# Patient Record
Sex: Female | Born: 1985
Health system: Southern US, Community
[De-identification: ages and names within clinical notes are randomized; demographics above are authoritative.]

## PROBLEM LIST (undated history)

## (undated) ENCOUNTER — Inpatient Hospital Stay (HOSPITAL_COMMUNITY): Payer: Self-pay

## (undated) VITALS — BP 142/100 | HR 102 | Temp 97.8°F | Resp 16 | Ht 67.0 in | Wt 202.5 lb

## (undated) DIAGNOSIS — G061 Intraspinal abscess and granuloma: Secondary | ICD-10-CM

## (undated) DIAGNOSIS — F988 Other specified behavioral and emotional disorders with onset usually occurring in childhood and adolescence: Secondary | ICD-10-CM

## (undated) DIAGNOSIS — I10 Essential (primary) hypertension: Secondary | ICD-10-CM

## (undated) DIAGNOSIS — M549 Dorsalgia, unspecified: Secondary | ICD-10-CM

## (undated) DIAGNOSIS — N611 Abscess of the breast and nipple: Secondary | ICD-10-CM

## (undated) DIAGNOSIS — O2300 Infections of kidney in pregnancy, unspecified trimester: Secondary | ICD-10-CM

## (undated) DIAGNOSIS — D72829 Elevated white blood cell count, unspecified: Secondary | ICD-10-CM

## (undated) DIAGNOSIS — M60221 Foreign body granuloma of soft tissue, not elsewhere classified, right upper arm: Secondary | ICD-10-CM

## (undated) DIAGNOSIS — N3000 Acute cystitis without hematuria: Secondary | ICD-10-CM

## (undated) DIAGNOSIS — F1910 Other psychoactive substance abuse, uncomplicated: Secondary | ICD-10-CM

## (undated) DIAGNOSIS — A419 Sepsis, unspecified organism: Secondary | ICD-10-CM

## (undated) DIAGNOSIS — T50902A Poisoning by unspecified drugs, medicaments and biological substances, intentional self-harm, initial encounter: Secondary | ICD-10-CM

## (undated) DIAGNOSIS — F319 Bipolar disorder, unspecified: Secondary | ICD-10-CM

## (undated) HISTORY — DX: Other specified behavioral and emotional disorders with onset usually occurring in childhood and adolescence: F98.8

## (undated) HISTORY — DX: Infections of kidney in pregnancy, unspecified trimester: O23.00

## (undated) HISTORY — DX: Acute cystitis without hematuria: N30.00

## (undated) HISTORY — PX: BACK SURGERY: SHX140

---
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
## 2004-02-22 ENCOUNTER — Emergency Department (HOSPITAL_COMMUNITY): Admission: EM | Admit: 2004-02-22 | Discharge: 2004-02-22 | Payer: Self-pay | Admitting: Emergency Medicine

## 2004-02-29 ENCOUNTER — Emergency Department (HOSPITAL_COMMUNITY): Admission: EM | Admit: 2004-02-29 | Discharge: 2004-02-29 | Payer: Self-pay

## 2004-07-17 ENCOUNTER — Ambulatory Visit (HOSPITAL_COMMUNITY): Admission: RE | Admit: 2004-07-17 | Discharge: 2004-07-17 | Payer: Self-pay | Admitting: Interventional Radiology

## 2005-04-25 ENCOUNTER — Emergency Department (HOSPITAL_COMMUNITY): Admission: EM | Admit: 2005-04-25 | Discharge: 2005-04-25 | Payer: Self-pay | Admitting: Emergency Medicine

## 2005-05-02 ENCOUNTER — Ambulatory Visit: Payer: Self-pay | Admitting: Gastroenterology

## 2005-05-03 ENCOUNTER — Ambulatory Visit: Payer: Self-pay | Admitting: Gastroenterology

## 2005-05-03 ENCOUNTER — Encounter (INDEPENDENT_AMBULATORY_CARE_PROVIDER_SITE_OTHER): Payer: Self-pay | Admitting: Specialist

## 2005-05-03 DIAGNOSIS — K5090 Crohn's disease, unspecified, without complications: Secondary | ICD-10-CM | POA: Insufficient documentation

## 2005-05-15 ENCOUNTER — Encounter: Admission: RE | Admit: 2005-05-15 | Discharge: 2005-05-15 | Payer: Self-pay | Admitting: Gastroenterology

## 2006-04-05 ENCOUNTER — Emergency Department (HOSPITAL_COMMUNITY): Admission: EM | Admit: 2006-04-05 | Discharge: 2006-04-05 | Payer: Self-pay | Admitting: Emergency Medicine

## 2006-05-06 ENCOUNTER — Inpatient Hospital Stay (HOSPITAL_COMMUNITY): Admission: AD | Admit: 2006-05-06 | Discharge: 2006-05-06 | Payer: Self-pay | Admitting: Obstetrics and Gynecology

## 2006-05-09 ENCOUNTER — Inpatient Hospital Stay (HOSPITAL_COMMUNITY): Admission: AD | Admit: 2006-05-09 | Discharge: 2006-05-09 | Payer: Self-pay | Admitting: Family Medicine

## 2006-10-22 ENCOUNTER — Inpatient Hospital Stay (HOSPITAL_COMMUNITY): Admission: AD | Admit: 2006-10-22 | Discharge: 2006-10-22 | Payer: Self-pay | Admitting: Family Medicine

## 2006-11-08 ENCOUNTER — Inpatient Hospital Stay (HOSPITAL_COMMUNITY): Admission: RE | Admit: 2006-11-08 | Discharge: 2006-11-08 | Payer: Self-pay | Admitting: Family Medicine

## 2007-01-30 ENCOUNTER — Inpatient Hospital Stay (HOSPITAL_COMMUNITY): Admission: AD | Admit: 2007-01-30 | Discharge: 2007-01-30 | Payer: Self-pay | Admitting: Obstetrics and Gynecology

## 2007-05-11 ENCOUNTER — Inpatient Hospital Stay (HOSPITAL_COMMUNITY): Admission: AD | Admit: 2007-05-11 | Discharge: 2007-05-11 | Payer: Self-pay | Admitting: Obstetrics & Gynecology

## 2007-05-15 DIAGNOSIS — O139 Gestational [pregnancy-induced] hypertension without significant proteinuria, unspecified trimester: Secondary | ICD-10-CM

## 2007-06-03 ENCOUNTER — Inpatient Hospital Stay (HOSPITAL_COMMUNITY): Admission: AD | Admit: 2007-06-03 | Discharge: 2007-06-09 | Payer: Self-pay | Admitting: Obstetrics and Gynecology

## 2007-06-03 ENCOUNTER — Ambulatory Visit: Payer: Self-pay | Admitting: Obstetrics & Gynecology

## 2007-06-17 ENCOUNTER — Inpatient Hospital Stay (HOSPITAL_COMMUNITY): Admission: AD | Admit: 2007-06-17 | Discharge: 2007-06-17 | Payer: Self-pay | Admitting: Obstetrics and Gynecology

## 2007-06-26 ENCOUNTER — Ambulatory Visit (HOSPITAL_COMMUNITY): Admission: RE | Admit: 2007-06-26 | Discharge: 2007-06-26 | Payer: Self-pay | Admitting: Obstetrics and Gynecology

## 2007-12-03 ENCOUNTER — Ambulatory Visit: Payer: Self-pay | Admitting: Gastroenterology

## 2007-12-19 ENCOUNTER — Telehealth: Payer: Self-pay | Admitting: Gastroenterology

## 2007-12-24 ENCOUNTER — Ambulatory Visit: Payer: Self-pay | Admitting: Gastroenterology

## 2008-02-18 ENCOUNTER — Telehealth: Payer: Self-pay | Admitting: Gastroenterology

## 2008-03-07 ENCOUNTER — Emergency Department (HOSPITAL_COMMUNITY): Admission: EM | Admit: 2008-03-07 | Discharge: 2008-03-07 | Payer: Self-pay | Admitting: Emergency Medicine

## 2008-03-08 ENCOUNTER — Ambulatory Visit: Payer: Self-pay | Admitting: Gastroenterology

## 2008-07-18 ENCOUNTER — Emergency Department (HOSPITAL_BASED_OUTPATIENT_CLINIC_OR_DEPARTMENT_OTHER): Admission: EM | Admit: 2008-07-18 | Discharge: 2008-07-18 | Payer: Self-pay | Admitting: Emergency Medicine

## 2008-07-18 ENCOUNTER — Ambulatory Visit: Payer: Self-pay | Admitting: Radiology

## 2008-07-31 ENCOUNTER — Emergency Department (HOSPITAL_COMMUNITY): Admission: EM | Admit: 2008-07-31 | Discharge: 2008-07-31 | Payer: Self-pay | Admitting: Emergency Medicine

## 2008-08-27 ENCOUNTER — Telehealth: Payer: Self-pay | Admitting: Gastroenterology

## 2008-09-06 ENCOUNTER — Telehealth: Payer: Self-pay | Admitting: Gastroenterology

## 2008-09-09 ENCOUNTER — Encounter: Payer: Self-pay | Admitting: Gastroenterology

## 2008-09-17 ENCOUNTER — Ambulatory Visit: Payer: Self-pay | Admitting: Diagnostic Radiology

## 2008-09-17 ENCOUNTER — Emergency Department (HOSPITAL_BASED_OUTPATIENT_CLINIC_OR_DEPARTMENT_OTHER): Admission: EM | Admit: 2008-09-17 | Discharge: 2008-09-17 | Payer: Self-pay | Admitting: Emergency Medicine

## 2009-03-03 ENCOUNTER — Inpatient Hospital Stay (HOSPITAL_COMMUNITY): Admission: AD | Admit: 2009-03-03 | Discharge: 2009-03-03 | Payer: Self-pay | Admitting: Obstetrics & Gynecology

## 2009-08-22 ENCOUNTER — Ambulatory Visit: Payer: Self-pay | Admitting: Diagnostic Radiology

## 2009-08-22 ENCOUNTER — Emergency Department (HOSPITAL_BASED_OUTPATIENT_CLINIC_OR_DEPARTMENT_OTHER): Admission: EM | Admit: 2009-08-22 | Discharge: 2009-08-22 | Payer: Self-pay | Admitting: Emergency Medicine

## 2009-11-23 ENCOUNTER — Inpatient Hospital Stay (HOSPITAL_COMMUNITY)
Admission: AD | Admit: 2009-11-23 | Discharge: 2009-11-23 | Payer: Self-pay | Source: Home / Self Care | Admitting: Obstetrics and Gynecology

## 2009-12-25 ENCOUNTER — Ambulatory Visit (HOSPITAL_COMMUNITY)
Admission: AD | Admit: 2009-12-25 | Discharge: 2009-12-25 | Payer: Self-pay | Source: Home / Self Care | Admitting: Obstetrics and Gynecology

## 2009-12-25 DIAGNOSIS — O034 Incomplete spontaneous abortion without complication: Secondary | ICD-10-CM

## 2010-04-10 ENCOUNTER — Ambulatory Visit: Payer: Self-pay | Admitting: Diagnostic Radiology

## 2010-04-10 ENCOUNTER — Emergency Department (HOSPITAL_BASED_OUTPATIENT_CLINIC_OR_DEPARTMENT_OTHER)
Admission: EM | Admit: 2010-04-10 | Discharge: 2010-04-10 | Payer: Self-pay | Source: Home / Self Care | Admitting: Emergency Medicine

## 2010-04-15 ENCOUNTER — Emergency Department (HOSPITAL_BASED_OUTPATIENT_CLINIC_OR_DEPARTMENT_OTHER)
Admission: EM | Admit: 2010-04-15 | Discharge: 2010-04-15 | Payer: Self-pay | Source: Home / Self Care | Admitting: Emergency Medicine

## 2010-07-28 LAB — URINALYSIS, ROUTINE W REFLEX MICROSCOPIC
Bilirubin Urine: NEGATIVE
Glucose, UA: NEGATIVE mg/dL
Ketones, ur: NEGATIVE mg/dL
pH: 7 (ref 5.0–8.0)

## 2010-07-28 LAB — URINE MICROSCOPIC-ADD ON

## 2010-07-28 LAB — ABO/RH: ABO/RH(D): O POS

## 2010-07-28 LAB — CBC
HCT: 34.5 % — ABNORMAL LOW (ref 36.0–46.0)
Hemoglobin: 11.9 g/dL — ABNORMAL LOW (ref 12.0–15.0)
WBC: 12.9 10*3/uL — ABNORMAL HIGH (ref 4.0–10.5)

## 2010-07-30 LAB — URINALYSIS, ROUTINE W REFLEX MICROSCOPIC
Glucose, UA: NEGATIVE mg/dL
Nitrite: NEGATIVE
Protein, ur: NEGATIVE mg/dL
pH: 5.5 (ref 5.0–8.0)

## 2010-07-30 LAB — CBC
HCT: 38.3 % (ref 36.0–46.0)
Hemoglobin: 13.3 g/dL (ref 12.0–15.0)
MCV: 96.6 fL (ref 78.0–100.0)
RDW: 12.4 % (ref 11.5–15.5)
WBC: 11.5 10*3/uL — ABNORMAL HIGH (ref 4.0–10.5)

## 2010-07-30 LAB — WET PREP, GENITAL
Trich, Wet Prep: NONE SEEN
Yeast Wet Prep HPF POC: NONE SEEN

## 2010-07-30 LAB — ABO/RH: ABO/RH(D): O POS

## 2010-07-30 LAB — GC/CHLAMYDIA PROBE AMP, GENITAL: GC Probe Amp, Genital: NEGATIVE

## 2010-08-24 LAB — BASIC METABOLIC PANEL
BUN: 9 mg/dL (ref 6–23)
Calcium: 9.6 mg/dL (ref 8.4–10.5)
Chloride: 112 mEq/L (ref 96–112)
GFR calc Af Amer: >60 mL/min (ref >60)
GFR calc Af Amer: >60 mL/min (ref >60)
GFR calc non Af Amer: >60 mL/min (ref >60)
GFR calc non Af Amer: >60 mL/min (ref >60)
Potassium: 4.2 mEq/L (ref 3.5–5.1)
Potassium: 4.1 mEq/L (ref 3.5–5.1)
Sodium: 138 mEq/L (ref 135–145)
Sodium: 147 mEq/L — ABNORMAL HIGH (ref 135–145)

## 2010-08-24 LAB — URINALYSIS, ROUTINE W REFLEX MICROSCOPIC
Bilirubin Urine: NEGATIVE
Glucose, UA: NEGATIVE mg/dL
Hgb urine dipstick: NEGATIVE
Ketones, ur: NEGATIVE mg/dL
Nitrite: NEGATIVE
Protein, ur: NEGATIVE mg/dL
Specific Gravity, Urine: 1.004 — ABNORMAL LOW (ref 1.005–1.030)
Urobilinogen, UA: 0.2 mg/dL (ref 0.0–1.0)
pH: 5.5 (ref 5.0–8.0)

## 2010-08-24 LAB — WOUND CULTURE
Culture: NO GROWTH
Gram Stain: NONE SEEN

## 2010-08-24 LAB — PREGNANCY, URINE: Preg Test, Ur: NEGATIVE

## 2010-08-31 ENCOUNTER — Emergency Department (HOSPITAL_BASED_OUTPATIENT_CLINIC_OR_DEPARTMENT_OTHER)
Admission: EM | Admit: 2010-08-31 | Discharge: 2010-08-31 | Disposition: A | Discharge status: Home / Self Care | Payer: 59 | Attending: Emergency Medicine | Admitting: Emergency Medicine

## 2010-08-31 ENCOUNTER — Emergency Department (INDEPENDENT_AMBULATORY_CARE_PROVIDER_SITE_OTHER): Payer: 59

## 2010-08-31 DIAGNOSIS — F988 Other specified behavioral and emotional disorders with onset usually occurring in childhood and adolescence: Secondary | ICD-10-CM | POA: Insufficient documentation

## 2010-08-31 DIAGNOSIS — W108XXA Fall (on) (from) other stairs and steps, initial encounter: Secondary | ICD-10-CM | POA: Insufficient documentation

## 2010-08-31 DIAGNOSIS — K5090 Crohn's disease, unspecified, without complications: Secondary | ICD-10-CM | POA: Insufficient documentation

## 2010-08-31 DIAGNOSIS — M25569 Pain in unspecified knee: Secondary | ICD-10-CM

## 2010-08-31 DIAGNOSIS — IMO0002 Reserved for concepts with insufficient information to code with codable children: Secondary | ICD-10-CM | POA: Insufficient documentation

## 2010-08-31 DIAGNOSIS — Y92009 Unspecified place in unspecified non-institutional (private) residence as the place of occurrence of the external cause: Secondary | ICD-10-CM | POA: Insufficient documentation

## 2010-09-01 ENCOUNTER — Ambulatory Visit: Payer: 59 | Attending: Orthopaedic Surgery | Admitting: Physical Therapy

## 2010-09-03 ENCOUNTER — Emergency Department (HOSPITAL_COMMUNITY)
Admission: EM | Admit: 2010-09-03 | Discharge: 2010-09-03 | Disposition: A | Discharge status: Home / Self Care | Payer: 59 | Attending: Emergency Medicine | Admitting: Emergency Medicine

## 2010-09-03 ENCOUNTER — Emergency Department (HOSPITAL_COMMUNITY): Payer: 59

## 2010-09-03 DIAGNOSIS — M25569 Pain in unspecified knee: Secondary | ICD-10-CM | POA: Insufficient documentation

## 2010-09-03 DIAGNOSIS — W19XXXA Unspecified fall, initial encounter: Secondary | ICD-10-CM | POA: Insufficient documentation

## 2010-09-03 DIAGNOSIS — Y92009 Unspecified place in unspecified non-institutional (private) residence as the place of occurrence of the external cause: Secondary | ICD-10-CM | POA: Insufficient documentation

## 2010-09-04 ENCOUNTER — Other Ambulatory Visit (HOSPITAL_COMMUNITY): Payer: Self-pay | Admitting: Specialist

## 2010-09-04 ENCOUNTER — Ambulatory Visit (HOSPITAL_COMMUNITY)
Admission: RE | Admit: 2010-09-04 | Discharge: 2010-09-04 | Disposition: A | Discharge status: Home / Self Care | Payer: 59 | Source: Ambulatory Visit | Attending: Specialist | Admitting: Specialist

## 2010-09-04 DIAGNOSIS — R52 Pain, unspecified: Secondary | ICD-10-CM

## 2010-09-04 DIAGNOSIS — W19XXXA Unspecified fall, initial encounter: Secondary | ICD-10-CM | POA: Insufficient documentation

## 2010-09-04 DIAGNOSIS — S82109A Unspecified fracture of upper end of unspecified tibia, initial encounter for closed fracture: Secondary | ICD-10-CM | POA: Insufficient documentation

## 2010-09-05 ENCOUNTER — Other Ambulatory Visit (HOSPITAL_COMMUNITY): Payer: Self-pay | Admitting: Specialist

## 2010-09-05 DIAGNOSIS — S83519A Sprain of anterior cruciate ligament of unspecified knee, initial encounter: Secondary | ICD-10-CM

## 2010-09-05 DIAGNOSIS — M25562 Pain in left knee: Secondary | ICD-10-CM

## 2010-09-26 NOTE — Op Note (Signed)
Kelly [name], Kelly [name]            ACCOUNT NO.:  [id number]   MEDICAL RECORD NO.:  [id number]          PATIENT TYPE:  INP   LOCATION:  9121                          FACILITY:  [facility]   PHYSICIAN:  Daleen Bo. Gaetano Net, M.D. DATE OF BIRTH:  [birth year]   DATE OF PROCEDURE:  06/05/2007  DATE OF DISCHARGE:  06/09/2007                               OPERATIVE REPORT   PREOPERATIVE DIAGNOSIS:  1. Intrauterine pregnancy at 40 weeks estimated gestational age.  2. Arrest of cervical dilation.   POSTOPERATIVE DIAGNOSIS:  1. Intrauterine pregnancy at 60 weeks estimated gestational age.  2. Arrest of cervical dilation.   PROCEDURE:  Low-transverse cesarean section.   SURGEON:  Dr. Everlene Farrier.   ANESTHESIA:  Spinal anesthetic, Josephine Igo, MD.   ESTIMATED BLOOD LOSS:  800 mL.   FINDINGS:  Viable female infant, Apgars of 8 and 9 at 1 and 5 minutes  respectively.  Birth weight 7 pounds 14 ounces.  Arterial cord pH 7.31.   INDICATIONS AND CONSENTS:  This patient is a 25 year old, G2, P0 with an  EDC of 127 who presents for induction of labor secondary to back pain  from Harrington rods.  She had also been noted to have mild elevations  of blood pressure.  She underwent a two-stage induction of labor.  Artificial rupture of membranes was carried out for clear fluid on  June 05, 2007 at 1:45 p.m.  An intrauterine pressure catheter was  placed.  She progressed to 4 cm dilation,  90% effacement, -2 station.  With adequate labor, there was no change after at least 3 hours of  labor. Diagnosis of arrest of cervical dilation was made.  A  recommendation for cesarean section was made.  The potential risks and  complications were discussed preoperatively including but not limited to  infection, organ damage, bleeding with transfusion, HIV and hepatitis  acquisition, DVT, PE, pneumonia.  All questions were answered and  consent is signed on the chart.   DESCRIPTION OF PROCEDURE:  The patient is  taken to operating room where  she is identified, spinal anesthetic is placed and she is placed in the  dorsal supine position with a 15 degree left lateral wedge.  She is  prepped, Foley catheter is placed, the bladder to drained and she is  draped in a sterile fashion.  After testing for adequate spinal  anesthesia, the skin is entered through a Pfannenstiel incision and  dissection is carried out in layers to the peritoneum.  The peritoneum  is incised and extended superiorly and inferiorly. The vesicouterine  peritoneum is taken down cephalolaterally, the bladder flap is  developed, the bladder blade is placed.  The uterus is incised in a low  transverse manner.  The uterine cavity is entered bluntly with a  hemostat.  The uterine incision is extended cephalolaterally with the  fingers.  The Vertex is then delivered and the oronasal pharynx are  suctioned.  The remainder of the baby is delivered.  Good cry and tone  is noted and the baby is handed to the waiting pediatric team. The  placenta is manually delivered and  the uterus is clean.  The uterus is  closed in two running locking imbricating layers of #0 Monocryl suture  which achieves good hemostasis.  The tubes and ovaries are normal  bilaterally.  The anterior peritoneum is was closed in running fashion  with #0 Monocryl suture which was also used to reapproximate the  pyramidalis muscle in the midline.  The anterior rectus fascia is closed  in running fashion with a #0 PDS suture and the skin is closed with  clips.  All sponge, instrument and needle counts are correct and the  patient is transferred to the recovery room in stable condition.      Daleen Bo Gaetano Net, M.D.  Electronically Signed     JET/MEDQ  D:  07/21/2007  T:  07/22/2007  Job:  [id number]

## 2010-09-26 NOTE — Letter (Signed)
September 09, 2008     RE:  Kelly [name], Kelly [name]  MRN:  [id number]  /  DOB:  Oct 21, 1985   To Whom It May Concern:   Ms. Hellickson is under my care for Crohn's disease.  This is a chronic  condition characterized by episodic flare-ups of colitis.  During these  episodes the patient may become partially incapacitated because of  abdominal pain and diarrhea.  It may be expected that she will be unable  to attend school during these episodes.   Please provide any accommodation that she may require.    Sincerely,      Sandy Salaam. Deatra Ina, MD,FACG  Electronically Signed    RDK/MedQ  DD: 09/09/2008  DT: 09/09/2008  Job #: [id number]
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
## 2010-09-29 NOTE — Discharge Summary (Signed)
Kelly [name], Kelly [name]            ACCOUNT NO.:  [id number]   MEDICAL RECORD NO.:  [id number]          PATIENT TYPE:  INP   LOCATION:  9121                          FACILITY:  [facility]   PHYSICIAN:  Marylynn [name], MD    DATE OF BIRTH:  07-May-1986   DATE OF ADMISSION:  06/03/2007  DATE OF DISCHARGE:  06/09/2007                               DISCHARGE SUMMARY   ADMISSION DIAGNOSES:  1. Intrauterine pregnancy at term.  2. Induction of labor secondary to back pain from history of      Harrington rod placement.   DISCHARGE DIAGNOSES:  1. Status post low transverse cesarean section secondary to failure to      progress.  2. Viable female infant.   PROCEDURE:  Primary low transverse cesarean section.   REASON FOR ADMISSION:  Please see written H&P.   HOSPITAL COURSE:  The patient is 21-year, gravida 2, para 0, that was  admitted to [facility] for induction of labor secondary  to low back pain and history of Harrington rod placement. On the morning  of admission, vital signs were stable with blood pressure 150/80.  Fetal  heart tones were reactive with heart rate of 142.  Cervix was closed on  admission, vertex presentation. Anesthesia was called in for epidural  placement which was placed without difficulty. The patient did progress  through her labor, and artificial rupture of membranes was performed  which revealed clear fluid.  Intrauterine pressure catheter was placed  for monitoring of her labor.  The patient was started on Pitocin.  However, she never progressed beyond 4 cm in dilatation.   The patient was then transferred to the operating room where epidural  was dosed to an adequate surgical level.  Low transverse incision was  made with delivery of a viable female infant weighing 7 pounds 14 ounces,  Apgars of 8 at 1 minute and 9 at 5 minutes.  The patient tolerated  procedure well and taken to the recovery room in stable condition.   On postoperative day #1, the  patient was without complaint.  Vital signs  were stable.  She was afebrile.  Abdomen soft with good return of bowel  function.   On postoperative day #2, the patient did complain of some gas pain.  Abdomen slightly distended, but she had good bowel sounds.  Abdominal  dressing had been removed revealing an incision that was clean, dry and  intact.  The patient was ambulating well, and rectal suppository was  given.   On postoperative day #3, the patient did continue to complain of some  low abdominal pain.  She was breast feeding, complaining of some nipple  soreness.  Vital signs were stable.  Abdomen was soft, slightly tender.  Uterus was firm.  Incision was clean, dry and intact.   On postoperative day #4, the patient stated that the pain medication  seemed to be working better.  She was now passing gas without  difficulty.  Incision was clean, dry and intact.  Staples were removed,  and the patient was later discharged home.   CONDITION ON DISCHARGE:  Good.  DIET:  Regular as tolerated.   ACTIVITY:  No heavy lifting, no driving x2 weeks.  No vaginal entry.   FOLLOWUP:  Patient to follow up in the office in 1-2 weeks for incision  check.  She is to call for temperature greater than 100 degrees,  persistent nausea, vomiting, heavy vaginal bleeding and/or redness or  drainage from incisional site.   DISCHARGE MEDICATIONS:  1. Tylox, #30, one p.o. every 4-6 hours p.r.n.  2. Motrin 600 mg every 6 hours.  3. Prenatal vitamins one p.o. daily.  4. Colace 1 p.o. daily.      Kelly [name], N.P.      Marylynn [name], MD  Electronically Signed    CC/MEDQ  D:  06/27/2007  T:  06/28/2007  Job:  [id number]

## 2011-02-01 LAB — COMPREHENSIVE METABOLIC PANEL
AST: 22
CO2: 20
Calcium: 9.7
Creatinine, Ser: 0.58
GFR calc Af Amer: >60
GFR calc non Af Amer: >60
Glucose, Bld: 108 — ABNORMAL HIGH
Sodium: 133 — ABNORMAL LOW
Total Protein: 5.9 — ABNORMAL LOW

## 2011-02-01 LAB — CBC
HCT: 34 — ABNORMAL LOW
Hemoglobin: 11.9 — ABNORMAL LOW
Hemoglobin: 12.8
Hemoglobin: 9.7 — ABNORMAL LOW
MCHC: 34.3
MCHC: 34.7
MCV: 92.6
Platelets: 234
Platelets: 277
Platelets: 346
RDW: 13.7
RDW: 13.7
RDW: 13.8

## 2011-02-01 LAB — URIC ACID: Uric Acid, Serum: 6.1

## 2011-02-02 LAB — COMPREHENSIVE METABOLIC PANEL
ALT: 12
AST: 21
Alkaline Phosphatase: 115
CO2: 24
Calcium: 9.2
GFR calc Af Amer: >60
Potassium: 3.8
Sodium: 140
Total Protein: 7

## 2011-02-02 LAB — CBC
MCHC: 34.4
RBC: 3.44 — ABNORMAL LOW
RDW: 13.6

## 2011-02-02 LAB — URINALYSIS, DIPSTICK ONLY
Glucose, UA: NEGATIVE
Ketones, ur: NEGATIVE
Protein, ur: 100 — AB
pH: 5.5

## 2011-02-12 LAB — URINALYSIS, ROUTINE W REFLEX MICROSCOPIC
Bilirubin Urine: NEGATIVE
Glucose, UA: NEGATIVE
Hgb urine dipstick: NEGATIVE
Nitrite: NEGATIVE
Specific Gravity, Urine: 1.019
pH: 6.5

## 2011-02-12 LAB — POCT PREGNANCY, URINE: Preg Test, Ur: NEGATIVE

## 2011-02-12 LAB — URINE MICROSCOPIC-ADD ON

## 2011-02-22 LAB — URINALYSIS, ROUTINE W REFLEX MICROSCOPIC
Bilirubin Urine: NEGATIVE
Nitrite: NEGATIVE
Specific Gravity, Urine: 1.015
Urobilinogen, UA: 0.2

## 2011-03-01 LAB — GC/CHLAMYDIA PROBE AMP, GENITAL
Chlamydia, DNA Probe: NEGATIVE
GC Probe Amp, Genital: NEGATIVE

## 2011-03-01 LAB — URINALYSIS, ROUTINE W REFLEX MICROSCOPIC
Hgb urine dipstick: NEGATIVE
Protein, ur: NEGATIVE
Urobilinogen, UA: 0.2

## 2011-03-01 LAB — WET PREP, GENITAL: Trich, Wet Prep: NONE SEEN

## 2011-03-01 LAB — POCT PREGNANCY, URINE: Preg Test, Ur: POSITIVE

## 2011-03-27 ENCOUNTER — Emergency Department (INDEPENDENT_AMBULATORY_CARE_PROVIDER_SITE_OTHER): Payer: Medicaid Other

## 2011-03-27 ENCOUNTER — Encounter: Payer: Self-pay | Admitting: *Deleted

## 2011-03-27 ENCOUNTER — Emergency Department (HOSPITAL_BASED_OUTPATIENT_CLINIC_OR_DEPARTMENT_OTHER)
Admission: EM | Admit: 2011-03-27 | Discharge: 2011-03-27 | Disposition: A | Discharge status: Home / Self Care | Payer: Medicaid Other | Attending: Emergency Medicine | Admitting: Emergency Medicine

## 2011-03-27 DIAGNOSIS — N760 Acute vaginitis: Secondary | ICD-10-CM | POA: Insufficient documentation

## 2011-03-27 DIAGNOSIS — R1032 Left lower quadrant pain: Secondary | ICD-10-CM

## 2011-03-27 DIAGNOSIS — R112 Nausea with vomiting, unspecified: Secondary | ICD-10-CM

## 2011-03-27 DIAGNOSIS — A499 Bacterial infection, unspecified: Secondary | ICD-10-CM | POA: Insufficient documentation

## 2011-03-27 DIAGNOSIS — B9689 Other specified bacterial agents as the cause of diseases classified elsewhere: Secondary | ICD-10-CM | POA: Insufficient documentation

## 2011-03-27 DIAGNOSIS — R109 Unspecified abdominal pain: Secondary | ICD-10-CM

## 2011-03-27 DIAGNOSIS — K8020 Calculus of gallbladder without cholecystitis without obstruction: Secondary | ICD-10-CM

## 2011-03-27 HISTORY — DX: Essential (primary) hypertension: I10

## 2011-03-27 LAB — WET PREP, GENITAL
Trich, Wet Prep: NONE SEEN
Yeast Wet Prep HPF POC: NONE SEEN

## 2011-03-27 LAB — BASIC METABOLIC PANEL
BUN: 11 mg/dL (ref 6–23)
CO2: 28 mEq/L (ref 19–32)
Calcium: 10.5 mg/dL (ref 8.4–10.5)
GFR calc non Af Amer: >90 mL/min (ref >90)
Glucose, Bld: 99 mg/dL (ref 70–99)
Sodium: 137 mEq/L (ref 135–145)

## 2011-03-27 LAB — URINALYSIS, ROUTINE W REFLEX MICROSCOPIC
Bilirubin Urine: NEGATIVE
Hgb urine dipstick: NEGATIVE
Protein, ur: NEGATIVE mg/dL
Urobilinogen, UA: 0.2 mg/dL (ref 0.0–1.0)

## 2011-03-27 LAB — CBC
HCT: 41.7 % (ref 36.0–46.0)
Hemoglobin: 14 g/dL (ref 12.0–15.0)
MCH: 32.3 pg (ref 26.0–34.0)
MCHC: 33.6 g/dL (ref 30.0–36.0)
MCV: 96.3 fL (ref 78.0–100.0)
RBC: 4.33 MIL/uL (ref 3.87–5.11)

## 2011-03-27 MED ORDER — IOHEXOL 300 MG/ML  SOLN
100.0000 mL | Freq: Once | INTRAMUSCULAR | Status: AC | PRN
  Administered 2011-03-27: 100 mL via INTRAVENOUS

## 2011-03-27 MED ORDER — MORPHINE SULFATE 4 MG/ML IJ SOLN
4.0000 mg | Freq: Once | INTRAMUSCULAR | Status: AC
  Administered 2011-03-27: 4 mg via INTRAVENOUS
  Filled 2011-03-27: qty 1

## 2011-03-27 MED ORDER — METRONIDAZOLE 500 MG PO TABS: 500.0000 mg | ORAL_TABLET | Freq: Two times a day (BID) | ORAL | Status: AC

## 2011-03-27 MED ORDER — OXYCODONE-ACETAMINOPHEN 5-325 MG PO TABS: 2.0000 | ORAL_TABLET | ORAL | Status: AC | PRN

## 2011-03-27 MED ORDER — ONDANSETRON HCL 4 MG/2ML IJ SOLN
4.0000 mg | Freq: Once | INTRAMUSCULAR | Status: AC
  Administered 2011-03-27: 4 mg via INTRAVENOUS
  Filled 2011-03-27: qty 2

## 2011-03-27 MED ORDER — OXYCODONE-ACETAMINOPHEN 5-325 MG PO TABS
1.0000 | ORAL_TABLET | Freq: Once | ORAL | Status: AC
  Administered 2011-03-27: 1 via ORAL
  Filled 2011-03-27: qty 1

## 2011-03-27 NOTE — ED Notes (Signed)
In ultrasound

## 2011-03-27 NOTE — ED Notes (Signed)
Pt reports she has had ovarian cysts in the past and this feels similar to that states she has vomited a couple of times today but she feels like it is due to the pain

## 2011-03-27 NOTE — ED Notes (Signed)
0500 yesterday pt woke with severe lower left abdominal pain reports nausea and vomiting x 2 today denies diarrhea . Denies any s/s uti does report vaginal discharge

## 2011-03-27 NOTE — ED Provider Notes (Signed)
History     CSN: [id number] Arrival date & time: 03/27/2011  1:18 PM   First MD Initiated Contact with Patient 03/27/11 1331      Chief Complaint  Patient presents with  . Abdominal Pain    (Consider location/radiation/quality/duration/timing/severity/associated sxs/prior treatment) HPI Comments: Pt states that they thought she had crohns but she doesn't:pt states that this feels similar to previous cysts  Patient is a 25 y.o. female presenting with abdominal pain. The history is provided by the patient. No language interpreter was used.  Abdominal Pain The primary symptoms of the illness include abdominal pain, nausea and vomiting. The primary symptoms of the illness do not include dysuria or vaginal discharge. The current episode started yesterday. The onset of the illness was sudden. The problem has not changed since onset. The patient states that she believes she is currently not pregnant. The patient has not had a change in bowel habit. Symptoms associated with the illness do not include urgency or frequency.    Past Medical History  Diagnosis Date  . Hypertension     Past Surgical History  Procedure Date  . Cesarean section   . Back surgery     History reviewed. No pertinent family history.  History  Substance Use Topics  . Smoking status: Current Everyday Smoker -- 0.5 packs/day  . Smokeless tobacco: Never Used  . Alcohol Use: Yes     occ    OB History    Grav Para Term Preterm Abortions TAB SAB Ect Mult Living                  Review of Systems  Gastrointestinal: Positive for nausea, vomiting and abdominal pain.  Genitourinary: Negative for dysuria, urgency, frequency and vaginal discharge.  All other systems reviewed and are negative.    Allergies  Review of patient's allergies indicates no known allergies.  Home Medications  No current outpatient prescriptions on file.  BP 131/78  Pulse 73  Temp(Src) 97.9 F (36.6 C) (Oral)  Resp 20  Wt  180 lb (81.647 kg)  SpO2 100%  LMP 03/14/2011  Physical Exam  Nursing note and vitals reviewed. Constitutional: She is oriented to person, place, and time. She appears well-developed and well-nourished.  HENT:  Head: Normocephalic and atraumatic.  Cardiovascular: Normal rate and regular rhythm.   Pulmonary/Chest: Effort normal and breath sounds normal.  Abdominal: Soft. There is tenderness in the left lower quadrant.  Genitourinary: Vaginal discharge found.       White vaginal discharge  Musculoskeletal: Normal range of motion.  Neurological: She is alert and oriented to person, place, and time.  Skin: Skin is warm and dry.  Psychiatric: She has a normal mood and affect.    ED Course  Procedures (including critical care time)  Labs Reviewed  URINALYSIS, ROUTINE W REFLEX MICROSCOPIC - Abnormal; Notable for the following:    Ketones, ur 15 (*)    All other components within normal limits  WET PREP, GENITAL - Abnormal; Notable for the following:    Clue Cells, Wet Prep MANY (*)    WBC, Wet Prep HPF POC MODERATE (*)    All other components within normal limits  CBC - Abnormal; Notable for the following:    WBC 11.2 (*)    Platelets 407 (*)    All other components within normal limits  PREGNANCY, URINE  BASIC METABOLIC PANEL  GC/CHLAMYDIA PROBE AMP, GENITAL  RPR   US Transvaginal Non-ob  03/27/2011  *RADIOLOGY REPORT*  Clinical  Data: Left lower quadrant pain for 1 day with nausea and vomiting.  TRANSABDOMINAL AND TRANSVAGINAL ULTRASOUND OF PELVIS Technique:  Both transabdominal and transvaginal ultrasound examinations of the pelvis were performed. Transabdominal technique was performed for global imaging of the pelvis including uterus, ovaries, adnexal regions, and pelvic cul-de-sac.  Comparison: Pelvic ultrasound 12/25/2009   It was necessary to proceed with endovaginal exam following the transabdominal exam to visualize the uterus, endometrium, ovaries and adnexa.  Findings:   Uterus: Normal in size and appearance.  Anteverted.  Measures 5.7 x 3.3 x 4.1 cm.  No focal uterine mass.  Endometrium: Normal in thickness and appearance.  Measures 7 mm in thickness.  Right ovary:  Normal appearance/no adnexal mass.  3.0 x 2.0 x 2.2 cm.  Left ovary: Normal appearance/no adnexal mass.  3.3 x 2.3 x 2.3 cm.  Other findings: No free fluid  IMPRESSION: Normal study. No evidence of pelvic mass or other significant abnormality.  Original Report Authenticated By: Curlene Dolphin, M.D.   US Pelvis Complete  03/27/2011  *RADIOLOGY REPORT*  Clinical Data: Left lower quadrant pain for 1 day with nausea and vomiting.  TRANSABDOMINAL AND TRANSVAGINAL ULTRASOUND OF PELVIS Technique:  Both transabdominal and transvaginal ultrasound examinations of the pelvis were performed. Transabdominal technique was performed for global imaging of the pelvis including uterus, ovaries, adnexal regions, and pelvic cul-de-sac.  Comparison: Pelvic ultrasound 12/25/2009   It was necessary to proceed with endovaginal exam following the transabdominal exam to visualize the uterus, endometrium, ovaries and adnexa.  Findings:  Uterus: Normal in size and appearance.  Anteverted.  Measures 5.7 x 3.3 x 4.1 cm.  No focal uterine mass.  Endometrium: Normal in thickness and appearance.  Measures 7 mm in thickness.  Right ovary:  Normal appearance/no adnexal mass.  3.0 x 2.0 x 2.2 cm.  Left ovary: Normal appearance/no adnexal mass.  3.3 x 2.3 x 2.3 cm.  Other findings: No free fluid  IMPRESSION: Normal study. No evidence of pelvic mass or other significant abnormality.  Original Report Authenticated By: Curlene Dolphin, M.D.   Ct Abdomen Pelvis W Contrast  03/27/2011  *RADIOLOGY REPORT*  Clinical Data: Abdominal pain.  CT ABDOMEN AND PELVIS WITH CONTRAST  Technique:  Multidetector CT imaging of the abdomen and pelvis was performed following the standard protocol during bolus administration of intravenous contrast.  Contrast: 17m OMNIPAQUE  IOHEXOL 300 MG/ML IV SOLN  Comparison: CT scan 07/31/2008.  Findings: The lung bases are clear.  The liver is normal.  No focal lesions or biliary dilatation.  The mild diffuse fatty infiltration is suspected.  The spleen is normal in size.  No focal lesions.  The pancreas is normal.  The adrenal glands and kidneys are normal.  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  The gallbladder demonstrates numerous gallstones.  No pericholecystic inflammatory changes, wall thickening or pericholecystic fluid to suggest acute cholecystitis.  The stomach, duodenum, small bowel and colon are grossly normal. The appendix is normal.  The uterus and ovaries are unremarkable except for small cysts. The bladder is normal.  No pelvic mass, adenopathy or free pelvic fluid collections.  The aorta is normal in caliber.  The major branch vessels are normal.  No mesenteric or retroperitoneal masses or lymphadenopathy.  The bony structures are intact.  Surgical changes related to a scoliosis and Harrington rods.  No obvious complicating features.  IMPRESSION:  1.  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 2.  No acute abdominal/pelvic findings, mass lesions or  adenopathy. 3.  Cholelithiasis without CT findings to suggest acute cholecystitis.  Original Report Authenticated By: P. Kalman Jewels, M.D.     1. Bacterial vaginosis   2. Abdominal pain       MDM  No acute findings noted:will treat with something for pain and bv        Glendell Docker, NP 03/27/11 1745

## 2011-03-29 NOTE — ED Provider Notes (Signed)
Medical screening examination/treatment/procedure(s) were performed by non-physician practitioner and as supervising physician I was immediately available for consultation/collaboration.  Carlisle Beers, MD 03/29/11 [phone]

## 2011-07-18 ENCOUNTER — Emergency Department (HOSPITAL_COMMUNITY)
Admission: EM | Admit: 2011-07-18 | Discharge: 2011-07-18 | Disposition: A | Discharge status: Home / Self Care | Payer: Medicaid Other | Attending: Emergency Medicine | Admitting: Emergency Medicine

## 2011-07-18 ENCOUNTER — Encounter (HOSPITAL_COMMUNITY): Payer: Self-pay | Admitting: Family Medicine

## 2011-07-18 DIAGNOSIS — I10 Essential (primary) hypertension: Secondary | ICD-10-CM | POA: Insufficient documentation

## 2011-07-18 DIAGNOSIS — N3090 Cystitis, unspecified without hematuria: Secondary | ICD-10-CM | POA: Insufficient documentation

## 2011-07-18 DIAGNOSIS — F17200 Nicotine dependence, unspecified, uncomplicated: Secondary | ICD-10-CM | POA: Insufficient documentation

## 2011-07-18 LAB — URINALYSIS, ROUTINE W REFLEX MICROSCOPIC
Bilirubin Urine: NEGATIVE
Glucose, UA: NEGATIVE mg/dL
Protein, ur: 100 mg/dL — AB
Urobilinogen, UA: 1 mg/dL (ref 0.0–1.0)

## 2011-07-18 LAB — URINE MICROSCOPIC-ADD ON

## 2011-07-18 MED ORDER — CEPHALEXIN 500 MG PO CAPS: 500.0000 mg | ORAL_CAPSULE | Freq: Four times a day (QID) | ORAL | Status: AC

## 2011-07-18 MED ORDER — PHENAZOPYRIDINE HCL 200 MG PO TABS: 200.0000 mg | ORAL_TABLET | Freq: Three times a day (TID) | ORAL | Status: AC

## 2011-07-18 NOTE — ED Notes (Signed)
Per pt woke up this am and having hematuria and dysuria

## 2011-07-18 NOTE — ED Provider Notes (Signed)
History     CSN: [id number]  Arrival date & time 07/18/11  7829   First MD Initiated Contact with Patient 07/18/11 1105      Chief Complaint  Patient presents with  . Hematuria     Patient is a 26 y.o. female presenting with dysuria.  Dysuria  This is a new problem. The current episode started 6 to 12 hours ago. The problem occurs every urination. The problem has been gradually worsening. The quality of the pain is described as burning. The pain is mild. There has been no fever. Associated symptoms include chills, frequency, hematuria and urgency. Pertinent negatives include no vomiting, no possible pregnancy and no flank pain. She has tried nothing for the symptoms. Her past medical history does not include kidney stones.  she does not believe she is pregnant No vaginal bleeding   Past Medical History  Diagnosis Date  . Hypertension     Past Surgical History  Procedure Date  . Cesarean section   . Back surgery     History reviewed. No pertinent family history.  History  Substance Use Topics  . Smoking status: Current Everyday Smoker -- 0.5 packs/day  . Smokeless tobacco: Never Used  . Alcohol Use: Yes     occ    OB History    Grav Para Term Preterm Abortions TAB SAB Ect Mult Living                  Review of Systems  Constitutional: Positive for chills.  Gastrointestinal: Negative for vomiting.  Genitourinary: Positive for dysuria, urgency, frequency and hematuria. Negative for flank pain.    Allergies  Hydrocodone  Home Medications   Current Outpatient Rx  Name Route Sig Dispense Refill  . AMPHETAMINE-DEXTROAMPHETAMINE 10 MG PO TABS Oral Take 10 mg by mouth 3 (three) times daily as needed.    . AMPHETAMINE-DEXTROAMPHETAMINE 30 MG PO TABS Oral Take 30 mg by mouth 2 (two) times daily.    . TAB-A-VITE/IRON PO TABS Oral Take 1 tablet by mouth daily.    Marland Kitchen NORGESTIM-ETH ESTRAD TRIPHASIC 0.18/0.215/0.25 MG-25 MCG PO TABS Oral Take 1 tablet by mouth daily.      . OXYCODONE HCL 5 MG PO TABS Oral Take 5 mg by mouth every 4 (four) hours as needed.    . CEPHALEXIN 500 MG PO CAPS Oral Take 1 capsule (500 mg total) by mouth 4 (four) times daily. 28 capsule 0  . PHENAZOPYRIDINE HCL 200 MG PO TABS Oral Take 1 tablet (200 mg total) by mouth 3 (three) times daily. 6 tablet 0    BP 133/87  Temp 97.7 F (36.5 C)  Resp 14  SpO2 100%  LMP 07/11/2011  Physical Exam CONSTITUTIONAL: Well developed/well nourished HEAD AND FACE: Normocephalic/atraumatic EYES: EOMI/PERRL ENMT: Mucous membranes moist NECK: supple no meningeal signs CV: S1/S2 noted, no murmurs/rubs/gallops noted LUNGS: Lungs are clear to auscultation bilaterally, no apparent distress ABDOMEN: soft, suprapubic tenderness, no rebound or guarding GU:no cva tenderness NEURO: Pt is awake/alert, moves all extremitiesx4 EXTREMITIES: pulses normal, full ROM SKIN: warm, color normal PSYCH: no abnormalities of mood noted  ED Course  Procedures   Labs Reviewed  URINALYSIS, ROUTINE W REFLEX MICROSCOPIC - Abnormal; Notable for the following:    APPearance CLOUDY (*)    Hgb urine dipstick LARGE (*)    Protein, ur 100 (*)    Leukocytes, UA LARGE (*)    All other components within normal limits  URINE MICROSCOPIC-ADD ON - Abnormal; Notable for the  following:    Squamous Epithelial / LPF FEW (*)    Bacteria, UA FEW (*)    All other components within normal limits     1. Cystitis    The patient appears reasonably screened and/or stabilized for discharge and I doubt any other medical condition or other [facility] requiring further screening, evaluation, or treatment in the ED at this time prior to discharge.    MDM  Nursing notes reviewed and considered in documentation All labs/vitals reviewed and considered         Sharyon Cable, MD 07/18/11 1130

## 2011-07-18 NOTE — Discharge Instructions (Signed)

## 2012-01-31 ENCOUNTER — Encounter (HOSPITAL_BASED_OUTPATIENT_CLINIC_OR_DEPARTMENT_OTHER): Payer: Self-pay | Admitting: Emergency Medicine

## 2012-01-31 ENCOUNTER — Emergency Department (HOSPITAL_BASED_OUTPATIENT_CLINIC_OR_DEPARTMENT_OTHER): Payer: Medicaid Other

## 2012-01-31 ENCOUNTER — Emergency Department (HOSPITAL_BASED_OUTPATIENT_CLINIC_OR_DEPARTMENT_OTHER)
Admission: EM | Admit: 2012-01-31 | Discharge: 2012-01-31 | Disposition: A | Discharge status: Home / Self Care | Payer: Medicaid Other | Attending: Emergency Medicine | Admitting: Emergency Medicine

## 2012-01-31 DIAGNOSIS — M545 Low back pain, unspecified: Secondary | ICD-10-CM | POA: Insufficient documentation

## 2012-01-31 DIAGNOSIS — M549 Dorsalgia, unspecified: Secondary | ICD-10-CM

## 2012-01-31 DIAGNOSIS — I10 Essential (primary) hypertension: Secondary | ICD-10-CM | POA: Insufficient documentation

## 2012-01-31 DIAGNOSIS — Z886 Allergy status to analgesic agent status: Secondary | ICD-10-CM | POA: Insufficient documentation

## 2012-01-31 DIAGNOSIS — F17200 Nicotine dependence, unspecified, uncomplicated: Secondary | ICD-10-CM | POA: Insufficient documentation

## 2012-01-31 DIAGNOSIS — Z981 Arthrodesis status: Secondary | ICD-10-CM | POA: Insufficient documentation

## 2012-01-31 DIAGNOSIS — N390 Urinary tract infection, site not specified: Secondary | ICD-10-CM | POA: Insufficient documentation

## 2012-01-31 LAB — URINALYSIS, ROUTINE W REFLEX MICROSCOPIC
Ketones, ur: NEGATIVE mg/dL
Nitrite: NEGATIVE
Protein, ur: 30 mg/dL — AB
pH: 7.5 (ref 5.0–8.0)

## 2012-01-31 LAB — PREGNANCY, URINE: Preg Test, Ur: NEGATIVE

## 2012-01-31 MED ORDER — CYCLOBENZAPRINE HCL 10 MG PO TABS: 10.0000 mg | ORAL_TABLET | Freq: Two times a day (BID) | ORAL | Status: DC | PRN

## 2012-01-31 MED ORDER — IBUPROFEN 800 MG PO TABS
800.0000 mg | ORAL_TABLET | Freq: Once | ORAL | Status: AC
  Administered 2012-01-31: 800 mg via ORAL
  Filled 2012-01-31: qty 1

## 2012-01-31 MED ORDER — CEPHALEXIN 500 MG PO CAPS: 500.0000 mg | ORAL_CAPSULE | Freq: Four times a day (QID) | ORAL | Status: DC

## 2012-01-31 MED ORDER — ACETAMINOPHEN 325 MG PO TABS
650.0000 mg | ORAL_TABLET | Freq: Once | ORAL | Status: AC
  Administered 2012-01-31: 650 mg via ORAL
  Filled 2012-01-31: qty 2

## 2012-01-31 MED ORDER — CYCLOBENZAPRINE HCL 10 MG PO TABS
5.0000 mg | ORAL_TABLET | Freq: Once | ORAL | Status: AC
  Administered 2012-01-31: 5 mg via ORAL
  Filled 2012-01-31: qty 1

## 2012-01-31 NOTE — ED Provider Notes (Signed)
History     CSN: [id number]  Arrival date & time 01/31/12  0846   First MD Initiated Contact with Patient 01/31/12 0915      Chief Complaint  Patient presents with  . Back Pain  . Urinary Frequency    (Consider location/radiation/quality/duration/timing/severity/associated sxs/prior treatment) HPI Complaining of left low back pain that began unknown time ago. She has had no trauma is recently to this area. She did have surgery for scoliosis in 2002. She states that she is concerned because previously she had back pain and there was a piece of bone moving moving. She has pain with movement. She has pain with palpation. Pain is severe. She is taking Percocet and ibuprofen without relief. He states that she does have urinary frequency for several days and has a history of urinary tract infection in the past. She does not have a primary care Dr. She denies any numbness, tingling, weakness, loss of bowel or bladder control. Past Medical History  Diagnosis Date  . Hypertension     Past Surgical History  Procedure Date  . Cesarean section   . Back surgery     History reviewed. No pertinent family history.  History  Substance Use Topics  . Smoking status: Current Every Day Smoker -- 0.2 packs/day    Types: Cigarettes  . Smokeless tobacco: Never Used  . Alcohol Use: Yes     occ    OB History    Grav Para Term Preterm Abortions TAB SAB Ect Mult Living                  Review of Systems  Constitutional: Negative for fever, chills, activity change, appetite change and unexpected weight change.  HENT: Negative for sore throat, rhinorrhea, neck pain, neck stiffness and sinus pressure.   Eyes: Negative for visual disturbance.  Respiratory: Negative for cough and shortness of breath.   Cardiovascular: Negative for chest pain and leg swelling.  Gastrointestinal: Negative for vomiting, abdominal pain, diarrhea and blood in stool.  Genitourinary: Negative for dysuria, urgency,  frequency, vaginal discharge and difficulty urinating.  Musculoskeletal: Positive for back pain. Negative for myalgias, arthralgias and gait problem.  Skin: Negative for color change and rash.  Neurological: Negative for weakness, light-headedness and headaches.  Hematological: Does not bruise/bleed easily.  Psychiatric/Behavioral: Negative for dysphoric mood.    Allergies  Hydrocodone and Morphine and related  Home Medications   Current Outpatient Rx  Name Route Sig Dispense Refill  . AMPHETAMINE-DEXTROAMPHETAMINE 10 MG PO TABS Oral Take 10 mg by mouth 3 (three) times daily as needed.    . AMPHETAMINE-DEXTROAMPHETAMINE 30 MG PO TABS Oral Take 30 mg by mouth 2 (two) times daily.    . TAB-A-VITE/IRON PO TABS Oral Take 1 tablet by mouth daily.    Marland Kitchen NORGESTIM-ETH ESTRAD TRIPHASIC 0.18/0.215/0.25 MG-25 MCG PO TABS Oral Take 1 tablet by mouth daily.    . OXYCODONE HCL 5 MG PO TABS Oral Take 5 mg by mouth every 4 (four) hours as needed.      BP 139/91  Pulse 65  Temp 97.5 F (36.4 C) (Oral)  Resp 18  Ht 5' 7"  (1.702 m)  Wt 170 lb (77.111 kg)  BMI 26.63 kg/m2  SpO2 97%  LMP 01/14/2012  Physical Exam  Nursing note and vitals reviewed. Constitutional: She is oriented to person, place, and time. She appears well-developed and well-nourished.  HENT:  Head: Normocephalic and atraumatic.  Eyes: Conjunctivae normal and EOM are normal. Pupils are equal, round,  and reactive to light.  Neck: Normal range of motion. Neck supple.  Cardiovascular: Normal rate, regular rhythm, normal heart sounds and intact distal pulses.   Pulmonary/Chest: Effort normal and breath sounds normal.  Abdominal: Soft. Bowel sounds are normal.  Musculoskeletal: Normal range of motion.       Moderate tenderness to palpation left paraspinal lumbar region.  Neurological: She is alert and oriented to person, place, and time. She has normal strength and normal reflexes. She displays a negative Romberg sign.  Skin:  Skin is warm and dry.  Psychiatric: She has a normal mood and affect. Thought content normal.    ED Course  Procedures (including critical care time)  Labs Reviewed  URINALYSIS, ROUTINE W REFLEX MICROSCOPIC - Abnormal; Notable for the following:    APPearance CLOUDY (*)     Hgb urine dipstick MODERATE (*)     Protein, ur 30 (*)     Leukocytes, UA LARGE (*)     All other components within normal limits  URINE MICROSCOPIC-ADD ON - Abnormal; Notable for the following:    Bacteria, UA MANY (*)     All other components within normal limits  PREGNANCY, URINE   No results found.   No diagnosis found.  No information on file.   MDM  Patient with urinalysis consistent with UTI. Patient will be placed on Keflex. Lumbar x-rays without acute abnormality and patient is prescribed muscle relaxants.       Shaune Pollack, MD 01/31/12 1026

## 2012-01-31 NOTE — ED Notes (Signed)
Pt reports 9/10 lower left back pain. Pt states she has had "rods placed in her spine and is concerned they may have become displaced". Pt reports taking a percocet and ibuprofen this morning without relief. Pt also reports urinary frequency.

## 2012-06-24 ENCOUNTER — Emergency Department (HOSPITAL_BASED_OUTPATIENT_CLINIC_OR_DEPARTMENT_OTHER)
Admission: EM | Admit: 2012-06-24 | Discharge: 2012-06-24 | Disposition: A | Discharge status: Home / Self Care | Payer: Medicaid Other | Attending: Emergency Medicine | Admitting: Emergency Medicine

## 2012-06-24 ENCOUNTER — Encounter (HOSPITAL_BASED_OUTPATIENT_CLINIC_OR_DEPARTMENT_OTHER): Payer: Self-pay

## 2012-06-24 ENCOUNTER — Emergency Department (HOSPITAL_BASED_OUTPATIENT_CLINIC_OR_DEPARTMENT_OTHER)
Admission: EM | Admit: 2012-06-24 | Discharge: 2012-06-24 | Disposition: A | Discharge status: Home / Self Care | Payer: Medicaid Other | Source: Home / Self Care | Attending: Emergency Medicine | Admitting: Emergency Medicine

## 2012-06-24 ENCOUNTER — Encounter (HOSPITAL_BASED_OUTPATIENT_CLINIC_OR_DEPARTMENT_OTHER): Payer: Self-pay | Admitting: *Deleted

## 2012-06-24 DIAGNOSIS — L03317 Cellulitis of buttock: Secondary | ICD-10-CM | POA: Insufficient documentation

## 2012-06-24 DIAGNOSIS — L0390 Cellulitis, unspecified: Secondary | ICD-10-CM

## 2012-06-24 DIAGNOSIS — Z79899 Other long term (current) drug therapy: Secondary | ICD-10-CM | POA: Insufficient documentation

## 2012-06-24 DIAGNOSIS — I10 Essential (primary) hypertension: Secondary | ICD-10-CM | POA: Insufficient documentation

## 2012-06-24 DIAGNOSIS — L0291 Cutaneous abscess, unspecified: Secondary | ICD-10-CM

## 2012-06-24 DIAGNOSIS — F17200 Nicotine dependence, unspecified, uncomplicated: Secondary | ICD-10-CM | POA: Insufficient documentation

## 2012-06-24 DIAGNOSIS — L0231 Cutaneous abscess of buttock: Secondary | ICD-10-CM | POA: Insufficient documentation

## 2012-06-24 DIAGNOSIS — L02419 Cutaneous abscess of limb, unspecified: Secondary | ICD-10-CM | POA: Insufficient documentation

## 2012-06-24 DIAGNOSIS — L03119 Cellulitis of unspecified part of limb: Secondary | ICD-10-CM | POA: Insufficient documentation

## 2012-06-24 MED ORDER — CEPHALEXIN 500 MG PO CAPS: 500.0000 mg | ORAL_CAPSULE | Freq: Four times a day (QID) | ORAL | Status: DC

## 2012-06-24 MED ORDER — OXYCODONE-ACETAMINOPHEN 5-325 MG PO TABS
1.0000 | ORAL_TABLET | Freq: Once | ORAL | Status: AC
  Administered 2012-06-24: 1 via ORAL
  Filled 2012-06-24 (×2): qty 1

## 2012-06-24 MED ORDER — CLINDAMYCIN HCL 300 MG PO CAPS: 300.0000 mg | ORAL_CAPSULE | Freq: Three times a day (TID) | ORAL | Status: DC

## 2012-06-24 MED ORDER — OXYCODONE-ACETAMINOPHEN 5-325 MG PO TABS: 1.0000 | ORAL_TABLET | Freq: Four times a day (QID) | ORAL | Status: DC | PRN

## 2012-06-24 MED ORDER — CLINDAMYCIN HCL 150 MG PO CAPS
300.0000 mg | ORAL_CAPSULE | Freq: Once | ORAL | Status: AC
  Administered 2012-06-24: 300 mg via ORAL
  Filled 2012-06-24: qty 2

## 2012-06-24 NOTE — ED Provider Notes (Signed)
History     CSN: [id number]  Arrival date & time 06/24/12  2237   First MD Initiated Contact with Patient 06/24/12 2303      Chief Complaint  Patient presents with  . Abscess    (Consider location/radiation/quality/duration/timing/severity/associated sxs/prior treatment) Patient is a 27 y.o. female presenting with abscess. The history is provided by the patient.  Abscess Abscess location: RIGHT BUTTOCK. Abscess quality: induration, painful, redness and warmth   Red streaking: no   Duration:  3 days Progression:  Worsening Pain details:    Severity:  Severe   Timing:  Constant   Progression:  Worsening Chronicity:  New Context: not insect bite/sting   Relieved by:  Nothing Worsened by:  Nothing tried Ineffective treatments:  NSAIDs Associated symptoms: no anorexia and no fever   Risk factors: no family hx of MRSA     Past Medical History  Diagnosis Date  . Hypertension     Past Surgical History  Procedure Laterality Date  . Cesarean section    . Back surgery      No family history on file.  History  Substance Use Topics  . Smoking status: Current Every Day Smoker -- 0.25 packs/day    Types: Cigarettes  . Smokeless tobacco: Never Used  . Alcohol Use: Yes     Comment: occ    OB History   Grav Para Term Preterm Abortions TAB SAB Ect Mult Living                  Review of Systems  Constitutional: Negative for fever.  Gastrointestinal: Negative for anorexia.  All other systems reviewed and are negative.    Allergies  Hydrocodone and Morphine and related  Home Medications   Current Outpatient Rx  Name  Route  Sig  Dispense  Refill  . amphetamine-dextroamphetamine (ADDERALL) 10 MG tablet   Oral   Take 10 mg by mouth 3 (three) times daily as needed.         Marland Kitchen amphetamine-dextroamphetamine (ADDERALL) 30 MG tablet   Oral   Take 30 mg by mouth 2 (two) times daily.         . cephALEXin (KEFLEX) 500 MG capsule   Oral   Take 1 capsule (500  mg total) by mouth 4 (four) times daily.   28 capsule   0   . clindamycin (CLEOCIN) 300 MG capsule   Oral   Take 1 capsule (300 mg total) by mouth 3 (three) times daily.   21 capsule   0   . cyclobenzaprine (FLEXERIL) 10 MG tablet   Oral   Take 1 tablet (10 mg total) by mouth 2 (two) times daily as needed for muscle spasms.   20 tablet   0   . Multiple Vitamins-Iron (MULTIVITAMINS WITH IRON) TABS   Oral   Take 1 tablet by mouth daily.         . Norgestimate-Ethinyl Estradiol Triphasic (ORTHO TRI-CYCLEN LO) 0.18/0.215/0.25 MG-25 MCG tablet   Oral   Take 1 tablet by mouth daily.         Marland Kitchen oxyCODONE (OXY IR/ROXICODONE) 5 MG immediate release tablet   Oral   Take 5 mg by mouth every 4 (four) hours as needed.         Marland Kitchen oxyCODONE-acetaminophen (PERCOCET/ROXICET) 5-325 MG per tablet   Oral   Take 1-2 tablets by mouth every 6 (six) hours as needed for pain.   15 tablet   0     BP 131/86  Pulse 88  Temp(Src) 98.2 F (36.8 C) (Oral)  Resp 16  SpO2 100%  LMP 06/14/2012  Physical Exam  Constitutional: She is oriented to person, place, and time. She appears well-developed and well-nourished. No distress.  HENT:  Head: Normocephalic and atraumatic.  Mouth/Throat: Oropharynx is clear and moist.  Eyes: Conjunctivae are normal. Pupils are equal, round, and reactive to light.  Neck: Normal range of motion. Neck supple.  Cardiovascular: Normal rate and regular rhythm.   Pulmonary/Chest: Effort normal and breath sounds normal.  Abdominal: Soft. Bowel sounds are normal. There is no tenderness. There is no rebound and no guarding.  Musculoskeletal: Normal range of motion.  Neurological: She is alert and oriented to person, place, and time.  Skin: Skin is warm and dry.  5 cm area of erythema with warmth induration and central fluctuance  Psychiatric: She has a normal mood and affect.    ED Course  Procedures (including critical care time)  Labs Reviewed - No data to  display No results found.   No diagnosis found.    MDM  INCISION AND DRAINAGE Performed by: Carlisle Beers Consent: Verbal consent obtained. Risks and benefits: risks, benefits and alternatives were discussed Type: abscess  Body area: right buttock  Anesthesia: local infiltration  Incision was made with a scalpel.  Local anesthetic: lidocaine 1% with epinephrine  Anesthetic total: 4 ml  Complexity: complex Blunt dissection to break up loculations  Drainage: purulent  Drainage amount: 4 cc  Irrigated copiously, cyst removed  Patient tolerance: Patient tolerated the procedure well with no immediate complications.     Will add a second antibiotic, return for recheck in 48 hours sooner for fevers, streaking or any concerns.  Patient verbalizes understanding and agrees to follow up tetanus UTD      Lakresha Stifter Alfonso Patten, MD 06/24/12 2347

## 2012-06-24 NOTE — ED Notes (Signed)
Patient states she has a pimple that came up on her right posterior leg at the buttock crease, 2 days ago.  States she attempted to squeeze the site and now is larger, redder and has increased pain.

## 2012-06-24 NOTE — ED Notes (Signed)
Redness marked with pen

## 2012-06-24 NOTE — ED Provider Notes (Signed)
History     CSN: [id number]  Arrival date & time 06/24/12  1114   First MD Initiated Contact with Patient 06/24/12 1137      Chief Complaint  Patient presents with  . Abscess    (Consider location/radiation/quality/duration/timing/severity/associated sxs/prior treatment) HPI  Pt presents with area of redness and pain on right posterior thigh.  She first noticed the area 2 days ago, was small and painful.  Then she tried to pop it  And the area became more red and swollen.  No fever/chills, no vomiting or other systemic symptoms.  Palpation makes it worse.  No treatment prior to arrival.  She has no hx of abscess.  There are no other associated systemic symptoms, there are no other alleviating or modifying factors.  Past Medical History  Diagnosis Date  . Hypertension     Past Surgical History  Procedure Laterality Date  . Cesarean section    . Back surgery      No family history on file.  History  Substance Use Topics  . Smoking status: Current Every Day Smoker -- 0.25 packs/day    Types: Cigarettes  . Smokeless tobacco: Never Used  . Alcohol Use: Yes     Comment: occ    OB History   Grav Para Term Preterm Abortions TAB SAB Ect Mult Living                  Review of Systems ROS reviewed and all otherwise negative except for mentioned in HPI  Allergies  Hydrocodone and Morphine and related  Home Medications   Current Outpatient Rx  Name  Route  Sig  Dispense  Refill  . amphetamine-dextroamphetamine (ADDERALL) 10 MG tablet   Oral   Take 10 mg by mouth 3 (three) times daily as needed.         Marland Kitchen amphetamine-dextroamphetamine (ADDERALL) 30 MG tablet   Oral   Take 30 mg by mouth 2 (two) times daily.         . cephALEXin (KEFLEX) 500 MG capsule   Oral   Take 1 capsule (500 mg total) by mouth 4 (four) times daily.   28 capsule   0   . clindamycin (CLEOCIN) 300 MG capsule   Oral   Take 1 capsule (300 mg total) by mouth 3 (three) times daily.   21  capsule   0   . cyclobenzaprine (FLEXERIL) 10 MG tablet   Oral   Take 1 tablet (10 mg total) by mouth 2 (two) times daily as needed for muscle spasms.   20 tablet   0   . Multiple Vitamins-Iron (MULTIVITAMINS WITH IRON) TABS   Oral   Take 1 tablet by mouth daily.         . Norgestimate-Ethinyl Estradiol Triphasic (ORTHO TRI-CYCLEN LO) 0.18/0.215/0.25 MG-25 MCG tablet   Oral   Take 1 tablet by mouth daily.         Marland Kitchen oxyCODONE (OXY IR/ROXICODONE) 5 MG immediate release tablet   Oral   Take 5 mg by mouth every 4 (four) hours as needed.         Marland Kitchen oxyCODONE-acetaminophen (PERCOCET/ROXICET) 5-325 MG per tablet   Oral   Take 1-2 tablets by mouth every 6 (six) hours as needed for pain.   15 tablet   0     BP 137/98  Temp(Src) 98 F (36.7 C) (Oral)  Ht 5' 7.5" (1.715 m)  Wt 180 lb (81.647 kg)  BMI 27.76 kg/m2  SpO2 99%  LMP 06/14/2012 Vitals reviewed Physical Exam Physical Examination: General appearance - alert, well appearing, and in no distress Mental status - alert, oriented to person, place, and time Eyes - pupils equal and reactive, extraocular eye movements intact Mouth - mucous membranes moist, pharynx normal without lesions Chest - clear to auscultation, no wheezes, rales or rhonchi, symmetric air entry Heart - normal rate, regular rhythm, normal S1, S2, no murmurs, rubs, clicks or gallops Extremities - peripheral pulses normal, no pedal edema, no clubbing or cyanosis Skin - normal coloration and turgor, appro x3-4cm area of erythema and induration on right posterolateral thigh.  No pus collection by bedside ultrasound.   ED Course  Procedures (including critical care time)  12:05 PM EMBU shows no discrete area of pus, some moderate cellulitis   Labs Reviewed - No data to display No results found.   1. Cellulitis       MDM  Pt presenting with area of celllulitis and induration with likely early abscess on right thigh.  No systemic symptoms and  patient is otherwise well appearing.  No collection of prurulence amenable to I and D at this time by ultrasound.  Area marked, pt started on clindamycin for cellutitis and is to return in 48 hours for a recheck.  D/w patient to use warm compresses three times daily- that area may need I and D on return.  Discharged with strict return precautions.  Pt agreeable with plan.        Threasa Beards, MD 06/24/12 1452

## 2012-06-24 NOTE — ED Notes (Signed)
Seen earlier today for abscess to right lateral thigh-reports area is worseneing

## 2012-11-17 DIAGNOSIS — M419 Scoliosis, unspecified: Secondary | ICD-10-CM | POA: Insufficient documentation

## 2013-01-26 ENCOUNTER — Emergency Department (HOSPITAL_BASED_OUTPATIENT_CLINIC_OR_DEPARTMENT_OTHER): Payer: Medicaid Other

## 2013-01-26 ENCOUNTER — Encounter (HOSPITAL_BASED_OUTPATIENT_CLINIC_OR_DEPARTMENT_OTHER): Payer: Self-pay | Admitting: *Deleted

## 2013-01-26 ENCOUNTER — Emergency Department (HOSPITAL_BASED_OUTPATIENT_CLINIC_OR_DEPARTMENT_OTHER)
Admission: EM | Admit: 2013-01-26 | Discharge: 2013-01-26 | Disposition: A | Discharge status: Home / Self Care | Payer: Medicaid Other | Attending: Emergency Medicine | Admitting: Emergency Medicine

## 2013-01-26 DIAGNOSIS — Z792 Long term (current) use of antibiotics: Secondary | ICD-10-CM | POA: Insufficient documentation

## 2013-01-26 DIAGNOSIS — Z79899 Other long term (current) drug therapy: Secondary | ICD-10-CM | POA: Insufficient documentation

## 2013-01-26 DIAGNOSIS — M549 Dorsalgia, unspecified: Secondary | ICD-10-CM | POA: Insufficient documentation

## 2013-01-26 DIAGNOSIS — I10 Essential (primary) hypertension: Secondary | ICD-10-CM | POA: Insufficient documentation

## 2013-01-26 DIAGNOSIS — M25419 Effusion, unspecified shoulder: Secondary | ICD-10-CM | POA: Insufficient documentation

## 2013-01-26 DIAGNOSIS — F17200 Nicotine dependence, unspecified, uncomplicated: Secondary | ICD-10-CM | POA: Insufficient documentation

## 2013-01-26 MED ORDER — DIAZEPAM 5 MG PO TABS
10.0000 mg | ORAL_TABLET | Freq: Once | ORAL | Status: AC
  Administered 2013-01-26: 10 mg via ORAL
  Filled 2013-01-26: qty 2

## 2013-01-26 MED ORDER — OXYCODONE-ACETAMINOPHEN 5-325 MG PO TABS: 2.0000 | ORAL_TABLET | ORAL | Status: DC | PRN

## 2013-01-26 MED ORDER — OXYCODONE-ACETAMINOPHEN 5-325 MG PO TABS
2.0000 | ORAL_TABLET | Freq: Once | ORAL | Status: AC
  Administered 2013-01-26: 2 via ORAL
  Filled 2013-01-26 (×2): qty 2

## 2013-01-26 MED ORDER — CYCLOBENZAPRINE HCL 10 MG PO TABS: 10.0000 mg | ORAL_TABLET | Freq: Two times a day (BID) | ORAL | Status: DC | PRN

## 2013-01-26 NOTE — ED Provider Notes (Signed)
CSN: [id number]     Arrival date & time 01/26/13  0930 History   First MD Initiated Contact with Patient 01/26/13 [phone]     Chief Complaint  Patient presents with  . pain behind scapula with swelling    (Consider location/radiation/quality/duration/timing/severity/associated sxs/prior Treatment) HPI Comments: Pt states that she has been having pain in her right shoulder blade and there seems to be swelling and firmness to the area:pt states that  She has rods in her back:pt states that this has happened previous and there was loose hardware:denies any new injury:denies numbness or weakness:pt states that this has been going on for a year, but the symptoms have worsened over the 3 days:pt states that she took an oxycodone and flexeril last night with little relief  The history is provided by the patient. No language interpreter was used.    Past Medical History  Diagnosis Date  . Hypertension    Past Surgical History  Procedure Laterality Date  . Cesarean section    . Back surgery     History reviewed. No pertinent family history. History  Substance Use Topics  . Smoking status: Current Every Day Smoker -- 0.25 packs/day    Types: Cigarettes  . Smokeless tobacco: Never Used  . Alcohol Use: Yes     Comment: occ   OB History   Grav Para Term Preterm Abortions TAB SAB Ect Mult Living                 Review of Systems  Constitutional: Negative.   Respiratory: Negative.   Cardiovascular: Negative.     Allergies  Hydrocodone and Morphine and related  Home Medications   Current Outpatient Rx  Name  Route  Sig  Dispense  Refill  . amphetamine-dextroamphetamine (ADDERALL) 10 MG tablet   Oral   Take 10 mg by mouth 3 (three) times daily as needed.         Marland Kitchen amphetamine-dextroamphetamine (ADDERALL) 30 MG tablet   Oral   Take 30 mg by mouth 2 (two) times daily.         . cephALEXin (KEFLEX) 500 MG capsule   Oral   Take 1 capsule (500 mg total) by mouth 4 (four) times  daily.   28 capsule   0   . cephALEXin (KEFLEX) 500 MG capsule   Oral   Take 1 capsule (500 mg total) by mouth 4 (four) times daily.   40 capsule   0   . clindamycin (CLEOCIN) 300 MG capsule   Oral   Take 1 capsule (300 mg total) by mouth 3 (three) times daily.   21 capsule   0   . cyclobenzaprine (FLEXERIL) 10 MG tablet   Oral   Take 1 tablet (10 mg total) by mouth 2 (two) times daily as needed for muscle spasms.   20 tablet   0   . Multiple Vitamins-Iron (MULTIVITAMINS WITH IRON) TABS   Oral   Take 1 tablet by mouth daily.         . Norgestimate-Ethinyl Estradiol Triphasic (ORTHO TRI-CYCLEN LO) 0.18/0.215/0.25 MG-25 MCG tablet   Oral   Take 1 tablet by mouth daily.         Marland Kitchen oxyCODONE (OXY IR/ROXICODONE) 5 MG immediate release tablet   Oral   Take 5 mg by mouth every 4 (four) hours as needed.         Marland Kitchen oxyCODONE-acetaminophen (PERCOCET/ROXICET) 5-325 MG per tablet   Oral   Take 1-2 tablets by mouth every  6 (six) hours as needed for pain.   15 tablet   0    BP 127/85  Pulse 66  Temp(Src) 98.3 F (36.8 C) (Oral)  Ht 5' 7"  (1.702 m)  Wt 180 lb (81.647 kg)  BMI 28.19 kg/m2  SpO2 99%  LMP 12/15/2012 Physical Exam  Nursing note and vitals reviewed. Constitutional: She is oriented to person, place, and time. She appears well-developed and well-nourished.  Cardiovascular: Normal rate and regular rhythm.   Pulmonary/Chest: Effort normal and breath sounds normal.  Musculoskeletal:  Right thoracic paraspinal tenderness  Neurological: She is alert and oriented to person, place, and time.  Skin: Skin is warm and dry.    ED Course  Procedures (including critical care time) Labs Review Labs Reviewed - No data to display Imaging Review Dg Thoracic Spine 2 View  01/26/2013   CLINICAL DATA:  Pain  EXAM: THORACIC SPINE - 3 VIEW  COMPARISON:  April 10, 2010  FINDINGS: There is mid thoracic dextroscoliosis and upper lumbar levoscoliosis. The visualized  portions of the rods appear intact on this study. There is no fracture or spondylolisthesis. There is mild disc space narrowing at multiple levels, consistent with mild osteoarthritic change. There is no erosive change or bony destruction.  IMPRESSION: The visualized portions of the rods appear intact and stable. There is persistent scoliosis and mild narrowing of several disk spaces. No fracture or spondylolisthesis. No bony destruction.   Electronically Signed   By: Lowella Grip   On: 01/26/2013 11:20    MDM   1. Back pain    No acute abnormality and hardware is intact:pt is not having any deficits:pt is okay to follow up Dr. Jeanine Luz, NP 01/26/13 1158

## 2013-01-26 NOTE — ED Notes (Signed)
Patient transported to X-ray 

## 2013-01-26 NOTE — ED Notes (Signed)
Pt reports pain behind right shoulder blade off and on for couple weeks but the past 3 days has been getting worse with swelling and pain moving arm states she has 2 rods in back for scolosis and this pain happened before and one of the hooks had come undone and she had to have surgery wants to be sure that isnt what is causing the spasm and pain now

## 2013-01-26 NOTE — ED Provider Notes (Signed)
Medical screening examination/treatment/procedure(s) were performed by non-physician practitioner and as supervising physician I was immediately available for consultation/collaboration.  Orlie Dakin, MD 01/26/13 1323

## 2013-01-27 ENCOUNTER — Ambulatory Visit (INDEPENDENT_AMBULATORY_CARE_PROVIDER_SITE_OTHER): Payer: Medicaid Other | Admitting: Family Medicine

## 2013-01-27 ENCOUNTER — Encounter: Payer: Self-pay | Admitting: Family Medicine

## 2013-01-27 VITALS — BP 119/80 | HR 69 | Ht 68.0 in | Wt 180.0 lb

## 2013-01-27 DIAGNOSIS — M546 Pain in thoracic spine: Secondary | ICD-10-CM

## 2013-01-27 DIAGNOSIS — M549 Dorsalgia, unspecified: Secondary | ICD-10-CM

## 2013-01-27 MED ORDER — PREDNISONE (PAK) 10 MG PO TABS: ORAL_TABLET | ORAL | Status: DC

## 2013-01-27 MED ORDER — TRAMADOL HCL 50 MG PO TABS: 50.0000 mg | ORAL_TABLET | Freq: Three times a day (TID) | ORAL | Status: DC | PRN

## 2013-01-27 NOTE — Patient Instructions (Addendum)
You have at minimum muscle spasms/strain of right trapezius, possibly irritated nerve of thoracic spine going into paraspinal muscles. Prednisone 6 day dose pack to relieve irritation/inflammation of the nerve. Aleve 2 tabs twice a day with food for pain and inflammation - start day AFTER finishing prednisone. Flexeril three times a day as needed for muscle spasms (can make you sleepy - if so do not drive while taking this). Tramadol for severe pain (no driving on this medicine). Simple range of motion exercises within limits of pain to prevent further stiffness. Consider physical therapy for stretching, exercises, traction, and modalities. Heat 15 minutes at a time 3-4 times a day to help with spasms. We will look into further imaging. Watch head position when on computers, texting, when sleeping in bed - should in line with back to prevent further nerve traction and irritation. Follow up will depend on imaging results which we will have to get prior approval for.

## 2013-01-28 ENCOUNTER — Ambulatory Visit: Payer: Medicaid Other | Admitting: Family Medicine

## 2013-01-29 ENCOUNTER — Encounter: Payer: Self-pay | Admitting: Family Medicine

## 2013-01-29 DIAGNOSIS — M546 Pain in thoracic spine: Secondary | ICD-10-CM | POA: Insufficient documentation

## 2013-01-29 DIAGNOSIS — M549 Dorsalgia, unspecified: Secondary | ICD-10-CM | POA: Insufficient documentation

## 2013-01-29 NOTE — Progress Notes (Addendum)
Patient ID: Kelly [name], female   DOB: 1986/05/07, 27 y.o.   MRN: [id number]  PCP: Tamsen Roers, MD  Subjective:   HPI: Patient is a 27 y.o. female here for right shoulder/upper back pain.  Patient reports she's had problems with back dating to around 2000. At the time had scoliosis surgery with rods inserted in her back for an over 50 degree curvature. She then had to have some hardware taken out because it was loose a year later. Did ok for a couple years then has had problems since. Really bad past year and a half. Pain in upper right back inside shoulder blade, radiates laterally. She is right handed. Taking ibuprofen.  Flexeril and percocet from ED. Did PT/HEP in past at [facility] for this - not much benefit. Has not had additional imaging beyond radiographs  Past Medical History  Diagnosis Date  . Hypertension   . ADD (attention deficit disorder)     Current Outpatient Prescriptions on File Prior to Visit  Medication Sig Dispense Refill  . amphetamine-dextroamphetamine (ADDERALL) 10 MG tablet Take 10 mg by mouth 3 (three) times daily as needed.      Marland Kitchen amphetamine-dextroamphetamine (ADDERALL) 30 MG tablet Take 30 mg by mouth 2 (two) times daily.      . cyclobenzaprine (FLEXERIL) 10 MG tablet Take 1 tablet (10 mg total) by mouth 2 (two) times daily as needed for muscle spasms.  20 tablet  0  . Multiple Vitamins-Iron (MULTIVITAMINS WITH IRON) TABS Take 1 tablet by mouth daily.      . Norgestimate-Ethinyl Estradiol Triphasic (ORTHO TRI-CYCLEN LO) 0.18/0.215/0.25 MG-25 MCG tablet Take 1 tablet by mouth daily.      Marland Kitchen oxyCODONE (OXY IR/ROXICODONE) 5 MG immediate release tablet Take 5 mg by mouth every 4 (four) hours as needed.       No current facility-administered medications on file prior to visit.    Past Surgical History  Procedure Laterality Date  . Cesarean section    . Back surgery      scoliosis    Allergies  Allergen Reactions  . Hydrocodone Itching  . Morphine  And Related Nausea And Vomiting    History   Social History  . Marital Status: Single    Spouse Name: N/A    Number of Children: N/A  . Years of Education: N/A   Occupational History  . Not on file.   Social History Main Topics  . Smoking status: Current Every Day Smoker -- 0.25 packs/day    Types: Cigarettes  . Smokeless tobacco: Never Used  . Alcohol Use: Yes     Comment: occ  . Drug Use: No  . Sexual Activity: Yes    Birth Control/ Protection: Pill   Other Topics Concern  . Not on file   Social History Narrative  . No narrative on file    Family History  Problem Relation Age of Onset  . Diabetes Mother   . Diabetes Father   . Hypertension Father   . Heart attack Neg Hx   . Hyperlipidemia Neg Hx   . Sudden death Neg Hx     BP 119/80  Pulse 69  Ht 5' 8"  (1.727 m)  Wt 180 lb (81.647 kg)  BMI 27.38 kg/m2  LMP 12/15/2012  Review of Systems: See HPI above.    Objective:  Physical Exam:  Gen: NAD  Neck/upper back: No gross deformity, swelling, bruising.  Spasms right paraspinal thoracic region, medial to scapula. TTP in this area.  No  midline/bony TTP. FROM neck - pain with extension, right lateral rotation. BUE strength 5/5.   Sensation intact to light touch.   2+ equal reflexes in triceps, biceps, brachioradialis tendons. Negative spurlings. NV intact distal BUEs.  R shoulder: No swelling, ecchymoses.  No gross deformity. No TTP. FROM. Negative Hawkins, Neers. Negative Speeds, Yergasons. Strength 5/5 with empty can and resisted internal/external rotation. Negative apprehension. NV intact distally.    Assessment & Plan:  1. Upper back pain - Chronic, following scoliosis surgery.  She does have radiating pain into right side of back.  Only had radiographs of this area.  Has rods from the surgery which would make visualization difficult with an MRI.  Will start with prednisone then transition to aleve.  Flexeril, tramadol as needed.  CT without  contrast the imaging modality of choice to ensure hardware is stable, try to evaluate discs without a myelogram.  Consider formal PT again.  Ergonomic issues discussed.

## 2013-01-29 NOTE — Assessment & Plan Note (Signed)
Chronic, following scoliosis surgery.  She does have radiating pain into right side of back.  Only had radiographs of this area.  Has rods from the surgery which would make visualization difficult with an MRI.  Will start with prednisone then transition to aleve.  Flexeril, tramadol as needed.  CT without contrast the imaging modality of choice to ensure hardware is stable, try to evaluate discs without a myelogram.  Consider formal PT again.  Ergonomic issues discussed.

## 2013-01-29 NOTE — Addendum Note (Signed)
Addended by: Dene Gentry on: 01/29/2013 01:38 PM   Modules accepted: Orders

## 2013-02-04 ENCOUNTER — Ambulatory Visit (HOSPITAL_BASED_OUTPATIENT_CLINIC_OR_DEPARTMENT_OTHER)
Admission: RE | Admit: 2013-02-04 | Discharge: 2013-02-04 | Disposition: A | Discharge status: Home / Self Care | Payer: Medicaid Other | Source: Ambulatory Visit | Attending: Family Medicine | Admitting: Family Medicine

## 2013-02-04 DIAGNOSIS — N200 Calculus of kidney: Secondary | ICD-10-CM | POA: Insufficient documentation

## 2013-02-04 DIAGNOSIS — M546 Pain in thoracic spine: Secondary | ICD-10-CM

## 2013-02-04 DIAGNOSIS — M5030 Other cervical disc degeneration, unspecified cervical region: Secondary | ICD-10-CM | POA: Insufficient documentation

## 2013-02-04 DIAGNOSIS — M4120 Other idiopathic scoliosis, site unspecified: Secondary | ICD-10-CM | POA: Insufficient documentation

## 2013-06-27 ENCOUNTER — Inpatient Hospital Stay: Payer: Self-pay | Admitting: Surgery

## 2013-06-27 LAB — URINALYSIS, COMPLETE
Blood: NEGATIVE
Glucose,UR: NEGATIVE mg/dL
Ketone: NEGATIVE
Leukocyte Esterase: NEGATIVE
Nitrite: NEGATIVE
Ph: 6
Protein: NEGATIVE
RBC,UR: 1 /HPF
Specific Gravity: 1.018
Squamous Epithelial: 2
WBC UR: 2 /HPF

## 2013-06-27 LAB — COMPREHENSIVE METABOLIC PANEL WITH GFR
Albumin: 3.6 g/dL
Alkaline Phosphatase: 273 U/L — ABNORMAL HIGH
Anion Gap: 4 — ABNORMAL LOW
BUN: 7 mg/dL
Bilirubin,Total: 6.8 mg/dL — ABNORMAL HIGH
Calcium, Total: 9.7 mg/dL
Chloride: 98 mmol/L
Co2: 34 mmol/L — ABNORMAL HIGH
Creatinine: 0.81 mg/dL
EGFR (African American): >60
EGFR (Non-African Amer.): >60
Glucose: 84 mg/dL
Osmolality: 269
Potassium: 3.8 mmol/L
SGOT(AST): 808 U/L — ABNORMAL HIGH
SGPT (ALT): 307 U/L — ABNORMAL HIGH
Sodium: 136 mmol/L
Total Protein: 7.5 g/dL

## 2013-06-27 LAB — CBC WITH DIFFERENTIAL/PLATELET
Basophil #: 0 x10 3/mm 3
Basophil %: 0 %
Eosinophil #: 0.4 x10 3/mm 3
Eosinophil %: 6.6 %
HCT: 44.7 %
HGB: 15.2 g/dL
Lymphocyte %: 17.5 %
Lymphs Abs: 1.2 x10 3/mm 3
MCH: 34 pg
MCHC: 34.1 g/dL
MCV: 100 fL
Monocyte #: 0.5 "x10 3/mm "
Monocyte %: 7.2 %
Neutrophil #: 4.5 x10 3/mm 3
Neutrophil %: 68.7 %
Platelet: 359 x10 3/mm 3
RBC: 4.48 X10 6/mm 3
RDW: 13.1 %
WBC: 6.6 x10 3/mm 3

## 2013-06-27 LAB — PREGNANCY, URINE: PREGNANCY TEST, URINE: NEGATIVE m[IU]/mL

## 2013-06-27 LAB — AMMONIA: Ammonia, Plasma: 33 mcmol/L — ABNORMAL HIGH (ref 11–32)

## 2013-06-27 LAB — LIPASE, BLOOD: Lipase: 115 U/L

## 2013-06-28 LAB — CBC WITH DIFFERENTIAL/PLATELET
Basophil #: 0 10*3/uL (ref 0.0–0.1)
Basophil %: 0.3 %
Eosinophil #: 0.2 10*3/uL (ref 0.0–0.7)
Eosinophil %: 3.9 %
HCT: 41.7 % (ref 35.0–47.0)
HGB: 13.8 g/dL (ref 12.0–16.0)
LYMPHS ABS: 1.7 10*3/uL (ref 1.0–3.6)
Lymphocyte %: 28.9 %
MCH: 33.4 pg (ref 26.0–34.0)
MCHC: 33.2 g/dL (ref 32.0–36.0)
MCV: 101 fL — ABNORMAL HIGH (ref 80–100)
MONOS PCT: 9.6 %
Monocyte #: 0.6 x10 3/mm (ref 0.2–0.9)
Neutrophil #: 3.4 10*3/uL (ref 1.4–6.5)
Neutrophil %: 57.3 %
PLATELETS: 351 10*3/uL (ref 150–440)
RBC: 4.13 10*6/uL (ref 3.80–5.20)
RDW: 13.2 % (ref 11.5–14.5)
WBC: 5.9 10*3/uL (ref 3.6–11.0)

## 2013-06-28 LAB — COMPREHENSIVE METABOLIC PANEL
Albumin: 3.4 g/dL (ref 3.4–5.0)
Alkaline Phosphatase: 215 U/L — ABNORMAL HIGH
Anion Gap: 4 — ABNORMAL LOW (ref 7–16)
BUN: 3 mg/dL — ABNORMAL LOW (ref 7–18)
Bilirubin,Total: 8.2 mg/dL — ABNORMAL HIGH (ref 0.2–1.0)
Calcium, Total: 9 mg/dL (ref 8.5–10.1)
Chloride: 102 mmol/L (ref 98–107)
Co2: 30 mmol/L (ref 21–32)
Creatinine: 0.8 mg/dL (ref 0.60–1.30)
EGFR (African American): >60
EGFR (Non-African Amer.): >60
Glucose: 100 mg/dL — ABNORMAL HIGH (ref 65–99)
Osmolality: 269 (ref 275–301)
Potassium: 4.2 mmol/L (ref 3.5–5.1)
SGOT(AST): 922 U/L — ABNORMAL HIGH (ref 15–37)
SGPT (ALT): 291 U/L — ABNORMAL HIGH (ref 12–78)
Sodium: 136 mmol/L (ref 136–145)
Total Protein: 6.4 g/dL (ref 6.4–8.2)

## 2013-06-28 LAB — PROTIME-INR
INR: 1
Prothrombin Time: 13.1 secs (ref 11.5–14.7)

## 2013-06-28 LAB — URINALYSIS, COMPLETE
Bacteria: NONE SEEN
Blood: NEGATIVE
GLUCOSE, UR: NEGATIVE mg/dL (ref 0–75)
KETONE: NEGATIVE
Leukocyte Esterase: NEGATIVE
NITRITE: NEGATIVE
PH: 5 (ref 4.5–8.0)
Protein: NEGATIVE
RBC,UR: 1 /HPF (ref 0–5)
Specific Gravity: 1.012 (ref 1.003–1.030)

## 2013-06-29 LAB — COMPREHENSIVE METABOLIC PANEL
Albumin: 3.2 g/dL — ABNORMAL LOW (ref 3.4–5.0)
Alkaline Phosphatase: 205 U/L — ABNORMAL HIGH
Anion Gap: 4 — ABNORMAL LOW (ref 7–16)
BILIRUBIN TOTAL: 8.6 mg/dL — AB (ref 0.2–1.0)
BUN: 4 mg/dL — AB (ref 7–18)
CALCIUM: 9.3 mg/dL (ref 8.5–10.1)
CHLORIDE: 101 mmol/L (ref 98–107)
Co2: 29 mmol/L (ref 21–32)
Creatinine: 0.76 mg/dL (ref 0.60–1.30)
EGFR (African American): >60
EGFR (Non-African Amer.): >60
Glucose: 165 mg/dL — ABNORMAL HIGH (ref 65–99)
OSMOLALITY: 269 (ref 275–301)
Potassium: 5.3 mmol/L — ABNORMAL HIGH (ref 3.5–5.1)
SGOT(AST): 556 U/L — ABNORMAL HIGH (ref 15–37)
SGPT (ALT): 261 U/L — ABNORMAL HIGH (ref 12–78)
SODIUM: 134 mmol/L — AB (ref 136–145)
Total Protein: 6.7 g/dL (ref 6.4–8.2)

## 2013-06-29 LAB — LIPASE, BLOOD: Lipase: 80 U/L

## 2013-06-30 LAB — CBC WITH DIFFERENTIAL/PLATELET
BASOS PCT: 0.7 %
Basophil #: 0.1 10*3/uL (ref 0.0–0.1)
Eosinophil #: 0.1 10*3/uL (ref 0.0–0.7)
Eosinophil %: 1.1 %
HCT: 36.9 % (ref 35.0–47.0)
HGB: 12.9 g/dL (ref 12.0–16.0)
Lymphocyte #: 1.8 10*3/uL (ref 1.0–3.6)
Lymphocyte %: 18.8 %
MCH: 35.1 pg — ABNORMAL HIGH (ref 26.0–34.0)
MCHC: 35 g/dL (ref 32.0–36.0)
MCV: 100 fL (ref 80–100)
MONO ABS: 0.8 x10 3/mm (ref 0.2–0.9)
Monocyte %: 8.6 %
NEUTROS ABS: 6.7 10*3/uL — AB (ref 1.4–6.5)
NEUTROS PCT: 70.8 %
PLATELETS: 278 10*3/uL (ref 150–440)
RBC: 3.67 10*6/uL — ABNORMAL LOW (ref 3.80–5.20)
RDW: 13.7 % (ref 11.5–14.5)
WBC: 9.5 10*3/uL (ref 3.6–11.0)

## 2013-06-30 LAB — COMPREHENSIVE METABOLIC PANEL
ANION GAP: 3 — AB (ref 7–16)
BUN: 8 mg/dL (ref 7–18)
CALCIUM: 8.8 mg/dL (ref 8.5–10.1)
CHLORIDE: 104 mmol/L (ref 98–107)
Co2: 31 mmol/L (ref 21–32)
Creatinine: 0.89 mg/dL (ref 0.60–1.30)
EGFR (African American): >60
EGFR (Non-African Amer.): >60
Glucose: 121 mg/dL — ABNORMAL HIGH (ref 65–99)
Osmolality: 275 (ref 275–301)
POTASSIUM: 4.3 mmol/L (ref 3.5–5.1)
Sodium: 138 mmol/L (ref 136–145)

## 2013-06-30 LAB — HEPATIC FUNCTION PANEL A (ARMC)
ALBUMIN: 2.7 g/dL — AB (ref 3.4–5.0)
AST: 360 U/L — AB (ref 15–37)
Alkaline Phosphatase: 169 U/L — ABNORMAL HIGH
Bilirubin, Direct: 3.8 mg/dL — ABNORMAL HIGH (ref 0.00–0.20)
Bilirubin,Total: 4.9 mg/dL — ABNORMAL HIGH (ref 0.2–1.0)
SGPT (ALT): 186 U/L — ABNORMAL HIGH (ref 12–78)
TOTAL PROTEIN: 6 g/dL — AB (ref 6.4–8.2)

## 2013-07-01 LAB — COMPREHENSIVE METABOLIC PANEL
ALBUMIN: 2.9 g/dL — AB (ref 3.4–5.0)
AST: 311 U/L — AB (ref 15–37)
Alkaline Phosphatase: 182 U/L — ABNORMAL HIGH
Anion Gap: 4 — ABNORMAL LOW (ref 7–16)
BUN: 7 mg/dL (ref 7–18)
Bilirubin,Total: 6.1 mg/dL — ABNORMAL HIGH (ref 0.2–1.0)
Calcium, Total: 9.2 mg/dL (ref 8.5–10.1)
Chloride: 100 mmol/L (ref 98–107)
Co2: 31 mmol/L (ref 21–32)
Creatinine: 0.8 mg/dL (ref 0.60–1.30)
EGFR (African American): >60
EGFR (Non-African Amer.): >60
GLUCOSE: 115 mg/dL — AB (ref 65–99)
Osmolality: 269 (ref 275–301)
Potassium: 4.5 mmol/L (ref 3.5–5.1)
SGPT (ALT): 167 U/L — ABNORMAL HIGH (ref 12–78)
Sodium: 135 mmol/L — ABNORMAL LOW (ref 136–145)
Total Protein: 6.2 g/dL — ABNORMAL LOW (ref 6.4–8.2)

## 2013-07-01 LAB — PLATELET COUNT: PLATELETS: 322 10*3/uL (ref 150–440)

## 2013-07-02 LAB — HEPATIC FUNCTION PANEL A (ARMC)
ALK PHOS: 165 U/L — AB
Albumin: 2.6 g/dL — ABNORMAL LOW (ref 3.4–5.0)
BILIRUBIN DIRECT: 2.1 mg/dL — AB (ref 0.00–0.20)
Bilirubin,Total: 3.1 mg/dL — ABNORMAL HIGH (ref 0.2–1.0)
SGOT(AST): 219 U/L — ABNORMAL HIGH (ref 15–37)
SGPT (ALT): 125 U/L — ABNORMAL HIGH (ref 12–78)
Total Protein: 5.9 g/dL — ABNORMAL LOW (ref 6.4–8.2)

## 2013-07-02 LAB — COMPREHENSIVE METABOLIC PANEL
Anion Gap: 4 — ABNORMAL LOW (ref 7–16)
BUN: 7 mg/dL (ref 7–18)
CHLORIDE: 102 mmol/L (ref 98–107)
Calcium, Total: 8.8 mg/dL (ref 8.5–10.1)
Co2: 30 mmol/L (ref 21–32)
Creatinine: 0.82 mg/dL (ref 0.60–1.30)
Glucose: 113 mg/dL — ABNORMAL HIGH (ref 65–99)
Osmolality: 271 (ref 275–301)
Potassium: 3.7 mmol/L (ref 3.5–5.1)
Sodium: 136 mmol/L (ref 136–145)

## 2013-07-02 LAB — PATHOLOGY REPORT

## 2013-07-10 ENCOUNTER — Other Ambulatory Visit: Payer: Self-pay | Admitting: Surgery

## 2013-07-10 LAB — URINALYSIS, COMPLETE
BILIRUBIN, UR: NEGATIVE
Bacteria: NONE SEEN
Blood: NEGATIVE
Glucose,UR: NEGATIVE mg/dL (ref 0–75)
KETONE: NEGATIVE
NITRITE: NEGATIVE
PH: 5 (ref 4.5–8.0)
Protein: NEGATIVE
RBC,UR: 2 /HPF (ref 0–5)
Specific Gravity: 1.02 (ref 1.003–1.030)

## 2013-07-10 LAB — CBC WITH DIFFERENTIAL/PLATELET
Basophil #: 0.1 10*3/uL (ref 0.0–0.1)
Basophil %: 1.2 %
EOS ABS: 0.2 10*3/uL (ref 0.0–0.7)
EOS PCT: 2.5 %
HCT: 40 % (ref 35.0–47.0)
HGB: 13.9 g/dL (ref 12.0–16.0)
LYMPHS PCT: 36.7 %
Lymphocyte #: 3.1 10*3/uL (ref 1.0–3.6)
MCH: 34.3 pg — AB (ref 26.0–34.0)
MCHC: 34.7 g/dL (ref 32.0–36.0)
MCV: 99 fL (ref 80–100)
Monocyte #: 0.9 x10 3/mm (ref 0.2–0.9)
Monocyte %: 10.2 %
NEUTROS ABS: 4.2 10*3/uL (ref 1.4–6.5)
Neutrophil %: 49.4 %
Platelet: 390 10*3/uL (ref 150–440)
RBC: 4.05 10*6/uL (ref 3.80–5.20)
RDW: 13 % (ref 11.5–14.5)
WBC: 8.5 10*3/uL (ref 3.6–11.0)

## 2013-07-10 LAB — HEPATIC FUNCTION PANEL A (ARMC)
ALBUMIN: 3.4 g/dL (ref 3.4–5.0)
AST: 115 U/L — AB (ref 15–37)
Alkaline Phosphatase: 120 U/L — ABNORMAL HIGH
BILIRUBIN TOTAL: 1.9 mg/dL — AB (ref 0.2–1.0)
Bilirubin, Direct: 1.2 mg/dL — ABNORMAL HIGH (ref 0.00–0.20)
SGPT (ALT): 95 U/L — ABNORMAL HIGH (ref 12–78)
Total Protein: 7.7 g/dL (ref 6.4–8.2)

## 2013-07-12 HISTORY — PX: CHOLECYSTECTOMY: SHX55

## 2013-08-18 ENCOUNTER — Emergency Department (HOSPITAL_COMMUNITY)
Admission: EM | Admit: 2013-08-18 | Discharge: 2013-08-18 | Disposition: A | Discharge status: Home / Self Care | Payer: Medicaid Other | Attending: Emergency Medicine | Admitting: Emergency Medicine

## 2013-08-18 ENCOUNTER — Encounter (HOSPITAL_COMMUNITY): Payer: Self-pay | Admitting: Emergency Medicine

## 2013-08-18 DIAGNOSIS — F39 Unspecified mood [affective] disorder: Secondary | ICD-10-CM | POA: Insufficient documentation

## 2013-08-18 DIAGNOSIS — Z79899 Other long term (current) drug therapy: Secondary | ICD-10-CM | POA: Insufficient documentation

## 2013-08-18 DIAGNOSIS — K5090 Crohn's disease, unspecified, without complications: Secondary | ICD-10-CM | POA: Insufficient documentation

## 2013-08-18 DIAGNOSIS — F1010 Alcohol abuse, uncomplicated: Secondary | ICD-10-CM

## 2013-08-18 DIAGNOSIS — M546 Pain in thoracic spine: Secondary | ICD-10-CM | POA: Insufficient documentation

## 2013-08-18 DIAGNOSIS — D126 Benign neoplasm of colon, unspecified: Secondary | ICD-10-CM

## 2013-08-18 DIAGNOSIS — F1910 Other psychoactive substance abuse, uncomplicated: Secondary | ICD-10-CM

## 2013-08-18 DIAGNOSIS — F17200 Nicotine dependence, unspecified, uncomplicated: Secondary | ICD-10-CM | POA: Insufficient documentation

## 2013-08-18 DIAGNOSIS — K589 Irritable bowel syndrome without diarrhea: Secondary | ICD-10-CM | POA: Insufficient documentation

## 2013-08-18 DIAGNOSIS — Z3202 Encounter for pregnancy test, result negative: Secondary | ICD-10-CM | POA: Insufficient documentation

## 2013-08-18 DIAGNOSIS — Z860100 Personal history of colon polyps, unspecified: Secondary | ICD-10-CM | POA: Insufficient documentation

## 2013-08-18 DIAGNOSIS — F988 Other specified behavioral and emotional disorders with onset usually occurring in childhood and adolescence: Secondary | ICD-10-CM | POA: Insufficient documentation

## 2013-08-18 DIAGNOSIS — F1410 Cocaine abuse, uncomplicated: Secondary | ICD-10-CM | POA: Insufficient documentation

## 2013-08-18 DIAGNOSIS — I10 Essential (primary) hypertension: Secondary | ICD-10-CM | POA: Insufficient documentation

## 2013-08-18 DIAGNOSIS — R635 Abnormal weight gain: Secondary | ICD-10-CM | POA: Insufficient documentation

## 2013-08-18 DIAGNOSIS — F1110 Opioid abuse, uncomplicated: Secondary | ICD-10-CM | POA: Insufficient documentation

## 2013-08-18 DIAGNOSIS — F1120 Opioid dependence, uncomplicated: Secondary | ICD-10-CM

## 2013-08-18 LAB — RAPID URINE DRUG SCREEN, HOSP PERFORMED
Amphetamines: NOT DETECTED
BARBITURATES: NOT DETECTED
BENZODIAZEPINES: NOT DETECTED
Cocaine: POSITIVE — AB
Opiates: POSITIVE — AB
Tetrahydrocannabinol: NOT DETECTED

## 2013-08-18 LAB — CBC WITH DIFFERENTIAL/PLATELET
Basophils Absolute: 0.1 10*3/uL (ref 0.0–0.1)
Basophils Relative: 1 % (ref 0–1)
Eosinophils Absolute: 0.1 10*3/uL (ref 0.0–0.7)
Eosinophils Relative: 1 % (ref 0–5)
HCT: 39.9 % (ref 36.0–46.0)
Hemoglobin: 13.5 g/dL (ref 12.0–15.0)
LYMPHS ABS: 2.3 10*3/uL (ref 0.7–4.0)
LYMPHS PCT: 30 % (ref 12–46)
MCH: 33 pg (ref 26.0–34.0)
MCHC: 33.8 g/dL (ref 30.0–36.0)
MCV: 97.6 fL (ref 78.0–100.0)
Monocytes Absolute: 0.8 10*3/uL (ref 0.1–1.0)
Monocytes Relative: 10 % (ref 3–12)
NEUTROS ABS: 4.4 10*3/uL (ref 1.7–7.7)
NEUTROS PCT: 58 % (ref 43–77)
PLATELETS: 311 10*3/uL (ref 150–400)
RBC: 4.09 MIL/uL (ref 3.87–5.11)
RDW: 14.7 % (ref 11.5–15.5)
WBC: 7.6 10*3/uL (ref 4.0–10.5)

## 2013-08-18 LAB — COMPREHENSIVE METABOLIC PANEL
ALK PHOS: 110 U/L (ref 39–117)
AST: 19 U/L (ref 0–37)
Albumin: 3.5 g/dL (ref 3.5–5.2)
BUN: 9 mg/dL (ref 6–23)
CHLORIDE: 100 meq/L (ref 96–112)
CO2: 24 meq/L (ref 19–32)
Calcium: 9.8 mg/dL (ref 8.4–10.5)
Creatinine, Ser: 0.58 mg/dL (ref 0.50–1.10)
GLUCOSE: 105 mg/dL — AB (ref 70–99)
POTASSIUM: 4.6 meq/L (ref 3.7–5.3)
SODIUM: 138 meq/L (ref 137–147)
TOTAL PROTEIN: 7.5 g/dL (ref 6.0–8.3)
Total Bilirubin: 0.4 mg/dL (ref 0.3–1.2)

## 2013-08-18 LAB — ETHANOL

## 2013-08-18 LAB — POC URINE PREG, ED: Preg Test, Ur: NEGATIVE

## 2013-08-18 MED ORDER — NICOTINE 21 MG/24HR TD PT24
21.0000 mg | MEDICATED_PATCH | Freq: Once | TRANSDERMAL | Status: DC
  Administered 2013-08-18: 21 mg via TRANSDERMAL
  Filled 2013-08-18: qty 1

## 2013-08-18 MED ORDER — HYDROXYZINE HCL 25 MG PO TABS
25.0000 mg | ORAL_TABLET | Freq: Four times a day (QID) | ORAL | Status: DC | PRN
  Administered 2013-08-18: 25 mg via ORAL
  Filled 2013-08-18: qty 1

## 2013-08-18 MED ORDER — NAPROXEN 500 MG PO TABS
500.0000 mg | ORAL_TABLET | Freq: Two times a day (BID) | ORAL | Status: DC | PRN
  Administered 2013-08-18: 500 mg via ORAL
  Filled 2013-08-18: qty 1

## 2013-08-18 MED ORDER — METHOCARBAMOL 500 MG PO TABS: 500.0000 mg | ORAL_TABLET | Freq: Three times a day (TID) | ORAL | Status: DC | PRN

## 2013-08-18 MED ORDER — CLONIDINE HCL 0.1 MG PO TABS: 0.1000 mg | ORAL_TABLET | ORAL | Status: DC

## 2013-08-18 MED ORDER — LOPERAMIDE HCL 2 MG PO CAPS: 2.0000 mg | ORAL_CAPSULE | ORAL | Status: DC | PRN

## 2013-08-18 MED ORDER — DICYCLOMINE HCL 20 MG PO TABS: 20.0000 mg | ORAL_TABLET | Freq: Four times a day (QID) | ORAL | Status: DC | PRN

## 2013-08-18 MED ORDER — ONDANSETRON 4 MG PO TBDP
4.0000 mg | ORAL_TABLET | Freq: Four times a day (QID) | ORAL | Status: DC | PRN
  Administered 2013-08-18: 4 mg via ORAL
  Filled 2013-08-18: qty 1

## 2013-08-18 MED ORDER — CLONIDINE HCL 0.1 MG PO TABS: 0.1000 mg | ORAL_TABLET | Freq: Every day | ORAL | Status: DC

## 2013-08-18 MED ORDER — CLONIDINE HCL 0.1 MG PO TABS
0.1000 mg | ORAL_TABLET | Freq: Four times a day (QID) | ORAL | Status: DC
  Administered 2013-08-18: 0.1 mg via ORAL
  Filled 2013-08-18: qty 1

## 2013-08-18 NOTE — Consult Note (Signed)
  Review of Systems  Constitutional: Negative.   HENT: Negative.   Eyes: Negative.   Respiratory: Negative.   Cardiovascular: Negative.   Gastrointestinal: Negative.   Genitourinary: Negative.   Musculoskeletal: Negative.   Skin: Negative.   Neurological: Negative.   Endo/Heme/Allergies: Negative.   Psychiatric/Behavioral: Positive for substance abuse.   History of what sounds like irritable bowel syndrome

## 2013-08-18 NOTE — ED Notes (Signed)
Informed patient and family member of transfer to [facility]. Verbalized understanding.

## 2013-08-18 NOTE — ED Notes (Signed)
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 Last used both at 0100 today. Has not detoxed before. Denies SI/HI.

## 2013-08-18 NOTE — ED Notes (Signed)
Bed: [facility] Expected date:  Expected time:  Means of arrival:  Comments: Med clearance

## 2013-08-18 NOTE — BH Assessment (Signed)
Patient accepted to [facility] and awaiting transport provided by [facility].

## 2013-08-18 NOTE — ED Notes (Signed)
[facility] contacted for transport request to [facility].  Mother in to visit

## 2013-08-18 NOTE — Consult Note (Signed)
[facility] Face-to-Face Psychiatry Consult   Reason for Consult:  Requesting detox from heroin Referring Physician:  ER MD   TIAJUANA LEPPANEN is an 28 y.o. female. Total Time spent with patient: 45 minutes  Assessment: AXIS I:  opioid dependence, cocaine and alcohol abuse AXIS II:  Deferred AXIS III:   Past Medical History  Diagnosis Date  . Hypertension   . ADD (attention deficit disorder)    AXIS IV:  problems to heroin use AXIS V:  51-60 moderate symptoms  Plan:  No evidence of imminent risk to self or others at present.    Subjective:   Lianne KASARAH SITTS is a 28 y.o. female patient admitted with request of detox from heroin.  HPI:  Ms Kruk says she wants to get off heroin which she uses every day.  She also drinks alcohol and uses cocaine regularly.  She admits to depression but denies suicidal ideation but if she does not get help she will only get worse. HPI Elements:   Location:  chronic substance use. Quality:  daily use of cocaine, heroin and alcohol. Severity:  daily use. Timing:  decided she wants to stop. Duration:  years. Context:  history of substance use since her teens.  Past Psychiatric History: Past Medical History  Diagnosis Date  . Hypertension   . ADD (attention deficit disorder)     reports that she has been smoking Cigarettes.  She has been smoking about 0.25 packs per day. She has never used smokeless tobacco. She reports that she drinks alcohol. She reports that she uses illicit drugs (Cocaine). Family History  Problem Relation Age of Onset  . Diabetes Mother   . Diabetes Father   . Hypertension Father   . Heart attack Neg Hx   . Hyperlipidemia Neg Hx   . Sudden death Neg Hx          Abuse/Neglect [name]) Physical Abuse: Denies Verbal Abuse: Denies Sexual Abuse: Denies Allergies:   Allergies  Allergen Reactions  . Hydrocodone Itching  . Morphine And Related Nausea And Vomiting    ACT Assessment Complete:  Yes:    Educational Status     Risk to Self: Risk to self Suicidal Ideation: No Suicidal Intent: No Is patient at risk for suicide?: No Suicidal Plan?: No Access to Means: No What has been your use of drugs/alcohol within the last 12 months?:  (Heroin and Cocaine) Previous Attempts/Gestures: No How many times?:  (0) Other Self Harm Risks:  (none reported ) Triggers for Past Attempts: Other (Comment) (none reported ) Intentional Self Injurious Behavior: None Family Suicide History: No Recent stressful life event(s): Other (Comment) ("I want to get off drugs so I can be a mother to my son") Persecutory voices/beliefs?: No Depression: Yes Depression Symptoms: Feeling angry/irritable;Feeling worthless/self pity;Loss of interest in usual pleasures;Guilt;Isolating;Tearfulness;Insomnia;Despondent;Fatigue Substance abuse history and/or treatment for substance abuse?: Yes Suicide prevention information given to non-admitted patients: Not applicable  Risk to Others: Risk to Others Homicidal Ideation: No Thoughts of Harm to Others: No Current Homicidal Intent: No Current Homicidal Plan: No Access to Homicidal Means: No Identified Victim:  (n/a) History of harm to others?: No Assessment of Violence: None Noted Violent Behavior Description:  (patient is calm and cooperative ) Does patient have access to weapons?: No Criminal Charges Pending?: No Does patient have a court date: No  Abuse: Abuse/Neglect Assessment (Assessment to be complete while patient is alone) Physical Abuse: Denies Verbal Abuse: Denies Sexual Abuse: Denies Exploitation of patient/patient's resources: Denies Self-Neglect:  Denies  Prior Inpatient Therapy: Prior Inpatient Therapy Prior Inpatient Therapy: No Prior Therapy Dates:  (n/a) Prior Therapy Facilty/Provider(s):  (n/a) Reason for Treatment:  (n/a)  Prior Outpatient Therapy: Prior Outpatient Therapy Prior Outpatient Therapy: Yes Prior Therapy Dates:  (past-[facility]) Prior Therapy  Facilty/Provider(s):  [name]) Reason for Treatment:  (medication management)  Additional Information: Additional Information 1:1 In Past 12 Months?: No CIRT Risk: No Elopement Risk: No Does patient have medical clearance?: Yes                  Objective: Blood pressure 131/67, pulse 68, temperature 98.1 F (36.7 C), temperature source Oral, resp. rate 16, last menstrual period 07/15/2013, SpO2 98.00%.There is no weight on file to calculate BMI. Results for orders placed during the hospital encounter of 08/18/13 (from the past 72 hour(s))  CBC WITH DIFFERENTIAL     Status: None   Collection Time    08/18/13 12:55 PM      Result Value Ref Range   WBC 7.6  4.0 - 10.5 K/uL   RBC 4.09  3.87 - 5.11 MIL/uL   Hemoglobin 13.5  12.0 - 15.0 g/dL   HCT 39.9  36.0 - 46.0 %   MCV 97.6  78.0 - 100.0 fL   MCH 33.0  26.0 - 34.0 pg   MCHC 33.8  30.0 - 36.0 g/dL   RDW 14.7  11.5 - 15.5 %   Platelets 311  150 - 400 K/uL   Neutrophils Relative % 58  43 - 77 %   Neutro Abs 4.4  1.7 - 7.7 K/uL   Lymphocytes Relative 30  12 - 46 %   Lymphs Abs 2.3  0.7 - 4.0 K/uL   Monocytes Relative 10  3 - 12 %   Monocytes Absolute 0.8  0.1 - 1.0 K/uL   Eosinophils Relative 1  0 - 5 %   Eosinophils Absolute 0.1  0.0 - 0.7 K/uL   Basophils Relative 1  0 - 1 %   Basophils Absolute 0.1  0.0 - 0.1 K/uL  COMPREHENSIVE METABOLIC PANEL     Status: Abnormal   Collection Time    08/18/13 12:55 PM      Result Value Ref Range   Sodium 138  137 - 147 mEq/L   Potassium 4.6  3.7 - 5.3 mEq/L   Chloride 100  96 - 112 mEq/L   CO2 24  19 - 32 mEq/L   Glucose, Bld 105 (*) 70 - 99 mg/dL   BUN 9  6 - 23 mg/dL   Creatinine, Ser 0.58  0.50 - 1.10 mg/dL   Calcium 9.8  8.4 - 10.5 mg/dL   Total Protein 7.5  6.0 - 8.3 g/dL   Albumin 3.5  3.5 - 5.2 g/dL   AST 19  0 - 37 U/L   Comment: SLIGHT HEMOLYSIS     HEMOLYSIS AT THIS LEVEL MAY AFFECT RESULT   ALT <5  0 - 35 U/L   Alkaline Phosphatase 110  39 - 117 U/L   Total  Bilirubin 0.4  0.3 - 1.2 mg/dL   GFR calc non Af Amer >90  >90 mL/min   GFR calc Af Amer >90  >90 mL/min   Comment: (NOTE)     The eGFR has been calculated using the CKD EPI equation.     This calculation has not been validated in all clinical situations.     eGFR's persistently <90 mL/min signify possible Chronic Kidney     Disease.  ETHANOL     Status: None   Collection Time    08/18/13 12:55 PM      Result Value Ref Range   Alcohol, Ethyl (B) <11  0 - 11 mg/dL   Comment:            LOWEST DETECTABLE LIMIT FOR     SERUM ALCOHOL IS 11 mg/dL     FOR MEDICAL PURPOSES ONLY  URINE RAPID DRUG SCREEN (HOSP PERFORMED)     Status: Abnormal   Collection Time    08/18/13  1:15 PM      Result Value Ref Range   Opiates POSITIVE (*) NONE DETECTED   Cocaine POSITIVE (*) NONE DETECTED   Benzodiazepines NONE DETECTED  NONE DETECTED   Amphetamines NONE DETECTED  NONE DETECTED   Tetrahydrocannabinol NONE DETECTED  NONE DETECTED   Barbiturates NONE DETECTED  NONE DETECTED   Comment:            DRUG SCREEN FOR MEDICAL PURPOSES     ONLY.  IF CONFIRMATION IS NEEDED     FOR ANY PURPOSE, NOTIFY LAB     WITHIN 5 DAYS.                LOWEST DETECTABLE LIMITS     FOR URINE DRUG SCREEN     Drug Class       Cutoff (ng/mL)     Amphetamine      1000     Barbiturate      200     Benzodiazepine   976     Tricyclics       734     Opiates          300     Cocaine          300     THC              50  POC URINE PREG, ED     Status: None   Collection Time    08/18/13  1:23 PM      Result Value Ref Range   Preg Test, Ur NEGATIVE  NEGATIVE   Comment:            THE SENSITIVITY OF THIS     METHODOLOGY IS >24 mIU/mL   Labs are reviewed and are pertinent for opiates and cocaine.  Current Facility-Administered Medications  Medication Dose Route Frequency Provider Last Rate Last Dose  . cloNIDine (CATAPRES) tablet 0.1 mg  0.1 mg Oral QID Carlisle Cater, PA-C   0.1 mg at 08/18/13 1339   Followed by  .  [START ON 08/20/2013] cloNIDine (CATAPRES) tablet 0.1 mg  0.1 mg Oral BH-qamhs Carlisle Cater, PA-C       Followed by  . [START ON 08/23/2013] cloNIDine (CATAPRES) tablet 0.1 mg  0.1 mg Oral QAC breakfast Carlisle Cater, PA-C      . dicyclomine (BENTYL) tablet 20 mg  20 mg Oral Q6H PRN Carlisle Cater, PA-C      . hydrOXYzine (ATARAX/VISTARIL) tablet 25 mg  25 mg Oral Q6H PRN Carlisle Cater, PA-C   25 mg at 08/18/13 1339  . loperamide (IMODIUM) capsule 2-4 mg  2-4 mg Oral PRN Carlisle Cater, PA-C      . methocarbamol (ROBAXIN) tablet 500 mg  500 mg Oral Q8H PRN Carlisle Cater, PA-C      . naproxen (NAPROSYN) tablet 500 mg  500 mg Oral BID PRN Carlisle Cater, PA-C   500 mg at 08/18/13 1339  .  nicotine (NICODERM CQ - dosed in mg/24 hours) patch 21 mg  21 mg Transdermal Once Carlisle Cater, PA-C      . ondansetron (ZOFRAN-ODT) disintegrating tablet 4 mg  4 mg Oral Q6H PRN Carlisle Cater, PA-C   4 mg at 08/18/13 1303   Current Outpatient Prescriptions  Medication Sig Dispense Refill  . amphetamine-dextroamphetamine (ADDERALL) 10 MG tablet Take 10 mg by mouth 3 (three) times daily as needed.      Marland Kitchen amphetamine-dextroamphetamine (ADDERALL) 30 MG tablet Take 30 mg by mouth 2 (two) times daily.      Marland Kitchen oxyCODONE (OXY IR/ROXICODONE) 5 MG immediate release tablet Take 5 mg by mouth 5 (five) times daily.         Psychiatric Specialty Exam:     Blood pressure 131/67, pulse 68, temperature 98.1 F (36.7 C), temperature source Oral, resp. rate 16, last menstrual period 07/15/2013, SpO2 98.00%.There is no weight on file to calculate BMI.  General Appearance: Casual  Eye Contact::  Good  Speech:  Clear and Coherent  Volume:  Normal  Mood:  Depressed  Affect:  Appropriate  Thought Process:  Coherent  Orientation:  Full (Time, Place, and Person)  Thought Content:  Negative  Suicidal Thoughts:  No  Homicidal Thoughts:  No  Memory:  Immediate;   Good Recent;   Good Remote;   Good  Judgement:  Intact  Insight:   Fair  Psychomotor Activity:  Normal  Concentration:  Good  Recall:  Good  Fund of Knowledge:Good  Language: Good  Akathisia:  Negative  Handed:  Right  AIMS (if indicated):     Assets:  Communication Skills Desire for Improvement Housing Social Support Transportation  Sleep:      Musculoskeletal: Strength & Muscle Tone: within normal limits Gait & Station: normal Patient leans: N/A  Treatment Plan Summary: needs a detox facility that deals with opiates  TAYLOR,GERALD D 08/18/2013 3:08 PM

## 2013-08-18 NOTE — ED Notes (Signed)
Patient reports "heavy" heroin use x1 year following discontinuation of prescribed narcotic pain medicines. States "I can't do this anymore,  I'm going to lose my kid".  Multiple s/s of opiate withdraal and prn meds requested and received.  Desires long term SA treatment.  Fluids given.  Support and encouragement given.

## 2013-08-18 NOTE — ED Provider Notes (Signed)
CSN: [id number]     Arrival date & time 08/18/13  1207 History  This chart was scribed for Mickie Hillier working with Kathalene Frames, MD by Roxan Diesel, ED Scribe. This patient was seen in room WTR3/WLPT3 and the patient's care was started at 12:29 PM.   Chief Complaint  Patient presents with  . Medical Clearance    The history is provided by the patient and a relative. No language interpreter was used.    HPI Comments: Kelly [name] is a 28 y.o. female with h/o bipolar disorder, HTN and IBS who presents to the Emergency Department requesting detox from heroin and crack cocaine.  Pt states she has been using heroin for one year and cocaine since age 1.  She states the amount used varies based on how much money she has.  She last used both this morning at 1 AM.   Currently she complains of worsening withdrawal symptoms since around 6 AM this morning described as nausea, vomiting, diarrhea, chills, and generalized body aches.  She also drinks alcohol (at least 4 beers per day) and was on pain medication in the past but does not use this currently.  Pt has not sought detox in the past.  She denies SI/HI.  However she has not been taking medications for her bipolar disorder and states she feels depressed currently.   Mother states that pt "says she doesn't want to hurt herself but she doesn't want to live.  She has a 23-year-old son that she can't take care of.  The only time she gets out of bed is to use."  Mother states pt has had mental health issues for some time but this is the first time she has sought help.  She states that pt recently had a cholecystectomy "but her bilirubin was still high."  She also notes that pt has been gaining weight recently.  Pt is a current smoker.    Patient Active Problem List   Diagnosis Date Noted  . Thoracic back pain 01/29/2013  . CROHN'S DISEASE 05/03/2005  . COLONIC POLYPS 02/28/2003    Past Medical History  Diagnosis Date  . Hypertension   . ADD  (attention deficit disorder)     Past Surgical History  Procedure Laterality Date  . Cesarean section    . Back surgery      scoliosis  . Cholecystectomy  March 2015    Family History  Problem Relation Age of Onset  . Diabetes Mother   . Diabetes Father   . Hypertension Father   . Heart attack Neg Hx   . Hyperlipidemia Neg Hx   . Sudden death Neg Hx     History  Substance Use Topics  . Smoking status: Current Every Day Smoker -- 0.25 packs/day    Types: Cigarettes  . Smokeless tobacco: Never Used  . Alcohol Use: Yes     Comment: every day, 3-4 beers at least    OB History   Grav Para Term Preterm Abortions TAB SAB Ect Mult Living                   Review of Systems  Constitutional: Positive for chills. Negative for fever.  HENT: Negative for rhinorrhea and sore throat.   Eyes: Negative for redness.  Respiratory: Negative for cough.   Cardiovascular: Negative for chest pain.  Gastrointestinal: Positive for nausea, vomiting and diarrhea. Negative for abdominal pain.  Genitourinary: Negative for dysuria.  Musculoskeletal: Positive for myalgias.  Skin: Negative  for rash.  Neurological: Negative for headaches.  Psychiatric/Behavioral: Positive for dysphoric mood. Negative for suicidal ideas.      Allergies  Hydrocodone and Morphine and related  Home Medications   Current Outpatient Rx  Name  Route  Sig  Dispense  Refill  . amphetamine-dextroamphetamine (ADDERALL) 10 MG tablet   Oral   Take 10 mg by mouth 3 (three) times daily as needed.         Marland Kitchen amphetamine-dextroamphetamine (ADDERALL) 30 MG tablet   Oral   Take 30 mg by mouth 2 (two) times daily.         . cyclobenzaprine (FLEXERIL) 10 MG tablet   Oral   Take 1 tablet (10 mg total) by mouth 2 (two) times daily as needed for muscle spasms.   20 tablet   0   . Multiple Vitamins-Iron (MULTIVITAMINS WITH IRON) TABS   Oral   Take 1 tablet by mouth daily.         . Norgestimate-Ethinyl  Estradiol Triphasic (ORTHO TRI-CYCLEN LO) 0.18/0.215/0.25 MG-25 MCG tablet   Oral   Take 1 tablet by mouth daily.         Marland Kitchen oxyCODONE (OXY IR/ROXICODONE) 5 MG immediate release tablet   Oral   Take 5 mg by mouth every 4 (four) hours as needed.         . predniSONE (STERAPRED UNI-PAK) 10 MG tablet      6 tabs po day 1, 5 tabs po day 2, 4 tabs po day 3, 3 tabs po day 4, 2 tabs po day 5, 1 tab po day 6   21 tablet   0   . traMADol (ULTRAM) 50 MG tablet   Oral   Take 1 tablet (50 mg total) by mouth every 8 (eight) hours as needed for pain.   90 tablet   0    BP 139/92  Pulse 68  Temp(Src) 98.1 F (36.7 C) (Oral)  SpO2 98%  LMP 07/15/2013  Physical Exam  Nursing note and vitals reviewed. Constitutional: She appears well-developed and well-nourished. No distress.  HENT:  Head: Normocephalic and atraumatic.  Eyes: Conjunctivae and EOM are normal. Right eye exhibits no discharge. Left eye exhibits no discharge.  Neck: Normal range of motion. Neck supple. No tracheal deviation present.  Cardiovascular: Normal rate, regular rhythm and normal heart sounds.   Pulmonary/Chest: Effort normal and breath sounds normal. No respiratory distress.  Abdominal: Soft. There is no tenderness.  Musculoskeletal: Normal range of motion.  Neurological: She is alert.  Skin: Skin is warm and dry.  Several areas of trackmarks, none appear infected  Psychiatric: Her speech is normal. Her affect is blunt. She is slowed. She expresses no homicidal and no suicidal ideation. She expresses no suicidal plans and no homicidal plans.  Flat affect    ED Course  Procedures (including critical care time)  DIAGNOSTIC STUDIES: Oxygen Saturation is 98% on room air, normal by my interpretation.    COORDINATION OF CARE: 12:36 PM-Discussed treatment plan which includes detox medications and labs with pt at bedside and pt agreed to plan.     Labs Review Labs Reviewed - No data to display  Imaging  Review No results found.   EKG Interpretation None      1:01 PM Patient seen and examined. Requested TTS consult. Holding orders complete.   Vital signs reviewed and are as follows: Filed Vitals:   08/18/13 1220  BP: 139/92  Pulse: 68  Temp: 98.1 F (36.7 C)  3:22 PM Patient is medically cleared. Dispo per psychiatry.    MDM   Final diagnoses:  Polysubstance abuse   Dispo per psych reccs.   I requested TTS consult given heavy substance use compounded with possible severe depression/? H/o bipolar. No current SI/plan voiced.   I personally performed the services described in this documentation, which was scribed in my presence. The recorded information has been reviewed and is accurate.    Carlisle Cater, PA-C 08/18/13 1523

## 2013-08-18 NOTE — ED Notes (Signed)
Detox using heroin, bipolar, SI

## 2013-08-18 NOTE — ED Provider Notes (Signed)
Medical screening examination/treatment/procedure(s) were performed by non-physician practitioner and as supervising physician I was immediately available for consultation/collaboration.   Kathalene Frames, MD 08/18/13 [phone]

## 2013-08-18 NOTE — ED Notes (Signed)
Informed NP the EMTALA  Needed completion.

## 2013-08-18 NOTE — BH Assessment (Signed)
Tele Assessment Note   Kelly [name] is an 28 y.o. female that presents to [facility] for detox. Patient requesting detox off of Cocaine and Heroin. Her drug of choice is however Heroin (IV use). She started using 1 yr ago and since has used 1 gram or more daily. She started using cocaine yrs ago and has used since on/off. Patient unable to specify exact amount of use stating "at least 1 gram". Patients last used both substances 1 am this morning. She reports active withdrawals: nausea, diarrhea, agitation, muscle cramps, and hot/cold flashes. No hx of black outs or seizures. No SI, HI, or AVH's. No hx of SI, HI, and AVH's. No inpatient detox or mental health treatment. No current outpatient mental health provider. Sts that in the past she did seek outpatient therapy at [name].    Axis I: Opioid Dependence, Cocaine Abuse, and Depressive Disorder Nos Axis II: Deferred Axis III:  Past Medical History  Diagnosis Date  . Hypertension   . ADD (attention deficit disorder)    Axis IV: other psychosocial or environmental problems, problems related to social environment, problems with access to health care services and problems with primary support group Axis V: 31-40 impairment in reality testing  Past Medical History:  Past Medical History  Diagnosis Date  . Hypertension   . ADD (attention deficit disorder)     Past Surgical History  Procedure Laterality Date  . Cesarean section    . Back surgery      scoliosis  . Cholecystectomy  March 2015    Family History:  Family History  Problem Relation Age of Onset  . Diabetes Mother   . Diabetes Father   . Hypertension Father   . Heart attack Neg Hx   . Hyperlipidemia Neg Hx   . Sudden death Neg Hx     Social History:  reports that she has been smoking Cigarettes.  She has been smoking about 0.25 packs per day. She has never used smokeless tobacco. She reports that she drinks alcohol. She reports that she uses illicit drugs  (Cocaine).  Additional Social History:  Alcohol / Drug Use Pain Medications: SEE MAR Prescriptions: SEE MAR Over the Counter: SEE MAR History of alcohol / drug use?: Yes Substance #1 Name of Substance 1: Heroin (IV use) 1 - Age of First Use: 28 yrs old  1 - Amount (size/oz): "at least 1 gram or more" 1 - Frequency: daily  1 - Duration: on-going for 1 year  1 - Last Use / Amount: 1 am this morning; 08/18/2013 Substance #2 Name of Substance 2: Cocaine  2 - Age of First Use: 28 yrs old  2 - Amount (size/oz): 1 gram at the least 2 - Frequency: on and off  2 - Duration: on and off for yrs  2 - Last Use / Amount: 08/18/2013  CIWA: CIWA-Ar BP: 131/67 mmHg Pulse Rate: 68 COWS:    Allergies:  Allergies  Allergen Reactions  . Hydrocodone Itching  . Morphine And Related Nausea And Vomiting    Home Medications:  (Not in a hospital admission)  OB/GYN Status:  Patient's last menstrual period was 07/15/2013.  General Assessment Data [facility] of Assessment: [facility] Is this a Tele or Face-to-Face Assessment?: Tele Assessment Is this an Initial Assessment or a Re-assessment for this encounter?: Initial Assessment Living Arrangements: Other (Comment) (lives with ex boyfriend ) Can pt return to current living arrangement?: No Admission Status: Voluntary Is patient capable of signing voluntary admission?: Yes Transfer  from: Acute Hospital Referral Source: Self/Family/Friend     [place] Living Arrangements: Other (Comment) (lives with ex boyfriend ) Name of Psychiatrist:  (No current psychiatrist; past-[facility]) Name of Therapist:  (No current therapist-past [facility])  Education Status Is patient currently in school?: No  Risk to self Suicidal Ideation: No Suicidal Intent: No Is patient at risk for suicide?: No Suicidal Plan?: No Access to Means: No What has been your use of drugs/alcohol within the last 12 months?:  (Heroin and Cocaine) Previous Attempts/Gestures: No How  many times?:  (0) Other Self Harm Risks:  (none reported ) Triggers for Past Attempts: Other (Comment) (none reported ) Intentional Self Injurious Behavior: None Family Suicide History: No Recent stressful life event(s): Other (Comment) ("I want to get off drugs so I can be a mother to my son") Persecutory voices/beliefs?: No Depression: Yes Depression Symptoms: Feeling angry/irritable;Feeling worthless/self pity;Loss of interest in usual pleasures;Guilt;Isolating;Tearfulness;Insomnia;Despondent;Fatigue Substance abuse history and/or treatment for substance abuse?: Yes Suicide prevention information given to non-admitted patients: Not applicable  Risk to Others Homicidal Ideation: No Thoughts of Harm to Others: No Current Homicidal Intent: No Current Homicidal Plan: No Access to Homicidal Means: No Identified Victim:  (n/a) History of harm to others?: No Assessment of Violence: None Noted Violent Behavior Description:  (patient is calm and cooperative ) Does patient have access to weapons?: No Criminal Charges Pending?: No Does patient have a court date: No  Psychosis Hallucinations: None noted Delusions: None noted  Mental Status Report Appear/Hygiene: Disheveled Eye Contact: Good Motor Activity: Freedom of movement Speech: Logical/coherent Level of Consciousness: Alert Mood: Depressed Affect: Appropriate to circumstance Anxiety Level: None Thought Processes: Coherent;Relevant Judgement: Unimpaired Orientation: Person;Place;Time;Situation Obsessive Compulsive Thoughts/Behaviors: Minimal  Cognitive Functioning Concentration: Decreased Memory: Recent Intact;Remote Intact IQ: Average Insight: Good Impulse Control: Fair Appetite: Poor Weight Loss:  (none reported ) Weight Gain:  (none reported ) Sleep: Decreased Total Hours of Sleep:  (varies ) Vegetative Symptoms: None  ADLScreening [name] Assessment Services) Patient's cognitive ability adequate to safely  complete daily activities?: Yes Patient able to express need for assistance with ADLs?: No Independently performs ADLs?: Yes (appropriate for developmental age)  Prior Inpatient Therapy Prior Inpatient Therapy: No Prior Therapy Dates:  (n/a) Prior Therapy Facilty/Provider(s):  (n/a) Reason for Treatment:  (n/a)  Prior Outpatient Therapy Prior Outpatient Therapy: Yes Prior Therapy Dates:  (past-[facility]) Prior Therapy Facilty/Provider(s):  [facility] Reason for Treatment:  (medication management)  ADL Screening (condition at time of admission) Patient's cognitive ability adequate to safely complete daily activities?: Yes Is the patient deaf or have difficulty hearing?: No Does the patient have difficulty seeing, even when wearing glasses/contacts?: No Does the patient have difficulty concentrating, remembering, or making decisions?: Yes Patient able to express need for assistance with ADLs?: No Does the patient have difficulty dressing or bathing?: No Independently performs ADLs?: Yes (appropriate for developmental age) Does the patient have difficulty walking or climbing stairs?: No Weakness of Legs: None Weakness of Arms/Hands: None  Home Assistive Devices/Equipment Home Assistive Devices/Equipment: None    Abuse/Neglect Assessment (Assessment to be complete while patient is alone) Physical Abuse: Denies Verbal Abuse: Denies Sexual Abuse: Denies Exploitation of patient/patient's resources: Denies Self-Neglect: Denies Values / Beliefs Cultural Requests During Hospitalization: None Spiritual Requests During Hospitalization: None   Advance Directives (For Healthcare) Advance Directive: Patient does not have advance directive Nutrition Screen- [facility] Adult/[facility] Patient's home diet: Regular  Additional Information 1:1 In Past 12 Months?: No CIRT Risk: No Elopement Risk: No Does  patient have medical clearance?: Yes     Disposition:  Disposition Initial Assessment Completed for  this Encounter: Yes Disposition of Patient: Inpatient treatment program;Other dispositions;Referred to ([facility]) Type of inpatient treatment program: Adult (Detox at [facility]) Other disposition(s): Other (Comment) ([facility]) Patient referred to: [facility]  Waldon Merl [facility] 08/18/2013 3:17 PM

## 2013-08-24 ENCOUNTER — Inpatient Hospital Stay (HOSPITAL_COMMUNITY): Admission: AD | Admit: 2013-08-24 | Payer: Self-pay | Source: Intra-hospital | Admitting: Psychiatry

## 2013-08-24 ENCOUNTER — Encounter (HOSPITAL_COMMUNITY): Payer: Self-pay | Admitting: *Deleted

## 2013-08-24 ENCOUNTER — Emergency Department (HOSPITAL_COMMUNITY)
Admission: EM | Admit: 2013-08-24 | Discharge: 2013-08-24 | Disposition: A | Discharge status: Home / Self Care | Payer: Medicaid Other | Attending: Emergency Medicine | Admitting: Emergency Medicine

## 2013-08-24 ENCOUNTER — Encounter (HOSPITAL_COMMUNITY): Payer: Self-pay | Admitting: Emergency Medicine

## 2013-08-24 ENCOUNTER — Inpatient Hospital Stay (HOSPITAL_COMMUNITY)
Admission: RE | Admit: 2013-08-24 | Discharge: 2013-09-01 | DRG: 897 | Disposition: A | Discharge status: Rehabilitation Facility | Payer: Medicaid Other | Attending: Psychiatry | Admitting: Psychiatry

## 2013-08-24 DIAGNOSIS — F17200 Nicotine dependence, unspecified, uncomplicated: Secondary | ICD-10-CM | POA: Insufficient documentation

## 2013-08-24 DIAGNOSIS — F1920 Other psychoactive substance dependence, uncomplicated: Principal | ICD-10-CM | POA: Diagnosis present

## 2013-08-24 DIAGNOSIS — F411 Generalized anxiety disorder: Secondary | ICD-10-CM | POA: Diagnosis present

## 2013-08-24 DIAGNOSIS — F319 Bipolar disorder, unspecified: Secondary | ICD-10-CM | POA: Diagnosis present

## 2013-08-24 DIAGNOSIS — Z8249 Family history of ischemic heart disease and other diseases of the circulatory system: Secondary | ICD-10-CM

## 2013-08-24 DIAGNOSIS — F1120 Opioid dependence, uncomplicated: Secondary | ICD-10-CM

## 2013-08-24 DIAGNOSIS — I10 Essential (primary) hypertension: Secondary | ICD-10-CM | POA: Diagnosis present

## 2013-08-24 DIAGNOSIS — F1110 Opioid abuse, uncomplicated: Secondary | ICD-10-CM | POA: Insufficient documentation

## 2013-08-24 DIAGNOSIS — F321 Major depressive disorder, single episode, moderate: Secondary | ICD-10-CM | POA: Diagnosis present

## 2013-08-24 DIAGNOSIS — Z79899 Other long term (current) drug therapy: Secondary | ICD-10-CM | POA: Insufficient documentation

## 2013-08-24 DIAGNOSIS — Z87820 Personal history of traumatic brain injury: Secondary | ICD-10-CM

## 2013-08-24 DIAGNOSIS — F1994 Other psychoactive substance use, unspecified with psychoactive substance-induced mood disorder: Secondary | ICD-10-CM | POA: Diagnosis present

## 2013-08-24 DIAGNOSIS — F329 Major depressive disorder, single episode, unspecified: Secondary | ICD-10-CM | POA: Insufficient documentation

## 2013-08-24 DIAGNOSIS — F1410 Cocaine abuse, uncomplicated: Secondary | ICD-10-CM | POA: Diagnosis present

## 2013-08-24 DIAGNOSIS — F39 Unspecified mood [affective] disorder: Secondary | ICD-10-CM | POA: Diagnosis present

## 2013-08-24 DIAGNOSIS — F3289 Other specified depressive episodes: Secondary | ICD-10-CM | POA: Insufficient documentation

## 2013-08-24 DIAGNOSIS — G4710 Hypersomnia, unspecified: Secondary | ICD-10-CM | POA: Diagnosis present

## 2013-08-24 DIAGNOSIS — Z3202 Encounter for pregnancy test, result negative: Secondary | ICD-10-CM | POA: Insufficient documentation

## 2013-08-24 DIAGNOSIS — F4310 Post-traumatic stress disorder, unspecified: Secondary | ICD-10-CM | POA: Diagnosis present

## 2013-08-24 DIAGNOSIS — R45851 Suicidal ideations: Secondary | ICD-10-CM

## 2013-08-24 DIAGNOSIS — F32A Depression, unspecified: Secondary | ICD-10-CM

## 2013-08-24 DIAGNOSIS — R6889 Other general symptoms and signs: Secondary | ICD-10-CM | POA: Insufficient documentation

## 2013-08-24 DIAGNOSIS — Z833 Family history of diabetes mellitus: Secondary | ICD-10-CM

## 2013-08-24 DIAGNOSIS — F419 Anxiety disorder, unspecified: Secondary | ICD-10-CM

## 2013-08-24 DIAGNOSIS — F1420 Cocaine dependence, uncomplicated: Secondary | ICD-10-CM | POA: Diagnosis present

## 2013-08-24 DIAGNOSIS — F909 Attention-deficit hyperactivity disorder, unspecified type: Secondary | ICD-10-CM | POA: Diagnosis present

## 2013-08-24 DIAGNOSIS — F988 Other specified behavioral and emotional disorders with onset usually occurring in childhood and adolescence: Secondary | ICD-10-CM | POA: Insufficient documentation

## 2013-08-24 DIAGNOSIS — R454 Irritability and anger: Secondary | ICD-10-CM | POA: Insufficient documentation

## 2013-08-24 DIAGNOSIS — F1210 Cannabis abuse, uncomplicated: Secondary | ICD-10-CM | POA: Diagnosis present

## 2013-08-24 DIAGNOSIS — F1310 Sedative, hypnotic or anxiolytic abuse, uncomplicated: Secondary | ICD-10-CM | POA: Insufficient documentation

## 2013-08-24 HISTORY — DX: Other psychoactive substance abuse, uncomplicated: F19.10

## 2013-08-24 HISTORY — DX: Poisoning by unspecified drugs, medicaments and biological substances, intentional self-harm, initial encounter: T50.902A

## 2013-08-24 HISTORY — DX: Bipolar disorder, unspecified: F31.9

## 2013-08-24 LAB — RAPID URINE DRUG SCREEN, HOSP PERFORMED
Amphetamines: NOT DETECTED
BARBITURATES: NOT DETECTED
Benzodiazepines: POSITIVE — AB
Cocaine: POSITIVE — AB
Opiates: POSITIVE — AB
TETRAHYDROCANNABINOL: NOT DETECTED

## 2013-08-24 LAB — COMPREHENSIVE METABOLIC PANEL
ALK PHOS: 99 U/L (ref 39–117)
AST: 17 U/L (ref 0–37)
Albumin: 4 g/dL (ref 3.5–5.2)
BILIRUBIN TOTAL: 0.5 mg/dL (ref 0.3–1.2)
BUN: 12 mg/dL (ref 6–23)
CHLORIDE: 97 meq/L (ref 96–112)
CO2: 21 meq/L (ref 19–32)
Calcium: 10 mg/dL (ref 8.4–10.5)
Creatinine, Ser: 0.66 mg/dL (ref 0.50–1.10)
GFR calc non Af Amer: >90 mL/min (ref >90)
GLUCOSE: 111 mg/dL — AB (ref 70–99)
POTASSIUM: 3.7 meq/L (ref 3.7–5.3)
Sodium: 137 mEq/L (ref 137–147)
TOTAL PROTEIN: 7.9 g/dL (ref 6.0–8.3)

## 2013-08-24 LAB — CBC
HEMATOCRIT: 43.3 % (ref 36.0–46.0)
HEMOGLOBIN: 15.3 g/dL — AB (ref 12.0–15.0)
MCH: 34.5 pg — ABNORMAL HIGH (ref 26.0–34.0)
MCHC: 35.3 g/dL (ref 30.0–36.0)
MCV: 97.5 fL (ref 78.0–100.0)
Platelets: 446 10*3/uL — ABNORMAL HIGH (ref 150–400)
RBC: 4.44 MIL/uL (ref 3.87–5.11)
RDW: 14.6 % (ref 11.5–15.5)
WBC: 17.3 10*3/uL — ABNORMAL HIGH (ref 4.0–10.5)

## 2013-08-24 LAB — ETHANOL: Alcohol, Ethyl (B): <11 mg/dL (ref 0–11)

## 2013-08-24 LAB — ACETAMINOPHEN LEVEL

## 2013-08-24 LAB — SALICYLATE LEVEL: Salicylate Lvl: <2 mg/dL — ABNORMAL LOW (ref 2.8–20.0)

## 2013-08-24 LAB — POC URINE PREG, ED: Preg Test, Ur: NEGATIVE

## 2013-08-24 MED ORDER — ONDANSETRON HCL 4 MG PO TABS: 4.0000 mg | ORAL_TABLET | Freq: Three times a day (TID) | ORAL | Status: DC | PRN

## 2013-08-24 MED ORDER — LORAZEPAM 1 MG PO TABS
2.0000 mg | ORAL_TABLET | Freq: Once | ORAL | Status: AC
  Administered 2013-08-24: 2 mg via ORAL
  Filled 2013-08-24: qty 2

## 2013-08-24 MED ORDER — NICOTINE 21 MG/24HR TD PT24
21.0000 mg | MEDICATED_PATCH | Freq: Every day | TRANSDERMAL | Status: DC
  Administered 2013-08-24: 21 mg via TRANSDERMAL
  Filled 2013-08-24: qty 1

## 2013-08-24 MED ORDER — ACETAMINOPHEN 325 MG PO TABS: 650.0000 mg | ORAL_TABLET | ORAL | Status: DC | PRN

## 2013-08-24 MED ORDER — IBUPROFEN 200 MG PO TABS
600.0000 mg | ORAL_TABLET | Freq: Three times a day (TID) | ORAL | Status: DC | PRN
  Administered 2013-08-24: 600 mg via ORAL
  Filled 2013-08-24: qty 3

## 2013-08-24 NOTE — ED Notes (Addendum)
Report called and given to Romie Minus at [facility]. All questions answered to her satisfaction. Patient also made aware. Vwilliams,rn.

## 2013-08-24 NOTE — Progress Notes (Addendum)
Patient is a walk in at [facility].  Patient reports depression and anxiety after being discharged from [facility] last Friday.  Per, NP - Conrad patient meets criteria for inpatient hospitalization due to SI.  Patient reports that she is not able to contract for safety.   [facility] reports that they will not be able to transport the patient for one hour due to a high volume of patients being transported.   Writer informed the [facility] Counselor Engineer, technical sales) and the Charge Nurse Kyra Searles Lelan Pons) that the patient will be coming to [facility] ER.

## 2013-08-24 NOTE — Progress Notes (Signed)
Patient has been accepted to [facility] per [facility].  The [facility] will need to complete the support paperwork.  The nurse will arrange transportation through [place].  Dr. Sabra Heck is the accepting doctor.

## 2013-08-24 NOTE — ED Notes (Signed)
Pt seen and treated for detox last Tuesday. Sent to [facility]. Released Friday. Not on Bipolar or ADD meds since for a while. Pt feeling depressed. Denies SI/HI at present.

## 2013-08-24 NOTE — ED Notes (Signed)
[facility] transportation called to make arrangement for patient transfer to [facility].
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
## 2013-08-24 NOTE — Progress Notes (Signed)
Pt assisted to call her mother [phone] Pt requesting mother to visit and inquired about food

## 2013-08-24 NOTE — ED Notes (Signed)
Pt discharged to [facility] Left unit just after 2200 in good condition ambulating to checkout accompanied by [facility] transport driver, who transported pt to [facility]. Madelin Headings

## 2013-08-24 NOTE — ED Notes (Signed)
Patient's father and another female visiting with patient. Patient escorted outside with [employer] office to see sick child waiting outside in car

## 2013-08-24 NOTE — ED Notes (Signed)
Bed: WA27 Expected date:  Expected time:  Means of arrival:  Comments: [place]

## 2013-08-24 NOTE — ED Provider Notes (Addendum)
CSN: [id number]     Arrival date & time 08/24/13  1528 History   First MD Initiated Contact with Patient 08/24/13 1750     Chief Complaint  Patient presents with  . Medical Clearance     (Consider location/radiation/quality/duration/timing/severity/associated sxs/prior Treatment) HPI Comments: Pt states off meds for 1 year due to multiple changes in meds for 6 months by 4 different psychiatrist that did not help.  Patient is a 28 y.o. female presenting with mental health disorder. The history is provided by the patient.  Mental Health Problem Presenting symptoms: depression   Presenting symptoms comment:  Anxiety  Patient accompanied by:  Family member Degree of incapacity (severity):  Severe Onset quality:  Gradual Timing:  Constant Progression:  Worsening Chronicity:  Chronic Context: drug abuse and stressful life event   Context: not recent medication change   Treatment compliance:  Untreated Time since last psychoactive medication taken:  1 year Relieved by:  Nothing Worsened by:  Drugs and lack of sleep Ineffective treatments:  Anti-anxiety medications and antidepressants Associated symptoms: anhedonia, anxiety, appetite change, decreased need for sleep and irritability   Associated symptoms: no abdominal pain   Risk factors: hx of mental illness and hx of suicide attempts     Past Medical History  Diagnosis Date  . Hypertension   . ADD (attention deficit disorder)   . Bipolar 1 disorder   . Polysubstance abuse   . Suicidal overdose    Past Surgical History  Procedure Laterality Date  . Cesarean section    . Back surgery      scoliosis  . Cholecystectomy  March 2015   Family History  Problem Relation Age of Onset  . Diabetes Mother   . Diabetes Father   . Hypertension Father   . Heart attack Neg Hx   . Hyperlipidemia Neg Hx   . Sudden death Neg Hx    History  Substance Use Topics  . Smoking status: Current Every Day Smoker -- 0.25 packs/day    Types:  Cigarettes  . Smokeless tobacco: Never Used  . Alcohol Use: Yes     Comment: every day, 3-4 beers at least   OB History   Grav Para Term Preterm Abortions TAB SAB Ect Mult Living                 Review of Systems  Constitutional: Positive for appetite change and irritability.  Gastrointestinal: Negative for abdominal pain.  Psychiatric/Behavioral: The patient is nervous/anxious.   All other systems reviewed and are negative.     Allergies  Hydrocodone and Morphine and related  Home Medications   Current Outpatient Rx  Name  Route  Sig  Dispense  Refill  . amphetamine-dextroamphetamine (ADDERALL) 10 MG tablet   Oral   Take 10 mg by mouth 3 (three) times daily as needed.          Marland Kitchen amphetamine-dextroamphetamine (ADDERALL) 30 MG tablet   Oral   Take 30 mg by mouth daily with lunch.          . amphetamine-dextroamphetamine (ADDERALL) 30 MG tablet   Oral   Take 60 mg by mouth daily with breakfast.         . HYDROcodone-acetaminophen (NORCO/VICODIN) 5-325 MG per tablet   Oral   Take 2 tablets by mouth every 4 (four) hours as needed for moderate pain.         Marland Kitchen ibuprofen (ADVIL,MOTRIN) 800 MG tablet   Oral   Take 800 mg by mouth  every 6 (six) hours as needed for moderate pain (every 6 to 8 hours as needed for pain).          BP 152/97  Pulse 88  Temp(Src) 98.1 F (36.7 C) (Oral)  Resp 18  Ht 5' 7"  (1.702 m)  Wt 190 lb (86.183 kg)  BMI 29.75 kg/m2  SpO2 99%  LMP 07/15/2013 Physical Exam  Nursing note and vitals reviewed. Constitutional: She is oriented to person, place, and time. She appears well-developed and well-nourished. She appears distressed.  Tearful on exam and slightly tremulous  HENT:  Head: Normocephalic and atraumatic.  Mouth/Throat: Oropharynx is clear and moist.  Eyes: Conjunctivae and EOM are normal. Pupils are equal, round, and reactive to light.  Neck: Normal range of motion. Neck supple.  Cardiovascular: Normal rate, regular  rhythm and intact distal pulses.   No murmur heard. Pulmonary/Chest: Effort normal and breath sounds normal. No respiratory distress. She has no wheezes. She has no rales.  Abdominal: Soft. She exhibits no distension. There is no tenderness. There is no rebound and no guarding.  Musculoskeletal: Normal range of motion. She exhibits no edema and no tenderness.  Neurological: She is alert and oriented to person, place, and time.  Skin: Skin is warm and dry. No rash noted. No erythema.  Psychiatric: Her behavior is normal. Her mood appears anxious. She expresses no homicidal and no suicidal ideation.    ED Course  Procedures (including critical care time) Labs Review Labs Reviewed  CBC - Abnormal; Notable for the following:    WBC 17.3 (*)    Hemoglobin 15.3 (*)    MCH 34.5 (*)    Platelets 446 (*)    All other components within normal limits  COMPREHENSIVE METABOLIC PANEL - Abnormal; Notable for the following:    Glucose, Bld 111 (*)    All other components within normal limits  SALICYLATE LEVEL - Abnormal; Notable for the following:    Salicylate Lvl <3.8 (*)    All other components within normal limits  ACETAMINOPHEN LEVEL  ETHANOL  URINE RAPID DRUG SCREEN (HOSP PERFORMED)  HIV ANTIBODY (ROUTINE TESTING)  HEPATITIS PANEL, ACUTE  POC URINE PREG, ED   Imaging Review No results found.   EKG Interpretation None      MDM   Final diagnoses:  Depression  Anxiety    Patient is here with a history of depression and bipolar disease polysubstance abuse he recently went through a detox program however she has not been on any of her bipolar medication for over a year and states she is very depressed and anxious. She denies suicidal ideation but states she is indifferent and does not care if she lives or dies. She has a bed at behavioral health but came here for medical clearance. She is also requesting testing for HIV and hepatitis given she was an IV drug user. On exam she is  anxious, tearful and tremulous. She needs psychiatric evaluation. Medical clearance labs are unremarkable except for leukocytosis of unknown origin this patient denies any infectious symptoms. Patient was given when necessary Ativan and HIV and hepatitis panel sent. Otherwise she is medically clear him to go to behavioral health at 10 PM.    Blanchie Dessert, MD 08/24/13 [place], MD 08/24/13 1829

## 2013-08-25 DIAGNOSIS — F909 Attention-deficit hyperactivity disorder, unspecified type: Secondary | ICD-10-CM | POA: Diagnosis present

## 2013-08-25 DIAGNOSIS — F1994 Other psychoactive substance use, unspecified with psychoactive substance-induced mood disorder: Secondary | ICD-10-CM | POA: Diagnosis present

## 2013-08-25 DIAGNOSIS — F1410 Cocaine abuse, uncomplicated: Secondary | ICD-10-CM | POA: Diagnosis present

## 2013-08-25 DIAGNOSIS — R45851 Suicidal ideations: Secondary | ICD-10-CM

## 2013-08-25 DIAGNOSIS — F39 Unspecified mood [affective] disorder: Secondary | ICD-10-CM | POA: Diagnosis present

## 2013-08-25 DIAGNOSIS — F1120 Opioid dependence, uncomplicated: Secondary | ICD-10-CM | POA: Diagnosis present

## 2013-08-25 LAB — HEPATITIS PANEL, ACUTE
HCV AB: REACTIVE — AB
Hep A IgM: NONREACTIVE
Hep B C IgM: NONREACTIVE
Hepatitis B Surface Ag: NEGATIVE

## 2013-08-25 LAB — HIV ANTIBODY (ROUTINE TESTING W REFLEX): HIV 1&2 Ab, 4th Generation: NONREACTIVE

## 2013-08-25 MED ORDER — BUPROPION HCL ER (XL) 150 MG PO TB24
150.0000 mg | ORAL_TABLET | Freq: Every day | ORAL | Status: DC
  Administered 2013-08-25 – 2013-08-30 (×6): 150 mg via ORAL
  Filled 2013-08-25 (×7): qty 1
  Filled 2013-08-25: qty 14
  Filled 2013-08-25 (×2): qty 1

## 2013-08-25 MED ORDER — TRAZODONE HCL 100 MG PO TABS: 100.0000 mg | ORAL_TABLET | Freq: Every evening | ORAL | Status: DC | PRN

## 2013-08-25 MED ORDER — TRAZODONE HCL 100 MG PO TABS
100.0000 mg | ORAL_TABLET | Freq: Every evening | ORAL | Status: DC | PRN
  Administered 2013-08-25 – 2013-08-28 (×7): 100 mg via ORAL
  Filled 2013-08-25 (×12): qty 1

## 2013-08-25 MED ORDER — MELOXICAM 7.5 MG PO TABS
7.5000 mg | ORAL_TABLET | Freq: Two times a day (BID) | ORAL | Status: DC
  Administered 2013-08-25 – 2013-08-30 (×10): 7.5 mg via ORAL
  Filled 2013-08-25 (×4): qty 1
  Filled 2013-08-25: qty 28
  Filled 2013-08-25 (×11): qty 1
  Filled 2013-08-25: qty 28
  Filled 2013-08-25: qty 1

## 2013-08-25 MED ORDER — NICOTINE 21 MG/24HR TD PT24
21.0000 mg | MEDICATED_PATCH | Freq: Every day | TRANSDERMAL | Status: DC
  Administered 2013-08-25 – 2013-08-28 (×4): 21 mg via TRANSDERMAL
  Filled 2013-08-25 (×6): qty 1

## 2013-08-25 MED ORDER — HYDROXYZINE HCL 25 MG PO TABS
25.0000 mg | ORAL_TABLET | Freq: Four times a day (QID) | ORAL | Status: DC | PRN
  Administered 2013-08-25 – 2013-08-31 (×8): 25 mg via ORAL
  Filled 2013-08-25 (×8): qty 1

## 2013-08-25 MED ORDER — MAGNESIUM HYDROXIDE 400 MG/5ML PO SUSP: 30.0000 mL | Freq: Every day | ORAL | Status: DC | PRN

## 2013-08-25 MED ORDER — ALUM & MAG HYDROXIDE-SIMETH 200-200-20 MG/5ML PO SUSP: 30.0000 mL | ORAL | Status: DC | PRN

## 2013-08-25 MED ORDER — ACETAMINOPHEN 325 MG PO TABS
650.0000 mg | ORAL_TABLET | Freq: Four times a day (QID) | ORAL | Status: DC | PRN
  Administered 2013-08-25 – 2013-08-26 (×3): 650 mg via ORAL
  Filled 2013-08-25 (×3): qty 2

## 2013-08-25 MED ORDER — TRAZODONE HCL 50 MG PO TABS: 50.0000 mg | ORAL_TABLET | Freq: Every evening | ORAL | Status: DC | PRN

## 2013-08-25 MED ORDER — GABAPENTIN 100 MG PO CAPS
100.0000 mg | ORAL_CAPSULE | Freq: Three times a day (TID) | ORAL | Status: DC
  Administered 2013-08-25 – 2013-08-27 (×7): 100 mg via ORAL
  Filled 2013-08-25 (×13): qty 1
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
## 2013-08-25 NOTE — Tx Team (Addendum)
Initial Interdisciplinary Treatment Plan  PATIENT STRENGTHS: (choose at least two) Ability for insight Average or above average intelligence Capable of independent living Communication skills General fund of knowledge Motivation for treatment/growth Physical Health Religious Affiliation Supportive family/friends Work skills  PATIENT STRESSORS: Financial difficulties Legal issue Loss of best friend died on 08/29/2013 Marital or family conflict Medication change or noncompliance Substance abuse   PROBLEM LIST: Problem List/Patient Goals Date to be addressed Date deferred Reason deferred Estimated date of resolution  "I want to find medications for my depression, bipolar and anxiety that work for me because I want to get better" 08/25/13           "Ways to help me stay clean" 08/25/13           Depression 08/25/13     Increased risk for suicide 08/25/13     Substance abuse 08/25/13                  DISCHARGE CRITERIA:  Ability to meet basic life and health needs Adequate post-discharge living arrangements Improved stabilization in mood, thinking, and/or behavior Medical problems require only outpatient monitoring Motivation to continue treatment in a less acute level of care Need for constant or close observation no longer present Reduction of life-threatening or endangering symptoms to within safe limits Safe-care adequate arrangements made Verbal commitment to aftercare and medication compliance Withdrawal symptoms are absent or subacute and managed without 24-hour nursing intervention  PRELIMINARY DISCHARGE PLAN: Attend 12-step recovery group Participate in family therapy Placement in alternative living arrangements  PATIENT/FAMIILY INVOLVEMENT: This treatment plan has been presented to and reviewed with the patient, Kelly [name], and/or family member.  The patient and family have been given the opportunity to ask questions and make suggestions.  Doran Heater 08/25/2013, 12:16 AM

## 2013-08-25 NOTE — BHH Suicide Risk Assessment (Signed)
[facility] INPATIENT:  Family/Significant Other Suicide Prevention Education  Suicide Prevention Education:  Education Completed; Kelly [name] - mother [phone]),  (name of family member/significant other) has been identified by the patient as the family member/significant other with whom the patient will be residing, and identified as the person(s) who will aid the patient in the event of a mental health crisis (suicidal ideations/suicide attempt).  With written consent from the patient, the family member/significant other has been provided the following suicide prevention education, prior to the and/or following the discharge of the patient.  The suicide prevention education provided includes the following:  Suicide risk factors  Suicide prevention and interventions  National Suicide Hotline telephone number  [facility] assessment telephone number  [facility] Emergency Assistance [employer] and/or Residential Mobile Crisis Unit telephone number  Request made of family/significant other to:  Remove weapons (e.g., guns, rifles, knives), all items previously/currently identified as safety concern.    Remove drugs/medications (over-the-counter, prescriptions, illicit drugs), all items previously/currently identified as a safety concern.  The family member/significant other verbalizes understanding of the suicide prevention education information provided.  The family member/significant other agrees to remove the items of safety concern listed above.  Kelly [name] 08/25/2013, 3:09 PM

## 2013-08-25 NOTE — Progress Notes (Signed)
Patient ID: Kelly [name], female   DOB: 1986/01/08, 28 y.o.   MRN: [id number]  The pt was pleasant and cooperative, but tearful during the adm process. Pt informed the writer that she was discharged from [facility] on the 10th of April. Stated, "I have already detoxed, but need to be started back on my medicines". Pt stated she hadn't taken any meds for several months. Pt has a history of IV heroin use and cocaine. Pt stated she wants to stay sober, and realizes that when she doesn't take her meds, she uses drugs. Pt was tearful discussing another reason for adm. Pt stated that the day she was discharged from detox, her best friend Od'd. "That was a wakeup call".

## 2013-08-25 NOTE — Progress Notes (Signed)
Recreation Therapy Notes  Animal-Assisted Activity/Therapy (AAA/T) Program Checklist/Progress Notes Patient Eligibility Criteria Checklist & Daily Group note for Rec Tx Intervention  Date: 04.14.2015 Time: 2:45am Location: [address]    AAA/T Program Assumption of Risk Form signed by Patient/ or Parent Legal Guardian yes  Patient is free of allergies or sever asthma yes  Patient reports no fear of animals yes  Patient reports no history of cruelty to animals yes   Patient understands his/her participation is voluntary yes  Behavioral Response: Did not attend.   Laureen Ochs Teana Lindahl, LRT/CTRS  Lane Hacker 08/25/2013 4:56 PM

## 2013-08-25 NOTE — Progress Notes (Signed)
D:Pt reports that she has decreased energy, tired and in bed much of the day. Pt has requested medication for anxiety and denies any other type of detox symptoms.  A:Offered support, encouragement and 15 minute checks.  R:Pt denies si and hi. Safety maintained on the unit.
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
## 2013-08-25 NOTE — BHH Group Notes (Signed)
Adult Psychoeducational Group Note  Date:  08/25/2013 Time:  10:38 PM  Group Topic/Focus:  AA Meeting  Participation Level:  None  Participation Quality:  Attentive  Affect:  Appropriate  Cognitive:  Alert  Insight: None  Engagement in Group:  None  Modes of Intervention:  Discussion and Education  Additional Comments:  Ayannah attended [place] group.  Lovett Coffin A Ria Comment 08/25/2013, 10:38 PM

## 2013-08-25 NOTE — BHH Group Notes (Signed)
[place] LCSW Group Therapy  08/25/2013 1:15 PM   Type of Therapy:  Group Therapy  Participation Level:  Did Not Attend - pt sleeping in her room  Regan Lemming, [place] 08/25/2013 1:57 PM
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
## 2013-08-25 NOTE — [facility] Group Notes (Signed)
[place] [facility] Group Notes:  (Nursing/MHT/Case Management/Adjunct)  Date:  08/25/2013  Time:  0900 am  Type of Therapy:  Psychoeducational Skills  Participation Level:  Minimal  Participation Quality:  Resistant  Affect:  Depressed  Cognitive:  Alert  Insight:  Improving  Engagement in Group:  None  Modes of Intervention:  Support  Summary of Progress/Problems: Patient stated that she just got here and hopes to "beat my addiction."    Arman Bogus Kurt Hoffmeier 08/25/2013, 11:18 AM

## 2013-08-26 MED ORDER — ATOMOXETINE HCL 25 MG PO CAPS
25.0000 mg | ORAL_CAPSULE | Freq: Every day | ORAL | Status: DC
  Administered 2013-08-26 – 2013-08-30 (×5): 25 mg via ORAL
  Filled 2013-08-26 (×4): qty 1
  Filled 2013-08-26: qty 14
  Filled 2013-08-26 (×2): qty 1

## 2013-08-26 MED ORDER — LAMOTRIGINE 25 MG PO TABS
25.0000 mg | ORAL_TABLET | Freq: Every day | ORAL | Status: DC
  Administered 2013-08-26 – 2013-08-29 (×3): 25 mg via ORAL
  Filled 2013-08-26: qty 14
  Filled 2013-08-26 (×5): qty 1

## 2013-08-26 NOTE — [facility] Group Notes (Signed)
[facility] LCSW Aftercare Discharge Planning Group Note   08/26/2013 8:45 AM  Participation Quality:  Alert, Appropriate and Oriented  Mood/Affect:  Flat and Depressed  Depression Rating:  5  Anxiety Rating:  5  Thoughts of Suicide:  Pt denies SI/HI  Will you contract for safety?   Yes  Current AVH:  Pt denies  Plan for Discharge/Comments:  Pt attended discharge planning group and actively participated in group.  CSW provided pt with today's workbook.  Pt reports feeling okay today.  CSW is working on getting pt into [facility] for further inpatient treatment.  Pt can also follow up at [facility] for outpatient treatment after completing inpatient treatment.  Pt can return home in [place] upon completing treatment.  No further needs voiced by pt at this time.    Transportation Means: Pt reports access to transportation - mother or boyfriend can transport pt to [place]: No supports mentioned at this time  Regan Lemming, LCSW 08/26/2013 9:40 AM
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
## 2013-08-26 NOTE — Progress Notes (Signed)
[facility] MD Progress Note  08/26/2013 4:01 PM Kelly [name]  MRN:  [id number] Subjective: Trying to get her life back together. States that she is concerned about her ADHD as once she goes trough rehab she plan to go back to school. Willing to try the [facility]. States that she wants to have her mood stable and her inattentiveness controlled so she is successful and these symptoms, do not cause a relapse.  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   HENT: Negative.   Eyes: Negative.   Respiratory: Negative.   Cardiovascular: Negative.   Gastrointestinal: Negative.   Genitourinary: Negative.   Musculoskeletal: Positive for back pain and myalgias.  Skin: Negative.   Neurological: Negative.   Endo/Heme/Allergies: Negative.   Psychiatric/Behavioral: Positive for depression and substance abuse. The patient is nervous/anxious and has insomnia.     Blood pressure 142/87, pulse 94, temperature 97.5 F (36.4 C), temperature source Oral, resp. rate 17, height 5' 7"  (1.702 m), weight 91.853 kg (202 lb 8 oz), last menstrual period 07/15/2013.Body mass index is 31.71 kg/(m^2).  General Appearance: Fairly Groomed  Engineer, water::  Good  Speech:  Clear and Coherent  Volume:  Decreased  Mood:  Anxious and worried  Affect:  anxious, worried  Thought Process:  Coherent and Goal Directed   Orientation:  Full (Time, Place, and Person)  Thought Content:  symtpoms worries concerns  Suicidal Thoughts:  No  Homicidal Thoughts:  No  Memory:  Immediate;   Fair Recent;   Fair Remote;   Fair  Judgement:  Fair  Insight:  Present  Psychomotor Activity:  Restlessness  Concentration:  Fair  Recall:  [employer] of Knowledge:NA  Language: Fair  Akathisia:  No  Handed:    AIMS (if indicated):     Assets:  Desire for Improvement  Sleep:      Musculoskeletal: Strength & Muscle Tone: within normal limits Gait & Station: normal Patient leans: N/A  Current Medications: Current Facility-Administered Medications  Medication Dose Route Frequency Provider Last Rate Last Dose  . acetaminophen (TYLENOL) tablet 650 mg  650 mg Oral Q6H PRN Laverle Hobby, PA-C   650 mg at 08/25/13 0737  . alum & mag hydroxide-simeth (MAALOX/MYLANTA) 200-200-20 MG/5ML suspension 30 mL  30 mL Oral Q4H PRN Laverle Hobby, PA-C      . atomoxetine (STRATTERA) capsule 25 mg  25 mg Oral Daily Nicholaus Bloom, MD   25 mg at 08/26/13 1304  . buPROPion (WELLBUTRIN XL) 24 hr tablet 150 mg  150 mg Oral Daily Nicholaus Bloom, MD   150 mg at 08/26/13 0756  . gabapentin (NEURONTIN) capsule 100 mg  100 mg Oral TID Nicholaus Bloom, MD   100 mg at 08/26/13 1200  . hydrOXYzine (ATARAX/VISTARIL) tablet 25 mg  25 mg Oral Q6H PRN Laverle Hobby, PA-C   25 mg at 08/26/13 1457  . lamoTRIgine (LAMICTAL) tablet 25 mg  25  mg Oral QHS Nicholaus Bloom, MD      . magnesium hydroxide (MILK OF MAGNESIA) suspension 30 mL  30 mL Oral Daily PRN Laverle Hobby, PA-C      . meloxicam (MOBIC) tablet 7.5 mg  7.5 mg Oral BID Nicholaus Bloom, MD   7.5 mg at 08/26/13 0756  . nicotine (NICODERM CQ - dosed in mg/24 hours) patch 21 mg  21 mg Transdermal Q0600 Nicholaus Bloom, MD   21 mg at 08/26/13 0757  . traZODone (DESYREL) tablet 100 mg  100 mg Oral QHS,MR X 1 Laverle Hobby, PA-C   100 mg at 08/25/13 2203    Lab Results:  Results for orders placed  during the hospital encounter of 08/24/13 (from the past 48 hour(s))  ACETAMINOPHEN LEVEL     Status: None   Collection Time    08/24/13  5:11 PM      Result Value Ref Range   Acetaminophen (Tylenol), Serum <15.0  10 - 30 ug/mL   Comment:            THERAPEUTIC CONCENTRATIONS VARY     SIGNIFICANTLY. A RANGE OF 10-30     ug/mL MAY BE AN EFFECTIVE     CONCENTRATION FOR MANY PATIENTS.     HOWEVER, SOME ARE BEST TREATED     AT CONCENTRATIONS OUTSIDE THIS     RANGE.     ACETAMINOPHEN CONCENTRATIONS     >150 ug/mL AT 4 HOURS AFTER     INGESTION AND >50 ug/mL AT 12     HOURS AFTER INGESTION ARE     OFTEN ASSOCIATED WITH TOXIC     REACTIONS.  CBC     Status: Abnormal   Collection Time    08/24/13  5:11 PM      Result Value Ref Range   WBC 17.3 (*) 4.0 - 10.5 K/uL   RBC 4.44  3.87 - 5.11 MIL/uL   Hemoglobin 15.3 (*) 12.0 - 15.0 g/dL   HCT 43.3  36.0 - 46.0 %   MCV 97.5  78.0 - 100.0 fL   MCH 34.5 (*) 26.0 - 34.0 pg   MCHC 35.3  30.0 - 36.0 g/dL   RDW 14.6  11.5 - 15.5 %   Platelets 446 (*) 150 - 400 K/uL  COMPREHENSIVE METABOLIC PANEL     Status: Abnormal   Collection Time    08/24/13  5:11 PM      Result Value Ref Range   Sodium 137  137 - 147 mEq/L   Potassium 3.7  3.7 - 5.3 mEq/L   Chloride 97  96 - 112 mEq/L   CO2 21  19 - 32 mEq/L   Glucose, Bld 111 (*) 70 - 99 mg/dL   BUN 12  6 - 23 mg/dL   Creatinine, Ser 0.66  0.50 - 1.10 mg/dL   Calcium 10.0  8.4 - 10.5 mg/dL   Total Protein 7.9  6.0 - 8.3 g/dL   Albumin 4.0  3.5 - 5.2 g/dL   AST 17  0 - 37 U/L   ALT <5  0 - 35 U/L   Alkaline Phosphatase 99  39 - 117 U/L   Total Bilirubin 0.5  0.3 - 1.2 mg/dL   GFR calc non Af Amer >90  >90 mL/min   GFR calc Af Amer >90  >90 mL/min   Comment: (NOTE)     The eGFR has been calculated using the CKD EPI equation.     This  calculation has not been validated in all clinical situations.     eGFR's persistently <90 mL/min signify possible Chronic Kidney     Disease.  ETHANOL      Status: None   Collection Time    08/24/13  5:11 PM      Result Value Ref Range   Alcohol, Ethyl (B) <11  0 - 11 mg/dL   Comment:            LOWEST DETECTABLE LIMIT FOR     SERUM ALCOHOL IS 11 mg/dL     FOR MEDICAL PURPOSES ONLY  SALICYLATE LEVEL     Status: Abnormal   Collection Time    08/24/13  5:11 PM      Result Value Ref Range   Salicylate Lvl <9.5 (*) 2.8 - 20.0 mg/dL  HIV ANTIBODY (ROUTINE TESTING)     Status: None   Collection Time    08/24/13  5:11 PM      Result Value Ref Range   HIV 1&2 Ab, 4th Generation NONREACTIVE  NONREACTIVE   Comment: (NOTE)     A NONREACTIVE HIV Ag/Ab result does not exclude HIV infection since     the time frame for seroconversion is variable. If acute HIV infection     is suspected, a HIV-1 RNA Qualitative TMA test is recommended.     HIV-1/2 Antibody Diff         Not indicated.     HIV-1 RNA, Qual TMA           Not indicated.     PLEASE NOTE: This information has been disclosed to you from records     whose confidentiality may be protected by state law. If your state     requires such protection, then the state law prohibits you from making     any further disclosure of the information without the specific written     consent of the person to whom it pertains, or as otherwise permitted     by law. A general authorization for the release of medical or other     information is NOT sufficient for this purpose.     The performance of this assay has not been clinically validated in     patients less than 62 years old.     Performed at [facility], ACUTE     Status: Abnormal   Collection Time    08/24/13  5:11 PM      Result Value Ref Range   Hepatitis B Surface Ag NEGATIVE  NEGATIVE   HCV Ab Reactive (*) NEGATIVE   Comment: (NOTE)                                                                               This test is for screening purposes only.  Reactive results should be     confirmed by an alternative method.   Suggest HCV Qualitative, PCR,     test code 83130.  Specimens will be stable for reflex testing up to 3     days after collection.   Hep A IgM NON REACTIVE  NON REACTIVE   Hep B C IgM NON REACTIVE  NON REACTIVE   Comment: (NOTE)     High levels of Hepatitis B Core IgM antibody are detectable     during the acute stage of Hepatitis B. This antibody is used     to differentiate current from past HBV infection.     Performed at [place] (HOSP PERFORMED)     Status: Abnormal   Collection Time    08/24/13  5:54 PM      Result Value Ref Range   Opiates POSITIVE (*) NONE DETECTED   Cocaine POSITIVE (*) NONE DETECTED   Benzodiazepines POSITIVE (*) NONE DETECTED   Amphetamines NONE DETECTED  NONE DETECTED   Tetrahydrocannabinol NONE DETECTED  NONE DETECTED   Barbiturates NONE DETECTED  NONE DETECTED   Comment:            DRUG SCREEN FOR MEDICAL PURPOSES     ONLY.  IF CONFIRMATION IS NEEDED     FOR ANY PURPOSE, NOTIFY LAB     WITHIN 5 DAYS.                LOWEST DETECTABLE LIMITS     FOR URINE DRUG SCREEN     Drug Class       Cutoff (ng/mL)     Amphetamine      1000     Barbiturate      200     Benzodiazepine   222     Tricyclics       979     Opiates          300     Cocaine          300     THC              50  POC URINE PREG, ED     Status: None   Collection Time    08/24/13  6:01 PM      Result Value Ref Range   Preg Test, Ur NEGATIVE  NEGATIVE   Comment:            THE SENSITIVITY OF THIS     METHODOLOGY IS >24 mIU/mL    Physical Findings: AIMS: Facial and Oral Movements Muscles of Facial Expression: None, normal Lips and Perioral Area: None, normal Jaw: None, normal Tongue: None, normal,Extremity Movements Upper (arms, wrists, hands, fingers): None, normal Lower (legs, knees, ankles, toes): None, normal, Trunk Movements Neck, shoulders, hips: None, normal, Overall Severity Severity of abnormal movements (highest score from  questions above): None, normal Incapacitation due to abnormal movements: None, normal, Dental Status Current problems with teeth and/or dentures?: No Does patient usually wear dentures?: No  CIWA:  CIWA-Ar Total: 1 COWS:     Treatment Plan Summary: Daily contact with patient to assess and evaluate symptoms and progress in treatment Medication management  Plan: Supportive approach/coping skills/relapse prevention          Optimize treatement with psychotropics          Trial with Strattera and Lamictal  Medical Decision Making Problem Points:  Review of psycho-social stressors (1) Data Points:  Review of medication regiment & side effects (2) Review of new medications or change in dosage (2)  I certify that inpatient services furnished can reasonably be expected to improve the patient's condition.   Nicholaus Bloom 08/26/2013, 4:01 PM

## 2013-08-26 NOTE — Progress Notes (Signed)
Attended group

## 2013-08-26 NOTE — Progress Notes (Signed)
Pt reported her sleep fair her appetite good energy low and ability to pay attention as poor.  Depression 8 hopelessness 5 and anxiety a 7 on her self-inventory.  Denies any S/H ideations or A/V hallucinations. Pt wants to leave here and go straight to [facility].

## 2013-08-26 NOTE — Progress Notes (Signed)
D: Pt denies SI/HI/AVH. Pt is pleasant and cooperative. Pt did forward little info, but pt stated she had 2 rods in her back which limited her mobility.   A: Pt was offered support and encouragement. Pt was given scheduled medications. Pt was encourage to attend groups. Q 15 minute checks were done for safety.   R:Pt attends groups and interacts well with peers and staff. Pt is taking medication. Pt receptive to treatment and safety maintained on unit.

## 2013-08-26 NOTE — Tx Team (Signed)
Interdisciplinary Treatment Plan Update (Adult)  Date: 08/26/2013  Time Reviewed:  9:45 AM  Progress in Treatment: Attending groups: Yes Participating in groups:  Yes Taking medication as prescribed:  Yes Tolerating medication:  Yes Family/Significant othe contact made: Yes, with pt's mother Patient understands diagnosis:  Yes Discussing patient identified problems/goals with staff:  Yes Medical problems stabilized or resolved:  Yes Denies suicidal/homicidal ideation: Yes Issues/concerns per patient self-inventory:  Yes Other:  New problem(s) identified: N/A  Discharge Plan or Barriers: CSW is working on referring pt to [facility] for further inpatient treatment and has follow up with [facility] for outpatient treatment after inpatient.    Reason for Continuation of Hospitalization: Anxiety Depression Medication Stabilization Detox  Comments: N/A  Estimated length of stay: 3-5 days  For review of initial/current patient goals, please see plan of care.  Attendees: Patient:     Family:     Physician:  Dr. Sabra Heck 08/26/2013 10:19 AM   Nursing:   Gaylan Gerold, RN 08/26/2013 10:19 AM   Clinical Social Worker:  Regan Lemming, LCSW 08/26/2013 10:19 AM   Other: Lars Pinks, RN case manager 08/26/2013 10:19 AM   Other:  Maxie Better, [employer] 08/26/2013 10:19 AM   Other:  Agustina Caroli, NP 08/26/2013 10:19 AM   Other:  Para March, RN 08/26/2013 10:19 AM   Other: Norberto Sorenson, care coordinator 08/26/2013 10:20 AM   Other: Jiles Garter, pharmacist 08/26/2013 10:20 AM   Other:    Other:    Other:    Other:     Scribe for Treatment Team:   Ane Payment, 08/26/2013 , 10:19 AM

## 2013-08-27 DIAGNOSIS — F909 Attention-deficit hyperactivity disorder, unspecified type: Secondary | ICD-10-CM

## 2013-08-27 MED ORDER — LORATADINE 10 MG PO TABS
10.0000 mg | ORAL_TABLET | Freq: Every day | ORAL | Status: DC | PRN
  Administered 2013-08-27 – 2013-09-01 (×6): 10 mg via ORAL
  Filled 2013-08-27 (×5): qty 1
  Filled 2013-08-27: qty 14
  Filled 2013-08-27: qty 1

## 2013-08-27 MED ORDER — GABAPENTIN 100 MG PO CAPS
200.0000 mg | ORAL_CAPSULE | Freq: Once | ORAL | Status: AC
  Administered 2013-08-27: 200 mg via ORAL
  Filled 2013-08-27 (×2): qty 2

## 2013-08-27 MED ORDER — IBUPROFEN 200 MG PO TABS
400.0000 mg | ORAL_TABLET | ORAL | Status: DC | PRN
  Administered 2013-08-27 – 2013-09-01 (×9): 400 mg via ORAL
  Filled 2013-08-27 (×9): qty 2

## 2013-08-27 MED ORDER — GABAPENTIN 300 MG PO CAPS
300.0000 mg | ORAL_CAPSULE | Freq: Three times a day (TID) | ORAL | Status: DC
  Administered 2013-08-28 – 2013-08-30 (×8): 300 mg via ORAL
  Filled 2013-08-27 (×2): qty 1
  Filled 2013-08-27: qty 42
  Filled 2013-08-27 (×3): qty 1
  Filled 2013-08-27: qty 42
  Filled 2013-08-27 (×2): qty 1
  Filled 2013-08-27: qty 42
  Filled 2013-08-27 (×4): qty 1

## 2013-08-27 NOTE — [facility] Group Notes (Signed)
[facility] Group Notes:  (Nursing/MHT/Case Management/Adjunct)  Date:  08/27/2013  Time:  9:57 AM  Type of Therapy:  Psychoeducational Skills  Participation Level:  Did Not Attend  Participation Quality:    Affect:    Cognitive:    Insight:    Engagement in Group:    Modes of Intervention:    Summary of Progress/Problems: Morning Well[facility]  [place] 08/27/2013, 9:57 AM

## 2013-08-27 NOTE — Progress Notes (Signed)
Patient ID: Kelly [name], female   DOB: 1986/01/31, 28 y.o.   MRN: [id number]  D: Pt informed the writer that she scheduled to be discharged to [facility]. When asked how she felt about it, pt stated, "I don't have a choice. I"ve got to go to [facility]. I can't live like this any more. Either I'll die or lose my son". However, pt stated she doesn't want to wait until Wed to go. Stated she's ready to get started with rehab.   A:  Support and encouragement was offered. 15 min checks continued for safety.  R: Pt remains safe.

## 2013-08-27 NOTE — Progress Notes (Signed)
Patient ID: Kelly [name], female   DOB: 1985-09-21, 28 y.o.   MRN: [id number] [facility] MD Progress Note  08/27/2013 1:18 PM VALE PERAZA  MRN:  [id number]  Subjective: Kelly [name] reports today that she is feeling much better emotionally. Says her withdrawal symptoms are less as to prior to her admission. She is looking forward to getting into [facility] treatment center after discharge, her first ever treatment (rehab) since her addiction begun. However, says she is stressing about her addiction situation, her son and her liver functions. would want a pregnancy tests done, hasn't had her periods since the first week of January.  O: Review of recent lab results indicated negative pregnancy results for HCG tests done on 08/19/11 & 08/24/13 respectively.  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   HENT: Negative.   Eyes: Negative.   Respiratory: Negative.   Cardiovascular: Negative.   Gastrointestinal: Negative.   Genitourinary: Negative.   Musculoskeletal: Positive for back pain and myalgias.  Skin: Negative.   Neurological: Negative.   Endo/Heme/Allergies: Negative.   Psychiatric/Behavioral: Positive for depression and substance abuse. The patient is nervous/anxious and has insomnia.     Blood pressure 131/89, pulse 120, temperature 97.9 F (36.6 C), temperature  source Oral, resp. rate 20, height 5' 7"  (1.702 m), weight 91.853 kg (202 lb 8 oz), last menstrual period 07/15/2013.Body mass index is 31.71 kg/(m^2).  General Appearance: Fairly Groomed  Engineer, water::  Good  Speech:  Clear and Coherent  Volume:  Decreased  Mood:  Anxious and worried  Affect:  anxious, worried  Thought Process:  Coherent and Goal Directed  Orientation:  Full (Time, Place, and Person)  Thought Content:  symtpoms worries concerns  Suicidal Thoughts:  No  Homicidal Thoughts:  No  Memory:  Immediate;   Fair Recent;   Fair Remote;   Fair  Judgement:  Fair  Insight:  Present  Psychomotor Activity:  Restlessness  Concentration:  Fair  Recall:  [employer] of Knowledge:NA  Language: Fair  Akathisia:  No  Handed:    AIMS (if indicated):     Assets:  Desire for Improvement  Sleep:  Number of Hours: 6   Musculoskeletal: Strength & Muscle Tone: within normal limits Gait & Station: normal Patient leans: N/A  Current Medications: Current Facility-Administered Medications  Medication Dose Route Frequency Provider Last Rate Last Dose  . acetaminophen (TYLENOL) tablet 650 mg  650 mg Oral Q6H PRN Laverle Hobby, PA-C   650 mg at 08/26/13 1601  . alum & mag hydroxide-simeth (MAALOX/MYLANTA) 200-200-20 MG/5ML suspension 30 mL  30 mL Oral Q4H PRN Laverle Hobby, PA-C      . atomoxetine (STRATTERA) capsule 25 mg  25 mg Oral Daily Nicholaus Bloom, MD   25 mg at 08/27/13 0805  . buPROPion (WELLBUTRIN XL) 24 hr tablet 150 mg  150 mg Oral Daily Nicholaus Bloom, MD   150 mg at 08/27/13 0805  .  gabapentin (NEURONTIN) capsule 100 mg  100 mg Oral TID Nicholaus Bloom, MD   100 mg at 08/27/13 1153  . hydrOXYzine (ATARAX/VISTARIL) tablet 25 mg  25 mg Oral Q6H PRN Laverle Hobby, PA-C   25 mg at 08/26/13 2224  . lamoTRIgine (LAMICTAL) tablet 25 mg  25 mg Oral QHS Nicholaus Bloom, MD   25 mg at 08/26/13 2200  . loratadine (CLARITIN) tablet 10 mg  10 mg Oral Daily PRN Laverle Hobby, PA-C   10 mg  at 08/27/13 8182  . magnesium hydroxide (MILK OF MAGNESIA) suspension 30 mL  30 mL Oral Daily PRN Laverle Hobby, PA-C      . meloxicam (MOBIC) tablet 7.5 mg  7.5 mg Oral BID Nicholaus Bloom, MD   7.5 mg at 08/27/13 0805  . nicotine (NICODERM CQ - dosed in mg/24 hours) patch 21 mg  21 mg Transdermal Q0600 Nicholaus Bloom, MD   21 mg at 08/27/13 [phone]  . traZODone (DESYREL) tablet 100 mg  100 mg Oral QHS,MR X 1 Laverle Hobby, PA-C   100 mg at 08/26/13 2224    Lab Results:  No results found for this or any previous visit (from the past 48 hour(s)).  Physical Findings: AIMS: Facial and Oral Movements Muscles of Facial Expression: None, normal Lips and Perioral Area: None, normal Jaw: None, normal Tongue: None, normal,Extremity Movements Upper (arms, wrists, hands, fingers): None, normal Lower (legs, knees, ankles, toes): None, normal, Trunk Movements Neck, shoulders, hips: None, normal, Overall Severity Severity of abnormal movements (highest score from questions above): None, normal Incapacitation due to abnormal movements: None, normal Patient's awareness of abnormal movements (rate only patient's report): No Awareness, Dental Status Current problems with teeth and/or dentures?: No Does patient usually wear dentures?: No  CIWA:  CIWA-Ar Total: 1 COWS:     Treatment Plan Summary: Daily contact with patient to assess and evaluate symptoms and progress in treatment Medication management  Plan: Supportive approach/coping skills/relapse prevention Optimize treatement with psychotropics Continue Strattera and Lamictal. Reviewed current lab reports, negative pregnancy results on HCG tests 08-18-13 & 08/24/13 respectively. D/c tylenol, order Ibuprofen 200 mg prn for pain episodes.  Medical Decision Making Problem Points:  Review of psycho-social stressors (1) Data Points:  Review of medication regiment & side effects (2) Review of new medications or change in dosage (2)  I certify that  inpatient services furnished can reasonably be expected to improve the patient's condition.   [name], PMHNP, FNP-BC 08/27/2013, 1:18 PM Personally evaluated the patient and participated in the assessment and disposition Sondos Wolfman A. Sabra Heck, M.D.

## 2013-08-27 NOTE — Progress Notes (Signed)
D: Pt denies SI/HI/AV. Pt is pleasant and cooperative. Pt feeling remorse for things she did when she was under the influence of drugs.   A: Pt was offered support and encouragement. Pt was given scheduled medications. Pt was encourage to attend groups. Q 15 minute checks were done for safety. Informed pt she can't blame herself for things she did under the influence of drugs, because she was a different person, she has to deal with the consequences of the actions.  R:Pt attends groups and interacts well with peers and staff. Pt is taking medication. Pt receptive to treatment and safety maintained on unit. Pt seemed a lot less self-bashing.

## 2013-08-27 NOTE — Progress Notes (Deleted)
D: Pt denies SI/HI/AVH. Pt is pleasant and cooperative. Pt wanted info on the [facility] program, pt stated she called them today, but the person she needed to speak to would not be there until tomorrow.   A: Pt was offered support and encouragement. Pt was given scheduled medications. Pt was encourage to attend groups. Q 15 minute checks were done for safety.   R:Pt attends groups and interacts well with peers and staff. Pt is taking medication.Pt receptive to treatment and safety maintained on unit.

## 2013-08-27 NOTE — Progress Notes (Signed)
Adult Psychoeducational Group Note  Date:  08/27/2013 Time:  11:16 PM  Group Topic/Focus:  Wrap-Up Group:   The focus of this group is to help patients review their daily goal of treatment and discuss progress on daily workbooks.  Participation Level:  Active  Participation Quality:  Appropriate  Affect:  Appropriate  Cognitive:  Appropriate  Insight: Appropriate  Engagement in Group:  Engaged  Modes of Intervention:  Activity  Additional Comments:  Pt attended karaoke group in cafeteria.  Kelly [name] 08/27/2013, 11:16 PM

## 2013-08-27 NOTE — [facility] Group Notes (Signed)
[facility] LCSW Group Therapy  08/27/2013 2:47 PM  Type of Therapy:  Group Therapy  Participation Level:  Active  Participation Quality:  Attentive  Affect:  Appropriate  Cognitive:  Alert and Oriented  Insight:  Improving  Engagement in Therapy:  Engaged  Modes of Intervention:  Confrontation, Discussion, Education, Exploration, Problem-solving, Rapport Building, Role-play, Socialization and Support  Summary of Progress/Problems:  Finding Balance in Life. Today's group focused on defining balance in one's own words, identifying things that can knock one off balance, and exploring healthy ways to maintain balance in life. Group members were asked to provide an example of a time when they felt off balance, describe how they handled that situation,and process healthier ways to regain balance in the future. Group members were asked to share the most important tool for maintaining balance that they learned while at [facility] and how they plan to apply this method after discharge. Kelly [name] was attentive and engaged throughout today's therapy group. She shared that unhealthy relationships often cause imbalance and often stem from substance abuse issues. Kelly [name] role played a scenario with another pt and practiced how to ask for something in a respectful, non confrontational manner. Kelly [name] shows progress in the group setting and improving insight AEB her ability to process how "exercising and taking better care of my body" will help her achieve a sense of physical balance, in addition to remaining sober.    Charistopher Rumble Smart LCSWA  08/27/2013, 2:47 PM

## 2013-08-27 NOTE — Progress Notes (Signed)
Recreation Therapy Notes  Animal-Assisted Activity/Therapy (AAA/T) Program Checklist/Progress Notes Patient Eligibility Criteria Checklist & Daily Group note for Rec Tx Intervention  Date: 04.16.2015 Time: 2:45pm Location: [address]   AAA/T Program Assumption of Risk Form signed by Patient/ or Parent Legal Guardian yes  Patient is free of allergies or sever asthma yes  Patient reports no fear of animals yes  Patient reports no history of cruelty to animals yes   Patient understands his/her participation is voluntary yes  Behavioral Response: Did not attend.    Laureen Ochs Shalece Staffa, LRT/CTRS  Lane Hacker 08/27/2013 4:23 PM

## 2013-08-27 NOTE — Progress Notes (Signed)
Patient ID: Kelly [name], female   DOB: April 30, 1986, 28 y.o.   MRN: [id number]  D: Patient has been pleasant on approach today. Reports still having depressed mood "7" on scale and hopelessness feelings "5". Reports still having a lot of cravings even having dreams. Currently denies having any SI. Reports seasonal allergies bad today. Received claritin  Today. A: Staff will monitor on q 15 minute checks, follow treatment plan, and give meds as ordered. R: Cooperative on the unit.

## 2013-08-28 MED ORDER — NICOTINE POLACRILEX 2 MG MT GUM
2.0000 mg | CHEWING_GUM | OROMUCOSAL | Status: DC | PRN
  Administered 2013-08-28 – 2013-09-01 (×9): 2 mg via ORAL
  Filled 2013-08-28: qty 1

## 2013-08-28 MED ORDER — AZITHROMYCIN 500 MG PO TABS
500.0000 mg | ORAL_TABLET | Freq: Every day | ORAL | Status: AC
  Administered 2013-08-28 – 2013-08-30 (×3): 500 mg via ORAL
  Filled 2013-08-28 (×3): qty 1
  Filled 2013-08-28: qty 2

## 2013-08-28 MED ORDER — NICOTINE POLACRILEX 2 MG MT GUM
CHEWING_GUM | OROMUCOSAL | Status: AC
  Filled 2013-08-28: qty 1

## 2013-08-28 NOTE — Progress Notes (Signed)
Patient ID: Kelly [name], female   DOB: [birth year], 28 y.o.   MRN: [id number] 08-28-13 report given to brook m...sbw,rn

## 2013-08-28 NOTE — Progress Notes (Signed)
D. Pt has been up and has been active in milieu today, attending in various activities. Pt spoke about her anxiety and how she feels that she will relapse when she leaves here and also spoke about how she is still having mood instability. Pt did report small rash on her back and hs dose of lamictal held until further evaluation by doctor. Pt does not display and does not complain about any overt reactions. A. Support and encouragement provided, medication education given. R. Pt verbalized understanding, safety maintained.

## 2013-08-28 NOTE — Progress Notes (Signed)
Pt reports poor sleep overnight due to congestion and cough. Asking to be evaluated for possible antibiotic as she reports coughing up green colored mucous. Encouraged pt to speak with provider this morning. Claritin admin and fluids encouraged. Pt verbalized understanding. Kelly [name] Tommi Rumps

## 2013-08-28 NOTE — BHH Group Notes (Signed)
[facility] LCSW Aftercare Discharge Planning Group Note   08/28/2013 8:45 AM  Participation Quality:  Alert, Appropriate and Oriented  Mood/Affect:  Calm  Depression Rating:  3  Anxiety Rating:  3  Thoughts of Suicide:  Pt denies SI/HI  Will you contract for safety?   Yes  Current AVH:  Pt denies  Plan for Discharge/Comments:  Pt attended discharge planning group and actively participated in group.  CSW provided pt with today's workbook.  Pt is excited about going to [facility] on Wednesday for further inpatient treatment.  Pt also has follow up scheduled with [facility] for outpatient treatment after inpatient.   Pt has a home in [place] and can return home after treatment.  No further needs voiced by pt at this time.    Transportation Means: Pt reports access to transportation - mother will transport pt to [facility] on Wednesday  Supports: No supports mentioned at this time  Kelly [name], [place] 08/28/2013 10:22 AM

## 2013-08-28 NOTE — Progress Notes (Signed)
Adult Psychoeducational Group Note  Date:  08/28/2013 Time:  2:15 PM  Group Topic/Focus:  Early Warning Signs:   The focus of this group is to help patients identify signs or symptoms they exhibit before slipping into an unhealthy state or crisis.  Participation Level:  Active  Participation Quality:  Appropriate and Supportive  Affect:  Appropriate  Cognitive:  Appropriate  Insight: Appropriate  Engagement in Group:  Engaged and Supportive  Modes of Intervention:  Discussion, Education, Socialization and Support  Additional Comments:  Pts discussed early warning signs that contribute to their relapse. Pt stated her warning sign is feeling ashamed and worthless. Pt stated she enjoys spending time with her son, exercising, and riding motorcycles and this helps her when she starts experiencing those warning signs.  Clint Bolder 08/28/2013, 2:15 PM

## 2013-08-28 NOTE — Progress Notes (Signed)
[facility] MD Progress Note  08/28/2013 12:42 PM [name]  MRN:  [id number] Subjective:  Kelly [name] is having a lot of congestion states she suffers from sinusitis and that she usually takes Zithromax when it gets this bad. (Describes greenish secretions) She is still dealing with some of the same worries and concerns. She is still feels some mood instability. She is concerned about relapsing. The thought of the possibility of relapse increases her anxiety. Diagnosis:   DSM5: Schizophrenia Disorders:  none Obsessive-Compulsive Disorders:  none Trauma-Stressor Disorders:  none Substance/Addictive Disorders:  Opioid Disorder - Severe (304.00), Cocaine related mood disorder Depressive Disorders:  Major Depressive Disorder - Moderate (296.22) Total Time spent with patient: 30 minutes  Axis I: ADHD, combined type and Mood Disorder NOS  ADL's:  Intact  Sleep: Poor  Appetite:  Fair  Suicidal Ideation:  Plan:  denies Intent:  denies Means:  denies Homicidal Ideation:  Plan:  denies Intent:  denies Means:  denies AEB (as evidenced by):  Psychiatric Specialty Exam: Physical Exam  Review of Systems  Constitutional: Negative.   HENT: Positive for congestion.        Sinus problems  Eyes: Negative.   Respiratory: Negative.   Cardiovascular: Negative.   Gastrointestinal: Negative.   Genitourinary: Negative.   Musculoskeletal: Negative.   Skin: Negative.   Neurological: Negative.   Endo/Heme/Allergies: Negative.   Psychiatric/Behavioral: Positive for substance abuse. The patient is nervous/anxious.     Blood pressure 138/105, pulse 85, temperature 97.7 F (36.5 C), temperature source Oral, resp. rate 16, height 5' 7"  (1.702 m), weight 91.853 kg (202 lb 8 oz), last menstrual period 07/15/2013.Body mass index is 31.71 kg/(m^2).  General Appearance: Fairly Groomed  Engineer, water::  Fair  Speech:  Clear and Coherent and congested  Volume:  Decreased  Mood:  Anxious and worried   Affect:  anxious, worried  Thought Process:  Coherent and Goal Directed  Orientation:  Full (Time, Place, and Person)  Thought Content:  symtpoms, worries, concerns  Suicidal Thoughts:  No  Homicidal Thoughts:  No  Memory:  Immediate;   Fair Recent;   Fair Remote;   Fair  Judgement:  Fair  Insight:  Present  Psychomotor Activity:  Restlessness  Concentration:  Fair  Recall:  [employer] of Knowledge:NA  Language: Fair  Akathisia:  No  Handed:    AIMS (if indicated):     Assets:  Desire for Improvement Housing Social Support  Sleep:  Number of Hours: 6   Musculoskeletal: Strength & Muscle Tone: within normal limits Gait & Station: normal Patient leans: N/A  Current Medications: Current Facility-Administered Medications  Medication Dose Route Frequency Provider Last Rate Last Dose  . acetaminophen (TYLENOL) tablet 650 mg  650 mg Oral Q6H PRN Laverle Hobby, PA-C   650 mg at 08/26/13 1601  . alum & mag hydroxide-simeth (MAALOX/MYLANTA) 200-200-20 MG/5ML suspension 30 mL  30 mL Oral Q4H PRN Laverle Hobby, PA-C      . atomoxetine (STRATTERA) capsule 25 mg  25 mg Oral Daily Nicholaus Bloom, MD   25 mg at 08/28/13 0757  . azithromycin (ZITHROMAX) tablet 500 mg  500 mg Oral Daily Nicholaus Bloom, MD   500 mg at 08/28/13 1104  . buPROPion (WELLBUTRIN XL) 24 hr tablet 150 mg  150 mg Oral Daily Nicholaus Bloom, MD   150 mg at 08/28/13 0757  . gabapentin (NEURONTIN) capsule 300 mg  300 mg Oral TID Nicholaus Bloom, MD  300 mg at 08/28/13 1104  . hydrOXYzine (ATARAX/VISTARIL) tablet 25 mg  25 mg Oral Q6H PRN Laverle Hobby, PA-C   25 mg at 08/26/13 2224  . ibuprofen (ADVIL,MOTRIN) tablet 400 mg  400 mg Oral Q4H PRN [name], NP   400 mg at 08/27/13 1410  . lamoTRIgine (LAMICTAL) tablet 25 mg  25 mg Oral QHS Nicholaus Bloom, MD   25 mg at 08/27/13 2146  . loratadine (CLARITIN) tablet 10 mg  10 mg Oral Daily PRN Laverle Hobby, PA-C   10 mg at 08/28/13 4656  . magnesium hydroxide (MILK OF  MAGNESIA) suspension 30 mL  30 mL Oral Daily PRN Laverle Hobby, PA-C      . meloxicam (MOBIC) tablet 7.5 mg  7.5 mg Oral BID Nicholaus Bloom, MD   7.5 mg at 08/28/13 0757  . nicotine (NICODERM CQ - dosed in mg/24 hours) patch 21 mg  21 mg Transdermal Q0600 Nicholaus Bloom, MD   21 mg at 08/28/13 0755  . traZODone (DESYREL) tablet 100 mg  100 mg Oral QHS,MR X 1 Laverle Hobby, PA-C   100 mg at 08/27/13 2247    Lab Results: No results found for this or any previous visit (from the past 48 hour(s)).  Physical Findings: AIMS: Facial and Oral Movements Muscles of Facial Expression: None, normal Lips and Perioral Area: None, normal Jaw: None, normal Tongue: None, normal,Extremity Movements Upper (arms, wrists, hands, fingers): None, normal Lower (legs, knees, ankles, toes): None, normal, Trunk Movements Neck, shoulders, hips: None, normal, Overall Severity Severity of abnormal movements (highest score from questions above): None, normal Incapacitation due to abnormal movements: None, normal Patient's awareness of abnormal movements (rate only patient's report): No Awareness, Dental Status Current problems with teeth and/or dentures?: No Does patient usually wear dentures?: No  CIWA:  CIWA-Ar Total: 1 COWS:     Treatment Plan Summary: Daily contact with patient to assess and evaluate symptoms and progress in treatment Medication management  Plan: Supportive approach/coping skills/relapse prevention           Address the congestion/sinusitis: Zithromax           Optimize response to medications              Medical Decision Making Problem Points:  Review of psycho-social stressors (1) Data Points:  Review of medication regiment & side effects (2) Review of new medications or change in dosage (2)  I certify that inpatient services furnished can reasonably be expected to improve the patient's condition.   Nicholaus Bloom 08/28/2013, 12:42 PM

## 2013-08-28 NOTE — BHH Group Notes (Signed)
[facility] LCSW Group Therapy  08/28/2013  1:15 PM   Type of Therapy:  Group Therapy  Participation Level:  Active  Participation Quality:  Attentive, Sharing and Supportive  Affect:  Depressed and Flat  Cognitive:  Alert and Oriented  Insight:  Developing/Improving and Engaged  Engagement in Therapy:  Developing/Improving and Engaged  Modes of Intervention:  Clarification, Confrontation, Discussion, Education, Exploration, Limit-setting, Orientation, Problem-solving, Rapport Building, Art therapist, Socialization and Support  Summary of Progress/Problems: The topic for today was feelings about relapse.  Pt discussed what relapse prevention is to them and identified triggers that they are on the path to relapse.  Pt processed their feeling towards relapse and was able to relate to peers.  Pt discussed coping skills that can be used for relapse prevention.  Pt shared that she has a lot of fear around relapse as this is her first time really trying to stop using and using is routine and a habit for her, as she's used since 28 years old.  Pt actively participated and was engaged in group discussion.   Regan Lemming, LCSW 08/28/2013  2:14 PM

## 2013-08-28 NOTE — Tx Team (Signed)
Interdisciplinary Treatment Plan Update (Adult)  Date: 08/28/2013  Time Reviewed:  9:45 AM  Progress in Treatment: Attending groups: Yes Participating in groups:  Yes Taking medication as prescribed:  Yes Tolerating medication:  Yes Family/Significant othe contact made: Yes, with pt's mother Patient understands diagnosis:  Yes Discussing patient identified problems/goals with staff:  Yes Medical problems stabilized or resolved:  Yes Denies suicidal/homicidal ideation: Yes Issues/concerns per patient self-inventory:  Yes Other:  New problem(s) identified: N/A  Discharge Plan or Barriers: Pt is scheduled to go to [facility] on Wednesday for further inpatient treatment.  Pt is also has follow up at [facility] for outpatient treatment.    Reason for Continuation of Hospitalization: Anxiety Depression Medication Stabilization Detox  Comments: N/A  Estimated length of stay: 5 days, d/c Wednesday  For review of initial/current patient goals, please see plan of care.  Attendees: Patient:     Family:     Physician:  Dr. Sabra Heck 08/28/2013 10:48 AM   Nursing:   Waunita Schooner, RN 08/28/2013 10:48 AM   Clinical Social Worker:  Regan Lemming, LCSW 08/28/2013 10:48 AM   Other: Lars Pinks, RN case manager 08/28/2013 10:48 AM   Other:  Agustina Caroli, NP 08/28/2013 10:48 AM   Other:     Other:     Other:    Other:    Other:    Other:    Other:    Other:     Scribe for Treatment Team:   Ane Payment, 08/28/2013 , 10:48 AM

## 2013-08-28 NOTE — Progress Notes (Signed)
[facility] Group Notes:  (Nursing/MHT/Case Management/Adjunct)  Date:  08/28/2013  Time:  3:19 PM  Type of Therapy:  Therapeutic Activity  Participation Level:  Active  Participation Quality:  Appropriate  Affect:  Appropriate  Cognitive:  Appropriate  Insight:  Appropriate  Engagement in Group:  Engaged and Supportive  Modes of Intervention:  Activity  Summary of Progress/Problems: Pts played a game using the Therapeutic Activity Ball.  Kelly [name] 08/28/2013, 3:19 PM

## 2013-08-28 NOTE — Progress Notes (Signed)
Pt attended AA group this evening.  

## 2013-08-29 DIAGNOSIS — F39 Unspecified mood [affective] disorder: Secondary | ICD-10-CM

## 2013-08-29 MED ORDER — FLUTICASONE PROPIONATE 50 MCG/ACT NA SUSP
1.0000 | Freq: Every day | NASAL | Status: DC
  Administered 2013-08-29 – 2013-09-01 (×4): 1 via NASAL
  Filled 2013-08-29 (×2): qty 16

## 2013-08-29 MED ORDER — TRAZODONE HCL 150 MG PO TABS
150.0000 mg | ORAL_TABLET | Freq: Every evening | ORAL | Status: DC | PRN
  Administered 2013-08-29: 150 mg via ORAL
  Filled 2013-08-29 (×3): qty 1
  Filled 2013-08-29 (×2): qty 28
  Filled 2013-08-29 (×2): qty 1

## 2013-08-29 NOTE — Progress Notes (Signed)
Pt states she is feeling much better with the initiation of a z-pack and flonase. Denying any other physical problems other than her chronic back pain which is currently an 8/10. States it rarely goes lower since she is not taking her regular narcotics however understands why she cannot have any opiates. Pt given support, medicated per orders. Denies any SI/HI and remains safe. Kelly [name] Tommi Rumps

## 2013-08-29 NOTE — Progress Notes (Signed)
Patient did attend the evening speaker AA meeting.  

## 2013-08-29 NOTE — BHH Group Notes (Signed)
[facility] LCSW Group Therapy  08/29/2013 11:06 AM  Type of Therapy:  Group Therapy  Participation Level:  Minimal  Participation Quality:  Attentive  Affect:  Depressed  Cognitive:  Alert and Oriented  Insight:  Lacking  Engagement in Therapy:  Limited  Modes of Intervention:  Discussion, Exploration, Problem-solving, Rapport Building, Socialization and Support  Summary of Progress/Problems: The main focus of today's process group was to identify the patient's current support system and decide on other supports that can be put in place. An emphasis was placed on using counselor, doctor, therapy groups, 12-step groups, and problem-specific support groups to expand supports, as well as doing something different than has been done before.   Kimble was observed to be in a reserved mood within group however her affect and mood did brighten as group discussion commenced. She shared her perspective towards positive supports as she verbalized feelings of frustration towards staff not being "compassionate or understanding". Kasha ruminated upon feelings of helplessness as she was unable to identify any social or professional supports at this time. Patient demonstrated limited insight to how developing more positive supports could improve her overall wellness and decrease future chances of social isolation and substance abuse as she was unable to examine the underlying correlation.   Harriet Masson. 08/29/2013, 11:06 AM

## 2013-08-29 NOTE — Progress Notes (Signed)
[facility] Group Notes:  (Nursing/MHT/Case Management/Adjunct)  Date:  08/29/2013  Time:  5:20 PM  Type of Therapy:  Psychoeducational Skills  Participation Level:  Active  Participation Quality:  Appropriate and Attentive  Affect:  Appropriate  Cognitive:  Appropriate  Insight:  Appropriate  Engagement in Group:  Engaged and Supportive  Modes of Intervention:  Activity  Summary of Progress/Problems: Pts played coping skills Pictionary.  Clint Bolder 08/29/2013, 5:20 PM

## 2013-08-29 NOTE — BHH Group Notes (Signed)
[facility] Group Notes:  (Nursing/MHT/Case Management/Adjunct)  Date:  08/29/2013  Time:  11:15 AM  Type of Therapy:  Psychoeducational Skills  Participation Level:  Active  Participation Quality:  Appropriate  Affect:  Appropriate  Cognitive:  Appropriate  Insight:  Appropriate  Engagement in Group:  Engaged  Modes of Intervention:  Discussion  Summary of Progress/Problems: Pt did attend self inventory group, pt reported that she was negative SI/HI, no AH/VH noted. Pt rated her depression as a 3, and her helplessness/hopelessness as a 0.     Coye Dawood Shanta Kamilia Carollo 08/29/2013, 11:15 AM

## 2013-08-29 NOTE — Progress Notes (Signed)
Patient ID: Kelly [name], female   DOB: 10-08-1985, 28 y.o.   MRN: [id number] D: Patient is up in the milieu interacting with her peers.  She is attending groups and participating in her treatment.  She rates her depressive symptoms as a 4 and her hopelessness as a 3.  She reports anxiety, agitation and some cravings.  Her plan is to discharge to [facility] on Wednesday and follow up with the [facility].  She presents with brighter affect; her mood is neutral.  She denies any SI/HI/AVH.  A: continue to monitor medication management and MD orders.  Safety checks completed every 15 minutes per protocol.  R: patient is receptive to staff; her behavior is appropriate.

## 2013-08-29 NOTE — Progress Notes (Signed)
Patient ID: Kelly [name], female   DOB: [birth year], 28 y.o.   MRN: [id number] [facility] MD Progress Note  08/29/2013 2:56 PM Kelly [name]  MRN:  [id number]  Subjective:  Kelly [name] is having a lot of congestion. She says she did not sleep well last night because of congestion. Worried that she may be getting a reaction (rash) from Lamictal. Checked out rash, rash seem not newly erupt. She will try Lamictal 1 dose tonight. Will evaluate further.  Diagnosis:   DSM5: Schizophrenia Disorders:  none Obsessive-Compulsive Disorders:  none Trauma-Stressor Disorders:  none Substance/Addictive Disorders:  Opioid Disorder - Severe (304.00), Cocaine related mood disorder Depressive Disorders:  Major Depressive Disorder - Moderate (296.22) Total Time spent with patient: 30 minutes  Axis I: ADHD, combined type and Mood Disorder NOS  ADL's:  Intact  Sleep: Poor  Appetite:  Fair  Suicidal Ideation:  Plan:  denies Intent:  denies Means:  denies Homicidal Ideation:  Plan:  denies Intent:  denies Means:  denies AEB (as evidenced by):  Psychiatric Specialty Exam: Physical Exam  Review of Systems  Constitutional: Negative.   HENT: Positive for congestion.        Sinus problems  Eyes: Negative.   Respiratory: Negative.   Cardiovascular: Negative.   Gastrointestinal: Negative.   Genitourinary: Negative.   Musculoskeletal: Negative.   Skin: Negative.   Neurological: Negative.   Endo/Heme/Allergies: Negative.   Psychiatric/Behavioral: Positive for substance abuse. The patient is nervous/anxious.     Blood pressure 126/91, pulse 76, temperature 97.5 F (36.4 C), temperature source Oral, resp. rate 18, height 5' 7"  (1.702 m), weight 91.853 kg (202 lb 8 oz), last menstrual period 07/15/2013.Body mass index is 31.71 kg/(m^2).  General Appearance: Fairly Groomed  Engineer, water::  Fair  Speech:  Clear and Coherent and congested  Volume:  Decreased  Mood:  Anxious and worried  Affect:   anxious, worried  Thought Process:  Coherent and Goal Directed  Orientation:  Full (Time, Place, and Person)  Thought Content:  symtpoms, worries, concerns  Suicidal Thoughts:  No  Homicidal Thoughts:  No  Memory:  Immediate;   Fair Recent;   Fair Remote;   Fair  Judgement:  Fair  Insight:  Present  Psychomotor Activity:  Restlessness  Concentration:  Fair  Recall:  [employer] of Knowledge:NA  Language: Fair  Akathisia:  No  Handed:    AIMS (if indicated):     Assets:  Desire for Improvement Housing Social Support  Sleep:  Number of Hours: 4.5   Musculoskeletal: Strength & Muscle Tone: within normal limits Gait & Station: normal Patient leans: N/A  Current Medications: Current Facility-Administered Medications  Medication Dose Route Frequency Provider Last Rate Last Dose  . acetaminophen (TYLENOL) tablet 650 mg  650 mg Oral Q6H PRN Laverle Hobby, PA-C   650 mg at 08/26/13 1601  . alum & mag hydroxide-simeth (MAALOX/MYLANTA) 200-200-20 MG/5ML suspension 30 mL  30 mL Oral Q4H PRN Laverle Hobby, PA-C      . atomoxetine (STRATTERA) capsule 25 mg  25 mg Oral Daily Nicholaus Bloom, MD   25 mg at 08/29/13 0758  . azithromycin (ZITHROMAX) tablet 500 mg  500 mg Oral Daily Nicholaus Bloom, MD   500 mg at 08/29/13 0758  . buPROPion (WELLBUTRIN XL) 24 hr tablet 150 mg  150 mg Oral Daily Nicholaus Bloom, MD   150 mg at 08/29/13 0758  . fluticasone (FLONASE) 50 MCG/ACT nasal spray 1 spray  1  spray Each Nare Daily [name], NP      . gabapentin (NEURONTIN) capsule 300 mg  300 mg Oral TID Nicholaus Bloom, MD   300 mg at 08/29/13 1128  . hydrOXYzine (ATARAX/VISTARIL) tablet 25 mg  25 mg Oral Q6H PRN Laverle Hobby, PA-C   25 mg at 08/28/13 2142  . ibuprofen (ADVIL,MOTRIN) tablet 400 mg  400 mg Oral Q4H PRN [name], NP   400 mg at 08/28/13 2142  . lamoTRIgine (LAMICTAL) tablet 25 mg  25 mg Oral QHS Nicholaus Bloom, MD   25 mg at 08/27/13 2146  . loratadine (CLARITIN) tablet 10 mg  10 mg  Oral Daily PRN Laverle Hobby, PA-C   10 mg at 08/29/13 0352  . magnesium hydroxide (MILK OF MAGNESIA) suspension 30 mL  30 mL Oral Daily PRN Laverle Hobby, PA-C      . meloxicam (MOBIC) tablet 7.5 mg  7.5 mg Oral BID Nicholaus Bloom, MD   7.5 mg at 08/29/13 0759  . nicotine polacrilex (NICORETTE) gum 2 mg  2 mg Oral PRN Waylan Boga, NP   2 mg at 08/29/13 1129  . traZODone (DESYREL) tablet 150 mg  150 mg Oral QHS,MR X 1 [name], NP        Lab Results: No results found for this or any previous visit (from the past 48 hour(s)).  Physical Findings: AIMS: Facial and Oral Movements Muscles of Facial Expression: None, normal Lips and Perioral Area: None, normal Jaw: None, normal Tongue: None, normal,Extremity Movements Upper (arms, wrists, hands, fingers): None, normal Lower (legs, knees, ankles, toes): None, normal, Trunk Movements Neck, shoulders, hips: None, normal, Overall Severity Severity of abnormal movements (highest score from questions above): None, normal Incapacitation due to abnormal movements: None, normal Patient's awareness of abnormal movements (rate only patient's report): No Awareness, Dental Status Current problems with teeth and/or dentures?: No Does patient usually wear dentures?: No  CIWA:  CIWA-Ar Total: 1 COWS:     Treatment Plan Summary: Daily contact with patient to assess and evaluate symptoms and progress in treatment Medication management  Plan: Supportive approach/coping skills/relapse prevention  Address the congestion/sinusitis: Continue Zithromax. Add Flonase 1 drop to bilateral nares for allergies Optimize response to medications.             Medical Decision Making Problem Points:  Review of psycho-social stressors (1) Data Points:  Review of medication regiment & side effects (2) Review of new medications or change in dosage (2)  I certify that inpatient services furnished can reasonably be expected to improve the patient's condition.    [name], PHMNP-BC 08/29/2013, 2:56 PM

## 2013-08-29 NOTE — Progress Notes (Signed)
Patient ID: Kelly [name], female   DOB: [birth year], 28 y.o.   MRN: [id number] Pt resting in bed with eyes closed.  RR equal and unlabored.  Fifteen minute checks in progress for patient safety.  Pt safe on unit.

## 2013-08-30 MED ORDER — LAMOTRIGINE 25 MG PO TABS
25.0000 mg | ORAL_TABLET | Freq: Every day | ORAL | Status: DC
  Administered 2013-08-30 – 2013-08-31 (×2): 25 mg via ORAL
  Filled 2013-08-30 (×4): qty 1

## 2013-08-30 MED ORDER — MELOXICAM 7.5 MG PO TABS
7.5000 mg | ORAL_TABLET | Freq: Two times a day (BID) | ORAL | Status: DC
  Administered 2013-08-30 – 2013-09-01 (×4): 7.5 mg via ORAL
  Filled 2013-08-30 (×8): qty 1

## 2013-08-30 MED ORDER — TRAZODONE HCL 150 MG PO TABS
150.0000 mg | ORAL_TABLET | Freq: Every evening | ORAL | Status: DC | PRN
  Administered 2013-08-30 – 2013-08-31 (×2): 150 mg via ORAL
  Filled 2013-08-30 (×8): qty 1

## 2013-08-30 MED ORDER — ATOMOXETINE HCL 40 MG PO CAPS
40.0000 mg | ORAL_CAPSULE | Freq: Every day | ORAL | Status: DC
  Administered 2013-08-31 – 2013-09-01 (×2): 40 mg via ORAL
  Filled 2013-08-30: qty 1
  Filled 2013-08-30 (×3): qty 14

## 2013-08-30 MED ORDER — BUPROPION HCL ER (XL) 150 MG PO TB24
150.0000 mg | ORAL_TABLET | Freq: Every day | ORAL | Status: DC
  Administered 2013-08-31 – 2013-09-01 (×2): 150 mg via ORAL
  Filled 2013-08-30 (×4): qty 1

## 2013-08-30 MED ORDER — GABAPENTIN 300 MG PO CAPS
300.0000 mg | ORAL_CAPSULE | Freq: Three times a day (TID) | ORAL | Status: DC
  Administered 2013-08-30 – 2013-08-31 (×2): 300 mg via ORAL
  Filled 2013-08-30 (×6): qty 1

## 2013-08-30 MED ORDER — ATOMOXETINE HCL 40 MG PO CAPS
40.0000 mg | ORAL_CAPSULE | Freq: Every day | ORAL | Status: DC
  Filled 2013-08-30: qty 1

## 2013-08-30 NOTE — Progress Notes (Signed)
[facility] Group Notes:  (Nursing/MHT/Case Management/Adjunct)  Date:  08/30/2013  Time:  4:38 PM  Type of Therapy:  Therapeutic Activity  Participation Level:  Active  Participation Quality:  Appropriate, Attentive and Supportive  Affect:  Appropriate  Cognitive:  Appropriate  Insight:  Appropriate  Engagement in Group:  Engaged and Supportive  Modes of Intervention:  Activity  Summary of Progress/Problems: Pts played a game of Human Bingo and Unique Qualities.  Clint Bolder 08/30/2013, 4:38 PM

## 2013-08-30 NOTE — Discharge Summary (Signed)
Physician Discharge Summary Note  Patient:  Kelly [name] is an 28 y.o., female MRN:  [id number] DOB:  Sep 21, 1985 Patient phone:  [phone] (home)  Patient address:   [place] [address],  Total Time spent with patient: Greater than 30 minutes  Date of Admission:  08/24/2013  Date of Discharge: 09/01/13  Reason for Admission: Opioid dependence  Discharge Diagnoses: Active Problems:   Opioid dependence   Unspecified episodic mood disorder   ADHD (attention deficit hyperactivity disorder)   Cocaine abuse   Psychiatric Specialty Exam: Physical Exam  Psychiatric: Her speech is normal and behavior is normal. Judgment and thought content normal. Her mood appears not anxious. Her affect is not angry, not blunt, not labile and not inappropriate. Cognition and memory are normal. She does not exhibit a depressed mood.    Review of Systems  Constitutional: Negative.   HENT: Negative.   Eyes: Negative.   Respiratory: Negative.   Cardiovascular: Negative.   Gastrointestinal: Negative.   Genitourinary: Negative.   Musculoskeletal: Negative.   Skin: Negative.   Neurological: Negative.   Endo/Heme/Allergies: Negative.   Psychiatric/Behavioral: Positive for depression (Stabilized with medication prior to discharge) and substance abuse (Opioid dependence). Negative for suicidal ideas, hallucinations and memory loss. The patient has insomnia. The patient is not nervous/anxious.     Blood pressure 142/100, pulse 102, temperature 97.8 F (36.6 C), temperature source Oral, resp. rate 16, height 5' 7"  (1.702 m), weight 91.853 kg (202 lb 8 oz), last menstrual period 07/15/2013.Body mass index is 31.71 kg/(m^2).   General Appearance: Fairly Groomed   Engineer, water:: Fair   Speech: Clear and Coherent   Volume: Normal   Mood: Euthymic   Affect: Appropriate   Thought Process: Coherent and Goal Directed   Orientation: Full (Time, Place, and Person)   Thought Content:  relapse prevention plan   Suicidal Thoughts: No   Homicidal Thoughts: No   Memory: Immediate; Fair  Recent; Fair  Remote; Fair   Judgement: Fair   Insight: Present   Psychomotor Activity: Normal   Concentration: Fair   Recall: [employer] of Knowledge:NA   Language: Fair   Akathisia: No   Handed:   AIMS (if indicated):   Assets: Desire for Improvement  Housing  Social Support   Sleep: Number of Hours: 6.25    Past Psychiatric History: Diagnosis: Opioid dependence, Benzodiazepine dependence, Cocaine dependence  Hospitalizations: [facility]  Outpatient Care: [facility]  Substance Abuse Care: [facility]  Self-Mutilation: NA  Suicidal Attempts: NA  Violent Behaviors: NA   Musculoskeletal: Strength & Muscle Tone: within normal limits Gait & Station: normal Patient leans: N/A  DSM5: Schizophrenia Disorders:  NA Obsessive-Compulsive Disorders:  NA Trauma-Stressor Disorders:  NA Substance/Addictive Disorders:  Opioid Disorder - Severe (304.00) Depressive Disorders:  NA  Axis Diagnosis:  AXIS I:  Opioid dependence, Bendiazepine dependence, Cocaine dependence AXIS II:  Deferred AXIS III:   Past Medical History  Diagnosis Date  . Hypertension   . ADD (attention deficit disorder)   . Bipolar 1 disorder   . Polysubstance abuse   . Suicidal overdose    AXIS IV:  other psychosocial or environmental problems and Opioid dependence AXIS V:  62  Level of Care:  [facility]  Hospital Course:  28 Y/O female who states he has been using heroin IV for a year. States she uses pain pills for her back but claims he has never abused them. . States she has been on and  off cocaine, since 12. States that alcohol used to be an issue (got 5 DWI) states she now dirnks a drink a week. She states she came to the ED she was given clonidine and Ativan sent to [facility] where she was detox.  Robby was admitted to the hospital with her UDS reports positive for multiple substances  (Benzodiazepine, opiates and cocaine. She was not presenting with any withdrawal symptoms as she indicated that she had already detoxed prior to her admission to [facility]. However, Rhealynn still needed mood stabilization treatment and a referral to a substance abuse treatment center for further substance abuse treatment. She was ordered and received Starttera 40 mg daily for ADHD, Wellbutrin XL 150 mg daily for depression, Neurontin 400 mg three times daily for substance withdrawal syndrome, Hydroxyzine 25 mg three times daily prn for anxiety, Lamictal 25 mg daily for mood stabilization and Trazodone 150 mg Q bedtime for sleep. She was also enrolled in group counseling sessions, AA/NA meetings being offered on this unit, she learned coping skills. She also received medication management for her other medical issues that she presented. She tolerated her treatment regimen without any significant adverse effects and or reactions.  Soley's mood is stabilized. She is currently being discharged to continue substance abuse treatment at the [name] treatment center in [place], [place]. And for medication management and routine psychiatric care, she will receive these services at the [facility] here in [place], [place]. She has been provided with all the necessary information required to make these appointments. Upon discharge, Britnay adamantly denies any SIHI, AVH, delusional thoughts, paranoia and or withdrawal symptoms. She received 14 days worth, supply samples of her [facility] discharge medications. She left [facility] with all personal belongings in no distress. Transportation per mother.  Consults:  psychiatry  Significant Diagnostic Studies:  labs: CBC with diff, CMP, UDS, toxicology tests, U/A  Discharge Vitals:   Blood pressure 142/100, pulse 102, temperature 97.8 F (36.6 C), temperature source Oral, resp. rate 16, height 5' 7"  (1.702 m), weight 91.853 kg (202 lb 8 oz), last menstrual period  07/15/2013. Body mass index is 31.71 kg/(m^2). Lab Results:   Results for orders placed during the hospital encounter of 08/24/13 (from the past 72 hour(s))  PREGNANCY, URINE     Status: None   Collection Time    08/31/13  3:29 PM      Result Value Ref Range   Preg Test, Ur NEGATIVE  NEGATIVE   Comment:            THE SENSITIVITY OF THIS     METHODOLOGY IS >20 mIU/mL.     Performed at [facility]    Physical Findings: AIMS: Facial and Oral Movements Muscles of Facial Expression: None, normal Lips and Perioral Area: None, normal Jaw: None, normal Tongue: None, normal,Extremity Movements Upper (arms, wrists, hands, fingers): None, normal Lower (legs, knees, ankles, toes): None, normal, Trunk Movements Neck, shoulders, hips: None, normal, Overall Severity Severity of abnormal movements (highest score from questions above): None, normal Incapacitation due to abnormal movements: None, normal Patient's awareness of abnormal movements (rate only patient's report): No Awareness, Dental Status Current problems with teeth and/or dentures?: No Does patient usually wear dentures?: No  CIWA:  CIWA-Ar Total: 1 COWS:     Psychiatric Specialty Exam: See Psychiatric Specialty Exam and Suicide Risk Assessment completed by Attending Physician prior to discharge.  Discharge destination:  [facility]  Is patient on multiple antipsychotic therapies at discharge:  No  Has Patient had three or more failed trials of antipsychotic monotherapy by history:  No  Recommended Plan for Multiple Antipsychotic Therapies: NA     Medication List    STOP taking these medications       amphetamine-dextroamphetamine 10 MG tablet  Commonly known as:  ADDERALL     amphetamine-dextroamphetamine 30 MG tablet  Commonly known as:  ADDERALL     HYDROcodone-acetaminophen 5-325 MG per tablet  Commonly known as:  NORCO/VICODIN      TAKE these medications     Indication   atomoxetine 40 MG  capsule  Commonly known as:  STRATTERA  Take 1 capsule (40 mg total) by mouth daily. For ADHA   Indication:  Attention Deficit Hyperactivity Disorder     buPROPion 150 MG 24 hr tablet  Commonly known as:  WELLBUTRIN XL  Take 1 tablet (150 mg total) by mouth daily. For depression   Indication:  Major Depressive Disorder     fluticasone 50 MCG/ACT nasal spray  Commonly known as:  FLONASE  Place 1 spray into both nostrils daily. For allergies   Indication:  Perennial Rhinitis, Hayfever     gabapentin 400 MG capsule  Commonly known as:  NEURONTIN  Take 1 capsule (400 mg total) by mouth 3 (three) times daily. For substance withdrawal syndrome   Indication:  Substance withdrawal syndrome     hydrOXYzine 25 MG tablet  Commonly known as:  ATARAX/VISTARIL  Take 1 tablet (25 mg) three times daily as needed: For anxiety   Indication:  Tension, Anxiety     ibuprofen 800 MG tablet  Commonly known as:  ADVIL,MOTRIN  Take 1 tablet (800 mg total) by mouth every 6 (six) hours as needed for moderate pain (every 6 to 8 hours as needed for pain).   Indication:  Moderate pain     lamoTRIgine 25 MG tablet  Commonly known as:  LAMICTAL  Take 1 tablet (25 mg total) by mouth at bedtime. For mood stabilization   Indication:  Mood stabilization     lidocaine 5 %  Commonly known as:  LIDODERM  Place 1 patch onto the skin daily. Remove & Discard patch within 12 hours or as directed by MD: For pain   Indication:  Nerve Pain After Herpes Zoster or Shingles     loratadine 10 MG tablet  Commonly known as:  CLARITIN  Take 1 tablet (10 mg total) by mouth daily as needed for allergies. (May purchase this medicine from over the counter at yr local pharmacy): For allergies   Indication:  Perennial Rhinitis, Hayfever     meloxicam 7.5 MG tablet  Commonly known as:  MOBIC  Take 1 tablet (7.5 mg total) by mouth 2 (two) times daily. For pain management   Indication:  Pain management     traZODone 150 MG  tablet  Commonly known as:  DESYREL  Take 1 tablet (150 mg total) by mouth at bedtime and may repeat dose one time if needed. For sleep   Indication:  Trouble Sleeping       Follow-up Information   Follow up with [facility] On 09/02/2013. (Arrive at 8:00 am promptly for admission for further inpatient treatment.  Bring photo ID, insurance card, 30 day supply of all meds and belongings. )    Contact information:   [address] [place], [place] [address] Phone: [phone] Fax: [phone]      Follow up with [facility]. (Call and schedule for outpatient medication management  and group therapy after completing inpatient treatment. )    Contact information:   [address] [place], [place] [address] Phone: [phone] Fax: [phone]     Follow-up recommendations: Activity:  As tolerated Diet: As recommended by your primary care doctor. Keep all scheduled follow-up appointments as recommended.   Comments:  Take all your medications as prescribed by your mental healthcare provider. Report any adverse effects and or reactions from your medicines to your outpatient provider promptly. Patient is instructed and cautioned to not engage in alcohol and or illegal drug use while on prescription medicines. In the event of worsening symptoms, patient is instructed to call the crisis hotline, 911 and or go to the nearest ED for appropriate evaluation and treatment of symptoms. Follow-up with your primary care provider for your other medical issues, concerns and or health care needs.  Total Discharge Time:  Greater than 30 minutes.  Signed: [name], PMHNP 09/02/2013, 8:47 AM Personally evaluated the patient and agree with assessment and plan Geralyn Flash A. Zoeann Mol,M.D.

## 2013-08-30 NOTE — Progress Notes (Signed)
Pt continues to do well in the milieu though endorses anxiety. Reports she was able to visit with her child tonight and it went well. No further complaints of rash. Back pain worse tonight. Ibuprofen given at bed time for a rating of 10/10. Attending groups. Medicated with ibuprofen prn (and scheduled mobic). Vistaril given prn for anxiety. On reassess, pt is asleep. No SI/HI/AVH and remains safe. Junius Creamer Tommi Rumps

## 2013-08-30 NOTE — BHH Group Notes (Signed)
[facility] LCSW Group Therapy Note  08/30/2013 /10:00 AM  Type of Therapy and Topic:  Group Therapy: Avoiding Self-Sabotaging and Enabling Behaviors  Participation Level:  Minimal  Description of Group:     Learn how to identify obstacles, self-sabotaging and enabling behaviors, what are they, why do we do them and what needs do these behaviors meet? Discuss unhealthy relationships and how to have positive healthy boundaries with those that sabotage and enable. Explore aspects of self-sabotage and enabling in yourself and how to limit these self-destructive behaviors in everyday life.  Therapeutic Goals: 1. Patient will identify one obstacle that relates to self-sabotage and enabling behaviors 2. Patient will identify one personal self-sabotaging or enabling behavior they did prior to admission 3. Patient able to establish a plan to change the above identified behavior they did prior to admission:  4. Patient will demonstrate ability to communicate their needs through discussion and/or role plays.   Summary of Patient Progress: The main focus of today's process group was to explain what "self-sabotage" means and use Motivational Interviewing to discuss what benefits, negative or positive, were involved in a self-identified self-sabotaging behavior. We then talked about reasons the patient may want to change the behavior and current desire to change.Patient were asked to identify where they are are cycle of change chart and asked to share their motivation.  Patient shared that she is looking forward to learning to live sober and reconnecting with son this year. Soon after warm up pt left the dayroom and did not return during remainder of group time.    Therapeutic Modalities:   Cognitive Behavioral Therapy Person-Centered Therapy Motivational Interviewing   Sheilah Pigeon, LCSW

## 2013-08-30 NOTE — BHH Group Notes (Signed)
[facility] Group Notes:  (Nursing/MHT/Case Management/Adjunct)  Date:  08/30/2013  Time:  11:53 AM  Type of Therapy:  Psychoeducational Skills  Participation Level:  Active  Participation Quality:  Appropriate  Affect:  Appropriate  Cognitive:  Appropriate  Insight:  Appropriate  Engagement in Group:  Engaged  Modes of Intervention:  Discussion  Summary of Progress/Problems: Pt did attend self inventory group, pt reported that she was negative SI/HI, no AH/VH noted. Pt rated her depression as a 2, and his helplessness/hopelessness as a 0.     Zaryiah Barz Shanta Gisselle Galvis 08/30/2013, 11:53 AM

## 2013-08-30 NOTE — Progress Notes (Signed)
Above note was reviewed. Concur with above assessment and plan.  Skip Estimable, MD

## 2013-08-30 NOTE — BHH Group Notes (Signed)
[facility] Group Notes:  (Nursing/MHT/Case Management/Adjunct)  Date:  08/30/2013  Time:  2:34 PM  Type of Therapy:  Nurse Education  Participation Level:  Active  Participation Quality:  Appropriate  Affect:  Appropriate  Cognitive:  Appropriate  Insight:  Good  Engagement in Group:  Engaged  Modes of Intervention:  Discussion  Summary of Progress/Problems:   Pt attended RN group this afternoon. Pt was appropriate and participate in the educational video group.     Tretha Sciara 08/30/2013, 2:34 PM

## 2013-08-30 NOTE — Progress Notes (Addendum)
Patient ID: Kelly [name], female   DOB: 06-07-1985, 28 y.o.   MRN: [id number] Patient ID: Kelly [name], female   DOB: 06/20/1985, 28 y.o.   MRN: [id number] [facility] MD Progress Note  08/30/2013 11:49 AM LARENA OHNEMUS  MRN:  [id number]  Subjective:  Izzabelle is having a lot of of anxiety, agitation and poor focus. Would want to change her pass code to not receive phone calls from her friends. Wants to concentrate on her own problems, and ways to deal with it. She is participating in groups.  Diagnosis:   DSM5: Schizophrenia Disorders:  none Obsessive-Compulsive Disorders:  none Trauma-Stressor Disorders:  none Substance/Addictive Disorders:  Opioid Disorder - Severe (304.00), Cocaine related mood disorder Depressive Disorders:  Major Depressive Disorder - Moderate (296.22) Total Time spent with patient: 30 minutes  Axis I: ADHD, combined type and Mood Disorder NOS  ADL's:  Intact  Sleep: Poor  Appetite:  Fair  Suicidal Ideation:  Plan:  denies Intent:  denies Means:  denies Homicidal Ideation:  Plan:  denies Intent:  denies Means:  denies AEB (as evidenced by):  Psychiatric Specialty Exam: Physical Exam  Review of Systems  Constitutional: Negative.   HENT: Positive for congestion.        Sinus problems  Eyes: Negative.   Respiratory: Negative.   Cardiovascular: Negative.   Gastrointestinal: Negative.   Genitourinary: Negative.   Musculoskeletal: Negative.   Skin: Negative.   Neurological: Negative.   Endo/Heme/Allergies: Negative.   Psychiatric/Behavioral: Positive for substance abuse. The patient is nervous/anxious.     Blood pressure 112/80, pulse 116, temperature 97.7 F (36.5 C), temperature source Oral, resp. rate 16, height 5' 7"  (1.702 m), weight 91.853 kg (202 lb 8 oz), last menstrual period 07/15/2013.Body mass index is 31.71 kg/(m^2).  General Appearance: Fairly Groomed  Engineer, water::  Fair  Speech:  Clear and Coherent and congested  Volume:   Decreased  Mood:  Anxious and worried  Affect:  anxious, worried  Thought Process:  Coherent and Goal Directed  Orientation:  Full (Time, Place, and Person)  Thought Content:  symtpoms, worries, concerns  Suicidal Thoughts:  No  Homicidal Thoughts:  No  Memory:  Immediate;   Fair Recent;   Fair Remote;   Fair  Judgement:  Fair  Insight:  Present  Psychomotor Activity:  Restlessness  Concentration:  Fair  Recall:  [employer] of Knowledge:NA  Language: Fair  Akathisia:  No  Handed:    AIMS (if indicated):     Assets:  Desire for Improvement Housing Social Support  Sleep:  Number of Hours: 4.5   Musculoskeletal: Strength & Muscle Tone: within normal limits Gait & Station: normal Patient leans: N/A  Current Medications: Current Facility-Administered Medications  Medication Dose Route Frequency Provider Last Rate Last Dose  . acetaminophen (TYLENOL) tablet 650 mg  650 mg Oral Q6H PRN Laverle Hobby, PA-C   650 mg at 08/26/13 1601  . alum & mag hydroxide-simeth (MAALOX/MYLANTA) 200-200-20 MG/5ML suspension 30 mL  30 mL Oral Q4H PRN Laverle Hobby, PA-C      . [START ON 08/31/2013] atomoxetine (STRATTERA) capsule 40 mg  40 mg Oral Daily [name], NP      . buPROPion (WELLBUTRIN XL) 24 hr tablet 150 mg  150 mg Oral Daily Nicholaus Bloom, MD   150 mg at 08/30/13 0736  . fluticasone (FLONASE) 50 MCG/ACT nasal spray 1 spray  1 spray Each Nare Daily [name], NP  1 spray at 08/30/13 0617  . gabapentin (NEURONTIN) capsule 300 mg  300 mg Oral TID Nicholaus Bloom, MD   300 mg at 08/30/13 0736  . hydrOXYzine (ATARAX/VISTARIL) tablet 25 mg  25 mg Oral Q6H PRN Laverle Hobby, PA-C   25 mg at 08/28/13 2142  . ibuprofen (ADVIL,MOTRIN) tablet 400 mg  400 mg Oral Q4H PRN [name], NP   400 mg at 08/29/13 1659  . lamoTRIgine (LAMICTAL) tablet 25 mg  25 mg Oral QHS Nicholaus Bloom, MD   25 mg at 08/29/13 2218  . loratadine (CLARITIN) tablet 10 mg  10 mg Oral Daily PRN Laverle Hobby,  PA-C   10 mg at 08/30/13 8295  . magnesium hydroxide (MILK OF MAGNESIA) suspension 30 mL  30 mL Oral Daily PRN Laverle Hobby, PA-C      . meloxicam (MOBIC) tablet 7.5 mg  7.5 mg Oral BID Nicholaus Bloom, MD   7.5 mg at 08/30/13 0737  . nicotine polacrilex (NICORETTE) gum 2 mg  2 mg Oral PRN Waylan Boga, NP   2 mg at 08/30/13 6213  . traZODone (DESYREL) tablet 150 mg  150 mg Oral QHS,MR X 1 [name], NP   150 mg at 08/29/13 2218    Lab Results: No results found for this or any previous visit (from the past 48 hour(s)).  Physical Findings: AIMS: Facial and Oral Movements Muscles of Facial Expression: None, normal Lips and Perioral Area: None, normal Jaw: None, normal Tongue: None, normal,Extremity Movements Upper (arms, wrists, hands, fingers): None, normal Lower (legs, knees, ankles, toes): None, normal, Trunk Movements Neck, shoulders, hips: None, normal, Overall Severity Severity of abnormal movements (highest score from questions above): None, normal Incapacitation due to abnormal movements: None, normal Patient's awareness of abnormal movements (rate only patient's report): No Awareness, Dental Status Current problems with teeth and/or dentures?: No Does patient usually wear dentures?: No  CIWA:  CIWA-Ar Total: 1 COWS:     Treatment Plan Summary: Daily contact with patient to assess and evaluate symptoms and progress in treatment Medication management  Plan: Supportive approach/coping skills/relapse prevention  Address the congestion/sinusitis: Continue Zithromax. Continue Flonase 1 drop to bilateral nares for allergies Increased Strattera from 25 mg to 40 mg daily for ADHD Optimize response to medications.             Medical Decision Making Problem Points:  Review of psycho-social stressors (1) Data Points:  Review of medication regiment & side effects (2) Review of new medications or change in dosage (2)  I certify that inpatient services furnished can reasonably  be expected to improve the patient's condition.   [name], PHMNP-BC 08/30/2013, 11:49 AM

## 2013-08-30 NOTE — Progress Notes (Signed)
D. Pt has been up and has been active in milieu today, attending and participating in various activities. Pt reports feelings of anxiety and agitation and does appear restless and fidgety today and has requested and received PRN medication to assist with anxiety. A. Support and encouragement provided. R. Safety maintained, will continue to monitor.

## 2013-08-30 NOTE — Progress Notes (Signed)
Pt reported her energy as low and ability to pay attention as poor.  She rated both her depression and hopelessness a 2 and anxiety 5 on her self-inventory. She denies any S/H ideation or A/V/H.

## 2013-08-31 DIAGNOSIS — F411 Generalized anxiety disorder: Secondary | ICD-10-CM

## 2013-08-31 LAB — PREGNANCY, URINE: PREG TEST UR: NEGATIVE

## 2013-08-31 MED ORDER — LIDOCAINE 5 % EX PTCH
1.0000 | MEDICATED_PATCH | CUTANEOUS | Status: DC
  Administered 2013-08-31: 1 via TRANSDERMAL
  Filled 2013-08-31 (×2): qty 1
  Filled 2013-08-31: qty 14
  Filled 2013-08-31: qty 1

## 2013-08-31 MED ORDER — GABAPENTIN 400 MG PO CAPS
400.0000 mg | ORAL_CAPSULE | Freq: Three times a day (TID) | ORAL | Status: DC
  Administered 2013-08-31 – 2013-09-01 (×5): 400 mg via ORAL
  Filled 2013-08-31: qty 42
  Filled 2013-08-31 (×7): qty 1
  Filled 2013-08-31: qty 42
  Filled 2013-08-31 (×3): qty 1
  Filled 2013-08-31: qty 42

## 2013-08-31 NOTE — BHH Group Notes (Signed)
[facility] LCSW Group Therapy  08/31/2013 3:25 PM  Type of Therapy:  Group Therapy  Participation Level:  Active  Participation Quality:  Attentive and Sharing  Affect:  Appropriate  Cognitive:  Alert and Oriented  Insight:  Developing/Improving  Engagement in Therapy:  Engaged  Modes of Intervention:  Discussion, Exploration and Support  Summary of Progress/Problems:  Group topic today consisted of a discussion around obstacles, specifically fear as an obstacle.  Members were asked to process and use the acronym False, Evidence, Appearing Real as it relates to their obstacles of achieving goals, sobriety, and a future.   Candace was very invested in treatment discussing the main obstacle of her life is denial and fear of disappointing her friends and family. Candace reports that her friends have been there for her through so much and she feels she has failed them causing her to cycle back in her addiction. She was able to use insight to understand how fear plays a role in overcoming obstacles and working to engage in positive means of moving forward.  Lilly Cove 08/31/2013, 3:25 PM

## 2013-08-31 NOTE — Progress Notes (Addendum)
Patient ID: Kelly [name], female   DOB: 09-19-1985, 28 y.o.   MRN: [id number] D: patient continues to express her desire to stay clean and sober.  She states that originally she wanted to stay overnight with her mom and go to [name] the next day.  When she found out that she had to go to [facility] straight from [place] [facility], she was agreeable to stay until Wednesday.  Her main complain this morning is chronic back pain which she was given ibuprofen and scheduled mobic.  She rates her depression and hopelessness as a 2.  She denies SI/HI/AVH.  Patient is animated and bright; she showed staff and peers pictures of her 3 year old son.  She has been attending groups and participating in her treatment.  A: continue to monitor medication management and MD orders.  Safety checks completed every 15 minutes per protocol.  R: patient is receptive to staff. Update 1721: Urine pregnancy test to be completed.  Urine sample collected. Patient c/o breast tenderness and bloating.

## 2013-08-31 NOTE — BHH Group Notes (Signed)
[facility] LCSW Aftercare Discharge Planning Group Note   08/31/2013 11:24 AM  Participation Quality:  Active and Engaged  Mood/Affect:  Appropriate  Bright and Full  Depression Rating:  0  Anxiety Rating:  0  Thoughts of Suicide:  No Will you contract for safety?   Yes  Current AVH:  No  Plan for Discharge/Comments:  Patient is agreeable to long term treatment and has a bed on 4/22 to [facility].   Transportation Means: mother  Supports: family supports.  [place]

## 2013-08-31 NOTE — Progress Notes (Signed)
Patient ID: Kelly [name], female   DOB: [birth year], 28 y.o.   MRN: [id number] Pt started out in group but left shortly after it started.

## 2013-08-31 NOTE — Progress Notes (Signed)
[facility] MD Progress Note  08/31/2013 7:11 PM Kelly [name]  MRN:  [id number] Subjective:  Having a hard time with pain. She is also concerned she might be pregnant. Has had abdominal discomfort and a sensation of fulness in her breast. States this is the same way she felt when she was pregnant before. She is due to go to [facility] Wednesday AM. Would like to be considered for a D/C tomorrow to her mother's care so she can spend the night with her mother and her son Diagnosis:   DSM5: Schizophrenia Disorders:  none Obsessive-Compulsive Disorders:  none Trauma-Stressor Disorders:  none Substance/Addictive Disorders:  Opioid Disorder - Moderate (304.00), Cocaine related Disorder Depressive Disorders:  none Total Time spent with patient: 30 minutes  Axis I: ADHD, combined type, Anxiety Disorder NOS and Mood Disorder NOS  ADL's:  Impaired  Sleep: Fair  Appetite:  Fair  Suicidal Ideation:  Plan:  denies Intent:  denies Means:  denies Homicidal Ideation:  Plan:  denies Intent:  denies Means:  denies AEB (as evidenced by):  Psychiatric Specialty Exam: Physical Exam  Review of Systems  Constitutional: Negative.   HENT: Negative.   Eyes: Negative.   Respiratory: Negative.   Cardiovascular: Negative.   Gastrointestinal: Negative.   Genitourinary: Negative.   Musculoskeletal: Positive for back pain.  Skin: Negative.   Neurological: Negative.   Endo/Heme/Allergies: Negative.   Psychiatric/Behavioral: Positive for substance abuse. The patient is nervous/anxious.     Blood pressure 124/86, pulse 96, temperature 97.8 F (36.6 C), temperature source Oral, resp. rate 18, height 5' 7"  (1.702 m), weight 91.853 kg (202 lb 8 oz), last menstrual period 07/15/2013.Body mass index is 31.71 kg/(m^2).  General Appearance: Fairly Groomed  Engineer, water::  Fair  Speech:  Clear and Coherent  Volume:  Normal  Mood:  Anxious and in pain  Affect:  Appropriate  Thought Process:  Coherent and Goal  Directed  Orientation:  Full (Time, Place, and Person)  Thought Content:  symtpoms, worries, concerns  Suicidal Thoughts:  No  Homicidal Thoughts:  No  Memory:  Immediate;   Fair Recent;   Fair Remote;   Fair  Judgement:  Fair  Insight:  Present  Psychomotor Activity:  Restlessness  Concentration:  Fair  Recall:  [employer] of Knowledge:NA  Language: Fair  Akathisia:  No  Handed:    AIMS (if indicated):     Assets:  Desire for Improvement  Sleep:  Number of Hours: 6.75   Musculoskeletal: Strength & Muscle Tone: within normal limits Gait & Station: normal Patient leans: N/A  Current Medications: Current Facility-Administered Medications  Medication Dose Route Frequency Provider Last Rate Last Dose  . acetaminophen (TYLENOL) tablet 650 mg  650 mg Oral Q6H PRN Laverle Hobby, PA-C   650 mg at 08/26/13 1601  . alum & mag hydroxide-simeth (MAALOX/MYLANTA) 200-200-20 MG/5ML suspension 30 mL  30 mL Oral Q4H PRN Laverle Hobby, PA-C      . atomoxetine (STRATTERA) capsule 40 mg  40 mg Oral Daily [name], NP   40 mg at 08/31/13 0735  . buPROPion (WELLBUTRIN XL) 24 hr tablet 150 mg  150 mg Oral Daily Nicholaus Bloom, MD   150 mg at 08/31/13 0735  . fluticasone (FLONASE) 50 MCG/ACT nasal spray 1 spray  1 spray Each Nare Daily [name], NP   1 spray at 08/31/13 0617  . gabapentin (NEURONTIN) capsule 400 mg  400 mg Oral TID Nicholaus Bloom, MD  400 mg at 08/31/13 1640  . hydrOXYzine (ATARAX/VISTARIL) tablet 25 mg  25 mg Oral Q6H PRN Laverle Hobby, PA-C   25 mg at 08/31/13 1316  . ibuprofen (ADVIL,MOTRIN) tablet 400 mg  400 mg Oral Q4H PRN [name], NP   400 mg at 08/31/13 0740  . lamoTRIgine (LAMICTAL) tablet 25 mg  25 mg Oral QHS Nicholaus Bloom, MD   25 mg at 08/30/13 2109  . lidocaine (LIDODERM) 5 % 1 patch  1 patch Transdermal Q24H Nicholaus Bloom, MD   1 patch at 08/31/13 1641  . loratadine (CLARITIN) tablet 10 mg  10 mg Oral Daily PRN Laverle Hobby, PA-C   10 mg at  08/31/13 0617  . magnesium hydroxide (MILK OF MAGNESIA) suspension 30 mL  30 mL Oral Daily PRN Laverle Hobby, PA-C      . meloxicam (MOBIC) tablet 7.5 mg  7.5 mg Oral BID Nicholaus Bloom, MD   7.5 mg at 08/31/13 0923  . nicotine polacrilex (NICORETTE) gum 2 mg  2 mg Oral PRN Waylan Boga, NP   2 mg at 08/31/13 1640  . traZODone (DESYREL) tablet 150 mg  150 mg Oral QHS,MR X 1 Nicholaus Bloom, MD   150 mg at 08/30/13 2109    Lab Results: No results found for this or any previous visit (from the past 70 hour(s)).  Physical Findings: AIMS: Facial and Oral Movements Muscles of Facial Expression: None, normal Lips and Perioral Area: None, normal Jaw: None, normal Tongue: None, normal,Extremity Movements Upper (arms, wrists, hands, fingers): None, normal Lower (legs, knees, ankles, toes): None, normal, Trunk Movements Neck, shoulders, hips: None, normal, Overall Severity Severity of abnormal movements (highest score from questions above): None, normal Incapacitation due to abnormal movements: None, normal Patient's awareness of abnormal movements (rate only patient's report): No Awareness, Dental Status Current problems with teeth and/or dentures?: No Does patient usually wear dentures?: No  CIWA:  CIWA-Ar Total: 1 COWS:     Treatment Plan Summary: Daily contact with patient to assess and evaluate symptoms and progress in treatment Medication management  Plan: Supportive approach/coping skills/relapse prevention           Increase the Neurontin to 400 mg TID                                  Lidoderm patch to back  Medical Decision Making Problem Points:  Established problem, worsening (2) and Review of psycho-social stressors (1) Data Points:  Review of medication regiment & side effects (2) Review of new medications or change in dosage (2)  I certify that inpatient services furnished can reasonably be expected to improve the patient's condition.   Nicholaus Bloom 08/31/2013, 7:11  PM

## 2013-09-01 DIAGNOSIS — F1410 Cocaine abuse, uncomplicated: Secondary | ICD-10-CM

## 2013-09-01 DIAGNOSIS — F1120 Opioid dependence, uncomplicated: Secondary | ICD-10-CM

## 2013-09-01 MED ORDER — LIDOCAINE 5 % EX PTCH: 1.0000 | MEDICATED_PATCH | CUTANEOUS | Status: DC

## 2013-09-01 MED ORDER — IBUPROFEN 800 MG PO TABS: 800.0000 mg | ORAL_TABLET | Freq: Four times a day (QID) | ORAL | Status: DC | PRN

## 2013-09-01 MED ORDER — HYDROXYZINE HCL 25 MG PO TABS
25.0000 mg | ORAL_TABLET | Freq: Three times a day (TID) | ORAL | Status: DC | PRN
  Filled 2013-09-01: qty 42

## 2013-09-01 MED ORDER — LORATADINE 10 MG PO TABS: 10.0000 mg | ORAL_TABLET | Freq: Every day | ORAL | Status: DC | PRN

## 2013-09-01 MED ORDER — LAMOTRIGINE 25 MG PO TABS: 25.0000 mg | ORAL_TABLET | Freq: Every day | ORAL | Status: DC

## 2013-09-01 MED ORDER — ATOMOXETINE HCL 40 MG PO CAPS: 40.0000 mg | ORAL_CAPSULE | Freq: Every day | ORAL | Status: DC

## 2013-09-01 MED ORDER — HYDROXYZINE HCL 25 MG PO TABS: ORAL_TABLET | ORAL | Status: DC

## 2013-09-01 MED ORDER — GABAPENTIN 400 MG PO CAPS: 400.0000 mg | ORAL_CAPSULE | Freq: Three times a day (TID) | ORAL | Status: DC

## 2013-09-01 MED ORDER — BUPROPION HCL ER (XL) 150 MG PO TB24: 150.0000 mg | ORAL_TABLET | Freq: Every day | ORAL | Status: DC

## 2013-09-01 MED ORDER — FLUTICASONE PROPIONATE 50 MCG/ACT NA SUSP: 1.0000 | Freq: Every day | NASAL | Status: DC

## 2013-09-01 MED ORDER — MELOXICAM 7.5 MG PO TABS: 7.5000 mg | ORAL_TABLET | Freq: Two times a day (BID) | ORAL | Status: DC

## 2013-09-01 MED ORDER — TRAZODONE HCL 150 MG PO TABS: 150.0000 mg | ORAL_TABLET | Freq: Every evening | ORAL | Status: DC | PRN

## 2013-09-01 NOTE — Progress Notes (Signed)
Recreation Therapy Notes  nimal-Assisted Activity/Therapy (AAA/T) Program Checklist/Progress Notes Patient Eligibility Criteria Checklist & Daily Group note for Rec Tx Intervention  Date: 04.21.2015 Time: 2:45pm Location: [address]    AAA/T Program Assumption of Risk Form signed by Patient/ or Parent Legal Guardian yes  Patient is free of allergies or sever asthma yes  Patient reports no fear of animals yes  Patient reports no history of cruelty to animals yes   Patient understands his/her participation is voluntary yes  Behavioral Response: Did not attend.   Laureen Ochs Dnasia Gauna, LRT/CTRS        Lane Hacker 09/01/2013 4:29 PM

## 2013-09-01 NOTE — Progress Notes (Signed)
[facility] Adult Case Management Discharge Plan :  Will you be returning to the same living situation after discharge: Yes,  pt can return home after completing treatment At discharge, do you have transportation home?:Yes,  mom will pick pt up Do you have the ability to pay for your medications:Yes,  provided pt with prescriptions and pt verbalizes ability to afford meds  Release of information consent forms completed and in the chart;  Patient's signature needed at discharge.  Patient to Follow up at: Follow-up Information   Follow up with [facility] On 09/02/2013. (Arrive at 8:00 am promptly for admission for further inpatient treatment.  Bring photo ID, insurance card, 30 day supply of all meds and belongings. )    Contact information:   [address] [place], [place] [address] Phone: [phone] Fax: [phone]      Follow up with [facility]. (Call and schedule for outpatient medication management and group therapy after completing inpatient treatment. )    Contact information:   [address] [place], [place] [address] Phone: [phone] Fax: [phone]      Patient denies SI/HI:   Yes,  denies SI/HI    Safety Planning and Suicide Prevention discussed:  Yes,  discussed with pt and pt's mother.  See suicide prevention education note.   Kelly [name] N Horton 09/01/2013, 8:55 AM

## 2013-09-01 NOTE — Progress Notes (Signed)
Patient ID: Kelly [name], female   DOB: 1985-11-25, 28 y.o.   MRN: [id number]  D: Pt was laying in bed during the start of group and environmentals.  Pt decided to go to group after several mins. Pt informed the writer that the plans are still in place for her discharge to [facility] on Wednesday. Stated she wanted to get a bed sooner, but is willing to wait until Wednesday. Stated, "I'm not strong enough to go home first".  A:  Support and encouragement was offered. 15 min checks continued for safety.  R: Pt remains safe.

## 2013-09-01 NOTE — Progress Notes (Signed)
Adult Psychoeducational Group Note  Date:  09/01/2013 Time:  10:10 AM  Group Topic/Focus:  Recovery Goals:   The focus of this group is to identify appropriate goals for recovery and establish a plan to achieve them.  Participation Level:  Active  Participation Quality:  Appropriate, Attentive, Sharing and Supportive  Affect:  Appropriate  Cognitive:  Alert, Appropriate and Oriented  Insight: Appropriate and Good  Engagement in Group:  Engaged and Supportive  Modes of Intervention:  Discussion, Education, Socialization and Support  Additional Comments:  Pt attended and participated in group.  Milus Glazier 09/01/2013, 10:10 AM

## 2013-09-01 NOTE — BHH Suicide Risk Assessment (Signed)
Suicide Risk Assessment  Discharge Assessment     Demographic Factors:  Caucasian  Total Time spent with patient: 45 minutes  Psychiatric Specialty Exam:     Blood pressure 142/100, pulse 102, temperature 97.8 F (36.6 C), temperature source Oral, resp. rate 16, height 5' 7"  (1.702 m), weight 91.853 kg (202 lb 8 oz), last menstrual period 07/15/2013.Body mass index is 31.71 kg/(m^2).  General Appearance: Fairly Groomed  Engineer, water::  Fair  Speech:  Clear and Coherent  Volume:  Normal  Mood:  Euthymic  Affect:  Appropriate  Thought Process:  Coherent and Goal Directed  Orientation:  Full (Time, Place, and Person)  Thought Content:  relapse prevention plan  Suicidal Thoughts:  No  Homicidal Thoughts:  No  Memory:  Immediate;   Fair Recent;   Fair Remote;   Fair  Judgement:  Fair  Insight:  Present  Psychomotor Activity:  Normal  Concentration:  Fair  Recall:  [employer] of Knowledge:NA  Language: Fair  Akathisia:  No  Handed:    AIMS (if indicated):     Assets:  Desire for Improvement Housing Social Support  Sleep:  Number of Hours: 6.25    Musculoskeletal: Strength & Muscle Tone: within normal limits Gait & Station: normal Patient leans: N/A   Mental Status Per Nursing Assessment::   On Admission:  NA  Current Mental Status by Physician: In full contact with reality. There are no active S/S of withdrawal. She is committed to abstinence. Will be staying with her mother and her son tonight and her mother will take her to [facility] in the morning.    Loss Factors: Decline in physical health  Historical Factors: NA  Risk Reduction Factors:   Responsible for children under 44 years of age, Sense of responsibility to family, Living with another person, especially a relative and Positive social support  Continued Clinical Symptoms:  Alcohol/Substance Abuse/Dependencies  Cognitive Features That Contribute To Risk:  Closed-mindedness Polarized  thinking Thought constriction (tunnel vision)    Suicide Risk:  Minimal: No identifiable suicidal ideation.  Patients presenting with no risk factors but with morbid ruminations; may be classified as minimal risk based on the severity of the depressive symptoms  Discharge Diagnoses:   AXIS I:  Opioid Dependence, Cocaine Abuse, ADHD, Mood Disorder NOS AXIS II:  Deferred AXIS III:   Past Medical History  Diagnosis Date  . Hypertension   . ADD (attention deficit disorder)   . Bipolar 1 disorder   . Polysubstance abuse   . Suicidal overdose    AXIS IV:  other psychosocial or environmental problems AXIS V:  61-70 mild symptoms  Plan Of Care/Follow-up recommendations:  Activity:  as tolerated Diet:  regular Follow up [facility] Is patient on multiple antipsychotic therapies at discharge:  No   Has Patient had three or more failed trials of antipsychotic monotherapy by history:  No  Recommended Plan for Multiple Antipsychotic Therapies: NA    Nicholaus Bloom 09/01/2013, 11:30 AM

## 2013-09-01 NOTE — Progress Notes (Signed)
Patient ID: Kelly [name], female   DOB: June 06, 1985, 28 y.o.   MRN: [id number] Nursing discharge note:  Patient discharged home per MD order.  Patient received all personal belongings, prescriptions and medication samples.  Discharge instructions reviewed along with medications/indications.  Patient indicated understanding of same.  She is to go home with her mother to see her 74 yo son.  Patient is then to go straight to [facility] in the morning.  She received a 30 day supply of medication to take with her.  Patient denies any SI/HI/AVH.  She left ambulatory with her mother.

## 2013-09-01 NOTE — BHH Group Notes (Addendum)
[facility] LCSW Group Therapy  09/01/2013 2:02 PM  Type of Therapy:  Group Therapy  Participation Level:  Active  Participation Quality:  Attentive but drowsy  Affect:  Appropriate  Cognitive:  Alert and Oriented  Insight:  Developing/Improving  Engagement in Therapy:  Engaged  Modes of Intervention:  Discussion, Education and Exploration  Summary of Progress/Problems:  Group today consisted of Mental Health Association speaker discussing his personal story related to recovery with substance abuse and mental health.  Sherrica attended the session, listened attentively and engaged with speaker.    Kelly [name] 09/01/2013, 2:02 PM

## 2013-09-04 NOTE — Progress Notes (Signed)
Patient Discharge Instructions:  After Visit Summary (AVS):   Faxed to:  09/04/13 Discharge Summary Note:   Faxed to:  09/04/13 Psychiatric Admission Assessment Note:   Faxed to:  09/04/13 Suicide Risk Assessment - Discharge Assessment:   Faxed to:  09/04/13 Faxed/Sent to the Next Level Care provider:  09/04/13 Faxed to [facility] @ [phone] Faxed to [facility] @ [facility], 09/04/2013, 1:55 PM

## 2013-09-27 ENCOUNTER — Emergency Department (HOSPITAL_BASED_OUTPATIENT_CLINIC_OR_DEPARTMENT_OTHER)
Admission: EM | Admit: 2013-09-27 | Discharge: 2013-09-27 | Disposition: A | Discharge status: Home / Self Care | Payer: Medicaid Other | Attending: Emergency Medicine | Admitting: Emergency Medicine

## 2013-09-27 ENCOUNTER — Encounter (HOSPITAL_BASED_OUTPATIENT_CLINIC_OR_DEPARTMENT_OTHER): Payer: Self-pay | Admitting: Emergency Medicine

## 2013-09-27 DIAGNOSIS — Z79899 Other long term (current) drug therapy: Secondary | ICD-10-CM | POA: Insufficient documentation

## 2013-09-27 DIAGNOSIS — M779 Enthesopathy, unspecified: Secondary | ICD-10-CM

## 2013-09-27 DIAGNOSIS — Z791 Long term (current) use of non-steroidal anti-inflammatories (NSAID): Secondary | ICD-10-CM | POA: Insufficient documentation

## 2013-09-27 DIAGNOSIS — I10 Essential (primary) hypertension: Secondary | ICD-10-CM | POA: Insufficient documentation

## 2013-09-27 DIAGNOSIS — M659 Synovitis and tenosynovitis, unspecified: Secondary | ICD-10-CM | POA: Insufficient documentation

## 2013-09-27 DIAGNOSIS — M65979 Unspecified synovitis and tenosynovitis, unspecified ankle and foot: Secondary | ICD-10-CM | POA: Insufficient documentation

## 2013-09-27 DIAGNOSIS — F319 Bipolar disorder, unspecified: Secondary | ICD-10-CM | POA: Insufficient documentation

## 2013-09-27 DIAGNOSIS — F17200 Nicotine dependence, unspecified, uncomplicated: Secondary | ICD-10-CM | POA: Insufficient documentation

## 2013-09-27 DIAGNOSIS — F988 Other specified behavioral and emotional disorders with onset usually occurring in childhood and adolescence: Secondary | ICD-10-CM | POA: Insufficient documentation

## 2013-09-27 MED ORDER — PREDNISONE 10 MG PO TABS: ORAL_TABLET | ORAL | Status: DC

## 2013-09-27 NOTE — ED Notes (Addendum)
Pt c/o right heel pain after exercising almost 2 weeks ago. States she thought she had injured her achilles tendon- pain then started in left heel- now has pain and swelling in both feet, swelling worse at night. Pt in detox from etoh and cocaine at [facility]- last use 30 days ago

## 2013-09-27 NOTE — Discharge Instructions (Signed)
Achilles Tendinitis Achilles tendinitis is inflammation of the tough, cord-like band that attaches the lower muscles of your leg to your heel (Achilles tendon). It is usually caused by overusing the tendon and joint involved.  CAUSES Achilles tendinitis can happen because of:  A sudden increase in exercise or activity (such as running).  Doing the same exercises or activities (such as jumping) over and over.  Not warming up calf muscles before exercising.  Exercising in shoes that are worn out or not made for exercise.  Having arthritis or a bone growth on the back of the heel bone. This can rub against the tendon and hurt the tendon. SIGNS AND SYMPTOMS The most common symptoms are:  Pain in the back of the leg, just above the heel. The pain usually gets worse with exercise and better with rest.  Stiffness or soreness in the back of the leg, especially in the morning.  Swelling of the skin over the Achilles tendon.  Trouble standing on tiptoe. Sometimes, an Achilles tendon tears (ruptures). Symptoms of an Achilles tendon rupture can include:  Sudden, severe pain in the back of the leg.  Trouble putting weight on the foot or walking normally. DIAGNOSIS Achilles tendinitis will be diagnosed based on symptoms and a physical examination. An X-ray may be done to check if another condition is causing your symptoms. An MRI may be ordered if your health care provider suspects you may have completely torn your tendon, which is called an Achilles tendon rupture.  TREATMENT  Achilles tendinitis usually gets better over time. It can take weeks to months to heal completely. Treatment focuses on treating the symptoms and helping the injury heal. HOME CARE INSTRUCTIONS   Rest your Achilles tendon and avoid activities that cause pain.  Apply ice to the injured area:  Put ice in a plastic bag.  Place a towel between your skin and the bag.  Leave the ice on for 20 minutes, 2 3 times a  day  Try to avoid using the tendon (other than gentle range of motion) while the tendon is painful. Do not resume use until instructed by your health care provider. Then begin use gradually. Do not increase use to the point of pain. If pain does develop, decrease use and continue the above measures. Gradually increase activities that do not cause discomfort until you achieve normal use.  Do exercises to make your calf muscles stronger and more flexible. Your health care provider or physical therapist can recommend exercises for you to do.  Wrap your ankle with an elastic bandage or other wrap. This can help keep your tendon from moving too much. Your health care provider will show you how to wrap your ankle correctly.  Only take over-the-counter or prescription medicines for pain, discomfort, or fever as directed by your health care provider. SEEK MEDICAL CARE IF:   Your pain and swelling increase or pain is uncontrolled with medicines.  You develop new, unexplained symptoms or your symptoms get worse.  You are unable to move your toes or foot.  You develop warmth and swelling in your foot.  You have an unexplained temperature. MAKE SURE YOU:   Understand these instructions.  Will watch your condition.  Will get help right away if you are not doing well or get worse. Document Released: 02/07/2005 Document Revised: 02/18/2013 Document Reviewed: 12/10/2012 [name] Patient Information 2014 [place].

## 2013-09-27 NOTE — ED Provider Notes (Signed)
CSN: [id number]     Arrival date & time 09/27/13  1515 History   First MD Initiated Contact with Patient 09/27/13 1603     Chief Complaint  Patient presents with  . Foot Pain     (Consider location/radiation/quality/duration/timing/severity/associated sxs/prior Treatment) Patient is a 28 y.o. female presenting with ankle pain. The history is provided by the patient.  Ankle Pain Associated symptoms: no fever   Associated symptoms comment:  Pain that started in the right posterior ankle 2 weeks ago after running for the first time in a long time. Several days later, similar pain started in the left posterior ankle. She has noticed swelling of the lateral heels without discoloration. She has not been running since the initial discomfort. No other pain.    Past Medical History  Diagnosis Date  . Hypertension   . ADD (attention deficit disorder)   . Bipolar 1 disorder   . Polysubstance abuse   . Suicidal overdose    Past Surgical History  Procedure Laterality Date  . Cesarean section    . Back surgery      scoliosis  . Cholecystectomy  March 2015   Family History  Problem Relation Age of Onset  . Diabetes Mother   . Diabetes Father   . Hypertension Father   . Heart attack Neg Hx   . Hyperlipidemia Neg Hx   . Sudden death Neg Hx    History  Substance Use Topics  . Smoking status: Current Every Day Smoker -- 1.00 packs/day for 13 years    Types: Cigarettes  . Smokeless tobacco: Never Used  . Alcohol Use: Yes     Comment: one or two drinks weekly   OB History   Grav Para Term Preterm Abortions TAB SAB Ect Mult Living                 Review of Systems  Constitutional: Negative for fever and chills.  Respiratory: Negative.   Cardiovascular: Negative.   Musculoskeletal:       See HPI.  Skin: Negative.       Allergies  Hydrocodone and Morphine and related  Home Medications   Prior to Admission medications   Medication Sig Start Date End Date Taking?  Authorizing Provider  buPROPion (WELLBUTRIN XL) 150 MG 24 hr tablet Take 1 tablet (150 mg total) by mouth daily. For depression 09/01/13  Yes [name], NP  fluticasone (FLONASE) 50 MCG/ACT nasal spray Place 1 spray into both nostrils daily. For allergies 09/01/13  Yes [name], NP  gabapentin (NEURONTIN) 400 MG capsule Take 1 capsule (400 mg total) by mouth 3 (three) times daily. For substance withdrawal syndrome 09/01/13  Yes [name], NP  ibuprofen (ADVIL,MOTRIN) 800 MG tablet Take 1 tablet (800 mg total) by mouth every 6 (six) hours as needed for moderate pain (every 6 to 8 hours as needed for pain). 09/01/13  Yes [name], NP  loratadine (CLARITIN) 10 MG tablet Take 1 tablet (10 mg total) by mouth daily as needed for allergies. (May purchase this medicine from over the counter at yr local pharmacy): For allergies 09/01/13  Yes [name], NP  lurasidone (LATUDA) 40 MG TABS tablet Take 40 mg by mouth daily with breakfast.   Yes Historical Provider, MD  atomoxetine (STRATTERA) 40 MG capsule Take 1 capsule (40 mg total) by mouth daily. For ADHA 09/01/13   [name], NP  hydrOXYzine (ATARAX/VISTARIL) 25 MG tablet Take 1 tablet (25 mg)  three times daily as needed: For anxiety 09/01/13   [name], NP  lamoTRIgine (LAMICTAL) 25 MG tablet Take 1 tablet (25 mg total) by mouth at bedtime. For mood stabilization 09/01/13   [name], NP  lidocaine (LIDODERM) 5 % Place 1 patch onto the skin daily. Remove & Discard patch within 12 hours or as directed by MD: For pain 09/01/13   [name], NP  meloxicam (MOBIC) 7.5 MG tablet Take 1 tablet (7.5 mg total) by mouth 2 (two) times daily. For pain management 09/01/13   [name], NP  traZODone (DESYREL) 150 MG tablet Take 1 tablet (150 mg total) by mouth at bedtime and may repeat dose one time if needed. For sleep 09/01/13   [name], NP   BP 130/84  Pulse 88  Temp(Src) 98.9 F (37.2 C) (Oral)  Resp 20  SpO2 100%  LMP  09/06/2013 Physical Exam  Constitutional: She is oriented to person, place, and time. She appears well-developed and well-nourished. No distress.  Musculoskeletal:  Tender Achilles tendons bilaterally with minimal swelling of lateral and medial heels. Fully weight bearing. FROM. No palpable tendon rupture. Ankle joints stable. No calf pain.  Neurological: She is alert and oriented to person, place, and time.  Skin: Skin is warm and dry. No erythema.    ED Course  Procedures (including critical care time) Labs Review Labs Reviewed - No data to display  Imaging Review No results found.   EKG Interpretation None      MDM   Final diagnoses:  None    1. Tendonitis  Findings c/w tendonitis of Achilles without rupture. ASO given for support. Ibuprofen has not offered relief. Will try taper dose of prednisone. Patient is in drug treatment program and does not want narcotic pain relievers.     Dewaine Oats, PA-C 09/27/13 1708

## 2013-09-29 NOTE — ED Provider Notes (Signed)
Medical screening examination/treatment/procedure(s) were performed by non-physician practitioner and as supervising physician I was immediately available for consultation/collaboration.   EKG Interpretation None       Babette Relic, MD 09/29/13 0030

## 2013-10-01 ENCOUNTER — Encounter (HOSPITAL_COMMUNITY): Payer: Self-pay | Admitting: Emergency Medicine

## 2013-10-01 ENCOUNTER — Emergency Department (HOSPITAL_COMMUNITY): Payer: Medicaid Other

## 2013-10-01 ENCOUNTER — Emergency Department (HOSPITAL_COMMUNITY)
Admission: EM | Admit: 2013-10-01 | Discharge: 2013-10-01 | Disposition: A | Discharge status: Home / Self Care | Payer: Medicaid Other | Attending: Emergency Medicine | Admitting: Emergency Medicine

## 2013-10-01 DIAGNOSIS — Z79899 Other long term (current) drug therapy: Secondary | ICD-10-CM | POA: Insufficient documentation

## 2013-10-01 DIAGNOSIS — Z791 Long term (current) use of non-steroidal anti-inflammatories (NSAID): Secondary | ICD-10-CM | POA: Insufficient documentation

## 2013-10-01 DIAGNOSIS — IMO0002 Reserved for concepts with insufficient information to code with codable children: Secondary | ICD-10-CM | POA: Insufficient documentation

## 2013-10-01 DIAGNOSIS — M7660 Achilles tendinitis, unspecified leg: Secondary | ICD-10-CM | POA: Insufficient documentation

## 2013-10-01 DIAGNOSIS — Z3202 Encounter for pregnancy test, result negative: Secondary | ICD-10-CM | POA: Insufficient documentation

## 2013-10-01 DIAGNOSIS — N390 Urinary tract infection, site not specified: Secondary | ICD-10-CM

## 2013-10-01 DIAGNOSIS — F17200 Nicotine dependence, unspecified, uncomplicated: Secondary | ICD-10-CM | POA: Insufficient documentation

## 2013-10-01 DIAGNOSIS — F319 Bipolar disorder, unspecified: Secondary | ICD-10-CM | POA: Insufficient documentation

## 2013-10-01 DIAGNOSIS — I10 Essential (primary) hypertension: Secondary | ICD-10-CM | POA: Insufficient documentation

## 2013-10-01 DIAGNOSIS — F988 Other specified behavioral and emotional disorders with onset usually occurring in childhood and adolescence: Secondary | ICD-10-CM | POA: Insufficient documentation

## 2013-10-01 LAB — URINALYSIS, ROUTINE W REFLEX MICROSCOPIC
Glucose, UA: NEGATIVE mg/dL
Ketones, ur: 15 mg/dL — AB
NITRITE: NEGATIVE
Protein, ur: 100 mg/dL — AB
SPECIFIC GRAVITY, URINE: 1.038 — AB (ref 1.005–1.030)
UROBILINOGEN UA: 1 mg/dL (ref 0.0–1.0)
pH: 5 (ref 5.0–8.0)

## 2013-10-01 LAB — URINE MICROSCOPIC-ADD ON

## 2013-10-01 LAB — POC URINE PREG, ED: PREG TEST UR: NEGATIVE

## 2013-10-01 MED ORDER — PHENAZOPYRIDINE HCL 200 MG PO TABS: 200.0000 mg | ORAL_TABLET | Freq: Three times a day (TID) | ORAL | Status: DC | PRN

## 2013-10-01 MED ORDER — IBUPROFEN 800 MG PO TABS
800.0000 mg | ORAL_TABLET | Freq: Once | ORAL | Status: AC
  Administered 2013-10-01: 800 mg via ORAL
  Filled 2013-10-01: qty 1

## 2013-10-01 MED ORDER — CEPHALEXIN 500 MG PO CAPS: 500.0000 mg | ORAL_CAPSULE | Freq: Four times a day (QID) | ORAL | Status: DC

## 2013-10-01 NOTE — Discharge Instructions (Signed)
Achilles Tendinitis Achilles tendinitis is inflammation of the tough, cord-like band that attaches the lower muscles of your leg to your heel (Achilles tendon). It is usually caused by overusing the tendon and joint involved.  CAUSES Achilles tendinitis can happen because of:  A sudden increase in exercise or activity (such as running).  Doing the same exercises or activities (such as jumping) over and over.  Not warming up calf muscles before exercising.  Exercising in shoes that are worn out or not made for exercise.  Having arthritis or a bone growth on the back of the heel bone. This can rub against the tendon and hurt the tendon. SIGNS AND SYMPTOMS The most common symptoms are:  Pain in the back of the leg, just above the heel. The pain usually gets worse with exercise and better with rest.  Stiffness or soreness in the back of the leg, especially in the morning.  Swelling of the skin over the Achilles tendon.  Trouble standing on tiptoe. Sometimes, an Achilles tendon tears (ruptures). Symptoms of an Achilles tendon rupture can include:  Sudden, severe pain in the back of the leg.  Trouble putting weight on the foot or walking normally. DIAGNOSIS Achilles tendinitis will be diagnosed based on symptoms and a physical examination. An X-ray may be done to check if another condition is causing your symptoms. An MRI may be ordered if your health care provider suspects you may have completely torn your tendon, which is called an Achilles tendon rupture.  TREATMENT  Achilles tendinitis usually gets better over time. It can take weeks to months to heal completely. Treatment focuses on treating the symptoms and helping the injury heal. HOME CARE INSTRUCTIONS   Rest your Achilles tendon and avoid activities that cause pain.  Apply ice to the injured area:  Put ice in a plastic bag.  Place a towel between your skin and the bag.  Leave the ice on for 20 minutes, 2 3 times a  day  Try to avoid using the tendon (other than gentle range of motion) while the tendon is painful. Do not resume use until instructed by your health care provider. Then begin use gradually. Do not increase use to the point of pain. If pain does develop, decrease use and continue the above measures. Gradually increase activities that do not cause discomfort until you achieve normal use.  Do exercises to make your calf muscles stronger and more flexible. Your health care provider or physical therapist can recommend exercises for you to do.  Wrap your ankle with an elastic bandage or other wrap. This can help keep your tendon from moving too much. Your health care provider will show you how to wrap your ankle correctly.  Only take over-the-counter or prescription medicines for pain, discomfort, or fever as directed by your health care provider. SEEK MEDICAL CARE IF:   Your pain and swelling increase or pain is uncontrolled with medicines.  You develop new, unexplained symptoms or your symptoms get worse.  You are unable to move your toes or foot.  You develop warmth and swelling in your foot.  You have an unexplained temperature. MAKE SURE YOU:   Understand these instructions.  Will watch your condition.  Will get help right away if you are not doing well or get worse. Document Released: 02/07/2005 Document Revised: 02/18/2013 Document Reviewed: 12/10/2012 [name] Patient Information 2014 [place].  Urinary Tract Infection Urinary tract infections (UTIs) can develop anywhere along your urinary tract. Your urinary tract is  your body's drainage system for removing wastes and extra water. Your urinary tract includes two kidneys, two ureters, a bladder, and a urethra. Your kidneys are a pair of bean-shaped organs. Each kidney is about the size of your fist. They are located below your ribs, one on each side of your spine. CAUSES Infections are caused by microbes, which are  microscopic organisms, including fungi, viruses, and bacteria. These organisms are so small that they can only be seen through a microscope. Bacteria are the microbes that most commonly cause UTIs. SYMPTOMS  Symptoms of UTIs may vary by age and gender of the patient and by the location of the infection. Symptoms in young women typically include a frequent and intense urge to urinate and a painful, burning feeling in the bladder or urethra during urination. Older women and men are more likely to be tired, shaky, and weak and have muscle aches and abdominal pain. A fever may mean the infection is in your kidneys. Other symptoms of a kidney infection include pain in your back or sides below the ribs, nausea, and vomiting. DIAGNOSIS To diagnose a UTI, your caregiver will ask you about your symptoms. Your caregiver also will ask to provide a urine sample. The urine sample will be tested for bacteria and white blood cells. White blood cells are made by your body to help fight infection. TREATMENT  Typically, UTIs can be treated with medication. Because most UTIs are caused by a bacterial infection, they usually can be treated with the use of antibiotics. The choice of antibiotic and length of treatment depend on your symptoms and the type of bacteria causing your infection. HOME CARE INSTRUCTIONS  If you were prescribed antibiotics, take them exactly as your caregiver instructs you. Finish the medication even if you feel better after you have only taken some of the medication.  Drink enough water and fluids to keep your urine clear or pale yellow.  Avoid caffeine, tea, and carbonated beverages. They tend to irritate your bladder.  Empty your bladder often. Avoid holding urine for long periods of time.  Empty your bladder before and after sexual intercourse.  After a bowel movement, women should cleanse from front to back. Use each tissue only once. SEEK MEDICAL CARE IF:   You have back pain.  You  develop a fever.  Your symptoms do not begin to resolve within 3 days. SEEK IMMEDIATE MEDICAL CARE IF:   You have severe back pain or lower abdominal pain.  You develop chills.  You have nausea or vomiting.  You have continued burning or discomfort with urination. MAKE SURE YOU:   Understand these instructions.  Will watch your condition.  Will get help right away if you are not doing well or get worse. Document Released: 02/07/2005 Document Revised: 10/30/2011 Document Reviewed: 06/08/2011 [facility] Patient Information 2014 [place].

## 2013-10-01 NOTE — ED Notes (Signed)
Pt c/o blood in urine tract infection symptoms that started yesterday evening. Pt stated that this morning urine was straight blood. Hx of UTIs in the past and stated that she gets them all the time. Also c/o bilateral ankle swelling that started 2-21/2 weeks ago and was started on Prednisone which has helped some with the swelling but pain continues to increase.

## 2013-10-01 NOTE — ED Provider Notes (Signed)
CSN: [id number]     Arrival date & time 10/01/13  0725 History   First MD Initiated Contact with Patient 10/01/13 0730     Chief Complaint  Patient presents with  . Urinary Tract Infection  . Leg Swelling     (Consider location/radiation/quality/duration/timing/severity/associated sxs/prior Treatment) HPI Comments: Patient presents to the ER for evaluation of multiple complaints. Patient reports that she has developed urinary frequency, urinary dysuria, hematuria, urgency, last one or 2 days. She reports a history of recurrent UTIs in the past with similar symptoms. She has not had fever, nausea, vomiting. No back pain.  Patient also complains of persistent ankle, heel and foot pain. She has been seen 3 days ago for this in the ER. She was diagnosed with tendinitis and started on prednisone. She reports that some of the swelling has improved, but the pain is still present. She reports that the pain is worse when she tries to flex her foot upwards. She reports that the pain started after she started exercising and walking, but no direct trauma.   Past Medical History  Diagnosis Date  . Hypertension   . ADD (attention deficit disorder)   . Bipolar 1 disorder   . Polysubstance abuse   . Suicidal overdose    Past Surgical History  Procedure Laterality Date  . Cesarean section    . Back surgery      scoliosis  . Cholecystectomy  March 2015   Family History  Problem Relation Age of Onset  . Diabetes Mother   . Diabetes Father   . Hypertension Father   . Heart attack Neg Hx   . Hyperlipidemia Neg Hx   . Sudden death Neg Hx    History  Substance Use Topics  . Smoking status: Current Every Day Smoker -- 1.00 packs/day for 13 years    Types: Cigarettes  . Smokeless tobacco: Never Used  . Alcohol Use: Yes     Comment: one or two drinks weekly   OB History   Grav Para Term Preterm Abortions TAB SAB Ect Mult Living                 Review of Systems  Constitutional: Negative  for fever.  Genitourinary: Positive for dysuria, urgency, frequency and hematuria.  Musculoskeletal: Positive for arthralgias.  All other systems reviewed and are negative.     Allergies  Hydrocodone and Morphine and related  Home Medications   Prior to Admission medications   Medication Sig Start Date End Date Taking? Authorizing Provider  atomoxetine (STRATTERA) 40 MG capsule Take 1 capsule (40 mg total) by mouth daily. For ADHA 09/01/13   [name], NP  buPROPion (WELLBUTRIN XL) 150 MG 24 hr tablet Take 1 tablet (150 mg total) by mouth daily. For depression 09/01/13   [name], NP  fluticasone (FLONASE) 50 MCG/ACT nasal spray Place 1 spray into both nostrils daily. For allergies 09/01/13   [name], NP  gabapentin (NEURONTIN) 400 MG capsule Take 1 capsule (400 mg total) by mouth 3 (three) times daily. For substance withdrawal syndrome 09/01/13   [name], NP  hydrOXYzine (ATARAX/VISTARIL) 25 MG tablet Take 1 tablet (25 mg) three times daily as needed: For anxiety 09/01/13   [name], NP  ibuprofen (ADVIL,MOTRIN) 800 MG tablet Take 1 tablet (800 mg total) by mouth every 6 (six) hours as needed for moderate pain (every 6 to 8 hours as needed for pain). 09/01/13   [name], NP  lamoTRIgine (LAMICTAL) 25 MG tablet Take 1 tablet (25 mg total) by mouth at bedtime. For mood stabilization 09/01/13   [name], NP  lidocaine (LIDODERM) 5 % Place 1 patch onto the skin daily. Remove & Discard patch within 12 hours or as directed by MD: For pain 09/01/13   [name], NP  loratadine (CLARITIN) 10 MG tablet Take 1 tablet (10 mg total) by mouth daily as needed for allergies. (May purchase this medicine from over the counter at yr local pharmacy): For allergies 09/01/13   [name], NP  lurasidone (LATUDA) 40 MG TABS tablet Take 40 mg by mouth daily with breakfast.    Historical Provider, MD  meloxicam (MOBIC) 7.5 MG tablet Take 1 tablet (7.5 mg total) by mouth 2 (two)  times daily. For pain management 09/01/13   [name], NP  predniSONE (DELTASONE) 10 MG tablet Take 6 tabs day 1 Take 5 tabs day 2 Take 4 tabs day 3 Take 3 tabs day 4 Take 2 tabs day 5 Take 1 tab day 6 09/27/13   Nehemiah Settle A Upstill, PA-C  traZODone (DESYREL) 150 MG tablet Take 1 tablet (150 mg total) by mouth at bedtime and may repeat dose one time if needed. For sleep 09/01/13   [name], NP   BP 136/97  Pulse 87  Temp(Src) 97.7 F (36.5 C) (Oral)  Resp 20  SpO2 98%  LMP 09/06/2013 Physical Exam  Constitutional: She is oriented to person, place, and time. She appears well-developed and well-nourished. No distress.  HENT:  Head: Normocephalic and atraumatic.  Right Ear: Hearing normal.  Left Ear: Hearing normal.  Nose: Nose normal.  Mouth/Throat: Oropharynx is clear and moist and mucous membranes are normal.  Eyes: Conjunctivae and EOM are normal. Pupils are equal, round, and reactive to light.  Neck: Normal range of motion. Neck supple.  Cardiovascular: Regular rhythm, S1 normal and S2 normal.  Exam reveals no gallop and no friction rub.   No murmur heard. Pulmonary/Chest: Effort normal and breath sounds normal. No respiratory distress. She exhibits no tenderness.  Abdominal: Soft. Normal appearance and bowel sounds are normal. There is no hepatosplenomegaly. There is no tenderness. There is no rebound, no guarding, no tenderness at McBurney's point and negative Murphy's sign. No hernia.  Musculoskeletal: Normal range of motion.       Right ankle: She exhibits swelling. She exhibits no ecchymosis and no deformity. Achilles tendon exhibits pain. Achilles tendon exhibits normal Thompson's test results (normal plantar flextion with test).       Left ankle: She exhibits swelling. She exhibits no ecchymosis and no deformity. Tenderness. Achilles tendon exhibits pain. Achilles tendon exhibits normal Thompson's test results (normal plantar flexion with test).        Feet:  Neurological: She is alert and oriented to person, place, and time. She has normal strength. No cranial nerve deficit or sensory deficit. Coordination normal. GCS eye subscore is 4. GCS verbal subscore is 5. GCS motor subscore is 6.  Skin: Skin is warm, dry and intact. No rash noted. No cyanosis.  Psychiatric: She has a normal mood and affect. Her speech is normal and behavior is normal. Thought content normal.    ED Course  Procedures (including critical care time) Labs Review Labs Reviewed  URINALYSIS, ROUTINE W REFLEX MICROSCOPIC - Abnormal; Notable for the following:    Color, Urine AMBER (*)    APPearance CLOUDY (*)    Specific Gravity, Urine 1.038 (*)  Hgb urine dipstick LARGE (*)    Bilirubin Urine SMALL (*)    Ketones, ur 15 (*)    Protein, ur 100 (*)    Leukocytes, UA TRACE (*)    All other components within normal limits  URINE MICROSCOPIC-ADD ON - Abnormal; Notable for the following:    Crystals CA OXALATE CRYSTALS (*)    All other components within normal limits  POC URINE PREG, ED    Imaging Review Dg Ankle Complete Left  10/01/2013   CLINICAL DATA:  28 year old female with pain and swelling. Initial encounter.  EXAM: LEFT ANKLE COMPLETE - 3+ VIEW  COMPARISON:  None.  FINDINGS: Bone mineralization is within normal limits. No joint effusion. Calcaneus intact. Mortise joint alignment preserved. Talar dome intact. No acute fracture identified. Suggestion of generalized soft tissue swelling about the ankle.  IMPRESSION: No acute osseous abnormality identified at the left ankle.   Electronically Signed   By: Lars Pinks M.D.   On: 10/01/2013 08:16   Dg Ankle Complete Right  10/01/2013   CLINICAL DATA:  28 year old female with pain and swelling. Initial encounter.  EXAM: RIGHT ANKLE - COMPLETE 3+ VIEW  COMPARISON:  Left ankle series from the same day reported separately.  FINDINGS: No joint effusion. Bone mineralization is within normal limits. Calcaneus intact. Mortise  joint alignment preserved. Talar dome intact. No acute fracture or dislocation. Suggestion of generalized soft tissue swelling about the ankle.  IMPRESSION: No acute osseous abnormality identified at the right ankle.   Electronically Signed   By: Lars Pinks M.D.   On: 10/01/2013 08:17     EKG Interpretation None      MDM   Final diagnoses:  Achilles tendinitis  UTI (lower urinary tract infection)    Presents to ER with complaints of bilateral heel and ankle pain. She has been seen previously for this. Pain is maximal in the posterior calcaneal region. No obvious deformities. No overlying skin changes to suggest infection. There is no joint swelling, redness or warmth, no concern for septic joint at the ankle.  Patient has normal plantar flexion with [place] Silos test, no concern for Achilles rupture. 2 suspected to return diagnosis of Achilles tendinitis is accurate, she does have significant pain with active and passive dorsiflexion of the foot. X-rays obtained today are unremarkable. Patient reassured, continue rest, anti-inflammatory medication.  Urinalysis, the patient states that she has had similar symptoms with urinary tract infection in the past. Will treat empirically with Keflex, Pyridium. Return for worsening symptoms including fever, vomiting,  Pain. 2 numerous to count red cells, but no specific flank pain. Calcium oxalate crystals are present, but symptoms do not reflect renal colic at this time.  Orpah Greek, MD 10/01/13 [phone]

## 2014-07-07 ENCOUNTER — Other Ambulatory Visit: Payer: Self-pay | Admitting: Family Medicine

## 2014-07-07 DIAGNOSIS — N6310 Unspecified lump in the right breast, unspecified quadrant: Secondary | ICD-10-CM

## 2014-07-12 ENCOUNTER — Other Ambulatory Visit: Payer: Self-pay

## 2014-07-13 ENCOUNTER — Inpatient Hospital Stay (HOSPITAL_COMMUNITY): Payer: Medicaid Other | Admitting: Certified Registered"

## 2014-07-13 ENCOUNTER — Inpatient Hospital Stay (HOSPITAL_COMMUNITY)
Admission: EM | Admit: 2014-07-13 | Discharge: 2014-07-15 | DRG: 584 | Disposition: A | Discharge status: Home / Self Care | Payer: Medicaid Other | Attending: General Surgery | Admitting: General Surgery

## 2014-07-13 ENCOUNTER — Encounter (HOSPITAL_COMMUNITY): Payer: Self-pay | Admitting: Physical Medicine and Rehabilitation

## 2014-07-13 ENCOUNTER — Encounter (HOSPITAL_COMMUNITY): Admission: EM | Disposition: A | Discharge status: Home / Self Care | Payer: Self-pay | Source: Home / Self Care

## 2014-07-13 DIAGNOSIS — F319 Bipolar disorder, unspecified: Secondary | ICD-10-CM | POA: Diagnosis present

## 2014-07-13 DIAGNOSIS — F1120 Opioid dependence, uncomplicated: Secondary | ICD-10-CM | POA: Diagnosis present

## 2014-07-13 DIAGNOSIS — R634 Abnormal weight loss: Secondary | ICD-10-CM | POA: Diagnosis present

## 2014-07-13 DIAGNOSIS — G8918 Other acute postprocedural pain: Secondary | ICD-10-CM | POA: Diagnosis not present

## 2014-07-13 DIAGNOSIS — I10 Essential (primary) hypertension: Secondary | ICD-10-CM | POA: Diagnosis present

## 2014-07-13 DIAGNOSIS — L0390 Cellulitis, unspecified: Secondary | ICD-10-CM | POA: Diagnosis present

## 2014-07-13 DIAGNOSIS — N61 Inflammatory disorders of breast: Secondary | ICD-10-CM | POA: Diagnosis present

## 2014-07-13 DIAGNOSIS — N611 Abscess of the breast and nipple: Secondary | ICD-10-CM

## 2014-07-13 DIAGNOSIS — F17210 Nicotine dependence, cigarettes, uncomplicated: Secondary | ICD-10-CM | POA: Diagnosis present

## 2014-07-13 DIAGNOSIS — Z515 Encounter for palliative care: Secondary | ICD-10-CM | POA: Insufficient documentation

## 2014-07-13 DIAGNOSIS — N644 Mastodynia: Secondary | ICD-10-CM | POA: Diagnosis present

## 2014-07-13 HISTORY — PX: IRRIGATION AND DEBRIDEMENT ABSCESS: SHX5252

## 2014-07-13 HISTORY — DX: Abscess of the breast and nipple: N61.1

## 2014-07-13 LAB — BASIC METABOLIC PANEL
Anion gap: 9 (ref 5–15)
BUN: 8 mg/dL (ref 6–23)
CALCIUM: 9.7 mg/dL (ref 8.4–10.5)
CO2: 26 mmol/L (ref 19–32)
Chloride: 102 mmol/L (ref 96–112)
Creatinine, Ser: 0.76 mg/dL (ref 0.50–1.10)
GFR calc Af Amer: >90 mL/min (ref >90)
GFR calc non Af Amer: >90 mL/min (ref >90)
GLUCOSE: 103 mg/dL — AB (ref 70–99)
Potassium: 3.7 mmol/L (ref 3.5–5.1)
SODIUM: 137 mmol/L (ref 135–145)

## 2014-07-13 LAB — CBC WITH DIFFERENTIAL/PLATELET
Basophils Absolute: 0 10*3/uL (ref 0.0–0.1)
Basophils Relative: 0 % (ref 0–1)
EOS PCT: 1 % (ref 0–5)
Eosinophils Absolute: 0.1 10*3/uL (ref 0.0–0.7)
HCT: 44.3 % (ref 36.0–46.0)
HEMOGLOBIN: 15 g/dL (ref 12.0–15.0)
LYMPHS ABS: 1.6 10*3/uL (ref 0.7–4.0)
Lymphocytes Relative: 11 % — ABNORMAL LOW (ref 12–46)
MCH: 33.3 pg (ref 26.0–34.0)
MCHC: 33.9 g/dL (ref 30.0–36.0)
MCV: 98.4 fL (ref 78.0–100.0)
MONOS PCT: 6 % (ref 3–12)
Monocytes Absolute: 0.9 10*3/uL (ref 0.1–1.0)
Neutro Abs: 11.9 10*3/uL — ABNORMAL HIGH (ref 1.7–7.7)
Neutrophils Relative %: 82 % — ABNORMAL HIGH (ref 43–77)
Platelets: 432 10*3/uL — ABNORMAL HIGH (ref 150–400)
RBC: 4.5 MIL/uL (ref 3.87–5.11)
RDW: 12.6 % (ref 11.5–15.5)
WBC: 14.4 10*3/uL — ABNORMAL HIGH (ref 4.0–10.5)

## 2014-07-13 LAB — I-STAT CG4 LACTIC ACID, ED: Lactic Acid, Venous: 0.93 mmol/L (ref 0.5–2.0)

## 2014-07-13 SURGERY — IRRIGATION AND DEBRIDEMENT ABSCESS
Anesthesia: General | Site: Breast | Laterality: Right

## 2014-07-13 MED ORDER — CLINDAMYCIN PHOSPHATE 600 MG/50ML IV SOLN
600.0000 mg | Freq: Three times a day (TID) | INTRAVENOUS | Status: DC
  Administered 2014-07-13 – 2014-07-15 (×6): 600 mg via INTRAVENOUS
  Filled 2014-07-13 (×9): qty 50

## 2014-07-13 MED ORDER — HYDROMORPHONE HCL 1 MG/ML IJ SOLN
INTRAMUSCULAR | Status: AC
  Filled 2014-07-13: qty 2

## 2014-07-13 MED ORDER — PROMETHAZINE HCL 25 MG/ML IJ SOLN: 6.2500 mg | INTRAMUSCULAR | Status: DC | PRN

## 2014-07-13 MED ORDER — LACTATED RINGERS IV SOLN
INTRAVENOUS | Status: DC
  Administered 2014-07-13 – 2014-07-14 (×3): via INTRAVENOUS

## 2014-07-13 MED ORDER — 0.9 % SODIUM CHLORIDE (POUR BTL) OPTIME
TOPICAL | Status: DC | PRN
  Administered 2014-07-13: 1000 mL

## 2014-07-13 MED ORDER — MIDAZOLAM HCL 2 MG/2ML IJ SOLN
INTRAMUSCULAR | Status: AC
  Filled 2014-07-13: qty 2

## 2014-07-13 MED ORDER — ONDANSETRON HCL 4 MG/2ML IJ SOLN
4.0000 mg | Freq: Once | INTRAMUSCULAR | Status: AC
  Administered 2014-07-13: 4 mg via INTRAVENOUS
  Filled 2014-07-13: qty 2

## 2014-07-13 MED ORDER — PROPOFOL 10 MG/ML IV BOLUS
INTRAVENOUS | Status: DC | PRN
  Administered 2014-07-13: 200 mg via INTRAVENOUS

## 2014-07-13 MED ORDER — LIDOCAINE HCL (CARDIAC) 20 MG/ML IV SOLN
INTRAVENOUS | Status: DC | PRN
  Administered 2014-07-13: 80 mg via INTRAVENOUS

## 2014-07-13 MED ORDER — NICOTINE 21 MG/24HR TD PT24
21.0000 mg | MEDICATED_PATCH | Freq: Every day | TRANSDERMAL | Status: DC
  Administered 2014-07-13 – 2014-07-15 (×3): 21 mg via TRANSDERMAL
  Filled 2014-07-13 (×3): qty 1

## 2014-07-13 MED ORDER — FENTANYL CITRATE 0.05 MG/ML IJ SOLN
INTRAMUSCULAR | Status: DC | PRN
  Administered 2014-07-13 (×2): 100 ug via INTRAVENOUS
  Administered 2014-07-13: 50 ug via INTRAVENOUS

## 2014-07-13 MED ORDER — FENTANYL CITRATE 0.05 MG/ML IJ SOLN
INTRAMUSCULAR | Status: AC
  Administered 2014-07-13: 100 ug via INTRAVENOUS
  Filled 2014-07-13: qty 2

## 2014-07-13 MED ORDER — PROPOFOL 10 MG/ML IV BOLUS
INTRAVENOUS | Status: AC
  Filled 2014-07-13: qty 20

## 2014-07-13 MED ORDER — HYDROMORPHONE HCL 1 MG/ML IJ SOLN
1.0000 mg | Freq: Once | INTRAMUSCULAR | Status: AC
  Administered 2014-07-13: 1 mg via INTRAVENOUS
  Filled 2014-07-13: qty 1

## 2014-07-13 MED ORDER — MIDAZOLAM HCL 5 MG/5ML IJ SOLN
INTRAMUSCULAR | Status: DC | PRN
  Administered 2014-07-13: 2 mg via INTRAVENOUS

## 2014-07-13 MED ORDER — FENTANYL CITRATE 0.05 MG/ML IJ SOLN
INTRAMUSCULAR | Status: AC
  Filled 2014-07-13: qty 5

## 2014-07-13 MED ORDER — HYDROMORPHONE HCL 1 MG/ML IJ SOLN
1.0000 mg | INTRAMUSCULAR | Status: DC | PRN
  Administered 2014-07-13 – 2014-07-14 (×4): 1 mg via INTRAVENOUS
  Filled 2014-07-13 (×5): qty 1

## 2014-07-13 MED ORDER — HYDROMORPHONE HCL 1 MG/ML IJ SOLN
0.2500 mg | INTRAMUSCULAR | Status: DC | PRN
  Administered 2014-07-13 (×4): 0.5 mg via INTRAVENOUS

## 2014-07-13 MED ORDER — METHADONE HCL 10 MG PO TABS
30.0000 mg | ORAL_TABLET | Freq: Every day | ORAL | Status: DC
  Administered 2014-07-14 – 2014-07-15 (×2): 30 mg via ORAL
  Filled 2014-07-13 (×2): qty 3

## 2014-07-13 MED ORDER — FENTANYL CITRATE 0.05 MG/ML IJ SOLN
100.0000 ug | Freq: Once | INTRAMUSCULAR | Status: AC
  Administered 2014-07-13: 100 ug via INTRAVENOUS

## 2014-07-13 MED ORDER — ONDANSETRON HCL 4 MG/2ML IJ SOLN
INTRAMUSCULAR | Status: DC | PRN
Start: 2014-07-13 — End: 2014-07-13
  Administered 2014-07-13: 4 mg via INTRAVENOUS

## 2014-07-13 MED ORDER — ONDANSETRON HCL 4 MG/2ML IJ SOLN: 4.0000 mg | Freq: Four times a day (QID) | INTRAMUSCULAR | Status: DC | PRN

## 2014-07-13 MED ORDER — WHITE PETROLATUM GEL
Status: AC
  Administered 2014-07-13: 17:00:00
  Filled 2014-07-13: qty 1

## 2014-07-13 MED ORDER — ENOXAPARIN SODIUM 40 MG/0.4ML ~~LOC~~ SOLN
40.0000 mg | SUBCUTANEOUS | Status: DC
  Administered 2014-07-14: 40 mg via SUBCUTANEOUS
  Filled 2014-07-13: qty 0.4

## 2014-07-13 MED ORDER — ACETAMINOPHEN 325 MG PO TABS: 650.0000 mg | ORAL_TABLET | Freq: Four times a day (QID) | ORAL | Status: DC | PRN

## 2014-07-13 MED ORDER — ACETAMINOPHEN 650 MG RE SUPP: 650.0000 mg | Freq: Four times a day (QID) | RECTAL | Status: DC | PRN

## 2014-07-13 SURGICAL SUPPLY — 35 items
BLADE SURG ROTATE 9660 (MISCELLANEOUS) IMPLANT
CANISTER SUCTION 2500CC (MISCELLANEOUS) ×3 IMPLANT
COVER SURGICAL LIGHT HANDLE (MISCELLANEOUS) ×3 IMPLANT
DRAPE LAPAROTOMY TRNSV 102X78 (DRAPE) ×3 IMPLANT
DRAPE UTILITY XL STRL (DRAPES) ×3 IMPLANT
ELECT CAUTERY BLADE 6.4 (BLADE) ×3 IMPLANT
ELECT REM PT RETURN 9FT ADLT (ELECTROSURGICAL) ×3
ELECTRODE REM PT RTRN 9FT ADLT (ELECTROSURGICAL) ×1 IMPLANT
GAUZE SPONGE 4X4 12PLY STRL (GAUZE/BANDAGES/DRESSINGS) ×3 IMPLANT
GLOVE BIO SURGEON STRL SZ7 (GLOVE) ×3 IMPLANT
GLOVE BIOGEL PI IND STRL 6.5 (GLOVE) ×1 IMPLANT
GLOVE BIOGEL PI IND STRL 7.5 (GLOVE) ×1 IMPLANT
GLOVE BIOGEL PI INDICATOR 6.5 (GLOVE) ×2
GLOVE BIOGEL PI INDICATOR 7.5 (GLOVE) ×2
GOWN STRL REUS W/ TWL LRG LVL3 (GOWN DISPOSABLE) ×2 IMPLANT
GOWN STRL REUS W/TWL LRG LVL3 (GOWN DISPOSABLE) ×4
KIT BASIN OR (CUSTOM PROCEDURE TRAY) ×3 IMPLANT
KIT ROOM TURNOVER OR (KITS) ×3 IMPLANT
NS IRRIG 1000ML POUR BTL (IV SOLUTION) ×3 IMPLANT
PACK SURGICAL SETUP 50X90 (CUSTOM PROCEDURE TRAY) ×3 IMPLANT
PAD ABD 8X10 STRL (GAUZE/BANDAGES/DRESSINGS) ×3 IMPLANT
PAD ARMBOARD 7.5X6 YLW CONV (MISCELLANEOUS) ×6 IMPLANT
PENCIL BUTTON HOLSTER BLD 10FT (ELECTRODE) ×3 IMPLANT
SPONGE LAP 18X18 X RAY DECT (DISPOSABLE) ×3 IMPLANT
SWAB COLLECTION DEVICE MRSA (MISCELLANEOUS) ×3 IMPLANT
SWAB CULTURE LIQUID MINI MALE (MISCELLANEOUS) ×3 IMPLANT
SYR BULB IRRIGATION 50ML (SYRINGE) IMPLANT
TAPE CLOTH SURG 4X10 WHT LF (GAUZE/BANDAGES/DRESSINGS) ×3 IMPLANT
TOWEL OR 17X24 6PK STRL BLUE (TOWEL DISPOSABLE) ×3 IMPLANT
TOWEL OR 17X26 10 PK STRL BLUE (TOWEL DISPOSABLE) ×3 IMPLANT
TUBE ANAEROBIC SPECIMEN COL (MISCELLANEOUS) ×3 IMPLANT
TUBE CONNECTING 12'X1/4 (SUCTIONS) ×1
TUBE CONNECTING 12X1/4 (SUCTIONS) ×2 IMPLANT
WATER STERILE IRR 1000ML POUR (IV SOLUTION) ×3 IMPLANT
YANKAUER SUCT BULB TIP NO VENT (SUCTIONS) ×3 IMPLANT

## 2014-07-13 NOTE — Anesthesia Procedure Notes (Signed)
Procedure Name: LMA Insertion Date/Time: 07/13/2014 8:24 PM Performed by: Manuela Schwartz B Pre-anesthesia Checklist: Patient identified, Emergency Drugs available, Suction available, Patient being monitored and Timeout performed Patient Re-evaluated:Patient Re-evaluated prior to inductionOxygen Delivery Method: Circle system utilized Preoxygenation: Pre-oxygenation with 100% oxygen Intubation Type: IV induction LMA: LMA inserted LMA Size: 4.0 Number of attempts: 1 Placement Confirmation: positive ETCO2 and breath sounds checked- equal and bilateral Tube secured with: Tape Dental Injury: Teeth and Oropharynx as per pre-operative assessment

## 2014-07-13 NOTE — ED Provider Notes (Signed)
CSN: [id number]     Arrival date & time 07/13/14  1031 History   First MD Initiated Contact with Patient 07/13/14 1043     Chief Complaint  Patient presents with  . Breast Pain  . Abscess     (Consider location/radiation/quality/duration/timing/severity/associated sxs/prior Treatment) HPI Comments: Patient presents to the emergency department with chief complaint of right breast abscess. She states that she noticed a small bump which has increased in size and in pain for the past week and a half. She reports some yellow/green discharge from the area. She states the pain is 10 out of 10. It is worsened with palpation. She reports some associated nausea and vomiting. She denies any associated fevers or chills. She has not tried taking anything to alleviate her symptoms. She has no history of breast abscess, but did have mastitis when she was breast-feeding. She is no longer breast-feeding.  The history is provided by the patient. No language interpreter was used.    Past Medical History  Diagnosis Date  . Hypertension   . ADD (attention deficit disorder)   . Bipolar 1 disorder   . Polysubstance abuse   . Suicidal overdose    Past Surgical History  Procedure Laterality Date  . Cesarean section    . Back surgery      scoliosis  . Cholecystectomy  March 2015   Family History  Problem Relation Age of Onset  . Diabetes Mother   . Diabetes Father   . Hypertension Father   . Heart attack Neg Hx   . Hyperlipidemia Neg Hx   . Sudden death Neg Hx    History  Substance Use Topics  . Smoking status: Current Every Day Smoker -- 1.00 packs/day for 13 years    Types: Cigarettes  . Smokeless tobacco: Never Used  . Alcohol Use: Yes     Comment: one or two drinks weekly   OB History    No data available     Review of Systems  Constitutional: Negative for fever and chills.  Respiratory: Negative for shortness of breath.   Cardiovascular: Negative for chest pain.  Gastrointestinal:  Negative for nausea, vomiting, diarrhea and constipation.  Genitourinary: Negative for dysuria.  Skin: Positive for color change, rash and wound.  All other systems reviewed and are negative.     Allergies  Hydrocodone and Morphine and related  Home Medications   Prior to Admission medications   Medication Sig Start Date End Date Taking? Authorizing Provider  buPROPion (WELLBUTRIN XL) 300 MG 24 hr tablet Take 300 mg by mouth daily.    Historical Provider, MD  cephALEXin (KEFLEX) 500 MG capsule Take 1 capsule (500 mg total) by mouth 4 (four) times daily. 10/01/13   Orpah Greek, MD  fluticasone (FLONASE) 50 MCG/ACT nasal spray Place 1 spray into both nostrils daily. For allergies 09/01/13   [name], NP  gabapentin (NEURONTIN) 400 MG capsule Take 1 capsule (400 mg total) by mouth 3 (three) times daily. For substance withdrawal syndrome 09/01/13   [name], NP  hydrOXYzine (ATARAX/VISTARIL) 25 MG tablet Take 25 mg by mouth 3 (three) times daily as needed for anxiety.    Historical Provider, MD  ibuprofen (ADVIL,MOTRIN) 800 MG tablet Take 800 mg by mouth every 6 (six) hours as needed for moderate pain.    Historical Provider, MD  loratadine (CLARITIN) 10 MG tablet Take 1 tablet (10 mg total) by mouth daily as needed for allergies. (May purchase this medicine from over  the counter at yr local pharmacy): For allergies 09/01/13   [name], NP  lurasidone (LATUDA) 40 MG TABS tablet Take 40 mg by mouth daily with breakfast.    Historical Provider, MD  phenazopyridine (PYRIDIUM) 200 MG tablet Take 1 tablet (200 mg total) by mouth 3 (three) times daily as needed for pain. 10/01/13   Orpah Greek, MD  predniSONE (STERAPRED UNI-PAK) 10 MG tablet Take 10-60 tablets by mouth daily. Take 6 tablets on Day 1, 5 tablets on Day 2, 4 tablets on Day 3, 3 tablets on Day 4, 2 tablets on Day 5, and 1 tablet on Day 6.    Historical Provider, MD  traZODone (DESYREL) 150 MG tablet Take  150-300 mg by mouth at bedtime as needed for sleep.    Historical Provider, MD   BP 115/82 mmHg  Pulse 81  Temp(Src) 98.6 F (37 C) (Oral)  Resp 18  SpO2 99% Physical Exam  Constitutional: She is oriented to person, place, and time. She appears well-developed and well-nourished.  HENT:  Head: Normocephalic and atraumatic.  Eyes: Conjunctivae and EOM are normal. Pupils are equal, round, and reactive to light.  Neck: Normal range of motion. Neck supple.  Cardiovascular: Normal rate and regular rhythm.  Exam reveals no gallop and no friction rub.   No murmur heard. Pulmonary/Chest: Effort normal and breath sounds normal. No respiratory distress. She has no wheezes. She has no rales. She exhibits no tenderness.  Abdominal: Soft. Bowel sounds are normal. She exhibits no distension and no mass. There is no tenderness. There is no rebound and no guarding.  Musculoskeletal: Normal range of motion. She exhibits no edema or tenderness.  Neurological: She is alert and oriented to person, place, and time.  Skin: Skin is warm and dry.  Large right-sided breast abscess as pictured below, and the more central region is raised approximately the size of a golf ball, with surrounding erythema  Chaperone present for exam  Psychiatric: She has a normal mood and affect. Her behavior is normal. Judgment and thought content normal.  Nursing note and vitals reviewed.   ED Course  Procedures (including critical care time) Labs Review Labs Reviewed - No data to display  Imaging Review No results found.   EKG Interpretation None        MDM   Final diagnoses:  Abscess of right breast    Patient with large right-sided breast abscess. Moderate leukocytosis to 14. No fevers or chills. The area has been draining some purulent discharge. Discussed with Dr. Darl Householder, who agrees with plan to consult general surgery.  12:23 PM Appreciate general surgery, who will evaluate the patient.  Patient will be  admitted by general surgery.    Montine Circle, PA-C 07/13/14 [place] Yao, MD 07/13/14 [phone]

## 2014-07-13 NOTE — Anesthesia Preprocedure Evaluation (Signed)
Anesthesia Evaluation    Airway Mallampati: I       Dental  (+) Teeth Intact   Pulmonary Current Smoker,  breath sounds clear to auscultation        Cardiovascular hypertension, Rhythm:Regular Rate:Normal     Neuro/Psych    GI/Hepatic (+)     substance abuse  , Hepatitis -  Endo/Other    Renal/GU      Musculoskeletal   Abdominal   Peds  Hematology   Anesthesia Other Findings   Reproductive/Obstetrics                             Anesthesia Physical Anesthesia Plan  ASA: II  Anesthesia Plan: General   Post-op Pain Management:    Induction: Intravenous  Airway Management Planned: LMA and Oral ETT  Additional Equipment:   Intra-op Plan:   Post-operative Plan: Extubation in OR  Informed Consent: I have reviewed the patients History and Physical, chart, labs and discussed the procedure including the risks, benefits and alternatives for the proposed anesthesia with the patient or authorized representative who has indicated his/her understanding and acceptance.     Plan Discussed with:   Anesthesia Plan Comments:         Anesthesia Quick Evaluation

## 2014-07-13 NOTE — ED Notes (Signed)
Attempted report 

## 2014-07-13 NOTE — H&P (Signed)
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  Duration of symptoms is 3 weeks. Onset was sudden.  Coarse is worsening.  Initially began with redness and a small "knot." she was seen by a Dr. Rex Kras and referred for an [place] which she went for today and was referred to the ED.  Symptoms worsened over the last 3-4 days.  She denies fever or chills.  Severe pain.  She vomited due to severity of pain.  Modifying factors include warm compresses.  associated with malaise.  She denies recent trauma to the affected area.  She reports greenish "crusty" drainage as of today.  She denies nipple inversion or nipple discharge.  No aggravating or alleviating factors.  She is a 1ppd smoker.  She denies drug use in 4 months. She has been NPO since last night.  Her white count shows an elevated white count at 14.4k.  No fevers, hypotension or tachycardia.    Past Medical History  Diagnosis Date  . Hypertension   . ADD (attention deficit disorder)   . Bipolar 1 disorder   . Polysubstance abuse   . Suicidal overdose     Past Surgical History  Procedure Laterality Date  . Cesarean section    . Back surgery      scoliosis  . Cholecystectomy  March 2015    Family History  Problem Relation Age of Onset  . Diabetes Mother   . Diabetes Father   . Hypertension Father   . Heart attack Neg Hx   . Hyperlipidemia Neg Hx   . Sudden death Neg Hx    Social History:  reports that she has been smoking Cigarettes.  She has a 13 pack-year smoking history. She has never used smokeless tobacco. She reports that she drinks alcohol. She reports that she uses illicit drugs (Cocaine and Heroin).  Allergies:  Allergies  Allergen Reactions  . Hydrocodone Itching  . Morphine And Related Nausea And Vomiting   Medication: Methadone 21m daily   (Not in  a hospital admission)  Results for orders placed or performed during the hospital encounter of 07/13/14 (from the past 48 hour(s))  CBC with Differential/Platelet     Status: Abnormal   Collection Time: 07/13/14 11:12 AM  Result Value Ref Range   WBC 14.4 (H) 4.0 - 10.5 K/uL   RBC 4.50 3.87 - 5.11 MIL/uL   Hemoglobin 15.0 12.0 - 15.0 g/dL   HCT 44.3 36.0 - 46.0 %   MCV 98.4 78.0 - 100.0 fL   MCH 33.3 26.0 - 34.0 pg   MCHC 33.9 30.0 - 36.0 g/dL   RDW 12.6 11.5 - 15.5 %   Platelets 432 (H) 150 - 400 K/uL   Neutrophils Relative % 82 (H) 43 - 77 %   Neutro Abs 11.9 (H) 1.7 - 7.7 K/uL   Lymphocytes Relative 11 (L) 12 - 46 %   Lymphs Abs 1.6 0.7 - 4.0 K/uL   Monocytes Relative 6 3 - 12 %   Monocytes Absolute 0.9 0.1 - 1.0 K/uL   Eosinophils Relative 1 0 - 5 %   Eosinophils Absolute 0.1 0.0 - 0.7 K/uL   Basophils Relative 0 0 - 1 %   Basophils Absolute 0.0 0.0 - 0.1 K/uL  Basic metabolic panel     Status: Abnormal   Collection Time: 07/13/14 11:12 AM  Result Value Ref Range   Sodium 137 135 - 145 mmol/L   Potassium 3.7 3.5 - 5.1 mmol/L   Chloride 102 96 - 112 mmol/L   CO2 26 19 - 32 mmol/L   Glucose, Bld 103 (H) 70 - 99 mg/dL   BUN 8 6 - 23 mg/dL   Creatinine, Ser 0.76 0.50 - 1.10 mg/dL   Calcium 9.7 8.4 - 10.5 mg/dL   GFR calc non Af Amer >90 >90 mL/min   GFR calc Af Amer >90 >90 mL/min    Comment: (NOTE) The eGFR has been calculated using the CKD EPI equation. This calculation has not been validated in all clinical situations. eGFR's persistently <90 mL/min signify possible Chronic Kidney Disease.    Anion gap 9 5 - 15  I-Stat CG4 Lactic Acid, ED     Status: None   Collection Time: 07/13/14 11:24 AM  Result Value Ref Range   Lactic Acid, Venous 0.93 0.5 - 2.0 mmol/L   No results found.  Review of Systems  All other systems reviewed and are negative.   Blood pressure 120/76, pulse 62, temperature 98.6 F (37 C), temperature source Oral, resp. rate 18, SpO2 95  %. Physical Exam  Constitutional: She appears well-developed and well-nourished. No distress.  Cardiovascular: Normal rate, regular rhythm, normal heart sounds and intact distal pulses.  Exam reveals no gallop and no friction rub.   No murmur heard. Respiratory: Effort normal and breath sounds normal. No respiratory distress. She has no wheezes. She has no rales. She exhibits no tenderness.  GI: Soft. Bowel sounds are normal. She exhibits no distension and no mass. There is no tenderness. There is no rebound and no guarding.  Musculoskeletal: She exhibits no edema or tenderness.  Skin: Skin is warm and dry. She is not diaphoretic.  Right breast-4x4cm abscess, erythema medial extending down to mammary fold 12-15cm---cellulitis   Psychiatric: She has a normal mood and affect. Her behavior is normal. Judgment and thought content normal.     Assessment/Plan Leukocytosis Right breast abscess and cellulitis  -admit -To OR this evening -clindamycin -will obtain cultures in the OR -NPO -IVF -pain control, will keep her on methadone dose to prevent withdrawal -SCD/lovenox Thrombocytosis -likely reactive, will follow Hx drug use -seen in methadone clinic.  Will need to touch base with clinic at discharge regarding pain meds   Lyonel Morejon, [place] ANP-BC 07/13/2014, 12:24 PM

## 2014-07-13 NOTE — Transfer of Care (Signed)
Immediate Anesthesia Transfer of Care Note  Patient: Kelly [name]  Procedure(s) Performed: Procedure(s): IRRIGATION AND DEBRIDEMEN TRIGHT BREAST ABSCESS (Right)  Patient Location: PACU  Anesthesia Type:General  Level of Consciousness: awake, alert  and oriented  Airway & Oxygen Therapy: Patient Spontanous Breathing  Post-op Assessment: Report given to RN and Post -op Vital signs reviewed and stable  Post vital signs: Reviewed and stable  Last Vitals:  Filed Vitals:   07/13/14 1359  BP: 113/68  Pulse: 62  Temp: 36.9 C  Resp: 18    Complications: No apparent anesthesia complications

## 2014-07-13 NOTE — Progress Notes (Signed)
Patient will be delayed. Patient will be transported back to [facility] for care until. Report called to Remo Lipps, [place]

## 2014-07-13 NOTE — Progress Notes (Signed)
[facility] [phone] Open 5-10am Pt goes daily Will call in AM

## 2014-07-13 NOTE — Op Note (Signed)
Pre-op Diagnosis:  Right breast abscess Post-op Diagnosis:  Same Procedure:  Incision and drainage of right breast abscess - sharp excision of 6 cm2 of skin and subcutaneous tissue with cautery Surgeon:  Rayder Sullenger K. Anesthesia:  GEN - LMA Indications:  29 yo female with 3 week history of worsening infection in right breast - presented today to ED for admission and treatment.  Description of procedure:  The patient was brought to the operating room and placed in a supine position on the operating table.  After an adequate level of anesthesia was obtained, the patient's right breast was prepped with Betadine and draped in sterile fashion.  There is a 4 cm area of protruding abscess in the right upper inner quadrant with a 3 cm area of necrotic skin in the center.  We incised this area after a time-out, and encountered a large amount of purulent material.  This was cultured and sent for microbiology.  We irrigated the abscess thoroughly.  There does not seem to be any tunneling.  The abscess is about 3 cm in diameter and about 5 cm deep.  The wound was packed tightly with 4x4 gauze and a dry dressing was applied.  She was extubated and brought to the PACU.  Counts were correct.  Imogene Burn. Georgette Dover, MD, [facility]  General/ Trauma Surgery  07/13/2014 8:58 PM

## 2014-07-13 NOTE — Anesthesia Postprocedure Evaluation (Signed)
  Anesthesia Post-op Note  Patient: Kelly [name]  Procedure(s) Performed: Procedure(s): IRRIGATION AND DEBRIDEMEN TRIGHT BREAST ABSCESS (Right)  Patient Location: PACU  Anesthesia Type:General  Level of Consciousness: awake and alert   Airway and Oxygen Therapy: Patient Spontanous Breathing  Post-op Pain: mild  Post-op Assessment: Post-op Vital signs reviewed  Post-op Vital Signs: stable  Last Vitals:  Filed Vitals:   07/13/14 2136  BP:   Pulse: 67  Temp: 36.7 C  Resp: 16    Complications: No apparent anesthesia complications

## 2014-07-13 NOTE — ED Notes (Signed)
Pt reports large raised swollen area to R breast. Upon arrival yellow/green colored discharge from area noted. 10/10 pain. Pt states she noticed area x2 weeks ago, swelling has increased since.

## 2014-07-14 ENCOUNTER — Encounter (HOSPITAL_COMMUNITY): Payer: Self-pay | Admitting: Surgery

## 2014-07-14 DIAGNOSIS — R634 Abnormal weight loss: Secondary | ICD-10-CM

## 2014-07-14 DIAGNOSIS — Z515 Encounter for palliative care: Secondary | ICD-10-CM

## 2014-07-14 DIAGNOSIS — G8918 Other acute postprocedural pain: Secondary | ICD-10-CM | POA: Insufficient documentation

## 2014-07-14 LAB — CBC
HCT: 36.8 % (ref 36.0–46.0)
Hemoglobin: 12.2 g/dL (ref 12.0–15.0)
MCH: 32.4 pg (ref 26.0–34.0)
MCHC: 33.2 g/dL (ref 30.0–36.0)
MCV: 97.6 fL (ref 78.0–100.0)
Platelets: 380 10*3/uL (ref 150–400)
RBC: 3.77 MIL/uL — ABNORMAL LOW (ref 3.87–5.11)
RDW: 12.7 % (ref 11.5–15.5)
WBC: 8.5 10*3/uL (ref 4.0–10.5)

## 2014-07-14 MED ORDER — OXYCODONE HCL 5 MG PO TABS
20.0000 mg | ORAL_TABLET | ORAL | Status: DC | PRN
  Administered 2014-07-14 – 2014-07-15 (×7): 20 mg via ORAL
  Filled 2014-07-14 (×8): qty 4

## 2014-07-14 MED ORDER — HYDROMORPHONE HCL 1 MG/ML IJ SOLN
1.0000 mg | Freq: Once | INTRAMUSCULAR | Status: AC
  Administered 2014-07-14: 1 mg via INTRAVENOUS
  Filled 2014-07-14: qty 1

## 2014-07-14 MED ORDER — HYDROMORPHONE HCL 1 MG/ML IJ SOLN
1.5000 mg | INTRAMUSCULAR | Status: DC | PRN
  Administered 2014-07-14 (×5): 1.5 mg via INTRAVENOUS
  Filled 2014-07-14 (×4): qty 2

## 2014-07-14 MED ORDER — SODIUM CHLORIDE 0.9 % IJ SOLN: 3.0000 mL | INTRAMUSCULAR | Status: DC | PRN

## 2014-07-14 MED ORDER — HYDROMORPHONE HCL 1 MG/ML IJ SOLN
1.5000 mg | Freq: Two times a day (BID) | INTRAMUSCULAR | Status: DC | PRN
  Administered 2014-07-14 (×3): 1.5 mg via INTRAVENOUS
  Filled 2014-07-14 (×3): qty 2

## 2014-07-14 MED ORDER — SODIUM CHLORIDE 0.9 % IJ SOLN: 3.0000 mL | Freq: Two times a day (BID) | INTRAMUSCULAR | Status: DC

## 2014-07-14 NOTE — Consult Note (Signed)
Patient NW:GNFAOZH N Belt      DOB: 30-Jun-1985      [name]     Consult Note from the Palliative Medicine Team at [place] Requested by: Phil Dopp NP    PCP: Tamsen Roers, MD Reason for Consultation:Pain Management     Phone Number:[phone]  Assessment/Recommendations: 29 yo female with PMHx of Heroin Abuse now on methadone who presented with breast abscess. Now with difficult to control post-op pain.   1. Post-Op Pain: Challenging situation with methadone use and recent opioid/heroin dependence.  Has allergy issues as well. Taken oxycodone in past. In checking [place] CSRS, she does not have any recent concerning opioid prescribing patterns that I see.  Denies any addiciton/habituation issues with oxycodone in past.  Very difficult to convert to PRN pain regimen from methadone given its complex pharmacodynamics.   -1.56m IV dilaudid has been helpful. Equivalent dose of oxycodone is 279mand I will switch PRN dilaudid to this. Will allow BID PRN IV dilaudid prior to dressing changes - If home tomorrow, may be reasonable to continue oxycodone 10-2012m4h PRN and allow extra 7m62mr prior to dressing changes - Will be challenging situation given her opioid history and ultimately how this effects her pain receptors/modulation, but will need close outpatient follow-up and recommend rapid down titration of PRN oxycodone (5-7mg68mek).  2. Unintentional Wt Loss: I strongly encouraged her to see PCP and keep log of her weights on home scale. Tested for HIV and hepatitis last year.  Her Hep C Ab was actually positive and this will need fu with potentially testing HCV RNA.  She has used IV drugs since last tested as well.    Brief HPI: 28 yo22emale with PMHx of heroin abuse now on methadone, scoliosis who was admitted yesterday for I+D of breast abscess.  She underwent successful I+D of right breast abscess but had significant issues with pain post-operatively, especially  around dressing changes. Pain mostly mild-moderate with PRN dilaudid (3 doses overnight) in the setting of increased doses from 0.5-1.5mg o29m past 24hrs.  At time of dressing change, her pain was 10/10 despite dilaudid being given 1hr prior.  She states that she has been clean from heroin for the past 6 months and started taking methadone 30mg a17m 2 weeks ago.  Had history of long-standing opioid use from scoliosis surgery when she was 12.  Sh69denies having any sedation, N/V, pruritis with current opioid dosing.  In addition to above, she has had unintential weight loss of ~30lbs in past 3 months.  Has not seen PCP about this. No changes in diet/exercise patterns.     ROS: Full ROS negative unless otherwise mentioned above    PMH:  Past Medical History  Diagnosis Date  . Hypertension   . ADD (attention deficit disorder)   . Bipolar 1 disorder   . Polysubstance abuse   . Suicidal overdose   . Abscess of breast     rt breast  . Hepatitis     hx of hep c     PSH: Past Surgical History  Procedure Laterality Date  . Cesarean section    . Back surgery      scoliosis  . Cholecystectomy  March 2015  . Irrigation and debridement abscess Right 07/13/2014    Procedure: IRRIGATION AND DEBRIDEMEN TRIGHT BREAST ABSCESS;  Surgeon: MatthewDonnie MesaLocation: [facility] OR; [place]: General;  Laterality: Right;   I have reviewed the FH and [place]  and  If appropriate update it with new information. Allergies  Allergen Reactions  . Hydrocodone Itching  . Morphine And Related Nausea And Vomiting   Scheduled Meds: . clindamycin (CLEOCIN) IV  600 mg Intravenous 3 times per day  . enoxaparin (LOVENOX) injection  40 mg Subcutaneous Q24H  . methadone  30 mg Oral Daily  . nicotine  21 mg Transdermal Daily  . sodium chloride  3 mL Intravenous Q12H   Continuous Infusions:  PRN Meds:.acetaminophen **OR** acetaminophen, HYDROmorphone (DILAUDID) injection, ondansetron, sodium chloride    BP 93/57 mmHg   Pulse 98  Temp(Src) 97.8 F (36.6 C) (Oral)  Resp 16  Ht 5' 8"  (1.727 m)  Wt 78.79 kg (173 lb 11.2 oz)  BMI 26.42 kg/m2  SpO2 97%  LMP 07/13/2014   PPS: 100   Intake/Output Summary (Last 24 hours) at 07/14/14 1352 Last data filed at 07/14/14 1001  Gross per 24 hour  Intake 2054.58 ml  Output      0 ml  Net 2054.58 ml    Physical Exam:  General: Alert, NAD HEENT:  [place], sclera anciteric, mmm Neck: supple Chest:   CTAB, symm exp CVS: RRR Breast- Right breat with clean/packed surgical site and surrounding erythema Abdomen:soft, ND, NT, +BS Ext: no edema Neuro: no focal deficits Skin: warm/dry  Labs: CBC    Component Value Date/Time   WBC 8.5 07/14/2014 0814   RBC 3.77* 07/14/2014 0814   HGB 12.2 07/14/2014 0814   HCT 36.8 07/14/2014 0814   PLT 380 07/14/2014 0814   MCV 97.6 07/14/2014 0814   MCH 32.4 07/14/2014 0814   MCHC 33.2 07/14/2014 0814   RDW 12.7 07/14/2014 0814   LYMPHSABS 1.6 07/13/2014 1112   MONOABS 0.9 07/13/2014 1112   EOSABS 0.1 07/13/2014 1112   BASOSABS 0.0 07/13/2014 1112    BMET    Component Value Date/Time   NA 137 07/13/2014 1112   K 3.7 07/13/2014 1112   CL 102 07/13/2014 1112   CO2 26 07/13/2014 1112   GLUCOSE 103* 07/13/2014 1112   BUN 8 07/13/2014 1112   CREATININE 0.76 07/13/2014 1112   CALCIUM 9.7 07/13/2014 1112   GFRNONAA >90 07/13/2014 1112   GFRAA >90 07/13/2014 1112    CMP     Component Value Date/Time   NA 137 07/13/2014 1112   K 3.7 07/13/2014 1112   CL 102 07/13/2014 1112   CO2 26 07/13/2014 1112   GLUCOSE 103* 07/13/2014 1112   BUN 8 07/13/2014 1112   CREATININE 0.76 07/13/2014 1112   CALCIUM 9.7 07/13/2014 1112   PROT 7.9 08/24/2013 1711   ALBUMIN 4.0 08/24/2013 1711   AST 17 08/24/2013 1711   ALT <5 08/24/2013 1711   ALKPHOS 99 08/24/2013 1711   BILITOT 0.5 08/24/2013 1711   GFRNONAA >90 07/13/2014 1112   GFRAA >90 07/13/2014 [place] D.O. Palliative Medicine Team at [facility]  Pager: [phone] Team Phone: [phone]

## 2014-07-14 NOTE — Progress Notes (Signed)
INITIAL NUTRITION ASSESSMENT  DOCUMENTATION CODES Per approved criteria  -Not Applicable   INTERVENTION: Provide Magic Cup BID, each provides 290 kcal, 9 grams of protein  NUTRITION DIAGNOSIS: Increased nutrient needs related to wound healing as evidenced by estimated energy requirements.   Goal: Pt to meet >/= 90% of estimated energy requirements   Monitor:  PO intake, weight, labs  Reason for Assessment: Pt identified due to Malnutrition Screening Tool   29 y.o. female  Admitting Dx: <principal problem not specified>  ASSESSMENT: 29 y/o female with a history of scoliosis s/p surgery, recovering heroine addict (none reported in 4 months), hepatitis C, presenting with right sided breast pain due to abscess with duration of symptoms lasting 3 weeks. Underwent irrigation and debridement of right breast abscess 3/1.   Labs and medications reviewed. Pt confirmed significant wt loss, with normal appetite pta. She stated that she has been eating normally, but has lost approximately 35 lbs since Thanksgiving. She reported that her clothes have been fitting more loosely. Wt hx indicates 17% wt loss in 3 months. Pt stated that she has been tired, and doesn't feel like herself. Current PO intake is 75%. NFPE did not reveal any subcutaneous body fat or muscle mass depletion. Pt is not malnourished at this time, but if appetite and PO intake decline, she is at risk. Talked with pt about supplements, and she was interested in [employer]. Provided upon assessment.   Height: Ht Readings from Last 1 Encounters:  07/13/14 5' 8"  (1.727 m)    Weight: Wt Readings from Last 1 Encounters:  07/13/14 173 lb 11.2 oz (78.79 kg)    Ideal Body Weight: 140 lb (63.6 kg)  % Ideal Body Weight: 124%  Wt Readings from Last 10 Encounters:  07/13/14 173 lb 11.2 oz (78.79 kg)  08/24/13 202 lb 8 oz (91.853 kg)  08/24/13 190 lb (86.183 kg)  01/27/13 180 lb (81.647 kg)  01/26/13 180 lb (81.647 kg)   06/24/12 180 lb (81.647 kg)  01/31/12 170 lb (77.111 kg)  03/27/11 180 lb (81.647 kg)  12/24/07 181 lb 8 oz (82.328 kg)  12/03/07 180 lb (81.647 kg)    Usual Body Weight: 210 lb ([place])  % Usual Body Weight: 82%  BMI:  Body mass index is 26.42 kg/(m^2). overweight  Estimated Nutritional Needs: Kcal: 1900-2100 kcal Protein: 95-110 grams Fluid: >/=1.8L daily  Skin: right breast wound   Diet Order: Diet regular  EDUCATION NEEDS: -No education needs identified at this time   Intake/Output Summary (Last 24 hours) at 07/14/14 1147 Last data filed at 07/14/14 1001  Gross per 24 hour  Intake 2054.58 ml  Output      0 ml  Net 2054.58 ml    Last BM: PTA   Labs:   Recent Labs Lab 07/13/14 1112  NA 137  K 3.7  CL 102  CO2 26  BUN 8  CREATININE 0.76  CALCIUM 9.7  GLUCOSE 103*    CBG (last 3)  No results for input(s): GLUCAP in the last 72 hours.  Scheduled Meds: . clindamycin (CLEOCIN) IV  600 mg Intravenous 3 times per day  . enoxaparin (LOVENOX) injection  40 mg Subcutaneous Q24H  . methadone  30 mg Oral Daily  . nicotine  21 mg Transdermal Daily  . sodium chloride  3 mL Intravenous Q12H    Continuous Infusions:   Past Medical History  Diagnosis Date  . Hypertension   . ADD (attention deficit disorder)   . Bipolar 1  disorder   . Polysubstance abuse   . Suicidal overdose   . Abscess of breast     rt breast  . Hepatitis     hx of hep c    Past Surgical History  Procedure Laterality Date  . Cesarean section    . Back surgery      scoliosis  . Cholecystectomy  March 2015  . Irrigation and debridement abscess Right 07/13/2014    Procedure: IRRIGATION AND DEBRIDEMEN TRIGHT BREAST ABSCESS;  Surgeon: Donnie Mesa, MD;  Location: [facility];  Service: General;  Laterality: Right;    Wynona Dove, MS Dietetic Intern Pager: [phone]

## 2014-07-14 NOTE — Progress Notes (Signed)
Patient ID: Kelly [name], female   DOB: 1986-02-22, 29 y.o.   MRN: [id number]     [facility]      [address] [place], [place] [address]    Phone: [phone] FAX: [phone]     Subjective: Still sore but better.   Objective:  Vital signs:  Filed Vitals:   07/13/14 2136 07/13/14 2205 07/14/14 0231 07/14/14 0536  BP:  101/63 99/71 99/64   Pulse: 67 71 58 54  Temp: 98 F (36.7 C) 97.8 F (36.6 C) 98.4 F (36.9 C) 98.2 F (36.8 C)  TempSrc:  Oral    Resp: 16 16 16 16   Height:      Weight:      SpO2: 99% 100% 100% 100%    Last BM Date: 07/13/14  Intake/Output   Yesterday:  03/01 0701 - 03/02 0700 In: 1814.6 [I.V.:1764.6; IV Piggyback:50] Out: -  This shift:    I/O last 3 completed shifts: In: 1814.6 [I.V.:1764.6; IV Piggyback:50] Out: -     Physical Exam: General: Pt awake/alert/oriented x4 in no acute distress Skin: right breast wound without any residual pockets, surrounding erythema remains but is less impressive and less induration. Packing replaced    Problem List:   Active Problems:   Abscess of right breast    Results:   Labs: Results for orders placed or performed during the hospital encounter of 07/13/14 (from the past 48 hour(s))  CBC with Differential/Platelet     Status: Abnormal   Collection Time: 07/13/14 11:12 AM  Result Value Ref Range   WBC 14.4 (H) 4.0 - 10.5 K/uL   RBC 4.50 3.87 - 5.11 MIL/uL   Hemoglobin 15.0 12.0 - 15.0 g/dL   HCT 44.3 36.0 - 46.0 %   MCV 98.4 78.0 - 100.0 fL   MCH 33.3 26.0 - 34.0 pg   MCHC 33.9 30.0 - 36.0 g/dL   RDW 12.6 11.5 - 15.5 %   Platelets 432 (H) 150 - 400 K/uL   Neutrophils Relative % 82 (H) 43 - 77 %   Neutro Abs 11.9 (H) 1.7 - 7.7 K/uL   Lymphocytes Relative 11 (L) 12 - 46 %   Lymphs Abs 1.6 0.7 - 4.0 K/uL   Monocytes Relative 6 3 - 12 %   Monocytes Absolute 0.9 0.1 - 1.0 K/uL   Eosinophils Relative 1 0 - 5 %   Eosinophils Absolute 0.1  0.0 - 0.7 K/uL   Basophils Relative 0 0 - 1 %   Basophils Absolute 0.0 0.0 - 0.1 K/uL  Basic metabolic panel     Status: Abnormal   Collection Time: 07/13/14 11:12 AM  Result Value Ref Range   Sodium 137 135 - 145 mmol/L   Potassium 3.7 3.5 - 5.1 mmol/L   Chloride 102 96 - 112 mmol/L   CO2 26 19 - 32 mmol/L   Glucose, Bld 103 (H) 70 - 99 mg/dL   BUN 8 6 - 23 mg/dL   Creatinine, Ser 0.76 0.50 - 1.10 mg/dL   Calcium 9.7 8.4 - 10.5 mg/dL   GFR calc non Af Amer >90 >90 mL/min   GFR calc Af Amer >90 >90 mL/min    Comment: (NOTE) The eGFR has been calculated using the CKD EPI equation. This calculation has not been validated in all clinical situations. eGFR's persistently <90 mL/min signify possible Chronic Kidney Disease.    Anion gap 9 5 - 15  I-Stat CG4 Lactic Acid, ED  Status: None   Collection Time: 07/13/14 11:24 AM  Result Value Ref Range   Lactic Acid, Venous 0.93 0.5 - 2.0 mmol/L  CBC     Status: Abnormal   Collection Time: 07/14/14  8:14 AM  Result Value Ref Range   WBC 8.5 4.0 - 10.5 K/uL   RBC 3.77 (L) 3.87 - 5.11 MIL/uL   Hemoglobin 12.2 12.0 - 15.0 g/dL    Comment: REPEATED TO VERIFY   HCT 36.8 36.0 - 46.0 %   MCV 97.6 78.0 - 100.0 fL   MCH 32.4 26.0 - 34.0 pg   MCHC 33.2 30.0 - 36.0 g/dL   RDW 12.7 11.5 - 15.5 %   Platelets 380 150 - 400 K/uL    Imaging / Studies: No results found.  Medications / Allergies:  Scheduled Meds: . clindamycin (CLEOCIN) IV  600 mg Intravenous 3 times per day  . enoxaparin (LOVENOX) injection  40 mg Subcutaneous Q24H  .  HYDROmorphone (DILAUDID) injection  1 mg Intravenous Once  . methadone  30 mg Oral Daily  . nicotine  21 mg Transdermal Daily   Continuous Infusions: . lactated ringers 125 mL/hr at 07/14/14 0504   PRN Meds:.acetaminophen **OR** acetaminophen, HYDROmorphone (DILAUDID) injection, ondansetron  Antibiotics: Anti-infectives    Start     Dose/Rate Route Frequency Ordered Stop   07/13/14 1400  clindamycin  (CLEOCIN) IVPB 600 mg     600 mg 100 mL/hr over 30 Minutes Intravenous 3 times per day 07/13/14 1300          Assessment/Plan POD#1 I&D right breast abscess---Dr. Georgette Dover -start BID wet to dry dressing changes -clindamycin -follow cultures -Im not sure how to manage her pain.  She did not tolerate the dressing change well.  May need palliative medicine help.  Methadone clinic stated at discharge the patient simply need to bring her paperwork and they will dose meds -DC IVF -SCD/lovenox  Erby Pian, [facility] Pager [phone](7A-4:30P) For consults and floor pages call [phone](7A-4:30P)  07/14/2014 9:26 AM

## 2014-07-15 MED ORDER — ONDANSETRON 4 MG PO TBDP: 4.0000 mg | ORAL_TABLET | Freq: Three times a day (TID) | ORAL | Status: DC | PRN

## 2014-07-15 MED ORDER — OXYCODONE HCL 20 MG PO TABS: 20.0000 mg | ORAL_TABLET | ORAL | Status: DC | PRN

## 2014-07-15 MED ORDER — ACETAMINOPHEN 325 MG PO TABS: 650.0000 mg | ORAL_TABLET | Freq: Four times a day (QID) | ORAL | Status: DC | PRN

## 2014-07-15 MED ORDER — CEPHALEXIN 500 MG PO CAPS: 500.0000 mg | ORAL_CAPSULE | Freq: Four times a day (QID) | ORAL | Status: DC

## 2014-07-15 NOTE — Care Management Note (Signed)
  Page 1 of 1   07/15/2014     10:56:30 AM CARE MANAGEMENT NOTE 07/15/2014  Patient:  Kelly [name], Kelly [name]   Account Number:  [id number]  Date Initiated:  07/15/2014  Documentation initiated by:  Magdalen Spatz  Subjective/Objective Assessment:     Action/Plan:   Anticipated DC Date:  07/15/2014   Anticipated DC Plan:  [place]         Choice offered to / List presented to:  C-1 Patient        Standard arranged  HH-1 RN      [place].   Status of service:  Completed, signed off Medicare Important Message given?   (If response is "NO", the following Medicare IM given date fields will be blank) Date Medicare IM given:   Medicare IM given by:   Date Additional Medicare IM given:   Additional Medicare IM given by:    Discharge Disposition:    Per UR Regulation:    If discussed at Long Length of Stay Meetings, dates discussed:    Comments:  07-15-14 Confirmed face sheet information with patient . Magdalen Spatz RN BSN [phone]

## 2014-07-15 NOTE — Discharge Instructions (Signed)
WOUND CARE  It is important that the wound be kept open.   -Keeping the skin edges apart will allow the wound to gradually heal from the base upwards.   - If the skin edges of the wound close too early, a new fluid pocket can form and infection can occur. -This is the reason to pack deeper wounds with gauze or ribbon -This is why drained wounds cannot be sewed closed right away  A healthy wound should form a lining of bright red "beefy" granulating tissue that will help shrink the wound and help the edges grow new skin into it.   -A little mucus / yellow discharge is normal (the body's natural way to try and form a scab) and should be gently washed off with soap and water with daily dressing changes.  -Green or foul smelling drainage implies bacterial colonization and can slow wound healing - a short course of antibiotic ointment (3-5 days) can help it clear up.  Call the doctor if it does not improve or worsens  -Avoid use of antibiotic ointments for more than a week as they can slow wound healing over time.    -Sometimes other wound care products will be used to reduce need for dressing changes and/or help clean up dirty wounds -Sometimes the surgeon needs to debride the wound in the office to remove dead or infected tissue out of the wound so it can heal more quickly and safely.    Change the dressing at least once a day -Wash the wound with mild soap and water gently every day.  It is good to shower or bathe the wound to help it clean out. -Use clean 4x4 gauze for medium/large wounds or ribbon plain NU-gauze for smaller wounds (it does not need to be sterile, just clean) -Keep the raw wound moist with a little saline or KY (saline) gel on the gauze.  -A dry wound will take longer to heal.  -Keep the skin dry around the wound to prevent breakdown and irritation. -Pack the wound down to the base -The goal is to keep the skin apart, not overpack the wound -Use a Q-tip or blunt-tipped kabob  stick toothpick to push the gauze down to the base in narrow or deep wounds   -Cover with a clean gauze and tape -paper or Medipore tape tend to be gentle on the skin -rotate the orientation of the tape to avoid repeated stress/trauma on the skin -using an ACE or Coban wrap on wounds on arms or legs can be used instead.  Complete all antibiotics through the entire prescription to help the infection heal and prevent new places of infection   Returning the see the surgeon is helpful to follow the healing process and help the wound close as fast as possible.  Pain management -the pain will get better over the next week.  For now, you can take a oxycodone about an hour before and as needed, you will need to decrease day by day and be completely off in a week or so.  You were provided with 50 tablets.  Your clinic can help you dose if you need help.  You may call us if needed.  Taking tylenol, ibuprofen may also help.  You can also do a warm compress if you would like.    Home Health Services will be provided by [facility] [phone] or [phone]

## 2014-07-15 NOTE — Discharge Summary (Signed)
Physician Discharge Summary  AYSHIA GRAMLICH VPX:[id number] DOB: 1985/09/23 DOA: 07/13/2014  PCP: Tamsen Roers, MD  Consultation: none  Admit date: 07/13/2014 Discharge date: 07/15/2014  Recommendations for Outpatient Follow-up:   Follow-up Information    Follow up with TSUEI,MATTHEW K., MD. Call in 2 weeks.   Specialty:  General Surgery   Contact information:   [place] [address] [phone]      Discharge Diagnoses:  1. Right breast abscess   Surgical Procedure: I&D right breast abscess---Dr. 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 she was found to have a large right sided breast abscess and cellulitis.  She was admitted and underwent an I&D.  On POD#1 a palliative care consult was obtained to help with her pain as she takes methadone.  Palliative care recommend oxycodone q4h prn.  We discussed this.  I also called her methadone clinic who will help the patient dose and wean off over the next week as her wound heals and inflammation settles.  We discussed the potential for addiction, she verbalizes understanding. On POD#2 the patient was felt stable for discharge home.  She was provided with an Rx for keflex for GPC, awaiting final cultures.  i have scheduled her a follow up for a wound check. Warning signs that warrant immediate attention were discussed. Medication risks, benefits and therapeutic alternatives were reviewed with the patient.  She verbalizes understanding.  Encouraged to call with questions or concerns.     Physical Exam: General:  Pt awake/alert/oriented x4 in no acute distress Skin: right breast wound without any residual pockets, surrounding erythema remains but is less impressive and less induration. Packing replaced   Discharge Instructions     Medication List    TAKE these medications        acetaminophen 325 MG tablet  Commonly known as:  TYLENOL  Take 2 tablets (650 mg total) by mouth every 6 (six) hours as needed for mild pain (or Temp > 100).     amphetamine-dextroamphetamine 20 MG tablet  Commonly known as:  ADDERALL  Take 20 mg by mouth 2 (two) times daily.     cephALEXin 500 MG capsule  Commonly known as:  KEFLEX  Take 1 capsule (500 mg total) by mouth 4 (four) times daily.     fluticasone 50 MCG/ACT nasal spray  Commonly known as:  FLONASE  Place 1 spray into both nostrils daily. For allergies     gabapentin 400 MG capsule  Commonly known as:  NEURONTIN  Take 1 capsule (400 mg total) by mouth 3 (three) times daily. For substance withdrawal syndrome     ibuprofen 800 MG tablet  Commonly known as:  ADVIL,MOTRIN  Take 800 mg by mouth every 6 (six) hours as needed for moderate pain.     loratadine 10 MG tablet  Commonly known as:  CLARITIN  Take 1 tablet (10 mg total) by mouth daily as needed for allergies. (May purchase this medicine from over the counter at yr local pharmacy): For allergies  methadone 10 MG tablet  Commonly known as:  DOLOPHINE  Take 10 mg by mouth daily.     Oxycodone HCl 20 MG Tabs  Take 1 tablet (20 mg total) by mouth every 4 (four) hours as needed for moderate pain or severe pain.     phenazopyridine 200 MG tablet  Commonly known as:  PYRIDIUM  Take 1 tablet (200 mg total) by mouth 3 (three) times daily as needed for pain.           Follow-up Information    Follow up with TSUEI,MATTHEW K., MD. Call in 2 weeks.   Specialty:  General Surgery   Contact information:   [place] [address] [phone]        The results of  significant diagnostics from this hospitalization (including imaging, microbiology, ancillary and laboratory) are listed below for reference.    Significant Diagnostic Studies: No results found.  Microbiology: Recent Results (from the past 240 hour(s))  Anaerobic culture     Status: None (Preliminary result)   Collection Time: 07/13/14  9:03 PM  Result Value Ref Range Status   Specimen Description ABSCESS BREAST RIGHT  Final   Special Requests PATIENT ON FOLLOWING CLEOCIN  Final   Gram Stain   Final    ABUNDANT WBC PRESENT, PREDOMINANTLY PMN NO SQUAMOUS EPITHELIAL CELLS SEEN ABUNDANT GRAM POSITIVE COCCI IN PAIRS IN CHAINS IN CLUSTERS Performed at [facility]    Culture   Final    NO ANAEROBES ISOLATED; CULTURE IN PROGRESS FOR 5 DAYS Performed at [facility]    Report Status PENDING  Incomplete  Culture, routine-abscess     Status: None (Preliminary result)   Collection Time: 07/13/14  9:03 PM  Result Value Ref Range Status   Specimen Description ABSCESS BREAST RIGHT  Final   Special Requests PATIENT ON FOLLOWING CLEOCIN  Final   Gram Stain   Final    ABUNDANT WBC PRESENT, PREDOMINANTLY PMN NO SQUAMOUS EPITHELIAL CELLS SEEN ABUNDANT GRAM POSITIVE COCCI IN PAIRS IN CHAINS IN CLUSTERS Performed at [facility]    Culture PENDING  Incomplete   Report Status PENDING  Incomplete     Labs: Basic Metabolic Panel:  Recent Labs Lab 07/13/14 1112  NA 137  K 3.7  CL 102  CO2 26  GLUCOSE 103*  BUN 8  CREATININE 0.76  CALCIUM 9.7   Liver Function Tests: No results for input(s): AST, ALT, ALKPHOS, BILITOT, PROT, ALBUMIN in the last 168 hours. No results for input(s): LIPASE, AMYLASE in the last 168 hours. No results for input(s): AMMONIA in the last 168 hours. CBC:  Recent Labs Lab 07/13/14 1112 07/14/14 0814  WBC 14.4* 8.5  NEUTROABS 11.9*  --   HGB 15.0 12.2  HCT 44.3 36.8  MCV 98.4 97.6  PLT 432* 380   Cardiac Enzymes: No results  for input(s): CKTOTAL, CKMB, CKMBINDEX, TROPONINI in the last 168 hours. BNP: BNP (last 3 results) No results for input(s): BNP in the last 8760 hours.  ProBNP (last 3 results) No results for input(s): PROBNP in the last 8760 hours.  CBG: No results for input(s): GLUCAP in the last 168 hours.  Active Problems:   Abscess of right breast   Acute post-operative pain   Unintentional weight loss   Palliative care encounter   Signed:  Kyla Duffy, ANP-BC

## 2014-07-15 NOTE — Progress Notes (Signed)
  DC home with friend, verbally understood DC instructions

## 2014-07-16 LAB — CULTURE, ROUTINE-ABSCESS

## 2014-07-18 LAB — ANAEROBIC CULTURE

## 2014-09-04 NOTE — Consult Note (Signed)
Patient has positive antibody test for Hepatitis C,  Will get hepatitis C viral load.  She would be a good candidate for treatment but I do not treat patients for this and will refer her to one of my partners.  Talked to Dr. Allen Norris and he feels the bilirubin could be still up due to spasm , will wait for tomorrow and if still up to this level will get MRCP.  Electronic Signatures: Manya Silvas (MD)  (Signed on 16-Feb-15 13:03)  Authored  Last Updated: 16-Feb-15 13:03 by Manya Silvas (MD)

## 2014-09-04 NOTE — Consult Note (Signed)
Pt TB came down, MRCP neg. blood work positive for Hep C active infection.  She should make follow up visit our office to arrange for possible treatment after recover from surgery.  i will sign off, reconsult if needed  Electronic Signatures: Manya Silvas (MD)  (Signed on 19-Feb-15 14:55)  Authored  Last Updated: 19-Feb-15 14:55 by Manya Silvas (MD)

## 2014-09-04 NOTE — Consult Note (Signed)
VSS afebrile but TB up to 6.  Discussed with Dr. Candace Cruise and Dr. Rexene Edison and patient and will get MRCP to be sure nothing in the duct.    Electronic Signatures: Manya Silvas (MD)  (Signed on 18-Feb-15 09:45)  Authored  Last Updated: 18-Feb-15 09:45 by Manya Silvas (MD)

## 2014-09-04 NOTE — Op Note (Signed)
PATIENT NAME:  [place] [name], DELBRIDGE MR#:  [id number] DATE OF BIRTH:  December 28, 1985  DATE OF PROCEDURE:  06/30/2013  SURGEON:  Dr. Rexene Edison  PREOPERATIVE DIAGNOSIS: Acute cholecystitis, choledocholithiasis.   POSTOPERATIVE DIAGNOSIS: Acute cholecystitis, choledocholithiasis.  PROCEDURE PERFORMED: Laparoscopic cholecystectomy.   ESTIMATED BLOOD LOSS: 50 mL  COMPLICATIONS: None.   SPECIMENS: Gallbladder with stones.   ANESTHESIA: General endotracheal.  IMPLANTS:  JP drain in gallbladder fossa.  INDICATION FOR SURGERY:  [name] is a pleasant 29 year old female with history of choledocholithiasis status post ERCP with sludge removal. She was brought to the operating room suite for laparoscopic cholecystectomy.   DETAILS OF PROCEDURE:  As follows:  Informed consent was obtained. Ms. Rocks was brought to the operating room suite. She was induced. Endotracheal tube was placed and anesthesia was administered. Her abdomen was then prepped and draped in the standard surgical fashion. A timeout was then performed correctly identifying the patient name, operative site and procedure to be performed. A supraumbilical incision was made and deepened to her fascia. The fascia was incised. The peritoneum was entered.  Two stay sutures were placed through the fasciotomy. An 11 mm epigastric and two 5 mm right subcostal trocars were placed at the midclavicular and anterior axillary line. The gallbladder was severely adhesed to surrounding omentum and this was removed painstakingly. The gallbladder was quite large. There were 1 or 2 areas of adhesions between omentum and the liver and there were small areas of decapsulization which were treated hemostatically. The cystic duct was then isolated and the cystic artery was isolated as well. Critical view was obtained. There was a posterior branch upon obtaining the critical view which was clipped and ligated. The cystic duct and cystic artery were then clipped and  ligated and the gallbladder was then taken off the gallbladder fossa. It was quite large. There was a small area of bleeding at the fundus which was actually treated with cautery and 2 clips were placed for concern that there was maybe a small vessel there which was very superficial. The gallbladder was then taken out through the supraumbilical incision. The fossa was then examined as hemostatic, but due to the extent of bleeding from all the inflammation,and in case additional ERCP would need to be done in the future, a JP drain was placed through the right lower quadrant port site. The gallbladder fossa was made hemostatic. Following this, the trocars were removed under direct visualization. A figure-of-eight was used to close the infraumbilical trocar. 0-4 Ethibond were then used to close the skin in a deep dermal fashion.  Steri-Strips, Telfa gauze and Tegaderm were then used to complete the dressings. The patient was then awoken, extubated and brought to the postanesthesia care unit. There were no immediate complications. Needle, sponge and instrument counts were correct at the end of the procedure.    ____________________________ Glena Norfolk. Eleisha Branscomb, MD cal:ce D: 06/30/2013 16:23:52 ET T: 06/30/2013 16:35:12 ET JOB#: [id number]  cc: Harrell Gave A. Gilbert Manolis, MD, <Dictator> Floyde Parkins MD ELECTRONICALLY SIGNED 07/02/2013 9:39

## 2014-09-04 NOTE — Consult Note (Signed)
PATIENT NAME:  [place] [name], [place] [name] MR#:  [id number] DATE OF BIRTH:  [birth year]  DATE OF CONSULTATION:  06/27/2013  CONSULTING PHYSICIAN:  Manya Silvas, MD  HISTORY OF PRESENT ILLNESS: The patient is a 29 year old white female who has been ill for a week with malaise, fatigue. She noticed a darkening of her urine a week ago, followed by right upper quadrant abdominal pain and anorexia. Was seen and admitted to the hospital because of jaundice. Ultrasound of the abdomen showed gallstones and dilated common bile duct. I was asked to see her in consultation.   PAST MEDICAL HISTORY: Scoliosis surgery at [name], a very long incision, ADHD, chronic back pain, previous C-section.   ALLERGIES: MORPHINE CAUSES NAUSEA AND VOMITING. VICODIN CAUSES ITCHING.   CURRENT MEDICATIONS ON ADMISSION: Adderall and Percocet.   HABITS: Does smoke. Does drink some alcohol. Denies illegal drugs.   PAST MEDICAL HISTORY: She was evaluated by Dr. Erskine Emery in [place] for possible Crohn's, possible ulcerative colitis, possibly irritable bowel. Had a colonoscopy about age 75. The last time she passed any blood in her bowels was a couple of months ago. She was placed on medications for a year, lost her insurance and has not had them for several years.   Urine pregnancy test is negative at this time.   PHYSICAL EXAMINATION:  GENERAL: A white female examined in the presence of her father.  HEENT: Sclerae are icteric. Conjunctivae negative. The head is atraumatic.  CHEST: Clear.  HEART: Showed no murmurs, gallops, clicks or rubs.  ABDOMEN: Bowel sounds are present, somewhat diminished. There is some right upper quadrant and epigastric discomfort.  VITAL SIGNS: Flow sheet: Temperature 97.6, pulse 54 to 64, respirations 20, blood pressure 108/56, O2 saturation 93% on room air.   LABORATORY DATA: Total bilirubin 6.8, alkaline phosphatase 273, SGOT 808, SGPT 307. White count 6.6, hemoglobin 15.2, platelet count 359.  Urinalysis shows 2+ bilirubin. Negative pregnancy test. Glucose 84, BUN 7, creatinine 0.8, sodium 136, potassium 3.8, chloride 98, CO2 34, calcium 9.7, ammonia 33, lipase 115. Ultrasound of the abdomen shows 9 mm distal common duct, thickened gallbladder wall measuring 3.4 cm, filled with stones.   ASSESSMENT: Common bile duct stone causing obstructive jaundice.   RECOMMENDATIONS:  1. Continue Unasyn.  2. Dr. Candace Cruise was contacted, and even though he is not on call this weekend, he has agreed to come in and do an ERCP on her at 11:00 a.m. on Sunday. The nursing super has been notified.   ____________________________ Manya Silvas, MD rte:lb D: 06/27/2013 12:26:42 ET T: 06/27/2013 12:46:35 ET JOB#: [id number]  cc: Manya Silvas, MD, <Dictator> Mark A. Marina Gravel, MD Lupita Dawn. Candace Cruise, MD Manya Silvas MD ELECTRONICALLY SIGNED 07/23/2013 16:38

## 2014-09-04 NOTE — Consult Note (Signed)
Pt with bilirubin still 8.6 after ERCP yesterday.  Will consult Dr. Allen Norris since Dr. Candace Cruise not here today.  Pt also tested positive for Hep C antibody.  Electronic Signatures: Manya Silvas (MD)  (Signed on 16-Feb-15 06:51)  Authored  Last Updated: 16-Feb-15 06:51 by Manya Silvas (MD)

## 2014-09-04 NOTE — H&P (Signed)
 Subjective/Chief Complaint 1 week abdominal pain   History of Present Illness 29 y/o female presents with 1 week of not feeling well and malaise,  Initially noted darkening of urine 1 week ago, followed by worsening RUQ abdominal pain and anorexia. No fevers, no previous hx of similar type symptoms.  no sick exposures, no new drugs, social etoh usage, no IV drug usage.  Mother had gallstones.  Takes chronic oxycodone for chronic back pain under care of her PCP.  She has been seen in past by gastroenterologist for chronic abdominal pain.   Past History scoliosis and scoliosis surgery age 13 c section tobacco abuse and dependence.   Past Med/Surgical Hx:  ADHD - Attention Deficit Disorder:   Chronic Back Pain:   scoliosis:   Back Surgery:   Cesarean Section:   ALLERGIES:  Morphine: N/V/Diarrhea  Vicodin: Itching  HOME MEDICATIONS: Medication Instructions Status  Adderall  orally  Active  percocet  Active    Medications oxycodone 5 mg po up to five times per day.   Family and Social History:  Family History Non-Contributory  Other  gallstones   Social History positive  tobacco, positive ETOH, negative Illicit drugs   + Tobacco Current (within 1 year)   Place of Living Home   Review of Systems:  Subjective/Chief Complaint see above   Abdominal Pain Yes   Nausea/Vomiting No   Dysuria No   Tolerating Diet No   Medications/Allergies Reviewed Medications/Allergies reviewed   Physical Exam:  GEN no acute distress, obese, disheveled, scleral icterus. temp 97.5 p 56 bp 141/93 rr 18/min   HEENT PERRL, hearing intact to voice   NECK supple   RESP normal resp effort  clear BS   CARD regular rate   ABD positive tenderness  positive Flank Tenderness  no liver/spleen enlargement  no hernia  soft  normal BS  tender ruq and right flank. positive murphy's sign.   LYMPH negative neck   EXTR negative cyanosis/clubbing   SKIN normal to palpation   NEURO cranial  nerves intact   PSYCH A+O to time, place, person, good insight   Lab Results: Hepatic:  14-Feb-15 00:11   Bilirubin, Total  6.8  Alkaline Phosphatase  273 (45-117 NOTE: New Reference Range 04/03/13)  SGPT (ALT)  307  SGOT (AST)  808  Total Protein, Serum 7.5  Albumin, Serum 3.6  Routine Chem:  14-Feb-15 00:11   Glucose, Serum 84  BUN 7  Creatinine (comp) 0.81  Sodium, Serum 136  Potassium, Serum 3.8  Chloride, Serum 98  CO2, Serum  34  Calcium (Total), Serum 9.7  Osmolality (calc) 269  eGFR (African American) >60  eGFR (Non-African American) >60 (eGFR values <60mL/min/1.73 m2 may be an indication of chronic kidney disease (CKD). Calculated eGFR is useful in patients with stable renal function. The eGFR calculation will not be reliable in acutely ill patients when serum creatinine is changing rapidly. It is not useful in  patients on dialysis. The eGFR calculation may not be applicable to patients at the low and high extremes of body sizes, pregnant women, and vegetarians.)  Anion Gap  4  Ammonia, Plasma  33 (Result(s) reported on 27 Jun 2013 at 12:52AM.)  Routine UA:  13-Feb-15 23:52   Color (UA) Amber  Clarity (UA) Clear  Glucose (UA) Negative  Bilirubin (UA) 2+  Ketones (UA) Negative  Specific Gravity (UA) 1.018  Blood (UA) Negative  pH (UA) 6.0  Protein (UA) Negative  Nitrite (UA) Negative  Leukocyte Esterase (  UA) Negative (Result(s) reported on 27 Jun 2013 at 01:03AM.)  RBC (UA) 1 /HPF  WBC (UA) 2 /HPF  Bacteria (UA) TRACE  Epithelial Cells (UA) 2 /HPF (Result(s) reported on 27 Jun 2013 at 01:03AM.)  Routine Sero:  13-Feb-15 23:52   Pregnancy Test, Urine NEGATIVE (The results of the qualitative urine HCG (Pregnancy Test) should be evaluated in light of other clinical information.  There are limitations to the test which, in certain clinical situations, may result in a false positive or negative result. Thehigh dose hook effect can occur in urine samples  with extremely high HCG concentrations.  This effect can produce a negative result in certain situations. It is suggested that results of the qualitative HCG be confirmed by an alternate methodology, such as the quantitative serum beta HCG test.)  Routine Hem:  14-Feb-15 00:11   WBC (CBC) 6.6  RBC (CBC) 4.48  Hemoglobin (CBC) 15.2  Hematocrit (CBC) 44.7  Platelet Count (CBC) 359  MCV 100  MCH 34.0  MCHC 34.1  RDW 13.1  Neutrophil % 68.7  Lymphocyte % 17.5  Monocyte % 7.2  Eosinophil % 6.6  Basophil % 0.0  Neutrophil # 4.5  Lymphocyte # 1.2  Monocyte # 0.5  Eosinophil # 0.4  Basophil # 0.0 (Result(s) reported on 27 Jun 2013 at 12:27AM.)   Radiology Results: US:    14-Feb-15 02:03, US Abdomen Limited Survey  US Abdomen Limited Survey  REASON FOR EXAM:    JAUNDICE, RUQ, ELEVATED LIVER ENZYMES  COMMENTS:   Body Site: Gallbladder, Liver, Common Bile Duct    PROCEDURE: US  - US ABDOMEN LIMITED SURVEY  - Jun 27 2013  2:03AM     CLINICAL DATA:  Jaundice, elevated liver enzymes.    EXAM:  US ABDOMEN LIMITED - RIGHT UPPER QUADRANT    COMPARISON:  CT abdomen and pelvis 03/27/2011    FINDINGS:  Gallbladder:  Partially contracted and filled with stones. Wall is mildly  thickened, measuring 3.4 mm in thickness. Sonographic Murphy's sign  was positive.    Common bile duct:    Diameter: 9 mm.  Distal common bile duct not visualized.    Liver:    No focal lesion identified. Within normal limits in parenchymal  echogenicity. No definite intrahepatic biliary dilatation  identified.     IMPRESSION:  1. Partially contracted and stone filled gallbladder with mild wall  thickening and positive sonographic Murphy sign. Acute cholecystitis  is not excluded in the appropriate clinical setting.  2. Extrahepatic biliary dilatation. The distal common bile duct was  not visualized, and an obstructing distal stone is possible.      Electronically Signed    By: Allen  Grady     On: 06/27/2013 02:11         Verified By: ALLEN T. GRADY, M.D.,  LabUnknown:  PACS Image    Assessment/Admission Diagnosis 29 y/o with obstructive jaundice secondary to choledocholithiasis.   Chronic narcotic usage for back pain h/o scoliosis.   Plan Admit, hydrate, IV unasyn check lipase. GI consult for ercp.   Electronic Signatures: ,  (MD)  (Signed 14-Feb-15 03:38)  Authored: CHIEF COMPLAINT and HISTORY, PAST MEDICAL/SURGIAL HISTORY, ALLERGIES, HOME MEDICATIONS, OTHER MEDICATIONS, FAMILY AND SOCIAL HISTORY, REVIEW OF SYSTEMS, PHYSICAL EXAM, LABS, Radiology, ASSESSMENT AND PLAN   Last Updated: 14-Feb-15 03:38 by ,  (MD) 

## 2014-09-04 NOTE — Consult Note (Signed)
Pt seen in consultation, she likely has a CBD stone with gall bladder full of stones.  Will try to  contact Dr, Candace Cruise or Dr Allen Norris to see if they can do an ERCP and stone extraction.   Electronic Signatures: Manya Silvas (MD)  (Signed on 14-Feb-15 11:51)  Authored  Last Updated: 14-Feb-15 11:51 by Manya Silvas (MD)

## 2014-09-04 NOTE — Consult Note (Signed)
Please arrange for follow up my office after discharge due to Hep C and hx of "colitis"  Electronic Signatures: Manya Silvas (MD)  (Signed on 17-Feb-15 15:38)  Authored  Last Updated: 17-Feb-15 15:38 by Manya Silvas (MD)

## 2014-09-04 NOTE — Consult Note (Signed)
VSS afebrile, TB up to 8.2, SGPT 290, SGOT 920, WBC 5.9, pt with RUQ pain and tenderness.  Had some chills also.  For ERCP and stone removal today with Dr Candace Cruise.  Electronic Signatures: Manya Silvas (MD)  (Signed on 15-Feb-15 09:52)  Authored  Last Updated: 15-Feb-15 09:52 by Manya Silvas (MD)

## 2014-09-04 NOTE — Consult Note (Signed)
ERCP done for Dr. Vira Agar. CBD showed sludge but no obvious stones. Sludge removed. Excellent bile flow after biliary sphincterotomy. Clear liquid diet but NPO after MN for GB surgery tomorrow by Dr. Burt Knack. Thanks.  Electronic Signatures: Verdie Shire (MD)  (Signed on 15-Feb-15 12:24)  Authored  Last Updated: 15-Feb-15 12:24 by Verdie Shire (MD)

## 2014-09-04 NOTE — Discharge Summary (Signed)
PATIENT NAME:  Kelly [name], Kelly [name] MR#:  [id number] DATE OF BIRTH:  March 20, 1986  DATE OF ADMISSION:  06/27/2013 DATE OF DISCHARGE:  07/03/2013  DISCHARGE DIAGNOSES:  1.  Acute cholecystitis.  2.  Sponge choledocholithiasis.  3.  Hepatitis C.  4.  Chronic back pain due to scoliosis.  5.  History of cesarean section.  6.  History of tobacco use.  7.  Attention deficit/hyperactivity disorder.   DISCHARGE MEDICATIONS:  1.  Oxycodone 1 to 3 tabs p.o. q.4 hours p.r.n. pain.  2.  Ibuprofen 600 mg p.o. t.i.d.  3.  Augmentin 1 tab p.o. b.i.d. x 7 days.   INDICATION FOR ADMISSION: Ms. [name] is a pleasant 29 year old, who presented with epigastric pain and a week of not feeling well. She was noted to have elevated LFTs. She was brought in for management of likely choledocholithiasis and cholecystitis.   HOSPITAL COURSE: Ms. [name] was admitted on February 14. She was made n.p.o. and given IV fluids. She had an ERCP, which showed sludge and purulence. She was continued on antibiotics. On February 16, her LFTs were noted to still be going up. There was concern for a retained stone. Decision was made to not perform cholecystectomy at this time. She underwent cholecystectomy on February 17, which was consistent with acute cholecystitis. On postop day 1, she had LFTs which were elevated, which resolved on February 19. She was later discharged with good pain control, voiding and stooling without difficulties on February 20.   DISCHARGE INSTRUCTIONS: Ms. [name] is to call or return to the ED if she has increased pain, nausea, vomiting, redness, drainage from her incision. She is to call me if she has any issues.   ____________________________ Glena Norfolk Vora Clover, MD cal:aw D: 07/13/2013 19:31:25 ET T: 07/14/2013 08:22:56 ET JOB#: [id number]  cc: Harrell Gave A. Pate Aylward, MD, <Dictator> Floyde Parkins MD ELECTRONICALLY SIGNED 07/14/2013 17:41

## 2015-02-15 ENCOUNTER — Emergency Department (HOSPITAL_BASED_OUTPATIENT_CLINIC_OR_DEPARTMENT_OTHER): Payer: Medicaid Other

## 2015-02-15 ENCOUNTER — Encounter (HOSPITAL_BASED_OUTPATIENT_CLINIC_OR_DEPARTMENT_OTHER): Payer: Self-pay

## 2015-02-15 ENCOUNTER — Emergency Department (HOSPITAL_BASED_OUTPATIENT_CLINIC_OR_DEPARTMENT_OTHER)
Admission: EM | Admit: 2015-02-15 | Discharge: 2015-02-15 | Disposition: A | Discharge status: Home / Self Care | Payer: Medicaid Other | Attending: Emergency Medicine | Admitting: Emergency Medicine

## 2015-02-15 DIAGNOSIS — Z720 Tobacco use: Secondary | ICD-10-CM | POA: Diagnosis not present

## 2015-02-15 DIAGNOSIS — Z3202 Encounter for pregnancy test, result negative: Secondary | ICD-10-CM | POA: Insufficient documentation

## 2015-02-15 DIAGNOSIS — Z79899 Other long term (current) drug therapy: Secondary | ICD-10-CM | POA: Insufficient documentation

## 2015-02-15 DIAGNOSIS — I10 Essential (primary) hypertension: Secondary | ICD-10-CM | POA: Insufficient documentation

## 2015-02-15 DIAGNOSIS — R109 Unspecified abdominal pain: Secondary | ICD-10-CM

## 2015-02-15 DIAGNOSIS — R319 Hematuria, unspecified: Secondary | ICD-10-CM

## 2015-02-15 DIAGNOSIS — Z87448 Personal history of other diseases of urinary system: Secondary | ICD-10-CM | POA: Insufficient documentation

## 2015-02-15 DIAGNOSIS — N390 Urinary tract infection, site not specified: Secondary | ICD-10-CM | POA: Diagnosis not present

## 2015-02-15 DIAGNOSIS — F319 Bipolar disorder, unspecified: Secondary | ICD-10-CM | POA: Diagnosis not present

## 2015-02-15 HISTORY — DX: Dorsalgia, unspecified: M54.9

## 2015-02-15 LAB — URINALYSIS, ROUTINE W REFLEX MICROSCOPIC
Bilirubin Urine: NEGATIVE
GLUCOSE, UA: NEGATIVE mg/dL
Ketones, ur: NEGATIVE mg/dL
Nitrite: NEGATIVE
Protein, ur: NEGATIVE mg/dL
Specific Gravity, Urine: 1.021 (ref 1.005–1.030)
Urobilinogen, UA: 1 mg/dL (ref 0.0–1.0)
pH: 6 (ref 5.0–8.0)

## 2015-02-15 LAB — PREGNANCY, URINE: PREG TEST UR: NEGATIVE

## 2015-02-15 LAB — RAPID URINE DRUG SCREEN, HOSP PERFORMED
Amphetamines: NOT DETECTED
BENZODIAZEPINES: NOT DETECTED
Barbiturates: NOT DETECTED
COCAINE: POSITIVE — AB
Opiates: POSITIVE — AB
Tetrahydrocannabinol: NOT DETECTED

## 2015-02-15 LAB — URINE MICROSCOPIC-ADD ON

## 2015-02-15 MED ORDER — IBUPROFEN 800 MG PO TABS
800.0000 mg | ORAL_TABLET | Freq: Once | ORAL | Status: AC
  Administered 2015-02-15: 800 mg via ORAL
  Filled 2015-02-15: qty 1

## 2015-02-15 MED ORDER — PHENAZOPYRIDINE HCL 100 MG PO TABS
200.0000 mg | ORAL_TABLET | Freq: Once | ORAL | Status: AC
  Administered 2015-02-15: 200 mg via ORAL
  Filled 2015-02-15: qty 2

## 2015-02-15 MED ORDER — PHENAZOPYRIDINE HCL 200 MG PO TABS: 200.0000 mg | ORAL_TABLET | Freq: Three times a day (TID) | ORAL | Status: DC

## 2015-02-15 MED ORDER — SULFAMETHOXAZOLE-TRIMETHOPRIM 800-160 MG PO TABS
1.0000 | ORAL_TABLET | Freq: Once | ORAL | Status: AC
  Administered 2015-02-15: 1 via ORAL
  Filled 2015-02-15: qty 1

## 2015-02-15 MED ORDER — SULFAMETHOXAZOLE-TRIMETHOPRIM 800-160 MG PO TABS: 1.0000 | ORAL_TABLET | Freq: Two times a day (BID) | ORAL | Status: AC

## 2015-02-15 NOTE — ED Notes (Signed)
Pt c/o painful urination for the last month and states that over the last week she has had increased left flank pain.  States she has been taking ibuprofen, last dose at 2230, and percocet, last dose at 1800 for pain and a fever of 102 today and yesterday.  Also c/o nausea, states she past a kidney stone last week.

## 2015-02-15 NOTE — ED Notes (Signed)
Patient transported to CT 

## 2015-02-15 NOTE — ED Notes (Signed)
MD at bedside. 

## 2015-02-15 NOTE — ED Provider Notes (Addendum)
CSN: [id number]     Arrival date & time 02/15/15  1478 History   First MD Initiated Contact with Patient 02/15/15 [phone]     Chief Complaint  Patient presents with  . Flank Pain     (Consider location/radiation/quality/duration/timing/severity/associated sxs/prior Treatment) HPI  This is a 29 year old female with a history of substance abuse including crack cocaine and heroin. She is here with dysuria for a month. She had not sought treatment for this burning with urination. She has had occasional hematuria. She is here with a one-week history of pain in her left flank that is now radiating to her left lower quadrant. She rates her pain as an 8 out of 10. She states it is worsened over the past 3 days and she is now having fever to 102 with chills and nausea but no vomiting. She has been self medicating with ibuprofen and Percocet. A review of the [place] controlled substances database indicates that the last legitimate prescription for Percocet was filled 08/09/2014.  Past Medical History  Diagnosis Date  . Hypertension   . ADD (attention deficit disorder)   . Bipolar 1 disorder ([place])   . Polysubstance abuse   . Suicidal overdose ([place])   . Abscess of breast     rt breast  . Back pain    Past Surgical History  Procedure Laterality Date  . Cesarean section    . Back surgery      scoliosis  . Cholecystectomy  March 2015  . Irrigation and debridement abscess Right 07/13/2014    Procedure: IRRIGATION AND DEBRIDEMEN TRIGHT BREAST ABSCESS;  Surgeon: Donnie Mesa, MD;  Location: [facility];  Service: General;  Laterality: Right;   Family History  Problem Relation Age of Onset  . Diabetes Mother   . Diabetes Father   . Hypertension Father   . Heart attack Neg Hx   . Hyperlipidemia Neg Hx   . Sudden death Neg Hx    Social History  Substance Use Topics  . Smoking status: Current Every Day Smoker -- 1.00 packs/day for 13 years    Types: Cigarettes  . Smokeless tobacco: Never Used  .  Alcohol Use: Yes     Comment: one or two drinks weekly   OB History    No data available     Review of Systems  All other systems reviewed and are negative.   Allergies  Hydrocodone and Morphine and related  Home Medications   Prior to Admission medications   Medication Sig Start Date End Date Taking? Authorizing Provider  acetaminophen (TYLENOL) 325 MG tablet Take 2 tablets (650 mg total) by mouth every 6 (six) hours as needed for mild pain (or Temp > 100). 07/15/14   Emina Riebock, NP  amphetamine-dextroamphetamine (ADDERALL) 20 MG tablet Take 20 mg by mouth 2 (two) times daily.    Historical Provider, MD  cephALEXin (KEFLEX) 500 MG capsule Take 1 capsule (500 mg total) by mouth 4 (four) times daily. 07/15/14   Emina Riebock, NP  fluticasone (FLONASE) 50 MCG/ACT nasal spray Place 1 spray into both nostrils daily. For allergies Patient not taking: Reported on 07/13/2014 09/01/13   [name], NP  gabapentin (NEURONTIN) 400 MG capsule Take 1 capsule (400 mg total) by mouth 3 (three) times daily. For substance withdrawal syndrome Patient not taking: Reported on 07/13/2014 09/01/13   [name], NP  ibuprofen (ADVIL,MOTRIN) 800 MG tablet Take 800 mg by mouth every 6 (six) hours as needed for moderate pain.  Historical Provider, MD  loratadine (CLARITIN) 10 MG tablet Take 1 tablet (10 mg total) by mouth daily as needed for allergies. (May purchase this medicine from over the counter at yr local pharmacy): For allergies Patient not taking: Reported on 07/13/2014 09/01/13   [name], NP  methadone (DOLOPHINE) 10 MG tablet Take 10 mg by mouth daily.    Historical Provider, MD  ondansetron (ZOFRAN ODT) 4 MG disintegrating tablet Take 1 tablet (4 mg total) by mouth every 8 (eight) hours as needed for nausea or vomiting. 07/15/14   Emina Riebock, NP  Oxycodone HCl 20 MG TABS Take 1 tablet (20 mg total) by mouth every 4 (four) hours as needed. 07/15/14   Emina Riebock, NP  phenazopyridine (PYRIDIUM)  200 MG tablet Take 1 tablet (200 mg total) by mouth 3 (three) times daily as needed for pain. Patient not taking: Reported on 07/13/2014 10/01/13   Orpah Greek, MD   BP 130/87 mmHg  Pulse 96  Temp(Src) 97.8 F (36.6 C) (Oral)  Resp 20  Ht 5' 7"  (1.702 m)  Wt 160 lb (72.576 kg)  BMI 25.05 kg/m2  SpO2 100%  LMP 01/24/2015   Physical Exam  General: Well-developed, well-nourished female in no acute distress; appearance consistent with age of record HENT: normocephalic; atraumatic Eyes: pupils equal, round and reactive to light; extraocular muscles intact Neck: supple Heart: regular rate and rhythm Lungs: clear to auscultation bilaterally Abdomen: soft; nondistended; suprapubic tenderness; no masses or hepatosplenomegaly; bowel sounds present GU: No CVA tenderness Extremities: No deformity; full range of motion; pulses normal Neurologic: Awake, alert and oriented; motor function intact in all extremities and symmetric; no facial droop Skin: Warm and dry Psychiatric: Normal mood and affect    ED Course  Procedures (including critical care time)   MDM   Nursing notes and vitals signs, including pulse oximetry, reviewed.  Summary of this visit's results, reviewed by myself:  Labs:  Results for orders placed or performed during the hospital encounter of 02/15/15 (from the past 24 hour(s))  Urinalysis, Routine w reflex microscopic (not at [facility])     Status: Abnormal   Collection Time: 02/15/15  5:30 AM  Result Value Ref Range   Color, Urine YELLOW YELLOW   APPearance CLOUDY (A) CLEAR   Specific Gravity, Urine 1.021 1.005 - 1.030   pH 6.0 5.0 - 8.0   Glucose, UA NEGATIVE NEGATIVE mg/dL   Hgb urine dipstick SMALL (A) NEGATIVE   Bilirubin Urine NEGATIVE NEGATIVE   Ketones, ur NEGATIVE NEGATIVE mg/dL   Protein, ur NEGATIVE NEGATIVE mg/dL   Urobilinogen, UA 1.0 0.0 - 1.0 mg/dL   Nitrite NEGATIVE NEGATIVE   Leukocytes, UA SMALL (A) NEGATIVE  Pregnancy, urine     Status:  None   Collection Time: 02/15/15  5:30 AM  Result Value Ref Range   Preg Test, Ur NEGATIVE NEGATIVE  Urine rapid drug screen (hosp performed)     Status: Abnormal   Collection Time: 02/15/15  5:30 AM  Result Value Ref Range   Opiates POSITIVE (A) NONE DETECTED   Cocaine POSITIVE (A) NONE DETECTED   Benzodiazepines NONE DETECTED NONE DETECTED   Amphetamines NONE DETECTED NONE DETECTED   Tetrahydrocannabinol NONE DETECTED NONE DETECTED   Barbiturates NONE DETECTED NONE DETECTED  Urine microscopic-add on     Status: Abnormal   Collection Time: 02/15/15  5:30 AM  Result Value Ref Range   Squamous Epithelial / LPF FEW (A) RARE   WBC, UA 7-10 <3 WBC/hpf  RBC / HPF 3-6 <3 RBC/hpf   Bacteria, UA FEW (A) RARE    Imaging Studies: Ct Abdomen Pelvis Wo Contrast  02/15/2015   CLINICAL DATA:  Left flank pain and dysuria.  Fever.  EXAM: CT ABDOMEN AND PELVIS WITHOUT CONTRAST  TECHNIQUE: Multidetector CT imaging of the abdomen and pelvis was performed following the standard protocol without oral or intravenous contrast material administration.  COMPARISON:  March 27, 2011  FINDINGS: Lower chest:  Lung bases are clear.  Hepatobiliary: Liver is prominent, measuring 19.0 cm in length. No focal liver lesions are identified on this noncontrast enhanced study. Gallbladder is absent. There is no appreciable biliary duct dilatation.  Pancreas: No mass or inflammatory focus.  Spleen: No focal lesion. A small splenule is seen posterior to the spleen, stable.  Adrenals/Urinary Tract: Adrenals appear normal bilaterally. Kidneys show no evidence of mass or hydronephrosis on either side. There is no demonstrable renal or ureteral calculus on either side. Urinary bladder is midline and largely decompressed. Urinary bladder wall thickness is normal.  Stomach/Bowel: There is fairly diffuse stool throughout the colon. There is no bowel wall or mesenteric thickening. No bowel obstruction. No free air or portal venous air.   Vascular/Lymphatic: There is no demonstrable abdominal aortic aneurysm. No vascular lesion is appreciable on this noncontrast enhanced study. There is no demonstrable adenopathy in the abdomen for pelvis by size criteria. A few small periaortic lymph nodes are stable which do not meet size criteria for pathologic significance.  Reproductive: Uterus is anteverted. There is no pelvic or adnexal mass appreciable by CT. No free pelvic fluid.  Other: Appendix appears normal. No ascites or abscess is seen in the abdomen or pelvis.  Musculoskeletal: There is Harrington rod fixation in the lower thoracic and upper lumbar regions. There is levoscoliosis with rotatory component. No blastic or lytic bone lesions. No muscular lesions or abdominal wall lesions are identified.  IMPRESSION: Harrington rod fixation leading to a degree of artifact in the lower chest and upper abdominal regions.  No renal or ureteral calculus appreciable. No hydronephrosis. A cause for patient's symptoms has not been established with this study.  No bowel obstruction. No abscess. Appendix appears normal. Gallbladder is absent.   Electronically Signed   By: Lowella Grip III M.D.   On: 02/15/2015 07:07   We'll treat for possible early pyelonephritis.    Shanon Rosser, MD 02/15/15 [place], MD 02/15/15 [phone]

## 2015-02-15 NOTE — ED Notes (Signed)
Pt is not tender upon tympany of left and right flank, hx of chronic back pain.

## 2015-02-15 NOTE — ED Notes (Signed)
Pt returned from CT °

## 2015-02-17 LAB — URINE CULTURE

## 2015-02-18 ENCOUNTER — Telehealth (HOSPITAL_COMMUNITY): Payer: Self-pay

## 2015-02-18 NOTE — Telephone Encounter (Signed)
Post ED Visit - Positive Culture Follow-up: Successful Patient Follow-Up  Culture assessed and recommendations reviewed by: [x]  Heide Guile, Pharm.D., BCPS-AQ ID []  Alycia Rossetti, Pharm.D., BCPS []  Marceline, Pharm.D., BCPS, AAHIVP []  Legrand Como, Pharm.D., BCPS, AAHIVP []  [place], Pharm.D. []  Milus Glazier, Pharm.D.  Positive Urine culture, >/= 100,000 colonies -> E Coli  []  Patient discharged without antimicrobial prescription and treatment is now indicated [x]  Organism is resistant to prescribed ED discharge antimicrobial []  Patient with positive blood cultures  Changes discussed with ED provider:  L. Lucio Edward PA  New antibiotic prescription "Stop Bactrim, Cipro 500 mg po BID x 7 days"  Called to N/A  Contacted patient, date 02/18/15, time 9:23 LVM requesting callback.   Dortha Kern 02/18/2015, 9:25 AM

## 2015-02-18 NOTE — Progress Notes (Signed)
ED Antimicrobial Stewardship Positive Culture Follow Up   Kelly [name] is an 29 y.o. female who presented to [name] on 02/15/2015 with a chief complaint of  Chief Complaint  Patient presents with  . Flank Pain    Recent Results (from the past 720 hour(s))  Urine culture     Status: None   Collection Time: 02/15/15  5:30 AM  Result Value Ref Range Status   Specimen Description URINE, CLEAN CATCH  Final   Special Requests NONE  Final   Culture   Final    >=100,000 COLONIES/mL ESCHERICHIA COLI Performed at [facility]    Report Status 02/17/2015 FINAL  Final   Organism ID, Bacteria ESCHERICHIA COLI  Final      Susceptibility   Escherichia coli - MIC*    AMPICILLIN >=32 RESISTANT Resistant     CEFAZOLIN <=4 SENSITIVE Sensitive     CEFTRIAXONE <=1 SENSITIVE Sensitive     CIPROFLOXACIN <=0.25 SENSITIVE Sensitive     GENTAMICIN <=1 SENSITIVE Sensitive     IMIPENEM <=0.25 SENSITIVE Sensitive     NITROFURANTOIN <=16 SENSITIVE Sensitive     TRIMETH/SULFA >=320 RESISTANT Resistant     AMPICILLIN/SULBACTAM 16 INTERMEDIATE Intermediate     PIP/TAZO <=4 SENSITIVE Sensitive     * >=100,000 COLONIES/mL ESCHERICHIA COLI    [x]  Treated with Bactrim, organism resistant to prescribed antimicrobial []  Patient discharged originally without antimicrobial agent and treatment is now indicated  New antibiotic prescription: ciprofloxacin 581m PO BID x 7 days  ED Provider: LDelsa Grana PA-C   FCandie Mile10/11/2014, 7:58 AM Infectious Diseases Pharmacist Phone# [phone]

## 2015-02-19 ENCOUNTER — Telehealth (HOSPITAL_COMMUNITY): Payer: Self-pay

## 2015-02-21 ENCOUNTER — Telehealth (HOSPITAL_BASED_OUTPATIENT_CLINIC_OR_DEPARTMENT_OTHER): Payer: Self-pay | Admitting: Emergency Medicine

## 2015-03-19 ENCOUNTER — Telehealth (HOSPITAL_COMMUNITY): Payer: Self-pay

## 2015-03-19 NOTE — Telephone Encounter (Signed)
Unable to contact pt by mail or telephone. Unable to communicate lab results or treatment changes.

## 2015-07-08 ENCOUNTER — Encounter (HOSPITAL_COMMUNITY): Payer: Self-pay | Admitting: Emergency Medicine

## 2015-07-08 ENCOUNTER — Emergency Department (HOSPITAL_COMMUNITY)
Admission: EM | Admit: 2015-07-08 | Discharge: 2015-07-08 | Disposition: A | Discharge status: Home / Self Care | Payer: Medicaid Other | Attending: Emergency Medicine | Admitting: Emergency Medicine

## 2015-07-08 DIAGNOSIS — Z8742 Personal history of other diseases of the female genital tract: Secondary | ICD-10-CM | POA: Diagnosis not present

## 2015-07-08 DIAGNOSIS — F909 Attention-deficit hyperactivity disorder, unspecified type: Secondary | ICD-10-CM | POA: Diagnosis not present

## 2015-07-08 DIAGNOSIS — L03114 Cellulitis of left upper limb: Secondary | ICD-10-CM | POA: Insufficient documentation

## 2015-07-08 DIAGNOSIS — I10 Essential (primary) hypertension: Secondary | ICD-10-CM | POA: Insufficient documentation

## 2015-07-08 DIAGNOSIS — L0390 Cellulitis, unspecified: Secondary | ICD-10-CM

## 2015-07-08 DIAGNOSIS — L02414 Cutaneous abscess of left upper limb: Secondary | ICD-10-CM | POA: Diagnosis not present

## 2015-07-08 DIAGNOSIS — R112 Nausea with vomiting, unspecified: Secondary | ICD-10-CM | POA: Diagnosis not present

## 2015-07-08 DIAGNOSIS — Z79899 Other long term (current) drug therapy: Secondary | ICD-10-CM | POA: Insufficient documentation

## 2015-07-08 DIAGNOSIS — L0291 Cutaneous abscess, unspecified: Secondary | ICD-10-CM

## 2015-07-08 DIAGNOSIS — F17210 Nicotine dependence, cigarettes, uncomplicated: Secondary | ICD-10-CM | POA: Insufficient documentation

## 2015-07-08 LAB — CBC WITH DIFFERENTIAL/PLATELET
BASOS PCT: 0 %
Basophils Absolute: 0 10*3/uL (ref 0.0–0.1)
Eosinophils Absolute: 0 10*3/uL (ref 0.0–0.7)
Eosinophils Relative: 0 %
HEMATOCRIT: 48.3 % — AB (ref 36.0–46.0)
HEMOGLOBIN: 16.3 g/dL — AB (ref 12.0–15.0)
LYMPHS ABS: 3.1 10*3/uL (ref 0.7–4.0)
Lymphocytes Relative: 23 %
MCH: 32.9 pg (ref 26.0–34.0)
MCHC: 33.7 g/dL (ref 30.0–36.0)
MCV: 97.4 fL (ref 78.0–100.0)
MONOS PCT: 9 %
Monocytes Absolute: 1.2 10*3/uL — ABNORMAL HIGH (ref 0.1–1.0)
NEUTROS ABS: 9.1 10*3/uL — AB (ref 1.7–7.7)
NEUTROS PCT: 68 %
Platelets: 398 10*3/uL (ref 150–400)
RBC: 4.96 MIL/uL (ref 3.87–5.11)
RDW: 11.7 % (ref 11.5–15.5)
WBC: 13.5 10*3/uL — AB (ref 4.0–10.5)

## 2015-07-08 LAB — BASIC METABOLIC PANEL
Anion gap: 13 (ref 5–15)
BUN: 10 mg/dL (ref 6–20)
CHLORIDE: 105 mmol/L (ref 101–111)
CO2: 23 mmol/L (ref 22–32)
CREATININE: 0.93 mg/dL (ref 0.44–1.00)
Calcium: 10 mg/dL (ref 8.9–10.3)
GFR calc non Af Amer: >60 mL/min (ref >60)
Glucose, Bld: 102 mg/dL — ABNORMAL HIGH (ref 65–99)
POTASSIUM: 3.8 mmol/L (ref 3.5–5.1)
Sodium: 141 mmol/L (ref 135–145)

## 2015-07-08 MED ORDER — VANCOMYCIN HCL IN DEXTROSE 1-5 GM/200ML-% IV SOLN
1000.0000 mg | Freq: Once | INTRAVENOUS | Status: AC
  Administered 2015-07-08: 1000 mg via INTRAVENOUS
  Filled 2015-07-08: qty 200

## 2015-07-08 MED ORDER — IBUPROFEN 600 MG PO TABS: 600.0000 mg | ORAL_TABLET | Freq: Four times a day (QID) | ORAL | Status: DC | PRN

## 2015-07-08 MED ORDER — ACETAMINOPHEN 325 MG PO TABS
650.0000 mg | ORAL_TABLET | Freq: Once | ORAL | Status: AC
  Administered 2015-07-08: 650 mg via ORAL
  Filled 2015-07-08: qty 2

## 2015-07-08 MED ORDER — LIDOCAINE-EPINEPHRINE (PF) 2 %-1:200000 IJ SOLN
INTRAMUSCULAR | Status: AC
  Filled 2015-07-08: qty 20

## 2015-07-08 MED ORDER — SULFAMETHOXAZOLE-TRIMETHOPRIM 800-160 MG PO TABS: 1.0000 | ORAL_TABLET | Freq: Two times a day (BID) | ORAL | Status: DC

## 2015-07-08 MED ORDER — OXYCODONE-ACETAMINOPHEN 10-325 MG PO TABS: 1.0000 | ORAL_TABLET | ORAL | Status: DC | PRN

## 2015-07-08 MED ORDER — SULFAMETHOXAZOLE-TRIMETHOPRIM 800-160 MG PO TABS
1.0000 | ORAL_TABLET | Freq: Once | ORAL | Status: AC
  Administered 2015-07-08: 1 via ORAL
  Filled 2015-07-08: qty 1

## 2015-07-08 MED ORDER — ONDANSETRON HCL 4 MG/2ML IJ SOLN
4.0000 mg | Freq: Once | INTRAMUSCULAR | Status: AC
  Administered 2015-07-08: 4 mg via INTRAVENOUS
  Filled 2015-07-08: qty 2

## 2015-07-08 NOTE — ED Notes (Signed)
Pt c/o Abscess on L arm x 3 weeks that has gotten increasingly larger and more painful. Pt was arrested three weeks ago and right before being arrested she "shot up methamphetamines." Pt sts she missed the vein and has had a growing abscess ever since. A&Ox4 and ambulatory. Pt came straight from prison.

## 2015-07-08 NOTE — Discharge Instructions (Addendum)
Remove packing on Sunday, and begin twice daily cleansing of wound with soap and running water, placing a small dab of antibiotic ointment inside the open wound, then covering with dry gauze bandage.   Abscess An abscess is an infected area that contains a collection of pus and debris.It can occur in almost any part of the body. An abscess is also known as a furuncle or boil. CAUSES  An abscess occurs when tissue gets infected. This can occur from blockage of oil or sweat glands, infection of hair follicles, or a minor injury to the skin. As the body tries to fight the infection, pus collects in the area and creates pressure under the skin. This pressure causes pain. People with weakened immune systems have difficulty fighting infections and get certain abscesses more often.  SYMPTOMS Usually an abscess develops on the skin and becomes a painful mass that is red, warm, and tender. If the abscess forms under the skin, you may feel a moveable soft area under the skin. Some abscesses break open (rupture) on their own, but most will continue to get worse without care. The infection can spread deeper into the body and eventually into the bloodstream, causing you to feel ill.  DIAGNOSIS  Your caregiver will take your medical history and perform a physical exam. A sample of fluid may also be taken from the abscess to determine what is causing your infection. TREATMENT  Your caregiver may prescribe antibiotic medicines to fight the infection. However, taking antibiotics alone usually does not cure an abscess. Your caregiver may need to make a small cut (incision) in the abscess to drain the pus. In some cases, gauze is packed into the abscess to reduce pain and to continue draining the area. HOME CARE INSTRUCTIONS   Only take over-the-counter or prescription medicines for pain, discomfort, or fever as directed by your caregiver.  If you were prescribed antibiotics, take them as directed. Finish them even  if you start to feel better.  If gauze is used, follow your caregiver's directions for changing the gauze.  To avoid spreading the infection:  Keep your draining abscess covered with a bandage.  Wash your hands well.  Do not share personal care items, towels, or whirlpools with others.  Avoid skin contact with others.  Keep your skin and clothes clean around the abscess.  Keep all follow-up appointments as directed by your caregiver. SEEK MEDICAL CARE IF:   You have increased pain, swelling, redness, fluid drainage, or bleeding.  You have muscle aches, chills, or a general ill feeling.  You have a fever. MAKE SURE YOU:   Understand these instructions.  Will watch your condition.  Will get help right away if you are not doing well or get worse.   This information is not intended to replace advice given to you by your health care provider. Make sure you discuss any questions you have with your health care provider.   Document Released: 02/07/2005 Document Revised: 10/30/2011 Document Reviewed: 07/13/2011 Elsevier Interactive Patient Education 2016 Elsevier Inc.  Cellulitis Cellulitis is an infection of the skin and the tissue beneath it. The infected area is usually red and tender. Cellulitis occurs most often in the arms and lower legs.  CAUSES  Cellulitis is caused by bacteria that enter the skin through cracks or cuts in the skin. The most common types of bacteria that cause cellulitis are staphylococci and streptococci. SIGNS AND SYMPTOMS   Redness and warmth.  Swelling.  Tenderness or pain.  Fever.  DIAGNOSIS  Your health care provider can usually determine what is wrong based on a physical exam. Blood tests may also be done. TREATMENT  Treatment usually involves taking an antibiotic medicine. HOME CARE INSTRUCTIONS   Take your antibiotic medicine as directed by your health care provider. Finish the antibiotic even if you start to feel better.  Keep the  infected arm or leg elevated to reduce swelling.  Apply a warm cloth to the affected area up to 4 times per day to relieve pain.  Take medicines only as directed by your health care provider.  Keep all follow-up visits as directed by your health care provider. SEEK MEDICAL CARE IF:   You notice red streaks coming from the infected area.  Your red area gets larger or turns dark in color.  Your bone or joint underneath the infected area becomes painful after the skin has healed.  Your infection returns in the same area or another area.  You notice a swollen bump in the infected area.  You develop new symptoms.  You have a fever. SEEK IMMEDIATE MEDICAL CARE IF:   You feel very sleepy.  You develop vomiting or diarrhea.  You have a general ill feeling (malaise) with muscle aches and pains.   This information is not intended to replace advice given to you by your health care provider. Make sure you discuss any questions you have with your health care provider.   Document Released: 02/07/2005 Document Revised: 01/19/2015 Document Reviewed: 07/16/2011 Elsevier Interactive Patient Education 2016 [employer].  Emergency Department Resource Guide 1) Find a Doctor and Pay Out of Pocket Although you won't have to find out who is covered by your insurance plan, it is a good idea to ask around and get recommendations. You will then need to call the office and see if the doctor you have chosen will accept you as a new patient and what types of options they offer for patients who are self-pay. Some doctors offer discounts or will set up payment plans for their patients who do not have insurance, but you will need to ask so you aren't surprised when you get to your appointment.  2) Contact Your Local Health Department Not all health departments have doctors that can see patients for sick visits, but many do, so it is worth a call to see if yours does. If you don't know where your local  health department is, you can check in your phone book. The CDC also has a tool to help you locate your state's health department, and many state websites also have listings of all of their local health departments.  3) Find a [facility] If your illness is not likely to be very severe or complicated, you may want to try a walk in clinic. These are popping up all over the country in pharmacies, drugstores, and shopping centers. They're usually staffed by nurse practitioners or physician assistants that have been trained to treat common illnesses and complaints. They're usually fairly quick and inexpensive. However, if you have serious medical issues or chronic medical problems, these are probably not your best option.  No Primary Care Doctor: - Call Health Connect at  [phone] - they can help you locate a primary care doctor that  accepts your insurance, provides certain services, etc. - Physician Referral Service- [phone]  Chronic Pain Problems: Organization         Address  Phone   Notes  [facility]  [phone] Patients  need to be referred by their primary care doctor.   Medication Assistance: Organization         Address  Phone   Notes  [facility] Medication [facility] [address] [place], [place] [address] [phone] --Must be a resident of [facility] -- Must have NO insurance coverage whatsoever (no Medicaid/ Medicare, etc.) -- The pt. MUST have a primary care doctor that directs their care regularly and follows them in the community   [facility]ist  [phone]   [employer]  [phone]    Agencies that provide inexpensive medical care: Organization         Address  Phone   Notes  [place]  [phone]   [facility]    [phone]   [facility], [place] [address] [phone]   [place] [address], [place] [phone]   Planned Parenthood    [phone]   [facility]    [phone]   [place] [facility] [place] [address], [place] Phone:  [phone], Fax:  [phone] Hours of Operation:  9 am - 6 pm, M-F.  Also accepts Medicaid/Medicare and self-pay.  [facility] for [place] [address], [address], [place] Phone: [phone], Fax: [phone]. Hours of Operation:  8:30 am - 5:30 pm, M-F.  Also accepts Medicaid and self-pay.  [facility] [address] [place] Phone: [phone]   [place], [place], [place] [phone], Ext. [phone] Mondays & Thursdays: 7-9 AM.  First 15 patients are seen on a first come, first serve basis.    [place] Providers:  Organization         Address  Phone   Notes  [facility] [address], [place] [phone] Also accepts self-pay patients.  [facility] [address], [place]  [phone]   [place], [address], [place] [phone]   [facility] [address] [place] [phone]   Lucianne Lei [address], [place]   [phone] Only accepts [place] Access [place] patients after they have their name applied to their card.   Self-Pay (no insurance) in [facility]:  Organization         Address  Phone   Notes  Sickle Cell Patients, [facility] Internal Medicine [place] [phone]   [facility] Urgent Care [place] [phone]   [facility] [place]  [place], [place], [place] [phone]   [facility]/Dr. Osei-Bonsu  [address] [place] or [address], [place] [phone] Phone number for both [place] and  [place] locations is the same.  Urgent Medical and [facility] [address], [place] [phone]   [facility] [address] [place] or [address] [phone]   [facility] [address] [place] [phone], phone; [phone], fax Sees patients 1st and 3rd Saturday of every month.  Must not qualify for public or private insurance (i.e. Medicaid, Medicare, [place] Health Choice, Veterans' Benefits)  Household income  should be no more than 200% of the poverty level The clinic cannot treat you if you are pregnant or think you are pregnant  Sexually transmitted diseases are not treated at the clinic.    Dental Care: Organization         Address  Phone  Notes  [facility] [place] [phone] Accepts children up to age 75 who are enrolled in [place] or [place]; pregnant women with a Medicaid card; and children who have applied for Medicaid or [place] Health Choice, but were declined, whose parents can pay a reduced fee at time of service.  [facility]  [address], [place] [phone] Accepts children up to age 50 who are enrolled in [place] or [place]; pregnant women with a Medicaid card; and children who have applied for Medicaid or [place] Health Choice, but were declined, whose parents can pay a reduced fee at time of service.  [place] Adult Dental Access PROGRAM  [place] [phone] Patients are seen by appointment only. Walk-ins are not accepted. [place] will see patients 21 years of age and older. Monday - Tuesday (8am-5pm) Most Wednesdays (8:30-5pm) $30 per visit, cash only  [facility] Adult Dental Access PROGRAM  [address], [facility] [phone] Patients are seen by appointment only. Walk-ins are not accepted.  [place] will see patients 37 years of age and older. One Wednesday Evening (Monthly: Volunteer Based).  $30 per visit, cash only  [place]  [phone] for adults; Children under age 1, call Graduate Pediatric Dentistry at [phone]. Children aged 24-14, please call [phone] to request a pediatric application.  Dental services are provided in all areas of dental care including fillings, crowns and bridges, complete and partial dentures, implants, gum treatment, root canals, and extractions. Preventive care is also provided. Treatment is provided to both adults and children. Patients are selected via a lottery and there is often a waiting list.   [name] [address] [place]  [phone] [email or web address]   [facility] [address] [place], [place] [phone], Ext. [phone] Second and Fourth Thursday of each month, opens at 6:30 AM; Clinic ends at 9 AM.  Patients are seen on a first-come first-served basis, and a limited number are seen during each clinic.   [facility]  [address] Hillard Danker [place], [place] [phone]   Eligibility Requirements You must have lived in [place], [place], or [place] counties for at least the last three months.   You cannot be eligible for state or federal sponsored [employer], including [employer], [place], or [employer].   You generally cannot be eligible for healthcare insurance through your employer.    How to apply: Eligibility screenings are held every Tuesday and Wednesday afternoon from 1:00 pm until 4:00 pm. You do not need an appointment for the interview!  [facility] [address] [place], [place]   [place]  [place] Department  [place]  [phone]    Behavioral Health Resources in the  Community: Intensive Outpatient Programs Organization         Address  Phone  Notes  [place]. [address] [place], [place]  [facility] Outpatient [address], [place], [place]   ADS: Alcohol & Drug Svcs [address] [place], [place]   [place] [address]  [place], [place] or [phone]   Substance Abuse Resources Organization         Address  Phone  Notes  Alcohol and Drug Services  [phone]   [place]  [phone]   [facility] [place]   Chinita Pester  [phone]   Residential & Outpatient Substance Abuse Program  6016322599   Psychological Services Organization         Address  Phone  Notes  [facility]  [place]  [phone]   [place] [address], [place] or [phone]    Mobile Crisis Teams Organization         Address  Phone  Notes  [facility], Mobile Crisis Care Unit  [phone]   Assertive Psychotherapeutic Services  [address] [place], [place]   [name] Levels [address], [place] [phone]    Self-Help/Support Groups Organization         Address  Phone             Notes  [place]. of [place] - variety of support groups  [place] Call for more information  Narcotics Anonymous (NA), [facility] [address], [employer] [place]  2 meetings at this location   Special educational needs teacher         Address  Phone  Notes  ASAP Residential Treatment [place],    [place]  [phone]   [name]  [address] [place] [address]07, [place], [place]   [place], [place] [phone] Admissions: 8am-3pm M-F  Incentives Substance [place] [address]    [place], [place] [phone]   [facility] [address] [place], [place], [place]   [facility] [address],  [place], [place]   [facility] Programs - Intensive Outpatient [address] Kristeen [address], [place], [place]   [name] ([place].) [place].,  [place], [place] [phone] or [phone]   [facility]idential Treatment Services (RTS) [address] [place], [place] Accepts Medicaid  [facility] [place] [address]  [place] [phone] Substance Abuse/Addiction Treatment   [name] Organization         Address  Phone  Notes  [facility]  [phone]   Domenic Schwab, PhD [address] Arlis [place], [place]   [phone] or [phone]   [place], [place] [phone]   [facility] [address] [place], [place] [phone] Insurance/Medicaid/sponsorship through [facility] and Families [address] [place]                                    [place], [place] [phone] [place] [address], [place] [phone]    Dr. Adele Schilder  [phone]   [facility] Dept. 1) [address], [place] 2) [place] 3)  [place], [place] [phone]  [phone]   [facility] Child Abuse Hotline 604-544-7534 or [phone] (After Hours)

## 2015-07-08 NOTE — ED Provider Notes (Signed)
CSN: [id number]     Arrival date & time 07/08/15  1235 History   First MD Initiated Contact with Patient 07/08/15 1245     Chief Complaint  Patient presents with  . Abscess    Kelly [name] is a 30 y.o. female who is right hand dominant with a history of IV drug abuse who presents to the ED complaining of a left arm abscess for about 3 weeks. She reports she is been shooting up methamphetamine and missed the vein recently. She reports this is gradually worsening over the past 3 weeks. She was released from prison today and came to the ED.  She reports subjective fevers yesterday. She also reports feeling nauseated and having one episode of vomiting today. She reports she's been drinking ginger ale today without vomiting. She denies abdominal pain. She denies hand pain. She denies difficultly moving her hand. She reports 10 out of 10 pain to her left forearm abscess. She denies numbness, tingling, weakness, abdominal pain, diarrhea, chest pain or syncope.  Last had ginger ale around 1 pm. Last ate yesterday.    Patient is a 30 y.o. female presenting with abscess. The history is provided by the patient. No language interpreter was used.  Abscess Associated symptoms: fever (subjective ), nausea and vomiting   Associated symptoms: no headaches     Past Medical History  Diagnosis Date  . Hypertension   . ADD (attention deficit disorder)   . Bipolar 1 disorder ([place])   . Polysubstance abuse   . Suicidal overdose ([place])   . Abscess of breast     rt breast  . Back pain    Past Surgical History  Procedure Laterality Date  . Cesarean section    . Back surgery      scoliosis  . Cholecystectomy  March 2015  . Irrigation and debridement abscess Right 07/13/2014    Procedure: IRRIGATION AND DEBRIDEMEN TRIGHT BREAST ABSCESS;  Surgeon: Donnie Mesa, MD;  Location: [facility];  Service: General;  Laterality: Right;   Family History  Problem Relation Age of Onset  . Diabetes Mother   . Diabetes  Father   . Hypertension Father   . Heart attack Neg Hx   . Hyperlipidemia Neg Hx   . Sudden death Neg Hx    Social History  Substance Use Topics  . Smoking status: Current Every Day Smoker -- 1.00 packs/day for 13 years    Types: Cigarettes  . Smokeless tobacco: Never Used  . Alcohol Use: Yes     Comment: one or two drinks weekly   OB History    No data available     Review of Systems  Constitutional: Positive for fever (subjective ). Negative for chills.  HENT: Negative for congestion and sore throat.   Eyes: Negative for visual disturbance.  Respiratory: Negative for cough and shortness of breath.   Cardiovascular: Negative for chest pain.  Gastrointestinal: Positive for nausea and vomiting. Negative for abdominal pain and diarrhea.  Genitourinary: Negative for dysuria.  Musculoskeletal: Positive for myalgias. Negative for back pain and neck pain.  Skin: Positive for color change and rash.  Neurological: Negative for syncope and headaches.      Allergies  Hydrocodone and Morphine and related  Home Medications   Prior to Admission medications   Medication Sig Start Date End Date Taking? Authorizing Provider  amphetamine-dextroamphetamine (ADDERALL) 20 MG tablet Take 20 mg by mouth 2 (two) times daily.   Yes Historical Provider, MD  methadone (DOLOPHINE) 10  MG tablet Take 10 mg by mouth daily.   Yes Historical Provider, MD  acetaminophen (TYLENOL) 325 MG tablet Take 2 tablets (650 mg total) by mouth every 6 (six) hours as needed for mild pain (or Temp > 100). Patient not taking: Reported on 07/08/2015 07/15/14   Erby Pian, NP  ibuprofen (ADVIL,MOTRIN) 600 MG tablet Take 1 tablet (600 mg total) by mouth every 6 (six) hours as needed. 07/08/15   Milly Jakob, MD  oxyCODONE-acetaminophen (PERCOCET) 10-325 MG tablet Take 1 tablet by mouth every 4 (four) hours as needed (severe pain). 07/08/15   Milly Jakob, MD  sulfamethoxazole-trimethoprim (BACTRIM DS,SEPTRA DS) 800-160  MG tablet Take 1 tablet by mouth 2 (two) times daily. 07/08/15   Milly Jakob, MD   BP 134/94 mmHg  Pulse 84  Temp(Src) 98.4 F (36.9 C) (Oral)  Resp 16  SpO2 100% Physical Exam  Constitutional: She is oriented to person, place, and time. She appears well-developed and well-nourished. No distress.  Nontoxic appearing.  HENT:  Head: Normocephalic and atraumatic.  Mouth/Throat: Oropharynx is clear and moist.  Eyes: Conjunctivae are normal. Pupils are equal, round, and reactive to light. Right eye exhibits no discharge. Left eye exhibits no discharge.  Neck: Neck supple.  Cardiovascular: Normal rate, regular rhythm, normal heart sounds and intact distal pulses.  Exam reveals no gallop and no friction rub.   No murmur heard. Bilateral radial pulses are intact. Good capillary refill to her left distal fingertips.  Pulmonary/Chest: Effort normal and breath sounds normal. No respiratory distress. She has no wheezes. She has no rales.  Abdominal: Soft. Bowel sounds are normal. There is no tenderness. There is no guarding.  Abdomen is soft and nontender to palpation.  Musculoskeletal: Normal range of motion. She exhibits edema and tenderness.  Patient is a large 7 cm fluctuant abscess with 10 cm surrounding induration to her left forearm. There is surrounding erythema consistent with cellulitis. Patient has good range of motion and grip strength of her left hand. She is neurovascularly intact.  Lymphadenopathy:    She has no cervical adenopathy.  Neurological: She is alert and oriented to person, place, and time. Coordination normal.  Sensation intact to her bilateral distal fingertips. She is alert and oriented x3.   Skin: Skin is warm and dry. No rash noted. She is not diaphoretic. There is erythema. No pallor.  See musculoskeletal.   Psychiatric: She has a normal mood and affect. Her behavior is normal.  Nursing note and vitals reviewed.       ED Course  Procedures (including  critical care time) Labs Review Labs Reviewed  CBC WITH DIFFERENTIAL/PLATELET - Abnormal; Notable for the following:    WBC 13.5 (*)    Hemoglobin 16.3 (*)    HCT 48.3 (*)    Neutro Abs 9.1 (*)    Monocytes Absolute 1.2 (*)    All other components within normal limits  BASIC METABOLIC PANEL - Abnormal; Notable for the following:    Glucose, Bld 102 (*)    All other components within normal limits  CULTURE, ROUTINE-ABSCESS    Imaging Review No results found.    EKG Interpretation None      Filed Vitals:   07/08/15 1259 07/08/15 1554  BP: 111/62 134/94  Pulse: 95 84  Temp: 98.4 F (36.9 C)   TempSrc: Oral   Resp: 16 16  SpO2: 96% 100%     MDM   Meds given in ED:  Medications  lidocaine-EPINEPHrine (XYLOCAINE W/EPI) 2 %-1:200000 (  PF) injection (not administered)  vancomycin (VANCOCIN) IVPB 1000 mg/200 mL premix (1,000 mg Intravenous New Bag/Given 07/08/15 1722)  sulfamethoxazole-trimethoprim (BACTRIM DS,SEPTRA DS) 800-160 MG per tablet 1 tablet (1 tablet Oral Given 07/08/15 1822)  ondansetron (ZOFRAN) injection 4 mg (4 mg Intravenous Given 07/08/15 1819)  acetaminophen (TYLENOL) tablet 650 mg (650 mg Oral Given 07/08/15 1819)    Current Discharge Medication List    START taking these medications   Details  ibuprofen (ADVIL,MOTRIN) 600 MG tablet Take 1 tablet (600 mg total) by mouth every 6 (six) hours as needed. Qty: 60 tablet, Refills: 0    sulfamethoxazole-trimethoprim (BACTRIM DS,SEPTRA DS) 800-160 MG tablet Take 1 tablet by mouth 2 (two) times daily. Qty: 20 tablet, Refills: 1        Final diagnoses:  Cellulitis and abscess   This is a 30 y.o. female who is right hand dominant with a history of IV drug abuse who presents to the ED complaining of a left arm abscess for about 3 weeks. She reports she is been shooting up methamphetamine and missed the vein recently. She reports this is gradually worsening over the past 3 weeks. She was released from prison  today and came to the ED.  She reports subjective fevers yesterday. She also reports feeling nauseated and having one episode of vomiting today. She reports she's been drinking ginger ale today without vomiting. She denies abdominal pain. She denies hand pain. She denies difficultly moving her hand.  On exam patient is afebrile nontoxic appearing. She has a large 7 cm fluctuant abscess to her volar aspect of her left forearm with 10 cm area of surrounding induration and cellulitis. Patient has pain with manipulation of her hand. She has no difficulty moving her left hand. She is neurovascularly intact. Patient kept nothing by mouth and started on vancomycin, per request of Dr. Zenia Resides.  I consulted with Dr. Zenia Resides and he agrees with consult to hand surgery for large abscess.  I consulted with orthopedic hand surgeon Grandville Silos who will be by to see the patient. Patient's CBC returned with a leukocytosis with a white count of 13,000. BMP is unremarkable. Dr. Grandville Silos performed incision and drainage at bedside. He provided with discharge instructions and prescriptions for ibuprofen, Percocet and Bactrim. I reevaluated the patient reports she is doing better. She did have one episode of vomiting after the procedure. She reports feeling much better. She's been tolerating by mouth. Will discharge with strict return precautions. I also encouraged her to follow-up with Dr. Grandville Silos as he discussed. I advised the patient to follow-up with their primary care provider this week. I advised the patient to return to the emergency department with new or worsening symptoms or new concerns. The patient verbalized understanding and agreement with plan.    This patient was discussed with and evaluated by Dr. Zenia Resides who agrees with assessment and plan.   [name], PA-C 07/08/15 [phone]

## 2015-07-08 NOTE — ED Provider Notes (Signed)
Medical screening examination/treatment/procedure(s) were conducted as a shared visit with non-physician practitioner(s) and myself.  I personally evaluated the patient during the encounter.  Patient here with left forearm abscess 3 weeks. Has evidence of abscess due to prior heroin abuse in this area. Hand surgery to see  Lacretia Leigh, MD 07/08/15 1723

## 2015-07-08 NOTE — Consult Note (Signed)
ORTHOPAEDIC CONSULTATION HISTORY & PHYSICAL REQUESTING PHYSICIAN: Lacretia Leigh, MD  Chief Complaint: left forearm abscess  HPI: Kelly [name] is a 30 y.o. female who presented with a 3 to four-day history developing abscess on the left forearm. She reports previously using injected drugs. She had a right breast abscess with MRSA early in 2016.  Past Medical History  Diagnosis Date  . Hypertension   . ADD (attention deficit disorder)   . Bipolar 1 disorder ([place])   . Polysubstance abuse   . Suicidal overdose ([place])   . Abscess of breast     rt breast  . Back pain    Past Surgical History  Procedure Laterality Date  . Cesarean section    . Back surgery      scoliosis  . Cholecystectomy  March 2015  . Irrigation and debridement abscess Right 07/13/2014    Procedure: IRRIGATION AND DEBRIDEMEN TRIGHT BREAST ABSCESS;  Surgeon: Donnie Mesa, MD;  Location: [facility];  Service: General;  Laterality: Right;   Social History   Social History  . Marital Status: Single    Spouse Name: N/A  . Number of Children: N/A  . Years of Education: N/A   Social History Main Topics  . Smoking status: Current Every Day Smoker -- 1.00 packs/day for 13 years    Types: Cigarettes  . Smokeless tobacco: Never Used  . Alcohol Use: Yes     Comment: one or two drinks weekly  . Drug Use: Yes    Special: Cocaine, Heroin     Comment: heroin, crack cocaine  . Sexual Activity: Yes    Birth Control/ Protection: Condom   Other Topics Concern  . None   Social History Narrative   Family History  Problem Relation Age of Onset  . Diabetes Mother   . Diabetes Father   . Hypertension Father   . Heart attack Neg Hx   . Hyperlipidemia Neg Hx   . Sudden death Neg Hx    Allergies  Allergen Reactions  . Hydrocodone Itching  . Morphine And Related Nausea And Vomiting   Prior to Admission medications   Medication Sig Start Date End Date Taking? Authorizing Provider  amphetamine-dextroamphetamine  (ADDERALL) 20 MG tablet Take 20 mg by mouth 2 (two) times daily.   Yes Historical Provider, MD  methadone (DOLOPHINE) 10 MG tablet Take 10 mg by mouth daily.   Yes Historical Provider, MD  acetaminophen (TYLENOL) 325 MG tablet Take 2 tablets (650 mg total) by mouth every 6 (six) hours as needed for mild pain (or Temp > 100). Patient not taking: Reported on 07/08/2015 07/15/14   Erby Pian, NP  ibuprofen (ADVIL,MOTRIN) 600 MG tablet Take 1 tablet (600 mg total) by mouth every 6 (six) hours as needed. 07/08/15   Milly Jakob, MD  oxyCODONE-acetaminophen (PERCOCET) 10-325 MG tablet Take 1 tablet by mouth every 4 (four) hours as needed (severe pain). 07/08/15   Milly Jakob, MD  sulfamethoxazole-trimethoprim (BACTRIM DS,SEPTRA DS) 800-160 MG tablet Take 1 tablet by mouth 2 (two) times daily. 07/08/15   Milly Jakob, MD   No results found.  Positive ROS: All other systems have been reviewed and were otherwise negative with the exception of those mentioned in the HPI and as above.  Physical Exam: Vitals: Refer to EMR. Constitutional:  WD, WN, NAD HEENT:  NCAT, EOMI Neuro/Psych:  Alert & oriented to person, place, and time; appropriate mood & affect Lymphatic: No generalized extremity edema or lymphadenopathy Extremities / MSK:  The extremities  are normal with respect to appearance, ranges of motion, joint stability, muscle strength/tone, sensation, & perfusion except as otherwise noted:  Left forearm with an abscess measuring 3-4 cm in diameter, 1-2 cm in depth, with surrounding erythematous induration of 10-12 cm diameter. No significant lymphangitic streaking. MVI.  Assessment: Left forearm subcutaneous abscess  Plan: I provided local anesthetic infiltration with epinephrine about the abscess, and painlessly created a 1-1/2 inch incision. Material was obtained and cultures were obtained. There was some degree of foul-smelling pus it emanated from the wound. The interior of the cavity  appeared to be completely subcutaneous, and there was no extension that could be ascertained with probing. It was copiously irrigated and packing left. A light dressing was applied. She will be discharged with analgesics, anti-inflammatories, and Bactrim. She received a dose of IV vancomycin and Bactrim in the emergency department. She was instructed in wound care, should follow-up in 7-10 days for reevaluation.  Rayvon Char Grandville Silos, [address], [place]  [id number] Office: [phone] Mobile: [phone]

## 2015-07-11 LAB — CULTURE, ROUTINE-ABSCESS
Culture: NO GROWTH
Special Requests: NORMAL

## 2016-05-14 NOTE — L&D Delivery Note (Signed)
Vaginal Delivery Note  31 y.o. [id number] at 40w3ddelivered a viable female infant at 003:47in cephalic, LOA position. No nuchal cord. Right anterior shoulder delivered with ease. Moderate meconium appreciated. Sixty sec delayed cord clamping. Cord clamped x2 and cut. Placenta delivered spontaneously intact, with 3VC. Fundus firm on exam with massage and pitocin.  Anesthesia: None Laceration: 1st degree labial x2 Suture repair: None EBL: 200 mL  Baby: Apgars: 9, 9 Weight: Pending Cord pH: Not sent  Good hemostasis noted. Mom to postpartum.  Baby to Couplet care / Skin to Skin.  DRandolph Bing DO, PGY-1 07/26/2016, 2:01 AM   Patient is a [id number] 360w3dho was admitted in [facility], significant hx of polysubstance use during preg, prev C/S, no prenatal care.  She progressed without augmentation.  I was gloved and present for delivery in its entirety.  Second stage of labor progressed, baby delivered after approx 30 mins of pushing.  Mild decels during second stage noted.  Complications: none  Lacerations: labial abrasions, not repaired  EBL: 200cc  Peds present at delivery.  SHSerita GrammesCNM 2:50 AM  07/26/2016

## 2016-05-24 ENCOUNTER — Inpatient Hospital Stay (HOSPITAL_COMMUNITY)
Admission: EM | Admit: 2016-05-24 | Discharge: 2016-05-29 | DRG: 781 | Discharge status: Against Medical Advice | Payer: Medicaid Other | Attending: Obstetrics and Gynecology | Admitting: Obstetrics and Gynecology

## 2016-05-24 ENCOUNTER — Emergency Department (HOSPITAL_COMMUNITY): Payer: Medicaid Other

## 2016-05-24 ENCOUNTER — Inpatient Hospital Stay (HOSPITAL_COMMUNITY): Payer: Medicaid Other

## 2016-05-24 ENCOUNTER — Encounter (HOSPITAL_COMMUNITY): Payer: Self-pay

## 2016-05-24 DIAGNOSIS — J181 Lobar pneumonia, unspecified organism: Secondary | ICD-10-CM

## 2016-05-24 DIAGNOSIS — O26893 Other specified pregnancy related conditions, third trimester: Secondary | ICD-10-CM | POA: Diagnosis present

## 2016-05-24 DIAGNOSIS — S60851A Superficial foreign body of right wrist, initial encounter: Secondary | ICD-10-CM

## 2016-05-24 DIAGNOSIS — O34211 Maternal care for low transverse scar from previous cesarean delivery: Secondary | ICD-10-CM | POA: Diagnosis present

## 2016-05-24 DIAGNOSIS — M60221 Foreign body granuloma of soft tissue, not elsewhere classified, right upper arm: Secondary | ICD-10-CM | POA: Diagnosis not present

## 2016-05-24 DIAGNOSIS — Z3A29 29 weeks gestation of pregnancy: Secondary | ICD-10-CM

## 2016-05-24 DIAGNOSIS — F1123 Opioid dependence with withdrawal: Secondary | ICD-10-CM | POA: Diagnosis present

## 2016-05-24 DIAGNOSIS — Z3A30 30 weeks gestation of pregnancy: Secondary | ICD-10-CM | POA: Diagnosis not present

## 2016-05-24 DIAGNOSIS — O2300 Infections of kidney in pregnancy, unspecified trimester: Secondary | ICD-10-CM | POA: Diagnosis present

## 2016-05-24 DIAGNOSIS — F1910 Other psychoactive substance abuse, uncomplicated: Secondary | ICD-10-CM | POA: Diagnosis present

## 2016-05-24 DIAGNOSIS — Z5321 Procedure and treatment not carried out due to patient leaving prior to being seen by health care provider: Secondary | ICD-10-CM | POA: Diagnosis present

## 2016-05-24 DIAGNOSIS — O0930 Supervision of pregnancy with insufficient antenatal care, unspecified trimester: Secondary | ICD-10-CM

## 2016-05-24 DIAGNOSIS — R0602 Shortness of breath: Secondary | ICD-10-CM | POA: Diagnosis present

## 2016-05-24 DIAGNOSIS — F17210 Nicotine dependence, cigarettes, uncomplicated: Secondary | ICD-10-CM | POA: Diagnosis present

## 2016-05-24 DIAGNOSIS — F1410 Cocaine abuse, uncomplicated: Secondary | ICD-10-CM | POA: Diagnosis present

## 2016-05-24 DIAGNOSIS — O99323 Drug use complicating pregnancy, third trimester: Secondary | ICD-10-CM | POA: Diagnosis present

## 2016-05-24 DIAGNOSIS — O99333 Smoking (tobacco) complicating pregnancy, third trimester: Secondary | ICD-10-CM | POA: Diagnosis present

## 2016-05-24 DIAGNOSIS — O0933 Supervision of pregnancy with insufficient antenatal care, third trimester: Secondary | ICD-10-CM

## 2016-05-24 DIAGNOSIS — O2303 Infections of kidney in pregnancy, third trimester: Secondary | ICD-10-CM | POA: Diagnosis present

## 2016-05-24 DIAGNOSIS — S51841A Puncture wound with foreign body of right forearm, initial encounter: Secondary | ICD-10-CM | POA: Diagnosis present

## 2016-05-24 DIAGNOSIS — O0932 Supervision of pregnancy with insufficient antenatal care, second trimester: Secondary | ICD-10-CM

## 2016-05-24 DIAGNOSIS — J189 Pneumonia, unspecified organism: Secondary | ICD-10-CM | POA: Diagnosis present

## 2016-05-24 DIAGNOSIS — L03113 Cellulitis of right upper limb: Secondary | ICD-10-CM | POA: Diagnosis present

## 2016-05-24 DIAGNOSIS — Z3689 Encounter for other specified antenatal screening: Secondary | ICD-10-CM

## 2016-05-24 DIAGNOSIS — O99513 Diseases of the respiratory system complicating pregnancy, third trimester: Secondary | ICD-10-CM | POA: Diagnosis present

## 2016-05-24 DIAGNOSIS — M419 Scoliosis, unspecified: Secondary | ICD-10-CM | POA: Diagnosis present

## 2016-05-24 HISTORY — DX: Infections of kidney in pregnancy, unspecified trimester: O23.00

## 2016-05-24 LAB — BASIC METABOLIC PANEL
Anion gap: 12 (ref 5–15)
CHLORIDE: 99 mmol/L — AB (ref 101–111)
CO2: 22 mmol/L (ref 22–32)
CREATININE: 0.4 mg/dL — AB (ref 0.44–1.00)
Calcium: 9.1 mg/dL (ref 8.9–10.3)
GFR calc Af Amer: >60 mL/min (ref >60)
GFR calc non Af Amer: >60 mL/min (ref >60)
Glucose, Bld: 161 mg/dL — ABNORMAL HIGH (ref 65–99)
Potassium: 3.4 mmol/L — ABNORMAL LOW (ref 3.5–5.1)
SODIUM: 133 mmol/L — AB (ref 135–145)

## 2016-05-24 LAB — CBC
HEMATOCRIT: 31.5 % — AB (ref 36.0–46.0)
HEMATOCRIT: 26.7 % — AB (ref 36.0–46.0)
HEMOGLOBIN: 10.5 g/dL — AB (ref 12.0–15.0)
HEMOGLOBIN: 9.4 g/dL — AB (ref 12.0–15.0)
MCH: 30.1 pg (ref 26.0–34.0)
MCH: 31.3 pg (ref 26.0–34.0)
MCHC: 33.3 g/dL (ref 30.0–36.0)
MCHC: 35.2 g/dL (ref 30.0–36.0)
MCV: 90.3 fL (ref 78.0–100.0)
MCV: 89 fL (ref 78.0–100.0)
Platelets: 461 10*3/uL — ABNORMAL HIGH (ref 150–400)
Platelets: 453 10*3/uL — ABNORMAL HIGH (ref 150–400)
RBC: 3.49 MIL/uL — AB (ref 3.87–5.11)
RBC: 3 MIL/uL — ABNORMAL LOW (ref 3.87–5.11)
RDW: 14.6 % (ref 11.5–15.5)
RDW: 14.6 % (ref 11.5–15.5)
WBC: 17.1 10*3/uL — ABNORMAL HIGH (ref 4.0–10.5)
WBC: 13.5 10*3/uL — AB (ref 4.0–10.5)

## 2016-05-24 LAB — TYPE AND SCREEN
ABO/RH(D): O POS
Antibody Screen: NEGATIVE

## 2016-05-24 LAB — I-STAT BETA HCG BLOOD, ED (MC, WL, AP ONLY)

## 2016-05-24 LAB — RAPID URINE DRUG SCREEN, HOSP PERFORMED
Amphetamines: NOT DETECTED
BARBITURATES: NOT DETECTED
BENZODIAZEPINES: NOT DETECTED
Cocaine: POSITIVE — AB
Opiates: POSITIVE — AB
Tetrahydrocannabinol: NOT DETECTED

## 2016-05-24 LAB — URINALYSIS, ROUTINE W REFLEX MICROSCOPIC
BILIRUBIN URINE: NEGATIVE
Glucose, UA: 50 mg/dL — AB
Hgb urine dipstick: NEGATIVE
KETONES UR: 5 mg/dL — AB
Nitrite: NEGATIVE
PH: 6 (ref 5.0–8.0)
Protein, ur: NEGATIVE mg/dL
Specific Gravity, Urine: 1.016 (ref 1.005–1.030)

## 2016-05-24 LAB — DIFFERENTIAL
BASOS ABS: 0 10*3/uL (ref 0.0–0.1)
Basophils Relative: 0 %
Eosinophils Absolute: 0 10*3/uL (ref 0.0–0.7)
Eosinophils Relative: 0 %
LYMPHS ABS: 1.6 10*3/uL (ref 0.7–4.0)
LYMPHS PCT: 12 %
MONOS PCT: 2 %
Monocytes Absolute: 0.3 10*3/uL (ref 0.1–1.0)
NEUTROS ABS: 11.5 10*3/uL — AB (ref 1.7–7.7)
Neutrophils Relative %: 86 %

## 2016-05-24 LAB — HEPATITIS B SURFACE ANTIGEN: HEP B S AG: NEGATIVE

## 2016-05-24 MED ORDER — ACETAMINOPHEN 325 MG PO TABS
650.0000 mg | ORAL_TABLET | ORAL | Status: DC | PRN
  Administered 2016-05-25 – 2016-05-29 (×3): 650 mg via ORAL
  Filled 2016-05-24 (×3): qty 2

## 2016-05-24 MED ORDER — FENTANYL CITRATE (PF) 100 MCG/2ML IJ SOLN
100.0000 ug | Freq: Once | INTRAMUSCULAR | Status: AC
  Administered 2016-05-24: 100 ug via INTRAVENOUS
  Filled 2016-05-24: qty 2

## 2016-05-24 MED ORDER — DOCUSATE SODIUM 100 MG PO CAPS
100.0000 mg | ORAL_CAPSULE | Freq: Every day | ORAL | Status: DC
  Administered 2016-05-24 – 2016-05-29 (×4): 100 mg via ORAL
  Filled 2016-05-24 (×5): qty 1

## 2016-05-24 MED ORDER — PRENATAL MULTIVITAMIN CH
1.0000 | ORAL_TABLET | Freq: Every day | ORAL | Status: DC
  Administered 2016-05-24 – 2016-05-27 (×3): 1 via ORAL
  Filled 2016-05-24 (×4): qty 1

## 2016-05-24 MED ORDER — CLONIDINE HCL 0.1 MG PO TABS
0.1000 mg | ORAL_TABLET | Freq: Three times a day (TID) | ORAL | Status: DC
  Administered 2016-05-24: 0.1 mg via ORAL
  Filled 2016-05-24 (×4): qty 1

## 2016-05-24 MED ORDER — IOPAMIDOL (ISOVUE-370) INJECTION 76%
INTRAVENOUS | Status: AC
  Administered 2016-05-24: 100 mL
  Filled 2016-05-24: qty 100

## 2016-05-24 MED ORDER — MORPHINE SULFATE (PF) 4 MG/ML IV SOLN
6.0000 mg | Freq: Once | INTRAVENOUS | Status: AC
  Administered 2016-05-24: 6 mg via INTRAVENOUS
  Filled 2016-05-24: qty 2

## 2016-05-24 MED ORDER — ZOLPIDEM TARTRATE 5 MG PO TABS: 5.0000 mg | ORAL_TABLET | Freq: Every evening | ORAL | Status: DC | PRN

## 2016-05-24 MED ORDER — DEXTROSE 5 % IV SOLN
500.0000 mg | INTRAVENOUS | Status: DC
  Administered 2016-05-25 – 2016-05-28 (×4): 500 mg via INTRAVENOUS
  Filled 2016-05-24 (×6): qty 500

## 2016-05-24 MED ORDER — METHADONE HCL 10 MG PO TABS
20.0000 mg | ORAL_TABLET | Freq: Every day | ORAL | Status: DC
  Administered 2016-05-24: 20 mg via ORAL
  Filled 2016-05-24: qty 2

## 2016-05-24 MED ORDER — CALCIUM CARBONATE ANTACID 500 MG PO CHEW: 2.0000 | CHEWABLE_TABLET | ORAL | Status: DC | PRN

## 2016-05-24 MED ORDER — CLONIDINE HCL 0.1 MG PO TABS
0.1000 mg | ORAL_TABLET | Freq: Three times a day (TID) | ORAL | Status: DC
  Filled 2016-05-24 (×3): qty 1

## 2016-05-24 MED ORDER — ONDANSETRON HCL 4 MG/2ML IJ SOLN
4.0000 mg | Freq: Once | INTRAMUSCULAR | Status: AC
  Administered 2016-05-24: 4 mg via INTRAVENOUS
  Filled 2016-05-24: qty 2

## 2016-05-24 MED ORDER — DEXTROSE 5 % IV SOLN
1.0000 g | INTRAVENOUS | Status: DC
  Administered 2016-05-24: 1 g via INTRAVENOUS
  Filled 2016-05-24: qty 10

## 2016-05-24 MED ORDER — DEXTROSE 5 % IV SOLN
500.0000 mg | Freq: Once | INTRAVENOUS | Status: AC
  Administered 2016-05-24: 500 mg via INTRAVENOUS
  Filled 2016-05-24: qty 500

## 2016-05-24 MED ORDER — DEXTROSE 5 % IV SOLN
2.0000 g | INTRAVENOUS | Status: DC
  Administered 2016-05-25 – 2016-05-27 (×3): 2 g via INTRAVENOUS
  Filled 2016-05-24 (×3): qty 2

## 2016-05-24 MED ORDER — CLONIDINE HCL 0.1 MG PO TABS
0.1000 mg | ORAL_TABLET | Freq: Three times a day (TID) | ORAL | Status: DC | PRN
  Administered 2016-05-25: 0.1 mg via ORAL
  Filled 2016-05-24 (×2): qty 1

## 2016-05-24 MED ORDER — MORPHINE SULFATE (PF) 10 MG/ML IV SOLN
2.0000 mg | INTRAVENOUS | Status: DC | PRN
  Administered 2016-05-24 (×2): 2 mg via INTRAVENOUS
  Filled 2016-05-24 (×3): qty 1

## 2016-05-24 MED ORDER — SODIUM CHLORIDE 0.9 % IV SOLN
INTRAVENOUS | Status: DC
  Administered 2016-05-24 – 2016-05-29 (×8): via INTRAVENOUS

## 2016-05-24 MED ORDER — PROMETHAZINE HCL 25 MG/ML IJ SOLN
25.0000 mg | Freq: Four times a day (QID) | INTRAMUSCULAR | Status: DC | PRN
  Administered 2016-05-24 – 2016-05-28 (×6): 25 mg via INTRAVENOUS
  Filled 2016-05-24 (×6): qty 1

## 2016-05-24 NOTE — ED Notes (Signed)
EDP at bedside  

## 2016-05-24 NOTE — ED Notes (Signed)
Patient transported to CT 

## 2016-05-24 NOTE — ED Triage Notes (Signed)
Pt states that she woke up and became suddenly SOB, pt states that she has R rib pain but denies injury, states she is also pregnant and does not know how long. Pt admits to using IV drugs today.

## 2016-05-24 NOTE — Progress Notes (Signed)
OB Attending Pt unable to lie on back for U/S. Will attempt tomorrow. Still with pain but falls asleep after asking for it. Discussed with pt starting Methadone while in hospital. Pt made aware that she would need to seek continuation of her treatment at Methadone clinic after discharge. Informed would not be d/c home on Methadone. Pt verbalized understanding and agrees to start Methadone.

## 2016-05-24 NOTE — Progress Notes (Signed)
CSW acknowledges consult to social work.  CSW attempted to meet with patient, but she is sleeping.  CSW did not wake her and will attempt to see her tomorrow.

## 2016-05-24 NOTE — Progress Notes (Signed)
Pt going to be transferred to OB high risk rm 321, report called to [facility].

## 2016-05-24 NOTE — Progress Notes (Signed)
Dr Elly Modena made aware of pt status, she will speak with ED Dr, to leave pt on monitor until POC is decided.

## 2016-05-24 NOTE — ED Provider Notes (Signed)
[facility] DEPT Provider Note   CSN: [id number] Arrival date & time: 05/24/16  0301     History   Chief Complaint Chief Complaint  Patient presents with  . Shortness of Breath    HPI Kelly [name] is a 31 y.o. female.  HPI Patient reports 2 days of right lateral and right posterior chest wall discomfort.  She awoke this morning acutely short of breath.  She believes she is pregnant but has had no prenatal care to this point.  She continues to use IV drugs.  She denies vaginal bleeding or vaginal discharge.  No loss of fluid.  Denies abdominal pain.  She reports her last menstrual cycle was in July 2017.  She is a G5 P1 A3.  Denies fevers and chills.  She does report cough.   Past Medical History:  Diagnosis Date  . Abscess of breast    rt breast  . ADD (attention deficit disorder)   . Back pain   . Bipolar 1 disorder ([place])   . Hypertension   . Polysubstance abuse   . Suicidal overdose [facility])     Patient Active Problem List   Diagnosis Date Noted  . Acute post-operative pain   . Unintentional weight loss   . Palliative care encounter   . Abscess of right breast 07/13/2014  . Opioid dependence ([place]) 08/25/2013  . Unspecified episodic mood disorder 08/25/2013  . ADHD (attention deficit hyperactivity disorder) 08/25/2013  . Cocaine abuse 08/25/2013  . Thoracic back pain 01/29/2013  . CROHN'S DISEASE 05/03/2005  . COLONIC POLYPS 02/28/2003    Past Surgical History:  Procedure Laterality Date  . BACK SURGERY     scoliosis  . CESAREAN SECTION    . CHOLECYSTECTOMY  March 2015  . IRRIGATION AND DEBRIDEMENT ABSCESS Right 07/13/2014   Procedure: IRRIGATION AND DEBRIDEMEN TRIGHT BREAST ABSCESS;  Surgeon: Donnie Mesa, MD;  Location: [place];  Service: General;  Laterality: Right;    OB History    Gravida Para Term Preterm AB Living   1             SAB TAB Ectopic Multiple Live Births                   Home Medications    Prior to Admission medications     Medication Sig Start Date End Date Taking? Authorizing Provider  acetaminophen (TYLENOL) 325 MG tablet Take 2 tablets (650 mg total) by mouth every 6 (six) hours as needed for mild pain (or Temp > 100). Patient not taking: Reported on 07/08/2015 07/15/14   Erby Pian, NP  amphetamine-dextroamphetamine (ADDERALL) 20 MG tablet Take 20 mg by mouth 2 (two) times daily.    Historical Provider, MD  ibuprofen (ADVIL,MOTRIN) 600 MG tablet Take 1 tablet (600 mg total) by mouth every 6 (six) hours as needed. 07/08/15   Milly Jakob, MD  methadone (DOLOPHINE) 10 MG tablet Take 10 mg by mouth daily.    Historical Provider, MD  oxyCODONE-acetaminophen (PERCOCET) 10-325 MG tablet Take 1 tablet by mouth every 4 (four) hours as needed (severe pain). 07/08/15   Milly Jakob, MD  sulfamethoxazole-trimethoprim (BACTRIM DS,SEPTRA DS) 800-160 MG tablet Take 1 tablet by mouth 2 (two) times daily. 07/08/15   Milly Jakob, MD    Family History Family History  Problem Relation Age of Onset  . Diabetes Mother   . Diabetes Father   . Hypertension Father   . Heart attack Neg Hx   . Hyperlipidemia Neg Hx   .  Sudden death Neg Hx     Social History Social History  Substance Use Topics  . Smoking status: Current Every Day Smoker    Packs/day: 1.00    Years: 13.00    Types: Cigarettes  . Smokeless tobacco: Never Used  . Alcohol use Yes     Comment: one or two drinks weekly     Allergies   Hydrocodone and Morphine and related   Review of Systems Review of Systems  All other systems reviewed and are negative.    Physical Exam Updated Vital Signs BP 107/55   Pulse 70   Temp 98 F (36.7 C) (Oral)   Resp 22   Ht 5' 7"  (1.702 m)   Wt 180 lb (81.6 kg)   SpO2 98%   BMI 28.19 kg/m   Physical Exam  Constitutional: She is oriented to person, place, and time. She appears well-developed and well-nourished. No distress.  HENT:  Head: Normocephalic and atraumatic.  Eyes: EOM are normal.  Neck:  Normal range of motion.  Cardiovascular: Normal rate, regular rhythm and normal heart sounds.   Pulmonary/Chest: Effort normal and breath sounds normal.  Abdominal: Soft. She exhibits no distension.  Gravid uterus consistent with dates  Genitourinary:  Genitourinary Comments: Right CVA tenderness  Musculoskeletal: Normal range of motion.  Neurological: She is alert and oriented to person, place, and time.  Skin: Skin is warm and dry.  Psychiatric: She has a normal mood and affect. Judgment normal.  Nursing note and vitals reviewed.    ED Treatments / Results  Labs (all labs ordered are listed, but only abnormal results are displayed) Labs Reviewed  CBC - Abnormal; Notable for the following:       Result Value   WBC 17.1 (*)    RBC 3.49 (*)    Hemoglobin 10.5 (*)    HCT 31.5 (*)    Platelets 461 (*)    All other components within normal limits  BASIC METABOLIC PANEL - Abnormal; Notable for the following:    Sodium 133 (*)    Potassium 3.4 (*)    Chloride 99 (*)    Glucose, Bld 161 (*)    BUN <5 (*)    Creatinine, Ser 0.40 (*)    All other components within normal limits  I-STAT BETA HCG BLOOD, ED ([facility], [facility], AP ONLY) - Abnormal; Notable for the following:    I-stat hCG, quantitative >2,000.0 (*)    All other components within normal limits  URINALYSIS, ROUTINE W REFLEX MICROSCOPIC    EKG  EKG Interpretation  Date/Time:  Thursday May 24 2016 03:11:20 EST Ventricular Rate:  73 PR Interval:  128 QRS Duration: 80 QT Interval:  376 QTC Calculation: 414 R Axis:   68 Text Interpretation:  Normal sinus rhythm Nonspecific T wave abnormality Abnormal ECG No old tracing to compare Confirmed by Damika Harmon  MD, Lennette Bihari ([id number]) on 05/24/2016 5:50:01 AM       Radiology Dg Chest 1 View  Result Date: 05/24/2016 CLINICAL DATA:  Shortness of breath EXAM: CHEST 1 VIEW COMPARISON:  CT chest 02/04/2013 FINDINGS: Cardiomediastinal contours are normal. There is shallow lung inflation  without focal airspace consolidation or pulmonary edema. No pneumothorax or pleural effusion. Spinal rods are noted without visible abnormality. IMPRESSION: Shallow lung inflation without focal airspace disease. Electronically Signed   By: Ulyses Jarred M.D.   On: 05/24/2016 03:41    Procedures Procedures (including critical care time)    ++++++++++++++++++++++++++++++++++++++++++++++++  EMERGENCY DEPARTMENT [place] PREGNANCY "Study: Limited  Ultrasound of the Pelvis for Pregnancy" INDICATIONS:Pregnancy(required) Multiple views of the uterus and pelvic cavity were obtained in real-time with a multi-frequency probe. APPROACH:Transabdominal  PERFORMED BY: Myself IMAGES ARCHIVED?: Yes LIMITATIONS: Body habitus PREGNANCY FREE FLUID: None ADNEXAL FINDINGS: no abnormal adnexal findings found PREGNANCY FINDINGS: Intrauterine gestational sac noted, Fetal pole present and Fetal heart activity seen INTERPRETATION: Viable intrauterine pregnancy GESTATIONAL AGE, ESTIMATE: 30 weeks FETAL HEART RATE: 148  CPT Codes:  76815-26 (transabdominal OB)  768-26-52 (transvaginal OB, Reduced level of service for incomplete exam)    +++++++++++++++++++++++++++++++++++++++++++++++++++++    Medications Ordered in ED Medications - No data to display   Initial Impression / Assessment and Plan / ED Course  I have reviewed the triage vital signs and the nursing notes.  Pertinent labs & imaging results that were available during my care of the patient were reviewed by me and considered in my medical decision making (see chart for details).  Clinical Course     Patient will undergo CT imaging to rule out pulmonary embolus/septic emboli.  No clear pneumonia noted on chest x-ray.  She does have some urinary symptoms and therefore her right flank pain could represent right-sided pyelonephritis.  Fetal heart rate is 148.  No contractions noted.  Rapid response OB as the bedside assisting with OB evaluation.  Case  will be discussed with the obstetrician after full completion of her workup.  Emergency department.  Peripheral discussions had with OB in the rapid response nurse indicates that they may be interested in admitting the patient [facility] for obstetrical care given her lack of prenatal care to this point and her 30 week fetus  Care to Dr Regenia Skeeter to follow up on CT imaging and assist with disposition planning in coordination with obstetrical specialists  Final Clinical Impressions(s) / ED Diagnoses   Final diagnoses:  SOB (shortness of breath)    New Prescriptions New Prescriptions   No medications on file     Jola Schmidt, MD 05/24/16 0800

## 2016-05-24 NOTE — H&P (Signed)
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 She presented to [facility] ER with SOB x 24 hrs. Pt known IV heroin drug user, with last use midnight.   W/U in ER consisted of CT scan to R/O Pe, which was negative but question of RLL pneumonia. CXR was negative. Elevated WBC, UA + bacteria and PE with CVA tenderness. Working Dx of pyelonephritis and ? Pneumonia.   In regards to drug abuse. Has used x 4 years. Previously took Methadone but stopped 1 year ago d/t transportation issues. Reports using about "2 gms" a day laced with Fentyl.   NO PNC d/t transportation. Has felt Fetal movement x 1 month. POB significant for SAB x 3. C section 9 yrs ago ? NRFHT's but she also has Harrington rods in place as well. ? GHTN with that pregnancy as well  OB History    Gravida Para Term Preterm AB Living   5 1 1   3 1    SAB TAB Ectopic Multiple Live Births   3       1     Past Medical History:  Diagnosis Date  . Abscess of breast    rt breast  . ADD (attention deficit disorder)   . Back pain   . Bipolar 1 disorder ([place])   . Hypertension   . Polysubstance abuse   . Suicidal overdose [facility])    Past Surgical History:  Procedure Laterality Date  . BACK SURGERY     scoliosis  . CESAREAN SECTION    . CHOLECYSTECTOMY  March 2015  . IRRIGATION AND DEBRIDEMENT ABSCESS Right 07/13/2014   Procedure: IRRIGATION AND DEBRIDEMEN TRIGHT BREAST ABSCESS;  Surgeon: Donnie Mesa, MD;  Location: [place];  Service: General;  Laterality: Right;   Family History: family history includes Diabetes in her father and mother; Hypertension in her father. Social History:  reports that she has been smoking Cigarettes.  She has a 13.00 pack-year smoking history. She has never used smokeless tobacco. She reports that she drinks alcohol. She reports that she uses drugs, including Cocaine, Heroin, and IV.     Review of Systems  Constitutional: Positive for chills, diaphoresis and fever.  HENT:  Negative.   Respiratory: Positive for shortness of breath.   Cardiovascular: Negative.   Gastrointestinal: Positive for abdominal pain.  Genitourinary: Positive for dysuria, flank pain and urgency.   History   Blood pressure 107/67, pulse 93, temperature 98 F (36.7 C), temperature source Oral, resp. rate 20, height 5' 7"  (1.702 m), weight 180 lb (81.6 kg), SpO2 98 %. Exam Physical Exam  HENT:  Head: Normocephalic.  Eyes: EOM are normal. Pupils are equal, round, and reactive to light.  Neck: Normal range of motion. Neck supple.  Cardiovascular: Normal rate and regular rhythm.   Respiratory: Effort normal and breath sounds normal.  GI: Soft. Bowel sounds are normal.  Gravid, RCVA tenderness    Prenatal labs: NO  Assessment/Plan: IUP @ 30 wks Right Pyelonephritis  Drug abuse Prior C secrtion H/O GHTN  Admit to OB unit. Continue with Rocephin and Azithromycin. Prenatal labs and MFM U/S. UDS. SW consult. Morphine for pain and Clonidine withdrawal Sx. Will discuss with psych for recommendations for further management of drug abuse and withdrawal Sx.  Chancy Milroy 05/24/2016, 12:57 PM

## 2016-05-24 NOTE — Progress Notes (Signed)
Called to come to [facility] ED for a G5P1 came in complaining of SOB and right side flank pain. Per ED Dr ultrasound pt estimated to be 30.0 weeks, pt has had no PNC. PT does admit to IV drug use. Placed on fetal monitor to assess.Pt denies any leaking, and states she is still  having periods, and states she is feeling baby move.

## 2016-05-24 NOTE — Progress Notes (Signed)
Patient complaining of pain and states the methadone received "will not touch someone who uses at least 2 grams of heroin a day." MD notified. MD wants to stay with original plan right now. Will continue to monitor. Gildardo Cranker, RN

## 2016-05-24 NOTE — ED Provider Notes (Signed)
9:18 AM CTA without PE but possible RLL PNA. She does have a cough. Also a UTI, pyelo in differential. D/w OB, Dr. Rip Harbour, who accepts patient to [facility], they will look for a bed and call back for a direct admission. Given rocephin already due to urine, will add azithromycin for pneumonia  9:47 AM Has a room, will transfer to [facility], MD 05/24/16 [phone]

## 2016-05-25 ENCOUNTER — Encounter: Payer: Self-pay | Admitting: Obstetrics and Gynecology

## 2016-05-25 ENCOUNTER — Inpatient Hospital Stay (HOSPITAL_COMMUNITY): Payer: Medicaid Other

## 2016-05-25 DIAGNOSIS — J189 Pneumonia, unspecified organism: Secondary | ICD-10-CM | POA: Diagnosis present

## 2016-05-25 DIAGNOSIS — O34219 Maternal care for unspecified type scar from previous cesarean delivery: Secondary | ICD-10-CM | POA: Insufficient documentation

## 2016-05-25 DIAGNOSIS — O0932 Supervision of pregnancy with insufficient antenatal care, second trimester: Secondary | ICD-10-CM

## 2016-05-25 DIAGNOSIS — O0933 Supervision of pregnancy with insufficient antenatal care, third trimester: Secondary | ICD-10-CM

## 2016-05-25 DIAGNOSIS — O10919 Unspecified pre-existing hypertension complicating pregnancy, unspecified trimester: Secondary | ICD-10-CM | POA: Insufficient documentation

## 2016-05-25 DIAGNOSIS — F1910 Other psychoactive substance abuse, uncomplicated: Secondary | ICD-10-CM | POA: Diagnosis present

## 2016-05-25 DIAGNOSIS — O0930 Supervision of pregnancy with insufficient antenatal care, unspecified trimester: Secondary | ICD-10-CM

## 2016-05-25 LAB — INFLUENZA PANEL BY PCR (TYPE A & B)
Influenza A By PCR: NEGATIVE
Influenza B By PCR: NEGATIVE

## 2016-05-25 LAB — RPR: RPR: NONREACTIVE

## 2016-05-25 LAB — URINE CULTURE

## 2016-05-25 LAB — RUBELLA SCREEN

## 2016-05-25 LAB — HIV ANTIBODY (ROUTINE TESTING W REFLEX): HIV SCREEN 4TH GENERATION: NONREACTIVE

## 2016-05-25 MED ORDER — METHADONE HCL 10 MG PO TABS
30.0000 mg | ORAL_TABLET | Freq: Every day | ORAL | Status: DC
  Administered 2016-05-25: 30 mg via ORAL
  Filled 2016-05-25: qty 3

## 2016-05-25 MED ORDER — CLONIDINE HCL 0.1 MG PO TABS
0.1000 mg | ORAL_TABLET | Freq: Three times a day (TID) | ORAL | Status: DC
Start: 2016-05-25 — End: 2016-05-29
  Administered 2016-05-25 – 2016-05-29 (×12): 0.1 mg via ORAL
  Filled 2016-05-25 (×19): qty 1

## 2016-05-25 NOTE — Progress Notes (Signed)
[place] [place] ANTEPARTUM COMPREHENSIVE PROGRESS NOTE  Kelly [name] is a 31 y.o. [id number] at [redacted]w[redacted]d who is admitted for pyelonephritis and possible CAP.  Known Heroin user Fetal presentation is unsure. Length of Stay:  1  Days  Subjective: Pt still c/o of withdraw Sx. Mostly agitated.  No N/V or abd cramps presently. Back pain improved. Pt able to lay on right side now. Reports good fetal movement and denies ut ctx.   Vitals:  Blood pressure 114/74, pulse 78, temperature 97.6 F (36.4 C), temperature source Oral, resp. rate 20, height 5' 7"  (1.702 m), weight 180 lb (81.6 kg), SpO2 100 %.   Physical Examination: Lungs clear Heart RRR Abd soft + BS gravid non tender Back decreased CVA tenderness  Fetal Monitoring:  120-140's Has been difficult to monitor at times d/t to pt cooperation. Importance of being able to obtain FHT's reviewed with pt.  Labs:  Results for orders placed or performed during the hospital encounter of 05/24/16 (from the past 24 hour(s))  Rapid urine drug screen (hospital performed)   Collection Time: 05/24/16 12:30 PM  Result Value Ref Range   Opiates POSITIVE (A) NONE DETECTED   Cocaine POSITIVE (A) NONE DETECTED   Benzodiazepines NONE DETECTED NONE DETECTED   Amphetamines NONE DETECTED NONE DETECTED   Tetrahydrocannabinol NONE DETECTED NONE DETECTED   Barbiturates NONE DETECTED NONE DETECTED  Hepatitis B surface antigen   Collection Time: 05/24/16  1:53 PM  Result Value Ref Range   Hepatitis B Surface Ag Negative Negative  Rubella screen   Collection Time: 05/24/16  1:54 PM  Result Value Ref Range   Rubella <0.90 (L) Immune >0.99 index  RPR   Collection Time: 05/24/16  1:54 PM  Result Value Ref Range   RPR Ser Ql Non Reactive Non Reactive  CBC   Collection Time: 05/24/16  1:54 PM  Result Value Ref Range   WBC 13.5 (H) 4.0 - 10.5 K/uL   RBC 3.00 (L) 3.87 - 5.11 MIL/uL   Hemoglobin 9.4 (L) 12.0 - 15.0 g/dL   HCT 26.7 (L) 36.0 - 46.0 %   MCV  89.0 78.0 - 100.0 fL   MCH 31.3 26.0 - 34.0 pg   MCHC 35.2 30.0 - 36.0 g/dL   RDW 14.6 11.5 - 15.5 %   Platelets 453 (H) 150 - 400 K/uL  Differential   Collection Time: 05/24/16  1:54 PM  Result Value Ref Range   Neutrophils Relative % 86 %   Neutro Abs 11.5 (H) 1.7 - 7.7 K/uL   Lymphocytes Relative 12 %   Lymphs Abs 1.6 0.7 - 4.0 K/uL   Monocytes Relative 2 %   Monocytes Absolute 0.3 0.1 - 1.0 K/uL   Eosinophils Relative 0 %   Eosinophils Absolute 0.0 0.0 - 0.7 K/uL   Basophils Relative 0 %   Basophils Absolute 0.0 0.0 - 0.1 K/uL  Type and screen [facility]  Collection Time: 05/24/16  2:12 PM  Result Value Ref Range   ABO/RH(D) O POS    Antibody Screen NEG    Sample Expiration 05/27/2016     Imaging Studies:    U/S ordered for today   Medications:  Scheduled . azithromycin  500 mg Intravenous Q24H  . cefTRIAXone (ROCEPHIN)  IV  2 g Intravenous Q24H  . cloNIDine  0.1 mg Oral TID  . docusate sodium  100 mg Oral Daily  . methadone  30 mg Oral Daily  . prenatal multivitamin  1  tablet Oral Q1200   I have reviewed the patient's current medications.  ASSESSMENT:  IUP ? 30 wks Pyelonephritis Drug abuse UDS + cocaine/opoids No prenatal care Scoliosis Harrington rods in palce   PLAN: WBC decreasing. AF since admission. Continue with antibiotic therapy. UC pending.  Adjust treatment based on results. SW consult pending. OB U/S today for exact dating and anatomy. Continue with Methadone for withdrawal Sx. Pt has been made fully aware that Methadone will only be prescribed while in hospital. She will need to continue treatment as an outpt. Hopefully SW will be able to assist with these arrangements.   Chancy Milroy 05/25/2016,10:02 AM

## 2016-05-25 NOTE — Progress Notes (Signed)
Patient complaining of pain. Ask RN repeatedly to contact physician for pain medication. Patient states, "I'm going to leave if I don't receive any medicine. This is just wrong." MD made aware. MD states, "if patient would like to leave, patient can leave." AMA form presented to patient. Patient now states that she will try and wait it out as long as she can. Will continue to monitor patient. Gildardo Cranker, RN

## 2016-05-25 NOTE — Progress Notes (Signed)
CSW attempted to meet with patient to complete assessment due to substance use (positive cocaine and opiates on admission) and dx of Bipolar noted in medical record.  CSW notes NPNC at 30 weeks.  Patient was currently in with RN.  When RN came out of room she informed CSW that patient does not wish to speak to CSW at this time.  CSW asked that RN call CSW if patient changes her mind and will notify weekend CSW.

## 2016-05-26 LAB — COMMENT2 - HEP PANEL

## 2016-05-26 LAB — HEMOGLOBIN A1C
Hgb A1c MFr Bld: 4.9 % (ref 4.8–5.6)
Mean Plasma Glucose: 94 mg/dL

## 2016-05-26 LAB — HEPATITIS C ANTIBODY (REFLEX)

## 2016-05-26 MED ORDER — OXYCODONE HCL 5 MG PO TABS
5.0000 mg | ORAL_TABLET | Freq: Once | ORAL | Status: AC
  Administered 2016-05-26: 5 mg via ORAL
  Filled 2016-05-26: qty 1

## 2016-05-26 MED ORDER — METHADONE HCL 10 MG PO TABS
80.0000 mg | ORAL_TABLET | Freq: Every day | ORAL | Status: DC
  Administered 2016-05-26 – 2016-05-29 (×4): 80 mg via ORAL
  Filled 2016-05-26 (×4): qty 8

## 2016-05-26 NOTE — Progress Notes (Signed)
Patient asked that I wait to do her fetal monitoring until later when her pain is better. Patient is soon to be medicated.

## 2016-05-26 NOTE — Progress Notes (Signed)
No COWS protocol initiated at this time. No PRN medications ordered for patient at this time.

## 2016-05-26 NOTE — Progress Notes (Signed)
[place] ANTEPARTUM COMPREHENSIVE PROGRESS NOTE  Kelly [name] is a 31 y.o. [phone] at [redacted]w[redacted]d  who is admitted for pyelonephritis and possible CAP.  Known Heroin user Fetal presentation is unsure. Length of Stay:  2  Days  Subjective: Pt still c/o of withdraw Sx. Mostly agitated.  No N/V or abd cramps presently. Back pain improved. Pt able to lay on right side now. Reports good fetal movement and denies ut ctx.   Vitals:  Blood pressure (!) 110/56, pulse 72, temperature 97.3 F (36.3 C), resp. rate 20, height 5' 7"  (1.702 m), weight 180 lb (81.6 kg), SpO2 100 %.   Physical Examination: Lungs clear Heart RRR Abd soft + BS gravid non tender Back decreased CVA tenderness  Fetal Monitoring:  120-140's Has been difficult to monitor at times d/t to pt cooperation. Importance of being able to obtain FHT's reviewed with pt.  Labs:  Results for orders placed or performed during the hospital encounter of 05/24/16 (from the past 24 hour(s))  Influenza panel by PCR (type A & B, H1N1)   Collection Time: 05/25/16  8:00 PM  Result Value Ref Range   Influenza A By PCR NEGATIVE NEGATIVE   Influenza B By PCR NEGATIVE NEGATIVE    Imaging Studies:    Normal sonogram  Medications:  Scheduled . azithromycin  500 mg Intravenous Q24H  . cefTRIAXone (ROCEPHIN)  IV  2 g Intravenous Q24H  . cloNIDine  0.1 mg Oral TID  . docusate sodium  100 mg Oral Daily  . methadone  80 mg Oral Daily  . prenatal multivitamin  1 tablet Oral Q1200   I have reviewed the patient's current medications.  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   MRN: [id number]

## 2016-05-27 ENCOUNTER — Inpatient Hospital Stay (HOSPITAL_COMMUNITY): Payer: Medicaid Other

## 2016-05-27 DIAGNOSIS — O2303 Infections of kidney in pregnancy, third trimester: Secondary | ICD-10-CM

## 2016-05-27 DIAGNOSIS — J181 Lobar pneumonia, unspecified organism: Secondary | ICD-10-CM

## 2016-05-27 DIAGNOSIS — F1910 Other psychoactive substance abuse, uncomplicated: Secondary | ICD-10-CM

## 2016-05-27 MED ORDER — VANCOMYCIN HCL 10 G IV SOLR
1500.0000 mg | Freq: Two times a day (BID) | INTRAVENOUS | Status: DC
  Administered 2016-05-27 – 2016-05-28 (×2): 1500 mg via INTRAVENOUS
  Filled 2016-05-27 (×4): qty 1500

## 2016-05-27 MED ORDER — NICOTINE 21 MG/24HR TD PT24
21.0000 mg | MEDICATED_PATCH | Freq: Every day | TRANSDERMAL | Status: DC
  Administered 2016-05-27 – 2016-05-29 (×3): 21 mg via TRANSDERMAL
  Filled 2016-05-27 (×4): qty 1

## 2016-05-27 NOTE — Progress Notes (Signed)
Foreign body which looks like needle in forearm right Will need removal xrays reviewed  Tentative plan for removal tomorrow 730 pm with interscalene block Will need tourniquet and flouro portable

## 2016-05-27 NOTE — Progress Notes (Addendum)
Pharmacy Antibiotic Note  Kelly [name] is a 31 y.o. female [id number] at 29w6ddmitted on 05/24/2016 with SOB, questionable RLL PNA and possible pyelonephritis .  Pharmacy has been consulted for Vancomycin dosing for cellulitis  Plan: Vancomycin 1509mIV every 12 hours.  Goal trough 10-15 mcg/mL.  Height: 5' 7"  (170.2 cm) Weight: 180 lb (81.6 kg) IBW/kg (Calculated) : 61.6  Temp (24hrs), Avg:98.5 F (36.9 C), Min:98 F (36.7 C), Max:98.9 F (37.2 C)   Recent Labs Lab 05/24/16 0311 05/24/16 1354  WBC 17.1* 13.5*  CREATININE 0.40*  --     Estimated Creatinine Clearance: 112 mL/min (by C-G formula based on SCr of 0.4 mg/dL (L)).    Allergies  Allergen Reactions  . Hydrocodone Itching  . Morphine And Related Nausea And Vomiting    Antimicrobials this admission: 1/12 Ceftriaxone>>1/14 1/11 Azithromycin>>   Microbiology results: UCx: mult species present, sugg recollection   Thank you for allowing pharmacy to be a part of this patient's care.  [place]/14/2018 6:28 PM

## 2016-05-27 NOTE — Progress Notes (Signed)
Patient asked not to have fetal monitoring this morning due to feeling "awful." Patient reported fetal movement.

## 2016-05-27 NOTE — Progress Notes (Signed)
Subjective: Interval History: pt stated her right forearm is hurting with redness and swelling She stated that she broke a needle off in her arm while doing IV drugs 3-4 months ago, she tried to push it out but has been unsuccessful, it has been hurting for a few days Xray this afternoon confirms a needle in the right forearm  Objective: Vital signs in last 24 hours: Temp:  [98 F (36.7 C)-98.5 F (36.9 C)] 98 F (36.7 C) (01/14 1134) Pulse Rate:  [52-67] 52 (01/14 1134) Resp:  [18-20] 18 (01/14 1134) BP: (91-116)/(44-66) 116/66 (01/14 1134) SpO2:  [99 %] 99 % (01/14 1134)  Intake/Output from previous day: 01/13 0701 - 01/14 0700 In: 2189.6 [I.V.:2189.6] Out: -  Intake/Output this shift: Total I/O In: 625 [I.V.:125; IV Piggyback:500] Out: -     No results found for this or any previous visit (from the past 24 hour(s)).  Studies/Results: Dg Chest 1 View  Result Date: 05/24/2016 CLINICAL DATA:  Shortness of breath EXAM: CHEST 1 VIEW COMPARISON:  CT chest 02/04/2013 FINDINGS: Cardiomediastinal contours are normal. There is shallow lung inflation without focal airspace consolidation or pulmonary edema. No pneumothorax or pleural effusion. Spinal rods are noted without visible abnormality. IMPRESSION: Shallow lung inflation without focal airspace disease. Electronically Signed   By: Ulyses Jarred M.D.   On: 05/24/2016 03:41   Dg Wrist 2 Views Right  Result Date: 05/27/2016 CLINICAL DATA:  Puncture wound to distal forearm. Allowing for radiopaque foreign body. EXAM: RIGHT WRIST - 2 VIEW COMPARISON:  None. FINDINGS: 8 mm linear foreign body is identified in the lateral soft tissues of the distal forearm. No underlying bony abnormality. IMPRESSION: 8 mm linear radiopaque soft tissue foreign body in the lateral aspect of the distal forearm. Dx Electronically Signed   By: Misty Stanley M.D.   On: 05/27/2016 17:35   Ct Angio Chest Pe W And/or Wo Contrast  Result Date: 05/24/2016 CLINICAL  DATA:  Right-sided chest pain. Third trimester pregnancy. IV drug abuse. EXAM: CT ANGIOGRAPHY CHEST WITH CONTRAST TECHNIQUE: Multidetector CT imaging of the chest was performed using the standard protocol during bolus administration of intravenous contrast. Multiplanar CT image reconstructions and MIPs were obtained to evaluate the vascular anatomy. CONTRAST:  100 cc Isovue 370 intravenous COMPARISON:  02/04/2013 FINDINGS: Technically limited study due to multiple factors. The patient could only lay left lateral decubitus due to discomfort. Additionally, there is extensive streak artifact from scoliosis fixation hardware. Respiratory motion affects visualization at multiple levels. Pulmonary embolism, especially distally, could easily be obscured. Cardiovascular: Adequate opacification the pulmonary arterial tree, although limited by above described artifacts. When accounting for artifact there is no visible pulmonary artery filling defect. Normal heart size.  No pericardial effusion Mediastinum/Nodes: Negative for adenopathy or mass. Lungs/Pleura: There is subpleural triangular airspace disease in the right lateral costophrenic sulcus. As permitted by artifact, the regional vessels are patent, arguing against infarct. Patient has leukocytosis and this may reflect pneumonia. Atelectasis on the left which is considered positional. Upper Abdomen: No detected abnormality. Musculoskeletal: Scoliosis status post fusion. No acute or aggressive finding noted. Review of the MIP images confirms the above findings. IMPRESSION: 1. No detected pulmonary embolism. Moderately limited CTA due to motion and scoliosis hardware, consider extremity Doppler if there is high clinical concern for embolic disease. 2. Subpleural airspace disease in the right lower lobe. Consider pneumonia given leukocytosis and absence of visible embolism. Electronically Signed   By: Monte Fantasia M.D.   On: 05/24/2016 09:07  [place] Mfm Ob Detail +14  Wk  Result Date: 05/25/2016 ----------------------------------------------------------------------  OBSTETRICS REPORT                      (Signed Final 05/25/2016 01:19 pm) ---------------------------------------------------------------------- Patient Info  ID #:       [id number]                         D.O.B.:   Mar 18, 1986 (30 yrs)  Name:       Kelly [name]             Visit Date:  05/25/2016 08:30 am ---------------------------------------------------------------------- Performed By  Performed By:     Jodelle Green          Ref. Address:     [address]                                                             [place], [place]  Attending:        Renella Cunas MD       Location:         [facility]  Referred By:      Chancy Milroy                    MD ---------------------------------------------------------------------- Orders   #  Description                                 Code   1  [place] MFM OB DETAIL +14 [place]                     [phone]  ----------------------------------------------------------------------   #  Ordered By               Order #        Accession #    Episode #   1  Arlina Robes            [id number]      [id number]     [id number]  ---------------------------------------------------------------------- Indications   [redacted] weeks gestation of pregnancy                Z3A.29   Insufficient Prenatal Care                     O09.30   Substance  abuse affecting pregnancy,           O99.320 F19.10   antepartum; IV herion   Encounter for antenatal screening for          Z36.3   malformations   Medical complication of pregnancy (specify):   O26.90   pyelonephritis; CAP  ---------------------------------------------------------------------- OB History  Gravidity:    5         Term:   1        Prem:   0        SAB:   3  TOP:          0        Ectopic:  0        Living: 1 ---------------------------------------------------------------------- Fetal Evaluation  Num Of Fetuses:     1  Fetal Heart         165  Rate(bpm):  Cardiac Activity:   Observed  Presentation:       Cephalic  Placenta:           Posterior, above cervical os  P. Cord Insertion:  Visualized, central  Amniotic Fluid  AFI FV:      Subjectively within normal limits  AFI Sum(cm)     %Tile       Largest Pocket(cm)  15.3            54          4.46  RUQ(cm)       RLQ(cm)       LUQ(cm)        LLQ(cm)  4.41          3.32          4.46           3.11 ---------------------------------------------------------------------- Biometry  BPD:      73.4  mm     G. Age:  29w 3d         34  %    CI:        69.69   %   70 - 86                                                          FL/HC:      18.2   %   19.2 - 21.4  HC:      280.6  mm     G. Age:  30w 5d         50  %    HC/AC:      1.05       0.99 - 1.21  AC:      268.2  mm     G. Age:  30w 6d         81  %    FL/BPD:     69.8   %   71 - 87  FL:       51.2  mm     G. Age:  27w 3d        < 3  %    FL/AC:      19.1   %   20 - 24  CER:        35  mm     G. Age:  35w 0d  60  %  CM:        7.7  mm  Est. FW:    1436  gm      3 lb 3 oz     54  % ---------------------------------------------------------------------- Gestational Age  U/S Today:     29w 4d                                        EDD:   08/06/16  Best:          29w 4d    Det. By:   U/S (05/25/16)           EDD:   08/06/16 ---------------------------------------------------------------------- Anatomy  Cranium:               Appears normal         Aortic Arch:            Appears normal  Cavum:                 Appears normal         Ductal Arch:            Appears normal  Ventricles:            Appears normal         Diaphragm:              Appears normal  Choroid Plexus:        Appears normal         Stomach:                Appears normal, left                                                                         sided  Cerebellum:            Appears normal         Abdomen:                Appears normal  Posterior Fossa:       Appears normal         Abdominal Wall:         Appears nml (cord                                                                        insert, abd wall)  Nuchal Fold:           Not applicable (>70    Cord Vessels:           Appears normal ([redacted]                         wks GA)  vessel cord)  Face:                  Orbits nl; profile not Kidneys:                Appear normal                         well visualized  Lips:                  Appears normal         Bladder:                Appears normal  Thoracic:              Appears normal         Spine:                  Limited views                                                                        appear normal  Heart:                 Appears normal         Upper Extremities:      Visualized                         (4CH, axis, and situs  RVOT:                  Appears normal         Lower Extremities:      Appears normal  LVOT:                  Appears normal  Other:  Fetus appears to be a female. Nasal bone visualized. Technically          difficult due to fetal position and patient inability to comply. ---------------------------------------------------------------------- Cervix Uterus Adnexa  Cervix  Not visualized (advanced GA >29wks)  Uterus  No abnormality visualized.  Left Ovary  Not visualized.  Right Ovary  Not visualized.  Adnexa:       No abnormality visualized. ---------------------------------------------------------------------- Impression  SIUP at 29+4 weeks  Normal detailed fetal anatomy; limited views of profile, spine  and upper extremities  Normal amniotic fluid volume  EDC based on today's measurements: 08/06/16 ---------------------------------------------------------------------- Recommendations  Follow-up ultrasound for growth in 3-4 weeks  ----------------------------------------------------------------------                 Renella Cunas, MD Electronically Signed Final Report   05/25/2016 01:19 pm ----------------------------------------------------------------------   Scheduled Meds: . azithromycin  500 mg Intravenous Q24H  . cloNIDine  0.1 mg Oral TID  . docusate sodium  100 mg Oral Daily  . methadone  80 mg Oral Daily  . nicotine  21 mg Transdermal Daily  . prenatal multivitamin  1 tablet Oral Q1200  . vancomycin  1,500 mg Intravenous Q12H   Continuous Infusions: . sodium chloride Stopped (05/27/16 2000)   PRN Meds:acetaminophen, calcium carbonate, promethazine, zolpidem  Assessment/Plan: Embedded needle in the right forearm: I switched her to vancomycin to cover for staph  infection in an IV drug abuser On exam will need surgical removal, talked with Dr Marlou Sa who is the Orthopedist on call and he agrees to remove it tomorrow evening probably around 730 pm, will keep pt NPO after noon    LOS: 3 days   Mehr Depaoli H  05/27/2016 8:41 PM

## 2016-05-27 NOTE — Progress Notes (Signed)
Patient ID: Kelly [name], female   DOB: 1986/02/22, 31 y.o.   MRN: [id number]  [place]) NOTE  Kelly [name] is a 31 y.o. [id number] at 6w6dby best clinical estimate who is admitted for presumed pyelonephritis, opiate withdrawal.   Fetal presentation is unsure. Length of Stay:  3  Days  Subjective: Got 80 mg of Methadone yesterday, which worked well. Ready for another dose. Interested in getting help Patient reports the fetal movement as active. Patient reports uterine contraction  activity as none. Patient reports  vaginal bleeding as none. Patient describes fluid per vagina as None.  Vitals:  Blood pressure (!) 93/44, pulse 67, temperature 98.9 F (37.2 C), temperature source Oral, resp. rate 16, height 5' 7"  (1.702 m), weight 180 lb (81.6 kg), SpO2 100 %. Physical Examination:  General appearance - alert, well appearing, and in no distress, fidgety Chest - normal effort Abdomen - gravid, NT Fundal Height:  size equals dates Extremities: Homans sign is negative, no sign of DVT  Membranes:intact  Fetal Monitoring:  Baseline: 150 bpm, Variability: Good {> 6 bpm), Accelerations: Non-reactive but appropriate for gestational age and Decelerations: Variable: mild x 2 Labs: urine culture is complete grew mixed species  Medications:  Scheduled . azithromycin  500 mg Intravenous Q24H  . cefTRIAXone (ROCEPHIN)  IV  2 g Intravenous Q24H  . cloNIDine  0.1 mg Oral TID  . docusate sodium  100 mg Oral Daily  . methadone  80 mg Oral Daily  . prenatal multivitamin  1 tablet Oral Q1200   I have reviewed the patient's current medications.  ASSESSMENT: Active Problems:   Pyelonephritis affecting pregnancy   Polysubstance abuse   No prenatal care in current pregnancy   Community acquired pneumonia   PLAN: Continue in-house care until at least Monday to work on Methadone treatment programs.  TDonnamae Jude MD 05/27/2016,7:31 AM

## 2016-05-27 NOTE — Clinical SW OB High Risk (Signed)
Clinical Social Work Antenatal   Clinical Social Worker:  Daleen Bo Jionni Helming, LCSW Date/Time:  05/27/2016, 12:34 PM Gestational Age on Admission:  31 y.o. Admitting Diagnosis:  Right Pyelonephritis, drug abuse, h/o GHTN, IUP @ 30 weeks w/ no PNC.   Expected Delivery Date:  08/03/16  Family/Home Environment  Home Address: Mailing address -[place], [place] [address] Physical location address- [place], [place] [address]  Household Member/Support Name:  Clement Husbands (resides at physical location address)-[phone]  Relationship:  Friend Other Support:  None identified.   Psychosocial Data  Information Source:  Patient Interview Resources:  None identified.   Employment:  Unemployed.   Medicaid [facility]): [employer]  School:  N/A   Current Grade:  N/A  Homebound Arranged: No  Other Resources:  Medicaid  Cultural/Environment Issues Impacting Care:  IV drug abuser, mental health hx significant of bipolar and depression, unstable housing, no transportation.   Strengths/Weaknesses/Factors to Consider  Concerns Related to Hospitalization: No PNC @ 30 weeks. Current IV drug abuser. [name] does not have custody of her first child. No means of transportation to receive methadone treatment.   Previous Pregnancies/Feelings Towards Pregnancy?  Concerns related to being/becoming a mother?:  [name]'s feelings about current pregnancy are not good. [name] is excited to meet her son; however, with her substance use issue, she is concerned she wont be able to care for him. [name] notes this to be her second successful pregnancy and she was not able to raise her older son therefore, she is worried about caring for this one.   Social Support (FOB? Who is/will be helping with baby/other kids?): [name] identifies her friend Clement Husbands as her only support. She notes FOB [name] DOB 01/19/74)  is currently incarcerated thus cannot help upon baby's arrival. [name] is unsure when  FOB will be released. She notes that her mother has custody of her oldest child, Helyn App Reed-Kackley DOB 06/05/07 and they reside in [place], [place]; however, she does not communicate well with her.   Couples Relationship (describe): Unknown at this time due to FOB's incarceration.   Recent Stressful Life Events (life changes in past year?):  [name] notes the past few years of her life being stressful. She contributes most of the stress being due to substance use. [name] also notes since being pregnant she has lost transportation and is unable to get to [facility] to get an ID thus she is unable to enroll in pregnancy medicaid benefits.    Prenatal Care/Education/Home Preparations: [name] has received no PNC. She also has not made any arrangements and/or preparations for baby's arrival home. With [name]'s current substance use problem, it is unknown whether baby will d/c home to her care or someone else's. A DSS report will be warranted upon [name]'s delivery to decide that.    Domestic Violence (of any type):  No If Yes to Domestic Violence, Describe/Action Plan:  N/A   Substance Use During Pregnancy: Yes   Follow-up Recommendations: [facility] for initial and ongoing [facility], [address] for substance abuse maintenance.   Patient Advised/Response:   [name] is accepting to this writers recommendations. [name] notes she is interested in ongoing treatment for substance abuse issues. She notes she is ready to quit and get stable so she can care for her baby.   Other:   N/A   Clinical Assessment/Plan: CSW met with [name] at bedside to complete antenatal assessment for consult regarding [name]'s current substance use. Upon this writer's arrival,  [name] was noted to be laying in bed awake relaxing. [name] was warm and inviting of this writer's visit. This Probation officer explained role and reasoning for visit being to intervene on current and assess for new psychosocial needs. At this time, [name] acknowledges she has a problem with  substance and is interested in getting treatment to stop. [name] also notes that she is not currently working and does not have transportation of her own. She notes she is currently living with a friend, Clement Husbands, who is being of help to her currently. [name] notes Gwyndolyn Saxon is a positive help for her, as he does not use substance and lives within the general area of [place] and not in [place], [place] where her mailing address is. This Probation officer inquired why patient has not received any  PNC. [name] notes she is unable to get to the [facility] to get an ID therefore cannot enroll in pregnancy medicaid. This Probation officer informed [name] that upon her d/c from the hospital, we can provide her with two bus passes to be used for transport to [name] and then to the [name] office and/or [facility] Department where she can receive [facility] in the interim. [name] verbalized understanding and is accepting of that.   This Probation officer assessed [name]'s current efforts to treat her substance use. [name] notes she was receiving methadone treatment at [facility] 1 year ago but has since stopped due to transportation issues. [name] notes she is allowed to go back for services there once the transportation concerns are resolved. This Probation officer assessed what plans, if any, have been arranged for when baby arrives. [name] notes she has not made any arrangements and her home is not prepared. This Probation officer explained to [name] the importance of preparing, although we do not yet know what baby's disposition will be when he is born. [name] verbalized understanding. This Probation officer provided [name] with substance resources and two bus passes's to be used upon her d/c from ante unit.   This Probation officer has added [name] to CSW case watch list to be re-assessed upon delivery. At this time, no other needs addressed or requested. Case closed to this CSW.   Kateryna Grantham, MSW, LCSW-A Clinical Social Worker   [facility]  Office: [phone]

## 2016-05-27 NOTE — Progress Notes (Signed)
Multiple attempts have been made to obtain IV access for antibiotic therapy. IV team has been called at this point.

## 2016-05-28 ENCOUNTER — Inpatient Hospital Stay (HOSPITAL_COMMUNITY): Payer: Medicaid Other | Admitting: Anesthesiology

## 2016-05-28 ENCOUNTER — Encounter (HOSPITAL_COMMUNITY)
Admission: EM | Discharge status: Against Medical Advice | Payer: Self-pay | Source: Home / Self Care | Attending: Obstetrics and Gynecology

## 2016-05-28 HISTORY — PX: FOREIGN BODY REMOVAL: SHX962

## 2016-05-28 SURGERY — REMOVAL FOREIGN BODY EXTREMITY
Anesthesia: Monitor Anesthesia Care | Site: Arm Lower | Laterality: Right

## 2016-05-28 SURGERY — FOREIGN BODY REMOVAL ADULT
Anesthesia: General | Laterality: Right

## 2016-05-28 SURGERY — Surgical Case
Anesthesia: *Unknown

## 2016-05-28 SURGERY — REPAIR, LACERATION, 2 OR MORE
Anesthesia: General | Laterality: Right

## 2016-05-28 MED ORDER — OXYCODONE HCL 5 MG/5ML PO SOLN: 5.0000 mg | Freq: Once | ORAL | Status: DC | PRN

## 2016-05-28 MED ORDER — FENTANYL CITRATE (PF) 100 MCG/2ML IJ SOLN: 25.0000 ug | INTRAMUSCULAR | Status: DC | PRN

## 2016-05-28 MED ORDER — MORPHINE SULFATE (PF) 4 MG/ML IV SOLN
INTRAVENOUS | Status: AC
  Filled 2016-05-28: qty 2

## 2016-05-28 MED ORDER — FENTANYL CITRATE (PF) 100 MCG/2ML IJ SOLN
INTRAMUSCULAR | Status: AC
  Filled 2016-05-28: qty 2

## 2016-05-28 MED ORDER — PROPOFOL 10 MG/ML IV BOLUS
INTRAVENOUS | Status: DC | PRN
  Administered 2016-05-28: 80 mg via INTRAVENOUS

## 2016-05-28 MED ORDER — ACETAMINOPHEN 325 MG PO TABS: 650.0000 mg | ORAL_TABLET | Freq: Four times a day (QID) | ORAL | Status: DC | PRN

## 2016-05-28 MED ORDER — BUPIVACAINE HCL (PF) 0.5 % IJ SOLN
INTRAMUSCULAR | Status: DC | PRN
  Administered 2016-05-28: 10 mL

## 2016-05-28 MED ORDER — FENTANYL CITRATE (PF) 100 MCG/2ML IJ SOLN
INTRAMUSCULAR | Status: DC | PRN
  Administered 2016-05-28: 100 ug via INTRAVENOUS

## 2016-05-28 MED ORDER — MIDAZOLAM HCL 2 MG/2ML IJ SOLN
INTRAMUSCULAR | Status: AC
  Filled 2016-05-28: qty 2

## 2016-05-28 MED ORDER — MEPIVACAINE HCL 1.5 % IJ SOLN
INTRAMUSCULAR | Status: DC | PRN
  Administered 2016-05-28: 30 mL via PERINEURAL

## 2016-05-28 MED ORDER — MORPHINE SULFATE (PF) 4 MG/ML IV SOLN
INTRAVENOUS | Status: DC | PRN
  Administered 2016-05-28: 8 mg

## 2016-05-28 MED ORDER — OXYCODONE HCL 5 MG PO TABS: 5.0000 mg | ORAL_TABLET | Freq: Once | ORAL | Status: DC | PRN

## 2016-05-28 MED ORDER — BUPIVACAINE HCL (PF) 0.5 % IJ SOLN
INTRAMUSCULAR | Status: AC
  Filled 2016-05-28: qty 30

## 2016-05-28 MED ORDER — 0.9 % SODIUM CHLORIDE (POUR BTL) OPTIME
TOPICAL | Status: DC | PRN
  Administered 2016-05-28: 1000 mL

## 2016-05-28 MED ORDER — VANCOMYCIN HCL 1000 MG IV SOLR
INTRAVENOUS | Status: DC | PRN
  Administered 2016-05-28: 1500 mg via INTRAVENOUS

## 2016-05-28 MED ORDER — LACTATED RINGERS IV SOLN
INTRAVENOUS | Status: DC | PRN
  Administered 2016-05-28: 19:00:00 via INTRAVENOUS

## 2016-05-28 MED ORDER — VANCOMYCIN HCL 10 G IV SOLR
1500.0000 mg | INTRAVENOUS | Status: DC
  Filled 2016-05-28: qty 1500

## 2016-05-28 MED ORDER — PROPOFOL 500 MG/50ML IV EMUL
INTRAVENOUS | Status: DC | PRN
  Administered 2016-05-28: 75 ug/kg/min via INTRAVENOUS

## 2016-05-28 MED ORDER — LIDOCAINE HCL (CARDIAC) 20 MG/ML IV SOLN
INTRAVENOUS | Status: DC | PRN
  Administered 2016-05-28: 25 mg via INTRAVENOUS

## 2016-05-28 MED ORDER — ACETAMINOPHEN 650 MG RE SUPP: 650.0000 mg | Freq: Four times a day (QID) | RECTAL | Status: DC | PRN

## 2016-05-28 MED ORDER — PROPOFOL 10 MG/ML IV BOLUS
INTRAVENOUS | Status: AC
  Filled 2016-05-28: qty 20

## 2016-05-28 MED ORDER — CLONIDINE HCL (ANALGESIA) 100 MCG/ML EP SOLN
150.0000 ug | Freq: Once | EPIDURAL | Status: DC
  Filled 2016-05-28: qty 1.5

## 2016-05-28 SURGICAL SUPPLY — 31 items
BNDG COHESIVE 2X5 WHT NS (GAUZE/BANDAGES/DRESSINGS) ×3 IMPLANT
BNDG ELASTIC 2X5.8 VLCR STR LF (GAUZE/BANDAGES/DRESSINGS) ×3 IMPLANT
BNDG ESMARK 4X9 LF (GAUZE/BANDAGES/DRESSINGS) ×3 IMPLANT
COVER SURGICAL LIGHT HANDLE (MISCELLANEOUS) ×3 IMPLANT
CUFF TOURNIQUET SINGLE 18IN (TOURNIQUET CUFF) ×3 IMPLANT
DRAPE INCISE IOBAN 66X45 STRL (DRAPES) ×3 IMPLANT
DRAPE OEC MINIVIEW 54X84 (DRAPES) ×3 IMPLANT
DRAPE U-SHAPE 47X51 STRL (DRAPES) ×3 IMPLANT
ELECT REM PT RETURN 9FT ADLT (ELECTROSURGICAL) ×3
ELECTRODE REM PT RTRN 9FT ADLT (ELECTROSURGICAL) ×1 IMPLANT
GAUZE SPONGE 4X4 12PLY STRL (GAUZE/BANDAGES/DRESSINGS) ×3 IMPLANT
GLOVE BIOGEL PI IND STRL 8 (GLOVE) ×1 IMPLANT
GLOVE BIOGEL PI INDICATOR 8 (GLOVE) ×2
GLOVE SURG ORTHO 8.0 STRL STRW (GLOVE) ×3 IMPLANT
GOWN STRL REUS W/ TWL LRG LVL3 (GOWN DISPOSABLE) ×2 IMPLANT
GOWN STRL REUS W/TWL LRG LVL3 (GOWN DISPOSABLE) ×4
KIT BASIN OR (CUSTOM PROCEDURE TRAY) ×3 IMPLANT
KIT ROOM TURNOVER OR (KITS) ×3 IMPLANT
MANIFOLD NEPTUNE II (INSTRUMENTS) ×3 IMPLANT
NEEDLE FILTER BLUNT 18X 1/2SAF (NEEDLE) ×2
NEEDLE FILTER BLUNT 18X1 1/2 (NEEDLE) ×1 IMPLANT
NEEDLE HYPO 25GX1X1/2 BEV (NEEDLE) ×3 IMPLANT
NS IRRIG 1000ML POUR BTL (IV SOLUTION) ×3 IMPLANT
PACK ORTHO EXTREMITY (CUSTOM PROCEDURE TRAY) ×3 IMPLANT
PAD ABD 8X10 STRL (GAUZE/BANDAGES/DRESSINGS) ×3 IMPLANT
PAD ARMBOARD 7.5X6 YLW CONV (MISCELLANEOUS) ×6 IMPLANT
SUT ETHILON 3 0 PS 1 (SUTURE) ×3 IMPLANT
SYR CONTROL 10ML LL (SYRINGE) ×3 IMPLANT
TOWEL OR 17X24 6PK STRL BLUE (TOWEL DISPOSABLE) ×3 IMPLANT
TOWEL OR 17X26 10 PK STRL BLUE (TOWEL DISPOSABLE) ×3 IMPLANT
WATER STERILE IRR 1000ML POUR (IV SOLUTION) ×3 IMPLANT

## 2016-05-28 NOTE — Progress Notes (Signed)
Patient ID: Kelly [name], female   DOB: Feb 25, 1986, 31 y.o.   MRN: [id number]  [place]) NOTE  Kelly [name] is a 31 y.o. [id number] at [redacted]w[redacted]d by best clinical estimate who is admitted for presumed pyelonephritis, opiate withdrawal.   Fetal presentation is unsure. Length of Stay:  4  Days  Subjective: Got 80 mg of Methadone yesterday, which worked well. Ready for another dose. Interested in getting help, ideally wants in house rehab but understands being pregnant that is unlikely, methadone clinic would be a good option Patient reports the fetal movement as active. Patient reports uterine contraction  activity as none. Patient reports  vaginal bleeding as none. Patient describes fluid per vagina as None.  Vitals:  Blood pressure 125/65, pulse 69, temperature 98 F (36.7 C), temperature source Oral, resp. rate 20, height 5' 7"  (1.702 m), weight 180 lb (81.6 kg), SpO2 100 %. Physical Examination:  General appearance - alert, well appearing, and in no distress, fidgety Chest - normal effort Abdomen - gravid, NT Fundal Height:  size equals dates Extremities: Homans sign is negative, no sign of DVT  Cellulitis right forearm Membranes:intact  Fetal Monitoring:  Baseline: 150 bpm, Variability: Good {> 6 bpm), Accelerations: Non-reactive but appropriate for gestational age and Decelerations: Variable: mild x 2 Labs: urine culture is complete grew mixed species  Medications:  Scheduled . azithromycin  500 mg Intravenous Q24H  . cloNIDine  0.1 mg Oral TID  . docusate sodium  100 mg Oral Daily  . methadone  80 mg Oral Daily  . nicotine  21 mg Transdermal Daily  . prenatal multivitamin  1 tablet Oral Q1200  . vancomycin  1,500 mg Intravenous Q12H   I have reviewed the patient's current medications.  ASSESSMENT: 369w0dstimated Date of Delivery: 08/06/16 Needle embedded in right forearm now with cellulits Active Problems:   Pyelonephritis affecting  pregnancy   Polysubstance abuse   No prenatal care in current pregnancy   Community acquired pneumonia   PLAN: Switched to vancomycin to cover the cellulits associated with "lost" needle, looks better this am, Dr DeMarlou Saill take patient for exploration later this evening, NPO after noon Stopped rocephin, continued azithromycin for probable CAP Pyelonephritis is questionable at this point  EUFlorian BuffMD 05/28/2016,8:09 AM Patient ID: Kelly Plumfemale   DOB: [birth year]   MRN: [id number]

## 2016-05-28 NOTE — Consult Note (Signed)
Reason for Consult: Foreign body right arm Referring Physician: Dr Kelly [name] is an 31 y.o. female.  HPI: Kelly [name] is a 31 year old female currently admitted to [facility] for pyelonephritis.  She is approximately [redacted] weeks pregnant.  She placed a needle in her arm 4 months ago and it broke off.  It's been relatively asymptomatic since that time until this hospitalization where despite antibiotics she developed pain and swelling and redness around the site.  She presents now for evaluation and operative management of retained foreign body which is a small needle in the distal radial forearm.  Past Medical History:  Diagnosis Date  . Abscess of breast    rt breast  . ADD (attention deficit disorder)   . Back pain   . Bipolar 1 disorder ([place])   . Hypertension   . Polysubstance abuse   . Suicidal overdose [facility])     Past Surgical History:  Procedure Laterality Date  . BACK SURGERY     scoliosis-in care everywhere 08/1999  . CESAREAN SECTION    . CHOLECYSTECTOMY  March 2015  . IRRIGATION AND DEBRIDEMENT ABSCESS Right 07/13/2014   Procedure: IRRIGATION AND DEBRIDEMEN TRIGHT BREAST ABSCESS;  Surgeon: Donnie Mesa, MD;  Location: [facility];  Service: General;  Laterality: Right;    Family History  Problem Relation Age of Onset  . Diabetes Mother   . Diabetes Father   . Hypertension Father   . Heart attack Neg Hx   . Hyperlipidemia Neg Hx   . Sudden death Neg Hx     Social History:  reports that she has been smoking Cigarettes.  She has a 13.00 pack-year smoking history. She has never used smokeless tobacco. She reports that she drinks alcohol. She reports that she uses drugs, including Cocaine, Heroin, and IV.  Allergies:  Allergies  Allergen Reactions  . Hydrocodone Itching  . Morphine And Related Nausea And Vomiting    Medications: I have reviewed the patient's current medications.  No results found for this or any previous visit (from the past 48  hour(s)).  Dg Wrist 2 Views Right  Result Date: 05/27/2016 CLINICAL DATA:  Puncture wound to distal forearm. Allowing for radiopaque foreign body. EXAM: RIGHT WRIST - 2 VIEW COMPARISON:  None. FINDINGS: 8 mm linear foreign body is identified in the lateral soft tissues of the distal forearm. No underlying bony abnormality. IMPRESSION: 8 mm linear radiopaque soft tissue foreign body in the lateral aspect of the distal forearm. Dx Electronically Signed   By: Misty Stanley M.D.   On: 05/27/2016 17:35    Review of Systems  Genitourinary: Positive for dysuria.  Musculoskeletal: Positive for joint pain.  All other systems reviewed and are negative.  Blood pressure 109/68, pulse (!) 52, temperature 97.8 F (36.6 C), temperature source Oral, resp. rate 18, height 5' 7"  (1.702 m), weight 180 lb (81.6 kg), SpO2 99 %. Physical Exam  Constitutional: She appears well-developed.  HENT:  Head: Normocephalic.  Eyes: Pupils are equal, round, and reactive to light.  Neck: Normal range of motion.  Cardiovascular: Normal rate.   Respiratory: Effort normal.  Skin: Skin is warm.  Psychiatric: She has a normal mood and affect.  Examination of the right wrist demonstrates some swelling on the radial aspect of the wrist.  Measures about 2 x 2 centimeters.  As is the area where the patient has felt there is been a broken needle for the past 4 months.  Slightly tender to palpation but the elbow  range of motion is intact on the right and there is no proximal lymphadenopathy.  Motor sensory function to the hand is intact radial pulses intact  Assessment/Plan: Impression is retained foreign body now becoming somewhat symptomatic.  Plan is for surgical removal.  This will require fluoroscopy.  Risks and benefits discussed with the patient.  Primary risk in this particular event include injury to the superficial branch of the radial nerve.  Patient understands and wishes to proceed.  Kelly [name] 05/28/2016, 7:30 PM

## 2016-05-28 NOTE — Anesthesia Preprocedure Evaluation (Signed)
Anesthesia Evaluation    Airway        Dental   Pulmonary Current Smoker,           Cardiovascular hypertension,      Neuro/Psych    GI/Hepatic   Endo/Other    Renal/GU      Musculoskeletal   Abdominal   Peds  Hematology   Anesthesia Other Findings   Reproductive/Obstetrics                             Anesthesia Physical Anesthesia Plan Anesthesia Quick Evaluation

## 2016-05-28 NOTE — Anesthesia Procedure Notes (Signed)
Performed by: Zorita Pang

## 2016-05-28 NOTE — Progress Notes (Signed)
Report called to Dory Larsen, RN at [facility]. Patient awaiting [facility] to transport back to room 312. Denies pain, block to RUE- unable to move fingers/ and arm numb.

## 2016-05-28 NOTE — Progress Notes (Signed)
Patient transported to [facility] for procedure via [facility].  Vitals WNL, IV uncomplicated.

## 2016-05-28 NOTE — Progress Notes (Signed)
Patient wiped vagina with surgery wipes. Patient is now requesting pain meds. Sits bath offered, cold pack offered.  Cold pack was working and now she is asking for medication.  Patient is to be transported to [facility] later to have needle surgically removed from where it broke off inside her.  Faculty MD aware of situation, House aware, Unit Manager aware and Charge Nurse aware.

## 2016-05-28 NOTE — Progress Notes (Signed)
Patient refused EFM.  Patient informed this is every shift, patient states we can monitor baby when she returns.

## 2016-05-28 NOTE — Transfer of Care (Signed)
Immediate Anesthesia Transfer of Care Note  Patient: [name]  Procedure(s) Performed: Procedure(s): REMOVAL FOREIGN BODY RIGHT ARM (Right)  Patient Location: PACU  Anesthesia Type:MAC and Regional  Level of Consciousness: awake, alert , oriented and patient cooperative  Airway & Oxygen Therapy: Patient Spontanous Breathing and Patient connected to nasal cannula oxygen  Post-op Assessment: Report given to RN and Post -op Vital signs reviewed and stable  Post vital signs: Reviewed and stable  Last Vitals:  Vitals:   05/28/16 1605 05/28/16 2034  BP: 109/68 (P) 117/75  Pulse: (!) 52   Resp: 18 (!) (P) 22  Temp: 36.6 C (P) 36.4 C    Last Pain:  Vitals:   05/28/16 2034  TempSrc:   PainSc: (P) 0-No pain      Patients Stated Pain Goal: 3 (97/41/63 8453)  Complications: No apparent anesthesia complications

## 2016-05-28 NOTE — Progress Notes (Signed)
Patient has requested a new nurse because I could not give her the methadone due at 1000 at 0830.  Patient requested "nausea" medicine; nausea medicine administered per request. Patient asked nurse if she had children and where she was from.  Nurse answered all questions as requested.

## 2016-05-28 NOTE — Progress Notes (Signed)
   05/27/16 2336  Vitals  BP 101/69  BP Location Right Arm  BP Method Automatic  Patient Position (if appropriate) Lying  Pulse Rate (!) 51  Pulse Rate Source Monitor  Resp 16  Oxygen Therapy  SpO2 99 %  O2 Device Room Air  Pain Assessment  Pain Score Asleep  30 minutes post Clonidine 0.1 mg administration.

## 2016-05-28 NOTE — Plan of Care (Signed)
Problem: Skin Integrity: Goal: Risk for impaired skin integrity will decrease Outcome: Not Progressing Foreign body which looks like needle in forearm right is tentative plan for removal tomorrow 730 pm with interscalene block by Dr. Tonna Corner.

## 2016-05-28 NOTE — Anesthesia Preprocedure Evaluation (Signed)
Anesthesia Evaluation  Patient identified by MRN, date of birth, ID band Patient awake    Reviewed: Allergy & Precautions, NPO status , Patient's Chart, lab work & pertinent test results  History of Anesthesia Complications Negative for: history of anesthetic complications  Airway Mallampati: II  TM Distance: >3 FB Neck ROM: Full    Dental  (+) Teeth Intact   Pulmonary pneumonia, Current Smoker,    + rhonchi        Cardiovascular negative cardio ROS   Rhythm:Regular     Neuro/Psych PSYCHIATRIC DISORDERS Bipolar Disorder negative neurological ROS     GI/Hepatic negative GI ROS, (+)     substance abuse  IV drug use,   Endo/Other    Renal/GU      Musculoskeletal   Abdominal   Peds  Hematology   Anesthesia Other Findings   Reproductive/Obstetrics (+) Pregnancy                             Anesthesia Physical Anesthesia Plan  ASA: III  Anesthesia Plan: MAC and Regional   Post-op Pain Management:    Induction:   Airway Management Planned: Natural Airway, Nasal Cannula and Simple Face Mask  Additional Equipment: None  Intra-op Plan:   Post-operative Plan:   Informed Consent: I have reviewed the patients History and Physical, chart, labs and discussed the procedure including the risks, benefits and alternatives for the proposed anesthesia with the patient or authorized representative who has indicated his/her understanding and acceptance.   Dental advisory given  Plan Discussed with: CRNA and Surgeon  Anesthesia Plan Comments:         Anesthesia Quick Evaluation

## 2016-05-28 NOTE — Anesthesia Procedure Notes (Signed)
Procedure Name: MAC Date/Time: 05/28/2016 7:42 PM Performed by: Zorita Pang Pre-anesthesia Checklist: Patient identified, Emergency Drugs available, Suction available, Patient being monitored and Timeout performed Oxygen Delivery Method: Simple face mask

## 2016-05-28 NOTE — Brief Op Note (Signed)
05/24/2016 - 05/28/2016  8:27 PM  PATIENT:  [name]  31 y.o. female  PRE-OPERATIVE DIAGNOSIS:  FOREIGN BODY/RIGHT ARM  POST-OPERATIVE DIAGNOSIS:  Deep foreign body  PROCEDURE:  Procedure(s): REMOVAL FOREIGN BODY RIGHT ARM  SURGEON:  Surgeon(s): Meredith Pel, MD  ASSISTANT: none  ANESTHESIA:   regional  EBL: 5 ml    Total I/O In: 450 [I.V.:450] Out: 3 [Blood:3]  BLOOD ADMINISTERED: none  DRAINS: none   LOCAL MEDICATIONS USED:  Marcaine mso4  SPECIMEN:  No Specimen  COUNTS:  YES  TOURNIQUET:   Total Tourniquet Time Documented: Upper Arm (Right) - 15 minutes Total: Upper Arm (Right) - 15 minutes   DICTATION: .Other Dictation: Dictation Number [id number]  PLAN OF CARE: Admit to inpatient   PATIENT DISPOSITION:  PACU - hemodynamically stable

## 2016-05-28 NOTE — Progress Notes (Signed)
Executed consent in chart.  [facility] will be here at 1600 to transport patient to surgery at [facility]

## 2016-05-28 NOTE — Anesthesia Procedure Notes (Signed)
Anesthesia Regional Block:  Axillary brachial plexus block  Pre-Anesthetic Checklist: ,, timeout performed, Correct Patient, Correct Site, Correct Laterality, Correct Procedure, Correct Position, site marked, Risks and benefits discussed,  Surgical consent,  Pre-op evaluation,  At surgeon's request and post-op pain management  Laterality: Upper and Right  Prep: chloraprep       Needles:  Injection technique: Single-shot  Needle Type: Echogenic Stimulator Needle          Additional Needles:  Procedures: ultrasound guided (picture in chart) and nerve stimulator Axillary brachial plexus block Narrative:  Start time: 05/28/2016 7:21 PM End time: 05/28/2016 7:26 PM Injection made incrementally with aspirations every 5 mL.  Performed by: Personally  Anesthesiologist: Alizae Bechtel  Additional Notes: H+P and labs reviewed, risks and benefits discussed with patient, procedure tolerated well without complications

## 2016-05-29 ENCOUNTER — Encounter (HOSPITAL_COMMUNITY): Payer: Self-pay | Admitting: Orthopedic Surgery

## 2016-05-29 DIAGNOSIS — Z3A30 30 weeks gestation of pregnancy: Secondary | ICD-10-CM

## 2016-05-29 DIAGNOSIS — L03113 Cellulitis of right upper limb: Secondary | ICD-10-CM

## 2016-05-29 DIAGNOSIS — M60221 Foreign body granuloma of soft tissue, not elsewhere classified, right upper arm: Secondary | ICD-10-CM

## 2016-05-29 MED ORDER — COMPLETENATE 29-1 MG PO CHEW
1.0000 | CHEWABLE_TABLET | Freq: Every day | ORAL | Status: DC
  Administered 2016-05-29: 1 via ORAL
  Filled 2016-05-29 (×2): qty 1

## 2016-05-29 NOTE — Progress Notes (Signed)
Patient signed AMA papers. Left the unit ambulatory with friend.

## 2016-05-29 NOTE — Progress Notes (Signed)
Patient ID: Kelly [name], female   DOB: Oct 25, 1985, 31 y.o.   MRN: [id number]  Kelly [name]) NOTE  Kelly [name] is a 31 y.o. [id number] at [redacted]w[redacted]d by best clinical estimate who is admitted for presumed pyelonephritis, opiate withdrawal.   Fetal presentation is unsure. Length of Stay:  5  Days  Subjective: Current dose of Methadone adequate. No sign of withdrawal, although continuing to receive some opiate pain medicine for arm pain after procedure yesterday to remove needle. Breathing is fine, no cough, shortness of breath, wheezing. Patient reports the fetal movement as active. Patient reports uterine contraction  activity as none. Patient reports  vaginal bleeding as none. Patient describes fluid per vagina as None.  Vitals:  Blood pressure (!) 98/53, pulse (!) 57, temperature 97.9 F (36.6 C), temperature source Oral, resp. rate 18, height 5' 7"  (1.702 m), weight 180 lb (81.6 kg), SpO2 99 %. Physical Examination:  General appearance - alert, well appearing, and in no distress, fidgety Chest - normal effort Abdomen - gravid, NT Fundal Height:  size equals dates Extremities: Homans sign is negative, no sign of DVT  Right arm wrapped Membranes:intact  Fetal Monitoring:  Baseline: 150 bpm, Variability: Good {> 6 bpm), Accelerations: Non-reactive but appropriate for gestational age and Decelerations: Variable: mild x 2 Labs: urine culture is complete grew mixed species  Medications:  Scheduled . cloNIDine  0.1 mg Oral TID  . cloNIDine  150 mcg Epidural Once  . docusate sodium  100 mg Oral Daily  . methadone  80 mg Oral Daily  . nicotine  21 mg Transdermal Daily  . prenatal multivitamin  1 tablet Oral Q1200   I have reviewed the patient's current medications.  ASSESSMENT: [id number] Date of Delivery: 08/06/16 Needle embedded in right forearm now with cellulits Active Problems:   Pyelonephritis affecting pregnancy   Polysubstance abuse  No prenatal care in current pregnancy   Community acquired pneumonia   Pyelonephritis   PLAN: 1. CAP  S/p treatment.   D/c azithromycin - patient should be covered for additional 5 days after stopping medication. 2. Cellulitis  Continue vancomycin.  Pt POD#1 from foreign body removal.  Will need recommendations from ortho to see when pt can be discharged to home. Can likely be d/c'd on bactrim, which would cover MRSA 3. Polysubstance use  Need [facility], re [facility]adone clinic  Continue methadone [id number] 4. [redacted] weeks gestation  NST reactive x2  [name] 05/29/2016,7:26 AM Patient ID: Kelly [name]   DOB: [birth year] 31o.   MRN: [id number]

## 2016-05-29 NOTE — Progress Notes (Signed)
I spent time with pt and her support friend.  I asked her about her needs for emotional support as she prepares for her next steps.  She replied that she feels better now that she has a plan; she stated that she knows she needs to follow the plan.  She is very focused on the logistics at this time and seems determined; but she was somewhat guarded emotionally.  She asked questions about  baby supplies (crib, car seat, etc) and her friend asked questions about housing options.  I consulted with Laurey Arrow, LCSW, who gave me information for [facility] to pass on.  I gave that to pt and family and let them know that they should gather the resources they are able to gather at this time for baby supplies.  If there are additional needs for supplies, there are resources we can refer them to at the time of delivery.  Chaplain [name], Bcc Pager, [phone] 1:15 PM    05/29/16 1300  Clinical Encounter Type  Visited With Patient;Family  Visit Type Spiritual support  Referral From Nurse  Spiritual Encounters  Spiritual Needs Emotional

## 2016-05-29 NOTE — Progress Notes (Signed)
Pt had good radial and brachial pulses in right arm. Skin warm, dry pink.  Right hand is swollen, pt able to move fingers easily.

## 2016-05-29 NOTE — Anesthesia Postprocedure Evaluation (Addendum)
Anesthesia Post Note  Patient: Genowefa N Wares  Procedure(s) Performed: Procedure(s) (LRB): REMOVAL FOREIGN BODY RIGHT ARM (Right)  Patient location during evaluation: PACU Anesthesia Type: Regional Level of consciousness: awake and alert Pain management: pain level controlled Vital Signs Assessment: post-procedure vital signs reviewed and stable Respiratory status: spontaneous breathing, nonlabored ventilation, respiratory function stable and patient connected to nasal cannula oxygen Cardiovascular status: stable and blood pressure returned to baseline Postop Assessment: no signs of nausea or vomiting Anesthetic complications: no       Last Vitals:  Vitals:   05/29/16 0436 05/29/16 0600  BP:  (!) 98/53  Pulse:  (!) 57  Resp: 18 18  Temp: 36.6 C     Last Pain:  Vitals:   05/29/16 0641  TempSrc:   PainSc: 6                  Shakala Marlatt

## 2016-05-29 NOTE — Progress Notes (Signed)
Patient wanting to be discharged. Dr. Vanetta Shawl to come and speak to patient.

## 2016-05-29 NOTE — Care Management Note (Signed)
Case Management Note  Patient Details  Name: Kelly [name] MRN: [id number] Date of Birth: [birth year]  Subjective/Objective:                Securing outpt treatment at [facility].  Action/Plan: CSW Laurey Arrow) notified of consult from MD.  Expected Discharge Date:  05/30/16                Additional Comments: Case Manager called Dr. Vanetta Shawl to discuss another Pt and Dr. Vanetta Shawl stated that she had put a CM consult in for this Pt to be seen at [facility] for the outpt Methadone Clinic.  MD stated that she is aware that this can sometimes take a while before the Pt can be seen.  MD stated that this Pt needs to be seen asap and if needed she would get involved and speak with the treatment center.   CM explained that CSW is responsible for this referral and that I would make the CSW aware and to call her (MD  [phone]) if assistance is needed in getting the Pt in their treatment center asap.  Discharge could be as early as later today but more like tomorrow 05/30/16.  MD also needs to know if the Pt needs to take a Rx for the Methadone or will [name] take care of that.  CM informed MD that I would call CSW with the above information and that CSW would be in contact with her (MD).  MD very appreciative.  CM available to assist as needed.  Dicie Beam [place], [place] 05/29/2016, 10:12 AM

## 2016-05-29 NOTE — Discharge Summary (Signed)
Antenatal Physician Discharge Summary  Patient ID: Kelly [name] MRN: [id number] DOB/AGE: 12/30/1985 31 y.o.  Admit date: 05/24/2016 Discharge date: 05/29/2016  Admission Diagnoses: Pyelonephritis, pneumonia  ANTENATAL DISCHARGE SUMMARY  Discharge Diagnoses: Left AMA, community acquired pneumonia, cellulitis with retained needle in arm, polysubstance abuse, [redacted] weeks gestation  Prenatal Procedures: NST and ultrasound and prenatal labs  Intrapartum Procedures: Neonatology, Maternal Fetal Medicine Surgery  Significant Diagnostic Studies:  Results for orders placed or performed during the hospital encounter of 05/24/16 (from the past 168 hour(s))  CBC   Collection Time: 05/24/16  3:11 AM  Result Value Ref Range   WBC 17.1 (H) 4.0 - 10.5 K/uL   RBC 3.49 (L) 3.87 - 5.11 MIL/uL   Hemoglobin 10.5 (L) 12.0 - 15.0 g/dL   HCT 31.5 (L) 36.0 - 46.0 %   MCV 90.3 78.0 - 100.0 fL   MCH 30.1 26.0 - 34.0 pg   MCHC 33.3 30.0 - 36.0 g/dL   RDW 14.6 11.5 - 15.5 %   Platelets 461 (H) 150 - 400 K/uL  Basic metabolic panel   Collection Time: 05/24/16  3:11 AM  Result Value Ref Range   Sodium 133 (L) 135 - 145 mmol/L   Potassium 3.4 (L) 3.5 - 5.1 mmol/L   Chloride 99 (L) 101 - 111 mmol/L   CO2 22 22 - 32 mmol/L   Glucose, Bld 161 (H) 65 - 99 mg/dL   BUN <5 (L) 6 - 20 mg/dL   Creatinine, Ser 0.40 (L) 0.44 - 1.00 mg/dL   Calcium 9.1 8.9 - 10.3 mg/dL   GFR calc non Af Amer >60 >60 mL/min   GFR calc Af Amer >60 >60 mL/min   Anion gap 12 5 - 15  I-Stat Beta hCG blood, ED ([facility], [facility], [facility] only)   Collection Time: 05/24/16  3:21 AM  Result Value Ref Range   I-stat hCG, quantitative >2,000.0 (H) <5 mIU/mL   Comment 3          Urinalysis, Routine w reflex microscopic   Collection Time: 05/24/16  8:08 AM  Result Value Ref Range   Color, Urine AMBER (A) YELLOW   APPearance CLOUDY (A) CLEAR   Specific Gravity, Urine 1.016 1.005 - 1.030   pH 6.0 5.0 - 8.0   Glucose, UA 50 (A) NEGATIVE mg/dL   Hgb  urine dipstick NEGATIVE NEGATIVE   Bilirubin Urine NEGATIVE NEGATIVE   Ketones, ur 5 (A) NEGATIVE mg/dL   Protein, ur NEGATIVE NEGATIVE mg/dL   Nitrite NEGATIVE NEGATIVE   Leukocytes, UA MODERATE (A) NEGATIVE   RBC / HPF 0-5 0 - 5 RBC/hpf   WBC, UA TOO NUMEROUS TO COUNT 0 - 5 WBC/hpf   Bacteria, UA MANY (A) NONE SEEN   Squamous Epithelial / LPF 0-5 (A) NONE SEEN   WBC Clumps PRESENT    Mucous PRESENT   Urine culture   Collection Time: 05/24/16  8:44 AM  Result Value Ref Range   Specimen Description URINE, CLEAN CATCH    Special Requests NONE    Culture MULTIPLE SPECIES PRESENT, SUGGEST RECOLLECTION (A)    Report Status 05/25/2016 FINAL   Rapid urine drug screen (hospital performed)   Collection Time: 05/24/16 12:30 PM  Result Value Ref Range   Opiates POSITIVE (A) NONE DETECTED   Cocaine POSITIVE (A) NONE DETECTED   Benzodiazepines NONE DETECTED NONE DETECTED   Amphetamines NONE DETECTED NONE DETECTED   Tetrahydrocannabinol NONE DETECTED NONE DETECTED   Barbiturates NONE DETECTED NONE DETECTED  Hepatitis  B surface antigen   Collection Time: 05/24/16  1:53 PM  Result Value Ref Range   Hepatitis B Surface Ag Negative Negative  Rubella screen   Collection Time: 05/24/16  1:54 PM  Result Value Ref Range   Rubella <0.90 (L) Immune >0.99 index  RPR   Collection Time: 05/24/16  1:54 PM  Result Value Ref Range   RPR Ser Ql Non Reactive Non Reactive  CBC   Collection Time: 05/24/16  1:54 PM  Result Value Ref Range   WBC 13.5 (H) 4.0 - 10.5 K/uL   RBC 3.00 (L) 3.87 - 5.11 MIL/uL   Hemoglobin 9.4 (L) 12.0 - 15.0 g/dL   HCT 26.7 (L) 36.0 - 46.0 %   MCV 89.0 78.0 - 100.0 fL   MCH 31.3 26.0 - 34.0 pg   MCHC 35.2 30.0 - 36.0 g/dL   RDW 14.6 11.5 - 15.5 %   Platelets 453 (H) 150 - 400 K/uL  Differential   Collection Time: 05/24/16  1:54 PM  Result Value Ref Range   Neutrophils Relative % 86 %   Neutro Abs 11.5 (H) 1.7 - 7.7 K/uL   Lymphocytes Relative 12 %   Lymphs Abs 1.6  0.7 - 4.0 K/uL   Monocytes Relative 2 %   Monocytes Absolute 0.3 0.1 - 1.0 K/uL   Eosinophils Relative 0 %   Eosinophils Absolute 0.0 0.0 - 0.7 K/uL   Basophils Relative 0 %   Basophils Absolute 0.0 0.0 - 0.1 K/uL  HIV antibody (routine testing)   Collection Time: 05/24/16  1:54 PM  Result Value Ref Range   HIV Screen 4th Generation wRfx Non Reactive Non Reactive  Hemoglobin A1c   Collection Time: 05/24/16  1:54 PM  Result Value Ref Range   Hgb A1c MFr Bld 4.9 4.8 - 5.6 %   Mean Plasma Glucose 94 mg/dL  Hepatitis c antibody (reflex)   Collection Time: 05/24/16  2:12 PM  Result Value Ref Range   HCV Ab >11.0 (H) 0.0 - 0.9 s/co ratio  Comment2 - Hep panel   Collection Time: 05/24/16  2:12 PM  Result Value Ref Range   COMMENT HCV-2 Comment   Type and screen [place]   Collection Time: 05/24/16  2:12 PM  Result Value Ref Range   ABO/RH(D) O POS    Antibody Screen NEG    Sample Expiration 05/27/2016   Influenza panel by PCR (type A & B, H1N1)   Collection Time: 05/25/16  8:00 PM  Result Value Ref Range   Influenza A By PCR NEGATIVE NEGATIVE   Influenza B By PCR NEGATIVE NEGATIVE    Treatments: MEDS: antibiotics: vancomycin, ceftriaxone and azithromycin and methadone was started, clonidine for withdrawal symptom relief;  Foreign body removal  Hospital Course:  This is a 31 y.o. [id number] with IUP at 76w1dadmitted for pneumonia and right sided pyelonephritis, and for opiate withdrawal. Patient is a known IV drug user, who admits to using IV heroin laced with fentanyl daily. Patient was admitted for pyelonephritis (leukocytosis, UA+, CVAT) and possible pneumonia. Patient was given IV antibiotics during stay with improvement of symptoms. She was found to have a 875mneedle in her distal right forearm that was surgically removed during her hospital stay. Patient was also started on methadone treatment and was [facility].   Tuesday afternoon, patient chose to leave AMA despite speaking with the  physician regarding why remaining in the hospital would be more beneficial to the patient's health and leaving would be against medical advice. Patient stated the reason she needed to leave was she "needed to get things done". When asked what these things were, she mentioned to get her ID from her mother in order to make it to [facility] for her methadone. When asked if there was anything else, she said no, and refused any help from social work or her friend at her bedside. Despite offering to have patient ready to leave early Wednesday morning, she decided to leave AMA.    Discharge Exam: BP 117/74 (BP Location: Left Arm)   Pulse 66   Temp 98.6 F (37 C) (Oral)   Resp 18   Ht 5' 7"  (1.702 m)   Wt 180 lb (81.6 kg)   LMP  (LMP Unknown) Comment: pt refused to answer what her due date is  SpO2 100%   BMI 28.19 kg/m  General appearance: alert, no distress, uncooperative and anxious Resp: clear to auscultation bilaterally Cardio: regular rate and rhythm, S1, S2 normal, no murmur, click, rub or gallop GI: soft, non-tender; bowel sounds normal; no masses,  no organomegaly and gravid appropriate for gestational age Extremities: Homans sign is negative, no sign of DVT, no edema, redness or tenderness in the calves or thighs and bandage covering right arm from foreign body removal, clean, dry.   Discharge Condition: fair, but stable.   Disposition: 07-Left Against Medical Advice/Left Without Being Seen/Elopement   Allergies as of 05/29/2016      Reactions   Hydrocodone Itching   Morphine And Related Nausea And Vomiting      Medication List    ASK your doctor about these medications   acetaminophen 325 MG tablet Commonly known as:  TYLENOL Take 2 tablets (650 mg total) by mouth every 6 (six) hours as needed for mild pain (or Temp > 100).   ibuprofen 600 MG  tablet Commonly known as:  ADVIL,MOTRIN Take 1 tablet (600 mg total) by mouth every 6 (six) hours as needed.   oxyCODONE-acetaminophen 10-325 MG tablet Commonly known as:  PERCOCET Take 1 tablet by mouth every 4 (four) hours as needed (severe pain).   sulfamethoxazole-trimethoprim 800-160 MG tablet Commonly known as:  BACTRIM DS,SEPTRA DS Take 1 tablet by mouth 2 (two) times daily.        Signed: Katherine Basset, DO OB Fellow 05/29/2016, 9:13 PM

## 2016-05-29 NOTE — Op Note (Signed)
NAMESELICIA, WINDOM NO.:  [id number]  MEDICAL RECORD NO.:  [id number]  LOCATION:  [facility]                         FACILITY:  [facility]  PHYSICIAN:  [name], M.D.    DATE OF BIRTH:  1985-07-12  DATE OF PROCEDURE:  05/28/2016 DATE OF DISCHARGE:                              OPERATIVE REPORT   PREOPERATIVE DIAGNOSIS:  Retained deep foreign body, right arm.  POSTOPERATIVE DIAGNOSIS:  Retained deep foreign body, right arm.  PROCEDURE:  Right arm foreign body removal with some excisional debridement of granulation type tissue.  SURGEON:  [name], M.D.  ASSISTANT:  None.  ANESTHESIA:  Regional.  INDICATIONS:  Cardelia is a 31 year old patient who injected herself with a needle 4 months ago.  The needle stuck in the skin, she could not get it out, and now it is becoming inflamed.  She presents now for operative management after explanation of risks and benefits.  PROCEDURE IN DETAIL:  The patient was brought to the operating room where regional anesthetic was induced.  Perioperative IV antibiotics were continued.  Time-out was called.  Right arm was pre-scrubbed with alcohol, then prepped with CHG, draped in a sterile manner.  Under fluoroscopic guidance and with the tourniquet inflated, about a 2.5 cm incision was made over the anticipated location of the needle tip.  There was some soft tissue which had metallic debris.  This was excised.  The needle tip was localized within a significant area of granulation tissue which was removed along with the needle tip. Thorough irrigation was performed.  Tourniquet released.  Bleeding points encountered were controlled using bipolar electrocautery.  Care was taken to try to avoid injury to superficial radial nerve and its branches.  Loose approximation using 2 simple nylon sutures was performed.  Efflux from the incision was visualized.  Bulky dressing placed.  The patient tolerated the procedure well without  immediate complications.  She will be discharged back to [facility].  Follow up with me in 10 days.     [name], M.D.     GSD/MEDQ  D:  05/28/2016  T:  05/29/2016  Job:  [id number]

## 2016-05-29 NOTE — Progress Notes (Signed)
Dr. Vanetta Shawl at bedside discussing plan of care with patient and friend. Patient requesting to be discharged. Dr. Vanetta Shawl discussed plan of care and advised against leaving AMA. Patient states will think it over let nurse know.

## 2016-05-29 NOTE — Progress Notes (Signed)
CSW met with patient to give patient information for walk-in clinic at [facility].  Patient was informed to bring patient's ID (Patient's friend William will assist patient financially with obtaining an ID). Patient was also informed to bring $14.00, and to arrive at 5am.  CSW contacted Financial Counseling Dept ([facility] [place]) to assist patient with applying for Medicaid. Patient is aware that patient needs to plan to stay for the appointment for 5-6 hours. Patient was provided with clinic contact information.   B[place], MSW, LCSW Clinical Social Work [phone]  

## 2016-05-29 NOTE — Progress Notes (Signed)
Patient back from wheelchair ride with friend. Upon arrival to room, patient had removed bandage from right arm (surgical incision site), states she just wanted to show him what it looked like. Clean 4x4s applied to incision site. Reapplied bandage.

## 2016-06-06 ENCOUNTER — Telehealth: Payer: Self-pay | Admitting: Clinical

## 2016-06-06 NOTE — Telephone Encounter (Signed)
[facility] Pregnancy Care Manager, Cathlean Sauer, is requesting a call to [phone], if patient, Kelly [name], shows up to any OB/GYN office for prenatal care, so that patient may receive case management/pregnancy care services, along with postpartum home visits.   Cathlean Sauer states she has been unsuccessful in her efforts to locate Kelly [name].

## 2016-06-25 DIAGNOSIS — M60221 Foreign body granuloma of soft tissue, not elsewhere classified, right upper arm: Secondary | ICD-10-CM

## 2016-07-26 ENCOUNTER — Encounter (HOSPITAL_COMMUNITY): Payer: Self-pay

## 2016-07-26 ENCOUNTER — Inpatient Hospital Stay (HOSPITAL_COMMUNITY)
Admission: AD | Admit: 2016-07-26 | Discharge: 2016-07-28 | DRG: 774 | Disposition: A | Discharge status: Home / Self Care | Payer: Medicaid Other | Source: Ambulatory Visit | Attending: Obstetrics & Gynecology | Admitting: Obstetrics & Gynecology

## 2016-07-26 DIAGNOSIS — O99334 Smoking (tobacco) complicating childbirth: Secondary | ICD-10-CM | POA: Diagnosis present

## 2016-07-26 DIAGNOSIS — O1002 Pre-existing essential hypertension complicating childbirth: Secondary | ICD-10-CM | POA: Diagnosis present

## 2016-07-26 DIAGNOSIS — O99324 Drug use complicating childbirth: Secondary | ICD-10-CM | POA: Diagnosis present

## 2016-07-26 DIAGNOSIS — F1120 Opioid dependence, uncomplicated: Secondary | ICD-10-CM | POA: Diagnosis present

## 2016-07-26 DIAGNOSIS — F1410 Cocaine abuse, uncomplicated: Secondary | ICD-10-CM | POA: Diagnosis present

## 2016-07-26 DIAGNOSIS — M419 Scoliosis, unspecified: Secondary | ICD-10-CM | POA: Diagnosis present

## 2016-07-26 DIAGNOSIS — Z3493 Encounter for supervision of normal pregnancy, unspecified, third trimester: Secondary | ICD-10-CM | POA: Diagnosis present

## 2016-07-26 DIAGNOSIS — Z3A38 38 weeks gestation of pregnancy: Secondary | ICD-10-CM

## 2016-07-26 DIAGNOSIS — F1420 Cocaine dependence, uncomplicated: Secondary | ICD-10-CM

## 2016-07-26 DIAGNOSIS — F17210 Nicotine dependence, cigarettes, uncomplicated: Secondary | ICD-10-CM | POA: Diagnosis present

## 2016-07-26 DIAGNOSIS — O34211 Maternal care for low transverse scar from previous cesarean delivery: Secondary | ICD-10-CM | POA: Diagnosis present

## 2016-07-26 LAB — COMPREHENSIVE METABOLIC PANEL
ALK PHOS: 134 U/L — AB (ref 38–126)
ALT: 6 U/L — AB (ref 14–54)
AST: 16 U/L (ref 15–41)
Albumin: 2.6 g/dL — ABNORMAL LOW (ref 3.5–5.0)
Anion gap: 10 (ref 5–15)
BUN: 10 mg/dL (ref 6–20)
CALCIUM: 8.3 mg/dL — AB (ref 8.9–10.3)
CO2: 21 mmol/L — ABNORMAL LOW (ref 22–32)
CREATININE: 0.66 mg/dL (ref 0.44–1.00)
Chloride: 102 mmol/L (ref 101–111)
Glucose, Bld: 94 mg/dL (ref 65–99)
Potassium: 3.3 mmol/L — ABNORMAL LOW (ref 3.5–5.1)
Sodium: 133 mmol/L — ABNORMAL LOW (ref 135–145)
TOTAL PROTEIN: 6.9 g/dL (ref 6.5–8.1)
Total Bilirubin: 0.4 mg/dL (ref 0.3–1.2)

## 2016-07-26 LAB — CBC
HCT: 33.1 % — ABNORMAL LOW (ref 36.0–46.0)
HEMOGLOBIN: 11.1 g/dL — AB (ref 12.0–15.0)
MCH: 30.4 pg (ref 26.0–34.0)
MCHC: 33.5 g/dL (ref 30.0–36.0)
MCV: 90.7 fL (ref 78.0–100.0)
PLATELETS: 337 10*3/uL (ref 150–400)
RBC: 3.65 MIL/uL — AB (ref 3.87–5.11)
RDW: 14.4 % (ref 11.5–15.5)
WBC: 17.9 10*3/uL — ABNORMAL HIGH (ref 4.0–10.5)

## 2016-07-26 LAB — RAPID URINE DRUG SCREEN, HOSP PERFORMED
Amphetamines: POSITIVE — AB
BENZODIAZEPINES: POSITIVE — AB
Barbiturates: NOT DETECTED
COCAINE: POSITIVE — AB
Opiates: POSITIVE — AB
Tetrahydrocannabinol: NOT DETECTED

## 2016-07-26 MED ORDER — SIMETHICONE 80 MG PO CHEW: 80.0000 mg | CHEWABLE_TABLET | ORAL | Status: DC | PRN

## 2016-07-26 MED ORDER — SOD CITRATE-CITRIC ACID 500-334 MG/5ML PO SOLN: 30.0000 mL | ORAL | Status: DC | PRN

## 2016-07-26 MED ORDER — ONDANSETRON HCL 4 MG PO TABS
4.0000 mg | ORAL_TABLET | ORAL | Status: DC | PRN
Start: 2016-07-26 — End: 2016-07-28

## 2016-07-26 MED ORDER — DIPHENHYDRAMINE HCL 25 MG PO CAPS: 25.0000 mg | ORAL_CAPSULE | Freq: Four times a day (QID) | ORAL | Status: DC | PRN

## 2016-07-26 MED ORDER — TETANUS-DIPHTH-ACELL PERTUSSIS 5-2.5-18.5 LF-MCG/0.5 IM SUSP: 0.5000 mL | Freq: Once | INTRAMUSCULAR | Status: DC

## 2016-07-26 MED ORDER — DIBUCAINE 1 % RE OINT: 1.0000 "application " | TOPICAL_OINTMENT | RECTAL | Status: DC | PRN

## 2016-07-26 MED ORDER — IBUPROFEN 600 MG PO TABS: 600.0000 mg | ORAL_TABLET | Freq: Four times a day (QID) | ORAL | Status: DC

## 2016-07-26 MED ORDER — COCONUT OIL OIL: 1.0000 "application " | TOPICAL_OIL | Status: DC | PRN

## 2016-07-26 MED ORDER — ACETAMINOPHEN 325 MG PO TABS
650.0000 mg | ORAL_TABLET | ORAL | Status: DC | PRN
  Administered 2016-07-28 (×2): 650 mg via ORAL
  Filled 2016-07-26 (×2): qty 2

## 2016-07-26 MED ORDER — ZOLPIDEM TARTRATE 5 MG PO TABS: 5.0000 mg | ORAL_TABLET | Freq: Every evening | ORAL | Status: DC | PRN

## 2016-07-26 MED ORDER — LACTATED RINGERS IV SOLN
INTRAVENOUS | Status: DC
  Administered 2016-07-26: 01:00:00 via INTRAVENOUS

## 2016-07-26 MED ORDER — ONDANSETRON HCL 4 MG PO TABS: 4.0000 mg | ORAL_TABLET | ORAL | Status: DC | PRN

## 2016-07-26 MED ORDER — LIDOCAINE HCL (PF) 1 % IJ SOLN
30.0000 mL | INTRAMUSCULAR | Status: DC | PRN
  Filled 2016-07-26: qty 30

## 2016-07-26 MED ORDER — BUPRENORPHINE HCL-NALOXONE HCL 8-2 MG SL SUBL: 1.0000 | SUBLINGUAL_TABLET | Freq: Two times a day (BID) | SUBLINGUAL | Status: DC

## 2016-07-26 MED ORDER — ONDANSETRON HCL 4 MG/2ML IJ SOLN: 4.0000 mg | INTRAMUSCULAR | Status: DC | PRN

## 2016-07-26 MED ORDER — WITCH HAZEL-GLYCERIN EX PADS: 1.0000 "application " | MEDICATED_PAD | CUTANEOUS | Status: DC | PRN

## 2016-07-26 MED ORDER — BENZOCAINE-MENTHOL 20-0.5 % EX AERO: 1.0000 "application " | INHALATION_SPRAY | CUTANEOUS | Status: DC | PRN

## 2016-07-26 MED ORDER — IBUPROFEN 600 MG PO TABS
600.0000 mg | ORAL_TABLET | Freq: Four times a day (QID) | ORAL | Status: DC
  Administered 2016-07-26 – 2016-07-28 (×9): 600 mg via ORAL
  Filled 2016-07-26 (×10): qty 1

## 2016-07-26 MED ORDER — SENNOSIDES-DOCUSATE SODIUM 8.6-50 MG PO TABS
2.0000 | ORAL_TABLET | ORAL | Status: DC
  Administered 2016-07-26 – 2016-07-28 (×2): 2 via ORAL
  Filled 2016-07-26 (×2): qty 2

## 2016-07-26 MED ORDER — ACETAMINOPHEN 325 MG PO TABS: 650.0000 mg | ORAL_TABLET | ORAL | Status: DC | PRN

## 2016-07-26 MED ORDER — OXYTOCIN BOLUS FROM INFUSION
500.0000 mL | Freq: Once | INTRAVENOUS | Status: AC
  Administered 2016-07-26: 500 mL via INTRAVENOUS

## 2016-07-26 MED ORDER — SIMETHICONE 80 MG PO CHEW
80.0000 mg | CHEWABLE_TABLET | ORAL | Status: DC | PRN
Start: 2016-07-26 — End: 2016-07-28

## 2016-07-26 MED ORDER — ERYTHROMYCIN 5 MG/GM OP OINT
TOPICAL_OINTMENT | OPHTHALMIC | Status: AC
  Filled 2016-07-26: qty 1

## 2016-07-26 MED ORDER — SENNOSIDES-DOCUSATE SODIUM 8.6-50 MG PO TABS: 2.0000 | ORAL_TABLET | ORAL | Status: DC

## 2016-07-26 MED ORDER — METHADONE HCL 10 MG PO TABS
80.0000 mg | ORAL_TABLET | Freq: Every day | ORAL | Status: DC
  Administered 2016-07-26 – 2016-07-28 (×3): 80 mg via ORAL
  Filled 2016-07-26 (×3): qty 8

## 2016-07-26 MED ORDER — OXYTOCIN 40 UNITS IN LACTATED RINGERS INFUSION - SIMPLE MED: 2.5000 [IU]/h | INTRAVENOUS | Status: DC

## 2016-07-26 MED ORDER — PRENATAL MULTIVITAMIN CH
1.0000 | ORAL_TABLET | Freq: Every day | ORAL | Status: DC
  Administered 2016-07-26 – 2016-07-28 (×3): 1 via ORAL
  Filled 2016-07-26 (×4): qty 1

## 2016-07-26 MED ORDER — LACTATED RINGERS IV SOLN: 500.0000 mL | INTRAVENOUS | Status: DC | PRN

## 2016-07-26 MED ORDER — PRENATAL MULTIVITAMIN CH: 1.0000 | ORAL_TABLET | Freq: Every day | ORAL | Status: DC

## 2016-07-26 NOTE — Progress Notes (Signed)
CPS report made to [facility].

## 2016-07-26 NOTE — Plan of Care (Signed)
Problem: Safety: Goal: Ability to remain free from injury will improve Outcome: Progressing Pt drowsy, instructed to call before getting out of bed.  SR up x 2.  Problem: Pain Managment: Goal: General experience of comfort will improve Outcome: Progressing Discussed pain meds available

## 2016-07-26 NOTE — Plan of Care (Signed)
Problem: Coping: Goal: Ability to cope will improve Patient sleeping when assessed. Answers questions appropriately but very groggy .  States is having vaginal pain but then drifts back off to sleep

## 2016-07-26 NOTE — H&P (Signed)
LABOR AND DELIVERY ADMISSION HISTORY AND PHYSICAL NOTE  Kelly [name] is a 31 y.o. female [phone] with IUP at 78w3dby LMP presenting for SOL. Pt states contractions began while at home earlier this morning but could not recall what time. She Denies leakage or bleeding. Endorses good fetal movement. Pt with no prenatal care during pregnancy. Endorses use of heroin laced with fentanyl, cocaine and smoking during pregnancy. Pt presented to MAU completely dilated is severe distress and was admitted to labor and delivery.  Prenatal History/Complications: Patient Active Problem List   Diagnosis Date Noted  . Normal labor 07/26/2016  . Foreign body granuloma of soft tissue, NEC, right upper arm   . Pyelonephritis 05/28/2016  . Polysubstance abuse 05/25/2016  . No prenatal care in current pregnancy 05/25/2016  . Chronic hypertension affecting pregnancy 05/25/2016  . History of cesarean delivery affecting pregnancy 05/25/2016  . Community acquired pneumonia 05/25/2016  . Pyelonephritis affecting pregnancy 05/24/2016  . Palliative care encounter   . Abscess of right breast 07/13/2014  . Opioid dependence ([place] 08/25/2013  . Unspecified episodic mood disorder 08/25/2013  . ADHD (attention deficit hyperactivity disorder) 08/25/2013  . Cocaine abuse 08/25/2013  . Thoracic back pain 01/29/2013  . CROHN'S DISEASE 05/03/2005  . COLONIC POLYPS 02/28/2003     Past Medical History: Past Medical History:  Diagnosis Date  . Abscess of breast    rt breast  . ADD (attention deficit disorder)   . Back pain   . Bipolar 1 disorder ([facility]   . Hypertension   . Polysubstance abuse   . Suicidal overdose ([facility]     Past Surgical History: Past Surgical History:  Procedure Laterality Date  . BACK SURGERY     scoliosis-in care everywhere 08/1999  . CESAREAN SECTION    . CHOLECYSTECTOMY  March 2015  . FOREIGN BODY REMOVAL Right 05/28/2016   Procedure: REMOVAL FOREIGN BODY RIGHT ARM;  Surgeon: SMeredith Pel MD;  [place]  Service: Orthopedics;  Laterality: Right;  . IRRIGATION AND DEBRIDEMENT ABSCESS Right 07/13/2014   Procedure: IRRIGATION AND DEBRIDEMEN TRIGHT BREAST ABSCESS;  Surgeon: MDonnie Mesa MD;  [place] [facility]  Service: General;  Laterality: Right;    Obstetrical History: OB History    Gravida Para Term Preterm AB Living   5 1 1   3 1    SAB TAB Ectopic Multiple Live Births   3       1      Social History: Social History   Social History  . Marital status: Single    Spouse name: N/A  . Number of children: N/A  . Years of education: N/A   Social History Main Topics  . Smoking status: Current Every Day Smoker    Packs/day: 1.00    Years: 13.00    Types: Cigarettes  . Smokeless tobacco: Never Used  . Alcohol use Yes     Comment: one or two drinks weekly  . Drug use: Yes    Types: Cocaine, Heroin, IV     Comment: heroin, crack cocaine  . Sexual activity: Yes    Birth control/ protection: Condom   Other Topics Concern  . None   Social History Narrative  . None    Family History: Family History  Problem Relation Age of Onset  . Diabetes Mother   . Diabetes Father   . Hypertension Father   . Heart attack Neg Hx   . Hyperlipidemia Neg Hx   . Sudden death Neg Hx  Allergies: Allergies  Allergen Reactions  . Hydrocodone Itching  . Morphine And Related Nausea And Vomiting    Prescriptions Prior to Admission  Medication Sig Dispense Refill Last Dose  . acetaminophen (TYLENOL) 325 MG tablet Take 2 tablets (650 mg total) by mouth every 6 (six) hours as needed for mild pain (or Temp > 100). (Patient not taking: Reported on 05/24/2016)   Not Taking at Unknown time  . ibuprofen (ADVIL,MOTRIN) 600 MG tablet Take 1 tablet (600 mg total) by mouth every 6 (six) hours as needed. (Patient not taking: Reported on 05/24/2016) 60 tablet 0 Completed Course at Unknown time  . oxyCODONE-acetaminophen (PERCOCET) 10-325 MG tablet Take 1 tablet by mouth  every 4 (four) hours as needed (severe pain). (Patient not taking: Reported on 05/24/2016) 30 tablet 0 Completed Course at Unknown time  . sulfamethoxazole-trimethoprim (BACTRIM DS,SEPTRA DS) 800-160 MG tablet Take 1 tablet by mouth 2 (two) times daily. (Patient not taking: Reported on 05/24/2016) 20 tablet 1 Completed Course at Unknown time     Review of Systems   All systems reviewed and negative except as stated in HPI  There were no vitals taken for this visit. General appearance: alert, cooperative and severe distress Lungs: clear to auscultation bilaterally Heart: regular rate and rhythm Abdomen: soft, non-tender; bowel sounds normal Extremities: No calf swelling or tenderness Presentation: cephalic Fetal monitoring: FHR 140 bpm, moderate variability, accelerations present, no decelerations Uterine activity: regular q 2 mins     Prenatal labs: ABO, Rh: --/--/O POS (01/11 1412) Antibody: NEG (01/11 1412) Rubella: !Error! RPR: Non Reactive (01/11 1354)  HBsAg: Negative (01/11 1353)  HIV: Non Reactive (01/11 1354)  GBS:    1 hr Glucola: Not performed Genetic screening:  Not performed Anatomy US: Not performed  Prenatal Transfer Tool  Maternal Diabetes: Not performed Genetic Screening: Not performed Maternal Ultrasounds/Referrals: Not performed Fetal Ultrasounds or other Referrals:  Not performed Maternal Substance Abuse:  Heronin laced with fentanyl, heroin, cocaine Significant Maternal Medications:  None Significant Maternal Lab Results: GBS unknown  No results found for this or any previous visit (from the past 24 hour(s)).  Patient Active Problem List   Diagnosis Date Noted  . Foreign body granuloma of soft tissue, NEC, right upper arm   . Pyelonephritis 05/28/2016  . Polysubstance abuse 05/25/2016  . No prenatal care in current pregnancy 05/25/2016  . Chronic hypertension affecting pregnancy 05/25/2016  . History of cesarean delivery affecting pregnancy  05/25/2016  . Community acquired pneumonia 05/25/2016  . Pyelonephritis affecting pregnancy 05/24/2016  . Palliative care encounter   . Abscess of right breast 07/13/2014  . Opioid dependence ([place]) 08/25/2013  . Unspecified episodic mood disorder 08/25/2013  . ADHD (attention deficit hyperactivity disorder) 08/25/2013  . Cocaine abuse 08/25/2013  . Thoracic back pain 01/29/2013  . CROHN'S DISEASE 05/03/2005  . COLONIC POLYPS 02/28/2003    Assessment: Kelly [name] is a 31 y.o. [id number] at 70w3dhere for SOL.  #Labor:SOL #Pain: IV pain meds #FWB: Category I #ID:  GBS unkown #MOF: Bottle #MOC:Undecided #Circ:  Yes  DRandolph Bing DO, PGY-1 07/26/2016, 1:14 AM   CNM attestation:  I have seen and examined this patient; I agree with above documentation in the resident's note.   Kelly NDOROTHEY OETKENis a 31y.o. [id number] for SOL  PE: BP 115/62 (BP Location: Left Arm)   Pulse (!) 53   Temp 98.4 F (36.9 C) (Oral)   Resp 20   Ht 5' 7"  (1.702 m)  Wt 77.1 kg (170 lb)   LMP  (LMP Unknown) Comment: pt refused to answer what her due date is  SpO2 97%   BMI 26.63 kg/m  Gen: calm comfortable, NAD Resp: normal effort, no distress Abd: gravid  ROS, labs, PMH reviewed  Plan: Admit to [facility] Expectant management- anticipate SVD Plan SW/CPS eval while inpt  Tanijah Morais CNM 07/27/2016, 6:24 PM

## 2016-07-26 NOTE — MAU Note (Signed)
Called by MAU secretary, pt. Here with SROM and stated "there is green fluid coming out of me".   RN transferred pt. Via WC to MAU room #7, pt. Crying. Patient states she has had only one prenatal visit (last week). Pt. Was dropped off by two females.  They left the building.  Pain 10/10, attached patient to EFM and Toco, FHR - 130s, abdomen palpated..contraction noted. SVE - Complete, +1, with Green fluid leaking. Another RN called Camera operator in [place]; room given [phone].  Lelan Pons called to MAU for support while transferring patient to L&D via stretcher. Pt. Transferred to L&D safely.  Report given to L&D nurse.

## 2016-07-26 NOTE — Progress Notes (Signed)
CSW attempted to meet with [name] in her first floor room/108 to complete assessment due to [facility], polysubstance use, and NICU admission, but she was not in her room at this time.  CSW then saw patient walk into 108 while standing at RN station.  CSW went back to room to introduce services and complete assessment and [name] stated that she has visitors coming and would like CSW to return in the morning.  CSW agreed.  CSW left message for [facility] CPS Intake to make report based on baby's positive UDS for cocaine and opiates.

## 2016-07-27 MED ORDER — MEASLES, MUMPS & RUBELLA VAC ~~LOC~~ INJ: 0.5000 mL | INJECTION | Freq: Once | SUBCUTANEOUS | Status: DC

## 2016-07-27 MED ORDER — MEASLES, MUMPS & RUBELLA VAC ~~LOC~~ INJ
0.5000 mL | INJECTION | Freq: Once | SUBCUTANEOUS | Status: DC
  Filled 2016-07-27 (×2): qty 0.5

## 2016-07-27 NOTE — Progress Notes (Signed)
CSW attempted for the fourth time to meet with [name] to complete assessment.  She was sleeping and CSW was not able to wake her.  CSW received call from CPS stating he will be here to meet with [name] at 2pm.  He states he attempted also to meet with her and she stated she was sleeping and asked him to return.  CSW will continue to monitor and be involved to ensure CPS disposition plan is known and followed, however, CSW does not plan to attempt further to complete assessment with [name].  CSW is available for support/assistance to family as needed/desired.

## 2016-07-27 NOTE — Progress Notes (Signed)
Post Partum Day#1 Subjective: no complaints, up ad lib, voiding and tolerating PO  Objective: Blood pressure 115/62, pulse (!) 53, temperature 98.1 F (36.7 C), temperature source Oral, resp. rate 20, height 5' 7"  (1.702 m), weight 77.1 kg (170 lb), SpO2 97 %.  Physical Exam:  General: alert Lochia: appropriate Uterine Fundus: firm DVT Evaluation: No evidence of DVT seen on physical exam.   Recent Labs  07/26/16 0236  HGB 11.1*  HCT 33.1*    Assessment/Plan: Plan for discharge tomorrow   LOS: 1 day   [place] 07/27/2016, 6:40 AM

## 2016-07-27 NOTE — Progress Notes (Signed)
CPS case assigned to R. [phone].  CPS Intake staff informed CSW that Extended Services worker K. Ricard Dillon came to the hospital last night to initiate case with [name] and had a "difficult time waking up [name]."  CSW will follow up with Mr. Sherral Hammers.

## 2016-07-27 NOTE — Progress Notes (Signed)
CSW attempted to meet with [name] to complete assessment, but she asked for CSW to return "around lunch time" because she was sleeping.  CSW offered to return in a couple hours.  [name] agreed.  CSW has left message for CPS Intake to inquire as to who has been assigned to this case.

## 2016-07-28 ENCOUNTER — Encounter (HOSPITAL_COMMUNITY): Payer: Self-pay | Admitting: Emergency Medicine

## 2016-07-28 ENCOUNTER — Emergency Department (HOSPITAL_COMMUNITY)
Admission: EM | Admit: 2016-07-28 | Discharge: 2016-07-31 | Disposition: A | Discharge status: Other Acute Inpatient Hospital | Payer: Medicaid Other | Attending: Emergency Medicine | Admitting: Emergency Medicine

## 2016-07-28 DIAGNOSIS — I10 Essential (primary) hypertension: Secondary | ICD-10-CM | POA: Insufficient documentation

## 2016-07-28 DIAGNOSIS — F909 Attention-deficit hyperactivity disorder, unspecified type: Secondary | ICD-10-CM | POA: Insufficient documentation

## 2016-07-28 DIAGNOSIS — F1120 Opioid dependence, uncomplicated: Secondary | ICD-10-CM | POA: Diagnosis present

## 2016-07-28 DIAGNOSIS — F339 Major depressive disorder, recurrent, unspecified: Secondary | ICD-10-CM | POA: Diagnosis present

## 2016-07-28 DIAGNOSIS — R45851 Suicidal ideations: Secondary | ICD-10-CM

## 2016-07-28 DIAGNOSIS — Z79899 Other long term (current) drug therapy: Secondary | ICD-10-CM | POA: Insufficient documentation

## 2016-07-28 DIAGNOSIS — F1910 Other psychoactive substance abuse, uncomplicated: Secondary | ICD-10-CM

## 2016-07-28 DIAGNOSIS — F418 Other specified anxiety disorders: Secondary | ICD-10-CM | POA: Insufficient documentation

## 2016-07-28 DIAGNOSIS — F17210 Nicotine dependence, cigarettes, uncomplicated: Secondary | ICD-10-CM | POA: Insufficient documentation

## 2016-07-28 LAB — RAPID URINE DRUG SCREEN, HOSP PERFORMED
Amphetamines: NOT DETECTED
Barbiturates: NOT DETECTED
Benzodiazepines: NOT DETECTED
Cocaine: POSITIVE — AB
OPIATES: POSITIVE — AB
Tetrahydrocannabinol: NOT DETECTED

## 2016-07-28 LAB — COMPREHENSIVE METABOLIC PANEL
ALT: 7 U/L — ABNORMAL LOW (ref 14–54)
AST: 17 U/L (ref 15–41)
Albumin: 2.6 g/dL — ABNORMAL LOW (ref 3.5–5.0)
Alkaline Phosphatase: 110 U/L (ref 38–126)
Anion gap: 11 (ref 5–15)
BILIRUBIN TOTAL: 0.3 mg/dL (ref 0.3–1.2)
BUN: 16 mg/dL (ref 6–20)
CHLORIDE: 108 mmol/L (ref 101–111)
CO2: 19 mmol/L — ABNORMAL LOW (ref 22–32)
CREATININE: 0.76 mg/dL (ref 0.44–1.00)
Calcium: 8.6 mg/dL — ABNORMAL LOW (ref 8.9–10.3)
Glucose, Bld: 75 mg/dL (ref 65–99)
POTASSIUM: 3.9 mmol/L (ref 3.5–5.1)
Sodium: 138 mmol/L (ref 135–145)
TOTAL PROTEIN: 6.7 g/dL (ref 6.5–8.1)

## 2016-07-28 LAB — ACETAMINOPHEN LEVEL: Acetaminophen (Tylenol), Serum: <10 ug/mL — ABNORMAL LOW (ref 10–30)

## 2016-07-28 LAB — CBC
HCT: 25.3 % — ABNORMAL LOW (ref 36.0–46.0)
Hemoglobin: 8.6 g/dL — ABNORMAL LOW (ref 12.0–15.0)
MCH: 30.4 pg (ref 26.0–34.0)
MCHC: 34 g/dL (ref 30.0–36.0)
MCV: 89.4 fL (ref 78.0–100.0)
PLATELETS: 243 10*3/uL (ref 150–400)
RBC: 2.83 MIL/uL — AB (ref 3.87–5.11)
RDW: 14.2 % (ref 11.5–15.5)
WBC: 10.4 10*3/uL (ref 4.0–10.5)

## 2016-07-28 LAB — ETHANOL

## 2016-07-28 LAB — SALICYLATE LEVEL

## 2016-07-28 LAB — RPR: RPR Ser Ql: NONREACTIVE

## 2016-07-28 MED ORDER — LOPERAMIDE HCL 2 MG PO CAPS: 2.0000 mg | ORAL_CAPSULE | ORAL | Status: DC | PRN

## 2016-07-28 MED ORDER — ONDANSETRON 4 MG PO TBDP
4.0000 mg | ORAL_TABLET | Freq: Four times a day (QID) | ORAL | Status: DC | PRN
  Administered 2016-07-28 – 2016-07-30 (×4): 4 mg via ORAL
  Filled 2016-07-28 (×5): qty 1

## 2016-07-28 MED ORDER — MEDROXYPROGESTERONE ACETATE 150 MG/ML IM SUSP: 150.0000 mg | Freq: Once | INTRAMUSCULAR | Status: DC

## 2016-07-28 MED ORDER — CLONIDINE HCL 0.1 MG PO TABS: 0.1000 mg | ORAL_TABLET | Freq: Every day | ORAL | Status: DC

## 2016-07-28 MED ORDER — DICYCLOMINE HCL 20 MG PO TABS
20.0000 mg | ORAL_TABLET | Freq: Four times a day (QID) | ORAL | Status: DC | PRN
  Administered 2016-07-29 – 2016-07-30 (×2): 20 mg via ORAL
  Filled 2016-07-28 (×4): qty 1

## 2016-07-28 MED ORDER — HYDROXYZINE HCL 25 MG PO TABS
25.0000 mg | ORAL_TABLET | Freq: Four times a day (QID) | ORAL | Status: DC | PRN
  Administered 2016-07-30: 25 mg via ORAL
  Filled 2016-07-28 (×2): qty 1

## 2016-07-28 MED ORDER — METHOCARBAMOL 500 MG PO TABS
500.0000 mg | ORAL_TABLET | Freq: Three times a day (TID) | ORAL | Status: DC | PRN
  Administered 2016-07-29 – 2016-07-30 (×2): 500 mg via ORAL
  Filled 2016-07-28 (×2): qty 1

## 2016-07-28 MED ORDER — MEDROXYPROGESTERONE ACETATE 150 MG/ML IM SUSP
150.0000 mg | Freq: Once | INTRAMUSCULAR | Status: AC
Start: 2016-07-28 — End: 2016-07-28
  Administered 2016-07-28: 150 mg via INTRAMUSCULAR
  Filled 2016-07-28: qty 1

## 2016-07-28 MED ORDER — CLONIDINE HCL 0.1 MG PO TABS: 0.1000 mg | ORAL_TABLET | ORAL | Status: DC

## 2016-07-28 MED ORDER — CLONIDINE HCL 0.1 MG PO TABS
0.1000 mg | ORAL_TABLET | Freq: Four times a day (QID) | ORAL | Status: DC
  Administered 2016-07-28: 0.1 mg via ORAL
  Filled 2016-07-28: qty 1

## 2016-07-28 MED ORDER — ACETAMINOPHEN 325 MG PO TABS
650.0000 mg | ORAL_TABLET | ORAL | Status: DC | PRN
  Administered 2016-07-30 – 2016-07-31 (×3): 650 mg via ORAL
  Filled 2016-07-28 (×4): qty 2

## 2016-07-28 MED ORDER — LORAZEPAM 1 MG PO TABS
1.0000 mg | ORAL_TABLET | Freq: Three times a day (TID) | ORAL | Status: DC | PRN
  Administered 2016-07-28 – 2016-07-29 (×3): 1 mg via ORAL
  Filled 2016-07-28 (×3): qty 1

## 2016-07-28 NOTE — ED Provider Notes (Signed)
[name] DEPT Provider Note   CSN: [id number] Arrival date & time: 07/28/16  1805     History   Chief Complaint Chief Complaint  Patient presents with  . Suicidal  . Addiction Problem    HPI Kelly [name] is a 31 y.o. female.  HPI Patient presents with polysubstance abuse and depression/suicidal thoughts. [facility] today after a vaginal delivery. Had been discharged home today but thinks she needs treatment for her withdrawal and depression. She injects heroin. She injects around 3 g a day. Also uses cocaine. Denies other drug use. States she last used right before she went into the hospital. The baby is reportedly in the NICU detoxing. Patient states that she no longer wants to live. Suicidal but does not have a clear plan.   Past Medical History:  Diagnosis Date  . Abscess of breast    rt breast  . ADD (attention deficit disorder)   . Back pain   . Bipolar 1 disorder ([place])   . Hypertension   . Polysubstance abuse   . Suicidal overdose [facility])     Patient Active Problem List   Diagnosis Date Noted  . Normal labor 07/26/2016  . Foreign body granuloma of soft tissue, NEC, right upper arm   . Pyelonephritis 05/28/2016  . Polysubstance abuse 05/25/2016  . No prenatal care in current pregnancy 05/25/2016  . Chronic hypertension affecting pregnancy 05/25/2016  . History of cesarean delivery affecting pregnancy 05/25/2016  . Community acquired pneumonia 05/25/2016  . Pyelonephritis affecting pregnancy 05/24/2016  . Palliative care encounter   . Abscess of right breast 07/13/2014  . Opioid dependence ([place]) 08/25/2013  . Unspecified episodic mood disorder 08/25/2013  . ADHD (attention deficit hyperactivity disorder) 08/25/2013  . Cocaine abuse 08/25/2013  . Thoracic back pain 01/29/2013  . CROHN'S DISEASE 05/03/2005  . COLONIC POLYPS 02/28/2003    Past Surgical History:  Procedure Laterality Date  . BACK SURGERY      scoliosis-in care everywhere 08/1999  . CESAREAN SECTION    . CHOLECYSTECTOMY  March 2015  . FOREIGN BODY REMOVAL Right 05/28/2016   Procedure: REMOVAL FOREIGN BODY RIGHT ARM;  Surgeon: Meredith Pel, MD;  Location: [place];  Service: Orthopedics;  Laterality: Right;  . IRRIGATION AND DEBRIDEMENT ABSCESS Right 07/13/2014   Procedure: IRRIGATION AND DEBRIDEMEN TRIGHT BREAST ABSCESS;  Surgeon: Donnie Mesa, MD;  Location: [place];  Service: General;  Laterality: Right;    OB History    Gravida Para Term Preterm AB Living   5 1 1   3 1    SAB TAB Ectopic Multiple Live Births   3       1       Home Medications    Prior to Admission medications   Medication Sig Start Date End Date Taking? Authorizing Provider  ibuprofen (ADVIL,MOTRIN) 800 MG tablet Take 800 mg by mouth every 8 (eight) hours as needed.   Yes Historical Provider, MD  methadone (DOLOPHINE) 10 MG tablet Take 80 mg by mouth every 8 (eight) hours.   Yes Historical Provider, MD  ibuprofen (ADVIL,MOTRIN) 600 MG tablet Take 1 tablet (600 mg total) by mouth every 6 (six) hours as needed. Patient not taking: Reported on 07/28/2016 07/08/15   Milly Jakob, MD  oxyCODONE-acetaminophen (PERCOCET) 10-325 MG tablet Take 1 tablet by mouth every 4 (four) hours as needed (severe pain). Patient not taking: Reported on 07/28/2016 07/08/15   Milly Jakob, MD    Family  History Family History  Problem Relation Age of Onset  . Diabetes Mother   . Diabetes Father   . Hypertension Father   . Heart attack Neg Hx   . Hyperlipidemia Neg Hx   . Sudden death Neg Hx     Social History Social History  Substance Use Topics  . Smoking status: Current Every Day Smoker    Packs/day: 1.00    Years: 13.00    Types: Cigarettes  . Smokeless tobacco: Never Used  . Alcohol use Yes     Comment: one or two drinks weekly     Allergies   Hydrocodone and Morphine and related   Review of Systems Review of Systems  Constitutional: Negative for  appetite change.  HENT: Negative for congestion.   Cardiovascular: Negative for chest pain.  Gastrointestinal: Positive for vomiting. Negative for abdominal pain.  Genitourinary: Positive for vaginal bleeding.  Musculoskeletal: Positive for myalgias. Negative for back pain.  Skin: Negative for wound.  Neurological: Negative for tremors and numbness.     Physical Exam Updated Vital Signs BP (!) 150/85 (BP Location: Right Arm)   Pulse (!) 42   Temp 98.6 F (37 C) (Oral)   Resp 18   LMP  (LMP Unknown) Comment: pt refused to answer what her due date is  SpO2 93%   Physical Exam  Constitutional: She appears well-developed.  HENT:  Head: Atraumatic.  Eyes: Pupils are equal, round, and reactive to light.  Neck: Neck supple.  Cardiovascular: Normal rate.   Pulmonary/Chest: Effort normal.  Abdominal: Soft. There is no tenderness.  Musculoskeletal: She exhibits no edema.  Neurological: She is alert.  Skin: Skin is warm. Capillary refill takes less than 2 seconds.  Physical injection marks on her forearms.  Psychiatric:  Patient is tearful.     ED Treatments / Results  Labs (all labs ordered are listed, but only abnormal results are displayed) Labs Reviewed  COMPREHENSIVE METABOLIC PANEL - Abnormal; Notable for the following:       Result Value   CO2 19 (*)    Calcium 8.6 (*)    Albumin 2.6 (*)    ALT 7 (*)    All other components within normal limits  ACETAMINOPHEN LEVEL - Abnormal; Notable for the following:    Acetaminophen (Tylenol), Serum <10 (*)    All other components within normal limits  CBC - Abnormal; Notable for the following:    RBC 2.83 (*)    Hemoglobin 8.6 (*)    HCT 25.3 (*)    All other components within normal limits  RAPID URINE DRUG SCREEN, HOSP PERFORMED - Abnormal; Notable for the following:    Opiates POSITIVE (*)    Cocaine POSITIVE (*)    All other components within normal limits  ETHANOL  SALICYLATE LEVEL    EKG  EKG  Interpretation None       Radiology No results found.  Procedures Procedures (including critical care time)  Medications Ordered in ED Medications - No data to display   Initial Impression / Assessment and Plan / ED Course  I have reviewed the triage vital signs and the nursing notes.  Pertinent labs & imaging results that were available during my care of the patient were reviewed by me and considered in my medical decision making (see chart for details).     Patient presents with polysubstance abuse. Positive for opiates and cocaine now but also had a positive benzos and amphetamines 3 days ago. States she does not want  to live. 2 days postpartum. Patient's child is in the NICU withdrawing. Has an anemia of 8 which is not unusual postpartum. Lab work otherwise reassuring. Has a bradycardia this been stable here. Medically cleared. To be seen by TTS.  TTS has recommended inpatient treatment.  Final Clinical Impressions(s) / ED Diagnoses   Final diagnoses:  Polysubstance abuse  Suicidal ideation    New Prescriptions New Prescriptions   No medications on file     Davonna Belling, MD 07/28/16 2239

## 2016-07-28 NOTE — ED Notes (Signed)
Hourly rounding reveals patient in room. No complaints, stable, in no acute distress. Q15 minute rounds and monitoring via Security Cameras to continue. 

## 2016-07-28 NOTE — ED Notes (Signed)
Hourly rounding reveals patient sleeping in room. No complaints, stable, in no acute distress. Q15 minute rounds and monitoring via Security Cameras to continue. 

## 2016-07-28 NOTE — ED Notes (Signed)
Pt. c/o anxiety and nausea.

## 2016-07-28 NOTE — BH Assessment (Signed)
Tele Assessment Note   Kelly [name] is an 31 y.o. female presenting to [facility] reporting suicidal ideation with a plan to overdose. Pt stated "I am detoxing and I just had a baby". "I don't feel like living anymore". Pt reported that she gave birth on the 15th and her baby is currently in the NICU. Pt reported that she has attempted suicide by overdose in the past. No current mental health or substance abuse treatment reported. Pt shared she has received substance abuse treatment in the past. Pt reported that she is dealing with multiple stressors such as giving birth and "unable to have my child", living with friends in a hotel and drug addiction. Pt is reporting multiple depressive symptoms and shared that her sleep and appetite has been poor. Pt denies HI and AVH at this time. No pending criminal charges or upcoming court dates reported. Pt reported that she injects 2-3 grams of heroin daily and smokes approximately 1 gram of crack daily. No physical, sexual or emotional abuse reported.    Diagnosis: Bipolar I disorder, current episode depressed; Opioid Use Disorder, Severe; Cocaine Use Disorder, Severe   Past Medical History:  Past Medical History:  Diagnosis Date  . Abscess of breast    rt breast  . ADD (attention deficit disorder)   . Back pain   . Bipolar 1 disorder ([place])   . Hypertension   . Polysubstance abuse   . Suicidal overdose [facility])     Past Surgical History:  Procedure Laterality Date  . BACK SURGERY     scoliosis-in care everywhere 08/1999  . CESAREAN SECTION    . CHOLECYSTECTOMY  March 2015  . FOREIGN BODY REMOVAL Right 05/28/2016   Procedure: REMOVAL FOREIGN BODY RIGHT ARM;  Surgeon: Meredith Pel, MD;  Location: [place];  Service: Orthopedics;  Laterality: Right;  . IRRIGATION AND DEBRIDEMENT ABSCESS Right 07/13/2014   Procedure: IRRIGATION AND DEBRIDEMEN TRIGHT BREAST ABSCESS;  Surgeon: Donnie Mesa, MD;  Location: [place];  Service: General;  Laterality: Right;     Family History:  Family History  Problem Relation Age of Onset  . Diabetes Mother   . Diabetes Father   . Hypertension Father   . Heart attack Neg Hx   . Hyperlipidemia Neg Hx   . Sudden death Neg Hx     Social History:  reports that she has been smoking Cigarettes.  She has a 13.00 pack-year smoking history. She has never used smokeless tobacco. She reports that she drinks alcohol. She reports that she uses drugs, including Cocaine, Heroin, and IV.  Additional Social History:  Alcohol / Drug Use History of alcohol / drug use?: Yes Longest period of sobriety (when/how long): 1.5 2008 and 2009 Negative Consequences of Use: Financial, Scientist, research (physical sciences), Personal relationships, Work / School Withdrawal Symptoms: Fever / Chills, Irritability, Nausea / Vomiting, Patient aware of relationship between substance abuse and physical/medical complications, Sweats Substance #1 Name of Substance 1: Heroin  1 - Age of First Use: 26 1 - Amount (size/oz): 2-3 grams  1 - Frequency: daily  1 - Duration: 1 year 1 - Last Use / Amount: 07-26-16 Substance #2 Name of Substance 2: Cocaine  2 - Age of First Use: 12 2 - Amount (size/oz): 1 gram  2 - Frequency: daily  2 - Duration: 1 year 2 - Last Use / Amount: 07-28-16  CIWA: CIWA-Ar BP: (!) 150/85 Pulse Rate: (!) 42 COWS:    PATIENT STRENGTHS: (choose at least two) Motivation for treatment/growth Supportive  family/friends  Allergies:  Allergies  Allergen Reactions  . Hydrocodone Itching  . Morphine And Related Nausea And Vomiting    Home Medications:  (Not in a hospital admission)  OB/GYN Status:  No LMP recorded (lmp unknown). Patient is pregnant.  General Assessment Data Location of Assessment: [facility] TTS Assessment: In system Is this a Tele or Face-to-Face Assessment?: Face-to-Face Is this an Initial Assessment or a Re-assessment for this encounter?: Initial Assessment Marital status: Single Is patient pregnant?: No Pregnancy Status:  No Living Arrangements: Non-relatives/Friends Can pt return to current living arrangement?: Yes Admission Status: Voluntary Is patient capable of signing voluntary admission?: Yes Referral Source: Self/Family/Friend Insurance type: Medicaid      Crisis Care Plan Living Arrangements: Non-relatives/Friends Name of Psychiatrist: No provider reported  Name of Therapist: No provider reported   Education Status Is patient currently in school?: No Highest grade of school patient has completed: Some college   Risk to self with the past 6 months Suicidal Ideation: Yes-Currently Present Has patient been a risk to self within the past 6 months prior to admission? : No Suicidal Intent: Yes-Currently Present Has patient had any suicidal intent within the past 6 months prior to admission? : No Is patient at risk for suicide?: Yes Suicidal Plan?: Yes-Currently Present Has patient had any suicidal plan within the past 6 months prior to admission? : No Specify Current Suicidal Plan: Overdose  Access to Means: Yes Specify Access to Suicidal Means: Drugs What has been your use of drugs/alcohol within the last 12 months?: Daily heroin and cocaine use reported.  Previous Attempts/Gestures: Yes How many times?: 1 Other Self Harm Risks: Pt denies Triggers for Past Attempts: Unpredictable Intentional Self Injurious Behavior: None Family Suicide History: No Recent stressful life event(s): Other (Comment) (Addiction, Recently gave birth and unable to have child ) Persecutory voices/beliefs?: No Depression: Yes Depression Symptoms: Despondent, Insomnia, Tearfulness, Isolating, Fatigue, Guilt, Loss of interest in usual pleasures, Feeling worthless/self pity, Feeling angry/irritable Substance abuse history and/or treatment for substance abuse?: Yes Suicide prevention information given to non-admitted patients: Not applicable  Risk to Others within the past 6 months Homicidal Ideation: No Does  patient have any lifetime risk of violence toward others beyond the six months prior to admission? : No Thoughts of Harm to Others: No Current Homicidal Intent: No Current Homicidal Plan: No Access to Homicidal Means: No Identified Victim: N/A History of harm to others?: No Assessment of Violence: None Noted Violent Behavior Description: No violent behaviors observed. Pt is calm and cooperative at this time.  Does patient have access to weapons?: No Criminal Charges Pending?: No Does patient have a court date: No Is patient on probation?: No  Psychosis Hallucinations: None noted Delusions: None noted  Mental Status Report Appearance/Hygiene: In scrubs Eye Contact: Fair Motor Activity: Unremarkable Speech: Logical/coherent Level of Consciousness: Quiet/awake Mood: Depressed Affect: Appropriate to circumstance Anxiety Level: Minimal Thought Processes: Coherent, Relevant Judgement: Unimpaired Orientation: Person, Place, Time, Situation Obsessive Compulsive Thoughts/Behaviors: None  Cognitive Functioning Concentration: Fair Memory: Recent Intact, Remote Intact IQ: Average Insight: Fair Impulse Control: Fair Appetite: Fair Weight Loss: 0 Weight Gain: 0 Sleep: Decreased Total Hours of Sleep: 4 Vegetative Symptoms: Staying in bed  ADLScreening [facility] Assessment Services) Patient's cognitive ability adequate to safely complete daily activities?: Yes Patient able to express need for assistance with ADLs?: Yes Independently performs ADLs?: Yes (appropriate for developmental age)  Prior Inpatient Therapy Prior Inpatient Therapy: Yes Prior Therapy Dates: 2015 Prior Therapy Facilty/Provider(s): [facility], [facility]  Reason for Treatment: Substance abuse   Prior Outpatient Therapy Prior Outpatient Therapy: No Does patient have an ACCT team?: No Does patient have Intensive In-House Services?  : No Does patient have [facility] services? : No Does patient have P4CC services?: No  ADL  Screening (condition at time of admission) Patient's cognitive ability adequate to safely complete daily activities?: Yes Is the patient deaf or have difficulty hearing?: No Does the patient have difficulty seeing, even when wearing glasses/contacts?: No Does the patient have difficulty concentrating, remembering, or making decisions?: No Patient able to express need for assistance with ADLs?: Yes Does the patient have difficulty dressing or bathing?: No Independently performs ADLs?: Yes (appropriate for developmental age)       Abuse/Neglect Assessment (Assessment to be complete while patient is alone) Physical Abuse: Denies Verbal Abuse: Denies Sexual Abuse: Denies Exploitation of patient/patient's resources: Denies Self-Neglect: Denies          Additional Information 1:1 In Past 12 Months?: No CIRT Risk: No Elopement Risk: No Does patient have medical clearance?: Yes     Disposition:  Disposition Initial Assessment Completed for this Encounter: Yes Disposition of Patient: Inpatient treatment program Type of inpatient treatment program: Adult  Seanna Sisler S 07/28/2016 8:57 PM

## 2016-07-28 NOTE — BH Assessment (Signed)
Assessment completed. Consulted Lindon Romp, FNP who recommended inpatient treatment. Informed Dr. Alvino Chapel of the recommendation. TTS will seek placement.

## 2016-07-28 NOTE — ED Notes (Signed)
Pt. Transferred to [facility] from ED to room after screening for contraband. Report to include Situation, Background, Assessment and Recommendations from [facility]. Pt. Oriented to unit including Q15 minute rounds as well as the security cameras for their protection. Patient is alert and oriented, warm and dry in no acute distress. Patient denies SI, HI, and AVH. Pt. Encouraged to let me know if needs arise.

## 2016-07-28 NOTE — Discharge Summary (Signed)
OB Discharge Summary  Patient Name: Kelly [name] DOB: 1985/06/26 MRN: [id number]  Date of admission: 07/26/2016 Delivering MD: Serita Grammes D   Date of discharge: 07/28/2016  Admitting diagnosis: 67 WEEKS CTX ROM FUID GREEN  Intrauterine pregnancy: [redacted]w[redacted]d    Secondary diagnosis:Active Problems:   Normal labor  Additional problems: multi drug addition, on methadone     Discharge diagnosis: Term Pregnancy Delivered                                                                     Post partum procedures: social work consul   Complications: None  Hospital course:  Onset of Labor With Vaginal Delivery     31y.o. yo [id number] admitted in Active Labor on 07/26/2016. Patient had an uncomplicated labor course as follows:  Membrane Rupture Time/Date: 1:37 AM ,07/26/2016   Intrapartum Procedures: Episiotomy: None [1]                                         Lacerations:  None [1]  Patient had a delivery of a Viable infant. 07/26/2016  Information for the patient's newborn:  ReClaudie, [place]0[[id number]]Delivery Method: Vaginal, Spontaneous Delivery (Filed from Delivery Summary)    Pateint had an uncomplicated postpartum course.  She is ambulating, tolerating a regular diet, passing flatus, and urinating well. Patient is discharged home in stable condition on 07/28/16.   Physical exam  Vitals:   07/26/16 1755 07/27/16 0628 07/27/16 1149 07/28/16 0511  BP: 122/66 115/62  128/70  Pulse: (!) 50 (!) 53  73  Resp: 20 20  17   Temp: 98 F (36.7 C) 98.1 F (36.7 C) 98.4 F (36.9 C) 98.1 F (36.7 C)  TempSrc: Oral Oral Oral Oral  SpO2:      Weight:      Height:       General: alert Lochia: appropriate Uterine Fundus: firm DVT Evaluation: No evidence of DVT seen on physical exam. Labs: Lab Results  Component Value Date   WBC 17.9 (H) 07/26/2016   HGB 11.1 (L) 07/26/2016   HCT 33.1 (L) 07/26/2016   MCV 90.7 07/26/2016   PLT 337 07/26/2016   CMP  Latest Ref Rng & Units 07/26/2016  Glucose 65 - 99 mg/dL 94  BUN 6 - 20 mg/dL 10  Creatinine 0.44 - 1.00 mg/dL 0.66  Sodium 135 - 145 mmol/L 133(L)  Potassium 3.5 - 5.1 mmol/L 3.3(L)  Chloride 101 - 111 mmol/L 102  CO2 22 - 32 mmol/L 21(L)  Calcium 8.9 - 10.3 mg/dL 8.3(L)  Total Protein 6.5 - 8.1 g/dL 6.9  Total Bilirubin 0.3 - 1.2 mg/dL 0.4  Alkaline Phos 38 - 126 U/L 134(H)  AST 15 - 41 U/L 16  ALT 14 - 54 U/L 6(L)    Discharge instruction: per After Visit Summary and "Baby and Me Booklet".  After Visit Meds:  Depo provera prior to discharge, IBU, methadone, PNV  Diet: routine diet  Activity: Advance as tolerated. Pelvic rest for 6 weeks.   Outpatient follow up:6 weeks Follow up Appt:No future appointments. Follow up visit: No  Follow-up on file.  Postpartum contraception: Depo Provera  Newborn Data: Live born female  Birth Weight: 6 lb 9.1 oz (2980 g) APGAR: 9, 9  Baby Feeding: Bottle Disposition:NICU for fetal abstinence syndrome   07/28/2016 Emily Filbert, MD

## 2016-07-28 NOTE — ED Notes (Signed)
Pt has been seen and wand by security.    Pt's father took valuables. Pt has clothing and blk sandals in locker.

## 2016-07-28 NOTE — ED Notes (Signed)
Bed: [facility] Expected date:  Expected time:  Means of arrival:  Comments: [place]

## 2016-07-28 NOTE — Progress Notes (Signed)
At 1315 I arrived in the patients room to administer discharge meds of DepoProvera and an MMR. Pt. Was very drowsy but arousable. Her environment was piled up with dirty laundry and clothing on the floor surrounding her bed and smelled like urine. I cleared the area around the bed to prevent a fall. I asked when her ride was coming since we would like to complete her discharge process by 1500. She stated "no one has helped me." I asked if the social worker had come by to see her. She said yes and picked up her phone to call someone. When I returned to the room she was up anxiously picking things up saying, " I'm sorry I threw up on the floor. I feel so bad. " We discussed the process of D/C as I gave her a bag to put her belongings in. The phone rang two times and she ignored it. She said she thought she had to name her baby so I said I would check into it. She said her ride was here and that she would take her belongings to the car and come back to sign her instructions and receive her MMR. As of 1527 she still hasn't returned.

## 2016-07-28 NOTE — Discharge Instructions (Signed)
Home Care Instructions for Mom  ACTIVITY  Gradually return to your regular activities.  Let yourself rest. Nap while your baby sleeps.  Avoid lifting anything that is heavier than 10 lb (4.5 kg) until your health care provider says it is okay.  Avoid activities that take a lot of effort and energy (are strenuous) until approved by your health care provider. Walking at a slow-to-moderate pace is usually safe.  If you had a cesarean delivery:  Do not vacuum, climb stairs, or drive a car for 4-6 weeks.  Have someone help you at home until you feel like you can do your usual activities yourself.  Do exercises as told by your health care provider, if this applies. VAGINAL BLEEDING You may continue to bleed for 4-6 weeks after delivery. Over time, the amount of blood usually decreases and the color of the blood usually gets lighter. However, the flow of bright red blood may increase if you have been too active. If you need to use more than one pad in an hour because your pad gets soaked, or if you pass a large clot:  Lie down.  Raise your feet.  Place a cold compress on your lower abdomen.  Rest.  Call your health care provider. If you are breastfeeding, your period should return anytime between 8 weeks after delivery and the time that you stop breastfeeding. If you are not breastfeeding, your period should return 6-8 weeks after delivery. PERINEAL CARE The perineal area, or perineum, is the part of your body between your thighs. After delivery, this area needs special care. Follow these instructions as told by your health care provider.  Take warm tub baths for 15-20 minutes.  Use medicated pads and pain-relieving sprays and creams as told.  Do not use tampons or douches until vaginal bleeding has stopped.  Each time you go to the bathroom:  Use a peri bottle.  Change your pad.  Use towelettes in place of toilet paper until your stitches have healed.  Do Kegel exercises  every day. Kegel exercises help to maintain the muscles that support the vagina, bladder, and bowels. You can do these exercises while you are standing, sitting, or lying down. To do Kegel exercises:  Tighten the muscles of your abdomen and the muscles that surround your birth canal.  Hold for a few seconds.  Relax.  Repeat until you have done this 5 times in a row.  To prevent hemorrhoids from developing or getting worse:  Drink enough fluid to keep your urine clear or pale yellow.  Avoid straining when having a bowel movement.  Take over-the-counter medicines and stool softeners as told by your health care provider. BREAST CARE  Wear a tight-fitting bra.  Avoid taking over-the-counter pain medicine for breast discomfort.  Apply ice to the breasts to help with discomfort as needed:  Put ice in a plastic bag.  Place a towel between your skin and the bag.  Leave the ice on for 20 minutes or as told by your health care provider. NUTRITION  Eat a well-balanced diet.  Do not try to lose weight quickly by cutting back on calories.  Take your prenatal vitamins until your postpartum checkup or until your health care provider tells you to stop. POSTPARTUM DEPRESSION You may find yourself crying for no apparent reason and unable to cope with all of the changes that come with having a newborn. This mood is called postpartum depression. Postpartum depression happens because your hormone levels change after delivery.  If you have postpartum depression, get support from your partner, friends, and family. If the depression does not go away on its own after several weeks, contact your health care provider. BREAST SELF-EXAM Do a breast self-exam each month, at the same time of the month. If you are breastfeeding, check your breasts just after a feeding, when your breasts are less full. If you are breastfeeding and your period has started, check your breasts on day 5, 6, or 7 of your  period. Report any lumps, bumps, or discharge to your health care provider. Know that breasts are normally lumpy if you are breastfeeding. This is temporary, and it is not a health risk. INTIMACY AND SEXUALITY Avoid sexual activity for at least 3-4 weeks after delivery or until the brownish-red vaginal flow is completely gone. If you want to avoid pregnancy, use some form of birth control. You can get pregnant after delivery, even if you have not had your period. SEEK MEDICAL CARE IF:  You feel unable to cope with the changes that a child brings to your life, and these feelings do not go away after several weeks.  You notice a lump, a bump, or discharge on your breast. SEEK IMMEDIATE MEDICAL CARE IF:  Blood soaks your pad in 1 hour or less.  You have:  Severe pain or cramping in your lower abdomen.  A bad-smelling vaginal discharge.  A fever that is not controlled by medicine.  A fever, and an area of your breast is red and sore.  Pain or redness in your calf.  Sudden, severe chest pain.  Shortness of breath.  Painful or bloody urination.  Problems with your vision.  You vomit for 12 hours or longer.  You develop a severe headache.  You have serious thoughts about hurting yourself, your child, or anyone else. This information is not intended to replace advice given to you by your health care provider. Make sure you discuss any questions you have with your health care provider. Document Released: 04/27/2000 Document Revised: 10/06/2015 Document Reviewed: 11/01/2014 Elsevier Interactive Patient Education  2017 [employer]. Contraception Choices Contraception, also called birth control, means things to use or ways to try not to get pregnant. Hormonal birth control  This kind of birth control uses hormones. Here are some types of hormonal birth control:  A tube that is put under skin of the arm (implant). The tube can stay in for as long as 3 years.  Shots to get every 3  months (injections).  Pills to take every day (birth control pills).  A patch to change 1 time each week for 3 weeks (birth control patch). After that, the patch is taken off for 1 week.  A ring to put in the vagina. The ring is left in for 3 weeks. Then it is taken out of the vagina for 1 week. Then a new ring is put in.  Pills to take after unprotected sex (emergency birth control pills). Barrier birth control  Here are some types of barrier birth control:  A thin covering that is put on the penis before sex (female condom). The covering is thrown away after sex.  A soft, loose covering that is put in the vagina before sex (female condom). The covering is thrown away after sex.  A rubber bowl that sits over the cervix (diaphragm). The bowl must be made for you. The bowl is put into the vagina before sex. The bowl is left in for 6-8 hours after sex. It  is taken out within 24 hours.  A small, soft cup that fits over the cervix (cervical cap). The cup must be made for you. The cup can be left in for 6-8 hours after sex. It is taken out within 48 hours.  A sponge that is put into the vagina before sex. It must be left in for at least 6 hours after sex. It must be taken out within 30 hours. Then it is thrown away.  A chemical that kills or stops sperm from getting into the uterus (spermicide). It may be a pill, cream, jelly, or foam to put in the vagina. The chemical should be used at least 10-15 minutes before sex. IUD (intrauterine) birth control An IUD is a small, T-shaped piece of plastic. It is put inside the uterus. There are two kinds:  Hormone IUD. This kind can stay in for 3-5 years.  Copper IUD. This kind can stay in for 10 years. Permanent birth control Here are some types of permanent birth control:  Surgery to block the fallopian tubes.  Having an insert put into each fallopian tube.  Surgery to tie off the tubes that carry sperm (vasectomy). Natural planning birth  control Here are some types of natural planning birth control:  Not having sex on the days the woman could get pregnant.  Using a calendar:  To keep track of the length of each period.  To find out what days pregnancy can happen.  To plan to not have sex on days when pregnancy can happen.  Watching for symptoms of ovulation and not having sex during ovulation. One way the woman can check for ovulation is to check her temperature.  Waiting to have sex until after ovulation. Summary  Contraception, also called birth control, means things to use or ways to try not to get pregnant.  Hormonal methods of birth control include implants, injections, pills, patches, vaginal rings, and emergency birth control pills.  Barrier methods of birth control can include female condoms, female condoms, diaphragms, cervical caps, sponges, and spermicides.  There are two types of IUD (intrauterine device) birth control. An IUD can be put in a woman's uterus to prevent pregnancy for 3-5 years.  Permanent sterilization can be done through a procedure for males, females, or both.  Natural planning methods involve not having sex on the days when the woman could get pregnant. This information is not intended to replace advice given to you by your health care provider. Make sure you discuss any questions you have with your health care provider. Document Released: 02/25/2009 Document Revised: 05/10/2016 Document Reviewed: 05/10/2016 Elsevier Interactive Patient Education  2017 [employer].

## 2016-07-28 NOTE — ED Triage Notes (Signed)
Patient here from [facility] with complaints of suicidal ideation. Reports having a baby a few days ago the baby is in NICU for withdrawal. States that she needs detox from "multiple things". Last used on 3/15.

## 2016-07-28 NOTE — Progress Notes (Signed)
CSW received a phone call from Betty, RN on [facility]. Betty noted patient is upset after finding out she is d/c today. Betty notes patient stated she has no where to go.  This writer met with [name] at bedside. Upon this writers arrival, [name] was asleep and not easily awoken. After continuous effort to awaken [name], this writer was successful. This writer introduced herself and role. [name] was noted to be dozing as this writer was talking. This writer re-iterated the importance of [name] being awake and alert while she is here communicating information regarding d/c. [name] noted she is listening. This writer inquired about [name]'s feelings regarding d/c. [name] noted she was unaware she was d/c today but feels she needs inpatient rehab. This writer recalled meeting with [name] on womens unit at which time she provided [name] with substance use resources and information regarding obtaining her state ID. [name] noted she remembered but has been unsuccessful following up. [name] notes she has went to [facility] and [facility]. [name] feels neither has helped her. This writer asked [name] which treatment facility she is most interested in. [name] notes she would like to be sent somewhere out of town so she can get the help she needed. This writer provided [name] with the phone number to [place] alcohol/drug addiction hotline 1-800-688-4232. This writer informed [name] to call this 24/7 phone number as soon as she d/c and they will be able to point her in the right direction regarding out of town rehab options. [name] verbalized understanding. This writer inquired if she is opposed to staying within the area. [name] notes she does not want to be anywhere near this area. CSW respected [name]'s wishes and did not provide her with resources in the surrounding area. CSW inquired about transportation means. [name] notes she is going to call a friend who will provide her a ride home from the hospital. At this time, no other needs addressed or requested. Case closed to this  CSW.    , MSW, LCSW-A Clinical Social Worker  [facility]  Office: [phone]   

## 2016-07-29 DIAGNOSIS — F1490 Cocaine use, unspecified, uncomplicated: Secondary | ICD-10-CM | POA: Diagnosis not present

## 2016-07-29 DIAGNOSIS — F339 Major depressive disorder, recurrent, unspecified: Secondary | ICD-10-CM

## 2016-07-29 DIAGNOSIS — F1110 Opioid abuse, uncomplicated: Secondary | ICD-10-CM | POA: Diagnosis not present

## 2016-07-29 DIAGNOSIS — Z79891 Long term (current) use of opiate analgesic: Secondary | ICD-10-CM

## 2016-07-29 DIAGNOSIS — F17210 Nicotine dependence, cigarettes, uncomplicated: Secondary | ICD-10-CM | POA: Diagnosis not present

## 2016-07-29 DIAGNOSIS — Z79899 Other long term (current) drug therapy: Secondary | ICD-10-CM

## 2016-07-29 DIAGNOSIS — R45851 Suicidal ideations: Secondary | ICD-10-CM

## 2016-07-29 LAB — URINALYSIS, ROUTINE W REFLEX MICROSCOPIC
Bacteria, UA: NONE SEEN
Bilirubin Urine: NEGATIVE
Glucose, UA: NEGATIVE mg/dL
Ketones, ur: NEGATIVE mg/dL
Leukocytes, UA: NEGATIVE
Nitrite: NEGATIVE
PROTEIN: NEGATIVE mg/dL
SPECIFIC GRAVITY, URINE: 1.004 — AB (ref 1.005–1.030)
pH: 6 (ref 5.0–8.0)

## 2016-07-29 MED ORDER — GABAPENTIN 100 MG PO CAPS
200.0000 mg | ORAL_CAPSULE | Freq: Three times a day (TID) | ORAL | Status: DC
  Administered 2016-07-29 – 2016-07-30 (×6): 200 mg via ORAL
  Filled 2016-07-29 (×4): qty 2

## 2016-07-29 MED ORDER — CLONIDINE HCL 0.1 MG PO TABS
0.1000 mg | ORAL_TABLET | Freq: Three times a day (TID) | ORAL | Status: DC
  Administered 2016-07-29: 0.1 mg via ORAL
  Filled 2016-07-29: qty 1

## 2016-07-29 MED ORDER — CLONIDINE HCL 0.1 MG PO TABS: 0.1000 mg | ORAL_TABLET | Freq: Every day | ORAL | Status: DC

## 2016-07-29 MED ORDER — IBUPROFEN 200 MG PO TABS
600.0000 mg | ORAL_TABLET | Freq: Four times a day (QID) | ORAL | Status: DC | PRN
  Administered 2016-07-29 – 2016-07-30 (×3): 600 mg via ORAL
  Filled 2016-07-29 (×3): qty 3

## 2016-07-29 MED ORDER — CLONIDINE HCL 0.1 MG PO TABS: 0.1000 mg | ORAL_TABLET | ORAL | Status: DC

## 2016-07-29 MED ORDER — BUPROPION HCL ER (XL) 150 MG PO TB24
150.0000 mg | ORAL_TABLET | Freq: Every day | ORAL | Status: DC
  Administered 2016-07-29 – 2016-07-30 (×2): 150 mg via ORAL
  Filled 2016-07-29 (×2): qty 1

## 2016-07-29 MED ORDER — CLONIDINE HCL 0.1 MG PO TABS
0.2000 mg | ORAL_TABLET | Freq: Four times a day (QID) | ORAL | Status: DC
  Administered 2016-07-29 (×2): 0.2 mg via ORAL
  Filled 2016-07-29 (×2): qty 2

## 2016-07-29 NOTE — ED Notes (Signed)
Hourly rounding reveals patient sleeping in room. No complaints, stable, in no acute distress. Q15 minute rounds and monitoring via Security Cameras to continue. 

## 2016-07-29 NOTE — ED Notes (Signed)
Hourly rounding reveals patient in room. No complaints, stable, in no acute distress. Q15 minute rounds and monitoring via Security Cameras to continue. 

## 2016-07-29 NOTE — BH Assessment (Signed)
Patient has been accepted to [facility]. Informed patients nurse and NP of acceptance. Patient will sign voluntary consent to treat and ROI. Patient will be transported to [facility] via [facility] due to voluntary status. Call nurse report to [phone].  Rosalin Hawking, LCSW Therapeutic Triage Specialist [place] 07/29/2016 2:29 PM

## 2016-07-29 NOTE — ED Provider Notes (Signed)
Evaluated at bedside. In short, recent postpartum presenting with opiate withdrawal and SI/depression. Notified by nurse she has been hypertensive 150-160/90s SBP, complaining of diffuse pain. Initially on clonidine 0.1 QID with taper, which was switched to 0.2 QID but no significant improvement.  UA checked. There is no protein. No concerns for preclampsia. Blood work reviewed and she has normal liver function testing, normal plts, normal renal function. Stable anemia. Complains of pain all over. I think her symptoms are more from opiate withdrawal. She does have some bradycardia with this medications; although perfusing normally, will cut back to 0.1 TID with taper.   Forde Dandy, MD 07/29/16 [phone]

## 2016-07-29 NOTE — ED Notes (Signed)
This Probation officer spoke with [facility] about patient's vital signs and patient's condition.  Writer also contacted EDP Dr. Oleta Mouse.  Patient is being further observed prior to transfer to [facility].

## 2016-07-29 NOTE — Consult Note (Signed)
[facility] Face-to-Face Psychiatry Consult   Reason for Consult: psychiatric evaluation Referring Physician: EDP Patient Identification: Kelly [name] MRN:  [id number] Principal Diagnosis: Major depressive disorder, recurrent episode with anxious distress ([place]) Diagnosis:   Patient Active Problem List   Diagnosis Date Noted  . Major depressive disorder, recurrent episode with anxious distress ([place]) [F33.9] 07/29/2016    Priority: High  . Opioid use disorder, severe, dependence ([place]) [F11.20] 08/25/2013    Priority: High  . Normal labor [O80, Z37.9] 07/26/2016  . Foreign body granuloma of soft tissue, NEC, right upper arm [M60.221]   . Pyelonephritis [N12] 05/28/2016  . Polysubstance abuse [F19.10] 05/25/2016  . No prenatal care in current pregnancy [O09.30] 05/25/2016  . Chronic hypertension affecting pregnancy [O10.919] 05/25/2016  . History of cesarean delivery affecting pregnancy [O34.219] 05/25/2016  . Community acquired pneumonia [J18.9] 05/25/2016  . Pyelonephritis affecting pregnancy [O23.00] 05/24/2016  . Palliative care encounter [Z51.5]   . Abscess of right breast [N61.1] 07/13/2014  . Unspecified episodic mood disorder [F39] 08/25/2013  . ADHD (attention deficit hyperactivity disorder) [F90.9] 08/25/2013  . Cocaine abuse [F14.10] 08/25/2013  . Thoracic back pain [M54.6] 01/29/2013  . CROHN'S DISEASE [K50.90] 05/03/2005  . COLONIC POLYPS [D12.6] 02/28/2003    Total Time spent with patient: 45 minutes  Subjective:   Kelly [name] is a 31 y.o. female patient admitted with suicidal ideation and opioid abuse.  HPI:  Patient who reports history of depression and polysubstance dependence.  Patient reports that she delivered a baby on 07/26/16 and did 2-3 grams of heroine and 1 gram of cocaine yesterday and now seeking help from drugs. Also, patient reports increasing depressive symptoms, hopelessness and suicidal thoughts. She worry about her new born baby who is currently  in NICU detoxing. Patient states that she no longer wants to live and unable to contract for safety.  Past Psychiatric History: as above  Risk to Self: Suicidal Ideation: Yes-Currently Present Suicidal Intent: Yes-Currently Present Is patient at risk for suicide?: Yes Suicidal Plan?: Yes-Currently Present Specify Current Suicidal Plan: Overdose  Access to Means: Yes Specify Access to Suicidal Means: Drugs What has been your use of drugs/alcohol within the last 12 months?: Daily heroin and cocaine use reported.  How many times?: 1 Other Self Harm Risks: Pt denies Triggers for Past Attempts: Unpredictable Intentional Self Injurious Behavior: None Risk to Others: Homicidal Ideation: No Thoughts of Harm to Others: No Current Homicidal Intent: No Current Homicidal Plan: No Access to Homicidal Means: No Identified Victim: N/A History of harm to others?: No Assessment of Violence: None Noted Violent Behavior Description: No violent behaviors observed. Pt is calm and cooperative at this time.  Does patient have access to weapons?: No Criminal Charges Pending?: No Does patient have a court date: No Prior Inpatient Therapy: Prior Inpatient Therapy: Yes Prior Therapy Dates: 2015 Prior Therapy Facilty/Provider(s): [facility]  Reason for Treatment: Substance abuse  Prior Outpatient Therapy: Prior Outpatient Therapy: No Does patient have an ACCT team?: No Does patient have Intensive In-House Services?  : No Does patient have [facility] services? : No Does patient have P4CC services?: No  Past Medical History:  Past Medical History:  Diagnosis Date  . Abscess of breast    rt breast  . ADD (attention deficit disorder)   . Back pain   . Bipolar 1 disorder ([place])   . Hypertension   . Polysubstance abuse   . Suicidal overdose [name])     Past Surgical History:  Procedure Laterality Date  .  BACK SURGERY     scoliosis-in care everywhere 08/1999  . CESAREAN SECTION    . CHOLECYSTECTOMY   March 2015  . FOREIGN BODY REMOVAL Right 05/28/2016   Procedure: REMOVAL FOREIGN BODY RIGHT ARM;  Surgeon: Meredith Pel, MD;  Location: [place];  Service: Orthopedics;  Laterality: Right;  . IRRIGATION AND DEBRIDEMENT ABSCESS Right 07/13/2014   Procedure: IRRIGATION AND DEBRIDEMEN TRIGHT BREAST ABSCESS;  Surgeon: Donnie Mesa, MD;  Location: [place];  Service: General;  Laterality: Right;   Family History:  Family History  Problem Relation Age of Onset  . Diabetes Mother   . Diabetes Father   . Hypertension Father   . Heart attack Neg Hx   . Hyperlipidemia Neg Hx   . Sudden death Neg Hx    Family Psychiatric  History: Social History:  History  Alcohol Use  . Yes    Comment: one or two drinks weekly     History  Drug Use  . Types: Cocaine, Heroin, IV    Comment: heroin, crack cocaine    Social History   Social History  . Marital status: Single    Spouse name: N/A  . Number of children: N/A  . Years of education: N/A   Social History Main Topics  . Smoking status: Current Every Day Smoker    Packs/day: 1.00    Years: 13.00    Types: Cigarettes  . Smokeless tobacco: Never Used  . Alcohol use Yes     Comment: one or two drinks weekly  . Drug use: Yes    Types: Cocaine, Heroin, IV     Comment: heroin, crack cocaine  . Sexual activity: Yes    Birth control/ protection: Condom   Other Topics Concern  . None   Social History Narrative  . None   Additional Social History:    Allergies:   Allergies  Allergen Reactions  . Hydrocodone Itching  . Morphine And Related Nausea And Vomiting    Labs:  Results for orders placed or performed during the hospital encounter of 07/28/16 (from the past 48 hour(s))  Rapid urine drug screen (hospital performed)     Status: Abnormal   Collection Time: 07/28/16  6:41 PM  Result Value Ref Range   Opiates POSITIVE (A) NONE DETECTED   Cocaine POSITIVE (A) NONE DETECTED   Benzodiazepines NONE DETECTED NONE DETECTED    Amphetamines NONE DETECTED NONE DETECTED   Tetrahydrocannabinol NONE DETECTED NONE DETECTED   Barbiturates NONE DETECTED NONE DETECTED    Comment:        DRUG SCREEN FOR MEDICAL PURPOSES ONLY.  IF CONFIRMATION IS NEEDED FOR ANY PURPOSE, NOTIFY LAB WITHIN 5 DAYS.        LOWEST DETECTABLE LIMITS FOR URINE DRUG SCREEN Drug Class       Cutoff (ng/mL) Amphetamine      1000 Barbiturate      200 Benzodiazepine   194 Tricyclics       174 Opiates          300 Cocaine          300 THC              50   Comprehensive metabolic panel     Status: Abnormal   Collection Time: 07/28/16  6:45 PM  Result Value Ref Range   Sodium 138 135 - 145 mmol/L   Potassium 3.9 3.5 - 5.1 mmol/L   Chloride 108 101 - 111 mmol/L   CO2 19 (L) 22 -  32 mmol/L   Glucose, Bld 75 65 - 99 mg/dL   BUN 16 6 - 20 mg/dL   Creatinine, Ser 0.76 0.44 - 1.00 mg/dL   Calcium 8.6 (L) 8.9 - 10.3 mg/dL   Total Protein 6.7 6.5 - 8.1 g/dL   Albumin 2.6 (L) 3.5 - 5.0 g/dL   AST 17 15 - 41 U/L   ALT 7 (L) 14 - 54 U/L   Alkaline Phosphatase 110 38 - 126 U/L   Total Bilirubin 0.3 0.3 - 1.2 mg/dL   GFR calc non Af Amer >60 >60 mL/min   GFR calc Af Amer >60 >60 mL/min    Comment: (NOTE) The eGFR has been calculated using the CKD EPI equation. This calculation has not been validated in all clinical situations. eGFR's persistently <60 mL/min signify possible Chronic Kidney Disease.    Anion gap 11 5 - 15  Ethanol     Status: None   Collection Time: 07/28/16  6:45 PM  Result Value Ref Range   Alcohol, Ethyl (B) <5 <5 mg/dL    Comment:        LOWEST DETECTABLE LIMIT FOR SERUM ALCOHOL IS 5 mg/dL FOR MEDICAL PURPOSES ONLY   Salicylate level     Status: None   Collection Time: 07/28/16  6:45 PM  Result Value Ref Range   Salicylate Lvl <6.2 2.8 - 30.0 mg/dL  Acetaminophen level     Status: Abnormal   Collection Time: 07/28/16  6:45 PM  Result Value Ref Range   Acetaminophen (Tylenol), Serum <10 (L) 10 - 30 ug/mL     Comment:        THERAPEUTIC CONCENTRATIONS VARY SIGNIFICANTLY. A RANGE OF 10-30 ug/mL MAY BE AN EFFECTIVE CONCENTRATION FOR MANY PATIENTS. HOWEVER, SOME ARE BEST TREATED AT CONCENTRATIONS OUTSIDE THIS RANGE. ACETAMINOPHEN CONCENTRATIONS >150 ug/mL AT 4 HOURS AFTER INGESTION AND >50 ug/mL AT 12 HOURS AFTER INGESTION ARE OFTEN ASSOCIATED WITH TOXIC REACTIONS.   cbc     Status: Abnormal   Collection Time: 07/28/16  6:45 PM  Result Value Ref Range   WBC 10.4 4.0 - 10.5 K/uL   RBC 2.83 (L) 3.87 - 5.11 MIL/uL   Hemoglobin 8.6 (L) 12.0 - 15.0 g/dL   HCT 25.3 (L) 36.0 - 46.0 %   MCV 89.4 78.0 - 100.0 fL   MCH 30.4 26.0 - 34.0 pg   MCHC 34.0 30.0 - 36.0 g/dL   RDW 14.2 11.5 - 15.5 %   Platelets 243 150 - 400 K/uL    Current Facility-Administered Medications  Medication Dose Route Frequency Provider Last Rate Last Dose  . acetaminophen (TYLENOL) tablet 650 mg  650 mg Oral Q4H PRN Davonna Belling, MD      . buPROPion (WELLBUTRIN XL) 24 hr tablet 150 mg  150 mg Oral Daily Leontine Radman, MD      . cloNIDine (CATAPRES) tablet 0.2 mg  0.2 mg Oral QID Forde Dandy, MD   0.2 mg at 07/29/16 0919   Followed by  . [START ON 07/31/2016] cloNIDine (CATAPRES) tablet 0.1 mg  0.1 mg Oral BH-qamhs Forde Dandy, MD       Followed by  . [START ON 08/03/2016] cloNIDine (CATAPRES) tablet 0.1 mg  0.1 mg Oral QAC breakfast Forde Dandy, MD      . dicyclomine (BENTYL) tablet 20 mg  20 mg Oral Q6H PRN Davonna Belling, MD   20 mg at 07/29/16 9476  . gabapentin (NEURONTIN) capsule 200 mg  200 mg Oral TID  Corena Pilgrim, MD      . hydrOXYzine (ATARAX/VISTARIL) tablet 25 mg  25 mg Oral Q6H PRN Davonna Belling, MD      . loperamide (IMODIUM) capsule 2-4 mg  2-4 mg Oral PRN Davonna Belling, MD      . LORazepam (ATIVAN) tablet 1 mg  1 mg Oral Q8H PRN Davonna Belling, MD   1 mg at 07/29/16 6962  . methocarbamol (ROBAXIN) tablet 500 mg  500 mg Oral Q8H PRN Davonna Belling, MD   500 mg at 07/29/16 9528  .  ondansetron (ZOFRAN-ODT) disintegrating tablet 4 mg  4 mg Oral Q6H PRN Davonna Belling, MD   4 mg at 07/29/16 4132   Current Outpatient Prescriptions  Medication Sig Dispense Refill  . ibuprofen (ADVIL,MOTRIN) 800 MG tablet Take 800 mg by mouth every 8 (eight) hours as needed.    . methadone (DOLOPHINE) 10 MG tablet Take 80 mg by mouth every 8 (eight) hours.    Marland Kitchen ibuprofen (ADVIL,MOTRIN) 600 MG tablet Take 1 tablet (600 mg total) by mouth every 6 (six) hours as needed. (Patient not taking: Reported on 07/28/2016) 60 tablet 0  . oxyCODONE-acetaminophen (PERCOCET) 10-325 MG tablet Take 1 tablet by mouth every 4 (four) hours as needed (severe pain). (Patient not taking: Reported on 07/28/2016) 30 tablet 0    Musculoskeletal: Strength & Muscle Tone: within normal limits Gait & Station: normal Patient leans: N/A  Psychiatric Specialty Exam: Physical Exam  Psychiatric: Her speech is normal. Her mood appears anxious. She is slowed and withdrawn. Cognition and memory are normal. She expresses impulsivity. She exhibits a depressed mood. She expresses suicidal ideation. She expresses suicidal plans.    Review of Systems  Constitutional: Positive for chills, diaphoresis and malaise/fatigue.  HENT: Negative.   Eyes: Negative.   Respiratory: Negative.   Cardiovascular: Negative.   Gastrointestinal: Negative.   Genitourinary: Negative.   Musculoskeletal: Positive for myalgias.  Skin: Negative.   Neurological: Positive for tremors and weakness.  Endo/Heme/Allergies: Negative.   Psychiatric/Behavioral: Positive for depression, substance abuse and suicidal ideas. The patient is nervous/anxious.     Blood pressure (!) 165/96, pulse (!) 44, temperature 98.4 F (36.9 C), temperature source Oral, resp. rate 20, SpO2 91 %.There is no height or weight on file to calculate BMI.  General Appearance: Casual  Eye Contact:  Minimal  Speech:  Clear and Coherent  Volume:  Decreased  Mood:  Anxious, Depressed  and Hopeless  Affect:  Constricted  Thought Process:  Coherent and Descriptions of Associations: Intact  Orientation:  Full (Time, Place, and Person)  Thought Content:  Logical  Suicidal Thoughts:  Yes.  with intent/plan  Homicidal Thoughts:  No  Memory:  Immediate;   Fair Recent;   Fair Remote;   Fair  Judgement:  Poor  Insight:  Lacking  Psychomotor Activity:  Restlessness  Concentration:  Concentration: Fair and Attention Span: Poor  Recall:  Good  Fund of Knowledge:  Good  Language:  Good  Akathisia:  No  Handed:  Right  AIMS (if indicated):     Assets:  Communication Skills  ADL's:  Intact  Cognition:  WNL  Sleep:   poor     Treatment Plan Summary: Daily contact with patient to assess and evaluate symptoms and progress in treatment and Medication management  Continue Opioid detox protocol Add Gabapentin 200 mg tid for mood/withdrawal Add Wellbutrin XL 150 mg daily for depression.  Disposition: Recommend psychiatric Inpatient admission when medically cleared.  Corena Pilgrim, MD 07/29/2016 11:56  AM

## 2016-07-29 NOTE — ED Notes (Signed)
Patient has been in her room complaining of pain from withdrawal and post partum pain.  Patient is restless and irritable. Patient has been in her room restless and irritable, but has not been in any dyscontrol.

## 2016-07-29 NOTE — ED Notes (Signed)
Report to include situation, background, assessment and recommendations from [facility]. Patient sleeping, respirations regular and unlabored. Q15 minute rounds and security camera observation to continue.

## 2016-07-30 LAB — BASIC METABOLIC PANEL
ANION GAP: 7 (ref 5–15)
BUN: 12 mg/dL (ref 6–20)
CHLORIDE: 106 mmol/L (ref 101–111)
CO2: 26 mmol/L (ref 22–32)
Calcium: 8.9 mg/dL (ref 8.9–10.3)
Creatinine, Ser: 0.82 mg/dL (ref 0.44–1.00)
GFR calc non Af Amer: >60 mL/min (ref >60)
GLUCOSE: 104 mg/dL — AB (ref 65–99)
POTASSIUM: 3.9 mmol/L (ref 3.5–5.1)
Sodium: 139 mmol/L (ref 135–145)

## 2016-07-30 LAB — CBC WITH DIFFERENTIAL/PLATELET
BASOS ABS: 0 10*3/uL (ref 0.0–0.1)
BASOS PCT: 0 %
Eosinophils Absolute: 0.1 10*3/uL (ref 0.0–0.7)
Eosinophils Relative: 1 %
HEMATOCRIT: 29.6 % — AB (ref 36.0–46.0)
HEMOGLOBIN: 10 g/dL — AB (ref 12.0–15.0)
LYMPHS PCT: 18 %
Lymphs Abs: 1.7 10*3/uL (ref 0.7–4.0)
MCH: 29.9 pg (ref 26.0–34.0)
MCHC: 33.8 g/dL (ref 30.0–36.0)
MCV: 88.4 fL (ref 78.0–100.0)
Monocytes Absolute: 0.7 10*3/uL (ref 0.1–1.0)
Monocytes Relative: 7 %
NEUTROS ABS: 6.7 10*3/uL (ref 1.7–7.7)
NEUTROS PCT: 74 %
Platelets: 466 10*3/uL — ABNORMAL HIGH (ref 150–400)
RBC: 3.35 MIL/uL — ABNORMAL LOW (ref 3.87–5.11)
RDW: 14.1 % (ref 11.5–15.5)
WBC: 9.1 10*3/uL (ref 4.0–10.5)

## 2016-07-30 MED ORDER — LOSARTAN POTASSIUM 50 MG PO TABS
100.0000 mg | ORAL_TABLET | Freq: Every day | ORAL | Status: DC
  Administered 2016-07-30: 100 mg via ORAL
  Filled 2016-07-30: qty 2

## 2016-07-30 MED ORDER — LORAZEPAM 1 MG PO TABS
2.0000 mg | ORAL_TABLET | Freq: Once | ORAL | Status: AC
  Administered 2016-07-30: 2 mg via ORAL
  Filled 2016-07-30: qty 2

## 2016-07-30 NOTE — ED Notes (Signed)
Dr Zenia Resides at bedside to reeval pt.  Reports pt seems comfortable, IV fluids not needed at this time.  Pt continues to push po fluids.

## 2016-07-30 NOTE — ED Notes (Signed)
Hourly rounding reveals patient in room. Stable, in no acute distress. Q15 minute rounds and monitoring via [employer] to continue.

## 2016-07-30 NOTE — ED Notes (Signed)
Pt resting at present, no distress noted, calm & cooperative.  Pushing fluids at present, tolerating at present.  Pending vital signs recheck for [facility] admit.  Monitoring for safety, Q 15 min checks in effect.

## 2016-07-30 NOTE — ED Notes (Signed)
Provided gatorade for pt

## 2016-07-30 NOTE — Progress Notes (Signed)
07/30/16 1354:  LRT went to pt room to offer activities, pt was sleep.  Victorino Sparrow, LRT/CTRS

## 2016-07-30 NOTE — ED Notes (Signed)
Hourly rounding reveals patient sleeping in room. No complaints, stable, in no acute distress. Q15 minute rounds and monitoring via Security Cameras to continue. 

## 2016-07-30 NOTE — ED Notes (Signed)
Attempted to call venipuncture. No answer.; Will call again.

## 2016-07-30 NOTE — Progress Notes (Signed)
Pt lying in bed at present. Continue to require verbal prompts to increase fluid intake with little effect. Vital done BP elevated and HR low (High 55-58). Dr. Zenia Resides made aware. New order received for Ativan 2 mg PO per CIWA score 13 and to continue to encourage pt to increase fluid intake. Q 15 minutes safety checks done without self harm gestures thus far.

## 2016-07-30 NOTE — ED Notes (Signed)
Lab returned call and said, "I'll be over there as soon as I can."

## 2016-07-30 NOTE — ED Provider Notes (Addendum)
Pt evaluated at bedside. Called by RN relaying concern of [facility] staff re:  Pt heart rate between 42-53.  Pt complains of generalized myalgias. Likely 2/2 opiate withdrawal.  Pt is 4 days post partum from SVD. Repeat Hb to day up to 10.  BMP including K+, normal.   Pt with clear lungs. No CHF sx. Hypertensive, on clonidine.  Pt presented in labor to [facility] on 15th, rating labor pain 10/10, with heart rate of 53.  HELLP labs yesterday normal. Pt without clonus or LE edema today. Doubt eclampsia. No proteinuria.  Pt reports history of PP HTN after prior pregnancy.  Discussion: Patient 4 days postpartum with stable/improving hemoglobin, and stable electrolytes. Hypertensive. But not eclamptic.  Bradycardia but sinus without block on EKG. No CHF symptoms. Bradycardia does not need attention or treatment. Clonidine can contribute to bradycardia (5%).  Will discontinue clonidine. Start Cozaar for hypertension.  Recommend NO QT prolonging medications. Pt QTC .79  Pt medically stable for psychiatric disposition.   Tanna Furry, MD 07/30/16 [place], MD 07/30/16 [phone]

## 2016-07-30 NOTE — ED Provider Notes (Signed)
Patient seen due to alcohol withdrawal symptoms. Was medicated with Ativan IV as well as given Zofran. There was concern by nursing that patient wasn't drinking adequate oral fluids. Patient has been able to increase her oral intake after the Ativan and Zofran. She is not tremors at this time. Patient's alcohol withdrawal score noted. Patient stable to go to behavioral health   Lacretia Leigh, MD 07/30/16 2022

## 2016-07-30 NOTE — ED Notes (Signed)
This morning pt's heart rate continues to be low. She reports feeling very bad. Dr. Jeneen Rinks, EDP, was informed and he ordered EKG and labs to be drawn, which was accomplished. Medications were changed because of pt's bradycardia and hypertension and given as ordered.

## 2016-07-31 ENCOUNTER — Inpatient Hospital Stay (HOSPITAL_COMMUNITY)
Admission: AD | Admit: 2016-07-31 | Discharge: 2016-08-03 | DRG: 897 | Disposition: A | Discharge status: Home / Self Care | Payer: Medicaid Other | Source: Intra-hospital | Attending: Psychiatry | Admitting: Psychiatry

## 2016-07-31 ENCOUNTER — Encounter (HOSPITAL_COMMUNITY): Payer: Self-pay | Admitting: *Deleted

## 2016-07-31 ENCOUNTER — Ambulatory Visit (HOSPITAL_COMMUNITY): Payer: Medicaid Other

## 2016-07-31 DIAGNOSIS — R05 Cough: Secondary | ICD-10-CM

## 2016-07-31 DIAGNOSIS — F1123 Opioid dependence with withdrawal: Secondary | ICD-10-CM | POA: Diagnosis present

## 2016-07-31 DIAGNOSIS — F3112 Bipolar disorder, current episode manic without psychotic features, moderate: Secondary | ICD-10-CM | POA: Diagnosis not present

## 2016-07-31 DIAGNOSIS — Z915 Personal history of self-harm: Secondary | ICD-10-CM | POA: Diagnosis not present

## 2016-07-31 DIAGNOSIS — G4700 Insomnia, unspecified: Secondary | ICD-10-CM | POA: Diagnosis present

## 2016-07-31 DIAGNOSIS — F411 Generalized anxiety disorder: Secondary | ICD-10-CM | POA: Diagnosis present

## 2016-07-31 DIAGNOSIS — R059 Cough, unspecified: Secondary | ICD-10-CM

## 2016-07-31 DIAGNOSIS — R45851 Suicidal ideations: Secondary | ICD-10-CM | POA: Diagnosis present

## 2016-07-31 DIAGNOSIS — Z79899 Other long term (current) drug therapy: Secondary | ICD-10-CM | POA: Diagnosis not present

## 2016-07-31 DIAGNOSIS — F329 Major depressive disorder, single episode, unspecified: Secondary | ICD-10-CM | POA: Diagnosis present

## 2016-07-31 DIAGNOSIS — F17210 Nicotine dependence, cigarettes, uncomplicated: Secondary | ICD-10-CM | POA: Diagnosis present

## 2016-07-31 DIAGNOSIS — Z885 Allergy status to narcotic agent status: Secondary | ICD-10-CM | POA: Diagnosis not present

## 2016-07-31 DIAGNOSIS — I10 Essential (primary) hypertension: Secondary | ICD-10-CM | POA: Diagnosis present

## 2016-07-31 DIAGNOSIS — F319 Bipolar disorder, unspecified: Secondary | ICD-10-CM | POA: Diagnosis present

## 2016-07-31 LAB — URINALYSIS, ROUTINE W REFLEX MICROSCOPIC
Bilirubin Urine: NEGATIVE
Glucose, UA: NEGATIVE mg/dL
Ketones, ur: NEGATIVE mg/dL
Nitrite: NEGATIVE
PROTEIN: 100 mg/dL — AB
SPECIFIC GRAVITY, URINE: 1.017 (ref 1.005–1.030)
pH: 7 (ref 5.0–8.0)

## 2016-07-31 MED ORDER — GABAPENTIN 100 MG PO CAPS
200.0000 mg | ORAL_CAPSULE | Freq: Three times a day (TID) | ORAL | Status: DC
  Administered 2016-07-31 – 2016-08-01 (×4): 200 mg via ORAL
  Filled 2016-07-31 (×8): qty 2

## 2016-07-31 MED ORDER — ACETAMINOPHEN 325 MG PO TABS
650.0000 mg | ORAL_TABLET | ORAL | Status: DC | PRN
  Administered 2016-07-31 – 2016-08-03 (×9): 650 mg via ORAL
  Filled 2016-07-31 (×10): qty 2

## 2016-07-31 MED ORDER — NICOTINE 21 MG/24HR TD PT24
21.0000 mg | MEDICATED_PATCH | Freq: Every day | TRANSDERMAL | Status: DC
  Administered 2016-07-31 – 2016-08-03 (×4): 21 mg via TRANSDERMAL
  Filled 2016-07-31 (×6): qty 1

## 2016-07-31 MED ORDER — HYDROXYZINE HCL 25 MG PO TABS
25.0000 mg | ORAL_TABLET | Freq: Four times a day (QID) | ORAL | Status: DC | PRN
  Administered 2016-07-31 (×3): 25 mg via ORAL
  Filled 2016-07-31 (×3): qty 1

## 2016-07-31 MED ORDER — CLONIDINE HCL 0.1 MG PO TABS
0.1000 mg | ORAL_TABLET | Freq: Four times a day (QID) | ORAL | Status: AC
  Administered 2016-07-31 – 2016-08-01 (×7): 0.1 mg via ORAL
  Filled 2016-07-31 (×9): qty 1

## 2016-07-31 MED ORDER — DICYCLOMINE HCL 20 MG PO TABS
20.0000 mg | ORAL_TABLET | Freq: Four times a day (QID) | ORAL | Status: DC | PRN
  Administered 2016-07-31 – 2016-08-02 (×5): 20 mg via ORAL
  Filled 2016-07-31 (×5): qty 1

## 2016-07-31 MED ORDER — ONDANSETRON 4 MG PO TBDP
4.0000 mg | ORAL_TABLET | Freq: Four times a day (QID) | ORAL | Status: DC | PRN
  Administered 2016-07-31 – 2016-08-01 (×2): 4 mg via ORAL
  Filled 2016-07-31 (×2): qty 1

## 2016-07-31 MED ORDER — METHOCARBAMOL 500 MG PO TABS
500.0000 mg | ORAL_TABLET | Freq: Three times a day (TID) | ORAL | Status: DC | PRN
  Administered 2016-07-31: 500 mg via ORAL
  Filled 2016-07-31: qty 1

## 2016-07-31 MED ORDER — NAPROXEN 500 MG PO TABS
500.0000 mg | ORAL_TABLET | Freq: Two times a day (BID) | ORAL | Status: DC | PRN
  Administered 2016-07-31 – 2016-08-02 (×5): 500 mg via ORAL
  Filled 2016-07-31 (×5): qty 1

## 2016-07-31 MED ORDER — CLONIDINE HCL 0.1 MG PO TABS
0.1000 mg | ORAL_TABLET | Freq: Every day | ORAL | Status: DC
Start: 2016-08-04 — End: 2016-08-03
  Filled 2016-07-31: qty 1

## 2016-07-31 MED ORDER — BUPROPION HCL ER (XL) 150 MG PO TB24
150.0000 mg | ORAL_TABLET | Freq: Every day | ORAL | Status: DC
  Administered 2016-07-31 – 2016-08-03 (×4): 150 mg via ORAL
  Filled 2016-07-31 (×6): qty 1

## 2016-07-31 MED ORDER — LOPERAMIDE HCL 2 MG PO CAPS: 2.0000 mg | ORAL_CAPSULE | ORAL | Status: DC | PRN

## 2016-07-31 MED ORDER — CLONIDINE HCL 0.1 MG PO TABS
0.1000 mg | ORAL_TABLET | ORAL | Status: DC
  Administered 2016-08-02 – 2016-08-03 (×3): 0.1 mg via ORAL
  Filled 2016-07-31 (×4): qty 1

## 2016-07-31 NOTE — Progress Notes (Signed)
31 year old female pt admitted on voluntary basis. Kelly [name] reports feeling depressed, suicidal and needing detox from heroin. Kelly [name] reports that she had a baby a few days ago, went out and used and came back to the hospital looking to detox and get to a treatment facility. Kelly [name] does endorse passive SI on admission but able to contract for safety in the hospital. Kelly [name] also reports that she has been on medications in the past to address her depression, reports she has not been on them in awhile and would like to be started back on something while she is here. Kelly [name] was oriented to the unit and safety maintained.

## 2016-07-31 NOTE — Progress Notes (Addendum)
Patient continues to complain of head pain.  She states that she hit her head before admission and she was "knocked out."  She has an abrasion above her left eye.  CT scan was not performed at ED.  Ted hose has been ordered for patient as we do not carry this product at [facility].  Ted hose should arrive tomorrow.  Patient is visible in the milieu; she appears to be feeling slightly better.  She continues to receive meals in her room.  She has been on the phone several times.  Cabbage was placed on bilateral breasts for pain and swelling.  Patient is lactating due to postpartum delivery.

## 2016-07-31 NOTE — [facility] Suicide Risk Assessment (Signed)
[place] INPATIENT:  Family/Significant Other Suicide Prevention Education  Suicide Prevention Education:  Contact Attempts: Lorice Lafave (pts' mother) [phone] has been identified by the patient as the family member/significant other with whom the patient will be residing, and identified as the person(s) who will aid the patient in the event of a mental health crisis.  With written consent from the patient, two attempts were made to provide suicide prevention education, prior to and/or following the patient's discharge.  We were unsuccessful in providing suicide prevention education.  A suicide education pamphlet was given to the patient to share with family/significant other.  Date and time of first attempt: 07/31/16 at 10:40AM (message left for pt's mother requesting call back at her earliest convenience).  Date and time of second attempt:  [name] LCSW 07/31/2016, 10:43 AM   CSW spoke with pt's mother Nathali Vent. She is hoping that pt goes to treatment after detox "She will die if she doesn't. She is so far gone in her addiction." SPE completed; aftercare options reviewed.  Maxie Better, MSW, LCSW Clinical Social Worker 07/31/2016 12:17 PM

## 2016-07-31 NOTE — [facility] Group Notes (Signed)
[place] LCSW Group Therapy  07/31/2016 1:59 PM  Type of Therapy:  Group Therapy  Participation Level:  Did Not Attend-pt invited. Chose to remain in bed.   Summary of Progress/Problems: [facility] Speaker came to talk about his personal journey with substance abuse and addiction. The pt processed ways by which to relate to the speaker. [place] speaker provided handouts and educational information pertaining to groups and services offered by the [facility].   Nataline Basara N Smart LCSW 07/31/2016, 1:59 PM

## 2016-07-31 NOTE — Progress Notes (Signed)
CSW called CPS worker Marry Guan [phone] to notify him of pt's admission to the hospital. Mr. Sherral Hammers stated that he visited with the patient on 3/19 when she was at [facility] and would follow-up with her upon discharge.  Maxie Better, MSW, LCSW Clinical Social Worker 07/31/2016 10:45 AM

## 2016-07-31 NOTE — BHH Suicide Risk Assessment (Signed)
[facility] Admission Suicide Risk Assessment   Nursing information obtained from:    Demographic factors:    Current Mental Status:    Loss Factors:    Historical Factors:    Risk Reduction Factors:     Total Time spent with patient: 45 minutes Principal Problem: opiate dependence Diagnosis:   Patient Active Problem List   Diagnosis Date Noted  . Bipolar disorder ([place]) [F31.9] 07/31/2016  . Major depressive disorder, recurrent episode with anxious distress ([place]) [F33.9] 07/29/2016  . Normal labor [O80, Z37.9] 07/26/2016  . Foreign body granuloma of soft tissue, NEC, right upper arm [M60.221]   . Pyelonephritis [N12] 05/28/2016  . Polysubstance abuse [F19.10] 05/25/2016  . No prenatal care in current pregnancy [O09.30] 05/25/2016  . Chronic hypertension affecting pregnancy [O10.919] 05/25/2016  . History of cesarean delivery affecting pregnancy [O34.219] 05/25/2016  . Community acquired pneumonia [J18.9] 05/25/2016  . Pyelonephritis affecting pregnancy [O23.00] 05/24/2016  . Palliative care encounter [Z51.5]   . Abscess of right breast [N61.1] 07/13/2014  . Opioid use disorder, severe, dependence ([place]) [F11.20] 08/25/2013  . Unspecified episodic mood disorder [F39] 08/25/2013  . ADHD (attention deficit hyperactivity disorder) [F90.9] 08/25/2013  . Cocaine abuse [F14.10] 08/25/2013  . Thoracic back pain [M54.6] 01/29/2013  . CROHN'S DISEASE [K50.90] 05/03/2005  . COLONIC POLYPS [D12.6] 02/28/2003   Subjective Data: Patient is a 31YO female with a multi-year history of opiate dependence who presented for detox and suicidal ideation.Patient also reported a history of depression. Patient was just discharged from the [facility] after having given birt to a child. The child is currently still in the NICU. She was discharged and went directly to use IV heroin. She also admitted to cocaine abuse.. Also, patient reported increasing depressive symptoms, hopelessness and suicidal thoughts. She  is concerned about her new born baby who is currently in NICU detoxing. Patient stated that she no longer wants to live and unable to contract for safety in the ED. She denied SI on the inpatient psychiatric unit. Physically she feels poorly. She had an episode of bradycardia in the ED which was apparently felt not to be significant. She has not been eating or drinking well. She has a significant cough but normal WBC.  Continued Clinical Symptoms:  Alcohol Use Disorder Identification Test Final Score (AUDIT): 5 The "Alcohol Use Disorders Identification Test", Guidelines for Use in Primary Care, Second Edition.  World Pharmacologist [name]). Score between 0-7:  no or low risk or alcohol related problems. Score between 8-15:  moderate risk of alcohol related problems. Score between 16-19:  high risk of alcohol related problems. Score 20 or above:  warrants further diagnostic evaluation for alcohol dependence and treatment.   CLINICAL FACTORS:   Depression:   Anhedonia Comorbid alcohol abuse/dependence Hopelessness Impulsivity Insomnia Alcohol/Substance Abuse/Dependencies   Musculoskeletal: Strength & Muscle Tone: within normal limits Gait & Station: unsteady Patient leans: N/A  Psychiatric Specialty Exam: Physical Exam  Nursing note and vitals reviewed.   ROS  Blood pressure (!) 169/92, pulse (!) 55, temperature 98.4 F (36.9 C), temperature source Oral, resp. rate 18, height 5' 7"  (1.702 m), weight 78.9 kg (174 lb).Body mass index is 27.25 kg/m.  General Appearance: Disheveled  Eye Contact:  Fair  Speech:  Slow  Volume:  Decreased  Mood:  Depressed  Affect:  Appropriate  Thought Process:  Goal Directed  Orientation:  Full (Time, Place, and Person)  Thought Content:  Logical  Suicidal Thoughts:  No  Homicidal Thoughts:  No  Memory:  Immediate;   Fair  Judgement:  Impaired  Insight:  Lacking  Psychomotor Activity:  Decreased  Concentration:  Concentration: Fair   Recall:  [employer] of Knowledge:  Fair  Language:  Fair  Akathisia:  No  Handed:  Right  AIMS (if indicated):     Assets:  Desire for Improvement  ADL's:  Intact  Cognition:  WNL  Sleep:  Number of Hours: 2.25      COGNITIVE FEATURES THAT CONTRIBUTE TO RISK:  Thought constriction (tunnel vision)    SUICIDE RISK:   Minimal: No identifiable suicidal ideation.  Patients presenting with no risk factors but with morbid ruminations; may be classified as minimal risk based on the severity of the depressive symptoms  PLAN OF CARE: Patient is seen and examined. Patient is a 31 YO female with the above stated PPHx who was admitted for SI and detox. She is symptomatic from withdrawal and we will treat this. We will push fluids because of her poor PO intake. Because of her cough and recent hospitalization we will get a CXR. Her calves are not tender but because of recent delivery she is at risk for DVT/PE and we will monitor her symptoms. We will use the opiate protocol to treat her substance issues.  I certify that inpatient services furnished can reasonably be expected to improve the patient's condition.   Sharma Covert, MD 07/31/2016, 10:07 AM

## 2016-07-31 NOTE — Plan of Care (Signed)
Problem: Safety: Goal: Periods of time without injury will increase Outcome: Progressing Patient contracts for safety on the unit. Patient is on q15 minute safety checks.
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
## 2016-07-31 NOTE — ED Notes (Signed)
Report given to RN [place], [facility].  Pending [place] transfer to [facility].

## 2016-07-31 NOTE — H&P (Signed)
Psychiatric Admission Assessment Adult  Patient Identification: Kelly [name] MRN:  [id number] Date of Evaluation:  07/31/2016 Chief Complaint:  Bipolar I disorder, current episode depressed Cocaine Use Disorder, Severe Opioid Use Disorder, Severe Principal Diagnosis: opiate dependence Diagnosis:   Patient Active Problem List   Diagnosis Date Noted  . Bipolar disorder ([place]) [F31.9] 07/31/2016  . Major depressive disorder, recurrent episode with anxious distress ([place]) [F33.9] 07/29/2016  . Normal labor [O80, Z37.9] 07/26/2016  . Foreign body granuloma of soft tissue, NEC, right upper arm [M60.221]   . Pyelonephritis [N12] 05/28/2016  . Polysubstance abuse [F19.10] 05/25/2016  . No prenatal care in current pregnancy [O09.30] 05/25/2016  . Chronic hypertension affecting pregnancy [O10.919] 05/25/2016  . History of cesarean delivery affecting pregnancy [O34.219] 05/25/2016  . Community acquired pneumonia [J18.9] 05/25/2016  . Pyelonephritis affecting pregnancy [O23.00] 05/24/2016  . Palliative care encounter [Z51.5]   . Abscess of right breast [N61.1] 07/13/2014  . Opioid use disorder, severe, dependence ([place]) [F11.20] 08/25/2013  . Unspecified episodic mood disorder [F39] 08/25/2013  . ADHD (attention deficit hyperactivity disorder) [F90.9] 08/25/2013  . Cocaine abuse [F14.10] 08/25/2013  . Thoracic back pain [M54.6] 01/29/2013  . CROHN'S DISEASE [K50.90] 05/03/2005  . COLONIC POLYPS [D12.6] 02/28/2003   History of Present Illness: Patient is a 31YO female with a multi-year history of opiate dependence who presented for detox and suicidal ideation.Patient also reported a history of depression. Patient was just discharged from the [facility] after having given birt to a child. The child is currently still in the NICU. She was discharged and went directly to use IV heroin. She also admitted to cocaine abuse.. Also, patient reported increasing depressive symptoms, hopelessness  and suicidal thoughts. She is concerned about her new born baby who is currently in NICU detoxing. Patient stated that she no longer wants to live and unable to contract for safety in the ED. She denied SI on the inpatient psychiatric unit. Physically she feels poorly. She had an episode of bradycardia in the ED which was apparently felt not to be significant. She has not been eating or drinking well. She has a significant cough but normal WBC. Associated Signs/Symptoms: Depression Symptoms:  depressed mood, anhedonia, insomnia, psychomotor retardation, fatigue, feelings of worthlessness/guilt, difficulty concentrating, hopelessness, suicidal thoughts with specific plan, anxiety, loss of energy/fatigue, disturbed sleep, (Hypo) Manic Symptoms:  Impulsivity, Anxiety Symptoms:  Excessive Worry, Psychotic Symptoms:  none PTSD Symptoms: NA Total Time spent with patient: 45 minutes  Past Psychiatric History: 2 previous admissions for detox, 1 previous rehab.  Is the patient at risk to self? Yes.    Has the patient been a risk to self in the past 6 months? Yes.    Has the patient been a risk to self within the distant past? Yes.    Is the patient a risk to others? No.  Has the patient been a risk to others in the past 6 months? No.  Has the patient been a risk to others within the distant past? No.   Prior Inpatient Therapy:   Prior Outpatient Therapy:    Alcohol Screening: 1. How often do you have a drink containing alcohol?: 2 to 4 times a month 2. How many drinks containing alcohol do you have on a typical day when you are drinking?: 3 or 4 3. How often do you have six or more drinks on one occasion?: Monthly Preliminary Score: 3 4. How often during the last year have you found that you were not able  to stop drinking once you had started?: Never 5. How often during the last year have you failed to do what was normally expected from you becasue of drinking?: Never 6. How often  during the last year have you needed a first drink in the morning to get yourself going after a heavy drinking session?: Never 7. How often during the last year have you had a feeling of guilt of remorse after drinking?: Never 8. How often during the last year have you been unable to remember what happened the night before because you had been drinking?: Never 9. Have you or someone else been injured as a result of your drinking?: No 10. Has a relative or friend or a doctor or another health worker been concerned about your drinking or suggested you cut down?: No Alcohol Use Disorder Identification Test Final Score (AUDIT): 5 Brief Intervention: AUDIT score less than 7 or less-screening does not suggest unhealthy drinking-brief intervention not indicated Substance Abuse History in the last 12 months:  Yes.   Consequences of Substance Abuse: Legal Consequences:  child taken away by court Family Consequences:  child in custody of patient's mother Withdrawal Symptoms:   Cramps Diaphoresis Diarrhea Headaches Nausea Tremors Vomiting Previous Psychotropic Medications: Yes  Psychological Evaluations: Yes  Past Medical History:  Past Medical History:  Diagnosis Date  . Abscess of breast    rt breast  . ADD (attention deficit disorder)   . Back pain   . Bipolar 1 disorder ([place])   . Hypertension   . Polysubstance abuse   . Suicidal overdose [name])     Past Surgical History:  Procedure Laterality Date  . BACK SURGERY     scoliosis-in care everywhere 08/1999  . CESAREAN SECTION    . CHOLECYSTECTOMY  March 2015  . FOREIGN BODY REMOVAL Right 05/28/2016   Procedure: REMOVAL FOREIGN BODY RIGHT ARM;  Surgeon: Meredith Pel, MD;  Location: [place];  Service: Orthopedics;  Laterality: Right;  . IRRIGATION AND DEBRIDEMENT ABSCESS Right 07/13/2014   Procedure: IRRIGATION AND DEBRIDEMEN TRIGHT BREAST ABSCESS;  Surgeon: Donnie Mesa, MD;  Location: [place];  Service: General;  Laterality: Right;    Family History:  Family History  Problem Relation Age of Onset  . Diabetes Mother   . Diabetes Father   . Hypertension Father   . Heart attack Neg Hx   . Hyperlipidemia Neg Hx   . Sudden death Neg Hx    Family Psychiatric  History: Brothers with substance abuse issues Tobacco Screening: Have you used any form of tobacco in the last 30 days? (Cigarettes, Smokeless Tobacco, Cigars, and/or Pipes): Yes Tobacco use, Select all that apply: 5 or more cigarettes per day Are you interested in Tobacco Cessation Medications?: Yes, will notify MD for an order Counseled patient on smoking cessation including recognizing danger situations, developing coping skills and basic information about quitting provided: Refused/Declined practical counseling Social History:  History  Alcohol Use  . Yes    Comment: one or two drinks weekly     History  Drug Use  . Types: Cocaine, Heroin, IV    Comment: heroin, crack cocaine    Additional Social History:                           Allergies:   Allergies  Allergen Reactions  . Hydrocodone Itching  . Morphine And Related Nausea And Vomiting   Lab Results:  Results for orders placed or performed during  the hospital encounter of 07/28/16 (from the past 48 hour(s))  Urinalysis, Routine w reflex microscopic     Status: Abnormal   Collection Time: 07/29/16  3:28 PM  Result Value Ref Range   Color, Urine STRAW (A) YELLOW   APPearance CLEAR CLEAR   Specific Gravity, Urine 1.004 (L) 1.005 - 1.030   pH 6.0 5.0 - 8.0   Glucose, UA NEGATIVE NEGATIVE mg/dL   Hgb urine dipstick MODERATE (A) NEGATIVE   Bilirubin Urine NEGATIVE NEGATIVE   Ketones, ur NEGATIVE NEGATIVE mg/dL   Protein, ur NEGATIVE NEGATIVE mg/dL   Nitrite NEGATIVE NEGATIVE   Leukocytes, UA NEGATIVE NEGATIVE   RBC / HPF 0-5 0 - 5 RBC/hpf   WBC, UA 0-5 0 - 5 WBC/hpf   Bacteria, UA NONE SEEN NONE SEEN   Squamous Epithelial / LPF 0-5 (A) NONE SEEN  CBC with Differential/Platelet      Status: Abnormal   Collection Time: 07/30/16 10:00 AM  Result Value Ref Range   WBC 9.1 4.0 - 10.5 K/uL   RBC 3.35 (L) 3.87 - 5.11 MIL/uL   Hemoglobin 10.0 (L) 12.0 - 15.0 g/dL   HCT 29.6 (L) 36.0 - 46.0 %   MCV 88.4 78.0 - 100.0 fL   MCH 29.9 26.0 - 34.0 pg   MCHC 33.8 30.0 - 36.0 g/dL   RDW 14.1 11.5 - 15.5 %   Platelets 466 (H) 150 - 400 K/uL   Neutrophils Relative % 74 %   Neutro Abs 6.7 1.7 - 7.7 K/uL   Lymphocytes Relative 18 %   Lymphs Abs 1.7 0.7 - 4.0 K/uL   Monocytes Relative 7 %   Monocytes Absolute 0.7 0.1 - 1.0 K/uL   Eosinophils Relative 1 %   Eosinophils Absolute 0.1 0.0 - 0.7 K/uL   Basophils Relative 0 %   Basophils Absolute 0.0 0.0 - 0.1 K/uL  Basic metabolic panel     Status: Abnormal   Collection Time: 07/30/16 10:00 AM  Result Value Ref Range   Sodium 139 135 - 145 mmol/L   Potassium 3.9 3.5 - 5.1 mmol/L   Chloride 106 101 - 111 mmol/L   CO2 26 22 - 32 mmol/L   Glucose, Bld 104 (H) 65 - 99 mg/dL   BUN 12 6 - 20 mg/dL   Creatinine, Ser 0.82 0.44 - 1.00 mg/dL   Calcium 8.9 8.9 - 10.3 mg/dL   GFR calc non Af Amer >60 >60 mL/min   GFR calc Af Amer >60 >60 mL/min    Comment: (NOTE) The eGFR has been calculated using the CKD EPI equation. This calculation has not been validated in all clinical situations. eGFR's persistently <60 mL/min signify possible Chronic Kidney Disease.    Anion gap 7 5 - 15    Blood Alcohol level:  Lab Results  Component Value Date   ETH <5 07/28/2016   ETH <11 83/41/9622    Metabolic Disorder Labs:  Lab Results  Component Value Date   HGBA1C 4.9 05/24/2016   MPG 94 05/24/2016   No results found for: PROLACTIN No results found for: CHOL, TRIG, HDL, CHOLHDL, VLDL, LDLCALC  Current Medications: Current Facility-Administered Medications  Medication Dose Route Frequency Provider Last Rate Last Dose  . acetaminophen (TYLENOL) tablet 650 mg  650 mg Oral Q4H PRN Shuvon B Rankin, NP   650 mg at 07/31/16 0644  .  buPROPion (WELLBUTRIN XL) 24 hr tablet 150 mg  150 mg Oral Daily Shuvon B Rankin, NP   150 mg  at 07/31/16 0801  . cloNIDine (CATAPRES) tablet 0.1 mg  0.1 mg Oral QID Sharma Covert, MD   0.1 mg at 07/31/16 0932   Followed by  . [START ON 08/02/2016] cloNIDine (CATAPRES) tablet 0.1 mg  0.1 mg Oral BH-qamhs Sharma Covert, MD       Followed by  . [START ON 08/04/2016] cloNIDine (CATAPRES) tablet 0.1 mg  0.1 mg Oral QAC breakfast Sharma Covert, MD      . dicyclomine (BENTYL) tablet 20 mg  20 mg Oral Q6H PRN Shuvon B Rankin, NP   20 mg at 07/31/16 0917  . gabapentin (NEURONTIN) capsule 200 mg  200 mg Oral TID Shuvon B Rankin, NP   200 mg at 07/31/16 0801  . hydrOXYzine (ATARAX/VISTARIL) tablet 25 mg  25 mg Oral Q6H PRN Shuvon B Rankin, NP   25 mg at 07/31/16 0917  . loperamide (IMODIUM) capsule 2-4 mg  2-4 mg Oral PRN Shuvon B Rankin, NP      . methocarbamol (ROBAXIN) tablet 500 mg  500 mg Oral Q8H PRN Shuvon B Rankin, NP   500 mg at 07/31/16 0917  . naproxen (NAPROSYN) tablet 500 mg  500 mg Oral BID PRN Sharma Covert, MD      . nicotine (NICODERM CQ - dosed in mg/24 hours) patch 21 mg  21 mg Transdermal Daily Jenne Campus, MD   21 mg at 07/31/16 0801  . ondansetron (ZOFRAN-ODT) disintegrating tablet 4 mg  4 mg Oral Q6H PRN Shuvon B Rankin, NP   4 mg at 07/31/16 6712   PTA Medications: No prescriptions prior to admission.    Musculoskeletal: Strength & Muscle Tone: within normal limits Gait & Station: unsteady Patient leans: N/A  Psychiatric Specialty Exam: Physical Exam  Nursing note and vitals reviewed.   ROS  Blood pressure (!) 169/92, pulse (!) 55, temperature 98.4 F (36.9 C), temperature source Oral, resp. rate 18, height 5' 7"  (1.702 m), weight 78.9 kg (174 lb).Body mass index is 27.25 kg/m.  General Appearance: Disheveled  Eye Contact:  Minimal  Speech:  Slow  Volume:  Decreased  Mood:  Depressed  Affect:  Depressed  Thought Process:  Coherent  Orientation:   Full (Time, Place, and Person)  Thought Content:  Logical  Suicidal Thoughts:  No  Homicidal Thoughts:  No  Memory:  Immediate;   Fair  Judgement:  Impaired  Insight:  Lacking  Psychomotor Activity:  Restlessness  Concentration:  Concentration: Fair  Recall:  [employer] of Knowledge:  Fair  Language:  Fair  Akathisia:  No  Handed:  Right  AIMS (if indicated):     Assets:  Desire for Improvement  ADL's:  Intact  Cognition:  WNL  Sleep:  Number of Hours: 2.25    Treatment Plan Summary: Daily contact with patient to assess and evaluate symptoms and progress in treatment, Medication management and Plan Patient is seen and examined. Patient is a 31 YO female with the above stated PPHx who was admitted for SI and detox. She is symptomatic from withdrawal and we will treat this. We will push fluids because of her poor PO intake. Because of her cough and recent hospitalization we will get a CXR. Her calves are not tender but because of recent delivery she is at risk for DVT/PE and we will monitor her symptoms. We will use the opiate protocol to treat her substance issues.  Observation Level/Precautions:  Detox 15 minute checks  Laboratory:  CBC Chemistry  Profile Folic Acid GGT HCG UDS  Psychotherapy:    Medications:    Consultations:    Discharge Concerns:    Estimated LOS:  Other:     Physician Treatment Plan for Primary Diagnosis: opiate dependence, opiate withdrawal Long Term Goal(s): Improvement in symptoms so as ready for discharge  Short Term Goals: Ability to identify changes in lifestyle to reduce recurrence of condition will improve, Ability to verbalize feelings will improve, Ability to disclose and discuss suicidal ideas, Ability to demonstrate self-control will improve, Ability to identify and develop effective coping behaviors will improve, Ability to maintain clinical measurements within normal limits will improve, Compliance with prescribed medications will improve  and Ability to identify triggers associated with substance abuse/mental health issues will improve  Physician Treatment Plan for Secondary Diagnosis: Active Problems:   Bipolar disorder ([place])  Long Term Goal(s): Improvement in symptoms so as ready for discharge  Short Term Goals: Ability to identify changes in lifestyle to reduce recurrence of condition will improve, Ability to verbalize feelings will improve, Ability to disclose and discuss suicidal ideas, Ability to demonstrate self-control will improve, Ability to identify and develop effective coping behaviors will improve, Ability to maintain clinical measurements within normal limits will improve, Compliance with prescribed medications will improve and Ability to identify triggers associated with substance abuse/mental health issues will improve  I certify that inpatient services furnished can reasonably be expected to improve the patient's condition.    Sharma Covert, MD 3/20/201810:18 AM

## 2016-07-31 NOTE — Progress Notes (Signed)
Nursing Progress Note 7p-7a  D) Patient presents with flat affect and depressed mood. Patient recently admitted to the unit. Patient oriented to unit and provided graham crackers and ginger ale by Probation officer. Patient requested medication for sleep. Patient reports SI but contracts for safety on the unit. Patient denies HI/AVH and complains of generalized malaise/pain.   A) Emotional support given. 1:1 interaction and active listening provided. Patient medicated with orders as prescribed. Medications reviewed with patient. Patient verbalized understanding of medications without further questions. Opportunities for questions or concerns presented to patient. Labs, vital signs and patient behavior monitored throughout shift. Patient safety maintained with q15 min safety checks. Urine specimen cup provided and patient instructed to return to staff as soon as possible.  R) Patient receptive to interaction with nurse. Patient remains safe on the unit at this time. Patient is resting in bed without complaints. Will continue to monitor.

## 2016-07-31 NOTE — Progress Notes (Signed)
D: Patient is detoxing from heroin and having severe withdrawal symptoms.  Patient did ask for breakfast this morning and she was given food and a pitcher of gatorade.  Patient is feeling poorly and is not ambulating well.  She is a new admit to the unit and is a few day's postpartum.  Her baby is currently in the NICU.  Patient went out for a chest xray to rule out pneumonia.  She has a hacking cough and overall generalized malaise.  Patient's BP was elevated this am and MD notified; patient was placed on the clonidine protocol.  Patient reports passive SI. She had an episode of bradycardia in the ED. A: Push fluids and nutrition.  Monitor medication management and MD orders.  Safety checks completed every 15 minutes per protocol.  Offer support and encouragement as needed. Monitor blood pressure and pulse per clonidine protocol.  R: Patient remains isolative and withdrawn.

## 2016-07-31 NOTE — Tx Team (Signed)
Initial Treatment Plan 07/31/2016 3:16 AM [name] JPE:[id number]    PATIENT STRESSORS: Medication change or noncompliance Substance abuse   PATIENT STRENGTHS: Ability for insight Average or above average intelligence Capable of independent living General fund of knowledge Motivation for treatment/growth   PATIENT IDENTIFIED PROBLEMS: Depression Suicidal thoughts Substance abuse "I need detox and to go to a treatment facility"                     DISCHARGE CRITERIA:  Ability to meet basic life and health needs Improved stabilization in mood, thinking, and/or behavior Verbal commitment to aftercare and medication compliance Withdrawal symptoms are absent or subacute and managed without 24-hour nursing intervention  PRELIMINARY DISCHARGE PLAN: Attend aftercare/continuing care group Placement in alternative living arrangements  PATIENT/FAMILY INVOLVEMENT: This treatment plan has been presented to and reviewed with the patient, Kelly [name], and/or family member, .  The patient and family have been given the opportunity to ask questions and make suggestions.  [place], [place], [place] 07/31/2016, 3:16 AM

## 2016-07-31 NOTE — Tx Team (Signed)
Interdisciplinary Treatment and Diagnostic Plan Update  07/31/2016 Time of Session: [place] MRN: [id number]  Principal Diagnosis: Opiate Use Disorder  Secondary Diagnoses: Active Problems:   Bipolar disorder ([place])   Current Medications:  Current Facility-Administered Medications  Medication Dose Route Frequency Provider Last Rate Last Dose  . acetaminophen (TYLENOL) tablet 650 mg  650 mg Oral Q4H PRN Shuvon B Rankin, NP   650 mg at 07/31/16 0644  . buPROPion (WELLBUTRIN XL) 24 hr tablet 150 mg  150 mg Oral Daily Shuvon B Rankin, NP   150 mg at 07/31/16 0801  . cloNIDine (CATAPRES) tablet 0.1 mg  0.1 mg Oral QID Sharma Covert, MD       Followed by  . [START ON 08/02/2016] cloNIDine (CATAPRES) tablet 0.1 mg  0.1 mg Oral BH-qamhs Sharma Covert, MD       Followed by  . [START ON 08/04/2016] cloNIDine (CATAPRES) tablet 0.1 mg  0.1 mg Oral QAC breakfast Sharma Covert, MD      . dicyclomine (BENTYL) tablet 20 mg  20 mg Oral Q6H PRN Shuvon B Rankin, NP      . gabapentin (NEURONTIN) capsule 200 mg  200 mg Oral TID Shuvon B Rankin, NP   200 mg at 07/31/16 0801  . hydrOXYzine (ATARAX/VISTARIL) tablet 25 mg  25 mg Oral Q6H PRN Shuvon B Rankin, NP   25 mg at 07/31/16 0229  . loperamide (IMODIUM) capsule 2-4 mg  2-4 mg Oral PRN Shuvon B Rankin, NP      . methocarbamol (ROBAXIN) tablet 500 mg  500 mg Oral Q8H PRN Shuvon B Rankin, NP      . naproxen (NAPROSYN) tablet 500 mg  500 mg Oral BID PRN Sharma Covert, MD      . nicotine (NICODERM CQ - dosed in mg/24 hours) patch 21 mg  21 mg Transdermal Daily Jenne Campus, MD   21 mg at 07/31/16 0801  . ondansetron (ZOFRAN-ODT) disintegrating tablet 4 mg  4 mg Oral Q6H PRN Shuvon B Rankin, NP       PTA Medications: No prescriptions prior to admission.    Patient Stressors: Medication change or noncompliance Substance abuse  Patient Strengths: Ability for insight Average or above average intelligence Capable of  independent living General fund of knowledge Motivation for treatment/growth  Treatment Modalities: Medication Management, Group therapy, Case management,  1 to 1 session with clinician, Psychoeducation, Recreational therapy.   Physician Treatment Plan for Primary Diagnosis: Opiate Use Disorder Long Term Goal(s):     Short Term Goals:    Medication Management: Evaluate patient's response, side effects, and tolerance of medication regimen.  Therapeutic Interventions: 1 to 1 sessions, Unit Group sessions and Medication administration.  Evaluation of Outcomes: Progressing  Physician Treatment Plan for Secondary Diagnosis: Active Problems:   Bipolar disorder ([place])  Long Term Goal(s):     Short Term Goals:       Medication Management: Evaluate patient's response, side effects, and tolerance of medication regimen.  Therapeutic Interventions: 1 to 1 sessions, Unit Group sessions and Medication administration.  Evaluation of Outcomes: Progressing   RN Treatment Plan for Primary Diagnosis: Opiate Use Disorder Long Term Goal(s): Knowledge of disease and therapeutic regimen to maintain health will improve  Short Term Goals: Ability to remain free from injury will improve, Ability to disclose and discuss suicidal ideas and Ability to identify and develop effective coping behaviors will improve  Medication Management: RN will administer medications as ordered by provider,  will assess and evaluate patient's response and provide education to patient for prescribed medication. RN will report any adverse and/or side effects to prescribing provider.  Therapeutic Interventions: 1 on 1 counseling sessions, Psychoeducation, Medication administration, Evaluate responses to treatment, Monitor vital signs and CBGs as ordered, Perform/monitor CIWA, COWS, AIMS and Fall Risk screenings as ordered, Perform wound care treatments as ordered.  Evaluation of Outcomes: Progressing   LCSW Treatment Plan  for Primary Diagnosis:Opiate Use Disorder Long Term Goal(s): Safe transition to appropriate next level of care at discharge, Engage patient in therapeutic group addressing interpersonal concerns.  Short Term Goals: Engage patient in aftercare planning with referrals and resources, Facilitate patient progression through stages of change regarding substance use diagnoses and concerns and Identify triggers associated with mental health/substance abuse issues  Therapeutic Interventions: Assess for all discharge needs, 1 to 1 time with Social worker, Explore available resources and support systems, Assess for adequacy in community support network, Educate family and significant other(s) on suicide prevention, Complete Psychosocial Assessment, Interpersonal group therapy.  Evaluation of Outcomes: Progressing   Progress in Treatment: Attending groups: Yes. Participating in groups: Yes. Taking medication as prescribed: Yes. Toleration medication: Yes. Family/Significant other contact made: No, will contact:  family member if patient consents Patient understands diagnosis: Yes. Discussing patient identified problems/goals with staff: Yes. Medical problems stabilized or resolved: Yes. Denies suicidal/homicidal ideation: Yes. Issues/concerns per patient self-inventory: No. Other: n/a  New problem(s) identified: No, Describe:  n/a  New Short Term/Long Term Goal(s): detox; medication stabilization; development of comprehensive mental wellness/sobriety plan.   Discharge Plan or Barriers: CSW assessing for appropriate referrals. During 2015 admission; [facility]).  Reason for Continuation of Hospitalization: Anxiety Depression Medication stabilization Withdrawal symptoms  Estimated Length of Stay: 3-5 days   Attendees: Patient: 07/31/2016 9:03 AM  Physician: Dr. Mallie Darting MD 07/31/2016 9:03 AM  Nursing: Rise Paganini RN; [place] RN  07/31/2016 9:03 AM  RN Care Manager: Lars Pinks CM 07/31/2016 9:03 AM  Social Worker: Press photographer, LCSW; Matthew Saras LCSWA 07/31/2016 9:03 AM  Recreational Therapist: Rhunette Croft 07/31/2016 9:03 AM  Other: May Augustin NP 07/31/2016 9:03 AM  Other:  07/31/2016 9:03 AM  Other: 07/31/2016 9:03 AM    Scribe for Treatment Team: [place], LCSW 07/31/2016 9:03 AM

## 2016-08-01 MED ORDER — HYDROXYZINE HCL 50 MG PO TABS
50.0000 mg | ORAL_TABLET | Freq: Four times a day (QID) | ORAL | Status: DC | PRN
  Administered 2016-08-01 – 2016-08-02 (×3): 50 mg via ORAL
  Filled 2016-08-01 (×3): qty 1

## 2016-08-01 MED ORDER — GABAPENTIN 300 MG PO CAPS
300.0000 mg | ORAL_CAPSULE | Freq: Three times a day (TID) | ORAL | Status: DC
  Administered 2016-08-01 – 2016-08-03 (×6): 300 mg via ORAL
  Filled 2016-08-01 (×9): qty 1

## 2016-08-01 NOTE — Progress Notes (Signed)
CSW spoke with pt briefly this morning, as pt was drowsy and not willing to talk at length. Pt agreeable to allowing [place] meet with her this afternoon. CSW spoke with pt's CPS worker Herbie Baltimore to confirm.  Maxie Better, MSW, LCSW Clinical Social Worker 08/01/2016 11:26 AM

## 2016-08-01 NOTE — Progress Notes (Signed)
Patient ID: Kelly [name], female   DOB: [birth year], 31 y.o.   MRN: [id number]   Pt currently presents with a flat affect and depressed behavior. Pt reports to writer that their goal is to "get sober." Pt states tearfully "I lost my child, I am worthless." Reports current cravings 10/10. Pt reports good sleep with current medication regimen. Pt reports that she is experiencing abdominal cramping and still expelling lochia with clotting. Exhibiting signs and symptoms of withdrawal including fatigue, generalized aches, anxiety and intermittent nausea.   Pt provided with medications per providers orders. Pt's labs and vitals were monitored throughout the night. Pt given a 1:1 about emotional and mental status. Pt supported and encouraged to express concerns and questions. Pt educated on medications and substance abuse withdrawal.   Pt's safety ensured with 15 minute and environmental checks. Pt currently denies SI/HI and A/V hallucinations. Pt verbally agrees to seek staff if SI/HI or A/VH occurs and to consult with staff before acting on any harmful thoughts. Pt verbalized that she will notify nurse is bleeding or cramping get worse tonight. Will continue POC.

## 2016-08-01 NOTE — Progress Notes (Signed)
[facility] MD Progress Note  08/01/2016 9:44 AM Kelly [name]  MRN:  [id number] Subjective:  Patient is a 31 YO female Principal Problem: opiate dependence Diagnosis:   Patient Active Problem List   Diagnosis Date Noted  . Bipolar disorder ([place]) [F31.9] 07/31/2016  . Major depressive disorder, recurrent episode with anxious distress ([place]) [F33.9] 07/29/2016  . Normal labor [O80, Z37.9] 07/26/2016  . Foreign body granuloma of soft tissue, NEC, right upper arm [M60.221]   . Pyelonephritis [N12] 05/28/2016  . Polysubstance abuse [F19.10] 05/25/2016  . No prenatal care in current pregnancy [O09.30] 05/25/2016  . Chronic hypertension affecting pregnancy [O10.919] 05/25/2016  . History of cesarean delivery affecting pregnancy [O34.219] 05/25/2016  . Community acquired pneumonia [J18.9] 05/25/2016  . Pyelonephritis affecting pregnancy [O23.00] 05/24/2016  . Palliative care encounter [Z51.5]   . Abscess of right breast [N61.1] 07/13/2014  . Opioid use disorder, severe, dependence ([place]) [F11.20] 08/25/2013  . Unspecified episodic mood disorder [F39] 08/25/2013  . ADHD (attention deficit hyperactivity disorder) [F90.9] 08/25/2013  . Cocaine abuse [F14.10] 08/25/2013  . Thoracic back pain [M54.6] 01/29/2013  . CROHN'S DISEASE [K50.90] 05/03/2005  . COLONIC POLYPS [D12.6] 02/28/2003   Total Time spent with patient: 20 minutes  Past Psychiatric History: see H&P  Past Medical History:  Past Medical History:  Diagnosis Date  . Abscess of breast    rt breast  . ADD (attention deficit disorder)   . Back pain   . Bipolar 1 disorder ([place])   . Hypertension   . Polysubstance abuse   . Suicidal overdose [facility])     Past Surgical History:  Procedure Laterality Date  . BACK SURGERY     scoliosis-in care everywhere 08/1999  . CESAREAN SECTION    . CHOLECYSTECTOMY  March 2015  . FOREIGN BODY REMOVAL Right 05/28/2016   Procedure: REMOVAL FOREIGN BODY RIGHT ARM;  Surgeon: Meredith Pel, MD;   Location: [place];  Service: Orthopedics;  Laterality: Right;  . IRRIGATION AND DEBRIDEMENT ABSCESS Right 07/13/2014   Procedure: IRRIGATION AND DEBRIDEMEN TRIGHT BREAST ABSCESS;  Surgeon: Donnie Mesa, MD;  Location: [place];  Service: General;  Laterality: Right;   Family History:  Family History  Problem Relation Age of Onset  . Diabetes Mother   . Diabetes Father   . Hypertension Father   . Heart attack Neg Hx   . Hyperlipidemia Neg Hx   . Sudden death Neg Hx    Family Psychiatric  History: see H&P Social History:  History  Alcohol Use  . Yes    Comment: one or two drinks weekly     History  Drug Use  . Types: Cocaine, Heroin, IV    Comment: heroin, crack cocaine    Social History   Social History  . Marital status: Single    Spouse name: N/A  . Number of children: N/A  . Years of education: N/A   Social History Main Topics  . Smoking status: Current Every Day Smoker    Packs/day: 1.00    Years: 13.00    Types: Cigarettes  . Smokeless tobacco: Never Used  . Alcohol use Yes     Comment: one or two drinks weekly  . Drug use: Yes    Types: Cocaine, Heroin, IV     Comment: heroin, crack cocaine  . Sexual activity: Yes    Birth control/ protection: Condom   Other Topics Concern  . None   Social History Narrative  . None   Additional Social History:  Sleep: Fair  Appetite:  Fair  Current Medications: Current Facility-Administered Medications  Medication Dose Route Frequency Provider Last Rate Last Dose  . acetaminophen (TYLENOL) tablet 650 mg  650 mg Oral Q4H PRN Shuvon B Rankin, NP   650 mg at 08/01/16 0343  . buPROPion (WELLBUTRIN XL) 24 hr tablet 150 mg  150 mg Oral Daily Shuvon B Rankin, NP   150 mg at 08/01/16 0836  . cloNIDine (CATAPRES) tablet 0.1 mg  0.1 mg Oral QID Sharma Covert, MD   0.1 mg at 08/01/16 0836   Followed by  . [START ON 08/02/2016] cloNIDine (CATAPRES) tablet 0.1 mg  0.1 mg Oral BH-qamhs Sharma Covert, MD       Followed by  . [START ON 08/04/2016] cloNIDine (CATAPRES) tablet 0.1 mg  0.1 mg Oral QAC breakfast Sharma Covert, MD      . dicyclomine (BENTYL) tablet 20 mg  20 mg Oral Q6H PRN Shuvon B Rankin, NP   20 mg at 08/01/16 5366  . gabapentin (NEURONTIN) capsule 300 mg  300 mg Oral TID Sharma Covert, MD      . hydrOXYzine (ATARAX/VISTARIL) tablet 50 mg  50 mg Oral Q6H PRN Sharma Covert, MD      . loperamide (IMODIUM) capsule 2-4 mg  2-4 mg Oral PRN Shuvon B Rankin, NP      . methocarbamol (ROBAXIN) tablet 500 mg  500 mg Oral Q8H PRN Shuvon B Rankin, NP   500 mg at 07/31/16 0917  . naproxen (NAPROSYN) tablet 500 mg  500 mg Oral BID PRN Sharma Covert, MD   500 mg at 08/01/16 0843  . nicotine (NICODERM CQ - dosed in mg/24 hours) patch 21 mg  21 mg Transdermal Daily Jenne Campus, MD   21 mg at 08/01/16 0836  . ondansetron (ZOFRAN-ODT) disintegrating tablet 4 mg  4 mg Oral Q6H PRN Shuvon B Rankin, NP   4 mg at 07/31/16 4403    Lab Results:  Results for orders placed or performed during the hospital encounter of 07/31/16 (from the past 48 hour(s))  Urinalysis, Routine w reflex microscopic     Status: Abnormal   Collection Time: 07/31/16  2:20 AM  Result Value Ref Range   Color, Urine YELLOW YELLOW   APPearance CLEAR CLEAR   Specific Gravity, Urine 1.017 1.005 - 1.030   pH 7.0 5.0 - 8.0   Glucose, UA NEGATIVE NEGATIVE mg/dL   Hgb urine dipstick LARGE (A) NEGATIVE   Bilirubin Urine NEGATIVE NEGATIVE   Ketones, ur NEGATIVE NEGATIVE mg/dL   Protein, ur 100 (A) NEGATIVE mg/dL   Nitrite NEGATIVE NEGATIVE   Leukocytes, UA SMALL (A) NEGATIVE   RBC / HPF TOO NUMEROUS TO COUNT 0 - 5 RBC/hpf   WBC, UA TOO NUMEROUS TO COUNT 0 - 5 WBC/hpf   Bacteria, UA RARE (A) NONE SEEN   Squamous Epithelial / LPF 0-5 (A) NONE SEEN   Mucous PRESENT    Non Squamous Epithelial 0-5 (A) NONE SEEN    Comment: Performed at [facility], [place] [address]  [place], [place] [address]    Blood Alcohol level:  Lab Results  Component Value Date   [facility] <5 07/28/2016   ETH <11 95/63/8756    Metabolic Disorder Labs: Lab Results  Component Value Date   HGBA1C 4.9 05/24/2016   MPG 94 05/24/2016   No results found for: PROLACTIN No results found for: CHOL, TRIG, HDL, CHOLHDL, VLDL, LDLCALC  Physical  Findings: AIMS: Facial and Oral Movements Muscles of Facial Expression: None, normal Lips and Perioral Area: None, normal Jaw: None, normal Tongue: None, normal,Extremity Movements Upper (arms, wrists, hands, fingers): None, normal Lower (legs, knees, ankles, toes): None, normal, Trunk Movements Neck, shoulders, hips: None, normal, Overall Severity Severity of abnormal movements (highest score from questions above): None, normal Incapacitation due to abnormal movements: None, normal Patient's awareness of abnormal movements (rate only patient's report): No Awareness, Dental Status Current problems with teeth and/or dentures?: No Does patient usually wear dentures?: No  CIWA:    COWS:  COWS Total Score: 6  Musculoskeletal: Strength & Muscle Tone: within normal limits Gait & Station: normal Patient leans: N/A  Psychiatric Specialty Exam: Physical Exam  ROS  Blood pressure (!) 169/89, pulse (!) 57, temperature 97.8 F (36.6 C), temperature source Oral, resp. rate 18, height 5' 7"  (1.702 m), weight 78.9 kg (174 lb).Body mass index is 27.25 kg/m.  General Appearance: Disheveled  Eye Contact:  Poor  Speech:  Slow  Volume:  Decreased  Mood:  Depressed  Affect:  Congruent  Thought Process:  Coherent  Orientation:  Full (Time, Place, and Person)  Thought Content:  Logical  Suicidal Thoughts:  No  Homicidal Thoughts:  No  Memory:  Immediate;   Fair  Judgement:  Impaired  Insight:  Lacking  Psychomotor Activity:  Restlessness  Concentration:  Concentration: Fair  Recall:  [employer] of Knowledge:  Fair  Language:  Fair  Akathisia:  No   Handed:  Right  AIMS (if indicated):     Assets:  Resilience  ADL's:  Intact  Cognition:  WNL  Sleep:  Number of Hours: 6     Treatment Plan Summary: Daily contact with patient to assess and evaluate symptoms and progress in treatment, Medication management and Plan Patient is seen and examined. patient is feeling better physically this am but still feels bad. She is sad and tearful about her newborn. It sounds like the process is in place for the chiold to be adopted. She is upset about this. Still with limited insight to the entire situation. She was talking about leaving but i underscored the fact that things would get worse if she left now, and the addiction issues would just continue. No change in meds.SW continues to work on aftercare plans  Sharma Covert, MD 08/01/2016, 9:44 AM

## 2016-08-01 NOTE — Progress Notes (Signed)
[name] [facility]) visited with pt this afternoon to discuss adoption and process with patient. Patient irritable and drowsy when CSW woke her to meet with adoption agency but agreed to meet with her. CSW has been in contact with CPS worker Marry Guan today and left message with pt's mother Tyleah Loh) to provide update on patient progress and inform of above.  Maxie Better, MSW, LCSW Clinical Social Worker 08/01/2016 3:46 PM

## 2016-08-01 NOTE — Progress Notes (Signed)
D: Maelynn denied SI, HI, and AVH today. She was appropriate, flat, and cooperative on the unit today. She has complained of feeling unwell due to detox and recovery from childbirth. She has reported passing some clots. Pt rated her depression and anxiety 10/10. She reported fair sleep, poor appetite, low energy level, and poor concentration.   A: Meds given as ordered. Q15 safety checks maintained. Support/encouragement offered.  R: Pt remains free from harm and continues with treatment. Will continue to monitor for needs/safety.

## 2016-08-01 NOTE — Progress Notes (Signed)
Recreation Therapy Notes  Date: 08/01/16 Time: 0930 Location: 300 Hall Dayroom  Group Topic: Stress Management  Goal Area(s) Addresses:  Patient will verbalize importance of using healthy stress management.  Patient will identify positive emotions associated with healthy stress management.   Intervention: Stress Management  Activity :  Peaceful Place.  LRT introduced the stress management technique of guided imagery.  LRT read a script to guide patients through the technique.  Patients were to follow along as LRT read script to participate in the technique.  Education:  Stress Management, Discharge Planning.   Education Outcome: Acknowledges edcuation/In group clarification offered/Needs additional education  Clinical Observations/Feedback: Pt did not attend group.   Victorino Sparrow, LRT/CTRS         Victorino Sparrow A 08/01/2016 12:40 PM

## 2016-08-01 NOTE — Progress Notes (Signed)
D: Pt denies SI/HI/AVH. Pt is reclusive to room. Continues to detox from heroin. A: Pt was offered support and encouragement. Pt was given scheduled medications. Pt was encourage to attend groups. Q 15 minute checks were done for safety.  R: Pt is taking medication. Pt receptive to treatment and safety maintained on unit.

## 2016-08-01 NOTE — Progress Notes (Signed)
Pt attended the evening NA speaker meeting. Pt was engaged and appropriate.  Clint Bolder, [place] 08/01/16 8:38 PM

## 2016-08-01 NOTE — Progress Notes (Addendum)
Spiritual care following due to recent delivery [facility].    Alerted to case from chaplains at [facility] hospital who are continuing to follow and provide support for baby in NICU.  [facility] hospital chaplain [name] reports that Kelly [name] had made arrangements for adoption of baby.  Kelly [name]'s mother was not able / willing to take custody of child, as she cares for another child (38 or 60 years old) currently.  Women's chaplains are not aware of CPS report with family.  If there is no CPS involvement, Kelly [name] is still able to have contact with baby if this is desired / helpful.   Note [facility] SW note that refers to CPS case worker.  Will clarify extent of contact Kelly [name] can have with baby.    This chaplain will provide support for Kelly [name] while at [name].  Will consult with women's hospital SW and Chaplains for interaction with baby, footprints, photos, or other memory making.

## 2016-08-01 NOTE — BHH Group Notes (Signed)
[place] LCSW Group Therapy  08/01/2016 2:39 PM  Type of Therapy:  Group Therapy  Participation Level:  Did Not Attend-pt invited. Chose to remain in bed.   Summary of Progress/Problems: Emotion Regulation: This group focused on both positive and negative emotion identification and allowed group members to process ways to identify feelings, regulate negative emotions, and find healthy ways to manage internal/external emotions. Group members were asked to reflect on a time when their reaction to an emotion led to a negative outcome and explored how alternative responses using emotion regulation would have benefited them. Group members were also asked to discuss a time when emotion regulation was utilized when a negative emotion was experienced.   Kelly [name] N Smart  LCSW 08/01/2016, 2:39 PM

## 2016-08-01 NOTE — Progress Notes (Signed)
D: Patient continues to complain with head pain. Patient states she fell in emergency room prior to coming to unit.  A:Patient alert, oriented, PERLA, grips equal. Patient administered tylenol 650 mg for pain. Frederico Hamman PA made aware of patient complaint. No new orders received. Patient instructed to talk with provider this am if symptoms does not improve or worsens.  R: Patient returned to bed to rest. No further concerns voiced.

## 2016-08-02 MED ORDER — TRAZODONE HCL 50 MG PO TABS
50.0000 mg | ORAL_TABLET | Freq: Every evening | ORAL | Status: DC | PRN
  Administered 2016-08-02: 50 mg via ORAL
  Filled 2016-08-02 (×2): qty 1

## 2016-08-02 NOTE — BHH Group Notes (Signed)
[place] LCSW Group Therapy  08/02/2016 3:23 PM  Type of Therapy:  Group Therapy  Participation Level:  Minimal  Participation Quality:  Attentive  Affect:  Appropriate  Cognitive:  Oriented  Insight:  Improving  Engagement in Therapy:  Improving  Modes of Intervention:  Discussion, Education, Exploration, Socialization and Support  Summary of Progress/Problems: [facility] Speaker came to talk about his personal journey with substance abuse and addiction. The pt processed ways by which to relate to the speaker. [place] speaker provided handouts and educational information pertaining to groups and services offered by the [facility].   Teddy Pena N Smart LCSW 08/02/2016, 3:23 PM

## 2016-08-02 NOTE — Progress Notes (Signed)
[facility] MD Progress Note  08/02/2016 9:53 AM Kelly [name]  MRN:  [id number] Subjective:  Patient is a 31 YO female Principal Problem: opiate dependence Diagnosis:   Patient Active Problem List   Diagnosis Date Noted  . Bipolar disorder ([place]) [F31.9] 07/31/2016  . Major depressive disorder, recurrent episode with anxious distress ([place]) [F33.9] 07/29/2016  . Normal labor [O80, Z37.9] 07/26/2016  . Foreign body granuloma of soft tissue, NEC, right upper arm [M60.221]   . Pyelonephritis [N12] 05/28/2016  . Polysubstance abuse [F19.10] 05/25/2016  . No prenatal care in current pregnancy [O09.30] 05/25/2016  . Chronic hypertension affecting pregnancy [O10.919] 05/25/2016  . History of cesarean delivery affecting pregnancy [O34.219] 05/25/2016  . Community acquired pneumonia [J18.9] 05/25/2016  . Pyelonephritis affecting pregnancy [O23.00] 05/24/2016  . Palliative care encounter [Z51.5]   . Abscess of right breast [N61.1] 07/13/2014  . Opioid use disorder, severe, dependence ([place]) [F11.20] 08/25/2013  . Unspecified episodic mood disorder [F39] 08/25/2013  . ADHD (attention deficit hyperactivity disorder) [F90.9] 08/25/2013  . Cocaine abuse [F14.10] 08/25/2013  . Thoracic back pain [M54.6] 01/29/2013  . CROHN'S DISEASE [K50.90] 05/03/2005  . COLONIC POLYPS [D12.6] 02/28/2003   Total Time spent with patient: 20 minutes  Past Psychiatric History: see H&P  Past Medical History:  Past Medical History:  Diagnosis Date  . Abscess of breast    rt breast  . ADD (attention deficit disorder)   . Back pain   . Bipolar 1 disorder ([place])   . Hypertension   . Polysubstance abuse   . Suicidal overdose [facility])     Past Surgical History:  Procedure Laterality Date  . BACK SURGERY     scoliosis-in care everywhere 08/1999  . CESAREAN SECTION    . CHOLECYSTECTOMY  March 2015  . FOREIGN BODY REMOVAL Right 05/28/2016   Procedure: REMOVAL FOREIGN BODY RIGHT ARM;  Surgeon: Meredith Pel, MD;   Location: [place];  Service: Orthopedics;  Laterality: Right;  . IRRIGATION AND DEBRIDEMENT ABSCESS Right 07/13/2014   Procedure: IRRIGATION AND DEBRIDEMEN TRIGHT BREAST ABSCESS;  Surgeon: Donnie Mesa, MD;  Location: [place];  Service: General;  Laterality: Right;   Family History:  Family History  Problem Relation Age of Onset  . Diabetes Mother   . Diabetes Father   . Hypertension Father   . Heart attack Neg Hx   . Hyperlipidemia Neg Hx   . Sudden death Neg Hx    Family Psychiatric  History: see H&P Social History:  History  Alcohol Use  . Yes    Comment: one or two drinks weekly     History  Drug Use  . Types: Cocaine, Heroin, IV    Comment: heroin, crack cocaine    Social History   Social History  . Marital status: Single    Spouse name: N/A  . Number of children: N/A  . Years of education: N/A   Social History Main Topics  . Smoking status: Current Every Day Smoker    Packs/day: 1.00    Years: 13.00    Types: Cigarettes  . Smokeless tobacco: Never Used  . Alcohol use Yes     Comment: one or two drinks weekly  . Drug use: Yes    Types: Cocaine, Heroin, IV     Comment: heroin, crack cocaine  . Sexual activity: Yes    Birth control/ protection: Condom   Other Topics Concern  . None   Social History Narrative  . None   Additional Social History:  Sleep: Fair  Appetite:  Fair  Current Medications: Current Facility-Administered Medications  Medication Dose Route Frequency Provider Last Rate Last Dose  . acetaminophen (TYLENOL) tablet 650 mg  650 mg Oral Q4H PRN Shuvon B Rankin, NP   650 mg at 08/02/16 0308  . buPROPion (WELLBUTRIN XL) 24 hr tablet 150 mg  150 mg Oral Daily Shuvon B Rankin, NP   150 mg at 08/02/16 4098  . cloNIDine (CATAPRES) tablet 0.1 mg  0.1 mg Oral BH-qamhs Sharma Covert, MD   0.1 mg at 08/02/16 1191   Followed by  . [START ON 08/04/2016] cloNIDine (CATAPRES) tablet 0.1 mg  0.1 mg Oral QAC breakfast  Sharma Covert, MD      . dicyclomine (BENTYL) tablet 20 mg  20 mg Oral Q6H PRN Shuvon B Rankin, NP   20 mg at 08/01/16 2131  . gabapentin (NEURONTIN) capsule 300 mg  300 mg Oral TID Sharma Covert, MD   300 mg at 08/02/16 4782  . hydrOXYzine (ATARAX/VISTARIL) tablet 50 mg  50 mg Oral Q6H PRN Sharma Covert, MD   50 mg at 08/02/16 0433  . loperamide (IMODIUM) capsule 2-4 mg  2-4 mg Oral PRN Shuvon B Rankin, NP      . methocarbamol (ROBAXIN) tablet 500 mg  500 mg Oral Q8H PRN Shuvon B Rankin, NP   500 mg at 07/31/16 0917  . naproxen (NAPROSYN) tablet 500 mg  500 mg Oral BID PRN Sharma Covert, MD   500 mg at 08/01/16 1652  . nicotine (NICODERM CQ - dosed in mg/24 hours) patch 21 mg  21 mg Transdermal Daily Jenne Campus, MD   21 mg at 08/02/16 9562  . ondansetron (ZOFRAN-ODT) disintegrating tablet 4 mg  4 mg Oral Q6H PRN Shuvon B Rankin, NP   4 mg at 08/01/16 1840  . traZODone (DESYREL) tablet 50 mg  50 mg Oral QHS PRN Sharma Covert, MD        Lab Results:  No results found for this or any previous visit (from the past 48 hour(s)).  Blood Alcohol level:  Lab Results  Component Value Date   ETH <5 07/28/2016   ETH <11 13/12/6576    Metabolic Disorder Labs: Lab Results  Component Value Date   HGBA1C 4.9 05/24/2016   MPG 94 05/24/2016   No results found for: PROLACTIN No results found for: CHOL, TRIG, HDL, CHOLHDL, VLDL, LDLCALC  Physical Findings: AIMS: Facial and Oral Movements Muscles of Facial Expression: None, normal Lips and Perioral Area: None, normal Jaw: None, normal Tongue: None, normal,Extremity Movements Upper (arms, wrists, hands, fingers): None, normal Lower (legs, knees, ankles, toes): None, normal, Trunk Movements Neck, shoulders, hips: None, normal, Overall Severity Severity of abnormal movements (highest score from questions above): None, normal Incapacitation due to abnormal movements: None, normal Patient's awareness of abnormal movements  (rate only patient's report): No Awareness, Dental Status Current problems with teeth and/or dentures?: No Does patient usually wear dentures?: No  CIWA:    COWS:  COWS Total Score: 7  Musculoskeletal: Strength & Muscle Tone: within normal limits Gait & Station: normal Patient leans: N/A  Psychiatric Specialty Exam: Physical Exam  Nursing note and vitals reviewed.   ROS  Blood pressure (!) 155/96, pulse 61, temperature 99.3 F (37.4 C), temperature source Oral, resp. rate 18, height 5' 7"  (1.702 m), weight 78.9 kg (174 lb).Body mass index is 27.25 kg/m.  General Appearance: Disheveled  Eye Contact:  Fair  Speech:  Normal Rate and Slow  Volume:  Decreased  Mood:  Depressed  Affect:  Congruent  Thought Process:  Coherent  Orientation:  Full (Time, Place, and Person)  Thought Content:  Logical  Suicidal Thoughts:  No  Homicidal Thoughts:  No  Memory:  Immediate;   Fair  Judgement:  Intact  Insight:  Fair and Lacking  Psychomotor Activity:  Increased and Restlessness  Concentration:  Concentration: Fair  Recall:  [employer] of Knowledge:  Fair  Language:  Fair  Akathisia:  No  Handed:  Right  AIMS (if indicated):     Assets:  Resilience  ADL's:  Intact  Cognition:  WNL  Sleep:  Number of Hours: 6     Treatment Plan Summary: Patient is seen and examined. Physically she is improving from a withdrawal standpoint. Emotionally she is still sad about the baby going up for adoption. She feels as though the adoption is the best bet and she is unsure whether she will ever get to have the ability to see the child in the future. Sleep is improving. We discussed the possibility of suboxone after she is discharged from the hospital. Probable discharge tomorrow.  Sharma Covert, MD 08/02/2016, 9:53 AMPatient ID: Kelly [name], female   DOB: 1985-10-09, 31 y.o.   MRN: [id number]

## 2016-08-02 NOTE — Progress Notes (Addendum)
Patient ID: [name], female   DOB: Aug 11, 1985, 31 y.o.   MRN: [id number]  Pt currently presents with a flat, sad affect and cooperative behavior. Pt reports to Probation officer that their goal is to "go to a treatment facility tomorrow." Pt reports to writer that she got news that her newborn son health is progressing at the NICU. Pt states "my mom is coming to see me in the morning, she is upset with me." Pt reports good sleep with current medication regimen. Pt reports pain and edema in her breasts bilaterally. Pt reports that her cravings are "through the roof."  Pt provided with medications per providers orders. Pt's labs and vitals were monitored throughout the night. Pt given a 1:1 about emotional and mental status. Pt supported and encouraged to express concerns and questions. Pt educated on medications including Subuxone and substance abuse withdrawal. Pt given cold cabbage leaves for breast discomfort, tolerated well.   Pt's safety ensured with 15 minute and environmental checks. Pt currently denies SI/HI and A/V hallucinations. Pt verbally agrees to seek staff if SI/HI or A/VH occurs and to consult with staff before acting on any harmful thoughts. Pt reports optimism in recovery at the [facility] clinic she plans to attend. Will continue POC.

## 2016-08-02 NOTE — Progress Notes (Signed)
Edison Simon ([employer]), pt's mother, Dr. Mallie Darting, and CSW have meeting scheduled tomorrow (Friday 3/23) at 8:30AM for pt to sign paperwork and for MD to confirm pt's competency.  Maxie Better, MSW, LCSW Clinical Social Worker 08/02/2016 8:43 AM

## 2016-08-02 NOTE — Progress Notes (Signed)
D: Patient has been in bed this morning. She continues to complain of headaches.  Patient's presentation has improved since admission.  She reports withdrawal symptoms as diarrhea, cravings, cramping and irritability.  She rates her depression and hopelessness as a 7; anxiety as an 8.  She denies any thoughts of self harm.  Patient plans to meet with her mother tomorrow to sign papers regarding care of her infant.  Patient has not seen her mother is 2 years and wants her to be here.  She is a possible discharge tomorrow.   A: Continue to monitor medication management and MD orders.  Safety checks completed every 15 minutes per protocol.  Offer support and encouragement as needed. R: Patient remains isolative and withdrawn.

## 2016-08-03 MED ORDER — HYDROXYZINE HCL 50 MG PO TABS: 50.0000 mg | ORAL_TABLET | Freq: Four times a day (QID) | ORAL | 0 refills | Status: DC | PRN

## 2016-08-03 MED ORDER — BUPROPION HCL ER (XL) 150 MG PO TB24: 150.0000 mg | ORAL_TABLET | Freq: Every day | ORAL | 0 refills | Status: DC

## 2016-08-03 MED ORDER — GABAPENTIN 300 MG PO CAPS: 300.0000 mg | ORAL_CAPSULE | Freq: Three times a day (TID) | ORAL | 0 refills | Status: DC

## 2016-08-03 MED ORDER — TRAZODONE HCL 50 MG PO TABS: 50.0000 mg | ORAL_TABLET | Freq: Every evening | ORAL | 0 refills | Status: DC | PRN

## 2016-08-03 MED ORDER — NICOTINE 21 MG/24HR TD PT24: 21.0000 mg | MEDICATED_PATCH | Freq: Every day | TRANSDERMAL | 0 refills | Status: DC

## 2016-08-03 NOTE — Progress Notes (Signed)
  [name] Adult Case Management Discharge Plan :  Will you be returning to the same living situation after discharge:  No.Pt accepted into [facility].  At discharge, do you have transportation home?: Yes,  Satira Sark ([facility] director) will pick her up at 10am this morning. Do you have the ability to pay for your medications: Yes, [place] medicaid.   Release of information consent forms completed and submitted to medical records by CSW.  Patient to Follow up at: Follow-up Information    [facility] Follow up on 08/03/2016.   Why:  You have been accepted into this program. Sherrill Raring will pick you up on Friday at 10:00AM from the hospital. Thank you.  Contact information: [address] [place], [place] [address] Phone: [phone] Fax: [phone]          Next level of care provider has access to [place] and Suicide Prevention discussed: Yes,  SPE completed with pt's mother and with pt. Family session prior to discharge as well.   Have you used any form of tobacco in the last 30 days? (Cigarettes, Smokeless Tobacco, Cigars, and/or Pipes): Yes  Has patient been referred to the Quitline?: Patient refused referral  Patient has been referred for addiction treatment: Yes  Epifania Littrell N Smart LCSW 08/03/2016, 8:22 AM

## 2016-08-03 NOTE — Progress Notes (Signed)
Patient to discharged ambulatory to counselor in  lobby. All discharge paperwork given and signed, valuables returned. Pt. Stated that she had a cell phone when she was in the ED in [place]; called to [facility] and unable to locate. ED note indicates that father took her valuables from the ED. Prescriptions given. Patient able to verbalize understanding. Patient stable, denies SI/HI/AVH. Patient given opportunity to express concerns and ask questions.

## 2016-08-03 NOTE — Tx Team (Signed)
Interdisciplinary Treatment and Diagnostic Plan Update  08/03/2016 Time of Session: [place] MRN: [id number]  Principal Diagnosis: Opiate Use Disorder  Secondary Diagnoses: Active Problems:   Bipolar disorder ([place])   Current Medications:  Current Facility-Administered Medications  Medication Dose Route Frequency Provider Last Rate Last Dose  . acetaminophen (TYLENOL) tablet 650 mg  650 mg Oral Q4H PRN Shuvon B Rankin, NP   650 mg at 08/03/16 0449  . buPROPion (WELLBUTRIN XL) 24 hr tablet 150 mg  150 mg Oral Daily Shuvon B Rankin, NP   150 mg at 08/03/16 0820  . cloNIDine (CATAPRES) tablet 0.1 mg  0.1 mg Oral BH-qamhs Sharma Covert, MD   0.1 mg at 08/03/16 0820   Followed by  . [START ON 08/04/2016] cloNIDine (CATAPRES) tablet 0.1 mg  0.1 mg Oral QAC breakfast Sharma Covert, MD      . dicyclomine (BENTYL) tablet 20 mg  20 mg Oral Q6H PRN Shuvon B Rankin, NP   20 mg at 08/02/16 2129  . gabapentin (NEURONTIN) capsule 300 mg  300 mg Oral TID Sharma Covert, MD   300 mg at 08/03/16 0819  . hydrOXYzine (ATARAX/VISTARIL) tablet 50 mg  50 mg Oral Q6H PRN Sharma Covert, MD   50 mg at 08/02/16 2128  . loperamide (IMODIUM) capsule 2-4 mg  2-4 mg Oral PRN Shuvon B Rankin, NP      . methocarbamol (ROBAXIN) tablet 500 mg  500 mg Oral Q8H PRN Shuvon B Rankin, NP   500 mg at 07/31/16 0917  . naproxen (NAPROSYN) tablet 500 mg  500 mg Oral BID PRN Sharma Covert, MD   500 mg at 08/02/16 2129  . nicotine (NICODERM CQ - dosed in mg/24 hours) patch 21 mg  21 mg Transdermal Daily Jenne Campus, MD   21 mg at 08/03/16 0820  . ondansetron (ZOFRAN-ODT) disintegrating tablet 4 mg  4 mg Oral Q6H PRN Shuvon B Rankin, NP   4 mg at 08/01/16 1840  . traZODone (DESYREL) tablet 50 mg  50 mg Oral QHS PRN Sharma Covert, MD   50 mg at 08/02/16 2129   PTA Medications: No prescriptions prior to admission.    Patient Stressors: Medication change or noncompliance Substance  abuse  Patient Strengths: Ability for insight Average or above average intelligence Capable of independent living General fund of knowledge Motivation for treatment/growth  Treatment Modalities: Medication Management, Group therapy, Case management,  1 to 1 session with clinician, Psychoeducation, Recreational therapy.   Physician Treatment Plan for Primary Diagnosis: Opiate Use Disorder Long Term Goal(s): Improvement in symptoms so as ready for discharge Improvement in symptoms so as ready for discharge   Short Term Goals: Ability to identify changes in lifestyle to reduce recurrence of condition will improve Ability to verbalize feelings will improve Ability to disclose and discuss suicidal ideas Ability to demonstrate self-control will improve Ability to identify and develop effective coping behaviors will improve Ability to maintain clinical measurements within normal limits will improve Compliance with prescribed medications will improve Ability to identify triggers associated with substance abuse/mental health issues will improve Ability to identify changes in lifestyle to reduce recurrence of condition will improve Ability to verbalize feelings will improve Ability to disclose and discuss suicidal ideas Ability to demonstrate self-control will improve Ability to identify and develop effective coping behaviors will improve Ability to maintain clinical measurements within normal limits will improve Compliance with prescribed medications will improve Ability to identify triggers associated with  substance abuse/mental health issues will improve  Medication Management: Evaluate patient's response, side effects, and tolerance of medication regimen.  Therapeutic Interventions: 1 to 1 sessions, Unit Group sessions and Medication administration.  Evaluation of Outcomes: Met  Physician Treatment Plan for Secondary Diagnosis: Active Problems:   Bipolar disorder ([place])  Long Term  Goal(s): Improvement in symptoms so as ready for discharge Improvement in symptoms so as ready for discharge   Short Term Goals: Ability to identify changes in lifestyle to reduce recurrence of condition will improve Ability to verbalize feelings will improve Ability to disclose and discuss suicidal ideas Ability to demonstrate self-control will improve Ability to identify and develop effective coping behaviors will improve Ability to maintain clinical measurements within normal limits will improve Compliance with prescribed medications will improve Ability to identify triggers associated with substance abuse/mental health issues will improve Ability to identify changes in lifestyle to reduce recurrence of condition will improve Ability to verbalize feelings will improve Ability to disclose and discuss suicidal ideas Ability to demonstrate self-control will improve Ability to identify and develop effective coping behaviors will improve Ability to maintain clinical measurements within normal limits will improve Compliance with prescribed medications will improve Ability to identify triggers associated with substance abuse/mental health issues will improve     Medication Management: Evaluate patient's response, side effects, and tolerance of medication regimen.  Therapeutic Interventions: 1 to 1 sessions, Unit Group sessions and Medication administration.  Evaluation of Outcomes: Met   RN Treatment Plan for Primary Diagnosis: Opiate Use Disorder Long Term Goal(s): Knowledge of disease and therapeutic regimen to maintain health will improve  Short Term Goals: Ability to remain free from injury will improve, Ability to disclose and discuss suicidal ideas and Ability to identify and develop effective coping behaviors will improve  Medication Management: RN will administer medications as ordered by provider, will assess and evaluate patient's response and provide education to patient for  prescribed medication. RN will report any adverse and/or side effects to prescribing provider.  Therapeutic Interventions: 1 on 1 counseling sessions, Psychoeducation, Medication administration, Evaluate responses to treatment, Monitor vital signs and CBGs as ordered, Perform/monitor CIWA, COWS, AIMS and Fall Risk screenings as ordered, Perform wound care treatments as ordered.  Evaluation of Outcomes: Met   LCSW Treatment Plan for Primary Diagnosis:Opiate Use Disorder Long Term Goal(s): Safe transition to appropriate next level of care at discharge, Engage patient in therapeutic group addressing interpersonal concerns.  Short Term Goals: Engage patient in aftercare planning with referrals and resources, Facilitate patient progression through stages of change regarding substance use diagnoses and concerns and Identify triggers associated with mental health/substance abuse issues  Therapeutic Interventions: Assess for all discharge needs, 1 to 1 time with Social worker, Explore available resources and support systems, Assess for adequacy in community support network, Educate family and significant other(s) on suicide prevention, Complete Psychosocial Assessment, Interpersonal group therapy.  Evaluation of Outcomes: Met   Progress in Treatment: Attending groups: Yes. Participating in groups: Yes. Taking medication as prescribed: Yes. Toleration medication: Yes. Family/Significant other contact made: SPE completed with both patient and her mother.  Patient understands diagnosis: Yes. Discussing patient identified problems/goals with staff: Yes. Medical problems stabilized or resolved: Yes. Denies suicidal/homicidal ideation: Yes. Issues/concerns per patient self-inventory: No. Other: n/a  New problem(s) identified: No, Describe:  n/a  New Short Term/Long Term Goal(s): detox; medication stabilization; development of comprehensive mental wellness/sobriety plan.   Discharge Plan or  Barriers: CSW assessing for appropriate referrals. During 2015 admission; pt was  [facility] (outpatient treatment).  Reason for Continuation of Hospitalization: none  Estimated Length of Stay: discharge today   Attendees: Patient: 08/03/2016 8:23 AM  Physician: Dr. Mallie Darting MD 08/03/2016 8:23 AM  Nursing: Ginette Otto RN 08/03/2016 8:23 AM  RN Care Manager: Lars Pinks CM 08/03/2016 8:23 AM  Social Worker: Maxie Better, LCSW;  08/03/2016 8:23 AM  Recreational Therapist: Rhunette Croft 08/03/2016 8:23 AM  Other: Lindell Spar NP; Samuel Jester NP 08/03/2016 8:23 AM  Other:  08/03/2016 8:23 AM  Other: 08/03/2016 8:23 AM    Scribe for Treatment Team: [place], LCSW 08/03/2016 8:23 AM

## 2016-08-03 NOTE — [facility] Suicide Risk Assessment (Signed)
[facility] Discharge Suicide Risk Assessment   Principal Problem: <principal problem not specified> Discharge Diagnoses:  Patient Active Problem List   Diagnosis Date Noted  . Bipolar disorder ([place]) [F31.9] 07/31/2016  . Major depressive disorder, recurrent episode with anxious distress ([place]) [F33.9] 07/29/2016  . Normal labor [O80, Z37.9] 07/26/2016  . Foreign body granuloma of soft tissue, NEC, right upper arm [M60.221]   . Pyelonephritis [N12] 05/28/2016  . Polysubstance abuse [F19.10] 05/25/2016  . No prenatal care in current pregnancy [O09.30] 05/25/2016  . Chronic hypertension affecting pregnancy [O10.919] 05/25/2016  . History of cesarean delivery affecting pregnancy [O34.219] 05/25/2016  . Community acquired pneumonia [J18.9] 05/25/2016  . Pyelonephritis affecting pregnancy [O23.00] 05/24/2016  . Palliative care encounter [Z51.5]   . Abscess of right breast [N61.1] 07/13/2014  . Opioid use disorder, severe, dependence ([place]) [F11.20] 08/25/2013  . Unspecified episodic mood disorder [F39] 08/25/2013  . ADHD (attention deficit hyperactivity disorder) [F90.9] 08/25/2013  . Cocaine abuse [F14.10] 08/25/2013  . Thoracic back pain [M54.6] 01/29/2013  . CROHN'S DISEASE [K50.90] 05/03/2005  . COLONIC POLYPS [D12.6] 02/28/2003    Total Time spent with patient: 20 minutes  Musculoskeletal: Strength & Muscle Tone: within normal limits Gait & Station: normal Patient leans: N/A  Psychiatric Specialty Exam: Review of Systems  All other systems reviewed and are negative.   Blood pressure 127/85, pulse 79, temperature 99.3 F (37.4 C), temperature source Oral, resp. rate 18, height 5' 7"  (1.702 m), weight 78.9 kg (174 lb).Body mass index is 27.25 kg/m.  General Appearance: Casual  Eye Contact::  Fair  Speech:  Normal Rate409  Volume:  Normal  Mood:  Anxious  Affect:  Appropriate  Thought Process:  Coherent  Orientation:  Full (Time, Place, and Person)  Thought Content:  Logical   Suicidal Thoughts:  No  Homicidal Thoughts:  No  Memory:  Immediate;   Good  Judgement:  Fair  Insight:  Present  Psychomotor Activity:  Increased  Concentration:  Fair  Recall:  [place] of Knowledge:Fair  Language: Fair  Akathisia:  No  Handed:  Right  AIMS (if indicated):     Assets:  Communication Skills Desire for Improvement Resilience  Sleep:  Number of Hours: 6.25  Cognition: WNL  ADL's:  Intact   Mental Status Per Nursing Assessment::   On Admission:     Demographic Factors:  Low socioeconomic status and Unemployed  Loss Factors: Loss of significant relationship, Decline in physical health and Financial problems/change in socioeconomic status  Historical Factors: Impulsivity  Risk Reduction Factors:   Sense of responsibility to family and Positive therapeutic relationship  Continued Clinical Symptoms:  Depression:   Impulsivity Alcohol/Substance Abuse/Dependencies  Cognitive Features That Contribute To Risk:  Thought constriction (tunnel vision)    Suicide Risk:  Minimal: No identifiable suicidal ideation.  Patients presenting with no risk factors but with morbid ruminations; may be classified as minimal risk based on the severity of the depressive symptoms  Follow-up Information    [facility] Follow up on 08/03/2016.   Why:  You have been accepted into this program. Sherrill Raring will pick you up on Friday at 10:00AM from the hospital. Thank you.  Contact information: [address] [place], [place] [address] Phone: [phone] Fax: [phone]          Plan Of Care/Follow-up recommendations:  Activity:  ad lib  Sharma Covert, MD 08/03/2016, 7:44 AM

## 2016-08-03 NOTE — Discharge Summary (Signed)
Physician Discharge Summary Note  Patient:  Kelly [name] is an 31 y.o., female MRN:  [id number] DOB:  Sep 15, 1985 Patient phone:  [phone] (home)  Patient address:   [place] [address],  Total Time spent with patient: 45 minutes  Date of Admission:  07/31/2016 Date of Discharge: 08/03/2016  Reason for Admission:  Depression, heroin addict  Principal Problem: Bipolar disorder [facility]) Discharge Diagnoses: Patient Active Problem List   Diagnosis Date Noted  . Bipolar disorder ([place]) [F31.9] 07/31/2016    Priority: High  . Major depressive disorder, recurrent episode with anxious distress ([place]) [F33.9] 07/29/2016  . Normal labor [O80, Z37.9] 07/26/2016  . Foreign body granuloma of soft tissue, NEC, right upper arm [M60.221]   . Pyelonephritis [N12] 05/28/2016  . Polysubstance abuse [F19.10] 05/25/2016  . No prenatal care in current pregnancy [O09.30] 05/25/2016  . Chronic hypertension affecting pregnancy [O10.919] 05/25/2016  . History of cesarean delivery affecting pregnancy [O34.219] 05/25/2016  . Community acquired pneumonia [J18.9] 05/25/2016  . Pyelonephritis affecting pregnancy [O23.00] 05/24/2016  . Palliative care encounter [Z51.5]   . Abscess of right breast [N61.1] 07/13/2014  . Opioid use disorder, severe, dependence ([place]) [F11.20] 08/25/2013  . Unspecified episodic mood disorder [F39] 08/25/2013  . ADHD (attention deficit hyperactivity disorder) [F90.9] 08/25/2013  . Cocaine abuse [F14.10] 08/25/2013  . Thoracic back pain [M54.6] 01/29/2013  . CROHN'S DISEASE [K50.90] 05/03/2005  . COLONIC POLYPS [D12.6] 02/28/2003    Past Psychiatric History:  See HPI  Past Medical History:  Past Medical History:  Diagnosis Date  . Abscess of breast    rt breast  . ADD (attention deficit disorder)   . Back pain   . Bipolar 1 disorder ([place])   . Hypertension   . Polysubstance abuse   . Suicidal overdose [facility])     Past Surgical History:  Procedure  Laterality Date  . BACK SURGERY     scoliosis-in care everywhere 08/1999  . CESAREAN SECTION    . CHOLECYSTECTOMY  March 2015  . FOREIGN BODY REMOVAL Right 05/28/2016   Procedure: REMOVAL FOREIGN BODY RIGHT ARM;  Surgeon: Meredith Pel, MD;  Location: [place];  Service: Orthopedics;  Laterality: Right;  . IRRIGATION AND DEBRIDEMENT ABSCESS Right 07/13/2014   Procedure: IRRIGATION AND DEBRIDEMEN TRIGHT BREAST ABSCESS;  Surgeon: Donnie Mesa, MD;  Location: [place];  Service: General;  Laterality: Right;   Family History:  Family History  Problem Relation Age of Onset  . Diabetes Mother   . Diabetes Father   . Hypertension Father   . Heart attack Neg Hx   . Hyperlipidemia Neg Hx   . Sudden death Neg Hx    Family Psychiatric  History: see HPI Social History:  History  Alcohol Use  . Yes    Comment: one or two drinks weekly     History  Drug Use  . Types: Cocaine, Heroin, IV    Comment: heroin, crack cocaine    Social History   Social History  . Marital status: Single    Spouse name: N/A  . Number of children: N/A  . Years of education: N/A   Social History Main Topics  . Smoking status: Current Every Day Smoker    Packs/day: 1.00    Years: 13.00    Types: Cigarettes  . Smokeless tobacco: Never Used  . Alcohol use Yes     Comment: one or two drinks weekly  . Drug use: Yes    Types: Cocaine, Heroin, IV  Comment: heroin, crack cocaine  . Sexual activity: Yes    Birth control/ protection: Condom   Other Topics Concern  . None   Social History Narrative  . None    Hospital Course:  Kelly [name], 31 yo female with multi year of opiate dependence presented to [facility] for detox.  Patient had just delivered a baby, is in the NICU.  Patient abuses cocaine and heroin.    Kelly [name] was admitted for Bipolar disorder [facility]) and crisis management.  Patient was treated with medications with their indications listed below in detail under Medication List.  Medical  problems were identified and treated as needed.  Home medications were restarted as appropriate.  Improvement was monitored by observation and Kelly [name] daily report of symptom reduction.  Emotional and mental status was monitored by daily self inventory reports completed by Kelly [name] and clinical staff.  Patient reported continued improvement, denied any new concerns.  Patient had been compliant on medications and denied side effects.  Support and encouragement was provided.    Kelly [name] was evaluated by the treatment team for stability and plans for continued recovery upon discharge.  Patient was offered further treatment options upon discharge including Residential, Intensive Outpatient and Outpatient treatment. Patient will follow up with agency listed below for medication management and counseling.  Encouraged patient to maintain satisfactory support network and home environment.  Advised to adhere to medication compliance and outpatient treatment follow up.  Prescriptions provided.       Kelly [name] motivation was an Medical sales representative for scheduling further treatment.  Employment, transportation, bed availability, health status, family support, and any pending legal issues were also considered during patient's hospital stay.  Upon completion of this admission the patient was both mentally and medically stable for discharge denying suicidal/homicidal ideation, auditory/visual/tactile hallucinations, delusional thoughts and paranoia.      Physical Findings: AIMS: Facial and Oral Movements Muscles of Facial Expression: None, normal Lips and Perioral Area: None, normal Jaw: None, normal Tongue: None, normal,Extremity Movements Upper (arms, wrists, hands, fingers): None, normal Lower (legs, knees, ankles, toes): None, normal, Trunk Movements Neck, shoulders, hips: None, normal, Overall Severity Severity of abnormal movements (highest score from questions above):  None, normal Incapacitation due to abnormal movements: None, normal Patient's awareness of abnormal movements (rate only patient's report): No Awareness, Dental Status Current problems with teeth and/or dentures?: No Does patient usually wear dentures?: No  CIWA:    COWS:  COWS Total Score: 2  Musculoskeletal: Strength & Muscle Tone: within normal limits Gait & Station: normal Patient leans: N/A  Psychiatric Specialty Exam: Physical Exam  Nursing note and vitals reviewed. Psychiatric: She has a normal mood and affect. Her behavior is normal. Judgment and thought content normal. Cognition and memory are normal.    ROS  Blood pressure 123/90, pulse 84, temperature 99.2 F (37.3 C), temperature source Oral, resp. rate 18, height 5' 7"  (1.702 m), weight 78.9 kg (174 lb).Body mass index is 27.25 kg/m.    Have you used any form of tobacco in the last 30 days? (Cigarettes, Smokeless Tobacco, Cigars, and/or Pipes): Yes  Has this patient used any form of tobacco in the last 30 days? (Cigarettes, Smokeless Tobacco, Cigars, and/or Pipes) Yes, Rx given  Blood Alcohol level:  Lab Results  Component Value Date   [facility] <5 07/28/2016   ETH <11 62/83/1517    Metabolic Disorder Labs:  Lab Results  Component Value Date   HGBA1C 4.9 05/24/2016  MPG 94 05/24/2016   No results found for: PROLACTIN No results found for: CHOL, TRIG, HDL, CHOLHDL, VLDL, LDLCALC  See Psychiatric Specialty Exam and Suicide Risk Assessment completed by Attending Physician prior to discharge.  Discharge destination:  Home  Is patient on multiple antipsychotic therapies at discharge:  No   Has Patient had three or more failed trials of antipsychotic monotherapy by history:  No  Recommended Plan for Multiple Antipsychotic Therapies: NA   Allergies as of 08/03/2016      Reactions   Hydrocodone Itching   Morphine And Related Nausea And Vomiting      Medication List    TAKE these medications     Indication   buPROPion 150 MG 24 hr tablet Commonly known as:  WELLBUTRIN XL Take 1 tablet (150 mg total) by mouth daily. Start taking on:  08/04/2016  Indication:  Major Depressive Disorder   gabapentin 300 MG capsule Commonly known as:  NEURONTIN Take 1 capsule (300 mg total) by mouth 3 (three) times daily.  Indication:  Aggressive Behavior, Agitation, anxiety   hydrOXYzine 50 MG tablet Commonly known as:  ATARAX/VISTARIL Take 1 tablet (50 mg total) by mouth every 6 (six) hours as needed for anxiety.  Indication:  Anxiety Neurosis   nicotine 21 mg/24hr patch Commonly known as:  NICODERM CQ - dosed in mg/24 hours Place 1 patch (21 mg total) onto the skin daily. Start taking on:  08/04/2016  Indication:  Nicotine Addiction   traZODone 50 MG tablet Commonly known as:  DESYREL Take 1 tablet (50 mg total) by mouth at bedtime as needed for sleep.  Indication:  Trouble Sleeping      Follow-up Information    [facility] Follow up on 08/03/2016.   Why:  You have been accepted into this program. Sherrill Raring will pick you up on Friday at 10:00AM from the hospital. Thank you.  Contact information: [address] [place], [place] [address] Phone: [phone] Fax: [phone]          Follow-up recommendations:  Activity:  as tol Diet:  as tol  Comments:  1.  Take all your medications as prescribed.   2.  Report any adverse side effects to outpatient provider. 3.  Patient instructed to not use alcohol or illegal drugs while on prescription medicines. 4.  In the event of worsening symptoms, instructed patient to call 911, the crisis hotline or go to nearest emergency room for evaluation of symptoms.  Signed: Janett Labella, NP [facility] 08/03/2016, 10:59 AM

## 2016-08-29 ENCOUNTER — Emergency Department (HOSPITAL_BASED_OUTPATIENT_CLINIC_OR_DEPARTMENT_OTHER): Payer: Medicaid Other

## 2016-08-29 ENCOUNTER — Observation Stay (HOSPITAL_BASED_OUTPATIENT_CLINIC_OR_DEPARTMENT_OTHER)
Admission: EM | Admit: 2016-08-29 | Discharge: 2016-08-29 | Disposition: A | Discharge status: Home / Self Care | Payer: Medicaid Other | Attending: Internal Medicine | Admitting: Internal Medicine

## 2016-08-29 ENCOUNTER — Encounter (HOSPITAL_BASED_OUTPATIENT_CLINIC_OR_DEPARTMENT_OTHER): Payer: Self-pay | Admitting: Emergency Medicine

## 2016-08-29 DIAGNOSIS — L0201 Cutaneous abscess of face: Secondary | ICD-10-CM | POA: Diagnosis not present

## 2016-08-29 DIAGNOSIS — N3001 Acute cystitis with hematuria: Secondary | ICD-10-CM

## 2016-08-29 DIAGNOSIS — F17210 Nicotine dependence, cigarettes, uncomplicated: Secondary | ICD-10-CM | POA: Insufficient documentation

## 2016-08-29 DIAGNOSIS — Z79899 Other long term (current) drug therapy: Secondary | ICD-10-CM | POA: Diagnosis not present

## 2016-08-29 DIAGNOSIS — R220 Localized swelling, mass and lump, head: Secondary | ICD-10-CM

## 2016-08-29 DIAGNOSIS — I10 Essential (primary) hypertension: Secondary | ICD-10-CM | POA: Diagnosis not present

## 2016-08-29 DIAGNOSIS — F1110 Opioid abuse, uncomplicated: Secondary | ICD-10-CM | POA: Insufficient documentation

## 2016-08-29 DIAGNOSIS — F319 Bipolar disorder, unspecified: Secondary | ICD-10-CM | POA: Diagnosis not present

## 2016-08-29 DIAGNOSIS — L03211 Cellulitis of face: Secondary | ICD-10-CM | POA: Diagnosis not present

## 2016-08-29 DIAGNOSIS — K130 Diseases of lips: Secondary | ICD-10-CM | POA: Diagnosis not present

## 2016-08-29 DIAGNOSIS — F1990 Other psychoactive substance use, unspecified, uncomplicated: Secondary | ICD-10-CM

## 2016-08-29 LAB — CBC WITH DIFFERENTIAL/PLATELET
Basophils Absolute: 0 10*3/uL (ref 0.0–0.1)
Basophils Relative: 0 %
EOS ABS: 0.1 10*3/uL (ref 0.0–0.7)
EOS PCT: 1 %
HCT: 33.3 % — ABNORMAL LOW (ref 36.0–46.0)
HEMOGLOBIN: 10.7 g/dL — AB (ref 12.0–15.0)
LYMPHS ABS: 2 10*3/uL (ref 0.7–4.0)
Lymphocytes Relative: 15 %
MCH: 29.2 pg (ref 26.0–34.0)
MCHC: 32.1 g/dL (ref 30.0–36.0)
MCV: 90.7 fL (ref 78.0–100.0)
MONO ABS: 0.9 10*3/uL (ref 0.1–1.0)
MONOS PCT: 7 %
NEUTROS PCT: 77 %
Neutro Abs: 10.9 10*3/uL — ABNORMAL HIGH (ref 1.7–7.7)
Platelets: 434 10*3/uL — ABNORMAL HIGH (ref 150–400)
RBC: 3.67 MIL/uL — ABNORMAL LOW (ref 3.87–5.11)
RDW: 13.8 % (ref 11.5–15.5)
WBC: 14 10*3/uL — ABNORMAL HIGH (ref 4.0–10.5)

## 2016-08-29 LAB — COMPREHENSIVE METABOLIC PANEL
ALK PHOS: 98 U/L (ref 38–126)
ALT: 7 U/L — AB (ref 14–54)
AST: 15 U/L (ref 15–41)
Albumin: 3.2 g/dL — ABNORMAL LOW (ref 3.5–5.0)
Anion gap: 9 (ref 5–15)
BUN: 6 mg/dL (ref 6–20)
CALCIUM: 9 mg/dL (ref 8.9–10.3)
CHLORIDE: 100 mmol/L — AB (ref 101–111)
CO2: 26 mmol/L (ref 22–32)
CREATININE: 0.6 mg/dL (ref 0.44–1.00)
GFR calc Af Amer: >60 mL/min (ref >60)
GFR calc non Af Amer: >60 mL/min (ref >60)
GLUCOSE: 117 mg/dL — AB (ref 65–99)
Potassium: 3.4 mmol/L — ABNORMAL LOW (ref 3.5–5.1)
SODIUM: 135 mmol/L (ref 135–145)
Total Bilirubin: 0.9 mg/dL (ref 0.3–1.2)
Total Protein: 6.9 g/dL (ref 6.5–8.1)

## 2016-08-29 LAB — I-STAT CG4 LACTIC ACID, ED: LACTIC ACID, VENOUS: 1.61 mmol/L (ref 0.5–1.9)

## 2016-08-29 LAB — URINALYSIS, ROUTINE W REFLEX MICROSCOPIC
BILIRUBIN URINE: NEGATIVE
Glucose, UA: NEGATIVE mg/dL
KETONES UR: NEGATIVE mg/dL
Nitrite: POSITIVE — AB
Protein, ur: NEGATIVE mg/dL
SPECIFIC GRAVITY, URINE: 1.021 (ref 1.005–1.030)
pH: 5.5 (ref 5.0–8.0)

## 2016-08-29 LAB — URINALYSIS, MICROSCOPIC (REFLEX)

## 2016-08-29 LAB — PREGNANCY, URINE: PREG TEST UR: NEGATIVE

## 2016-08-29 MED ORDER — CEFTRIAXONE SODIUM 1 G IJ SOLR
1.0000 g | Freq: Once | INTRAMUSCULAR | Status: AC
  Administered 2016-08-29: 1 g via INTRAVENOUS
  Filled 2016-08-29: qty 10

## 2016-08-29 MED ORDER — VANCOMYCIN HCL IN DEXTROSE 1-5 GM/200ML-% IV SOLN
1000.0000 mg | Freq: Once | INTRAVENOUS | Status: AC
  Administered 2016-08-29: 1000 mg via INTRAVENOUS
  Filled 2016-08-29: qty 200

## 2016-08-29 MED ORDER — ACETAMINOPHEN 325 MG PO TABS
650.0000 mg | ORAL_TABLET | Freq: Once | ORAL | Status: AC
  Administered 2016-08-29: 650 mg via ORAL
  Filled 2016-08-29: qty 2

## 2016-08-29 NOTE — ED Notes (Signed)
Patient transported to Ultrasound 

## 2016-08-29 NOTE — ED Triage Notes (Signed)
Patient reports that she popped a zit about 2 days ago - yesterday she noticed that her left face started to swell. Patient has swelling noted to her left cheek, lower lip and noted cellulitis. Denies any trouble swallowing or breathing.

## 2016-08-29 NOTE — ED Notes (Signed)
Pt states that she may just have to F/U with PCP in the AM because she has no one to watch her son. Advised pt against this educated pt on the risk including death if she does not continue treatment as recommended. Pt acknowledged and states that she will try to get over tonight. Advised pt that they would only hold a bed for her so long.

## 2016-08-29 NOTE — ED Provider Notes (Signed)
[facility] DEPT [facility] Provider Note   CSN: [id number] Arrival date & time: 08/29/16  1527  By signing my name below, I, Kelly [name], attest that this documentation has been prepared under the direction and in the presence of [name], PA-C. Electronically Signed: Hansel [name], ED Scribe. 08/29/16. 4:32 PM.    History   Chief Complaint Chief Complaint  Patient presents with  . Facial Swelling    HPI Kelly [name] is a 31 y.o. female with h/o polysubstance abuse who presents to the Emergency Department complaining of a moderate, gradually worsening area of redness, pain and swelling to the left chin and lower lip that began yesterday. Per pt, the area began as a zit and after she popped it she woke up the next morning with her symptoms. She states the area has had a small amount of drainage as well. Per pt, her pain is worsened with touch. Pt denies taking OTC medications at home to improve symptoms. She also denies current IVDU, but states she last injected heroin last week. She states she is currently receiving treatment in a Suboxone clinic, and is supposed to start taking Suboxone tomorrow. She denies fever, dental pain, chills, abdominal pain, emesis.   She also complains of dysuria for a week. She denies difficulty urinating, additional complaints.   The history is provided by the patient. No language interpreter was used.    Past Medical History:  Diagnosis Date  . Abscess of breast    rt breast  . ADD (attention deficit disorder)   . Back pain   . Bipolar 1 disorder ([place])   . Hypertension   . Polysubstance abuse   . Suicidal overdose [facility])     Patient Active Problem List   Diagnosis Date Noted  . Facial cellulitis 08/29/2016  . Bipolar disorder ([place]) 07/31/2016  . Major depressive disorder, recurrent episode with anxious distress ([place]) 07/29/2016  . Normal labor 07/26/2016  . Foreign body granuloma of soft tissue, NEC, right upper arm   . Pyelonephritis  05/28/2016  . Polysubstance abuse 05/25/2016  . No prenatal care in current pregnancy 05/25/2016  . Chronic hypertension affecting pregnancy 05/25/2016  . History of cesarean delivery affecting pregnancy 05/25/2016  . Community acquired pneumonia 05/25/2016  . Pyelonephritis affecting pregnancy 05/24/2016  . Palliative care encounter   . Abscess of right breast 07/13/2014  . Opioid use disorder, severe, dependence ([place]) 08/25/2013  . Unspecified episodic mood disorder 08/25/2013  . ADHD (attention deficit hyperactivity disorder) 08/25/2013  . Cocaine abuse 08/25/2013  . Thoracic back pain 01/29/2013  . CROHN'S DISEASE 05/03/2005  . COLONIC POLYPS 02/28/2003    Past Surgical History:  Procedure Laterality Date  . BACK SURGERY     scoliosis-in care everywhere 08/1999  . CESAREAN SECTION    . CHOLECYSTECTOMY  March 2015  . FOREIGN BODY REMOVAL Right 05/28/2016   Procedure: REMOVAL FOREIGN BODY RIGHT ARM;  Surgeon: Meredith Pel, MD;  Location: [place];  Service: Orthopedics;  Laterality: Right;  . IRRIGATION AND DEBRIDEMENT ABSCESS Right 07/13/2014   Procedure: IRRIGATION AND DEBRIDEMEN TRIGHT BREAST ABSCESS;  Surgeon: Donnie Mesa, MD;  Location: [place];  Service: General;  Laterality: Right;    OB History    Gravida Para Term Preterm AB Living   5 1 1   3 1    SAB TAB Ectopic Multiple Live Births   3       1       Home Medications    Prior to  Admission medications   Medication Sig Start Date End Date Taking? Authorizing Provider  buPROPion (WELLBUTRIN XL) 150 MG 24 hr tablet Take 1 tablet (150 mg total) by mouth daily. 08/04/16   Kerrie Buffalo, NP  gabapentin (NEURONTIN) 300 MG capsule Take 1 capsule (300 mg total) by mouth 3 (three) times daily. 08/03/16   Kerrie Buffalo, NP  hydrOXYzine (ATARAX/VISTARIL) 50 MG tablet Take 1 tablet (50 mg total) by mouth every 6 (six) hours as needed for anxiety. 08/03/16   Kerrie Buffalo, NP  nicotine (NICODERM CQ - DOSED IN MG/24 HOURS) 21  mg/24hr patch Place 1 patch (21 mg total) onto the skin daily. 08/04/16   Kerrie Buffalo, NP  traZODone (DESYREL) 50 MG tablet Take 1 tablet (50 mg total) by mouth at bedtime as needed for sleep. 08/03/16   Kerrie Buffalo, NP    Family History Family History  Problem Relation Age of Onset  . Diabetes Mother   . Diabetes Father   . Hypertension Father   . Heart attack Neg Hx   . Hyperlipidemia Neg Hx   . Sudden death Neg Hx     Social History Social History  Substance Use Topics  . Smoking status: Current Every Day Smoker    Packs/day: 1.00    Years: 13.00    Types: Cigarettes  . Smokeless tobacco: Never Used  . Alcohol use Yes     Comment: one or two drinks weekly     Allergies   Hydrocodone and Morphine and related   Review of Systems Review of Systems  Constitutional: Negative for chills and fever.  HENT: Positive for facial swelling. Negative for congestion, dental problem, mouth sores and sore throat.   Eyes: Negative for visual disturbance.  Respiratory: Negative for cough, shortness of breath and wheezing.   Cardiovascular: Negative for chest pain and palpitations.  Gastrointestinal: Negative for abdominal pain, diarrhea, nausea and vomiting.  Genitourinary: Positive for dysuria, frequency and urgency. Negative for difficulty urinating, flank pain, genital sores, hematuria, pelvic pain, vaginal bleeding and vaginal discharge.  Musculoskeletal: Negative for back pain and neck pain.  Skin: Positive for color change. Negative for rash.  Allergic/Immunologic: Negative for immunocompromised state.  Neurological: Negative for headaches.     Physical Exam Updated Vital Signs BP 118/64 (BP Location: Right Arm)   Pulse 64   Temp 99.6 F (37.6 C) (Oral)   Resp 18   Ht 5' 7"  (1.702 m)   Wt 72.6 kg   LMP  (LMP Unknown) Comment: pt refused to answer what her due date is  SpO2 98%   Breastfeeding? Unknown Comment: pt had baby 07/26/16  BMI 25.06 kg/m   Physical  Exam  Constitutional: She is oriented to person, place, and time. She appears well-developed and well-nourished. No distress.  Pt is non-toxic in appearance.   HENT:  Head: Normocephalic and atraumatic.  Right Ear: External ear normal.  Left Ear: External ear normal.  Mouth/Throat: Oropharynx is clear and moist.  Induration and edema noted to the patient's left lower chin and lip. This does not extend below the jawline. No tenderness beneath her mouth or jaw. No dental pain on palpation. There is no fluctuant abscess palpated. No discharge appreciated. There is a sore noted to her left anterior chin which she points to is where she expressed pus yesterday. Tongue protrusion is normal. Uvula is midline without edema. No evidence of peritonsillar abscess. No trismus.  Eyes: Conjunctivae and EOM are normal. Pupils are equal, round, and reactive to  light. Right eye exhibits no discharge. Left eye exhibits no discharge.  Neck: Normal range of motion. Neck supple.  Mild tender bilateral cervical lymphadenopathy.  Cardiovascular: Normal rate, regular rhythm, normal heart sounds and intact distal pulses.  Exam reveals no gallop and no friction rub.   No murmur heard. Pulmonary/Chest: Effort normal and breath sounds normal. No respiratory distress. She has no wheezes. She has no rales.  Abdominal: Soft. There is no tenderness. There is no guarding.  Musculoskeletal: She exhibits no edema.  Lymphadenopathy:    She has cervical adenopathy.  Neurological: She is alert and oriented to person, place, and time. No cranial nerve deficit or sensory deficit. Coordination normal.  Skin: Skin is warm and dry. Capillary refill takes less than 2 seconds. No rash noted. She is not diaphoretic. No erythema. No pallor.  See HEENT and pictures below. Track marks noted to her bilateral arms.  Psychiatric: She has a normal mood and affect. Her behavior is normal.  Nursing note and vitals reviewed.        ED  Treatments / Results   DIAGNOSTIC STUDIES: Oxygen Saturation is 100% on RA, normal by my interpretation.    COORDINATION OF CARE: 4:29 PM Discussed treatment plan with pt at bedside which includes labs and pt agreed to plan.    Labs (all labs ordered are listed, but only abnormal results are displayed) Labs Reviewed  COMPREHENSIVE METABOLIC PANEL - Abnormal; Notable for the following:       Result Value   Potassium 3.4 (*)    Chloride 100 (*)    Glucose, Bld 117 (*)    Albumin 3.2 (*)    ALT 7 (*)    All other components within normal limits  CBC WITH DIFFERENTIAL/PLATELET - Abnormal; Notable for the following:    WBC 14.0 (*)    RBC 3.67 (*)    Hemoglobin 10.7 (*)    HCT 33.3 (*)    Platelets 434 (*)    Neutro Abs 10.9 (*)    All other components within normal limits  URINALYSIS, ROUTINE W REFLEX MICROSCOPIC - Abnormal; Notable for the following:    Color, Urine AMBER (*)    APPearance CLOUDY (*)    Hgb urine dipstick TRACE (*)    Nitrite POSITIVE (*)    Leukocytes, UA LARGE (*)    All other components within normal limits  URINALYSIS, MICROSCOPIC (REFLEX) - Abnormal; Notable for the following:    Bacteria, UA MANY (*)    Squamous Epithelial / LPF 0-5 (*)    All other components within normal limits  URINE CULTURE  CULTURE, BLOOD (ROUTINE X 2)  CULTURE, BLOOD (ROUTINE X 2)  PREGNANCY, URINE  I-STAT CG4 LACTIC ACID, ED  I-STAT CG4 LACTIC ACID, ED    EKG  EKG Interpretation None       Radiology [place] Misc Soft Tissue  Result Date: 08/29/2016 CLINICAL DATA:  Facial swelling. Patient reports popping a pimple 4 days ago. Swelling and redness for 2 days. EXAM: ULTRASOUND OF HEAD/NECK SOFT TISSUES TECHNIQUE: Ultrasound examination of the head and neck soft tissues was performed in the area of clinical concern. COMPARISON:  None. FINDINGS: Targeted ultrasound of the left facial soft tissues in the area of clinical concern demonstrates prominent diffuse soft tissue edema  with increased vascularity. A heterogeneously hypoechoic region near the left lower lip measures 2.4 x 0.9 x 1.3 cm, and a smaller heterogeneously hypoechoic focus in the left chin measures 11 x 7 x 4 mm.  A well-defined, clearly drainable fluid collection is not identified however. IMPRESSION: Extensive edema and inflammatory/phlegmonous changes in the soft tissues of the left lower face. No well-defined, drainable abscess identified. Electronically Signed   By: Logan Bores M.D.   On: 08/29/2016 17:43    Procedures Procedures (including critical care time)  Medications Ordered in ED Medications  acetaminophen (TYLENOL) tablet 650 mg (650 mg Oral Given 08/29/16 1726)  cefTRIAXone (ROCEPHIN) 1 g in dextrose 5 % 50 mL IVPB (0 g Intravenous Stopped 08/29/16 1951)  vancomycin (VANCOCIN) IVPB 1000 mg/200 mL premix (0 mg Intravenous Stopped 08/29/16 2047)     Initial Impression / Assessment and Plan / ED Course  I have reviewed the triage vital signs and the nursing notes.  Pertinent labs & imaging results that were available during my care of the patient were reviewed by me and considered in my medical decision making (see chart for details).      This  is a 31 y.o. female with h/o polysubstance abuse who presents to the Emergency Department complaining of a moderate, gradually worsening area of redness, pain and swelling to the left chin and lower lip that began yesterday. Per pt, the area began as a zit and after she popped it she woke up the next morning with her symptoms. She states the area has had a small amount of drainage as well. Per pt, her pain is worsened with touch. Pt denies taking OTC medications at home to improve symptoms. She also denies current IVDU, but states she last injected heroin last week. On exam patient is afebrile nontoxic appearing. She has facial swelling as noted above. This is not extend below her jawline. She has no trismus. No drooling. Tongue protrusion is normal.  There is no fluctuant abscess on exam. No dental pain to palpation. CBC is remarkable for white count of 14,000. Lactic acid is within normal limits. Blood cultures sent. Urinalysis is nitrite positive with large leukocytes. Soft tissue ultrasound shows extensive edema and inflammatory changes in the soft tissues of the left lower face. No well-defined or drainable abscess identified. We'll cover with Rocephin and vancomycin due to patient's UTI as well as soft tissue infection. Plan for admission for IV antibiotics.  Dr. Alvino Chapel consulted with triad hospitalist Dr. Lorin Mercy who accepted the patient for admission.   Patient refuses transport by ambulance service [facility] to [facility]. I asked her to home and understand her concerns. She tells me she just does not want to go by ambulance and refuses to say anything else. I discussed this with my attending. I will have her complete antibiotics here and then we will remove her IV and allow her to travel POV with her sister to [place]. I advised that this was not recommended and I encouraged her multiple times to stay to have be transported by [facility]. She refuses. Patient completed abx and IV was removed. She left with her sister to travel to [facility].   This patient was discussed with Dr. Alvino Chapel who agrees with assessment and plan.  Final Clinical Impressions(s) / ED Diagnoses   Final diagnoses:  Facial swelling  Facial cellulitis  Acute cystitis with hematuria  Heroin abuse  IV drug user    New Prescriptions New Prescriptions   No medications on file    I personally performed the services described in this documentation, which was scribed in my presence. The recorded information has been reviewed and is accurate.  [name], PA-C 08/29/16 2145    Davonna Belling, MD 09/05/16 [phone]

## 2016-08-29 NOTE — ED Notes (Addendum)
Call to [place] and  [name] to verify that pt had not showed up. Attempted to call pt no answer.  will disposition pt to [place] status, and DC

## 2016-08-29 NOTE — Progress Notes (Signed)
Patient is a 31yo female with h/o polysubstance abuse including use of IV heroin presenting with facial cellulitis with abscess.  Ultrasound without fluid collection.  Needs at least observation with IV antibiotics; if not improving, will need ENT consultation for possible surgical intervention.  Will accept to [facility].  Carlyon Shadow, M.D.

## 2016-08-29 NOTE — ED Notes (Signed)
Pt is going to [facility] via POV

## 2016-08-29 NOTE — ED Notes (Signed)
IV attempted x2 without success.

## 2016-08-29 NOTE — ED Notes (Signed)
Report given to [name] on [place]. Also called Naoma Diener at [facility] to let her know that pt may or may not show up at [name] will call [facility] back at 11PM if pt remains on the board and DC as instructed.

## 2016-08-29 NOTE — ED Notes (Signed)
Pt refusing transport to [facility] via [facility] EDP notified

## 2016-08-29 NOTE — ED Notes (Signed)
Pt also reports dysuria for several days.

## 2016-08-31 LAB — URINE CULTURE: Culture: >=100000 — AB

## 2016-09-01 ENCOUNTER — Telehealth: Payer: Self-pay

## 2016-09-01 NOTE — Telephone Encounter (Signed)
Post ED Visit - Positive Culture Follow-up: Unsuccessful Patient Follow-up  Culture assessed and recommendations reviewed by:  []  Elenor Quinones, Pharm.D. []  Heide Guile, Pharm.D., BCPS AQ-ID []  Parks Neptune, Pharm.D., BCPS []  Alycia Rossetti, Pharm.D., BCPS []  [place], [place].D., BCPS, AAHIVP []  Legrand [place], Pharm.D., BCPS, AAHIVP [x]  Salome Arnt, PharmD, BCPS []  Dimitri Ped, PharmD, BCPS []  Vincenza Hews, PharmD, BCPS  Positive urine culture  [x]  Patient discharged without antimicrobial prescription and treatment is now indicated []  Organism is resistant to prescribed ED discharge antimicrobial []  Patient with positive blood cultures   This pt needs to return to hospital for IV abx treatment. Left AMA on 08/29/16 Unable to contact patient after 3 attempts, letter will be sent to address on file  Genia Del 09/01/2016, 10:29 AM

## 2016-09-01 NOTE — Progress Notes (Signed)
ED Antimicrobial Stewardship Positive Culture Follow Up   Kelly [name] is an 31 y.o. female who presented to [facility] on 08/29/2016 with a chief complaint of  Chief Complaint  Patient presents with  . Facial Swelling    Recent Results (from the past 720 hour(s))  Urine culture     Status: Abnormal   Collection Time: 08/29/16  4:03 PM  Result Value Ref Range Status   Specimen Description URINE, RANDOM  Final   Special Requests NONE  Final   Culture >=100,000 COLONIES/mL ESCHERICHIA COLI (A)  Final   Report Status 08/31/2016 FINAL  Final   Organism ID, Bacteria ESCHERICHIA COLI (A)  Final      Susceptibility   Escherichia coli - MIC*    AMPICILLIN >=32 RESISTANT Resistant     CEFAZOLIN <=4 SENSITIVE Sensitive     CEFTRIAXONE <=1 SENSITIVE Sensitive     CIPROFLOXACIN <=0.25 SENSITIVE Sensitive     GENTAMICIN <=1 SENSITIVE Sensitive     IMIPENEM <=0.25 SENSITIVE Sensitive     NITROFURANTOIN <=16 SENSITIVE Sensitive     TRIMETH/SULFA >=320 RESISTANT Resistant     AMPICILLIN/SULBACTAM 16 INTERMEDIATE Intermediate     PIP/TAZO <=4 SENSITIVE Sensitive     Extended ESBL NEGATIVE Sensitive     * >=100,000 COLONIES/mL ESCHERICHIA COLI  Blood culture (routine x 2)     Status: None (Preliminary result)   Collection Time: 08/29/16  4:52 PM  Result Value Ref Range Status   Specimen Description BLOOD BLOOD LEFT FOREARM  Final   Special Requests   Final    BOTTLES DRAWN AEROBIC AND ANAEROBIC Blood Culture adequate volume   Culture   Final    NO GROWTH < 12 HOURS Performed at [facility], [address], [place], [place] [address]    Report Status PENDING  Incomplete  Blood culture (routine x 2)     Status: None (Preliminary result)   Collection Time: 08/29/16  6:40 PM  Result Value Ref Range Status   Specimen Description BLOOD BLOOD RIGHT WRIST  Final   Special Requests   Final    BOTTLES DRAWN AEROBIC AND ANAEROBIC Blood Culture adequate volume   Culture   Final    NO  GROWTH < 12 HOURS Performed at [facility], [place] [address], [place], [place] [address]    Report Status PENDING  Incomplete    []  Treated with N/A, organism resistant to prescribed antimicrobial [x]  Patient discharged originally without antimicrobial agent and treatment is now indicated  New antibiotic prescription: Pt needs to be admitted to the hospital as previously instructed for IV antibiotics for facial cellulitis/abscess and now UTI  ED Provider: Wyn Quaker, PA   [name], [name] 09/01/2016, 9:54 AM Clinical Pharmacist Phone# [phone]

## 2016-09-03 LAB — CULTURE, BLOOD (ROUTINE X 2)
Culture: NO GROWTH
Culture: NO GROWTH
SPECIAL REQUESTS: ADEQUATE
SPECIAL REQUESTS: ADEQUATE

## 2016-09-06 ENCOUNTER — Telehealth: Payer: Self-pay | Admitting: Medical

## 2016-09-06 ENCOUNTER — Ambulatory Visit: Payer: Self-pay | Admitting: Medical

## 2016-09-06 NOTE — Telephone Encounter (Signed)
Called patient due to missed postpartum appointment, No answer, no voicemail setup. Sending certified letter to patient.

## 2016-10-15 ENCOUNTER — Encounter (HOSPITAL_COMMUNITY): Payer: Self-pay | Admitting: Orthopedic Surgery

## 2016-10-15 NOTE — Addendum Note (Signed)
Addendum  created 10/15/16 1004 by Oleta Mouse, MD   Sign clinical note

## 2016-11-13 ENCOUNTER — Telehealth: Payer: Self-pay | Admitting: Emergency Medicine

## 2016-11-13 NOTE — Telephone Encounter (Signed)
LOST TO FOLLOWUP

## 2017-04-16 ENCOUNTER — Other Ambulatory Visit: Payer: Self-pay

## 2017-04-16 ENCOUNTER — Emergency Department (HOSPITAL_COMMUNITY)
Admission: EM | Admit: 2017-04-16 | Discharge: 2017-04-16 | Discharge status: Against Medical Advice | Payer: Medicaid Other | Attending: Emergency Medicine | Admitting: Emergency Medicine

## 2017-04-16 ENCOUNTER — Encounter (HOSPITAL_COMMUNITY): Payer: Self-pay | Admitting: *Deleted

## 2017-04-16 DIAGNOSIS — L0201 Cutaneous abscess of face: Secondary | ICD-10-CM | POA: Insufficient documentation

## 2017-04-16 DIAGNOSIS — Z5321 Procedure and treatment not carried out due to patient leaving prior to being seen by health care provider: Secondary | ICD-10-CM | POA: Insufficient documentation

## 2017-04-16 NOTE — ED Notes (Signed)
Unable to locate pt for recheck vitals

## 2017-04-16 NOTE — ED Triage Notes (Signed)
Pt has large abscess to right side of her face. Denies dental pain. Reports fever pta. Airway intact.

## 2017-04-16 NOTE — ED Notes (Signed)
Writer called twice for room assignment, no response

## 2017-11-17 ENCOUNTER — Encounter (HOSPITAL_COMMUNITY): Payer: Self-pay | Admitting: *Deleted

## 2017-11-17 ENCOUNTER — Emergency Department (HOSPITAL_COMMUNITY)
Admission: EM | Admit: 2017-11-17 | Discharge: 2017-11-17 | Disposition: A | Discharge status: Home / Self Care | Payer: Medicaid Other | Attending: Emergency Medicine | Admitting: Emergency Medicine

## 2017-11-17 ENCOUNTER — Other Ambulatory Visit: Payer: Self-pay

## 2017-11-17 ENCOUNTER — Emergency Department (HOSPITAL_COMMUNITY): Payer: Medicaid Other

## 2017-11-17 DIAGNOSIS — N3001 Acute cystitis with hematuria: Secondary | ICD-10-CM | POA: Insufficient documentation

## 2017-11-17 DIAGNOSIS — M545 Low back pain, unspecified: Secondary | ICD-10-CM

## 2017-11-17 DIAGNOSIS — F17210 Nicotine dependence, cigarettes, uncomplicated: Secondary | ICD-10-CM | POA: Insufficient documentation

## 2017-11-17 DIAGNOSIS — I10 Essential (primary) hypertension: Secondary | ICD-10-CM | POA: Insufficient documentation

## 2017-11-17 LAB — URINALYSIS, ROUTINE W REFLEX MICROSCOPIC
Bilirubin Urine: NEGATIVE
GLUCOSE, UA: NEGATIVE mg/dL
Ketones, ur: 5 mg/dL — AB
NITRITE: POSITIVE — AB
Protein, ur: 100 mg/dL — AB
SPECIFIC GRAVITY, URINE: 1.019 (ref 1.005–1.030)
WBC, UA: >50 WBC/hpf — ABNORMAL HIGH (ref 0–5)
pH: 5 (ref 5.0–8.0)

## 2017-11-17 LAB — CBC WITH DIFFERENTIAL/PLATELET
BASOS PCT: 0 %
Basophils Absolute: 0 10*3/uL (ref 0.0–0.1)
EOS ABS: 0 10*3/uL (ref 0.0–0.7)
Eosinophils Relative: 0 %
HCT: 38.1 % (ref 36.0–46.0)
Hemoglobin: 12.4 g/dL (ref 12.0–15.0)
Lymphocytes Relative: 5 %
Lymphs Abs: 0.7 10*3/uL (ref 0.7–4.0)
MCH: 30.2 pg (ref 26.0–34.0)
MCHC: 32.5 g/dL (ref 30.0–36.0)
MCV: 92.9 fL (ref 78.0–100.0)
MONOS PCT: 8 %
Monocytes Absolute: 1.3 10*3/uL — ABNORMAL HIGH (ref 0.1–1.0)
Neutro Abs: 14 10*3/uL — ABNORMAL HIGH (ref 1.7–7.7)
Neutrophils Relative %: 87 %
Platelets: 292 10*3/uL (ref 150–400)
RBC: 4.1 MIL/uL (ref 3.87–5.11)
RDW: 13.2 % (ref 11.5–15.5)
WBC: 16.1 10*3/uL — ABNORMAL HIGH (ref 4.0–10.5)

## 2017-11-17 LAB — COMPREHENSIVE METABOLIC PANEL
ALBUMIN: 3.1 g/dL — AB (ref 3.5–5.0)
ALK PHOS: 123 U/L (ref 38–126)
ALT: 5 U/L (ref 0–44)
AST: 15 U/L (ref 15–41)
Anion gap: 12 (ref 5–15)
BUN: 6 mg/dL (ref 6–20)
CALCIUM: 9 mg/dL (ref 8.9–10.3)
CO2: 27 mmol/L (ref 22–32)
CREATININE: 0.72 mg/dL (ref 0.44–1.00)
Chloride: 93 mmol/L — ABNORMAL LOW (ref 98–111)
GFR calc non Af Amer: >60 mL/min (ref >60)
GLUCOSE: 177 mg/dL — AB (ref 70–99)
Potassium: 3 mmol/L — ABNORMAL LOW (ref 3.5–5.1)
Sodium: 132 mmol/L — ABNORMAL LOW (ref 135–145)
Total Bilirubin: 0.7 mg/dL (ref 0.3–1.2)
Total Protein: 7.9 g/dL (ref 6.5–8.1)

## 2017-11-17 LAB — PREGNANCY, URINE: PREG TEST UR: NEGATIVE

## 2017-11-17 MED ORDER — FENTANYL CITRATE (PF) 100 MCG/2ML IJ SOLN
50.0000 ug | Freq: Once | INTRAMUSCULAR | Status: AC
  Administered 2017-11-17: 50 ug via INTRAVENOUS
  Filled 2017-11-17: qty 2

## 2017-11-17 MED ORDER — SULFAMETHOXAZOLE-TRIMETHOPRIM 800-160 MG PO TABS: 1.0000 | ORAL_TABLET | Freq: Two times a day (BID) | ORAL | 0 refills | Status: AC

## 2017-11-17 MED ORDER — SODIUM CHLORIDE 0.9 % IV SOLN
1.0000 g | Freq: Once | INTRAVENOUS | Status: AC
  Administered 2017-11-17: 1 g via INTRAVENOUS
  Filled 2017-11-17: qty 10

## 2017-11-17 MED ORDER — HYDROMORPHONE HCL 1 MG/ML IJ SOLN
1.0000 mg | Freq: Once | INTRAMUSCULAR | Status: AC
Start: 2017-11-17 — End: 2017-11-17
  Administered 2017-11-17: 1 mg via INTRAVENOUS
  Filled 2017-11-17: qty 1

## 2017-11-17 MED ORDER — HYDROMORPHONE HCL 1 MG/ML IJ SOLN
1.0000 mg | Freq: Once | INTRAMUSCULAR | Status: AC
  Administered 2017-11-17: 1 mg via INTRAVENOUS
  Filled 2017-11-17: qty 1

## 2017-11-17 MED ORDER — KETOROLAC TROMETHAMINE 10 MG PO TABS: 10.0000 mg | ORAL_TABLET | Freq: Four times a day (QID) | ORAL | 0 refills | Status: DC | PRN

## 2017-11-17 MED ORDER — SODIUM CHLORIDE 0.9 % IV BOLUS
1000.0000 mL | Freq: Once | INTRAVENOUS | Status: AC
  Administered 2017-11-17: 1000 mL via INTRAVENOUS

## 2017-11-17 MED ORDER — ONDANSETRON HCL 4 MG/2ML IJ SOLN
4.0000 mg | Freq: Once | INTRAMUSCULAR | Status: AC
  Administered 2017-11-17: 4 mg via INTRAVENOUS
  Filled 2017-11-17: qty 2

## 2017-11-17 MED ORDER — PHENAZOPYRIDINE HCL 200 MG PO TABS: 200.0000 mg | ORAL_TABLET | Freq: Three times a day (TID) | ORAL | 0 refills | Status: DC

## 2017-11-17 NOTE — ED Notes (Signed)
Pt stated "I go to the methadone clinic.  I took 4 roxy from a friend but that didn't even help.

## 2017-11-17 NOTE — ED Provider Notes (Signed)
[facility] DEPT Provider Note   CSN: [id number] Arrival date & time: 11/17/17  0604     History   Chief Complaint Chief Complaint  Patient presents with  . Back Pain    HPI Kelly [name] is a 32 y.o. female.  Pt presents to the ED today with severe low back pain.  She does have a hx of chronic back pain due to scoliosis and Harrington Rods.  She is also a heroin addict and goes to the methadone clinic.  She said her back does not usually hurt this severely and the last time was when she had a kidney infection.  The pt denies f/c.  She did take 4 roxys from a friend which did not help.     Past Medical History:  Diagnosis Date  . Abscess of breast    rt breast  . ADD (attention deficit disorder)   . Back pain   . Bipolar 1 disorder ([place])   . Hypertension   . Polysubstance abuse ([place])   . Suicidal overdose [name])     Patient Active Problem List   Diagnosis Date Noted  . Facial cellulitis 08/29/2016  . Bipolar disorder ([place]) 07/31/2016  . Major depressive disorder, recurrent episode with anxious distress ([place]) 07/29/2016  . Normal labor 07/26/2016  . Foreign body granuloma of soft tissue, NEC, right upper arm   . Pyelonephritis 05/28/2016  . Polysubstance abuse ([place]) 05/25/2016  . No prenatal care in current pregnancy 05/25/2016  . Chronic hypertension affecting pregnancy 05/25/2016  . History of cesarean delivery affecting pregnancy 05/25/2016  . Community acquired pneumonia 05/25/2016  . Pyelonephritis affecting pregnancy 05/24/2016  . Palliative care encounter   . Abscess of right breast 07/13/2014  . Opioid use disorder, severe, dependence ([place]) 08/25/2013  . Unspecified episodic mood disorder 08/25/2013  . ADHD (attention deficit hyperactivity disorder) 08/25/2013  . Cocaine abuse ([place]) 08/25/2013  . Thoracic back pain 01/29/2013  . CROHN'S DISEASE 05/03/2005  . COLONIC POLYPS 02/28/2003    Past Surgical History:    Procedure Laterality Date  . BACK SURGERY     scoliosis-in care everywhere 08/1999  . CESAREAN SECTION    . CHOLECYSTECTOMY  March 2015  . FOREIGN BODY REMOVAL Right 05/28/2016   Procedure: REMOVAL FOREIGN BODY RIGHT ARM;  Surgeon: Meredith Pel, MD;  Location: [facility];  Service: Orthopedics;  Laterality: Right;  . IRRIGATION AND DEBRIDEMENT ABSCESS Right 07/13/2014   Procedure: IRRIGATION AND DEBRIDEMEN TRIGHT BREAST ABSCESS;  Surgeon: Donnie Mesa, MD;  Location: [facility];  Service: General;  Laterality: Right;     OB History    Gravida  5   Para  1   Term  1   Preterm      AB  3   Living  1     SAB  3   TAB      Ectopic      Multiple      Live Births  1            Home Medications    Prior to Admission medications   Medication Sig Start Date End Date Taking? Authorizing Provider  methadone (DOLOPHINE) 10 MG/5ML solution Take 100 mg by mouth daily.   Yes [provider]  buPROPion (WELLBUTRIN XL) 150 MG 24 hr tablet Take 1 tablet (150 mg total) by mouth daily. Patient not taking: Reported on 04/16/2017 08/04/16   Kerrie Buffalo, NP  gabapentin (NEURONTIN) 300 MG capsule Take 1 capsule (  300 mg total) by mouth 3 (three) times daily. Patient not taking: Reported on 04/16/2017 08/03/16   Kerrie Buffalo, NP  hydrOXYzine (ATARAX/VISTARIL) 50 MG tablet Take 1 tablet (50 mg total) by mouth every 6 (six) hours as needed for anxiety. Patient not taking: Reported on 04/16/2017 08/03/16   Kerrie Buffalo, NP  ketorolac (TORADOL) 10 MG tablet Take 1 tablet (10 mg total) by mouth every 6 (six) hours as needed. 11/17/17   Isla Pence, MD  nicotine (NICODERM CQ - DOSED IN MG/24 HOURS) 21 mg/24hr patch Place 1 patch (21 mg total) onto the skin daily. Patient not taking: Reported on 04/16/2017 08/04/16   Kerrie Buffalo, NP  phenazopyridine (PYRIDIUM) 200 MG tablet Take 1 tablet (200 mg total) by mouth 3 (three) times daily. 11/17/17   Isla Pence, MD   sulfamethoxazole-trimethoprim (BACTRIM DS,SEPTRA DS) 800-160 MG tablet Take 1 tablet by mouth 2 (two) times daily for 7 days. 11/17/17 11/24/17  Isla Pence, MD  traZODone (DESYREL) 50 MG tablet Take 1 tablet (50 mg total) by mouth at bedtime as needed for sleep. Patient not taking: Reported on 04/16/2017 08/03/16   Kerrie Buffalo, NP  fluticasone [facility]) 50 MCG/ACT nasal spray Place 1 spray into both nostrils daily. For allergies Patient not taking: Reported on 07/13/2014 09/01/13 02/15/15  Lindell Spar I, NP  loratadine (CLARITIN) 10 MG tablet Take 1 tablet (10 mg total) by mouth daily as needed for allergies. (May purchase this medicine from over the counter at yr local pharmacy): For allergies Patient not taking: Reported on 07/13/2014 09/01/13 02/15/15  [name], NP    Family History Family History  Problem Relation Age of Onset  . Diabetes Mother   . Diabetes Father   . Hypertension Father   . Heart attack Neg Hx   . Hyperlipidemia Neg Hx   . Sudden death Neg Hx     Social History Social History   Tobacco Use  . Smoking status: Current Every Day Smoker    Packs/day: 1.00    Years: 13.00    Pack years: 13.00    Types: Cigarettes  . Smokeless tobacco: Never Used  Substance Use Topics  . Alcohol use: Yes    Comment: one or two drinks weekly  . Drug use: Yes    Types: Cocaine, Heroin, IV    Comment: heroin, crack cocaine     Allergies   Hydrocodone and Morphine and related   Review of Systems Review of Systems  Musculoskeletal: Positive for back pain.  All other systems reviewed and are negative.    Physical Exam Updated Vital Signs BP 133/82 (BP Location: Left Arm)   Pulse 95   Temp 98.3 F (36.8 C) (Oral)   Resp 18   Ht 5' 7"  (1.702 m)   Wt 72.6 kg (160 lb)   LMP 11/11/2017 (Approximate)   SpO2 98%   BMI 25.06 kg/m   Physical Exam  Constitutional: She is oriented to person, place, and time. She appears well-developed and well-nourished.  HENT:   Head: Normocephalic and atraumatic.  Right Ear: External ear normal.  Left Ear: External ear normal.  Nose: Nose normal.  Mouth/Throat: Mucous membranes are dry.  Eyes: Pupils are equal, round, and reactive to light. Conjunctivae and EOM are normal.  Neck: Normal range of motion. Neck supple.  Cardiovascular: Regular rhythm, normal heart sounds and intact distal pulses. Tachycardia present.  Pulmonary/Chest: Effort normal and breath sounds normal.  Abdominal: Soft. Bowel sounds are normal. There is tenderness in  the suprapubic area.  Musculoskeletal:       Back:  Large midline back scar noted  Neurological: She is alert and oriented to person, place, and time.  Skin: Skin is warm. Capillary refill takes less than 2 seconds.  Psychiatric: Her behavior is normal. Judgment and thought content normal. Her mood appears anxious.  Nursing note and vitals reviewed.    ED Treatments / Results  Labs (all labs ordered are listed, but only abnormal results are displayed) Labs Reviewed  COMPREHENSIVE METABOLIC PANEL - Abnormal; Notable for the following components:      Result Value   Sodium 132 (*)    Potassium 3.0 (*)    Chloride 93 (*)    Glucose, Bld 177 (*)    Albumin 3.1 (*)    All other components within normal limits  CBC WITH DIFFERENTIAL/PLATELET - Abnormal; Notable for the following components:   WBC 16.1 (*)    Neutro Abs 14.0 (*)    Monocytes Absolute 1.3 (*)    All other components within normal limits  URINALYSIS, ROUTINE W REFLEX MICROSCOPIC - Abnormal; Notable for the following components:   Color, Urine AMBER (*)    APPearance HAZY (*)    Hgb urine dipstick MODERATE (*)    Ketones, ur 5 (*)    Protein, ur 100 (*)    Nitrite POSITIVE (*)    Leukocytes, UA MODERATE (*)    WBC, UA >50 (*)    Bacteria, UA MANY (*)    All other components within normal limits  PREGNANCY, URINE    EKG None  Radiology Ct Renal Stone Study  Result Date: 11/17/2017 CLINICAL  DATA:  Back pain for 1 week. Urinary tract infection diagnose today. EXAM: CT ABDOMEN AND PELVIS WITHOUT CONTRAST TECHNIQUE: Multidetector CT imaging of the abdomen and pelvis was performed following the standard protocol without IV contrast. COMPARISON:  CT abdomen and pelvis 02/15/2015. FINDINGS: Lower chest: Patchy airspace disease is seen in the lower lobes is greater on the left. Heart size is normal. No pneumothorax or pleural effusion. Hepatobiliary: No focal lesion. The liver is low attenuating consistent with fatty infiltration and measures 20 cm craniocaudal. The patient is status post cholecystectomy. Pancreas: Unremarkable. No pancreatic ductal dilatation or surrounding inflammatory changes. Spleen: Normal in size without focal abnormality. Adrenals/Urinary Tract: A nonobstructing stone in midpole the right kidney measures 1 cm in diameter. No other urinary tract stones are identified. Negative for hydronephrosis. Urinary bladder appears normal. Adrenal glands somewhat poorly seen due to streak artifact from spinal fusion hardware but no obvious abnormality is identified. Stomach/Bowel: Stomach is within normal limits. Appendix appears normal. No evidence of bowel wall thickening, distention, or inflammatory changes. Vascular/Lymphatic: No significant vascular findings are present. No enlarged abdominal or pelvic lymph nodes. Reproductive: Uterus and bilateral adnexa are unremarkable. Other: No fluid collection. Musculoskeletal: No acute or focal abnormality. Thoracolumbar fusion hardware for convex left scoliosis is noted. IMPRESSION: No acute finding abdomen or pelvis. Patchy bibasilar airspace disease is greater on the left and likely due to atelectasis although it could represent pneumonia. 1 cm nonobstructing right renal stone. Fatty infiltration of the liver and hepatomegaly. Electronically Signed   By: Inge Rise M.D.   On: 11/17/2017 10:17    Procedures Procedures (including critical  care time)  Medications Ordered in ED Medications  sodium chloride 0.9 % bolus 1,000 mL (0 mLs Intravenous Stopped 11/17/17 0950)  ondansetron (ZOFRAN) injection 4 mg (4 mg Intravenous Given 11/17/17 0739)  fentaNYL (SUBLIMAZE)  injection 50 mcg (50 mcg Intravenous Given 11/17/17 0739)  HYDROmorphone (DILAUDID) injection 1 mg (1 mg Intravenous Given 11/17/17 0846)  HYDROmorphone (DILAUDID) injection 1 mg (1 mg Intravenous Given 11/17/17 0946)  cefTRIAXone (ROCEPHIN) 1 g in sodium chloride 0.9 % 100 mL IVPB (0 g Intravenous Stopped 11/17/17 1031)     Initial Impression / Assessment and Plan / ED Course  I have reviewed the triage vital signs and the nursing notes.  Pertinent labs & imaging results that were available during my care of the patient were reviewed by me and considered in my medical decision making (see chart for details).     Pt's back is still hurting, but no kidney stone or pyelo.  I suspect we will never get pt's pain completely under control.  She has a hx of opiate and heroin abuse, so she will not be d/c home with opiates.  The pt is encouraged to return if worse and to f/u with pcp.  Final Clinical Impressions(s) / ED Diagnoses   Final diagnoses:  Acute bilateral low back pain without sciatica  Acute cystitis with hematuria    ED Discharge Orders        Ordered    sulfamethoxazole-trimethoprim (BACTRIM DS,SEPTRA DS) 800-160 MG tablet  2 times daily     11/17/17 1041    phenazopyridine (PYRIDIUM) 200 MG tablet  3 times daily     11/17/17 1041    ketorolac (TORADOL) 10 MG tablet  Every 6 hours PRN     11/17/17 1041       Isla Pence, MD 11/17/17 1045

## 2017-11-17 NOTE — ED Triage Notes (Signed)
Pt c/o back pain x 1 week,  Pt stated "It moves up and down, I can't take deep breaths."

## 2017-11-21 ENCOUNTER — Encounter (HOSPITAL_COMMUNITY): Payer: Self-pay | Admitting: Emergency Medicine

## 2017-11-21 ENCOUNTER — Emergency Department (HOSPITAL_COMMUNITY): Payer: Medicaid Other

## 2017-11-21 ENCOUNTER — Other Ambulatory Visit: Payer: Self-pay

## 2017-11-21 ENCOUNTER — Emergency Department (HOSPITAL_COMMUNITY)
Admission: EM | Admit: 2017-11-21 | Discharge: 2017-11-21 | Disposition: A | Discharge status: Home / Self Care | Payer: Medicaid Other | Attending: Emergency Medicine | Admitting: Emergency Medicine

## 2017-11-21 DIAGNOSIS — Z79899 Other long term (current) drug therapy: Secondary | ICD-10-CM | POA: Insufficient documentation

## 2017-11-21 DIAGNOSIS — M5489 Other dorsalgia: Secondary | ICD-10-CM | POA: Diagnosis present

## 2017-11-21 DIAGNOSIS — I10 Essential (primary) hypertension: Secondary | ICD-10-CM | POA: Insufficient documentation

## 2017-11-21 DIAGNOSIS — M545 Low back pain, unspecified: Secondary | ICD-10-CM

## 2017-11-21 DIAGNOSIS — F17210 Nicotine dependence, cigarettes, uncomplicated: Secondary | ICD-10-CM | POA: Insufficient documentation

## 2017-11-21 DIAGNOSIS — E876 Hypokalemia: Secondary | ICD-10-CM

## 2017-11-21 LAB — COMPREHENSIVE METABOLIC PANEL
ALK PHOS: 133 U/L — AB (ref 38–126)
ANION GAP: 11 (ref 5–15)
AST: 9 U/L — ABNORMAL LOW (ref 15–41)
Albumin: 2.5 g/dL — ABNORMAL LOW (ref 3.5–5.0)
BILIRUBIN TOTAL: 0.4 mg/dL (ref 0.3–1.2)
BUN: 6 mg/dL (ref 6–20)
CO2: 29 mmol/L (ref 22–32)
Calcium: 9 mg/dL (ref 8.9–10.3)
Chloride: 93 mmol/L — ABNORMAL LOW (ref 98–111)
Creatinine, Ser: 0.57 mg/dL (ref 0.44–1.00)
GFR calc non Af Amer: >60 mL/min (ref >60)
Glucose, Bld: 105 mg/dL — ABNORMAL HIGH (ref 70–99)
Potassium: 3 mmol/L — ABNORMAL LOW (ref 3.5–5.1)
SODIUM: 133 mmol/L — AB (ref 135–145)
TOTAL PROTEIN: 6.8 g/dL (ref 6.5–8.1)

## 2017-11-21 LAB — CBC WITH DIFFERENTIAL/PLATELET
Abs Immature Granulocytes: 0.1 10*3/uL (ref 0.0–0.1)
BASOS ABS: 0.1 10*3/uL (ref 0.0–0.1)
Basophils Relative: 0 %
EOS PCT: 1 %
Eosinophils Absolute: 0.1 10*3/uL (ref 0.0–0.7)
HCT: 36.7 % (ref 36.0–46.0)
HEMOGLOBIN: 11.8 g/dL — AB (ref 12.0–15.0)
Immature Granulocytes: 1 %
LYMPHS PCT: 17 %
Lymphs Abs: 2 10*3/uL (ref 0.7–4.0)
MCH: 29.3 pg (ref 26.0–34.0)
MCHC: 32.2 g/dL (ref 30.0–36.0)
MCV: 91.1 fL (ref 78.0–100.0)
Monocytes Absolute: 1 10*3/uL (ref 0.1–1.0)
Monocytes Relative: 8 %
NEUTROS ABS: 8.9 10*3/uL — AB (ref 1.7–7.7)
NEUTROS PCT: 73 %
Platelets: 439 10*3/uL — ABNORMAL HIGH (ref 150–400)
RBC: 4.03 MIL/uL (ref 3.87–5.11)
RDW: 12.8 % (ref 11.5–15.5)
WBC: 12.2 10*3/uL — AB (ref 4.0–10.5)

## 2017-11-21 LAB — URINALYSIS, ROUTINE W REFLEX MICROSCOPIC
Glucose, UA: NEGATIVE mg/dL
Hgb urine dipstick: NEGATIVE
Ketones, ur: NEGATIVE mg/dL
Nitrite: NEGATIVE
PH: 5 (ref 5.0–8.0)
Protein, ur: 30 mg/dL — AB
SPECIFIC GRAVITY, URINE: 1.027 (ref 1.005–1.030)

## 2017-11-21 LAB — RAPID URINE DRUG SCREEN, HOSP PERFORMED
AMPHETAMINES: POSITIVE — AB
BENZODIAZEPINES: POSITIVE — AB
COCAINE: POSITIVE — AB
OPIATES: POSITIVE — AB
TETRAHYDROCANNABINOL: NOT DETECTED

## 2017-11-21 LAB — POC URINE PREG, ED: PREG TEST UR: NEGATIVE

## 2017-11-21 LAB — ETHANOL: Alcohol, Ethyl (B): <10 mg/dL (ref <10)

## 2017-11-21 MED ORDER — GADOBENATE DIMEGLUMINE 529 MG/ML IV SOLN
20.0000 mL | Freq: Once | INTRAVENOUS | Status: AC
  Administered 2017-11-21: 15 mL via INTRAVENOUS

## 2017-11-21 MED ORDER — KETOROLAC TROMETHAMINE 15 MG/ML IJ SOLN
15.0000 mg | Freq: Once | INTRAMUSCULAR | Status: DC
  Filled 2017-11-21: qty 1

## 2017-11-21 MED ORDER — DIPHENHYDRAMINE HCL 50 MG/ML IJ SOLN: 50.0000 mg | Freq: Once | INTRAMUSCULAR | Status: DC

## 2017-11-21 MED ORDER — DIPHENHYDRAMINE HCL 50 MG/ML IJ SOLN
50.0000 mg | Freq: Once | INTRAMUSCULAR | Status: AC
  Administered 2017-11-21: 50 mg via INTRAVENOUS

## 2017-11-21 MED ORDER — KETOROLAC TROMETHAMINE 15 MG/ML IJ SOLN
15.0000 mg | Freq: Once | INTRAMUSCULAR | Status: AC
  Administered 2017-11-21: 15 mg via INTRAVENOUS

## 2017-11-21 MED ORDER — DIPHENHYDRAMINE HCL 50 MG/ML IJ SOLN
50.0000 mg | Freq: Once | INTRAMUSCULAR | Status: DC
  Filled 2017-11-21: qty 1

## 2017-11-21 MED ORDER — KETOROLAC TROMETHAMINE 60 MG/2ML IM SOLN: 15.0000 mg | Freq: Once | INTRAMUSCULAR | Status: DC

## 2017-11-21 MED ORDER — AZITHROMYCIN 250 MG PO TABS: ORAL_TABLET | ORAL | 0 refills | Status: DC

## 2017-11-21 MED ORDER — POTASSIUM CHLORIDE CRYS ER 20 MEQ PO TBCR
20.0000 meq | EXTENDED_RELEASE_TABLET | Freq: Once | ORAL | Status: AC
  Administered 2017-11-21: 20 meq via ORAL
  Filled 2017-11-21: qty 1

## 2017-11-21 NOTE — ED Provider Notes (Addendum)
[place] EMERGENCY DEPARTMENT Provider Note   CSN: [id number] Arrival date & time: 11/21/17  0544     History   Chief Complaint Chief Complaint  Patient presents with  . Back Pain    HPI Kelly [name] is a 32 y.o. female.  32 year old female with prior history significant for chronic back pain,scoliosis, and IV drug abuse (Heroin) presents with complaint of mid-thoracic and low back pain.  Patient reports increasing back pain over the last week.  She was recently seen in the [facility] ED system and diagnosed with a UTI.  She returns today complaining of increasing pain now associated with lower extremity weakness.  She reports that both legs feel weak.  She denies bowel or bladder incontinence.  She does report temperatures at home up to 102.  She reports that she used heroin (IV) yesterday in order to control her pain.  She has not been to her methadone clinic in the last 24 hours.   Back Pain   This is a recurrent problem. The current episode started more than 1 week ago. The problem occurs constantly. The problem has been rapidly worsening. The pain is associated with no known injury. The pain is present in the thoracic spine and lumbar spine. The quality of the pain is described as shooting and aching. The pain is moderate. Associated symptoms include a fever and weakness. Pertinent negatives include no headaches.    Past Medical History:  Diagnosis Date  . Abscess of breast    rt breast  . ADD (attention deficit disorder)   . Back pain   . Bipolar 1 disorder ([place])   . Hypertension   . Polysubstance abuse ([place])   . Suicidal overdose [facility])     Patient Active Problem List   Diagnosis Date Noted  . Facial cellulitis 08/29/2016  . Bipolar disorder ([place]) 07/31/2016  . Major depressive disorder, recurrent episode with anxious distress ([place]) 07/29/2016  . Normal labor 07/26/2016  . Foreign body granuloma of soft tissue, NEC, right upper arm   .  Pyelonephritis 05/28/2016  . Polysubstance abuse ([place]) 05/25/2016  . No prenatal care in current pregnancy 05/25/2016  . Chronic hypertension affecting pregnancy 05/25/2016  . History of cesarean delivery affecting pregnancy 05/25/2016  . Community acquired pneumonia 05/25/2016  . Pyelonephritis affecting pregnancy 05/24/2016  . Palliative care encounter   . Abscess of right breast 07/13/2014  . Opioid use disorder, severe, dependence ([place]) 08/25/2013  . Unspecified episodic mood disorder 08/25/2013  . ADHD (attention deficit hyperactivity disorder) 08/25/2013  . Cocaine abuse ([place]) 08/25/2013  . Thoracic back pain 01/29/2013  . CROHN'S DISEASE 05/03/2005  . COLONIC POLYPS 02/28/2003    Past Surgical History:  Procedure Laterality Date  . BACK SURGERY     scoliosis-in care everywhere 08/1999  . CESAREAN SECTION    . CHOLECYSTECTOMY  March 2015  . FOREIGN BODY REMOVAL Right 05/28/2016   Procedure: REMOVAL FOREIGN BODY RIGHT ARM;  Surgeon: Meredith Pel, MD;  Location: [place];  Service: Orthopedics;  Laterality: Right;  . IRRIGATION AND DEBRIDEMENT ABSCESS Right 07/13/2014   Procedure: IRRIGATION AND DEBRIDEMEN TRIGHT BREAST ABSCESS;  Surgeon: Donnie Mesa, MD;  Location: [place];  Service: General;  Laterality: Right;     OB History    Gravida  5   Para  1   Term  1   Preterm      AB  3   Living  1     SAB  3   TAB      Ectopic      Multiple      Live Births  1            Home Medications    Prior to Admission medications   Medication Sig Start Date End Date Taking? Authorizing Provider  buPROPion (WELLBUTRIN XL) 150 MG 24 hr tablet Take 1 tablet (150 mg total) by mouth daily. Patient not taking: Reported on 04/16/2017 08/04/16   Kerrie Buffalo, NP  gabapentin (NEURONTIN) 300 MG capsule Take 1 capsule (300 mg total) by mouth 3 (three) times daily. Patient not taking: Reported on 04/16/2017 08/03/16   Kerrie Buffalo, NP  hydrOXYzine (ATARAX/VISTARIL) 50  MG tablet Take 1 tablet (50 mg total) by mouth every 6 (six) hours as needed for anxiety. Patient not taking: Reported on 04/16/2017 08/03/16   Kerrie Buffalo, NP  ketorolac (TORADOL) 10 MG tablet Take 1 tablet (10 mg total) by mouth every 6 (six) hours as needed. 11/17/17   Isla Pence, MD  methadone (DOLOPHINE) 10 MG/5ML solution Take 100 mg by mouth daily.    [provider]  nicotine (NICODERM CQ - DOSED IN MG/24 HOURS) 21 mg/24hr patch Place 1 patch (21 mg total) onto the skin daily. Patient not taking: Reported on 04/16/2017 08/04/16   Kerrie Buffalo, NP  phenazopyridine (PYRIDIUM) 200 MG tablet Take 1 tablet (200 mg total) by mouth 3 (three) times daily. 11/17/17   Isla Pence, MD  sulfamethoxazole-trimethoprim (BACTRIM DS,SEPTRA DS) 800-160 MG tablet Take 1 tablet by mouth 2 (two) times daily for 7 days. 11/17/17 11/24/17  Isla Pence, MD  traZODone (DESYREL) 50 MG tablet Take 1 tablet (50 mg total) by mouth at bedtime as needed for sleep. Patient not taking: Reported on 04/16/2017 08/03/16   Kerrie Buffalo, NP    Family History Family History  Problem Relation Age of Onset  . Diabetes Mother   . Diabetes Father   . Hypertension Father   . Heart attack Neg Hx   . Hyperlipidemia Neg Hx   . Sudden death Neg Hx     Social History Social History   Tobacco Use  . Smoking status: Current Every Day Smoker    Packs/day: 1.00    Years: 13.00    Pack years: 13.00    Types: Cigarettes  . Smokeless tobacco: Never Used  Substance Use Topics  . Alcohol use: Yes    Comment: one or two drinks weekly  . Drug use: Yes    Types: Cocaine, Heroin, IV    Comment: heroin, crack cocaine     Allergies   Hydrocodone and Morphine and related   Review of Systems Review of Systems  Constitutional: Positive for fever.  Musculoskeletal: Positive for back pain.  Neurological: Positive for weakness. Negative for headaches.  All other systems reviewed and are  negative.    Physical Exam Updated Vital Signs BP (!) 138/93   Pulse (!) 106   Temp 98.5 F (36.9 C) (Oral)   Resp 19   LMP 11/11/2017 (Approximate)   SpO2 99%   Physical Exam  Constitutional: She is oriented to person, place, and time. She appears well-developed and well-nourished. She appears distressed.  Tearful and distraught   HENT:  Head: Normocephalic and atraumatic.  Mouth/Throat: Oropharynx is clear and moist.  Eyes: Pupils are equal, round, and reactive to light. Conjunctivae and EOM are normal.  Neck: Normal range of motion. Neck supple.  Cardiovascular: Normal rate, regular rhythm and normal heart  sounds.  No murmur heard. Pulmonary/Chest: Effort normal and breath sounds normal. No respiratory distress.  Abdominal: Soft. She exhibits no distension. There is no tenderness.  Musculoskeletal: Normal range of motion. She exhibits no edema or deformity.  Neurological: She is alert and oriented to person, place, and time.  AOX4 Normal Speech  No facial droop 5/5 strength to BUE  DTR's intact in BLE Patient is dressed - with pants and flip-flops on at time of exam BLE appear weak on exam, however, patient's weakness seems to resolve with distraction - uncertain if patient is cooperating with exam   Skin: Skin is warm and dry.  Psychiatric: She has a normal mood and affect.  Nursing note and vitals reviewed.    ED Treatments / Results  Labs (all labs ordered are listed, but only abnormal results are displayed) Labs Reviewed  URINALYSIS, ROUTINE W REFLEX MICROSCOPIC - Abnormal; Notable for the following components:      Result Value   Color, Urine AMBER (*)    APPearance HAZY (*)    Bilirubin Urine SMALL (*)    Protein, ur 30 (*)    Leukocytes, UA SMALL (*)    Bacteria, UA RARE (*)    All other components within normal limits  RAPID URINE DRUG SCREEN, HOSP PERFORMED - Abnormal; Notable for the following components:   Opiates POSITIVE (*)    Cocaine POSITIVE  (*)    Benzodiazepines POSITIVE (*)    Amphetamines POSITIVE (*)    Barbiturates   (*)    Value: Result not available. Reagent lot number recalled by manufacturer.   All other components within normal limits  COMPREHENSIVE METABOLIC PANEL - Abnormal; Notable for the following components:   Sodium 133 (*)    Potassium 3.0 (*)    Chloride 93 (*)    Glucose, Bld 105 (*)    Albumin 2.5 (*)    AST 9 (*)    Alkaline Phosphatase 133 (*)    All other components within normal limits  CBC WITH DIFFERENTIAL/PLATELET - Abnormal; Notable for the following components:   WBC 12.2 (*)    Hemoglobin 11.8 (*)    Platelets 439 (*)    Neutro Abs 8.9 (*)    All other components within normal limits  ETHANOL  CBC WITH DIFFERENTIAL/PLATELET  POC URINE PREG, ED    EKG EKG Interpretation  Date/Time:  Thursday November 21 2017 07:45:04 EDT Ventricular Rate:  107 PR Interval:    QRS Duration: 91 QT Interval:  359 QTC Calculation: 479 R Axis:   67 Text Interpretation:  Sinus tachycardia Nonspecific T abnormalities, diffuse leads Borderline prolonged QT interval Baseline wander in lead(s) V6 Confirmed by Dene Gentry [phone]) on 11/21/2017 7:53:36 AM Also confirmed by Dene Gentry [phone]), editor [place], Jeannetta Nap [phone])  on 11/21/2017 8:06:52 AM   Radiology Dg Chest 2 View  Result Date: 11/21/2017 CLINICAL DATA:  Chest and back region pain EXAM: CHEST - 2 VIEW COMPARISON:  July 31, 2016 FINDINGS: There is patchy infiltrate in the posterior left base. The lungs elsewhere are clear. Heart size and pulmonary vascularity are normal. No adenopathy. Rod fixation noted in the visualized thoracic and upper lumbar regions, unchanged. IMPRESSION: Infiltrate posterior left base, felt to represent focal pneumonia. Lungs elsewhere clear. Stable cardiac silhouette. Postoperative change in the spine region. Electronically Signed   By: Lowella Grip III M.D.   On: 11/21/2017 14:42   Mr Thoracic Spine W Wo  Contrast  Result Date: 11/21/2017 CLINICAL DATA:  Chronic  back pain getting worse. Pain in the middle of the back. EXAM: MRI THORACIC AND LUMBAR SPINE WITHOUT AND WITH CONTRAST TECHNIQUE: Multiplanar and multiecho pulse sequences of the thoracic and lumbar spine were obtained without and with intravenous contrast. CONTRAST:  5m MULTIHANCE GADOBENATE DIMEGLUMINE 529 MG/ML IV SOLN COMPARISON:  CT chest 05/24/2016 FINDINGS: MRI THORACIC SPINE FINDINGS Alignment: No static listhesis. Dextroscoliosis of the thoracic spine. Vertebrae: Posterior spinal fixation rods are noted transfixing the thoracolumbar spine with severe susceptibility artifact obscuring large portions of the posterior elements and central canal. No fracture, evidence of discitis, or bone lesion. Cord:  Normal signal and morphology. Paraspinal and other soft tissues: No acute paraspinal abnormality. 3 x 1.5 cm left upper lobe mass of indeterminate etiology. Left pleural thickening. Disc levels: Disc spaces:  Disc spaces are maintained. T1-T2: No disc protrusion, foraminal stenosis or central canal stenosis. T2-T3: Small shallow right paracentral disc protrusion. No foraminal or central canal stenosis. T3-T4: No disc protrusion, foraminal stenosis or central canal stenosis. T4-T5: No disc protrusion, foraminal stenosis or central canal stenosis. T5-T6: No disc protrusion, foraminal stenosis or central canal stenosis. T6-T7: No disc protrusion, foraminal stenosis or central canal stenosis. T7-T8: No disc protrusion, foraminal stenosis or central canal stenosis. T8-T9: No disc protrusion, foraminal stenosis or central canal stenosis. T9-T10: No disc protrusion, foraminal stenosis or central canal stenosis. T10-T11: No disc protrusion or foraminal stenosis. Central canal is obscured by susceptibility artifact. T11-T12: No disc protrusion or foraminal stenosis. Central canal is obscured by susceptibility artifact. MRI LUMBAR SPINE FINDINGS Segmentation:   Standard. Alignment:  No static listhesis.  Levoscoliosis of the lumbar spine. Vertebrae: Posterior spinal fixation rods are noted transfixing the thoracolumbar spine with susceptibility artifact partially obscuring portions of the posterior elements and central canal. No fracture, evidence of discitis, or bone lesion. Conus medullaris: Extends to the T12 level and appears normal. Paraspinal and other soft tissues: No acute paraspinal abnormality. Postsurgical changes in the posterior paraspinal soft tissues. Disc levels: Disc spaces: Disc spaces are relatively well maintained. T12-L1: No significant disc bulge. No evidence of neural foraminal stenosis. No central canal stenosis. L1-L2: No significant disc bulge. No right foraminal stenosis. Left neural foramen is obscured by susceptibility artifact. No central canal stenosis. L2-L3: No significant disc bulge. Neural foramina are obscured by susceptibility artifact. No central canal stenosis. L3-L4: Minimal broad-based disc bulge. Moderate bilateral facet arthropathy. Mild right and moderate left foraminal stenosis. No central canal stenosis. L4-L5: Broad-based disc bulge. Moderate bilateral facet arthropathy. Bilateral lateral recess stenosis, left greater than right. No right foraminal stenosis. Mild left foraminal stenosis. No central canal stenosis. L5-S1: Broad-based disc bulge. Moderate bilateral facet arthropathy, left greater than right. Moderate left foraminal stenosis. No right foraminal stenosis. No central canal stenosis. IMPRESSION: 1. Posterior spinal fixation rods are noted transfixing the thoracolumbar spine with susceptibility artifact partially obscuring portions of the posterior elements and central canal. 2. Mild lumbar spine spondylosis as described above. 3. No acute injury of the thoracolumbar spine. 4. 3 x 1.5 cm left upper lobe mass of indeterminate etiology. Differential considerations include pneumonia versus pulmonary neoplasm. Further  evaluation with a CT of the chest is recommended. Electronically Signed   By: HKathreen Devoid  On: 11/21/2017 13:03   Mr Lumbar Spine W Wo Contrast  Result Date: 11/21/2017 CLINICAL DATA:  Chronic back pain getting worse. Pain in the middle of the back. EXAM: MRI THORACIC AND LUMBAR SPINE WITHOUT AND WITH CONTRAST TECHNIQUE: Multiplanar and multiecho pulse sequences  of the thoracic and lumbar spine were obtained without and with intravenous contrast. CONTRAST:  56m MULTIHANCE GADOBENATE DIMEGLUMINE 529 MG/ML IV SOLN COMPARISON:  CT chest 05/24/2016 FINDINGS: MRI THORACIC SPINE FINDINGS Alignment: No static listhesis. Dextroscoliosis of the thoracic spine. Vertebrae: Posterior spinal fixation rods are noted transfixing the thoracolumbar spine with severe susceptibility artifact obscuring large portions of the posterior elements and central canal. No fracture, evidence of discitis, or bone lesion. Cord:  Normal signal and morphology. Paraspinal and other soft tissues: No acute paraspinal abnormality. 3 x 1.5 cm left upper lobe mass of indeterminate etiology. Left pleural thickening. Disc levels: Disc spaces:  Disc spaces are maintained. T1-T2: No disc protrusion, foraminal stenosis or central canal stenosis. T2-T3: Small shallow right paracentral disc protrusion. No foraminal or central canal stenosis. T3-T4: No disc protrusion, foraminal stenosis or central canal stenosis. T4-T5: No disc protrusion, foraminal stenosis or central canal stenosis. T5-T6: No disc protrusion, foraminal stenosis or central canal stenosis. T6-T7: No disc protrusion, foraminal stenosis or central canal stenosis. T7-T8: No disc protrusion, foraminal stenosis or central canal stenosis. T8-T9: No disc protrusion, foraminal stenosis or central canal stenosis. T9-T10: No disc protrusion, foraminal stenosis or central canal stenosis. T10-T11: No disc protrusion or foraminal stenosis. Central canal is obscured by susceptibility artifact.  T11-T12: No disc protrusion or foraminal stenosis. Central canal is obscured by susceptibility artifact. MRI LUMBAR SPINE FINDINGS Segmentation:  Standard. Alignment:  No static listhesis.  Levoscoliosis of the lumbar spine. Vertebrae: Posterior spinal fixation rods are noted transfixing the thoracolumbar spine with susceptibility artifact partially obscuring portions of the posterior elements and central canal. No fracture, evidence of discitis, or bone lesion. Conus medullaris: Extends to the T12 level and appears normal. Paraspinal and other soft tissues: No acute paraspinal abnormality. Postsurgical changes in the posterior paraspinal soft tissues. Disc levels: Disc spaces: Disc spaces are relatively well maintained. T12-L1: No significant disc bulge. No evidence of neural foraminal stenosis. No central canal stenosis. L1-L2: No significant disc bulge. No right foraminal stenosis. Left neural foramen is obscured by susceptibility artifact. No central canal stenosis. L2-L3: No significant disc bulge. Neural foramina are obscured by susceptibility artifact. No central canal stenosis. L3-L4: Minimal broad-based disc bulge. Moderate bilateral facet arthropathy. Mild right and moderate left foraminal stenosis. No central canal stenosis. L4-L5: Broad-based disc bulge. Moderate bilateral facet arthropathy. Bilateral lateral recess stenosis, left greater than right. No right foraminal stenosis. Mild left foraminal stenosis. No central canal stenosis. L5-S1: Broad-based disc bulge. Moderate bilateral facet arthropathy, left greater than right. Moderate left foraminal stenosis. No right foraminal stenosis. No central canal stenosis. IMPRESSION: 1. Posterior spinal fixation rods are noted transfixing the thoracolumbar spine with susceptibility artifact partially obscuring portions of the posterior elements and central canal. 2. Mild lumbar spine spondylosis as described above. 3. No acute injury of the thoracolumbar spine.  4. 3 x 1.5 cm left upper lobe mass of indeterminate etiology. Differential considerations include pneumonia versus pulmonary neoplasm. Further evaluation with a CT of the chest is recommended. Electronically Signed   By: HKathreen Devoid  On: 11/21/2017 13:03    Procedures Procedures (including critical care time)  Medications Ordered in ED Medications  ketorolac (TORADOL) 15 MG/ML injection 15 mg (has no administration in time range)  diphenhydrAMINE (BENADRYL) injection 50 mg (has no administration in time range)     Initial Impression / Assessment and Plan / ED Course  I have reviewed the triage vital signs and the nursing notes.  Pertinent labs &  imaging results that were available during my care of the patient were reviewed by me and considered in my medical decision making (see chart for details).     MDM  Screen complete  Patient is presenting for evaluation of mid and low back pain.  Patient's symptoms appear to be chronic in nature.  However, patient is reporting increased pain with concurrent reports of fever and IV drug abuse.  MRI will be obtained to further evaluate the patient's symptoms.  MRI does not demonstrate significant acute pathology.  Patient appears to be improved.  Screening labs are otherwise without significant findings other than mild hypokalemia and a possible infiltrate in the left lung.  Patient is not symptomatic from a pulmonary point of view. Will treat the possible early pneumonia with outpatient antibiotics.  Patient is able to ambulate from the ED following her workup.   No narcotics were given to the patient during her evaluation.  Close follow-up is advised.  Strict return precautions are given.   Final Clinical Impressions(s) / ED Diagnoses   Final diagnoses:  Low back pain without sciatica, unspecified back pain laterality, unspecified chronicity  Hypokalemia    ED Discharge Orders        Ordered    azithromycin (ZITHROMAX) 250 MG tablet      11/21/17 1529       Valarie Merino, MD 11/21/17 1548    Valarie Merino, MD 11/21/17 1600

## 2017-11-21 NOTE — ED Notes (Signed)
Will attempt to draw blood when pt returns from MRI.

## 2017-11-21 NOTE — Discharge Instructions (Addendum)
Please return for any problem. Follow up with a regular physician as instructed. Use Z-pack as prescribed for possible early lung infection.

## 2017-11-21 NOTE — ED Notes (Signed)
Pt in MRI.

## 2017-11-21 NOTE — ED Notes (Signed)
Patient Alert and oriented to baseline. Stable and ambulatory to baseline. Patient verbalized understanding of the discharge instructions.  Patient belongings were taken by the patient.   

## 2017-11-21 NOTE — ED Notes (Signed)
Pt getting dressed now.

## 2017-11-21 NOTE — ED Notes (Signed)
Unsuccessful IV attempt per IV team- MRI and EDP made aware.

## 2017-11-21 NOTE — ED Notes (Signed)
Patient transported to MRI 

## 2017-11-21 NOTE — ED Notes (Signed)
IV team at bedside 

## 2017-11-21 NOTE — ED Triage Notes (Signed)
Per EMS, pt from home w/ back pain.  States she has been treated for a UTI since Sunday.  Has chronic back pain however is getting worse, pain is in the middle of back.  States it feels like her legs are weak "as if she had been sitting on the toilet."  No loss of bowels or bladder.  Pt is able to ambulate.  No bowel movement in 5 days, states she goes to a methadone clinic however has not been able to go, used heroin today.

## 2017-11-21 NOTE — ED Notes (Signed)
Pt returned from MRI °

## 2017-11-21 NOTE — ED Notes (Signed)
Patient requesting to talk with physician before leaving.  MD made aware and is at bedside with patient.

## 2017-12-02 ENCOUNTER — Emergency Department: Payer: Self-pay

## 2017-12-02 ENCOUNTER — Inpatient Hospital Stay (HOSPITAL_COMMUNITY): Payer: Medicaid Other

## 2017-12-02 ENCOUNTER — Other Ambulatory Visit: Payer: Self-pay

## 2017-12-02 ENCOUNTER — Inpatient Hospital Stay (HOSPITAL_COMMUNITY)
Admission: EM | Admit: 2017-12-02 | Discharge: 2018-01-30 | DRG: 853 | Disposition: A | Discharge status: Home / Self Care | Payer: Medicaid Other | Attending: Internal Medicine | Admitting: Internal Medicine

## 2017-12-02 ENCOUNTER — Emergency Department (HOSPITAL_COMMUNITY): Payer: Medicaid Other

## 2017-12-02 ENCOUNTER — Encounter (HOSPITAL_COMMUNITY): Payer: Self-pay

## 2017-12-02 DIAGNOSIS — F1510 Other stimulant abuse, uncomplicated: Secondary | ICD-10-CM | POA: Diagnosis present

## 2017-12-02 DIAGNOSIS — G8220 Paraplegia, unspecified: Secondary | ICD-10-CM | POA: Diagnosis present

## 2017-12-02 DIAGNOSIS — I079 Rheumatic tricuspid valve disease, unspecified: Secondary | ICD-10-CM | POA: Diagnosis not present

## 2017-12-02 DIAGNOSIS — F1110 Opioid abuse, uncomplicated: Secondary | ICD-10-CM | POA: Diagnosis not present

## 2017-12-02 DIAGNOSIS — F319 Bipolar disorder, unspecified: Secondary | ICD-10-CM | POA: Diagnosis present

## 2017-12-02 DIAGNOSIS — I2690 Septic pulmonary embolism without acute cor pulmonale: Secondary | ICD-10-CM | POA: Diagnosis present

## 2017-12-02 DIAGNOSIS — F17210 Nicotine dependence, cigarettes, uncomplicated: Secondary | ICD-10-CM | POA: Diagnosis not present

## 2017-12-02 DIAGNOSIS — Z789 Other specified health status: Secondary | ICD-10-CM

## 2017-12-02 DIAGNOSIS — R159 Full incontinence of feces: Secondary | ICD-10-CM | POA: Diagnosis not present

## 2017-12-02 DIAGNOSIS — I10 Essential (primary) hypertension: Secondary | ICD-10-CM | POA: Diagnosis present

## 2017-12-02 DIAGNOSIS — Z915 Personal history of self-harm: Secondary | ICD-10-CM

## 2017-12-02 DIAGNOSIS — F988 Other specified behavioral and emotional disorders with onset usually occurring in childhood and adolescence: Secondary | ICD-10-CM | POA: Diagnosis present

## 2017-12-02 DIAGNOSIS — R7881 Bacteremia: Secondary | ICD-10-CM | POA: Diagnosis not present

## 2017-12-02 DIAGNOSIS — M791 Myalgia, unspecified site: Secondary | ICD-10-CM | POA: Diagnosis not present

## 2017-12-02 DIAGNOSIS — R509 Fever, unspecified: Secondary | ICD-10-CM

## 2017-12-02 DIAGNOSIS — F1120 Opioid dependence, uncomplicated: Secondary | ICD-10-CM | POA: Diagnosis present

## 2017-12-02 DIAGNOSIS — J90 Pleural effusion, not elsewhere classified: Secondary | ICD-10-CM | POA: Diagnosis present

## 2017-12-02 DIAGNOSIS — K5900 Constipation, unspecified: Secondary | ICD-10-CM | POA: Diagnosis not present

## 2017-12-02 DIAGNOSIS — D352 Benign neoplasm of pituitary gland: Secondary | ICD-10-CM | POA: Diagnosis present

## 2017-12-02 DIAGNOSIS — M4626 Osteomyelitis of vertebra, lumbar region: Secondary | ICD-10-CM | POA: Diagnosis not present

## 2017-12-02 DIAGNOSIS — Z885 Allergy status to narcotic agent status: Secondary | ICD-10-CM

## 2017-12-02 DIAGNOSIS — D72829 Elevated white blood cell count, unspecified: Secondary | ICD-10-CM | POA: Diagnosis not present

## 2017-12-02 DIAGNOSIS — F1410 Cocaine abuse, uncomplicated: Secondary | ICD-10-CM | POA: Diagnosis present

## 2017-12-02 DIAGNOSIS — B9561 Methicillin susceptible Staphylococcus aureus infection as the cause of diseases classified elsewhere: Secondary | ICD-10-CM | POA: Diagnosis not present

## 2017-12-02 DIAGNOSIS — A4101 Sepsis due to Methicillin susceptible Staphylococcus aureus: Secondary | ICD-10-CM | POA: Diagnosis present

## 2017-12-02 DIAGNOSIS — I081 Rheumatic disorders of both mitral and tricuspid valves: Secondary | ICD-10-CM | POA: Diagnosis present

## 2017-12-02 DIAGNOSIS — M4646 Discitis, unspecified, lumbar region: Secondary | ICD-10-CM | POA: Diagnosis present

## 2017-12-02 DIAGNOSIS — W458XXS Other foreign body or object entering through skin, sequela: Secondary | ICD-10-CM | POA: Diagnosis not present

## 2017-12-02 DIAGNOSIS — F1990 Other psychoactive substance use, unspecified, uncomplicated: Secondary | ICD-10-CM

## 2017-12-02 DIAGNOSIS — R0600 Dyspnea, unspecified: Secondary | ICD-10-CM | POA: Diagnosis not present

## 2017-12-02 DIAGNOSIS — F1910 Other psychoactive substance abuse, uncomplicated: Secondary | ICD-10-CM

## 2017-12-02 DIAGNOSIS — R200 Anesthesia of skin: Secondary | ICD-10-CM | POA: Diagnosis present

## 2017-12-02 DIAGNOSIS — Z79899 Other long term (current) drug therapy: Secondary | ICD-10-CM

## 2017-12-02 DIAGNOSIS — Z9889 Other specified postprocedural states: Secondary | ICD-10-CM | POA: Diagnosis not present

## 2017-12-02 DIAGNOSIS — I361 Nonrheumatic tricuspid (valve) insufficiency: Secondary | ICD-10-CM | POA: Diagnosis not present

## 2017-12-02 DIAGNOSIS — E871 Hypo-osmolality and hyponatremia: Secondary | ICD-10-CM | POA: Diagnosis present

## 2017-12-02 DIAGNOSIS — Z419 Encounter for procedure for purposes other than remedying health state, unspecified: Secondary | ICD-10-CM

## 2017-12-02 DIAGNOSIS — R52 Pain, unspecified: Secondary | ICD-10-CM | POA: Diagnosis present

## 2017-12-02 DIAGNOSIS — M4640 Discitis, unspecified, site unspecified: Secondary | ICD-10-CM | POA: Diagnosis not present

## 2017-12-02 DIAGNOSIS — Z5181 Encounter for therapeutic drug level monitoring: Secondary | ICD-10-CM | POA: Diagnosis not present

## 2017-12-02 DIAGNOSIS — A419 Sepsis, unspecified organism: Secondary | ICD-10-CM | POA: Diagnosis not present

## 2017-12-02 DIAGNOSIS — J9811 Atelectasis: Secondary | ICD-10-CM | POA: Diagnosis present

## 2017-12-02 DIAGNOSIS — I330 Acute and subacute infective endocarditis: Secondary | ICD-10-CM | POA: Diagnosis present

## 2017-12-02 DIAGNOSIS — R109 Unspecified abdominal pain: Secondary | ICD-10-CM | POA: Diagnosis not present

## 2017-12-02 DIAGNOSIS — B373 Candidiasis of vulva and vagina: Secondary | ICD-10-CM | POA: Diagnosis not present

## 2017-12-02 DIAGNOSIS — N179 Acute kidney failure, unspecified: Secondary | ICD-10-CM | POA: Diagnosis present

## 2017-12-02 DIAGNOSIS — E875 Hyperkalemia: Secondary | ICD-10-CM | POA: Diagnosis not present

## 2017-12-02 DIAGNOSIS — J851 Abscess of lung with pneumonia: Secondary | ICD-10-CM | POA: Diagnosis present

## 2017-12-02 DIAGNOSIS — E2740 Unspecified adrenocortical insufficiency: Secondary | ICD-10-CM | POA: Diagnosis present

## 2017-12-02 DIAGNOSIS — F1190 Opioid use, unspecified, uncomplicated: Secondary | ICD-10-CM | POA: Diagnosis not present

## 2017-12-02 DIAGNOSIS — G061 Intraspinal abscess and granuloma: Secondary | ICD-10-CM | POA: Diagnosis present

## 2017-12-02 DIAGNOSIS — I368 Other nonrheumatic tricuspid valve disorders: Secondary | ICD-10-CM | POA: Diagnosis not present

## 2017-12-02 DIAGNOSIS — Z87442 Personal history of urinary calculi: Secondary | ICD-10-CM

## 2017-12-02 DIAGNOSIS — D6489 Other specified anemias: Secondary | ICD-10-CM | POA: Diagnosis present

## 2017-12-02 DIAGNOSIS — R011 Cardiac murmur, unspecified: Secondary | ICD-10-CM | POA: Diagnosis not present

## 2017-12-02 DIAGNOSIS — G8929 Other chronic pain: Secondary | ICD-10-CM | POA: Diagnosis present

## 2017-12-02 DIAGNOSIS — B1920 Unspecified viral hepatitis C without hepatic coma: Secondary | ICD-10-CM | POA: Diagnosis not present

## 2017-12-02 DIAGNOSIS — E869 Volume depletion, unspecified: Secondary | ICD-10-CM | POA: Diagnosis present

## 2017-12-02 DIAGNOSIS — R601 Generalized edema: Secondary | ICD-10-CM | POA: Diagnosis not present

## 2017-12-02 DIAGNOSIS — R32 Unspecified urinary incontinence: Secondary | ICD-10-CM | POA: Diagnosis present

## 2017-12-02 DIAGNOSIS — M795 Residual foreign body in soft tissue: Secondary | ICD-10-CM | POA: Diagnosis not present

## 2017-12-02 DIAGNOSIS — I071 Rheumatic tricuspid insufficiency: Secondary | ICD-10-CM | POA: Diagnosis not present

## 2017-12-02 DIAGNOSIS — J189 Pneumonia, unspecified organism: Secondary | ICD-10-CM

## 2017-12-02 DIAGNOSIS — M869 Osteomyelitis, unspecified: Secondary | ICD-10-CM | POA: Diagnosis present

## 2017-12-02 DIAGNOSIS — R338 Other retention of urine: Secondary | ICD-10-CM | POA: Diagnosis not present

## 2017-12-02 DIAGNOSIS — Z9114 Patient's other noncompliance with medication regimen: Secondary | ICD-10-CM

## 2017-12-02 DIAGNOSIS — R339 Retention of urine, unspecified: Secondary | ICD-10-CM | POA: Diagnosis present

## 2017-12-02 HISTORY — DX: Sepsis, unspecified organism: A41.9

## 2017-12-02 LAB — COMPREHENSIVE METABOLIC PANEL
ALBUMIN: 2.4 g/dL — AB (ref 3.5–5.0)
ALK PHOS: 168 U/L — AB (ref 38–126)
ALT: 6 U/L (ref 0–44)
ANION GAP: 13 (ref 5–15)
AST: 13 U/L — ABNORMAL LOW (ref 15–41)
BUN: 23 mg/dL — ABNORMAL HIGH (ref 6–20)
CALCIUM: 8.9 mg/dL (ref 8.9–10.3)
CO2: 27 mmol/L (ref 22–32)
Chloride: 90 mmol/L — ABNORMAL LOW (ref 98–111)
Creatinine, Ser: 1.04 mg/dL — ABNORMAL HIGH (ref 0.44–1.00)
GFR calc Af Amer: >60 mL/min (ref >60)
GFR calc non Af Amer: >60 mL/min (ref >60)
GLUCOSE: 113 mg/dL — AB (ref 70–99)
Potassium: 3.6 mmol/L (ref 3.5–5.1)
SODIUM: 130 mmol/L — AB (ref 135–145)
Total Bilirubin: 1 mg/dL (ref 0.3–1.2)
Total Protein: 7.2 g/dL (ref 6.5–8.1)

## 2017-12-02 LAB — URINALYSIS, ROUTINE W REFLEX MICROSCOPIC
BILIRUBIN URINE: NEGATIVE
Glucose, UA: NEGATIVE mg/dL
KETONES UR: NEGATIVE mg/dL
Nitrite: NEGATIVE
Protein, ur: 30 mg/dL — AB
SPECIFIC GRAVITY, URINE: 1.014 (ref 1.005–1.030)
pH: 5 (ref 5.0–8.0)

## 2017-12-02 LAB — I-STAT CG4 LACTIC ACID, ED: Lactic Acid, Venous: 0.97 mmol/L (ref 0.5–1.9)

## 2017-12-02 LAB — CBC
HCT: 28.7 % — ABNORMAL LOW (ref 36.0–46.0)
Hemoglobin: 9.5 g/dL — ABNORMAL LOW (ref 12.0–15.0)
MCH: 29.1 pg (ref 26.0–34.0)
MCHC: 33.1 g/dL (ref 30.0–36.0)
MCV: 87.8 fL (ref 78.0–100.0)
Platelets: 402 10*3/uL — ABNORMAL HIGH (ref 150–400)
RBC: 3.27 MIL/uL — AB (ref 3.87–5.11)
RDW: 13.8 % (ref 11.5–15.5)
WBC: 28.3 10*3/uL — AB (ref 4.0–10.5)

## 2017-12-02 LAB — MRSA PCR SCREENING: MRSA BY PCR: NEGATIVE

## 2017-12-02 LAB — SEDIMENTATION RATE: Sed Rate: 136 mm/hr — ABNORMAL HIGH (ref 0–22)

## 2017-12-02 MED ORDER — SODIUM CHLORIDE 0.9% FLUSH
3.0000 mL | Freq: Two times a day (BID) | INTRAVENOUS | Status: DC
  Administered 2017-12-02 – 2017-12-05 (×6): 3 mL via INTRAVENOUS

## 2017-12-02 MED ORDER — SODIUM CHLORIDE 0.9 % IV BOLUS
500.0000 mL | Freq: Once | INTRAVENOUS | Status: AC
  Administered 2017-12-02: 500 mL via INTRAVENOUS

## 2017-12-02 MED ORDER — SODIUM CHLORIDE 0.9 % IV SOLN: 250.0000 mL | INTRAVENOUS | Status: DC | PRN

## 2017-12-02 MED ORDER — KETOROLAC TROMETHAMINE 30 MG/ML IJ SOLN
30.0000 mg | Freq: Four times a day (QID) | INTRAMUSCULAR | Status: DC | PRN
  Administered 2017-12-02 – 2017-12-03 (×3): 30 mg via INTRAVENOUS
  Filled 2017-12-02 (×3): qty 1

## 2017-12-02 MED ORDER — ACETAMINOPHEN 650 MG RE SUPP: 650.0000 mg | Freq: Four times a day (QID) | RECTAL | Status: DC | PRN

## 2017-12-02 MED ORDER — NICOTINE 21 MG/24HR TD PT24
21.0000 mg | MEDICATED_PATCH | Freq: Every day | TRANSDERMAL | Status: DC
  Administered 2017-12-02 – 2018-01-11 (×37): 21 mg via TRANSDERMAL
  Filled 2017-12-02 (×40): qty 1

## 2017-12-02 MED ORDER — HYDROMORPHONE HCL 1 MG/ML IJ SOLN
1.0000 mg | Freq: Once | INTRAMUSCULAR | Status: AC
Start: 2017-12-02 — End: 2017-12-02
  Administered 2017-12-02: 1 mg via INTRAVENOUS
  Filled 2017-12-02: qty 1

## 2017-12-02 MED ORDER — SODIUM CHLORIDE 0.9% FLUSH
3.0000 mL | Freq: Two times a day (BID) | INTRAVENOUS | Status: DC
  Administered 2017-12-02 – 2017-12-09 (×9): 3 mL via INTRAVENOUS

## 2017-12-02 MED ORDER — KETOROLAC TROMETHAMINE 30 MG/ML IJ SOLN
30.0000 mg | Freq: Once | INTRAMUSCULAR | Status: AC
  Administered 2017-12-02: 30 mg via INTRAMUSCULAR
  Filled 2017-12-02: qty 1

## 2017-12-02 MED ORDER — SODIUM CHLORIDE 0.9 % IV SOLN
1.0000 g | Freq: Once | INTRAVENOUS | Status: AC
  Administered 2017-12-02: 1 g via INTRAVENOUS
  Filled 2017-12-02: qty 10

## 2017-12-02 MED ORDER — IOPAMIDOL (ISOVUE-300) INJECTION 61%
100.0000 mL | Freq: Once | INTRAVENOUS | Status: AC | PRN
  Administered 2017-12-02: 100 mL via INTRAVENOUS

## 2017-12-02 MED ORDER — ONDANSETRON 8 MG PO TBDP
8.0000 mg | ORAL_TABLET | Freq: Once | ORAL | Status: AC
Start: 2017-12-02 — End: 2017-12-02
  Administered 2017-12-02: 8 mg via ORAL
  Filled 2017-12-02: qty 1

## 2017-12-02 MED ORDER — SODIUM CHLORIDE 0.9% FLUSH
3.0000 mL | INTRAVENOUS | Status: DC | PRN
Start: 2017-12-02 — End: 2017-12-06

## 2017-12-02 MED ORDER — VANCOMYCIN HCL 10 G IV SOLR
1500.0000 mg | Freq: Once | INTRAVENOUS | Status: AC
  Administered 2017-12-02: 1500 mg via INTRAVENOUS
  Filled 2017-12-02: qty 1500

## 2017-12-02 MED ORDER — GUAIFENESIN ER 600 MG PO TB12
1200.0000 mg | ORAL_TABLET | Freq: Two times a day (BID) | ORAL | Status: DC
Start: 2017-12-02 — End: 2018-01-06
  Administered 2017-12-02 – 2018-01-05 (×67): 1200 mg via ORAL
  Filled 2017-12-02 (×71): qty 2

## 2017-12-02 MED ORDER — IOPAMIDOL (ISOVUE-300) INJECTION 61%
INTRAVENOUS | Status: AC
  Filled 2017-12-02: qty 100

## 2017-12-02 MED ORDER — POTASSIUM CHLORIDE IN NACL 20-0.9 MEQ/L-% IV SOLN
INTRAVENOUS | Status: DC
  Administered 2017-12-02 – 2017-12-03 (×2): via INTRAVENOUS
  Filled 2017-12-02 (×3): qty 1000

## 2017-12-02 MED ORDER — SODIUM CHLORIDE 0.9 % IV SOLN: 500.0000 mg | INTRAVENOUS | Status: DC

## 2017-12-02 MED ORDER — PIPERACILLIN-TAZOBACTAM 3.375 G IVPB
3.3750 g | Freq: Three times a day (TID) | INTRAVENOUS | Status: DC
  Administered 2017-12-02 – 2017-12-03 (×2): 3.375 g via INTRAVENOUS
  Filled 2017-12-02 (×2): qty 50

## 2017-12-02 MED ORDER — HYDROMORPHONE HCL 1 MG/ML IJ SOLN
1.0000 mg | Freq: Once | INTRAMUSCULAR | Status: AC
  Administered 2017-12-02: 1 mg via INTRAMUSCULAR
  Filled 2017-12-02: qty 1

## 2017-12-02 MED ORDER — FLORANEX PO PACK
1.0000 g | PACK | Freq: Three times a day (TID) | ORAL | Status: DC
  Administered 2017-12-03 – 2018-01-30 (×165): 1 g via ORAL
  Filled 2017-12-02 (×180): qty 1

## 2017-12-02 MED ORDER — DOCUSATE SODIUM 100 MG PO CAPS
100.0000 mg | ORAL_CAPSULE | Freq: Two times a day (BID) | ORAL | Status: DC
  Administered 2017-12-02 – 2018-01-23 (×102): 100 mg via ORAL
  Filled 2017-12-02 (×104): qty 1

## 2017-12-02 MED ORDER — OXYCODONE HCL 5 MG PO TABS
5.0000 mg | ORAL_TABLET | ORAL | Status: DC | PRN
  Administered 2017-12-02 – 2017-12-04 (×6): 5 mg via ORAL
  Filled 2017-12-02 (×7): qty 1

## 2017-12-02 MED ORDER — HEPARIN SODIUM (PORCINE) 5000 UNIT/ML IJ SOLN
5000.0000 [IU] | Freq: Three times a day (TID) | INTRAMUSCULAR | Status: DC
  Administered 2017-12-03 – 2018-01-30 (×170): 5000 [IU] via SUBCUTANEOUS
  Filled 2017-12-02 (×176): qty 1

## 2017-12-02 MED ORDER — ACETAMINOPHEN 325 MG PO TABS
650.0000 mg | ORAL_TABLET | Freq: Four times a day (QID) | ORAL | Status: DC | PRN
  Administered 2017-12-02 – 2017-12-05 (×4): 650 mg via ORAL
  Filled 2017-12-02 (×4): qty 2

## 2017-12-02 MED ORDER — METHADONE HCL 10 MG PO TABS
100.0000 mg | ORAL_TABLET | Freq: Once | ORAL | Status: AC
  Administered 2017-12-02: 100 mg via ORAL
  Filled 2017-12-02: qty 10

## 2017-12-02 MED ORDER — SODIUM CHLORIDE 0.9 % IV BOLUS: 1000.0000 mL | Freq: Once | INTRAVENOUS | Status: DC

## 2017-12-02 MED ORDER — TRAZODONE HCL 50 MG PO TABS
50.0000 mg | ORAL_TABLET | Freq: Every evening | ORAL | Status: DC | PRN
  Administered 2017-12-02 – 2017-12-06 (×5): 50 mg via ORAL
  Filled 2017-12-02 (×5): qty 1

## 2017-12-02 MED ORDER — ONDANSETRON HCL 4 MG/2ML IJ SOLN
4.0000 mg | Freq: Once | INTRAMUSCULAR | Status: DC
  Filled 2017-12-02: qty 2

## 2017-12-02 MED ORDER — BACID PO TABS: 2.0000 | ORAL_TABLET | Freq: Three times a day (TID) | ORAL | Status: DC

## 2017-12-02 MED ORDER — MORPHINE SULFATE (PF) 4 MG/ML IV SOLN
4.0000 mg | INTRAVENOUS | Status: DC | PRN
  Administered 2017-12-02 – 2017-12-04 (×4): 4 mg via INTRAVENOUS
  Filled 2017-12-02 (×4): qty 1

## 2017-12-02 MED ORDER — SODIUM CHLORIDE 0.9 % IV SOLN
500.0000 mg | Freq: Once | INTRAVENOUS | Status: AC
  Administered 2017-12-02: 500 mg via INTRAVENOUS
  Filled 2017-12-02: qty 500

## 2017-12-02 MED ORDER — VANCOMYCIN HCL IN DEXTROSE 750-5 MG/150ML-% IV SOLN
750.0000 mg | Freq: Two times a day (BID) | INTRAVENOUS | Status: DC
  Filled 2017-12-02: qty 150

## 2017-12-02 NOTE — ED Notes (Signed)
Failed attempt to collect labs. Will notify RN

## 2017-12-02 NOTE — ED Notes (Signed)
ED TO INPATIENT HANDOFF REPORT  Name/Age/Gender Kelly [name] 33 y.o. female  Code Status    Code Status Orders  (From admission, onward)        Start     Ordered   12/02/17 1623  Full code  Continuous     12/02/17 1630    Code Status History    Date Active Date Inactive Code Status Order ID Comments User Context   07/31/2016 0219 08/03/2016 1338 Full Code [id number]  Consuello Closs, NP Inpatient   07/28/2016 2019 07/31/2016 0205 Full Code [id number]  Davonna Belling, MD ED   07/26/2016 0359 07/28/2016 1806 Full Code [id number]  Seabron Spates, [place] Inpatient   07/26/2016 0234 07/26/2016 0359 Full Code [id number]  Gery Pray, RN Inpatient   07/26/2016 0207 07/26/2016 0233 Full Code [id number]  [place] Bing, DO Inpatient   05/28/2016 2234 05/29/2016 1714 Full Code [id number]  Meredith Pel, MD Inpatient   05/24/2016 1227 05/28/2016 2234 Full Code [id number]  Isaias Sakai, DO Inpatient   07/13/2014 1258 07/15/2014 1900 Full Code [id number]  Erby Pian, NP ED   08/25/2013 0058 09/01/2013 1935 Full Code [id number]  Laverle Hobby, PA-C Inpatient   08/24/2013 1844 08/25/2013 0058 Full Code [id number]  Blanchie Dessert, MD ED   08/18/2013 1237 08/18/2013 1939 Full Code [id number]  Suann Larry ED      Home/SNF/Other Home  Chief Complaint pain  Level of Care/Admitting Diagnosis ED Disposition    ED Disposition Condition [facility]: [name] [[id number]]  Level of Care: Stepdown [14]  Admit to SDU based on following criteria: Hemodynamic compromise or significant risk of instability:  Patient requiring short term acute titration and management of vasoactive drips, and invasive monitoring (i.e., CVP and Arterial line).  Diagnosis: Sepsis [facility]) [[id number]]  Admitting Physician: Manfred Shirts  Attending Physician: Waldron Labs, DAWOOD S [[id number]]  Estimated length of stay: 3 - 4 days  Certification:: I certify this  patient will need inpatient services for at least 2 midnights  PT Class (Do Not Modify): Inpatient [101]  PT Acc Code (Do Not Modify): Private [1]       Medical History Past Medical History:  Diagnosis Date  . Abscess of breast    rt breast  . ADD (attention deficit disorder)   . Back pain   . Bipolar 1 disorder ([place])   . Hypertension   . Polysubstance abuse ([place])   . Suicidal overdose (HCC)     Allergies Allergies  Allergen Reactions  . Hydrocodone Itching  . Morphine And Related Nausea And Vomiting    IV Location/Drains/Wounds Patient Lines/Drains/Airways Status   Active Line/Drains/Airways    Name:   Placement date:   Placement time:   Site:   Days:   Peripheral IV 12/02/17 Left Foot   12/02/17    1500    Foot   less than 1   Peripheral IV 12/02/17 Right Arm   12/02/17    1546    Arm   less than 1   Incision (Closed) 05/28/16 Arm Right   05/28/16    2036     553          Labs/Imaging Results for orders placed or performed during the hospital encounter of 12/02/17 (from the past 48 hour(s))  CBC     Status: Abnormal   Collection Time: 12/02/17  1:20 PM  Result Value Ref Range   WBC 28.3 (H)  4.0 - 10.5 K/uL   RBC 3.27 (L) 3.87 - 5.11 MIL/uL   Hemoglobin 9.5 (L) 12.0 - 15.0 g/dL   HCT 28.7 (L) 36.0 - 46.0 %   MCV 87.8 78.0 - 100.0 fL   MCH 29.1 26.0 - 34.0 pg   MCHC 33.1 30.0 - 36.0 g/dL   RDW 13.8 11.5 - 15.5 %   Platelets 402 (H) 150 - 400 K/uL    Comment: Performed at [facility], [place] [address], [place], [place] [address]  Comprehensive metabolic panel     Status: Abnormal   Collection Time: 12/02/17  1:20 PM  Result Value Ref Range   Sodium 130 (L) 135 - 145 mmol/L   Potassium 3.6 3.5 - 5.1 mmol/L   Chloride 90 (L) 98 - 111 mmol/L   CO2 27 22 - 32 mmol/L   Glucose, Bld 113 (H) 70 - 99 mg/dL   BUN 23 (H) 6 - 20 mg/dL   Creatinine, Ser 1.04 (H) 0.44 - 1.00 mg/dL   Calcium 8.9 8.9 - 10.3 mg/dL   Total Protein 7.2 6.5 - 8.1 g/dL    Albumin 2.4 (L) 3.5 - 5.0 g/dL   AST 13 (L) 15 - 41 U/L   ALT 6 0 - 44 U/L   Alkaline Phosphatase 168 (H) 38 - 126 U/L   Total Bilirubin 1.0 0.3 - 1.2 mg/dL   GFR calc non Af Amer >60 >60 mL/min   GFR calc Af Amer >60 >60 mL/min    Comment: (NOTE) The eGFR has been calculated using the CKD EPI equation. This calculation has not been validated in all clinical situations. eGFR's persistently <60 mL/min signify possible Chronic Kidney Disease.    Anion gap 13 5 - 15    Comment: Performed at [facility], [place] [address], [place], [place] [address]  Sedimentation rate     Status: Abnormal   Collection Time: 12/02/17  1:20 PM  Result Value Ref Range   Sed Rate 136 (H) 0 - 22 mm/hr    Comment: Performed at [facility], [place] [address] [place], [place] [address]  I-Stat CG4 Lactic Acid, ED     Status: None   Collection Time: 12/02/17  3:03 PM  Result Value Ref Range   Lactic Acid, Venous 0.97 0.5 - 1.9 mmol/L   Dg Chest 2 View  Result Date: 12/02/2017 CLINICAL DATA:  Generalized pain. EXAM: CHEST - 2 VIEW COMPARISON:  11/29/2017 and prior radiographs FINDINGS: The cardiomediastinal silhouette is unremarkable. RIGHT basilar opacity noted-question atelectasis versus airspace disease. No pleural effusion or pneumothorax. No acute bony abnormalities noted. IMPRESSION: RIGHT basilar opacity-question atelectasis versus airspace disease. Electronically Signed   By: Margarette Canada M.D.   On: 12/02/2017 14:46   [place] Ekg Site Rite  Result Date: 12/02/2017 If Site Rite image not attached, placement could not be confirmed due to current cardiac rhythm.   Pending Labs Unresulted Labs (From admission, onward)   Start     Ordered   12/03/17 0500  Comprehensive metabolic panel  Tomorrow morning,   R     12/02/17 1630   12/03/17 0500  CBC  Tomorrow morning,   R     12/02/17 1630   12/02/17 1622  HIV antibody (Routine Testing)  Once,   R     12/02/17 1630   12/02/17  1622  CBC  (heparin)  Once,   R    Comments:  Baseline for heparin therapy IF NOT ALREADY  DRAWN.  Notify MD if PLT < 100 K.    12/02/17 1630   12/02/17 1622  Creatinine, serum  (heparin)  Once,   R    Comments:  Baseline for heparin therapy IF NOT ALREADY DRAWN.    12/02/17 1630   12/02/17 1415  Blood culture (routine x 2)  BLOOD CULTURE X 2,   STAT     12/02/17 1414   12/02/17 1123  Urinalysis, Routine w reflex microscopic  STAT,   R     12/02/17 1123      Vitals/Pain Today's Vitals   12/02/17 1439 12/02/17 1523 12/02/17 1634 12/02/17 1654  BP: (!) 91/54 (!) 91/54 106/67   Pulse: 100 (!) 105 (!) 115   Resp: 20 (!) 21 (!) 24   Temp:      TempSrc:      SpO2: 98% 98% 97%   Weight:      Height:      PainSc:    10-Worst pain ever    Isolation Precautions No active isolations  Medications Medications  sodium chloride 0.9 % bolus 1,000 mL (0 mLs Intravenous Hold 12/02/17 1218)  ondansetron (ZOFRAN) injection 4 mg (0 mg Intravenous Hold 12/02/17 1218)  azithromycin (ZITHROMAX) 500 mg in sodium chloride 0.9 % 250 mL IVPB (500 mg Intravenous New Bag/Given 12/02/17 1655)  nicotine (NICODERM CQ - dosed in mg/24 hours) patch 21 mg (has no administration in time range)  traZODone (DESYREL) tablet 50 mg (has no administration in time range)  heparin injection 5,000 Units (has no administration in time range)  sodium chloride flush (NS) 0.9 % injection 3 mL (has no administration in time range)  sodium chloride flush (NS) 0.9 % injection 3 mL (has no administration in time range)  sodium chloride flush (NS) 0.9 % injection 3 mL (has no administration in time range)  0.9 %  sodium chloride infusion (has no administration in time range)  0.9 % NaCl with KCl 20 mEq/ L  infusion (has no administration in time range)  acetaminophen (TYLENOL) tablet 650 mg (has no administration in time range)    Or  acetaminophen (TYLENOL) suppository 650 mg (has no administration in time range)  ketorolac  (TORADOL) 30 MG/ML injection 30 mg (has no administration in time range)  docusate sodium (COLACE) capsule 100 mg (has no administration in time range)  oxyCODONE (Oxy IR/ROXICODONE) immediate release tablet 5 mg (has no administration in time range)  morphine 4 MG/ML injection 4 mg (has no administration in time range)  guaiFENesin (MUCINEX) 12 hr tablet 1,200 mg (has no administration in time range)  lactobacillus acidophilus (BACID) tablet 2 tablet (has no administration in time range)  HYDROmorphone (DILAUDID) injection 1 mg (1 mg Intravenous Given 12/02/17 1508)  HYDROmorphone (DILAUDID) injection 1 mg (1 mg Intramuscular Given 12/02/17 1233)  ondansetron (ZOFRAN-ODT) disintegrating tablet 8 mg (8 mg Oral Given 12/02/17 1234)  ketorolac (TORADOL) 30 MG/ML injection 30 mg (30 mg Intramuscular Given 12/02/17 1413)  cefTRIAXone (ROCEPHIN) 1 g in sodium chloride 0.9 % 100 mL IVPB (0 g Intravenous Stopped 12/02/17 1626)    Mobility Walks normally, has not walked since in ED

## 2017-12-02 NOTE — ED Provider Notes (Signed)
Dr. Ashok Cordia requested for IV access.  EMERGENCY DEPARTMENT  US GUIDANCE EXAM Emergency Ultrasound:  US Guidance for Needle Guidance  INDICATIONS: Difficult vascular access Linear probe used in real-time to visualize location of needle entry through skin.   PERFORMED BY: Myself IMAGES ARCHIVED?: No LIMITATIONS: Pain VIEWS USED: Transverse INTERPRETATION: Right arm and Needle gauge 20    Domenic Moras, PA-C 12/02/17 1520    Lajean Saver, MD 12/02/17 [phone]

## 2017-12-02 NOTE — ED Notes (Signed)
IV team unsuccessful. Per IV RN , attempting to contact different IV team member.

## 2017-12-02 NOTE — Progress Notes (Signed)
RN aware PICC will not be able to be done today. Question if IR may be able to place. Will continue to monitor.

## 2017-12-02 NOTE — H&P (Signed)
TRH H&P   Patient Demographics:    Kelly [name], is a 32 y.o. female  MRN: [id number]   DOB - 1985/07/29  Admit Date - 12/02/2017  Outpatient Primary MD for the patient is Patient, No Pcp Per  Referring MD/NP/Kelly: Dr Ashok Cordia  Patient coming from: Home  Chief Complaint  Patient presents with  . Muscle Pain      HPI:    Kelly [name]  is a 32 y.o. female, past medical history of IV drug abuse, ADD, history of scoliosis, with back surgery and fixing rods, taken with multiple complaints, reported fever and chills at home, reported generalized body ache, she had ED visit 7/11 with similar complaints, she had no leukocytosis then, negative urine analysis, she had MRI of her thoracolumbar spine with no evidence of infection, port worsening symptoms, generalized body ache, she does report some cough, nonproductive, she was afebrile in ED, but had leukocytosis of 28,000, she denies any headache, nausea, vomiting, dysuria, sore throat, no focal deficits, lower extremity weakness, urine or stool incontinence, x-ray was significant for right lower opacity, started on Rocephin and azithromycin, called to admit, she was noted to have murmur, she reports IV drug abuse, most recently yesterday for heroin, reports she is on methadone clinic 80 mg daily,(awaiting those confirmation from [facility]).    Review of systems:    In addition to the HPI above,  Reports fever and chills at home, generalized body ache, poor appetite No Headache, No changes with Vision or hearing, No problems swallowing food or Liquids, No Chest pain, reports cough, nonproductive. No Abdominal pain, No Nausea or Vommitting, Bowel movements are regular, No Blood in stool or Urine, No dysuria, No new skin rashes or bruises, He does report generalized body ache No new weakness, tingling, numbness in any  extremity, No recent weight gain or loss, No polyuria, polydypsia or polyphagia, No significant Mental Stressors.  She endorses IV drug abuse  A full 10 point Review of Systems was done, except as stated above, all other Review of Systems were negative.   With Past History of the following :    Past Medical History:  Diagnosis Date  . Abscess of breast    rt breast  . ADD (attention deficit disorder)   . Back pain   . Bipolar 1 disorder ([place])   . Hypertension   . Polysubstance abuse ([place])   . Suicidal overdose [facility])       Past Surgical History:  Procedure Laterality Date  . BACK SURGERY     scoliosis-in care everywhere 08/1999  . CESAREAN SECTION    . CHOLECYSTECTOMY  March 2015  . FOREIGN BODY REMOVAL Right 05/28/2016   Procedure: REMOVAL FOREIGN BODY RIGHT ARM;  Surgeon: Meredith Pel, MD;  Location: [facility];  Service: Orthopedics;  Laterality: Right;  . IRRIGATION AND DEBRIDEMENT ABSCESS Right 07/13/2014   Procedure: IRRIGATION  AND DEBRIDEMEN TRIGHT BREAST ABSCESS;  Surgeon: Donnie Mesa, MD;  Location: [facility];  Service: General;  Laterality: Right;      Social History:     Social History   Tobacco Use  . Smoking status: Current Every Day Smoker    Packs/day: 1.00    Years: 13.00    Pack years: 13.00    Types: Cigarettes  . Smokeless tobacco: Never Used  Substance Use Topics  . Alcohol use: Yes    Comment: one or two drinks weekly     Lives - at home   Mobility - independent.    Family History :     Family History  Problem Relation Age of Onset  . Diabetes Mother   . Diabetes Father   . Hypertension Father   . Heart attack Neg Hx   . Hyperlipidemia Neg Hx   . Sudden death Neg Hx      Home Medications:   Prior to Admission medications   Medication Sig Start Date End Date Taking? Authorizing Provider  methadone (DOLOPHINE) 10 MG/5ML solution Take 100 mg by mouth daily.   Yes [provider]  buPROPion (WELLBUTRIN XL) 150 MG 24 hr  tablet Take 1 tablet (150 mg total) by mouth daily. Patient not taking: Reported on 04/16/2017 08/04/16   Kerrie Buffalo, NP  gabapentin (NEURONTIN) 300 MG capsule Take 1 capsule (300 mg total) by mouth 3 (three) times daily. Patient not taking: Reported on 04/16/2017 08/03/16   Kerrie Buffalo, NP  hydrOXYzine (ATARAX/VISTARIL) 50 MG tablet Take 1 tablet (50 mg total) by mouth every 6 (six) hours as needed for anxiety. Patient not taking: Reported on 04/16/2017 08/03/16   Kerrie Buffalo, NP  ketorolac (TORADOL) 10 MG tablet Take 1 tablet (10 mg total) by mouth every 6 (six) hours as needed. Patient not taking: Reported on 12/02/2017 11/17/17   Isla Pence, MD  nicotine (NICODERM CQ - DOSED IN MG/24 HOURS) 21 mg/24hr patch Place 1 patch (21 mg total) onto the skin daily. Patient not taking: Reported on 04/16/2017 08/04/16   Kerrie Buffalo, NP  phenazopyridine (PYRIDIUM) 200 MG tablet Take 1 tablet (200 mg total) by mouth 3 (three) times daily. Patient not taking: Reported on 12/02/2017 11/17/17   Isla Pence, MD  traZODone (DESYREL) 50 MG tablet Take 1 tablet (50 mg total) by mouth at bedtime as needed for sleep. Patient not taking: Reported on 04/16/2017 08/03/16   Kerrie Buffalo, NP     Allergies:     Allergies  Allergen Reactions  . Hydrocodone Itching  . Morphine And Related Nausea And Vomiting     Physical Exam:   Vitals  Blood pressure 106/67, pulse (!) 115, temperature 98.2 F (36.8 C), temperature source Oral, resp. rate (!) 24, height 5' 7"  (1.702 m), weight 72.6 kg (160 lb), last menstrual period 11/11/2017, SpO2 97 %, unknown if currently breastfeeding.   Patient was seen and examined by the presence of female chaperone CNA [place]  1. General developed female, laying in bed in mild discomfort  2. Normal affect and insight, Not Suicidal or Homicidal, Awake Alert, Oriented X 3.  3. No F.N deficits, ALL C.Nerves Intact, Strength 5/5 all 4 extremities, Sensation intact all 4  extremities, Plantars down going.  4. Ears and Eyes appear Normal, Conjunctivae clear, PERRLA.  Dry oral mucosa  5. Supple Neck, No JVD, No cervical lymphadenopathy appriciated, No Carotid Bruits.  6. Symmetrical Chest wall movement, Good air movement bilaterally, CTAB.  7.  Tachycardic no  Gallops, Rubs, No Parasternal Heave.  She has systolic murmur  8. Positive Bowel Sounds, Abdomen Soft, No tenderness, No organomegaly appriciated,No rebound -guarding or rigidity.  9.  No Cyanosis, Normal Skin Turgor, No Skin Rash or Bruise.  She has injection sites in the anterior thigh area, but no abscess or cellulitis noted  10. Good muscle tone,  joints appear normal , no effusions, Normal ROM.  11. No Palpable Lymph Nodes in Neck or Axillae     Data Review:    CBC Recent Labs  Lab 12/02/17 1320  WBC 28.3*  HGB 9.5*  HCT 28.7*  PLT 402*  MCV 87.8  MCH 29.1  MCHC 33.1  RDW 13.8   ------------------------------------------------------------------------------------------------------------------  Chemistries  Recent Labs  Lab 12/02/17 1320  NA 130*  K 3.6  CL 90*  CO2 27  GLUCOSE 113*  BUN 23*  CREATININE 1.04*  CALCIUM 8.9  AST 13*  ALT 6  ALKPHOS 168*  BILITOT 1.0   ------------------------------------------------------------------------------------------------------------------ estimated creatinine clearance is 74.8 mL/min (A) (by C-G formula based on SCr of 1.04 mg/dL (H)). ------------------------------------------------------------------------------------------------------------------ No results for input(s): TSH, T4TOTAL, T3FREE, THYROIDAB in the last 72 hours.  Invalid input(s): FREET3  Coagulation profile No results for input(s): INR, PROTIME in the last 168 hours. ------------------------------------------------------------------------------------------------------------------- No results for input(s): DDIMER in the last 72  hours. -------------------------------------------------------------------------------------------------------------------  Cardiac Enzymes No results for input(s): CKMB, TROPONINI, MYOGLOBIN in the last 168 hours.  Invalid input(s): CK ------------------------------------------------------------------------------------------------------------------ No results found for: BNP   ---------------------------------------------------------------------------------------------------------------  Urinalysis    Component Value Date/Time   COLORURINE AMBER (A) 11/21/2017 0625   APPEARANCEUR HAZY (A) 11/21/2017 0625   APPEARANCEUR Hazy 07/10/2013 1121   LABSPEC 1.027 11/21/2017 0625   LABSPEC 1.020 07/10/2013 1121   PHURINE 5.0 11/21/2017 0625   GLUCOSEU NEGATIVE 11/21/2017 0625   GLUCOSEU Negative 07/10/2013 1121   HGBUR NEGATIVE 11/21/2017 0625   BILIRUBINUR SMALL (A) 11/21/2017 0625   BILIRUBINUR Negative 07/10/2013 1121   KETONESUR NEGATIVE 11/21/2017 0625   PROTEINUR 30 (A) 11/21/2017 0625   UROBILINOGEN 1.0 02/15/2015 0530   NITRITE NEGATIVE 11/21/2017 0625   LEUKOCYTESUR SMALL (A) 11/21/2017 0625   LEUKOCYTESUR Trace 07/10/2013 1121    ----------------------------------------------------------------------------------------------------------------   Imaging Results:    Dg Chest 2 View  Result Date: 12/02/2017 CLINICAL DATA:  Generalized pain. EXAM: CHEST - 2 VIEW COMPARISON:  11/29/2017 and prior radiographs FINDINGS: The cardiomediastinal silhouette is unremarkable. RIGHT basilar opacity noted-question atelectasis versus airspace disease. No pleural effusion or pneumothorax. No acute bony abnormalities noted. IMPRESSION: RIGHT basilar opacity-question atelectasis versus airspace disease. Electronically Signed   By: Margarette Canada M.D.   On: 12/02/2017 14:46   [place] Ekg Site Rite  Result Date: 12/02/2017 If Site Rite image not attached, placement could not be confirmed due to current  cardiac rhythm.   My personal review of EKG: Pending   Assessment & Plan:    Active Problems:   Cocaine abuse (HCC)   Polysubstance abuse (HCC)   Sepsis ([place])   Sepsis -Reports fever at home, he has tachycardia, tachypnea, leukocytosis, hypotension in ED, he is with known IV drug abuse, her only source is up pneumonia on chest x-ray, blood cultures were obtained in ED, I will follow, she does have heart murmur, I will obtain 2D echo, as well CT chest/abdomen/pelvis for further evaluation, I will start on broad-spectrum antibiotic IV vancomycin and Zosyn, will analysis, she will be admitted to ICU, continue with IV fluids. -She was examined, no evidence of  abscess or skin infection -Patient does have rods in her back, she had MRI thoracolumbar spine 11/21/2017, significant for left upper lobe mass, otherwise no evidence of discitis or infection.  Pneumonia -His x-ray significant for up pneumonia, recent MRI with right upper lung lesion, pneumonia in the right lower lung, which has been present on previous imaging, I will broaden and antibiotic coverage from Rocephin and azithromycin to Vanco and Zosyn, CT chest -Use incentive spirometry, flutter valve, start Mucinex, will start on PRN nebs.  IV drug abuse/poly substance abuse -She reports she did inject heroin yesterday, she tested +10 days ago for opiates, amphetamines and cocaine, he was counseled. -She does report she is on 80 mg of methadone at [facility], I have called discussed with on call, they will confirm and call me back. 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  Code Status Full  Likely DC to  Pending further work up.  Condition GUARDED    Consults called: none    Admission status: inpatient  Time spent in minutes : 65 minutes   Phillips Climes M.D on 12/02/2017 at 4:38 PM  Between 7am to 7pm - Pager - [phone]. After 7pm go to [email or web address] - password [email or web address]  [facility] - Office  [phone]

## 2017-12-02 NOTE — ED Notes (Signed)
Okay for IV in foot per Dr Ashok Cordia, as PICC cannot be placed today

## 2017-12-02 NOTE — ED Notes (Signed)
Femoral stick by Dr Ashok Cordia for labs

## 2017-12-02 NOTE — Progress Notes (Signed)
Pharmacy Antibiotic Note  Kelly [name] is a 32 y.o. female admitted on 12/02/2017 with sepsis.  Possible sources include- PNA (+CXR), bacteremia (+IVDA), intra-abdominal infection.   Pharmacy has been consulted for Vancomycin & Zosyn dosing. Zithromax per MD.  12/02/2017:  Afebrile since admission, subjective reports of fever at home  Elevated WBC 28.3, LA WNL  Scr elevated from baseline- 1.04, CrCl~8m/min (baseline ~0.6)  Plan: Zosyn 3.375g IV q8h (4 hour infusion).  Vancomycin 15087mloading dose then 75033mV q12h (goal AUC 400-500) Zithromax per MD Daily Scr Monitor renal function and cx data   Height: 5' 7"  (170.2 cm) Weight: 160 lb (72.6 kg) IBW/kg (Calculated) : 61.6  Temp (24hrs), Avg:98 F (36.7 C), Min:97.8 F (36.6 C), Max:98.2 F (36.8 C)  Recent Labs  Lab 12/02/17 1320 12/02/17 1503  WBC 28.3*  --   CREATININE 1.04*  --   LATICACIDVEN  --  0.97    Estimated Creatinine Clearance: 74.8 mL/min (A) (by C-G formula based on SCr of 1.04 mg/dL (H)).    Allergies  Allergen Reactions  . Hydrocodone Itching  . Morphine And Related Nausea And Vomiting    Antimicrobials this admission: 7/22 Rocephin x1 7/22 Zosyn >>  7/22 Vanc >>   Dose adjustments this admission:  Microbiology results: 7/22 BCx:  7/22 MRSA PCR:  Thank you for allowing pharmacy to be a part of this patient's care.  LilBiagio Borg22/2019 4:46 PM

## 2017-12-02 NOTE — ED Notes (Signed)
Pt was placed on pure wick upon arrival. Pt is aware that MD needs UA sample.

## 2017-12-02 NOTE — ED Provider Notes (Addendum)
[facility] DEPT Provider Note   CSN: [id number] Arrival date & time: 12/02/17  1056     History   Chief Complaint Chief Complaint  Patient presents with  . Withdrawal    HPI Kelly [name] is a 32 y.o. female.  Patient c/o diffuse body pain for past several days. Symptoms moderate-severe, constant, persistent, non radiating, worse w movement. Pt very difficult/poor historian. Hx heroin/polysubstance abuse. Subjective fever. No focal pain. Denies headache. No chest pain or sob. No abd pain or vomiting. No dysuria or gu c/o. Occasional non prod cough, no sore throat, or nasal symptoms. Denies radicular pain or numbness/weakness.   The history is provided by the patient.    Past Medical History:  Diagnosis Date  . Abscess of breast    rt breast  . ADD (attention deficit disorder)   . Back pain   . Bipolar 1 disorder ([place])   . Hypertension   . Polysubstance abuse ([place])   . Suicidal overdose [facility])     Patient Active Problem List   Diagnosis Date Noted  . Facial cellulitis 08/29/2016  . Bipolar disorder ([place]) 07/31/2016  . Major depressive disorder, recurrent episode with anxious distress ([place]) 07/29/2016  . Normal labor 07/26/2016  . Foreign body granuloma of soft tissue, NEC, right upper arm   . Pyelonephritis 05/28/2016  . Polysubstance abuse ([place]) 05/25/2016  . No prenatal care in current pregnancy 05/25/2016  . Chronic hypertension affecting pregnancy 05/25/2016  . History of cesarean delivery affecting pregnancy 05/25/2016  . Community acquired pneumonia 05/25/2016  . Pyelonephritis affecting pregnancy 05/24/2016  . Palliative care encounter   . Abscess of right breast 07/13/2014  . Opioid use disorder, severe, dependence ([place]) 08/25/2013  . Unspecified episodic mood disorder 08/25/2013  . ADHD (attention deficit hyperactivity disorder) 08/25/2013  . Cocaine abuse ([place]) 08/25/2013  . Thoracic back pain 01/29/2013  . CROHN'S  DISEASE 05/03/2005  . COLONIC POLYPS 02/28/2003    Past Surgical History:  Procedure Laterality Date  . BACK SURGERY     scoliosis-in care everywhere 08/1999  . CESAREAN SECTION    . CHOLECYSTECTOMY  March 2015  . FOREIGN BODY REMOVAL Right 05/28/2016   Procedure: REMOVAL FOREIGN BODY RIGHT ARM;  Surgeon: Meredith Pel, MD;  Location: [facility];  Service: Orthopedics;  Laterality: Right;  . IRRIGATION AND DEBRIDEMENT ABSCESS Right 07/13/2014   Procedure: IRRIGATION AND DEBRIDEMEN TRIGHT BREAST ABSCESS;  Surgeon: Donnie Mesa, MD;  Location: [facility];  Service: General;  Laterality: Right;     OB History    Gravida  5   Para  1   Term  1   Preterm      AB  3   Living  1     SAB  3   TAB      Ectopic      Multiple      Live Births  1            Home Medications    Prior to Admission medications   Medication Sig Start Date End Date Taking? Authorizing Provider  methadone (DOLOPHINE) 10 MG/5ML solution Take 100 mg by mouth daily.   Yes [provider]  buPROPion (WELLBUTRIN XL) 150 MG 24 hr tablet Take 1 tablet (150 mg total) by mouth daily. Patient not taking: Reported on 04/16/2017 08/04/16   Kerrie Buffalo, NP  gabapentin (NEURONTIN) 300 MG capsule Take 1 capsule (300 mg total) by mouth 3 (three) times daily. Patient not taking:  Reported on 04/16/2017 08/03/16   Kerrie Buffalo, NP  hydrOXYzine (ATARAX/VISTARIL) 50 MG tablet Take 1 tablet (50 mg total) by mouth every 6 (six) hours as needed for anxiety. Patient not taking: Reported on 04/16/2017 08/03/16   Kerrie Buffalo, NP  ketorolac (TORADOL) 10 MG tablet Take 1 tablet (10 mg total) by mouth every 6 (six) hours as needed. Patient not taking: Reported on 12/02/2017 11/17/17   Isla Pence, MD  nicotine (NICODERM CQ - DOSED IN MG/24 HOURS) 21 mg/24hr patch Place 1 patch (21 mg total) onto the skin daily. Patient not taking: Reported on 04/16/2017 08/04/16   Kerrie Buffalo, NP  phenazopyridine (PYRIDIUM)  200 MG tablet Take 1 tablet (200 mg total) by mouth 3 (three) times daily. Patient not taking: Reported on 12/02/2017 11/17/17   Isla Pence, MD  traZODone (DESYREL) 50 MG tablet Take 1 tablet (50 mg total) by mouth at bedtime as needed for sleep. Patient not taking: Reported on 04/16/2017 08/03/16   Kerrie Buffalo, NP    Family History Family History  Problem Relation Age of Onset  . Diabetes Mother   . Diabetes Father   . Hypertension Father   . Heart attack Neg Hx   . Hyperlipidemia Neg Hx   . Sudden death Neg Hx     Social History Social History   Tobacco Use  . Smoking status: Current Every Day Smoker    Packs/day: 1.00    Years: 13.00    Pack years: 13.00    Types: Cigarettes  . Smokeless tobacco: Never Used  Substance Use Topics  . Alcohol use: Yes    Comment: one or two drinks weekly  . Drug use: Yes    Types: Cocaine, Heroin, IV    Comment: heroin, crack cocaine     Allergies   Hydrocodone and Morphine and related   Review of Systems Review of Systems  Constitutional: Negative for chills and fever.  HENT: Negative for sore throat.   Eyes: Negative for visual disturbance.  Respiratory: Negative for shortness of breath.   Cardiovascular: Negative for chest pain and leg swelling.  Gastrointestinal: Negative for abdominal pain and vomiting.  Endocrine: Negative for polyuria.  Genitourinary: Negative for dysuria and flank pain.  Musculoskeletal: Positive for back pain and myalgias. Negative for neck pain and neck stiffness.  Skin: Negative for rash.  Neurological: Negative for weakness, numbness and headaches.  Hematological: Does not bruise/bleed easily.  Psychiatric/Behavioral: Negative for confusion.     Physical Exam Updated Vital Signs BP 90/64   Pulse (!) 103   Temp 97.8 F (36.6 C) (Oral)   Resp 20   Ht 1.702 m (5' 7" )   Wt 72.6 kg (160 lb)   LMP 11/11/2017 (Approximate)   SpO2 95%   BMI 25.06 kg/m   Physical Exam  Constitutional:  She appears well-developed and well-nourished.  Pt very anxious.   Eyes: Pupils are equal, round, and reactive to light. Conjunctivae are normal. No scleral icterus.  Neck: Neck supple. No tracheal deviation present.  No stiffness or rigidity  Cardiovascular: Normal rate, regular rhythm, normal heart sounds and intact distal pulses. Exam reveals no gallop and no friction rub.  No murmur heard. Pulmonary/Chest: Effort normal and breath sounds normal. No respiratory distress.  Abdominal: Soft. Normal appearance and bowel sounds are normal. She exhibits no distension. There is no tenderness.  Genitourinary:  Genitourinary Comments: No cva tenderness.   Musculoskeletal: She exhibits no edema.  CTLS spine, non tender, aligned, no step off. Good  rom bil ext, no focal bony tenderness. Distal pulses palp bil.   Neurological: She is alert.  Skin: Skin is warm and dry. No rash noted.  No petechia, splinter hem, or other specific stigmata of endocarditis.   Psychiatric:  Very anxious, uncooperative.   Nursing note and vitals reviewed.    ED Treatments / Results  Labs (all labs ordered are listed, but only abnormal results are displayed) Results for orders placed or performed during the hospital encounter of 12/02/17  CBC  Result Value Ref Range   WBC 28.3 (H) 4.0 - 10.5 K/uL   RBC 3.27 (L) 3.87 - 5.11 MIL/uL   Hemoglobin 9.5 (L) 12.0 - 15.0 g/dL   HCT 28.7 (L) 36.0 - 46.0 %   MCV 87.8 78.0 - 100.0 fL   MCH 29.1 26.0 - 34.0 pg   MCHC 33.1 30.0 - 36.0 g/dL   RDW 13.8 11.5 - 15.5 %   Platelets 402 (H) 150 - 400 K/uL  Comprehensive metabolic panel  Result Value Ref Range   Sodium 130 (L) 135 - 145 mmol/L   Potassium 3.6 3.5 - 5.1 mmol/L   Chloride 90 (L) 98 - 111 mmol/L   CO2 27 22 - 32 mmol/L   Glucose, Bld 113 (H) 70 - 99 mg/dL   BUN 23 (H) 6 - 20 mg/dL   Creatinine, Ser 1.04 (H) 0.44 - 1.00 mg/dL   Calcium 8.9 8.9 - 10.3 mg/dL   Total Protein 7.2 6.5 - 8.1 g/dL   Albumin 2.4 (L)  3.5 - 5.0 g/dL   AST 13 (L) 15 - 41 U/L   ALT 6 0 - 44 U/L   Alkaline Phosphatase 168 (H) 38 - 126 U/L   Total Bilirubin 1.0 0.3 - 1.2 mg/dL   GFR calc non Af Amer >60 >60 mL/min   GFR calc Af Amer >60 >60 mL/min   Anion gap 13 5 - 15  I-Stat CG4 Lactic Acid, ED  Result Value Ref Range   Lactic Acid, Venous 0.97 0.5 - 1.9 mmol/L   Dg Chest 2 View  Result Date: 12/02/2017 CLINICAL DATA:  Generalized pain. EXAM: CHEST - 2 VIEW COMPARISON:  11/29/2017 and prior radiographs FINDINGS: The cardiomediastinal silhouette is unremarkable. RIGHT basilar opacity noted-question atelectasis versus airspace disease. No pleural effusion or pneumothorax. No acute bony abnormalities noted. IMPRESSION: RIGHT basilar opacity-question atelectasis versus airspace disease. Electronically Signed   By: Margarette Canada M.D.   On: 12/02/2017 14:46   Dg Chest 2 View  Result Date: 11/21/2017 CLINICAL DATA:  Chest and back region pain EXAM: CHEST - 2 VIEW COMPARISON:  July 31, 2016 FINDINGS: There is patchy infiltrate in the posterior left base. The lungs elsewhere are clear. Heart size and pulmonary vascularity are normal. No adenopathy. Rod fixation noted in the visualized thoracic and upper lumbar regions, unchanged. IMPRESSION: Infiltrate posterior left base, felt to represent focal pneumonia. Lungs elsewhere clear. Stable cardiac silhouette. Postoperative change in the spine region. Electronically Signed   By: Lowella Grip III M.D.   On: 11/21/2017 14:42   Mr Thoracic Spine W Wo Contrast  Result Date: 11/21/2017 CLINICAL DATA:  Chronic back pain getting worse. Pain in the middle of the back. EXAM: MRI THORACIC AND LUMBAR SPINE WITHOUT AND WITH CONTRAST TECHNIQUE: Multiplanar and multiecho pulse sequences of the thoracic and lumbar spine were obtained without and with intravenous contrast. CONTRAST:  86m MULTIHANCE GADOBENATE DIMEGLUMINE 529 MG/ML IV SOLN COMPARISON:  CT chest 05/24/2016 FINDINGS: MRI THORACIC SPINE  FINDINGS Alignment: No static listhesis. Dextroscoliosis of the thoracic spine. Vertebrae: Posterior spinal fixation rods are noted transfixing the thoracolumbar spine with severe susceptibility artifact obscuring large portions of the posterior elements and central canal. No fracture, evidence of discitis, or bone lesion. Cord:  Normal signal and morphology. Paraspinal and other soft tissues: No acute paraspinal abnormality. 3 x 1.5 cm left upper lobe mass of indeterminate etiology. Left pleural thickening. Disc levels: Disc spaces:  Disc spaces are maintained. T1-T2: No disc protrusion, foraminal stenosis or central canal stenosis. T2-T3: Small shallow right paracentral disc protrusion. No foraminal or central canal stenosis. T3-T4: No disc protrusion, foraminal stenosis or central canal stenosis. T4-T5: No disc protrusion, foraminal stenosis or central canal stenosis. T5-T6: No disc protrusion, foraminal stenosis or central canal stenosis. T6-T7: No disc protrusion, foraminal stenosis or central canal stenosis. T7-T8: No disc protrusion, foraminal stenosis or central canal stenosis. T8-T9: No disc protrusion, foraminal stenosis or central canal stenosis. T9-T10: No disc protrusion, foraminal stenosis or central canal stenosis. T10-T11: No disc protrusion or foraminal stenosis. Central canal is obscured by susceptibility artifact. T11-T12: No disc protrusion or foraminal stenosis. Central canal is obscured by susceptibility artifact. MRI LUMBAR SPINE FINDINGS Segmentation:  Standard. Alignment:  No static listhesis.  Levoscoliosis of the lumbar spine. Vertebrae: Posterior spinal fixation rods are noted transfixing the thoracolumbar spine with susceptibility artifact partially obscuring portions of the posterior elements and central canal. No fracture, evidence of discitis, or bone lesion. Conus medullaris: Extends to the T12 level and appears normal. Paraspinal and other soft tissues: No acute paraspinal  abnormality. Postsurgical changes in the posterior paraspinal soft tissues. Disc levels: Disc spaces: Disc spaces are relatively well maintained. T12-L1: No significant disc bulge. No evidence of neural foraminal stenosis. No central canal stenosis. L1-L2: No significant disc bulge. No right foraminal stenosis. Left neural foramen is obscured by susceptibility artifact. No central canal stenosis. L2-L3: No significant disc bulge. Neural foramina are obscured by susceptibility artifact. No central canal stenosis. L3-L4: Minimal broad-based disc bulge. Moderate bilateral facet arthropathy. Mild right and moderate left foraminal stenosis. No central canal stenosis. L4-L5: Broad-based disc bulge. Moderate bilateral facet arthropathy. Bilateral lateral recess stenosis, left greater than right. No right foraminal stenosis. Mild left foraminal stenosis. No central canal stenosis. L5-S1: Broad-based disc bulge. Moderate bilateral facet arthropathy, left greater than right. Moderate left foraminal stenosis. No right foraminal stenosis. No central canal stenosis. IMPRESSION: 1. Posterior spinal fixation rods are noted transfixing the thoracolumbar spine with susceptibility artifact partially obscuring portions of the posterior elements and central canal. 2. Mild lumbar spine spondylosis as described above. 3. No acute injury of the thoracolumbar spine. 4. 3 x 1.5 cm left upper lobe mass of indeterminate etiology. Differential considerations include pneumonia versus pulmonary neoplasm. Further evaluation with a CT of the chest is recommended. Electronically Signed   By: Kathreen Devoid   On: 11/21/2017 13:03   Mr Lumbar Spine W Wo Contrast  Result Date: 11/21/2017 CLINICAL DATA:  Chronic back pain getting worse. Pain in the middle of the back. EXAM: MRI THORACIC AND LUMBAR SPINE WITHOUT AND WITH CONTRAST TECHNIQUE: Multiplanar and multiecho pulse sequences of the thoracic and lumbar spine were obtained without and with  intravenous contrast. CONTRAST:  64m MULTIHANCE GADOBENATE DIMEGLUMINE 529 MG/ML IV SOLN COMPARISON:  CT chest 05/24/2016 FINDINGS: MRI THORACIC SPINE FINDINGS Alignment: No static listhesis. Dextroscoliosis of the thoracic spine. Vertebrae: Posterior spinal fixation rods are noted transfixing the thoracolumbar spine with severe susceptibility artifact obscuring large  portions of the posterior elements and central canal. No fracture, evidence of discitis, or bone lesion. Cord:  Normal signal and morphology. Paraspinal and other soft tissues: No acute paraspinal abnormality. 3 x 1.5 cm left upper lobe mass of indeterminate etiology. Left pleural thickening. Disc levels: Disc spaces:  Disc spaces are maintained. T1-T2: No disc protrusion, foraminal stenosis or central canal stenosis. T2-T3: Small shallow right paracentral disc protrusion. No foraminal or central canal stenosis. T3-T4: No disc protrusion, foraminal stenosis or central canal stenosis. T4-T5: No disc protrusion, foraminal stenosis or central canal stenosis. T5-T6: No disc protrusion, foraminal stenosis or central canal stenosis. T6-T7: No disc protrusion, foraminal stenosis or central canal stenosis. T7-T8: No disc protrusion, foraminal stenosis or central canal stenosis. T8-T9: No disc protrusion, foraminal stenosis or central canal stenosis. T9-T10: No disc protrusion, foraminal stenosis or central canal stenosis. T10-T11: No disc protrusion or foraminal stenosis. Central canal is obscured by susceptibility artifact. T11-T12: No disc protrusion or foraminal stenosis. Central canal is obscured by susceptibility artifact. MRI LUMBAR SPINE FINDINGS Segmentation:  Standard. Alignment:  No static listhesis.  Levoscoliosis of the lumbar spine. Vertebrae: Posterior spinal fixation rods are noted transfixing the thoracolumbar spine with susceptibility artifact partially obscuring portions of the posterior elements and central canal. No fracture, evidence of  discitis, or bone lesion. Conus medullaris: Extends to the T12 level and appears normal. Paraspinal and other soft tissues: No acute paraspinal abnormality. Postsurgical changes in the posterior paraspinal soft tissues. Disc levels: Disc spaces: Disc spaces are relatively well maintained. T12-L1: No significant disc bulge. No evidence of neural foraminal stenosis. No central canal stenosis. L1-L2: No significant disc bulge. No right foraminal stenosis. Left neural foramen is obscured by susceptibility artifact. No central canal stenosis. L2-L3: No significant disc bulge. Neural foramina are obscured by susceptibility artifact. No central canal stenosis. L3-L4: Minimal broad-based disc bulge. Moderate bilateral facet arthropathy. Mild right and moderate left foraminal stenosis. No central canal stenosis. L4-L5: Broad-based disc bulge. Moderate bilateral facet arthropathy. Bilateral lateral recess stenosis, left greater than right. No right foraminal stenosis. Mild left foraminal stenosis. No central canal stenosis. L5-S1: Broad-based disc bulge. Moderate bilateral facet arthropathy, left greater than right. Moderate left foraminal stenosis. No right foraminal stenosis. No central canal stenosis. IMPRESSION: 1. Posterior spinal fixation rods are noted transfixing the thoracolumbar spine with susceptibility artifact partially obscuring portions of the posterior elements and central canal. 2. Mild lumbar spine spondylosis as described above. 3. No acute injury of the thoracolumbar spine. 4. 3 x 1.5 cm left upper lobe mass of indeterminate etiology. Differential considerations include pneumonia versus pulmonary neoplasm. Further evaluation with a CT of the chest is recommended. Electronically Signed   By: Kathreen Devoid   On: 11/21/2017 13:03   Ct Renal Stone Study  Result Date: 11/17/2017 CLINICAL DATA:  Back pain for 1 week. Urinary tract infection diagnose today. EXAM: CT ABDOMEN AND PELVIS WITHOUT CONTRAST  TECHNIQUE: Multidetector CT imaging of the abdomen and pelvis was performed following the standard protocol without IV contrast. COMPARISON:  CT abdomen and pelvis 02/15/2015. FINDINGS: Lower chest: Patchy airspace disease is seen in the lower lobes is greater on the left. Heart size is normal. No pneumothorax or pleural effusion. Hepatobiliary: No focal lesion. The liver is low attenuating consistent with fatty infiltration and measures 20 cm craniocaudal. The patient is status post cholecystectomy. Pancreas: Unremarkable. No pancreatic ductal dilatation or surrounding inflammatory changes. Spleen: Normal in size without focal abnormality. Adrenals/Urinary Tract: A nonobstructing stone in midpole  the right kidney measures 1 cm in diameter. No other urinary tract stones are identified. Negative for hydronephrosis. Urinary bladder appears normal. Adrenal glands somewhat poorly seen due to streak artifact from spinal fusion hardware but no obvious abnormality is identified. Stomach/Bowel: Stomach is within normal limits. Appendix appears normal. No evidence of bowel wall thickening, distention, or inflammatory changes. Vascular/Lymphatic: No significant vascular findings are present. No enlarged abdominal or pelvic lymph nodes. Reproductive: Uterus and bilateral adnexa are unremarkable. Other: No fluid collection. Musculoskeletal: No acute or focal abnormality. Thoracolumbar fusion hardware for convex left scoliosis is noted. IMPRESSION: No acute finding abdomen or pelvis. Patchy bibasilar airspace disease is greater on the left and likely due to atelectasis although it could represent pneumonia. 1 cm nonobstructing right renal stone. Fatty infiltration of the liver and hepatomegaly. Electronically Signed   By: Inge Rise M.D.   On: 11/17/2017 10:17   [place] Ekg Site Rite  Result Date: 12/02/2017 If Site Rite image not attached, placement could not be confirmed due to current cardiac  rhythm.   EKG None  Radiology Dg Chest 2 View  Result Date: 12/02/2017 CLINICAL DATA:  Generalized pain. EXAM: CHEST - 2 VIEW COMPARISON:  11/29/2017 and prior radiographs FINDINGS: The cardiomediastinal silhouette is unremarkable. RIGHT basilar opacity noted-question atelectasis versus airspace disease. No pleural effusion or pneumothorax. No acute bony abnormalities noted. IMPRESSION: RIGHT basilar opacity-question atelectasis versus airspace disease. Electronically Signed   By: Margarette Canada M.D.   On: 12/02/2017 14:46    Procedures Procedures (including critical care time)  Medications Ordered in ED Medications  sodium chloride 0.9 % bolus 1,000 mL (has no administration in time range)  HYDROmorphone (DILAUDID) injection 1 mg (has no administration in time range)  ondansetron (ZOFRAN) injection 4 mg (has no administration in time range)     Initial Impression / Assessment and Plan / ED Course  I have reviewed the triage vital signs and the nursing notes.  Pertinent labs & imaging results that were available during my care of the patient were reviewed by me and considered in my medical decision making (see chart for details).  Iv ns. Ns bolus. Labs.   Reviewed nursing notes and prior charts for additional history.   Pt with recent MRI imaging for same symptoms - neg for acute infectious process.  Reviewed nursing notes and prior charts for additional history.   Dilaudid 1 mg iv for pain.   Recheck pain improved.   Pt initially noted subjective fever, diffuse body pain. On re-eval states temp 104 last night. Given ivda hx, blood cultures and additional labs added. Pt did have MR t and l spine previously, no osteo/disciitis noted then.  Xray reviewed - possible pna - similar infiltrate noted on prior imaging. Will rx.   Given gen weakness, sev pain, fevers, possible pna, marked leucocytosis, will admit.  May need echo r/o endocarditis, and additional inpatient workup.    hospitalists consulted for admission.     Final Clinical Impressions(s) / ED Diagnoses   Final diagnoses:  None    ED Discharge Orders    None           Lajean Saver, MD 12/02/17 1529

## 2017-12-02 NOTE — ED Triage Notes (Signed)
Per EMS-last heroin use was yesterday-states pain all over-symptoms have been ongoing for 3-4 weeks-BP 88/60, HR 90

## 2017-12-02 NOTE — ED Notes (Signed)
Attempted 3 [place] IVs. Pt's veins are compromised from years of IV druguse

## 2017-12-03 ENCOUNTER — Inpatient Hospital Stay (HOSPITAL_COMMUNITY): Payer: Medicaid Other

## 2017-12-03 DIAGNOSIS — D72829 Elevated white blood cell count, unspecified: Secondary | ICD-10-CM

## 2017-12-03 DIAGNOSIS — R011 Cardiac murmur, unspecified: Secondary | ICD-10-CM

## 2017-12-03 DIAGNOSIS — I361 Nonrheumatic tricuspid (valve) insufficiency: Secondary | ICD-10-CM

## 2017-12-03 DIAGNOSIS — Z789 Other specified health status: Secondary | ICD-10-CM

## 2017-12-03 DIAGNOSIS — F1410 Cocaine abuse, uncomplicated: Secondary | ICD-10-CM

## 2017-12-03 DIAGNOSIS — R7881 Bacteremia: Secondary | ICD-10-CM

## 2017-12-03 DIAGNOSIS — F1110 Opioid abuse, uncomplicated: Secondary | ICD-10-CM

## 2017-12-03 DIAGNOSIS — F1510 Other stimulant abuse, uncomplicated: Secondary | ICD-10-CM

## 2017-12-03 DIAGNOSIS — Z9889 Other specified postprocedural states: Secondary | ICD-10-CM

## 2017-12-03 DIAGNOSIS — R509 Fever, unspecified: Secondary | ICD-10-CM

## 2017-12-03 DIAGNOSIS — F17210 Nicotine dependence, cigarettes, uncomplicated: Secondary | ICD-10-CM

## 2017-12-03 DIAGNOSIS — B1920 Unspecified viral hepatitis C without hepatic coma: Secondary | ICD-10-CM

## 2017-12-03 DIAGNOSIS — B9561 Methicillin susceptible Staphylococcus aureus infection as the cause of diseases classified elsewhere: Secondary | ICD-10-CM

## 2017-12-03 DIAGNOSIS — N179 Acute kidney failure, unspecified: Secondary | ICD-10-CM

## 2017-12-03 DIAGNOSIS — F1990 Other psychoactive substance use, unspecified, uncomplicated: Secondary | ICD-10-CM

## 2017-12-03 LAB — CBC
HEMATOCRIT: 29.2 % — AB (ref 36.0–46.0)
HEMOGLOBIN: 9.6 g/dL — AB (ref 12.0–15.0)
MCH: 28.9 pg (ref 26.0–34.0)
MCHC: 32.9 g/dL (ref 30.0–36.0)
MCV: 88 fL (ref 78.0–100.0)
Platelets: 358 10*3/uL (ref 150–400)
RBC: 3.32 MIL/uL — AB (ref 3.87–5.11)
RDW: 13.8 % (ref 11.5–15.5)
WBC: 21.9 10*3/uL — ABNORMAL HIGH (ref 4.0–10.5)

## 2017-12-03 LAB — BLOOD CULTURE ID PANEL (REFLEXED)
ACINETOBACTER BAUMANNII: NOT DETECTED
CANDIDA GLABRATA: NOT DETECTED
CANDIDA KRUSEI: NOT DETECTED
CANDIDA TROPICALIS: NOT DETECTED
Candida albicans: NOT DETECTED
Candida parapsilosis: NOT DETECTED
ENTEROCOCCUS SPECIES: NOT DETECTED
ESCHERICHIA COLI: NOT DETECTED
Enterobacter cloacae complex: NOT DETECTED
Enterobacteriaceae species: NOT DETECTED
Haemophilus influenzae: NOT DETECTED
KLEBSIELLA OXYTOCA: NOT DETECTED
Klebsiella pneumoniae: NOT DETECTED
LISTERIA MONOCYTOGENES: NOT DETECTED
Methicillin resistance: NOT DETECTED
Neisseria meningitidis: NOT DETECTED
PROTEUS SPECIES: NOT DETECTED
PSEUDOMONAS AERUGINOSA: NOT DETECTED
SERRATIA MARCESCENS: NOT DETECTED
STAPHYLOCOCCUS SPECIES: DETECTED — AB
Staphylococcus aureus (BCID): DETECTED — AB
Streptococcus agalactiae: NOT DETECTED
Streptococcus pneumoniae: NOT DETECTED
Streptococcus pyogenes: NOT DETECTED
Streptococcus species: NOT DETECTED

## 2017-12-03 LAB — COMPREHENSIVE METABOLIC PANEL
ALBUMIN: 2 g/dL — AB (ref 3.5–5.0)
ALT: 6 U/L (ref 0–44)
ANION GAP: 8 (ref 5–15)
AST: 14 U/L — ABNORMAL LOW (ref 15–41)
Alkaline Phosphatase: 211 U/L — ABNORMAL HIGH (ref 38–126)
BUN: 17 mg/dL (ref 6–20)
CO2: 27 mmol/L (ref 22–32)
Calcium: 8.5 mg/dL — ABNORMAL LOW (ref 8.9–10.3)
Chloride: 98 mmol/L (ref 98–111)
Creatinine, Ser: 0.88 mg/dL (ref 0.44–1.00)
GFR calc non Af Amer: >60 mL/min (ref >60)
GLUCOSE: 109 mg/dL — AB (ref 70–99)
POTASSIUM: 4.2 mmol/L (ref 3.5–5.1)
Sodium: 133 mmol/L — ABNORMAL LOW (ref 135–145)
Total Bilirubin: 0.8 mg/dL (ref 0.3–1.2)
Total Protein: 6.6 g/dL (ref 6.5–8.1)

## 2017-12-03 LAB — HIV ANTIBODY (ROUTINE TESTING W REFLEX): HIV Screen 4th Generation wRfx: NONREACTIVE

## 2017-12-03 LAB — ECHOCARDIOGRAM COMPLETE
HEIGHTINCHES: 67 in
WEIGHTICAEL: 2560 [oz_av]

## 2017-12-03 MED ORDER — CLONIDINE HCL 0.1 MG PO TABS
0.1000 mg | ORAL_TABLET | Freq: Four times a day (QID) | ORAL | Status: AC
  Administered 2017-12-03 – 2017-12-04 (×7): 0.1 mg via ORAL
  Filled 2017-12-03 (×8): qty 1

## 2017-12-03 MED ORDER — CHLORHEXIDINE GLUCONATE 0.12 % MT SOLN
15.0000 mL | Freq: Two times a day (BID) | OROMUCOSAL | Status: DC
  Administered 2017-12-03 – 2017-12-04 (×3): 15 mL via OROMUCOSAL
  Filled 2017-12-03 (×3): qty 15

## 2017-12-03 MED ORDER — METHOCARBAMOL 500 MG PO TABS
500.0000 mg | ORAL_TABLET | Freq: Three times a day (TID) | ORAL | Status: AC | PRN
  Administered 2017-12-03 – 2017-12-08 (×9): 500 mg via ORAL
  Filled 2017-12-03 (×9): qty 1

## 2017-12-03 MED ORDER — CLONIDINE HCL 0.1 MG PO TABS
0.1000 mg | ORAL_TABLET | Freq: Every day | ORAL | Status: AC
  Administered 2017-12-07 – 2017-12-08 (×2): 0.1 mg via ORAL
  Filled 2017-12-03 (×2): qty 1

## 2017-12-03 MED ORDER — NAPROXEN 250 MG PO TABS
500.0000 mg | ORAL_TABLET | Freq: Two times a day (BID) | ORAL | Status: AC | PRN
  Administered 2017-12-03 – 2017-12-05 (×4): 500 mg via ORAL
  Filled 2017-12-03 (×5): qty 2

## 2017-12-03 MED ORDER — KETOROLAC TROMETHAMINE 30 MG/ML IJ SOLN
30.0000 mg | Freq: Four times a day (QID) | INTRAMUSCULAR | Status: AC | PRN
  Administered 2017-12-03 – 2017-12-07 (×13): 30 mg via INTRAVENOUS
  Filled 2017-12-03 (×13): qty 1

## 2017-12-03 MED ORDER — CLONIDINE HCL 0.1 MG PO TABS
0.1000 mg | ORAL_TABLET | ORAL | Status: AC
  Administered 2017-12-05 – 2017-12-06 (×2): 0.1 mg via ORAL
  Filled 2017-12-03 (×2): qty 1

## 2017-12-03 MED ORDER — ONDANSETRON 4 MG PO TBDP
4.0000 mg | ORAL_TABLET | Freq: Four times a day (QID) | ORAL | Status: AC | PRN
  Administered 2017-12-04: 4 mg via ORAL
  Filled 2017-12-03: qty 1

## 2017-12-03 MED ORDER — DICYCLOMINE HCL 20 MG PO TABS
20.0000 mg | ORAL_TABLET | Freq: Four times a day (QID) | ORAL | Status: AC | PRN
  Administered 2017-12-04 (×2): 20 mg via ORAL
  Filled 2017-12-03 (×5): qty 1

## 2017-12-03 MED ORDER — SODIUM CHLORIDE 0.9 % IV SOLN
INTRAVENOUS | Status: DC
  Administered 2017-12-03 – 2017-12-08 (×8): via INTRAVENOUS

## 2017-12-03 MED ORDER — LOPERAMIDE HCL 2 MG PO CAPS: 2.0000 mg | ORAL_CAPSULE | ORAL | Status: AC | PRN

## 2017-12-03 MED ORDER — HYDROXYZINE HCL 25 MG PO TABS
25.0000 mg | ORAL_TABLET | Freq: Four times a day (QID) | ORAL | Status: AC | PRN
  Administered 2017-12-03 – 2017-12-05 (×6): 25 mg via ORAL
  Filled 2017-12-03 (×7): qty 1

## 2017-12-03 MED ORDER — CEFAZOLIN SODIUM-DEXTROSE 2-4 GM/100ML-% IV SOLN
2.0000 g | Freq: Three times a day (TID) | INTRAVENOUS | Status: DC
  Administered 2017-12-03 – 2017-12-05 (×6): 2 g via INTRAVENOUS
  Filled 2017-12-03 (×8): qty 100

## 2017-12-03 MED ORDER — ORAL CARE MOUTH RINSE
15.0000 mL | Freq: Two times a day (BID) | OROMUCOSAL | Status: DC
  Administered 2017-12-03: 15 mL via OROMUCOSAL

## 2017-12-03 NOTE — Consult Note (Addendum)
[facility]    Date of Admission:  12/02/2017   Total days of antibiotics: 1 vanco/zosyn --> ancef               Reason for Consult: S aureus bacteremia    Referring Provider: CHAMP   Assessment: Staph aureus bacteremia IVDA, polysubs abuse (cocaine, heroin, amphetamines)  Prev on methadone Hepatitis C  Plan: 1. Agree with change anbx to ancef 2. Repeat BCx in AM 3. Check TTE, TEE 4. Check Hep C VL,  5. Check Hep A/B Ab 6. Check HIV 7. Screen for STDs (RPR, GC, Chlamydia) 8. Watch for withdrawl 9. Work on her subs abuse needs, methadone  Thank you so much for this interesting consult,  Active Problems:   Cocaine abuse ([place])   Polysubstance abuse ([place])   Sepsis ([place])   Febrile illness   Difficult intravenous access   Leukocytosis   Intravenous drug abuse ([place])   AKI (acute kidney injury) ([place])   . chlorhexidine  15 mL Mouth Rinse BID  . cloNIDine  0.1 mg Oral QID   Followed by  . [START ON 12/05/2017] cloNIDine  0.1 mg Oral BH-qamhs   Followed by  . [START ON 12/07/2017] cloNIDine  0.1 mg Oral QAC breakfast  . docusate sodium  100 mg Oral BID  . guaiFENesin  1,200 mg Oral BID  . heparin  5,000 Units Subcutaneous Q8H  . lactobacillus  1 g Oral TID WC  . mouth rinse  15 mL Mouth Rinse q12n4p  . nicotine  21 mg Transdermal Daily  . ondansetron (ZOFRAN) IV  4 mg Intravenous Once  . sodium chloride flush  3 mL Intravenous Q12H  . sodium chloride flush  3 mL Intravenous Q12H    HPI: Kelly [name] is a 32 y.o. female with hx of active heroin abuse, prev back surgery with prosthesis, comes to ED on 7-22 with c/o f/c, myalgias; found to have tachycardia, hypotension, WBC of 28k. Temp 100.0. She was started on vanco/zosyn (some concern for RUL pna).  Her BCx are now notable for MSSA.  She had CT chest c/i septic emboli.    Review of Systems: Review of Systems  Unable to perform ROS: Critical illness  Constitutional:  Positive for chills and fever.  Musculoskeletal: Positive for myalgias.  lethargic  Past Medical History:  Diagnosis Date  . Abscess of breast    rt breast  . ADD (attention deficit disorder)   . Back pain   . Bipolar 1 disorder ([place])   . Hypertension   . Polysubstance abuse ([place])   . Suicidal overdose ([facility])     Social History   Tobacco Use  . Smoking status: Current Every Day Smoker    Packs/day: 1.00    Years: 13.00    Pack years: 13.00    Types: Cigarettes  . Smokeless tobacco: Never Used  Substance Use Topics  . Alcohol use: Yes    Comment: one or two drinks weekly  . Drug use: Yes    Types: Cocaine, Heroin, IV    Comment: heroin, crack cocaine    Family History  Problem Relation Age of Onset  . Diabetes Mother   . Diabetes Father   . Hypertension Father   . Heart attack Neg Hx   . Hyperlipidemia Neg Hx   . Sudden death Neg Hx      Medications:  Scheduled: . chlorhexidine  15 mL Mouth Rinse BID  .  cloNIDine  0.1 mg Oral QID   Followed by  . [START ON 12/05/2017] cloNIDine  0.1 mg Oral BH-qamhs   Followed by  . [START ON 12/07/2017] cloNIDine  0.1 mg Oral QAC breakfast  . docusate sodium  100 mg Oral BID  . guaiFENesin  1,200 mg Oral BID  . heparin  5,000 Units Subcutaneous Q8H  . lactobacillus  1 g Oral TID WC  . mouth rinse  15 mL Mouth Rinse q12n4p  . nicotine  21 mg Transdermal Daily  . ondansetron (ZOFRAN) IV  4 mg Intravenous Once  . sodium chloride flush  3 mL Intravenous Q12H  . sodium chloride flush  3 mL Intravenous Q12H    Abtx:  Anti-infectives (From admission, onward)   Start     Dose/Rate Route Frequency Ordered Stop   12/03/17 1600  azithromycin (ZITHROMAX) 500 mg in sodium chloride 0.9 % 250 mL IVPB  Status:  Discontinued     500 mg 250 mL/hr over 60 Minutes Intravenous Every 24 hours 12/02/17 1706 12/03/17 0948   12/03/17 1400  ceFAZolin (ANCEF) IVPB 2g/100 mL premix     2 g 200 mL/hr over 30 Minutes Intravenous Every 8 hours  12/03/17 0948     12/03/17 1000  vancomycin (VANCOCIN) IVPB 750 mg/150 ml premix  Status:  Discontinued     750 mg 150 mL/hr over 60 Minutes Intravenous Every 12 hours 12/02/17 1710 12/03/17 0948   12/02/17 2200  piperacillin-tazobactam (ZOSYN) IVPB 3.375 g  Status:  Discontinued     3.375 g 12.5 mL/hr over 240 Minutes Intravenous Every 8 hours 12/02/17 1710 12/03/17 0948   12/02/17 1800  vancomycin (VANCOCIN) 1,500 mg in sodium chloride 0.9 % 500 mL IVPB     1,500 mg 250 mL/hr over 120 Minutes Intravenous  Once 12/02/17 1710 12/02/17 2110   12/02/17 1530  cefTRIAXone (ROCEPHIN) 1 g in sodium chloride 0.9 % 100 mL IVPB     1 g 200 mL/hr over 30 Minutes Intravenous  Once 12/02/17 1522 12/02/17 1626   12/02/17 1530  azithromycin (ZITHROMAX) 500 mg in sodium chloride 0.9 % 250 mL IVPB     500 mg 250 mL/hr over 60 Minutes Intravenous  Once 12/02/17 1522 12/02/17 1759        OBJECTIVE: Blood pressure 102/68, pulse (!) 115, temperature 98.6 F (37 C), temperature source Oral, resp. rate (!) 22, height 5' 7" (1.702 m), weight 72.6 kg (160 lb), last menstrual period 11/11/2017, SpO2 98 %, unknown if currently breastfeeding.  Physical Exam  Constitutional: She appears well-developed. No distress.  HENT:  Head: Normocephalic and atraumatic.  Mouth/Throat: No oropharyngeal exudate.  Eyes: Pupils are equal, round, and reactive to light. EOM are normal.  Neck: Normal range of motion. Neck supple.  Cardiovascular: Normal rate and regular rhythm.  Murmur heard.  Systolic murmur is present with a grade of 3/6. Pulmonary/Chest: Effort normal and breath sounds normal.  Abdominal: Soft. Bowel sounds are normal. She exhibits no distension. There is no tenderness.  Musculoskeletal: She exhibits no edema.  Lymphadenopathy:    She has no cervical adenopathy.  Skin: Skin is warm and dry. She is not diaphoretic.    Lab Results Results for orders placed or performed during the hospital encounter of  12/02/17 (from the past 48 hour(s))  CBC     Status: Abnormal   Collection Time: 12/02/17  1:20 PM  Result Value Ref Range   WBC 28.3 (H) 4.0 - 10.5 K/uL   RBC 3.27 (  L) 3.87 - 5.11 MIL/uL   Hemoglobin 9.5 (L) 12.0 - 15.0 g/dL   HCT 28.7 (L) 36.0 - 46.0 %   MCV 87.8 78.0 - 100.0 fL   MCH 29.1 26.0 - 34.0 pg   MCHC 33.1 30.0 - 36.0 g/dL   RDW 13.8 11.5 - 15.5 %   Platelets 402 (H) 150 - 400 K/uL    Comment: Performed at [facility], [address] [place], [place] [address]  Comprehensive metabolic panel     Status: Abnormal   Collection Time: 12/02/17  1:20 PM  Result Value Ref Range   Sodium 130 (L) 135 - 145 mmol/L   Potassium 3.6 3.5 - 5.1 mmol/L   Chloride 90 (L) 98 - 111 mmol/L   CO2 27 22 - 32 mmol/L   Glucose, Bld 113 (H) 70 - 99 mg/dL   BUN 23 (H) 6 - 20 mg/dL   Creatinine, Ser 1.04 (H) 0.44 - 1.00 mg/dL   Calcium 8.9 8.9 - 10.3 mg/dL   Total Protein 7.2 6.5 - 8.1 g/dL   Albumin 2.4 (L) 3.5 - 5.0 g/dL   AST 13 (L) 15 - 41 U/L   ALT 6 0 - 44 U/L   Alkaline Phosphatase 168 (H) 38 - 126 U/L   Total Bilirubin 1.0 0.3 - 1.2 mg/dL   GFR calc non Af Amer >60 >60 mL/min   GFR calc Af Amer >60 >60 mL/min    Comment: (NOTE) The eGFR has been calculated using the CKD EPI equation. This calculation has not been validated in all clinical situations. eGFR's persistently <60 mL/min signify possible Chronic Kidney Disease.    Anion gap 13 5 - 15    Comment: Performed at [facility], [address] [place], [place] [address]  Sedimentation rate     Status: Abnormal   Collection Time: 12/02/17  1:20 PM  Result Value Ref Range   Sed Rate 136 (H) 0 - 22 mm/hr    Comment: Performed at [facility], [address] [place], [place] [address]  Blood culture (routine x 2)     Status: None (Preliminary result)   Collection Time: 12/02/17  2:55 PM  Result Value Ref Range   Specimen Description      BLOOD LEFT FOOT Performed  at [facility], [address] [place], [place] [address]    Special Requests      BOTTLES DRAWN AEROBIC AND ANAEROBIC Blood Culture results may not be optimal due to an excessive volume of blood received in culture bottles Performed at  [facility], [address] [place], [place] [address]    Culture  Setup Time      IN BOTH AEROBIC AND ANAEROBIC BOTTLES GRAM POSITIVE COCCI Organism ID to follow CRITICAL RESULT CALLED TO, READ BACK BY AND VERIFIED WITH: B GREEN PHARMD 12/03/17 0532 JDW    Culture      NO GROWTH < 24 HOURS Performed at [facility], [address] , [place] [address]    Report Status PENDING   Blood Culture ID Panel (Reflexed)     Status: Abnormal   Collection Time: 12/02/17  2:55 PM  Result Value Ref Range   Enterococcus species NOT DETECTED NOT DETECTED   Listeria monocytogenes NOT DETECTED NOT DETECTED   Staphylococcus species DETECTED (A) NOT DETECTED    Comment: CRITICAL RESULT CALLED TO, READ BACK BY AND VERIFIED WITH: B GREEN PHARMD 12/03/17 0532 JDW      Staphylococcus aureus DETECTED (A) NOT DETECTED    Comment: Methicillin (oxacillin) susceptible Staphylococcus aureus (MSSA). Preferred therapy is anti staphylococcal beta lactam antibiotic (Cefazolin or Nafcillin), unless clinically contraindicated. CRITICAL RESULT CALLED TO, READ BACK BY AND VERIFIED WITH: B GREEN PHARMD 12/03/17 0532 JDW    Methicillin resistance NOT DETECTED NOT DETECTED   Streptococcus species NOT DETECTED NOT DETECTED   Streptococcus agalactiae NOT DETECTED NOT DETECTED   Streptococcus pneumoniae NOT DETECTED NOT DETECTED   Streptococcus pyogenes NOT DETECTED NOT DETECTED   Acinetobacter baumannii NOT DETECTED NOT DETECTED   Enterobacteriaceae species NOT DETECTED NOT DETECTED   Enterobacter cloacae complex NOT DETECTED NOT DETECTED   Escherichia coli NOT DETECTED NOT DETECTED   Klebsiella oxytoca NOT DETECTED NOT DETECTED    Klebsiella pneumoniae NOT DETECTED NOT DETECTED   Proteus species NOT DETECTED NOT DETECTED   Serratia marcescens NOT DETECTED NOT DETECTED   Haemophilus influenzae NOT DETECTED NOT DETECTED   Neisseria meningitidis NOT DETECTED NOT DETECTED   Pseudomonas aeruginosa NOT DETECTED NOT DETECTED   Candida albicans NOT DETECTED NOT DETECTED   Candida glabrata NOT DETECTED NOT DETECTED   Candida krusei NOT DETECTED NOT DETECTED   Candida parapsilosis NOT DETECTED NOT DETECTED   Candida tropicalis NOT DETECTED NOT DETECTED    Comment: Performed at [facility], [place] [address], [place], [place] [address]  I-Stat CG4 Lactic Acid, ED     Status: None   Collection Time: 12/02/17  3:03 PM  Result Value Ref Range   Lactic Acid, Venous 0.97 0.5 - 1.9 mmol/L  Blood culture (routine x 2)     Status: None (Preliminary result)   Collection Time: 12/02/17  3:57 PM  Result Value Ref Range   Specimen Description      BLOOD RIGHT ARM Performed at [facility] [address], [place], [place] [address]    Special Requests      BOTTLES DRAWN AEROBIC AND ANAEROBIC Blood Culture adequate volume Performed at [facility] [address] [place], [place] [address]    Culture  Setup Time      IN BOTH AEROBIC AND ANAEROBIC BOTTLES GRAM POSITIVE COCCI CRITICAL VALUE NOTED.  VALUE IS CONSISTENT WITH PREVIOUSLY REPORTED AND CALLED VALUE.    Culture      NO GROWTH < 24 HOURS Performed at [facility], [place] [address], [place], [place] [address]    Report Status PENDING   MRSA PCR Screening     Status: None   Collection Time: 12/02/17  6:20 PM  Result Value Ref Range   MRSA by PCR NEGATIVE NEGATIVE    Comment:        The GeneXpert MRSA Assay (FDA approved for NASAL specimens only), is one component of a comprehensive MRSA colonization surveillance program. It is not intended to diagnose MRSA infection nor to guide or monitor treatment for MRSA  infections. Performed at [facility], [place] [address] [place], [place] [address]   Urinalysis, Routine w reflex microscopic     Status: Abnormal   Collection Time: 12/02/17  6:32 PM  Result Value Ref Range   Color, Urine AMBER (A) YELLOW    Comment: BIOCHEMICALS MAY BE AFFECTED BY COLOR   APPearance HAZY (A) CLEAR   Specific Gravity, Urine 1.014 1.005 - 1.030   pH 5.0 5.0 - 8.0   Glucose, UA NEGATIVE NEGATIVE mg/dL   Hgb urine dipstick LARGE (A) NEGATIVE   Bilirubin Urine NEGATIVE NEGATIVE  Ketones, ur NEGATIVE NEGATIVE mg/dL   Protein, ur 30 (A) NEGATIVE mg/dL   Nitrite NEGATIVE NEGATIVE   Leukocytes, UA MODERATE (A) NEGATIVE   RBC / HPF >50 (H) 0 - 5 RBC/hpf   WBC, UA 21-50 0 - 5 WBC/hpf   Bacteria, UA RARE (A) NONE SEEN   Squamous Epithelial / LPF 21-50 0 - 5   Mucus PRESENT     Comment: Performed at [facility], [place] [address], [place], [place] [address]  HIV antibody (Routine Testing)     Status: None   Collection Time: 12/02/17  6:51 PM  Result Value Ref Range   HIV Screen 4th Generation wRfx Non Reactive Non Reactive    Comment: (NOTE) Performed At: [facility] [place], [place] [id number] Rush Farmer MD [id number]   Comprehensive metabolic panel     Status: Abnormal   Collection Time: 12/03/17  3:27 AM  Result Value Ref Range   Sodium 133 (L) 135 - 145 mmol/L   Potassium 4.2 3.5 - 5.1 mmol/L   Chloride 98 98 - 111 mmol/L   CO2 27 22 - 32 mmol/L   Glucose, Bld 109 (H) 70 - 99 mg/dL   BUN 17 6 - 20 mg/dL   Creatinine, Ser 0.88 0.44 - 1.00 mg/dL   Calcium 8.5 (L) 8.9 - 10.3 mg/dL   Total Protein 6.6 6.5 - 8.1 g/dL   Albumin 2.0 (L) 3.5 - 5.0 g/dL   AST 14 (L) 15 - 41 U/L   ALT 6 0 - 44 U/L   Alkaline Phosphatase 211 (H) 38 - 126 U/L   Total Bilirubin 0.8 0.3 - 1.2 mg/dL   GFR calc non Af Amer >60 >60 mL/min   GFR calc Af Amer >60 >60 mL/min    Comment: (NOTE) The eGFR has been calculated  using the CKD EPI equation. This calculation has not been validated in all clinical situations. eGFR's persistently <60 mL/min signify possible Chronic Kidney Disease.    Anion gap 8 5 - 15    Comment: Performed at [facility], [place] [address], [place], [place] [address]  CBC     Status: Abnormal   Collection Time: 12/03/17  3:27 AM  Result Value Ref Range   WBC 21.9 (H) 4.0 - 10.5 K/uL   RBC 3.32 (L) 3.87 - 5.11 MIL/uL   Hemoglobin 9.6 (L) 12.0 - 15.0 g/dL   HCT 29.2 (L) 36.0 - 46.0 %   MCV 88.0 78.0 - 100.0 fL   MCH 28.9 26.0 - 34.0 pg   MCHC 32.9 30.0 - 36.0 g/dL   RDW 13.8 11.5 - 15.5 %   Platelets 358 150 - 400 K/uL    Comment: Performed at [facility], [place] [address] [place], [place] [address]      Component Value Date/Time   SDES  12/02/2017 1557    BLOOD RIGHT ARM Performed at [facility], [place] [address], [place], [place] [address]    SPECREQUEST  12/02/2017 1557    BOTTLES DRAWN AEROBIC AND ANAEROBIC Blood Culture adequate volume Performed at [facility] [address], [place], [place] [address]    CULT  12/02/2017 1557    NO GROWTH < 24 HOURS Performed at [facility] [address] [place], [place] [address]    REPTSTATUS PENDING 12/02/2017 1557   Dg Chest 2 View  Result Date: 12/02/2017 CLINICAL DATA:  Generalized pain. EXAM: CHEST - 2  VIEW COMPARISON:  11/29/2017 and prior radiographs FINDINGS: The cardiomediastinal silhouette is unremarkable. RIGHT basilar opacity noted-question atelectasis versus airspace disease. No pleural effusion or pneumothorax. No acute bony abnormalities noted. IMPRESSION: RIGHT basilar opacity-question atelectasis versus airspace disease. Electronically Signed   By: Margarette [place] M.D.   On: 12/02/2017 14:46   Ct Chest W Contrast  Result Date: 12/02/2017 CLINICAL DATA:  Pneumonia, fever. History of IV drug abuse. Leukocytosis. EXAM: CT CHEST,  ABDOMEN, AND PELVIS WITH CONTRAST TECHNIQUE: Multidetector CT imaging of the chest, abdomen and pelvis was performed following the standard protocol during bolus administration of intravenous contrast. CONTRAST:  144m ISOVUE-300 IOPAMIDOL (ISOVUE-300) INJECTION 61% COMPARISON:  11/17/2017. FINDINGS: CT CHEST FINDINGS Cardiovascular: Heart size is normal. No pericardial effusion. Normal 3 vessel aortic branching pattern. Normal appearance of the aorta. Mediastinum/Nodes: No enlarged mediastinal, hilar, or axillary lymph nodes. Thyroid gland, trachea, and esophagus demonstrate no significant findings. Lungs/Pleura: There is multifocal consolidation throughout both lungs, worst in the right middle lobe and both lower lobes. There are cavitary lesions in the anterior right upper lobe and within the left upper lobe. No pleural effusion or pneumothorax. Airways are patent. Musculoskeletal: There are bilateral spinal rods extending from T4-L3. No lytic or blastic lesions. No evidence of discitis-osteomyelitis. CT ABDOMEN PELVIS FINDINGS HEPATOBILIARY: Normal hepatic contours and density. No intra- or extrahepatic biliary dilatation. Status post cholecystectomy. PANCREAS: Normal parenchymal contours without ductal dilatation. No peripancreatic fluid collection. SPLEEN: Normal. ADRENALS/URINARY TRACT: --Adrenal glands: Normal. --Right kidney/ureter: Linear, nonobstructive interpolar calculus measures up to 13 mm. --Left kidney/ureter: No hydronephrosis, nephroureterolithiasis, perinephric stranding or solid renal mass. --Urinary bladder: Normal for degree of distention STOMACH/BOWEL: --Stomach/Duodenum: No hiatal hernia or other gastric abnormality. Normal duodenal course. --Small bowel: No dilatation or inflammation. --Colon: No focal abnormality. --Appendix: Normal. VASCULAR/LYMPHATIC: Normal course and caliber of the major abdominal vessels. No abdominal or pelvic lymphadenopathy. REPRODUCTIVE: No free fluid in the  pelvis. MUSCULOSKELETAL. No bony spinal canal stenosis or focal osseous abnormality. OTHER: None. IMPRESSION: 1. Multifocal consolidation within both lungs, including multiple cavitary lesions, consistent with pneumonia secondary to septic emboli. 2. No other acute abnormality of the chest, abdomen or pelvis. Electronically Signed   By: KUlyses JarredM.D.   On: 12/02/2017 18:23   Ct Abdomen Pelvis W Contrast  Result Date: 12/02/2017 CLINICAL DATA:  Pneumonia, fever. History of IV drug abuse. Leukocytosis. EXAM: CT CHEST, ABDOMEN, AND PELVIS WITH CONTRAST TECHNIQUE: Multidetector CT imaging of the chest, abdomen and pelvis was performed following the standard protocol during bolus administration of intravenous contrast. CONTRAST:  1057mISOVUE-300 IOPAMIDOL (ISOVUE-300) INJECTION 61% COMPARISON:  11/17/2017. FINDINGS: CT CHEST FINDINGS Cardiovascular: Heart size is normal. No pericardial effusion. Normal 3 vessel aortic branching pattern. Normal appearance of the aorta. Mediastinum/Nodes: No enlarged mediastinal, hilar, or axillary lymph nodes. Thyroid gland, trachea, and esophagus demonstrate no significant findings. Lungs/Pleura: There is multifocal consolidation throughout both lungs, worst in the right middle lobe and both lower lobes. There are cavitary lesions in the anterior right upper lobe and within the left upper lobe. No pleural effusion or pneumothorax. Airways are patent. Musculoskeletal: There are bilateral spinal rods extending from T4-L3. No lytic or blastic lesions. No evidence of discitis-osteomyelitis. CT ABDOMEN PELVIS FINDINGS HEPATOBILIARY: Normal hepatic contours and density. No intra- or extrahepatic biliary dilatation. Status post cholecystectomy. PANCREAS: Normal parenchymal contours without ductal dilatation. No peripancreatic fluid collection. SPLEEN: Normal. ADRENALS/URINARY TRACT: --Adrenal glands: Normal. --Right kidney/ureter: Linear, nonobstructive interpolar calculus measures up  to 13 mm. --Left kidney/ureter:  No hydronephrosis, nephroureterolithiasis, perinephric stranding or solid renal mass. --Urinary bladder: Normal for degree of distention STOMACH/BOWEL: --Stomach/Duodenum: No hiatal hernia or other gastric abnormality. Normal duodenal course. --Small bowel: No dilatation or inflammation. --Colon: No focal abnormality. --Appendix: Normal. VASCULAR/LYMPHATIC: Normal course and caliber of the major abdominal vessels. No abdominal or pelvic lymphadenopathy. REPRODUCTIVE: No free fluid in the pelvis. MUSCULOSKELETAL. No bony spinal canal stenosis or focal osseous abnormality. OTHER: None. IMPRESSION: 1. Multifocal consolidation within both lungs, including multiple cavitary lesions, consistent with pneumonia secondary to septic emboli. 2. No other acute abnormality of the chest, abdomen or pelvis. Electronically Signed   By: Kevin  Herman M.D.   On: 12/02/2017 18:23   Us Ekg Site Rite  Result Date: 12/02/2017 If Site Rite image not attached, placement could not be confirmed due to current cardiac rhythm.  Recent Results (from the past 240 hour(s))  Blood culture (routine x 2)     Status: None (Preliminary result)   Collection Time: 12/02/17  2:55 PM  Result Value Ref Range Status   Specimen Description   Final    BLOOD LEFT FOOT Performed at [facility], [address] [place], [place] [address]    Special Requests   Final    BOTTLES DRAWN AEROBIC AND ANAEROBIC Blood Culture results may not be optimal due to an excessive volume of blood received in culture bottles Performed at [facility], [address] [place], [place] [address]    Culture  Setup Time   Final    IN BOTH AEROBIC AND ANAEROBIC BOTTLES GRAM POSITIVE COCCI Organism ID to follow CRITICAL RESULT CALLED TO, READ BACK BY AND VERIFIED WITH: B GREEN PHARMD 12/03/17 0532 JDW    Culture   Final    NO GROWTH < 24 HOURS Performed at [facility], [address] [place], [place] [address]    Report Status PENDING  Incomplete  Blood Culture ID Panel (Reflexed)     Status: Abnormal   Collection Time: 12/02/17  2:55 PM  Result Value Ref Range Status   Enterococcus species NOT DETECTED NOT DETECTED Final   Listeria monocytogenes NOT DETECTED NOT DETECTED Final   Staphylococcus species DETECTED (A) NOT DETECTED Final    Comment: CRITICAL RESULT CALLED TO, READ BACK BY AND VERIFIED WITH: B GREEN PHARMD 12/03/17 0532 JDW    Staphylococcus aureus DETECTED (A) NOT DETECTED Final    Comment: Methicillin (oxacillin) susceptible Staphylococcus aureus (MSSA). Preferred therapy is anti staphylococcal beta lactam antibiotic (Cefazolin or Nafcillin), unless clinically contraindicated. CRITICAL RESULT CALLED TO, READ BACK BY AND VERIFIED WITH: B GREEN PHARMD 12/03/17 0532 JDW    Methicillin resistance NOT DETECTED NOT DETECTED Final   Streptococcus species NOT DETECTED NOT DETECTED Final   Streptococcus agalactiae NOT DETECTED NOT DETECTED Final   Streptococcus pneumoniae NOT DETECTED NOT DETECTED Final   Streptococcus pyogenes NOT DETECTED NOT DETECTED Final   Acinetobacter baumannii NOT DETECTED NOT DETECTED Final   Enterobacteriaceae species NOT DETECTED NOT DETECTED Final   Enterobacter cloacae complex NOT DETECTED NOT DETECTED Final   Escherichia coli NOT DETECTED NOT DETECTED Final   Klebsiella oxytoca NOT DETECTED NOT DETECTED Final   Klebsiella pneumoniae NOT DETECTED NOT DETECTED Final   Proteus species NOT DETECTED NOT DETECTED Final   Serratia marcescens NOT DETECTED NOT DETECTED Final   Haemophilus influenzae NOT DETECTED NOT DETECTED Final   Neisseria meningitidis NOT DETECTED NOT DETECTED Final   Pseudomonas aeruginosa NOT DETECTED NOT DETECTED Final     Candida albicans NOT DETECTED NOT DETECTED Final   Candida glabrata NOT DETECTED NOT DETECTED Final   Candida krusei NOT DETECTED NOT DETECTED Final   Candida parapsilosis NOT DETECTED NOT  DETECTED Final   Candida tropicalis NOT DETECTED NOT DETECTED Final    Comment: Performed at [facility], [place] [address] [place], [place] [address]  Blood culture (routine x 2)     Status: None (Preliminary result)   Collection Time: 12/02/17  3:57 PM  Result Value Ref Range Status   Specimen Description   Final    BLOOD RIGHT ARM Performed at [facility] [address] [place], [place] [address]    Special Requests   Final    BOTTLES DRAWN AEROBIC AND ANAEROBIC Blood Culture adequate volume Performed at [facility] [address] [place], [place] [address]    Culture  Setup Time   Final    IN BOTH AEROBIC AND ANAEROBIC BOTTLES GRAM POSITIVE COCCI CRITICAL VALUE NOTED.  VALUE IS CONSISTENT WITH PREVIOUSLY REPORTED AND CALLED VALUE.    Culture   Final    NO GROWTH < 24 HOURS Performed at [facility], [name] [address], [place], [place] [address]    Report Status PENDING  Incomplete  MRSA PCR Screening     Status: None   Collection Time: 12/02/17  6:20 PM  Result Value Ref Range Status   MRSA by PCR NEGATIVE NEGATIVE Final    Comment:        The GeneXpert MRSA Assay (FDA approved for NASAL specimens only), is one component of a comprehensive MRSA colonization surveillance program. It is not intended to diagnose MRSA infection nor to guide or monitor treatment for MRSA infections. Performed at [facility], [place] [address] [place], [place] [address]     Microbiology: Recent Results (from the past 240 hour(s))  Blood culture (routine x 2)     Status: None (Preliminary result)   Collection Time: 12/02/17  2:55 PM  Result Value Ref Range Status   Specimen Description   Final    BLOOD LEFT FOOT Performed at [facility], [place] [address], [place], [place] [address]    Special Requests   Final    BOTTLES DRAWN AEROBIC AND ANAEROBIC Blood Culture results may not be optimal due to an  excessive volume of blood received in culture bottles Performed at [facility] [address], [place], [place] [address]    Culture  Setup Time   Final    IN BOTH AEROBIC AND ANAEROBIC BOTTLES GRAM POSITIVE COCCI Organism ID to follow CRITICAL RESULT CALLED TO, READ BACK BY AND VERIFIED WITH: B GREEN PHARMD 12/03/17 0532 JDW    Culture   Final    NO GROWTH < 24 HOURS Performed at [facility], [place] [address], [place], [place] [address]    Report Status PENDING  Incomplete  Blood Culture ID Panel (Reflexed)     Status: Abnormal   Collection Time: 12/02/17  2:55 PM  Result Value Ref Range Status   Enterococcus species NOT DETECTED NOT DETECTED Final   Listeria monocytogenes NOT DETECTED NOT DETECTED Final   Staphylococcus species DETECTED (A) NOT DETECTED Final    Comment: CRITICAL RESULT CALLED TO, READ BACK BY AND VERIFIED WITH: B GREEN PHARMD 12/03/17 0532 JDW    Staphylococcus aureus DETECTED (A) NOT DETECTED Final    Comment: Methicillin (oxacillin) susceptible Staphylococcus aureus (MSSA). Preferred therapy is  anti staphylococcal beta lactam antibiotic (Cefazolin or Nafcillin), unless clinically contraindicated. CRITICAL RESULT CALLED TO, READ BACK BY AND VERIFIED WITH: B GREEN PHARMD 12/03/17 0532 JDW    Methicillin resistance NOT DETECTED NOT DETECTED Final   Streptococcus species NOT DETECTED NOT DETECTED Final   Streptococcus agalactiae NOT DETECTED NOT DETECTED Final   Streptococcus pneumoniae NOT DETECTED NOT DETECTED Final   Streptococcus pyogenes NOT DETECTED NOT DETECTED Final   Acinetobacter baumannii NOT DETECTED NOT DETECTED Final   Enterobacteriaceae species NOT DETECTED NOT DETECTED Final   Enterobacter cloacae complex NOT DETECTED NOT DETECTED Final   Escherichia coli NOT DETECTED NOT DETECTED Final   Klebsiella oxytoca NOT DETECTED NOT DETECTED Final   Klebsiella pneumoniae NOT DETECTED NOT DETECTED Final   Proteus species NOT  DETECTED NOT DETECTED Final   Serratia marcescens NOT DETECTED NOT DETECTED Final   Haemophilus influenzae NOT DETECTED NOT DETECTED Final   Neisseria meningitidis NOT DETECTED NOT DETECTED Final   Pseudomonas aeruginosa NOT DETECTED NOT DETECTED Final   Candida albicans NOT DETECTED NOT DETECTED Final   Candida glabrata NOT DETECTED NOT DETECTED Final   Candida krusei NOT DETECTED NOT DETECTED Final   Candida parapsilosis NOT DETECTED NOT DETECTED Final   Candida tropicalis NOT DETECTED NOT DETECTED Final    Comment: Performed at [facility], [address] [place], [place] [address]  Blood culture (routine x 2)     Status: None (Preliminary result)   Collection Time: 12/02/17  3:57 PM  Result Value Ref Range Status   Specimen Description   Final    BLOOD RIGHT ARM Performed at [facility], [address] [place], [place] [address]    Special Requests   Final    BOTTLES DRAWN AEROBIC AND ANAEROBIC Blood Culture adequate volume Performed at [facility] [facility], [address] [place], [place] [address]    Culture  Setup Time   Final    IN BOTH AEROBIC AND ANAEROBIC BOTTLES GRAM POSITIVE COCCI CRITICAL VALUE NOTED.  VALUE IS CONSISTENT WITH PREVIOUSLY REPORTED AND CALLED VALUE.    Culture   Final    NO GROWTH < 24 HOURS Performed at [facility], [address] [place], [place] [address]    Report Status PENDING  Incomplete  MRSA PCR Screening     Status: None   Collection Time: 12/02/17  6:20 PM  Result Value Ref Range Status   MRSA by PCR NEGATIVE NEGATIVE Final    Comment:        The GeneXpert MRSA Assay (FDA approved for NASAL specimens only), is one component of a comprehensive MRSA colonization surveillance program. It is not intended to diagnose MRSA infection nor to guide or monitor treatment for MRSA infections. Performed at [facility], [address] [place], [place] [address]      Radiographs and labs were personally reviewed by me.        [facility] Staphylococcus aureus bacteremia   Staphylococcus aureus bacteremia (SAB) is associated with a high rate of complications and mortality.  Specific aspects of clinical management are critical to optimizing the outcome of patients with SAB.  Therefore, the [facility] ([facility]) has initiated an intervention aimed at improving the management of SAB at [place].  To do so, Infectious Diseases physicians are providing an evidence-based consult for the management of all patients with SAB.     Yes No Comments  Perform follow-up   blood cultures (even if the patient is afebrile) to ensure clearance of bacteremia [x] []   Remove vascular catheter and obtain follow-up blood cultures after the removal of the catheter [] []   Perform echocardiography to evaluate for endocarditis (transthoracic ECHO is 40-50% sensitive, TEE is > 90% sensitive) [x] [] Please keep in mind, that neither test can definitively EXCLUDE endocarditis, and that should clinical suspicion remain high for endocarditis the patient should then still be treated with an "endocarditis" duration of therapy = 6 weeks  Consult electrophysiologist to evaluate implanted cardiac device (pacemaker, ICD) [] []   Ensure source control [] [] Have all abscesses been drained effectively? Have deep seeded infections (septic joints or osteomyelitis) had appropriate surgical debridement?  Investigate for "metastatic" sites of infection [] [] Does the patient have ANY symptom or physical exam finding that would suggest a deeper infection (back or neck pain that may be suggestive of vertebral osteomyelitis or epidural abscess, muscle pain that could be a symptom of pyomyositis)?  Keep in mind that for deep seeded infections MRI imaging with contrast is preferred rather than other often insensitive tests such as plain x-rays,  especially early in a patient's presentation.  Change antibiotic therapy to __________________ [x] [] Beta-lactam antibiotics are preferred for MSSA due to higher cure rates.   If on Vancomycin, goal trough should be 15 - 20 mcg/mL  Estimated duration of IV antibiotic therapy:   [] [] Consult case management for probably prolonged outpatient IV antibiotic therapy     , MD FACP [facility] [phone] 12/03/2017, 3:42 PM  

## 2017-12-03 NOTE — Progress Notes (Signed)
PHARMACY - PHYSICIAN COMMUNICATION CRITICAL VALUE ALERT - BLOOD CULTURE IDENTIFICATION (BCID)  Kelly [name] is an 32 y.o. female who presented to [name] on 12/02/2017 with a chief complaint of sepsis  Assessment:  ? PNA, bacteremia, intra-abdominal infection (include suspected source if known) 2 of 4 bottles + Staph Aureus (methicillin resistance not detected)  Will likely be all 4 bottles when micro lab is finished.   Name of physician (or Provider) Contacted: Lamar Blinks, NP  Current antibiotics: Zosyn and Vancomycin  Changes to prescribed antibiotics recommended:  Response not received from provider;  current antibiotics are likely to cover the isolated organism.  Consider de-escalation soon.  Narrow to Ancef 2 gm IV q8h when clinically appropriate  Results for orders placed or performed during the hospital encounter of 12/02/17  Blood Culture ID Panel (Reflexed) (Collected: 12/02/2017  2:55 PM)  Result Value Ref Range   Enterococcus species NOT DETECTED NOT DETECTED   Listeria monocytogenes NOT DETECTED NOT DETECTED   Staphylococcus species DETECTED (A) NOT DETECTED   Staphylococcus aureus DETECTED (A) NOT DETECTED   Methicillin resistance NOT DETECTED NOT DETECTED   Streptococcus species NOT DETECTED NOT DETECTED   Streptococcus agalactiae NOT DETECTED NOT DETECTED   Streptococcus pneumoniae NOT DETECTED NOT DETECTED   Streptococcus pyogenes NOT DETECTED NOT DETECTED   Acinetobacter baumannii NOT DETECTED NOT DETECTED   Enterobacteriaceae species NOT DETECTED NOT DETECTED   Enterobacter cloacae complex NOT DETECTED NOT DETECTED   Escherichia coli NOT DETECTED NOT DETECTED   Klebsiella oxytoca NOT DETECTED NOT DETECTED   Klebsiella pneumoniae NOT DETECTED NOT DETECTED   Proteus species NOT DETECTED NOT DETECTED   Serratia marcescens NOT DETECTED NOT DETECTED   Haemophilus influenzae NOT DETECTED NOT DETECTED   Neisseria meningitidis NOT DETECTED NOT DETECTED   Pseudomonas aeruginosa NOT DETECTED NOT DETECTED   Candida albicans NOT DETECTED NOT DETECTED   Candida glabrata NOT DETECTED NOT DETECTED   Candida krusei NOT DETECTED NOT DETECTED   Candida parapsilosis NOT DETECTED NOT DETECTED   Candida tropicalis NOT DETECTED NOT DETECTED    Dorrene German 12/03/2017  5:41 AM

## 2017-12-03 NOTE — Progress Notes (Signed)
PROGRESS NOTE    VIOLETA LECOUNT  WNI:[id number] DOB: Dec 27, 1985 DOA: 12/02/2017 PCP: Patient, No Pcp Per   Brief Narrative:32 y.o. female, past medical history of IV drug abuse, ADD, history of scoliosis, with back surgery and fixing rods, taken with multiple complaints, reported fever and chills at home, reported generalized body ache, she had ED visit 7/11 with similar complaints, she had no leukocytosis then, negative urine analysis, she had MRI of her thoracolumbar spine with no evidence of infection, port worsening symptoms, generalized body ache, she does report some cough, nonproductive, she was afebrile in ED, but had leukocytosis of 28,000, she denies any headache, nausea, vomiting, dysuria, sore throat, no focal deficits, lower extremity weakness, urine or stool incontinence, x-ray was significant for right lower opacity, started on Rocephin and azithromycin, called to admit, she was noted to have murmur, she reports IV drug abuse, most recently yesterday for heroin, reports she is on methadone clinic 80 mg daily,(awaiting those confirmation from [facility]).     Assessment & Plan:   Active Problems:   Cocaine abuse (HCC)   Polysubstance abuse (HCC)   Sepsis ([place])  1] sepsis secondary to infective endocarditis and pneumonia patient with history of IV drug abuse.  Upon admission patient was tachycardic tachypneic and hypotensive with leukocytosis which is improved.  Patient was initially treated with vancomycin and Zosyn then changed to Ancef she has blood cultures positive for staph species MSSA.  Currently only on Ancef Vanco and Zosyn DC'd.  MRSA PCR negative.  Echocardiogram showsLVEF 60-65% Severe tricuspid regurgitation. 1.7 cm x 1.0 cm likely vegetation on the septal leaflet of the tricuspid valve consistent with endocarditis. Thickening of the mitral valve leaflet.  Cannot rule out endocarditis. Trivial MR Normal diastolic function No IVC  dilatation. Severe RA and mild LA enlargement  Trish  informed about arranging a TEE for tomorrow. Follow-up daily labs. Her blood pressure is still soft continue IV fluids.  2] polysubstance abuse recent urine drug screen 10 days ago showed amphetamine cocaine and opiates.  Patient used to get methadone from [facility] but have not been there for many weeks.  Will place her on clonidine detox protocol continue morphine and oxycodone for pain control.  HIV negative.  DVT prophylaxis: Heparin Code Status full code Family Communication: Discussed with patient and mother Disposition Plan TBD  Consultants: Consulted cardiology for TEE   Procedures: None Antimicrobials: Ancef  Subjective: Patient is awake alert complaining of not feeling well denies any nausea vomiting diarrhea constipation no chest pain.  Objective: Vitals:   12/03/17 0700 12/03/17 0740 12/03/17 0800 12/03/17 1150  BP: (!) 82/56  (!) 105/59   Pulse:      Resp: 12  20   Temp:  97.9 F (36.6 C)  98.6 F (37 C)  TempSrc:  Oral  Oral  SpO2: 96%  97%   Weight:      Height:        Intake/Output Summary (Last 24 hours) at 12/03/2017 1347 Last data filed at 12/03/2017 0805 Gross per 24 hour  Intake 1443.12 ml  Output 1100 ml  Net 343.12 ml   Filed Weights   12/02/17 1135  Weight: 72.6 kg (160 lb)    Examination:  General exam: Appears calm and comfortable  Respiratory system: Clear to auscultation. Respiratory effort normal. Cardiovascular system: S1 & S2 heard, RRR. No JVD systolic  murmurs, gallops or clicks. No pedal edema. Gastrointestinal system: Abdomen is nondistended, soft and nontender. No organomegaly or  masses felt. Normal bowel sounds heard. Central nervous system: Alert and oriented. No focal neurological deficits. Extremities: Symmetric 5 x 5 power. Skin: No rashes, lesions or ulcers Psychiatry: Judgement and insight appear normal. Mood & affect appropriate.     Data Reviewed: I  have personally reviewed following labs and imaging studies  CBC: Recent Labs  Lab 12/02/17 1320 12/03/17 0327  WBC 28.3* 21.9*  HGB 9.5* 9.6*  HCT 28.7* 29.2*  MCV 87.8 88.0  PLT 402* 573   Basic Metabolic Panel: Recent Labs  Lab 12/02/17 1320 12/03/17 0327  NA 130* 133*  K 3.6 4.2  CL 90* 98  CO2 27 27  GLUCOSE 113* 109*  BUN 23* 17  CREATININE 1.04* 0.88  CALCIUM 8.9 8.5*   GFR: Estimated Creatinine Clearance: 88.4 mL/min (by C-G formula based on SCr of 0.88 mg/dL). Liver Function Tests: Recent Labs  Lab 12/02/17 1320 12/03/17 0327  AST 13* 14*  ALT 6 6  ALKPHOS 168* 211*  BILITOT 1.0 0.8  PROT 7.2 6.6  ALBUMIN 2.4* 2.0*   No results for input(s): LIPASE, AMYLASE in the last 168 hours. No results for input(s): AMMONIA in the last 168 hours. Coagulation Profile: No results for input(s): INR, PROTIME in the last 168 hours. Cardiac Enzymes: No results for input(s): CKTOTAL, CKMB, CKMBINDEX, TROPONINI in the last 168 hours. BNP (last 3 results) No results for input(s): PROBNP in the last 8760 hours. HbA1C: No results for input(s): HGBA1C in the last 72 hours. CBG: No results for input(s): GLUCAP in the last 168 hours. Lipid Profile: No results for input(s): CHOL, HDL, LDLCALC, TRIG, CHOLHDL, LDLDIRECT in the last 72 hours. Thyroid Function Tests: No results for input(s): TSH, T4TOTAL, FREET4, T3FREE, THYROIDAB in the last 72 hours. Anemia Panel: No results for input(s): VITAMINB12, FOLATE, FERRITIN, TIBC, IRON, RETICCTPCT in the last 72 hours. Sepsis Labs: Recent Labs  Lab 12/02/17 1503  LATICACIDVEN 0.97    Recent Results (from the past 240 hour(s))  Blood culture (routine x 2)     Status: None (Preliminary result)   Collection Time: 12/02/17  2:55 PM  Result Value Ref Range Status   Specimen Description   Final    BLOOD LEFT FOOT Performed at [facility] [address] [place], [place] [address]    Special Requests    Final    BOTTLES DRAWN AEROBIC AND ANAEROBIC Blood Culture results may not be optimal due to an excessive volume of blood received in culture bottles Performed at [facility] [address] [place], [place] [address]    Culture  Setup Time   Final    IN BOTH AEROBIC AND ANAEROBIC BOTTLES GRAM POSITIVE COCCI Organism ID to follow CRITICAL RESULT CALLED TO, READ BACK BY AND VERIFIED WITH: B GREEN PHARMD 12/03/17 0532 JDW Performed at [facility], [place] [address] [place], [place] [address]    Culture PENDING  Incomplete   Report Status PENDING  Incomplete  Blood Culture ID Panel (Reflexed)     Status: Abnormal   Collection Time: 12/02/17  2:55 PM  Result Value Ref Range Status   Enterococcus species NOT DETECTED NOT DETECTED Final   Listeria monocytogenes NOT DETECTED NOT DETECTED Final   Staphylococcus species DETECTED (A) NOT DETECTED Final    Comment: CRITICAL RESULT CALLED TO, READ BACK BY AND VERIFIED WITH: B GREEN PHARMD 12/03/17 0532 JDW    Staphylococcus aureus DETECTED (A) NOT DETECTED Final    Comment: Methicillin (oxacillin)  susceptible Staphylococcus aureus (MSSA). Preferred therapy is anti staphylococcal beta lactam antibiotic (Cefazolin or Nafcillin), unless clinically contraindicated. CRITICAL RESULT CALLED TO, READ BACK BY AND VERIFIED WITH: B GREEN PHARMD 12/03/17 0532 JDW    Methicillin resistance NOT DETECTED NOT DETECTED Final   Streptococcus species NOT DETECTED NOT DETECTED Final   Streptococcus agalactiae NOT DETECTED NOT DETECTED Final   Streptococcus pneumoniae NOT DETECTED NOT DETECTED Final   Streptococcus pyogenes NOT DETECTED NOT DETECTED Final   Acinetobacter baumannii NOT DETECTED NOT DETECTED Final   Enterobacteriaceae species NOT DETECTED NOT DETECTED Final   Enterobacter cloacae complex NOT DETECTED NOT DETECTED Final   Escherichia coli NOT DETECTED NOT DETECTED Final   Klebsiella oxytoca NOT DETECTED NOT DETECTED Final    Klebsiella pneumoniae NOT DETECTED NOT DETECTED Final   Proteus species NOT DETECTED NOT DETECTED Final   Serratia marcescens NOT DETECTED NOT DETECTED Final   Haemophilus influenzae NOT DETECTED NOT DETECTED Final   Neisseria meningitidis NOT DETECTED NOT DETECTED Final   Pseudomonas aeruginosa NOT DETECTED NOT DETECTED Final   Candida albicans NOT DETECTED NOT DETECTED Final   Candida glabrata NOT DETECTED NOT DETECTED Final   Candida krusei NOT DETECTED NOT DETECTED Final   Candida parapsilosis NOT DETECTED NOT DETECTED Final   Candida tropicalis NOT DETECTED NOT DETECTED Final    Comment: Performed at [facility], [place]. [address], [place], [place] [address]  Blood culture (routine x 2)     Status: None (Preliminary result)   Collection Time: 12/02/17  3:57 PM  Result Value Ref Range Status   Specimen Description   Final    BLOOD RIGHT ARM Performed at [facility] [address], [place], [place] [address]    Special Requests   Final    BOTTLES DRAWN AEROBIC AND ANAEROBIC Blood Culture adequate volume Performed at [name] [address] [place], [place] [address]    Culture  Setup Time   Final    IN BOTH AEROBIC AND ANAEROBIC BOTTLES GRAM POSITIVE COCCI CRITICAL VALUE NOTED.  VALUE IS CONSISTENT WITH PREVIOUSLY REPORTED AND CALLED VALUE. Performed at [facility], [place] [address], [place], [place] [address]    Culture PENDING  Incomplete   Report Status PENDING  Incomplete  MRSA PCR Screening     Status: None   Collection Time: 12/02/17  6:20 PM  Result Value Ref Range Status   MRSA by PCR NEGATIVE NEGATIVE Final    Comment:        The GeneXpert MRSA Assay (FDA approved for NASAL specimens only), is one component of a comprehensive MRSA colonization surveillance program. It is not intended to diagnose MRSA infection nor to guide or monitor treatment for MRSA infections. Performed at [facility], [place] [address], [place], [place] [address]          Radiology Studies: Dg Chest 2 View  Result Date: 12/02/2017 CLINICAL DATA:  Generalized pain. EXAM: CHEST - 2 VIEW COMPARISON:  11/29/2017 and prior radiographs FINDINGS: The cardiomediastinal silhouette is unremarkable. RIGHT basilar opacity noted-question atelectasis versus airspace disease. No pleural effusion or pneumothorax. No acute bony abnormalities noted. IMPRESSION: RIGHT basilar opacity-question atelectasis versus airspace disease. Electronically Signed   By: Margarette Canada M.D.   On: 12/02/2017 14:46   Ct Chest W Contrast  Result Date: 12/02/2017 CLINICAL DATA:  Pneumonia, fever. History of IV drug abuse. Leukocytosis. EXAM: CT CHEST, ABDOMEN, AND PELVIS WITH CONTRAST TECHNIQUE: Multidetector CT imaging  of the chest, abdomen and pelvis was performed following the standard protocol during bolus administration of intravenous contrast. CONTRAST:  1104m ISOVUE-300 IOPAMIDOL (ISOVUE-300) INJECTION 61% COMPARISON:  11/17/2017. FINDINGS: CT CHEST FINDINGS Cardiovascular: Heart size is normal. No pericardial effusion. Normal 3 vessel aortic branching pattern. Normal appearance of the aorta. Mediastinum/Nodes: No enlarged mediastinal, hilar, or axillary lymph nodes. Thyroid gland, trachea, and esophagus demonstrate no significant findings. Lungs/Pleura: There is multifocal consolidation throughout both lungs, worst in the right middle lobe and both lower lobes. There are cavitary lesions in the anterior right upper lobe and within the left upper lobe. No pleural effusion or pneumothorax. Airways are patent. Musculoskeletal: There are bilateral spinal rods extending from T4-L3. No lytic or blastic lesions. No evidence of discitis-osteomyelitis. CT ABDOMEN PELVIS FINDINGS HEPATOBILIARY: Normal hepatic contours and density. No intra- or extrahepatic biliary dilatation. Status post cholecystectomy. PANCREAS: Normal parenchymal contours without  ductal dilatation. No peripancreatic fluid collection. SPLEEN: Normal. ADRENALS/URINARY TRACT: --Adrenal glands: Normal. --Right kidney/ureter: Linear, nonobstructive interpolar calculus measures up to 13 mm. --Left kidney/ureter: No hydronephrosis, nephroureterolithiasis, perinephric stranding or solid renal mass. --Urinary bladder: Normal for degree of distention STOMACH/BOWEL: --Stomach/Duodenum: No hiatal hernia or other gastric abnormality. Normal duodenal course. --Small bowel: No dilatation or inflammation. --Colon: No focal abnormality. --Appendix: Normal. VASCULAR/LYMPHATIC: Normal course and caliber of the major abdominal vessels. No abdominal or pelvic lymphadenopathy. REPRODUCTIVE: No free fluid in the pelvis. MUSCULOSKELETAL. No bony spinal canal stenosis or focal osseous abnormality. OTHER: None. IMPRESSION: 1. Multifocal consolidation within both lungs, including multiple cavitary lesions, consistent with pneumonia secondary to septic emboli. 2. No other acute abnormality of the chest, abdomen or pelvis. Electronically Signed   By: KUlyses JarredM.D.   On: 12/02/2017 18:23   Ct Abdomen Pelvis W Contrast  Result Date: 12/02/2017 CLINICAL DATA:  Pneumonia, fever. History of IV drug abuse. Leukocytosis. EXAM: CT CHEST, ABDOMEN, AND PELVIS WITH CONTRAST TECHNIQUE: Multidetector CT imaging of the chest, abdomen and pelvis was performed following the standard protocol during bolus administration of intravenous contrast. CONTRAST:  1043mISOVUE-300 IOPAMIDOL (ISOVUE-300) INJECTION 61% COMPARISON:  11/17/2017. FINDINGS: CT CHEST FINDINGS Cardiovascular: Heart size is normal. No pericardial effusion. Normal 3 vessel aortic branching pattern. Normal appearance of the aorta. Mediastinum/Nodes: No enlarged mediastinal, hilar, or axillary lymph nodes. Thyroid gland, trachea, and esophagus demonstrate no significant findings. Lungs/Pleura: There is multifocal consolidation throughout both lungs, worst in the  right middle lobe and both lower lobes. There are cavitary lesions in the anterior right upper lobe and within the left upper lobe. No pleural effusion or pneumothorax. Airways are patent. Musculoskeletal: There are bilateral spinal rods extending from T4-L3. No lytic or blastic lesions. No evidence of discitis-osteomyelitis. CT ABDOMEN PELVIS FINDINGS HEPATOBILIARY: Normal hepatic contours and density. No intra- or extrahepatic biliary dilatation. Status post cholecystectomy. PANCREAS: Normal parenchymal contours without ductal dilatation. No peripancreatic fluid collection. SPLEEN: Normal. ADRENALS/URINARY TRACT: --Adrenal glands: Normal. --Right kidney/ureter: Linear, nonobstructive interpolar calculus measures up to 13 mm. --Left kidney/ureter: No hydronephrosis, nephroureterolithiasis, perinephric stranding or solid renal mass. --Urinary bladder: Normal for degree of distention STOMACH/BOWEL: --Stomach/Duodenum: No hiatal hernia or other gastric abnormality. Normal duodenal course. --Small bowel: No dilatation or inflammation. --Colon: No focal abnormality. --Appendix: Normal. VASCULAR/LYMPHATIC: Normal course and caliber of the major abdominal vessels. No abdominal or pelvic lymphadenopathy. REPRODUCTIVE: No free fluid in the pelvis. MUSCULOSKELETAL. No bony spinal canal stenosis or focal osseous abnormality. OTHER: None. IMPRESSION: 1. Multifocal consolidation within both lungs, including multiple cavitary lesions, consistent with  pneumonia secondary to septic emboli. 2. No other acute abnormality of the chest, abdomen or pelvis. Electronically Signed   By: Ulyses Jarred M.D.   On: 12/02/2017 18:23   [place] Ekg Site Rite  Result Date: 12/02/2017 If Site Rite image not attached, placement could not be confirmed due to current cardiac rhythm.       Scheduled Meds: . chlorhexidine  15 mL Mouth Rinse BID  . cloNIDine  0.1 mg Oral QID   Followed by  . [START ON 12/05/2017] cloNIDine  0.1 mg Oral BH-qamhs    Followed by  . [START ON 12/07/2017] cloNIDine  0.1 mg Oral QAC breakfast  . docusate sodium  100 mg Oral BID  . guaiFENesin  1,200 mg Oral BID  . heparin  5,000 Units Subcutaneous Q8H  . lactobacillus  1 g Oral TID WC  . mouth rinse  15 mL Mouth Rinse q12n4p  . nicotine  21 mg Transdermal Daily  . ondansetron (ZOFRAN) IV  4 mg Intravenous Once  . sodium chloride flush  3 mL Intravenous Q12H  . sodium chloride flush  3 mL Intravenous Q12H   Continuous Infusions: . sodium chloride    . 0.9 % NaCl with KCl 20 mEq / L 75 mL/hr at 12/03/17 0808  .  ceFAZolin (ANCEF) IV    . sodium chloride Stopped (12/02/17 1218)     LOS: 1 day      Georgette Shell, MD [facility] Pager [phone]  If 7PM-7AM, please contact night-coverage [email or web address] Password [email or web address] 12/03/2017, 1:47 PM

## 2017-12-03 NOTE — Progress Notes (Signed)
PT demonstrated hands on and verbal understanding of Flutter device.

## 2017-12-03 NOTE — Progress Notes (Signed)
  Echocardiogram 2D Echocardiogram has been performed.  Kelly [name] 12/03/2017, 9:34 AM

## 2017-12-03 NOTE — Progress Notes (Signed)
RT went to Pt's room to provide flutter valve and instruction.  Pt asleep a this time.  Will try to instruct Pt in flutter use during daytime when awake.

## 2017-12-03 NOTE — CV Procedure (Signed)
Merge echo reporting system is currently not working.  Brief transthoracic echo report:  LVEF 60-65% Severe tricuspid regurgitation. 1.7 cm x 1.0 cm likely vegetation on the septal leaflet of the tricuspid valve consistent with endocarditis. Thickening of the mitral valve leaflet.  Cannot rule out endocarditis. Trivial MR Normal diastolic function No IVC dilatation. Severe RA and mild LA enlargement.  For additional details see full report later.  Kristi Hyer C. Oval Linsey, MD, [facility] 12/03/2017 11:57 AM

## 2017-12-04 ENCOUNTER — Inpatient Hospital Stay (HOSPITAL_COMMUNITY): Payer: Medicaid Other

## 2017-12-04 DIAGNOSIS — F1910 Other psychoactive substance abuse, uncomplicated: Secondary | ICD-10-CM

## 2017-12-04 DIAGNOSIS — I330 Acute and subacute infective endocarditis: Secondary | ICD-10-CM

## 2017-12-04 LAB — BASIC METABOLIC PANEL
Anion gap: 9 (ref 5–15)
BUN: 12 mg/dL (ref 6–20)
CALCIUM: 8.9 mg/dL (ref 8.9–10.3)
CO2: 26 mmol/L (ref 22–32)
Chloride: 101 mmol/L (ref 98–111)
Creatinine, Ser: 0.68 mg/dL (ref 0.44–1.00)
GFR calc Af Amer: >60 mL/min (ref >60)
GLUCOSE: 135 mg/dL — AB (ref 70–99)
Potassium: 4 mmol/L (ref 3.5–5.1)
Sodium: 136 mmol/L (ref 135–145)

## 2017-12-04 LAB — CBC
HEMATOCRIT: 27.1 % — AB (ref 36.0–46.0)
HEMOGLOBIN: 8.7 g/dL — AB (ref 12.0–15.0)
MCH: 29 pg (ref 26.0–34.0)
MCHC: 32.1 g/dL (ref 30.0–36.0)
MCV: 90.3 fL (ref 78.0–100.0)
Platelets: 444 10*3/uL — ABNORMAL HIGH (ref 150–400)
RBC: 3 MIL/uL — AB (ref 3.87–5.11)
RDW: 14.3 % (ref 11.5–15.5)
WBC: 14.9 10*3/uL — ABNORMAL HIGH (ref 4.0–10.5)

## 2017-12-04 LAB — RPR: RPR Ser Ql: NONREACTIVE

## 2017-12-04 LAB — HIV ANTIBODY (ROUTINE TESTING W REFLEX): HIV Screen 4th Generation wRfx: NONREACTIVE

## 2017-12-04 MED ORDER — OXYCODONE HCL 5 MG PO TABS
10.0000 mg | ORAL_TABLET | Freq: Once | ORAL | Status: AC
  Administered 2017-12-04: 10 mg via ORAL
  Filled 2017-12-04: qty 2

## 2017-12-04 MED ORDER — OXYCODONE HCL 5 MG PO TABS
15.0000 mg | ORAL_TABLET | ORAL | Status: DC | PRN
  Administered 2017-12-04 – 2017-12-11 (×28): 15 mg via ORAL
  Filled 2017-12-04 (×29): qty 3

## 2017-12-04 MED ORDER — CYCLOBENZAPRINE HCL 10 MG PO TABS
10.0000 mg | ORAL_TABLET | Freq: Once | ORAL | Status: AC
  Administered 2017-12-04: 10 mg via ORAL
  Filled 2017-12-04: qty 1

## 2017-12-04 MED ORDER — LIP MEDEX EX OINT
TOPICAL_OINTMENT | CUTANEOUS | Status: DC | PRN
  Filled 2017-12-04: qty 7

## 2017-12-04 MED ORDER — HYDROMORPHONE HCL 2 MG/ML IJ SOLN
2.0000 mg | INTRAMUSCULAR | Status: DC | PRN
  Administered 2017-12-04 – 2017-12-05 (×8): 2 mg via INTRAVENOUS
  Filled 2017-12-04 (×8): qty 1

## 2017-12-04 MED ORDER — METHADONE HCL 10 MG PO TABS
40.0000 mg | ORAL_TABLET | Freq: Every day | ORAL | Status: DC
  Administered 2017-12-04 – 2017-12-07 (×4): 40 mg via ORAL
  Filled 2017-12-04 (×4): qty 4

## 2017-12-04 MED ORDER — HYDROMORPHONE HCL 2 MG/ML IJ SOLN
1.5000 mg | INTRAMUSCULAR | Status: DC | PRN
  Administered 2017-12-04: 1.5 mg via INTRAVENOUS
  Filled 2017-12-04: qty 1

## 2017-12-04 MED ORDER — FENTANYL CITRATE (PF) 100 MCG/2ML IJ SOLN
75.0000 ug | INTRAMUSCULAR | Status: DC | PRN
  Administered 2017-12-04: 75 ug via INTRAVENOUS
  Filled 2017-12-04: qty 2

## 2017-12-04 NOTE — Progress Notes (Signed)
Attempted patients MRI, pt in severe pain cursing and screaming, I was able to talk her into trying the MRI and within the 1st minute of scanning she screamed she could not do it due to the severe pain and would need to be knocked completely out to do it. I called [facility] representative and explained situation. bhj [phone]

## 2017-12-04 NOTE — Progress Notes (Signed)
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 -Continue IV cefazolin and started 12/03/2017 -Repeat blood cultures obtained on 12/04/2017 -Blood cultures positive for MSSA on 12/02/2017 on admission -We will discuss with cardiology about need for TEE  -Infectious disease following appreciate recommendations -HIV, RPR, hepatitis B, A, C, GC chlamydia pending  #) AKI: Resolved  #) History of scoliosis status post rod placement/severe back pain: Today patient is complaining of severe pain between her hips and her back.  She apparently had a similar ER visit and had a negative MRI of her thoracic and lumbar spine on 11/21/2017.  This was prior to her presentation for sepsis secondary to MSSA endocarditis. -Repeat MRI of thoracic and lumbar spine with sedation due to inability to tolerate regular MRI - IV hydromorphone 2 mg every 3 hours as needed -Start methadone per below - Continue methocarbamol 500 mg every 8 as needed as needed -Continue oxycodone as needed -Continue IV Toradol every 6 hours as needed  #) IV drug use: -Start methadone 40 mg daily, patient was apparently on 80 mg as an outpatient but had not been seen in the clinic for several weeks    -continue clonidine for withdrawal  Fluids: Gentle IV fluids Electrodes: Monitor and supplement Nutrition: Regular diet  Prophylaxis: Subcu heparin  Disposition: Pending antibiotic plan and evaluation of spine  Full code    Consultants:   Infectious disease  Cardiology  Procedures:  Echo 12/03/2017:Impressions:  - Vegetation noted on the septal leaflet of the tricuspid valve   consistent with endocarditis. Cannot exlude vegetation on the   anterior leaflet of the mitral valve. Suggest TEE to better    assess.  Antimicrobials:  IV cefazolin started 12/02/2017   Subjective: Patient reports severe pain over the bilateral buttocks, lower back, between her hips.  She also reports bilateral numbness and tingling.  She reports the symptoms have only been ongoing for 1 week and have crescendoed today.  Objective: Vitals:   12/04/17 0347 12/04/17 0400 12/04/17 0500 12/04/17 0749  BP:  128/82 124/77   Pulse:      Resp:  (!) 22 (!) 26   Temp: 97.6 F (36.4 C)   98.4 F (36.9 C)  TempSrc: Oral   Oral  SpO2:  94% 93%   Weight:      Height:        Intake/Output Summary (Last 24 hours) at 12/04/2017 1037 Last data filed at 12/04/2017 1020 Gross per 24 hour  Intake 4818.75 ml  Output 1260 ml  Net 3558.75 ml   Filed Weights   12/02/17 1135  Weight: 72.6 kg (160 lb)    Examination:  General exam: Uncomfortable appearing Respiratory system: No increased work of breathing, scattered rhonchi, no wheezes or crackles  Cardiovascular system: Distant heart sounds, regular rate and rhythm, no murmurs Gastrointestinal system: Abdomen is nondistended, soft and nontender. No organomegaly or masses felt. Normal bowel sounds heard. Central nervous system: Alert and oriented.  Bilateral lower extremity strength is 4 out of 5 however unclear if related to pain or not, extreme pain on flexion of hip bilaterally with diminished sensation to touch Extremities: Trace lower extremity  edema Skin: IV drug sites Psychiatry: Judgement and insight appear normal. Mood & affect appropriate.     Data Reviewed: I have personally reviewed following labs and imaging studies  CBC: Recent Labs  Lab 12/02/17 1320 12/03/17 0327 12/04/17 0538  WBC 28.3* 21.9* 14.9*  HGB 9.5* 9.6* 8.7*  HCT 28.7* 29.2* 27.1*  MCV 87.8 88.0 90.3  PLT 402* 358 366*   Basic Metabolic Panel: Recent Labs  Lab 12/02/17 1320 12/03/17 0327 12/04/17 0538  NA 130* 133* 136  K 3.6 4.2 4.0  CL 90* 98 101  CO2 27 27 26   GLUCOSE 113* 109* 135*  BUN 23* 17 12  CREATININE 1.04* 0.88 0.68  CALCIUM 8.9 8.5* 8.9   GFR: Estimated Creatinine Clearance: 97.3 mL/min (by C-G formula based on SCr of 0.68 mg/dL). Liver Function Tests: Recent Labs  Lab 12/02/17 1320 12/03/17 0327  AST 13* 14*  ALT 6 6  ALKPHOS 168* 211*  BILITOT 1.0 0.8  PROT 7.2 6.6  ALBUMIN 2.4* 2.0*   No results for input(s): LIPASE, AMYLASE in the last 168 hours. No results for input(s): AMMONIA in the last 168 hours. Coagulation Profile: No results for input(s): INR, PROTIME in the last 168 hours. Cardiac Enzymes: No results for input(s): CKTOTAL, CKMB, CKMBINDEX, TROPONINI in the last 168 hours. BNP (last 3 results) No results for input(s): PROBNP in the last 8760 hours. HbA1C: No results for input(s): HGBA1C in the last 72 hours. CBG: No results for input(s): GLUCAP in the last 168 hours. Lipid Profile: No results for input(s): CHOL, HDL, LDLCALC, TRIG, CHOLHDL, LDLDIRECT in the last 72 hours. Thyroid Function Tests: No results for input(s): TSH, T4TOTAL, FREET4, T3FREE, THYROIDAB in the last 72 hours. Anemia Panel: No results for input(s): VITAMINB12, FOLATE, FERRITIN, TIBC, IRON, RETICCTPCT in the last 72 hours. Sepsis Labs: Recent Labs  Lab 12/02/17 1503  LATICACIDVEN 0.97    Recent Results (from the past 240 hour(s))  Blood culture (routine x 2)     Status: Abnormal (Preliminary result)   Collection  Time: 12/02/17  2:55 PM  Result Value Ref Range Status   Specimen Description   Final    BLOOD LEFT FOOT Performed at [facility] [address] [place], [place] [address]    Special Requests   Final    BOTTLES DRAWN AEROBIC AND ANAEROBIC Blood Culture results may not be optimal due to an excessive volume of blood received in culture bottles Performed at [facility] [address] [place], [place] [address]    Culture  Setup Time   Final    IN BOTH AEROBIC AND ANAEROBIC BOTTLES GRAM POSITIVE COCCI CRITICAL RESULT CALLED TO, READ BACK BY AND VERIFIED WITH: B GREEN PHARMD 12/03/17 0532 JDW    Culture (A)  Final    STAPHYLOCOCCUS AUREUS SUSCEPTIBILITIES TO FOLLOW Performed at [facility], [place] [address], [place], [place] [address]    Report Status PENDING  Incomplete  Blood Culture ID Panel (Reflexed)     Status: Abnormal   Collection Time: 12/02/17  2:55 PM  Result  Value Ref Range Status   Enterococcus species NOT DETECTED NOT DETECTED Final   Listeria monocytogenes NOT DETECTED NOT DETECTED Final   Staphylococcus species DETECTED (A) NOT DETECTED Final    Comment: CRITICAL RESULT CALLED TO, READ BACK BY AND VERIFIED WITH: B GREEN PHARMD 12/03/17 0532 JDW    Staphylococcus aureus DETECTED (A) NOT DETECTED Final    Comment: Methicillin (oxacillin) susceptible Staphylococcus aureus (MSSA). Preferred therapy is anti staphylococcal beta lactam antibiotic (Cefazolin or Nafcillin), unless clinically contraindicated. CRITICAL RESULT CALLED TO, READ BACK BY AND VERIFIED WITH: B GREEN PHARMD 12/03/17 0532 JDW    Methicillin resistance NOT DETECTED NOT DETECTED Final   Streptococcus species NOT DETECTED NOT DETECTED Final   Streptococcus agalactiae NOT DETECTED NOT DETECTED Final   Streptococcus pneumoniae NOT DETECTED NOT DETECTED Final   Streptococcus pyogenes NOT DETECTED NOT DETECTED Final   Acinetobacter baumannii NOT DETECTED NOT  DETECTED Final   Enterobacteriaceae species NOT DETECTED NOT DETECTED Final   Enterobacter cloacae complex NOT DETECTED NOT DETECTED Final   Escherichia coli NOT DETECTED NOT DETECTED Final   Klebsiella oxytoca NOT DETECTED NOT DETECTED Final   Klebsiella pneumoniae NOT DETECTED NOT DETECTED Final   Proteus species NOT DETECTED NOT DETECTED Final   Serratia marcescens NOT DETECTED NOT DETECTED Final   Haemophilus influenzae NOT DETECTED NOT DETECTED Final   Neisseria meningitidis NOT DETECTED NOT DETECTED Final   Pseudomonas aeruginosa NOT DETECTED NOT DETECTED Final   Candida albicans NOT DETECTED NOT DETECTED Final   Candida glabrata NOT DETECTED NOT DETECTED Final   Candida krusei NOT DETECTED NOT DETECTED Final   Candida parapsilosis NOT DETECTED NOT DETECTED Final   Candida tropicalis NOT DETECTED NOT DETECTED Final    Comment: Performed at [facility], [place]. [address], [place], [place] [address]  Blood culture (routine x 2)     Status: Abnormal (Preliminary result)   Collection Time: 12/02/17  3:57 PM  Result Value Ref Range Status   Specimen Description   Final    BLOOD RIGHT ARM Performed at [facility] [address] [place], [place] [address]    Special Requests   Final    BOTTLES DRAWN AEROBIC AND ANAEROBIC Blood Culture adequate volume Performed at [facility] [address] [place], [place] [address]    Culture  Setup Time   Final    IN BOTH AEROBIC AND ANAEROBIC BOTTLES GRAM POSITIVE COCCI CRITICAL VALUE NOTED.  VALUE IS CONSISTENT WITH PREVIOUSLY REPORTED AND CALLED VALUE. Performed at [facility], [place] [address], [place], [place] [address]    Culture STAPHYLOCOCCUS AUREUS (A)  Final   Report Status PENDING  Incomplete  MRSA PCR Screening     Status: None   Collection Time: 12/02/17  6:20 PM  Result Value Ref Range Status   MRSA by PCR NEGATIVE NEGATIVE Final    Comment:        The GeneXpert MRSA Assay  (FDA approved for NASAL specimens only), is one component of a comprehensive MRSA colonization surveillance program. It is not intended to diagnose MRSA infection nor to guide or monitor treatment for MRSA infections. Performed at [facility], [place] [address] [place], [place] [address]          Radiology Studies: Dg Chest 2 View  Result Date: 12/02/2017 CLINICAL DATA:  Generalized pain. EXAM: CHEST - 2 VIEW COMPARISON:  11/29/2017 and prior radiographs FINDINGS: The cardiomediastinal silhouette is unremarkable. RIGHT basilar opacity noted-question  atelectasis versus airspace disease. No pleural effusion or pneumothorax. No acute bony abnormalities noted. IMPRESSION: RIGHT basilar opacity-question atelectasis versus airspace disease. Electronically Signed   By: Margarette Canada M.D.   On: 12/02/2017 14:46   Ct Chest W Contrast  Result Date: 12/02/2017 CLINICAL DATA:  Pneumonia, fever. History of IV drug abuse. Leukocytosis. EXAM: CT CHEST, ABDOMEN, AND PELVIS WITH CONTRAST TECHNIQUE: Multidetector CT imaging of the chest, abdomen and pelvis was performed following the standard protocol during bolus administration of intravenous contrast. CONTRAST:  188m ISOVUE-300 IOPAMIDOL (ISOVUE-300) INJECTION 61% COMPARISON:  11/17/2017. FINDINGS: CT CHEST FINDINGS Cardiovascular: Heart size is normal. No pericardial effusion. Normal 3 vessel aortic branching pattern. Normal appearance of the aorta. Mediastinum/Nodes: No enlarged mediastinal, hilar, or axillary lymph nodes. Thyroid gland, trachea, and esophagus demonstrate no significant findings. Lungs/Pleura: There is multifocal consolidation throughout both lungs, worst in the right middle lobe and both lower lobes. There are cavitary lesions in the anterior right upper lobe and within the left upper lobe. No pleural effusion or pneumothorax. Airways are patent. Musculoskeletal: There are bilateral spinal rods extending from T4-L3. No  lytic or blastic lesions. No evidence of discitis-osteomyelitis. CT ABDOMEN PELVIS FINDINGS HEPATOBILIARY: Normal hepatic contours and density. No intra- or extrahepatic biliary dilatation. Status post cholecystectomy. PANCREAS: Normal parenchymal contours without ductal dilatation. No peripancreatic fluid collection. SPLEEN: Normal. ADRENALS/URINARY TRACT: --Adrenal glands: Normal. --Right kidney/ureter: Linear, nonobstructive interpolar calculus measures up to 13 mm. --Left kidney/ureter: No hydronephrosis, nephroureterolithiasis, perinephric stranding or solid renal mass. --Urinary bladder: Normal for degree of distention STOMACH/BOWEL: --Stomach/Duodenum: No hiatal hernia or other gastric abnormality. Normal duodenal course. --Small bowel: No dilatation or inflammation. --Colon: No focal abnormality. --Appendix: Normal. VASCULAR/LYMPHATIC: Normal course and caliber of the major abdominal vessels. No abdominal or pelvic lymphadenopathy. REPRODUCTIVE: No free fluid in the pelvis. MUSCULOSKELETAL. No bony spinal canal stenosis or focal osseous abnormality. OTHER: None. IMPRESSION: 1. Multifocal consolidation within both lungs, including multiple cavitary lesions, consistent with pneumonia secondary to septic emboli. 2. No other acute abnormality of the chest, abdomen or pelvis. Electronically Signed   By: KUlyses JarredM.D.   On: 12/02/2017 18:23   Ct Abdomen Pelvis W Contrast  Result Date: 12/02/2017 CLINICAL DATA:  Pneumonia, fever. History of IV drug abuse. Leukocytosis. EXAM: CT CHEST, ABDOMEN, AND PELVIS WITH CONTRAST TECHNIQUE: Multidetector CT imaging of the chest, abdomen and pelvis was performed following the standard protocol during bolus administration of intravenous contrast. CONTRAST:  1079mISOVUE-300 IOPAMIDOL (ISOVUE-300) INJECTION 61% COMPARISON:  11/17/2017. FINDINGS: CT CHEST FINDINGS Cardiovascular: Heart size is normal. No pericardial effusion. Normal 3 vessel aortic branching pattern.  Normal appearance of the aorta. Mediastinum/Nodes: No enlarged mediastinal, hilar, or axillary lymph nodes. Thyroid gland, trachea, and esophagus demonstrate no significant findings. Lungs/Pleura: There is multifocal consolidation throughout both lungs, worst in the right middle lobe and both lower lobes. There are cavitary lesions in the anterior right upper lobe and within the left upper lobe. No pleural effusion or pneumothorax. Airways are patent. Musculoskeletal: There are bilateral spinal rods extending from T4-L3. No lytic or blastic lesions. No evidence of discitis-osteomyelitis. CT ABDOMEN PELVIS FINDINGS HEPATOBILIARY: Normal hepatic contours and density. No intra- or extrahepatic biliary dilatation. Status post cholecystectomy. PANCREAS: Normal parenchymal contours without ductal dilatation. No peripancreatic fluid collection. SPLEEN: Normal. ADRENALS/URINARY TRACT: --Adrenal glands: Normal. --Right kidney/ureter: Linear, nonobstructive interpolar calculus measures up to 13 mm. --Left kidney/ureter: No hydronephrosis, nephroureterolithiasis, perinephric stranding or solid renal mass. --Urinary bladder: Normal for degree of distention STOMACH/BOWEL: --Stomach/Duodenum:  No hiatal hernia or other gastric abnormality. Normal duodenal course. --Small bowel: No dilatation or inflammation. --Colon: No focal abnormality. --Appendix: Normal. VASCULAR/LYMPHATIC: Normal course and caliber of the major abdominal vessels. No abdominal or pelvic lymphadenopathy. REPRODUCTIVE: No free fluid in the pelvis. MUSCULOSKELETAL. No bony spinal canal stenosis or focal osseous abnormality. OTHER: None. IMPRESSION: 1. Multifocal consolidation within both lungs, including multiple cavitary lesions, consistent with pneumonia secondary to septic emboli. 2. No other acute abnormality of the chest, abdomen or pelvis. Electronically Signed   By: Ulyses Jarred M.D.   On: 12/02/2017 18:23   [place] Ekg Site Rite  Result Date:  12/02/2017 If Site Rite image not attached, placement could not be confirmed due to current cardiac rhythm.       Scheduled Meds: . chlorhexidine  15 mL Mouth Rinse BID  . cloNIDine  0.1 mg Oral QID   Followed by  . [START ON 12/05/2017] cloNIDine  0.1 mg Oral BH-qamhs   Followed by  . [START ON 12/07/2017] cloNIDine  0.1 mg Oral QAC breakfast  . docusate sodium  100 mg Oral BID  . guaiFENesin  1,200 mg Oral BID  . heparin  5,000 Units Subcutaneous Q8H  . lactobacillus  1 g Oral TID WC  . mouth rinse  15 mL Mouth Rinse q12n4p  . methadone  40 mg Oral Daily  . nicotine  21 mg Transdermal Daily  . ondansetron (ZOFRAN) IV  4 mg Intravenous Once  . sodium chloride flush  3 mL Intravenous Q12H  . sodium chloride flush  3 mL Intravenous Q12H   Continuous Infusions: . sodium chloride    . sodium chloride 150 mL/hr at 12/04/17 0924  .  ceFAZolin (ANCEF) IV Stopped (12/04/17 0553)  . sodium chloride Stopped (12/02/17 1218)     LOS: 2 days    Time spent: [place], MD [facility]  If 7PM-7AM, please contact night-coverage [email or web address] Password [facility] 12/04/2017, 10:37 AM

## 2017-12-04 NOTE — Progress Notes (Signed)
Pt woke up and now left leg is completely numb and pt unable to move toes but can slightly lift leg, right side pt has feeling but mostly tingling, able to wiggle toes and move leg.  Awaiting to go for MRI.

## 2017-12-04 NOTE — Progress Notes (Addendum)
INFECTIOUS DISEASE PROGRESS NOTE  ID: Kelly [name] is a 32 y.o. female with  Active Problems:   Cocaine abuse ([place])   Polysubstance abuse ([place])   Sepsis ([place])   Febrile illness   Difficult intravenous access   Leukocytosis   Intravenous drug abuse ([place])   AKI (acute kidney injury) ([place])  Subjective: Resting quietly  Abtx:  Anti-infectives (From admission, onward)   Start     Dose/Rate Route Frequency Ordered Stop   12/03/17 1600  azithromycin (ZITHROMAX) 500 mg in sodium chloride 0.9 % 250 mL IVPB  Status:  Discontinued     500 mg 250 mL/hr over 60 Minutes Intravenous Every 24 hours 12/02/17 1706 12/03/17 0948   12/03/17 1400  ceFAZolin (ANCEF) IVPB 2g/100 mL premix     2 g 200 mL/hr over 30 Minutes Intravenous Every 8 hours 12/03/17 0948     12/03/17 1000  vancomycin (VANCOCIN) IVPB 750 mg/150 ml premix  Status:  Discontinued     750 mg 150 mL/hr over 60 Minutes Intravenous Every 12 hours 12/02/17 1710 12/03/17 0948   12/02/17 2200  piperacillin-tazobactam (ZOSYN) IVPB 3.375 g  Status:  Discontinued     3.375 g 12.5 mL/hr over 240 Minutes Intravenous Every 8 hours 12/02/17 1710 12/03/17 0948   12/02/17 1800  vancomycin (VANCOCIN) 1,500 mg in sodium chloride 0.9 % 500 mL IVPB     1,500 mg 250 mL/hr over 120 Minutes Intravenous  Once 12/02/17 1710 12/02/17 2110   12/02/17 1530  cefTRIAXone (ROCEPHIN) 1 g in sodium chloride 0.9 % 100 mL IVPB     1 g 200 mL/hr over 30 Minutes Intravenous  Once 12/02/17 1522 12/02/17 1626   12/02/17 1530  azithromycin (ZITHROMAX) 500 mg in sodium chloride 0.9 % 250 mL IVPB     500 mg 250 mL/hr over 60 Minutes Intravenous  Once 12/02/17 1522 12/02/17 1759      Medications:  Scheduled: . chlorhexidine  15 mL Mouth Rinse BID  . cloNIDine  0.1 mg Oral QID   Followed by  . [START ON 12/05/2017] cloNIDine  0.1 mg Oral BH-qamhs   Followed by  . [START ON 12/07/2017] cloNIDine  0.1 mg Oral QAC breakfast  . docusate sodium  100 mg Oral  BID  . guaiFENesin  1,200 mg Oral BID  . heparin  5,000 Units Subcutaneous Q8H  . lactobacillus  1 g Oral TID WC  . mouth rinse  15 mL Mouth Rinse q12n4p  . methadone  40 mg Oral Daily  . nicotine  21 mg Transdermal Daily  . ondansetron (ZOFRAN) IV  4 mg Intravenous Once  . sodium chloride flush  3 mL Intravenous Q12H  . sodium chloride flush  3 mL Intravenous Q12H    Objective: Vital signs in last 24 hours: Temp:  [97.6 F (36.4 C)-98.9 F (37.2 C)] 98.7 F (37.1 C) (07/24 1138) Resp:  [17-27] 26 (07/24 0500) BP: (90-128)/(49-82) 124/77 (07/24 0500) SpO2:  [93 %-100 %] 93 % (07/24 0500)   General appearance: no distress Resp: clear to auscultation bilaterally Cardio: regular rate and rhythm GI: normal findings: bowel sounds normal and soft, non-tender  Lab Results Recent Labs    12/03/17 0327 12/04/17 0538  WBC 21.9* 14.9*  HGB 9.6* 8.7*  HCT 29.2* 27.1*  NA 133* 136  K 4.2 4.0  CL 98 101  CO2 27 26  BUN 17 12  CREATININE 0.88 0.68   Liver Panel Recent Labs    12/02/17 1320 12/03/17  0327  PROT 7.2 6.6  ALBUMIN 2.4* 2.0*  AST 13* 14*  ALT 6 6  ALKPHOS 168* 211*  BILITOT 1.0 0.8   Sedimentation Rate Recent Labs    12/02/17 1320  ESRSEDRATE 136*   C-Reactive Protein No results for input(s): CRP in the last 72 hours.  Microbiology: Recent Results (from the past 240 hour(s))  Blood culture (routine x 2)     Status: Abnormal (Preliminary result)   Collection Time: 12/02/17  2:55 PM  Result Value Ref Range Status   Specimen Description   Final    BLOOD LEFT FOOT Performed at [facility], [place] [address] [place], [place] [address]    Special Requests   Final    BOTTLES DRAWN AEROBIC AND ANAEROBIC Blood Culture results may not be optimal due to an excessive volume of blood received in culture bottles Performed at [facility] [address] [place], [place] [address]    Culture  Setup Time   Final    IN  BOTH AEROBIC AND ANAEROBIC BOTTLES GRAM POSITIVE COCCI CRITICAL RESULT CALLED TO, READ BACK BY AND VERIFIED WITH: B GREEN PHARMD 12/03/17 0532 JDW    Culture (A)  Final    STAPHYLOCOCCUS AUREUS SUSCEPTIBILITIES TO FOLLOW Performed at [facility], [place] [address] [place], [place] [address]    Report Status PENDING  Incomplete  Blood Culture ID Panel (Reflexed)     Status: Abnormal   Collection Time: 12/02/17  2:55 PM  Result Value Ref Range Status   Enterococcus species NOT DETECTED NOT DETECTED Final   Listeria monocytogenes NOT DETECTED NOT DETECTED Final   Staphylococcus species DETECTED (A) NOT DETECTED Final    Comment: CRITICAL RESULT CALLED TO, READ BACK BY AND VERIFIED WITH: B GREEN PHARMD 12/03/17 0532 JDW    Staphylococcus aureus DETECTED (A) NOT DETECTED Final    Comment: Methicillin (oxacillin) susceptible Staphylococcus aureus (MSSA). Preferred therapy is anti staphylococcal beta lactam antibiotic (Cefazolin or Nafcillin), unless clinically contraindicated. CRITICAL RESULT CALLED TO, READ BACK BY AND VERIFIED WITH: B GREEN PHARMD 12/03/17 0532 JDW    Methicillin resistance NOT DETECTED NOT DETECTED Final   Streptococcus species NOT DETECTED NOT DETECTED Final   Streptococcus agalactiae NOT DETECTED NOT DETECTED Final   Streptococcus pneumoniae NOT DETECTED NOT DETECTED Final   Streptococcus pyogenes NOT DETECTED NOT DETECTED Final   Acinetobacter baumannii NOT DETECTED NOT DETECTED Final   Enterobacteriaceae species NOT DETECTED NOT DETECTED Final   Enterobacter cloacae complex NOT DETECTED NOT DETECTED Final   Escherichia coli NOT DETECTED NOT DETECTED Final   Klebsiella oxytoca NOT DETECTED NOT DETECTED Final   Klebsiella pneumoniae NOT DETECTED NOT DETECTED Final   Proteus species NOT DETECTED NOT DETECTED Final   Serratia marcescens NOT DETECTED NOT DETECTED Final   Haemophilus influenzae NOT DETECTED NOT DETECTED Final   Neisseria meningitidis NOT DETECTED  NOT DETECTED Final   Pseudomonas aeruginosa NOT DETECTED NOT DETECTED Final   Candida albicans NOT DETECTED NOT DETECTED Final   Candida glabrata NOT DETECTED NOT DETECTED Final   Candida krusei NOT DETECTED NOT DETECTED Final   Candida parapsilosis NOT DETECTED NOT DETECTED Final   Candida tropicalis NOT DETECTED NOT DETECTED Final    Comment: Performed at [facility], [place]. [address] [place], [place] [address]  Blood culture (routine x 2)     Status: Abnormal (Preliminary result)   Collection Time: 12/02/17  3:57 PM  Result Value Ref Range Status  Specimen Description   Final    BLOOD RIGHT ARM Performed at [facility] [address], [place], [place] [address]    Special Requests   Final    BOTTLES DRAWN AEROBIC AND ANAEROBIC Blood Culture adequate volume Performed at [facility] [address] [place], [place] [address]    Culture  Setup Time   Final    IN BOTH AEROBIC AND ANAEROBIC BOTTLES GRAM POSITIVE COCCI CRITICAL VALUE NOTED.  VALUE IS CONSISTENT WITH PREVIOUSLY REPORTED AND CALLED VALUE. Performed at [facility], [place] [address], [place], [place] [address]    Culture STAPHYLOCOCCUS AUREUS (A)  Final   Report Status PENDING  Incomplete  MRSA PCR Screening     Status: None   Collection Time: 12/02/17  6:20 PM  Result Value Ref Range Status   MRSA by PCR NEGATIVE NEGATIVE Final    Comment:        The GeneXpert MRSA Assay (FDA approved for NASAL specimens only), is one component of a comprehensive MRSA colonization surveillance program. It is not intended to diagnose MRSA infection nor to guide or monitor treatment for MRSA infections. Performed at [facility], [place] [address], [place], [place] [address]     Studies/Results: Ct Chest W Contrast  Result Date: 12/02/2017 CLINICAL DATA:  Pneumonia, fever. History of IV drug abuse. Leukocytosis. EXAM: CT CHEST, ABDOMEN, AND PELVIS WITH  CONTRAST TECHNIQUE: Multidetector CT imaging of the chest, abdomen and pelvis was performed following the standard protocol during bolus administration of intravenous contrast. CONTRAST:  145m ISOVUE-300 IOPAMIDOL (ISOVUE-300) INJECTION 61% COMPARISON:  11/17/2017. FINDINGS: CT CHEST FINDINGS Cardiovascular: Heart size is normal. No pericardial effusion. Normal 3 vessel aortic branching pattern. Normal appearance of the aorta. Mediastinum/Nodes: No enlarged mediastinal, hilar, or axillary lymph nodes. Thyroid gland, trachea, and esophagus demonstrate no significant findings. Lungs/Pleura: There is multifocal consolidation throughout both lungs, worst in the right middle lobe and both lower lobes. There are cavitary lesions in the anterior right upper lobe and within the left upper lobe. No pleural effusion or pneumothorax. Airways are patent. Musculoskeletal: There are bilateral spinal rods extending from T4-L3. No lytic or blastic lesions. No evidence of discitis-osteomyelitis. CT ABDOMEN PELVIS FINDINGS HEPATOBILIARY: Normal hepatic contours and density. No intra- or extrahepatic biliary dilatation. Status post cholecystectomy. PANCREAS: Normal parenchymal contours without ductal dilatation. No peripancreatic fluid collection. SPLEEN: Normal. ADRENALS/URINARY TRACT: --Adrenal glands: Normal. --Right kidney/ureter: Linear, nonobstructive interpolar calculus measures up to 13 mm. --Left kidney/ureter: No hydronephrosis, nephroureterolithiasis, perinephric stranding or solid renal mass. --Urinary bladder: Normal for degree of distention STOMACH/BOWEL: --Stomach/Duodenum: No hiatal hernia or other gastric abnormality. Normal duodenal course. --Small bowel: No dilatation or inflammation. --Colon: No focal abnormality. --Appendix: Normal. VASCULAR/LYMPHATIC: Normal course and caliber of the major abdominal vessels. No abdominal or pelvic lymphadenopathy. REPRODUCTIVE: No free fluid in the pelvis. MUSCULOSKELETAL. No  bony spinal canal stenosis or focal osseous abnormality. OTHER: None. IMPRESSION: 1. Multifocal consolidation within both lungs, including multiple cavitary lesions, consistent with pneumonia secondary to septic emboli. 2. No other acute abnormality of the chest, abdomen or pelvis. Electronically Signed   By: KUlyses JarredM.D.   On: 12/02/2017 18:23   Ct Abdomen Pelvis W Contrast  Result Date: 12/02/2017 CLINICAL DATA:  Pneumonia, fever. History of IV drug abuse. Leukocytosis. EXAM: CT CHEST, ABDOMEN, AND PELVIS WITH CONTRAST TECHNIQUE: Multidetector CT imaging of the chest, abdomen and pelvis was performed following the standard protocol during bolus administration of intravenous  contrast. CONTRAST:  141m ISOVUE-300 IOPAMIDOL (ISOVUE-300) INJECTION 61% COMPARISON:  11/17/2017. FINDINGS: CT CHEST FINDINGS Cardiovascular: Heart size is normal. No pericardial effusion. Normal 3 vessel aortic branching pattern. Normal appearance of the aorta. Mediastinum/Nodes: No enlarged mediastinal, hilar, or axillary lymph nodes. Thyroid gland, trachea, and esophagus demonstrate no significant findings. Lungs/Pleura: There is multifocal consolidation throughout both lungs, worst in the right middle lobe and both lower lobes. There are cavitary lesions in the anterior right upper lobe and within the left upper lobe. No pleural effusion or pneumothorax. Airways are patent. Musculoskeletal: There are bilateral spinal rods extending from T4-L3. No lytic or blastic lesions. No evidence of discitis-osteomyelitis. CT ABDOMEN PELVIS FINDINGS HEPATOBILIARY: Normal hepatic contours and density. No intra- or extrahepatic biliary dilatation. Status post cholecystectomy. PANCREAS: Normal parenchymal contours without ductal dilatation. No peripancreatic fluid collection. SPLEEN: Normal. ADRENALS/URINARY TRACT: --Adrenal glands: Normal. --Right kidney/ureter: Linear, nonobstructive interpolar calculus measures up to 13 mm. --Left  kidney/ureter: No hydronephrosis, nephroureterolithiasis, perinephric stranding or solid renal mass. --Urinary bladder: Normal for degree of distention STOMACH/BOWEL: --Stomach/Duodenum: No hiatal hernia or other gastric abnormality. Normal duodenal course. --Small bowel: No dilatation or inflammation. --Colon: No focal abnormality. --Appendix: Normal. VASCULAR/LYMPHATIC: Normal course and caliber of the major abdominal vessels. No abdominal or pelvic lymphadenopathy. REPRODUCTIVE: No free fluid in the pelvis. MUSCULOSKELETAL. No bony spinal canal stenosis or focal osseous abnormality. OTHER: None. IMPRESSION: 1. Multifocal consolidation within both lungs, including multiple cavitary lesions, consistent with pneumonia secondary to septic emboli. 2. No other acute abnormality of the chest, abdomen or pelvis. Electronically Signed   By: KUlyses JarredM.D.   On: 12/02/2017 18:23     Assessment/Plan: Staph aureus bacteremia TV IE on TTE IVDA (polysubs) Hep C  Total days of antibiotics: ancef 2  Repeat BCx are pending Hepatitis studies are pending.  Continue ancef         JBobby RumpfMD, FACP Infectious Diseases (pager) [phone] 12/04/2017, 3:05 PM  LOS: 2 days

## 2017-12-04 NOTE — Progress Notes (Signed)
pt refused anymore imaging. spoke with Dr Nevada Crane about not completing out exam due to limted amount images aquired. Pt was attempted twice and unable to do exam with pain meds.

## 2017-12-04 NOTE — Progress Notes (Signed)
Pt frequently crying out in pain. Complains of sciatica nerve pain running from the left buttock to her foot. All prn pain medications given throughout the night, with no change. MD notified, prn 75 mcg of fentanyl q2h ordered. Will continue to monitor.

## 2017-12-04 NOTE — Progress Notes (Signed)
Notified MD of pt being unable to complete MRI and gave number for [facility].  Also asked about TEE being canceled and MD said he was not aware. Will continue to resolve this issue

## 2017-12-04 NOTE — Progress Notes (Addendum)
Pt still in extreme pain, did doppler of both pedal pulses, easily heard on right and left pt has some tingling but getting worse, left leg pt has no feeling and cap blood flow <3sec and both feet warm to touch. now unable to feel herself voiding. MD paged

## 2017-12-05 ENCOUNTER — Inpatient Hospital Stay (HOSPITAL_COMMUNITY): Payer: Medicaid Other

## 2017-12-05 ENCOUNTER — Ambulatory Visit (HOSPITAL_COMMUNITY): Admit: 2017-12-05 | Payer: Self-pay

## 2017-12-05 ENCOUNTER — Encounter (HOSPITAL_COMMUNITY): Payer: Self-pay | Admitting: Certified Registered Nurse Anesthetist

## 2017-12-05 ENCOUNTER — Encounter (HOSPITAL_COMMUNITY)
Admission: EM | Disposition: A | Discharge status: Home / Self Care | Payer: Self-pay | Source: Home / Self Care | Attending: Internal Medicine

## 2017-12-05 ENCOUNTER — Inpatient Hospital Stay (HOSPITAL_COMMUNITY): Payer: Medicaid Other | Admitting: Anesthesiology

## 2017-12-05 ENCOUNTER — Ambulatory Visit (HOSPITAL_COMMUNITY)
Admission: EM | Admit: 2017-12-05 | Discharge: 2017-12-05 | Disposition: A | Discharge status: Home / Self Care | Payer: Medicaid Other | Source: Home / Self Care | Attending: Internal Medicine | Admitting: Internal Medicine

## 2017-12-05 DIAGNOSIS — G061 Intraspinal abscess and granuloma: Secondary | ICD-10-CM

## 2017-12-05 HISTORY — PX: RADIOLOGY WITH ANESTHESIA: SHX6223

## 2017-12-05 HISTORY — DX: Intraspinal abscess and granuloma: G06.1

## 2017-12-05 HISTORY — PX: LUMBAR LAMINECTOMY/DECOMPRESSION MICRODISCECTOMY: SHX5026

## 2017-12-05 LAB — HEPATITIS B SURFACE ANTIGEN: Hepatitis B Surface Ag: NEGATIVE

## 2017-12-05 LAB — BASIC METABOLIC PANEL
Anion gap: 8 (ref 5–15)
BUN: 11 mg/dL (ref 6–20)
CHLORIDE: 106 mmol/L (ref 98–111)
CO2: 24 mmol/L (ref 22–32)
Calcium: 8.7 mg/dL — ABNORMAL LOW (ref 8.9–10.3)
Creatinine, Ser: 0.57 mg/dL (ref 0.44–1.00)
GFR calc Af Amer: >60 mL/min (ref >60)
GFR calc non Af Amer: >60 mL/min (ref >60)
Glucose, Bld: 100 mg/dL — ABNORMAL HIGH (ref 70–99)
Potassium: 4.1 mmol/L (ref 3.5–5.1)
Sodium: 138 mmol/L (ref 135–145)

## 2017-12-05 LAB — CBC
HEMATOCRIT: 23.1 % — AB (ref 36.0–46.0)
HEMOGLOBIN: 7.2 g/dL — AB (ref 12.0–15.0)
MCH: 28.7 pg (ref 26.0–34.0)
MCHC: 31.2 g/dL (ref 30.0–36.0)
MCV: 92 fL (ref 78.0–100.0)
Platelets: 419 10*3/uL — ABNORMAL HIGH (ref 150–400)
RBC: 2.51 MIL/uL — ABNORMAL LOW (ref 3.87–5.11)
RDW: 14.7 % (ref 11.5–15.5)
WBC: 11.1 10*3/uL — ABNORMAL HIGH (ref 4.0–10.5)

## 2017-12-05 LAB — HEPATITIS B SURFACE ANTIBODY,QUALITATIVE: HEP B S AB: NONREACTIVE

## 2017-12-05 LAB — HEPATITIS A ANTIBODY, TOTAL: HEP A TOTAL AB: NEGATIVE

## 2017-12-05 LAB — CULTURE, BLOOD (ROUTINE X 2): SPECIAL REQUESTS: ADEQUATE

## 2017-12-05 LAB — PREGNANCY, URINE: Preg Test, Ur: NEGATIVE

## 2017-12-05 SURGERY — MRI WITH ANESTHESIA
Anesthesia: General

## 2017-12-05 SURGERY — LUMBAR LAMINECTOMY/DECOMPRESSION MICRODISCECTOMY 1 LEVEL
Anesthesia: General | Site: Spine Lumbar

## 2017-12-05 MED ORDER — BISACODYL 10 MG RE SUPP
10.0000 mg | Freq: Every day | RECTAL | Status: DC | PRN
  Administered 2018-01-05 – 2018-01-19 (×2): 10 mg via RECTAL
  Filled 2017-12-05 (×3): qty 1

## 2017-12-05 MED ORDER — SODIUM CHLORIDE 0.9% FLUSH
3.0000 mL | Freq: Two times a day (BID) | INTRAVENOUS | Status: DC
  Administered 2017-12-06: 3 mL via INTRAVENOUS

## 2017-12-05 MED ORDER — OXYCODONE HCL 5 MG PO TABS: 5.0000 mg | ORAL_TABLET | ORAL | Status: DC | PRN

## 2017-12-05 MED ORDER — KETAMINE HCL 10 MG/ML IJ SOLN
INTRAMUSCULAR | Status: DC | PRN
  Administered 2017-12-05 (×2): 20 mg via INTRAVENOUS
  Administered 2017-12-05: 10 mg via INTRAVENOUS

## 2017-12-05 MED ORDER — BUPIVACAINE-EPINEPHRINE (PF) 0.5% -1:200000 IJ SOLN
30.0000 mL | Freq: Once | INTRAMUSCULAR | Status: DC
  Filled 2017-12-05: qty 30

## 2017-12-05 MED ORDER — PHENYLEPHRINE HCL 10 MG/ML IJ SOLN
INTRAMUSCULAR | Status: DC | PRN
  Administered 2017-12-05: 80 ug via INTRAVENOUS

## 2017-12-05 MED ORDER — OXYCODONE HCL 5 MG PO TABS
10.0000 mg | ORAL_TABLET | ORAL | Status: DC | PRN
Start: 2017-12-05 — End: 2017-12-05

## 2017-12-05 MED ORDER — ONDANSETRON HCL 4 MG/2ML IJ SOLN: 4.0000 mg | Freq: Once | INTRAMUSCULAR | Status: DC | PRN

## 2017-12-05 MED ORDER — VANCOMYCIN HCL 1000 MG IV SOLR
INTRAVENOUS | Status: DC | PRN
  Administered 2017-12-05: 1000 mg via TOPICAL

## 2017-12-05 MED ORDER — MIDAZOLAM HCL 2 MG/2ML IJ SOLN
INTRAMUSCULAR | Status: AC
  Filled 2017-12-05: qty 2

## 2017-12-05 MED ORDER — PROMETHAZINE HCL 25 MG/ML IJ SOLN
INTRAMUSCULAR | Status: AC
  Filled 2017-12-05: qty 1

## 2017-12-05 MED ORDER — PHENYLEPHRINE 40 MCG/ML (10ML) SYRINGE FOR IV PUSH (FOR BLOOD PRESSURE SUPPORT)
PREFILLED_SYRINGE | INTRAVENOUS | Status: AC
  Filled 2017-12-05: qty 30

## 2017-12-05 MED ORDER — ROCURONIUM BROMIDE 10 MG/ML (PF) SYRINGE
PREFILLED_SYRINGE | INTRAVENOUS | Status: AC
  Filled 2017-12-05: qty 10

## 2017-12-05 MED ORDER — LIDOCAINE 2% (20 MG/ML) 5 ML SYRINGE
INTRAMUSCULAR | Status: AC
  Filled 2017-12-05: qty 5

## 2017-12-05 MED ORDER — PROMETHAZINE HCL 25 MG/ML IJ SOLN
6.2500 mg | INTRAMUSCULAR | Status: DC | PRN
  Administered 2017-12-05: 12.5 mg via INTRAVENOUS

## 2017-12-05 MED ORDER — VANCOMYCIN HCL IN DEXTROSE 1-5 GM/200ML-% IV SOLN
1000.0000 mg | Freq: Three times a day (TID) | INTRAVENOUS | Status: DC
  Administered 2017-12-06: 1000 mg via INTRAVENOUS
  Filled 2017-12-05 (×2): qty 200

## 2017-12-05 MED ORDER — BUPIVACAINE-EPINEPHRINE (PF) 0.5% -1:200000 IJ SOLN
INTRAMUSCULAR | Status: DC | PRN
  Administered 2017-12-05: 10 mL

## 2017-12-05 MED ORDER — FENTANYL CITRATE (PF) 100 MCG/2ML IJ SOLN
INTRAMUSCULAR | Status: AC
  Filled 2017-12-05: qty 2

## 2017-12-05 MED ORDER — BUPIVACAINE-EPINEPHRINE 0.5% -1:200000 IJ SOLN
50.0000 mL | Freq: Once | INTRAMUSCULAR | Status: DC
  Filled 2017-12-05: qty 50

## 2017-12-05 MED ORDER — KETAMINE HCL 50 MG/5ML IJ SOSY
PREFILLED_SYRINGE | INTRAMUSCULAR | Status: AC
  Filled 2017-12-05: qty 5

## 2017-12-05 MED ORDER — SOD CITRATE-CITRIC ACID 500-334 MG/5ML PO SOLN: 30.0000 mL | Freq: Once | ORAL | Status: DC

## 2017-12-05 MED ORDER — SODIUM CHLORIDE 0.9 % IV SOLN
INTRAVENOUS | Status: DC | PRN
  Administered 2017-12-05 (×2): via INTRAVENOUS

## 2017-12-05 MED ORDER — BACITRACIN ZINC 500 UNIT/GM EX OINT
TOPICAL_OINTMENT | CUTANEOUS | Status: DC | PRN
  Administered 2017-12-05: 1 via TOPICAL

## 2017-12-05 MED ORDER — MENTHOL 3 MG MT LOZG
1.0000 | LOZENGE | OROMUCOSAL | Status: DC | PRN
  Filled 2017-12-05: qty 9

## 2017-12-05 MED ORDER — MORPHINE SULFATE (PF) 4 MG/ML IV SOLN
4.0000 mg | INTRAVENOUS | Status: DC | PRN
  Administered 2017-12-06 – 2017-12-07 (×3): 4 mg via INTRAVENOUS
  Filled 2017-12-05 (×3): qty 1

## 2017-12-05 MED ORDER — FENTANYL CITRATE (PF) 100 MCG/2ML IJ SOLN: 50.0000 ug | Freq: Once | INTRAMUSCULAR | Status: DC

## 2017-12-05 MED ORDER — ARTIFICIAL TEARS OPHTHALMIC OINT
TOPICAL_OINTMENT | OPHTHALMIC | Status: AC
  Filled 2017-12-05: qty 3.5

## 2017-12-05 MED ORDER — BUPIVACAINE LIPOSOME 1.3 % IJ SUSP
20.0000 mL | Freq: Once | INTRAMUSCULAR | Status: DC
  Filled 2017-12-05: qty 20

## 2017-12-05 MED ORDER — SUCCINYLCHOLINE 20MG/ML (10ML) SYRINGE FOR MEDFUSION PUMP - OPTIME
INTRAMUSCULAR | Status: DC | PRN
  Administered 2017-12-05: 140 mg via INTRAVENOUS

## 2017-12-05 MED ORDER — 0.9 % SODIUM CHLORIDE (POUR BTL) OPTIME
TOPICAL | Status: DC | PRN
  Administered 2017-12-05: 1000 mL

## 2017-12-05 MED ORDER — VANCOMYCIN HCL 10 G IV SOLR
1750.0000 mg | Freq: Once | INTRAVENOUS | Status: AC
  Administered 2017-12-06: 1750 mg via INTRAVENOUS
  Filled 2017-12-05: qty 1750

## 2017-12-05 MED ORDER — HYDROMORPHONE HCL 1 MG/ML IJ SOLN
0.5000 mg | INTRAMUSCULAR | Status: DC | PRN
  Administered 2017-12-05 (×2): 0.5 mg via INTRAVENOUS

## 2017-12-05 MED ORDER — ONDANSETRON HCL 4 MG/2ML IJ SOLN
4.0000 mg | Freq: Four times a day (QID) | INTRAMUSCULAR | Status: DC | PRN
  Administered 2017-12-06: 4 mg via INTRAVENOUS
  Filled 2017-12-05: qty 2

## 2017-12-05 MED ORDER — LACTATED RINGERS IV SOLN: 100.0000 mL/h | INTRAVENOUS | Status: DC

## 2017-12-05 MED ORDER — FENTANYL CITRATE (PF) 100 MCG/2ML IJ SOLN
25.0000 ug | INTRAMUSCULAR | Status: DC | PRN
  Administered 2017-12-05 (×3): 50 ug via INTRAVENOUS

## 2017-12-05 MED ORDER — FENTANYL CITRATE (PF) 250 MCG/5ML IJ SOLN
INTRAMUSCULAR | Status: DC | PRN
  Administered 2017-12-05: 100 ug via INTRAVENOUS
  Administered 2017-12-05 (×3): 50 ug via INTRAVENOUS

## 2017-12-05 MED ORDER — FENTANYL CITRATE (PF) 250 MCG/5ML IJ SOLN
INTRAMUSCULAR | Status: AC
  Filled 2017-12-05: qty 5

## 2017-12-05 MED ORDER — LIDOCAINE HCL (CARDIAC) PF 100 MG/5ML IV SOSY
PREFILLED_SYRINGE | INTRAVENOUS | Status: DC | PRN
  Administered 2017-12-05: 100 mg via INTRATRACHEAL

## 2017-12-05 MED ORDER — ORAL CARE MOUTH RINSE: 15.0000 mL | Freq: Two times a day (BID) | OROMUCOSAL | Status: DC

## 2017-12-05 MED ORDER — SUGAMMADEX SODIUM 500 MG/5ML IV SOLN
INTRAVENOUS | Status: AC
  Filled 2017-12-05: qty 5

## 2017-12-05 MED ORDER — VANCOMYCIN HCL 1000 MG IV SOLR
INTRAVENOUS | Status: AC
  Filled 2017-12-05: qty 1000

## 2017-12-05 MED ORDER — ACETAMINOPHEN 325 MG PO TABS
650.0000 mg | ORAL_TABLET | ORAL | Status: DC | PRN
  Administered 2017-12-07 – 2017-12-14 (×7): 650 mg via ORAL
  Filled 2017-12-05 (×7): qty 2

## 2017-12-05 MED ORDER — HYDROMORPHONE HCL 1 MG/ML IJ SOLN
INTRAMUSCULAR | Status: AC
  Filled 2017-12-05: qty 1

## 2017-12-05 MED ORDER — ACETAMINOPHEN 650 MG RE SUPP: 650.0000 mg | RECTAL | Status: DC | PRN

## 2017-12-05 MED ORDER — SODIUM CHLORIDE 0.9 % IV SOLN: 250.0000 mL | INTRAVENOUS | Status: DC

## 2017-12-05 MED ORDER — GADOBENATE DIMEGLUMINE 529 MG/ML IV SOLN
17.0000 mL | Freq: Once | INTRAVENOUS | Status: AC | PRN
  Administered 2017-12-05: 17 mL via INTRAVENOUS

## 2017-12-05 MED ORDER — PROPOFOL 10 MG/ML IV BOLUS
INTRAVENOUS | Status: DC | PRN
  Administered 2017-12-05: 120 mg via INTRAVENOUS

## 2017-12-05 MED ORDER — ONDANSETRON HCL 4 MG PO TABS: 4.0000 mg | ORAL_TABLET | Freq: Four times a day (QID) | ORAL | Status: DC | PRN

## 2017-12-05 MED ORDER — FENTANYL CITRATE (PF) 100 MCG/2ML IJ SOLN
50.0000 ug | Freq: Once | INTRAMUSCULAR | Status: AC
  Administered 2017-12-05: 50 ug via INTRAVENOUS

## 2017-12-05 MED ORDER — DOCUSATE SODIUM 100 MG PO CAPS
100.0000 mg | ORAL_CAPSULE | Freq: Two times a day (BID) | ORAL | Status: DC
  Administered 2017-12-05: 100 mg via ORAL

## 2017-12-05 MED ORDER — SODIUM CHLORIDE 0.9 % IV SOLN
2.0000 g | INTRAVENOUS | Status: DC
  Administered 2017-12-05: 2 g via INTRAVENOUS
  Filled 2017-12-05: qty 20

## 2017-12-05 MED ORDER — PROPOFOL 10 MG/ML IV BOLUS
INTRAVENOUS | Status: AC
  Filled 2017-12-05: qty 20

## 2017-12-05 MED ORDER — FENTANYL CITRATE (PF) 100 MCG/2ML IJ SOLN
INTRAMUSCULAR | Status: AC
  Administered 2017-12-05: 100 ug via INTRAVENOUS
  Filled 2017-12-05: qty 2

## 2017-12-05 MED ORDER — MEPERIDINE HCL 50 MG/ML IJ SOLN: 6.2500 mg | INTRAMUSCULAR | Status: DC | PRN

## 2017-12-05 MED ORDER — MIDAZOLAM HCL 2 MG/2ML IJ SOLN
INTRAMUSCULAR | Status: DC | PRN
  Administered 2017-12-05: 2 mg via INTRAVENOUS

## 2017-12-05 MED ORDER — SUCCINYLCHOLINE CHLORIDE 200 MG/10ML IV SOSY
PREFILLED_SYRINGE | INTRAVENOUS | Status: AC
  Filled 2017-12-05: qty 10

## 2017-12-05 MED ORDER — SODIUM CHLORIDE 0.9% FLUSH: 3.0000 mL | INTRAVENOUS | Status: DC | PRN

## 2017-12-05 MED ORDER — ZOLPIDEM TARTRATE 5 MG PO TABS
5.0000 mg | ORAL_TABLET | Freq: Every evening | ORAL | Status: DC | PRN
  Administered 2017-12-06 – 2018-01-29 (×47): 5 mg via ORAL
  Filled 2017-12-05 (×48): qty 1

## 2017-12-05 MED ORDER — PHENOL 1.4 % MT LIQD: 1.0000 | OROMUCOSAL | Status: DC | PRN

## 2017-12-05 MED ORDER — SODIUM CHLORIDE 0.9 % IV SOLN
INTRAVENOUS | Status: DC | PRN
  Administered 2017-12-05: 20:00:00

## 2017-12-05 MED ORDER — THROMBIN 20000 UNITS EX SOLR
CUTANEOUS | Status: DC | PRN
  Administered 2017-12-05: 20:00:00 via TOPICAL

## 2017-12-05 MED ORDER — BACITRACIN ZINC 500 UNIT/GM EX OINT
TOPICAL_OINTMENT | CUTANEOUS | Status: AC
  Filled 2017-12-05: qty 28.35

## 2017-12-05 MED ORDER — FENTANYL CITRATE (PF) 100 MCG/2ML IJ SOLN
100.0000 ug | Freq: Once | INTRAMUSCULAR | Status: AC
  Administered 2017-12-05: 100 ug via INTRAVENOUS

## 2017-12-05 MED ORDER — CYCLOBENZAPRINE HCL 10 MG PO TABS
10.0000 mg | ORAL_TABLET | Freq: Three times a day (TID) | ORAL | Status: DC | PRN
  Administered 2017-12-06 – 2017-12-14 (×13): 10 mg via ORAL
  Filled 2017-12-05 (×13): qty 1

## 2017-12-05 MED ORDER — LACTATED RINGERS IV SOLN
INTRAVENOUS | Status: DC
  Administered 2017-12-08 – 2018-01-23 (×10): via INTRAVENOUS

## 2017-12-05 MED ORDER — THROMBIN (RECOMBINANT) 20000 UNITS EX SOLR
CUTANEOUS | Status: AC
  Filled 2017-12-05: qty 20000

## 2017-12-05 MED ORDER — ONDANSETRON HCL 4 MG/2ML IJ SOLN
INTRAMUSCULAR | Status: DC | PRN
  Administered 2017-12-05: 4 mg via INTRAVENOUS

## 2017-12-05 MED ORDER — THROMBIN 5000 UNITS EX SOLR
CUTANEOUS | Status: AC
  Filled 2017-12-05: qty 5000

## 2017-12-05 MED ORDER — SUGAMMADEX SODIUM 500 MG/5ML IV SOLN
INTRAVENOUS | Status: DC | PRN
  Administered 2017-12-05: 200 mg via INTRAVENOUS

## 2017-12-05 MED ORDER — HYDROMORPHONE HCL 1 MG/ML IJ SOLN
0.2500 mg | INTRAMUSCULAR | Status: DC | PRN
  Administered 2017-12-05 (×4): 0.5 mg via INTRAVENOUS

## 2017-12-05 MED ORDER — LIDOCAINE 2% (20 MG/ML) 5 ML SYRINGE
INTRAMUSCULAR | Status: AC
  Filled 2017-12-05: qty 10

## 2017-12-05 MED ORDER — ACETAMINOPHEN 500 MG PO TABS
1000.0000 mg | ORAL_TABLET | Freq: Four times a day (QID) | ORAL | Status: AC
  Administered 2017-12-06 (×4): 1000 mg via ORAL
  Filled 2017-12-05 (×4): qty 2

## 2017-12-05 MED ORDER — ROCURONIUM 10MG/ML (10ML) SYRINGE FOR MEDFUSION PUMP - OPTIME
INTRAVENOUS | Status: DC | PRN
  Administered 2017-12-05: 40 mg via INTRAVENOUS

## 2017-12-05 SURGICAL SUPPLY — 45 items
BAG DECANTER FOR FLEXI CONT (MISCELLANEOUS) ×3 IMPLANT
BENZOIN TINCTURE PRP APPL 2/3 (GAUZE/BANDAGES/DRESSINGS) ×3 IMPLANT
BUR MATCHSTICK NEURO 3.0 LAGG (BURR) ×3 IMPLANT
BUR PRECISION FLUTE 6.0 (BURR) ×3 IMPLANT
CANISTER SUCT 3000ML PPV (MISCELLANEOUS) ×3 IMPLANT
CARTRIDGE OIL MAESTRO DRILL (MISCELLANEOUS) ×1 IMPLANT
CLOSURE WOUND 1/2 X4 (GAUZE/BANDAGES/DRESSINGS) ×1
DIFFUSER DRILL AIR PNEUMATIC (MISCELLANEOUS) ×3 IMPLANT
DRAPE LAPAROTOMY 100X72X124 (DRAPES) ×3 IMPLANT
DRAPE MICROSCOPE LEICA (MISCELLANEOUS) IMPLANT
DRAPE POUCH INSTRU U-SHP 10X18 (DRAPES) ×3 IMPLANT
DRAPE SURG 17X23 STRL (DRAPES) ×12 IMPLANT
DRSG OPSITE POSTOP 4X8 (GAUZE/BANDAGES/DRESSINGS) ×3 IMPLANT
ELECT BLADE 4.0 EZ CLEAN MEGAD (MISCELLANEOUS) ×3
ELECT REM PT RETURN 9FT ADLT (ELECTROSURGICAL) ×3
ELECTRODE BLDE 4.0 EZ CLN MEGD (MISCELLANEOUS) ×1 IMPLANT
ELECTRODE REM PT RTRN 9FT ADLT (ELECTROSURGICAL) ×1 IMPLANT
EVACUATOR 1/8 PVC DRAIN (DRAIN) ×3 IMPLANT
GAUZE SPONGE 4X4 12PLY STRL (GAUZE/BANDAGES/DRESSINGS) ×3 IMPLANT
GAUZE SPONGE 4X4 16PLY XRAY LF (GAUZE/BANDAGES/DRESSINGS) IMPLANT
GLOVE BIO SURGEON STRL SZ8 (GLOVE) ×3 IMPLANT
GLOVE BIO SURGEON STRL SZ8.5 (GLOVE) ×3 IMPLANT
GOWN STRL REUS W/ TWL LRG LVL3 (GOWN DISPOSABLE) IMPLANT
GOWN STRL REUS W/ TWL XL LVL3 (GOWN DISPOSABLE) ×2 IMPLANT
GOWN STRL REUS W/TWL 2XL LVL3 (GOWN DISPOSABLE) ×6 IMPLANT
GOWN STRL REUS W/TWL LRG LVL3 (GOWN DISPOSABLE)
GOWN STRL REUS W/TWL XL LVL3 (GOWN DISPOSABLE) ×4
KIT BASIN OR (CUSTOM PROCEDURE TRAY) ×3 IMPLANT
KIT TURNOVER KIT B (KITS) ×3 IMPLANT
NEEDLE HYPO 21X1.5 SAFETY (NEEDLE) ×3 IMPLANT
NEEDLE HYPO 22GX1.5 SAFETY (NEEDLE) ×3 IMPLANT
NS IRRIG 1000ML POUR BTL (IV SOLUTION) ×3 IMPLANT
OIL CARTRIDGE MAESTRO DRILL (MISCELLANEOUS) ×3
PACK LAMINECTOMY NEURO (CUSTOM PROCEDURE TRAY) ×3 IMPLANT
PAD ARMBOARD 7.5X6 YLW CONV (MISCELLANEOUS) ×9 IMPLANT
PATTIES SURGICAL .5 X1 (DISPOSABLE) IMPLANT
RUBBERBAND STERILE (MISCELLANEOUS) IMPLANT
SPONGE SURGIFOAM ABS GEL 100 (HEMOSTASIS) ×3 IMPLANT
STRIP CLOSURE SKIN 1/2X4 (GAUZE/BANDAGES/DRESSINGS) ×2 IMPLANT
SUT VIC AB 1 CT1 18XBRD ANBCTR (SUTURE) ×1 IMPLANT
SUT VIC AB 1 CT1 8-18 (SUTURE) ×2
SUT VIC AB 2-0 CP2 18 (SUTURE) ×3 IMPLANT
TOWEL GREEN STERILE (TOWEL DISPOSABLE) ×3 IMPLANT
TOWEL GREEN STERILE FF (TOWEL DISPOSABLE) ×3 IMPLANT
WATER STERILE IRR 1000ML POUR (IV SOLUTION) ×3 IMPLANT

## 2017-12-05 NOTE — OR Nursing (Signed)
[facility] at bedside to take pt back to OR

## 2017-12-05 NOTE — Progress Notes (Signed)
[facility] picked up patient for transport back to [facility], received a phone call initially from a nurse at [facility] asking that the patient stay at [facility] per verbal order from Dr. Arnoldo Morale, [facility] called to transport patient back to PACU for surgery, patient arrived back in PACU with [facility] and transferred from stretcher to bed, patient to have surgery with Dr. Arnoldo Morale today 7/25, spoke with [facility] regarding bed placement, request bed for 4NP-stepdown, Dr. Arnoldo Morale currently at bedside evaluating patient and discussing the MRI with the patient.  Gwenlyn Fudge, RN

## 2017-12-05 NOTE — Progress Notes (Signed)
Subjective: The patient complains of back pain.  Objective: Vital signs in last 24 hours: Temp:  [97.5 F (36.4 C)-98.6 F (37 C)] 98.5 F (36.9 C) (07/25 1805) Pulse Rate:  [82-100] 100 (07/25 2122) Resp:  [16-31] 17 (07/25 2152) BP: (101-137)/(57-114) 122/82 (07/25 2152) SpO2:  [93 %-100 %] 100 % (07/25 2122) Weight:  [77.1 kg (170 lb)] 77.1 kg (170 lb) (07/25 1128) Estimated body mass index is 26.63 kg/m as calculated from the following:   Height as of this encounter: 5' 7"  (1.702 m).   Weight as of this encounter: 77.1 kg (170 lb).   Intake/Output from previous day: 07/24 0701 - 07/25 0700 In: 3956.7 [P.O.:1420; I.V.:2406.7; IV Piggyback:130] Out: 300 [Urine:300] Intake/Output this shift: Total I/O In: 1500 [I.V.:1500] Out: 300 [Urine:200; Blood:100]  Physical exam the patient is alert.  Her strength is a bit better.  The exam is limited because of her degree of cooperativeness.  She appears to have approximately 4-/5 bilateral gastrocnemius strength  Lab Results: Recent Labs    12/04/17 0538 12/05/17 0324  WBC 14.9* 11.1*  HGB 8.7* 7.2*  HCT 27.1* 23.1*  PLT 444* 419*   BMET Recent Labs    12/04/17 0538 12/05/17 0324  NA 136 138  K 4.0 4.1  CL 101 106  CO2 26 24  GLUCOSE 135* 100*  BUN 12 11  CREATININE 0.68 0.57  CALCIUM 8.9 8.7*    Studies/Results: Mr Thoracic Spine W Wo Contrast  Result Date: 12/05/2017 CLINICAL DATA:  Fevers, chills, endocarditis resulting and pulmonary abscess. History of polysubstance abuse and scoliosis. EXAM: MRI THORACIC AND LUMBAR SPINE WITHOUT AND WITH CONTRAST TECHNIQUE: Multiplanar and multiecho pulse sequences of the thoracic and lumbar spine were obtained without and with intravenous contrast. CONTRAST:  54m MULTIHANCE GADOBENATE DIMEGLUMINE 529 MG/ML IV SOLN COMPARISON:  MRI of the thoracic and lumbar spine November 21, 2017. FINDINGS: MRI THORACIC SPINE FINDINGS-spinal hardware resulting in extensive susceptibility  artifact. ALIGNMENT: Straightened of the thoracic kyphosis. No malalignment. Scoliosis on this nonweightbearing examination. VERTEBRAE/DISCS: T3 through lumbar Harrington rods with laminar hooks. Vertebral bodies are intact. Intervertebral discs morphology and signal are normal though limited assessment. No abnormal bone marrow signal though limited STIR sequence. Vertebral bodies predominance cured on post gadolinium T1 sequence. CORD: Thoracic spinal cord is normal morphology and signal characteristics to the level of the conus medullaris which terminates at T12-L1. PREVERTEBRAL AND PARASPINAL SOFT TISSUES: Mild paraspinal muscle atrophy with no suspicious signal. Small RIGHT pleural effusion and multifocal consolidation. DISC LEVELS: No disc bulge, canal stenosis of upper thoracic spine to T3-4, below levels predominant obscured. MRI LUMBAR SPINE FINDINGS--spinal hardware resulting in extensive susceptibility artifact. SEGMENTATION: For the purposes of this report, the last well-formed intervertebral disc is reported as L5-S1. ALIGNMENT: Maintained lumbar lordosis. No malalignment. Thoracic through L3 Harrington rods with laminar hooks. VERTEBRAE:Vertebral bodies are intact. Moderate L4-5 and L5-S1 disc height loss with increasing disc edema, no enhancement. No suspicious vertebral body enhancement. Abnormal signal enhancement LEFT sacrum. CONUS MEDULLARIS AND CAUDA EQUINA: Conus medullaris obscured. Thecal sac effacement due to epidural collection with rim enhancement measuring to 17 x 9 mm (transverse by AP) at L4-5. Thecal sac is to 4 mm in AP dimension at L4-5. PARASPINAL AND OTHER SOFT TISSUES: Bright STIR signal in suspected enhancement paraspinal muscles lower lumbar spine. 2 cm probable fluid collection LEFT paraspinal muscles at L4-5. Abnormal signal enhancement L4-5 and possibly L5-S1 facets. DISC LEVELS: L1-2 and L2-3: Obscured. L3-4: Thecal sac effacement due to  epidural collection. Severe facet  arthropathy with abnormal enhancement. No osseous canal stenosis or neural foraminal narrowing. L4-5: Small broad-based disc bulge asymmetric to the LEFT. Thecal sac effacement due to epidural collection. Moderate facet arthropathy and ligamentum flavum redundancy. Mild neural foraminal narrowing. L5-S1: 6 x 7 mm RIGHT central disc extrusion versus abscess displacing the traversing RIGHT S1 nerve. Annular fissure versus annular enhancement from infection. Moderate RIGHT and severe LEFT facet arthropathy without canal stenosis. Moderate RIGHT, moderate to severe LEFT neural foraminal narrowing. IMPRESSION: MRI thoracic spine: 1. Scoliosis and posterior spinal fixation rods limiting assessment. 2. Stable appearance without imaging evidence of acute osseous process or infection in the spine. 3. Bilateral lung consolidation consistent with history of septic emboli. Small RIGHT pleural effusion. MRI lumbar spine: 1. Scoliosis and posterior spinal fixation rods limiting assessment. 2. New epidural abscess lower lumbar spine effacing thecal sac. 3. Suspected lower lumbar facet septic arthropathy. Probable paraspinal myositis and 2 cm abscess. 4. L5-S1 6 x 7 mm disc extrusion versus abscess. Equivocal L5-S1 discitis. 5. 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 These results will be called to the ordering clinician or representative by the Radiologist Assistant, and communication documented in the PACS or zVision Dashboard. Electronically Signed   By: Elon Alas M.D.   On: 12/05/2017 17:18   Mr Lumbar Spine W Wo Contrast  Result Date: 12/05/2017 CLINICAL DATA:  Fevers, chills, endocarditis resulting and pulmonary abscess. History of polysubstance abuse and scoliosis. EXAM: MRI THORACIC AND LUMBAR SPINE WITHOUT AND WITH CONTRAST TECHNIQUE: Multiplanar and multiecho pulse sequences of the thoracic and lumbar spine were obtained without and with intravenous contrast. CONTRAST:  79m MULTIHANCE GADOBENATE  DIMEGLUMINE 529 MG/ML IV SOLN COMPARISON:  MRI of the thoracic and lumbar spine November 21, 2017. FINDINGS: MRI THORACIC SPINE FINDINGS-spinal hardware resulting in extensive susceptibility artifact. ALIGNMENT: Straightened of the thoracic kyphosis. No malalignment. Scoliosis on this nonweightbearing examination. VERTEBRAE/DISCS: T3 through lumbar Harrington rods with laminar hooks. Vertebral bodies are intact. Intervertebral discs morphology and signal are normal though limited assessment. No abnormal bone marrow signal though limited STIR sequence. Vertebral bodies predominance cured on post gadolinium T1 sequence. CORD: Thoracic spinal cord is normal morphology and signal characteristics to the level of the conus medullaris which terminates at T12-L1. PREVERTEBRAL AND PARASPINAL SOFT TISSUES: Mild paraspinal muscle atrophy with no suspicious signal. Small RIGHT pleural effusion and multifocal consolidation. DISC LEVELS: No disc bulge, canal stenosis of upper thoracic spine to T3-4, below levels predominant obscured. MRI LUMBAR SPINE FINDINGS--spinal hardware resulting in extensive susceptibility artifact. SEGMENTATION: For the purposes of this report, the last well-formed intervertebral disc is reported as L5-S1. ALIGNMENT: Maintained lumbar lordosis. No malalignment. Thoracic through L3 Harrington rods with laminar hooks. VERTEBRAE:Vertebral bodies are intact. Moderate L4-5 and L5-S1 disc height loss with increasing disc edema, no enhancement. No suspicious vertebral body enhancement. Abnormal signal enhancement LEFT sacrum. CONUS MEDULLARIS AND CAUDA EQUINA: Conus medullaris obscured. Thecal sac effacement due to epidural collection with rim enhancement measuring to 17 x 9 mm (transverse by AP) at L4-5. Thecal sac is to 4 mm in AP dimension at L4-5. PARASPINAL AND OTHER SOFT TISSUES: Bright STIR signal in suspected enhancement paraspinal muscles lower lumbar spine. 2 cm probable fluid collection LEFT paraspinal  muscles at L4-5. Abnormal signal enhancement L4-5 and possibly L5-S1 facets. DISC LEVELS: L1-2 and L2-3: Obscured. L3-4: Thecal sac effacement due to epidural collection. Severe facet arthropathy with abnormal enhancement. No osseous canal stenosis or neural foraminal narrowing. L4-5: Small broad-based disc  bulge asymmetric to the LEFT. Thecal sac effacement due to epidural collection. Moderate facet arthropathy and ligamentum flavum redundancy. Mild neural foraminal narrowing. L5-S1: 6 x 7 mm RIGHT central disc extrusion versus abscess displacing the traversing RIGHT S1 nerve. Annular fissure versus annular enhancement from infection. Moderate RIGHT and severe LEFT facet arthropathy without canal stenosis. Moderate RIGHT, moderate to severe LEFT neural foraminal narrowing. IMPRESSION: MRI thoracic spine: 1. Scoliosis and posterior spinal fixation rods limiting assessment. 2. Stable appearance without imaging evidence of acute osseous process or infection in the spine. 3. Bilateral lung consolidation consistent with history of septic emboli. Small RIGHT pleural effusion. MRI lumbar spine: 1. Scoliosis and posterior spinal fixation rods limiting assessment. 2. New epidural abscess lower lumbar spine effacing thecal sac. 3. Suspected lower lumbar facet septic arthropathy. Probable paraspinal myositis and 2 cm abscess. 4. L5-S1 6 x 7 mm disc extrusion versus abscess. Equivocal L5-S1 discitis. 5. 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 These results will be called to the ordering clinician or representative by the Radiologist Assistant, and communication documented in the PACS or zVision Dashboard. Electronically Signed   By: Elon Alas M.D.   On: 12/05/2017 17:18    Assessment/Plan: The patient is doing well.  I spoke with her mother.  LOS: 3 days     Kelly [name] 12/05/2017, 10:04 PM

## 2017-12-05 NOTE — Op Note (Signed)
Brief history: The patient is a 32 year old white female intravenous drug abuser with a prior history of scoliosis status post Harrington rod placement as a child. She was admitted with back pain and sepsis. A lumbar MRI demonstrated a lumbar epidural abscess. I discussed the situation with the patient and her mother. I recommended surgery. She has weighed the risks, benefits, and alternative surgery and decided to proceed with the operation.  Preoperative diagnosis:lumbar epidural abscess  Postoperative diagnosis: The same  Procedure: L3, L4, L5 and S1 laminectomy for drainage of lumbar epidural abscess  Surgeon: Dr. Earle Gell  Assistant: None  Anesthesia Gen. Endotracheal  Assessment boluses 200 mL  Specimens: Epidural cultures  Drains: One medium Hemovac drain in the epidural space  Outpatient: None  Description of procedure: The patient was brought to the operating room by the anesthesia team. Gen. Endotracheal anesthesia was induced. The patient was turned to the prone position on the Wilson frame. Her lumbosacral region was then prepared with Betadine scrub and Betadine solution. Sterile drapes were applied. I then injected the area to be incised with Marcaine with epinephrine solution. I just scalpel to make a linear midline incision over the L3-4, L4-5 and L5-S1 interspaces. I used electrocautery to perform a bilateral subperiosteal dissection exposing the spinous process and lamina of L3, L4, L5 and the upper sacrum. We obtained intraoperative radiograph to confirm our location. I inserted the McCullough retractor for exposure.  I began the decompression by incising the L2-3, L3-4, L4-5 and L5-S1 interspinous ligament. I used the rongeurs to remove the spinous process at L3, L4 and L5. I then used a high-speed drill to create bilateral laminotomies at L3-4, L4-5 and L5-S1. I thinned out the lamina with the drill. I then used the Kerrison punches to complete the L3-4, L4-5 and  L5-S1 laminectomy removing the ligamentum flavum as well. We encountered quite a bit of pus in the epidural space.I cultured the pus in the epidural space. I drained the pus was suctioned and irrigation. I performed foraminotomies about the bilateral L3, L4, L5 and S1 nerve roots. There was some more pus tracking into the sacral regions were removed the cephalad aspect of the S1 lamina. After I had drained all the pus I could see I irrigated the wound out with bacitracin solution. We obtained hemostasis with bipolar cautery. We then removed the retractors. I then inserted a medium Hemovac drain in the epidural space and tunneled it out through a separate stab wound. Then reapproximated the correct lumbar fashion with interrupted #1 Vicryl suture. I reapproximated the subcutaneous tissue with interrupted 2-0 Vicryl suture. I reapproximated the skin with Steri-Strips and benzoin. The wound was then coated with bacitracin ointment. A sterile dressing was applied. The drapes were removed. The patient was sent to return to the supine position. By report all sponge, instrument, and needle counts were correct at the end of this case.

## 2017-12-05 NOTE — Anesthesia Preprocedure Evaluation (Addendum)
Anesthesia Evaluation  Patient identified by MRN, date of birth, ID band Patient awake    Reviewed: Allergy & Precautions, NPO status , Patient's Chart, lab work & pertinent test results  Airway Mallampati: I  TM Distance: >3 FB Neck ROM: Full    Dental   Pulmonary Current Smoker,    Pulmonary exam normal        Cardiovascular hypertension, Pt. on medications Normal cardiovascular exam  ECHO 11/2017 Study Conclusions  - Left ventricle: The cavity size was normal. Systolic function was   normal. The estimated ejection fraction was in the range of 55%   to 60%. Wall motion was normal; there were no regional wall   motion abnormalities. Left ventricular diastolic function   parameters were normal. - Aortic valve: Transvalvular velocity was within the normal range.   There was no stenosis. There was no regurgitation. - Mitral valve: Thickening of the anterior leaflet. Transvalvular   velocity was within the normal range. There was no evidence for   stenosis. There was trivial regurgitation. - Left atrium: The atrium was mildly dilated. - Right ventricle: The cavity size was normal. Wall thickness was   normal. Systolic function was normal. - Right atrium: The atrium was severely dilated. - Tricuspid valve: There was a medium-sized, 1.0 cm (W) x 1.7 cm   (L) vegetation on the septal leaflet. There was severe   regurgitation. - Pulmonary arteries: Systolic pressure was mildly increased. PA   peak pressure: 42 mm Hg (S).  Impressions:  - Vegetation noted on the septal leaflet of the tricuspid valve   consistent with endocarditis. Cannot exlude vegetation on the   anterior leaflet of the mitral valve. Suggest TEE to better   assess.   Neuro/Psych Anxiety Depression Bipolar Disorder    GI/Hepatic   Endo/Other    Renal/GU      Musculoskeletal   Abdominal   Peds  Hematology   Anesthesia Other Findings   Reproductive/Obstetrics                           Anesthesia Physical Anesthesia Plan  ASA: III  Anesthesia Plan: General   Post-op Pain Management:    Induction: Cricoid pressure planned, Rapid sequence and Intravenous  PONV Risk Score and Plan: 2 and Ondansetron and Treatment may vary due to age or medical condition  Airway Management Planned: Oral ETT  Additional Equipment:   Intra-op Plan:   Post-operative Plan: Extubation in OR  Informed Consent: I have reviewed the patients History and Physical, chart, labs and discussed the procedure including the risks, benefits and alternatives for the proposed anesthesia with the patient or authorized representative who has indicated his/her understanding and acceptance.     Plan Discussed with: Surgeon and CRNA  Anesthesia Plan Comments:         Anesthesia Quick Evaluation

## 2017-12-05 NOTE — Transfer of Care (Signed)
Immediate Anesthesia Transfer of Care Note  Patient: Kelly [name]  Procedure(s) Performed: MRI WITH ANESTHESIA OF THORCIC AND LUMBAR SPINE WITH AND WITHOUT (N/A )  Patient Location: PACU  Anesthesia Type:General  Level of Consciousness: awake and alert   Airway & Oxygen Therapy: Patient Spontanous Breathing and Patient connected to nasal cannula oxygen  Post-op Assessment: Report given to RN, Post -op Vital signs reviewed and stable and Patient moving all extremities  Post vital signs: Reviewed and stable  Last Vitals:  Vitals Value Taken Time  BP 101/60 12/05/2017  3:56 PM  Temp 36.4 C 12/05/2017  3:50 PM  Pulse 101 12/05/2017  3:57 PM  Resp 23 12/05/2017  3:57 PM  SpO2 92 % 12/05/2017  3:57 PM  Vitals shown include unvalidated device data.  Last Pain:  Vitals:   12/05/17 1550  TempSrc:   PainSc: Asleep      Patients Stated Pain Goal: 3 (29/79/89 2119)  Complications: No apparent anesthesia complications

## 2017-12-05 NOTE — Anesthesia Preprocedure Evaluation (Signed)
Anesthesia Evaluation  Patient identified by MRN, date of birth, ID band Patient awake    Reviewed: Allergy & Precautions, NPO status , Patient's Chart, lab work & pertinent test results  History of Anesthesia Complications Negative for: history of anesthetic complications  Airway Mallampati: II  TM Distance: >3 FB Neck ROM: Full    Dental  (+) Teeth Intact   Pulmonary pneumonia, Current Smoker,    + rhonchi        Cardiovascular hypertension, negative cardio ROS   Rhythm:Regular     Neuro/Psych PSYCHIATRIC DISORDERS Bipolar Disorder negative neurological ROS     GI/Hepatic negative GI ROS, (+)     substance abuse  IV drug use,   Endo/Other    Renal/GU      Musculoskeletal   Abdominal   Peds  Hematology   Anesthesia Other Findings   Reproductive/Obstetrics (+) Pregnancy                             Anesthesia Physical  Anesthesia Plan  ASA: III  Anesthesia Plan: General   Post-op Pain Management:    Induction: Intravenous  PONV Risk Score and Plan: 3 and Ondansetron and Treatment may vary due to age or medical condition  Airway Management Planned: LMA and Oral ETT  Additional Equipment: None  Intra-op Plan:   Post-operative Plan: Extubation in OR  Informed Consent: I have reviewed the patients History and Physical, chart, labs and discussed the procedure including the risks, benefits and alternatives for the proposed anesthesia with the patient or authorized representative who has indicated his/her understanding and acceptance.   Dental advisory given  Plan Discussed with: CRNA, Surgeon and Anesthesiologist  Anesthesia Plan Comments: (  )        Anesthesia Quick Evaluation

## 2017-12-05 NOTE — Progress Notes (Signed)
Pt. Staying at [facility] due to MRI results. Mother will come to pick up pt. Belongings at [facility] ICU.

## 2017-12-05 NOTE — Anesthesia Procedure Notes (Signed)
Procedure Name: Intubation Date/Time: 12/05/2017 7:22 PM Performed by: Valetta Fuller, CRNA Pre-anesthesia Checklist: Patient identified, Emergency Drugs available, Suction available and Patient being monitored Patient Re-evaluated:Patient Re-evaluated prior to induction Oxygen Delivery Method: Circle system utilized Preoxygenation: Pre-oxygenation with 100% oxygen Induction Type: IV induction, Rapid sequence and Cricoid Pressure applied Laryngoscope Size: Miller and 2 Grade View: Grade I Tube type: Oral Tube size: 7.5 mm Number of attempts: 1 Airway Equipment and Method: Stylet Placement Confirmation: ETT inserted through vocal cords under direct vision,  positive ETCO2 and breath sounds checked- equal and bilateral Secured at: 23 cm Tube secured with: Tape Dental Injury: Teeth and Oropharynx as per pre-operative assessment

## 2017-12-05 NOTE — Progress Notes (Signed)
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   Assessment & Plan:   Active Problems:   Cocaine abuse (HCC)   Polysubstance abuse (HCC)   Sepsis (HCC)   Febrile illness   Difficult intravenous access   Leukocytosis   Intravenous drug abuse ([place])   AKI (acute kidney injury) ([place])   #) MSSA tricuspid valve endocarditis: Noted on TTE obtained on 12/03/2017, likely secondary to IV drug use. -Continue IV cefazolin and started 12/03/2017 -Repeat blood cultures 12/04/2017 no growth to date -Blood cultures positive for MSSA on 12/02/2017 on admission -We will discuss with cardiology about need for TEE  -Infectious disease following appreciate recommendations -HIV, RPR, hepatitis B, A, C, GC chlamydia pending  #) AKI: Resolved  #) History of scoliosis status post rod placement/severe back pain: Patient's severe back pain has improved somewhat however she continues to have lower extremity weakness and numbness and tingling.  Unfortunately patient was unable to tolerate sedated MRI yesterday. -Plan for sedated MRI of thoracic and lumbar spine with and without contrast today - IV hydromorphone 2 mg every 3 hours as needed -Continue methadone per below - Continue methocarbamol 500 mg every 8 as needed as needed -Continue oxycodone as needed -Continue IV Toradol every 6 hours as needed  #) IV drug use: -Continue methadone 40 mg daily ( patient was on 80 mg as an outpatient but had not been seen in the clinic for several weeks) -continue clonidine for withdrawal  Fluids: Gentle IV fluids Electrodes:  Monitor and supplement Nutrition: Regular diet  Prophylaxis: Subcu heparin  Disposition: Pending antibiotic plan and evaluation of spine  Full code    Consultants:   Infectious disease  Cardiology  Procedures:  Echo 12/03/2017:Impressions:  - Vegetation noted on the septal leaflet of the tricuspid valve   consistent with endocarditis. Cannot exlude vegetation on the   anterior leaflet of the mitral valve. Suggest TEE to better    assess.  Antimicrobials:  IV cefazolin started 12/02/2017   Subjective: Patient reports that her pain is improved somewhat.  She continues to have numbness and tingling down her legs.  She continues to report some difficulty with incontinence.  She has also endorses lower extremity weakness.  She denies any chest pain, nausea, vomiting, diarrhea.  Objective: Vitals:   12/05/17 0600 12/05/17 0800 12/05/17 1000 12/05/17 1007  BP: 125/73   125/77  Pulse:      Resp: 18  20   Temp:  98.1 F (36.7 C)    TempSrc:  Oral    SpO2: 99%  97%   Weight:      Height:        Intake/Output Summary (Last 24 hours) at 12/05/2017 1015 Last data filed at 12/05/2017 0601 Gross per 24 hour  Intake 3116.67 ml  Output 300 ml  Net 2816.67 ml   Filed Weights   12/02/17 1135  Weight: 72.6 kg (160 lb)    Examination:  General exam: No acute distress Respiratory system: No increased work of breathing, scattered rhonchi, no wheezes or crackles Cardiovascular system: Distant heart sounds, regular rate and rhythm, no murmurs Gastrointestinal system: Abdomen is nondistended,  soft and nontender. No organomegaly or masses felt. Normal bowel sounds heard. Central nervous system: Alert and oriented.  Bilateral flexor strength in lower extremities is 3 out of 5, able to plantar flex but weak dorsiflexion, diminished sensation in lower extremities Extremities: Trace lower extremity edema Skin: IV drug sites Psychiatry: Judgement and insight appear normal. Mood &  affect appropriate.     Data Reviewed: I have personally reviewed following labs and imaging studies  CBC: Recent Labs  Lab 12/02/17 1320 12/03/17 0327 12/04/17 0538 12/05/17 0324  WBC 28.3* 21.9* 14.9* 11.1*  HGB 9.5* 9.6* 8.7* 7.2*  HCT 28.7* 29.2* 27.1* 23.1*  MCV 87.8 88.0 90.3 92.0  PLT 402* 358 444* 341*   Basic Metabolic Panel: Recent Labs  Lab 12/02/17 1320 12/03/17 0327 12/04/17 0538 12/05/17 0324  NA 130* 133* 136 138  K 3.6 4.2 4.0 4.1  CL 90* 98 101 106  CO2 27 27 26 24   GLUCOSE 113* 109* 135* 100*  BUN 23* 17 12 11   CREATININE 1.04* 0.88 0.68 0.57  CALCIUM 8.9 8.5* 8.9 8.7*   GFR: Estimated Creatinine Clearance: 97.3 mL/min (by C-G formula based on SCr of 0.57 mg/dL). Liver Function Tests: Recent Labs  Lab 12/02/17 1320 12/03/17 0327  AST 13* 14*  ALT 6 6  ALKPHOS 168* 211*  BILITOT 1.0 0.8  PROT 7.2 6.6  ALBUMIN 2.4* 2.0*   No results for input(s): LIPASE, AMYLASE in the last 168 hours. No results for input(s): AMMONIA in the last 168 hours. Coagulation Profile: No results for input(s): INR, PROTIME in the last 168 hours. Cardiac Enzymes: No results for input(s): CKTOTAL, CKMB, CKMBINDEX, TROPONINI in the last 168 hours. BNP (last 3 results) No results for input(s): PROBNP in the last 8760 hours. HbA1C: No results for input(s): HGBA1C in the last 72 hours. CBG: No results for input(s): GLUCAP in the last 168 hours. Lipid Profile: No results for input(s): CHOL, HDL, LDLCALC, TRIG, CHOLHDL, LDLDIRECT in the last 72 hours. Thyroid Function Tests: No results for input(s): TSH, T4TOTAL, FREET4, T3FREE, THYROIDAB in the last 72 hours. Anemia Panel: No results for input(s): VITAMINB12, FOLATE, FERRITIN, TIBC, IRON, RETICCTPCT in the last 72 hours. Sepsis Labs: Recent Labs  Lab 12/02/17 1503  LATICACIDVEN 0.97    Recent Results (from the past 240 hour(s))  Blood culture (routine x 2)     Status: Abnormal   Collection Time: 12/02/17   2:55 PM  Result Value Ref Range Status   Specimen Description   Final    BLOOD LEFT FOOT Performed at [facility], [place] [address] [place], [place] [address]    Special Requests   Final    BOTTLES DRAWN AEROBIC AND ANAEROBIC Blood Culture results may not be optimal due to an excessive volume of blood received in culture bottles Performed at [facility] [address], [place], [place] [address]    Culture  Setup Time   Final    IN BOTH AEROBIC AND ANAEROBIC BOTTLES GRAM POSITIVE COCCI CRITICAL RESULT CALLED TO, READ BACK BY AND VERIFIED WITH: B GREEN PHARMD 12/03/17 0532 JDW Performed at [facility], [place] [address] [place], [place] [address]    Culture STAPHYLOCOCCUS AUREUS (A)  Final   Report Status 12/05/2017 FINAL  Final   Organism ID, Bacteria STAPHYLOCOCCUS AUREUS  Final      Susceptibility   Staphylococcus aureus - MIC*    CIPROFLOXACIN <=0.5 SENSITIVE Sensitive     ERYTHROMYCIN >=8 RESISTANT  Resistant     GENTAMICIN <=0.5 SENSITIVE Sensitive     OXACILLIN <=0.25 SENSITIVE Sensitive     TETRACYCLINE 8 INTERMEDIATE Intermediate     VANCOMYCIN <=0.5 SENSITIVE Sensitive     TRIMETH/SULFA <=10 SENSITIVE Sensitive     CLINDAMYCIN <=0.25 SENSITIVE Sensitive     RIFAMPIN <=0.5 SENSITIVE Sensitive     Inducible Clindamycin NEGATIVE Sensitive     * STAPHYLOCOCCUS AUREUS  Blood Culture ID Panel (Reflexed)     Status: Abnormal   Collection Time: 12/02/17  2:55 PM  Result Value Ref Range Status   Enterococcus species NOT DETECTED NOT DETECTED Final   Listeria monocytogenes NOT DETECTED NOT DETECTED Final   Staphylococcus species DETECTED (A) NOT DETECTED Final    Comment: CRITICAL RESULT CALLED TO, READ BACK BY AND VERIFIED WITH: B GREEN PHARMD 12/03/17 0532 JDW    Staphylococcus aureus DETECTED (A) NOT DETECTED Final    Comment: Methicillin (oxacillin) susceptible Staphylococcus aureus (MSSA). Preferred therapy is anti staphylococcal  beta lactam antibiotic (Cefazolin or Nafcillin), unless clinically contraindicated. CRITICAL RESULT CALLED TO, READ BACK BY AND VERIFIED WITH: B GREEN PHARMD 12/03/17 0532 JDW    Methicillin resistance NOT DETECTED NOT DETECTED Final   Streptococcus species NOT DETECTED NOT DETECTED Final   Streptococcus agalactiae NOT DETECTED NOT DETECTED Final   Streptococcus pneumoniae NOT DETECTED NOT DETECTED Final   Streptococcus pyogenes NOT DETECTED NOT DETECTED Final   Acinetobacter baumannii NOT DETECTED NOT DETECTED Final   Enterobacteriaceae species NOT DETECTED NOT DETECTED Final   Enterobacter cloacae complex NOT DETECTED NOT DETECTED Final   Escherichia coli NOT DETECTED NOT DETECTED Final   Klebsiella oxytoca NOT DETECTED NOT DETECTED Final   Klebsiella pneumoniae NOT DETECTED NOT DETECTED Final   Proteus species NOT DETECTED NOT DETECTED Final   Serratia marcescens NOT DETECTED NOT DETECTED Final   Haemophilus influenzae NOT DETECTED NOT DETECTED Final   Neisseria meningitidis NOT DETECTED NOT DETECTED Final   Pseudomonas aeruginosa NOT DETECTED NOT DETECTED Final   Candida albicans NOT DETECTED NOT DETECTED Final   Candida glabrata NOT DETECTED NOT DETECTED Final   Candida krusei NOT DETECTED NOT DETECTED Final   Candida parapsilosis NOT DETECTED NOT DETECTED Final   Candida tropicalis NOT DETECTED NOT DETECTED Final    Comment: Performed at [facility], [place]. [address] [place], [place] [address]  Blood culture (routine x 2)     Status: Abnormal   Collection Time: 12/02/17  3:57 PM  Result Value Ref Range Status   Specimen Description   Final    BLOOD RIGHT ARM Performed at [facility] [address] [place], [place] [address]    Special Requests   Final    BOTTLES DRAWN AEROBIC AND ANAEROBIC Blood Culture adequate volume Performed at [facility] [address], [place], [place] [address]    Culture  Setup Time   Final    IN  BOTH AEROBIC AND ANAEROBIC BOTTLES GRAM POSITIVE COCCI CRITICAL VALUE NOTED.  VALUE IS CONSISTENT WITH PREVIOUSLY REPORTED AND CALLED VALUE.    Culture (A)  Final    STAPHYLOCOCCUS AUREUS SUSCEPTIBILITIES PERFORMED ON PREVIOUS CULTURE WITHIN THE LAST 5 DAYS. Performed at [facility], [place] [address], [place], [place] [address]    Report Status 12/05/2017 FINAL  Final  MRSA PCR Screening     Status: None   Collection Time: 12/02/17  6:20 PM  Result Value Ref Range Status   MRSA by PCR NEGATIVE NEGATIVE  Final    Comment:        The GeneXpert MRSA Assay (FDA approved for NASAL specimens only), is one component of a comprehensive MRSA colonization surveillance program. It is not intended to diagnose MRSA infection nor to guide or monitor treatment for MRSA infections. Performed at [facility], [place] [address] [place], [place] [address]          Radiology Studies: No results found.      Scheduled Meds: . cloNIDine  0.1 mg Oral BH-qamhs   Followed by  . [START ON 12/07/2017] cloNIDine  0.1 mg Oral QAC breakfast  . docusate sodium  100 mg Oral BID  . guaiFENesin  1,200 mg Oral BID  . heparin  5,000 Units Subcutaneous Q8H  . lactobacillus  1 g Oral TID WC  . mouth rinse  15 mL Mouth Rinse BID  . methadone  40 mg Oral Daily  . nicotine  21 mg Transdermal Daily  . sodium chloride flush  3 mL Intravenous Q12H  . sodium chloride flush  3 mL Intravenous Q12H   Continuous Infusions: . sodium chloride    . sodium chloride 100 mL/hr at 12/05/17 0300  .  ceFAZolin (ANCEF) IV 2 g (12/05/17 0601)     LOS: 3 days    Time spent: 44    Cristy Folks, MD [facility]  If 7PM-7AM, please contact night-coverage [email or web address] Password [facility] 12/05/2017, 10:15 AM

## 2017-12-05 NOTE — Progress Notes (Addendum)
No pregnancy hcg test needed before MRI per Dr. Ambrose Pancoast

## 2017-12-05 NOTE — Consult Note (Signed)
Reason for Consult: Lumbar epidural abscess, lumbago, dense paraparesis Referring Physician: Dr. [name] is an 32 y.o. female.  HPI: The patient is a 32 year old white female with a history of sepsis, intravenous drug abuse and remote history of Harrington rod placement for scoliosis at [facility].  The patient tells me her problems began about 2 weeks ago when she again having severe back pain.  She says "I could not walk".  She was seen in the ER.  MRIs were obtained and she was treated and released.  The patient returned to the ER on 12/02/2017 fevers and blood cultures.  She was admitted with sepsis.  She continued to complain of severe back pain and leg weakness.  Several attempts were made at getting a repeat spine MRI.  Attempts were unsuccessful because the patient would not lay still despite sedation.  She got a MRI under general anesthesia today at [facility] which demonstrated a lumbar epidural abscess.  A neurosurgical consultation was requested.  I immediately saw the patient in the PACU.  She complains of severe back pain, leg weakness and numbness.  He admits she continues with intravenous drug abuse last shooting drugs on 12/02/2017.  I advised her to quit.  Past Medical History:  Diagnosis Date  . Abscess of breast    rt breast  . ADD (attention deficit disorder)   . Back pain   . Bipolar 1 disorder ([place])   . Hypertension   . Polysubstance abuse ([place])   . Suicidal overdose [facility])     Past Surgical History:  Procedure Laterality Date  . BACK SURGERY     scoliosis-in care everywhere 08/1999  . CESAREAN SECTION    . CHOLECYSTECTOMY  March 2015  . FOREIGN BODY REMOVAL Right 05/28/2016   Procedure: REMOVAL FOREIGN BODY RIGHT ARM;  Surgeon: Meredith Pel, MD;  Location: [place];  Service: Orthopedics;  Laterality: Right;  . IRRIGATION AND DEBRIDEMENT ABSCESS Right 07/13/2014   Procedure: IRRIGATION AND DEBRIDEMEN TRIGHT BREAST ABSCESS;  Surgeon: Donnie Mesa, MD;  Location: [place];  Service: General;  Laterality: Right;    Family History  Problem Relation Age of Onset  . Diabetes Mother   . Diabetes Father   . Hypertension Father   . Heart attack Neg Hx   . Hyperlipidemia Neg Hx   . Sudden death Neg Hx     Social History:  reports that she has been smoking cigarettes.  She has a 13.00 pack-year smoking history. She has never used smokeless tobacco. She reports that she drinks alcohol. She reports that she has current or past drug history. Drugs: Cocaine, Heroin, and IV.  Allergies:  Allergies  Allergen Reactions  . Hydrocodone Itching  . Morphine And Related Nausea And Vomiting    Medications:  I have reviewed the patient's current medications. Prior to Admission:  Medications Prior to Admission  Medication Sig Dispense Refill Last Dose  . methadone (DOLOPHINE) 10 MG/5ML solution Take 100 mg by mouth daily.   Past Week at Unknown time  . buPROPion (WELLBUTRIN XL) 150 MG 24 hr tablet Take 1 tablet (150 mg total) by mouth daily. (Patient not taking: Reported on 04/16/2017) 30 tablet 0 Not Taking at Unknown time  . gabapentin (NEURONTIN) 300 MG capsule Take 1 capsule (300 mg total) by mouth 3 (three) times daily. (Patient not taking: Reported on 04/16/2017) 90 capsule 0 Not Taking at Unknown time  . hydrOXYzine (ATARAX/VISTARIL) 50 MG tablet Take 1 tablet (50  mg total) by mouth every 6 (six) hours as needed for anxiety. (Patient not taking: Reported on 04/16/2017) 30 tablet 0 Not Taking at Unknown time  . ketorolac (TORADOL) 10 MG tablet Take 1 tablet (10 mg total) by mouth every 6 (six) hours as needed. (Patient not taking: Reported on 12/02/2017) 20 tablet 0 Not Taking at Unknown time  . nicotine (NICODERM CQ - DOSED IN MG/24 HOURS) 21 mg/24hr patch Place 1 patch (21 mg total) onto the skin daily. (Patient not taking: Reported on 04/16/2017) 28 patch 0 Not Taking at Unknown time  . phenazopyridine (PYRIDIUM) 200 MG tablet Take 1 tablet  (200 mg total) by mouth 3 (three) times daily. (Patient not taking: Reported on 12/02/2017) 6 tablet 0 Not Taking at Unknown time  . traZODone (DESYREL) 50 MG tablet Take 1 tablet (50 mg total) by mouth at bedtime as needed for sleep. (Patient not taking: Reported on 04/16/2017) 30 tablet 0 Not Taking at Unknown time   Scheduled: . [MAR Hold] cloNIDine  0.1 mg Oral BH-qamhs   Followed by  . [MAR Hold] cloNIDine  0.1 mg Oral QAC breakfast  . [MAR Hold] docusate sodium  100 mg Oral BID  . fentaNYL      . fentaNYL      . [MAR Hold] guaiFENesin  1,200 mg Oral BID  . [MAR Hold] heparin  5,000 Units Subcutaneous Q8H  . [MAR Hold] lactobacillus  1 g Oral TID WC  . [MAR Hold] mouth rinse  15 mL Mouth Rinse BID  . [MAR Hold] methadone  40 mg Oral Daily  . [MAR Hold] nicotine  21 mg Transdermal Daily  . promethazine      . [MAR Hold] sodium chloride flush  3 mL Intravenous Q12H  . [MAR Hold] sodium chloride flush  3 mL Intravenous Q12H  . sodium citrate-citric acid  30 mL Oral Once   Continuous: . [MAR Hold] sodium chloride    . sodium chloride 100 mL/hr at 12/05/17 0800  . [MAR Hold]  ceFAZolin (ANCEF) IV 2 g (12/05/17 0601)  . lactated ringers    . lactated ringers     PRN:[MAR Hold] sodium chloride, [MAR Hold] acetaminophen **OR** [MAR Hold] acetaminophen, [MAR Hold] dicyclomine, fentaNYL (SUBLIMAZE) injection, [MAR Hold]  HYDROmorphone (DILAUDID) injection, [MAR Hold] hydrOXYzine, [MAR Hold] ketorolac, [MAR Hold] lip balm, [MAR Hold] loperamide, [MAR Hold] methocarbamol, [MAR Hold] naproxen, [MAR Hold] ondansetron, [MAR Hold] oxyCODONE, promethazine, [MAR Hold] sodium chloride flush, [MAR Hold] traZODone Anti-infectives (From admission, onward)   Start     Dose/Rate Route Frequency Ordered Stop   12/03/17 1600  azithromycin (ZITHROMAX) 500 mg in sodium chloride 0.9 % 250 mL IVPB  Status:  Discontinued     500 mg 250 mL/hr over 60 Minutes Intravenous Every 24 hours 12/02/17 1706 12/03/17 0948    12/03/17 1400  [MAR Hold]  ceFAZolin (ANCEF) IVPB 2g/100 mL premix     (MAR Hold since Thu 12/05/2017 at 1051. Reason: Transfer to a Procedural area.)   2 g 200 mL/hr over 30 Minutes Intravenous Every 8 hours 12/03/17 0948     12/03/17 1000  vancomycin (VANCOCIN) IVPB 750 mg/150 ml premix  Status:  Discontinued     750 mg 150 mL/hr over 60 Minutes Intravenous Every 12 hours 12/02/17 1710 12/03/17 0948   12/02/17 2200  piperacillin-tazobactam (ZOSYN) IVPB 3.375 g  Status:  Discontinued     3.375 g 12.5 mL/hr over 240 Minutes Intravenous Every 8 hours 12/02/17 1710 12/03/17 0948  12/02/17 1800  vancomycin (VANCOCIN) 1,500 mg in sodium chloride 0.9 % 500 mL IVPB     1,500 mg 250 mL/hr over 120 Minutes Intravenous  Once 12/02/17 1710 12/02/17 2110   12/02/17 1530  cefTRIAXone (ROCEPHIN) 1 g in sodium chloride 0.9 % 100 mL IVPB     1 g 200 mL/hr over 30 Minutes Intravenous  Once 12/02/17 1522 12/02/17 1626   12/02/17 1530  azithromycin (ZITHROMAX) 500 mg in sodium chloride 0.9 % 250 mL IVPB     500 mg 250 mL/hr over 60 Minutes Intravenous  Once 12/02/17 1522 12/02/17 1759       Results for orders placed or performed during the hospital encounter of 12/02/17 (from the past 48 hour(s))  HIV antibody     Status: None   Collection Time: 12/04/17  5:38 AM  Result Value Ref Range   HIV Screen 4th Generation wRfx Non Reactive Non Reactive    Comment: (NOTE) Performed At: [facility] [place], [place] [id number] Rush Farmer MD [id number]   Hepatitis A antibody, total     Status: None   Collection Time: 12/04/17  5:38 AM  Result Value Ref Range   Hep A Total Ab Negative Negative    Comment: (NOTE) Performed At: [name] [place], [place] [id number] Rush Farmer MD [id number]   Hepatitis B surface antibody     Status: None   Collection Time: 12/04/17  5:38 AM  Result Value Ref Range   Hep B S Ab Non Reactive     Comment:  (NOTE)              Non Reactive: Inconsistent with immunity,                            less than 10 mIU/mL              Reactive:     Consistent with immunity,                            greater than 9.9 mIU/mL Performed At: [facility] [place], [place] [id number] Rush Farmer MD [id number]   Hepatitis B surface antigen     Status: None   Collection Time: 12/04/17  5:38 AM  Result Value Ref Range   Hepatitis B Surface Ag Negative Negative    Comment: (NOTE) Performed At: [facility] [place], [place] [id number] Rush Farmer MD [id number]   RPR     Status: None   Collection Time: 12/04/17  5:38 AM  Result Value Ref Range   RPR Ser Ql Non Reactive Non Reactive    Comment: (NOTE) Performed At: [facility] [place], [place] [id number] Rush Farmer MD [id number]   CBC     Status: Abnormal   Collection Time: 12/04/17  5:38 AM  Result Value Ref Range   WBC 14.9 (H) 4.0 - 10.5 K/uL   RBC 3.00 (L) 3.87 - 5.11 MIL/uL   Hemoglobin 8.7 (L) 12.0 - 15.0 g/dL   HCT 27.1 (L) 36.0 - 46.0 %   MCV 90.3 78.0 - 100.0 fL   MCH 29.0 26.0 - 34.0 pg   MCHC 32.1 30.0 - 36.0 g/dL   RDW 14.3 11.5 - 15.5 %   Platelets 444 (H) 150 - 400 K/uL    Comment: Performed at [facility], [place] [address] [place], [place] [address]  Basic metabolic panel     Status: Abnormal   Collection Time: 12/04/17  5:38 AM  Result Value Ref Range   Sodium 136 135 - 145 mmol/L   Potassium 4.0 3.5 - 5.1 mmol/L   Chloride 101 98 - 111 mmol/L   CO2 26 22 - 32 mmol/L   Glucose, Bld 135 (H) 70 - 99 mg/dL   BUN 12 6 - 20 mg/dL   Creatinine, Ser 0.68 0.44 - 1.00 mg/dL   Calcium 8.9 8.9 - 10.3 mg/dL   GFR calc non Af Amer >60 >60 mL/min   GFR calc Af Amer >60 >60 mL/min    Comment: (NOTE) The eGFR has been calculated using the CKD EPI equation. This calculation has not been validated in all clinical  situations. eGFR's persistently <60 mL/min signify possible Chronic Kidney Disease.    Anion gap 9 5 - 15    Comment: Performed at [facility], [place] [address] [place], [place] [address]  Culture, blood (routine x 2)     Status: None (Preliminary result)   Collection Time: 12/04/17  8:03 AM  Result Value Ref Range   Specimen Description      BLOOD LEFT ANTECUBITAL Performed at [place] [address] [place], [place] [address]    Special Requests      BOTTLES DRAWN AEROBIC ONLY Blood Culture adequate volume Performed at [place] [address], [place], [place] [address]    Culture      NO GROWTH 1 DAY Performed at [place] [address], [place], [place] [address]    Report Status PENDING   Culture, blood (routine x 2)     Status: None (Preliminary result)   Collection Time: 12/04/17  8:13 AM  Result Value Ref Range   Specimen Description      BLOOD LEFT HAND Performed at [place] [address], [place], [place] [address]    Special Requests      BOTTLES DRAWN AEROBIC ONLY Blood Culture adequate volume Performed at [place] [address] [place], [place] [address]    Culture      NO GROWTH 1 DAY Performed at [place] [address], [place], [place] [address]    Report Status PENDING   CBC     Status: Abnormal   Collection Time: 12/05/17  3:24 AM  Result Value Ref Range   WBC 11.1 (H) 4.0 - 10.5 K/uL   RBC 2.51 (L) 3.87 - 5.11 MIL/uL   Hemoglobin 7.2 (L) 12.0 - 15.0 g/dL   HCT 23.1 (L) 36.0 - 46.0 %   MCV 92.0 78.0 - 100.0 fL   MCH 28.7 26.0 - 34.0 pg   MCHC 31.2 30.0 - 36.0 g/dL   RDW 14.7 11.5 - 15.5 %   Platelets 419 (H) 150 - 400 K/uL    Comment: Performed at [facility], [place] [address], [place],  [address]  Basic metabolic panel     Status: Abnormal   Collection Time: 12/05/17  3:24 AM   Result Value Ref Range   Sodium 138 135 - 145 mmol/L   Potassium 4.1 3.5 - 5.1 mmol/L   Chloride 106 98 - 111 mmol/L   CO2 24 22 - 32 mmol/L   Glucose, Bld 100 (H) 70 - 99 mg/dL   BUN 11 6 - 20 mg/dL  Creatinine, Ser 0.57 0.44 - 1.00 mg/dL   Calcium 8.7 (L) 8.9 - 10.3 mg/dL   GFR calc non Af Amer >60 >60 mL/min   GFR calc Af Amer >60 >60 mL/min    Comment: (NOTE) The eGFR has been calculated using the CKD EPI equation. This calculation has not been validated in all clinical situations. eGFR's persistently <60 mL/min signify possible Chronic Kidney Disease.    Anion gap 8 5 - 15    Comment: Performed at [facility], [place] [address], [place], [place] [address]  Pregnancy, urine     Status: None   Collection Time: 12/05/17  9:53 AM  Result Value Ref Range   Preg Test, Ur NEGATIVE NEGATIVE    Comment:        THE SENSITIVITY OF THIS METHODOLOGY IS >20 mIU/mL. Performed at [name], [place] [address], [place], [place] [address]     Mr Thoracic Spine W Wo Contrast  Result Date: 12/05/2017 CLINICAL DATA:  Fevers, chills, endocarditis resulting and pulmonary abscess. History of polysubstance abuse and scoliosis. EXAM: MRI THORACIC AND LUMBAR SPINE WITHOUT AND WITH CONTRAST TECHNIQUE: Multiplanar and multiecho pulse sequences of the thoracic and lumbar spine were obtained without and with intravenous contrast. CONTRAST:  59m MULTIHANCE GADOBENATE DIMEGLUMINE 529 MG/ML IV SOLN COMPARISON:  MRI of the thoracic and lumbar spine November 21, 2017. FINDINGS: MRI THORACIC SPINE FINDINGS-spinal hardware resulting in extensive susceptibility artifact. ALIGNMENT: Straightened of the thoracic kyphosis. No malalignment. Scoliosis on this nonweightbearing examination. VERTEBRAE/DISCS: T3 through lumbar Harrington rods with laminar hooks. Vertebral bodies are intact. Intervertebral discs morphology and signal are normal though limited assessment. No abnormal bone  marrow signal though limited STIR sequence. Vertebral bodies predominance cured on post gadolinium T1 sequence. CORD: Thoracic spinal cord is normal morphology and signal characteristics to the level of the conus medullaris which terminates at T12-L1. PREVERTEBRAL AND PARASPINAL SOFT TISSUES: Mild paraspinal muscle atrophy with no suspicious signal. Small RIGHT pleural effusion and multifocal consolidation. DISC LEVELS: No disc bulge, canal stenosis of upper thoracic spine to T3-4, below levels predominant obscured. MRI LUMBAR SPINE FINDINGS--spinal hardware resulting in extensive susceptibility artifact. SEGMENTATION: For the purposes of this report, the last well-formed intervertebral disc is reported as L5-S1. ALIGNMENT: Maintained lumbar lordosis. No malalignment. Thoracic through L3 Harrington rods with laminar hooks. VERTEBRAE:Vertebral bodies are intact. Moderate L4-5 and L5-S1 disc height loss with increasing disc edema, no enhancement. No suspicious vertebral body enhancement. Abnormal signal enhancement LEFT sacrum. CONUS MEDULLARIS AND CAUDA EQUINA: Conus medullaris obscured. Thecal sac effacement due to epidural collection with rim enhancement measuring to 17 x 9 mm (transverse by AP) at L4-5. Thecal sac is to 4 mm in AP dimension at L4-5. PARASPINAL AND OTHER SOFT TISSUES: Bright STIR signal in suspected enhancement paraspinal muscles lower lumbar spine. 2 cm probable fluid collection LEFT paraspinal muscles at L4-5. Abnormal signal enhancement L4-5 and possibly L5-S1 facets. DISC LEVELS: L1-2 and L2-3: Obscured. L3-4: Thecal sac effacement due to epidural collection. Severe facet arthropathy with abnormal enhancement. No osseous canal stenosis or neural foraminal narrowing. L4-5: Small broad-based disc bulge asymmetric to the LEFT. Thecal sac effacement due to epidural collection. Moderate facet arthropathy and ligamentum flavum redundancy. Mild neural foraminal narrowing. L5-S1: 6 x 7 mm RIGHT central  disc extrusion versus abscess displacing the traversing RIGHT S1 nerve. Annular fissure versus annular enhancement from infection. Moderate RIGHT and severe LEFT facet arthropathy without canal stenosis. Moderate RIGHT, moderate to severe LEFT neural foraminal  narrowing. IMPRESSION: MRI thoracic spine: 1. Scoliosis and posterior spinal fixation rods limiting assessment. 2. Stable appearance without imaging evidence of acute osseous process or infection in the spine. 3. Bilateral lung consolidation consistent with history of septic emboli. Small RIGHT pleural effusion. MRI lumbar spine: 1. Scoliosis and posterior spinal fixation rods limiting assessment. 2. New epidural abscess lower lumbar spine effacing thecal sac. 3. Suspected lower lumbar facet septic arthropathy. Probable paraspinal myositis and 2 cm abscess. 4. L5-S1 6 x 7 mm disc extrusion versus abscess. Equivocal L5-S1 discitis. 5. 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 These results will be called to the ordering clinician or representative by the Radiologist Assistant, and communication documented in the PACS or zVision Dashboard. Electronically Signed   By: Kelly [name] M.D.   On: 12/05/2017 17:18   Mr Lumbar Spine W Wo Contrast  Result Date: 12/05/2017 CLINICAL DATA:  Fevers, chills, endocarditis resulting and pulmonary abscess. History of polysubstance abuse and scoliosis. EXAM: MRI THORACIC AND LUMBAR SPINE WITHOUT AND WITH CONTRAST TECHNIQUE: Multiplanar and multiecho pulse sequences of the thoracic and lumbar spine were obtained without and with intravenous contrast. CONTRAST:  86m MULTIHANCE GADOBENATE DIMEGLUMINE 529 MG/ML IV SOLN COMPARISON:  MRI of the thoracic and lumbar spine November 21, 2017. FINDINGS: MRI THORACIC SPINE FINDINGS-spinal hardware resulting in extensive susceptibility artifact. ALIGNMENT: Straightened of the thoracic kyphosis. No malalignment. Scoliosis on this nonweightbearing examination. VERTEBRAE/DISCS:  T3 through lumbar Harrington rods with laminar hooks. Vertebral bodies are intact. Intervertebral discs morphology and signal are normal though limited assessment. No abnormal bone marrow signal though limited STIR sequence. Vertebral bodies predominance cured on post gadolinium T1 sequence. CORD: Thoracic spinal cord is normal morphology and signal characteristics to the level of the conus medullaris which terminates at T12-L1. PREVERTEBRAL AND PARASPINAL SOFT TISSUES: Mild paraspinal muscle atrophy with no suspicious signal. Small RIGHT pleural effusion and multifocal consolidation. DISC LEVELS: No disc bulge, canal stenosis of upper thoracic spine to T3-4, below levels predominant obscured. MRI LUMBAR SPINE FINDINGS--spinal hardware resulting in extensive susceptibility artifact. SEGMENTATION: For the purposes of this report, the last well-formed intervertebral disc is reported as L5-S1. ALIGNMENT: Maintained lumbar lordosis. No malalignment. Thoracic through L3 Harrington rods with laminar hooks. VERTEBRAE:Vertebral bodies are intact. Moderate L4-5 and L5-S1 disc height loss with increasing disc edema, no enhancement. No suspicious vertebral body enhancement. Abnormal signal enhancement LEFT sacrum. CONUS MEDULLARIS AND CAUDA EQUINA: Conus medullaris obscured. Thecal sac effacement due to epidural collection with rim enhancement measuring to 17 x 9 mm (transverse by AP) at L4-5. Thecal sac is to 4 mm in AP dimension at L4-5. PARASPINAL AND OTHER SOFT TISSUES: Bright STIR signal in suspected enhancement paraspinal muscles lower lumbar spine. 2 cm probable fluid collection LEFT paraspinal muscles at L4-5. Abnormal signal enhancement L4-5 and possibly L5-S1 facets. DISC LEVELS: L1-2 and L2-3: Obscured. L3-4: Thecal sac effacement due to epidural collection. Severe facet arthropathy with abnormal enhancement. No osseous canal stenosis or neural foraminal narrowing. L4-5: Small broad-based disc bulge asymmetric to  the LEFT. Thecal sac effacement due to epidural collection. Moderate facet arthropathy and ligamentum flavum redundancy. Mild neural foraminal narrowing. L5-S1: 6 x 7 mm RIGHT central disc extrusion versus abscess displacing the traversing RIGHT S1 nerve. Annular fissure versus annular enhancement from infection. Moderate RIGHT and severe LEFT facet arthropathy without canal stenosis. Moderate RIGHT, moderate to severe LEFT neural foraminal narrowing. IMPRESSION: MRI thoracic spine: 1. Scoliosis and posterior spinal fixation rods limiting assessment. 2. Stable appearance without imaging evidence of  acute osseous process or infection in the spine. 3. Bilateral lung consolidation consistent with history of septic emboli. Small RIGHT pleural effusion. MRI lumbar spine: 1. Scoliosis and posterior spinal fixation rods limiting assessment. 2. New epidural abscess lower lumbar spine effacing thecal sac. 3. Suspected lower lumbar facet septic arthropathy. Probable paraspinal myositis and 2 cm abscess. 4. L5-S1 6 x 7 mm disc extrusion versus abscess. Equivocal L5-S1 discitis. 5. 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 These results will be called to the ordering clinician or representative by the Radiologist Assistant, and communication documented in the PACS or zVision Dashboard. Electronically Signed   By: Kelly [name] M.D.   On: 12/05/2017 17:18    ROS: As above.  She denies neck pain, thoracic pain, headaches, etc.  She complains of pain in the lumbar region. Blood pressure (!) 123/92, pulse 83, temperature 98.5 F (36.9 C), resp. rate 20, height _0  (1.702 m), weight 77.1 kg (170 lb), last menstrual period 11/11/2017, SpO2 97 %, unknown if currently breastfeeding. Estimated body mass index is 26.63 kg/m as calculated from the following:   Height as of this encounter: _1  (1.702 m).   Weight as of this encounter: 77.1 kg (170 lb).  Physical Exam  General: A 32 year old densely  paraparetic white female in bed complaining of back and leg pain.  HEENT: Normocephalic, atraumatic her pupils are equal round react to light, extraocular muscle intact.  Neck: Supple with a normal range of motion no tenderness.  Thorax: Symmetric  Abdomen: Soft  Extremities: Unremarkable  Labs exam: The patient is alert and oriented x3.  Glasgow Coma Scale 15.  Cranial nerves II through XII are examined bilaterally and grossly normal.  Vision and hearing are grossly normal bilaterally.  Cerebellar function is intact to rapid altering movements in the upper extremities bilaterally.  The patient's motor strength is grossly normal in her bilateral bicep, tricep and handgrips although she gives a way to pain.  The patient is densely paraparetic.  She has approximately 1-2 over 5 bilateral quadricep, gastrocnemius and dorsiflexors strength.  She has decreased sensation in her lower extremities.  She denies perineal numbness.  Imaging studies I have reviewed the patient's thoracic and lumbar MRI performed at [facility] today.  There is quite a bit of metal fact because of the Harrington rods and hooks.  He has a new lumbar epidural abscess when compared with the prior study of 11/21/2017.  Assessment/Plan: Lumbar epidural abscess, osteomyelitis lumbago, dense paraparesis: I have discussed the situation with the patient and at the patient's request separately with her mother via the telephone.  We have discussed the various treatment options.  I have recommended surgery.  I have described a lumbar laminectomy for drainage of the epidural abscess.  We have discussed the risks of that surgery including risk of anesthesia, hemorrhage, superinfection, spinal fluid leak, nerve root injury, paralysis, medical risk, etc.  We have also discussed the alternative treatment option of continued IV antibiotics.  I have answered all their questions.  The patient has decided to proceed with surgery.  We will do  this emergently as the patient is quite weak for fear of further neurologic deficits if we wait.  Kelly [name] 12/05/2017, 6:32 PM

## 2017-12-05 NOTE — Progress Notes (Signed)
Pt's pain is progressively getting worse despite PRN pain medications given in a timely manner. Per Sharmon Leyden, RN's note, pt is still unable to feel herself voiding, left leg has no feeling is barely able to move. Right leg can move slightly and patient only feels tingling. Will continue to monitor.

## 2017-12-05 NOTE — Significant Event (Signed)
32 year old with past medical history relevant for IV drug use and tricuspid valve endocarditis with MSSA admitted to [facility] on 12/02/2017.  Yesterday patient noted to have severe back pain however was unable to tolerate nonsedated MRI x2 at [facility].  Today patient had sedated MRI at [facility] that showed spinal epidural abscess and effacement of thecal sac.  Neurosurgery Dr. Arnoldo Morale was consulted and recommended transfer to [facility].  Patient was in: PACU at this time and she was kept at [facility]  She was in stepdown in the ICU at [facility] and so will be kept in stepdown at [facility].

## 2017-12-05 NOTE — Transfer of Care (Signed)
Immediate Anesthesia Transfer of Care Note  Patient: Kelly [name]  Procedure(s) Performed: LUMBAR LAMINECTOMY FOR EPIDURAL ABSCESS (N/A Spine Lumbar)  Patient Location: PACU  Anesthesia Type:General  Level of Consciousness: awake and confused  Airway & Oxygen Therapy: Patient connected to nasal cannula oxygen  Post-op Assessment: Report given to RN and Post -op Vital signs reviewed and stable  Post vital signs: Reviewed and stable  Last Vitals:  Vitals Value Taken Time  BP 137/114 12/05/2017  9:22 PM  Temp    Pulse 95 12/05/2017  9:29 PM  Resp 32 12/05/2017  9:29 PM  SpO2 100 % 12/05/2017  9:29 PM  Vitals shown include unvalidated device data.  Last Pain:  Vitals:   12/05/17 1900  TempSrc:   PainSc: 10-Worst pain ever      Patients Stated Pain Goal: 3 (78/58/85 0277)  Complications: No apparent anesthesia complications

## 2017-12-05 NOTE — Progress Notes (Addendum)
Pharmacy Antibiotic Note  Kelly [name] is a 32 y.o. female admitted on 12/02/2017 with lumbar abscess.  Pharmacy has been consulted for vancomycin dosing. She was on ancef for MSSA bacteremia.  I tried to contact Dr Arnoldo Morale via text page but text did not go through  Plan: Vancomycin 1750 mg IV x 1 then 1gm IV q8 hours F/u renal function, cultures and clinical course F/u antibiotics with MD in am  Height: 5' 7"  (170.2 cm) Weight: 198 lb 3.1 oz (89.9 kg) IBW/kg (Calculated) : 61.6  Temp (24hrs), Avg:98.2 F (36.8 C), Min:97.5 F (36.4 C), Max:99 F (37.2 C)  Recent Labs  Lab 12/02/17 1320 12/02/17 1503 12/03/17 0327 12/04/17 0538 12/05/17 0324  WBC 28.3*  --  21.9* 14.9* 11.1*  CREATININE 1.04*  --  0.88 0.68 0.57  LATICACIDVEN  --  0.97  --   --   --     Estimated Creatinine Clearance: 115.1 mL/min (by C-G formula based on SCr of 0.57 mg/dL).    Allergies  Allergen Reactions  . Hydrocodone Itching  . Morphine And Related Nausea And Vomiting     Thank you for allowing pharmacy to be a part of this patient's care.  Excell Seltzer Poteet 12/05/2017 11:12 PM

## 2017-12-05 NOTE — Anesthesia Postprocedure Evaluation (Signed)
Anesthesia Post Note  Patient: Kelly [name]  Procedure(s) Performed: MRI WITH ANESTHESIA OF THORCIC AND LUMBAR SPINE WITH AND WITHOUT (N/A )     Patient location during evaluation: PACU Anesthesia Type: General Level of consciousness: awake and alert Pain management: pain level controlled Vital Signs Assessment: post-procedure vital signs reviewed and stable Respiratory status: spontaneous breathing, nonlabored ventilation, respiratory function stable and patient connected to nasal cannula oxygen Cardiovascular status: blood pressure returned to baseline and stable Postop Assessment: no apparent nausea or vomiting Anesthetic complications: no    Last Vitals:  Vitals:   12/05/17 1550 12/05/17 1605  BP: 101/60 (!) 108/59  Pulse: 100 100  Resp: (!) 21 (!) 22  Temp: (!) 36.4 C   SpO2: 94% 93%    Last Pain:  Vitals:   12/05/17 1550  TempSrc:   PainSc: [place] Ree [place]cal[place]

## 2017-12-06 ENCOUNTER — Encounter (HOSPITAL_COMMUNITY): Payer: Self-pay | Admitting: Radiology

## 2017-12-06 DIAGNOSIS — I071 Rheumatic tricuspid insufficiency: Secondary | ICD-10-CM

## 2017-12-06 DIAGNOSIS — I079 Rheumatic tricuspid valve disease, unspecified: Secondary | ICD-10-CM

## 2017-12-06 DIAGNOSIS — M4640 Discitis, unspecified, site unspecified: Secondary | ICD-10-CM

## 2017-12-06 DIAGNOSIS — G061 Intraspinal abscess and granuloma: Secondary | ICD-10-CM

## 2017-12-06 DIAGNOSIS — I368 Other nonrheumatic tricuspid valve disorders: Secondary | ICD-10-CM

## 2017-12-06 LAB — BASIC METABOLIC PANEL
ANION GAP: 10 (ref 5–15)
BUN: 6 mg/dL (ref 6–20)
CALCIUM: 8.2 mg/dL — AB (ref 8.9–10.3)
CO2: 22 mmol/L (ref 22–32)
CREATININE: 0.6 mg/dL (ref 0.44–1.00)
Chloride: 103 mmol/L (ref 98–111)
GFR calc Af Amer: >60 mL/min (ref >60)
GLUCOSE: 108 mg/dL — AB (ref 70–99)
Potassium: 4.5 mmol/L (ref 3.5–5.1)
Sodium: 135 mmol/L (ref 135–145)

## 2017-12-06 LAB — MAGNESIUM: Magnesium: 1.3 mg/dL — ABNORMAL LOW (ref 1.7–2.4)

## 2017-12-06 LAB — CBC
HCT: 23.8 % — ABNORMAL LOW (ref 36.0–46.0)
Hemoglobin: 7.2 g/dL — ABNORMAL LOW (ref 12.0–15.0)
MCH: 27.8 pg (ref 26.0–34.0)
MCHC: 30.3 g/dL (ref 30.0–36.0)
MCV: 91.9 fL (ref 78.0–100.0)
Platelets: 414 10*3/uL — ABNORMAL HIGH (ref 150–400)
RBC: 2.59 MIL/uL — ABNORMAL LOW (ref 3.87–5.11)
RDW: 14 % (ref 11.5–15.5)
WBC: 11.8 10*3/uL — AB (ref 4.0–10.5)

## 2017-12-06 LAB — HEPATITIS C VRS RNA DETECT BY PCR-QUAL: Hepatitis C Vrs RNA by PCR-Qual: NEGATIVE

## 2017-12-06 MED ORDER — HYDROMORPHONE HCL 1 MG/ML IJ SOLN
2.0000 mg | INTRAMUSCULAR | Status: DC | PRN
  Administered 2017-12-06 – 2017-12-11 (×34): 2 mg via INTRAVENOUS
  Filled 2017-12-06 (×34): qty 2

## 2017-12-06 MED ORDER — MAGNESIUM SULFATE 2 GM/50ML IV SOLN
2.0000 g | Freq: Once | INTRAVENOUS | Status: AC
  Administered 2017-12-06: 2 g via INTRAVENOUS
  Filled 2017-12-06: qty 50

## 2017-12-06 MED ORDER — CEFAZOLIN SODIUM-DEXTROSE 2-4 GM/100ML-% IV SOLN
2.0000 g | Freq: Three times a day (TID) | INTRAVENOUS | Status: AC
  Administered 2017-12-06 – 2018-01-29 (×163): 2 g via INTRAVENOUS
  Filled 2017-12-06 (×166): qty 100

## 2017-12-06 MED FILL — Thrombin (Recombinant) For Soln 20000 Unit: CUTANEOUS | Qty: 1 | Status: AC

## 2017-12-06 NOTE — Progress Notes (Signed)
[facility] for Infectious Disease   Reason for visit: Follow up on MSSA bacteremia, TV endocarditis, discitis with epidural abscess  Interval History: transferred to [facility] for surgery with Dr. Arnoldo Morale.  WBC 11.8, afebrile.  Mother at bedside.   No associated rash or diarrhea.   Physical Exam: Constitutional:  Vitals:   12/06/17 1000 12/06/17 1200  BP: (!) 135/94 129/89  Pulse: 90 86  Resp: (!) 29 (!) 23  Temp:  98.6 F (37 C)  SpO2: 94% 94%   patient appears in NAD, sleeping, arousable Respiratory: Normal respiratory effort; CTA B Cardiovascular: RRR GI: soft, nt, nd Skin: no rashes  Review of Systems: Constitutional: negative for fevers and chills Gastrointestinal: negative for nausea and diarrhea  Lab Results  Component Value Date   WBC 11.8 (H) 12/06/2017   HGB 7.2 (L) 12/06/2017   HCT 23.8 (L) 12/06/2017   MCV 91.9 12/06/2017   PLT 414 (H) 12/06/2017    Lab Results  Component Value Date   CREATININE 0.60 12/06/2017   BUN 6 12/06/2017   NA 135 12/06/2017   K 4.5 12/06/2017   CL 103 12/06/2017   CO2 22 12/06/2017    Lab Results  Component Value Date   ALT 6 12/03/2017   AST 14 (L) 12/03/2017   ALKPHOS 211 (H) 12/03/2017     Microbiology: Recent Results (from the past 240 hour(s))  Blood culture (routine x 2)     Status: Abnormal   Collection Time: 12/02/17  2:55 PM  Result Value Ref Range Status   Specimen Description   Final    BLOOD LEFT FOOT Performed at [facility], [place] [address] [place], [place] [address]    Special Requests   Final    BOTTLES DRAWN AEROBIC AND ANAEROBIC Blood Culture results may not be optimal due to an excessive volume of blood received in culture bottles Performed at [facility] [address] [place], [place] [address]    Culture  Setup Time   Final    IN BOTH AEROBIC AND ANAEROBIC BOTTLES GRAM POSITIVE COCCI CRITICAL RESULT CALLED TO, READ BACK BY AND VERIFIED WITH: B  GREEN PHARMD 12/03/17 0532 JDW Performed at [facility], [place] [address], [place], [place] [address]    Culture STAPHYLOCOCCUS AUREUS (A)  Final   Report Status 12/05/2017 FINAL  Final   Organism ID, Bacteria STAPHYLOCOCCUS AUREUS  Final      Susceptibility   Staphylococcus aureus - MIC*    CIPROFLOXACIN <=0.5 SENSITIVE Sensitive     ERYTHROMYCIN >=8 RESISTANT Resistant     GENTAMICIN <=0.5 SENSITIVE Sensitive     OXACILLIN <=0.25 SENSITIVE Sensitive     TETRACYCLINE 8 INTERMEDIATE Intermediate     VANCOMYCIN <=0.5 SENSITIVE Sensitive     TRIMETH/SULFA <=10 SENSITIVE Sensitive     CLINDAMYCIN <=0.25 SENSITIVE Sensitive     RIFAMPIN <=0.5 SENSITIVE Sensitive     Inducible Clindamycin NEGATIVE Sensitive     * STAPHYLOCOCCUS AUREUS  Blood Culture ID Panel (Reflexed)     Status: Abnormal   Collection Time: 12/02/17  2:55 PM  Result Value Ref Range Status   Enterococcus species NOT DETECTED NOT DETECTED Final   Listeria monocytogenes NOT DETECTED NOT DETECTED Final   Staphylococcus species DETECTED (A) NOT DETECTED Final    Comment: CRITICAL RESULT CALLED TO, READ BACK BY AND VERIFIED WITH: B GREEN PHARMD 12/03/17 0532 JDW    Staphylococcus aureus DETECTED (A) NOT DETECTED Final  Comment: Methicillin (oxacillin) susceptible Staphylococcus aureus (MSSA). Preferred therapy is anti staphylococcal beta lactam antibiotic (Cefazolin or Nafcillin), unless clinically contraindicated. CRITICAL RESULT CALLED TO, READ BACK BY AND VERIFIED WITH: B GREEN PHARMD 12/03/17 0532 JDW    Methicillin resistance NOT DETECTED NOT DETECTED Final   Streptococcus species NOT DETECTED NOT DETECTED Final   Streptococcus agalactiae NOT DETECTED NOT DETECTED Final   Streptococcus pneumoniae NOT DETECTED NOT DETECTED Final   Streptococcus pyogenes NOT DETECTED NOT DETECTED Final   Acinetobacter baumannii NOT DETECTED NOT DETECTED Final   Enterobacteriaceae species NOT DETECTED NOT DETECTED Final    Enterobacter cloacae complex NOT DETECTED NOT DETECTED Final   Escherichia coli NOT DETECTED NOT DETECTED Final   Klebsiella oxytoca NOT DETECTED NOT DETECTED Final   Klebsiella pneumoniae NOT DETECTED NOT DETECTED Final   Proteus species NOT DETECTED NOT DETECTED Final   Serratia marcescens NOT DETECTED NOT DETECTED Final   Haemophilus influenzae NOT DETECTED NOT DETECTED Final   Neisseria meningitidis NOT DETECTED NOT DETECTED Final   Pseudomonas aeruginosa NOT DETECTED NOT DETECTED Final   Candida albicans NOT DETECTED NOT DETECTED Final   Candida glabrata NOT DETECTED NOT DETECTED Final   Candida krusei NOT DETECTED NOT DETECTED Final   Candida parapsilosis NOT DETECTED NOT DETECTED Final   Candida tropicalis NOT DETECTED NOT DETECTED Final    Comment: Performed at [facility], [place]. [address], [place], [place] [address]  Blood culture (routine x 2)     Status: Abnormal   Collection Time: 12/02/17  3:57 PM  Result Value Ref Range Status   Specimen Description   Final    BLOOD RIGHT ARM Performed at [facility] [address] [place], [place] [address]    Special Requests   Final    BOTTLES DRAWN AEROBIC AND ANAEROBIC Blood Culture adequate volume Performed at [facility] [address], [place], [place] [address]    Culture  Setup Time   Final    IN BOTH AEROBIC AND ANAEROBIC BOTTLES GRAM POSITIVE COCCI CRITICAL VALUE NOTED.  VALUE IS CONSISTENT WITH PREVIOUSLY REPORTED AND CALLED VALUE.    Culture (A)  Final    STAPHYLOCOCCUS AUREUS SUSCEPTIBILITIES PERFORMED ON PREVIOUS CULTURE WITHIN THE LAST 5 DAYS. Performed at [name], [place] [address] [place], [place] [address]    Report Status 12/05/2017 FINAL  Final  MRSA PCR Screening     Status: None   Collection Time: 12/02/17  6:20 PM  Result Value Ref Range Status   MRSA by PCR NEGATIVE NEGATIVE Final    Comment:        The GeneXpert MRSA Assay (FDA approved for  NASAL specimens only), is one component of a comprehensive MRSA colonization surveillance program. It is not intended to diagnose MRSA infection nor to guide or monitor treatment for MRSA infections. Performed at [facility], [place] [address] [place], [place] [address]   Culture, blood (routine x 2)     Status: None (Preliminary result)   Collection Time: 12/04/17  8:03 AM  Result Value Ref Range Status   Specimen Description   Final    BLOOD LEFT ANTECUBITAL Performed at [facility] [address] [place], [place] [address]    Special Requests   Final    BOTTLES DRAWN AEROBIC ONLY Blood Culture adequate volume Performed at [facility] [address], [place],  [address]    Culture   Final  NO GROWTH 2 DAYS Performed at [facility], [place] [address] [place], [place] [address]    Report Status PENDING  Incomplete  Culture, blood (routine x 2)     Status: None (Preliminary result)   Collection Time: 12/04/17  8:13 AM  Result Value Ref Range Status   Specimen Description   Final    BLOOD LEFT HAND Performed at [facility] [address], [place], [place] [address]    Special Requests   Final    BOTTLES DRAWN AEROBIC ONLY Blood Culture adequate volume Performed at [facility] [address] [place], [place] [address]    Culture   Final    NO GROWTH 2 DAYS Performed at [facility] [address], [place], [place] [address]    Report Status PENDING  Incomplete  Aerobic/Anaerobic Culture (surgical/deep wound)     Status: None (Preliminary result)   Collection Time: 12/05/17  8:03 PM  Result Value Ref Range Status   Specimen Description ABSCESS  Final   Special Requests LUMBAR EPIDURAL  Final   Gram Stain   Final    ABUNDANT WBC PRESENT, PREDOMINANTLY PMN MODERATE GRAM POSITIVE COCCI    Culture   Final    CULTURE REINCUBATED FOR BETTER  GROWTH Performed at [facility], [place] [address], [place],  [address]    Report Status PENDING  Incomplete    Impression/Plan:  1. MSSA bacteremia - on cefazolin and will continue.  2. Epidural abscess - s/p surgery.  Gram stain with [facility] c/w patients known MSSA.  Has a history of a rod in place, but fortunately does not appear to communicate with the abscess per the mother. She will need 8 weeks of IV cefazolin in a supervised setting through September 18.  She may need to continue with oral therapy after that.   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  She will need formal cardiology input.    4.  Access - difficult access.  Ok from [place] standpoint for a picc line tomorrow if needed.

## 2017-12-06 NOTE — Progress Notes (Signed)
Occupational Therapy Evaluation Patient Details Name: Kelly [name] MRN: [id number] DOB: [birth year] Today's Date: 12/06/2017    History of Present Illness 32 y.o. female admitted on 12-02-17 for fever and chills.  Pt dx with sepsis, tachycardia, tachypnea, leukocytosis, hypotension.  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  Pt underwent lumbarlaminectomy L3-S1 for drainage of abcess on 12-05-17.  Pt with known PMH of polysubstance abuse, ADD, scoliosis sp back surgery (20 years ago per mom).      Clinical Impression   PTA Pt using DME for mobility, living in hotel. Pt is currently max A +2 for bed mobility, total/max A for LB ADL, and mod A +2 for sit<>Stand this session. Pt will benefit from OT due to decreased balance, strength and conditioning, continued education for back precautions and compensatory strategies for ADL/functional transfers. Pt will benefit from CIR level therapy to address OT problem list. Next session potentially use [facility] to facilitate transfers? And intro to AE. Please take back handout. Reviewed verbally this session.     Follow Up Recommendations  CIR;Supervision/Assistance - 24 hour    Equipment Recommendations  Other (comment)(defer to next venue)    Recommendations for Other Services Rehab consult     Precautions / Restrictions Precautions Precautions: Back Precaution Booklet Issued: No Precaution Comments: reviewed verbally with Pt Restrictions Weight Bearing Restrictions: No      Mobility Bed Mobility Overal bed mobility: Needs Assistance Bed Mobility: Rolling;Sidelying to Sit;Sit to Sidelying Rolling: +2 for physical assistance;+2 for safety/equipment;Max assist(vc for sequencing) Sidelying to sit: Max assist;+2 for physical assistance;+2 for safety/equipment;HOB elevated(assist to bring legs  EOB and heavy lift for trunk - pt helps)     Sit to sidelying: Max assist;+2 for physical assistance;+2 for safety/equipment(bed rail, assist for BLE, support of trunk for descent) General bed mobility comments: Max assist to help flex knees and turn trunk to sidelying with step by step cues to use rail and preform log roll to protect back and help with pain, assist needed to help progress bil legs over EOB and to support trunk during transitions up and back down to side lying.   Transfers Overall transfer level: Needs assistance Equipment used: 2 person hand held assist Transfers: Sit to/from Stand Sit to Stand: Mod assist;+2 safety/equipment;+2 physical assistance         General transfer comment: sit <>stand x2, first with bed pad to scoop assist to block knees and power up, pt pulling with bil hands and left ankle with inversion moment in WB.      Balance Overall balance assessment: Needs assistance Sitting-balance support: Feet supported;Bilateral upper extremity supported Sitting balance-Leahy Scale: Fair Sitting balance - Comments: close supervision EOB.     Standing balance support: Bilateral upper extremity supported Standing balance-Leahy Scale: Poor Standing balance comment: dependent on two person heavy mod assist to stand.                             ADL either performed or assessed with clinical judgement   ADL Overall ADL's : Needs assistance/impaired Eating/Feeding: Set up;Bed level   Grooming: Wash/dry face;Minimal assistance;Sitting Grooming Details (indicate cue type and reason): EOB Upper Body Bathing: Maximal assistance   Lower Body Bathing: Total assistance   Upper Body Dressing : Maximal assistance;Sitting   Lower Body Dressing: Total assistance  Toilet Transfer: +2 for physical assistance;+2 for safety/equipment;Maximal assistance Toilet Transfer Details (indicate cue type and reason): simulated through stand, and pivot up higher in the  bed Toileting- Clothing Manipulation and Hygiene: Total assistance         General ADL Comments: educated on back precautions     Vision Baseline Vision/History: No visual deficits Patient Visual Report: No change from baseline       Perception     Praxis      Pertinent Vitals/Pain Pain Assessment: 0-10 Pain Score: 7  Pain Location: surgical site Pain Descriptors / Indicators: Constant;Discomfort;Sore Pain Intervention(s): Limited activity within patient's tolerance;Monitored during session;Premedicated before session;Repositioned     Hand Dominance Right   Extremity/Trunk Assessment Upper Extremity Assessment Upper Extremity Assessment: Defer to OT evaluation   Lower Extremity Assessment Lower Extremity Assessment: RLE deficits/detail;LLE deficits/detail RLE Deficits / Details: right leg is weaker than left leg with DF 3-/5, knee extension 2+/5, decreased sensation to LT.  RLE Sensation: decreased light touch RLE Coordination: decreased gross motor LLE Deficits / Details: mildly stronger than right leg ankle DF/PF 3-/5, knee extension 2+/5, pt unable to lift either leg against gravity in bed.  LLE Sensation: decreased light touch(but better than R) LLE Coordination: decreased gross motor   Cervical / Trunk Assessment Cervical / Trunk Assessment: Other exceptions Cervical / Trunk Exceptions: history of back sx, s/p back sx   Communication Communication Communication: No difficulties   Cognition Arousal/Alertness: Awake/alert Behavior During Therapy: Anxious Overall Cognitive Status: Within Functional Limits for tasks assessed                                 General Comments: Pt benefits from step by step directions prior to movement   General Comments  mother present throughout the session and encouraging of daughter.      Exercises     Shoulder Instructions      Home Living Family/patient expects to be discharged to::  Shelter/Homeless Living Arrangements: Other (Comment)(had been staying in a hotel "but I'm not going back there")                               Additional Comments: Per mom pt is homeless and was ambulatory at least at the beginning of the month without an assistive device.        Prior Functioning/Environment Level of Independence: Independent        Comments: progressive worsening LE weakness and numbness for the past few weeks per mom        OT Problem List: Decreased activity tolerance;Impaired balance (sitting and/or standing);Decreased range of motion;Decreased knowledge of use of DME or AE;Decreased knowledge of precautions;Pain      OT Treatment/Interventions: Self-care/ADL training;DME and/or AE instruction;Therapeutic activities;Patient/family education;Balance training    OT Goals(Current goals can be found in the care plan section) Acute Rehab OT Goals Patient Stated Goal: "get stronger and less pain" OT Goal Formulation: With patient/family Time For Goal Achievement: 12/20/17 Potential to Achieve Goals: Good ADL Goals Pt Will Perform Grooming: with set-up;sitting Pt Will Perform Lower Body Bathing: with set-up;with adaptive equipment;sitting/lateral leans Pt Will Perform Lower Body Dressing: with mod assist;sit to/from stand Pt Will Transfer to Toilet: with mod assist;stand pivot transfer;bedside commode Pt Will Perform Toileting - Clothing Manipulation and hygiene: with mod assist;sit to/from stand Additional ADL Goal #1: Pt will perform bed mobility (  maintaining back precautions) at Mod A prior to engagingin ADL activity  OT Frequency: Min 2X/week   Barriers to D/C:    Unsure of where Pt is discharging to       Co-evaluation PT/OT/SLP Co-Evaluation/Treatment: Yes Reason for Co-Treatment: Complexity of the patient's impairments (multi-system involvement);Necessary to address cognition/behavior during functional activity;For patient/therapist  safety PT goals addressed during session: Mobility/safety with mobility;Balance;Strengthening/ROM OT goals addressed during session: ADL's and self-care;Strengthening/ROM      AM-PAC PT "6 Clicks" Daily Activity     Outcome Measure Help from another person eating meals?: A Little Help from another person taking care of personal grooming?: A Little Help from another person toileting, which includes using toliet, bedpan, or urinal?: A Lot Help from another person bathing (including washing, rinsing, drying)?: A Lot Help from another person to put on and taking off regular upper body clothing?: A Lot Help from another person to put on and taking off regular lower body clothing?: Total 6 Click Score: 13   End of Session Nurse Communication: Mobility status  Activity Tolerance: Patient tolerated treatment well Patient left: in bed;with call bell/phone within reach;with nursing/sitter in room;with family/visitor present  OT Visit Diagnosis: Unsteadiness on feet (R26.81);Other abnormalities of gait and mobility (R26.89);Muscle weakness (generalized) (M62.81);Pain Pain - part of body: (back)                Time: 5217-4715 OT Time Calculation (min): 29 min Charges:  OT General Charges $OT Visit: 1 Visit OT Evaluation $OT Eval Moderate Complexity: 1 Mod  Hulda Humphrey OTR/L [place] 12/06/2017, 4:25 PM

## 2017-12-06 NOTE — Evaluation (Signed)
Physical Therapy Evaluation Patient Details Name: Kelly [name] MRN: [id number] DOB: November 12, 1985 Today's Date: 12/06/2017   History of Present Illness  32 y.o. female admitted on 12-02-17 for fever and chills.  Pt dx with sepsis, tachycardia, tachypnea, leukocytosis, hypotension.  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  Pt underwent lumbarlaminectomy L3-S1 for drainage of abcess on 12-05-17.  Pt with known PMH of polysubstance abuse, ADD, scoliosis sp back surgery (20 years ago per mom).     Clinical Impression  Pt requires a significant amount of assist to mobilize EOB.  She is limited by pain despite pre medication, but did well with planned mobility and bed change with RN staff.  She presents with bil LE weakness and paresthesias R weaker and more numb than left leg.  Steady standing frame may be useful for first attempts at OOB to chair.   PT to follow acutely for deficits listed below.       Follow Up Recommendations CIR    Equipment Recommendations  Wheelchair (measurements PT);Wheelchair cushion (measurements PT);3in1 (PT);Hospital bed    Recommendations for Other Services Rehab consult     Precautions / Restrictions Precautions Precautions: Back Precaution Booklet Issued: No Precaution Comments: reviewed verbally with Pt Restrictions Weight Bearing Restrictions: No      Mobility  Bed Mobility Overal bed mobility: Needs Assistance Bed Mobility: Rolling;Sidelying to Sit;Sit to Sidelying Rolling: +2 for physical assistance;+2 for safety/equipment;Max assist(vc for sequencing) Sidelying to sit: Max assist;+2 for physical assistance;+2 for safety/equipment;HOB elevated(assist to bring legs EOB and heavy lift for trunk - pt helps)     Sit to sidelying: Max assist;+2 for physical assistance;+2 for safety/equipment(bed  rail, assist for BLE, support of trunk for descent) General bed mobility comments: Max assist to help flex knees and turn trunk to sidelying with step by step cues to use rail and preform log roll to protect back and help with pain, assist needed to help progress bil legs over EOB and to support trunk during transitions up and back down to side lying.   Transfers Overall transfer level: Needs assistance Equipment used: 2 person hand held assist Transfers: Sit to/from Stand Sit to Stand: Mod assist;+2 safety/equipment;+2 physical assistance         General transfer comment: sit <>stand x2, first with bed pad to scoop assist to block knees and power up, pt pulling with bil hands and left ankle with inversion moment in WB.    Ambulation/Gait             General Gait Details: unable at this time.       Balance Overall balance assessment: Needs assistance Sitting-balance support: Feet supported;Bilateral upper extremity supported Sitting balance-Leahy Scale: Fair Sitting balance - Comments: close supervision EOB.     Standing balance support: Bilateral upper extremity supported Standing balance-Leahy Scale: Poor Standing balance comment: dependent on two person heavy mod assist to stand.                               Pertinent Vitals/Pain Pain Assessment: 0-10 Pain Score: 7  Pain Location: surgical site Pain Descriptors / Indicators: Constant;Discomfort;Sore Pain Intervention(s): Limited activity within patient's tolerance;Monitored during session;Premedicated before session;Repositioned    Home Living Family/patient expects to be discharged to:: Shelter/Homeless Living Arrangements: Other (Comment)(had been staying in a  hotel "but I'm not going back there")               Additional Comments: Per mom pt is homeless and was ambulatory at least at the beginning of the month without an assistive device.      Prior Function Level of Independence: Independent          Comments: progressive worsening LE weakness and numbness for the past few weeks per mom     Hand Dominance   Dominant Hand: Right    Extremity/Trunk Assessment   Upper Extremity Assessment Upper Extremity Assessment: Defer to OT evaluation    Lower Extremity Assessment Lower Extremity Assessment: RLE deficits/detail;LLE deficits/detail RLE Deficits / Details: right leg is weaker than left leg with DF 3-/5, knee extension 2+/5, decreased sensation to LT.  RLE Sensation: decreased light touch RLE Coordination: decreased gross motor LLE Deficits / Details: mildly stronger than right leg ankle DF/PF 3-/5, knee extension 2+/5, pt unable to lift either leg against gravity in bed.  LLE Sensation: decreased light touch(but better than R) LLE Coordination: decreased gross motor    Cervical / Trunk Assessment Cervical / Trunk Assessment: Other exceptions Cervical / Trunk Exceptions: history of back sx, s/p back sx  Communication   Communication: No difficulties  Cognition Arousal/Alertness: Awake/alert Behavior During Therapy: Anxious Overall Cognitive Status: Within Functional Limits for tasks assessed                                 General Comments: Pt benefits from step by step directions prior to movement      General Comments General comments (skin integrity, edema, etc.): mother present throughout the session and encouraging of daughter.      Exercises     Assessment/Plan    PT Assessment Patient needs continued PT services  PT Problem List Decreased strength;Decreased activity tolerance;Decreased balance;Decreased mobility;Decreased coordination;Decreased knowledge of use of DME;Decreased knowledge of precautions;Impaired sensation;Impaired tone;Pain;Obesity       PT Treatment Interventions DME instruction;Gait training;Stair training;Functional mobility training;Therapeutic activities;Therapeutic exercise;Balance training;Patient/family  education;Wheelchair mobility training;Neuromuscular re-education    PT Goals (Current goals can be found in the Care Plan section)  Acute Rehab PT Goals Patient Stated Goal: "get stronger and less pain" PT Goal Formulation: With patient Time For Goal Achievement: 12/20/17 Potential to Achieve Goals: Good    Frequency Min 5X/week   Barriers to discharge Decreased caregiver support;Inaccessible home environment pt lived at a hotel PTA, mom reports she is homeless    Co-evaluation PT/OT/SLP Co-Evaluation/Treatment: Yes Reason for Co-Treatment: Complexity of the patient's impairments (multi-system involvement);Necessary to address cognition/behavior during functional activity;For patient/therapist safety PT goals addressed during session: Mobility/safety with mobility;Balance;Strengthening/ROM OT goals addressed during session: ADL's and self-care;Strengthening/ROM       AM-PAC PT "6 Clicks" Daily Activity  Outcome Measure Difficulty turning over in bed (including adjusting bedclothes, sheets and blankets)?: Unable Difficulty moving from lying on back to sitting on the side of the bed? : Unable Difficulty sitting down on and standing up from a chair with arms (e.g., wheelchair, bedside commode, etc,.)?: Unable Help needed moving to and from a bed to chair (including a wheelchair)?: Total Help needed walking in hospital room?: Total Help needed climbing 3-5 steps with a railing? : Total 6 Click Score: 6    End of Session   Activity Tolerance: Patient limited by pain Patient left: in bed;with call bell/phone within reach;with family/visitor present;with nursing/sitter in  room Nurse Communication: Other (comment)(RN present for mobility) PT Visit Diagnosis: Muscle weakness (generalized) (M62.81);Difficulty in walking, not elsewhere classified (R26.2);Pain;Other symptoms and signs involving the nervous system (R29.898) Pain - Right/Left: (lower back) Pain - part of body: (back)     Time: 4514-6047 PT Time Calculation (min) (ACUTE ONLY): 31 min   Charges:         Wells Guiles B. Juleen Sorrels, PT, DPT [phone]   PT Evaluation $PT Eval Moderate Complexity: 1 Mod          12/06/2017, 4:22 PM

## 2017-12-06 NOTE — Consult Note (Addendum)
Cardiology Consultation:   Patient ID: BONNEE ZERTUCHE; [id number]; 1986-04-09   Admit date: 12/02/2017 Date of Consult: 12/06/2017  Primary Care Provider: Patient, No Pcp Per Primary Cardiologist: No primary care provider on file. new Primary Electrophysiologist:  na   Patient Profile:   ANADALAY MACDONELL is a 32 y.o. female with a hx of IV drug use, ADD, scoliosis with back surgery and fixing rods and admitted with sepsis, PNA,  And on TTE + for tricuspid vegentation who is being seen today for the evaluation of severe TR at the request of Dr. Linus Salmons with ID.  History of Present Illness:   Ms. Loughridge was admitted 12/02/17 with sepsis and PNA, known IV drug user, and on TTE + for tricuspid vegetation.   She then had TEE 12/03/17 with EF 60-65%, severe TR, and  1.7 cm x 1.0 cm likely vegetation on the septal leaflet of the tricuspid valve consistent with endocarditis.  Trivial MR, thickening of the mitral valve leaflet severe RA and mild LA enlargement.    She is followed by [facility].   MSSA bacteremia on ABX. She underwent surgery 12/05/17 for lumbar epidural abscess with L#, L4, L5 and S1 laminectomy for drainage of lumbar epidural abscess.    Pt stable post op.   Today pt is + [phone] and wt is up from 160 to 198 lbs according to wts in computer.     Na 135, K+ 4.5, Mg+ 1.3 Hgb 7.2, Wbc 11.8, plts 108  EKG 11/21/17 with ST at 107, with abnormal T waves. Similar to EKG 07/30/16 though now faster. I personally reviewed.   Currently vs stable, HR in 80s. She does have some SOB and edema in her Rt hand, occ chest pain.  Prior to admit no chest pain or cardiac issues   Past Medical History:  Diagnosis Date  . Abscess of breast    rt breast  . ADD (attention deficit disorder)   . Back pain   . Bipolar 1 disorder ([place])   . Hypertension   . Polysubstance abuse ([place])   . Suicidal overdose [facility])     Past Surgical History:  Procedure Laterality Date  . BACK SURGERY     scoliosis-in care everywhere 08/1999  . CESAREAN SECTION    . CHOLECYSTECTOMY  March 2015  . FOREIGN BODY REMOVAL Right 05/28/2016   Procedure: REMOVAL FOREIGN BODY RIGHT ARM;  Surgeon: Meredith Pel, MD;  Location: [place];  Service: Orthopedics;  Laterality: Right;  . IRRIGATION AND DEBRIDEMENT ABSCESS Right 07/13/2014   Procedure: IRRIGATION AND DEBRIDEMEN TRIGHT BREAST ABSCESS;  Surgeon: Donnie Mesa, MD;  Location: [place];  Service: General;  Laterality: Right;  . LUMBAR LAMINECTOMY/DECOMPRESSION MICRODISCECTOMY N/A 12/05/2017   Procedure: LUMBAR LAMINECTOMY FOR EPIDURAL ABSCESS;  Surgeon: Newman Pies, MD;  Location: [place];  Service: Neurosurgery;  Laterality: N/A;  . RADIOLOGY WITH ANESTHESIA N/A 12/05/2017   Procedure: MRI WITH ANESTHESIA OF THORCIC AND LUMBAR SPINE WITH AND WITHOUT;  Surgeon: Radiologist, Medication, MD;  Location: [place];  Service: Radiology;  Laterality: N/A;     Home Medications:  Prior to Admission medications   Medication Sig Start Date End Date Taking? Authorizing Provider  methadone (DOLOPHINE) 10 MG/5ML solution Take 100 mg by mouth daily.   Yes [provider]  buPROPion (WELLBUTRIN XL) 150 MG 24 hr tablet Take 1 tablet (150 mg total) by mouth daily. Patient not taking: Reported on 04/16/2017 08/04/16   Kerrie Buffalo, NP  gabapentin (NEURONTIN)  300 MG capsule Take 1 capsule (300 mg total) by mouth 3 (three) times daily. Patient not taking: Reported on 04/16/2017 08/03/16   Kerrie Buffalo, NP  hydrOXYzine (ATARAX/VISTARIL) 50 MG tablet Take 1 tablet (50 mg total) by mouth every 6 (six) hours as needed for anxiety. Patient not taking: Reported on 04/16/2017 08/03/16   Kerrie Buffalo, NP  ketorolac (TORADOL) 10 MG tablet Take 1 tablet (10 mg total) by mouth every 6 (six) hours as needed. Patient not taking: Reported on 12/02/2017 11/17/17   Isla Pence, MD  nicotine (NICODERM CQ - DOSED IN MG/24 HOURS) 21 mg/24hr patch Place 1 patch (21 mg total) onto  the skin daily. Patient not taking: Reported on 04/16/2017 08/04/16   Kerrie Buffalo, NP  phenazopyridine (PYRIDIUM) 200 MG tablet Take 1 tablet (200 mg total) by mouth 3 (three) times daily. Patient not taking: Reported on 12/02/2017 11/17/17   Isla Pence, MD  traZODone (DESYREL) 50 MG tablet Take 1 tablet (50 mg total) by mouth at bedtime as needed for sleep. Patient not taking: Reported on 04/16/2017 08/03/16   Kerrie Buffalo, NP    Inpatient Medications: Scheduled Meds: . acetaminophen  1,000 mg Oral Q6H  . bupivacaine liposome  20 mL Infiltration Once  . bupivacaine-EPINEPHrine  50 mL Infiltration Once  . cloNIDine  0.1 mg Oral BH-qamhs   Followed by  . [START ON 12/07/2017] cloNIDine  0.1 mg Oral QAC breakfast  . docusate sodium  100 mg Oral BID  . guaiFENesin  1,200 mg Oral BID  . heparin  5,000 Units Subcutaneous Q8H  . lactobacillus  1 g Oral TID WC  . mouth rinse  15 mL Mouth Rinse BID  . methadone  40 mg Oral Daily  . nicotine  21 mg Transdermal Daily  . sodium chloride flush  3 mL Intravenous Q12H  . sodium chloride flush  3 mL Intravenous Q12H  . sodium chloride flush  3 mL Intravenous Q12H   Continuous Infusions: . sodium chloride    . sodium chloride 100 mL/hr at 12/05/17 0800  . sodium chloride    .  ceFAZolin (ANCEF) IV    . lactated ringers     PRN Meds: sodium chloride, acetaminophen **OR** acetaminophen, bisacodyl, cyclobenzaprine, dicyclomine, HYDROmorphone (DILAUDID) injection, hydrOXYzine, ketorolac, lip balm, loperamide, menthol-cetylpyridinium **OR** phenol, methocarbamol, morphine injection, naproxen, ondansetron **OR** ondansetron (ZOFRAN) IV, ondansetron, oxyCODONE, sodium chloride flush, sodium chloride flush, traZODone, zolpidem  Allergies:    Allergies  Allergen Reactions  . Hydrocodone Itching  . Morphine And Related Nausea And Vomiting    Social History:   Social History   Socioeconomic History  . Marital status: Single    Spouse name:  Not on file  . Number of children: Not on file  . Years of education: Not on file  . Highest education level: Not on file  Occupational History  . Not on file  Social Needs  . Financial resource strain: Not on file  . Food insecurity:    Worry: Not on file    Inability: Not on file  . Transportation needs:    Medical: Not on file    Non-medical: Not on file  Tobacco Use  . Smoking status: Current Every Day Smoker    Packs/day: 1.00    Years: 13.00    Pack years: 13.00    Types: Cigarettes  . Smokeless tobacco: Never Used  Substance and Sexual Activity  . Alcohol use: Yes    Comment: one or two drinks weekly  .  Drug use: Yes    Types: Cocaine, Heroin, IV    Comment: heroin, crack cocaine  . Sexual activity: Yes    Birth control/protection: Condom  Lifestyle  . Physical activity:    Days per week: Not on file    Minutes per session: Not on file  . Stress: Not on file  Relationships  . Social connections:    Talks on phone: Not on file    Gets together: Not on file    Attends religious service: Not on file    Active member of club or organization: Not on file    Attends meetings of clubs or organizations: Not on file    Relationship status: Not on file  . Intimate partner violence:    Fear of current or ex partner: Not on file    Emotionally abused: Not on file    Physically abused: Not on file    Forced sexual activity: Not on file  Other Topics Concern  . Not on file  Social History Narrative  . Not on file    Family History:    Family History  Problem Relation Age of Onset  . Diabetes Mother   . Diabetes Father   . Hypertension Father   . Heart attack Neg Hx   . Hyperlipidemia Neg Hx   . Sudden death Neg Hx      ROS:  Please see the history of present illness.  General:no colds or fevers, no weight changes Skin:no rashes or ulcers HEENT:no blurred vision, no congestion CV:see HPI PUL:see HPI + SOB  GI:no diarrhea constipation or melena, no  indigestion GU:no hematuria, no dysuria MS:no joint pain, no claudication, + muscle pain on admit Neuro:no syncope, no lightheadedness Endo:no diabetes, no thyroid disease Psych:  Hx bipolar disorder   All other ROS reviewed and negative.     Physical Exam/Data:   Vitals:   12/06/17 0900 12/06/17 1000 12/06/17 1200 12/06/17 1430  BP: (!) 133/95 (!) 135/94 129/89   Pulse: 93 90 86 91  Resp: (!) 26 (!) 29 (!) 23 16  Temp:   98.6 F (37 C)   TempSrc:   Oral   SpO2: 93% 94% 94% 94%  Weight:      Height:        Intake/Output Summary (Last 24 hours) at 12/06/2017 1449 Last data filed at 12/06/2017 0700 Gross per 24 hour  Intake 1840 ml  Output 1705 ml  Net 135 ml   Filed Weights   12/02/17 1135 12/05/17 1128 12/05/17 2229  Weight: 160 lb (72.6 kg) 170 lb (77.1 kg) 198 lb 3.1 oz (89.9 kg)   Body mass index is 31.04 kg/m.  General:  Well nourished, well developed, in no acute distress but flat affect HEENT: normal Lymph: no adenopathy Neck: no JVD Endocrine:  No thryomegaly Vascular: No carotid bruits; pedal  pulses 2+ bilaterally Cardiac:  normal S1, S2; RRR; + 2-3/6  murmur - no rubs Lungs:  clear to auscultation bilaterally, from ant position no wheezing, rhonchi or rales  Abd: soft, nontender, no hepatomegaly  Ext: no edema of lower ext , + Lt had with edema Musculoskeletal:  No deformities, BUE and BLE strength normal and equal Skin: warm and dry  Neuro:  Alert and oriented X 3 MAE follows commands no focal abnormalities noted Psych:  flat affect    Relevant CV Studies: Echo TTE 12/03/17 Study Conclusions  - Left ventricle: The cavity size was normal. Systolic function was   normal. The  estimated ejection fraction was in the range of 55%   to 60%. Wall motion was normal; there were no regional wall   motion abnormalities. Left ventricular diastolic function   parameters were normal. - Aortic valve: Transvalvular velocity was within the normal range.   There  was no stenosis. There was no regurgitation. - Mitral valve: Thickening of the anterior leaflet. Transvalvular   velocity was within the normal range. There was no evidence for   stenosis. There was trivial regurgitation. - Left atrium: The atrium was mildly dilated. - Right ventricle: The cavity size was normal. Wall thickness was   normal. Systolic function was normal. - Right atrium: The atrium was severely dilated. - Tricuspid valve: There was a medium-sized, 1.0 cm (W) x 1.7 cm   (L) vegetation on the septal leaflet. There was severe   regurgitation. - Pulmonary arteries: Systolic pressure was mildly increased. PA   peak pressure: 42 mm Hg (S).  Impressions:  - Vegetation noted on the septal leaflet of the tricuspid valve   consistent with endocarditis. Cannot exlude vegetation on the   anterior leaflet of the mitral valve. Suggest TEE to better   assess.  TEE 12/03/17  Brief transthoracic echo report:  LVEF 60-65% Severe tricuspid regurgitation. 1.7 cm x 1.0 cm likely vegetation on the septal leaflet of the tricuspid valve consistent with endocarditis. Thickening of the mitral valve leaflet.  Cannot rule out endocarditis. Trivial MR Normal diastolic function No IVC dilatation. Severe RA and mild LA enlargement.    Laboratory Data:  Chemistry Recent Labs  Lab 12/04/17 0538 12/05/17 0324 12/06/17 0632  NA 136 138 135  K 4.0 4.1 4.5  CL 101 106 103  CO2 26 24 22   GLUCOSE 135* 100* 108*  BUN 12 11 6   CREATININE 0.68 0.57 0.60  CALCIUM 8.9 8.7* 8.2*  GFRNONAA >60 >60 >60  GFRAA >60 >60 >60  ANIONGAP 9 8 10     Recent Labs  Lab 12/02/17 1320 12/03/17 0327  PROT 7.2 6.6  ALBUMIN 2.4* 2.0*  AST 13* 14*  ALT 6 6  ALKPHOS 168* 211*  BILITOT 1.0 0.8   Hematology Recent Labs  Lab 12/04/17 0538 12/05/17 0324 12/06/17 0632  WBC 14.9* 11.1* 11.8*  RBC 3.00* 2.51* 2.59*  HGB 8.7* 7.2* 7.2*  HCT 27.1* 23.1* 23.8*  MCV 90.3 92.0 91.9  MCH 29.0 28.7  27.8  MCHC 32.1 31.2 30.3  RDW 14.3 14.7 14.0  PLT 444* 419* 414*   Cardiac EnzymesNo results for input(s): TROPONINI in the last 168 hours. No results for input(s): TROPIPOC in the last 168 hours.  BNPNo results for input(s): BNP, PROBNP in the last 168 hours.  DDimer No results for input(s): DDIMER in the last 168 hours.  Radiology/Studies:  Ct Chest W Contrast  Result Date: 12/02/2017 CLINICAL DATA:  Pneumonia, fever. History of IV drug abuse. Leukocytosis. EXAM: CT CHEST, ABDOMEN, AND PELVIS WITH CONTRAST TECHNIQUE: Multidetector CT imaging of the chest, abdomen and pelvis was performed following the standard protocol during bolus administration of intravenous contrast. CONTRAST:  122m ISOVUE-300 IOPAMIDOL (ISOVUE-300) INJECTION 61% COMPARISON:  11/17/2017. FINDINGS: CT CHEST FINDINGS Cardiovascular: Heart size is normal. No pericardial effusion. Normal 3 vessel aortic branching pattern. Normal appearance of the aorta. Mediastinum/Nodes: No enlarged mediastinal, hilar, or axillary lymph nodes. Thyroid gland, trachea, and esophagus demonstrate no significant findings. Lungs/Pleura: There is multifocal consolidation throughout both lungs, worst in the right middle lobe and both lower lobes. There are cavitary lesions in the  anterior right upper lobe and within the left upper lobe. No pleural effusion or pneumothorax. Airways are patent. Musculoskeletal: There are bilateral spinal rods extending from T4-L3. No lytic or blastic lesions. No evidence of discitis-osteomyelitis. CT ABDOMEN PELVIS FINDINGS HEPATOBILIARY: Normal hepatic contours and density. No intra- or extrahepatic biliary dilatation. Status post cholecystectomy. PANCREAS: Normal parenchymal contours without ductal dilatation. No peripancreatic fluid collection. SPLEEN: Normal. ADRENALS/URINARY TRACT: --Adrenal glands: Normal. --Right kidney/ureter: Linear, nonobstructive interpolar calculus measures up to 13 mm. --Left kidney/ureter: No  hydronephrosis, nephroureterolithiasis, perinephric stranding or solid renal mass. --Urinary bladder: Normal for degree of distention STOMACH/BOWEL: --Stomach/Duodenum: No hiatal hernia or other gastric abnormality. Normal duodenal course. --Small bowel: No dilatation or inflammation. --Colon: No focal abnormality. --Appendix: Normal. VASCULAR/LYMPHATIC: Normal course and caliber of the major abdominal vessels. No abdominal or pelvic lymphadenopathy. REPRODUCTIVE: No free fluid in the pelvis. MUSCULOSKELETAL. No bony spinal canal stenosis or focal osseous abnormality. OTHER: None. IMPRESSION: 1. Multifocal consolidation within both lungs, including multiple cavitary lesions, consistent with pneumonia secondary to septic emboli. 2. No other acute abnormality of the chest, abdomen or pelvis. Electronically Signed   By: Ulyses Jarred M.D.   On: 12/02/2017 18:23   Mr Thoracic Spine W Wo Contrast  Result Date: 12/05/2017 CLINICAL DATA:  Fevers, chills, endocarditis resulting and pulmonary abscess. History of polysubstance abuse and scoliosis. EXAM: MRI THORACIC AND LUMBAR SPINE WITHOUT AND WITH CONTRAST TECHNIQUE: Multiplanar and multiecho pulse sequences of the thoracic and lumbar spine were obtained without and with intravenous contrast. CONTRAST:  74m MULTIHANCE GADOBENATE DIMEGLUMINE 529 MG/ML IV SOLN COMPARISON:  MRI of the thoracic and lumbar spine November 21, 2017. FINDINGS: MRI THORACIC SPINE FINDINGS-spinal hardware resulting in extensive susceptibility artifact. ALIGNMENT: Straightened of the thoracic kyphosis. No malalignment. Scoliosis on this nonweightbearing examination. VERTEBRAE/DISCS: T3 through lumbar Harrington rods with laminar hooks. Vertebral bodies are intact. Intervertebral discs morphology and signal are normal though limited assessment. No abnormal bone marrow signal though limited STIR sequence. Vertebral bodies predominance cured on post gadolinium T1 sequence. CORD: Thoracic spinal cord is  normal morphology and signal characteristics to the level of the conus medullaris which terminates at T12-L1. PREVERTEBRAL AND PARASPINAL SOFT TISSUES: Mild paraspinal muscle atrophy with no suspicious signal. Small RIGHT pleural effusion and multifocal consolidation. DISC LEVELS: No disc bulge, canal stenosis of upper thoracic spine to T3-4, below levels predominant obscured. MRI LUMBAR SPINE FINDINGS--spinal hardware resulting in extensive susceptibility artifact. SEGMENTATION: For the purposes of this report, the last well-formed intervertebral disc is reported as L5-S1. ALIGNMENT: Maintained lumbar lordosis. No malalignment. Thoracic through L3 Harrington rods with laminar hooks. VERTEBRAE:Vertebral bodies are intact. Moderate L4-5 and L5-S1 disc height loss with increasing disc edema, no enhancement. No suspicious vertebral body enhancement. Abnormal signal enhancement LEFT sacrum. CONUS MEDULLARIS AND CAUDA EQUINA: Conus medullaris obscured. Thecal sac effacement due to epidural collection with rim enhancement measuring to 17 x 9 mm (transverse by AP) at L4-5. Thecal sac is to 4 mm in AP dimension at L4-5. PARASPINAL AND OTHER SOFT TISSUES: Bright STIR signal in suspected enhancement paraspinal muscles lower lumbar spine. 2 cm probable fluid collection LEFT paraspinal muscles at L4-5. Abnormal signal enhancement L4-5 and possibly L5-S1 facets. DISC LEVELS: L1-2 and L2-3: Obscured. L3-4: Thecal sac effacement due to epidural collection. Severe facet arthropathy with abnormal enhancement. No osseous canal stenosis or neural foraminal narrowing. L4-5: Small broad-based disc bulge asymmetric to the LEFT. Thecal sac effacement due to epidural collection. Moderate facet arthropathy and ligamentum flavum redundancy. Mild  neural foraminal narrowing. L5-S1: 6 x 7 mm RIGHT central disc extrusion versus abscess displacing the traversing RIGHT S1 nerve. Annular fissure versus annular enhancement from infection. Moderate  RIGHT and severe LEFT facet arthropathy without canal stenosis. Moderate RIGHT, moderate to severe LEFT neural foraminal narrowing. IMPRESSION: MRI thoracic spine: 1. Scoliosis and posterior spinal fixation rods limiting assessment. 2. Stable appearance without imaging evidence of acute osseous process or infection in the spine. 3. Bilateral lung consolidation consistent with history of septic emboli. Small RIGHT pleural effusion. MRI lumbar spine: 1. Scoliosis and posterior spinal fixation rods limiting assessment. 2. New epidural abscess lower lumbar spine effacing thecal sac. 3. Suspected lower lumbar facet septic arthropathy. Probable paraspinal myositis and 2 cm abscess. 4. L5-S1 6 x 7 mm disc extrusion versus abscess. Equivocal L5-S1 discitis. 5. 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 These results will be called to the ordering clinician or representative by the Radiologist Assistant, and communication documented in the PACS or zVision Dashboard. Electronically Signed   By: Elon Alas M.D.   On: 12/05/2017 17:18   Mr Lumbar Spine W Wo Contrast  Result Date: 12/05/2017 CLINICAL DATA:  Fevers, chills, endocarditis resulting and pulmonary abscess. History of polysubstance abuse and scoliosis. EXAM: MRI THORACIC AND LUMBAR SPINE WITHOUT AND WITH CONTRAST TECHNIQUE: Multiplanar and multiecho pulse sequences of the thoracic and lumbar spine were obtained without and with intravenous contrast. CONTRAST:  43m MULTIHANCE GADOBENATE DIMEGLUMINE 529 MG/ML IV SOLN COMPARISON:  MRI of the thoracic and lumbar spine November 21, 2017. FINDINGS: MRI THORACIC SPINE FINDINGS-spinal hardware resulting in extensive susceptibility artifact. ALIGNMENT: Straightened of the thoracic kyphosis. No malalignment. Scoliosis on this nonweightbearing examination. VERTEBRAE/DISCS: T3 through lumbar Harrington rods with laminar hooks. Vertebral bodies are intact. Intervertebral discs morphology and signal are normal  though limited assessment. No abnormal bone marrow signal though limited STIR sequence. Vertebral bodies predominance cured on post gadolinium T1 sequence. CORD: Thoracic spinal cord is normal morphology and signal characteristics to the level of the conus medullaris which terminates at T12-L1. PREVERTEBRAL AND PARASPINAL SOFT TISSUES: Mild paraspinal muscle atrophy with no suspicious signal. Small RIGHT pleural effusion and multifocal consolidation. DISC LEVELS: No disc bulge, canal stenosis of upper thoracic spine to T3-4, below levels predominant obscured. MRI LUMBAR SPINE FINDINGS--spinal hardware resulting in extensive susceptibility artifact. SEGMENTATION: For the purposes of this report, the last well-formed intervertebral disc is reported as L5-S1. ALIGNMENT: Maintained lumbar lordosis. No malalignment. Thoracic through L3 Harrington rods with laminar hooks. VERTEBRAE:Vertebral bodies are intact. Moderate L4-5 and L5-S1 disc height loss with increasing disc edema, no enhancement. No suspicious vertebral body enhancement. Abnormal signal enhancement LEFT sacrum. CONUS MEDULLARIS AND CAUDA EQUINA: Conus medullaris obscured. Thecal sac effacement due to epidural collection with rim enhancement measuring to 17 x 9 mm (transverse by AP) at L4-5. Thecal sac is to 4 mm in AP dimension at L4-5. PARASPINAL AND OTHER SOFT TISSUES: Bright STIR signal in suspected enhancement paraspinal muscles lower lumbar spine. 2 cm probable fluid collection LEFT paraspinal muscles at L4-5. Abnormal signal enhancement L4-5 and possibly L5-S1 facets. DISC LEVELS: L1-2 and L2-3: Obscured. L3-4: Thecal sac effacement due to epidural collection. Severe facet arthropathy with abnormal enhancement. No osseous canal stenosis or neural foraminal narrowing. L4-5: Small broad-based disc bulge asymmetric to the LEFT. Thecal sac effacement due to epidural collection. Moderate facet arthropathy and ligamentum flavum redundancy. Mild neural  foraminal narrowing. L5-S1: 6 x 7 mm RIGHT central disc extrusion versus abscess displacing the traversing RIGHT S1 nerve.  Annular fissure versus annular enhancement from infection. Moderate RIGHT and severe LEFT facet arthropathy without canal stenosis. Moderate RIGHT, moderate to severe LEFT neural foraminal narrowing. IMPRESSION: MRI thoracic spine: 1. Scoliosis and posterior spinal fixation rods limiting assessment. 2. Stable appearance without imaging evidence of acute osseous process or infection in the spine. 3. Bilateral lung consolidation consistent with history of septic emboli. Small RIGHT pleural effusion. MRI lumbar spine: 1. Scoliosis and posterior spinal fixation rods limiting assessment. 2. New epidural abscess lower lumbar spine effacing thecal sac. 3. Suspected lower lumbar facet septic arthropathy. Probable paraspinal myositis and 2 cm abscess. 4. L5-S1 6 x 7 mm disc extrusion versus abscess. Equivocal L5-S1 discitis. 5. 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 These results will be called to the ordering clinician or representative by the Radiologist Assistant, and communication documented in the PACS or zVision Dashboard. Electronically Signed   By: Elon Alas M.D.   On: 12/05/2017 17:18   Ct Abdomen Pelvis W Contrast  Result Date: 12/02/2017 CLINICAL DATA:  Pneumonia, fever. History of IV drug abuse. Leukocytosis. EXAM: CT CHEST, ABDOMEN, AND PELVIS WITH CONTRAST TECHNIQUE: Multidetector CT imaging of the chest, abdomen and pelvis was performed following the standard protocol during bolus administration of intravenous contrast. CONTRAST:  112m ISOVUE-300 IOPAMIDOL (ISOVUE-300) INJECTION 61% COMPARISON:  11/17/2017. FINDINGS: CT CHEST FINDINGS Cardiovascular: Heart size is normal. No pericardial effusion. Normal 3 vessel aortic branching pattern. Normal appearance of the aorta. Mediastinum/Nodes: No enlarged mediastinal, hilar, or axillary lymph nodes. Thyroid gland,  trachea, and esophagus demonstrate no significant findings. Lungs/Pleura: There is multifocal consolidation throughout both lungs, worst in the right middle lobe and both lower lobes. There are cavitary lesions in the anterior right upper lobe and within the left upper lobe. No pleural effusion or pneumothorax. Airways are patent. Musculoskeletal: There are bilateral spinal rods extending from T4-L3. No lytic or blastic lesions. No evidence of discitis-osteomyelitis. CT ABDOMEN PELVIS FINDINGS HEPATOBILIARY: Normal hepatic contours and density. No intra- or extrahepatic biliary dilatation. Status post cholecystectomy. PANCREAS: Normal parenchymal contours without ductal dilatation. No peripancreatic fluid collection. SPLEEN: Normal. ADRENALS/URINARY TRACT: --Adrenal glands: Normal. --Right kidney/ureter: Linear, nonobstructive interpolar calculus measures up to 13 mm. --Left kidney/ureter: No hydronephrosis, nephroureterolithiasis, perinephric stranding or solid renal mass. --Urinary bladder: Normal for degree of distention STOMACH/BOWEL: --Stomach/Duodenum: No hiatal hernia or other gastric abnormality. Normal duodenal course. --Small bowel: No dilatation or inflammation. --Colon: No focal abnormality. --Appendix: Normal. VASCULAR/LYMPHATIC: Normal course and caliber of the major abdominal vessels. No abdominal or pelvic lymphadenopathy. REPRODUCTIVE: No free fluid in the pelvis. MUSCULOSKELETAL. No bony spinal canal stenosis or focal osseous abnormality. OTHER: None. IMPRESSION: 1. Multifocal consolidation within both lungs, including multiple cavitary lesions, consistent with pneumonia secondary to septic emboli. 2. No other acute abnormality of the chest, abdomen or pelvis. Electronically Signed   By: KUlyses JarredM.D.   On: 12/02/2017 18:23   Dg Lumbar Spine 1 View  Result Date: 12/05/2017 CLINICAL DATA:  Lumbar laminectomy.  Epidural abscess. EXAM: LUMBAR SPINE - 1 VIEW COMPARISON:  MRI lumbar spine  12/05/2017 FINDINGS: Cross-table lateral intraoperative examination is obtained demonstrating surgical retractors posterior to the lumbar spine and a localization marker projected over the soft tissues posterior to the L4-5 lumbar interspace. Rod and screw fixations are demonstrated extending from the thoracic spine down to the level of L3. Alignment of the lumbar spine appears intact. IMPRESSION: Intraoperative localization of L4-5. Electronically Signed   By: WLucienne CapersM.D.   On: 12/05/2017 22:50  Assessment and Plan:   1. Severe TR -+ fluid balance with treatment for sepsis , once bacteremia is cleared will repeat echo and may need TVR. Dr. Gwenlyn Found has seen  2. 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 POD # 1 for epidural abscess and drainage followed by Neuro surgery 4. Polysubstance abuse followed by [facility].    For questions or updates, please contact [place] Please consult [email or web address] for contact info under Cardiology/STEMI.   Signed, Cecilie Kicks, NP  12/06/2017 2:49 PM  Agree with note written by Cecilie Kicks RNP  We are asked to see this unfortunate 32 year old Caucasian female admitted on Tuesday.  She is an IV drug abuser.  She had a epidural abscess which was drained with history of Harrington rod placement for scoliosis.  2D echo revealed severe TR with a vegetation on her septal leaflet which was confirmed by TEE.  She is currently on antibiotics.  Her white count is declining.  She has gained a significant amount of weight since admission.  Her exam is relatively benign.  She is a soft systolic murmur at the base.  It is unclear whether she will need tricuspid valve replacement at this time.  We will recheck a 2D echo after 6 weeks of antibiotic therapy and make a determination at that time based on letter cardiographic criteria as well as clinical criteria.  Quay Burow 12/06/2017 4:03 PM

## 2017-12-06 NOTE — Care Management Note (Signed)
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  Patient also complained of back pain.  MRI of the lumbar spine suggested epidural abscess.  Patient was transferred to [facility] and underwent lumbar laminectomy for drainage of the epidural abscess.  PTA, pt independent of ADLs.                      Action/Plan: Pt will need IV antibiotic therapy until September 18, per ID.   Pt agreeable to SNF placement, if needed, though placement unlikely due to current IVDU and Methadone use.  Should pt be able to wean from Methadone, we may be able to place pt in SNF, though it will be difficult.    Expected Discharge Date:  (unknown)               Expected Discharge Plan:  Skilled Nursing Facility  In-House Referral:  Clinical Social Work  Discharge planning Services  CM Consult  Post Acute Care Choice:    Choice offered to:     DME Arranged:    DME Agency:     HH Arranged:    Anna Agency:     Status of Service:  In process, will continue to follow  If discussed at Long Length of Stay Meetings, dates discussed:    Additional Comments:  Reinaldo Raddle, RN, BSN  Trauma/Neuro ICU Case Manager [phone]

## 2017-12-06 NOTE — Progress Notes (Signed)
Rehab Admissions Coordinator Note:  Patient was screened by Cleatrice Burke for appropriateness for an Inpatient Acute Rehab Consult per PT and OT recommendation. Noted pt h/o IVDU and will need IV antibiotics until 9/18. She will be difficult to place due to this need with her h/o of IVDU and methadone use. Inpt rehab venue not appropriate for we have average LOS of 10 to 14 days with pt's need for prolonged medical and rehab needs. Please call me with any questions.  Danne Baxter, RN, MSN Rehab Admissions Coordinator [phone] 12/06/2017 6:39 PM

## 2017-12-06 NOTE — Progress Notes (Addendum)
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  Patient also complained of back pain.  MRI of the lumbar spine suggested epidural abscess.  Patient was transferred to [facility] and underwent lumbar laminectomy for drainage of the epidural abscess.  Reason for Visit: MSSA bacteremia.  Epidural abscess.  Endocarditis.  Consultants: Infectious disease.  Neurosurgery.  Cardiology  Procedures:   L3, L4, L5 and S1 laminectomy for drainage of lumbar epidural abscess on 7/25  Transthoracic echocardiogram Study Conclusions  - Left ventricle: The cavity size was normal. Systolic function was   normal. The estimated ejection fraction was in the range of 55%   to 60%. Wall motion was normal; there were no regional wall   motion abnormalities. Left ventricular diastolic function   parameters were normal. - Aortic valve: Transvalvular velocity was within the normal range.   There was no stenosis. There was no regurgitation. - Mitral valve: Thickening of the anterior leaflet. Transvalvular   velocity was within the normal range. There was no evidence for   stenosis. There was trivial regurgitation. - Left atrium: The atrium was mildly dilated. - Right ventricle: The cavity size was normal. Wall thickness was   normal. Systolic function was normal. - Right atrium: The atrium was severely dilated. - Tricuspid valve: There was a medium-sized, 1.0 cm (W) x 1.7 cm   (L) vegetation on the septal leaflet. There was severe   regurgitation. - Pulmonary arteries: Systolic pressure was  mildly increased. PA   peak pressure: 42 mm Hg (S).  Impressions:  - Vegetation noted on the septal leaflet of the tricuspid valve   consistent with endocarditis. Cannot exlude vegetation on the   anterior leaflet of the mitral valve. Suggest TEE to better   assess.  Antibiotics: She was on cefazolin  Started on vancomycin and ceftriaxone by neurosurgery  Subjective/Interval History: Patient complains of severe lower back pain.  She had surgery last night.  Has not been able to sleep.  Denies any nausea vomiting.  Some difficulty breathing.  Denies any chest pain.  ROS: Denies any headaches  Objective:  Vital Signs  Vitals:   12/06/17 0537 12/06/17 0545 12/06/17 0745 12/06/17 0900  BP:   129/86 (!) 133/95  Pulse: 90 91 92 93  Resp: (!) 33 (!) 0 (!) 21 (!) 26  Temp:   98.3 F (36.8 C)   TempSrc:   Oral   SpO2: 99% 92% 97% 93%  Weight:      Height:        Intake/Output Summary (Last 24 hours) at 12/06/2017 0950 Last data filed at 12/06/2017 0700 Gross per 24 hour  Intake 1840 ml  Output 3405 ml  Net -1565 ml   Filed Weights   12/02/17 1135 12/05/17 1128 12/05/17 2229  Weight: 72.6 kg (160 lb) 77.1 kg (170 lb) 89.9 kg (198 lb 3.1 oz)    General appearance: alert, cooperative, appears stated age and no distress Head: Normocephalic, without obvious abnormality, atraumatic Resp: Diminished air entry at the bases.  Coarse breath sounds.  Few crackles.  No wheezing or rhonchi.  Normal effort at rest. Cardio: regular rate and  rhythm, S1, S2 normal, no murmur, click, rub or gallop GI: soft, non-tender; bowel sounds normal; no masses,  no organomegaly Extremities: extremities normal, atraumatic, no cyanosis or edema Pulses: 2+ and symmetric Neurologic: No obvious focal neurological deficits.  Able to move her lower extremities.  Lab Results:  Data Reviewed: I have personally reviewed following labs and imaging studies  CBC: Recent Labs  Lab 12/02/17 1320  12/03/17 0327 12/04/17 0538 12/05/17 0324 12/06/17 0632  WBC 28.3* 21.9* 14.9* 11.1* 11.8*  HGB 9.5* 9.6* 8.7* 7.2* 7.2*  HCT 28.7* 29.2* 27.1* 23.1* 23.8*  MCV 87.8 88.0 90.3 92.0 91.9  PLT 402* 358 444* 419* 414*    Basic Metabolic Panel: Recent Labs  Lab 12/02/17 1320 12/03/17 0327 12/04/17 0538 12/05/17 0324 12/06/17 0632  NA 130* 133* 136 138 135  K 3.6 4.2 4.0 4.1 4.5  CL 90* 98 101 106 103  CO2 27 27 26 24 22   GLUCOSE 113* 109* 135* 100* 108*  BUN 23* 17 12 11 6   CREATININE 1.04* 0.88 0.68 0.57 0.60  CALCIUM 8.9 8.5* 8.9 8.7* 8.2*  MG  --   --   --   --  1.3*    GFR: Estimated Creatinine Clearance: 115.1 mL/min (by C-G formula based on SCr of 0.6 mg/dL).  Liver Function Tests: Recent Labs  Lab 12/02/17 1320 12/03/17 0327  AST 13* 14*  ALT 6 6  ALKPHOS 168* 211*  BILITOT 1.0 0.8  PROT 7.2 6.6  ALBUMIN 2.4* 2.0*      Recent Results (from the past 240 hour(s))  Blood culture (routine x 2)     Status: Abnormal   Collection Time: 12/02/17  2:55 PM  Result Value Ref Range Status   Specimen Description   Final    BLOOD LEFT FOOT Performed at [facility] [address] [place], [place] [address]    Special Requests   Final    BOTTLES DRAWN AEROBIC AND ANAEROBIC Blood Culture results may not be optimal due to an excessive volume of blood received in culture bottles Performed at [facility] [address], [place], [place] [address]    Culture  Setup Time   Final    IN BOTH AEROBIC AND ANAEROBIC BOTTLES GRAM POSITIVE COCCI CRITICAL RESULT CALLED TO, READ BACK BY AND VERIFIED WITH: B GREEN PHARMD 12/03/17 0532 JDW Performed at [facility], [place] [address], [place], [place] [address]    Culture STAPHYLOCOCCUS AUREUS (A)  Final   Report Status 12/05/2017 FINAL  Final   Organism ID, Bacteria STAPHYLOCOCCUS AUREUS  Final      Susceptibility   Staphylococcus aureus - MIC*    CIPROFLOXACIN <=0.5 SENSITIVE  Sensitive     ERYTHROMYCIN >=8 RESISTANT Resistant     GENTAMICIN <=0.5 SENSITIVE Sensitive     OXACILLIN <=0.25 SENSITIVE Sensitive     TETRACYCLINE 8 INTERMEDIATE Intermediate     VANCOMYCIN <=0.5 SENSITIVE Sensitive     TRIMETH/SULFA <=10 SENSITIVE Sensitive     CLINDAMYCIN <=0.25 SENSITIVE Sensitive     RIFAMPIN <=0.5 SENSITIVE Sensitive     Inducible Clindamycin NEGATIVE Sensitive     * STAPHYLOCOCCUS AUREUS  Blood Culture ID Panel (Reflexed)     Status: Abnormal   Collection Time: 12/02/17  2:55 PM  Result Value Ref Range Status   Enterococcus species NOT DETECTED NOT DETECTED Final   Listeria monocytogenes NOT DETECTED NOT DETECTED Final   Staphylococcus species DETECTED (A) NOT DETECTED Final  Comment: CRITICAL RESULT CALLED TO, READ BACK BY AND VERIFIED WITH: B GREEN PHARMD 12/03/17 0532 JDW    Staphylococcus aureus DETECTED (A) NOT DETECTED Final    Comment: Methicillin (oxacillin) susceptible Staphylococcus aureus (MSSA). Preferred therapy is anti staphylococcal beta lactam antibiotic (Cefazolin or Nafcillin), unless clinically contraindicated. CRITICAL RESULT CALLED TO, READ BACK BY AND VERIFIED WITH: B GREEN PHARMD 12/03/17 0532 JDW    Methicillin resistance NOT DETECTED NOT DETECTED Final   Streptococcus species NOT DETECTED NOT DETECTED Final   Streptococcus agalactiae NOT DETECTED NOT DETECTED Final   Streptococcus pneumoniae NOT DETECTED NOT DETECTED Final   Streptococcus pyogenes NOT DETECTED NOT DETECTED Final   Acinetobacter baumannii NOT DETECTED NOT DETECTED Final   Enterobacteriaceae species NOT DETECTED NOT DETECTED Final   Enterobacter cloacae complex NOT DETECTED NOT DETECTED Final   Escherichia coli NOT DETECTED NOT DETECTED Final   Klebsiella oxytoca NOT DETECTED NOT DETECTED Final   Klebsiella pneumoniae NOT DETECTED NOT DETECTED Final   Proteus species NOT DETECTED NOT DETECTED Final   Serratia marcescens NOT DETECTED NOT DETECTED Final    Haemophilus influenzae NOT DETECTED NOT DETECTED Final   Neisseria meningitidis NOT DETECTED NOT DETECTED Final   Pseudomonas aeruginosa NOT DETECTED NOT DETECTED Final   Candida albicans NOT DETECTED NOT DETECTED Final   Candida glabrata NOT DETECTED NOT DETECTED Final   Candida krusei NOT DETECTED NOT DETECTED Final   Candida parapsilosis NOT DETECTED NOT DETECTED Final   Candida tropicalis NOT DETECTED NOT DETECTED Final    Comment: Performed at [facility], [place]. [address], [place], [place] [address]  Blood culture (routine x 2)     Status: Abnormal   Collection Time: 12/02/17  3:57 PM  Result Value Ref Range Status   Specimen Description   Final    BLOOD RIGHT ARM Performed at [facility] [address], [place], [place] [address]    Special Requests   Final    BOTTLES DRAWN AEROBIC AND ANAEROBIC Blood Culture adequate volume Performed at [facility] [address], [place], [place] [address]    Culture  Setup Time   Final    IN BOTH AEROBIC AND ANAEROBIC BOTTLES GRAM POSITIVE COCCI CRITICAL VALUE NOTED.  VALUE IS CONSISTENT WITH PREVIOUSLY REPORTED AND CALLED VALUE.    Culture (A)  Final    STAPHYLOCOCCUS AUREUS SUSCEPTIBILITIES PERFORMED ON PREVIOUS CULTURE WITHIN THE LAST 5 DAYS. Performed at [facility], [place] [address] [place], [place] [address]    Report Status 12/05/2017 FINAL  Final  MRSA PCR Screening     Status: None   Collection Time: 12/02/17  6:20 PM  Result Value Ref Range Status   MRSA by PCR NEGATIVE NEGATIVE Final    Comment:        The GeneXpert MRSA Assay (FDA approved for NASAL specimens only), is one component of a comprehensive MRSA colonization surveillance program. It is not intended to diagnose MRSA infection nor to guide or monitor treatment for MRSA infections. Performed at [facility], [place] [address], [place], [place] [address]   Culture, blood (routine x 2)      Status: None (Preliminary result)   Collection Time: 12/04/17  8:03 AM  Result Value Ref Range Status   Specimen Description   Final    BLOOD LEFT ANTECUBITAL Performed at [facility] [address] [place], [place] [address]    Special Requests   Final  BOTTLES DRAWN AEROBIC ONLY Blood Culture adequate volume Performed at [facility] [address], [place], [place] [address]    Culture   Final    NO GROWTH 2 DAYS Performed at [facility] [address] [place], [place] [address]    Report Status PENDING  Incomplete  Culture, blood (routine x 2)     Status: None (Preliminary result)   Collection Time: 12/04/17  8:13 AM  Result Value Ref Range Status   Specimen Description   Final    BLOOD LEFT HAND Performed at [facility] [address], [place], [place] [address]    Special Requests   Final    BOTTLES DRAWN AEROBIC ONLY Blood Culture adequate volume Performed at [facility] [address], [place], [place] [address]    Culture   Final    NO GROWTH 2 DAYS Performed at [facility] [address], [place], [place] [address]    Report Status PENDING  Incomplete  Aerobic/Anaerobic Culture (surgical/deep wound)     Status: None (Preliminary result)   Collection Time: 12/05/17  8:03 PM  Result Value Ref Range Status   Specimen Description ABSCESS  Final   Special Requests LUMBAR EPIDURAL  Final   Gram Stain   Final    ABUNDANT WBC PRESENT, PREDOMINANTLY PMN MODERATE GRAM POSITIVE COCCI Performed at [facility], [place] [address], [place], [place] [address]    Culture PENDING  Incomplete   Report Status PENDING  Incomplete      Radiology Studies: Mr Thoracic Spine W Wo Contrast  Result Date: 12/05/2017 CLINICAL DATA:  Fevers, chills, endocarditis resulting and pulmonary abscess. History of polysubstance abuse and scoliosis. EXAM: MRI THORACIC AND LUMBAR SPINE  WITHOUT AND WITH CONTRAST TECHNIQUE: Multiplanar and multiecho pulse sequences of the thoracic and lumbar spine were obtained without and with intravenous contrast. CONTRAST:  60m MULTIHANCE GADOBENATE DIMEGLUMINE 529 MG/ML IV SOLN COMPARISON:  MRI of the thoracic and lumbar spine November 21, 2017. FINDINGS: MRI THORACIC SPINE FINDINGS-spinal hardware resulting in extensive susceptibility artifact. ALIGNMENT: Straightened of the thoracic kyphosis. No malalignment. Scoliosis on this nonweightbearing examination. VERTEBRAE/DISCS: T3 through lumbar Harrington rods with laminar hooks. Vertebral bodies are intact. Intervertebral discs morphology and signal are normal though limited assessment. No abnormal bone marrow signal though limited STIR sequence. Vertebral bodies predominance cured on post gadolinium T1 sequence. CORD: Thoracic spinal cord is normal morphology and signal characteristics to the level of the conus medullaris which terminates at T12-L1. PREVERTEBRAL AND PARASPINAL SOFT TISSUES: Mild paraspinal muscle atrophy with no suspicious signal. Small RIGHT pleural effusion and multifocal consolidation. DISC LEVELS: No disc bulge, canal stenosis of upper thoracic spine to T3-4, below levels predominant obscured. MRI LUMBAR SPINE FINDINGS--spinal hardware resulting in extensive susceptibility artifact. SEGMENTATION: For the purposes of this report, the last well-formed intervertebral disc is reported as L5-S1. ALIGNMENT: Maintained lumbar lordosis. No malalignment. Thoracic through L3 Harrington rods with laminar hooks. VERTEBRAE:Vertebral bodies are intact. Moderate L4-5 and L5-S1 disc height loss with increasing disc edema, no enhancement. No suspicious vertebral body enhancement. Abnormal signal enhancement LEFT sacrum. CONUS MEDULLARIS AND CAUDA EQUINA: Conus medullaris obscured. Thecal sac effacement due to epidural collection with rim enhancement measuring to 17 x 9 mm (transverse by AP) at L4-5. Thecal sac  is to 4 mm in AP dimension at L4-5. PARASPINAL AND OTHER SOFT TISSUES: Bright STIR signal in suspected enhancement paraspinal muscles lower lumbar spine. 2 cm probable  fluid collection LEFT paraspinal muscles at L4-5. Abnormal signal enhancement L4-5 and possibly L5-S1 facets. DISC LEVELS: L1-2 and L2-3: Obscured. L3-4: Thecal sac effacement due to epidural collection. Severe facet arthropathy with abnormal enhancement. No osseous canal stenosis or neural foraminal narrowing. L4-5: Small broad-based disc bulge asymmetric to the LEFT. Thecal sac effacement due to epidural collection. Moderate facet arthropathy and ligamentum flavum redundancy. Mild neural foraminal narrowing. L5-S1: 6 x 7 mm RIGHT central disc extrusion versus abscess displacing the traversing RIGHT S1 nerve. Annular fissure versus annular enhancement from infection. Moderate RIGHT and severe LEFT facet arthropathy without canal stenosis. Moderate RIGHT, moderate to severe LEFT neural foraminal narrowing. IMPRESSION: MRI thoracic spine: 1. Scoliosis and posterior spinal fixation rods limiting assessment. 2. Stable appearance without imaging evidence of acute osseous process or infection in the spine. 3. Bilateral lung consolidation consistent with history of septic emboli. Small RIGHT pleural effusion. MRI lumbar spine: 1. Scoliosis and posterior spinal fixation rods limiting assessment. 2. New epidural abscess lower lumbar spine effacing thecal sac. 3. Suspected lower lumbar facet septic arthropathy. Probable paraspinal myositis and 2 cm abscess. 4. L5-S1 6 x 7 mm disc extrusion versus abscess. Equivocal L5-S1 discitis. 5. 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 These results will be called to the ordering clinician or representative by the Radiologist Assistant, and communication documented in the PACS or zVision Dashboard. Electronically Signed   By: Elon Alas M.D.   On: 12/05/2017 17:18   Mr Lumbar Spine W Wo  Contrast  Result Date: 12/05/2017 CLINICAL DATA:  Fevers, chills, endocarditis resulting and pulmonary abscess. History of polysubstance abuse and scoliosis. EXAM: MRI THORACIC AND LUMBAR SPINE WITHOUT AND WITH CONTRAST TECHNIQUE: Multiplanar and multiecho pulse sequences of the thoracic and lumbar spine were obtained without and with intravenous contrast. CONTRAST:  58m MULTIHANCE GADOBENATE DIMEGLUMINE 529 MG/ML IV SOLN COMPARISON:  MRI of the thoracic and lumbar spine November 21, 2017. FINDINGS: MRI THORACIC SPINE FINDINGS-spinal hardware resulting in extensive susceptibility artifact. ALIGNMENT: Straightened of the thoracic kyphosis. No malalignment. Scoliosis on this nonweightbearing examination. VERTEBRAE/DISCS: T3 through lumbar Harrington rods with laminar hooks. Vertebral bodies are intact. Intervertebral discs morphology and signal are normal though limited assessment. No abnormal bone marrow signal though limited STIR sequence. Vertebral bodies predominance cured on post gadolinium T1 sequence. CORD: Thoracic spinal cord is normal morphology and signal characteristics to the level of the conus medullaris which terminates at T12-L1. PREVERTEBRAL AND PARASPINAL SOFT TISSUES: Mild paraspinal muscle atrophy with no suspicious signal. Small RIGHT pleural effusion and multifocal consolidation. DISC LEVELS: No disc bulge, canal stenosis of upper thoracic spine to T3-4, below levels predominant obscured. MRI LUMBAR SPINE FINDINGS--spinal hardware resulting in extensive susceptibility artifact. SEGMENTATION: For the purposes of this report, the last well-formed intervertebral disc is reported as L5-S1. ALIGNMENT: Maintained lumbar lordosis. No malalignment. Thoracic through L3 Harrington rods with laminar hooks. VERTEBRAE:Vertebral bodies are intact. Moderate L4-5 and L5-S1 disc height loss with increasing disc edema, no enhancement. No suspicious vertebral body enhancement. Abnormal signal enhancement LEFT  sacrum. CONUS MEDULLARIS AND CAUDA EQUINA: Conus medullaris obscured. Thecal sac effacement due to epidural collection with rim enhancement measuring to 17 x 9 mm (transverse by AP) at L4-5. Thecal sac is to 4 mm in AP dimension at L4-5. PARASPINAL AND OTHER SOFT TISSUES: Bright STIR signal in suspected enhancement paraspinal muscles lower lumbar spine. 2 cm probable fluid collection LEFT paraspinal muscles at L4-5. Abnormal signal enhancement L4-5 and possibly L5-S1 facets. DISC LEVELS: L1-2 and L2-3: Obscured.  L3-4: Thecal sac effacement due to epidural collection. Severe facet arthropathy with abnormal enhancement. No osseous canal stenosis or neural foraminal narrowing. L4-5: Small broad-based disc bulge asymmetric to the LEFT. Thecal sac effacement due to epidural collection. Moderate facet arthropathy and ligamentum flavum redundancy. Mild neural foraminal narrowing. L5-S1: 6 x 7 mm RIGHT central disc extrusion versus abscess displacing the traversing RIGHT S1 nerve. Annular fissure versus annular enhancement from infection. Moderate RIGHT and severe LEFT facet arthropathy without canal stenosis. Moderate RIGHT, moderate to severe LEFT neural foraminal narrowing. IMPRESSION: MRI thoracic spine: 1. Scoliosis and posterior spinal fixation rods limiting assessment. 2. Stable appearance without imaging evidence of acute osseous process or infection in the spine. 3. Bilateral lung consolidation consistent with history of septic emboli. Small RIGHT pleural effusion. MRI lumbar spine: 1. Scoliosis and posterior spinal fixation rods limiting assessment. 2. New epidural abscess lower lumbar spine effacing thecal sac. 3. Suspected lower lumbar facet septic arthropathy. Probable paraspinal myositis and 2 cm abscess. 4. L5-S1 6 x 7 mm disc extrusion versus abscess. Equivocal L5-S1 discitis. 5. 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 These results will be called to the ordering clinician or representative by  the Radiologist Assistant, and communication documented in the PACS or zVision Dashboard. Electronically Signed   By: Elon Alas M.D.   On: 12/05/2017 17:18   Dg Lumbar Spine 1 View  Result Date: 12/05/2017 CLINICAL DATA:  Lumbar laminectomy.  Epidural abscess. EXAM: LUMBAR SPINE - 1 VIEW COMPARISON:  MRI lumbar spine 12/05/2017 FINDINGS: Cross-table lateral intraoperative examination is obtained demonstrating surgical retractors posterior to the lumbar spine and a localization marker projected over the soft tissues posterior to the L4-5 lumbar interspace. Rod and screw fixations are demonstrated extending from the thoracic spine down to the level of L3. Alignment of the lumbar spine appears intact. IMPRESSION: Intraoperative localization of L4-5. Electronically Signed   By: Lucienne Capers M.D.   On: 12/05/2017 22:50     Medications:  Scheduled: . acetaminophen  1,000 mg Oral Q6H  . bupivacaine liposome  20 mL Infiltration Once  . bupivacaine-EPINEPHrine  50 mL Infiltration Once  . cloNIDine  0.1 mg Oral BH-qamhs   Followed by  . [START ON 12/07/2017] cloNIDine  0.1 mg Oral QAC breakfast  . docusate sodium  100 mg Oral BID  . guaiFENesin  1,200 mg Oral BID  . heparin  5,000 Units Subcutaneous Q8H  . lactobacillus  1 g Oral TID WC  . mouth rinse  15 mL Mouth Rinse BID  . methadone  40 mg Oral Daily  . nicotine  21 mg Transdermal Daily  . sodium chloride flush  3 mL Intravenous Q12H  . sodium chloride flush  3 mL Intravenous Q12H  . sodium chloride flush  3 mL Intravenous Q12H   Continuous: . sodium chloride    . sodium chloride 100 mL/hr at 12/05/17 0800  . sodium chloride    . cefTRIAXone (ROCEPHIN)  IV 2 g (12/05/17 2345)  . lactated ringers    . vancomycin 1,000 mg (12/06/17 0539)   EVO:[id number] chloride, acetaminophen **OR** acetaminophen, bisacodyl, cyclobenzaprine, dicyclomine, HYDROmorphone (DILAUDID) injection, hydrOXYzine, ketorolac, lip balm, loperamide,  menthol-cetylpyridinium **OR** phenol, methocarbamol, morphine injection, naproxen, ondansetron **OR** ondansetron (ZOFRAN) IV, ondansetron, oxyCODONE, sodium chloride flush, sodium chloride flush, traZODone, zolpidem  Assessment/Plan:    MSSA bacteremia with tricuspid valve endocarditis/Sepsis This is most likely secondary to IV drug use.  Patient was on cefazolin which was changed over to vancomycin and ceftriaxone by neurosurgery.  Infectious disease is following.  Will defer antibiotics to ID.  RPR nonreactive.  HIV nonreactive.  Hepatitis C RNA by PCR negative.  Hepatitis A and B neg. Echocardiogram suggests severe tricuspid regurgitation.  Will consult cardiology to assist with further evaluation.  Patient might need a TEE.  Lumbar epidural abscess Most likely secondary to above.  Patient underwent lumbar laminectomy and drainage of abscess yesterday.  Pain control.  Neurosurgery is following.  History of scoliosis status post previous surgery/chronic back pain Patient underwent MRI which showed epidural abscess as discussed above.  Patient tells me that she takes methadone prescribed by an outpatient clinic.  I did pull up her record using the [facility] prescriber database.  No methadone prescription was found recently.  Patient was started on methadone a few days ago which will be continued for now.  [facility] in [place] was called.  Apparently patient was being given 80 mg of methadone at the clinic (no prescription) starting in April and last dose was on October 25, 2017.  But patient has not returned to the clinic since that and so she has been discharged.  And since no prescription was written, this information apparently does not show up on the [place] [facility] prescribers database.  Mild acute kidney injury Resolved.  Polysubstance use  Patient's urine drug screen on 11/21/17 was positive for cocaine opiates amphetamines barbiturates and benzodiazepine.  Patient was seen at the  [name] clinic up until October 25, 2017.  She was getting methadone 80 mg at the clinic on a daily basis.  No urine drug screen done in this hospital stay yet.  We will order one.  She was placed on clonidine protocol for withdrawal symptoms.  DVT Prophylaxis: Subcutaneous heparin    Code Status: Full code Family Communication: Discussed with the patient and her father who was at the bedside Disposition Plan: Management as outlined above.  Await specialty input.    LOS: 4 days   [facility] Pager [phone] 12/06/2017, 9:50 AM  If 7PM-7AM, please contact night-coverage at [email or web address], password [facility]

## 2017-12-06 NOTE — Progress Notes (Signed)
1900: Handoff report received from RN. Pt resting in bed; reports uncontrolled pain throughout the day. Discussed plan of care for the shift; pt amenable to plan.  Pt reporting generalized pain, back pain, and chest pain. She reports the chest pain as r/t her cardiac dx, same in location, type, and severity as it always has been.  2300: Pt continues with poorly controlled pain. Handoff report given to oncoming RN. No acute events during assignment.

## 2017-12-06 NOTE — Progress Notes (Signed)
Subjective: The patient complains of back pain.  Her parents are at the bedside.  Objective: Vital signs in last 24 hours: Temp:  [97.5 F (36.4 C)-99.5 F (37.5 C)] 99.2 F (37.3 C) (07/26 0400) Pulse Rate:  [82-100] 91 (07/26 0545) Resp:  [0-33] 0 (07/26 0545) BP: (101-144)/(57-114) 126/88 (07/26 0400) SpO2:  [92 %-100 %] 92 % (07/26 0545) Weight:  [77.1 kg (170 lb)-89.9 kg (198 lb 3.1 oz)] 89.9 kg (198 lb 3.1 oz) (07/25 2229) Estimated body mass index is 31.04 kg/m as calculated from the following:   Height as of this encounter: 5' 7"  (1.702 m).   Weight as of this encounter: 89.9 kg (198 lb 3.1 oz).   Intake/Output from previous day: 07/25 0701 - 07/26 0700 In: 2340 [P.O.:240; I.V.:2100] Out: 9892 [Urine:3275; Blood:100] Intake/Output this shift: Total I/O In: 1500 [I.V.:1500] Out: 850 [Urine:750; Blood:100]  Physical exam the patient is alert and pleasant.  Her bilateral gastrocnemius strength is 4/5, her right dorsiflexors are 4/5 and 4-/5 on the left, i.e. improved.  Lab Results: Recent Labs    12/04/17 0538 12/05/17 0324  WBC 14.9* 11.1*  HGB 8.7* 7.2*  HCT 27.1* 23.1*  PLT 444* 419*   BMET Recent Labs    12/04/17 0538 12/05/17 0324  NA 136 138  K 4.0 4.1  CL 101 106  CO2 26 24  GLUCOSE 135* 100*  BUN 12 11  CREATININE 0.68 0.57  CALCIUM 8.9 8.7*    Studies/Results: Mr Thoracic Spine W Wo Contrast  Result Date: 12/05/2017 CLINICAL DATA:  Fevers, chills, endocarditis resulting and pulmonary abscess. History of polysubstance abuse and scoliosis. EXAM: MRI THORACIC AND LUMBAR SPINE WITHOUT AND WITH CONTRAST TECHNIQUE: Multiplanar and multiecho pulse sequences of the thoracic and lumbar spine were obtained without and with intravenous contrast. CONTRAST:  25m MULTIHANCE GADOBENATE DIMEGLUMINE 529 MG/ML IV SOLN COMPARISON:  MRI of the thoracic and lumbar spine November 21, 2017. FINDINGS: MRI THORACIC SPINE FINDINGS-spinal hardware resulting in extensive  susceptibility artifact. ALIGNMENT: Straightened of the thoracic kyphosis. No malalignment. Scoliosis on this nonweightbearing examination. VERTEBRAE/DISCS: T3 through lumbar Harrington rods with laminar hooks. Vertebral bodies are intact. Intervertebral discs morphology and signal are normal though limited assessment. No abnormal bone marrow signal though limited STIR sequence. Vertebral bodies predominance cured on post gadolinium T1 sequence. CORD: Thoracic spinal cord is normal morphology and signal characteristics to the level of the conus medullaris which terminates at T12-L1. PREVERTEBRAL AND PARASPINAL SOFT TISSUES: Mild paraspinal muscle atrophy with no suspicious signal. Small RIGHT pleural effusion and multifocal consolidation. DISC LEVELS: No disc bulge, canal stenosis of upper thoracic spine to T3-4, below levels predominant obscured. MRI LUMBAR SPINE FINDINGS--spinal hardware resulting in extensive susceptibility artifact. SEGMENTATION: For the purposes of this report, the last well-formed intervertebral disc is reported as L5-S1. ALIGNMENT: Maintained lumbar lordosis. No malalignment. Thoracic through L3 Harrington rods with laminar hooks. VERTEBRAE:Vertebral bodies are intact. Moderate L4-5 and L5-S1 disc height loss with increasing disc edema, no enhancement. No suspicious vertebral body enhancement. Abnormal signal enhancement LEFT sacrum. CONUS MEDULLARIS AND CAUDA EQUINA: Conus medullaris obscured. Thecal sac effacement due to epidural collection with rim enhancement measuring to 17 x 9 mm (transverse by AP) at L4-5. Thecal sac is to 4 mm in AP dimension at L4-5. PARASPINAL AND OTHER SOFT TISSUES: Bright STIR signal in suspected enhancement paraspinal muscles lower lumbar spine. 2 cm probable fluid collection LEFT paraspinal muscles at L4-5. Abnormal signal enhancement L4-5 and possibly L5-S1 facets. DISC LEVELS: L1-2 and  L2-3: Obscured. L3-4: Thecal sac effacement due to epidural collection.  Severe facet arthropathy with abnormal enhancement. No osseous canal stenosis or neural foraminal narrowing. L4-5: Small broad-based disc bulge asymmetric to the LEFT. Thecal sac effacement due to epidural collection. Moderate facet arthropathy and ligamentum flavum redundancy. Mild neural foraminal narrowing. L5-S1: 6 x 7 mm RIGHT central disc extrusion versus abscess displacing the traversing RIGHT S1 nerve. Annular fissure versus annular enhancement from infection. Moderate RIGHT and severe LEFT facet arthropathy without canal stenosis. Moderate RIGHT, moderate to severe LEFT neural foraminal narrowing. IMPRESSION: MRI thoracic spine: 1. Scoliosis and posterior spinal fixation rods limiting assessment. 2. Stable appearance without imaging evidence of acute osseous process or infection in the spine. 3. Bilateral lung consolidation consistent with history of septic emboli. Small RIGHT pleural effusion. MRI lumbar spine: 1. Scoliosis and posterior spinal fixation rods limiting assessment. 2. New epidural abscess lower lumbar spine effacing thecal sac. 3. Suspected lower lumbar facet septic arthropathy. Probable paraspinal myositis and 2 cm abscess. 4. L5-S1 6 x 7 mm disc extrusion versus abscess. Equivocal L5-S1 discitis. 5. 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 These results will be called to the ordering clinician or representative by the Radiologist Assistant, and communication documented in the PACS or zVision Dashboard. Electronically Signed   By: Elon Alas M.D.   On: 12/05/2017 17:18   Mr Lumbar Spine W Wo Contrast  Result Date: 12/05/2017 CLINICAL DATA:  Fevers, chills, endocarditis resulting and pulmonary abscess. History of polysubstance abuse and scoliosis. EXAM: MRI THORACIC AND LUMBAR SPINE WITHOUT AND WITH CONTRAST TECHNIQUE: Multiplanar and multiecho pulse sequences of the thoracic and lumbar spine were obtained without and with intravenous contrast. CONTRAST:  25m  MULTIHANCE GADOBENATE DIMEGLUMINE 529 MG/ML IV SOLN COMPARISON:  MRI of the thoracic and lumbar spine November 21, 2017. FINDINGS: MRI THORACIC SPINE FINDINGS-spinal hardware resulting in extensive susceptibility artifact. ALIGNMENT: Straightened of the thoracic kyphosis. No malalignment. Scoliosis on this nonweightbearing examination. VERTEBRAE/DISCS: T3 through lumbar Harrington rods with laminar hooks. Vertebral bodies are intact. Intervertebral discs morphology and signal are normal though limited assessment. No abnormal bone marrow signal though limited STIR sequence. Vertebral bodies predominance cured on post gadolinium T1 sequence. CORD: Thoracic spinal cord is normal morphology and signal characteristics to the level of the conus medullaris which terminates at T12-L1. PREVERTEBRAL AND PARASPINAL SOFT TISSUES: Mild paraspinal muscle atrophy with no suspicious signal. Small RIGHT pleural effusion and multifocal consolidation. DISC LEVELS: No disc bulge, canal stenosis of upper thoracic spine to T3-4, below levels predominant obscured. MRI LUMBAR SPINE FINDINGS--spinal hardware resulting in extensive susceptibility artifact. SEGMENTATION: For the purposes of this report, the last well-formed intervertebral disc is reported as L5-S1. ALIGNMENT: Maintained lumbar lordosis. No malalignment. Thoracic through L3 Harrington rods with laminar hooks. VERTEBRAE:Vertebral bodies are intact. Moderate L4-5 and L5-S1 disc height loss with increasing disc edema, no enhancement. No suspicious vertebral body enhancement. Abnormal signal enhancement LEFT sacrum. CONUS MEDULLARIS AND CAUDA EQUINA: Conus medullaris obscured. Thecal sac effacement due to epidural collection with rim enhancement measuring to 17 x 9 mm (transverse by AP) at L4-5. Thecal sac is to 4 mm in AP dimension at L4-5. PARASPINAL AND OTHER SOFT TISSUES: Bright STIR signal in suspected enhancement paraspinal muscles lower lumbar spine. 2 cm probable fluid  collection LEFT paraspinal muscles at L4-5. Abnormal signal enhancement L4-5 and possibly L5-S1 facets. DISC LEVELS: L1-2 and L2-3: Obscured. L3-4: Thecal sac effacement due to epidural collection. Severe facet arthropathy with abnormal enhancement. No osseous canal stenosis  or neural foraminal narrowing. L4-5: Small broad-based disc bulge asymmetric to the LEFT. Thecal sac effacement due to epidural collection. Moderate facet arthropathy and ligamentum flavum redundancy. Mild neural foraminal narrowing. L5-S1: 6 x 7 mm RIGHT central disc extrusion versus abscess displacing the traversing RIGHT S1 nerve. Annular fissure versus annular enhancement from infection. Moderate RIGHT and severe LEFT facet arthropathy without canal stenosis. Moderate RIGHT, moderate to severe LEFT neural foraminal narrowing. IMPRESSION: MRI thoracic spine: 1. Scoliosis and posterior spinal fixation rods limiting assessment. 2. Stable appearance without imaging evidence of acute osseous process or infection in the spine. 3. Bilateral lung consolidation consistent with history of septic emboli. Small RIGHT pleural effusion. MRI lumbar spine: 1. Scoliosis and posterior spinal fixation rods limiting assessment. 2. New epidural abscess lower lumbar spine effacing thecal sac. 3. Suspected lower lumbar facet septic arthropathy. Probable paraspinal myositis and 2 cm abscess. 4. L5-S1 6 x 7 mm disc extrusion versus abscess. Equivocal L5-S1 discitis. 5. 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 These results will be called to the ordering clinician or representative by the Radiologist Assistant, and communication documented in the PACS or zVision Dashboard. Electronically Signed   By: Elon Alas M.D.   On: 12/05/2017 17:18   Dg Lumbar Spine 1 View  Result Date: 12/05/2017 CLINICAL DATA:  Lumbar laminectomy.  Epidural abscess. EXAM: LUMBAR SPINE - 1 VIEW COMPARISON:  MRI lumbar spine 12/05/2017 FINDINGS: Cross-table lateral  intraoperative examination is obtained demonstrating surgical retractors posterior to the lumbar spine and a localization marker projected over the soft tissues posterior to the L4-5 lumbar interspace. Rod and screw fixations are demonstrated extending from the thoracic spine down to the level of L3. Alignment of the lumbar spine appears intact. IMPRESSION: Intraoperative localization of L4-5. Electronically Signed   By: Lucienne Capers M.D.   On: 12/05/2017 22:50    Assessment/Plan: Postop day #1: Patient is stronger today.  We will await the cultures and continue empiric antibiotics.  I have answered all their questions.  LOS: 4 days     Kelly [name] 12/06/2017, 6:56 AM

## 2017-12-06 NOTE — Anesthesia Postprocedure Evaluation (Signed)
Anesthesia Post Note  Patient: Kelly [name]  Procedure(s) Performed: LUMBAR LAMINECTOMY FOR EPIDURAL ABSCESS (N/A Spine Lumbar)     Patient location during evaluation: PACU Anesthesia Type: General Level of consciousness: awake and alert Pain management: pain level controlled Vital Signs Assessment: post-procedure vital signs reviewed and stable Respiratory status: spontaneous breathing, nonlabored ventilation, respiratory function stable and patient connected to nasal cannula oxygen Cardiovascular status: blood pressure returned to baseline and stable Postop Assessment: no apparent nausea or vomiting Anesthetic complications: no    Last Vitals:  Vitals:   12/06/17 0537 12/06/17 0545  BP:    Pulse: 90 91  Resp: (!) 33 (!) 0  Temp:    SpO2: 99% 92%    Last Pain:  Vitals:   12/06/17 0545  TempSrc:   PainSc: Asleep                 Alver Leete DAVID

## 2017-12-07 ENCOUNTER — Inpatient Hospital Stay: Payer: Self-pay

## 2017-12-07 DIAGNOSIS — R601 Generalized edema: Secondary | ICD-10-CM

## 2017-12-07 LAB — BASIC METABOLIC PANEL
Anion gap: 8 (ref 5–15)
BUN: 5 mg/dL — ABNORMAL LOW (ref 6–20)
CO2: 22 mmol/L (ref 22–32)
Calcium: 8.3 mg/dL — ABNORMAL LOW (ref 8.9–10.3)
Chloride: 103 mmol/L (ref 98–111)
Creatinine, Ser: 0.57 mg/dL (ref 0.44–1.00)
Glucose, Bld: 108 mg/dL — ABNORMAL HIGH (ref 70–99)
POTASSIUM: 4.6 mmol/L (ref 3.5–5.1)
SODIUM: 133 mmol/L — AB (ref 135–145)

## 2017-12-07 LAB — CBC
HCT: 23.6 % — ABNORMAL LOW (ref 36.0–46.0)
HEMOGLOBIN: 7.5 g/dL — AB (ref 12.0–15.0)
MCH: 28.6 pg (ref 26.0–34.0)
MCHC: 31.8 g/dL (ref 30.0–36.0)
MCV: 90.1 fL (ref 78.0–100.0)
Platelets: 631 10*3/uL — ABNORMAL HIGH (ref 150–400)
RBC: 2.62 MIL/uL — ABNORMAL LOW (ref 3.87–5.11)
RDW: 13.9 % (ref 11.5–15.5)
WBC: 14.3 10*3/uL — ABNORMAL HIGH (ref 4.0–10.5)

## 2017-12-07 LAB — MAGNESIUM: MAGNESIUM: 1.5 mg/dL — AB (ref 1.7–2.4)

## 2017-12-07 MED ORDER — SODIUM CHLORIDE 0.9% FLUSH
10.0000 mL | Freq: Two times a day (BID) | INTRAVENOUS | Status: DC
  Administered 2017-12-08 – 2017-12-09 (×2): 10 mL

## 2017-12-07 MED ORDER — SODIUM CHLORIDE 0.9% FLUSH: 10.0000 mL | INTRAVENOUS | Status: DC | PRN

## 2017-12-07 MED ORDER — MAGNESIUM SULFATE 2 GM/50ML IV SOLN
2.0000 g | Freq: Once | INTRAVENOUS | Status: AC
  Administered 2017-12-07: 2 g via INTRAVENOUS
  Filled 2017-12-07: qty 50

## 2017-12-07 NOTE — Progress Notes (Deleted)
Peripherally Inserted Central Catheter/Midline Placement  The IV Nurse has discussed with the patient and/or persons authorized to consent for the patient, the purpose of this procedure and the potential benefits and risks involved with this procedure.  The benefits include less needle sticks, lab draws from the catheter, and the patient may be discharged home with the catheter. Risks include, but not limited to, infection, bleeding, blood clot (thrombus formation), and puncture of an artery; nerve damage and irregular heartbeat and possibility to perform a PICC exchange if needed/ordered by physician.  Alternatives to this procedure were also discussed.  Bard Power PICC patient education guide, fact sheet on infection prevention and patient information card has been provided to patient /or left at bedside.    PICC/Midline Placement Documentation  PICC Double Lumen 12/07/17 PICC Right Brachial 36 cm 0 cm (Active)  Indication for Insertion or Continuance of Line Administration of hyperosmolar/irritating solutions (i.e. TPN, Vancomycin, etc.) 12/07/2017  3:44 PM  Exposed Catheter (cm) 0 cm 12/07/2017  3:44 PM  Site Assessment Clean;Dry;Intact 12/07/2017  3:44 PM  Lumen #1 Status Flushed;Blood return noted 12/07/2017  3:44 PM  Lumen #2 Status Flushed;Blood return noted 12/07/2017  3:44 PM  Dressing Type Transparent 12/07/2017  3:44 PM  Dressing Status Clean;Dry;Intact;Antimicrobial disc in place 12/07/2017  3:44 PM  Dressing Intervention New dressing 12/07/2017  3:44 PM  Dressing Change Due 12/14/17 12/07/2017  3:44 PM   Consent signed by son   Synthia Innocent 12/07/2017, 3:45 PM

## 2017-12-07 NOTE — Plan of Care (Signed)
Patient very pleasant, in pain however tolerating well. She has not had much of an appetite d/t the pain, she is trying to eat small portions.

## 2017-12-07 NOTE — Progress Notes (Signed)
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  Patient also complained of back pain.  MRI of the lumbar spine suggested epidural abscess.  Patient was transferred to [facility] and underwent lumbar laminectomy for drainage of the epidural abscess.  Reason for Visit: MSSA bacteremia.  Epidural abscess.  Endocarditis.  Consultants: Infectious disease.  Neurosurgery.  Cardiology  Procedures:   L3, L4, L5 and S1 laminectomy for drainage of lumbar epidural abscess on 7/25  Transthoracic echocardiogram Study Conclusions  - Left ventricle: The cavity size was normal. Systolic function was   normal. The estimated ejection fraction was in the range of 55%   to 60%. Wall motion was normal; there were no regional wall   motion abnormalities. Left ventricular diastolic function   parameters were normal. - Aortic valve: Transvalvular velocity was within the normal range.   There was no stenosis. There was no regurgitation. - Mitral valve: Thickening of the anterior leaflet. Transvalvular   velocity was within the normal range. There was no evidence for   stenosis. There was trivial regurgitation. - Left atrium: The atrium was mildly dilated. - Right ventricle: The cavity size was normal. Wall thickness was   normal. Systolic function was normal. - Right atrium: The atrium was severely dilated. - Tricuspid valve: There was a medium-sized, 1.0 cm (W) x 1.7 cm   (L) vegetation on the septal leaflet. There was severe   regurgitation. - Pulmonary arteries: Systolic pressure was  mildly increased. PA   peak pressure: 42 mm Hg (S).  Impressions:  - Vegetation noted on the septal leaflet of the tricuspid valve   consistent with endocarditis. Cannot exlude vegetation on the   anterior leaflet of the mitral valve. Suggest TEE to better   assess.  Antibiotics: She was on cefazolin  Started on vancomycin and ceftriaxone by neurosurgery  Subjective/Interval History: Patient had a pretty rough night.  Severe back pain was present.  Able to move her lower extremities.  Denies any nausea vomiting.  Did have some chest discomfort.  Shortness of breath and greenish expectoration was coughed up.    ROS: Denies any headaches.  Does mention that her arms and legs appear to be swollen.  Objective:  Vital Signs  Vitals:   12/07/17 0206 12/07/17 0300 12/07/17 0400 12/07/17 0804  BP:  (!) 126/92  (!) 117/104  Pulse: 96 89 89 80  Resp: 16 20 (!) 30 17  Temp:  99.6 F (37.6 C)  98.7 F (37.1 C)  TempSrc:  Oral  Oral  SpO2: 97% 95% 96% 98%  Weight:      Height:        Intake/Output Summary (Last 24 hours) at 12/07/2017 0837 Last data filed at 12/07/2017 0700 Gross per 24 hour  Intake 4211.75 ml  Output 745 ml  Net 3466.75 ml   Filed Weights   12/02/17 1135 12/05/17 1128 12/05/17 2229  Weight: 72.6 kg (160 lb) 77.1 kg (170 lb) 89.9 kg (198 lb 3.1 oz)    General appearance: Awake alert.  In no distress Resp: Diminished air entry at the bases.  Coarse breath sounds.  Few crackles.  No wheezing or rhonchi.  Normal effort at rest. Cardio: S1-S2 is normal regular.  No S3-S4.  No rubs murmurs or bruit GI: Abdomen is soft.  Nontender nondistended.  Bowel sounds normal.  No masses organomegaly. Extremities: Edema noted bilateral upper and lower extremities Back: Drain is noted. Neurologic: No obvious focal neurological deficits.  Lab Results:  Data Reviewed: I have personally reviewed following labs and imaging studies  CBC: Recent Labs  Lab 12/03/17 0327  12/04/17 0538 12/05/17 0324 12/06/17 0632 12/07/17 0612  WBC 21.9* 14.9* 11.1* 11.8* 14.3*  HGB 9.6* 8.7* 7.2* 7.2* 7.5*  HCT 29.2* 27.1* 23.1* 23.8* 23.6*  MCV 88.0 90.3 92.0 91.9 90.1  PLT 358 444* 419* 414* 631*    Basic Metabolic Panel: Recent Labs  Lab 12/03/17 0327 12/04/17 0538 12/05/17 0324 12/06/17 0632 12/07/17 0612  NA 133* 136 138 135 133*  K 4.2 4.0 4.1 4.5 4.6  CL 98 101 106 103 103  CO2 27 26 24 22 22   GLUCOSE 109* 135* 100* 108* 108*  BUN 17 12 11 6  5*  CREATININE 0.88 0.68 0.57 0.60 0.57  CALCIUM 8.5* 8.9 8.7* 8.2* 8.3*  MG  --   --   --  1.3* 1.5*    GFR: Estimated Creatinine Clearance: 115.1 mL/min (by C-G formula based on SCr of 0.57 mg/dL).  Liver Function Tests: Recent Labs  Lab 12/02/17 1320 12/03/17 0327  AST 13* 14*  ALT 6 6  ALKPHOS 168* 211*  BILITOT 1.0 0.8  PROT 7.2 6.6  ALBUMIN 2.4* 2.0*      Recent Results (from the past 240 hour(s))  Blood culture (routine x 2)     Status: Abnormal   Collection Time: 12/02/17  2:55 PM  Result Value Ref Range Status   Specimen Description   Final    BLOOD LEFT FOOT Performed at [facility] [address] [place], [place] [address]    Special Requests   Final    BOTTLES DRAWN AEROBIC AND ANAEROBIC Blood Culture results may not be optimal due to an excessive volume of blood received in culture bottles Performed at [facility] [address], [place], [place] [address]    Culture  Setup Time   Final    IN BOTH AEROBIC AND ANAEROBIC BOTTLES GRAM POSITIVE COCCI CRITICAL RESULT CALLED TO, READ BACK BY AND VERIFIED WITH: B GREEN PHARMD 12/03/17 0532 JDW Performed at [facility], [place] [address] [place], [place] [address]    Culture STAPHYLOCOCCUS AUREUS (A)  Final   Report Status 12/05/2017 FINAL  Final   Organism ID, Bacteria STAPHYLOCOCCUS AUREUS  Final      Susceptibility   Staphylococcus aureus - MIC*    CIPROFLOXACIN <=0.5 SENSITIVE  Sensitive     ERYTHROMYCIN >=8 RESISTANT Resistant     GENTAMICIN <=0.5 SENSITIVE Sensitive     OXACILLIN <=0.25 SENSITIVE Sensitive     TETRACYCLINE 8 INTERMEDIATE Intermediate     VANCOMYCIN <=0.5 SENSITIVE Sensitive     TRIMETH/SULFA <=10 SENSITIVE Sensitive     CLINDAMYCIN <=0.25 SENSITIVE Sensitive     RIFAMPIN <=0.5 SENSITIVE Sensitive     Inducible Clindamycin NEGATIVE Sensitive     * STAPHYLOCOCCUS AUREUS  Blood Culture ID Panel (Reflexed)     Status: Abnormal   Collection Time: 12/02/17  2:55 PM  Result Value Ref Range Status   Enterococcus species NOT DETECTED NOT DETECTED Final   Listeria monocytogenes NOT DETECTED NOT DETECTED Final   Staphylococcus  species DETECTED (A) NOT DETECTED Final    Comment: CRITICAL RESULT CALLED TO, READ BACK BY AND VERIFIED WITH: B GREEN PHARMD 12/03/17 0532 JDW    Staphylococcus aureus DETECTED (A) NOT DETECTED Final    Comment: Methicillin (oxacillin) susceptible Staphylococcus aureus (MSSA). Preferred therapy is anti staphylococcal beta lactam antibiotic (Cefazolin or Nafcillin), unless clinically contraindicated. CRITICAL RESULT CALLED TO, READ BACK BY AND VERIFIED WITH: B GREEN PHARMD 12/03/17 0532 JDW    Methicillin resistance NOT DETECTED NOT DETECTED Final   Streptococcus species NOT DETECTED NOT DETECTED Final   Streptococcus agalactiae NOT DETECTED NOT DETECTED Final   Streptococcus pneumoniae NOT DETECTED NOT DETECTED Final   Streptococcus pyogenes NOT DETECTED NOT DETECTED Final   Acinetobacter baumannii NOT DETECTED NOT DETECTED Final   Enterobacteriaceae species NOT DETECTED NOT DETECTED Final   Enterobacter cloacae complex NOT DETECTED NOT DETECTED Final   Escherichia coli NOT DETECTED NOT DETECTED Final   Klebsiella oxytoca NOT DETECTED NOT DETECTED Final   Klebsiella pneumoniae NOT DETECTED NOT DETECTED Final   Proteus species NOT DETECTED NOT DETECTED Final   Serratia marcescens NOT DETECTED NOT DETECTED Final    Haemophilus influenzae NOT DETECTED NOT DETECTED Final   Neisseria meningitidis NOT DETECTED NOT DETECTED Final   Pseudomonas aeruginosa NOT DETECTED NOT DETECTED Final   Candida albicans NOT DETECTED NOT DETECTED Final   Candida glabrata NOT DETECTED NOT DETECTED Final   Candida krusei NOT DETECTED NOT DETECTED Final   Candida parapsilosis NOT DETECTED NOT DETECTED Final   Candida tropicalis NOT DETECTED NOT DETECTED Final    Comment: Performed at [facility], [place]. [address] [place], [place] [address]  Blood culture (routine x 2)     Status: Abnormal   Collection Time: 12/02/17  3:57 PM  Result Value Ref Range Status   Specimen Description   Final    BLOOD RIGHT ARM Performed at [facility] [address] [place], [place] [address]    Special Requests   Final    BOTTLES DRAWN AEROBIC AND ANAEROBIC Blood Culture adequate volume Performed at [facility] [address], [place], [place] [address]    Culture  Setup Time   Final    IN BOTH AEROBIC AND ANAEROBIC BOTTLES GRAM POSITIVE COCCI CRITICAL VALUE NOTED.  VALUE IS CONSISTENT WITH PREVIOUSLY REPORTED AND CALLED VALUE.    Culture (A)  Final    STAPHYLOCOCCUS AUREUS SUSCEPTIBILITIES PERFORMED ON PREVIOUS CULTURE WITHIN THE LAST 5 DAYS. Performed at [facility], [place] [address], [place], [place] [address]    Report Status 12/05/2017 FINAL  Final  MRSA PCR Screening     Status: None   Collection Time: 12/02/17  6:20 PM  Result Value Ref Range Status   MRSA by PCR NEGATIVE NEGATIVE Final    Comment:        The GeneXpert MRSA Assay (FDA approved for NASAL specimens only), is one component of a comprehensive MRSA colonization surveillance program. It is not intended to diagnose MRSA infection nor to guide or monitor treatment for MRSA infections. Performed at [facility], [place] [address], [place], [place] [address]   Culture, blood (routine x 2)      Status: None (Preliminary result)   Collection Time: 12/04/17  8:03 AM  Result Value Ref Range Status   Specimen Description   Final    BLOOD LEFT ANTECUBITAL Performed at [facility] [address] [place], [place] [address]  Special Requests   Final    BOTTLES DRAWN AEROBIC ONLY Blood Culture adequate volume Performed at [facility] [address] [place], [place] [address]    Culture   Final    NO GROWTH 2 DAYS Performed at [facility] [address] [place], [place] [address]    Report Status PENDING  Incomplete  Culture, blood (routine x 2)     Status: None (Preliminary result)   Collection Time: 12/04/17  8:13 AM  Result Value Ref Range Status   Specimen Description   Final    BLOOD LEFT HAND Performed at [facility] [address] [place], [place] [address]    Special Requests   Final    BOTTLES DRAWN AEROBIC ONLY Blood Culture adequate volume Performed at [facility] [address] [place], [place] [address]    Culture   Final    NO GROWTH 2 DAYS Performed at [facility] [address], [place], [place] [address]    Report Status PENDING  Incomplete  Aerobic/Anaerobic Culture (surgical/deep wound)     Status: None (Preliminary result)   Collection Time: 12/05/17  8:03 PM  Result Value Ref Range Status   Specimen Description ABSCESS  Final   Special Requests LUMBAR EPIDURAL  Final   Gram Stain   Final    ABUNDANT WBC PRESENT, PREDOMINANTLY PMN MODERATE GRAM POSITIVE COCCI    Culture   Final    CULTURE REINCUBATED FOR BETTER GROWTH Performed at [facility], [place] [address], [place], [place] [address]    Report Status PENDING  Incomplete      Radiology Studies: Mr Thoracic Spine W Wo Contrast  Result Date: 12/05/2017 CLINICAL DATA:  Fevers, chills, endocarditis resulting and pulmonary abscess. History of polysubstance abuse and scoliosis. EXAM: MRI  THORACIC AND LUMBAR SPINE WITHOUT AND WITH CONTRAST TECHNIQUE: Multiplanar and multiecho pulse sequences of the thoracic and lumbar spine were obtained without and with intravenous contrast. CONTRAST:  15m MULTIHANCE GADOBENATE DIMEGLUMINE 529 MG/ML IV SOLN COMPARISON:  MRI of the thoracic and lumbar spine November 21, 2017. FINDINGS: MRI THORACIC SPINE FINDINGS-spinal hardware resulting in extensive susceptibility artifact. ALIGNMENT: Straightened of the thoracic kyphosis. No malalignment. Scoliosis on this nonweightbearing examination. VERTEBRAE/DISCS: T3 through lumbar Harrington rods with laminar hooks. Vertebral bodies are intact. Intervertebral discs morphology and signal are normal though limited assessment. No abnormal bone marrow signal though limited STIR sequence. Vertebral bodies predominance cured on post gadolinium T1 sequence. CORD: Thoracic spinal cord is normal morphology and signal characteristics to the level of the conus medullaris which terminates at T12-L1. PREVERTEBRAL AND PARASPINAL SOFT TISSUES: Mild paraspinal muscle atrophy with no suspicious signal. Small RIGHT pleural effusion and multifocal consolidation. DISC LEVELS: No disc bulge, canal stenosis of upper thoracic spine to T3-4, below levels predominant obscured. MRI LUMBAR SPINE FINDINGS--spinal hardware resulting in extensive susceptibility artifact. SEGMENTATION: For the purposes of this report, the last well-formed intervertebral disc is reported as L5-S1. ALIGNMENT: Maintained lumbar lordosis. No malalignment. Thoracic through L3 Harrington rods with laminar hooks. VERTEBRAE:Vertebral bodies are intact. Moderate L4-5 and L5-S1 disc height loss with increasing disc edema, no enhancement. No suspicious vertebral body enhancement. Abnormal signal enhancement LEFT sacrum. CONUS MEDULLARIS AND CAUDA EQUINA: Conus medullaris obscured. Thecal sac effacement due to epidural collection with rim enhancement measuring to 17 x 9 mm (transverse  by AP) at L4-5. Thecal sac is to 4 mm in AP dimension at L4-5. PARASPINAL AND  OTHER SOFT TISSUES: Bright STIR signal in suspected enhancement paraspinal muscles lower lumbar spine. 2 cm probable fluid collection LEFT paraspinal muscles at L4-5. Abnormal signal enhancement L4-5 and possibly L5-S1 facets. DISC LEVELS: L1-2 and L2-3: Obscured. L3-4: Thecal sac effacement due to epidural collection. Severe facet arthropathy with abnormal enhancement. No osseous canal stenosis or neural foraminal narrowing. L4-5: Small broad-based disc bulge asymmetric to the LEFT. Thecal sac effacement due to epidural collection. Moderate facet arthropathy and ligamentum flavum redundancy. Mild neural foraminal narrowing. L5-S1: 6 x 7 mm RIGHT central disc extrusion versus abscess displacing the traversing RIGHT S1 nerve. Annular fissure versus annular enhancement from infection. Moderate RIGHT and severe LEFT facet arthropathy without canal stenosis. Moderate RIGHT, moderate to severe LEFT neural foraminal narrowing. IMPRESSION: MRI thoracic spine: 1. Scoliosis and posterior spinal fixation rods limiting assessment. 2. Stable appearance without imaging evidence of acute osseous process or infection in the spine. 3. Bilateral lung consolidation consistent with history of septic emboli. Small RIGHT pleural effusion. MRI lumbar spine: 1. Scoliosis and posterior spinal fixation rods limiting assessment. 2. New epidural abscess lower lumbar spine effacing thecal sac. 3. Suspected lower lumbar facet septic arthropathy. Probable paraspinal myositis and 2 cm abscess. 4. L5-S1 6 x 7 mm disc extrusion versus abscess. Equivocal L5-S1 discitis. 5. 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 These results will be called to the ordering clinician or representative by the Radiologist Assistant, and communication documented in the PACS or zVision Dashboard. Electronically Signed   By: Elon Alas M.D.   On: 12/05/2017 17:18   Mr  Lumbar Spine W Wo Contrast  Result Date: 12/05/2017 CLINICAL DATA:  Fevers, chills, endocarditis resulting and pulmonary abscess. History of polysubstance abuse and scoliosis. EXAM: MRI THORACIC AND LUMBAR SPINE WITHOUT AND WITH CONTRAST TECHNIQUE: Multiplanar and multiecho pulse sequences of the thoracic and lumbar spine were obtained without and with intravenous contrast. CONTRAST:  65m MULTIHANCE GADOBENATE DIMEGLUMINE 529 MG/ML IV SOLN COMPARISON:  MRI of the thoracic and lumbar spine November 21, 2017. FINDINGS: MRI THORACIC SPINE FINDINGS-spinal hardware resulting in extensive susceptibility artifact. ALIGNMENT: Straightened of the thoracic kyphosis. No malalignment. Scoliosis on this nonweightbearing examination. VERTEBRAE/DISCS: T3 through lumbar Harrington rods with laminar hooks. Vertebral bodies are intact. Intervertebral discs morphology and signal are normal though limited assessment. No abnormal bone marrow signal though limited STIR sequence. Vertebral bodies predominance cured on post gadolinium T1 sequence. CORD: Thoracic spinal cord is normal morphology and signal characteristics to the level of the conus medullaris which terminates at T12-L1. PREVERTEBRAL AND PARASPINAL SOFT TISSUES: Mild paraspinal muscle atrophy with no suspicious signal. Small RIGHT pleural effusion and multifocal consolidation. DISC LEVELS: No disc bulge, canal stenosis of upper thoracic spine to T3-4, below levels predominant obscured. MRI LUMBAR SPINE FINDINGS--spinal hardware resulting in extensive susceptibility artifact. SEGMENTATION: For the purposes of this report, the last well-formed intervertebral disc is reported as L5-S1. ALIGNMENT: Maintained lumbar lordosis. No malalignment. Thoracic through L3 Harrington rods with laminar hooks. VERTEBRAE:Vertebral bodies are intact. Moderate L4-5 and L5-S1 disc height loss with increasing disc edema, no enhancement. No suspicious vertebral body enhancement. Abnormal signal  enhancement LEFT sacrum. CONUS MEDULLARIS AND CAUDA EQUINA: Conus medullaris obscured. Thecal sac effacement due to epidural collection with rim enhancement measuring to 17 x 9 mm (transverse by AP) at L4-5. Thecal sac is to 4 mm in AP dimension at L4-5. PARASPINAL AND OTHER SOFT TISSUES: Bright STIR signal in suspected enhancement paraspinal muscles lower lumbar spine. 2 cm probable fluid collection LEFT paraspinal  muscles at L4-5. Abnormal signal enhancement L4-5 and possibly L5-S1 facets. DISC LEVELS: L1-2 and L2-3: Obscured. L3-4: Thecal sac effacement due to epidural collection. Severe facet arthropathy with abnormal enhancement. No osseous canal stenosis or neural foraminal narrowing. L4-5: Small broad-based disc bulge asymmetric to the LEFT. Thecal sac effacement due to epidural collection. Moderate facet arthropathy and ligamentum flavum redundancy. Mild neural foraminal narrowing. L5-S1: 6 x 7 mm RIGHT central disc extrusion versus abscess displacing the traversing RIGHT S1 nerve. Annular fissure versus annular enhancement from infection. Moderate RIGHT and severe LEFT facet arthropathy without canal stenosis. Moderate RIGHT, moderate to severe LEFT neural foraminal narrowing. IMPRESSION: MRI thoracic spine: 1. Scoliosis and posterior spinal fixation rods limiting assessment. 2. Stable appearance without imaging evidence of acute osseous process or infection in the spine. 3. Bilateral lung consolidation consistent with history of septic emboli. Small RIGHT pleural effusion. MRI lumbar spine: 1. Scoliosis and posterior spinal fixation rods limiting assessment. 2. New epidural abscess lower lumbar spine effacing thecal sac. 3. Suspected lower lumbar facet septic arthropathy. Probable paraspinal myositis and 2 cm abscess. 4. L5-S1 6 x 7 mm disc extrusion versus abscess. Equivocal L5-S1 discitis. 5. 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 These results will be called to the ordering clinician or  representative by the Radiologist Assistant, and communication documented in the PACS or zVision Dashboard. Electronically Signed   By: Elon Alas M.D.   On: 12/05/2017 17:18   Dg Lumbar Spine 1 View  Result Date: 12/05/2017 CLINICAL DATA:  Lumbar laminectomy.  Epidural abscess. EXAM: LUMBAR SPINE - 1 VIEW COMPARISON:  MRI lumbar spine 12/05/2017 FINDINGS: Cross-table lateral intraoperative examination is obtained demonstrating surgical retractors posterior to the lumbar spine and a localization marker projected over the soft tissues posterior to the L4-5 lumbar interspace. Rod and screw fixations are demonstrated extending from the thoracic spine down to the level of L3. Alignment of the lumbar spine appears intact. IMPRESSION: Intraoperative localization of L4-5. Electronically Signed   By: Lucienne Capers M.D.   On: 12/05/2017 22:50     Medications:  Scheduled: . bupivacaine liposome  20 mL Infiltration Once  . bupivacaine-EPINEPHrine  50 mL Infiltration Once  . cloNIDine  0.1 mg Oral QAC breakfast  . docusate sodium  100 mg Oral BID  . guaiFENesin  1,200 mg Oral BID  . heparin  5,000 Units Subcutaneous Q8H  . lactobacillus  1 g Oral TID WC  . methadone  40 mg Oral Daily  . nicotine  21 mg Transdermal Daily  . sodium chloride flush  3 mL Intravenous Q12H   Continuous: . sodium chloride 100 mL/hr at 12/07/17 0444  .  ceFAZolin (ANCEF) IV 2 g (12/07/17 0518)  . lactated ringers    . magnesium sulfate 1 - 4 g bolus IVPB     HBZ:[id number] **OR** acetaminophen, bisacodyl, cyclobenzaprine, dicyclomine, HYDROmorphone (DILAUDID) injection, hydrOXYzine, ketorolac, lip balm, loperamide, menthol-cetylpyridinium **OR** phenol, methocarbamol, naproxen, ondansetron **OR** ondansetron (ZOFRAN) IV, ondansetron, oxyCODONE, zolpidem  Assessment/Plan:    MSSA bacteremia with tricuspid valve endocarditis/Sepsis This is most likely secondary to IV drug use.  Patient was on cefazolin which  was changed over to vancomycin and ceftriaxone by neurosurgery.  Infectious disease is following.  It appears that she was changed back to cefazolin.  Will defer antibiotics to ID.  RPR nonreactive.  HIV nonreactive.  Hepatitis C RNA by PCR negative.  Hepatitis A and B neg. Echocardiogram suggests severe tricuspid regurgitation.  Cardiology was consulted.  They recommend completing treatment  with IV antibiotics and repeat echocardiogram afterwards to determine if she will need further intervention.  No plans for TEE currently.    Lumbar epidural abscess Most likely secondary to above.  Patient underwent lumbar laminectomy and drainage of abscess 7/25.  Pain control.  Neurosurgery is following.  History of scoliosis status post previous surgery/chronic back pain Patient underwent MRI which showed epidural abscess as discussed above.  Patient mentioned that she takes methadone prescribed by an outpatient clinic.  [facility] prescriber database was queried and did not show any prescriptions for methadone. [facility] in [place] was called.  Apparently patient was being given 80 mg of methadone at the clinic (no prescription) starting in April and last dose was on October 25, 2017.  But patient has not returned to the clinic since that and so she has been discharged.  And since no prescription was written, this information apparently does not show up on the [facility] prescribers database. Patient was started on methadone a few days ago which will be continued for now.    Mild acute kidney injury Resolved.  Hypomagnesemia Replete.  Generalized edema/anasarca Likely due to tricuspid regurgitation as well as sepsis and IV fluids.  We will stop her IV fluids for now.  Allow her to auto diurese.  If she does not then she may benefit from some Lasix.  Daily weights.  Strict ins and outs.  Normocytic anemia Likely anemia of acute illness.  No evidence of overt bleeding.  Platelet count noted to be  elevated.  Polysubstance use  Patient's urine drug screen on 11/21/17 was positive for cocaine opiates amphetamines barbiturates and benzodiazepine.  Patient was seen at the [facility] clinic up until October 25, 2017.  She was getting methadone 80 mg at the clinic on a daily basis.  No urine drug screen done.  She was placed on clonidine for withdrawal symptoms and she has completed this protocol.  DVT Prophylaxis: Subcutaneous heparin    Code Status: Full code Family Communication: Discussed with the patient Disposition Plan: Management as outlined above.  Mobilize.  PICC line to be placed today.  When she is medically improved she will likely need to go to skilled nursing facility for rehab as well as for long-term IV antibiotics.    LOS: 5 days   [facility] Pager [phone] 12/07/2017, 8:37 AM  If 7PM-7AM, please contact night-coverage at [email or web address], password [email or web address] [facility]

## 2017-12-07 NOTE — Progress Notes (Signed)
Called social worker about placement of patient in order to get her PICC placed.   Social worker called back and stated that "it will be hard to place patient in a SNF because of patient being on methadone.

## 2017-12-07 NOTE — Progress Notes (Signed)
Physical Therapy Treatment Patient Details Name: Kelly [name] MRN: [id number] DOB: 01/22/1986 Today's Date: 12/07/2017    History of Present Illness 32 y.o. female admitted on 12-02-17 for fever and chills.  Pt dx with sepsis, tachycardia, tachypnea, leukocytosis, hypotension.  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  Pt underwent lumbarlaminectomy L3-S1 for drainage of abcess on 12-05-17.  Pt with known PMH of polysubstance abuse, ADD, scoliosis sp back surgery (20 years ago per mom).       PT Comments    Patient seen for mobility progression. Pt agreeable to participate despite c/o chest and back pain. Pt reports decreased sensation in bilat LE (L >R). Stedy standing frame utilized for sit to stand transfers and transfer bed to recliner. Pt requires mod A +2 for STS and min/mod A for standing balance using standing frame. Continue to progress as tolerated.   Follow Up Recommendations  CIR     Equipment Recommendations  Wheelchair (measurements PT);Wheelchair cushion (measurements PT);3in1 (PT);Hospital bed    Recommendations for Other Services Rehab consult     Precautions / Restrictions Precautions Precautions: Back Precaution Booklet Issued: No Precaution Comments: pt unable to recall precuations; 3/3 precautions reviewed with pt Restrictions Weight Bearing Restrictions: No    Mobility  Bed Mobility Overal bed mobility: Needs Assistance Bed Mobility: Sidelying to Sit Rolling: (vc for sequencing) Sidelying to sit: Max assist;+2 for physical assistance;HOB elevated     Sit to sidelying: (bed rail, assist for BLE, support of trunk for descent) General bed mobility comments: pt in sidelying upon arrival; assistance needed to bring bilat LE from EOB, scoot hips, and elevate trunk into sitting; cues for sequencing and  technique  Transfers Overall transfer level: Needs assistance   Transfers: Sit to/from Stand Sit to Stand: Mod assist;+2 physical assistance;From elevated surface         General transfer comment: assistance required to power up into standing and to maintain upright posture upon standing; multimodal cues for hip extension and posture  Ambulation/Gait             General Gait Details: unable at this time.    Stairs             Wheelchair Mobility    Modified Rankin (Stroke Patients Only)       Balance Overall balance assessment: Needs assistance Sitting-balance support: Feet supported;Bilateral upper extremity supported Sitting balance-Leahy Scale: Fair Sitting balance - Comments: close supervision EOB.     Standing balance support: Bilateral upper extremity supported Standing balance-Leahy Scale: Poor                              Cognition Arousal/Alertness: Awake/alert Behavior During Therapy: Anxious Overall Cognitive Status: Within Functional Limits for tasks assessed                                        Exercises      General Comments General comments (skin integrity, edema, etc.): pt reports decreased sensation in bilat LE (L > R)      Pertinent Vitals/Pain Pain Assessment: Faces Faces Pain Scale: Hurts even more Pain Location: chest and back Pain Descriptors / Indicators: Sore;Grimacing;Guarding;Moaning Pain Intervention(s): Limited activity within patient's tolerance;Monitored during  session;Premedicated before session;Repositioned    Home Living                      Prior Function            PT Goals (current goals can now be found in the care plan section) Acute Rehab PT Goals PT Goal Formulation: With patient Time For Goal Achievement: 12/20/17 Potential to Achieve Goals: Good Progress towards PT goals: Progressing toward goals    Frequency    Min 5X/week      PT Plan Current  plan remains appropriate    Co-evaluation              AM-PAC PT "6 Clicks" Daily Activity  Outcome Measure  Difficulty turning over in bed (including adjusting bedclothes, sheets and blankets)?: Unable Difficulty moving from lying on back to sitting on the side of the bed? : Unable Difficulty sitting down on and standing up from a chair with arms (e.g., wheelchair, bedside commode, etc,.)?: Unable Help needed moving to and from a bed to chair (including a wheelchair)?: A Lot Help needed walking in hospital room?: Total Help needed climbing 3-5 steps with a railing? : Total 6 Click Score: 7    End of Session Equipment Utilized During Treatment: Gait belt Activity Tolerance: Patient tolerated treatment well Patient left: with call bell/phone within reach;in chair;with bed alarm set;with nursing/sitter in room Nurse Communication: Mobility status;Other (comment)(RN present for mobility) PT Visit Diagnosis: Muscle weakness (generalized) (M62.81);Difficulty in walking, not elsewhere classified (R26.2);Pain;Other symptoms and signs involving the nervous system (R29.898) Pain - Right/Left: (lower back) Pain - part of body: (back)     Time: 1224-4975 PT Time Calculation (min) (ACUTE ONLY): 30 min  Charges:  $Therapeutic Activity: 23-37 mins                     Earney Navy, PTA Pager: [phone]     Darliss Cheney 12/07/2017, 10:33 AM

## 2017-12-07 NOTE — Progress Notes (Signed)
Spoke with Jonelle Sidle RN re PICC placement plan for 12/08/17.  States both PIV are still working well at this time.

## 2017-12-07 NOTE — Progress Notes (Signed)
Nurse spoke with IV team for PICC placement. Patient needs 8 weeks of treatment. Viewed note from inpatient admission coordinator. Need a plan whether rehab or somewhere so patient will be placed the entirety of treatment.

## 2017-12-07 NOTE — Progress Notes (Signed)
Spoke with Jonelle Sidle RN re d/c plan for pt with PICC line.  RN to speak with CM and MD to day.  Pt currently has 2 PIV working well and infusing.  No plan for d/c today.  Pt for 8 weeks antibiotic therapy per ID note, with history IVDA.

## 2017-12-07 NOTE — Progress Notes (Addendum)
Patient ID: Kelly [name], female   DOB: 1985/06/27, 32 y.o.   MRN: [id number] Overall patient doing okay condition of back pain  Strength 4out of 5 at his baseline slow improvement wound clean dry and intact  Continue Hemovac canal mobilize with physical occupational therapy work on pain management continue IV antibiotics

## 2017-12-07 NOTE — Progress Notes (Signed)
Pt mom called for an update. Asking about dilaudid regime. Stating her daughter "called her crying because staff was taking too long to give her pain medication". Nurse stated that patient received morphine 3 times throughout the night and one dose of dilaudid in between the first and second dose of morphine. Mom states that patient is "allergic" to morphine. Nurse explained to mom that patient had no reactions related to receiving morphine overnight. Nurse told mom that doctor was present and asked if she could call back.

## 2017-12-08 DIAGNOSIS — B9561 Methicillin susceptible Staphylococcus aureus infection as the cause of diseases classified elsewhere: Secondary | ICD-10-CM | POA: Insufficient documentation

## 2017-12-08 DIAGNOSIS — I330 Acute and subacute infective endocarditis: Secondary | ICD-10-CM

## 2017-12-08 LAB — BASIC METABOLIC PANEL
ANION GAP: 9 (ref 5–15)
BUN: <5 mg/dL — ABNORMAL LOW (ref 6–20)
CALCIUM: 8.1 mg/dL — AB (ref 8.9–10.3)
CHLORIDE: 104 mmol/L (ref 98–111)
CO2: 22 mmol/L (ref 22–32)
CREATININE: 0.48 mg/dL (ref 0.44–1.00)
GFR calc non Af Amer: >60 mL/min (ref >60)
Glucose, Bld: 88 mg/dL (ref 70–99)
Potassium: 4.4 mmol/L (ref 3.5–5.1)
SODIUM: 135 mmol/L (ref 135–145)

## 2017-12-08 LAB — MAGNESIUM: MAGNESIUM: 1.6 mg/dL — AB (ref 1.7–2.4)

## 2017-12-08 MED ORDER — MAGNESIUM SULFATE 4 GM/100ML IV SOLN
4.0000 g | Freq: Once | INTRAVENOUS | Status: AC
  Administered 2017-12-08: 4 g via INTRAVENOUS
  Filled 2017-12-08: qty 100

## 2017-12-08 MED ORDER — METHADONE HCL 10 MG PO TABS
60.0000 mg | ORAL_TABLET | Freq: Every day | ORAL | Status: DC
  Administered 2017-12-08 – 2018-01-30 (×54): 60 mg via ORAL
  Filled 2017-12-08 (×54): qty 6

## 2017-12-08 MED ORDER — SODIUM CHLORIDE 0.9% FLUSH
10.0000 mL | Freq: Two times a day (BID) | INTRAVENOUS | Status: DC
  Administered 2017-12-08 – 2018-01-28 (×31): 10 mL

## 2017-12-08 MED ORDER — SODIUM CHLORIDE 0.9% FLUSH
10.0000 mL | INTRAVENOUS | Status: DC | PRN
  Administered 2017-12-10 – 2018-01-28 (×12): 10 mL
  Filled 2017-12-08 (×12): qty 40

## 2017-12-08 MED ORDER — CHLORHEXIDINE GLUCONATE CLOTH 2 % EX PADS
6.0000 | MEDICATED_PAD | Freq: Every day | CUTANEOUS | Status: DC
  Administered 2017-12-09 – 2018-01-03 (×14): 6 via TOPICAL

## 2017-12-08 NOTE — Progress Notes (Signed)
NEUROSURGERY PROGRESS NOTE  Doing well. Complains of appropriate back soreness. No leg pain but still has some lower extremity weakness.  Incision CDI  Temp:  [98.3 F (36.8 C)-100.9 F (38.3 C)] 98.7 F (37.1 C) (07/28 0300) Pulse Rate:  [77-95] 77 (07/28 0300) Resp:  [24-34] 24 (07/28 0300) BP: (99-134)/(62-92) 132/92 (07/28 0300) SpO2:  [93 %-100 %] 96 % (07/28 0300)  Plan: Minimal output from hemovac, will d/c drain today. Continue therapies.   Eleonore Chiquito, NP 12/08/2017 8:09 AM

## 2017-12-08 NOTE — Progress Notes (Signed)
Peripherally Inserted Central Catheter/Midline Placement  The IV Nurse has discussed with the patient and/or persons authorized to consent for the patient, the purpose of this procedure and the potential benefits and risks involved with this procedure.  The benefits include less needle sticks, lab draws from the catheter, and the patient may be discharged home with the catheter. Risks include, but not limited to, infection, bleeding, blood clot (thrombus formation), and puncture of an artery; nerve damage and irregular heartbeat and possibility to perform a PICC exchange if needed/ordered by physician.  Alternatives to this procedure were also discussed.  Bard Power PICC patient education guide, fact sheet on infection prevention and patient information card has been provided to patient /or left at bedside.    PICC/Midline Placement Documentation  PICC Single Lumen 92/49/32 PICC Right Basilic 38 cm 0 cm (Active)  Indication for Insertion or Continuance of Line Prolonged intravenous therapies 12/08/2017  9:16 AM  Exposed Catheter (cm) 0 cm 12/08/2017  9:16 AM  Site Assessment Clean;Dry;Intact 12/08/2017  9:16 AM  Line Status Flushed;Saline locked;Blood return noted 12/08/2017  9:16 AM  Dressing Type Transparent 12/08/2017  9:16 AM  Dressing Status Clean;Dry;Intact;Antimicrobial disc in place 12/08/2017  9:16 AM  Line Care Connections checked and tightened 12/08/2017  9:16 AM  Line Adjustment (NICU/IV Team Only) No 12/08/2017  9:16 AM  Dressing Intervention New dressing 12/08/2017  9:16 AM  Dressing Change Due 12/15/17 12/08/2017  9:16 AM       [name] 12/08/2017, 9:17 AM

## 2017-12-08 NOTE — Progress Notes (Signed)
IV team on the unit for patient single lumen PICC placement.   Consent form is signed.  Patient in bed.

## 2017-12-08 NOTE — Progress Notes (Signed)
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  Patient also complained of back pain.  MRI of the lumbar spine suggested epidural abscess.  Patient was transferred to [facility] and underwent lumbar laminectomy for drainage of the epidural abscess.  Reason for Visit: MSSA bacteremia.  Epidural abscess.  Endocarditis.  Consultants: Infectious disease.  Neurosurgery.  Cardiology  Procedures:   L3, L4, L5 and S1 laminectomy for drainage of lumbar epidural abscess on 7/25  Transthoracic echocardiogram Study Conclusions  - Left ventricle: The cavity size was normal. Systolic function was   normal. The estimated ejection fraction was in the range of 55%   to 60%. Wall motion was normal; there were no regional wall   motion abnormalities. Left ventricular diastolic function   parameters were normal. - Aortic valve: Transvalvular velocity was within the normal range.   There was no stenosis. There was no regurgitation. - Mitral valve: Thickening of the anterior leaflet. Transvalvular   velocity was within the normal range. There was no evidence for   stenosis. There was trivial regurgitation. - Left atrium: The atrium was mildly dilated. - Right ventricle: The cavity size was normal. Wall thickness was   normal. Systolic function was normal. - Right atrium: The atrium was severely dilated. - Tricuspid valve: There was a medium-sized, 1.0 cm (W) x 1.7 cm   (L) vegetation on the septal leaflet. There was severe   regurgitation. - Pulmonary arteries: Systolic pressure was  mildly increased. PA   peak pressure: 42 mm Hg (S).  Impressions:  - Vegetation noted on the septal leaflet of the tricuspid valve   consistent with endocarditis. Cannot exlude vegetation on the   anterior leaflet of the mitral valve. Suggest TEE to better   assess.  Antibiotics: She was on cefazolin  Started on vancomycin and ceftriaxone by neurosurgery  Subjective/Interval History: Patient states that her back pain is improving but still quite significant.  Able to move her legs.  Denies any new complaints.     ROS: Denies any headaches.  Objective:  Vital Signs  Vitals:   12/07/17 1626 12/07/17 1900 12/07/17 2300 12/08/17 0300  BP: 99/62 134/87 (!) 133/92 (!) 132/92  Pulse: 92 90 95 77  Resp: (!) 25 (!) 27 (!) 32 (!) 24  Temp: 99.3 F (37.4 C) 98.3 F (36.8 C) (!) 100.9 F (38.3 C) 98.7 F (37.1 C)  TempSrc: Oral Oral Oral Oral  SpO2: 100% 99% 93% 96%  Weight:      Height:        Intake/Output Summary (Last 24 hours) at 12/08/2017 0829 Last data filed at 12/08/2017 0500 Gross per 24 hour  Intake 100 ml  Output 2380 ml  Net -2280 ml   Filed Weights   12/05/17 1128 12/05/17 2229 12/07/17 0804  Weight: 77.1 kg (170 lb) 89.9 kg (198 lb 3.1 oz) 94.7 kg (208 lb 12.4 oz)    General appearance: Awake alert.  In no distress Resp: Diminished air entry at the bases.  Normal effort at rest.  Few crackles at the bases.  Cardio: S1-S2 is normal regular.  No S3-S4.  No rubs murmurs  or bruit GI: Abdomen is soft.  Nontender nondistended Extremities: Mild edema bilateral upper and lower extremities Back: Drain is noted. Neurologic: No obvious focal neurological deficits.  Lab Results:  Data Reviewed: I have personally reviewed following labs and imaging studies  CBC: Recent Labs  Lab 12/03/17 0327 12/04/17 0538 12/05/17 0324 12/06/17 0632 12/07/17 0612  WBC 21.9* 14.9* 11.1* 11.8* 14.3*  HGB 9.6* 8.7* 7.2* 7.2* 7.5*  HCT 29.2* 27.1* 23.1* 23.8* 23.6*  MCV  88.0 90.3 92.0 91.9 90.1  PLT 358 444* 419* 414* 631*    Basic Metabolic Panel: Recent Labs  Lab 12/04/17 0538 12/05/17 0324 12/06/17 0632 12/07/17 0612 12/08/17 0218  NA 136 138 135 133* 135  K 4.0 4.1 4.5 4.6 4.4  CL 101 106 103 103 104  CO2 26 24 22 22 22   GLUCOSE 135* 100* 108* 108* 88  BUN 12 11 6  5* <5*  CREATININE 0.68 0.57 0.60 0.57 0.48  CALCIUM 8.9 8.7* 8.2* 8.3* 8.1*  MG  --   --  1.3* 1.5* 1.6*    GFR: Estimated Creatinine Clearance: 118.1 mL/min (by C-G formula based on SCr of 0.48 mg/dL).  Liver Function Tests: Recent Labs  Lab 12/02/17 1320 12/03/17 0327  AST 13* 14*  ALT 6 6  ALKPHOS 168* 211*  BILITOT 1.0 0.8  PROT 7.2 6.6  ALBUMIN 2.4* 2.0*      Recent Results (from the past 240 hour(s))  Blood culture (routine x 2)     Status: Abnormal   Collection Time: 12/02/17  2:55 PM  Result Value Ref Range Status   Specimen Description   Final    BLOOD LEFT FOOT Performed at [facility] [address] [place], [place] [address]    Special Requests   Final    BOTTLES DRAWN AEROBIC AND ANAEROBIC Blood Culture results may not be optimal due to an excessive volume of blood received in culture bottles Performed at [facility] [address], [place], [place] [address]    Culture  Setup Time   Final    IN BOTH AEROBIC AND ANAEROBIC BOTTLES GRAM POSITIVE COCCI CRITICAL RESULT CALLED TO, READ BACK BY AND VERIFIED WITH: B GREEN PHARMD 12/03/17 0532 JDW Performed at [facility], [place] [address] [place], [place] [address]    Culture STAPHYLOCOCCUS AUREUS (A)  Final   Report Status 12/05/2017 FINAL  Final   Organism ID, Bacteria STAPHYLOCOCCUS AUREUS  Final      Susceptibility   Staphylococcus aureus - MIC*    CIPROFLOXACIN <=0.5 SENSITIVE Sensitive     ERYTHROMYCIN >=8 RESISTANT Resistant     GENTAMICIN <=0.5 SENSITIVE Sensitive     OXACILLIN <=0.25 SENSITIVE Sensitive     TETRACYCLINE 8 INTERMEDIATE  Intermediate     VANCOMYCIN <=0.5 SENSITIVE Sensitive     TRIMETH/SULFA <=10 SENSITIVE Sensitive     CLINDAMYCIN <=0.25 SENSITIVE Sensitive     RIFAMPIN <=0.5 SENSITIVE Sensitive     Inducible Clindamycin NEGATIVE Sensitive     * STAPHYLOCOCCUS AUREUS  Blood Culture ID Panel (Reflexed)     Status: Abnormal   Collection Time: 12/02/17  2:55 PM  Result Value Ref Range Status   Enterococcus species NOT DETECTED NOT DETECTED Final   Listeria monocytogenes NOT DETECTED NOT DETECTED Final   Staphylococcus species DETECTED (A) NOT DETECTED Final    Comment: CRITICAL RESULT CALLED TO, READ BACK BY AND VERIFIED WITH: B GREEN PHARMD 12/03/17 0532 JDW    Staphylococcus aureus  DETECTED (A) NOT DETECTED Final    Comment: Methicillin (oxacillin) susceptible Staphylococcus aureus (MSSA). Preferred therapy is anti staphylococcal beta lactam antibiotic (Cefazolin or Nafcillin), unless clinically contraindicated. CRITICAL RESULT CALLED TO, READ BACK BY AND VERIFIED WITH: B GREEN PHARMD 12/03/17 0532 JDW    Methicillin resistance NOT DETECTED NOT DETECTED Final   Streptococcus species NOT DETECTED NOT DETECTED Final   Streptococcus agalactiae NOT DETECTED NOT DETECTED Final   Streptococcus pneumoniae NOT DETECTED NOT DETECTED Final   Streptococcus pyogenes NOT DETECTED NOT DETECTED Final   Acinetobacter baumannii NOT DETECTED NOT DETECTED Final   Enterobacteriaceae species NOT DETECTED NOT DETECTED Final   Enterobacter cloacae complex NOT DETECTED NOT DETECTED Final   Escherichia coli NOT DETECTED NOT DETECTED Final   Klebsiella oxytoca NOT DETECTED NOT DETECTED Final   Klebsiella pneumoniae NOT DETECTED NOT DETECTED Final   Proteus species NOT DETECTED NOT DETECTED Final   Serratia marcescens NOT DETECTED NOT DETECTED Final   Haemophilus influenzae NOT DETECTED NOT DETECTED Final   Neisseria meningitidis NOT DETECTED NOT DETECTED Final   Pseudomonas aeruginosa NOT DETECTED NOT DETECTED Final    Candida albicans NOT DETECTED NOT DETECTED Final   Candida glabrata NOT DETECTED NOT DETECTED Final   Candida krusei NOT DETECTED NOT DETECTED Final   Candida parapsilosis NOT DETECTED NOT DETECTED Final   Candida tropicalis NOT DETECTED NOT DETECTED Final    Comment: Performed at [facility], [place]. [address], [place], [place] [address]  Blood culture (routine x 2)     Status: Abnormal   Collection Time: 12/02/17  3:57 PM  Result Value Ref Range Status   Specimen Description   Final    BLOOD RIGHT ARM Performed at [facility] [address] [place], [place] [address]    Special Requests   Final    BOTTLES DRAWN AEROBIC AND ANAEROBIC Blood Culture adequate volume Performed at [facility] [address] [place], [place] [address]    Culture  Setup Time   Final    IN BOTH AEROBIC AND ANAEROBIC BOTTLES GRAM POSITIVE COCCI CRITICAL VALUE NOTED.  VALUE IS CONSISTENT WITH PREVIOUSLY REPORTED AND CALLED VALUE.    Culture (A)  Final    STAPHYLOCOCCUS AUREUS SUSCEPTIBILITIES PERFORMED ON PREVIOUS CULTURE WITHIN THE LAST 5 DAYS. Performed at [facility], [place] [address] [place], [place] [address]    Report Status 12/05/2017 FINAL  Final  MRSA PCR Screening     Status: None   Collection Time: 12/02/17  6:20 PM  Result Value Ref Range Status   MRSA by PCR NEGATIVE NEGATIVE Final    Comment:        The GeneXpert MRSA Assay (FDA approved for NASAL specimens only), is one component of a comprehensive MRSA colonization surveillance program. It is not intended to diagnose MRSA infection nor to guide or monitor treatment for MRSA infections. Performed at [name], [place] [address], [place], [place] [address]   Culture, blood (routine x 2)     Status: None (Preliminary result)   Collection Time: 12/04/17  8:03 AM  Result Value Ref Range Status   Specimen Description   Final    BLOOD LEFT ANTECUBITAL Performed at  [facility] [address], [place], [place] [address]    Special Requests   Final    BOTTLES DRAWN AEROBIC ONLY Blood Culture adequate volume Performed at [facility] [address], [place], [place] [address]  Culture   Final    NO GROWTH 3 DAYS Performed at [facility], [place] [address], [place], [place] [address]    Report Status PENDING  Incomplete  Culture, blood (routine x 2)     Status: None (Preliminary result)   Collection Time: 12/04/17  8:13 AM  Result Value Ref Range Status   Specimen Description   Final    BLOOD LEFT HAND Performed at [facility] [address], [place], [place] [address]    Special Requests   Final    BOTTLES DRAWN AEROBIC ONLY Blood Culture adequate volume Performed at [facility] [address] [place], [place] [address]    Culture   Final    NO GROWTH 3 DAYS Performed at [facility], [place] [address] [place], [place] [address]    Report Status PENDING  Incomplete  Aerobic/Anaerobic Culture (surgical/deep wound)     Status: None (Preliminary result)   Collection Time: 12/05/17  8:03 PM  Result Value Ref Range Status   Specimen Description ABSCESS  Final   Special Requests LUMBAR EPIDURAL  Final   Gram Stain   Final    ABUNDANT WBC PRESENT, PREDOMINANTLY PMN MODERATE GRAM POSITIVE COCCI Performed at [facility], [place] [address], [place],  [address]    Culture   Final    MODERATE STAPHYLOCOCCUS AUREUS SUSCEPTIBILITIES TO FOLLOW NO ANAEROBES ISOLATED; CULTURE IN PROGRESS FOR 5 DAYS    Report Status PENDING  Incomplete      Radiology Studies: [place] Ekg Site Rite  Result Date: 12/07/2017 If Site Rite image not attached, placement could not be confirmed due to current cardiac rhythm.    Medications:  Scheduled: . bupivacaine liposome  20 mL Infiltration Once  . bupivacaine-EPINEPHrine  50 mL Infiltration Once  . cloNIDine  0.1  mg Oral QAC breakfast  . docusate sodium  100 mg Oral BID  . guaiFENesin  1,200 mg Oral BID  . heparin  5,000 Units Subcutaneous Q8H  . lactobacillus  1 g Oral TID WC  . methadone  60 mg Oral Daily  . nicotine  21 mg Transdermal Daily  . sodium chloride flush  10-40 mL Intracatheter Q12H  . sodium chloride flush  3 mL Intravenous Q12H   Continuous: . sodium chloride 10 mL/hr at 12/07/17 1657  .  ceFAZolin (ANCEF) IV 2 g (12/08/17 0544)  . lactated ringers    . magnesium sulfate 1 - 4 g bolus IVPB     HDQ:[id number] **OR** acetaminophen, bisacodyl, cyclobenzaprine, dicyclomine, HYDROmorphone (DILAUDID) injection, hydrOXYzine, lip balm, loperamide, menthol-cetylpyridinium **OR** phenol, methocarbamol, naproxen, ondansetron **OR** ondansetron (ZOFRAN) IV, ondansetron, oxyCODONE, sodium chloride flush, zolpidem  Assessment/Plan:    MSSA bacteremia with tricuspid valve endocarditis/Sepsis This is most likely secondary to IV drug use.  Patient was on cefazolin which was changed over to vancomycin and ceftriaxone by neurosurgery.  Infectious disease is following.  It appears that she was changed back to cefazolin.  RPR nonreactive.  HIV nonreactive.  Hepatitis C RNA by PCR negative.  Hepatitis A and B neg. Echocardiogram suggests severe tricuspid regurgitation.  Cardiology was consulted.  They recommend completing treatment with IV antibiotics and repeat echocardiogram afterwards to determine if she will need further intervention.  No plans for TEE currently.    Lumbar epidural abscess Most likely secondary to above.  Patient underwent lumbar laminectomy and drainage of abscess 7/25.  Pain control.  Neurosurgery is following.  History of scoliosis status  post previous surgery/chronic back pain Patient underwent MRI which showed epidural abscess as discussed above.  Patient mentioned that she takes methadone prescribed by an outpatient clinic.  [facility] prescriber database was queried  and did not show any prescriptions for methadone. [facility] in [place] was called.  Apparently patient was being given 80 mg of methadone at the clinic (no prescription) starting in April and last dose was on October 25, 2017.  But patient has not returned to the clinic since that and so she has been discharged.  And since no prescription was written, this information apparently does not show up on the [facility] prescribers database. Patient was started on methadone a few days ago which will be continued for now.  Will increase the dose to 60 mg to achieve better pain control.  Mild acute kidney injury Resolved.  Hypomagnesemia Magnesium level remains low.  She will be given a higher dose of magnesium sulfate today.  Generalized edema/anasarca Likely due to tricuspid regurgitation as well as sepsis and IV fluids.  IV fluids were stopped.  She appears to be auto diuresing.  Continue to monitor daily weights.  Strict ins and outs.    Normocytic anemia Likely anemia of acute illness.  No evidence of overt bleeding.  Platelet count noted to be elevated.  Monitor periodically.  Polysubstance use  Patient's urine drug screen on 11/21/17 was positive for cocaine opiates amphetamines barbiturates and benzodiazepine.  Patient was seen at the [name] clinic up until October 25, 2017.  She was getting methadone 80 mg at the clinic on a daily basis.  No urine drug screen done.  She was placed on clonidine for withdrawal symptoms and she has completed this protocol.  DVT Prophylaxis: Subcutaneous heparin    Code Status: Full code Family Communication: Discussed with the patient Disposition Plan: Management as outlined above.  PICC line to be placed today.  Disposition is not clear as the patient needs long-term IV antibiotics and has a history of polysubstance abuse.     LOS: 6 days   [facility] Pager [phone] 12/08/2017, 8:29 AM  If 7PM-7AM, please contact night-coverage at  [email or web address], password [facility]

## 2017-12-08 NOTE — Progress Notes (Signed)
Nurse spoke with doctor.  Morphine was discontinued.

## 2017-12-09 LAB — BASIC METABOLIC PANEL
ANION GAP: 6 (ref 5–15)
BUN: 5 mg/dL — ABNORMAL LOW (ref 6–20)
CALCIUM: 8 mg/dL — AB (ref 8.9–10.3)
CO2: 29 mmol/L (ref 22–32)
Chloride: 99 mmol/L (ref 98–111)
Creatinine, Ser: 0.48 mg/dL (ref 0.44–1.00)
GFR calc Af Amer: >60 mL/min (ref >60)
Glucose, Bld: 89 mg/dL (ref 70–99)
POTASSIUM: 4.3 mmol/L (ref 3.5–5.1)
Sodium: 134 mmol/L — ABNORMAL LOW (ref 135–145)

## 2017-12-09 LAB — CBC
HEMATOCRIT: 23.9 % — AB (ref 36.0–46.0)
HEMOGLOBIN: 7.4 g/dL — AB (ref 12.0–15.0)
MCH: 28.5 pg (ref 26.0–34.0)
MCHC: 31 g/dL (ref 30.0–36.0)
MCV: 91.9 fL (ref 78.0–100.0)
Platelets: 383 10*3/uL (ref 150–400)
RBC: 2.6 MIL/uL — ABNORMAL LOW (ref 3.87–5.11)
RDW: 14.2 % (ref 11.5–15.5)
WBC: 13.3 10*3/uL — ABNORMAL HIGH (ref 4.0–10.5)

## 2017-12-09 LAB — CULTURE, BLOOD (ROUTINE X 2)
Culture: NO GROWTH
Culture: NO GROWTH
Special Requests: ADEQUATE
Special Requests: ADEQUATE

## 2017-12-09 LAB — MAGNESIUM: MAGNESIUM: 1.7 mg/dL (ref 1.7–2.4)

## 2017-12-09 MED ORDER — MAGNESIUM SULFATE 2 GM/50ML IV SOLN
2.0000 g | Freq: Once | INTRAVENOUS | Status: AC
  Administered 2017-12-09: 2 g via INTRAVENOUS
  Filled 2017-12-09: qty 50

## 2017-12-09 MED ORDER — MAGNESIUM OXIDE 400 (241.3 MG) MG PO TABS
400.0000 mg | ORAL_TABLET | Freq: Two times a day (BID) | ORAL | Status: DC
  Administered 2017-12-09 – 2018-01-30 (×104): 400 mg via ORAL
  Filled 2017-12-09 (×107): qty 1

## 2017-12-09 NOTE — Progress Notes (Signed)
Patient ID: Kelly [name], female   DOB: September 05, 1985, 32 y.o.   MRN: [id number] Patient sitting up in chair looks much better pain well controlled  Neurologically improved postop with increased leg strength.  Incision clean dry and intact.  Continue IV antibiotics per infectious disease no new's neurosurgical recommendations reconsult as needed.

## 2017-12-09 NOTE — Progress Notes (Signed)
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  Patient also complained of back pain.  MRI of the lumbar spine suggested epidural abscess.  Patient was transferred to [facility] and underwent lumbar laminectomy for drainage of the epidural abscess.  Reason for Visit: MSSA bacteremia.  Epidural abscess.  Endocarditis.  Consultants: Infectious disease.  Neurosurgery.  Cardiology  Procedures:   L3, L4, L5 and S1 laminectomy for drainage of lumbar epidural abscess on 7/25  Transthoracic echocardiogram Study Conclusions  - Left ventricle: The cavity size was normal. Systolic function was   normal. The estimated ejection fraction was in the range of 55%   to 60%. Wall motion was normal; there were no regional wall   motion abnormalities. Left ventricular diastolic function   parameters were normal. - Aortic valve: Transvalvular velocity was within the normal range.   There was no stenosis. There was no regurgitation. - Mitral valve: Thickening of the anterior leaflet. Transvalvular   velocity was within the normal range. There was no evidence for   stenosis. There was trivial regurgitation. - Left atrium: The atrium was mildly dilated. - Right ventricle: The cavity size was normal. Wall thickness was   normal. Systolic function was normal. - Right atrium: The atrium was severely dilated. - Tricuspid valve: There was a medium-sized, 1.0 cm (W) x 1.7 cm   (L) vegetation on the septal leaflet. There was severe   regurgitation. - Pulmonary arteries: Systolic pressure was  mildly increased. PA   peak pressure: 42 mm Hg (S).  Impressions:  - Vegetation noted on the septal leaflet of the tricuspid valve   consistent with endocarditis. Cannot exlude vegetation on the   anterior leaflet of the mitral valve. Suggest TEE to better   assess.  Antibiotics: She was on cefazolin  Started on vancomycin and ceftriaxone by neurosurgery  Changed back to cefazolin by ID  Subjective/Interval History: Patient states that her back pain is improving but still quite significant.  Able to move her legs.  Denies any new complaints.     ROS: Denies any headaches  Objective:  Vital Signs  Vitals:   12/08/17 2300 12/09/17 0300 12/09/17 0500 12/09/17 0800  BP: (!) 133/94 (!) 141/97    Pulse: 78 80    Resp: 19 (!) 24    Temp: 98.6 F (37 C) 98.3 F (36.8 C)  99.1 F (37.3 C)  TempSrc: Oral Oral    SpO2: 98% 96%    Weight:   95.1 kg (209 lb 10.5 oz)   Height:        Intake/Output Summary (Last 24 hours) at 12/09/2017 1049 Last data filed at 12/09/2017 0900 Gross per 24 hour  Intake 1360 ml  Output 3040 ml  Net -1680 ml   Filed Weights   12/07/17 0804 12/08/17 0715 12/09/17 0500  Weight: 94.7 kg (208 lb 12.4 oz) 94 kg (207 lb 3.7 oz) 95.1 kg (209 lb 10.5 oz)    General appearance: Awake alert.  In no distress Resp: Diminished air entry at the bases.  Normal effort.  Few crackles.  No wheezing. Cardio: S1-S2 is normal regular.  No S3-S4.  No rubs murmurs or bruit GI: Abdomen remains soft.   Extremities: Improving edema bilateral upper and lower extremities Back: Drain is noted. Neurologic: No obvious focal neurological deficits.  Lab Results:  Data Reviewed: I have personally reviewed following labs and imaging studies  CBC: Recent Labs  Lab 12/04/17 0538 12/05/17 0324 12/06/17 0632 12/07/17 0612 12/09/17 0354  WBC 14.9* 11.1* 11.8* 14.3* 13.3*  HGB 8.7* 7.2* 7.2* 7.5* 7.4*  HCT 27.1* 23.1* 23.8* 23.6* 23.9*  MCV 90.3 92.0 91.9 90.1 91.9    PLT 444* 419* 414* 631* 712    Basic Metabolic Panel: Recent Labs  Lab 12/05/17 0324 12/06/17 0632 12/07/17 0612 12/08/17 0218 12/09/17 0354  NA 138 135 133* 135 134*  K 4.1 4.5 4.6 4.4 4.3  CL 106 103 103 104 99  CO2 24 22 22 22 29   GLUCOSE 100* 108* 108* 88 89  BUN 11 6 5* <5* 5*  CREATININE 0.57 0.60 0.57 0.48 0.48  CALCIUM 8.7* 8.2* 8.3* 8.1* 8.0*  MG  --  1.3* 1.5* 1.6* 1.7    GFR: Estimated Creatinine Clearance: 118.4 mL/min (by C-G formula based on SCr of 0.48 mg/dL).  Liver Function Tests: Recent Labs  Lab 12/02/17 1320 12/03/17 0327  AST 13* 14*  ALT 6 6  ALKPHOS 168* 211*  BILITOT 1.0 0.8  PROT 7.2 6.6  ALBUMIN 2.4* 2.0*      Recent Results (from the past 240 hour(s))  Blood culture (routine x 2)     Status: Abnormal   Collection Time: 12/02/17  2:55 PM  Result Value Ref Range Status   Specimen Description   Final    BLOOD LEFT FOOT Performed at [facility] [address], [place], [place] [address]    Special Requests   Final    BOTTLES DRAWN AEROBIC AND ANAEROBIC Blood Culture results may not be optimal due to an excessive volume of blood received in culture bottles Performed at [facility] [address], [place], [place] [address]    Culture  Setup Time   Final    IN BOTH AEROBIC AND ANAEROBIC BOTTLES GRAM POSITIVE COCCI CRITICAL RESULT CALLED TO, READ BACK BY AND VERIFIED WITH: B GREEN PHARMD 12/03/17 0532 JDW Performed at [facility], [place] [address], [place], [place] [address]    Culture STAPHYLOCOCCUS AUREUS (A)  Final   Report Status 12/05/2017 FINAL  Final   Organism ID, Bacteria STAPHYLOCOCCUS AUREUS  Final      Susceptibility   Staphylococcus aureus - MIC*    CIPROFLOXACIN <=0.5 SENSITIVE Sensitive     ERYTHROMYCIN >=8 RESISTANT Resistant     GENTAMICIN <=0.5 SENSITIVE Sensitive     OXACILLIN <=0.25 SENSITIVE Sensitive     TETRACYCLINE 8 INTERMEDIATE Intermediate     VANCOMYCIN  <=0.5 SENSITIVE Sensitive     TRIMETH/SULFA <=10 SENSITIVE Sensitive     CLINDAMYCIN <=0.25 SENSITIVE Sensitive     RIFAMPIN <=0.5 SENSITIVE Sensitive     Inducible Clindamycin NEGATIVE Sensitive     * STAPHYLOCOCCUS AUREUS  Blood Culture ID Panel (Reflexed)     Status: Abnormal   Collection Time: 12/02/17  2:55 PM  Result Value Ref Range Status   Enterococcus species NOT DETECTED NOT DETECTED Final   Listeria monocytogenes NOT DETECTED NOT DETECTED Final   Staphylococcus species DETECTED (A) NOT DETECTED Final    Comment: CRITICAL RESULT CALLED TO, READ BACK BY AND VERIFIED WITH: B GREEN PHARMD 12/03/17 0532 JDW    Staphylococcus  aureus DETECTED (A) NOT DETECTED Final    Comment: Methicillin (oxacillin) susceptible Staphylococcus aureus (MSSA). Preferred therapy is anti staphylococcal beta lactam antibiotic (Cefazolin or Nafcillin), unless clinically contraindicated. CRITICAL RESULT CALLED TO, READ BACK BY AND VERIFIED WITH: B GREEN PHARMD 12/03/17 0532 JDW    Methicillin resistance NOT DETECTED NOT DETECTED Final   Streptococcus species NOT DETECTED NOT DETECTED Final   Streptococcus agalactiae NOT DETECTED NOT DETECTED Final   Streptococcus pneumoniae NOT DETECTED NOT DETECTED Final   Streptococcus pyogenes NOT DETECTED NOT DETECTED Final   Acinetobacter baumannii NOT DETECTED NOT DETECTED Final   Enterobacteriaceae species NOT DETECTED NOT DETECTED Final   Enterobacter cloacae complex NOT DETECTED NOT DETECTED Final   Escherichia coli NOT DETECTED NOT DETECTED Final   Klebsiella oxytoca NOT DETECTED NOT DETECTED Final   Klebsiella pneumoniae NOT DETECTED NOT DETECTED Final   Proteus species NOT DETECTED NOT DETECTED Final   Serratia marcescens NOT DETECTED NOT DETECTED Final   Haemophilus influenzae NOT DETECTED NOT DETECTED Final   Neisseria meningitidis NOT DETECTED NOT DETECTED Final   Pseudomonas aeruginosa NOT DETECTED NOT DETECTED Final   Candida albicans NOT DETECTED NOT  DETECTED Final   Candida glabrata NOT DETECTED NOT DETECTED Final   Candida krusei NOT DETECTED NOT DETECTED Final   Candida parapsilosis NOT DETECTED NOT DETECTED Final   Candida tropicalis NOT DETECTED NOT DETECTED Final    Comment: Performed at [facility], [place]. [address] [place], [place] [address]  Blood culture (routine x 2)     Status: Abnormal   Collection Time: 12/02/17  3:57 PM  Result Value Ref Range Status   Specimen Description   Final    BLOOD RIGHT ARM Performed at [facility] [address] [place], [place] [address]    Special Requests   Final    BOTTLES DRAWN AEROBIC AND ANAEROBIC Blood Culture adequate volume Performed at [facility] [address], [place], [place] [address]    Culture  Setup Time   Final    IN BOTH AEROBIC AND ANAEROBIC BOTTLES GRAM POSITIVE COCCI CRITICAL VALUE NOTED.  VALUE IS CONSISTENT WITH PREVIOUSLY REPORTED AND CALLED VALUE.    Culture (A)  Final    STAPHYLOCOCCUS AUREUS SUSCEPTIBILITIES PERFORMED ON PREVIOUS CULTURE WITHIN THE LAST 5 DAYS. Performed at [facility], [place] [address], [place], [place] [address]    Report Status 12/05/2017 FINAL  Final  MRSA PCR Screening     Status: None   Collection Time: 12/02/17  6:20 PM  Result Value Ref Range Status   MRSA by PCR NEGATIVE NEGATIVE Final    Comment:        The GeneXpert MRSA Assay (FDA approved for NASAL specimens only), is one component of a comprehensive MRSA colonization surveillance program. It is not intended to diagnose MRSA infection nor to guide or monitor treatment for MRSA infections. Performed at [facility], [place] [address] [place], [place] [address]   Culture, blood (routine x 2)     Status: None (Preliminary result)   Collection Time: 12/04/17  8:03 AM  Result Value Ref Range Status   Specimen Description   Final    BLOOD LEFT ANTECUBITAL Performed at [facility] [address] [place], [place] [address]    Special Requests   Final    BOTTLES DRAWN AEROBIC ONLY Blood Culture adequate volume Performed at [facility] [address] [place], [place] [address]  Culture   Final    NO GROWTH 4 DAYS Performed at [facility], [place] [address], [place], [place] [address]    Report Status PENDING  Incomplete  Culture, blood (routine x 2)     Status: None (Preliminary result)   Collection Time: 12/04/17  8:13 AM  Result Value Ref Range Status   Specimen Description   Final    BLOOD LEFT HAND Performed at [facility] [address], [place], [place] [address]    Special Requests   Final    BOTTLES DRAWN AEROBIC ONLY Blood Culture adequate volume Performed at [facility] [address] [place], [place] [address]    Culture   Final    NO GROWTH 4 DAYS Performed at [facility], [place] [address] [place], [place] [address]    Report Status PENDING  Incomplete  Aerobic/Anaerobic Culture (surgical/deep wound)     Status: None (Preliminary result)   Collection Time: 12/05/17  8:03 PM  Result Value Ref Range Status   Specimen Description ABSCESS  Final   Special Requests LUMBAR EPIDURAL  Final   Gram Stain   Final    ABUNDANT WBC PRESENT, PREDOMINANTLY PMN MODERATE GRAM POSITIVE COCCI Performed at [facility], [place] [address], [place], [place] [address]    Culture   Final    MODERATE STAPHYLOCOCCUS AUREUS NO ANAEROBES ISOLATED; CULTURE IN PROGRESS FOR 5 DAYS    Report Status PENDING  Incomplete   Organism ID, Bacteria STAPHYLOCOCCUS AUREUS  Final      Susceptibility   Staphylococcus aureus - MIC*    CIPROFLOXACIN <=0.5 SENSITIVE Sensitive     ERYTHROMYCIN >=8 RESISTANT Resistant     GENTAMICIN <=0.5 SENSITIVE Sensitive     OXACILLIN <=0.25 SENSITIVE Sensitive     TETRACYCLINE >=16 RESISTANT Resistant     VANCOMYCIN <=0.5 SENSITIVE Sensitive     TRIMETH/SULFA  <=10 SENSITIVE Sensitive     CLINDAMYCIN <=0.25 SENSITIVE Sensitive     RIFAMPIN <=0.5 SENSITIVE Sensitive     Inducible Clindamycin NEGATIVE Sensitive     * MODERATE STAPHYLOCOCCUS AUREUS      Radiology Studies: No results found.   Medications:  Scheduled: . bupivacaine liposome  20 mL Infiltration Once  . bupivacaine-EPINEPHrine  50 mL Infiltration Once  . Chlorhexidine Gluconate Cloth  6 each Topical Q2200  . docusate sodium  100 mg Oral BID  . guaiFENesin  1,200 mg Oral BID  . heparin  5,000 Units Subcutaneous Q8H  . lactobacillus  1 g Oral TID WC  . magnesium oxide  400 mg Oral BID  . methadone  60 mg Oral Daily  . nicotine  21 mg Transdermal Daily  . sodium chloride flush  10-40 mL Intracatheter Q12H  . sodium chloride flush  10-40 mL Intracatheter Q12H  . sodium chloride flush  3 mL Intravenous Q12H   Continuous: . sodium chloride 10 mL/hr at 12/08/17 1003  .  ceFAZolin (ANCEF) IV 2 g (12/09/17 0522)  . lactated ringers 10 mL/hr at 12/08/17 1005   JJK:[id number] **OR** acetaminophen, bisacodyl, cyclobenzaprine, HYDROmorphone (DILAUDID) injection, lip balm, menthol-cetylpyridinium **OR** phenol, ondansetron **OR** ondansetron (ZOFRAN) IV, oxyCODONE, sodium chloride flush, sodium chloride flush, zolpidem  Assessment/Plan:    MSSA bacteremia with tricuspid valve endocarditis/Sepsis This is most likely secondary to IV drug use.  Patient was on cefazolin which was changed over to vancomycin and ceftriaxone by neurosurgery.  Infectious disease is following.  It appears that she was  changed back to cefazolin.  RPR nonreactive.  HIV nonreactive.  Hepatitis C RNA by PCR negative.  Hepatitis A and B neg. Echocardiogram suggests severe tricuspid regurgitation.  Cardiology was consulted.  They recommend completing treatment with IV antibiotics and repeat echocardiogram afterwards to determine if she will need further intervention.  No plans for TEE currently.  Per ID she will  need 8 weeks of IV cefazolin through September 18.  And may need oral treatment subsequently.  Lumbar epidural abscess Most likely secondary to above.  Patient underwent lumbar laminectomy and drainage of abscess 7/25.  Pain control.  Neurosurgery is following.  History of scoliosis status post previous surgery/chronic back pain Patient underwent MRI which showed epidural abscess as discussed above.  Patient mentioned that she takes methadone prescribed by an outpatient clinic.  [facility] prescriber database was queried and did not show any prescriptions for methadone. [facility] in [place] was called.  Apparently patient was being given 80 mg of methadone at the clinic (no prescription) starting in April and last dose was on October 25, 2017.  But patient has not returned to the clinic since that and so she has been discharged.  And since no prescription was written, this information apparently does not show up on the [place] prescribers database. Patient was started on methadone a few days ago which will be continued for now.  Methadone increased to 60 mg for better pain control.    Mild acute kidney injury Resolved.  Hypomagnesemia Magnesium level has improved but still on the lower side.  We will give another dose of magnesium sulfate intravenously and start on oral magnesium oxide.  Generalized edema/anasarca Likely due to tricuspid regurgitation as well as sepsis and IV fluids.  IV fluids were stopped.  She appears to be auto diuresing.  Continue to monitor daily weights.  Strict ins and outs.  Stable for the most part.  Normocytic anemia Likely anemia of acute illness.  No evidence of overt bleeding. Platelet Counts normal today. Monitor periodically.  Polysubstance use  Patient's urine drug screen on 11/21/17 was positive for cocaine opiates amphetamines barbiturates and benzodiazepine.  [facility]  She was getting methadone 80  mg at the clinic on a daily basis until 10/25/17.  No urine drug screen done here.  She was placed on clonidine for withdrawal symptoms and she has completed this protocol.  DVT Prophylaxis: Subcutaneous heparin    Code Status: Full code Family Communication: Discussed with the patient and with her mother with patient's permission. Disposition Plan: Management as outlined above.  PICC line was placed on 7/28.  Disposition is not clear as the patient needs long-term IV antibiotics in a supervised setting.     LOS: 7 days   [facility] Pager [phone] 12/09/2017, 10:49 AM  If 7PM-7AM, please contact night-coverage at [email or web address], password [facility]

## 2017-12-09 NOTE — Progress Notes (Signed)
Physical Therapy Treatment Patient Details Name: Kelly [name] MRN: [id number] DOB: 1985/06/09 Today's Date: 12/09/2017    History of Present Illness 32 y.o. female admitted on 12-02-17 for fever and chills.  Pt dx with sepsis, tachycardia, tachypnea, leukocytosis, hypotension.  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  Pt underwent lumbarlaminectomy L3-S1 for drainage of abcess on 12-05-17.  Pt with known PMH of polysubstance abuse, ADD, scoliosis sp back surgery (20 years ago per mom).   PICC placed 12/08/17.    PT Comments    Patient seen for activity progression. Patient showing improvements in LE movement today and was able to tolerate taking minimal steps with RW and multiple sit <> stands with increased assist. POC updated due to long term management needs re: medication (IV antibiotics). Will continue to see and progress as tolerated.   Follow Up Recommendations  SNF     Equipment Recommendations  Wheelchair (measurements PT);Wheelchair cushion (measurements PT);3in1 (PT);Hospital bed    Recommendations for Other Services Rehab consult     Precautions / Restrictions Precautions Precautions: Back Precaution Booklet Issued: No Restrictions Weight Bearing Restrictions: No    Mobility  Bed Mobility Overal bed mobility: Needs Assistance Bed Mobility: Rolling;Sidelying to Sit Rolling: Mod assist;+2 for physical assistance;+2 for safety/equipment Sidelying to sit: Max assist;+2 for physical assistance       General bed mobility comments: assistance needed for bending LLE and to bring bilat LE from EOB, scoot hips, and elevate trunk into sitting; cues for sequencing and technique  Transfers Overall transfer level: Needs assistance Equipment used: Rolling walker (2 wheeled) Transfers: Sit to/from Stand Sit to Stand:  Mod assist;+2 physical assistance;From elevated surface         General transfer comment: assistance required to power up into standing and to maintain upright posture upon standing; multimodal cues for hip extension and posture(performed x4 during session)  Ambulation/Gait Ambulation/Gait assistance: Mod assist;+2 physical assistance Gait Distance (Feet): 2 Feet Assistive device: Rolling walker (2 wheeled) Gait Pattern/deviations: Step-to pattern;Decreased stride length;Shuffle         Stairs             Wheelchair Mobility    Modified Rankin (Stroke Patients Only)       Balance Overall balance assessment: Needs assistance Sitting-balance support: Feet supported;Bilateral upper extremity supported Sitting balance-Leahy Scale: Fair Sitting balance - Comments: min A to min guard EOB   Standing balance support: Bilateral upper extremity supported Standing balance-Leahy Scale: Poor Standing balance comment: dependent on RW                            Cognition Arousal/Alertness: Awake/alert Behavior During Therapy: Anxious Overall Cognitive Status: Within Functional Limits for tasks assessed                                        Exercises      General Comments        Pertinent Vitals/Pain Pain Assessment: 0-10 Pain Score: 10-Worst pain ever Pain Location: back and L shoulder blade Pain Descriptors / Indicators: Sore;Grimacing;Guarding;Moaning Pain Intervention(s): Monitored during session;Limited activity within patient's tolerance;Repositioned;Premedicated before session    Home Living  Prior Function            PT Goals (current goals can now be found in the care plan section) Acute Rehab PT Goals Patient Stated Goal: "get stronger and less pain" PT Goal Formulation: With patient Time For Goal Achievement: 12/20/17 Potential to Achieve Goals: Good Progress towards PT goals: Progressing  toward goals    Frequency    Min 5X/week      PT Plan Discharge plan needs to be updated    Co-evaluation PT/OT/SLP Co-Evaluation/Treatment: Yes Reason for Co-Treatment: For patient/therapist safety;To address functional/ADL transfers PT goals addressed during session: Mobility/safety with mobility;Balance;Proper use of DME OT goals addressed during session: ADL's and self-care;Proper use of Adaptive equipment and DME;Strengthening/ROM      AM-PAC PT "6 Clicks" Daily Activity  Outcome Measure  Difficulty turning over in bed (including adjusting bedclothes, sheets and blankets)?: Unable Difficulty moving from lying on back to sitting on the side of the bed? : Unable Difficulty sitting down on and standing up from a chair with arms (e.g., wheelchair, bedside commode, etc,.)?: Unable Help needed moving to and from a bed to chair (including a wheelchair)?: A Lot Help needed walking in hospital room?: A Lot Help needed climbing 3-5 steps with a railing? : Total 6 Click Score: 8    End of Session Equipment Utilized During Treatment: Gait belt Activity Tolerance: Patient tolerated treatment well Patient left: in chair;with call bell/phone within reach;with bed alarm set Nurse Communication: Mobility status;Other (comment)(RN present for mobility) PT Visit Diagnosis: Muscle weakness (generalized) (M62.81);Difficulty in walking, not elsewhere classified (R26.2);Pain;Other symptoms and signs involving the nervous system (R29.898) Pain - Right/Left: (lower back) Pain - part of body: (back)     Time: 7416-3845 PT Time Calculation (min) (ACUTE ONLY): 30 min  Charges:  $Therapeutic Activity: 8-22 mins                     Alben Deeds, PT DPT  Board Certified Neurologic Specialist [place] 12/09/2017, 1:53 PM

## 2017-12-09 NOTE — Progress Notes (Addendum)
     Severe TR  With endocarditis No change in recommendations from initial consult note.   [facility] will sign off.   Medication Recommendations:  As above Other recommendations (labs, testing, etc): Repeat echo during follow up Follow up as an outpatient:  Dr. Gwenlyn Found 01/29/18 @ 10am for hospital follow up

## 2017-12-09 NOTE — Addendum Note (Signed)
Addendum  created 12/09/17 0845 by Audry Pili, MD   [facility] Staff edited

## 2017-12-09 NOTE — Progress Notes (Signed)
Occupational Therapy Treatment Patient Details Name: Kelly [name] MRN: [id number] DOB: [birth year] Today's Date: 12/09/2017    History of present illness 32 y.o. female admitted on 12-02-17 for fever and chills.  Pt dx with sepsis, tachycardia, tachypnea, leukocytosis, hypotension.  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  Pt underwent lumbarlaminectomy L3-S1 for drainage of abcess on 12-05-17.  Pt with known PMH of polysubstance abuse, ADD, scoliosis sp back surgery (20 years ago per mom).   PICC placed 12/08/17.   OT comments  Pt progressing towards goals, per chart review not a CIR candidate so recommendation changed to SNF as Pt will require post-acute rehab. TOday Pt was able to take steps towards BSC, and perform multiple sit<>stand transfers with mod +2 assist vc for safe hand placement. Total A for LB dressing and peri care, improvement for grooming activities in seated position. OT will continue to follow acutely.   Follow Up Recommendations  SNF;Supervision/Assistance - 24 hour    Equipment Recommendations  3 in 1 bedside commode;Wheelchair (measurements OT);Hospital bed;Wheelchair cushion (measurements OT)    Recommendations for Other Services      Precautions / Restrictions Precautions Precautions: Back Precaution Booklet Issued: No Restrictions Weight Bearing Restrictions: No       Mobility Bed Mobility Overal bed mobility: Needs Assistance Bed Mobility: Rolling;Sidelying to Sit Rolling: Mod assist;+2 for physical assistance;+2 for safety/equipment Sidelying to sit: Max assist;+2 for physical assistance       General bed mobility comments: assistance needed for bending LLE and to bring bilat LE from EOB, scoot hips, and elevate trunk into sitting; cues for sequencing and technique  Transfers Overall transfer  level: Needs assistance Equipment used: Rolling walker (2 wheeled) Transfers: Sit to/from Stand Sit to Stand: Mod assist;+2 physical assistance;From elevated surface         General transfer comment: assistance required to power up into standing and to maintain upright posture upon standing; multimodal cues for hip extension and posture    Balance Overall balance assessment: Needs assistance Sitting-balance support: Feet supported;Bilateral upper extremity supported Sitting balance-Leahy Scale: Fair Sitting balance - Comments: min A to min guard EOB   Standing balance support: Bilateral upper extremity supported Standing balance-Leahy Scale: Poor Standing balance comment: dependent in RW                           ADL either performed or assessed with clinical judgement   ADL Overall ADL's : Needs assistance/impaired     Grooming: Wash/dry face;Min guard;Sitting Grooming Details (indicate cue type and reason): on [facility]             Lower Body Dressing: Total assistance Lower Body Dressing Details (indicate cue type and reason): to don socks at bed level prior to [facility] transfer Toilet Transfer: Moderate assistance;+2 for physical assistance;+2 for safety/equipment;Stand-pivot;Ambulation;RW Toilet Transfer Details (indicate cue type and reason): max cues for weight shifting, Pt able to make it 1/3 way around and then we had to move the [facility] behind her.  Toileting- Clothing Manipulation and Hygiene: Total assistance         General ADL Comments: re-educated on back precautions     Vision       Perception     Praxis      Cognition Arousal/Alertness: Awake/alert Behavior During Therapy: Anxious Overall Cognitive Status:  Within Functional Limits for tasks assessed                                          Exercises     Shoulder Instructions       General Comments      Pertinent Vitals/ Pain       Pain Assessment: 0-10 Pain Score:  10-Worst pain ever Pain Location: back and L shoulder blade Pain Descriptors / Indicators: Sore;Grimacing;Guarding;Moaning Pain Intervention(s): Monitored during session;Limited activity within patient's tolerance;Repositioned;Premedicated before session  Home Living                                          Prior Functioning/Environment              Frequency  Min 2X/week        Progress Toward Goals  OT Goals(current goals can now be found in the care plan section)  Progress towards OT goals: Progressing toward goals  Acute Rehab OT Goals Patient Stated Goal: "get stronger and less pain" OT Goal Formulation: With patient/family Time For Goal Achievement: 12/20/17 Potential to Achieve Goals: Good  Plan Discharge plan needs to be updated;Frequency remains appropriate    Co-evaluation    PT/OT/SLP Co-Evaluation/Treatment: Yes Reason for Co-Treatment: For patient/therapist safety;To address functional/ADL transfers PT goals addressed during session: Mobility/safety with mobility;Balance;Proper use of DME OT goals addressed during session: ADL's and self-care;Proper use of Adaptive equipment and DME;Strengthening/ROM      AM-PAC PT "6 Clicks" Daily Activity     Outcome Measure   Help from another person eating meals?: A Little Help from another person taking care of personal grooming?: A Little Help from another person toileting, which includes using toliet, bedpan, or urinal?: A Lot Help from another person bathing (including washing, rinsing, drying)?: A Lot Help from another person to put on and taking off regular upper body clothing?: A Lot Help from another person to put on and taking off regular lower body clothing?: Total 6 Click Score: 13    End of Session Equipment Utilized During Treatment: Rolling walker;Gait belt  OT Visit Diagnosis: Unsteadiness on feet (R26.81);Other abnormalities of gait and mobility (R26.89);Muscle weakness  (generalized) (M62.81);Pain Pain - part of body: (back)   Activity Tolerance Patient tolerated treatment well   Patient Left in chair;with call bell/phone within reach;with chair alarm set   Nurse Communication Need for lift equipment;Mobility status;Other (comment)(stedy, no urination)        Time: 8016-5537 OT Time Calculation (min): 32 min  Charges: OT General Charges $OT Visit: 1 Visit OT Treatments $Self Care/Home Management : 8-22 mins  Hulda Humphrey OTR/L [place] 12/09/2017, 12:38 PM

## 2017-12-09 NOTE — Progress Notes (Addendum)
[facility] for Infectious Disease   Reason for visit: Follow up on MSSA bacteremia, TV endocarditis, discitis with epidural abscess  Interval History: remains afebrile, wBC 13.3, no associated rash or diarrhea.  Mother at bedside, no new complaints.   Physical Exam: Constitutional:  Vitals:   12/09/17 0746 12/09/17 0800  BP: 120/85   Pulse: 96   Resp: (!) 25   Temp:  99.1 F (37.3 C)  SpO2: 92%    patient appears in NAD, tearful during conversation Respiratory: Normal respiratory effort; CTA B Cardiovascular: RRR GI: soft, nt, nd Skin: no rashes  Review of Systems: Constitutional: negative for fevers and chills Gastrointestinal: negative for nausea and diarrhea  Lab Results  Component Value Date   WBC 13.3 (H) 12/09/2017   HGB 7.4 (L) 12/09/2017   HCT 23.9 (L) 12/09/2017   MCV 91.9 12/09/2017   PLT 383 12/09/2017    Lab Results  Component Value Date   CREATININE 0.48 12/09/2017   BUN 5 (L) 12/09/2017   NA 134 (L) 12/09/2017   K 4.3 12/09/2017   CL 99 12/09/2017   CO2 29 12/09/2017    Lab Results  Component Value Date   ALT 6 12/03/2017   AST 14 (L) 12/03/2017   ALKPHOS 211 (H) 12/03/2017     Microbiology: Recent Results (from the past 240 hour(s))  Blood culture (routine x 2)     Status: Abnormal   Collection Time: 12/02/17  2:55 PM  Result Value Ref Range Status   Specimen Description   Final    BLOOD LEFT FOOT Performed at [facility], [address] [place], [place] [address]    Special Requests   Final    BOTTLES DRAWN AEROBIC AND ANAEROBIC Blood Culture results may not be optimal due to an excessive volume of blood received in culture bottles Performed at [facility] [address], [place], [place] [address]    Culture  Setup Time   Final    IN BOTH AEROBIC AND ANAEROBIC BOTTLES GRAM POSITIVE COCCI CRITICAL RESULT CALLED TO, READ BACK BY AND VERIFIED WITH: B GREEN PHARMD 12/03/17 0532  JDW Performed at [facility], [place] [address], [place], [place] [address]    Culture STAPHYLOCOCCUS AUREUS (A)  Final   Report Status 12/05/2017 FINAL  Final   Organism ID, Bacteria STAPHYLOCOCCUS AUREUS  Final      Susceptibility   Staphylococcus aureus - MIC*    CIPROFLOXACIN <=0.5 SENSITIVE Sensitive     ERYTHROMYCIN >=8 RESISTANT Resistant     GENTAMICIN <=0.5 SENSITIVE Sensitive     OXACILLIN <=0.25 SENSITIVE Sensitive     TETRACYCLINE 8 INTERMEDIATE Intermediate     VANCOMYCIN <=0.5 SENSITIVE Sensitive     TRIMETH/SULFA <=10 SENSITIVE Sensitive     CLINDAMYCIN <=0.25 SENSITIVE Sensitive     RIFAMPIN <=0.5 SENSITIVE Sensitive     Inducible Clindamycin NEGATIVE Sensitive     * STAPHYLOCOCCUS AUREUS  Blood Culture ID Panel (Reflexed)     Status: Abnormal   Collection Time: 12/02/17  2:55 PM  Result Value Ref Range Status   Enterococcus species NOT DETECTED NOT DETECTED Final   Listeria monocytogenes NOT DETECTED NOT DETECTED Final   Staphylococcus species DETECTED (A) NOT DETECTED Final    Comment: CRITICAL RESULT CALLED TO, READ BACK BY AND VERIFIED WITH: B GREEN PHARMD 12/03/17 0532 JDW    Staphylococcus aureus DETECTED (A) NOT DETECTED Final    Comment: Methicillin (oxacillin) susceptible Staphylococcus aureus (  MSSA). Preferred therapy is anti staphylococcal beta lactam antibiotic (Cefazolin or Nafcillin), unless clinically contraindicated. CRITICAL RESULT CALLED TO, READ BACK BY AND VERIFIED WITH: B GREEN PHARMD 12/03/17 0532 JDW    Methicillin resistance NOT DETECTED NOT DETECTED Final   Streptococcus species NOT DETECTED NOT DETECTED Final   Streptococcus agalactiae NOT DETECTED NOT DETECTED Final   Streptococcus pneumoniae NOT DETECTED NOT DETECTED Final   Streptococcus pyogenes NOT DETECTED NOT DETECTED Final   Acinetobacter baumannii NOT DETECTED NOT DETECTED Final   Enterobacteriaceae species NOT DETECTED NOT DETECTED Final   Enterobacter cloacae complex NOT  DETECTED NOT DETECTED Final   Escherichia coli NOT DETECTED NOT DETECTED Final   Klebsiella oxytoca NOT DETECTED NOT DETECTED Final   Klebsiella pneumoniae NOT DETECTED NOT DETECTED Final   Proteus species NOT DETECTED NOT DETECTED Final   Serratia marcescens NOT DETECTED NOT DETECTED Final   Haemophilus influenzae NOT DETECTED NOT DETECTED Final   Neisseria meningitidis NOT DETECTED NOT DETECTED Final   Pseudomonas aeruginosa NOT DETECTED NOT DETECTED Final   Candida albicans NOT DETECTED NOT DETECTED Final   Candida glabrata NOT DETECTED NOT DETECTED Final   Candida krusei NOT DETECTED NOT DETECTED Final   Candida parapsilosis NOT DETECTED NOT DETECTED Final   Candida tropicalis NOT DETECTED NOT DETECTED Final    Comment: Performed at [facility], [place]. [address], [place], [place] [address]  Blood culture (routine x 2)     Status: Abnormal   Collection Time: 12/02/17  3:57 PM  Result Value Ref Range Status   Specimen Description   Final    BLOOD RIGHT ARM Performed at [facility] [address], [place], [place] [address]    Special Requests   Final    BOTTLES DRAWN AEROBIC AND ANAEROBIC Blood Culture adequate volume Performed at [place] [address] [place], [place] [address]    Culture  Setup Time   Final    IN BOTH AEROBIC AND ANAEROBIC BOTTLES GRAM POSITIVE COCCI CRITICAL VALUE NOTED.  VALUE IS CONSISTENT WITH PREVIOUSLY REPORTED AND CALLED VALUE.    Culture (A)  Final    STAPHYLOCOCCUS AUREUS SUSCEPTIBILITIES PERFORMED ON PREVIOUS CULTURE WITHIN THE LAST 5 DAYS. Performed at [facility], [place] [address] [place], [place] [address]    Report Status 12/05/2017 FINAL  Final  MRSA PCR Screening     Status: None   Collection Time: 12/02/17  6:20 PM  Result Value Ref Range Status   MRSA by PCR NEGATIVE NEGATIVE Final    Comment:        The GeneXpert MRSA Assay (FDA approved for NASAL specimens only), is one  component of a comprehensive MRSA colonization surveillance program. It is not intended to diagnose MRSA infection nor to guide or monitor treatment for MRSA infections. Performed at [facility], [place] [address] [place], [place] [address]   Culture, blood (routine x 2)     Status: None (Preliminary result)   Collection Time: 12/04/17  8:03 AM  Result Value Ref Range Status   Specimen Description   Final    BLOOD LEFT ANTECUBITAL Performed at [facility] [address], [place], [place] [address]    Special Requests   Final    BOTTLES DRAWN AEROBIC ONLY Blood Culture adequate volume Performed at [facility] [address], [place],  [address]    Culture   Final    NO GROWTH 4 DAYS Performed  at [facility], [place] [address], [place], [place] [address]    Report Status PENDING  Incomplete  Culture, blood (routine x 2)     Status: None (Preliminary result)   Collection Time: 12/04/17  8:13 AM  Result Value Ref Range Status   Specimen Description   Final    BLOOD LEFT HAND Performed at [facility] [address] [place], [place] [address]    Special Requests   Final    BOTTLES DRAWN AEROBIC ONLY Blood Culture adequate volume Performed at [facility] [address], [place], [place] [address]    Culture   Final    NO GROWTH 4 DAYS Performed at [facility], [place] [address] [place], [place] [address]    Report Status PENDING  Incomplete  Aerobic/Anaerobic Culture (surgical/deep wound)     Status: None (Preliminary result)   Collection Time: 12/05/17  8:03 PM  Result Value Ref Range Status   Specimen Description ABSCESS  Final   Special Requests LUMBAR EPIDURAL  Final   Gram Stain   Final    ABUNDANT WBC PRESENT, PREDOMINANTLY PMN MODERATE GRAM POSITIVE COCCI Performed at [facility], [place] [address] [place], [place] [address]    Culture   Final      MODERATE STAPHYLOCOCCUS AUREUS NO ANAEROBES ISOLATED; CULTURE IN PROGRESS FOR 5 DAYS    Report Status PENDING  Incomplete   Organism ID, Bacteria STAPHYLOCOCCUS AUREUS  Final      Susceptibility   Staphylococcus aureus - MIC*    CIPROFLOXACIN <=0.5 SENSITIVE Sensitive     ERYTHROMYCIN >=8 RESISTANT Resistant     GENTAMICIN <=0.5 SENSITIVE Sensitive     OXACILLIN <=0.25 SENSITIVE Sensitive     TETRACYCLINE >=16 RESISTANT Resistant     VANCOMYCIN <=0.5 SENSITIVE Sensitive     TRIMETH/SULFA <=10 SENSITIVE Sensitive     CLINDAMYCIN <=0.25 SENSITIVE Sensitive     RIFAMPIN <=0.5 SENSITIVE Sensitive     Inducible Clindamycin NEGATIVE Sensitive     * MODERATE STAPHYLOCOCCUS AUREUS    Impression/Plan:  1. MSSA bacteremia - on cefazolin and will continue for a prolonged course  2. Epidural abscess - s/p surgery.  Gram stain with GPC c/w patients known MSSA.  Has a history of a rod in place, but fortunately does not appear to communicate with the abscess per the mother. She will need 8 weeks of IV cefazolin in a supervised setting through September 18.  She may need to continue with oral therapy after that depending on progress.    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 Appreciated cardiology evaluation and input.  Severe TR.  To recheck TTE after 6 weeks to evaluate valve and considerations of surgery, if indicated.    4.  Access - difficult access and now with picc line placed.  Will need to remain in a supervised setting.   5. Substance abuse - on methadone.    I will continue to follow intermittently

## 2017-12-09 NOTE — Social Work (Signed)
Acknowledging recommendations for SNF placement, unable to place pt while she is receiving methadone. Also unable to place pt at SNF for longterm iv abx due to hx of polysubstance use.   Will follow for any additional supports for pt.   [place], [place] Work [phone]

## 2017-12-10 ENCOUNTER — Inpatient Hospital Stay (HOSPITAL_COMMUNITY): Payer: Medicaid Other

## 2017-12-10 LAB — BASIC METABOLIC PANEL
Anion gap: 6 (ref 5–15)
BUN: <5 mg/dL — ABNORMAL LOW (ref 6–20)
CHLORIDE: 94 mmol/L — AB (ref 98–111)
CO2: 30 mmol/L (ref 22–32)
Calcium: 8.4 mg/dL — ABNORMAL LOW (ref 8.9–10.3)
Creatinine, Ser: 0.54 mg/dL (ref 0.44–1.00)
GFR calc Af Amer: >60 mL/min (ref >60)
GFR calc non Af Amer: >60 mL/min (ref >60)
Glucose, Bld: 88 mg/dL (ref 70–99)
Potassium: 4.3 mmol/L (ref 3.5–5.1)
Sodium: 130 mmol/L — ABNORMAL LOW (ref 135–145)

## 2017-12-10 LAB — AEROBIC/ANAEROBIC CULTURE W GRAM STAIN (SURGICAL/DEEP WOUND)

## 2017-12-10 LAB — MAGNESIUM: Magnesium: 1.8 mg/dL (ref 1.7–2.4)

## 2017-12-10 LAB — AEROBIC/ANAEROBIC CULTURE (SURGICAL/DEEP WOUND)

## 2017-12-10 NOTE — Progress Notes (Signed)
Progress Note  Patient Name: Kelly [name] Date of Encounter: 12/10/2017  Primary Cardiologist:   No primary care provider on file.   Subjective   Tired and with back pain.  Some atypical chest pain as well.    Inpatient Medications    Scheduled Meds: . bupivacaine liposome  20 mL Infiltration Once  . bupivacaine-EPINEPHrine  50 mL Infiltration Once  . Chlorhexidine Gluconate Cloth  6 each Topical Q2200  . docusate sodium  100 mg Oral BID  . guaiFENesin  1,200 mg Oral BID  . heparin  5,000 Units Subcutaneous Q8H  . lactobacillus  1 g Oral TID WC  . magnesium oxide  400 mg Oral BID  . methadone  60 mg Oral Daily  . nicotine  21 mg Transdermal Daily  . sodium chloride flush  10-40 mL Intracatheter Q12H   Continuous Infusions: . sodium chloride 10 mL/hr at 12/08/17 1003  .  ceFAZolin (ANCEF) IV 2 g (12/10/17 1340)  . lactated ringers 10 mL/hr at 12/08/17 1005   PRN Meds: acetaminophen **OR** acetaminophen, bisacodyl, cyclobenzaprine, HYDROmorphone (DILAUDID) injection, lip balm, menthol-cetylpyridinium **OR** phenol, ondansetron **OR** ondansetron (ZOFRAN) IV, oxyCODONE, sodium chloride flush, zolpidem   Vital Signs    Vitals:   12/09/17 2300 12/10/17 0300 12/10/17 0445 12/10/17 0800  BP: 135/90 130/88  132/88  Pulse: 95 96    Resp: (!) 22 (!) 24    Temp: 99.7 F (37.6 C) 100.1 F (37.8 C)  98.3 F (36.8 C)  TempSrc: Oral Oral  Oral  SpO2: 99% 90%    Weight:   200 lb 9.9 oz (91 kg) 200 lb 9.9 oz (91 kg)  Height:        Intake/Output Summary (Last 24 hours) at 12/10/2017 1437 Last data filed at 12/10/2017 0900 Gross per 24 hour  Intake 480 ml  Output 2030 ml  Net -1550 ml   Filed Weights   12/09/17 0500 12/10/17 0445 12/10/17 0800  Weight: 209 lb 10.5 oz (95.1 kg) 200 lb 9.9 oz (91 kg) 200 lb 9.9 oz (91 kg)    Telemetry    NSR - Personally Reviewed  ECG    NA - Personally Reviewed  Physical Exam   GEN: No acute distress.   Neck: No   JVD Cardiac: RRR, 3/6 left mid sternal border murmur, no diastolic murmurs, rubs, or gallops.  Respiratory:    Decreased breath sounds at the bases.  GI: Soft, nontender, non-distended  MS:   Mild diffuse edema; No deformity. Neuro:  Nonfocal  Psych:   Somnolent.  Labs    Chemistry Recent Labs  Lab 12/08/17 0218 12/09/17 0354 12/10/17 0500  NA 135 134* 130*  K 4.4 4.3 4.3  CL 104 99 94*  CO2 22 29 30   GLUCOSE 88 89 88  BUN <5* 5* <5*  CREATININE 0.48 0.48 0.54  CALCIUM 8.1* 8.0* 8.4*  GFRNONAA >60 >60 >60  GFRAA >60 >60 >60  ANIONGAP 9 6 6      Hematology Recent Labs  Lab 12/06/17 0632 12/07/17 0612 12/09/17 0354  WBC 11.8* 14.3* 13.3*  RBC 2.59* 2.62* 2.60*  HGB 7.2* 7.5* 7.4*  HCT 23.8* 23.6* 23.9*  MCV 91.9 90.1 91.9  MCH 27.8 28.6 28.5  MCHC 30.3 31.8 31.0  RDW 14.0 13.9 14.2  PLT 414* 631* 383    Cardiac EnzymesNo results for input(s): TROPONINI in the last 168 hours. No results for input(s): TROPIPOC in the last 168 hours.   BNPNo results for input(s):  BNP, PROBNP in the last 168 hours.   DDimer No results for input(s): DDIMER in the last 168 hours.   Radiology    Dg Chest Port 1 View  Result Date: 12/10/2017 CLINICAL DATA:  32 year old female with a history of dyspnea EXAM: PORTABLE CHEST 1 VIEW COMPARISON:  12/02/2017 FINDINGS: Cardiomediastinal silhouette likely unchanged in size and contour, partially obscured by overlying lung/pleural disease. Opacities at the bilateral lung bases with failed appearance, blunting the costophrenic angles and partial obscuration of the right hemidiaphragm. Complete obscuration of left hemidiaphragm and inferior left heart border. Surgical changes of the thoracolumbar spine incompletely imaged. IMPRESSION: Worsening bilateral opacities at the lung bases, favored to represent a combination pleural effusion and atelectasis/consolidation. Electronically Signed   By: Corrie Mckusick D.O.   On: 12/10/2017 11:01    Cardiac  Studies   7/23  Echo  Study Conclusions  - Left ventricle: The cavity size was normal. Systolic function was   normal. The estimated ejection fraction was in the range of 55%   to 60%. Wall motion was normal; there were no regional wall   motion abnormalities. Left ventricular diastolic function   parameters were normal. - Aortic valve: Transvalvular velocity was within the normal range.   There was no stenosis. There was no regurgitation. - Mitral valve: Thickening of the anterior leaflet. Transvalvular   velocity was within the normal range. There was no evidence for   stenosis. There was trivial regurgitation. - Left atrium: The atrium was mildly dilated. - Right ventricle: The cavity size was normal. Wall thickness was   normal. Systolic function was normal. - Right atrium: The atrium was severely dilated. - Tricuspid valve: There was a medium-sized, 1.0 cm (W) x 1.7 cm   (L) vegetation on the septal leaflet. There was severe   regurgitation. - Pulmonary arteries: Systolic pressure was mildly increased. PA   peak pressure: 42 mm Hg (S).  Patient Profile     32 y.o. female with sepsis and PNA, known IV drug user, and on TTE + for tricuspid vegetation. EF 60-65%, severe TR, and  1.7 cm x 1.0 cm likely vegetation on the septal leaflet of the tricuspid valve consistent with endocarditis.  Trivial MR, thickening of the mitral valve leaflet severe RA and mild LA enlargement.     Assessment & Plan    Endocarditis:  I was called back to see the patient by her mom who works at [employer].  She was unclear of the plan of action.  Our plan was to complete antibiotics and then follow up in the weeks to come with repeat TTE and blood cultures per ID.  I spoke with Dr. [place] Pink.  There does not appear to be a clinical change.  She is somnolent and recovering slowly from back surgery.  She has low grade temp.  CXR with atelectasis and effusion.  I had a long discussion with the her mom and the  patient about the plan for continued antibiotics and I reviewed the TTE with them.  There is no indication of aortic or mitral valve involvement.  There is, in error, mention that the patient had a TEE but this was a TEE on the 23rd.  At this point, without evidence for further complications of bacteremia or evidence of left sided valve involvement I do not think that TEE will change the course of treatment.  I will confer with ID.  We will continue to follow given the concern and questions posed.  The  patient and her family understood and were appreciative of the time spent.   For questions or updates, please contact [facility] Please consult [email or web address] for contact info under Cardiology/STEMI.   Signed, Minus Breeding, MD  12/10/2017, 2:37 PM

## 2017-12-10 NOTE — Progress Notes (Signed)
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  Patient also complained of back pain.  MRI of the lumbar spine suggested epidural abscess.  Patient was transferred to [facility] and underwent lumbar laminectomy for drainage of the epidural abscess.  Reason for Visit: MSSA bacteremia.  Epidural abscess.  Endocarditis.  Consultants: Infectious disease.  Neurosurgery.  Cardiology  Procedures:   L3, L4, L5 and S1 laminectomy for drainage of lumbar epidural abscess on 7/25  Transthoracic echocardiogram Study Conclusions  - Left ventricle: The cavity size was normal. Systolic function was   normal. The estimated ejection fraction was in the range of 55%   to 60%. Wall motion was normal; there were no regional wall   motion abnormalities. Left ventricular diastolic function   parameters were normal. - Aortic valve: Transvalvular velocity was within the normal range.   There was no stenosis. There was no regurgitation. - Mitral valve: Thickening of the anterior leaflet. Transvalvular   velocity was within the normal range. There was no evidence for   stenosis. There was trivial regurgitation. - Left atrium: The atrium was mildly dilated. - Right ventricle: The cavity size was normal. Wall thickness was   normal. Systolic function was normal. - Right atrium: The atrium was severely dilated. - Tricuspid valve: There was a medium-sized, 1.0 cm (W) x 1.7 cm   (L) vegetation on the septal leaflet. There was severe   regurgitation. - Pulmonary arteries: Systolic pressure was  mildly increased. PA   peak pressure: 42 mm Hg (S).  Impressions:  - Vegetation noted on the septal leaflet of the tricuspid valve   consistent with endocarditis. Cannot exlude vegetation on the   anterior leaflet of the mitral valve. Suggest TEE to better   assess.  Antibiotics: She was on cefazolin  Started on vancomycin and ceftriaxone by neurosurgery  Changed back to cefazolin by ID  Subjective/Interval History: Patient had a lot of difficulty urinating yesterday.  A Foley catheter had to be placed.  Patient states that her back pain is reasonably well controlled.  However she is continuing to have chest discomfort especially with deep breathing.     ROS: Denies any headaches  Objective:  Vital Signs  Vitals:   12/09/17 2300 12/10/17 0300 12/10/17 0445 12/10/17 0800  BP: 135/90 130/88  132/88  Pulse: 95 96    Resp: (!) 22 (!) 24    Temp: 99.7 F (37.6 C) 100.1 F (37.8 C)  98.3 F (36.8 C)  TempSrc: Oral Oral  Oral  SpO2: 99% 90%    Weight:   91 kg (200 lb 9.9 oz) 91 kg (200 lb 9.9 oz)  Height:        Intake/Output Summary (Last 24 hours) at 12/10/2017 1047 Last data filed at 12/10/2017 0900 Gross per 24 hour  Intake 960 ml  Output 2080 ml  Net -1120 ml   Filed Weights   12/09/17 0500 12/10/17 0445 12/10/17 0800  Weight: 95.1 kg (209 lb 10.5 oz) 91 kg (200 lb 9.9 oz) 91 kg (200 lb 9.9 oz)    General appearance: Awake alert.  In no distress Resp: Diminished air  entry at the bases.  Normal effort.  Few crackles.  No wheezing. Cardio: S1-S2 is normal regular.  Mildly tachycardic today.  No S3-S4. GI: Abdomen is soft.  Mild weak tenderness diffusely without any rebound rigidity or guarding.  No obvious masses organomegaly. Extremities: Improving edema bilateral upper and lower extremities. Back: Drain is noted. Neurologic: No obvious focal neurological deficits.  Lab Results:  Data Reviewed: I have personally reviewed following labs and imaging  studies  CBC: Recent Labs  Lab 12/04/17 0538 12/05/17 0324 12/06/17 0632 12/07/17 0612 12/09/17 0354  WBC 14.9* 11.1* 11.8* 14.3* 13.3*  HGB 8.7* 7.2* 7.2* 7.5* 7.4*  HCT 27.1* 23.1* 23.8* 23.6* 23.9*  MCV 90.3 92.0 91.9 90.1 91.9  PLT 444* 419* 414* 631* 122    Basic Metabolic Panel: Recent Labs  Lab 12/06/17 0632 12/07/17 0612 12/08/17 0218 12/09/17 0354 12/10/17 0500  NA 135 133* 135 134* 130*  K 4.5 4.6 4.4 4.3 4.3  CL 103 103 104 99 94*  CO2 22 22 22 29 30   GLUCOSE 108* 108* 88 89 88  BUN 6 5* <5* 5* <5*  CREATININE 0.60 0.57 0.48 0.48 0.54  CALCIUM 8.2* 8.3* 8.1* 8.0* 8.4*  MG 1.3* 1.5* 1.6* 1.7 1.8    GFR: Estimated Creatinine Clearance: 115.9 mL/min (by C-G formula based on SCr of 0.54 mg/dL).   Recent Results (from the past 240 hour(s))  Blood culture (routine x 2)     Status: Abnormal   Collection Time: 12/02/17  2:55 PM  Result Value Ref Range Status   Specimen Description   Final    BLOOD LEFT FOOT Performed at [facility] [address], [place], [place] [address]    Special Requests   Final    BOTTLES DRAWN AEROBIC AND ANAEROBIC Blood Culture results may not be optimal due to an excessive volume of blood received in culture bottles Performed at [facility] [address] [place], [place] [address]    Culture  Setup Time   Final    IN BOTH AEROBIC AND ANAEROBIC BOTTLES GRAM POSITIVE COCCI CRITICAL RESULT CALLED TO, READ BACK BY AND VERIFIED WITH: B GREEN PHARMD 12/03/17 0532 JDW Performed at [facility], [place] [address], [place], [place] [address]    Culture STAPHYLOCOCCUS AUREUS (A)  Final   Report Status 12/05/2017 FINAL  Final   Organism ID, Bacteria STAPHYLOCOCCUS AUREUS  Final      Susceptibility   Staphylococcus aureus - MIC*    CIPROFLOXACIN <=0.5 SENSITIVE Sensitive     ERYTHROMYCIN >=8 RESISTANT Resistant     GENTAMICIN <=0.5 SENSITIVE Sensitive     OXACILLIN <=0.25 SENSITIVE  Sensitive     TETRACYCLINE 8 INTERMEDIATE Intermediate     VANCOMYCIN <=0.5 SENSITIVE Sensitive     TRIMETH/SULFA <=10 SENSITIVE Sensitive     CLINDAMYCIN <=0.25 SENSITIVE Sensitive     RIFAMPIN <=0.5 SENSITIVE Sensitive     Inducible Clindamycin NEGATIVE Sensitive     * STAPHYLOCOCCUS AUREUS  Blood Culture ID Panel (Reflexed)     Status: Abnormal   Collection Time: 12/02/17  2:55 PM  Result Value Ref Range Status   Enterococcus species NOT DETECTED NOT DETECTED Final   Listeria monocytogenes NOT DETECTED NOT DETECTED Final   Staphylococcus species DETECTED (A) NOT DETECTED Final    Comment: CRITICAL RESULT CALLED TO, READ BACK BY AND VERIFIED WITH: B GREEN PHARMD 12/03/17 0532 JDW    Staphylococcus aureus DETECTED (A) NOT DETECTED Final  Comment: Methicillin (oxacillin) susceptible Staphylococcus aureus (MSSA). Preferred therapy is anti staphylococcal beta lactam antibiotic (Cefazolin or Nafcillin), unless clinically contraindicated. CRITICAL RESULT CALLED TO, READ BACK BY AND VERIFIED WITH: B GREEN PHARMD 12/03/17 0532 JDW    Methicillin resistance NOT DETECTED NOT DETECTED Final   Streptococcus species NOT DETECTED NOT DETECTED Final   Streptococcus agalactiae NOT DETECTED NOT DETECTED Final   Streptococcus pneumoniae NOT DETECTED NOT DETECTED Final   Streptococcus pyogenes NOT DETECTED NOT DETECTED Final   Acinetobacter baumannii NOT DETECTED NOT DETECTED Final   Enterobacteriaceae species NOT DETECTED NOT DETECTED Final   Enterobacter cloacae complex NOT DETECTED NOT DETECTED Final   Escherichia coli NOT DETECTED NOT DETECTED Final   Klebsiella oxytoca NOT DETECTED NOT DETECTED Final   Klebsiella pneumoniae NOT DETECTED NOT DETECTED Final   Proteus species NOT DETECTED NOT DETECTED Final   Serratia marcescens NOT DETECTED NOT DETECTED Final   Haemophilus influenzae NOT DETECTED NOT DETECTED Final   Neisseria meningitidis NOT DETECTED NOT DETECTED Final   Pseudomonas  aeruginosa NOT DETECTED NOT DETECTED Final   Candida albicans NOT DETECTED NOT DETECTED Final   Candida glabrata NOT DETECTED NOT DETECTED Final   Candida krusei NOT DETECTED NOT DETECTED Final   Candida parapsilosis NOT DETECTED NOT DETECTED Final   Candida tropicalis NOT DETECTED NOT DETECTED Final    Comment: Performed at [facility], [place]. [address] [place], [place] [address]  Blood culture (routine x 2)     Status: Abnormal   Collection Time: 12/02/17  3:57 PM  Result Value Ref Range Status   Specimen Description   Final    BLOOD RIGHT ARM Performed at [facility] [address], [place], [place] [address]    Special Requests   Final    BOTTLES DRAWN AEROBIC AND ANAEROBIC Blood Culture adequate volume Performed at [facility] [address] [place], [place] [address]    Culture  Setup Time   Final    IN BOTH AEROBIC AND ANAEROBIC BOTTLES GRAM POSITIVE COCCI CRITICAL VALUE NOTED.  VALUE IS CONSISTENT WITH PREVIOUSLY REPORTED AND CALLED VALUE.    Culture (A)  Final    STAPHYLOCOCCUS AUREUS SUSCEPTIBILITIES PERFORMED ON PREVIOUS CULTURE WITHIN THE LAST 5 DAYS. Performed at [facility], [place] [address] [place], [place] [address]    Report Status 12/05/2017 FINAL  Final  MRSA PCR Screening     Status: None   Collection Time: 12/02/17  6:20 PM  Result Value Ref Range Status   MRSA by PCR NEGATIVE NEGATIVE Final    Comment:        The GeneXpert MRSA Assay (FDA approved for NASAL specimens only), is one component of a comprehensive MRSA colonization surveillance program. It is not intended to diagnose MRSA infection nor to guide or monitor treatment for MRSA infections. Performed at [facility], [place] [address], [place], [place] [address]   Culture, blood (routine x 2)     Status: None   Collection Time: 12/04/17  8:03 AM  Result Value Ref Range Status   Specimen Description   Final    BLOOD LEFT  ANTECUBITAL Performed at [facility] [address], [place], [place] [address]    Special Requests   Final    BOTTLES DRAWN AEROBIC ONLY Blood Culture adequate volume Performed at [facility] [address] [place],  [address]    Culture   Final    NO  GROWTH 5 DAYS Performed at [facility], [place] [address], [place], [place] [address]    Report Status 12/09/2017 FINAL  Final  Culture, blood (routine x 2)     Status: None   Collection Time: 12/04/17  8:13 AM  Result Value Ref Range Status   Specimen Description   Final    BLOOD LEFT HAND Performed at [facility] [address] [place], [place] [address]    Special Requests   Final    BOTTLES DRAWN AEROBIC ONLY Blood Culture adequate volume Performed at [facility] [address] [place], [place] [address]    Culture   Final    NO GROWTH 5 DAYS Performed at [facility], [place] [address], [place], [place] [address]    Report Status 12/09/2017 FINAL  Final  Aerobic/Anaerobic Culture (surgical/deep wound)     Status: None (Preliminary result)   Collection Time: 12/05/17  8:03 PM  Result Value Ref Range Status   Specimen Description ABSCESS  Final   Special Requests LUMBAR EPIDURAL  Final   Gram Stain   Final    ABUNDANT WBC PRESENT, PREDOMINANTLY PMN MODERATE GRAM POSITIVE COCCI Performed at [facility], [place] [address], [place], [place] [address]    Culture   Final    MODERATE STAPHYLOCOCCUS AUREUS NO ANAEROBES ISOLATED; CULTURE IN PROGRESS FOR 5 DAYS    Report Status PENDING  Incomplete   Organism ID, Bacteria STAPHYLOCOCCUS AUREUS  Final      Susceptibility   Staphylococcus aureus - MIC*    CIPROFLOXACIN <=0.5 SENSITIVE Sensitive     ERYTHROMYCIN >=8 RESISTANT Resistant     GENTAMICIN <=0.5 SENSITIVE Sensitive     OXACILLIN <=0.25 SENSITIVE Sensitive     TETRACYCLINE >=16 RESISTANT Resistant     VANCOMYCIN  <=0.5 SENSITIVE Sensitive     TRIMETH/SULFA <=10 SENSITIVE Sensitive     CLINDAMYCIN <=0.25 SENSITIVE Sensitive     RIFAMPIN <=0.5 SENSITIVE Sensitive     Inducible Clindamycin NEGATIVE Sensitive     * MODERATE STAPHYLOCOCCUS AUREUS      Radiology Studies: No results found.   Medications:  Scheduled: . bupivacaine liposome  20 mL Infiltration Once  . bupivacaine-EPINEPHrine  50 mL Infiltration Once  . Chlorhexidine Gluconate Cloth  6 each Topical Q2200  . docusate sodium  100 mg Oral BID  . guaiFENesin  1,200 mg Oral BID  . heparin  5,000 Units Subcutaneous Q8H  . lactobacillus  1 g Oral TID WC  . magnesium oxide  400 mg Oral BID  . methadone  60 mg Oral Daily  . nicotine  21 mg Transdermal Daily  . sodium chloride flush  10-40 mL Intracatheter Q12H   Continuous: . sodium chloride 10 mL/hr at 12/08/17 1003  .  ceFAZolin (ANCEF) IV 2 g (12/10/17 7416)  . lactated ringers 10 mL/hr at 12/08/17 1005   LAG:[id number] **OR** acetaminophen, bisacodyl, cyclobenzaprine, HYDROmorphone (DILAUDID) injection, lip balm, menthol-cetylpyridinium **OR** phenol, ondansetron **OR** ondansetron (ZOFRAN) IV, oxyCODONE, sodium chloride flush, zolpidem  Assessment/Plan:    MSSA bacteremia with tricuspid valve endocarditis/Sepsis/septic emboli to lungs This is most likely secondary to IV drug use.  Patient is currently on cefazolin.  ID has been following.  RPR nonreactive.  HIV nonreactive.  Hepatitis C RNA by PCR negative.  Hepatitis A and B neg. Echocardiogram suggests severe tricuspid regurgitation.  Cardiology was consulted.  They recommend completing treatment with IV antibiotics and repeat echocardiogram afterwards to determine if  she will need further intervention.  No plans for TEE currently.  Per ID she will need 8 weeks of IV cefazolin through September 18.  And may need oral treatment subsequently.  Patient is complaining of chest pain this morning.  She has had these complaints  previously as well.  We will repeat a chest x-ray as she is noted to have low-grade fever and is noted to be mildly tachycardic.  Lumbar epidural abscess Most likely secondary to above.  Patient underwent lumbar laminectomy and drainage of abscess 7/25.  Pain control.  Neurosurgery is following.  History of scoliosis status post previous surgery/chronic back pain Patient underwent MRI which showed epidural abscess as discussed above.  Patient mentioned that she takes methadone prescribed by an outpatient clinic.  [facility] prescriber database was queried and did not show any prescriptions for methadone. [facility] in [place] was called.  Apparently patient was being given 80 mg of methadone at the clinic (no prescription) starting in April and last dose was on October 25, 2017.  But patient has not returned to the clinic since that and so she has been discharged.  And since no prescription was written, this information apparently does not show up on the [facility] prescribers database. Patient was started on methadone a few days ago which will be continued mainly for pain control now.  Methadone increased to 60 mg for better pain control.    Mild acute kidney injury Resolved.  Hypomagnesemia/hyponatremia Magnesium level has improved.  Noted to be hyponatremic today.  Watch for now.    Generalized edema/anasarca Likely due to tricuspid regurgitation as well as sepsis and IV fluids.  IV fluids were stopped.  She appears to be auto diuresing.  Continue to monitor daily weights.  Strict ins and outs.  Weight appears to be decreasing.  Normocytic anemia Likely anemia of acute illness.  No evidence of overt bleeding. Platelet Counts normal. Monitor periodically.  Polysubstance use  Patient's urine drug screen on 11/21/17 was positive for cocaine opiates amphetamines barbiturates and benzodiazepine.  Patient was seen at the [facility] clinic up until October 25, 2017.  She was getting methadone 80 mg  at the clinic on a daily basis until 10/25/17.  No urine drug screen done here.  She was placed on clonidine for withdrawal symptoms and she has completed this protocol.  DVT Prophylaxis: Subcutaneous heparin    Code Status: Full code Family Communication: Discussed with the patient Disposition Plan: Management as outlined above.  PICC line was placed on 7/28.  Disposition is not clear as the patient needs long-term IV antibiotics in a supervised setting.  Follow-up on chest x-ray.    LOS: 8 days   [facility] Pager [phone] 12/10/2017, 10:47 AM  If 7PM-7AM, please contact night-coverage at [email or web address], password [facility]

## 2017-12-10 NOTE — Progress Notes (Signed)
Mom is in the room asking what Nurse can do to make the doctor come faster. She says she was called to be here when he rounds and has been "waiting for 3 hours".   Nurse sent the doctor a page verbatim of what patient mom said.   Mom is saying that "she is acting different by talking in her sleep, and when she wakes up her chest is jumping".  Patient stated an increased amount of pain in her chest to Dr. [place] Pink this morning.  A chest CT was ordered and has been completed.

## 2017-12-11 DIAGNOSIS — A4101 Sepsis due to Methicillin susceptible Staphylococcus aureus: Principal | ICD-10-CM

## 2017-12-11 DIAGNOSIS — Z5181 Encounter for therapeutic drug level monitoring: Secondary | ICD-10-CM

## 2017-12-11 DIAGNOSIS — D72829 Elevated white blood cell count, unspecified: Secondary | ICD-10-CM

## 2017-12-11 LAB — CBC
HCT: 25.7 % — ABNORMAL LOW (ref 36.0–46.0)
Hemoglobin: 8 g/dL — ABNORMAL LOW (ref 12.0–15.0)
MCH: 28.1 pg (ref 26.0–34.0)
MCHC: 31.1 g/dL (ref 30.0–36.0)
MCV: 90.2 fL (ref 78.0–100.0)
Platelets: 453 10*3/uL — ABNORMAL HIGH (ref 150–400)
RBC: 2.85 MIL/uL — ABNORMAL LOW (ref 3.87–5.11)
RDW: 14.3 % (ref 11.5–15.5)
WBC: 17.5 10*3/uL — ABNORMAL HIGH (ref 4.0–10.5)

## 2017-12-11 LAB — BASIC METABOLIC PANEL
ANION GAP: 8 (ref 5–15)
BUN: 5 mg/dL — ABNORMAL LOW (ref 6–20)
CALCIUM: 8.4 mg/dL — AB (ref 8.9–10.3)
CO2: 29 mmol/L (ref 22–32)
Chloride: 92 mmol/L — ABNORMAL LOW (ref 98–111)
Creatinine, Ser: 0.54 mg/dL (ref 0.44–1.00)
GFR calc Af Amer: >60 mL/min (ref >60)
GFR calc non Af Amer: >60 mL/min (ref >60)
GLUCOSE: 94 mg/dL (ref 70–99)
Potassium: 4.6 mmol/L (ref 3.5–5.1)
Sodium: 129 mmol/L — ABNORMAL LOW (ref 135–145)

## 2017-12-11 LAB — MAGNESIUM: Magnesium: 1.6 mg/dL — ABNORMAL LOW (ref 1.7–2.4)

## 2017-12-11 MED ORDER — HYDROMORPHONE HCL 1 MG/ML IJ SOLN
1.0000 mg | INTRAMUSCULAR | Status: DC | PRN
  Administered 2017-12-11 – 2017-12-14 (×14): 1 mg via INTRAVENOUS
  Filled 2017-12-11 (×14): qty 1

## 2017-12-11 MED ORDER — OXYCODONE HCL 5 MG PO TABS
5.0000 mg | ORAL_TABLET | ORAL | Status: DC | PRN
  Administered 2017-12-11 – 2017-12-14 (×9): 5 mg via ORAL
  Filled 2017-12-11 (×9): qty 1

## 2017-12-11 NOTE — Progress Notes (Deleted)
Progress Note  Patient Name: Kelly [name] Date of Encounter: 12/11/2017  Primary Cardiologist: No primary care provider on file.   Subjective   Pt continues to have significant back pain. Unable to walk but did transfer to chair yesterday and stayed up for 5 hours. She has mild constant shortness of breath and intermittent sharp, stabbing chest pains that often wake her from sleep.   Inpatient Medications    Scheduled Meds: . bupivacaine liposome  20 mL Infiltration Once  . bupivacaine-EPINEPHrine  50 mL Infiltration Once  . Chlorhexidine Gluconate Cloth  6 each Topical Q2200  . docusate sodium  100 mg Oral BID  . guaiFENesin  1,200 mg Oral BID  . heparin  5,000 Units Subcutaneous Q8H  . lactobacillus  1 g Oral TID WC  . magnesium oxide  400 mg Oral BID  . methadone  60 mg Oral Daily  . nicotine  21 mg Transdermal Daily  . sodium chloride flush  10-40 mL Intracatheter Q12H   Continuous Infusions: . sodium chloride 10 mL/hr at 12/08/17 1003  .  ceFAZolin (ANCEF) IV 2 g (12/11/17 0510)  . lactated ringers 10 mL/hr at 12/08/17 1005   PRN Meds: acetaminophen **OR** acetaminophen, bisacodyl, cyclobenzaprine, HYDROmorphone (DILAUDID) injection, lip balm, menthol-cetylpyridinium **OR** phenol, ondansetron **OR** ondansetron (ZOFRAN) IV, oxyCODONE, sodium chloride flush, zolpidem   Vital Signs    Vitals:   12/11/17 0500 12/11/17 0505 12/11/17 0554 12/11/17 0750  BP:  (!) 135/97  (!) 132/94  Pulse:  (!) 106  (!) 105  Resp:  (!) 29  (!) 26  Temp:   99.1 F (37.3 C) 98.6 F (37 C)  TempSrc:   Oral Oral  SpO2:  (!) 89%  92%  Weight: 201 lb 8 oz (91.4 kg)     Height:        Intake/Output Summary (Last 24 hours) at 12/11/2017 0807 Last data filed at 12/11/2017 0751 Gross per 24 hour  Intake 360 ml  Output 2300 ml  Net -1940 ml   Filed Weights   12/10/17 0445 12/10/17 0800 12/11/17 0500  Weight: 200 lb 9.9 oz (91 kg) 200 lb 9.9 oz (91 kg) 201 lb 8 oz (91.4 kg)     Telemetry    Sinus tach 100-110's - Personally Reviewed  ECG    No new tracings - Personally Reviewed  Physical Exam   GEN: Acutely ill appearing femaleNo acute distress.   Neck: No JVD Cardiac: RRR, 3/6 left mid sternal murmur, no rubs, or gallops.  Respiratory: Clear to auscultation bilaterally, decreased sounds in the bases GI: Soft, nontender, non-distended  MS: non-pitting edema of the hands and feet; No deformity. Neuro:  Nonfocal  Psych: Irritable  Labs    Chemistry Recent Labs  Lab 12/09/17 0354 12/10/17 0500 12/11/17 0445  NA 134* 130* 129*  K 4.3 4.3 4.6  CL 99 94* 92*  CO2 29 30 29   GLUCOSE 89 88 94  BUN 5* <5* 5*  CREATININE 0.48 0.54 0.54  CALCIUM 8.0* 8.4* 8.4*  GFRNONAA >60 >60 >60  GFRAA >60 >60 >60  ANIONGAP 6 6 8      Hematology Recent Labs  Lab 12/07/17 0612 12/09/17 0354 12/11/17 0445  WBC 14.3* 13.3* 17.5*  RBC 2.62* 2.60* 2.85*  HGB 7.5* 7.4* 8.0*  HCT 23.6* 23.9* 25.7*  MCV 90.1 91.9 90.2  MCH 28.6 28.5 28.1  MCHC 31.8 31.0 31.1  RDW 13.9 14.2 14.3  PLT 631* 383 453*    Cardiac EnzymesNo  results for input(s): TROPONINI in the last 168 hours. No results for input(s): TROPIPOC in the last 168 hours.   BNPNo results for input(s): BNP, PROBNP in the last 168 hours.   DDimer No results for input(s): DDIMER in the last 168 hours.   Radiology    Dg Chest Port 1 View  Result Date: 12/10/2017 CLINICAL DATA:  32 year old female with a history of dyspnea EXAM: PORTABLE CHEST 1 VIEW COMPARISON:  12/02/2017 FINDINGS: Cardiomediastinal silhouette likely unchanged in size and contour, partially obscured by overlying lung/pleural disease. Opacities at the bilateral lung bases with failed appearance, blunting the costophrenic angles and partial obscuration of the right hemidiaphragm. Complete obscuration of left hemidiaphragm and inferior left heart border. Surgical changes of the thoracolumbar spine incompletely imaged. IMPRESSION:  Worsening bilateral opacities at the lung bases, favored to represent a combination pleural effusion and atelectasis/consolidation. Electronically Signed   By: Corrie Mckusick D.O.   On: 12/10/2017 11:01    Cardiac Studies   Echocardiogram 12/03/2017 Study Conclusions - Left ventricle: The cavity size was normal. Systolic function was normal. The estimated ejection fraction was in the range of 55% to 60%. Wall motion was normal; there were no regional wall motion abnormalities. Left ventricular diastolic function parameters were normal. - Aortic valve: Transvalvular velocity was within the normal range. There was no stenosis. There was no regurgitation. - Mitral valve: Thickening of the anterior leaflet. Transvalvular velocity was within the normal range. There was no evidence for stenosis. There was trivial regurgitation. - Left atrium: The atrium was mildly dilated. - Right ventricle: The cavity size was normal. Wall thickness was normal. Systolic function was normal. - Right atrium: The atrium was severely dilated. - Tricuspid valve: There was a medium-sized, 1.0 cm (W) x 1.7 cm (L) vegetation on the septal leaflet. There was severe regurgitation. - Pulmonary arteries: Systolic pressure was mildly increased. PA peak pressure: 42 mm Hg (S).  Impressions: - Vegetation noted on the septal leaflet of the tricuspid valve consistent with endocarditis. Cannot exlude vegetation on the anterior leaflet of the mitral valve. Suggest TEE to better assess.   Patient Profile     32 y.o. female with history of polysubstance abuse (IV drug abuse), ADD, hx scoliosis post back surgery 2001 who was admitted with sepsis, PNA. Found to have tricuspid vegetation on echo.   Assessment & Plan    Endocarditis -Echo on  12/03/17 with EF 60-65%, severe TR, and  1.7 cm x 1.0 cm likely vegetation on the septal leaflet of the tricuspid valve consistent with endocarditis.  Trivial MR, thickening of the mitral valve  leaflet severe RA and mild LA enlargement.   -Plan is to complete antibiotics and then follow up in the weeks to come with repeat TTE and blood cultures per ID. Dr Percival Spanish spoke to the patient and her mother at length yesterday regarding the plan.  -Pt has mild generalized edema. Wt is stable today. Has mild constant shortness of breath, about the same as yesterday. Atypical intermittent stabbing chest pains.  -Appears stable from a cardiac standpoint. Will continue to follow along.  MSSA bacteremia -Management per Primary service and ID on IV antibiotics.  -Underwent L3, L4, L5 and S1 laminectomy for drainage of lumbar epidural abscess on 7/25 -Heart rate is up today and WBCs increased to 17.5 (13.3). TMax was 99.1 this am. CXR yesterday showed worsening bilateral opacities at the lung bases, favored to represent a combination pleural effusion and atelectasis/consolidation.     For questions  or updates, please contact [facility] Please consult [email or web address] for contact info under Cardiology/STEMI.      Signed, Daune Perch, NP  12/11/2017, 8:07 AM

## 2017-12-11 NOTE — Progress Notes (Signed)
Physical Therapy Treatment Patient Details Name: Kelly [name] MRN: [id number] DOB: [birth year] Today's Date: 12/11/2017    History of Present Illness 32 y.o. female admitted on 12-02-17 for fever and chills.  Pt dx with sepsis, tachycardia, tachypnea, leukocytosis, hypotension.  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  Pt underwent lumbarlaminectomy L3-S1 for drainage of abcess on 12-05-17.  Pt with known PMH of polysubstance abuse, ADD, scoliosis sp back surgery (20 years ago per mom).   PICC placed 12/08/17.    PT Comments    Pt admitted with above diagnosis. Pt currently with functional limitations due to the deficits listed below (see PT Problem List). Pt was able to ambulate with RW with min to mod asssist with chair follow. Only able to ambulate 4 feet at a time with chair follow.  Slow progress but is progressing.  .daw Pt will benefit from skilled PT to increase their independence and safety with mobility to allow discharge to the venue listed below.     Follow Up Recommendations  SNF     Equipment Recommendations  Wheelchair (measurements PT);Wheelchair cushion (measurements PT);3in1 (PT);Hospital bed    Recommendations for Other Services       Precautions / Restrictions Precautions Precautions: Back Precaution Booklet Issued: No Precaution Comments: pt unable to recall precuations; 3/3 precautions reviewed with pt Restrictions Weight Bearing Restrictions: No    Mobility  Bed Mobility Overal bed mobility: Needs Assistance Bed Mobility: Rolling;Sidelying to Sit Rolling: Mod assist;+2 for physical assistance;+2 for safety/equipment Sidelying to sit: +2 for physical assistance;Mod assist       General bed mobility comments: assistance needed for bending LLE and to bring bilat LE from EOB, scoot hips, and elevate  trunk into sitting; cues for sequencing and technique  Transfers Overall transfer level: Needs assistance Equipment used: Rolling walker (2 wheeled) Transfers: Sit to/from Stand Sit to Stand: Mod assist;+2 physical assistance;From elevated surface         General transfer comment: assistance required to power up into standing and to maintain upright posture upon standing; multimodal cues for hip extension and posture(performed x4 during session)  Ambulation/Gait Ambulation/Gait assistance: Min assist;+2 safety/equipment Gait Distance (Feet): 8 Feet(4 feet and then 4 feet) Assistive device: Rolling walker (2 wheeled) Gait Pattern/deviations: Step-to pattern;Decreased stride length;Shuffle;Trunk flexed   Gait velocity interpretation: <1.31 ft/sec, indicative of household ambulator General Gait Details: Pt able to take steps moving left foot first and then right with pt needing constant cues to sequence steps and RW as well as assist to move RW. Had to have seated rest break and could only walk 4 feet at a time and then LEs would feel weak and pt need to sit.    Stairs             Wheelchair Mobility    Modified Rankin (Stroke Patients Only)       Balance Overall balance assessment: Needs assistance Sitting-balance support: Feet supported;Bilateral upper extremity supported Sitting balance-Leahy Scale: Poor Sitting balance - Comments: min A to min guard EOB with bil UE support   Standing balance support: Bilateral upper extremity supported Standing balance-Leahy Scale: Poor Standing balance comment: dependent on RW                            Cognition Arousal/Alertness: Awake/alert Behavior During Therapy:  Anxious Overall Cognitive Status: Within Functional Limits for tasks assessed                                 General Comments: Pt benefits from step by step directions prior to movement      Exercises General Exercises - Lower  Extremity Ankle Circles/Pumps: AROM;Both;10 reps;Supine Long Arc Quad: AROM;Both;10 reps;Seated    General Comments General comments (skin integrity, edema, etc.): pt reports decreased sensation in bilat LE (L > R)      Pertinent Vitals/Pain Pain Assessment: Faces Faces Pain Scale: Hurts whole lot Pain Location: back and L shoulder blade Pain Descriptors / Indicators: Sore;Grimacing;Guarding;Moaning Pain Intervention(s): Limited activity within patient's tolerance;Monitored during session;Premedicated before session;Repositioned  VSS  Home Living                      Prior Function            PT Goals (current goals can now be found in the care plan section) Progress towards PT goals: Progressing toward goals    Frequency    Min 3X/week      PT Plan Current plan remains appropriate;Frequency needs to be updated    Co-evaluation              AM-PAC PT "6 Clicks" Daily Activity  Outcome Measure  Difficulty turning over in bed (including adjusting bedclothes, sheets and blankets)?: Unable Difficulty moving from lying on back to sitting on the side of the bed? : Unable Difficulty sitting down on and standing up from a chair with arms (e.g., wheelchair, bedside commode, etc,.)?: A Lot Help needed moving to and from a bed to chair (including a wheelchair)?: A Lot Help needed walking in hospital room?: A Lot Help needed climbing 3-5 steps with a railing? : Total 6 Click Score: 9    End of Session Equipment Utilized During Treatment: Gait belt Activity Tolerance: Patient limited by fatigue;Patient limited by pain Patient left: in chair;with call bell/phone within reach;with chair alarm set Nurse Communication: Mobility status PT Visit Diagnosis: Muscle weakness (generalized) (M62.81);Difficulty in walking, not elsewhere classified (R26.2);Pain;Other symptoms and signs involving the nervous system (R29.898) Pain - Right/Left: (lower back) Pain - part of  body: (back)     Time: 0973-5329 PT Time Calculation (min) (ACUTE ONLY): 23 min  Charges:  $Gait Training: 8-22 mins $Therapeutic Exercise: 8-22 mins                     [facility] [phone]   Denice Paradise 12/11/2017, 1:28 PM

## 2017-12-11 NOTE — Progress Notes (Addendum)
[facility] for Infectious Disease   Reason for visit: Follow up on endocarditis  Interval History: remains afebrile with Tmax of 99.1; WBC up some to 17.5.  Up in chair more yesterday.  No associated rash or diarrhea.   Day 10 total antibiotics Day 9 cefazolin     Physical Exam: Constitutional:  Vitals:   12/11/17 0750 12/11/17 0800  BP: (!) 132/94 (!) 132/97  Pulse: (!) 105 (!) 105  Resp: (!) 26 (!) 30  Temp: 98.6 F (37 C)   SpO2: 92% (!) 89%   patient appears in NAD, tired appearing Eyes: anicteric HENT: no thrush Respiratory: Normal respiratory effort; CTA B Cardiovascular: RRR GI: soft, nt, nd  Review of Systems: Constitutional: negative for fevers, chills and anorexia Gastrointestinal: negative for nausea and diarrhea Integument/breast: negative for rash  Lab Results  Component Value Date   WBC 17.5 (H) 12/11/2017   HGB 8.0 (L) 12/11/2017   HCT 25.7 (L) 12/11/2017   MCV 90.2 12/11/2017   PLT 453 (H) 12/11/2017    Lab Results  Component Value Date   CREATININE 0.54 12/11/2017   BUN 5 (L) 12/11/2017   NA 129 (L) 12/11/2017   K 4.6 12/11/2017   CL 92 (L) 12/11/2017   CO2 29 12/11/2017    Lab Results  Component Value Date   ALT 6 12/03/2017   AST 14 (L) 12/03/2017   ALKPHOS 211 (H) 12/03/2017     Microbiology: Recent Results (from the past 240 hour(s))  Blood culture (routine x 2)     Status: Abnormal   Collection Time: 12/02/17  2:55 PM  Result Value Ref Range Status   Specimen Description   Final    BLOOD LEFT FOOT Performed at [facility], [place] [address] [place], [place] [address]    Special Requests   Final    BOTTLES DRAWN AEROBIC AND ANAEROBIC Blood Culture results may not be optimal due to an excessive volume of blood received in culture bottles Performed at [facility] [address], [place], [place] [address]    Culture  Setup Time   Final    IN BOTH AEROBIC AND ANAEROBIC BOTTLES GRAM  POSITIVE COCCI CRITICAL RESULT CALLED TO, READ BACK BY AND VERIFIED WITH: B GREEN PHARMD 12/03/17 0532 JDW Performed at [facility], [place] [address], [place], [place] [address]    Culture STAPHYLOCOCCUS AUREUS (A)  Final   Report Status 12/05/2017 FINAL  Final   Organism ID, Bacteria STAPHYLOCOCCUS AUREUS  Final      Susceptibility   Staphylococcus aureus - MIC*    CIPROFLOXACIN <=0.5 SENSITIVE Sensitive     ERYTHROMYCIN >=8 RESISTANT Resistant     GENTAMICIN <=0.5 SENSITIVE Sensitive     OXACILLIN <=0.25 SENSITIVE Sensitive     TETRACYCLINE 8 INTERMEDIATE Intermediate     VANCOMYCIN <=0.5 SENSITIVE Sensitive     TRIMETH/SULFA <=10 SENSITIVE Sensitive     CLINDAMYCIN <=0.25 SENSITIVE Sensitive     RIFAMPIN <=0.5 SENSITIVE Sensitive     Inducible Clindamycin NEGATIVE Sensitive     * STAPHYLOCOCCUS AUREUS  Blood Culture ID Panel (Reflexed)     Status: Abnormal   Collection Time: 12/02/17  2:55 PM  Result Value Ref Range Status   Enterococcus species NOT DETECTED NOT DETECTED Final   Listeria monocytogenes NOT DETECTED NOT DETECTED Final   Staphylococcus species DETECTED (A) NOT DETECTED Final    Comment: CRITICAL RESULT CALLED TO, READ BACK BY AND VERIFIED WITH:  B GREEN PHARMD 12/03/17 0532 JDW    Staphylococcus aureus DETECTED (A) NOT DETECTED Final    Comment: Methicillin (oxacillin) susceptible Staphylococcus aureus (MSSA). Preferred therapy is anti staphylococcal beta lactam antibiotic (Cefazolin or Nafcillin), unless clinically contraindicated. CRITICAL RESULT CALLED TO, READ BACK BY AND VERIFIED WITH: B GREEN PHARMD 12/03/17 0532 JDW    Methicillin resistance NOT DETECTED NOT DETECTED Final   Streptococcus species NOT DETECTED NOT DETECTED Final   Streptococcus agalactiae NOT DETECTED NOT DETECTED Final   Streptococcus pneumoniae NOT DETECTED NOT DETECTED Final   Streptococcus pyogenes NOT DETECTED NOT DETECTED Final   Acinetobacter baumannii NOT DETECTED NOT DETECTED  Final   Enterobacteriaceae species NOT DETECTED NOT DETECTED Final   Enterobacter cloacae complex NOT DETECTED NOT DETECTED Final   Escherichia coli NOT DETECTED NOT DETECTED Final   Klebsiella oxytoca NOT DETECTED NOT DETECTED Final   Klebsiella pneumoniae NOT DETECTED NOT DETECTED Final   Proteus species NOT DETECTED NOT DETECTED Final   Serratia marcescens NOT DETECTED NOT DETECTED Final   Haemophilus influenzae NOT DETECTED NOT DETECTED Final   Neisseria meningitidis NOT DETECTED NOT DETECTED Final   Pseudomonas aeruginosa NOT DETECTED NOT DETECTED Final   Candida albicans NOT DETECTED NOT DETECTED Final   Candida glabrata NOT DETECTED NOT DETECTED Final   Candida krusei NOT DETECTED NOT DETECTED Final   Candida parapsilosis NOT DETECTED NOT DETECTED Final   Candida tropicalis NOT DETECTED NOT DETECTED Final    Comment: Performed at [facility], [place]. [address] [place], [place] [address]  Blood culture (routine x 2)     Status: Abnormal   Collection Time: 12/02/17  3:57 PM  Result Value Ref Range Status   Specimen Description   Final    BLOOD RIGHT ARM Performed at [facility] [address], [place], [place] [address]    Special Requests   Final    BOTTLES DRAWN AEROBIC AND ANAEROBIC Blood Culture adequate volume Performed at [facility] [address] [place], [place] [address]    Culture  Setup Time   Final    IN BOTH AEROBIC AND ANAEROBIC BOTTLES GRAM POSITIVE COCCI CRITICAL VALUE NOTED.  VALUE IS CONSISTENT WITH PREVIOUSLY REPORTED AND CALLED VALUE.    Culture (A)  Final    STAPHYLOCOCCUS AUREUS SUSCEPTIBILITIES PERFORMED ON PREVIOUS CULTURE WITHIN THE LAST 5 DAYS. Performed at [facility], [place] [address] [place], [place] [address]    Report Status 12/05/2017 FINAL  Final  MRSA PCR Screening     Status: None   Collection Time: 12/02/17  6:20 PM  Result Value Ref Range Status   MRSA by PCR NEGATIVE NEGATIVE  Final    Comment:        The GeneXpert MRSA Assay (FDA approved for NASAL specimens only), is one component of a comprehensive MRSA colonization surveillance program. It is not intended to diagnose MRSA infection nor to guide or monitor treatment for MRSA infections. Performed at [facility], [place] [address] [place], [place] [address]   Culture, blood (routine x 2)     Status: None   Collection Time: 12/04/17  8:03 AM  Result Value Ref Range Status   Specimen Description   Final    BLOOD LEFT ANTECUBITAL Performed at [facility] [address] [place],  [address]    Special Requests   Final    BOTTLES DRAWN AEROBIC ONLY Blood Culture adequate volume Performed at [facility], [place] [address], [place], [place] [address]    Culture   Final    NO GROWTH 5 DAYS Performed at [facility], [place] [address], [place], [place] [address]    Report Status 12/09/2017 FINAL  Final  Culture, blood (routine x 2)     Status: None   Collection Time: 12/04/17  8:13 AM  Result Value Ref Range Status   Specimen Description   Final    BLOOD LEFT HAND Performed at [facility] [address], [place], [place] [address]    Special Requests   Final    BOTTLES DRAWN AEROBIC ONLY Blood Culture adequate volume Performed at [facility] [name], [place], [place] [address]    Culture   Final    NO GROWTH 5 DAYS Performed at [facility], [place] [address], [place], [place] [address]    Report Status 12/09/2017 FINAL  Final  Aerobic/Anaerobic Culture (surgical/deep wound)     Status: None   Collection Time: 12/05/17  8:03 PM  Result Value Ref Range Status   Specimen Description ABSCESS  Final   Special Requests LUMBAR EPIDURAL  Final   Gram Stain   Final    ABUNDANT WBC PRESENT, PREDOMINANTLY PMN MODERATE GRAM POSITIVE COCCI    Culture   Final    MODERATE STAPHYLOCOCCUS  AUREUS NO ANAEROBES ISOLATED Performed at [facility], [place] [address] [place], [place] [address]    Report Status 12/10/2017 FINAL  Final   Organism ID, Bacteria STAPHYLOCOCCUS AUREUS  Final      Susceptibility   Staphylococcus aureus - MIC*    CIPROFLOXACIN <=0.5 SENSITIVE Sensitive     ERYTHROMYCIN >=8 RESISTANT Resistant     GENTAMICIN <=0.5 SENSITIVE Sensitive     OXACILLIN <=0.25 SENSITIVE Sensitive     TETRACYCLINE >=16 RESISTANT Resistant     VANCOMYCIN <=0.5 SENSITIVE Sensitive     TRIMETH/SULFA <=10 SENSITIVE Sensitive     CLINDAMYCIN <=0.25 SENSITIVE Sensitive     RIFAMPIN <=0.5 SENSITIVE Sensitive     Inducible Clindamycin NEGATIVE Sensitive     * MODERATE STAPHYLOCOCCUS AUREUS    Impression/Plan:  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  Treatment duration per #2.  I would expect her to intermittently have fever, elevations in her WBC during the course of her infection as the emboli dislodges occasionally.   At the present time though her temperature has remained normal between 98 and 99.1.  2.  Discitis - s/p surgery.  Having more movement in her legs and improving overall.  I would plan to treat this for 8 weeks through September 18.  3. Leukocytosis - WBC up today. I anticipate she will have fluctuations in her WBC during the course of her infection and as long as she is clinically responding, no change or new evaluation indicated.    4. Medication monitoring - will need weekly cbc, cmp on cefazolin.     Will follow up again on Friday

## 2017-12-11 NOTE — Social Work (Signed)
Acknowledging recommendations for SNF placement, unable to place pt while she is receiving methadone.   CSW signing off. Please consult if any additional needs arise.  Alexander Mt, [place] Work [phone]

## 2017-12-11 NOTE — Progress Notes (Signed)
PROGRESS NOTE    Kelly [name]  XNT:[id number] DOB: 1985/06/17 DOA: 12/02/2017 PCP: Patient, No Pcp Per    Brief Narrative:  32 year old female who presented with muscle pains.  She does history of IV drug abuse and scoliosis.  Ongoing symptoms for about 10 days, physical examination blood pressure 106/67, heart rate 115, temperature 98.2, respiratory rate 24.  Moist mucous membranes, lungs clear to auscultation bilaterally, heart S1-S2 present and rhythmic, abdomen soft nontender, no lower extremity edema.  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 Patient tolerating well antibiotic therapy with cefazolin. No fevers, noted leukocytosis, likely due to transient bacteremia. Will continue close monitoring. Patient is sp lumbar laminectomy and abscess drainage. Continue pain control and physical therapy.   2, AKI. Renal function has improved, will continue to avoid nephrotoxic medications and hypotension.   3. Polysubstance abuse. Will continue neuro checks per unit protocol, no clinical signs of withdrawal. Taking methadone as outpatient. Will decrease the dose of hydromorphone and oxycodone.   4. Scoliosis. Continue pain control and physical therapy evaluation/    DVT prophylaxis: enoxaparin   Code Status: full Family Communication: no family at the bedside  Disposition Plan/ discharge barriers: pending completion of IV antibiotic therapy.    Consultants:   ID  Procedures:      Antimicrobials:      Subjective: Patient is having back pain, moderate in intensity, worse with movement, no nausea or vomiting.   Objective: Vitals:   12/11/17 0554 12/11/17 0750 12/11/17 0800 12/11/17 1208  BP:  (!) 132/94 (!) 132/97 115/80  Pulse:  (!) 105 (!) 105 100  Resp:  (!) 26 (!) 30 (!) 22  Temp: 99.1 F (37.3 C) 98.6 F (37 C)  97.8 F (36.6 C)  TempSrc: Oral Oral  Oral  SpO2:  92% (!) 89% 95%  Weight:      Height:        Intake/Output Summary (Last 24 hours) at 12/11/2017 1254 Last data filed at 12/11/2017 1212 Gross per 24 hour  Intake 120 ml  Output 4425 ml  Net -4305 ml   Filed Weights   12/10/17 0445 12/10/17 0800 12/11/17 0500  Weight: 91 kg (200 lb 9.9 oz) 91 kg (200 lb 9.9 oz) 91.4 kg (201 lb 8 oz)    Examination:   General: Not in pain or dyspnea, deconditioned  Neurology: Awake and alert, non focal  E HQP:RFFM pallor, no icterus, oral mucosa moist Cardiovascular: No JVD. S1-S2 present, rhythmic, no gallops, rubs, or murmurs. No lower extremity edema. Pulmonary: decreased breath sounds bilaterally at bases, adequate air movement, no wheezing, rhonchi or rales. Gastrointestinal. Abdomen with no organomegaly, non tender, no rebound or guarding Skin. No rashes Musculoskeletal: no joint deformities     Data Reviewed: I have personally reviewed following labs and imaging studies  CBC: Recent Labs  Lab 12/05/17 0324 12/06/17 0632 12/07/17 0612 12/09/17 0354 12/11/17 0445  WBC 11.1* 11.8* 14.3* 13.3* 17.5*  HGB 7.2* 7.2* 7.5* 7.4* 8.0*  HCT 23.1* 23.8* 23.6* 23.9* 25.7*  MCV 92.0 91.9 90.1 91.9 90.2  PLT 419* 414* 631* 383 458*   Basic Metabolic Panel: Recent Labs  Lab 12/07/17 0612 12/08/17 0218 12/09/17 0354 12/10/17 0500 12/11/17 0445  NA 133* 135 134* 130* 129*  K 4.6 4.4 4.3 4.3 4.6  CL 103 104 99 94* 92*  CO2 22 22 29 30 29   GLUCOSE 108* 88 89 88 94  BUN 5* <5* 5* <5* 5*  CREATININE 0.57 0.48 0.48 0.54 0.54   CALCIUM 8.3* 8.1* 8.0* 8.4* 8.4*  MG 1.5* 1.6* 1.7 1.8 1.6*   GFR: Estimated Creatinine Clearance: 116.1 mL/min (by C-G formula based on SCr of 0.54 mg/dL). Liver Function Tests: No results for input(s): AST, ALT, ALKPHOS, BILITOT, PROT, ALBUMIN in the last 168 hours. No results for input(s): LIPASE, AMYLASE in the last 168 hours. No results for input(s): AMMONIA in the last 168 hours. Coagulation Profile: No results for input(s): INR, PROTIME in the last 168 hours. Cardiac Enzymes: No results for input(s): CKTOTAL, CKMB, CKMBINDEX, TROPONINI in the last 168 hours. BNP (last 3 results) No results for input(s): PROBNP in the last 8760 hours. HbA1C: No results for input(s): HGBA1C in the last 72 hours. CBG: No results for input(s): GLUCAP in the last 168 hours. Lipid Profile: No results for input(s): CHOL, HDL, LDLCALC, TRIG, CHOLHDL, LDLDIRECT in the last 72 hours. Thyroid Function Tests: No results for input(s): TSH, T4TOTAL, FREET4, T3FREE, THYROIDAB in the last 72 hours. Anemia Panel: No results for input(s): VITAMINB12, FOLATE, FERRITIN, TIBC, IRON, RETICCTPCT in the last 72 hours.    Radiology Studies: I have reviewed all of the imaging during this hospital visit personally     Scheduled Meds: . bupivacaine liposome  20 mL Infiltration Once  . bupivacaine-EPINEPHrine  50 mL Infiltration Once  . Chlorhexidine Gluconate Cloth  6 each Topical Q2200  . docusate sodium  100 mg Oral BID  . guaiFENesin  1,200 mg Oral BID  . heparin  5,000 Units Subcutaneous Q8H  . lactobacillus  1 g Oral TID WC  . magnesium oxide  400 mg Oral BID  . methadone  60 mg Oral Daily  . nicotine  21 mg Transdermal Daily  . sodium chloride flush  10-40 mL Intracatheter Q12H   Continuous Infusions: . sodium chloride 10 mL/hr at 12/11/17 0800  .  ceFAZolin (ANCEF) IV 2 g (12/11/17 0510)  . lactated ringers 10 mL/hr at 12/08/17 1005     LOS: 9 days        Tinea Nobile Gerome Apley,  MD [facility] Pager [phone]

## 2017-12-11 NOTE — Progress Notes (Signed)
Progress Note  Patient Name: Kelly [name] Date of Encounter: 12/11/2017  Primary Cardiologist: No primary care provider on file.   Subjective   Pt continues to have significant back pain. Unable to walk but did transfer to chair yesterday and stayed up for 5 hours. She has mild constant shortness of breath and intermittent sharp, stabbing chest pains that often wake her from sleep.   Inpatient Medications    Scheduled Meds: . bupivacaine liposome  20 mL Infiltration Once  . bupivacaine-EPINEPHrine  50 mL Infiltration Once  . Chlorhexidine Gluconate Cloth  6 each Topical Q2200  . docusate sodium  100 mg Oral BID  . guaiFENesin  1,200 mg Oral BID  . heparin  5,000 Units Subcutaneous Q8H  . lactobacillus  1 g Oral TID WC  . magnesium oxide  400 mg Oral BID  . methadone  60 mg Oral Daily  . nicotine  21 mg Transdermal Daily  . sodium chloride flush  10-40 mL Intracatheter Q12H   Continuous Infusions: . sodium chloride 10 mL/hr at 12/11/17 0800  .  ceFAZolin (ANCEF) IV 2 g (12/11/17 0510)  . lactated ringers 10 mL/hr at 12/08/17 1005   PRN Meds: acetaminophen **OR** acetaminophen, bisacodyl, cyclobenzaprine, HYDROmorphone (DILAUDID) injection, lip balm, menthol-cetylpyridinium **OR** phenol, ondansetron **OR** ondansetron (ZOFRAN) IV, oxyCODONE, sodium chloride flush, zolpidem   Vital Signs    Vitals:   12/11/17 0505 12/11/17 0554 12/11/17 0750 12/11/17 0800  BP: (!) 135/97  (!) 132/94 (!) 132/97  Pulse: (!) 106  (!) 105 (!) 105  Resp: (!) 29  (!) 26 (!) 30  Temp:  99.1 F (37.3 C) 98.6 F (37 C)   TempSrc:  Oral Oral   SpO2: (!) 89%  92% (!) 89%  Weight:      Height:        Intake/Output Summary (Last 24 hours) at 12/11/2017 0937 Last data filed at 12/11/2017 0800 Gross per 24 hour  Intake 120 ml  Output 3300 ml  Net -3180 ml   Filed Weights   12/10/17 0445 12/10/17 0800 12/11/17 0500  Weight: 200 lb 9.9 oz (91 kg) 200 lb 9.9 oz (91 kg) 201 lb 8 oz (91.4  kg)    Telemetry    Sinus tach 100-110's - Personally Reviewed  ECG    No new tracings - Personally Reviewed  Physical Exam   GEN: Acutely ill appearing femaleNo acute distress.   Neck: No JVD Cardiac: RRR, 3/6 left mid sternal murmur, no rubs, or gallops.  Respiratory: Clear to auscultation bilaterally, decreased sounds in the bases GI: Soft, nontender, non-distended  MS: non-pitting edema of the hands and feet; No deformity. Neuro:  Nonfocal  Psych: Irritable  Labs    Chemistry Recent Labs  Lab 12/09/17 0354 12/10/17 0500 12/11/17 0445  NA 134* 130* 129*  K 4.3 4.3 4.6  CL 99 94* 92*  CO2 29 30 29   GLUCOSE 89 88 94  BUN 5* <5* 5*  CREATININE 0.48 0.54 0.54  CALCIUM 8.0* 8.4* 8.4*  GFRNONAA >60 >60 >60  GFRAA >60 >60 >60  ANIONGAP 6 6 8      Hematology Recent Labs  Lab 12/07/17 0612 12/09/17 0354 12/11/17 0445  WBC 14.3* 13.3* 17.5*  RBC 2.62* 2.60* 2.85*  HGB 7.5* 7.4* 8.0*  HCT 23.6* 23.9* 25.7*  MCV 90.1 91.9 90.2  MCH 28.6 28.5 28.1  MCHC 31.8 31.0 31.1  RDW 13.9 14.2 14.3  PLT 631* 383 453*    Cardiac EnzymesNo results  for input(s): TROPONINI in the last 168 hours. No results for input(s): TROPIPOC in the last 168 hours.   BNPNo results for input(s): BNP, PROBNP in the last 168 hours.   DDimer No results for input(s): DDIMER in the last 168 hours.   Radiology    Dg Chest Port 1 View  Result Date: 12/10/2017 CLINICAL DATA:  32 year old female with a history of dyspnea EXAM: PORTABLE CHEST 1 VIEW COMPARISON:  12/02/2017 FINDINGS: Cardiomediastinal silhouette likely unchanged in size and contour, partially obscured by overlying lung/pleural disease. Opacities at the bilateral lung bases with failed appearance, blunting the costophrenic angles and partial obscuration of the right hemidiaphragm. Complete obscuration of left hemidiaphragm and inferior left heart border. Surgical changes of the thoracolumbar spine incompletely imaged. IMPRESSION:  Worsening bilateral opacities at the lung bases, favored to represent a combination pleural effusion and atelectasis/consolidation. Electronically Signed   By: Corrie Mckusick D.O.   On: 12/10/2017 11:01    Cardiac Studies   Echocardiogram 12/03/2017 Study Conclusions - Left ventricle: The cavity size was normal. Systolic function was normal. The estimated ejection fraction was in the range of 55% to 60%. Wall motion was normal; there were no regional wall motion abnormalities. Left ventricular diastolic function parameters were normal. - Aortic valve: Transvalvular velocity was within the normal range. There was no stenosis. There was no regurgitation. - Mitral valve: Thickening of the anterior leaflet. Transvalvular velocity was within the normal range. There was no evidence for stenosis. There was trivial regurgitation. - Left atrium: The atrium was mildly dilated. - Right ventricle: The cavity size was normal. Wall thickness was normal. Systolic function was normal. - Right atrium: The atrium was severely dilated. - Tricuspid valve: There was a medium-sized, 1.0 cm (W) x 1.7 cm (L) vegetation on the septal leaflet. There was severe regurgitation. - Pulmonary arteries: Systolic pressure was mildly increased. PA peak pressure: 42 mm Hg (S).  Impressions: - Vegetation noted on the septal leaflet of the tricuspid valve consistent with endocarditis. Cannot exlude vegetation on the anterior leaflet of the mitral valve. Suggest TEE to better assess.   Patient Profile     32 y.o. female with history of polysubstance abuse (IV drug abuse), ADD, hx scoliosis post back surgery 2001 who was admitted with sepsis, PNA. Found to have tricuspid vegetation on echo.   Assessment & Plan    Endocarditis -Echo on  12/03/17 with EF 60-65%, severe TR, and  1.7 cm x 1.0 cm likely vegetation on the septal leaflet of the tricuspid valve consistent with endocarditis.  Trivial MR, thickening of the mitral valve  leaflet severe RA and mild LA enlargement.   -Plan is to complete antibiotics and then follow up in the weeks to come with repeat TTE and blood cultures per ID. Dr Percival Spanish spoke to the patient and her mother at length yesterday regarding the plan.  -Pt has mild generalized edema. Wt is stable today. Has mild constant shortness of breath, about the same as yesterday. Atypical intermittent stabbing chest pains.  -Appears stable from a cardiac standpoint. Will continue to follow along.  MSSA bacteremia -Management per Primary service and ID on IV antibiotics.  -Underwent L3, L4, L5 and S1 laminectomy for drainage of lumbar epidural abscess on 7/25 -Heart rate is up today and WBCs increased to 17.5 (13.3). TMax was 99.1 this am. CXR yesterday showed worsening bilateral opacities at the lung bases, favored to represent a combination pleural effusion and atelectasis/consolidation.     For questions or  updates, please contact [facility] Please consult [email or web address] for contact info under Cardiology/STEMI.      Signed, Minus Breeding, MD  12/11/2017, 9:37 AM    History and all data above reviewed.  Patient examined.  I agree with the findings as above.  She says her back is slightly better than before.  Denies any acute complaints.   The patient exam reveals COR:RRR, murmur unchanged  ,  Lungs: Decreased breath sounds slightly at the bases  ,  Abd: Positive bowel sounds, no rebound no guarding , Ext Mild diffuse non pitting edema  .  All available labs, radiology testing, previous records reviewed. Agree with documented assessment and plan.   TV endocarditis:  No signs at this point of left sided involvement.  Exam demonstrates no new findings.  She has a low grade temp and mildly increased WBCs.  I did discuss this with Dr. Linus Salmons.  He will continue to follow the patient but no new suggestions at this point.  Plan as previously recorded.    Minus Breeding  9:37 AM  12/11/2017

## 2017-12-12 ENCOUNTER — Other Ambulatory Visit: Payer: Self-pay

## 2017-12-12 ENCOUNTER — Encounter (HOSPITAL_COMMUNITY): Payer: Self-pay

## 2017-12-12 DIAGNOSIS — Z419 Encounter for procedure for purposes other than remedying health state, unspecified: Secondary | ICD-10-CM

## 2017-12-12 DIAGNOSIS — R0600 Dyspnea, unspecified: Secondary | ICD-10-CM

## 2017-12-12 DIAGNOSIS — M791 Myalgia, unspecified site: Secondary | ICD-10-CM

## 2017-12-12 NOTE — Progress Notes (Signed)
PROGRESS NOTE    DANNYA PITKIN  OIN:[id number] DOB: [birth year] DOA: 12/02/2017 PCP: Patient, No Pcp Per    Brief Narrative:  32 year old female who presented with muscle pains.  She does history of IV drug abuse and scoliosis.  Ongoing symptoms for about 10 days, physical examination blood pressure 106/67, heart rate 115, temperature 98.2, respiratory rate 24.  Moist mucous membranes, lungs clear to auscultation bilaterally, heart S1-S2 present and rhythmic, abdomen soft nontender, no lower extremity edema.  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 Continue antibiotic therapy with IV cefazolin.  sp lumbar laminectomy and abscess drainage. Out of bed as tolerated, will remove telemetry and foley catheter.   2, AKI (resolved) with hyponatremia. Stable renal function with serum cr at 0,54, potassium at 4,6 and Na at 129.  3. Polysubstance abuse. Currently with no clinical signs of withdrawal. Continue methadone as outpatient. Taper down hydromorphone and oxycodone.   4. Scoliosis.  Physical therapy evaluation/ out of bed as tolerated   DVT prophylaxis: enoxaparin   Code Status: full Family Communication: no family at the bedside  Disposition Plan/ discharge barriers: pending completion of IV antibiotic therapy.    Consultants:   ID  Procedures:     Antimicrobials: Cefazolin IV  Subjective: Patient is feeling well, pain is well controlled, persistent weakness and  deconditioning, no nausea or vomiting.   Objective: Vitals:   12/11/17 2334 12/12/17 0331 12/12/17 0500 12/12/17 0800  BP:  124/76  108/67  Pulse:  96  94  Resp:  (!) 32  (!) 21  Temp: 99.3 F (37.4 C) 97.9 F (36.6 C)  98.8 F (37.1 C)  TempSrc: Oral Oral  Oral  SpO2:  (!) 89%  91%  Weight:   88 kg (194 lb 0.1 oz)   Height:        Intake/Output Summary (Last 24 hours) at 12/12/2017 1034 Last data filed at 12/12/2017 0803 Gross per 24 hour  Intake 240 ml  Output 4875 ml  Net -4635 ml   Filed Weights   12/10/17 0800 12/11/17 0500 12/12/17 0500  Weight: 91 kg (200 lb 9.9 oz) 91.4 kg (201 lb 8 oz) 88 kg (194 lb 0.1 oz)    Examination:   General: Not in pain or dyspnea, deconditioned  Neurology: Awake and alert, non focal  E ENT: mild pallor, no icterus, oral mucosa moist Cardiovascular: No JVD. S1-S2 present, rhythmic, no gallops, rubs, or murmurs. No lower extremity edema. Pulmonary: vesicular breath sounds bilaterally, adequate air movement, no wheezing, rhonchi or rales. Gastrointestinal. Abdomen with no organomegaly, non tender, no rebound or guarding Skin. No rashes Musculoskeletal: no joint deformities     Data Reviewed: I have personally reviewed following labs and imaging studies  CBC: Recent Labs  Lab 12/06/17 0632 12/07/17 0612 12/09/17 0354 12/11/17 0445  WBC 11.8* 14.3* 13.3* 17.5*  HGB 7.2* 7.5* 7.4* 8.0*  HCT 23.8* 23.6* 23.9* 25.7*  MCV 91.9 90.1 91.9 90.2  PLT 414* 631* 383 453*  Basic Metabolic Panel: Recent Labs  Lab 12/07/17 0612 12/08/17 0218 12/09/17 0354 12/10/17 0500 12/11/17 0445  NA 133* 135 134* 130* 129*  K 4.6 4.4 4.3 4.3 4.6  CL 103 104 99 94* 92*  CO2 22 22 29 30 29   GLUCOSE 108* 88 89 88 94  BUN 5* <5* 5* <5* 5*  CREATININE 0.57 0.48 0.48 0.54 0.54  CALCIUM 8.3* 8.1* 8.0* 8.4* 8.4*  MG 1.5* 1.6* 1.7 1.8 1.6*   GFR: Estimated Creatinine Clearance: 114 mL/min (by C-G formula based on SCr of 0.54 mg/dL). Liver  Function Tests: No results for input(s): AST, ALT, ALKPHOS, BILITOT, PROT, ALBUMIN in the last 168 hours. No results for input(s): LIPASE, AMYLASE in the last 168 hours. No results for input(s): AMMONIA in the last 168 hours. Coagulation Profile: No results for input(s): INR, PROTIME in the last 168 hours. Cardiac Enzymes: No results for input(s): CKTOTAL, CKMB, CKMBINDEX, TROPONINI in the last 168 hours. BNP (last 3 results) No results for input(s): PROBNP in the last 8760 hours. HbA1C: No results for input(s): HGBA1C in the last 72 hours. CBG: No results for input(s): GLUCAP in the last 168 hours. Lipid Profile: No results for input(s): CHOL, HDL, LDLCALC, TRIG, CHOLHDL, LDLDIRECT in the last 72 hours. Thyroid Function Tests: No results for input(s): TSH, T4TOTAL, FREET4, T3FREE, THYROIDAB in the last 72 hours. Anemia Panel: No results for input(s): VITAMINB12, FOLATE, FERRITIN, TIBC, IRON, RETICCTPCT in the last 72 hours.    Radiology Studies: I have reviewed all of the imaging during this hospital visit personally     Scheduled Meds: . bupivacaine liposome  20 mL Infiltration Once  . bupivacaine-EPINEPHrine  50 mL Infiltration Once  . Chlorhexidine Gluconate Cloth  6 each Topical Q2200  . docusate sodium  100 mg Oral BID  . guaiFENesin  1,200 mg Oral BID  . heparin  5,000 Units Subcutaneous Q8H  . lactobacillus  1 g Oral TID WC  . magnesium oxide  400 mg Oral BID  . methadone  60 mg Oral Daily  . nicotine  21 mg Transdermal Daily  . sodium chloride flush  10-40 mL Intracatheter Q12H   Continuous Infusions: . sodium chloride 10 mL/hr at 12/11/17 0800  .  ceFAZolin (ANCEF) IV 2 g (12/12/17 0518)  . lactated ringers 10 mL/hr at 12/08/17 1005     LOS: 10 days        Mauricio Gerome Apley, MD [facility] Pager [phone]

## 2017-12-12 NOTE — Plan of Care (Signed)
  Problem: Education: Goal: Knowledge of General Education information will improve Description Including pain rating scale, medication(s)/side effects and non-pharmacologic comfort measures Outcome: Progressing   Problem: Health Behavior/Discharge Planning: Goal: Ability to manage health-related needs will improve Outcome: Progressing   Problem: Clinical Measurements: Goal: Ability to maintain clinical measurements within normal limits will improve Outcome: Progressing Goal: Will remain free from infection Outcome: Progressing Goal: Diagnostic test results will improve Outcome: Progressing Goal: Respiratory complications will improve Outcome: Progressing Goal: Cardiovascular complication will be avoided Outcome: Progressing   Problem: Activity: Goal: Risk for activity intolerance will decrease Outcome: Progressing   Problem: Nutrition: Goal: Adequate nutrition will be maintained Outcome: Progressing   Problem: Coping: Goal: Level of anxiety will decrease Outcome: Not Progressing   Problem: Elimination: Goal: Will not experience complications related to bowel motility Outcome: Progressing Goal: Will not experience complications related to urinary retention Outcome: Progressing   Problem: Pain Managment: Goal: General experience of comfort will improve Outcome: Not Progressing   Problem: Skin Integrity: Goal: Risk for impaired skin integrity will decrease Outcome: Progressing

## 2017-12-12 NOTE — Progress Notes (Signed)
Patient's mom recently left going to work. Patient tearful during mom's visit. Apparently, mom is feeling patient "needs to try harder." Patient continued to state to mom.." I'm doing the best I can do." Once mom left, patient's eyes began to water and patient provided Probation officer with her background. Patient stated she became addicted to prescription drugs at the age of 16 after having back surgery. Patient stated after being on the medications for "awhile" mom pulled back from administering them. 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 Patient acknowledges being the cause of her many loss; however, she stated on two different occassions.." If she hadn't continued giving me the meds when I was young, maybe this wouldn't have happened."

## 2017-12-13 LAB — CBC WITH DIFFERENTIAL/PLATELET
ABS IMMATURE GRANULOCYTES: 0.7 10*3/uL — AB (ref 0.0–0.1)
BASOS PCT: 1 %
Basophils Absolute: 0.1 10*3/uL (ref 0.0–0.1)
Eosinophils Absolute: 0.3 10*3/uL (ref 0.0–0.7)
Eosinophils Relative: 2 %
HCT: 26.4 % — ABNORMAL LOW (ref 36.0–46.0)
Hemoglobin: 7.9 g/dL — ABNORMAL LOW (ref 12.0–15.0)
IMMATURE GRANULOCYTES: 4 %
Lymphocytes Relative: 17 %
Lymphs Abs: 2.5 10*3/uL (ref 0.7–4.0)
MCH: 27.1 pg (ref 26.0–34.0)
MCHC: 29.9 g/dL — ABNORMAL LOW (ref 30.0–36.0)
MCV: 90.7 fL (ref 78.0–100.0)
MONO ABS: 1.1 10*3/uL — AB (ref 0.1–1.0)
MONOS PCT: 7 %
NEUTROS ABS: 10.4 10*3/uL — AB (ref 1.7–7.7)
NEUTROS PCT: 69 %
PLATELETS: 543 10*3/uL — AB (ref 150–400)
RBC: 2.91 MIL/uL — ABNORMAL LOW (ref 3.87–5.11)
RDW: 14.6 % (ref 11.5–15.5)
WBC: 15 10*3/uL — ABNORMAL HIGH (ref 4.0–10.5)

## 2017-12-13 LAB — BASIC METABOLIC PANEL
Anion gap: 10 (ref 5–15)
BUN: 10 mg/dL (ref 6–20)
CALCIUM: 9.3 mg/dL (ref 8.9–10.3)
CO2: 31 mmol/L (ref 22–32)
Chloride: 90 mmol/L — ABNORMAL LOW (ref 98–111)
Creatinine, Ser: 0.56 mg/dL (ref 0.44–1.00)
GFR calc Af Amer: >60 mL/min (ref >60)
GLUCOSE: 94 mg/dL (ref 70–99)
Potassium: 5 mmol/L (ref 3.5–5.1)
SODIUM: 131 mmol/L — AB (ref 135–145)

## 2017-12-13 MED ORDER — NYSTATIN 100000 UNIT/GM EX CREA
TOPICAL_CREAM | Freq: Two times a day (BID) | CUTANEOUS | Status: DC
  Administered 2017-12-13 (×2): via TOPICAL
  Administered 2017-12-14: 1 via TOPICAL
  Administered 2017-12-14 – 2017-12-21 (×14): via TOPICAL
  Administered 2017-12-21 – 2017-12-22 (×2): 1 via TOPICAL
  Administered 2017-12-22 – 2018-01-02 (×11): via TOPICAL
  Filled 2017-12-13 (×2): qty 15

## 2017-12-13 MED ORDER — IBUPROFEN 200 MG PO TABS
200.0000 mg | ORAL_TABLET | Freq: Three times a day (TID) | ORAL | Status: DC
  Administered 2017-12-13 – 2017-12-14 (×3): 200 mg via ORAL
  Filled 2017-12-13 (×3): qty 1

## 2017-12-13 MED ORDER — PANTOPRAZOLE SODIUM 40 MG PO TBEC
40.0000 mg | DELAYED_RELEASE_TABLET | Freq: Every day | ORAL | Status: DC
  Administered 2017-12-13 – 2018-01-30 (×49): 40 mg via ORAL
  Filled 2017-12-13 (×49): qty 1

## 2017-12-13 MED ORDER — CLOTRIMAZOLE 1 % VA CREA
1.0000 | TOPICAL_CREAM | Freq: Every day | VAGINAL | Status: AC
  Administered 2017-12-13 – 2017-12-19 (×7): 1 via VAGINAL
  Filled 2017-12-13: qty 45

## 2017-12-13 MED ORDER — NALOXONE HCL 0.4 MG/ML IJ SOLN
0.4000 mg | INTRAMUSCULAR | Status: DC | PRN
  Filled 2017-12-13: qty 1

## 2017-12-13 NOTE — Progress Notes (Signed)
PROGRESS NOTE    Kelly [name]  UGQ:[id number] DOB: 10/31/1985 DOA: 12/02/2017 PCP: Patient, No Pcp Per    Brief Narrative:  32 year old female who presented with muscle pains. She does history of IV drug abuse and scoliosis. Ongoing symptoms for about 10 days, physical examination blood pressure 106/67, heart rate 115, temperature 98.2, respiratory rate 24. Moist mucous membranes, lungs clear to auscultation bilaterally, heart S1-S2 present and rhythmic, abdomen soft nontender, no lower extremity edema.  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 sp lumbar laminectomy and abscess drainage. On IV cefazolin.  Out of bed as tolerated. Continue pain control and physical therapy. Patient had urine retention and foley catheter had to be replaced.  2, AKI (resolved) with hyponatremia. Renal function has remained stable, sodium up to 131, with K at 5,0 and serum bicarbonate at 31.   3. Polysubstance abuse. No clinical sing of withdrawal. Continue pain control with methadone, hydromorphone and oxycodone, will add ibuprofen tid, add GI prophylaxis, and continue bowel regimen.  4. Scoliosis.  Continue pain control and out of bed to chair.   5. Vaginal candidiasis. Will topical clotrimazole, foley catheter for urinary retention.    DVT prophylaxis:enoxaparin Code Status:full Family Communication:no family at the bedside Disposition Plan/ discharge barriers:pending completion of IV  antibiotic therapy.   Consultants:  ID  Procedures:    Antimicrobials: Cefazolin IV    Subjective: Patient reports moderate to severe at the left lower back, worse with movement, mild improvement with analgesics, no radiation, no associated nausea or vomiting. Severe vaginal irritation, positive urinary retention today 3000 ml.   Objective: Vitals:   12/13/17 0400 12/13/17 0500 12/13/17 0751 12/13/17 1020  BP:   91/67   Pulse:   98   Resp:      Temp: 97.7 F (36.5 C)  98.6 F (37 C)   TempSrc: Oral  Oral   SpO2:   94% 94%  Weight:  83.6 kg (184 lb 4.9 oz)    Height:        Intake/Output Summary (Last 24 hours) at 12/13/2017 1120 Last data filed at 12/12/2017 1700 Gross per 24 hour  Intake 240 ml  Output 1 ml  Net 239 ml   Filed Weights   12/11/17 0500 12/12/17 0500 12/13/17 0500  Weight: 91.4 kg (201 lb 8 oz) 88 kg (194 lb 0.1 oz) 83.6 kg (184 lb 4.9 oz)    Examination:   General: deconditioned  Neurology: Awake and alert, non focal  E ENT: mild pallor, no icterus, oral mucosa moist Cardiovascular: No JVD. S1-S2 present, rhythmic, no gallops, rubs, or murmurs. No lower extremity edema. Pulmonary: vesicular breath sounds bilaterally, adequate air movement, no wheezing, rhonchi or rales. Gastrointestinal. Abdomen with no organomegaly, non tender, no rebound or guarding Skin. No rashes (reported severe vaginal irritation) Musculoskeletal: no joint deformities     Data Reviewed: I have personally reviewed following labs and imaging studies  CBC: Recent Labs  Lab 12/07/17 0612 12/09/17 0354 12/11/17 0445 12/13/17  0500  WBC 14.3* 13.3* 17.5* 15.0*  NEUTROABS  --   --   --  10.4*  HGB 7.5* 7.4* 8.0* 7.9*  HCT 23.6* 23.9* 25.7* 26.4*  MCV 90.1 91.9 90.2 90.7  PLT 631* 383 453* 443*   Basic Metabolic Panel: Recent Labs  Lab 12/07/17 0612 12/08/17 0218 12/09/17 0354 12/10/17 0500 12/11/17 0445 12/13/17 0500  NA 133* 135 134* 130* 129* 131*   K 4.6 4.4 4.3 4.3 4.6 5.0  CL 103 104 99 94* 92* 90*  CO2 22 22 29 30 29 31   GLUCOSE 108* 88 89 88 94 94  BUN 5* <5* 5* <5* 5* 10  CREATININE 0.57 0.48 0.48 0.54 0.54 0.56  CALCIUM 8.3* 8.1* 8.0* 8.4* 8.4* 9.3  MG 1.5* 1.6* 1.7 1.8 1.6*  --    GFR: Estimated Creatinine Clearance: 111.2 mL/min (by C-G formula based on SCr of 0.56 mg/dL). Liver Function Tests: No results for input(s): AST, ALT, ALKPHOS, BILITOT, PROT, ALBUMIN in the last 168 hours. No results for input(s): LIPASE, AMYLASE in the last 168 hours. No results for input(s): AMMONIA in the last 168 hours. Coagulation Profile: No results for input(s): INR, PROTIME in the last 168 hours. Cardiac Enzymes: No results for input(s): CKTOTAL, CKMB, CKMBINDEX, TROPONINI in the last 168 hours. BNP (last 3 results) No results for input(s): PROBNP in the last 8760 hours. HbA1C: No results for input(s): HGBA1C in the last 72 hours. CBG: No results for input(s): GLUCAP in the last 168 hours. Lipid Profile: No results for input(s): CHOL, HDL, LDLCALC, TRIG, CHOLHDL, LDLDIRECT in the last 72 hours. Thyroid Function Tests: No results for input(s): TSH, T4TOTAL, FREET4, T3FREE, THYROIDAB in the last 72 hours. Anemia Panel: No results for input(s): VITAMINB12, FOLATE, FERRITIN, TIBC, IRON, RETICCTPCT in the last 72 hours.    Radiology Studies: I have reviewed all of the imaging during this hospital visit personally     Scheduled Meds: . bupivacaine liposome  20 mL Infiltration Once  . bupivacaine-EPINEPHrine  50 mL Infiltration Once  . Chlorhexidine Gluconate Cloth  6 each Topical Q2200  . docusate sodium  100 mg Oral BID  . guaiFENesin  1,200 mg Oral BID  . heparin  5,000 Units Subcutaneous Q8H  . lactobacillus  1 g Oral TID WC  . magnesium oxide  400 mg Oral BID  . methadone  60 mg Oral Daily  . nicotine  21 mg Transdermal Daily  . sodium chloride flush  10-40 mL Intracatheter Q12H   Continuous Infusions: . sodium  chloride 10 mL/hr at 12/11/17 0800  .  ceFAZolin (ANCEF) IV 2 g (12/13/17 0547)  . lactated ringers 10 mL/hr at 12/08/17 1005     LOS: 11 days        Mauricio Gerome Apley, MD [facility] Pager [phone]

## 2017-12-13 NOTE — Progress Notes (Signed)
Kelly [name] for Infectious Disease   Reason for visit: Follow up on endocarditis  Interval History: remains afebrile; WBC fluctuating and 15.0.  Walked to door today. No associated rash or diarrhea.   Day 12 total antibiotics Day 11 cefazolin     Physical Exam: Constitutional:  Vitals:   12/13/17 1156 12/13/17 1200  BP: 99/65 99/65  Pulse: 100 98  Resp:    Temp: 97.7 F (36.5 C)   SpO2: 97% 94%   patient appears in NAD, up in chair Eyes: anicteric Respiratory: Normal respiratory effort; CTA B Cardiovascular: RRR GI: soft, nt, nd  Review of Systems: Constitutional: negative for fevers, chills and anorexia Gastrointestinal: negative for nausea and diarrhea Integument/breast: negative for rash  Lab Results  Component Value Date   WBC 15.0 (H) 12/13/2017   HGB 7.9 (L) 12/13/2017   HCT 26.4 (L) 12/13/2017   MCV 90.7 12/13/2017   PLT 543 (H) 12/13/2017    Lab Results  Component Value Date   CREATININE 0.56 12/13/2017   BUN 10 12/13/2017   NA 131 (L) 12/13/2017   K 5.0 12/13/2017   CL 90 (L) 12/13/2017   CO2 31 12/13/2017    Lab Results  Component Value Date   ALT 6 12/03/2017   AST 14 (L) 12/03/2017   ALKPHOS 211 (H) 12/03/2017     Microbiology: Recent Results (from the past 240 hour(s))  Culture, blood (routine x 2)     Status: None   Collection Time: 12/04/17  8:03 AM  Result Value Ref Range Status   Specimen Description   Final    BLOOD LEFT ANTECUBITAL Performed at [facility], [place] [address] [place], [place] [address]    Special Requests   Final    BOTTLES DRAWN AEROBIC ONLY Blood Culture adequate volume Performed at [facility] [address], [place], [place] [address]    Culture   Final    NO GROWTH 5 DAYS Performed at [facility], [place] [address], [place], [place] [address]    Report Status 12/09/2017 FINAL  Final  Culture, blood (routine x 2)     Status: None   Collection Time:  12/04/17  8:13 AM  Result Value Ref Range Status   Specimen Description   Final    BLOOD LEFT HAND Performed at [facility] [address], [place], [place] [address]    Special Requests   Final    BOTTLES DRAWN AEROBIC ONLY Blood Culture adequate volume Performed at [facility] [address] [place], [place] [address]    Culture   Final    NO GROWTH 5 DAYS Performed at [facility], [place] [address], [place], [place] [address]    Report Status 12/09/2017 FINAL  Final  Aerobic/Anaerobic Culture (surgical/deep wound)     Status: None   Collection Time: 12/05/17  8:03 PM  Result Value Ref Range Status   Specimen Description ABSCESS  Final   Special Requests LUMBAR EPIDURAL  Final   Gram Stain   Final    ABUNDANT WBC PRESENT, PREDOMINANTLY PMN MODERATE GRAM POSITIVE COCCI    Culture   Final    MODERATE STAPHYLOCOCCUS AUREUS NO ANAEROBES ISOLATED Performed at [facility], [place] [address], [place], [place] [address]    Report Status 12/10/2017 FINAL  Final   Organism ID, Bacteria STAPHYLOCOCCUS AUREUS  Final      Susceptibility   Staphylococcus aureus - MIC*  CIPROFLOXACIN <=0.5 SENSITIVE Sensitive     ERYTHROMYCIN >=8 RESISTANT Resistant     GENTAMICIN <=0.5 SENSITIVE Sensitive     OXACILLIN <=0.25 SENSITIVE Sensitive     TETRACYCLINE >=16 RESISTANT Resistant     VANCOMYCIN <=0.5 SENSITIVE Sensitive     TRIMETH/SULFA <=10 SENSITIVE Sensitive     CLINDAMYCIN <=0.25 SENSITIVE Sensitive     RIFAMPIN <=0.5 SENSITIVE Sensitive     Inducible Clindamycin NEGATIVE Sensitive     * MODERATE STAPHYLOCOCCUS AUREUS    Impression/Plan:  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  Treatment duration per #2.  She may have occasional fevers, increased leukocytosis.    2.  Discitis - s/p surgery.  Walking now some with assistance.  I would plan to treat this for 8 weeks through September 18.  3. Leukocytosis - WBC stable.   May fluctuate.  Can continue to monitor weekly.   4. Medication monitoring - cmp weekly on cefazolin  Dr. Baxter Flattery will follow up again next week  Dr. Tommy Medal available over the weekend if needed.

## 2017-12-13 NOTE — Progress Notes (Signed)
Pt reported feeling like her bladder was full but was unable to use purwick. This nurse assisted pt to bedside commode. When pt stood, she urinated on the floor. She sat on [facility] for 15 mins approximately without being able to void bladder.  When she stood to get back in bed she again urinated on the floor. Pt reports not being able to feel any thing in the perineal area. On call provider paged. Katherina Right RN

## 2017-12-13 NOTE — Progress Notes (Signed)
Physical Therapy Treatment Patient Details Name: Kelly [name] MRN: [id number] DOB: January 07, 1986 Today's Date: 12/13/2017    History of Present Illness 32 y.o. female admitted with sepsis, tachycardia, tachypnea, leukocytosis, hypotension.  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 PMH of polysubstance abuse, ADD, scoliosis sp back surgery (20 years ago per mom).   PICC placed 12/08/17.    PT Comments    Pt pleasant and willing to mobilize. Pt with foley placed this am with pt stating she feels better knowing that urinary incontinence won't be an issue but worried about BM. Pt with incontinent bowel during gait and assisted for pericare. Pt educated for back precautions, transfers, gait and breathing technique with ability to increase transfers and gait today with reassurance. Will continue to follow    Follow Up Recommendations  SNF;Supervision/Assistance - 24 hour     Equipment Recommendations  Rolling walker with 5" wheels;Wheelchair (measurements PT);3in1 (PT);Hospital bed    Recommendations for Other Services       Precautions / Restrictions Precautions Precautions: Back;Fall Precaution Comments: pt unable to precautions and needs cues to perform    Mobility  Bed Mobility Overal bed mobility: Needs Assistance Bed Mobility: Rolling;Sidelying to Sit Rolling: Min assist Sidelying to sit: Mod assist;+2 for safety/equipment       General bed mobility comments: cues for sequence to bend LLE with assist to roll pelvis and assist to bring legs off of bed and elevate trunk  Transfers Overall transfer level: Needs assistance   Transfers: Sit to/from Stand Sit to Stand: Mod assist         General transfer comment: mod assist to stand from bed and from recliner x 3 trials total with cues for hand placement, posture and assist to rise  Ambulation/Gait Ambulation/Gait assistance: Min assist;+2  safety/equipment Gait Distance (Feet): 12 Feet Assistive device: Rolling walker (2 wheeled) Gait Pattern/deviations: Step-to pattern;Narrow base of support;Shuffle   Gait velocity interpretation: <1.8 ft/sec, indicate of risk for recurrent falls General Gait Details: pt with very slow cautious steps with anxiety during gait requiring cues for posture, sequence and breathing techniques to prevent hyperventilation. Pt walked 12' then 10' with RW and close chair follow with seated rest and reassurance between trials   Stairs             Wheelchair Mobility    Modified Rankin (Stroke Patients Only)       Balance Overall balance assessment: Needs assistance Sitting-balance support: Bilateral upper extremity supported;Feet supported Sitting balance-Leahy Scale: Fair Sitting balance - Comments: guarding EOB with pt stating feeling unsteady due to decreased bil LE sensation     Standing balance-Leahy Scale: Poor Standing balance comment: bil UE support on RW                            Cognition Arousal/Alertness: Awake/alert Behavior During Therapy: Anxious Overall Cognitive Status: Within Functional Limits for tasks assessed                                        Exercises      General Comments        Pertinent Vitals/Pain Pain Score: 8  Pain Location: back  Pain Descriptors / Indicators: Sore;Grimacing;Guarding;Constant Pain Intervention(s): Limited activity within patient's tolerance;Repositioned;Monitored during session    Home Living  Prior Function            PT Goals (current goals can now be found in the care plan section) Progress towards PT goals: Progressing toward goals    Frequency    Min 3X/week      PT Plan Current plan remains appropriate    Co-evaluation              AM-PAC PT "6 Clicks" Daily Activity  Outcome Measure  Difficulty turning over in bed (including  adjusting bedclothes, sheets and blankets)?: Unable Difficulty moving from lying on back to sitting on the side of the bed? : Unable Difficulty sitting down on and standing up from a chair with arms (e.g., wheelchair, bedside commode, etc,.)?: Unable Help needed moving to and from a bed to chair (including a wheelchair)?: A Lot Help needed walking in hospital room?: A Lot Help needed climbing 3-5 steps with a railing? : Total 6 Click Score: 8    End of Session Equipment Utilized During Treatment: Gait belt Activity Tolerance: Patient limited by fatigue;Patient limited by pain Patient left: in chair;with call bell/phone within reach;with chair alarm set Nurse Communication: Mobility status;Precautions PT Visit Diagnosis: Muscle weakness (generalized) (M62.81);Difficulty in walking, not elsewhere classified (R26.2);Pain;Other symptoms and signs involving the nervous system (T26.712)     Time: 4580-9983 PT Time Calculation (min) (ACUTE ONLY): 33 min  Charges:  $Gait Training: 8-22 mins $Therapeutic Activity: 8-22 mins                     [address] [place]    Sandy Salaam Kruz Chiu 12/13/2017, 10:25 AM

## 2017-12-14 LAB — BASIC METABOLIC PANEL
ANION GAP: 12 (ref 5–15)
BUN: 12 mg/dL (ref 6–20)
CALCIUM: 9.2 mg/dL (ref 8.9–10.3)
CO2: 29 mmol/L (ref 22–32)
Chloride: 91 mmol/L — ABNORMAL LOW (ref 98–111)
Creatinine, Ser: 0.56 mg/dL (ref 0.44–1.00)
GLUCOSE: 137 mg/dL — AB (ref 70–99)
POTASSIUM: 4.4 mmol/L (ref 3.5–5.1)
SODIUM: 132 mmol/L — AB (ref 135–145)

## 2017-12-14 LAB — CBC WITH DIFFERENTIAL/PLATELET
ABS IMMATURE GRANULOCYTES: 0.5 10*3/uL — AB (ref 0.0–0.1)
BASOS ABS: 0.1 10*3/uL (ref 0.0–0.1)
Basophils Relative: 1 %
EOS ABS: 0.2 10*3/uL (ref 0.0–0.7)
Eosinophils Relative: 2 %
HCT: 25.5 % — ABNORMAL LOW (ref 36.0–46.0)
Hemoglobin: 7.7 g/dL — ABNORMAL LOW (ref 12.0–15.0)
Immature Granulocytes: 5 %
Lymphocytes Relative: 19 %
Lymphs Abs: 2.3 10*3/uL (ref 0.7–4.0)
MCH: 27.8 pg (ref 26.0–34.0)
MCHC: 30.2 g/dL (ref 30.0–36.0)
MCV: 92.1 fL (ref 78.0–100.0)
MONOS PCT: 8 %
Monocytes Absolute: 0.9 10*3/uL (ref 0.1–1.0)
NEUTROS ABS: 7.9 10*3/uL — AB (ref 1.7–7.7)
Neutrophils Relative %: 65 %
Platelets: 552 10*3/uL — ABNORMAL HIGH (ref 150–400)
RBC: 2.77 MIL/uL — AB (ref 3.87–5.11)
RDW: 14.6 % (ref 11.5–15.5)
WBC: 11.9 10*3/uL — AB (ref 4.0–10.5)

## 2017-12-14 MED ORDER — HYDROMORPHONE HCL 1 MG/ML IJ SOLN
0.5000 mg | INTRAMUSCULAR | Status: DC | PRN
  Administered 2017-12-14 – 2017-12-19 (×11): 0.5 mg via INTRAVENOUS
  Filled 2017-12-14 (×11): qty 1

## 2017-12-14 MED ORDER — CYCLOBENZAPRINE HCL 10 MG PO TABS
10.0000 mg | ORAL_TABLET | Freq: Three times a day (TID) | ORAL | Status: DC
  Administered 2017-12-14 – 2018-01-30 (×141): 10 mg via ORAL
  Filled 2017-12-14 (×142): qty 1

## 2017-12-14 MED ORDER — KETOROLAC TROMETHAMINE 15 MG/ML IJ SOLN
15.0000 mg | Freq: Four times a day (QID) | INTRAMUSCULAR | Status: AC | PRN
  Administered 2017-12-14 – 2017-12-19 (×17): 15 mg via INTRAVENOUS
  Filled 2017-12-14 (×17): qty 1

## 2017-12-14 MED ORDER — OXYCODONE HCL 5 MG PO TABS
5.0000 mg | ORAL_TABLET | ORAL | Status: DC | PRN
  Administered 2017-12-14 – 2017-12-18 (×10): 5 mg via ORAL
  Filled 2017-12-14 (×11): qty 1

## 2017-12-14 MED ORDER — ACETAMINOPHEN 325 MG PO TABS
650.0000 mg | ORAL_TABLET | Freq: Four times a day (QID) | ORAL | Status: DC
  Administered 2017-12-14 – 2017-12-24 (×38): 650 mg via ORAL
  Filled 2017-12-14 (×40): qty 2

## 2017-12-14 MED ORDER — LIDOCAINE 5 % EX PTCH
1.0000 | MEDICATED_PATCH | CUTANEOUS | Status: DC
  Administered 2017-12-14 – 2017-12-15 (×2): 1 via TRANSDERMAL
  Filled 2017-12-14 (×19): qty 1

## 2017-12-14 NOTE — Progress Notes (Signed)
PT requested Dilaudid at approximately 0015. It was explained that she could have some acetaminophen but that this nurse was not comfortable giving narcotics at this time. Pt then told nurse to turn off the light. This nurse turned off the light and left the room. Approximately 0045 pt stated she needed to be turned on to side needed something for pain. This nurse explained she could have the oxycodone 74m and a muscle relaxer but that this nurse was still not comfortable giving the IV pain medication d/t pt still having lower Bps. Pt agreed to this but when this nurse came back with these medications she became agitated and argumentative. It was explained again that this nurse was not comfortable giving IV pain meds especially since the on call provider had ordered Narcan for her sedation. She denied being sedated for any other reason than being tired.  This nurse left and another nurse went to pts room and administered the oral medications. Will continue to monitor. AKatherina RightRN

## 2017-12-14 NOTE — Progress Notes (Signed)
This nurse noted that pt was difficult to rouse when giving HS meds. This nurse had to physically touch pt to get her to wake up. Pt continued to have slurred speech and was clammy , gown and sheets were wet. BP still low and pt did not request any additional pain meds when receiving medication. She requested ice cream which was given at that time. On call provider paged and an order for Narcan was obtained. Katherina Right RN

## 2017-12-14 NOTE — Progress Notes (Signed)
PROGRESS NOTE    Kelly [name]  MVV:[id number] DOB: Dec 31, 1985 DOA: 12/02/2017 PCP: Patient, No Pcp Per    Brief Narrative:  32 year old female who presented with muscle pains. She does have history of IV drug abuse and scoliosis. Ongoing symptoms for about 10 days, physical examination blood pressure 106/67, heart rate 115, temperature 98.2, respiratory rate 24. Moist mucous membranes, lungs clear to auscultation bilaterally, heart S1-S2 present and rhythmic, abdomen soft nontender, no lower extremity edema.  Sodium 130, potassium 3.6, chloride 90, bicarb 27, glucose 113, BUN 23, creatinine 1.0, AST 13, ALT 6, white count 28.3, hemoglobin 9.5, hematocrit 20.7, platelets 402.  Chest x-ray with right-base atelectasis.  Hardware in thoracic spine.  Urine analysis specific gravity 1.014, 30 protein, white cells 21-50, RBCs greater than 50.  CT chest with multiple focal consolidation both lungs including multiple cavitary lesions suggesting septic emboli.  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 Pain has been not controlled, will add scheduled acetaminophen, lidocaine patch, scheduled flexeril and as needed IV hydromorphone (reduced dose) - ketorolac. Continue as needed oxycodone q 4 hours. Wbc trending down to 11,9, patient has remained afebrile.   2, AKI(resolved) with  hyponatremia.Continue to improve electrolytes with K at 4,4 and Na at 132. Patient tolerating po well, will check bmp q 48 hours.   3. Polysubstance abuse.Patient on methadone per home regimen, will try to taper off other narcotics.  4. Scoliosis.Physical therapy evaluation.   5. Vaginal candidiasis. Tolerating well topical clotrimazole and nystatin. Will keep  foley catheter for urinary retention, for now.    DVT prophylaxis:enoxaparin Code Status:full Family Communication:no family at the bedside Disposition Plan/ discharge barriers:pending completion of IV antibiotic therapy, September 18.    Consultants:  ID  Procedures:    Antimicrobials: Cefazolin IV   Subjective: Patient continue to have pain at her left lower back, worse with movement and associated with urinary retention and incontinence, foley catheter has been placed. Episodes of somnolence and hypotension.   Objective: Vitals:   12/14/17 0334 12/14/17 0500 12/14/17 0614 12/14/17 0747  BP: 90/62  (!) 88/56 95/70  Pulse: 81  77 80  Resp:      Temp:    (!) 97.5 F (36.4 C)  TempSrc:    Oral  SpO2:    100%  Weight:  84.5 kg (186 lb 4.6 oz)    Height:        Intake/Output Summary (Last 24 hours) at 12/14/2017 0953 Last data filed at 12/14/2017 0749 Gross per 24 hour  Intake 480 ml  Output 2400 ml  Net -1920 ml   Filed Weights   12/12/17 0500 12/13/17 0500 12/14/17 0500  Weight: 88 kg (194 lb 0.1 oz) 83.6 kg (184 lb 4.9 oz) 84.5 kg (186 lb 4.6 oz)    Examination:   General: deconditioned.  Neurology: Awake and alert, non focal  E ENT: mild pallor, no icterus, oral mucosa  moist Cardiovascular: No JVD. S1-S2 present, rhythmic, no gallops, rubs, or murmurs. No lower extremity edema. Pulmonary: vesicular breath sounds bilaterally, adequate air movement, no wheezing, rhonchi or rales. Gastrointestinal. Abdomen with no organomegaly, non tender, no rebound or guarding Skin. No rashes/  surgical wound is clean, no erythema or edema, no local tenderness.  Musculoskeletal: no joint deformities. Pain to palpation at the left paraspinal muscles.      Data Reviewed: I have personally reviewed following labs and imaging studies  CBC: Recent Labs  Lab 12/09/17 0354 12/11/17 0445 12/13/17 0500 12/14/17 0320  WBC 13.3* 17.5* 15.0* 11.9*  NEUTROABS  --   --  10.4* 7.9*  HGB 7.4* 8.0* 7.9* 7.7*  HCT 23.9* 25.7* 26.4* 25.5*  MCV 91.9 90.2 90.7 92.1  PLT 383 453* 543* 096*   Basic Metabolic Panel: Recent Labs  Lab 12/08/17 0218 12/09/17 0354 12/10/17 0500 12/11/17 0445 12/13/17 0500 12/14/17 0320  NA 135 134* 130* 129* 131* 132*  K 4.4 4.3 4.3 4.6 5.0 4.4  CL 104 99 94* 92* 90* 91*  CO2 22 29 30 29 31 29   GLUCOSE 88 89 88 94 94 137*  BUN <5* 5* <5* 5* 10 12  CREATININE 0.48 0.48 0.54 0.54 0.56 0.56  CALCIUM 8.1* 8.0* 8.4* 8.4* 9.3 9.2  MG 1.6* 1.7 1.8 1.6*  --   --    GFR: Estimated Creatinine Clearance: 111.8 mL/min (by C-G formula based on SCr of 0.56 mg/dL). Liver Function Tests: No results for input(s): AST, ALT, ALKPHOS, BILITOT, PROT, ALBUMIN in the last 168 hours. No results for input(s): LIPASE, AMYLASE in the last 168 hours. No results for input(s): AMMONIA in the last 168 hours. Coagulation Profile: No results for input(s): INR, PROTIME in the last 168 hours. Cardiac Enzymes: No results for input(s): CKTOTAL, CKMB, CKMBINDEX, TROPONINI in the last 168 hours. BNP (last 3 results) No results for input(s): PROBNP in the last 8760 hours. HbA1C: No results for input(s): HGBA1C in the last 72 hours. CBG: No results for input(s): GLUCAP in the last 168 hours. Lipid Profile: No results for input(s): CHOL, HDL, LDLCALC, TRIG, CHOLHDL, LDLDIRECT in the last 72 hours. Thyroid Function Tests: No results for input(s): TSH, T4TOTAL, FREET4, T3FREE, THYROIDAB in the last 72 hours. Anemia Panel: No results for input(s): VITAMINB12, FOLATE, FERRITIN, TIBC,  IRON, RETICCTPCT in the last 72 hours.    Radiology Studies: I have reviewed all of the imaging during this hospital visit personally     Scheduled Meds: . bupivacaine liposome  20 mL Infiltration Once  . bupivacaine-EPINEPHrine  50 mL Infiltration Once  . Chlorhexidine Gluconate Cloth  6 each Topical Q2200  . clotrimazole  1 Applicatorful Vaginal QHS  . docusate sodium  100 mg Oral BID  . guaiFENesin  1,200 mg Oral BID  . heparin  5,000 Units Subcutaneous Q8H  . ibuprofen  200 mg Oral TID  . lactobacillus  1 g Oral TID WC  . magnesium oxide  400 mg Oral BID  . methadone  60 mg Oral Daily  . nicotine  21 mg Transdermal Daily  . nystatin cream   Topical BID  . pantoprazole  40 mg Oral Daily  . sodium chloride flush  10-40 mL Intracatheter Q12H   Continuous Infusions: . sodium chloride 10 mL/hr at 12/11/17 0800  .  ceFAZolin (ANCEF) IV 2 g (12/14/17 0454)  . lactated ringers 10 mL/hr at 12/08/17 1005     LOS: 12 days  Akil Hoos Gerome Apley, MD [facility] Pager [phone]

## 2017-12-14 NOTE — Progress Notes (Signed)
NT reported to this nurse that pt was sweaty and bp was low. This nurse assessed pt and noted bp was low and she was sweaty. She fell asleep several times while this nurse was talking to her. Pt reported pain 5/10 at this time. Will continue to monitor. Katherina Right RN

## 2017-12-15 NOTE — Progress Notes (Signed)
PROGRESS NOTE    LETETIA ROMANELLO  OMV:[id number] DOB: Nov 23, 1985 DOA: 12/02/2017 PCP: Patient, No Pcp Per    Brief Narrative:  32 year old female who presented with muscle pains. She does have history of IV drug abuse and scoliosis. Ongoing symptoms for about 10 days, physical examination blood pressure 106/67, heart rate 115, temperature 98.2, respiratory rate 24. Moist mucous membranes, lungs clear to auscultation bilaterally, heart S1-S2 present and rhythmic, abdomen soft nontender, no lower extremity edema.  Sodium 130, potassium 3.6, chloride 90, bicarb 27, glucose 113, BUN 23, creatinine 1.0, AST 13, ALT 6, white count 28.3, hemoglobin 9.5, hematocrit 20.7, platelets 402.  Chest x-ray with right-base atelectasis.  Hardware in thoracic spine.  Urine analysis specific gravity 1.014, 30 protein, white cells 21-50, RBCs greater than 50.  CT chest with multiple focal consolidation both lungs including multiple cavitary lesions suggesting septic emboli.  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 Pain has better control, continue scheduled acetaminophen, lidocaine patch,  flexeril and as needed IV hydromorphone (reduced dose) plus ketorolac.On as needed oxycodone q 4 hours.   2, AKI(resolved) with hyponatremia.will check bmp in am. Patient tolerating po well.   3. Polysubstance abuse.Continue methadone per home regimen.  Avoid excessive narcotics   4. Scoliosis.Continue pain control and physical therapy evaluation.   5. Vaginal candidiasis. Continue topocal clotrimazole and nystatin. Foley catheter for urinary retention.   DVT prophylaxis:enoxaparin Code Status:full Family Communication:no family at the bedside Disposition Plan/ discharge barriers:pending completion of IV antibiotic therapy, September 18.    Consultants:  ID  Procedures:    Antimicrobials: Cefazolin IV   Subjective: Dyspnea has improved, back pain is moderate in intensity, more at the left lower side, worse with movement and improved with analgesics.   Objective: Vitals:   12/15/17 0340 12/15/17 0400 12/15/17 0700 12/15/17 0900  BP: 107/66     Pulse: 95 90 75   Resp:      Temp:    98.6 F (37 C)  TempSrc:      SpO2: 92% 91% (!) 87%   Weight:      Height:        Intake/Output Summary (Last 24 hours) at 12/15/2017 1338 Last data filed at 12/15/2017 0900 Gross per 24 hour  Intake 1105.22 ml  Output 1150 ml  Net -44.78 ml   Filed Weights   12/12/17 0500 12/13/17 0500 12/14/17 0500  Weight: 88 kg (194 lb 0.1 oz) 83.6 kg (184 lb 4.9 oz) 84.5 kg (186 lb 4.6 oz)    Examination:   General: Not in pain or dyspnea, deconditioned  Neurology: Awake and alert, non focal  E ENT: no pallor, no icterus, oral mucosa moist Cardiovascular: No JVD. S1-S2 present, rhythmic, no gallops, or rubs.Positive right parasternal 2/6 systolicmurmur. No lower extremity edema. Pulmonary: vesicular breath sounds bilaterally, adequate air movement, no wheezing, rhonchi or rales. Gastrointestinal. Abdomen no organomegaly, non tender, no rebound or guarding Skin. No rashes  Musculoskeletal: no joint deformities     Data Reviewed: I have personally reviewed following labs and imaging studies  CBC: Recent Labs  Lab 12/09/17 0354 12/11/17 0445 12/13/17 0500 12/14/17 0320  WBC 13.3* 17.5* 15.0* 11.9*  NEUTROABS  --    --  10.4* 7.9*  HGB 7.4* 8.0* 7.9* 7.7*  HCT 23.9* 25.7* 26.4* 25.5*  MCV 91.9 90.2 90.7 92.1  PLT 383 453* 543* 056*   Basic Metabolic Panel: Recent Labs  Lab 12/09/17 0354 12/10/17 0500 12/11/17 0445 12/13/17 0500 12/14/17 0320  NA 134* 130* 129* 131* 132*  K 4.3 4.3 4.6 5.0 4.4  CL 99 94* 92* 90* 91*  CO2 29 30 29 31 29   GLUCOSE 89 88 94 94 137*  BUN 5* <5* 5* 10 12  CREATININE 0.48 0.54 0.54 0.56 0.56  CALCIUM 8.0* 8.4* 8.4* 9.3 9.2  MG 1.7 1.8 1.6*  --   --    GFR: Estimated Creatinine Clearance: 111.8 mL/min (by C-G formula based on SCr of 0.56 mg/dL). Liver Function Tests: No results for input(s): AST, ALT, ALKPHOS, BILITOT, PROT, ALBUMIN in the last 168 hours. No results for input(s): LIPASE, AMYLASE in the last 168 hours. No results for input(s): AMMONIA in the last 168 hours. Coagulation Profile: No results for input(s): INR, PROTIME in the last 168 hours. Cardiac Enzymes: No results for input(s): CKTOTAL, CKMB, CKMBINDEX, TROPONINI in the last 168 hours. BNP (last 3 results) No results for input(s): PROBNP in the last 8760 hours. HbA1C: No results for input(s): HGBA1C in the last 72 hours. CBG: No results for input(s): GLUCAP in the last 168 hours. Lipid Profile: No results for input(s): CHOL, HDL, LDLCALC, TRIG, CHOLHDL, LDLDIRECT in the last 72 hours. Thyroid Function Tests: No results for input(s): TSH, T4TOTAL, FREET4, T3FREE, THYROIDAB in the last 72 hours. Anemia Panel: No results for input(s): VITAMINB12, FOLATE, FERRITIN, TIBC, IRON, RETICCTPCT in the last 72 hours.    Radiology Studies: I have reviewed all of the imaging during this hospital visit personally     Scheduled Meds: . acetaminophen  650 mg Oral Q6H  . bupivacaine liposome  20 mL Infiltration Once  . bupivacaine-EPINEPHrine  50 mL Infiltration Once  . Chlorhexidine Gluconate Cloth  6 each Topical Q2200  . clotrimazole  1 Applicatorful Vaginal QHS  . cyclobenzaprine  10 mg  Oral TID  . docusate sodium  100 mg Oral BID  . guaiFENesin  1,200 mg Oral BID  . heparin  5,000 Units Subcutaneous Q8H  . lactobacillus  1 g Oral TID WC  . lidocaine  1 patch Transdermal Q24H  . magnesium oxide  400 mg Oral BID  . methadone  60 mg Oral Daily  . nicotine  21 mg Transdermal Daily  . nystatin cream   Topical BID  . pantoprazole  40 mg Oral Daily  . sodium chloride flush  10-40 mL Intracatheter Q12H   Continuous Infusions: .  ceFAZolin (ANCEF) IV 2 g (12/15/17 0547)  . lactated ringers 10 mL/hr at 12/14/17 1839     LOS: 13 days        Deakin Lacek Gerome Apley, MD [facility] Pager [phone]

## 2017-12-16 DIAGNOSIS — R7881 Bacteremia: Secondary | ICD-10-CM

## 2017-12-16 LAB — CBC WITH DIFFERENTIAL/PLATELET
Abs Immature Granulocytes: 0.1 10*3/uL (ref 0.0–0.1)
Basophils Absolute: 0.1 10*3/uL (ref 0.0–0.1)
Basophils Relative: 1 %
EOS PCT: 1 %
Eosinophils Absolute: 0.1 10*3/uL (ref 0.0–0.7)
HCT: 25.1 % — ABNORMAL LOW (ref 36.0–46.0)
HEMOGLOBIN: 7.9 g/dL — AB (ref 12.0–15.0)
Immature Granulocytes: 1 %
LYMPHS ABS: 2.2 10*3/uL (ref 0.7–4.0)
LYMPHS PCT: 22 %
MCH: 27.9 pg (ref 26.0–34.0)
MCHC: 31.5 g/dL (ref 30.0–36.0)
MCV: 88.7 fL (ref 78.0–100.0)
MONO ABS: 0.7 10*3/uL (ref 0.1–1.0)
MONOS PCT: 7 %
Neutro Abs: 6.8 10*3/uL (ref 1.7–7.7)
Neutrophils Relative %: 68 %
PLATELETS: 616 10*3/uL — AB (ref 150–400)
RBC: 2.83 MIL/uL — ABNORMAL LOW (ref 3.87–5.11)
RDW: 14.2 % (ref 11.5–15.5)
WBC: 10 10*3/uL (ref 4.0–10.5)

## 2017-12-16 LAB — BASIC METABOLIC PANEL
Anion gap: 10 (ref 5–15)
BUN: 10 mg/dL (ref 6–20)
CHLORIDE: 92 mmol/L — AB (ref 98–111)
CO2: 31 mmol/L (ref 22–32)
CREATININE: 0.61 mg/dL (ref 0.44–1.00)
Calcium: 9.5 mg/dL (ref 8.9–10.3)
Glucose, Bld: 101 mg/dL — ABNORMAL HIGH (ref 70–99)
POTASSIUM: 5.1 mmol/L (ref 3.5–5.1)
Sodium: 133 mmol/L — ABNORMAL LOW (ref 135–145)

## 2017-12-16 NOTE — Progress Notes (Signed)
[facility] for Infectious Disease  Date of Admission:  12/02/2017   Total days of antibiotics 15        Day 14 Cefazolin           Patient ID: Kelly [name] is a 32 y.o. 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 acetaminophen  650 mg Oral Q6H  . bupivacaine liposome  20 mL Infiltration Once  . bupivacaine-EPINEPHrine  50 mL Infiltration Once  . Chlorhexidine Gluconate Cloth  6 each Topical Q2200  . clotrimazole  1 Applicatorful Vaginal QHS  . cyclobenzaprine  10 mg Oral TID  . docusate sodium  100 mg Oral BID  . guaiFENesin  1,200 mg Oral BID  . heparin  5,000 Units Subcutaneous Q8H  . lactobacillus  1 g Oral TID WC  . lidocaine  1 patch Transdermal Q24H  . magnesium oxide  400 mg Oral BID  . methadone  60 mg Oral Daily  . nicotine  21 mg Transdermal Daily  . nystatin cream   Topical BID  . pantoprazole  40 mg Oral Daily  . sodium chloride flush  10-40 mL Intracatheter Q12H    SUBJECTIVE: Afebrile, denies chills, nightsweat,eating well. No diarrhea. Has some back pain.  Allergies  Allergen Reactions  . Hydrocodone Itching  . Morphine And Related Nausea And Vomiting    OBJECTIVE: Vitals:   12/16/17 0300 12/16/17 0845 12/16/17 1100 12/16/17 1208  BP: 114/83   105/84  Pulse: 96 74  68  Resp:      Temp: 97.9 F (36.6 C) 98.5 F (36.9 C) 98 F (36.7 C) 97.8 F (36.6 C)  TempSrc: Oral   Axillary  SpO2: 94% 99%  93%  Weight:      Height:       Body mass index is 29.32 kg/m. Physical Exam  Constitutional:  oriented to person, place, and time. appears well-developed and well-nourished. No  distress.  HENT: [place]/AT, PERRLA, no scleral icterus Mouth/Throat: Oropharynx is clear and moist. No oropharyngeal exudate.  Cardiovascular: Normal rate, regular rhythm and normal heart sounds. +1/0 holosystolic murmur BH USB Pulmonary/Chest: Effort normal and breath sounds normal. No respiratory distress.  has no wheezes.  Neck = supple, no nuchal rigidity Abdominal: Soft. Bowel sounds are normal.  exhibits no distension. There is no tenderness.  Lymphadenopathy: no cervical adenopathy. No axillary adenopathy Neurological: alert and oriented to person, place, and time.  Skin: Skin is warm and dry. No rash noted. No erythema.  Psychiatric: a normal mood and affect.  behavior is normal.    Lab Results Lab Results  Component Value Date   WBC 10.0 12/16/2017   HGB 7.9 (L) 12/16/2017   HCT 25.1 (L) 12/16/2017   MCV 88.7 12/16/2017   PLT 616 (H) 12/16/2017    Lab Results  Component Value Date   CREATININE 0.61 12/16/2017   BUN 10 12/16/2017   NA 133 (L) 12/16/2017   K 5.1 12/16/2017   CL 92 (L) 12/16/2017   CO2 31 12/16/2017    Lab Results  Component Value Date   ALT 6  12/03/2017   AST 14 (L) 12/03/2017   ALKPHOS 211 (H) 12/03/2017   BILITOT 0.8 12/03/2017     Microbiology: No results found for this or any previous visit (from the past 240 hour(s)).  Sed Rate (mm/hr)  Date Value  12/02/2017 136 (H)   No results found for: CRP   ASSESSMENT: 32 y.o. female with MSSA endocarditis involving her tricuspid and likely mitral valves complicated by lumbar epidural abscess s/p laminectomy 7/25.   PLAN: 1. Tricuspid, Mitral Valve Endocarditis = verified on TTE. No signs of severe valvular dysfunction. Will need cardiology follow up at end of treatment.   2. L Spine Discitis/Osteomyelitis with epidural abscess = now s/p evacuation on 7/25. Will need prolonged course of treatment for 8 weeks to treat this infection with end date 01/29/18.   3. Medication monitoring = CMP and CBC  weekly. WBC stable. Will check ESR/CRP with next lab draw to follow clinical course.   Will touch base weekly with her while she under goes treatment.   Elzie Rings [facility] [phone]   12/16/2017  3:30 PM

## 2017-12-16 NOTE — Progress Notes (Signed)
Occupational Therapy Treatment Patient Details Name: Kelly [name] MRN: [id number] DOB: Dec 03, 1985 Today's Date: 12/16/2017    History of present illness 32 y.o. female admitted with sepsis, tachycardia, tachypnea, leukocytosis, hypotension.  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 PMH of polysubstance abuse, ADD, scoliosis sp back surgery (20 years ago per mom).   PICC placed 12/08/17.   OT comments  Pt progressing towards OT goals this session with improved ability to tolerate and participate in transfers. Today Pt able to perform transfer to [facility] with min A +2. Pt concerned about BM regiment and "not being able to feel when she has to go" educated on anal stimulation while sitting on the [facility] and Pt able to perform with warm wash cloth. Pt eager to participate in ambulation with PT/OT. Pt educated on chair/bed level exercises that she can participate in. Going forward making a schedule with PT to maximize her therapy while she remains in the acute setting.   Follow Up Recommendations  SNF;Supervision/Assistance - 24 hour    Equipment Recommendations  3 in 1 bedside commode;Wheelchair (measurements OT);Hospital bed;Wheelchair cushion (measurements OT)    Recommendations for Other Services      Precautions / Restrictions Precautions Precautions: Back;Fall Restrictions Weight Bearing Restrictions: No       Mobility Bed Mobility Overal bed mobility: Needs Assistance Bed Mobility: Rolling;Sidelying to Sit Rolling: Min assist Sidelying to sit: Mod assist       General bed mobility comments: cues for sequence to bend LLE with assist to roll pelvis and assist to bring legs off of bed and elevate trunk  Transfers Overall transfer level: Needs assistance Equipment used: Rolling walker (2 wheeled)   Sit to Stand: Min assist         General transfer comment: min A for boost, vc for safe hand  placement     Balance Overall balance assessment: Needs assistance Sitting-balance support: Bilateral upper extremity supported;Feet supported Sitting balance-Leahy Scale: Fair Sitting balance - Comments: min guard EOB   Standing balance support: Bilateral upper extremity supported Standing balance-Leahy Scale: Poor Standing balance comment: bil UE support on RW                           ADL either performed or assessed with clinical judgement   ADL Overall ADL's : Needs assistance/impaired                     Lower Body Dressing: Total assistance Lower Body Dressing Details (indicate cue type and reason): to don socks at bed level prior to [facility] transfer Toilet Transfer: Minimal assistance;+2 for physical assistance;+2 for safety/equipment;Stand-pivot;RW;BSC Toilet Transfer Details (indicate cue type and reason): min A for boost from elevated bed, improved ability to pivot Toileting- Clothing Manipulation and Hygiene: Set up;Sitting/lateral lean Toileting - Clothing Manipulation Details (indicate cue type and reason): able to reach around with warm wash cloth and stimulate anus for attempt at [facility]     Functional mobility during ADLs: Minimal assistance;+2 for safety/equipment;Rolling walker(chair follow, x3 seated rest break)       Vision       Perception     Praxis      Cognition Arousal/Alertness: Awake/alert Behavior During Therapy: Anxious Overall Cognitive Status: Within Functional Limits for tasks assessed  General Comments: improved anxiety this session        Exercises Exercises: Other exercises Other Exercises Other Exercises: educated on chair push ups   Shoulder Instructions       General Comments      Pertinent Vitals/ Pain       Pain Assessment: Faces Faces Pain Scale: Hurts little more Pain Location: back  Pain Descriptors / Indicators: Grimacing;Sore Pain Intervention(s):  Monitored during session;Repositioned;Premedicated before session  Home Living                                          Prior Functioning/Environment              Frequency  Min 2X/week        Progress Toward Goals  OT Goals(current goals can now be found in the care plan section)  Progress towards OT goals: Progressing toward goals  Acute Rehab OT Goals Patient Stated Goal: "get stronger and less pain" OT Goal Formulation: With patient/family Time For Goal Achievement: 12/20/17 Potential to Achieve Goals: Good  Plan Discharge plan remains appropriate;Frequency remains appropriate    Co-evaluation    PT/OT/SLP Co-Evaluation/Treatment: Yes Reason for Co-Treatment: Complexity of the patient's impairments (multi-system involvement);For patient/therapist safety;To address functional/ADL transfers PT goals addressed during session: Mobility/safety with mobility;Balance;Proper use of DME;Strengthening/ROM OT goals addressed during session: ADL's and self-care;Proper use of Adaptive equipment and DME      AM-PAC PT "6 Clicks" Daily Activity     Outcome Measure   Help from another person eating meals?: None Help from another person taking care of personal grooming?: A Little Help from another person toileting, which includes using toliet, bedpan, or urinal?: A Lot Help from another person bathing (including washing, rinsing, drying)?: A Lot Help from another person to put on and taking off regular upper body clothing?: A Little Help from another person to put on and taking off regular lower body clothing?: Total 6 Click Score: 15    End of Session Equipment Utilized During Treatment: Rolling walker;Gait belt  OT Visit Diagnosis: Unsteadiness on feet (R26.81);Other abnormalities of gait and mobility (R26.89);Muscle weakness (generalized) (M62.81);Pain Pain - part of body: (back)   Activity Tolerance Patient tolerated treatment well   Patient Left in  chair;with call bell/phone within reach;with chair alarm set;with family/visitor present   Nurse Communication Mobility status;Precautions;Other (comment)(Pt would like to take a bath and prefers cotton gown)        Time: 9458-5929 OT Time Calculation (min): 57 min  Charges: OT General Charges $OT Visit: 1 Visit OT Treatments $Self Care/Home Management : 8-22 mins $Therapeutic Activity: 8-22 mins  Hulda Humphrey OTR/L [place] 12/16/2017, 6:09 PM

## 2017-12-16 NOTE — Progress Notes (Signed)
PROGRESS NOTE    Kelly [name]  ITG:[id number] DOB: 1985-08-17 DOA: 12/02/2017 PCP: Patient, No Pcp Per    Brief Narrative:  32 year old female who presented with muscle pains. She does havehistory of IV drug abuse and scoliosis. Ongoing symptoms for about 10 days, physical examination blood pressure 106/67, heart rate 115, temperature 98.2, respiratory rate 24. Moist mucous membranes, lungs clear to auscultation bilaterally, heart S1-S2 present and rhythmic, abdomen soft nontender, no lower extremity edema. Sodium 130, potassium 3.6, chloride 90, bicarb 27, glucose 113, BUN 23, creatinine 1.0, AST 13, ALT 6, white count 28.3, hemoglobin 9.5, hematocrit 20.7, platelets 402.Chest x-ray with right-baseatelectasis. Hardware in thoracic spine. Urine analysis specific gravity 1.014, 30 protein, white cells 21-50, RBCs greater than 50. CT chest with multiple focal consolidation both lungs including multiple cavitary lesions suggesting septic emboli.  Patient was admitted to the hospital with the working diagnosis of sepsis  Assessment & Plan:   Active Problems:   Cocaine abuse (HCC)   Polysubstance abuse (HCC)   Sepsis (HCC)   Febrile illness   Difficult intravenous access   Leukocytosis   Intravenous drug abuse ([place])   AKI (acute kidney injury) ([place])   Abscess in epidural space of L2-L5 lumbar spine   Acute bacterial endocarditis   1. 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 Physical therapy as tolerated.   2, AKI(resolved) with hyponatremia.Stable renal function with serum cr at 0,61 with K ar 5,1 and serum  bicarbonate at 31. Will continue to follow renal function while on antibiotic therapy.   3. Polysubstance abuse.On methadone per home regimen. Continue to avoid excessive narcotics   4. Scoliosis. physical therapy evaluation, out of bed to the chair daily.   5. Vaginal candidiasis. On topocal clotrimazoleand nystatin. Continue Foley catheter for urinary retention, for now. Plan for voiding trial in a few days.   DVT prophylaxis:enoxaparin Code Status:full Family Communication:no family at the bedside Disposition Plan/ discharge barriers:pending completion of IV antibiotic therapy, September 18.   Consultants:  ID  Procedures:    Antimicrobials: Cefazolin IV     Subjective: Patient is feeling pain at the lower back, intermittent, and improved with analgesics , worse with movement.   Objective: Vitals:   12/15/17 2300 12/16/17 0300 12/16/17 0845 12/16/17 1100  BP: 110/72 114/83    Pulse: 89 96    Resp:      Temp: 98.9 F (37.2 C) 97.9 F (36.6 C) 98.5 F (36.9 C) 98 F (36.7 C)  TempSrc: Oral Oral    SpO2: 97% 94%    Weight:      Height:        Intake/Output Summary (Last 24 hours) at 12/16/2017 1251 Last data filed at 12/16/2017 1100 Gross per 24 hour  Intake 1090 ml  Output 3625 ml  Net -2535 ml   Filed Weights   12/13/17 0500 12/14/17 0500 12/15/17 0800  Weight: 83.6 kg (184 lb 4.9 oz) 84.5 kg (186 lb 4.6 oz) 84.9 kg (187 lb 2.7 oz)    Examination:   General: Not in pain or dyspnea, deconditioned  Neurology: Awake and alert, non focal  E ENT: no pallor, no icterus, oral mucosa moist Cardiovascular: No JVD. S1-S2 present, rhythmic, no gallops, or  rubs, 3/6 systolic  Murmurs at the right sternal border. No lower extremity edema. Pulmonary: vesicular breath sounds bilaterally, adequate air movement, no wheezing, rhonchi or rales. Gastrointestinal. Abdomen with, no organomegaly, non tender, no rebound or guarding Skin. No  rashes Musculoskeletal: no joint deformities     Data Reviewed: I have personally reviewed following labs and imaging studies  CBC: Recent Labs  Lab 12/11/17 0445 12/13/17 0500 12/14/17 0320 12/16/17 0450  WBC 17.5* 15.0* 11.9* 10.0  NEUTROABS  --  10.4* 7.9* 6.8  HGB 8.0* 7.9* 7.7* 7.9*  HCT 25.7* 26.4* 25.5* 25.1*  MCV 90.2 90.7 92.1 88.7  PLT 453* 543* 552* 480*   Basic Metabolic Panel: Recent Labs  Lab 12/10/17 0500 12/11/17 0445 12/13/17 0500 12/14/17 0320 12/16/17 0450  NA 130* 129* 131* 132* 133*  K 4.3 4.6 5.0 4.4 5.1  CL 94* 92* 90* 91* 92*  CO2 30 29 31 29 31   GLUCOSE 88 94 94 137* 101*  BUN <5* 5* 10 12 10   CREATININE 0.54 0.54 0.56 0.56 0.61  CALCIUM 8.4* 8.4* 9.3 9.2 9.5  MG 1.8 1.6*  --   --   --    GFR: Estimated Creatinine Clearance: 112 mL/min (by C-G formula based on SCr of 0.61 mg/dL). Liver Function Tests: No results for input(s): AST, ALT, ALKPHOS, BILITOT, PROT, ALBUMIN in the last 168 hours. No results for input(s): LIPASE, AMYLASE in the last 168 hours. No results for input(s): AMMONIA in the last 168 hours. Coagulation Profile: No results for input(s): INR, PROTIME in the last 168 hours. Cardiac Enzymes: No results for input(s): CKTOTAL, CKMB, CKMBINDEX, TROPONINI in the last 168 hours. BNP (last 3 results) No results for input(s): PROBNP in the last 8760 hours. HbA1C: No results for input(s): HGBA1C in the last 72 hours. CBG: No results for input(s): GLUCAP in the last 168 hours. Lipid Profile: No results for input(s): CHOL, HDL, LDLCALC, TRIG, CHOLHDL, LDLDIRECT in the last 72 hours. Thyroid Function Tests: No results for input(s): TSH, T4TOTAL, FREET4, T3FREE, THYROIDAB in the last 72 hours. Anemia Panel: No results for input(s): VITAMINB12, FOLATE, FERRITIN, TIBC, IRON, RETICCTPCT in the last 72 hours.    Radiology Studies: I have reviewed all of the imaging during this hospital visit personally     Scheduled Meds: .  acetaminophen  650 mg Oral Q6H  . bupivacaine liposome  20 mL Infiltration Once  . bupivacaine-EPINEPHrine  50 mL Infiltration Once  . Chlorhexidine Gluconate Cloth  6 each Topical Q2200  . clotrimazole  1 Applicatorful Vaginal QHS  . cyclobenzaprine  10 mg Oral TID  . docusate sodium  100 mg Oral BID  . guaiFENesin  1,200 mg Oral BID  . heparin  5,000 Units Subcutaneous Q8H  . lactobacillus  1 g Oral TID WC  . lidocaine  1 patch Transdermal Q24H  . magnesium oxide  400 mg Oral BID  . methadone  60 mg Oral Daily  . nicotine  21 mg Transdermal Daily  . nystatin cream   Topical BID  . pantoprazole  40 mg Oral Daily  . sodium chloride flush  10-40 mL Intracatheter Q12H   Continuous Infusions: .  ceFAZolin (ANCEF) IV 2 g (12/16/17 0519)  . lactated ringers 10 mL/hr at 12/15/17 2148     LOS: 14 days        Mauricio Gerome Apley, MD [facility] Pager [phone]

## 2017-12-16 NOTE — Progress Notes (Signed)
Physical Therapy Treatment Patient Details Name: Kelly [name] MRN: [id number] DOB: 04-Jul-1985 Today's Date: 12/16/2017    History of Present Illness 32 y.o. female admitted with sepsis, tachycardia, tachypnea, leukocytosis, hypotension.  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 PMH of polysubstance abuse, ADD, scoliosis sp back surgery (20 years ago per mom).   PICC placed 12/08/17.    PT Comments    Pt is progressing daily with gait and mobility.  She is now able to be a one person physical assist, but will still likely do better with a chair to follow to continue to progress gait distance and decrease anxiety during gait.  We also stopped on the [facility] to make sure bowel was empty prior to gait, she has no sensation of needing to go and no control over her bowels.  Urinary catheter is still in to assist bladder.    Follow Up Recommendations  CIR     Equipment Recommendations  Rolling walker with 5" wheels;3in1 (PT)    Recommendations for Other Services   NA     Precautions / Restrictions Precautions Precautions: Back;Fall Precaution Comments: Reviewed log roll technique Restrictions Weight Bearing Restrictions: No    Mobility  Bed Mobility Overal bed mobility: Needs Assistance Bed Mobility: Rolling;Sidelying to Sit Rolling: Min assist Sidelying to sit: Mod assist       General bed mobility comments: cues for sequence to bend LLE with assist to roll pelvis and assist to bring legs off of bed and elevate trunk.  Has most difficulty flexing L knee and hip to prepare for log roll compared to R.   Transfers Overall transfer level: Needs assistance Equipment used: Rolling walker (2 wheeled)   Sit to Stand: Min assist;+2 safety/equipment         General transfer comment: min A for boost trunk up over weak legs from various height surfaces (bed, BSC, recliner chair), vc for safe hand  placement   Ambulation/Gait Ambulation/Gait assistance: +2 safety/equipment;Min assist Gait Distance (Feet): 20 Feet(x3 with seated rest breaks between) Assistive device: Rolling walker (2 wheeled) Gait Pattern/deviations: Step-through pattern;Shuffle     General Gait Details: less noticeable ankle instability this session, chair to follow for safety and to increase confidence and encourage increased gait distance.           Balance Overall balance assessment: Needs assistance Sitting-balance support: Bilateral upper extremity supported;Feet supported Sitting balance-Leahy Scale: Fair Sitting balance - Comments: min guard EOB   Standing balance support: Bilateral upper extremity supported Standing balance-Leahy Scale: Poor Standing balance comment: bil UE support on RW and support from therapist while up                            Cognition Arousal/Alertness: Awake/alert Behavior During Therapy: Anxious Overall Cognitive Status: Within Functional Limits for tasks assessed                                 General Comments: improved anxiety this session      Exercises General Exercises - Lower Extremity Ankle Circles/Pumps: AROM;Both Quad Sets: Both;AROM Heel Slides: AROM;Both Other Exercises Other Exercises: Encouraged pt to do the following exercises throughout the day (see above).    General Comments General comments (skin integrity, edema, etc.): Pt's mom wondering if shoes would feel better during gait.  I told  her we could try that if she brought them in at some point.       Pertinent Vitals/Pain Pain Assessment: Faces Faces Pain Scale: Hurts little more Pain Location: back  Pain Descriptors / Indicators: Grimacing;Sore Pain Intervention(s): Limited activity within patient's tolerance;Monitored during session;Repositioned;RN gave pain meds during session           PT Goals (current goals can now be found in the care plan section)  Acute Rehab PT Goals Patient Stated Goal: "get stronger and less pain" Progress towards PT goals: Progressing toward goals    Frequency    Min 3X/week      PT Plan Current plan remains appropriate    Co-evaluation PT/OT/SLP Co-Evaluation/Treatment: Yes Reason for Co-Treatment: Complexity of the patient's impairments (multi-system involvement);For patient/therapist safety;To address functional/ADL transfers PT goals addressed during session: Mobility/safety with mobility;Balance;Strengthening/ROM;Proper use of DME        AM-PAC PT "6 Clicks" Daily Activity  Outcome Measure  Difficulty turning over in bed (including adjusting bedclothes, sheets and blankets)?: Unable Difficulty moving from lying on back to sitting on the side of the bed? : Unable Difficulty sitting down on and standing up from a chair with arms (e.g., wheelchair, bedside commode, etc,.)?: Unable Help needed moving to and from a bed to chair (including a wheelchair)?: A Little Help needed walking in hospital room?: A Little Help needed climbing 3-5 steps with a railing? : A Lot 6 Click Score: 11    End of Session   Activity Tolerance: Patient limited by fatigue;Patient limited by pain Patient left: in chair;with call bell/phone within reach;with family/visitor present   PT Visit Diagnosis: Muscle weakness (generalized) (M62.81);Difficulty in walking, not elsewhere classified (R26.2);Pain;Other symptoms and signs involving the nervous system (R29.898) Pain - Right/Left: (lower) Pain - part of body: (back)     Time: 1027-2536 PT Time Calculation (min) (ACUTE ONLY): 57 min  Charges:  $Gait Training: 8-22 mins $Therapeutic Activity: 8-22 mins                    Antionette Luster B. [place], [place], DPT [phone]   12/16/2017, 11:08 PM

## 2017-12-17 LAB — SEDIMENTATION RATE: Sed Rate: >140 mm/hr — ABNORMAL HIGH (ref 0–22)

## 2017-12-17 LAB — C-REACTIVE PROTEIN: CRP: 6.5 mg/dL — AB (ref <1.0)

## 2017-12-17 NOTE — Care Management Note (Signed)
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  Patient also complained of back pain.  MRI of the lumbar spine suggested epidural abscess.  Patient was transferred to [facility] and underwent lumbar laminectomy for drainage of the epidural abscess.  PTA, pt independent of ADLs.                Action/Plan: Pt will need IV antibiotic therapy until September 18, per ID.   Pt agreeable to SNF placement, if needed, though placement unlikely due to current IVDU and Methadone use.  Should pt be able to wean from Methadone, we may be able to place pt in SNF, though it will be difficult.    Expected Discharge Date:  (unknown)               Expected Discharge Plan:     In-House Referral:  Clinical Social Work  Discharge planning Services  CM Consult  Post Acute Care Choice:    Choice offered to:     DME Arranged:    DME Agency:     HH Arranged:    HH Agency:     Status of Service:  In process, will continue to follow  If discussed at Long Length of Stay Meetings, dates discussed:    Additional Comments:  12/17/17 J. Hodari Chuba, RN, BSN Pt unable to be weaned from Methadone, and therefore will not be able to be placed in SNF to receive IV antibiotics/rehab.  Pt will need IV antibiotic therapy until September 18, per ID.  Pt will remain in house until end of therapy, unless CIR willing to consider admission closer to antibiotic completion date.     Reinaldo Raddle, RN, BSN  Trauma/Neuro ICU Case Manager [phone]

## 2017-12-17 NOTE — Progress Notes (Signed)
Spoke with patient and Mom at length about the patients care and prognosis. They are concerned about the patient having B/B incontinence, they feel like it has not been addressed nor explained to them about the issue. Neurosurgery has signed off on 7/29, however they asked is the consult can be put back in, they would like some clarification. Also they both mentioned the patient has a history of bipolar depression and has not been treated for it in years, she was self-medicating herself. They request that she be re-evaluated, and medicated. The patient is moving up to 6N today. I text paged Dr. Cathlean Sauer with these concerns.

## 2017-12-17 NOTE — Progress Notes (Signed)
PROGRESS NOTE    Kelly [name]  VPX:[id number] DOB: [birth year] DOA: 12/02/2017 PCP: Patient, No Pcp Per    Brief Narrative:  32 year old female who presented with muscle pains. She does havehistory of IV drug abuse and scoliosis. Ongoing symptoms for about 10 days, physical examination blood pressure 106/67, heart rate 115, temperature 98.2, respiratory rate 24. Moist mucous membranes, lungs clear to auscultation bilaterally, heart S1-S2 present and rhythmic, abdomen soft nontender, no lower extremity edema. Sodium 130, potassium 3.6, chloride 90, bicarb 27, glucose 113, BUN 23, creatinine 1.0, AST 13, ALT 6, white count 28.3, hemoglobin 9.5, hematocrit 20.7, platelets 402.Chest x-ray with right-baseatelectasis. Hardware in thoracic spine. Urine analysis specific gravity 1.014, 30 protein, white cells 21-50, RBCs greater than 50. CT chest with multiple focal consolidation both lungs including multiple cavitary lesions suggesting septic emboli.  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 Tricuspid valve endocarditis due to MSSA complicated with septic pulmonary embolism, lumbar epidural abscess and sepsis (present on admission).sp lumbar laminectomy and abscess drainage.Continue withIVcefazolin. Analgesic regimen with scheduled acetaminophen, lidocaine patch, flexeril and as needed IV hydromorphone (reduced dose) plusketorolac, plus as neededoxycodone q 4 hours. Out of bed as tolerated. Continue GI prophylaxis with pantoprazole.  2,  AKI(resolved) with hyponatremia. Has recovered well, will follow bmp every 48 hours for now. Avoid nephrotoxic medications and hypotension.   3. Polysubstance abuse.continue methadone per home regimen. Taper narcotics as tolerated.   4. Scoliosis.Out of bed as tolerated, continue pain control with analgesics.    5. Vaginal candidiasis with urinary retention. Continue topocalclotrimazoleand nystatin. Foley catheter for urinary retention.  DVT prophylaxis:enoxaparin Code Status:full Family Communication:no family at the bedside Disposition Plan/ discharge barriers:pending completion of IV antibiotic therapy, September 18.   Consultants:  ID  Procedures:    Antimicrobials: Cefazolin IV    Subjective: Patient has been complaining of back pain, persistent, moderate in intensity, improved with analgesics. No nausea or vomiting.   Objective: Vitals:   12/16/17 2300 12/17/17 0300 12/17/17 0829 12/17/17 1210  BP: 105/77 110/61    Pulse: 85 98    Resp:      Temp: 98 F (36.7 C) 98.6 F (37 C) 98.6 F (37 C) 98 F (36.7 C)  TempSrc: Oral Oral    SpO2: 96% 95%    Weight:      Height:        Intake/Output Summary (Last 24 hours) at 12/17/2017 1754 Last data filed at 12/17/2017 1457 Gross per 24 hour  Intake 2601.31 ml  Output 3150 ml  Net -548.69 ml   Filed Weights   12/14/17 0500 12/15/17 0800 12/16/17 2203  Weight: 84.5 kg (186 lb 4.6 oz) 84.9 kg (187 lb 2.7 oz) 84.8 kg (186 lb 15.2 oz)    Examination:   General: Not in pain or dyspnea, deconditioned  Neurology: Awake and alert, non focal  E ENT: no pallor, no icterus, oral mucosa moist Cardiovascular: No JVD. S1-S2 present, rhythmic, no gallops, or rubs, right parasternal systolic murmurs. No lower extremity edema. Pulmonary: vesicular breath sounds  bilaterally, adequate air movement, no wheezing, rhonchi or rales. Gastrointestinal. Abdomen with, no organomegaly, non tender, no rebound or  guarding Skin. No rashes Musculoskeletal: no joint deformities     Data Reviewed: I have personally reviewed following labs and imaging studies  CBC: Recent Labs  Lab 12/11/17 0445 12/13/17 0500 12/14/17 0320 12/16/17 0450  WBC 17.5* 15.0* 11.9* 10.0  NEUTROABS  --  10.4* 7.9* 6.8  HGB 8.0* 7.9* 7.7* 7.9*  HCT 25.7* 26.4* 25.5* 25.1*  MCV 90.2 90.7 92.1 88.7  PLT 453* 543* 552* 071*   Basic Metabolic Panel: Recent Labs  Lab 12/11/17 0445 12/13/17 0500 12/14/17 0320 12/16/17 0450  NA 129* 131* 132* 133*  K 4.6 5.0 4.4 5.1  CL 92* 90* 91* 92*  CO2 29 31 29 31   GLUCOSE 94 94 137* 101*  BUN 5* 10 12 10   CREATININE 0.54 0.56 0.56 0.61  CALCIUM 8.4* 9.3 9.2 9.5  MG 1.6*  --   --   --    GFR: Estimated Creatinine Clearance: 112 mL/min (by C-G formula based on SCr of 0.61 mg/dL). Liver Function Tests: No results for input(s): AST, ALT, ALKPHOS, BILITOT, PROT, ALBUMIN in the last 168 hours. No results for input(s): LIPASE, AMYLASE in the last 168 hours. No results for input(s): AMMONIA in the last 168 hours. Coagulation Profile: No results for input(s): INR, PROTIME in the last 168 hours. Cardiac Enzymes: No results for input(s): CKTOTAL, CKMB, CKMBINDEX, TROPONINI in the last 168 hours. BNP (last 3 results) No results for input(s): PROBNP in the last 8760 hours. HbA1C: No results for input(s): HGBA1C in the last 72 hours. CBG: No results for input(s): GLUCAP in the last 168 hours. Lipid Profile: No results for input(s): CHOL, HDL, LDLCALC, TRIG, CHOLHDL, LDLDIRECT in the last 72 hours. Thyroid Function Tests: No results for input(s): TSH, T4TOTAL, FREET4, T3FREE, THYROIDAB in the last 72 hours. Anemia Panel: No results for input(s): VITAMINB12, FOLATE, FERRITIN, TIBC, IRON, RETICCTPCT in the last 72 hours.    Radiology Studies: I have reviewed all of the imaging during this hospital visit personally     Scheduled Meds: . acetaminophen  650 mg Oral Q6H    . bupivacaine liposome  20 mL Infiltration Once  . bupivacaine-EPINEPHrine  50 mL Infiltration Once  . Chlorhexidine Gluconate Cloth  6 each Topical Q2200  . clotrimazole  1 Applicatorful Vaginal QHS  . cyclobenzaprine  10 mg Oral TID  . docusate sodium  100 mg Oral BID  . guaiFENesin  1,200 mg Oral BID  . heparin  5,000 Units Subcutaneous Q8H  . lactobacillus  1 g Oral TID WC  . lidocaine  1 patch Transdermal Q24H  . magnesium oxide  400 mg Oral BID  . methadone  60 mg Oral Daily  . nicotine  21 mg Transdermal Daily  . nystatin cream   Topical BID  . pantoprazole  40 mg Oral Daily  . sodium chloride flush  10-40 mL Intracatheter Q12H   Continuous Infusions: .  ceFAZolin (ANCEF) IV 2 g (12/17/17 1514)  . lactated ringers 10 mL/hr at 12/15/17 2148     LOS: 15 days        Reeves Musick Gerome Apley, MD [facility] Pager [phone]

## 2017-12-18 DIAGNOSIS — R338 Other retention of urine: Secondary | ICD-10-CM

## 2017-12-18 DIAGNOSIS — E875 Hyperkalemia: Secondary | ICD-10-CM

## 2017-12-18 LAB — BASIC METABOLIC PANEL
Anion gap: 11 (ref 5–15)
Anion gap: 11 (ref 5–15)
BUN: 18 mg/dL (ref 6–20)
BUN: 20 mg/dL (ref 6–20)
CALCIUM: 9.6 mg/dL (ref 8.9–10.3)
CALCIUM: 9.8 mg/dL (ref 8.9–10.3)
CHLORIDE: 91 mmol/L — AB (ref 98–111)
CO2: 29 mmol/L (ref 22–32)
CO2: 29 mmol/L (ref 22–32)
CREATININE: 0.64 mg/dL (ref 0.44–1.00)
CREATININE: 0.77 mg/dL (ref 0.44–1.00)
Chloride: 92 mmol/L — ABNORMAL LOW (ref 98–111)
GFR calc Af Amer: >60 mL/min (ref >60)
Glucose, Bld: 105 mg/dL — ABNORMAL HIGH (ref 70–99)
Glucose, Bld: 112 mg/dL — ABNORMAL HIGH (ref 70–99)
Potassium: 5.5 mmol/L — ABNORMAL HIGH (ref 3.5–5.1)
Potassium: 5 mmol/L (ref 3.5–5.1)
SODIUM: 131 mmol/L — AB (ref 135–145)
SODIUM: 132 mmol/L — AB (ref 135–145)

## 2017-12-18 LAB — CBC WITH DIFFERENTIAL/PLATELET
Abs Immature Granulocytes: 0.1 10*3/uL (ref 0.0–0.1)
BASOS ABS: 0.1 10*3/uL (ref 0.0–0.1)
BASOS PCT: 1 %
EOS PCT: 1 %
Eosinophils Absolute: 0.1 10*3/uL (ref 0.0–0.7)
HCT: 25.9 % — ABNORMAL LOW (ref 36.0–46.0)
HEMOGLOBIN: 8 g/dL — AB (ref 12.0–15.0)
Immature Granulocytes: 1 %
LYMPHS PCT: 20 %
Lymphs Abs: 1.9 10*3/uL (ref 0.7–4.0)
MCH: 27.2 pg (ref 26.0–34.0)
MCHC: 30.9 g/dL (ref 30.0–36.0)
MCV: 88.1 fL (ref 78.0–100.0)
Monocytes Absolute: 0.6 10*3/uL (ref 0.1–1.0)
Monocytes Relative: 7 %
Neutro Abs: 6.5 10*3/uL (ref 1.7–7.7)
Neutrophils Relative %: 70 %
PLATELETS: 689 10*3/uL — AB (ref 150–400)
RBC: 2.94 MIL/uL — AB (ref 3.87–5.11)
RDW: 14.4 % (ref 11.5–15.5)
WBC: 9.2 10*3/uL (ref 4.0–10.5)

## 2017-12-18 MED ORDER — SODIUM POLYSTYRENE SULFONATE 15 GM/60ML PO SUSP
15.0000 g | Freq: Once | ORAL | Status: AC
  Administered 2017-12-18: 15 g via ORAL
  Filled 2017-12-18: qty 60

## 2017-12-18 NOTE — Progress Notes (Signed)
Physical Therapy Treatment Patient Details Name: Kelly [name] MRN: [id number] DOB: 1985/10/15 Today's Date: 12/18/2017    History of Present Illness 32 y.o. female admitted with sepsis, tachycardia, tachypnea, leukocytosis, hypotension.  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 PMH of polysubstance abuse, ADD, scoliosis sp back surgery (20 years ago per mom).   PICC placed 12/08/17.    PT Comments    Pt progressing steadily.  Now with pain managed and with gentle encouragement, she is starting to see the progress.   Follow Up Recommendations  CIR     Equipment Recommendations  Rolling walker with 5" wheels;3in1 (PT)    Recommendations for Other Services       Precautions / Restrictions Precautions Precautions: Back;Fall Precaution Comments: Reviewed log roll technique    Mobility  Bed Mobility Overal bed mobility: Needs Assistance Bed Mobility: Rolling;Sidelying to Sit Rolling: Min assist Sidelying to sit: Min assist;Mod assist       General bed mobility comments: cues for sequencing and min/mod truncal assist.  minimal help L LE  Transfers Overall transfer level: Needs assistance Equipment used: Rolling walker (2 wheeled) Transfers: Sit to/from Stand Sit to Stand: Min assist         General transfer comment: cues for hand placement  Ambulation/Gait Ambulation/Gait assistance: Min assist;+2 safety/equipment(as pt started to fatigue, chair kept closer) Gait Distance (Feet): 40 Feet(x2 with rest in between) Assistive device: Rolling walker (2 wheeled) Gait Pattern/deviations: Step-to pattern;Step-through pattern Gait velocity: slower Gait velocity interpretation: <1.8 ft/sec, indicate of risk for recurrent falls General Gait Details: bil knee sag left> right, but no buckling.  Step to progressing toward step through.  improving adducted gait on L after cuing.   Stairs              Wheelchair Mobility    Modified Rankin (Stroke Patients Only)       Balance     Sitting balance-Leahy Scale: Fair Sitting balance - Comments: min guard EOB     Standing balance-Leahy Scale: Poor Standing balance comment: bil UE support on RW and support from therapist while up                            Cognition Arousal/Alertness: Awake/alert Behavior During Therapy: Kelly [name] for tasks assessed/performed;Anxious Overall Cognitive Status: Within Functional Limits for tasks assessed                                 General Comments: Still anxious that she may be incontinent, but improving      Exercises      General Comments General comments (skin integrity, edema, etc.): reinforced back care/Precaution education with expected activity progression from this point on.      Pertinent Vitals/Pain Pain Assessment: Faces Pain Score: 4  Pain Location: back  Pain Descriptors / Indicators: Grimacing;Sore Pain Intervention(s): Monitored during session    Home Living                      Prior Function            PT Goals (current goals can now be found in the care plan section) Acute Rehab PT Goals Patient Stated Goal: "get stronger and less pain" PT Goal Formulation: With patient Time For Goal Achievement: 12/20/17 Potential to Achieve Goals: Good  Progress towards PT goals: Progressing toward goals    Frequency    Min 3X/week      PT Plan Current plan remains appropriate    Co-evaluation              AM-PAC PT "6 Clicks" Daily Activity  Outcome Measure  Difficulty turning over in bed (including adjusting bedclothes, sheets and blankets)?: Unable Difficulty moving from lying on back to sitting on the side of the bed? : Unable Difficulty sitting down on and standing up from a chair with arms (e.g., wheelchair, bedside commode, etc,.)?: Unable Help needed moving to and from a bed to chair (including a  wheelchair)?: A Little Help needed walking in [name] room?: A Little Help needed climbing 3-5 steps with a railing? : A Little 6 Click Score: 12    End of Session     Patient left: in chair;with call bell/phone within reach;with family/visitor present Nurse Communication: Mobility status;Precautions PT Visit Diagnosis: Muscle weakness (generalized) (M62.81);Difficulty in walking, not elsewhere classified (R26.2);Pain;Other symptoms and signs involving the nervous system (R29.898) Pain - Right/Left: (back)     Time: 3646-8032 PT Time Calculation (min) (ACUTE ONLY): 29 min  Charges:  $Gait Training: 8-22 mins $Therapeutic Activity: 8-22 mins                     12/18/2017  Kelly [name], PT [phone]  (pager)   Kelly [name] Kelly [name] 12/18/2017, 1:34 PM

## 2017-12-18 NOTE — Progress Notes (Signed)
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  Patient also complained of back pain.  MRI of the lumbar spine suggested epidural abscess.  Patient was transferred to [facility] and underwent lumbar laminectomy for drainage of the epidural abscess.  Consultants: Infectious disease.  Neurosurgery.  Cardiology  Procedures:   L3, L4, L5 and S1 laminectomy for drainage of lumbar epidural abscess on 7/25  Transthoracic echocardiogram Study Conclusions  - Left ventricle: The cavity size was normal. Systolic function was normal. The estimated ejection fraction was in the range of 55% to 60%. Wall motion was normal; there were no regional wall motion abnormalities. Left ventricular diastolic function parameters were normal. - Aortic valve: Transvalvular velocity was within the normal range. There was no stenosis. There was no regurgitation. - Mitral valve: Thickening of the anterior leaflet. Transvalvular velocity was within the normal range. There was no evidence for stenosis. There was trivial regurgitation. - Left atrium: The atrium was mildly dilated. - Right ventricle: The cavity size was normal. Wall thickness was normal. Systolic function was normal. - Right atrium: The atrium was severely dilated. - Tricuspid valve: There was a medium-sized, 1.0 cm (W) x 1.7 cm (L) vegetation on the septal leaflet. There was severe regurgitation. - Pulmonary arteries: Systolic pressure was mildly increased. PA peak pressure: 42 mm Hg  (S).  Impressions:  - Vegetation noted on the septal leaflet of the tricuspid valve consistent with endocarditis. Cannot exlude vegetation on the anterior leaflet of the mitral valve. Suggest TEE to better assess.  Antibiotics: Cefazolin  Subjective/Interval History: Patient states that she is feeling slightly better in terms of her pain in the back.  But very concerned about stool incontinence as well as the difficulty she has had urinating.  She still has a Foley catheter.  Shortness of breath and chest discomfort has improved.  ROS: Denies any nausea or vomiting  Objective:  Vital Signs  Vitals:   12/17/17 2300 12/18/17 0400 12/18/17 0830 12/18/17 1149  BP: (!) 142/93 104/67 111/82 (!) 92/51  Pulse:   90 81  Resp:      Temp: 97.8 F (36.6 C) 98.7 F (37.1 C) (!) 97.5 F (36.4 C) 97.6 F (36.4 C)  TempSrc: Oral Oral Oral Oral  SpO2:   98% 100%  Weight:      Height:        Intake/Output Summary (Last 24 hours) at 12/18/2017 1437 Last data filed at 12/18/2017 8119 Gross per 24 hour  Intake 310 ml  Output 2450 ml  Net -2140 ml   Filed Weights   12/15/17 0800 12/16/17 2203 12/17/17 2153  Weight: 84.9 kg (187 lb 2.7 oz) 84.8 kg (186 lb 15.2 oz) 84.7 kg (186 lb 11.7 oz)    General appearance: alert, cooperative, appears stated age and no distress Head: Normocephalic, without obvious abnormality, atraumatic Resp: clear to auscultation bilaterally Cardio: regular rate and rhythm, S1, S2 normal, no murmur, click, rub or gallop GI: soft, non-tender; bowel sounds normal; no masses,  no organomegaly Extremities: extremities normal, atraumatic, no cyanosis or edema Neurologic: Able to move both her lower extremities  Lab Results:  Data  Reviewed: I have personally reviewed following labs and imaging studies  CBC: Recent Labs  Lab 12/13/17 0500 12/14/17 0320 12/16/17 0450 12/18/17 0529  WBC 15.0* 11.9* 10.0 9.2  NEUTROABS 10.4* 7.9* 6.8 6.5  HGB 7.9* 7.7*  7.9* 8.0*  HCT 26.4* 25.5* 25.1* 25.9*  MCV 90.7 92.1 88.7 88.1  PLT 543* 552* 616* 689*    Basic Metabolic Panel: Recent Labs  Lab 12/13/17 0500 12/14/17 0320 12/16/17 0450 12/18/17 0529 12/18/17 1401  NA 131* 132* 133* 131* 132*  K 5.0 4.4 5.1 5.5* 5.0  CL 90* 91* 92* 91* 92*  CO2 31 29 31 29 29   GLUCOSE 94 137* 101* 105* 112*  BUN 10 12 10 18 20   CREATININE 0.56 0.56 0.61 0.64 0.77  CALCIUM 9.3 9.2 9.5 9.6 9.8    GFR: Estimated Creatinine Clearance: 111.8 mL/min (by C-G formula based on SCr of 0.77 mg/dL).  Radiology Studies: No results found.   Medications:  Scheduled: . acetaminophen  650 mg Oral Q6H  . bupivacaine liposome  20 mL Infiltration Once  . bupivacaine-EPINEPHrine  50 mL Infiltration Once  . Chlorhexidine Gluconate Cloth  6 each Topical Q2200  . clotrimazole  1 Applicatorful Vaginal QHS  . cyclobenzaprine  10 mg Oral TID  . docusate sodium  100 mg Oral BID  . guaiFENesin  1,200 mg Oral BID  . heparin  5,000 Units Subcutaneous Q8H  . lactobacillus  1 g Oral TID WC  . lidocaine  1 patch Transdermal Q24H  . magnesium oxide  400 mg Oral BID  . methadone  60 mg Oral Daily  . nicotine  21 mg Transdermal Daily  . nystatin cream   Topical BID  . pantoprazole  40 mg Oral Daily  . sodium chloride flush  10-40 mL Intracatheter Q12H   Continuous: .  ceFAZolin (ANCEF) IV 2 g (12/18/17 1338)  . 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  Infectious disease was consulted.  Patient remains on Cefazolin. RPR nonreactive.  HIV nonreactive.  Hepatitis C RNA by PCR negative.  Hepatitis A and B neg. Echocardiogram  suggests severe tricuspid regurgitation.  Cardiology was consulted.  They recommend completing treatment with IV antibiotics and repeat echocardiogram afterwards to determine if she will need further intervention.  No plans for TEE currently.  Per ID she will need 8 weeks of IV cefazolin through September 18.  And may need oral treatment subsequently.  Lumbar epidural abscess She is status post lumbar laminectomy and drainage of abscess on 7/25.  Patient developed urinary retention and required Foley catheterization.  She is experiencing some stool incontinence as well.  Does not appear to have any new neurological deficits.  Patient and her mother very concerned about these.  Discussed with neurosurgery who will reevaluate patient today.  History of scoliosis status post previous surgery/chronic back pain Patient mentioned that she takes methadone prescribed by an outpatient clinic.  [facility] prescriber database was queried and did not show any prescriptions for methadone. [facility] in [place] was called.  Apparently patient was being given 80 mg of methadone at the clinic (no prescription) starting in April and last dose was on October 25, 2017.  But patient has not returned to the clinic since  that and so she has been discharged.  And since no prescription was written, this information apparently does not show up on the [facility] prescribers database.  Patient was started on methadone here in the hospital mainly for pain control now.  Currently on 60 mg.  Her pain seems to be better controlled.    Mild acute renal failure with hyponatremia Resolved.  Hyperkalemia Reason for elevated potassium level not entirely clear.  She was given Kayexalate with improvement.  Recheck labs tomorrow.  Polysubstance abuse Will need ongoing counseling and outpatient rehab.  Vaginal candidiasis Continue topical agents.  Urinary retention Currently has Foley catheter.  Wait for neurosurgical input.  Her  mobility has improved.  Could consider a voiding trial in the next few days.  Normocytic anemia No evidence for overt bleeding.  Continue to watch.  Anasarca Seems to have improved.  Her weight has decreased significantly.  DVT Prophylaxis: Subcutaneous heparin    Code Status: Full code Family Communication: Discussed with the patient Disposition Plan: Management as outlined above.  Will likely remain in the hospital for duration of treatment.    LOS: 16 days   [facility] Pager [phone] 12/18/2017, 2:37 PM  If 7PM-7AM, please contact night-coverage at [email or web address], password [facility]

## 2017-12-19 DIAGNOSIS — E871 Hypo-osmolality and hyponatremia: Secondary | ICD-10-CM

## 2017-12-19 LAB — COMPREHENSIVE METABOLIC PANEL
ALBUMIN: 2.4 g/dL — AB (ref 3.5–5.0)
ALK PHOS: 224 U/L — AB (ref 38–126)
ALT: 5 U/L (ref 0–44)
ANION GAP: 11 (ref 5–15)
AST: 16 U/L (ref 15–41)
BUN: 26 mg/dL — ABNORMAL HIGH (ref 6–20)
CHLORIDE: 92 mmol/L — AB (ref 98–111)
CO2: 29 mmol/L (ref 22–32)
Calcium: 9.8 mg/dL (ref 8.9–10.3)
Creatinine, Ser: 0.8 mg/dL (ref 0.44–1.00)
GFR calc non Af Amer: >60 mL/min (ref >60)
GLUCOSE: 89 mg/dL (ref 70–99)
Potassium: 5.5 mmol/L — ABNORMAL HIGH (ref 3.5–5.1)
Sodium: 132 mmol/L — ABNORMAL LOW (ref 135–145)
Total Bilirubin: 0.6 mg/dL (ref 0.3–1.2)
Total Protein: 8.4 g/dL — ABNORMAL HIGH (ref 6.5–8.1)

## 2017-12-19 MED ORDER — SODIUM POLYSTYRENE SULFONATE 15 GM/60ML PO SUSP
15.0000 g | Freq: Once | ORAL | Status: AC
  Administered 2017-12-19: 15 g via ORAL
  Filled 2017-12-19: qty 60

## 2017-12-19 MED ORDER — HYDROMORPHONE HCL 1 MG/ML IJ SOLN: 0.5000 mg | INTRAMUSCULAR | Status: DC | PRN

## 2017-12-19 MED ORDER — OXYCODONE HCL 5 MG PO TABS
5.0000 mg | ORAL_TABLET | ORAL | Status: DC | PRN
Start: 2017-12-19 — End: 2018-01-30
  Administered 2017-12-19 – 2018-01-30 (×151): 10 mg via ORAL
  Filled 2017-12-19 (×157): qty 2

## 2017-12-19 MED ORDER — SENNOSIDES-DOCUSATE SODIUM 8.6-50 MG PO TABS
1.0000 | ORAL_TABLET | Freq: Two times a day (BID) | ORAL | Status: DC
  Administered 2017-12-19 – 2018-01-30 (×83): 1 via ORAL
  Filled 2017-12-19 (×83): qty 1

## 2017-12-19 MED ORDER — KETOROLAC TROMETHAMINE 10 MG PO TABS
10.0000 mg | ORAL_TABLET | Freq: Four times a day (QID) | ORAL | Status: AC | PRN
  Administered 2017-12-20 – 2017-12-24 (×7): 10 mg via ORAL
  Filled 2017-12-19 (×12): qty 1

## 2017-12-19 MED ORDER — HYDROMORPHONE HCL 1 MG/ML IJ SOLN
0.5000 mg | INTRAMUSCULAR | Status: DC | PRN
  Administered 2017-12-19 – 2018-01-30 (×168): 0.5 mg via INTRAVENOUS
  Filled 2017-12-19 (×168): qty 1

## 2017-12-19 NOTE — Plan of Care (Signed)
  Problem: Health Behavior/Discharge Planning: Goal: Ability to manage health-related needs will improve Outcome: Progressing   

## 2017-12-19 NOTE — Progress Notes (Signed)
Subjective: The patient is alert and pleasant.  She looks and feels much better since the last time I saw her.  Her back pain is much better.  She is been ambulating in the hallways with assistance and with a walker.  She still has urinary retention and occasionally fecal incontinence.  Objective: Vital signs in last 24 hours: Temp:  [97.5 F (36.4 C)-98.4 F (36.9 C)] 97.9 F (36.6 C) (08/08 0806) Pulse Rate:  [79-96] 86 (08/08 0806) BP: (92-117)/(51-80) 111/74 (08/08 0806) SpO2:  [94 %-100 %] 97 % (08/08 0806) Weight:  [82.8 kg] 82.8 kg (08/08 0300) Estimated body mass index is 28.59 kg/m as calculated from the following:   Height as of this encounter: 5' 7"  (1.702 m).   Weight as of this encounter: 82.8 kg.   Intake/Output from previous day: 08/07 0701 - 08/08 0700 In: 400 [IV Piggyback:400] Out: 800 [Urine:800] Intake/Output this shift: No intake/output data recorded.  Physical exam patient is alert and pleasant.  Her strength is nearly normal in her right lower extremity.  She has some residual weakness in the left lower extremity at 4/5.  She says she has perineal sensation to her own touch.  The patient's wound is healing well.    Lab Results: Recent Labs    12/18/17 0529  WBC 9.2  HGB 8.0*  HCT 25.9*  PLT 689*   BMET Recent Labs    12/18/17 1401 12/19/17 0507  NA 132* 132*  K 5.0 5.5*  CL 92* 92*  CO2 29 29  GLUCOSE 112* 89  BUN 20 26*  CREATININE 0.77 0.80  CALCIUM 9.8 9.8    Studies/Results: No results found.  Assessment/Plan: Postop day #17: The patient is making good progress.  He is much stronger and having much less pain since surgery.  I have told her that I feel that eventually her bowel and bladder issues will resolve, but there is nothing we can do about it presently other than give it time.  I have answered all her questions.  LOS: 17 days     Ophelia Charter 12/19/2017, 10:28 AM

## 2017-12-19 NOTE — Progress Notes (Signed)
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  Patient also complained of back pain.  MRI of the lumbar spine suggested epidural abscess.  Patient was transferred to [facility] and underwent lumbar laminectomy for drainage of the epidural abscess.  Consultants: Infectious disease.  Neurosurgery.  Cardiology  Procedures:   L3, L4, L5 and S1 laminectomy for drainage of lumbar epidural abscess on 7/25  Transthoracic echocardiogram Study Conclusions  - Left ventricle: The cavity size was normal. Systolic function was normal. The estimated ejection fraction was in the range of 55% to 60%. Wall motion was normal; there were no regional wall motion abnormalities. Left ventricular diastolic function parameters were normal. - Aortic valve: Transvalvular velocity was within the normal range. There was no stenosis. There was no regurgitation. - Mitral valve: Thickening of the anterior leaflet. Transvalvular velocity was within the normal range. There was no evidence for stenosis. There was trivial regurgitation. - Left atrium: The atrium was mildly dilated. - Right ventricle: The cavity size was normal. Wall thickness was normal. Systolic function was normal. - Right atrium: The atrium was severely dilated. - Tricuspid valve: There was a medium-sized, 1.0 cm (W) x 1.7 cm (L) vegetation on the septal leaflet. There was severe regurgitation. - Pulmonary arteries: Systolic pressure was mildly increased. PA peak pressure: 42 mm Hg  (S).  Impressions:  - Vegetation noted on the septal leaflet of the tricuspid valve consistent with endocarditis. Cannot exlude vegetation on the anterior leaflet of the mitral valve. Suggest TEE to better assess.  Antibiotics: Cefazolin  Subjective/Interval History: Patient feels about the same.  No new complaints offered.  Able to move her legs.  Still concerned about stool and urinary difficulties.     Objective:  Vital Signs  Vitals:   12/19/17 0611 12/19/17 0632 12/19/17 0806 12/19/17 1154  BP:   111/74 106/82  Pulse: 93 87 86 94  Resp:      Temp:   97.9 F (36.6 C) 98.5 F (36.9 C)  TempSrc:   Oral Oral  SpO2: 95% 94% 97% 95%  Weight:      Height:        Intake/Output Summary (Last 24 hours) at 12/19/2017 1220 Last data filed at 12/19/2017 0600 Gross per 24 hour  Intake 400 ml  Output 800 ml  Net -400 ml   Filed Weights   12/16/17 2203 12/17/17 2153 12/19/17 0300  Weight: 84.8 kg 84.7 kg 82.8 kg    General appearance: Awake alert.  In no distress Resp: Clear to auscultation bilaterally Cardio: S1-S2 is normal regular.  No S3-S4.  No rubs murmurs or bruit GI: Soft.  Nontender nondistended.  Bowel sounds are present. Neurologic: Able to move both her lower extremities  Lab Results:  Data Reviewed: I have personally reviewed following labs and imaging studies  CBC: Recent Labs  Lab 12/13/17 0500 12/14/17 0320 12/16/17 0450 12/18/17 0529  WBC 15.0* 11.9* 10.0 9.2  NEUTROABS 10.4* 7.9* 6.8 6.5  HGB 7.9* 7.7* 7.9* 8.0*  HCT 26.4* 25.5* 25.1* 25.9*  MCV 90.7 92.1 88.7 88.1  PLT 543* 552* 616* 689*    Basic Metabolic Panel:  Recent Labs  Lab 12/14/17 0320 12/16/17 0450 12/18/17 0529 12/18/17 1401 12/19/17 0507  NA 132* 133* 131* 132* 132*  K 4.4 5.1 5.5* 5.0 5.5*  CL 91* 92* 91* 92* 92*  CO2 29 31 29 29 29   GLUCOSE 137* 101* 105* 112* 89  BUN 12 10 18 20  26*  CREATININE 0.56 0.61 0.64 0.77 0.80  CALCIUM 9.2 9.5 9.6 9.8 9.8     GFR: Estimated Creatinine Clearance: 110.7 mL/min (by C-G formula based on SCr of 0.8 mg/dL).  Radiology Studies: No results found.   Medications:  Scheduled: . acetaminophen  650 mg Oral Q6H  . bupivacaine liposome  20 mL Infiltration Once  . bupivacaine-EPINEPHrine  50 mL Infiltration Once  . Chlorhexidine Gluconate Cloth  6 each Topical Q2200  . clotrimazole  1 Applicatorful Vaginal QHS  . cyclobenzaprine  10 mg Oral TID  . docusate sodium  100 mg Oral BID  . guaiFENesin  1,200 mg Oral BID  . heparin  5,000 Units Subcutaneous Q8H  . lactobacillus  1 g Oral TID WC  . lidocaine  1 patch Transdermal Q24H  . magnesium oxide  400 mg Oral BID  . methadone  60 mg Oral Daily  . nicotine  21 mg Transdermal Daily  . nystatin cream   Topical BID  . pantoprazole  40 mg Oral Daily  . sodium chloride flush  10-40 mL Intracatheter Q12H  . sodium polystyrene  15 g Oral Once   Continuous: .  ceFAZolin (ANCEF) IV 2 g (12/19/17 0505)  . 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  Infectious disease was consulted.  Patient remains on Cefazolin. RPR nonreactive.  HIV nonreactive.  Hepatitis C RNA by PCR negative.  Hepatitis A and B neg. Echocardiogram suggests severe tricuspid regurgitation.  Cardiology was consulted.  They recommend completing treatment with IV antibiotics and repeat echocardiogram afterwards to determine if she will need further intervention.  No plans for TEE currently.  Per ID she will need 8 weeks of IV cefazolin through September 18.  And may need oral treatment subsequently.  Lumbar epidural abscess She is status post lumbar  laminectomy and drainage of abscess on 7/25.  Patient developed urinary retention and required Foley catheterization.  She is experiencing some stool incontinence as well.  Does not appear to have any new neurological deficits.  Patient and her mother very concerned about these.  Patient seen by neurosurgery this morning.  Apparently these issues will take a long time to resolve.    History of scoliosis status post previous surgery/chronic back pain Patient mentioned that she takes methadone prescribed by an outpatient clinic.  The [facility] prescriber database was queried and did not show any prescriptions for methadone. [facility] in [place] was called.  Apparently patient was being given 80 mg of methadone at the clinic (no prescription) starting in April and last dose was on October 25, 2017.  But patient has not returned to the clinic since that and so she has been discharged.  And since no prescription was written, this information apparently does not show up on the [place] prescribers database.  Patient was started on methadone here in the hospital mainly for pain control now.  Currently on 60 mg.  Her pain seems to be better controlled.    Mild acute renal failure with hyponatremia Renal function is normal.  She is noted to have mild hyponatremia which has been stable.  Hyperkalemia Reason for hyperkalemia is not entirely clear.  She is not getting any potassium supplements.  She does not appear to be on any medications which can raise the potassium level.  Could this be pseudohyperkalemia?  We will give her another dose of Kayexalate.  Patient without any clinical evidence for heart failure or chronic kidney disease.  Would recommend checking potassium levels without using tourniquet.  Cortisol level in the morning.  Polysubstance abuse Will need ongoing counseling and outpatient rehab.  Vaginal candidiasis Continue topical agents.  Urinary retention Currently has Foley catheter.  Per  neurosurgery her bowel and bladder issues will eventually resolve but nothing to be done for it right now.  We could consider a voiding trial soon.    Normocytic anemia No evidence for overt bleeding.  Continue to watch.  Anasarca Seems to have improved.  Her weight has decreased significantly.  DVT Prophylaxis: Subcutaneous heparin    Code Status: Full code Family Communication: Discussed with the patient Disposition Plan: Management as outlined above.  Will likely remain in the hospital for duration of treatment.    LOS: 17 days   [facility] Pager [phone] 12/19/2017, 12:20 PM  If 7PM-7AM, please contact night-coverage at [email or web address], password [facility]

## 2017-12-19 NOTE — Progress Notes (Signed)
Occupational Therapy Treatment Patient Details Name: Kelly [name] MRN: [id number] DOB: 1985-10-14 Today's Date: 12/19/2017    History of present illness 32 y.o. female admitted with sepsis, tachycardia, tachypnea, leukocytosis, hypotension.  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 PMH of polysubstance abuse, ADD, scoliosis sp back surgery (20 years ago per mom).   PICC placed 12/08/17.   OT comments  This 32 yo female admitted and underwent above seen today and making progress with toilet transfers and bed mobility. Pt concerned that she cannot tell more sure when she needs to go to bathroom and when she tries to go cannot tell if she is going/has gone. She will continue to benefit from acute OT with follow up on CIR.   Follow Up Recommendations  CIR;Supervision/Assistance - 24 hour    Equipment Recommendations  3 in 1 bedside commode;Wheelchair (measurements OT);Hospital bed;Wheelchair cushion (measurements OT)       Precautions / Restrictions Precautions Precautions: Back;Fall Precaution Comments: Reviewed log roll technique Restrictions Weight Bearing Restrictions: No       Mobility Bed Mobility Overal bed mobility: Needs Assistance Bed Mobility: Rolling;Sidelying to Sit Rolling: Min guard Sidelying to sit: Min guard;HOB elevated          Transfers Overall transfer level: Needs assistance Equipment used: Rolling walker (2 wheeled) Transfers: Sit to/from Stand Sit to Stand: Min assist         General transfer comment: cues for hand placement    Balance Overall balance assessment: Needs assistance Sitting-balance support: No upper extremity supported;Feet supported Sitting balance-Leahy Scale: Fair     Standing balance support: Bilateral upper extremity supported Standing balance-Leahy Scale: Poor Standing balance comment: bil UE support on RW and support from therapist  while up                           ADL either performed or assessed with clinical judgement   ADL Overall ADL's : Needs assistance/impaired                         Toilet Transfer: Minimal assistance;Ambulation;Comfort height toilet;Grab bars   Toileting- Clothing Manipulation and Hygiene: Minimal assistance;Sitting/lateral lean;Sit to/from stand         General ADL Comments: Pt reports she can feel pressure with needing to have bowel movement today, but cannot tell when she strains if it is coming out or not. Yesterday she reports she could not feel the pressure of needing to have bowel movement.     Vision Patient Visual Report: No change from baseline            Cognition Arousal/Alertness: Awake/alert Behavior During Therapy: WFL for tasks assessed/performed Overall Cognitive Status: Within Functional Limits for tasks assessed                                 General Comments: Still anxious that she may be incontinent, but improving                   Pertinent Vitals/ Pain       Pain Assessment: 0-10 Pain Score: 6  Pain Location: back  Pain Descriptors / Indicators: Grimacing;Sore Pain Intervention(s): Monitored during session;Repositioned;Patient requesting pain meds-RN notified  Home Living Family/patient expects to be discharged to:: Shelter/Homeless  Additional Comments: Per mom pt is homeless and was ambulatory at least at the beginning of the month without an assistive device.        Prior Functioning/Environment Level of Independence: Independent        Comments: progressive worsening LE weakness and numbness for the past few weeks per mom   Frequency  Min 2X/week        Progress Toward Goals  OT Goals(current goals can now be found in the care plan section)  Progress towards OT goals: Progressing toward goals     Plan Discharge plan needs to be  updated;Frequency remains appropriate       AM-PAC PT "6 Clicks" Daily Activity     Outcome Measure   Help from another person eating meals?: None Help from another person taking care of personal grooming?: A Little Help from another person toileting, which includes using toliet, bedpan, or urinal?: A Lot Help from another person bathing (including washing, rinsing, drying)?: A Lot Help from another person to put on and taking off regular upper body clothing?: A Little Help from another person to put on and taking off regular lower body clothing?: Total 6 Click Score: 15    End of Session Equipment Utilized During Treatment: Rolling walker;Gait belt  OT Visit Diagnosis: Unsteadiness on feet (R26.81);Other abnormalities of gait and mobility (R26.89);Pain Pain - part of body: (back)   Activity Tolerance Patient tolerated treatment well   Patient Left in chair;with call bell/phone within reach;with chair alarm set   Nurse Communication Patient requests pain meds(pt feeling like she needs to go to bathroom more but not sure, RN in to check)        Time: 1244-1319 OT Time Calculation (min): 35 min  Charges: OT General Charges $OT Visit: 1 Visit OT Treatments $Self Care/Home Management : 23-37 mins  [address], OTR/L [phone] 12/19/2017

## 2017-12-20 LAB — BASIC METABOLIC PANEL
ANION GAP: 12 (ref 5–15)
BUN: 23 mg/dL — ABNORMAL HIGH (ref 6–20)
CALCIUM: 9.9 mg/dL (ref 8.9–10.3)
CO2: 29 mmol/L (ref 22–32)
CREATININE: 0.78 mg/dL (ref 0.44–1.00)
Chloride: 93 mmol/L — ABNORMAL LOW (ref 98–111)
GFR calc Af Amer: >60 mL/min (ref >60)
Glucose, Bld: 105 mg/dL — ABNORMAL HIGH (ref 70–99)
Potassium: 5 mmol/L (ref 3.5–5.1)
SODIUM: 134 mmol/L — AB (ref 135–145)

## 2017-12-20 LAB — CORTISOL-AM, BLOOD: CORTISOL - AM: 3.4 ug/dL — AB (ref 6.7–22.6)

## 2017-12-20 MED ORDER — SODIUM POLYSTYRENE SULFONATE 15 GM/60ML PO SUSP
15.0000 g | Freq: Once | ORAL | Status: AC
  Administered 2017-12-20: 15 g via ORAL
  Filled 2017-12-20: qty 60

## 2017-12-20 MED ORDER — COSYNTROPIN 0.25 MG IJ SOLR
0.2500 mg | Freq: Once | INTRAMUSCULAR | Status: AC
  Administered 2017-12-21: 0.25 mg via INTRAVENOUS
  Filled 2017-12-20: qty 0.25

## 2017-12-20 NOTE — Progress Notes (Signed)
Rehab Admissions Coordinator Note:  Per PT and OT recommendation, Patient was screened by Kelly [name] for appropriateness for an Inpatient Acute Rehab Consult. AC has noted that pt is to remain in the hospital until September 18th for IV treatment. Pt will most likely progress past the point of needing CIR by completion of her treatment. At this time, we are recommending holding off on CIR consult; if pt continues to require intensive rehab as it gets closer to completion of her IV antibiotics, please re-consult CIR.   Kelly [name] 12/20/2017, 8:03 AM  I can be reached at [phone].

## 2017-12-20 NOTE — Progress Notes (Signed)
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  Patient also complained of back pain.  MRI of the lumbar spine suggested epidural abscess.  Patient was transferred to [facility] and underwent lumbar laminectomy for drainage of the epidural abscess.  Consultants: Infectious disease.  Neurosurgery.  Cardiology  Procedures:   L3, L4, L5 and S1 laminectomy for drainage of lumbar epidural abscess on 7/25  Transthoracic echocardiogram Study Conclusions  - Left ventricle: The cavity size was normal. Systolic function was normal. The estimated ejection fraction was in the range of 55% to 60%. Wall motion was normal; there were no regional wall motion abnormalities. Left ventricular diastolic function parameters were normal. - Aortic valve: Transvalvular velocity was within the normal range. There was no stenosis. There was no regurgitation. - Mitral valve: Thickening of the anterior leaflet. Transvalvular velocity was within the normal range. There was no evidence for stenosis. There was trivial regurgitation. - Left atrium: The atrium was mildly dilated. - Right ventricle: The cavity size was normal. Wall thickness was normal. Systolic function was normal. - Right atrium: The atrium was severely dilated. - Tricuspid valve: There was a medium-sized, 1.0 cm (W) x 1.7 cm (L) vegetation on the septal leaflet. There was severe regurgitation. - Pulmonary arteries: Systolic pressure was mildly increased. PA peak pressure: 42 mm Hg  (S).  Impressions:  - Vegetation noted on the septal leaflet of the tricuspid valve consistent with endocarditis. Cannot exlude vegetation on the anterior leaflet of the mitral valve. Suggest TEE to better assess.  Antibiotics: Cefazolin  Subjective/Interval History: Patient denies any new complaints.  Getting better on a daily basis.  Agreeable to do a voiding trial today.  Reassured by neurosurgery yesterday.     Objective:  Vital Signs  Vitals:   12/19/17 1620 12/19/17 1700 12/19/17 2109 12/20/17 0637  BP: 96/63 102/66 104/65   Pulse: 91 92 83   Resp:   17   Temp: (!) 97.3 F (36.3 C) 97.7 F (36.5 C) 98.1 F (36.7 C)   TempSrc: Oral Oral Oral   SpO2: 99% 97% 97%   Weight:    82.5 kg  Height:        Intake/Output Summary (Last 24 hours) at 12/20/2017 1054 Last data filed at 12/20/2017 0900 Gross per 24 hour  Intake 971.08 ml  Output 3275 ml  Net -2303.92 ml   Filed Weights   12/17/17 2153 12/19/17 0300 12/20/17 0637  Weight: 84.7 kg 82.8 kg 82.5 kg    General appearance: Awake and alert.  In no distress. Resp: Clear to auscultation bilaterally.  Normal effort Cardio: S1-S2 is normal regular.  No S3-S4.  No rubs murmurs or bruit GI: Soft.  Nontender nondistended.  Bowel sounds are present.  No masses organomegaly Neurologic: Able to move both her lower extremities  Lab Results:  Data Reviewed: I have personally reviewed following labs and imaging studies  CBC: Recent Labs  Lab 12/14/17 0320 12/16/17 0450 12/18/17 0529  WBC 11.9* 10.0 9.2  NEUTROABS 7.9* 6.8 6.5  HGB 7.7* 7.9* 8.0*  HCT 25.5* 25.1* 25.9*  MCV 92.1 88.7 88.1  PLT 552* 616* 689*  Basic Metabolic Panel: Recent Labs  Lab 12/16/17 0450 12/18/17 0529 12/18/17 1401 12/19/17 0507 12/20/17 0344  NA 133* 131* 132* 132* 134*  K 5.1 5.5* 5.0 5.5* 5.0  CL 92* 91* 92* 92* 93*  CO2 31 29 29 29 29   GLUCOSE 101* 105* 112* 89 105*  BUN 10 18 20  26* 23*  CREATININE 0.61 0.64  0.77 0.80 0.78  CALCIUM 9.5 9.6 9.8 9.8 9.9    GFR: Estimated Creatinine Clearance: 110.5 mL/min (by C-G formula based on SCr of 0.78 mg/dL).  Radiology Studies: No results found.   Medications:  Scheduled: . acetaminophen  650 mg Oral Q6H  . bupivacaine liposome  20 mL Infiltration Once  . bupivacaine-EPINEPHrine  50 mL Infiltration Once  . Chlorhexidine Gluconate Cloth  6 each Topical Q2200  . [START ON 12/21/2017] cosyntropin  0.25 mg Intravenous Once  . cyclobenzaprine  10 mg Oral TID  . docusate sodium  100 mg Oral BID  . guaiFENesin  1,200 mg Oral BID  . heparin  5,000 Units Subcutaneous Q8H  . lactobacillus  1 g Oral TID WC  . lidocaine  1 patch Transdermal Q24H  . magnesium oxide  400 mg Oral BID  . methadone  60 mg Oral Daily  . nicotine  21 mg Transdermal Daily  . nystatin cream   Topical BID  . pantoprazole  40 mg Oral Daily  . senna-docusate  1 tablet Oral BID  . sodium chloride flush  10-40 mL Intracatheter Q12H   Continuous: .  ceFAZolin (ANCEF) IV 2 g (12/20/17 0704)  . lactated ringers 10 mL/hr at 12/20/17 0600   QXI:HWTUUEKCM, HYDROmorphone (DILAUDID) injection, ketorolac, lip balm, menthol-cetylpyridinium **OR** phenol, naLOXone (NARCAN)  injection, ondansetron **OR** ondansetron (ZOFRAN) IV, oxyCODONE, sodium chloride flush, zolpidem  Assessment/Plan:    MSSA bacteremia with tricuspid valve endocarditis/septic emboli to lungs Secondary to IV drug use.  Infectious disease was consulted.  Patient remains on Cefazolin. RPR nonreactive.  HIV nonreactive.  Hepatitis C RNA by PCR negative.  Hepatitis A and B neg. Echocardiogram suggests severe tricuspid regurgitation.  Cardiology was consulted.  They recommend completing treatment with IV antibiotics and repeat echocardiogram afterwards to determine if she will need further intervention.  No plans for TEE currently.  Per ID she will need 8 weeks of IV cefazolin through September 18.  And may need oral treatment  subsequently.    Lumbar epidural abscess She is status post lumbar laminectomy and drainage of abscess on 7/25.  Patient developed urinary retention and required Foley catheterization.  She is experiencing some stool incontinence as well.  Does not appear to have any new neurological deficits.  Patient and her mother very concerned about these.  Patient was reevaluated by neurosurgery. Apparently these issues will take a long time to resolve.  Agreeable to voiding trial today.  Patient also encouraged to just try oral medications and come off of IV pain medications.  History of scoliosis status post previous surgery/chronic back pain Patient mentioned that she takes methadone prescribed by an outpatient clinic.  [facility] prescriber database was queried and did not show any prescriptions for methadone. [facility] in [place] was called.  Apparently patient was being given 80 mg of methadone at the clinic (no prescription) starting in April and last dose was on October 25, 2017.  But patient has not returned to the clinic since that and so she was discharged.  And since no prescription was written, this information apparently does not show up on  the [place] prescribers database.  Patient was started on methadone here in the hospital. Currently on 60 mg.  Her pain seems to be better controlled.  She did inform me that the patient's mother has contacted the clinic and they have agreed to take her back once she is discharged from the hospital.  So we will leave her on current dose of methadone.  Patient was told that we will be unable to prescribe this medication at discharge.  Mild acute renal failure with hyponatremia Renal function is normal.  She is noted to have mild hyponatremia which has been stable.  Hyperkalemia Patient's potassium level is better today.  Reason for hyperkalemia is not entirely clear.  She is not noted to be on any medications which can raise the potassium level.  This could  be pseudohyperkalemia.  However patient's sodium also noted to be low.  Cortisol level was checked which is noted to be low this morning.  We will order cosyntropin stimulation test tomorrow morning.    Polysubstance abuse Will need ongoing counseling and outpatient rehab.  Patient to follow-up with the rehab clinic post discharge.  They will take up the responsibility of prescribing methadone.  Vaginal candidiasis Continue topical agents.  Urinary retention Currently has Foley catheter.  Per neurosurgery her bowel and bladder issues will eventually resolve but nothing to be done for it right now.  Patient agreeable to a voiding trial today.  Normocytic anemia No evidence for overt bleeding.  Continue to watch.  Anasarca Seems to have improved.  Her weight has decreased significantly.  DVT Prophylaxis: Subcutaneous heparin    Code Status: Full code Family Communication: Discussed with the patient Disposition Plan: Management as outlined above.  Will likely remain in the hospital for duration of treatment which will be till September 18.    LOS: 18 days   [facility] Pager [phone] 12/20/2017, 10:54 AM  If 7PM-7AM, please contact night-coverage at [email or web address], password [facility]

## 2017-12-20 NOTE — Progress Notes (Signed)
Physical Therapy Treatment Patient Details Name: Kelly [name] MRN: [id number] DOB: 23-Nov-1985 Today's Date: 12/20/2017    History of Present Illness Kelly [name] is a 32 y.o. F with sepsis, tachycardia, tachypnea, leukocytosis, hypotension.  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 PMH of polysubstance abuse, ADD, scoliosis sp back surgery (20 years ago per mom). PICC placed 12/08/17.    PT Comments    Pt is progressing toward goals. Pt performed bed mobility with Supervision-MinA. Pt performed sit<>stand transfers with MinG-MinA, and pt ambulated a further distance with MinG and RW. Pt continues to be limited by pain and fatigue. Will continue to follow acutely to support independence, mobility, and safety.   Follow Up Recommendations  CIR     Equipment Recommendations  Rolling walker with 5" wheels;3in1 (PT)    Recommendations for Other Services       Precautions / Restrictions Precautions Precautions: Back;Fall Precaution Booklet Issued: No Precaution Comments: Reviewed log roll technique and "BLT" precautions Restrictions Weight Bearing Restrictions: No    Mobility  Bed Mobility Overal bed mobility: Needs Assistance Bed Mobility: Rolling;Sidelying to Sit;Sit to Sidelying Rolling: Supervision Sidelying to sit: Supervision     Sit to sidelying: Min assist General bed mobility comments: Pt required cues for log-roll sequencing. Pt had some difficulty with trunk elevation, but used rails and was able to transfer sidelying->sit without physical assist. Pt did require MinA for LE to return sit->sidelying due to weakness in BLE.   Transfers Overall transfer level: Needs assistance Equipment used: Rolling walker (2 wheeled) Transfers: Sit to/from Stand Sit to Stand: Min guard;Min assist         General transfer comment: Pt requires cues for hand placement. Pt was able to perform  sit<>stand transfers 4x times with MinG-MinA. Pt had difficulty with initial power up, and seemed to power up in steps throughout transfer, rather than in one smooth motion. Pt fatigued with progressive transfers and moved from requiring MinG to [place].  Ambulation/Gait Ambulation/Gait assistance: Min guard;+2 safety/equipment(Chair follow) Gait Distance (Feet): 120 [id number]) Assistive device: Rolling walker (2 wheeled) Gait Pattern/deviations: Step-to pattern;Step-through pattern;Decreased stride length;Narrow base of support;Trunk flexed Gait velocity: Decreased   General Gait Details: Pt has a narrow base of support and responds to cues to widen stance during ambulation. Pt continues to have some knee sagging, but no buckling. Pt was able to progress from step-to pattern to step-through pattern without cueing. Pt walked further distance today (12/20/2017) compared to 12/18/2017. Pt reported increased pain in back with ambulation. Pt required seated rest between gait distances secondary to fatigue.    Stairs             Wheelchair Mobility    Modified Rankin (Stroke Patients Only)       Balance Overall balance assessment: Needs assistance Sitting-balance support: No upper extremity supported;Feet supported Sitting balance-Leahy Scale: Fair Sitting balance - Comments: Pt had some difficulty letting go of support while sitting at EOB, but was able to sit without UE support as PT put on gait belt without a visible LOB.   Standing balance support: Bilateral upper extremity supported Standing balance-Leahy Scale: Poor Standing balance comment: Pt reliant on BUE via RW for balance                            Cognition Arousal/Alertness: Awake/alert Behavior During Therapy: WFL for tasks assessed/performed Overall Cognitive Status:  Within Functional Limits for tasks assessed                                 General Comments: Pt reports she's anxious she'll  void or have a bowel movement without knowing.       Exercises      General Comments        Pertinent Vitals/Pain Pain Assessment: Faces Faces Pain Scale: Hurts even more Pain Location: back  Pain Descriptors / Indicators: Grimacing;Guarding Pain Intervention(s): Limited activity within patient's tolerance;Monitored during session;Repositioned;Premedicated before session    Home Living                      Prior Function            PT Goals (current goals can now be found in the care plan section) Acute Rehab PT Goals Patient Stated Goal: To be able to walk independently PT Goal Formulation: With patient Time For Goal Achievement: 01/03/18 Potential to Achieve Goals: Good Progress towards PT goals: Progressing toward goals;Goals met and updated - see care plan    Frequency    Min 3X/week      PT Plan Current plan remains appropriate    Co-evaluation              AM-PAC PT "6 Clicks" Daily Activity  Outcome Measure  Difficulty turning over in bed (including adjusting bedclothes, sheets and blankets)?: A Lot Difficulty moving from lying on back to sitting on the side of the bed? : A Lot Difficulty sitting down on and standing up from a chair with arms (e.g., wheelchair, bedside commode, etc,.)?: Unable Help needed moving to and from a bed to chair (including a wheelchair)?: A Little Help needed walking in hospital room?: A Little Help needed climbing 3-5 steps with a railing? : A Lot 6 Click Score: 13    End of Session Equipment Utilized During Treatment: Gait belt Activity Tolerance: Patient limited by fatigue;Patient limited by pain Patient left: in bed;with call bell/phone within reach Nurse Communication: Mobility status PT Visit Diagnosis: Unsteadiness on feet (R26.81);Other abnormalities of gait and mobility (R26.89);Muscle weakness (generalized) (M62.81);Difficulty in walking, not elsewhere classified (R26.2);Pain;Other symptoms and signs  involving the nervous system (R29.898) Pain - part of body: (Back)     Time: 2595-6387 PT Time Calculation (min) (ACUTE ONLY): 30 min  Charges:  $Gait Training: 23-37 mins           Elwin Mocha, S-DPT [facility] [phone]            12/20/2017, 6:37 PM

## 2017-12-21 DIAGNOSIS — R109 Unspecified abdominal pain: Secondary | ICD-10-CM

## 2017-12-21 LAB — BASIC METABOLIC PANEL
ANION GAP: 13 (ref 5–15)
BUN: 23 mg/dL — ABNORMAL HIGH (ref 6–20)
CO2: 28 mmol/L (ref 22–32)
Calcium: 9.8 mg/dL (ref 8.9–10.3)
Chloride: 93 mmol/L — ABNORMAL LOW (ref 98–111)
Creatinine, Ser: 0.79 mg/dL (ref 0.44–1.00)
GFR calc Af Amer: >60 mL/min (ref >60)
GLUCOSE: 118 mg/dL — AB (ref 70–99)
Potassium: 4.4 mmol/L (ref 3.5–5.1)
Sodium: 134 mmol/L — ABNORMAL LOW (ref 135–145)

## 2017-12-21 LAB — ACTH STIMULATION, 3 TIME POINTS
CORTISOL 60 MIN: 15.5 ug/dL
Cortisol, 30 Min: 12.8 ug/dL
Cortisol, Base: 2.7 ug/dL

## 2017-12-21 LAB — URINALYSIS, ROUTINE W REFLEX MICROSCOPIC
BILIRUBIN URINE: NEGATIVE
GLUCOSE, UA: NEGATIVE mg/dL
KETONES UR: NEGATIVE mg/dL
Nitrite: NEGATIVE
PH: 5 (ref 5.0–8.0)
Protein, ur: NEGATIVE mg/dL
Specific Gravity, Urine: 1.023 (ref 1.005–1.030)

## 2017-12-21 MED ORDER — TAMSULOSIN HCL 0.4 MG PO CAPS
0.4000 mg | ORAL_CAPSULE | Freq: Every day | ORAL | Status: DC
  Administered 2017-12-21 – 2018-01-30 (×38): 0.4 mg via ORAL
  Filled 2017-12-21 (×41): qty 1

## 2017-12-21 NOTE — Progress Notes (Signed)
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  Patient also complained of back pain.  MRI of the lumbar spine suggested epidural abscess.  Patient was transferred to [facility] and underwent lumbar laminectomy for drainage of the epidural abscess.  Consultants: Infectious disease.  Neurosurgery.  Cardiology  Procedures:   L3, L4, L5 and S1 laminectomy for drainage of lumbar epidural abscess on 7/25  Transthoracic echocardiogram Study Conclusions  - Left ventricle: The cavity size was normal. Systolic function was normal. The estimated ejection fraction was in the range of 55% to 60%. Wall motion was normal; there were no regional wall motion abnormalities. Left ventricular diastolic function parameters were normal. - Aortic valve: Transvalvular velocity was within the normal range. There was no stenosis. There was no regurgitation. - Mitral valve: Thickening of the anterior leaflet. Transvalvular velocity was within the normal range. There was no evidence for stenosis. There was trivial regurgitation. - Left atrium: The atrium was mildly dilated. - Right ventricle: The cavity size was normal. Wall thickness was normal. Systolic function was normal. - Right atrium: The atrium was severely dilated. - Tricuspid valve: There was a medium-sized, 1.0 cm (W) x 1.7 cm (L) vegetation on the septal leaflet. There was severe regurgitation. - Pulmonary arteries: Systolic pressure was mildly increased. PA peak pressure: 42 mm Hg  (S).  Impressions:  - Vegetation noted on the septal leaflet of the tricuspid valve consistent with endocarditis. Cannot exlude vegetation on the anterior leaflet of the mitral valve. Suggest TEE to better assess.  Antibiotics: Cefazolin  Subjective/Interval History: Patient had some difficulty urinating last night but was finally able to do so.  She is going to try again this morning.  Complains of left-sided flank and back pain.  Reports a history of kidney stone.      Objective:  Vital Signs  Vitals:   12/20/17 1338 12/20/17 2014 12/21/17 0500 12/21/17 0520  BP: 100/70 110/72  (!) 101/59  Pulse: 84 90  69  Resp:  17  18  Temp: 99 F (37.2 C) 98 F (36.7 C)  97.7 F (36.5 C)  TempSrc: Oral Oral  Oral  SpO2: 96% 99%  98%  Weight:   83 kg   Height:        Intake/Output Summary (Last 24 hours) at 12/21/2017 1057 Last data filed at 12/21/2017 0942 Gross per 24 hour  Intake 560 ml  Output 1600 ml  Net -1040 ml   Filed Weights   12/19/17 0300 12/20/17 0637 12/21/17 0500  Weight: 82.8 kg 82.5 kg 83 kg    General appearance: Awake alert.  In no distress. Resp: Clear to auscultation bilaterally.  Normal effort Cardio: S1-S2 is normal regular.  No S3-S4.  No rubs murmurs or bruit GI: Abdomen is soft.  Nontender nondistended.  Bowel sounds are present.  No masses organomegaly.  No left CVA tenderness.  No lesions noted in the left flank area. Neurologic: Able to move both her lower extremities  Lab Results:  Data Reviewed: I have personally reviewed following labs and imaging studies  CBC: Recent Labs  Lab 12/16/17 0450 12/18/17 0529  WBC 10.0  9.2  NEUTROABS 6.8 6.5  HGB 7.9* 8.0*  HCT 25.1* 25.9*  MCV 88.7 88.1  PLT 616* 689*    Basic Metabolic Panel: Recent Labs  Lab 12/18/17 0529 12/18/17 1401 12/19/17 0507 12/20/17 0344 12/21/17 0343  NA 131* 132* 132* 134* 134*  K 5.5* 5.0 5.5* 5.0 4.4  CL 91* 92* 92* 93* 93*  CO2 29 29 29 29 28    GLUCOSE 105* 112* 89 105* 118*  BUN 18 20 26* 23* 23*  CREATININE 0.64 0.77 0.80 0.78 0.79  CALCIUM 9.6 9.8 9.8 9.9 9.8    GFR: Estimated Creatinine Clearance: 110.8 mL/min (by C-G formula based on SCr of 0.79 mg/dL).  Radiology Studies: No results found.   Medications:  Scheduled: . acetaminophen  650 mg Oral Q6H  . bupivacaine liposome  20 mL Infiltration Once  . bupivacaine-EPINEPHrine  50 mL Infiltration Once  . Chlorhexidine Gluconate Cloth  6 each Topical Q2200  . cyclobenzaprine  10 mg Oral TID  . docusate sodium  100 mg Oral BID  . guaiFENesin  1,200 mg Oral BID  . heparin  5,000 Units Subcutaneous Q8H  . lactobacillus  1 g Oral TID WC  . lidocaine  1 patch Transdermal Q24H  . magnesium oxide  400 mg Oral BID  . methadone  60 mg Oral Daily  . nicotine  21 mg Transdermal Daily  . nystatin cream   Topical BID  . pantoprazole  40 mg Oral Daily  . senna-docusate  1 tablet Oral BID  . sodium chloride flush  10-40 mL Intracatheter Q12H   Continuous: .  ceFAZolin (ANCEF) IV 2 g (12/21/17 0555)  . 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  Infectious disease was consulted.  Patient remains on Cefazolin. RPR nonreactive.  HIV nonreactive.  Hepatitis C RNA by PCR negative.  Hepatitis A and B neg. Echocardiogram suggests severe tricuspid regurgitation.  Cardiology was consulted.  They recommend completing treatment with IV antibiotics and repeat echocardiogram afterwards to determine if she will need further intervention.  No plans for TEE currently.  Per ID she will need 8 weeks of IV cefazolin through September 18.  And may  need oral treatment subsequently.    Lumbar epidural abscess She is status post lumbar laminectomy and drainage of abscess on 7/25.  Patient developed urinary retention and required Foley catheterization.  She is experiencing some stool incontinence as well.  Does not appear to have any new neurological deficits.  Patient and her mother were very concerned about these.  Patient was reevaluated by neurosurgery. Apparently these issues will take a long time to resolve.  Foley catheter was removed yesterday evening.  She did have some difficulty urinating overnight but was finally able to do so.  We will continue to monitor for now.  Hopefully try and avoid placing a Foley back in.  Consider Flomax.  Left back and flank pain Patient states that this feels different from her previous back pain for which she required surgery.  Reports a history of kidney stones.  She had a CT scan of her abdomen done on July 22 which showed a right renal calculi.  Unlikely she could have developed a right kidney stone in just 2 weeks.  However  we will start off by doing a UA.  Pain could still be related to recent surgery.  UA showed small hemoglobin with 0-5 RBCs.  Unlikely her symptoms are due to nephrolithiasis.  Hold off on imaging studies for now.  Hyperkalemia/Low Cortisol Potassium level normal today.  However she did get another dose of Kayexalate yesterday.  Reason for hyperkalemia is not entirely clear.  She is not noted to be on any medications which can raise the potassium level.  This could be pseudohyperkalemia.  However patient's sodium also noted to be low.  Cortisol level was checked which is noted to be low.  Patient underwent a ACTH stimulation test this morning.  Her cortisol level did increase from a basal value of 2.7 to a 60-minute value of 15.5.  This is a suboptimal response but not remarkably abnormal either.  We will check thyroid function tests and ACTH levels.  At this time we will not initiate her  on any medications.  We will continue to monitor her electrolytes.  Once ACTH level is available we will need to discuss this with endocrinology.   History of scoliosis status post previous surgery/chronic back pain Patient mentioned that she takes methadone prescribed by an outpatient clinic.  [facility] prescriber database was queried and did not show any prescriptions for methadone. [facility] in [place] was called.  Apparently patient was being given 80 mg of methadone at the clinic (no prescription) starting in April and last dose was on October 25, 2017.  But patient has not returned to the clinic since that and so she was discharged.  And since no prescription was written, this information apparently does not show up on the [place] prescribers database.  Patient was started on methadone here in the hospital. Currently on 60 mg.  Her pain seems to be better controlled.  She did mention that the patient's mother has contacted the clinic and they have agreed to take her back once she is discharged from the hospital.  So we will leave her on current dose of methadone.  Patient was told that we will be unable to prescribe this medication at discharge.  Mild acute renal failure with hyponatremia Renal function is normal.  She is noted to have mild hyponatremia which has been stable.  Polysubstance abuse Will need ongoing counseling and outpatient rehab.  Patient to follow-up with the rehab clinic post discharge.  They will take up the responsibility of prescribing methadone.  Vaginal candidiasis Continue topical agents.  Urinary retention Currently has Foley catheter.  Per neurosurgery her bowel and bladder issues will eventually resolve but nothing to be done for it right now.  Patient agreed to voiding trial yesterday.  Foley catheter was discontinued.  Continues to have some difficulty urinating.  Will initiate Flomax.  Hopefully will avoid placing Foley back in.  Normocytic anemia No  evidence for overt bleeding.  Continue to watch.  Anasarca Seems to have improved.  Her weight has decreased significantly.  DVT Prophylaxis: Subcutaneous heparin    Code Status: Full code Family Communication: Discussed with the patient Disposition Plan: Management as outlined above.  Will likely remain in the hospital for duration of treatment which will be till September 18.    LOS: 19 days   [facility] Pager [phone] 12/21/2017, 10:57 AM  If 7PM-7AM, please contact night-coverage at [email or web address], password [facility]

## 2017-12-21 NOTE — Progress Notes (Signed)
Pt couldn't void. Bladder scan showed 525m. Made NP on call aware. In and out x1 ordered. Went back to pt's room, pt able to void 1538m Will hold off in and out right now. Will continue to monitor pt.

## 2017-12-22 LAB — BASIC METABOLIC PANEL
ANION GAP: 11 (ref 5–15)
BUN: 21 mg/dL — AB (ref 6–20)
CALCIUM: 9.8 mg/dL (ref 8.9–10.3)
CO2: 29 mmol/L (ref 22–32)
Chloride: 92 mmol/L — ABNORMAL LOW (ref 98–111)
Creatinine, Ser: 0.68 mg/dL (ref 0.44–1.00)
GFR calc Af Amer: >60 mL/min (ref >60)
GFR calc non Af Amer: >60 mL/min (ref >60)
GLUCOSE: 98 mg/dL (ref 70–99)
Potassium: 4.7 mmol/L (ref 3.5–5.1)
Sodium: 132 mmol/L — ABNORMAL LOW (ref 135–145)

## 2017-12-22 LAB — TSH: TSH: 4.219 u[IU]/mL (ref 0.350–4.500)

## 2017-12-22 LAB — T4, FREE: Free T4: 0.87 ng/dL (ref 0.82–1.77)

## 2017-12-22 MED ORDER — METHOCARBAMOL 1000 MG/10ML IJ SOLN
500.0000 mg | Freq: Four times a day (QID) | INTRAVENOUS | Status: DC | PRN
  Administered 2017-12-23 – 2018-01-07 (×6): 500 mg via INTRAVENOUS
  Filled 2017-12-22 (×2): qty 5
  Filled 2017-12-22 (×2): qty 500
  Filled 2017-12-22 (×2): qty 5
  Filled 2017-12-22: qty 500
  Filled 2017-12-22: qty 5
  Filled 2017-12-22: qty 500
  Filled 2017-12-22: qty 5

## 2017-12-22 MED ORDER — FLUTICASONE PROPIONATE 50 MCG/ACT NA SUSP
2.0000 | Freq: Every day | NASAL | Status: DC
  Administered 2017-12-22 – 2018-01-05 (×10): 2 via NASAL
  Filled 2017-12-22: qty 16

## 2017-12-22 NOTE — Progress Notes (Signed)
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  Patient also complained of back pain.  MRI of the lumbar spine suggested epidural abscess.  Patient was transferred to [facility] and underwent lumbar laminectomy for drainage of the epidural abscess.  Consultants: Infectious disease.  Neurosurgery.  Cardiology  Procedures:   L3, L4, L5 and S1 laminectomy for drainage of lumbar epidural abscess on 7/25  Transthoracic echocardiogram Study Conclusions  - Left ventricle: The cavity size was normal. Systolic function was normal. The estimated ejection fraction was in the range of 55% to 60%. Wall motion was normal; there were no regional wall motion abnormalities. Left ventricular diastolic function parameters were normal. - Aortic valve: Transvalvular velocity was within the normal range. There was no stenosis. There was no regurgitation. - Mitral valve: Thickening of the anterior leaflet. Transvalvular velocity was within the normal range. There was no evidence for stenosis. There was trivial regurgitation. - Left atrium: The atrium was mildly dilated. - Right ventricle: The cavity size was normal. Wall thickness was normal. Systolic function was normal. - Right atrium: The atrium was severely dilated. - Tricuspid valve: There was a medium-sized, 1.0 cm (W) x 1.7 cm (L) vegetation on the septal leaflet. There was severe regurgitation. - Pulmonary arteries: Systolic pressure was mildly increased. PA peak pressure: 42 mm Hg  (S).  Impressions:  - Vegetation noted on the septal leaflet of the tricuspid valve consistent with endocarditis. Cannot exlude vegetation on the anterior leaflet of the mitral valve. Suggest TEE to better assess.  Antibiotics: Cefazolin  Subjective/Interval History: Patient apparently had a rough night.  Complains of back pain and headache this morning.  Denies any weakness on any one side.  Her Father is at the bedside.     Objective:  Vital Signs  Vitals:   12/21/17 2116 12/22/17 0422 12/22/17 0500 12/22/17 0620  BP: 100/72 91/63  103/71  Pulse: 75 86    Resp:  20    Temp: 98.3 F (36.8 C) 98 F (36.7 C)    TempSrc: Oral Oral    SpO2: 96% 100%    Weight:   84.5 kg   Height:        Intake/Output Summary (Last 24 hours) at 12/22/2017 1124 Last data filed at 12/22/2017 0945 Gross per 24 hour  Intake 672 ml  Output 301 ml  Net 371 ml   Filed Weights   12/20/17 0637 12/21/17 0500 12/22/17 0500  Weight: 82.5 kg 83 kg 84.5 kg    General appearance: Awake alert.  Appears to be in discomfort.  In no distress. Resp: Clear to auscultation bilaterally.  Normal effort. Cardio: S1-S2 is normal regular.  No S3-S4.  No rubs murmurs or bruit GI: Remains soft.  Nontender nondistended.   Neurologic: No facial asymmetry.  Moving all extremities.  Lab Results:  Data Reviewed: I have personally reviewed following labs and imaging studies  CBC: Recent Labs  Lab 12/16/17 0450 12/18/17 0529  WBC 10.0 9.2  NEUTROABS 6.8 6.5  HGB 7.9* 8.0*  HCT 25.1* 25.9*  MCV 88.7 88.1  PLT 616* 689*    Basic Metabolic Panel: Recent Labs  Lab 12/18/17 1401 12/19/17 0507 12/20/17 0344 12/21/17 0343 12/22/17 0417  NA 132* 132* 134* 134* 132*  K 5.0 5.5* 5.0 4.4 4.7  CL 92* 92* 93* 93* 92*  CO2 29 29 29 28 29   GLUCOSE 112* 89 105* 118* 98  BUN 20 26* 23* 23* 21*  CREATININE 0.77 0.80 0.78 0.79 0.68  CALCIUM 9.8 9.8 9.9 9.8 9.8    GFR: Estimated Creatinine  Clearance: 111.8 mL/min (by C-G formula based on SCr of 0.68 mg/dL).  Radiology Studies: No results found.   Medications:  Scheduled: . acetaminophen  650 mg Oral Q6H  . bupivacaine liposome  20 mL Infiltration Once  . bupivacaine-EPINEPHrine  50 mL Infiltration Once  . Chlorhexidine Gluconate Cloth  6 each Topical Q2200  . cyclobenzaprine  10 mg Oral TID  . docusate sodium  100 mg Oral BID  . fluticasone  2 spray Each Nare Daily  . guaiFENesin  1,200 mg Oral BID  . heparin  5,000 Units Subcutaneous Q8H  . lactobacillus  1 g Oral TID WC  . lidocaine  1 patch Transdermal Q24H  . magnesium oxide  400 mg Oral BID  . methadone  60 mg Oral Daily  . nicotine  21 mg Transdermal Daily  . nystatin cream   Topical BID  . pantoprazole  40 mg Oral Daily  . senna-docusate  1 tablet Oral BID  . sodium chloride flush  10-40 mL Intracatheter Q12H  . tamsulosin  0.4 mg Oral Daily   Continuous: .  ceFAZolin (ANCEF) IV 2 g (12/22/17 0610)  . lactated ringers 10 mL/hr at 12/20/17 0600  . 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  Infectious disease was consulted.  Patient remains on Cefazolin. RPR nonreactive.  HIV nonreactive.  Hepatitis C RNA by PCR negative.  Hepatitis A and B neg. Echocardiogram suggests severe tricuspid regurgitation.  Cardiology was consulted.  They recommend completing treatment with IV antibiotics and repeat echocardiogram afterwards to determine if she will need further intervention.  No plans for TEE currently.  Per ID she will need 8 weeks of IV cefazolin through September 18.  And may need oral treatment  subsequently.    Lumbar epidural abscess She is status post lumbar laminectomy and drainage of abscess on 7/25.  Patient developed urinary retention and required Foley catheterization.  She is experiencing some stool incontinence as well.  Does not appear to have any new neurological deficits.  Patient and her mother were very concerned about these.  Patient was reevaluated by neurosurgery. Apparently these issues will take a long time to resolve.  Foley catheter was removed on 8/9.  It looks like she has been able to urinate on her own.  Continue Flomax for now.    Left back and flank pain Patient states that this feels different from her previous back pain for which she required surgery.  Reports a history of kidney stones.  She had a CT scan of her abdomen done on July 22 which showed a right renal calculi.  Unlikely she could have developed a right kidney stone in just 2 weeks.  UA showed small hemoglobin with 0-5 RBCs.  Unlikely her symptoms are due to nephrolithiasis.  Worsening pain likely due  to all the activity patient did yesterday.  Vital signs are all stable.  Blood work is stable.  Treat with pain medications.  Monitor for now.  Hyperkalemia/Low Cortisol Potassium level normal today.  However she did get another dose of Kayexalate yesterday.  Reason for hyperkalemia is not entirely clear.  She is not noted to be on any medications which can raise the potassium level.  This could be pseudohyperkalemia.  However patient's sodium also noted to be low.  Cortisol level was checked which is noted to be low.  Patient underwent a ACTH stimulation test this morning.  Her cortisol level did increase from a basal value of 2.7 to a 60-minute value of 15.5.  This is a suboptimal response but not remarkably abnormal either.  Thyroid function tests are normal.  ACTH level is pending.  At this time there is no clear indication to initiate steroids.  Continue to monitor her electrolytes.  Await ACTH levels.   May need to discuss this with endocrinology if her potassium levels continued to stay high.     History of scoliosis status post previous surgery/chronic back pain Patient mentioned that she takes methadone prescribed by an outpatient clinic.  [facility] prescriber database was queried and did not show any prescriptions for methadone. [facility] in [place] was called.  Apparently patient was being given 80 mg of methadone at the clinic (no prescription) starting in April and last dose was on October 25, 2017.  But patient has not returned to the clinic since that and so she was discharged.  And since no prescription was written, this information apparently does not show up on the [place] prescribers database.  Patient was started on methadone here in the hospital. Currently on 60 mg.  Her pain seems to be better controlled.  She did mention that the patient's mother has contacted the clinic and they have agreed to take her back once she is discharged from the hospital.  So we will leave her on current dose of methadone.  Patient was told that we will be unable to prescribe this medication at discharge.  Mild acute renal failure with hyponatremia Renal function is normal.  She is noted to have mild hyponatremia which has been stable.  See above as well.  Polysubstance abuse Will need ongoing counseling and outpatient rehab.  Patient to follow-up with the rehab clinic post discharge.  They will take up the responsibility of prescribing methadone.  Vaginal candidiasis Continue topical agents.  Urinary retention Currently has Foley catheter.  Per neurosurgery her bowel and bladder issues will eventually resolve but nothing to be done for it right now.  Patient agreed to voiding trial.  Foley catheter was discontinued on 8/9.  Is able to urinate on own.  Continue Flomax.  Bladder scans as needed.  Normocytic anemia No evidence for overt bleeding.  Continue to watch.  Tomorrow.  Anasarca Seems  to have improved.  Her weight has decreased significantly.  DVT Prophylaxis: Subcutaneous heparin    Code Status: Full code Family Communication: Discussed with the patient Disposition Plan: Management as outlined above.  Will likely remain in the hospital for duration of treatment which will be till September 18.    LOS: 20 days   [facility] Pager [phone] 12/22/2017, 11:24 AM  If 7PM-7AM, please contact night-coverage at [email or web address], password [email or web address]

## 2017-12-22 NOTE — Plan of Care (Signed)
  Problem: Health Behavior/Discharge Planning: Goal: Ability to manage health-related needs will improve Outcome: Progressing   Problem: Clinical Measurements: Goal: Ability to maintain clinical measurements within normal limits will improve Outcome: Progressing Goal: Will remain free from infection Outcome: Progressing   Problem: Activity: Goal: Risk for activity intolerance will decrease Outcome: Progressing   Problem: Pain Managment: Goal: General experience of comfort will improve Outcome: Progressing

## 2017-12-23 LAB — BASIC METABOLIC PANEL
Anion gap: 9 (ref 5–15)
BUN: 24 mg/dL — AB (ref 6–20)
CALCIUM: 9.7 mg/dL (ref 8.9–10.3)
CO2: 30 mmol/L (ref 22–32)
CREATININE: 0.85 mg/dL (ref 0.44–1.00)
Chloride: 95 mmol/L — ABNORMAL LOW (ref 98–111)
GFR calc Af Amer: >60 mL/min (ref >60)
GLUCOSE: 110 mg/dL — AB (ref 70–99)
POTASSIUM: 4.5 mmol/L (ref 3.5–5.1)
SODIUM: 134 mmol/L — AB (ref 135–145)

## 2017-12-23 LAB — CBC
HCT: 26.8 % — ABNORMAL LOW (ref 36.0–46.0)
Hemoglobin: 8.2 g/dL — ABNORMAL LOW (ref 12.0–15.0)
MCH: 27.2 pg (ref 26.0–34.0)
MCHC: 30.6 g/dL (ref 30.0–36.0)
MCV: 88.7 fL (ref 78.0–100.0)
PLATELETS: 622 10*3/uL — AB (ref 150–400)
RBC: 3.02 MIL/uL — AB (ref 3.87–5.11)
RDW: 14.9 % (ref 11.5–15.5)
WBC: 8.5 10*3/uL (ref 4.0–10.5)

## 2017-12-23 LAB — ACTH: C206 ACTH: 19.3 pg/mL (ref 7.2–63.3)

## 2017-12-23 MED ORDER — DICLOFENAC SODIUM 1 % TD GEL
2.0000 g | Freq: Four times a day (QID) | TRANSDERMAL | Status: DC
  Administered 2017-12-24 – 2018-01-08 (×19): 2 g via TOPICAL
  Filled 2017-12-23 (×2): qty 100

## 2017-12-23 NOTE — Progress Notes (Signed)
Physical Therapy Treatment Patient Details Name: Kelly [name] MRN: [id number] DOB: [birth year] Today's Date: 12/23/2017    History of Present Illness Kelly [name] is a 32 y.o. F with sepsis, tachycardia, tachypnea, leukocytosis, hypotension.  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 PMH of polysubstance abuse, ADD, scoliosis sp back surgery (20 years ago per mom). PICC placed 12/08/17.    PT Comments    Pt very motivated, ambulated 220' with 2 seated rest breaks, with min guard A and RW with +2 for chair behind. Pt with decreased L ankle and knee control but no buckling as well as R foot landing in inversion at foot strike. Working on ambulating with more erect posture. PT will continue to follow.    Follow Up Recommendations  CIR     Equipment Recommendations  Rolling walker with 5" wheels;3in1 (PT)    Recommendations for Other Services Rehab consult     Precautions / Restrictions Precautions Precautions: Back;Fall Precaution Booklet Issued: No Restrictions Weight Bearing Restrictions: No    Mobility  Bed Mobility               General bed mobility comments: pt received in chair  Transfers Overall transfer level: Needs assistance Equipment used: Rolling walker (2 wheeled) Transfers: Sit to/from Stand Sit to Stand: Min guard;Min assist         General transfer comment: pt able to stand first 2 times with min-guard A. Last time she stood, she was fatigued and needed min A for power up  Ambulation/Gait Ambulation/Gait assistance: Min guard;+2 safety/equipment(Chair follow) Gait Distance (Feet): 220 Feet(with 2 seated rest breaks) Assistive device: Rolling walker (2 wheeled) Gait Pattern/deviations: Step-through pattern;Decreased stride length;Narrow base of support;Trunk flexed Gait velocity: Decreased Gait velocity interpretation: <1.8 ft/sec, indicate of risk for  recurrent falls General Gait Details: raised RW up one notch and pt was able to maintain slightly more erect posture. L foot toed in and R foot landing with slight inversion and then coming to flat with WB'ing. Discussed possible need for AFO's or just ankle support down the road but not at this point. Pt reports tingling in BLE's   Stairs             Wheelchair Mobility    Modified Rankin (Stroke Patients Only)       Balance Overall balance assessment: Needs assistance Sitting-balance support: No upper extremity supported;Feet supported Sitting balance-Leahy Scale: Fair Sitting balance - Comments: has difficulty bringing self up to edge of chair   Standing balance support: Bilateral upper extremity supported Standing balance-Leahy Scale: Poor Standing balance comment: Pt reliant on BUE via RW for balance                            Cognition Arousal/Alertness: Awake/alert Behavior During Therapy: WFL for tasks assessed/performed Overall Cognitive Status: Within Functional Limits for tasks assessed                                        Exercises Other Exercises Other Exercises: had pt work on a combo of LAQ with AP's once knee extended, working on keeping foot in midline throughout ROM (difficult on L especially) Other Exercises: seated B foot abduction and adduction keeping heels together, again cannot abduct L foot equal to R  General Comments General comments (skin integrity, edema, etc.): donned shoes for ambulation, needed total assist for L shoe as she was unable to push foot into it due to weakness. Was able to press R foot in and then therapist continued task of tying      Pertinent Vitals/Pain Pain Assessment: Faces Faces Pain Scale: Hurts even more Pain Location: back and L hip Pain Descriptors / Indicators: Grimacing;Guarding;Aching Pain Intervention(s): Limited activity within patient's tolerance;Monitored during session     Home Living                      Prior Function            PT Goals (current goals can now be found in the care plan section) Acute Rehab PT Goals Patient Stated Goal: To be able to walk independently PT Goal Formulation: With patient Time For Goal Achievement: 01/03/18 Potential to Achieve Goals: Good Progress towards PT goals: Progressing toward goals    Frequency    Min 3X/week      PT Plan Current plan remains appropriate    Co-evaluation              AM-PAC PT "6 Clicks" Daily Activity  Outcome Measure  Difficulty turning over in bed (including adjusting bedclothes, sheets and blankets)?: A Lot Difficulty moving from lying on back to sitting on the side of the bed? : A Lot Difficulty sitting down on and standing up from a chair with arms (e.g., wheelchair, bedside commode, etc,.)?: Unable Help needed moving to and from a bed to chair (including a wheelchair)?: A Little Help needed walking in hospital room?: A Little Help needed climbing 3-5 steps with a railing? : A Lot 6 Click Score: 13    End of Session Equipment Utilized During Treatment: Gait belt Activity Tolerance: Patient tolerated treatment well Patient left: with call bell/phone within reach;in chair Nurse Communication: Mobility status PT Visit Diagnosis: Unsteadiness on feet (R26.81);Other abnormalities of gait and mobility (R26.89);Muscle weakness (generalized) (M62.81);Difficulty in walking, not elsewhere classified (R26.2);Pain;Other symptoms and signs involving the nervous system (R29.898) Pain - Right/Left: (back) Pain - part of body: (Back)     Time: 3500-9381 PT Time Calculation (min) (ACUTE ONLY): 39 min  Charges:  $Gait Training: 23-37 mins $Therapeutic Exercise: 8-22 mins                     [place]  Roy 12/23/2017, 12:59 PM

## 2017-12-23 NOTE — Progress Notes (Signed)
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 Patient also complained of back pain. MRI of the lumbar spine suggested epidural abscess. Patient was transferred to [facility] and underwent lumbar laminectomy for drainage of the epidural abscess.  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  Infectious disease was consulted.   Patient remains on Cefazolin.  RPR nonreactive. HIV nonreactive. Hepatitis C RNA by PCR negative.  Hepatitis A and B neg.  Echocardiogram suggests severe tricuspid regurgitation.  Cardiology was consulted. They recommend completing treatment with IV antibiotics and repeat echocardiogram afterwards to determine if she will need further intervention. No plans for TEE currently. Per ID she will need 8 weeks of IV cefazolin through September 18. And may need oral treatment subsequently.    Lumbar epidural abscess She is status post lumbar laminectomy and drainage of abscess on 7/25.   Patient developed urinary retention and required Foley catheterization, but foley now removed.   She is experiencing some stool incontinence  as well.   Has had some LLE weakness since that time Does not appear to have any new neurological deficits.   Foley catheter was removed on 8/9.  Continue flomax Pt with persistent left back pain, will follow MRI lumbar spine and pelvis (concern for partially imaged L sacral osteomyelitis on previous lumbar imaging - and pt pain was near L SI joint)    Left back and flank pain UA with WBC's, negative nitrite, mod LE (in setting of recent foley placement).  Small RBC, 0-5.  Repeat imaging as noted above, follow   Hyperkalemia/Low Cortisol Potassium level improved today.   Received kayexalate 8/9.   Patient's sodium also noted to be low.   Cortisol level was checked which is noted to be low.  Patient underwent a ACTH stimulation test this morning.  Her cortisol level did increase from a basal value of 2.7 to a 60-minute value of 15.5.  This is a suboptimal response but not remarkably abnormal either.   Thyroid function tests are normal.   ACTH level is pending.  At this time there is no clear indication to initiate steroids.  Continue to monitor her electrolytes.  Await ACTH levels.  May need to discuss this with endocrinology if her potassium levels continued to stay high.     History of scoliosis status post previous surgery/chronic back pain Patient mentioned that she takes methadone prescribed by an outpatient clinic. [facility] prescriber database was queried and did not show any prescriptions for methadone. [facility] in [place] was called by previous provider. Apparently patient was being given 80 mg of methadone at the clinic (no prescription) starting in April and last dose was on October 25, 2017. But patient has not returned to the clinic since that and so she was discharged. And since no prescription was written, this information apparently does not show up on the [facility] prescribers database.  Patient was started on methadone here in the hospital. Currently on 60 mg.  Her  pain seems to be better controlled.  She did mention that the patient's mother has contacted the clinic and they have agreed to take her back once she is discharged from the hospital.  So we will leave her on current dose of methadone.  Patient was told that we will be unable to prescribe this medication at discharge.  Mild acute renal failure with hyponatremia Renal function is normal.  She is noted to have mild hyponatremia which has been stable.  See above as well.  Polysubstance abuse Will need ongoing counseling and outpatient rehab.  Patient to follow-up with the rehab clinic post discharge.  They will take up the responsibility of prescribing methadone.  Vaginal candidiasis Continue topical agents.  Urinary retention Currently has Foley catheter.  Per neurosurgery her bowel and bladder issues will eventually resolve but nothing to be done for it right now.  Patient agreed to voiding trial.  Foley catheter was discontinued on 8/9.  Is able to urinate on own.  Continue Flomax.  Bladder scans as needed.  Stable.  Normocytic anemia Stable, follow   Anasarca Improved, follow   DVT prophylaxis: heparin Code Status: full  Family Communication: mother at bedside Disposition Plan: pending   Consultants:   ID  Neurosurgery  Cardiology  Procedures:  Study Conclusions  - Left ventricle: The cavity size was normal. Systolic function was   normal. The estimated ejection fraction was in the range of 55%   to 60%. Wall motion was normal; there were no regional wall   motion abnormalities. Left ventricular diastolic function   parameters were normal. - Aortic valve: Transvalvular velocity was within the normal range.   There was no stenosis. There was no regurgitation. - Mitral valve: Thickening of the anterior leaflet. Transvalvular   velocity was within the normal range. There was no evidence for   stenosis. There was trivial regurgitation. - Left atrium: The atrium was  mildly dilated. - Right ventricle: The cavity size was normal. Wall thickness was   normal. Systolic function was normal. - Right atrium: The atrium was severely dilated. - Tricuspid valve: There was a medium-sized, 1.0 cm (W) x 1.7 cm   (L) vegetation on the septal leaflet. There was severe   regurgitation. - Pulmonary arteries: Systolic pressure was mildly increased. PA   peak pressure: 42 mm Hg (S).  Impressions:  - Vegetation noted on the septal leaflet of the tricuspid valve   consistent with endocarditis. Cannot exlude vegetation on the   anterior leaflet of the mitral valve. Suggest TEE to better   assess.  Antimicrobials:  Anti-infectives (From admission, onward)   Start     Dose/Rate Route Frequency Ordered Stop   12/06/17 2200  ceFAZolin (ANCEF) IVPB 2g/100 mL premix     2 g 200 mL/hr over 30 Minutes Intravenous Every 8 hours 12/06/17 1016 01/29/18 2359   12/06/17 0600  vancomycin (VANCOCIN) IVPB 1000 mg/200 mL premix  Status:  Discontinued     1,000 mg 200 mL/hr over 60 Minutes Intravenous Every 8 hours 12/05/17 2316 12/06/17 1200   12/06/17 0000  cefTRIAXone (ROCEPHIN) 2 g in sodium chloride 0.9 % 100 mL IVPB  Status:  Discontinued  2 g 200 mL/hr over 30 Minutes Intravenous Every 24 hours 12/05/17 2238 12/06/17 1016   12/05/17 2300  vancomycin (VANCOCIN) 1,750 mg in sodium chloride 0.9 % 500 mL IVPB     1,750 mg 250 mL/hr over 120 Minutes Intravenous  Once 12/05/17 2246 12/06/17 0228   12/05/17 2055  vancomycin (VANCOCIN) powder  Status:  Discontinued       As needed 12/05/17 2055 12/05/17 2118   12/05/17 2001  bacitracin 50,000 Units in sodium chloride 0.9 % 500 mL irrigation  Status:  Discontinued       As needed 12/05/17 2002 12/05/17 2118   12/03/17 1600  azithromycin (ZITHROMAX) 500 mg in sodium chloride 0.9 % 250 mL IVPB  Status:  Discontinued     500 mg 250 mL/hr over 60 Minutes Intravenous Every 24 hours 12/02/17 1706 12/03/17 0948   12/03/17 1400   ceFAZolin (ANCEF) IVPB 2g/100 mL premix  Status:  Discontinued     2 g 200 mL/hr over 30 Minutes Intravenous Every 8 hours 12/03/17 0948 12/05/17 2238   12/03/17 1000  vancomycin (VANCOCIN) IVPB 750 mg/150 ml premix  Status:  Discontinued     750 mg 150 mL/hr over 60 Minutes Intravenous Every 12 hours 12/02/17 1710 12/03/17 0948   12/02/17 2200  piperacillin-tazobactam (ZOSYN) IVPB 3.375 g  Status:  Discontinued     3.375 g 12.5 mL/hr over 240 Minutes Intravenous Every 8 hours 12/02/17 1710 12/03/17 0948   12/02/17 1800  vancomycin (VANCOCIN) 1,500 mg in sodium chloride 0.9 % 500 mL IVPB     1,500 mg 250 mL/hr over 120 Minutes Intravenous  Once 12/02/17 1710 12/02/17 2110   12/02/17 1530  cefTRIAXone (ROCEPHIN) 1 g in sodium chloride 0.9 % 100 mL IVPB     1 g 200 mL/hr over 30 Minutes Intravenous  Once 12/02/17 1522 12/02/17 1626   12/02/17 1530  azithromycin (ZITHROMAX) 500 mg in sodium chloride 0.9 % 250 mL IVPB     500 mg 250 mL/hr over 60 Minutes Intravenous  Once 12/02/17 1522 12/02/17 1759     Subjective: C/o L sided low back pain. Persistent.  Objective: Vitals:   12/22/17 1304 12/22/17 2035 12/23/17 0543 12/23/17 1423  BP: 112/78 101/68 115/72 114/71  Pulse: 85 96 84 92  Resp: 17 (!) 22 20 16   Temp: 98.1 F (36.7 C) 98.7 F (37.1 C) 98.6 F (37 C) 97.6 F (36.4 C)  TempSrc: Oral Oral Oral Oral  SpO2: 98% 98% 99% 99%  Weight:   78.5 kg   Height:        Intake/Output Summary (Last 24 hours) at 12/23/2017 1828 Last data filed at 12/23/2017 1806 Gross per 24 hour  Intake 950 ml  Output 600 ml  Net 350 ml   Filed Weights   12/21/17 0500 12/22/17 0500 12/23/17 0543  Weight: 83 kg 84.5 kg 78.5 kg    Examination:  General exam: Appears calm and comfortable  Respiratory system: Clear to auscultation. Respiratory effort normal. Cardiovascular system: S1 & S2 heard, RRR.  Gastrointestinal system: Abdomen is nondistended, soft and nontender. Central nervous  system: Alert and oriented. No focal neurological deficits. Extremities: Symmetric 5 x 5 power. MSK: ttp around L SI joint Skin: No rashes, lesions or ulcers Psychiatry: Judgement and insight appear normal. Mood & affect appropriate.     Data Reviewed: I have personally reviewed following labs and imaging studies  CBC: Recent Labs  Lab 12/18/17 0529 12/23/17 0358  WBC 9.2 8.5  NEUTROABS  6.5  --   HGB 8.0* 8.2*  HCT 25.9* 26.8*  MCV 88.1 88.7  PLT 689* 701*   Basic Metabolic Panel: Recent Labs  Lab 12/19/17 0507 12/20/17 0344 12/21/17 0343 12/22/17 0417 12/23/17 0358  NA 132* 134* 134* 132* 134*  K 5.5* 5.0 4.4 4.7 4.5  CL 92* 93* 93* 92* 95*  CO2 29 29 28 29 30   GLUCOSE 89 105* 118* 98 110*  BUN 26* 23* 23* 21* 24*  CREATININE 0.80 0.78 0.79 0.68 0.85  CALCIUM 9.8 9.9 9.8 9.8 9.7   GFR: Estimated Creatinine Clearance: 101.7 mL/min (by C-G formula based on SCr of 0.85 mg/dL). Liver Function Tests: Recent Labs  Lab 12/19/17 0507  AST 16  ALT 5  ALKPHOS 224*  BILITOT 0.6  PROT 8.4*  ALBUMIN 2.4*   No results for input(s): LIPASE, AMYLASE in the last 168 hours. No results for input(s): AMMONIA in the last 168 hours. Coagulation Profile: No results for input(s): INR, PROTIME in the last 168 hours. Cardiac Enzymes: No results for input(s): CKTOTAL, CKMB, CKMBINDEX, TROPONINI in the last 168 hours. BNP (last 3 results) No results for input(s): PROBNP in the last 8760 hours. HbA1C: No results for input(s): HGBA1C in the last 72 hours. CBG: No results for input(s): GLUCAP in the last 168 hours. Lipid Profile: No results for input(s): CHOL, HDL, LDLCALC, TRIG, CHOLHDL, LDLDIRECT in the last 72 hours. Thyroid Function Tests: Recent Labs    12/22/17 0417  TSH 4.219  FREET4 0.87   Anemia Panel: No results for input(s): VITAMINB12, FOLATE, FERRITIN, TIBC, IRON, RETICCTPCT in the last 72 hours. Sepsis Labs: No results for input(s): PROCALCITON,  LATICACIDVEN in the last 168 hours.  No results found for this or any previous visit (from the past 240 hour(s)).       Radiology Studies: No results found.      Scheduled Meds: . acetaminophen  650 mg Oral Q6H  . bupivacaine liposome  20 mL Infiltration Once  . bupivacaine-EPINEPHrine  50 mL Infiltration Once  . Chlorhexidine Gluconate Cloth  6 each Topical Q2200  . cyclobenzaprine  10 mg Oral TID  . docusate sodium  100 mg Oral BID  . fluticasone  2 spray Each Nare Daily  . guaiFENesin  1,200 mg Oral BID  . heparin  5,000 Units Subcutaneous Q8H  . lactobacillus  1 g Oral TID WC  . lidocaine  1 patch Transdermal Q24H  . magnesium oxide  400 mg Oral BID  . methadone  60 mg Oral Daily  . nicotine  21 mg Transdermal Daily  . nystatin cream   Topical BID  . pantoprazole  40 mg Oral Daily  . senna-docusate  1 tablet Oral BID  . sodium chloride flush  10-40 mL Intracatheter Q12H  . tamsulosin  0.4 mg Oral Daily   Continuous Infusions: .  ceFAZolin (ANCEF) IV 2 g (12/23/17 1416)  . lactated ringers 10 mL/hr at 12/20/17 0600  . methocarbamol (ROBAXIN) IV 500 mg (12/23/17 1806)     LOS: 21 days    Time spent: over 30 min MDM moderate with MSSA bacteremia with tricuspid valve endocarditis with septic pulm emboli with lumbar epidural abscess s/p drainage with persistent low back pain, plan to reimage.    Fayrene Helper, MD [facility] Pager [phone]  If 7PM-7AM, please contact night-coverage [email or web address] Password [facility] 12/23/2017, 6:28 PM

## 2017-12-24 ENCOUNTER — Inpatient Hospital Stay (HOSPITAL_COMMUNITY): Payer: Medicaid Other

## 2017-12-24 LAB — BASIC METABOLIC PANEL
Anion gap: 11 (ref 5–15)
BUN: 20 mg/dL (ref 6–20)
CO2: 29 mmol/L (ref 22–32)
Calcium: 9.8 mg/dL (ref 8.9–10.3)
Chloride: 94 mmol/L — ABNORMAL LOW (ref 98–111)
Creatinine, Ser: 0.73 mg/dL (ref 0.44–1.00)
Glucose, Bld: 103 mg/dL — ABNORMAL HIGH (ref 70–99)
POTASSIUM: 5.2 mmol/L — AB (ref 3.5–5.1)
Sodium: 134 mmol/L — ABNORMAL LOW (ref 135–145)

## 2017-12-24 LAB — CBC
HCT: 26.1 % — ABNORMAL LOW (ref 36.0–46.0)
Hemoglobin: 8.5 g/dL — ABNORMAL LOW (ref 12.0–15.0)
MCH: 28.6 pg (ref 26.0–34.0)
MCHC: 32.6 g/dL (ref 30.0–36.0)
MCV: 87.9 fL (ref 78.0–100.0)
PLATELETS: 578 10*3/uL — AB (ref 150–400)
RBC: 2.97 MIL/uL — AB (ref 3.87–5.11)
RDW: 14.8 % (ref 11.5–15.5)
WBC: 8.2 10*3/uL (ref 4.0–10.5)

## 2017-12-24 LAB — MAGNESIUM: MAGNESIUM: 1.9 mg/dL (ref 1.7–2.4)

## 2017-12-24 MED ORDER — NAPROXEN 250 MG PO TABS
500.0000 mg | ORAL_TABLET | Freq: Two times a day (BID) | ORAL | Status: DC | PRN
  Administered 2017-12-24 – 2018-01-25 (×9): 500 mg via ORAL
  Filled 2017-12-24 (×9): qty 2

## 2017-12-24 MED ORDER — HYDROMORPHONE HCL 1 MG/ML IJ SOLN
2.0000 mg | Freq: Once | INTRAMUSCULAR | Status: AC | PRN
  Administered 2017-12-25: 2 mg via INTRAVENOUS
  Filled 2017-12-24: qty 2

## 2017-12-24 MED ORDER — ACETAMINOPHEN 500 MG PO TABS
1000.0000 mg | ORAL_TABLET | Freq: Three times a day (TID) | ORAL | Status: DC
  Administered 2017-12-24 – 2018-01-30 (×111): 1000 mg via ORAL
  Filled 2017-12-24 (×111): qty 2

## 2017-12-24 NOTE — Progress Notes (Signed)
Occupational Therapy Treatment Patient Details Name: Kelly [name] MRN: [id number] DOB: December 12, 1985 Today's Date: 12/24/2017    History of present illness Kelly [name] is a 32 y.o. F with sepsis, tachycardia, tachypnea, leukocytosis, hypotension.  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 PMH of polysubstance abuse, ADD, scoliosis sp back surgery (20 years ago per mom). PICC placed 12/08/17.   OT comments  Patient pleasant and cooperative.  Continues to require increased time and effort for all mobility and self care tasks, limited by pain in lower back.  She reports frustration pain, difficulty activating L ankle musculature, and bowel/bladder control/sensation.  Requires min assist for functional mobility into/out of restroom, walker management due to increased focus on control of L LE, requiring cueing for safety awareness. Initiated educated on AE, continues to require maximal assistance, but patient will benefit from further training to ensure understanding and promote increased independence.  Will continue to follow, plan for next session to focus on AE education and standing balance for ADLs.    Follow Up Recommendations  CIR;Supervision/Assistance - 24 hour    Equipment Recommendations  3 in 1 bedside commode;Wheelchair (measurements OT);Hospital bed;Wheelchair cushion (measurements OT)    Recommendations for Other Services Rehab consult    Precautions / Restrictions Precautions Precautions: Back;Fall Precaution Booklet Issued: No Precaution Comments: reviewed back precautions with patient Restrictions Weight Bearing Restrictions: No       Mobility Bed Mobility Overal bed mobility: Needs Assistance Bed Mobility: Rolling;Sidelying to Sit;Sit to Sidelying Rolling: Supervision Sidelying to sit: Supervision     Sit to sidelying: Min assist General bed mobility comments: required minA for  L LE into bed, cueing to ensure log rolling technique   Transfers Overall transfer level: Needs assistance Equipment used: Rolling walker (2 wheeled) Transfers: Sit to/from Stand Sit to Stand: Min guard         General transfer comment: min guard for safety and balance given increased time and effort, cueing for hand placement     Balance Overall balance assessment: Needs assistance Sitting-balance support: No upper extremity supported;Feet supported Sitting balance-Leahy Scale: Fair     Standing balance support: During functional activity;Single extremity supported Standing balance-Leahy Scale: Poor Standing balance comment: reliant on 1 UE support during toileting hygiene, preference for 2 UE support                           ADL either performed or assessed with clinical judgement   ADL Overall ADL's : Needs assistance/impaired                     Lower Body Dressing: Maximal assistance;Sit to/from stand;With adaptive equipment;Cueing for compensatory techniques;Cueing for back precautions Lower Body Dressing Details (indicate cue type and reason): initated education with AE, using sock aide, reacher and shoe horn; requires increased time and demonstrates increased difficutly managing L LE ; will benefit from practice with AE  Toilet Transfer: Minimal assistance;Ambulation;Regular Toilet;Grab bars;RW;Cueing for safety Toilet Transfer Details (indicate cue type and reason): min guard for safety and balance Toileting- Clothing Manipulation and Hygiene: Sitting/lateral lean;Sit to/from stand;Moderate assistance Toileting - Clothing Manipulation Details (indicate cue type and reason): lateral lean for hygiene, but requires assistance to manage clothing over hips due to fatigue and pain      Functional mobility during ADLs: Minimal assistance;Rolling walker General ADL Comments: Patient requires cueing and min assist for walker management,  noted increased time  and effort to manage and advance L LE during mobility.  Continues to report decrease sensation of need to void (bladder and bowels)     Vision       Perception     Praxis      Cognition Arousal/Alertness: Awake/alert Behavior During Therapy: WFL for tasks assessed/performed Overall Cognitive Status: Within Functional Limits for tasks assessed                                          Exercises     Shoulder Instructions       General Comments      Pertinent Vitals/ Pain       Pain Assessment: No/denies pain Pain Score: 7  Pain Location: back Pain Descriptors / Indicators: Discomfort;Guarding;Aching Pain Intervention(s): Limited activity within patient's tolerance;Monitored during session;Repositioned;Patient requesting pain meds-RN notified  Home Living                                          Prior Functioning/Environment              Frequency  Min 2X/week        Progress Toward Goals  OT Goals(current goals can now be found in the care plan section)  Progress towards OT goals: Progressing toward goals  Acute Rehab OT Goals Patient Stated Goal: To be able to walk independently OT Goal Formulation: With patient/family Time For Goal Achievement: 01/07/18 Potential to Achieve Goals: Good  Plan Discharge plan needs to be updated;Frequency remains appropriate    Co-evaluation                 AM-PAC PT "6 Clicks" Daily Activity     Outcome Measure   Help from another person eating meals?: None Help from another person taking care of personal grooming?: A Little Help from another person toileting, which includes using toliet, bedpan, or urinal?: A Lot Help from another person bathing (including washing, rinsing, drying)?: A Lot Help from another person to put on and taking off regular upper body clothing?: A Little Help from another person to put on and taking off regular lower body clothing?: A Lot 6 Click  Score: 16    End of Session Equipment Utilized During Treatment: Rolling walker;Gait belt  OT Visit Diagnosis: Unsteadiness on feet (R26.81);Other abnormalities of gait and mobility (R26.89);Pain Pain - Right/Left: Left Pain - part of body: (back)   Activity Tolerance Patient tolerated treatment well   Patient Left in bed;with call bell/phone within reach   Nurse Communication Mobility status;Other (comment);Patient requests pain meds(pt needs)        Time: 1445-1520 OT Time Calculation (min): 35 min  Charges: OT General Charges $OT Visit: 1 Visit OT Treatments $Self Care/Home Management : 23-37 mins  Delight Stare, OTR/L  Pager [place] 12/24/2017, 4:05 PM

## 2017-12-24 NOTE — Progress Notes (Signed)
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 Patient also complained of back pain. MRI of the lumbar spine suggested epidural abscess. Patient was transferred to [name] and underwent lumbar laminectomy for drainage of the epidural abscess.  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  Infectious disease was consulted.   Patient remains on Cefazolin.  RPR nonreactive. HIV nonreactive. Hepatitis C RNA by PCR negative.  Hepatitis Jaceion Aday and B neg.  Echocardiogram suggests severe tricuspid regurgitation.  Cardiology was consulted. They recommend completing treatment with IV antibiotics and repeat echocardiogram afterwards to determine if she will need further intervention. No plans for TEE currently. Per ID she will need 8 weeks of IV cefazolin through September 18. And may need oral treatment subsequently.    Lumbar epidural abscess She is status post lumbar laminectomy and drainage of abscess on 7/25.   Patient developed urinary retention and required Foley catheterization, but foley now removed.   She is experiencing some stool incontinence  as well.   Has had some LLE weakness since that time Does not appear to have any new neurological deficits.   Foley catheter was removed on 8/9.  Continue flomax Persistent low back pain, attempted MRI of L spine and pelvis, but unable to complete 2/2 pain.  Will try with higher dose of dilaudid.     Left back and flank pain UA with WBC's, negative nitrite, mod LE (in setting of recent foley placement).  Small RBC, 0-5.  Repeat imaging as noted above, follow   Hyperkalemia/Low Cortisol Potassium starting to rise again, follow closely  Received kayexalate 8/9.   Patient's sodium also noted to be low.   Cortisol level was checked which is noted to be low.  Patient underwent Quintell Bonnin ACTH stimulation test this morning.  Her cortisol level did increase from Sarya Linenberger basal value of 2.7 to Thresia Ramanathan 60-minute value of 15.5.  This is Kinberly Perris suboptimal response but not remarkably abnormal either.   Thyroid function tests are normal.   ACTH level is pending.  At this time there is no clear indication to initiate steroids.  Continue to monitor her electrolytes.  Await ACTH levels (19.3).  Will plan to discuss with endocrine.     History of scoliosis status post previous surgery/chronic back pain Patient mentioned that she takes methadone prescribed by an outpatient clinic. [facility] prescriber database was queried and did not show any prescriptions for methadone. [facility] in [place] was called by previous provider. Apparently patient was being given 80 mg of methadone at the clinic (no prescription) starting in April and last dose was on October 25, 2017. But patient has not returned to the clinic since that and  so she was discharged. And since no prescription was written, this information apparently does not show up on the [place] prescribers database.  Patient was started on methadone here in the hospital. Currently on 60 mg.  Her pain seems to be better controlled.  She did mention that the patient's mother has  contacted the clinic and they have agreed to take her back once she is discharged from the hospital.  So we will leave her on current dose of methadone.  Patient was told that we will be unable to prescribe this medication at discharge.  Mild acute renal failure with hyponatremia Renal function is normal.  She is noted to have mild hyponatremia which has been stable.  CTM.    Polysubstance abuse Will need ongoing counseling and outpatient rehab.  Patient to follow-up with the rehab clinic post discharge.  They will take up the responsibility of prescribing methadone.  Vaginal candidiasis Continue topical agents.  Urinary retention Per neurosurgery her bowel and bladder issues will eventually resolve but nothing to be done for it right now.   Foley catheter was discontinued on 8/9.  Is able to urinate on own.  Continue Flomax.  Bladder scans as needed.  Stable.  Normocytic anemia stable, follow   Anasarca Improved, follow  DVT prophylaxis: heparin Code Status: full  Family Communication: mother at bedside Disposition Plan: pending   Consultants:   ID  Neurosurgery  Cardiology  Procedures:  Study Conclusions  - Left ventricle: The cavity size was normal. Systolic function was   normal. The estimated ejection fraction was in the range of 55%   to 60%. Wall motion was normal; there were no regional wall   motion abnormalities. Left ventricular diastolic function   parameters were normal. - Aortic valve: Transvalvular velocity was within the normal range.   There was no stenosis. There was no regurgitation. - Mitral valve: Thickening of the anterior leaflet. Transvalvular   velocity was within the normal range. There was no evidence for   stenosis. There was trivial regurgitation. - Left atrium: The atrium was mildly dilated. - Right ventricle: The cavity size was normal. Wall thickness was   normal. Systolic function was normal. - Right atrium: The atrium was  severely dilated. - Tricuspid valve: There was Jyla Hopf medium-sized, 1.0 cm (W) x 1.7 cm   (L) vegetation on the septal leaflet. There was severe   regurgitation. - Pulmonary arteries: Systolic pressure was mildly increased. PA   peak pressure: 42 mm Hg (S).  Impressions:  - Vegetation noted on the septal leaflet of the tricuspid valve   consistent with endocarditis. Cannot exlude vegetation on the   anterior leaflet of the mitral valve. Suggest TEE to better   assess.  Antimicrobials:  Anti-infectives (From admission, onward)   Start     Dose/Rate Route Frequency Ordered Stop   12/06/17 2200  ceFAZolin (ANCEF) IVPB 2g/100 mL premix     2 g 200 mL/hr over 30 Minutes Intravenous Every 8 hours 12/06/17 1016 01/29/18 2359   12/06/17 0600  vancomycin (VANCOCIN) IVPB 1000 mg/200 mL premix  Status:  Discontinued     1,000 mg 200 mL/hr over 60 Minutes Intravenous Every 8 hours 12/05/17 2316 12/06/17 1200   12/06/17 0000  cefTRIAXone (ROCEPHIN) 2 g in sodium chloride 0.9 % 100 mL IVPB  Status:  Discontinued     2 g 200 mL/hr over 30 Minutes Intravenous Every 24 hours 12/05/17 2238 12/06/17 1016   12/05/17 2300  vancomycin (VANCOCIN) 1,750 mg  in sodium chloride 0.9 % 500 mL IVPB     1,750 mg 250 mL/hr over 120 Minutes Intravenous  Once 12/05/17 2246 12/06/17 0228   12/05/17 2055  vancomycin (VANCOCIN) powder  Status:  Discontinued       As needed 12/05/17 2055 12/05/17 2118   12/05/17 2001  bacitracin 50,000 Units in sodium chloride 0.9 % 500 mL irrigation  Status:  Discontinued       As needed 12/05/17 2002 12/05/17 2118   12/03/17 1600  azithromycin (ZITHROMAX) 500 mg in sodium chloride 0.9 % 250 mL IVPB  Status:  Discontinued     500 mg 250 mL/hr over 60 Minutes Intravenous Every 24 hours 12/02/17 1706 12/03/17 0948   12/03/17 1400  ceFAZolin (ANCEF) IVPB 2g/100 mL premix  Status:  Discontinued     2 g 200 mL/hr over 30 Minutes Intravenous Every 8 hours 12/03/17 0948 12/05/17 2238    12/03/17 1000  vancomycin (VANCOCIN) IVPB 750 mg/150 ml premix  Status:  Discontinued     750 mg 150 mL/hr over 60 Minutes Intravenous Every 12 hours 12/02/17 1710 12/03/17 0948   12/02/17 2200  piperacillin-tazobactam (ZOSYN) IVPB 3.375 g  Status:  Discontinued     3.375 g 12.5 mL/hr over 240 Minutes Intravenous Every 8 hours 12/02/17 1710 12/03/17 0948   12/02/17 1800  vancomycin (VANCOCIN) 1,500 mg in sodium chloride 0.9 % 500 mL IVPB     1,500 mg 250 mL/hr over 120 Minutes Intravenous  Once 12/02/17 1710 12/02/17 2110   12/02/17 1530  cefTRIAXone (ROCEPHIN) 1 g in sodium chloride 0.9 % 100 mL IVPB     1 g 200 mL/hr over 30 Minutes Intravenous  Once 12/02/17 1522 12/02/17 1626   12/02/17 1530  azithromycin (ZITHROMAX) 500 mg in sodium chloride 0.9 % 250 mL IVPB     500 mg 250 mL/hr over 60 Minutes Intravenous  Once 12/02/17 1522 12/02/17 1759     Subjective: Persistent L sided lower back pain. Wasn't able to tolerate MRI.   Objective: Vitals:   12/24/17 0532 12/24/17 0642 12/24/17 1115 12/24/17 1555  BP: (!) 120/91  (!) 118/98 (!) 96/57  Pulse: 84  86 97  Resp: 17  16 16   Temp: 98.3 F (36.8 C)  97.9 F (36.6 C) 98.5 F (36.9 C)  TempSrc: Oral  Oral Oral  SpO2: 100%  98% 95%  Weight:  78.7 kg    Height:        Intake/Output Summary (Last 24 hours) at 12/24/2017 1700 Last data filed at 12/24/2017 1950 Gross per 24 hour  Intake 1568.29 ml  Output -  Net 1568.29 ml   Filed Weights   12/22/17 0500 12/23/17 0543 12/24/17 0642  Weight: 84.5 kg 78.5 kg 78.7 kg    Examination:  General: No acute distress. Cardiovascular: Heart sounds show Jeanpierre Thebeau regular rate, and rhythm.  Lungs: Clear to auscultation bilaterally with good air movement.  Abdomen: Soft, nontender, nondistended  MSK: ttp around L SI joint Neurological: Alert and oriented 3. 4/5 strength to LLE Skin: Warm and dry. No rashes or lesions. Extremities: No clubbing or cyanosis. No edema. Pedal pulses  2+. Psychiatric: Mood and affect are normal. Insight and judgment are appropriat.   Data Reviewed: I have personally reviewed following labs and imaging studies  CBC: Recent Labs  Lab 12/18/17 0529 12/23/17 0358 12/24/17 0520  WBC 9.2 8.5 8.2  NEUTROABS 6.5  --   --   HGB 8.0* 8.2* 8.5*  HCT 25.9* 26.8*  26.1*  MCV 88.1 88.7 87.9  PLT 689* 622* 817*   Basic Metabolic Panel: Recent Labs  Lab 12/20/17 0344 12/21/17 0343 12/22/17 0417 12/23/17 0358 12/24/17 0520  NA 134* 134* 132* 134* 134*  K 5.0 4.4 4.7 4.5 5.2*  CL 93* 93* 92* 95* 94*  CO2 29 28 29 30 29   GLUCOSE 105* 118* 98 110* 103*  BUN 23* 23* 21* 24* 20  CREATININE 0.78 0.79 0.68 0.85 0.73  CALCIUM 9.9 9.8 9.8 9.7 9.8  MG  --   --   --   --  1.9   GFR: Estimated Creatinine Clearance: 108 mL/min (by C-G formula based on SCr of 0.73 mg/dL). Liver Function Tests: Recent Labs  Lab 12/19/17 0507  AST 16  ALT 5  ALKPHOS 224*  BILITOT 0.6  PROT 8.4*  ALBUMIN 2.4*   No results for input(s): LIPASE, AMYLASE in the last 168 hours. No results for input(s): AMMONIA in the last 168 hours. Coagulation Profile: No results for input(s): INR, PROTIME in the last 168 hours. Cardiac Enzymes: No results for input(s): CKTOTAL, CKMB, CKMBINDEX, TROPONINI in the last 168 hours. BNP (last 3 results) No results for input(s): PROBNP in the last 8760 hours. HbA1C: No results for input(s): HGBA1C in the last 72 hours. CBG: No results for input(s): GLUCAP in the last 168 hours. Lipid Profile: No results for input(s): CHOL, HDL, LDLCALC, TRIG, CHOLHDL, LDLDIRECT in the last 72 hours. Thyroid Function Tests: Recent Labs    12/22/17 0417  TSH 4.219  FREET4 0.87   Anemia Panel: No results for input(s): VITAMINB12, FOLATE, FERRITIN, TIBC, IRON, RETICCTPCT in the last 72 hours. Sepsis Labs: No results for input(s): PROCALCITON, LATICACIDVEN in the last 168 hours.  No results found for this or any previous visit (from the  past 240 hour(s)).       Radiology Studies: No results found.      Scheduled Meds: . acetaminophen  650 mg Oral Q6H  . bupivacaine liposome  20 mL Infiltration Once  . bupivacaine-EPINEPHrine  50 mL Infiltration Once  . Chlorhexidine Gluconate Cloth  6 each Topical Q2200  . cyclobenzaprine  10 mg Oral TID  . diclofenac sodium  2 g Topical QID  . docusate sodium  100 mg Oral BID  . fluticasone  2 spray Each Nare Daily  . guaiFENesin  1,200 mg Oral BID  . heparin  5,000 Units Subcutaneous Q8H  . lactobacillus  1 g Oral TID WC  . lidocaine  1 patch Transdermal Q24H  . magnesium oxide  400 mg Oral BID  . methadone  60 mg Oral Daily  . nicotine  21 mg Transdermal Daily  . nystatin cream   Topical BID  . pantoprazole  40 mg Oral Daily  . senna-docusate  1 tablet Oral BID  . sodium chloride flush  10-40 mL Intracatheter Q12H  . tamsulosin  0.4 mg Oral Daily   Continuous Infusions: .  ceFAZolin (ANCEF) IV 2 g (12/24/17 1406)  . lactated ringers 10 mL/hr at 12/20/17 0600  . methocarbamol (ROBAXIN) IV 500 mg (12/24/17 0003)     LOS: 22 days    Time spent: over 30 min MDM moderate with MSSA bacteremia with tricuspid valve endocarditis with septic pulm emboli with lumbar epidural abscess s/p drainage with persistent low back pain and plan to reimage.    Fayrene Helper, MD [facility] Pager [phone]  If 7PM-7AM, please contact night-coverage [email or web address] Password [email or web address] 12/24/2017, 5:00 PM

## 2017-12-25 ENCOUNTER — Inpatient Hospital Stay (HOSPITAL_COMMUNITY): Payer: Medicaid Other

## 2017-12-25 LAB — CBC
HCT: 28 % — ABNORMAL LOW (ref 36.0–46.0)
Hemoglobin: 8.5 g/dL — ABNORMAL LOW (ref 12.0–15.0)
MCH: 27.2 pg (ref 26.0–34.0)
MCHC: 30.4 g/dL (ref 30.0–36.0)
MCV: 89.7 fL (ref 78.0–100.0)
PLATELETS: 626 10*3/uL — AB (ref 150–400)
RBC: 3.12 MIL/uL — ABNORMAL LOW (ref 3.87–5.11)
RDW: 15.2 % (ref 11.5–15.5)
WBC: 8 10*3/uL (ref 4.0–10.5)

## 2017-12-25 LAB — BASIC METABOLIC PANEL
Anion gap: 11 (ref 5–15)
BUN: 18 mg/dL (ref 6–20)
CALCIUM: 9.9 mg/dL (ref 8.9–10.3)
CHLORIDE: 96 mmol/L — AB (ref 98–111)
CO2: 26 mmol/L (ref 22–32)
CREATININE: 0.66 mg/dL (ref 0.44–1.00)
GFR calc Af Amer: >60 mL/min (ref >60)
GFR calc non Af Amer: >60 mL/min (ref >60)
Glucose, Bld: 127 mg/dL — ABNORMAL HIGH (ref 70–99)
Potassium: 4.5 mmol/L (ref 3.5–5.1)
SODIUM: 133 mmol/L — AB (ref 135–145)

## 2017-12-25 LAB — MAGNESIUM: Magnesium: 1.9 mg/dL (ref 1.7–2.4)

## 2017-12-25 MED ORDER — GADOBENATE DIMEGLUMINE 529 MG/ML IV SOLN
15.0000 mL | Freq: Once | INTRAVENOUS | Status: AC | PRN
  Administered 2017-12-25: 15 mL via INTRAVENOUS

## 2017-12-25 MED ORDER — GADOBENATE DIMEGLUMINE 529 MG/ML IV SOLN
7.0000 mL | Freq: Once | INTRAVENOUS | Status: AC
  Administered 2017-12-25: 7 mL via INTRAVENOUS

## 2017-12-25 MED ORDER — GADOBENATE DIMEGLUMINE 529 MG/ML IV SOLN
15.0000 mL | Freq: Once | INTRAVENOUS | Status: AC | PRN
Start: 2017-12-25 — End: 2017-12-25
  Administered 2017-12-25: 15 mL via INTRAVENOUS

## 2017-12-25 NOTE — Progress Notes (Signed)
Physical Therapy Treatment Patient Details Name: Kelly [name] MRN: [id number] DOB: [birth year] Today's Date: 12/25/2017    History of Present Illness Kelly [name] is a 32 y.o. F with sepsis, tachycardia, tachypnea, leukocytosis, hypotension.  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 PMH of polysubstance abuse, ADD, scoliosis sp back surgery (20 years ago per mom). PICC placed 12/08/17.    PT Comments    Patient is progressing very well towards their physical therapy goals. Remains very motivated and eager to participate. Patient voicing some concern over lack of eversion. Patient currently presenting with 2/5 left ankle dorsiflexion and 0/5 ankle eversion. Encouraged continued ankle AROM and discussed use of bracing in future if muscle activation does not return to assist gait training. Ambulating 90 feet total with walker and required a seated rest break (chair follow utilized). Patient able to self cue to achieve foot neutral during ambulation. Continue to recommend comprehensive inpatient rehab (CIR) for post-acute therapy needs.    Follow Up Recommendations  CIR     Equipment Recommendations  Rolling walker with 5" wheels;3in1 (PT)    Recommendations for Other Services Rehab consult     Precautions / Restrictions Precautions Precautions: Back;Fall Precaution Booklet Issued: No Precaution Comments: verbal reminder for log roll technique, otherwise, good maintenance of precautions throughout session Restrictions Weight Bearing Restrictions: No    Mobility  Bed Mobility Overal bed mobility: Needs Assistance Bed Mobility: Rolling;Sidelying to Sit Rolling: Modified independent (Device/Increase time) Sidelying to sit: Supervision       General bed mobility comments: Patient requiring increased time and effort and verbal reminder for technique. HOB elevated  Transfers Overall transfer  level: Needs assistance Equipment used: Rolling walker (2 wheeled) Transfers: Sit to/from Stand Sit to Stand: Min guard;Min assist         General transfer comment: Patient requiring mostly min guard assist for transitioning to standing from edge of bed and recliner, however, required min assist from lower surface of toilet. cues for quad activation for knee extension to achieve upright positioning  Ambulation/Gait Ambulation/Gait assistance: Min guard;+2 safety/equipment(Chair follow) Gait Distance (Feet): 90 Feet, 45 feet x 2 with seated rest break Assistive device: Rolling walker (2 wheeled) Gait Pattern/deviations: Step-through pattern;Narrow base of support;Decreased step length - right;Decreased dorsiflexion - left Gait velocity: Decreased   General Gait Details: Patient able to self cue to keep left foot neutral to prevent inversion. Verbal cues provided for hip extension, increased right step length and decreased left step length and activity pacing.   Stairs             Wheelchair Mobility    Modified Rankin (Stroke Patients Only)       Balance Overall balance assessment: Needs assistance Sitting-balance support: No upper extremity supported;Feet supported Sitting balance-Leahy Scale: Good     Standing balance support: During functional activity;No upper extremity supported Standing balance-Leahy Scale: Fair Standing balance comment: able to maintain static balance to pull underwear up with no BUE support and min guard assist. Performed hand washing at sink with single UE support and increased knee flexion.                            Cognition Arousal/Alertness: Awake/alert Behavior During Therapy: WFL for tasks assessed/performed Overall Cognitive Status: Within Functional Limits for tasks assessed  Exercises Other Exercises Other Exercises: Instructed patient on mental imagery exercises  for ankle eversion and encouraged continued ankle AROM     General Comments General comments (skin integrity, edema, etc.): Total assistance for donning socks and shoes to maintain spinal precautions.      Pertinent Vitals/Pain Pain Assessment: Faces Faces Pain Scale: Hurts even more Pain Location: "back spasm" Pain Descriptors / Indicators: Grimacing;Guarding;Aching Pain Intervention(s): Monitored during session    Home Living                      Prior Function            PT Goals (current goals can now be found in the care plan section) Acute Rehab PT Goals Patient Stated Goal: "work on left foot turning out." PT Goal Formulation: With patient Time For Goal Achievement: 01/03/18 Potential to Achieve Goals: Good Progress towards PT goals: Progressing toward goals    Frequency    Min 3X/week      PT Plan Current plan remains appropriate    Co-evaluation              AM-PAC PT "6 Clicks" Daily Activity  Outcome Measure  Difficulty turning over in bed (including adjusting bedclothes, sheets and blankets)?: A Little Difficulty moving from lying on back to sitting on the side of the bed? : A Little Difficulty sitting down on and standing up from a chair with arms (e.g., wheelchair, bedside commode, etc,.)?: A Lot Help needed moving to and from a bed to chair (including a wheelchair)?: A Little Help needed walking in hospital room?: A Little Help needed climbing 3-5 steps with a railing? : A Lot 6 Click Score: 16    End of Session Equipment Utilized During Treatment: Gait belt Activity Tolerance: Patient tolerated treatment well Patient left: with call bell/phone within reach;in chair   PT Visit Diagnosis: Unsteadiness on feet (R26.81);Other abnormalities of gait and mobility (R26.89);Muscle weakness (generalized) (M62.81);Difficulty in walking, not elsewhere classified (R26.2);Pain;Other symptoms and signs involving the nervous system (R29.898) Pain  - Right/Left: (back) Pain - part of body: (Back)     Time: 0511-0211 PT Time Calculation (min) (ACUTE ONLY): 40 min  Charges:  $Gait Training: 23-37 mins $Therapeutic Activity: 8-22 mins                    Ellamae Sia, PT, DPT Acute Rehabilitation Services  Pager: [phone]   Willy Eddy 12/25/2017, 3:51 PM

## 2017-12-25 NOTE — Progress Notes (Signed)
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 Patient also complained of back pain. MRI of the lumbar spine suggested epidural abscess. Patient was transferred to [facility] and underwent lumbar laminectomy for drainage of the epidural abscess.  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  Infectious disease was consulted.   Patient remains on Cefazolin.  RPR nonreactive. HIV nonreactive. Hepatitis C RNA by PCR negative.  Hepatitis Kelly [name] and B neg.  Echocardiogram suggests severe tricuspid regurgitation.  Cardiology was consulted. They recommend completing treatment with IV antibiotics and repeat echocardiogram afterwards to determine if she will need further intervention. No plans for TEE currently. Per ID she will need 8 weeks of IV cefazolin through September 18. And may need oral treatment subsequently.    Lumbar epidural abscess She is status post lumbar laminectomy and drainage of abscess on 7/25.   Patient developed urinary retention and required Foley catheterization, but foley now removed.   She is experiencing some  stool incontinence as well.   Has had some LLE weakness since that time Does not appear to have any new neurological deficits.   Foley catheter was removed on 8/9.  Continue flomax MRI L spine/pelvis done on 8/14 notable for persistent discitis and possible epidural soft tissue thickening concerning for phlegmon.  No drainable abscess.  Discussed findings with neurosurgery and no findings that need intervention.  Left back and flank pain UA with WBC's, negative nitrite, mod LE (in setting of recent foley placement).  Small RBC, 0-5.  Repeat imaging as noted above, follow   Hyperkalemia/Low Cortisol K fluctuating, follow.  Will try to discuss with endo, but appears table for now. Received kayexalate 8/9.   Patient's sodium also noted to be low.   Cortisol level was checked which is noted to be low.  Patient underwent Winter Trefz ACTH stimulation test this morning.  Her cortisol level did increase from Kelly [name] basal value of 2.7 to Kelly [name] 60-minute value of 15.5.  This is Kelly [name] suboptimal response but not remarkably abnormal either.   Thyroid function tests are normal.   ACTH level is pending.  At this time there is no clear indication to initiate steroids.  Continue to monitor her electrolytes.  Await ACTH levels (19.3).  Will plan to discuss with endocrine.     History of scoliosis status post previous surgery/chronic back pain Patient mentioned that she takes methadone prescribed by an outpatient clinic. [facility] prescriber database was queried and did not show any prescriptions for methadone. [facility] in [place] was called by previous provider. Apparently patient was being given 80 mg of methadone at the clinic (no prescription) starting in April and last dose was  on October 25, 2017. But patient has not returned to the clinic since that and so she was discharged. And since no prescription was written, this information apparently does not show up on the [place] prescribers database.  Patient was  started on methadone here in the hospital. Currently on 60 mg.  Her pain seems to be better controlled.  She did mention that the patient's mother has contacted the clinic and they have agreed to take her back once she is discharged from the hospital.  So we will leave her on current dose of methadone.  Patient was told that we will be unable to prescribe this medication at discharge.  Mild acute renal failure with hyponatremia Renal function is normal.  She is noted to have mild hyponatremia which has been stable.  Follow.    Polysubstance abuse Will need ongoing counseling and outpatient rehab.  Patient to follow-up with the rehab clinic post discharge.  They will take up the responsibility of prescribing methadone.  Vaginal candidiasis Continue topical agents.  Urinary retention  Bowel incontinence Per neurosurgery her bowel and bladder issues will eventually resolve but nothing to be done for it right now. Improving slowly.  Still with some incontinence of stool.   Foley catheter was discontinued on 8/9.  Is able to urinate on own.  Continue Flomax.  Bladder scans as needed.  Stable.  Normocytic anemia stable, follow   Anasarca Improved, stable.  DVT prophylaxis: heparin Code Status: full  Family Communication: none at bedside Disposition Plan: pending   Consultants:   ID  Neurosurgery  Cardiology  Procedures:  Study Conclusions  - Left ventricle: The cavity size was normal. Systolic function was   normal. The estimated ejection fraction was in the range of 55%   to 60%. Wall motion was normal; there were no regional wall   motion abnormalities. Left ventricular diastolic function   parameters were normal. - Aortic valve: Transvalvular velocity was within the normal range.   There was no stenosis. There was no regurgitation. - Mitral valve: Thickening of the anterior leaflet. Transvalvular   velocity was within the normal range. There was no evidence for    stenosis. There was trivial regurgitation. - Left atrium: The atrium was mildly dilated. - Right ventricle: The cavity size was normal. Wall thickness was   normal. Systolic function was normal. - Right atrium: The atrium was severely dilated. - Tricuspid valve: There was Joran Kallal medium-sized, 1.0 cm (W) x 1.7 cm   (L) vegetation on the septal leaflet. There was severe   regurgitation. - Pulmonary arteries: Systolic pressure was mildly increased. PA   peak pressure: 42 mm Hg (S).  Impressions:  - Vegetation noted on the septal leaflet of the tricuspid valve   consistent with endocarditis. Cannot exlude vegetation on the   anterior leaflet of the mitral valve. Suggest TEE to better   assess.  Antimicrobials:  Anti-infectives (From admission, onward)   Start     Dose/Rate Route Frequency Ordered Stop   12/06/17 2200  ceFAZolin (ANCEF) IVPB 2g/100 mL premix     2 g 200 mL/hr over 30 Minutes Intravenous Every 8 hours 12/06/17 1016 01/29/18 2359   12/06/17 0600  vancomycin (VANCOCIN) IVPB 1000 mg/200 mL premix  Status:  Discontinued     1,000 mg 200 mL/hr over 60 Minutes Intravenous Every 8 hours 12/05/17 2316 12/06/17 1200   12/06/17 0000  cefTRIAXone (ROCEPHIN) 2 g in sodium chloride 0.9 % 100 mL IVPB  Status:  Discontinued  2 g 200 mL/hr over 30 Minutes Intravenous Every 24 hours 12/05/17 2238 12/06/17 1016   12/05/17 2300  vancomycin (VANCOCIN) 1,750 mg in sodium chloride 0.9 % 500 mL IVPB     1,750 mg 250 mL/hr over 120 Minutes Intravenous  Once 12/05/17 2246 12/06/17 0228   12/05/17 2055  vancomycin (VANCOCIN) powder  Status:  Discontinued       As needed 12/05/17 2055 12/05/17 2118   12/05/17 2001  bacitracin 50,000 Units in sodium chloride 0.9 % 500 mL irrigation  Status:  Discontinued       As needed 12/05/17 2002 12/05/17 2118   12/03/17 1600  azithromycin (ZITHROMAX) 500 mg in sodium chloride 0.9 % 250 mL IVPB  Status:  Discontinued     500 mg 250 mL/hr over 60 Minutes  Intravenous Every 24 hours 12/02/17 1706 12/03/17 0948   12/03/17 1400  ceFAZolin (ANCEF) IVPB 2g/100 mL premix  Status:  Discontinued     2 g 200 mL/hr over 30 Minutes Intravenous Every 8 hours 12/03/17 0948 12/05/17 2238   12/03/17 1000  vancomycin (VANCOCIN) IVPB 750 mg/150 ml premix  Status:  Discontinued     750 mg 150 mL/hr over 60 Minutes Intravenous Every 12 hours 12/02/17 1710 12/03/17 0948   12/02/17 2200  piperacillin-tazobactam (ZOSYN) IVPB 3.375 g  Status:  Discontinued     3.375 g 12.5 mL/hr over 240 Minutes Intravenous Every 8 hours 12/02/17 1710 12/03/17 0948   12/02/17 1800  vancomycin (VANCOCIN) 1,500 mg in sodium chloride 0.9 % 500 mL IVPB     1,500 mg 250 mL/hr over 120 Minutes Intravenous  Once 12/02/17 1710 12/02/17 2110   12/02/17 1530  cefTRIAXone (ROCEPHIN) 1 g in sodium chloride 0.9 % 100 mL IVPB     1 g 200 mL/hr over 30 Minutes Intravenous  Once 12/02/17 1522 12/02/17 1626   12/02/17 1530  azithromycin (ZITHROMAX) 500 mg in sodium chloride 0.9 % 250 mL IVPB     500 mg 250 mL/hr over 60 Minutes Intravenous  Once 12/02/17 1522 12/02/17 1759     Subjective: C/o LBP. Just back from MRI.  Objective: Vitals:   12/24/17 1115 12/24/17 1555 12/25/17 0525 12/25/17 1332  BP: (!) 118/98 (!) 96/57 91/62 96/61   Pulse: 86 97 87 82  Resp: 16 16 16 17   Temp: 97.9 F (36.6 C) 98.5 F (36.9 C) 97.6 F (36.4 C) 97.6 F (36.4 C)  TempSrc: Oral Oral Oral Oral  SpO2: 98% 95% 98% 98%  Weight:   78 kg   Height:        Intake/Output Summary (Last 24 hours) at 12/25/2017 1823 Last data filed at 12/25/2017 1700 Gross per 24 hour  Intake 1596 ml  Output -  Net 1596 ml   Filed Weights   12/23/17 0543 12/24/17 0642 12/25/17 0525  Weight: 78.5 kg 78.7 kg 78 kg    Examination:  General: No acute distress. Cardiovascular: Heart sounds show Ople Girgis regular rate, and rhythm.  Lungs: Clear to auscultation bilaterally with good air movement.  Abdomen: Soft, nontender,  nondistended  Neurological: Alert and oriented 3. Cranial nerves II through XII grossly intact. Skin: Warm and dry. No rashes or lesions. Extremities: No clubbing or cyanosis. No edema.  Psychiatric: Mood and affect are normal. Insight and judgment are appropriate.  Data Reviewed: I have personally reviewed following labs and imaging studies  CBC: Recent Labs  Lab 12/23/17 0358 12/24/17 0520 12/25/17 0410  WBC 8.5 8.2 8.0  HGB 8.2* 8.5*  8.5*  HCT 26.8* 26.1* 28.0*  MCV 88.7 87.9 89.7  PLT 622* 578* 196*   Basic Metabolic Panel: Recent Labs  Lab 12/21/17 0343 12/22/17 0417 12/23/17 0358 12/24/17 0520 12/25/17 0410  NA 134* 132* 134* 134* 133*  K 4.4 4.7 4.5 5.2* 4.5  CL 93* 92* 95* 94* 96*  CO2 28 29 30 29 26   GLUCOSE 118* 98 110* 103* 127*  BUN 23* 21* 24* 20 18  CREATININE 0.79 0.68 0.85 0.73 0.66  CALCIUM 9.8 9.8 9.7 9.8 9.9  MG  --   --   --  1.9 1.9   GFR: Estimated Creatinine Clearance: 107.7 mL/min (by C-G formula based on SCr of 0.66 mg/dL). Liver Function Tests: Recent Labs  Lab 12/19/17 0507  AST 16  ALT 5  ALKPHOS 224*  BILITOT 0.6  PROT 8.4*  ALBUMIN 2.4*   No results for input(s): LIPASE, AMYLASE in the last 168 hours. No results for input(s): AMMONIA in the last 168 hours. Coagulation Profile: No results for input(s): INR, PROTIME in the last 168 hours. Cardiac Enzymes: No results for input(s): CKTOTAL, CKMB, CKMBINDEX, TROPONINI in the last 168 hours. BNP (last 3 results) No results for input(s): PROBNP in the last 8760 hours. HbA1C: No results for input(s): HGBA1C in the last 72 hours. CBG: No results for input(s): GLUCAP in the last 168 hours. Lipid Profile: No results for input(s): CHOL, HDL, LDLCALC, TRIG, CHOLHDL, LDLDIRECT in the last 72 hours. Thyroid Function Tests: No results for input(s): TSH, T4TOTAL, FREET4, T3FREE, THYROIDAB in the last 72 hours. Anemia Panel: No results for input(s): VITAMINB12, FOLATE, FERRITIN, TIBC,  IRON, RETICCTPCT in the last 72 hours. Sepsis Labs: No results for input(s): PROCALCITON, LATICACIDVEN in the last 168 hours.  No results found for this or any previous visit (from the past 240 hour(s)).       Radiology Studies: Mr Lumbar Spine W Wo Contrast  Result Date: 12/25/2017 CLINICAL DATA:  Previous epidural infection. Persistent pain in the back and pelvis. EXAM: MRI LUMBAR SPINE WITHOUT AND WITH CONTRAST TECHNIQUE: Multiplanar and multiecho pulse sequences of the lumbar spine were obtained without and with intravenous contrast. CONTRAST:  33m MULTIHANCE GADOBENATE DIMEGLUMINE 529 MG/ML IV SOLN, 720mMULTIHANCE GADOBENATE DIMEGLUMINE 529 MG/ML IV SOLN COMPARISON:  12/05/2017 FINDINGS: Segmentation: Same numbering terminology as utilized previously. Five lumbar type vertebral bodies. Alignment:  Unremarkable. Vertebrae: Previous spinal fusion extending as low as L3. Since the previous study, there is been laminectomy at L4 and L5. Conus medullaris and cauda equina: Not visible because of artifact. Paraspinal and other soft tissues: No evidence of drainable fluid collection. Disc levels: No evidence of residual epidural abscess in the lower lumbar region. There is nonspecific enhancing soft tissue within the spinal canal and posterior to the spinal canal along the surgical approach, but no evidence of liquid abscess. IMPRESSION: Status post L4 and L5 laminectomy for evacuation of lower lumbar epidural abscess. Better/expected appearance presently with soft tissue enhancement but no evidence of nonenhancing fluid. Limited information available at L3 and above because of artifact from fusion hardware. Electronically Signed   By: MaNelson Chimes.D.   On: 12/25/2017 15:54   Mr Pelvis W Wo Contrast  Result Date: 12/25/2017 CLINICAL DATA:  Osteomyelitis of the pelvis. EXAM: MRI PELVIS WITHOUT AND WITH CONTRAST TECHNIQUE: Multiplanar multisequence MR imaging of the pelvis was performed both before  and after administration of intravenous contrast. CONTRAST:  1543mULTIHANCE GADOBENATE DIMEGLUMINE 529 MG/ML IV SOLN COMPARISON:  None.  FINDINGS: Bones: No hip fracture, dislocation or avascular necrosis. No periosteal reaction or bone destruction. No aggressive osseous lesion. Normal sacrum and sacroiliac joints. No SI joint widening or erosive changes. Prior L5-S1 laminectomy. Postsurgical changes in the posterior paraspinal soft tissues at L5-S1. Right paracentral discectomy. Broad-based disc bulge eccentric towards the left. Mild left foraminal stenosis. No right foraminal stenosis. Fluid signal in L5-S1 disc space with mild marrow edema on the left side of the disc space concerning for persistent discitis. Mild epidural soft tissue thickening concerning for Preeti Winegardner phlegmon. No drainable epidural abscess. Articular cartilage and labrum Articular cartilage:  No chondral defect. Labrum: Grossly intact, but evaluation is limited by lack of intraarticular fluid. Joint or bursal effusion Joint effusion:  No hip joint effusion.  No SI joint effusion. Bursae:  No bursa formation. Muscles and tendons Flexors: Normal. Extensors: Normal. Abductors: Normal. Adductors: Normal. Gluteals: Normal. Hamstrings: Normal. Paraspinal: Mild soft tissue edema in the paraspinal muscles at the laminectomy site at L5-S1 likely postsurgical. Other findings Miscellaneous: No pelvic free fluid. No fluid collection or hematoma. No inguinal lymphadenopathy. No inguinal hernia. Small focal areas of T2 hyperintense signal within the subcutaneous fat of the anterior abdominal wall consistent with injection sites. IMPRESSION: 1. Fluid signal in L5-S1 disc space with mild marrow edema on the left side of the disc space concerning for persistent discitis. Mild epidural soft tissue thickening concerning for Hartleigh Edmonston phlegmon. No drainable epidural abscess. Postsurgical changes in the posterior paraspinal soft tissues at L5-S1. Right paracentral discectomy.  Broad-based disc bulge eccentric towards the left. Mild left foraminal stenosis. No right foraminal stenosis. Electronically Signed   By: Kathreen Devoid   On: 12/25/2017 13:31        Scheduled Meds: . acetaminophen  1,000 mg Oral Q8H  . bupivacaine liposome  20 mL Infiltration Once  . bupivacaine-EPINEPHrine  50 mL Infiltration Once  . Chlorhexidine Gluconate Cloth  6 each Topical Q2200  . cyclobenzaprine  10 mg Oral TID  . diclofenac sodium  2 g Topical QID  . docusate sodium  100 mg Oral BID  . fluticasone  2 spray Each Nare Daily  . guaiFENesin  1,200 mg Oral BID  . heparin  5,000 Units Subcutaneous Q8H  . lactobacillus  1 g Oral TID WC  . lidocaine  1 patch Transdermal Q24H  . magnesium oxide  400 mg Oral BID  . methadone  60 mg Oral Daily  . nicotine  21 mg Transdermal Daily  . nystatin cream   Topical BID  . pantoprazole  40 mg Oral Daily  . senna-docusate  1 tablet Oral BID  . sodium chloride flush  10-40 mL Intracatheter Q12H  . tamsulosin  0.4 mg Oral Daily   Continuous Infusions: .  ceFAZolin (ANCEF) IV 2 g (12/25/17 1419)  . lactated ringers 10 mL/hr at 12/20/17 0600  . methocarbamol (ROBAXIN) IV 500 mg (12/24/17 0003)     LOS: 23 days    Time spent: over 32 min   Fayrene Helper, MD [facility] Pager [phone]  If 7PM-7AM, please contact night-coverage [email or web address] Password [email or web address] 12/25/2017, 6:23 PM

## 2017-12-26 NOTE — Plan of Care (Signed)
  Problem: Pain Managment: Goal: General experience of comfort will improve Outcome: Progressing   Problem: Skin Integrity: Goal: Risk for impaired skin integrity will decrease Outcome: Progressing   

## 2017-12-26 NOTE — Progress Notes (Signed)
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 Patient also complained of back pain. MRI of the lumbar spine suggested epidural abscess. Patient was transferred to [name] and underwent lumbar laminectomy for drainage of the epidural abscess.  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  Infectious disease was consulted.   Patient remains on Cefazolin.  RPR nonreactive. HIV nonreactive. Hepatitis C RNA by PCR negative.  Hepatitis A and B neg.  Echocardiogram suggests severe tricuspid regurgitation.  Cardiology was consulted. They recommend completing treatment with IV antibiotics and repeat echocardiogram afterwards to determine if she will need further intervention. No plans for TEE currently. Per ID she will need 8 weeks of IV cefazolin through September 18. And may need oral treatment subsequently.    Lumbar epidural abscess She is status post lumbar laminectomy and drainage of abscess on 7/25.   Patient developed urinary retention and required Foley catheterization, but foley now removed.   She is experiencing some  stool incontinence as well.   Has had some LLE weakness since that time Does not appear to have any new neurological deficits.   Foley catheter was removed on 8/9.  Continue flomax MRI L spine/pelvis done on 8/14 notable for persistent discitis and possible epidural soft tissue thickening concerning for phlegmon.  No drainable abscess.  Discussed findings with neurosurgery and no findings that need intervention.  Left back and flank pain UA with WBC's, negative nitrite, mod LE (in setting of recent foley placement).  Small RBC, 0-5.  Repeat imaging as noted above, follow  Stable  Hyperkalemia/Low Cortisol K fluctuating, follow.  Will try to discuss with endo, but appears table for now. Received kayexalate 8/9.   Patient's sodium also noted to be low.   Cortisol level was checked which is noted to be low.  Patient underwent a ACTH stimulation test this morning.  Her cortisol level did increase from a basal value of 2.7 to a 60-minute value of 15.5.  This is a suboptimal response but not remarkably abnormal either.   Thyroid function tests are normal.   ACTH level is pending.  At this time there is no clear indication to initiate steroids.  Continue to monitor her electrolytes.  Await ACTH levels (19.3).  Will plan to discuss with endocrine.     History of scoliosis status post previous surgery/chronic back pain Patient mentioned that she takes methadone prescribed by an outpatient clinic. [facility] prescriber database was queried and did not show any prescriptions for methadone. [facility] in [place] was called by previous provider. Apparently patient was being given 80 mg of methadone at the clinic (no prescription) starting in April and last dose  was on October 25, 2017. But patient has not returned to the clinic since that and so she was discharged. And since no prescription was written, this information apparently does not show up on the [place] prescribers database.  Patient  was started on methadone here in the hospital. Currently on 60 mg.  Her pain seems to be better controlled.  She did mention that the patient's mother has contacted the clinic and they have agreed to take her back once she is discharged from the hospital.  So we will leave her on current dose of methadone.  Patient was told that we will be unable to prescribe this medication at discharge.  Mild acute renal failure with hyponatremia Renal function is normal.  She is noted to have mild hyponatremia which has been stable.  Follow.    Polysubstance abuse Will need ongoing counseling and outpatient rehab.  Patient to follow-up with the rehab clinic post discharge.  They will take up the responsibility of prescribing methadone.  Vaginal candidiasis Continue topical agents.  Urinary retention  Bowel incontinence Per neurosurgery her bowel and bladder issues will eventually resolve but nothing to be done for it right now. Improving slowly.  Still with some incontinence of stool.   Foley catheter was discontinued on 8/9.  Is able to urinate on own.  Continue Flomax.  Bladder scans as needed.  Stable.  Normocytic anemia stable, follow   Anasarca Improved, stable.  DVT prophylaxis: heparin Code Status: full  Family Communication: mother over phone Disposition Plan: pending   Consultants:   ID  Neurosurgery  Cardiology  Procedures:  Study Conclusions  - Left ventricle: The cavity size was normal. Systolic function was   normal. The estimated ejection fraction was in the range of 55%   to 60%. Wall motion was normal; there were no regional wall   motion abnormalities. Left ventricular diastolic function   parameters were normal. - Aortic valve: Transvalvular velocity was within the normal range.   There was no stenosis. There was no regurgitation. - Mitral valve: Thickening of the anterior leaflet. Transvalvular   velocity was within the normal range. There was no evidence  for   stenosis. There was trivial regurgitation. - Left atrium: The atrium was mildly dilated. - Right ventricle: The cavity size was normal. Wall thickness was   normal. Systolic function was normal. - Right atrium: The atrium was severely dilated. - Tricuspid valve: There was a medium-sized, 1.0 cm (W) x 1.7 cm   (L) vegetation on the septal leaflet. There was severe   regurgitation. - Pulmonary arteries: Systolic pressure was mildly increased. PA   peak pressure: 42 mm Hg (S).  Impressions:  - Vegetation noted on the septal leaflet of the tricuspid valve   consistent with endocarditis. Cannot exlude vegetation on the   anterior leaflet of the mitral valve. Suggest TEE to better   assess.  Antimicrobials:  Anti-infectives (From admission, onward)   Start     Dose/Rate Route Frequency Ordered Stop   12/06/17 2200  ceFAZolin (ANCEF) IVPB 2g/100 mL premix     2 g 200 mL/hr over 30 Minutes Intravenous Every 8 hours 12/06/17 1016 01/29/18 2359   12/06/17 0600  vancomycin (VANCOCIN) IVPB 1000 mg/200 mL premix  Status:  Discontinued     1,000 mg 200 mL/hr over 60 Minutes Intravenous Every 8 hours 12/05/17 2316 12/06/17 1200   12/06/17 0000  cefTRIAXone (ROCEPHIN) 2 g in sodium chloride 0.9 % 100 mL IVPB  Status:  Discontinued  2 g 200 mL/hr over 30 Minutes Intravenous Every 24 hours 12/05/17 2238 12/06/17 1016   12/05/17 2300  vancomycin (VANCOCIN) 1,750 mg in sodium chloride 0.9 % 500 mL IVPB     1,750 mg 250 mL/hr over 120 Minutes Intravenous  Once 12/05/17 2246 12/06/17 0228   12/05/17 2055  vancomycin (VANCOCIN) powder  Status:  Discontinued       As needed 12/05/17 2055 12/05/17 2118   12/05/17 2001  bacitracin 50,000 Units in sodium chloride 0.9 % 500 mL irrigation  Status:  Discontinued       As needed 12/05/17 2002 12/05/17 2118   12/03/17 1600  azithromycin (ZITHROMAX) 500 mg in sodium chloride 0.9 % 250 mL IVPB  Status:  Discontinued     500 mg 250 mL/hr over 60  Minutes Intravenous Every 24 hours 12/02/17 1706 12/03/17 0948   12/03/17 1400  ceFAZolin (ANCEF) IVPB 2g/100 mL premix  Status:  Discontinued     2 g 200 mL/hr over 30 Minutes Intravenous Every 8 hours 12/03/17 0948 12/05/17 2238   12/03/17 1000  vancomycin (VANCOCIN) IVPB 750 mg/150 ml premix  Status:  Discontinued     750 mg 150 mL/hr over 60 Minutes Intravenous Every 12 hours 12/02/17 1710 12/03/17 0948   12/02/17 2200  piperacillin-tazobactam (ZOSYN) IVPB 3.375 g  Status:  Discontinued     3.375 g 12.5 mL/hr over 240 Minutes Intravenous Every 8 hours 12/02/17 1710 12/03/17 0948   12/02/17 1800  vancomycin (VANCOCIN) 1,500 mg in sodium chloride 0.9 % 500 mL IVPB     1,500 mg 250 mL/hr over 120 Minutes Intravenous  Once 12/02/17 1710 12/02/17 2110   12/02/17 1530  cefTRIAXone (ROCEPHIN) 1 g in sodium chloride 0.9 % 100 mL IVPB     1 g 200 mL/hr over 30 Minutes Intravenous  Once 12/02/17 1522 12/02/17 1626   12/02/17 1530  azithromycin (ZITHROMAX) 500 mg in sodium chloride 0.9 % 250 mL IVPB     500 mg 250 mL/hr over 60 Minutes Intravenous  Once 12/02/17 1522 12/02/17 1759     Subjective: Persistent LBP. No other complaints.   Objective: Vitals:   12/25/17 1332 12/25/17 2052 12/26/17 0505 12/26/17 1446  BP: 96/61 (!) 97/57 105/67 96/66  Pulse: 82 85 84 93  Resp: 17 16 16 16   Temp: 97.6 F (36.4 C) 97.7 F (36.5 C) 98.3 F (36.8 C) (!) 97.5 F (36.4 C)  TempSrc: Oral Oral Oral Oral  SpO2: 98% 98% 96% 98%  Weight:      Height:        Intake/Output Summary (Last 24 hours) at 12/26/2017 2002 Last data filed at 12/26/2017 1448 Gross per 24 hour  Intake 920 ml  Output -  Net 920 ml   Filed Weights   12/23/17 0543 12/24/17 0642 12/25/17 0525  Weight: 78.5 kg 78.7 kg 78 kg    Examination:  General: No acute distress. Cardiovascular: Heart sounds show a regular rate, and rhythm. No gallops or rubs. No murmurs. No JVD. Lungs: Clear to auscultation bilaterally with good  air movement. No rales, rhonchi or wheezes. Abdomen: Soft, nontender, nondistended Neurological: Alert and oriented 3. Moves all extremities 4. Cranial nerves II through XII grossly intact. Skin: Warm and dry. No rashes or lesions. Extremities: No clubbing or cyanosis. No edema.  Psychiatric: Mood and affect are normal. Insight and judgment are appropriate.   Data Reviewed: I have personally reviewed following labs and imaging studies  CBC: Recent Labs  Lab 12/23/17  4132 12/24/17 0520 12/25/17 0410  WBC 8.5 8.2 8.0  HGB 8.2* 8.5* 8.5*  HCT 26.8* 26.1* 28.0*  MCV 88.7 87.9 89.7  PLT 622* 578* 440*   Basic Metabolic Panel: Recent Labs  Lab 12/21/17 0343 12/22/17 0417 12/23/17 0358 12/24/17 0520 12/25/17 0410  NA 134* 132* 134* 134* 133*  K 4.4 4.7 4.5 5.2* 4.5  CL 93* 92* 95* 94* 96*  CO2 28 29 30 29 26   GLUCOSE 118* 98 110* 103* 127*  BUN 23* 21* 24* 20 18  CREATININE 0.79 0.68 0.85 0.73 0.66  CALCIUM 9.8 9.8 9.7 9.8 9.9  MG  --   --   --  1.9 1.9   GFR: Estimated Creatinine Clearance: 107.7 mL/min (by C-G formula based on SCr of 0.66 mg/dL). Liver Function Tests: No results for input(s): AST, ALT, ALKPHOS, BILITOT, PROT, ALBUMIN in the last 168 hours. No results for input(s): LIPASE, AMYLASE in the last 168 hours. No results for input(s): AMMONIA in the last 168 hours. Coagulation Profile: No results for input(s): INR, PROTIME in the last 168 hours. Cardiac Enzymes: No results for input(s): CKTOTAL, CKMB, CKMBINDEX, TROPONINI in the last 168 hours. BNP (last 3 results) No results for input(s): PROBNP in the last 8760 hours. HbA1C: No results for input(s): HGBA1C in the last 72 hours. CBG: No results for input(s): GLUCAP in the last 168 hours. Lipid Profile: No results for input(s): CHOL, HDL, LDLCALC, TRIG, CHOLHDL, LDLDIRECT in the last 72 hours. Thyroid Function Tests: No results for input(s): TSH, T4TOTAL, FREET4, T3FREE, THYROIDAB in the last 72  hours. Anemia Panel: No results for input(s): VITAMINB12, FOLATE, FERRITIN, TIBC, IRON, RETICCTPCT in the last 72 hours. Sepsis Labs: No results for input(s): PROCALCITON, LATICACIDVEN in the last 168 hours.  No results found for this or any previous visit (from the past 240 hour(s)).       Radiology Studies: Mr Lumbar Spine W Wo Contrast  Result Date: 12/25/2017 CLINICAL DATA:  Previous epidural infection. Persistent pain in the back and pelvis. EXAM: MRI LUMBAR SPINE WITHOUT AND WITH CONTRAST TECHNIQUE: Multiplanar and multiecho pulse sequences of the lumbar spine were obtained without and with intravenous contrast. CONTRAST:  33m MULTIHANCE GADOBENATE DIMEGLUMINE 529 MG/ML IV SOLN, 710mMULTIHANCE GADOBENATE DIMEGLUMINE 529 MG/ML IV SOLN COMPARISON:  12/05/2017 FINDINGS: Segmentation: Same numbering terminology as utilized previously. Five lumbar type vertebral bodies. Alignment:  Unremarkable. Vertebrae: Previous spinal fusion extending as low as L3. Since the previous study, there is been laminectomy at L4 and L5. Conus medullaris and cauda equina: Not visible because of artifact. Paraspinal and other soft tissues: No evidence of drainable fluid collection. Disc levels: No evidence of residual epidural abscess in the lower lumbar region. There is nonspecific enhancing soft tissue within the spinal canal and posterior to the spinal canal along the surgical approach, but no evidence of liquid abscess. IMPRESSION: Status post L4 and L5 laminectomy for evacuation of lower lumbar epidural abscess. Better/expected appearance presently with soft tissue enhancement but no evidence of nonenhancing fluid. Limited information available at L3 and above because of artifact from fusion hardware. Electronically Signed   By: MaNelson Chimes.D.   On: 12/25/2017 15:54   Mr Pelvis W Wo Contrast  Result Date: 12/25/2017 CLINICAL DATA:  Osteomyelitis of the pelvis. EXAM: MRI PELVIS WITHOUT AND WITH CONTRAST  TECHNIQUE: Multiplanar multisequence MR imaging of the pelvis was performed both before and after administration of intravenous contrast. CONTRAST:  1563mULTIHANCE GADOBENATE DIMEGLUMINE 529 MG/ML IV SOLN  COMPARISON:  None. FINDINGS: Bones: No hip fracture, dislocation or avascular necrosis. No periosteal reaction or bone destruction. No aggressive osseous lesion. Normal sacrum and sacroiliac joints. No SI joint widening or erosive changes. Prior L5-S1 laminectomy. Postsurgical changes in the posterior paraspinal soft tissues at L5-S1. Right paracentral discectomy. Broad-based disc bulge eccentric towards the left. Mild left foraminal stenosis. No right foraminal stenosis. Fluid signal in L5-S1 disc space with mild marrow edema on the left side of the disc space concerning for persistent discitis. Mild epidural soft tissue thickening concerning for a phlegmon. No drainable epidural abscess. Articular cartilage and labrum Articular cartilage:  No chondral defect. Labrum: Grossly intact, but evaluation is limited by lack of intraarticular fluid. Joint or bursal effusion Joint effusion:  No hip joint effusion.  No SI joint effusion. Bursae:  No bursa formation. Muscles and tendons Flexors: Normal. Extensors: Normal. Abductors: Normal. Adductors: Normal. Gluteals: Normal. Hamstrings: Normal. Paraspinal: Mild soft tissue edema in the paraspinal muscles at the laminectomy site at L5-S1 likely postsurgical. Other findings Miscellaneous: No pelvic free fluid. No fluid collection or hematoma. No inguinal lymphadenopathy. No inguinal hernia. Small focal areas of T2 hyperintense signal within the subcutaneous fat of the anterior abdominal wall consistent with injection sites. IMPRESSION: 1. Fluid signal in L5-S1 disc space with mild marrow edema on the left side of the disc space concerning for persistent discitis. Mild epidural soft tissue thickening concerning for a phlegmon. No drainable epidural abscess. Postsurgical  changes in the posterior paraspinal soft tissues at L5-S1. Right paracentral discectomy. Broad-based disc bulge eccentric towards the left. Mild left foraminal stenosis. No right foraminal stenosis. Electronically Signed   By: Kathreen Devoid   On: 12/25/2017 13:31        Scheduled Meds: . acetaminophen  1,000 mg Oral Q8H  . bupivacaine liposome  20 mL Infiltration Once  . bupivacaine-EPINEPHrine  50 mL Infiltration Once  . Chlorhexidine Gluconate Cloth  6 each Topical Q2200  . cyclobenzaprine  10 mg Oral TID  . diclofenac sodium  2 g Topical QID  . docusate sodium  100 mg Oral BID  . fluticasone  2 spray Each Nare Daily  . guaiFENesin  1,200 mg Oral BID  . heparin  5,000 Units Subcutaneous Q8H  . lactobacillus  1 g Oral TID WC  . lidocaine  1 patch Transdermal Q24H  . magnesium oxide  400 mg Oral BID  . methadone  60 mg Oral Daily  . nicotine  21 mg Transdermal Daily  . nystatin cream   Topical BID  . pantoprazole  40 mg Oral Daily  . senna-docusate  1 tablet Oral BID  . sodium chloride flush  10-40 mL Intracatheter Q12H  . tamsulosin  0.4 mg Oral Daily   Continuous Infusions: .  ceFAZolin (ANCEF) IV 2 g (12/26/17 1604)  . lactated ringers 10 mL/hr at 12/20/17 0600  . methocarbamol (ROBAXIN) IV 500 mg (12/24/17 0003)     LOS: 24 days    Time spent: over 44 min   Fayrene Helper, MD [facility] Pager [phone]  If 7PM-7AM, please contact night-coverage [email or web address] Password [email or web address] 12/26/2017, 8:02 PM

## 2017-12-26 NOTE — Progress Notes (Signed)
Occupational Therapy Treatment Patient Details Name: Kelly [name] MRN: [id number] DOB: 06/07/1985 Today's Date: 12/26/2017    History of present illness Hollyn Stucky is a 32 y.o. F with sepsis, tachycardia, tachypnea, leukocytosis, hypotension.  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 PMH of polysubstance abuse, ADD, scoliosis sp back surgery (20 years ago per mom). PICC placed 12/08/17.   OT comments  Patient progressing.  Continues to require maxA for LB dressing, will benefit from further AE training due to back precautions and decreased functional control of L ankle.  Issued elastic laces and believe patient will benefit from sock aide, shoe horn and reacher. Min guard for transfers and standing balance, patient requires cueing for safety. Plan for next session to focus on standing grooming tasks.  Plan appropriate.    Follow Up Recommendations  CIR;Supervision/Assistance - 24 hour    Equipment Recommendations  3 in 1 bedside commode;Wheelchair (measurements OT);Hospital bed;Wheelchair cushion (measurements OT)    Recommendations for Other Services      Precautions / Restrictions Precautions Precautions: Back;Fall Precaution Booklet Issued: No Restrictions Weight Bearing Restrictions: No       Mobility Bed Mobility Overal bed mobility: Needs Assistance Bed Mobility: Rolling;Sidelying to Sit Rolling: Modified independent (Device/Increase time) Sidelying to sit: Supervision       General bed mobility comments: Patient requiring increased time and effort and verbal reminder for technique. HOB elevated  Transfers Overall transfer level: Needs assistance Equipment used: Rolling walker (2 wheeled) Transfers: Sit to/from Stand Sit to Stand: Min guard         General transfer comment: min guard for safety, cueing for hand placement during transitions    Balance Overall balance  assessment: Needs assistance Sitting-balance support: No upper extremity supported;Feet supported Sitting balance-Leahy Scale: Good     Standing balance support: During functional activity;Bilateral upper extremity supported Standing balance-Leahy Scale: Fair Standing balance comment: reliant on B UE support, min guard for safety                            ADL either performed or assessed with clinical judgement   ADL Overall ADL's : Needs assistance/impaired     Grooming: Supervision/safety;Sitting;Oral care(at sink) Grooming Details (indicate cue type and reason): at sink on [facility]             Lower Body Dressing: Maximal assistance;Sit to/from stand;With adaptive equipment Lower Body Dressing Details (indicate cue type and reason): issued elastic laces, guiding support to understand use of shoe horn to don shoes; will benefit from further AE training  Toilet Transfer: Min guard;RW(simulated in room ) Toilet Transfer Details (indicate cue type and reason): min guard for safety and balance, cueing for hand placement and safety          Functional mobility during ADLs: Rolling walker;Min guard General ADL Comments: Improved management of RW during mobility, cueing for safety and pacing      Vision       Perception     Praxis      Cognition Arousal/Alertness: Awake/alert Behavior During Therapy: WFL for tasks assessed/performed Overall Cognitive Status: Within Functional Limits for tasks assessed                                          Exercises  Shoulder Instructions       General Comments      Pertinent Vitals/ Pain       Pain Assessment: Faces Faces Pain Scale: Hurts even more Pain Location: "back spasm" Pain Descriptors / Indicators: Grimacing;Guarding;Aching Pain Intervention(s): Monitored during session;Patient requesting pain meds-RN notified;Repositioned  Home Living                                           Prior Functioning/Environment              Frequency  Min 2X/week        Progress Toward Goals  OT Goals(current goals can now be found in the care plan section)  Progress towards OT goals: Progressing toward goals  Acute Rehab OT Goals Patient Stated Goal: to just feel better OT Goal Formulation: With patient/family Time For Goal Achievement: 01/07/18 Potential to Achieve Goals: Good ADL Goals Pt Will Perform Grooming: standing;with min guard assist Pt Will Transfer to Toilet: with supervision;ambulating;grab bars;regular height toilet Pt Will Perform Toileting - Clothing Manipulation and hygiene: with supervision;sit to/from stand;sitting/lateral leans  Plan Frequency remains appropriate;Discharge plan remains appropriate    Co-evaluation                 AM-PAC PT "6 Clicks" Daily Activity     Outcome Measure   Help from another person eating meals?: None Help from another person taking care of personal grooming?: A Little Help from another person toileting, which includes using toliet, bedpan, or urinal?: A Lot Help from another person bathing (including washing, rinsing, drying)?: A Lot Help from another person to put on and taking off regular upper body clothing?: A Little Help from another person to put on and taking off regular lower body clothing?: A Lot 6 Click Score: 16    End of Session Equipment Utilized During Treatment: Gait belt;Rolling walker  OT Visit Diagnosis: Unsteadiness on feet (R26.81);Other abnormalities of gait and mobility (R26.89);Pain Pain - Right/Left: Left Pain - part of body: (back)   Activity Tolerance Patient tolerated treatment well   Patient Left in bed;with call bell/phone within reach   Nurse Communication Patient requests pain meds;Mobility status        Time: 1430-1501 OT Time Calculation (min): 31 min  Charges: OT General Charges $OT Visit: 1 Visit OT Treatments $Self Care/Home Management :  23-37 mins  Delight Stare, OTR/L  Pager [place] 12/26/2017, 3:16 PM

## 2017-12-27 LAB — CBC
HEMATOCRIT: 28.1 % — AB (ref 36.0–46.0)
HEMOGLOBIN: 8.5 g/dL — AB (ref 12.0–15.0)
MCH: 27 pg (ref 26.0–34.0)
MCHC: 30.2 g/dL (ref 30.0–36.0)
MCV: 89.2 fL (ref 78.0–100.0)
Platelets: 613 10*3/uL — ABNORMAL HIGH (ref 150–400)
RBC: 3.15 MIL/uL — ABNORMAL LOW (ref 3.87–5.11)
RDW: 15.2 % (ref 11.5–15.5)
WBC: 7.6 10*3/uL (ref 4.0–10.5)

## 2017-12-27 LAB — MAGNESIUM: Magnesium: 1.8 mg/dL (ref 1.7–2.4)

## 2017-12-27 LAB — BASIC METABOLIC PANEL
ANION GAP: 7 (ref 5–15)
BUN: 20 mg/dL (ref 6–20)
CHLORIDE: 97 mmol/L — AB (ref 98–111)
CO2: 30 mmol/L (ref 22–32)
Calcium: 9.6 mg/dL (ref 8.9–10.3)
Creatinine, Ser: 0.62 mg/dL (ref 0.44–1.00)
GFR calc non Af Amer: >60 mL/min (ref >60)
Glucose, Bld: 104 mg/dL — ABNORMAL HIGH (ref 70–99)
Potassium: 4.4 mmol/L (ref 3.5–5.1)
Sodium: 134 mmol/L — ABNORMAL LOW (ref 135–145)

## 2017-12-27 NOTE — Progress Notes (Signed)
Physical Therapy Treatment Patient Details Name: Kelly [name] MRN: [id number] DOB: [birth year] Today's Date: 12/27/2017    History of Present Illness Kelly [name] is a 32 y.o. F with sepsis, tachycardia, tachypnea, leukocytosis, hypotension.  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 PMH of polysubstance abuse, ADD, scoliosis sp back surgery (20 years ago per mom). PICC placed 12/08/17.    PT Comments    Pt requires help with trip to bathroom to manage her sitting control and to help with avoiding rotation to clean up afterward.  Her walking with assistance is better regarding controlling L ankle but is still requiring help to move safely.  Pt asked about prognosis for strengthening LE's and prompted to ask surgeon as he is more qualified to give her realistic expectations.  Continue to work toward LE strengthening and balance.   Follow Up Recommendations  CIR     Equipment Recommendations  Rolling walker with 5" wheels;3in1 (PT)    Recommendations for Other Services Rehab consult     Precautions / Restrictions Precautions Precautions: Back;Fall Precaution Comments: cued back precautions for bed mob Restrictions Weight Bearing Restrictions: No    Mobility  Bed Mobility Overal bed mobility: Needs Assistance Bed Mobility: Rolling;Supine to Sit;Sit to Supine Rolling: Modified independent (Device/Increase time)   Supine to sit: Supervision Sit to supine: Supervision   General bed mobility comments: cued for body mechanics  Transfers Overall transfer level: Needs assistance Equipment used: Rolling walker (2 wheeled) Transfers: Sit to/from Stand Sit to Stand: Min guard            Ambulation/Gait Ambulation/Gait assistance: Min guard;+2 physical assistance;+2 safety/equipment(second person with chair ) Gait Distance (Feet): 80 Feet Assistive device: Rolling walker (2 wheeled) Gait  Pattern/deviations: Step-through pattern;Decreased stride length;Wide base of support;Trunk flexed Gait velocity: Decreased Gait velocity interpretation: <1.31 ft/sec, indicative of household ambulator General Gait Details: pt is doing well controlling L ankle such that she is not losing control of LLE   Stairs             Wheelchair Mobility    Modified Rankin (Stroke Patients Only)       Balance Overall balance assessment: Needs assistance Sitting-balance support: Feet supported Sitting balance-Leahy Scale: Good Sitting balance - Comments: prompts to move feet in place under her     Standing balance-Leahy Scale: Fair Standing balance comment: fair once set with walker                            Cognition Arousal/Alertness: Awake/alert Behavior During Therapy: WFL for tasks assessed/performed Overall Cognitive Status: Within Functional Limits for tasks assessed                                        Exercises      General Comments General comments (skin integrity, edema, etc.): applied shoes for pt      Pertinent Vitals/Pain Pain Assessment: Faces Faces Pain Scale: Hurts little more Pain Location: "back spasm" Pain Descriptors / Indicators: Grimacing Pain Intervention(s): Limited activity within patient's tolerance;Monitored during session;Premedicated before session;Repositioned    Home Living                      Prior Function            PT  Goals (current goals can now be found in the care plan section) Progress towards PT goals: Progressing toward goals    Frequency    Min 3X/week      PT Plan Current plan remains appropriate    Co-evaluation              AM-PAC PT "6 Clicks" Daily Activity  Outcome Measure  Difficulty turning over in bed (including adjusting bedclothes, sheets and blankets)?: A Little Difficulty moving from lying on back to sitting on the side of the bed? : A  Little Difficulty sitting down on and standing up from a chair with arms (e.g., wheelchair, bedside commode, etc,.)?: Unable Help needed moving to and from a bed to chair (including a wheelchair)?: A Little Help needed walking in hospital room?: A Little Help needed climbing 3-5 steps with a railing? : A Lot 6 Click Score: 15    End of Session Equipment Utilized During Treatment: Gait belt Activity Tolerance: Patient tolerated treatment well Patient left: with call bell/phone within reach;in chair Nurse Communication: Mobility status PT Visit Diagnosis: Unsteadiness on feet (R26.81);Other abnormalities of gait and mobility (R26.89);Muscle weakness (generalized) (M62.81);Difficulty in walking, not elsewhere classified (R26.2);Pain;Other symptoms and signs involving the nervous system (R29.898) Pain - Right/Left: (spine)     Time: 1700-1749 PT Time Calculation (min) (ACUTE ONLY): 27 min  Charges:  $Gait Training: 8-22 mins $Therapeutic Activity: 8-22 mins                    Ramond Dial 12/27/2017, 8:10 PM   Mee Hives, PT MS Acute Rehab Dept. Number: [place] and [place]

## 2017-12-27 NOTE — Progress Notes (Signed)
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 Patient also complained of back pain. MRI of the lumbar spine suggested epidural abscess. Patient was transferred to [facility] and underwent lumbar laminectomy for drainage of the epidural abscess.  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  Infectious disease was consulted.   Patient remains on Cefazolin.  RPR nonreactive. HIV nonreactive. Hepatitis C RNA by PCR negative.  Hepatitis A and B neg.  Echocardiogram suggests severe tricuspid regurgitation.  Cardiology was consulted. They recommend completing treatment with IV antibiotics and repeat echocardiogram afterwards to determine if she will need further intervention. No plans for TEE currently. Per ID she will need 8 weeks of IV cefazolin through September 18. And may need oral treatment subsequently.    Lumbar epidural abscess She is status post lumbar laminectomy and drainage of abscess on 7/25.   Patient developed urinary retention and required Foley catheterization, but foley now removed.   She is experiencing some  stool incontinence as well.   Has had some LLE weakness since that time Does not appear to have any new neurological deficits.   Foley catheter was removed on 8/9.  Continue flomax MRI L spine/pelvis done on 8/14 notable for persistent discitis and possible epidural soft tissue thickening concerning for phlegmon.  No drainable abscess.  Discussed findings with neurosurgery and no findings that need intervention.  Left back and flank pain UA with WBC's, negative nitrite, mod LE (in setting of recent foley placement).  Small RBC, 0-5.  Repeat imaging as noted above, follow  Discussed pretreating for PT  Hyperkalemia/Low Cortisol K fluctuating, follow.  Will try to discuss with endo, but appears table for now. Received kayexalate 8/9.   Patient's sodium also noted to be low.   Cortisol level was checked which is noted to be low.  Patient underwent a ACTH stimulation test this morning.  Her cortisol level did increase from a basal value of 2.7 to a 60-minute value of 15.5.  This is a suboptimal response but not remarkably abnormal either.   Thyroid function tests are normal.   ACTH level is pending.  At this time there is no clear indication to initiate steroids.  Continue to monitor her electrolytes.  Await ACTH levels (19.3).  Will plan to discuss with endocrine.     History of scoliosis status post previous surgery/chronic back pain Patient mentioned that she takes methadone prescribed by an outpatient clinic. [name] prescriber database was queried and did not show any prescriptions for methadone. [facility] [place] was called by previous provider. Apparently patient was being given 80 mg of methadone at the clinic (no prescription) starting in April  and last dose was on October 25, 2017. But patient has not returned to the clinic since that and so she was discharged. And since no prescription was written, this information apparently does not show up on the [place] prescribers  database.  Patient was started on methadone here in the hospital. Currently on 60 mg.  Her pain seems to be better controlled.  She did mention that the patient's mother has contacted the clinic and they have agreed to take her back once she is discharged from the hospital.  So we will leave her on current dose of methadone.  Patient was told that we will be unable to prescribe this medication at discharge.  Mild acute renal failure with hyponatremia Renal function is normal.  She is noted to have mild hyponatremia which has been stable.  Follow.    Polysubstance abuse Will need ongoing counseling and outpatient rehab.  Patient to follow-up with the rehab clinic post discharge.  They will take up the responsibility of prescribing methadone.  Vaginal candidiasis Continue topical agents.  Urinary retention  Bowel incontinence Per neurosurgery her bowel and bladder issues will eventually resolve but nothing to be done for it right now. Improving slowly.  Still with some incontinence of stool.   Foley catheter was discontinued on 8/9.  Is able to urinate on own.  Continue Flomax.  Bladder scans as needed.  Stable.  Normocytic anemia stable, follow   Anasarca Improved, stable.  DVT prophylaxis: heparin Code Status: full  Family Communication: mother over phone Disposition Plan: pending   Consultants:   ID  Neurosurgery  Cardiology  Procedures:  Study Conclusions  - Left ventricle: The cavity size was normal. Systolic function was   normal. The estimated ejection fraction was in the range of 55%   to 60%. Wall motion was normal; there were no regional wall   motion abnormalities. Left ventricular diastolic function   parameters were normal. - Aortic valve: Transvalvular velocity was within the normal range.   There was no stenosis. There was no regurgitation. - Mitral valve: Thickening of the anterior leaflet. Transvalvular   velocity was within the normal range.  There was no evidence for   stenosis. There was trivial regurgitation. - Left atrium: The atrium was mildly dilated. - Right ventricle: The cavity size was normal. Wall thickness was   normal. Systolic function was normal. - Right atrium: The atrium was severely dilated. - Tricuspid valve: There was a medium-sized, 1.0 cm (W) x 1.7 cm   (L) vegetation on the septal leaflet. There was severe   regurgitation. - Pulmonary arteries: Systolic pressure was mildly increased. PA   peak pressure: 42 mm Hg (S).  Impressions:  - Vegetation noted on the septal leaflet of the tricuspid valve   consistent with endocarditis. Cannot exlude vegetation on the   anterior leaflet of the mitral valve. Suggest TEE to better   assess.  Antimicrobials:  Anti-infectives (From admission, onward)   Start     Dose/Rate Route Frequency Ordered Stop   12/06/17 2200  ceFAZolin (ANCEF) IVPB 2g/100 mL premix     2 g 200 mL/hr over 30 Minutes Intravenous Every 8 hours 12/06/17 1016 01/29/18 2359   12/06/17 0600  vancomycin (VANCOCIN) IVPB 1000 mg/200 mL premix  Status:  Discontinued     1,000 mg 200 mL/hr over 60 Minutes Intravenous Every 8 hours 12/05/17 2316 12/06/17 1200   12/06/17 0000  cefTRIAXone (ROCEPHIN) 2 g in sodium chloride 0.9 % 100 mL IVPB  Status:  Discontinued     2 g 200 mL/hr over 30 Minutes Intravenous Every 24 hours 12/05/17 2238 12/06/17 1016   12/05/17 2300  vancomycin (VANCOCIN) 1,750 mg in sodium chloride 0.9 % 500 mL IVPB     1,750 mg 250 mL/hr over 120 Minutes Intravenous  Once 12/05/17 2246 12/06/17 0228   12/05/17 2055  vancomycin (VANCOCIN) powder  Status:  Discontinued       As needed 12/05/17 2055 12/05/17 2118   12/05/17 2001  bacitracin 50,000 Units in sodium chloride 0.9 % 500 mL irrigation  Status:  Discontinued       As needed 12/05/17 2002 12/05/17 2118   12/03/17 1600  azithromycin (ZITHROMAX) 500 mg in sodium chloride 0.9 % 250 mL IVPB  Status:  Discontinued     500  mg 250 mL/hr over 60 Minutes Intravenous Every 24 hours 12/02/17 1706 12/03/17 0948   12/03/17 1400  ceFAZolin (ANCEF) IVPB 2g/100 mL premix  Status:  Discontinued     2 g 200 mL/hr over 30 Minutes Intravenous Every 8 hours 12/03/17 0948 12/05/17 2238   12/03/17 1000  vancomycin (VANCOCIN) IVPB 750 mg/150 ml premix  Status:  Discontinued     750 mg 150 mL/hr over 60 Minutes Intravenous Every 12 hours 12/02/17 1710 12/03/17 0948   12/02/17 2200  piperacillin-tazobactam (ZOSYN) IVPB 3.375 g  Status:  Discontinued     3.375 g 12.5 mL/hr over 240 Minutes Intravenous Every 8 hours 12/02/17 1710 12/03/17 0948   12/02/17 1800  vancomycin (VANCOCIN) 1,500 mg in sodium chloride 0.9 % 500 mL IVPB     1,500 mg 250 mL/hr over 120 Minutes Intravenous  Once 12/02/17 1710 12/02/17 2110   12/02/17 1530  cefTRIAXone (ROCEPHIN) 1 g in sodium chloride 0.9 % 100 mL IVPB     1 g 200 mL/hr over 30 Minutes Intravenous  Once 12/02/17 1522 12/02/17 1626   12/02/17 1530  azithromycin (ZITHROMAX) 500 mg in sodium chloride 0.9 % 250 mL IVPB     500 mg 250 mL/hr over 60 Minutes Intravenous  Once 12/02/17 1522 12/02/17 1759     Subjective: Persistent LBP and spasms. No other complaints.   Objective: Vitals:   12/26/17 1446 12/26/17 2113 12/27/17 0536 12/27/17 1431  BP: 96/66 104/60 105/67 103/68  Pulse: 93 90 76 95  Resp: 16 16 16 16   Temp: (!) 97.5 F (36.4 C) 98.6 F (37 C) 98.5 F (36.9 C) (!) 97.5 F (36.4 C)  TempSrc: Oral Oral Oral Oral  SpO2: 98% 97% 99% 97%  Weight:  77.6 kg    Height:        Intake/Output Summary (Last 24 hours) at 12/27/2017 1757 Last data filed at 12/27/2017 1415 Gross per 24 hour  Intake 610 ml  Output -  Net 610 ml   Filed Weights   12/24/17 0642 12/25/17 0525 12/26/17 2113  Weight: 78.7 kg 78 kg 77.6 kg    Examination:  General: No acute distress. Cardiovascular: Heart sounds show a regular rate, and rhythm.  Lungs: Clear to auscultation bilaterally with  good air movement.  Abdomen: Soft, nontender, nondistended  Neurological: Alert and oriented 3. Cranial nerves II through XII grossly intact.  No new deficits Skin: Warm and dry. No rashes or lesions. Extremities: No clubbing or cyanosis. No edema.  TTP to L buttocks. Psychiatric: Mood and affect are normal. Insight and judgment are appropriate.   Data Reviewed: I have personally reviewed following labs and imaging studies  CBC: Recent  Labs  Lab 12/23/17 0358 12/24/17 0520 12/25/17 0410 12/27/17 0432  WBC 8.5 8.2 8.0 7.6  HGB 8.2* 8.5* 8.5* 8.5*  HCT 26.8* 26.1* 28.0* 28.1*  MCV 88.7 87.9 89.7 89.2  PLT 622* 578* 626* 193*   Basic Metabolic Panel: Recent Labs  Lab 12/22/17 0417 12/23/17 0358 12/24/17 0520 12/25/17 0410 12/27/17 0432  NA 132* 134* 134* 133* 134*  K 4.7 4.5 5.2* 4.5 4.4  CL 92* 95* 94* 96* 97*  CO2 29 30 29 26 30   GLUCOSE 98 110* 103* 127* 104*  BUN 21* 24* 20 18 20   CREATININE 0.68 0.85 0.73 0.66 0.62  CALCIUM 9.8 9.7 9.8 9.9 9.6  MG  --   --  1.9 1.9 1.8   GFR: Estimated Creatinine Clearance: 107.4 mL/min (by C-G formula based on SCr of 0.62 mg/dL). Liver Function Tests: No results for input(s): AST, ALT, ALKPHOS, BILITOT, PROT, ALBUMIN in the last 168 hours. No results for input(s): LIPASE, AMYLASE in the last 168 hours. No results for input(s): AMMONIA in the last 168 hours. Coagulation Profile: No results for input(s): INR, PROTIME in the last 168 hours. Cardiac Enzymes: No results for input(s): CKTOTAL, CKMB, CKMBINDEX, TROPONINI in the last 168 hours. BNP (last 3 results) No results for input(s): PROBNP in the last 8760 hours. HbA1C: No results for input(s): HGBA1C in the last 72 hours. CBG: No results for input(s): GLUCAP in the last 168 hours. Lipid Profile: No results for input(s): CHOL, HDL, LDLCALC, TRIG, CHOLHDL, LDLDIRECT in the last 72 hours. Thyroid Function Tests: No results for input(s): TSH, T4TOTAL, FREET4, T3FREE,  THYROIDAB in the last 72 hours. Anemia Panel: No results for input(s): VITAMINB12, FOLATE, FERRITIN, TIBC, IRON, RETICCTPCT in the last 72 hours. Sepsis Labs: No results for input(s): PROCALCITON, LATICACIDVEN in the last 168 hours.  No results found for this or any previous visit (from the past 240 hour(s)).       Radiology Studies: No results found.      Scheduled Meds: . acetaminophen  1,000 mg Oral Q8H  . bupivacaine liposome  20 mL Infiltration Once  . bupivacaine-EPINEPHrine  50 mL Infiltration Once  . Chlorhexidine Gluconate Cloth  6 each Topical Q2200  . cyclobenzaprine  10 mg Oral TID  . diclofenac sodium  2 g Topical QID  . docusate sodium  100 mg Oral BID  . fluticasone  2 spray Each Nare Daily  . guaiFENesin  1,200 mg Oral BID  . heparin  5,000 Units Subcutaneous Q8H  . lactobacillus  1 g Oral TID WC  . lidocaine  1 patch Transdermal Q24H  . magnesium oxide  400 mg Oral BID  . methadone  60 mg Oral Daily  . nicotine  21 mg Transdermal Daily  . nystatin cream   Topical BID  . pantoprazole  40 mg Oral Daily  . senna-docusate  1 tablet Oral BID  . sodium chloride flush  10-40 mL Intracatheter Q12H  . tamsulosin  0.4 mg Oral Daily   Continuous Infusions: .  ceFAZolin (ANCEF) IV 2 g (12/27/17 1444)  . lactated ringers 10 mL/hr at 12/20/17 0600  . methocarbamol (ROBAXIN) IV 500 mg (12/24/17 0003)     LOS: 25 days    Time spent: over 43 min   Fayrene Helper, MD [facility] Pager [phone]  If 7PM-7AM, please contact night-coverage [email or web address] Password [name] 12/27/2017, 5:57 PM

## 2017-12-28 NOTE — Progress Notes (Signed)
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 Patient also complained of back pain. MRI of the lumbar spine suggested epidural abscess. Patient was transferred to [facility] and underwent lumbar laminectomy for drainage of the epidural abscess.  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  Infectious disease was consulted.   Patient remains on Cefazolin.  RPR nonreactive. HIV nonreactive. Hepatitis C RNA by PCR negative.  Hepatitis Salif Tay and B neg.  Echocardiogram suggests severe tricuspid regurgitation.  Cardiology was consulted. They recommend completing treatment with IV antibiotics and repeat echocardiogram afterwards to determine if she will need further intervention. No plans for TEE currently. Per ID she will need 8 weeks of IV cefazolin through September 18. And may need oral treatment subsequently.    Lumbar epidural abscess She is status post lumbar laminectomy and drainage of abscess on 7/25.   Patient developed urinary retention and required Foley catheterization, but foley now removed.   She is experiencing some  stool incontinence as well.   Has had some LLE weakness since that time Does not appear to have any new neurological deficits.   Foley catheter was removed on 8/9.  Continue flomax MRI L spine/pelvis done on 8/14 notable for persistent discitis and possible epidural soft tissue thickening concerning for phlegmon.  No drainable abscess.  Discussed findings with neurosurgery and no findings that need intervention.  Left back and flank pain UA with WBC's, negative nitrite, mod LE (in setting of recent foley placement).  Small RBC, 0-5.  Repeat imaging as noted above, follow  Discussed pretreating for PT Stable  Hyperkalemia/Low Cortisol K fluctuating, follow.  Will try to discuss with endo, but appears table for now. Received kayexalate 8/9.   Patient's sodium also noted to be low.   Cortisol level was checked which is noted to be low.  Patient underwent Elan Mcelvain ACTH stimulation test this morning.  Her cortisol level did increase from Lilburn Straw basal value of 2.7 to Nakiea Metzner 60-minute value of 15.5.  This is Kregg Cihlar suboptimal response but not remarkably abnormal either.   Thyroid function tests are normal.   ACTH level is pending.  At this time there is no clear indication to initiate steroids.  Continue to monitor her electrolytes.  Await ACTH levels (19.3).  Will plan to discuss with endocrine.     History of scoliosis status post previous surgery/chronic back pain Patient mentioned that she takes methadone prescribed by an outpatient clinic. [facility] prescriber database was queried and did not show any prescriptions for methadone. [facility] in [place] was called by previous provider. Apparently patient was being given 80 mg of methadone at the clinic (no prescription) starting in  April and last dose was on October 25, 2017. But patient has not returned to the clinic since that and so she was discharged. And since no prescription was written, this information apparently does not show up on the [place]  prescribers database.  Patient was started on methadone here in the hospital. Currently on 60 mg.  Her pain seems to be better controlled.  She did mention that the patient's mother has contacted the clinic and they have agreed to take her back once she is discharged from the hospital.  So we will leave her on current dose of methadone.  Patient was told that we will be unable to prescribe this medication at discharge.  Mild acute renal failure with hyponatremia Renal function is normal.  She is noted to have mild hyponatremia which has been stable.  Follow.    Polysubstance abuse Will need ongoing counseling and outpatient rehab.  Patient to follow-up with the rehab clinic post discharge.  They will take up the responsibility of prescribing methadone.  Vaginal candidiasis Continue topical agents.  Urinary retention  Bowel incontinence Per neurosurgery her bowel and bladder issues will eventually resolve but nothing to be done for it right now. Improving slowly.  Still with some incontinence of stool.   Foley catheter was discontinued on 8/9.  Is able to urinate on own.  Continue Flomax.  Bladder scans as needed.  Stable.  Normocytic anemia stable, follow   Anasarca Improved, stable.  DVT prophylaxis: heparin Code Status: full  Family Communication: mother over phone Disposition Plan: pending   Consultants:   ID  Neurosurgery  Cardiology  Procedures:  Study Conclusions  - Left ventricle: The cavity size was normal. Systolic function was   normal. The estimated ejection fraction was in the range of 55%   to 60%. Wall motion was normal; there were no regional wall   motion abnormalities. Left ventricular diastolic function   parameters were normal. - Aortic valve: Transvalvular velocity was within the normal range.   There was no stenosis. There was no regurgitation. - Mitral valve: Thickening of the anterior leaflet. Transvalvular   velocity was within the  normal range. There was no evidence for   stenosis. There was trivial regurgitation. - Left atrium: The atrium was mildly dilated. - Right ventricle: The cavity size was normal. Wall thickness was   normal. Systolic function was normal. - Right atrium: The atrium was severely dilated. - Tricuspid valve: There was Tascha Casares medium-sized, 1.0 cm (W) x 1.7 cm   (L) vegetation on the septal leaflet. There was severe   regurgitation. - Pulmonary arteries: Systolic pressure was mildly increased. PA   peak pressure: 42 mm Hg (S).  Impressions:  - Vegetation noted on the septal leaflet of the tricuspid valve   consistent with endocarditis. Cannot exlude vegetation on the   anterior leaflet of the mitral valve. Suggest TEE to better   assess.  Antimicrobials:  Anti-infectives (From admission, onward)   Start     Dose/Rate Route Frequency Ordered Stop   12/06/17 2200  ceFAZolin (ANCEF) IVPB 2g/100 mL premix     2 g 200 mL/hr over 30 Minutes Intravenous Every 8 hours 12/06/17 1016 01/29/18 2359   12/06/17 0600  vancomycin (VANCOCIN) IVPB 1000 mg/200 mL premix  Status:  Discontinued     1,000 mg 200 mL/hr over 60 Minutes Intravenous Every 8 hours 12/05/17 2316 12/06/17 1200   12/06/17 0000  cefTRIAXone (ROCEPHIN) 2 g in sodium chloride 0.9 % 100 mL IVPB  Status:  Discontinued     2 g 200 mL/hr over 30 Minutes Intravenous Every 24 hours 12/05/17 2238 12/06/17 1016   12/05/17 2300  vancomycin (VANCOCIN) 1,750 mg in sodium chloride 0.9 % 500 mL IVPB     1,750 mg 250 mL/hr over 120 Minutes Intravenous  Once 12/05/17 2246 12/06/17 0228   12/05/17 2055  vancomycin (VANCOCIN) powder  Status:  Discontinued       As needed 12/05/17 2055 12/05/17 2118   12/05/17 2001  bacitracin 50,000 Units in sodium chloride 0.9 % 500 mL irrigation  Status:  Discontinued       As needed 12/05/17 2002 12/05/17 2118   12/03/17 1600  azithromycin (ZITHROMAX) 500 mg in sodium chloride 0.9 % 250 mL IVPB  Status:  Discontinued      500 mg 250 mL/hr over 60 Minutes Intravenous Every 24 hours 12/02/17 1706 12/03/17 0948   12/03/17 1400  ceFAZolin (ANCEF) IVPB 2g/100 mL premix  Status:  Discontinued     2 g 200 mL/hr over 30 Minutes Intravenous Every 8 hours 12/03/17 0948 12/05/17 2238   12/03/17 1000  vancomycin (VANCOCIN) IVPB 750 mg/150 ml premix  Status:  Discontinued     750 mg 150 mL/hr over 60 Minutes Intravenous Every 12 hours 12/02/17 1710 12/03/17 0948   12/02/17 2200  piperacillin-tazobactam (ZOSYN) IVPB 3.375 g  Status:  Discontinued     3.375 g 12.5 mL/hr over 240 Minutes Intravenous Every 8 hours 12/02/17 1710 12/03/17 0948   12/02/17 1800  vancomycin (VANCOCIN) 1,500 mg in sodium chloride 0.9 % 500 mL IVPB     1,500 mg 250 mL/hr over 120 Minutes Intravenous  Once 12/02/17 1710 12/02/17 2110   12/02/17 1530  cefTRIAXone (ROCEPHIN) 1 g in sodium chloride 0.9 % 100 mL IVPB     1 g 200 mL/hr over 30 Minutes Intravenous  Once 12/02/17 1522 12/02/17 1626   12/02/17 1530  azithromycin (ZITHROMAX) 500 mg in sodium chloride 0.9 % 250 mL IVPB     500 mg 250 mL/hr over 60 Minutes Intravenous  Once 12/02/17 1522 12/02/17 1759     Subjective: Sitting up in bed. Pain seems better Feeling good. Still with occasional spasms.  Objective: Vitals:   12/27/17 2126 12/28/17 0459 12/28/17 0459 12/28/17 1409  BP: (!) 101/59  113/80 101/76  Pulse: (!) 110  77 (!) 103  Resp:   16 20  Temp: 98.4 F (36.9 C)  (!) 97.5 F (36.4 C) 98.3 F (36.8 C)  TempSrc: Oral  Oral Oral  SpO2: 100%  99% 97%  Weight:  77.7 kg    Height:        Intake/Output Summary (Last 24 hours) at 12/28/2017 1827 Last data filed at 12/28/2017 1300 Gross per 24 hour  Intake 1140 ml  Output 2 ml  Net 1138 ml   Filed Weights   12/25/17 0525 12/26/17 2113 12/28/17 0459  Weight: 78 kg 77.6 kg 77.7 kg    Examination:  General: No acute distress. Cardiovascular: Heart sounds show Magdalen Cabana regular rate, and rhythm Lungs: Clear to  auscultation bilaterally with good air movement. Abdomen: Soft, nontender, nondistended Neurological: Alert and oriented 3. Moves all extremities 4. Cranial nerves II through XII grossly intact. Skin: Warm and dry. No rashes or lesions. Extremities: No clubbing or cyanosis.  Psychiatric: Mood and affect are normal. Insight and judgment are appropriate.   Data Reviewed: I have personally reviewed following labs and imaging studies  CBC: Recent Labs  Lab 12/23/17  1833 12/24/17 0520 12/25/17 0410 12/27/17 0432  WBC 8.5 8.2 8.0 7.6  HGB 8.2* 8.5* 8.5* 8.5*  HCT 26.8* 26.1* 28.0* 28.1*  MCV 88.7 87.9 89.7 89.2  PLT 622* 578* 626* 582*   Basic Metabolic Panel: Recent Labs  Lab 12/22/17 0417 12/23/17 0358 12/24/17 0520 12/25/17 0410 12/27/17 0432  NA 132* 134* 134* 133* 134*  K 4.7 4.5 5.2* 4.5 4.4  CL 92* 95* 94* 96* 97*  CO2 29 30 29 26 30   GLUCOSE 98 110* 103* 127* 104*  BUN 21* 24* 20 18 20   CREATININE 0.68 0.85 0.73 0.66 0.62  CALCIUM 9.8 9.7 9.8 9.9 9.6  MG  --   --  1.9 1.9 1.8   GFR: Estimated Creatinine Clearance: 107.4 mL/min (by C-G formula based on SCr of 0.62 mg/dL). Liver Function Tests: No results for input(s): AST, ALT, ALKPHOS, BILITOT, PROT, ALBUMIN in the last 168 hours. No results for input(s): LIPASE, AMYLASE in the last 168 hours. No results for input(s): AMMONIA in the last 168 hours. Coagulation Profile: No results for input(s): INR, PROTIME in the last 168 hours. Cardiac Enzymes: No results for input(s): CKTOTAL, CKMB, CKMBINDEX, TROPONINI in the last 168 hours. BNP (last 3 results) No results for input(s): PROBNP in the last 8760 hours. HbA1C: No results for input(s): HGBA1C in the last 72 hours. CBG: No results for input(s): GLUCAP in the last 168 hours. Lipid Profile: No results for input(s): CHOL, HDL, LDLCALC, TRIG, CHOLHDL, LDLDIRECT in the last 72 hours. Thyroid Function Tests: No results for input(s): TSH, T4TOTAL, FREET4,  T3FREE, THYROIDAB in the last 72 hours. Anemia Panel: No results for input(s): VITAMINB12, FOLATE, FERRITIN, TIBC, IRON, RETICCTPCT in the last 72 hours. Sepsis Labs: No results for input(s): PROCALCITON, LATICACIDVEN in the last 168 hours.  No results found for this or any previous visit (from the past 240 hour(s)).       Radiology Studies: No results found.      Scheduled Meds: . acetaminophen  1,000 mg Oral Q8H  . bupivacaine liposome  20 mL Infiltration Once  . bupivacaine-EPINEPHrine  50 mL Infiltration Once  . Chlorhexidine Gluconate Cloth  6 each Topical Q2200  . cyclobenzaprine  10 mg Oral TID  . diclofenac sodium  2 g Topical QID  . docusate sodium  100 mg Oral BID  . fluticasone  2 spray Each Nare Daily  . guaiFENesin  1,200 mg Oral BID  . heparin  5,000 Units Subcutaneous Q8H  . lactobacillus  1 g Oral TID WC  . lidocaine  1 patch Transdermal Q24H  . magnesium oxide  400 mg Oral BID  . methadone  60 mg Oral Daily  . nicotine  21 mg Transdermal Daily  . nystatin cream   Topical BID  . pantoprazole  40 mg Oral Daily  . senna-docusate  1 tablet Oral BID  . sodium chloride flush  10-40 mL Intracatheter Q12H  . tamsulosin  0.4 mg Oral Daily   Continuous Infusions: .  ceFAZolin (ANCEF) IV 2 g (12/28/17 1415)  . lactated ringers 10 mL/hr at 12/20/17 0600  . methocarbamol (ROBAXIN) IV 500 mg (12/28/17 1228)     LOS: 26 days    Time spent: over 97 min   Fayrene Helper, MD [facility] Pager [phone]  If 7PM-7AM, please contact night-coverage [email or web address] Password [facility] 12/28/2017, 6:27 PM

## 2017-12-29 NOTE — Progress Notes (Signed)
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 Patient also complained of back [name]. MRI of the lumbar spine suggested epidural abscess. Patient was transferred to Kelly [name] and underwent lumbar laminectomy for drainage of the epidural abscess.  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  Infectious disease was consulted.   Patient remains on Cefazolin.  RPR nonreactive. HIV nonreactive. Hepatitis C RNA by PCR negative.  Hepatitis Kelly [name] and Kelly [name].  Echocardiogram suggests severe tricuspid regurgitation.  Cardiology was consulted. They recommend completing treatment with Kelly antibiotics and repeat echocardiogram afterwards to determine if she will need further intervention. No plans for TEE currently. Per ID she will need 8 weeks of Kelly cefazolin through September 18. And may need oral treatment subsequently.    Lumbar epidural abscess She is status post lumbar laminectomy and drainage of abscess on 7/25.   Patient developed urinary retention and required Foley catheterization, but foley now removed.   She is experiencing some  stool incontinence as well.   Has had some LLE weakness since that time Does not appear to have any new neurological deficits.   Foley catheter was removed on 8/9.  Continue flomax MRI L spine/pelvis done on 8/14 notable for persistent discitis and possible epidural soft tissue thickening concerning for phlegmon.  No drainable abscess.  Discussed findings with neurosurgery and no findings that need intervention.  Kelly [name] UA with WBC's, negative nitrite, mod LE (in setting of recent foley placement).  Small RBC, 0-5.  Repeat imaging as noted above, follow  Discussed pretreating for PT C/o muscle spasms today, follow  Hyperkalemia/Low Kelly K fluctuating, follow.  Will try to discuss with endo, but appears table for now. Received kayexalate 8/9.   Patient's sodium also noted to be low.   Kelly [name] was checked which is noted to be low.  Patient underwent Kelly [name] Kelly [name] this morning.  Her Kelly [name] did increase from Kelly [name] basal value of 2.7 to Kelly [name] 60-minute value of 15.5.  This is Kelly [name] but not remarkably abnormal either.   Kelly [name] are normal.   Kelly [name] is pending.  At this time there is no clear indication to initiate steroids.  Continue to monitor her electrolytes.  Await Kelly levels (19.3).  Will plan to discuss with endocrine.     History of scoliosis status post previous surgery/chronic back [name] Patient mentioned that she takes methadone prescribed by an outpatient [name]. [name] prescriber [name] was queried and did not show any prescriptions for methadone. Kelly [name] in Kelly [name] was called by previous provider. Apparently patient was being given 80 mg of methadone at the [name] (  no prescription) starting in April and last dose was on October 25, 2017. But patient has not returned to the [name] since that and so she was discharged. And since no prescription was written, this information apparently does  not show up on the Kelly [name].  Patient was started on methadone here in the hospital. Currently on 60 mg.  Her [name] seems to be better controlled.  She did mention that the patient's mother has contacted the [name] and they have agreed to take her back once she is discharged from the hospital.  So we will leave her on current dose of methadone.  Patient was told that we will be unable to prescribe this medication at discharge.  Mild acute renal failure with hyponatremia Renal function is normal.  She is noted to have mild hyponatremia which has been stable.  Follow.    Kelly [name] Will need ongoing counseling and outpatient rehab.  Patient to follow-up with the rehab [name] post discharge.  They will take up the responsibility of prescribing methadone.  Vaginal candidiasis Continue topical agents.  Urinary retention  Bowel incontinence Per neurosurgery her bowel and bladder issues will eventually resolve but nothing to be done for it right now. Improving slowly.  Still with some incontinence of stool.   Foley catheter was discontinued on 8/9.  Is able to urinate on own.  Continue Flomax.  Bladder scans as needed.  Stable.  Normocytic anemia stable, follow   Anasarca Improved, stable.  DVT prophylaxis: heparin Code Status: full  Family Communication: none at bedside Disposition Plan: pending   Consultants:   ID  Neurosurgery  Cardiology  Procedures:  Study Conclusions  - Kelly ventricle: The cavity size was normal. Systolic function was   normal. The estimated ejection fraction was in the range of 55%   to 60%. Wall motion was normal; there were no regional wall   motion abnormalities. Kelly ventricular diastolic function   parameters were normal. - Aortic valve: Transvalvular velocity was within the normal range.   There was no stenosis. There was no regurgitation. - Mitral valve: Thickening of the anterior leaflet. Transvalvular   velocity  was within the normal range. There was no evidence for   stenosis. There was trivial regurgitation. - Kelly atrium: The atrium was mildly dilated. - Right ventricle: The cavity size was normal. Wall thickness was   normal. Systolic function was normal. - Right atrium: The atrium was severely dilated. 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 There was severe   regurgitation. - Pulmonary arteries: Systolic pressure was mildly increased. PA   peak pressure: 42 mm Hg (S).  Impressions:  - Vegetation noted on the septal leaflet of the tricuspid valve   consistent with Kelly [name]. Cannot exlude vegetation on the   anterior leaflet of the mitral valve. Suggest TEE to better   assess.  Antimicrobials:  Anti-infectives (From admission, onward)   Start     Dose/Rate Route Frequency Ordered Stop   12/06/17 2200  ceFAZolin (ANCEF) IVPB 2g/100 mL premix     2 g 200 mL/hr over 30 Minutes Kelly Every 8 hours 12/06/17 1016 01/29/18 2359   12/06/17 0600  vancomycin (VANCOCIN) IVPB 1000 mg/200 mL premix  Status:  Discontinued     1,000 mg 200 mL/hr over 60 Minutes Kelly Every 8 hours 12/05/17 2316 12/06/17 1200   12/06/17 0000  cefTRIAXone (ROCEPHIN) 2 g in sodium chloride 0.9 %  100 mL IVPB  Status:  Discontinued     2 g 200 mL/hr over 30 Minutes Kelly Every 24 hours 12/05/17 2238 12/06/17 1016   12/05/17 2300  vancomycin (VANCOCIN) 1,750 mg in sodium chloride 0.9 % 500 mL IVPB     1,750 mg 250 mL/hr over 120 Minutes Kelly  Once 12/05/17 2246 12/06/17 0228   12/05/17 2055  vancomycin (VANCOCIN) powder  Status:  Discontinued       As needed 12/05/17 2055 12/05/17 2118   12/05/17 2001  bacitracin 50,000 Units in sodium chloride 0.9 % 500 mL irrigation  Status:  Discontinued       As needed 12/05/17 2002 12/05/17 2118   12/03/17 1600  azithromycin (ZITHROMAX) 500 mg in sodium chloride 0.9 % 250 mL IVPB   Status:  Discontinued     500 mg 250 mL/hr over 60 Minutes Kelly Every 24 hours 12/02/17 1706 12/03/17 0948   12/03/17 1400  ceFAZolin (ANCEF) IVPB 2g/100 mL premix  Status:  Discontinued     2 g 200 mL/hr over 30 Minutes Kelly Every 8 hours 12/03/17 0948 12/05/17 2238   12/03/17 1000  vancomycin (VANCOCIN) IVPB 750 mg/150 ml premix  Status:  Discontinued     750 mg 150 mL/hr over 60 Minutes Kelly Every 12 hours 12/02/17 1710 12/03/17 0948   12/02/17 2200  piperacillin-tazobactam (ZOSYN) IVPB 3.375 g  Status:  Discontinued     3.375 g 12.5 mL/hr over 240 Minutes Kelly Every 8 hours 12/02/17 1710 12/03/17 0948   12/02/17 1800  vancomycin (VANCOCIN) 1,500 mg in sodium chloride 0.9 % 500 mL IVPB     1,500 mg 250 mL/hr over 120 Minutes Kelly  Once 12/02/17 1710 12/02/17 2110   12/02/17 1530  cefTRIAXone (ROCEPHIN) 1 g in sodium chloride 0.9 % 100 mL IVPB     1 g 200 mL/hr over 30 Minutes Kelly  Once 12/02/17 1522 12/02/17 1626   12/02/17 1530  azithromycin (ZITHROMAX) 500 mg in sodium chloride 0.9 % 250 mL IVPB     500 mg 250 mL/hr over 60 Minutes Kelly  Once 12/02/17 1522 12/02/17 1759     Subjective: Sitting up in bed.  Muscle spasms bad today. Ok otherwise.  Objective: Vitals:   12/28/17 1409 12/28/17 2135 12/29/17 0453 12/29/17 1300  BP: 101/76 101/74 104/73 99/69  Pulse: (!) 103 97 75 85  Resp: 20 17 16 18   Temp: 98.3 F (36.8 C) 98.2 F (36.8 C) 98 F (36.7 C) 98.7 F (37.1 C)  TempSrc: Oral Oral Oral Oral  SpO2: 97% 99% 93% 96%  Weight:   77.1 kg   Height:        Intake/Output Summary (Last 24 hours) at 12/29/2017 1854 Last data filed at 12/29/2017 1415 Gross per 24 hour  Intake 1640.95 ml  Output -  Net 1640.95 ml   Filed Weights   12/26/17 2113 12/28/17 0459 12/29/17 0453  Weight: 77.6 kg 77.7 kg 77.1 kg    Examination:  General: No acute distress. Cardiovascular: Heart sounds show Chyanne Kohut regular rate, and  rhythm Lungs: Clear to auscultation bilaterally  Abdomen: Soft, nontender, nondistended Neurological: Alert and oriented 3. Moves all extremities 4. Cranial nerves II through XII grossly intact. Skin: Warm and dry. No rashes or lesions. Extremities: No clubbing or cyanosis. No edema.  Psychiatric: Mood and affect are normal.    Data Reviewed: I have personally reviewed following labs and imaging studies  CBC: Recent Labs  Lab 12/23/17 0358 12/24/17 0520 12/25/17  0410 12/27/17 0432  WBC 8.5 8.2 8.0 7.6  HGB 8.2* 8.5* 8.5* 8.5*  HCT 26.8* 26.1* 28.0* 28.1*  MCV 88.7 87.9 89.7 89.2  PLT 622* 578* 626* 970*   Basic Metabolic Panel: Recent Labs  Lab 12/23/17 0358 12/24/17 0520 12/25/17 0410 12/27/17 0432  NA 134* 134* 133* 134*  K 4.5 5.2* 4.5 4.4  CL 95* 94* 96* 97*  CO2 30 29 26 30   GLUCOSE 110* 103* 127* 104*  BUN 24* 20 18 20   CREATININE 0.85 0.73 0.66 0.62  CALCIUM 9.7 9.8 9.9 9.6  MG  --  1.9 1.9 1.8   GFR: Estimated Creatinine Clearance: 107.1 mL/min (by C-G formula based on SCr of 0.62 mg/dL). Liver Function [name]: No results for input(s): AST, ALT, ALKPHOS, BILITOT, PROT, ALBUMIN in the last 168 hours. No results for input(s): LIPASE, AMYLASE in the last 168 hours. No results for input(s): AMMONIA in the last 168 hours. Coagulation Profile: No results for input(s): INR, PROTIME in the last 168 hours. Cardiac Enzymes: No results for input(s): CKTOTAL, CKMB, CKMBINDEX, TROPONINI in the last 168 hours. BNP (last 3 results) No results for input(s): PROBNP in the last 8760 hours. HbA1C: No results for input(s): HGBA1C in the last 72 hours. CBG: No results for input(s): GLUCAP in the last 168 hours. Lipid Profile: No results for input(s): CHOL, HDL, LDLCALC, TRIG, CHOLHDL, LDLDIRECT in the last 72 hours. Kelly [name]: No results for input(s): TSH, T4TOTAL, FREET4, T3FREE, THYROIDAB in the last 72 hours. Anemia Panel: No results for input(s):  VITAMINB12, FOLATE, FERRITIN, TIBC, IRON, RETICCTPCT in the last 72 hours. Kelly [name] Labs: No results for input(s): PROCALCITON, LATICACIDVEN in the last 168 hours.  No results found for this or any previous visit (from the past 240 hour(s)).       Radiology Studies: No results found.      Scheduled Meds: . acetaminophen  1,000 mg Oral Q8H  . bupivacaine liposome  20 mL Infiltration Once  . bupivacaine-EPINEPHrine  50 mL Infiltration Once  . Chlorhexidine Gluconate Cloth  6 each Topical Q2200  . cyclobenzaprine  10 mg Oral TID  . diclofenac sodium  2 g Topical QID  . docusate sodium  100 mg Oral BID  . fluticasone  2 spray Each Nare Daily  . guaiFENesin  1,200 mg Oral BID  . heparin  5,000 Units Subcutaneous Q8H  . lactobacillus  1 g Oral TID WC  . lidocaine  1 patch Transdermal Q24H  . magnesium oxide  400 mg Oral BID  . methadone  60 mg Oral Daily  . nicotine  21 mg Transdermal Daily  . nystatin cream   Topical BID  . pantoprazole  40 mg Oral Daily  . senna-docusate  1 tablet Oral BID  . sodium chloride flush  10-40 mL Intracatheter Q12H  . tamsulosin  0.4 mg Oral Daily   Continuous Infusions: .  ceFAZolin (ANCEF) Kelly 2 g (12/29/17 1537)  . lactated ringers Stopped (12/26/17 0508)  . methocarbamol (ROBAXIN) Kelly 500 mg (12/29/17 1056)     LOS: 27 days    Time spent: over 65 min   Fayrene Helper, MD [facility] Pager [phone]  If 7PM-7AM, please contact night-coverage [email or web address] Password [facility] 12/29/2017, 6:54 PM

## 2017-12-30 LAB — PREGNANCY, URINE: Preg Test, Ur: NEGATIVE

## 2017-12-30 MED ORDER — PREDNISONE 5 MG PO TABS
5.0000 mg | ORAL_TABLET | Freq: Every day | ORAL | Status: DC
  Administered 2017-12-31 – 2018-01-23 (×24): 5 mg via ORAL
  Filled 2017-12-30 (×24): qty 1

## 2017-12-30 MED ORDER — HYDROMORPHONE HCL 1 MG/ML IJ SOLN
2.0000 mg | Freq: Once | INTRAMUSCULAR | Status: AC | PRN
  Administered 2017-12-31: 2 mg via INTRAVENOUS
  Filled 2017-12-30: qty 2

## 2017-12-30 NOTE — Progress Notes (Signed)
Physical Therapy Treatment Patient Details Name: Kelly [name] MRN: [id number] DOB: 08-15-1985 Today's Date: 12/30/2017    History of Present Illness Kelly [name] is a 32 y.o. F with sepsis, tachycardia, tachypnea, leukocytosis, hypotension.  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 PMH of polysubstance abuse, ADD, scoliosis sp back surgery (20 years ago per mom). PICC placed 12/08/17.    PT Comments    Pt continues with steady progress. Min guard assist for sit to stand and ambulation 100 feet and 50 feet with RW. No LE buckling noted. Bilat LE pain limiting factor for gait distance. Pt needing reassurance that she is doing well and progressing. Encouraged pt to ambulate in hall with nursing.    Follow Up Recommendations  CIR     Equipment Recommendations  Rolling walker with 5" wheels;3in1 (PT)    Recommendations for Other Services       Precautions / Restrictions Precautions Precautions: Back;Fall Precaution Comments: reviewed 3/3 back precautions    Mobility  Bed Mobility Overal bed mobility: Needs Assistance   Rolling: Modified independent (Device/Increase time) Sidelying to sit: Supervision     Sit to sidelying: Min assist General bed mobility comments: cues for logroll, assist with LLE into bed  Transfers Overall transfer level: Needs assistance Equipment used: Rolling walker (2 wheeled) Transfers: Sit to/from Stand Sit to Stand: Min guard         General transfer comment: pt demo safe technique, min guard assist for safety  Ambulation/Gait Ambulation/Gait assistance: Min guard Gait Distance (Feet): 110 Feet Assistive device: Rolling walker (2 wheeled) Gait Pattern/deviations: Step-through pattern;Decreased stride length Gait velocity: Decreased Gait velocity interpretation: <1.31 ft/sec, indicative of household ambulator General Gait Details: Pt ambulated 100  feet, seated rest break in room, then ambulated 50 feet. Cues to relax shoulders.   Stairs             Wheelchair Mobility    Modified Rankin (Stroke Patients Only)       Balance Overall balance assessment: Needs assistance Sitting-balance support: Feet supported;No upper extremity supported Sitting balance-Leahy Scale: Good     Standing balance support: During functional activity;Bilateral upper extremity supported Standing balance-Leahy Scale: Fair                              Cognition Arousal/Alertness: Awake/alert Behavior During Therapy: WFL for tasks assessed/performed Overall Cognitive Status: Within Functional Limits for tasks assessed                                        Exercises General Exercises - Lower Extremity Ankle Circles/Pumps: AROM;Both;15 reps    General Comments        Pertinent Vitals/Pain Pain Assessment: 0-10 Pain Score: 7  Pain Location: L side of back Pain Descriptors / Indicators: Spasm Pain Intervention(s): Monitored during session;Patient requesting pain meds-RN notified    Home Living                      Prior Function            PT Goals (current goals can now be found in the care plan section) Acute Rehab PT Goals Patient Stated Goal: decrease pain and walk better PT Goal Formulation: With patient Time For Goal Achievement: 01/03/18 Potential to Achieve  Goals: Good Progress towards PT goals: Progressing toward goals    Frequency    Min 3X/week      PT Plan Current plan remains appropriate    Co-evaluation              AM-PAC PT "6 Clicks" Daily Activity  Outcome Measure  Difficulty turning over in bed (including adjusting bedclothes, sheets and blankets)?: A Little Difficulty moving from lying on back to sitting on the side of the bed? : A Little Difficulty sitting down on and standing up from a chair with arms (e.g., wheelchair, bedside commode, etc,.)?: A  Lot Help needed moving to and from a bed to chair (including a wheelchair)?: A Little Help needed walking in hospital room?: A Little Help needed climbing 3-5 steps with a railing? : A Lot 6 Click Score: 16    End of Session Equipment Utilized During Treatment: Gait belt Activity Tolerance: Patient tolerated treatment well Patient left: with call bell/phone within reach;in bed Nurse Communication: Mobility status PT Visit Diagnosis: Unsteadiness on feet (R26.81);Other abnormalities of gait and mobility (R26.89);Muscle weakness (generalized) (M62.81);Difficulty in walking, not elsewhere classified (R26.2);Pain;Other symptoms and signs involving the nervous system (R29.898)     Time: 2549-8264 PT Time Calculation (min) (ACUTE ONLY): 28 min  Charges:  $Gait Training: 23-37 mins                     Lorrin Goodell, [place]  Office # [phone] Pager [place], [place] 12/30/2017, 11:12 AM

## 2017-12-30 NOTE — Progress Notes (Signed)
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 Patient also complained of back pain. MRI of the lumbar spine suggested epidural abscess. Patient was transferred to [facility] and underwent lumbar laminectomy for drainage of the epidural abscess.  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  Infectious disease was consulted.   Patient remains on Cefazolin.  RPR nonreactive. HIV nonreactive. Hepatitis C RNA by PCR negative.  Hepatitis A and B neg.  Echocardiogram suggests severe tricuspid regurgitation.  Cardiology was consulted. They recommend completing treatment with IV antibiotics and repeat echocardiogram afterwards to determine if she will need further intervention. No plans for TEE currently. Per ID she will need 8 weeks of IV cefazolin through September 18. And may need oral treatment subsequently.    Lumbar epidural abscess She is status post lumbar laminectomy and drainage of abscess on 7/25.   Patient developed urinary retention and required Foley catheterization, but foley now removed.   She is experiencing some  stool incontinence as well.   Has had some LLE weakness since that time Does not appear to have any new neurological deficits.   Foley catheter was removed on 8/9.  Continue flomax MRI L spine/pelvis done on 8/14 notable for persistent discitis and possible epidural soft tissue thickening concerning for phlegmon.  No drainable abscess.  Discussed findings with neurosurgery and no findings that need intervention.  Left back and flank pain UA with WBC's, negative nitrite, mod LE (in setting of recent foley placement).  Small RBC, 0-5.  Repeat imaging as noted above, follow  Discussed pretreating for PT Notes some spasms in her legs today as well as her low back, follow and f/u lytes tomorrow  Adrenal Insufficiency: Pt with hyperkalemia and hyponatremia AM cortisol 3.4 ACTH stim test 2.7 -> 12.8 -> 15.5 at 60 minutes ACTH within normal range, inappropriately normal Suggests secondary adrenal insuffiency Discussed with endocrine 8/19 who recommended steroids, f/u outpatient Will obtain MRI brain to eval pituitary  Chronic opiates can cause adrenal insufficiency  History of scoliosis status post previous surgery/chronic back pain Patient mentioned that she takes methadone prescribed by an outpatient clinic. [name] prescriber database was queried and did not show any prescriptions for methadone. [facility] in [place] was called by previous provider. Apparently patient was being given 80 mg of methadone at the clinic (no prescription) starting in April and last dose was on October 25, 2017. But patient has not returned to the clinic since that and so she was discharged. And since no prescription was written, this information apparently does not show up on the [place] prescribers database.  Patient was started on methadone here in the hospital. Currently on  60 mg.  Her pain seems to be better controlled.  She did mention that the patient's mother has contacted the clinic and they  have agreed to take her back once she is discharged from the hospital.  So we will leave her on current dose of methadone.  Patient was told that we will be unable to prescribe this medication at discharge.  Mild acute renal failure with hyponatremia Renal function is normal.  She is noted to have mild hyponatremia which has been stable.  Follow.    Polysubstance abuse Will need ongoing counseling and outpatient rehab.  Patient to follow-up with the rehab clinic post discharge.  They will take up the responsibility of prescribing methadone.  Vaginal candidiasis Continue topical agents.  Urinary retention  Bowel incontinence Per neurosurgery her bowel and bladder issues will eventually resolve but nothing to be done for it right now. Improving slowly.  Still with some incontinence of stool.   Foley catheter was discontinued on 8/9.  Is able to urinate on own.  Continue Flomax.  Bladder scans as needed.  Stable.  Normocytic anemia stable, follow   Anasarca Improved, stable.  DVT prophylaxis: heparin Code Status: full  Family Communication: none at bedside Disposition Plan: pending   Consultants:   ID  Neurosurgery  Cardiology  Procedures:  Study Conclusions  - Left ventricle: The cavity size was normal. Systolic function was   normal. The estimated ejection fraction was in the range of 55%   to 60%. Wall motion was normal; there were no regional wall   motion abnormalities. Left ventricular diastolic function   parameters were normal. - Aortic valve: Transvalvular velocity was within the normal range.   There was no stenosis. There was no regurgitation. - Mitral valve: Thickening of the anterior leaflet. Transvalvular   velocity was within the normal range. There was no evidence for   stenosis. There was trivial regurgitation. - Left atrium: The atrium was mildly dilated. - Right ventricle: The cavity size was normal. Wall thickness was   normal. Systolic  function was normal. - Right atrium: The atrium was severely dilated. - Tricuspid valve: There was a medium-sized, 1.0 cm (W) x 1.7 cm   (L) vegetation on the septal leaflet. There was severe   regurgitation. - Pulmonary arteries: Systolic pressure was mildly increased. PA   peak pressure: 42 mm Hg (S).  Impressions:  - Vegetation noted on the septal leaflet of the tricuspid valve   consistent with endocarditis. Cannot exlude vegetation on the   anterior leaflet of the mitral valve. Suggest TEE to better   assess.  Antimicrobials:  Anti-infectives (From admission, onward)   Start     Dose/Rate Route Frequency Ordered Stop   12/06/17 2200  ceFAZolin (ANCEF) IVPB 2g/100 mL premix     2 g 200 mL/hr over 30 Minutes Intravenous Every 8 hours 12/06/17 1016 01/29/18 2359   12/06/17 0600  vancomycin (VANCOCIN) IVPB 1000 mg/200 mL premix  Status:  Discontinued     1,000 mg 200 mL/hr over 60 Minutes Intravenous Every 8 hours 12/05/17 2316 12/06/17 1200   12/06/17 0000  cefTRIAXone (ROCEPHIN) 2 g in sodium chloride 0.9 % 100 mL IVPB  Status:  Discontinued     2 g 200 mL/hr over 30 Minutes Intravenous Every 24 hours 12/05/17 2238 12/06/17 1016   12/05/17 2300  vancomycin (VANCOCIN) 1,750 mg in sodium chloride 0.9 % 500 mL IVPB     1,750 mg 250 mL/hr over 120 Minutes Intravenous  Once  12/05/17 2246 12/06/17 0228   12/05/17 2055  vancomycin (VANCOCIN) powder  Status:  Discontinued       As needed 12/05/17 2055 12/05/17 2118   12/05/17 2001  bacitracin 50,000 Units in sodium chloride 0.9 % 500 mL irrigation  Status:  Discontinued       As needed 12/05/17 2002 12/05/17 2118   12/03/17 1600  azithromycin (ZITHROMAX) 500 mg in sodium chloride 0.9 % 250 mL IVPB  Status:  Discontinued     500 mg 250 mL/hr over 60 Minutes Intravenous Every 24 hours 12/02/17 1706 12/03/17 0948   12/03/17 1400  ceFAZolin (ANCEF) IVPB 2g/100 mL premix  Status:  Discontinued     2 g 200 mL/hr over 30 Minutes  Intravenous Every 8 hours 12/03/17 0948 12/05/17 2238   12/03/17 1000  vancomycin (VANCOCIN) IVPB 750 mg/150 ml premix  Status:  Discontinued     750 mg 150 mL/hr over 60 Minutes Intravenous Every 12 hours 12/02/17 1710 12/03/17 0948   12/02/17 2200  piperacillin-tazobactam (ZOSYN) IVPB 3.375 g  Status:  Discontinued     3.375 g 12.5 mL/hr over 240 Minutes Intravenous Every 8 hours 12/02/17 1710 12/03/17 0948   12/02/17 1800  vancomycin (VANCOCIN) 1,500 mg in sodium chloride 0.9 % 500 mL IVPB     1,500 mg 250 mL/hr over 120 Minutes Intravenous  Once 12/02/17 1710 12/02/17 2110   12/02/17 1530  cefTRIAXone (ROCEPHIN) 1 g in sodium chloride 0.9 % 100 mL IVPB     1 g 200 mL/hr over 30 Minutes Intravenous  Once 12/02/17 1522 12/02/17 1626   12/02/17 1530  azithromycin (ZITHROMAX) 500 mg in sodium chloride 0.9 % 250 mL IVPB     500 mg 250 mL/hr over 60 Minutes Intravenous  Once 12/02/17 1522 12/02/17 1759     Subjective: Doing ok. Some leg pain  Objective: Vitals:   12/29/17 1300 12/29/17 2008 12/30/17 0400 12/30/17 0500  BP: 99/69 (!) 103/58 96/69   Pulse: 85 86 82   Resp: 18 18    Temp: 98.7 F (37.1 C) 97.7 F (36.5 C) 97.8 F (36.6 C)   TempSrc: Oral Oral Oral   SpO2: 96% 98%    Weight:    77.2 kg  Height:        Intake/Output Summary (Last 24 hours) at 12/30/2017 1405 Last data filed at 12/30/2017 0600 Gross per 24 hour  Intake 692 ml  Output -  Net 692 ml   Filed Weights   12/28/17 0459 12/29/17 0453 12/30/17 0500  Weight: 77.7 kg 77.1 kg 77.2 kg    Examination:  General: No acute distress. Cardiovascular: Heart sounds show a regular rate, and rhythm.  Lungs: Clear to auscultation bilaterally with good air movement.  Abdomen: Soft, nontender, nondistended  Neurological: Alert and oriented 3. Moves all extremities 4. Cranial nerves II through XII grossly intact. Skin: Warm and dry. No rashes or lesions. Extremities: No clubbing or cyanosis. No edema.    Psychiatric: Mood and affect are normal. Insight and judgment are appropriate.   Data Reviewed: I have personally reviewed following labs and imaging studies  CBC: Recent Labs  Lab 12/24/17 0520 12/25/17 0410 12/27/17 0432  WBC 8.2 8.0 7.6  HGB 8.5* 8.5* 8.5*  HCT 26.1* 28.0* 28.1*  MCV 87.9 89.7 89.2  PLT 578* 626* 616*   Basic Metabolic Panel: Recent Labs  Lab 12/24/17 0520 12/25/17 0410 12/27/17 0432  NA 134* 133* 134*  K 5.2* 4.5 4.4  CL 94* 96*  97*  CO2 29 26 30   GLUCOSE 103* 127* 104*  BUN 20 18 20   CREATININE 0.73 0.66 0.62  CALCIUM 9.8 9.9 9.6  MG 1.9 1.9 1.8   GFR: Estimated Creatinine Clearance: 107.1 mL/min (by C-G formula based on SCr of 0.62 mg/dL). Liver Function Tests: No results for input(s): AST, ALT, ALKPHOS, BILITOT, PROT, ALBUMIN in the last 168 hours. No results for input(s): LIPASE, AMYLASE in the last 168 hours. No results for input(s): AMMONIA in the last 168 hours. Coagulation Profile: No results for input(s): INR, PROTIME in the last 168 hours. Cardiac Enzymes: No results for input(s): CKTOTAL, CKMB, CKMBINDEX, TROPONINI in the last 168 hours. BNP (last 3 results) No results for input(s): PROBNP in the last 8760 hours. HbA1C: No results for input(s): HGBA1C in the last 72 hours. CBG: No results for input(s): GLUCAP in the last 168 hours. Lipid Profile: No results for input(s): CHOL, HDL, LDLCALC, TRIG, CHOLHDL, LDLDIRECT in the last 72 hours. Thyroid Function Tests: No results for input(s): TSH, T4TOTAL, FREET4, T3FREE, THYROIDAB in the last 72 hours. Anemia Panel: No results for input(s): VITAMINB12, FOLATE, FERRITIN, TIBC, IRON, RETICCTPCT in the last 72 hours. Sepsis Labs: No results for input(s): PROCALCITON, LATICACIDVEN in the last 168 hours.  No results found for this or any previous visit (from the past 240 hour(s)).       Radiology Studies: No results found.      Scheduled Meds: . acetaminophen  1,000 mg Oral  Q8H  . bupivacaine liposome  20 mL Infiltration Once  . bupivacaine-EPINEPHrine  50 mL Infiltration Once  . Chlorhexidine Gluconate Cloth  6 each Topical Q2200  . cyclobenzaprine  10 mg Oral TID  . diclofenac sodium  2 g Topical QID  . docusate sodium  100 mg Oral BID  . fluticasone  2 spray Each Nare Daily  . guaiFENesin  1,200 mg Oral BID  . heparin  5,000 Units Subcutaneous Q8H  . lactobacillus  1 g Oral TID WC  . lidocaine  1 patch Transdermal Q24H  . magnesium oxide  400 mg Oral BID  . methadone  60 mg Oral Daily  . nicotine  21 mg Transdermal Daily  . nystatin cream   Topical BID  . pantoprazole  40 mg Oral Daily  . senna-docusate  1 tablet Oral BID  . sodium chloride flush  10-40 mL Intracatheter Q12H  . tamsulosin  0.4 mg Oral Daily   Continuous Infusions: .  ceFAZolin (ANCEF) IV 2 g (12/30/17 0525)  . lactated ringers Stopped (12/26/17 0508)  . methocarbamol (ROBAXIN) IV 500 mg (12/29/17 2224)     LOS: 28 days    Time spent: over 19 min   Fayrene Helper, MD [facility] Pager [phone]  If 7PM-7AM, please contact night-coverage [email or web address] Password [email or web address] 12/30/2017, 2:05 PM

## 2017-12-31 ENCOUNTER — Inpatient Hospital Stay (HOSPITAL_COMMUNITY): Payer: Medicaid Other

## 2017-12-31 LAB — BASIC METABOLIC PANEL
Anion gap: 9 (ref 5–15)
BUN: 19 mg/dL (ref 6–20)
CALCIUM: 9.9 mg/dL (ref 8.9–10.3)
CO2: 28 mmol/L (ref 22–32)
CREATININE: 0.62 mg/dL (ref 0.44–1.00)
Chloride: 99 mmol/L (ref 98–111)
GFR calc Af Amer: >60 mL/min (ref >60)
GFR calc non Af Amer: >60 mL/min (ref >60)
GLUCOSE: 109 mg/dL — AB (ref 70–99)
Potassium: 4.4 mmol/L (ref 3.5–5.1)
Sodium: 136 mmol/L (ref 135–145)

## 2017-12-31 LAB — CBC
HEMATOCRIT: 29.7 % — AB (ref 36.0–46.0)
Hemoglobin: 8.9 g/dL — ABNORMAL LOW (ref 12.0–15.0)
MCH: 27.1 pg (ref 26.0–34.0)
MCHC: 30 g/dL (ref 30.0–36.0)
MCV: 90.3 fL (ref 78.0–100.0)
Platelets: 491 10*3/uL — ABNORMAL HIGH (ref 150–400)
RBC: 3.29 MIL/uL — ABNORMAL LOW (ref 3.87–5.11)
RDW: 15.5 % (ref 11.5–15.5)
WBC: 8.9 10*3/uL (ref 4.0–10.5)

## 2017-12-31 LAB — MAGNESIUM: Magnesium: 1.6 mg/dL — ABNORMAL LOW (ref 1.7–2.4)

## 2017-12-31 MED ORDER — GADOBENATE DIMEGLUMINE 529 MG/ML IV SOLN
10.0000 mL | Freq: Once | INTRAVENOUS | Status: AC
  Administered 2017-12-31: 8 mL via INTRAVENOUS

## 2017-12-31 NOTE — Progress Notes (Signed)
Occupational Therapy Treatment Patient Details Name: Kelly [name] MRN: [id number] DOB: 1985-07-17 Today's Date: 12/31/2017    History of present illness Kelly [name] is a 31 y.o. F with sepsis, tachycardia, tachypnea, leukocytosis, hypotension.  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 PMH of polysubstance abuse, ADD, scoliosis sp back surgery (20 years ago per mom). PICC placed 12/08/17.   OT comments  Pt presents supine in bed pleasant and willing to work with therapy. Pt completing functional mobility using RW in room and short distance in hallway with minguard-minA. Reviewed use of AE for completion of LB ADLs, pt with good recall of technique for using AE though continues to require overall modA for LB ADLs and with increased difficulty managing LLE during task completion. Pt completing toileting with overall modA, standing grooming ADL with minA for standing balance. Pt continues to require support of single UE for static standing balance, will continue to practice ability to maintain standing balance without UE support. Feel POC remains appropriate at this time. Will continue to follow acutely to progress pt towards established OT goals.   Follow Up Recommendations  CIR;Supervision/Assistance - 24 hour    Equipment Recommendations  3 in 1 bedside commode;Wheelchair (measurements OT);Wheelchair cushion (measurements OT)          Precautions / Restrictions Precautions Precautions: Back;Fall Precaution Comments: reviewed 3/3 back precautions, pt able to recall 2/3 without assist  Restrictions Weight Bearing Restrictions: No       Mobility Bed Mobility Overal bed mobility: Needs Assistance Bed Mobility: Rolling;Sidelying to Sit;Sit to Sidelying Rolling: Modified independent (Device/Increase time) Sidelying to sit: Supervision     Sit to sidelying: Min assist General bed mobility  comments: cues for logroll, assist with LLE into bed  Transfers Overall transfer level: Needs assistance Equipment used: Rolling walker (2 wheeled) Transfers: Sit to/from Stand Sit to Stand: Min guard;Min assist         General transfer comment: VCs safe hand placement, min guard assist for safety from EOB and minA to rise from toilet     Balance Overall balance assessment: Needs assistance Sitting-balance support: Feet supported;No upper extremity supported Sitting balance-Leahy Scale: Good     Standing balance support: During functional activity;Bilateral upper extremity supported Standing balance-Leahy Scale: Fair                             ADL either performed or assessed with clinical judgement   ADL Overall ADL's : Needs assistance/impaired     Grooming: Minimal assistance;Standing;Wash/dry hands Grooming Details (indicate cue type and reason): pt alternating between single UE support on sink during task as she reports increased fear of losing balance without UE support              Lower Body Dressing: Moderate assistance;With adaptive equipment;Sit to/from stand Lower Body Dressing Details (indicate cue type and reason): pt with good recall of use of sock aide and shoe horn for donning socks/shoes; continues to require assist for full task completion especially for use of AE to don footwear on LLE  Toilet Transfer: Minimal assistance;Ambulation;Grab bars;RW   Toileting- Clothing Manipulation and Hygiene: Sitting/lateral lean;Sit to/from stand;Moderate assistance Toileting - Clothing Manipulation Details (indicate cue type and reason): lateral lean for hygiene and some assist for thoroughness, pt able to complete brief management with light minA to fully advance over hips and min steadying assist while pt  completed task, min cues to maintain back precautions     Functional mobility during ADLs: Rolling walker;Min guard;Minimal assistance General ADL  Comments: intermittent cues for safety and maintaining back precautions during session; pt completing LB ADLs, toileting, and then further mobility into hallway; pt reports increased fear of losing balance without UE support during functional tasks, at end of session practiced static standing at EOB while releasing hands from RW with pt able to do so for approx 5 sec each time, close min guard for safety      Vision       Perception     Praxis      Cognition Arousal/Alertness: Awake/alert Behavior During Therapy: WFL for tasks assessed/performed Overall Cognitive Status: Within Functional Limits for tasks assessed                                          Exercises     Shoulder Instructions       General Comments      Pertinent Vitals/ Pain       Pain Assessment: Faces Faces Pain Scale: Hurts little more Pain Location: L side of back Pain Descriptors / Indicators: Spasm Pain Intervention(s): Monitored during session;Repositioned;Limited activity within patient's tolerance  Home Living                                          Prior Functioning/Environment              Frequency  Min 2X/week        Progress Toward Goals  OT Goals(current goals can now be found in the care plan section)  Progress towards OT goals: Progressing toward goals  Acute Rehab OT Goals Patient Stated Goal: decrease pain and walk better OT Goal Formulation: With patient Time For Goal Achievement: 01/07/18 Potential to Achieve Goals: Good  Plan Frequency remains appropriate;Discharge plan remains appropriate    Co-evaluation                 AM-PAC PT "6 Clicks" Daily Activity     Outcome Measure   Help from another person eating meals?: None Help from another person taking care of personal grooming?: A Little Help from another person toileting, which includes using toliet, bedpan, or urinal?: A Lot Help from another person bathing  (including washing, rinsing, drying)?: A Lot Help from another person to put on and taking off regular upper body clothing?: A Little Help from another person to put on and taking off regular lower body clothing?: A Lot 6 Click Score: 16    End of Session Equipment Utilized During Treatment: Gait belt;Rolling walker  OT Visit Diagnosis: Unsteadiness on feet (R26.81);Other abnormalities of gait and mobility (R26.89);Pain Pain - Right/Left: Left Pain - part of body: (back )   Activity Tolerance Patient tolerated treatment well   Patient Left in bed;with call bell/phone within reach;with nursing/sitter in room   Nurse Communication Mobility status        Time: 3491-7915 OT Time Calculation (min): 38 min  Charges: OT General Charges $OT Visit: 1 Visit OT Treatments $Self Care/Home Management : 23-37 mins  Lou Cal, OT Pager [phone] 12/31/2017    Raymondo Band 12/31/2017, 4:10 PM

## 2017-12-31 NOTE — Plan of Care (Signed)

## 2017-12-31 NOTE — Progress Notes (Signed)
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 Patient also complained of back pain. MRI of the lumbar spine suggested epidural abscess. Patient was transferred to [facility] and underwent lumbar laminectomy for drainage of the epidural abscess.  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  Infectious disease was consulted.   Patient remains on Cefazolin.  RPR nonreactive. HIV nonreactive. Hepatitis C RNA by PCR negative.  Hepatitis Kelly [name] and B neg.  Echocardiogram suggests severe tricuspid regurgitation.  Cardiology was consulted. They recommend completing treatment with IV antibiotics and repeat echocardiogram afterwards to determine if she will need further intervention. No plans for TEE currently. Per ID she will need 8 weeks of IV cefazolin through September 18. And may need oral treatment subsequently.    Lumbar epidural abscess She is status post lumbar laminectomy and drainage of abscess on 7/25.   Patient developed urinary retention and required Foley catheterization, but foley now removed.   She is experiencing some  stool incontinence as well.   Has had some LLE weakness since that time Does not appear to have any new neurological deficits.   Foley catheter was removed on 8/9.  Continue flomax MRI L spine/pelvis done on 8/14 due to persistent low back pain (noted below) notable for persistent discitis and possible epidural soft tissue thickening concerning for phlegmon.  No drainable abscess.  Discussed findings with neurosurgery and no findings that need intervention.  Left back and flank pain Repeat imaging as noted above, follow  Discussed pretreating for PT  Adrenal Insufficiency  Possible Microadenoma: Pt with hyperkalemia and hyponatremia AM cortisol 3.4 ACTH stim test 2.7 -> 12.8 -> 15.5 at 60 minutes ACTH within normal range, inappropriately normal Suggests secondary adrenal insuffiency Discussed with endocrine 8/19 who recommended steroids, f/u outpatient MRI brain with posisble 1 mm microadenoma -> will check prolactin.  Follow up outpatient. Of note, chronic opiates can cause secondary adrenal insufficiency and   Concern for broken needles in R arm:  She notes they've been present for 6 months to greater than 1 year. Follow plain films, discuss with ortho prn  History of scoliosis status post previous surgery/chronic back pain Patient mentioned that she takes methadone prescribed by an outpatient clinic. [facility] prescriber database was queried by previous provider and did not show any prescriptions for methadone. [facility] in [place] was called by previous provider. Apparently patient was being given 80 mg of methadone at the clinic (no prescription) starting in April and last dose was on October 25, 2017. But patient has not returned to the clinic since that and so she was discharged. And since no prescription was written, this information apparently does not  show up on the [place] prescribers database.  Patient was started on methadone here in the hospital. Currently on 60  mg.  Her pain seems to be better controlled.  She did mention that the patient's mother has contacted the clinic and they have agreed to take her back once she is discharged from the hospital.  So we will leave her on current dose of methadone.  Patient was told that we will be unable to prescribe this medication at discharge.  Mild acute renal failure with hyponatremia Renal function is normal.  She is noted to have mild hyponatremia which has been stable.  Follow.    Polysubstance abuse Will need ongoing counseling and outpatient rehab.  Patient to follow-up with the rehab clinic post discharge.  They will take up the responsibility of prescribing methadone.  Vaginal candidiasis Continue topical agents.  Urinary retention  Bowel incontinence Per neurosurgery her bowel and bladder issues will eventually resolve but nothing to be done for it right now. Improving slowly.  Still with some incontinence of stool.   Foley catheter was discontinued on 8/9.  Is able to urinate on own.  Continue Flomax.  Bladder scans as needed.  Stable.  Normocytic anemia stable, follow   Anasarca Improved, stable.  DVT prophylaxis: heparin Code Status: full  Family Communication: aunt at bedside Disposition Plan: pending   Consultants:   ID  Neurosurgery  Cardiology  Procedures:  Study Conclusions  - Left ventricle: The cavity size was normal. Systolic function was   normal. The estimated ejection fraction was in the range of 55%   to 60%. Wall motion was normal; there were no regional wall   motion abnormalities. Left ventricular diastolic function   parameters were normal. - Aortic valve: Transvalvular velocity was within the normal range.   There was no stenosis. There was no regurgitation. - Mitral valve: Thickening of the anterior leaflet. Transvalvular   velocity was within the normal range. There was no evidence for   stenosis. There was trivial regurgitation. - Left atrium:  The atrium was mildly dilated. - Right ventricle: The cavity size was normal. Wall thickness was   normal. Systolic function was normal. - Right atrium: The atrium was severely dilated. - Tricuspid valve: There was Kelly [name] medium-sized, 1.0 cm (W) x 1.7 cm   (L) vegetation on the septal leaflet. There was severe   regurgitation. - Pulmonary arteries: Systolic pressure was mildly increased. PA   peak pressure: 42 mm Hg (S).  Impressions:  - Vegetation noted on the septal leaflet of the tricuspid valve   consistent with endocarditis. Cannot exlude vegetation on the   anterior leaflet of the mitral valve. Suggest TEE to better   assess.  Antimicrobials:  Anti-infectives (From admission, onward)   Start     Dose/Rate Route Frequency Ordered Stop   12/06/17 2200  ceFAZolin (ANCEF) IVPB 2g/100 mL premix     2 g 200 mL/hr over 30 Minutes Intravenous Every 8 hours 12/06/17 1016 01/29/18 2359   12/06/17 0600  vancomycin (VANCOCIN) IVPB 1000 mg/200 mL premix  Status:  Discontinued     1,000 mg 200 mL/hr over 60 Minutes Intravenous Every 8 hours 12/05/17 2316 12/06/17 1200   12/06/17 0000  cefTRIAXone (ROCEPHIN) 2 g in sodium chloride 0.9 % 100 mL IVPB  Status:  Discontinued     2 g 200 mL/hr over 30 Minutes Intravenous Every 24 hours 12/05/17 2238 12/06/17 1016   12/05/17 2300  vancomycin (VANCOCIN) 1,750 mg in sodium chloride  0.9 % 500 mL IVPB     1,750 mg 250 mL/hr over 120 Minutes Intravenous  Once 12/05/17 2246 12/06/17 0228   12/05/17 2055  vancomycin (VANCOCIN) powder  Status:  Discontinued       As needed 12/05/17 2055 12/05/17 2118   12/05/17 2001  bacitracin 50,000 Units in sodium chloride 0.9 % 500 mL irrigation  Status:  Discontinued       As needed 12/05/17 2002 12/05/17 2118   12/03/17 1600  azithromycin (ZITHROMAX) 500 mg in sodium chloride 0.9 % 250 mL IVPB  Status:  Discontinued     500 mg 250 mL/hr over 60 Minutes Intravenous Every 24 hours 12/02/17 1706 12/03/17 0948    12/03/17 1400  ceFAZolin (ANCEF) IVPB 2g/100 mL premix  Status:  Discontinued     2 g 200 mL/hr over 30 Minutes Intravenous Every 8 hours 12/03/17 0948 12/05/17 2238   12/03/17 1000  vancomycin (VANCOCIN) IVPB 750 mg/150 ml premix  Status:  Discontinued     750 mg 150 mL/hr over 60 Minutes Intravenous Every 12 hours 12/02/17 1710 12/03/17 0948   12/02/17 2200  piperacillin-tazobactam (ZOSYN) IVPB 3.375 g  Status:  Discontinued     3.375 g 12.5 mL/hr over 240 Minutes Intravenous Every 8 hours 12/02/17 1710 12/03/17 0948   12/02/17 1800  vancomycin (VANCOCIN) 1,500 mg in sodium chloride 0.9 % 500 mL IVPB     1,500 mg 250 mL/hr over 120 Minutes Intravenous  Once 12/02/17 1710 12/02/17 2110   12/02/17 1530  cefTRIAXone (ROCEPHIN) 1 g in sodium chloride 0.9 % 100 mL IVPB     1 g 200 mL/hr over 30 Minutes Intravenous  Once 12/02/17 1522 12/02/17 1626   12/02/17 1530  azithromycin (ZITHROMAX) 500 mg in sodium chloride 0.9 % 250 mL IVPB     500 mg 250 mL/hr over 60 Minutes Intravenous  Once 12/02/17 1522 12/02/17 1759     Subjective: Doing ok today. Some back pain.   Objective: Vitals:   12/30/17 1510 12/30/17 2206 12/31/17 0515 12/31/17 1439  BP: 103/77 110/72 100/73 102/72  Pulse: 86 (!) 102 69 80  Resp:   18 18  Temp: 97.9 F (36.6 C) (!) 97.5 F (36.4 C) 97.8 F (36.6 C) (!) 97.3 F (36.3 C)  TempSrc: Oral Oral Oral Oral  SpO2: 96% 99% 99% 98%  Weight:      Height:        Intake/Output Summary (Last 24 hours) at 12/31/2017 1937 Last data filed at 12/31/2017 1440 Gross per 24 hour  Intake 320 ml  Output -  Net 320 ml   Filed Weights   12/28/17 0459 12/29/17 0453 12/30/17 0500  Weight: 77.7 kg 77.1 kg 77.2 kg    Examination:  General: No acute distress.  Sitting up in bed. Cardiovascular: Heart sounds show Kelly [name] regular rate, and rhythm. No gallops or rubs. No murmurs. No JVD. Lungs: Clear to auscultation bilaterally with good air movement. No rales, rhonchi or  wheezes. Abdomen: Soft, nontender, nondistended  Neurological: Alert and oriented 3. Moves all extremities 4 . Cranial nerves II through XII grossly intact. Skin: Warm and dry. No rashes or lesions. Extremities: No clubbing or cyanosis. No edema.  Psychiatric: Mood and affect are normal. Insight and judgment are appropriate.    Data Reviewed: I have personally reviewed following labs and imaging studies  CBC: Recent Labs  Lab 12/25/17 0410 12/27/17 0432 12/31/17 0309  WBC 8.0 7.6 8.9  HGB 8.5* 8.5* 8.9*  HCT  28.0* 28.1* 29.7*  MCV 89.7 89.2 90.3  PLT 626* 613* 740*   Basic Metabolic Panel: Recent Labs  Lab 12/25/17 0410 12/27/17 0432 12/31/17 0309  NA 133* 134* 136  K 4.5 4.4 4.4  CL 96* 97* 99  CO2 26 30 28   GLUCOSE 127* 104* 109*  BUN 18 20 19   CREATININE 0.66 0.62 0.62  CALCIUM 9.9 9.6 9.9  MG 1.9 1.8 1.6*   GFR: Estimated Creatinine Clearance: 107.1 mL/min (by C-G formula based on SCr of 0.62 mg/dL). Liver Function Tests: No results for input(s): AST, ALT, ALKPHOS, BILITOT, PROT, ALBUMIN in the last 168 hours. No results for input(s): LIPASE, AMYLASE in the last 168 hours. No results for input(s): AMMONIA in the last 168 hours. Coagulation Profile: No results for input(s): INR, PROTIME in the last 168 hours. Cardiac Enzymes: No results for input(s): CKTOTAL, CKMB, CKMBINDEX, TROPONINI in the last 168 hours. BNP (last 3 results) No results for input(s): PROBNP in the last 8760 hours. HbA1C: No results for input(s): HGBA1C in the last 72 hours. CBG: No results for input(s): GLUCAP in the last 168 hours. Lipid Profile: No results for input(s): CHOL, HDL, LDLCALC, TRIG, CHOLHDL, LDLDIRECT in the last 72 hours. Thyroid Function Tests: No results for input(s): TSH, T4TOTAL, FREET4, T3FREE, THYROIDAB in the last 72 hours. Anemia Panel: No results for input(s): VITAMINB12, FOLATE, FERRITIN, TIBC, IRON, RETICCTPCT in the last 72 hours. Sepsis Labs: No  results for input(s): PROCALCITON, LATICACIDVEN in the last 168 hours.  No results found for this or any previous visit (from the past 240 hour(s)).       Radiology Studies: Mr Jeri Cos CX Contrast  Result Date: 12/31/2017 CLINICAL DATA:  Adrenal insufficiency. EXAM: MRI HEAD WITHOUT AND WITH CONTRAST TECHNIQUE: Multiplanar, multiecho pulse sequences of the brain and surrounding structures were obtained without and with intravenous contrast. CONTRAST:  12m MULTIHANCE GADOBENATE DIMEGLUMINE 529 MG/ML IV SOLN COMPARISON:  Head CT 07/31/2008 FINDINGS: Brain: There is no evidence of acute infarct, intracranial hemorrhage, mass, midline shift, or extra-axial fluid collection. The ventricles and sulci are normal. The brain is normal in signal. Dedicated pituitary protocol imaging was performed. The pituitary gland is overall within normal limits in size, although the right aspect of the gland is slightly larger than the left with mild depression of the sellar floor on the right. Kelly [name] 1 mm hypoenhancing focus is noted in the right aspect of the gland on dynamic post-contrast imaging (series 20, image 3). Pituitary enhancement is otherwise homogeneous. The infundibulum is midline. The cavernous sinuses and optic chiasm are unremarkable. Vascular: Major intracranial vascular flow voids are preserved. Skull and upper cervical spine: Unremarkable bone marrow signal. Sinuses/Orbits: Unremarkable orbits. Paranasal sinuses and mastoid air cells are clear. Other: None. IMPRESSION: 1. 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 2. Otherwise unremarkable brain MRI. Electronically Signed   By: ALogan BoresM.D.   On: 12/31/2017 11:06        Scheduled Meds: . acetaminophen  1,000 mg Oral Q8H  . bupivacaine liposome  20 mL Infiltration Once  . bupivacaine-EPINEPHrine  50 mL Infiltration Once  . Chlorhexidine Gluconate Cloth  6 each Topical Q2200  . cyclobenzaprine  10 mg Oral  TID  . diclofenac sodium  2 g Topical QID  . docusate sodium  100 mg Oral BID  . fluticasone  2 spray Each Nare Daily  . guaiFENesin  1,200 mg Oral BID  . heparin  5,000 Units Subcutaneous Q8H  . lactobacillus  1 g Oral TID WC  . lidocaine  1 patch Transdermal Q24H  . magnesium oxide  400 mg Oral BID  . methadone  60 mg Oral Daily  . nicotine  21 mg Transdermal Daily  . nystatin cream   Topical BID  . pantoprazole  40 mg Oral Daily  . predniSONE  5 mg Oral Q breakfast  . senna-docusate  1 tablet Oral BID  . sodium chloride flush  10-40 mL Intracatheter Q12H  . tamsulosin  0.4 mg Oral Daily   Continuous Infusions: .  ceFAZolin (ANCEF) IV 2 g (12/31/17 1342)  . lactated ringers Stopped (12/26/17 0508)  . methocarbamol (ROBAXIN) IV 500 mg (12/29/17 2224)     LOS: 29 days    Time spent: over 92 min   Fayrene Helper, MD [facility] Pager [phone]  If 7PM-7AM, please contact night-coverage [email or web address] Password [email or web address] 12/31/2017, 7:37 PM

## 2018-01-01 DIAGNOSIS — W458XXS Other foreign body or object entering through skin, sequela: Secondary | ICD-10-CM

## 2018-01-01 LAB — BASIC METABOLIC PANEL
ANION GAP: 12 (ref 5–15)
ANION GAP: 10 (ref 5–15)
BUN: 18 mg/dL (ref 6–20)
BUN: 16 mg/dL (ref 6–20)
CALCIUM: 9.6 mg/dL (ref 8.9–10.3)
CHLORIDE: 100 mmol/L (ref 98–111)
CHLORIDE: 102 mmol/L (ref 98–111)
CO2: 27 mmol/L (ref 22–32)
CO2: 24 mmol/L (ref 22–32)
Calcium: 9.6 mg/dL (ref 8.9–10.3)
Creatinine, Ser: 0.55 mg/dL (ref 0.44–1.00)
Creatinine, Ser: 0.55 mg/dL (ref 0.44–1.00)
GFR calc non Af Amer: >60 mL/min (ref >60)
GFR calc non Af Amer: >60 mL/min (ref >60)
GLUCOSE: 91 mg/dL (ref 70–99)
Glucose, Bld: 100 mg/dL — ABNORMAL HIGH (ref 70–99)
POTASSIUM: 4.8 mmol/L (ref 3.5–5.1)
Potassium: 4.3 mmol/L (ref 3.5–5.1)
Sodium: 139 mmol/L (ref 135–145)
Sodium: 136 mmol/L (ref 135–145)

## 2018-01-01 LAB — CBC
HEMATOCRIT: 29.2 % — AB (ref 36.0–46.0)
HEMOGLOBIN: 8.8 g/dL — AB (ref 12.0–15.0)
MCH: 27.7 pg (ref 26.0–34.0)
MCHC: 30.1 g/dL (ref 30.0–36.0)
MCV: 91.8 fL (ref 78.0–100.0)
Platelets: 484 10*3/uL — ABNORMAL HIGH (ref 150–400)
RBC: 3.18 MIL/uL — ABNORMAL LOW (ref 3.87–5.11)
RDW: 15.5 % (ref 11.5–15.5)
WBC: 6.9 10*3/uL (ref 4.0–10.5)

## 2018-01-01 NOTE — Plan of Care (Signed)

## 2018-01-01 NOTE — Progress Notes (Signed)
Physical Therapy Treatment Patient Details Name: Kelly [name] MRN: [id number] DOB: October 14, 1985 Today's Date: 01/01/2018    History of Present Illness Kelly [name] is a 32 y.o. F with sepsis, tachycardia, tachypnea, leukocytosis, hypotension.  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 PMH of polysubstance abuse, ADD, scoliosis sp back surgery (20 years ago per mom). PICC placed 12/08/17.    PT Comments    Pt making good progress towards goals today. Focus of session to decrease anxiety and stiffness with ambulation. Pt is currently supervision for supine to sit, however still requires minA for managing LE back into bed. Pt is min guard for transfers and ambulation of 2x250 feet with RW. Pt encouraged to ambulate more with mobility tech in order to more quickly build back her strength.     Follow Up Recommendations  CIR     Equipment Recommendations  Rolling walker with 5" wheels;3in1 (PT)    Recommendations for Other Services       Precautions / Restrictions Precautions Precautions: Back;Fall Restrictions Weight Bearing Restrictions: No    Mobility  Bed Mobility Overal bed mobility: Needs Assistance   Rolling: Modified independent (Device/Increase time) Sidelying to sit: Supervision     Sit to sidelying: Min assist General bed mobility comments: cues for logroll, assist with LLE into bed  Transfers Overall transfer level: Needs assistance Equipment used: Rolling walker (2 wheeled) Transfers: Sit to/from Stand Sit to Stand: Min guard         General transfer comment: min guard for safety  Ambulation/Gait Ambulation/Gait assistance: Min guard Gait Distance (Feet): 500 Feet Assistive device: Rolling walker (2 wheeled) Gait Pattern/deviations: Step-through pattern;Decreased stride length Gait velocity: Decreased Gait velocity interpretation: <1.8 ft/sec, indicate of risk for  recurrent falls General Gait Details: Pt ambulated 250 feet, took seated rest break and ambulated 250 back to room, vc for decreasing UE support, with advancement of LE and for relaxation of shoulders and deep breathing to reduce anxiety          Balance Overall balance assessment: Needs assistance Sitting-balance support: Feet supported;No upper extremity supported Sitting balance-Leahy Scale: Good     Standing balance support: During functional activity;Bilateral upper extremity supported Standing balance-Leahy Scale: Fair                              Cognition Arousal/Alertness: Awake/alert Behavior During Therapy: WFL for tasks assessed/performed Overall Cognitive Status: Within Functional Limits for tasks assessed                                               Pertinent Vitals/Pain Pain Assessment: Faces Faces Pain Scale: Hurts little more Pain Location: L side of back Pain Descriptors / Indicators: Spasm Pain Intervention(s): Limited activity within patient's tolerance;Monitored during session;Repositioned           PT Goals (current goals can now be found in the care plan section) Acute Rehab PT Goals Patient Stated Goal: decrease pain and walk better PT Goal Formulation: With patient Time For Goal Achievement: 01/03/18 Potential to Achieve Goals: Good Progress towards PT goals: Progressing toward goals    Frequency    Min 3X/week      PT Plan Current plan remains appropriate       AM-PAC  PT "6 Clicks" Daily Activity  Outcome Measure  Difficulty turning over in bed (including adjusting bedclothes, sheets and blankets)?: A Little Difficulty moving from lying on back to sitting on the side of the bed? : Unable Difficulty sitting down on and standing up from a chair with arms (e.g., wheelchair, bedside commode, etc,.)?: Unable Help needed moving to and from a bed to chair (including a wheelchair)?: A Little Help needed  walking in hospital room?: A Little Help needed climbing 3-5 steps with a railing? : A Lot 6 Click Score: 13    End of Session Equipment Utilized During Treatment: Gait belt Activity Tolerance: Patient tolerated treatment well Patient left: with call bell/phone within reach;in bed Nurse Communication: Mobility status PT Visit Diagnosis: Unsteadiness on feet (R26.81);Other abnormalities of gait and mobility (R26.89);Muscle weakness (generalized) (M62.81);Difficulty in walking, not elsewhere classified (R26.2);Pain;Other symptoms and signs involving the nervous system (R29.898)     Time: 8110-3159 PT Time Calculation (min) (ACUTE ONLY): 32 min  Charges:  $Gait Training: 23-37 mins                     Kelly [name] B. Migdalia Dk PT, DPT Acute Rehabilitation  [phone] Pager [phone]     [place] 01/01/2018, 4:36 PM

## 2018-01-01 NOTE — Progress Notes (Signed)
PROGRESS NOTE  Kelly [name] KXF:[id number] DOB: [birth year] DOA: 12/02/2017 PCP: Patient, No Pcp Per  HPI/Brief Narrative  Kelly [name] is a 32 y.o. 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  Subjective No acute complaints currently.  Assessment/Plan:  1. 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  2. Lumbar epidural abscess, status post lumbar laminectomy and drainage of biceps on 7/25.  P MRI on 8/14 showed persistent discitis possible epidural soft thickening, neurosurgery at that time recommended no further intervention. Briefly needed Foley for urinary retention now resolved.  No new neurologic deficits does report some chronic left lower extremity weakness.  Continue Flomax  3. Severe tricuspid regurgitation.  In the setting of infectious endocarditis as mentioned above.  Cardiology recommends completing IV antibiotics repeating echo as outpatient to determine further need for intervention.  4. Adrenal insufficiency a.m. cortisol 3.4.  ACTH within normal range inappropriately.  Concern for secondary adrenal insufficiency.  Previous physician discussed with endocrinology on 8/19 who recommended steroids follow-up as outpatient.  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 prednisone 5 mg daily  5. Retained needle fragment.  X-ray confirms 8 mm needle fragment anterior to proximal humerus.  Will discuss with Ortho.  No signs or symptoms of acute infection at site.  6. History of scoliosis  status post previous surgery with chronic back pain.  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  Not confirmed on [place] prescriber database query based on previous physician because no recent prescription as mentioned below.  Per chart review patient was being given 80 mg of methadone at [facility] clinic in [place] with last dose on October 25, 2017 she has been discharged from the clinic.  Patient started on methadone in hospital currently on 60 mg with improvement in pain control.  She will follow-up with clinic on discharge discharge (arranged by mother)  59. Polysubstance abuse. Patient follow with outpatient rehab post discharge.  Will take response ability to prescribe methadone.  8. Vaginal candidiasis, continue nystatin  9. Urinary retention, resolved stool incontinence. Briefly required Foley for urinary retention which is now resolved.  Continue Flomax, bladder scans..  Per neurosurgery should resolve in time  10. Chronic anemia, stable.  Hemoglobin stable at 8.8.  Continue to monitor  Code Status: Full code  Family Communication: No family at bedside  Disposition Plan: Continue IV cefazolin   Consultants:  ID, neurosurgery, cardiology     Procedures: Study Conclusions  - Left ventricle: The cavity size was normal. Systolic function was normal. The estimated ejection fraction was in the range of 55% to 60%. Wall motion was normal; there were no regional wall motion abnormalities. Left ventricular diastolic function parameters were normal. - Aortic valve: Transvalvular velocity was within the normal range. There was no stenosis. There was no regurgitation. - Mitral valve: Thickening of the anterior leaflet. Transvalvular velocity was within the normal range. There was no evidence for stenosis. There was trivial regurgitation. - Left atrium: The atrium was mildly dilated. - Right ventricle: The cavity size was normal. Wall thickness was  normal. Systolic function  was normal. - Right atrium: The atrium was severely dilated. - Tricuspid valve: There was a medium-sized, 1.0 cm (W) x 1.7 cm (L) vegetation on the septal leaflet. There was severe regurgitation. - Pulmonary arteries: Systolic pressure was mildly increased. PA peak pressure: 42 mm Hg (S).  Impressions:  - Vegetation noted on the septal leaflet of the tricuspid valve consistent with endocarditis. Cannot exlude vegetation on the anterior leaflet of the mitral valve. Suggest TEE to better assess.  Antimicrobials: Anti-infectives (From admission, onward)   Start     Dose/Rate Route Frequency Ordered Stop   12/06/17 2200  ceFAZolin (ANCEF) IVPB 2g/100 mL premix     2 g 200 mL/hr over 30 Minutes Intravenous Every 8 hours 12/06/17 1016 01/29/18 2359   12/06/17 0600  vancomycin (VANCOCIN) IVPB 1000 mg/200 mL premix  Status:  Discontinued     1,000 mg 200 mL/hr over 60 Minutes Intravenous Every 8 hours 12/05/17 2316 12/06/17 1200   12/06/17 0000  cefTRIAXone (ROCEPHIN) 2 g in sodium chloride 0.9 % 100 mL IVPB  Status:  Discontinued     2 g 200 mL/hr over 30 Minutes Intravenous Every 24 hours 12/05/17 2238 12/06/17 1016   12/05/17 2300  vancomycin (VANCOCIN) 1,750 mg in sodium chloride 0.9 % 500 mL IVPB     1,750 mg 250 mL/hr over 120 Minutes Intravenous  Once 12/05/17 2246 12/06/17 0228   12/05/17 2055  vancomycin (VANCOCIN) powder  Status:  Discontinued       As needed 12/05/17 2055 12/05/17 2118   12/05/17 2001  bacitracin 50,000 Units in sodium chloride 0.9 % 500 mL irrigation  Status:  Discontinued       As needed 12/05/17 2002 12/05/17 2118   12/03/17 1600  azithromycin (ZITHROMAX) 500 mg in sodium chloride 0.9 % 250 mL IVPB  Status:  Discontinued     500 mg 250 mL/hr over 60 Minutes Intravenous Every 24 hours 12/02/17 1706 12/03/17 0948   12/03/17 1400  ceFAZolin (ANCEF) IVPB 2g/100 mL premix  Status:  Discontinued     2 g 200 mL/hr over 30 Minutes Intravenous Every  8 hours 12/03/17 0948 12/05/17 2238   12/03/17 1000  vancomycin (VANCOCIN) IVPB 750 mg/150 ml premix  Status:  Discontinued     750 mg 150 mL/hr over 60 Minutes Intravenous Every 12 hours 12/02/17 1710 12/03/17 0948   12/02/17 2200  piperacillin-tazobactam (ZOSYN) IVPB 3.375 g  Status:  Discontinued     3.375 g 12.5 mL/hr over 240 Minutes Intravenous Every 8 hours 12/02/17 1710 12/03/17 0948   12/02/17 1800  vancomycin (VANCOCIN) 1,500 mg in sodium chloride 0.9 % 500 mL IVPB     1,500 mg 250 mL/hr over 120 Minutes Intravenous  Once 12/02/17 1710 12/02/17 2110   12/02/17 1530  cefTRIAXone (ROCEPHIN) 1 g in sodium chloride 0.9 % 100 mL IVPB     1 g 200 mL/hr over 30 Minutes Intravenous  Once 12/02/17 1522 12/02/17 1626   12/02/17 1530  azithromycin (ZITHROMAX) 500 mg in sodium chloride 0.9 % 250 mL IVPB     500 mg 250 mL/hr over 60 Minutes Intravenous  Once 12/02/17 1522 12/02/17 1759         Cultures:    Telemetry: No  DVT prophylaxis: Heparin   Objective: Vitals:   01/01/18 0500 01/01/18 0507 01/01/18 1044 01/01/18 1416  BP:  92/64 123/82 113/75  Pulse:  90 97 77  Resp:  16 15 17   Temp:  98.1 F (36.7 C) 98 F (36.7 C) 98.6 F (37 C)  TempSrc:  Oral Oral Oral  SpO2:  98% 99% 99%  Weight: 78 kg     Height:        Intake/Output Summary (Last 24 hours) at 01/01/2018 1605 Last data filed at 01/01/2018 1530 Gross per 24 hour  Intake 450 ml  Output -  Net 450 ml   Filed Weights   12/29/17 0453 12/30/17 0500 01/01/18 0500  Weight: 77.1 kg 77.2 kg 78 kg    Exam:  Constitutional:normal appearing female Eyes: EOMI, anicteric, normal conjunctivae ENMT: Oropharynx with moist mucous membranes, normal dentition Cardiovascular: RRR no MRGs, with no peripheral edema Respiratory: Normal respiratory effort, clear breath sounds  Abdomen: Soft,non-tender, with no HSM Skin: No rash ulcers, or lesions. Without skin tenting, scar in right arm near inner elbow ( no redness  or tenderness) Neurologic: Grossly no focal neuro deficit. Psychiatric:Appropriate affect, and mood. Mental status AAOx3  Data Reviewed: CBC: Recent Labs  Lab 12/27/17 0432 12/31/17 0309 01/01/18 0414  WBC 7.6 8.9 6.9  HGB 8.5* 8.9* 8.8*  HCT 28.1* 29.7* 29.2*  MCV 89.2 90.3 91.8  PLT 613* 491* 322*   Basic Metabolic Panel: Recent Labs  Lab 12/27/17 0432 12/31/17 0309 01/01/18 0414 01/01/18 1117  NA 134* 136 139 136  K 4.4 4.4 4.3 4.8  CL 97* 99 100 102  CO2 30 28 27 24   GLUCOSE 104* 109* 91 100*  BUN 20 19 18 16   CREATININE 0.62 0.62 0.55 0.55  CALCIUM 9.6 9.9 9.6 9.6  MG 1.8 1.6*  --   --    GFR: Estimated Creatinine Clearance: 107.7 mL/min (by C-G formula based on SCr of 0.55 mg/dL). Liver Function Tests: No results for input(s): AST, ALT, ALKPHOS, BILITOT, PROT, ALBUMIN in the last 168 hours. No results for input(s): LIPASE, AMYLASE in the last 168 hours. No results for input(s): AMMONIA in the last 168 hours. Coagulation Profile: No results for input(s): INR, PROTIME in the last 168 hours. Cardiac Enzymes: No results for input(s): CKTOTAL, CKMB, CKMBINDEX, TROPONINI in the last 168 hours. BNP (last 3 results) No results for input(s): PROBNP in the last 8760 hours. HbA1C: No results for input(s): HGBA1C in the last 72 hours. CBG: No results for input(s): GLUCAP in the last 168 hours. Lipid Profile: No results for input(s): CHOL, HDL, LDLCALC, TRIG, CHOLHDL, LDLDIRECT in the last 72 hours. Thyroid Function Tests: No results for input(s): TSH, T4TOTAL, FREET4, T3FREE, THYROIDAB in the last 72 hours. Anemia Panel: No results for input(s): VITAMINB12, FOLATE, FERRITIN, TIBC, IRON, RETICCTPCT in the last 72 hours. Urine analysis:    Component Value Date/Time   COLORURINE YELLOW 12/21/2017 0942   APPEARANCEUR CLEAR 12/21/2017 0942   APPEARANCEUR Hazy 07/10/2013 1121   LABSPEC 1.023 12/21/2017 0942   LABSPEC 1.020 07/10/2013 1121   PHURINE 5.0 12/21/2017  0942   GLUCOSEU NEGATIVE 12/21/2017 0942   GLUCOSEU Negative 07/10/2013 1121   HGBUR SMALL (A) 12/21/2017 0942   BILIRUBINUR NEGATIVE 12/21/2017 0942   BILIRUBINUR Negative 07/10/2013 1121   KETONESUR NEGATIVE 12/21/2017 0942   PROTEINUR NEGATIVE 12/21/2017 0942   UROBILINOGEN 1.0 02/15/2015 0530   NITRITE NEGATIVE 12/21/2017 0942   LEUKOCYTESUR MODERATE (A) 12/21/2017 0942   LEUKOCYTESUR Trace 07/10/2013 1121   Sepsis Labs: @LABRCNTIP (procalcitonin:4,lacticidven:4)  )No results found for this or any previous visit (from the past 240 hour(s)).    Studies: Dg Elbow 2 Views Right  Result Date: 12/31/2017 CLINICAL DATA:  Pt c/o possible foreign bodies (needles) in her right anterior elbow and right anterior distal forearm. Pt states she had surgery to remove a needle in her right elbow 1.5 years ago. EXAM: RIGHT ELBOW - 2 VIEW COMPARISON:  None. FINDINGS: There is a radiopaque linear foreign body measuring 8 millimeters along the distal anterior aspect of the humerus, consistent with needle fragment. No acute fracture or soft tissue gas. IMPRESSION: 8 millimeter needle fragment anterior to the proximal humerus. Electronically Signed   By: Nolon Nations M.D.   On: 12/31/2017 21:09   Dg Forearm Right  Result Date: 12/31/2017 CLINICAL DATA:  Pt c/o possible foreign bodies (needles) in her right anterior elbow and right anterior distal forearm. Pt states she had surgery to remove a needle in her right elbow 1.5 years ago. EXAM: RIGHT FOREARM - 2 VIEW COMPARISON:  05/27/2014 FINDINGS: There is a linear metallic fragment along the MEDIAL aspect of the distal radius which measures 8 millimeters in length. This is oriented anterior to posterior. No acute fracture. No soft tissue gas. IMPRESSION: 8 millimeter needle fragment along the distal MEDIAL aspect of the radius. Electronically Signed   By: Nolon Nations M.D.   On: 12/31/2017 21:08    Scheduled Meds: . acetaminophen  1,000 mg Oral Q8H    . bupivacaine liposome  20 mL Infiltration Once  . bupivacaine-EPINEPHrine  50 mL Infiltration Once  . Chlorhexidine Gluconate Cloth  6 each Topical Q2200  . cyclobenzaprine  10 mg Oral TID  . diclofenac sodium  2 g Topical QID  . docusate sodium  100 mg Oral BID  . fluticasone  2 spray Each Nare Daily  . guaiFENesin  1,200 mg Oral BID  . heparin  5,000 Units Subcutaneous Q8H  . lactobacillus  1 g Oral TID WC  . lidocaine  1 patch Transdermal Q24H  . magnesium oxide  400 mg Oral BID  . methadone  60 mg Oral Daily  . nicotine  21 mg Transdermal Daily  . nystatin cream   Topical BID  . pantoprazole  40 mg Oral Daily  . predniSONE  5 mg Oral Q breakfast  . senna-docusate  1 tablet Oral BID  . sodium chloride flush  10-40 mL Intracatheter Q12H  . tamsulosin  0.4 mg Oral Daily    Continuous Infusions: .  ceFAZolin (ANCEF) IV 2 g (01/01/18 1404)  . lactated ringers Stopped (12/26/17 0508)  . methocarbamol (ROBAXIN) IV 500 mg (12/29/17 2224)     LOS: 30 days     [name], MD [facility] Pager [phone]  If 7PM-7AM, please contact night-coverage [email or web address] Password [facility] 01/01/2018, 4:05 PM

## 2018-01-01 NOTE — Plan of Care (Signed)
  Problem: Health Behavior/Discharge Planning: Goal: Ability to manage health-related needs will improve Outcome: Progressing   Problem: Clinical Measurements: Goal: Ability to maintain clinical measurements within normal limits will improve Outcome: Progressing Goal: Will remain free from infection Outcome: Progressing   Problem: Activity: Goal: Risk for activity intolerance will decrease Outcome: Progressing   Problem: Coping: Goal: Level of anxiety will decrease Outcome: Progressing

## 2018-01-02 LAB — PROLACTIN: PROLACTIN: 22.1 ng/mL (ref 4.8–23.3)

## 2018-01-02 NOTE — Progress Notes (Signed)
PROGRESS NOTE  Kelly [name] DOB: November 19, 1985 DOA: 12/02/2017 PCP: Patient, No Pcp Per  HPI/Brief Narrative  Kelly [name] is a 32 y.o. 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  Subjective No acute complaints currently.  Assessment/Plan:  1. 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  2. Lumbar epidural abscess, status post lumbar laminectomy and drainage of biceps on 7/25.  P MRI on 8/14 showed persistent discitis possible epidural soft thickening, neurosurgery at that time recommended no further intervention. Briefly needed Foley for urinary retention now resolved.  No new neurologic deficits does report some chronic left lower extremity weakness.  Continue Flomax  3. Severe tricuspid regurgitation.  In the setting of infectious endocarditis as mentioned above.  Cardiology recommends completing IV antibiotics repeating echo as outpatient to determine further need for intervention.  4. Adrenal insufficiency a.m. cortisol 3.4.  ACTH within normal range inappropriately.  Concern for secondary adrenal insufficiency.  Previous physician discussed with endocrinology on 8/19 who recommended steroids follow-up as outpatient.  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 prednisone 5 mg daily  5. Retained needle fragment.  X-ray confirms 8 mm needle fragment anterior to proximal humerus.   No signs or symptoms of acute infection at site.  Discuss with Ortho/ hand on call  surgeon  6. History of scoliosis status post previous surgery with chronic back pain.  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  Not confirmed on [facility] prescriber database query based on previous physician because no recent prescription as mentioned below.  Per chart review patient was being given 80 mg of methadone at [facility] clinic in [place] with last dose on October 25, 2017 she has been discharged from the clinic.  Patient started on methadone in hospital currently on 60 mg with improvement in pain control.  She will follow-up with clinic on discharge discharge (arranged by mother)  12. Polysubstance abuse. Patient follow with outpatient rehab post discharge.  Will take response ability to prescribe methadone.  8. Vaginal candidiasis, continue nystatin  9. Urinary retention, resolved stool incontinence. Briefly required Foley for urinary retention which is now resolved.  Continue Flomax, bladder scans..  Per neurosurgery should resolve in time  10. Chronic anemia, stable.  Hemoglobin stable at 8.8.  Continue to monitor  Code Status: Full code  Family Communication: No family at bedside  Disposition Plan: Continue IV cefazolin   Consultants:  ID, neurosurgery, cardiology     Procedures: Study Conclusions  - Left ventricle: The cavity size was normal. Systolic function was normal. The estimated ejection fraction was in the range of 55% to 60%. Wall motion was normal; there were no regional wall motion abnormalities. Left ventricular diastolic function parameters were normal. - Aortic valve: Transvalvular velocity was within the normal range. There was no stenosis. There was no regurgitation. - Mitral valve: Thickening of the anterior leaflet. Transvalvular velocity was within the normal range. There was no evidence for stenosis. There was trivial regurgitation. - Left atrium: The atrium was mildly dilated. - Right ventricle: The cavity size was  normal. Wall  thickness was normal. Systolic function was normal. - Right atrium: The atrium was severely dilated. - Tricuspid valve: There was a medium-sized, 1.0 cm (W) x 1.7 cm (L) vegetation on the septal leaflet. There was severe regurgitation. - Pulmonary arteries: Systolic pressure was mildly increased. PA peak pressure: 42 mm Hg (S).  Impressions:  - Vegetation noted on the septal leaflet of the tricuspid valve consistent with endocarditis. Cannot exlude vegetation on the anterior leaflet of the mitral valve. Suggest TEE to better assess.  Antimicrobials: Anti-infectives (From admission, onward)   Start     Dose/Rate Route Frequency Ordered Stop   12/06/17 2200  ceFAZolin (ANCEF) IVPB 2g/100 mL premix     2 g 200 mL/hr over 30 Minutes Intravenous Every 8 hours 12/06/17 1016 01/29/18 2359   12/06/17 0600  vancomycin (VANCOCIN) IVPB 1000 mg/200 mL premix  Status:  Discontinued     1,000 mg 200 mL/hr over 60 Minutes Intravenous Every 8 hours 12/05/17 2316 12/06/17 1200   12/06/17 0000  cefTRIAXone (ROCEPHIN) 2 g in sodium chloride 0.9 % 100 mL IVPB  Status:  Discontinued     2 g 200 mL/hr over 30 Minutes Intravenous Every 24 hours 12/05/17 2238 12/06/17 1016   12/05/17 2300  vancomycin (VANCOCIN) 1,750 mg in sodium chloride 0.9 % 500 mL IVPB     1,750 mg 250 mL/hr over 120 Minutes Intravenous  Once 12/05/17 2246 12/06/17 0228   12/05/17 2055  vancomycin (VANCOCIN) powder  Status:  Discontinued       As needed 12/05/17 2055 12/05/17 2118   12/05/17 2001  bacitracin 50,000 Units in sodium chloride 0.9 % 500 mL irrigation  Status:  Discontinued       As needed 12/05/17 2002 12/05/17 2118   12/03/17 1600  azithromycin (ZITHROMAX) 500 mg in sodium chloride 0.9 % 250 mL IVPB  Status:  Discontinued     500 mg 250 mL/hr over 60 Minutes Intravenous Every 24 hours 12/02/17 1706 12/03/17 0948   12/03/17 1400  ceFAZolin (ANCEF) IVPB 2g/100 mL premix  Status:  Discontinued     2  g 200 mL/hr over 30 Minutes Intravenous Every 8 hours 12/03/17 0948 12/05/17 2238   12/03/17 1000  vancomycin (VANCOCIN) IVPB 750 mg/150 ml premix  Status:  Discontinued     750 mg 150 mL/hr over 60 Minutes Intravenous Every 12 hours 12/02/17 1710 12/03/17 0948   12/02/17 2200  piperacillin-tazobactam (ZOSYN) IVPB 3.375 g  Status:  Discontinued     3.375 g 12.5 mL/hr over 240 Minutes Intravenous Every 8 hours 12/02/17 1710 12/03/17 0948   12/02/17 1800  vancomycin (VANCOCIN) 1,500 mg in sodium chloride 0.9 % 500 mL IVPB     1,500 mg 250 mL/hr over 120 Minutes Intravenous  Once 12/02/17 1710 12/02/17 2110   12/02/17 1530  cefTRIAXone (ROCEPHIN) 1 g in sodium chloride 0.9 % 100 mL IVPB     1 g 200 mL/hr over 30 Minutes Intravenous  Once 12/02/17 1522 12/02/17 1626   12/02/17 1530  azithromycin (ZITHROMAX) 500 mg in sodium chloride 0.9 % 250 mL IVPB     500 mg 250 mL/hr over 60 Minutes Intravenous  Once 12/02/17 1522 12/02/17 1759        Cultures:    Telemetry: No  DVT prophylaxis: Heparin   Objective: Vitals:   01/01/18 1416 01/01/18 2044 01/02/18 0533 01/02/18 1422  BP: 113/75 110/70 109/69 107/85  Pulse: 77 79 83 94  Resp: 17 16 16  16  Temp: 98.6 F (37 C) 97.9 F (36.6 C) 98 F (36.7 C) 97.7 F (36.5 C)  TempSrc: Oral Oral Oral Oral  SpO2: 99% 98% 97% 98%  Weight:   79.7 kg   Height:        Intake/Output Summary (Last 24 hours) at 01/02/2018 1830 Last data filed at 01/02/2018 1423 Gross per 24 hour  Intake 990 ml  Output -  Net 990 ml   Filed Weights   12/30/17 0500 01/01/18 0500 01/02/18 0533  Weight: 77.2 kg 78 kg 79.7 kg    Exam:  Constitutional:normal appearing female Eyes: EOMI, anicteric, normal conjunctivae ENMT: Oropharynx with moist mucous membranes, normal dentition Cardiovascular: RRR no MRGs, with no peripheral edema Respiratory: Normal respiratory effort, clear breath sounds  Abdomen: Soft,non-tender, with no HSM Skin: No rash ulcers,  or lesions. Without skin tenting, scar in right arm near inner elbow ( no redness or tenderness) Neurologic: Grossly no focal neuro deficit. Psychiatric:Appropriate affect, and mood. Mental status AAOx3  Data Reviewed: CBC: Recent Labs  Lab 12/27/17 0432 12/31/17 0309 01/01/18 0414  WBC 7.6 8.9 6.9  HGB 8.5* 8.9* 8.8*  HCT 28.1* 29.7* 29.2*  MCV 89.2 90.3 91.8  PLT 613* 491* 570*   Basic Metabolic Panel: Recent Labs  Lab 12/27/17 0432 12/31/17 0309 01/01/18 0414 01/01/18 1117  NA 134* 136 139 136  K 4.4 4.4 4.3 4.8  CL 97* 99 100 102  CO2 30 28 27 24   GLUCOSE 104* 109* 91 100*  BUN 20 19 18 16   CREATININE 0.62 0.62 0.55 0.55  CALCIUM 9.6 9.9 9.6 9.6  MG 1.8 1.6*  --   --    GFR: Estimated Creatinine Clearance: 108.6 mL/min (by C-G formula based on SCr of 0.55 mg/dL). Liver Function Tests: No results for input(s): AST, ALT, ALKPHOS, BILITOT, PROT, ALBUMIN in the last 168 hours. No results for input(s): LIPASE, AMYLASE in the last 168 hours. No results for input(s): AMMONIA in the last 168 hours. Coagulation Profile: No results for input(s): INR, PROTIME in the last 168 hours. Cardiac Enzymes: No results for input(s): CKTOTAL, CKMB, CKMBINDEX, TROPONINI in the last 168 hours. BNP (last 3 results) No results for input(s): PROBNP in the last 8760 hours. HbA1C: No results for input(s): HGBA1C in the last 72 hours. CBG: No results for input(s): GLUCAP in the last 168 hours. Lipid Profile: No results for input(s): CHOL, HDL, LDLCALC, TRIG, CHOLHDL, LDLDIRECT in the last 72 hours. Thyroid Function Tests: No results for input(s): TSH, T4TOTAL, FREET4, T3FREE, THYROIDAB in the last 72 hours. Anemia Panel: No results for input(s): VITAMINB12, FOLATE, FERRITIN, TIBC, IRON, RETICCTPCT in the last 72 hours. Urine analysis:    Component Value Date/Time   COLORURINE YELLOW 12/21/2017 0942   APPEARANCEUR CLEAR 12/21/2017 0942   APPEARANCEUR Hazy 07/10/2013 1121   LABSPEC  1.023 12/21/2017 0942   LABSPEC 1.020 07/10/2013 1121   PHURINE 5.0 12/21/2017 0942   GLUCOSEU NEGATIVE 12/21/2017 0942   GLUCOSEU Negative 07/10/2013 1121   HGBUR SMALL (A) 12/21/2017 0942   BILIRUBINUR NEGATIVE 12/21/2017 0942   BILIRUBINUR Negative 07/10/2013 1121   KETONESUR NEGATIVE 12/21/2017 0942   PROTEINUR NEGATIVE 12/21/2017 0942   UROBILINOGEN 1.0 02/15/2015 0530   NITRITE NEGATIVE 12/21/2017 0942   LEUKOCYTESUR MODERATE (A) 12/21/2017 0942   LEUKOCYTESUR Trace 07/10/2013 1121   Sepsis Labs: @LABRCNTIP (procalcitonin:4,lacticidven:4)  )No results found for this or any previous visit (from the past 240 hour(s)).    Studies: No results found.  Scheduled Meds: .  acetaminophen  1,000 mg Oral Q8H  . bupivacaine liposome  20 mL Infiltration Once  . bupivacaine-EPINEPHrine  50 mL Infiltration Once  . Chlorhexidine Gluconate Cloth  6 each Topical Q2200  . cyclobenzaprine  10 mg Oral TID  . diclofenac sodium  2 g Topical QID  . docusate sodium  100 mg Oral BID  . fluticasone  2 spray Each Nare Daily  . guaiFENesin  1,200 mg Oral BID  . heparin  5,000 Units Subcutaneous Q8H  . lactobacillus  1 g Oral TID WC  . lidocaine  1 patch Transdermal Q24H  . magnesium oxide  400 mg Oral BID  . methadone  60 mg Oral Daily  . nicotine  21 mg Transdermal Daily  . nystatin cream   Topical BID  . pantoprazole  40 mg Oral Daily  . predniSONE  5 mg Oral Q breakfast  . senna-docusate  1 tablet Oral BID  . sodium chloride flush  10-40 mL Intracatheter Q12H  . tamsulosin  0.4 mg Oral Daily    Continuous Infusions: .  ceFAZolin (ANCEF) IV 2 g (01/02/18 1242)  . lactated ringers Stopped (12/26/17 0508)  . methocarbamol (ROBAXIN) IV Stopped (12/29/17 2335)     LOS: 31 days     Kelly Hane, MD [facility] Pager [phone]  If 7PM-7AM, please contact night-coverage [email or web address] Password [facility] 01/02/2018, 6:30 PM

## 2018-01-02 NOTE — Progress Notes (Signed)
Occupational Therapy Treatment Patient Details Name: MYSTIQUE BJELLAND MRN: [id number] DOB: August 14, 1985 Today's Date: 01/02/2018    History of present illness Kelly [name] is a 32 y.o. F with sepsis, tachycardia, tachypnea, leukocytosis, hypotension.  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 PMH of polysubstance abuse, ADD, scoliosis sp back surgery (20 years ago per mom). PICC placed 12/08/17.   OT comments  Patient progressing well.  Demonstrating ability to complete LB dressing with min guard assist today, able to demonstrate figure 4 technique to don B socks/shoes without assistance and adherence to back precautions given increased time.  Patient continues to require min guard for all transfers for safety and balance, given cueing for hand placement and precautions, toileting with min guard assistance and grooming standing with min guard assistance.  Continues to report increased fear of falling with 0 hand support, but progressing slowly.  Plan for next session to focus on standing balance and tolerance for ADLs.    Follow Up Recommendations  CIR;Supervision/Assistance - 24 hour    Equipment Recommendations  3 in 1 bedside commode;Wheelchair (measurements OT);Wheelchair cushion (measurements OT)    Recommendations for Other Services Rehab consult    Precautions / Restrictions Precautions Precautions: Back;Fall Restrictions Weight Bearing Restrictions: No       Mobility Bed Mobility Overal bed mobility: Needs Assistance   Rolling: Modified independent (Device/Increase time) Sidelying to sit: Modified independent (Device/Increase time)       General bed mobility comments: no cueing required for bed mobility adhering to back precautions   Transfers Overall transfer level: Needs assistance Equipment used: Rolling walker (2 wheeled) Transfers: Sit to/from Stand Sit to Stand: Min guard          General transfer comment: min guard for safety    Balance Overall balance assessment: Needs assistance Sitting-balance support: Feet supported;No upper extremity supported Sitting balance-Leahy Scale: Good     Standing balance support: Bilateral upper extremity supported;No upper extremity supported Standing balance-Leahy Scale: Poor Standing balance comment: preference to B UE support, able to complete grooming standing with min guard for safety                           ADL either performed or assessed with clinical judgement   ADL Overall ADL's : Needs assistance/impaired     Grooming: Min guard;Standing;Wash/dry hands Grooming Details (indicate cue type and reason): patient standing at sink to wash hands with min gaurd assistance, intermittent 1 hand support but able to maintain 0 hand support without LOB; educated on walker management and safety              Lower Body Dressing: Min guard;Sit to/from stand;Cueing for safety;Cueing for compensatory techniques;Adhering to back precautions Lower Body Dressing Details (indicate cue type and reason): patient able to complete figure 4 technique today while adhering to back precautions, to don B socks and shoes without AE or assistance; continues to require min guard once satnding  Toilet Transfer: Min guard;Ambulation;Comfort height toilet;Grab bars;RW Armed forces technical officer Details (indicate cue type and reason): min guard for safety and balance Toileting- Clothing Manipulation and Hygiene: Min guard;Cueing for back precautions;Cueing for compensatory techniques;Sitting/lateral lean;Sit to/from stand Toileting - Clothing Manipulation Details (indicate cue type and reason): lateral lean for hygiene and min guard standing for clothing mgmt; increased time and effort     Functional mobility during ADLs: Min guard;Rolling walker General ADL Comments: intermittent  cueing for safety and back precautions, completing bed  mobility, LB ADLs, toileting/toilet transfers and grooming standing at sink; fearful of falling without UE support     Vision       Perception     Praxis      Cognition Arousal/Alertness: Awake/alert Behavior During Therapy: WFL for tasks assessed/performed Overall Cognitive Status: Within Functional Limits for tasks assessed                                          Exercises     Shoulder Instructions       General Comments      Pertinent Vitals/ Pain       Pain Assessment: Faces Faces Pain Scale: Hurts little more Pain Location: lower back (L side) Pain Descriptors / Indicators: Discomfort Pain Intervention(s): Limited activity within patient's tolerance;Repositioned  Home Living                                          Prior Functioning/Environment              Frequency  Min 2X/week        Progress Toward Goals  OT Goals(current goals can now be found in the care plan section)  Progress towards OT goals: Progressing toward goals  Acute Rehab OT Goals Patient Stated Goal: decrease pain and walk better OT Goal Formulation: With patient Time For Goal Achievement: 01/07/18 Potential to Achieve Goals: Good  Plan Frequency remains appropriate;Discharge plan remains appropriate    Co-evaluation                 AM-PAC PT "6 Clicks" Daily Activity     Outcome Measure   Help from another person eating meals?: None Help from another person taking care of personal grooming?: A Little Help from another person toileting, which includes using toliet, bedpan, or urinal?: A Little Help from another person bathing (including washing, rinsing, drying)?: A Little Help from another person to put on and taking off regular upper body clothing?: A Little Help from another person to put on and taking off regular lower body clothing?: A Little 6 Click Score: 19    End of Session Equipment Utilized During Treatment: Gait  belt;Rolling walker  OT Visit Diagnosis: Unsteadiness on feet (R26.81);Other abnormalities of gait and mobility (R26.89);Pain Pain - Right/Left: Left Pain - part of body: (back)   Activity Tolerance Patient tolerated treatment well   Patient Left with call bell/phone within reach(seated EOB)   Nurse Communication Mobility status        Time: 5427-0623 OT Time Calculation (min): 23 min  Charges: OT General Charges $OT Visit: 1 Visit OT Treatments $Self Care/Home Management : 23-37 mins  Delight Stare, OTR/L  Pager [place] 01/02/2018, 4:09 PM

## 2018-01-03 NOTE — Progress Notes (Signed)
Physical Therapy Treatment Patient Details Name: Kelly [name] MRN: [id number] DOB: 08-Apr-1986 Today's Date: 01/03/2018    History of Present Illness Kelly [name] is a 32 y.o. F with sepsis, tachycardia, tachypnea, leukocytosis, hypotension.  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 PMH of polysubstance abuse, ADD, scoliosis sp back surgery (20 years ago per mom). PICC placed 12/08/17.    PT Comments    Pt admitted with above diagnosis. Pt currently with functional limitations due to balance and endurance deficits. Pt was able to ambulate with RW with continued progression each treatment.  Pt met 3/6 goals.  Some goals unmet due to slower progression than initially anticipated.  Revised goals.  Continue acute PT.   Pt will benefit from skilled PT to increase their independence and safety with mobility to allow discharge to the venue listed below.     Follow Up Recommendations  CIR     Equipment Recommendations  Rolling walker with 5" wheels;3in1 (PT)    Recommendations for Other Services Rehab consult     Precautions / Restrictions Precautions Precautions: Back;Fall Precaution Booklet Issued: No Precaution Comments: reviewed 3/3 back precautions, pt able to recall 2/3 without assist  Restrictions Weight Bearing Restrictions: No    Mobility  Bed Mobility Overal bed mobility: Needs Assistance Bed Mobility: Rolling;Sidelying to Sit;Sit to Sidelying Rolling: Modified independent (Device/Increase time) Sidelying to sit: Modified independent (Device/Increase time)   Sit to supine: Supervision   General bed mobility comments: no cueing required for bed mobility adhering to back precautions.  Was going to have pt progress out of a flat bed however pt states she will work toward that and used rail and Slope raised slightly today.   Transfers Overall transfer level: Needs assistance Equipment  used: Rolling walker (2 wheeled) Transfers: Sit to/from Stand Sit to Stand: Min guard         General transfer comment: min guard for safety  Ambulation/Gait Ambulation/Gait assistance: Min guard Gait Distance (Feet): 300 Feet Assistive device: Rolling walker (2 wheeled) Gait Pattern/deviations: Step-through pattern;Decreased stride length Gait velocity: Decreased Gait velocity interpretation: <1.8 ft/sec, indicate of risk for recurrent falls General Gait Details: Pt had just ambulated with mom recently therefore limited distance today.  Pt was not increasing UE support, and was keeping her left LE straight with advancement.  Steady gait and no cues needed with pt self monitoring.  Does fatigue.    Stairs             Wheelchair Mobility    Modified Rankin (Stroke Patients Only)       Balance Overall balance assessment: Needs assistance Sitting-balance support: Feet supported;No upper extremity supported Sitting balance-Leahy Scale: Good     Standing balance support: Bilateral upper extremity supported;No upper extremity supported Standing balance-Leahy Scale: Poor Standing balance comment: preference to B UE support, difficulty standing with less than bil UE support.                             Cognition Arousal/Alertness: Awake/alert Behavior During Therapy: WFL for tasks assessed/performed Overall Cognitive Status: Within Functional Limits for tasks assessed                                        Exercises      General Comments General comments (  skin integrity, edema, etc.): Pt donned socks with incr time      Pertinent Vitals/Pain Pain Assessment: Faces Faces Pain Scale: Hurts little more Pain Location: lower back (L side) Pain Descriptors / Indicators: Discomfort Pain Intervention(s): Limited activity within patient's tolerance;Monitored during session;Premedicated before session;Repositioned    Home Living                       Prior Function            PT Goals (current goals can now be found in the care plan section) Acute Rehab PT Goals PT Goal Formulation: With patient Time For Goal Achievement: 01/17/18 Potential to Achieve Goals: Good Progress towards PT goals: Progressing toward goals    Frequency    Min 3X/week      PT Plan Current plan remains appropriate    Co-evaluation              AM-PAC PT "6 Clicks" Daily Activity  Outcome Measure  Difficulty turning over in bed (including adjusting bedclothes, sheets and blankets)?: A Little Difficulty moving from lying on back to sitting on the side of the bed? : A Little Difficulty sitting down on and standing up from a chair with arms (e.g., wheelchair, bedside commode, etc,.)?: A Little Help needed moving to and from a bed to chair (including a wheelchair)?: A Little Help needed walking in hospital room?: A Little Help needed climbing 3-5 steps with a railing? : A Lot 6 Click Score: 17    End of Session Equipment Utilized During Treatment: Gait belt Activity Tolerance: Patient tolerated treatment well Patient left: with call bell/phone within reach;in bed Nurse Communication: Mobility status PT Visit Diagnosis: Unsteadiness on feet (R26.81);Other abnormalities of gait and mobility (R26.89);Muscle weakness (generalized) (M62.81);Difficulty in walking, not elsewhere classified (R26.2);Pain;Other symptoms and signs involving the nervous system (R29.898) Pain - Right/Left: (spine) Pain - part of body: (Back)     Time: 3159-4585 PT Time Calculation (min) (ACUTE ONLY): 19 min  Charges:  $Gait Training: 8-22 mins                     [place] [phone] (pager)    Denice Paradise 01/03/2018, 5:04 PM

## 2018-01-03 NOTE — Progress Notes (Signed)
PROGRESS NOTE  Kelly [name] IOE:[id number] DOB: 1986-03-02 DOA: 12/02/2017 PCP: Patient, No Pcp Per  HPI/Brief Narrative  Kelly [name] is a 32 y.o. 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  Subjective No acute complaints currently.  Assessment/Plan:  1. 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  2. Lumbar epidural abscess, status post lumbar laminectomy and drainage of biceps on 7/25.  P MRI on 8/14 showed persistent discitis possible epidural soft thickening, neurosurgery at that time recommended no further intervention. Briefly needed Foley for urinary retention now resolved.  No new neurologic deficits does report some chronic left lower extremity weakness.  Continue Flomax  3. Severe tricuspid regurgitation.  In the setting of infectious endocarditis as mentioned above.  Cardiology recommends completing IV antibiotics repeating echo as outpatient to determine further need for intervention.  4. Adrenal insufficiency a.m. cortisol 3.4.  ACTH within normal range inappropriately.  Concern for secondary adrenal insufficiency.  Previous physician discussed with endocrinology on 8/19 who recommended steroids follow-up as outpatient.  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 prednisone 5 mg daily  5. Retained needle fragment.  X-ray confirms 8 mm needle fragment anterior to proximal humerus (prior surgery 05/2016, for retained needle with associated infection).   No signs or symptoms of acute  infection at site.  Spoke with Silvestre Gunner who agrees given asymptomatic, location(not in joint) no need for surgical intervention 6.  7. History of scoliosis status post previous surgery with chronic back pain.  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  Not confirmed on [place] prescriber database query based on previous physician because no recent prescription as mentioned below.  Per chart review patient was being given 80 mg of methadone at [name] clinic in [place] with last dose on October 25, 2017 she has been discharged from the clinic.  Patient started on methadone in hospital currently on 60 mg with improvement in pain control.  She will follow-up with clinic on discharge discharge (arranged by mother)  94. Polysubstance abuse. Patient follow with outpatient rehab post discharge.  Will take response ability to prescribe methadone.  9. Vaginal candidiasis, continue nystatin  10. Urinary retention, resolved stool incontinence. Briefly required Foley for urinary retention which is now resolved.  Continue Flomax, bladder scans..  Per neurosurgery should resolve in time  23. Chronic anemia, stable.  Hemoglobin stable at 8.8.  Continue to monitor  Code Status: Full code  Family Communication: No family at bedside  Disposition Plan: Continue IV cefazolin   Consultants:  ID, neurosurgery, cardiology     Procedures: Study Conclusions  - Left ventricle: The cavity size was normal. Systolic function was normal. The estimated ejection fraction was in the range of 55% to 60%. Wall motion was normal; there were no regional wall motion abnormalities. Left ventricular diastolic function parameters were normal. - Aortic valve: Transvalvular velocity was within the normal range. There was no stenosis. There was no regurgitation. - Mitral valve: Thickening of the anterior leaflet. Transvalvular velocity was within the normal range. There was no evidence for stenosis.  There was  trivial regurgitation. - Left atrium: The atrium was mildly dilated. - Right ventricle: The cavity size was normal. Wall thickness was normal. Systolic function was normal. - Right atrium: The atrium was severely dilated. - Tricuspid valve: There was a medium-sized, 1.0 cm (W) x 1.7 cm (L) vegetation on the septal leaflet. There was severe regurgitation. - Pulmonary arteries: Systolic pressure was mildly increased. PA peak pressure: 42 mm Hg (S).  Impressions:  - Vegetation noted on the septal leaflet of the tricuspid valve consistent with endocarditis. Cannot exlude vegetation on the anterior leaflet of the mitral valve. Suggest TEE to better assess.  Antimicrobials: Anti-infectives (From admission, onward)   Start     Dose/Rate Route Frequency Ordered Stop   12/06/17 2200  ceFAZolin (ANCEF) IVPB 2g/100 mL premix     2 g 200 mL/hr over 30 Minutes Intravenous Every 8 hours 12/06/17 1016 01/29/18 2359   12/06/17 0600  vancomycin (VANCOCIN) IVPB 1000 mg/200 mL premix  Status:  Discontinued     1,000 mg 200 mL/hr over 60 Minutes Intravenous Every 8 hours 12/05/17 2316 12/06/17 1200   12/06/17 0000  cefTRIAXone (ROCEPHIN) 2 g in sodium chloride 0.9 % 100 mL IVPB  Status:  Discontinued     2 g 200 mL/hr over 30 Minutes Intravenous Every 24 hours 12/05/17 2238 12/06/17 1016   12/05/17 2300  vancomycin (VANCOCIN) 1,750 mg in sodium chloride 0.9 % 500 mL IVPB     1,750 mg 250 mL/hr over 120 Minutes Intravenous  Once 12/05/17 2246 12/06/17 0228   12/05/17 2055  vancomycin (VANCOCIN) powder  Status:  Discontinued       As needed 12/05/17 2055 12/05/17 2118   12/05/17 2001  bacitracin 50,000 Units in sodium chloride 0.9 % 500 mL irrigation  Status:  Discontinued       As needed 12/05/17 2002 12/05/17 2118   12/03/17 1600  azithromycin (ZITHROMAX) 500 mg in sodium chloride 0.9 % 250 mL IVPB  Status:  Discontinued     500 mg 250 mL/hr over 60 Minutes Intravenous Every 24  hours 12/02/17 1706 12/03/17 0948   12/03/17 1400  ceFAZolin (ANCEF) IVPB 2g/100 mL premix  Status:  Discontinued     2 g 200 mL/hr over 30 Minutes Intravenous Every 8 hours 12/03/17 0948 12/05/17 2238   12/03/17 1000  vancomycin (VANCOCIN) IVPB 750 mg/150 ml premix  Status:  Discontinued     750 mg 150 mL/hr over 60 Minutes Intravenous Every 12 hours 12/02/17 1710 12/03/17 0948   12/02/17 2200  piperacillin-tazobactam (ZOSYN) IVPB 3.375 g  Status:  Discontinued     3.375 g 12.5 mL/hr over 240 Minutes Intravenous Every 8 hours 12/02/17 1710 12/03/17 0948   12/02/17 1800  vancomycin (VANCOCIN) 1,500 mg in sodium chloride 0.9 % 500 mL IVPB     1,500 mg 250 mL/hr over 120 Minutes Intravenous  Once 12/02/17 1710 12/02/17 2110   12/02/17 1530  cefTRIAXone (ROCEPHIN) 1 g in sodium chloride 0.9 % 100 mL IVPB     1 g 200 mL/hr over 30 Minutes Intravenous  Once 12/02/17 1522 12/02/17 1626   12/02/17 1530  azithromycin (ZITHROMAX) 500 mg in sodium chloride 0.9 % 250 mL IVPB     500 mg 250 mL/hr over 60 Minutes Intravenous  Once 12/02/17 1522 12/02/17 1759        Cultures:    Telemetry: No  DVT prophylaxis: Heparin   Objective: Vitals:   01/02/18 1422 01/02/18 2248 01/03/18 0500 01/03/18  1121  BP: 107/85 109/67  116/78  Pulse: 94 90  91  Resp: 16 14  14   Temp: 97.7 F (36.5 C) 97.7 F (36.5 C)  97.6 F (36.4 C)  TempSrc: Oral Oral  Oral  SpO2: 98% 99%  99%  Weight:   79.7 kg   Height:        Intake/Output Summary (Last 24 hours) at 01/03/2018 1301 Last data filed at 01/03/2018 7353 Gross per 24 hour  Intake 500 ml  Output -  Net 500 ml   Filed Weights   01/01/18 0500 01/02/18 0533 01/03/18 0500  Weight: 78 kg 79.7 kg 79.7 kg    Exam:  Constitutional:normal appearing female Eyes: EOMI, anicteric, normal conjunctivae ENMT: Oropharynx with moist mucous membranes, normal dentition Cardiovascular: RRR no MRGs, with no peripheral edema Respiratory: Normal respiratory  effort, clear breath sounds  Abdomen: Soft,non-tender, with no HSM Skin: No rash ulcers, or lesions. Without skin tenting, scar in right arm near inner elbow ( no redness or tenderness) Neurologic: Grossly no focal neuro deficit. Psychiatric:Appropriate affect, and mood. Mental status AAOx3  Data Reviewed: CBC: Recent Labs  Lab 12/31/17 0309 01/01/18 0414  WBC 8.9 6.9  HGB 8.9* 8.8*  HCT 29.7* 29.2*  MCV 90.3 91.8  PLT 491* 299*   Basic Metabolic Panel: Recent Labs  Lab 12/31/17 0309 01/01/18 0414 01/01/18 1117  NA 136 139 136  K 4.4 4.3 4.8  CL 99 100 102  CO2 28 27 24   GLUCOSE 109* 91 100*  BUN 19 18 16   CREATININE 0.62 0.55 0.55  CALCIUM 9.9 9.6 9.6  MG 1.6*  --   --    GFR: Estimated Creatinine Clearance: 108.6 mL/min (by C-G formula based on SCr of 0.55 mg/dL). Liver Function Tests: No results for input(s): AST, ALT, ALKPHOS, BILITOT, PROT, ALBUMIN in the last 168 hours. No results for input(s): LIPASE, AMYLASE in the last 168 hours. No results for input(s): AMMONIA in the last 168 hours. Coagulation Profile: No results for input(s): INR, PROTIME in the last 168 hours. Cardiac Enzymes: No results for input(s): CKTOTAL, CKMB, CKMBINDEX, TROPONINI in the last 168 hours. BNP (last 3 results) No results for input(s): PROBNP in the last 8760 hours. HbA1C: No results for input(s): HGBA1C in the last 72 hours. CBG: No results for input(s): GLUCAP in the last 168 hours. Lipid Profile: No results for input(s): CHOL, HDL, LDLCALC, TRIG, CHOLHDL, LDLDIRECT in the last 72 hours. Thyroid Function Tests: No results for input(s): TSH, T4TOTAL, FREET4, T3FREE, THYROIDAB in the last 72 hours. Anemia Panel: No results for input(s): VITAMINB12, FOLATE, FERRITIN, TIBC, IRON, RETICCTPCT in the last 72 hours. Urine analysis:    Component Value Date/Time   COLORURINE YELLOW 12/21/2017 0942   APPEARANCEUR CLEAR 12/21/2017 0942   APPEARANCEUR Hazy 07/10/2013 1121   LABSPEC  1.023 12/21/2017 0942   LABSPEC 1.020 07/10/2013 1121   PHURINE 5.0 12/21/2017 0942   GLUCOSEU NEGATIVE 12/21/2017 0942   GLUCOSEU Negative 07/10/2013 1121   HGBUR SMALL (A) 12/21/2017 0942   BILIRUBINUR NEGATIVE 12/21/2017 0942   BILIRUBINUR Negative 07/10/2013 1121   KETONESUR NEGATIVE 12/21/2017 0942   PROTEINUR NEGATIVE 12/21/2017 0942   UROBILINOGEN 1.0 02/15/2015 0530   NITRITE NEGATIVE 12/21/2017 0942   LEUKOCYTESUR MODERATE (A) 12/21/2017 0942   LEUKOCYTESUR Trace 07/10/2013 1121   Sepsis Labs: @LABRCNTIP (procalcitonin:4,lacticidven:4)  )No results found for this or any previous visit (from the past 240 hour(s)).    Studies: No results found.  Scheduled Meds: . acetaminophen  1,000 mg Oral Q8H  . bupivacaine liposome  20 mL Infiltration Once  . bupivacaine-EPINEPHrine  50 mL Infiltration Once  . Chlorhexidine Gluconate Cloth  6 each Topical Q2200  . cyclobenzaprine  10 mg Oral TID  . diclofenac sodium  2 g Topical QID  . docusate sodium  100 mg Oral BID  . fluticasone  2 spray Each Nare Daily  . guaiFENesin  1,200 mg Oral BID  . heparin  5,000 Units Subcutaneous Q8H  . lactobacillus  1 g Oral TID WC  . lidocaine  1 patch Transdermal Q24H  . magnesium oxide  400 mg Oral BID  . methadone  60 mg Oral Daily  . nicotine  21 mg Transdermal Daily  . nystatin cream   Topical BID  . pantoprazole  40 mg Oral Daily  . predniSONE  5 mg Oral Q breakfast  . senna-docusate  1 tablet Oral BID  . sodium chloride flush  10-40 mL Intracatheter Q12H  . tamsulosin  0.4 mg Oral Daily    Continuous Infusions: .  ceFAZolin (ANCEF) IV 2 g (01/03/18 0617)  . lactated ringers Stopped (12/26/17 0508)  . methocarbamol (ROBAXIN) IV Stopped (12/29/17 2335)     LOS: 32 days     [name], MD [facility] Pager [phone]  If 7PM-7AM, please contact night-coverage [email or web address] Password [email or web address] 01/03/2018, 1:01 PM

## 2018-01-04 NOTE — Progress Notes (Signed)
PROGRESS NOTE  Kelly [name] KVQ:[id number] DOB: Nov 19, 1985 DOA: 12/02/2017 PCP: Patient, No Pcp Per  HPI/Brief Narrative  Kelly [name] is a 32 y.o. 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  Subjective Reports adequate pain control.  Noticed increase scaling of the feet.  Assessment/Plan:  1. 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  2. Lumbar epidural abscess, status post lumbar laminectomy and drainage of biceps on 7/25.  MRI on 8/14 showed persistent discitis possible epidural soft thickening, neurosurgery at that time recommended no further intervention. Briefly needed Foley for urinary retention now resolved.  No new neurologic deficits does report some chronic left lower extremity weakness.  Continue Flomax  3. Severe tricuspid regurgitation.  In the setting of infectious endocarditis as mentioned above.  Cardiology recommends completing IV antibiotics repeating echo as outpatient to determine further need for intervention.  4. Adrenal insufficiency a.m. cortisol 3.4.  ACTH within normal range inappropriately.  Concern for secondary adrenal insufficiency.  Previous physician discussed with endocrinology on 8/19 who recommended steroids follow-up as outpatient.  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 prednisone 5 mg daily  5. Retained needle fragment.  X-ray confirms 8 mm needle fragment anterior to proximal humerus (prior surgery 05/2016, for retained needle with associated  infection).   No signs or symptoms of acute infection at site.  Spoke with [name] 8/23) who agrees given asymptomatic, location(not in joint) no need for surgical intervention  6. History of scoliosis status post previous surgery with chronic back pain.  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  Not confirmed on [place] prescriber database query based on previous physician because no recent prescription as mentioned below.  Per chart review patient was being given 80 mg of methadone at [name] clinic in [place] with last dose on October 25, 2017 she has been discharged from the clinic.  Patient started on methadone in hospital currently on 60 mg with improvement in pain control.  She will follow-up with clinic on discharge discharge (arranged by mother)  67. Polysubstance abuse. Patient follow with outpatient rehab post discharge.  Will take response ability to prescribe methadone.  8. Vaginal candidiasis, continue nystatin  9. Urinary retention, resolved stool incontinence. Briefly required Foley for urinary retention which is now resolved.  Continue Flomax, bladder scans..  Per neurosurgery should resolve in time  10. Chronic anemia, stable.  Hemoglobin stable at 8.8(8/21).  Continue to monitor  Code Status: Full code  Family Communication: No family at bedside  Disposition Plan: Continue IV cefazolin   Consultants:  ID, neurosurgery, cardiology     Procedures: Study Conclusions  - Left ventricle: The cavity size was normal. Systolic function was normal. The estimated ejection fraction was in the range of 55% to 60%. Wall motion was normal; there were no regional wall motion abnormalities. Left ventricular diastolic function parameters were normal. - Aortic valve: Transvalvular velocity was within the normal range. There was no stenosis. There was no regurgitation. - Mitral valve: Thickening of the anterior leaflet. Transvalvular velocity was within the normal  range. There was no evidence for stenosis. There was trivial regurgitation. - Left atrium: The atrium was mildly dilated. - Right ventricle: The cavity size was normal. Wall thickness was normal. Systolic function was normal. - Right atrium: The atrium was severely dilated. - Tricuspid valve: There was a medium-sized, 1.0 cm (W) x 1.7 cm (L) vegetation on the septal leaflet. There was severe regurgitation. - Pulmonary arteries: Systolic pressure was mildly increased. PA peak pressure: 42 mm Hg (S).  Impressions:  - Vegetation noted on the septal leaflet of the tricuspid valve consistent with endocarditis. Cannot exlude vegetation on the anterior leaflet of the mitral valve. Suggest TEE to better assess.  Antimicrobials: Anti-infectives (From admission, onward)   Start     Dose/Rate Route Frequency Ordered Stop   12/06/17 2200  ceFAZolin (ANCEF) IVPB 2g/100 mL premix     2 g 200 mL/hr over 30 Minutes Intravenous Every 8 hours 12/06/17 1016 01/29/18 2359   12/06/17 0600  vancomycin (VANCOCIN) IVPB 1000 mg/200 mL premix  Status:  Discontinued     1,000 mg 200 mL/hr over 60 Minutes Intravenous Every 8 hours 12/05/17 2316 12/06/17 1200   12/06/17 0000  cefTRIAXone (ROCEPHIN) 2 g in sodium chloride 0.9 % 100 mL IVPB  Status:  Discontinued     2 g 200 mL/hr over 30 Minutes Intravenous Every 24 hours 12/05/17 2238 12/06/17 1016   12/05/17 2300  vancomycin (VANCOCIN) 1,750 mg in sodium chloride 0.9 % 500 mL IVPB     1,750 mg 250 mL/hr over 120 Minutes Intravenous  Once 12/05/17 2246 12/06/17 0228   12/05/17 2055  vancomycin (VANCOCIN) powder  Status:  Discontinued       As needed 12/05/17 2055 12/05/17 2118   12/05/17 2001  bacitracin 50,000 Units in sodium chloride 0.9 % 500 mL irrigation  Status:  Discontinued       As needed 12/05/17 2002 12/05/17 2118   12/03/17 1600  azithromycin (ZITHROMAX) 500 mg in sodium chloride 0.9 % 250 mL IVPB  Status:  Discontinued      500 mg 250 mL/hr over 60 Minutes Intravenous Every 24 hours 12/02/17 1706 12/03/17 0948   12/03/17 1400  ceFAZolin (ANCEF) IVPB 2g/100 mL premix  Status:  Discontinued     2 g 200 mL/hr over 30 Minutes Intravenous Every 8 hours 12/03/17 0948 12/05/17 2238   12/03/17 1000  vancomycin (VANCOCIN) IVPB 750 mg/150 ml premix  Status:  Discontinued     750 mg 150 mL/hr over 60 Minutes Intravenous Every 12 hours 12/02/17 1710 12/03/17 0948   12/02/17 2200  piperacillin-tazobactam (ZOSYN) IVPB 3.375 g  Status:  Discontinued     3.375 g 12.5 mL/hr over 240 Minutes Intravenous Every 8 hours 12/02/17 1710 12/03/17 0948   12/02/17 1800  vancomycin (VANCOCIN) 1,500 mg in sodium chloride 0.9 % 500 mL IVPB     1,500 mg 250 mL/hr over 120 Minutes Intravenous  Once 12/02/17 1710 12/02/17 2110   12/02/17 1530  cefTRIAXone (ROCEPHIN) 1 g in sodium chloride 0.9 % 100 mL IVPB     1 g 200 mL/hr over 30 Minutes Intravenous  Once 12/02/17 1522 12/02/17 1626   12/02/17 1530  azithromycin (ZITHROMAX) 500 mg in sodium chloride 0.9 % 250 mL IVPB     500 mg 250 mL/hr over 60 Minutes Intravenous  Once 12/02/17 1522 12/02/17 1759        Cultures:  12/02/2017, blood cultures x2-MSSA  12/04/2017 blood cultures x2-no growth x5 days  12/05/2017, intraoperative  epidural abscess culture-MSSA (azithromycin-R, tetracycline-R)   Telemetry: No  DVT prophylaxis: Heparin   Objective: Vitals:   01/04/18 0210 01/04/18 0614 01/04/18 0812 01/04/18 1327  BP: 109/71 110/74  107/70  Pulse: 82 84  78  Resp: 17 16  16   Temp: (!) 97.5 F (36.4 C) 97.8 F (36.6 C)  97.7 F (36.5 C)  TempSrc: Oral Oral  Oral  SpO2: 99% 100%  99%  Weight:   79.8 kg   Height:        Intake/Output Summary (Last 24 hours) at 01/04/2018 1650 Last data filed at 01/04/2018 1430 Gross per 24 hour  Intake 1724.22 ml  Output -  Net 1724.22 ml   Filed Weights   01/02/18 0533 01/03/18 0500 01/04/18 0812  Weight: 79.7 kg 79.7 kg 79.8 kg     Exam:  Constitutional:normal appearing female Eyes: EOMI, anicteric, normal conjunctivae ENMT: Oropharynx with moist mucous membranes, normal dentition Cardiovascular: RRR with SEM loudest at right sternal border, with no peripheral edema Respiratory: Normal respiratory effort, clear breath sounds  Abdomen: Soft,non-tender,  Skin: No rash ulcers, or lesions. Without skin tenting, scar in right arm near inner elbow ( no redness or tenderness) Neurologic: Grossly no focal neuro deficit. Psychiatric:Appropriate affect, and mood. Mental status AAOx3  Data Reviewed: CBC: Recent Labs  Lab 12/31/17 0309 01/01/18 0414  WBC 8.9 6.9  HGB 8.9* 8.8*  HCT 29.7* 29.2*  MCV 90.3 91.8  PLT 491* 161*   Basic Metabolic Panel: Recent Labs  Lab 12/31/17 0309 01/01/18 0414 01/01/18 1117  NA 136 139 136  K 4.4 4.3 4.8  CL 99 100 102  CO2 28 27 24   GLUCOSE 109* 91 100*  BUN 19 18 16   CREATININE 0.62 0.55 0.55  CALCIUM 9.9 9.6 9.6  MG 1.6*  --   --    GFR: Estimated Creatinine Clearance: 108.8 mL/min (by C-G formula based on SCr of 0.55 mg/dL). Liver Function Tests: No results for input(s): AST, ALT, ALKPHOS, BILITOT, PROT, ALBUMIN in the last 168 hours. No results for input(s): LIPASE, AMYLASE in the last 168 hours. No results for input(s): AMMONIA in the last 168 hours. Coagulation Profile: No results for input(s): INR, PROTIME in the last 168 hours. Cardiac Enzymes: No results for input(s): CKTOTAL, CKMB, CKMBINDEX, TROPONINI in the last 168 hours. BNP (last 3 results) No results for input(s): PROBNP in the last 8760 hours. HbA1C: No results for input(s): HGBA1C in the last 72 hours. CBG: No results for input(s): GLUCAP in the last 168 hours. Lipid Profile: No results for input(s): CHOL, HDL, LDLCALC, TRIG, CHOLHDL, LDLDIRECT in the last 72 hours. Thyroid Function Tests: No results for input(s): TSH, T4TOTAL, FREET4, T3FREE, THYROIDAB in the last 72 hours. Anemia  Panel: No results for input(s): VITAMINB12, FOLATE, FERRITIN, TIBC, IRON, RETICCTPCT in the last 72 hours. Urine analysis:    Component Value Date/Time   COLORURINE YELLOW 12/21/2017 [place] 12/21/2017 0942   APPEARANCEUR Hazy 07/10/2013 1121   LABSPEC 1.023 12/21/2017 0942   LABSPEC 1.020 07/10/2013 1121   PHURINE 5.0 12/21/2017 0942   GLUCOSEU NEGATIVE 12/21/2017 0942   GLUCOSEU Negative 07/10/2013 1121   HGBUR SMALL (A) 12/21/2017 0942   BILIRUBINUR NEGATIVE 12/21/2017 0942   BILIRUBINUR Negative 07/10/2013 [place] 12/21/2017 0942   PROTEINUR NEGATIVE 12/21/2017 0942   UROBILINOGEN 1.0 02/15/2015 0530   NITRITE NEGATIVE 12/21/2017 0942   LEUKOCYTESUR MODERATE (A) 12/21/2017 0942   LEUKOCYTESUR Trace 07/10/2013 1121  Sepsis Labs: @LABRCNTIP (procalcitonin:4,lacticidven:4)  )No results found for this or any previous visit (from the past 240 hour(s)).    Studies: No results found.  Scheduled Meds: . acetaminophen  1,000 mg Oral Q8H  . bupivacaine liposome  20 mL Infiltration Once  . bupivacaine-EPINEPHrine  50 mL Infiltration Once  . Chlorhexidine Gluconate Cloth  6 each Topical Q2200  . cyclobenzaprine  10 mg Oral TID  . diclofenac sodium  2 g Topical QID  . docusate sodium  100 mg Oral BID  . fluticasone  2 spray Each Nare Daily  . guaiFENesin  1,200 mg Oral BID  . heparin  5,000 Units Subcutaneous Q8H  . lactobacillus  1 g Oral TID WC  . lidocaine  1 patch Transdermal Q24H  . magnesium oxide  400 mg Oral BID  . methadone  60 mg Oral Daily  . nicotine  21 mg Transdermal Daily  . nystatin cream   Topical BID  . pantoprazole  40 mg Oral Daily  . predniSONE  5 mg Oral Q breakfast  . senna-docusate  1 tablet Oral BID  . sodium chloride flush  10-40 mL Intracatheter Q12H  . tamsulosin  0.4 mg Oral Daily    Continuous Infusions: .  ceFAZolin (ANCEF) IV 2 g (01/04/18 1232)  . lactated ringers Stopped (12/26/17 0508)  .  methocarbamol (ROBAXIN) IV Stopped (12/29/17 2335)     LOS: 33 days     [name], MD [facility] Pager [phone]  If 7PM-7AM, please contact night-coverage [email or web address] Password [email or web address] 01/04/2018, 4:50 PM

## 2018-01-05 MED ORDER — FLEET ENEMA 7-19 GM/118ML RE ENEM
1.0000 | ENEMA | Freq: Every day | RECTAL | Status: DC | PRN
  Administered 2018-01-05 – 2018-01-21 (×2): 1 via RECTAL
  Filled 2018-01-05 (×2): qty 1

## 2018-01-05 NOTE — Progress Notes (Signed)
PROGRESS NOTE  Kelly [name] SEG:[id number] DOB: Nov 30, 1985 DOA: 12/02/2017 PCP: Patient, No Pcp Per  HPI/Brief Narrative  Kelly [name] is a 32 y.o. 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  Subjective Doing well. No BM in days  Assessment/Plan:  1. 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  2. Lumbar epidural abscess, status post lumbar laminectomy and drainage of biceps on 7/25.  MRI on 8/14 showed persistent discitis possible epidural soft thickening, neurosurgery at that time recommended no further intervention. Briefly needed Foley for urinary retention now resolved.  No new neurologic deficits does report some chronic left lower extremity weakness.  Continue Flomax  3. Severe tricuspid regurgitation.  In the setting of infectious endocarditis as mentioned above.  Cardiology recommends completing IV antibiotics repeating echo as outpatient to determine further need for intervention.  4. Adrenal insufficiency a.m. cortisol 3.4.  ACTH within normal range inappropriately.  Concern for secondary adrenal insufficiency.  Previous physician discussed with endocrinology on 8/19 who recommended steroids follow-up as outpatient.  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 prednisone 5 mg daily  5. Retained needle fragment.  X-ray confirms 8 mm needle fragment anterior to proximal humerus (prior surgery 05/2016, for retained needle with associated infection).   No signs or symptoms of acute infection  at site.  Spoke with [name] 8/23) who agrees given asymptomatic, location(not in joint) no need for surgical intervention  6. History of scoliosis status post previous surgery with chronic back pain.  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  Not confirmed on [facility] prescriber database query based on previous physician because no recent prescription as mentioned below.  Per chart review patient was being given 80 mg of methadone at [facility] clinic in [place] with last dose on October 25, 2017 she has been discharged from the clinic.  Patient started on methadone in hospital currently on 60 mg with improvement in pain control.  She will follow-up with clinic on discharge discharge (arranged by mother)  31. Polysubstance abuse. Patient follow with outpatient rehab post discharge.  Will take response ability to prescribe methadone.  8. Vaginal candidiasis, continue nystatin  9. Urinary retention, resolved stool incontinence. Briefly required Foley for urinary retention which is now resolved.  Continue Flomax, bladder scans..  Per neurosurgery should resolve in time  10. Chronic anemia, stable.  Hemoglobin stable at 8.8(8/21).  Continue to monitor  11. Constipation. Fleets enema pRN. monitor  Code Status: Full code  Family Communication: boyfriend at bedside  Disposition Plan: Continue IV cefazolin   Consultants:  ID, neurosurgery, cardiology     Procedures: Study Conclusions  - Left ventricle: The cavity size was normal. Systolic function was normal. The estimated ejection fraction was in the range of 55% to 60%. Wall motion was normal; there were no regional wall motion abnormalities. Left ventricular diastolic function parameters were normal. - Aortic valve: Transvalvular velocity was within the normal range. There was no stenosis. There was no regurgitation. - Mitral valve: Thickening of the anterior leaflet. Transvalvular velocity was within the normal  range.  There was no evidence for stenosis. There was trivial regurgitation. - Left atrium: The atrium was mildly dilated. - Right ventricle: The cavity size was normal. Wall thickness was normal. Systolic function was normal. - Right atrium: The atrium was severely dilated. - Tricuspid valve: There was a medium-sized, 1.0 cm (W) x 1.7 cm (L) vegetation on the septal leaflet. There was severe regurgitation. - Pulmonary arteries: Systolic pressure was mildly increased. PA peak pressure: 42 mm Hg (S).  Impressions:  - Vegetation noted on the septal leaflet of the tricuspid valve consistent with endocarditis. Cannot exlude vegetation on the anterior leaflet of the mitral valve. Suggest TEE to better assess.  Antimicrobials: Anti-infectives (From admission, onward)   Start     Dose/Rate Route Frequency Ordered Stop   12/06/17 2200  ceFAZolin (ANCEF) IVPB 2g/100 mL premix     2 g 200 mL/hr over 30 Minutes Intravenous Every 8 hours 12/06/17 1016 01/29/18 2359   12/06/17 0600  vancomycin (VANCOCIN) IVPB 1000 mg/200 mL premix  Status:  Discontinued     1,000 mg 200 mL/hr over 60 Minutes Intravenous Every 8 hours 12/05/17 2316 12/06/17 1200   12/06/17 0000  cefTRIAXone (ROCEPHIN) 2 g in sodium chloride 0.9 % 100 mL IVPB  Status:  Discontinued     2 g 200 mL/hr over 30 Minutes Intravenous Every 24 hours 12/05/17 2238 12/06/17 1016   12/05/17 2300  vancomycin (VANCOCIN) 1,750 mg in sodium chloride 0.9 % 500 mL IVPB     1,750 mg 250 mL/hr over 120 Minutes Intravenous  Once 12/05/17 2246 12/06/17 0228   12/05/17 2055  vancomycin (VANCOCIN) powder  Status:  Discontinued       As needed 12/05/17 2055 12/05/17 2118   12/05/17 2001  bacitracin 50,000 Units in sodium chloride 0.9 % 500 mL irrigation  Status:  Discontinued       As needed 12/05/17 2002 12/05/17 2118   12/03/17 1600  azithromycin (ZITHROMAX) 500 mg in sodium chloride 0.9 % 250 mL IVPB  Status:  Discontinued     500  mg 250 mL/hr over 60 Minutes Intravenous Every 24 hours 12/02/17 1706 12/03/17 0948   12/03/17 1400  ceFAZolin (ANCEF) IVPB 2g/100 mL premix  Status:  Discontinued     2 g 200 mL/hr over 30 Minutes Intravenous Every 8 hours 12/03/17 0948 12/05/17 2238   12/03/17 1000  vancomycin (VANCOCIN) IVPB 750 mg/150 ml premix  Status:  Discontinued     750 mg 150 mL/hr over 60 Minutes Intravenous Every 12 hours 12/02/17 1710 12/03/17 0948   12/02/17 2200  piperacillin-tazobactam (ZOSYN) IVPB 3.375 g  Status:  Discontinued     3.375 g 12.5 mL/hr over 240 Minutes Intravenous Every 8 hours 12/02/17 1710 12/03/17 0948   12/02/17 1800  vancomycin (VANCOCIN) 1,500 mg in sodium chloride 0.9 % 500 mL IVPB     1,500 mg 250 mL/hr over 120 Minutes Intravenous  Once 12/02/17 1710 12/02/17 2110   12/02/17 1530  cefTRIAXone (ROCEPHIN) 1 g in sodium chloride 0.9 % 100 mL IVPB     1 g 200 mL/hr over 30 Minutes Intravenous  Once 12/02/17 1522 12/02/17 1626   12/02/17 1530  azithromycin (ZITHROMAX) 500 mg in sodium chloride 0.9 % 250 mL IVPB     500 mg 250 mL/hr over 60 Minutes Intravenous  Once 12/02/17 1522 12/02/17 1759        Cultures:  12/02/2017, blood cultures x2-MSSA  12/04/2017 blood cultures x2-no growth x5 days  12/05/2017,  intraoperative epidural abscess culture-MSSA (azithromycin-R, tetracycline-R)   Telemetry: No  DVT prophylaxis: Heparin   Objective: Vitals:   01/04/18 1327 01/04/18 2232 01/05/18 0537 01/05/18 1550  BP: 107/70 104/68 104/88 117/71  Pulse: 78 80 83 86  Resp: 16 18 17 20   Temp: 97.7 F (36.5 C) 98.1 F (36.7 C) 98 F (36.7 C) 97.9 F (36.6 C)  TempSrc: Oral Oral Oral Oral  SpO2: 99% 100% 99% 100%  Weight:   79 kg   Height:        Intake/Output Summary (Last 24 hours) at 01/05/2018 1649 Last data filed at 01/05/2018 8144 Gross per 24 hour  Intake 220 ml  Output -  Net 220 ml   Filed Weights   01/03/18 0500 01/04/18 0812 01/05/18 0537  Weight: 79.7 kg 79.8  kg 79 kg    Exam:  Constitutional:normal appearing female Eyes: EOMI, anicteric, normal conjunctivae ENMT: Oropharynx with moist mucous membranes, normal dentition Cardiovascular: RRR with SEM loudest at right sternal border, with no peripheral edema Respiratory: Normal respiratory effort, clear breath sounds  Abdomen: Soft,non-tender,  Skin: No rash ulcers, or lesions. Without skin tenting, scar in right arm near inner elbow ( no redness or tenderness) Neurologic: Grossly no focal neuro deficit. Psychiatric:Appropriate affect, and mood. Mental status AAOx3  Data Reviewed: CBC: Recent Labs  Lab 12/31/17 0309 01/01/18 0414  WBC 8.9 6.9  HGB 8.9* 8.8*  HCT 29.7* 29.2*  MCV 90.3 91.8  PLT 491* 818*   Basic Metabolic Panel: Recent Labs  Lab 12/31/17 0309 01/01/18 0414 01/01/18 1117  NA 136 139 136  K 4.4 4.3 4.8  CL 99 100 102  CO2 28 27 24   GLUCOSE 109* 91 100*  BUN 19 18 16   CREATININE 0.62 0.55 0.55  CALCIUM 9.9 9.6 9.6  MG 1.6*  --   --    GFR: Estimated Creatinine Clearance: 108.3 mL/min (by C-G formula based on SCr of 0.55 mg/dL). Liver Function Tests: No results for input(s): AST, ALT, ALKPHOS, BILITOT, PROT, ALBUMIN in the last 168 hours. No results for input(s): LIPASE, AMYLASE in the last 168 hours. No results for input(s): AMMONIA in the last 168 hours. Coagulation Profile: No results for input(s): INR, PROTIME in the last 168 hours. Cardiac Enzymes: No results for input(s): CKTOTAL, CKMB, CKMBINDEX, TROPONINI in the last 168 hours. BNP (last 3 results) No results for input(s): PROBNP in the last 8760 hours. HbA1C: No results for input(s): HGBA1C in the last 72 hours. CBG: No results for input(s): GLUCAP in the last 168 hours. Lipid Profile: No results for input(s): CHOL, HDL, LDLCALC, TRIG, CHOLHDL, LDLDIRECT in the last 72 hours. Thyroid Function Tests: No results for input(s): TSH, T4TOTAL, FREET4, T3FREE, THYROIDAB in the last 72 hours. Anemia  Panel: No results for input(s): VITAMINB12, FOLATE, FERRITIN, TIBC, IRON, RETICCTPCT in the last 72 hours. Urine analysis:    Component Value Date/Time   COLORURINE YELLOW 12/21/2017 [place] 12/21/2017 0942   APPEARANCEUR [place] 07/10/2013 1121   LABSPEC 1.023 12/21/2017 0942   LABSPEC 1.020 07/10/2013 1121   PHURINE 5.0 12/21/2017 0942   GLUCOSEU NEGATIVE 12/21/2017 0942   GLUCOSEU Negative 07/10/2013 1121   HGBUR SMALL (A) 12/21/2017 0942   BILIRUBINUR NEGATIVE 12/21/2017 0942   BILIRUBINUR Negative 07/10/2013 [place] 12/21/2017 0942   PROTEINUR NEGATIVE 12/21/2017 0942   UROBILINOGEN 1.0 02/15/2015 0530   NITRITE NEGATIVE 12/21/2017 0942   LEUKOCYTESUR MODERATE (A) 12/21/2017 0942   LEUKOCYTESUR Trace 07/10/2013  1121   Sepsis Labs: @LABRCNTIP (procalcitonin:4,lacticidven:4)  )No results found for this or any previous visit (from the past 240 hour(s)).    Studies: No results found.  Scheduled Meds: . acetaminophen  1,000 mg Oral Q8H  . bupivacaine liposome  20 mL Infiltration Once  . bupivacaine-EPINEPHrine  50 mL Infiltration Once  . Chlorhexidine Gluconate Cloth  6 each Topical Q2200  . cyclobenzaprine  10 mg Oral TID  . diclofenac sodium  2 g Topical QID  . docusate sodium  100 mg Oral BID  . fluticasone  2 spray Each Nare Daily  . guaiFENesin  1,200 mg Oral BID  . heparin  5,000 Units Subcutaneous Q8H  . lactobacillus  1 g Oral TID WC  . lidocaine  1 patch Transdermal Q24H  . magnesium oxide  400 mg Oral BID  . methadone  60 mg Oral Daily  . nicotine  21 mg Transdermal Daily  . nystatin cream   Topical BID  . pantoprazole  40 mg Oral Daily  . predniSONE  5 mg Oral Q breakfast  . senna-docusate  1 tablet Oral BID  . sodium chloride flush  10-40 mL Intracatheter Q12H  . tamsulosin  0.4 mg Oral Daily    Continuous Infusions: .  ceFAZolin (ANCEF) IV 2 g (01/05/18 1314)  . lactated ringers Stopped (12/26/17 0508)  .  methocarbamol (ROBAXIN) IV Stopped (12/29/17 2335)     LOS: 34 days     [name], MD [facility] Pager [phone]  If 7PM-7AM, please contact night-coverage [email or web address] Password [facility] 01/05/2018, 4:49 PM

## 2018-01-06 LAB — BASIC METABOLIC PANEL
Anion gap: 7 (ref 5–15)
BUN: 21 mg/dL — ABNORMAL HIGH (ref 6–20)
CO2: 31 mmol/L (ref 22–32)
Calcium: 9.2 mg/dL (ref 8.9–10.3)
Chloride: 97 mmol/L — ABNORMAL LOW (ref 98–111)
Creatinine, Ser: 0.62 mg/dL (ref 0.44–1.00)
Glucose, Bld: 96 mg/dL (ref 70–99)
Potassium: 4.3 mmol/L (ref 3.5–5.1)
SODIUM: 135 mmol/L (ref 135–145)

## 2018-01-06 LAB — CBC
HCT: 29.2 % — ABNORMAL LOW (ref 36.0–46.0)
Hemoglobin: 8.8 g/dL — ABNORMAL LOW (ref 12.0–15.0)
MCH: 27.8 pg (ref 26.0–34.0)
MCHC: 30.1 g/dL (ref 30.0–36.0)
MCV: 92.1 fL (ref 78.0–100.0)
PLATELETS: 349 10*3/uL (ref 150–400)
RBC: 3.17 MIL/uL — AB (ref 3.87–5.11)
RDW: 15.8 % — ABNORMAL HIGH (ref 11.5–15.5)
WBC: 5.8 10*3/uL (ref 4.0–10.5)

## 2018-01-06 NOTE — Progress Notes (Signed)
Physical Therapy Treatment Patient Details Name: Kelly [name] MRN: [id number] DOB: 1985-08-01 Today's Date: 01/06/2018    History of Present Illness Kelly [name] is a 32 y.o. F with sepsis, tachycardia, tachypnea, leukocytosis, hypotension.  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 PMH of polysubstance abuse, ADD, scoliosis sp back surgery (20 years ago per mom). PICC placed 12/08/17.    PT Comments    Pt is progressing well with physical therapy. She ambulated full lap around unit with RW and min guard for safety. Pt continues to rely on RW for support secondary to balance deficits. Had pt perform standing balance activities at EOB. Plan to continue challenging pt's balance to maximize safety with mobility and functional independence. Considering pt's progress, d/c plans updated to HHPT. Pt will likely remain in hospital until 9/18 to complete IV antibiotics.    Follow Up Recommendations  Home health PT;Supervision for mobility/OOB     Equipment Recommendations  Rolling walker with 5" wheels;3in1 (PT)    Recommendations for Other Services       Precautions / Restrictions Precautions Precautions: Back;Fall Precaution Booklet Issued: No Restrictions Weight Bearing Restrictions: No    Mobility  Bed Mobility Overal bed mobility: Modified Independent Bed Mobility: Rolling;Sidelying to Sit;Sit to Sidelying Rolling: Modified independent (Device/Increase time) Sidelying to sit: Modified independent (Device/Increase time)       General bed mobility comments: increased time and effort to come to EOB. Pt using UEs to asssit LLE onto and off EOB.  Transfers Overall transfer level: Needs assistance Equipment used: Rolling walker (2 wheeled) Transfers: Sit to/from Stand Sit to Stand: Min guard         General transfer comment: min guard for safety as pt has some balance  deficits.  Ambulation/Gait Ambulation/Gait assistance: Min guard Gait Distance (Feet): 520 Feet Assistive device: Rolling walker (2 wheeled) Gait Pattern/deviations: Step-through pattern;Decreased stride length;Decreased dorsiflexion - left(L knee flexed) Gait velocity: Decreased   General Gait Details: Cues for decreased reliance on UE support. Pt able to ambulate with min guard for safety. Noted LLE remained flexed, secondary to weakness.    Stairs             Wheelchair Mobility    Modified Rankin (Stroke Patients Only)       Balance Overall balance assessment: Needs assistance Sitting-balance support: Feet supported;No upper extremity supported Sitting balance-Leahy Scale: Good Sitting balance - Comments: able to sit EOB independently w/o support   Standing balance support: Bilateral upper extremity supported;No upper extremity supported Standing balance-Leahy Scale: Fair Standing balance comment: able to tolerate standing w/o UE support for short periords               High Level Balance Comments: Performed Rhomberg stance w/ EO and EC. Able to progress after each attempt with eyes open till she could tolerate standing unsupported for 25 seconds. With eyes closed only able to tolerate 5 seconds w/o support            Cognition Arousal/Alertness: Awake/alert Behavior During Therapy: WFL for tasks assessed/performed Overall Cognitive Status: Within Functional Limits for tasks assessed                                        Exercises General Exercises - Lower Extremity Long Arc Quad: Seated;Strengthening;Left;5 reps(5 second holds)    General Comments  General comments (skin integrity, edema, etc.): donned socks while in bed      Pertinent Vitals/Pain Pain Assessment: Faces Faces Pain Scale: Hurts a little bit Pain Location: lower back (L side) Pain Descriptors / Indicators: Discomfort Pain Intervention(s): Monitored during  session;Limited activity within patient's tolerance;Repositioned    Home Living                      Prior Function            PT Goals (current goals can now be found in the care plan section) Acute Rehab PT Goals Patient Stated Goal: decrease pain and walk better PT Goal Formulation: With patient Time For Goal Achievement: 01/17/18 Potential to Achieve Goals: Good Progress towards PT goals: Progressing toward goals    Frequency    Min 3X/week      PT Plan Discharge plan needs to be updated    Co-evaluation              AM-PAC PT "6 Clicks" Daily Activity  Outcome Measure  Difficulty turning over in bed (including adjusting bedclothes, sheets and blankets)?: A Little Difficulty moving from lying on back to sitting on the side of the bed? : A Little Difficulty sitting down on and standing up from a chair with arms (e.g., wheelchair, bedside commode, etc,.)?: A Little Help needed moving to and from a bed to chair (including a wheelchair)?: A Little Help needed walking in hospital room?: A Little Help needed climbing 3-5 steps with a railing? : A Lot 6 Click Score: 17    End of Session Equipment Utilized During Treatment: Gait belt Activity Tolerance: Patient tolerated treatment well Patient left: with call bell/phone within reach;in bed(sitting EOB) Nurse Communication: Mobility status PT Visit Diagnosis: Unsteadiness on feet (R26.81);Other abnormalities of gait and mobility (R26.89);Muscle weakness (generalized) (M62.81);Difficulty in walking, not elsewhere classified (R26.2);Pain;Other symptoms and signs involving the nervous system (R29.898) Pain - Right/Left: (spine) Pain - part of body: (Back)     Time: 9728-2060 PT Time Calculation (min) (ACUTE ONLY): 26 min  Charges:  $Gait Training: 8-22 mins $Therapeutic Activity: 8-22 mins                     Benjiman Core, [place] Pager [phone] Acute Rehab   Allena Katz 01/06/2018, 2:37 PM

## 2018-01-06 NOTE — Progress Notes (Signed)
PROGRESS NOTE  Kelly [name]:[id number] DOB: 05/26/1985 DOA: 12/02/2017 PCP: Patient, No Pcp Per  HPI/Brief Narrative  Kelly [name] is a 32 y.o. 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  Subjective Sleepy this am. No complaints  Assessment/Plan:  1. 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  needs total of 8 weeks of IV cefazolin, end date 01/29/18 she may need oral treatment afterwards per ID.Marland KitchenWeekly CMP and CBC per ID( last drawn 8/26)  2. Lumbar epidural abscess, status post lumbar laminectomy and drainage  on 7/25.  MRI on 8/14 showed persistent discitis possible epidural soft thickening, neurosurgery at that time recommended no further intervention. Briefly needed Foley for urinary retention now resolved.  No new neurologic deficits does report some chronic left lower extremity weakness.  Ambulating well in halls. No longer requiring flomax  3. Severe tricuspid regurgitation.  In the setting of infectious endocarditis as mentioned above.  Cardiology recommends completing IV antibiotics repeating echo as outpatient to determine further need for intervention.  4. Adrenal insufficiency a.m. cortisol 3.4.  ACTH within normal range inappropriately.  Concern for secondary adrenal insufficiency.  Previous physician discussed with endocrinology on 8/19 who recommended steroids follow-up as outpatient.  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 prednisone 5 mg daily  5. Retained needle fragment.  X-ray confirms 8 mm needle fragment anterior to proximal humerus (prior surgery 05/2016, for retained  needle with associated infection).   No signs or symptoms of acute infection at site.  Spoke with [name] 8/23) who agrees given asymptomatic, location(not in joint) no need for surgical intervention  6. History of scoliosis status post previous surgery with chronic back pain.  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  Not confirmed on [facility] prescriber database query based on previous physician because no recent prescription as mentioned below.  Per chart review patient was being given 80 mg of methadone at [facility] clinic in [place] with last dose on October 25, 2017 she has been discharged from the clinic.  Patient started on methadone in hospital currently on 60 mg with improvement in pain control.  She will follow-up with clinic on discharge discharge (arranged by mother)  73. Polysubstance abuse. Patient follow with outpatient rehab post discharge.  Will take response ability to prescribe methadone.  8. Vaginal candidiasis, resolved. Discontinued nystatin  9. Urinary retention and stool incontinence, resolved. Briefly required Foley for urinary retention which is now resolved.  Discdontinue Flomax andbladder scans.    10. Chronic anemia, stable.  Hemoglobin stable at 8.8(8/26).  Continue to monitor  11. Constipation. Fleets enema pRN. monitor  Code Status: Full code  Family Communication: boyfriend at bedside  Disposition Plan: Continue IV cefazolin   Consultants:  ID, neurosurgery, cardiology     Procedures:  12/05/17: L3, L4, L5 and S1 laminectomy for drainage of lumbar epidural abscess   TTE: 12/03/17 Study Conclusions  - Left ventricle: The cavity size was normal. Systolic function was normal. The estimated ejection fraction was in the range of 55% to 60%. Wall motion was normal; there were no regional wall motion abnormalities. Left ventricular diastolic function parameters were normal. - Aortic valve:  Transvalvular velocity was within the normal  range. There was no stenosis. There was no regurgitation. - Mitral valve: Thickening of the anterior leaflet. Transvalvular velocity was within the normal range. There was no evidence for stenosis. There was trivial regurgitation. - Left atrium: The atrium was mildly dilated. - Right ventricle: The cavity size was normal. Wall thickness was normal. Systolic function was normal. - Right atrium: The atrium was severely dilated. - Tricuspid valve: There was a medium-sized, 1.0 cm (W) x 1.7 cm (L) vegetation on the septal leaflet. There was severe regurgitation. - Pulmonary arteries: Systolic pressure was mildly increased. PA peak pressure: 42 mm Hg (S).  Impressions:  - Vegetation noted on the septal leaflet of the tricuspid valve consistent with endocarditis. Cannot exlude vegetation on the anterior leaflet of the mitral valve. Suggest TEE to better assess.  Antimicrobials: Anti-infectives (From admission, onward)   Start     Dose/Rate Route Frequency Ordered Stop   12/06/17 2200  ceFAZolin (ANCEF) IVPB 2g/100 mL premix     2 g 200 mL/hr over 30 Minutes Intravenous Every 8 hours 12/06/17 1016 01/29/18 2359   12/06/17 0600  vancomycin (VANCOCIN) IVPB 1000 mg/200 mL premix  Status:  Discontinued     1,000 mg 200 mL/hr over 60 Minutes Intravenous Every 8 hours 12/05/17 2316 12/06/17 1200   12/06/17 0000  cefTRIAXone (ROCEPHIN) 2 g in sodium chloride 0.9 % 100 mL IVPB  Status:  Discontinued     2 g 200 mL/hr over 30 Minutes Intravenous Every 24 hours 12/05/17 2238 12/06/17 1016   12/05/17 2300  vancomycin (VANCOCIN) 1,750 mg in sodium chloride 0.9 % 500 mL IVPB     1,750 mg 250 mL/hr over 120 Minutes Intravenous  Once 12/05/17 2246 12/06/17 0228   12/05/17 2055  vancomycin (VANCOCIN) powder  Status:  Discontinued       As needed 12/05/17 2055 12/05/17 2118   12/05/17 2001  bacitracin 50,000 Units in sodium chloride 0.9 % 500 mL irrigation  Status:  Discontinued        As needed 12/05/17 2002 12/05/17 2118   12/03/17 1600  azithromycin (ZITHROMAX) 500 mg in sodium chloride 0.9 % 250 mL IVPB  Status:  Discontinued     500 mg 250 mL/hr over 60 Minutes Intravenous Every 24 hours 12/02/17 1706 12/03/17 0948   12/03/17 1400  ceFAZolin (ANCEF) IVPB 2g/100 mL premix  Status:  Discontinued     2 g 200 mL/hr over 30 Minutes Intravenous Every 8 hours 12/03/17 0948 12/05/17 2238   12/03/17 1000  vancomycin (VANCOCIN) IVPB 750 mg/150 ml premix  Status:  Discontinued     750 mg 150 mL/hr over 60 Minutes Intravenous Every 12 hours 12/02/17 1710 12/03/17 0948   12/02/17 2200  piperacillin-tazobactam (ZOSYN) IVPB 3.375 g  Status:  Discontinued     3.375 g 12.5 mL/hr over 240 Minutes Intravenous Every 8 hours 12/02/17 1710 12/03/17 0948   12/02/17 1800  vancomycin (VANCOCIN) 1,500 mg in sodium chloride 0.9 % 500 mL IVPB     1,500 mg 250 mL/hr over 120 Minutes Intravenous  Once 12/02/17 1710 12/02/17 2110   12/02/17 1530  cefTRIAXone (ROCEPHIN) 1 g in sodium chloride 0.9 % 100 mL IVPB     1 g 200 mL/hr over 30 Minutes Intravenous  Once 12/02/17 1522 12/02/17 1626   12/02/17 1530  azithromycin (ZITHROMAX) 500 mg in sodium chloride 0.9 % 250 mL IVPB     500 mg 250 mL/hr over 60 Minutes Intravenous  Once 12/02/17 1522 12/02/17 1759        Cultures:  12/02/2017, blood cultures x2-MSSA  12/04/2017 blood cultures x2-no growth x5 days  12/05/2017, intraoperative epidural abscess culture-MSSA (azithromycin-R, tetracycline-R)   Telemetry: No  DVT prophylaxis: Heparin   Objective: Vitals:   01/05/18 0537 01/05/18 1550 01/05/18 2141 01/06/18 0402  BP: 104/88 117/71 110/66 106/74  Pulse: 83 86 92 85  Resp: 17 20 16 16   Temp: 98 F (36.7 C) 97.9 F (36.6 C) 98.2 F (36.8 C) 97.7 F (36.5 C)  TempSrc: Oral Oral Oral Oral  SpO2: 99% 100% 100% 99%  Weight: 79 kg     Height:        Intake/Output Summary (Last 24 hours) at 01/06/2018 1251 Last data filed  at 01/06/2018 0900 Gross per 24 hour  Intake 1560 ml  Output -  Net 1560 ml   Filed Weights   01/03/18 0500 01/04/18 0812 01/05/18 0537  Weight: 79.7 kg 79.8 kg 79 kg    Exam:  Constitutional:normal appearing female Eyes: EOMI, anicteric, normal conjunctivae ENMT: Oropharynx with moist mucous membranes, normal dentition Cardiovascular: RRR with SEM loudest at right sternal border, with no peripheral edema Respiratory: Normal respiratory effort, clear breath sounds  Abdomen: Soft,non-tender,  Skin: No rash ulcers, or lesions. Without skin tenting, scar in right arm near inner elbow ( no redness or tenderness) Neurologic: Grossly no focal neuro deficit. Psychiatric:Appropriate affect, and mood. Mental status AAOx3  Data Reviewed: CBC: Recent Labs  Lab 12/31/17 0309 01/01/18 0414 01/06/18 0344  WBC 8.9 6.9 5.8  HGB 8.9* 8.8* 8.8*  HCT 29.7* 29.2* 29.2*  MCV 90.3 91.8 92.1  PLT 491* 484* 370   Basic Metabolic Panel: Recent Labs  Lab 12/31/17 0309 01/01/18 0414 01/01/18 1117 01/06/18 0344  NA 136 139 136 135  K 4.4 4.3 4.8 4.3  CL 99 100 102 97*  CO2 28 27 24 31   GLUCOSE 109* 91 100* 96  BUN 19 18 16  21*  CREATININE 0.62 0.55 0.55 0.62  CALCIUM 9.9 9.6 9.6 9.2  MG 1.6*  --   --   --    GFR: Estimated Creatinine Clearance: 108.3 mL/min (by C-G formula based on SCr of 0.62 mg/dL). Liver Function Tests: No results for input(s): AST, ALT, ALKPHOS, BILITOT, PROT, ALBUMIN in the last 168 hours. No results for input(s): LIPASE, AMYLASE in the last 168 hours. No results for input(s): AMMONIA in the last 168 hours. Coagulation Profile: No results for input(s): INR, PROTIME in the last 168 hours. Cardiac Enzymes: No results for input(s): CKTOTAL, CKMB, CKMBINDEX, TROPONINI in the last 168 hours. BNP (last 3 results) No results for input(s): PROBNP in the last 8760 hours. HbA1C: No results for input(s): HGBA1C in the last 72 hours. CBG: No results for input(s):  GLUCAP in the last 168 hours. Lipid Profile: No results for input(s): CHOL, HDL, LDLCALC, TRIG, CHOLHDL, LDLDIRECT in the last 72 hours. Thyroid Function Tests: No results for input(s): TSH, T4TOTAL, FREET4, T3FREE, THYROIDAB in the last 72 hours. Anemia Panel: No results for input(s): VITAMINB12, FOLATE, FERRITIN, TIBC, IRON, RETICCTPCT in the last 72 hours. Urine analysis:    Component Value Date/Time   COLORURINE YELLOW 12/21/2017 [place] 12/21/2017 0942   APPEARANCEUR Hazy 07/10/2013 1121   LABSPEC 1.023 12/21/2017 0942   LABSPEC 1.020 07/10/2013 1121   PHURINE 5.0 12/21/2017 0942   GLUCOSEU NEGATIVE 12/21/2017 0942   GLUCOSEU Negative 07/10/2013 1121   HGBUR SMALL (A) 12/21/2017 4888  BILIRUBINUR NEGATIVE 12/21/2017 0942   BILIRUBINUR Negative 07/10/2013 1121   [place] 12/21/2017 0942   PROTEINUR NEGATIVE 12/21/2017 0942   UROBILINOGEN 1.0 02/15/2015 0530   NITRITE NEGATIVE 12/21/2017 0942   LEUKOCYTESUR MODERATE (A) 12/21/2017 0942   LEUKOCYTESUR Trace 07/10/2013 1121   Sepsis Labs: @LABRCNTIP (procalcitonin:4,lacticidven:4)  )No results found for this or any previous visit (from the past 240 hour(s)).    Studies: No results found.  Scheduled Meds: . acetaminophen  1,000 mg Oral Q8H  . bupivacaine liposome  20 mL Infiltration Once  . bupivacaine-EPINEPHrine  50 mL Infiltration Once  . Chlorhexidine Gluconate Cloth  6 each Topical Q2200  . cyclobenzaprine  10 mg Oral TID  . diclofenac sodium  2 g Topical QID  . docusate sodium  100 mg Oral BID  . fluticasone  2 spray Each Nare Daily  . heparin  5,000 Units Subcutaneous Q8H  . lactobacillus  1 g Oral TID WC  . lidocaine  1 patch Transdermal Q24H  . magnesium oxide  400 mg Oral BID  . methadone  60 mg Oral Daily  . nicotine  21 mg Transdermal Daily  . pantoprazole  40 mg Oral Daily  . predniSONE  5 mg Oral Q breakfast  . senna-docusate  1 tablet Oral BID  . sodium chloride flush   10-40 mL Intracatheter Q12H  . tamsulosin  0.4 mg Oral Daily    Continuous Infusions: .  ceFAZolin (ANCEF) IV 2 g (01/06/18 0553)  . lactated ringers 10 mL/hr at 01/06/18 0359  . methocarbamol (ROBAXIN) IV Stopped (12/29/17 2335)     LOS: 35 days     [name], MD [facility] Pager [phone]  If 7PM-7AM, please contact night-coverage [email or web address] Password [facility] 01/06/2018, 12:51 PM

## 2018-01-07 MED ORDER — POLYETHYLENE GLYCOL 3350 17 G PO PACK
17.0000 g | PACK | Freq: Two times a day (BID) | ORAL | Status: DC
  Administered 2018-01-07 – 2018-01-29 (×20): 17 g via ORAL
  Filled 2018-01-07 (×38): qty 1

## 2018-01-07 NOTE — Progress Notes (Addendum)
Occupational Therapy Treatment Patient Details Name: Kelly [name] MRN: [id number] DOB: Dec 04, 1985 Today's Date: 01/07/2018    History of present illness Kelly [name] is a 32 y.o. F with sepsis, tachycardia, tachypnea, leukocytosis, hypotension.  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 PMH of polysubstance abuse, ADD, scoliosis sp back surgery (20 years ago per mom). PICC placed 12/08/17.   OT comments  Pt demonstrating good progress toward OT goals. Majority of most recent goals met and updated in care plans section. Pt able to complete ambulation to bathroom with RW and supervision this session. Facilitated improved dynamic balance with sit<>stands without RW as well as standing grooming tasks without UE support with pt requiring min assist for balance at times. She was able to access feet for LB ADL with supervision this session as well. Pt continues to demonstrate instability and is concerned about accessing her significant other's second story apartment post-acute D/C.    Follow Up Recommendations  CIR;Supervision/Assistance - 24 hour    Equipment Recommendations  3 in 1 bedside commode;Wheelchair (measurements OT);Wheelchair cushion (measurements OT)    Recommendations for Other Services Rehab consult    Precautions / Restrictions Precautions Precautions: Back;Fall Precaution Booklet Issued: No Restrictions Weight Bearing Restrictions: No       Mobility Bed Mobility               General bed mobility comments: seated at EOB on my arrival  Transfers Overall transfer level: Needs assistance Equipment used: Rolling walker (2 wheeled) Transfers: Sit to/from Stand Sit to Stand: Supervision;Min assist         General transfer comment: Min guard assist without RW to stand at EOB. Supervision with RW.     Balance Overall balance assessment: Needs assistance Sitting-balance  support: Feet supported;No upper extremity supported Sitting balance-Leahy Scale: Good     Standing balance support: Bilateral upper extremity supported;No upper extremity supported Standing balance-Leahy Scale: Fair Standing balance comment: Standing without UE support statically without assistance. Able to complete standing tasks with up to min assist during dynamic tasks.                            ADL either performed or assessed with clinical judgement   ADL Overall ADL's : Needs assistance/impaired Eating/Feeding: Set up;Bed level   Grooming: Standing;Minimal assistance Grooming Details (indicate cue type and reason): standing to comb hair; she does have some instability with posterior LOB requiring min assist to correct when not maintaining UE support     Lower Body Bathing: Supervison/ safety;Sitting/lateral leans           Toilet Transfer: Ambulation;RW;Supervision/safety Toilet Transfer Details (indicate cue type and reason): Supervision with RW. Able to take a few steps without RW with moderate assistance.  Toileting- Clothing Manipulation and Hygiene: Supervision/safety;Sitting/lateral lean       Functional mobility during ADLs: Supervision/safety;Min guard;Rolling walker General ADL Comments: Pt continues to demonstrate decreased stability on her feet throughout session but able to complete mobility in room and hallway with RW with supervision. Pt requiring min assist to take steps without RW and min assist without UE support to complete standing grooming tasks.      Vision       Perception     Praxis      Cognition Arousal/Alertness: Awake/alert Behavior During Therapy: WFL for tasks assessed/performed Overall Cognitive Status: Within Functional Limits for tasks assessed  Exercises Exercises: Other exercises Other Exercises Other Exercises: Facilitated improved activity tolerance for  ADL participation with functional mobility in hallway.    Shoulder Instructions       General Comments      Pertinent Vitals/ Pain       Pain Assessment: Faces Faces Pain Scale: Hurts a little bit Pain Location: Lower back Pain Descriptors / Indicators: Discomfort Pain Intervention(s): Monitored during session;Repositioned  Home Living                                          Prior Functioning/Environment              Frequency  Min 2X/week        Progress Toward Goals  OT Goals(current goals can now be found in the care plan section)  Progress towards OT goals: Progressing toward goals;Goals met and updated - see care plan(some goals met and updated)  Acute Rehab OT Goals Patient Stated Goal: decrease pain and walk better OT Goal Formulation: With patient Time For Goal Achievement: 01/21/18 Potential to Achieve Goals: Good ADL Goals Pt Will Perform Grooming: Independently;standing Pt Will Perform Lower Body Bathing: Independently;sit to/from stand Pt Will Perform Lower Body Dressing: Independently;sit to/from stand Pt Will Transfer to Toilet: Independently;ambulating;regular height toilet Pt Will Perform Toileting - Clothing Manipulation and hygiene: Independently;sit to/from stand Pt Will Perform Tub/Shower Transfer: with modified independence;ambulating;3 in 1;rolling walker  Plan Frequency remains appropriate;Discharge plan remains appropriate    Co-evaluation                 AM-PAC PT "6 Clicks" Daily Activity     Outcome Measure   Help from another person eating meals?: None Help from another person taking care of personal grooming?: A Little Help from another person toileting, which includes using toliet, bedpan, or urinal?: A Little Help from another person bathing (including washing, rinsing, drying)?: A Little Help from another person to put on and taking off regular upper body clothing?: A Little Help from another person  to put on and taking off regular lower body clothing?: A Little 6 Click Score: 19    End of Session Equipment Utilized During Treatment: Gait belt;Rolling walker  OT Visit Diagnosis: Unsteadiness on feet (R26.81);Other abnormalities of gait and mobility (R26.89);Pain Pain - Right/Left: Left Pain - part of body: (back)   Activity Tolerance Patient tolerated treatment well   Patient Left with call bell/phone within reach;in bed(seated at EOB)   Nurse Communication Mobility status        Time: 4827-0786 OT Time Calculation (min): 33 min  Charges: OT General Charges $OT Visit: 1 Visit OT Treatments $Self Care/Home Management : 8-22 mins $Therapeutic Activity: 8-22 mins  Norman Herrlich, MS OTR/L  Pager: [place] [facility] Giambalvo 01/07/2018, 4:22 PM

## 2018-01-07 NOTE — Progress Notes (Signed)
IV TEAM: Routine line care visit, pt reports intermittent burning at insertion site. Dressing and antimicrobial disc intact. No erythema or drainage noted. Brisk blood return noted, flushes easily. Instructed pt to notify RN if burning persists. She voiced understanding. RN made aware.

## 2018-01-07 NOTE — Progress Notes (Signed)
Physical Therapy Treatment Patient Details Name: Kelly [name] MRN: [id number] DOB: 05-09-1986 Today's Date: 01/07/2018    History of Present Illness Kelly [name] is a 32 y.o. F with sepsis, tachycardia, tachypnea, leukocytosis, hypotension.  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 PMH of polysubstance abuse, ADD, scoliosis sp back surgery (20 years ago per mom). PICC placed 12/08/17.    PT Comments    Pt would like for [place] to re-consult inpatient rehab to see if she would be a candidate before she d/c home.  She has at least a flight (at [employer]) at most two flights (if home with boyfriend) at discharge and has a goal to walk without the RW.  Right now she is walking a significant distance with heavy reliance on the RW for support of her weak legs.  I would anticipate it would take two person mod assist to walk without the RW (may be good to try and see next session).  I have contacted the CIR admission coordinator to re assess the chart and see if a full consult is warranted.    Follow Up Recommendations  Supervision for mobility/OOB;Other (comment)(I would like CIR to re-assess)     Equipment Recommendations  Rolling walker with 5" wheels;3in1 (PT)    Recommendations for Other Services Rehab consult     Precautions / Restrictions Precautions Precautions: Back;Fall Precaution Booklet Issued: No Restrictions Weight Bearing Restrictions: No    Mobility  Bed Mobility               General bed mobility comments: PT found pt walking in hallway with RN tech and son.   Transfers Overall transfer level: Needs assistance Equipment used: Rolling walker (2 wheeled) Transfers: Sit to/from Stand Sit to Stand: Supervision;Min guard         General transfer comment: Pt needs supervision to min guard assist for balance during transitions, and closer to min guard as we practiced 5 times  sit to stand.   Ambulation/Gait Ambulation/Gait assistance: Min guard Gait Distance (Feet): 300 Feet Assistive device: Rolling walker (2 wheeled) Gait Pattern/deviations: Step-through pattern;Ataxic(flexed knees) Gait velocity: Decreased Gait velocity interpretation: 1.31 - 2.62 ft/sec, indicative of limited community ambulator General Gait Details: Pt with flexed knee gait pattern, as we attempted to try to change to more normal heel to toe pattern some of her strength deficits emerged especially in her left leg with hyper extension moment in terminal stance just before heel off.    Stairs Stairs: Yes Stairs assistance: Min assist Stair Management: One rail Left;Step to pattern;Sideways Number of Stairs: 2(limited by IV line and strength) General stair comments: Pt able to make it up two steps sideways with min assist to help power up steps leading with her stronger right leg first.           Balance Overall balance assessment: Needs assistance Sitting-balance support: No upper extremity supported;Feet supported Sitting balance-Leahy Scale: Good     Standing balance support: Bilateral upper extremity supported;No upper extremity supported;Single extremity supported Standing balance-Leahy Scale: Fair Standing balance comment: Standing without UE support statically without assistance. Able to complete standing tasks with up to min assist during dynamic tasks.                             Cognition Arousal/Alertness: Awake/alert Behavior During Therapy: WFL for tasks assessed/performed Overall Cognitive Status: Within Functional Limits  for tasks assessed                                        Exercises Other Exercises Other Exercises: 5 times sit to stand encouraged as a good exercise to do throughout the day (with supervision only as she does get a bit shakey at the end) 28.83 second to complete.         Pertinent Vitals/Pain Pain Assessment:  0-10 Pain Score: 7  Faces Pain Scale: Hurts a little bit Pain Location: Lower back Pain Descriptors / Indicators: Grimacing;Guarding Pain Intervention(s): Limited activity within patient's tolerance;Monitored during session;Repositioned;RN gave pain meds during session           PT Goals (current goals can now be found in the care plan section) Acute Rehab PT Goals Patient Stated Goal: go through intensive therapy before going home.  Progress towards PT goals: Progressing toward goals    Frequency    Min 3X/week      PT Plan Current plan remains appropriate       AM-PAC PT "6 Clicks" Daily Activity  Outcome Measure  Difficulty turning over in bed (including adjusting bedclothes, sheets and blankets)?: A Little Difficulty moving from lying on back to sitting on the side of the bed? : A Little Difficulty sitting down on and standing up from a chair with arms (e.g., wheelchair, bedside commode, etc,.)?: Unable Help needed moving to and from a bed to chair (including a wheelchair)?: A Little Help needed walking in hospital room?: A Little Help needed climbing 3-5 steps with a railing? : A Little 6 Click Score: 16    End of Session Equipment Utilized During Treatment: Gait belt Activity Tolerance: Patient limited by fatigue;Patient limited by pain Patient left: in bed;with call bell/phone within reach;with family/visitor present(seated EOB)   PT Visit Diagnosis: Unsteadiness on feet (R26.81);Other abnormalities of gait and mobility (R26.89);Muscle weakness (generalized) (M62.81);Difficulty in walking, not elsewhere classified (R26.2);Pain;Other symptoms and signs involving the nervous system (R29.898) Pain - Right/Left: (incisional) Pain - part of body: (back)     Time: 2863-8177 PT Time Calculation (min) (ACUTE ONLY): 28 min  Charges:  $Gait Training: 23-37 mins                    Jaycey Gens B. [place], [place], DPT [phone]    01/07/2018, 5:57 PM

## 2018-01-07 NOTE — Progress Notes (Signed)
PROGRESS NOTE  Kelly [name]:[id number] DOB: 1986/03/29 DOA: 12/02/2017 PCP: Patient, No Pcp Per  HPI/Brief Narrative  Kelly [name] is a 32 y.o. 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  Subjective Doing well. Ambulating in hall. Having difficulty with BM, soft, but feels like she's "crowning"  Assessment/Plan:  1. 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  needs total of 8 weeks of IV cefazolin, end date 01/29/18 she may need oral treatment afterwards per ID.Marland KitchenWeekly CMP and CBC per ID( last drawn 8/26)  2. Lumbar epidural abscess, status post lumbar laminectomy and drainage  on 7/25.  MRI on 8/14 showed persistent discitis possible epidural soft thickening, neurosurgery at that time recommended no further intervention. Briefly needed Foley for urinary retention now resolved.  No new neurologic deficits does report some chronic left lower extremity weakness.  Ambulating well in halls. No longer requiring flomax  3. Severe tricuspid regurgitation.  In the setting of infectious endocarditis as mentioned above.  Cardiology recommends completing IV antibiotics repeating echo as outpatient to determine further need for intervention.  4. Adrenal insufficiency a.m. cortisol 3.4.  ACTH within normal range inappropriately.  Concern for secondary adrenal insufficiency.  Previous physician discussed with endocrinology on 8/19 who recommended steroids follow-up as outpatient.  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 prednisone 5 mg daily  5. Retained needle fragment.  X-ray confirms 8 mm needle  fragment anterior to proximal humerus (prior surgery 05/2016, for retained needle with associated infection).   No signs or symptoms of acute infection at site.  Spoke with [name] 8/23) who agrees given asymptomatic, location(not in joint) no need for surgical intervention  6. History of scoliosis status post previous surgery with chronic back pain.  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  Not confirmed on [place] prescriber database query based on previous physician because no recent prescription as mentioned below.  Per chart review patient was being given 80 mg of methadone at [facility] clinic in [place] with last dose on October 25, 2017 she has been discharged from the clinic.  Patient started on methadone in hospital currently on 60 mg with improvement in pain control.  She will follow-up with clinic on discharge discharge (arranged by mother)  57. Polysubstance abuse. Patient follow with outpatient rehab post discharge.  Will take response ability to prescribe methadone.  8. Vaginal candidiasis, resolved. Discontinued nystatin  9. Urinary retention and stool incontinence, resolved. Briefly required Foley for urinary retention which is now resolved.  Discdontinue Flomax andbladder scans.    10. Chronic anemia, stable.  Hemoglobin stable at 8.8(8/26).  Continue to monitor  11. Constipation. Fleets enema pRN. Added scheduled miralax BID. monitor  Code Status: Full code  Family Communication: son accompanying her on walk  Disposition Plan: Continue IV cefazolin   Consultants:  ID, neurosurgery, cardiology     Procedures:  12/05/17: L3, L4, L5 and S1 laminectomy for drainage of lumbar epidural abscess   TTE: 12/03/17 Study Conclusions  - Left ventricle: The cavity size was normal. Systolic function was normal. The estimated ejection fraction was in the range of 55% to 60%. Wall motion was normal; there  were no regional wall motion abnormalities. Left ventricular  diastolic function parameters were normal. - Aortic valve: Transvalvular velocity was within the normal range. There was no stenosis. There was no regurgitation. - Mitral valve: Thickening of the anterior leaflet. Transvalvular velocity was within the normal range. There was no evidence for stenosis. There was trivial regurgitation. - Left atrium: The atrium was mildly dilated. - Right ventricle: The cavity size was normal. Wall thickness was normal. Systolic function was normal. - Right atrium: The atrium was severely dilated. - Tricuspid valve: There was a medium-sized, 1.0 cm (W) x 1.7 cm (L) vegetation on the septal leaflet. There was severe regurgitation. - Pulmonary arteries: Systolic pressure was mildly increased. PA peak pressure: 42 mm Hg (S).  Impressions:  - Vegetation noted on the septal leaflet of the tricuspid valve consistent with endocarditis. Cannot exlude vegetation on the anterior leaflet of the mitral valve. Suggest TEE to better assess.  Antimicrobials: Anti-infectives (From admission, onward)   Start     Dose/Rate Route Frequency Ordered Stop   12/06/17 2200  ceFAZolin (ANCEF) IVPB 2g/100 mL premix     2 g 200 mL/hr over 30 Minutes Intravenous Every 8 hours 12/06/17 1016 01/29/18 2359   12/06/17 0600  vancomycin (VANCOCIN) IVPB 1000 mg/200 mL premix  Status:  Discontinued     1,000 mg 200 mL/hr over 60 Minutes Intravenous Every 8 hours 12/05/17 2316 12/06/17 1200   12/06/17 0000  cefTRIAXone (ROCEPHIN) 2 g in sodium chloride 0.9 % 100 mL IVPB  Status:  Discontinued     2 g 200 mL/hr over 30 Minutes Intravenous Every 24 hours 12/05/17 2238 12/06/17 1016   12/05/17 2300  vancomycin (VANCOCIN) 1,750 mg in sodium chloride 0.9 % 500 mL IVPB     1,750 mg 250 mL/hr over 120 Minutes Intravenous  Once 12/05/17 2246 12/06/17 0228   12/05/17 2055  vancomycin (VANCOCIN) powder  Status:  Discontinued       As needed 12/05/17 2055 12/05/17 2118     12/05/17 2001  bacitracin 50,000 Units in sodium chloride 0.9 % 500 mL irrigation  Status:  Discontinued       As needed 12/05/17 2002 12/05/17 2118   12/03/17 1600  azithromycin (ZITHROMAX) 500 mg in sodium chloride 0.9 % 250 mL IVPB  Status:  Discontinued     500 mg 250 mL/hr over 60 Minutes Intravenous Every 24 hours 12/02/17 1706 12/03/17 0948   12/03/17 1400  ceFAZolin (ANCEF) IVPB 2g/100 mL premix  Status:  Discontinued     2 g 200 mL/hr over 30 Minutes Intravenous Every 8 hours 12/03/17 0948 12/05/17 2238   12/03/17 1000  vancomycin (VANCOCIN) IVPB 750 mg/150 ml premix  Status:  Discontinued     750 mg 150 mL/hr over 60 Minutes Intravenous Every 12 hours 12/02/17 1710 12/03/17 0948   12/02/17 2200  piperacillin-tazobactam (ZOSYN) IVPB 3.375 g  Status:  Discontinued     3.375 g 12.5 mL/hr over 240 Minutes Intravenous Every 8 hours 12/02/17 1710 12/03/17 0948   12/02/17 1800  vancomycin (VANCOCIN) 1,500 mg in sodium chloride 0.9 % 500 mL IVPB     1,500 mg 250 mL/hr over 120 Minutes Intravenous  Once 12/02/17 1710 12/02/17 2110   12/02/17 1530  cefTRIAXone (ROCEPHIN) 1 g in sodium chloride 0.9 % 100 mL IVPB     1 g 200 mL/hr over 30 Minutes Intravenous  Once 12/02/17 1522 12/02/17 1626   12/02/17 1530  azithromycin (ZITHROMAX) 500 mg in sodium chloride  0.9 % 250 mL IVPB     500 mg 250 mL/hr over 60 Minutes Intravenous  Once 12/02/17 1522 12/02/17 1759        Cultures:  12/02/2017, blood cultures x2-MSSA  12/04/2017 blood cultures x2-no growth x5 days  12/05/2017, intraoperative epidural abscess culture-MSSA (azithromycin-R, tetracycline-R)   Telemetry: No  DVT prophylaxis: Heparin   Objective: Vitals:   01/06/18 1531 01/06/18 2112 01/07/18 0509 01/07/18 1439  BP: 103/72 112/78 118/60 115/84  Pulse: 93 95 62 92  Resp: 18 18 17 16   Temp: 97.9 F (36.6 C) 97.6 F (36.4 C) 97.8 F (36.6 C) 97.7 F (36.5 C)  TempSrc: Oral Oral Oral Oral  SpO2: 99% 98% 90% 99%   Weight:      Height:        Intake/Output Summary (Last 24 hours) at 01/07/2018 1711 Last data filed at 01/07/2018 1440 Gross per 24 hour  Intake 1040 ml  Output -  Net 1040 ml   Filed Weights   01/03/18 0500 01/04/18 0812 01/05/18 0537  Weight: 79.7 kg 79.8 kg 79 kg    Exam:  Constitutional:normal appearing female, no distress Eyes: EOMI, anicteric, normal conjunctivae ENMT: Oropharynx with moist mucous membranes, normal dentition Cardiovascular: RRR with SEM loudest at right sternal border, with no peripheral edema Respiratory: Normal respiratory effort, clear breath sounds  Abdomen: Soft,non-tender,  Skin: No rash ulcers, or lesions. Without skin tenting, scar in right arm near inner elbow ( no redness or tenderness) Neurologic: Grossly no focal neuro deficit. Psychiatric:Appropriate affect, and mood. Mental status AAOx3  Data Reviewed: CBC: Recent Labs  Lab 01/01/18 0414 01/06/18 0344  WBC 6.9 5.8  HGB 8.8* 8.8*  HCT 29.2* 29.2*  MCV 91.8 92.1  PLT 484* 119   Basic Metabolic Panel: Recent Labs  Lab 01/01/18 0414 01/01/18 1117 01/06/18 0344  NA 139 136 135  K 4.3 4.8 4.3  CL 100 102 97*  CO2 27 24 31   GLUCOSE 91 100* 96  BUN 18 16 21*  CREATININE 0.55 0.55 0.62  CALCIUM 9.6 9.6 9.2   GFR: Estimated Creatinine Clearance: 108.3 mL/min (by C-G formula based on SCr of 0.62 mg/dL). Liver Function Tests: No results for input(s): AST, ALT, ALKPHOS, BILITOT, PROT, ALBUMIN in the last 168 hours. No results for input(s): LIPASE, AMYLASE in the last 168 hours. No results for input(s): AMMONIA in the last 168 hours. Coagulation Profile: No results for input(s): INR, PROTIME in the last 168 hours. Cardiac Enzymes: No results for input(s): CKTOTAL, CKMB, CKMBINDEX, TROPONINI in the last 168 hours. BNP (last 3 results) No results for input(s): PROBNP in the last 8760 hours. HbA1C: No results for input(s): HGBA1C in the last 72 hours. CBG: No results for  input(s): GLUCAP in the last 168 hours. Lipid Profile: No results for input(s): CHOL, HDL, LDLCALC, TRIG, CHOLHDL, LDLDIRECT in the last 72 hours. Thyroid Function Tests: No results for input(s): TSH, T4TOTAL, FREET4, T3FREE, THYROIDAB in the last 72 hours. Anemia Panel: No results for input(s): VITAMINB12, FOLATE, FERRITIN, TIBC, IRON, RETICCTPCT in the last 72 hours. Urine analysis:    Component Value Date/Time   COLORURINE YELLOW 12/21/2017 [place] 12/21/2017 0942   APPEARANCEUR Hazy 07/10/2013 1121   LABSPEC 1.023 12/21/2017 0942   LABSPEC 1.020 07/10/2013 1121   PHURINE 5.0 12/21/2017 0942   GLUCOSEU NEGATIVE 12/21/2017 0942   GLUCOSEU Negative 07/10/2013 1121   HGBUR SMALL (A) 12/21/2017 0942   BILIRUBINUR NEGATIVE 12/21/2017 0942   BILIRUBINUR Negative  07/10/2013 1121   [place] 12/21/2017 0942   PROTEINUR NEGATIVE 12/21/2017 0942   UROBILINOGEN 1.0 02/15/2015 0530   NITRITE NEGATIVE 12/21/2017 0942   LEUKOCYTESUR MODERATE (A) 12/21/2017 0942   LEUKOCYTESUR Trace 07/10/2013 1121   Sepsis Labs: @LABRCNTIP (procalcitonin:4,lacticidven:4)  )No results found for this or any previous visit (from the past 240 hour(s)).    Studies: No results found.  Scheduled Meds: . acetaminophen  1,000 mg Oral Q8H  . bupivacaine liposome  20 mL Infiltration Once  . bupivacaine-EPINEPHrine  50 mL Infiltration Once  . cyclobenzaprine  10 mg Oral TID  . diclofenac sodium  2 g Topical QID  . docusate sodium  100 mg Oral BID  . fluticasone  2 spray Each Nare Daily  . heparin  5,000 Units Subcutaneous Q8H  . lactobacillus  1 g Oral TID WC  . lidocaine  1 patch Transdermal Q24H  . magnesium oxide  400 mg Oral BID  . methadone  60 mg Oral Daily  . nicotine  21 mg Transdermal Daily  . pantoprazole  40 mg Oral Daily  . polyethylene glycol  17 g Oral BID  . predniSONE  5 mg Oral Q breakfast  . senna-docusate  1 tablet Oral BID  . sodium chloride flush  10-40  mL Intracatheter Q12H  . tamsulosin  0.4 mg Oral Daily    Continuous Infusions: .  ceFAZolin (ANCEF) IV 2 g (01/07/18 1511)  . lactated ringers 10 mL/hr at 01/06/18 0359  . methocarbamol (ROBAXIN) IV Stopped (12/29/17 2335)     LOS: 36 days     [name], MD [facility] Pager [phone]  If 7PM-7AM, please contact night-coverage [email or web address] Password [email or web address] 01/07/2018, 5:11 PM

## 2018-01-08 NOTE — Progress Notes (Addendum)
PROGRESS NOTE    Kelly [name]  CHY:[id number] DOB: [birth year] DOA: 12/02/2017 PCP: Patient, No Pcp Per    Brief Narrative: Kelly [name] is a 32 y.o. 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 - (HIV, Hepatitis C, AB, RPR negative ) -Needs 8 weeks of IV cefazolin--end date 9-18. -needs weekly CMP and CBC.  -per ID patient might need oral antibiotics after she complete IV. Will need to discussed with ID close to date of discharge.  -She will need to follow up with cardiology for repeat ECHO out patient. In a week or so.  -Discussed with ID, Dr Baxter Flattery, patient will need repeat Blood culture near to completing antibiotics. Patient will need to be discharge on Bactrim DS BID for at least a month. Check ESR, CRP tomorrow.   2-Lumbar epidural abscess, S/P laminectomy and drainage on 7-25.  MRI; on 8/14 showed persistent discitis possible epidural soft thickening, neurosurgery at that time recommended no further intervention.  Urine retention resolved.  Some chronic left LE weakness.   3-Severe tricuspid regurgitation;  Cardiology recommends  completing IV antibiotics repeating echo as outpatient to determine further need for intervention.  4-Adrenal insufficiency;  a.m. cortisol 3.4.  ACTH within normal range inappropriately.  Concern for secondary adrenal insufficiency.  Previous physician discussed with endocrinology on 8/19 who recommended steroids follow-up as outpatient.  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 prednisone 5 mg daily  5-Retained needle fragment.  X-ray confirms 8 mm needle fragment anterior to proximal humerus (prior surgery 05/2016, for retained needle with associated infection).   No signs or symptoms of acute infection at site.  Spoke with [name] 8/23) who agrees given asymptomatic, location(not in joint) no need for surgical intervention  Polysubstance abuse;  Patient will follow put patient rehab.   Urinary retention and stool incontinence, resolved. Briefly required Foley for urinary retention which is now resolved.  Discdontinue Flomax andbladder scans.  Constipation; bowel regimen.  Anemia; hb stable.   Anemia; hb stable. Will check anemia panel.    DVT prophylaxis:  Code Status: full code.  Family Communication: care discussed with patient.  Disposition Plan: remain in the hospital for IV antibiotics.    Consultants:   ID   Cardiology.      Procedures:   12/05/17: L3, L4, L5 and S1 laminectomy for drainage of lumbar epidural abscess   TTE: 12/03/17 Study Conclusions; Vegetation noted on the septal leaflet of the tricuspid valve consistent with endocarditis. Cannot exlude vegetation on the anterior leaflet of the mitral valve. Suggest TEE to better assess.   Antimicrobials: Cefazolin 7-26  Subjective Denies abdominal pain.  Denies chest pain.  Objective: Vitals:   01/07/18 0509 01/07/18 1439 01/07/18 2038 01/08/18 0504  BP: 118/60 115/84 105/67 109/77  Pulse: 62 92 83 70  Resp: 17 16 16 16   Temp: 97.8 F (36.6 C) 97.7 F (36.5 C) 98 F (36.7 C)  98.3 F (36.8 C)  TempSrc: Oral Oral Oral Oral  SpO2: 90% 99% 98% 99%  Weight:      Height:        Intake/Output Summary (Last 24 hours) at 01/08/2018 1323 Last data filed at 01/07/2018 1904 Gross per 24 hour  Intake 968.3 ml  Output -  Net 968.3 ml   Filed Weights   01/03/18 0500 01/04/18 0812 01/05/18 0537  Weight: 79.7 kg 79.8 kg 79 kg    Examination:  General exam: Appears calm and comfortable  Respiratory system: Clear to auscultation. Respiratory effort normal. Cardiovascular system: S1 & S2 heard, RRR. No JVD, murmurs, rubs, gallops or clicks. No pedal edema. Gastrointestinal system: Abdomen is nondistended, soft and nontender. No organomegaly or masses felt. Normal bowel sounds heard. Central nervous system: Alert and oriented. No focal neurological deficits. Extremities: Symmetric 5 x 5 power. Skin: No rashes, lesions or ulcers   Data Reviewed: I have personally reviewed following labs and imaging studies  CBC: Recent Labs  Lab 01/06/18 0344  WBC 5.8  HGB 8.8*  HCT 29.2*  MCV 92.1  PLT 024   Basic Metabolic Panel: Recent Labs  Lab 01/06/18 0344  NA 135  K 4.3  CL 97*  CO2 31  GLUCOSE 96  BUN 21*  CREATININE 0.62  CALCIUM 9.2   GFR: Estimated Creatinine Clearance: 108.3 mL/min (by C-G formula based on SCr of 0.62 mg/dL). Liver Function Tests: No results for input(s): AST, ALT, ALKPHOS, BILITOT, PROT, ALBUMIN in the last 168 hours. No results for input(s): LIPASE, AMYLASE in the last 168 hours. No results for input(s): AMMONIA in the last 168 hours. Coagulation Profile: No results for input(s): INR, PROTIME in the last 168 hours. Cardiac Enzymes: No results for input(s): CKTOTAL, CKMB, CKMBINDEX, TROPONINI in the last 168 hours. BNP (last 3 results) No results for input(s): PROBNP in the last 8760 hours. HbA1C: No results for input(s): HGBA1C in the last 72 hours. CBG: No results for input(s): GLUCAP in the last 168 hours. Lipid  Profile: No results for input(s): CHOL, HDL, LDLCALC, TRIG, CHOLHDL, LDLDIRECT in the last 72 hours. Thyroid Function Tests: No results for input(s): TSH, T4TOTAL, FREET4, T3FREE, THYROIDAB in the last 72 hours. Anemia Panel: No results for input(s): VITAMINB12, FOLATE, FERRITIN, TIBC, IRON, RETICCTPCT in the last 72 hours. Sepsis Labs: No results for input(s): PROCALCITON, LATICACIDVEN in the last 168 hours.  No results found for this or any previous visit (from the past 240 hour(s)).       Radiology Studies: No results found.      Scheduled Meds: . acetaminophen  1,000 mg Oral Q8H  . bupivacaine liposome  20 mL Infiltration Once  . bupivacaine-EPINEPHrine  50 mL Infiltration Once  . cyclobenzaprine  10 mg Oral TID  . diclofenac sodium  2 g Topical QID  . docusate sodium  100 mg Oral BID  . fluticasone  2 spray Each Nare Daily  . heparin  5,000 Units Subcutaneous Q8H  . lactobacillus  1 g Oral TID WC  . lidocaine  1 patch Transdermal Q24H  . magnesium oxide  400 mg Oral BID  . methadone  60 mg Oral Daily  . nicotine  21 mg Transdermal Daily  .  pantoprazole  40 mg Oral Daily  . polyethylene glycol  17 g Oral BID  . predniSONE  5 mg Oral Q breakfast  . senna-docusate  1 tablet Oral BID  . sodium chloride flush  10-40 mL Intracatheter Q12H  . tamsulosin  0.4 mg Oral Daily   Continuous Infusions: .  ceFAZolin (ANCEF) IV 2 g (01/08/18 0506)  . lactated ringers 10 mL/hr at 01/06/18 0359  . methocarbamol (ROBAXIN) IV 500 mg (01/07/18 1944)     LOS: 37 days    Time spent: 35 minutes.     Elmarie Shiley, MD [facility] Pager [phone]  If 7PM-7AM, please contact night-coverage [email or web address] Password [email or web address] 01/08/2018, 1:23 PM

## 2018-01-08 NOTE — Progress Notes (Signed)
Rehab Admissions Coordinator Note:  Per PT communication, pt has requested to be re-screened by Jhonnie Garner for appropriateness for an Inpatient Acute Rehab Consult. Pt is ambulating 300 feet with [employer] and is [employer] for transfers. With her current functional level, pt does not require an intensive inpatient rehab stay. It it noted that pt will require an in-house stay until 01/29/18 to complete her course of IV antibiotics and anticipate that given that extended stay, she may progress well with acute therapies to return home safely with stair negotiation.   Jhonnie Garner 01/08/2018, 10:05 AM  I can be reached at [phone].

## 2018-01-08 NOTE — Progress Notes (Signed)
Occupational Therapy Treatment Patient Details Name: Kelly [name] MRN: [id number] DOB: 12/17/1985 Today's Date: 01/08/2018    History of present illness Kelly [name] is a 32 y.o. F with sepsis, tachycardia, tachypnea, leukocytosis, hypotension.  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 PMH of polysubstance abuse, ADD, scoliosis sp back surgery (20 years ago per mom). PICC placed 12/08/17.   OT comments  Pt demonstrating progress toward OT goals this session. She has difficulty donning shoes due to decreased functional ROM and coordination in L LE. Educated pt concerning use of long handled shoe horn and she was able to progress to supervision level with this. Educated pt concerning need for frequent skin checks of L foot and L LE in general due to decreased sensation. Additionally provided reacher to assist pt in retrieving items while adhering to back precautions. Will continue to follow while admitted.    Follow Up Recommendations  CIR;Supervision/Assistance - 24 hour    Equipment Recommendations  3 in 1 bedside commode;Wheelchair (measurements OT);Wheelchair cushion (measurements OT)    Recommendations for Other Services Rehab consult    Precautions / Restrictions Precautions Precautions: Back;Fall Precaution Booklet Issued: No Precaution Comments: Reviewed back precautions related to bed mobility.  Restrictions Weight Bearing Restrictions: No       Mobility Bed Mobility Overal bed mobility: Needs Assistance Bed Mobility: Rolling;Sidelying to Sit;Sit to Sidelying Rolling: Supervision Sidelying to sit: Supervision     Sit to sidelying: Supervision General bed mobility comments: Supervision for safety throughout session.   Transfers                      Balance Overall balance assessment: Needs assistance Sitting-balance support: No upper extremity supported;Feet  supported Sitting balance-Leahy Scale: Good                                     ADL either performed or assessed with clinical judgement   ADL Overall ADL's : Needs assistance/impaired                     Lower Body Dressing: Sit to/from stand;Supervision/safety Lower Body Dressing Details (indicate cue type and reason): Min assist initially to utilize shoe horn to don shoes. However, able to progress to supervision level.                General ADL Comments: Session focused on education concerning use of shoe horn to don shoes at EOB as pt has difficulty especially with L LE due to numbness. Pt able to progress to supervision level. Also education concerning use of reacher. Shoe horn and reacher provided.      Vision       Perception     Praxis      Cognition Arousal/Alertness: Awake/alert Behavior During Therapy: WFL for tasks assessed/performed Overall Cognitive Status: Within Functional Limits for tasks assessed                                          Exercises     Shoulder Instructions       General Comments      Pertinent Vitals/ Pain       Pain Assessment: Faces Faces Pain Scale: Hurts a little bit Pain Location:  Lower back Pain Descriptors / Indicators: Grimacing;Guarding Pain Intervention(s): Limited activity within patient's tolerance;Monitored during session;Repositioned  Home Living                                          Prior Functioning/Environment              Frequency  Min 2X/week        Progress Toward Goals  OT Goals(current goals can now be found in the care plan section)  Progress towards OT goals: Progressing toward goals  Acute Rehab OT Goals Patient Stated Goal: go through intensive therapy before going home.  OT Goal Formulation: With patient Time For Goal Achievement: 01/21/18 Potential to Achieve Goals: Good  Plan Frequency remains appropriate;Discharge  plan remains appropriate    Co-evaluation                 AM-PAC PT "6 Clicks" Daily Activity     Outcome Measure   Help from another person eating meals?: None Help from another person taking care of personal grooming?: A Little Help from another person toileting, which includes using toliet, bedpan, or urinal?: A Little Help from another person bathing (including washing, rinsing, drying)?: A Little Help from another person to put on and taking off regular upper body clothing?: A Little Help from another person to put on and taking off regular lower body clothing?: A Little 6 Click Score: 19    End of Session    OT Visit Diagnosis: Unsteadiness on feet (R26.81);Other abnormalities of gait and mobility (R26.89);Pain Pain - Right/Left: Left Pain - part of body: (back)   Activity Tolerance Patient tolerated treatment well   Patient Left in bed;with call bell/phone within reach   Nurse Communication Mobility status        Time: 6153-7943 OT Time Calculation (min): 22 min  Charges: OT General Charges $OT Visit: 1 Visit OT Treatments $Self Care/Home Management : 8-22 mins  Norman Herrlich, MS OTR/L  Pager: Bradley A Elmond Poehlman 01/08/2018, 4:58 PM

## 2018-01-09 LAB — VITAMIN B12: Vitamin B-12: 415 pg/mL (ref 180–914)

## 2018-01-09 LAB — RETICULOCYTES
RBC.: 3.23 MIL/uL — ABNORMAL LOW (ref 3.87–5.11)
Retic Count, Absolute: 106.6 10*3/uL (ref 19.0–186.0)
Retic Ct Pct: 3.3 % — ABNORMAL HIGH (ref 0.4–3.1)

## 2018-01-09 LAB — IRON AND TIBC
Iron: 44 ug/dL (ref 28–170)
Saturation Ratios: 11 % (ref 10.4–31.8)
TIBC: 407 ug/dL (ref 250–450)
UIBC: 363 ug/dL

## 2018-01-09 LAB — C-REACTIVE PROTEIN

## 2018-01-09 LAB — FOLATE: Folate: 5.5 ng/mL — ABNORMAL LOW (ref >5.9)

## 2018-01-09 LAB — SEDIMENTATION RATE: Sed Rate: 65 mm/hr — ABNORMAL HIGH (ref 0–22)

## 2018-01-09 LAB — FERRITIN: Ferritin: 36 ng/mL (ref 11–307)

## 2018-01-09 MED ORDER — FOLIC ACID 1 MG PO TABS
1.0000 mg | ORAL_TABLET | Freq: Every day | ORAL | Status: DC
  Administered 2018-01-09 – 2018-01-23 (×15): 1 mg via ORAL
  Filled 2018-01-09 (×15): qty 1

## 2018-01-09 MED ORDER — FERROUS SULFATE 325 (65 FE) MG PO TABS
325.0000 mg | ORAL_TABLET | Freq: Every day | ORAL | Status: DC
  Administered 2018-01-10 – 2018-01-30 (×12): 325 mg via ORAL
  Filled 2018-01-09 (×22): qty 1

## 2018-01-09 NOTE — Progress Notes (Signed)
Nutrition Brief Note  RD pulled to chart due to LOS.  Wt Readings from Last 15 Encounters:  01/09/18 81.7 kg  11/21/17 72.6 kg  11/17/17 72.6 kg  08/29/16 72.6 kg  07/26/16 77.1 kg  05/24/16 81.6 kg  02/15/15 72.6 kg  07/13/14 78.8 kg  08/24/13 86.2 kg  01/27/13 81.6 kg  01/26/13 81.6 kg  06/24/12 81.6 kg  01/31/12 77.1 kg  03/27/11 81.6 kg   Kelly [name] is a 32 y.o. 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  Pt with MSSA bacteremia with tricuspid valve endocarditis. Per MD notes, plan to stay in hospital until 8 weeks of IV cefazolin until 01/29/18.  Body mass index is 28.21 kg/m. Patient meets criteria for overweight based on current BMI.   Current diet order is regular, patient is consuming approximately 75-100% of meals at this time. Labs and medications reviewed.   No nutrition interventions warranted at this time. If nutrition issues arise, please consult RD.   Kelly [name], RD, LDN, CDE Pager: [phone] After hours Pager: [phone]

## 2018-01-09 NOTE — Progress Notes (Signed)
PROGRESS NOTE    Kelly [name]  IRJ:[id number] DOB: 08-20-1985 DOA: 12/02/2017 PCP: Patient, No Pcp Per    Brief Narrative: Kelly [name] is a 32 y.o. 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 - (HIV, Hepatitis C, AB, RPR negative ) -Needs 8 weeks of IV cefazolin--end date 9-18. -needs weekly CMP and CBC.  -per ID patient might need oral antibiotics after she complete IV. Will need to discussed with ID close to date of discharge.  -She will need to follow up with cardiology for repeat ECHO out patient. In a week or so.  -Discussed with ID, Dr Baxter Flattery, patient will need repeat Blood culture near to completing antibiotics. Patient will need to be discharge on Bactrim DS BID for at least a month. C ESR down to 65 CRP 0.8 -blood culture 8-28 follow up.   2-Lumbar epidural abscess, S/P laminectomy and drainage on 7-25.  MRI; on 8/14 showed persistent discitis possible epidural soft thickening, neurosurgery at that time recommended no further intervention.  Urine retention resolved.  Some chronic left LE weakness.   3-Severe tricuspid  regurgitation;  Cardiology recommends completing IV antibiotics repeating echo as outpatient to determine further need for intervention.  4-Adrenal insufficiency;  a.m. cortisol 3.4.  ACTH within normal range inappropriately.  Concern for secondary adrenal insufficiency.  Previous physician discussed with endocrinology on 8/19 who recommended steroids follow-up as outpatient.  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 prednisone 5 mg daily  5-Retained needle fragment.  X-ray confirms 8 mm needle fragment anterior to proximal humerus (prior surgery 05/2016, for retained needle with associated infection).   No signs or symptoms of acute infection at site.  Spoke with [name] 8/23) who agrees given asymptomatic, location(not in joint) no need for surgical intervention  Polysubstance abuse;  Patient will follow put patient rehab.   Urinary retention and stool incontinence, resolved. Briefly required Foley for urinary retention which is now resolved.  Discdontinue Flomax andbladder scans.  Constipation; bowel regimen.  Anemia; hb stable.   Anemia; hb stable. Will check anemia panel.    DVT prophylaxis:  Code Status: full code.  Family Communication: care discussed with patient.  Disposition Plan: remain in the hospital for IV antibiotics.    Consultants:   ID   Cardiology.      Procedures:   12/05/17: L3, L4, L5 and S1 laminectomy for drainage of lumbar epidural abscess   TTE: 12/03/17 Study Conclusions; Vegetation noted on the septal leaflet of the tricuspid valve consistent with endocarditis. Cannot exlude vegetation on the anterior leaflet of the mitral valve. Suggest TEE to better assess.   Antimicrobials: Cefazolin 7-26  Subjective She  report back pain today, she walk a lot yesterday   Objective: Vitals:   01/08/18 2026 01/08/18 2100 01/09/18 0422 01/09/18 0434  BP: 113/76  103/68   Pulse: (!) 102  75   Resp: 16  16   Temp: 98.5 F (36.9 C)   98.4 F (36.9 C)   TempSrc: Oral  Oral   SpO2: 99%  98%   Weight:  81.7 kg  81.7 kg  Height:        Intake/Output Summary (Last 24 hours) at 01/09/2018 1424 Last data filed at 01/09/2018 0957 Gross per 24 hour  Intake 1324.94 ml  Output 3 ml  Net 1321.94 ml   Filed Weights   01/05/18 0537 01/08/18 2100 01/09/18 0434  Weight: 79 kg 81.7 kg 81.7 kg    Examination:  General exam: NAD Respiratory system:CTA Cardiovascular system: S 1, S 2 RRR Gastrointestinal system: BS present,soft, nt Central nervous system: alert.  Extremities: no edema Skin: no rashes.    Data Reviewed: I have personally reviewed following labs and imaging studies  CBC: Recent Labs  Lab 01/06/18 0344  WBC 5.8  HGB 8.8*  HCT 29.2*  MCV 92.1  PLT 761   Basic Metabolic Panel: Recent Labs  Lab 01/06/18 0344  NA 135  K 4.3  CL 97*  CO2 31  GLUCOSE 96  BUN 21*  CREATININE 0.62  CALCIUM 9.2   GFR: Estimated Creatinine Clearance: 109.9 mL/min (by C-G formula based on SCr of 0.62 mg/dL). Liver Function Tests: No results for input(s): AST, ALT, ALKPHOS, BILITOT, PROT, ALBUMIN in the last 168 hours. No results for input(s): LIPASE, AMYLASE in the last 168 hours. No results for input(s): AMMONIA in the last 168 hours. Coagulation Profile: No results for input(s): INR, PROTIME in the last 168 hours. Cardiac Enzymes: No results for input(s): CKTOTAL, CKMB, CKMBINDEX, TROPONINI in the last 168 hours. BNP (last 3 results) No results for input(s): PROBNP in the last 8760 hours. HbA1C: No results for input(s): HGBA1C in the last 72 hours. CBG: No results for input(s): GLUCAP in the last 168 hours. Lipid Profile: No results for input(s): CHOL, HDL, LDLCALC, TRIG, CHOLHDL, LDLDIRECT in the last 72 hours. Thyroid Function Tests: No results for input(s): TSH, T4TOTAL, FREET4, T3FREE, THYROIDAB in the last 72 hours. Anemia Panel: Recent Labs    01/09/18 0428  VITAMINB12 415  FOLATE 5.5*    FERRITIN 36  TIBC 407  IRON 44  RETICCTPCT 3.3*   Sepsis Labs: No results for input(s): PROCALCITON, LATICACIDVEN in the last 168 hours.  No results found for this or any previous visit (from the past 240 hour(s)).       Radiology Studies: No results found.      Scheduled Meds: . acetaminophen  1,000 mg Oral Q8H  . bupivacaine liposome  20 mL Infiltration Once  . bupivacaine-EPINEPHrine  50 mL Infiltration Once  . cyclobenzaprine  10 mg Oral TID  . diclofenac sodium  2 g Topical QID  . docusate sodium  100 mg Oral BID  . ferrous sulfate  325 mg Oral Q breakfast  . fluticasone  2 spray Each Nare Daily  . folic acid  1 mg Oral Daily  . heparin  5,000 Units Subcutaneous Q8H  . lactobacillus  1 g Oral TID WC  . lidocaine  1 patch Transdermal Q24H  . magnesium oxide  400 mg Oral BID  . methadone  60 mg Oral Daily  . nicotine  21 mg Transdermal Daily  .  pantoprazole  40 mg Oral Daily  . polyethylene glycol  17 g Oral BID  . predniSONE  5 mg Oral Q breakfast  . senna-docusate  1 tablet Oral BID  . sodium chloride flush  10-40 mL Intracatheter Q12H  . tamsulosin  0.4 mg Oral Daily   Continuous Infusions: .  ceFAZolin (ANCEF) IV 2 g (01/09/18 1344)  . lactated ringers 10 mL/hr at 01/09/18 0604  . methocarbamol (ROBAXIN) IV 100 mL/hr at 01/07/18 1947     LOS: 38 days    Time spent: 35 minutes.     Elmarie Shiley, MD [facility] Pager [phone]  If 7PM-7AM, please contact night-coverage [email or web address] Password [email or web address] 01/09/2018, 2:24 PM

## 2018-01-10 ENCOUNTER — Inpatient Hospital Stay (HOSPITAL_COMMUNITY): Payer: Medicaid Other

## 2018-01-10 LAB — BASIC METABOLIC PANEL
ANION GAP: 11 (ref 5–15)
BUN: 19 mg/dL (ref 6–20)
CALCIUM: 9.5 mg/dL (ref 8.9–10.3)
CHLORIDE: 99 mmol/L (ref 98–111)
CO2: 26 mmol/L (ref 22–32)
CREATININE: 0.63 mg/dL (ref 0.44–1.00)
GFR calc Af Amer: >60 mL/min (ref >60)
GFR calc non Af Amer: >60 mL/min (ref >60)
Glucose, Bld: 110 mg/dL — ABNORMAL HIGH (ref 70–99)
Potassium: 4.8 mmol/L (ref 3.5–5.1)
SODIUM: 136 mmol/L (ref 135–145)

## 2018-01-10 MED ORDER — HYDROMORPHONE HCL 1 MG/ML IJ SOLN
1.0000 mg | Freq: Once | INTRAMUSCULAR | Status: AC
  Administered 2018-01-10: 1 mg via INTRAVENOUS
  Filled 2018-01-10: qty 1

## 2018-01-10 MED ORDER — GADOBENATE DIMEGLUMINE 529 MG/ML IV SOLN
15.0000 mL | Freq: Once | INTRAVENOUS | Status: AC
  Administered 2018-01-10: 15 mL via INTRAVENOUS

## 2018-01-10 NOTE — Progress Notes (Signed)
Physical Therapy Treatment Patient Details Name: Kelly [name] MRN: [id number] DOB: 06-May-1986 Today's Date: 01/10/2018    History of Present Illness Kelly [name] is a 32 y.o. F with sepsis, tachycardia, tachypnea, leukocytosis, hypotension.  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 PMH of polysubstance abuse, ADD, scoliosis sp back surgery (20 years ago per mom). PICC placed 12/08/17.    PT Comments    Pt making steady progress with functional mobility.  She continues to rely heavily on RW for support with standing and ambulation. Pt participated in therex for bilateral LE strengthening as well. Updated recommendations to reflect pt's progress and being denied for CIR. Feel pt would most benefit from OP PT at this time. PT will continue to follow acutely to progress mobility as tolerated.   Follow Up Recommendations  Outpatient PT;Supervision for mobility/OOB     Equipment Recommendations  Rolling walker with 5" wheels;3in1 (PT)    Recommendations for Other Services       Precautions / Restrictions Precautions Precautions: Back;Fall Precaution Booklet Issued: No Restrictions Weight Bearing Restrictions: No    Mobility  Bed Mobility Overal bed mobility: Needs Assistance Bed Mobility: Rolling;Sidelying to Sit Rolling: Supervision Sidelying to sit: Supervision       General bed mobility comments: supervision for safety  Transfers Overall transfer level: Needs assistance Equipment used: Rolling walker (2 wheeled) Transfers: Sit to/from Stand Sit to Stand: Supervision         General transfer comment: supervision for safety, good technique demonstrated  Ambulation/Gait Ambulation/Gait assistance: Min guard Gait Distance (Feet): 500 Feet Assistive device: Rolling walker (2 wheeled) Gait Pattern/deviations: Step-through pattern;Trendelenburg(trendelenburg on R LE) Gait  velocity: Decreased Gait velocity interpretation: 1.31 - 2.62 ft/sec, indicative of limited community ambulator General Gait Details: pt maintaining knees in flexion throughout; cueing for relaxing shoulders and improved posture; pt with mild instability but no overt LOB or need for physical assistance, close min guard for safety   Stairs             Wheelchair Mobility    Modified Rankin (Stroke Patients Only)       Balance Overall balance assessment: Needs assistance Sitting-balance support: No upper extremity supported;Feet supported Sitting balance-Leahy Scale: Good Sitting balance - Comments: able to sit EOB independently w/o support   Standing balance support: Bilateral upper extremity supported;Single extremity supported Standing balance-Leahy Scale: Poor                              Cognition Arousal/Alertness: Awake/alert Behavior During Therapy: WFL for tasks assessed/performed Overall Cognitive Status: Within Functional Limits for tasks assessed                                        Exercises General Exercises - Lower Extremity Hip ABduction/ADduction: AROM;Strengthening;Both;10 reps;Standing Mini-Sqauts: AROM;Strengthening;Both;10 reps Other Exercises Other Exercises: lateral walking 10' bilaterally x2    General Comments        Pertinent Vitals/Pain Pain Assessment: Faces Faces Pain Scale: Hurts even more Pain Location: Lower back Pain Descriptors / Indicators: Grimacing;Guarding Pain Intervention(s): Monitored during session;Repositioned    Home Living                      Prior Function  PT Goals (current goals can now be found in the care plan section) Acute Rehab PT Goals PT Goal Formulation: With patient Time For Goal Achievement: 01/17/18 Potential to Achieve Goals: Good Progress towards PT goals: Progressing toward goals    Frequency    Min 3X/week      PT Plan Current plan  remains appropriate;Discharge plan needs to be updated    Co-evaluation              AM-PAC PT "6 Clicks" Daily Activity  Outcome Measure  Difficulty turning over in bed (including adjusting bedclothes, sheets and blankets)?: A Little Difficulty moving from lying on back to sitting on the side of the bed? : A Little Difficulty sitting down on and standing up from a chair with arms (e.g., wheelchair, bedside commode, etc,.)?: Unable Help needed moving to and from a bed to chair (including a wheelchair)?: A Little Help needed walking in hospital room?: A Little Help needed climbing 3-5 steps with a railing? : A Little 6 Click Score: 16    End of Session   Activity Tolerance: Patient tolerated treatment well Patient left: in bed;with call bell/phone within reach;Other (comment)(sitting EOB) Nurse Communication: Mobility status PT Visit Diagnosis: Other abnormalities of gait and mobility (R26.89);Pain Pain - Right/Left: Left Pain - part of body: Hip     Time: 2111-5520 PT Time Calculation (min) (ACUTE ONLY): 30 min  Charges:  $Gait Training: 8-22 mins $Therapeutic Exercise: 8-22 mins                     Sherie Don, PT, DPT  Acute Rehabilitation Services Pager [phone] Office [place] 01/10/2018, 3:53 PM

## 2018-01-10 NOTE — Progress Notes (Signed)
PROGRESS NOTE    Kelly [name]  EHM:[id number] DOB: February 17, 1986 DOA: 12/02/2017 PCP: Patient, No Pcp Per    Brief Narrative: Kelly [name] is a 32 y.o. 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 - (HIV, Hepatitis C, AB, RPR negative ) -Needs 8 weeks of IV cefazolin--end date 9-18. -needs weekly CMP and CBC.  -per ID patient might need oral antibiotics after she complete IV. Will need to discussed with ID close to date of discharge.  -She will need to follow up with cardiology for repeat ECHO out patient. In a week or so.  -Discussed with ID, Dr Baxter Flattery, patient will need repeat Blood culture near to completing antibiotics. Patient will need to be discharge on Bactrim DS BID for at least a month. C ESR down to 65 CRP 0.8 -blood culture 8-28 no growth   2-Lumbar epidural abscess, S/P laminectomy and drainage on 7-25.  MRI; on 8/14 showed persistent discitis possible epidural soft thickening, neurosurgery at that time recommended no further intervention.  Urine retention resolved.  Some chronic left LE weakness.  Report numbness LE callf, some  weakness. Discussed with Dr Arnoldo Morale plan to get MRI lumbar spine.   3-Severe tricuspid regurgitation;  Cardiology recommends completing IV antibiotics repeating echo as outpatient to determine further need for intervention.  4-Adrenal insufficiency;  a.m. cortisol 3.4.  ACTH within normal range inappropriately.  Concern for secondary adrenal insufficiency.  Previous physician discussed with endocrinology on 8/19 who recommended steroids follow-up as outpatient.  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 prednisone 5 mg daily  5-Retained needle fragment.  X-ray confirms 8 mm needle fragment anterior to proximal humerus (prior surgery 05/2016, for retained needle with associated infection).   No signs or symptoms of acute infection at site.  Spoke with [name] 8/23) who agrees given asymptomatic, location(not in joint) no need for surgical intervention  Polysubstance abuse;  Patient will follow put patient rehab.   Urinary retention and stool incontinence, resolved. Briefly required Foley for urinary retention which is now resolved.  Discdontinue Flomax andbladder scans.  Constipation; bowel regimen.  Anemia; hb stable.   Anemia; hb stable. Will check anemia panel.    DVT prophylaxis:  Code Status: full code.  Family Communication: care discussed with patient.  Disposition Plan: remain in the hospital for IV antibiotics.    Consultants:   ID   Cardiology.      Procedures:   12/05/17: L3, L4, L5 and S1 laminectomy for drainage of lumbar epidural abscess   TTE: 12/03/17 Study Conclusions; Vegetation noted on the septal leaflet of the tricuspid valve consistent with endocarditis. Cannot exlude vegetation on the anterior leaflet  of the mitral valve. Suggest TEE to better assess.   Antimicrobials: Cefazolin 7-26  Subjective Complaints of left LE numbness calf. Some mild weakness  Objective: Vitals:   01/09/18 0434 01/09/18 1447 01/09/18 2230 01/10/18  0457  BP:  109/72 113/71 105/69  Pulse:  77 90 76  Resp:  _0 Temp:  97.7 F (36.5 C) 98.5 F (36.9 C) 98.5 F (36.9 C)  TempSrc:  Oral Oral Oral  SpO2:  98% 98% 99%  Weight: 81.7 kg     Height:        Intake/Output Summary (Last 24 hours) at 01/10/2018 1217 Last data filed at 01/10/2018 0900 Gross per 24 hour  Intake 780 ml  Output --  Net 780 ml   Filed Weights   01/05/18 0537 01/08/18 2100 01/09/18 0434  Weight: 79 kg 81.7 kg 81.7 kg    Examination:  General exam: NAD Respiratory system:CTA Cardiovascular system: S 1, S 2 RRR Gastrointestinal system: BS present, soft, nt Central nervous system: Alert Extremities: No edema Skin: no rashes.    Data Reviewed: I have personally reviewed following labs and imaging studies  CBC: Recent Labs  Lab 01/06/18 0344  WBC 5.8  HGB 8.8*  HCT 29.2*  MCV 92.1  PLT 540   Basic Metabolic Panel: Recent Labs  Lab 01/06/18 0344  NA 135  K 4.3  CL 97*  CO2 31  GLUCOSE 96  BUN 21*  CREATININE 0.62  CALCIUM 9.2   GFR: Estimated Creatinine Clearance: 109.9 mL/min (by C-G formula based on SCr of 0.62 mg/dL). Liver Function Tests: No results for input(s): AST, ALT, ALKPHOS, BILITOT, PROT, ALBUMIN in the last 168 hours. No results for input(s): LIPASE, AMYLASE in the last 168 hours. No results for input(s): AMMONIA in the last 168 hours. Coagulation Profile: No results for input(s): INR, PROTIME in the last 168 hours. Cardiac Enzymes: No results for input(s): CKTOTAL, CKMB, CKMBINDEX, TROPONINI in the last 168 hours. BNP (last 3 results) No results for input(s): PROBNP in the last 8760 hours. HbA1C: No results for input(s): HGBA1C in the last 72 hours. CBG: No results for input(s): GLUCAP in the last 168 hours. Lipid Profile: No results for input(s): CHOL, HDL, LDLCALC, TRIG, CHOLHDL, LDLDIRECT in the last 72 hours. Thyroid Function Tests: No results for input(s): TSH, T4TOTAL, FREET4, T3FREE, THYROIDAB in  the last 72 hours. Anemia Panel: Recent Labs    01/09/18 0428  VITAMINB12 415  FOLATE 5.5*  FERRITIN 36  TIBC 407  IRON 44  RETICCTPCT 3.3*   Sepsis Labs: No results for input(s): PROCALCITON, LATICACIDVEN in the last 168 hours.  No results found for this or any previous visit (from the past 240 hour(s)).       Radiology Studies: No results found.      Scheduled Meds: . acetaminophen  1,000 mg Oral Q8H  . bupivacaine liposome  20 mL Infiltration Once  . bupivacaine-EPINEPHrine  50 mL Infiltration Once  . cyclobenzaprine  10 mg Oral TID  . diclofenac sodium  2 g Topical QID  . docusate sodium  100 mg Oral BID  . ferrous sulfate  325 mg Oral Q breakfast  . fluticasone  2 spray Each Nare Daily  . folic acid  1 mg Oral Daily  . heparin  5,000 Units Subcutaneous Q8H  . lactobacillus  1 g Oral TID WC  . lidocaine  1 patch Transdermal Q24H  . magnesium oxide  400 mg Oral BID  . methadone  60 mg Oral Daily  . nicotine  21 mg Transdermal Daily  . pantoprazole  40 mg Oral Daily  . polyethylene glycol  17 g Oral BID  . predniSONE  5 mg Oral Q breakfast  . senna-docusate  1 tablet Oral BID  . sodium chloride flush  10-40 mL Intracatheter Q12H  . tamsulosin  0.4 mg Oral Daily   Continuous Infusions: .  ceFAZolin (ANCEF) IV 2 g (01/10/18 0559)  . lactated ringers 10 mL/hr at 01/09/18 0604  . methocarbamol (ROBAXIN) IV 100 mL/hr at 01/07/18 1947     LOS: 39 days    Time spent: 35 minutes.     Elmarie Shiley, MD [facility] Pager [phone]  If 7PM-7AM, please contact night-coverage [email or web address] Password [facility] 01/10/2018, 12:17 PM

## 2018-01-11 LAB — CBC
HEMATOCRIT: 30.7 % — AB (ref 36.0–46.0)
Hemoglobin: 9.3 g/dL — ABNORMAL LOW (ref 12.0–15.0)
MCH: 28 pg (ref 26.0–34.0)
MCHC: 30.3 g/dL (ref 30.0–36.0)
MCV: 92.5 fL (ref 78.0–100.0)
PLATELETS: 363 10*3/uL (ref 150–400)
RBC: 3.32 MIL/uL — AB (ref 3.87–5.11)
RDW: 15.8 % — ABNORMAL HIGH (ref 11.5–15.5)
WBC: 6.1 10*3/uL (ref 4.0–10.5)

## 2018-01-11 LAB — BASIC METABOLIC PANEL
ANION GAP: 7 (ref 5–15)
BUN: 19 mg/dL (ref 6–20)
CALCIUM: 9.5 mg/dL (ref 8.9–10.3)
CHLORIDE: 99 mmol/L (ref 98–111)
CO2: 31 mmol/L (ref 22–32)
Creatinine, Ser: 0.63 mg/dL (ref 0.44–1.00)
GFR calc Af Amer: >60 mL/min (ref >60)
GLUCOSE: 103 mg/dL — AB (ref 70–99)
POTASSIUM: 4 mmol/L (ref 3.5–5.1)
Sodium: 137 mmol/L (ref 135–145)

## 2018-01-11 NOTE — Progress Notes (Signed)
PROGRESS NOTE    JORY WELKE  YQI:[id number] DOB: 1985/07/10 DOA: 12/02/2017 PCP: Patient, No Pcp Per    Brief Narrative: Kelly [name] is a 32 y.o. 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 - (HIV, Hepatitis C, AB, RPR negative ) -Needs 8 weeks of IV cefazolin--end date 9-18. -needs weekly CMP and CBC.  -per ID patient might need oral antibiotics after she complete IV. Will need to discussed with ID close to date of discharge.  -She will need to follow up with cardiology for repeat ECHO out patient. In a week or so.  -Discussed with ID, Dr Baxter Flattery, patient will need repeat Blood culture near to completing antibiotics. Patient will need to be discharge on Bactrim DS BID for at least a month. C ESR down to 65 CRP 0.8 -blood culture 8-28 no growth   2-Lumbar epidural abscess, S/P laminectomy and drainage on 7-25.  -MRI; on 8/14 showed persistent discitis possible epidural soft thickening, neurosurgery at that time recommended no further intervention.  -Urine retention resolved.  -Some chronic left LE weakness.  -Report numbness LE callf,  some weakness. Discussed with Dr Arnoldo Morale plan to get MRI lumbar spine.  -MRI 8-30; Status post L3-4 through L5-S1 posterior decompression without residual or recurrent epidural abscess. Small residual L5-S1 disc protrusion versus abscess improved from prior examination with decrease, mild potential L5-S1 discitis osteomyelitis. Persistent though improved abnormal enhancement of the cauda equina and intrathecal enhancement, possibly infectious. No canal stenosis. Persistent neural foraminal narrowing L4-5 and L5-S1: Moderate to severe on the LEFT at L5-S1. Will ask neurosurgery to follow on patient.   3-Severe tricuspid regurgitation;  Cardiology recommends completing IV antibiotics repeating echo as outpatient to determine further need for intervention.  4-Adrenal insufficiency;  a.m. cortisol 3.4.  ACTH within normal range inappropriately.  Concern for secondary adrenal insufficiency.  Previous physician discussed with endocrinology on 8/19 who recommended steroids follow-up as outpatient.  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 prednisone 5 mg daily  5-Retained needle fragment.  X-ray confirms 8 mm needle fragment anterior to proximal humerus (prior surgery 05/2016, for retained needle with associated infection).   No signs or symptoms of acute infection at site.  Spoke with [name] 8/23) who agrees given asymptomatic, location(not in joint) no need for surgical intervention  Polysubstance abuse;  Patient will follow put patient rehab.   Urinary retention and stool incontinence, resolved. Briefly required Foley for urinary retention which is now resolved.  Discdontinue Flomax andbladder scans.  Constipation; bowel regimen.  Anemia; hb stable.   Anemia; hb stable. Low folate. Replacing.  Hb improved to 9.    DVT prophylaxis:  Code  Status: full code.  Family Communication: care discussed with patient.  Disposition Plan: remain in the hospital for IV antibiotics.     Consultants:   ID   Cardiology.      Procedures:   12/05/17: L3, L4, L5 and S1 laminectomy for drainage of lumbar epidural abscess   TTE: 12/03/17 Study Conclusions; Vegetation noted on the septal leaflet of the tricuspid valve consistent with endocarditis. Cannot exlude vegetation on the anterior leaflet of the mitral valve. Suggest TEE to better assess.   Antimicrobials: Cefazolin 7-26  Subjective Still with numbness left LE , calf.   Objective: Vitals:   01/10/18 2211 01/11/18 0500 01/11/18 0510 01/11/18 1334  BP: 108/75  104/71 114/82  Pulse: 84  69 (!) 103  Resp:   16 18  Temp: 97.9 F (36.6 C)  97.7 F (36.5 C) 98.5 F (36.9 C)  TempSrc: Oral  Oral Oral  SpO2: 100%  98% 96%  Weight:  82.1 kg    Height:        Intake/Output Summary (Last 24 hours) at 01/11/2018 1547 Last data filed at 01/11/2018 0900 Gross per 24 hour  Intake 1089 ml  Output -  Net 1089 ml   Filed Weights   01/08/18 2100 01/09/18 0434 01/11/18 0500  Weight: 81.7 kg 81.7 kg 82.1 kg    Examination:  General exam: NAD Respiratory system:CTA Cardiovascular system: S 1, S 2 RRR Gastrointestinal system: BS present, soft, nt Central nervous system: alert, follows command.  Extremities: No edema Skin: no rashes.    Data Reviewed: I have personally reviewed following labs and imaging studies  CBC: Recent Labs  Lab 01/06/18 0344 01/11/18 0528  WBC 5.8 6.1  HGB 8.8* 9.3*  HCT 29.2* 30.7*  MCV 92.1 92.5  PLT 349 536   Basic Metabolic Panel: Recent Labs  Lab 01/06/18 0344 01/10/18 1636 01/11/18 0528  NA 135 136 137  K 4.3 4.8 4.0  CL 97* 99 99  CO2 31 26 31   GLUCOSE 96 110* 103*  BUN 21* 19 19  CREATININE 0.62 0.63 0.63  CALCIUM 9.2 9.5 9.5   GFR: Estimated Creatinine Clearance: 110.2 mL/min (by C-G formula based on SCr of 0.63 mg/dL). Liver Function Tests: No results for input(s): AST, ALT, ALKPHOS, BILITOT, PROT, ALBUMIN in the last 168 hours. No results for  input(s): LIPASE, AMYLASE in the last 168 hours. No results for input(s): AMMONIA in the last 168 hours. Coagulation Profile: No results for input(s): INR, PROTIME in the last 168 hours. Cardiac Enzymes: No results for input(s): CKTOTAL, CKMB, CKMBINDEX, TROPONINI in the last 168 hours. BNP (last 3 results) No results for input(s): PROBNP in the last 8760 hours. HbA1C: No results for input(s): HGBA1C in the last 72 hours. CBG: No results for input(s): GLUCAP in the last 168 hours. Lipid Profile: No results for input(s): CHOL, HDL, LDLCALC, TRIG, CHOLHDL, LDLDIRECT in the last 72 hours. Thyroid Function Tests: No results for input(s): TSH, T4TOTAL, FREET4, T3FREE, THYROIDAB in the last 72 hours. Anemia Panel: Recent Labs    01/09/18 0428  VITAMINB12 415  FOLATE 5.5*  FERRITIN 36  TIBC 407  IRON 44  RETICCTPCT 3.3*   Sepsis Labs: No results for input(s): PROCALCITON, LATICACIDVEN in the last 168 hours.  Recent Results (from the past 240 hour(s))  Culture, blood (routine x 2)     Status: None (Preliminary result)   Collection Time: 01/09/18 12:02 AM  Result Value Ref Range Status   Specimen Description BLOOD LEFT HAND  Final   Special Requests   Final    BOTTLES DRAWN AEROBIC ONLY Blood Culture results may not be optimal due to an inadequate volume of blood received in culture bottles   Culture   Final    NO GROWTH 2 DAYS Performed at [facility], [place] [address] [place], [place] [address]    Report Status PENDING  Incomplete  Culture, blood (routine x 2)     Status: None (Preliminary result)   Collection Time: 01/09/18 12:08 AM  Result Value Ref Range Status   Specimen Description BLOOD BLOOD LEFT FOREARM  Final   Special Requests   Final    BOTTLES DRAWN AEROBIC ONLY Blood Culture results may not be optimal due to an inadequate volume of blood received in culture bottles   Culture   Final    NO GROWTH 2 DAYS Performed at [facility], [place] [address], [place], [place] [address]    Report Status PENDING  Incomplete         Radiology Studies: Mr Lumbar Spine W Wo Contrast  Result Date: 01/10/2018 CLINICAL DATA:  Numbness and tingling. History of lumbar spine surgery December 05, 2017, intravenous drug abuse. EXAM: MRI LUMBAR SPINE WITHOUT AND WITH CONTRAST TECHNIQUE: Multiplanar and multiecho pulse sequences of the lumbar spine were obtained without and with intravenous contrast. Per technologist note, metal suppression technique utilized. CONTRAST:  14m MULTIHANCE GADOBENATE DIMEGLUMINE 529 MG/ML IV SOLN COMPARISON:  MRI lumbar spine December 24, 2017 FINDINGS: Spinal hardware present resulting in extensive susceptibility artifact. SEGMENTATION: For the purposes of this report, the last well-formed intervertebral disc is reported as L5-S1. ALIGNMENT: Maintained lumbar lordosis. No definite malalignment. Thoracic through L3 Harrington rods with laminar hooks again noted. VERTEBRAE:Vertebral bodies are intact. Stable moderate L4-5 disc height loss. Moderate to severe L5-S1 disc height loss progressed from prior imaging. Disc desiccation lower lumbar discs with superimposed disc edema L5-S1 with endplate enhancement and enhancement of the posterior L5-S1 disc. No definite abnormal enhancement of the sacrum. CONUS MEDULLARIS AND CAUDA EQUINA: Conus medullaris obscured. Persistent though improved enhancement of the cauda equina from L3-4 caudally, obscured above this level. Persistent though improved focal smudgy intrathecal enhancement at L4-5 towards the RIGHT (series 11, image 20). No epidural collection. PARASPINAL AND OTHER SOFT TISSUES: Denervation without convincing evidence of myositis. No focal fluid collection. DISC LEVELS: L3-4: Posterior decompression. No disc bulge, canal stenosis or neural foraminal narrowing. L4-5: Posterior decompression. Small broad-based disc bulge without canal stenosis. Mild neural foraminal narrowing. L5-S1: Posterior  decompression. Small, decreased disc bulge with enhancement versus residual infection improved from prior imaging. Mild RIGHT and moderate LEFT facet arthropathy with minimal residual enhancement improved. No canal stenosis. Mild RIGHT and moderate to severe LEFT neural foraminal narrowing is similar. IMPRESSION: 1. Limited assessment due to spinal fixation rods. Status post L3-4 through L5-S1 posterior decompression without residual or recurrent epidural abscess. 2. Small residual L5-S1 disc protrusion versus abscess improved from prior examination with decrease, mild potential L5-S1 discitis osteomyelitis. 3. Persistent though improved abnormal enhancement of the cauda equina and intrathecal enhancement, possibly infectious. 4. No canal stenosis. Persistent neural foraminal narrowing L4-5 and L5-S1: Moderate to severe on the LEFT at L5-S1. Electronically Signed   By: CElon AlasM.D.   On: 01/10/2018 22:18        Scheduled Meds: . acetaminophen  1,000 mg Oral Q8H  . bupivacaine liposome  20 mL Infiltration Once  . bupivacaine-EPINEPHrine  50 mL Infiltration Once  .  cyclobenzaprine  10 mg Oral TID  . docusate sodium  100 mg Oral BID  . ferrous sulfate  325 mg Oral Q breakfast  . folic acid  1 mg Oral Daily  . heparin  5,000 Units Subcutaneous Q8H  . lactobacillus  1 g Oral TID WC  . magnesium oxide  400 mg Oral BID  . methadone  60 mg Oral Daily  . nicotine  21 mg Transdermal Daily  . pantoprazole  40 mg Oral Daily  . polyethylene glycol  17 g Oral BID  . predniSONE  5 mg Oral Q breakfast  . senna-docusate  1 tablet Oral BID  . sodium chloride flush  10-40 mL Intracatheter Q12H  . tamsulosin  0.4 mg Oral Daily   Continuous Infusions: .  ceFAZolin (ANCEF) IV 2 g (01/11/18 1337)  . lactated ringers 10 mL/hr at 01/10/18 2312  . methocarbamol (ROBAXIN) IV 100 mL/hr at 01/07/18 1947     LOS: 40 days    Time spent: 35 minutes.     Elmarie Shiley, MD [facility] Pager [phone]  If 7PM-7AM, please contact night-coverage [email or web address] Password [facility] 01/11/2018, 3:47 PM

## 2018-01-12 NOTE — Progress Notes (Signed)
PROGRESS NOTE    Kelly [name]  MRN:[id number] DOB: 09/01/1985 DOA: 12/02/2017 PCP: Patient, No Pcp Per    Brief Narrative: Kelly [name] is a 32 y.o. 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 - (HIV, Hepatitis C, AB, RPR negative ) -Needs 8 weeks of IV cefazolin--end date 9-18. -needs weekly CMP and CBC.  -per ID patient might need oral antibiotics after she complete IV. Will need to discussed with ID close to date of discharge.  -She will need to follow up with cardiology for repeat ECHO out patient. In a week or so.  -Discussed with ID, Dr Snider, patient will need repeat Blood culture near to completing antibiotics. Patient will need to be discharge on Bactrim DS BID for at least a month. C ESR down to 65 CRP 0.8 -blood culture 8-28 no growth   2-Lumbar epidural abscess, S/P laminectomy and drainage on 7-25.  -MRI; on 8/14 showed persistent discitis possible epidural soft thickening, neurosurgery at that time recommended no further intervention.  -Urine retention resolved.  -Some chronic left LE weakness.  -Report numbness LE callf,  some weakness. Discussed with Dr Jenkins plan to get MRI lumbar spine.  -MRI 8-30; Status post L3-4 through L5-S1 posterior decompression without residual or recurrent epidural abscess. Small residual L5-S1 disc protrusion versus abscess improved from prior examination with decrease, mild potential L5-S1 discitis osteomyelitis. Persistent though improved abnormal enhancement of the cauda equina and intrathecal enhancement, possibly infectious. No canal stenosis. Persistent neural foraminal narrowing L4-5 and L5-S1: Moderate to severe on the LEFT at L5-S1. Discussed MRI with Dr Stern MRI appears better than before. Continue with IV antibiotics for now  3-Severe tricuspid regurgitation;  Cardiology recommends completing IV antibiotics repeating echo as outpatient to determine further need for intervention.  4-Adrenal insufficiency;  a.m. cortisol 3.4.  ACTH within normal range inappropriately.  Concern for secondary adrenal insufficiency.  Previous physician discussed with endocrinology on 8/19 who recommended steroids follow-up as outpatient.  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 prednisone 5 mg daily  5-Retained needle fragment.  X-ray confirms 8 mm needle fragment anterior to proximal humerus (prior surgery 05/2016, for retained needle with associated infection).   No signs or symptoms of acute infection at site.  Spoke with Michael Jeffery(on 8/23) who agrees given asymptomatic, location(not in joint) no need for surgical intervention  Polysubstance abuse;  Patient will follow put patient rehab.   Urinary retention and stool incontinence, resolved. Briefly required Foley for urinary retention which is now resolved.  Discdontinue Flomax andbladder scans.  Constipation; bowel regimen.  Anemia; hb stable.   Anemia; hb stable. Low folate. Replacing.  Hb improved to   9.    DVT prophylaxis:  Code Status: full code.  Family Communication: care discussed with patient.  Disposition  Plan: remain in the hospital for IV antibiotics.    Consultants:   ID   Cardiology.      Procedures:   12/05/17: L3, L4, L5 and S1 laminectomy for drainage of lumbar epidural abscess   TTE: 12/03/17 Study Conclusions; Vegetation noted on the septal leaflet of the tricuspid valve consistent with endocarditis. Cannot exlude vegetation on the anterior leaflet of the mitral valve. Suggest TEE to better assess.   Antimicrobials: Cefazolin 7-26  Subjective She was walking today, denies worsening dyspnea. Still with numbness calf area.   Objective: Vitals:   01/11/18 1334 01/11/18 2248 01/12/18 0550 01/12/18 1302  BP: 114/82 109/72 102/69 110/72  Pulse: (!) 103 81 75 75  Resp: 18 18 18 17  Temp: 98.5 F (36.9 C) 98.3 F (36.8 C) 97.8 F (36.6 C) 97.7 F (36.5 C)  TempSrc: Oral Oral Oral Oral  SpO2: 96% 99% 98% 100%  Weight:   83 kg   Height:        Intake/Output Summary (Last 24 hours) at 01/12/2018 1312 Last data filed at 01/11/2018 1802 Gross per 24 hour  Intake 800 ml  Output -  Net 800 ml   Filed Weights   01/09/18 0434 01/11/18 0500 01/12/18 0550  Weight: 81.7 kg 82.1 kg 83 kg    Examination:  General exam: NAD Respiratory system:CTA Cardiovascular system: S 1, S 2 RRR Gastrointestinal system: BS present, soft, nt Central nervous system: Alert, follows command/  Extremities: No edema Skin: no rashes.    Data Reviewed: I have personally reviewed following labs and imaging studies  CBC: Recent Labs  Lab 01/06/18 0344 01/11/18 0528  WBC 5.8 6.1  HGB 8.8* 9.3*  HCT 29.2* 30.7*  MCV 92.1 92.5  PLT 349 363   Basic Metabolic Panel: Recent Labs  Lab 01/06/18 0344 01/10/18 1636 01/11/18 0528  NA 135 136 137  K 4.3 4.8 4.0  CL 97* 99 99  CO2 31 26 31  GLUCOSE 96 110* 103*  BUN 21* 19 19  CREATININE 0.62 0.63 0.63  CALCIUM 9.2 9.5 9.5   GFR: Estimated Creatinine Clearance: 110.8 mL/min (by C-G formula based on SCr of 0.63 mg/dL). Liver  Function Tests: No results for input(s): AST, ALT, ALKPHOS, BILITOT, PROT, ALBUMIN in the last 168 hours. No results for input(s): LIPASE, AMYLASE in the last 168 hours. No results for input(s): AMMONIA in the last 168 hours. Coagulation Profile: No results for input(s): INR, PROTIME in the last 168 hours. Cardiac Enzymes: No results for input(s): CKTOTAL, CKMB, CKMBINDEX, TROPONINI in the last 168 hours. BNP (last 3 results) No results for input(s): PROBNP in the last 8760 hours. HbA1C: No results for input(s): HGBA1C in the last 72 hours. CBG: No results for input(s): GLUCAP in the last 168 hours. Lipid Profile: No results for input(s): CHOL, HDL, LDLCALC, TRIG, CHOLHDL, LDLDIRECT in the last 72 hours. Thyroid Function Tests: No results for input(s): TSH, T4TOTAL, FREET4, T3FREE, THYROIDAB in the last 72 hours. Anemia Panel: No results for input(s): VITAMINB12, FOLATE, FERRITIN, TIBC, IRON, RETICCTPCT in the last 72 hours. Sepsis Labs: No results for input(s): PROCALCITON, LATICACIDVEN in the last 168 hours.  Recent Results (from the past 240 hour(s))  Culture, blood (routine x 2)     Status: None (Preliminary result)   Collection Time: 01/09/18 12:02 AM  Result Value Ref Range Status   Specimen   Description BLOOD LEFT HAND  Final   Special Requests   Final    BOTTLES DRAWN AEROBIC ONLY Blood Culture results may not be optimal due to an inadequate volume of blood received in culture bottles   Culture   Final    NO GROWTH 3 DAYS Performed at [facility], [place] [address], [place], [place] [address]    Report Status PENDING  Incomplete  Culture, blood (routine x 2)     Status: None (Preliminary result)   Collection Time: 01/09/18 12:08 AM  Result Value Ref Range Status   Specimen Description BLOOD BLOOD LEFT FOREARM  Final   Special Requests   Final    BOTTLES DRAWN AEROBIC ONLY Blood Culture results may not be optimal due to an inadequate volume of blood received in  culture bottles   Culture   Final    NO GROWTH 3 DAYS Performed at [facility], [place] [address], [place], [place] [address]    Report Status PENDING  Incomplete         Radiology Studies: Mr Lumbar Spine W Wo Contrast  Result Date: 01/10/2018 CLINICAL DATA:  Numbness and tingling. History of lumbar spine surgery December 05, 2017, intravenous drug abuse. EXAM: MRI LUMBAR SPINE WITHOUT AND WITH CONTRAST TECHNIQUE: Multiplanar and multiecho pulse sequences of the lumbar spine were obtained without and with intravenous contrast. Per technologist note, metal suppression technique utilized. CONTRAST:  38m MULTIHANCE GADOBENATE DIMEGLUMINE 529 MG/ML IV SOLN COMPARISON:  MRI lumbar spine December 24, 2017 FINDINGS: Spinal hardware present resulting in extensive susceptibility artifact. SEGMENTATION: For the purposes of this report, the last well-formed intervertebral disc is reported as L5-S1. ALIGNMENT: Maintained lumbar lordosis. No definite malalignment. Thoracic through L3 Harrington rods with laminar hooks again noted. VERTEBRAE:Vertebral bodies are intact. Stable moderate L4-5 disc height loss. Moderate to severe L5-S1 disc height loss progressed from prior imaging. Disc desiccation lower lumbar discs with superimposed disc edema L5-S1 with endplate enhancement and enhancement of the posterior L5-S1 disc. No definite abnormal enhancement of the sacrum. CONUS MEDULLARIS AND CAUDA EQUINA: Conus medullaris obscured. Persistent though improved enhancement of the cauda equina from L3-4 caudally, obscured above this level. Persistent though improved focal smudgy intrathecal enhancement at L4-5 towards the RIGHT (series 11, image 20). No epidural collection. PARASPINAL AND OTHER SOFT TISSUES: Denervation without convincing evidence of myositis. No focal fluid collection. DISC LEVELS: L3-4: Posterior decompression. No disc bulge, canal stenosis or neural foraminal narrowing. L4-5: Posterior decompression.  Small broad-based disc bulge without canal stenosis. Mild neural foraminal narrowing. L5-S1: Posterior decompression. Small, decreased disc bulge with enhancement versus residual infection improved from prior imaging. Mild RIGHT and moderate LEFT facet arthropathy with minimal residual enhancement improved. No canal stenosis. Mild RIGHT and moderate to severe LEFT neural foraminal narrowing is similar. IMPRESSION: 1. Limited assessment due to spinal fixation rods. Status post L3-4 through L5-S1 posterior decompression without residual or recurrent epidural abscess. 2. Small residual L5-S1 disc protrusion versus abscess improved from prior examination with decrease, mild potential L5-S1 discitis osteomyelitis. 3. Persistent though improved abnormal enhancement of the cauda equina and intrathecal enhancement, possibly infectious. 4. No canal stenosis. Persistent neural foraminal narrowing L4-5 and L5-S1: Moderate to severe on the LEFT at L5-S1. Electronically Signed   By: CElon AlasM.D.   On: 01/10/2018 22:18        Scheduled Meds: . acetaminophen  1,000 mg Oral Q8H  . bupivacaine liposome  20 mL Infiltration Once  . bupivacaine-EPINEPHrine  50 mL Infiltration Once  . cyclobenzaprine  10 mg Oral TID  . docusate sodium  100 mg Oral BID  . ferrous sulfate  325 mg Oral Q breakfast  . folic acid  1 mg Oral Daily  . heparin  5,000 Units Subcutaneous Q8H  . lactobacillus  1 g Oral TID WC  . magnesium oxide  400 mg Oral BID  . methadone  60 mg Oral Daily  . nicotine  21 mg Transdermal Daily  . pantoprazole  40 mg Oral Daily  . polyethylene glycol  17 g Oral BID  . predniSONE  5 mg Oral Q breakfast  . senna-docusate  1 tablet Oral BID  . sodium chloride flush  10-40 mL Intracatheter Q12H  . tamsulosin  0.4 mg Oral Daily   Continuous Infusions: .  ceFAZolin (ANCEF) IV 2 g (01/12/18 0557)  . lactated ringers 10 mL/hr at 01/10/18 2312  . methocarbamol (ROBAXIN) IV 100 mL/hr at 01/07/18 1947       LOS: 41 days    Time spent: 35 minutes.     Belkys A Regalado, MD [facility] Pager [phone]  If 7PM-7AM, please contact night-coverage [email or web address] Password [email or web address] 01/12/2018, 1:12 PM  

## 2018-01-13 NOTE — Plan of Care (Signed)
  Problem: Health Behavior/Discharge Planning: Goal: Ability to manage health-related needs will improve Outcome: Progressing   Problem: Clinical Measurements: Goal: Ability to maintain clinical measurements within normal limits will improve Outcome: Progressing   Problem: Nutrition: Goal: Adequate nutrition will be maintained Outcome: Progressing   Problem: Coping: Goal: Level of anxiety will decrease Outcome: Progressing   Problem: Elimination: Goal: Will not experience complications related to urinary retention Outcome: Progressing   Problem: Pain Managment: Goal: General experience of comfort will improve Outcome: Progressing   Problem: Skin Integrity: Goal: Risk for impaired skin integrity will decrease Outcome: Progressing  Forestine Chute, RN 01/13/2018 11:53 PM

## 2018-01-13 NOTE — Progress Notes (Signed)
PT Cancellation Note  Patient Details Name: Kelly [name] MRN: [id number] DOB: 1985-12-11   Cancelled Treatment:    Reason Eval/Treat Not Completed: (P) Patient at procedure or test/unavailable(In shower and unavailable at this time, will f/u per POC.  )   [name] Eli Hose 01/13/2018, 5:23 PM  Governor Rooks, PTA pager [phone]

## 2018-01-13 NOTE — Progress Notes (Signed)
PROGRESS NOTE    Kelly [name]  WLN:[id number] DOB: 1986-02-28 DOA: 12/02/2017 PCP: Patient, No Pcp Per    Brief Narrative: Kelly [name] is a 32 y.o. 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 - (HIV, Hepatitis C, AB, RPR negative ) -Needs 8 weeks of IV cefazolin--end date 9-18. -needs weekly CMP and CBC.  -per ID patient might need oral antibiotics after she complete IV. Will need to discussed with ID close to date of discharge.  -She will need to follow up with cardiology for repeat ECHO out patient. In a week or so.  -Discussed with ID, Dr Baxter Flattery, patient will need repeat Blood culture near to completing antibiotics. Patient will need to be discharge on Bactrim DS BID for at least a month. C ESR down to 65 CRP 0.8 -blood culture 8-28 No growth   2-Lumbar epidural abscess, S/P laminectomy and drainage on 7-25.  -MRI; on 8/14 showed persistent discitis possible epidural soft thickening, neurosurgery at that time recommended no further intervention.  -Urine retention resolved.  -Some chronic left LE weakness.  -Report numbness LE callf,  some weakness. Discussed with Dr Arnoldo Morale plan to get MRI lumbar spine.  -MRI 8-30; Status post L3-4 through L5-S1 posterior decompression without residual or recurrent epidural abscess. Small residual L5-S1 disc protrusion versus abscess improved from prior examination with decrease, mild potential L5-S1 discitis osteomyelitis. Persistent though improved abnormal enhancement of the cauda equina and intrathecal enhancement, possibly infectious. No canal stenosis. Persistent neural foraminal narrowing L4-5 and L5-S1: Moderate to severe on the LEFT at L5-S1. Discussed MRI with Dr Vertell Limber MRI appears better than before. Continue with IV antibiotics for now  3-Severe tricuspid regurgitation;  Cardiology recommends completing IV antibiotics repeating echo as outpatient to determine further need for intervention.  4-Adrenal insufficiency;  a.m. cortisol 3.4.  ACTH within normal range inappropriately.  Concern for secondary adrenal insufficiency.  Previous physician discussed with endocrinology on 8/19 who recommended steroids follow-up as outpatient.  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 prednisone 5 mg daily  5-Retained needle fragment.  X-ray confirms 8 mm needle fragment anterior to proximal humerus (prior surgery 05/2016, for retained needle with associated infection).   No signs or symptoms of acute infection at site.  Spoke with [name] 8/23) who agrees given asymptomatic, location(not in joint) no need for surgical intervention  Polysubstance abuse;  Patient will follow put patient rehab.   Urinary retention and stool incontinence, resolved. Briefly required Foley for urinary retention which is now resolved.  Discdontinue Flomax andbladder scans.  Constipation; bowel regimen.  Anemia; hb stable.   Anemia; hb stable. Low folate. Replacing.  Hb improved to  9.    DVT prophylaxis:  Code Status: full code.  Family Communication: care discussed with patient.  Disposition  Plan: remain in the hospital for IV antibiotics.    Consultants:   ID   Cardiology.      Procedures:   12/05/17: L3, L4, L5 and S1 laminectomy for drainage of lumbar epidural abscess   TTE: 12/03/17 Study Conclusions; Vegetation noted on the septal leaflet of the tricuspid valve consistent with endocarditis. Cannot exlude vegetation on the anterior leaflet of the mitral valve. Suggest TEE to better assess.   Antimicrobials: Cefazolin 7-26  Subjective Report neuro deficit stable. No worsening weakness.    Objective: Vitals:   01/12/18 0550 01/12/18 1302 01/12/18 2137 01/13/18 0620  BP: 102/69 110/72 102/68 102/71  Pulse: 75 75 98 83  Resp: 18 17 18 18   Temp: 97.8 F (36.6 C) 97.7 F (36.5 C) 98.2 F (36.8 C) 97.6 F (36.4 C)  TempSrc: Oral Oral Oral Oral  SpO2: 98% 100% 100% 99%  Weight: 83 kg     Height:        Intake/Output Summary (Last 24 hours) at 01/13/2018 1303 Last data filed at 01/12/2018 1504 Gross per 24 hour  Intake 308 ml  Output -  Net 308 ml   Filed Weights   01/09/18 0434 01/11/18 0500 01/12/18 0550  Weight: 81.7 kg 82.1 kg 83 kg    Examination:  General exam: NAD Respiratory system:CTA Cardiovascular system: S 1 S  2 RRR Gastrointestinal system: BS present, soft, nt Central nervous system: alert, follows command.  Extremities: no edema Skin: no rashes.    Data Reviewed: I have personally reviewed following labs and imaging studies  CBC: Recent Labs  Lab 01/11/18 0528  WBC 6.1  HGB 9.3*  HCT 30.7*  MCV 92.5  PLT 660   Basic Metabolic Panel: Recent Labs  Lab 01/10/18 1636 01/11/18 0528  NA 136 137  K 4.8 4.0  CL 99 99  CO2 26 31  GLUCOSE 110* 103*  BUN 19 19  CREATININE 0.63 0.63  CALCIUM 9.5 9.5   GFR: Estimated Creatinine Clearance: 110.8 mL/min (by C-G formula based on SCr of 0.63 mg/dL). Liver Function Tests: No results for input(s): AST, ALT, ALKPHOS, BILITOT, PROT, ALBUMIN in the last 168 hours. No  results for input(s): LIPASE, AMYLASE in the last 168 hours. No results for input(s): AMMONIA in the last 168 hours. Coagulation Profile: No results for input(s): INR, PROTIME in the last 168 hours. Cardiac Enzymes: No results for input(s): CKTOTAL, CKMB, CKMBINDEX, TROPONINI in the last 168 hours. BNP (last 3 results) No results for input(s): PROBNP in the last 8760 hours. HbA1C: No results for input(s): HGBA1C in the last 72 hours. CBG: No results for input(s): GLUCAP in the last 168 hours. Lipid Profile: No results for input(s): CHOL, HDL, LDLCALC, TRIG, CHOLHDL, LDLDIRECT in the last 72 hours. Thyroid Function Tests: No results for input(s): TSH, T4TOTAL, FREET4, T3FREE, THYROIDAB in the last 72 hours. Anemia Panel: No results for input(s): VITAMINB12, FOLATE, FERRITIN, TIBC, IRON, RETICCTPCT in the last 72 hours. Sepsis Labs: No results for input(s): PROCALCITON, LATICACIDVEN in the last 168 hours.  Recent Results (from the past 240 hour(s))  Culture, blood (routine x 2)     Status: None (Preliminary result)   Collection Time: 01/09/18 12:02 AM  Result Value Ref Range Status   Specimen Description BLOOD LEFT HAND  Final   Special Requests   Final    BOTTLES DRAWN AEROBIC ONLY Blood  Culture results may not be optimal due to an inadequate volume of blood received in culture bottles   Culture   Final    NO GROWTH 4 DAYS Performed at [facility], [place] [address] [place], [place] [address]    Report Status PENDING  Incomplete  Culture, blood (routine x 2)     Status: None (Preliminary result)   Collection Time: 01/09/18 12:08 AM  Result Value Ref Range Status   Specimen Description BLOOD BLOOD LEFT FOREARM  Final   Special Requests   Final    BOTTLES DRAWN AEROBIC ONLY Blood Culture results may not be optimal due to an inadequate volume of blood received in culture bottles   Culture   Final    NO GROWTH 4 DAYS Performed at [facility], [place] [address],  [place], [place] [address]    Report Status PENDING  Incomplete         Radiology Studies: No results found.      Scheduled Meds: . acetaminophen  1,000 mg Oral Q8H  . bupivacaine liposome  20 mL Infiltration Once  . bupivacaine-EPINEPHrine  50 mL Infiltration Once  . cyclobenzaprine  10 mg Oral TID  . docusate sodium  100 mg Oral BID  . ferrous sulfate  325 mg Oral Q breakfast  . folic acid  1 mg Oral Daily  . heparin  5,000 Units Subcutaneous Q8H  . lactobacillus  1 g Oral TID WC  . magnesium oxide  400 mg Oral BID  . methadone  60 mg Oral Daily  . nicotine  21 mg Transdermal Daily  . pantoprazole  40 mg Oral Daily  . polyethylene glycol  17 g Oral BID  . predniSONE  5 mg Oral Q breakfast  . senna-docusate  1 tablet Oral BID  . sodium chloride flush  10-40 mL Intracatheter Q12H  . tamsulosin  0.4 mg Oral Daily   Continuous Infusions: .  ceFAZolin (ANCEF) IV 2 g (01/13/18 0658)  . lactated ringers 10 mL/hr at 01/10/18 2312  . methocarbamol (ROBAXIN) IV 100 mL/hr at 01/07/18 1947     LOS: 42 days    Time spent: 35 minutes.     Elmarie Shiley, MD [facility] Pager [phone]  If 7PM-7AM, please contact night-coverage [email or web address] Password [email or web address] 01/13/2018, 1:03 PM

## 2018-01-14 LAB — BASIC METABOLIC PANEL
ANION GAP: 10 (ref 5–15)
BUN: 16 mg/dL (ref 6–20)
CALCIUM: 9.2 mg/dL (ref 8.9–10.3)
CHLORIDE: 98 mmol/L (ref 98–111)
CO2: 28 mmol/L (ref 22–32)
Creatinine, Ser: 0.59 mg/dL (ref 0.44–1.00)
GFR calc Af Amer: >60 mL/min (ref >60)
GFR calc non Af Amer: >60 mL/min (ref >60)
GLUCOSE: 89 mg/dL (ref 70–99)
Potassium: 4.1 mmol/L (ref 3.5–5.1)
Sodium: 136 mmol/L (ref 135–145)

## 2018-01-14 LAB — CULTURE, BLOOD (ROUTINE X 2)
CULTURE: NO GROWTH
CULTURE: NO GROWTH

## 2018-01-14 LAB — CBC
HEMATOCRIT: 30.5 % — AB (ref 36.0–46.0)
HEMOGLOBIN: 9.1 g/dL — AB (ref 12.0–15.0)
MCH: 27.9 pg (ref 26.0–34.0)
MCHC: 29.8 g/dL — ABNORMAL LOW (ref 30.0–36.0)
MCV: 93.6 fL (ref 78.0–100.0)
Platelets: 346 10*3/uL (ref 150–400)
RBC: 3.26 MIL/uL — ABNORMAL LOW (ref 3.87–5.11)
RDW: 15.8 % — AB (ref 11.5–15.5)
WBC: 6.3 10*3/uL (ref 4.0–10.5)

## 2018-01-14 NOTE — Plan of Care (Signed)
  Problem: Clinical Measurements: Goal: Diagnostic test results will improve Outcome: Progressing Goal: Cardiovascular complication will be avoided Outcome: Progressing   Problem: Nutrition: Goal: Adequate nutrition will be maintained Outcome: Progressing   Problem: Pain Managment: Goal: General experience of comfort will improve Outcome: Progressing

## 2018-01-14 NOTE — Progress Notes (Signed)
PROGRESS NOTE    Kelly [name]  HYI:[id number] DOB: [birth year] DOA: 12/02/2017 PCP: Patient, No Pcp Per    Brief Narrative: Kelly [name] is a 32 y.o. 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 Patient was found to have epidural abscess. Patient  underwent  laminectomy and drainage on 7-25.  Patient has remain on IV antibiotics, Cefazolin. She needs 8 weeks of IV antibiotics. She will need to be discharge on Oral Bactrim DS BID for at least a month. She will need repeat ECHO 4 weeks after she has completed antibiotics. She will need repeated Blood culture close to finishing antibiotics. She will need to follow up with cardiology and ID after discharge. Last dose of IV antibiotic 9-18.   She develops numbness on left calf, MRI lumbar spine was repeated which was reviewed by Neurosurgery, notice improvement of finding.    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 - (HIV, Hepatitis C, AB, RPR negative ) -Needs 8 weeks of IV cefazolin--end date 9-18. -needs weekly CMP and CBC.  -per ID patient might need oral antibiotics after she complete IV.  -She will need to follow up with cardiology for  repeat ECHO out patient.  -Discussed with ID, Dr Baxter Flattery, patient will need repeat Blood culture near to completing antibiotics. Patient will need to be discharge on Bactrim DS BID for at least a month. C ESR down to 65 CRP 0.8 -blood culture 8-28 No growth   2-Lumbar epidural abscess, S/P laminectomy and drainage on 7-25.  -MRI; on 8/14 showed persistent discitis possible epidural soft thickening, neurosurgery at that time recommended no further intervention.  -Urine retention resolved.  -Some chronic left LE weakness.  -Report numbness LE callf, some weakness. Discussed with Dr Arnoldo Morale plan to get MRI lumbar spine.  -MRI 8-30; Status post L3-4 through L5-S1 posterior decompression without residual or recurrent epidural abscess. Small residual L5-S1 disc protrusion versus abscess improved from prior examination with decrease, mild potential L5-S1 discitis osteomyelitis. Persistent though improved abnormal enhancement of the cauda equina and intrathecal enhancement, possibly infectious. No canal stenosis. Persistent neural foraminal narrowing L4-5 and L5-S1: Moderate to severe on the LEFT at L5-S1. Discussed MRI with Dr Vertell Limber MRI appears better than before. Continue with IV antibiotics for now  3-Severe tricuspid regurgitation;  Cardiology recommends completing IV antibiotics repeating echo as outpatient to determine further need for intervention.  4-Adrenal insufficiency;  a.m. cortisol 3.4.  ACTH within normal range inappropriately.  Concern for secondary adrenal insufficiency.  Previous physician discussed with endocrinology on 8/19 who recommended steroids follow-up as outpatient.  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 prednisone 5 mg daily  5-Retained needle fragment.  X-ray confirms 8 mm needle fragment  anterior to proximal humerus (prior surgery 05/2016, for retained needle with associated infection).   No signs or symptoms of acute infection at site.  Spoke with [name] 8/23) who agrees given asymptomatic, location(not in joint) no need for surgical intervention  Polysubstance abuse;  Patient will follow put patient rehab.   Urinary retention and stool incontinence, resolved. Briefly required Foley for urinary retention which is now resolved.  Discdontinue Flomax andbladder scans.  Constipation; bowel regimen.  Anemia; hb stable.   Anemia; hb stable. Low folate. Replacing.  Hb improved to 9.   Left LE numbness. Will check doppler.   DVT prophylaxis:  Code Status: full code.  Family Communication: care discussed with patient.  Disposition Plan: remain in the hospital for IV antibiotics.    Consultants:   ID   Cardiology.      Procedures:   12/05/17: L3, L4, L5 and S1 laminectomy for drainage of lumbar epidural abscess   TTE: 12/03/17 Study Conclusions; Vegetation noted on the septal leaflet of the tricuspid valve consistent with endocarditis. Cannot exlude vegetation on the anterior leaflet of the mitral valve. Suggest TEE to better assess.   Antimicrobials: Cefazolin 7-26  Subjective Left LE with no worsening weakness. She continue  to have numbness calf.   Objective: Vitals:   01/13/18 1316 01/13/18 2127 01/14/18 0456 01/14/18 1409  BP: 121/67 115/75 101/65 125/75  Pulse: 80 88 78 (!) 110  Resp: _0 Temp: 97.8 F (36.6 C) 98.1 F (36.7 C) 97.8 F (36.6 C) 98.2 F (36.8 C)  TempSrc: Oral Oral Oral Oral  SpO2: 99% 100% 98% 98%  Weight:      Height:        Intake/Output Summary (Last 24 hours) at 01/14/2018 1609 Last data filed at 01/14/2018 0454 Gross per 24 hour  Intake 10 ml  Output -  Net 10 ml   Filed Weights   01/09/18 0434 01/11/18 0500 01/12/18 0550  Weight: 81.7 kg 82.1 kg 83 kg    Examination:  General exam: NAD Respiratory system:CTA Cardiovascular system: S 1, S 2 RRR Gastrointestinal system: BS present, soft, nt Central nervous system: Alert, Left LE 4/5 Extremities: no  edema Skin: no rashes.    Data Reviewed: I have personally reviewed following labs and imaging studies  CBC: Recent Labs  Lab 01/11/18 0528 01/14/18 0449  WBC 6.1 6.3  HGB 9.3* 9.1*  HCT 30.7* 30.5*  MCV 92.5 93.6  PLT 363 212   Basic Metabolic Panel: Recent Labs  Lab 01/10/18 1636 01/11/18 0528 01/14/18 0449  NA 136 137 136  K 4.8 4.0 4.1  CL 99 99 98  CO2 _1 GLUCOSE 110* 103* 89  BUN _2 CREATININE 0.63 0.63 0.59  CALCIUM 9.5 9.5 9.2   GFR: Estimated Creatinine Clearance: 110.8 mL/min (by C-G formula based on SCr of 0.59 mg/dL). Liver Function Tests: No results for input(s): AST, ALT, ALKPHOS, BILITOT, PROT, ALBUMIN in the last 168 hours. No results for input(s): LIPASE, AMYLASE in the last 168 hours. No results for input(s): AMMONIA in the last 168 hours. Coagulation Profile: No results for input(s): INR, PROTIME in the last 168 hours. Cardiac Enzymes: No results for input(s): CKTOTAL, CKMB, CKMBINDEX, TROPONINI in the last 168 hours. BNP (last 3 results) No results for input(s): PROBNP in the last 8760 hours. HbA1C: No results for input(s): HGBA1C in the last 72 hours. CBG: No results for input(s): GLUCAP in the last 168 hours.  Lipid Profile: No results for input(s): CHOL, HDL, LDLCALC, TRIG, CHOLHDL, LDLDIRECT in the last 72 hours. Thyroid Function Tests: No results for input(s): TSH, T4TOTAL, FREET4, T3FREE, THYROIDAB in the last 72 hours. Anemia Panel: No results for input(s): VITAMINB12, FOLATE, FERRITIN, TIBC, IRON, RETICCTPCT in the last 72 hours. Sepsis Labs: No results for input(s): PROCALCITON, LATICACIDVEN in the last 168 hours.  Recent Results (from the past 240 hour(s))  Culture, blood (routine x 2)     Status: None   Collection Time: 01/09/18 12:02 AM  Result Value Ref Range Status   Specimen Description BLOOD LEFT HAND  Final   Special Requests   Final    BOTTLES DRAWN AEROBIC ONLY Blood Culture results may not be optimal  due to an inadequate volume of blood received in culture bottles   Culture   Final    NO GROWTH 5 DAYS Performed at [facility], [place] [address], [place], [place] [address]    Report Status 01/14/2018 FINAL  Final  Culture, blood (routine x 2)     Status: None   Collection Time: 01/09/18 12:08 AM  Result Value Ref Range Status   Specimen Description BLOOD BLOOD LEFT FOREARM  Final   Special Requests   Final    BOTTLES DRAWN AEROBIC ONLY Blood Culture results may not be optimal due to an inadequate volume of blood received in culture bottles   Culture   Final    NO GROWTH 5 DAYS Performed at [facility], [place] [address] [place], [place] [address]    Report Status 01/14/2018 FINAL  Final         Radiology Studies: No results found.      Scheduled Meds: . acetaminophen  1,000 mg Oral Q8H  . bupivacaine liposome  20 mL Infiltration Once  . bupivacaine-EPINEPHrine  50 mL Infiltration Once  . cyclobenzaprine  10 mg Oral TID  . docusate sodium  100 mg Oral BID  . ferrous sulfate  325 mg Oral Q breakfast  . folic acid  1 mg Oral Daily  . heparin  5,000 Units Subcutaneous Q8H  . lactobacillus  1 g Oral TID WC  . magnesium oxide  400 mg Oral BID  . methadone  60 mg Oral Daily  . nicotine  21 mg Transdermal Daily  . pantoprazole  40 mg Oral Daily  . polyethylene glycol  17 g Oral BID  . predniSONE  5 mg Oral Q breakfast  . senna-docusate  1 tablet Oral BID  . sodium chloride flush  10-40 mL Intracatheter Q12H  . tamsulosin  0.4 mg Oral Daily   Continuous Infusions: .  ceFAZolin (ANCEF) IV 2 g (01/14/18 1310)  . lactated ringers 10 mL/hr at 01/10/18 2312  . methocarbamol (ROBAXIN) IV 100 mL/hr at 01/07/18 1947     LOS: 43 days    Time spent: 35 minutes.     Elmarie Shiley, MD [facility] Pager [phone]  If 7PM-7AM, please contact night-coverage [email or web address] Password [email or web address] 01/14/2018, 4:09 PM

## 2018-01-14 NOTE — Progress Notes (Signed)
Occupational Therapy Treatment Patient Details Name: Kelly Kelly [name] MRN: [id number] DOB: 1985-09-22 Today's Date: 01/14/2018    History of present illness Kelly Kelly [name] is a 32 y.o. F with sepsis, tachycardia, tachypnea, leukocytosis, hypotension.  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 PMH of polysubstance abuse, ADD, scoliosis sp back surgery (20 years ago per mom). PICC placed 12/08/17.   OT comments  Pt demonstrating progress toward OT goals this session. She is able to complete toileting tasks and standing grooming tasks with overall supervision using RW today. Simulated IADL participation with task to put away linens at countertop without UE support and pt was able to complete with guarding assistance and no overt loss of balance. She was additionally able to complete short distance mobility in her room without RW and with min handheld assistance. Updated D/C recommendation to outpatient OT follow-up as pt unable to qualify for CIR and will continue to follow while admitted.   Follow Up Recommendations  Supervision/Assistance - 24 hour;Outpatient OT    Equipment Recommendations  3 in 1 bedside commode;Wheelchair (measurements OT);Wheelchair cushion (measurements OT)    Recommendations for Other Services Rehab consult    Precautions / Restrictions Precautions Precautions: Back;Fall Precaution Booklet Issued: No Precaution Comments: Reviewed back precautions related to bed mobility.  Restrictions Weight Bearing Restrictions: No       Mobility Bed Mobility Overal bed mobility: Needs Assistance Bed Mobility: Rolling;Sidelying to Sit Rolling: Supervision Sidelying to sit: Supervision       General bed mobility comments: Supervision for safety.   Transfers Overall transfer level: Needs assistance Equipment used: Rolling walker (2 wheeled);None Transfers: Sit to/from Stand Sit to Stand:  Supervision         General transfer comment: Able to stand with supervision with and without RW. Increased assistance for attempted mobility without RW.     Balance Overall balance assessment: Needs assistance Sitting-balance support: No upper extremity supported;Feet supported Sitting balance-Leahy Scale: Good     Standing balance support: Bilateral upper extremity supported;Single extremity supported Standing balance-Leahy Scale: Fair Standing balance comment: Able to stand at counter without UE support to put away linens.                            ADL either performed or assessed with clinical judgement   ADL Overall ADL's : Needs assistance/impaired Eating/Feeding: Set up;Bed level   Grooming: Supervision/safety;Standing                   Toilet Transfer: Minimal assistance;Supervision/safety;RW;Regular Glass blower/designer Details (indicate cue type and reason): Supervision with RW. Taking 4 steps forward and backward from bed without RW requiring min handheld assistnce.  Toileting- Clothing Manipulation and Hygiene: Supervision/safety;Sitting/lateral lean       Functional mobility during ADLs: Supervision/safety;Minimal assistance;Rolling walker(Supervision with RW; min assist without) General ADL Comments: Simulated home management tasks with bed making task as well as putting linens on shelves without UE support standing at countertop.      Vision       Perception     Praxis      Cognition Arousal/Alertness: Awake/alert Behavior During Therapy: WFL for tasks assessed/performed Overall Cognitive Status: Within Functional Limits for tasks assessed  Exercises     Shoulder Instructions       General Comments      Pertinent Vitals/ Pain       Pain Assessment: Faces Faces Pain Scale: Hurts Kelly [name] more Pain Location: Lower back Pain Descriptors / Indicators:  Grimacing;Guarding Pain Intervention(s): Monitored during session;Repositioned;RN gave pain meds during session  Home Living                                          Prior Functioning/Environment              Frequency  Min 2X/week        Progress Toward Goals  OT Goals(current goals can now be found in the care plan section)  Progress towards OT goals: Progressing toward goals  Acute Rehab OT Goals Patient Stated Goal: go through intensive therapy before going home.  OT Goal Formulation: With patient Time For Goal Achievement: 01/21/18 Potential to Achieve Goals: Good  Plan Frequency remains appropriate;Discharge plan remains appropriate    Co-evaluation                 AM-PAC PT "6 Clicks" Daily Activity     Outcome Measure   Help from another person eating meals?: None Help from another person taking care of personal grooming?: None Help from another person toileting, which includes using toliet, bedpan, or urinal?: A Little Help from another person bathing (including washing, rinsing, drying)?: A Little Help from another person to put on and taking off regular upper body clothing?: A Little Help from another person to put on and taking off regular lower body clothing?: A Little 6 Click Score: 20    End of Session Equipment Utilized During Treatment: Gait belt;Rolling walker  OT Visit Diagnosis: Unsteadiness on feet (R26.81);Other abnormalities of gait and mobility (R26.89);Pain Pain - Right/Left: Left Pain - part of body: (back)   Activity Tolerance Patient tolerated treatment well   Patient Left in bed;with call bell/phone within reach   Nurse Communication Mobility status        Time: 1308-6578 OT Time Calculation (min): 18 min  Charges: OT General Charges $OT Visit: 1 Visit OT Treatments $Self Care/Home Management : 8-22 mins  [place] Pager [phone] Office  [place] A Cosby Proby 01/14/2018, 1:43 PM

## 2018-01-14 NOTE — Progress Notes (Signed)
Physical Therapy Treatment Patient Details Name: Kelly [name] MRN: [id number] DOB: 1986/01/16 Today's Date: 01/14/2018    History of Present Illness Kelly [name] is a 32 y.o. F with sepsis, tachycardia, tachypnea, leukocytosis, hypotension.  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 PMH of polysubstance abuse, ADD, scoliosis sp back surgery (20 years ago per mom). PICC placed 12/08/17.    PT Comments    Pt working on higher level strengthening and balance skills today including ambulation on inclines and controlled squatting at various levels. Decreased neuromuscular control and lack of sensation of LLE continue to affect balance and functional mobility. Pt concerned about weight gain since she has been hospitalized, discussed exercises that are safe for her to perform in room but she would also like guidance from cardiology about HR parameters for exercise when she d/c home. PT will continue to follow.    Follow Up Recommendations  Outpatient PT;Supervision for mobility/OOB     Equipment Recommendations  Rolling walker with 5" wheels;3in1 (PT)    Recommendations for Other Services       Precautions / Restrictions Precautions Precautions: Back;Fall Precaution Booklet Issued: No Precaution Comments: Reviewed back precautions related to bed mobility.  Restrictions Weight Bearing Restrictions: No    Mobility  Bed Mobility Overal bed mobility: Needs Assistance Bed Mobility: Rolling;Sidelying to Sit Rolling: Supervision Sidelying to sit: Supervision       General bed mobility comments: pt up EOB upon PT arrival  Transfers Overall transfer level: Needs assistance Equipment used: Rolling walker (2 wheeled);None Transfers: Sit to/from Stand Sit to Stand: Supervision         General transfer comment: practiced sit to stand without RW and without use of hands, supervision for safety  because pt occasionally loses balance once she's up and has to sit back down on bed  Ambulation/Gait Ambulation/Gait assistance: Supervision Gait Distance (Feet): 500 Feet Assistive device: Rolling walker (2 wheeled) Gait Pattern/deviations: Step-through pattern;Trendelenburg(trendelenburg on R LE) Gait velocity: Decreased Gait velocity interpretation: 1.31 - 2.62 ft/sec, indicative of limited community ambulator General Gait Details: worked on down and uphill walking with control. HR 123 bpm after uphill, O2 sats 98%   Stairs             Wheelchair Mobility    Modified Rankin (Stroke Patients Only)       Balance Overall balance assessment: Needs assistance Sitting-balance support: No upper extremity supported;Feet supported Sitting balance-Leahy Scale: Good Sitting balance - Comments: able to sit EOB independently w/o support with dynamic activity   Standing balance support: Bilateral upper extremity supported;No upper extremity supported Standing balance-Leahy Scale: Good Standing balance comment: limitations evident with dynamic activity, decreased ability to compensate for perturbations to L                            Cognition Arousal/Alertness: Awake/alert Behavior During Therapy: WFL for tasks assessed/performed Overall Cognitive Status: Within Functional Limits for tasks assessed                                        Exercises Other Exercises Other Exercises: lateral walking 5' bilaterally x5 Other Exercises: partial squats with 10 sec hold at halfway, 5x Other Exercises: slow sit <> stand from rhomberg stance and wide stance x5 each, has difficulty with last 6"  of sit    General Comments        Pertinent Vitals/Pain Pain Assessment: Faces Faces Pain Scale: Hurts little more Pain Location: Lower back Pain Descriptors / Indicators: Grimacing;Guarding Pain Intervention(s): Monitored during session    Home Living                       Prior Function            PT Goals (current goals can now be found in the care plan section) Acute Rehab PT Goals Patient Stated Goal: go through intensive therapy before going home.  PT Goal Formulation: With patient Time For Goal Achievement: 01/17/18 Potential to Achieve Goals: Good Progress towards PT goals: Progressing toward goals    Frequency    Min 3X/week      PT Plan Current plan remains appropriate    Co-evaluation              AM-PAC PT "6 Clicks" Daily Activity  Outcome Measure  Difficulty turning over in bed (including adjusting bedclothes, sheets and blankets)?: A Little Difficulty moving from lying on back to sitting on the side of the bed? : A Little Difficulty sitting down on and standing up from a chair with arms (e.g., wheelchair, bedside commode, etc,.)?: A Little Help needed moving to and from a bed to chair (including a wheelchair)?: A Little Help needed walking in hospital room?: A Little Help needed climbing 3-5 steps with a railing? : A Little 6 Click Score: 18    End of Session   Activity Tolerance: Patient tolerated treatment well Patient left: in bed;with call bell/phone within reach;Other (comment)(sitting EOB) Nurse Communication: Mobility status PT Visit Diagnosis: Other abnormalities of gait and mobility (R26.89);Pain Pain - Right/Left: Left Pain - part of body: Hip     Time: 1455-1530 PT Time Calculation (min) (ACUTE ONLY): 35 min  Charges:  $Gait Training: 8-22 mins $Therapeutic Exercise: 8-22 mins                     Kelly [name], PT  Acute Rehab Services  [place] 01/14/2018, 3:55 PM

## 2018-01-15 ENCOUNTER — Encounter (HOSPITAL_COMMUNITY): Payer: Self-pay

## 2018-01-15 MED ORDER — NICOTINE POLACRILEX 2 MG MT GUM
2.0000 mg | CHEWING_GUM | OROMUCOSAL | Status: DC | PRN
  Filled 2018-01-15: qty 1

## 2018-01-15 NOTE — Progress Notes (Signed)
PROGRESS NOTE    Kelly [name]  TGG:[id number] DOB: [birth year] DOA: 12/02/2017 PCP: Patient, No Pcp Per    Brief Narrative: Kelly [name] is a 32 y.o. 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 Patient was found to have epidural abscess. Patient  underwent  laminectomy and drainage on 7-25. Patient has remained on IV antibiotics, Cefazolin. She needs 8 weeks of IV antibiotics. She will need to be discharge on Oral Bactrim DS BID for at least a month. She will need repeat ECHO 4 weeks after she has completed antibiotics. She will need repeated Blood culture close to finishing antibiotics. She will need to follow up with cardiology and ID after discharge. Last dose of IV antibiotic 9-18. She developed numbness on left calf, MRI lumbar spine was repeated which was reviewed by Neurosurgery, notice improvement of finding.    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 - (HIV, Hepatitis C, AB, RPR negative ) -Needs 8 weeks of IV cefazolin--end date 9-18. -needs weekly CMP and CBC.  -She will need to follow up with cardiology for repeat ECHO out patient.  -Discussed with ID, Dr Baxter Flattery, patient will need  repeat Blood culture near to completing antibiotics. Patient will need to be discharge on Bactrim DS BID for at least a month.  ESR down to 65 CRP 0.8 -blood culture 8-28 No growth   Lumbar epidural abscess, S/P laminectomy and drainage on 7-25.  -MRI; on 8/14 showed persistent discitis possible epidural soft thickening, neurosurgery at that time recommended no further intervention.  -Urine retention resolved.  -Some chronic left LE weakness.  -Report numbness LE callf, some weakness.  -MRI 8-30; Status post L3-4 through L5-S1 posterior decompression without residual or recurrent epidural abscess. Small residual L5-S1 disc protrusion versus abscess improved from prior examination with decrease, mild potential L5-S1 discitis osteomyelitis. Persistent though improved abnormal enhancement of the cauda equina and intrathecal enhancement, possibly infectious. No canal stenosis. Persistent neural foraminal narrowing L4-5 and L5-S1: Moderate to severe on the LEFT at L5-S1. MRI findings were discussed with neurosurgery.  Findings better compared to previous MRI.  No change in treatment.  No need for new intervention.  Continue with physical therapy.  Severe tricuspid regurgitation;  Cardiology recommends completing IV antibiotics and then repeating echo as outpatient to determine further need for intervention.  Patient was under the impression that this would be done prior to discharge.  She was told that it will be done in the outpatient setting.  She will be need to be seen in the cardiology office for the same.  Adrenal insufficiency;  A.m. cortisol 3.4.  ACTH within normal range inappropriately.  Concern for secondary adrenal insufficiency.  Previous physician discussed with endocrinology (Dr.  Buddy Duty) on 8/19 who recommended steroids and then follow-up as outpatient.  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 prednisone 5 mg daily.  Patient will need outpatient follow-up with Dr.  Buddy Duty.  Retained needle fragment.  X-ray confirms 8 mm needle fragment anterior to proximal humerus (prior surgery 05/2016, for retained needle with associated infection).   No signs or symptoms of acute infection at site.    This was discussed with with Silvestre Gunner (on 8/23) who agrees given asymptomatic, location(not in joint) no need for surgical intervention  Polysubstance abuse;  Patient will follow put patient rehab.   Urinary retention and stool incontinence This has resolved.  Briefly required Foley for urinary retention which is now resolved.    Flomax was discontinued.  Constipation; bowel regimen.  Anemia; hb stable.   Normocytic Anemia. Low folate. Hb improved to 9.  Folate being repleted.  Left LE numbness Likely secondary to her spinal disease.  Previous rounding MD has ordered Doppler studies which is pending.  DVT prophylaxis:  Code Status: full code.  Family Communication: Discussed with the patient Disposition Plan: Patient will remain in the hospital until completion off IV antibiotics which will be September 18.   Consultants:   ID   Cardiology.   Neurosurgery  Phone discussion with Dr. Buddy Duty with endocrinology  Procedures:   12/05/17: L3, L4, L5 and S1 laminectomy for drainage of lumbar epidural abscess   TTE: 12/03/17 Study Conclusions; Vegetation noted on the septal leaflet of the tricuspid valve consistent with endocarditis. Cannot exlude vegetation on the anterior leaflet of the mitral valve. Suggest TEE to better assess.  Antimicrobials: Cefazolin 7-26  Subjective Patient feels well.  Denies any new complaints.  Overall she states that she is feeling much better from how she was about 2 weeks ago.  Able to ambulate much more easily than before.  Objective: Vitals:   01/14/18 0456 01/14/18 1409 01/14/18 2132 01/15/18 0518  BP: 101/65 125/75 126/78 114/78  Pulse: 78 (!) 110 84 81  Resp: _0 Temp: 97.8 F (36.6 C) 98.2 F (36.8  C) 98.2 F (36.8 C) 97.8 F (36.6 C)  TempSrc: Oral Oral Oral Oral  SpO2: 98% 98% 96% 99%  Weight:      Height:       No intake or output data in the 24 hours ending 01/15/18 1050 Filed Weights   01/09/18 0434 01/11/18 0500 01/12/18 0550  Weight: 81.7 kg 82.1 kg 83 kg    Examination:  General exam: Alert.  In no distress Respiratory system: Normal effort.  Clear to auscultation bilaterally Cardiovascular system: S1-S2 is normal regular.  No S3-S4.  No rubs murmurs or bruit Gastrointestinal system: Abdomen is soft.  Nontender nondistended.  No masses organomegaly. Central nervous system: Awake alert.  Oriented x3.  4 out of 5 strength in the left lower extremity.    Data Reviewed: I have personally reviewed following labs and imaging studies  CBC: Recent Labs  Lab 01/11/18 0528 01/14/18 0449  WBC 6.1 6.3  HGB 9.3* 9.1*  HCT 30.7* 30.5*  MCV 92.5 93.6  PLT 363 384   Basic Metabolic Panel: Recent Labs  Lab 01/10/18 1636 01/11/18 0528 01/14/18 0449  NA 136 137 136  K 4.8 4.0 4.1  CL 99 99 98  CO2 _1 GLUCOSE 110* 103* 89  BUN _2 CREATININE 0.63 0.63 0.59  CALCIUM 9.5 9.5 9.2   GFR: Estimated Creatinine Clearance: 110.8  mL/min (by C-G formula based on SCr of 0.59 mg/dL).   Recent Results (from the past 240 hour(s))  Culture, blood (routine x 2)     Status: None   Collection Time: 01/09/18 12:02 AM  Result Value Ref Range Status   Specimen Description BLOOD LEFT HAND  Final   Special Requests   Final    BOTTLES DRAWN AEROBIC ONLY Blood Culture results may not be optimal due to an inadequate volume of blood received in culture bottles   Culture   Final    NO GROWTH 5 DAYS Performed at [facility], [place] [address] [place], [place] [address]    Report Status 01/14/2018 FINAL  Final  Culture, blood (routine x 2)     Status: None   Collection Time: 01/09/18 12:08 AM  Result Value Ref Range Status   Specimen Description BLOOD BLOOD LEFT  FOREARM  Final   Special Requests   Final    BOTTLES DRAWN AEROBIC ONLY Blood Culture results may not be optimal due to an inadequate volume of blood received in culture bottles   Culture   Final    NO GROWTH 5 DAYS Performed at [facility], [place] [address], [place], [place] [address]    Report Status 01/14/2018 FINAL  Final         Radiology Studies: No results found.      Scheduled Meds: . acetaminophen  1,000 mg Oral Q8H  . bupivacaine liposome  20 mL Infiltration Once  . bupivacaine-EPINEPHrine  50 mL Infiltration Once  . cyclobenzaprine  10 mg Oral TID  . docusate sodium  100 mg Oral BID  . ferrous sulfate  325 mg Oral Q breakfast  . folic acid  1 mg Oral Daily  . heparin  5,000 Units Subcutaneous Q8H  . lactobacillus  1 g Oral TID WC  . magnesium oxide  400 mg Oral BID  . methadone  60 mg Oral Daily  . nicotine  21 mg Transdermal Daily  . pantoprazole  40 mg Oral Daily  . polyethylene glycol  17 g Oral BID  . predniSONE  5 mg Oral Q breakfast  . senna-docusate  1 tablet Oral BID  . sodium chloride flush  10-40 mL Intracatheter Q12H  . tamsulosin  0.4 mg Oral Daily   Continuous Infusions: .  ceFAZolin (ANCEF) IV 2 g (01/15/18 3794)  . lactated ringers 10 mL/hr at 01/15/18 0313  . methocarbamol (ROBAXIN) IV 100 mL/hr at 01/07/18 1947     LOS: 44 days    Time spent: 35 minutes.     Bonnielee Haff, MD [facility] Pager [phone]  If 7PM-7AM, please contact night-coverage [email or web address] Password [email or web address] 01/15/2018, 10:50 AM

## 2018-01-16 ENCOUNTER — Inpatient Hospital Stay (HOSPITAL_COMMUNITY): Payer: Medicaid Other

## 2018-01-16 DIAGNOSIS — R200 Anesthesia of skin: Secondary | ICD-10-CM

## 2018-01-16 NOTE — Progress Notes (Signed)
Physical Therapy Treatment Patient Details Name: Kelly [name] MRN: [id number] DOB: 04-Oct-1985 Today's Date: 01/16/2018    History of Present Illness Pt is a 32 y.o. female with sepsis, tachycardia, tachypnea, leukocytosis, hypotension.  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 PMH of polysubstance abuse, ADD, scoliosis sp back surgery (20 years ago per mom). PICC placed 12/08/17.   PT Comments    Pt progressing well with mobility. Today's session focused on gait training with RW and stair training with rail support; pt able to complete at supervision-level. Pt in good spirits regarding recovery. Will plan for continued gait training without RW next session.   Follow Up Recommendations  Outpatient PT;Supervision for mobility/OOB     Equipment Recommendations  Rolling walker with 5" wheels;3in1 (PT)    Recommendations for Other Services       Precautions / Restrictions Precautions Precautions: Back;Fall Restrictions Weight Bearing Restrictions: No    Mobility  Bed Mobility Overal bed mobility: Needs Assistance Bed Mobility: Sidelying to Sit;Rolling Rolling: Modified independent (Device/Increase time) Sidelying to sit: Modified independent (Device/Increase time)       General bed mobility comments: Seated EOB  Transfers Overall transfer level: Modified independent Equipment used: Rolling walker (2 wheeled) Transfers: Sit to/from Stand Sit to Stand: Supervision         General transfer comment: for safety  Ambulation/Gait Ambulation/Gait assistance: Supervision Gait Distance (Feet): 1000 Feet Assistive device: Rolling walker (2 wheeled) Gait Pattern/deviations: Step-through pattern;Trendelenburg Gait velocity: Decreased       Stairs Stairs: Yes Stairs assistance: Supervision Stair Management: One rail Left;Step to pattern;Forwards;Sideways Number of Stairs: 14 General  stair comments: Ascended/descended 14 steps total (limited to 4-5 steps at a time due to IV). Pt able to perform with BUE support on single rail, progressing to single UE support on L-side rail. Supervision for safety   Wheelchair Mobility    Modified Rankin (Stroke Patients Only)       Balance Overall balance assessment: Needs assistance Sitting-balance support: No upper extremity supported;Feet supported Sitting balance-Leahy Scale: Good Sitting balance - Comments: able to sit EOB independently w/o support with dynamic activity   Standing balance support: No upper extremity supported;During functional activity Standing balance-Leahy Scale: Fair Standing balance comment: limited dynamic balance outside base of support                             Cognition Arousal/Alertness: Awake/alert Behavior During Therapy: WFL for tasks assessed/performed Overall Cognitive Status: Within Functional Limits for tasks assessed                                        Exercises      General Comments        Pertinent Vitals/Pain Pain Assessment: Faces Faces Pain Scale: Hurts a little bit Pain Location: Lower back Pain Descriptors / Indicators: Sore Pain Intervention(s): Monitored during session    Home Living                      Prior Function            PT Goals (current goals can now be found in the care plan section) Acute Rehab PT Goals Patient Stated Goal: go through intensive therapy before going home.  PT Goal Formulation: With patient  Time For Goal Achievement: 01/31/18 Potential to Achieve Goals: Good Progress towards PT goals: Progressing toward goals    Frequency    Min 3X/week      PT Plan Current plan remains appropriate    Co-evaluation              AM-PAC PT "6 Clicks" Daily Activity  Outcome Measure  Difficulty turning over in bed (including adjusting bedclothes, sheets and blankets)?: None Difficulty  moving from lying on back to sitting on the side of the bed? : None Difficulty sitting down on and standing up from a chair with arms (e.g., wheelchair, bedside commode, etc,.)?: A Little Help needed moving to and from a bed to chair (including a wheelchair)?: A Little Help needed walking in hospital room?: A Little Help needed climbing 3-5 steps with a railing? : A Little 6 Click Score: 20    End of Session Equipment Utilized During Treatment: Gait belt Activity Tolerance: Patient tolerated treatment well Patient left: in bed;with call bell/phone within reach;Other (comment)(seated EOB) Nurse Communication: Mobility status PT Visit Diagnosis: Other abnormalities of gait and mobility (R26.89);Pain Pain - Right/Left: Left Pain - part of body: Hip     Time: 8138-8719 PT Time Calculation (min) (ACUTE ONLY): 29 min  Charges:  $Gait Training: 23-37 mins                     Mabeline Caras, PT, DPT Acute Rehabilitation Services  Pager [phone] [place] 01/16/2018, 5:37 PM

## 2018-01-16 NOTE — Clinical Social Work Note (Signed)
Clinical Social Work Assessment  Patient Details  Name: Kelly [name] MRN: [id number] Date of Birth: Oct 11, 1985  Date of referral:                  Reason for consult:  Emotional/Coping/Adjustment to Illness, Discharge Planning, Community Resources, Substance Use/ETOH Abuse                Permission sought to share information with:  Family Supports Permission granted to share information::  Yes, Verbal Permission Granted  Name::     Bedie Dominey  Agency::     Relationship::  mom  Contact Information:  [phone]  Housing/Transportation Living arrangements for the past 2 months:  Apartment Source of Information:  Patient Patient Interpreter Needed:  None Criminal Activity/Legal Involvement Pertinent to Current Situation/Hospitalization:  No - Comment as needed Significant Relationships:  Friend, Other Family Members, Dependent Children, Parents Lives with:  Self Do you feel safe going back to the place where you live?  Yes Need for family participation in patient care:  Yes (Comment)  Care giving concerns: Pt has hx of ivdu, has 22 year old son, supportive family. Pt has been finishing iv abx with planned end date on 9/18. Pt has questions regarding outpatient care and f/u therapeutic support.    Social Worker assessment / plan:  CSW met with pt at bedside, pt alert and interested in speaking with CSW. Pt is very forthcoming with her story- has hx of ivdu, has been on opiates since she was a teenager and recognizes that she has a pattern of substance abuse but is very eager to maintain sobriety. Pt currently on methadone and will continue going to [facility] where she was previously getting her regular does. Pt cites her methadone as important part of sobriety but also wants to attend counseling in conjunction. Pt has a mother who is involved in care as well as a 65 year old son. CSW and pt spoke about impact of substance use on son, pt states that her son is currently  receiving support from a counselor as well which she knows is important. Pt receptive towards CSW resources. Pt given information regarding intensive inpatient and outpatient counseling resources which indicate which programs will accept pt's Medicaid. Pt also given information regarding [facility] [place] and [place]. Pt encouraged to share resources with any friends, especially [facility] for any friends who may be interested in harm reduction syringe program as well as narcan. Pt very interested in sharing with a friend who she has known since childhood.  Pt hoping also for CSW to serve as advocate to medical staff to see if cardiology will be able to come and meet with pt and pt mother prior to discharge. Pt also hoping for off the floor privileges with therapy staff since she has not been outside since admission on 7/22. These requests were communicated with attending MD.   Employment status:  Unemployed Insurance information:  Medicaid [place] PT Recommendations:  Outpatient Therapies Information / Referral to community resources:  Residential Substance Abuse Treatment Options, Outpatient Psychiatric Care (Comment Required), [facility] [place], Other (Comment Required)([facility])  Patient/Family's Response to care:  Pt very interested in CSW support and resources, accepting of all resources and recommendations.   Patient/Family's Understanding of and Emotional Response to Diagnosis, Current Treatment, and Prognosis:  Pt states understanding of diagnosis, current treatment and prognosis. Pt emotionally appropriate and shares her story readily and appropriately with CSW. Pt hopeful for continued improvement in  health and recognizes the impact of substance use on her health and the health of her relationships. Pt conveys good intentions for continued maintenance of sobriety and hope for return back to school and a positive relationship with her son as she gets healthier. Pt aware CSW  remains available as advocate and resource for pt until discharge.   Emotional Assessment Appearance:  Appears stated age Attitude/Demeanor/Rapport:  Engaged, Ambitious, Gracious Affect (typically observed):  Adaptable, Appropriate, Accepting, Pleasant Orientation:  Oriented to Self, Oriented to Place, Oriented to  Time, Oriented to Situation Alcohol / Substance use:  Illicit Drugs Psych involvement (Current and /or in the community):  Outpatient Provider  Discharge Needs  Concerns to be addressed:  Care Coordination, Substance Abuse Concerns Readmission within the last 30 days:  Yes Current discharge risk:  Substance Abuse Barriers to Discharge:  Continued Medical Work up   [employer], [place] 01/16/2018, 3:28 PM

## 2018-01-16 NOTE — Progress Notes (Signed)
VASCULAR LAB PRELIMINARY  PRELIMINARY  PRELIMINARY  PRELIMINARY  Left lower extremity venous duplex completed.    Preliminary report:  There is no DVT or SVT noted in the left lower extremity.  Vandora Jaskulski, RVT 01/16/2018, 9:19 AM

## 2018-01-16 NOTE — Progress Notes (Signed)
Occupational Therapy Treatment Patient Details Name: Kelly [name] MRN: [id number] DOB: [birth year] Today's Date: 01/16/2018    History of present illness Kelly [name] is a 32 y.o. F with sepsis, tachycardia, tachypnea, leukocytosis, hypotension.  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 PMH of polysubstance abuse, ADD, scoliosis sp back surgery (20 years ago per mom). PICC placed 12/08/17.   OT comments  Patient progressing well towards OT goals.  Patient completing toilet transfers with supervision using RW, engaged in IADL tasks to challenge standing balance and tolerance with 0 hand support (folding linen and transferring toiletry items to different shelves) with supervision and no losses of balance (but noted unsteadiness and fatigue with tasks).  Reviewed safety and back precautions, encouraging patient to use reacher when retrieving items from floor surface at this time.  Will continue to follow and progress towards goals.    Follow Up Recommendations  Supervision/Assistance - 24 hour;Outpatient OT    Equipment Recommendations  3 in 1 bedside commode;Wheelchair (measurements OT);Wheelchair cushion (measurements OT)    Recommendations for Other Services      Precautions / Restrictions Precautions Precautions: Back;Fall Restrictions Weight Bearing Restrictions: No       Mobility Bed Mobility Overal bed mobility: Needs Assistance Bed Mobility: Sidelying to Sit;Rolling Rolling: Modified independent (Device/Increase time) Sidelying to sit: Modified independent (Device/Increase time)       General bed mobility comments: no assistance required  Transfers Overall transfer level: Needs assistance Equipment used: Rolling walker (2 wheeled);None Transfers: Sit to/from Stand Sit to Stand: Supervision         General transfer comment: for safety    Balance Overall balance assessment:  Needs assistance Sitting-balance support: No upper extremity supported;Feet supported Sitting balance-Leahy Scale: Good Sitting balance - Comments: able to sit EOB independently w/o support with dynamic activity   Standing balance support: No upper extremity supported;During functional activity Standing balance-Leahy Scale: Fair Standing balance comment: limited dynamic balance outside base of support                            ADL either performed or assessed with clinical judgement   ADL Overall ADL's : Needs assistance/impaired     Grooming: Supervision/safety;Standing               Lower Body Dressing: Sit to/from stand;Supervision/safety Lower Body Dressing Details (indicate cue type and reason): supervision for donning shoes  Toilet Transfer: Supervision/safety;RW(simulated in room) Toilet Transfer Details (indicate cue type and reason): supervision using RW          Functional mobility during ADLs: Supervision/safety;Rolling walker General ADL Comments: Simulated home management IADL tasks: organizing toiletries standing at sink (transferring between surfaces) and folding linen with 0 hand support, close supervision but no losses of balance. Reviewed safety and using reacher to retrieve items from floor.      Vision       Perception     Praxis      Cognition Arousal/Alertness: Awake/alert Behavior During Therapy: WFL for tasks assessed/performed Overall Cognitive Status: Within Functional Limits for tasks assessed                                          Exercises     Shoulder Instructions       General Comments  Pertinent Vitals/ Pain       Pain Assessment: Faces Faces Pain Scale: Hurts a little bit Pain Location: Lower back Pain Descriptors / Indicators: Grimacing;Guarding Pain Intervention(s): Monitored during session  Home Living                                          Prior  Functioning/Environment              Frequency  Min 2X/week        Progress Toward Goals  OT Goals(current goals can now be found in the care plan section)  Progress towards OT goals: Progressing toward goals  Acute Rehab OT Goals Patient Stated Goal: go through intensive therapy before going home.  OT Goal Formulation: With patient Time For Goal Achievement: 01/21/18 Potential to Achieve Goals: Good  Plan Frequency remains appropriate;Discharge plan remains appropriate    Co-evaluation                 AM-PAC PT "6 Clicks" Daily Activity     Outcome Measure   Help from another person eating meals?: None Help from another person taking care of personal grooming?: None Help from another person toileting, which includes using toliet, bedpan, or urinal?: None Help from another person bathing (including washing, rinsing, drying)?: A Little Help from another person to put on and taking off regular upper body clothing?: None Help from another person to put on and taking off regular lower body clothing?: A Little 6 Click Score: 22    End of Session Equipment Utilized During Treatment: Gait belt;Rolling walker  OT Visit Diagnosis: Unsteadiness on feet (R26.81);Other abnormalities of gait and mobility (R26.89);Pain Pain - part of body: (lower back)   Activity Tolerance Patient tolerated treatment well   Patient Left with call bell/phone within reach;Other (comment)(seated EOB)   Nurse Communication          Time: 1225-8346 OT Time Calculation (min): 23 min  Charges: OT General Charges $OT Visit: 1 Visit OT Treatments $Self Care/Home Management : 23-37 mins  Kelly [name], Kelly [name] Pager [phone] Office [phone]    Kelly [name] 01/16/2018, 5:06 PM

## 2018-01-16 NOTE — Progress Notes (Signed)
PROGRESS NOTE    Kelly [name]  GXQ:[id number] DOB: 1985-06-27 DOA: 12/02/2017 PCP: Patient, No Pcp Per    Brief Narrative: Kelly [name] is a 32 y.o. 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 Patient was found to have epidural abscess. Patient  underwent  laminectomy and drainage on 7-25. Patient has remained on IV antibiotics, Cefazolin. She needs 8 weeks of IV antibiotics. She will need to be discharge on Oral Bactrim DS BID for at least a month. She will need repeat ECHO 4 weeks after she has completed antibiotics. She will need repeated Blood culture close to finishing antibiotics. She will need to follow up with cardiology and ID after discharge. Last dose of IV antibiotic 9-18. She developed numbness on left calf, MRI lumbar spine was repeated which was reviewed by Neurosurgery, notice improvement of finding.    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 - (HIV, Hepatitis C, AB, RPR negative ) -Needs 8 weeks of IV cefazolin--end date 9-18. -needs weekly CMP and CBC.  -She will need to follow up with cardiology.  Patient will need repeat echocardiogram.  Initially plan was for this to be  done on September 18 at cardiology office.  However since she will likely be discharged on September 19 I have requested cardiology to reschedule the appointment and we will consider doing an echocardiogram prior to discharge. -Discussed with ID, Dr Baxter Flattery, patient will need repeat Blood culture near to completing antibiotics. Patient will need to be discharge on Bactrim DS BID for at least a month.  ESR down to 65 CRP 0.8 -blood culture 8-28 No growth   Lumbar epidural abscess, S/P laminectomy and drainage on 7-25.  -MRI; on 8/14 showed persistent discitis possible epidural soft thickening, neurosurgery at that time recommended no further intervention.  -Urine retention resolved.  -Some chronic left LE weakness.  -Report numbness LE callf, some weakness.  -MRI 8-30; Status post L3-4 through L5-S1 posterior decompression without residual or recurrent epidural abscess. Small residual L5-S1 disc protrusion versus abscess improved from prior examination with decrease, mild potential L5-S1 discitis osteomyelitis. Persistent though improved abnormal enhancement of the cauda equina and intrathecal enhancement, possibly infectious. No canal stenosis. Persistent neural foraminal narrowing L4-5 and L5-S1: Moderate to severe on the LEFT at L5-S1. MRI findings were discussed with neurosurgery.  Findings better compared to previous MRI.  No change in treatment.  No need for new intervention.  Continue with physical therapy.  Severe tricuspid regurgitation;  Cardiology recommends completing IV antibiotics and then repeating echo as outpatient to determine further need for intervention.  Patient will need repeat echocardiogram.  Initially plan was for this to be done on September 18 at cardiology office.  However since  she will likely be discharged on September 19 I have requested cardiology to reschedule the appointment and we will consider doing an echocardiogram prior to discharge.  Adrenal insufficiency;    A.m. cortisol 3.4.  ACTH within normal range inappropriately.  Concern for secondary adrenal insufficiency.  Previous physician discussed with endocrinology (Dr. Buddy Duty) on 8/19 who recommended steroids and then follow-up as outpatient.  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 prednisone 5 mg daily.  Patient will need outpatient follow-up with Dr. Buddy Duty.  Retained needle fragment.  X-ray confirms 8 mm needle fragment anterior to proximal humerus (prior surgery 05/2016, for retained needle with associated infection).   No signs or symptoms of acute infection at site.    This was discussed with with Silvestre Gunner (on 8/23) who agrees given asymptomatic, location(not in joint) no need for surgical intervention  Polysubstance abuse Patient will follow put patient rehab.   Urinary retention and stool incontinence This has resolved.  Briefly required Foley for urinary retention which is now resolved.    Flomax was discontinued.  Constipation Bowel regimen.   Anemia Hemoglobin remains stable.     Normocytic Anemia. Low folate. Hb improved to 9.  Folate being repleted.  Left LE numbness Likely secondary to her spinal disease.  Stable to improving.  Continue to monitor for now.  No DVT noted on Doppler study.    DVT prophylaxis:  Code Status: full code.  Family Communication: Discussed with the patient Disposition Plan: Patient will remain in the hospital until completion off IV antibiotics which will be September 18.   Consultants:   ID   Cardiology.   Neurosurgery  Phone discussion with Dr. Buddy Duty with endocrinology  Procedures:   12/05/17: L3, L4, L5 and S1 laminectomy for drainage of lumbar epidural abscess   TTE: 12/03/17 Study Conclusions; Vegetation noted on the septal leaflet of the tricuspid valve consistent with endocarditis. Cannot exlude vegetation on the anterior leaflet of the mitral valve. Suggest TEE to better assess.  Antimicrobials: Cefazolin  7-26  Subjective Patient feels well.  States that her mother is requesting to speak with cardiology.  Does not know what she wants to ask the cardiologist.  I have asked the patient to find this out and perhaps I can answer the questions.  I told the patient that there is no clear benefit of having cardiology see the patient again in the hospital as the patient remains stable and without any active cardiac issues.  She will be made an appointment as an outpatient soon after discharge.    Objective: Vitals:   01/15/18 0518 01/15/18 1520 01/15/18 2114 01/16/18 0556  BP: 114/78 102/62 116/78 126/81  Pulse: 81 67 71 87  Resp: 16 20 16    Temp: 97.8 F (36.6 C) 97.6 F (36.4 C) 98.1 F (36.7 C) 97.7 F (36.5 C)  TempSrc: Oral Oral Oral Oral  SpO2: 99% 99% 100% 99%  Weight:    84 kg  Height:        Intake/Output Summary (Last 24 hours) at 01/16/2018 1052 Last data filed at 01/16/2018 0100 Gross per 24 hour  Intake 240 ml  Output -  Net 240 ml   Filed Weights   01/11/18 0500 01/12/18 0550 01/16/18 0556  Weight: 82.1 kg 83 kg 84 kg    Examination:  General exam: Awake alert.  In no distress. Respiratory system: Normal effort.  Clear to auscultation bilaterally. Cardiovascular system: S1-S2 is normal regular.  No S3-S4.  No rubs murmurs  or bruit. Gastrointestinal system: Abdomen is soft.  Nontender nondistended.  No masses organomegaly.  Bowel sounds are present normal. Central nervous system: Alert and oriented x3.  No new deficits.  Mild weakness in the left leg as before.    Data Reviewed: I have personally reviewed following labs and imaging studies  CBC: Recent Labs  Lab 01/11/18 0528 01/14/18 0449  WBC 6.1 6.3  HGB 9.3* 9.1*  HCT 30.7* 30.5*  MCV 92.5 93.6  PLT 363 364   Basic Metabolic Panel: Recent Labs  Lab 01/10/18 1636 01/11/18 0528 01/14/18 0449  NA 136 137 136  K 4.8 4.0 4.1  CL 99 99 98  CO2 26 31 28   GLUCOSE 110* 103* 89  BUN 19 19 16   CREATININE  0.63 0.63 0.59  CALCIUM 9.5 9.5 9.2   GFR: Estimated Creatinine Clearance: 111.5 mL/min (by C-G formula based on SCr of 0.59 mg/dL).   Recent Results (from the past 240 hour(s))  Culture, blood (routine x 2)     Status: None   Collection Time: 01/09/18 12:02 AM  Result Value Ref Range Status   Specimen Description BLOOD LEFT HAND  Final   Special Requests   Final    BOTTLES DRAWN AEROBIC ONLY Blood Culture results may not be optimal due to an inadequate volume of blood received in culture bottles   Culture   Final    NO GROWTH 5 DAYS Performed at [facility], [place] [address], [place], [place] [address]    Report Status 01/14/2018 FINAL  Final  Culture, blood (routine x 2)     Status: None   Collection Time: 01/09/18 12:08 AM  Result Value Ref Range Status   Specimen Description BLOOD BLOOD LEFT FOREARM  Final   Special Requests   Final    BOTTLES DRAWN AEROBIC ONLY Blood Culture results may not be optimal due to an inadequate volume of blood received in culture bottles   Culture   Final    NO GROWTH 5 DAYS Performed at [facility], [place] [address], [place], [place] [address]    Report Status 01/14/2018 FINAL  Final         Radiology Studies: No results found.      Scheduled Meds: . acetaminophen  1,000 mg Oral Q8H  . bupivacaine liposome  20 mL Infiltration Once  . bupivacaine-EPINEPHrine  50 mL Infiltration Once  . cyclobenzaprine  10 mg Oral TID  . docusate sodium  100 mg Oral BID  . ferrous sulfate  325 mg Oral Q breakfast  . folic acid  1 mg Oral Daily  . heparin  5,000 Units Subcutaneous Q8H  . lactobacillus  1 g Oral TID WC  . magnesium oxide  400 mg Oral BID  . methadone  60 mg Oral Daily  . pantoprazole  40 mg Oral Daily  . polyethylene glycol  17 g Oral BID  . predniSONE  5 mg Oral Q breakfast  . senna-docusate  1 tablet Oral BID  . sodium chloride flush  10-40 mL Intracatheter Q12H  . tamsulosin  0.4 mg Oral Daily   Continuous  Infusions: .  ceFAZolin (ANCEF) IV 2 g (01/16/18 0553)  . lactated ringers 10 mL/hr at 01/15/18 2237  . methocarbamol (ROBAXIN) IV 100 mL/hr at 01/07/18 1947     LOS: 45 days    Time spent: 35 minutes.     Bonnielee Haff, MD [facility] Pager [phone]  If 7PM-7AM, please contact night-coverage [email or web address]  Password [email or web address] 01/16/2018, 10:52 AM

## 2018-01-17 DIAGNOSIS — M4646 Discitis, unspecified, lumbar region: Secondary | ICD-10-CM

## 2018-01-17 DIAGNOSIS — F1190 Opioid use, unspecified, uncomplicated: Secondary | ICD-10-CM

## 2018-01-17 DIAGNOSIS — M4626 Osteomyelitis of vertebra, lumbar region: Secondary | ICD-10-CM

## 2018-01-17 NOTE — Progress Notes (Signed)
Physical Therapy Treatment Patient Details Name: Kelly [name] MRN: [id number] DOB: November 02, 1985 Today's Date: 01/17/2018    History of Present Illness Pt is a 32 y.o. female with sepsis, tachycardia, tachypnea, leukocytosis, hypotension.  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 PMH of polysubstance abuse, ADD, scoliosis sp back surgery (20 years ago per mom). PICC placed 12/08/17.    PT Comments    Patient seen for mobility progression. This session focused on balance and gait training on dynamic surfaces with RW and with single UE support. Pt tolerated session well and excited to go outside today. Continue to progress as tolerated.   Follow Up Recommendations  Outpatient PT;Supervision for mobility/OOB     Equipment Recommendations  Rolling walker with 5" wheels;3in1 (PT)    Recommendations for Other Services Rehab consult     Precautions / Restrictions Precautions Precautions: Back;Fall    Mobility  Bed Mobility               General bed mobility comments: Seated EOB  Transfers Overall transfer level: Modified independent Equipment used: Rolling walker (2 wheeled)                Ambulation/Gait Ambulation/Gait assistance: Supervision;Min guard Gait Distance (Feet): 1000 Feet Assistive device: Rolling walker (2 wheeled);IV Pole Gait Pattern/deviations: Step-through pattern;Trendelenburg Gait velocity: Decreased   General Gait Details: 2 seated rest breaks taken; gait training with RW and without using single UE support on IV pole on flat and dynamic surfaces; min guard (min A at times) required without use of RW and on more challenging surfaces outside    Stairs             Wheelchair Mobility    Modified Rankin (Stroke Patients Only)       Balance Overall balance assessment: Needs assistance Sitting-balance support: No upper extremity supported;Feet  supported Sitting balance-Leahy Scale: Good     Standing balance support: No upper extremity supported;During functional activity Standing balance-Leahy Scale: Fair Standing balance comment: limited dynamic balance outside base of support                             Cognition Arousal/Alertness: Awake/alert Behavior During Therapy: WFL for tasks assessed/performed Overall Cognitive Status: Within Functional Limits for tasks assessed                                        Exercises      General Comments        Pertinent Vitals/Pain Pain Assessment: No/denies pain    Home Living                      Prior Function            PT Goals (current goals can now be found in the care plan section) Acute Rehab PT Goals PT Goal Formulation: With patient Time For Goal Achievement: 01/31/18 Potential to Achieve Goals: Good Progress towards PT goals: Progressing toward goals    Frequency    Min 3X/week      PT Plan Current plan remains appropriate    Co-evaluation              AM-PAC PT "6 Clicks" Daily Activity  Outcome Measure  Difficulty turning over in bed (including adjusting  bedclothes, sheets and blankets)?: None Difficulty moving from lying on back to sitting on the side of the bed? : None Difficulty sitting down on and standing up from a chair with arms (e.g., wheelchair, bedside commode, etc,.)?: A Little Help needed moving to and from a bed to chair (including a wheelchair)?: A Little Help needed walking in hospital room?: A Little Help needed climbing 3-5 steps with a railing? : A Little 6 Click Score: 20    End of Session Equipment Utilized During Treatment: Gait belt Activity Tolerance: Patient tolerated treatment well Patient left: in bed;with call bell/phone within reach;Other (comment)(seated EOB) Nurse Communication: Mobility status PT Visit Diagnosis: Other abnormalities of gait and mobility  (R26.89);Pain Pain - Right/Left: Left Pain - part of body: Hip     Time: 1610-9604 PT Time Calculation (min) (ACUTE ONLY): 37 min  Charges:  $Gait Training: 23-37 mins                     Earney Navy, PTA Pager: [phone]     Darliss Cheney 01/17/2018, 4:42 PM

## 2018-01-17 NOTE — Progress Notes (Signed)
INFECTIOUS DISEASE PROGRESS NOTE  ID: Kelly [name] is a 32 y.o. 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  Low back pain with PT, ambulation.   Abtx:  Anti-infectives (From admission, onward)   Start     Dose/Rate Route Frequency Ordered Stop   12/06/17 2200  ceFAZolin (ANCEF) IVPB 2g/100 mL premix     2 g 200 mL/hr over 30 Minutes Intravenous Every 8 hours 12/06/17 1016 01/29/18 2359   12/06/17 0600  vancomycin (VANCOCIN) IVPB 1000 mg/200 mL premix  Status:  Discontinued     1,000 mg 200 mL/hr over 60 Minutes Intravenous Every 8 hours 12/05/17 2316 12/06/17 1200   12/06/17 0000  cefTRIAXone (ROCEPHIN) 2 g in sodium chloride 0.9 % 100 mL IVPB  Status:  Discontinued     2 g 200 mL/hr over 30 Minutes Intravenous Every 24 hours 12/05/17 2238 12/06/17 1016   12/05/17 2300  vancomycin (VANCOCIN) 1,750 mg in sodium chloride 0.9 % 500 mL IVPB     1,750 mg 250 mL/hr over 120 Minutes Intravenous  Once 12/05/17 2246 12/06/17 0228   12/05/17 2055  vancomycin (VANCOCIN) powder  Status:  Discontinued       As needed 12/05/17 2055 12/05/17 2118   12/05/17 2001  bacitracin 50,000 Units in sodium chloride 0.9 % 500 mL irrigation  Status:  Discontinued       As needed 12/05/17 2002 12/05/17 2118   12/03/17 1600  azithromycin (ZITHROMAX) 500 mg in sodium chloride 0.9 % 250 mL IVPB  Status:  Discontinued     500 mg 250 mL/hr over 60 Minutes Intravenous Every 24 hours 12/02/17 1706 12/03/17 0948   12/03/17 1400  ceFAZolin (ANCEF) IVPB 2g/100 mL premix   Status:  Discontinued     2 g 200 mL/hr over 30 Minutes Intravenous Every 8 hours 12/03/17 0948 12/05/17 2238   12/03/17 1000  vancomycin (VANCOCIN) IVPB 750 mg/150 ml premix  Status:  Discontinued     750 mg 150 mL/hr over 60 Minutes Intravenous Every 12 hours 12/02/17 1710 12/03/17 0948   12/02/17 2200  piperacillin-tazobactam (ZOSYN) IVPB 3.375 g  Status:  Discontinued     3.375 g 12.5 mL/hr over 240 Minutes Intravenous Every 8 hours 12/02/17 1710 12/03/17 0948   12/02/17 1800  vancomycin (VANCOCIN) 1,500 mg in sodium chloride 0.9 % 500 mL IVPB     1,500 mg 250 mL/hr over 120 Minutes Intravenous  Once 12/02/17 1710 12/02/17 2110   12/02/17 1530  cefTRIAXone (ROCEPHIN) 1 g in sodium chloride 0.9 % 100 mL IVPB     1 g 200 mL/hr over 30 Minutes Intravenous  Once 12/02/17 1522 12/02/17 1626   12/02/17 1530  azithromycin (ZITHROMAX) 500 mg in sodium chloride 0.9 % 250 mL IVPB     500 mg 250 mL/hr over 60 Minutes Intravenous  Once 12/02/17 1522 12/02/17 1759      Medications:  Scheduled: . acetaminophen  1,000 mg Oral Q8H  . bupivacaine liposome  20 mL Infiltration Once  . bupivacaine-EPINEPHrine  50 mL  Infiltration Once  . cyclobenzaprine  10 mg Oral TID  . docusate sodium  100 mg Oral BID  . ferrous sulfate  325 mg Oral Q breakfast  . folic acid  1 mg Oral Daily  . heparin  5,000 Units Subcutaneous Q8H  . lactobacillus  1 g Oral TID WC  . magnesium oxide  400 mg Oral BID  . methadone  60 mg Oral Daily  . pantoprazole  40 mg Oral Daily  . polyethylene glycol  17 g Oral BID  . predniSONE  5 mg Oral Q breakfast  . senna-docusate  1 tablet Oral BID  . sodium chloride flush  10-40 mL Intracatheter Q12H  . tamsulosin  0.4 mg Oral Daily    Objective: Vital signs in last 24 hours: Temp:  [97.5 F (36.4 C)-97.7 F (36.5 C)] 97.7 F (36.5 C) (09/06 0600) Pulse Rate:  [78-90] 78 (09/06 0600) Resp:  [16] 16 (09/05 1515) BP: (104-112)/(68-76) 105/68 (09/06 0600) SpO2:  [97 %-98  %] 98 % (09/06 0600)   General appearance: alert, cooperative and no distress Resp: clear to auscultation bilaterally Cardio: regular rate and rhythm GI: normal findings: bowel sounds normal and soft, non-tender  Lab Results No results for input(s): WBC, HGB, HCT, NA, K, CL, CO2, BUN, CREATININE, GLU in the last 72 hours.  Invalid input(s): PLATELETS Liver Panel No results for input(s): PROT, ALBUMIN, AST, ALT, ALKPHOS, BILITOT, BILIDIR, IBILI in the last 72 hours. Sedimentation Rate No results for input(s): ESRSEDRATE in the last 72 hours. C-Reactive Protein No results for input(s): CRP in the last 72 hours.  Microbiology: Recent Results (from the past 240 hour(s))  Culture, blood (routine x 2)     Status: None   Collection Time: 01/09/18 12:02 AM  Result Value Ref Range Status   Specimen Description BLOOD LEFT HAND  Final   Special Requests   Final    BOTTLES DRAWN AEROBIC ONLY Blood Culture results may not be optimal due to an inadequate volume of blood received in culture bottles   Culture   Final    NO GROWTH 5 DAYS Performed at [facility], [place] [name], [place], [place] [address]    Report Status 01/14/2018 FINAL  Final  Culture, blood (routine x 2)     Status: None   Collection Time: 01/09/18 12:08 AM  Result Value Ref Range Status   Specimen Description BLOOD BLOOD LEFT FOREARM  Final   Special Requests   Final    BOTTLES DRAWN AEROBIC ONLY Blood Culture results may not be optimal due to an inadequate volume of blood received in culture bottles   Culture   Final    NO GROWTH 5 DAYS Performed at [facility], [place] [address], [place], [place] [address]    Report Status 01/14/2018 FINAL  Final    Studies/Results: No results found.   Assessment/Plan: MSSA bacteremia TV and MV IE L spine discitis, osteo Opioid use d/o  Total days of antibiotics: 31 (end date 9-18) ancef   Plan for repeat TTE/TEE at end of therapy Consider repeat MRi of L  spine then as well She is on methadone and is looking into outpt rehab. She will move in with family at d/c.          Bobby Rumpf MD, FACP Infectious Diseases (pager) [phone] [email or web address] 01/17/2018, 11:08 AM  LOS: 46 days

## 2018-01-17 NOTE — Progress Notes (Signed)
PROGRESS NOTE    Kelly [name]  NTI:[id number] DOB: 09/11/1985 DOA: 12/02/2017 PCP: Patient, No Pcp Per    Brief Narrative: Kelly [name] is a 32 y.o. 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 Patient was found to have epidural abscess. Patient  underwent  laminectomy and drainage on 7-25. Patient has remained on IV antibiotics, Cefazolin. She needs 8 weeks of IV antibiotics. She will need to be discharge on Oral Bactrim DS BID for at least a month. She will need repeat ECHO 4 weeks after she has completed antibiotics. She will need repeated Blood culture close to finishing antibiotics. She will need to follow up with cardiology and ID after discharge. Last dose of IV antibiotic 9-18. She developed numbness on left calf, MRI lumbar spine was repeated which was reviewed by Neurosurgery, notice improvement of finding.    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 - (HIV, Hepatitis C, AB, RPR negative ) -Needs 8 weeks of IV cefazolin--end date 9-18. -needs weekly CMP and CBC.  -She will need to follow up with cardiology.  Patient will need repeat echocardiogram.  Initially plan was for this to be done  on September 18 at cardiology office.  However since she will likely be discharged on September 19 I have requested cardiology to reschedule the appointment and we will consider doing an echocardiogram prior to discharge. -Discussed with ID, Dr Baxter Flattery, patient will need repeat Blood culture near to completing antibiotics. Patient will need to be discharge on Bactrim DS BID for at least a month.  ESR down to 65 CRP 0.8 -blood culture 8-28 No growth  Patient remains stable.  Lumbar epidural abscess, S/P laminectomy and drainage on 7-25.  -MRI; on 8/14 showed persistent discitis possible epidural soft thickening, neurosurgery at that time recommended no further intervention.  -Urine retention resolved.  -Some chronic left LE weakness.  -Report numbness LE callf, some weakness.  -MRI 8-30; Status post L3-4 through L5-S1 posterior decompression without residual or recurrent epidural abscess. Small residual L5-S1 disc protrusion versus abscess improved from prior examination with decrease, mild potential L5-S1 discitis osteomyelitis. Persistent though improved abnormal enhancement of the cauda equina and intrathecal enhancement, possibly infectious. No canal stenosis. Persistent neural foraminal narrowing L4-5 and L5-S1: Moderate to severe on the LEFT at L5-S1. MRI findings were discussed with neurosurgery.  Findings better compared to previous MRI.  No change in treatment.  No need for new intervention.  Continue with physical therapy. Will need outpatient follow-up with neurosurgery.  Severe tricuspid regurgitation Cardiology recommends completing IV antibiotics and then repeating echo as outpatient to determine further need for intervention.  Patient will need repeat echocardiogram.  Initially plan was for this to be done on September  18 at cardiology office.  However since she will likely be discharged on September 19 I requested cardiology to reschedule the appointment.  Appointment has been  rescheduled for October 1.  Discussed with patient today.  We will do an echocardiogram prior to discharge.   Adrenal insufficiency;  A.m. cortisol 3.4.    ACTH stimulation test showed suboptimal response.  ACTH within normal range inappropriately.  Concern for secondary adrenal insufficiency.  Previous physician discussed with endocrinology (Dr. Buddy Duty) on 8/19 who recommended steroids and then follow-up as outpatient.  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 prednisone 5 mg daily.  Patient will need outpatient follow-up with Dr. Buddy Duty.  Retained needle fragment.  X-ray confirms 8 mm needle fragment anterior to proximal humerus (prior surgery 05/2016, for retained needle with associated infection).   No signs or symptoms of acute infection at site.    This was discussed with with Silvestre Gunner (on 8/23) who agrees given asymptomatic, location (not in joint) no need for surgical intervention  Polysubstance abuse Patient and her mother have arranged for outpatient follow-up with addiction clinic.  Patient is currently on methadone.  Urinary retention and stool incontinence This has resolved.  Briefly required Foley for urinary retention which is now resolved.    Flomax was discontinued.  Constipation Bowel regimen.   Anemia Hemoglobin remains stable.     Normocytic Anemia. Low folate. Hb improved to 9.  Folate being repleted.  Left LE numbness Likely secondary to her spinal disease.  Stable to improving.  Continue to monitor for now.  No DVT noted on Doppler study.    DVT prophylaxis:  Code Status: full code.  Family Communication: Discussed with the patient Disposition Plan: Patient will remain in the hospital until completion off IV antibiotics which will be September 18.   Consultants:   ID   Cardiology.   Neurosurgery  Phone discussion with Dr. Buddy Duty with endocrinology  Procedures:   12/05/17: L3, L4, L5 and S1 laminectomy for drainage of lumbar epidural  abscess   TTE: 12/03/17 Study Conclusions; Vegetation noted on the septal leaflet of the tricuspid valve consistent with endocarditis. Cannot exlude vegetation on the anterior leaflet of the mitral valve. Suggest TEE to better assess.  Antimicrobials: Cefazolin 7-26  Subjective Patient feels well.  Her mother was at the bedside.  Long discussion with patient and her mother regarding further plans.  She was reassured.  Patient has follow-up appointment with ID and cardiology.  She will need to see neurosurgery and endocrinology.  Echocardiogram will be done prior to discharge.     Objective: Vitals:   01/16/18 0556 01/16/18 1515 01/16/18 2259 01/17/18 0600  BP: 126/81 112/76 104/68 105/68  Pulse: 87 90 89 78  Resp:  16    Temp: 97.7 F (36.5 C) (!) 97.5 F (36.4 C) 97.7 F (36.5 C) 97.7 F (36.5 C)  TempSrc: Oral Oral Oral Oral  SpO2: 99% 97% 98% 98%  Weight: 84 kg     Height:        Intake/Output Summary (Last 24 hours) at 01/17/2018 1054 Last data filed at 01/16/2018 1847 Gross per 24 hour  Intake 600 ml  Output -  Net 600 ml   Filed Weights   01/11/18 0500 01/12/18 0550 01/16/18 0556  Weight: 82.1 kg 83 kg 84 kg    Examination:  General exam: Awake alert.  In no distress Respiratory system: Normal effort.  Clear to auscultation bilaterally Cardiovascular system: S1-S2 normal regular. Gastrointestinal system: Abdomen  soft.  Nontender nondistended  Central nervous system: Alert and oriented x3.  No focal neurological deficits.    Data Reviewed: I have personally reviewed following labs and imaging studies  CBC: Recent Labs  Lab 01/11/18 0528 01/14/18 0449  WBC 6.1 6.3  HGB 9.3* 9.1*  HCT 30.7* 30.5*  MCV 92.5 93.6  PLT 363 500   Basic Metabolic Panel: Recent Labs  Lab 01/10/18 1636 01/11/18 0528 01/14/18 0449  NA 136 137 136  K 4.8 4.0 4.1  CL 99 99 98  CO2 _0 GLUCOSE 110* 103* 89  BUN _1 CREATININE 0.63 0.63 0.59  CALCIUM 9.5 9.5  9.2   GFR: Estimated Creatinine Clearance: 111.5 mL/min (by C-G formula based on SCr of 0.59 mg/dL).   Recent Results (from the past 240 hour(s))  Culture, blood (routine x 2)     Status: None   Collection Time: 01/09/18 12:02 AM  Result Value Ref Range Status   Specimen Description BLOOD LEFT HAND  Final   Special Requests   Final    BOTTLES DRAWN AEROBIC ONLY Blood Culture results may not be optimal due to an inadequate volume of blood received in culture bottles   Culture   Final    NO GROWTH 5 DAYS Performed at [facility], [place] [address] [place], [place] [address]    Report Status 01/14/2018 FINAL  Final  Culture, blood (routine x 2)     Status: None   Collection Time: 01/09/18 12:08 AM  Result Value Ref Range Status   Specimen Description BLOOD BLOOD LEFT FOREARM  Final   Special Requests   Final    BOTTLES DRAWN AEROBIC ONLY Blood Culture results may not be optimal due to an inadequate volume of blood received in culture bottles   Culture   Final    NO GROWTH 5 DAYS Performed at [facility], [place] [address] [place],  [address]    Report Status 01/14/2018 FINAL  Final         Radiology Studies: No results found.   Scheduled Meds: . acetaminophen  1,000 mg Oral Q8H  . bupivacaine liposome  20 mL Infiltration Once  . bupivacaine-EPINEPHrine  50 mL Infiltration Once  . cyclobenzaprine  10 mg Oral TID  . docusate sodium  100 mg Oral BID  . ferrous sulfate  325 mg Oral Q breakfast  . folic acid  1 mg Oral Daily  . heparin  5,000 Units Subcutaneous Q8H  . lactobacillus  1 g Oral TID WC  . magnesium oxide  400 mg Oral BID  . methadone  60 mg Oral Daily  . pantoprazole  40 mg Oral Daily  . polyethylene glycol  17 g Oral BID  . predniSONE  5 mg Oral Q breakfast  . senna-docusate  1 tablet Oral BID  . sodium chloride flush  10-40 mL Intracatheter Q12H  . tamsulosin  0.4 mg Oral Daily   Continuous Infusions: .  ceFAZolin (ANCEF) IV 2 g  (01/17/18 0531)  . lactated ringers 10 mL/hr at 01/15/18 2237  . methocarbamol (ROBAXIN) IV 100 mL/hr at 01/07/18 1947     LOS: 46 days    Time spent: 35 minutes.     Bonnielee Haff, MD [facility] Pager [phone]  If 7PM-7AM, please contact night-coverage [email or web address] Password [email or web address] 01/17/2018, 10:54 AM

## 2018-01-18 NOTE — Progress Notes (Signed)
PROGRESS NOTE    ZELA SOBIESKI  GUR:[id number] DOB: 1986/03/17 DOA: 12/02/2017 PCP: Patient, No Pcp Per    Brief Narrative: WILMARY LEVIT is a 32 y.o. 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 Patient was found to have epidural abscess. Patient  underwent  laminectomy and drainage on 7-25. Patient has remained on IV antibiotics, Cefazolin. She needs 8 weeks of IV antibiotics.    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 - (HIV, Hepatitis C, AB, RPR negative ) -Needs 8 weeks of IV cefazolin--end date 9-18. -needs weekly CMP and CBC.  -She will need to follow up with cardiology.  Patient will need repeat echocardiogram.  Patient to undergo transthoracic echocardiogram prior to discharge.  She has an appointment to see cardiology (Dr. Gwenlyn Found) in their office on 10/1.   ESR down to 65 CRP 0.8 -blood culture 8-28 No growth  -Discussed with ID, Dr Baxter Flattery, patient will need repeat Blood culture near to completing antibiotics. Patient will need to be discharge on Bactrim DS BID for at least a month.  Patient remains stable  Lumbar epidural abscess, S/P laminectomy and drainage on 7-25.  -MRI; on 8/14  showed persistent discitis possible epidural soft thickening, neurosurgery at that time recommended no further intervention.  -Urine retention resolved.  -Some chronic left LE weakness.  -Report numbness LE callf, some weakness.  -MRI 8-30; Status post L3-4 through L5-S1 posterior decompression without residual or recurrent epidural abscess. Small residual L5-S1 disc protrusion versus abscess improved from prior examination with decrease, mild potential L5-S1 discitis osteomyelitis. Persistent though improved abnormal enhancement of the cauda equina and intrathecal enhancement, possibly infectious. No canal stenosis. Persistent neural foraminal narrowing L4-5 and L5-S1: Moderate to severe on the LEFT at L5-S1. MRI findings were discussed with neurosurgery.  Findings better compared to previous MRI.  No change in treatment.  No need for new intervention.  Continue with physical therapy. Will need outpatient follow-up with neurosurgery.  ID is also recommended repeating lumbar MRI.  Will need to discuss timing of this with ID prior to discharge.  Severe tricuspid regurgitation Cardiology recommends completing IV antibiotics and then repeating echo to determine further need for intervention.  Patient will need repeat echocardiogram.  Initially plan was for this to be done on September 18 at cardiology office.  However since she will likely be discharged on September 19 requested cardiology to reschedule the appointment.  Appointment has been rescheduled for October 1.  We will do an echocardiogram prior to discharge.   Adrenal insufficiency A.m. cortisol 3.4.    ACTH stimulation test showed suboptimal response.  ACTH within normal range inappropriately.  Concern for secondary adrenal insufficiency.  Previous physician discussed with endocrinology (Dr. Buddy Duty) on  8/19 who recommended steroids and then follow-up as outpatient.  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  Patient was started on  prednisone 5 mg daily.  Patient will need outpatient follow-up with Dr. Buddy Duty.  Retained needle fragment.  X-ray confirms 8 mm needle fragment anterior to proximal humerus (prior surgery 05/2016, for retained needle with associated infection).   No signs or symptoms of acute infection at site.    This was discussed with with Silvestre Gunner (on 8/23) who agrees given asymptomatic, location (not in joint) no need for surgical intervention  Polysubstance abuse Patient and her mother have arranged for outpatient follow-up with addiction clinic.  Patient is currently on methadone.  Urinary retention and stool incontinence This has resolved.  Briefly required Foley for urinary retention which is now resolved.    Flomax was discontinued.  Constipation Bowel regimen.   Normocytic Anemia Hemoglobin remains stable.     Normocytic Anemia. Low folate. Hb improved to 9.  Folate being repleted.  Left LE numbness Likely secondary to her spinal disease.  Stable to improving.  Continue to monitor for now.  No DVT noted on Doppler study.    DVT prophylaxis:  Code Status: full code.  Family Communication: Discussed with the patient Disposition Plan: Patient will remain in the hospital until completion off IV antibiotics which will be September 18.   Consultants:   ID   Cardiology.   Neurosurgery  Phone discussion with Dr. Buddy Duty with endocrinology  Procedures:   12/05/17: L3, L4, L5 and S1 laminectomy for drainage of lumbar epidural abscess   TTE: 12/03/17 Study Conclusions; Vegetation noted on the septal leaflet of the tricuspid valve consistent with endocarditis. Cannot exlude vegetation on the anterior leaflet of the mitral valve. Suggest TEE to better assess.  Antimicrobials: Cefazolin 7-26  Subjective Patient feels well.  Denies any complaints today.     Objective: Vitals:   01/17/18 0600 01/17/18 1646 01/17/18 2151 01/18/18 0514  BP: 105/68 113/71 99/68 (!) 136/92  Pulse: 78  88 82 88  Resp:  16 16 16   Temp: 97.7 F (36.5 C) (!) 97.5 F (36.4 C) 97.9 F (36.6 C) 98.5 F (36.9 C)  TempSrc: Oral Oral Oral Oral  SpO2: 98% 98% 97% 98%  Weight:      Height:        Intake/Output Summary (Last 24 hours) at 01/18/2018 1030 Last data filed at 01/17/2018 1901 Gross per 24 hour  Intake 600 ml  Output -  Net 600 ml   Filed Weights   01/11/18 0500 01/12/18 0550 01/16/18 0556  Weight: 82.1 kg 83 kg 84 kg    Examination:  General exam: Awake alert.  In no distress. Respiratory system: Normal effort.  Clear to auscultation bilaterally Cardiovascular system: S1-S2 is normal regular.  No S3-S4.  No rubs.  Systolic murmur appreciated over the tricuspid area Gastrointestinal system: Abdomen is soft.  Nontender nondistended Central nervous system: Alert and oriented x3.  No obvious neurological deficits.    Data Reviewed: I have personally reviewed following labs and imaging studies  CBC: Recent Labs  Lab 01/14/18 0449  WBC 6.3  HGB 9.1*  HCT 30.5*  MCV 93.6  PLT 253   Basic Metabolic Panel: Recent Labs  Lab 01/14/18 0449  NA 136  K 4.1  CL 98  CO2 28  GLUCOSE 89  BUN 16  CREATININE 0.59  CALCIUM 9.2   GFR: Estimated Creatinine Clearance: 111.5 mL/min (by C-G formula based on SCr of  0.59 mg/dL).   Recent Results (from the past 240 hour(s))  Culture, blood (routine x 2)     Status: None   Collection Time: 01/09/18 12:02 AM  Result Value Ref Range Status   Specimen Description BLOOD LEFT HAND  Final   Special Requests   Final    BOTTLES DRAWN AEROBIC ONLY Blood Culture results may not be optimal due to an inadequate volume of blood received in culture bottles   Culture   Final    NO GROWTH 5 DAYS Performed at [facility], [place] [address] [place], [place] [address]    Report Status 01/14/2018 FINAL  Final  Culture, blood (routine x 2)     Status: None   Collection Time: 01/09/18 12:08 AM  Result Value Ref Range Status   Specimen  Description BLOOD BLOOD LEFT FOREARM  Final   Special Requests   Final    BOTTLES DRAWN AEROBIC ONLY Blood Culture results may not be optimal due to an inadequate volume of blood received in culture bottles   Culture   Final    NO GROWTH 5 DAYS Performed at [facility], [place] [address], [place], [place] [address]    Report Status 01/14/2018 FINAL  Final         Radiology Studies: No results found.   Scheduled Meds: . acetaminophen  1,000 mg Oral Q8H  . bupivacaine liposome  20 mL Infiltration Once  . bupivacaine-EPINEPHrine  50 mL Infiltration Once  . cyclobenzaprine  10 mg Oral TID  . docusate sodium  100 mg Oral BID  . ferrous sulfate  325 mg Oral Q breakfast  . folic acid  1 mg Oral Daily  . heparin  5,000 Units Subcutaneous Q8H  . lactobacillus  1 g Oral TID WC  . magnesium oxide  400 mg Oral BID  . methadone  60 mg Oral Daily  . pantoprazole  40 mg Oral Daily  . polyethylene glycol  17 g Oral BID  . predniSONE  5 mg Oral Q breakfast  . senna-docusate  1 tablet Oral BID  . sodium chloride flush  10-40 mL Intracatheter Q12H  . tamsulosin  0.4 mg Oral Daily   Continuous Infusions: .  ceFAZolin (ANCEF) IV 2 g (01/18/18 0647)  . lactated ringers 10 mL/hr at 01/15/18 2237  . methocarbamol (ROBAXIN) IV 100 mL/hr at 01/07/18 1947     LOS: 47 days    Time spent: 35 minutes.     Bonnielee Haff, MD [facility] Pager [phone]  If 7PM-7AM, please contact night-coverage [email or web address] Password [email or web address] 01/18/2018, 10:30 AM

## 2018-01-19 NOTE — Progress Notes (Signed)
PROGRESS NOTE    Kelly [name]  EZM:[id number] DOB: 04/11/1986 DOA: 12/02/2017 PCP: Patient, No Pcp Per    Brief Narrative: Kelly [name] is a 32 y.o. 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 Patient was found to have epidural abscess. Patient  underwent  laminectomy and drainage on 7-25. Patient has remained on IV antibiotics, Cefazolin. She needs 8 weeks of IV antibiotics.    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 - (HIV, Hepatitis C, AB, RPR negative ) -Needs 8 weeks of IV cefazolin--end date 9-18. -needs weekly CMP and CBC.  -She will need to follow up with cardiology.  Patient will need repeat echocardiogram.  Patient to undergo transthoracic echocardiogram prior to discharge.  She has an appointment to see cardiology (Dr. Gwenlyn Found) in their office on 10/1.   ESR down to 65 CRP 0.8 -blood culture 8-28 No growth  -As per Dr. Baxter Flattery with ID, patient will need repeat Blood culture near to completing antibiotics. Patient will need to be discharge on Bactrim DS BID for at least a month.  Would need to confirm outpatient antibiotic regimen with ID on 9/16. Patient remains stable  Lumbar  epidural abscess, S/P laminectomy and drainage on 7-25.  -MRI; on 8/14 showed persistent discitis possible epidural soft thickening, neurosurgery at that time recommended no further intervention.  -Urine retention resolved.  -Some chronic left LE weakness.  -Report numbness LE callf, some weakness.  -MRI 8-30; Status post L3-4 through L5-S1 posterior decompression without residual or recurrent epidural abscess. Small residual L5-S1 disc protrusion versus abscess improved from prior examination with decrease, mild potential L5-S1 discitis osteomyelitis. Persistent though improved abnormal enhancement of the cauda equina and intrathecal enhancement, possibly infectious. No canal stenosis. Persistent neural foraminal narrowing L4-5 and L5-S1: Moderate to severe on the LEFT at L5-S1. MRI findings were discussed with neurosurgery.  Findings better compared to previous MRI.  No change in treatment.  No need for new intervention.  Continue with physical therapy. Will need outpatient follow-up with neurosurgery.  ID is also recommended repeating lumbar MRI.  Will need to discuss timing of this with ID prior to discharge.  Severe tricuspid regurgitation Cardiology recommends completing IV antibiotics and then repeating echo to determine further need for intervention.  Patient will need repeat echocardiogram. Appointment has been rescheduled for October 1.  We will do an echocardiogram prior to discharge.   Adrenal insufficiency A.m. cortisol 3.4.    ACTH stimulation test showed suboptimal response.  ACTH within normal range inappropriately.  Concern for secondary adrenal insufficiency.  Previous physician discussed with endocrinology (Dr. Buddy Duty) on 8/19 who recommended steroids and then follow-up as outpatient.  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  Patient  was started on prednisone 5 mg daily.  Patient will need outpatient follow-up with Dr. Buddy Duty.  Retained needle fragment.  X-ray confirms 8  mm needle fragment anterior to proximal humerus (prior surgery 05/2016, for retained needle with associated infection).   No signs or symptoms of acute infection at site.    This was discussed with with Silvestre Gunner (on 8/23) who agrees given asymptomatic, location (not in joint) no need for surgical intervention  Polysubstance abuse Patient and her mother have arranged for outpatient follow-up with addiction clinic.  Patient is currently on methadone.  Urinary retention and stool incontinence This has resolved.  Briefly required Foley for urinary retention which is now resolved.    Flomax was discontinued.  Constipation Bowel regimen.   Normocytic Anemia Hemoglobin remains stable.     Normocytic Anemia. Low folate. Hb improved to 9.  Folate being repleted.  Left LE numbness Likely secondary to her spinal disease.  Stable to improving.  Continue to monitor for now.  No DVT noted on Doppler study.    DVT prophylaxis:  Code Status: full code.  Family Communication: Discussed with the patient Disposition Plan: Patient will remain in the hospital until completion off IV antibiotics which will be September 18.  Patient could be discharged later in the day on September 18 or on 19th.   Consultants:   ID   Cardiology.   Neurosurgery  Phone discussion with Dr. Buddy Duty with endocrinology  Procedures:   12/05/17: L3, L4, L5 and S1 laminectomy for drainage of lumbar epidural abscess   TTE: 12/03/17 Study Conclusions; Vegetation noted on the septal leaflet of the tricuspid valve consistent with endocarditis. Cannot exlude vegetation on the anterior leaflet of the mitral valve. Suggest TEE to better assess.  Antimicrobials: Cefazolin 7-26  Subjective Patient feels well.  Denies any complaints.  Objective: Vitals:   01/18/18 0514 01/18/18 1309 01/18/18 2210 01/19/18 0441  BP: (!) 136/92 117/83 119/74 112/75  Pulse: 88 80 85 80  Resp: 16 15 16 16   Temp: 98.5 F (36.9 C) 98.4  F (36.9 C) 98.3 F (36.8 C) 97.9 F (36.6 C)  TempSrc: Oral Oral Oral Oral  SpO2: 98% 96% 98% 98%  Weight:      Height:        Intake/Output Summary (Last 24 hours) at 01/19/2018 0931 Last data filed at 01/19/2018 0925 Gross per 24 hour  Intake 318 ml  Output 1500 ml  Net -1182 ml   Filed Weights   01/11/18 0500 01/12/18 0550 01/16/18 0556  Weight: 82.1 kg 83 kg 84 kg    Examination:  General exam: Awake alert.  In no distress Respiratory system: Normal effort.  Clear to auscultation bilaterally Cardiovascular system: S1-S2 is normal regular.  No S3-S4.  No rubs.  Systolic murmur appreciated over the tricuspid area Gastrointestinal system: Abdomen is soft.  Nontender nondistended.  Bowel sounds are present.  No masses organomegaly Central nervous system: Alert and oriented x3.  No focal neurological deficit   Data Reviewed: I have personally reviewed following labs and imaging studies  CBC: Recent Labs  Lab 01/14/18 0449  WBC 6.3  HGB 9.1*  HCT 30.5*  MCV 93.6  PLT 953   Basic Metabolic Panel: Recent Labs  Lab 01/14/18 0449  NA 136  K 4.1  CL 98  CO2 28  GLUCOSE 89  BUN 16  CREATININE 0.59  CALCIUM 9.2   GFR: Estimated Creatinine Clearance: 111.5 mL/min (by C-G formula based on SCr of 0.59 mg/dL).  Radiology Studies: No results found.   Scheduled Meds: . acetaminophen  1,000 mg Oral Q8H  . bupivacaine liposome  20 mL Infiltration Once  . bupivacaine-EPINEPHrine  50 mL Infiltration Once  . cyclobenzaprine  10 mg Oral TID  . docusate sodium  100 mg Oral BID  . ferrous sulfate  325 mg Oral Q breakfast  . folic acid  1 mg Oral Daily  . heparin  5,000 Units Subcutaneous Q8H  . lactobacillus  1 g Oral TID WC  . magnesium oxide  400 mg Oral BID  . methadone  60 mg Oral Daily  . pantoprazole  40 mg Oral Daily  . polyethylene glycol  17 g Oral BID  . predniSONE  5 mg Oral Q breakfast  . senna-docusate  1 tablet Oral BID  . sodium chloride flush   10-40 mL Intracatheter Q12H  . tamsulosin  0.4 mg Oral Daily   Continuous Infusions: .  ceFAZolin (ANCEF) IV 2 g (01/19/18 0546)  . lactated ringers 10 mL/hr at 01/18/18 1957  . methocarbamol (ROBAXIN) IV 100 mL/hr at 01/07/18 1947     LOS: 48 days    Time spent: 35 minutes.     Bonnielee Haff, MD [facility] Pager [phone]  If 7PM-7AM, please contact night-coverage [email or web address] Password [facility] 01/19/2018, 9:31 AM

## 2018-01-19 NOTE — Plan of Care (Signed)
  Problem: Nutrition: Goal: Adequate nutrition will be maintained Outcome: Progressing   Problem: Coping: Goal: Level of anxiety will decrease Outcome: Progressing   Problem: Skin Integrity: Goal: Risk for impaired skin integrity will decrease Outcome: Progressing

## 2018-01-20 DIAGNOSIS — K5900 Constipation, unspecified: Secondary | ICD-10-CM

## 2018-01-20 LAB — BASIC METABOLIC PANEL
Anion gap: 10 (ref 5–15)
BUN: 20 mg/dL (ref 6–20)
CALCIUM: 9.3 mg/dL (ref 8.9–10.3)
CO2: 27 mmol/L (ref 22–32)
CREATININE: 0.66 mg/dL (ref 0.44–1.00)
Chloride: 101 mmol/L (ref 98–111)
GFR calc Af Amer: >60 mL/min (ref >60)
GFR calc non Af Amer: >60 mL/min (ref >60)
GLUCOSE: 103 mg/dL — AB (ref 70–99)
Potassium: 4 mmol/L (ref 3.5–5.1)
Sodium: 138 mmol/L (ref 135–145)

## 2018-01-20 LAB — CBC
HEMATOCRIT: 33.4 % — AB (ref 36.0–46.0)
Hemoglobin: 10.2 g/dL — ABNORMAL LOW (ref 12.0–15.0)
MCH: 28.4 pg (ref 26.0–34.0)
MCHC: 30.5 g/dL (ref 30.0–36.0)
MCV: 93 fL (ref 78.0–100.0)
Platelets: 346 10*3/uL (ref 150–400)
RBC: 3.59 MIL/uL — ABNORMAL LOW (ref 3.87–5.11)
RDW: 15 % (ref 11.5–15.5)
WBC: 6.9 10*3/uL (ref 4.0–10.5)

## 2018-01-20 NOTE — Progress Notes (Signed)
Physical Therapy Treatment Patient Details Name: Kelly [name] MRN: [id number] DOB: [birth year] Today's Date: 01/20/2018    History of Present Illness Pt is a 32 y.o. female with sepsis, tachycardia, tachypnea, leukocytosis, hypotension.  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 PMH of polysubstance abuse, ADD, scoliosis sp back surgery (20 years ago per mom). PICC placed 12/08/17.    PT Comments    Pt performed gait training and functional mobility during session this afternoon with emphasis on safety during tx.  Progression to [facility] for gait trial of 651f.  Pt more noticeably fatigued with device and required cues for sequencing and use of device.  [facility]  OP PT remains appropriate at this time.      Follow Up Recommendations  Outpatient PT;Supervision for mobility/OOB     Equipment Recommendations  Rolling walker with 5" wheels;3in1 (PT);Cane    Recommendations for Other Services Rehab consult     Precautions / Restrictions Precautions Precautions: Back;Fall Precaution Booklet Issued: No Precaution Comments: Reviewed back precautions related to bed mobility.  Restrictions Weight Bearing Restrictions: No    Mobility  Bed Mobility               General bed mobility comments: Seated EOB  Transfers Overall transfer level: Modified independent Equipment used: Rolling walker (2 wheeled);Straight cane Transfers: Sit to/from Stand Sit to Stand: Supervision         General transfer comment: Cues for safety to make sure device is present before attempting to transfer.  Pt is able to stand without device she just appears unsteady.    Ambulation/Gait Ambulation/Gait assistance: Supervision;Min guard Gait Distance (Feet): 400 Feet(+ 600 ft.  400 ft with RW and 6049fwith SPC.  ) Assistive device: Rolling walker (2 wheeled);Straight cane Gait  Pattern/deviations: Step-through pattern;Trendelenburg;Drifts right/left;Narrow base of support Gait velocity: Decreased   General Gait Details: L foot IR, cues for sequencing [facility] with good carryover of step to pattern with SPC and quickly progressed to step through pattern.  Cues for reciprocal armswing during gait with SPC on LUE.  (Pt required several standing rest breaks when she broke sequencie to regain her balance and return to correct gait pattern.  )   Stairs Stairs: Yes Stairs assistance: Supervision Stair Management: Two rails Number of Stairs: 8 General stair comments: Cues for sequencing and hand placement on railinds.  Pt slow and guarded with increased time for foot clearance due to slowed coordination and impaired propricpetion.     Wheelchair Mobility    Modified Rankin (Stroke Patients Only)       Balance Overall balance assessment: Needs assistance Sitting-balance support: No upper extremity supported;Feet supported Sitting balance-Leahy Scale: Good Sitting balance - Comments: able to sit EOB independently w/o support with dynamic activity     Standing balance-Leahy Scale: Fair                              Cognition Arousal/Alertness: Awake/alert Behavior During Therapy: WFL for tasks assessed/performed Overall Cognitive Status: Within Functional Limits for tasks assessed                                        Exercises      General Comments  Pertinent Vitals/Pain Pain Assessment: Faces Faces Pain Scale: Hurts little more Pain Location: Lower back Pain Descriptors / Indicators: Sore Pain Intervention(s): Limited activity within patient's tolerance    Home Living                      Prior Function            PT Goals (current goals can now be found in the care plan section) Acute Rehab PT Goals Patient Stated Goal: go through intensive therapy before going home.  PT Goal  Formulation: With patient Time For Goal Achievement: 01/31/18 Potential to Achieve Goals: Good Progress towards PT goals: Progressing toward goals    Frequency    Min 3X/week      PT Plan Current plan remains appropriate    Co-evaluation              AM-PAC PT "6 Clicks" Daily Activity  Outcome Measure  Difficulty turning over in bed (including adjusting bedclothes, sheets and blankets)?: None Difficulty moving from lying on back to sitting on the side of the bed? : None Difficulty sitting down on and standing up from a chair with arms (e.g., wheelchair, bedside commode, etc,.)?: A Little Help needed moving to and from a bed to chair (including a wheelchair)?: A Little Help needed walking in hospital room?: A Little Help needed climbing 3-5 steps with a railing? : A Little 6 Click Score: 20    End of Session Equipment Utilized During Treatment: Gait belt Activity Tolerance: Patient tolerated treatment well Patient left: in bed;with call bell/phone within reach;Other (comment) Nurse Communication: Mobility status PT Visit Diagnosis: Other abnormalities of gait and mobility (R26.89);Pain Pain - Right/Left: Left Pain - part of body: Hip     Time: 1335-1410 PT Time Calculation (min) (ACUTE ONLY): 35 min  Charges:  $Gait Training: 23-37 mins                     Governor Rooks, PTA Acute Rehabilitation Services Pager [phone] Office [phone]     Albina Gosney Eli Hose 01/20/2018, 2:36 PM

## 2018-01-20 NOTE — Progress Notes (Signed)
PROGRESS NOTE    Kelly [name]  TIW:[id number] DOB: 07/28/1985 DOA: 12/02/2017 PCP: Patient, No Pcp Per    Brief Narrative: Kelly [name] is a 32 y.o. 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 Patient was found to have epidural abscess. Patient  underwent  laminectomy and drainage on 7-25. Patient has remained on IV antibiotics, Cefazolin. She needs 8 weeks of IV antibiotics.    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 - (HIV, Hepatitis C, AB, RPR negative ) -8 weeks of IV cefazolin--end date 9-18. -Labs done this morning noted to be unremarkable. -She will need to follow up with cardiology.  Patient will need repeat echocardiogram.  Patient to undergo transthoracic echocardiogram prior to discharge.  She has an appointment to see cardiology (Dr. Gwenlyn Found) in their office on 10/1.   ESR down to 65 CRP 0.8 -blood culture 8-28 No growth  -As per Dr. Baxter Flattery with ID, patient will need repeat Blood culture near to completing antibiotics. Patient will need to be discharge on Bactrim DS BID for at least a month.  Would need to confirm outpatient antibiotic regimen with ID on 9/16. Patient remains  stable.  Lumbar epidural abscess, S/P laminectomy and drainage on 7-25.  -MRI; on 8/14 showed persistent discitis possible epidural soft thickening, neurosurgery at that time recommended no further intervention.  -Urine retention resolved.  -Some chronic left LE weakness.  -Report numbness LE callf, some weakness.  -MRI 8-30; Status post L3-4 through L5-S1 posterior decompression without residual or recurrent epidural abscess. Small residual L5-S1 disc protrusion versus abscess improved from prior examination with decrease, mild potential L5-S1 discitis osteomyelitis. Persistent though improved abnormal enhancement of the cauda equina and intrathecal enhancement, possibly infectious. No canal stenosis. Persistent neural foraminal narrowing L4-5 and L5-S1: Moderate to severe on the LEFT at L5-S1. MRI findings were discussed with neurosurgery.  Findings better compared to previous MRI.  No change in treatment.  No need for new intervention.  Continue with physical therapy. Will need outpatient follow-up with neurosurgery.  ID is also recommended repeating lumbar MRI.  Will need to discuss timing of this with ID prior to discharge.  Severe tricuspid regurgitation Cardiology recommends completing IV antibiotics and then repeating echo to determine further need for intervention.  Patient will need repeat echocardiogram. Appointment has been rescheduled for October 1.  We will do an echocardiogram prior to discharge.   Adrenal insufficiency A.m. cortisol 3.4.    ACTH stimulation test showed suboptimal response.  ACTH within normal range inappropriately.  Concern for secondary adrenal insufficiency.  Previous physician discussed with endocrinology (Dr. Buddy Duty) on 8/19 who recommended steroids and then follow-up as outpatient.  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  Patient was started on prednisone 5 mg daily.  Patient will need outpatient follow-up with Dr. Buddy Duty.  Retained needle fragment.    X-ray confirms 8 mm needle fragment anterior to proximal humerus (prior surgery 05/2016, for retained needle with associated infection).   No signs or symptoms of acute infection at site.    This was discussed with with Silvestre Gunner (on 8/23) who agrees given asymptomatic, location (not in joint) no need for surgical intervention  Polysubstance abuse Patient and her mother have arranged for outpatient follow-up with addiction clinic.  Patient is currently on methadone.  Urinary retention and stool incontinence This has resolved.  Briefly required Foley for urinary retention which is now resolved.    Flomax was discontinued.  Constipation Patient complains of not having had appropriate BM in the last many days.  She is on adequate bowel regimen.  She is going to try a fleets enema today.  Normocytic Anemia Hemoglobin remains stable.     Normocytic Anemia. Low folate. Hemoglobin is stable.  Folate is being repleted.  Left LE numbness Likely secondary to her spinal disease.  Stable to improving.  Continue to monitor for now.  No DVT noted on Doppler study.    DVT prophylaxis:  Code Status: full code.  Family Communication: Discussed with the patient Disposition Plan: Patient will remain in the hospital until completion off IV antibiotics which will be September 18.  Patient could be discharged later in the day on September 18 or on 19th.   Consultants:   ID   Cardiology.   Neurosurgery  Phone discussion with Dr. Buddy Duty with endocrinology  Procedures:   12/05/17: L3, L4, L5 and S1 laminectomy for drainage of lumbar epidural abscess   TTE: 12/03/17 Study Conclusions; Vegetation noted on the septal leaflet of the tricuspid valve consistent with endocarditis. Cannot exlude vegetation on the anterior leaflet of the mitral valve. Suggest TEE to better assess.  Antimicrobials: Cefazolin 7-26  Subjective Patient complains of constipation this morning.  Has not had good bowel  movement in many days.  She will try enema today.  No other complaints offered.  Objective: Vitals:   01/19/18 0441 01/19/18 1405 01/19/18 1948 01/20/18 0534  BP: 112/75 113/72 113/76 115/79  Pulse: 80 93 79 82  Resp: _0 Temp: 97.9 F (36.6 C) 98.1 F (36.7 C) 98.4 F (36.9 C) 98.3 F (36.8 C)  TempSrc: Oral Oral Oral Oral  SpO2: 98% 97% 98% 98%  Weight:      Height:        Intake/Output Summary (Last 24 hours) at 01/20/2018 0847 Last data filed at 01/20/2018 0049 Gross per 24 hour  Intake 318 ml  Output 400 ml  Net -82 ml   Filed Weights   01/11/18 0500 01/12/18 0550 01/16/18 0556  Weight: 82.1 kg 83 kg 84 kg    Examination:  General exam: Awake alert.  In no distress Respiratory system: Normal effort.  Clear to auscultation bilaterally Cardiovascular system: S1-S2 normal regular.  Systolic murmur appreciated over the tricuspid area Gastrointestinal system: Abdomen soft.  Nontender nondistended.  Bowel sounds are present.  No masses organomegaly Central nervous system: No obvious focal neurological deficits.   Data Reviewed: I have personally reviewed following labs and imaging studies  CBC: Recent Labs  Lab 01/14/18 0449 01/20/18 0348  WBC 6.3 6.9  HGB 9.1* 10.2*  HCT 30.5* 33.4*  MCV 93.6 93.0  PLT 346 415   Basic Metabolic Panel: Recent Labs  Lab  01/14/18 0449 01/20/18 0348  NA 136 138  K 4.1 4.0  CL 98 101  CO2 28 27  GLUCOSE 89 103*  BUN 16 20  CREATININE 0.59 0.66  CALCIUM 9.2 9.3   GFR: Estimated Creatinine Clearance: 111.5 mL/min (by C-G formula based on SCr of 0.66 mg/dL).   Radiology Studies: No results found.   Scheduled Meds: . acetaminophen  1,000 mg Oral Q8H  . bupivacaine liposome  20 mL Infiltration Once  . bupivacaine-EPINEPHrine  50 mL Infiltration Once  . cyclobenzaprine  10 mg Oral TID  . docusate sodium  100 mg Oral BID  . ferrous sulfate  325 mg Oral Q breakfast  . folic acid  1 mg Oral Daily  . heparin   5,000 Units Subcutaneous Q8H  . lactobacillus  1 g Oral TID WC  . magnesium oxide  400 mg Oral BID  . methadone  60 mg Oral Daily  . pantoprazole  40 mg Oral Daily  . polyethylene glycol  17 g Oral BID  . predniSONE  5 mg Oral Q breakfast  . senna-docusate  1 tablet Oral BID  . sodium chloride flush  10-40 mL Intracatheter Q12H  . tamsulosin  0.4 mg Oral Daily   Continuous Infusions: .  ceFAZolin (ANCEF) IV 2 g (01/20/18 0448)  . lactated ringers 10 mL/hr at 01/18/18 1957  . methocarbamol (ROBAXIN) IV 100 mL/hr at 01/07/18 1947     LOS: 49 days    Time spent: 35 minutes.     Bonnielee Haff, MD [facility] Pager [phone]  If 7PM-7AM, please contact night-coverage [email or web address] Password [facility] 01/20/2018, 8:47 AM

## 2018-01-20 NOTE — Progress Notes (Signed)
   01/20/18 1000  Clinical Encounter Type  Visited With Patient  Visit Type Initial;Spiritual support;Psychological support;Social support  Referral From Nurse  Spiritual Encounters  Spiritual Needs Emotional;Grief support  Stress Factors  Patient Stress Factors Major life changes   Met with patient, heard life story.  Supported in new life direction after dire medical situation.  Pt spoke about her sources of hope and people she has as support in her life, esp. 10yo son.    Myra Gianotti resident, [phone]

## 2018-01-21 MED ORDER — PSYLLIUM 95 % PO PACK
1.0000 | PACK | Freq: Every day | ORAL | Status: DC
  Administered 2018-01-21 – 2018-01-22 (×2): 1 via ORAL
  Filled 2018-01-21 (×10): qty 1

## 2018-01-21 NOTE — Progress Notes (Signed)
Occupational Therapy Treatment Patient Details Name: Kelly [name] MRN: [id number] DOB: 12-Aug-1985 Today's Date: 01/21/2018    History of present illness Pt is a 32 y.o. female with sepsis, tachycardia, tachypnea, leukocytosis, hypotension.  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 PMH of polysubstance abuse, ADD, scoliosis sp back surgery (20 years ago per mom). PICC placed 12/08/17.   OT comments  Pt met grooming goal this session and goal updated. Pt progressing past RW and needs mod cues for back precautions. Next session make sure patient wears her personal shoes for transfers.    Follow Up Recommendations  Supervision/Assistance - 24 hour;Outpatient OT    Equipment Recommendations  (will d/c to aunts house in [place])    Recommendations for Other Services      Precautions / Restrictions Precautions Precautions: Back;Fall Precaution Comments: cues during session for back precautions       Mobility Bed Mobility Overal bed mobility: Independent                Transfers Overall transfer level: Modified independent                    Balance                                           ADL either performed or assessed with clinical judgement   ADL Overall ADL's : Needs assistance/impaired     Grooming: Wash/dry hands;Modified independent                   Toilet Transfer: Supervision/safety;Regular Toilet;Grab bars(using IV pole as support)   Toileting- Clothing Manipulation and Hygiene: Supervision/safety;Sitting/lateral lean       Functional mobility during ADLs: Supervision/safety(IV pole as support) General ADL Comments: Pt completed adl with IV pole for support supervision. pt asking to progress to see outside so transfered to the windows at the front of the unit. pt navigated to and from the location without cues. pt with noticeable  limb on the right. pt reports "i do better with my shoes"     Vision       Perception     Praxis      Cognition Arousal/Alertness: Awake/alert Behavior During Therapy: WFL for tasks assessed/performed Overall Cognitive Status: Within Functional Limits for tasks assessed                                          Exercises     Shoulder Instructions       General Comments      Pertinent Vitals/ Pain       Pain Assessment: No/denies pain  Home Living                                          Prior Functioning/Environment              Frequency  Min 2X/week        Progress Toward Goals  OT Goals(current goals can now be found in the care plan section)  Progress towards OT goals: Progressing toward goals  Acute Rehab OT Goals Patient  Stated Goal: to go home and get real food OT Goal Formulation: With patient Time For Goal Achievement: 02/04/18 Potential to Achieve Goals: Good ADL Goals Pt Will Perform Lower Body Bathing: Independently;sit to/from stand Pt Will Perform Lower Body Dressing: Independently;sit to/from stand Pt Will Transfer to Toilet: Independently;ambulating;regular height toilet Pt Will Perform Toileting - Clothing Manipulation and hygiene: Independently;sit to/from stand Pt Will Perform Tub/Shower Transfer: with modified independence;ambulating;3 in 1;rolling walker  Plan Frequency remains appropriate;Discharge plan remains appropriate    Co-evaluation                 AM-PAC PT "6 Clicks" Daily Activity     Outcome Measure   Help from another person eating meals?: None Help from another person taking care of personal grooming?: None Help from another person toileting, which includes using toliet, bedpan, or urinal?: None Help from another person bathing (including washing, rinsing, drying)?: A Little Help from another person to put on and taking off regular upper body clothing?: None Help from  another person to put on and taking off regular lower body clothing?: A Little 6 Click Score: 22    End of Session    OT Visit Diagnosis: Unsteadiness on feet (R26.81);Other abnormalities of gait and mobility (R26.89);Pain Pain - Right/Left: Left   Activity Tolerance Patient tolerated treatment well   Patient Left in bed;with call bell/phone within reach;with family/visitor present   Nurse Communication Mobility status        Time: 1601-0932 OT Time Calculation (min): 14 min  Charges: OT General Charges $OT Visit: 1 Visit OT Treatments $Self Care/Home Management : 8-22 mins   Jeri Modena, OTR/L  Acute Rehabilitation Services Pager: [phone] Office: [phone] .    Parke Poisson B 01/21/2018, 3:20 PM

## 2018-01-21 NOTE — Progress Notes (Signed)
PROGRESS NOTE    Kelly [name]  ZES:[id number] DOB: 1985-08-31 DOA: 12/02/2017 PCP: Patient, No Pcp Per    Brief Narrative: Kelly [name] is a 32 y.o. 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 Patient was found to have epidural abscess. Patient  underwent  laminectomy and drainage on 7-25. Patient has remained on IV antibiotics, Cefazolin. She needs 8 weeks of IV antibiotics.    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 - (HIV, Hepatitis C, AB, RPR negative ) -8 weeks of IV cefazolin--end date 9-18. -Labs done this morning noted to be unremarkable. -She will need to follow up with cardiology.  Patient will need repeat echocardiogram.  Patient to undergo transthoracic echocardiogram prior to discharge.  She has an appointment to see cardiology (Dr. Gwenlyn Found) in their office on 10/1.   ESR down to 65 CRP 0.8 -blood culture 8-28 No growth  -As per Dr. Baxter Flattery with ID, patient will need repeat Blood culture near to completing antibiotics. Patient will need to be discharge on Bactrim DS BID for at least a month.  Would need to confirm outpatient antibiotic regimen with ID on 9/16. Patient remained  stable.  Lumbar epidural abscess, S/P laminectomy and drainage on 7-25.  -MRI; on 8/14 showed persistent discitis possible epidural soft thickening, neurosurgery at that time recommended no further intervention.  -Urine retention resolved.  -Some chronic left LE weakness.  -Report numbness LE callf, some weakness.  -MRI 8-30; Status post L3-4 through L5-S1 posterior decompression without residual or recurrent epidural abscess. Small residual L5-S1 disc protrusion versus abscess improved from prior examination with decrease, mild potential L5-S1 discitis osteomyelitis. Persistent though improved abnormal enhancement of the cauda equina and intrathecal enhancement, possibly infectious. No canal stenosis. Persistent neural foraminal narrowing L4-5 and L5-S1: Moderate to severe on the LEFT at L5-S1. MRI findings were discussed with neurosurgery.  Findings better compared to previous MRI.  No change in treatment.  No need for new intervention.  Continue with physical therapy. Will need outpatient follow-up with neurosurgery.  ID is also recommended repeating lumbar MRI.  Will need to discuss timing of this with ID prior to discharge.  Severe tricuspid regurgitation Cardiology recommends completing IV antibiotics and then repeating echo to determine further need for intervention.  Patient will need repeat echocardiogram. Appointment has been rescheduled for October 1.  We will do an echocardiogram prior to discharge.   Adrenal insufficiency A.m. cortisol 3.4.    ACTH stimulation test showed suboptimal response.  ACTH within normal range inappropriately.  Concern for secondary adrenal insufficiency.  Previous physician discussed with endocrinology (Dr. Buddy Duty) on 8/19 who recommended steroids and then follow-up as outpatient.  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  Patient was started on prednisone 5 mg daily.  Patient will need outpatient follow-up with Dr. Buddy Duty.  Retained needle fragment.    X-ray confirms 8 mm needle fragment anterior to proximal humerus (prior surgery 05/2016, for retained needle with associated infection).   No signs or symptoms of acute infection at site.    This was discussed with with Silvestre Gunner (on 8/23) who agrees given asymptomatic, location (not in joint) no need for surgical intervention  Polysubstance abuse Patient and her mother have arranged for outpatient follow-up with addiction clinic.  Patient is currently on methadone.  Patient has contacted one of the outpatient clinics to continue to receive methadone in the outpatient setting.  She will be seen there on Friday 9/20.  Urinary retention and stool incontinence This has resolved.  Briefly required Foley for urinary retention which is now resolved.    Flomax was discontinued.  Constipation Patient did not use enema yesterday.  She was encouraged to do so today.  Continue current bowel regimen for now.  Add Metamucil.  Normocytic Anemia. Low folate. Hemoglobin is stable.  Folate is being repleted.  Left LE numbness Likely secondary to her spinal disease.  Stable to improving.  Continue to monitor for now.  No DVT noted on Doppler study.    DVT prophylaxis:  Code Status: full code.  Family Communication: Discussed with the patient Disposition Plan: Patient will remain in the hospital until completion off IV antibiotics which will be September 18.  Patient to be discharged on September 19.   Consultants:   ID   Cardiology.   Neurosurgery  Phone discussion with Dr. Buddy Duty with endocrinology  Procedures:   12/05/17: L3, L4, L5 and S1 laminectomy for drainage of lumbar epidural abscess   TTE: 12/03/17 Study Conclusions; Vegetation noted on the septal leaflet of the tricuspid valve consistent with endocarditis. Cannot exlude vegetation on the anterior leaflet of the mitral valve. Suggest TEE to better assess.  Antimicrobials: Cefazolin 7-26  Subjective Patient feels well.  She is  going to try the enema today.  She did not feel like trying it yesterday.  No other complaints offered.    Objective: Vitals:   01/20/18 0534 01/20/18 1300 01/20/18 2123 01/21/18 0536  BP: 115/79 117/83 116/77 122/79  Pulse: 82 91 85 83  Resp: _0 Temp: 98.3 F (36.8 C) 98.5 F (36.9 C) 98.3 F (36.8 C) 98.4 F (36.9 C)  TempSrc: Oral Oral Oral Oral  SpO2: 98% 98% 99% 98%  Weight:      Height:        Intake/Output Summary (Last 24 hours) at 01/21/2018 0912 Last data filed at 01/21/2018 0548 Gross per 24 hour  Intake 680 ml  Output -  Net 680 ml   Filed Weights   01/11/18 0500 01/12/18 0550 01/16/18 0556  Weight: 82.1 kg 83 kg 84 kg    Examination:  General exam: Awake alert.  In no distress. Respiratory system: Effort.  Clear to auscultation bilaterally Cardiovascular system: S1-S2 is normal regular.  Systolic murmur appreciated over the tricuspid area Gastrointestinal system: Abdomen remains soft.  Multiple bruises due to Lovenox injections.  No masses organomegaly.  Nontender. Central nervous system: Alert and oriented x3.  No focal neurological deficits.  Left lower extremity weakness and numbness improving gradually.   Data Reviewed: I have personally reviewed following labs and imaging studies  CBC: Recent Labs  Lab 01/20/18 0348  WBC 6.9  HGB 10.2*  HCT 33.4*  MCV  93.0  PLT 483   Basic Metabolic Panel: Recent Labs  Lab 01/20/18 0348  NA 138  K 4.0  CL 101  CO2 27  GLUCOSE 103*  BUN 20  CREATININE 0.66  CALCIUM 9.3   GFR: Estimated Creatinine Clearance: 111.5 mL/min (by C-G formula based on SCr of 0.66 mg/dL).   Radiology Studies: No results found.   Scheduled Meds: . acetaminophen  1,000 mg Oral Q8H  . bupivacaine liposome  20 mL Infiltration Once  . bupivacaine-EPINEPHrine  50 mL Infiltration Once  . cyclobenzaprine  10 mg Oral TID  . docusate sodium  100 mg Oral BID  . ferrous sulfate  325 mg Oral Q breakfast  . folic acid   1 mg Oral Daily  . heparin  5,000 Units Subcutaneous Q8H  . lactobacillus  1 g Oral TID WC  . magnesium oxide  400 mg Oral BID  . methadone  60 mg Oral Daily  . pantoprazole  40 mg Oral Daily  . polyethylene glycol  17 g Oral BID  . predniSONE  5 mg Oral Q breakfast  . senna-docusate  1 tablet Oral BID  . sodium chloride flush  10-40 mL Intracatheter Q12H  . tamsulosin  0.4 mg Oral Daily   Continuous Infusions: .  ceFAZolin (ANCEF) IV 2 g (01/21/18 0539)  . lactated ringers 10 mL/hr at 01/21/18 0538  . methocarbamol (ROBAXIN) IV 100 mL/hr at 01/07/18 1947     LOS: 87 days     Kelly Haff, MD [facility] Pager [phone]  If 7PM-7AM, please contact night-coverage [email or web address] Password [email or web address] 01/21/2018, 9:12 AM

## 2018-01-22 DIAGNOSIS — M795 Residual foreign body in soft tissue: Secondary | ICD-10-CM

## 2018-01-22 NOTE — Progress Notes (Signed)
PROGRESS NOTE    REYGAN HEAGLE  ELF:[id number] DOB: 27-May-1985 DOA: 12/02/2017 PCP: Patient, No Pcp Per    Brief Narrative:  32 year old female who presented with muscle pains. She does havehistory of IV drug abuse and scoliosis. Ongoing symptoms for about 10 days, physical examination blood pressure 106/67, heart rate 115, temperature 98.2, respiratory rate 24. Moist mucous membranes, lungs clear to auscultation bilaterally, heart S1-S2 present and rhythmic, abdomen soft nontender, no lower extremity edema. Sodium 130, potassium 3.6, chloride 90, bicarb 27, glucose 113, BUN 23, creatinine 1.0, AST 13, ALT 6, white count 28.3, hemoglobin 9.5, hematocrit 20.7, platelets 402.Chest x-ray with right-baseatelectasis. Hardware in thoracic spine. Urine analysis specific gravity 1.014, 30 protein, white cells 21-50, RBCs greater than 50. CT chest with multiple focal consolidation both lungs including multiple cavitary lesions suggesting septic emboli.  Patient was admitted to the hospitalwith theworking diagnosis of sepsis.   Work-up with echocardiography revealed tricuspid valve endocarditis, cultures positive for MSSA.  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 Will need repeat  echocardiogram after completing IV antibiotic therapy and will be discharge on bactrim per ID recommendations. Patient will need follow up imaging before discharge.  2, AKI(resolved) with hyponatremia. Renal function has remained stable with serum cr 0.66 with K at 4.0 and serum bicarbonate at 27 on 9/9.  3. Polysubstance abuse.On methadone per home regimen.   4. Scoliosis.Continue pain control, out of bed as tolerted.   5. Adrenal insuficiency. Continue systemic steroids, will need outpatient follow up with endocrinology.   DVT prophylaxis:enoxaparin Code Status:full Family Communication:no family at the bedside Disposition Plan/ discharge barriers:pending completion of IV antibiotic therapy, September 18.   Consultants:  ID  Procedures:    Antimicrobials: Cefazolin IV  Subjective: No nausea or vomiting, no chest pain or dyspnea. Out of bed as tolerated.   Objective: Vitals:   01/21/18 0536 01/21/18 1443 01/21/18 2132 01/22/18 0537  BP: 122/79 (!) 130/93 125/82 (!) 133/94  Pulse: 83 97 81 80  Resp: 16 16 18 18   Temp: 98.4 F (36.9 C) (!) 97.5 F (36.4 C) 98 F (36.7 C) (!) 97.5 F (36.4 C)  TempSrc: Oral Oral Oral Oral  SpO2: 98% 98% 99% 100%  Weight:      Height:        Intake/Output Summary (Last 24 hours) at 01/22/2018 0919 Last data filed at 01/21/2018 1444 Gross per 24 hour  Intake 450 ml  Output -  Net 450 ml   Filed Weights   01/11/18 0500 01/12/18 0550 01/16/18 0556  Weight: 82.1 kg 83 kg 84 kg    Examination:   General: Not in pain or dyspnea, deconditioned  Neurology: Awake and alert, non focal  E ENT: no pallor, no icterus, oral mucosa moist Cardiovascular: No JVD. S1-S2 present, rhythmic, no gallops, rubs, or murmurs. No  lower extremity edema. Pulmonary: vesicular breath sounds bilaterally, adequate air movement, no wheezing, rhonchi or rales. Gastrointestinal. Abdomen with no organomegaly, non tender, no rebound or  guarding Skin. No rashes Musculoskeletal: no joint deformities PICC in the right upper extremity.     Data Reviewed: I have personally reviewed following labs and imaging studies  CBC: Recent Labs  Lab 01/20/18 0348  WBC 6.9  HGB 10.2*  HCT 33.4*  MCV 93.0  PLT 675   Basic Metabolic Panel: Recent Labs  Lab 01/20/18 0348  NA 138  K 4.0  CL 101  CO2 27  GLUCOSE 103*  BUN 20  CREATININE 0.66  CALCIUM 9.3   GFR: Estimated Creatinine Clearance: 111.5 mL/min (by C-G formula based on SCr of 0.66 mg/dL). Liver Function Tests: No results for input(s): AST, ALT, ALKPHOS, BILITOT, PROT, ALBUMIN in the last 168 hours. No results for input(s): LIPASE, AMYLASE in the last 168 hours. No results for input(s): AMMONIA in the last 168 hours. Coagulation Profile: No results for input(s): INR, PROTIME in the last 168 hours. Cardiac Enzymes: No results for input(s): CKTOTAL, CKMB, CKMBINDEX, TROPONINI in the last 168 hours. BNP (last 3 results) No results for input(s): PROBNP in the last 8760 hours. HbA1C: No results for input(s): HGBA1C in the last 72 hours. CBG: No results for input(s): GLUCAP in the last 168 hours. Lipid Profile: No results for input(s): CHOL, HDL, LDLCALC, TRIG, CHOLHDL, LDLDIRECT in the last 72 hours. Thyroid Function Tests: No results for input(s): TSH, T4TOTAL, FREET4, T3FREE, THYROIDAB in the last 72 hours. Anemia Panel: No results for input(s): VITAMINB12, FOLATE, FERRITIN, TIBC, IRON, RETICCTPCT in the last 72 hours.    Radiology Studies: I have reviewed all of the imaging during this hospital visit personally     Scheduled Meds: . acetaminophen  1,000 mg Oral Q8H  . bupivacaine liposome  20 mL Infiltration Once  . bupivacaine-EPINEPHrine  50 mL Infiltration Once  . cyclobenzaprine  10 mg Oral TID  . docusate sodium  100 mg Oral BID  . ferrous sulfate  325 mg Oral Q breakfast  . folic acid  1 mg Oral Daily  . heparin  5,000 Units  Subcutaneous Q8H  . lactobacillus  1 g Oral TID WC  . magnesium oxide  400 mg Oral BID  . methadone  60 mg Oral Daily  . pantoprazole  40 mg Oral Daily  . polyethylene glycol  17 g Oral BID  . predniSONE  5 mg Oral Q breakfast  . psyllium  1 packet Oral Daily  . senna-docusate  1 tablet Oral BID  . sodium chloride flush  10-40 mL Intracatheter Q12H  . tamsulosin  0.4 mg Oral Daily   Continuous Infusions: .  ceFAZolin (ANCEF) IV 2 g (01/22/18 0551)  . lactated ringers 10 mL/hr at 01/21/18 0538  . methocarbamol (ROBAXIN) IV 100 mL/hr at 01/07/18 1947     LOS: 51 days        Arcangel Minion Gerome Apley, MD [facility] Pager [phone]

## 2018-01-22 NOTE — Progress Notes (Signed)
Physical Therapy Treatment Patient Details Name: Kelly [name] MRN: [id number] DOB: 28-Aug-1985 Today's Date: 01/22/2018    History of Present Illness Pt is a 32 y.o. female with sepsis, tachycardia, tachypnea, leukocytosis, hypotension.  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 PMH of polysubstance abuse, ADD, scoliosis sp back surgery (20 years ago per mom). PICC placed 12/08/17.    PT Comments    Pt more challenged outdoors today with SPC.  Multiple LOB with use of devices as she was noticeably fatigued with LLE affected.  Pt continues to benefit from RW for longer distances and uneven surfaces.  Kasandra Knudsen remains appropriate for short distances in home.  Plan for stair training next session and higher level balance activities.  Pt continues to progress well and motivated for hospitalization to end.     Follow Up Recommendations  Outpatient PT;Supervision for mobility/OOB     Equipment Recommendations  Rolling walker with 5" wheels;3in1 (PT);Cane    Recommendations for Other Services Rehab consult     Precautions / Restrictions Precautions Precautions: Back;Fall Precaution Booklet Issued: No Precaution Comments: cues during session for back precautions Restrictions Weight Bearing Restrictions: No    Mobility  Bed Mobility               General bed mobility comments: Pt sitting in recliner chair on arrival.   Transfers Overall transfer level: Needs assistance Equipment used: Rolling walker (2 wheeled);Straight cane   Sit to Stand: Supervision         General transfer comment: Cues for use/hand placement with cane, Mod I with RW.    Ambulation/Gait Ambulation/Gait assistance: Min assist;Supervision Gait Distance (Feet): 600 Feet Assistive device: Straight cane;IV Pole(Pt push IV pole x 100 ft.  remainder of gt performed with SPC on level and unlevel surfaces.  ) Gait  Pattern/deviations: Step-through pattern;Trendelenburg;Drifts right/left;Narrow base of support     General Gait Details: LOB outdoors with cane x2 on unlevel surfaces.  Pt fatigues quickly with SPC on unlevel surfaces as it proves to be a real challenge for her.  On level surfaces indoors she requires supervision to min as she had another LOB indoors with SPC when fatigued.     Stairs             Wheelchair Mobility    Modified Rankin (Stroke Patients Only)       Balance     Sitting balance-Leahy Scale: Good       Standing balance-Leahy Scale: Poor                              Cognition Arousal/Alertness: Awake/alert Behavior During Therapy: WFL for tasks assessed/performed Overall Cognitive Status: Within Functional Limits for tasks assessed                                        Exercises      General Comments        Pertinent Vitals/Pain Pain Assessment: Faces Faces Pain Scale: Hurts even more Pain Location: Lower back/L leg Pain Descriptors / Indicators: Sore Pain Intervention(s): Monitored during session;Repositioned    Home Living                      Prior Function  PT Goals (current goals can now be found in the care plan section) Acute Rehab PT Goals Patient Stated Goal: to go home and get real food Potential to Achieve Goals: Good Progress towards PT goals: Progressing toward goals    Frequency    Min 3X/week      PT Plan Current plan remains appropriate    Co-evaluation              AM-PAC PT "6 Clicks" Daily Activity  Outcome Measure  Difficulty turning over in bed (including adjusting bedclothes, sheets and blankets)?: None Difficulty moving from lying on back to sitting on the side of the bed? : None Difficulty sitting down on and standing up from a chair with arms (e.g., wheelchair, bedside commode, etc,.)?: A Little Help needed moving to and from a bed to chair  (including a wheelchair)?: A Little Help needed walking in hospital room?: A Little Help needed climbing 3-5 steps with a railing? : A Little 6 Click Score: 20    End of Session Equipment Utilized During Treatment: Gait belt Activity Tolerance: Patient tolerated treatment well Patient left: in bed;with call bell/phone within reach;Other (comment) Nurse Communication: Mobility status PT Visit Diagnosis: Other abnormalities of gait and mobility (R26.89);Pain Pain - Right/Left: Left Pain - part of body: Hip     Time: 9396-8864 PT Time Calculation (min) (ACUTE ONLY): 27 min  Charges:  $Gait Training: 23-37 mins                     Governor Rooks, PTA Acute Rehabilitation Services Pager [phone] Office [phone]     Toriano Aikey Eli Hose 01/22/2018, 6:49 PM

## 2018-01-23 NOTE — Progress Notes (Addendum)
PROGRESS NOTE    Kelly [name]  GYJ:[id number] DOB: 10/27/1985 DOA: 12/02/2017 PCP: Patient, No Pcp Per    Brief Narrative:  32 year old female who presented with muscle pains. She does havehistory of IV drug abuse and scoliosis. Ongoing symptoms for about 10 days, physical examination blood pressure 106/67, heart rate 115, temperature 98.2, respiratory rate 24. Moist mucous membranes, lungs clear to auscultation bilaterally, heart S1-S2 present and rhythmic, abdomen soft nontender, no lower extremity edema. Sodium 130, potassium 3.6, chloride 90, bicarb 27, glucose 113, BUN 23, creatinine 1.0, AST 13, ALT 6, white count 28.3, hemoglobin 9.5, hematocrit 20.7, platelets 402.Chest x-ray with right-baseatelectasis. Hardware in thoracic spine. Urine analysis specific gravity 1.014, 30 protein, white cells 21-50, RBCs greater than 50. CT chest with multiple focal consolidation both lungs including multiple cavitary lesions suggesting septic emboli.  Patient was admitted to the hospitalwith theworking diagnosis of sepsis.   Work-up with echocardiography revealed tricuspid valve endocarditis, cultures positive for MSSA.   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 IV cefazolin. Plan for MRI of the lumbar spine on 09/16, per ID  recommendations. Plan for echocardiogram next week.   2, AKI(resolved) with hyponatremia.patient tolerating po well, no nausea or vomiting.   3. Polysubstance abuse. Continuemethadone per home regimen.  4. Scoliosis.Pain is well controlled.  .   5. Adrenal insuficiency. Will discontinue prednisone and will follow on clinical response, possible transitory adrenal insufficiency.   DVT prophylaxis:enoxaparin Code Status:full Family Communication:no family at the bedside Disposition Plan/ discharge barriers:pending completion of IV antibiotic therapy, September 18.   Consultants:  ID  Procedures:    Antimicrobials: Cefazolin IV  Subjective: Patient feeling well, no nausea or vomiting, tolerating po well.   Objective: Vitals:   01/22/18 0537 01/22/18 1300 01/22/18 2124 01/23/18 0504  BP: (!) 133/94 120/82 112/70 108/69  Pulse: 80 87 76 83  Resp: 18  16 16   Temp: (!) 97.5 F (36.4 C) 97.9 F (36.6 C) 98 F (36.7 C) (!) 97.5 F (36.4 C)  TempSrc: Oral Oral Oral Oral  SpO2: 100% 98% 99% 99%  Weight:      Height:       No intake or output data in the 24 hours ending 01/23/18 1310 Filed Weights   01/11/18 0500 01/12/18 0550 01/16/18 0556  Weight: 82.1 kg 83 kg 84 kg    Examination:   General: Not in pain or dyspnea, deconditioned  Neurology: Awake and alert, non focal  E ENT: no pallor, no icterus, oral mucosa moist Cardiovascular: No JVD. S1-S2 present, rhythmic, no gallops, rubs, or murmurs. Trace lower extremity edema. Pulmonary: positive breath sounds bilaterally, adequate air movement, no wheezing, rhonchi or rales. Gastrointestinal. Abdomen with no organomegaly, non tender, no rebound or guarding Skin. No rashes Musculoskeletal: no joint deformities     Data Reviewed: I have personally reviewed following labs and imaging  studies  CBC: Recent Labs  Lab 01/20/18 0348  WBC 6.9  HGB 10.2*  HCT 33.4*  MCV 93.0  PLT 161    Basic Metabolic Panel: Recent Labs  Lab 01/20/18 0348  NA 138  K 4.0  CL 101  CO2 27  GLUCOSE 103*  BUN 20  CREATININE 0.66  CALCIUM 9.3   GFR: Estimated Creatinine Clearance: 111.5 mL/min (by C-G formula based on SCr of 0.66 mg/dL). Liver Function Tests: No results for input(s): AST, ALT, ALKPHOS, BILITOT, PROT, ALBUMIN in the last 168 hours. No results for input(s): LIPASE, AMYLASE in the last 168 hours. No results for input(s): AMMONIA in the last 168 hours. Coagulation Profile: No results for input(s): INR, PROTIME in the last 168 hours. Cardiac Enzymes: No results for input(s): CKTOTAL, CKMB, CKMBINDEX, TROPONINI in the last 168 hours. BNP (last 3 results) No results for input(s): PROBNP in the last 8760 hours. HbA1C: No results for input(s): HGBA1C in the last 72 hours. CBG: No results for input(s): GLUCAP in the last 168 hours. Lipid Profile: No results for input(s): CHOL, HDL, LDLCALC, TRIG, CHOLHDL, LDLDIRECT in the last 72 hours. Thyroid Function Tests: No results for input(s): TSH, T4TOTAL, FREET4, T3FREE, THYROIDAB in the last 72 hours. Anemia Panel: No results for input(s): VITAMINB12, FOLATE, FERRITIN, TIBC, IRON, RETICCTPCT in the last 72 hours.    Radiology Studies: I have reviewed all of the imaging during this hospital visit personally     Scheduled Meds: . acetaminophen  1,000 mg Oral Q8H  . bupivacaine liposome  20 mL Infiltration Once  . bupivacaine-EPINEPHrine  50 mL Infiltration Once  . cyclobenzaprine  10 mg Oral TID  . docusate sodium  100 mg Oral BID  . ferrous sulfate  325 mg Oral Q breakfast  . folic acid  1 mg Oral Daily  . heparin  5,000 Units Subcutaneous Q8H  . lactobacillus  1 g Oral TID WC  . magnesium oxide  400 mg Oral BID  . methadone  60 mg Oral Daily  . pantoprazole  40 mg Oral Daily  . polyethylene glycol  17 g Oral BID  . predniSONE  5 mg Oral Q breakfast  . psyllium  1 packet Oral Daily  . senna-docusate  1  tablet Oral BID  . sodium chloride flush  10-40 mL Intracatheter Q12H  . tamsulosin  0.4 mg Oral Daily   Continuous Infusions: .  ceFAZolin (ANCEF) IV 2 g (01/23/18 0500)  . lactated ringers 10 mL/hr at 01/23/18 0808  . methocarbamol (ROBAXIN) IV 100 mL/hr at 01/07/18 1947     LOS: 52 days        Matie Dimaano Gerome Apley, MD [facility] Pager [phone]

## 2018-01-24 NOTE — Progress Notes (Signed)
   01/24/18 1500  Clinical Encounter Type  Visited With Patient;Health care provider  Visit Type Follow-up   Attempted to follow-up w/ pt.  Spoke to RN before entering room; RN noted that the pt might be upset.  Pt did appear to be upset and likely had been crying.  Asked if it was okay for me to visit and she politely asked if I could return later.  I told her I would not be able to return today.  I erroreously told her I could return Monday if she were still here, not recalling that I am scheduled off on Monday (I updated the RN on this after I left the room).  I did also tell Latesha that if it isn't too busy during my Sat overnight on-call shift, I will try to stop by to at least say hi.  I left a sheet w/ a gray-scale sheet w/ a large heart w/ "Embrace Hope" on it and some crayons w/o explaining it to her since she wasn't up to conversation.  Myra Gianotti resident, [phone]

## 2018-01-24 NOTE — Progress Notes (Signed)
Occupational Therapy Treatment Patient Details Name: Kelly [name] MRN: [id number] DOB: [birth year] Today's Date: 01/24/2018    History of present illness Pt is a 32 y.o. female with sepsis, tachycardia, tachypnea, leukocytosis, hypotension.  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 PMH of polysubstance abuse, ADD, scoliosis sp back surgery (20 years ago per mom). PICC placed 12/08/17.   OT comments  Progressing well with OT goals.  Able to complete grooming tasks in standing.  Simulated tub transfers with different transfer techniques.  Pt. Remains guarded and nervous for weight bearing through LLE when stepping into the tub.  Will continue to to work on tub transfers at next session in preparation for home.    Follow Up Recommendations  Supervision/Assistance - 24 hour;Outpatient OT    Equipment Recommendations       Recommendations for Other Services      Precautions / Restrictions Precautions Precautions: Back;Fall       Mobility Bed Mobility               General bed mobility comments: seated eob at beginning and end of session  Transfers Overall transfer level: Needs assistance Equipment used: (IV POLE)                  Balance                                           ADL either performed or assessed with clinical judgement   ADL Overall ADL's : Needs assistance/impaired     Grooming: Modified independent;Applying deodorant;Wash/dry face;Wash/dry Nurse, mental health Details (indicate cue type and reason): c/o LE fatique with standing                         Tub/ Shower Transfer: Tub transfer;Moderate assistance;Ambulation Tub/Shower Transfer Details (indicate cue type and reason): side step in b.room over simulated height of tub for R faucet, able to clear and step over with LLE but very nervous for weight bearing while "stepping over" with  RLE.  placed 3n1 and had pt. return demo of 2nd option to sit at an angle on 3n1 but concens for maintaining back precautions.  feel pt. could get strength and confidence for transfer but if not we could enter on RLE and turn in tub as last option.   Functional mobility during ADLs: Supervision/safety(IV pole for support) General ADL Comments: Mod. I  in standing for grooming tasks, did c/o LE fatigue after task completion.  spoke with PTA regarding plans for LLE strengthing as a part of tx. planning for tub transfer     Vision       Perception     Praxis      Cognition Arousal/Alertness: Awake/alert Behavior During Therapy: WFL for tasks assessed/performed Overall Cognitive Status: Within Functional Limits for tasks assessed                                          Exercises     Shoulder Instructions       General Comments      Pertinent Vitals/ Pain       Pain Assessment: No/denies pain  Home Living  Prior Functioning/Environment              Frequency  Min 2X/week        Progress Toward Goals  OT Goals(current goals can now be found in the care plan section)  Progress towards OT goals: Progressing toward goals     Plan Frequency remains appropriate;Discharge plan remains appropriate    Co-evaluation                 AM-PAC PT "6 Clicks" Daily Activity     Outcome Measure   Help from another person eating meals?: None Help from another person taking care of personal grooming?: None Help from another person toileting, which includes using toliet, bedpan, or urinal?: None Help from another person bathing (including washing, rinsing, drying)?: A Little Help from another person to put on and taking off regular upper body clothing?: None Help from another person to put on and taking off regular lower body clothing?: A Little 6 Click Score: 22    End of Session Equipment  Utilized During Treatment: Other (comment)(iv pole)  OT Visit Diagnosis: Unsteadiness on feet (R26.81);Other abnormalities of gait and mobility (R26.89);Pain Pain - Right/Left: Left   Activity Tolerance Patient tolerated treatment well   Patient Left in bed;with call bell/phone within reach   Nurse Communication          Time: 1229-1252 OT Time Calculation (min): 23 min  Charges: OT General Charges $OT Visit: 1 Visit OT Treatments $Self Care/Home Management : 23-37 mins   Janice Coffin, COTA/L 01/24/2018, 1:28 PM

## 2018-01-24 NOTE — Progress Notes (Signed)
   01/24/18 1100  Clinical Encounter Type  Visited With Patient not available;Health care provider  Visit Type Follow-up  Recommendations  (chaplain plans to return after 1 hr or more)   Per RN, pt planned to sleep for an hour.  Barring emergent/urgent situations, chaplain plans to return after an hour for f/u from initial visit on M, 9/9.  Myra Gianotti resident, [phone]

## 2018-01-24 NOTE — Progress Notes (Signed)
PROGRESS NOTE    Kelly [name]  CXK:[id number] DOB: 02/18/1986 DOA: 12/02/2017 PCP: Patient, No Pcp Per    Brief Narrative:  32 year old female who presented with muscle pains. She does havehistory of IV drug abuse and scoliosis. Ongoing symptoms for about 10 days, physical examination blood pressure 106/67, heart rate 115, temperature 98.2, respiratory rate 24. Moist mucous membranes, lungs clear to auscultation bilaterally, heart S1-S2 present and rhythmic, abdomen soft nontender, no lower extremity edema. Sodium 130, potassium 3.6, chloride 90, bicarb 27, glucose 113, BUN 23, creatinine 1.0, AST 13, ALT 6, white count 28.3, hemoglobin 9.5, hematocrit 20.7, platelets 402.Chest x-ray with right-baseatelectasis. Hardware in thoracic spine. Urine analysis specific gravity 1.014, 30 protein, white cells 21-50, RBCs greater than 50. CT chest with multiple focal consolidation both lungs including multiple cavitary lesions suggesting septic emboli.  Patient was admitted to the hospitalwith theworking diagnosis of sepsis.  Work-up with echocardiography revealed tricuspid valve endocarditis, cultures positive for MSSA.   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 Follow on repeat blood cultures. Tolerating well IV cefazolin. MRI of the lumbar spine on 09/16, per ID  recommendations. Plan for echocardiogram before discharge.  2, AKI(resolved) with hyponatremia.resolved   3. Polysubstance abuse. Onmethadone per home regimen.  4. Scoliosis.Out of bed. .   5. Adrenal insuficiency. Tolerating well, being of prednisone. MRI with 1 mm pituitary adenoma. Will do cosyntropin stimulation test before discharge.   DVT prophylaxis:enoxaparin Code Status:full Family Communication:no family at the bedside Disposition Plan/ discharge barriers:pending completion of IV antibiotic therapy, September 18.   Consultants:  ID  Procedures:    Antimicrobials: Cefazolin IV  Subjective: Patient is feeling well, no nausea or vomiting, no chest pain, no dyspnea.   Objective: Vitals:   01/23/18 2133 01/24/18 0539 01/24/18 1456 01/24/18 1507  BP: 114/77 108/66 120/80 127/80  Pulse: 81 77 (!) 105 92  Resp: 16 16 18 16   Temp: 98.2 F (36.8 C) 97.9 F (36.6 C) (!) 97.5 F (36.4 C) 98.9 F (37.2 C)  TempSrc: Oral Oral Oral Oral  SpO2: 97% 99% 98% 93%  Weight:      Height:       No intake or output data in the 24 hours ending 01/24/18 1631 Filed Weights   01/11/18 0500 01/12/18 0550 01/16/18 0556  Weight: 82.1 kg 83 kg 84 kg    Examination:   General: Not in pain or dyspnea Neurology: Awake and alert, non focal  E ENT: no pallor, no icterus, oral mucosa moist Cardiovascular: No JVD. S1-S2 present, rhythmic, no gallops, rubs, or murmurs. No lower extremity edema. Pulmonary: vesicular breath sounds bilaterally, adequate air movement, no wheezing, rhonchi or rales. Gastrointestinal. Abdomen with no organomegaly, non tender, no rebound or guarding Skin. No rashes Musculoskeletal: no joint deformities     Data Reviewed: I have personally reviewed following labs and imaging studies  CBC: Recent Labs  Lab 01/20/18  0348  WBC 6.9  HGB 10.2*  HCT 33.4*  MCV 93.0  PLT 409   Basic Metabolic Panel: Recent Labs  Lab  01/20/18 0348  NA 138  K 4.0  CL 101  CO2 27  GLUCOSE 103*  BUN 20  CREATININE 0.66  CALCIUM 9.3   GFR: Estimated Creatinine Clearance: 111.5 mL/min (by C-G formula based on SCr of 0.66 mg/dL). Liver Function Tests: No results for input(s): AST, ALT, ALKPHOS, BILITOT, PROT, ALBUMIN in the last 168 hours. No results for input(s): LIPASE, AMYLASE in the last 168 hours. No results for input(s): AMMONIA in the last 168 hours. Coagulation Profile: No results for input(s): INR, PROTIME in the last 168 hours. Cardiac Enzymes: No results for input(s): CKTOTAL, CKMB, CKMBINDEX, TROPONINI in the last 168 hours. BNP (last 3 results) No results for input(s): PROBNP in the last 8760 hours. HbA1C: No results for input(s): HGBA1C in the last 72 hours. CBG: No results for input(s): GLUCAP in the last 168 hours. Lipid Profile: No results for input(s): CHOL, HDL, LDLCALC, TRIG, CHOLHDL, LDLDIRECT in the last 72 hours. Thyroid Function Tests: No results for input(s): TSH, T4TOTAL, FREET4, T3FREE, THYROIDAB in the last 72 hours. Anemia Panel: No results for input(s): VITAMINB12, FOLATE, FERRITIN, TIBC, IRON, RETICCTPCT in the last 72 hours.    Radiology Studies: I have reviewed all of the imaging during this hospital visit personally     Scheduled Meds: . acetaminophen  1,000 mg Oral Q8H  . bupivacaine liposome  20 mL Infiltration Once  . bupivacaine-EPINEPHrine  50 mL Infiltration Once  . cyclobenzaprine  10 mg Oral TID  . ferrous sulfate  325 mg Oral Q breakfast  . heparin  5,000 Units Subcutaneous Q8H  . lactobacillus  1 g Oral TID WC  . magnesium oxide  400 mg Oral BID  . methadone  60 mg Oral Daily  . pantoprazole  40 mg Oral Daily  . polyethylene glycol  17 g Oral BID  . psyllium  1 packet Oral Daily  . senna-docusate  1 tablet Oral BID  . sodium chloride flush  10-40 mL Intracatheter Q12H  . tamsulosin  0.4 mg Oral Daily   Continuous Infusions: .  ceFAZolin (ANCEF) IV 2  g (01/24/18 1517)  . lactated ringers 10 mL/hr at 01/23/18 7353     LOS: 39 days        Mauricio Gerome Apley, MD [facility] Pager [phone]

## 2018-01-24 NOTE — Progress Notes (Signed)
Physical Therapy Treatment Patient Details Name: Kelly [name] MRN: [id number] DOB: [birth year] Today's Date: 01/24/2018    History of Present Illness Pt is a 32 y.o. female with sepsis, tachycardia, tachypnea, leukocytosis, hypotension.  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 PMH of polysubstance abuse, ADD, scoliosis sp back surgery (20 years ago per mom). PICC placed 12/08/17.    PT Comments    Pt performed gait training with in room without device and intermittent use of IV pole.  Goal of session focused on strengthening and balance activities.  Issued HEP and spoke with patient regarding recommendations after d/c home.  Pt reports she thought she was going to have HHPT.  Nursing reports she will call case management to speak with patient regarding d/c plans.  Pt expresses concern of need for transportation for appointments.   Follow Up Recommendations  Outpatient PT;Supervision for mobility/OOB     Equipment Recommendations  Rolling walker with 5" wheels;3in1 (PT);Cane    Recommendations for Other Services Rehab consult     Precautions / Restrictions Precautions Precautions: Back;Fall Precaution Booklet Issued: No Precaution Comments: cues during session for back precautions Restrictions Weight Bearing Restrictions: No    Mobility  Bed Mobility Overal bed mobility: Independent Bed Mobility: Sit to Supine;Sit to Sidelying Rolling: Supervision       Sit to sidelying: Supervision General bed mobility comments: Reviewed return to bed maintaining spinal precautions.    Transfers Overall transfer level: Needs assistance Equipment used: None Transfers: Sit to/from Stand Sit to Stand: Supervision         General transfer comment: Cues for hand placement to and from seated surface.   Pt remains guarded during transition and immediately reaches for IV pole for support.     Ambulation/Gait Ambulation/Gait assistance: Min guard;Min assist Gait Distance (Feet): 20 Feet Assistive device: None(gait with in room only with intermittent use of IV pole.  ) Gait Pattern/deviations: Step-through pattern;Trendelenburg;Drifts right/left;Narrow base of support Gait velocity: Decreased   General Gait Details: Pt heavily reliant on counter, IV pole and rails of bed.     Stairs             Wheelchair Mobility    Modified Rankin (Stroke Patients Only)       Balance Overall balance assessment: Needs assistance   Sitting balance-Leahy Scale: Good       Standing balance-Leahy Scale: Fair Standing balance comment: limited dynamic balance outside base of support                High Level Balance Comments: Performed modified SLS with eyes/open and eyes closed on each leg,  intermittent use of counter for support to right balance.  Standing stepping with weight shifting, forward, sideways and backwards.              Cognition Arousal/Alertness: Awake/alert Behavior During Therapy: WFL for tasks assessed/performed Overall Cognitive Status: Within Functional Limits for tasks assessed                                        Exercises General Exercises - Lower Extremity Ankle Circles/Pumps: AROM;Both;10 reps;Supine Quad Sets: AROM;Both;10 reps;Supine Heel Slides: AROM;Both;10 reps;Supine Hip ABduction/ADduction: AROM;Strengthening;Both;10 reps;Supine Heel Raises: AROM;Both;10 reps;Supine Mini-Sqauts: AROM;Both;10 reps;Supine    General Comments        Pertinent Vitals/Pain Pain Assessment: 0-10 Faces Pain Scale:  Hurts even more Pain Location: Lower back/L leg Pain Descriptors / Indicators: Sore Pain Intervention(s): Monitored during session;Repositioned    Home Living                      Prior Function            PT Goals (current goals can now be found in the care plan section) Acute Rehab PT  Goals Patient Stated Goal: to go home and get real food Potential to Achieve Goals: Good Progress towards PT goals: Progressing toward goals    Frequency    Min 3X/week      PT Plan Current plan remains appropriate    Co-evaluation              AM-PAC PT "6 Clicks" Daily Activity  Outcome Measure  Difficulty turning over in bed (including adjusting bedclothes, sheets and blankets)?: None Difficulty moving from lying on back to sitting on the side of the bed? : None Difficulty sitting down on and standing up from a chair with arms (e.g., wheelchair, bedside commode, etc,.)?: A Little Help needed moving to and from a bed to chair (including a wheelchair)?: A Little Help needed walking in hospital room?: A Little Help needed climbing 3-5 steps with a railing? : A Little 6 Click Score: 20    End of Session Equipment Utilized During Treatment: Gait belt Activity Tolerance: Patient tolerated treatment well Patient left: in bed;with call bell/phone within reach;Other (comment) Nurse Communication: Mobility status PT Visit Diagnosis: Other abnormalities of gait and mobility (R26.89);Pain Pain - Right/Left: Left Pain - part of body: Hip     Time: 0093-8182 PT Time Calculation (min) (ACUTE ONLY): 35 min  Charges:  $Therapeutic Exercise: 23-37 mins                     Governor Rooks, PTA Acute Rehabilitation Services Pager [phone] Office [phone]     Skyylar Kopf Eli Hose 01/24/2018, 3:24 PM

## 2018-01-25 NOTE — Plan of Care (Signed)
  Problem: Pain Managment: Goal: General experience of comfort will improve Outcome: Progressing   

## 2018-01-25 NOTE — Progress Notes (Signed)
PROGRESS NOTE    Kelly [name]  HQP:[id number] DOB: [birth year] DOA: 12/02/2017 PCP: Patient, No Pcp Per    Brief Narrative:  32 year old female who presented with muscle pains. She does havehistory of IV drug abuse and scoliosis. Ongoing symptoms for about 10 days, physical examination blood pressure 106/67, heart rate 115, temperature 98.2, respiratory rate 24. Moist mucous membranes, lungs clear to auscultation bilaterally, heart S1-S2 present and rhythmic, abdomen soft nontender, no lower extremity edema. Sodium 130, potassium 3.6, chloride 90, bicarb 27, glucose 113, BUN 23, creatinine 1.0, AST 13, ALT 6, white count 28.3, hemoglobin 9.5, hematocrit 20.7, platelets 402.Chest x-ray with right-baseatelectasis. Hardware in thoracic spine. Urine analysis specific gravity 1.014, 30 protein, white cells 21-50, RBCs greater than 50. CT chest with multiple focal consolidation both lungs including multiple cavitary lesions suggesting septic emboli.  Patient was admitted to the hospitalwith theworking diagnosis of sepsis.  Work-up with echocardiography revealed tricuspid valve endocarditis, cultures positive for MSSA.   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 Continue IVcefazolin.Plan for MRI of thelumbar spine on 09/16, and then echocardiogram before discharge.  2,  AKI(resolved) with hyponatremia.resolved  3. Polysubstance abuse.Continue methadone, pain is well controlled.  4. Scoliosis.Out of bed..  5. Adrenal insuficiency.Plan for cosyntropin stimulation test before discharge.  DVT prophylaxis:enoxaparin Code Status:full Family Communication:no family at the bedside Disposition Plan/ discharge barriers:pending completion of IV antibiotic therapy, September 18.   Consultants:  ID  Procedures:    Antimicrobials: Cefazolin IV   Subjective: Patient had difficulty sleeping last night, no nausea or vomiting, no chest pain or dyspnea.   Objective: Vitals:   01/24/18 1456 01/24/18 1507 01/24/18 2027 01/25/18 0624  BP: 120/80 127/80 113/87 114/78  Pulse: (!) 105 92 79 82  Resp: 18 16 18 17   Temp: (!) 97.5 F (36.4 C) 98.9 F (37.2 C) 97.9 F (36.6 C) 97.6 F (36.4 C)  TempSrc: Oral Oral Oral Oral  SpO2: 98% 93% 99% 100%  Weight:      Height:        Intake/Output Summary (Last 24 hours) at 01/25/2018 1227 Last data filed at 01/25/2018 1031 Gross per 24 hour  Intake 3561.18 ml  Output -  Net 3561.18 ml   Filed Weights   01/11/18 0500 01/12/18 0550 01/16/18 0556  Weight: 82.1 kg 83 kg 84 kg    Examination:   General: Not in pain or dyspnea, deconditioned  Neurology: Awake and alert, non focal  E ENT: mild pallor, no icterus, oral mucosa moist Cardiovascular: No JVD. S1-S2 present, rhythmic, no gallops, rubs, or murmurs. No lower extremity edema. Pulmonary: vesicular breath sounds bilaterally, adequate air movement, no wheezing, rhonchi or rales. Gastrointestinal. Abdomen with no organomegaly, non tender, no rebound or guarding Skin. No rashes Musculoskeletal: no joint deformities     Data Reviewed: I have personally reviewed following labs and imaging studies  CBC: Recent Labs  Lab  01/20/18 0348  WBC 6.9  HGB 10.2*  HCT 33.4*  MCV 93.0  PLT 979   Basic Metabolic Panel: Recent  Labs  Lab 01/20/18 0348  NA 138  K 4.0  CL 101  CO2 27  GLUCOSE 103*  BUN 20  CREATININE 0.66  CALCIUM 9.3   GFR: Estimated Creatinine Clearance: 111.5 mL/min (by C-G formula based on SCr of 0.66 mg/dL). Liver Function Tests: No results for input(s): AST, ALT, ALKPHOS, BILITOT, PROT, ALBUMIN in the last 168 hours. No results for input(s): LIPASE, AMYLASE in the last 168 hours. No results for input(s): AMMONIA in the last 168 hours. Coagulation Profile: No results for input(s): INR, PROTIME in the last 168 hours. Cardiac Enzymes: No results for input(s): CKTOTAL, CKMB, CKMBINDEX, TROPONINI in the last 168 hours. BNP (last 3 results) No results for input(s): PROBNP in the last 8760 hours. HbA1C: No results for input(s): HGBA1C in the last 72 hours. CBG: No results for input(s): GLUCAP in the last 168 hours. Lipid Profile: No results for input(s): CHOL, HDL, LDLCALC, TRIG, CHOLHDL, LDLDIRECT in the last 72 hours. Thyroid Function Tests: No results for input(s): TSH, T4TOTAL, FREET4, T3FREE, THYROIDAB in the last 72 hours. Anemia Panel: No results for input(s): VITAMINB12, FOLATE, FERRITIN, TIBC, IRON, RETICCTPCT in the last 72 hours.    Radiology Studies: I have reviewed all of the imaging during this hospital visit personally     Scheduled Meds: . acetaminophen  1,000 mg Oral Q8H  . bupivacaine liposome  20 mL Infiltration Once  . bupivacaine-EPINEPHrine  50 mL Infiltration Once  . cyclobenzaprine  10 mg Oral TID  . ferrous sulfate  325 mg Oral Q breakfast  . heparin  5,000 Units Subcutaneous Q8H  . lactobacillus  1 g Oral TID WC  . magnesium oxide  400 mg Oral BID  . methadone  60 mg Oral Daily  . pantoprazole  40 mg Oral Daily  . polyethylene glycol  17 g Oral BID  . psyllium  1 packet Oral Daily  . senna-docusate  1 tablet Oral BID  . sodium chloride flush  10-40 mL Intracatheter Q12H  . tamsulosin  0.4 mg Oral Daily   Continuous Infusions: .  ceFAZolin  (ANCEF) IV 2 g (01/25/18 4801)  . lactated ringers 10 mL/hr at 01/23/18 6553     LOS: 45 days        Alexxus Sobh Gerome Apley, MD [facility] Pager [phone]

## 2018-01-26 MED ORDER — SODIUM CHLORIDE 0.9 % IV SOLN
INTRAVENOUS | Status: DC | PRN
  Administered 2018-01-26: 250 mL via INTRAVENOUS

## 2018-01-26 MED ORDER — COSYNTROPIN 0.25 MG IJ SOLR
0.2500 mg | Freq: Once | INTRAMUSCULAR | Status: AC
  Administered 2018-01-27: 0.25 mg via INTRAVENOUS
  Filled 2018-01-26: qty 0.25

## 2018-01-26 MED ORDER — HYDROMORPHONE HCL 1 MG/ML IJ SOLN
1.0000 mg | Freq: Once | INTRAMUSCULAR | Status: AC | PRN
  Administered 2018-01-28: 1 mg via INTRAVENOUS
  Filled 2018-01-26: qty 1

## 2018-01-26 NOTE — Progress Notes (Addendum)
PROGRESS NOTE    Kelly [name]  GYI:[id number] DOB: 1986-02-26 DOA: 12/02/2017 PCP: Patient, No Pcp Per    Brief Narrative:  32 year old female who presented with muscle pains. She does havehistory of IV drug abuse and scoliosis. Ongoing symptoms for about 10 days, physical examination blood pressure 106/67, heart rate 115, temperature 98.2, respiratory rate 24. Moist mucous membranes, lungs clear to auscultation bilaterally, heart S1-S2 present and rhythmic, abdomen soft nontender, no lower extremity edema. Sodium 130, potassium 3.6, chloride 90, bicarb 27, glucose 113, BUN 23, creatinine 1.0, AST 13, ALT 6, white count 28.3, hemoglobin 9.5, hematocrit 20.7, platelets 402.Chest x-ray with right-baseatelectasis. Hardware in thoracic spine. Urine analysis specific gravity 1.014, 30 protein, white cells 21-50, RBCs greater than 50. CT chest with multiple focal consolidation both lungs including multiple cavitary lesions suggesting septic emboli.  Patient was admitted to the hospitalwith theworking diagnosis of sepsis.  Her blood cultures tested positive for Staphylococcus aureus, MSSA.  Further work-up with echocardiography showed a 1.7 x 1.1 cm vegetation on the septal leaflet of the tricuspid valve with severe tricuspid regurgitation.  Antibiotics were changed to cefazolin.   Patient reported severe back pain, lower extremity weakness and having difficulty ambulating, further work-up with MRI showed lumbar epidural abscess. 12/05/2017 Patient underwent L3, L4, L5 and S1 laminectomy for drainage of lumbar epidural abscess.  Patient had hyperkalemia and hyponatremia, her cortisol was low, ACTH stimulation test went from 2.7 up to 15.5, patient was placed on steroids, MRI of the pituitary shows small 1 mm adenoma.  Prolactin level was normal.  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 On IVcefazolin. MRI of thelumbar spine in am. Will need echocardiogram before discharge.  2, AKI(resolved) with hyponatremia.clinically resolved  3. Polysubstance abuse.On methadone, pain is well controlled, will follow as outpatient.   4. Scoliosis.Out of bed. Pain is well controlled  5. Adrenal insuficiency. Cosyntropin stimulation test before discharge. 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   DVT prophylaxis:enoxaparin Code Status:full Family Communication:no family at the bedside Disposition Plan/ discharge barriers:pending completion of IV antibiotic therapy, September 18.   Consultants:  ID  Procedures:    Antimicrobials: Cefazolin IV  Subjective: Patient feeling well, no diarrhea, back pain is well controlled, has been out of bed, no nausea or vomiting.   Objective: Vitals:   01/25/18 0624 01/25/18 1328 01/25/18 2048 01/26/18 0524  BP: 114/78 111/75 113/76 106/72  Pulse: 82 74 80 81  Resp: 17 18 18 20   Temp: 97.6 F (36.4 C) 98.1 F (36.7 C) 98 F (36.7 C) 97.6 F (36.4 C)  TempSrc: Oral Oral Oral Oral  SpO2: 100% 96% 99% 99%  Weight:    86.1 kg  Height:        Intake/Output Summary (Last 24 hours) at 01/26/2018 1056 Last data filed at 01/26/2018 0647 Gross per 24 hour  Intake 660 ml  Output -  Net  660 ml   Filed Weights   01/12/18 0550 01/16/18 0556 01/26/18 0524  Weight:  83 kg 84 kg 86.1 kg    Examination:   General: Not in pain or dyspnea.  Neurology: Awake and alert, non focal  E ENT: no pallor, no icterus, oral mucosa moist Cardiovascular: No JVD. S1-S2 present, rhythmic, no gallops, rubs, or murmurs. No lower extremity edema. Pulmonary: vesicular breath sounds bilaterally, adequate air movement, no wheezing, rhonchi or rales. Gastrointestinal. Abdomen with no organomegaly, non tender, no rebound or guarding Skin. No rashes Musculoskeletal: no joint deformities     Data Reviewed: I have personally reviewed following labs and imaging studies  CBC: Recent Labs  Lab 01/20/18 0348  WBC 6.9  HGB 10.2*  HCT 33.4*  MCV 93.0  PLT 940   Basic Metabolic Panel: Recent Labs  Lab 01/20/18 0348  NA 138  K 4.0  CL 101  CO2 27  GLUCOSE 103*  BUN 20  CREATININE 0.66  CALCIUM 9.3   GFR: Estimated Creatinine Clearance: 112.7 mL/min (by C-G formula based on SCr of 0.66 mg/dL). Liver Function Tests: No results for input(s): AST, ALT, ALKPHOS, BILITOT, PROT, ALBUMIN in the last 168 hours. No results for input(s): LIPASE, AMYLASE in the last 168 hours. No results for input(s): AMMONIA in the last 168 hours. Coagulation Profile: No results for input(s): INR, PROTIME in the last 168 hours. Cardiac Enzymes: No results for input(s): CKTOTAL, CKMB, CKMBINDEX, TROPONINI in the last 168 hours. BNP (last 3 results) No results for input(s): PROBNP in the last 8760 hours. HbA1C: No results for input(s): HGBA1C in the last 72 hours. CBG: No results for input(s): GLUCAP in the last 168 hours. Lipid Profile: No results for input(s): CHOL, HDL, LDLCALC, TRIG, CHOLHDL, LDLDIRECT in the last 72 hours. Thyroid Function Tests: No results for input(s): TSH, T4TOTAL, FREET4, T3FREE, THYROIDAB in the last 72 hours. Anemia Panel: No results for input(s): VITAMINB12, FOLATE, FERRITIN, TIBC, IRON, RETICCTPCT in the last 72 hours.    Radiology Studies: I have  reviewed all of the imaging during this hospital visit personally     Scheduled Meds: . acetaminophen  1,000 mg Oral Q8H  . bupivacaine liposome  20 mL Infiltration Once  . bupivacaine-EPINEPHrine  50 mL Infiltration Once  . cyclobenzaprine  10 mg Oral TID  . ferrous sulfate  325 mg Oral Q breakfast  . heparin  5,000 Units Subcutaneous Q8H  . lactobacillus  1 g Oral TID WC  . magnesium oxide  400 mg Oral BID  . methadone  60 mg Oral Daily  . pantoprazole  40 mg Oral Daily  . polyethylene glycol  17 g Oral BID  . psyllium  1 packet Oral Daily  . senna-docusate  1 tablet Oral BID  . sodium chloride flush  10-40 mL Intracatheter Q12H  . tamsulosin  0.4 mg Oral Daily   Continuous Infusions: .  ceFAZolin (ANCEF) IV Stopped (01/26/18 7680)  . lactated ringers 10 mL/hr at 01/23/18 8811     LOS: 57 days        Kala Ambriz Gerome Apley, MD [facility] Pager [phone]

## 2018-01-27 ENCOUNTER — Other Ambulatory Visit (HOSPITAL_COMMUNITY): Payer: Self-pay

## 2018-01-27 LAB — BASIC METABOLIC PANEL
Anion gap: 6 (ref 5–15)
BUN: 20 mg/dL (ref 6–20)
CALCIUM: 9.2 mg/dL (ref 8.9–10.3)
CO2: 28 mmol/L (ref 22–32)
CREATININE: 0.7 mg/dL (ref 0.44–1.00)
Chloride: 102 mmol/L (ref 98–111)
GFR calc Af Amer: >60 mL/min (ref >60)
GFR calc non Af Amer: >60 mL/min (ref >60)
Glucose, Bld: 113 mg/dL — ABNORMAL HIGH (ref 70–99)
Potassium: 4.1 mmol/L (ref 3.5–5.1)
Sodium: 136 mmol/L (ref 135–145)

## 2018-01-27 LAB — ACTH STIMULATION, 3 TIME POINTS
Cortisol, 30 Min: 7.5 ug/dL
Cortisol, 60 Min: 8.5 ug/dL
Cortisol, Base: 1.9 ug/dL

## 2018-01-27 NOTE — Care Management Note (Addendum)
Case Management Note  Patient Details  Name: BLESS LISENBY MRN: [id number] Date of Birth: 1985/08/24  Subjective/Objective:                    Action/Plan:  Discussed outpatient PT with patient at bedside. Patient prefers outpatient PT at [facility] location.   Patient plans to discharge to her [place] home : [address], [place] , [place] [address]  Her cell phone is turned off, best number to reach her at is her mother's [facility])  cell [phone].  Patient's PCP is [employer] [phone].   Outpatient PT ordered at [facility] location. Expected Discharge Date:  (unknown)               Expected Discharge Plan:     In-House Referral:  Clinical Social Work  Discharge planning Services  CM Consult  Post Acute Care Choice:    Choice offered to:  Patient  DME Arranged:  N/A DME Agency:  NA  HH Arranged:  NA HH Agency:  NA  Status of Service:  In process, will continue to follow  If discussed at Long Length of Stay Meetings, dates discussed:    Additional Comments:  Marilu Favre, RN 01/27/2018, 11:04 AM

## 2018-01-27 NOTE — Progress Notes (Signed)
PROGRESS NOTE    Kelly [name]  GGE:[id number] DOB: Jun 09, 1985 DOA: 12/02/2017 PCP: Patient, No Pcp Per    Brief Narrative:  32 year old female who presented with muscle pains. She does havehistory of IV drug abuse and scoliosis. Ongoing symptoms for about 10 days, physical examination blood pressure 106/67, heart rate 115, temperature 98.2, respiratory rate 24. Moist mucous membranes, lungs clear to auscultation bilaterally, heart S1-S2 present and rhythmic, abdomen soft nontender, no lower extremity edema. Sodium 130, potassium 3.6, chloride 90, bicarb 27, glucose 113, BUN 23, creatinine 1.0, AST 13, ALT 6, white count 28.3, hemoglobin 9.5, hematocrit 20.7, platelets 402.Chest x-ray with right-baseatelectasis. Hardware in thoracic spine. Urine analysis specific gravity 1.014, 30 protein, white cells 21-50, RBCs greater than 50. CT chest with multiple focal consolidation both lungs including multiple cavitary lesions suggesting septic emboli.  Patient was admitted to the hospitalwith theworking diagnosis of sepsis.  Her blood cultures tested positive for Staphylococcus aureus, MSSA.  Further work-up with echocardiography showed a 1.7 x 1.1 cm vegetation on the septal leaflet of the tricuspid valve with severe tricuspid regurgitation.  Antibiotics were changed to cefazolin.   Patient reported severe back pain, lower extremity weakness and having difficulty ambulating, further work-up with MRI showed lumbar epidural abscess. 12/05/2017 Patient underwent L3, L4, L5 and S1 laminectomy for drainage of lumbar epidural abscess.  Patient had hyperkalemia and hyponatremia, her cortisol was low, ACTH stimulation test went from 2.7 up to 15.5, patient was placed on steroids, MRI of the pituitary shows small 1 mm adenoma.  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   2, AKI(resolved) with hyponatremia. Renal function preserved, electrolytes have normalized with K at 4.1 and Na at 136.   3. Polysubstance abuse.Continue methadone, will follow as outpatient to continue methadone therapy.   4. Scoliosis.Pain is well controlled.   5. Adrenal insufficiency. (transitory). Patient has been off prednisone for 3 days, clinically has remained asymptomatic, electrolytes within normal limits. ACTH stimulation test with low base cortisol, responding with elevation of 7 point at 60 minutes. Likely patient will need close follow up as outpatient, will continue to hold on prednisone for now.   6. Pituitary microadenoma. One mm, with normal prolactin, likely incidental finding.   DVT prophylaxis:enoxaparin Code Status:full Family Communication:no family at the bedside Disposition Plan/ discharge barriers:pending completion of IV antibiotic therapy, September 18.   Consultants:  ID  Procedures:    Antimicrobials: Cefazolin IV  Subjective: Patient is feeling well, no nausea or vomiting, no chest pain or dyspnea. Her back pain is well controlled.   Objective: Vitals:   01/26/18 0524 01/26/18 1401 01/26/18 2102 01/27/18 0617  BP: 106/72 112/86 107/74 106/71  Pulse: 81 (!) 107 88 82    Resp: 20  18 18  Temp: 97.6 F (36.4 C) 98.5 F (36.9 C) 97.8 F (36.6 C) (!) 97.5 F (36.4 C)  TempSrc: Oral Oral Oral Oral  SpO2: 99% 99% 99% 98%  Weight: 86.1 kg     Height:        Intake/Output Summary (Last 24 hours) at 01/27/2018 0901 Last data filed at 01/27/2018 0300 Gross per 24 hour  Intake 1288.36 ml  Output -  Net 1288.36 ml   Filed Weights   01/12/18 0550 01/16/18 0556 01/26/18 0524  Weight: 83 kg 84 kg 86.1 kg    Examination:   General: Not in pain or dyspnea,  Neurology: Awake and alert, non focal  E ENT: no pallor, no icterus, oral mucosa moist Cardiovascular: No JVD. S1-S2 present, rhythmic, no gallops, rubs, or murmurs. No lower extremity edema. Pulmonary: vesicular breath sounds bilaterally, adequate air movement, no wheezing, rhonchi or rales. Gastrointestinal. Abdomen with no organomegaly, non tender, no rebound or guarding Skin. No rashes Musculoskeletal: no joint deformities     Data Reviewed: I have personally reviewed following labs and imaging studies  CBC: No results for input(s): WBC, NEUTROABS, HGB, HCT, MCV, PLT in the last 168 hours. Basic Metabolic Panel: Recent Labs  Lab 01/27/18 0335  NA 136  K 4.1  CL 102  CO2 28  GLUCOSE 113*  BUN 20  CREATININE 0.70  CALCIUM 9.2   GFR: Estimated Creatinine Clearance: 112.7 mL/min (by C-G formula based on SCr of 0.7 mg/dL). Liver Function Tests: No results for input(s): AST, ALT, ALKPHOS, BILITOT, PROT, ALBUMIN in the last 168 hours. No results for input(s): LIPASE, AMYLASE in the last 168 hours. No results for input(s): AMMONIA in the last 168 hours. Coagulation Profile: No results for input(s): INR, PROTIME in the last 168 hours. Cardiac Enzymes: No results for input(s): CKTOTAL, CKMB, CKMBINDEX, TROPONINI in the last 168 hours. BNP (last 3 results) No results for input(s): PROBNP in the last 8760 hours. HbA1C: No results for input(s): HGBA1C in the last 72 hours. CBG: No  results for input(s): GLUCAP in the last 168 hours. Lipid Profile: No results for input(s): CHOL, HDL, LDLCALC, TRIG, CHOLHDL, LDLDIRECT in the last 72 hours. Thyroid Function Tests: No results for input(s): TSH, T4TOTAL, FREET4, T3FREE, THYROIDAB in the last 72 hours. Anemia Panel: No results for input(s): VITAMINB12, FOLATE, FERRITIN, TIBC, IRON, RETICCTPCT in the last 72 hours.    Radiology Studies: I have reviewed all of the imaging during this hospital visit personally     Scheduled Meds: . acetaminophen  1,000 mg Oral Q8H  . bupivacaine liposome  20 mL Infiltration Once  . bupivacaine-EPINEPHrine  50 mL Infiltration Once  . cyclobenzaprine  10 mg Oral TID  . ferrous sulfate  325 mg Oral Q breakfast  . heparin  5,000 Units Subcutaneous Q8H  . lactobacillus  1 g Oral TID WC  . magnesium oxide  400 mg Oral BID  . methadone  60 mg Oral Daily  . pantoprazole  40 mg Oral Daily  . polyethylene glycol  17 g Oral BID  . psyllium  1 packet Oral Daily  . senna-docusate  1 tablet Oral BID  . sodium chloride flush  10-40 mL Intracatheter Q12H  . tamsulosin  0.4 mg Oral Daily   Continuous Infusions: . sodium chloride 10 mL/hr at 01/27/18 0300  .  ceFAZolin (ANCEF) IV 2 g (01/27/18 5456)  . lactated ringers 10 mL/hr at 01/23/18 0808     LOS: 56 days  Mauricio Gerome Apley, MD [facility] Pager [phone]

## 2018-01-27 NOTE — Social Work (Signed)
CSW met with pt at bedside, she had spoken with RN Case Manager regarding therapy needs at discharge. Pt prefers outpatient PT. Pt will discharge home with her aunt in [place]. Pt states that she needs assistance with transportation to methadone appointments at [facility] and to get to PT. She has already spoken with Medicaid and left a message with her "Medicaid social worker." CSW provided information regarding Medicaid transportation and how to best contact the office in order to start a transportation assessment. Pt voices understanding and states she will call before she leave to try and do assessment.   Pt and CSW discussed discharge home and emotions that may come along with the end of a prolonged hospitalization. Pt acknowledges feelings of happiness as well as feeling nervous. CSW validated both as understandable and encouraged pt to reach out to CSW prior to discharge if she needs further support.   CSW signing off. Please consult if any additional needs arise.  [place], [place] Work [phone]

## 2018-01-28 ENCOUNTER — Inpatient Hospital Stay (HOSPITAL_COMMUNITY): Payer: Medicaid Other

## 2018-01-28 ENCOUNTER — Inpatient Hospital Stay (HOSPITAL_BASED_OUTPATIENT_CLINIC_OR_DEPARTMENT_OTHER): Payer: Medicaid Other

## 2018-01-28 DIAGNOSIS — I361 Nonrheumatic tricuspid (valve) insufficiency: Secondary | ICD-10-CM | POA: Diagnosis not present

## 2018-01-28 LAB — ECHOCARDIOGRAM COMPLETE
HEIGHTINCHES: 67 in
WEIGHTICAEL: 3079.39 [oz_av]

## 2018-01-28 MED ORDER — GADOBUTROL 1 MMOL/ML IV SOLN
8.5000 mL | Freq: Once | INTRAVENOUS | Status: AC | PRN
  Administered 2018-01-28: 8.5 mL via INTRAVENOUS

## 2018-01-28 NOTE — Progress Notes (Signed)
Physical Therapy Treatment Patient Details Name: Kelly [name] MRN: [id number] DOB: 06-Jul-1985 Today's Date: 01/28/2018    History of Present Illness Pt is a 32 y.o. female with sepsis, tachycardia, tachypnea, leukocytosis, hypotension.  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 PMH of polysubstance abuse, ADD, scoliosis sp back surgery (20 years ago per mom). PICC placed 12/08/17.    PT Comments    Pt progressing towards goals. Practiced gait with use of RW and cane with focus on improving gait mechanics. Also practiced steps with RW and single rail and cane, as pt unsure if she will have rail at her aunts home. Required min guard to supervision assist for mobility. Current recommendations appropriate. Will continue to follow acutely to maximize functional mobility independence and safety.    Follow Up Recommendations  Outpatient PT;Supervision for mobility/OOB     Equipment Recommendations  Rolling walker with 5" wheels;3in1 (PT);Cane    Recommendations for Other Services       Precautions / Restrictions Precautions Precautions: Back Precaution Booklet Issued: No Restrictions Weight Bearing Restrictions: No    Mobility  Bed Mobility Overal bed mobility: Independent                Transfers Overall transfer level: Needs assistance Equipment used: Rolling walker (2 wheeled) Transfers: Sit to/from Stand Sit to Stand: Supervision         General transfer comment: Supervision for safety.   Ambulation/Gait Ambulation/Gait assistance: Min guard;Supervision Gait Distance (Feet): 200 Feet Assistive device: Rolling walker (2 wheeled);Straight cane Gait Pattern/deviations: Step-through pattern;Trendelenburg;Drifts right/left;Narrow base of support Gait velocity: Decreased   General Gait Details: Verbal cues for heel strike during gait on LLE. One LOB with cane with use of fatigue  requiring min guard for safety. Gait with RW for 150' and gait with cane for 150'.    Stairs Stairs: Yes Stairs assistance: Min guard Stair Management: Backwards;Forwards;With walker;With cane;One rail Left;Step to pattern Number of Stairs: 7(4 with walker; 3 with rail and cane) General stair comments: Practiced steps with use of RW and cane and rail, as pt unsure if she will have rails at her aunts house. Min guard for safety and cues for sequencing. Gave handout for stair management with use of RW for home.    Wheelchair Mobility    Modified Rankin (Stroke Patients Only)       Balance Overall balance assessment: Needs assistance Sitting-balance support: No upper extremity supported;Feet supported Sitting balance-Leahy Scale: Good     Standing balance support: No upper extremity supported;Bilateral upper extremity supported Standing balance-Leahy Scale: Fair Standing balance comment: able to perform static standing at sink following toileting.                             Cognition Arousal/Alertness: Awake/alert Behavior During Therapy: WFL for tasks assessed/performed Overall Cognitive Status: Within Functional Limits for tasks assessed                                        Exercises General Exercises - Lower Extremity Hip ABduction/ADduction: AROM;Both;10 reps(standing at counter ) Mini-Sqauts: AROM;Both;10 reps;Other (comment)(with RW )    General Comments        Pertinent Vitals/Pain Pain Assessment: Faces Faces Pain Scale: Hurts even more Pain Location: Lower back/L leg Pain Descriptors /  Indicators: Sore Pain Intervention(s): Limited activity within patient's tolerance;Monitored during session;Repositioned    Home Living                      Prior Function            PT Goals (current goals can now be found in the care plan section) Acute Rehab PT Goals Patient Stated Goal: "to go to my son's soccer game this  weekend"  PT Goal Formulation: With patient Time For Goal Achievement: 01/31/18 Potential to Achieve Goals: Good Progress towards PT goals: Progressing toward goals    Frequency    Min 3X/week      PT Plan Current plan remains appropriate    Co-evaluation              AM-PAC PT "6 Clicks" Daily Activity  Outcome Measure  Difficulty turning over in bed (including adjusting bedclothes, sheets and blankets)?: None Difficulty moving from lying on back to sitting on the side of the bed? : None Difficulty sitting down on and standing up from a chair with arms (e.g., wheelchair, bedside commode, etc,.)?: A Little Help needed moving to and from a bed to chair (including a wheelchair)?: A Little Help needed walking in hospital room?: A Little Help needed climbing 3-5 steps with a railing? : A Little 6 Click Score: 20    End of Session Equipment Utilized During Treatment: Gait belt Activity Tolerance: Patient tolerated treatment well Patient left: in bed;with call bell/phone within reach(sitting EOB ) Nurse Communication: Mobility status PT Visit Diagnosis: Other abnormalities of gait and mobility (R26.89);Pain Pain - Right/Left: Left Pain - part of body: Hip     Time: 1137-1204 PT Time Calculation (min) (ACUTE ONLY): 27 min  Charges:  $Gait Training: 23-37 mins                     Leighton Ruff, PT, DPT  Acute Rehabilitation Services  Pager: [phone] Office: [phone]    Rudean Hitt 01/28/2018, 1:20 PM

## 2018-01-28 NOTE — Progress Notes (Signed)
   01/28/18 1100  Clinical Encounter Type  Visited With Patient  Visit Type Follow-up   Pt initially not in room, returned from a test when about to leave a note.  Briefly spoke w/ pt to f/u, but she was about to get ready to take a shower.  Pt asked if I could return in about 30 mins, said I would try my best, barring a situation arising.  [place] resident, [phone]

## 2018-01-28 NOTE — Progress Notes (Signed)
   01/28/18 1200  Clinical Encounter Type  Visited With Patient not available (w/ PT)  Visit Type Follow-up  [place] resident, [phone]

## 2018-01-28 NOTE — Progress Notes (Signed)
PROGRESS NOTE    Kelly [name]  WVP:[id number] DOB: 1985/09/18 DOA: 12/02/2017 PCP: Patient, No Pcp Per    Brief Narrative:  32 year old female who presented with muscle pains. She does havehistory of IV drug abuse and scoliosis. Ongoing symptoms for about 10 days, physical examination blood pressure 106/67, heart rate 115, temperature 98.2, respiratory rate 24. Moist mucous membranes, lungs clear to auscultation bilaterally, heart S1-S2 present and rhythmic, abdomen soft nontender, no lower extremity edema. Sodium 130, potassium 3.6, chloride 90, bicarb 27, glucose 113, BUN 23, creatinine 1.0, AST 13, ALT 6, white count 28.3, hemoglobin 9.5, hematocrit 20.7, platelets 402.Chest x-ray with right-baseatelectasis. Hardware in thoracic spine. Urine analysis specific gravity 1.014, 30 protein, white cells 21-50, RBCs greater than 50. CT chest with multiple focal consolidation both lungs including multiple cavitary lesions suggesting septic emboli.  Patient was admitted to the hospitalwith theworking diagnosis of sepsis.  Her blood cultures tested positive for Staphylococcus aureus, MSSA. Further work-up with echocardiography showed a 1.7 x 1.1 cm vegetation on the septal leaflet of the tricuspid valve with severe tricuspid regurgitation.Antibiotics were changed to cefazolin.  Patient reported severe back pain,lower extremity weaknessandhaving difficulty ambulating, further work-up with MRI showed lumbar epidural abscess. 07/25/2019Patient underwent L3, L4, L5 and S1 laminectomy for drainage of lumbar epidural abscess.  Patient had hyperkalemia and hyponatremia, her cortisol was low, ACTH stimulation test went from 2.7 up to 15.5, patient was placed on steroids, MRI of the pituitary shows small 1 mm adenoma. 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 Follow up echocardiography tricuspid valve vegetation that is smaller than 12/03/17, continue to have severe regurgitation. Lumbar spine MRI with no abscess. Will follow on final recommendations from ID, in regards of discharge oral antibiotic therapy and follow up. Patient will complete IV therapy 09/18.   2, AKI(resolved) with hyponatremia. Clinically resolved.   3. Polysubstance abuse.Onmethadone. Follow up as outpatient.  4. Scoliosis.Pain ambulating and out of bed, pain seems to be well controlled.   5. Adrenal insufficiency related to critical illness. (transitory). Patient off steroids, continue to be asymptomatic, electrolytes with normal ranges. Repeat ACTH stimulation test cortisol increase from 1,9 to 8,5. Sub-optimal response. Patient off prednisone since late last week.   6. Pituitary microadenoma. 1 mm on the right. Suspected incidental finding, prolactin level was normal.   DVT prophylaxis:enoxaparin Code Status:full Family Communication:no family at the bedside Disposition Plan/ discharge barriers:pending completion of IV antibiotic therapy, September 18.   Consultants:  ID  Procedures:    Antimicrobials: Cefazolin IV   Subjective: Patient is feeling well, no nausea or vomiting. No chest pain or dyspnea.   Objective: Vitals:   01/27/18 1334 01/27/18 2106 01/28/18  0500 01/28/18 0559  BP: (!) 111/96 125/88  102/76  Pulse: 96 88  70  Resp: 20 18  16   Temp: 98  F (36.7 C) 98.1 F (36.7 C)  97.8 F (36.6 C)  TempSrc: Oral Oral  Oral  SpO2: 99% 99%  98%  Weight:   87.3 kg   Height:        Intake/Output Summary (Last 24 hours) at 01/28/2018 1223 Last data filed at 01/28/2018 1110 Gross per 24 hour  Intake 4050.33 ml  Output -  Net 4050.33 ml   Filed Weights   01/16/18 0556 01/26/18 0524 01/28/18 0500  Weight: 84 kg 86.1 kg 87.3 kg    Examination:   General: Not in pain or dyspnea, deconditioned  Neurology: Awake and alert, non focal  E ENT: mild pallor, no icterus, oral mucosa moist Cardiovascular: No JVD. S1-S2 present, rhythmic, no gallops, rubs, or murmurs. No lower extremity edema. Pulmonary: positive breath sounds bilaterally, adequate air movement, no wheezing, rhonchi or rales. Gastrointestinal. Abdomen with no organomegaly, non tender, no rebound or guarding Skin. No rashes Musculoskeletal: no joint deformities     Data Reviewed: I have personally reviewed following labs and imaging studies  CBC: No results for input(s): WBC, NEUTROABS, HGB, HCT, MCV, PLT in the last 168 hours. Basic Metabolic Panel: Recent Labs  Lab 01/27/18 0335  NA 136  K 4.1  CL 102  CO2 28  GLUCOSE 113*  BUN 20  CREATININE 0.70  CALCIUM 9.2   GFR: Estimated Creatinine Clearance: 113.5 mL/min (by C-G formula based on SCr of 0.7 mg/dL). Liver Function Tests: No results for input(s): AST, ALT, ALKPHOS, BILITOT, PROT, ALBUMIN in the last 168 hours. No results for input(s): LIPASE, AMYLASE in the last 168 hours. No results for input(s): AMMONIA in the last 168 hours. Coagulation Profile: No results for input(s): INR, PROTIME in the last 168 hours. Cardiac Enzymes: No results for input(s): CKTOTAL, CKMB, CKMBINDEX, TROPONINI in the last 168 hours. BNP (last 3 results) No results for input(s): PROBNP in the last 8760 hours. HbA1C: No results  for input(s): HGBA1C in the last 72 hours. CBG: No results for input(s): GLUCAP in the last 168 hours. Lipid Profile: No results for input(s): CHOL, HDL, LDLCALC, TRIG, CHOLHDL, LDLDIRECT in the last 72 hours. Thyroid Function Tests: No results for input(s): TSH, T4TOTAL, FREET4, T3FREE, THYROIDAB in the last 72 hours. Anemia Panel: No results for input(s): VITAMINB12, FOLATE, FERRITIN, TIBC, IRON, RETICCTPCT in the last 72 hours.    Radiology Studies: I have reviewed all of the imaging during this hospital visit personally     Scheduled Meds: . acetaminophen  1,000 mg Oral Q8H  . bupivacaine liposome  20 mL Infiltration Once  . bupivacaine-EPINEPHrine  50 mL Infiltration Once  . cyclobenzaprine  10 mg Oral TID  . ferrous sulfate  325 mg Oral Q breakfast  . heparin  5,000 Units Subcutaneous Q8H  . lactobacillus  1 g Oral TID WC  . magnesium oxide  400 mg Oral BID  . methadone  60 mg Oral Daily  . pantoprazole  40 mg Oral Daily  . polyethylene glycol  17 g Oral BID  . psyllium  1 packet Oral Daily  . senna-docusate  1 tablet Oral BID  . sodium chloride flush  10-40 mL Intracatheter Q12H  . tamsulosin  0.4 mg Oral Daily   Continuous Infusions: . sodium chloride Stopped (01/27/18 2213)  .  ceFAZolin (ANCEF) IV 2 g (01/28/18 0321)  . lactated ringers 10 mL/hr at 01/23/18 2248     LOS: 74 days        Mauricio Gerome Apley, MD [facility] Pager [phone]

## 2018-01-28 NOTE — Progress Notes (Signed)
  Echocardiogram 2D Echocardiogram has been performed.  Kelly [name] 01/28/2018, 2:25 PM

## 2018-01-29 ENCOUNTER — Ambulatory Visit: Payer: Self-pay | Admitting: Cardiovascular Disease

## 2018-01-29 ENCOUNTER — Telehealth: Payer: Self-pay | Admitting: Cardiovascular Disease

## 2018-01-29 LAB — CULTURE, BLOOD (ROUTINE X 2)
CULTURE: NO GROWTH
CULTURE: NO GROWTH
Special Requests: ADEQUATE
Special Requests: ADEQUATE

## 2018-01-29 MED ORDER — CEPHALEXIN 500 MG PO CAPS
500.0000 mg | ORAL_CAPSULE | Freq: Two times a day (BID) | ORAL | Status: DC
  Administered 2018-01-30: 500 mg via ORAL
  Filled 2018-01-29: qty 1

## 2018-01-29 NOTE — Progress Notes (Signed)
INFECTIOUS DISEASE PROGRESS NOTE  ID: Kelly [name] is a 32 y.o. 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   Abtx:  Anti-infectives (From admission, onward)   Start     Dose/Rate Route Frequency Ordered Stop   12/06/17 2200  ceFAZolin (ANCEF) IVPB 2g/100 mL premix     2 g 200 mL/hr over 30 Minutes Intravenous Every 8 hours 12/06/17 1016 01/29/18 2359   12/06/17 0600  vancomycin (VANCOCIN) IVPB 1000 mg/200 mL premix  Status:  Discontinued     1,000 mg 200 mL/hr over 60 Minutes Intravenous Every 8 hours 12/05/17 2316 12/06/17 1200   12/06/17 0000  cefTRIAXone (ROCEPHIN) 2 g in sodium chloride 0.9 % 100 mL IVPB  Status:  Discontinued     2 g 200 mL/hr over 30 Minutes Intravenous Every 24 hours 12/05/17 2238 12/06/17 1016   12/05/17 2300  vancomycin (VANCOCIN) 1,750 mg in sodium chloride 0.9 % 500 mL IVPB     1,750 mg 250 mL/hr over 120 Minutes Intravenous  Once 12/05/17 2246 12/06/17 0228   12/05/17 2055  vancomycin (VANCOCIN) powder  Status:  Discontinued       As needed 12/05/17 2055 12/05/17 2118   12/05/17 2001  bacitracin 50,000 Units in sodium chloride 0.9 % 500 mL irrigation  Status:  Discontinued       As needed 12/05/17 2002 12/05/17 2118   12/03/17 1600  azithromycin (ZITHROMAX) 500 mg in sodium chloride 0.9 % 250 mL IVPB  Status:  Discontinued     500 mg 250 mL/hr over 60 Minutes Intravenous Every 24 hours 12/02/17 1706 12/03/17 0948   12/03/17 1400  ceFAZolin (ANCEF) IVPB 2g/100 mL premix  Status:  Discontinued     2 g 200 mL/hr  over 30 Minutes Intravenous Every 8 hours 12/03/17 0948 12/05/17 2238   12/03/17 1000  vancomycin (VANCOCIN) IVPB 750 mg/150 ml premix  Status:  Discontinued     750 mg 150 mL/hr over 60 Minutes Intravenous Every 12 hours 12/02/17 1710 12/03/17 0948   12/02/17 2200  piperacillin-tazobactam (ZOSYN) IVPB 3.375 g  Status:  Discontinued     3.375 g 12.5 mL/hr over 240 Minutes Intravenous Every 8 hours 12/02/17 1710 12/03/17 0948   12/02/17 1800  vancomycin (VANCOCIN) 1,500 mg in sodium chloride 0.9 % 500 mL IVPB     1,500 mg 250 mL/hr over 120 Minutes Intravenous  Once 12/02/17 1710 12/02/17 2110   12/02/17 1530  cefTRIAXone (ROCEPHIN) 1 g in sodium chloride 0.9 % 100 mL IVPB     1 g 200 mL/hr over 30 Minutes Intravenous  Once 12/02/17 1522 12/02/17 1626   12/02/17 1530  azithromycin (ZITHROMAX) 500 mg in sodium chloride 0.9 % 250 mL IVPB     500 mg 250 mL/hr over 60 Minutes Intravenous  Once 12/02/17 1522 12/02/17 1759      Medications:  Scheduled: . acetaminophen  1,000 mg Oral Q8H  . bupivacaine liposome  20 mL Infiltration Once  . bupivacaine-EPINEPHrine  50 mL Infiltration Once  . cyclobenzaprine  10  mg Oral TID  . ferrous sulfate  325 mg Oral Q breakfast  . heparin  5,000 Units Subcutaneous Q8H  . lactobacillus  1 g Oral TID WC  . magnesium oxide  400 mg Oral BID  . methadone  60 mg Oral Daily  . pantoprazole  40 mg Oral Daily  . polyethylene glycol  17 g Oral BID  . psyllium  1 packet Oral Daily  . senna-docusate  1 tablet Oral BID  . sodium chloride flush  10-40 mL Intracatheter Q12H  . tamsulosin  0.4 mg Oral Daily    Objective: Vital signs in last 24 hours: Temp:  [97.8 F (36.6 C)-98.1 F (36.7 C)] 97.8 F (36.6 C) (09/18 0508) Pulse Rate:  [73-89] 73 (09/18 0508) Resp:  [16-18] 16 (09/18 0508) BP: (103-114)/(63-75) 103/63 (09/18 0508) SpO2:  [98 %] 98 % (09/18 0508) Weight:  [87.1 kg] 87.1 kg (09/18 0500)   General appearance: alert, cooperative and no  distress Resp: clear to auscultation bilaterally Cardio: regular rate and rhythm GI: normal findings: bowel sounds normal and soft, non-tender  Lab Results Recent Labs    01/27/18 0335  NA 136  K 4.1  CL 102  CO2 28  BUN 20  CREATININE 0.70   Liver Panel No results for input(s): PROT, ALBUMIN, AST, ALT, ALKPHOS, BILITOT, BILIDIR, IBILI in the last 72 hours. Sedimentation Rate No results for input(s): ESRSEDRATE in the last 72 hours. C-Reactive Protein No results for input(s): CRP in the last 72 hours.  Microbiology: Recent Results (from the past 240 hour(s))  Culture, blood (routine x 2)     Status: None   Collection Time: 01/24/18 12:16 AM  Result Value Ref Range Status   Specimen Description BLOOD LEFT ANTECUBITAL  Final   Special Requests   Final    BOTTLES DRAWN AEROBIC ONLY Blood Culture adequate volume   Culture   Final    NO GROWTH 5 DAYS Performed at [facility], [address], [place], [place] [address]    Report Status 01/29/2018 FINAL  Final  Culture, blood (routine x 2)     Status: None   Collection Time: 01/24/18 12:27 AM  Result Value Ref Range Status   Specimen Description BLOOD LEFT HAND  Final   Special Requests   Final    BOTTLES DRAWN AEROBIC ONLY Blood Culture adequate volume   Culture   Final    NO GROWTH 5 DAYS Performed at [facility], [place] [address] [place], [place] [address]    Report Status 01/29/2018 FINAL  Final    Studies/Results: Mr Lumbar Spine W Wo Contrast  Result Date: 01/28/2018 CLINICAL DATA:  F/u scan to prev infection MSSA with abscess. Pt has improved with treatment able to ambulate with walker now. Hx scoliosis and surgery. Unable to obtain fs sag post contrast due to metal EXAM: MRI LUMBAR SPINE WITHOUT AND WITH CONTRAST TECHNIQUE: Multiplanar and multiecho pulse sequences of the lumbar spine were obtained without and with intravenous contrast. CONTRAST:  8.5 mL Gadavist COMPARISON:  01/10/2018, 12/24/2017,  12/05/2017 FINDINGS: Segmentation:  Standard. Alignment:  2 mm retrolisthesis of L5 on S1. Vertebrae: Vertebral body heights are maintained. Disc height loss at L5-S1 with fluid signal within the disc with endplate reactive edema and enhancement. No aggressive osseous lesion. Conus medullaris and cauda equina: Conus medullaris is obscured. Persistent enhancement of the cauda equina from L3 and caudally. No epidural fluid collection. Paraspinal and other soft tissues: Increased T2 signal within  the paraspinal musculature at the level of L4-5 likely related to denervation. Disc levels: T12 through L3 vertebral body and posterior elements as well as the central canal are obscured by orthopedic fixation hardware. L3-L4: Posterior decompression. No disc protrusion, foraminal stenosis or central canal stenosis. L4-L5: Posterior decompression. Small broad-based disc bulge. Mild foraminal stenosis. L5-S1: Posterior decompression. Prior discectomy. Broad-based disc bulge with epidural enhancement. No drainable fluid collection. Mild right and moderate left facet arthropathy with mild residual enhancement. Mild right and moderate left foraminal stenosis similar to the prior exam. IMPRESSION: 1. Stable appearance of disc edema at L5-S1 with reactive endplate changes likely related to recent discectomy. Persistent broad-based disc bulge. Persistent epidural enhancement consistent with inflammation/infection which is similar in appearance to the prior examination. No drainable epidural fluid collection to suggest an abscess. Postsurgical changes in the posterior paraspinal soft tissues from decompression. Bilateral facet arthropathy with mild residual enhancement similar to the prior examination. 2. Stable persistent enhancement of the cauda equina from L3 and caudally, which may be inflammatory or infectious. Electronically Signed   By: Kathreen Devoid   On: 01/28/2018 12:04     Assessment/Plan: MSSA bacteremia TV and MV  IE L spine discitis, osteo Opioid use d/o  Total days of antibiotics: end date 9-18 ancef   Repeat MRI noted. Would suggest prolonged po (6 months) kelfex 528m bid Repeat TTE noted- vegetation can persist for several months post treatment.   Consider repeat BCx in 1 week.  She will be d/c to Aunt's house.  She is on methadone Multiple questions answered for pt. Mother called as well.  She has f/u appt with ID and CV (will defer to them re: CVTS eval).  Available as needed.          JBobby RumpfMD, FACP Infectious Diseases (pager) [phone] 01/29/2018, 2:33 PM  LOS: 58 days

## 2018-01-29 NOTE — Progress Notes (Signed)
PROGRESS NOTE    Kelly [name]  BOF:[id number] DOB: 08-01-1985 DOA: 12/02/2017 PCP: Patient, No Pcp Per    Brief Narrative:  Kelly N Reynoldsis a 32 y.o.year old femalewith medical history significant for IV use, history of scoliosis status post back surgery  who presented on 7/22/2019with fevers, chills and was found to have tricuspid valve infectious endocarditis with pulmonary abscess and lumbar spine epidural abscess secondary to MSSA. Patient was found to have epidural abscess. Patient  underwent  laminectomy and drainage on 7-25. Patient has remained on IV antibiotics, Cefazolin, which she needs for 8 weeks with end date being 9/18.   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 - (HIV, Hepatitis C, AB, RPR negative ) -8 weeks of IV cefazolin--end date 9-18. -Labs done the last few times have been unremarkable. -Patient underwent echocardiogram on 9/17 which shows improvement in the tricuspid valve vegetation.  Patient has an appointment to see Dr. Gwenlyn Found on 10/1.  ESR down to 65 CRP 0.8 -Blood cultures repeated on 9/12 have been unremarkable. -Have requested infectious disease to provide final recommendation as the patient will complete treatment tonight.  Previous plan was for her to be initiated on Bactrim for 1 month time.  Patient also underwent MRI of the lumbar spine which shows stable findings. -Patient does have an  appointment with infectious disease in their office on 9/26.  Lumbar epidural abscess, S/P laminectomy and drainage on 7-25.  -MRI; on 8/14 showed persistent discitis possible epidural soft thickening, neurosurgery at that time recommended no further intervention.  -Urine retention resolved.  -Some chronic left LE weakness.  -Report numbness LE callf, some weakness.  -MRI 8-30; Status post L3-4 through L5-S1 posterior decompression without residual or recurrent epidural abscess. Small residual L5-S1 disc protrusion versus abscess improved from prior examination with decrease, mild potential L5-S1 discitis osteomyelitis. Persistent though improved abnormal enhancement of the cauda equina and intrathecal enhancement, possibly infectious. No canal stenosis. Persistent neural foraminal narrowing L4-5 and L5-S1: Moderate to severe on the LEFT at L5-S1. MRI lumbar spine repeated on 9/17 and shows stable findings.  Outpatient follow-up with Dr. Arnoldo Morale with neurosurgery.    Severe tricuspid regurgitation Echocardiogram repeated on 9/17 continues to show severe tricuspid regurgitation.  Decrease in size of agitation was noted.  Outpatient follow-up with cardiology scheduled for October 1, with Dr. Alvester Chou.    Adrenal insufficiency/Pituitary microadenoma ACTH stimulation test showed suboptimal response.  ACTH within normal range inappropriately. Concern for secondary adrenal insufficiency. Previous physician discussed with endocrinology (Dr. Buddy Duty) on 8/19 who recommended steroids and then follow-up as outpatient. 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  Patient was started on prednisone 5 mg daily.  Previous rounding physician discontinued patient's prednisone last week.  Patient's blood work has been stable.  Vital signs have been stable.  Would still recommend patient follow-up with Dr. Buddy Duty.  Patient and her mother are aware.  Since she has been stable  off of prednisone we will continue to  leave it off for now.  Retained needle fragment.  X-ray confirms 8 mm needle fragment anterior to proximal humerus (prior surgery 05/2016, for retained needle with associated infection). No signs or symptoms of acute infection at site.   This was discussed with with Silvestre Gunner (on 8/23) who agrees given asymptomatic, location (not in joint) no need for surgical intervention  Polysubstance abuse Patient and her mother have arranged for outpatient follow-up with addiction clinic.  Patient is currently on methadone.  Patient has contacted one of the outpatient clinics to continue to receive methadone in the outpatient setting.  She will be seen there on Friday 9/20.  Urinary retention and stool incontinence This has resolved.  Briefly required Foley for urinary retention which is now resolved.   Flomax was discontinued.  Constipation Continue bowel regimen.  Normocytic Anemia. Low folate. Hemoglobin is stable.  Folate is being repleted.  Left LE numbness Likely secondary to her spinal disease.  Stable to improving.  Continue to monitor for now.  No DVT noted on Doppler study.    DVT prophylaxis:  Subcutaneous heparin Code Status:  Full code Family Communication:  Discussed with the patient and her mother Disposition Plan:  Await final ID.  Hopefully discharge home tomorrow.   Consultants:  ID  Procedures:    Antimicrobials: Cefazolin IV   Subjective: Patient continues to feel well.  No nausea vomiting.  Seems to be anxious.  Objective: Vitals:   01/28/18 1540 01/28/18 2151 01/29/18 0500 01/29/18 0508  BP: 114/75 113/75  103/63  Pulse: 85 89  73  Resp: 18 16  16   Temp:  98.1 F (36.7 C)  97.8 F (36.6 C)  TempSrc:  Oral  Oral  SpO2: 98% 98%  98%  Weight:   87.1 kg   Height:        Intake/Output Summary (Last 24 hours) at 01/29/2018 1139 Last data filed at 01/29/2018 0534 Gross per 24 hour  Intake 680 ml  Output -  Net 680 ml   Filed  Weights   01/26/18 0524 01/28/18 0500 01/29/18 0500  Weight: 86.1 kg 87.3 kg 87.1 kg    Examination:   General: Alert.  No discomfort or distress. Cardiovascular: S1-S2 is normal regular.  No S3-S4.  No rubs.  Systolic murmur appreciated at the tricuspid area. Pulmonary: Normal effort at rest. Gastrointestinal.  Abdomen is soft nontender Neurologically she is alert and oriented x3.  No obvious focal neurological deficits.     Data Reviewed: I have personally reviewed following labs and imaging studies  Basic Metabolic Panel: Recent Labs  Lab 01/27/18 0335  NA 136  K 4.1  CL 102  CO2 28  GLUCOSE 113*  BUN 20  CREATININE 0.70  CALCIUM 9.2   GFR: Estimated Creatinine Clearance: 113.4 mL/min (by C-G formula based on SCr of 0.7 mg/dL).    Radiology Studies: I have reviewed all of the imaging during this hospital visit personally     Scheduled Meds: . acetaminophen  1,000 mg Oral Q8H  . bupivacaine liposome  20 mL Infiltration Once  . bupivacaine-EPINEPHrine  50 mL Infiltration Once  . cyclobenzaprine  10 mg Oral TID  . ferrous sulfate  325 mg Oral Q breakfast  . heparin  5,000 Units Subcutaneous Q8H  . lactobacillus  1 g Oral TID WC  . magnesium oxide  400 mg Oral BID  . methadone  60 mg Oral Daily  . pantoprazole  40 mg  Oral Daily  . polyethylene glycol  17 g Oral BID  . psyllium  1 packet Oral Daily  . senna-docusate  1 tablet Oral BID  . sodium chloride flush  10-40 mL Intracatheter Q12H  . tamsulosin  0.4 mg Oral Daily   Continuous Infusions: . sodium chloride Stopped (01/27/18 2213)  .  ceFAZolin (ANCEF) IV 2 g (01/29/18 0534)  . lactated ringers 10 mL/hr at 01/23/18 0808     LOS: 41 days    Bonnielee Haff, MD [facility] Pager [phone]

## 2018-01-29 NOTE — Telephone Encounter (Signed)
New Message:    Patient is calling about the ECHO results

## 2018-01-29 NOTE — Telephone Encounter (Signed)
Spoke with pt's mother. Adv her that I could not locate a signed ROI in the pt's chart and would not be able to discuss the pt's care.  She sts that she has signed one in the past. Adv her that I was unable to locate it. She sts that she understands. She sts that the pt is still admitted and she will call to talk with the rounding physician

## 2018-01-30 MED ORDER — FLORANEX PO PACK: 1.0000 g | PACK | Freq: Three times a day (TID) | ORAL | 0 refills | Status: DC

## 2018-01-30 MED ORDER — ACETAMINOPHEN 500 MG PO TABS: 1000.0000 mg | ORAL_TABLET | Freq: Three times a day (TID) | ORAL | 0 refills | Status: DC

## 2018-01-30 MED ORDER — CEPHALEXIN 500 MG PO CAPS: 500.0000 mg | ORAL_CAPSULE | Freq: Two times a day (BID) | ORAL | 1 refills | Status: DC

## 2018-01-30 MED ORDER — FERROUS SULFATE 325 (65 FE) MG PO TABS: 325.0000 mg | ORAL_TABLET | Freq: Every day | ORAL | 0 refills | Status: DC

## 2018-01-30 MED ORDER — NICOTINE POLACRILEX 2 MG MT GUM: 2.0000 mg | CHEWING_GUM | OROMUCOSAL | 0 refills | Status: DC | PRN

## 2018-01-30 MED ORDER — METHADONE HCL 10 MG/5ML PO SOLN: 60.0000 mg | Freq: Every day | ORAL | 0 refills | Status: DC

## 2018-01-30 MED ORDER — MAGNESIUM OXIDE 400 (241.3 MG) MG PO TABS: 400.0000 mg | ORAL_TABLET | Freq: Two times a day (BID) | ORAL | 0 refills | Status: DC

## 2018-01-30 MED ORDER — PANTOPRAZOLE SODIUM 40 MG PO TBEC: 40.0000 mg | DELAYED_RELEASE_TABLET | Freq: Every day | ORAL | 0 refills | Status: DC

## 2018-01-30 MED ORDER — NAPROXEN 500 MG PO TABS: 500.0000 mg | ORAL_TABLET | Freq: Two times a day (BID) | ORAL | 0 refills | Status: DC | PRN

## 2018-01-30 MED ORDER — OXYCODONE HCL 5 MG PO TABS: 5.0000 mg | ORAL_TABLET | ORAL | 0 refills | Status: DC | PRN

## 2018-01-30 MED ORDER — POLYETHYLENE GLYCOL 3350 17 G PO PACK: 17.0000 g | PACK | Freq: Two times a day (BID) | ORAL | 0 refills | Status: DC

## 2018-01-30 MED ORDER — CYCLOBENZAPRINE HCL 10 MG PO TABS: 10.0000 mg | ORAL_TABLET | Freq: Three times a day (TID) | ORAL | 0 refills | Status: DC

## 2018-01-30 MED ORDER — SENNOSIDES-DOCUSATE SODIUM 8.6-50 MG PO TABS: 1.0000 | ORAL_TABLET | Freq: Two times a day (BID) | ORAL | 0 refills | Status: DC

## 2018-01-30 MED ORDER — PSYLLIUM 95 % PO PACK: 1.0000 | PACK | Freq: Every day | ORAL | 0 refills | Status: DC

## 2018-01-30 MED FILL — CYCLOBENZAPRINE 10 MG TAB: 10 | 30 days supply | Qty: 90 | Fill #0

## 2018-01-30 MED FILL — NAPROXEN 500 MG TABLET: 500 | 30 days supply | Qty: 60 | Fill #0

## 2018-01-30 MED FILL — SM NICOTINE 2 MG CHEWING GU: 2 | 14 days supply | Qty: 110 | Fill #0

## 2018-01-30 MED FILL — CEPHALEXIN 500 MG CAPSULE: 500 | 30 days supply | Qty: 60 | Fill #0

## 2018-01-30 MED FILL — PANTOPRAZOLE SOD DR 40 MG T: 40 | 30 days supply | Qty: 30 | Fill #0

## 2018-01-30 NOTE — Discharge Summary (Signed)
[facility]  Physician Discharge Summary   Patient ID: Kelly [name] MRN: [id number] DOB/AGE: 1985/09/08 32 y.o.  Admit date: 12/02/2017 Discharge date: 01/30/2018  PCP: Patient has arranged for PCP.  Name unknown.  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 Patient to have close follow-up with infectious disease and cardiology.   2. She will also follow-up with endocrinology and neurosurgery.   DISCHARGE CONDITION: fair  Diet recommendation: Regular as tolerated  Filed Weights   01/26/18 0524 01/28/18 0500 01/29/18 0500  Weight: 86.1 kg 87.3 kg 87.1 kg    INITIAL HISTORY: Kelly N Reynoldsis a 32 y.o.year old femalewith medical history significant for IV use, history of scoliosis status post back surgery who presented on 7/22/2019with fevers, chills and was found to have tricuspid valve infectious endocarditis with pulmonary abscess and lumbar spine epidural abscess secondary to MSSA. Patient was found to have epidural abscess. Patient underwent laminectomy and drainage on 7-25. Patient has remained on IV antibiotics, Cefazolin, which she needs for 8 weeks with end date being 9/18.   HOSPITAL COURSE:   MSSA Bacteremia; tricuspid valve endocarditis; pulmonary abscess and lumbar spine epidural abscess -Patient with history of IV drug use. 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  She received 8 weeks of cefazolin.  She completed treatment on 9/18. -Patient underwent echocardiogram on 9/17 which shows improvement in the tricuspid valve vegetation.  Patient has an appointment to see Dr. Gwenlyn Found on 10/1. -Patient also underwent MRI of the lumbar spine which shows stable findings. -ESR down to 65 CRP 0.8 -Blood cultures repeated on 9/12 have been unremarkable. -Seen by infectious disease  again yesterday.  Recommendation is for twice a day Keflex for potentially 6 months but ID to determine this further outpatient follow-up.   -Patient does have an appointment with infectious disease in their office on 9/26.  Lumbar epidural abscess, S/P laminectomy and drainage on 7-25.  -MRI; on 8/14 showed persistent discitis possible epidural soft thickening, neurosurgery at that time recommended no further intervention.  -Urine retention resolved.  -Some chronic left LE weakness.  -MRI 8-30; Status post L3-4 through L5-S1 posterior decompression without residual or recurrent epidural abscess. Small residual L5-S1 disc protrusion versus abscess improved from prior examination with decrease, mild potential L5-S1 discitis osteomyelitis. Persistent though improved abnormal enhancement of the cauda equina and intrathecal enhancement, possibly infectious. No canal stenosis. Persistent neural foraminal narrowing L4-5 and L5-S1: Moderate to severe on the LEFT at L5-S1. -MRI lumbar spine repeated on 9/17 and shows stable findings.  Outpatient follow-up with Dr. Arnoldo Morale with neurosurgery.    Severe tricuspid regurgitation Echocardiogram repeated on 9/17 continues to show severe tricuspid regurgitation.  Decrease in size of vegetation was noted.  Outpatient follow-up with cardiology scheduled for October 1, with Dr. Gwenlyn Found.    Adrenal insufficiency/Pituitary microadenoma ACTH stimulation test showed suboptimal response. ACTH within normal range inappropriately. Concern for secondary adrenal insufficiency. Previous physician discussed with endocrinology (Dr. Buddy Duty) on 8/19 who recommended steroids and then follow-up as outpatient. 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 Patient was started on prednisone 5 mg daily.  Previous rounding physician, Dr. Cathlean Sauer, decided to discontinue patient's prednisone last week due to clinical stability.  Patient's blood work has been stable.  Vital signs have  been stable.  Would still recommend patient follow-up with Dr. Buddy Duty.  Patient and her mother are aware.  Since she has been stable off of prednisone we will continue to leave it off for now.  Retained needle fragment.  X-ray confirms 8 mm needle fragment anterior to proximal humerus (prior surgery 05/2016, for retained needle with associated infection). No signs or symptoms of acute infection at site. This was discussed with with Silvestre Gunner (on 8/23) who agrees given asymptomatic, location (not in joint) no need for surgical intervention  Polysubstance abuse Patient and her mother have arranged for outpatient follow-up with addiction clinic. Patient is currently on methadone.Patient has contactedoneof the outpatient clinics to continue to receive methadone in the outpatient setting. She will be seen there on Friday 9/20.  She received her last dose of methadone on 9/19 at 10:18 AM.  Urinary retention and stool incontinence This has resolved. Briefly required Foley for urinary retention which is now resolved. Flomax was discontinued.  Constipation Continue bowel regimen.  Normocytic Anemia. Low folate. Hemoglobin is stable.   Left LE numbness Likely secondary to her spinal disease. Stable to improving. Continue to monitor for now. No DVT noted on Doppler study.   Overall stable.  Patient has significantly improved in the last several weeks she has been here.  Seems very anxious about getting discharge.  She was reassured.  Follow-up plan discussed in detail over the last few days.  Okay for discharge today.  PICC line will be removed prior to discharge.   PERTINENT LABS:  The results of significant diagnostics from this hospitalization (including imaging, microbiology, ancillary and laboratory) are listed below for reference.    Microbiology: Recent Results (from the past 240 hour(s))  Culture, blood (routine x 2)     Status: None   Collection Time: 01/24/18  12:16 AM  Result Value Ref Range Status   Specimen Description BLOOD LEFT ANTECUBITAL  Final   Special Requests   Final    BOTTLES DRAWN AEROBIC ONLY Blood Culture adequate volume   Culture   Final    NO GROWTH 5 DAYS Performed at [facility], [address], [place], [place] [address]    Report Status 01/29/2018 FINAL  Final  Culture, blood (routine x 2)     Status: None   Collection Time: 01/24/18 12:27 AM  Result Value Ref Range Status   Specimen Description BLOOD LEFT HAND  Final   Special Requests   Final    BOTTLES DRAWN AEROBIC ONLY Blood Culture adequate volume   Culture   Final    NO GROWTH 5 DAYS Performed at [facility], [place] [address], [place], [place] [address]    Report Status 01/29/2018 FINAL  Final     Labs: Basic Metabolic Panel: Recent Labs  Lab 01/27/18 0335  NA 136  K 4.1  CL 102  CO2 28  GLUCOSE 113*  BUN 20  CREATININE 0.70  CALCIUM 9.2    IMAGING STUDIES Dg Elbow 2 Views Right  Result Date: 12/31/2017 CLINICAL DATA:  Pt c/o possible foreign bodies (needles) in her right anterior elbow and right anterior distal forearm. Pt states she had surgery to remove a needle in her right elbow 1.5 years ago. EXAM: RIGHT ELBOW - 2 VIEW COMPARISON:  None. FINDINGS: There is a radiopaque linear foreign body measuring 8 millimeters along the distal anterior aspect of the humerus, consistent with needle fragment. No acute fracture or soft tissue gas. IMPRESSION: 8 millimeter needle fragment anterior to the proximal humerus. Electronically Signed   By: Nolon Nations M.D.   On: 12/31/2017  21:09   Dg Forearm Right  Result Date: 12/31/2017 CLINICAL DATA:  Pt c/o possible foreign bodies (needles) in her right anterior elbow and right anterior distal forearm. Pt states she had surgery to remove a needle in her right elbow 1.5 years ago. EXAM: RIGHT FOREARM - 2 VIEW COMPARISON:  05/27/2014 FINDINGS: There is a linear metallic fragment along the MEDIAL  aspect of the distal radius which measures 8 millimeters in length. This is oriented anterior to posterior. No acute fracture. No soft tissue gas. IMPRESSION: 8 millimeter needle fragment along the distal MEDIAL aspect of the radius. Electronically Signed   By: Nolon Nations M.D.   On: 12/31/2017 21:08   Mr Lumbar Spine W Wo Contrast  Result Date: 01/28/2018 CLINICAL DATA:  F/u scan to prev infection MSSA with abscess. Pt has improved with treatment able to ambulate with walker now. Hx scoliosis and surgery. Unable to obtain fs sag post contrast due to metal EXAM: MRI LUMBAR SPINE WITHOUT AND WITH CONTRAST TECHNIQUE: Multiplanar and multiecho pulse sequences of the lumbar spine were obtained without and with intravenous contrast. CONTRAST:  8.5 mL Gadavist COMPARISON:  01/10/2018, 12/24/2017, 12/05/2017 FINDINGS: Segmentation:  Standard. Alignment:  2 mm retrolisthesis of L5 on S1. Vertebrae: Vertebral body heights are maintained. Disc height loss at L5-S1 with fluid signal within the disc with endplate reactive edema and enhancement. No aggressive osseous lesion. Conus medullaris and cauda equina: Conus medullaris is obscured. Persistent enhancement of the cauda equina from L3 and caudally. No epidural fluid collection. Paraspinal and other soft tissues: Increased T2 signal within the paraspinal musculature at the level of L4-5 likely related to denervation. Disc levels: T12 through L3 vertebral body and posterior elements as well as the central canal are obscured by orthopedic fixation hardware. L3-L4: Posterior decompression. No disc protrusion, foraminal stenosis or central canal stenosis. L4-L5: Posterior decompression. Small broad-based disc bulge. Mild foraminal stenosis. L5-S1: Posterior decompression. Prior discectomy. Broad-based disc bulge with epidural enhancement. No drainable fluid collection. Mild right and moderate left facet arthropathy with mild residual enhancement. Mild right and moderate  left foraminal stenosis similar to the prior exam. IMPRESSION: 1. Stable appearance of disc edema at L5-S1 with reactive endplate changes likely related to recent discectomy. Persistent broad-based disc bulge. Persistent epidural enhancement consistent with inflammation/infection which is similar in appearance to the prior examination. No drainable epidural fluid collection to suggest an abscess. Postsurgical changes in the posterior paraspinal soft tissues from decompression. Bilateral facet arthropathy with mild residual enhancement similar to the prior examination. 2. Stable persistent enhancement of the cauda equina from L3 and caudally, which may be inflammatory or infectious. Electronically Signed   By: Kathreen Devoid   On: 01/28/2018 12:04   Mr Lumbar Spine W Wo Contrast  Result Date: 01/10/2018 CLINICAL DATA:  Numbness and tingling. History of lumbar spine surgery December 05, 2017, intravenous drug abuse. EXAM: MRI LUMBAR SPINE WITHOUT AND WITH CONTRAST TECHNIQUE: Multiplanar and multiecho pulse sequences of the lumbar spine were obtained without and with intravenous contrast. Per technologist note, metal suppression technique utilized. CONTRAST:  29m MULTIHANCE GADOBENATE DIMEGLUMINE 529 MG/ML IV SOLN COMPARISON:  MRI lumbar spine December 24, 2017 FINDINGS: Spinal hardware present resulting in extensive susceptibility artifact. SEGMENTATION: For the purposes of this report, the last well-formed intervertebral disc is reported as L5-S1. ALIGNMENT: Maintained lumbar lordosis. No definite malalignment. Thoracic through L3 Harrington rods with laminar hooks again noted. VERTEBRAE:Vertebral bodies are intact. Stable moderate L4-5 disc height loss. Moderate to severe L5-S1  disc height loss progressed from prior imaging. Disc desiccation lower lumbar discs with superimposed disc edema L5-S1 with endplate enhancement and enhancement of the posterior L5-S1 disc. No definite abnormal enhancement of the sacrum. CONUS  MEDULLARIS AND CAUDA EQUINA: Conus medullaris obscured. Persistent though improved enhancement of the cauda equina from L3-4 caudally, obscured above this level. Persistent though improved focal smudgy intrathecal enhancement at L4-5 towards the RIGHT (series 11, image 20). No epidural collection. PARASPINAL AND OTHER SOFT TISSUES: Denervation without convincing evidence of myositis. No focal fluid collection. DISC LEVELS: L3-4: Posterior decompression. No disc bulge, canal stenosis or neural foraminal narrowing. L4-5: Posterior decompression. Small broad-based disc bulge without canal stenosis. Mild neural foraminal narrowing. L5-S1: Posterior decompression. Small, decreased disc bulge with enhancement versus residual infection improved from prior imaging. Mild RIGHT and moderate LEFT facet arthropathy with minimal residual enhancement improved. No canal stenosis. Mild RIGHT and moderate to severe LEFT neural foraminal narrowing is similar. IMPRESSION: 1. Limited assessment due to spinal fixation rods. Status post L3-4 through L5-S1 posterior decompression without residual or recurrent epidural abscess. 2. Small residual L5-S1 disc protrusion versus abscess improved from prior examination with decrease, mild potential L5-S1 discitis osteomyelitis. 3. Persistent though improved abnormal enhancement of the cauda equina and intrathecal enhancement, possibly infectious. 4. No canal stenosis. Persistent neural foraminal narrowing L4-5 and L5-S1: Moderate to severe on the LEFT at L5-S1. Electronically Signed   By: Elon Alas M.D.   On: 01/10/2018 22:18    DISCHARGE EXAMINATION: Vitals:   01/29/18 0508 01/29/18 1613 01/29/18 2129 01/30/18 0455  BP: 103/63 115/73 124/73 115/74  Pulse: 73 94 82 71  Resp: _0 Temp: 97.8 F (36.6 C) 98 F (36.7 C) 98.1 F (36.7 C) 97.7 F (36.5 C)  TempSrc: Oral Oral Oral Oral  SpO2: 98% 100% 98% 98%  Weight:      Height:       General appearance: alert,  cooperative, appears stated age and no distress Resp: clear to auscultation bilaterally Cardio: regular rate and rhythm, S1, S2 normal, no murmur, click, rub or gallop GI: soft, non-tender; bowel sounds normal; no masses,  no organomegaly  DISPOSITION: Home  Discharge Instructions    Ambulatory referral to Physical Therapy   Complete by:  As directed    Epidural abcess s/p lami tricuspid valve endocarditis with pulmonary abcess secondary to MSSA    PCP Chelle Jeffrey [phone]   Iontophoresis - 4 mg/ml of dexamethasone:  No   T.E.N.S. Unit Evaluation and Dispense as Indicated:  No   Call MD for:  difficulty breathing, headache or visual disturbances   Complete by:  As directed    Call MD for:  extreme fatigue   Complete by:  As directed    Call MD for:  persistant dizziness or light-headedness   Complete by:  As directed    Call MD for:  persistant nausea and vomiting   Complete by:  As directed    Call MD for:  severe uncontrolled pain   Complete by:  As directed    Call MD for:  temperature >100.4   Complete by:  As directed    Diet general   Complete by:  As directed    Discharge instructions   Complete by:  As directed    Please be sure to make appointments to see Dr. Buddy Duty with endocrinology and Dr. Arnoldo Morale with neurosurgery.  Please keep appointments with the other providers as well.  Patient has been  on methadone 60 mg daily in the hospital. Last dose was on 01/30/2018 at 10:18 AM.  You were cared for by a hospitalist during your hospital stay. If you have any questions about your discharge medications or the care you received while you were in the hospital after you are discharged, you can call the unit and asked to speak with the hospitalist on call if the hospitalist that took care of you is not available. Once you are discharged, your primary care physician will handle any further medical issues. Please note that NO REFILLS for any discharge medications will be  authorized once you are discharged, as it is imperative that you return to your primary care physician (or establish a relationship with a primary care physician if you do not have one) for your aftercare needs so that they can reassess your need for medications and monitor your lab values. If you do not have a primary care physician, you can call [phone] for a physician referral.   Increase activity slowly   Complete by:  As directed        Allergies as of 01/30/2018      Reactions   Hydrocodone Itching   Morphine And Related Nausea And Vomiting      Medication List    STOP taking these medications   buPROPion 150 MG 24 hr tablet Commonly known as:  WELLBUTRIN XL   gabapentin 300 MG capsule Commonly known as:  NEURONTIN   hydrOXYzine 50 MG tablet Commonly known as:  ATARAX/VISTARIL   ketorolac 10 MG tablet Commonly known as:  TORADOL   nicotine 21 mg/24hr patch Commonly known as:  NICODERM CQ - dosed in mg/24 hours   phenazopyridine 200 MG tablet Commonly known as:  PYRIDIUM   traZODone 50 MG tablet Commonly known as:  DESYREL     TAKE these medications   acetaminophen 500 MG tablet Commonly known as:  TYLENOL Take 2 tablets (1,000 mg total) by mouth every 8 (eight) hours.   cephALEXin 500 MG capsule Commonly known as:  KEFLEX Take 1 capsule (500 mg total) by mouth every 12 (twelve) hours.   cyclobenzaprine 10 MG tablet Commonly known as:  FLEXERIL Take 1 tablet (10 mg total) by mouth 3 (three) times daily.   ferrous sulfate 325 (65 FE) MG tablet Take 1 tablet (325 mg total) by mouth daily with breakfast. Start taking on:  01/31/2018   lactobacillus Pack Take 1 packet (1 g total) by mouth 3 (three) times daily with meals.   magnesium oxide 400 (241.3 Mg) MG tablet Commonly known as:  MAG-OX Take 1 tablet (400 mg total) by mouth 2 (two) times daily.   methadone 10 MG/5ML solution Commonly known as:  DOLOPHINE Take 30 mLs (60 mg total) by mouth  daily. What changed:  how much to take   naproxen 500 MG tablet Commonly known as:  NAPROSYN Take 1 tablet (500 mg total) by mouth 2 (two) times daily as needed for moderate pain.   nicotine polacrilex 2 MG gum Commonly known as:  NICORETTE Take 1 each (2 mg total) by mouth as needed for smoking cessation.   oxyCODONE 5 MG immediate release tablet Commonly known as:  Oxy IR/ROXICODONE Take 1-2 tablets (5-10 mg total) by mouth every 4 (four) hours as needed for severe pain.   pantoprazole 40 MG tablet Commonly known as:  PROTONIX Take 1 tablet (40 mg total) by mouth daily.   polyethylene glycol packet Commonly known as:  MIRALAX / GLYCOLAX  Take 17 g by mouth 2 (two) times daily.   psyllium 95 % Pack Commonly known as:  HYDROCIL/METAMUCIL Take 1 packet by mouth daily.   senna-docusate 8.6-50 MG tablet Commonly known as:  Senokot-S Take 1 tablet by mouth 2 (two) times daily.        Follow-up Information    Lorretta Harp, MD. Go on 02/11/2018.   Specialties:  Cardiology, Radiology Why:  on 02/11/18 at 8:45A Contact information: [address] [place] [address] [place] Follow up.   Specialty:  Rehabilitation Contact information: [address] [id number] mc [place] [phone]       [place] Callas, NP Follow up.   Specialty:  Infectious Diseases Why:  Appt is on 02/06/18 at [facility] information: [place] [address] [phone]        Delrae Rend, MD. Schedule an appointment as soon as possible for a visit in 2 week(s).   Specialty:  Endocrinology Contact information: [address] [place] [address] [phone]        Newman Pies, MD. Schedule an appointment as soon as possible for a visit in 3 week(s).   Specialty:  Neurosurgery Contact information: [address] [place]  [address] [place] [address] [phone]           TOTAL DISCHARGE TIME: 35 mins  Bonnielee Haff  [facility] Pager [phone]  01/30/2018, 11:40 AM

## 2018-01-30 NOTE — Progress Notes (Signed)
Physical Therapy Treatment Patient Details Name: Kelly [name] MRN: [id number] DOB: 1986-03-07 Today's Date: 01/30/2018    History of Present Illness Pt is a 32 y.o. female with sepsis, tachycardia, tachypnea, leukocytosis, hypotension.  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 PMH of polysubstance abuse, ADD, scoliosis sp back surgery (20 years ago per mom). PICC placed 12/08/17.    PT Comments    Pt very focused on getting her transportation scheduled for her clinic appointments. Therefore, remained in room as pt was waiting a phone call from her hospital room phone. PT answered all of patient's questions throughout session. Pt would continue to benefit from skilled physical therapy services at this time while admitted and after d/c to address the below listed limitations in order to improve overall safety and independence with functional mobility.    Follow Up Recommendations  Outpatient PT;Supervision for mobility/OOB     Equipment Recommendations  Rolling walker with 5" wheels;3in1 (PT);Cane    Recommendations for Other Services       Precautions / Restrictions Precautions Precautions: Back Precaution Booklet Issued: No Restrictions Weight Bearing Restrictions: No    Mobility  Bed Mobility Overal bed mobility: Independent                Transfers Overall transfer level: Needs assistance Equipment used: None Transfers: Sit to/from Stand Sit to Stand: Supervision         General transfer comment: Supervision for safety.   Ambulation/Gait Ambulation/Gait assistance: Supervision;Min guard Gait Distance (Feet): 20 Feet Assistive device: None Gait Pattern/deviations: Step-through pattern;Decreased step length - right;Decreased step length - left;Decreased stride length;Trendelenburg;Drifts right/left     General Gait Details: pt with very unsteady gait without use of an AD  in her room; pt very focused on getting her transportation scheduled via phone throughout session and therefore declining further gait   Stairs             Wheelchair Mobility    Modified Rankin (Stroke Patients Only)       Balance Overall balance assessment: Needs assistance Sitting-balance support: No upper extremity supported Sitting balance-Leahy Scale: Good Sitting balance - Comments: pt able to sit EOB without support ~20 mins while reaching within and outside of her BoS   Standing balance support: No upper extremity supported Standing balance-Leahy Scale: Fair                              Cognition Arousal/Alertness: Awake/alert Behavior During Therapy: WFL for tasks assessed/performed Overall Cognitive Status: Within Functional Limits for tasks assessed                                        Exercises Other Exercises Other Exercises: Reviewed in detail her HEP program including bilateral LE strengthening exercises and balance activities    General Comments        Pertinent Vitals/Pain Pain Assessment: Faces Faces Pain Scale: Hurts a little bit Pain Location: back Pain Descriptors / Indicators: Sore Pain Intervention(s): Monitored during session;Repositioned    Home Living                      Prior Function            PT Goals (current goals can now be found in  the care plan section) Acute Rehab PT Goals Patient Stated Goal: "to go to my son's soccer game this weekend"  PT Goal Formulation: With patient Time For Goal Achievement: 01/31/18 Potential to Achieve Goals: Good Progress towards PT goals: Progressing toward goals    Frequency    Min 3X/week      PT Plan Current plan remains appropriate    Co-evaluation              AM-PAC PT "6 Clicks" Daily Activity  Outcome Measure  Difficulty turning over in bed (including adjusting bedclothes, sheets and blankets)?: None Difficulty moving  from lying on back to sitting on the side of the bed? : None Difficulty sitting down on and standing up from a chair with arms (e.g., wheelchair, bedside commode, etc,.)?: A Lot Help needed moving to and from a bed to chair (including a wheelchair)?: None Help needed walking in hospital room?: A Little Help needed climbing 3-5 steps with a railing? : A Little 6 Click Score: 20    End of Session   Activity Tolerance: Patient tolerated treatment well Patient left: in bed;with call bell/phone within reach;Other (comment)(RN in room changing PICC line dressing) Nurse Communication: Mobility status PT Visit Diagnosis: Other abnormalities of gait and mobility (R26.89);Pain Pain - part of body: (back)     Time: 3664-4034 PT Time Calculation (min) (ACUTE ONLY): 45 min  Charges:  $Therapeutic Activity: 38-52 mins                     Sherie Don, [place], DPT  Acute Rehabilitation Services Pager [phone] Office [place] 01/30/2018, 1:26 PM

## 2018-01-30 NOTE — Progress Notes (Signed)
Patient discharged to home with instructions, prescriptions and a walker.

## 2018-01-30 NOTE — Social Work (Signed)
CSW spoke with pt regarding transportation services. Pt understandably upset regarding inability to get in touch with social worker at [place]. Pt was inquiring as to if CSW would be able to get in touch with CSW at [facility] more rapidly than she can.   CSW is unable to reach [facility] social workers any more rapidly than pt is able to. Pt upset that CSW is unable to complete this request. CSW spoke with pt about arranging [facility] or [employer] rides for methadone appointments until family or Medicaid is able to transport. CSW dept does not have funds to support transportation to appointments at [facility].   CSW signing off. Please consult if any additional needs arise.  Kelly [name], [place] [facility] [phone]

## 2018-01-30 NOTE — Discharge Instructions (Signed)
Bacteremia °Bacteremia is the presence of bacteria in the blood. When bacteria enter the bloodstream, they can cause a life-threatening reaction called sepsis, which is a medical emergency. Bacteremia can spread to other parts of the body, including the heart, joints, and brain. °What are the causes? °This condition is caused by bacteria that get into the blood. Bacteria can enter the blood: °· From a skin infection or a cut on your skin. °· During an episode of pneumonia. °· From an infection in your stomach or intestine (gastrointestinal infection). °· From an infection in your bladder or urinary system (urinary tract infection). °· During a dental or medical procedure. °· After you brush your teeth so hard that your gums bleed. °· When a bacterial infection in another part of the body spreads to the blood. °· Through a dirty needle. ° °What increases the risk? °This condition is more likely to develop in: °· Children. °· Elderly adults. °· People who have a long-lasting (chronic) disease or medical condition. °· People who have an artificial joint or heart valve. °· People who have heart valve disease. °· People who have a tube, such as a catheter or IV tube, that has been inserted for a medical treatment. °· People who have a weak body defense system (immune system). °· People who use IV drugs. ° °What are the signs or symptoms? °Symptoms of this condition include: °· Fever. °· Chills. °· A racing heart. °· Shortness of breath. °· Dizziness. °· Weakness. °· Confusion. °· Nausea or vomiting. °· Diarrhea. ° °In some cases, there are no symptoms. Bacteremia that has spread to the other parts of the body may cause symptoms in those areas. °How is this diagnosed? °This condition may be diagnosed with a physical exam and tests, such as: °· A complete blood count (CBC). This test looks for signs of infection. °· Blood cultures. These look for bacteria in your blood. °· Tests of any tubes that you may have inserted into  your body, such as an IV tube or urinary catheter. These tests look for a source of infection. °· Urine tests including urine cultures. These look for bacteria in the urine that could be a source of infection. °· Imaging tests, such as an X-ray, CT scan, MRI, or heart ultrasound. These look for a source of infection in other parts of the body, such as the lungs, heart valves, or joints. ° °How is this treated? °This condition may be treated with: °· Antibiotic medicines given through an IV infusion. Depending on the source of infection, antibiotics may be needed for several weeks. At first, an antibiotic may be given to kill most types of blood bacteria. If your test results show that a certain kind of bacteria is causing problems, the antibiotic may be changed to kill only the bacteria that are causing problems. °· Antibiotics taken by mouth. °· IV fluids to support the body as you fight the infection. °· Removing any catheter or device that could be a source of infection. °· Blood pressure and breathing support, if you have sepsis. °· Surgery to control the source or spread of infection, such as: °? Removing an infected implanted device. °? Removing infected tissue or an abscess. ° °This condition is usually treated at a hospital. If you are treated at home, you may need to come back for medicines, blood tests, and evaluation. This is important. °Follow these instructions at home: °· Take over-the-counter and prescription medicines only as told by your health care provider. °·   If you were prescribed an antibiotic, take it as told by your health care provider. Do not stop taking the antibiotic even if you start to feel better. °· Rest until your condition is under control. °· Drink enough fluid to keep your urine clear or pale yellow. °· Do not smoke. If you need help quitting, ask your health care provider. °· Keep all follow-up visits as told by your health care provider. This is important. °How is this  prevented? °· Get the vaccinations that your health care provider recommends. °· Clean and cover any scrapes or cuts. °· Take good care of your skin. This includes regular bathing and moisturizing. °· Wash your hands often. °· Practice good oral hygiene. Brush your teeth two times a day and floss regularly. °Get help right away if: °· You have pain. °· You have a fever. °· You have trouble breathing. °· Your skin becomes blotchy, pale, or clammy. °· You develop confusion, dizziness, or weakness. °· You develop diarrhea. °· You develop any new symptoms after treatment. °Summary °· Bacteremia is the presence of bacteria in the blood. When bacteria enter the bloodstream, they can cause a life- threatening reaction called sepsis. °· Children and elderly adults are at increased risk of bacteremia. Other risk factors include having a long-lasting (chronic) disease or a weak immune system, having an artificial joint or heart valve, having heart valve disease, having tubes that were inserted in the body for medical treatment, or using IV drugs. °· Some symptoms of bacteremia include fever, chills, shortness of breath, confusion, nausea or vomiting, and diarrhea. °· Tests may be done to diagnose a source of infection that led to bacteremia. These tests may include blood tests, urine tests, and imaging tests. °· Bacteremia is usually treated with antibiotics, usually in a hospital. °This information is not intended to replace advice given to you by your health care provider. Make sure you discuss any questions you have with your health care provider. °Document Released: 02/11/2006 Document Revised: 03/27/2016 Document Reviewed: 03/27/2016 °Elsevier Interactive Patient Education © 2018 [employer] ° °

## 2018-01-30 NOTE — Progress Notes (Signed)
Occupational Therapy Treatment Patient Details Name: Kelly [name] MRN: [id number] DOB: 1986/02/01 Today's Date: 01/30/2018    History of present illness Pt is a 32 y.o. female with sepsis, tachycardia, tachypnea, leukocytosis, hypotension.  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 PMH of polysubstance abuse, ADD, scoliosis sp back surgery (20 years ago per mom). PICC placed 12/08/17.   OT comments  Pt progressing towards acute OT goals. Focus of session was toilet transfer, pericare, grooming tasks standing at sink. Pt reporting some fatigue at end of session. Educated on energy conservation during ADLs and provided handout. D/c plan remains appropriate.    Follow Up Recommendations  Supervision/Assistance - 24 hour;Outpatient OT    Equipment Recommendations  3 in 1 bedside commode    Recommendations for Other Services      Precautions / Restrictions Precautions Precautions: Back Precaution Booklet Issued: No Restrictions Weight Bearing Restrictions: No       Mobility Bed Mobility Overal bed mobility: Independent                Transfers Overall transfer level: Needs assistance Equipment used: (IV pole) Transfers: Sit to/from Stand Sit to Stand: Supervision;Modified independent (Device/Increase time)         General transfer comment: Supervision for safety.     Balance Overall balance assessment: Needs assistance Sitting-balance support: No upper extremity supported;Feet supported Sitting balance-Leahy Scale: Good     Standing balance support: No upper extremity supported;Single extremity supported Standing balance-Leahy Scale: Fair                             ADL either performed or assessed with clinical judgement   ADL Overall ADL's : Needs assistance/impaired     Grooming: Modified independent;Wash/dry hands;Standing                    Toilet Transfer: Supervision/safety;Ambulation;Comfort height toilet Toilet Transfer Details (indicate cue type and reason): IV pole to steady Toileting- Clothing Manipulation and Hygiene: Supervision/safety;Sitting/lateral lean       Functional mobility during ADLs: Supervision/safety General ADL Comments: Pt completed toilet transfer, pericare, and grooming tasks. Reports she has gotten a tub transfer bench to use at home. Provided energy conservation handout.     Vision       Perception     Praxis      Cognition Arousal/Alertness: Awake/alert Behavior During Therapy: WFL for tasks assessed/performed Overall Cognitive Status: Within Functional Limits for tasks assessed                                          Exercises     Shoulder Instructions       General Comments      Pertinent Vitals/ Pain       Pain Assessment: Faces Faces Pain Scale: Hurts little more Pain Location: Lower back/L leg Pain Descriptors / Indicators: Sore Pain Intervention(s): Monitored during session  Home Living                                          Prior Functioning/Environment              Frequency  Min 2X/week  Progress Toward Goals  OT Goals(current goals can now be found in the care plan section)  Progress towards OT goals: Progressing toward goals  Acute Rehab OT Goals Patient Stated Goal: "to go to my son's soccer game this weekend"  OT Goal Formulation: With patient Time For Goal Achievement: 02/04/18 Potential to Achieve Goals: Good ADL Goals Pt Will Perform Grooming: Independently;standing Pt Will Perform Lower Body Bathing: Independently;sit to/from stand Pt Will Perform Lower Body Dressing: Independently;sit to/from stand Pt Will Transfer to Toilet: Independently;ambulating;regular height toilet Pt Will Perform Toileting - Clothing Manipulation and hygiene: Independently;sit to/from stand Pt Will Perform  Tub/Shower Transfer: with modified independence;ambulating;3 in 1;rolling walker Additional ADL Goal #1: Pt will perform bed mobility (maintaining back precautions) at Mod A prior to engagingin ADL activity  Plan Frequency remains appropriate;Discharge plan remains appropriate    Co-evaluation                 AM-PAC PT "6 Clicks" Daily Activity     Outcome Measure   Help from another person eating meals?: None Help from another person taking care of personal grooming?: None Help from another person toileting, which includes using toliet, bedpan, or urinal?: None Help from another person bathing (including washing, rinsing, drying)?: A Little Help from another person to put on and taking off regular upper body clothing?: None Help from another person to put on and taking off regular lower body clothing?: A Little 6 Click Score: 22    End of Session    OT Visit Diagnosis: Unsteadiness on feet (R26.81);Other abnormalities of gait and mobility (R26.89);Pain Pain - Right/Left: Left   Activity Tolerance Patient tolerated treatment well   Patient Left in bed;with call bell/phone within reach   Nurse Communication          Time: 0962-8366 OT Time Calculation (min): 18 min  Charges: OT General Charges $OT Visit: 1 Visit OT Treatments $Self Care/Home Management : 8-22 mins  Tyrone Schimke, OT Acute Rehabilitation Services Pager: [phone] Office: [phone]    Hortencia Pilar 01/30/2018, 10:13 AM

## 2018-02-06 ENCOUNTER — Inpatient Hospital Stay: Payer: Self-pay | Admitting: Infectious Diseases

## 2018-02-11 ENCOUNTER — Ambulatory Visit: Payer: Self-pay | Admitting: Cardiovascular Disease

## 2018-02-11 ENCOUNTER — Inpatient Hospital Stay: Payer: Self-pay | Admitting: Family

## 2018-03-05 ENCOUNTER — Ambulatory Visit: Payer: Medicaid Other | Admitting: Cardiovascular Disease

## 2018-03-23 ENCOUNTER — Encounter: Payer: Self-pay | Admitting: Student

## 2018-03-23 ENCOUNTER — Inpatient Hospital Stay (HOSPITAL_COMMUNITY)
Admission: AD | Admit: 2018-03-23 | Discharge: 2018-03-23 | Disposition: A | Discharge status: Home / Self Care | Payer: Medicaid Other | Attending: Obstetrics & Gynecology | Admitting: Obstetrics & Gynecology

## 2018-03-23 DIAGNOSIS — Z3200 Encounter for pregnancy test, result unknown: Secondary | ICD-10-CM | POA: Diagnosis present

## 2018-03-23 DIAGNOSIS — Z3201 Encounter for pregnancy test, result positive: Secondary | ICD-10-CM | POA: Diagnosis not present

## 2018-03-23 LAB — URINALYSIS, ROUTINE W REFLEX MICROSCOPIC
BILIRUBIN URINE: NEGATIVE
Glucose, UA: NEGATIVE mg/dL
Ketones, ur: NEGATIVE mg/dL
Nitrite: POSITIVE — AB
Protein, ur: 30 mg/dL — AB
SPECIFIC GRAVITY, URINE: 1.028 (ref 1.005–1.030)
WBC, UA: >50 WBC/hpf — ABNORMAL HIGH (ref 0–5)
pH: 5 (ref 5.0–8.0)

## 2018-03-23 LAB — POCT PREGNANCY, URINE: PREG TEST UR: POSITIVE — AB

## 2018-03-23 NOTE — MAU Provider Note (Signed)
  S:   32 y.o. [id number] @Unknown  by LMP presents to MAU for pregnancy confirmation.  She denies abdominal pain or vaginal bleeding today.   Pt discharged from hospital in September after 2 month admission for endocarditis r/t IV drug use. Has cardiology f/u later this month to determine if cardiac surgery needed.   O: BP 133/82 (BP Location: Right Arm)   Temp (!) 97.4 F (36.3 C) (Oral)   Ht 5' 7"  (1.702 m)   Wt 80.3 kg   LMP  (LMP Unknown)   SpO2 100%   BMI 27.74 kg/m  Physical Examination: General appearance - alert, well appearing, and in no distress, oriented to person, place, and time and acyanotic, in no respiratory distress  Results for orders placed or performed during the hospital encounter of 03/23/18 (from the past 48 hour(s))  Pregnancy, urine POC     Status: Abnormal   Collection Time: 03/23/18  9:16 PM  Result Value Ref Range   Preg Test, Ur POSITIVE (A) NEGATIVE    Comment:        THE SENSITIVITY OF THIS METHODOLOGY IS >24 mIU/mL     A: Positive pregnancy test -performed d/t high risk  P: D/C home Start prenatal care ASAP - wants to go back to Dr. Gaetano Net. Given list of providers  Jorje Guild, NP 9:17 PM

## 2018-03-23 NOTE — Discharge Instructions (Signed)
[place] for [place] at [facility]       Phone: [phone]  [facility] for [place] at [place] Phone: [place] for [place] at [place]  Phone: [place] for [place] at [name]  Phone: [place] for [place] at [place]  Phone: [facility]       Phone: [phone]  [facility]    Phone: [phone]   [facility])    Phone: [facility]    Phone: [phone]  [name]    Phone: [place] [facility]  Phone: [place]    Phone: [phone]  [facility]   Phone: [phone]  [facility]    Phone: [phone]

## 2018-03-23 NOTE — MAU Note (Addendum)
Pt states she is here for confirmation of pregnancy.  Denies VB/pain at this time. Some nausea and vomiting.

## 2018-03-26 LAB — CULTURE, OB URINE

## 2018-03-27 ENCOUNTER — Telehealth: Payer: Self-pay | Admitting: Advanced Practice Midwife

## 2018-03-27 MED ORDER — CEPHALEXIN 500 MG PO CAPS: 500.0000 mg | ORAL_CAPSULE | Freq: Four times a day (QID) | ORAL | 0 refills | Status: DC

## 2018-03-27 NOTE — Telephone Encounter (Signed)
Called to notify pt of urine culture showing UTI and Rx for Keflex sent to pharmacy. Pt states understanding and will start abx tomorrow.

## 2018-04-01 ENCOUNTER — Emergency Department (HOSPITAL_COMMUNITY): Payer: Medicaid Other

## 2018-04-01 ENCOUNTER — Encounter (HOSPITAL_COMMUNITY): Payer: Self-pay | Admitting: Emergency Medicine

## 2018-04-01 ENCOUNTER — Emergency Department (HOSPITAL_COMMUNITY)
Admission: EM | Admit: 2018-04-01 | Discharge: 2018-04-02 | Disposition: A | Discharge status: Home / Self Care | Payer: Medicaid Other | Attending: Emergency Medicine | Admitting: Emergency Medicine

## 2018-04-01 ENCOUNTER — Other Ambulatory Visit: Payer: Self-pay

## 2018-04-01 DIAGNOSIS — O10011 Pre-existing essential hypertension complicating pregnancy, first trimester: Secondary | ICD-10-CM | POA: Insufficient documentation

## 2018-04-01 DIAGNOSIS — R55 Syncope and collapse: Secondary | ICD-10-CM

## 2018-04-01 DIAGNOSIS — O26811 Pregnancy related exhaustion and fatigue, first trimester: Secondary | ICD-10-CM | POA: Insufficient documentation

## 2018-04-01 DIAGNOSIS — R1032 Left lower quadrant pain: Secondary | ICD-10-CM | POA: Insufficient documentation

## 2018-04-01 DIAGNOSIS — R011 Cardiac murmur, unspecified: Secondary | ICD-10-CM | POA: Insufficient documentation

## 2018-04-01 DIAGNOSIS — Z79899 Other long term (current) drug therapy: Secondary | ICD-10-CM | POA: Diagnosis not present

## 2018-04-01 DIAGNOSIS — O26891 Other specified pregnancy related conditions, first trimester: Secondary | ICD-10-CM | POA: Insufficient documentation

## 2018-04-01 DIAGNOSIS — O99331 Smoking (tobacco) complicating pregnancy, first trimester: Secondary | ICD-10-CM | POA: Insufficient documentation

## 2018-04-01 DIAGNOSIS — F17210 Nicotine dependence, cigarettes, uncomplicated: Secondary | ICD-10-CM | POA: Diagnosis not present

## 2018-04-01 DIAGNOSIS — Z3A09 9 weeks gestation of pregnancy: Secondary | ICD-10-CM | POA: Diagnosis not present

## 2018-04-01 DIAGNOSIS — R102 Pelvic and perineal pain: Secondary | ICD-10-CM | POA: Insufficient documentation

## 2018-04-01 DIAGNOSIS — R1031 Right lower quadrant pain: Secondary | ICD-10-CM | POA: Diagnosis not present

## 2018-04-01 DIAGNOSIS — O2341 Unspecified infection of urinary tract in pregnancy, first trimester: Secondary | ICD-10-CM | POA: Insufficient documentation

## 2018-04-01 DIAGNOSIS — O99411 Diseases of the circulatory system complicating pregnancy, first trimester: Secondary | ICD-10-CM | POA: Insufficient documentation

## 2018-04-01 LAB — CBC WITH DIFFERENTIAL/PLATELET
ABS IMMATURE GRANULOCYTES: 0.06 10*3/uL (ref 0.00–0.07)
BASOS PCT: 0 %
Basophils Absolute: 0 10*3/uL (ref 0.0–0.1)
Eosinophils Absolute: 0 10*3/uL (ref 0.0–0.5)
Eosinophils Relative: 0 %
HCT: 37.3 % (ref 36.0–46.0)
Hemoglobin: 12 g/dL (ref 12.0–15.0)
Immature Granulocytes: 0 %
Lymphocytes Relative: 12 %
Lymphs Abs: 1.7 10*3/uL (ref 0.7–4.0)
MCH: 27.3 pg (ref 26.0–34.0)
MCHC: 32.2 g/dL (ref 30.0–36.0)
MCV: 84.8 fL (ref 80.0–100.0)
Monocytes Absolute: 0.5 10*3/uL (ref 0.1–1.0)
Monocytes Relative: 4 %
NEUTROS ABS: 11.5 10*3/uL — AB (ref 1.7–7.7)
Neutrophils Relative %: 84 %
PLATELETS: 330 10*3/uL (ref 150–400)
RBC: 4.4 MIL/uL (ref 3.87–5.11)
RDW: 13.2 % (ref 11.5–15.5)
WBC: 13.8 10*3/uL — ABNORMAL HIGH (ref 4.0–10.5)
nRBC: 0 % (ref 0.0–0.2)

## 2018-04-01 LAB — COMPREHENSIVE METABOLIC PANEL
ALBUMIN: 3.4 g/dL — AB (ref 3.5–5.0)
ALT: 10 U/L (ref 0–44)
AST: 20 U/L (ref 15–41)
Alkaline Phosphatase: 114 U/L (ref 38–126)
Anion gap: 9 (ref 5–15)
BILIRUBIN TOTAL: 0.7 mg/dL (ref 0.3–1.2)
BUN: 7 mg/dL (ref 6–20)
CHLORIDE: 100 mmol/L (ref 98–111)
CO2: 23 mmol/L (ref 22–32)
Calcium: 9.4 mg/dL (ref 8.9–10.3)
Creatinine, Ser: 0.59 mg/dL (ref 0.44–1.00)
GFR calc Af Amer: >60 mL/min (ref >60)
GFR calc non Af Amer: >60 mL/min (ref >60)
GLUCOSE: 88 mg/dL (ref 70–99)
Potassium: 4.5 mmol/L (ref 3.5–5.1)
Sodium: 132 mmol/L — ABNORMAL LOW (ref 135–145)
TOTAL PROTEIN: 8 g/dL (ref 6.5–8.1)

## 2018-04-01 LAB — RAPID URINE DRUG SCREEN, HOSP PERFORMED
Amphetamines: POSITIVE — AB
BENZODIAZEPINES: NOT DETECTED
Barbiturates: NOT DETECTED
Cocaine: NOT DETECTED
Opiates: POSITIVE — AB
Tetrahydrocannabinol: NOT DETECTED

## 2018-04-01 LAB — URINALYSIS, ROUTINE W REFLEX MICROSCOPIC
BILIRUBIN URINE: NEGATIVE
Glucose, UA: NEGATIVE mg/dL
KETONES UR: NEGATIVE mg/dL
Nitrite: NEGATIVE
Protein, ur: 30 mg/dL — AB
Specific Gravity, Urine: 1.028 (ref 1.005–1.030)
pH: 5 (ref 5.0–8.0)

## 2018-04-01 LAB — ABO/RH: ABO/RH(D): O POS

## 2018-04-01 LAB — HCG, QUANTITATIVE, PREGNANCY: HCG, BETA CHAIN, QUANT, S: 139282 m[IU]/mL — AB (ref <5)

## 2018-04-01 LAB — I-STAT CG4 LACTIC ACID, ED: Lactic Acid, Venous: 0.54 mmol/L (ref 0.5–1.9)

## 2018-04-01 MED ORDER — SODIUM CHLORIDE 0.9 % IV SOLN: 1.0000 g | Freq: Once | INTRAVENOUS | Status: DC

## 2018-04-01 MED ORDER — SODIUM CHLORIDE 0.9 % IV SOLN
2.0000 g | Freq: Once | INTRAVENOUS | Status: AC
  Administered 2018-04-01: 2 g via INTRAVENOUS
  Filled 2018-04-01: qty 20

## 2018-04-01 MED ORDER — SULFAMETHOXAZOLE-TRIMETHOPRIM 800-160 MG PO TABS: 1.0000 | ORAL_TABLET | Freq: Two times a day (BID) | ORAL | 0 refills | Status: AC

## 2018-04-01 MED ORDER — SODIUM CHLORIDE 0.9 % IV BOLUS
1000.0000 mL | Freq: Once | INTRAVENOUS | Status: AC
  Administered 2018-04-01: 1000 mL via INTRAVENOUS

## 2018-04-01 MED ORDER — ONDANSETRON 4 MG PO TBDP
4.0000 mg | ORAL_TABLET | Freq: Once | ORAL | Status: AC
  Administered 2018-04-01: 4 mg via ORAL
  Filled 2018-04-01: qty 1

## 2018-04-01 NOTE — ED Triage Notes (Signed)
Per [facility]:  Patient has had two syncopal episodes, on Friday, falling forward on her face.  Patient had another syncopal episode at class today while taking a test.  States she had strong abdominal pain (patient is [redacted] weeks pregnant) and lower back pain, felt nauseated and light-headed, and then fell from sitting on to her left side from the chair.  In July patient was admitted due to endocarditis from drug use, patient denies any use since July.  Patient states she has also been struggling with an ongoing kidney infection, has finished her abx, and would like to be sure the infection is gone.  Patient BP 92/63 on EMS arrival, 120/50 on drop-off.

## 2018-04-01 NOTE — ED Notes (Signed)
This RN attempted IV x 2 without success

## 2018-04-01 NOTE — ED Notes (Signed)
Pt transported to Xray. 

## 2018-04-01 NOTE — ED Provider Notes (Signed)
[facility] EMERGENCY DEPARTMENT Provider Note   CSN: [id number] Arrival date & time: 04/01/18  1423     History   Chief Complaint Chief Complaint  Patient presents with  . Loss of Consciousness    HPI Kelly [name] is a 32 y.o. 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  She says that on Friday she was using the bathroom to urinate, was not bearing down, and after she completed she had a syncopal event for about 10 seconds.  She fell striking her face on the floor.  She has had fevers with temps up to 103 over the weekend.  She has been being treated for 3 days with Keflex for a UTI.  She reports continuing dysuria, and frequency however her urgency is improving.  She reports that for the past few days she has been having gradually worsening bilateral lower quadrant abdominal pain and back pain.  Her pain feels crampy in nature.  She denies any vaginal bleeding abnormal discharge or fluids.  She has been nauseous and vomiting without diarrhea.  She denies any seizure-like activity according to bystanders during her syncopal events.  Patient reports she ate about half an hour prior to event.  Denies dehydration.   For her endocarditis she was treated with p.o. Keflex after being in the hospital, of which she reports compliance.  She has not yet followed back up with cardiology to evaluate for clearance of infection.    She does not currently have an OB/GYN.  She reports that before she passes out she feels hot, short of breath.  She reports that she is not short of breath when walking or currently at rest, only before she passes out.  No headache.  She reports her legs are still weak from  her epidural abscess but no recent worsening, no changes to bowel or bladder function.   HPI  Past Medical History:  Diagnosis Date  . Abscess of breast    rt breast  . ADD (attention deficit disorder)   . Back pain   . Bipolar 1 disorder ([place])   . Hypertension   . Polysubstance abuse ([place])   . Suicidal overdose [facility])     Patient Active Problem List   Diagnosis Date Noted  . Other foreign body or object entering through skin, sequela 01/01/2018  . MSSA bacteremia 12/16/2017  . 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 Febrile illness   . Difficult intravenous access   . Leukocytosis   . Intravenous drug abuse ([place])   . AKI (acute kidney injury) ([place])   . Sepsis ([place]) 12/02/2017  . Facial cellulitis 08/29/2016  . Bipolar disorder ([place]) 07/31/2016  . Major depressive disorder, recurrent episode with anxious distress ([place]) 07/29/2016  . Normal labor 07/26/2016  . Foreign body granuloma of soft tissue, NEC, right upper arm   . Pyelonephritis 05/28/2016  . Polysubstance abuse ([place]) 05/25/2016  . No prenatal care in current pregnancy 05/25/2016  . Chronic hypertension affecting pregnancy 05/25/2016  . History of cesarean delivery affecting pregnancy 05/25/2016  . Community acquired pneumonia 05/25/2016  . Pyelonephritis affecting pregnancy 05/24/2016  . Palliative care encounter   .  Abscess of right breast 07/13/2014  . Opioid use disorder, severe, dependence ([place]) 08/25/2013  . Unspecified episodic mood disorder 08/25/2013  . ADHD (attention deficit hyperactivity disorder) 08/25/2013  . Cocaine abuse ([place]) 08/25/2013  . Thoracic back pain 01/29/2013  . CROHN'S DISEASE 05/03/2005  . COLONIC POLYPS 02/28/2003    Past Surgical History:  Procedure Laterality Date  . BACK SURGERY     scoliosis-in care everywhere 08/1999  . CESAREAN SECTION    . CHOLECYSTECTOMY  March 2015  .  FOREIGN BODY REMOVAL Right 05/28/2016   Procedure: REMOVAL FOREIGN BODY RIGHT ARM;  Surgeon: Meredith Pel, MD;  Location: [facility];  Service: Orthopedics;  Laterality: Right;  . IRRIGATION AND DEBRIDEMENT ABSCESS Right 07/13/2014   Procedure: IRRIGATION AND DEBRIDEMEN TRIGHT BREAST ABSCESS;  Surgeon: Donnie Mesa, MD;  Location: [facility];  Service: General;  Laterality: Right;  . LUMBAR LAMINECTOMY/DECOMPRESSION MICRODISCECTOMY N/A 12/05/2017   Procedure: LUMBAR LAMINECTOMY FOR EPIDURAL ABSCESS;  Surgeon: Newman Pies, MD;  Location: [facility];  Service: Neurosurgery;  Laterality: N/A;  . RADIOLOGY WITH ANESTHESIA N/A 12/05/2017   Procedure: MRI WITH ANESTHESIA OF THORCIC AND LUMBAR SPINE WITH AND WITHOUT;  Surgeon: Radiologist, Medication, MD;  Location: [facility];  Service: Radiology;  Laterality: N/A;     OB History    Gravida  6   Para  1   Term  1   Preterm      AB  3   Living  1     SAB  3   TAB      Ectopic      Multiple      Live Births  1            Home Medications    Prior to Admission medications   Medication Sig Start Date End Date Taking? Authorizing Provider  cephALEXin (KEFLEX) 500 MG capsule Take 1 capsule (500 mg total) by mouth 4 (four) times daily. 03/27/18  Yes Leftwich-Kirby, Kathie Dike, CNM  methadone (DOLOPHINE) 10 MG/5ML solution Take 30 mLs (60 mg total) by mouth daily. Patient taking differently: Take 80 mg by mouth daily.  01/30/18  Yes Bonnielee Haff, MD  acetaminophen (TYLENOL) 500 MG tablet Take 2 tablets (1,000 mg total) by mouth every 8 (eight) hours. Patient not taking: Reported on 04/01/2018 01/30/18   Bonnielee Haff, MD  cephALEXin (KEFLEX) 500 MG capsule Take 1 capsule (500 mg total) by mouth every 12 (twelve) hours. Patient not taking: Reported on 04/01/2018 01/30/18   Bonnielee Haff, MD  cyclobenzaprine (FLEXERIL) 10 MG tablet Take 1 tablet (10 mg total) by mouth 3 (three) times daily. Patient not taking: Reported on 04/01/2018 01/30/18    Bonnielee Haff, MD  ferrous sulfate 325 (65 FE) MG tablet Take 1 tablet (325 mg total) by mouth daily with breakfast. Patient not taking: Reported on 04/01/2018 01/31/18   Bonnielee Haff, MD  lactobacillus (FLORANEX/LACTINEX) PACK Take 1 packet (1 g total) by mouth 3 (three) times daily with meals. Patient not taking: Reported on 04/01/2018 01/30/18   Bonnielee Haff, MD  magnesium oxide (MAG-OX) 400 (241.3 Mg) MG tablet Take 1 tablet (400 mg total) by mouth 2 (two) times daily. Patient not taking: Reported on 04/01/2018 01/30/18   Bonnielee Haff, MD  nicotine polacrilex (NICORETTE) 2 MG gum Take 1 each (2 mg total) by mouth as needed for smoking cessation. Patient not taking: Reported on 04/01/2018 01/30/18   Bonnielee Haff, MD  oxyCODONE (OXY IR/ROXICODONE) 5 MG immediate release tablet Take 1-2 tablets (  5-10 mg total) by mouth every 4 (four) hours as needed for severe pain. Patient not taking: Reported on 04/01/2018 01/30/18   Bonnielee Haff, MD  pantoprazole (PROTONIX) 40 MG tablet Take 1 tablet (40 mg total) by mouth daily. Patient not taking: Reported on 04/01/2018 01/30/18   Bonnielee Haff, MD  polyethylene glycol [facility] Floria Raveling) packet Take 17 g by mouth 2 (two) times daily. Patient not taking: Reported on 04/01/2018 01/30/18   Bonnielee Haff, MD  psyllium (HYDROCIL/METAMUCIL) 95 % PACK Take 1 packet by mouth daily. Patient not taking: Reported on 04/01/2018 01/30/18   Bonnielee Haff, MD  senna-docusate (SENOKOT-S) 8.6-50 MG tablet Take 1 tablet by mouth 2 (two) times daily. Patient not taking: Reported on 04/01/2018 01/30/18   Bonnielee Haff, MD  sulfamethoxazole-trimethoprim (BACTRIM DS,SEPTRA DS) 800-160 MG tablet Take 1 tablet by mouth 2 (two) times daily for 7 days. 04/01/18 04/08/18  Lorin Glass, PA-C    Family History Family History  Problem Relation Age of Onset  . Diabetes Mother   . Diabetes Father   . Hypertension Father   . Heart attack Neg Hx   .  Hyperlipidemia Neg Hx   . Sudden death Neg Hx     Social History Social History   Tobacco Use  . Smoking status: Current Some Day Smoker    Packs/day: 1.00    Years: 13.00    Pack years: 13.00    Types: Cigarettes  . Smokeless tobacco: Never Used  Substance Use Topics  . Alcohol use: Yes    Comment: one or two drinks weekly  . Drug use: Yes    Types: Cocaine, Heroin, IV    Comment: heroin, crack cocaine     Allergies   Hydrocodone and Morphine and related   Review of Systems Review of Systems  Constitutional: Positive for chills, fatigue and fever.  HENT: Negative for congestion, sore throat and trouble swallowing.   Eyes: Negative for photophobia and visual disturbance.  Respiratory: Positive for shortness of breath (Only right before passes out. ).   Cardiovascular: Negative for chest pain.  Gastrointestinal: Positive for abdominal pain, nausea and vomiting. Negative for constipation and diarrhea.  Genitourinary: Positive for dysuria, frequency, pelvic pain and urgency. Negative for flank pain, vaginal bleeding and vaginal discharge.  Musculoskeletal: Negative for back pain and neck pain.  Neurological: Positive for syncope. Negative for dizziness, facial asymmetry, speech difficulty, weakness, light-headedness and headaches.  All other systems reviewed and are negative.    Physical Exam Updated Vital Signs BP 101/60 (BP Location: Right Wrist)   Pulse (!) 52   Temp 98.4 F (36.9 C) (Oral)   Resp 16   LMP 01/30/2018 (LMP Unknown)   SpO2 100%   Physical Exam  Constitutional: She appears well-developed and well-nourished. No distress.  HENT:  Head: Normocephalic and atraumatic. Head is without Battle's sign.  Right Ear: Tympanic membrane, external ear and ear canal normal. No mastoid tenderness. No hemotympanum.  Left Ear: Tympanic membrane, external ear and ear canal normal. No mastoid tenderness. No hemotympanum.  Nose: Nose normal.  Mouth/Throat: Uvula is  midline and oropharynx is clear and moist.  Eyes: Pupils are equal, round, and reactive to light. Conjunctivae and EOM are normal.  Ecchymosis around right eye with minimal swelling.  No subconjunctival hematoma. No APD.   Neck: Normal range of motion. Neck supple.  Cardiovascular: Normal rate, regular rhythm and intact distal pulses. Exam reveals no gallop and no friction rub.  Murmur heard. Pulmonary/Chest: Effort normal  and breath sounds normal. No respiratory distress. She has no decreased breath sounds.  Abdominal: Soft. Bowel sounds are normal. There is tenderness in the right lower quadrant, suprapubic area and left lower quadrant. There is no rigidity, no rebound, no guarding and no CVA tenderness.  Genitourinary:  Genitourinary Comments: Refused  Musculoskeletal: She exhibits no edema.  No swelling or calf TTP over bilateral lower legs.   Lymphadenopathy:    She has no cervical adenopathy.  Neurological: She is alert. She has normal strength. Coordination normal. GCS eye subscore is 4. GCS verbal subscore is 5. GCS motor subscore is 6.  5/5 strength in leg lift bilaterally and grip strength bilaterally.  No slurred speech.  Face is symetrical.  Able to have a normal conversation.  Able to text on phone with out difficulty.   Skin: Skin is warm and dry.  Psychiatric: She has a normal mood and affect.  Nursing note and vitals reviewed.    ED Treatments / Results  Labs (all labs ordered are listed, but only abnormal results are displayed) Labs Reviewed  COMPREHENSIVE METABOLIC PANEL - Abnormal; Notable for the following components:      Result Value   Sodium 132 (*)    Albumin 3.4 (*)    All other components within normal limits  URINALYSIS, ROUTINE W REFLEX MICROSCOPIC - Abnormal; Notable for the following components:   Color, Urine AMBER (*)    APPearance CLOUDY (*)    Hgb urine dipstick SMALL (*)    Protein, ur 30 (*)    Leukocytes, UA LARGE (*)    Bacteria, UA FEW (*)     Non Squamous Epithelial 0-5 (*)    All other components within normal limits  RAPID URINE DRUG SCREEN, HOSP PERFORMED - Abnormal; Notable for the following components:   Opiates POSITIVE (*)    Amphetamines POSITIVE (*)    All other components within normal limits  HCG, QUANTITATIVE, PREGNANCY - Abnormal; Notable for the following components:   hCG, Beta Chain, Quant, S 139,282 (*)    All other components within normal limits  CBC WITH DIFFERENTIAL/PLATELET - Abnormal; Notable for the following components:   WBC 13.8 (*)    Neutro Abs 11.5 (*)    All other components within normal limits  URINE CULTURE  CULTURE, BLOOD (ROUTINE X 2)  CULTURE, BLOOD (ROUTINE X 2)  CBC WITH DIFFERENTIAL/PLATELET  I-STAT CG4 LACTIC ACID, ED  ABO/RH    EKG EKG Interpretation  Date/Time:  Tuesday April 01 2018 14:34:00 EST Ventricular Rate:  70 PR Interval:    QRS Duration: 94 QT Interval:  418 QTC Calculation: 451 R Axis:   55 Text Interpretation:  Sinus rhythm No significant change since last tracing Borderline ECG Confirmed by Carmin Muskrat [phone]) on 04/01/2018 2:48:52 PM   Radiology Dg Chest 2 View  Result Date: 04/01/2018 CLINICAL DATA:  Syncopal episode. First trimester pregnancy. Kidney infection. EXAM: CHEST - 2 VIEW COMPARISON:  Chest radiograph December 10, 2017 FINDINGS: Cardiomediastinal silhouette is normal. No pleural effusions or focal consolidations. RIGHT mid lung zone scarring. Trachea projects midline and there is no pneumothorax. Faint nodular density LEFT upper lobe associated with EKG tip. Soft tissue planes and included osseous structures are non-suspicious. Harrington rods. IMPRESSION: No active cardiopulmonary disease. Electronically Signed   By: Elon Alas M.D.   On: 04/01/2018 17:21   US Ob Comp < 14 Wks  Result Date: 04/01/2018 CLINICAL DATA:  Syncope.  A pelvic cramping.  Pregnant patient. EXAM: OBSTETRIC <  51 WK ULTRASOUND TECHNIQUE: Transabdominal  ultrasound was performed for evaluation of the gestation as well as the maternal uterus and adnexal regions. COMPARISON:  None. FINDINGS: Intrauterine gestational sac: Single Yolk sac:  Visualized. Embryo:  Visualized. Cardiac Activity: Visualized. Heart Rate: 169 bpm CRL: 26.3 mm   9 w 3 d                  [place] EDC: Subchorionic hemorrhage:  None visualized. Maternal uterus/adnexae: Normal. IMPRESSION: Single live IUP. Electronically Signed   By: Dorise Bullion III M.D   On: 04/01/2018 20:36    Procedures Procedures (including critical care time)  Medications Ordered in ED Medications  sodium chloride 0.9 % bolus 1,000 mL (0 mLs Intravenous Stopped 04/01/18 1813)  ondansetron (ZOFRAN-ODT) disintegrating tablet 4 mg (4 mg Oral Given 04/01/18 1454)  cefTRIAXone (ROCEPHIN) 2 g in sodium chloride 0.9 % 100 mL IVPB (0 g Intravenous Stopped 04/01/18 2307)     Initial Impression / Assessment and Plan / ED Course  I have reviewed the triage vital signs and the nursing notes.  Pertinent labs & imaging results that were available during my care of the patient were reviewed by me and considered in my medical decision making (see chart for details).  Clinical Course as of Apr 01 2336  Tue Apr 01, 2018  2114 Spoke with Dr. Nehemiah Settle from OB/Gyn who recommends Bactrim BID for 7 days and 2g of rocephin instead of 1g   [EH]    Clinical Course User Index [EH] Lorin Glass, PA-C   [name] presents today for evaluation of a syncopal event.  This is her second syncopal event in the past week.  She has had fevers over the weekend.  She has been treated with Keflex for a urinary tract infection and has been taking 500 mg 4 times a day for the past 3 days without significant improvement.  Labs are obtained and reviewed.  Patient has not had any antipyretics today and is afebrile, not tachycardic or tachypneic.  Normal blood pressure.  She has a slightly elevated white count at 13.8.  She reports  pelvic cramping.  HCG elevated at 139,000.  Remainder of labs were normal.  Lactic is not elevated.  Blood cultures were obtained due to her history of endocarditis.  Lactic acid was not elevated.  It was given fluid bolus after which she felt better.  Urine showed cloudy with small blood, 30 protein, large leukocytes, consistent with continued infection.  Urine culture was again sent.  Patient was observed on telemetry monitoring without repeat syncopal events.  Patient's syncopal events were not related to position changes so orthostatics not obtained.  Importance of following up with her cardiology team was stressed to patient, and she states her understanding.  Spoke with Dr. Nehemiah Settle from OB/GYN given her failure to improve with p.o. antibiotics.  He recommended Bactrim twice daily for 7 days in addition to 2 g of Rocephin IV.  Patient reported pelvic pain and cramping, ultrasound was obtained showing a single intrauterine pregnancy approximately 9 weeks 3 days.  Patient refused pelvic exam.    Return precautions were discussed with patient who states their understanding.  She was given syncope precautions including instructed not to drive until she obtains follow-up.  At the time of discharge patient denied any unaddressed complaints or concerns.  Patient is agreeable for discharge home.   Final Clinical Impressions(s) / ED Diagnoses   Final diagnoses:  Syncope and collapse  Urinary  tract infection in mother during first trimester of pregnancy    ED Discharge Orders         Ordered    sulfamethoxazole-trimethoprim (BACTRIM DS,SEPTRA DS) 800-160 MG tablet  2 times daily     04/01/18 2335           Lorin Glass, PA-C 04/01/18 2348    Dorie Rank, MD 04/04/18 1500

## 2018-04-01 NOTE — Discharge Instructions (Addendum)
You may have diarrhea from the antibiotics.  It is very important that you continue to take the antibiotics even if you get diarrhea unless a medical professional tells you that you may stop taking them.  If you stop too early the bacteria you are being treated for will become stronger and you may need different, more powerful antibiotics that have more side effects and worsening diarrhea.  Please stay well hydrated and consider probiotics as they may decrease the severity of your diarrhea.    Please do not drive or operate heavy machinery due to your history of passing out.  Please avoid any activities where you could cause harm to yourself or anyone else if you were to pass out.  This includes driving, swimming, bathing.  Please follow up with cardiology.  You need to follow up for your evaluation with the heart doctors from your heart infection.  If you pass out again please seek additional medical care.    Please make sure you are taking your prenatal vitamins.

## 2018-04-01 NOTE — ED Notes (Signed)
[place] tech informed this RN that she was awaiting the Beta HCG results.  It is now resulted and pt is next in line for [place]. Pt updated on POC.

## 2018-04-01 NOTE — ED Notes (Signed)
Radiology informed that pt is now ready for CXR.

## 2018-04-02 LAB — URINE CULTURE: Culture: <10000 — AB

## 2018-04-06 LAB — CULTURE, BLOOD (ROUTINE X 2)
CULTURE: NO GROWTH
Culture: NO GROWTH

## 2018-04-08 ENCOUNTER — Encounter

## 2018-04-08 ENCOUNTER — Ambulatory Visit: Payer: Medicaid Other | Admitting: Cardiovascular Disease

## 2018-05-08 ENCOUNTER — Inpatient Hospital Stay (HOSPITAL_COMMUNITY)
Admission: AD | Admit: 2018-05-08 | Discharge: 2018-05-08 | Disposition: A | Discharge status: Home / Self Care | Payer: Medicaid Other | Attending: Obstetrics & Gynecology | Admitting: Obstetrics & Gynecology

## 2018-05-08 ENCOUNTER — Encounter (HOSPITAL_COMMUNITY): Payer: Self-pay | Admitting: *Deleted

## 2018-05-08 DIAGNOSIS — O2342 Unspecified infection of urinary tract in pregnancy, second trimester: Secondary | ICD-10-CM | POA: Insufficient documentation

## 2018-05-08 DIAGNOSIS — O99332 Smoking (tobacco) complicating pregnancy, second trimester: Secondary | ICD-10-CM | POA: Diagnosis not present

## 2018-05-08 DIAGNOSIS — R319 Hematuria, unspecified: Secondary | ICD-10-CM

## 2018-05-08 DIAGNOSIS — O26892 Other specified pregnancy related conditions, second trimester: Secondary | ICD-10-CM

## 2018-05-08 DIAGNOSIS — R109 Unspecified abdominal pain: Secondary | ICD-10-CM | POA: Diagnosis present

## 2018-05-08 DIAGNOSIS — F17210 Nicotine dependence, cigarettes, uncomplicated: Secondary | ICD-10-CM | POA: Insufficient documentation

## 2018-05-08 DIAGNOSIS — O99322 Drug use complicating pregnancy, second trimester: Secondary | ICD-10-CM | POA: Diagnosis not present

## 2018-05-08 DIAGNOSIS — N390 Urinary tract infection, site not specified: Secondary | ICD-10-CM | POA: Diagnosis not present

## 2018-05-08 DIAGNOSIS — F1410 Cocaine abuse, uncomplicated: Secondary | ICD-10-CM | POA: Diagnosis not present

## 2018-05-08 DIAGNOSIS — Z3A14 14 weeks gestation of pregnancy: Secondary | ICD-10-CM

## 2018-05-08 DIAGNOSIS — F1120 Opioid dependence, uncomplicated: Secondary | ICD-10-CM | POA: Diagnosis not present

## 2018-05-08 DIAGNOSIS — F1990 Other psychoactive substance use, unspecified, uncomplicated: Secondary | ICD-10-CM

## 2018-05-08 HISTORY — DX: Sepsis, unspecified organism: A41.9

## 2018-05-08 HISTORY — DX: Elevated white blood cell count, unspecified: D72.829

## 2018-05-08 HISTORY — DX: Intraspinal abscess and granuloma: G06.1

## 2018-05-08 LAB — URINALYSIS, ROUTINE W REFLEX MICROSCOPIC
GLUCOSE, UA: NEGATIVE mg/dL
KETONES UR: NEGATIVE mg/dL
Nitrite: POSITIVE — AB
PROTEIN: 30 mg/dL — AB
Specific Gravity, Urine: 1.025 (ref 1.005–1.030)
pH: 5 (ref 5.0–8.0)

## 2018-05-08 LAB — CBC
HEMATOCRIT: 30.2 % — AB (ref 36.0–46.0)
HEMOGLOBIN: 9.5 g/dL — AB (ref 12.0–15.0)
MCH: 27.6 pg (ref 26.0–34.0)
MCHC: 31.5 g/dL (ref 30.0–36.0)
MCV: 87.8 fL (ref 80.0–100.0)
NRBC: 0 % (ref 0.0–0.2)
Platelets: 351 10*3/uL (ref 150–400)
RBC: 3.44 MIL/uL — AB (ref 3.87–5.11)
RDW: 17.2 % — ABNORMAL HIGH (ref 11.5–15.5)
WBC: 13.1 10*3/uL — AB (ref 4.0–10.5)

## 2018-05-08 MED ORDER — CEPHALEXIN 500 MG PO CAPS: 500.0000 mg | ORAL_CAPSULE | Freq: Four times a day (QID) | ORAL | 0 refills | Status: AC

## 2018-05-08 MED ORDER — PROMETHAZINE HCL 25 MG PO TABS: 25.0000 mg | ORAL_TABLET | Freq: Four times a day (QID) | ORAL | 0 refills | Status: DC | PRN

## 2018-05-08 MED ORDER — SODIUM CHLORIDE 0.9 % IV SOLN
2.0000 g | Freq: Once | INTRAVENOUS | Status: AC
  Administered 2018-05-08: 2 g via INTRAVENOUS
  Filled 2018-05-08: qty 20

## 2018-05-08 MED ORDER — LACTATED RINGERS IV SOLN
INTRAVENOUS | Status: DC
  Administered 2018-05-08: 13:00:00 via INTRAVENOUS

## 2018-05-08 NOTE — MAU Note (Signed)
Pt reports she was treated for a UTI 2 weeks ago and finished the antibiotics but started having pain in her right side and right mid back one week ago, pain is worsening. Some blood in her urine the other day.

## 2018-05-08 NOTE — MAU Provider Note (Signed)
History     CSN: [id number]  Arrival date and time: 05/08/18 1115   First Provider Initiated Contact with Patient 05/08/18 1227      Chief Complaint  Patient presents with  . Abdominal Pain  . Back Pain   HPI Kelly [name] 32 y.o. [redacted]w[redacted]d  Here today with symptoms again of a UTI. Saw blood in her urine and has lower abdominal pain and back pain. States she was on methadone but then stopped and has been using IV Heroin - last on yesterday.  Is afraid she is going through withdrawal.  Has plans to restart Methadone on Monday.  Is staying with a non IV drug user tonight. Has not yet started prenatal care.  Has a complicated history with hospitalization for 2 months due to endocarditis and MSSA absesses - discharge note reviewed.  Did not keep her follow up appointment with cardiology.  OB History    Gravida  6   Para  2   Term  1   Preterm      AB  3   Living  2     SAB  3   TAB      Ectopic      Multiple      Live Births  2           Past Medical History:  Diagnosis Date  . 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 Abscess of breast    rt breast  . ADD (attention deficit disorder)   . Back pain   . Bipolar 1 disorder ([place]   . Hypertension   . Leukocytosis   . Polysubstance abuse ([facility]   . Sepsis ([facility] 12/02/2017  . Suicidal overdose ([name]    Patient Active Problem List   Diagnosis Date Noted  . 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 Difficult intravenous access   . Intravenous drug abuse ([place]   . AKI (acute kidney injury) ([employer]   . Bipolar disorder ([facility] 07/31/2016  . Major depressive disorder, recurrent episode with anxious distress ([place] 07/29/2016  . Normal labor 07/26/2016  . Foreign body granuloma of soft tissue, NEC, right upper arm   . Pyelonephritis 05/28/2016  . Polysubstance abuse ([facility] 05/25/2016  . No prenatal care in current pregnancy 05/25/2016  . Chronic hypertension  affecting pregnancy 05/25/2016  . History of cesarean delivery affecting pregnancy 05/25/2016  . Community acquired pneumonia 05/25/2016  . Pyelonephritis affecting pregnancy 05/24/2016  . Palliative care encounter   . Abscess of right breast 07/13/2014  . Opioid use disorder, severe, dependence ([place] 08/25/2013  . Unspecified episodic mood disorder 08/25/2013  . ADHD (attention deficit hyperactivity disorder) 08/25/2013  . Cocaine abuse ([place] 08/25/2013  . Thoracic back pain 01/29/2013  . CROHN'S DISEASE 05/03/2005  . COLONIC POLYPS 02/28/2003    Past Surgical History:  Procedure Laterality Date  . BACK SURGERY     scoliosis-in care everywhere 08/1999  . CESAREAN SECTION    . CHOLECYSTECTOMY  March 2015  . FOREIGN BODY REMOVAL Right 05/28/2016   Procedure: REMOVAL FOREIGN BODY RIGHT ARM;  Surgeon: DMeredith Pel MD;  [place]  Service: Orthopedics;  Laterality: Right;  . IRRIGATION AND DEBRIDEMENT ABSCESS Right 07/13/2014   Procedure: IRRIGATION AND DEBRIDEMEN TRIGHT BREAST ABSCESS;  Surgeon: MDonnie Mesa MD;  [place]  Service: General;  Laterality: Right;  . LUMBAR LAMINECTOMY/DECOMPRESSION MICRODISCECTOMY N/A 12/05/2017   Procedure: LUMBAR LAMINECTOMY FOR EPIDURAL ABSCESS;  Surgeon: Newman Pies, MD;  Location: [place];  Service: Neurosurgery;  Laterality: N/A;  . RADIOLOGY WITH ANESTHESIA N/A 12/05/2017   Procedure: MRI WITH ANESTHESIA OF THORCIC AND LUMBAR SPINE WITH AND WITHOUT;  Surgeon: Radiologist, Medication, MD;  Location: [place];  Service: Radiology;  Laterality: N/A;    Family History  Problem Relation Age of Onset  . Diabetes Mother   . Diabetes Father   . Hypertension Father   . Heart attack Neg Hx   . Hyperlipidemia Neg Hx   . Sudden death Neg Hx     Social History   Tobacco Use  . Smoking status: Current Some Day Smoker    Packs/day: 1.00    Years: 13.00    Pack years: 13.00    Types: Cigarettes  . Smokeless tobacco: Never Used   Substance Use Topics  . Alcohol use: Yes    Comment: one or two drinks weekly  . Drug use: Yes    Types: Cocaine, Heroin, IV    Comment: heroin, crack cocaine    Allergies:  Allergies  Allergen Reactions  . Hydrocodone Itching  . Morphine And Related Nausea And Vomiting    Medications Prior to Admission  Medication Sig Dispense Refill Last Dose  . acetaminophen (TYLENOL) 500 MG tablet Take 2 tablets (1,000 mg total) by mouth every 8 (eight) hours. (Patient not taking: Reported on 04/01/2018) 30 tablet 0 Not Taking at Unknown time  . cyclobenzaprine (FLEXERIL) 10 MG tablet Take 1 tablet (10 mg total) by mouth 3 (three) times daily. (Patient not taking: Reported on 04/01/2018) 90 tablet 0 Not Taking at Unknown time  . ferrous sulfate 325 (65 FE) MG tablet Take 1 tablet (325 mg total) by mouth daily with breakfast. (Patient not taking: Reported on 04/01/2018) 30 tablet 0 Not Taking at Unknown time  . lactobacillus (FLORANEX/LACTINEX) PACK Take 1 packet (1 g total) by mouth 3 (three) times daily with meals. (Patient not taking: Reported on 04/01/2018) 90 packet 0 Not Taking at Unknown time  . magnesium oxide (MAG-OX) 400 (241.3 Mg) MG tablet Take 1 tablet (400 mg total) by mouth 2 (two) times daily. (Patient not taking: Reported on 04/01/2018) 60 tablet 0 Not Taking at Unknown time  . methadone (DOLOPHINE) 10 MG/5ML solution Take 30 mLs (60 mg total) by mouth daily. (Patient not taking: Reported on 05/08/2018)  0 Not Taking at Unknown time  . nicotine polacrilex (NICORETTE) 2 MG gum Take 1 each (2 mg total) by mouth as needed for smoking cessation. (Patient not taking: Reported on 04/01/2018) 100 tablet 0 Not Taking at Unknown time  . oxyCODONE (OXY IR/ROXICODONE) 5 MG immediate release tablet Take 1-2 tablets (5-10 mg total) by mouth every 4 (four) hours as needed for severe pain. (Patient not taking: Reported on 04/01/2018) 90 tablet 0 Not Taking at Unknown time  . pantoprazole (PROTONIX) 40  MG tablet Take 1 tablet (40 mg total) by mouth daily. (Patient not taking: Reported on 04/01/2018) 30 tablet 0 Not Taking at Unknown time  . polyethylene glycol (MIRALAX / GLYCOLAX) packet Take 17 g by mouth 2 (two) times daily. (Patient not taking: Reported on 04/01/2018) 60 each 0 Not Taking at Unknown time  . psyllium (HYDROCIL/METAMUCIL) 95 % PACK Take 1 packet by mouth daily. (Patient not taking: Reported on 05/08/2018) 240 each 0 Not Taking at Unknown time  . senna-docusate (SENOKOT-S) 8.6-50 MG tablet Take 1 tablet by mouth 2 (two) times daily. (Patient not taking: Reported on 04/01/2018) 60 tablet  0 Not Taking at Unknown time    Review of Systems  Constitutional: Positive for diaphoresis. Negative for fever.  Gastrointestinal: Positive for abdominal pain and nausea. Negative for vomiting.  Genitourinary: Positive for dysuria and flank pain. Negative for vaginal bleeding and vaginal discharge.  Musculoskeletal:       Achy all over   Physical Exam   Blood pressure 107/66, pulse 82, temperature 98 F (36.7 C), temperature source Oral, resp. rate 17, height 5' 7"  (1.702 m), weight 79.4 kg, last menstrual period 01/30/2018, SpO2 97 %, unknown if currently breastfeeding.  Physical Exam  Nursing note and vitals reviewed. Constitutional: She is oriented to person, place, and time. She appears well-developed and well-nourished.  Back of gown is damp although no actual sweating seen.  Face seems shiny but no drops of sweat.  HENT:  Head: Normocephalic.  Eyes: EOM are normal.  Neck: Neck supple.  GI: Soft. There is no abdominal tenderness. There is no rebound and no guarding.  FHT heard with doppler - 150  Musculoskeletal: Normal range of motion.     Comments: No CVA tenderness  Neurological: She is alert and oriented to person, place, and time.  Skin: Skin is warm and dry.  Track marks noted on both arms.  Bruising on thighs.  Psychiatric: She has a normal mood and affect.    MAU  Course  Procedures Results for orders placed or performed during the hospital encounter of 05/08/18 (from the past 24 hour(s))  Urinalysis, Routine w reflex microscopic     Status: Abnormal   Collection Time: 05/08/18 11:37 AM  Result Value Ref Range   Color, Urine AMBER (A) YELLOW   APPearance HAZY (A) CLEAR   Specific Gravity, Urine 1.025 1.005 - 1.030   pH 5.0 5.0 - 8.0   Glucose, UA NEGATIVE NEGATIVE mg/dL   Hgb urine dipstick MODERATE (A) NEGATIVE   Bilirubin Urine MODERATE (A) NEGATIVE   Ketones, ur NEGATIVE NEGATIVE mg/dL   Protein, ur 30 (A) NEGATIVE mg/dL   Nitrite POSITIVE (A) NEGATIVE   Leukocytes, UA SMALL (A) NEGATIVE   RBC / HPF 6-10 0 - 5 RBC/hpf   WBC, UA 21-50 0 - 5 WBC/hpf   Bacteria, UA RARE (A) NONE SEEN   Squamous Epithelial / LPF 0-5 0 - 5   Mucus PRESENT   CBC     Status: Abnormal   Collection Time: 05/08/18  1:06 PM  Result Value Ref Range   WBC 13.1 (H) 4.0 - 10.5 K/uL   RBC 3.44 (L) 3.87 - 5.11 MIL/uL   Hemoglobin 9.5 (L) 12.0 - 15.0 g/dL   HCT 30.2 (L) 36.0 - 46.0 %   MCV 87.8 80.0 - 100.0 fL   MCH 27.6 26.0 - 34.0 pg   MCHC 31.5 30.0 - 36.0 g/dL   RDW 17.2 (H) 11.5 - 15.5 %   Platelets 351 150 - 400 K/uL   nRBC 0.0 0.0 - 0.2 %    MDM Consult with Dr. Ihor Dow as client was requesting one dose of Methadone.  Reviewed her past medical history.  Provider unable to prescribe Methadone today - not in a program currently and is not hospitalized.  Explained to client.  Assessment and Plan  UTI - Rocephin IV given IV drug use  Possible drug withdrawal  Plan Need to start prenatal care although does not have a working phone to call her.  Needs to see Dannielle Burn or Constant in [facility] clinic  Or other clinic if Insurance  can be verified.  Was able to schedule at [facility] on January 15 at 1 pm. Be sure to start Methadone clinic again on Monday. Go to [facility] if your withdrawal symptoms worsen. Will prescribe Phenergan for her nausea  and gave verbal instructions to use vaginally if needed for vomiting.  Akiah Bauch L Karsen Nakanishi 05/08/2018, 3:17 PM

## 2018-05-08 NOTE — Discharge Instructions (Signed)
Restart Methadone as you indicated on Monday. Do not use IV drugs - you can die and/or your baby could die Your appointment to begin prenatal care is January 15 at 1 pm at the [facility] for [employer] [facility]. If your withdrawal gets worse and you need medical care, you need to be seen at [facility] ER.

## 2018-05-10 LAB — CULTURE, OB URINE: Culture: >=100000 — AB

## 2018-05-14 DIAGNOSIS — I38 Endocarditis, valve unspecified: Secondary | ICD-10-CM

## 2018-05-14 HISTORY — DX: Endocarditis, valve unspecified: I38

## 2018-05-22 ENCOUNTER — Encounter: Payer: Self-pay | Admitting: Certified Nurse Midwife

## 2018-05-28 ENCOUNTER — Encounter: Payer: Medicaid Other | Admitting: Obstetrics and Gynecology

## 2018-06-14 ENCOUNTER — Emergency Department (HOSPITAL_COMMUNITY): Payer: Medicaid Other

## 2018-06-14 ENCOUNTER — Inpatient Hospital Stay (HOSPITAL_COMMUNITY)
Admission: EM | Admit: 2018-06-14 | Discharge: 2018-08-04 | DRG: 817 | Disposition: A | Discharge status: Home / Self Care | Payer: Medicaid Other | Attending: Internal Medicine | Admitting: Internal Medicine

## 2018-06-14 ENCOUNTER — Encounter (HOSPITAL_COMMUNITY): Payer: Self-pay | Admitting: Emergency Medicine

## 2018-06-14 ENCOUNTER — Other Ambulatory Visit: Payer: Self-pay

## 2018-06-14 DIAGNOSIS — I330 Acute and subacute infective endocarditis: Secondary | ICD-10-CM | POA: Diagnosis present

## 2018-06-14 DIAGNOSIS — D509 Iron deficiency anemia, unspecified: Secondary | ICD-10-CM | POA: Diagnosis present

## 2018-06-14 DIAGNOSIS — R140 Abdominal distension (gaseous): Secondary | ICD-10-CM

## 2018-06-14 DIAGNOSIS — R740 Nonspecific elevation of levels of transaminase and lactic acid dehydrogenase [LDH]: Secondary | ICD-10-CM | POA: Diagnosis present

## 2018-06-14 DIAGNOSIS — F17210 Nicotine dependence, cigarettes, uncomplicated: Secondary | ICD-10-CM | POA: Diagnosis present

## 2018-06-14 DIAGNOSIS — M545 Low back pain, unspecified: Secondary | ICD-10-CM

## 2018-06-14 DIAGNOSIS — Z8679 Personal history of other diseases of the circulatory system: Secondary | ICD-10-CM

## 2018-06-14 DIAGNOSIS — O99612 Diseases of the digestive system complicating pregnancy, second trimester: Secondary | ICD-10-CM | POA: Diagnosis present

## 2018-06-14 DIAGNOSIS — Z0489 Encounter for examination and observation for other specified reasons: Secondary | ICD-10-CM

## 2018-06-14 DIAGNOSIS — Z452 Encounter for adjustment and management of vascular access device: Secondary | ICD-10-CM

## 2018-06-14 DIAGNOSIS — Z8249 Family history of ischemic heart disease and other diseases of the circulatory system: Secondary | ICD-10-CM

## 2018-06-14 DIAGNOSIS — O212 Late vomiting of pregnancy: Secondary | ICD-10-CM | POA: Diagnosis present

## 2018-06-14 DIAGNOSIS — O9989 Other specified diseases and conditions complicating pregnancy, childbirth and the puerperium: Secondary | ICD-10-CM

## 2018-06-14 DIAGNOSIS — M4726 Other spondylosis with radiculopathy, lumbar region: Secondary | ICD-10-CM | POA: Diagnosis not present

## 2018-06-14 DIAGNOSIS — K5909 Other constipation: Secondary | ICD-10-CM | POA: Diagnosis not present

## 2018-06-14 DIAGNOSIS — O09892 Supervision of other high risk pregnancies, second trimester: Secondary | ICD-10-CM

## 2018-06-14 DIAGNOSIS — J189 Pneumonia, unspecified organism: Secondary | ICD-10-CM | POA: Diagnosis not present

## 2018-06-14 DIAGNOSIS — K0401 Reversible pulpitis: Secondary | ICD-10-CM | POA: Diagnosis present

## 2018-06-14 DIAGNOSIS — F1910 Other psychoactive substance abuse, uncomplicated: Secondary | ICD-10-CM

## 2018-06-14 DIAGNOSIS — M47816 Spondylosis without myelopathy or radiculopathy, lumbar region: Secondary | ICD-10-CM | POA: Diagnosis present

## 2018-06-14 DIAGNOSIS — Z283 Underimmunization status: Secondary | ICD-10-CM

## 2018-06-14 DIAGNOSIS — B952 Enterococcus as the cause of diseases classified elsewhere: Secondary | ICD-10-CM | POA: Diagnosis present

## 2018-06-14 DIAGNOSIS — Z3A20 20 weeks gestation of pregnancy: Secondary | ICD-10-CM

## 2018-06-14 DIAGNOSIS — J1008 Influenza due to other identified influenza virus with other specified pneumonia: Secondary | ICD-10-CM | POA: Diagnosis present

## 2018-06-14 DIAGNOSIS — E871 Hypo-osmolality and hyponatremia: Secondary | ICD-10-CM | POA: Diagnosis present

## 2018-06-14 DIAGNOSIS — R739 Hyperglycemia, unspecified: Secondary | ICD-10-CM | POA: Diagnosis not present

## 2018-06-14 DIAGNOSIS — R7401 Elevation of levels of liver transaminase levels: Secondary | ICD-10-CM | POA: Diagnosis present

## 2018-06-14 DIAGNOSIS — L7632 Postprocedural hematoma of skin and subcutaneous tissue following other procedure: Secondary | ICD-10-CM | POA: Diagnosis not present

## 2018-06-14 DIAGNOSIS — O09899 Supervision of other high risk pregnancies, unspecified trimester: Secondary | ICD-10-CM

## 2018-06-14 DIAGNOSIS — O10919 Unspecified pre-existing hypertension complicating pregnancy, unspecified trimester: Secondary | ICD-10-CM | POA: Diagnosis present

## 2018-06-14 DIAGNOSIS — O99512 Diseases of the respiratory system complicating pregnancy, second trimester: Secondary | ICD-10-CM | POA: Diagnosis present

## 2018-06-14 DIAGNOSIS — I079 Rheumatic tricuspid valve disease, unspecified: Secondary | ICD-10-CM

## 2018-06-14 DIAGNOSIS — B9561 Methicillin susceptible Staphylococcus aureus infection as the cause of diseases classified elsewhere: Secondary | ICD-10-CM

## 2018-06-14 DIAGNOSIS — O99412 Diseases of the circulatory system complicating pregnancy, second trimester: Secondary | ICD-10-CM | POA: Diagnosis present

## 2018-06-14 DIAGNOSIS — O99352 Diseases of the nervous system complicating pregnancy, second trimester: Secondary | ICD-10-CM | POA: Diagnosis present

## 2018-06-14 DIAGNOSIS — I38 Endocarditis, valve unspecified: Secondary | ICD-10-CM

## 2018-06-14 DIAGNOSIS — IMO0002 Reserved for concepts with insufficient information to code with codable children: Secondary | ICD-10-CM

## 2018-06-14 DIAGNOSIS — K029 Dental caries, unspecified: Secondary | ICD-10-CM | POA: Diagnosis present

## 2018-06-14 DIAGNOSIS — F1120 Opioid dependence, uncomplicated: Secondary | ICD-10-CM | POA: Diagnosis present

## 2018-06-14 DIAGNOSIS — O09893 Supervision of other high risk pregnancies, third trimester: Secondary | ICD-10-CM

## 2018-06-14 DIAGNOSIS — B9620 Unspecified Escherichia coli [E. coli] as the cause of diseases classified elsewhere: Secondary | ICD-10-CM | POA: Diagnosis present

## 2018-06-14 DIAGNOSIS — F1990 Other psychoactive substance use, unspecified, uncomplicated: Secondary | ICD-10-CM

## 2018-06-14 DIAGNOSIS — Y92239 Unspecified place in hospital as the place of occurrence of the external cause: Secondary | ICD-10-CM | POA: Diagnosis not present

## 2018-06-14 DIAGNOSIS — O26892 Other specified pregnancy related conditions, second trimester: Secondary | ICD-10-CM | POA: Diagnosis present

## 2018-06-14 DIAGNOSIS — Z3689 Encounter for other specified antenatal screening: Secondary | ICD-10-CM

## 2018-06-14 DIAGNOSIS — O99332 Smoking (tobacco) complicating pregnancy, second trimester: Secondary | ICD-10-CM | POA: Diagnosis present

## 2018-06-14 DIAGNOSIS — J101 Influenza due to other identified influenza virus with other respiratory manifestations: Secondary | ICD-10-CM | POA: Diagnosis present

## 2018-06-14 DIAGNOSIS — B379 Candidiasis, unspecified: Secondary | ICD-10-CM | POA: Diagnosis not present

## 2018-06-14 DIAGNOSIS — O99322 Drug use complicating pregnancy, second trimester: Secondary | ICD-10-CM | POA: Diagnosis present

## 2018-06-14 DIAGNOSIS — A419 Sepsis, unspecified organism: Secondary | ICD-10-CM | POA: Diagnosis present

## 2018-06-14 DIAGNOSIS — F1123 Opioid dependence with withdrawal: Secondary | ICD-10-CM | POA: Diagnosis not present

## 2018-06-14 DIAGNOSIS — M48061 Spinal stenosis, lumbar region without neurogenic claudication: Secondary | ICD-10-CM | POA: Diagnosis present

## 2018-06-14 DIAGNOSIS — A491 Streptococcal infection, unspecified site: Secondary | ICD-10-CM | POA: Diagnosis present

## 2018-06-14 DIAGNOSIS — O98812 Other maternal infectious and parasitic diseases complicating pregnancy, second trimester: Principal | ICD-10-CM | POA: Diagnosis present

## 2018-06-14 DIAGNOSIS — X58XXXA Exposure to other specified factors, initial encounter: Secondary | ICD-10-CM | POA: Diagnosis not present

## 2018-06-14 DIAGNOSIS — O99012 Anemia complicating pregnancy, second trimester: Secondary | ICD-10-CM | POA: Diagnosis present

## 2018-06-14 DIAGNOSIS — Z885 Allergy status to narcotic agent status: Secondary | ICD-10-CM

## 2018-06-14 DIAGNOSIS — I071 Rheumatic tricuspid insufficiency: Secondary | ICD-10-CM | POA: Diagnosis present

## 2018-06-14 DIAGNOSIS — O2342 Unspecified infection of urinary tract in pregnancy, second trimester: Secondary | ICD-10-CM | POA: Diagnosis present

## 2018-06-14 DIAGNOSIS — K045 Chronic apical periodontitis: Secondary | ICD-10-CM | POA: Diagnosis present

## 2018-06-14 DIAGNOSIS — T45515A Adverse effect of anticoagulants, initial encounter: Secondary | ICD-10-CM | POA: Diagnosis not present

## 2018-06-14 DIAGNOSIS — I2690 Septic pulmonary embolism without acute cor pulmonale: Secondary | ICD-10-CM | POA: Diagnosis present

## 2018-06-14 DIAGNOSIS — A409 Streptococcal sepsis, unspecified: Secondary | ICD-10-CM | POA: Diagnosis present

## 2018-06-14 DIAGNOSIS — T148XXA Other injury of unspecified body region, initial encounter: Secondary | ICD-10-CM

## 2018-06-14 DIAGNOSIS — Z915 Personal history of self-harm: Secondary | ICD-10-CM

## 2018-06-14 DIAGNOSIS — O99342 Other mental disorders complicating pregnancy, second trimester: Secondary | ICD-10-CM | POA: Diagnosis present

## 2018-06-14 DIAGNOSIS — F419 Anxiety disorder, unspecified: Secondary | ICD-10-CM | POA: Diagnosis present

## 2018-06-14 DIAGNOSIS — F1510 Other stimulant abuse, uncomplicated: Secondary | ICD-10-CM | POA: Diagnosis present

## 2018-06-14 DIAGNOSIS — G4700 Insomnia, unspecified: Secondary | ICD-10-CM | POA: Diagnosis present

## 2018-06-14 DIAGNOSIS — O99282 Endocrine, nutritional and metabolic diseases complicating pregnancy, second trimester: Secondary | ICD-10-CM | POA: Diagnosis present

## 2018-06-14 DIAGNOSIS — Z9049 Acquired absence of other specified parts of digestive tract: Secondary | ICD-10-CM

## 2018-06-14 DIAGNOSIS — O10912 Unspecified pre-existing hypertension complicating pregnancy, second trimester: Secondary | ICD-10-CM | POA: Diagnosis present

## 2018-06-14 DIAGNOSIS — F40232 Fear of other medical care: Secondary | ICD-10-CM | POA: Diagnosis present

## 2018-06-14 DIAGNOSIS — K219 Gastro-esophageal reflux disease without esophagitis: Secondary | ICD-10-CM | POA: Diagnosis present

## 2018-06-14 DIAGNOSIS — O3680X0 Pregnancy with inconclusive fetal viability, not applicable or unspecified: Secondary | ICD-10-CM

## 2018-06-14 DIAGNOSIS — Z833 Family history of diabetes mellitus: Secondary | ICD-10-CM

## 2018-06-14 DIAGNOSIS — R7881 Bacteremia: Secondary | ICD-10-CM

## 2018-06-14 DIAGNOSIS — F319 Bipolar disorder, unspecified: Secondary | ICD-10-CM | POA: Diagnosis present

## 2018-06-14 HISTORY — DX: Pneumonia, unspecified organism: J18.9

## 2018-06-14 LAB — URINALYSIS, ROUTINE W REFLEX MICROSCOPIC
Bilirubin Urine: NEGATIVE
Glucose, UA: 50 mg/dL — AB
Ketones, ur: NEGATIVE mg/dL
Nitrite: NEGATIVE
Protein, ur: NEGATIVE mg/dL
SPECIFIC GRAVITY, URINE: 1.009 (ref 1.005–1.030)
pH: 7 (ref 5.0–8.0)

## 2018-06-14 LAB — COMPREHENSIVE METABOLIC PANEL
ALBUMIN: 2.3 g/dL — AB (ref 3.5–5.0)
ALT: 21 U/L (ref 0–44)
ANION GAP: 15 (ref 5–15)
AST: 269 U/L — ABNORMAL HIGH (ref 15–41)
Alkaline Phosphatase: 202 U/L — ABNORMAL HIGH (ref 38–126)
BUN: 6 mg/dL (ref 6–20)
CO2: 21 mmol/L — ABNORMAL LOW (ref 22–32)
Calcium: 8.9 mg/dL (ref 8.9–10.3)
Chloride: 91 mmol/L — ABNORMAL LOW (ref 98–111)
Creatinine, Ser: 0.62 mg/dL (ref 0.44–1.00)
GFR calc Af Amer: >60 mL/min (ref >60)
GFR calc non Af Amer: >60 mL/min (ref >60)
Glucose, Bld: 128 mg/dL — ABNORMAL HIGH (ref 70–99)
Potassium: 4.3 mmol/L (ref 3.5–5.1)
Sodium: 127 mmol/L — ABNORMAL LOW (ref 135–145)
Total Bilirubin: 0.6 mg/dL (ref 0.3–1.2)
Total Protein: 8.2 g/dL — ABNORMAL HIGH (ref 6.5–8.1)

## 2018-06-14 LAB — CBC WITH DIFFERENTIAL/PLATELET
Abs Immature Granulocytes: 0.12 10*3/uL — ABNORMAL HIGH (ref 0.00–0.07)
Basophils Absolute: 0 10*3/uL (ref 0.0–0.1)
Basophils Relative: 0 %
Eosinophils Absolute: 0 10*3/uL (ref 0.0–0.5)
Eosinophils Relative: 0 %
HCT: 32.9 % — ABNORMAL LOW (ref 36.0–46.0)
Hemoglobin: 10.2 g/dL — ABNORMAL LOW (ref 12.0–15.0)
Immature Granulocytes: 1 %
Lymphocytes Relative: 10 %
Lymphs Abs: 1.6 10*3/uL (ref 0.7–4.0)
MCH: 26.6 pg (ref 26.0–34.0)
MCHC: 31 g/dL (ref 30.0–36.0)
MCV: 85.7 fL (ref 80.0–100.0)
Monocytes Absolute: 0.5 10*3/uL (ref 0.1–1.0)
Monocytes Relative: 3 %
Neutro Abs: 13.7 10*3/uL — ABNORMAL HIGH (ref 1.7–7.7)
Neutrophils Relative %: 86 %
Platelets: 415 10*3/uL — ABNORMAL HIGH (ref 150–400)
RBC: 3.84 MIL/uL — ABNORMAL LOW (ref 3.87–5.11)
RDW: 15.6 % — ABNORMAL HIGH (ref 11.5–15.5)
WBC: 15.9 10*3/uL — AB (ref 4.0–10.5)
nRBC: 0 % (ref 0.0–0.2)

## 2018-06-14 LAB — LACTIC ACID, PLASMA
Lactic Acid, Venous: 2.1 mmol/L (ref 0.5–1.9)
Lactic Acid, Venous: 0.9 mmol/L (ref 0.5–1.9)

## 2018-06-14 LAB — I-STAT TROPONIN, ED: Troponin i, poc: 0 ng/mL (ref 0.00–0.08)

## 2018-06-14 LAB — I-STAT BETA HCG BLOOD, ED (MC, WL, AP ONLY)

## 2018-06-14 LAB — PROTIME-INR
INR: 0.99
Prothrombin Time: 13 seconds (ref 11.4–15.2)

## 2018-06-14 MED ORDER — VANCOMYCIN HCL IN DEXTROSE 1-5 GM/200ML-% IV SOLN
1000.0000 mg | Freq: Three times a day (TID) | INTRAVENOUS | Status: DC
  Filled 2018-06-14: qty 200

## 2018-06-14 MED ORDER — SODIUM CHLORIDE 0.9 % IV SOLN
INTRAVENOUS | Status: AC
  Administered 2018-06-15: 01:00:00 via INTRAVENOUS

## 2018-06-14 MED ORDER — ACETAMINOPHEN 325 MG PO TABS
650.0000 mg | ORAL_TABLET | Freq: Four times a day (QID) | ORAL | Status: DC | PRN
  Administered 2018-06-16 – 2018-07-12 (×18): 650 mg via ORAL
  Filled 2018-06-14 (×18): qty 2

## 2018-06-14 MED ORDER — FENTANYL CITRATE (PF) 100 MCG/2ML IJ SOLN
100.0000 ug | Freq: Once | INTRAMUSCULAR | Status: AC
  Administered 2018-06-14: 100 ug via INTRAVENOUS
  Filled 2018-06-14: qty 2

## 2018-06-14 MED ORDER — ENOXAPARIN SODIUM 40 MG/0.4ML ~~LOC~~ SOLN
40.0000 mg | Freq: Every day | SUBCUTANEOUS | Status: AC
  Administered 2018-06-16 – 2018-07-02 (×17): 40 mg via SUBCUTANEOUS
  Filled 2018-06-14 (×17): qty 0.4

## 2018-06-14 MED ORDER — VANCOMYCIN HCL 10 G IV SOLR
1500.0000 mg | Freq: Once | INTRAVENOUS | Status: AC
  Administered 2018-06-15: 1500 mg via INTRAVENOUS
  Filled 2018-06-14: qty 1500

## 2018-06-14 MED ORDER — PRENATAL MULTIVITAMIN CH
1.0000 | ORAL_TABLET | Freq: Every day | ORAL | Status: DC
  Administered 2018-06-15 – 2018-08-04 (×51): 1 via ORAL
  Filled 2018-06-14 (×88): qty 1

## 2018-06-14 MED ORDER — SODIUM CHLORIDE 0.9% FLUSH
3.0000 mL | Freq: Two times a day (BID) | INTRAVENOUS | Status: DC
  Administered 2018-06-15 – 2018-06-23 (×13): 3 mL via INTRAVENOUS
  Administered 2018-06-24: 09:00:00 via INTRAVENOUS
  Administered 2018-06-25 – 2018-07-16 (×21): 3 mL via INTRAVENOUS

## 2018-06-14 MED ORDER — ACETAMINOPHEN 650 MG RE SUPP: 650.0000 mg | Freq: Four times a day (QID) | RECTAL | Status: DC | PRN

## 2018-06-14 MED ORDER — FENTANYL CITRATE (PF) 100 MCG/2ML IJ SOLN: 100.0000 ug | Freq: Once | INTRAMUSCULAR | Status: DC

## 2018-06-14 MED ORDER — SODIUM CHLORIDE 0.9 % IV BOLUS
500.0000 mL | Freq: Once | INTRAVENOUS | Status: AC
  Administered 2018-06-14: 500 mL via INTRAVENOUS

## 2018-06-14 MED ORDER — SODIUM CHLORIDE 0.9 % IV SOLN: 1.0000 g | INTRAVENOUS | Status: DC

## 2018-06-14 MED ORDER — SODIUM CHLORIDE 0.9% FLUSH
3.0000 mL | Freq: Once | INTRAVENOUS | Status: AC
  Administered 2018-06-14: 3 mL via INTRAVENOUS

## 2018-06-14 MED ORDER — SODIUM CHLORIDE 0.9 % IV SOLN: 500.0000 mg | INTRAVENOUS | Status: DC

## 2018-06-14 MED ORDER — SODIUM CHLORIDE 0.9 % IV BOLUS: 1000.0000 mL | Freq: Once | INTRAVENOUS | Status: DC

## 2018-06-14 NOTE — ED Notes (Signed)
Pt returned from MRI °

## 2018-06-14 NOTE — H&P (Signed)
History and Physical    [name] GNF:[id number] DOB: [birth year] DOA: 06/14/2018  PCP: [facility]   Patient coming from: Home   Chief Complaint: Low back pain, cough, malaise   HPI: Kelly [name] is a 33 y.o. 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  Patient reports that about back pain began to worsen about a week ago and the symptoms are similar to when she was experiencing a epidural abscess in July.  She is also had a productive cough, exertional dyspnea, and general malaise.  She reports a period of abstinence from IV drug abuse before she relapsed in December and has continued to use since that time.  She knows that she is pregnant and is following with OB/GYN.  She would like to get back into a rehabilitation program for her drug dependence.  ED Course: Upon arrival to the ED, patient is found to be afebrile, saturating 90% on room air, tachypneic in the low 30s, tachycardic to 120, and with blood pressure 92/57.  EKG features sinus tachycardia with rate 108.  Chest x-ray is concerning for multifocal pneumonia.  MRI lumbar spine is stable from September 2019.  Obstetric ultrasound reveals single living intrauterine gestation with EGA [redacted] weeks and 1 day.  Chemistry panel is notable for sodium of 127, alkaline phosphatase 202, albumin 2.3, and AST 269.  CBC features a leukocytosis to 15,900, mild thrombocytosis, and stable normocytic anemia.  Lactic acid was slightly elevated.  Blood cultures were collected, 500 cc normal saline was administered, and the patient was treated with vancomycin, Rocephin, and azithromycin  in the ED.  OB/GYN was consulted by the ED physician.  Tachycardia and blood pressure improved with IV fluids, there is no acute respiratory distress, and the patient will be observed for further evaluation and management.  Review of Systems:  All other systems reviewed and apart from HPI, are negative.  Past Medical History:  Diagnosis Date  . 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 Abscess of breast    rt breast  . ADD (attention deficit disorder)   . Back pain   . Bipolar 1 disorder ([place])   . Hypertension   . Leukocytosis   . Polysubstance abuse ([place])   . Sepsis ([place]) 12/02/2017  . Suicidal overdose [facility])     Past Surgical History:  Procedure Laterality Date  . BACK SURGERY     scoliosis-in care everywhere 08/1999  . CESAREAN SECTION    . CHOLECYSTECTOMY  March 2015  . FOREIGN BODY REMOVAL Right 05/28/2016   Procedure: REMOVAL FOREIGN BODY RIGHT ARM;  Surgeon: Meredith Pel, MD;  Location: [place];  Service: Orthopedics;  Laterality: Right;  . IRRIGATION AND DEBRIDEMENT ABSCESS Right 07/13/2014   Procedure: IRRIGATION AND DEBRIDEMEN TRIGHT BREAST ABSCESS;  Surgeon: Donnie Mesa, MD;  Location: [place];  Service: General;  Laterality: Right;  . LUMBAR LAMINECTOMY/DECOMPRESSION MICRODISCECTOMY N/A 12/05/2017   Procedure: LUMBAR LAMINECTOMY FOR EPIDURAL ABSCESS;  Surgeon: Newman Pies, MD;  Location: [place];  Service: Neurosurgery;  Laterality: N/A;  . RADIOLOGY WITH ANESTHESIA N/A 12/05/2017   Procedure: MRI WITH ANESTHESIA OF THORCIC AND LUMBAR SPINE WITH AND WITHOUT;  Surgeon: Radiologist, Medication,  MD;  Location: [place];  Service: Radiology;  Laterality: N/A;     reports that she has been smoking cigarettes. She has a 13.00 pack-year smoking history. She has never used smokeless tobacco. She reports current alcohol use. She reports current drug use. Drugs: Cocaine, Heroin, and IV.  Allergies  Allergen Reactions  . Hydrocodone Itching  . Morphine And  Related Nausea And Vomiting    Family History  Problem Relation Age of Onset  . Diabetes Mother   . Diabetes Father   . Hypertension Father   . Heart attack Neg Hx   . Hyperlipidemia Neg Hx   . Sudden death Neg Hx      Prior to Admission medications   Medication Sig Start Date End Date Taking? Authorizing Provider  acetaminophen (TYLENOL) 500 MG tablet Take 2 tablets (1,000 mg total) by mouth every 8 (eight) hours. Patient not taking: Reported on 04/01/2018 01/30/18   Bonnielee Haff, MD  ferrous sulfate 325 (65 FE) MG tablet Take 1 tablet (325 mg total) by mouth daily with breakfast. Patient not taking: Reported on 04/01/2018 01/31/18   Bonnielee Haff, MD  magnesium oxide (MAG-OX) 400 (241.3 Mg) MG tablet Take 1 tablet (400 mg total) by mouth 2 (two) times daily. Patient not taking: Reported on 04/01/2018 01/30/18   Bonnielee Haff, MD  methadone (DOLOPHINE) 10 MG/5ML solution Take 30 mLs (60 mg total) by mouth daily. Patient not taking: Reported on 05/08/2018 01/30/18   Bonnielee Haff, MD  nicotine polacrilex (NICORETTE) 2 MG gum Take 1 each (2 mg total) by mouth as needed for smoking cessation. Patient not taking: Reported on 04/01/2018 01/30/18   Bonnielee Haff, MD  pantoprazole (PROTONIX) 40 MG tablet Take 1 tablet (40 mg total) by mouth daily. Patient not taking: Reported on 04/01/2018 01/30/18   Bonnielee Haff, MD  polyethylene glycol [facility] / Floria Raveling) packet Take 17 g by mouth 2 (two) times daily. Patient not taking: Reported on 04/01/2018 01/30/18   Bonnielee Haff, MD  promethazine (PHENERGAN) 25 MG tablet Take 1 tablet (25 mg total) by mouth every 6 (six) hours as needed for nausea or vomiting. Patient not taking: Reported on 06/14/2018 05/08/18   Virginia Rochester, NP  senna-docusate (SENOKOT-S) 8.6-50 MG tablet Take 1 tablet by mouth 2 (two) times daily. Patient not taking: Reported on 04/01/2018 01/30/18   Bonnielee Haff, MD    Physical Exam: Vitals:   06/14/18 2100  06/14/18 2115 06/14/18 2130 06/14/18 2145  BP: 106/66 (!) 98/55 111/63 104/64  Pulse: (!) 103 (!) 102 (!) 101 (!) 103  Resp: (!) 23 (!) 24 (!) 26 (!) 22  Temp:      TempSrc:      SpO2: 93% 96% 95% 90%  Weight:      Height:        Constitutional: No acute respiratory distress, mild tachypnea, no pallor, no diaphoresis Eyes: PERTLA, lids and conjunctivae normal ENMT: Mucous membranes are moist. Posterior pharynx clear of any exudate or lesions.   Neck: normal, supple, no masses, no thyromegaly Respiratory: Mild tachypnea, speaking full sentences. Scattered rhonchi. No accessory muscle use.  Cardiovascular: Rate ~110 and regular. Pitting edema to bilateral LE's. Abdomen: Soft, non-tender, gravid uterus. Bowel sounds active.  Musculoskeletal: no clubbing / cyanosis. No joint deformity upper and lower extremities.    Skin: no significant rashes, lesions, ulcers. Warm, dry, well-perfused. Neurologic: CN 2-12 grossly intact. Sensation to light touch diminished in bilateral LE. Strength 5/5 in all 4 limbs.  Psychiatric: Alert and  oriented x 3. Calm, cooperative.    Labs on Admission: I have personally reviewed following labs and imaging studies  CBC: Recent Labs  Lab 06/14/18 1429  WBC 15.9*  NEUTROABS 13.7*  HGB 10.2*  HCT 32.9*  MCV 85.7  PLT 440*   Basic Metabolic Panel: Recent Labs  Lab 06/14/18 1429  NA 127*  K 4.3  CL 91*  CO2 21*  GLUCOSE 128*  BUN 6  CREATININE 0.62  CALCIUM 8.9   GFR: Estimated Creatinine Clearance: 107.1 mL/min (by C-G formula based on SCr of 0.62 mg/dL). Liver Function Tests: Recent Labs  Lab 06/14/18 1429  AST 269*  ALT 21  ALKPHOS 202*  BILITOT 0.6  PROT 8.2*  ALBUMIN 2.3*   No results for input(s): LIPASE, AMYLASE in the last 168 hours. No results for input(s): AMMONIA in the last 168 hours. Coagulation Profile: Recent Labs  Lab 06/14/18 1429  INR 0.99   Cardiac Enzymes: No results for input(s): CKTOTAL, CKMB, CKMBINDEX,  TROPONINI in the last 168 hours. BNP (last 3 results) No results for input(s): PROBNP in the last 8760 hours. HbA1C: No results for input(s): HGBA1C in the last 72 hours. CBG: No results for input(s): GLUCAP in the last 168 hours. Lipid Profile: No results for input(s): CHOL, HDL, LDLCALC, TRIG, CHOLHDL, LDLDIRECT in the last 72 hours. Thyroid Function Tests: No results for input(s): TSH, T4TOTAL, FREET4, T3FREE, THYROIDAB in the last 72 hours. Anemia Panel: No results for input(s): VITAMINB12, FOLATE, FERRITIN, TIBC, IRON, RETICCTPCT in the last 72 hours. Urine analysis:    Component Value Date/Time   COLORURINE AMBER (A) 06/14/2018 2203   APPEARANCEUR HAZY (A) 06/14/2018 2203   APPEARANCEUR Hazy 07/10/2013 1121   LABSPEC 1.009 06/14/2018 2203   LABSPEC 1.020 07/10/2013 1121   PHURINE 7.0 06/14/2018 2203   GLUCOSEU 50 (A) 06/14/2018 2203   GLUCOSEU Negative 07/10/2013 1121   HGBUR MODERATE (A) 06/14/2018 2203   BILIRUBINUR NEGATIVE 06/14/2018 2203   BILIRUBINUR Negative 07/10/2013 1121   KETONESUR NEGATIVE 06/14/2018 2203   PROTEINUR NEGATIVE 06/14/2018 2203   UROBILINOGEN 1.0 02/15/2015 0530   NITRITE NEGATIVE 06/14/2018 2203   LEUKOCYTESUR MODERATE (A) 06/14/2018 2203   LEUKOCYTESUR Trace 07/10/2013 1121   Sepsis Labs: @LABRCNTIP (procalcitonin:4,lacticidven:4) )No results found for this or any previous visit (from the past 240 hour(s)).   Radiological Exams on Admission: Dg Chest 2 View  Result Date: 06/14/2018 CLINICAL DATA:  Sepsis. Pt complains of cough x 1 week; c/o back pain started 1 week ago, with no control of bowel and bladder-- pain in lower back, right side. Reports abscess in that area that she had surgery on in July. Hx of endocarditis from IV drug use-- last used last night per RN notes. Has 1 - 2+ pitting edema bilateral lower legs per RN notes. Smoker EXAM: CHEST - 2 VIEW COMPARISON:  04/01/2018 FINDINGS: There are linear/interstitial opacities in both mid  and lower lungs with small areas of hazy airspace opacity most evident in the left mid lung, new since the prior exam. Upper lungs are clear. No pleural effusion or pneumothorax. Cardiac silhouette is normal in size and configuration. No mediastinal or hilar masses. No convincing adenopathy. Spine stabilization rods are stable. IMPRESSION: 1. New mid and lower lung zone opacities, linear and reticular as well as hazy airspace opacities, with airspace opacities most evident in the left mid lung. These findings are new prior exam. Suspect multifocal pneumonia. 2. No evidence of pulmonary edema.  No other change. Electronically Signed  By: Lajean Manes M.D.   On: 06/14/2018 15:24   Mr Lumbar Spine Wo Contrast  Result Date: 06/14/2018 CLINICAL DATA:  33 year old female with history of IV drug abuse, spinal MSSA infection in July 2019. Back pain and rapidly progressive neuro deficit. Chronic posterior spinal rods and lumbar pedicle screws. EXAM: MRI LUMBAR SPINE WITHOUT CONTRAST TECHNIQUE: Multiplanar, multisequence MR imaging of the lumbar spine was performed. No intravenous contrast was administered. COMPARISON:  01/28/2018 and earlier. FINDINGS: Segmentation: Normal thoracic and lumbar spine segmentation demonstrated on 12/02/2017 CTs. Alignment: Stable scoliosis with straightening of lumbar lordosis and visible thoracic kyphosis. Vertebrae: Extensive hardware susceptibility artifact again noted from the lower thoracic spine to L3-L4 limiting bone detail at those levels. No L4, L5, or visible sacral marrow edema or acute osseous abnormality. Conus medullaris and cauda equina: Lower thoracic spinal cord and conus poorly visible as before. Cauda equina nerve roots are visible from L4 to the sacrum and appear normal today. Paraspinal and other soft tissues: Distended urinary bladder, similar to the 12/02/2017 CT (series 5, image 9). Otherwise negative visible abdominal viscera. Postoperative changes to the lower  posterior lumbar paraspinal soft tissues with no postoperative fluid collection identified. Disc levels: Grossly stable given limited visualization from spinal hardware artifact from the lower thoracic levels through L2-L3. L3-L4: Chronic facet hypertrophy. No definite disc abnormality. Chronic mild to moderate left L3 foraminal stenosis appears stable. No spinal stenosis. L4-L5: Mild disc desiccation and circumferential disc bulge. Posterior decompression with mild to moderate residual facet hypertrophy. The thecal sac and epidural space at this level today appear normal. Mild to moderate left L4 foraminal stenosis is stable. L5-S1: Partial discectomy and posterior decompression redemonstrated with moderate residual facet hypertrophy. Epidural space is stable. No spinal stenosis. Moderate left L5 foraminal stenosis is stable. IMPRESSION: 1. No strong evidence of acute infection in the lumbar spine or visible sacrum. Stable MRI appearance of the lumbar spine since September 2019. 2. Distended urinary bladder. 3. Extensive chronic spinal hardware artifact from the lower thoracic levels through L3, and postoperative changes at L4-L5 and L5-S1. 4. 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 Electronically Signed   By: Genevie Ann M.D.   On: 06/14/2018 19:52   US Ob Limited > 14 Wks  Result Date: 06/14/2018 CLINICAL DATA:  33 year old pregnant female with back and pelvic pain for 1 week. IV drug use. EXAM: LIMITED OBSTETRIC ULTRASOUND FINDINGS: Number of Fetuses: 1 Heart Rate:  149 bpm Movement: Yes Presentation: Breech Placental Location: Anterior Previa: No Amniotic Fluid (Subjective):  Within normal limits. BPD: 4.7 cm 20 w  1 d MATERNAL FINDINGS: Cervix:  Appears closed. Uterus/Adnexae: Ovaries not identified. No adnexal masses or free fluid. IMPRESSION: Single living intrauterine gestation with estimated gestational age of [redacted] weeks 1 day by this ultrasound. No subchorionic  hemorrhage, placenta previa or free fluid. This exam is performed on an emergent basis and does not comprehensively evaluate fetal size, dating, or anatomy; follow-up complete OB US should be considered if further fetal assessment is warranted. Electronically Signed   By: Margarette Canada M.D.   On: 06/14/2018 16:17    EKG: Independently reviewed. Sinus tachycardia (rate 108).   Assessment/Plan  1. Sepsis secondary to multifocal pneumonia  - Presents with cough, malaise, increased low back pain with increased b/l leg numbness  - Found to tachycardic with SBP in low 90's, leukocytosis, slightly elevated lactate, and CXR concerning for multifocal PNA  - Blood cultures collected in ED and  she was started on vancomycin, Rocephin, and azithromycin  - Check sputum cultures, influenza PCR, and strep pneumo antigen  - Continue current antibiotics, follow cultures and clinical course    2. 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 Hyponatremia  - Serum sodium is 127 in ED, down from 132 in November  - There is peripheral edema in setting of severe tricuspid regurgitation, but also low BP and tachycardia suggestive of hypovolemia  - She received 1.5 liters NS in ED  - Check urine sodium, continue gentle IVF hydration overnight, repeat chem panel in a few hours    4. Elevated transaminases  - AST elevated to 238 in ED, was normal in November  - Abd exam benign, denies any EtOH use  - At risk for congestive hepatopathy with severe tricuspid regurg but bili is wnl    - Check viral hepatitis panel, repeat CMP in am   5. Pregnancy  - [place] in ED with EGA [redacted] wks and 1 day  - OBGYN consulted by EDP and much appreciated  - Continue monitoring, prenatal vit, supportive care     DVT prophylaxis: Lovenox  Code Status: Full  Family Communication: Discussed with patient  Consults called: OBGYN  Admission status: Observation     Vianne Bulls, MD [facility] Pager [phone]  If 7PM-7AM, please contact night-coverage [email or web address] Password [email or web address]  06/14/2018, 11:21 PM

## 2018-06-14 NOTE — ED Provider Notes (Addendum)
[facility] EMERGENCY DEPARTMENT Provider Note   CSN: [id number] Arrival date & time: 06/14/18  1404     History   Chief Complaint Chief Complaint  Patient presents with  . Back Pain    HPI Kelly [name] is a 33 y.o. female.  Patient with history of IVDU, complicated by lumbar spinal abscess, tricuspid valve endocarditis --presents to the emergency department with complaint of worsening back pain over the past week.  Patient states that she has numbness in her legs bilaterally that has expanded up to her mid calves.  She reports urinary incontinence and some fecal incontinence.  States that her symptoms are similar to when she had previous epidural abscess.  Patient also reports that she is pregnant.  Last menstrual period was in 12/2017.  Patient admits to relapsing and using IV drugs for approximately the past 6 weeks.  She reports fevers, chills, and night sweats at night.  No reported vomiting or diarrhea.  She has cough.  No hemoptysis.  No skin rashes or swelling related to IV injection site.  The onset of this condition was acute. The course is constant. Aggravating factors: none. Alleviating factors: none.       Past Medical History:  Diagnosis Date  . 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 Abscess of breast    rt breast  . ADD (attention deficit disorder)   . Back pain   . Bipolar 1 disorder ([place])   . Hypertension   . Leukocytosis   . Polysubstance abuse ([place])   . Sepsis ([place]) 12/02/2017  . Suicidal overdose [facility])     Patient Active Problem List   Diagnosis Date Noted  . 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 Difficult intravenous access   . Intravenous drug abuse ([place])   . AKI (acute kidney injury) ([place])   . Bipolar disorder ([place]) 07/31/2016  . Major depressive disorder, recurrent episode with anxious distress ([place]) 07/29/2016  . Normal labor 07/26/2016  . Foreign body granuloma  of soft tissue, NEC, right upper arm   . Pyelonephritis 05/28/2016  . Polysubstance abuse ([place]) 05/25/2016  . No prenatal care in current pregnancy 05/25/2016  . Chronic hypertension affecting pregnancy 05/25/2016  . History of cesarean delivery affecting pregnancy 05/25/2016  . Community acquired pneumonia 05/25/2016  . Pyelonephritis affecting pregnancy 05/24/2016  . Palliative care encounter   . Abscess of right breast 07/13/2014  . Opioid use disorder, severe, dependence ([place]) 08/25/2013  . Unspecified episodic mood disorder 08/25/2013  . ADHD (attention deficit hyperactivity disorder) 08/25/2013  . Cocaine abuse ([place]) 08/25/2013  . Thoracic back pain 01/29/2013  . CROHN'S DISEASE 05/03/2005  . COLONIC POLYPS 02/28/2003    Past Surgical History:  Procedure Laterality Date  . BACK SURGERY     scoliosis-in care everywhere 08/1999  . CESAREAN SECTION    . CHOLECYSTECTOMY  March 2015  . FOREIGN BODY REMOVAL Right 05/28/2016   Procedure: REMOVAL FOREIGN BODY RIGHT ARM;  Surgeon: Meredith Pel, MD;  Location: [facility];  Service: Orthopedics;  Laterality: Right;  . IRRIGATION AND DEBRIDEMENT ABSCESS Right 07/13/2014   Procedure: IRRIGATION AND DEBRIDEMEN TRIGHT BREAST ABSCESS;  Surgeon: Donnie Mesa, MD;  Location: [facility];  Service: General;  Laterality: Right;  . LUMBAR LAMINECTOMY/DECOMPRESSION MICRODISCECTOMY N/A 12/05/2017   Procedure: LUMBAR LAMINECTOMY FOR EPIDURAL ABSCESS;  Surgeon: Newman Pies, MD;  Location: [facility];  Service: Neurosurgery;  Laterality: N/A;  . RADIOLOGY WITH ANESTHESIA N/A  12/05/2017   Procedure: MRI WITH ANESTHESIA OF [facility] AND LUMBAR SPINE WITH AND WITHOUT;  Surgeon: Radiologist, Medication, MD;  Location: [facility];  Service: Radiology;  Laterality: N/A;     OB History    Gravida  6   Para  2   Term  1   Preterm      AB  3   Living  2     SAB  3   TAB      Ectopic      Multiple      Live Births  2            Home Medications      Prior to Admission medications   Medication Sig Start Date End Date Taking? Authorizing Provider  acetaminophen (TYLENOL) 500 MG tablet Take 2 tablets (1,000 mg total) by mouth every 8 (eight) hours. Patient not taking: Reported on 04/01/2018 01/30/18   Bonnielee Haff, MD  ferrous sulfate 325 (65 FE) MG tablet Take 1 tablet (325 mg total) by mouth daily with breakfast. Patient not taking: Reported on 04/01/2018 01/31/18   Bonnielee Haff, MD  magnesium oxide (MAG-OX) 400 (241.3 Mg) MG tablet Take 1 tablet (400 mg total) by mouth 2 (two) times daily. Patient not taking: Reported on 04/01/2018 01/30/18   Bonnielee Haff, MD  methadone (DOLOPHINE) 10 MG/5ML solution Take 30 mLs (60 mg total) by mouth daily. Patient not taking: Reported on 05/08/2018 01/30/18   Bonnielee Haff, MD  nicotine polacrilex (NICORETTE) 2 MG gum Take 1 each (2 mg total) by mouth as needed for smoking cessation. Patient not taking: Reported on 04/01/2018 01/30/18   Bonnielee Haff, MD  pantoprazole (PROTONIX) 40 MG tablet Take 1 tablet (40 mg total) by mouth daily. Patient not taking: Reported on 04/01/2018 01/30/18   Bonnielee Haff, MD  polyethylene glycol [facility] / Floria Raveling) packet Take 17 g by mouth 2 (two) times daily. Patient not taking: Reported on 04/01/2018 01/30/18   Bonnielee Haff, MD  promethazine (PHENERGAN) 25 MG tablet Take 1 tablet (25 mg total) by mouth every 6 (six) hours as needed for nausea or vomiting. 05/08/18   Burleson, Rona Ravens, NP  senna-docusate (SENOKOT-S) 8.6-50 MG tablet Take 1 tablet by mouth 2 (two) times daily. Patient not taking: Reported on 04/01/2018 01/30/18   Bonnielee Haff, MD    Family History Family History  Problem Relation Age of Onset  . Diabetes Mother   . Diabetes Father   . Hypertension Father   . Heart attack Neg Hx   . Hyperlipidemia Neg Hx   . Sudden death Neg Hx     Social History Social History   Tobacco Use  . Smoking status: Current Some Day Smoker     Packs/day: 1.00    Years: 13.00    Pack years: 13.00    Types: Cigarettes  . Smokeless tobacco: Never Used  Substance Use Topics  . Alcohol use: Yes    Comment: one or two drinks weekly  . Drug use: Yes    Types: Cocaine, Heroin, IV    Comment: heroin, crack cocaine last used 06/13/2018     Allergies   Hydrocodone and Morphine and related   Review of Systems Review of Systems  Constitutional: Positive for chills, diaphoresis and fever. Negative for unexpected weight change.  HENT: Negative for rhinorrhea and sore throat.   Eyes: Negative for redness.  Respiratory: Positive for cough.   Cardiovascular: Negative for chest pain and leg swelling.  Gastrointestinal: Negative for abdominal  pain, constipation, diarrhea, nausea and vomiting.       Negative for fecal incontinence.   Genitourinary: Positive for difficulty urinating and enuresis. Negative for dysuria, flank pain, hematuria, pelvic pain, vaginal bleeding and vaginal discharge.  Musculoskeletal: Positive for back pain. Negative for myalgias.  Skin: Negative for rash.  Neurological: Positive for weakness and numbness. Negative for headaches.       Denies saddle paresthesias.     Physical Exam Updated Vital Signs BP 117/76 (BP Location: Right Arm)   Pulse (!) 110   Temp 98.4 F (36.9 C) (Oral)   Resp (!) 31   Ht 5' 7"  (1.702 m)   Wt 77.1 kg   LMP 12/30/2017 (Within Weeks)   SpO2 96%   BMI 26.63 kg/m   Physical Exam Vitals signs and nursing note reviewed.  Constitutional:      Appearance: She is well-developed.  HENT:     Head: Normocephalic and atraumatic.  Eyes:     General:        Right eye: No discharge.        Left eye: No discharge.     Conjunctiva/sclera: Conjunctivae normal.  Neck:     Musculoskeletal: Normal range of motion and neck supple.  Cardiovascular:     Rate and Rhythm: Regular rhythm. Tachycardia present.     Heart sounds: Murmur (soft, L lower sternal border) present. Systolic murmur  present.  Pulmonary:     Effort: Pulmonary effort is normal.     Breath sounds: Normal breath sounds.  Abdominal:     Palpations: Abdomen is soft.     Tenderness: There is no abdominal tenderness.  Musculoskeletal:        General: No swelling.     Cervical back: She exhibits normal range of motion, no tenderness and no bony tenderness.     Thoracic back: She exhibits normal range of motion, no tenderness and no bony tenderness.     Lumbar back: She exhibits tenderness. She exhibits normal range of motion and no bony tenderness.     Comments: Intact strength lower extremities.  Skin:    General: Skin is warm and dry.     Comments: No Osler nodes, Janeway lesions.  Neurological:     General: No focal deficit present.     Mental Status: She is alert.     Comments: Patient reports decreased sensation in her lower extremities.      ED Treatments / Results  Labs (all labs ordered are listed, but only abnormal results are displayed) Labs Reviewed  COMPREHENSIVE METABOLIC PANEL - Abnormal; Notable for the following components:      Result Value   Sodium 127 (*)    Chloride 91 (*)    CO2 21 (*)    Glucose, Bld 128 (*)    Total Protein 8.2 (*)    Albumin 2.3 (*)    AST 269 (*)    Alkaline Phosphatase 202 (*)    All other components within normal limits  LACTIC ACID, PLASMA - Abnormal; Notable for the following components:   Lactic Acid, Venous 2.1 (*)    All other components within normal limits  CBC WITH DIFFERENTIAL/PLATELET - Abnormal; Notable for the following components:   WBC 15.9 (*)    RBC 3.84 (*)    Hemoglobin 10.2 (*)    HCT 32.9 (*)    RDW 15.6 (*)    Platelets 415 (*)    Neutro Abs 13.7 (*)    Abs Immature Granulocytes 0.12 (*)  All other components within normal limits  I-STAT BETA HCG BLOOD, ED ([facility], [facility], AP ONLY) - Abnormal; Notable for the following components:   I-stat hCG, quantitative >2,000.0 (*)    All other components within normal limits  CULTURE,  BLOOD (ROUTINE X 2)  CULTURE, BLOOD (ROUTINE X 2)  PROTIME-INR  LACTIC ACID, PLASMA  URINALYSIS, ROUTINE W REFLEX MICROSCOPIC    EKG EKG Interpretation  Date/Time:  Saturday June 14 2018 14:21:00 EST Ventricular Rate:  108 PR Interval:    QRS Duration: 87 QT Interval:  341 QTC Calculation: 457 R Axis:   84 Text Interpretation:  Sinus tachycardia Since last tracing rate faster Confirmed by Wandra Arthurs [phone] on 06/14/2018 3:53:14 PM   Radiology Dg Chest 2 View  Result Date: 06/14/2018 CLINICAL DATA:  Sepsis. Pt complains of cough x 1 week; c/o back pain started 1 week ago, with no control of bowel and bladder-- pain in lower back, right side. Reports abscess in that area that she had surgery on in July. Hx of endocarditis from IV drug use-- last used last night per RN notes. Has 1 - 2+ pitting edema bilateral lower legs per RN notes. Smoker EXAM: CHEST - 2 VIEW COMPARISON:  04/01/2018 FINDINGS: There are linear/interstitial opacities in both mid and lower lungs with small areas of hazy airspace opacity most evident in the left mid lung, new since the prior exam. Upper lungs are clear. No pleural effusion or pneumothorax. Cardiac silhouette is normal in size and configuration. No mediastinal or hilar masses. No convincing adenopathy. Spine stabilization rods are stable. IMPRESSION: 1. New mid and lower lung zone opacities, linear and reticular as well as hazy airspace opacities, with airspace opacities most evident in the left mid lung. These findings are new prior exam. Suspect multifocal pneumonia. 2. No evidence of pulmonary edema.  No other change. Electronically Signed   By: Lajean Manes M.D.   On: 06/14/2018 15:24   US Ob Limited > 14 Wks  Result Date: 06/14/2018 CLINICAL DATA:  33 year old pregnant female with back and pelvic pain for 1 week. IV drug use. EXAM: LIMITED OBSTETRIC ULTRASOUND FINDINGS: Number of Fetuses: 1 Heart Rate:  149 bpm Movement: Yes Presentation: Breech  Placental Location: Anterior Previa: No Amniotic Fluid (Subjective):  Within normal limits. BPD: 4.7 cm 20 w  1 d MATERNAL FINDINGS: Cervix:  Appears closed. Uterus/Adnexae: Ovaries not identified. No adnexal masses or free fluid. IMPRESSION: Single living intrauterine gestation with estimated gestational age of [redacted] weeks 1 day by this ultrasound. No subchorionic hemorrhage, placenta previa or free fluid. This exam is performed on an emergent basis and does not comprehensively evaluate fetal size, dating, or anatomy; follow-up complete OB US should be considered if further fetal assessment is warranted. Electronically Signed   By: Margarette Canada M.D.   On: 06/14/2018 16:17    Procedures Procedures (including critical care time)  Medications Ordered in ED Medications  sodium chloride flush (NS) 0.9 % injection 3 mL (has no administration in time range)     Initial Impression / Assessment and Plan / ED Course  I have reviewed the triage vital signs and the nursing notes.  Pertinent labs & imaging results that were available during my care of the patient were reviewed by me and considered in my medical decision making (see chart for details).  Clinical Course as of Jun 15 1614  Sat Jun 14, 2018  1550 Sodium(!): 127 [CG]  1550 Lactic Acid, Venous(!!): 2.1 [CG]  1550  WBC(!): 15.9 [CG]  1550 Hemoglobin(!): 10.2 [CG]  1550 IMPRESSION: 1. New mid and lower lung zone opacities, linear and reticular as well as hazy airspace opacities, with airspace opacities most evident in the left mid lung. These findings are new prior exam. Suspect multifocal pneumonia. 2. No evidence of pulmonary edema. No other change  DG Chest 2 View [CG]  1601 Pulse Rate(!): 110 [CG]  1602 Resp(!): 31 [CG]  1649 Single living intrauterine gestation with estimated gestational age of [redacted] weeks 1 day by this ultrasound.  US OB Limited > 14 wks [CG]  4373 Called MRI apparantly pt was taken but refused MRI bc of pain; RN has  pulled meds now. 2 patient ahead of pt now.    [CG]  2037 IMPRESSION: 1. No strong evidence of acute infection in the lumbar spine or visible sacrum. Stable MRI appearance of the lumbar spine since September 2019. 2. Distended urinary bladder. 3. Extensive chronic spinal hardware artifact from the lower thoracic levels through L3, and postoperative changes at L4-L5 and L5-S1. 4. 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  MR LUMBAR SPINE WO CONTRAST [CG]    Clinical Course User Index [CG] Kinnie Feil, PA-C   Patient seen and examined.  Work-up initiated. Will need [place], MRI, CXR.   Vital signs reviewed and are as follows: BP 117/76 (BP Location: Right Arm)   Pulse (!) 110   Temp 98.4 F (36.9 C) (Oral)   Resp (!) 31   Ht 5' 7"  (1.702 m)   Wt 77.1 kg   LMP 12/30/2017 (Within Weeks)   SpO2 96%   BMI 26.63 kg/m   Handoff to [employer] at shift change. CXK shows multifocal PNA.     Final Clinical Impressions(s) / ED Diagnoses   Final diagnoses:  None    ED Discharge Orders    None       Carlisle Cater, PA-C 06/14/18 1708    Carlisle Cater, PA-C 06/15/18 1616    Gareth Morgan, MD 06/16/18 [phone]

## 2018-06-14 NOTE — ED Triage Notes (Addendum)
C/o back pain started 1 week ago, with no control of bowel and bladder-- pain in lower back, right side. Hx of endocarditis from IV drug use-- last used last night. (started using in [place] being clean.  Has 1 - 2+ pitting edema bilateral lower legs  States that she is pregnant- last period was in august

## 2018-06-14 NOTE — ED Notes (Signed)
Patient transported to MRI 

## 2018-06-14 NOTE — ED Notes (Signed)
Patient transported to Ultrasound and MRI

## 2018-06-14 NOTE — ED Notes (Signed)
Patient transported to X-ray 

## 2018-06-14 NOTE — ED Notes (Signed)
Placed on 2L [place]  

## 2018-06-14 NOTE — ED Notes (Signed)
Delay in getting report called due to a trauma pt.

## 2018-06-14 NOTE — ED Provider Notes (Signed)
1548: Hand off from previous ED PA. Please see previous note for full details but briefly pt is a 33 yo F with continued opioid/IVD use, L2-5 epidural abscess s/p laminectomy and drainage on 11/2017 (Dr Arnoldo Morale), tricuspid valve MSSA endocarditis, pulmonary abscess, currently approx [redacted] week pregnant. She reports worsening back pain, b/b incontinence, numbness to bilateral lower legs, cough, chest pain, shortness of breath, subjective fevers and sweats at night for close to 2 weeks.  Previous team has held off on abx until MRI in case pt needs surgical intervention. [facility] has seen pt in ER recommends OB US.  Consider re-consult for IV abx dosing. Plan to tx pain, MRI, f/u on OB US, consult neuro and OB to assist with abx and admit.    Physical Exam  BP 104/64   Pulse (!) 103   Temp 98.4 F (36.9 C) (Oral)   Resp (!) 22   Ht 5' 7"  (1.702 m)   Wt 77.1 kg   LMP 12/30/2017 (Within Weeks)   SpO2 90%   BMI 26.63 kg/m   Physical Exam Vitals signs and nursing note reviewed.  Constitutional:      Appearance: She is well-developed. She is not toxic-appearing.  HENT:     Head: Normocephalic.     Right Ear: External ear normal.     Left Ear: External ear normal.     Nose: Nose normal.  Eyes:     Conjunctiva/sclera: Conjunctivae normal.  Neck:     Musculoskeletal: Full passive range of motion without pain.  Cardiovascular:     Rate and Rhythm: Normal rate.  Pulmonary:     Effort: Pulmonary effort is normal. No tachypnea or respiratory distress.  Musculoskeletal: Normal range of motion.  Skin:    General: Skin is warm and dry.     Capillary Refill: Capillary refill takes less than 2 seconds.  Neurological:     Mental Status: She is alert and oriented to person, place, and time.  Psychiatric:        Behavior: Behavior normal.        Thought Content: Thought content normal.     ED Course/Procedures   Clinical Course as of Jun 14 2214  Sat Jun 14, 2018  1550 Sodium(!): 127 [CG]  1550  Lactic Acid, Venous(!!): 2.1 [CG]  1550 WBC(!): 15.9 [CG]  1550 Hemoglobin(!): 10.2 [CG]  1550 IMPRESSION: 1. New mid and lower lung zone opacities, linear and reticular as well as hazy airspace opacities, with airspace opacities most evident in the left mid lung. These findings are new prior exam. Suspect multifocal pneumonia. 2. No evidence of pulmonary edema. No other change  DG Chest 2 View [CG]  1601 Pulse Rate(!): 110 [CG]  1602 Resp(!): 31 [CG]  1649 Single living intrauterine gestation with estimated gestational age of [redacted] weeks 1 day by this ultrasound.  US OB Limited > 14 wks [CG]  1607 Called MRI apparantly pt was taken but refused MRI bc of pain; RN has pulled meds now. 2 patient ahead of pt now.    [CG]  2037 IMPRESSION: 1. No strong evidence of acute infection in the lumbar spine or visible sacrum. Stable MRI appearance of the lumbar spine since September 2019. 2. Distended urinary bladder. 3. Extensive chronic spinal hardware artifact from the lower thoracic levels through L3, and postoperative changes at L4-L5 and L5-S1. 4. 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  MR LUMBAR SPINE WO CONTRAST [CG]    Clinical Course  User Index [CG] Kinnie Feil, PA-C    .Critical Care Performed by: Kinnie Feil, PA-C Authorized by: Kinnie Feil, PA-C   Critical care provider statement:    Critical care time (minutes):  45   Critical care was necessary to treat or prevent imminent or life-threatening deterioration of the following conditions:  Respiratory failure   Critical care was time spent personally by me on the following activities:  Discussions with consultants, evaluation of patient's response to treatment, examination of patient, ordering and performing treatments and interventions, ordering and review of laboratory studies, ordering and review of radiographic studies, pulse oximetry, re-evaluation of patient's condition,  obtaining history from patient or surrogate and review of old charts   I assumed direction of critical care for this patient from another provider in my specialty: no      MDM   2103: MRI unremarkable.  SPO2 is dropping to 90 to 92% on room air, patient is reporting shortness of breath so she was placed on 2 L nasal cannula.  We will add troponin.  Pending urinalysis.  I spoke to pharmacy who will assist with antibiotic choice and dosing.  Pending OB consult.  Patient may need echocardiogram some may need to be admitted here unless this can be managed at [facility].  2145: Spoke to Dr Elly [facility]) who agrees with abx choice.  Only OB interventions she recommends is daily fetal dopplers, upon discharge can fu with OB to establish routine prenatal care.  Pharmacy consulted who has placed orders for abx. UA, trop, blood cx pending.      Kinnie Feil, PA-C 06/14/18 2217    Drenda Freeze, MD 06/14/18 [phone]

## 2018-06-14 NOTE — Progress Notes (Signed)
Pharmacy Antibiotic Note  Kelly [name] is a 33 y.o. female admitted on 06/14/2018 with pneumonia.  Pharmacy has been consulted for vancomycin dosing.  Presenting with cough, SOB, subjective fevers, and night sweats. Has hx of IV drug use and tricuspid valve MSSA endocarditis. Found to be [redacted] weeks pregnant. WBC 15.9, LA 2.1, afebrile. Scr 0.62 (CrCl>100 mL/min).  Plan: Vancomycin 1500 mg IV once Vancomycin 1000 mg IV every 8 hours.  Goal trough 15-20 mcg/mL. Azithromycin 500 mg IV every 24 hours  Ceftriaxone 1 g IV every 24 hours  Monitor renal fx, clinical pic, cx results, and levels as indicated  Height: 5' 7"  (170.2 cm) Weight: 170 lb (77.1 kg) IBW/kg (Calculated) : 61.6  Temp (24hrs), Avg:98.4 F (36.9 C), Min:98.4 F (36.9 C), Max:98.4 F (36.9 C)  Recent Labs  Lab 06/14/18 1429  WBC 15.9*  CREATININE 0.62  LATICACIDVEN 2.1*    Estimated Creatinine Clearance: 107.1 mL/min (by C-G formula based on SCr of 0.62 mg/dL).    Allergies  Allergen Reactions  . Hydrocodone Itching  . Morphine And Related Nausea And Vomiting    Antimicrobials this admission: Vancomcyin 2/1 >>  Azithromycin 2/1 >>  Ceftriaxone 2/1>>  Dose adjustments this admission: N/A  Microbiology results: 2/1 BCx: sent 2/1 UCx: sent   Thank you for allowing pharmacy to be a part of this patient's care.  Antonietta Jewel, PharmD, [facility] Clinical Pharmacist  Pager: [phone] Phone: [phone] 06/14/2018 9:44 PM

## 2018-06-15 ENCOUNTER — Other Ambulatory Visit: Payer: Self-pay

## 2018-06-15 DIAGNOSIS — X58XXXA Exposure to other specified factors, initial encounter: Secondary | ICD-10-CM | POA: Diagnosis not present

## 2018-06-15 DIAGNOSIS — K0401 Reversible pulpitis: Secondary | ICD-10-CM | POA: Diagnosis present

## 2018-06-15 DIAGNOSIS — K029 Dental caries, unspecified: Secondary | ICD-10-CM | POA: Diagnosis not present

## 2018-06-15 DIAGNOSIS — O99320 Drug use complicating pregnancy, unspecified trimester: Secondary | ICD-10-CM | POA: Diagnosis not present

## 2018-06-15 DIAGNOSIS — I361 Nonrheumatic tricuspid (valve) insufficiency: Secondary | ICD-10-CM | POA: Diagnosis not present

## 2018-06-15 DIAGNOSIS — O99322 Drug use complicating pregnancy, second trimester: Secondary | ICD-10-CM | POA: Diagnosis present

## 2018-06-15 DIAGNOSIS — Z885 Allergy status to narcotic agent status: Secondary | ICD-10-CM | POA: Diagnosis not present

## 2018-06-15 DIAGNOSIS — Z8249 Family history of ischemic heart disease and other diseases of the circulatory system: Secondary | ICD-10-CM | POA: Diagnosis not present

## 2018-06-15 DIAGNOSIS — O99342 Other mental disorders complicating pregnancy, second trimester: Secondary | ICD-10-CM | POA: Diagnosis not present

## 2018-06-15 DIAGNOSIS — R740 Nonspecific elevation of levels of transaminase and lactic acid dehydrogenase [LDH]: Secondary | ICD-10-CM | POA: Diagnosis not present

## 2018-06-15 DIAGNOSIS — I2690 Septic pulmonary embolism without acute cor pulmonale: Secondary | ICD-10-CM | POA: Diagnosis present

## 2018-06-15 DIAGNOSIS — Z3A26 26 weeks gestation of pregnancy: Secondary | ICD-10-CM | POA: Diagnosis not present

## 2018-06-15 DIAGNOSIS — R7881 Bacteremia: Secondary | ICD-10-CM

## 2018-06-15 DIAGNOSIS — Z3A21 21 weeks gestation of pregnancy: Secondary | ICD-10-CM | POA: Diagnosis not present

## 2018-06-15 DIAGNOSIS — E871 Hypo-osmolality and hyponatremia: Secondary | ICD-10-CM | POA: Diagnosis present

## 2018-06-15 DIAGNOSIS — Z363 Encounter for antenatal screening for malformations: Secondary | ICD-10-CM | POA: Diagnosis not present

## 2018-06-15 DIAGNOSIS — F1990 Other psychoactive substance use, unspecified, uncomplicated: Secondary | ICD-10-CM | POA: Diagnosis not present

## 2018-06-15 DIAGNOSIS — O26892 Other specified pregnancy related conditions, second trimester: Secondary | ICD-10-CM | POA: Diagnosis not present

## 2018-06-15 DIAGNOSIS — Z3A25 25 weeks gestation of pregnancy: Secondary | ICD-10-CM | POA: Diagnosis not present

## 2018-06-15 DIAGNOSIS — O99512 Diseases of the respiratory system complicating pregnancy, second trimester: Secondary | ICD-10-CM | POA: Diagnosis present

## 2018-06-15 DIAGNOSIS — Z3A22 22 weeks gestation of pregnancy: Secondary | ICD-10-CM | POA: Diagnosis not present

## 2018-06-15 DIAGNOSIS — O26893 Other specified pregnancy related conditions, third trimester: Secondary | ICD-10-CM | POA: Diagnosis not present

## 2018-06-15 DIAGNOSIS — B950 Streptococcus, group A, as the cause of diseases classified elsewhere: Secondary | ICD-10-CM | POA: Diagnosis not present

## 2018-06-15 DIAGNOSIS — B952 Enterococcus as the cause of diseases classified elsewhere: Secondary | ICD-10-CM

## 2018-06-15 DIAGNOSIS — R140 Abdominal distension (gaseous): Secondary | ICD-10-CM | POA: Diagnosis not present

## 2018-06-15 DIAGNOSIS — K045 Chronic apical periodontitis: Secondary | ICD-10-CM | POA: Diagnosis not present

## 2018-06-15 DIAGNOSIS — F17210 Nicotine dependence, cigarettes, uncomplicated: Secondary | ICD-10-CM | POA: Diagnosis present

## 2018-06-15 DIAGNOSIS — B954 Other streptococcus as the cause of diseases classified elsewhere: Secondary | ICD-10-CM | POA: Diagnosis not present

## 2018-06-15 DIAGNOSIS — B9620 Unspecified Escherichia coli [E. coli] as the cause of diseases classified elsewhere: Secondary | ICD-10-CM | POA: Diagnosis present

## 2018-06-15 DIAGNOSIS — O2342 Unspecified infection of urinary tract in pregnancy, second trimester: Secondary | ICD-10-CM | POA: Diagnosis present

## 2018-06-15 DIAGNOSIS — O99412 Diseases of the circulatory system complicating pregnancy, second trimester: Secondary | ICD-10-CM | POA: Diagnosis present

## 2018-06-15 DIAGNOSIS — M4726 Other spondylosis with radiculopathy, lumbar region: Secondary | ICD-10-CM | POA: Diagnosis not present

## 2018-06-15 DIAGNOSIS — Z833 Family history of diabetes mellitus: Secondary | ICD-10-CM | POA: Diagnosis not present

## 2018-06-15 DIAGNOSIS — J189 Pneumonia, unspecified organism: Secondary | ICD-10-CM | POA: Diagnosis not present

## 2018-06-15 DIAGNOSIS — O26891 Other specified pregnancy related conditions, first trimester: Secondary | ICD-10-CM | POA: Diagnosis not present

## 2018-06-15 DIAGNOSIS — J101 Influenza due to other identified influenza virus with other respiratory manifestations: Secondary | ICD-10-CM | POA: Diagnosis not present

## 2018-06-15 DIAGNOSIS — O99332 Smoking (tobacco) complicating pregnancy, second trimester: Secondary | ICD-10-CM | POA: Diagnosis present

## 2018-06-15 DIAGNOSIS — Z8679 Personal history of other diseases of the circulatory system: Secondary | ICD-10-CM | POA: Diagnosis not present

## 2018-06-15 DIAGNOSIS — I330 Acute and subacute infective endocarditis: Secondary | ICD-10-CM | POA: Diagnosis present

## 2018-06-15 DIAGNOSIS — F1123 Opioid dependence with withdrawal: Secondary | ICD-10-CM | POA: Diagnosis not present

## 2018-06-15 DIAGNOSIS — Z3A20 20 weeks gestation of pregnancy: Secondary | ICD-10-CM | POA: Diagnosis not present

## 2018-06-15 DIAGNOSIS — I071 Rheumatic tricuspid insufficiency: Secondary | ICD-10-CM | POA: Diagnosis present

## 2018-06-15 DIAGNOSIS — L7632 Postprocedural hematoma of skin and subcutaneous tissue following other procedure: Secondary | ICD-10-CM | POA: Diagnosis not present

## 2018-06-15 DIAGNOSIS — O99282 Endocrine, nutritional and metabolic diseases complicating pregnancy, second trimester: Secondary | ICD-10-CM | POA: Diagnosis present

## 2018-06-15 DIAGNOSIS — Z9049 Acquired absence of other specified parts of digestive tract: Secondary | ICD-10-CM | POA: Diagnosis not present

## 2018-06-15 DIAGNOSIS — O10012 Pre-existing essential hypertension complicating pregnancy, second trimester: Secondary | ICD-10-CM | POA: Diagnosis not present

## 2018-06-15 DIAGNOSIS — A409 Streptococcal sepsis, unspecified: Secondary | ICD-10-CM | POA: Diagnosis present

## 2018-06-15 DIAGNOSIS — J1008 Influenza due to other identified influenza virus with other specified pneumonia: Secondary | ICD-10-CM | POA: Diagnosis present

## 2018-06-15 DIAGNOSIS — O98812 Other maternal infectious and parasitic diseases complicating pregnancy, second trimester: Secondary | ICD-10-CM | POA: Diagnosis present

## 2018-06-15 DIAGNOSIS — M545 Low back pain: Secondary | ICD-10-CM | POA: Diagnosis not present

## 2018-06-15 DIAGNOSIS — A491 Streptococcal infection, unspecified site: Secondary | ICD-10-CM | POA: Diagnosis not present

## 2018-06-15 DIAGNOSIS — I339 Acute and subacute endocarditis, unspecified: Secondary | ICD-10-CM | POA: Diagnosis not present

## 2018-06-15 DIAGNOSIS — Z1611 Resistance to penicillins: Secondary | ICD-10-CM | POA: Diagnosis not present

## 2018-06-15 DIAGNOSIS — Y92239 Unspecified place in hospital as the place of occurrence of the external cause: Secondary | ICD-10-CM | POA: Diagnosis not present

## 2018-06-15 DIAGNOSIS — O10912 Unspecified pre-existing hypertension complicating pregnancy, second trimester: Secondary | ICD-10-CM | POA: Diagnosis present

## 2018-06-15 HISTORY — DX: Enterococcus as the cause of diseases classified elsewhere: B95.2

## 2018-06-15 LAB — RAPID URINE DRUG SCREEN, HOSP PERFORMED
Amphetamines: NOT DETECTED
Barbiturates: NOT DETECTED
Benzodiazepines: NOT DETECTED
Cocaine: NOT DETECTED
Opiates: POSITIVE — AB
Tetrahydrocannabinol: NOT DETECTED

## 2018-06-15 LAB — CBC WITH DIFFERENTIAL/PLATELET
Abs Immature Granulocytes: 0.11 10*3/uL — ABNORMAL HIGH (ref 0.00–0.07)
Basophils Absolute: 0 10*3/uL (ref 0.0–0.1)
Basophils Relative: 0 %
EOS PCT: 0 %
Eosinophils Absolute: 0 10*3/uL (ref 0.0–0.5)
HCT: 25.3 % — ABNORMAL LOW (ref 36.0–46.0)
Hemoglobin: 8 g/dL — ABNORMAL LOW (ref 12.0–15.0)
Immature Granulocytes: 1 %
Lymphocytes Relative: 11 %
Lymphs Abs: 1.4 10*3/uL (ref 0.7–4.0)
MCH: 26.7 pg (ref 26.0–34.0)
MCHC: 31.6 g/dL (ref 30.0–36.0)
MCV: 84.3 fL (ref 80.0–100.0)
MONOS PCT: 4 %
Monocytes Absolute: 0.4 10*3/uL (ref 0.1–1.0)
Neutro Abs: 10.6 10*3/uL — ABNORMAL HIGH (ref 1.7–7.7)
Neutrophils Relative %: 84 %
Platelets: 367 10*3/uL (ref 150–400)
RBC: 3 MIL/uL — ABNORMAL LOW (ref 3.87–5.11)
RDW: 15.3 % (ref 11.5–15.5)
WBC: 12.6 10*3/uL — ABNORMAL HIGH (ref 4.0–10.5)
nRBC: 0 % (ref 0.0–0.2)

## 2018-06-15 LAB — COMPREHENSIVE METABOLIC PANEL
ALT: 19 U/L (ref 0–44)
AST: 231 U/L — ABNORMAL HIGH (ref 15–41)
Albumin: 1.7 g/dL — ABNORMAL LOW (ref 3.5–5.0)
Alkaline Phosphatase: 161 U/L — ABNORMAL HIGH (ref 38–126)
Anion gap: 12 (ref 5–15)
BUN: 5 mg/dL — ABNORMAL LOW (ref 6–20)
CO2: 22 mmol/L (ref 22–32)
Calcium: 8.2 mg/dL — ABNORMAL LOW (ref 8.9–10.3)
Chloride: 97 mmol/L — ABNORMAL LOW (ref 98–111)
Creatinine, Ser: 0.6 mg/dL (ref 0.44–1.00)
GFR calc Af Amer: >60 mL/min (ref >60)
GFR calc non Af Amer: >60 mL/min (ref >60)
Glucose, Bld: 123 mg/dL — ABNORMAL HIGH (ref 70–99)
Potassium: 3.5 mmol/L (ref 3.5–5.1)
SODIUM: 131 mmol/L — AB (ref 135–145)
Total Bilirubin: 0.6 mg/dL (ref 0.3–1.2)
Total Protein: 6.6 g/dL (ref 6.5–8.1)

## 2018-06-15 LAB — HEPATITIS PANEL, ACUTE
HCV Ab: >11 s/co ratio — ABNORMAL HIGH (ref 0.0–0.9)
Hep A IgM: NEGATIVE
Hep B C IgM: NEGATIVE
Hepatitis B Surface Ag: NEGATIVE

## 2018-06-15 LAB — BLOOD CULTURE ID PANEL (REFLEXED)
Acinetobacter baumannii: NOT DETECTED
CANDIDA KRUSEI: NOT DETECTED
Candida albicans: NOT DETECTED
Candida glabrata: NOT DETECTED
Candida parapsilosis: NOT DETECTED
Candida tropicalis: NOT DETECTED
ESCHERICHIA COLI: NOT DETECTED
Enterobacter cloacae complex: NOT DETECTED
Enterobacteriaceae species: NOT DETECTED
Enterococcus species: NOT DETECTED
Haemophilus influenzae: NOT DETECTED
Klebsiella oxytoca: NOT DETECTED
Klebsiella pneumoniae: NOT DETECTED
Listeria monocytogenes: NOT DETECTED
Neisseria meningitidis: NOT DETECTED
Proteus species: NOT DETECTED
Pseudomonas aeruginosa: NOT DETECTED
Serratia marcescens: NOT DETECTED
Staphylococcus aureus (BCID): NOT DETECTED
Staphylococcus species: NOT DETECTED
Streptococcus agalactiae: NOT DETECTED
Streptococcus pneumoniae: NOT DETECTED
Streptococcus pyogenes: NOT DETECTED
Streptococcus species: DETECTED — AB

## 2018-06-15 LAB — STREP PNEUMONIAE URINARY ANTIGEN: Strep Pneumo Urinary Antigen: NEGATIVE

## 2018-06-15 LAB — LACTIC ACID, PLASMA: Lactic Acid, Venous: 1.5 mmol/L (ref 0.5–1.9)

## 2018-06-15 LAB — HIV ANTIBODY (ROUTINE TESTING W REFLEX): HIV SCREEN 4TH GENERATION: NONREACTIVE

## 2018-06-15 MED ORDER — METHADONE HCL 10 MG PO TABS
20.0000 mg | ORAL_TABLET | Freq: Three times a day (TID) | ORAL | Status: DC
  Administered 2018-06-15 – 2018-06-16 (×3): 20 mg via ORAL
  Filled 2018-06-15 (×3): qty 2

## 2018-06-15 MED ORDER — DIPHENHYDRAMINE HCL 50 MG/ML IJ SOLN
25.0000 mg | Freq: Four times a day (QID) | INTRAMUSCULAR | Status: DC | PRN
  Administered 2018-06-15 – 2018-07-14 (×81): 25 mg via INTRAVENOUS
  Filled 2018-06-15 (×82): qty 1

## 2018-06-15 MED ORDER — BUPRENORPHINE HCL-NALOXONE HCL 8-2 MG SL SUBL: 1.0000 | SUBLINGUAL_TABLET | Freq: Two times a day (BID) | SUBLINGUAL | Status: DC

## 2018-06-15 MED ORDER — METHADONE HCL 10 MG/ML PO CONC: 40.0000 mg | Freq: Every day | ORAL | Status: DC

## 2018-06-15 MED ORDER — METHADONE HCL 10 MG PO TABS
10.0000 mg | ORAL_TABLET | Freq: Once | ORAL | Status: AC
  Administered 2018-06-15: 10 mg via ORAL
  Filled 2018-06-15: qty 1

## 2018-06-15 MED ORDER — SODIUM CHLORIDE 0.9 % IV SOLN
INTRAVENOUS | Status: DC
  Administered 2018-06-15 – 2018-06-16 (×3): via INTRAVENOUS

## 2018-06-15 MED ORDER — CLONIDINE HCL 0.1 MG PO TABS
0.1000 mg | ORAL_TABLET | Freq: Four times a day (QID) | ORAL | Status: DC
  Administered 2018-06-15 (×2): 0.1 mg via ORAL
  Filled 2018-06-15 (×2): qty 1

## 2018-06-15 MED ORDER — CLONIDINE HCL 0.1 MG PO TABS: 0.1000 mg | ORAL_TABLET | Freq: Every day | ORAL | Status: DC

## 2018-06-15 MED ORDER — ONDANSETRON HCL 4 MG/2ML IJ SOLN
4.0000 mg | Freq: Four times a day (QID) | INTRAMUSCULAR | Status: DC | PRN
  Administered 2018-06-15 – 2018-07-29 (×66): 4 mg via INTRAVENOUS
  Filled 2018-06-15 (×72): qty 2

## 2018-06-15 MED ORDER — METHADONE HCL 10 MG PO TABS
10.0000 mg | ORAL_TABLET | Freq: Three times a day (TID) | ORAL | Status: DC
  Administered 2018-06-15 (×2): 10 mg via ORAL
  Filled 2018-06-15 (×2): qty 1

## 2018-06-15 MED ORDER — BUPRENORPHINE HCL 2 MG SL SUBL: 2.0000 mg | SUBLINGUAL_TABLET | Freq: Four times a day (QID) | SUBLINGUAL | Status: DC | PRN

## 2018-06-15 MED ORDER — BENZONATATE 100 MG PO CAPS
200.0000 mg | ORAL_CAPSULE | Freq: Three times a day (TID) | ORAL | Status: DC | PRN
  Administered 2018-06-15 – 2018-07-04 (×14): 200 mg via ORAL
  Filled 2018-06-15 (×15): qty 2

## 2018-06-15 MED ORDER — PROMETHAZINE HCL 25 MG/ML IJ SOLN
12.5000 mg | Freq: Four times a day (QID) | INTRAMUSCULAR | Status: DC | PRN
  Administered 2018-06-15 (×2): 25 mg via INTRAVENOUS
  Administered 2018-06-16 – 2018-06-17 (×3): 12.5 mg via INTRAVENOUS
  Administered 2018-06-17 – 2018-07-03 (×50): 25 mg via INTRAVENOUS
  Administered 2018-07-03 – 2018-07-04 (×3): 12.5 mg via INTRAVENOUS
  Administered 2018-07-04 – 2018-07-05 (×4): 25 mg via INTRAVENOUS
  Administered 2018-07-05: 12.5 mg via INTRAVENOUS
  Administered 2018-07-06 – 2018-07-22 (×63): 25 mg via INTRAVENOUS
  Administered 2018-07-23: 12.5 mg via INTRAVENOUS
  Administered 2018-07-23 – 2018-08-04 (×42): 25 mg via INTRAVENOUS
  Filled 2018-06-15 (×173): qty 1

## 2018-06-15 MED ORDER — BUPRENORPHINE HCL 8 MG SL SUBL: 8.0000 mg | SUBLINGUAL_TABLET | Freq: Two times a day (BID) | SUBLINGUAL | Status: DC

## 2018-06-15 MED ORDER — METHADONE HCL 10 MG/ML PO CONC: 60.0000 mg | Freq: Every day | ORAL | Status: DC

## 2018-06-15 MED ORDER — BUPRENORPHINE HCL-NALOXONE HCL 2-0.5 MG SL SUBL: 1.0000 | SUBLINGUAL_TABLET | Freq: Four times a day (QID) | SUBLINGUAL | Status: DC | PRN

## 2018-06-15 MED ORDER — CLONIDINE HCL 0.1 MG PO TABS: 0.1000 mg | ORAL_TABLET | ORAL | Status: DC

## 2018-06-15 MED ORDER — SODIUM CHLORIDE 0.9 % IV SOLN
2.0000 g | INTRAVENOUS | Status: DC
  Administered 2018-06-15 – 2018-06-17 (×3): 2 g via INTRAVENOUS
  Filled 2018-06-15 (×3): qty 20

## 2018-06-15 NOTE — Plan of Care (Signed)
  Problem: Education: Goal: Knowledge of General Education information will improve Description Including pain rating scale, medication(s)/side effects and non-pharmacologic comfort measures Outcome: Not Progressing   Problem: Health Behavior/Discharge Planning: Goal: Ability to manage health-related needs will improve Outcome: Not Progressing   Problem: Clinical Measurements: Goal: Respiratory complications will improve Outcome: Not Progressing   Problem: Activity: Goal: Risk for activity intolerance will decrease Outcome: Not Progressing   Problem: Nutrition: Goal: Adequate nutrition will be maintained Outcome: Not Progressing   Problem: Coping: Goal: Level of anxiety will decrease Outcome: Not Progressing

## 2018-06-15 NOTE — Progress Notes (Signed)
Patient is beginning to throw up.  Believes she is getting "dope sick".  Order for methadone needs to be approved by pharmacy.  Pharmacy needs a new order from the on-call NP for a lower dosage.

## 2018-06-15 NOTE — Progress Notes (Addendum)
PHARMACY - PHYSICIAN COMMUNICATION CRITICAL VALUE ALERT - BLOOD CULTURE IDENTIFICATION (BCID)  Kelly [name] is an 33 y.o. female who presented to [facility] on 06/14/2018 with a chief complaint of sepsis (?PNA, ?endocarditis)  Assessment:  Pt has hx of MSSA endocarditis, hx of epidural abscess  Name of physician (or Provider) Contacted:  X Blount ([facility])  Current antibiotics: Vancomycin/Ceftriaxone/Azithromycin   Changes to prescribed antibiotics recommended:  -No changes for now given concern for multi-focal PNA as well  Results for orders placed or performed during the hospital encounter of 06/14/18  Blood Culture ID Panel (Reflexed) (Collected: 06/14/2018  2:35 PM)  Result Value Ref Range   Enterococcus species NOT DETECTED NOT DETECTED   Listeria monocytogenes NOT DETECTED NOT DETECTED   Staphylococcus species NOT DETECTED NOT DETECTED   Staphylococcus aureus (BCID) NOT DETECTED NOT DETECTED   Streptococcus species DETECTED (A) NOT DETECTED   Streptococcus agalactiae NOT DETECTED NOT DETECTED   Streptococcus pneumoniae NOT DETECTED NOT DETECTED   Streptococcus pyogenes NOT DETECTED NOT DETECTED   Acinetobacter baumannii NOT DETECTED NOT DETECTED   Enterobacteriaceae species NOT DETECTED NOT DETECTED   Enterobacter cloacae complex NOT DETECTED NOT DETECTED   Escherichia coli NOT DETECTED NOT DETECTED   Klebsiella oxytoca NOT DETECTED NOT DETECTED   Klebsiella pneumoniae NOT DETECTED NOT DETECTED   Proteus species NOT DETECTED NOT DETECTED   Serratia marcescens NOT DETECTED NOT DETECTED   Haemophilus influenzae NOT DETECTED NOT DETECTED   Neisseria meningitidis NOT DETECTED NOT DETECTED   Pseudomonas aeruginosa NOT DETECTED NOT DETECTED   Candida albicans NOT DETECTED NOT DETECTED   Candida glabrata NOT DETECTED NOT DETECTED   Candida krusei NOT DETECTED NOT DETECTED   Candida parapsilosis NOT DETECTED NOT DETECTED   Candida tropicalis NOT DETECTED NOT DETECTED     Narda Bonds 06/15/2018  4:41 AM

## 2018-06-15 NOTE — Progress Notes (Signed)
[facility] Progress Note  Patient: Kelly [name] JFH:[id number]   PCP: [facility] DOB: 13-Sep-1985   DOA: 06/14/2018   DOS: 06/15/2018   Date of Service: the patient was seen and examined on 06/15/2018  Brief hospital course: Pt. 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  Patient is [redacted] weeks pregnant Currently further plan is current antibiotic and supportive measures.  Subjective: Continues to complain of withdrawal symptoms with nausea shortness of breath and loose bowel movement.  Also reports anxiety.  No fever no chills right now. Reports heavy breathing as well as cough.  No further back pain right now Mentions that she uses 2 mg of heroin.  Telemetry: Sinus tachycardia  Assessment and Plan: 1.  Sepsis secondary to multifocal pneumonia. Streptococcal bacteremia. IV drug abuse. 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 History of epidural abscess. Presents with cough malaise. Tachycardia with hypotension as well as leukocytosis. Elevated lactic acid. Chest x-ray was performed which shows multifocal pneumonia. Blood culture positive for streptococcal. Patient was initially started on IV vancomycin Rocephin and azithromycin will transition her to IV Rocephin. Follow-up on sputum culture and influenza PCR. We will get echocardiogram. Will probably require TEE. MRI of the lumbar spine was negative for any acute abnormality at this time.  2.  IV heroin abuse. History of polysubstance abuse. Patient reports that she was on chronic methadone therapy and she stopped going to the methadone clinic in December 2019. She reports that she was 100 mg of  methadone. Reportedly she used to go to [facility] in [place] which I am unable to reach on Sunday and have left a voicemail. Last documentation of methadone use is in the hospital in September 2019 at which time she was on 60 mg of methadone. Patient reports that she has been using milligrams of IV heroin. She is requesting to go to rehabilitation program on discharge. I have discussed with OB/GYN attending on call. They feel that giving last documented methadone dose here in the hospital should not harm the fetus nor the patient and would recommend to use that dose. Knowing that physiologically there will be demand for frequent dosing I will divide her 60 mg methadone dose to 20 mg 3 times daily. Monitor QTC while on methadone. Patient was initially started on clonidine although OB/GYN recommended to monitor blood pressure as that can harm placental circulation therefore will be holding off on the clonidine. Continue Phenergan for nausea and Zofran for refractory nausea. Benadryl for itching as well as other withdrawal symptoms. Patient reports that she has allergic reaction to Suboxone although on further questioning she mentions that she started having withdrawal symptoms while she was on Suboxone therapy and never had any rash or throat swelling that required hospitalization. Patient was clearly informed that there is no way that we can provide methadone on discharge prescription and therefore she has to contact her methadone clinic prior to discharge, will discuss the case with case manager and social worker to assist the patient.  3.  23 weeks pregnancy. Patient reports that she is [redacted] weeks pregnant, by ultrasound she is [redacted] weeks pregnant. Daily monitoring by OB RN with fetal Doppler monitoring. For any OB related complaints contact OB/GYN attending on call.  4.  Elevated LFT. Does not appear to be having like help syndrome.  With this is all appears to be all in response to severe  infection.  Will monitor.  Pain.  5.  Physiological anemia of pregnancy. Monitor. Likely the acute drop is secondary to dilution rather than acute bleeding.  Diet: Regular diet DVT Prophylaxis: subcutaneous Heparin  Advance goals of care discussion: full code  Family Communication: no family was present at bedside, at the time of interview.   Disposition:  Discharge to be determined.  Will likely need prolonged antibiotic course which needs to be completed here in the hospital.  Consultants: OB/GYN. Procedures: none  Scheduled Meds: . enoxaparin (LOVENOX) injection  40 mg Subcutaneous Daily  . methadone  20 mg Oral Q8H  . prenatal multivitamin  1 tablet Oral Q1200  . sodium chloride flush  3 mL Intravenous Q12H   Continuous Infusions: . sodium chloride 75 mL/hr at 06/15/18 1752  . cefTRIAXone (ROCEPHIN)  IV 2 g (06/15/18 0543)   PRN Meds: acetaminophen **OR** acetaminophen, benzonatate, diphenhydrAMINE, ondansetron (ZOFRAN) IV, promethazine Antibiotics: Anti-infectives (From admission, onward)   Start     Dose/Rate Route Frequency Ordered Stop   06/15/18 0700  vancomycin (VANCOCIN) IVPB 1000 mg/200 mL premix  Status:  Discontinued     1,000 mg 200 mL/hr over 60 Minutes Intravenous Every 8 hours 06/14/18 2216 06/15/18 0711   06/15/18 0500  cefTRIAXone (ROCEPHIN) 2 g in sodium chloride 0.9 % 100 mL IVPB     2 g 200 mL/hr over 30 Minutes Intravenous Every 24 hours 06/15/18 0445     06/14/18 2200  azithromycin (ZITHROMAX) 500 mg in sodium chloride 0.9 % 250 mL IVPB  Status:  Discontinued     500 mg 250 mL/hr over 60 Minutes Intravenous Every 24 hours 06/14/18 2134 06/15/18 0711   06/14/18 2200  cefTRIAXone (ROCEPHIN) 1 g in sodium chloride 0.9 % 100 mL IVPB  Status:  Discontinued     1 g 200 mL/hr over 30 Minutes Intravenous Every 24 hours 06/14/18 2134 06/15/18 0445   06/14/18 2200  vancomycin (VANCOCIN) 1,500 mg in sodium chloride 0.9 % 500 mL IVPB     1,500 mg 250 mL/hr  over 120 Minutes Intravenous  Once 06/14/18 2134 06/15/18 0235       Objective: Physical Exam: Vitals:   06/14/18 2145 06/14/18 2335 06/15/18 0015 06/15/18 1646  BP: 104/64 107/64 107/64 105/77  Pulse: (!) 103 96 96 77  Resp: (!) 22 (!) 22 (!) 22 16  Temp:   97.7 F (36.5 C) (!) 97.5 F (36.4 C)  TempSrc:   Oral Oral  SpO2: 90% 95% 96% 96%  Weight:   79.7 kg   Height:        Intake/Output Summary (Last 24 hours) at 06/15/2018 1806 Last data filed at 06/15/2018 1719 Gross per 24 hour  Intake 1685.48 ml  Output 2 ml  Net 1683.48 ml   Filed Weights   06/14/18 1421 06/15/18 0015  Weight: 77.1 kg 79.7 kg   General: Alert, Awake and Oriented to Time, Place and Person. Appear in marked distress, affect irritable Eyes: PERRL, Conjunctiva normal ENT: Oral Mucosa clear moist. Neck: difficult to assess  JVD, no Abnormal Mass Or lumps Cardiovascular: S1 and S2 Present, no Murmur, Peripheral Pulses Present Respiratory: increased respiratory effort, Bilateral Air entry equal and Decreased, no use of accessory muscle, bilateral  Crackles, no wheezes Abdomen: Bowel Sound present, Soft and no tenderness, no hernia Skin: no redness,  no Rash, no induration Extremities: bilateral  Pedal edema, no calf tenderness Neurologic: Grossly no focal neuro deficit. Bilaterally Equal motor strength  Data Reviewed: CBC: Recent Labs  Lab 06/14/18 1429 06/15/18 0042  WBC 15.9* 12.6*  NEUTROABS 13.7* 10.6*  HGB 10.2* 8.0*  HCT 32.9* 25.3*  MCV 85.7 84.3  PLT 415* 888   Basic Metabolic Panel: Recent Labs  Lab 06/14/18 1429 06/15/18 0042  NA 127* 131*  K 4.3 3.5  CL 91* 97*  CO2 21* 22  GLUCOSE 128* 123*  BUN 6 5*  CREATININE 0.62 0.60  CALCIUM 8.9 8.2*    Liver Function Tests: Recent Labs  Lab 06/14/18 1429 06/15/18 0042  AST 269* 231*  ALT 21 19  ALKPHOS 202* 161*  BILITOT 0.6 0.6  PROT 8.2* 6.6  ALBUMIN 2.3* 1.7*   No results for input(s): LIPASE, AMYLASE in the last 168  hours. No results for input(s): AMMONIA in the last 168 hours. Coagulation Profile: Recent Labs  Lab 06/14/18 1429  INR 0.99   Cardiac Enzymes: No results for input(s): CKTOTAL, CKMB, CKMBINDEX, TROPONINI in the last 168 hours. BNP (last 3 results) No results for input(s): PROBNP in the last 8760 hours. CBG: No results for input(s): GLUCAP in the last 168 hours. Studies: Mr Lumbar Spine Wo Contrast  Result Date: 06/14/2018 CLINICAL DATA:  33 year old female with history of IV drug abuse, spinal MSSA infection in July 2019. Back pain and rapidly progressive neuro deficit. Chronic posterior spinal rods and lumbar pedicle screws. EXAM: MRI LUMBAR SPINE WITHOUT CONTRAST TECHNIQUE: Multiplanar, multisequence MR imaging of the lumbar spine was performed. No intravenous contrast was administered. COMPARISON:  01/28/2018 and earlier. FINDINGS: Segmentation: Normal thoracic and lumbar spine segmentation demonstrated on 12/02/2017 CTs. Alignment: Stable scoliosis with straightening of lumbar lordosis and visible thoracic kyphosis. Vertebrae: Extensive hardware susceptibility artifact again noted from the lower thoracic spine to L3-L4 limiting bone detail at those levels. No L4, L5, or visible sacral marrow edema or acute osseous abnormality. Conus medullaris and cauda equina: Lower thoracic spinal cord and conus poorly visible as before. Cauda equina nerve roots are visible from L4 to the sacrum and appear normal today. Paraspinal and other soft tissues: Distended urinary bladder, similar to the 12/02/2017 CT (series 5, image 9). Otherwise negative visible abdominal viscera. Postoperative changes to the lower posterior lumbar paraspinal soft tissues with no postoperative fluid collection identified. Disc levels: Grossly stable given limited visualization from spinal hardware artifact from the lower thoracic levels through L2-L3. L3-L4: Chronic facet hypertrophy. No definite disc abnormality. Chronic mild to  moderate left L3 foraminal stenosis appears stable. No spinal stenosis. L4-L5: Mild disc desiccation and circumferential disc bulge. Posterior decompression with mild to moderate residual facet hypertrophy. The thecal sac and epidural space at this level today appear normal. Mild to moderate left L4 foraminal stenosis is stable. L5-S1: Partial discectomy and posterior decompression redemonstrated with moderate residual facet hypertrophy. Epidural space is stable. No spinal stenosis. Moderate left L5 foraminal stenosis is stable. IMPRESSION: 1. No strong evidence of acute infection in the lumbar spine or visible sacrum. Stable MRI appearance of the lumbar spine since September 2019. 2. Distended urinary bladder. 3. Extensive chronic spinal hardware artifact from the lower thoracic levels through L3, and postoperative changes at L4-L5 and L5-S1. 4. 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 Electronically Signed   By: Genevie Ann M.D.   On: 06/14/2018 19:52     Time spent: Evaluated and discussed the  plan with the patient between 7:20 and 8 AM, 8:40 and 9 AM.  Discussed the plan of care with pharmacy, RN, called OB attending twice.  Author: Berle Mull, MD [facility] 06/15/2018 6:06 PM  Between 7PM-7AM, please contact night-coverage at [email or web address]

## 2018-06-15 NOTE — Progress Notes (Signed)
FHR 159 by doppler. No vaginal bleeding or leaking of fluid.Pt denies uc's. None palpated.

## 2018-06-15 NOTE — Progress Notes (Signed)
Spoke w patient at bedside. She states that she has not started prenatal care, is [redacted] weeks pregnant. She also states her last heroin use was Friday 06/13/2018. She states she uses herion IV.  She states she feels like her endocarditis is back because of how sick she feels. She states she is not currently linked to a methadone clinic, and that she is not getting enough here because she feels she is withdrawing "really bad." I will pass this on to attending. She states that she is wanting to leave AMA due to her withdrawal symptoms. When asked where she would go she said would call someone and go to a hotel. She politely asked to defer further conversation until she felt better. CM will continue to follow.

## 2018-06-15 NOTE — Progress Notes (Signed)
The chaplain responded to Pt. spiritual care consult.  Before visiting the chaplain checked the Pt. chart and talked to the Pt. RN-Maggie.  The chaplain will F/U with the spiritual care visit later in the day.

## 2018-06-16 ENCOUNTER — Inpatient Hospital Stay (HOSPITAL_COMMUNITY): Payer: Medicaid Other

## 2018-06-16 DIAGNOSIS — I361 Nonrheumatic tricuspid (valve) insufficiency: Secondary | ICD-10-CM

## 2018-06-16 LAB — COMPREHENSIVE METABOLIC PANEL
ALT: 16 U/L (ref 0–44)
AST: 113 U/L — AB (ref 15–41)
Albumin: 1.5 g/dL — ABNORMAL LOW (ref 3.5–5.0)
Alkaline Phosphatase: 147 U/L — ABNORMAL HIGH (ref 38–126)
Anion gap: 9 (ref 5–15)
BUN: 8 mg/dL (ref 6–20)
CO2: 24 mmol/L (ref 22–32)
Calcium: 8.1 mg/dL — ABNORMAL LOW (ref 8.9–10.3)
Chloride: 107 mmol/L (ref 98–111)
Creatinine, Ser: 0.58 mg/dL (ref 0.44–1.00)
GFR calc Af Amer: >60 mL/min (ref >60)
GFR calc non Af Amer: >60 mL/min (ref >60)
GLUCOSE: 84 mg/dL (ref 70–99)
Potassium: 3.9 mmol/L (ref 3.5–5.1)
Sodium: 140 mmol/L (ref 135–145)
Total Bilirubin: 0.1 mg/dL — ABNORMAL LOW (ref 0.3–1.2)
Total Protein: 6 g/dL — ABNORMAL LOW (ref 6.5–8.1)

## 2018-06-16 LAB — CBC WITH DIFFERENTIAL/PLATELET
ABS IMMATURE GRANULOCYTES: 0.08 10*3/uL — AB (ref 0.00–0.07)
Basophils Absolute: 0 10*3/uL (ref 0.0–0.1)
Basophils Relative: 0 %
Eosinophils Absolute: 0.1 10*3/uL (ref 0.0–0.5)
Eosinophils Relative: 1 %
HCT: 23.9 % — ABNORMAL LOW (ref 36.0–46.0)
Hemoglobin: 7.4 g/dL — ABNORMAL LOW (ref 12.0–15.0)
Immature Granulocytes: 1 %
Lymphocytes Relative: 27 %
Lymphs Abs: 2 10*3/uL (ref 0.7–4.0)
MCH: 27.1 pg (ref 26.0–34.0)
MCHC: 31 g/dL (ref 30.0–36.0)
MCV: 87.5 fL (ref 80.0–100.0)
Monocytes Absolute: 0.5 10*3/uL (ref 0.1–1.0)
Monocytes Relative: 6 %
Neutro Abs: 4.9 10*3/uL (ref 1.7–7.7)
Neutrophils Relative %: 65 %
Platelets: 420 10*3/uL — ABNORMAL HIGH (ref 150–400)
RBC: 2.73 MIL/uL — ABNORMAL LOW (ref 3.87–5.11)
RDW: 15.7 % — ABNORMAL HIGH (ref 11.5–15.5)
WBC: 7.5 10*3/uL (ref 4.0–10.5)
nRBC: 0 % (ref 0.0–0.2)

## 2018-06-16 LAB — ECHOCARDIOGRAM COMPLETE
HEIGHTINCHES: 67 in
Weight: 2821.89 oz

## 2018-06-16 LAB — INFLUENZA PANEL BY PCR (TYPE A & B)
Influenza A By PCR: NEGATIVE
Influenza B By PCR: POSITIVE — AB

## 2018-06-16 LAB — MAGNESIUM: Magnesium: 1.8 mg/dL (ref 1.7–2.4)

## 2018-06-16 LAB — LEGIONELLA PNEUMOPHILA SEROGP 1 UR AG: L. pneumophila Serogp 1 Ur Ag: NEGATIVE

## 2018-06-16 MED ORDER — METHADONE HCL 10 MG PO TABS
30.0000 mg | ORAL_TABLET | Freq: Every day | ORAL | Status: DC
  Administered 2018-06-16 – 2018-06-17 (×2): 30 mg via ORAL
  Filled 2018-06-16 (×2): qty 3

## 2018-06-16 MED ORDER — OSELTAMIVIR PHOSPHATE 75 MG PO CAPS
75.0000 mg | ORAL_CAPSULE | Freq: Two times a day (BID) | ORAL | Status: AC
  Administered 2018-06-16 – 2018-06-20 (×9): 75 mg via ORAL
  Filled 2018-06-16 (×10): qty 1

## 2018-06-16 MED ORDER — DEXTROMETHORPHAN POLISTIREX ER 30 MG/5ML PO SUER
30.0000 mg | Freq: Once | ORAL | Status: DC
  Filled 2018-06-16: qty 5

## 2018-06-16 MED ORDER — METHADONE HCL 10 MG PO TABS
40.0000 mg | ORAL_TABLET | Freq: Every day | ORAL | Status: DC
  Administered 2018-06-17 – 2018-06-18 (×2): 40 mg via ORAL
  Filled 2018-06-16 (×2): qty 4

## 2018-06-16 MED ORDER — DEXTROMETHORPHAN POLISTIREX ER 30 MG/5ML PO SUER: 30.0000 mg | Freq: Two times a day (BID) | ORAL | Status: DC | PRN

## 2018-06-16 NOTE — Progress Notes (Signed)
CSW acknowledges consult. CSW provided the patient with a list of substance abuse treatment list. Also gave patient information to [place], a substance abuse treatment program for mothers.   Patient is currently pregnant. Patient stated that she did not have any other needs at this time.   CSW signing off.   Domenic Schwab, MSW, [place]

## 2018-06-16 NOTE — Plan of Care (Signed)
  Problem: Education: Goal: Knowledge of General Education information will improve Description Including pain rating scale, medication(s)/side effects and non-pharmacologic comfort measures Outcome: Not Progressing   Problem: Health Behavior/Discharge Planning: Goal: Ability to manage health-related needs will improve Outcome: Not Progressing   Problem: Clinical Measurements: Goal: Respiratory complications will improve Outcome: Not Progressing   Problem: Coping: Goal: Level of anxiety will decrease Outcome: Not Progressing

## 2018-06-16 NOTE — Progress Notes (Signed)
FHR 152 BPM by doppler. Pt's RN says she has not seen any vaginal bleeding when taking pt to the bathroom.

## 2018-06-16 NOTE — Progress Notes (Signed)
[facility] Progress Note  Patient: Kelly [name] HKV:[id number]   PCP: [facility] DOB: 04-12-1986   DOA: 06/14/2018   DOS: 06/16/2018   Date of Service: the patient was seen and examined on 06/16/2018  Brief hospital course: Pt. 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  Patient is [redacted] weeks pregnant Currently further plan is current antibiotic and supportive measures.  Subjective: Continues to complain of withdrawal symptoms with nausea shortness of breath and loose bowel movement.  Although when I asked her to quantify she had one bowel movement today and no further vomiting today.  Also reports anxiety.  No fever no chills right now. I called [facility] and verify that the patient's last dose of methadone there was on April 06, 2018 70 mg.  Telemetry: Sinus tachycardia  Assessment and Plan: 1.  Sepsis secondary to multifocal pneumonia. Streptococcal bacteremia. Influenza B infection IV drug abuse. 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 History of epidural abscess. Presents with cough malaise. Tachycardia with hypotension as well as leukocytosis. Elevated lactic acid. Chest x-ray was performed which shows multifocal pneumonia. Blood culture positive for streptococcal.  Repeat cultures performed on 06/16/2018. Patient was initially started on IV vancomycin Rocephin and azithromycin transition her to IV Rocephin. This is improving with IV antibiotics. Follow-up on sputum culture and influenza PCR. Echocardiogram shows worsening vegetation 10 mm x 20 mm which was 8 mm x 9 mm last  time. Systolic function normal 6 to 65%. 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 MRI of the lumbar spine was negative for any acute abnormality at this time. ID is consulted. We will discuss with cardiothoracic surgery as well. Attempt to reach [facility] on-call not successful as they were busy with C-section, will call them again tomorrow. Tamiflu added to the regimen after discussing with the pharmacy for its safety during pregnancy.  2.  IV heroin abuse. History of polysubstance abuse. Patient reports that she was on chronic methadone therapy and she stopped going to the methadone clinic in December 2019. She reports that she was 100 mg of methadone. she used to go to [facility] in [place] Discussed with [facility] clinic person, last methadone dose was 70 mg on April 06, 2018.  No longer a patient there. Patient reports that she has been using 56mlligrams of IV heroin. She is requesting to go to rehabilitation program on discharge. I have discussed with OB/GYN attending on call. They feel that giving last documented methadone dose here in the hospital should not harm the fetus nor the patient and would recommend to use that dose. Knowing that physiologically there will be demand for frequent dosing I will divide her 70 mg methadone dose to 40 mg in the morning and 30 mg in the afternoon. Monitor QTC while on methadone.  Currently stable. Continue Phenergan for nausea and Zofran for refractory nausea. Benadryl for itching as well as other withdrawal symptoms. Patient reports that she has allergic reaction to Suboxone although on further questioning she mentions that she started having withdrawal symptoms while she was on Suboxone therapy and never had any rash or throat swelling that required hospitalization. Patient was clearly informed  that there is no way that we can provide methadone on discharge prescription and therefore she has to contact her methadone clinic prior  to discharge,  Discussed the case with the social worker, they provided the patient information regarding [facility] substance abuse treatment program for mothers. We will involve case management from [facility] to see if they have any more assistance for the patient.  3.  20 weeks pregnancy. by ultrasound she is [redacted] weeks pregnant. Daily monitoring by OB RN with fetal Doppler monitoring. For any OB related complaints contact OB/GYN attending on call.  4.  Elevated LFT. Does not appear to be having like helLp syndrome. No abdominal pain right now. this is all appears to be all in response to severe infection.  Now trending down.  5.  Physiological anemia of pregnancy. Monitor. Likely the acute drop is secondary to dilution rather than acute bleeding.  Diet: Regular diet DVT Prophylaxis: subcutaneous Heparin  Advance goals of care discussion: full code  Family Communication: no family was present at bedside, at the time of interview.   Disposition:  Discharge to be determined.  Will likely need prolonged antibiotic course which needs to be completed here in the hospital.  Consultants: OB/GYN.,  ID, left call for CT surgery. Procedures:  Echocardiogram 06/16/2018. Tricuspid regurgitation severe by color flow Doppler.  There is a large mobile tricuspid valve vegetation on the anterior leaflet.  The TV vegetation measures 10 mm x 20 mm. The left ventricle has normal systolic function of 81-19%.  Scheduled Meds: . enoxaparin (LOVENOX) injection  40 mg Subcutaneous Daily  . methadone  30 mg Oral QHS  . [START ON 06/17/2018] methadone  40 mg Oral Daily  . oseltamivir  75 mg Oral BID  . prenatal multivitamin  1 tablet Oral Q1200  . sodium chloride flush  3 mL Intravenous Q12H   Continuous Infusions: . sodium chloride 75 mL/hr at 06/16/18 0658  . cefTRIAXone (ROCEPHIN)  IV 2 g (06/16/18 0544)   PRN Meds: acetaminophen **OR** acetaminophen, benzonatate, diphenhydrAMINE,  ondansetron (ZOFRAN) IV, promethazine Antibiotics: Anti-infectives (From admission, onward)   Start     Dose/Rate Route Frequency Ordered Stop   06/16/18 1300  oseltamivir (TAMIFLU) capsule 75 mg     75 mg Oral 2 times daily 06/16/18 1231 06/21/18 0959   06/15/18 0700  vancomycin (VANCOCIN) IVPB 1000 mg/200 mL premix  Status:  Discontinued     1,000 mg 200 mL/hr over 60 Minutes Intravenous Every 8 hours 06/14/18 2216 06/15/18 0711   06/15/18 0500  cefTRIAXone (ROCEPHIN) 2 g in sodium chloride 0.9 % 100 mL IVPB     2 g 200 mL/hr over 30 Minutes Intravenous Every 24 hours 06/15/18 0445     06/14/18 2200  azithromycin (ZITHROMAX) 500 mg in sodium chloride 0.9 % 250 mL IVPB  Status:  Discontinued     500 mg 250 mL/hr over 60 Minutes Intravenous Every 24 hours 06/14/18 2134 06/15/18 0711   06/14/18 2200  cefTRIAXone (ROCEPHIN) 1 g in sodium chloride 0.9 % 100 mL IVPB  Status:  Discontinued     1 g 200 mL/hr over 30 Minutes Intravenous Every 24 hours 06/14/18 2134 06/15/18 0445   06/14/18 2200  vancomycin (VANCOCIN) 1,500 mg in sodium chloride 0.9 % 500 mL IVPB     1,500 mg 250 mL/hr over 120 Minutes Intravenous  Once 06/14/18 2134 06/15/18 0235       Objective: Physical Exam: Vitals:   06/15/18 2300 06/16/18 0533 06/16/18 1478  06/16/18 1700  BP: (!) 85/53  115/75 120/86  Pulse: 66  74 75  Resp: 16     Temp: 97.7 F (36.5 C)  (!) 97.5 F (36.4 C) 97.6 F (36.4 C)  TempSrc: Oral  Oral Oral  SpO2: 98%  97% 100%  Weight:  80 kg    Height:       No intake or output data in the 24 hours ending 06/16/18 1825 Filed Weights   06/14/18 1421 06/15/18 0015 06/16/18 0533  Weight: 77.1 kg 79.7 kg 80 kg   General: Alert, Awake and Oriented to Time, Place and Person. Appear in MODERATE distress, affect irritable Eyes: PERRL, Conjunctiva normal ENT: Oral Mucosa clear moist. Neck: difficult to assess  JVD, no Abnormal Mass Or lumps Cardiovascular: S1 and S2 Present, no Murmur, Peripheral  Pulses Present Respiratory: increased respiratory effort, Bilateral Air entry equal and Decreased, no use of accessory muscle, bilateral  Crackles, OCCASIONAL wheezes Abdomen: Bowel Sound present, Soft and no tenderness, no hernia Skin: no redness, no Rash, no induration Extremities: bilateral  Pedal edema, no calf tenderness Neurologic: Grossly no focal neuro deficit. Bilaterally Equal motor strength  Data Reviewed: CBC: Recent Labs  Lab 06/14/18 1429 06/15/18 0042 06/16/18 0935  WBC 15.9* 12.6* 7.5  NEUTROABS 13.7* 10.6* 4.9  HGB 10.2* 8.0* 7.4*  HCT 32.9* 25.3* 23.9*  MCV 85.7 84.3 87.5  PLT 415* 367 086*   Basic Metabolic Panel: Recent Labs  Lab 06/14/18 1429 06/15/18 0042 06/16/18 0935  NA 127* 131* 140  K 4.3 3.5 3.9  CL 91* 97* 107  CO2 21* 22 24  GLUCOSE 128* 123* 84  BUN 6 5* 8  CREATININE 0.62 0.60 0.58  CALCIUM 8.9 8.2* 8.1*  MG  --   --  1.8    Liver Function Tests: Recent Labs  Lab 06/14/18 1429 06/15/18 0042 06/16/18 0935  AST 269* 231* 113*  ALT 21 19 16   ALKPHOS 202* 161* 147*  BILITOT 0.6 0.6 0.1*  PROT 8.2* 6.6 6.0*  ALBUMIN 2.3* 1.7* 1.5*   No results for input(s): LIPASE, AMYLASE in the last 168 hours. No results for input(s): AMMONIA in the last 168 hours. Coagulation Profile: Recent Labs  Lab 06/14/18 1429  INR 0.99   Cardiac Enzymes: No results for input(s): CKTOTAL, CKMB, CKMBINDEX, TROPONINI in the last 168 hours. BNP (last 3 results) No results for input(s): PROBNP in the last 8760 hours. CBG: No results for input(s): GLUCAP in the last 168 hours. Studies: No results found.   Time spent: 35 minutes   Author: Berle Mull, MD [facility] 06/16/2018 6:25 PM  Between 7PM-7AM, please contact night-coverage at [email or web address]

## 2018-06-16 NOTE — Progress Notes (Signed)
   06/16/18 1400  Clinical Encounter Type  Visited With Patient  Visit Type Initial  Referral From Nurse  Consult/Referral To Chaplain  Spiritual Encounters  Spiritual Needs Other (Comment)  Stress Factors  Patient Stress Factors Other (Comment)   Responded to spiritual care consult. Before entering, Nurse state PT just had meds about half hour ago and that PT has the flu. I attempted to visit PT. PT stated she did not want anyone to visit at this time. Chaplain available upon request.  Chaplain Fidel Levy [phone]

## 2018-06-17 DIAGNOSIS — Z79899 Other long term (current) drug therapy: Secondary | ICD-10-CM

## 2018-06-17 DIAGNOSIS — D72829 Elevated white blood cell count, unspecified: Secondary | ICD-10-CM

## 2018-06-17 DIAGNOSIS — Z720 Tobacco use: Secondary | ICD-10-CM

## 2018-06-17 DIAGNOSIS — I330 Acute and subacute infective endocarditis: Secondary | ICD-10-CM

## 2018-06-17 DIAGNOSIS — Z8619 Personal history of other infectious and parasitic diseases: Secondary | ICD-10-CM

## 2018-06-17 DIAGNOSIS — B952 Enterococcus as the cause of diseases classified elsewhere: Secondary | ICD-10-CM

## 2018-06-17 DIAGNOSIS — M545 Low back pain: Secondary | ICD-10-CM

## 2018-06-17 DIAGNOSIS — I071 Rheumatic tricuspid insufficiency: Secondary | ICD-10-CM

## 2018-06-17 DIAGNOSIS — Z885 Allergy status to narcotic agent status: Secondary | ICD-10-CM

## 2018-06-17 DIAGNOSIS — F1190 Opioid use, unspecified, uncomplicated: Secondary | ICD-10-CM

## 2018-06-17 DIAGNOSIS — R7881 Bacteremia: Secondary | ICD-10-CM

## 2018-06-17 DIAGNOSIS — D649 Anemia, unspecified: Secondary | ICD-10-CM

## 2018-06-17 DIAGNOSIS — J101 Influenza due to other identified influenza virus with other respiratory manifestations: Secondary | ICD-10-CM | POA: Diagnosis present

## 2018-06-17 DIAGNOSIS — Z967 Presence of other bone and tendon implants: Secondary | ICD-10-CM

## 2018-06-17 DIAGNOSIS — Z8679 Personal history of other diseases of the circulatory system: Secondary | ICD-10-CM

## 2018-06-17 DIAGNOSIS — I361 Nonrheumatic tricuspid (valve) insufficiency: Secondary | ICD-10-CM

## 2018-06-17 DIAGNOSIS — M48061 Spinal stenosis, lumbar region without neurogenic claudication: Secondary | ICD-10-CM

## 2018-06-17 DIAGNOSIS — Z3A20 20 weeks gestation of pregnancy: Secondary | ICD-10-CM

## 2018-06-17 DIAGNOSIS — O99322 Drug use complicating pregnancy, second trimester: Secondary | ICD-10-CM

## 2018-06-17 DIAGNOSIS — Z8739 Personal history of other diseases of the musculoskeletal system and connective tissue: Secondary | ICD-10-CM

## 2018-06-17 DIAGNOSIS — I339 Acute and subacute endocarditis, unspecified: Secondary | ICD-10-CM

## 2018-06-17 HISTORY — DX: Influenza due to other identified influenza virus with other respiratory manifestations: J10.1

## 2018-06-17 LAB — GLUCOSE, CAPILLARY
Glucose-Capillary: 66 mg/dL — ABNORMAL LOW (ref 70–99)
Glucose-Capillary: 96 mg/dL (ref 70–99)

## 2018-06-17 LAB — COMPREHENSIVE METABOLIC PANEL
ALT: 11 U/L (ref 0–44)
AST: 34 U/L (ref 15–41)
Albumin: 1.5 g/dL — ABNORMAL LOW (ref 3.5–5.0)
Alkaline Phosphatase: 174 U/L — ABNORMAL HIGH (ref 38–126)
Anion gap: 11 (ref 5–15)
BUN: 6 mg/dL (ref 6–20)
CO2: 21 mmol/L — ABNORMAL LOW (ref 22–32)
Calcium: 8.1 mg/dL — ABNORMAL LOW (ref 8.9–10.3)
Chloride: 106 mmol/L (ref 98–111)
Creatinine, Ser: 0.5 mg/dL (ref 0.44–1.00)
GFR calc Af Amer: >60 mL/min (ref >60)
GFR calc non Af Amer: >60 mL/min (ref >60)
Glucose, Bld: 89 mg/dL (ref 70–99)
Potassium: 3.7 mmol/L (ref 3.5–5.1)
SODIUM: 138 mmol/L (ref 135–145)
Total Bilirubin: 0.3 mg/dL (ref 0.3–1.2)
Total Protein: 5.9 g/dL — ABNORMAL LOW (ref 6.5–8.1)

## 2018-06-17 LAB — CBC WITH DIFFERENTIAL/PLATELET
Abs Immature Granulocytes: 0.08 10*3/uL — ABNORMAL HIGH (ref 0.00–0.07)
Basophils Absolute: 0 10*3/uL (ref 0.0–0.1)
Basophils Relative: 0 %
Eosinophils Absolute: 0.1 10*3/uL (ref 0.0–0.5)
Eosinophils Relative: 1 %
HCT: 21.6 % — ABNORMAL LOW (ref 36.0–46.0)
HEMOGLOBIN: 6.6 g/dL — AB (ref 12.0–15.0)
Immature Granulocytes: 1 %
Lymphocytes Relative: 26 %
Lymphs Abs: 1.8 10*3/uL (ref 0.7–4.0)
MCH: 26.7 pg (ref 26.0–34.0)
MCHC: 30.6 g/dL (ref 30.0–36.0)
MCV: 87.4 fL (ref 80.0–100.0)
Monocytes Absolute: 0.5 10*3/uL (ref 0.1–1.0)
Monocytes Relative: 7 %
Neutro Abs: 4.7 10*3/uL (ref 1.7–7.7)
Neutrophils Relative %: 65 %
Platelets: 376 10*3/uL (ref 150–400)
RBC: 2.47 MIL/uL — AB (ref 3.87–5.11)
RDW: 15.2 % (ref 11.5–15.5)
WBC: 7.2 10*3/uL (ref 4.0–10.5)
nRBC: 0 % (ref 0.0–0.2)

## 2018-06-17 LAB — CBC
HCT: 24.9 % — ABNORMAL LOW (ref 36.0–46.0)
Hemoglobin: 7.9 g/dL — ABNORMAL LOW (ref 12.0–15.0)
MCH: 27.3 pg (ref 26.0–34.0)
MCHC: 31.7 g/dL (ref 30.0–36.0)
MCV: 86.2 fL (ref 80.0–100.0)
Platelets: 369 10*3/uL (ref 150–400)
RBC: 2.89 MIL/uL — ABNORMAL LOW (ref 3.87–5.11)
RDW: 15.3 % (ref 11.5–15.5)
WBC: 8.4 10*3/uL (ref 4.0–10.5)
nRBC: 0 % (ref 0.0–0.2)

## 2018-06-17 LAB — URINE CULTURE: Culture: >=100000 — AB

## 2018-06-17 LAB — PREPARE RBC (CROSSMATCH)

## 2018-06-17 MED ORDER — SODIUM CHLORIDE 0.9% IV SOLUTION
Freq: Once | INTRAVENOUS | Status: AC
  Administered 2018-06-17: 10:00:00 via INTRAVENOUS

## 2018-06-17 MED ORDER — SODIUM CHLORIDE 0.9 % IV SOLN
2.0000 g | Freq: Two times a day (BID) | INTRAVENOUS | Status: AC
  Administered 2018-06-17 – 2018-08-03 (×95): 2 g via INTRAVENOUS
  Filled 2018-06-17 (×97): qty 20

## 2018-06-17 MED ORDER — SODIUM CHLORIDE 0.9 % IV SOLN
2.0000 g | INTRAVENOUS | Status: AC
  Administered 2018-06-17 – 2018-08-03 (×284): 2 g via INTRAVENOUS
  Filled 2018-06-17 (×40): qty 2000
  Filled 2018-06-17: qty 2
  Filled 2018-06-17 (×63): qty 2000
  Filled 2018-06-17: qty 2
  Filled 2018-06-17 (×182): qty 2000

## 2018-06-17 MED ORDER — GUAIFENESIN ER 600 MG PO TB12: 600.0000 mg | ORAL_TABLET | Freq: Two times a day (BID) | ORAL | Status: DC

## 2018-06-17 NOTE — Consult Note (Addendum)
[address]       [place] [address]             [id number]        Kelly [name] [facility] Medical Record #[id number] Date of Birth: Oct 31, 1985  Referring: Dr Posey Pronto [facility]: [facility] [place] [facility] Cardiologist:No primary care provider on file.  Chief Complaint:    Chief Complaint  Patient presents with  . Back Pain    History of Present Illness: The patient is a 33 year old female admitted to the emergency department with a chief complaint of low back pain, cough and malaise.  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  Additionally, she has a history of an epidural abscess status post drainage and laminectomy in July 2019.  The patient is currently [redacted] weeks pregnant and presented to the ER for evaluation due to cough, malaise and increased low back pain with increased lower extremity numbness and urinary  incontinence.  She describes her back pain is similar to her previous requiring the surgical intervention last July.  This pain has been going on for approximately 1 week and is worsened over time.  She also has a productive cough, exertional dyspnea and generalized malaise.  She did have a period of abstinence from IV drug abuse before she relapsed in December but continued since that time.  She is followed by OB/GYN for her pregnancy.  A [place] MRI of the spine was done and showed no evidence of acute infection however did show chronic degenerative left-sided L3-L5 foraminal stenosis.  Echocardiogram has been performed and the results are below in the chart.  Lab work has been noted to be positive for influenza B, leukocytosis with a white blood cell count of 15.9 and a normocytic anemia.  She also was noted on chest x-ray to have a multifocal pneumonia.  Her antibiotics have been narrowed to ceftriaxone and Tamiflu and infectious diseases  assisting with management.  Blood cultures initially were positive for strep species and most recently updated to enterococcus faecalis.  Leukocytosis has normalized.  She does have some elevation of her LFTs that were not present during most recent hospitalization.  She has been restarted on methadone by the primary care team.  We are asked to see the patient in cardiothoracic surgical consultation due to the endocarditis for any and as to whether she is a candidate for surgical intervention.     Current Activity/ Functional Status: Patient is independent with mobility/ambulation, transfers, ADL's, IADL's.   Zubrod Score: At the time of surgery this patient's most appropriate activity status/level should be described as: []     0    Normal activity, no symptoms []     1    Restricted in physical strenuous activity but ambulatory, able to do out light work [x]     2    Ambulatory and capable of self care, unable to do work activities, up and about                 more than 50%  Of the time                            []     3    Only limited self care, in bed greater than 50% of waking hours []     4  Completely disabled, no self care, confined to bed or chair []     5    Moribund  Past Medical History:  Diagnosis Date  . 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 Abscess of breast    rt breast  . ADD (attention deficit disorder)   . Back pain   . Bipolar 1 disorder ([place])   . Hypertension   . Leukocytosis   . Polysubstance abuse ([place])   . Sepsis ([place]) 12/02/2017  . Suicidal overdose [facility])     Past Surgical History:  Procedure Laterality Date  . BACK SURGERY     scoliosis-in care everywhere 08/1999  . CESAREAN SECTION    . CHOLECYSTECTOMY  March 2015  . FOREIGN BODY REMOVAL Right 05/28/2016   Procedure: REMOVAL FOREIGN BODY RIGHT ARM;  Surgeon: Meredith Pel, MD;  Location: [place];  Service: Orthopedics;  Laterality: Right;  . IRRIGATION AND DEBRIDEMENT ABSCESS  Right 07/13/2014   Procedure: IRRIGATION AND DEBRIDEMEN TRIGHT BREAST ABSCESS;  Surgeon: Donnie Mesa, MD;  Location: [place];  Service: General;  Laterality: Right;  . LUMBAR LAMINECTOMY/DECOMPRESSION MICRODISCECTOMY N/A 12/05/2017   Procedure: LUMBAR LAMINECTOMY FOR EPIDURAL ABSCESS;  Surgeon: Newman Pies, MD;  Location: [place];  Service: Neurosurgery;  Laterality: N/A;  . RADIOLOGY WITH ANESTHESIA N/A 12/05/2017   Procedure: MRI WITH ANESTHESIA OF THORCIC AND LUMBAR SPINE WITH AND WITHOUT;  Surgeon: Radiologist, Medication, MD;  Location: [place];  Service: Radiology;  Laterality: N/A;    Social History   Tobacco Use  Smoking Status Current Some Day Smoker  . Packs/day: 1.00  . Years: 13.00  . Pack years: 13.00  . Types: Cigarettes  Smokeless Tobacco Never Used    Social History   Substance and Sexual Activity  Alcohol Use Yes   Comment: one or two drinks weekly     Allergies  Allergen Reactions  . Hydrocodone Itching  . Morphine And Related Nausea And Vomiting    Current Facility-Administered Medications  Medication Dose Route Frequency Provider Last Rate Last Dose  . 0.9 %  sodium chloride infusion (Manually program via Guardrails IV Fluids)   Intravenous Once Lavina Hamman, MD      . acetaminophen (TYLENOL) tablet 650 mg  650 mg Oral Q6H PRN Vianne Bulls, MD   650 mg at 06/17/18 [phone]   Or  . acetaminophen (TYLENOL) suppository 650 mg  650 mg Rectal Q6H PRN Opyd, Ilene Qua, MD      . benzonatate (TESSALON) capsule 200 mg  200 mg Oral TID PRN Lovey Newcomer T, NP   200 mg at 06/16/18 2118  . cefTRIAXone (ROCEPHIN) 2 g in sodium chloride 0.9 % 100 mL IVPB  2 g Intravenous Q24H Erenest Blank, RPH   Stopped at 06/17/18 0445  . dextromethorphan (DELSYM) 30 MG/5ML liquid 30 mg  30 mg Oral Once Kirby-Graham, Karsten Fells, NP      . diphenhydrAMINE (BENADRYL) injection 25 mg  25 mg Intravenous Q6H PRN Lavina Hamman, MD   25 mg at 06/17/18 [phone]  . enoxaparin (LOVENOX) injection  40 mg  40 mg Subcutaneous Daily Opyd, Ilene Qua, MD   40 mg at 06/17/18 0820  . guaiFENesin (MUCINEX) 12 hr tablet 600 mg  600 mg Oral BID Lavina Hamman, MD      . methadone (DOLOPHINE) tablet 30 mg  30 mg Oral QHS Lavina Hamman, MD   30 mg at 06/16/18 2114  . methadone (DOLOPHINE) tablet 40  mg  40 mg Oral Daily Lavina Hamman, MD   40 mg at 06/17/18 8453  . ondansetron (ZOFRAN) injection 4 mg  4 mg Intravenous Q6H PRN Lavina Hamman, MD   4 mg at 06/17/18 0820  . oseltamivir (TAMIFLU) capsule 75 mg  75 mg Oral BID Lavina Hamman, MD   75 mg at 06/17/18 6468  . prenatal multivitamin tablet 1 tablet  1 tablet Oral Q1200 Opyd, Ilene Qua, MD   1 tablet at 06/16/18 1408  . promethazine (PHENERGAN) injection 12.5-25 mg  12.5-25 mg Intravenous Q6H PRN Lovey Newcomer T, NP   12.5 mg at 06/17/18 0411  . sodium chloride flush (NS) 0.9 % injection 3 mL  3 mL Intravenous Q12H Opyd, Ilene Qua, MD   3 mL at 06/17/18 0321    Medications Prior to Admission  Medication Sig Dispense Refill Last Dose  . acetaminophen (TYLENOL) 500 MG tablet Take 2 tablets (1,000 mg total) by mouth every 8 (eight) hours. (Patient not taking: Reported on 04/01/2018) 30 tablet 0 Not Taking at Unknown time  . ferrous sulfate 325 (65 FE) MG tablet Take 1 tablet (325 mg total) by mouth daily with breakfast. (Patient not taking: Reported on 04/01/2018) 30 tablet 0 Not Taking at Unknown time  . magnesium oxide (MAG-OX) 400 (241.3 Mg) MG tablet Take 1 tablet (400 mg total) by mouth 2 (two) times daily. (Patient not taking: Reported on 04/01/2018) 60 tablet 0 Not Taking at Unknown time  . methadone (DOLOPHINE) 10 MG/5ML solution Take 30 mLs (60 mg total) by mouth daily. (Patient not taking: Reported on 05/08/2018)  0 Not Taking at Unknown time  . nicotine polacrilex (NICORETTE) 2 MG gum Take 1 each (2 mg total) by mouth as needed for smoking cessation. (Patient not taking: Reported on 04/01/2018) 100 tablet 0 Not Taking at Unknown time    . pantoprazole (PROTONIX) 40 MG tablet Take 1 tablet (40 mg total) by mouth daily. (Patient not taking: Reported on 04/01/2018) 30 tablet 0 Not Taking at Unknown time  . polyethylene glycol (MIRALAX / GLYCOLAX) packet Take 17 g by mouth 2 (two) times daily. (Patient not taking: Reported on 04/01/2018) 60 each 0 Not Taking at Unknown time  . promethazine (PHENERGAN) 25 MG tablet Take 1 tablet (25 mg total) by mouth every 6 (six) hours as needed for nausea or vomiting. (Patient not taking: Reported on 06/14/2018) 30 tablet 0 Not Taking at Unknown time  . senna-docusate (SENOKOT-S) 8.6-50 MG tablet Take 1 tablet by mouth 2 (two) times daily. (Patient not taking: Reported on 04/01/2018) 60 tablet 0 Not Taking at Unknown time    Family History  Problem Relation Age of Onset  . Diabetes Mother   . Diabetes Father   . Hypertension Father   . Heart attack Neg Hx   . Hyperlipidemia Neg Hx   . Sudden death Neg Hx      Review of Systems:   Review of Systems  Constitutional: Positive for chills, diaphoresis, fever and malaise/fatigue. Negative for weight loss.  HENT: Positive for congestion, nosebleeds, sinus pain and sore throat. Negative for ear discharge, ear pain, hearing loss and tinnitus.   Eyes: Negative for blurred vision, double vision, photophobia, pain, discharge and redness.  Respiratory: Positive for cough, hemoptysis, sputum production, shortness of breath and wheezing. Negative for stridor.   Cardiovascular: Positive for chest pain, palpitations, orthopnea, claudication, leg swelling and PND.  Gastrointestinal: Positive for abdominal pain, constipation, diarrhea, nausea and vomiting. Negative for  blood in stool and heartburn.  Genitourinary: Positive for flank pain and urgency. Negative for dysuria and hematuria.       Incontinent of urine  Musculoskeletal: Positive for back pain, falls, joint pain, myalgias and neck pain.  Skin: Negative.   Neurological: Positive for dizziness,  tingling, sensory change, focal weakness, loss of consciousness, weakness and headaches. Negative for tremors, speech change and seizures.       Left side led weakness  Endo/Heme/Allergies: Positive for polydipsia. Negative for environmental allergies. Does not bruise/bleed easily.  Psychiatric/Behavioral: Positive for depression, memory loss, substance abuse and suicidal ideas. Negative for hallucinations. The patient is nervous/anxious and has insomnia.        Physical Exam: BP 114/81   Pulse 75   Temp 97.6 F (36.4 C) (Oral)   Resp 20   Ht 5' 7"  (1.702 m)   Wt 80 kg   LMP 12/30/2017 (Within Weeks)   SpO2 95%   BMI 27.62 kg/m    Physical Exam  Constitutional: She appears chronically ill and acutely ill.  HENT:  Mouth/Throat: Dental caries present. Oropharynx is clear. Pharynx is normal.  Poor lower jaw dentition  Eyes: Pupils are equal, round, and reactive to light. Conjunctivae are normal.  Neck: Normal range of motion and thyroid normal. Neck supple. JVD present. No neck adenopathy. No thyromegaly present.  Abnormal jugular pulsations  Pulmonary/Chest: No stridor. She has no wheezes. She has no rales. She exhibits no tenderness.  Coarse breath sounds throughout with rhonchi  Abdominal: Soft. She exhibits no distension and no mass. There is no splenomegaly or hepatomegaly. There is abdominal tenderness. No hernia.  Epigastric tenderness to palpation Abdomen protuberant insistent with pregnancy.  Musculoskeletal:        General: No tenderness, deformity or edema.  Neurological: She is alert and oriented to person, place, and time.  Numbness left lower extremity Gait not tested  Skin: Skin is warm. No rash noted. No cyanosis. No jaundice or pallor. Nails show no clubbing.    Diagnostic Studies & Laboratory data:     Recent Radiology Findings:   No results found.   I have independently reviewed the above radiologic studies and discussed with the patient   Recent Lab  Findings: Lab Results  Component Value Date   WBC 7.2 06/17/2018   HGB 6.6 (LL) 06/17/2018   HCT 21.6 (L) 06/17/2018   PLT 376 06/17/2018   GLUCOSE 89 06/17/2018   ALT 11 06/17/2018   AST 34 06/17/2018   NA 138 06/17/2018   K 3.7 06/17/2018   CL 106 06/17/2018   CREATININE 0.50 06/17/2018   BUN 6 06/17/2018   CO2 21 (L) 06/17/2018   TSH 4.219 12/22/2017   INR 0.99 06/14/2018   HGBA1C 4.9 05/24/2016      Procedure: 2D Echo  Indications:    [place]   History:        Patient has prior history of Echocardiogram examinations.                 Endocarditis. PNA, IV drug abuse, Flu, [redacted] weeks pregnant, TV                 endocarditis, MSSA, Sepsis.   Sonographer:    Dustin Flock Referring Phys: [id number] [name]    1. Tricuspid regurgitation severe by color flow Doppler. There is a large mobile tricuspid valve vegetation on the anterior leaflet. The TV vegetation measures 10 mm x 20 mm.  2. The  left ventricle has normal systolic function of 27-25%. The cavity size is normal. There is no left ventricular wall thickness. Echo evidence of normal diastolic filling patterns.  3. The right ventricle is moderately enlarged in size. There is normal systolic function. Right ventricular systolic pressure is normal with an estimated pressure of 43.8 mmHg.  4. Mildly dilated left atrial size.  5. Moderately dilated right atrial size.  6. The mitral valve is myxomatous. There is mild thickening. Regurgitation is not visualized by color flow Doppler.  7. Large vegetation on the tricuspid valve.  8. Myxomatous tricuspid valve.  9. Tricuspid regurgitation severe. 10. No atrial level shunt detected by color flow Doppler.  FINDINGS  Left Ventricle: No evidence of left ventricular regional wall motion abnormalities. The left ventricle has normal systolic function of 36-64%. The cavity size is normal. There is no left ventricular wall thickness. Echo evidence of normal  diastolic  filling patterns. Right Ventricle: The right ventricle is moderately enlarged in size. There is normal hypertrophy. There is normal systolic function. Right ventricular systolic pressure is normal with an estimated pressure of 43.8 mmHg. Left Atrium: The left atrium is mildly dilated. Right Atrium: The right atrial size is moderately dilated. Interatrial Septum: No atrial level shunt detected by color flow Doppler.  Pericardium: There is no evidence of pericardial effusion. Mitral Valve: The mitral valve is myxomatous. There is mild thickening. Regurgitation is not visualized by color flow Doppler. Tricuspid Valve: The tricuspid valve is myxomatous. Tricuspid regurgitation severe by color flow Doppler. There is a large mobile tricuspid valve vegetation on the anterior leaflet. The TV vegetation measures 10 mm x 20 mm. Aortic Valve: The aortic valve normal in structure and function. Pulmonic Valve: The pulmonic valve is normal. Pulmonic valve regurgitation is not visualized by color flow Doppler. Venous: The inferior vena cava was normal in size with greater than 50% respiratory variablity.   LEFT VENTRICLE PLAX 2D ([place]) LV EF:          33.7 %   Diastology LVIDd:          4.40 cm  LV e' lateral:   15.70 cm/s LVIDs:          3.70 cm  LV E/e' lateral: 6.3 LV PW:          1.10 cm  LV e' medial:    12.20 cm/s LV IVS:         1.10 cm  LV E/e' medial:  8.1 LVOT diam:      2.10 cm LV SV:          30 ml LVOT Area:      3.46 cm  RIGHT VENTRICLE RV Basal diam:  2.30 cm RV S prime:     15.80 cm/s TAPSE (M-mode): 2.2 cm RVSP:           43.8 mmHg  LEFT ATRIUM             Index       RIGHT ATRIUM           Index LA diam:        3.70 cm 1.93 cm/m  RA Pressure: 10 mmHg LA Vol (A2C):   40.9 ml 21.34 ml/m RA Area:     21.50 cm LA Vol (A4C):   68.8 ml 35.89 ml/m RA Volume:   76.30 ml  39.80 ml/m LA Biplane Vol: 54.8 ml 28.59 ml/m  AORTIC VALVE LVOT Vmax:   102.00 cm/s LVOT  Vmean:  77.900  cm/s LVOT VTI:    0.247 m   AORTA Ao Root diam: 2.70 cm  MITRAL VALVE              TR Peak grad: 33.8 mmHg MV Area (PHT): 4.31 cm   TR Vmax:      311.00 cm/s MV PHT:        51.04 msec RVSP:         43.8 mmHg MV Decel Time: 176 msec MV E velocity: 99.00 cm/s MV A velocity: 52.70 cm/s MV E/A ratio:  1.88    Candee Furbish MD Electronically signed by Candee Furbish MD Signature Date/Time: 06/16/2018/4:17:37 PM   ECHO 6 months ago: Transthoracic Echocardiography  Patient:    Tassie, Pollett MR #:       [id number] Study Date: 01/28/2018 Gender:     F Age:        31 Height:     170.2 cm Weight:     87.3 kg BSA:        2.06 m^2 Pt. Status: Room:       6N03C   ADMITTING    Elgergawy, Dawood S  PERFORMING   [facility], Inpatient  ATTENDING    Arrien, Mauricio  ORDERING     Arrien, Mauricio  REFERRING    Arrien, Mauricio  SONOGRAPHER  Abigail Butts [place]  cc:  ------------------------------------------------------------------- LV EF: 60% -   65%  ------------------------------------------------------------------- Indications:      Endocarditis 421.9.  ------------------------------------------------------------------- Study Conclusions  - Left ventricle: The cavity size was normal. Systolic function was   normal. The estimated ejection fraction was in the range of 60%   to 65%. Wall motion was normal; there were no regional wall   motion abnormalities. - Aortic valve: Transvalvular velocity was within the normal range.   There was no stenosis. There was no regurgitation. - Mitral valve: Transvalvular velocity was within the normal range.   There was no evidence for stenosis. There was trivial   regurgitation. - Left atrium: The atrium was mildly dilated. - Right ventricle: The cavity size was normal. Wall thickness was   normal. Systolic function was normal. - Right atrium: The atrium was moderately dilated. - Atrial septum: No defect or patent foramen  ovale was identified. - Tricuspid valve: Mobile vegetation noted on the anterior   tricuspid valve leaflet (0.8 cm x 0.6 cm). There was severe   regurgitation. - Pulmonary arteries: Systolic pressure was within the normal   range. PA peak pressure: 38 mm Hg (S).  Impressions:  - Compared with the echo 12/03/17 the tricuspid valve vegetation is   smaller.  ------------------------------------------------------------------- Study data:  Comparison was made to the study of 12/03/2017.  Study status:  Routine.  Procedure:  The patient reported no pain pre or post test. Transthoracic echocardiography. Image quality was good. Study completion:  There were no complications. Transthoracic echocardiography.  M-mode, complete 2D, spectral Doppler, and color Doppler.  Birthdate:  Patient birthdate: 1985-12-31.  Age:  Patient is 33 yr old.  Sex:  Gender: female. BMI: 30.1 kg/m^2.  Blood pressure:     102/76  Patient status: Inpatient.  Study date:  Study date: 01/28/2018. Study time: 02:09 PM.  Location:  ICU/CCU  -------------------------------------------------------------------  ------------------------------------------------------------------- Left ventricle:  The cavity size was normal. Systolic function was normal. The estimated ejection fraction was in the range of 60% to 65%. Wall motion was normal; there were no regional wall motion abnormalities.  ------------------------------------------------------------------- Aortic valve:   Trileaflet; normal thickness leaflets.  Mobility was not restricted.  Doppler:  Transvalvular velocity was within the normal range. There was no stenosis. There was no regurgitation.   ------------------------------------------------------------------- Aorta:  Aortic root: The aortic root was normal in size.  ------------------------------------------------------------------- Mitral valve:   Structurally normal valve.   Mobility was  not restricted.  Doppler:  Transvalvular velocity was within the normal range. There was no evidence for stenosis. There was trivial regurgitation.  ------------------------------------------------------------------- Left atrium:  The atrium was mildly dilated.  ------------------------------------------------------------------- Atrial septum:  No defect or patent foramen ovale was identified.   ------------------------------------------------------------------- Right ventricle:  The cavity size was normal. Wall thickness was normal. Systolic function was normal.  ------------------------------------------------------------------- Pulmonic valve:    Structurally normal valve.   Cusp separation was normal.  Doppler:  Transvalvular velocity was within the normal range. There was no evidence for stenosis. There was no regurgitation.  ------------------------------------------------------------------- Tricuspid valve:   Structurally normal valve.   Mobile vegetation noted on the anterior tricuspid valve leaflet (0.8 cm x 0.6 cm). Doppler:  Transvalvular velocity was within the normal range. There was severe regurgitation.  ------------------------------------------------------------------- Pulmonary artery:   The main pulmonary artery was normal-sized. Systolic pressure was within the normal range.  ------------------------------------------------------------------- Right atrium:  The atrium was moderately dilated.  ------------------------------------------------------------------- Pericardium:  There was no pericardial effusion.  ------------------------------------------------------------------- Systemic veins: Inferior vena cava: The vessel was normal in size. The respirophasic diameter changes were in the normal range (>= 50%), consistent with normal central venous pressure.  ------------------------------------------------------------------- Measurements    Left ventricle                          Value        Reference  LV ID, ED, PLAX chordal                 46    mm     43 - 52  LV ID, ES, PLAX chordal                 26    mm     23 - 38  LV fx shortening, PLAX chordal          43    %      >=29  LV PW thickness, ED                     6     mm     ----------  IVS/LV PW ratio, ED                     1.17         <=1.3    Ventricular septum                      Value        Reference  IVS thickness, ED                       7     mm     ----------    LVOT                                    Value        Reference  LVOT ID, S  20    mm     ----------  LVOT area                               3.14  cm^2   ----------    Aorta                                   Value        Reference  Aortic root ID, ED                      30    mm     ----------    Left atrium                             Value        Reference  LA ID, A-P, ES                          32    mm     ----------  LA ID/bsa, A-P                          1.56  cm/m^2 <=2.2  LA volume, S                            57.1  ml     ----------  LA volume/bsa, S                        27.8  ml/m^2 ----------  LA volume, ES, 1-p A4C                  51    ml     ----------  LA volume/bsa, ES, 1-p A4C              24.8  ml/m^2 ----------  LA volume, ES, 1-p A2C                  59.8  ml     ----------  LA volume/bsa, ES, 1-p A2C              29.1  ml/m^2 ----------    Pulmonary arteries                      Value        Reference  PA pressure, S, DP              (H)     38    mm Hg  <=30    Tricuspid valve                         Value        Reference  Tricuspid regurg peak velocity          295   cm/s   ----------  Tricuspid peak RV-RA gradient           35    mm Hg  ----------    Right atrium  Value        Reference  RA ID, S-I, ES, A4C             (H)     59.6  mm     34 - 49  RA area, ES, A4C                (H)     24.2  cm^2    8.3 - 19.5  RA volume, ES, A/L                      75.3  ml     ----------  RA volume/bsa, ES, A/L                  36.6  ml/m^2 ----------    Systemic veins                          Value        Reference  Estimated CVP                           3     mm Hg  ----------    Right ventricle                         Value        Reference  RV ID, minor axis, ED, A4C base         38    mm     ----------  RV ID, minor axis, ED, A4C mid          37    mm     ----------  RV ID, major axis, ED, A4C              84    mm     55 - 91  TAPSE                                   33.9  mm     ----------  RV pressure, S, DP              (H)     38    mm Hg  <=30  RV s&', lateral, S                       10.2  cm/s   ----------  Legend: (L)  and  (H)  mark values outside specified reference range.  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

## 2018-06-17 NOTE — Progress Notes (Signed)
CRITICAL VALUE ALERT  Critical Value:  HgB 6.6  Date & Time Notied: 0830  Provider Notified: yes   Orders Received/Actions taken: awaiting orders   [place]

## 2018-06-17 NOTE — Consult Note (Signed)
[facility] for Infectious Disease    Date of Admission:  06/14/2018     Total days of antibiotics 4               Reason for Consult: Streptococcus bacteremia  Referring Provider: Posey Pronto Primary Care Provider: [facility]   Assessment/Plan:  Kelly [name] is a 33 year old female [redacted] weeks pregnant with previous history of MSSA epidural abscess, bacteremia and tricuspid valve endocarditis who recently relapsed back to IV heroin use and found to have Influenza B and Enterococcus faecalis bacteremia. TTE with continued severe tricuspid valve regurgitation and vegetation which is of similar size as previous. MRI with no evidence of acute infection. Current conditioned complicated as she is [redacted] weeks pregnant. Now on methadone for opoid use disorder. Will increase dose of ceftriaxone to 2g q 12 and add ampicillin to cover for Enterococcal endocarditis. Repeat cultures are without growth to date. Agree with CVTS evaluation. Will need prolonged course of antibiotic therapy likely at least 6 weeks given Enterococcus infection. OB is following pregnancy and she is on a prenatal vitamin.  1. Change ceftriaxone to 2g q 12 and add ampicillin. 2. Continue Tamiflu.  3. Pregnancy care per OB and primary team 4. Continue with methadone for opioid use disorder per primary team. 5. Monitor repeat cultures for clearance.    Principal Problem:   Bacteremia due to Enterococcus Active Problems:   IV drug abuse complicating pregnancy ([place])   Severe tricuspid regurgitation by prior echocardiogram   Influenza B   Opioid use disorder, severe, dependence (HCC)   Sepsis due to pneumonia [name])   Multifocal pneumonia   Hyponatremia   Elevated transaminase level   Degenerative arthritis of lumbar spine   History of bacterial endocarditis   . dextromethorphan  30 mg Oral Once  . enoxaparin (LOVENOX) injection  40 mg Subcutaneous Daily  . methadone  30 mg Oral QHS  .  methadone  40 mg Oral Daily  . oseltamivir  75 mg Oral BID  . prenatal multivitamin  1 tablet Oral Q1200  . sodium chloride flush  3 mL Intravenous Q12H     HPI: Kelly [name] is a 33 y.o. female is known to the ID service with a previous medical history of IV drug use complicated by lumbar epidural abscess and tricuspid valve endocarditis s/p 8 weeks of Cefazolin for MSSA infection who was admitted on 2/1 for new onset cough, malaise, increasing low back pain and recurrent incontinence. She reports having had fevers, chills and sweats at home. Had also stopped going to the methadone clinic in December of 2019 and had returned to using heroin. She is currently [redacted] weeks pregnant.   Afebrile in the ED with chest x-ray concerning for multifocal pneumonia. Repeat MRI of the spine without evidence of acute infection and chronic degenerative left side L3-L5 foraminal stenosis. She does continue to have post-op hardware present. Lab work was positive for influenza B, leukocytosis of 15.9 and normocytic anemia. Echocardiogram completed showed continued vegetation of the tricupsid valve measuring 10 x 20 mm with severe tricuspid regurgitation.   She was started on Vancomycin, ceftriaxone and azithromycin, and Tamiflu and has been narrowed to Ceftriaxone and Tamiflu. Blood cultures initially positive for Streptococcus species and most recently updated to Enterococcus faecalis. She has remained afebrile since admission with now normal WBC count.   She continues to feel malaise and fatigue with concern for withdrawal. She has been restarted methadone  by the primary team and is questioning if the dosage should be increased. Denies any current fevers or chills but does have shortness of breath and cough.    Review of Systems: Review of Systems  Constitutional: Positive for malaise/fatigue. Negative for chills and fever.  Respiratory: Positive for cough, shortness of breath and wheezing. Negative for  hemoptysis.   Cardiovascular: Negative for chest pain and leg swelling.  Gastrointestinal: Positive for nausea. Negative for abdominal pain, constipation, diarrhea and vomiting.  Genitourinary: Negative for dysuria and frequency.  Musculoskeletal: Positive for myalgias.  Skin: Negative for rash.  Neurological: Negative for headaches.     Past Medical History:  Diagnosis Date  . 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 Abscess of breast    rt breast  . ADD (attention deficit disorder)   . Back pain   . Bipolar 1 disorder ([place])   . Hypertension   . Leukocytosis   . Polysubstance abuse ([place])   . Sepsis ([place]) 12/02/2017  . Suicidal overdose (HCC)     Social History   Tobacco Use  . Smoking status: Current Some Day Smoker    Packs/day: 1.00    Years: 13.00    Pack years: 13.00    Types: Cigarettes  . Smokeless tobacco: Never Used  Substance Use Topics  . Alcohol use: Yes    Comment: one or two drinks weekly  . Drug use: Yes    Types: Cocaine, Heroin, IV    Comment: heroin, crack cocaine last used 06/13/2018    Family History  Problem Relation Age of Onset  . Diabetes Mother   . Diabetes Father   . Hypertension Father   . Heart attack Neg Hx   . Hyperlipidemia Neg Hx   . Sudden death Neg Hx     Allergies  Allergen Reactions  . Hydrocodone Itching  . Morphine And Related Nausea And Vomiting    OBJECTIVE: Blood pressure 114/81, pulse 75, temperature 97.6 F (36.4 C), temperature source Oral, resp. rate 20, height 5' 7"  (1.702 m), weight 80 kg, last menstrual period 12/30/2017, SpO2 95 %, unknown if currently breastfeeding.  Physical Exam Constitutional:      General: She is not in acute distress.    Appearance: She is well-developed. She is ill-appearing.  Neck:     Musculoskeletal: Neck supple. No muscular tenderness.  Cardiovascular:     Rate and Rhythm: Normal rate and regular rhythm.     Heart sounds: Normal heart sounds.    Pulmonary:     Effort: Pulmonary effort is normal.     Breath sounds: Wheezing present. No rhonchi.  Skin:    General: Skin is warm and dry.  Neurological:     Mental Status: She is alert and oriented to person, place, and time.  Psychiatric:        Mood and Affect: Mood normal.     Lab Results Lab Results  Component Value Date   WBC 7.2 06/17/2018   HGB 6.6 (LL) 06/17/2018   HCT 21.6 (L) 06/17/2018   MCV 87.4 06/17/2018   PLT 376 06/17/2018    Lab Results  Component Value Date   CREATININE 0.50 06/17/2018   BUN 6 06/17/2018   NA 138 06/17/2018   K 3.7 06/17/2018   CL 106 06/17/2018   CO2 21 (L) 06/17/2018    Lab Results  Component Value Date   ALT 11 06/17/2018   AST 34 06/17/2018   ALKPHOS 174 (H) 06/17/2018  BILITOT 0.3 06/17/2018     Microbiology: Recent Results (from the past 240 hour(s))  Culture, blood (Routine x 2)     Status: None (Preliminary result)   Collection Time: 06/14/18  2:30 PM  Result Value Ref Range Status   Specimen Description BLOOD RIGHT ANTECUBITAL  Final   Special Requests   Final    BOTTLES DRAWN AEROBIC AND ANAEROBIC Blood Culture adequate volume   Culture  Setup Time   Final    GRAM POSITIVE COCCI IN CHAINS IN BOTH AEROBIC AND ANAEROBIC BOTTLES CRITICAL VALUE NOTED.  VALUE IS CONSISTENT WITH PREVIOUSLY REPORTED AND CALLED VALUE. Performed at [facility], [place] [address], [place], [place] [address]    Culture   Final    CULTURE REINCUBATED FOR BETTER GROWTH ENTEROCOCCUS FAECALIS    Report Status PENDING  Incomplete  Culture, blood (Routine x 2)     Status: None (Preliminary result)   Collection Time: 06/14/18  2:35 PM  Result Value Ref Range Status   Specimen Description BLOOD RIGHT ANTECUBITAL  Final   Special Requests   Final    BOTTLES DRAWN AEROBIC AND ANAEROBIC Blood Culture results may not be optimal due to an excessive volume of blood received in culture bottles   Culture  Setup Time   Final    GRAM POSITIVE  COCCI IN CHAINS IN BOTH AEROBIC AND ANAEROBIC BOTTLES CRITICAL RESULT CALLED TO, READ BACK BY AND VERIFIED WITH: J. LEDFORD,PHARMD [id number] 06/15/2018 T. TYSOR    Culture   Final    CULTURE REINCUBATED FOR BETTER GROWTH ENTEROCOCCUS FAECALIS SUSCEPTIBILITIES TO FOLLOW Performed at [facility], [place] [address], [place], [place] [address]    Report Status PENDING  Incomplete  Blood Culture ID Panel (Reflexed)     Status: Abnormal   Collection Time: 06/14/18  2:35 PM  Result Value Ref Range Status   Enterococcus species NOT DETECTED NOT DETECTED Final   Listeria monocytogenes NOT DETECTED NOT DETECTED Final   Staphylococcus species NOT DETECTED NOT DETECTED Final   Staphylococcus aureus (BCID) NOT DETECTED NOT DETECTED Final   Streptococcus species DETECTED (A) NOT DETECTED Final    Comment: Not Enterococcus species, Streptococcus agalactiae, Streptococcus pyogenes, or Streptococcus pneumoniae. CRITICAL RESULT CALLED TO, READ BACK BY AND VERIFIED WITH: J. LEDFORD,PHARMD [id number] 06/15/2018 T. TYSOR    Streptococcus agalactiae NOT DETECTED NOT DETECTED Final   Streptococcus pneumoniae NOT DETECTED NOT DETECTED Final   Streptococcus pyogenes NOT DETECTED NOT DETECTED Final   Acinetobacter baumannii NOT DETECTED NOT DETECTED Final   Enterobacteriaceae species NOT DETECTED NOT DETECTED Final   Enterobacter cloacae complex NOT DETECTED NOT DETECTED Final   Escherichia coli NOT DETECTED NOT DETECTED Final   Klebsiella oxytoca NOT DETECTED NOT DETECTED Final   Klebsiella pneumoniae NOT DETECTED NOT DETECTED Final   Proteus species NOT DETECTED NOT DETECTED Final   Serratia marcescens NOT DETECTED NOT DETECTED Final   Haemophilus influenzae NOT DETECTED NOT DETECTED Final   Neisseria meningitidis NOT DETECTED NOT DETECTED Final   Pseudomonas aeruginosa NOT DETECTED NOT DETECTED Final   Candida albicans NOT DETECTED NOT DETECTED Final   Candida glabrata NOT DETECTED NOT DETECTED Final    Candida krusei NOT DETECTED NOT DETECTED Final   Candida parapsilosis NOT DETECTED NOT DETECTED Final   Candida tropicalis NOT DETECTED NOT DETECTED Final    Comment: Performed at [facility], [place] [address], [place], [place] [address]  Urine Culture     Status: Abnormal  Collection Time: 06/14/18 10:03 PM  Result Value Ref Range Status   Specimen Description URINE, RANDOM  Final   Special Requests   Final    NONE Performed at [facility], [place] [address], [place], [place] [address]    Culture >=100,000 COLONIES/mL ESCHERICHIA COLI (A)  Final   Report Status 06/17/2018 FINAL  Final   Organism ID, Bacteria ESCHERICHIA COLI (A)  Final      Susceptibility   Escherichia coli - MIC*    AMPICILLIN >=32 RESISTANT Resistant     CEFAZOLIN 16 SENSITIVE Sensitive     CEFTRIAXONE <=1 SENSITIVE Sensitive     CIPROFLOXACIN >=4 RESISTANT Resistant     GENTAMICIN <=1 SENSITIVE Sensitive     IMIPENEM <=0.25 SENSITIVE Sensitive     NITROFURANTOIN <=16 SENSITIVE Sensitive     TRIMETH/SULFA >=320 RESISTANT Resistant     AMPICILLIN/SULBACTAM >=32 RESISTANT Resistant     PIP/TAZO >=128 RESISTANT Resistant     Extended ESBL NEGATIVE Sensitive     * >=100,000 COLONIES/mL ESCHERICHIA COLI  Culture, blood (routine x 2)     Status: None (Preliminary result)   Collection Time: 06/16/18  1:52 PM  Result Value Ref Range Status   Specimen Description BLOOD RIGHT ARM  Final   Special Requests   Final    BOTTLES DRAWN AEROBIC AND ANAEROBIC Blood Culture adequate volume   Culture   Final    NO GROWTH < 24 HOURS Performed at [facility], [address], [place], [place] [address]    Report Status PENDING  Incomplete  Culture, blood (routine x 2)     Status: None (Preliminary result)   Collection Time: 06/16/18  1:55 PM  Result Value Ref Range Status   Specimen Description BLOOD LEFT HAND  Final   Special Requests   Final    BOTTLES DRAWN AEROBIC ONLY Blood Culture results may not be  optimal due to an inadequate volume of blood received in culture bottles   Culture   Final    NO GROWTH < 24 HOURS Performed at [facility], [place] [address], [place], [place] [address]    Report Status PENDING  Incomplete     Terri Piedra, [place] for [place] Pager  06/17/2018  11:03 AM

## 2018-06-17 NOTE — Progress Notes (Signed)
[facility] Progress Note  Patient: Kelly [name] JSH:[id number]   PCP: [facility] DOB: 12/23/1985   DOA: 06/14/2018   DOS: 06/17/2018   Date of Service: the patient was seen and examined on 06/17/2018  Brief hospital course: Pt. 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  Patient is [redacted] weeks pregnant Currently further plan is current antibiotic and supportive measures.  Subjective: Denies any acute complaint.  No nausea no vomiting.  No fever no chills.  No diarrhea.  Continues to have cough.  Lost her IV access yesterday.  Telemetry: Sinus tachycardia  Assessment and Plan: 1.  Sepsis secondary to multifocal pneumonia. Streptococcal bacteremia.  Along with enterococcal bacteremia Influenza B infection IV drug abuse. 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 History of epidural abscess. Presents with cough malaise. Tachycardia with hypotension as well as leukocytosis. Elevated lactic acid. Chest x-ray was performed which shows multifocal pneumonia. Blood culture positive for streptococcal and enterococcus. Repeat cultures performed on 06/16/2018. Patient was initially started on IV vancomycin Rocephin and azithromycin transition her to IV Rocephin. This is improving with IV antibiotics.  Due to presence of enterococcus IV ampicillin was added to the regimen. Echocardiogram shows worsening vegetation 10 mm x 20 mm which was 8 mm x 9 mm last time. Systolic function normal 6 to 65%. 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 MRI of the lumbar spine was negative for any acute  abnormality at this time. ID is consulted. Cardiothoracic surgery consulted, currently not a surgical candidate. Ob recommend continue current management Tamiflu added to the regimen after discussing with the pharmacy for its safety during pregnancy.  2.  IV heroin abuse. History of polysubstance abuse. Patient reports that she was on chronic methadone therapy and she stopped going to the methadone clinic in December 2019. She reports that she was 100 mg of methadone. she used to go to [facility] [place] [employer] Discussed with [facility] clinic person, last methadone dose was 70 mg on April 06, 2018.  No longer a patient there. Patient reports that she has been using 9mlligrams of IV heroin. She is requesting to go to rehabilitation program on discharge. I have discussed with OB/GYN attending on call. They feel that giving last documented methadone dose here in the hospital should not harm the fetus nor the patient and would recommend to use that dose. Knowing that physiologically there will be demand for frequent dosing I will divide her 70 mg methadone dose to 40 mg in the morning and 30 mg in the afternoon. Monitor QTC while on methadone.  Currently stable. Continue Phenergan for nausea and Zofran for refractory nausea. Benadryl for itching as well as other withdrawal symptoms. Patient reports that she has allergic reaction to Suboxone although on further questioning she mentions that she started having withdrawal symptoms while she was on Suboxone therapy and never had any rash or throat swelling that required hospitalization. Patient was clearly informed that there is no way that we can provide methadone on discharge prescription and therefore she has to contact her methadone clinic prior to discharge,  Discussed the case with the social worker, they provided the patient information  regarding [facility] substance abuse treatment program for mothers. When close to discharge, we will  involve case management from [facility] to see if they have any more assistance for the patient.  3.  20 weeks pregnancy. by ultrasound she is [redacted] weeks pregnant. Daily monitoring by OB RN with fetal Doppler monitoring. For any OB related complaints contact OB/GYN attending on call.  4.  Elevated LFT. Does not appear to be having like helLp syndrome. No abdominal pain right now. this is all appears to be all in response to severe infection.  Now trending down.  5.  Physiological anemia of pregnancy. Dilutional worsening. Hemoglobin dropping to 6.6.  No active bleeding reported anywhere. We will transfuse 1 PRBC based on my discussion with OB attending Likely the acute drop is secondary to dilution rather than acute bleeding.  Diet: Regular diet DVT Prophylaxis: subcutaneous Heparin  Advance goals of care discussion: full code  Family Communication: no family was present at bedside, at the time of interview.   Disposition:  Discharge to be determined.  Will likely need prolonged antibiotic course which needs to be completed here in the hospital.  Consultants: OB/GYN.,  ID, left call for CT surgery. Procedures:  Echocardiogram 06/16/2018. Tricuspid regurgitation severe by color flow Doppler.  There is a large mobile tricuspid valve vegetation on the anterior leaflet.  The TV vegetation measures 10 mm x 20 mm. The left ventricle has normal systolic function of 17-00%.  Scheduled Meds: . dextromethorphan  30 mg Oral Once  . enoxaparin (LOVENOX) injection  40 mg Subcutaneous Daily  . methadone  30 mg Oral QHS  . methadone  40 mg Oral Daily  . oseltamivir  75 mg Oral BID  . prenatal multivitamin  1 tablet Oral Q1200  . sodium chloride flush  3 mL Intravenous Q12H   Continuous Infusions: . ampicillin (OMNIPEN) IV 2 g (06/17/18 1550)  . cefTRIAXone (ROCEPHIN)  IV     PRN Meds: acetaminophen **OR** acetaminophen, benzonatate, diphenhydrAMINE, ondansetron (ZOFRAN) IV,  promethazine Antibiotics: Anti-infectives (From admission, onward)   Start     Dose/Rate Route Frequency Ordered Stop   06/17/18 2200  cefTRIAXone (ROCEPHIN) 2 g in sodium chloride 0.9 % 100 mL IVPB     2 g 200 mL/hr over 30 Minutes Intravenous Every 12 hours 06/17/18 1046     06/17/18 1200  ampicillin (OMNIPEN) 2 g in sodium chloride 0.9 % 100 mL IVPB     2 g 300 mL/hr over 20 Minutes Intravenous Every 4 hours 06/17/18 1045     06/16/18 1300  oseltamivir (TAMIFLU) capsule 75 mg     75 mg Oral 2 times daily 06/16/18 1231 06/21/18 0959   06/15/18 0700  vancomycin (VANCOCIN) IVPB 1000 mg/200 mL premix  Status:  Discontinued     1,000 mg 200 mL/hr over 60 Minutes Intravenous Every 8 hours 06/14/18 2216 06/15/18 0711   06/15/18 0500  cefTRIAXone (ROCEPHIN) 2 g in sodium chloride 0.9 % 100 mL IVPB  Status:  Discontinued     2 g 200 mL/hr over 30 Minutes Intravenous Every 24 hours 06/15/18 0445 06/17/18 1046   06/14/18 2200  azithromycin (ZITHROMAX) 500 mg in sodium chloride 0.9 % 250 mL IVPB  Status:  Discontinued     500 mg 250 mL/hr over 60 Minutes Intravenous Every 24 hours 06/14/18 2134 06/15/18 0711   06/14/18 2200  cefTRIAXone (ROCEPHIN) 1 g in sodium chloride 0.9 % 100 mL IVPB  Status:  Discontinued  1 g 200 mL/hr over 30 Minutes Intravenous Every 24 hours 06/14/18 2134 06/15/18 0445   06/14/18 2200  vancomycin (VANCOCIN) 1,500 mg in sodium chloride 0.9 % 500 mL IVPB     1,500 mg 250 mL/hr over 120 Minutes Intravenous  Once 06/14/18 2134 06/15/18 0235       Objective: Physical Exam: Vitals:   06/17/18 1222 06/17/18 1233 06/17/18 1252 06/17/18 1545  BP: 108/77  106/84 (!) 136/91  Pulse: 74  85 69  Resp: (!) 21  20 18   Temp: 97.7 F (36.5 C) 98.4 F (36.9 C) 97.7 F (36.5 C) 97.7 F (36.5 C)  TempSrc: Oral Oral Oral Oral  SpO2: 92%  98% 97%  Weight:      Height:        Intake/Output Summary (Last 24 hours) at 06/17/2018 1709 Last data filed at 06/17/2018 1700 Gross  per 24 hour  Intake 3810.17 ml  Output 0 ml  Net 3810.17 ml   Filed Weights   06/14/18 1421 06/15/18 0015 06/16/18 0533  Weight: 77.1 kg 79.7 kg 80 kg   General: Alert, Awake and Oriented to Time, Place and Person. Appear in MODERATE distress, affect irritable Eyes: PERRL, Conjunctiva normal ENT: Oral Mucosa clear moist. Neck: difficult to assess  JVD, no Abnormal Mass Or lumps Cardiovascular: S1 and S2 Present, no Murmur, Peripheral Pulses Present Respiratory: increased respiratory effort, Bilateral Air entry equal and Decreased, no use of accessory muscle, bilateral  Crackles, OCCASIONAL wheezes Abdomen: Bowel Sound present, Soft and no tenderness, no hernia Skin: no redness, no Rash, no induration Extremities: bilateral  Pedal edema, no calf tenderness Neurologic: Grossly no focal neuro deficit. Bilaterally Equal motor strength  Data Reviewed: CBC: Recent Labs  Lab 06/14/18 1429 06/15/18 0042 06/16/18 0935 06/17/18 0721  WBC 15.9* 12.6* 7.5 7.2  NEUTROABS 13.7* 10.6* 4.9 4.7  HGB 10.2* 8.0* 7.4* 6.6*  HCT 32.9* 25.3* 23.9* 21.6*  MCV 85.7 84.3 87.5 87.4  PLT 415* 367 420* 237   Basic Metabolic Panel: Recent Labs  Lab 06/14/18 1429 06/15/18 0042 06/16/18 0935 06/17/18 0721  NA 127* 131* 140 138  K 4.3 3.5 3.9 3.7  CL 91* 97* 107 106  CO2 21* 22 24 21*  GLUCOSE 128* 123* 84 89  BUN 6 5* 8 6  CREATININE 0.62 0.60 0.58 0.50  CALCIUM 8.9 8.2* 8.1* 8.1*  MG  --   --  1.8  --     Liver Function Tests: Recent Labs  Lab 06/14/18 1429 06/15/18 0042 06/16/18 0935 06/17/18 0721  AST 269* 231* 113* 34  ALT 21 19 16 11   ALKPHOS 202* 161* 147* 174*  BILITOT 0.6 0.6 0.1* 0.3  PROT 8.2* 6.6 6.0* 5.9*  ALBUMIN 2.3* 1.7* 1.5* 1.5*   No results for input(s): LIPASE, AMYLASE in the last 168 hours. No results for input(s): AMMONIA in the last 168 hours. Coagulation Profile: Recent Labs  Lab 06/14/18 1429  INR 0.99   Cardiac Enzymes: No results for input(s):  CKTOTAL, CKMB, CKMBINDEX, TROPONINI in the last 168 hours. BNP (last 3 results) No results for input(s): PROBNP in the last 8760 hours. CBG: Recent Labs  Lab 06/17/18 0807 06/17/18 0849  GLUCAP 66* 96   Studies: No results found.   Time spent: 35 minutes   Author: Berle Mull, MD [facility] 06/17/2018 5:09 PM  Between 7PM-7AM, please contact night-coverage at [email or web address]

## 2018-06-17 NOTE — Progress Notes (Signed)
44 Dr. Ilda Basset of [facility] updated on patient and on medical plan of care.  EDC adjusted with his consent and is based on [place] dating in November.

## 2018-06-17 NOTE — Progress Notes (Signed)
Here to perform doppler FHT's for this 33yo G6P2 @ 20.[redacted] wks GA in with flu, pneumonia, UTI and endocarditis. The pt has an extensive history including IV drug use, endocarditis and cesarean section. She denies vaginal bleeding, LOF or UC's.

## 2018-06-17 NOTE — Progress Notes (Signed)
Infectious Diseases Pharmacy Note  Noted blood cultures now growing enterococcus faecalis in 2/2. Spoke with Terri Piedra, NP and added ampicillin for endocarditis coverage and increased ceftriaxone to 2 gm Q 12 hours.   Jimmy Footman, PharmD, BCPS, [place] [facility] Clinical Pharmacist Phone: [phone]

## 2018-06-18 DIAGNOSIS — Z1629 Resistance to other single specified antibiotic: Secondary | ICD-10-CM

## 2018-06-18 DIAGNOSIS — B954 Other streptococcus as the cause of diseases classified elsewhere: Secondary | ICD-10-CM

## 2018-06-18 DIAGNOSIS — Z1611 Resistance to penicillins: Secondary | ICD-10-CM

## 2018-06-18 LAB — COMPREHENSIVE METABOLIC PANEL
ALT: 10 U/L (ref 0–44)
AST: 18 U/L (ref 15–41)
Albumin: 1.6 g/dL — ABNORMAL LOW (ref 3.5–5.0)
Alkaline Phosphatase: 182 U/L — ABNORMAL HIGH (ref 38–126)
Anion gap: 8 (ref 5–15)
BILIRUBIN TOTAL: 0.8 mg/dL (ref 0.3–1.2)
BUN: 5 mg/dL — ABNORMAL LOW (ref 6–20)
CO2: 22 mmol/L (ref 22–32)
Calcium: 8 mg/dL — ABNORMAL LOW (ref 8.9–10.3)
Chloride: 107 mmol/L (ref 98–111)
Creatinine, Ser: 0.56 mg/dL (ref 0.44–1.00)
GFR calc Af Amer: >60 mL/min (ref >60)
GFR calc non Af Amer: >60 mL/min (ref >60)
Glucose, Bld: 99 mg/dL (ref 70–99)
Potassium: 3.7 mmol/L (ref 3.5–5.1)
Sodium: 137 mmol/L (ref 135–145)
TOTAL PROTEIN: 6 g/dL — AB (ref 6.5–8.1)

## 2018-06-18 LAB — CBC WITH DIFFERENTIAL/PLATELET
ABS IMMATURE GRANULOCYTES: 0.12 10*3/uL — AB (ref 0.00–0.07)
Basophils Absolute: 0 10*3/uL (ref 0.0–0.1)
Basophils Relative: 0 %
Eosinophils Absolute: 0 10*3/uL (ref 0.0–0.5)
Eosinophils Relative: 0 %
HCT: 23.6 % — ABNORMAL LOW (ref 36.0–46.0)
Hemoglobin: 7.5 g/dL — ABNORMAL LOW (ref 12.0–15.0)
Immature Granulocytes: 1 %
LYMPHS ABS: 1.5 10*3/uL (ref 0.7–4.0)
Lymphocytes Relative: 15 %
MCH: 27.5 pg (ref 26.0–34.0)
MCHC: 31.8 g/dL (ref 30.0–36.0)
MCV: 86.4 fL (ref 80.0–100.0)
Monocytes Absolute: 0.5 10*3/uL (ref 0.1–1.0)
Monocytes Relative: 5 %
Neutro Abs: 7.3 10*3/uL (ref 1.7–7.7)
Neutrophils Relative %: 79 %
Platelets: 404 10*3/uL — ABNORMAL HIGH (ref 150–400)
RBC: 2.73 MIL/uL — ABNORMAL LOW (ref 3.87–5.11)
RDW: 15.5 % (ref 11.5–15.5)
WBC: 9.4 10*3/uL (ref 4.0–10.5)
nRBC: 0 % (ref 0.0–0.2)

## 2018-06-18 LAB — CULTURE, BLOOD (ROUTINE X 2)

## 2018-06-18 LAB — GLUCOSE, CAPILLARY: Glucose-Capillary: 81 mg/dL (ref 70–99)

## 2018-06-18 MED ORDER — ENSURE ENLIVE PO LIQD
237.0000 mL | Freq: Two times a day (BID) | ORAL | Status: DC
  Administered 2018-06-18 – 2018-08-04 (×74): 237 mL via ORAL

## 2018-06-18 MED ORDER — VANCOMYCIN HCL IN DEXTROSE 1-5 GM/200ML-% IV SOLN
1000.0000 mg | Freq: Three times a day (TID) | INTRAVENOUS | Status: DC
  Administered 2018-06-18 – 2018-06-19 (×4): 1000 mg via INTRAVENOUS
  Filled 2018-06-18 (×5): qty 200

## 2018-06-18 MED ORDER — METHADONE HCL 10 MG PO TABS
40.0000 mg | ORAL_TABLET | Freq: Two times a day (BID) | ORAL | Status: DC
  Administered 2018-06-18 – 2018-06-23 (×11): 40 mg via ORAL
  Filled 2018-06-18 (×12): qty 4

## 2018-06-18 MED ORDER — VANCOMYCIN HCL 10 G IV SOLR
1750.0000 mg | Freq: Once | INTRAVENOUS | Status: AC
  Administered 2018-06-18: 1750 mg via INTRAVENOUS
  Filled 2018-06-18: qty 1750

## 2018-06-18 NOTE — Progress Notes (Signed)
Pharmacy Antibiotic Note  Kelly [name] is a 34 y.o. female admitted on 06/14/2018 with strep and enterococcal endocarditis.  Pharmacy has been consulted for vancomycin dosing. Patient has been on ampicillin and ceftriaxone for the enterococcus isolated in her bloodstream but unfortunately her strep sanguinis species has come back as resistant to ceftriaxone and penicillin, so we will add Vancomycin today. WBC WNL and patient is afebrile. Renal function is stable. 2/3 blood cultures are no growth to date. Patient did receive 1 dose of Vanc on 2/2.   Plan: Vancomycin 1750 mg X 1 Then 1000 mg every 8 hours SCr- 0.8 and Vd-0.9 for pregnancy Estimated AUC 471 Monitor renal function and collect peak and trough at steady state  Height: 5' 7"  (170.2 cm) Weight: 183 lb 3.2 oz (83.1 kg) IBW/kg (Calculated) : 61.6  Temp (24hrs), Avg:98 F (36.7 C), Min:97.4 F (36.3 C), Max:98.9 F (37.2 C)  Recent Labs  Lab 06/14/18 1429 06/14/18 2201 06/15/18 0042 06/16/18 0935 06/17/18 0721 06/17/18 1657 06/18/18 0603  WBC 15.9*  --  12.6* 7.5 7.2 8.4 9.4  CREATININE 0.62  --  0.60 0.58 0.50  --  0.56  LATICACIDVEN 2.1* 0.9 1.5  --   --   --   --     Estimated Creatinine Clearance: 110.8 mL/min (by C-G formula based on SCr of 0.56 mg/dL).    Allergies  Allergen Reactions  . Hydrocodone Itching  . Morphine And Related Nausea And Vomiting    Antimicrobials this admission: Vancomcyin 2/1 >> 2/2, 2/5>>> Azithromycin 2/1 >> 2/2 Ceftriaxone 2/1>> Ampicillin 2/4>>   Microbiology results: 2/1 BCx: enterococcus faecalis (S-amp) , strep sanguinis in 1/2 (R-CTX, PCN S- Vanc) and strep mitis in 1/2  2/1 UCx: E coli  Thank you for allowing pharmacy to be a part of this patient's care.  Jimmy Footman, PharmD, BCPS, BCIDP Infectious Diseases Clinical Pharmacist Phone: [phone] 06/18/2018 10:46 AM

## 2018-06-18 NOTE — Progress Notes (Signed)
[facility] for [facility]  Date of Admission:  06/14/2018     Total days of antibiotics 5         ASSESSMENT/PLAN  Ms. Obar has Enterococcus faecalis and penicillin/ceftriaxone resistant Streptococcus sanguinis bacteremia with previous endocarditis who has relapsed with heroin usage and is currently [redacted] weeks pregnant and complicated course with Influenza B. CVTS evaluated and not currently surgical candidate while pregnant. Will add vancomycin to ampicillin and ceftriaxone for treatment of bacteremia. Repeat cultures are currently without growth to date. Opioid use disorder continues with methadone. She is going to require at least 6 weeks of IV therapy and will likely need to remain in the hospital once again for this. This was explained in detail to Ms. Kelly [name] and she is in agreement with the plan.  1. Continue ampicillin/ceftriaxone for Enterococcus faecalis bacteremia/endocarditis. 2. Start vancomycin for resistant Streptococcus sanguinis  Bacteremia. 3. Monitor cultures and fever curve. 4. Opioid use disorder per primary team. 5. PICC placement possible in the next 24 hours pending culture results.   Principal Problem:   Bacteremia due to Enterococcus Active Problems:   IV drug abuse complicating pregnancy ([place])   Severe tricuspid regurgitation by prior echocardiogram   Influenza B   Opioid use disorder, severe, dependence (HCC)   Sepsis due to pneumonia [facility])   Multifocal pneumonia   Hyponatremia   Elevated transaminase level   Degenerative arthritis of lumbar spine   History of bacterial endocarditis   . dextromethorphan  30 mg Oral Once  . enoxaparin (LOVENOX) injection  40 mg Subcutaneous Daily  . feeding supplement (ENSURE ENLIVE)  237 mL Oral BID BM  . methadone  40 mg Oral Q12H  . oseltamivir  75 mg Oral BID  . prenatal multivitamin  1 tablet Oral Q1200  . sodium chloride flush  3 mL Intravenous Q12H    SUBJECTIVE:  Afebrile overnight  with no acute events. Blood cultures now positive for Enterococcus faecalis and streptococcus sanguinis (resistant to ceftraixone and penicillin).   Continues to feel bad with no significant improvements.   Allergies  Allergen Reactions  . Hydrocodone Itching  . Morphine And Related Nausea And Vomiting     Review of Systems: Review of Systems  Constitutional: Positive for malaise/fatigue. Negative for chills and fever.  Respiratory: Positive for cough and shortness of breath. Negative for wheezing.   Cardiovascular: Negative for chest pain.  Gastrointestinal: Negative for abdominal pain, constipation, diarrhea, nausea and vomiting.  Skin: Negative for rash.    OBJECTIVE: Vitals:   06/17/18 2323 06/18/18 0254 06/18/18 0339 06/18/18 0818  BP: (!) 131/94 127/89  106/84  Pulse: 70 74    Resp: (!) 21 (!) 24  (!) 22  Temp: 97.9 F (36.6 C) 98.9 F (37.2 C)  98.3 F (36.8 C)  TempSrc:    Axillary  SpO2: 97% 96%  97%  Weight:   83.1 kg   Height:       Body mass index is 28.69 kg/m.  Physical Exam Constitutional:      General: She is not in acute distress.    Appearance: She is well-developed. She is ill-appearing.  Cardiovascular:     Rate and Rhythm: Normal rate and regular rhythm.     Heart sounds: Normal heart sounds.  Pulmonary:     Effort: Pulmonary effort is normal.     Breath sounds: Rhonchi present. No decreased breath sounds, wheezing or rales.  Skin:    General: Skin is warm and dry.  Neurological:     Mental Status: She is alert and oriented to person, place, and time.  Psychiatric:        Behavior: Behavior normal. Behavior is cooperative.        Thought Content: Thought content normal.        Judgment: Judgment normal.     Lab Results Lab Results  Component Value Date   WBC 9.4 06/18/2018   HGB 7.5 (L) 06/18/2018   HCT 23.6 (L) 06/18/2018   MCV 86.4 06/18/2018   PLT 404 (H) 06/18/2018    Lab Results  Component Value Date   CREATININE 0.56  06/18/2018   BUN 5 (L) 06/18/2018   NA 137 06/18/2018   K 3.7 06/18/2018   CL 107 06/18/2018   CO2 22 06/18/2018    Lab Results  Component Value Date   ALT 10 06/18/2018   AST 18 06/18/2018   ALKPHOS 182 (H) 06/18/2018   BILITOT 0.8 06/18/2018     Microbiology: Recent Results (from the past 240 hour(s))  Culture, blood (Routine x 2)     Status: Abnormal   Collection Time: 06/14/18  2:30 PM  Result Value Ref Range Status   Specimen Description BLOOD RIGHT ANTECUBITAL  Final   Special Requests   Final    BOTTLES DRAWN AEROBIC AND ANAEROBIC Blood Culture adequate volume   Culture  Setup Time   Final    GRAM POSITIVE COCCI IN CHAINS IN BOTH AEROBIC AND ANAEROBIC BOTTLES CRITICAL VALUE NOTED.  VALUE IS CONSISTENT WITH PREVIOUSLY REPORTED AND CALLED VALUE.    Culture (A)  Final    VIRIDANS STREPTOCOCCUS ENTEROCOCCUS FAECALIS SUSCEPTIBILITIES PERFORMED ON PREVIOUS CULTURE WITHIN THE LAST 5 DAYS. Performed at [facility], [place] [address], [place], [place] [address]    Report Status 06/18/2018 FINAL  Final  Culture, blood (Routine x 2)     Status: Abnormal   Collection Time: 06/14/18  2:35 PM  Result Value Ref Range Status   Specimen Description BLOOD RIGHT ANTECUBITAL  Final   Special Requests   Final    BOTTLES DRAWN AEROBIC AND ANAEROBIC Blood Culture results may not be optimal due to an excessive volume of blood received in culture bottles   Culture  Setup Time   Final    GRAM POSITIVE COCCI IN CHAINS IN BOTH AEROBIC AND ANAEROBIC BOTTLES CRITICAL RESULT CALLED TO, READ BACK BY AND VERIFIED WITH: Serita Grammes [id number] 06/15/2018 Mena Goes Performed at [facility], [place] [address] [place], [place] [address]    Culture STREPTOCOCCUS SANGUINIS ENTEROCOCCUS FAECALIS  (A)  Final   Report Status 06/18/2018 FINAL  Final   Organism ID, Bacteria STREPTOCOCCUS SANGUINIS  Final   Organism ID, Bacteria ENTEROCOCCUS FAECALIS  Final      Susceptibility   Streptococcus  sanguinis - MIC*    PENICILLIN 4 RESISTANT Resistant     CEFTRIAXONE >=8 RESISTANT Resistant     ERYTHROMYCIN 2 RESISTANT Resistant     LEVOFLOXACIN 1 SENSITIVE Sensitive     VANCOMYCIN 1 SENSITIVE Sensitive     * STREPTOCOCCUS SANGUINIS   Enterococcus faecalis - MIC*    AMPICILLIN <=2 SENSITIVE Sensitive     VANCOMYCIN 1 SENSITIVE Sensitive     GENTAMICIN SYNERGY SENSITIVE Sensitive     * ENTEROCOCCUS FAECALIS  Blood Culture ID Panel (Reflexed)     Status: Abnormal   Collection Time: 06/14/18  2:35 PM  Result Value Ref Range Status   Enterococcus species NOT DETECTED NOT DETECTED  Final   Listeria monocytogenes NOT DETECTED NOT DETECTED Final   Staphylococcus species NOT DETECTED NOT DETECTED Final   Staphylococcus aureus (BCID) NOT DETECTED NOT DETECTED Final   Streptococcus species DETECTED (A) NOT DETECTED Final    Comment: Not Enterococcus species, Streptococcus agalactiae, Streptococcus pyogenes, or Streptococcus pneumoniae. CRITICAL RESULT CALLED TO, READ BACK BY AND VERIFIED WITH: J. LEDFORD,PHARMD [id number] 06/15/2018 T. TYSOR    Streptococcus agalactiae NOT DETECTED NOT DETECTED Final   Streptococcus pneumoniae NOT DETECTED NOT DETECTED Final   Streptococcus pyogenes NOT DETECTED NOT DETECTED Final   Acinetobacter baumannii NOT DETECTED NOT DETECTED Final   Enterobacteriaceae species NOT DETECTED NOT DETECTED Final   Enterobacter cloacae complex NOT DETECTED NOT DETECTED Final   Escherichia coli NOT DETECTED NOT DETECTED Final   Klebsiella oxytoca NOT DETECTED NOT DETECTED Final   Klebsiella pneumoniae NOT DETECTED NOT DETECTED Final   Proteus species NOT DETECTED NOT DETECTED Final   Serratia marcescens NOT DETECTED NOT DETECTED Final   Haemophilus influenzae NOT DETECTED NOT DETECTED Final   Neisseria meningitidis NOT DETECTED NOT DETECTED Final   Pseudomonas aeruginosa NOT DETECTED NOT DETECTED Final   Candida albicans NOT DETECTED NOT DETECTED Final   Candida glabrata  NOT DETECTED NOT DETECTED Final   Candida krusei NOT DETECTED NOT DETECTED Final   Candida parapsilosis NOT DETECTED NOT DETECTED Final   Candida tropicalis NOT DETECTED NOT DETECTED Final    Comment: Performed at [facility], [place] [address], [place], [place] [address]  Urine Culture     Status: Abnormal   Collection Time: 06/14/18 10:03 PM  Result Value Ref Range Status   Specimen Description URINE, RANDOM  Final   Special Requests   Final    NONE Performed at [facility], [place] [address] [place], [place] [address]    Culture >=100,000 COLONIES/mL ESCHERICHIA COLI (A)  Final   Report Status 06/17/2018 FINAL  Final   Organism ID, Bacteria ESCHERICHIA COLI (A)  Final      Susceptibility   Escherichia coli - MIC*    AMPICILLIN >=32 RESISTANT Resistant     CEFAZOLIN 16 SENSITIVE Sensitive     CEFTRIAXONE <=1 SENSITIVE Sensitive     CIPROFLOXACIN >=4 RESISTANT Resistant     GENTAMICIN <=1 SENSITIVE Sensitive     IMIPENEM <=0.25 SENSITIVE Sensitive     NITROFURANTOIN <=16 SENSITIVE Sensitive     TRIMETH/SULFA >=320 RESISTANT Resistant     AMPICILLIN/SULBACTAM >=32 RESISTANT Resistant     PIP/TAZO >=128 RESISTANT Resistant     Extended ESBL NEGATIVE Sensitive     * >=100,000 COLONIES/mL ESCHERICHIA COLI  Culture, blood (routine x 2)     Status: None (Preliminary result)   Collection Time: 06/16/18  1:52 PM  Result Value Ref Range Status   Specimen Description BLOOD RIGHT ARM  Final   Special Requests   Final    BOTTLES DRAWN AEROBIC AND ANAEROBIC Blood Culture adequate volume   Culture   Final    NO GROWTH 1 DAY Performed at [facility], [address], [place], [place] [address]    Report Status PENDING  Incomplete  Culture, blood (routine x 2)     Status: None (Preliminary result)   Collection Time: 06/16/18  1:55 PM  Result Value Ref Range Status   Specimen Description BLOOD LEFT HAND  Final   Special Requests   Final    BOTTLES DRAWN AEROBIC ONLY Blood  Culture results may not be optimal due  to an inadequate volume of blood received in culture bottles   Culture   Final    NO GROWTH 1 DAY Performed at [facility], [place] [address], [place], [place] [address]    Report Status PENDING  Incomplete     Terri Piedra, NP [facility] [phone] Pager  06/18/2018  10:29 AM

## 2018-06-18 NOTE — Progress Notes (Addendum)
[facility] Progress Note  Patient: Kelly [name] RVU:[id number]   PCP: [facility] DOB: 1985/07/15   DOA: 06/14/2018   DOS: 06/18/2018   Date of Service: the patient was seen and examined on 06/18/2018  Brief hospital course: Pt. 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  Patient is [redacted] weeks pregnant Currently further plan is current antibiotic and supportive measures.  Subjective: continue to complain of generalized body ache and nausea. No fever or chills still has cough and shortnes of breath   Telemetry: Sinus tachycardia  Assessment and Plan: 1.  Sepsis secondary to multifocal pneumonia. Streptococcal bacteremia.  Along with enterococcal bacteremia Influenza B infection IV drug abuse. 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 History of epidural abscess. Presents with cough malaise. Tachycardia with hypotension as well as leukocytosis. Elevated lactic acid. Chest x-ray was performed which shows multifocal pneumonia. Blood culture positive for streptococcal and enterococcus. Repeat cultures performed on 06/16/2018. Patient was initially started on IV vancomycin Rocephin and azithromycin transition her to IV Rocephin. ID consulted  Due to presence of enterococcus IV ampicillin was added to the regimen. Sensitivities are back and IV vancomycin was also added   Echocardiogram shows worsening vegetation 10 mm x 20 mm which was 8 mm x 9 mm last time. Systolic function normal 6 to 65%. 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 MRI of the lumbar spine was negative for any  acute abnormality at this time. Cardiothoracic surgery consulted, currently not a surgical candidate. Ob recommend continue current management Tamiflu added to the regimen after discussing with the pharmacy for its safety during pregnancy.  2.  IV heroin abuse. History of polysubstance abuse. Patient reports that she was on chronic methadone therapy and she stopped going to the methadone clinic in December 2019. She reports that she was 100 mg of methadone. she used to go to [facility] in [place] Discussed with [facility] clinic person, last methadone dose was 70 mg on April 06, 2018.  No longer a patient there. Patient reports that she has been using 76mlligrams of IV heroin. She is requesting to go to rehabilitation program on discharge. I have discussed with OB/GYN attending on call. Knowing that physiologically there will be demand for frequent dosing I will increase her last documented clinic dose of 70 mg to 80 mg and divide her 80 mg methadone dose to 40 mg BID. No further dose increase.  Monitor QTC while on methadone.  Currently stable.  Continue Phenergan for nausea and Zofran for refractory nausea. Benadryl for itching as well as other withdrawal symptoms. Patient reports that she has allergic reaction to Suboxone although on further questioning she mentions that she started having withdrawal symptoms while she was on Suboxone therapy and never had any rash or throat swelling that required hospitalization. Patient was clearly informed that there is no way that we can provide methadone on discharge prescription and therefore she has to contact her methadone clinic prior to discharge,  Discussed the case with the social worker, they provided the patient information regarding [facility] abuse treatment program for mothers. When close to discharge, we will [facility] to see if they have any more assistance for the patient.  3.  20  weeks pregnancy. by ultrasound she is [redacted] weeks pregnant. Daily monitoring by OB RN with fetal Doppler monitoring. For any OB related complaints contact OB/GYN attending on call. Ob will round on the pt 06/19/18   4.  Elevated LFT. Does not appear to be having like helLp syndrome. No abdominal pain right now. this is all appears to be all in response to severe infection.  Now trending down.  5.  Physiological anemia of pregnancy. Dilutional worsening. Hemoglobin dropping to 6.6.  No active bleeding reported anywhere. S/P transfuse 1 PRBC based on my discussion with OB attending Likely the acute drop is secondary to dilution rather than acute bleeding. Now stable  Diet: Regular diet DVT Prophylaxis: subcutaneous Heparin  Advance goals of care discussion: full code  Family Communication: no family was present at bedside, at the time of interview.   Disposition:  Discharge to be determined.  Will likely need prolonged antibiotic course which needs to be completed here in the hospital.  Consultants: OB/GYN.,  ID, left call for CT surgery. Procedures:  Echocardiogram 06/16/2018. Tricuspid regurgitation severe by color flow Doppler.  There is a large mobile tricuspid valve vegetation on the anterior leaflet.  The TV vegetation measures 10 mm x 20 mm. The left ventricle has normal systolic function of 85-63%.  Scheduled Meds: . dextromethorphan  30 mg Oral Once  . enoxaparin (LOVENOX) injection  40 mg Subcutaneous Daily  . feeding supplement (ENSURE ENLIVE)  237 mL Oral BID BM  . methadone  40 mg Oral Q12H  . oseltamivir  75 mg Oral BID  . prenatal multivitamin  1 tablet Oral Q1200  . sodium chloride flush  3 mL Intravenous Q12H   Continuous Infusions: . ampicillin (OMNIPEN) IV 2 g (06/18/18 1653)  . cefTRIAXone (ROCEPHIN)  IV Stopped (06/18/18 1826)  . vancomycin     PRN Meds: acetaminophen **OR** acetaminophen, benzonatate, diphenhydrAMINE, ondansetron (ZOFRAN) IV,  promethazine Antibiotics: Anti-infectives (From admission, onward)   Start     Dose/Rate Route Frequency Ordered Stop   06/18/18 2000  vancomycin (VANCOCIN) IVPB 1000 mg/200 mL premix     1,000 mg 200 mL/hr over 60 Minutes Intravenous Every 8 hours 06/18/18 1054     06/18/18 1200  vancomycin (VANCOCIN) 1,750 mg in sodium chloride 0.9 % 500 mL IVPB     1,750 mg 250 mL/hr over 120 Minutes Intravenous  Once 06/18/18 1054 06/18/18 1607   06/17/18 2200  cefTRIAXone (ROCEPHIN) 2 g in sodium chloride 0.9 % 100 mL IVPB     2 g 200 mL/hr over 30 Minutes Intravenous Every 12 hours 06/17/18 1046     06/17/18 1200  ampicillin (OMNIPEN) 2 g in sodium chloride 0.9 % 100 mL IVPB     2 g 300 mL/hr over 20 Minutes Intravenous Every 4 hours 06/17/18 1045     06/16/18 1300  oseltamivir (TAMIFLU) capsule 75 mg     75 mg Oral 2 times daily 06/16/18 1231 06/21/18 0959   06/15/18 0700  vancomycin (VANCOCIN) IVPB 1000 mg/200 mL premix  Status:  Discontinued     1,000 mg 200 mL/hr over 60 Minutes Intravenous Every 8 hours 06/14/18 2216 06/15/18 0711   06/15/18 0500  cefTRIAXone (ROCEPHIN) 2 g in sodium chloride 0.9 % 100 mL IVPB  Status:  Discontinued     2 g 200 mL/hr over 30 Minutes Intravenous Every 24 hours 06/15/18 0445 06/17/18  1046   06/14/18 2200  azithromycin (ZITHROMAX) 500 mg in sodium chloride 0.9 % 250 mL IVPB  Status:  Discontinued     500 mg 250 mL/hr over 60 Minutes Intravenous Every 24 hours 06/14/18 2134 06/15/18 0711   06/14/18 2200  cefTRIAXone (ROCEPHIN) 1 g in sodium chloride 0.9 % 100 mL IVPB  Status:  Discontinued     1 g 200 mL/hr over 30 Minutes Intravenous Every 24 hours 06/14/18 2134 06/15/18 0445   06/14/18 2200  vancomycin (VANCOCIN) 1,500 mg in sodium chloride 0.9 % 500 mL IVPB     1,500 mg 250 mL/hr over 120 Minutes Intravenous  Once 06/14/18 2134 06/15/18 0235       Objective: Physical Exam: Vitals:   06/18/18 0339 06/18/18 0818 06/18/18 1211 06/18/18 1711  BP:  106/84  130/88 115/89  Pulse:   67 63  Resp:  (!) 22  (!) 22  Temp:  98.3 F (36.8 C) 98.1 F (36.7 C) 97.6 F (36.4 C)  TempSrc:  Axillary Oral Oral  SpO2:  97% 95% 97%  Weight: 83.1 kg     Height:        Intake/Output Summary (Last 24 hours) at 06/18/2018 1845 Last data filed at 06/18/2018 1407 Gross per 24 hour  Intake 700 ml  Output 400 ml  Net 300 ml   Filed Weights   06/15/18 0015 06/16/18 0533 06/18/18 0339  Weight: 79.7 kg 80 kg 83.1 kg   General: Alert, Awake and Oriented to Time, Place and Person. Appear in MODERATE distress, affect irritable Eyes: PERRL, Conjunctiva normal ENT: Oral Mucosa clear moist. Neck: difficult to assess  JVD, no Abnormal Mass Or lumps Cardiovascular: S1 and S2 Present, no Murmur, Peripheral Pulses Present Respiratory: increased respiratory effort, Bilateral Air entry equal and Decreased, no use of accessory muscle, bilateral  Crackles, OCCASIONAL wheezes Abdomen: Bowel Sound present, Soft and no tenderness, no hernia Skin: no redness, no Rash, no induration Extremities: bilateral  Pedal edema, no calf tenderness Neurologic: Grossly no focal neuro deficit. Bilaterally Equal motor strength  Data Reviewed: CBC: Recent Labs  Lab 06/14/18 1429 06/15/18 0042 06/16/18 0935 06/17/18 0721 06/17/18 1657 06/18/18 0603  WBC 15.9* 12.6* 7.5 7.2 8.4 9.4  NEUTROABS 13.7* 10.6* 4.9 4.7  --  7.3  HGB 10.2* 8.0* 7.4* 6.6* 7.9* 7.5*  HCT 32.9* 25.3* 23.9* 21.6* 24.9* 23.6*  MCV 85.7 84.3 87.5 87.4 86.2 86.4  PLT 415* 367 420* 376 369 166*   Basic Metabolic Panel: Recent Labs  Lab 06/14/18 1429 06/15/18 0042 06/16/18 0935 06/17/18 0721 06/18/18 0603  NA 127* 131* 140 138 137  K 4.3 3.5 3.9 3.7 3.7  CL 91* 97* 107 106 107  CO2 21* 22 24 21* 22  GLUCOSE 128* 123* 84 89 99  BUN 6 5* 8 6 5*  CREATININE 0.62 0.60 0.58 0.50 0.56  CALCIUM 8.9 8.2* 8.1* 8.1* 8.0*  MG  --   --  1.8  --   --     Liver Function Tests: Recent Labs  Lab 06/14/18 1429  06/15/18 0042 06/16/18 0935 06/17/18 0721 06/18/18 0603  AST 269* 231* 113* 34 18  ALT 21 19 16 11 10   ALKPHOS 202* 161* 147* 174* 182*  BILITOT 0.6 0.6 0.1* 0.3 0.8  PROT 8.2* 6.6 6.0* 5.9* 6.0*  ALBUMIN 2.3* 1.7* 1.5* 1.5* 1.6*   No results for input(s): LIPASE, AMYLASE in the last 168 hours. No results for input(s): AMMONIA in the last 168 hours. Coagulation  Profile: Recent Labs  Lab 06/14/18 1429  INR 0.99   Cardiac Enzymes: No results for input(s): CKTOTAL, CKMB, CKMBINDEX, TROPONINI in the last 168 hours. BNP (last 3 results) No results for input(s): PROBNP in the last 8760 hours. CBG: Recent Labs  Lab 06/17/18 0807 06/17/18 0849 06/18/18 0806  GLUCAP 66* 96 81   Studies: No results found.   Time spent: 35 minutes   Author: Berle Mull, MD [facility] 06/18/2018 6:45 PM  Between 7PM-7AM, please contact night-coverage at [email or web address]

## 2018-06-18 NOTE — Progress Notes (Signed)
Here to perform QD doppler FHT's on this 32yo G6P2 @ 20.[redacted] wks GA.  Doppler FHT's 160's. No fever. Denies vaginal bleeding or LOF or current abdominal pain.  Dr. Ilda Basset of [facility] updated.  He will round on patient tomorrow now that she is more alert.

## 2018-06-19 ENCOUNTER — Inpatient Hospital Stay: Payer: Self-pay

## 2018-06-19 DIAGNOSIS — Z3A00 Weeks of gestation of pregnancy not specified: Secondary | ICD-10-CM

## 2018-06-19 DIAGNOSIS — F1120 Opioid dependence, uncomplicated: Secondary | ICD-10-CM

## 2018-06-19 DIAGNOSIS — F1990 Other psychoactive substance use, unspecified, uncomplicated: Secondary | ICD-10-CM

## 2018-06-19 DIAGNOSIS — B950 Streptococcus, group A, as the cause of diseases classified elsewhere: Secondary | ICD-10-CM

## 2018-06-19 DIAGNOSIS — O99320 Drug use complicating pregnancy, unspecified trimester: Secondary | ICD-10-CM

## 2018-06-19 LAB — COMPREHENSIVE METABOLIC PANEL
ALT: 9 U/L (ref 0–44)
AST: 19 U/L (ref 15–41)
Albumin: 1.6 g/dL — ABNORMAL LOW (ref 3.5–5.0)
Alkaline Phosphatase: 171 U/L — ABNORMAL HIGH (ref 38–126)
Anion gap: 10 (ref 5–15)
BUN: 5 mg/dL — ABNORMAL LOW (ref 6–20)
CO2: 22 mmol/L (ref 22–32)
Calcium: 7.9 mg/dL — ABNORMAL LOW (ref 8.9–10.3)
Chloride: 105 mmol/L (ref 98–111)
Creatinine, Ser: 0.57 mg/dL (ref 0.44–1.00)
GFR calc non Af Amer: >60 mL/min (ref >60)
Glucose, Bld: 81 mg/dL (ref 70–99)
Potassium: 3.8 mmol/L (ref 3.5–5.1)
Sodium: 137 mmol/L (ref 135–145)
TOTAL PROTEIN: 5.8 g/dL — AB (ref 6.5–8.1)
Total Bilirubin: 0.7 mg/dL (ref 0.3–1.2)

## 2018-06-19 LAB — CBC WITH DIFFERENTIAL/PLATELET
Abs Immature Granulocytes: 0.17 10*3/uL — ABNORMAL HIGH (ref 0.00–0.07)
Basophils Absolute: 0 10*3/uL (ref 0.0–0.1)
Basophils Relative: 0 %
EOS ABS: 0 10*3/uL (ref 0.0–0.5)
Eosinophils Relative: 0 %
HEMATOCRIT: 23.3 % — AB (ref 36.0–46.0)
Hemoglobin: 7.3 g/dL — ABNORMAL LOW (ref 12.0–15.0)
Immature Granulocytes: 2 %
Lymphocytes Relative: 24 %
Lymphs Abs: 2.1 10*3/uL (ref 0.7–4.0)
MCH: 27.5 pg (ref 26.0–34.0)
MCHC: 31.3 g/dL (ref 30.0–36.0)
MCV: 87.9 fL (ref 80.0–100.0)
Monocytes Absolute: 0.7 10*3/uL (ref 0.1–1.0)
Monocytes Relative: 8 %
Neutro Abs: 5.5 10*3/uL (ref 1.7–7.7)
Neutrophils Relative %: 66 %
Platelets: 352 10*3/uL (ref 150–400)
RBC: 2.65 MIL/uL — ABNORMAL LOW (ref 3.87–5.11)
RDW: 15.7 % — ABNORMAL HIGH (ref 11.5–15.5)
WBC: 8.5 10*3/uL (ref 4.0–10.5)
nRBC: 0 % (ref 0.0–0.2)

## 2018-06-19 LAB — CULTURE, BLOOD (ROUTINE X 2): Special Requests: ADEQUATE

## 2018-06-19 LAB — GLUCOSE, CAPILLARY: Glucose-Capillary: 78 mg/dL (ref 70–99)

## 2018-06-19 LAB — VANCOMYCIN, PEAK: Vancomycin Pk: 19 ug/mL — ABNORMAL LOW (ref 30–40)

## 2018-06-19 LAB — VANCOMYCIN, TROUGH: Vancomycin Tr: 11 ug/mL — ABNORMAL LOW (ref 15–20)

## 2018-06-19 MED ORDER — VANCOMYCIN HCL 10 G IV SOLR
2000.0000 mg | Freq: Two times a day (BID) | INTRAVENOUS | Status: DC
  Administered 2018-06-20 – 2018-06-22 (×5): 2000 mg via INTRAVENOUS
  Filled 2018-06-19 (×7): qty 2000

## 2018-06-19 MED ORDER — SODIUM CHLORIDE 0.9% FLUSH
10.0000 mL | Freq: Two times a day (BID) | INTRAVENOUS | Status: DC
  Administered 2018-06-19 – 2018-06-24 (×10): 10 mL
  Administered 2018-06-25: 20 mL
  Administered 2018-06-25 – 2018-06-27 (×4): 10 mL
  Administered 2018-06-28: 30 mL
  Administered 2018-06-28: 10 mL
  Administered 2018-06-29: 20 mL
  Administered 2018-06-29 – 2018-07-16 (×23): 10 mL
  Administered 2018-07-17: 20 mL
  Administered 2018-07-18 – 2018-08-04 (×27): 10 mL

## 2018-06-19 MED ORDER — SODIUM CHLORIDE 0.9% FLUSH
10.0000 mL | INTRAVENOUS | Status: DC | PRN
  Administered 2018-07-18: 20 mL
  Administered 2018-07-24: 40 mL
  Filled 2018-06-19 (×2): qty 40

## 2018-06-19 NOTE — Progress Notes (Signed)
Peripherally Inserted Central Catheter/Midline Placement  The IV Nurse has discussed with the patient and/or persons authorized to consent for the patient, the purpose of this procedure and the potential benefits and risks involved with this procedure.  The benefits include less needle sticks, lab draws from the catheter, and the patient may be discharged home with the catheter. Risks include, but not limited to, infection, bleeding, blood clot (thrombus formation), and puncture of an artery; nerve damage and irregular heartbeat and possibility to perform a PICC exchange if needed/ordered by physician.  Alternatives to this procedure were also discussed.  Bard Power PICC patient education guide, fact sheet on infection prevention and patient information card has been provided to patient /or left at bedside.  PICC inserted by Valentina Shaggy, RN   PICC/Midline Placement Documentation  PICC Double Lumen 06/19/18 PICC Right Brachial 34 cm 0 cm (Active)  Indication for Insertion or Continuance of Line Prolonged intravenous therapies 06/19/2018  2:27 PM  Exposed Catheter (cm) 0 cm 06/19/2018  2:27 PM  Site Assessment Clean;Dry;Intact 06/19/2018  2:27 PM  Lumen #1 Status Flushed;Saline locked;Blood return noted 06/19/2018  2:27 PM  Lumen #2 Status Flushed;Saline locked;Blood return noted 06/19/2018  2:27 PM  Dressing Type Transparent 06/19/2018  2:27 PM  Dressing Status Clean;Dry;Intact 06/19/2018  2:27 PM  Dressing Intervention New dressing 06/19/2018  2:27 PM  Dressing Change Due 06/26/18 06/19/2018  2:27 PM       Jennine Peddy, Nicolette Bang 06/19/2018, 2:28 PM

## 2018-06-19 NOTE — Progress Notes (Signed)
FHT dopplered at 152bpm.  Pt groggy but no complains at this time.  Dr Ilda Basset updated.

## 2018-06-19 NOTE — Progress Notes (Signed)
Pharmacy Antibiotic Note  Kelly [name] is a 33 y.o. female admitted on 06/14/2018 with strep and enterococcal endocarditis.  Pharmacy has been consulted for vancomycin dosing. Patient has been on ampicillin and ceftriaxone for the enterococcus isolated in her bloodstream but unfortunately her strep sanguinis species has come back as resistant to ceftriaxone and penicillin, so vancomycin was added. A vancomycin Pk/trough was 19/11. Calculated AUC was slightly subtherapeutic at 389  Plan: Change Vancomycin to 2000 mg every 12 hours for expected AUC of 517 Monitor renal function and collect peak and trough at steady state  Height: 5' 7"  (170.2 cm) Weight: 183 lb 3.2 oz (83.1 kg) IBW/kg (Calculated) : 61.6  Temp (24hrs), Avg:98.5 F (36.9 C), Min:98 F (36.7 C), Max:99 F (37.2 C)  Recent Labs  Lab 06/14/18 1429 06/14/18 2201 06/15/18 0042 06/16/18 0935 06/17/18 0721 06/17/18 1657 06/18/18 0603 06/19/18 0229 06/19/18 1630 06/19/18 2130  WBC 15.9*  --  12.6* 7.5 7.2 8.4 9.4 8.5  --   --   CREATININE 0.62  --  0.60 0.58 0.50  --  0.56 0.57  --   --   LATICACIDVEN 2.1* 0.9 1.5  --   --   --   --   --   --   --   VANCOTROUGH  --   --   --   --   --   --   --   --   --  11*  VANCOPEAK  --   --   --   --   --   --   --   --  19*  --     Estimated Creatinine Clearance: 110.8 mL/min (by C-G formula based on SCr of 0.57 mg/dL).    Allergies  Allergen Reactions  . Hydrocodone Itching  . Morphine And Related Nausea And Vomiting    Antimicrobials this admission: Vancomcyin 2/1 >> 2/2, 2/5>>> Azithromycin 2/1 >> 2/2 Ceftriaxone 2/1>> Ampicillin 2/4>>   Microbiology results: 2/1 BCx: enterococcus faecalis (S-amp) , strep sanguinis in 1/2 (R-CTX, PCN S- Vanc) and strep mitis in 1/2  2/1 UCx: E coli  Thank you for allowing pharmacy to be a part of this patient's care.  Albertina Parr, PharmD., BCPS Clinical Pharmacist Clinical phone for 06/19/18 until 10:30pm: [phone] If  after 10:30pm, please refer to [facility] for unit-specific pharmacist

## 2018-06-19 NOTE — Progress Notes (Signed)
[facility] Infect[facility]  Date of Admission:  06/14/2018     Total days of antibiotics 6         ASSESSMENT/PLAN  Kelly [name] has polymicrobial bacteremia with Enterococcus faecalis, Streptococcus sanguinis and Streptococcus viridans with concern for continued endocarditis with relapsing IV drug use complicated with Influenza B and pregnancy of 2 weeks. She remains stable and afebrile despite continued malaise and body aches which is likely a combination of influenza and possible withdrawal symptoms. Renal function stable with vancomycin. Will need PICC line for prolonged therapy and repeat cultures have been clear for 72 hours. Continue with vancomycin, ceftriaxone, ampicillin and Tamiflu.   1. Continue antibiotics and antivirals. 2. Monitor fever curve, WBC count and cultures. 3. Pregnancy management per OB.  4. OK to place PICC. 5. Opioid use disorder per primary team.      Principal Problem:   Bacteremia due to Enterococcus Active Problems:   IV drug abuse complicating pregnancy (HCC)   Severe tricuspid regurgitation by prior echocardiogram   Influenza B   Opioid use disorder, severe, dependence (HCC)   Sepsis due to pneumonia [facility])   Multifocal pneumonia   Hyponatremia   Elevated transaminase level   Degenerative arthritis of lumbar spine   History of bacterial endocarditis   . enoxaparin (LOVENOX) injection  40 mg Subcutaneous Daily  . feeding supplement (ENSURE ENLIVE)  237 mL Oral BID BM  . methadone  40 mg Oral Q12H  . oseltamivir  75 mg Oral BID  . prenatal multivitamin  1 tablet Oral Q1200  . sodium chloride flush  3 mL Intravenous Q12H    SUBJECTIVE:  Afebrile overnight with WBC count of 8.5. No acute events. Repeat blood cultures from 2/3 remain without growth to date.   Continues to feel bad with malaise and body aches. Having chills at present.   Allergies  Allergen Reactions  . Hydrocodone Itching  . Morphine And Related Nausea And  Vomiting     Review of Systems: Review of Systems  Constitutional: Positive for chills and malaise/fatigue. Negative for fever.  Respiratory: Positive for shortness of breath. Negative for cough and wheezing.   Cardiovascular: Negative for chest pain and leg swelling.  Gastrointestinal: Negative for abdominal pain, constipation, diarrhea, nausea and vomiting.  Genitourinary: Negative for dysuria, flank pain and frequency.  Musculoskeletal: Positive for myalgias.  Skin: Negative for rash.  Neurological: Negative for dizziness, weakness and headaches.      OBJECTIVE: Vitals:   06/18/18 1951 06/18/18 2255 06/19/18 0358 06/19/18 0821  BP: 123/82 (!) 130/91 (!) 132/91 112/73  Pulse: 68 72 72 67  Resp: 20 (!) 22  (!) 25  Temp: 97.7 F (36.5 C) 98.5 F (36.9 C) 99 F (37.2 C) 98 F (36.7 C)  TempSrc: Oral Oral Oral Oral  SpO2: 96% 95% 96% 95%  Weight:      Height:       Body mass index is 28.69 kg/m.  Physical Exam Constitutional:      General: She is not in acute distress.    Appearance: She is well-developed. She is ill-appearing.     Comments: Lying in bed resting.   Cardiovascular:     Rate and Rhythm: Normal rate and regular rhythm.     Heart sounds: Normal heart sounds.  Pulmonary:     Effort: Pulmonary effort is normal.     Breath sounds: Normal breath sounds.  Skin:    General: Skin is warm and dry.  Neurological:  Mental Status: She is alert and oriented to person, place, and time.  Psychiatric:        Mood and Affect: Mood normal.     Lab Results Lab Results  Component Value Date   WBC 8.5 06/19/2018   HGB 7.3 (L) 06/19/2018   HCT 23.3 (L) 06/19/2018   MCV 87.9 06/19/2018   PLT 352 06/19/2018    Lab Results  Component Value Date   CREATININE 0.57 06/19/2018   BUN 5 (L) 06/19/2018   NA 137 06/19/2018   K 3.8 06/19/2018   CL 105 06/19/2018   CO2 22 06/19/2018    Lab Results  Component Value Date   ALT 9 06/19/2018   AST 19 06/19/2018     ALKPHOS 171 (H) 06/19/2018   BILITOT 0.7 06/19/2018     Microbiology: Recent Results (from the past 240 hour(s))  Culture, blood (Routine x 2)     Status: Abnormal (Preliminary result)   Collection Time: 06/14/18  2:30 PM  Result Value Ref Range Status   Specimen Description BLOOD RIGHT ANTECUBITAL  Final   Special Requests   Final    BOTTLES DRAWN AEROBIC AND ANAEROBIC Blood Culture adequate volume   Culture  Setup Time   Final    GRAM POSITIVE COCCI IN CHAINS IN BOTH AEROBIC AND ANAEROBIC BOTTLES CRITICAL VALUE NOTED.  VALUE IS CONSISTENT WITH PREVIOUSLY REPORTED AND CALLED VALUE.    Culture (A)  Final    STREPTOCOCCUS MITIS/ORALIS CORRECTED ON 02/06 AT 0086: PREVIOUSLY REPORTED AS VIRIDANS STREPTOCOCCUS ENTEROCOCCUS FAECALIS SUSCEPTIBILITIES TO FOLLOW Performed at [facility], [place] [address] [place], [place] [address]    Report Status PENDING  Incomplete  Culture, blood (Routine x 2)     Status: Abnormal   Collection Time: 06/14/18  2:35 PM  Result Value Ref Range Status   Specimen Description BLOOD RIGHT ANTECUBITAL  Final   Special Requests   Final    BOTTLES DRAWN AEROBIC AND ANAEROBIC Blood Culture results may not be optimal due to an excessive volume of blood received in culture bottles   Culture  Setup Time   Final    GRAM POSITIVE COCCI IN CHAINS IN BOTH AEROBIC AND ANAEROBIC BOTTLES CRITICAL RESULT CALLED TO, READ BACK BY AND VERIFIED WITH: Serita Grammes [id number] 06/15/2018 Mena Goes Performed at [facility], [place] [address], [place], [place] [address]    Culture STREPTOCOCCUS SANGUINIS ENTEROCOCCUS FAECALIS  (A)  Final   Report Status 06/18/2018 FINAL  Final   Organism ID, Bacteria STREPTOCOCCUS SANGUINIS  Final   Organism ID, Bacteria ENTEROCOCCUS FAECALIS  Final      Susceptibility   Streptococcus sanguinis - MIC*    PENICILLIN 4 RESISTANT Resistant     CEFTRIAXONE >=8 RESISTANT Resistant     ERYTHROMYCIN 2 RESISTANT Resistant      LEVOFLOXACIN 1 SENSITIVE Sensitive     VANCOMYCIN 1 SENSITIVE Sensitive     * STREPTOCOCCUS SANGUINIS   Enterococcus faecalis - MIC*    AMPICILLIN <=2 SENSITIVE Sensitive     VANCOMYCIN 1 SENSITIVE Sensitive     GENTAMICIN SYNERGY SENSITIVE Sensitive     * ENTEROCOCCUS FAECALIS  Blood Culture ID Panel (Reflexed)     Status: Abnormal   Collection Time: 06/14/18  2:35 PM  Result Value Ref Range Status   Enterococcus species NOT DETECTED NOT DETECTED Final   Listeria monocytogenes NOT DETECTED NOT DETECTED Final   Staphylococcus species NOT DETECTED NOT DETECTED Final   Staphylococcus aureus (BCID)  NOT DETECTED NOT DETECTED Final   Streptococcus species DETECTED (A) NOT DETECTED Final    Comment: Not Enterococcus species, Streptococcus agalactiae, Streptococcus pyogenes, or Streptococcus pneumoniae. CRITICAL RESULT CALLED TO, READ BACK BY AND VERIFIED WITH: J. LEDFORD,PHARMD [id number] 06/15/2018 T. TYSOR    Streptococcus agalactiae NOT DETECTED NOT DETECTED Final   Streptococcus pneumoniae NOT DETECTED NOT DETECTED Final   Streptococcus pyogenes NOT DETECTED NOT DETECTED Final   Acinetobacter baumannii NOT DETECTED NOT DETECTED Final   Enterobacteriaceae species NOT DETECTED NOT DETECTED Final   Enterobacter cloacae complex NOT DETECTED NOT DETECTED Final   Escherichia coli NOT DETECTED NOT DETECTED Final   Klebsiella oxytoca NOT DETECTED NOT DETECTED Final   Klebsiella pneumoniae NOT DETECTED NOT DETECTED Final   Proteus species NOT DETECTED NOT DETECTED Final   Serratia marcescens NOT DETECTED NOT DETECTED Final   Haemophilus influenzae NOT DETECTED NOT DETECTED Final   Neisseria meningitidis NOT DETECTED NOT DETECTED Final   Pseudomonas aeruginosa NOT DETECTED NOT DETECTED Final   Candida albicans NOT DETECTED NOT DETECTED Final   Candida glabrata NOT DETECTED NOT DETECTED Final   Candida krusei NOT DETECTED NOT DETECTED Final   Candida parapsilosis NOT DETECTED NOT DETECTED Final    Candida tropicalis NOT DETECTED NOT DETECTED Final    Comment: Performed at [facility], [place] [address], [place], [place] [address]  Urine Culture     Status: Abnormal   Collection Time: 06/14/18 10:03 PM  Result Value Ref Range Status   Specimen Description URINE, RANDOM  Final   Special Requests   Final    NONE Performed at [facility], [place] [address], [place], [place] [address]    Culture >=100,000 COLONIES/mL ESCHERICHIA COLI (A)  Final   Report Status 06/17/2018 FINAL  Final   Organism ID, Bacteria ESCHERICHIA COLI (A)  Final      Susceptibility   Escherichia coli - MIC*    AMPICILLIN >=32 RESISTANT Resistant     CEFAZOLIN 16 SENSITIVE Sensitive     CEFTRIAXONE <=1 SENSITIVE Sensitive     CIPROFLOXACIN >=4 RESISTANT Resistant     GENTAMICIN <=1 SENSITIVE Sensitive     IMIPENEM <=0.25 SENSITIVE Sensitive     NITROFURANTOIN <=16 SENSITIVE Sensitive     TRIMETH/SULFA >=320 RESISTANT Resistant     AMPICILLIN/SULBACTAM >=32 RESISTANT Resistant     PIP/TAZO >=128 RESISTANT Resistant     Extended ESBL NEGATIVE Sensitive     * >=100,000 COLONIES/mL ESCHERICHIA COLI  Culture, blood (routine x 2)     Status: None (Preliminary result)   Collection Time: 06/16/18  1:52 PM  Result Value Ref Range Status   Specimen Description BLOOD RIGHT ARM  Final   Special Requests   Final    BOTTLES DRAWN AEROBIC AND ANAEROBIC Blood Culture adequate volume   Culture   Final    NO GROWTH 3 DAYS Performed at [facility], [address] [place], [place] [address]    Report Status PENDING  Incomplete  Culture, blood (routine x 2)     Status: None (Preliminary result)   Collection Time: 06/16/18  1:55 PM  Result Value Ref Range Status   Specimen Description BLOOD LEFT HAND  Final   Special Requests   Final    BOTTLES DRAWN AEROBIC ONLY Blood Culture results may not be optimal due to an inadequate volume of blood received in culture bottles   Culture   Final    NO GROWTH 3  DAYS Performed  at [facility], [place] [address], [place], [place] [address]    Report Status PENDING  Incomplete     Terri Piedra, [place] [facility] [phone] Pager  06/19/2018  11:35 AM

## 2018-06-19 NOTE — Progress Notes (Signed)
[facility] Progress Note  Patient: Kelly [name] KDT:[id number]   PCP: [facility] DOB: 05/20/1985   DOA: 06/14/2018   DOS: 06/19/2018   Date of Service: the patient was seen and examined on 06/19/2018  Brief hospital course: Pt. 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  Patient is [redacted] weeks pregnant Currently further plan is current antibiotic and supportive measures.  Subjective: No nausea no vomiting no fever no chills no chest pain no abdominal pain.  Telemetry: Sinus rhythm  Assessment and Plan: 1.  Sepsis secondary to multifocal pneumonia. Streptococcal bacteremia.  Along with enterococcal bacteremia Influenza B infection IV drug abuse. 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 History of epidural abscess. Presents with cough malaise. Tachycardia with hypotension as well as leukocytosis. Elevated lactic acid. Chest x-ray was performed which shows multifocal pneumonia. Blood culture positive for streptococcal and enterococcus. Repeat cultures performed on 06/16/2018. Patient was initially started on IV vancomycin Rocephin and azithromycin transition her to IV Rocephin. ID consulted  Due to presence of enterococcus IV ampicillin was added to the regimen. Sensitivities are back and IV vancomycin was also added   Echocardiogram shows worsening vegetation 10 mm x 20 mm which was 8 mm x 9 mm last time. Systolic function normal 6 to 65%. 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 MRI of the lumbar spine was negative for any acute abnormality at this time. Cardiothoracic  surgery consulted, currently not a surgical candidate. Ob recommend continue current management Tamiflu added to the regimen after discussing with the pharmacy for its safety during pregnancy.  2.  IV heroin abuse. History of polysubstance abuse. Patient reports that she was on chronic methadone therapy and she stopped going to the methadone clinic in December 2019. She reports that she was 100 mg of methadone. she used to go to [facility] in [place] Discussed with [facility] clinic person, last methadone dose was 70 mg on April 06, 2018.  No longer a patient there. Patient reports that she has been using 25mlligrams of IV heroin. She is requesting to go to rehabilitation program on discharge. I have discussed with OB/GYN attending on call. Knowing that physiologically there will be demand for frequent dosing I will increase her last documented clinic dose of 70 mg to 80 mg and divide her 80 mg methadone dose to 40 mg BID. No further dose increase.  Monitor QTC while on methadone.  Currently stable.  Continue Phenergan for nausea and Zofran for refractory nausea. Benadryl for itching as well as other withdrawal symptoms. Patient reports that she has allergic reaction to Suboxone although on further questioning she mentions that she started having withdrawal symptoms while she was on Suboxone therapy and never had any rash or throat swelling that required hospitalization. Patient was clearly informed that there is no way that we can provide methadone on discharge prescription and therefore she has to contact her methadone clinic prior to discharge,  Discussed the case with the social worker, they provided the patient information regarding [facility] abuse treatment program for mothers. When close to discharge, [facility].  3.  20 weeks pregnancy. by ultrasound she is [redacted] weeks  pregnant. Daily monitoring by OB RN with fetal Doppler monitoring. For any OB related complaints contact OB/GYN attending on call. Will be calling OB if they do not round on the patient tomorrow.  4.  Elevated LFT. Does not appear to be having like helLp syndrome. No abdominal pain right now. this is all appears to be all in response to severe infection.  Now trending down.  5.  Physiological anemia of pregnancy. Dilutional worsening. Hemoglobin dropping to 6.6.  No active bleeding reported anywhere. S/P transfuse 1 PRBC based on my discussion with OB attending Likely the acute drop is secondary to dilution rather than acute bleeding. Now stable  Diet: Regular diet DVT Prophylaxis: subcutaneous Heparin  Advance goals of care discussion: full code  Family Communication: no family was present at bedside, at the time of interview.   Disposition:  Discharge to be determined.  Will likely need prolonged antibiotic course which needs to be completed here in the hospital.  Consultants: OB/GYN.,  ID, left call for CT surgery. Procedures:  Echocardiogram 06/16/2018. Tricuspid regurgitation severe by color flow Doppler.  There is a large mobile tricuspid valve vegetation on the anterior leaflet.  The TV vegetation measures 10 mm x 20 mm. The left ventricle has normal systolic function of 11-94%.  Scheduled Meds: . enoxaparin (LOVENOX) injection  40 mg Subcutaneous Daily  . feeding supplement (ENSURE ENLIVE)  237 mL Oral BID BM  . methadone  40 mg Oral Q12H  . oseltamivir  75 mg Oral BID  . prenatal multivitamin  1 tablet Oral Q1200  . sodium chloride flush  3 mL Intravenous Q12H   Continuous Infusions: . ampicillin (OMNIPEN) IV 2 g (06/19/18 1315)  . cefTRIAXone (ROCEPHIN)  IV 2 g (06/19/18 0948)  . vancomycin 200 mL/hr at 06/19/18 0417   PRN Meds: acetaminophen **OR** acetaminophen, benzonatate, diphenhydrAMINE, ondansetron (ZOFRAN) IV, promethazine Antibiotics: Anti-infectives  (From admission, onward)   Start     Dose/Rate Route Frequency Ordered Stop   06/18/18 2000  vancomycin (VANCOCIN) IVPB 1000 mg/200 mL premix     1,000 mg 200 mL/hr over 60 Minutes Intravenous Every 8 hours 06/18/18 1054     06/18/18 1200  vancomycin (VANCOCIN) 1,750 mg in sodium chloride 0.9 % 500 mL IVPB     1,750 mg 250 mL/hr over 120 Minutes Intravenous  Once 06/18/18 1054 06/18/18 2241   06/17/18 2200  cefTRIAXone (ROCEPHIN) 2 g in sodium chloride 0.9 % 100 mL IVPB     2 g 200 mL/hr over 30 Minutes Intravenous Every 12 hours 06/17/18 1046     06/17/18 1200  ampicillin (OMNIPEN) 2 g in sodium chloride 0.9 % 100 mL IVPB     2 g 300 mL/hr over 20 Minutes Intravenous Every 4 hours 06/17/18 1045     06/16/18 1300  oseltamivir (TAMIFLU) capsule 75 mg     75 mg Oral 2 times daily 06/16/18 1231 06/21/18 0959   06/15/18 0700  vancomycin (VANCOCIN) IVPB 1000 mg/200 mL premix  Status:  Discontinued     1,000 mg 200 mL/hr over 60 Minutes Intravenous Every 8 hours 06/14/18 2216 06/15/18 0711   06/15/18 0500  cefTRIAXone (ROCEPHIN) 2 g in sodium chloride 0.9 % 100 mL IVPB  Status:  Discontinued     2 g 200 mL/hr over 30 Minutes Intravenous Every 24 hours 06/15/18 0445 06/17/18 1046   06/14/18 2200  azithromycin (ZITHROMAX) 500 mg in sodium chloride 0.9 % 250 mL IVPB  Status:  Discontinued     500 mg 250 mL/hr over 60 Minutes Intravenous Every 24 hours 06/14/18 2134 06/15/18 0711   06/14/18 2200  cefTRIAXone (ROCEPHIN) 1 g in sodium chloride 0.9 % 100 mL IVPB  Status:  Discontinued     1 g 200 mL/hr over 30 Minutes Intravenous Every 24 hours 06/14/18 2134 06/15/18 0445   06/14/18 2200  vancomycin (VANCOCIN) 1,500 mg in sodium chloride 0.9 % 500 mL IVPB     1,500 mg 250 mL/hr over 120 Minutes Intravenous  Once 06/14/18 2134 06/15/18 0235       Objective: Physical Exam: Vitals:   06/18/18 2255 06/19/18 0358 06/19/18 0821 06/19/18 1222  BP: (!) 130/91 (!) 132/91 112/73 116/81  Pulse: 72 72  67 74  Resp: (!) 22  (!) 25 (!) 22  Temp: 98.5 F (36.9 C) 99 F (37.2 C) 98 F (36.7 C) 98.2 F (36.8 C)  TempSrc: Oral Oral Oral Oral  SpO2: 95% 96% 95%   Weight:      Height:        Intake/Output Summary (Last 24 hours) at 06/19/2018 1339 Last data filed at 06/19/2018 0417 Gross per 24 hour  Intake 2320.72 ml  Output 450 ml  Net 1870.72 ml   Filed Weights   06/15/18 0015 06/16/18 0533 06/18/18 0339  Weight: 79.7 kg 80 kg 83.1 kg   General: Alert, Awake and Oriented to Time, Place and Person. Appear in MODERATE distress, affect irritable Eyes: PERRL, Conjunctiva normal ENT: Oral Mucosa clear moist. Neck: difficult to assess  JVD, no Abnormal Mass Or lumps Cardiovascular: S1 and S2 Present, no Murmur, Peripheral Pulses Present Respiratory: increased respiratory effort, Bilateral Air entry equal and Decreased, no use of accessory muscle, bilateral  Crackles, OCCASIONAL wheezes Abdomen: Bowel Sound present, Soft and no tenderness, no hernia Skin: no redness, no Rash, no induration Extremities: bilateral  Pedal edema, no calf tenderness Neurologic: Grossly no focal neuro deficit. Bilaterally Equal motor strength  Data Reviewed: CBC: Recent Labs  Lab 06/15/18 0042 06/16/18 0935 06/17/18 0721 06/17/18 1657 06/18/18 0603 06/19/18 0229  WBC 12.6* 7.5 7.2 8.4 9.4 8.5  NEUTROABS 10.6* 4.9 4.7  --  7.3 5.5  HGB 8.0* 7.4* 6.6* 7.9* 7.5* 7.3*  HCT 25.3* 23.9* 21.6* 24.9* 23.6* 23.3*  MCV 84.3 87.5 87.4 86.2 86.4 87.9  PLT 367 420* 376 369 404* 570   Basic Metabolic Panel: Recent Labs  Lab 06/15/18 0042 06/16/18 0935 06/17/18 0721 06/18/18 0603 06/19/18 0229  NA 131* 140 138 137 137  K 3.5 3.9 3.7 3.7 3.8  CL 97* 107 106 107 105  CO2 22 24 21* 22 22  GLUCOSE 123* 84 89 99 81  BUN 5* 8 6 5* 5*  CREATININE 0.60 0.58 0.50 0.56 0.57  CALCIUM 8.2* 8.1* 8.1* 8.0* 7.9*  MG  --  1.8  --   --   --     Liver Function Tests: Recent Labs  Lab 06/15/18 0042 06/16/18 0935  06/17/18 0721 06/18/18 0603 06/19/18 0229  AST 231* 113* 34 18 19  ALT 19 16 11 10 9   ALKPHOS 161* 147* 174* 182* 171*  BILITOT 0.6 0.1* 0.3 0.8 0.7  PROT 6.6 6.0* 5.9* 6.0* 5.8*  ALBUMIN 1.7* 1.5* 1.5* 1.6* 1.6*   No results for input(s): LIPASE, AMYLASE in the last 168 hours. No results for input(s): AMMONIA in the last 168 hours. Coagulation Profile:  Recent Labs  Lab 06/14/18 1429  INR 0.99   Cardiac Enzymes: No results for input(s): CKTOTAL, CKMB, CKMBINDEX, TROPONINI in the last 168 hours. BNP (last 3 results) No results for input(s): PROBNP in the last 8760 hours. CBG: Recent Labs  Lab 06/17/18 0807 06/17/18 0849 06/18/18 0806 06/19/18 0841  GLUCAP 66* 96 81 78   Studies: [place] Ekg Site Rite  Result Date: 06/19/2018 If [employer] not attached, placement could not be confirmed due to current cardiac rhythm.    Time spent: 35 minutes   Author: Berle Mull, MD [facility] 06/19/2018 1:39 PM  Between 7PM-7AM, please contact night-coverage at [email or web address]

## 2018-06-20 ENCOUNTER — Encounter (HOSPITAL_COMMUNITY): Payer: Self-pay | Admitting: Obstetrics and Gynecology

## 2018-06-20 DIAGNOSIS — O26891 Other specified pregnancy related conditions, first trimester: Secondary | ICD-10-CM

## 2018-06-20 DIAGNOSIS — Z95828 Presence of other vascular implants and grafts: Secondary | ICD-10-CM

## 2018-06-20 LAB — COMPREHENSIVE METABOLIC PANEL
ALT: 7 U/L (ref 0–44)
AST: 17 U/L (ref 15–41)
Albumin: 1.5 g/dL — ABNORMAL LOW (ref 3.5–5.0)
Alkaline Phosphatase: 164 U/L — ABNORMAL HIGH (ref 38–126)
Anion gap: 9 (ref 5–15)
BUN: 5 mg/dL — ABNORMAL LOW (ref 6–20)
CO2: 23 mmol/L (ref 22–32)
Calcium: 7.6 mg/dL — ABNORMAL LOW (ref 8.9–10.3)
Chloride: 102 mmol/L (ref 98–111)
Creatinine, Ser: 0.5 mg/dL (ref 0.44–1.00)
GFR calc Af Amer: >60 mL/min (ref >60)
Glucose, Bld: 99 mg/dL (ref 70–99)
Potassium: 3.4 mmol/L — ABNORMAL LOW (ref 3.5–5.1)
Sodium: 134 mmol/L — ABNORMAL LOW (ref 135–145)
Total Bilirubin: 0.4 mg/dL (ref 0.3–1.2)
Total Protein: 5.5 g/dL — ABNORMAL LOW (ref 6.5–8.1)

## 2018-06-20 LAB — DIFFERENTIAL
Abs Immature Granulocytes: 0.16 10*3/uL — ABNORMAL HIGH (ref 0.00–0.07)
BASOS ABS: 0 10*3/uL (ref 0.0–0.1)
Basophils Relative: 0 %
Eosinophils Absolute: 0 10*3/uL (ref 0.0–0.5)
Eosinophils Relative: 0 %
Immature Granulocytes: 1 %
LYMPHS PCT: 16 %
Lymphs Abs: 1.7 10*3/uL (ref 0.7–4.0)
Monocytes Absolute: 0.8 10*3/uL (ref 0.1–1.0)
Monocytes Relative: 7 %
NEUTROS ABS: 8.3 10*3/uL — AB (ref 1.7–7.7)
Neutrophils Relative %: 76 %

## 2018-06-20 LAB — CBC WITH DIFFERENTIAL/PLATELET
Abs Immature Granulocytes: 0.23 10*3/uL — ABNORMAL HIGH (ref 0.00–0.07)
Basophils Absolute: 0 10*3/uL (ref 0.0–0.1)
Basophils Relative: 0 %
Eosinophils Absolute: 0 10*3/uL (ref 0.0–0.5)
Eosinophils Relative: 0 %
HCT: 22.9 % — ABNORMAL LOW (ref 36.0–46.0)
Hemoglobin: 6.8 g/dL — CL (ref 12.0–15.0)
Immature Granulocytes: 2 %
Lymphocytes Relative: 17 %
Lymphs Abs: 1.8 10*3/uL (ref 0.7–4.0)
MCH: 26.6 pg (ref 26.0–34.0)
MCHC: 29.7 g/dL — ABNORMAL LOW (ref 30.0–36.0)
MCV: 89.5 fL (ref 80.0–100.0)
Monocytes Absolute: 0.8 10*3/uL (ref 0.1–1.0)
Monocytes Relative: 8 %
NEUTROS ABS: 7.5 10*3/uL (ref 1.7–7.7)
NEUTROS PCT: 73 %
Platelets: 304 10*3/uL (ref 150–400)
RBC: 2.56 MIL/uL — ABNORMAL LOW (ref 3.87–5.11)
RDW: 16 % — AB (ref 11.5–15.5)
WBC: 10.4 10*3/uL (ref 4.0–10.5)
nRBC: 0 % (ref 0.0–0.2)

## 2018-06-20 LAB — CBC
HEMATOCRIT: 26.3 % — AB (ref 36.0–46.0)
HEMOGLOBIN: 8 g/dL — AB (ref 12.0–15.0)
MCH: 27.8 pg (ref 26.0–34.0)
MCHC: 30.4 g/dL (ref 30.0–36.0)
MCV: 91.3 fL (ref 80.0–100.0)
Platelets: 302 10*3/uL (ref 150–400)
RBC: 2.88 MIL/uL — ABNORMAL LOW (ref 3.87–5.11)
RDW: 15.6 % — ABNORMAL HIGH (ref 11.5–15.5)
WBC: 11.1 10*3/uL — ABNORMAL HIGH (ref 4.0–10.5)
nRBC: 0 % (ref 0.0–0.2)

## 2018-06-20 LAB — GLUCOSE, CAPILLARY: Glucose-Capillary: 84 mg/dL (ref 70–99)

## 2018-06-20 LAB — PREPARE RBC (CROSSMATCH)

## 2018-06-20 LAB — HEMOGLOBIN AND HEMATOCRIT, BLOOD
HCT: 26.3 % — ABNORMAL LOW (ref 36.0–46.0)
Hemoglobin: 8 g/dL — ABNORMAL LOW (ref 12.0–15.0)

## 2018-06-20 MED ORDER — SODIUM CHLORIDE 0.9% IV SOLUTION
Freq: Once | INTRAVENOUS | Status: AC
  Administered 2018-06-20: 09:00:00 via INTRAVENOUS

## 2018-06-20 NOTE — Progress Notes (Signed)
[facility] for Infectious Disease  Date of Admission:  06/14/2018     Total days of antibiotics 7         ASSESSMENT/PLAN  Ms. Chriswell has polymicrobial bacteremia with Enterococcus faecalis, Streptococcus sanguinis, and Streptococcus viridans with concern for endocarditis and complicated by Influenza B. Appears to be improving slowly and is on Day 7 of antimicrobial therapy with ampicillin, ceftriaxone and vancomycin and tolerating the regimen well. Renal function remaining stable. Now has a PICC line for prolonged therapy of at least 6 weeks. Will be completing Tamiflu today for treatment of Influenza B.   1. Continue current dose of ampicillin, ceftriaxone, and vancomycin. 2. Discontinue Tamiflu at completion of treatment tonight. 3. Opioid use disorder per primary team. 4. Monitor cultures and fevers.  5. Therapeutic drug monitoring for vancomycin with biweekly BMET and vancomycin dosing per pharmacy.   Dr. Megan Salon is available as needed over the weekend.   Principal Problem:   Bacteremia due to Enterococcus Active Problems:   IV drug abuse complicating pregnancy ([place])   Severe tricuspid regurgitation by prior echocardiogram   Influenza B   Opioid use disorder, severe, dependence (HCC)   Sepsis due to pneumonia [facility])   Multifocal pneumonia   Hyponatremia   Elevated transaminase level   Degenerative arthritis of lumbar spine   History of bacterial endocarditis   . enoxaparin (LOVENOX) injection  40 mg Subcutaneous Daily  . feeding supplement (ENSURE ENLIVE)  237 mL Oral BID BM  . methadone  40 mg Oral Q12H  . prenatal multivitamin  1 tablet Oral Q1200  . sodium chloride flush  10-40 mL Intracatheter Q12H  . sodium chloride flush  3 mL Intravenous Q12H    SUBJECTIVE:  Afebrile overnight with stable WBC count. No acute events overnight.  Feeling a little better today.   Allergies  Allergen Reactions  . Hydrocodone Itching  . Morphine And Related Nausea  And Vomiting     Review of Systems: Review of Systems  Constitutional: Positive for malaise/fatigue. Negative for chills, fever and weight loss.  Respiratory: Negative for cough, shortness of breath and wheezing.   Cardiovascular: Negative for chest pain and leg swelling.  Gastrointestinal: Negative for abdominal pain, constipation, diarrhea, nausea and vomiting.  Musculoskeletal: Positive for myalgias.  Skin: Negative for rash.      OBJECTIVE: Vitals:   06/20/18 0832 06/20/18 1046 06/20/18 1059 06/20/18 1115  BP: 119/86 (!) 125/91 (!) 125/91 123/88  Pulse:      Resp:      Temp: 98.1 F (36.7 C) 98 F (36.7 C) 98 F (36.7 C) 98.1 F (36.7 C)  TempSrc: Oral Oral Oral Oral  SpO2:      Weight:      Height:       Body mass index is 28.69 kg/m.  Physical Exam Constitutional:      General: She is not in acute distress.    Appearance: She is well-developed.  Cardiovascular:     Rate and Rhythm: Normal rate and regular rhythm.     Heart sounds: Normal heart sounds.  Pulmonary:     Effort: Pulmonary effort is normal.     Breath sounds: Normal breath sounds.  Skin:    General: Skin is warm and dry.  Neurological:     Mental Status: She is alert.  Psychiatric:        Mood and Affect: Mood normal.     Lab Results Lab Results  Component Value Date   WBC  10.4 06/20/2018   HGB 6.8 (LL) 06/20/2018   HCT 22.9 (L) 06/20/2018   MCV 89.5 06/20/2018   PLT 304 06/20/2018    Lab Results  Component Value Date   CREATININE 0.50 06/20/2018   BUN 5 (L) 06/20/2018   NA 134 (L) 06/20/2018   K 3.4 (L) 06/20/2018   CL 102 06/20/2018   CO2 23 06/20/2018    Lab Results  Component Value Date   ALT 7 06/20/2018   AST 17 06/20/2018   ALKPHOS 164 (H) 06/20/2018   BILITOT 0.4 06/20/2018     Microbiology: Recent Results (from the past 240 hour(s))  Culture, blood (Routine x 2)     Status: Abnormal   Collection Time: 06/14/18  2:30 PM  Result Value Ref Range Status    Specimen Description BLOOD RIGHT ANTECUBITAL  Final   Special Requests   Final    BOTTLES DRAWN AEROBIC AND ANAEROBIC Blood Culture adequate volume   Culture  Setup Time   Final    GRAM POSITIVE COCCI IN CHAINS IN BOTH AEROBIC AND ANAEROBIC BOTTLES CRITICAL VALUE NOTED.  VALUE IS CONSISTENT WITH PREVIOUSLY REPORTED AND CALLED VALUE. Performed at [facility], [place] [address] [place], [place] [address]    Culture (A)  Final    STREPTOCOCCUS MITIS/ORALIS CORRECTED ON 02/06 AT 0350: PREVIOUSLY REPORTED AS VIRIDANS STREPTOCOCCUS ENTEROCOCCUS FAECALIS    Report Status 06/19/2018 FINAL  Final   Organism ID, Bacteria STREPTOCOCCUS MITIS/ORALIS  Final      Susceptibility   Streptococcus mitis/oralis - MIC*    TETRACYCLINE >=16 RESISTANT Resistant     VANCOMYCIN 0.5 SENSITIVE Sensitive     CLINDAMYCIN <=0.25 SENSITIVE Sensitive     CEFTRIAXONE Value in next row Sensitive      SENSITIVE0.5    PENICILLIN Value in next row Intermediate      INTERMEDIATE0.25    * STREPTOCOCCUS MITIS/ORALIS CORRECTED ON 02/06 AT 0938: PREVIOUSLY REPORTED AS VIRIDANS STREPTOCOCCUS  Culture, blood (Routine x 2)     Status: Abnormal   Collection Time: 06/14/18  2:35 PM  Result Value Ref Range Status   Specimen Description BLOOD RIGHT ANTECUBITAL  Final   Special Requests   Final    BOTTLES DRAWN AEROBIC AND ANAEROBIC Blood Culture results may not be optimal due to an excessive volume of blood received in culture bottles   Culture  Setup Time   Final    GRAM POSITIVE COCCI IN CHAINS IN BOTH AEROBIC AND ANAEROBIC BOTTLES CRITICAL RESULT CALLED TO, READ BACK BY AND VERIFIED WITH: Serita Grammes 1829 06/15/2018 Mena Goes Performed at [facility], [place] [address] [place], [place] [address]    Culture STREPTOCOCCUS SANGUINIS ENTEROCOCCUS FAECALIS  (A)  Final   Report Status 06/18/2018 FINAL  Final   Organism ID, Bacteria STREPTOCOCCUS SANGUINIS  Final   Organism ID, Bacteria ENTEROCOCCUS FAECALIS   Final      Susceptibility   Streptococcus sanguinis - MIC*    PENICILLIN 4 RESISTANT Resistant     CEFTRIAXONE >=8 RESISTANT Resistant     ERYTHROMYCIN 2 RESISTANT Resistant     LEVOFLOXACIN 1 SENSITIVE Sensitive     VANCOMYCIN 1 SENSITIVE Sensitive     * STREPTOCOCCUS SANGUINIS   Enterococcus faecalis - MIC*    AMPICILLIN <=2 SENSITIVE Sensitive     VANCOMYCIN 1 SENSITIVE Sensitive     GENTAMICIN SYNERGY SENSITIVE Sensitive     * ENTEROCOCCUS FAECALIS  Blood Culture ID Panel (Reflexed)     Status:  Abnormal   Collection Time: 06/14/18  2:35 PM  Result Value Ref Range Status   Enterococcus species NOT DETECTED NOT DETECTED Final   Listeria monocytogenes NOT DETECTED NOT DETECTED Final   Staphylococcus species NOT DETECTED NOT DETECTED Final   Staphylococcus aureus (BCID) NOT DETECTED NOT DETECTED Final   Streptococcus species DETECTED (A) NOT DETECTED Final    Comment: Not Enterococcus species, Streptococcus agalactiae, Streptococcus pyogenes, or Streptococcus pneumoniae. CRITICAL RESULT CALLED TO, READ BACK BY AND VERIFIED WITH: J. LEDFORD,PHARMD 0865 06/15/2018 T. TYSOR    Streptococcus agalactiae NOT DETECTED NOT DETECTED Final   Streptococcus pneumoniae NOT DETECTED NOT DETECTED Final   Streptococcus pyogenes NOT DETECTED NOT DETECTED Final   Acinetobacter baumannii NOT DETECTED NOT DETECTED Final   Enterobacteriaceae species NOT DETECTED NOT DETECTED Final   Enterobacter cloacae complex NOT DETECTED NOT DETECTED Final   Escherichia coli NOT DETECTED NOT DETECTED Final   Klebsiella oxytoca NOT DETECTED NOT DETECTED Final   Klebsiella pneumoniae NOT DETECTED NOT DETECTED Final   Proteus species NOT DETECTED NOT DETECTED Final   Serratia marcescens NOT DETECTED NOT DETECTED Final   Haemophilus influenzae NOT DETECTED NOT DETECTED Final   Neisseria meningitidis NOT DETECTED NOT DETECTED Final   Pseudomonas aeruginosa NOT DETECTED NOT DETECTED Final   Candida albicans NOT  DETECTED NOT DETECTED Final   Candida glabrata NOT DETECTED NOT DETECTED Final   Candida krusei NOT DETECTED NOT DETECTED Final   Candida parapsilosis NOT DETECTED NOT DETECTED Final   Candida tropicalis NOT DETECTED NOT DETECTED Final    Comment: Performed at [facility], [place] [address], [place], [place] [address]  Urine Culture     Status: Abnormal   Collection Time: 06/14/18 10:03 PM  Result Value Ref Range Status   Specimen Description URINE, RANDOM  Final   Special Requests   Final    NONE Performed at [facility], [place] [address], [place], [place] [address]    Culture >=100,000 COLONIES/mL ESCHERICHIA COLI (A)  Final   Report Status 06/17/2018 FINAL  Final   Organism ID, Bacteria ESCHERICHIA COLI (A)  Final      Susceptibility   Escherichia coli - MIC*    AMPICILLIN >=32 RESISTANT Resistant     CEFAZOLIN 16 SENSITIVE Sensitive     CEFTRIAXONE <=1 SENSITIVE Sensitive     CIPROFLOXACIN >=4 RESISTANT Resistant     GENTAMICIN <=1 SENSITIVE Sensitive     IMIPENEM <=0.25 SENSITIVE Sensitive     NITROFURANTOIN <=16 SENSITIVE Sensitive     TRIMETH/SULFA >=320 RESISTANT Resistant     AMPICILLIN/SULBACTAM >=32 RESISTANT Resistant     PIP/TAZO >=128 RESISTANT Resistant     Extended ESBL NEGATIVE Sensitive     * >=100,000 COLONIES/mL ESCHERICHIA COLI  Culture, blood (routine x 2)     Status: None (Preliminary result)   Collection Time: 06/16/18  1:52 PM  Result Value Ref Range Status   Specimen Description BLOOD RIGHT ARM  Final   Special Requests   Final    BOTTLES DRAWN AEROBIC AND ANAEROBIC Blood Culture adequate volume   Culture   Final    NO GROWTH 4 DAYS Performed at [facility], [address] [place],  [address]    Report Status PENDING  Incomplete  Culture, blood (routine x 2)     Status: None (Preliminary result)   Collection Time: 06/16/18  1:55 PM  Result Value Ref Range Status   Specimen Description BLOOD LEFT HAND  Final  Special  Requests   Final    BOTTLES DRAWN AEROBIC ONLY Blood Culture results may not be optimal due to an inadequate volume of blood received in culture bottles   Culture   Final    NO GROWTH 4 DAYS Performed at [facility], [place] [address], [place], [place] [address]    Report Status PENDING  Incomplete     Terri Piedra, [place] for [place] Pager  06/20/2018  11:30 AM

## 2018-06-20 NOTE — Progress Notes (Signed)
PROGRESS NOTE  Kelly [name] MWN:[id number] DOB: 07-07-1985 DOA: 06/14/2018 PCP: [facility]  Brief History   Pt. 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  Patient is [redacted] weeks pregnant.   OB Gyn has consulted and has recommended a number of prenatal and screening labs which have been ordered.  Currently further plan is current antibiotic and supportive measures.  A & P  Sepsis secondary to multifocal pneumonia and streptococcal/enterococcal bacteremia, and Influenza B: Sepsis resolving. Pt presented with cough, malaise, hypotension, and tachycardia, leukocytosis, and elevated lactic acid. ID has been consulted and the patient has been transitioned to IV Rocephin, ampicillin, and vancomycin. The patient is also receiving Tamiflu once pharmacy vouched for its safety in pregnancy.  Tricuspid valve endocarditis with vegetation size 20m x 261m CTS has been consulted. 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 Echocardiogram shows worsening vegetation 10 mm x 20 mm which was 8 mm x 9 mm last time. 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 MRI of the lumbar spine was negative for any acute abnormality at this time. Cardiothoracic surgery consulted, currently not a surgical candidate. OBGyn recommend continue current management.  IV heroin abuse. History of polysubstance abuse: Patient reports that she was on chronic methadone therapy at 100 mg daily. She stopped going to the methadone clinic in December 2019. [facility] the patient's last methadone dose was 70 mg in 03/2018. She is  no longer a patient there.Patient reports that she has been using 29m15migrams of IV heroin.She is requesting to go to rehabilitation program on discharge. I have discussed with OB/GYN attending on call. They have recommended that the patient undergo BTL after baby delivered. Knowing that physiologically there will be demand for frequent dosing I will increase her last documented clinic dose of 70 mg to 80 mg and divide her 80 mg methadone dose to 40 mg BID. There will be no further dose increase.  Monitor QTC while on methadone.  Currently stable.  Continue Phenergan for nausea and Zofran for refractory nausea. Benadryl for itching as well as other withdrawal symptoms. Patient reports that she has allergic reaction to Suboxone although on further questioning she mentions that she started having withdrawal symptoms while she was on Suboxone therapy and never had any rash or throat swelling that required hospitalization. Patient was clearly informed that there is no way that we can provide methadone on discharge prescription and therefore she has to contact her methadone clinic prior to discharge. Discussed the case with the social worker, they provided the patient information regarding [facility] treatment program for mothers. When close to discharge, we [facility].  20 weeks IUP by ultrasound: OB GYN has been consutled. The fetal heart rate is being monitored daily by OB [facility]. For any OB related complaints contact OB/GYN attending on call. I appreciate OB/Gyn's assistance.  Elevated LFT: Monitor. No abdominal pain. Likely reactive in response to acute infection. Trending down. Does not appear to be having like helLp syndrome.  Physiological anemia of pregnancy: A bit worse due  to dilution. The patient is receiving 2 units PRBC's in transfusion today.   Diet: Regular diet DVT Prophylaxis:  subcutaneous Heparin Advance goals of care discussion: full code Family Communication: Emergency contact is [name] ([place]) [phone]  Disposition: Discharge to be determined.  Will likely need prolonged antibiotic course which needs to be completed here in the hospital.   Karie Kirks, DO [facility] Direct contact: see [email or web address]  7PM-7AM contact night coverage as above 06/20/2018, 6:41 PM  LOS: 5 days   Consultants  . Cardiothoracic surgery, Infectious Disease, and OB/Gyn  Interval History/Subjective  The patient is awake, alert, and oriented x 3. No acute distress.  Objective   Vitals:  Vitals:   06/20/18 1316 06/20/18 1707  BP: 123/85 (!) 131/97  Pulse: 73 72  Resp: (!) 25 (!) 21  Temp: 98.2 F (36.8 C) 97.9 F (36.6 C)  SpO2: 93% 97%    Exam:  Constitutional:  . The patient is awake, alert, and oriented x 3. No acute distress. Respiratory:  . No wheezes, rales, or rhonchi. . No increased work of breathing.  . No tactile fremitus. Cardiovascular:  . Regular rate and rhythm.RR, no murmurs, ectopy, or gallups. . No LE extremity edema   . Normal pedal pulses Abdomen:  . Abdomen gravid. Non-tender, non-distended. . No hernias, massess or organomegaly can be appreciated. . Normoactive bowel sounds. Musculoskeletal:  . Digits/nails BUE: no clubbing, cyanosis, petechiae, infection . exam of joints, bones, muscles of at least one of following: head/neck, RUE, LUE, RLE, LLE   o strength and tone normal, no atrophy, no abnormal movements o No tenderness, masses o Normal ROM, no contractures  Skin:  . No rashes, lesions, ulcers . palpation of skin: no induration or nodules Neurologic:  . CN 2-12 intact . Sensation all 4 extremities intact Psychiatric:  . Mental status o Mood, affect appropriate o Orientation to person, place, time  . judgment and insight appear intact   I have seen and examined this patient myself. I have spent 35 minutes in  her evaluation and care. I have personally reviewed the following:   Today's Data  . Laboratory and vitals   Scheduled Meds: . enoxaparin (LOVENOX) injection  40 mg Subcutaneous Daily  . feeding supplement (ENSURE ENLIVE)  237 mL Oral BID BM  . methadone  40 mg Oral Q12H  . prenatal multivitamin  1 tablet Oral Q1200  . sodium chloride flush  10-40 mL Intracatheter Q12H  . sodium chloride flush  3 mL Intravenous Q12H   Continuous Infusions: . ampicillin (OMNIPEN) IV 2 g (06/20/18 1503)  . cefTRIAXone (ROCEPHIN)  IV 2 g (06/20/18 0948)  . vancomycin 2,000 mg (06/20/18 1034)    Principal Problem:   Bacteremia due to Enterococcus Active Problems:   Opioid use disorder, severe, dependence (HCC)   Sepsis due to pneumonia ([place])   IV drug abuse complicating pregnancy ([place])   Multifocal pneumonia   Hyponatremia   Elevated transaminase level   Degenerative arthritis of lumbar spine   History of bacterial endocarditis   Severe tricuspid regurgitation by prior echocardiogram   Influenza B   LOS: 5 days

## 2018-06-20 NOTE — Consult Note (Addendum)
Obstetrics & Gynecology Consult Note  Date of Consult: 06/20/2018   Requesting Provider: [facility]  Primary OBGYN: None Primary Care Provider: [facility]  Reason for Consult: pregnancy  History of Present Illness: Ms. Sproull is a 33 y.o. [id number] @ 20/6 (Kn[place] 6/20, dated by 9wk u/s), with the above CC.  No preterm labor s/s    ROS: A 12-point review of systems was performed and negative, except as stated in the above HPI.  OBGYN History: As per HPI. OB History  Gravida Para Term Preterm AB Living  6 2 1   3 2   SAB TAB Ectopic Multiple Live Births  3       2    # Outcome Date GA Lbr Len/2nd Weight Sex Delivery Anes PTL Lv  6 Current           5 Para 05/2007     CS-LTranv   LIV     Complications: Failure to Progress in First Stage  4 Term           3 SAB           2 SAB           1 SAB              Past Medical History: Past Medical History:  Diagnosis Date  . 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 Abscess of breast    rt breast  . ADD (attention deficit disorder)   . Back pain   . Bipolar 1 disorder ([place])   . Hypertension   . Leukocytosis   . Polysubstance abuse ([place])   . Sepsis ([place]) 12/02/2017  . Suicidal overdose [facility])     Past Surgical History: Past Surgical History:  Procedure Laterality Date  . BACK SURGERY     scoliosis-in care everywhere 08/1999  . CESAREAN SECTION    . CHOLECYSTECTOMY  March 2015  . FOREIGN BODY REMOVAL Right 05/28/2016   Procedure: REMOVAL FOREIGN BODY RIGHT ARM;  Surgeon: Meredith Pel, MD;  Location: [facility];  Service: Orthopedics;  Laterality: Right;  . IRRIGATION AND DEBRIDEMENT ABSCESS Right 07/13/2014   Procedure: IRRIGATION AND DEBRIDEMEN TRIGHT BREAST ABSCESS;  Surgeon: Donnie Mesa, MD;  Location: [facility];  Service: General;  Laterality: Right;  . LUMBAR LAMINECTOMY/DECOMPRESSION MICRODISCECTOMY N/A 12/05/2017   Procedure: LUMBAR LAMINECTOMY FOR EPIDURAL  ABSCESS;  Surgeon: Newman Pies, MD;  Location: [facility];  Service: Neurosurgery;  Laterality: N/A;  . RADIOLOGY WITH ANESTHESIA N/A 12/05/2017   Procedure: MRI WITH ANESTHESIA OF THORCIC AND LUMBAR SPINE WITH AND WITHOUT;  Surgeon: Radiologist, Medication, MD;  Location: [facility];  Service: Radiology;  Laterality: N/A;    Family History:  Family History  Problem Relation Age of Onset  . Diabetes Mother   . Diabetes Father   . Hypertension Father   . Heart attack Neg Hx   . Hyperlipidemia Neg Hx   . Sudden death Neg Hx     Social History:  Social History   Socioeconomic History  . Marital status: Single    Spouse name: Not on file  . Number of children: Not on file  . Years of education: Not on file  . Highest education level: Not on file  Occupational History  . Not on file  Social Needs  . Financial resource strain: Not on file  . Food insecurity:    Worry: Not on file    Inability: Not on file  .  Transportation needs:    Medical: Not on file    Non-medical: Not on file  Tobacco Use  . Smoking status: Current Some Day Smoker    Packs/day: 1.00    Years: 13.00    Pack years: 13.00    Types: Cigarettes  . Smokeless tobacco: Never Used  Substance and Sexual Activity  . Alcohol use: Yes    Comment: one or two drinks weekly  . Drug use: Yes    Types: Cocaine, Heroin, IV    Comment: heroin, crack cocaine last used 06/13/2018  . Sexual activity: Yes    Birth control/protection: Condom  Lifestyle  . Physical activity:    Days per week: Not on file    Minutes per session: Not on file  . Stress: Not on file  Relationships  . Social connections:    Talks on phone: Not on file    Gets together: Not on file    Attends religious service: Not on file    Active member of club or organization: Not on file    Attends meetings of clubs or organizations: Not on file    Relationship status: Not on file  . Intimate partner violence:    Fear of current or ex partner: Not on  file    Emotionally abused: Not on file    Physically abused: Not on file    Forced sexual activity: Not on file  Other Topics Concern  . Not on file  Social History Narrative  . Not on file    Allergy: Allergies  Allergen Reactions  . Hydrocodone Itching  . Morphine And Related Nausea And Vomiting    Current Outpatient Medications: Medications Prior to Admission  Medication Sig Dispense Refill Last Dose  . acetaminophen (TYLENOL) 500 MG tablet Take 2 tablets (1,000 mg total) by mouth every 8 (eight) hours. (Patient not taking: Reported on 04/01/2018) 30 tablet 0 Not Taking at Unknown time  . ferrous sulfate 325 (65 FE) MG tablet Take 1 tablet (325 mg total) by mouth daily with breakfast. (Patient not taking: Reported on 04/01/2018) 30 tablet 0 Not Taking at Unknown time  . magnesium oxide (MAG-OX) 400 (241.3 Mg) MG tablet Take 1 tablet (400 mg total) by mouth 2 (two) times daily. (Patient not taking: Reported on 04/01/2018) 60 tablet 0 Not Taking at Unknown time  . methadone (DOLOPHINE) 10 MG/5ML solution Take 30 mLs (60 mg total) by mouth daily. (Patient not taking: Reported on 05/08/2018)  0 Not Taking at Unknown time  . nicotine polacrilex (NICORETTE) 2 MG gum Take 1 each (2 mg total) by mouth as needed for smoking cessation. (Patient not taking: Reported on 04/01/2018) 100 tablet 0 Not Taking at Unknown time  . pantoprazole (PROTONIX) 40 MG tablet Take 1 tablet (40 mg total) by mouth daily. (Patient not taking: Reported on 04/01/2018) 30 tablet 0 Not Taking at Unknown time  . polyethylene glycol (MIRALAX / GLYCOLAX) packet Take 17 g by mouth 2 (two) times daily. (Patient not taking: Reported on 04/01/2018) 60 each 0 Not Taking at Unknown time  . promethazine (PHENERGAN) 25 MG tablet Take 1 tablet (25 mg total) by mouth every 6 (six) hours as needed for nausea or vomiting. (Patient not taking: Reported on 06/14/2018) 30 tablet 0 Not Taking at Unknown time  . senna-docusate (SENOKOT-S)  8.6-50 MG tablet Take 1 tablet by mouth 2 (two) times daily. (Patient not taking: Reported on 04/01/2018) 60 tablet 0 Not Taking at Unknown time  Hospital Medications: Current Facility-Administered Medications  Medication Dose Route Frequency Provider Last Rate Last Dose  . acetaminophen (TYLENOL) tablet 650 mg  650 mg Oral Q6H PRN Opyd, Ilene Qua, MD   650 mg at 06/17/18 1939   Or  . acetaminophen (TYLENOL) suppository 650 mg  650 mg Rectal Q6H PRN Opyd, Ilene Qua, MD      . ampicillin (OMNIPEN) 2 g in sodium chloride 0.9 % 100 mL IVPB  2 g Intravenous Q4H [place], FNP 300 mL/hr at 06/20/18 1503 2 g at 06/20/18 1503  . benzonatate (TESSALON) capsule 200 mg  200 mg Oral TID PRN Lovey Newcomer T, NP   200 mg at 06/18/18 2030  . cefTRIAXone (ROCEPHIN) 2 g in sodium chloride 0.9 % 100 mL IVPB  2 g Intravenous Q12H Susa Raring, RPH 200 mL/hr at 06/20/18 0948 2 g at 06/20/18 0948  . diphenhydrAMINE (BENADRYL) injection 25 mg  25 mg Intravenous Q6H PRN Lavina Hamman, MD   25 mg at 06/20/18 1226  . enoxaparin (LOVENOX) injection 40 mg  40 mg Subcutaneous Daily Opyd, Ilene Qua, MD   40 mg at 06/20/18 0919  . feeding supplement (ENSURE ENLIVE) (ENSURE ENLIVE) liquid 237 mL  237 mL Oral BID BM Lavina Hamman, MD   237 mL at 06/20/18 1316  . methadone (DOLOPHINE) tablet 40 mg  40 mg Oral Q12H Lavina Hamman, MD   40 mg at 06/20/18 0919  . ondansetron (ZOFRAN) injection 4 mg  4 mg Intravenous Q6H PRN Lavina Hamman, MD   4 mg at 06/20/18 0813  . prenatal multivitamin tablet 1 tablet  1 tablet Oral Q1200 Opyd, Ilene Qua, MD   1 tablet at 06/20/18 1226  . promethazine (PHENERGAN) injection 12.5-25 mg  12.5-25 mg Intravenous Q6H PRN Lovey Newcomer T, NP   25 mg at 06/20/18 0346  . sodium chloride flush (NS) 0.9 % injection 10-40 mL  10-40 mL Intracatheter Q12H Lavina Hamman, MD   10 mL at 06/19/18 2135  . sodium chloride flush (NS) 0.9 % injection 10-40 mL  10-40 mL Intracatheter PRN  Lavina Hamman, MD      . sodium chloride flush (NS) 0.9 % injection 3 mL  3 mL Intravenous Q12H Opyd, Ilene Qua, MD   3 mL at 06/18/18 2032  . vancomycin (VANCOCIN) 2,000 mg in sodium chloride 0.9 % 500 mL IVPB  2,000 mg Intravenous Q12H Lavenia Atlas, RPH 250 mL/hr at 06/20/18 1034 2,000 mg at 06/20/18 1034     Physical Exam:   Current Vital Signs 24h Vital Sign Ranges  T 98.2 F (36.8 C) Temp  Avg: 98.1 F (36.7 C)  Min: 97.7 F (36.5 C)  Max: 98.9 F (37.2 C)  BP 123/85 BP  Min: 106/71  Max: 134/89  HR 73 Pulse  Avg: 76  Min: 69  Max: 84  RR (!) 25 Resp  Avg: 23.8  Min: 20  Max: 30  SaO2 93 % Room Air SpO2  Avg: 95.5 %  Min: 93 %  Max: 98 %       24 Hour I/O Current Shift I/O  Time Ins Outs 02/06 0701 - 02/07 0700 In: 2774 [P.O.:240] Out: 200 [Urine:200] 02/07 0701 - 02/07 1900 In: 894.9  Out: -    Patient Vitals for the past 24 hrs:  BP Temp Temp src Pulse Resp SpO2  06/20/18 1316 123/85 98.2 F (36.8 C) Oral 73 (!) 25 93 %  06/20/18 1217 125/87 98.2 F (36.8 C) Oral 75 (!) 23 97 %  06/20/18 1115 123/88 98.1 F (36.7 C) Oral - - -  06/20/18 1059 (!) 125/91 98 F (36.7 C) Oral - - -  06/20/18 1046 (!) 125/91 98 F (36.7 C) Oral - - -  06/20/18 0832 119/86 98.1 F (36.7 C) Oral - - -  06/20/18 0815 106/71 97.9 F (36.6 C) Oral - - -  06/20/18 0758 106/71 97.9 F (36.6 C) Oral 78 20 98 %  06/20/18 0332 134/89 97.7 F (36.5 C) Oral 69 20 98 %  06/19/18 2332 123/79 98 F (36.7 C) - 79 (!) 25 93 %  06/19/18 2011 134/89 98.9 F (37.2 C) Oral 84 (!) 30 94 %  06/19/18 1648 (!) 122/91 - - 74 - -    Body mass index is 28.69 kg/m. General appearance: Well nourished, well developed female in no acute distress.  Cardiovascular: S1, S2 normal, no murmur, rub or gallop, regular rate and rhythm Respiratory:  Clear to auscultation bilateral. Normal respiratory effort Abdomen: gravid, soft, nttp Neuro/Psych:  Normal mood and affect.  Skin:  Warm and dry.   Extremities: no clubbing, cyanosis, or edema.    Laboratory:  Recent Labs  Lab 06/18/18 0603 06/19/18 0229 06/20/18 0530 06/20/18 1500  WBC 9.4 8.5 10.4  --   HGB 7.5* 7.3* 6.8* 8.0*  HCT 23.6* 23.3* 22.9* 26.3*  PLT 404* 352 304  --    Recent Labs  Lab 06/18/18 0603 06/19/18 0229 06/20/18 0530  NA 137 137 134*  K 3.7 3.8 3.4*  CL 107 105 102  CO2 22 22 23   BUN 5* 5* 5*  CREATININE 0.56 0.57 0.50  CALCIUM 8.0* 7.9* 7.6*  PROT 6.0* 5.8* 5.5*  BILITOT 0.8 0.7 0.4  ALKPHOS 182* 171* 164*  ALT 10 9 7   AST 18 19 17   GLUCOSE 99 81 99   Recent Labs  Lab 06/14/18 1429  INR 0.99   Recent Labs  Lab 06/17/18 1040  ABORH O POS    Imaging:  No new imaging  Assessment: Ms. Buerger is a 33 y.o. [id number] @ 20/6. Pt stable Plan: *Pregnancy: prenatal vitamin. Will d/w MFM next week to see about doing an anatomy u/s either at [facility] or at [facility] hospital. -patient needs routine OB labs. Please order prenatal panel labs, afp tetra (please note due date in the comments section), cystic fibrosis screening, hemoglobinopathy panel, and [phone]" for SMA screening with patient's next lab draw.  -needs anesthesia consult in early third trimester -d/w pt that I recommend BTL. Pt to consider options. Sign BTL papers if pt desires BTL -Medications reviewed and nothing worrisome during pregnancy. Call for any questions. -Continue with daily fetal dopplers. Switch to non stress tests when at 24wks   Total time taking care of the patient was 30 minutes, with greater than 50% of the time spent in face to face interaction with the patient.  Durene Romans MD Attending Center for [facility])  Attending Phone: [phone]

## 2018-06-20 NOTE — Progress Notes (Signed)
1630 Here to perform doppler FHTs on this 33 yo G6P2 @ 20.[redacted] wks GA. Pt denies vaginal bleeding, LOF, or UCs and reports has felt fetal movement.

## 2018-06-21 LAB — CULTURE, BLOOD (ROUTINE X 2)
Culture: NO GROWTH
Culture: NO GROWTH
Special Requests: ADEQUATE

## 2018-06-21 LAB — CBC WITH DIFFERENTIAL/PLATELET
Abs Immature Granulocytes: 0.34 10*3/uL — ABNORMAL HIGH (ref 0.00–0.07)
Basophils Absolute: 0 10*3/uL (ref 0.0–0.1)
Basophils Relative: 0 %
Eosinophils Absolute: 0.1 10*3/uL (ref 0.0–0.5)
Eosinophils Relative: 0 %
HCT: 26.2 % — ABNORMAL LOW (ref 36.0–46.0)
Hemoglobin: 7.9 g/dL — ABNORMAL LOW (ref 12.0–15.0)
IMMATURE GRANULOCYTES: 3 %
Lymphocytes Relative: 17 %
Lymphs Abs: 1.9 10*3/uL (ref 0.7–4.0)
MCH: 27.6 pg (ref 26.0–34.0)
MCHC: 30.2 g/dL (ref 30.0–36.0)
MCV: 91.6 fL (ref 80.0–100.0)
Monocytes Absolute: 0.9 10*3/uL (ref 0.1–1.0)
Monocytes Relative: 8 %
Neutro Abs: 8 10*3/uL — ABNORMAL HIGH (ref 1.7–7.7)
Neutrophils Relative %: 72 %
Platelets: 307 10*3/uL (ref 150–400)
RBC: 2.86 MIL/uL — ABNORMAL LOW (ref 3.87–5.11)
RDW: 15.9 % — ABNORMAL HIGH (ref 11.5–15.5)
WBC: 11.2 10*3/uL — ABNORMAL HIGH (ref 4.0–10.5)
nRBC: 0 % (ref 0.0–0.2)

## 2018-06-21 LAB — BPAM RBC
Blood Product Expiration Date: 202003062359
Blood Product Expiration Date: 202003092359
Blood Product Expiration Date: 202003092359
ISSUE DATE / TIME: 202002070808
ISSUE DATE / TIME: 202002041223
ISSUE DATE / TIME: 202002071056
UNIT TYPE AND RH: 5100
Unit Type and Rh: 5100
Unit Type and Rh: 5100

## 2018-06-21 LAB — TYPE AND SCREEN
ABO/RH(D): O POS
ANTIBODY SCREEN: NEGATIVE
Unit division: 0
Unit division: 0
Unit division: 0

## 2018-06-21 LAB — RPR: RPR Ser Ql: NONREACTIVE

## 2018-06-21 LAB — CREATININE, SERUM
Creatinine, Ser: 0.46 mg/dL (ref 0.44–1.00)
GFR calc Af Amer: >60 mL/min (ref >60)

## 2018-06-21 LAB — HIV ANTIBODY (ROUTINE TESTING W REFLEX): HIV SCREEN 4TH GENERATION: NONREACTIVE

## 2018-06-21 LAB — GLUCOSE, CAPILLARY: Glucose-Capillary: 85 mg/dL (ref 70–99)

## 2018-06-21 NOTE — Progress Notes (Signed)
Spoke with Dr. Rosana Hoes. FHr 159 BPM by doppler. Denies vaginal bleeding or leaking of fluid.

## 2018-06-21 NOTE — Progress Notes (Signed)
PROGRESS NOTE  Kelly [name] RSW:[id number] DOB: 1985-05-18 DOA: 06/14/2018 PCP: [facility]  Brief History   Pt. 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  Patient is [redacted] weeks pregnant.   OB Gyn has consulted and has recommended a number of prenatal and screening labs which have been ordered.  Currently further plan is continue current antibiotics and supportive measures.  A & P  Sepsis secondary to multifocal pneumonia and streptococcal/enterococcal bacteremia, and Influenza B: Sepsis resolving. Pt presented with cough, malaise, hypotension, and tachycardia, leukocytosis, and elevated lactic acid. ID has been consulted and the patient has been transitioned to IV Rocephin, ampicillin, and vancomycin. The patient is also receiving Tamiflu once pharmacy vouched for its safety in pregnancy.  Tricuspid valve endocarditis with vegetation size 13m x 231m CTS has been consulted. 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 Echocardiogram shows worsening vegetation 10 mm x 20 mm which was 8 mm x 9 mm last time. 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 MRI of the lumbar spine was negative for any acute abnormality at this time. Cardiothoracic surgery consulted, currently not a surgical candidate. OBGyn recommend continue current management.  IV heroin abuse. History of polysubstance abuse: Patient reports that she was on chronic methadone therapy at 100 mg daily. She stopped going to the methadone clinic in December 2019. [facility] the patient's last methadone dose was 70 mg in  03/2018. She is no longer a patient there.Patient reports that she has been using 5m31migrams of IV heroin.She is requesting to go to rehabilitation program on discharge.OB/GYN attending on call. They have recommended that the patient undergo BTL after baby delivered. Knowing that physiologically there will be demand for frequent dosing I will increase her last documented clinic dose of 70 mg to 80 mg and divide her 80 mg methadone dose to 40 mg BID. There will be no further dose increase. Monitor QTC while on methadone.  Currently stable.  Continue Phenergan for nausea and Zofran for refractory nausea. Benadryl for itching as well as other withdrawal symptoms. Patient reports that she has allergic reaction to Suboxone although on further questioning she mentions that she started having withdrawal symptoms while she was on Suboxone therapy and never had any rash or throat swelling that required hospitalization. Patient was clearly informed that there is no way that we can provide methadone on discharge prescription and therefore she has to contact her methadone clinic prior to discharge. Discussed the case with the social worker, they provided the patient information regarding [facility] program for mothers. When close to discharge, [facility] for the patient.  20 weeks IUP by ultrasound: OB GYN has been consutled. The fetal heart rate is being monitored daily by OB [facility]. For any OB related complaints contact OB/GYN attending on call. I appreciate OB/Gyn's assistance.  Elevated LFT: Monitor. No abdominal pain. Likely reactive in response to acute infection. Trending down. Does not appear to be having like helLp syndrome.  Physiological anemia of pregnancy: A bit worse due to dilution. The patient is  receiving 2 units PRBC's in transfusion today.   Diet: Regular diet DVT Prophylaxis:  subcutaneous Heparin Advance goals of care discussion: full code Family Communication: Emergency contact is [name] ([place]) [phone]  Disposition: Discharge to be determined.  Will likely need prolonged antibiotic course which needs to be completed here in the hospital.   Karie Kirks, DO [facility] Direct contact: see [email or web address]  7PM-7AM contact night coverage as above 06/21/2018, 3:51 PM  LOS: 6 days   Consultants  . Cardiothoracic surgery, Infectious Disease, and OB/Gyn  Interval History/Subjective  The patient is awake, alert, and oriented x 3. No acute distress.  Objective   Vitals:  Vitals:   06/21/18 0735 06/21/18 1131  BP: 121/88 115/83  Pulse: 78 74  Resp: (!) 21 17  Temp: 97.8 F (36.6 C) 97.9 F (36.6 C)  SpO2: 96% 95%    Exam:  Constitutional:  . The patient is awake, alert, and oriented x 3. No acute distress. Respiratory:  . No wheezes, rales, or rhonchi. . No increased work of breathing.  . No tactile fremitus. Cardiovascular:  . Regular rate and rhythm.RR, no murmurs, ectopy, or gallups. . No LE extremity edema   . Normal pedal pulses Abdomen:  . Abdomen gravid. Non-tender, non-distended. . No hernias, massess or organomegaly can be appreciated. . Normoactive bowel sounds. Musculoskeletal:  . Digits/nails BUE: no clubbing, cyanosis, petechiae, infection . exam of joints, bones, muscles of at least one of following: head/neck, RUE, LUE, RLE, LLE   o strength and tone normal, no atrophy, no abnormal movements o No tenderness, masses o Normal ROM, no contractures  Skin:  . No rashes, lesions, ulcers . palpation of skin: no induration or nodules Neurologic:  . CN 2-12 intact . Sensation all 4 extremities intact Psychiatric:  . Mental status o Mood, affect appropriate o Orientation to person, place, time  . judgment and insight appear intact   I have seen and examined this patient myself. I have spent 32 minutes in her  evaluation and care. I have personally reviewed the following:   Today's Data  . Laboratory and vitals   Scheduled Meds: . enoxaparin (LOVENOX) injection  40 mg Subcutaneous Daily  . feeding supplement (ENSURE ENLIVE)  237 mL Oral BID BM  . methadone  40 mg Oral Q12H  . prenatal multivitamin  1 tablet Oral Q1200  . sodium chloride flush  10-40 mL Intracatheter Q12H  . sodium chloride flush  3 mL Intravenous Q12H   Continuous Infusions: . ampicillin (OMNIPEN) IV 2 g (06/21/18 1236)  . cefTRIAXone (ROCEPHIN)  IV 2 g (06/21/18 0848)  . vancomycin 2,000 mg (06/21/18 1006)    Principal Problem:   Bacteremia due to Enterococcus Active Problems:   Opioid use disorder, severe, dependence (HCC)   Sepsis due to pneumonia ([place])   IV drug abuse complicating pregnancy ([place])   Multifocal pneumonia   Hyponatremia   Elevated transaminase level   Degenerative arthritis of lumbar spine   History of bacterial endocarditis   Severe tricuspid regurgitation by prior echocardiogram   Influenza B   LOS: 6 days

## 2018-06-22 ENCOUNTER — Inpatient Hospital Stay (HOSPITAL_COMMUNITY): Payer: Medicaid Other

## 2018-06-22 LAB — CBC WITH DIFFERENTIAL/PLATELET
Abs Immature Granulocytes: 0.24 10*3/uL — ABNORMAL HIGH (ref 0.00–0.07)
Basophils Absolute: 0 10*3/uL (ref 0.0–0.1)
Basophils Relative: 0 %
Eosinophils Absolute: 0 10*3/uL (ref 0.0–0.5)
Eosinophils Relative: 0 %
HCT: 25.2 % — ABNORMAL LOW (ref 36.0–46.0)
Hemoglobin: 7.6 g/dL — ABNORMAL LOW (ref 12.0–15.0)
Immature Granulocytes: 2 %
Lymphocytes Relative: 12 %
Lymphs Abs: 1.7 10*3/uL (ref 0.7–4.0)
MCH: 27.7 pg (ref 26.0–34.0)
MCHC: 30.2 g/dL (ref 30.0–36.0)
MCV: 92 fL (ref 80.0–100.0)
MONO ABS: 0.8 10*3/uL (ref 0.1–1.0)
Monocytes Relative: 6 %
Neutro Abs: 10.7 10*3/uL — ABNORMAL HIGH (ref 1.7–7.7)
Neutrophils Relative %: 80 %
Platelets: 291 10*3/uL (ref 150–400)
RBC: 2.74 MIL/uL — ABNORMAL LOW (ref 3.87–5.11)
RDW: 16.3 % — ABNORMAL HIGH (ref 11.5–15.5)
WBC: 13.5 10*3/uL — ABNORMAL HIGH (ref 4.0–10.5)
nRBC: 0 % (ref 0.0–0.2)

## 2018-06-22 LAB — VANCOMYCIN, TROUGH: Vancomycin Tr: 9 ug/mL — ABNORMAL LOW (ref 15–20)

## 2018-06-22 LAB — VANCOMYCIN, PEAK: Vancomycin Pk: 34 ug/mL (ref 30–40)

## 2018-06-22 MED ORDER — ALBUTEROL SULFATE (2.5 MG/3ML) 0.083% IN NEBU
2.5000 mg | INHALATION_SOLUTION | RESPIRATORY_TRACT | Status: DC | PRN
  Administered 2018-06-22 – 2018-06-24 (×4): 2.5 mg via RESPIRATORY_TRACT
  Filled 2018-06-22 (×4): qty 3

## 2018-06-22 MED ORDER — VANCOMYCIN HCL 10 G IV SOLR
1250.0000 mg | Freq: Three times a day (TID) | INTRAVENOUS | Status: DC
  Administered 2018-06-23 – 2018-06-25 (×9): 1250 mg via INTRAVENOUS
  Filled 2018-06-22 (×11): qty 1250

## 2018-06-22 MED ORDER — SALINE SPRAY 0.65 % NA SOLN
2.0000 | NASAL | Status: DC | PRN
  Filled 2018-06-22: qty 44

## 2018-06-22 NOTE — Progress Notes (Signed)
PROGRESS NOTE  Kelly [name] QVZ:[id number] DOB: 1986/05/01 DOA: 06/14/2018 PCP: [facility]  Brief History   Pt. 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  Patient is [redacted] weeks pregnant.   OB Gyn has consulted and has recommended a number of prenatal and screening labs which have been ordered.  Currently further plan is continue current antibiotics and supportive measures.  A & P  Sepsis secondary to multifocal pneumonia and streptococcal/enterococcal bacteremia, and Influenza B: Sepsis resolving. Pt presented with cough, malaise, hypotension, and tachycardia, leukocytosis, and elevated lactic acid. ID has been consulted and the patient has been transitioned to IV Rocephin, ampicillin, and vancomycin. The patient is also receiving Tamiflu once pharmacy vouched for its safety in pregnancy.  Acute respiratory failure: The patient is saturating at 100% on room air. She is complaining of feeling short of breath. Likely due to effect of gravid abdomen on diaphragm.   Myalgias: Pt is complaining of pain all over. She is receiving 80 mg of methadone daily.  Tricuspid valve endocarditis with vegetation size 75m x 27100m CTS has been consulted. 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 Echocardiogram shows worsening vegetation 10 mm x 20 mm which was 8 mm x 9 mm last time. 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 MRI of the lumbar spine was negative for any acute abnormality at this time. Cardiothoracic surgery consulted, currently not a surgical candidate. OBGyn recommend continue current management.  IV heroin abuse. History of  polysubstance abuse: Patient reports that she was on chronic methadone therapy at 100 mg daily. She stopped going to the methadone clinic in December 2019. [facility] the patient's last methadone dose was 70 mg in 03/2018. She is no longer a patient there.Patient reports that she has been using 100m69migrams of IV heroin.She is requesting to go to rehabilitation program on discharge.OB/GYN attending on call. They have recommended that the patient undergo BTL after baby delivered. Knowing that physiologically there will be demand for frequent dosing I will increase her last documented clinic dose of 70 mg to 80 mg and divide her 80 mg methadone dose to 40 mg BID. There will be no further dose increase. Monitor QTC while on methadone.  Currently stable.  Continue Phenergan for nausea and Zofran for refractory nausea. Benadryl for itching as well as other withdrawal symptoms. Patient reports that she has allergic reaction to Suboxone although on further questioning she mentions that she started having withdrawal symptoms while she was on Suboxone therapy and never had any rash or throat swelling that required hospitalization. Patient was clearly informed that there is no way that we can provide methadone on discharge prescription and therefore she has to contact her methadone clinic prior to discharge. Discussed the case with the social worker, they provided the patient information regarding [facility] program for mothers. When close to discharge, [facility] for the patient.  20 weeks IUP by ultrasound: OB GYN has been consutled. The fetal heart rate is being monitored daily by OB [facility]. For any OB related complaints  contact OB/GYN attending on call. I appreciate OB/Gyn's assistance.  Elevated LFT: Monitor. No abdominal pain. Likely reactive in response to  acute infection. Trending down. Does not appear to be having like helLp syndrome.  Physiological anemia of pregnancy: A bit worse due to dilution. The patient is receiving 2 units PRBC's in transfusion today.   Diet: Regular diet DVT Prophylaxis: subcutaneous Heparin Advance goals of care discussion: full code Family Communication: Emergency contact is [name] ([place]) [phone]  Disposition: Discharge to be determined.  Will likely need prolonged antibiotic course which needs to be completed here in the hospital.   Karie Kirks, DO [facility] Direct contact: see [email or web address]  7PM-7AM contact night coverage as above 06/22/2018, 1:37 PM  LOS: 7 days   Consultants  . Cardiothoracic surgery, Infectious Disease, and OB/Gyn  Interval History/Subjective  The patient is awake, alert, and oriented x 3. No acute distress.  Objective   Vitals:  Vitals:   06/22/18 0300 06/22/18 0759  BP: 122/76 (!) 137/97  Pulse: 81 86  Resp: (!) 22 20  Temp: (!) 97.5 F (36.4 C) 97.6 F (36.4 C)  SpO2: 97% 99%    Exam:  Constitutional:  . The patient is awake, alert, and oriented x 3. No acute distress. Respiratory:  . No wheezes, rales, or rhonchi. . No increased work of breathing.  . No tactile fremitus. Cardiovascular:  . Regular rate and rhythm.RR, no murmurs, ectopy, or gallups. . No LE extremity edema   . Normal pedal pulses Abdomen:  . Abdomen gravid. Non-tender, non-distended. . No hernias, massess or organomegaly can be appreciated. . Normoactive bowel sounds. Musculoskeletal:  . Digits/nails BUE: no clubbing, cyanosis, petechiae, infection . exam of joints, bones, muscles of at least one of following: head/neck, RUE, LUE, RLE, LLE   o strength and tone normal, no atrophy, no abnormal movements o No tenderness, masses o Normal ROM, no contractures  Skin:  . No rashes, lesions, ulcers . palpation of skin: no induration or nodules Neurologic:  . CN 2-12  intact . Sensation all 4 extremities intact Psychiatric:  . Mental status o Mood, affect appropriate o Orientation to person, place, time  . judgment and insight appear intact   I have seen and examined this patient myself. I have spent 32 minutes in her evaluation and care. I have personally reviewed the following:   Today's Data  . Laboratory and vitals   Scheduled Meds: . enoxaparin (LOVENOX) injection  40 mg Subcutaneous Daily  . feeding supplement (ENSURE ENLIVE)  237 mL Oral BID BM  . methadone  40 mg Oral Q12H  . prenatal multivitamin  1 tablet Oral Q1200  . sodium chloride flush  10-40 mL Intracatheter Q12H  . sodium chloride flush  3 mL Intravenous Q12H   Continuous Infusions: . ampicillin (OMNIPEN) IV 2 g (06/22/18 0817)  . cefTRIAXone (ROCEPHIN)  IV 2 g (06/22/18 1032)  . vancomycin 2,000 mg (06/22/18 1145)    Principal Problem:   Bacteremia due to Enterococcus Active Problems:   Opioid use disorder, severe, dependence (HCC)   Sepsis due to pneumonia ([place])   IV drug abuse complicating pregnancy ([place])   Multifocal pneumonia   Hyponatremia   Elevated transaminase level   Degenerative arthritis of lumbar spine   History of bacterial endocarditis   Severe tricuspid regurgitation by prior echocardiogram   Influenza B   LOS: 7 days

## 2018-06-22 NOTE — Progress Notes (Signed)
Spoke with OB faculty on call for change in OB US order. VO to change to limited, communicated with [place] to have [place] changed. Will complete order this shift.

## 2018-06-22 NOTE — Progress Notes (Signed)
FHR 161 BPM by doppler. Pt denies vaginal bleeding, leaking of fluid, uc's, or abd cramping.

## 2018-06-22 NOTE — Progress Notes (Addendum)
Pharmacy Antibiotic Note  Kelly [name] is a 33 y.o. female admitted on 06/14/2018 with strep and enterococcal endocarditis.  Pharmacy has been consulted for vancomycin dosing. Patient has been on ampicillin and ceftriaxone for the enterococcus isolated in her bloodstream but unfortunately her strep sanguinis species has come back as resistant to ceftriaxone and penicillin, so vancomycin was added.   Vancomycin pk and trough of 34 mcg/ml and 9 mcg ml (7 hours apart) were obtained on dose of 2019m IV q12h. Calculated Ke 0.2185, half-life 3.2 hrs and AUC 597 supratherapeutic (goal AUC 400-550)  Plan: Change Vancomycin to 1250 mg every 8 hours for expected AUC of 556 and Cmin 10.2 Monitor renal function and collect peak and trough at steady state on new dose  Height: 5' 7"  (170.2 cm) Weight: 194 lb 3.6 oz (88.1 kg) IBW/kg (Calculated) : 61.6  Temp (24hrs), Avg:97.9 F (36.6 C), Min:97.5 F (36.4 C), Max:98.2 F (36.8 C)  Recent Labs  Lab 06/17/18 0721  06/18/18 0603 06/19/18 0229 06/19/18 1630 06/19/18 2130 06/20/18 0530 06/20/18 2044 06/21/18 0500 06/22/18 0500 06/22/18 1607 06/22/18 2212  WBC 7.2   < > 9.4 8.5  --   --  10.4 11.1* 11.2* 13.5*  --   --   CREATININE 0.50  --  0.56 0.57  --   --  0.50  --  0.46  --   --   --   VANCOTROUGH  --   --   --   --   --  11*  --   --   --   --   --  9*  VANCOPEAK  --   --   --   --  19*  --   --   --   --   --  34  --    < > = values in this interval not displayed.    Estimated Creatinine Clearance: 114 mL/min (by C-G formula based on SCr of 0.46 mg/dL).    Allergies  Allergen Reactions  . Hydrocodone Itching  . Morphine And Related Nausea And Vomiting    Antimicrobials this admission: Vancomcyin 2/1 >> 2/2, 2/5>>> Azithromycin 2/1 >> 2/2 Ceftriaxone 2/1>> Ampicillin 2/4>>  Dose adjustments: Levels 19/11 (5 hours apart) -  AUC low at 389 on this dose >> dose adjusted to 2g/12h for estimated AUC 517  Microbiology  results: 2/1 BCx: enterococcus faecalis (S-amp) , strep sanguinis in 1/2 (R-CTX, PCN S- Vanc) and strep mitis in 1/2  2/1 UCx: E coli  Thank you for allowing pharmacy to be a part of this patient's care.  CSherlon Handing PharmD, BCPS Clinical pharmacist  **Pharmacist phone directory can now be found on a[place]ontal[place] Manorcom (PW [email or web address]).  Listed under [place] 06/22/2018 11:28 PM

## 2018-06-23 DIAGNOSIS — O9989 Other specified diseases and conditions complicating pregnancy, childbirth and the puerperium: Secondary | ICD-10-CM

## 2018-06-23 DIAGNOSIS — O09899 Supervision of other high risk pregnancies, unspecified trimester: Secondary | ICD-10-CM

## 2018-06-23 DIAGNOSIS — Z283 Underimmunization status: Secondary | ICD-10-CM

## 2018-06-23 LAB — CBC WITH DIFFERENTIAL/PLATELET
ABS IMMATURE GRANULOCYTES: 0.18 10*3/uL — AB (ref 0.00–0.07)
Basophils Absolute: 0 10*3/uL (ref 0.0–0.1)
Basophils Relative: 0 %
Eosinophils Absolute: 0 10*3/uL (ref 0.0–0.5)
Eosinophils Relative: 0 %
HCT: 23.8 % — ABNORMAL LOW (ref 36.0–46.0)
Hemoglobin: 7.4 g/dL — ABNORMAL LOW (ref 12.0–15.0)
Immature Granulocytes: 1 %
Lymphocytes Relative: 12 %
Lymphs Abs: 1.7 10*3/uL (ref 0.7–4.0)
MCH: 28.5 pg (ref 26.0–34.0)
MCHC: 31.1 g/dL (ref 30.0–36.0)
MCV: 91.5 fL (ref 80.0–100.0)
Monocytes Absolute: 0.9 10*3/uL (ref 0.1–1.0)
Monocytes Relative: 6 %
Neutro Abs: 12.1 10*3/uL — ABNORMAL HIGH (ref 1.7–7.7)
Neutrophils Relative %: 81 %
PLATELETS: 306 10*3/uL (ref 150–400)
RBC: 2.6 MIL/uL — ABNORMAL LOW (ref 3.87–5.11)
RDW: 16.5 % — ABNORMAL HIGH (ref 11.5–15.5)
WBC: 15 10*3/uL — ABNORMAL HIGH (ref 4.0–10.5)
nRBC: 0 % (ref 0.0–0.2)

## 2018-06-23 LAB — COMPREHENSIVE METABOLIC PANEL
ALT: 6 U/L (ref 0–44)
AST: 14 U/L — ABNORMAL LOW (ref 15–41)
Albumin: 1.6 g/dL — ABNORMAL LOW (ref 3.5–5.0)
Alkaline Phosphatase: 181 U/L — ABNORMAL HIGH (ref 38–126)
Anion gap: 10 (ref 5–15)
BILIRUBIN TOTAL: 1.1 mg/dL (ref 0.3–1.2)
BUN: <5 mg/dL — ABNORMAL LOW (ref 6–20)
CO2: 26 mmol/L (ref 22–32)
Calcium: 8.1 mg/dL — ABNORMAL LOW (ref 8.9–10.3)
Chloride: 98 mmol/L (ref 98–111)
Creatinine, Ser: 0.46 mg/dL (ref 0.44–1.00)
GFR calc Af Amer: >60 mL/min (ref >60)
GFR calc non Af Amer: >60 mL/min (ref >60)
Glucose, Bld: 87 mg/dL (ref 70–99)
Potassium: 4.3 mmol/L (ref 3.5–5.1)
Sodium: 134 mmol/L — ABNORMAL LOW (ref 135–145)
Total Protein: 6.5 g/dL (ref 6.5–8.1)

## 2018-06-23 LAB — PROTEIN / CREATININE RATIO, URINE
Creatinine, Urine: 49.38 mg/dL
Protein Creatinine Ratio: 0.59 mg/mg{Cre} — ABNORMAL HIGH (ref 0.00–0.15)
Total Protein, Urine: 29 mg/dL

## 2018-06-23 LAB — HEMOGLOBINOPATHY EVALUATION
Hgb A2 Quant: 2.3 % (ref 1.8–3.2)
Hgb A: 97.7 % (ref 96.4–98.8)
Hgb C: 0 %
Hgb F Quant: 0 % (ref 0.0–2.0)
Hgb S Quant: 0 %
Hgb Variant: 0 %

## 2018-06-23 LAB — RUBELLA SCREEN

## 2018-06-23 LAB — GLUCOSE, CAPILLARY: Glucose-Capillary: 104 mg/dL — ABNORMAL HIGH (ref 70–99)

## 2018-06-23 NOTE — Progress Notes (Signed)
PHARMACY CONSULT NOTE FOR:   OUTPATIENT  PARENTERAL ANTIBIOTIC THERAPY (OPAT)  Indication: TV endocarditis, bacteremia with Enterococcus, S mitis and S sanguis Regimen:  Rocephin 2gm IV q12h, Ampicillin 12gm q24h (continuous infusion), Vancomycin 1254m IV q8h End date: 08/03/18  IV antibiotic discharge orders are pended. To discharging provider:  please sign these orders via discharge navigator,  Select New Orders & click on the button choice - Manage This Unsigned Work.     Thank you for allowing pharmacy to be a part of this patient's care.  CSherlon Handing PharmD, BCPS Clinical pharmacist  **Pharmacist phone directory can now be found on a[place]com (PW [email or web address]).  Listed under [place] 06/23/2018, 11:59 PM

## 2018-06-23 NOTE — Progress Notes (Addendum)
Diagnosis: TV endocarditis, bacteremia with Enterococcus, S mitis and S sanguis  Culture Result: see above  Allergies  Allergen Reactions  . Hydrocodone Itching  . Morphine And Related Nausea And Vomiting    OPAT Orders Discharge antibiotics: Ceftriaxone 2 grams IV q 12 hours, AMP 12 grams IV q 24 hours and vancomycin  Per pharmacy protocol  Aim for Vancomycin trough 15-20 (unless otherwise indicated) Duration: 6 weekjs  End Date: March 22nd, 2020  [place] Per Protocol: BIWEEKLY LABS while on IV abx:  _x_ BMP w GFR  Labs weekly while on IV antibiotics: _x_ CBC with differential  _x_ Vancomycin trough   x__ Please pull PIC at completion of IV antibiotics __ Please leave PIC in place until doctor has seen patient or been notified  Fax weekly labs to [phone]  Clinic Follow Up Appt:  08/05/2018 at 1015 am with Dr Tommy Medal

## 2018-06-23 NOTE — Progress Notes (Signed)
Subjective: No new complaints   Antibiotics:  Anti-infectives (From admission, onward)   Start     Dose/Rate Route Frequency Ordered Stop   06/23/18 0000  vancomycin (VANCOCIN) 1,250 mg in sodium chloride 0.9 % 250 mL IVPB     1,250 mg 166.7 mL/hr over 90 Minutes Intravenous Every 8 hours 06/22/18 2332     06/20/18 1000  vancomycin (VANCOCIN) 2,000 mg in sodium chloride 0.9 % 500 mL IVPB  Status:  Discontinued     2,000 mg 250 mL/hr over 120 Minutes Intravenous Every 12 hours 06/19/18 2221 06/22/18 2332   06/18/18 2000  vancomycin (VANCOCIN) IVPB 1000 mg/200 mL premix  Status:  Discontinued     1,000 mg 200 mL/hr over 60 Minutes Intravenous Every 8 hours 06/18/18 1054 06/19/18 2221   06/18/18 1200  vancomycin (VANCOCIN) 1,750 mg in sodium chloride 0.9 % 500 mL IVPB     1,750 mg 250 mL/hr over 120 Minutes Intravenous  Once 06/18/18 1054 06/18/18 2241   06/17/18 2200  cefTRIAXone (ROCEPHIN) 2 g in sodium chloride 0.9 % 100 mL IVPB     2 g 200 mL/hr over 30 Minutes Intravenous Every 12 hours 06/17/18 1046     06/17/18 1200  ampicillin (OMNIPEN) 2 g in sodium chloride 0.9 % 100 mL IVPB     2 g 300 mL/hr over 20 Minutes Intravenous Every 4 hours 06/17/18 1045     06/16/18 1300  oseltamivir (TAMIFLU) capsule 75 mg     75 mg Oral 2 times daily 06/16/18 1231 06/20/18 0919   06/15/18 0700  vancomycin (VANCOCIN) IVPB 1000 mg/200 mL premix  Status:  Discontinued     1,000 mg 200 mL/hr over 60 Minutes Intravenous Every 8 hours 06/14/18 2216 06/15/18 0711   06/15/18 0500  cefTRIAXone (ROCEPHIN) 2 g in sodium chloride 0.9 % 100 mL IVPB  Status:  Discontinued     2 g 200 mL/hr over 30 Minutes Intravenous Every 24 hours 06/15/18 0445 06/17/18 1046   06/14/18 2200  azithromycin (ZITHROMAX) 500 mg in sodium chloride 0.9 % 250 mL IVPB  Status:  Discontinued     500 mg 250 mL/hr over 60 Minutes Intravenous Every 24 hours 06/14/18 2134 06/15/18 0711   06/14/18 2200  cefTRIAXone  (ROCEPHIN) 1 g in sodium chloride 0.9 % 100 mL IVPB  Status:  Discontinued     1 g 200 mL/hr over 30 Minutes Intravenous Every 24 hours 06/14/18 2134 06/15/18 0445   06/14/18 2200  vancomycin (VANCOCIN) 1,500 mg in sodium chloride 0.9 % 500 mL IVPB     1,500 mg 250 mL/hr over 120 Minutes Intravenous  Once 06/14/18 2134 06/15/18 0235      Medications: Scheduled Meds: . enoxaparin (LOVENOX) injection  40 mg Subcutaneous Daily  . feeding supplement (ENSURE ENLIVE)  237 mL Oral BID BM  . methadone  40 mg Oral Q12H  . prenatal multivitamin  1 tablet Oral Q1200  . sodium chloride flush  10-40 mL Intracatheter Q12H  . sodium chloride flush  3 mL Intravenous Q12H   Continuous Infusions: . ampicillin (OMNIPEN) IV 2 g (06/23/18 1236)  . cefTRIAXone (ROCEPHIN)  IV 2 g (06/23/18 1045)  . vancomycin 1,250 mg (06/23/18 0832)   PRN Meds:.acetaminophen **OR** acetaminophen, albuterol, benzonatate, diphenhydrAMINE, ondansetron (ZOFRAN) IV, promethazine, sodium chloride, sodium chloride flush    Objective: Weight change:   Intake/Output Summary (Last 24 hours) at 06/23/2018 1329 Last data filed at 06/23/2018 1046 Gross per  24 hour  Intake 10 ml  Output 400 ml  Net -390 ml   Blood pressure (!) 120/91, pulse 82, temperature 98 F (36.7 C), temperature source Oral, resp. rate 20, height 5' 7"  (1.702 m), weight 88.1 kg, last menstrual period 12/30/2017, SpO2 96 %, unknown if currently breastfeeding. Temp:  [97.5 F (36.4 C)-98.3 F (36.8 C)] 98 F (36.7 C) (02/10 1133) Pulse Rate:  [82-87] 82 (02/10 1133) Resp:  [20-25] 20 (02/10 1133) BP: (119-139)/(81-98) 120/91 (02/10 1133) SpO2:  [96 %-100 %] 96 % (02/10 1133)  Physical Exam: General: Alert and awake, oriented x3, not in any acute distress. HEENT: anicteric sclera, EOMI CVS regular rate, normal  Chest: , no wheezing, no respiratory distress Abdomen: soft non-distended,  Extremities: no edema or deformity noted bilaterally Skin: no  rashes Neuro: nonfocal  CBC:    BMET Recent Labs    06/21/18 0500  CREATININE 0.46     Liver Panel  No results for input(s): PROT, ALBUMIN, AST, ALT, ALKPHOS, BILITOT, BILIDIR, IBILI in the last 72 hours.     Sedimentation Rate No results for input(s): ESRSEDRATE in the last 72 hours. C-Reactive Protein No results for input(s): CRP in the last 72 hours.  Micro Results: Recent Results (from the past 720 hour(s))  Culture, blood (Routine x 2)     Status: Abnormal   Collection Time: 06/14/18  2:30 PM  Result Value Ref Range Status   Specimen Description BLOOD RIGHT ANTECUBITAL  Final   Special Requests   Final    BOTTLES DRAWN AEROBIC AND ANAEROBIC Blood Culture adequate volume   Culture  Setup Time   Final    GRAM POSITIVE COCCI IN CHAINS IN BOTH AEROBIC AND ANAEROBIC BOTTLES CRITICAL VALUE NOTED.  VALUE IS CONSISTENT WITH PREVIOUSLY REPORTED AND CALLED VALUE. Performed at [facility], [place] [address], [place], [place] [address]    Culture (A)  Final    STREPTOCOCCUS MITIS/ORALIS CORRECTED ON 02/06 AT 4696: PREVIOUSLY REPORTED AS VIRIDANS STREPTOCOCCUS ENTEROCOCCUS FAECALIS    Report Status 06/19/2018 FINAL  Final   Organism ID, Bacteria STREPTOCOCCUS MITIS/ORALIS  Final      Susceptibility   Streptococcus mitis/oralis - MIC*    TETRACYCLINE >=16 RESISTANT Resistant     VANCOMYCIN 0.5 SENSITIVE Sensitive     CLINDAMYCIN <=0.25 SENSITIVE Sensitive     CEFTRIAXONE Value in next row Sensitive      SENSITIVE0.5    PENICILLIN Value in next row Intermediate      INTERMEDIATE0.25    * STREPTOCOCCUS MITIS/ORALIS CORRECTED ON 02/06 AT 2952: PREVIOUSLY REPORTED AS VIRIDANS STREPTOCOCCUS  Culture, blood (Routine x 2)     Status: Abnormal   Collection Time: 06/14/18  2:35 PM  Result Value Ref Range Status   Specimen Description BLOOD RIGHT ANTECUBITAL  Final   Special Requests   Final    BOTTLES DRAWN AEROBIC AND ANAEROBIC Blood Culture results may not be optimal  due to an excessive volume of blood received in culture bottles   Culture  Setup Time   Final    GRAM POSITIVE COCCI IN CHAINS IN BOTH AEROBIC AND ANAEROBIC BOTTLES CRITICAL RESULT CALLED TO, READ BACK BY AND VERIFIED WITH: Serita Grammes [id number] 06/15/2018 Mena Goes Performed at [facility], [place] [address] [place], [place] [address]    Culture STREPTOCOCCUS SANGUINIS ENTEROCOCCUS FAECALIS  (A)  Final   Report Status 06/18/2018 FINAL  Final   Organism ID, Bacteria STREPTOCOCCUS SANGUINIS  Final   Organism ID,  Bacteria ENTEROCOCCUS FAECALIS  Final      Susceptibility   Streptococcus sanguinis - MIC*    PENICILLIN 4 RESISTANT Resistant     CEFTRIAXONE >=8 RESISTANT Resistant     ERYTHROMYCIN 2 RESISTANT Resistant     LEVOFLOXACIN 1 SENSITIVE Sensitive     VANCOMYCIN 1 SENSITIVE Sensitive     * STREPTOCOCCUS SANGUINIS   Enterococcus faecalis - MIC*    AMPICILLIN <=2 SENSITIVE Sensitive     VANCOMYCIN 1 SENSITIVE Sensitive     GENTAMICIN SYNERGY SENSITIVE Sensitive     * ENTEROCOCCUS FAECALIS  Blood Culture ID Panel (Reflexed)     Status: Abnormal   Collection Time: 06/14/18  2:35 PM  Result Value Ref Range Status   Enterococcus species NOT DETECTED NOT DETECTED Final   Listeria monocytogenes NOT DETECTED NOT DETECTED Final   Staphylococcus species NOT DETECTED NOT DETECTED Final   Staphylococcus aureus (BCID) NOT DETECTED NOT DETECTED Final   Streptococcus species DETECTED (A) NOT DETECTED Final    Comment: Not Enterococcus species, Streptococcus agalactiae, Streptococcus pyogenes, or Streptococcus pneumoniae. CRITICAL RESULT CALLED TO, READ BACK BY AND VERIFIED WITH: J. LEDFORD,PHARMD [id number] 06/15/2018 T. TYSOR    Streptococcus agalactiae NOT DETECTED NOT DETECTED Final   Streptococcus pneumoniae NOT DETECTED NOT DETECTED Final   Streptococcus pyogenes NOT DETECTED NOT DETECTED Final   Acinetobacter baumannii NOT DETECTED NOT DETECTED Final   Enterobacteriaceae species  NOT DETECTED NOT DETECTED Final   Enterobacter cloacae complex NOT DETECTED NOT DETECTED Final   Escherichia coli NOT DETECTED NOT DETECTED Final   Klebsiella oxytoca NOT DETECTED NOT DETECTED Final   Klebsiella pneumoniae NOT DETECTED NOT DETECTED Final   Proteus species NOT DETECTED NOT DETECTED Final   Serratia marcescens NOT DETECTED NOT DETECTED Final   Haemophilus influenzae NOT DETECTED NOT DETECTED Final   Neisseria meningitidis NOT DETECTED NOT DETECTED Final   Pseudomonas aeruginosa NOT DETECTED NOT DETECTED Final   Candida albicans NOT DETECTED NOT DETECTED Final   Candida glabrata NOT DETECTED NOT DETECTED Final   Candida krusei NOT DETECTED NOT DETECTED Final   Candida parapsilosis NOT DETECTED NOT DETECTED Final   Candida tropicalis NOT DETECTED NOT DETECTED Final    Comment: Performed at [facility], [place] [address], [place], [place] [address]  Urine Culture     Status: Abnormal   Collection Time: 06/14/18 10:03 PM  Result Value Ref Range Status   Specimen Description URINE, RANDOM  Final   Special Requests   Final    NONE Performed at [facility], [place] [address], [place], [place] [address]    Culture >=100,000 COLONIES/mL ESCHERICHIA COLI (A)  Final   Report Status 06/17/2018 FINAL  Final   Organism ID, Bacteria ESCHERICHIA COLI (A)  Final      Susceptibility   Escherichia coli - MIC*    AMPICILLIN >=32 RESISTANT Resistant     CEFAZOLIN 16 SENSITIVE Sensitive     CEFTRIAXONE <=1 SENSITIVE Sensitive     CIPROFLOXACIN >=4 RESISTANT Resistant     GENTAMICIN <=1 SENSITIVE Sensitive     IMIPENEM <=0.25 SENSITIVE Sensitive     NITROFURANTOIN <=16 SENSITIVE Sensitive     TRIMETH/SULFA >=320 RESISTANT Resistant     AMPICILLIN/SULBACTAM >=32 RESISTANT Resistant     PIP/TAZO >=128 RESISTANT Resistant     Extended ESBL NEGATIVE Sensitive     * >=100,000 COLONIES/mL ESCHERICHIA COLI  Culture, blood (routine x 2)     Status: None   Collection Time: 06/16/18  1:52 PM  Result Value Ref Range Status   Specimen Description BLOOD RIGHT ARM  Final   Special Requests   Final    BOTTLES DRAWN AEROBIC AND ANAEROBIC Blood Culture adequate volume   Culture   Final    NO GROWTH 5 DAYS Performed at [facility], [address], [place], [place] [address]    Report Status 06/21/2018 FINAL  Final  Culture, blood (routine x 2)     Status: None   Collection Time: 06/16/18  1:55 PM  Result Value Ref Range Status   Specimen Description BLOOD LEFT HAND  Final   Special Requests   Final    BOTTLES DRAWN AEROBIC ONLY Blood Culture results may not be optimal due to an inadequate volume of blood received in culture bottles   Culture   Final    NO GROWTH 5 DAYS Performed at [facility], [place] [address] [place], [place] [address]    Report Status 06/21/2018 FINAL  Final    Studies/Results: US Ob Limited  Result Date: 06/22/2018 CLINICAL DATA:  IV drug use.  Assess viability EXAM: LIMITED OBSTETRIC ULTRASOUND FINDINGS: Number of Fetuses: 1 Heart Rate:  137 bpm Movement: Visualized Presentation: Cephalic Placental Location: Anterior Previa: No Amniotic Fluid (Subjective):  Within normal limits. AFI:  cm BPD: 5.1 cm 21 w  3 d MATERNAL FINDINGS: Cervix:  Appears closed. Uterus/Adnexae: No abnormality visualized. IMPRESSION: Viable 21 week 3 day intrauterine pregnancy. Fetal heart rate 137 beats per minute. No acute maternal findings. This exam is performed on an emergent basis and does not comprehensively evaluate fetal size, dating, or anatomy; follow-up complete OB US should be considered if further fetal assessment is warranted. Electronically Signed   By: Rolm Baptise M.D.   On: 06/22/2018 21:26      Assessment/Plan:  INTERVAL HISTORY: Blood cultures negative at 5 days final   Principal Problem:   Bacteremia due to Enterococcus Active Problems:   Opioid use disorder, severe, dependence (HCC)   Sepsis due to pneumonia ([place])   IV drug abuse  complicating pregnancy ([place])   Multifocal pneumonia   Hyponatremia   Elevated transaminase level   Degenerative arthritis of lumbar spine   History of bacterial endocarditis   Severe tricuspid regurgitation by prior echocardiogram   Influenza B    Kelly [name] is a 33 y.o. female withEnterococcus faecalis and penicillin/ceftriaxone resistant Streptococcus sanguinis and Strep mitis bacteremia with previous endocarditis who has relapsed with heroin usage and is currently [redacted] weeks pregnant and complicated course with Influenza B. 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

## 2018-06-23 NOTE — Progress Notes (Signed)
PROGRESS NOTE  Kelly [name] DZH:[id number] DOB: 05/02/1986 DOA: 06/14/2018 PCP: [facility]  Brief History   Pt. 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  Patient is [redacted] weeks pregnant.   OB Gyn has consulted and has recommended a number of prenatal and screening labs which have been ordered.  Infectious disease has been consulted and is recommending a total of 6 weeks of IV antibiotic therapy for the patient's mixed strep and enterococcal bacteremia/tricuspid endocarditis. She is not a surgical candidate. She is receiving Vancomycin, ampicillin, and rocephin. She is also receiving Tamiflu for Influenza B infection.  Currently further plan is continue current antibiotics and supportive measures.  A & P  Sepsis secondary to multifocal pneumonia and streptococcal/enterococcal bacteremia, and Influenza B: Sepsis resolving. Pt presented with cough, malaise, hypotension, and tachycardia, leukocytosis, and elevated lactic acid. ID has been consulted and the patient has been transitioned to IV Rocephin, ampicillin, and vancomycin. The patient is also receiving Tamiflu once pharmacy vouched for its safety in pregnancy.  Acute respiratory failure: The patient is saturating at 100% on room air. She is complaining of feeling short of breath. Likely due to effect of gravid abdomen on diaphragm.   Myalgias: Pt is complaining of pain all over. She is receiving 80 mg of methadone daily.  Tricuspid valve endocarditis with vegetation size 53m x 268m CTS has been consulted. 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 Echocardiogram shows worsening vegetation 10 mm x 20 mm which was 8 mm x 9 mm  last time. 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 MRI of the lumbar spine was negative for any acute abnormality at this time. Cardiothoracic surgery consulted, currently not a surgical candidate. OBGyn recommend continue current management. ID is recommending 6 weeks of IV antibiotic therapy.  IV heroin abuse. History of polysubstance abuse: Patient reports that she was on chronic methadone therapy at 100 mg daily. She stopped going to the methadone clinic in December 2019. [facility] the patient's last methadone dose was 70 mg in 03/2018. She is no longer a patient there.Patient reports that she has been using 62m21migrams of IV heroin.She is requesting to go to rehabilitation program on discharge.OB/GYN attending on call. They have recommended that the patient undergo BTL after baby delivered. Knowing that physiologically there will be demand for frequent dosing I will increase her last documented clinic dose of 70 mg to 80 mg and divide her 80 mg methadone dose to 40 mg BID. There will be no further dose increase. Monitor QTC while on methadone. Currently stable.  Continue Phenergan for nausea and Zofran for refractory nausea. Benadryl for itching as well as other withdrawal symptoms. Patient reports that she has allergic reaction to Suboxone although on further questioning she mentions that she started having withdrawal symptoms while she was on Suboxone therapy and never had any rash or throat swelling that required hospitalization. Patient was clearly informed that there is no way that we can provide methadone on discharge prescription and therefore she has to contact her methadone clinic prior to discharge. Discussed the case with the social worker, they provided the patient information regarding [facility]. When close to discharge, we will involve  case management from [facility] to see if they have any more assistance for the patient.  20 weeks IUP by ultrasound: OB GYN has been consutled. The fetal heart rate is being monitored daily by [facility] RN. For any OB related complaints contact OB/GYN attending on call. I appreciate OB/Gyn's assistance.  Elevated LFT: Monitor. No abdominal pain. Likely reactive in response to acute infection. Trending down. Does not appear to be having like help syndrome.  Physiological anemia of pregnancy: A bit worse due to dilution. Monitor.  Diet: Regular diet DVT Prophylaxis: subcutaneous Heparin Advance goals of care discussion: full code Family Communication: Emergency contact is [name] ([place]) [phone]  Disposition: Discharge to be determined.  Will likely need prolonged antibiotic course which needs to be completed here in the hospital.   Karie Kirks, DO [facility] Direct contact: see [email or web address]  7PM-7AM contact night coverage as above 06/23/2018, 5:24 PM  LOS: 8 days   Consultants  . Cardiothoracic surgery, Infectious Disease, and OB/Gyn  Interval History/Subjective  The patient is awake, alert, and oriented x 3. No acute distress.  Objective   Vitals:  Vitals:   06/23/18 1133 06/23/18 1634  BP: (!) 120/91 (!) 144/97  Pulse: 82 93  Resp: 20 (!) 28  Temp: 98 F (36.7 C) 98.2 F (36.8 C)  SpO2: 96% 98%    Exam:  Constitutional:  . The patient is awake, alert, and oriented x 3. No acute distress. Respiratory:  . No wheezes, rales, or rhonchi. . No increased work of breathing.  . No tactile fremitus. Cardiovascular:  . Regular rate and rhythm.RR, no murmurs, ectopy, or gallups. . No LE extremity edema   . Normal pedal pulses Abdomen:  . Abdomen gravid. Non-tender, non-distended. . No hernias, massess or organomegaly can be appreciated. . Normoactive bowel sounds. Musculoskeletal:  . Digits/nails BUE: no clubbing, cyanosis, petechiae,  infection . exam of joints, bones, muscles of at least one of following: head/neck, RUE, LUE, RLE, LLE   o strength and tone normal, no atrophy, no abnormal movements o No tenderness, masses o Normal ROM, no contractures  Skin:  . No rashes, lesions, ulcers . palpation of skin: no induration or nodules Neurologic:  . CN 2-12 intact . Sensation all 4 extremities intact Psychiatric:  . Mental status o Mood, affect appropriate o Orientation to person, place, time  . judgment and insight appear intact   I have seen and examined this patient myself. I have spent 32 minutes in her evaluation and care. I have personally reviewed the following:   Today's Data  . Laboratory and vitals   Scheduled Meds: . enoxaparin (LOVENOX) injection  40 mg Subcutaneous Daily  . feeding supplement (ENSURE ENLIVE)  237 mL Oral BID BM  . methadone  40 mg Oral Q12H  . prenatal multivitamin  1 tablet Oral Q1200  . sodium chloride flush  10-40 mL Intracatheter Q12H  . sodium chloride flush  3 mL Intravenous Q12H   Continuous Infusions: . ampicillin (OMNIPEN) IV 2 g (06/23/18 1625)  . cefTRIAXone (ROCEPHIN)  IV 2 g (06/23/18 1045)  . vancomycin 1,250 mg (06/23/18 1710)    Principal Problem:   Bacteremia due to Enterococcus Active Problems:   Opioid use disorder, severe, dependence (HCC)   Sepsis due to pneumonia ([place])   IV drug abuse complicating pregnancy ([place])   Multifocal pneumonia   Hyponatremia   Elevated transaminase level  Degenerative arthritis of lumbar spine   History of bacterial endocarditis   Severe tricuspid regurgitation by prior echocardiogram   Influenza B   Rubella non-immune status, antepartum   LOS: 8 days

## 2018-06-23 NOTE — Progress Notes (Signed)
Dr. Ilda Basset notified that pt's protein creatine ratio is .59.

## 2018-06-23 NOTE — Progress Notes (Signed)
Spoke with Dr. Harolyn Rutherford. Notified of the folowing blood pressures, 06/22/2018, [id number], [id number], [id number]. Today at  1133, bp 120/91. Dr. Harolyn Rutherford is reviewing the chart. Says she will put in orders for a protein/creatine ratio and a CMET.

## 2018-06-24 ENCOUNTER — Inpatient Hospital Stay (HOSPITAL_COMMUNITY): Payer: Medicaid Other

## 2018-06-24 DIAGNOSIS — Z363 Encounter for antenatal screening for malformations: Secondary | ICD-10-CM

## 2018-06-24 DIAGNOSIS — Z3A21 21 weeks gestation of pregnancy: Secondary | ICD-10-CM

## 2018-06-24 DIAGNOSIS — O10012 Pre-existing essential hypertension complicating pregnancy, second trimester: Secondary | ICD-10-CM

## 2018-06-24 DIAGNOSIS — O99322 Drug use complicating pregnancy, second trimester: Secondary | ICD-10-CM

## 2018-06-24 LAB — CBC WITH DIFFERENTIAL/PLATELET
Abs Immature Granulocytes: 0.16 10*3/uL — ABNORMAL HIGH (ref 0.00–0.07)
Basophils Absolute: 0 10*3/uL (ref 0.0–0.1)
Basophils Relative: 0 %
Eosinophils Absolute: 0 10*3/uL (ref 0.0–0.5)
Eosinophils Relative: 0 %
HCT: 24.1 % — ABNORMAL LOW (ref 36.0–46.0)
Hemoglobin: 7.5 g/dL — ABNORMAL LOW (ref 12.0–15.0)
Immature Granulocytes: 1 %
LYMPHS ABS: 2.1 10*3/uL (ref 0.7–4.0)
Lymphocytes Relative: 15 %
MCH: 28.3 pg (ref 26.0–34.0)
MCHC: 31.1 g/dL (ref 30.0–36.0)
MCV: 90.9 fL (ref 80.0–100.0)
MONOS PCT: 6 %
Monocytes Absolute: 0.8 10*3/uL (ref 0.1–1.0)
Neutro Abs: 11.2 10*3/uL — ABNORMAL HIGH (ref 1.7–7.7)
Neutrophils Relative %: 78 %
Platelets: 344 10*3/uL (ref 150–400)
RBC: 2.65 MIL/uL — ABNORMAL LOW (ref 3.87–5.11)
RDW: 16.7 % — ABNORMAL HIGH (ref 11.5–15.5)
WBC: 14.4 10*3/uL — ABNORMAL HIGH (ref 4.0–10.5)
nRBC: 0 % (ref 0.0–0.2)

## 2018-06-24 LAB — BASIC METABOLIC PANEL
Anion gap: 12 (ref 5–15)
BUN: <5 mg/dL — ABNORMAL LOW (ref 6–20)
CO2: 24 mmol/L (ref 22–32)
Calcium: 8.3 mg/dL — ABNORMAL LOW (ref 8.9–10.3)
Chloride: 97 mmol/L — ABNORMAL LOW (ref 98–111)
Creatinine, Ser: 0.43 mg/dL — ABNORMAL LOW (ref 0.44–1.00)
GFR calc Af Amer: >60 mL/min (ref >60)
GFR calc non Af Amer: >60 mL/min (ref >60)
Glucose, Bld: 82 mg/dL (ref 70–99)
Potassium: 4.2 mmol/L (ref 3.5–5.1)
Sodium: 133 mmol/L — ABNORMAL LOW (ref 135–145)

## 2018-06-24 LAB — HEPATITIS B SURFACE ANTIGEN: Hepatitis B Surface Ag: NEGATIVE

## 2018-06-24 MED ORDER — METHADONE HCL 10 MG PO TABS
60.0000 mg | ORAL_TABLET | Freq: Every day | ORAL | Status: DC
  Administered 2018-06-24 – 2018-07-30 (×37): 60 mg via ORAL
  Filled 2018-06-24 (×37): qty 6

## 2018-06-24 MED ORDER — METHADONE HCL 10 MG PO TABS
40.0000 mg | ORAL_TABLET | Freq: Every day | ORAL | Status: DC
  Administered 2018-06-24 – 2018-07-30 (×37): 40 mg via ORAL
  Filled 2018-06-24 (×37): qty 4

## 2018-06-24 NOTE — Progress Notes (Signed)
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   Assessment & Plan:   Principal Problem:   Bacteremia due to Enterococcus Active Problems:   Opioid use disorder, severe, dependence (HCC)   Sepsis due to pneumonia ([place])   IV drug abuse complicating pregnancy ([place])   Multifocal pneumonia   Hyponatremia   Elevated transaminase level   Degenerative arthritis of lumbar spine   History of bacterial endocarditis   Severe tricuspid regurgitation by prior echocardiogram   Influenza B   Rubella non-immune status, antepartum   #) Sepsis due to tricuspid valve endocarditis due to enterococcus faecalis, Streptococcus sanguinous and mitis: Likely secondary to IV drug abuse.  She has evidence of severe tricuspid regurgitation on imaging. -Continue IV ceftriaxone, vancomycin, ampicillin started 06/17/2018 -Infectious disease following, appreciate recommendations -Disposition is difficult as patient is IV drug user and she will need long-term IV antibiotics. -Blood cultures on 06/14/2018 growing out enterococcus, 2 species of Streptococcus -Blood cultures on 06/16/2018 no growth to date  #) Pregnancy: Patient is approximately 23 to [redacted] weeks pregnant. -OB following, appreciate recommendations - OB screening has been performed and is notable for negative hep B surface antibody, negative RPR, patient is rubella nonimmune there screening tests for baby will be followed up by OB  #)  Influenza B: Patient was presented with influenza B infection and completed a course of oseltamivir  Fluids: Tolerating p.o. Electrolytes: Monitor and supplement Nutrition: Regular diet  Prophylaxis: Enoxaparin  Disposition: Ending likely completion of IV antibiotics  Full code    Consultants:   Cardiothoracic surgery  Infectious disease  Procedures:  06/16/2018 echo:  1. Tricuspid regurgitation severe by color flow Doppler. There is a large mobile tricuspid valve vegetation on the anterior leaflet. The TV vegetation measures 10 mm x 20 mm.  2. The left ventricle has normal systolic function of 53-61%. The cavity size is normal. There is no left ventricular wall thickness. Echo evidence of normal diastolic filling patterns.  3. The right ventricle is moderately enlarged in size. There is normal systolic function. Right ventricular systolic pressure is normal with an estimated pressure of 43.8 mmHg.  4. Mildly dilated left atrial size.  5. Moderately dilated right atrial size.  6. The mitral valve is myxomatous. There is mild thickening. Regurgitation is not visualized by color flow Doppler.  7. Large vegetation on the tricuspid valve.  8. Myxomatous tricuspid valve.  9. Tricuspid regurgitation severe. 10. No atrial level shunt detected by color flow Doppler.    Antimicrobials:  IV ceftriaxone started 05/17/2018  IV vancomycin started 05/17/2018  IV ampicillin started 05/17/2018   Subjective: Patient reports diffuse pain across her body.  She denies any chest pain, nausea, vomiting, diarrhea, cough, congestion, rhinorrhea.  She is requesting increased dose of her methadone.  Objective: Vitals:   06/23/18 2230 06/24/18 0000 06/24/18 0500 06/24/18 1222  BP: 123/86   (!) 136/105  Pulse: 93 89  92  Resp: (!) 28 (!) 33  18  Temp: 98.4 F (36.9 C)  98 F (36.7 C) 97.6  F (36.4 C)  TempSrc: Oral  Oral Axillary  SpO2: 95% 97%  97%  Weight:   90.3 kg   Height:         Intake/Output Summary (Last 24 hours) at 06/24/2018 1258 Last data filed at 06/24/2018 0558 Gross per 24 hour  Intake 2430 ml  Output 1600 ml  Net 830 ml   Filed Weights   06/18/18 0339 06/21/18 0453 06/24/18 0500  Weight: 83.1 kg 88.1 kg 90.3 kg    Examination:  General exam: Appears calm and comfortable  Respiratory system: Clear to auscultation. Respiratory effort normal.  Scattered rhonchi Cardiovascular system: Regular rate and rhythm, 3 out of 6 systolic murmur heard best across precordium Gastrointestinal system: Soft, nondistended, no rebound or guarding. Central nervous system: Alert and oriented.  Grossly intact, moving all extremities Extremities: Trace lower extremity edema Skin: Numerous injection marks, picking, right upper extremity Psychiatry: Judgement and insight appear normal. Mood & affect appropriate.     Data Reviewed: I have personally reviewed following labs and imaging studies  CBC: Recent Labs  Lab 06/20/18 2044 06/21/18 0500 06/22/18 0500 06/23/18 0601 06/24/18 0552  WBC 11.1* 11.2* 13.5* 15.0* 14.4*  NEUTROABS 8.3* 8.0* 10.7* 12.1* 11.2*  HGB 8.0* 7.9* 7.6* 7.4* 7.5*  HCT 26.3* 26.2* 25.2* 23.8* 24.1*  MCV 91.3 91.6 92.0 91.5 90.9  PLT 302 307 291 306 428   Basic Metabolic Panel: Recent Labs  Lab 06/18/18 0603 06/19/18 0229 06/20/18 0530 06/21/18 0500 06/23/18 1625 06/24/18 0552  NA 137 137 134*  --  134* 133*  K 3.7 3.8 3.4*  --  4.3 4.2  CL 107 105 102  --  98 97*  CO2 22 22 23   --  26 24  GLUCOSE 99 81 99  --  87 82  BUN 5* 5* 5*  --  <5* <5*  CREATININE 0.56 0.57 0.50 0.46 0.46 0.43*  CALCIUM 8.0* 7.9* 7.6*  --  8.1* 8.3*   GFR: Estimated Creatinine Clearance: 115.4 mL/min (A) (by C-G formula based on SCr of 0.43 mg/dL (L)). Liver Function Tests: Recent Labs  Lab 06/18/18 0603 06/19/18 0229 06/20/18 0530 06/23/18 1625  AST 18 19 17  14*  ALT 10 9 7 6   ALKPHOS 182* 171* 164* 181*  BILITOT 0.8 0.7 0.4 1.1  PROT  6.0* 5.8* 5.5* 6.5  ALBUMIN 1.6* 1.6* 1.5* 1.6*   No results for input(s): LIPASE, AMYLASE in the last 168 hours. No results for input(s): AMMONIA in the last 168 hours. Coagulation Profile: No results for input(s): INR, PROTIME in the last 168 hours. Cardiac Enzymes: No results for input(s): CKTOTAL, CKMB, CKMBINDEX, TROPONINI in the last 168 hours. BNP (last 3 results) No results for input(s): PROBNP in the last 8760 hours. HbA1C: No results for input(s): HGBA1C in the last 72 hours. CBG: Recent Labs  Lab 06/18/18 0806 06/19/18 0841 06/20/18 0800 06/21/18 0733 06/23/18 0736  GLUCAP 81 78 84 85 104*   Lipid Profile: No results for input(s): CHOL, HDL, LDLCALC, TRIG, CHOLHDL, LDLDIRECT in the last 72 hours. Thyroid Function Tests: No results for input(s): TSH, T4TOTAL, FREET4, T3FREE, THYROIDAB in the last 72 hours. Anemia Panel: No results for input(s): VITAMINB12, FOLATE, FERRITIN, TIBC, IRON, RETICCTPCT in the last 72 hours. Sepsis Labs: No results for input(s): PROCALCITON, LATICACIDVEN in the last 168 hours.  Recent Results (from the past 240 hour(s))  Culture, blood (Routine x 2)     Status: Abnormal   Collection Time: 06/14/18  2:30 PM  Result Value Ref Range Status   Specimen Description BLOOD RIGHT ANTECUBITAL  Final   Special Requests   Final    BOTTLES DRAWN AEROBIC AND ANAEROBIC Blood Culture adequate volume   Culture  Setup Time   Final    GRAM POSITIVE COCCI IN CHAINS IN BOTH AEROBIC AND ANAEROBIC BOTTLES CRITICAL VALUE NOTED.  VALUE IS CONSISTENT WITH PREVIOUSLY REPORTED AND CALLED VALUE. Performed at [facility], [place] [address] [place], [place] [address]    Culture (A)  Final    STREPTOCOCCUS MITIS/ORALIS CORRECTED ON 02/06 AT 9163: PREVIOUSLY REPORTED AS VIRIDANS STREPTOCOCCUS ENTEROCOCCUS FAECALIS    Report Status 06/19/2018 FINAL  Final   Organism ID, Bacteria STREPTOCOCCUS MITIS/ORALIS  Final      Susceptibility   Streptococcus  mitis/oralis - MIC*    TETRACYCLINE >=16 RESISTANT Resistant     VANCOMYCIN 0.5 SENSITIVE Sensitive     CLINDAMYCIN <=0.25 SENSITIVE Sensitive     CEFTRIAXONE Value in next row Sensitive      SENSITIVE0.5    PENICILLIN Value in next row Intermediate      INTERMEDIATE0.25    * STREPTOCOCCUS MITIS/ORALIS CORRECTED ON 02/06 AT 8466: PREVIOUSLY REPORTED AS VIRIDANS STREPTOCOCCUS  Culture, blood (Routine x 2)     Status: Abnormal   Collection Time: 06/14/18  2:35 PM  Result Value Ref Range Status   Specimen Description BLOOD RIGHT ANTECUBITAL  Final   Special Requests   Final    BOTTLES DRAWN AEROBIC AND ANAEROBIC Blood Culture results may not be optimal due to an excessive volume of blood received in culture bottles   Culture  Setup Time   Final    GRAM POSITIVE COCCI IN CHAINS IN BOTH AEROBIC AND ANAEROBIC BOTTLES CRITICAL RESULT CALLED TO, READ BACK BY AND VERIFIED WITH: Serita Grammes [id number] 06/15/2018 Mena Goes Performed at [facility], [place] [address], [place], [place] [address]    Culture STREPTOCOCCUS SANGUINIS ENTEROCOCCUS FAECALIS  (A)  Final   Report Status 06/18/2018 FINAL  Final   Organism ID, Bacteria STREPTOCOCCUS SANGUINIS  Final   Organism ID, Bacteria ENTEROCOCCUS FAECALIS  Final      Susceptibility   Streptococcus sanguinis - MIC*    PENICILLIN 4 RESISTANT Resistant     CEFTRIAXONE >=8 RESISTANT Resistant     ERYTHROMYCIN 2 RESISTANT Resistant     LEVOFLOXACIN 1 SENSITIVE Sensitive     VANCOMYCIN 1 SENSITIVE Sensitive     * STREPTOCOCCUS SANGUINIS   Enterococcus faecalis - MIC*    AMPICILLIN <=2 SENSITIVE Sensitive     VANCOMYCIN 1 SENSITIVE Sensitive     GENTAMICIN SYNERGY SENSITIVE Sensitive     * ENTEROCOCCUS FAECALIS  Blood Culture ID Panel (Reflexed)     Status: Abnormal   Collection Time: 06/14/18  2:35 PM  Result Value Ref Range Status   Enterococcus species NOT DETECTED NOT DETECTED Final   Listeria monocytogenes NOT DETECTED NOT DETECTED Final    Staphylococcus species NOT DETECTED NOT DETECTED Final   Staphylococcus aureus (BCID) NOT DETECTED NOT DETECTED Final   Streptococcus species DETECTED (A) NOT DETECTED Final    Comment: Not Enterococcus species, Streptococcus agalactiae, Streptococcus pyogenes, or Streptococcus pneumoniae. CRITICAL RESULT CALLED TO, READ BACK BY AND VERIFIED WITH: J. LEDFORD,PHARMD [id number] 06/15/2018 T. TYSOR    Streptococcus agalactiae NOT DETECTED NOT DETECTED Final   Streptococcus pneumoniae NOT DETECTED NOT DETECTED Final   Streptococcus pyogenes NOT DETECTED NOT DETECTED Final   Acinetobacter baumannii NOT DETECTED NOT DETECTED Final  Enterobacteriaceae species NOT DETECTED NOT DETECTED Final   Enterobacter cloacae complex NOT DETECTED NOT DETECTED Final   Escherichia coli NOT DETECTED NOT DETECTED Final   Klebsiella oxytoca NOT DETECTED NOT DETECTED Final   Klebsiella pneumoniae NOT DETECTED NOT DETECTED Final   Proteus species NOT DETECTED NOT DETECTED Final   Serratia marcescens NOT DETECTED NOT DETECTED Final   Haemophilus influenzae NOT DETECTED NOT DETECTED Final   Neisseria meningitidis NOT DETECTED NOT DETECTED Final   Pseudomonas aeruginosa NOT DETECTED NOT DETECTED Final   Candida albicans NOT DETECTED NOT DETECTED Final   Candida glabrata NOT DETECTED NOT DETECTED Final   Candida krusei NOT DETECTED NOT DETECTED Final   Candida parapsilosis NOT DETECTED NOT DETECTED Final   Candida tropicalis NOT DETECTED NOT DETECTED Final    Comment: Performed at [facility], [place] [address] [place], [place] [address]  Urine Culture     Status: Abnormal   Collection Time: 06/14/18 10:03 PM  Result Value Ref Range Status   Specimen Description URINE, RANDOM  Final   Special Requests   Final    NONE Performed at [facility], [place] [address], [place], [place] [address]    Culture >=100,000 COLONIES/mL ESCHERICHIA COLI (A)  Final   Report Status 06/17/2018 FINAL  Final   Organism ID,  Bacteria ESCHERICHIA COLI (A)  Final      Susceptibility   Escherichia coli - MIC*    AMPICILLIN >=32 RESISTANT Resistant     CEFAZOLIN 16 SENSITIVE Sensitive     CEFTRIAXONE <=1 SENSITIVE Sensitive     CIPROFLOXACIN >=4 RESISTANT Resistant     GENTAMICIN <=1 SENSITIVE Sensitive     IMIPENEM <=0.25 SENSITIVE Sensitive     NITROFURANTOIN <=16 SENSITIVE Sensitive     TRIMETH/SULFA >=320 RESISTANT Resistant     AMPICILLIN/SULBACTAM >=32 RESISTANT Resistant     PIP/TAZO >=128 RESISTANT Resistant     Extended ESBL NEGATIVE Sensitive     * >=100,000 COLONIES/mL ESCHERICHIA COLI  Culture, blood (routine x 2)     Status: None   Collection Time: 06/16/18  1:52 PM  Result Value Ref Range Status   Specimen Description BLOOD RIGHT ARM  Final   Special Requests   Final    BOTTLES DRAWN AEROBIC AND ANAEROBIC Blood Culture adequate volume   Culture   Final    NO GROWTH 5 DAYS Performed at [name], [address] [place], [place] [address]    Report Status 06/21/2018 FINAL  Final  Culture, blood (routine x 2)     Status: None   Collection Time: 06/16/18  1:55 PM  Result Value Ref Range Status   Specimen Description BLOOD LEFT HAND  Final   Special Requests   Final    BOTTLES DRAWN AEROBIC ONLY Blood Culture results may not be optimal due to an inadequate volume of blood received in culture bottles   Culture   Final    NO GROWTH 5 DAYS Performed at [facility], [place] [address], [place], [place] [address]    Report Status 06/21/2018 FINAL  Final         Radiology Studies: US Ob Limited  Result Date: 06/22/2018 CLINICAL DATA:  IV drug use.  Assess viability EXAM: LIMITED OBSTETRIC ULTRASOUND FINDINGS: Number of Fetuses: 1 Heart Rate:  137 bpm Movement: Visualized Presentation: Cephalic Placental Location: Anterior Previa: No Amniotic Fluid (Subjective):  Within normal limits. AFI:  cm BPD: 5.1 cm 21 w  3  d MATERNAL FINDINGS: Cervix:  Appears closed. Uterus/Adnexae: No  abnormality visualized. IMPRESSION: Viable 21 week 3 day intrauterine pregnancy. Fetal heart rate 137 beats per minute. No acute maternal findings. This exam is performed on an emergent basis and does not comprehensively evaluate fetal size, dating, or anatomy; follow-up complete OB US should be considered if further fetal assessment is warranted. Electronically Signed   By: Rolm Baptise M.D.   On: 06/22/2018 21:26   [place] Mfm Ob Detail +14 Wk  Result Date: 06/24/2018 ----------------------------------------------------------------------  OBSTETRICS REPORT                       (Signed Final 06/24/2018 10:54 am) ---------------------------------------------------------------------- Patient Info  ID #:       [id number]                          D.O.B.:  [birth year] (32 yrs)  Name:       Kelly [name]              Visit Date: 06/24/2018 08:50 am ---------------------------------------------------------------------- Performed By  Performed By:     Valda Favia          Ref. Address:     [place]                    [place], [place]  Attending:        Tama High MD        Location:         [name]  Referred By:      Osborne Oman MD ---------------------------------------------------------------------- Orders   #  Description                          Code         Ordered By   1  [place] MFM OB DETAIL +14 WK              37342.87     Verita Schneiders  ----------------------------------------------------------------------   #  Order #                    Accession #  Episode #   1  [id number]                  [id number]                  [id number]  ---------------------------------------------------------------------- Indications   Encounter for antenatal screening for          Z36.3    malformations   Drug use complicating pregnancy, second        O99.322   trimester (Heroin, methadone currently)   Hypertension - Chronic/Pre-existing            O10.019   [redacted] weeks gestation of pregnancy                Z3A.21  ---------------------------------------------------------------------- Vital Signs  Weight (lb): 199                               Height:        5'7"  BMI:         31.16 ---------------------------------------------------------------------- Fetal Evaluation  Num Of Fetuses:         1  Fetal Heart Rate(bpm):  155  Cardiac Activity:       Observed  Presentation:           Breech  Placenta:               Anterior  P. Cord Insertion:      Visualized, central  Amniotic Fluid  AFI FV:      Within normal limits                              Largest Pocket(cm)                              6.1 ---------------------------------------------------------------------- Biometry  BPD:        50  mm     G. Age:  21w 1d         36  %    CI:        75.85   %    70 - 86                                                          FL/HC:      19.9   %    15.9 - 20.3  HC:       182   mm     G. Age:  20w 4d         11  %    HC/AC:      1.12        1.06 - 1.25  AC:      163.1  mm     G. Age:  21w 3d         42  %    FL/BPD:     72.4   %  FL:       36.2  mm     G. Age:  21w 3d         43  %    FL/AC:      22.2   %  20 - 24  HUM:      31.8  mm     G. Age:  20w 4d         27  %  Est. FW:     416  gm    0 lb 15 oz      42  % ---------------------------------------------------------------------- OB History  Gravidity:    6         Term:   2        Prem:   0        SAB:   3  TOP:          0       Ectopic:  0        Living: 2 ---------------------------------------------------------------------- Gestational Age  LMP:           26w 4d        Date:  12/20/17                 EDD:   09/26/18  U/S Today:     21w 1d                                        EDD:   11/03/18  Best:          Audrea Muscat 3d     Det. ByLoman Chroman          EDD:   11/01/18                                      (04/01/18) ---------------------------------------------------------------------- Anatomy  Cranium:               Appears normal         Aortic Arch:            Not well visualized  Cavum:                 Appears normal         Ductal Arch:            Not well visualized  Ventricles:            Appears normal         Diaphragm:              Appears normal  Choroid Plexus:        Appears normal         Stomach:                Appears normal, left                                                                        sided  Cerebellum:            Appears normal         Abdomen:                Appears normal  Posterior Fossa:       Appears normal  Abdominal Wall:         Appears nml (cord                                                                        insert, abd wall)  Nuchal Fold:           Not well visualized    Cord Vessels:           Appears normal (3                                                                        vessel cord)  Face:                  Orbits nl; profile not Kidneys:                Appear normal                         well visualized  Lips:                  Not well visualized    Bladder:                Appears normal  Thoracic:              Appears normal         Spine:                  Not well visualized  Heart:                 Appears normal         Upper Extremities:      Appears normal                         (4CH, axis, and                         situs)  RVOT:                  Appears normal         Lower Extremities:      Appears normal  LVOT:                  Not well visualized  Other:  Fetus appears to be female. Technically difficult due to maternal          habitus and fetal position. ---------------------------------------------------------------------- Cervix Uterus Adnexa  Cervix  Length:            3.7  cm.  Uterus  No abnormality visualized.  Left Ovary  No adnexal mass visualized.  Right Ovary  No adnexal  mass visualized.  Cul De Sac  No free fluid seen.  Adnexa  No abnormality visualized. ---------------------------------------------------------------------- Impression  Ms. Luffman, G6 P2 at 21-weeks' gestation, was admitted  with the  diagnosis of sepsis. She has a history of previous  cesarean delivery. Ob consultation was obtained (Dr.  Ilda Basset).  We performed a fetal anatomy scan. No markers of  aneuploidies or fetal structural defects are seen. Fetal  biometry is consistent with her previously-established dates.  Amniotic fluid is normal and good fetal activity is seen.  Patient understands the limitations of ultrasound in detecting  fetal anomalies. Resolution of images on bedside ultrasound  was suboptimal. ---------------------------------------------------------------------- Recommendations  -Follow-up scan in 4 weeks for completion of fetal anatomical  survey. It should, preferably, done at the MFM office in the  new [facility] to obtain quality images. ----------------------------------------------------------------------                  Tama High, MD Electronically Signed Final Report   06/24/2018 10:54 am ----------------------------------------------------------------------       Scheduled Meds: . enoxaparin (LOVENOX) injection  40 mg Subcutaneous Daily  . feeding supplement (ENSURE ENLIVE)  237 mL Oral BID BM  . methadone  40 mg Oral q1800  . methadone  60 mg Oral Daily  . prenatal multivitamin  1 tablet Oral Q1200  . sodium chloride flush  10-40 mL Intracatheter Q12H  . sodium chloride flush  3 mL Intravenous Q12H   Continuous Infusions: . ampicillin (OMNIPEN) IV 2 g (06/24/18 1240)  . cefTRIAXone (ROCEPHIN)  IV 2 g (06/24/18 1009)  . vancomycin 1,250 mg (06/24/18 0914)     LOS: 9 days    Time spent: [place], MD [facility]  If 7PM-7AM, please contact night-coverage [email or web address] Password [name] 06/24/2018, 12:58 PM

## 2018-06-25 ENCOUNTER — Inpatient Hospital Stay (HOSPITAL_COMMUNITY): Payer: Medicaid Other

## 2018-06-25 LAB — CBC
HCT: 23.9 % — ABNORMAL LOW (ref 36.0–46.0)
Hemoglobin: 7.2 g/dL — ABNORMAL LOW (ref 12.0–15.0)
MCH: 27.5 pg (ref 26.0–34.0)
MCHC: 30.1 g/dL (ref 30.0–36.0)
MCV: 91.2 fL (ref 80.0–100.0)
Platelets: 356 10*3/uL (ref 150–400)
RBC: 2.62 MIL/uL — ABNORMAL LOW (ref 3.87–5.11)
RDW: 16.5 % — ABNORMAL HIGH (ref 11.5–15.5)
WBC: 11.4 10*3/uL — ABNORMAL HIGH (ref 4.0–10.5)
nRBC: 0 % (ref 0.0–0.2)

## 2018-06-25 LAB — COMPREHENSIVE METABOLIC PANEL WITH GFR
Total Bilirubin: 0.7 mg/dL (ref 0.3–1.2)
Total Protein: 6 g/dL — ABNORMAL LOW (ref 6.5–8.1)

## 2018-06-25 LAB — COMPREHENSIVE METABOLIC PANEL
ALT: 6 U/L (ref 0–44)
AST: 15 U/L (ref 15–41)
Albumin: 1.4 g/dL — ABNORMAL LOW (ref 3.5–5.0)
Alkaline Phosphatase: 166 U/L — ABNORMAL HIGH (ref 38–126)
Anion gap: 7 (ref 5–15)
BUN: <5 mg/dL — ABNORMAL LOW (ref 6–20)
CO2: 27 mmol/L (ref 22–32)
Calcium: 8 mg/dL — ABNORMAL LOW (ref 8.9–10.3)
Chloride: 99 mmol/L (ref 98–111)
Creatinine, Ser: 0.48 mg/dL (ref 0.44–1.00)
GFR calc Af Amer: >60 mL/min (ref >60)
GFR calc non Af Amer: >60 mL/min (ref >60)
Glucose, Bld: 94 mg/dL (ref 70–99)
Potassium: 3.7 mmol/L (ref 3.5–5.1)
Sodium: 133 mmol/L — ABNORMAL LOW (ref 135–145)

## 2018-06-25 LAB — MAGNESIUM: Magnesium: 1.7 mg/dL (ref 1.7–2.4)

## 2018-06-25 LAB — GLUCOSE, CAPILLARY: Glucose-Capillary: 75 mg/dL (ref 70–99)

## 2018-06-25 LAB — VANCOMYCIN, TROUGH: Vancomycin Tr: 14 ug/mL — ABNORMAL LOW (ref 15–20)

## 2018-06-25 LAB — VANCOMYCIN, PEAK: Vancomycin Pk: 30 ug/mL (ref 30–40)

## 2018-06-25 MED ORDER — VANCOMYCIN HCL IN DEXTROSE 1-5 GM/200ML-% IV SOLN
1000.0000 mg | Freq: Three times a day (TID) | INTRAVENOUS | Status: DC
  Administered 2018-06-26 – 2018-07-27 (×95): 1000 mg via INTRAVENOUS
  Filled 2018-06-25 (×98): qty 200

## 2018-06-25 MED ORDER — BENZOCAINE 10 % MT GEL
Freq: Four times a day (QID) | OROMUCOSAL | Status: DC | PRN
  Administered 2018-06-25 – 2018-06-26 (×3): via OROMUCOSAL
  Filled 2018-06-25 (×2): qty 9

## 2018-06-25 MED ORDER — PHENOL 1.4 % MT LIQD
1.0000 | OROMUCOSAL | Status: DC | PRN
  Administered 2018-06-25: 1 via OROMUCOSAL
  Filled 2018-06-25: qty 177

## 2018-06-25 NOTE — Progress Notes (Signed)
Patient retaining urine > 464m  Has tried to void multiple times overnight with continued urine residual  Also complains of increasing worsening oral pain from abscessed tooth.   [facility] notified

## 2018-06-25 NOTE — Progress Notes (Addendum)
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  New events last 24 hours / Subjective: Complaining of tooth abscess pain.  Assessment & Plan:   Principal Problem:   Bacteremia due to Enterococcus Active Problems:   Opioid use disorder, severe, dependence (HCC)   Sepsis due to pneumonia ([place])   IV drug abuse complicating pregnancy ([place])   Multifocal pneumonia   Hyponatremia   Elevated transaminase level   Degenerative arthritis of lumbar spine   History of bacterial endocarditis   Severe tricuspid regurgitation by prior echocardiogram   Influenza B   Rubella non-immune status, antepartum  Sepsis due to tricuspid valve endocarditis due to enterococcus faecalis, Streptococcus sanguinous and mitis -Likely secondary to IV drug abuse.  She has evidence of severe tricuspid regurgitation on imaging. -Blood cultures on 06/14/2018 growing out enterococcus, 2 species of Streptococcus -Blood cultures on 06/16/2018 negative  -Continue IV ceftriaxone, vancomycin, ampicillin started 06/17/2018 -Infectious disease following, appreciate recommendations -Disposition is difficult as patient is IV drug user and she will need long-term IV antibiotics.  Pregnancy: Patient is approximately 23 to [redacted] weeks pregnant. -OB following, appreciate recommendations -OB screening has been performed and is notable for negative hep B surface antibody, negative RPR, patient is  rubella nonimmune there screening tests for baby will be followed up by OB  Influenza B -Patient was presented with influenza B infection and completed a course of oseltamivir  Polysubstance abuse -Continue methadone   Dental caries -Check orthopantogram    DVT prophylaxis: Lovenox Code Status: Full code Family Communication: No family at bedside Disposition Plan: Complete IV antibiotics inpatient until 08/03/2018   Consultants:   Cardiothoracic surgery  Infectious disease  Antimicrobials:  Anti-infectives (From admission, onward)   Start     Dose/Rate Route Frequency Ordered Stop   06/23/18 0000  vancomycin (VANCOCIN) 1,250 mg in sodium chloride 0.9 % 250 mL IVPB     1,250 mg 166.7 mL/hr over 90 Minutes Intravenous Every 8 hours 06/22/18 2332 08/03/18 2359   06/20/18 1000  vancomycin (VANCOCIN) 2,000 mg in sodium chloride 0.9 % 500 mL IVPB  Status:  Discontinued     2,000 mg 250 mL/hr over 120 Minutes Intravenous Every 12 hours 06/19/18 2221 06/22/18 2332   06/18/18 2000  vancomycin (VANCOCIN) IVPB 1000 mg/200 mL premix  Status:  Discontinued     1,000 mg 200 mL/hr over 60 Minutes Intravenous Every 8 hours 06/18/18 1054 06/19/18 2221   06/18/18 1200  vancomycin (VANCOCIN) 1,750 mg in sodium chloride 0.9 % 500 mL IVPB     1,750 mg 250 mL/hr over 120 Minutes Intravenous  Once 06/18/18 1054 06/18/18 2241   06/17/18 2200  cefTRIAXone (ROCEPHIN) 2 g in sodium chloride 0.9 % 100 mL IVPB     2 g 200 mL/hr over 30 Minutes Intravenous Every 12 hours 06/17/18 1046 08/03/18 2359   06/17/18 1200  ampicillin (OMNIPEN) 2 g in sodium chloride 0.9 % 100 mL IVPB     2 g 300 mL/hr over 20 Minutes  Intravenous Every 4 hours 06/17/18 1045 08/03/18 2359   06/16/18 1300  oseltamivir (TAMIFLU) capsule 75 mg     75 mg Oral 2 times daily 06/16/18 1231 06/20/18 0919   06/15/18 0700  vancomycin (VANCOCIN) IVPB 1000 mg/200 mL premix  Status:  Discontinued     1,000 mg 200 mL/hr over 60 Minutes  Intravenous Every 8 hours 06/14/18 2216 06/15/18 0711   06/15/18 0500  cefTRIAXone (ROCEPHIN) 2 g in sodium chloride 0.9 % 100 mL IVPB  Status:  Discontinued     2 g 200 mL/hr over 30 Minutes Intravenous Every 24 hours 06/15/18 0445 06/17/18 1046   06/14/18 2200  azithromycin (ZITHROMAX) 500 mg in sodium chloride 0.9 % 250 mL IVPB  Status:  Discontinued     500 mg 250 mL/hr over 60 Minutes Intravenous Every 24 hours 06/14/18 2134 06/15/18 0711   06/14/18 2200  cefTRIAXone (ROCEPHIN) 1 g in sodium chloride 0.9 % 100 mL IVPB  Status:  Discontinued     1 g 200 mL/hr over 30 Minutes Intravenous Every 24 hours 06/14/18 2134 06/15/18 0445   06/14/18 2200  vancomycin (VANCOCIN) 1,500 mg in sodium chloride 0.9 % 500 mL IVPB     1,500 mg 250 mL/hr over 120 Minutes Intravenous  Once 06/14/18 2134 06/15/18 0235        Objective: Vitals:   06/25/18 0400 06/25/18 0500 06/25/18 0746 06/25/18 1145  BP:  129/82 121/87 135/80  Pulse:  84 81   Resp:  16 18 15   Temp: 98 F (36.7 C) 98 F (36.7 C) 98.1 F (36.7 C) 97.8 F (36.6 C)  TempSrc: Oral Oral Oral Axillary  SpO2:  96% 96% 95%  Weight:  91.5 kg    Height:        Intake/Output Summary (Last 24 hours) at 06/25/2018 1203 Last data filed at 06/25/2018 0600 Gross per 24 hour  Intake 3776.82 ml  Output 2100 ml  Net 1676.82 ml   Filed Weights   06/21/18 0453 06/24/18 0500 06/25/18 0500  Weight: 88.1 kg 90.3 kg 91.5 kg    Examination:  General exam: Appears calm and comfortable  Respiratory system: Clear to auscultation. Respiratory effort normal. Cardiovascular system: S1 & S2 heard, RRR. No JVD, murmurs, rubs, gallops or clicks. No pedal edema. Gastrointestinal system: Abdomen is soft and nontender. No organomegaly or masses felt. Normal bowel sounds heard. Central nervous system: Alert and oriented. No focal neurological deficits. Extremities: Symmetric 5 x 5 power. Skin: No rashes, lesions or ulcers Psychiatry: Judgement and  insight appear normal. Mood & affect appropriate.   Data Reviewed: I have personally reviewed following labs and imaging studies  CBC: Recent Labs  Lab 06/20/18 2044 06/21/18 0500 06/22/18 0500 06/23/18 0601 06/24/18 0552 06/25/18 0553  WBC 11.1* 11.2* 13.5* 15.0* 14.4* 11.4*  NEUTROABS 8.3* 8.0* 10.7* 12.1* 11.2*  --   HGB 8.0* 7.9* 7.6* 7.4* 7.5* 7.2*  HCT 26.3* 26.2* 25.2* 23.8* 24.1* 23.9*  MCV 91.3 91.6 92.0 91.5 90.9 91.2  PLT 302 307 291 306 344 322   Basic Metabolic Panel: Recent Labs  Lab 06/19/18 0229 06/20/18 0530 06/21/18 0500 06/23/18 1625 06/24/18 0552 06/25/18 0553  NA 137 134*  --  134* 133* 133*  K 3.8 3.4*  --  4.3 4.2 3.7  CL 105 102  --  98 97* 99  CO2 22 23  --  26 24 27   GLUCOSE 81 99  --  87 82 94  BUN 5* 5*  --  <  5* <5* <5*  CREATININE 0.57 0.50 0.46 0.46 0.43* 0.48  CALCIUM 7.9* 7.6*  --  8.1* 8.3* 8.0*  MG  --   --   --   --   --  1.7   GFR: Estimated Creatinine Clearance: 116.2 mL/min (by C-G formula based on SCr of 0.48 mg/dL). Liver Function Tests: Recent Labs  Lab 06/19/18 0229 06/20/18 0530 06/23/18 1625 06/25/18 0553  AST 19 17 14* 15  ALT 9 7 6 6   ALKPHOS 171* 164* 181* 166*  BILITOT 0.7 0.4 1.1 0.7  PROT 5.8* 5.5* 6.5 6.0*  ALBUMIN 1.6* 1.5* 1.6* 1.4*   No results for input(s): LIPASE, AMYLASE in the last 168 hours. No results for input(s): AMMONIA in the last 168 hours. Coagulation Profile: No results for input(s): INR, PROTIME in the last 168 hours. Cardiac Enzymes: No results for input(s): CKTOTAL, CKMB, CKMBINDEX, TROPONINI in the last 168 hours. BNP (last 3 results) No results for input(s): PROBNP in the last 8760 hours. HbA1C: No results for input(s): HGBA1C in the last 72 hours. CBG: Recent Labs  Lab 06/19/18 0841 06/20/18 0800 06/21/18 0733 06/23/18 0736 06/25/18 0744  GLUCAP 78 84 85 104* 75   Lipid Profile: No results for input(s): CHOL, HDL, LDLCALC, TRIG, CHOLHDL, LDLDIRECT in the last 72  hours. Thyroid Function Tests: No results for input(s): TSH, T4TOTAL, FREET4, T3FREE, THYROIDAB in the last 72 hours. Anemia Panel: No results for input(s): VITAMINB12, FOLATE, FERRITIN, TIBC, IRON, RETICCTPCT in the last 72 hours. Sepsis Labs: No results for input(s): PROCALCITON, LATICACIDVEN in the last 168 hours.  Recent Results (from the past 240 hour(s))  Culture, blood (routine x 2)     Status: None   Collection Time: 06/16/18  1:52 PM  Result Value Ref Range Status   Specimen Description BLOOD RIGHT ARM  Final   Special Requests   Final    BOTTLES DRAWN AEROBIC AND ANAEROBIC Blood Culture adequate volume   Culture   Final    NO GROWTH 5 DAYS Performed at [facility], [address], [place], [place] [address]    Report Status 06/21/2018 FINAL  Final  Culture, blood (routine x 2)     Status: None   Collection Time: 06/16/18  1:55 PM  Result Value Ref Range Status   Specimen Description BLOOD LEFT HAND  Final   Special Requests   Final    BOTTLES DRAWN AEROBIC ONLY Blood Culture results may not be optimal due to an inadequate volume of blood received in culture bottles   Culture   Final    NO GROWTH 5 DAYS Performed at [facility], [address] [place],  [address]    Report Status 06/21/2018 FINAL  Final       Radiology Studies: [place] Mfm Ob Detail +14 Wk  Result Date: 06/24/2018 ----------------------------------------------------------------------  OBSTETRICS REPORT                       (Signed Final 06/24/2018 10:54 am) ---------------------------------------------------------------------- Patient Info  ID #:       [id number]                          D.O.B.:  14-May-1986 (32 yrs)  Name:       [name]              Visit Date: 06/24/2018 08:50 am ---------------------------------------------------------------------- Performed By  Performed By:  [place]          Ref. Address:     [address]                    [place], [place]  Attending:        Tama High MD        Location:         [name]  Referred By:      Osborne Oman MD ---------------------------------------------------------------------- Orders   #  Description                          Code         Ordered By   1  [place] MFM OB DETAIL +14 WK              76734.19     Verita Schneiders  ----------------------------------------------------------------------   #  Order #                    Accession #                 Episode #   1  [id number]                  [id number]                  [id number]  ---------------------------------------------------------------------- Indications   Encounter for antenatal screening for          Z36.3   malformations   Drug use complicating pregnancy, second        O99.322   trimester (Heroin, methadone currently)   Hypertension - Chronic/Pre-existing            O10.019   [redacted] weeks gestation of pregnancy                Z3A.21  ---------------------------------------------------------------------- Vital Signs  Weight (lb): 199                               Height:        5'7"  BMI:         31.16 ---------------------------------------------------------------------- Fetal Evaluation  Num Of Fetuses:  1  Fetal Heart Rate(bpm):  155  Cardiac Activity:       Observed  Presentation:           Breech  Placenta:               Anterior  P. Cord Insertion:      Visualized, central  Amniotic Fluid  AFI FV:      Within normal limits                              Largest Pocket(cm)                              6.1 ---------------------------------------------------------------------- Biometry  BPD:        50  mm     G. Age:  21w 1d         36  %    CI:        75.85   %    70 - 86                                                          FL/HC:       19.9   %    15.9 - 20.3  HC:       182   mm     G. Age:  20w 4d         11  %    HC/AC:      1.12        1.06 - 1.25  AC:      163.1  mm     G. Age:  21w 3d         42  %    FL/BPD:     72.4   %  FL:       36.2  mm     G. Age:  21w 3d         27  %    FL/AC:      22.2   %    20 - 24  HUM:      31.8  mm     G. Age:  20w 4d         27  %  Est. FW:     416  gm    0 lb 15 oz      42  % ---------------------------------------------------------------------- OB History  Gravidity:    6         Term:   2        Prem:   0        SAB:   3  TOP:          0       Ectopic:  0        Living: 2 ---------------------------------------------------------------------- Gestational Age  LMP:           26w 4d        Date:  12/20/17                 EDD:   09/26/18  U/S Today:     21w 1d  EDD:   11/03/18  Best:          Audrea Muscat 3d     Det. ByLoman Chroman         EDD:   11/01/18                                      (04/01/18) ---------------------------------------------------------------------- Anatomy  Cranium:               Appears normal         Aortic Arch:            Not well visualized  Cavum:                 Appears normal         Ductal Arch:            Not well visualized  Ventricles:            Appears normal         Diaphragm:              Appears normal  Choroid Plexus:        Appears normal         Stomach:                Appears normal, left                                                                        sided  Cerebellum:            Appears normal         Abdomen:                Appears normal  Posterior Fossa:       Appears normal         Abdominal Wall:         Appears nml (cord                                                                        insert, abd wall)  Nuchal Fold:           Not well visualized    Cord Vessels:           Appears normal (3                                                                        vessel cord)  Face:                  Orbits nl;  profile not Kidneys:  Appear normal                         well visualized  Lips:                  Not well visualized    Bladder:                Appears normal  Thoracic:              Appears normal         Spine:                  Not well visualized  Heart:                 Appears normal         Upper Extremities:      Appears normal                         (4CH, axis, and                         situs)  RVOT:                  Appears normal         Lower Extremities:      Appears normal  LVOT:                  Not well visualized  Other:  Fetus appears to be female. Technically difficult due to maternal          habitus and fetal position. ---------------------------------------------------------------------- Cervix Uterus Adnexa  Cervix  Length:            3.7  cm.  Uterus  No abnormality visualized.  Left Ovary  No adnexal mass visualized.  Right Ovary  No adnexal mass visualized.  Cul De Sac  No free fluid seen.  Adnexa  No abnormality visualized. ---------------------------------------------------------------------- Impression  Ms. Turek, G6 P2 at 21-weeks' gestation, was admitted  with the diagnosis of sepsis. She has a history of previous  cesarean delivery. Ob consultation was obtained (Dr.  Ilda Basset).  We performed a fetal anatomy scan. No markers of  aneuploidies or fetal structural defects are seen. Fetal  biometry is consistent with her previously-established dates.  Amniotic fluid is normal and good fetal activity is seen.  Patient understands the limitations of ultrasound in detecting  fetal anomalies. Resolution of images on bedside ultrasound  was suboptimal. ---------------------------------------------------------------------- Recommendations  -Follow-up scan in 4 weeks for completion of fetal anatomical  survey. It should, preferably, done at the MFM office in the  new [facility] to obtain quality images.  ----------------------------------------------------------------------                  Tama High, MD Electronically Signed Final Report   06/24/2018 10:54 am ----------------------------------------------------------------------     Scheduled Meds: . enoxaparin (LOVENOX) injection  40 mg Subcutaneous Daily  . feeding supplement (ENSURE ENLIVE)  237 mL Oral BID BM  . methadone  40 mg Oral q1800  . methadone  60 mg Oral Daily  . prenatal multivitamin  1 tablet Oral Q1200  . sodium chloride flush  10-40 mL Intracatheter Q12H  . sodium chloride flush  3 mL Intravenous Q12H   Continuous Infusions: . ampicillin (OMNIPEN) IV 2 g (06/25/18 1139)  . cefTRIAXone (ROCEPHIN)  IV 2  g (06/25/18 1106)  . vancomycin 1,250 mg (06/25/18 0836)     LOS: 10 days    Time spent: 35 minutes   Dessa Phi, DO [facility] [email or web address] 06/25/2018, 12:03 PM

## 2018-06-25 NOTE — Progress Notes (Signed)
Pharmacy Antibiotic Note  Kelly [name] is a 33 y.o. female admitted on 06/14/2018 with strep and enterococcal endocarditis.  Pharmacy has been consulted for vancomycin dosing. Patient has been on ampicillin and ceftriaxone for the Enterococcus isolated in her bloodstream but unfortunately her Strep sanguinis species has come back as resistant to ceftriaxone and penicillin, so vancomycin was added.   Vancomycin AUC is elevated at 577 (goal AUC 400-550).  Renal function is stable.  Plan: Reduce vanc to 1gm IV Q8H, start tomorrow since this PM dose already started Continue ampicillin and Rocephin per MD Monitor renal fxn, repeat vanc AUC as appropriate Update OPAT   Height: 5' 7"  (170.2 cm) Weight: 201 lb 11.5 oz (91.5 kg) IBW/kg (Calculated) : 61.6  Temp (24hrs), Avg:97.9 F (36.6 C), Min:97.8 F (36.6 C), Max:98.1 F (36.7 C)  Recent Labs  Lab 06/20/18 0530  06/21/18 0500 06/22/18 0500 06/22/18 1607 06/22/18 2212 06/23/18 0601 06/23/18 1625 06/24/18 0552 06/25/18 0553 06/25/18 1145 06/25/18 1530  WBC 10.4   < > 11.2* 13.5*  --   --  15.0*  --  14.4* 11.4*  --   --   CREATININE 0.50  --  0.46  --   --   --   --  0.46 0.43* 0.48  --   --   VANCOTROUGH  --   --   --   --   --  9*  --   --   --   --   --  14*  VANCOPEAK  --   --   --   --  34  --   --   --   --   --  30  --    < > = values in this interval not displayed.    Estimated Creatinine Clearance: 116.2 mL/min (by C-G formula based on SCr of 0.48 mg/dL).    Allergies  Allergen Reactions  . Hydrocodone Itching  . Morphine And Related Nausea And Vomiting    Azithromycin 2/1>>2/2 Vancomcyin 2/1>>2/2, 2/5>> Ceftriaxone 2/1>> Ampicillin 2/4>> Tamiflu 2/3>>2/7  2/6: Levels 19/11 (5 hours apart) -  on 1gm q8hh - AUC low at 389 on this dose >> dose adjusted to 2g IV q12h for estimated AUC 517 2/9: Levels 39/9 (12 hrs apart) - on Vanc 2gm IV q12h - >AUC supratherapeutic at 597 (goal 400-550) > adjusted to  1250 mg IV q8h for estimated AUC 556 - Calculated Ke 0.2185, half-life 3.2 hrs 2/12: VP/VT 30/14, AUC 577 on 12102m q8 >> 1g q8  2/1Blood x 2: Enterococcus faecalis (pan-S), Strep sanguinis 1/2 (R-PCN and CTX, S-Vanc) and strep mitis/oralis (R-tetra, I-PCN, S-Vanc/Clinda/CTX) 2/1Urine: E coli - sensitive to cefazolin, ceftriaxone; R - amp, cipro, septra, unasyn, zosyn 2/3 Influenza PCR - positive B 2/3 Blood x 2: negative 2/7 RPR negative 2/7 HIV non-reactive 2/7 Rubella < 0.90 = non-immune  Audryana Hockenberry D. DMina Marble PharmD, BCPS, BDunbar2/04/2019, 5:56 PM

## 2018-06-26 DIAGNOSIS — O26892 Other specified pregnancy related conditions, second trimester: Secondary | ICD-10-CM

## 2018-06-26 LAB — GLUCOSE, CAPILLARY: Glucose-Capillary: 121 mg/dL — ABNORMAL HIGH (ref 70–99)

## 2018-06-26 LAB — AFP TETRA
AFP: 50.5 ng/mL
DIA Value (EIA): 253.43 pg/mL
DSR (By Age)    1 IN: 360
HCG, Total: 29426 m[IU]/mL
Maternal Age At EDD: 34.1 yr
Tri 18 Scr Risk Est: 1:1401 {titer}
uE3 Value: 0.49 ng/mL

## 2018-06-26 LAB — URINALYSIS, ROUTINE W REFLEX MICROSCOPIC
Bilirubin Urine: NEGATIVE
Glucose, UA: NEGATIVE mg/dL
Hgb urine dipstick: NEGATIVE
KETONES UR: NEGATIVE mg/dL
Leukocytes,Ua: NEGATIVE
Nitrite: NEGATIVE
Protein, ur: NEGATIVE mg/dL
Specific Gravity, Urine: 1.006 (ref 1.005–1.030)
pH: 7 (ref 5.0–8.0)

## 2018-06-26 NOTE — Consult Note (Signed)
[facility] OB/GYN Attending Follow up Consult Note  Subjective:  Patient is without any obstetric concerns.  FHR reassuring, no contractions, no LOF or vaginal bleeding. Good FM.     Objective:  Blood pressure (!) 138/94, pulse 79, temperature 98.1 F (36.7 C), temperature source Oral, resp. rate 16, height 5' 7"  (1.702 m), weight 91.5 kg, last menstrual period 12/30/2017, SpO2 95 %, unknown if currently breastfeeding.  Vitals:   06/25/18 1145 06/25/18 1636 06/25/18 2300 06/26/18 0823  BP: 135/80 125/87 (!) 130/95 (!) 138/94  Pulse:  76 78 79  Resp: 15  16   Temp: 97.8 F (36.6 C) 97.8 F (36.6 C) 98.2 F (36.8 C) 98.1 F (36.7 C)  TempSrc: Axillary Oral Oral Oral  SpO2: 95% 96% 95% 95%  Weight:      Height:        FHT  Baseline 143 bpm, Gen: Sleepy, NAD HENT: Normocephalic, atraumatic Lungs: Normal respiratory effort Heart: Regular rate noted Abdomen: NT, gravid fundus, soft Cervix: Deferred Ext: 2+ DTRs, no edema, no cyanosis, negative Homan's sign  Labs:  CBC Latest Ref Rng & Units 06/25/2018 06/24/2018 06/23/2018  WBC 4.0 - 10.5 K/uL 11.4(H) 14.4(H) 15.0(H)  Hemoglobin 12.0 - 15.0 g/dL 7.2(L) 7.5(L) 7.4(L)  Hematocrit 36.0 - 46.0 % 23.9(L) 24.1(L) 23.8(L)  Platelets 150 - 400 K/uL 356 344 306   CMP Latest Ref Rng & Units 06/25/2018 06/24/2018 06/23/2018  Glucose 70 - 99 mg/dL 94 82 87  BUN 6 - 20 mg/dL <5(L) <5(L) <5(L)  Creatinine 0.44 - 1.00 mg/dL 0.48 0.43(L) 0.46  Sodium 135 - 145 mmol/L 133(L) 133(L) 134(L)  Potassium 3.5 - 5.1 mmol/L 3.7 4.2 4.3  Chloride 98 - 111 mmol/L 99 97(L) 98  CO2 22 - 32 mmol/L 27 24 26   Calcium 8.9 - 10.3 mg/dL 8.0(L) 8.3(L) 8.1(L)  Total Protein 6.5 - 8.1 g/dL 6.0(L) - 6.5  Total Bilirubin 0.3 - 1.2 mg/dL 0.7 - 1.1  Alkaline Phos 38 - 126 U/L 166(H) - 181(H)  AST 15 - 41 U/L 15 - 14(L)  ALT 0 - 44 U/L 6 - 6    Assessment & Plan:  33 y.o. [id number] at 51w5dadmitted for polymicrobial tricuspid valve endocarditis in the  setting of known IVDA, and multiple other medical issues.  Principal Problem:   Bacteremia due to Enterococcus Active Problems:   Opioid use disorder, severe, dependence (HCC)   Sepsis due to pneumonia ([place]   [place] drug abuse complicating [place] ([place]   Multifocal pneumonia   Hyponatremia   Elevated transaminase level   Degenerative arthritis of lumbar spine   History of bacterial endocarditis   Severe tricuspid regurgitation by prior echocardiogram   Influenza B   Rubella non-immune status, antepartum  Continue management of medical issues as per primary team and consultation services.  From an obstetric standpoint, patient is stable.  Concerned about persistent DBP in 90s, but she has no signs/symptoms of preeclampsia. Will continue to monitor.  FHR reassuring.  We will continue to follow along; will continue to monitor FHR about twice a week given gestation fetal previability.   Please call [phone](OB/GYN Attending on call) for any concerns.     UVerita Schneiders MD, FLancasterfor WDean Foods Company [place]

## 2018-06-26 NOTE — Progress Notes (Addendum)
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  New events last 24 hours / Subjective: Had some intermittent urinary retention, in and out yesterday.  Voiding spontaneously today, complaining of dysuria.  Assessment & Plan:   Principal Problem:   Bacteremia due to Enterococcus Active Problems:   Opioid use disorder, severe, dependence (HCC)   Sepsis due to pneumonia ([place])   IV drug abuse complicating pregnancy ([place])   Multifocal pneumonia   Hyponatremia   Elevated transaminase level   Degenerative arthritis of lumbar spine   History of bacterial endocarditis   Severe tricuspid regurgitation by prior echocardiogram   Influenza B   Rubella non-immune status, antepartum  Sepsis due to tricuspid valve endocarditis due to enterococcus faecalis, Streptococcus sanguinous and mitis -Likely secondary to IV drug abuse.  She has evidence of severe tricuspid regurgitation on imaging. -Blood cultures on 06/14/2018 growing out enterococcus, 2 species of Streptococcus -Blood cultures on 06/16/2018 negative  -Continue IV ceftriaxone, vancomycin, ampicillin started 06/17/2018 -Initiate infectious disease recommendations -Disposition is difficult as patient is IV drug user and she will need long-term IV antibiotics. Will remain inpatient through 08/03/2018 for IV antibiotic tx.   Pregnancy: Patient is approximately 23 to [redacted] weeks pregnant. -OB following,  appreciate recommendations -OB screening has been performed and is notable for negative hep B surface antibody, negative RPR, patient is rubella nonimmune there screening tests for baby will be followed up by OB  Influenza B -Patient was presented with influenza B infection and completed a course of oseltamivir  Polysubstance abuse -Continue methadone   Dental caries -Orthopantogram completed which revealed periapical luncency right mandicular molar tooth compatible with abscess, dental caries. Will call dental   Dysuria -UA negative for infection. On antibiotics as above regardless.    DVT prophylaxis: Lovenox Code Status: Full code Family Communication: No family at bedside Disposition Plan: Complete IV antibiotics inpatient until 08/03/2018   Consultants:   Cardiothoracic surgery  Infectious disease  Antimicrobials:  Anti-infectives (From admission, onward)   Start     Dose/Rate Route Frequency Ordered Stop   06/26/18 0100  vancomycin (VANCOCIN) IVPB 1000 mg/200 mL premix     1,000 mg 200 mL/hr over 60 Minutes Intravenous Every 8 hours 06/25/18 1758 08/03/18 2359   06/23/18 0000  vancomycin (VANCOCIN) 1,250 mg in sodium chloride 0.9 % 250 mL IVPB  Status:  Discontinued     1,250 mg 166.7 mL/hr over 90 Minutes Intravenous Every 8 hours 06/22/18 2332 06/25/18 1758   06/20/18 1000  vancomycin (VANCOCIN) 2,000 mg in sodium chloride 0.9 % 500 mL IVPB  Status:  Discontinued     2,000 mg 250 mL/hr over 120 Minutes Intravenous Every 12 hours 06/19/18 2221 06/22/18 2332   06/18/18 2000  vancomycin (VANCOCIN) IVPB 1000 mg/200 mL premix  Status:  Discontinued     1,000 mg 200 mL/hr over 60 Minutes Intravenous Every 8 hours 06/18/18 1054 06/19/18 2221   06/18/18 1200  vancomycin (VANCOCIN) 1,750 mg in sodium chloride 0.9 % 500 mL  IVPB     1,750 mg 250 mL/hr over 120 Minutes Intravenous  Once 06/18/18 1054 06/18/18 2241   06/17/18 2200  cefTRIAXone (ROCEPHIN) 2 g in sodium  chloride 0.9 % 100 mL IVPB     2 g 200 mL/hr over 30 Minutes Intravenous Every 12 hours 06/17/18 1046 08/03/18 2359   06/17/18 1200  ampicillin (OMNIPEN) 2 g in sodium chloride 0.9 % 100 mL IVPB     2 g 300 mL/hr over 20 Minutes Intravenous Every 4 hours 06/17/18 1045 08/03/18 2359   06/16/18 1300  oseltamivir (TAMIFLU) capsule 75 mg     75 mg Oral 2 times daily 06/16/18 1231 06/20/18 0919   06/15/18 0700  vancomycin (VANCOCIN) IVPB 1000 mg/200 mL premix  Status:  Discontinued     1,000 mg 200 mL/hr over 60 Minutes Intravenous Every 8 hours 06/14/18 2216 06/15/18 0711   06/15/18 0500  cefTRIAXone (ROCEPHIN) 2 g in sodium chloride 0.9 % 100 mL IVPB  Status:  Discontinued     2 g 200 mL/hr over 30 Minutes Intravenous Every 24 hours 06/15/18 0445 06/17/18 1046   06/14/18 2200  azithromycin (ZITHROMAX) 500 mg in sodium chloride 0.9 % 250 mL IVPB  Status:  Discontinued     500 mg 250 mL/hr over 60 Minutes Intravenous Every 24 hours 06/14/18 2134 06/15/18 0711   06/14/18 2200  cefTRIAXone (ROCEPHIN) 1 g in sodium chloride 0.9 % 100 mL IVPB  Status:  Discontinued     1 g 200 mL/hr over 30 Minutes Intravenous Every 24 hours 06/14/18 2134 06/15/18 0445   06/14/18 2200  vancomycin (VANCOCIN) 1,500 mg in sodium chloride 0.9 % 500 mL IVPB     1,500 mg 250 mL/hr over 120 Minutes Intravenous  Once 06/14/18 2134 06/15/18 0235       Objective: Vitals:   06/25/18 1145 06/25/18 1636 06/25/18 2300 06/26/18 0823  BP: 135/80 125/87 (!) 130/95 (!) 138/94  Pulse:  76 78 79  Resp: 15  16   Temp: 97.8 F (36.6 C) 97.8 F (36.6 C) 98.2 F (36.8 C) 98.1 F (36.7 C)  TempSrc: Axillary Oral Oral Oral  SpO2: 95% 96% 95% 95%  Weight:      Height:        Intake/Output Summary (Last 24 hours) at 06/26/2018 1100 Last data filed at 06/26/2018 0444 Gross per 24 hour  Intake 2390.31 ml  Output 3600 ml  Net -1209.69 ml   Filed Weights   06/21/18 0453 06/24/18 0500 06/25/18 0500  Weight: 88.1 kg 90.3 kg  91.5 kg    Examination: General exam: Appears calm and comfortable  Respiratory system: Clear to auscultation. Respiratory effort normal. Cardiovascular system: S1 & S2 heard, RRR.  Gastrointestinal system: Abdomen is nondistended, soft and nontender. No organomegaly or masses felt. Normal bowel sounds heard. Central nervous system: Alert and oriented. No focal neurological deficits. Extremities: Symmetric 5 x 5 power. Skin: No rashes, lesions or ulcers Psychiatry: Judgement and insight appear normal. Mood & affect appropriate.    Data Reviewed: I have personally reviewed following labs and imaging studies  CBC: Recent Labs  Lab 06/20/18 2044 06/21/18 0500 06/22/18 0500 06/23/18 0601 06/24/18 0552 06/25/18 0553  WBC 11.1* 11.2* 13.5* 15.0* 14.4* 11.4*  NEUTROABS 8.3* 8.0* 10.7* 12.1* 11.2*  --   HGB 8.0* 7.9* 7.6* 7.4* 7.5* 7.2*  HCT 26.3* 26.2* 25.2* 23.8* 24.1* 23.9*  MCV 91.3 91.6 92.0 91.5 90.9 91.2  PLT 302 307 291 306 344 356  Basic Metabolic Panel: Recent Labs  Lab 06/20/18 0530 06/21/18 0500 06/23/18 1625 06/24/18 0552 06/25/18 0553  NA 134*  --  134* 133* 133*  K 3.4*  --  4.3 4.2 3.7  CL 102  --  98 97* 99  CO2 23  --  26 24 27   GLUCOSE 99  --  87 82 94  BUN 5*  --  <5* <5* <5*  CREATININE 0.50 0.46 0.46 0.43* 0.48  CALCIUM 7.6*  --  8.1* 8.3* 8.0*  MG  --   --   --   --  1.7   GFR: Estimated Creatinine Clearance: 116.2 mL/min (by C-G formula based on SCr of 0.48 mg/dL). Liver Function Tests: Recent Labs  Lab 06/20/18 0530 06/23/18 1625 06/25/18 0553  AST 17 14* 15  ALT 7 6 6   ALKPHOS 164* 181* 166*  BILITOT 0.4 1.1 0.7  PROT 5.5* 6.5 6.0*  ALBUMIN 1.5* 1.6* 1.4*   No results for input(s): LIPASE, AMYLASE in the last 168 hours. No results for input(s): AMMONIA in the last 168 hours. Coagulation Profile: No results for input(s): INR, PROTIME in the last 168 hours. Cardiac Enzymes: No results for input(s): CKTOTAL, CKMB, CKMBINDEX,  TROPONINI in the last 168 hours. BNP (last 3 results) No results for input(s): PROBNP in the last 8760 hours. HbA1C: No results for input(s): HGBA1C in the last 72 hours. CBG: Recent Labs  Lab 06/20/18 0800 06/21/18 0733 06/23/18 0736 06/25/18 0744 06/26/18 0821  GLUCAP 84 85 104* 75 121*   Lipid Profile: No results for input(s): CHOL, HDL, LDLCALC, TRIG, CHOLHDL, LDLDIRECT in the last 72 hours. Thyroid Function Tests: No results for input(s): TSH, T4TOTAL, FREET4, T3FREE, THYROIDAB in the last 72 hours. Anemia Panel: No results for input(s): VITAMINB12, FOLATE, FERRITIN, TIBC, IRON, RETICCTPCT in the last 72 hours. Sepsis Labs: No results for input(s): PROCALCITON, LATICACIDVEN in the last 168 hours.  Recent Results (from the past 240 hour(s))  Culture, blood (routine x 2)     Status: None   Collection Time: 06/16/18  1:52 PM  Result Value Ref Range Status   Specimen Description BLOOD RIGHT ARM  Final   Special Requests   Final    BOTTLES DRAWN AEROBIC AND ANAEROBIC Blood Culture adequate volume   Culture   Final    NO GROWTH 5 DAYS Performed at [facility], [address], [place], [place] [address]    Report Status 06/21/2018 FINAL  Final  Culture, blood (routine x 2)     Status: None   Collection Time: 06/16/18  1:55 PM  Result Value Ref Range Status   Specimen Description BLOOD LEFT HAND  Final   Special Requests   Final    BOTTLES DRAWN AEROBIC ONLY Blood Culture results may not be optimal due to an inadequate volume of blood received in culture bottles   Culture   Final    NO GROWTH 5 DAYS Performed at [facility], [place] [address] [place], [place] [address]    Report Status 06/21/2018 FINAL  Final       Radiology Studies: Dg Orthopantogram  Result Date: 06/25/2018 CLINICAL DATA:  33 year old female with a history of abscess EXAM: ORTHOPANTOGRAM/PANORAMIC COMPARISON:  None. FINDINGS: Periapical lucency of the right mandibular last molar tooth.  Multiple lucencies of mandibular and maxillary T compatible with dental caries Dental amalgams. No displaced fracture. IMPRESSION: Periapical lucency the right mandibular last molar tooth compatible with abscess. Dental caries. Electronically Signed  By: Corrie Mckusick D.O.   On: 06/25/2018 19:26      Scheduled Meds: . enoxaparin (LOVENOX) injection  40 mg Subcutaneous Daily  . feeding supplement (ENSURE ENLIVE)  237 mL Oral BID BM  . methadone  40 mg Oral q1800  . methadone  60 mg Oral Daily  . prenatal multivitamin  1 tablet Oral Q1200  . sodium chloride flush  10-40 mL Intracatheter Q12H  . sodium chloride flush  3 mL Intravenous Q12H   Continuous Infusions: . ampicillin (OMNIPEN) IV 2 g (06/26/18 0743)  . cefTRIAXone (ROCEPHIN)  IV 2 g (06/26/18 9597)  . vancomycin 1,000 mg (06/26/18 0951)     LOS: 11 days    Time spent: 25 minutes   Dessa Phi, DO [facility] [email or web address] 06/26/2018, 11:00 AM

## 2018-06-26 NOTE — Progress Notes (Signed)
FHR dopplers at 143bpm, pt has no complaints today.  Dr Rosana Hoes updated.

## 2018-06-27 DIAGNOSIS — O09893 Supervision of other high risk pregnancies, third trimester: Secondary | ICD-10-CM

## 2018-06-27 LAB — URINE CULTURE: Culture: NO GROWTH

## 2018-06-27 LAB — CYSTIC FIBROSIS MUTATION 97: Interpretation: NOT DETECTED

## 2018-06-27 LAB — GLUCOSE, CAPILLARY: Glucose-Capillary: 74 mg/dL (ref 70–99)

## 2018-06-27 NOTE — Progress Notes (Addendum)
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  New events last 24 hours / Subjective: No new issues overnight.   Assessment & Plan:   Principal Problem:   Bacteremia due to Enterococcus Active Problems:   Opioid use disorder, severe, dependence (HCC)   Sepsis due to pneumonia ([place])   IV drug abuse complicating pregnancy ([place])   Multifocal pneumonia   Hyponatremia   Elevated transaminase level   Degenerative arthritis of lumbar spine   History of bacterial endocarditis   Severe tricuspid regurgitation by prior echocardiogram   Influenza B   Rubella non-immune status, antepartum  Sepsis due to tricuspid valve endocarditis due to enterococcus faecalis, Streptococcus sanguinous and mitis -Likely secondary to IV drug abuse.  She has evidence of severe tricuspid regurgitation on imaging. -Blood cultures on 06/14/2018 growing out enterococcus, 2 species of Streptococcus -Blood cultures on 06/16/2018 negative  -Continue IV ceftriaxone, vancomycin, ampicillin started 06/17/2018 -Appreciate infectious disease recommendations -Disposition is difficult as patient is IV drug user and she will need long-term IV antibiotics. Will remain inpatient through 08/03/2018 for IV antibiotic tx.   Pregnancy: Patient is approximately [redacted] weeks pregnant. -OB following, appreciate recommendations  Influenza B -Patient was presented with influenza B infection and  completed a course of oseltamivir  Polysubstance abuse -Continue methadone   Dental caries -Orthopantogram completed which revealed periapical luncency right mandicular molar tooth compatible with abscess, dental caries. Spoke with Dr. Enrique Sack, planning for evaluation next week and dental extraction under general anesthesia. Spoke with OB on call, they're ok with patient undergoing general anesthesia for this necessary procedure.   Dysuria -UA negative for infection. On antibiotics as above regardless.    DVT prophylaxis: Lovenox Code Status: Full code Family Communication: No family at bedside Disposition Plan: Complete IV antibiotics inpatient until 08/03/2018   Consultants:   Cardiothoracic surgery  Infectious disease  Dental   OB GYN   Antimicrobials:  Anti-infectives (From admission, onward)   Start     Dose/Rate Route Frequency Ordered Stop   06/26/18 0100  vancomycin (VANCOCIN) IVPB 1000 mg/200 mL premix     1,000 mg 200 mL/hr over 60 Minutes Intravenous Every 8 hours 06/25/18 1758 08/04/18 0159   06/23/18 0000  vancomycin (VANCOCIN) 1,250 mg in sodium chloride 0.9 % 250 mL IVPB  Status:  Discontinued     1,250 mg 166.7 mL/hr over 90 Minutes Intravenous Every 8 hours 06/22/18 2332 06/25/18 1758   06/20/18 1000  vancomycin (VANCOCIN) 2,000 mg in sodium chloride 0.9 % 500 mL IVPB  Status:  Discontinued     2,000 mg 250 mL/hr over 120 Minutes Intravenous Every 12 hours 06/19/18 2221 06/22/18 2332   06/18/18 2000  vancomycin (VANCOCIN) IVPB 1000 mg/200 mL premix  Status:  Discontinued     1,000 mg 200 mL/hr over 60 Minutes Intravenous Every 8 hours 06/18/18 1054 06/19/18 2221   06/18/18 1200  vancomycin (VANCOCIN) 1,750 mg in sodium chloride 0.9 % 500 mL IVPB     1,750 mg 250 mL/hr  over 120 Minutes Intravenous  Once 06/18/18 1054 06/18/18 2241   06/17/18 2200  cefTRIAXone (ROCEPHIN) 2 g in sodium chloride 0.9 % 100 mL IVPB     2 g 200 mL/hr over 30 Minutes  Intravenous Every 12 hours 06/17/18 1046 08/04/18 0959   06/17/18 1200  ampicillin (OMNIPEN) 2 g in sodium chloride 0.9 % 100 mL IVPB     2 g 300 mL/hr over 20 Minutes Intravenous Every 4 hours 06/17/18 1045 08/03/18 2359   06/16/18 1300  oseltamivir (TAMIFLU) capsule 75 mg     75 mg Oral 2 times daily 06/16/18 1231 06/20/18 0919   06/15/18 0700  vancomycin (VANCOCIN) IVPB 1000 mg/200 mL premix  Status:  Discontinued     1,000 mg 200 mL/hr over 60 Minutes Intravenous Every 8 hours 06/14/18 2216 06/15/18 0711   06/15/18 0500  cefTRIAXone (ROCEPHIN) 2 g in sodium chloride 0.9 % 100 mL IVPB  Status:  Discontinued     2 g 200 mL/hr over 30 Minutes Intravenous Every 24 hours 06/15/18 0445 06/17/18 1046   06/14/18 2200  azithromycin (ZITHROMAX) 500 mg in sodium chloride 0.9 % 250 mL IVPB  Status:  Discontinued     500 mg 250 mL/hr over 60 Minutes Intravenous Every 24 hours 06/14/18 2134 06/15/18 0711   06/14/18 2200  cefTRIAXone (ROCEPHIN) 1 g in sodium chloride 0.9 % 100 mL IVPB  Status:  Discontinued     1 g 200 mL/hr over 30 Minutes Intravenous Every 24 hours 06/14/18 2134 06/15/18 0445   06/14/18 2200  vancomycin (VANCOCIN) 1,500 mg in sodium chloride 0.9 % 500 mL IVPB     1,500 mg 250 mL/hr over 120 Minutes Intravenous  Once 06/14/18 2134 06/15/18 0235       Objective: Vitals:   06/26/18 0823 06/26/18 1801 06/26/18 2343 06/27/18 0747  BP: (!) 138/94 (!) 124/92 (!) 121/95 (!) 126/100  Pulse: 79 79 76 76  Resp:   18 18  Temp: 98.1 F (36.7 C) 97.8 F (36.6 C) 97.9 F (36.6 C) 97.7 F (36.5 C)  TempSrc: Oral Oral Oral Oral  SpO2: 95% 96% 96% 96%  Weight:      Height:        Intake/Output Summary (Last 24 hours) at 06/27/2018 1035 Last data filed at 06/27/2018 6948 Gross per 24 hour  Intake 2840.32 ml  Output 2025 ml  Net 815.32 ml   Filed Weights   06/21/18 0453 06/24/18 0500 06/25/18 0500  Weight: 88.1 kg 90.3 kg 91.5 kg    Examination: General exam: Appears calm and  comfortable  ENT: Poor dentition  Respiratory system: Clear to auscultation. Respiratory effort normal. Cardiovascular system: S1 & S2 heard, RRR. No JVD, murmurs, rubs, gallops or clicks. No pedal edema. Gastrointestinal system: Abdomen is nondistended, soft and nontender. Gravid fundus  Central nervous system: Alert and oriented. No focal neurological deficits. Extremities: Symmetric 5 x 5 power. Skin: No rashes, lesions or ulcers Psychiatry: Judgement and insight appear normal. Mood & affect appropriate.    Data Reviewed: I have personally reviewed following labs and imaging studies  CBC: Recent Labs  Lab 06/20/18 2044 06/21/18 0500 06/22/18 0500 06/23/18 0601 06/24/18 0552 06/25/18 0553  WBC 11.1* 11.2* 13.5* 15.0* 14.4* 11.4*  NEUTROABS 8.3* 8.0* 10.7* 12.1* 11.2*  --   HGB 8.0* 7.9* 7.6* 7.4* 7.5* 7.2*  HCT 26.3* 26.2* 25.2* 23.8* 24.1* 23.9*  MCV 91.3 91.6 92.0 91.5 90.9 91.2  PLT 302 307 291 306 344 356  Basic Metabolic Panel: Recent Labs  Lab 06/21/18 0500 06/23/18 1625 06/24/18 0552 06/25/18 0553  NA  --  134* 133* 133*  K  --  4.3 4.2 3.7  CL  --  98 97* 99  CO2  --  26 24 27   GLUCOSE  --  87 82 94  BUN  --  <5* <5* <5*  CREATININE 0.46 0.46 0.43* 0.48  CALCIUM  --  8.1* 8.3* 8.0*  MG  --   --   --  1.7   GFR: Estimated Creatinine Clearance: 116.2 mL/min (by C-G formula based on SCr of 0.48 mg/dL). Liver Function Tests: Recent Labs  Lab 06/23/18 1625 06/25/18 0553  AST 14* 15  ALT 6 6  ALKPHOS 181* 166*  BILITOT 1.1 0.7  PROT 6.5 6.0*  ALBUMIN 1.6* 1.4*   No results for input(s): LIPASE, AMYLASE in the last 168 hours. No results for input(s): AMMONIA in the last 168 hours. Coagulation Profile: No results for input(s): INR, PROTIME in the last 168 hours. Cardiac Enzymes: No results for input(s): CKTOTAL, CKMB, CKMBINDEX, TROPONINI in the last 168 hours. BNP (last 3 results) No results for input(s): PROBNP in the last 8760  hours. HbA1C: No results for input(s): HGBA1C in the last 72 hours. CBG: Recent Labs  Lab 06/21/18 0733 06/23/18 0736 06/25/18 0744 06/26/18 0821 06/27/18 0746  GLUCAP 85 104* 75 121* 74   Lipid Profile: No results for input(s): CHOL, HDL, LDLCALC, TRIG, CHOLHDL, LDLDIRECT in the last 72 hours. Thyroid Function Tests: No results for input(s): TSH, T4TOTAL, FREET4, T3FREE, THYROIDAB in the last 72 hours. Anemia Panel: No results for input(s): VITAMINB12, FOLATE, FERRITIN, TIBC, IRON, RETICCTPCT in the last 72 hours. Sepsis Labs: No results for input(s): PROCALCITON, LATICACIDVEN in the last 168 hours.  Recent Results (from the past 240 hour(s))  Culture, Urine     Status: None   Collection Time: 06/26/18 11:30 AM  Result Value Ref Range Status   Specimen Description URINE, CLEAN CATCH  Final   Special Requests NONE  Final   Culture   Final    NO GROWTH Performed at [facility], [address], [place], [place] [address]    Report Status 06/27/2018 FINAL  Final       Radiology Studies: Dg Orthopantogram  Result Date: 06/25/2018 CLINICAL DATA:  33 year old female with a history of abscess EXAM: ORTHOPANTOGRAM/PANORAMIC COMPARISON:  None. FINDINGS: Periapical lucency of the right mandibular last molar tooth. Multiple lucencies of mandibular and maxillary T compatible with dental caries Dental amalgams. No displaced fracture. IMPRESSION: Periapical lucency the right mandibular last molar tooth compatible with abscess. Dental caries. Electronically Signed   By: Corrie Mckusick D.O.   On: 06/25/2018 19:26      Scheduled Meds: . enoxaparin (LOVENOX) injection  40 mg Subcutaneous Daily  . feeding supplement (ENSURE ENLIVE)  237 mL Oral BID BM  . methadone  40 mg Oral q1800  . methadone  60 mg Oral Daily  . prenatal multivitamin  1 tablet Oral Q1200  . sodium chloride flush  10-40 mL Intracatheter Q12H  . sodium chloride flush  3 mL Intravenous Q12H   Continuous  Infusions: . ampicillin (OMNIPEN) IV 2 g (06/27/18 0805)  . cefTRIAXone (ROCEPHIN)  IV 2 g (06/27/18 0910)  . vancomycin 1,000 mg (06/27/18 0914)     LOS: 12 days    Time spent: 25 minutes   Dessa Phi, DO [facility] [email or web address] 06/27/2018, 10:35 AM

## 2018-06-27 NOTE — Consult Note (Signed)
Faculty Practice OB/GYN Attending Dental Procedure Clearance Note  Our service was called today to ensure that patient is obstetrically cleared to undergo a dental procedure. Briefly, patient is a 33 y.o. [id number] at 25w6dadmitted for polymicrobial tricuspid valve endocarditis in the setting of known IVDA, and multiple other medical issues as listed below.   Principal Problem:   Bacteremia due to Enterococcus Active Problems:   Opioid use disorder, severe, dependence (HCC)   Sepsis due to pneumonia ([place]   IV drug abuse complicating pregnancy ([place]   Multifocal pneumonia   Hyponatremia   Elevated transaminase level   Degenerative arthritis of lumbar spine   History of bacterial endocarditis   Severe tricuspid regurgitation by prior echocardiogram   Influenza B   Rubella non-immune status, antepartum   High-risk pregnancy in second trimester  From an obstetric standpoint, patient is has a previable fetus and needs no fetal monitoring during any dental procedure.  She may have all the usual dental procedures as if she were not pregnant including but not limited to the use of shielded dental X-rays and local infiltration anesthesia with Novocaine with or without Epinephrine.  General or regional anesthesia can also be administered if needed.  She may have the usual narcotic analgesia and antibiotics as needed or indicated before, during and after the procedure.  Please call [phone](OB/GYN Attending on call) for any further obstetric concerns.     UVerita Schneiders MD, [place]for [facility] [place]

## 2018-06-28 LAB — GLUCOSE, CAPILLARY: Glucose-Capillary: 71 mg/dL (ref 70–99)

## 2018-06-28 NOTE — Progress Notes (Signed)
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  New events last 24 hours / Subjective: Remains stable. No acute issues.   Assessment & Plan:   Principal Problem:   Bacteremia due to Enterococcus Active Problems:   Opioid use disorder, severe, dependence (HCC)   Sepsis due to pneumonia ([place])   IV drug abuse complicating pregnancy ([place])   Multifocal pneumonia   Hyponatremia   Elevated transaminase level   Degenerative arthritis of lumbar spine   History of bacterial endocarditis   Severe tricuspid regurgitation by prior echocardiogram   Influenza B   Rubella non-immune status, antepartum   High-risk pregnancy in second trimester  Sepsis due to tricuspid valve endocarditis due to enterococcus faecalis, Streptococcus sanguinous and mitis -Likely secondary to IV drug abuse.  She has evidence of severe tricuspid regurgitation on imaging. -Blood cultures on 06/14/2018 growing out enterococcus, 2 species of Streptococcus -Blood cultures on 06/16/2018 negative  -Continue IV ceftriaxone, vancomycin, ampicillin started 06/17/2018 -Appreciate infectious disease recommendations -Disposition is difficult as patient is IV drug user and she will need long-term IV antibiotics. Will remain inpatient through 08/03/2018 for IV antibiotic tx.   Pregnancy: Patient is [redacted] weeks pregnant. -OB following, appreciate recommendations  Influenza B -Patient was  presented with influenza B infection and completed a course of oseltamivir. Discontinue droplet precautions.   Polysubstance abuse -Continue methadone   Dental caries -Orthopantogram completed which revealed periapical luncency right mandicular molar tooth compatible with abscess, dental caries. Spoke with Dr. Enrique Sack, planning for evaluation next week and dental extraction under general anesthesia. Cleared from OB perspective.   Dysuria -UA negative for infection. On antibiotics as above regardless.    DVT prophylaxis: Lovenox Code Status: Full code Family Communication: No family at bedside Disposition Plan: Complete IV antibiotics inpatient until 08/03/2018   Consultants:   Cardiothoracic surgery  Infectious disease  Dental   OB GYN   Antimicrobials:  Anti-infectives (From admission, onward)   Start     Dose/Rate Route Frequency Ordered Stop   06/26/18 0100  vancomycin (VANCOCIN) IVPB 1000 mg/200 mL premix     1,000 mg 200 mL/hr over 60 Minutes Intravenous Every 8 hours 06/25/18 1758 08/04/18 0159   06/23/18 0000  vancomycin (VANCOCIN) 1,250 mg in sodium chloride 0.9 % 250 mL IVPB  Status:  Discontinued     1,250 mg 166.7 mL/hr over 90 Minutes Intravenous Every 8 hours 06/22/18 2332 06/25/18 1758   06/20/18 1000  vancomycin (VANCOCIN) 2,000 mg in sodium chloride 0.9 % 500 mL IVPB  Status:  Discontinued     2,000 mg 250 mL/hr over 120 Minutes Intravenous Every 12 hours 06/19/18 2221 06/22/18 2332   06/18/18 2000  vancomycin (VANCOCIN) IVPB 1000 mg/200 mL premix  Status:  Discontinued     1,000 mg 200 mL/hr over 60 Minutes Intravenous Every 8 hours 06/18/18 1054 06/19/18 2221   06/18/18 1200  vancomycin (VANCOCIN) 1,750 mg in sodium chloride 0.9 % 500 mL IVPB     1,750 mg 250 mL/hr over  120 Minutes Intravenous  Once 06/18/18 1054 06/18/18 2241   06/17/18 2200  cefTRIAXone (ROCEPHIN) 2 g in sodium chloride 0.9 % 100 mL IVPB     2 g 200 mL/hr over 30 Minutes  Intravenous Every 12 hours 06/17/18 1046 08/04/18 0959   06/17/18 1200  ampicillin (OMNIPEN) 2 g in sodium chloride 0.9 % 100 mL IVPB     2 g 300 mL/hr over 20 Minutes Intravenous Every 4 hours 06/17/18 1045 08/03/18 2359   06/16/18 1300  oseltamivir (TAMIFLU) capsule 75 mg     75 mg Oral 2 times daily 06/16/18 1231 06/20/18 0919   06/15/18 0700  vancomycin (VANCOCIN) IVPB 1000 mg/200 mL premix  Status:  Discontinued     1,000 mg 200 mL/hr over 60 Minutes Intravenous Every 8 hours 06/14/18 2216 06/15/18 0711   06/15/18 0500  cefTRIAXone (ROCEPHIN) 2 g in sodium chloride 0.9 % 100 mL IVPB  Status:  Discontinued     2 g 200 mL/hr over 30 Minutes Intravenous Every 24 hours 06/15/18 0445 06/17/18 1046   06/14/18 2200  azithromycin (ZITHROMAX) 500 mg in sodium chloride 0.9 % 250 mL IVPB  Status:  Discontinued     500 mg 250 mL/hr over 60 Minutes Intravenous Every 24 hours 06/14/18 2134 06/15/18 0711   06/14/18 2200  cefTRIAXone (ROCEPHIN) 1 g in sodium chloride 0.9 % 100 mL IVPB  Status:  Discontinued     1 g 200 mL/hr over 30 Minutes Intravenous Every 24 hours 06/14/18 2134 06/15/18 0445   06/14/18 2200  vancomycin (VANCOCIN) 1,500 mg in sodium chloride 0.9 % 500 mL IVPB     1,500 mg 250 mL/hr over 120 Minutes Intravenous  Once 06/14/18 2134 06/15/18 0235       Objective: Vitals:   06/27/18 0747 06/27/18 1643 06/27/18 2315 06/28/18 0750  BP: (!) 126/100 119/89 124/85 (!) 118/98  Pulse: 76 75 75 78  Resp: 18 19  16   Temp: 97.7 F (36.5 C) 98 F (36.7 C) 98.3 F (36.8 C) 97.8 F (36.6 C)  TempSrc: Oral Oral Oral Oral  SpO2: 96% 96% 96% 97%  Weight:      Height:        Intake/Output Summary (Last 24 hours) at 06/28/2018 1023 Last data filed at 06/28/2018 0755 Gross per 24 hour  Intake 550 ml  Output 1775 ml  Net -1225 ml   Filed Weights   06/21/18 0453 06/24/18 0500 06/25/18 0500  Weight: 88.1 kg 90.3 kg 91.5 kg    Examination: General exam: Appears calm and comfortable    Respiratory system: Clear to auscultation. Respiratory effort normal. Cardiovascular system: S1 & S2 heard, RRR. No pedal edema. Gastrointestinal system: Abdomen is nondistended, soft. Gravid uterus.  Central nervous system: Alert and oriented. No focal neurological deficits. Extremities: Symmetric Skin: No rashes, lesions or ulcers Psychiatry: Judgement and insight appear normal. Mood & affect appropriate.    Data Reviewed: I have personally reviewed following labs and imaging studies  CBC: Recent Labs  Lab 06/22/18 0500 06/23/18 0601 06/24/18 0552 06/25/18 0553  WBC 13.5* 15.0* 14.4* 11.4*  NEUTROABS 10.7* 12.1* 11.2*  --   HGB 7.6* 7.4* 7.5* 7.2*  HCT 25.2* 23.8* 24.1* 23.9*  MCV 92.0 91.5 90.9 91.2  PLT 291 306 344 956   Basic Metabolic Panel: Recent Labs  Lab 06/23/18 1625 06/24/18 0552 06/25/18 0553  NA 134* 133* 133*  K 4.3 4.2 3.7  CL 98 97* 99  CO2 26 24 27   GLUCOSE  87 82 94  BUN <5* <5* <5*  CREATININE 0.46 0.43* 0.48  CALCIUM 8.1* 8.3* 8.0*  MG  --   --  1.7   GFR: Estimated Creatinine Clearance: 116.2 mL/min (by C-G formula based on SCr of 0.48 mg/dL). Liver Function Tests: Recent Labs  Lab 06/23/18 1625 06/25/18 0553  AST 14* 15  ALT 6 6  ALKPHOS 181* 166*  BILITOT 1.1 0.7  PROT 6.5 6.0*  ALBUMIN 1.6* 1.4*   No results for input(s): LIPASE, AMYLASE in the last 168 hours. No results for input(s): AMMONIA in the last 168 hours. Coagulation Profile: No results for input(s): INR, PROTIME in the last 168 hours. Cardiac Enzymes: No results for input(s): CKTOTAL, CKMB, CKMBINDEX, TROPONINI in the last 168 hours. BNP (last 3 results) No results for input(s): PROBNP in the last 8760 hours. HbA1C: No results for input(s): HGBA1C in the last 72 hours. CBG: Recent Labs  Lab 06/23/18 0736 06/25/18 0744 06/26/18 0821 06/27/18 0746 06/28/18 0751  GLUCAP 104* 75 121* 74 71   Lipid Profile: No results for input(s): CHOL, HDL, LDLCALC, TRIG,  CHOLHDL, LDLDIRECT in the last 72 hours. Thyroid Function Tests: No results for input(s): TSH, T4TOTAL, FREET4, T3FREE, THYROIDAB in the last 72 hours. Anemia Panel: No results for input(s): VITAMINB12, FOLATE, FERRITIN, TIBC, IRON, RETICCTPCT in the last 72 hours. Sepsis Labs: No results for input(s): PROCALCITON, LATICACIDVEN in the last 168 hours.  Recent Results (from the past 240 hour(s))  Culture, Urine     Status: None   Collection Time: 06/26/18 11:30 AM  Result Value Ref Range Status   Specimen Description URINE, CLEAN CATCH  Final   Special Requests NONE  Final   Culture   Final    NO GROWTH Performed at [facility], [address] [place], [place] [address]    Report Status 06/27/2018 FINAL  Final       Radiology Studies: No results found.    Scheduled Meds: . enoxaparin (LOVENOX) injection  40 mg Subcutaneous Daily  . feeding supplement (ENSURE ENLIVE)  237 mL Oral BID BM  . methadone  40 mg Oral q1800  . methadone  60 mg Oral Daily  . prenatal multivitamin  1 tablet Oral Q1200  . sodium chloride flush  10-40 mL Intracatheter Q12H  . sodium chloride flush  3 mL Intravenous Q12H   Continuous Infusions: . ampicillin (OMNIPEN) IV 2 g (06/28/18 0757)  . cefTRIAXone (ROCEPHIN)  IV 2 g (06/28/18 1004)  . vancomycin 1,000 mg (06/28/18 1004)     LOS: 13 days    Time spent: 25 minutes   Dessa Phi, DO [facility] [email or web address] 06/28/2018, 10:23 AM

## 2018-06-28 NOTE — Progress Notes (Signed)
Pharmacy Antibiotic Note  Kelly [name] is a 33 y.o. female admitted on 06/14/2018 with strep and enterococcal endocarditis.  Patient has been on ampicillin and ceftriaxone for the Enterococcus isolated in her bloodstream but unfortunately her Strep sanguinis species has come back as resistant to ceftriaxone and penicillin, so vancomycin was added.   We will repeat vanc levels in a few days.   Plan: Cont vanc 1gm IV Q8H Continue ampicillin and Rocephin per MD Monitor renal fxn, repeat vanc AUC as appropriate   Height: 5' 7"  (170.2 cm) Weight: 201 lb 11.5 oz (91.5 kg) IBW/kg (Calculated) : 61.6  Temp (24hrs), Avg:98 F (36.7 C), Min:97.8 F (36.6 C), Max:98.3 F (36.8 C)  Recent Labs  Lab 06/22/18 0500 06/22/18 1607 06/22/18 2212 06/23/18 0601 06/23/18 1625 06/24/18 0552 06/25/18 0553 06/25/18 1145 06/25/18 1530  WBC 13.5*  --   --  15.0*  --  14.4* 11.4*  --   --   CREATININE  --   --   --   --  0.46 0.43* 0.48  --   --   VANCOTROUGH  --   --  9*  --   --   --   --   --  14*  VANCOPEAK  --  34  --   --   --   --   --  30  --     Estimated Creatinine Clearance: 116.2 mL/min (by C-G formula based on SCr of 0.48 mg/dL).    Allergies  Allergen Reactions  . Hydrocodone Itching  . Morphine And Related Nausea And Vomiting    Azithromycin 2/1>>2/2 Vancomcyin 2/1>>2/2, 2/5>> Ceftriaxone 2/1>> Ampicillin 2/4>> Tamiflu 2/3>>2/7  2/6: Levels 19/11 (5 hours apart) -  on 1gm q8hh - AUC low at 389 on this dose >> dose adjusted to 2g IV q12h for estimated AUC 517 2/9: Levels 39/9 (12 hrs apart) - on Vanc 2gm IV q12h - >AUC supratherapeutic at 597 (goal 400-550) > adjusted to 1250 mg IV q8h for estimated AUC 556 - Calculated Ke 0.2185, half-life 3.2 hrs 2/12: VP/VT 30/14, AUC 577 on 1220m q8 >> 1g q8  2/1Blood x 2: Enterococcus faecalis (pan-S), Strep sanguinis 1/2 (R-PCN and CTX, S-Vanc) and strep mitis/oralis (R-tetra, I-PCN, S-Vanc/Clinda/CTX) 2/1Urine: E coli -  sensitive to cefazolin, ceftriaxone; R - amp, cipro, septra, unasyn, zosyn 2/3 Influenza PCR - positive B 2/3 Blood x 2: negative 2/7 RPR negative 2/7 HIV non-reactive 2/7 Rubella < 0.90 = non-immune  MOnnie Boer PharmD, [place] AAHIVP, CPP Infectious Disease Pharmacist 06/28/2018 2:04 PM

## 2018-06-29 DIAGNOSIS — O10919 Unspecified pre-existing hypertension complicating pregnancy, unspecified trimester: Secondary | ICD-10-CM | POA: Diagnosis present

## 2018-06-29 LAB — COMPREHENSIVE METABOLIC PANEL
ALBUMIN: 1.5 g/dL — AB (ref 3.5–5.0)
ALT: 12 U/L (ref 0–44)
AST: 35 U/L (ref 15–41)
Alkaline Phosphatase: 240 U/L — ABNORMAL HIGH (ref 38–126)
Anion gap: 11 (ref 5–15)
BUN: <5 mg/dL — ABNORMAL LOW (ref 6–20)
CO2: 26 mmol/L (ref 22–32)
Calcium: 8.4 mg/dL — ABNORMAL LOW (ref 8.9–10.3)
Chloride: 96 mmol/L — ABNORMAL LOW (ref 98–111)
Creatinine, Ser: 0.45 mg/dL (ref 0.44–1.00)
GFR calc Af Amer: >60 mL/min (ref >60)
GFR calc non Af Amer: >60 mL/min (ref >60)
GLUCOSE: 74 mg/dL (ref 70–99)
Potassium: 4.3 mmol/L (ref 3.5–5.1)
Sodium: 133 mmol/L — ABNORMAL LOW (ref 135–145)
Total Bilirubin: 1.2 mg/dL (ref 0.3–1.2)
Total Protein: 6.7 g/dL (ref 6.5–8.1)

## 2018-06-29 LAB — BASIC METABOLIC PANEL
Anion gap: 10 (ref 5–15)
BUN: <5 mg/dL — ABNORMAL LOW (ref 6–20)
CO2: 27 mmol/L (ref 22–32)
Calcium: 8.4 mg/dL — ABNORMAL LOW (ref 8.9–10.3)
Chloride: 95 mmol/L — ABNORMAL LOW (ref 98–111)
Creatinine, Ser: 0.42 mg/dL — ABNORMAL LOW (ref 0.44–1.00)
GFR calc non Af Amer: >60 mL/min (ref >60)
Glucose, Bld: 77 mg/dL (ref 70–99)
Potassium: 4.2 mmol/L (ref 3.5–5.1)
Sodium: 132 mmol/L — ABNORMAL LOW (ref 135–145)

## 2018-06-29 LAB — GLUCOSE, CAPILLARY
Glucose-Capillary: 64 mg/dL — ABNORMAL LOW (ref 70–99)
Glucose-Capillary: 85 mg/dL (ref 70–99)

## 2018-06-29 NOTE — Progress Notes (Signed)
[facility] OB/GYN Attending Follow up Note  No significant obstetric concerns reported.      Patient Vitals for the past 24 hrs:  BP Temp Temp src Pulse Resp SpO2  06/29/18 0748 (!) 140/97 97.9 F (36.6 C) Oral 80 18 99 %  06/28/18 2321 (!) 125/96 98.1 F (36.7 C) Oral 81 16 96 %  06/28/18 1642 (!) 131/94 97.9 F (36.6 C) Oral 79 16 96 %   On review of previous BP since around 06/19/2018 (normotensive after admission on 06/14/2018), patient's DBP is 90-100 most of the time with occasional SBP in 140s.   No preeclampsia symptoms reported.  She had a urine protein:creatinine ratio of 0.59 on 06/23/18, but negative protein on urinalysis done 06/26/18.    Concerned about preexisting hypertension; on review of notes when she is not pregnant, she did have a few instances of DBP in 90s during ED visits; she also had postpartum HTN after delivery in 2018.   This does give her a diagnosis of chronic hypertension, but no antihypertensive intervention is needed unless BP is persistently >150s/100s or if she has signs of superimposed preeclampsia. No need for any antenatal testing at this point (not usually needed until 32 weeks for preexisting hypertension).  Of note, there is also a possibility her HTN could be a consequence of her cocaine abuse.  To evaluate for possibility of superimposed preeclampsia; will recheck CMET (normal platelets on recent CBC) and check 24 hour urine protein and creatinine (gold standard for diagnosis of proteinuria).  Discussed this with her covering [facility] Physician (Dr. Dessa Phi) and her nurse who will help patient start the 24 hour urine collection today. Will follow up results and manage accordingly.  CBC Latest Ref Rng & Units 06/25/2018 06/24/2018 06/23/2018  WBC 4.0 - 10.5 K/uL 11.4(H) 14.4(H) 15.0(H)  Hemoglobin 12.0 - 15.0 g/dL 7.2(L) 7.5(L) 7.4(L)  Hematocrit 36.0 - 46.0 % 23.9(L) 24.1(L) 23.8(L)  Platelets 150 - 400 K/uL 356 344 306   CMP Latest Ref  Rng & Units 06/29/2018 06/25/2018 06/24/2018  Glucose 70 - 99 mg/dL 77 94 82  BUN 6 - 20 mg/dL <5(L) <5(L) <5(L)  Creatinine 0.44 - 1.00 mg/dL 0.42(L) 0.48 0.43(L)  Sodium 135 - 145 mmol/L 132(L) 133(L) 133(L)  Potassium 3.5 - 5.1 mmol/L 4.2 3.7 4.2  Chloride 98 - 111 mmol/L 95(L) 99 97(L)  CO2 22 - 32 mmol/L 27 27 24   Calcium 8.9 - 10.3 mg/dL 8.4(L) 8.0(L) 8.3(L)  Total Protein 6.5 - 8.1 g/dL - 6.0(L) -  Total Bilirubin 0.3 - 1.2 mg/dL - 0.7 -  Alkaline Phos 38 - 126 U/L - 166(H) -  AST 15 - 41 U/L - 15 -  ALT 0 - 44 U/L - 6 -   We will follow up results and manage accordingly. No other current obstetric issues.  Appreciate the continued management of her medical issues as per primary team and consultation services.   We will continue to follow along; will continue to monitor FHR about twice a week given gestation/ fetal previability.   Please call [phone] (OB/GYN Attending on call) for any concerns.    Verita Schneiders, MD, [place] for [employer], [place]

## 2018-06-29 NOTE — Progress Notes (Signed)
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  New events last 24 hours / Subjective: No new complaints   Assessment & Plan:   Principal Problem:   Bacteremia due to Enterococcus Active Problems:   Opioid use disorder, severe, dependence (HCC)   Sepsis due to pneumonia ([place])   IV drug abuse complicating pregnancy ([place])   Multifocal pneumonia   Hyponatremia   Elevated transaminase level   Degenerative arthritis of lumbar spine   History of bacterial endocarditis   Severe tricuspid regurgitation by prior echocardiogram   Influenza B   Rubella non-immune status, antepartum   High-risk pregnancy in second trimester   Preexisting hypertension complicating pregnancy, antepartum  Sepsis due to tricuspid valve endocarditis due to enterococcus faecalis, Streptococcus sanguinous and mitis -Likely secondary to IV drug abuse.  She has evidence of severe tricuspid regurgitation on imaging. -Blood cultures on 06/14/2018 growing out enterococcus, 2 species of Streptococcus -Blood cultures on 06/16/2018 negative  -Continue IV ceftriaxone, vancomycin, ampicillin started 06/17/2018 -Appreciate infectious disease recommendations -Disposition is difficult as patient is IV drug user and she will need long-term IV antibiotics. Will remain inpatient through 08/03/2018 for IV antibiotic tx.   Pregnancy: Patient is [redacted] weeks pregnant. -OB following, appreciate  recommendations -Spoke with Dr. Harolyn Rutherford for work up for preeclampsia, check 24 hour urine protein and Cr. Discussed with patient who was in agreement with urine collection today   Influenza B -Patient was presented with influenza B infection and completed a course of oseltamivir. Discontinue droplet precautions.   Polysubstance abuse -Continue methadone   Dental caries -Orthopantogram completed which revealed periapical luncency right mandicular molar tooth compatible with abscess, dental caries. Spoke with Dr. Enrique Sack, planning for evaluation next week and dental extraction under general anesthesia. Cleared from OB perspective.     DVT prophylaxis: Lovenox Code Status: Full code Family Communication: No family at bedside Disposition Plan: Complete IV antibiotics inpatient until 08/03/2018   Consultants:   Cardiothoracic surgery  Infectious disease  Dental   OB GYN   Antimicrobials:  Anti-infectives (From admission, onward)   Start     Dose/Rate Route Frequency Ordered Stop   06/26/18 0100  vancomycin (VANCOCIN) IVPB 1000 mg/200 mL premix     1,000 mg 200 mL/hr over 60 Minutes Intravenous Every 8 hours 06/25/18 1758 08/04/18 0159   06/23/18 0000  vancomycin (VANCOCIN) 1,250 mg in sodium chloride 0.9 % 250 mL IVPB  Status:  Discontinued     1,250 mg 166.7 mL/hr over 90 Minutes Intravenous Every 8 hours 06/22/18 2332 06/25/18 1758   06/20/18 1000  vancomycin (VANCOCIN) 2,000 mg in sodium chloride 0.9 % 500 mL IVPB  Status:  Discontinued     2,000 mg 250 mL/hr over 120 Minutes Intravenous Every 12 hours 06/19/18 2221 06/22/18 2332   06/18/18 2000  vancomycin (VANCOCIN) IVPB 1000 mg/200 mL premix  Status:  Discontinued     1,000 mg 200 mL/hr over 60 Minutes Intravenous Every 8 hours 06/18/18 1054 06/19/18 2221   06/18/18 1200  vancomycin (VANCOCIN) 1,750 mg in sodium chloride 0.9 % 500 mL IVPB     1,750 mg 250 mL/hr over 120 Minutes Intravenous  Once 06/18/18 1054  06/18/18 2241   06/17/18 2200  cefTRIAXone (ROCEPHIN) 2 g in sodium chloride 0.9 % 100 mL IVPB     2 g 200 mL/hr over 30 Minutes Intravenous Every 12 hours 06/17/18 1046 08/04/18 0959   06/17/18 1200  ampicillin (OMNIPEN) 2 g in sodium chloride 0.9 % 100 mL IVPB     2 g 300 mL/hr over 20 Minutes Intravenous Every 4 hours 06/17/18 1045 08/03/18 2359   06/16/18 1300  oseltamivir (TAMIFLU) capsule 75 mg     75 mg Oral 2 times daily 06/16/18 1231 06/20/18 0919   06/15/18 0700  vancomycin (VANCOCIN) IVPB 1000 mg/200 mL premix  Status:  Discontinued     1,000 mg 200 mL/hr over 60 Minutes Intravenous Every 8 hours 06/14/18 2216 06/15/18 0711   06/15/18 0500  cefTRIAXone (ROCEPHIN) 2 g in sodium chloride 0.9 % 100 mL IVPB  Status:  Discontinued     2 g 200 mL/hr over 30 Minutes Intravenous Every 24 hours 06/15/18 0445 06/17/18 1046   06/14/18 2200  azithromycin (ZITHROMAX) 500 mg in sodium chloride 0.9 % 250 mL IVPB  Status:  Discontinued     500 mg 250 mL/hr over 60 Minutes Intravenous Every 24 hours 06/14/18 2134 06/15/18 0711   06/14/18 2200  cefTRIAXone (ROCEPHIN) 1 g in sodium chloride 0.9 % 100 mL IVPB  Status:  Discontinued     1 g 200 mL/hr over 30 Minutes Intravenous Every 24 hours 06/14/18 2134 06/15/18 0445   06/14/18 2200  vancomycin (VANCOCIN) 1,500 mg in sodium chloride 0.9 % 500 mL IVPB     1,500 mg 250 mL/hr over 120 Minutes Intravenous  Once 06/14/18 2134 06/15/18 0235       Objective: Vitals:   06/28/18 0750 06/28/18 1642 06/28/18 2321 06/29/18 0748  BP: (!) 118/98 (!) 131/94 (!) 125/96 (!) 140/97  Pulse: 78 79 81 80  Resp: 16 16 16 18   Temp: 97.8 F (36.6 C) 97.9 F (36.6 C) 98.1 F (36.7 C) 97.9 F (36.6 C)  TempSrc: Oral Oral Oral Oral  SpO2: 97% 96% 96% 99%  Weight:      Height:        Intake/Output Summary (Last 24 hours) at 06/29/2018 1223 Last data filed at 06/29/2018 0600 Gross per 24 hour  Intake 1570 ml  Output 2200 ml  Net -630 ml   Filed Weights    06/21/18 0453 06/24/18 0500 06/25/18 0500  Weight: 88.1 kg 90.3 kg 91.5 kg    Examination: General exam: Appears calm and comfortable  Respiratory system: Clear to auscultation. Respiratory effort normal. Cardiovascular system: S1 & S2 heard, RRR. No JVD, murmurs, rubs, gallops or clicks. No pedal edema. Gastrointestinal system: Abdomen is nondistended, soft and nontender. No organomegaly or masses felt. Normal bowel sounds heard. Central nervous system: Alert and oriented. No focal neurological deficits. Extremities: Symmetric 5 x 5 power. Skin: No rashes, lesions or ulcers Psychiatry: Judgement and insight appear normal. Mood & affect appropriate.    Data Reviewed: I have personally reviewed following labs and imaging studies  CBC: Recent Labs  Lab 06/23/18 0601 06/24/18 0552 06/25/18 0553  WBC 15.0* 14.4* 11.4*  NEUTROABS 12.1* 11.2*  --   HGB 7.4* 7.5* 7.2*  HCT 23.8* 24.1* 23.9*  MCV 91.5 90.9 91.2  PLT 306 344 903   Basic Metabolic  Panel: Recent Labs  Lab 06/23/18 1625 06/24/18 0552 06/25/18 0553 06/29/18 0509  NA 134* 133* 133* 133*  132*  K 4.3 4.2 3.7 4.3  4.2  CL 98 97* 99 96*  95*  CO2 26 24 27 26  27   GLUCOSE 87 82 94 74  77  BUN <5* <5* <5* <5*  <5*  CREATININE 0.46 0.43* 0.48 0.45  0.42*  CALCIUM 8.1* 8.3* 8.0* 8.4*  8.4*  MG  --   --  1.7  --    GFR: Estimated Creatinine Clearance: 116.2 mL/min (A) (by C-G formula based on SCr of 0.42 mg/dL (L)). Liver Function Tests: Recent Labs  Lab 06/23/18 1625 06/25/18 0553 06/29/18 0509  AST 14* 15 35  ALT 6 6 12   ALKPHOS 181* 166* 240*  BILITOT 1.1 0.7 1.2  PROT 6.5 6.0* 6.7  ALBUMIN 1.6* 1.4* 1.5*   No results for input(s): LIPASE, AMYLASE in the last 168 hours. No results for input(s): AMMONIA in the last 168 hours. Coagulation Profile: No results for input(s): INR, PROTIME in the last 168 hours. Cardiac Enzymes: No results for input(s): CKTOTAL, CKMB, CKMBINDEX, TROPONINI in the  last 168 hours. BNP (last 3 results) No results for input(s): PROBNP in the last 8760 hours. HbA1C: No results for input(s): HGBA1C in the last 72 hours. CBG: Recent Labs  Lab 06/26/18 0821 06/27/18 0746 06/28/18 0751 06/29/18 0733 06/29/18 0855  GLUCAP 121* 74 71 64* 85   Lipid Profile: No results for input(s): CHOL, HDL, LDLCALC, TRIG, CHOLHDL, LDLDIRECT in the last 72 hours. Thyroid Function Tests: No results for input(s): TSH, T4TOTAL, FREET4, T3FREE, THYROIDAB in the last 72 hours. Anemia Panel: No results for input(s): VITAMINB12, FOLATE, FERRITIN, TIBC, IRON, RETICCTPCT in the last 72 hours. Sepsis Labs: No results for input(s): PROCALCITON, LATICACIDVEN in the last 168 hours.  Recent Results (from the past 240 hour(s))  Culture, Urine     Status: None   Collection Time: 06/26/18 11:30 AM  Result Value Ref Range Status   Specimen Description URINE, CLEAN CATCH  Final   Special Requests NONE  Final   Culture   Final    NO GROWTH Performed at [facility], [address], [place], [place] [address]    Report Status 06/27/2018 FINAL  Final       Radiology Studies: No results found.    Scheduled Meds: . enoxaparin (LOVENOX) injection  40 mg Subcutaneous Daily  . feeding supplement (ENSURE ENLIVE)  237 mL Oral BID BM  . methadone  40 mg Oral q1800  . methadone  60 mg Oral Daily  . prenatal multivitamin  1 tablet Oral Q1200  . sodium chloride flush  10-40 mL Intracatheter Q12H  . sodium chloride flush  3 mL Intravenous Q12H   Continuous Infusions: . ampicillin (OMNIPEN) IV 2 g (06/29/18 1139)  . cefTRIAXone (ROCEPHIN)  IV 2 g (06/29/18 1002)  . vancomycin 1,000 mg (06/29/18 1142)     LOS: 14 days    Time spent: 25 minutes   Dessa Phi, DO [facility] [email or web address] 06/29/2018, 12:23 PM

## 2018-06-30 ENCOUNTER — Encounter (HOSPITAL_COMMUNITY): Payer: Self-pay | Admitting: Dentistry

## 2018-06-30 LAB — CREATININE, URINE, 24 HOUR
Collection Interval-UCRE24: 24 hours
Creatinine, 24H Ur: 968 mg/d (ref 600–1800)
Creatinine, Urine: 20.17 mg/dL
Urine Total Volume-UCRE24: 4800 mL

## 2018-06-30 LAB — PROTEIN, URINE, 24 HOUR
COLLECTION INTERVAL-UPROT: 24 h
Protein, 24H Urine: 288 mg/d — ABNORMAL HIGH (ref 50–100)
Protein, Urine: 6 mg/dL
Urine Total Volume-UPROT: 4800 mL

## 2018-06-30 LAB — VANCOMYCIN, TROUGH: Vancomycin Tr: 13 ug/mL — ABNORMAL LOW (ref 15–20)

## 2018-06-30 LAB — VANCOMYCIN, PEAK: VANCOMYCIN PK: 19 ug/mL — AB (ref 30–40)

## 2018-06-30 LAB — GLUCOSE, CAPILLARY: Glucose-Capillary: 71 mg/dL (ref 70–99)

## 2018-06-30 NOTE — Progress Notes (Signed)
Pharmacy Antibiotic Note  Kelly [name] is a 33 y.o. female admitted on 06/14/2018 with strep and enterococcal endocarditis.  Patient has been on ampicillin and ceftriaxone for the Enterococcus isolated in her bloodstream but unfortunately her Strep sanguinis species has come back as resistant to ceftriaxone and penicillin, so vancomycin was added.   Vancomycin AUC is therapeutic at 505.6, goal AUC 400-550. Renal function is stable.  Plan: Continue vancomycin 1000 mg IV q8h Continue ampicillin and Rocephin per MD Monitor renal fxn, repeat vanomycinc AUC as appropriate   Height: 5' 7"  (170.2 cm) Weight: 191 lb 9.3 oz (86.9 kg) IBW/kg (Calculated) : 61.6  Temp (24hrs), Avg:97.8 F (36.6 C), Min:97.6 F (36.4 C), Max:98.2 F (36.8 C)  Recent Labs  Lab 06/24/18 0552 06/25/18 0553 06/25/18 1145 06/25/18 1530 06/29/18 0509 06/30/18 1430 06/30/18 1740  WBC 14.4* 11.4*  --   --   --   --   --   CREATININE 0.43* 0.48  --   --  0.45  0.42*  --   --   VANCOTROUGH  --   --   --  14*  --   --  13*  VANCOPEAK  --   --  30  --   --  19*  --     Estimated Creatinine Clearance: 113.2 mL/min (A) (by C-G formula based on SCr of 0.42 mg/dL (L)).    Allergies  Allergen Reactions  . Hydrocodone Itching  . Morphine And Related Nausea And Vomiting    Azithromycin 2/1>>2/2 Vancomcyin 2/1>>2/2, 2/5>>(3/22) Ceftriaxone 2/1>>(3/22) Ampicillin 2/4>>(3/22) Tamiflu 2/3>>2/7  2/6: Levels 19/11 (5 hours apart) -  on 1gm q8hh - AUC low at 389 on this dose >> dose adjusted to 2g IV q12h for estimated AUC 517 2/9: Levels 39/9 (12 hrs apart) - on Vanc 2gm IV q12h - >AUC supratherapeutic at 597 (goal 400-550) > adjusted to 1250 mg IV q8h for estimated AUC 556 - Calculated Ke 0.2185, half-life 3.2 hrs 2/12: VP/VT 30/14, AUC 577 on 1258m q8 >> 1g q8 2/17: VP/VT 19/13, AUC 505.6 on [id number], ke 0.1198, t1/2 5.8h - cont  2/1Blood x 2: Enterococcus faecalis (pan-S), Strep sanguinis 1/2 (R-PCN  and CTX, S-Vanc) and strep mitis/oralis (R-tetra, I-PCN, S-Vanc/Clinda/CTX) 2/1Urine: E coli - sensitive to cefazolin, ceftriaxone; R - amp, cipro, septra, unasyn, zosyn 2/3 Influenza PCR - positive B 2/3 Blood x 2: negative 2/7 RPR negative 2/7 HIV non-reactive 2/7 Rubella < 0.90 = non-immune   JeRenold GentaPharmD, BCPS Clinical Pharmacist Clinical phone for 06/30/2018 until 10p is [phone] 06/30/2018 6:46 PM  **Pharmacist phone directory can now be found on [email or web address] listed under [place]

## 2018-06-30 NOTE — Progress Notes (Signed)
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  New events last 24 hours / Subjective: No acute issues overnight  Assessment & Plan:   Principal Problem:   Bacteremia due to Enterococcus Active Problems:   Opioid use disorder, severe, dependence (HCC)   Sepsis due to pneumonia ([place])   IV drug abuse complicating pregnancy ([place])   Multifocal pneumonia   Hyponatremia   Elevated transaminase level   Degenerative arthritis of lumbar spine   History of bacterial endocarditis   Severe tricuspid regurgitation by prior echocardiogram   Influenza B   Rubella non-immune status, antepartum   High-risk pregnancy in second trimester   Preexisting hypertension complicating pregnancy, antepartum  Sepsis due to tricuspid valve endocarditis due to enterococcus faecalis, Streptococcus sanguinous and mitis -Likely secondary to IV drug abuse.  She has evidence of severe tricuspid regurgitation on imaging. -Blood cultures on 06/14/2018 growing out enterococcus, 2 species of Streptococcus -Blood cultures on 06/16/2018 negative  -Continue IV ceftriaxone, vancomycin, ampicillin started 06/17/2018 -Appreciate infectious disease recommendations -Disposition is difficult as patient is IV drug user and she will need long-term IV antibiotics. Will remain inpatient through 08/03/2018 for IV antibiotic tx.   Pregnancy: Patient is [redacted] weeks pregnant. -OB following,  appreciate recommendations -Spoke with Dr. Harolyn Rutherford for work up for preeclampsia, check 24 hour urine protein and Cr  Influenza B -Patient was presented with influenza B infection and completed a course of oseltamivir. Discontinue droplet precautions.   Polysubstance abuse -Continue methadone   Dental caries -Orthopantogram completed which revealed periapical luncency right mandicular molar tooth compatible with abscess, dental caries. Consulted Dr. Enrique Sack, planning for dental extraction under general anesthesia Thurs 07/03/2018. Cleared from OB perspective.     DVT prophylaxis: Lovenox Code Status: Full code Family Communication: No family at bedside Disposition Plan: Complete IV antibiotics inpatient until 08/03/2018   Consultants:   Cardiothoracic surgery  Infectious disease  Dental   OB GYN   Antimicrobials:  Anti-infectives (From admission, onward)   Start     Dose/Rate Route Frequency Ordered Stop   06/26/18 0100  vancomycin (VANCOCIN) IVPB 1000 mg/200 mL premix     1,000 mg 200 mL/hr over 60 Minutes Intravenous Every 8 hours 06/25/18 1758 08/04/18 0159   06/23/18 0000  vancomycin (VANCOCIN) 1,250 mg in sodium chloride 0.9 % 250 mL IVPB  Status:  Discontinued     1,250 mg 166.7 mL/hr over 90 Minutes Intravenous Every 8 hours 06/22/18 2332 06/25/18 1758   06/20/18 1000  vancomycin (VANCOCIN) 2,000 mg in sodium chloride 0.9 % 500 mL IVPB  Status:  Discontinued     2,000 mg 250 mL/hr over 120 Minutes Intravenous Every 12 hours 06/19/18 2221 06/22/18 2332   06/18/18 2000  vancomycin (VANCOCIN) IVPB 1000 mg/200 mL premix  Status:  Discontinued     1,000 mg 200 mL/hr over 60 Minutes Intravenous Every 8 hours 06/18/18 1054 06/19/18 2221   06/18/18 1200  vancomycin (VANCOCIN) 1,750 mg in sodium chloride 0.9 % 500 mL IVPB  1,750 mg 250 mL/hr over 120 Minutes Intravenous  Once 06/18/18 1054 06/18/18 2241   06/17/18 2200  cefTRIAXone (ROCEPHIN) 2 g in sodium chloride  0.9 % 100 mL IVPB     2 g 200 mL/hr over 30 Minutes Intravenous Every 12 hours 06/17/18 1046 08/04/18 0959   06/17/18 1200  ampicillin (OMNIPEN) 2 g in sodium chloride 0.9 % 100 mL IVPB     2 g 300 mL/hr over 20 Minutes Intravenous Every 4 hours 06/17/18 1045 08/03/18 2359   06/16/18 1300  oseltamivir (TAMIFLU) capsule 75 mg     75 mg Oral 2 times daily 06/16/18 1231 06/20/18 0919   06/15/18 0700  vancomycin (VANCOCIN) IVPB 1000 mg/200 mL premix  Status:  Discontinued     1,000 mg 200 mL/hr over 60 Minutes Intravenous Every 8 hours 06/14/18 2216 06/15/18 0711   06/15/18 0500  cefTRIAXone (ROCEPHIN) 2 g in sodium chloride 0.9 % 100 mL IVPB  Status:  Discontinued     2 g 200 mL/hr over 30 Minutes Intravenous Every 24 hours 06/15/18 0445 06/17/18 1046   06/14/18 2200  azithromycin (ZITHROMAX) 500 mg in sodium chloride 0.9 % 250 mL IVPB  Status:  Discontinued     500 mg 250 mL/hr over 60 Minutes Intravenous Every 24 hours 06/14/18 2134 06/15/18 0711   06/14/18 2200  cefTRIAXone (ROCEPHIN) 1 g in sodium chloride 0.9 % 100 mL IVPB  Status:  Discontinued     1 g 200 mL/hr over 30 Minutes Intravenous Every 24 hours 06/14/18 2134 06/15/18 0445   06/14/18 2200  vancomycin (VANCOCIN) 1,500 mg in sodium chloride 0.9 % 500 mL IVPB     1,500 mg 250 mL/hr over 120 Minutes Intravenous  Once 06/14/18 2134 06/15/18 0235       Objective: Vitals:   06/29/18 1800 06/29/18 2120 06/30/18 0300 06/30/18 0857  BP: (!) 138/94 (!) 140/95  119/79  Pulse: 78 88  88  Resp: 20 14  19   Temp: 98.6 F (37 C) 98.2 F (36.8 C)  97.6 F (36.4 C)  TempSrc: Oral Oral  Oral  SpO2: 100% 96%  95%  Weight:   86.9 kg   Height:        Intake/Output Summary (Last 24 hours) at 06/30/2018 1153 Last data filed at 06/30/2018 1126 Gross per 24 hour  Intake 3161.1 ml  Output 5150 ml  Net -1988.9 ml   Filed Weights   06/24/18 0500 06/25/18 0500 06/30/18 0300  Weight: 90.3 kg 91.5 kg 86.9 kg    Examination: General  exam: Appears calm and comfortable  Respiratory system: Clear to auscultation. Respiratory effort normal. Cardiovascular system: S1 & S2 heard, RRR. No JVD, murmurs, rubs, gallops or clicks. No pedal edema. Gastrointestinal system: Gravid uterus  Central nervous system: Alert and oriented. No focal neurological deficits. Extremities: Symmetric 5 x 5 power. Skin: No rashes, lesions or ulcers Psychiatry: Judgement and insight appear normal. Mood & affect appropriate.    Data Reviewed: I have personally reviewed following labs and imaging studies  CBC: Recent Labs  Lab 06/24/18 0552 06/25/18 0553  WBC 14.4* 11.4*  NEUTROABS 11.2*  --   HGB 7.5* 7.2*  HCT 24.1* 23.9*  MCV 90.9 91.2  PLT 344 027   Basic Metabolic Panel: Recent Labs  Lab 06/23/18 1625 06/24/18 0552 06/25/18 0553 06/29/18 0509  NA 134* 133* 133* 133*  132*  K 4.3 4.2 3.7 4.3  4.2  CL 98 97* 99 96*  95*  CO2 26  24 27 26  27   GLUCOSE 87 82 94 74  77  BUN <5* <5* <5* <5*  <5*  CREATININE 0.46 0.43* 0.48 0.45  0.42*  CALCIUM 8.1* 8.3* 8.0* 8.4*  8.4*  MG  --   --  1.7  --    GFR: Estimated Creatinine Clearance: 113.2 mL/min (A) (by C-G formula based on SCr of 0.42 mg/dL (L)). Liver Function Tests: Recent Labs  Lab 06/23/18 1625 06/25/18 0553 06/29/18 0509  AST 14* 15 35  ALT 6 6 12   ALKPHOS 181* 166* 240*  BILITOT 1.1 0.7 1.2  PROT 6.5 6.0* 6.7  ALBUMIN 1.6* 1.4* 1.5*   No results for input(s): LIPASE, AMYLASE in the last 168 hours. No results for input(s): AMMONIA in the last 168 hours. Coagulation Profile: No results for input(s): INR, PROTIME in the last 168 hours. Cardiac Enzymes: No results for input(s): CKTOTAL, CKMB, CKMBINDEX, TROPONINI in the last 168 hours. BNP (last 3 results) No results for input(s): PROBNP in the last 8760 hours. HbA1C: No results for input(s): HGBA1C in the last 72 hours. CBG: Recent Labs  Lab 06/27/18 0746 06/28/18 0751 06/29/18 0733 06/29/18 0855  06/30/18 0758  GLUCAP 74 71 64* 85 71   Lipid Profile: No results for input(s): CHOL, HDL, LDLCALC, TRIG, CHOLHDL, LDLDIRECT in the last 72 hours. Thyroid Function Tests: No results for input(s): TSH, T4TOTAL, FREET4, T3FREE, THYROIDAB in the last 72 hours. Anemia Panel: No results for input(s): VITAMINB12, FOLATE, FERRITIN, TIBC, IRON, RETICCTPCT in the last 72 hours. Sepsis Labs: No results for input(s): PROCALCITON, LATICACIDVEN in the last 168 hours.  Recent Results (from the past 240 hour(s))  Culture, Urine     Status: None   Collection Time: 06/26/18 11:30 AM  Result Value Ref Range Status   Specimen Description URINE, CLEAN CATCH  Final   Special Requests NONE  Final   Culture   Final    NO GROWTH Performed at [facility], [address] [place], [place] [address]    Report Status 06/27/2018 FINAL  Final       Radiology Studies: No results found.    Scheduled Meds: . enoxaparin (LOVENOX) injection  40 mg Subcutaneous Daily  . feeding supplement (ENSURE ENLIVE)  237 mL Oral BID BM  . methadone  40 mg Oral q1800  . methadone  60 mg Oral Daily  . prenatal multivitamin  1 tablet Oral Q1200  . sodium chloride flush  10-40 mL Intracatheter Q12H  . sodium chloride flush  3 mL Intravenous Q12H   Continuous Infusions: . ampicillin (OMNIPEN) IV 2 g (06/30/18 0812)  . cefTRIAXone (ROCEPHIN)  IV 2 g (06/30/18 1048)  . vancomycin 1,000 mg (06/30/18 0927)     LOS: 15 days    Time spent: 20 minutes   Dessa Phi, DO [facility] [email or web address] 06/30/2018, 11:53 AM

## 2018-06-30 NOTE — Progress Notes (Signed)
FHR 166BPM by doppler. No vaginal bleeding or leaking of fluid. Dr. Roselie Awkward notified.

## 2018-06-30 NOTE — Progress Notes (Signed)
FACULTY PRACTICE ANTEPARTUM PROGRESS NOTE  Kelly [name] is a 33 y.o. [phone] at 23w2dwho is admitted for bacteremia secondary to tricuspid valve endocarditis.  Estimated Date of Delivery: 11/01/18  Length of Stay:  15 Days. Admitted 06/14/2018  Subjective: Patient reports normal fetal movement.  She denies uterine contractions, denies bleeding and leaking of fluid per vagina.  Vitals:  Blood pressure 119/79, pulse 88, temperature 97.6 F (36.4 C), temperature source Oral, resp. rate 19, height 5' 7"  (1.702 m), weight 86.9 kg, last menstrual period 12/30/2017, SpO2 95 %, unknown if currently breastfeeding. Physical Examination: CONSTITUTIONAL: Well-developed, well-nourished female in no acute distress.  HENT:  Normocephalic, atraumatic, External right and left ear normal. Oropharynx is clear and moist EYES: Conjunctivae and EOM are normal. Pupils are equal, round, and reactive to light. No scleral icterus.  NECK: Normal range of motion, supple, no masses. SKIN: Skin is warm and dry. No rash noted. Not diaphoretic. No erythema. No pallor. [place]cop[place] Alert and oriented to person, place, and time. Normal reflexes, muscle tone coordination. No cranial nerve deficit noted. PSYCHIATRIC: Normal mood and affect. Normal behavior. Normal judgment and thought content. CARDIOVASCULAR: Normal heart rate noted RESPIRATORY: Effort normal, no problems with respiration noted MUSCULOSKELETAL: Normal range of motion. No edema and no tenderness. ABDOMEN: Soft, nontender, nondistended, gravid. CERVIX: deferred  Fetal monitoring: FHR: 168 bpm  ASSESSMENT: Principal Problem:   Bacteremia due to Enterococcus Active Problems:   Opioid use disorder, severe, dependence (HCC)   Sepsis due to pneumonia ([place]   IV drug abuse complicating pregnancy ([place]   Multifocal pneumonia   Hyponatremia   Elevated transaminase level   Degenerative arthritis of lumbar spine   History of bacterial endocarditis   Severe  tricuspid regurgitation by prior echocardiogram   Influenza B   Rubella non-immune status, antepartum   High-risk pregnancy in second trimester   Preexisting hypertension complicating pregnancy, antepartum negative 24 hr urine protein  PLAN:  - Cont daily FH doppler - Will plan for routine monitoring once patient reaches viability at 24 weeks - Negative preeclampsia labs, likely underlying chronic HTN vs cocaine sequela - Would not recommend treating BP in pregnancy unless she is consistently >150/100 - Cleared for dental procedures (please see note from 2/14 for further detail)   Appreciate hospitalist team care  For OB/GYN issues, please call the [facility] [place] [place] Friday, 8 am - 5 pm: [phone] other times: [phone]  K. MArvilla Meres M.D. [facility]ending [facility])  06/30/2018 4:31 PM

## 2018-06-30 NOTE — Consult Note (Signed)
DENTAL CONSULTATION  Date of Consultation:  06/30/2018 Patient Name:   Kelly [name] Date of Birth:   Jul 21, 1985 Medical Record Number: [id number]  VITALS: BP (!) 140/95 (BP Location: Left Arm)   Pulse 88   Temp 98.2 F (36.8 C) (Oral)   Resp 14   Ht 5' 7"  (1.702 m)   Wt 86.9 kg   LMP 12/30/2017 (Within Weeks)   SpO2 96%   BMI 30.01 kg/m   CHIEF COMPLAINT: Patient was referred by Dr. Maylene Roes for a dental consultation.  HPI: Kelly [name] is a 33 year old female recently admitted with bacteremia with enterococcus.  Patient is currently on IV antibiotic therapy.  Patient is also approximately [redacted] weeks pregnant.  A dental consultation was requested to evaluate possible dental source for the bacteremia.    Patient is currently complaining of lower right molar tooth pain.  Patient points to tooth #31as the offending tooth.  Patient indicates tooth #30 also bothers her as well.  She describes a dull and throbbing pain that is constant in nature.  This has been occurring for the past 2 weeks by patient report.  The pain reaches intensity of 9 out of 10 but is currently 8 out of 10 by patient report.  Patient has been taking Tylenol with minimal relief.  Patient occasionally also has applied Anbesol to the area with "some relief".  The patient last saw dentist a couple of years ago.  Patient had an exam and cleaning at that time.  The patient sees Dr. Lavella Hammock for her primary dental care needs.  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  PROBLEM LIST: Patient Active Problem List   Diagnosis Date Noted  . Preexisting hypertension complicating pregnancy, antepartum 06/29/2018  . High-risk pregnancy in second trimester 06/27/2018  . Rubella non-immune status, antepartum 06/23/2018  . Influenza B 06/17/2018  . Bacteremia due to Enterococcus 06/15/2018  . Multifocal pneumonia 06/14/2018  . Hyponatremia 06/14/2018  . Elevated transaminase level 06/14/2018  . Degenerative arthritis of  lumbar spine 06/14/2018  . History of bacterial endocarditis 06/14/2018  . Severe tricuspid regurgitation by prior echocardiogram 06/14/2018  . 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 Difficult intravenous access   . IV drug abuse complicating pregnancy ([place])   . AKI (acute kidney injury) ([place])   . Sepsis due to pneumonia ([place]) 12/02/2017  . Bipolar disorder ([place]) 07/31/2016  . Major depressive disorder, recurrent episode with anxious distress ([place]) 07/29/2016  . Normal labor 07/26/2016  . Foreign body granuloma of soft tissue, NEC, right upper arm   . Pyelonephritis 05/28/2016  . Polysubstance abuse ([place]) 05/25/2016  . No prenatal care in current pregnancy 05/25/2016  . Chronic hypertension affecting pregnancy 05/25/2016  . History of cesarean delivery affecting pregnancy 05/25/2016  . Community acquired pneumonia 05/25/2016  . Pyelonephritis affecting pregnancy 05/24/2016  . Palliative care encounter   . Abscess of right breast 07/13/2014  . Opioid use disorder, severe, dependence ([place]) 08/25/2013  . Unspecified episodic mood disorder 08/25/2013  . ADHD (attention deficit hyperactivity disorder) 08/25/2013  . Cocaine abuse ([place]) 08/25/2013  . Thoracic back pain 01/29/2013  . CROHN'S DISEASE 05/03/2005  . COLONIC POLYPS 02/28/2003    PMH: Past Medical History:  Diagnosis Date  . 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 Abscess of breast    rt breast  . ADD (attention deficit disorder)   . Back pain   . Bipolar 1 disorder ([place])   .  Hypertension   . Leukocytosis   . Polysubstance abuse ([place])   . Sepsis ([place]) 12/02/2017  . Suicidal overdose ([facility])     PSH: Past Surgical History:  Procedure Laterality Date  . BACK SURGERY     scoliosis-in care everywhere 08/1999  . CESAREAN SECTION    . CHOLECYSTECTOMY  March 2015  . FOREIGN BODY REMOVAL Right 05/28/2016   Procedure: REMOVAL FOREIGN BODY RIGHT ARM;  Surgeon: Meredith Pel, MD;  Location: [facility];  Service: Orthopedics;  Laterality: Right;  . IRRIGATION AND DEBRIDEMENT ABSCESS Right 07/13/2014   Procedure: IRRIGATION AND DEBRIDEMEN TRIGHT BREAST ABSCESS;  Surgeon: Donnie Mesa, MD;  Location: [facility];  Service: General;  Laterality: Right;  . LUMBAR LAMINECTOMY/DECOMPRESSION MICRODISCECTOMY N/A 12/05/2017   Procedure: LUMBAR LAMINECTOMY FOR EPIDURAL ABSCESS;  Surgeon: Newman Pies, MD;  Location: [facility];  Service: Neurosurgery;  Laterality: N/A;  . RADIOLOGY WITH ANESTHESIA N/A 12/05/2017   Procedure: MRI WITH ANESTHESIA OF THORCIC AND LUMBAR SPINE WITH AND WITHOUT;  Surgeon: Radiologist, Medication, MD;  Location: [facility];  Service: Radiology;  Laterality: N/A;    ALLERGIES: Allergies  Allergen Reactions  . Hydrocodone Itching  . Morphine And Related Nausea And Vomiting    MEDICATIONS: Current Facility-Administered Medications  Medication Dose Route Frequency Provider Last Rate Last Dose  . acetaminophen (TYLENOL) tablet 650 mg  650 mg Oral Q6H PRN Opyd, Ilene Qua, MD   650 mg at 06/26/18 2053   Or  . acetaminophen (TYLENOL) suppository 650 mg  650 mg Rectal Q6H PRN Opyd, Ilene Qua, MD      . albuterol (PROVENTIL) (2.5 MG/3ML) 0.083% nebulizer solution 2.5 mg  2.5 mg Nebulization Q2H PRN Swayze, Ava, DO   2.5 mg at 06/24/18 2051  . ampicillin (OMNIPEN) 2 g in sodium chloride 0.9 % 100 mL IVPB  2 g Intravenous Q4H Purohit, Konrad Dolores, MD   Stopped at 06/30/18 0330  . benzocaine (ORAJEL) 10 % mucosal gel   Mouth/Throat QID PRN Omar Person, NP      . benzonatate (TESSALON) capsule 200 mg  200 mg Oral TID PRN Lovey Newcomer T, NP   200 mg at 06/25/18 2046  . cefTRIAXone (ROCEPHIN) 2 g in sodium chloride 0.9 % 100 mL IVPB  2 g Intravenous Q12H Purohit, Konrad Dolores, MD   Stopped at 06/29/18 2137  . diphenhydrAMINE (BENADRYL) injection 25 mg  25 mg Intravenous Q6H PRN Lavina Hamman, MD   25 mg at 06/30/18 0438  . enoxaparin (LOVENOX) injection 40 mg  40  mg Subcutaneous Daily Opyd, Ilene Qua, MD   40 mg at 06/29/18 0947  . feeding supplement (ENSURE ENLIVE) (ENSURE ENLIVE) liquid 237 mL  237 mL Oral BID BM Lavina Hamman, MD   237 mL at 06/27/18 0910  . methadone (DOLOPHINE) tablet 40 mg  40 mg Oral q1800 Purohit, Shrey C, MD   40 mg at 06/29/18 1754  . methadone (DOLOPHINE) tablet 60 mg  60 mg Oral Daily Purohit, Shrey C, MD   60 mg at 06/29/18 0946  . ondansetron (ZOFRAN) injection 4 mg  4 mg Intravenous Q6H PRN Lavina Hamman, MD   4 mg at 06/30/18 0438  . phenol (CHLORASEPTIC) mouth spray 1 spray  1 spray Mouth/Throat PRN Dessa Phi, DO   1 spray at 06/25/18 2046  . prenatal multivitamin tablet 1 tablet  1 tablet Oral Q1200 Opyd, Ilene Qua, MD   1 tablet at 06/29/18 1809  . promethazine (PHENERGAN) injection  12.5-25 mg  12.5-25 mg Intravenous Q6H PRN Lovey Newcomer T, NP   25 mg at 06/30/18 0148  . sodium chloride (OCEAN) 0.65 % nasal spray 2 spray  2 spray Each Nare PRN Swayze, Ava, DO      . sodium chloride flush (NS) 0.9 % injection 10-40 mL  10-40 mL Intracatheter Q12H Lavina Hamman, MD   10 mL at 06/29/18 2254  . sodium chloride flush (NS) 0.9 % injection 10-40 mL  10-40 mL Intracatheter PRN Lavina Hamman, MD      . sodium chloride flush (NS) 0.9 % injection 3 mL  3 mL Intravenous Q12H Opyd, Ilene Qua, MD   3 mL at 06/28/18 0022  . vancomycin (VANCOCIN) IVPB 1000 mg/200 mL premix  1,000 mg Intravenous Q8H Tyrone Apple, [facility]   Stopped at 06/30/18 0243    LABS: Lab Results  Component Value Date   WBC 11.4 (H) 06/25/2018   HGB 7.2 (L) 06/25/2018   HCT 23.9 (L) 06/25/2018   MCV 91.2 06/25/2018   PLT 356 06/25/2018      Component Value Date/Time   NA 132 (L) 06/29/2018 0509   NA 133 (L) 06/29/2018 0509   NA 136 07/02/2013 0547   K 4.2 06/29/2018 0509   K 4.3 06/29/2018 0509   K 3.7 07/02/2013 0547   CL 95 (L) 06/29/2018 0509   CL 96 (L) 06/29/2018 0509   CL 102 07/02/2013 0547   CO2 27 06/29/2018 0509   CO2 26  06/29/2018 0509   CO2 30 07/02/2013 0547   GLUCOSE 77 06/29/2018 0509   GLUCOSE 74 06/29/2018 0509   GLUCOSE 113 (H) 07/02/2013 0547   BUN <5 (L) 06/29/2018 0509   BUN <5 (L) 06/29/2018 0509   BUN 7 07/02/2013 0547   CREATININE 0.42 (L) 06/29/2018 0509   CREATININE 0.45 06/29/2018 0509   CREATININE 0.82 07/02/2013 0547   CALCIUM 8.4 (L) 06/29/2018 0509   CALCIUM 8.4 (L) 06/29/2018 0509   CALCIUM 8.8 07/02/2013 0547   GFRNONAA >60 06/29/2018 0509   GFRNONAA >60 06/29/2018 0509   GFRNONAA >60 07/02/2013 0547   GFRAA >60 06/29/2018 0509   GFRAA >60 06/29/2018 0509   GFRAA >60 07/02/2013 0547   Lab Results  Component Value Date   INR 0.99 06/14/2018   INR 1.0 06/28/2013   No results found for: PTT  SOCIAL HISTORY: Social History   Socioeconomic History  . Marital status: Single    Spouse name: Not on file  . Number of children: Not on file  . Years of education: Not on file  . Highest education level: Not on file  Occupational History  . Not on file  Social Needs  . Financial resource strain: Not on file  . Food insecurity:    Worry: Not on file    Inability: Not on file  . Transportation needs:    Medical: Not on file    Non-medical: Not on file  Tobacco Use  . Smoking status: Current Some Day Smoker    Packs/day: 1.00    Years: 13.00    Pack years: 13.00    Types: Cigarettes  . Smokeless tobacco: Never Used  Substance and Sexual Activity  . Alcohol use: Yes    Comment: one or two drinks weekly  . Drug use: Yes    Types: Cocaine, Heroin, IV    Comment: heroin, crack cocaine last used 06/13/2018  . Sexual activity: Yes    Birth control/protection: Condom  Lifestyle  .  Physical activity:    Days per week: Not on file    Minutes per session: Not on file  . Stress: Not on file  Relationships  . Social connections:    Talks on phone: Not on file    Gets together: Not on file    Attends religious service: Not on file    Active member of club or  organization: Not on file    Attends meetings of clubs or organizations: Not on file    Relationship status: Not on file  . Intimate partner violence:    Fear of current or ex partner: Not on file    Emotionally abused: Not on file    Physically abused: Not on file    Forced sexual activity: Not on file  Other Topics Concern  . Not on file  Social History Narrative  . Not on file    FAMILY HISTORY: Family History  Problem Relation Age of Onset  . Diabetes Mother   . Diabetes Father   . Hypertension Father   . Heart attack Neg Hx   . Hyperlipidemia Neg Hx   . Sudden death Neg Hx     REVIEW OF SYSTEMS: Reviewed with the patient as per History of present illness. 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    DENTAL HISTORY: CHIEF COMPLAINT: Patient was referred by Dr. Maylene Roes for a dental consultation.  HPI: Kelly [name] is a 33 year old female recently admitted with bacteremia with enterococcus.  Patient is currently on IV antibiotic therapy.  Patient is also approximately [redacted] weeks pregnant.  A dental consultation was requested to evaluate possible dental source for the bacteremia.    Patient is currently complaining of lower right molar tooth pain.  Patient points to tooth #31as the offending tooth.  Patient indicates tooth #30 also bothers her as well.  She describes a dull and throbbing pain that is constant in nature.  This has been occurring for the past 2 weeks by patient report.  The pain reaches intensity of 9 out of 10 but is currently 8 out of 10 by patient report.  Patient has been taking Tylenol with minimal relief.  Patient occasionally also has applied Anbesol to the area with "some relief".  The patient last saw dentist a couple of years ago.  Patient had an exam and cleaning at that time.  The patient sees Dr. Lavella Hammock for her primary dental care needs.  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   DENTAL EXAMINATION: GENERAL: The patient is a  well-developed, well-nourished female no acute distress. HEAD AND NECK: There is no palpable neck lymphadenopathy.  The patient denies acute TMJ symptoms. INTRAORAL EXAM: The patient has normal saliva.  There is a buccal swelling in the area of #31.  Patient has bilateral mandibular lingual tori. DENTITION: The patient is missing multiple teeth.  The patient has had previous extraction of third molar teeth as well as additional teeth for orthodontic purposes.  PERIODONTAL: Patient has chronic periodontitis with plaque and calculus accumulations, selective areas gingival recession and incipient mandibular anterior tooth mobility. DENTAL CARIES/SUBOPTIMAL RESTORATIONS: Patient has multiple dental caries.  The caries on tooth numbers 30 and 31 appear to be impinging on the pulp.  Patient would require a full series dental radiographs to identify the extent of the dental caries. ENDODONTIC: The patient is complaining of acute pulpitis symptoms involving tooth numbers 30 and 31.  Patient has had a previous root canal therapy associated with tooth #3. CROWN AND  BRIDGE:   PROSTHODONTIC: Patient has a crown on tooth #3. OCCLUSION: Patient has a history of orthodontic therapy.  The patient has a stable occlusion at this time.  RADIOGRAPHIC INTERPRETATION: An orthopantogram was taken on 06/25/2018 by the department of radiology. There are multiple missing teeth.  There are rampant dental caries.  There is a periapical radiolucency at the apex of tooth #31.  The caries on tooth numbers 30 and 31 appear to be impinging on the pulp consistent with toothache symptoms.  There is an incipient bone loss noted.  There is a previous root canal therapy associated with tooth #3.   ASSESSMENTS: 1.  Bacteremia due to enterococcus 2.  Severe tricuspid regurgitation 3.  History of bacterial endocarditis 4.  Acute pulpitis 5.  Chronic apical periodontitis 6.  Dental caries 7.  Chronic periodontitis of bone loss 8.   Gingival recession 9.  Accretions 10.  Mandibular anterior tooth mobility 11.  Multiple missing teeth 12.  History of orthodontic therapy 13.  Stable occlusion 14.  Current pregnancy with potential risk for complications to patient and fetus with anticipated dental procedures in the operating room with general anesthesia 15. Need for antibiotic premedication prior to invasive dental procedures due to history of previous bacterial endocarditis per American Heart Association guidelines. 16.  Dental phobia  17.  Risk for bleeding with invasive dental procedures due to current Lovenox therapy.  PLAN/RECOMMENDATIONS: 1. I discussed the risks, benefits, and complications of various treatment options with the patient and her fetus in relationship to her medical and dental conditions, current pregnancy, and risk for future endocarditis. We discussed various treatment options to include no treatment, multiple extractions with alveoloplasty, pre-prosthetic surgery as indicated, periodontal therapy, dental restorations, root canal therapy, crown and bridge therapy, implant therapy, and replacement of missing teeth as indicated. The patient currently wishes to proceed with extraction of tooth numbers 30 and 31 with alveoloplasty in the operating room with general anesthesia.  This has been scheduled for Thursday, 07/03/2018 at 7:30 AM at [facility].  The medical team has contacted the OB/GYN team and indicates that the patient is cleared for dental extraction procedures in the operating room with general anesthesia. The patient is aware that she will need to follow-up with her primary dentist for continued dental care once she is medically stable from her pregnancy and other medical conditions.  The patient is aware that she will require antibiotic premedication prior to invasive dental procedures per American Heart Association guidelines.  2. Discussion of findings with medical team and coordination of  future medical and dental care as needed.     Lenn Cal, DDS

## 2018-06-30 NOTE — Progress Notes (Addendum)
Anesthesia Chart Review: Patient is currently admitted, so further evaluation by anesthesiologist.   Case:  [id number] Date/Time:  07/03/18 0715   Procedure:  MULTIPLE EXTRACTION WITH ALVEOLOPLASTY (Bilateral )   Anesthesia type:  General   Pre-op diagnosis:  ENDOCARDITIS AND DENTAL PAIN   Location:  [name] ROOM 58 / Kelly [name]   Surgeon:  Lenn Cal, DDS      DISCUSSION: Patient is a 33 year old pregnant female scheduled for the above procedure.  (21w 3d viable interuterine pregnancy as of 06/22/18 [place])  Kelly [name] was admitted on 06/14/18 with low back pain, cough, and malaise with history of current pregnancy, IV drug abuse and hospitalization 11/2017 for MSSA bacteremia/TV endocarditis/epidural abscess. Diagnosed with sepsis secondary to multifocal pneumonia with bacteremia (Streptococcus [sanguinous and mitis] and Enterococcus faecalis on 06/14/18 blood cultures; IV antibiotic through 08/03/18), UTI (E. Coli urine culture 06/14/18), influenza B positive 06/16/18 (on droplet precautions). Stable appearance of the lumbar spine per MRI. ID, OB/GYN, and CT surgery consulted. Dental consult also obtained to evaluate for possible source of bacteremia with imaging revealing right mandibular last molar tooth compatible with abscess.   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    - On 06/27/18, OB Verita Schneiders, MD wrote,  "From an obstetric standpoint, patient is has a previable fetus and needs no fetal monitoring during any dental procedure.  Kelly [name] may have all the usual dental procedures as if Kelly [name]  were not pregnant including but not limited to the use of shielded dental X-rays and local infiltration anesthesia with Novocaine with [name] without Epinephrine.  General [name] regional anesthesia can also be administered if needed.  Kelly [name] may have the usual narcotic analgesia and antibiotics as needed [name] indicated before, during and after the procedure." - As of 06/29/18, a 24 hour urine protein and creatinine have been ordered to evaluate for the possibility of superimposed preeclampsia and continued plans to continue to monitor FHR about twice a week given gestation/fetal previability. (FHR dopplers at 143 bpm on 06/26/18.)  - Per CT surgery consult 06/17/18 with Lanelle Bal, MD: "Currently patient is poor candidate to consider tricuspid repair [name] replacement  Aggressive medical therapy".   - Reviewed known information with anesthesiologist Suella Broad, MD. Patient is currently admitted to [facility]. Will reach out to Dr. Harolyn Rutherford regarding timing of any postoperative fetal dopplers.    ADDENDUM 07/01/18 2:34 PM: Per Dr. Harolyn Rutherford, "[facility] can be called to perform a HFR Doppler check postoperatively." Kelly [name] routed message to Limmie Patricia, RN as well. Per 06/30/18 note by Shelby Mattocks, MD, patient is getting FH dopplers daily with plan for routine monitoring once patient reaches viability at 24 weeks. Preeclampsia labs were negative. EDD documented as 11/01/18.   VS: BP 119/79   Pulse 88   Temp 36.4 C (Oral)   Resp 19   Ht 5' 7"  (1.702 m)   Wt 86.9 kg   LMP 12/30/2017 (Within Weeks)   SpO2 95%   BMI 30.01 kg/m    PROVIDERS: [facility]  - Cardiologist is with [facility]. Initial consulting cardiologist was Quay Burow, MD on  12/06/17. Last notation seen by Minus Breeding, MD on 12/11/17 indicating plan to complete antibiotic per ID for endocarditis and follow-up with repeat TTE and blood cultures at that time.    LABS: Most recent labs as of  06/30/18 11:30 AM include: Lab Results  Component Value Date   WBC 11.4 (H) 06/25/2018   HGB 7.2 (L) 06/25/2018   HCT 23.9 (L) 06/25/2018   PLT 356 06/25/2018   GLUCOSE 77 06/29/2018   GLUCOSE 74 06/29/2018   ALT 12 06/29/2018   AST 35 06/29/2018   NA 132 (L) 06/29/2018   NA 133 (L) 06/29/2018   K 4.2 06/29/2018   K 4.3 06/29/2018   CL 95 (L) 06/29/2018   CL 96 (L) 06/29/2018   CREATININE 0.42 (L) 06/29/2018   CREATININE 0.45 06/29/2018   BUN <5 (L) 06/29/2018   BUN <5 (L) 06/29/2018   CO2 27 06/29/2018   CO2 26 06/29/2018   TSH 4.219 12/22/2017   INR 0.99 06/14/2018  Non-reactive RPR, HIV screen, negative Hepatitis B surface Ag 06/20/18.  E. coli urinary culture 06/14/18, no growth on repeat urine culture 06/26/18. Streptococcus sanguinous and Enterococcus faecalis on 06/14/18 blood cultures, no growth on repeat blood culture 06/16/18.   IMAGES: DG orthopantogram 06/25/18: IMPRESSION: Periapical lucency the right mandibular last molar tooth compatible with abscess. Dental caries.  Limited OB U/S 06/22/18: IMPRESSION: Viable 21 week 3 day intrauterine pregnancy. Fetal heart rate 137 beats per minute. No acute maternal findings. (This exam is performed on an emergent basis and does not comprehensively evaluate fetal size, dating, [name] anatomy; follow-up complete OB US should be considered if further fetal assessment is warranted.)  CXR 06/14/18: IMPRESSION: 1. New mid and lower lung zone opacities, linear and reticular as well as hazy airspace opacities, with airspace opacities most evident in the left mid lung. These findings are new prior exam. Suspect multifocal pneumonia. 2. No evidence of pulmonary edema.  No other change.  MRI L-spine 06/14/18: IMPRESSION: 1. No strong evidence of acute infection in the lumbar spine [name] visible sacrum. Stable MRI appearance of the lumbar spine since September 2019. 2. Distended urinary bladder. 3. Extensive chronic spinal hardware  artifact from the lower thoracic levels through L3, and postoperative changes at L4-L5 and L5-S1. 4. 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   EKG: 06/17/18: NSR   CV: Echo (transthoraic) 06/16/18: IMPRESSIONS 1. Tricuspid regurgitation severe by color flow Doppler. There is a large mobile tricuspid valve vegetation on the anterior leaflet. The TV vegetation measures 10 mm x 20 mm. 2. The left ventricle has normal systolic function of 63-14%. The cavity size is normal. There is no left ventricular wall thickness. Echo evidence of normal diastolic filling patterns. 3. The right ventricle is moderately enlarged in size. There is normal systolic function. Right ventricular systolic pressure is normal with an estimated pressure of 43.8 mmHg. 4. Mildly dilated left atrial size. 5. Moderately dilated right atrial size. 6. The mitral valve is myxomatous. There is mild thickening. Regurgitation is not visualized by color flow Doppler. 7. Large vegetation on the tricuspid valve. 8. Myxomatous tricuspid valve. 9. Tricuspid regurgitation severe. 10. No atrial level shunt detected by color flow Doppler.   Past Medical History:  Diagnosis Date  . 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 Abscess of breast    rt breast  . ADD (attention deficit disorder)   . Back pain   . Bipolar 1 disorder ([place])   .  Hypertension   . Leukocytosis   . Polysubstance abuse ([place])   . Sepsis ([place]) 12/02/2017  . Suicidal overdose [facility])     Past Surgical History:  Procedure Laterality Date  . BACK SURGERY     scoliosis-in care everywhere 08/1999  . CESAREAN SECTION    . CHOLECYSTECTOMY  March 2015  . FOREIGN BODY REMOVAL Right 05/28/2016   Procedure: REMOVAL FOREIGN BODY RIGHT ARM;  Surgeon: Meredith Pel, MD;  Location: [facility];  Service: Orthopedics;  Laterality: Right;  . IRRIGATION AND DEBRIDEMENT ABSCESS Right 07/13/2014   Procedure: IRRIGATION  AND DEBRIDEMEN TRIGHT BREAST ABSCESS;  Surgeon: Donnie Mesa, MD;  Location: [facility];  Service: General;  Laterality: Right;  . LUMBAR LAMINECTOMY/DECOMPRESSION MICRODISCECTOMY N/A 12/05/2017   Procedure: LUMBAR LAMINECTOMY FOR EPIDURAL ABSCESS;  Surgeon: Newman Pies, MD;  Location: [facility];  Service: Neurosurgery;  Laterality: N/A;  . RADIOLOGY WITH ANESTHESIA N/A 12/05/2017   Procedure: MRI WITH ANESTHESIA OF THORCIC AND LUMBAR SPINE WITH AND WITHOUT;  Surgeon: Radiologist, Medication, MD;  Location: [facility];  Service: Radiology;  Laterality: N/A;    MEDICATIONS: . acetaminophen (TYLENOL) tablet 650 mg   [name]  . acetaminophen (TYLENOL) suppository 650 mg  . albuterol (PROVENTIL) (2.5 MG/3ML) 0.083% nebulizer solution 2.5 mg  . ampicillin (OMNIPEN) 2 g in sodium chloride 0.9 % 100 mL IVPB  . benzocaine (ORAJEL) 10 % mucosal gel  . benzonatate (TESSALON) capsule 200 mg  . cefTRIAXone (ROCEPHIN) 2 g in sodium chloride 0.9 % 100 mL IVPB  . diphenhydrAMINE (BENADRYL) injection 25 mg  . enoxaparin (LOVENOX) injection 40 mg  . feeding supplement (ENSURE ENLIVE) (ENSURE ENLIVE) liquid 237 mL  . methadone (DOLOPHINE) tablet 40 mg  . methadone (DOLOPHINE) tablet 60 mg  . ondansetron (ZOFRAN) injection 4 mg  . phenol (CHLORASEPTIC) mouth spray 1 spray  . prenatal multivitamin tablet 1 tablet  . promethazine (PHENERGAN) injection 12.5-25 mg  . sodium chloride (OCEAN) 0.65 % nasal spray 2 spray  . sodium chloride flush (NS) 0.9 % injection 10-40 mL  . sodium chloride flush (NS) 0.9 % injection 10-40 mL  . sodium chloride flush (NS) 0.9 % injection 3 mL  . vancomycin (VANCOCIN) IVPB 1000 mg/200 mL premix    Myra Gianotti, PA-C Surgical Short Stay/Anesthesiology [facility] Phone [phone] [facility] Phone [phone] 06/30/2018 2:25 PM

## 2018-07-01 LAB — SMN1 COPY NUMBER ANALYSIS (SMA CARRIER SCREENING)

## 2018-07-01 LAB — GLUCOSE, CAPILLARY: Glucose-Capillary: 63 mg/dL — ABNORMAL LOW (ref 70–99)

## 2018-07-01 NOTE — Anesthesia Preprocedure Evaluation (Deleted)
Anesthesia Evaluation    Airway        Dental   Pulmonary Current Smoker,           Cardiovascular hypertension,      Neuro/Psych    GI/Hepatic   Endo/Other    Renal/GU      Musculoskeletal   Abdominal   Peds  Hematology   Anesthesia Other Findings   Reproductive/Obstetrics                                                              Anesthesia Evaluation  Patient identified by MRN, date of birth, ID band Patient awake    Reviewed: Allergy & Precautions, NPO status , Patient's Chart, lab work & pertinent test results  History of Anesthesia Complications Negative for: history of anesthetic complications  Airway Mallampati: II  TM Distance: >3 FB Neck ROM: Full    Dental  (+) Teeth Intact   Pulmonary pneumonia, Current Smoker,    + rhonchi        Cardiovascular hypertension, negative cardio ROS   Rhythm:Regular     Neuro/Psych PSYCHIATRIC DISORDERS Bipolar Disorder negative neurological ROS     GI/Hepatic negative GI ROS, (+)     substance abuse  IV drug use,   Endo/Other    Renal/GU      Musculoskeletal   Abdominal   Peds  Hematology   Anesthesia Other Findings   Reproductive/Obstetrics (+) Pregnancy                             Anesthesia Physical  Anesthesia Plan  ASA: III  Anesthesia Plan: General   Post-op Pain Management:    Induction: Intravenous  PONV Risk Score and Plan: 3 and Ondansetron and Treatment may vary due to age or medical condition  Airway Management Planned: LMA and Oral ETT  Additional Equipment: None  Intra-op Plan:   Post-operative Plan: Extubation in OR  Informed Consent: I have reviewed the patients History and Physical, chart, labs and discussed the procedure including the risks, benefits and alternatives for the proposed anesthesia with the patient or authorized representative  who has indicated his/her understanding and acceptance.   Dental advisory given  Plan Discussed with: CRNA, Surgeon and Anesthesiologist  Anesthesia Plan Comments: (  )        Anesthesia Quick Evaluation  Anesthesia Physical Anesthesia Plan  ASA: III  Anesthesia Plan:    Post-op Pain Management:    Induction:   PONV Risk Score and Plan:   Airway Management Planned:   Additional Equipment:   Intra-op Plan:   Post-operative Plan:   Informed Consent:   Plan Discussed with:   Anesthesia Plan Comments: (Patient admitted. Myra Gianotti, PA-C wrote chart review note. Per Dr. Harolyn Rutherford, "[facility] can be called to perform a HFR Doppler check postoperatively.")       Anesthesia Quick Evaluation

## 2018-07-01 NOTE — Progress Notes (Signed)
PROGRESS NOTE    Kelly [name]  YYT:[id number] DOB: Jun 11, 1985 DOA: 06/14/2018 PCP: [facility]     Brief Narrative:  Kelly [name] is a 33 year old with past medical history relevant for IV heroin and amphetamine abuse complicated by tricuspid valve endocarditis secondary to MSSA complicated by epidural abscess status post drainage and laminectomy approximately 1 year ago who was admitted on 06/14/2018 and found to have bacteremia secondary to tricuspid valve endocarditis with enterococcus, Streptococcus mitis/oralis. Patient is [redacted] weeks pregnant, approx due date 11/01/2018.   New events last 24 hours / Subjective: No new complaints or issues   Assessment & Plan:   Principal Problem:   Bacteremia due to Enterococcus Active Problems:   Opioid use disorder, severe, dependence (HCC)   Sepsis due to pneumonia ([place])   IV drug abuse complicating pregnancy ([place])   Multifocal pneumonia   Hyponatremia   Elevated transaminase level   Degenerative arthritis of lumbar spine   History of bacterial endocarditis   Severe tricuspid regurgitation by prior echocardiogram   Influenza B   Rubella non-immune status, antepartum   High-risk pregnancy in second trimester   Preexisting hypertension complicating pregnancy, antepartum  Sepsis due to tricuspid valve endocarditis due to enterococcus faecalis, Streptococcus sanguinous and mitis -Likely secondary to IV drug abuse.  She has evidence of severe tricuspid regurgitation on imaging. -Blood cultures on 06/14/2018 growing out enterococcus, 2 species of Streptococcus -Blood cultures on 06/16/2018 negative  -Continue IV ceftriaxone, vancomycin, ampicillin started 06/17/2018 -Appreciate infectious disease recommendations -Disposition is difficult as patient is IV drug user and she will need long-term IV antibiotics. Will remain inpatient through 08/03/2018 for IV antibiotic tx.   Pregnancy: Patient is [redacted] weeks  pregnant. -OB following, appreciate recommendations -Work up for preeclampsia negative, would not recommend treating BP in pregnancy unless she is consistently >150/100  Dental caries -Orthopantogram completed which revealed periapical luncency right mandicular molar tooth compatible with abscess, dental caries. Consulted Dr. Enrique Sack, planning for dental extraction under general anesthesia Thurs 07/03/2018. Cleared from OB perspective.   Influenza B -Patient was presented with influenza B infection and completed a course of oseltamivir. Discontinue droplet precautions.   Polysubstance abuse -Continue methadone    DVT prophylaxis: Lovenox Code Status: Full code Family Communication: No family at bedside Disposition Plan: Complete IV antibiotics inpatient until 08/03/2018   Consultants:   Cardiothoracic surgery  Infectious disease  Dental   OB GYN   Antimicrobials:  Anti-infectives (From admission, onward)   Start     Dose/Rate Route Frequency Ordered Stop   06/26/18 0100  vancomycin (VANCOCIN) IVPB 1000 mg/200 mL premix     1,000 mg 200 mL/hr over 60 Minutes Intravenous Every 8 hours 06/25/18 1758 08/04/18 0159   06/23/18 0000  vancomycin (VANCOCIN) 1,250 mg in sodium chloride 0.9 % 250 mL IVPB  Status:  Discontinued     1,250 mg 166.7 mL/hr over 90 Minutes Intravenous Every 8 hours 06/22/18 2332 06/25/18 1758   06/20/18 1000  vancomycin (VANCOCIN) 2,000 mg in sodium chloride 0.9 % 500 mL IVPB  Status:  Discontinued     2,000 mg 250 mL/hr over 120 Minutes Intravenous Every 12 hours 06/19/18 2221 06/22/18 2332   06/18/18 2000  vancomycin (VANCOCIN) IVPB 1000 mg/200 mL premix  Status:  Discontinued     1,000 mg 200 mL/hr over 60 Minutes Intravenous Every 8 hours 06/18/18 1054 06/19/18 2221   06/18/18 1200  vancomycin (VANCOCIN) 1,750 mg in sodium chloride 0.9 % 500  mL IVPB     1,750 mg 250 mL/hr over 120 Minutes Intravenous  Once 06/18/18 1054 06/18/18 2241   06/17/18  2200  cefTRIAXone (ROCEPHIN) 2 g in sodium chloride 0.9 % 100 mL IVPB     2 g 200 mL/hr over 30 Minutes Intravenous Every 12 hours 06/17/18 1046 08/04/18 0959   06/17/18 1200  ampicillin (OMNIPEN) 2 g in sodium chloride 0.9 % 100 mL IVPB     2 g 300 mL/hr over 20 Minutes Intravenous Every 4 hours 06/17/18 1045 08/03/18 2359   06/16/18 1300  oseltamivir (TAMIFLU) capsule 75 mg     75 mg Oral 2 times daily 06/16/18 1231 06/20/18 0919   06/15/18 0700  vancomycin (VANCOCIN) IVPB 1000 mg/200 mL premix  Status:  Discontinued     1,000 mg 200 mL/hr over 60 Minutes Intravenous Every 8 hours 06/14/18 2216 06/15/18 0711   06/15/18 0500  cefTRIAXone (ROCEPHIN) 2 g in sodium chloride 0.9 % 100 mL IVPB  Status:  Discontinued     2 g 200 mL/hr over 30 Minutes Intravenous Every 24 hours 06/15/18 0445 06/17/18 1046   06/14/18 2200  azithromycin (ZITHROMAX) 500 mg in sodium chloride 0.9 % 250 mL IVPB  Status:  Discontinued     500 mg 250 mL/hr over 60 Minutes Intravenous Every 24 hours 06/14/18 2134 06/15/18 0711   06/14/18 2200  cefTRIAXone (ROCEPHIN) 1 g in sodium chloride 0.9 % 100 mL IVPB  Status:  Discontinued     1 g 200 mL/hr over 30 Minutes Intravenous Every 24 hours 06/14/18 2134 06/15/18 0445   06/14/18 2200  vancomycin (VANCOCIN) 1,500 mg in sodium chloride 0.9 % 500 mL IVPB     1,500 mg 250 mL/hr over 120 Minutes Intravenous  Once 06/14/18 2134 06/15/18 0235       Objective: Vitals:   06/30/18 1650 07/01/18 0041 07/01/18 0430 07/01/18 0807  BP: 113/85 104/79 114/80 135/89  Pulse: 86 86 87 83  Resp: 19 16 16    Temp: 97.6 F (36.4 C) 97.9 F (36.6 C) 98.4 F (36.9 C) 98 F (36.7 C)  TempSrc: Oral Oral Oral   SpO2: 96% 96% 97% 96%  Weight:      Height:        Intake/Output Summary (Last 24 hours) at 07/01/2018 1000 Last data filed at 07/01/2018 0900 Gross per 24 hour  Intake 2340.17 ml  Output 2000 ml  Net 340.17 ml   Filed Weights   06/24/18 0500 06/25/18 0500 06/30/18 0300   Weight: 90.3 kg 91.5 kg 86.9 kg   Examination: General exam: Appears calm and comfortable  Respiratory system: Clear to auscultation. Respiratory effort normal. Cardiovascular system: S1 & S2 heard, RRR. Gastrointestinal system: Abdomen with gravid uterus  Central nervous system: Alert and oriented. No focal neurological deficits. Extremities: Symmetric Skin: No rashes, lesions or ulcers on exposed skin  Psychiatry: Judgement and insight appear normal. Mood & affect appropriate.   Data Reviewed: I have personally reviewed following labs and imaging studies  CBC: Recent Labs  Lab 06/25/18 0553  WBC 11.4*  HGB 7.2*  HCT 23.9*  MCV 91.2  PLT 967   Basic Metabolic Panel: Recent Labs  Lab 06/25/18 0553 06/29/18 0509  NA 133* 133*  132*  K 3.7 4.3  4.2  CL 99 96*  95*  CO2 27 26  27   GLUCOSE 94 74  77  BUN <5* <5*  <5*  CREATININE 0.48 0.45  0.42*  CALCIUM 8.0* 8.4*  8.4*  MG 1.7  --    GFR: Estimated Creatinine Clearance: 113.2 mL/min (A) (by C-G formula based on SCr of 0.42 mg/dL (L)). Liver Function Tests: Recent Labs  Lab 06/25/18 0553 06/29/18 0509  AST 15 35  ALT 6 12  ALKPHOS 166* 240*  BILITOT 0.7 1.2  PROT 6.0* 6.7  ALBUMIN 1.4* 1.5*   No results for input(s): LIPASE, AMYLASE in the last 168 hours. No results for input(s): AMMONIA in the last 168 hours. Coagulation Profile: No results for input(s): INR, PROTIME in the last 168 hours. Cardiac Enzymes: No results for input(s): CKTOTAL, CKMB, CKMBINDEX, TROPONINI in the last 168 hours. BNP (last 3 results) No results for input(s): PROBNP in the last 8760 hours. HbA1C: No results for input(s): HGBA1C in the last 72 hours. CBG: Recent Labs  Lab 06/28/18 0751 06/29/18 0733 06/29/18 0855 06/30/18 0758 07/01/18 0807  GLUCAP 71 64* 85 71 63*   Lipid Profile: No results for input(s): CHOL, HDL, LDLCALC, TRIG, CHOLHDL, LDLDIRECT in the last 72 hours. Thyroid Function Tests: No results for  input(s): TSH, T4TOTAL, FREET4, T3FREE, THYROIDAB in the last 72 hours. Anemia Panel: No results for input(s): VITAMINB12, FOLATE, FERRITIN, TIBC, IRON, RETICCTPCT in the last 72 hours. Sepsis Labs: No results for input(s): PROCALCITON, LATICACIDVEN in the last 168 hours.  Recent Results (from the past 240 hour(s))  Culture, Urine     Status: None   Collection Time: 06/26/18 11:30 AM  Result Value Ref Range Status   Specimen Description URINE, CLEAN CATCH  Final   Special Requests NONE  Final   Culture   Final    NO GROWTH Performed at [facility], [address] [place], [place] [address]    Report Status 06/27/2018 FINAL  Final       Radiology Studies: No results found.    Scheduled Meds: . enoxaparin (LOVENOX) injection  40 mg Subcutaneous Daily  . feeding supplement (ENSURE ENLIVE)  237 mL Oral BID BM  . methadone  40 mg Oral q1800  . methadone  60 mg Oral Daily  . prenatal multivitamin  1 tablet Oral Q1200  . sodium chloride flush  10-40 mL Intracatheter Q12H  . sodium chloride flush  3 mL Intravenous Q12H   Continuous Infusions: . ampicillin (OMNIPEN) IV 2 g (07/01/18 0815)  . cefTRIAXone (ROCEPHIN)  IV Stopped (07/01/18 0700)  . vancomycin Stopped (07/01/18 0700)     LOS: 16 days    Time spent: 20 minutes   Dessa Phi, DO [facility] [email or web address] 07/01/2018, 10:00 AM

## 2018-07-02 DIAGNOSIS — M545 Low back pain: Secondary | ICD-10-CM

## 2018-07-02 LAB — CBC
HCT: 27.8 % — ABNORMAL LOW (ref 36.0–46.0)
Hemoglobin: 8.2 g/dL — ABNORMAL LOW (ref 12.0–15.0)
MCH: 27.2 pg (ref 26.0–34.0)
MCHC: 29.5 g/dL — ABNORMAL LOW (ref 30.0–36.0)
MCV: 92.1 fL (ref 80.0–100.0)
Platelets: 469 10*3/uL — ABNORMAL HIGH (ref 150–400)
RBC: 3.02 MIL/uL — ABNORMAL LOW (ref 3.87–5.11)
RDW: 17.2 % — ABNORMAL HIGH (ref 11.5–15.5)
WBC: 8.6 10*3/uL (ref 4.0–10.5)
nRBC: 0 % (ref 0.0–0.2)

## 2018-07-02 LAB — BASIC METABOLIC PANEL
Anion gap: 11 (ref 5–15)
BUN: 5 mg/dL — ABNORMAL LOW (ref 6–20)
CO2: 26 mmol/L (ref 22–32)
Calcium: 8.6 mg/dL — ABNORMAL LOW (ref 8.9–10.3)
Chloride: 96 mmol/L — ABNORMAL LOW (ref 98–111)
Creatinine, Ser: 0.52 mg/dL (ref 0.44–1.00)
GFR calc Af Amer: >60 mL/min (ref >60)
GFR calc non Af Amer: >60 mL/min (ref >60)
Glucose, Bld: 86 mg/dL (ref 70–99)
Potassium: 3.9 mmol/L (ref 3.5–5.1)
Sodium: 133 mmol/L — ABNORMAL LOW (ref 135–145)

## 2018-07-02 NOTE — Anesthesia Preprocedure Evaluation (Addendum)
Anesthesia Evaluation  Patient identified by MRN, date of birth, ID band Patient awake    Reviewed: Allergy & Precautions, NPO status , Patient's Chart, lab work & pertinent test results  History of Anesthesia Complications Negative for: history of anesthetic complications  Airway Mallampati: II  TM Distance: >3 FB Neck ROM: Full    Dental  (+) Poor Dentition, Dental Advisory Given   Pulmonary pneumonia, Current Smoker,    Pulmonary exam normal - rhonchi       Cardiovascular hypertension, + Valvular Problems/Murmurs  Rhythm:Regular + Systolic murmurs - Per CT surgery consult 06/17/18 with Lanelle Bal, MD: "Currently patient is poor candidate to consider tricuspid repair or replacement  Aggressive medical therapy".   IMPRESSIONS    1. Tricuspid regurgitation severe by color flow Doppler. There is a large mobile tricuspid valve vegetation on the anterior leaflet. The TV vegetation measures 10 mm x 20 mm.  2. The left ventricle has normal systolic function of 53-66%. The cavity size is normal. There is no left ventricular wall thickness. Echo evidence of normal diastolic filling patterns.  3. The right ventricle is moderately enlarged in size. There is normal systolic function. Right ventricular systolic pressure is normal with an estimated pressure of 43.8 mmHg.  4. Mildly dilated left atrial size.  5. Moderately dilated right atrial size.  6. The mitral valve is myxomatous. There is mild thickening. Regurgitation is not visualized by color flow Doppler.  7. Large vegetation on the tricuspid valve.  8. Myxomatous tricuspid valve.  9. Tricuspid regurgitation severe. 10. No atrial level shunt detected by color flow Doppler.   Neuro/Psych PSYCHIATRIC DISORDERS Anxiety Depression Bipolar Disorder negative neurological ROS     GI/Hepatic negative GI ROS, (+)     substance abuse  IV drug use,   Endo/Other    Renal/GU      Musculoskeletal   Abdominal   Peds  Hematology   Anesthesia Other Findings   Reproductive/Obstetrics (+) Pregnancy  - On 06/27/18, OB Verita Schneiders, MD wrote,  "From an obstetric standpoint, patient ishas a previable fetus and needs no fetal monitoring during any dental procedure.She may have all the usual dental procedures as if she were not pregnantincluding but not limited to the use ofshielded dentalX-rays and local infiltration anesthesia with Novocaine withor withoutEpinephrine.General or regional anesthesia can also be administered if needed.She mayhave the usual narcotic analgesia and antibiotics as neededor indicated before, during and after the procedure." - As of 06/29/18, a 24 hour urine protein and creatinine have been ordered to evaluate for the possibility of superimposed preeclampsia and continued plans to continue to monitor FHR about twice a week given gestation/fetal previability. (FHR dopplers at 143 bpm on 06/26/18.) ADDENDUM 07/01/18 2:34 PM: Per Dr. Harolyn Rutherford, "[facility] can be called to perform a HFR Doppler check postoperatively." She routed message to Limmie Patricia, RN as well. Per 06/30/18 note by Shelby Mattocks, MD, patient is getting FH dopplers daily with plan for routine monitoring once patient reaches viability at 24 weeks. Preeclampsia labs were negative. EDD documented as 11/01/18.                             Anesthesia Physical  Anesthesia Plan  ASA: III  Anesthesia Plan: General   Post-op Pain Management:    Induction: Intravenous  PONV Risk Score and Plan: 3 and Ondansetron, Treatment may vary due to age or medical condition and Dexamethasone  Airway Management Planned:  Nasal ETT  Additional Equipment: None  Intra-op Plan:   Post-operative Plan: Extubation in OR  Informed Consent: I have reviewed the patients History and Physical, chart, labs and discussed the procedure including the risks,  benefits and alternatives for the proposed anesthesia with the patient or authorized representative who has indicated his/her understanding and acceptance.     Dental advisory given  Plan Discussed with: CRNA and Anesthesiologist  Anesthesia Plan Comments: (  )       Anesthesia Quick Evaluation

## 2018-07-02 NOTE — Progress Notes (Signed)
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 Patient is [redacted] weeks pregnant, approx due date 11/01/2018.    Subjective: Pain in her tooth. No other complaints.     Assessment & Plan:   Principal Problem:   Bacteremia due to Enterococcus and Streptococcus- bacterial endocarditis Tricuspid valve, Multifocal pneumonia and Influenza B - -Continue IV ceftriaxone, vancomycin, ampicillin - she needs IV antibiotics through 08/03/18 - Cardiothoracic surgery consulted, currently not a surgical candidate - will have teeth removed tomorrow  Active Problems:   High-risk pregnancy  - OB RN monitoring fetal heart rate daily    Opioid use disorder, severe, dependence  - continue Methadone 60 mg in AM and 40 mg in PM  Time spent in minutes: stable DVT prophylaxis: lovenox Code Status: Full code Family Communication: none at bedside Disposition Plan:  Consultants:    ID  CT surgery  Dental surgery  OB/Gyn Procedures:   06/16/2018 echo: 1. Tricuspid regurgitation severe by color flow Doppler. There is a large mobile tricuspid valve vegetation on the anterior leaflet. The TV vegetation measures 10 mm x 20 mm. 2. The left ventricle has normal systolic function of 73-53%. The cavity size is normal. There is no left ventricular wall thickness. Echo evidence of normal diastolic filling patterns. 3. The right ventricle is moderately enlarged in size. There is normal systolic  function. Right ventricular systolic pressure is normal with an estimated pressure of 43.8 mmHg. 4. Mildly dilated left atrial size. 5. Moderately dilated right atrial size. 6. The mitral valve is myxomatous. There is mild thickening. Regurgitation is not visualized by color flow Doppler. 7. Large vegetation on the tricuspid valve. 8. Myxomatous tricuspid valve. 9. Tricuspid regurgitation severe.  10. No atrial level shunt detected by color flow Doppler. Antimicrobials:  Anti-infectives (From admission, onward)   Start     Dose/Rate Route Frequency Ordered Stop   06/26/18 0100  vancomycin (VANCOCIN) IVPB 1000 mg/200 mL premix     1,000 mg 200 mL/hr over 60 Minutes Intravenous Every 8 hours 06/25/18 1758 08/04/18 0159   06/23/18 0000  vancomycin (VANCOCIN) 1,250 mg in sodium chloride 0.9 % 250 mL IVPB  Status:  Discontinued     1,250 mg 166.7 mL/hr over 90 Minutes Intravenous Every 8 hours 06/22/18 2332 06/25/18 1758   06/20/18 1000  vancomycin (VANCOCIN) 2,000 mg in sodium chloride 0.9 % 500 mL IVPB  Status:  Discontinued     2,000 mg 250 mL/hr over 120 Minutes Intravenous Every 12 hours 06/19/18 2221 06/22/18 2332   06/18/18 2000  vancomycin (VANCOCIN) IVPB 1000 mg/200 mL premix  Status:  Discontinued     1,000 mg 200 mL/hr over 60 Minutes Intravenous Every 8 hours 06/18/18 1054 06/19/18 2221   06/18/18 1200  vancomycin (VANCOCIN) 1,750 mg in sodium chloride 0.9 % 500 mL IVPB     1,750 mg 250 mL/hr over 120 Minutes Intravenous  Once 06/18/18 1054 06/18/18 2241   06/17/18 2200  cefTRIAXone (ROCEPHIN) 2 g in sodium chloride 0.9 % 100 mL IVPB  2 g 200 mL/hr over 30 Minutes Intravenous Every 12 hours 06/17/18 1046 08/04/18 0959   06/17/18 1200  ampicillin (OMNIPEN) 2 g in sodium chloride 0.9 % 100 mL IVPB     2 g 300 mL/hr over 20 Minutes Intravenous Every 4 hours 06/17/18 1045 08/03/18 2359   06/16/18 1300  oseltamivir (TAMIFLU) capsule 75 mg     75 mg Oral 2 times daily  06/16/18 1231 06/20/18 0919   06/15/18 0700  vancomycin (VANCOCIN) IVPB 1000 mg/200 mL premix  Status:  Discontinued     1,000 mg 200 mL/hr over 60 Minutes Intravenous Every 8 hours 06/14/18 2216 06/15/18 0711   06/15/18 0500  cefTRIAXone (ROCEPHIN) 2 g in sodium chloride 0.9 % 100 mL IVPB  Status:  Discontinued     2 g 200 mL/hr over 30 Minutes Intravenous Every 24 hours 06/15/18 0445 06/17/18 1046   06/14/18 2200  azithromycin (ZITHROMAX) 500 mg in sodium chloride 0.9 % 250 mL IVPB  Status:  Discontinued     500 mg 250 mL/hr over 60 Minutes Intravenous Every 24 hours 06/14/18 2134 06/15/18 0711   06/14/18 2200  cefTRIAXone (ROCEPHIN) 1 g in sodium chloride 0.9 % 100 mL IVPB  Status:  Discontinued     1 g 200 mL/hr over 30 Minutes Intravenous Every 24 hours 06/14/18 2134 06/15/18 0445   06/14/18 2200  vancomycin (VANCOCIN) 1,500 mg in sodium chloride 0.9 % 500 mL IVPB     1,500 mg 250 mL/hr over 120 Minutes Intravenous  Once 06/14/18 2134 06/15/18 0235       Objective: Vitals:   07/01/18 1811 07/01/18 2357 07/02/18 0500 07/02/18 0741  BP: 123/89 125/86  115/88  Pulse: 82 85  90  Resp:  18  16  Temp: 98 F (36.7 C) (!) 97.5 F (36.4 C)  98.3 F (36.8 C)  TempSrc:  Oral  Oral  SpO2: 97% 96%  97%  Weight:   84.5 kg   Height:        Intake/Output Summary (Last 24 hours) at 07/02/2018 1359 Last data filed at 07/02/2018 0900 Gross per 24 hour  Intake 1040 ml  Output 2500 ml  Net -1460 ml   Filed Weights   06/25/18 0500 06/30/18 0300 07/02/18 0500  Weight: 91.5 kg 86.9 kg 84.5 kg    Examination: General exam: Appears comfortable  HEENT: PERRLA, oral mucosa moist, no sclera icterus or thrush Respiratory system: Clear to auscultation. Respiratory effort normal. Cardiovascular system: S1 & S2 heard, RRR.   Gastrointestinal system: Abdomen soft, non-tender, nondistended. Normal bowel sounds. Central nervous system: Alert and oriented. No focal neurological  deficits. Extremities: No cyanosis, clubbing or edema Skin: No rashes or ulcers Psychiatry:  Mood & affect appropriate.     Data Reviewed: I have personally reviewed following labs and imaging studies  CBC: Recent Labs  Lab 07/02/18 0540  WBC 8.6  HGB 8.2*  HCT 27.8*  MCV 92.1  PLT 326*   Basic Metabolic Panel: Recent Labs  Lab 06/29/18 0509 07/02/18 0540  NA 133*  132* 133*  K 4.3  4.2 3.9  CL 96*  95* 96*  CO2 26  27 26   GLUCOSE 74  77 86  BUN <5*  <5* 5*  CREATININE 0.45  0.42* 0.52  CALCIUM 8.4*  8.4* 8.6*   GFR: Estimated Creatinine Clearance: 111.8 mL/min (by C-G formula based on SCr of 0.52 mg/dL). Liver Function Tests: Recent Labs  Lab 06/29/18 0509  AST 35  ALT 12  ALKPHOS 240*  BILITOT 1.2  PROT 6.7  ALBUMIN 1.5*   No results for input(s): LIPASE, AMYLASE in the last 168 hours. No results for input(s): AMMONIA in the last 168 hours. Coagulation Profile: No results for input(s): INR, PROTIME in the last 168 hours. Cardiac Enzymes: No results for input(s): CKTOTAL, CKMB, CKMBINDEX, TROPONINI in the last 168 hours. BNP (last 3 results) No results for input(s): PROBNP in the last 8760 hours. HbA1C: No results for input(s): HGBA1C in the last 72 hours. CBG: Recent Labs  Lab 06/28/18 0751 06/29/18 0733 06/29/18 0855 06/30/18 0758 07/01/18 0807  GLUCAP 71 64* 85 71 63*   Lipid Profile: No results for input(s): CHOL, HDL, LDLCALC, TRIG, CHOLHDL, LDLDIRECT in the last 72 hours. Thyroid Function Tests: No results for input(s): TSH, T4TOTAL, FREET4, T3FREE, THYROIDAB in the last 72 hours. Anemia Panel: No results for input(s): VITAMINB12, FOLATE, FERRITIN, TIBC, IRON, RETICCTPCT in the last 72 hours. Urine analysis:    Component Value Date/Time   COLORURINE YELLOW 06/26/2018 1130   APPEARANCEUR CLEAR 06/26/2018 1130   APPEARANCEUR Hazy 07/10/2013 1121   LABSPEC 1.006 06/26/2018 1130   LABSPEC 1.020 07/10/2013 1121   PHURINE 7.0  06/26/2018 1130   GLUCOSEU NEGATIVE 06/26/2018 1130   GLUCOSEU Negative 07/10/2013 1121   HGBUR NEGATIVE 06/26/2018 1130   BILIRUBINUR NEGATIVE 06/26/2018 1130   BILIRUBINUR Negative 07/10/2013 1121   KETONESUR NEGATIVE 06/26/2018 1130   PROTEINUR NEGATIVE 06/26/2018 1130   UROBILINOGEN 1.0 02/15/2015 0530   NITRITE NEGATIVE 06/26/2018 1130   LEUKOCYTESUR NEGATIVE 06/26/2018 1130   LEUKOCYTESUR Trace 07/10/2013 1121   Sepsis Labs: @LABRCNTIP (procalcitonin:4,lacticidven:4) ) Recent Results (from the past 240 hour(s))  Culture, Urine     Status: None   Collection Time: 06/26/18 11:30 AM  Result Value Ref Range Status   Specimen Description URINE, CLEAN CATCH  Final   Special Requests NONE  Final   Culture   Final    NO GROWTH Performed at [facility], [place] [address], [place], [place] [address]    Report Status 06/27/2018 FINAL  Final         Radiology Studies: No results found.    Scheduled Meds: . feeding supplement (ENSURE ENLIVE)  237 mL Oral BID BM  . methadone  40 mg Oral q1800  . methadone  60 mg Oral Daily  . prenatal multivitamin  1 tablet Oral Q1200  . sodium chloride flush  10-40 mL Intracatheter Q12H  . sodium chloride flush  3 mL Intravenous Q12H   Continuous Infusions: . ampicillin (OMNIPEN) IV 2 g (07/02/18 1219)  . cefTRIAXone (ROCEPHIN)  IV 2 g (07/02/18 0913)  . vancomycin 1,000 mg (07/02/18 0911)     LOS: 17 days      Debbe Odea, MD [facility] Pager: [email or web address] Password [facility] 07/02/2018, 1:59 PM

## 2018-07-03 ENCOUNTER — Inpatient Hospital Stay (HOSPITAL_COMMUNITY): Payer: Medicaid Other | Admitting: Vascular Surgery

## 2018-07-03 ENCOUNTER — Encounter (HOSPITAL_COMMUNITY)
Admission: EM | Disposition: A | Discharge status: Home / Self Care | Payer: Self-pay | Source: Home / Self Care | Attending: Internal Medicine

## 2018-07-03 DIAGNOSIS — K045 Chronic apical periodontitis: Secondary | ICD-10-CM | POA: Diagnosis present

## 2018-07-03 DIAGNOSIS — K029 Dental caries, unspecified: Secondary | ICD-10-CM | POA: Diagnosis present

## 2018-07-03 DIAGNOSIS — K0401 Reversible pulpitis: Secondary | ICD-10-CM | POA: Diagnosis present

## 2018-07-03 DIAGNOSIS — F40232 Fear of other medical care: Secondary | ICD-10-CM | POA: Diagnosis present

## 2018-07-03 HISTORY — PX: MULTIPLE EXTRACTIONS WITH ALVEOLOPLASTY: SHX5342

## 2018-07-03 SURGERY — MULTIPLE EXTRACTION WITH ALVEOLOPLASTY
Anesthesia: General | Laterality: Bilateral

## 2018-07-03 MED ORDER — PROPOFOL 10 MG/ML IV BOLUS
INTRAVENOUS | Status: AC
  Filled 2018-07-03: qty 40

## 2018-07-03 MED ORDER — PHENYLEPHRINE 40 MCG/ML (10ML) SYRINGE FOR IV PUSH (FOR BLOOD PRESSURE SUPPORT)
PREFILLED_SYRINGE | INTRAVENOUS | Status: AC
  Filled 2018-07-03: qty 10

## 2018-07-03 MED ORDER — FENTANYL CITRATE (PF) 100 MCG/2ML IJ SOLN: 25.0000 ug | INTRAMUSCULAR | Status: DC | PRN

## 2018-07-03 MED ORDER — LIDOCAINE 2% (20 MG/ML) 5 ML SYRINGE
INTRAMUSCULAR | Status: DC | PRN
  Administered 2018-07-03: 100 mg via INTRAVENOUS

## 2018-07-03 MED ORDER — IBUPROFEN 200 MG PO TABS
400.0000 mg | ORAL_TABLET | Freq: Four times a day (QID) | ORAL | Status: DC
  Administered 2018-07-03 – 2018-07-10 (×28): 400 mg via ORAL
  Filled 2018-07-03 (×28): qty 2

## 2018-07-03 MED ORDER — BUPIVACAINE-EPINEPHRINE 0.5% -1:200000 IJ SOLN
INTRAMUSCULAR | Status: DC | PRN
  Administered 2018-07-03: 1.8 mL

## 2018-07-03 MED ORDER — LIDOCAINE 2% (20 MG/ML) 5 ML SYRINGE
INTRAMUSCULAR | Status: AC
  Filled 2018-07-03: qty 5

## 2018-07-03 MED ORDER — PROPOFOL 10 MG/ML IV BOLUS
INTRAVENOUS | Status: DC | PRN
  Administered 2018-07-03: 50 mg via INTRAVENOUS
  Administered 2018-07-03: 150 mg via INTRAVENOUS

## 2018-07-03 MED ORDER — DEXAMETHASONE SODIUM PHOSPHATE 10 MG/ML IJ SOLN
INTRAMUSCULAR | Status: DC | PRN
  Administered 2018-07-03: 10 mg via INTRAVENOUS

## 2018-07-03 MED ORDER — OXYMETAZOLINE HCL 0.05 % NA SOLN
NASAL | Status: AC
  Filled 2018-07-03: qty 30

## 2018-07-03 MED ORDER — PROMETHAZINE HCL 25 MG/ML IJ SOLN: 6.2500 mg | INTRAMUSCULAR | Status: DC | PRN

## 2018-07-03 MED ORDER — LACTATED RINGERS IV SOLN
INTRAVENOUS | Status: DC | PRN
  Administered 2018-07-03: 07:00:00 via INTRAVENOUS

## 2018-07-03 MED ORDER — SUCCINYLCHOLINE CHLORIDE 200 MG/10ML IV SOSY
PREFILLED_SYRINGE | INTRAVENOUS | Status: AC
  Filled 2018-07-03: qty 10

## 2018-07-03 MED ORDER — SUCCINYLCHOLINE CHLORIDE 200 MG/10ML IV SOSY
PREFILLED_SYRINGE | INTRAVENOUS | Status: DC | PRN
  Administered 2018-07-03: 100 mg via INTRAVENOUS

## 2018-07-03 MED ORDER — ONDANSETRON HCL 4 MG/2ML IJ SOLN
INTRAMUSCULAR | Status: DC | PRN
  Administered 2018-07-03: 4 mg via INTRAVENOUS

## 2018-07-03 MED ORDER — LACTATED RINGERS IV SOLN: INTRAVENOUS | Status: DC

## 2018-07-03 MED ORDER — ONDANSETRON HCL 4 MG/2ML IJ SOLN
INTRAMUSCULAR | Status: AC
  Filled 2018-07-03: qty 2

## 2018-07-03 MED ORDER — LIDOCAINE-EPINEPHRINE 2 %-1:100000 IJ SOLN
INTRAMUSCULAR | Status: AC
  Filled 2018-07-03: qty 10.2

## 2018-07-03 MED ORDER — LIDOCAINE-EPINEPHRINE 2 %-1:100000 IJ SOLN
INTRAMUSCULAR | Status: DC | PRN
  Administered 2018-07-03: 1.7 mL via INTRADERMAL

## 2018-07-03 MED ORDER — ACETAMINOPHEN 500 MG PO TABS
1000.0000 mg | ORAL_TABLET | Freq: Once | ORAL | Status: DC
  Filled 2018-07-03: qty 2

## 2018-07-03 MED ORDER — ACETAMINOPHEN 500 MG PO TABS
1000.0000 mg | ORAL_TABLET | Freq: Four times a day (QID) | ORAL | Status: DC
  Administered 2018-07-03 (×3): 1000 mg via ORAL
  Filled 2018-07-03 (×4): qty 2

## 2018-07-03 MED ORDER — FENTANYL CITRATE (PF) 250 MCG/5ML IJ SOLN
INTRAMUSCULAR | Status: AC
  Filled 2018-07-03: qty 5

## 2018-07-03 MED ORDER — SODIUM CHLORIDE 0.9 % IV SOLN
INTRAVENOUS | Status: DC | PRN
  Administered 2018-07-03: 50 ug/min via INTRAVENOUS

## 2018-07-03 MED ORDER — DEXAMETHASONE SODIUM PHOSPHATE 10 MG/ML IJ SOLN
INTRAMUSCULAR | Status: AC
  Filled 2018-07-03: qty 1

## 2018-07-03 MED ORDER — EPHEDRINE 5 MG/ML INJ
INTRAVENOUS | Status: AC
  Filled 2018-07-03: qty 10

## 2018-07-03 MED ORDER — FENTANYL CITRATE (PF) 250 MCG/5ML IJ SOLN
INTRAMUSCULAR | Status: DC | PRN
  Administered 2018-07-03 (×2): 50 ug via INTRAVENOUS
  Administered 2018-07-03 (×2): 25 ug via INTRAVENOUS
  Administered 2018-07-03: 100 ug via INTRAVENOUS

## 2018-07-03 MED ORDER — BUPIVACAINE-EPINEPHRINE (PF) 0.5% -1:200000 IJ SOLN
INTRAMUSCULAR | Status: AC
  Filled 2018-07-03: qty 10.8

## 2018-07-03 MED ORDER — 0.9 % SODIUM CHLORIDE (POUR BTL) OPTIME
TOPICAL | Status: DC | PRN
  Administered 2018-07-03: 1000 mL

## 2018-07-03 SURGICAL SUPPLY — 40 items
ALCOHOL 70% 16 OZ (MISCELLANEOUS) ×3 IMPLANT
ATTRACTOMAT 16X20 MAGNETIC DRP (DRAPES) ×3 IMPLANT
BANDAGE HEMOSTAT MRDH 4X4 STRL (MISCELLANEOUS) IMPLANT
BLADE SURG 15 STRL LF DISP TIS (BLADE) ×2 IMPLANT
BLADE SURG 15 STRL SS (BLADE) ×4
BNDG HEMOSTAT MRDH 4X4 STRL (MISCELLANEOUS)
COVER SURGICAL LIGHT HANDLE (MISCELLANEOUS) ×3 IMPLANT
COVER WAND RF STERILE (DRAPES) ×3 IMPLANT
GAUZE 4X4 16PLY RFD (DISPOSABLE) ×3 IMPLANT
GAUZE PACKING FOLDED 2  STR (GAUZE/BANDAGES/DRESSINGS) ×2
GAUZE PACKING FOLDED 2 STR (GAUZE/BANDAGES/DRESSINGS) ×1 IMPLANT
GLOVE BIO SURGEON STRL SZ 6.5 (GLOVE) ×2 IMPLANT
GLOVE BIO SURGEONS STRL SZ 6.5 (GLOVE) ×1
GLOVE SURG ORTHO 8.0 STRL STRW (GLOVE) ×3 IMPLANT
GOWN STRL REUS W/ TWL LRG LVL3 (GOWN DISPOSABLE) ×1 IMPLANT
GOWN STRL REUS W/TWL 2XL LVL3 (GOWN DISPOSABLE) ×3 IMPLANT
GOWN STRL REUS W/TWL LRG LVL3 (GOWN DISPOSABLE) ×2
HEMOSTAT SURGICEL 2X14 (HEMOSTASIS) IMPLANT
KIT BASIN OR (CUSTOM PROCEDURE TRAY) ×3 IMPLANT
KIT TURNOVER KIT B (KITS) ×3 IMPLANT
MANIFOLD NEPTUNE WASTE (CANNULA) ×3 IMPLANT
NEEDLE BLUNT 16X1.5 OR ONLY (NEEDLE) IMPLANT
NEEDLE DENTAL 27 LONG (NEEDLE) IMPLANT
NS IRRIG 1000ML POUR BTL (IV SOLUTION) ×3 IMPLANT
PACK EENT II TURBAN DRAPE (CUSTOM PROCEDURE TRAY) ×3 IMPLANT
PAD ARMBOARD 7.5X6 YLW CONV (MISCELLANEOUS) ×3 IMPLANT
SPONGE SURGIFOAM ABS GEL 100 (HEMOSTASIS) IMPLANT
SPONGE SURGIFOAM ABS GEL 12-7 (HEMOSTASIS) IMPLANT
SPONGE SURGIFOAM ABS GEL SZ50 (HEMOSTASIS) IMPLANT
SUCTION FRAZIER HANDLE 10FR (MISCELLANEOUS) ×2
SUCTION TUBE FRAZIER 10FR DISP (MISCELLANEOUS) ×1 IMPLANT
SUT CHROMIC 3 0 PS 2 (SUTURE) ×6 IMPLANT
SUT CHROMIC 4 0 P 3 18 (SUTURE) IMPLANT
SYR 50ML SLIP (SYRINGE) ×3 IMPLANT
TOWEL NATURAL 10PK STERILE (DISPOSABLE) ×3 IMPLANT
TUBE CONNECTING 12'X1/4 (SUCTIONS) ×1
TUBE CONNECTING 12X1/4 (SUCTIONS) ×2 IMPLANT
WATER STERILE IRR 1000ML POUR (IV SOLUTION) ×3 IMPLANT
WATER TABLETS ICX (MISCELLANEOUS) ×3 IMPLANT
YANKAUER SUCT BULB TIP NO VENT (SUCTIONS) ×3 IMPLANT

## 2018-07-03 NOTE — Anesthesia Postprocedure Evaluation (Signed)
Anesthesia Post Note  Patient: Kelly [name]  Procedure(s) Performed: Extraction of tooth #'s 30 and 31 with alveoloplasty (Bilateral )     Patient location during evaluation: PACU Anesthesia Type: General Level of consciousness: sedated Pain management: pain level controlled Vital Signs Assessment: post-procedure vital signs reviewed and stable Respiratory status: spontaneous breathing and respiratory function stable Cardiovascular status: stable Postop Assessment: no apparent nausea or vomiting Anesthetic complications: no    Last Vitals:  Vitals:   07/03/18 0900 07/03/18 0936  BP:  114/72  Pulse:  91  Resp:  19  Temp: 36.6 C 36.5 C  SpO2:  95%    Last Pain:  Vitals:   07/03/18 0936  TempSrc: Oral  PainSc:                  Rosalin Buster DANIEL

## 2018-07-03 NOTE — Progress Notes (Signed)
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 Patient is [redacted] weeks pregnant, approx due date 11/01/2018.    Subjective: S/p dental extraction. Has pain in her gums. No other complaints.     Assessment & Plan:   Principal Problem:   Bacteremia due to Enterococcus and Streptococcus- bacterial endocarditis Tricuspid valve, Multifocal pneumonia and Influenza B - -Continue IV ceftriaxone, vancomycin, ampicillin - she needs IV antibiotics through 08/03/18 - Cardiothoracic surgery consulted, currently not a surgical candidate - has had teeth removed today- will schedule extra strength Tylenol and Motrin QID today.   Active Problems:   High-risk pregnancy  - OB RN monitoring fetal heart rate daily    Opioid use disorder, severe, dependence  - continue Methadone 60 mg in AM and 40 mg in PM  Time spent in minutes: stable DVT prophylaxis: lovenox Code Status: Full code Family Communication: none at bedside Disposition Plan:  Consultants:    ID  CT surgery  Dental surgery  OB/Gyn Procedures:   06/16/2018 echo: 1. Tricuspid regurgitation severe by color flow Doppler. There is a large mobile tricuspid valve vegetation on the anterior leaflet. The TV vegetation measures 10 mm x 20 mm. 2. The left ventricle has normal systolic function of 35-00%. The cavity size is normal. There is no left ventricular wall thickness. Echo evidence of normal diastolic filling patterns. 3. The  right ventricle is moderately enlarged in size. There is normal systolic function. Right ventricular systolic pressure is normal with an estimated pressure of 43.8 mmHg. 4. Mildly dilated left atrial size. 5. Moderately dilated right atrial size. 6. The mitral valve is myxomatous. There is mild thickening. Regurgitation is not visualized by color flow Doppler. 7. Large vegetation on the tricuspid valve. 8. Myxomatous tricuspid valve. 9. Tricuspid regurgitation severe.  10. No atrial level shunt detected by color flow Doppler. Antimicrobials:  Anti-infectives (From admission, onward)   Start     Dose/Rate Route Frequency Ordered Stop   06/26/18 0100  vancomycin (VANCOCIN) IVPB 1000 mg/200 mL premix     1,000 mg 200 mL/hr over 60 Minutes Intravenous Every 8 hours 06/25/18 1758 08/04/18 0159   06/23/18 0000  vancomycin (VANCOCIN) 1,250 mg in sodium chloride 0.9 % 250 mL IVPB  Status:  Discontinued     1,250 mg 166.7 mL/hr over 90 Minutes Intravenous Every 8 hours 06/22/18 2332 06/25/18 1758   06/20/18 1000  vancomycin (VANCOCIN) 2,000 mg in sodium chloride 0.9 % 500 mL IVPB  Status:  Discontinued     2,000 mg 250 mL/hr over 120 Minutes Intravenous Every 12 hours 06/19/18 2221 06/22/18 2332   06/18/18 2000  vancomycin (VANCOCIN) IVPB 1000 mg/200 mL premix  Status:  Discontinued     1,000 mg 200 mL/hr over 60 Minutes Intravenous Every 8 hours 06/18/18 1054 06/19/18 2221   06/18/18 1200  vancomycin (VANCOCIN) 1,750 mg in sodium chloride 0.9 % 500 mL IVPB     1,750 mg 250 mL/hr over 120 Minutes Intravenous  Once 06/18/18 1054 06/18/18 2241   06/17/18 2200  cefTRIAXone (ROCEPHIN)  2 g in sodium chloride 0.9 % 100 mL IVPB     2 g 200 mL/hr over 30 Minutes Intravenous Every 12 hours 06/17/18 1046 08/04/18 0959   06/17/18 1200  ampicillin (OMNIPEN) 2 g in sodium chloride 0.9 % 100 mL IVPB     2 g 300 mL/hr over 20 Minutes Intravenous Every 4 hours 06/17/18 1045 08/03/18 2359   06/16/18  1300  oseltamivir (TAMIFLU) capsule 75 mg     75 mg Oral 2 times daily 06/16/18 1231 06/20/18 0919   06/15/18 0700  vancomycin (VANCOCIN) IVPB 1000 mg/200 mL premix  Status:  Discontinued     1,000 mg 200 mL/hr over 60 Minutes Intravenous Every 8 hours 06/14/18 2216 06/15/18 0711   06/15/18 0500  cefTRIAXone (ROCEPHIN) 2 g in sodium chloride 0.9 % 100 mL IVPB  Status:  Discontinued     2 g 200 mL/hr over 30 Minutes Intravenous Every 24 hours 06/15/18 0445 06/17/18 1046   06/14/18 2200  azithromycin (ZITHROMAX) 500 mg in sodium chloride 0.9 % 250 mL IVPB  Status:  Discontinued     500 mg 250 mL/hr over 60 Minutes Intravenous Every 24 hours 06/14/18 2134 06/15/18 0711   06/14/18 2200  cefTRIAXone (ROCEPHIN) 1 g in sodium chloride 0.9 % 100 mL IVPB  Status:  Discontinued     1 g 200 mL/hr over 30 Minutes Intravenous Every 24 hours 06/14/18 2134 06/15/18 0445   06/14/18 2200  vancomycin (VANCOCIN) 1,500 mg in sodium chloride 0.9 % 500 mL IVPB     1,500 mg 250 mL/hr over 120 Minutes Intravenous  Once 06/14/18 2134 06/15/18 0235       Objective: Vitals:   07/03/18 0847 07/03/18 0858 07/03/18 0900 07/03/18 0936  BP:    114/72  Pulse: 96 96  91  Resp: 16 18  19   Temp:   97.9 F (36.6 C) 97.7 F (36.5 C)  TempSrc:    Oral  SpO2: 95% 95%  95%  Weight:      Height:        Intake/Output Summary (Last 24 hours) at 07/03/2018 1230 Last data filed at 07/03/2018 0900 Gross per 24 hour  Intake 950 ml  Output 15 ml  Net 935 ml   Filed Weights   06/30/18 0300 07/02/18 0500 07/03/18 0500  Weight: 86.9 kg 84.5 kg 84.5 kg    Examination: General exam: Appears comfortable  HEENT: PERRLA, oral mucosa moist, no sclera icterus or thrush Respiratory system: Clear to auscultation. Respiratory effort normal. Cardiovascular system: S1 & S2 heard,  No murmurs  Gastrointestinal system: Abdomen soft, non-tender, nondistended. Normal bowel sound. No organomegaly Central nervous system: Alert and  oriented. No focal neurological deficits. Extremities: No cyanosis, clubbing or edema Skin: No rashes or ulcers Psychiatry:  Mood & affect appropriate.     Data Reviewed: I have personally reviewed following labs and imaging studies  CBC: Recent Labs  Lab 07/02/18 0540  WBC 8.6  HGB 8.2*  HCT 27.8*  MCV 92.1  PLT 048*   Basic Metabolic Panel: Recent Labs  Lab 06/29/18 0509 07/02/18 0540  NA 133*  132* 133*  K 4.3  4.2 3.9  CL 96*  95* 96*  CO2 26  27 26   GLUCOSE 74  77 86  BUN <5*  <5* 5*  CREATININE 0.45  0.42* 0.52  CALCIUM 8.4*  8.4* 8.6*   GFR: Estimated Creatinine Clearance: 111.8 mL/min (by C-G formula based on SCr of 0.52 mg/dL). Liver Function Tests:  Recent Labs  Lab 06/29/18 0509  AST 35  ALT 12  ALKPHOS 240*  BILITOT 1.2  PROT 6.7  ALBUMIN 1.5*   No results for input(s): LIPASE, AMYLASE in the last 168 hours. No results for input(s): AMMONIA in the last 168 hours. Coagulation Profile: No results for input(s): INR, PROTIME in the last 168 hours. Cardiac Enzymes: No results for input(s): CKTOTAL, CKMB, CKMBINDEX, TROPONINI in the last 168 hours. BNP (last 3 results) No results for input(s): PROBNP in the last 8760 hours. HbA1C: No results for input(s): HGBA1C in the last 72 hours. CBG: Recent Labs  Lab 06/28/18 0751 06/29/18 0733 06/29/18 0855 06/30/18 0758 07/01/18 0807  GLUCAP 71 64* 85 71 63*   Lipid Profile: No results for input(s): CHOL, HDL, LDLCALC, TRIG, CHOLHDL, LDLDIRECT in the last 72 hours. Thyroid Function Tests: No results for input(s): TSH, T4TOTAL, FREET4, T3FREE, THYROIDAB in the last 72 hours. Anemia Panel: No results for input(s): VITAMINB12, FOLATE, FERRITIN, TIBC, IRON, RETICCTPCT in the last 72 hours. Urine analysis:    Component Value Date/Time   COLORURINE YELLOW 06/26/2018 1130   APPEARANCEUR CLEAR 06/26/2018 1130   APPEARANCEUR Hazy 07/10/2013 1121   LABSPEC 1.006 06/26/2018 1130   LABSPEC 1.020  07/10/2013 1121   PHURINE 7.0 06/26/2018 1130   GLUCOSEU NEGATIVE 06/26/2018 1130   GLUCOSEU Negative 07/10/2013 1121   HGBUR NEGATIVE 06/26/2018 1130   BILIRUBINUR NEGATIVE 06/26/2018 1130   BILIRUBINUR Negative 07/10/2013 1121   KETONESUR NEGATIVE 06/26/2018 1130   PROTEINUR NEGATIVE 06/26/2018 1130   UROBILINOGEN 1.0 02/15/2015 0530   NITRITE NEGATIVE 06/26/2018 1130   LEUKOCYTESUR NEGATIVE 06/26/2018 1130   LEUKOCYTESUR Trace 07/10/2013 1121   Sepsis Labs: @LABRCNTIP (procalcitonin:4,lacticidven:4) ) Recent Results (from the past 240 hour(s))  Culture, Urine     Status: None   Collection Time: 06/26/18 11:30 AM  Result Value Ref Range Status   Specimen Description URINE, CLEAN CATCH  Final   Special Requests NONE  Final   Culture   Final    NO GROWTH Performed at [facility], [place] [address] [place], [place] [address]    Report Status 06/27/2018 FINAL  Final         Radiology Studies: No results found.    Scheduled Meds: . acetaminophen  1,000 mg Oral QID  . feeding supplement (ENSURE ENLIVE)  237 mL Oral BID BM  . ibuprofen  400 mg Oral QID  . methadone  40 mg Oral q1800  . methadone  60 mg Oral Daily  . prenatal multivitamin  1 tablet Oral Q1200  . sodium chloride flush  10-40 mL Intracatheter Q12H  . sodium chloride flush  3 mL Intravenous Q12H   Continuous Infusions: . ampicillin (OMNIPEN) IV 2 g (07/03/18 1029)  . cefTRIAXone (ROCEPHIN)  IV 2 g (07/02/18 2101)  . vancomycin 1,000 mg (07/03/18 0236)     LOS: 18 days      Debbe Odea, MD [facility] Pager: [email or web address] Password [email or web address] 07/03/2018, 12:30 PM

## 2018-07-03 NOTE — Progress Notes (Signed)
Dr Rosana Hoes updated on pt status, notified that she is returning to he room at this time and that fht were dopplered at 145bpm

## 2018-07-03 NOTE — Anesthesia Procedure Notes (Signed)
Procedure Name: Intubation Date/Time: 07/03/2018 7:35 AM Performed by: Renato Shin, CRNA Pre-anesthesia Checklist: Patient identified, Emergency Drugs available, Suction available and Patient being monitored Patient Re-evaluated:Patient Re-evaluated prior to induction Oxygen Delivery Method: Circle system utilized Preoxygenation: Pre-oxygenation with 100% oxygen Induction Type: IV induction Ventilation: Mask ventilation without difficulty Laryngoscope Size: Miller and 2 Grade View: Grade I Tube type: Oral Tube size: 7.0 mm Number of attempts: 1 Airway Equipment and Method: Stylet Placement Confirmation: ETT inserted through vocal cords under direct vision,  positive ETCO2 and breath sounds checked- equal and bilateral Secured at: 21 cm Tube secured with: Tape Dental Injury: Teeth and Oropharynx as per pre-operative assessment

## 2018-07-03 NOTE — Op Note (Signed)
OPERATIVE REPORT  Patient:            Kelly [name] Date of Birth:  [birth year] MRN:                [id number]   DATE OF PROCEDURE:  07/03/2018  PREOPERATIVE DIAGNOSES: 1.  Tricuspid valve endocarditis 2.  High risk pregnancy 3.  History of acute pulpitis 4.  Chronic apical periodontitis 5.  Dental caries 6.  Dental phobia  POSTOPERATIVE DIAGNOSES: 1.  Tricuspid valve endocarditis 2.  High risk pregnancy 3.  History of acute pulpitis 4.  Chronic apical periodontitis 5.  Dental caries 6.  Dental phobia  OPERATIONS: 1. Multiple extraction of tooth numbers 30 and 31 2. One quadrants of alveoloplasty    SURGEON: Lenn Cal, DDS  ASSISTANT: Molli Posey (dental assistant)  ANESTHESIA: General anesthesia via oral endotracheal tube.  MEDICATIONS: 1.  IV antibiotic therapy per previous orders from infectious disease 2. Local anesthesia with a total utilization of 1 carpules each containing 34 mg of lidocaine with 0.017 mg of epinephrine as well as 2 carpules each containing 9 mg of bupivacaine with 0.009 mg of epinephrine.  SPECIMENS: There are 2 teeth that were discarded.  DRAINS: None  CULTURES: None  COMPLICATIONS: None  ESTIMATED BLOOD LOSS: Less than 15 MLs.  INTRAVENOUS FLUIDS: 300 MLs of Lactated ringers solution.  INDICATIONS: The patient was recently admitted and diagnosed with tricuspid valve endocarditis.  A medically necessary dental consultation was then requested to evaluate poor dentition as a source of the endocarditis.  The patient was examined and treatment planned for extraction of tooth numbers 30 and 31 with alveoloplasty in the operating room and general anesthesia.  This treatment plan was formulated to decrease the risks and complications associated with dental infection from affecting the patient's systemic health and put the patient at risk for future endocarditis.    OPERATIVE FINDINGS: Patient was examined operating room  number 4.  The teeth were identified for extraction. The patient was noted be affected by history of acute pulpitis, chronic apical periodontitis, dental caries, and dental phobia.    DESCRIPTION OF PROCEDURE: Patient was brought to the main operating room number 4. Patient was then placed in the supine position on the operating table. General anesthesia was then induced per the anesthesia team. The patient was then prepped and draped in the usual manner for dental medicine procedure. A timeout was performed. The patient was identified and procedures were verified. A throat pack was placed at this time. The oral cavity was then thoroughly examined with the findings noted above. The patient was then ready for dental medicine procedure as follows:  Local anesthesia was then administered sequentially with a total utilization of 1 carpules each containing 34 mg of lidocaine with 0.017 mg of epinephrine as well as 2 carpules  each containing 9 mg bupivacaine with 0.009 mg of epinephrine.  At this point time, the mandibular right quadrant was approached.  The patient was given an inferior alveolar nerve blocks and long buccal nerve blocks utilizing the bupivacaine with epinephrine. Further infiltration was then achieved utilizing the lidocaine with epinephrine. A 15 blade incision was then made from the distal of number 32 and extended the mesial of #29.  A surgical flap was then carefully reflected. Tooth numbers  30 and 31 were then subluxated with a series of straight elevators. Tooth numbers 30 and 31 were then removed with a 23 forceps without complication.  Sockets were curetted and compressed  appropriately.  Alveoloplasty was then performed utilizing a rongeurs and bone file to help achieve primary closure. The tissues were approximated and trimmed appropriately. The surgical sites were then irrigated with copious amounts of sterile saline.  A piece of Surgifoam was placed in each extraction socket  appropriately to aid hemostasis.  The mandibular right surgical site was then closed from the distal #32 and extended to the distal #29 utilizing 3-0 Chromic Gut suture in a continuous erupted suture technique x1.  An interrupted suture was then placed between tooth numbers 27 and 29 appropriately with 3-0 Chromic Gut material.   At this point time, the entire mouth was irrigated with copious amounts of sterile saline. The patient was examined for complications, seeing none, the dental medicine procedure was deemed to be complete. The throat pack was removed at this time. An oral airway was then placed at the request of the anesthesia team. A series of 4 x 4 gauze were placed in the mouth to aid hemostasis. The patient was then handed over to the anesthesia team for final disposition. After an appropriate amount of time, the patient was extubated and taken to the postanesthsia care unit in good condition. All counts were correct for the dental medicine procedure.   Lenn Cal, DDS.

## 2018-07-03 NOTE — Transfer of Care (Signed)
Immediate Anesthesia Transfer of Care Note  Patient: Kelly [name]  Procedure(s) Performed: Extraction of tooth #'s 30 and 31 with alveoloplasty (Bilateral )  Patient Location: PACU  Anesthesia Type:General  Level of Consciousness: awake, alert , oriented and patient cooperative  Airway & Oxygen Therapy: Patient Spontanous Breathing and Patient connected to face mask oxygen  Post-op Assessment: Report given to RN and Post -op Vital signs reviewed and stable  Post vital signs: Reviewed and stable  Last Vitals:  Vitals Value Taken Time  BP 121/76 07/03/2018  8:16 AM  Temp    Pulse 97 07/03/2018  8:20 AM  Resp 19 07/03/2018  8:20 AM  SpO2 96 % 07/03/2018  8:20 AM  Vitals shown include unvalidated device data.  Last Pain:  Vitals:   07/02/18 2256  TempSrc: Oral  PainSc:       Patients Stated Pain Goal: 2 (01/77/93 9030)  Complications: No apparent anesthesia complications

## 2018-07-03 NOTE — Progress Notes (Signed)
PRE-OPERATIVE NOTE:  07/03/2018 Kelly [name] [id number]  VITALS: BP 123/87 (BP Location: Left Arm)   Pulse 87   Temp 98.1 F (36.7 C) (Oral)   Resp 14   Ht 5' 7"  (1.702 m)   Wt 84.5 kg   LMP 12/30/2017 (Within Weeks)   SpO2 97%   BMI 29.18 kg/m   Lab Results  Component Value Date   WBC 8.6 07/02/2018   HGB 8.2 (L) 07/02/2018   HCT 27.8 (L) 07/02/2018   MCV 92.1 07/02/2018   PLT 469 (H) 07/02/2018   BMET    Component Value Date/Time   NA 133 (L) 07/02/2018 0540   NA 136 07/02/2013 0547   K 3.9 07/02/2018 0540   K 3.7 07/02/2013 0547   CL 96 (L) 07/02/2018 0540   CL 102 07/02/2013 0547   CO2 26 07/02/2018 0540   CO2 30 07/02/2013 0547   GLUCOSE 86 07/02/2018 0540   GLUCOSE 113 (H) 07/02/2013 0547   BUN 5 (L) 07/02/2018 0540   BUN 7 07/02/2013 0547   CREATININE 0.52 07/02/2018 0540   CREATININE 0.82 07/02/2013 0547   CALCIUM 8.6 (L) 07/02/2018 0540   CALCIUM 8.8 07/02/2013 0547   GFRNONAA >60 07/02/2018 0540   GFRNONAA >60 07/02/2013 0547   GFRAA >60 07/02/2018 0540   GFRAA >60 07/02/2013 0547    Lab Results  Component Value Date   INR 0.99 06/14/2018   INR 1.0 06/28/2013   No results found for: PTT   Kelly [name] presents for multiple dental extractions with alveoloplasty in the operating room and general anesthesia.     SUBJECTIVE: The patient denies any acute medical or dental changes and agrees to proceed with treatment as planned.  EXAM: No sign of acute dental changes.  ASSESSMENT: Patient is affected by history of acute pulpitis, chronic apical periodontitis, dental caries, and history of dental phobia.  PLAN: Patient agrees to proceed with treatment as planned in the operating room as previously discussed and accepts the risks, benefits, and complications of the proposed treatment the patient and her fetus. Patient is aware of the risk for bleeding, bruising, swelling, infection, pain, nerve damage, sinus involvement, root tip  fracture, mandible fracture, and the risks of complications associated with the anesthesia. Patient also is aware of the potential for other complications not mentioned above.   Lenn Cal, DDS

## 2018-07-04 ENCOUNTER — Encounter (HOSPITAL_COMMUNITY): Payer: Self-pay | Admitting: Dentistry

## 2018-07-04 DIAGNOSIS — Z3A22 22 weeks gestation of pregnancy: Secondary | ICD-10-CM

## 2018-07-04 DIAGNOSIS — O26893 Other specified pregnancy related conditions, third trimester: Secondary | ICD-10-CM

## 2018-07-04 LAB — MRSA PCR SCREENING: MRSA by PCR: NEGATIVE

## 2018-07-04 MED ORDER — ENOXAPARIN SODIUM 40 MG/0.4ML ~~LOC~~ SOLN
40.0000 mg | SUBCUTANEOUS | Status: DC
  Administered 2018-07-04 – 2018-08-03 (×31): 40 mg via SUBCUTANEOUS
  Filled 2018-07-04 (×31): qty 0.4

## 2018-07-04 NOTE — Progress Notes (Signed)
Pharmacy Antibiotic Note  Kelly [name] is a 33 y.o. female admitted on 06/14/2018 with strep and enterococcal endocarditis.  Patient continues on  IV ceftriaxone, vancomycin, ampicillin for Bacteremia due to Enterococcus and Streptococcus- bacterial endocarditis tricuspid valve, multifocal pneumonia.   Vancomycin needed as Strep sanguinis species resulted as resistant to ceftriaxone and penicillin.  These three IV antibiotics to continue x 6 weeks per ID recommendation, through 08/03/18.  Patient to stay inpatient for duration of IV antibiotic therapy.    On 2/17 vancomycin peak and trough were checked. Vancomycin AUC is therapeutic at 505.6 on vancomycin 1000 mg q8h.  Goal AUC 400-550.  Afebrile, WBC wnl on 2/19, and renal function remains stable.  Plan: Continue vancomycin 1000 mg IV q8h Continue ampicillin and Rocephin per MD Monitor renal fxn, repeat vanomycinc AUC as appropriate   Height: 5' 7"  (170.2 cm) Weight: 186 lb 4.6 oz (84.5 kg) IBW/kg (Calculated) : 61.6  Temp (24hrs), Avg:97.9 F (36.6 C), Min:97.7 F (36.5 C), Max:98.1 F (36.7 C)  Recent Labs  Lab 06/29/18 0509 06/30/18 1430 06/30/18 1740 07/02/18 0540  WBC  --   --   --  8.6  CREATININE 0.45  0.42*  --   --  0.52  VANCOTROUGH  --   --  13*  --   VANCOPEAK  --  19*  --   --     Estimated Creatinine Clearance: 111.8 mL/min (by C-G formula based on SCr of 0.52 mg/dL).    Allergies  Allergen Reactions  . Hydrocodone Itching  . Morphine And Related Nausea And Vomiting    Azithromycin 2/1>>2/2 Vancomcyin 2/1>>2/2, 2/5>>(3/22) Ceftriaxone 2/1>>(3/22) Ampicillin 2/4>>(3/22) Tamiflu 2/3>>2/7  2/6: Levels 19/11 (5 hours apart) -  on 1gm q8hh - AUC low at 389 on this dose >> dose adjusted to 2g IV q12h for estimated AUC 517 2/9: Levels 39/9 (12 hrs apart) - on Vanc 2gm IV q12h - >AUC supratherapeutic at 597 (goal 400-550) > adjusted to 1250 mg IV q8h for estimated AUC 556 - Calculated Ke 0.2185,  half-life 3.2 hrs 2/12: VP/VT 30/14, AUC 577 on 1278m q8 >> 1g q8 2/17: VP/VT 19/13, AUC 505.6 on [id number], ke 0.1198, t1/2 5.8h - cont   2/1 Blood x 2: Enterococcus faecalis (pan-S), Strep sanguinis 1/2 (R-PCN and CTX, S-Vanc) and strep mitis/oralis (R-tetra, I-PCN, S-Vanc/Clinda/CTX) 2/1 Urine: E coli - sensitive to cefazolin, ceftriaxone; R - amp, cipro, septra, unasyn, zosyn 2/3 Influenza PCR - positive B 2/3 Blood x 2: negative 2/7 RPR negative 2/7 HIV non-reactive 2/7 Rubella < 0.90 = non-immune 2/13 urine - negative 2/20 MRSA PCR: negative  Thank you for allowing pharmacy to be part of this patients care team.  RuNicole CellaRPExeterharmacist [phone] check AMION for all [facility] numbers After 10:00 PM, call [place]/21/2020 3:50 PM

## 2018-07-04 NOTE — Progress Notes (Signed)
PROGRESS NOTE    Kelly [name]   RSW:[id number]  DOB: 28-Mar-1986  DOA: 06/14/2018 PCP: [facility]   Brief Narrative:  Kelly [name] is a 33 year old with past medical history relevant for IV heroin and amphetamine abuse complicated by tricuspid valve endocarditis secondary to MSSA complicated by epidural abscess status post drainage and laminectomy approximately 1 year ago who was admitted on 06/14/2018 and found to have bacteremia secondary to tricuspid valve endocarditis with enterococcus, Streptococcus mitis/oralis. Patient is [redacted] weeks pregnant, approx due date 11/01/2018.    Subjective: She has no complaints today.    Assessment & Plan:   Principal Problem:   Bacteremia due to Enterococcus and Streptococcus- bacterial endocarditis Tricuspid valve, Multifocal pneumonia and Influenza B - -Continue IV ceftriaxone, vancomycin, ampicillin - she needs IV antibiotics through 08/03/18 - Cardiothoracic surgery consulted, currently not a surgical candidate - has had teeth removed on 2/20  Active Problems:   High-risk pregnancy  - OB RN monitoring fetal heart rate daily    Opioid use disorder, severe, dependence  - continue Methadone 60 mg in AM and 40 mg in PM  Time spent in minutes: stable DVT prophylaxis: lovenox Code Status: Full code Family Communication: none at bedside Disposition Plan:  Consultants:    ID  CT surgery  Dental surgery  OB/Gyn Procedures:   06/16/2018 echo: 1. Tricuspid regurgitation severe by color flow Doppler. There is a large mobile tricuspid valve vegetation on the anterior leaflet. The TV vegetation measures 10 mm x 20 mm. 2. The left ventricle has normal systolic function of 09-38%. The cavity size is normal. There is no left ventricular wall thickness. Echo evidence of normal diastolic filling patterns. 3. The right ventricle is moderately enlarged in size. There is normal systolic function. Right  ventricular systolic pressure is normal with an estimated pressure of 43.8 mmHg. 4. Mildly dilated left atrial size. 5. Moderately dilated right atrial size. 6. The mitral valve is myxomatous. There is mild thickening. Regurgitation is not visualized by color flow Doppler. 7. Large vegetation on the tricuspid valve. 8. Myxomatous tricuspid valve. 9. Tricuspid regurgitation severe.  10. No atrial level shunt detected by color flow Doppler. Antimicrobials:  Anti-infectives (From admission, onward)   Start     Dose/Rate Route Frequency Ordered Stop   06/26/18 0100  vancomycin (VANCOCIN) IVPB 1000 mg/200 mL premix     1,000 mg 200 mL/hr over 60 Minutes Intravenous Every 8 hours 06/25/18 1758 08/04/18 0159   06/23/18 0000  vancomycin (VANCOCIN) 1,250 mg in sodium chloride 0.9 % 250 mL IVPB  Status:  Discontinued     1,250 mg 166.7 mL/hr over 90 Minutes Intravenous Every 8 hours 06/22/18 2332 06/25/18 1758   06/20/18 1000  vancomycin (VANCOCIN) 2,000 mg in sodium chloride 0.9 % 500 mL IVPB  Status:  Discontinued     2,000 mg 250 mL/hr over 120 Minutes Intravenous Every 12 hours 06/19/18 2221 06/22/18 2332   06/18/18 2000  vancomycin (VANCOCIN) IVPB 1000 mg/200 mL premix  Status:  Discontinued     1,000 mg 200 mL/hr over 60 Minutes Intravenous Every 8 hours 06/18/18 1054 06/19/18 2221   06/18/18 1200  vancomycin (VANCOCIN) 1,750 mg in sodium chloride 0.9 % 500 mL IVPB     1,750 mg 250 mL/hr over 120 Minutes Intravenous  Once 06/18/18 1054 06/18/18 2241   06/17/18 2200  cefTRIAXone (ROCEPHIN) 2 g in sodium chloride 0.9 % 100 mL IVPB     2 g  200 mL/hr over 30 Minutes Intravenous Every 12 hours 06/17/18 1046 08/04/18 0959   06/17/18 1200  ampicillin (OMNIPEN) 2 g in sodium chloride 0.9 % 100 mL IVPB     2 g 300 mL/hr over 20 Minutes Intravenous Every 4 hours 06/17/18 1045 08/03/18 2359   06/16/18 1300  oseltamivir (TAMIFLU) capsule 75 mg     75 mg Oral 2 times daily 06/16/18 1231  06/20/18 0919   06/15/18 0700  vancomycin (VANCOCIN) IVPB 1000 mg/200 mL premix  Status:  Discontinued     1,000 mg 200 mL/hr over 60 Minutes Intravenous Every 8 hours 06/14/18 2216 06/15/18 0711   06/15/18 0500  cefTRIAXone (ROCEPHIN) 2 g in sodium chloride 0.9 % 100 mL IVPB  Status:  Discontinued     2 g 200 mL/hr over 30 Minutes Intravenous Every 24 hours 06/15/18 0445 06/17/18 1046   06/14/18 2200  azithromycin (ZITHROMAX) 500 mg in sodium chloride 0.9 % 250 mL IVPB  Status:  Discontinued     500 mg 250 mL/hr over 60 Minutes Intravenous Every 24 hours 06/14/18 2134 06/15/18 0711   06/14/18 2200  cefTRIAXone (ROCEPHIN) 1 g in sodium chloride 0.9 % 100 mL IVPB  Status:  Discontinued     1 g 200 mL/hr over 30 Minutes Intravenous Every 24 hours 06/14/18 2134 06/15/18 0445   06/14/18 2200  vancomycin (VANCOCIN) 1,500 mg in sodium chloride 0.9 % 500 mL IVPB     1,500 mg 250 mL/hr over 120 Minutes Intravenous  Once 06/14/18 2134 06/15/18 0235       Objective: Vitals:   07/03/18 0936 07/03/18 1715 07/03/18 2331 07/04/18 0937  BP: 114/72 110/82 116/84 137/87  Pulse: 91 74 73 71  Resp: 19 19 17    Temp: 97.7 F (36.5 C) 97.9 F (36.6 C) 98.1 F (36.7 C) 97.7 F (36.5 C)  TempSrc: Oral Oral Oral Axillary  SpO2: 95% 97% 96% 100%  Weight:      Height:        Intake/Output Summary (Last 24 hours) at 07/04/2018 1031 Last data filed at 07/04/2018 0700 Gross per 24 hour  Intake 2303.12 ml  Output -  Net 2303.12 ml   Filed Weights   06/30/18 0300 07/02/18 0500 07/03/18 0500  Weight: 86.9 kg 84.5 kg 84.5 kg    Examination: General exam: Appears comfortable  HEENT: PERRLA, oral mucosa moist, no sclera icterus or thrush Respiratory system: Clear to auscultation. Respiratory effort normal. Cardiovascular system: S1 & S2 heard,  No murmurs  Gastrointestinal system: Abdomen soft, non-tender, nondistended. Normal bowel sounds   Central nervous system: Alert and oriented. No focal  neurological deficits. Extremities: No cyanosis, clubbing or edema Skin: No rashes or ulcers Psychiatry:  Mood & affect appropriate.     Data Reviewed: I have personally reviewed following labs and imaging studies  CBC: Recent Labs  Lab 07/02/18 0540  WBC 8.6  HGB 8.2*  HCT 27.8*  MCV 92.1  PLT 945*   Basic Metabolic Panel: Recent Labs  Lab 06/29/18 0509 07/02/18 0540  NA 133*  132* 133*  K 4.3  4.2 3.9  CL 96*  95* 96*  CO2 26  27 26   GLUCOSE 74  77 86  BUN <5*  <5* 5*  CREATININE 0.45  0.42* 0.52  CALCIUM 8.4*  8.4* 8.6*   GFR: Estimated Creatinine Clearance: 111.8 mL/min (by C-G formula based on SCr of 0.52 mg/dL). Liver Function Tests: Recent Labs  Lab 06/29/18 0509  AST 35  ALT  12  ALKPHOS 240*  BILITOT 1.2  PROT 6.7  ALBUMIN 1.5*   No results for input(s): LIPASE, AMYLASE in the last 168 hours. No results for input(s): AMMONIA in the last 168 hours. Coagulation Profile: No results for input(s): INR, PROTIME in the last 168 hours. Cardiac Enzymes: No results for input(s): CKTOTAL, CKMB, CKMBINDEX, TROPONINI in the last 168 hours. BNP (last 3 results) No results for input(s): PROBNP in the last 8760 hours. HbA1C: No results for input(s): HGBA1C in the last 72 hours. CBG: Recent Labs  Lab 06/28/18 0751 06/29/18 0733 06/29/18 0855 06/30/18 0758 07/01/18 0807  GLUCAP 71 64* 85 71 63*   Lipid Profile: No results for input(s): CHOL, HDL, LDLCALC, TRIG, CHOLHDL, LDLDIRECT in the last 72 hours. Thyroid Function Tests: No results for input(s): TSH, T4TOTAL, FREET4, T3FREE, THYROIDAB in the last 72 hours. Anemia Panel: No results for input(s): VITAMINB12, FOLATE, FERRITIN, TIBC, IRON, RETICCTPCT in the last 72 hours. Urine analysis:    Component Value Date/Time   COLORURINE YELLOW 06/26/2018 1130   APPEARANCEUR CLEAR 06/26/2018 1130   APPEARANCEUR Hazy 07/10/2013 1121   LABSPEC 1.006 06/26/2018 1130   LABSPEC 1.020 07/10/2013 1121    PHURINE 7.0 06/26/2018 1130   GLUCOSEU NEGATIVE 06/26/2018 1130   GLUCOSEU Negative 07/10/2013 1121   HGBUR NEGATIVE 06/26/2018 1130   BILIRUBINUR NEGATIVE 06/26/2018 1130   BILIRUBINUR Negative 07/10/2013 1121   KETONESUR NEGATIVE 06/26/2018 1130   PROTEINUR NEGATIVE 06/26/2018 1130   UROBILINOGEN 1.0 02/15/2015 0530   NITRITE NEGATIVE 06/26/2018 1130   LEUKOCYTESUR NEGATIVE 06/26/2018 1130   LEUKOCYTESUR Trace 07/10/2013 1121   Sepsis Labs: @LABRCNTIP (procalcitonin:4,lacticidven:4) ) Recent Results (from the past 240 hour(s))  Culture, Urine     Status: None   Collection Time: 06/26/18 11:30 AM  Result Value Ref Range Status   Specimen Description URINE, CLEAN CATCH  Final   Special Requests NONE  Final   Culture   Final    NO GROWTH Performed at [facility], [place] [address], [place], [place] [address]    Report Status 06/27/2018 FINAL  Final  MRSA PCR Screening     Status: None   Collection Time: 07/03/18 11:03 PM  Result Value Ref Range Status   MRSA by PCR NEGATIVE NEGATIVE Final    Comment:        The GeneXpert MRSA Assay (FDA approved for NASAL specimens only), is one component of a comprehensive MRSA colonization surveillance program. It is not intended to diagnose MRSA infection nor to guide or monitor treatment for MRSA infections. Performed at [facility], [place] [address], [place], [place] [address]          Radiology Studies: No results found.    Scheduled Meds: . acetaminophen  1,000 mg Oral QID  . feeding supplement (ENSURE ENLIVE)  237 mL Oral BID BM  . ibuprofen  400 mg Oral QID  . methadone  40 mg Oral q1800  . methadone  60 mg Oral Daily  . prenatal multivitamin  1 tablet Oral Q1200  . sodium chloride flush  10-40 mL Intracatheter Q12H  . sodium chloride flush  3 mL Intravenous Q12H   Continuous Infusions: . ampicillin (OMNIPEN) IV 2 g (07/04/18 0916)  . cefTRIAXone (ROCEPHIN)  IV 2 g (07/04/18 0918)  . lactated ringers  10 mL/hr at 07/03/18 2101  . vancomycin 1,000 mg (07/04/18 0943)     LOS: 19 days      Debbe Odea, MD [facility] Pager: [email or web address]  Password [email or web address] 07/04/2018, 10:31 AM

## 2018-07-04 NOTE — Progress Notes (Signed)
FACULTY PRACTICE ANTEPARTUM PROGRESS NOTE  Kelly [name] is a 33 y.o. [phone] at 6w6dwho is admitted for bacteremia secondary to tricuspid vale endocarditis.  Estimated Date of Delivery: 11/01/18  Length of Stay:  19 Days. Admitted 06/14/2018  Subjective: Patient reports normal fetal movement.  She denies uterine contractions, denies bleeding and leaking of fluid per vagina. She has expressed an interest in signing paperwork to have permanent sterilization done at the time of delivery.  Vitals:  Blood pressure 137/87, pulse 71, temperature 97.7 F (36.5 C), temperature source Axillary, resp. rate 17, height 5' 7"  (1.702 m), weight 84.5 kg, last menstrual period 12/30/2017, SpO2 100 %, unknown if currently breastfeeding. Physical Examination: CONSTITUTIONAL: Well-developed, well-nourished female in no acute distress.  CARDIOVASCULAR: Normal heart rate noted, regular rhythm RESPIRATORY: Effort and breath sounds normal, no problems with respiration noted MUSCULOSKELETAL: Normal range of motion. No edema and no tenderness. ABDOMEN: Soft, nontender, nondistended, gravid. CERVIX: deferred  Fetal monitoring: FHR: 145 bpm on last doppler  No results found.  Current scheduled medications . feeding supplement (ENSURE ENLIVE)  237 mL Oral BID BM  . ibuprofen  400 mg Oral QID  . methadone  40 mg Oral q1800  . methadone  60 mg Oral Daily  . prenatal multivitamin  1 tablet Oral Q1200  . sodium chloride flush  10-40 mL Intracatheter Q12H  . sodium chloride flush  3 mL Intravenous Q12H    I have reviewed the patient's current medications.  ASSESSMENT: Principal Problem:   Bacteremia due to Enterococcus Active Problems:   Opioid use disorder, severe, dependence (HCC)   Sepsis due to pneumonia ([place]   IV drug abuse complicating pregnancy ([facility]   Multifocal pneumonia   Hyponatremia   Elevated transaminase level   Degenerative arthritis of lumbar spine   History of bacterial  endocarditis   Severe tricuspid regurgitation by prior echocardiogram   Influenza B   Rubella non-immune status, antepartum   High-risk pregnancy in second trimester   Preexisting hypertension complicating pregnancy, antepartum   Dental caries   Phobia of dental procedure   PLAN: Cont routine care - will start NST once she reaches viability at 24 weeks - will have her sign BTL papers next week  Appreciate hospitalist team care.  For OB/GYN issues, please call the [facility]Monday - Friday, 8 am - 5 pm: [phone] other times: [phone]  K. MArvilla Meres M.D. Attending [facility](Faculty Practice)  07/04/2018 1:58 PM

## 2018-07-04 NOTE — Plan of Care (Signed)
  Problem: Education: Goal: Knowledge of General Education information will improve Description Including pain rating scale, medication(s)/side effects and non-pharmacologic comfort measures Outcome: Progressing   Problem: Health Behavior/Discharge Planning: Goal: Ability to manage health-related needs will improve Outcome: Progressing   Problem: Clinical Measurements: Goal: Ability to maintain clinical measurements within normal limits will improve Outcome: Progressing   Problem: Clinical Measurements: Goal: Respiratory complications will improve Outcome: Progressing   Problem: Activity: Goal: Risk for activity intolerance will decrease Outcome: Progressing   Problem: Nutrition: Goal: Adequate nutrition will be maintained Outcome: Progressing

## 2018-07-05 MED ORDER — ACETAMINOPHEN 500 MG PO TABS
1000.0000 mg | ORAL_TABLET | Freq: Four times a day (QID) | ORAL | Status: DC
  Administered 2018-07-05 – 2018-07-10 (×20): 1000 mg via ORAL
  Filled 2018-07-05 (×19): qty 2

## 2018-07-05 NOTE — Progress Notes (Signed)
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 Patient is [redacted] weeks pregnant, approx due date 11/01/2018.    Subjective: Complaint of gums hurting from extraction.     Assessment & Plan:   Principal Problem:   Bacteremia due to Enterococcus and Streptococcus- bacterial endocarditis Tricuspid valve, Multifocal pneumonia and Influenza B - -Continue IV ceftriaxone, vancomycin, ampicillin - she needs IV antibiotics through 08/03/18 - Cardiothoracic surgery consulted, currently not a surgical candidate - has had teeth removed on 2/20  Active Problems:   High-risk pregnancy  - OB RN monitoring fetal heart rate daily    Opioid use disorder, severe, dependence  - continue Methadone 60 mg in AM and 40 mg in PM  Time spent in minutes: stable DVT prophylaxis: lovenox Code Status: Full code Family Communication: none at bedside Disposition Plan:  Consultants:    ID  CT surgery  Dental surgery  OB/Gyn Procedures:   06/16/2018 echo: 1. Tricuspid regurgitation severe by color flow Doppler. There is a large mobile tricuspid valve vegetation on the anterior leaflet. The TV vegetation measures 10 mm x 20 mm. 2. The left ventricle has normal systolic function of 26-33%. The cavity size is normal. There is no left ventricular wall thickness. Echo evidence of normal diastolic filling patterns. 3. The right ventricle is moderately enlarged in size. There is normal systolic  function. Right ventricular systolic pressure is normal with an estimated pressure of 43.8 mmHg. 4. Mildly dilated left atrial size. 5. Moderately dilated right atrial size. 6. The mitral valve is myxomatous. There is mild thickening. Regurgitation is not visualized by color flow Doppler. 7. Large vegetation on the tricuspid valve. 8. Myxomatous tricuspid valve. 9. Tricuspid regurgitation severe.  10. No atrial level shunt detected by color flow Doppler. Antimicrobials:  Anti-infectives (From admission, onward)   Start     Dose/Rate Route Frequency Ordered Stop   06/26/18 0100  vancomycin (VANCOCIN) IVPB 1000 mg/200 mL premix     1,000 mg 200 mL/hr over 60 Minutes Intravenous Every 8 hours 06/25/18 1758 08/04/18 0159   06/23/18 0000  vancomycin (VANCOCIN) 1,250 mg in sodium chloride 0.9 % 250 mL IVPB  Status:  Discontinued     1,250 mg 166.7 mL/hr over 90 Minutes Intravenous Every 8 hours 06/22/18 2332 06/25/18 1758   06/20/18 1000  vancomycin (VANCOCIN) 2,000 mg in sodium chloride 0.9 % 500 mL IVPB  Status:  Discontinued     2,000 mg 250 mL/hr over 120 Minutes Intravenous Every 12 hours 06/19/18 2221 06/22/18 2332   06/18/18 2000  vancomycin (VANCOCIN) IVPB 1000 mg/200 mL premix  Status:  Discontinued     1,000 mg 200 mL/hr over 60 Minutes Intravenous Every 8 hours 06/18/18 1054 06/19/18 2221   06/18/18 1200  vancomycin (VANCOCIN) 1,750 mg in sodium chloride 0.9 % 500 mL IVPB     1,750 mg 250 mL/hr over 120 Minutes Intravenous  Once 06/18/18 1054 06/18/18 2241   06/17/18 2200  cefTRIAXone (ROCEPHIN) 2 g in sodium chloride 0.9 % 100 mL IVPB  2 g 200 mL/hr over 30 Minutes Intravenous Every 12 hours 06/17/18 1046 08/04/18 0959   06/17/18 1200  ampicillin (OMNIPEN) 2 g in sodium chloride 0.9 % 100 mL IVPB     2 g 300 mL/hr over 20 Minutes Intravenous Every 4 hours 06/17/18 1045 08/03/18 2359   06/16/18 1300  oseltamivir (TAMIFLU) capsule 75 mg     75 mg Oral 2 times daily  06/16/18 1231 06/20/18 0919   06/15/18 0700  vancomycin (VANCOCIN) IVPB 1000 mg/200 mL premix  Status:  Discontinued     1,000 mg 200 mL/hr over 60 Minutes Intravenous Every 8 hours 06/14/18 2216 06/15/18 0711   06/15/18 0500  cefTRIAXone (ROCEPHIN) 2 g in sodium chloride 0.9 % 100 mL IVPB  Status:  Discontinued     2 g 200 mL/hr over 30 Minutes Intravenous Every 24 hours 06/15/18 0445 06/17/18 1046   06/14/18 2200  azithromycin (ZITHROMAX) 500 mg in sodium chloride 0.9 % 250 mL IVPB  Status:  Discontinued     500 mg 250 mL/hr over 60 Minutes Intravenous Every 24 hours 06/14/18 2134 06/15/18 0711   06/14/18 2200  cefTRIAXone (ROCEPHIN) 1 g in sodium chloride 0.9 % 100 mL IVPB  Status:  Discontinued     1 g 200 mL/hr over 30 Minutes Intravenous Every 24 hours 06/14/18 2134 06/15/18 0445   06/14/18 2200  vancomycin (VANCOCIN) 1,500 mg in sodium chloride 0.9 % 500 mL IVPB     1,500 mg 250 mL/hr over 120 Minutes Intravenous  Once 06/14/18 2134 06/15/18 0235       Objective: Vitals:   07/04/18 1649 07/04/18 2300 07/05/18 0500 07/05/18 0800  BP: 111/80 122/82  118/80  Pulse: 76 76    Resp: 16 16  14   Temp:  97.8 F (36.6 C)  98 F (36.7 C)  TempSrc:  Oral  Oral  SpO2: 97% 98%  98%  Weight:   89.3 kg   Height:        Intake/Output Summary (Last 24 hours) at 07/05/2018 1049 Last data filed at 07/05/2018 3267 Gross per 24 hour  Intake 1040 ml  Output -  Net 1040 ml   Filed Weights   07/02/18 0500 07/03/18 0500 07/05/18 0500  Weight: 84.5 kg 84.5 kg 89.3 kg    Examination: General exam: Appears comfortable  HEENT: PERRLA, oral mucosa moist, no sclera icterus or thrush Respiratory system: Clear to auscultation. Respiratory effort normal. Cardiovascular system: S1 & S2 heard,  No murmurs  Gastrointestinal system: Abdomen soft, non-tender, nondistended. Normal bowel sounds   Central nervous system: Alert and oriented. No focal neurological deficits. Extremities: No cyanosis,  clubbing or edema Skin: No rashes or ulcers Psychiatry:  Mood & affect appropriate.  Data Reviewed: I have personally reviewed following labs and imaging studies  CBC: Recent Labs  Lab 07/02/18 0540  WBC 8.6  HGB 8.2*  HCT 27.8*  MCV 92.1  PLT 124*   Basic Metabolic Panel: Recent Labs  Lab 06/29/18 0509 07/02/18 0540  NA 133*  132* 133*  K 4.3  4.2 3.9  CL 96*  95* 96*  CO2 26  27 26   GLUCOSE 74  77 86  BUN <5*  <5* 5*  CREATININE 0.45  0.42* 0.52  CALCIUM 8.4*  8.4* 8.6*   GFR: Estimated Creatinine Clearance: 114.8 mL/min (by C-G formula based on SCr of 0.52 mg/dL). Liver Function Tests: Recent Labs  Lab 06/29/18 0509  AST 35  ALT 12  ALKPHOS 240*  BILITOT  1.2  PROT 6.7  ALBUMIN 1.5*   No results for input(s): LIPASE, AMYLASE in the last 168 hours. No results for input(s): AMMONIA in the last 168 hours. Coagulation Profile: No results for input(s): INR, PROTIME in the last 168 hours. Cardiac Enzymes: No results for input(s): CKTOTAL, CKMB, CKMBINDEX, TROPONINI in the last 168 hours. BNP (last 3 results) No results for input(s): PROBNP in the last 8760 hours. HbA1C: No results for input(s): HGBA1C in the last 72 hours. CBG: Recent Labs  Lab 06/29/18 0733 06/29/18 0855 06/30/18 0758 07/01/18 0807  GLUCAP 64* 85 71 63*   Lipid Profile: No results for input(s): CHOL, HDL, LDLCALC, TRIG, CHOLHDL, LDLDIRECT in the last 72 hours. Thyroid Function Tests: No results for input(s): TSH, T4TOTAL, FREET4, T3FREE, THYROIDAB in the last 72 hours. Anemia Panel: No results for input(s): VITAMINB12, FOLATE, FERRITIN, TIBC, IRON, RETICCTPCT in the last 72 hours. Urine analysis:    Component Value Date/Time   COLORURINE YELLOW 06/26/2018 1130   APPEARANCEUR CLEAR 06/26/2018 1130   APPEARANCEUR Hazy 07/10/2013 1121   LABSPEC 1.006 06/26/2018 1130   LABSPEC 1.020 07/10/2013 1121   PHURINE 7.0 06/26/2018 1130   GLUCOSEU NEGATIVE 06/26/2018 1130    GLUCOSEU Negative 07/10/2013 1121   HGBUR NEGATIVE 06/26/2018 1130   BILIRUBINUR NEGATIVE 06/26/2018 1130   BILIRUBINUR Negative 07/10/2013 1121   KETONESUR NEGATIVE 06/26/2018 1130   PROTEINUR NEGATIVE 06/26/2018 1130   UROBILINOGEN 1.0 02/15/2015 0530   NITRITE NEGATIVE 06/26/2018 1130   LEUKOCYTESUR NEGATIVE 06/26/2018 1130   LEUKOCYTESUR Trace 07/10/2013 1121   Sepsis Labs: @LABRCNTIP (procalcitonin:4,lacticidven:4) ) Recent Results (from the past 240 hour(s))  Culture, Urine     Status: None   Collection Time: 06/26/18 11:30 AM  Result Value Ref Range Status   Specimen Description URINE, CLEAN CATCH  Final   Special Requests NONE  Final   Culture   Final    NO GROWTH Performed at [facility], [place] [address], [place], [place] [address]    Report Status 06/27/2018 FINAL  Final  MRSA PCR Screening     Status: None   Collection Time: 07/03/18 11:03 PM  Result Value Ref Range Status   MRSA by PCR NEGATIVE NEGATIVE Final    Comment:        The GeneXpert MRSA Assay (FDA approved for NASAL specimens only), is one component of a comprehensive MRSA colonization surveillance program. It is not intended to diagnose MRSA infection nor to guide or monitor treatment for MRSA infections. Performed at [facility], [place] [address] [place], [place] [address]          Radiology Studies: No results found.    Scheduled Meds: . enoxaparin (LOVENOX) injection  40 mg Subcutaneous Q24H  . feeding supplement (ENSURE ENLIVE)  237 mL Oral BID BM  . ibuprofen  400 mg Oral QID  . methadone  40 mg Oral q1800  . methadone  60 mg Oral Daily  . prenatal multivitamin  1 tablet Oral Q1200  . sodium chloride flush  10-40 mL Intracatheter Q12H  . sodium chloride flush  3 mL Intravenous Q12H   Continuous Infusions: . ampicillin (OMNIPEN) IV 2 g (07/05/18 0741)  . cefTRIAXone (ROCEPHIN)  IV 2 g (07/05/18 0929)  . lactated ringers 10 mL/hr at 07/03/18 2101  . vancomycin  1,000 mg (07/05/18 0928)     LOS: 20 days      Debbe Odea, MD [facility] Pager: [email or web address] Password [facility] 07/05/2018, 10:49 AM

## 2018-07-06 NOTE — Plan of Care (Signed)
  Problem: Education: Goal: Knowledge of General Education information will improve Description: Including pain rating scale, medication(s)/side effects and non-pharmacologic comfort measures Outcome: Progressing   Problem: Activity: Goal: Risk for activity intolerance will decrease Outcome: Progressing   Problem: Nutrition: Goal: Adequate nutrition will be maintained Outcome: Progressing   

## 2018-07-06 NOTE — Plan of Care (Signed)
  Problem: Education: Goal: Knowledge of General Education information will improve Description: Including pain rating scale, medication(s)/side effects and non-pharmacologic comfort measures Outcome: Progressing   Problem: Clinical Measurements: Goal: Will remain free from infection Outcome: Progressing   

## 2018-07-06 NOTE — Progress Notes (Signed)
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 Patient is [redacted] weeks pregnant, approx due date 11/01/2018.    Subjective: No complaints.    Assessment & Plan:   Principal Problem:   Bacteremia due to Enterococcus and Streptococcus- bacterial endocarditis Tricuspid valve, Multifocal pneumonia and Influenza B - -Continue IV ceftriaxone, vancomycin, ampicillin - she needs IV antibiotics through 08/03/18 - Cardiothoracic surgery consulted, currently not a surgical candidate - has had teeth removed on 2/20- no new complaints or issues today  Active Problems:   High-risk pregnancy  - OB RN monitoring fetal heart rate daily    Opioid use disorder, severe, dependence  - continue Methadone 60 mg in AM and 40 mg in PM  Time spent in minutes: stable DVT prophylaxis: lovenox Code Status: Full code Family Communication: none at bedside Disposition Plan:  Consultants:    ID  CT surgery  Dental surgery  OB/Gyn Procedures:   06/16/2018 echo: 1. Tricuspid regurgitation severe by color flow Doppler. There is a large mobile tricuspid valve vegetation on the anterior leaflet. The TV vegetation measures 10 mm x 20 mm. 2. The left ventricle has normal systolic function of 24-26%. The cavity size is normal. There is no left ventricular wall thickness. Echo evidence of normal diastolic filling patterns. 3. The right ventricle is moderately enlarged in size. There is normal systolic  function. Right ventricular systolic pressure is normal with an estimated pressure of 43.8 mmHg. 4. Mildly dilated left atrial size. 5. Moderately dilated right atrial size. 6. The mitral valve is myxomatous. There is mild thickening. Regurgitation is not visualized by color flow Doppler. 7. Large vegetation on the tricuspid valve. 8. Myxomatous tricuspid valve. 9. Tricuspid regurgitation severe.  10. No atrial level shunt detected by color flow Doppler. Antimicrobials:  Anti-infectives (From admission, onward)   Start     Dose/Rate Route Frequency Ordered Stop   06/26/18 0100  vancomycin (VANCOCIN) IVPB 1000 mg/200 mL premix     1,000 mg 200 mL/hr over 60 Minutes Intravenous Every 8 hours 06/25/18 1758 08/04/18 0159   06/23/18 0000  vancomycin (VANCOCIN) 1,250 mg in sodium chloride 0.9 % 250 mL IVPB  Status:  Discontinued     1,250 mg 166.7 mL/hr over 90 Minutes Intravenous Every 8 hours 06/22/18 2332 06/25/18 1758   06/20/18 1000  vancomycin (VANCOCIN) 2,000 mg in sodium chloride 0.9 % 500 mL IVPB  Status:  Discontinued     2,000 mg 250 mL/hr over 120 Minutes Intravenous Every 12 hours 06/19/18 2221 06/22/18 2332   06/18/18 2000  vancomycin (VANCOCIN) IVPB 1000 mg/200 mL premix  Status:  Discontinued     1,000 mg 200 mL/hr over 60 Minutes Intravenous Every 8 hours 06/18/18 1054 06/19/18 2221   06/18/18 1200  vancomycin (VANCOCIN) 1,750 mg in sodium chloride 0.9 % 500 mL IVPB     1,750 mg 250 mL/hr over 120 Minutes Intravenous  Once 06/18/18 1054 06/18/18 2241   06/17/18 2200  cefTRIAXone (ROCEPHIN) 2 g in sodium chloride 0.9 % 100 mL IVPB  2 g 200 mL/hr over 30 Minutes Intravenous Every 12 hours 06/17/18 1046 08/04/18 0959   06/17/18 1200  ampicillin (OMNIPEN) 2 g in sodium chloride 0.9 % 100 mL IVPB     2 g 300 mL/hr over 20 Minutes Intravenous Every 4 hours 06/17/18 1045 08/03/18 2359   06/16/18 1300  oseltamivir (TAMIFLU) capsule 75 mg     75 mg Oral 2 times daily  06/16/18 1231 06/20/18 0919   06/15/18 0700  vancomycin (VANCOCIN) IVPB 1000 mg/200 mL premix  Status:  Discontinued     1,000 mg 200 mL/hr over 60 Minutes Intravenous Every 8 hours 06/14/18 2216 06/15/18 0711   06/15/18 0500  cefTRIAXone (ROCEPHIN) 2 g in sodium chloride 0.9 % 100 mL IVPB  Status:  Discontinued     2 g 200 mL/hr over 30 Minutes Intravenous Every 24 hours 06/15/18 0445 06/17/18 1046   06/14/18 2200  azithromycin (ZITHROMAX) 500 mg in sodium chloride 0.9 % 250 mL IVPB  Status:  Discontinued     500 mg 250 mL/hr over 60 Minutes Intravenous Every 24 hours 06/14/18 2134 06/15/18 0711   06/14/18 2200  cefTRIAXone (ROCEPHIN) 1 g in sodium chloride 0.9 % 100 mL IVPB  Status:  Discontinued     1 g 200 mL/hr over 30 Minutes Intravenous Every 24 hours 06/14/18 2134 06/15/18 0445   06/14/18 2200  vancomycin (VANCOCIN) 1,500 mg in sodium chloride 0.9 % 500 mL IVPB     1,500 mg 250 mL/hr over 120 Minutes Intravenous  Once 06/14/18 2134 06/15/18 0235       Objective: Vitals:   07/05/18 0800 07/05/18 1645 07/05/18 2100 07/06/18 0756  BP: 118/80 119/81 130/85 (!) 146/107  Pulse:  75 72 83  Resp: 14 16 16    Temp: 98 F (36.7 C) 97.6 F (36.4 C) 98.3 F (36.8 C) 98.1 F (36.7 C)  TempSrc: Oral Oral Oral   SpO2: 98% 97% 99% 98%  Weight:      Height:        Intake/Output Summary (Last 24 hours) at 07/06/2018 0933 Last data filed at 07/05/2018 1500 Gross per 24 hour  Intake 600 ml  Output -  Net 600 ml   Filed Weights   07/02/18 0500 07/03/18 0500 07/05/18 0500  Weight: 84.5 kg 84.5 kg 89.3 kg    Examination: General exam: Appears comfortable  HEENT: PERRLA, oral mucosa moist, no sclera icterus or thrush Respiratory system: Clear to auscultation. Respiratory effort normal. Cardiovascular system: S1 & S2 heard,  No murmurs  Gastrointestinal system: Abdomen soft, non-tender, nondistended. Normal bowel sounds   Central nervous system: Alert and oriented. No focal  neurological deficits. Extremities: No cyanosis, clubbing or edema Skin: No rashes or ulcers Psychiatry:  Mood & affect appropriate.   Data Reviewed: I have personally reviewed following labs and imaging studies  CBC: Recent Labs  Lab 07/02/18 0540  WBC 8.6  HGB 8.2*  HCT 27.8*  MCV 92.1  PLT 258*   Basic Metabolic Panel: Recent Labs  Lab 07/02/18 0540  NA 133*  K 3.9  CL 96*  CO2 26  GLUCOSE 86  BUN 5*  CREATININE 0.52  CALCIUM 8.6*   GFR: Estimated Creatinine Clearance: 114.8 mL/min (by C-G formula based on SCr of 0.52 mg/dL). Liver Function Tests: No results for input(s): AST, ALT, ALKPHOS, BILITOT, PROT, ALBUMIN in the last 168 hours. No results for input(s): LIPASE, AMYLASE in the last 168 hours. No results for input(s): AMMONIA in the last 168 hours.  Coagulation Profile: No results for input(s): INR, PROTIME in the last 168 hours. Cardiac Enzymes: No results for input(s): CKTOTAL, CKMB, CKMBINDEX, TROPONINI in the last 168 hours. BNP (last 3 results) No results for input(s): PROBNP in the last 8760 hours. HbA1C: No results for input(s): HGBA1C in the last 72 hours. CBG: Recent Labs  Lab 06/30/18 0758 07/01/18 0807  GLUCAP 71 63*   Lipid Profile: No results for input(s): CHOL, HDL, LDLCALC, TRIG, CHOLHDL, LDLDIRECT in the last 72 hours. Thyroid Function Tests: No results for input(s): TSH, T4TOTAL, FREET4, T3FREE, THYROIDAB in the last 72 hours. Anemia Panel: No results for input(s): VITAMINB12, FOLATE, FERRITIN, TIBC, IRON, RETICCTPCT in the last 72 hours. Urine analysis:    Component Value Date/Time   COLORURINE YELLOW 06/26/2018 1130   APPEARANCEUR CLEAR 06/26/2018 1130   APPEARANCEUR Hazy 07/10/2013 1121   LABSPEC 1.006 06/26/2018 1130   LABSPEC 1.020 07/10/2013 1121   PHURINE 7.0 06/26/2018 1130   GLUCOSEU NEGATIVE 06/26/2018 1130   GLUCOSEU Negative 07/10/2013 1121   HGBUR NEGATIVE 06/26/2018 1130   BILIRUBINUR NEGATIVE 06/26/2018 1130     BILIRUBINUR Negative 07/10/2013 1121   KETONESUR NEGATIVE 06/26/2018 1130   PROTEINUR NEGATIVE 06/26/2018 1130   UROBILINOGEN 1.0 02/15/2015 0530   NITRITE NEGATIVE 06/26/2018 1130   LEUKOCYTESUR NEGATIVE 06/26/2018 1130   LEUKOCYTESUR Trace 07/10/2013 1121   Sepsis Labs: @LABRCNTIP (procalcitonin:4,lacticidven:4) ) Recent Results (from the past 240 hour(s))  Culture, Urine     Status: None   Collection Time: 06/26/18 11:30 AM  Result Value Ref Range Status   Specimen Description URINE, CLEAN CATCH  Final   Special Requests NONE  Final   Culture   Final    NO GROWTH Performed at [facility], [place] [address] [place], [place] [address]    Report Status 06/27/2018 FINAL  Final  MRSA PCR Screening     Status: None   Collection Time: 07/03/18 11:03 PM  Result Value Ref Range Status   MRSA by PCR NEGATIVE NEGATIVE Final    Comment:        The GeneXpert MRSA Assay (FDA approved for NASAL specimens only), is one component of a comprehensive MRSA colonization surveillance program. It is not intended to diagnose MRSA infection nor to guide or monitor treatment for MRSA infections. Performed at [facility], [place] [address], [place], [place] [address]          Radiology Studies: No results found.    Scheduled Meds: . acetaminophen  1,000 mg Oral QID  . enoxaparin (LOVENOX) injection  40 mg Subcutaneous Q24H  . feeding supplement (ENSURE ENLIVE)  237 mL Oral BID BM  . ibuprofen  400 mg Oral QID  . methadone  40 mg Oral q1800  . methadone  60 mg Oral Daily  . prenatal multivitamin  1 tablet Oral Q1200  . sodium chloride flush  10-40 mL Intracatheter Q12H  . sodium chloride flush  3 mL Intravenous Q12H   Continuous Infusions: . ampicillin (OMNIPEN) IV 2 g (07/06/18 0810)  . cefTRIAXone (ROCEPHIN)  IV 2 g (07/06/18 0913)  . lactated ringers 10 mL/hr at 07/03/18 2101  . vancomycin 1,000 mg (07/06/18 0921)     LOS: 21 days      Debbe Odea,  MD [facility] Pager: [email or web address] Password [facility] 07/06/2018, 9:33 AM

## 2018-07-07 NOTE — Progress Notes (Signed)
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 Patient is [redacted] weeks pregnant, approx due date 11/01/2018.    Subjective: No complaints today.   Assessment & Plan:   Principal Problem:   Bacteremia due to Enterococcus and Streptococcus- bacterial endocarditis Tricuspid valve, Multifocal pneumonia and Influenza B - -Continue IV ceftriaxone, vancomycin, ampicillin - she needs IV antibiotics through 08/03/18 - Cardiothoracic surgery consulted, currently not a surgical candidate - has had teeth removed on 2/20- no new complaints or issues today  Active Problems:   High-risk pregnancy  - OB RN monitoring fetal heart rate daily    Opioid use disorder, severe, dependence  - continue Methadone 60 mg in AM and 40 mg in PM  Time spent in minutes: stable DVT prophylaxis: lovenox Code Status: Full code Family Communication: none at bedside Disposition Plan:  Consultants:    ID  CT surgery  Dental surgery  OB/Gyn Procedures:   06/16/2018 echo: 1. Tricuspid regurgitation severe by color flow Doppler. There is a large mobile tricuspid valve vegetation on the anterior leaflet. The TV vegetation measures 10 mm x 20 mm. 2. The left ventricle has normal systolic function of 44-96%. The cavity size is normal. There is no left ventricular wall thickness. Echo evidence of normal diastolic filling patterns. 3. The right ventricle is moderately enlarged in size. There is normal  systolic function. Right ventricular systolic pressure is normal with an estimated pressure of 43.8 mmHg. 4. Mildly dilated left atrial size. 5. Moderately dilated right atrial size. 6. The mitral valve is myxomatous. There is mild thickening. Regurgitation is not visualized by color flow Doppler. 7. Large vegetation on the tricuspid valve. 8. Myxomatous tricuspid valve. 9. Tricuspid regurgitation severe.  10. No atrial level shunt detected by color flow Doppler. Antimicrobials:  Anti-infectives (From admission, onward)   Start     Dose/Rate Route Frequency Ordered Stop   06/26/18 0100  vancomycin (VANCOCIN) IVPB 1000 mg/200 mL premix     1,000 mg 200 mL/hr over 60 Minutes Intravenous Every 8 hours 06/25/18 1758 08/04/18 0159   06/23/18 0000  vancomycin (VANCOCIN) 1,250 mg in sodium chloride 0.9 % 250 mL IVPB  Status:  Discontinued     1,250 mg 166.7 mL/hr over 90 Minutes Intravenous Every 8 hours 06/22/18 2332 06/25/18 1758   06/20/18 1000  vancomycin (VANCOCIN) 2,000 mg in sodium chloride 0.9 % 500 mL IVPB  Status:  Discontinued     2,000 mg 250 mL/hr over 120 Minutes Intravenous Every 12 hours 06/19/18 2221 06/22/18 2332   06/18/18 2000  vancomycin (VANCOCIN) IVPB 1000 mg/200 mL premix  Status:  Discontinued     1,000 mg 200 mL/hr over 60 Minutes Intravenous Every 8 hours 06/18/18 1054 06/19/18 2221   06/18/18 1200  vancomycin (VANCOCIN) 1,750 mg in sodium chloride 0.9 % 500 mL IVPB     1,750 mg 250 mL/hr over 120 Minutes Intravenous  Once 06/18/18 1054 06/18/18 2241   06/17/18 2200  cefTRIAXone (ROCEPHIN) 2 g in sodium chloride 0.9 % 100 mL IVPB  2 g 200 mL/hr over 30 Minutes Intravenous Every 12 hours 06/17/18 1046 08/04/18 0959   06/17/18 1200  ampicillin (OMNIPEN) 2 g in sodium chloride 0.9 % 100 mL IVPB     2 g 300 mL/hr over 20 Minutes Intravenous Every 4 hours 06/17/18 1045 08/03/18 2359   06/16/18 1300  oseltamivir (TAMIFLU) capsule 75 mg     75 mg Oral 2 times  daily 06/16/18 1231 06/20/18 0919   06/15/18 0700  vancomycin (VANCOCIN) IVPB 1000 mg/200 mL premix  Status:  Discontinued     1,000 mg 200 mL/hr over 60 Minutes Intravenous Every 8 hours 06/14/18 2216 06/15/18 0711   06/15/18 0500  cefTRIAXone (ROCEPHIN) 2 g in sodium chloride 0.9 % 100 mL IVPB  Status:  Discontinued     2 g 200 mL/hr over 30 Minutes Intravenous Every 24 hours 06/15/18 0445 06/17/18 1046   06/14/18 2200  azithromycin (ZITHROMAX) 500 mg in sodium chloride 0.9 % 250 mL IVPB  Status:  Discontinued     500 mg 250 mL/hr over 60 Minutes Intravenous Every 24 hours 06/14/18 2134 06/15/18 0711   06/14/18 2200  cefTRIAXone (ROCEPHIN) 1 g in sodium chloride 0.9 % 100 mL IVPB  Status:  Discontinued     1 g 200 mL/hr over 30 Minutes Intravenous Every 24 hours 06/14/18 2134 06/15/18 0445   06/14/18 2200  vancomycin (VANCOCIN) 1,500 mg in sodium chloride 0.9 % 500 mL IVPB     1,500 mg 250 mL/hr over 120 Minutes Intravenous  Once 06/14/18 2134 06/15/18 0235       Objective: Vitals:   07/06/18 0756 07/06/18 1735 07/06/18 2300 07/07/18 0744  BP: (!) 146/107 (!) 133/92 (!) 129/94 (!) 135/101  Pulse: 83 82 75 76  Resp:   18   Temp: 98.1 F (36.7 C) 98.1 F (36.7 C) 97.7 F (36.5 C) (!) 97.5 F (36.4 C)  TempSrc:   Oral Oral  SpO2: 98% 97% 96% 100%  Weight:      Height:        Intake/Output Summary (Last 24 hours) at 07/07/2018 0958 Last data filed at 07/06/2018 1803 Gross per 24 hour  Intake 960 ml  Output -  Net 960 ml   Filed Weights   07/02/18 0500 07/03/18 0500 07/05/18 0500  Weight: 84.5 kg 84.5 kg 89.3 kg    Examination: General exam: Appears comfortable  HEENT: PERRLA, oral mucosa moist, no sclera icterus or thrush Respiratory system: Clear to auscultation. Respiratory effort normal. Cardiovascular system: S1 & S2 heard,  No murmurs  Gastrointestinal system: Abdomen soft, non-tender, nondistended. Normal bowel sounds   Central nervous system: Alert and  oriented. No focal neurological deficits. Extremities: No cyanosis, clubbing or edema Skin: No rashes or ulcers Psychiatry:  Mood & affect appropriate.   Data Reviewed: I have personally reviewed following labs and imaging studies  CBC: Recent Labs  Lab 07/02/18 0540  WBC 8.6  HGB 8.2*  HCT 27.8*  MCV 92.1  PLT 161*   Basic Metabolic Panel: Recent Labs  Lab 07/02/18 0540  NA 133*  K 3.9  CL 96*  CO2 26  GLUCOSE 86  BUN 5*  CREATININE 0.52  CALCIUM 8.6*   GFR: Estimated Creatinine Clearance: 114.8 mL/min (by C-G formula based on SCr of 0.52 mg/dL). Liver Function Tests: No results for input(s): AST, ALT, ALKPHOS, BILITOT, PROT, ALBUMIN in the last 168 hours. No results for input(s): LIPASE, AMYLASE in the last 168 hours. No results for input(s): AMMONIA in  the last 168 hours. Coagulation Profile: No results for input(s): INR, PROTIME in the last 168 hours. Cardiac Enzymes: No results for input(s): CKTOTAL, CKMB, CKMBINDEX, TROPONINI in the last 168 hours. BNP (last 3 results) No results for input(s): PROBNP in the last 8760 hours. HbA1C: No results for input(s): HGBA1C in the last 72 hours. CBG: Recent Labs  Lab 07/01/18 0807  GLUCAP 63*   Lipid Profile: No results for input(s): CHOL, HDL, LDLCALC, TRIG, CHOLHDL, LDLDIRECT in the last 72 hours. Thyroid Function Tests: No results for input(s): TSH, T4TOTAL, FREET4, T3FREE, THYROIDAB in the last 72 hours. Anemia Panel: No results for input(s): VITAMINB12, FOLATE, FERRITIN, TIBC, IRON, RETICCTPCT in the last 72 hours. Urine analysis:    Component Value Date/Time   COLORURINE YELLOW 06/26/2018 1130   APPEARANCEUR CLEAR 06/26/2018 1130   APPEARANCEUR Hazy 07/10/2013 1121   LABSPEC 1.006 06/26/2018 1130   LABSPEC 1.020 07/10/2013 1121   PHURINE 7.0 06/26/2018 1130   GLUCOSEU NEGATIVE 06/26/2018 1130   GLUCOSEU Negative 07/10/2013 1121   HGBUR NEGATIVE 06/26/2018 1130   BILIRUBINUR NEGATIVE 06/26/2018 1130     BILIRUBINUR Negative 07/10/2013 1121   KETONESUR NEGATIVE 06/26/2018 1130   PROTEINUR NEGATIVE 06/26/2018 1130   UROBILINOGEN 1.0 02/15/2015 0530   NITRITE NEGATIVE 06/26/2018 1130   LEUKOCYTESUR NEGATIVE 06/26/2018 1130   LEUKOCYTESUR Trace 07/10/2013 1121   Sepsis Labs: @LABRCNTIP (procalcitonin:4,lacticidven:4) ) Recent Results (from the past 240 hour(s))  MRSA PCR Screening     Status: None   Collection Time: 07/03/18 11:03 PM  Result Value Ref Range Status   MRSA by PCR NEGATIVE NEGATIVE Final    Comment:        The GeneXpert MRSA Assay (FDA approved for NASAL specimens only), is one component of a comprehensive MRSA colonization surveillance program. It is not intended to diagnose MRSA infection nor to guide or monitor treatment for MRSA infections. Performed at [facility], [place] [address] [place], [place] [address]          Radiology Studies: No results found.    Scheduled Meds: . acetaminophen  1,000 mg Oral QID  . enoxaparin (LOVENOX) injection  40 mg Subcutaneous Q24H  . feeding supplement (ENSURE ENLIVE)  237 mL Oral BID BM  . ibuprofen  400 mg Oral QID  . methadone  40 mg Oral q1800  . methadone  60 mg Oral Daily  . prenatal multivitamin  1 tablet Oral Q1200  . sodium chloride flush  10-40 mL Intracatheter Q12H  . sodium chloride flush  3 mL Intravenous Q12H   Continuous Infusions: . ampicillin (OMNIPEN) IV 2 g (07/07/18 0740)  . cefTRIAXone (ROCEPHIN)  IV Stopped (07/07/18 0701)  . lactated ringers 10 mL/hr at 07/03/18 2101  . vancomycin 1,000 mg (07/07/18 0201)     LOS: 22 days      Debbe Odea, MD [facility] Pager: [email or web address] Password [name] 07/07/2018, 9:58 AM

## 2018-07-07 NOTE — Progress Notes (Signed)
Spoke with Dr. Roselie Awkward. FHR 143 BPM by doppler. Pt denies vaginal bleeding or leaking of fluid. Pt does have some elevated blood pressures, 2/24 at 0744, 135/101, 2/23, 129/94 and 133/92.

## 2018-07-08 LAB — CREATININE, SERUM
Creatinine, Ser: 0.46 mg/dL (ref 0.44–1.00)
GFR calc Af Amer: >60 mL/min (ref >60)
GFR calc non Af Amer: >60 mL/min (ref >60)

## 2018-07-08 LAB — VANCOMYCIN, PEAK: Vancomycin Pk: 29 ug/mL — ABNORMAL LOW (ref 30–40)

## 2018-07-08 LAB — VANCOMYCIN, TROUGH: Vancomycin Tr: 13 ug/mL — ABNORMAL LOW (ref 15–20)

## 2018-07-08 NOTE — Progress Notes (Addendum)
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 Patient is  Pregnant with an approx due date 11/01/2018.    Subjective:  No complaints.   Assessment & Plan:   Principal Problem:   Bacteremia due to Enterococcus and Streptococcus- bacterial endocarditis Tricuspid valve, Multifocal pneumonia and Influenza B - -Continue IV ceftriaxone, vancomycin, ampicillin - she needs IV antibiotics through 08/03/18 - Cardiothoracic surgery consulted, currently not a surgical candidate - has had teeth removed on 2/20   Active Problems:   High-risk pregnancy  - OB RN monitoring fetal heart rate      Opioid use disorder, severe, dependence  - continue Methadone 60 mg in AM and 40 mg in PM  Time spent in minutes: stable DVT prophylaxis: lovenox Code Status: Full code Family Communication: none at bedside Disposition Plan: will need to be in hospital for IV antibiotics Consultants:    ID  CT surgery  Dental surgery  OB/Gyn Procedures:   06/16/2018 echo: 1. Tricuspid regurgitation severe by color flow Doppler. There is a large mobile tricuspid valve vegetation on the anterior leaflet. The TV vegetation measures 10 mm x 20 mm. 2. The left ventricle has normal systolic function of 24-46%. The cavity size is normal. There is no left ventricular wall thickness. Echo evidence of normal diastolic filling patterns. 3. The right ventricle is moderately enlarged in size. There is normal  systolic function. Right ventricular systolic pressure is normal with an estimated pressure of 43.8 mmHg. 4. Mildly dilated left atrial size. 5. Moderately dilated right atrial size. 6. The mitral valve is myxomatous. There is mild thickening. Regurgitation is not visualized by color flow Doppler. 7. Large vegetation on the tricuspid valve. 8. Myxomatous tricuspid valve. 9. Tricuspid regurgitation severe.  10. No atrial level shunt detected by color flow Doppler. Antimicrobials:  Anti-infectives (From admission, onward)   Start     Dose/Rate Route Frequency Ordered Stop   06/26/18 0100  vancomycin (VANCOCIN) IVPB 1000 mg/200 mL premix     1,000 mg 200 mL/hr over 60 Minutes Intravenous Every 8 hours 06/25/18 1758 08/04/18 0159   06/23/18 0000  vancomycin (VANCOCIN) 1,250 mg in sodium chloride 0.9 % 250 mL IVPB  Status:  Discontinued     1,250 mg 166.7 mL/hr over 90 Minutes Intravenous Every 8 hours 06/22/18 2332 06/25/18 1758   06/20/18 1000  vancomycin (VANCOCIN) 2,000 mg in sodium chloride 0.9 % 500 mL IVPB  Status:  Discontinued     2,000 mg 250 mL/hr over 120 Minutes Intravenous Every 12 hours 06/19/18 2221 06/22/18 2332   06/18/18 2000  vancomycin (VANCOCIN) IVPB 1000 mg/200 mL premix  Status:  Discontinued     1,000 mg 200 mL/hr over 60 Minutes Intravenous Every 8 hours 06/18/18 1054 06/19/18 2221   06/18/18 1200  vancomycin (VANCOCIN) 1,750 mg in sodium chloride 0.9 % 500 mL IVPB     1,750 mg 250 mL/hr over 120 Minutes Intravenous  Once 06/18/18 1054 06/18/18 2241   06/17/18 2200  cefTRIAXone (ROCEPHIN) 2 g in sodium chloride 0.9 % 100  mL IVPB     2 g 200 mL/hr over 30 Minutes Intravenous Every 12 hours 06/17/18 1046 08/04/18 0959   06/17/18 1200  ampicillin (OMNIPEN) 2 g in sodium chloride 0.9 % 100 mL IVPB     2 g 300 mL/hr over 20 Minutes Intravenous Every 4 hours 06/17/18 1045 08/03/18 2359   06/16/18 1300  oseltamivir (TAMIFLU) capsule 75 mg     75 mg Oral 2 times  daily 06/16/18 1231 06/20/18 0919   06/15/18 0700  vancomycin (VANCOCIN) IVPB 1000 mg/200 mL premix  Status:  Discontinued     1,000 mg 200 mL/hr over 60 Minutes Intravenous Every 8 hours 06/14/18 2216 06/15/18 0711   06/15/18 0500  cefTRIAXone (ROCEPHIN) 2 g in sodium chloride 0.9 % 100 mL IVPB  Status:  Discontinued     2 g 200 mL/hr over 30 Minutes Intravenous Every 24 hours 06/15/18 0445 06/17/18 1046   06/14/18 2200  azithromycin (ZITHROMAX) 500 mg in sodium chloride 0.9 % 250 mL IVPB  Status:  Discontinued     500 mg 250 mL/hr over 60 Minutes Intravenous Every 24 hours 06/14/18 2134 06/15/18 0711   06/14/18 2200  cefTRIAXone (ROCEPHIN) 1 g in sodium chloride 0.9 % 100 mL IVPB  Status:  Discontinued     1 g 200 mL/hr over 30 Minutes Intravenous Every 24 hours 06/14/18 2134 06/15/18 0445   06/14/18 2200  vancomycin (VANCOCIN) 1,500 mg in sodium chloride 0.9 % 500 mL IVPB     1,500 mg 250 mL/hr over 120 Minutes Intravenous  Once 06/14/18 2134 06/15/18 0235       Objective: Vitals:   07/07/18 1727 07/07/18 2300 07/08/18 0425 07/08/18 0730  BP: (!) 128/94 (!) 130/92  (!) 138/95  Pulse: 77 77  75  Resp:      Temp: 97.8 F (36.6 C) 97.6 F (36.4 C)  97.7 F (36.5 C)  TempSrc:  Oral  Oral  SpO2: 97% 98%  97%  Weight:   90 kg   Height:        Intake/Output Summary (Last 24 hours) at 07/08/2018 1205 Last data filed at 07/08/2018 0700 Gross per 24 hour  Intake 2398.5 ml  Output -  Net 2398.5 ml   Filed Weights   07/03/18 0500 07/05/18 0500 07/08/18 0425  Weight: 84.5 kg 89.3 kg 90 kg    Examination: General exam: Appears comfortable  HEENT: PERRLA, oral mucosa moist, no sclera icterus or thrush Respiratory system: Clear to auscultation. Respiratory effort normal. Cardiovascular system: S1 & S2 heard,  No murmurs  Gastrointestinal system: Abdomen soft, non-tender, nondistended. Normal bowel sounds   Central nervous system: Alert and oriented. No focal neurological  deficits. Extremities: No cyanosis, clubbing or edema Skin: No rashes or ulcers Psychiatry:  Mood & affect appropriate.   Data Reviewed: I have personally reviewed following labs and imaging studies  CBC: Recent Labs  Lab 07/02/18 0540  WBC 8.6  HGB 8.2*  HCT 27.8*  MCV 92.1  PLT 027*   Basic Metabolic Panel: Recent Labs  Lab 07/02/18 0540 07/08/18 0345  NA 133*  --   K 3.9  --   CL 96*  --   CO2 26  --   GLUCOSE 86  --   BUN 5*  --   CREATININE 0.52 0.46  CALCIUM 8.6*  --    GFR: Estimated Creatinine Clearance: 115.3 mL/min (by C-G formula based on SCr of 0.46 mg/dL). Liver Function Tests: No results for input(s): AST, ALT,  ALKPHOS, BILITOT, PROT, ALBUMIN in the last 168 hours. No results for input(s): LIPASE, AMYLASE in the last 168 hours. No results for input(s): AMMONIA in the last 168 hours. Coagulation Profile: No results for input(s): INR, PROTIME in the last 168 hours. Cardiac Enzymes: No results for input(s): CKTOTAL, CKMB, CKMBINDEX, TROPONINI in the last 168 hours. BNP (last 3 results) No results for input(s): PROBNP in the last 8760 hours. HbA1C: No results for input(s): HGBA1C in the last 72 hours. CBG: No results for input(s): GLUCAP in the last 168 hours. Lipid Profile: No results for input(s): CHOL, HDL, LDLCALC, TRIG, CHOLHDL, LDLDIRECT in the last 72 hours. Thyroid Function Tests: No results for input(s): TSH, T4TOTAL, FREET4, T3FREE, THYROIDAB in the last 72 hours. Anemia Panel: No results for input(s): VITAMINB12, FOLATE, FERRITIN, TIBC, IRON, RETICCTPCT in the last 72 hours. Urine analysis:    Component Value Date/Time   COLORURINE YELLOW 06/26/2018 1130   APPEARANCEUR CLEAR 06/26/2018 1130   APPEARANCEUR Hazy 07/10/2013 1121   LABSPEC 1.006 06/26/2018 1130   LABSPEC 1.020 07/10/2013 1121   PHURINE 7.0 06/26/2018 1130   GLUCOSEU NEGATIVE 06/26/2018 1130   GLUCOSEU Negative 07/10/2013 1121   HGBUR NEGATIVE 06/26/2018 1130    BILIRUBINUR NEGATIVE 06/26/2018 1130   BILIRUBINUR Negative 07/10/2013 1121   KETONESUR NEGATIVE 06/26/2018 1130   PROTEINUR NEGATIVE 06/26/2018 1130   UROBILINOGEN 1.0 02/15/2015 0530   NITRITE NEGATIVE 06/26/2018 1130   LEUKOCYTESUR NEGATIVE 06/26/2018 1130   LEUKOCYTESUR Trace 07/10/2013 1121   Sepsis Labs: @LABRCNTIP (procalcitonin:4,lacticidven:4) ) Recent Results (from the past 240 hour(s))  MRSA PCR Screening     Status: None   Collection Time: 07/03/18 11:03 PM  Result Value Ref Range Status   MRSA by PCR NEGATIVE NEGATIVE Final    Comment:        The GeneXpert MRSA Assay (FDA approved for NASAL specimens only), is one component of a comprehensive MRSA colonization surveillance program. It is not intended to diagnose MRSA infection nor to guide or monitor treatment for MRSA infections. Performed at [facility], [place] [address], [place], [place] [address]          Radiology Studies: No results found.    Scheduled Meds: . acetaminophen  1,000 mg Oral QID  . enoxaparin (LOVENOX) injection  40 mg Subcutaneous Q24H  . feeding supplement (ENSURE ENLIVE)  237 mL Oral BID BM  . ibuprofen  400 mg Oral QID  . methadone  40 mg Oral q1800  . methadone  60 mg Oral Daily  . prenatal multivitamin  1 tablet Oral Q1200  . sodium chloride flush  10-40 mL Intracatheter Q12H  . sodium chloride flush  3 mL Intravenous Q12H   Continuous Infusions: . ampicillin (OMNIPEN) IV 2 g (07/08/18 0826)  . cefTRIAXone (ROCEPHIN)  IV 2 g (07/08/18 0937)  . lactated ringers 10 mL/hr at 07/03/18 2101  . vancomycin 1,000 mg (07/08/18 1022)     LOS: 23 days      Debbe Odea, MD [facility] Pager: [email or web address] Password [facility] 07/08/2018, 12:05 PM

## 2018-07-08 NOTE — Progress Notes (Signed)
Pharmacy Antibiotic Note  Kelly [name] is a 33 y.o. female admitted on 06/14/2018 with strep and enterococcal endocarditis.  Patient continues on  IV ceftriaxone, vancomycin, ampicillin for Bacteremia due to Enterococcus and Streptococcus- bacterial endocarditis tricuspid valve, multifocal pneumonia.   Vancomycin needed as Strep sanguinis species resulted as resistant to ceftriaxone and penicillin.  These three IV antibiotics to continue x 6 weeks per ID recommendation, through 08/03/18.  Patient to stay inpatient for duration of IV antibiotic therapy.    Today 2/25 steady state vancomycin peak and trough were checked.  Vancomycin AUC is therapeutic at 531.8 on vancomycin 1000 mg q8h.  Goal AUC 400-550.  Afebrile, WBC wnl on 2/19, and renal function remains stable.  Plan: Continue vancomycin 1000 mg IV q8h Continue ampicillin and Rocephin per MD Monitor renal fxn, repeat vanomycin levels, AUC as appropriate.    Height: 5' 7"  (170.2 cm) Weight: 198 lb 6.6 oz (90 kg) IBW/kg (Calculated) : 61.6  Temp (24hrs), Avg:97.7 F (36.5 C), Min:97.6 F (36.4 C), Max:97.8 F (36.6 C)  Recent Labs  Lab 07/02/18 0540 07/08/18 0345 07/08/18 0932  WBC 8.6  --   --   CREATININE 0.52 0.46  --   VANCOTROUGH  --   --  13*  VANCOPEAK  --  29*  --     Estimated Creatinine Clearance: 115.3 mL/min (by C-G formula based on SCr of 0.46 mg/dL).    Allergies  Allergen Reactions  . Hydrocodone Itching  . Morphine And Related Nausea And Vomiting    Azithromycin 2/1>>2/2 Vancomcyin 2/1>>2/2, 2/5>>(3/22) Ceftriaxone 2/1>>(3/22) Ampicillin 2/4>>(3/22) Tamiflu 2/3>>2/7  2/6: Levels 19/11 (5 hours apart) -  on 1gm q8hh - AUC low at 389 on this dose >> dose adjusted to 2g IV q12h for estimated AUC 517 2/9: Levels 39/9 (12 hrs apart) - on Vanc 2gm IV q12h - >AUC supratherapeutic at 597 (goal 400-550) > adjusted to 1250 mg IV q8h for estimated AUC 556 - Calculated Ke 0.2185, half-life 3.2 hrs 2/12:  VP/VT 30/14, AUC 577 on 1253m q8 >> 1g q8 2/17: VP/VT 19/13, AUC 505.6 on [id number], ke 0.1198, t1/2 5.8h - cont 1000 mg q8h 2/25: VP/VT 29/13, AUC 531.8 on 1000 mg q8h, ke 0.138, t1/2 5.0h-continue 1000 mg q8h   2/1 Blood x 2: Enterococcus faecalis (pan-S), Strep sanguinis 1/2 (R-PCN and CTX, S-Vanc) and strep mitis/oralis (R-tetra, I-PCN, S-Vanc/Clinda/CTX) 2/1 Urine: E coli - sensitive to cefazolin, ceftriaxone; R - amp, cipro, septra, unasyn, zosyn 2/3 Influenza PCR - positive B 2/3 Blood x 2: negative 2/7 RPR negative 2/7 HIV non-reactive 2/7 Rubella < 0.90 = non-immune 2/13 urine - negative 2/20 MRSA PCR: negative  Thank you for allowing pharmacy to be part of this patients care team.  RuNicole CellaRPWinstonharmacist [phone] check AMION for all [facility] numbers After 10:00 PM, call [phone]/25/2020 1:27 PM

## 2018-07-09 DIAGNOSIS — K0401 Reversible pulpitis: Secondary | ICD-10-CM

## 2018-07-09 MED ORDER — DOCUSATE SODIUM 100 MG PO CAPS
100.0000 mg | ORAL_CAPSULE | Freq: Every day | ORAL | Status: DC | PRN
  Administered 2018-07-09 – 2018-08-01 (×17): 100 mg via ORAL
  Filled 2018-07-09 (×18): qty 1

## 2018-07-09 NOTE — Progress Notes (Signed)
PROGRESS NOTE    Kelly [name]  KXF:[id number] DOB: 05/28/1985 DOA: 06/14/2018 PCP: [facility]    Brief Narrative:  33 year old female who presents with low back pain, cough and malaise. 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 Patient reported 7 days of worsening back pain, associated with particular cough, occasional dyspnea and generalized malaise. Apparently she has been using IV drugs. She has bee pain OB for a current pregnancy. On her initial physical examination she was tachypneic 30 because per minute, tachycardic 120 bpm, afebrile, oxygen saturation 90%, she had moist mucous membranes, lungs clear to auscultation bilaterally, heart S1-S2 present rhythmic, abdomen gravid uterus, positive bilateral pitting lower extremity edema. Her chest x-ray showed bilateral multilobar opacities.  She was admitted to the hospital with working diagnosis of multilobar bilateral pneumonia.  Assessment & Plan:   Principal Problem:   Bacteremia due to Enterococcus Active Problems:   Opioid use disorder, severe, dependence (HCC)   Sepsis due to pneumonia ([place])   IV drug abuse complicating pregnancy ([place])   Multifocal pneumonia   Hyponatremia   Elevated transaminase level   Degenerative arthritis of lumbar spine   History of bacterial endocarditis   Severe tricuspid regurgitation by prior echocardiogram   Influenza B   Rubella non-immune status, antepartum   High-risk pregnancy in second trimester   Preexisting hypertension complicating pregnancy, antepartum   Dental caries   Phobia of dental procedure   1. Tricuspid valve endocarditis, with enterococcus and streptococcus bacteremia, complicated with septic emboli to the lungs, multifocal pneumonia. Will continue antibiotic therapy with vancomycin, ampicillin and ceftriaxone per ID recommendations. Had  teeth removed on this admission.   2, Opioid use disorder. Continue methadone with no major complications, her pain seems to be controlled today.   3. Pregnancy. Will inform OB about patient's hospitalization.   DVT prophylaxis: enoxaparin   Code Status: full Family Communication: no family at the bedside  Disposition Plan/ discharge barriers: pending completion of antibiotic therapy.   Body mass index is 31.08 kg/m. Malnutrition Type:      Malnutrition Characteristics:      Nutrition Interventions:     RN Pressure Injury Documentation:     Consultants:     Procedures:     Antimicrobials:   Vancomycin, ceftriaxone and ampicillin.     Subjective: Patient denies any chest pain or dyspnea, back pain is controlled, had intermittent nausea but no vomiting.   Objective: Vitals:   07/08/18 0425 07/08/18 0730 07/09/18 0031 07/09/18 0809  BP:  (!) 138/95 (!) 140/95 (!) 143/103  Pulse:  75 70 80  Resp:      Temp:  97.7 F (36.5 C) 98 F (36.7 C) 97.8 F (36.6 C)  TempSrc:  Oral Oral Oral  SpO2:  97% 97% 97%  Weight: 90 kg     Height:        Intake/Output Summary (Last 24 hours) at 07/09/2018 1342 Last data filed at 07/09/2018 0145 Gross per 24 hour  Intake 1729.73 ml  Output -  Net 1729.73 ml   Filed Weights   07/03/18 0500 07/05/18 0500 07/08/18 0425  Weight: 84.5 kg 89.3 kg 90 kg    Examination:   General: deconditioned.  Neurology: Awake and alert, non focal  E ENT: mild pallor, no icterus, oral mucosa moist Cardiovascular: No JVD. S1-S2 present, rhythmic, no gallops, rubs, or murmurs. No lower extremity edema. Pulmonary: positive  breath sounds bilaterally, adequate air movement, no wheezing, rhonchi or rales. Gastrointestinal. Abdomen with no organomegaly, non tender, no rebound or guarding Skin. No rashes Musculoskeletal: no joint deformities     Data Reviewed: I have personally reviewed following labs and imaging studies  CBC: No  results for input(s): WBC, NEUTROABS, HGB, HCT, MCV, PLT in the last 168 hours. Basic Metabolic Panel: Recent Labs  Lab 07/08/18 0345  CREATININE 0.46   GFR: Estimated Creatinine Clearance: 115.3 mL/min (by C-G formula based on SCr of 0.46 mg/dL). Liver Function Tests: No results for input(s): AST, ALT, ALKPHOS, BILITOT, PROT, ALBUMIN in the last 168 hours. No results for input(s): LIPASE, AMYLASE in the last 168 hours. No results for input(s): AMMONIA in the last 168 hours. Coagulation Profile: No results for input(s): INR, PROTIME in the last 168 hours. Cardiac Enzymes: No results for input(s): CKTOTAL, CKMB, CKMBINDEX, TROPONINI in the last 168 hours. BNP (last 3 results) No results for input(s): PROBNP in the last 8760 hours. HbA1C: No results for input(s): HGBA1C in the last 72 hours. CBG: No results for input(s): GLUCAP in the last 168 hours. Lipid Profile: No results for input(s): CHOL, HDL, LDLCALC, TRIG, CHOLHDL, LDLDIRECT in the last 72 hours. Thyroid Function Tests: No results for input(s): TSH, T4TOTAL, FREET4, T3FREE, THYROIDAB in the last 72 hours. Anemia Panel: No results for input(s): VITAMINB12, FOLATE, FERRITIN, TIBC, IRON, RETICCTPCT in the last 72 hours.    Radiology Studies: I have reviewed all of the imaging during this hospital visit personally     Scheduled Meds: . acetaminophen  1,000 mg Oral QID  . enoxaparin (LOVENOX) injection  40 mg Subcutaneous Q24H  . feeding supplement (ENSURE ENLIVE)  237 mL Oral BID BM  . ibuprofen  400 mg Oral QID  . methadone  40 mg Oral q1800  . methadone  60 mg Oral Daily  . prenatal multivitamin  1 tablet Oral Q1200  . sodium chloride flush  10-40 mL Intracatheter Q12H  . sodium chloride flush  3 mL Intravenous Q12H   Continuous Infusions: . ampicillin (OMNIPEN) IV 2 g (07/09/18 1227)  . cefTRIAXone (ROCEPHIN)  IV 2 g (07/09/18 1047)  . lactated ringers 10 mL/hr at 07/03/18 2101  . vancomycin 1,000 mg  (07/09/18 1034)     LOS: 24 days        Kelly [name] Gerome Apley, MD

## 2018-07-10 DIAGNOSIS — O10919 Unspecified pre-existing hypertension complicating pregnancy, unspecified trimester: Secondary | ICD-10-CM

## 2018-07-10 DIAGNOSIS — K045 Chronic apical periodontitis: Secondary | ICD-10-CM

## 2018-07-10 LAB — BASIC METABOLIC PANEL
ANION GAP: 9 (ref 5–15)
Anion gap: 6 (ref 5–15)
BUN: 8 mg/dL (ref 6–20)
BUN: 8 mg/dL (ref 6–20)
CO2: 22 mmol/L (ref 22–32)
CO2: 26 mmol/L (ref 22–32)
Calcium: 7 mg/dL — ABNORMAL LOW (ref 8.9–10.3)
Calcium: 8 mg/dL — ABNORMAL LOW (ref 8.9–10.3)
Chloride: 91 mmol/L — ABNORMAL LOW (ref 98–111)
Chloride: 103 mmol/L (ref 98–111)
Creatinine, Ser: 0.6 mg/dL (ref 0.44–1.00)
Creatinine, Ser: 0.44 mg/dL (ref 0.44–1.00)
GFR calc Af Amer: >60 mL/min (ref >60)
GFR calc non Af Amer: >60 mL/min (ref >60)
GFR calc non Af Amer: >60 mL/min (ref >60)
Glucose, Bld: 549 mg/dL (ref 70–99)
Glucose, Bld: 76 mg/dL (ref 70–99)
Potassium: 3.2 mmol/L — ABNORMAL LOW (ref 3.5–5.1)
Potassium: 3.9 mmol/L (ref 3.5–5.1)
Sodium: 122 mmol/L — ABNORMAL LOW (ref 135–145)
Sodium: 135 mmol/L (ref 135–145)

## 2018-07-10 LAB — CBC WITH DIFFERENTIAL/PLATELET
ABS IMMATURE GRANULOCYTES: 0.08 10*3/uL — AB (ref 0.00–0.07)
Abs Immature Granulocytes: 0.05 10*3/uL (ref 0.00–0.07)
BASOS ABS: 0 10*3/uL (ref 0.0–0.1)
BASOS PCT: 1 %
Basophils Absolute: 0 10*3/uL (ref 0.0–0.1)
Basophils Relative: 1 %
EOS PCT: 2 %
Eosinophils Absolute: 0.1 10*3/uL (ref 0.0–0.5)
Eosinophils Absolute: 0.1 10*3/uL (ref 0.0–0.5)
Eosinophils Relative: 2 %
HCT: 24.7 % — ABNORMAL LOW (ref 36.0–46.0)
HCT: 24.9 % — ABNORMAL LOW (ref 36.0–46.0)
Hemoglobin: 7.3 g/dL — ABNORMAL LOW (ref 12.0–15.0)
Hemoglobin: 7.6 g/dL — ABNORMAL LOW (ref 12.0–15.0)
Immature Granulocytes: 1 %
Immature Granulocytes: 1 %
LYMPHS PCT: 23 %
Lymphocytes Relative: 19 %
Lymphs Abs: 1.5 10*3/uL (ref 0.7–4.0)
Lymphs Abs: 1.3 10*3/uL (ref 0.7–4.0)
MCH: 27.4 pg (ref 26.0–34.0)
MCH: 28.1 pg (ref 26.0–34.0)
MCHC: 29.6 g/dL — ABNORMAL LOW (ref 30.0–36.0)
MCHC: 30.5 g/dL (ref 30.0–36.0)
MCV: 92.9 fL (ref 80.0–100.0)
MCV: 92.2 fL (ref 80.0–100.0)
MONOS PCT: 6 %
Monocytes Absolute: 0.4 10*3/uL (ref 0.1–1.0)
Monocytes Absolute: 0.4 10*3/uL (ref 0.1–1.0)
Monocytes Relative: 6 %
NEUTROS PCT: 71 %
Neutro Abs: 4.5 10*3/uL (ref 1.7–7.7)
Neutro Abs: 4.9 10*3/uL (ref 1.7–7.7)
Neutrophils Relative %: 67 %
Platelets: 461 10*3/uL — ABNORMAL HIGH (ref 150–400)
Platelets: 458 10*3/uL — ABNORMAL HIGH (ref 150–400)
RBC: 2.66 MIL/uL — AB (ref 3.87–5.11)
RBC: 2.7 MIL/uL — ABNORMAL LOW (ref 3.87–5.11)
RDW: 17.2 % — ABNORMAL HIGH (ref 11.5–15.5)
RDW: 17.4 % — ABNORMAL HIGH (ref 11.5–15.5)
WBC: 6.6 10*3/uL (ref 4.0–10.5)
WBC: 6.9 10*3/uL (ref 4.0–10.5)
nRBC: 0 % (ref 0.0–0.2)
nRBC: 0 % (ref 0.0–0.2)

## 2018-07-10 MED ORDER — IBUPROFEN 400 MG PO TABS: 400.0000 mg | ORAL_TABLET | Freq: Four times a day (QID) | ORAL | Status: DC | PRN

## 2018-07-10 MED ORDER — PANTOPRAZOLE SODIUM 40 MG PO TBEC
40.0000 mg | DELAYED_RELEASE_TABLET | Freq: Two times a day (BID) | ORAL | Status: DC
  Administered 2018-07-10 – 2018-08-04 (×51): 40 mg via ORAL
  Filled 2018-07-10 (×51): qty 1

## 2018-07-10 MED ORDER — SUCRALFATE 1 GM/10ML PO SUSP
1.0000 g | Freq: Three times a day (TID) | ORAL | Status: DC
  Administered 2018-07-10 – 2018-07-11 (×6): 1 g via ORAL
  Filled 2018-07-10 (×8): qty 10

## 2018-07-10 MED ORDER — CALCIUM CARBONATE ANTACID 500 MG PO CHEW
1.0000 | CHEWABLE_TABLET | Freq: Two times a day (BID) | ORAL | Status: DC | PRN
  Filled 2018-07-10: qty 1

## 2018-07-10 NOTE — Progress Notes (Signed)
PROGRESS NOTE    Kelly [name]  ZGY:[id number] DOB: 1985/08/04 DOA: 06/14/2018 PCP: [facility]    Brief Narrative:  33 year old female who presents with low back pain, cough and malaise. 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 Patient reported 7 days of worsening back pain, associated with particular cough, occasional dyspnea and generalized malaise. Apparently she has been using IV drugs. She has bee pain OB for a current pregnancy. On her initial physical examination she was tachypneic 30 because per minute, tachycardic 120 bpm, afebrile, oxygen saturation 90%, she had moist mucous membranes, lungs clear to auscultation bilaterally, heart S1-S2 present rhythmic, abdomen gravid uterus, positive bilateral pitting lower extremity edema. Her chest x-ray showed bilateral multilobar opacities.  She was admitted to the hospital with working diagnosis of multilobar bilateral pneumonia.    Assessment & Plan:   Principal Problem:   Bacteremia due to Enterococcus Active Problems:   Opioid use disorder, severe, dependence (HCC)   Sepsis due to pneumonia ([place])   IV drug abuse complicating pregnancy ([place])   Multifocal pneumonia   Hyponatremia   Elevated transaminase level   Degenerative arthritis of lumbar spine   History of bacterial endocarditis   Severe tricuspid regurgitation by prior echocardiogram   Influenza B   Rubella non-immune status, antepartum   High-risk pregnancy in second trimester   Preexisting hypertension complicating pregnancy, antepartum   Dental caries   Phobia of dental procedure   1. Tricuspid valve endocarditis, with enterococcus and streptococcus bacteremia, complicated with septic emboli to the lungs, multifocal pneumonia. Antibiotic therapy with IV vancomycin, ampicillin and ceftriaxone  Teeth have been removed.   2,  Opioid use disorder. Tolerating well pain control with methadone, no withdrawal symptoms, will continue neuro checks per unit protocol and will change ibuprofen and acetaminophen to as needed.  3. Pregnancy. Follow with OB recommendations.   4. GERD. Will place patient on pantoprazole and sucralfate, tolerating po well, nausea controlled with as needed zofran.   DVT prophylaxis: enoxaparin   Code Status: full Family Communication: no family at the bedside  Disposition Plan/ discharge barriers: pending completion of antibiotic therapy.    Body mass index is 31.08 kg/m. Malnutrition Type:      Malnutrition Characteristics:      Nutrition Interventions:     RN Pressure Injury Documentation:     Consultants:     Procedures:     Antimicrobials:       Subjective: Patient reports that her back pain is controlled, no nausea or vomiting, but positive reflux and burning type epigastric abdominal pain.   Objective: Vitals:   07/09/18 0809 07/09/18 1707 07/09/18 2330 07/10/18 0751  BP: (!) 143/103 123/90 (!) 132/91 (!) 142/97  Pulse: 80 78 82 83  Resp:   17   Temp: 97.8 F (36.6 C) 97.7 F (36.5 C) (!) 97.5 F (36.4 C) (!) 97.5 F (36.4 C)  TempSrc: Oral Oral Oral   SpO2: 97% 97% 97% 98%  Weight:      Height:       No intake or output data in the 24 hours ending 07/10/18 1223 Filed Weights   07/03/18 0500 07/05/18 0500 07/08/18 0425  Weight: 84.5 kg 89.3 kg 90 kg    Examination:   General: deconditioned.  Neurology: Awake and alert, non focal  E ENT: mild pallor, no icterus, oral mucosa moist Cardiovascular: No JVD. S1-S2 present, rhythmic,  no gallops, rubs, or murmurs. No lower extremity edema. Pulmonary: positive breath sounds bilaterally, adequate air movement, no wheezing, rhonchi or rales. Gastrointestinal. Abdomen with no organomegaly, non tender, no rebound or guarding Skin. No rashes Musculoskeletal: no joint deformities     Data  Reviewed: I have personally reviewed following labs and imaging studies  CBC: Recent Labs  Lab 07/10/18 0650 07/10/18 0835  WBC 6.6 6.9  NEUTROABS 4.5 4.9  HGB 7.3* 7.6*  HCT 24.7* 24.9*  MCV 92.9 92.2  PLT 461* 757*   Basic Metabolic Panel: Recent Labs  Lab 07/08/18 0345 07/10/18 0650 07/10/18 0835  NA  --  122* 135  K  --  3.2* 3.9  CL  --  91* 103  CO2  --  22 26  GLUCOSE  --  549* 76  BUN  --  8 8  CREATININE 0.46 0.60 0.44  CALCIUM  --  7.0* 8.0*   GFR: Estimated Creatinine Clearance: 115.3 mL/min (by C-G formula based on SCr of 0.44 mg/dL). Liver Function Tests: No results for input(s): AST, ALT, ALKPHOS, BILITOT, PROT, ALBUMIN in the last 168 hours. No results for input(s): LIPASE, AMYLASE in the last 168 hours. No results for input(s): AMMONIA in the last 168 hours. Coagulation Profile: No results for input(s): INR, PROTIME in the last 168 hours. Cardiac Enzymes: No results for input(s): CKTOTAL, CKMB, CKMBINDEX, TROPONINI in the last 168 hours. BNP (last 3 results) No results for input(s): PROBNP in the last 8760 hours. HbA1C: No results for input(s): HGBA1C in the last 72 hours. CBG: No results for input(s): GLUCAP in the last 168 hours. Lipid Profile: No results for input(s): CHOL, HDL, LDLCALC, TRIG, CHOLHDL, LDLDIRECT in the last 72 hours. Thyroid Function Tests: No results for input(s): TSH, T4TOTAL, FREET4, T3FREE, THYROIDAB in the last 72 hours. Anemia Panel: No results for input(s): VITAMINB12, FOLATE, FERRITIN, TIBC, IRON, RETICCTPCT in the last 72 hours.    Radiology Studies: I have reviewed all of the imaging during this hospital visit personally     Scheduled Meds: . enoxaparin (LOVENOX) injection  40 mg Subcutaneous Q24H  . feeding supplement (ENSURE ENLIVE)  237 mL Oral BID BM  . methadone  40 mg Oral q1800  . methadone  60 mg Oral Daily  . pantoprazole  40 mg Oral BID  . prenatal multivitamin  1 tablet Oral Q1200  . sodium  chloride flush  10-40 mL Intracatheter Q12H  . sodium chloride flush  3 mL Intravenous Q12H  . sucralfate  1 g Oral TID WC & HS   Continuous Infusions: . ampicillin (OMNIPEN) IV 2 g (07/10/18 0759)  . cefTRIAXone (ROCEPHIN)  IV 2 g (07/10/18 1022)  . lactated ringers 10 mL/hr at 07/03/18 2101  . vancomycin 1,000 mg (07/10/18 1023)     LOS: 25 days         Gerome Apley, MD

## 2018-07-11 NOTE — Progress Notes (Signed)
PROGRESS NOTE    Kelly [name]  JIR:[id number] DOB: 10-11-1985 DOA: 06/14/2018 PCP: [facility]    Brief Narrative:  33 year old female who presents with low back pain, cough and malaise. 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 Patient reported 7 days of worsening back pain, associated with particular cough, occasional dyspnea and generalized malaise. Apparently she has been using IV drugs. She has bee pain OB for a current pregnancy. On her initial physical examination she was tachypneic 30 because per minute, tachycardic 120 bpm, afebrile, oxygen saturation 90%, she had moist mucous membranes, lungs clear to auscultation bilaterally, heart S1-S2 present rhythmic, abdomen gravid uterus, positive bilateral pitting lower extremity edema. Her chest x-ray showed bilateral multilobar opacities.  She was admitted to the hospital with working diagnosis of multilobar bilateral pneumonia.   Assessment & Plan:   Principal Problem:   Bacteremia due to Enterococcus Active Problems:   Opioid use disorder, severe, dependence (HCC)   Sepsis due to pneumonia ([place])   IV drug abuse complicating pregnancy ([place])   Multifocal pneumonia   Hyponatremia   Elevated transaminase level   Degenerative arthritis of lumbar spine   History of bacterial endocarditis   Severe tricuspid regurgitation by prior echocardiogram   Influenza B   Rubella non-immune status, antepartum   High-risk pregnancy in second trimester   Preexisting hypertension complicating pregnancy, antepartum   Dental caries   Phobia of dental procedure  1. Tricuspid valve endocarditis, with enterococcus and streptococcus bacteremia, complicated with septic emboli to the lungs, multifocal pneumonia. (plan to complete therapy 08/03/2018.) Tolerating well antibiotic therapy with IV vancomycin, ampicillin  and ceftriaxone.    2, Opioid use disorder. Continue with methadone, no active withdrawal symptoms. As needed acetaminophen and ibuprofen.  3. Pregnancy.  OB has been following the patient.    4. GERD. Symptoms improved with pantoprazole and sucralfate.   DVT prophylaxis:enoxaparin Code Status:full Family Communication:no family at the bedside Disposition Plan/ discharge barriers:pending completion of antibiotic therapy.    Body mass index is 31.08 kg/m. Malnutrition Type:      Malnutrition Characteristics:      Nutrition Interventions:     RN Pressure Injury Documentation:     Consultants:     Procedures:           Subjective: Patient is feeling better, her abdominal pain and reflux have improved, no nausea or vomiting. Back pain is well controlled.   Objective: Vitals:   07/10/18 0751 07/10/18 1741 07/10/18 2218 07/11/18 0751  BP: (!) 142/97 (!) 132/98 (!) 132/96 121/82  Pulse: 83 77 84 85  Resp:   19   Temp: (!) 97.5 F (36.4 C) 97.6 F (36.4 C) 98 F (36.7 C) 98 F (36.7 C)  TempSrc:   Oral   SpO2: 98% 99% 98% 97%  Weight:      Height:       No intake or output data in the 24 hours ending 07/11/18 1453 Filed Weights   07/03/18 0500 07/05/18 0500 07/08/18 0425  Weight: 84.5 kg 89.3 kg 90 kg    Examination:   General: Not in pain or dyspnea, deconditioned  Neurology: Awake and alert, non focal  E ENT: mild pallor, no icterus, oral mucosa moist Cardiovascular: No JVD. S1-S2 present, rhythmic, no gallops, rubs, or murmurs. No lower extremity edema. Pulmonary: positive breath sounds bilaterally, adequate air movement, no wheezing, rhonchi or rales. Gastrointestinal.  Abdomen with no organomegaly, non tender, no rebound or guarding Skin. No rashes Musculoskeletal: no joint deformities     Data Reviewed: I have personally reviewed following labs and imaging studies  CBC: Recent Labs  Lab 07/10/18 0650 07/10/18 0835    WBC 6.6 6.9  NEUTROABS 4.5 4.9  HGB 7.3* 7.6*  HCT 24.7* 24.9*  MCV 92.9 92.2  PLT 461* 864*   Basic Metabolic Panel: Recent Labs  Lab 07/08/18 0345 07/10/18 0650 07/10/18 0835  NA  --  122* 135  K  --  3.2* 3.9  CL  --  91* 103  CO2  --  22 26  GLUCOSE  --  549* 76  BUN  --  8 8  CREATININE 0.46 0.60 0.44  CALCIUM  --  7.0* 8.0*   GFR: Estimated Creatinine Clearance: 115.3 mL/min (by C-G formula based on SCr of 0.44 mg/dL). Liver Function Tests: No results for input(s): AST, ALT, ALKPHOS, BILITOT, PROT, ALBUMIN in the last 168 hours. No results for input(s): LIPASE, AMYLASE in the last 168 hours. No results for input(s): AMMONIA in the last 168 hours. Coagulation Profile: No results for input(s): INR, PROTIME in the last 168 hours. Cardiac Enzymes: No results for input(s): CKTOTAL, CKMB, CKMBINDEX, TROPONINI in the last 168 hours. BNP (last 3 results) No results for input(s): PROBNP in the last 8760 hours. HbA1C: No results for input(s): HGBA1C in the last 72 hours. CBG: No results for input(s): GLUCAP in the last 168 hours. Lipid Profile: No results for input(s): CHOL, HDL, LDLCALC, TRIG, CHOLHDL, LDLDIRECT in the last 72 hours. Thyroid Function Tests: No results for input(s): TSH, T4TOTAL, FREET4, T3FREE, THYROIDAB in the last 72 hours. Anemia Panel: No results for input(s): VITAMINB12, FOLATE, FERRITIN, TIBC, IRON, RETICCTPCT in the last 72 hours.    Radiology Studies: I have reviewed all of the imaging during this hospital visit personally     Scheduled Meds: . enoxaparin (LOVENOX) injection  40 mg Subcutaneous Q24H  . feeding supplement (ENSURE ENLIVE)  237 mL Oral BID BM  . methadone  40 mg Oral q1800  . methadone  60 mg Oral Daily  . pantoprazole  40 mg Oral BID  . prenatal multivitamin  1 tablet Oral Q1200  . sodium chloride flush  10-40 mL Intracatheter Q12H  . sodium chloride flush  3 mL Intravenous Q12H  . sucralfate  1 g Oral TID WC & HS    Continuous Infusions: . ampicillin (OMNIPEN) IV 2 g (07/11/18 1230)  . cefTRIAXone (ROCEPHIN)  IV 2 g (07/11/18 1023)  . lactated ringers 10 mL/hr at 07/03/18 2101  . vancomycin 1,000 mg (07/11/18 1425)     LOS: 26 days        Kimeka Badour Gerome Apley, MD

## 2018-07-11 NOTE — Progress Notes (Signed)
Pharmacy Antibiotic Note  Kelly [name] is a 33 y.o. female admitted on 06/14/2018 with strep and enterococcal endocarditis.  Patient continues on  IV ceftriaxone, vancomycin, ampicillin for Bacteremia due to Enterococcus and Streptococcus- bacterial endocarditis tricuspid valve, multifocal pneumonia.   Vancomycin needed as Strep sanguinis species resulted as resistant to ceftriaxone and penicillin.  These three IV antibiotics to continue x 6 weeks per ID recommendation, through 08/03/18.  Patient to stay inpatient for duration of IV antibiotic therapy.    Last vancomycin levels done on 07/08/18 revealed therapeutic AUC 531.8 on Vancomycin 1000 mg IV q8h.  Goal AUC 400-550.  Afebrile, WBC wnl on 2/27, and renal function remains stable.  Patient reports urine occurrence is normal to more freq 2/2 "thinks baby sitting on bladder".    Plan: Continue vancomycin 1000 mg IV q8h Continue ampicillin and Rocephin per MD Monitor renal fxn, repeat vanomycin levels at least weekly, AUC as appropriate.  Plan for next vancomycin levels and SCr is on 07/15/18.   Height: 5' 7"  (170.2 cm) Weight: 198 lb 6.6 oz (90 kg) IBW/kg (Calculated) : 61.6  Temp (24hrs), Avg:97.9 F (36.6 C), Min:97.6 F (36.4 C), Max:98 F (36.7 C)  Recent Labs  Lab 07/08/18 0345 07/08/18 0932 07/10/18 0650 07/10/18 0835  WBC  --   --  6.6 6.9  CREATININE 0.46  --  0.60 0.44  VANCOTROUGH  --  13*  --   --   VANCOPEAK 29*  --   --   --     Estimated Creatinine Clearance: 115.3 mL/min (by C-G formula based on SCr of 0.44 mg/dL).    Allergies  Allergen Reactions  . Hydrocodone Itching  . Morphine And Related Nausea And Vomiting    Azithromycin 2/1>>2/2 Vancomcyin 2/1>>2/2, 2/5>>(3/22) Ceftriaxone 2/1>>(3/22) Ampicillin 2/4>>(3/22) Tamiflu 2/3>>2/7  2/6: Levels 19/11 (5 hours apart) -  on 1gm q8hh - AUC low at 389 on this dose >> dose adjusted to 2g IV q12h for estimated AUC 517 2/9: Levels 39/9 (12 hrs  apart) - on Vanc 2gm IV q12h - >AUC supratherapeutic at 597 (goal 400-550) > adjusted to 1250 mg IV q8h for estimated AUC 556 - Calculated Ke 0.2185, half-life 3.2 hrs 2/12: VP/VT 30/14, AUC 577 on 1213m q8 >> 1g q8 2/17: VP/VT 19/13, AUC 505.6 on [id number], ke 0.1198, t1/2 5.8h - cont 1000 mg q8h 2/25: VP/VT 29/13, AUC 531.8 on 1000 mg q8h, ke 0.138, t1/2 5.0h-continue 1000 mg q8h   2/1 Blood x 2: Enterococcus faecalis (pan-S), Strep sanguinis 1/2 (R-PCN and CTX, S-Vanc) and strep mitis/oralis (R-tetra, I-PCN, S-Vanc/Clinda/CTX) 2/1 Urine: E coli - sensitive to cefazolin, ceftriaxone; R - amp, cipro, septra, unasyn, zosyn 2/3 Influenza PCR - positive B 2/3 Blood x 2: negative 2/7 RPR negative 2/7 HIV non-reactive 2/7 Rubella < 0.90 = non-immune 2/13 urine - negative 2/20 MRSA PCR: negative  Thank you for allowing pharmacy to be part of this patients care team.  RuNicole CellaRPFort Stewartharmacist [phone] check AMION for all [facility] numbers After 10:00 PM, call [phone]/28/2020 12:28 PM

## 2018-07-12 DIAGNOSIS — I38 Endocarditis, valve unspecified: Secondary | ICD-10-CM

## 2018-07-12 DIAGNOSIS — I079 Rheumatic tricuspid valve disease, unspecified: Secondary | ICD-10-CM

## 2018-07-12 MED ORDER — BISACODYL 5 MG PO TBEC
5.0000 mg | DELAYED_RELEASE_TABLET | Freq: Every day | ORAL | Status: DC | PRN
  Administered 2018-07-12: 5 mg via ORAL
  Filled 2018-07-12 (×2): qty 1

## 2018-07-12 NOTE — Progress Notes (Signed)
PROGRESS NOTE    Kelly [name]  ION:[id number] DOB: [birth year] DOA: 06/14/2018 PCP: [facility]    Brief Narrative:  33 year old female who presents with low back pain, cough and malaise. 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 Patient reported 7 days of worsening back pain, associated with particular cough, occasional dyspnea and generalized malaise. Apparently she has been using IV drugs. She has bee pain OB for a current pregnancy. On her initial physical examination she was tachypneic 30 because per minute, tachycardic 120 bpm, afebrile, oxygen saturation 90%, she had moist mucous membranes, lungs clear to auscultation bilaterally, heart S1-S2 present rhythmic, abdomen gravid uterus, positive bilateral pitting lower extremity edema. Her chest x-ray showed bilateral multilobar opacities.  She was admitted to the hospital with working diagnosis of multilobar bilateral pneumonia   Assessment & Plan:   Principal Problem:   Bacteremia due to Enterococcus Active Problems:   Opioid use disorder, severe, dependence (HCC)   Sepsis due to pneumonia ([place])   IV drug abuse complicating pregnancy ([place])   Multifocal pneumonia   Hyponatremia   Elevated transaminase level   Degenerative arthritis of lumbar spine   History of bacterial endocarditis   Severe tricuspid regurgitation by prior echocardiogram   Influenza B   Rubella non-immune status, antepartum   High-risk pregnancy in second trimester   Preexisting hypertension complicating pregnancy, antepartum   Dental caries   Phobia of dental procedure  1. Tricuspid valve endocarditis, with enterococcus and streptococcus bacteremia, complicated with septic emboli to the lungs, multifocal pneumonia.(plan to complete therapy 08/03/2018.) Continue with IVvancomycin, ampicillin and ceftriaxone, for  antibiotic therapy.     2, Opioid use disorder. On methadone, pain is controlled.   3. Pregnancy. Continue OB follow up.    4. GERD. Continue pantoprazole, will dc sucralfate.   DVT prophylaxis:enoxaparin Code Status:full Family Communication:no family at the bedside Disposition Plan/ discharge barriers:pending completion of antibiotic therapy   Body mass index is 31.08 kg/m. Malnutrition Type:      Malnutrition Characteristics:      Nutrition Interventions:     RN Pressure Injury Documentation:     Consultants:     Procedures:     Antimicrobials:    Vancomycin, ampicillin, ceftriaxone   Subjective: Patient is feeling well, dyspepsia has improved, but has not had a recent bowel movement.   Objective: Vitals:   07/11/18 0751 07/11/18 1759 07/11/18 2357 07/12/18 0729  BP: 121/82 (!) 145/93 127/90 124/88  Pulse: 85 81 84 91  Resp:    15  Temp: 98 F (36.7 C) (!) 97.5 F (36.4 C) 98 F (36.7 C) (!) 97.3 F (36.3 C)  TempSrc:   Oral Oral  SpO2: 97% 99% 96% 97%  Weight:      Height:        Intake/Output Summary (Last 24 hours) at 07/12/2018 1557 Last data filed at 07/12/2018 2440 Gross per 24 hour  Intake 2011.95 ml  Output -  Net 2011.95 ml   Filed Weights   07/03/18 0500 07/05/18 0500 07/08/18 0425  Weight: 84.5 kg 89.3 kg 90 kg    Examination:   General: Not in pain or dyspnea  Neurology: Awake and alert, non focal  E ENT: mild pallor, no icterus, oral mucosa moist Cardiovascular: No JVD. S1-S2 present, rhythmic, no gallops, rubs, or murmurs. No lower extremity edema. Pulmonary: positive breath sounds bilaterally, adequate air movement, no wheezing,  rhonchi or rales. Gastrointestinal. Abdomen with no organomegaly, non tender, no rebound or guarding Skin. No rashes Musculoskeletal: no joint deformities     Data Reviewed: I have personally reviewed following labs and imaging studies  CBC: Recent Labs  Lab  07/10/18 0650 07/10/18 0835  WBC 6.6 6.9  NEUTROABS 4.5 4.9  HGB 7.3* 7.6*  HCT 24.7* 24.9*  MCV 92.9 92.2  PLT 461* 606*   Basic Metabolic Panel: Recent Labs  Lab 07/08/18 0345 07/10/18 0650 07/10/18 0835  NA  --  122* 135  K  --  3.2* 3.9  CL  --  91* 103  CO2  --  22 26  GLUCOSE  --  549* 76  BUN  --  8 8  CREATININE 0.46 0.60 0.44  CALCIUM  --  7.0* 8.0*   GFR: Estimated Creatinine Clearance: 115.3 mL/min (by C-G formula based on SCr of 0.44 mg/dL). Liver Function Tests: No results for input(s): AST, ALT, ALKPHOS, BILITOT, PROT, ALBUMIN in the last 168 hours. No results for input(s): LIPASE, AMYLASE in the last 168 hours. No results for input(s): AMMONIA in the last 168 hours. Coagulation Profile: No results for input(s): INR, PROTIME in the last 168 hours. Cardiac Enzymes: No results for input(s): CKTOTAL, CKMB, CKMBINDEX, TROPONINI in the last 168 hours. BNP (last 3 results) No results for input(s): PROBNP in the last 8760 hours. HbA1C: No results for input(s): HGBA1C in the last 72 hours. CBG: No results for input(s): GLUCAP in the last 168 hours. Lipid Profile: No results for input(s): CHOL, HDL, LDLCALC, TRIG, CHOLHDL, LDLDIRECT in the last 72 hours. Thyroid Function Tests: No results for input(s): TSH, T4TOTAL, FREET4, T3FREE, THYROIDAB in the last 72 hours. Anemia Panel: No results for input(s): VITAMINB12, FOLATE, FERRITIN, TIBC, IRON, RETICCTPCT in the last 72 hours.    Radiology Studies: I have reviewed all of the imaging during this hospital visit personally     Scheduled Meds: . enoxaparin (LOVENOX) injection  40 mg Subcutaneous Q24H  . feeding supplement (ENSURE ENLIVE)  237 mL Oral BID BM  . methadone  40 mg Oral q1800  . methadone  60 mg Oral Daily  . pantoprazole  40 mg Oral BID  . prenatal multivitamin  1 tablet Oral Q1200  . sodium chloride flush  10-40 mL Intracatheter Q12H  . sodium chloride flush  3 mL Intravenous Q12H    Continuous Infusions: . ampicillin (OMNIPEN) IV 2 g (07/12/18 1220)  . cefTRIAXone (ROCEPHIN)  IV 2 g (07/12/18 0936)  . lactated ringers 10 mL/hr at 07/03/18 2101  . vancomycin 1,000 mg (07/12/18 1221)     LOS: 27 days        Deshawnda Acrey Gerome Apley, MD

## 2018-07-13 NOTE — Progress Notes (Signed)
PROGRESS NOTE    Kelly [name]  VOH:[id number] DOB: Jul 11, 1985 DOA: 06/14/2018 PCP: [facility]    Brief Narrative:  33 year old female who presents with low back pain, cough and malaise. 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 Patient reported 7 days of worsening back pain, associated with particular cough, occasional dyspnea and generalized malaise. Apparently she has been using IV drugs. She has bee pain OB for a current pregnancy. On her initial physical examination she was tachypneic 30 because per minute, tachycardic 120 bpm, afebrile, oxygen saturation 90%, she had moist mucous membranes, lungs clear to auscultation bilaterally, heart S1-S2 present rhythmic, abdomen gravid uterus, positive bilateral pitting lower extremity edema. Her chest x-ray showed bilateral multilobar opacities.  She was admitted to the hospital with working diagnosis of multilobar bilateral pneumonia   Assessment & Plan:   Principal Problem:   Bacteremia due to Enterococcus Active Problems:   Opioid use disorder, severe, dependence (HCC)   Sepsis due to pneumonia ([place])   IV drug abuse complicating pregnancy ([place])   Multifocal pneumonia   Hyponatremia   Elevated transaminase level   Degenerative arthritis of lumbar spine   History of bacterial endocarditis   Severe tricuspid regurgitation by prior echocardiogram   Influenza B   Rubella non-immune status, antepartum   High-risk pregnancy in second trimester   Preexisting hypertension complicating pregnancy, antepartum   Dental caries   Phobia of dental procedure   Endocarditis  1. Tricuspid valve endocarditis, with enterococcus and streptococcus bacteremia, complicated with septic emboli to the lungs, multifocal pneumonia.(plan to complete therapy 08/03/2018.)  IVvancomycin, ampicillin and ceftriaxone,  tolerating well.   2, Opioid use disorder.Continue withmethadone, pain has remained well controlled.   3. Pregnancy.Continue in-hospital OB follow up.   4. GERD.Continuepantoprazole.  5. Constipation. Will add miralax bid, due to persistent constipation.   DVT prophylaxis:enoxaparin Code Status:full Family Communication:no family at the bedside Disposition Plan/ discharge barriers:pending completion of antibiotic therapy    Body mass index is 31.08 kg/m. Malnutrition Type:      Malnutrition Characteristics:      Nutrition Interventions:     RN Pressure Injury Documentation:     Consultants:     Procedures:     Antimicrobials:       Subjective: Patient is feeling well, continue to have constipation, dyspepsia has improved, no nausea or vomiting.   Objective: Vitals:   07/12/18 0729 07/12/18 1705 07/12/18 2345 07/13/18 0719  BP: 124/88 118/80 102/61 126/90  Pulse: 91 85 92 89  Resp: 15  17 17   Temp: (!) 97.3 F (36.3 C) 98.2 F (36.8 C) 97.7 F (36.5 C) 97.7 F (36.5 C)  TempSrc: Oral Oral Oral Oral  SpO2: 97% 97% 96% 99%  Weight:      Height:        Intake/Output Summary (Last 24 hours) at 07/13/2018 1219 Last data filed at 07/13/2018 0720 Gross per 24 hour  Intake 2177.2 ml  Output -  Net 2177.2 ml   Filed Weights   07/03/18 0500 07/05/18 0500 07/08/18 0425  Weight: 84.5 kg 89.3 kg 90 kg    Examination:   General: Not in pain or dyspnea  Neurology: Awake and alert, non focal  E ENT: no pallor, no icterus, oral mucosa moist Cardiovascular: No JVD. S1-S2 present, rhythmic, no gallops, rubs, or murmurs. No lower extremity edema. Pulmonary: vesicular breath sounds bilaterally, adequate air movement,  no wheezing, rhonchi or rales. Gastrointestinal. Abdomen with no organomegaly, non tender, no rebound or guarding Skin. No rashes Musculoskeletal: no joint deformities     Data Reviewed: I have personally reviewed  following labs and imaging studies  CBC: Recent Labs  Lab 07/10/18 0650 07/10/18 0835  WBC 6.6 6.9  NEUTROABS 4.5 4.9  HGB 7.3* 7.6*  HCT 24.7* 24.9*  MCV 92.9 92.2  PLT 461* 364*   Basic Metabolic Panel: Recent Labs  Lab 07/08/18 0345 07/10/18 0650 07/10/18 0835  NA  --  122* 135  K  --  3.2* 3.9  CL  --  91* 103  CO2  --  22 26  GLUCOSE  --  549* 76  BUN  --  8 8  CREATININE 0.46 0.60 0.44  CALCIUM  --  7.0* 8.0*   GFR: Estimated Creatinine Clearance: 115.3 mL/min (by C-G formula based on SCr of 0.44 mg/dL). Liver Function Tests: No results for input(s): AST, ALT, ALKPHOS, BILITOT, PROT, ALBUMIN in the last 168 hours. No results for input(s): LIPASE, AMYLASE in the last 168 hours. No results for input(s): AMMONIA in the last 168 hours. Coagulation Profile: No results for input(s): INR, PROTIME in the last 168 hours. Cardiac Enzymes: No results for input(s): CKTOTAL, CKMB, CKMBINDEX, TROPONINI in the last 168 hours. BNP (last 3 results) No results for input(s): PROBNP in the last 8760 hours. HbA1C: No results for input(s): HGBA1C in the last 72 hours. CBG: No results for input(s): GLUCAP in the last 168 hours. Lipid Profile: No results for input(s): CHOL, HDL, LDLCALC, TRIG, CHOLHDL, LDLDIRECT in the last 72 hours. Thyroid Function Tests: No results for input(s): TSH, T4TOTAL, FREET4, T3FREE, THYROIDAB in the last 72 hours. Anemia Panel: No results for input(s): VITAMINB12, FOLATE, FERRITIN, TIBC, IRON, RETICCTPCT in the last 72 hours.    Radiology Studies: I have reviewed all of the imaging during this hospital visit personally     Scheduled Meds: . enoxaparin (LOVENOX) injection  40 mg Subcutaneous Q24H  . feeding supplement (ENSURE ENLIVE)  237 mL Oral BID BM  . methadone  40 mg Oral q1800  . methadone  60 mg Oral Daily  . pantoprazole  40 mg Oral BID  . prenatal multivitamin  1 tablet Oral Q1200  . sodium chloride flush  10-40 mL Intracatheter  Q12H  . sodium chloride flush  3 mL Intravenous Q12H   Continuous Infusions: . ampicillin (OMNIPEN) IV 2 g (07/13/18 1144)  . cefTRIAXone (ROCEPHIN)  IV 2 g (07/13/18 0922)  . lactated ringers 10 mL/hr at 07/03/18 2101  . vancomycin 1,000 mg (07/13/18 1145)     LOS: 28 days        Kelly [name] Gerome Apley, MD

## 2018-07-13 NOTE — Progress Notes (Signed)
CSW provided patient with two facility options for her that could assist with helping her get clean and assist with her pregnancy.   1. [facility] 2. [facility] provided these resources to the patient. The patient will have to call and initiate treatment at [facility].   CSW will continue to monitor patients progress and assist with further needs closer to discharge.   Domenic Schwab, MSW, [place]

## 2018-07-14 ENCOUNTER — Encounter: Payer: Self-pay | Admitting: Obstetrics and Gynecology

## 2018-07-14 DIAGNOSIS — Z3A24 24 weeks gestation of pregnancy: Secondary | ICD-10-CM

## 2018-07-14 MED ORDER — BISACODYL 10 MG RE SUPP
10.0000 mg | Freq: Every day | RECTAL | Status: DC | PRN
  Administered 2018-07-15 – 2018-07-31 (×4): 10 mg via RECTAL
  Filled 2018-07-14 (×6): qty 1

## 2018-07-14 MED ORDER — DIPHENHYDRAMINE HCL 25 MG PO CAPS
25.0000 mg | ORAL_CAPSULE | Freq: Four times a day (QID) | ORAL | Status: DC | PRN
  Administered 2018-07-15: 25 mg via ORAL
  Filled 2018-07-14: qty 1

## 2018-07-14 NOTE — Progress Notes (Signed)
PROGRESS NOTE    Kelly [name]  AJO:[id number] DOB: 1985/07/25 DOA: 06/14/2018 PCP: [facility]    Brief Narrative:  33 year old female who presents with low back pain, cough and malaise. 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 Patient reported 7 days of worsening back pain, associated with particular cough, occasional dyspnea and generalized malaise. Apparently she has been using IV drugs. She has bee pain OB for a current pregnancy. On her initial physical examination she was tachypneic 30 because per minute, tachycardic 120 bpm, afebrile, oxygen saturation 90%, she had moist mucous membranes, lungs clear to auscultation bilaterally, heart S1-S2 present rhythmic, abdomen gravid uterus, positive bilateral pitting lower extremity edema. Her chest x-ray showed bilateral multilobar opacities.  She was admitted to the hospital with working diagnosis of multilobar bilateral pneumonia   Assessment & Plan:   Principal Problem:   Bacteremia due to Enterococcus Active Problems:   Opioid use disorder, severe, dependence (HCC)   Sepsis due to pneumonia ([place])   IV drug abuse complicating pregnancy ([place])   Multifocal pneumonia   Hyponatremia   Elevated transaminase level   Degenerative arthritis of lumbar spine   History of bacterial endocarditis   Severe tricuspid regurgitation by prior echocardiogram   Influenza B   Rubella non-immune status, antepartum   High-risk pregnancy in second trimester   Preexisting hypertension complicating pregnancy, antepartum   Dental caries   Phobia of dental procedure   Endocarditis   1. Tricuspid valve endocarditis, with enterococcus and streptococcus bacteremia, complicated with septic emboli to the lungs, multifocal pneumonia.(plan to complete therapy 08/03/2018.)Continue withIVvancomycin, ampicillin and  ceftriaxone.   2, Opioid use disorder.On methadone, pain has remained well controlled.  3. Pregnancy.Continuein-hospital OBfollow up.  4. GERD. Onpantoprazole with improved dyspepsia.   5. Constipation. Persistent constipation, will add dulolax suppository and continue with miralax bid.  DVT prophylaxis:enoxaparin Code Status:full Family Communication:no family at the bedside Disposition Plan/ discharge barriers:pending completion of antibiotic therapy   Body mass index is 31.08 kg/m. Malnutrition Type:      Malnutrition Characteristics:      Nutrition Interventions:     RN Pressure Injury Documentation:     Consultants:     Procedures:      Subjective: Patient continue to have constipation, no nausea or vomiting, no chest pain or dyspnea, dyspepsia has improved.   Objective: Vitals:   07/12/18 2345 07/13/18 0719 07/13/18 2341 07/14/18 0753  BP: 102/61 126/90 (!) 127/92 117/81  Pulse: 92 89 88 83  Resp: 17 17 18    Temp: 97.7 F (36.5 C) 97.7 F (36.5 C) 97.6 F (36.4 C) (!) 97.5 F (36.4 C)  TempSrc: Oral Oral Oral   SpO2: 96% 99% 97% 96%  Weight:      Height:       No intake or output data in the 24 hours ending 07/14/18 1334 Filed Weights   07/03/18 0500 07/05/18 0500 07/08/18 0425  Weight: 84.5 kg 89.3 kg 90 kg    Examination:   General: Not in pain or dyspnea  Neurology: Awake and alert, non focal  E ENT: no pallor, no icterus, oral mucosa moist Cardiovascular: No JVD. S1-S2 present, rhythmic, no gallops, rubs, or murmurs. No lower extremity edema. Pulmonary: positive breath sounds bilaterally, adequate air movement, no wheezing, rhonchi or rales. Gastrointestinal. Abdomen with no organomegaly, non tender, no rebound or guarding Skin. No rashes Musculoskeletal: no joint deformities  Data Reviewed: I have personally reviewed following labs and imaging studies  CBC: Recent Labs  Lab 07/10/18 0650  07/10/18 0835  WBC 6.6 6.9  NEUTROABS 4.5 4.9  HGB 7.3* 7.6*  HCT 24.7* 24.9*  MCV 92.9 92.2  PLT 461* 213*   Basic Metabolic Panel: Recent Labs  Lab 07/08/18 0345 07/10/18 0650 07/10/18 0835  NA  --  122* 135  K  --  3.2* 3.9  CL  --  91* 103  CO2  --  22 26  GLUCOSE  --  549* 76  BUN  --  8 8  CREATININE 0.46 0.60 0.44  CALCIUM  --  7.0* 8.0*   GFR: Estimated Creatinine Clearance: 115.3 mL/min (by C-G formula based on SCr of 0.44 mg/dL). Liver Function Tests: No results for input(s): AST, ALT, ALKPHOS, BILITOT, PROT, ALBUMIN in the last 168 hours. No results for input(s): LIPASE, AMYLASE in the last 168 hours. No results for input(s): AMMONIA in the last 168 hours. Coagulation Profile: No results for input(s): INR, PROTIME in the last 168 hours. Cardiac Enzymes: No results for input(s): CKTOTAL, CKMB, CKMBINDEX, TROPONINI in the last 168 hours. BNP (last 3 results) No results for input(s): PROBNP in the last 8760 hours. HbA1C: No results for input(s): HGBA1C in the last 72 hours. CBG: No results for input(s): GLUCAP in the last 168 hours. Lipid Profile: No results for input(s): CHOL, HDL, LDLCALC, TRIG, CHOLHDL, LDLDIRECT in the last 72 hours. Thyroid Function Tests: No results for input(s): TSH, T4TOTAL, FREET4, T3FREE, THYROIDAB in the last 72 hours. Anemia Panel: No results for input(s): VITAMINB12, FOLATE, FERRITIN, TIBC, IRON, RETICCTPCT in the last 72 hours.    Radiology Studies: I have reviewed all of the imaging during this hospital visit personally     Scheduled Meds: . enoxaparin (LOVENOX) injection  40 mg Subcutaneous Q24H  . feeding supplement (ENSURE ENLIVE)  237 mL Oral BID BM  . methadone  40 mg Oral q1800  . methadone  60 mg Oral Daily  . pantoprazole  40 mg Oral BID  . prenatal multivitamin  1 tablet Oral Q1200  . sodium chloride flush  10-40 mL Intracatheter Q12H  . sodium chloride flush  3 mL Intravenous Q12H   Continuous  Infusions: . ampicillin (OMNIPEN) IV 2 g (07/14/18 0800)  . cefTRIAXone (ROCEPHIN)  IV 2 g (07/14/18 0951)  . lactated ringers 10 mL/hr at 07/03/18 2101  . vancomycin 1,000 mg (07/14/18 0528)     LOS: 29 days        Stepfon Rawles Gerome Apley, MD

## 2018-07-14 NOTE — Progress Notes (Signed)
Spoke with Dr. Ilda Basset. Pt is a G6P1 at 24 2/[redacted] weeks gestation. FHR baseline is 145 BPM, min-mod variability, no accels, no decels. FHR tracing is appropriate for gestational age. No uc's noted. Pt denies uc's, vaginal bleeding or leaking of fluid. Pt has had some elevated blood pressures. Dr. Ilda Basset says he will review the pt's chart and he will see her at some point today. Social work has put a note in the chart that the pt is trying to decide on a facility to assist with drug rehab and her pregnancy.Orders received for [facility]ST every day.

## 2018-07-14 NOTE — Care Management Note (Signed)
Case Management Note  Patient Details  Name: TONIYA ROZAR MRN: [id number] Date of Birth: 1985/07/30  Subjective/Objective:   Patient wanted to speak with NCM , she states she would like for [place] to find a place for her to go at discharge, she was at a hotel pta but does not want to go back there, she wants to get clean from drugs.  She mentioned [employer] or places similar to [employer], NCM informed her we would look into this for her and not to worry.                 Action/Plan: NCM will follow for transition of care needs.  Expected Discharge Date:                  Expected Discharge Plan:  Home/Self Care  In-House Referral:     Discharge planning Services  CM Consult  Post Acute Care Choice:    Choice offered to:     DME Arranged:    DME Agency:     HH Arranged:    HH Agency:     Status of Service:  In process, will continue to follow  If discussed at Long Length of Stay Meetings, dates discussed:    Additional Comments:  Zenon Mayo, RN 07/14/2018, 4:24 PM

## 2018-07-14 NOTE — Progress Notes (Signed)
**This progress note is done as a "documentation encounter" because the EMR crashes when I try to document in her inpatient chart**   Antepartum Note  Admission Date: 06/14/2018 Current Date: 07/14/2018 1:51 PM  Kelly [name] is a 33 y.o. [id number] @ [redacted]w[redacted]d admitted for complications related to IV drug abuse.  Pregnancy complicated by: Patient Active Problem List   Diagnosis Date Noted  . Endocarditis 07/12/2018  . Dental caries 07/03/2018  . 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 Preexisting hypertension complicating pregnancy, antepartum 06/29/2018  . High-risk pregnancy in second trimester 06/27/2018  . Rubella non-immune status, antepartum 06/23/2018  . Influenza B 06/17/2018  . Bacteremia due to Enterococcus 06/15/2018  . Multifocal pneumonia 06/14/2018  . Hyponatremia 06/14/2018  . Elevated transaminase level 06/14/2018  . 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 History of bacterial endocarditis 06/14/2018  . Severe tricuspid regurgitation by prior echocardiogram 06/14/2018  . 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 Difficult intravenous access   . IV drug abuse complicating pregnancy ([place]   . AKI (acute kidney injury) ([place]   . Sepsis due to pneumonia ([place] 12/02/2017  . Bipolar disorder ([place] 07/31/2016  . Major depressive disorder, recurrent episode with anxious distress ([facility] 07/29/2016  . Normal labor 07/26/2016  . Foreign body granuloma of soft tissue, NEC, right upper arm   . Pyelonephritis 05/28/2016  . Polysubstance abuse ([place] 05/25/2016  . No prenatal care in current pregnancy 05/25/2016  . Chronic hypertension affecting pregnancy 05/25/2016  . History of cesarean delivery affecting pregnancy 05/25/2016  . Community acquired pneumonia 05/25/2016  . Pyelonephritis affecting pregnancy 05/24/2016  . Palliative care encounter   . Abscess of right breast 07/13/2014  . Opioid use disorder, severe, dependence  ([place] 08/25/2013  . Unspecified episodic mood disorder 08/25/2013  . ADHD (attention deficit hyperactivity disorder) 08/25/2013  . Cocaine abuse ([place] 08/25/2013  . Thoracic back pain 01/29/2013  . CROHN'S DISEASE 05/03/2005  . COLONIC POLYPS 02/28/2003    Overnight/24hr events:  None  Subjective:  No s/s of preterm labor or decreased FM  Objective:  AF VS normal and stable with occasional mild range BPs  Physical exam: General: Well nourished, well developed female in no acute distress. Abdomen: gravid nttp Respiratory: no respiratory distress Extremities: no clubbing, cyanosis or edema Skin: Warm and dry.   Medications: No current facility-administered medications for this visit.    No current outpatient medications on file.   Facility-Administered Medications Ordered in Other Visits  Medication Dose Route Frequency Provider Last Rate Last Dose  . acetaminophen (TYLENOL) tablet 650 mg  650 mg Oral Q6H PRN KLenn Cal DDS   650 mg at 07/12/18 2134   Or  . acetaminophen (TYLENOL) suppository 650 mg  650 mg Rectal Q6H PRN KLenn Cal DDS      . albuterol (PROVENTIL) (2.5 MG/3ML) 0.083% nebulizer solution 2.5 mg  2.5 mg Nebulization Q2H PRN KTeena DunkF, DDS   2.5 mg at 06/24/18 2051  . ampicillin (OMNIPEN) 2 g in sodium chloride 0.9 % 100 mL IVPB  2 g Intravenous Q4H KTeena DunkF, DDS 300 mL/hr at 07/14/18 0800 2 g at 07/14/18 0800  . benzonatate (TESSALON) capsule 200 mg  200 mg Oral TID PRN KLenn Cal DDS   200 mg at 07/04/18 09833 . bisacodyl (DULCOLAX) suppository 10 mg  10 mg Rectal Daily PRN Arrien, MJimmy Picket MD      . calcium carbonate (TUMS -  dosed in mg elemental calcium) chewable tablet 200 mg of elemental calcium  1 tablet Oral BID PRN Arrien, Jimmy Picket, MD      . cefTRIAXone (ROCEPHIN) 2 g in sodium chloride 0.9 % 100 mL IVPB  2 g Intravenous Q12H Teena Dunk F, DDS 200 mL/hr at 07/14/18 0951 2 g at 07/14/18  0951  . diphenhydrAMINE (BENADRYL) injection 25 mg  25 mg Intravenous Q6H PRN Teena Dunk F, DDS   25 mg at 07/14/18 0946  . docusate sodium (COLACE) capsule 100 mg  100 mg Oral Daily PRN Arrien, Jimmy Picket, MD   100 mg at 07/14/18 1324  . enoxaparin (LOVENOX) injection 40 mg  40 mg Subcutaneous Q24H Debbe Odea, MD   40 mg at 07/13/18 1805  . feeding supplement (ENSURE ENLIVE) (ENSURE ENLIVE) liquid 237 mL  237 mL Oral BID BM Teena Dunk F, DDS   237 mL at 07/14/18 1002  . ibuprofen (ADVIL,MOTRIN) tablet 400 mg  400 mg Oral Q6H PRN Arrien, Jimmy Picket, MD      . lactated ringers infusion   Intravenous Continuous Lenn Cal, DDS 10 mL/hr at 07/03/18 2101    . methadone (DOLOPHINE) tablet 40 mg  40 mg Oral q1800 Lenn Cal, DDS   40 mg at 07/13/18 1804  . methadone (DOLOPHINE) tablet 60 mg  60 mg Oral Daily Lenn Cal, DDS   60 mg at 07/14/18 0947  . ondansetron (ZOFRAN) injection 4 mg  4 mg Intravenous Q6H PRN Teena Dunk F, DDS   4 mg at 07/14/18 1325  . pantoprazole (PROTONIX) EC tablet 40 mg  40 mg Oral BID Tawni Millers, MD   40 mg at 07/14/18 0946  . phenol (CHLORASEPTIC) mouth spray 1 spray  1 spray Mouth/Throat PRN Lenn Cal, DDS   1 spray at 06/25/18 2046  . prenatal multivitamin tablet 1 tablet  1 tablet Oral Q1200 Lenn Cal, DDS   1 tablet at 07/13/18 0919  . promethazine (PHENERGAN) injection 12.5-25 mg  12.5-25 mg Intravenous Q6H PRN Teena Dunk F, DDS   25 mg at 07/14/18 0946  . sodium chloride (OCEAN) 0.65 % nasal spray 2 spray  2 spray Each Nare PRN Teena Dunk F, DDS      . sodium chloride flush (NS) 0.9 % injection 10-40 mL  10-40 mL Intracatheter Q12H Teena Dunk F, DDS   10 mL at 07/14/18 0956  . sodium chloride flush (NS) 0.9 % injection 10-40 mL  10-40 mL Intracatheter PRN Teena Dunk F, DDS      . sodium chloride flush (NS) 0.9 % injection 3 mL  3 mL Intravenous Q12H Teena Dunk F, DDS   3 mL at 07/14/18 0956  . vancomycin (VANCOCIN) IVPB 1000 mg/200 mL premix  1,000 mg Intravenous Q8H Teena Dunk F, DDS 200 mL/hr at 07/14/18 0528 1,000 mg at 07/14/18 0528    Labs:  Recent Labs  Lab 07/10/18 0650 07/10/18 0835  WBC 6.6 6.9  HGB 7.3* 7.6*  HCT 24.7* 24.9*  PLT 461* 458*    Recent Labs  Lab 07/08/18 0345 07/10/18 0650 07/10/18 0835  NA  --  122* 135  K  --  3.2* 3.9  CL  --  91* 103  CO2  --  22 26  BUN  --  8 8  CREATININE 0.46 0.60 0.44  CALCIUM  --  7.0* 8.0*  GLUCOSE  --  549* 76    Radiology: no new imaging  Assessment & Plan:  Pt stable *Pregnancy: per RN, non stress was reassuring for gestational age. Continue with daily non stress tests while inpatient. Will need a follow up growth u/s for next week. Pt strongly considering BTL. Will try and sign papers while inpatient *Anemia: recommend consideration for anemia panel and if ferritin is low then for IV fereheme x 2 *SW: patient states looking at potential discharge in about 3 weeks and SW is following. Pt encouraged to call sites given to her from SW.   Durene Romans MD Attending Center for [name])  [facility] Consult Phone [phone]: [phone] Off Hours: [phone]

## 2018-07-15 LAB — BASIC METABOLIC PANEL
ANION GAP: 8 (ref 5–15)
BUN: 7 mg/dL (ref 6–20)
CHLORIDE: 98 mmol/L (ref 98–111)
CO2: 27 mmol/L (ref 22–32)
Calcium: 8.5 mg/dL — ABNORMAL LOW (ref 8.9–10.3)
Creatinine, Ser: 0.45 mg/dL (ref 0.44–1.00)
GFR calc Af Amer: >60 mL/min (ref >60)
GFR calc non Af Amer: >60 mL/min (ref >60)
Glucose, Bld: 78 mg/dL (ref 70–99)
Potassium: 3.9 mmol/L (ref 3.5–5.1)
Sodium: 133 mmol/L — ABNORMAL LOW (ref 135–145)

## 2018-07-15 LAB — CBC
HCT: 28 % — ABNORMAL LOW (ref 36.0–46.0)
HEMOGLOBIN: 8.5 g/dL — AB (ref 12.0–15.0)
MCH: 27.4 pg (ref 26.0–34.0)
MCHC: 30.4 g/dL (ref 30.0–36.0)
MCV: 90.3 fL (ref 80.0–100.0)
Platelets: 367 10*3/uL (ref 150–400)
RBC: 3.1 MIL/uL — AB (ref 3.87–5.11)
RDW: 17.8 % — AB (ref 11.5–15.5)
WBC: 6.5 10*3/uL (ref 4.0–10.5)
nRBC: 0 % (ref 0.0–0.2)

## 2018-07-15 LAB — VANCOMYCIN, PEAK
Vancomycin Pk: 13 ug/mL — ABNORMAL LOW (ref 30–40)
Vancomycin Pk: 26 ug/mL — ABNORMAL LOW (ref 30–40)

## 2018-07-15 MED ORDER — ALPRAZOLAM 0.5 MG PO TABS: 0.5000 mg | ORAL_TABLET | Freq: Three times a day (TID) | ORAL | Status: DC | PRN

## 2018-07-15 MED ORDER — DIPHENHYDRAMINE HCL 25 MG PO CAPS
25.0000 mg | ORAL_CAPSULE | Freq: Four times a day (QID) | ORAL | Status: DC | PRN
  Administered 2018-07-18 (×2): 25 mg via ORAL
  Administered 2018-07-19 – 2018-07-21 (×7): 50 mg via ORAL
  Administered 2018-07-22: 25 mg via ORAL
  Administered 2018-07-22: 50 mg via ORAL
  Administered 2018-07-22: 25 mg via ORAL
  Administered 2018-07-23: 50 mg via ORAL
  Administered 2018-07-23 (×2): 25 mg via ORAL
  Administered 2018-07-24: 50 mg via ORAL
  Administered 2018-07-24: 25 mg via ORAL
  Administered 2018-07-24 – 2018-07-25 (×4): 50 mg via ORAL
  Administered 2018-07-26: 25 mg via ORAL
  Administered 2018-07-26 – 2018-08-02 (×19): 50 mg via ORAL
  Administered 2018-08-03 – 2018-08-04 (×5): 25 mg via ORAL
  Filled 2018-07-15 (×3): qty 2
  Filled 2018-07-15: qty 1
  Filled 2018-07-15: qty 2
  Filled 2018-07-15 (×2): qty 1
  Filled 2018-07-15 (×6): qty 2
  Filled 2018-07-15 (×2): qty 1
  Filled 2018-07-15 (×4): qty 2
  Filled 2018-07-15 (×2): qty 1
  Filled 2018-07-15 (×8): qty 2
  Filled 2018-07-15: qty 1
  Filled 2018-07-15 (×2): qty 2
  Filled 2018-07-15: qty 1
  Filled 2018-07-15 (×4): qty 2
  Filled 2018-07-15 (×2): qty 1
  Filled 2018-07-15 (×4): qty 2
  Filled 2018-07-15 (×2): qty 1
  Filled 2018-07-15 (×4): qty 2

## 2018-07-15 MED ORDER — CHLORHEXIDINE GLUCONATE 0.12 % MT SOLN
15.0000 mL | Freq: Two times a day (BID) | OROMUCOSAL | Status: DC
  Administered 2018-07-15 – 2018-08-04 (×39): 15 mL via OROMUCOSAL
  Filled 2018-07-15 (×35): qty 15

## 2018-07-15 NOTE — Progress Notes (Signed)
POST OPERATIVE NOTE:  07/15/2018 [place] [id number]  VITALS: BP (!) 136/97   Pulse 89   Temp (!) 97.4 F (36.3 C) (Oral)   Resp 18   Ht 5' 7"  (1.702 m)   Wt 81.9 kg   LMP 12/30/2017 (Within Weeks)   SpO2 95%   BMI 28.29 kg/m   LABS:  Lab Results  Component Value Date   WBC 6.5 07/15/2018   HGB 8.5 (L) 07/15/2018   HCT 28.0 (L) 07/15/2018   MCV 90.3 07/15/2018   PLT 367 07/15/2018   BMET    Component Value Date/Time   NA 133 (L) 07/15/2018 0444   NA 136 07/02/2013 0547   K 3.9 07/15/2018 0444   K 3.7 07/02/2013 0547   CL 98 07/15/2018 0444   CL 102 07/02/2013 0547   CO2 27 07/15/2018 0444   CO2 30 07/02/2013 0547   GLUCOSE 78 07/15/2018 0444   GLUCOSE 113 (H) 07/02/2013 0547   BUN 7 07/15/2018 0444   BUN 7 07/02/2013 0547   CREATININE 0.45 07/15/2018 0444   CREATININE 0.82 07/02/2013 0547   CALCIUM 8.5 (L) 07/15/2018 0444   CALCIUM 8.8 07/02/2013 0547   GFRNONAA >60 07/15/2018 0444   GFRNONAA >60 07/02/2013 0547   GFRAA >60 07/15/2018 0444   GFRAA >60 07/02/2013 0547    Lab Results  Component Value Date   INR 0.99 06/14/2018   INR 1.0 06/28/2013   No results found for: PTT   Mekaila N Santoyo is status post extraction tooth numbers 30 and 31 with alveoloplasty on 07-03-2018.  SUBJECTIVE: Patient denies any significant problems associated with dental extraction sites.  Sutures are gone.  EXAM: There is no sign of infection, heme, or ooze.  No sutures remain.  Area is healing in by generalized primary closure but several sites healing in by secondary intention.  ASSESSMENT: Post operative course is consistent with dental procedures performed in the operating room. Loss of teeth due to extraction  PLAN: 1.  Brush teeth after meals and at bedtime. 2.  Use chlorhexidine rinses after breakfast and at bedtime. 3.  Patient to follow-up with the general dentist of her choice for continued dental care and evaluation for additional treatment as  indicated.   4.  Patient will require antibiotic premedication prior to invasive dental procedures due to previous endocarditis per American Heart Association guidelines.   Lenn Cal, DDS

## 2018-07-15 NOTE — Progress Notes (Signed)
PROGRESS NOTE    Kelly [name]  LYY:[id number] DOB: June 30, 1985 DOA: 06/14/2018 PCP: [facility]    Brief Narrative:  33 year old female who presents with low back pain, cough and malaise. 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 Patient reported 7 days of worsening back pain, associated with particular cough, occasional dyspnea and generalized malaise. Apparently she has been using IV drugs. She has bee pain OB for a current pregnancy. On her initial physical examination she was tachypneic 30 because per minute, tachycardic 120 bpm, afebrile, oxygen saturation 90%, she had moist mucous membranes, lungs clear to auscultation bilaterally, heart S1-S2 present rhythmic, abdomen gravid uterus, positive bilateral pitting lower extremity edema. Her chest x-ray showed bilateral multilobar opacities.  She was admitted to the hospital with working diagnosis of multilobar bilateral pneumonia  Assessment & Plan:   Principal Problem:   Bacteremia due to Enterococcus Active Problems:   Opioid use disorder, severe, dependence (HCC)   Sepsis due to pneumonia ([place])   IV drug abuse complicating pregnancy ([place])   Multifocal pneumonia   Hyponatremia   Elevated transaminase level   Degenerative arthritis of lumbar spine   History of bacterial endocarditis   Severe tricuspid regurgitation by prior echocardiogram   Influenza B   Rubella non-immune status, antepartum   High-risk pregnancy in second trimester   Preexisting hypertension complicating pregnancy, antepartum   Dental caries   Phobia of dental procedure   Endocarditis  1. Tricuspid valve endocarditis, with enterococcus and streptococcus bacteremia, complicated with septic emboli to the lungs, multifocal pneumonia.(plan to complete therapy 08/03/2018.)Tolerating well antibiotic therapy  withIVvancomycin, ampicillin and ceftriaxone.  2, Opioid use disorder.Continue with methadone, for pain control. Seems to be well controlled. Out of bed as tolerated. Not able to to do benzodiazepines, will continue with benadryl.   3. Pregnancy.In-hospitalOBfollow per protocol.   4. GERD. Improved dyspepsia withpantoprazole.   5. Constipation. will continue, dulcolax, and miralax.  DVT prophylaxis:enoxaparin Code Status:full Family Communication:no family at the bedside Disposition Plan/ discharge barriers:pending completion of antibiotic therapy    Body mass index is 28.29 kg/m. Malnutrition Type:      Malnutrition Characteristics:      Nutrition Interventions:     RN Pressure Injury Documentation:     Procedures: 07/03/18 1. Multiple extraction of tooth numbers 30 and 31 2. One quadrants of alveoloplasty     Subjective: Patient continue to have constipation, with only mild improvement. Positive anxiety and insomnia. No nausea or vomiting, no chest pain or dyspnea.   Objective: Vitals:   07/14/18 0753 07/14/18 2323 07/15/18 0500 07/15/18 0812  BP: 117/81 120/87  (!) 136/97  Pulse: 83 93  89  Resp:      Temp: (!) 97.5 F (36.4 C) 98 F (36.7 C)  (!) 97.4 F (36.3 C)  TempSrc:  Oral  Oral  SpO2: 96% 94%  95%  Weight:   81.9 kg   Height:        Intake/Output Summary (Last 24 hours) at 07/15/2018 0957 Last data filed at 07/15/2018 0556 Gross per 24 hour  Intake 918 ml  Output -  Net 918 ml   Filed Weights   07/05/18 0500 07/08/18 0425 07/15/18 0500  Weight: 89.3 kg 90 kg 81.9 kg    Examination:   General: deconditioned  Neurology: Awake and alert, non focal  E ENT: mild pallor, no icterus, oral mucosa moist Cardiovascular: No  JVD. S1-S2 present, rhythmic, no gallops, rubs, or murmurs. No lower extremity edema. Pulmonary: positive breath sounds bilaterally, adequate air movement, no wheezing, rhonchi or  rales. Gastrointestinal. Abdomen with no organomegaly, non tender, no rebound or guarding Skin. No rashes Musculoskeletal: no joint deformities     Data Reviewed: I have personally reviewed following labs and imaging studies  CBC: Recent Labs  Lab 07/10/18 0650 07/10/18 0835 07/15/18 0444  WBC 6.6 6.9 6.5  NEUTROABS 4.5 4.9  --   HGB 7.3* 7.6* 8.5*  HCT 24.7* 24.9* 28.0*  MCV 92.9 92.2 90.3  PLT 461* 458* 037   Basic Metabolic Panel: Recent Labs  Lab 07/10/18 0650 07/10/18 0835 07/15/18 0444  NA 122* 135 133*  K 3.2* 3.9 3.9  CL 91* 103 98  CO2 22 26 27   GLUCOSE 549* 76 78  BUN 8 8 7   CREATININE 0.60 0.44 0.45  CALCIUM 7.0* 8.0* 8.5*   GFR: Estimated Creatinine Clearance: 110.1 mL/min (by C-G formula based on SCr of 0.45 mg/dL). Liver Function Tests: No results for input(s): AST, ALT, ALKPHOS, BILITOT, PROT, ALBUMIN in the last 168 hours. No results for input(s): LIPASE, AMYLASE in the last 168 hours. No results for input(s): AMMONIA in the last 168 hours. Coagulation Profile: No results for input(s): INR, PROTIME in the last 168 hours. Cardiac Enzymes: No results for input(s): CKTOTAL, CKMB, CKMBINDEX, TROPONINI in the last 168 hours. BNP (last 3 results) No results for input(s): PROBNP in the last 8760 hours. HbA1C: No results for input(s): HGBA1C in the last 72 hours. CBG: No results for input(s): GLUCAP in the last 168 hours. Lipid Profile: No results for input(s): CHOL, HDL, LDLCALC, TRIG, CHOLHDL, LDLDIRECT in the last 72 hours. Thyroid Function Tests: No results for input(s): TSH, T4TOTAL, FREET4, T3FREE, THYROIDAB in the last 72 hours. Anemia Panel: No results for input(s): VITAMINB12, FOLATE, FERRITIN, TIBC, IRON, RETICCTPCT in the last 72 hours.    Radiology Studies: I have reviewed all of the imaging during this hospital visit personally     Scheduled Meds: . enoxaparin (LOVENOX) injection  40 mg Subcutaneous Q24H  . feeding supplement  (ENSURE ENLIVE)  237 mL Oral BID BM  . methadone  40 mg Oral q1800  . methadone  60 mg Oral Daily  . pantoprazole  40 mg Oral BID  . prenatal multivitamin  1 tablet Oral Q1200  . sodium chloride flush  10-40 mL Intracatheter Q12H  . sodium chloride flush  3 mL Intravenous Q12H   Continuous Infusions: . ampicillin (OMNIPEN) IV 2 g (07/15/18 0758)  . cefTRIAXone (ROCEPHIN)  IV 2 g (07/15/18 0000)  . lactated ringers 10 mL/hr at 07/03/18 2101  . vancomycin 1,000 mg (07/15/18 0556)     LOS: 30 days         Gerome Apley, MD

## 2018-07-15 NOTE — Progress Notes (Signed)
Pharmacy Antibiotic Note  Kelly [name] is a 33 y.o. female admitted on 06/14/2018 with Strep and Enterococcal endocarditis.  Patient continues on  IV vancomycin, ampicillin and ceftriaxone for bacteremia due to Enterococcus and Streptococcus- bacterial endocarditis tricuspid valve, multifocal pneumonia.   Vancomycin needed as Strep sanguinis species resulted as resistant to ceftriaxone and penicillin.  These three IV antibiotics are to continue x 6 weeks per ID recommendation, through 08/03/18.  Patient to stay inpatient for duration of IV antibiotic therapy.     Weekly Vancomycin levels ordered for this morning.  Level labeled as peak at 0444 is actually a trough level and was drawn with AM labs.  5am dose given ~6am. Had intended peak after 5am dose and trough prior to 1pm dose.   Peak level drawn ~2 hrs after am dose infused is 26 mcg/ml.   AUC 535, consistent with last week's levels.    Afebrile, WBC 6.5, and renal function stable.   Plan:  Continue Vancomycin 1gm IV q8h  Continue Ampicillin 2gm IV q4h and Rocephin 2gm IV q12h per MD   Goal AUC 400-550  Antibiotics thru 08/03/18  Vanc levels at least weekly.  Bmet and CBC at least weekly   Height: 5' 7"  (170.2 cm) Weight: 180 lb 9.6 oz (81.9 kg) IBW/kg (Calculated) : 61.6  Temp (24hrs), Avg:97.7 F (36.5 C), Min:97.4 F (36.3 C), Max:98 F (36.7 C)  Recent Labs  Lab 07/10/18 0650 07/10/18 0835 07/15/18 0444 07/15/18 0900  WBC 6.6 6.9 6.5  --   CREATININE 0.60 0.44 0.45  --   VANCOPEAK  --   --  13* 26*    Estimated Creatinine Clearance: 110.1 mL/min (by C-G formula based on SCr of 0.45 mg/dL).    Allergies  Allergen Reactions  . Hydrocodone Itching  . Morphine And Related Nausea And Vomiting    Azithromycin 2/1>>2/2 Vancomcyin 2/1>>2/2, 2/5>>(3/22) Ceftriaxone 2/1>>(3/22) Ampicillin 2/4>>(3/22) Tamiflu 2/3>>2/7  2/6: Levels 19/11 (5 hours apart) -  on 1gm q8h - AUC low at 389 on this dose >> dose adjusted  to 2g IV q12h for estimated AUC 517 2/9: Levels 39/9 (12 hrs apart) - on Vanc 2gm IV q12h - >AUC supratherapeutic at 597 (goal 400-550) > adjusted to 1250 mg IV q8h for estimated AUC 556 - Calculated Ke 0.2185, half-life 3.2 hrs 2/12: VP/VT 30/14, AUC 577 on 1243m q8 >> 1g q8 2/17: VP/VT 19/13, AUC 505.6 on [id number], ke 0.1198, t1/2 5.8h - cont 1000 mg q8h 2/25: VP/VT 29/13, AUC 531.8 on 1000 mg q8h, ke 0.138, t1/2 5.0h-continue 1000 mg q8h 3/3: VP 26/VT 13, AUC 535 on 1gm IV q8h - Ke 0.146, t 1/2 4.7 hrs. - continue 1gm q8h   2/1 Blood x 2: Enterococcus faecalis (pan-S), Strep sanguinis 1/2 (R-PCN and CTX, S-Vanc) and strep mitis/oralis (R-tetra, I-PCN, S-Vanc/Cl[place]/CTX) 2/1 Urine: E coli - sensitive to cefazolin, ceftriaxone; R - amp, cipro, septra, unasyn, zosyn 2/3 Influenza PCR - positive B 2/3 Blood x 2: negative 2/7 RPR negative 2/7 HIV non-reactive 2/7 Rubella < 0.90 = non-immune 2/13 urine - negative 2/20 MRSA PCR: negative  Thank you for allowing pharmacy to be part of this patients care team.  EgArty BaumgartnerRPHullager: [phone] phone: [phone]/07/2018 10:30 AM

## 2018-07-15 NOTE — Progress Notes (Signed)
1510 Daily NST was completed and is appropriate for GA.  Pt reports fetal movement and denies UC's, vaginal bleeding or LOF. 8 Printed EFM tracing reviewed by Dr. Nehemiah Settle of FP.

## 2018-07-16 ENCOUNTER — Encounter: Payer: Self-pay | Admitting: Obstetrics and Gynecology

## 2018-07-16 DIAGNOSIS — O0992 Supervision of high risk pregnancy, unspecified, second trimester: Secondary | ICD-10-CM

## 2018-07-16 DIAGNOSIS — Z98891 History of uterine scar from previous surgery: Secondary | ICD-10-CM

## 2018-07-16 NOTE — Plan of Care (Signed)
  Problem: Education: Goal: Knowledge of General Education information will improve Description Including pain rating scale, medication(s)/side effects and non-pharmacologic comfort measures Outcome: Progressing   Problem: Health Behavior/Discharge Planning: Goal: Ability to manage health-related needs will improve Outcome: Progressing   Problem: Clinical Measurements: Goal: Diagnostic test results will improve Outcome: Progressing

## 2018-07-16 NOTE — Progress Notes (Signed)
[facility] PROGRESS NOTE  Kelly [name] YFR:[id number] DOB: 04/15/1986 DOA: 06/14/2018 PCP: [facility]  Assessment/Plan: 1. 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  Continue 6 weeks of IV ceftriaxone, IV ampicillin, IV vancomycin, end date 3/22 per ID recommendations, follow-up with ID arranged 2. Current pregnancy, monitored by obstetric RN with daily fetal NST, fetal heart rate appropriate 3/4 reported movement.  Continue to closely monitor.  Estimated date of delivery 11/01/2018, Plan to initiate routine monitoring once which is viability in 24 weeks, OB recommends avoiding treating BP in pregnancy is consistently greater than 150/100.  Continue prenatal vitamins 3. Status post teeth extraction with alveoloplasty on 07/03/2018 by dentistry.  Healing well per their evaluation postoperatively 3/3.  In future will need antibiotic medication prior to invasive dental procedures given previous endocarditis. 4. IV heroin/amphetamine abuse.  Continue methadone  Code Status: Full code Family Communication: No family at bedside  Disposition Plan: Continue IV antibiotics as mentioned above, closely monitor (   Consultants:  Dentistry, ID, OB/GYN  Procedures: Procedures: 07/03/18 1. Multiple extraction of tooth numbers30 and 31 2.Onequadrants of alveoloplasty Antibiotics:  IV ceftriaxone  IV vancomycin  IV ampicillin  HPI/Subjective:  Kelly [name] is a 33 y.o. year old female with medical history significant for IV heroin and amphetamine abuse complicated by tricuspid valve endocarditis secondary to MSSA complicated by epidural abscess status post drainage and laminectomy approximately 1 year ago who was admitted on 06/14/2018 and found to have bacteremia secondary to tricuspid valve endocarditis with enterococcus, Streptococcus mitis and cyanosis and to be pregnant..  Doing well this  morning Denies any current complaints  Objective: Vitals:   07/16/18 0822 07/16/18 1500  BP: (!) 134/99 (!) 127/92  Pulse: 89 90  Resp:    Temp: (!) 97.5 F (36.4 C) 98 F (36.7 C)  SpO2: 100% 100%    Intake/Output Summary (Last 24 hours) at 07/16/2018 2141 Last data filed at 07/16/2018 1700 Gross per 24 hour  Intake 2883.63 ml  Output -  Net 2883.63 ml   Filed Weights   07/08/18 0425 07/15/18 0500 07/16/18 0500  Weight: 90 kg 81.9 kg 82.5 kg    Exam:   General: Lying in bed in no distress  Cardiovascular: Regular rate and rhythm, loud SEM heard, no edema  Respiratory: Normal respiratory effort on room air  Abdomen: Pregnant, nontender  Musculoskeletal: Normal range of motion  Skin no new rashes or lesions  Neurologic alert and oriented x4, no appreciable focal deficits  Data Reviewed: Basic Metabolic Panel: Recent Labs  Lab 07/10/18 0650 07/10/18 0835 07/15/18 0444  NA 122* 135 133*  K 3.2* 3.9 3.9  CL 91* 103 98  CO2 22 26 27   GLUCOSE 549* 76 78  BUN 8 8 7   CREATININE 0.60 0.44 0.45  CALCIUM 7.0* 8.0* 8.5*   Liver Function Tests: No results for input(s): AST, ALT, ALKPHOS, BILITOT, PROT, ALBUMIN in the last 168 hours. No results for input(s): LIPASE, AMYLASE in the last 168 hours. No results for input(s): AMMONIA in the last 168 hours. CBC: Recent Labs  Lab 07/10/18 0650 07/10/18 0835 07/15/18 0444  WBC 6.6 6.9 6.5  NEUTROABS 4.5 4.9  --   HGB 7.3* 7.6* 8.5*  HCT 24.7* 24.9* 28.0*  MCV 92.9 92.2 90.3  PLT 461* 458* 367   Cardiac Enzymes: No results for input(s): CKTOTAL, CKMB, CKMBINDEX, TROPONINI in the last 168 hours. BNP (last 3 results) No results for input(s): BNP  in the last 8760 hours.  ProBNP (last 3 results) No results for input(s): PROBNP in the last 8760 hours.  CBG: No results for input(s): GLUCAP in the last 168 hours.  No results found for this or any previous visit (from the past 240 hour(s)).   Studies: No  results found.  Scheduled Meds: . chlorhexidine  15 mL Mouth/Throat BID  . enoxaparin (LOVENOX) injection  40 mg Subcutaneous Q24H  . feeding supplement (ENSURE ENLIVE)  237 mL Oral BID BM  . methadone  40 mg Oral q1800  . methadone  60 mg Oral Daily  . pantoprazole  40 mg Oral BID  . prenatal multivitamin  1 tablet Oral Q1200  . sodium chloride flush  10-40 mL Intracatheter Q12H  . sodium chloride flush  3 mL Intravenous Q12H   Continuous Infusions: . ampicillin (OMNIPEN) IV 2 g (07/16/18 1926)  . cefTRIAXone (ROCEPHIN)  IV Stopped (07/16/18 1100)  . lactated ringers 10 mL/hr at 07/03/18 2101  . vancomycin 1,000 mg (07/16/18 2138)    Principal Problem:   Bacteremia due to Enterococcus Active Problems:   Opioid use disorder, severe, dependence ([place])   Sepsis due to pneumonia ([place])   IV drug abuse complicating pregnancy ([place])   Multifocal pneumonia   Hyponatremia   Elevated transaminase level   Degenerative arthritis of lumbar spine   History of bacterial endocarditis   Severe tricuspid regurgitation by prior echocardiogram   Influenza B   Rubella non-immune status, antepartum   High-risk pregnancy in second trimester   Preexisting hypertension complicating pregnancy, antepartum   Dental caries   Phobia of dental procedure   Endocarditis      [name]  [facility]

## 2018-07-16 NOTE — Progress Notes (Signed)
OB Note  BTL papers signed today after d/w pt, again, re: r/b/a.  Copy of paperwork given to patient. Will scan in other copy into "media" tab  Durene Romans MD [facility]ending [facility] [employer] (Faculty Practice) 07/16/2018 Time: 1239pm

## 2018-07-16 NOTE — Progress Notes (Signed)
1805 Daily fetal NST completed.  FHR appropriate for GA.  No UC's noted. Pt denies vaginal bleeding or LOF.  Pt reports fetal movement.

## 2018-07-17 DIAGNOSIS — K029 Dental caries, unspecified: Secondary | ICD-10-CM

## 2018-07-17 NOTE — Progress Notes (Addendum)
NST reactive for 24 5/[redacted] week gestation with prolonged spesis and polysubstance abuse.  Pt reports good fetal movement.  No leaking or bleeding.  Continue current POC of daily NST's.

## 2018-07-17 NOTE — Progress Notes (Signed)
[facility] PROGRESS NOTE  Kelly [name]:[id number] DOB: 09/16/1985 DOA: 06/14/2018 PCP: [facility]  Assessment/Plan: 1. 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  Continue 6 weeks of IV ceftriaxone, IV ampicillin, IV vancomycin, end date 3/22 per ID recommendations, follow-up with ID arranged  2. Current pregnancy, monitored by obstetric RN with daily fetal NST,   Continue to closely monitor.  Estimated date of delivery 11/01/2018, Plan to initiate routine monitoring once which is viability in 24 weeks, OB recommends avoiding treating BP in pregnancy is consistently greater than 150/100.  Continue prenatal vitamins  3. Status post teeth extraction with alveoloplasty on 07/03/2018 by dentistry.  Healing well per their evaluation postoperatively 3/3.  In future will need antibiotic medication prior to invasive dental procedures given previous endocarditis.  4. IV heroin/amphetamine abuse.  Continue methadone  Code Status: Full code Family Communication: No family at bedside  Disposition Plan: Continue IV antibiotics as mentioned above, closely monitor    Consultants:  Dentistry, ID, OB/GYN  Procedures: Procedures: 07/03/18 1. Multiple extraction of tooth numbers30 and 31 2.Onequadrants of alveoloplasty Antibiotics:  IV ceftriaxone  IV vancomycin  IV ampicillin  HPI/Subjective:  Kelly [name] is a 33 y.o. 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  Has no complaints today No acute events overnight No chest pain, normal  breathing  Objective: Vitals:   07/16/18 2354 07/17/18 0847  BP: 127/79 (!) 137/94  Pulse: 86 85  Resp:    Temp: 97.9 F (36.6 C) (!) 97.5 F (36.4 C)  SpO2: 96% 95%    Intake/Output Summary (Last 24 hours) at 07/17/2018 1618 Last data filed at 07/17/2018 0023 Gross per 24 hour  Intake 2607.63 ml  Output -  Net 2607.63 ml   Filed Weights   07/15/18 0500 07/16/18 0500 07/17/18 0600  Weight: 81.9 kg 82.5 kg 82.5 kg    Exam:   General: Lying in bed in no distress  Cardiovascular: Regular rate and rhythm, loud SEM heard, no edema  Respiratory: Normal respiratory effort on room air  Abdomen: Pregnant, nontender  Musculoskeletal: Normal range of motion  Skin no new rashes or lesions  Neurologic alert and oriented x4, no appreciable focal deficits  Data Reviewed: Basic Metabolic Panel: Recent Labs  Lab 07/15/18 0444  NA 133*  K 3.9  CL 98  CO2 27  GLUCOSE 78  BUN 7  CREATININE 0.45  CALCIUM 8.5*   Liver Function Tests: No results for input(s): AST, ALT, ALKPHOS, BILITOT, PROT, ALBUMIN in the last 168 hours. No results for input(s): LIPASE, AMYLASE in the last 168 hours. No results for input(s): AMMONIA in the last 168 hours. CBC: Recent Labs  Lab 07/15/18 0444  WBC 6.5  HGB 8.5*  HCT 28.0*  MCV 90.3  PLT 367   Cardiac Enzymes: No results for input(s): CKTOTAL, CKMB, CKMBINDEX, TROPONINI in the last 168 hours. BNP (last 3 results) No results for input(s): BNP in the last 8760 hours.  ProBNP (last 3 results) No results for input(s): PROBNP in the last 8760 hours.  CBG: No results for input(s): GLUCAP in the last 168 hours.  No results found for this or any  previous visit (from the past 240 hour(s)).   Studies: No results found.  Scheduled Meds: . chlorhexidine  15 mL Mouth/Throat BID  . enoxaparin (LOVENOX) injection  40 mg Subcutaneous Q24H  . feeding supplement (ENSURE ENLIVE)  237 mL Oral BID BM  . methadone  40 mg Oral q1800  .  methadone  60 mg Oral Daily  . pantoprazole  40 mg Oral BID  . prenatal multivitamin  1 tablet Oral Q1200  . sodium chloride flush  10-40 mL Intracatheter Q12H  . sodium chloride flush  3 mL Intravenous Q12H   Continuous Infusions: . ampicillin (OMNIPEN) IV 2 g (07/17/18 1528)  . cefTRIAXone (ROCEPHIN)  IV 2 g (07/17/18 1134)  . lactated ringers 10 mL/hr at 07/03/18 2101  . vancomycin 1,000 mg (07/17/18 1301)    Principal Problem:   Bacteremia due to Enterococcus Active Problems:   Opioid use disorder, severe, dependence (HCC)   Sepsis due to pneumonia ([place])   IV drug abuse complicating pregnancy ([place])   Multifocal pneumonia   Hyponatremia   Elevated transaminase level   Degenerative arthritis of lumbar spine   History of bacterial endocarditis   Severe tricuspid regurgitation by prior echocardiogram   Influenza B   Rubella non-immune status, antepartum   High-risk pregnancy in second trimester   Preexisting hypertension complicating pregnancy, antepartum   Dental caries   Phobia of dental procedure   Endocarditis      [name]  [facility]

## 2018-07-18 MED ORDER — CLOTRIMAZOLE 2 % VA CREA: 1.0000 | TOPICAL_CREAM | Freq: Every day | VAGINAL | Status: DC

## 2018-07-18 MED ORDER — NYSTATIN 100000 UNIT/GM EX POWD
Freq: Two times a day (BID) | CUTANEOUS | Status: DC
  Administered 2018-07-18 – 2018-08-04 (×34): via TOPICAL
  Filled 2018-07-18 (×3): qty 15

## 2018-07-18 MED ORDER — SODIUM CHLORIDE 0.9 % IV SOLN
INTRAVENOUS | Status: DC | PRN
  Administered 2018-07-18: 250 mL via INTRAVENOUS

## 2018-07-18 NOTE — Progress Notes (Signed)
[facility] PROGRESS NOTE  Kelly [name] XTK:[id number] DOB: 1985-09-25 DOA: 06/14/2018 PCP: [facility]  Assessment/Plan: 1. 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  Continue 6 weeks of IV ceftriaxone, IV ampicillin, IV vancomycin, end date 3/22 per ID recommendations, follow-up with ID arranged  2. Current pregnancy, monitored by obstetric RN with daily fetal NST,   Continue to closely monitor.  Estimated date of delivery 11/01/2018, Plan to initiate routine monitoring once which is viability in 24 weeks, OB recommends avoiding treating BP in pregnancy is consistently greater than 150/100.  Continue prenatal vitamins  3. Status post teeth extraction with alveoloplasty on 07/03/2018 by dentistry.  Healing well per their evaluation postoperatively 3/3.  In future will need antibiotic medication prior to invasive dental procedures given previous endocarditis.  4. IV heroin/amphetamine abuse.  Continue methadone   Code Status: Full code Family Communication: No family at bedside  Disposition Plan: Continue IV antibiotics as mentioned above, closely monitor    Consultants:  Dentistry, ID, OB/GYN  Procedures: Procedures: 07/03/18 1. Multiple extraction of tooth numbers30 and 31 2.Onequadrants of alveoloplasty Antibiotics:  IV ceftriaxone  IV vancomycin  IV ampicillin  HPI/Subjective:  Kelly [name] is a 33 y.o. 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  Has no complaints today No acute events overnight No chest pain,  normal breathing  Objective: Vitals:   07/18/18 0723 07/18/18 1500  BP: 114/83 120/78  Pulse: 81 83  Resp:    Temp: 98.3 F (36.8 C) 98.5 F (36.9 C)  SpO2: 98% 99%   No intake or output data in the 24 hours ending 07/18/18 1810 Filed Weights   07/15/18 0500 07/16/18 0500 07/17/18 0600  Weight: 81.9 kg 82.5 kg 82.5 kg    Exam:   General: Lying in bed in no distress  Cardiovascular: Regular rate and rhythm, loud SEM heard, no edema  Respiratory: Normal respiratory effort on room air  Abdomen: Pregnant, nontender  Musculoskeletal: Normal range of motion  Skin no new rashes or lesions  Neurologic alert and oriented x4, no appreciable focal deficits  Data Reviewed: Basic Metabolic Panel: Recent Labs  Lab 07/15/18 0444  NA 133*  K 3.9  CL 98  CO2 27  GLUCOSE 78  BUN 7  CREATININE 0.45  CALCIUM 8.5*   Liver Function Tests: No results for input(s): AST, ALT, ALKPHOS, BILITOT, PROT, ALBUMIN in the last 168 hours. No results for input(s): LIPASE, AMYLASE in the last 168 hours. No results for input(s): AMMONIA in the last 168 hours. CBC: Recent Labs  Lab 07/15/18 0444  WBC 6.5  HGB 8.5*  HCT 28.0*  MCV 90.3  PLT 367   Cardiac Enzymes: No results for input(s): CKTOTAL, CKMB, CKMBINDEX, TROPONINI in the last 168 hours. BNP (last 3 results) No results for input(s): BNP in the last 8760 hours.  ProBNP (last 3 results) No results for input(s): PROBNP in the last 8760 hours.  CBG: No results for input(s): GLUCAP in the last 168 hours.  No results found for this or any previous visit (from the past 240 hour(s)).   Studies: No results found.  Scheduled Meds: . chlorhexidine  15 mL  Mouth/Throat BID  . enoxaparin (LOVENOX) injection  40 mg Subcutaneous Q24H  . feeding supplement (ENSURE ENLIVE)  237 mL Oral BID BM  . methadone  40 mg Oral q1800  . methadone  60 mg Oral Daily  . nystatin   Topical BID  . pantoprazole  40 mg Oral BID  . prenatal  multivitamin  1 tablet Oral Q1200  . sodium chloride flush  10-40 mL Intracatheter Q12H   Continuous Infusions: . ampicillin (OMNIPEN) IV 2 g (07/18/18 1700)  . cefTRIAXone (ROCEPHIN)  IV 2 g (07/18/18 0957)  . lactated ringers 10 mL/hr at 07/03/18 2101  . vancomycin 1,000 mg (07/18/18 1243)    Principal Problem:   Bacteremia due to Enterococcus Active Problems:   Opioid use disorder, severe, dependence ([place])   Sepsis due to pneumonia ([place])   IV drug abuse complicating pregnancy ([place])   Multifocal pneumonia   Hyponatremia   Elevated transaminase level   Degenerative arthritis of lumbar spine   History of bacterial endocarditis   Severe tricuspid regurgitation by prior echocardiogram   Influenza B   Rubella non-immune status, antepartum   High-risk pregnancy in second trimester   Preexisting hypertension complicating pregnancy, antepartum   Dental caries   Phobia of dental procedure   Endocarditis      Kelly [name]  [facility]

## 2018-07-19 NOTE — Progress Notes (Signed)
FHR 145 BPM, min- mod variability, 10x10 accels, no decels, no uc's. Pt denies vaginal bleeding or leaking of fluid.

## 2018-07-19 NOTE — Progress Notes (Signed)
Pharmacy Antibiotic Note  Kelly [name] is a 33 y.o. female admitted on 06/14/2018 with Strep and Enterococcal endocarditis.  Patient continues on  IV vancomycin, ampicillin and ceftriaxone for bacteremia due to Enterococcus and Streptococcus- bacterial endocarditis tricuspid valve, multifocal pneumonia.   Vancomycin needed as Strep sanguinis species resulted as resistant to ceftriaxone and penicillin.  These three IV antibiotics are to continue x 6 weeks per ID recommendation, through 08/03/18.  Patient to stay inpatient for duration of IV antibiotic therapy.   Following weekly Vancomycin levels, and BMET next due 07/22/18.  Afebrile, WBC 6.5, SCr 0.45 stable on 07/15/18.   Plan:  Continue Vancomycin 1gm IV q8h  Continue Ampicillin 2gm IV q4h and Rocephin 2gm IV q12h per MD   Goal AUC 400-550  Antibiotics thru 08/03/18  Vanc levels at least weekly.  Bmet and CBC at least weekly   Height: 5' 7"  (170.2 cm) Weight: 182 lb (82.6 kg) IBW/kg (Calculated) : 61.6  Temp (24hrs), Avg:97.7 F (36.5 C), Min:97.5 F (36.4 C), Max:97.8 F (36.6 C)  Recent Labs  Lab 07/15/18 0444 07/15/18 0900  WBC 6.5  --   CREATININE 0.45  --   VANCOPEAK 13* 26*    Estimated Creatinine Clearance: 110.5 mL/min (by C-G formula based on SCr of 0.45 mg/dL).    Allergies  Allergen Reactions  . Hydrocodone Itching  . Morphine And Related Nausea And Vomiting    Azithromycin 2/1>>2/2 Vancomcyin 2/1>>2/2, 2/5>>(3/22) Ceftriaxone 2/1>>(3/22) Ampicillin 2/4>>(3/22) Tamiflu 2/3>>2/7  2/6: Levels 19/11 (5 hours apart) -  on 1gm q8h - AUC low at 389 on this dose >> dose adjusted to 2g IV q12h for estimated AUC 517 2/9: Levels 39/9 (12 hrs apart) - on Vanc 2gm IV q12h - >AUC supratherapeutic at 597 (goal 400-550) > adjusted to 1250 mg IV q8h for estimated AUC 556 - Calculated Ke 0.2185, half-life 3.2 hrs 2/12: VP/VT 30/14, AUC 577 on 1210m q8 >> 1g q8 2/17: VP/VT 19/13, AUC 505.6 on [id number], ke 0.1198,  t1/2 5.8h - cont 1000 mg q8h 2/25: VP/VT 29/13, AUC 531.8 on 1000 mg q8h, ke 0.138, t1/2 5.0h-continue 1000 mg q8h 3/3: VP 26/VT 13, AUC 535 on 1gm IV q8h - Ke 0.146, t 1/2 4.7 hrs. - continue 1gm q8h   2/1 Blood x 2: Enterococcus faecalis (pan-S), Strep sanguinis 1/2 (R-PCN and CTX, S-Vanc) and strep mitis/oralis (R-tetra, I-PCN, S-Vanc/[place]/CTX) 2/1 Urine: E coli - sensitive to cefazolin, ceftriaxone; R - amp, cipro, septra, unasyn, zosyn 2/3 Influenza PCR - positive B 2/3 Blood x 2: negative 2/7 RPR negative 2/7 HIV non-reactive 2/7 Rubella < 0.90 = non-immune 2/13 urine - negative 2/20 MRSA PCR: negative  Thank you for allowing pharmacy to be part of this patients care team.  RuNicole CellaRPValley Centerhone: [phone] check AMION for all MCGenevahone numbers After 10:00 PM, call [phone]/11/2018 4:00 PM

## 2018-07-19 NOTE — Progress Notes (Signed)
[facility] PROGRESS NOTE  KATERA RYBKA QBH:[id number] DOB: [birth year] DOA: 06/14/2018 PCP: [facility]  Assessment/Plan: 1. 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  Continue 6 weeks of IV ceftriaxone, IV ampicillin, IV vancomycin, end date 3/22 per ID recommendations, follow-up with ID arranged  2. Current pregnancy, monitored by obstetric RN with daily fetal NST,   Continue to closely monitor.  Estimated date of delivery 11/01/2018, Plan to initiate routine monitoring once which is viability in 24 weeks, OB recommends avoiding treating BP in pregnancy is consistently greater than 150/100.  Continue prenatal vitamins  3. Status post teeth extraction with alveoloplasty on 07/03/2018 by dentistry.  Healing well per their evaluation postoperatively 3/3.  In future will need antibiotic medication prior to invasive dental procedures given previous endocarditis.  4. IV heroin/amphetamine abuse.  Continue methadone   Code Status: Full code Family Communication: No family at bedside  Disposition Plan: Continue IV antibiotics as mentioned above, closely monitor    Consultants:  Dentistry, ID, OB/GYN  Procedures: Procedures: 07/03/18 1. Multiple extraction of tooth numbers30 and 31 2.Onequadrants of alveoloplasty Antibiotics:  IV ceftriaxone  IV vancomycin  IV ampicillin  HPI/Subjective:  Timberlynn CATALEA LABRECQUE is a 33 y.o. 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  Has nausea controlled on PRNs. No abdominal pain, normal appetite,  normal BM, Has some itching with IV infusion but no rash  Objective: Vitals:   07/18/18 2330 07/19/18 0854  BP: 129/88 131/89  Pulse: 84 93  Resp:    Temp: 97.8 F (36.6 C) (!) 97.5 F (36.4 C)  SpO2: 98% 98%    Intake/Output Summary (Last 24 hours) at 07/19/2018 1350 Last data filed at 07/19/2018 0400 Gross per 24 hour  Intake 3831.42 ml  Output -  Net 3831.42 ml   Filed Weights   07/16/18 0500 07/17/18 0600 07/19/18 0500  Weight: 82.5 kg 82.5 kg 82.6 kg    Exam:   General: Lying in bed in no distress  Cardiovascular: Regular rate and rhythm, loud SEM heard, no edema  Respiratory: Normal respiratory effort on room air  Abdomen: Pregnant, nontender  Musculoskeletal: Normal range of motion  Skin no new rashes or lesions  Neurologic alert and oriented x4, no appreciable focal deficits  Data Reviewed: Basic Metabolic Panel: Recent Labs  Lab 07/15/18 0444  NA 133*  K 3.9  CL 98  CO2 27  GLUCOSE 78  BUN 7  CREATININE 0.45  CALCIUM 8.5*   Liver Function Tests: No results for input(s): AST, ALT, ALKPHOS, BILITOT, PROT, ALBUMIN in the last 168 hours. No results for input(s): LIPASE, AMYLASE in the last 168 hours. No results for input(s): AMMONIA in the last 168 hours. CBC: Recent Labs  Lab 07/15/18 0444  WBC 6.5  HGB 8.5*  HCT 28.0*  MCV 90.3  PLT 367   Cardiac Enzymes: No results for input(s): CKTOTAL, CKMB, CKMBINDEX, TROPONINI in the last 168 hours. BNP (last 3 results) No results for input(s): BNP in the last 8760 hours.  ProBNP (last 3 results) No results for input(s): PROBNP in the last 8760 hours.  CBG: No results for input(s): GLUCAP in the last 168 hours.  No results found for this or any previous visit (from the past 240 hour(s)).   Studies: No results found.  Scheduled Meds: . chlorhexidine  15 mL Mouth/Throat BID  . enoxaparin (LOVENOX) injection  40 mg Subcutaneous Q24H  . feeding supplement (ENSURE ENLIVE)  237 mL Oral BID BM   . methadone  40 mg Oral q1800  . methadone  60 mg Oral Daily  . nystatin   Topical BID  . pantoprazole  40 mg Oral BID  . prenatal multivitamin  1 tablet Oral Q1200  . sodium chloride flush  10-40 mL Intracatheter Q12H   Continuous Infusions: . sodium chloride 10 mL/hr at 07/19/18 0400  . ampicillin (OMNIPEN) IV 2 g (07/19/18 1301)  . cefTRIAXone (ROCEPHIN)  IV 2 g (07/19/18 1031)  . lactated ringers 10 mL/hr at 07/03/18 2101  . vancomycin 1,000 mg (07/19/18 1344)    Principal Problem:   Bacteremia due to Enterococcus Active Problems:   Opioid use disorder, severe, dependence (HCC)   Sepsis due to pneumonia ([place])   IV drug abuse complicating pregnancy ([place])   Multifocal pneumonia   Hyponatremia   Elevated transaminase level   Degenerative arthritis of lumbar spine   History of bacterial endocarditis   Severe tricuspid regurgitation by prior echocardiogram   Influenza B   Rubella non-immune status, antepartum   High-risk pregnancy in second trimester   Preexisting hypertension complicating pregnancy, antepartum   Dental caries   Phobia of dental procedure   Endocarditis      [name]  [facility]

## 2018-07-20 DIAGNOSIS — A491 Streptococcal infection, unspecified site: Secondary | ICD-10-CM | POA: Diagnosis present

## 2018-07-20 NOTE — Progress Notes (Signed)
FHR baseline 150 BPM, min-mod variability, 10x10 accels, no decels, no uc's. Pt denies vaginal bleeding or leaking of fluid. No c/o abd cramping. Dr. Roselie Awkward notified.

## 2018-07-20 NOTE — Plan of Care (Signed)
Pt states she walked several laps around the halls during the day yesterday and has ambulated in room this shift.

## 2018-07-20 NOTE — Progress Notes (Addendum)
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  Positive blood cultures on 06/14/2018.  TEE large tricuspid valve vegetation, preserved EF, elevated RVSP, enlarged RV.  Not a surgical candidate per CT surgery evaluation blood cultures on 2/3 have remained negative over 5 days -Continue 6 weeks of IV ceftriaxone, IV ampicillin, IV vancomycin, end date 3/22 per ID recommendations to complete in hospital given recent IV drug use, follow-up with ID arranged  Current pregnancy, monitored by obstetric RN with daily fetal NST,   Continue to closely monitor.  Estimated date of delivery 11/01/2018, Plan to initiate routine monitoring once which is viability in 24 weeks, OB recommends avoiding treating BP in pregnancy is consistently greater than 150/100. -Continue prenatal vitamins - Daily fetal NST by obstetric RN  Dental caries Dental imaging reviewed dental abscess/dental caries prompting s dental extraction with alveoloplasty on 07/03/2018 by dentistry.  Healing well per their evaluation postoperatively 3/3. -In future will need antibiotic medication prior to invasive dental procedures given previous endocarditis.  IV heroin/amphetamine abuse. -Continue methadone  Groin rash, very likely yeast infection, improving -Continue nystatin powder, closely monitor  Sepsis secondary to bacteremia of Streptococcus oralis/mitis and E faecalis secondary to tricuspid valve endocarditis along with influenza B infection, multifocal pneumonia, resolved On presentation was hypotensive, tachypneic, tachycardic, white count of 15.9.Bilateral airspace opacities on admission chest x-ray.  No longer having cough.  Completed antibiotic course with IV ceftriaxone.  Doing well on room air.  Still completing IV  antibiotic course for endocarditis.  Physiologic anemia pregnancy required 2 units transfusion for hemoglobin of 6.8 (2/7).  Has remained stable between 7 and 8. -Repeat CBC on 3/9  Back pain, resolved Given bacteremia and known IV drug use MRI was obtained on admission which was negative for active infection of lumbar spine.  Influenza B, resolved. -Completed course of oseltamivir.  No longer on droplet precautions  Dysuria, resolved Found to have E. coli UTI on 2/1, completed course with IV ceftriaxone.  Most recent urine culture on 2/13- for growth  Code Status: Full code Family Communication: No family at bedside  Disposition Plan: Continue IV antibiotics as mentioned above, closely monitor    Consultants:  Dentistry, ID, OB/GYN  Procedures: Procedures:   2/3 TTE  1. Tricuspid regurgitation severe by color flow Doppler. There is a large mobile tricuspid valve vegetation on the anterior leaflet. The TV vegetation measures 10 mm x 20 mm.  2. The left ventricle has normal systolic function of 62-95%. The cavity size is normal. There is no left ventricular wall thickness. Echo evidence of normal diastolic filling patterns.  3. The right ventricle is moderately enlarged in size. There is normal systolic function. Right ventricular systolic pressure is normal with an estimated pressure of 43.8 mmHg.  4. Mildly dilated left atrial size.  5. Moderately dilated right atrial size.  6. The mitral valve is myxomatous. There is mild thickening. Regurgitation is not visualized by color flow Doppler.  7. Large vegetation on the tricuspid valve.  8. Myxomatous tricuspid valve.  9. Tricuspid regurgitation severe. 10. No atrial level shunt detected by color flow Doppler.  07/03/18 1. Multiple extraction of tooth numbers30 and 31 2.Onequadrants of alveoloplasty Antibiotics:  IV ceftriaxone, 2/1-  IV vancomycin 2/1, 2/5-  IV ampicillin 2/4  Oseltamivir 2/3-  2/7  HPI/Subjective:  Kelly [name] is a 33 y.o. 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  Hospital course: On initial presentation patient was afebrile, tachypneic, tachycardic, with leukocytosis with hypotension consistent with sepsis.  Marland Kitchen  Her hCG was greater than 2000 and ultrasound showed intrauterine gestation with estimated gestational age of [redacted] weeks.  MRI of back was negative for acute infection.  She was initially started on IV vancomycin and Rocephin and azithromycin in the ED before transition to IV Rocephin due to multifocal pneumonia and Tamiflu for influenza B.  IV ampicillin and IV vancomycin were later added was added to this regimen due to E faecalis and Streptococcus mitis/paralysis bacteremia.  CT surgery was consulted due to worsening vegetation on TTE and severe TR patient is not a surgical candidate patient has been on IV ceftriaxone, IV ampicillin, IV vancomycin for MRSA bacterial endocarditis/bacteremia    Still having some nausea but better with PRN's Appetite, normal BMs Still intermittent itching not correlated with antibiotic lesions  Objective: Vitals:   07/19/18 2250 07/20/18 0805  BP: 118/81 113/85  Pulse: 88 88  Resp: 19 16  Temp: 97.8 F (36.6 C) 97.6 F (36.4 C)  SpO2: 96% 98%    Intake/Output Summary (Last 24 hours) at 07/20/2018 1559 Last data filed at 07/20/2018 0555 Gross per 24 hour  Intake 1300.08 ml  Output -  Net 1300.08 ml   Filed Weights   07/16/18 0500 07/17/18 0600 07/19/18 0500  Weight: 82.5 kg  82.5 kg 82.6 kg    Exam:   General: Lying in bed in no distress  Cardiovascular: Regular rate and rhythm, loud SEM heard, no edema  Respiratory: Normal respiratory effort on room air  Abdomen: Pregnant, nontender  Musculoskeletal: Normal range of motion  Skin Red rash in groin area (dry), powder in place  Neurologic alert and oriented x4, no appreciable focal deficits  Data Reviewed: Basic Metabolic Panel: Recent Labs  Lab 07/15/18 0444  NA 133*  K 3.9  CL 98  CO2 27  GLUCOSE 78  BUN 7  CREATININE 0.45  CALCIUM 8.5*   Liver Function Tests: No results for input(s): AST, ALT, ALKPHOS, BILITOT, PROT, ALBUMIN in the last 168 hours. No results for input(s): LIPASE, AMYLASE in the last 168 hours. No results for input(s): AMMONIA in the last 168 hours. CBC: Recent Labs  Lab 07/15/18 0444  WBC 6.5  HGB 8.5*  HCT 28.0*  MCV 90.3  PLT 367   Cardiac Enzymes: No results for input(s): CKTOTAL, CKMB, CKMBINDEX, TROPONINI in the last 168 hours. BNP (last 3 results) No results for input(s): BNP in the last 8760 hours.  ProBNP (last 3 results) No results for input(s): PROBNP in the last 8760 hours.  CBG: No results for input(s): GLUCAP in the last 168 hours.  No results found for this or any previous visit (from the past 240 hour(s)).   Studies: No results found.  Scheduled Meds: . chlorhexidine  15 mL Mouth/Throat BID  . enoxaparin (LOVENOX) injection  40 mg Subcutaneous Q24H  . feeding supplement (ENSURE ENLIVE)  237 mL Oral BID BM  . methadone  40 mg Oral q1800  . methadone  60 mg Oral Daily  .  nystatin   Topical BID  . pantoprazole  40 mg Oral BID  . prenatal multivitamin  1 tablet Oral Q1200  . sodium chloride flush  10-40 mL Intracatheter Q12H   Continuous Infusions: . sodium chloride 10 mL/hr at 07/19/18 0400  . ampicillin (OMNIPEN) IV 2 g (07/20/18 1557)  . cefTRIAXone (ROCEPHIN)  IV 2 g (07/20/18 0943)  . lactated ringers 10 mL/hr at 07/03/18  2101  . vancomycin 1,000 mg (07/20/18 1348)    Principal Problem:   Bacteremia due to Enterococcus Active Problems:   Opioid use disorder, severe, dependence (H[facility])   Sepsis due to pneumonia ([place])   IV drug abuse complicating pregnancy ([place])   Multifocal pneumonia   Hyponatremia   Elevated transaminase level   Degenerative arthritis of lumbar spine   History of bacterial endocarditis   Severe tricuspid regurgitation by prior echocardiogram   Influenza B   Rubella non-immune status, antepartum   High-risk pregnancy in second trimester   Preexisting hypertension complicating pregnancy, antepartum   Dental caries   Phobia of dental procedure   Endocarditis      [name]  [facility]

## 2018-07-21 ENCOUNTER — Inpatient Hospital Stay (HOSPITAL_COMMUNITY): Payer: Medicaid Other

## 2018-07-21 DIAGNOSIS — O99322 Drug use complicating pregnancy, second trimester: Secondary | ICD-10-CM

## 2018-07-21 DIAGNOSIS — Z3A25 25 weeks gestation of pregnancy: Secondary | ICD-10-CM

## 2018-07-21 DIAGNOSIS — O99412 Diseases of the circulatory system complicating pregnancy, second trimester: Secondary | ICD-10-CM

## 2018-07-21 DIAGNOSIS — O99012 Anemia complicating pregnancy, second trimester: Secondary | ICD-10-CM

## 2018-07-21 DIAGNOSIS — O10012 Pre-existing essential hypertension complicating pregnancy, second trimester: Secondary | ICD-10-CM

## 2018-07-21 DIAGNOSIS — Z362 Encounter for other antenatal screening follow-up: Secondary | ICD-10-CM

## 2018-07-21 DIAGNOSIS — O99342 Other mental disorders complicating pregnancy, second trimester: Secondary | ICD-10-CM

## 2018-07-21 LAB — IRON AND TIBC
Iron: 82 ug/dL (ref 28–170)
SATURATION RATIOS: 13 % (ref 10.4–31.8)
TIBC: 634 ug/dL — ABNORMAL HIGH (ref 250–450)
UIBC: 552 ug/dL

## 2018-07-21 LAB — CBC
HCT: 30.4 % — ABNORMAL LOW (ref 36.0–46.0)
Hemoglobin: 9.5 g/dL — ABNORMAL LOW (ref 12.0–15.0)
MCH: 27.9 pg (ref 26.0–34.0)
MCHC: 31.3 g/dL (ref 30.0–36.0)
MCV: 89.4 fL (ref 80.0–100.0)
Platelets: 379 10*3/uL (ref 150–400)
RBC: 3.4 MIL/uL — ABNORMAL LOW (ref 3.87–5.11)
RDW: 17.2 % — AB (ref 11.5–15.5)
WBC: 7.8 10*3/uL (ref 4.0–10.5)
nRBC: 0 % (ref 0.0–0.2)

## 2018-07-21 LAB — FERRITIN: Ferritin: 29 ng/mL (ref 11–307)

## 2018-07-21 NOTE — Plan of Care (Signed)
  Problem: Education: Goal: Knowledge of General Education information will improve Description Including pain rating scale, medication(s)/side effects and non-pharmacologic comfort measures Outcome: Progressing   Problem: Health Behavior/Discharge Planning: Goal: Ability to manage health-related needs will improve Outcome: Progressing   Problem: Clinical Measurements: Goal: Respiratory complications will improve Outcome: Progressing   Problem: Activity: Goal: Risk for activity intolerance will decrease Outcome: Progressing   Problem: Nutrition: Goal: Adequate nutrition will be maintained Outcome: Progressing   Problem: Coping: Goal: Level of anxiety will decrease Outcome: Progressing

## 2018-07-21 NOTE — Progress Notes (Signed)
Patient ID: Kelly [name], female   DOB: 04-20-1986, 33 y.o.   MRN: [id number] Kelly [name]) NOTE  Kelly [name] is a 33 y.o. [id number] at [redacted]w[redacted]d who is admitted for inpatient management of bacterial endocarditis (complication related to IV drug use) .    Fetal presentation is transverse. Length of Stay:  36  Days  Date of admission:06/14/2018  Subjective: Patient is doing well without complaints Patient reports the fetal movement as active. Patient reports uterine contraction  activity as none. Patient reports  vaginal bleeding as none. Patient describes fluid per vagina as None.  Vitals:  Blood pressure 120/82, pulse 89, temperature 97.9 F (36.6 C), temperature source Oral, resp. rate 20, height 5' 7"  (1.702 m), weight 82.6 kg, last menstrual period 12/30/2017, SpO2 96 %, unknown if currently breastfeeding. Vitals:   07/20/18 1612 07/20/18 2255 07/21/18 0524 07/21/18 0749  BP: 121/78 117/75  120/82  Pulse: 90 89  89  Resp: 16   20  Temp: 97.8 F (36.6 C) 98.3 F (36.8 C)  97.9 F (36.6 C)  TempSrc: Oral Oral  Oral  SpO2: 97% 97%  96%  Weight:   82.6 kg   Height:       Physical Examination:  GENERAL: Well-developed, well-nourished female in no acute distress.  LUNGS: Clear to auscultation bilaterally.  HEART: Regular rate and rhythm. ABDOMEN: Soft, nontender, nondistended. No organomegaly. PELVIC: Not performed EXTREMITIES: No cyanosis, clubbing, or edema, 2+ distal pulses.  Fetal Monitoring:  Monitored by Ob rapid reponse, reported as appropriate for gestational age  Labs:  No results found for this or any previous visit (from the past 24 hour(s)).  Imaging Studies:    Dg Orthopantogram  Result Date: 06/25/2018 CLINICAL DATA:  33year old female with a history of abscess EXAM: ORTHOPANTOGRAM/PANORAMIC COMPARISON:  None. FINDINGS: Periapical lucency of the right mandibular last molar tooth. Multiple lucencies of mandibular and maxillary  T compatible with dental caries Dental amalgams. No displaced fracture. IMPRESSION: Periapical lucency the right mandibular last molar tooth compatible with abscess. Dental caries. Electronically Signed   By: JCorrie MckusickD.O.   On: 06/25/2018 19:26   [place] Limited  Result Date: 06/22/2018 CLINICAL DATA:  IV drug use.  Assess viability EXAM: LIMITED OBSTETRIC ULTRASOUND FINDINGS: Number of Fetuses: 1 Heart Rate:  137 bpm Movement: Visualized Presentation: Cephalic Placental Location: Anterior Previa: No Amniotic Fluid (Subjective):  Within normal limits. AFI:  cm BPD: 5.1 cm 21 w  3 d MATERNAL FINDINGS: Cervix:  Appears closed. Uterus/Adnexae: No abnormality visualized. IMPRESSION: Viable 21 week 3 day intrauterine pregnancy. Fetal heart rate 137 beats per minute. No acute maternal findings. This exam is performed on an emergent basis and does not comprehensively evaluate fetal size, dating, or anatomy; follow-up complete OB [place] be considered if further fetal assessment is warranted. Electronically Signed   By: KRolm BaptiseM.D.   On: 06/22/2018 21:26   [place]Mfm Ob Detail +14 Wk  Result Date: 06/24/2018 ----------------------------------------------------------------------  OBSTETRICS REPORT                       (Signed Final 06/24/2018 10:54 am) ---------------------------------------------------------------------- Patient Info  ID #:       [id number]                         D.O.B.:  005-Jun-1987(33 yrs)  Name:       [name]  Visit Date: 06/24/2018 08:50 am ---------------------------------------------------------------------- Performed By  Performed By:     Valda Favia          Ref. Address:     [address]                    [place], [place]  Attending:        Tama High MD         Location:         [name]  Referred By:      Osborne Oman MD ---------------------------------------------------------------------- Orders   #  Description                          Code         Ordered By   1  [place] MFM OB DETAIL +14 WK              75102.58     Verita Schneiders  ----------------------------------------------------------------------   #  Order #                    Accession #                 Episode #   1  [id number]                  [id number]                  [id number]  ---------------------------------------------------------------------- Indications   Encounter for antenatal screening for          Z36.3   malformations   Drug use complicating pregnancy, second        O99.322   trimester (Heroin, methadone currently)   Hypertension - Chronic/Pre-existing            O10.019   [redacted] weeks gestation of pregnancy                Z3A.21  ---------------------------------------------------------------------- Vital Signs  Weight (lb): 199                               Height:        5'7"  BMI:  31.16 ---------------------------------------------------------------------- Fetal Evaluation  Num Of Fetuses:         1  Fetal Heart Rate(bpm):  155  Cardiac Activity:       Observed  Presentation:           Breech  Placenta:               Anterior  P. Cord Insertion:      Visualized, central  Amniotic Fluid  AFI FV:      Within normal limits                              Largest Pocket(cm)                              6.1 ---------------------------------------------------------------------- Biometry  BPD:        50  mm     G. Age:  21w 1d         36  %    CI:        75.85   %    70 - 86                                                          FL/HC:      19.9   %    15.9 - 20.3  HC:       182   mm     G. Age:  20w 4d         11  %    HC/AC:      1.12        1.06 - 1.25  AC:      163.1  mm     G. Age:  21w 3d         42  %    FL/BPD:     72.4   %  FL:       36.2  mm     G. Age:  65w  3d         37  %    FL/AC:      22.2   %    20 - 24  HUM:      31.8  mm     G. Age:  20w 4d         27  %  Est. FW:     416  gm    0 lb 15 oz      42  % ---------------------------------------------------------------------- OB History  Gravidity:    6         Term:   2        Prem:   0        SAB:   3  TOP:          0       Ectopic:  0        Living: 2 ---------------------------------------------------------------------- Gestational Age  LMP:           26w 4d        Date:  12/20/17                 EDD:   09/26/18  U/S Today:  21w 1d                                        EDD:   11/03/18  Best:          Audrea Muscat 3d     Det. ByLoman Chroman         EDD:   11/01/18                                      (04/01/18) ---------------------------------------------------------------------- Anatomy  Cranium:               Appears normal         Aortic Arch:            Not well visualized  Cavum:                 Appears normal         Ductal Arch:            Not well visualized  Ventricles:            Appears normal         Diaphragm:              Appears normal  Choroid Plexus:        Appears normal         Stomach:                Appears normal, left                                                                        sided  Cerebellum:            Appears normal         Abdomen:                Appears normal  Posterior Fossa:       Appears normal         Abdominal Wall:         Appears nml (cord                                                                        insert, abd wall)  Nuchal Fold:           Not well visualized    Cord Vessels:           Appears normal (3  vessel cord)  Face:                  Orbits nl; profile not Kidneys:                Appear normal                         well visualized  Lips:                  Not well visualized    Bladder:                Appears normal  Thoracic:              Appears normal         Spine:                   Not well visualized  Heart:                 Appears normal         Upper Extremities:      Appears normal                         (4CH, axis, and                         situs)  RVOT:                  Appears normal         Lower Extremities:      Appears normal  LVOT:                  Not well visualized  Other:  Fetus appears to be female. Technically difficult due to maternal          habitus and fetal position. ---------------------------------------------------------------------- Cervix Uterus Adnexa  Cervix  Length:            3.7  cm.  Uterus  No abnormality visualized.  Left Ovary  No adnexal mass visualized.  Right Ovary  No adnexal mass visualized.  Cul De Sac  No free fluid seen.  Adnexa  No abnormality visualized. ---------------------------------------------------------------------- Impression  Ms. Rivet, G6 P2 at 21-weeks' gestation, was admitted  with the diagnosis of sepsis. She has a history of previous  cesarean delivery. Ob consultation was obtained (Dr.  Ilda Basset).  We performed a fetal anatomy scan. No markers of  aneuploidies or fetal structural defects are seen. Fetal  biometry is consistent with her previously-established dates.  Amniotic fluid is normal and good fetal activity is seen.  Patient understands the limitations of ultrasound in detecting  fetal anomalies. Resolution of images on bedside ultrasound  was suboptimal. ---------------------------------------------------------------------- Recommendations  -Follow-up scan in 4 weeks for completion of fetal anatomical  survey. It should, preferably, done at the MFM office in the  new [facility] to obtain quality images. ----------------------------------------------------------------------                  Tama High, MD Electronically Signed Final Report   06/24/2018 10:54 am ----------------------------------------------------------------------  [place] Mfm Ob Follow Up  Result Date:  07/21/2018 ----------------------------------------------------------------------  OBSTETRICS REPORT                       (Signed Final 07/21/2018 01:42 pm) ---------------------------------------------------------------------- Patient Info  ID #:  [id number]                          D.O.B.:  10/20/1985 (32 yrs)  Name:       Kelly [name]              Visit Date: 07/21/2018 09:25 am ---------------------------------------------------------------------- Performed By  Performed By:     Novella Rob        Ref. Address:     [address]                                                             [place], [place]  Attending:        Tama High MD        [facility]  Referred By:      Osborne Oman MD ---------------------------------------------------------------------- Orders   #  Description                          Code         Ordered By   1  [place] MFM OB FOLLOW UP                  49179.15     Verita Schneiders  ----------------------------------------------------------------------   #  Order #                    Accession #                 Episode #   1  [id number]                  [id number]                  [id number]  ---------------------------------------------------------------------- Indications   Encounter for other antenatal screening        Z36.2  follow-up   Drug use complicating pregnancy, second        O99.322   trimester (IV drug use)   Hypertension - Chronic/Pre-existing            O10.019   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   O99.412   in second trimester (bacterial endocarditis)   Other mental disorder complicating             O99.340   pregnancy, second trimester (bipolar, major   depressive disorder)   Anemia  during pregnancy in second trimester    O99.012   [redacted] weeks gestation of pregnancy                Z3A.25  ---------------------------------------------------------------------- Vital Signs  Weight (lb): 182                               Height:        5'7"  BMI:         28.5 ---------------------------------------------------------------------- Fetal Evaluation  Num Of Fetuses:         1  Fetal Heart Rate(bpm):  133  Cardiac Activity:       Observed  Presentation:           Transverse, head to maternal left  Placenta:               Anterior  P. Cord Insertion:      Previously Visualized  Amniotic Fluid  AFI FV:      Within normal limits                              Largest Pocket(cm)                              5.2 ---------------------------------------------------------------------- Biometry  BPD:      57.3  mm     G. Age:  23w 4d          3  %    CI:        68.08   %    70 - 86                                                          FL/HC:      20.4   %    18.7 - 20.3  HC:      222.1  mm     G. Age:  24w 1d          6  %    HC/AC:      1.03        1.04 - 1.22  AC:      214.7  mm     G. Age:  26w 0d         62  %    FL/BPD:     79.1   %    71 - 87  FL:       45.3  mm     G. Age:  25w 0d         27  %    FL/AC:      21.1   %  20 - 24  HUM:        43  mm     G. Age:  25w 5d         55  %  Est. FW:     794  gm    1 lb 12 oz      54  % ---------------------------------------------------------------------- OB History  Gravidity:    6         Term:   2        Prem:   0        SAB:   3  TOP:          0       Ectopic:  0        Living: 2 ---------------------------------------------------------------------- Gestational Age  LMP:           30w 3d        Date:  12/20/17                 EDD:   09/26/18  U/S Today:     24w 5d                                        EDD:   11/05/18  Best:          25w 2d     Det. ByLoman Chroman         EDD:   11/01/18                                      (04/01/18)  ---------------------------------------------------------------------- Anatomy  Cranium:               Appears normal         Aortic Arch:            Not well visualized  Cavum:                 Appears normal         Ductal Arch:            Not well visualized  Ventricles:            Previously seen        Diaphragm:              Appears normal  Choroid Plexus:        Previously seen        Stomach:                Appears normal, left                                                                        sided  Cerebellum:            Previously seen        Abdomen:                Appears normal  Posterior Fossa:       Previously seen        Abdominal  Wall:         Previously seen  Nuchal Fold:           Not applicable (>38    Cord Vessels:           Previously seen                         wks GA)  Face:                  Orbits ps; profile not Kidneys:                Appear normal                         well visualized  Lips:                  Appears normal         Bladder:                Appears normal  Thoracic:              Appears normal         Spine:                  Not well visualized  Heart:                 Previously seen        Upper Extremities:      Previously seen  RVOT:                  Previously seen        Lower Extremities:      Previously seen  LVOT:                  Appears normal  Other:  Fetus appears to be female. Technically difficult due to maternal          habitus and fetal position. ---------------------------------------------------------------------- Cervix Uterus Adnexa  Cervix  Not visualized (advanced GA >24wks) ---------------------------------------------------------------------- Impression  Patient is admitted for treatment of bacterial endocarditis.  She is on IV antibiotics.  Amniotic fluid is normal and good fetal activity is seen. Fetal  growth is appropriate for gestational age. Nose/lips and  LVOT are normal.  This study was remotely read.  ---------------------------------------------------------------------- Recommendations  -Follow-up scan in 4 weeks for fetal growth and completion of  anatomy if possible. ----------------------------------------------------------------------                  Tama High, MD Electronically Signed Final Report   07/21/2018 01:42 pm ----------------------------------------------------------------------    Medications:  Scheduled . chlorhexidine  15 mL Mouth/Throat BID  . enoxaparin (LOVENOX) injection  40 mg Subcutaneous Q24H  . feeding supplement (ENSURE ENLIVE)  237 mL Oral BID BM  . methadone  40 mg Oral q1800  . methadone  60 mg Oral Daily  . nystatin   Topical BID  . pantoprazole  40 mg Oral BID  . prenatal multivitamin  1 tablet Oral Q1200  . sodium chloride flush  10-40 mL Intracatheter Q12H   I have reviewed the patient's current medications.  ASSESSMENT: [id number] 3w2dEstimated Date of Delivery: 11/01/18  Patient Active Problem List   Diagnosis Date Noted  . Infection due to Streptococcus mitis group 07/20/2018  . History of VBAC 07/16/2018  . Endocarditis 07/12/2018  . Dental caries 07/03/2018  . 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 Preexisting hypertension complicating pregnancy, antepartum 06/29/2018  . High-risk pregnancy in second trimester 06/27/2018  . Rubella non-immune status, antepartum 06/23/2018  . Influenza B 06/17/2018  . Bacteremia due to Enterococcus 06/15/2018  . Multifocal pneumonia 06/14/2018  . Hyponatremia 06/14/2018  . Elevated transaminase level 06/14/2018  . 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 History of bacterial endocarditis 06/14/2018  . Severe tricuspid regurgitation by prior echocardiogram 06/14/2018  . 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 Difficult intravenous access   . IV drug abuse complicating pregnancy ([place])   . AKI (acute kidney injury) ([place])   . Sepsis due to pneumonia  ([place]) 12/02/2017  . Bipolar disorder ([place]) 07/31/2016  . Major depressive disorder, recurrent episode with anxious distress ([place]) 07/29/2016  . Normal labor 07/26/2016  . Foreign body granuloma of soft tissue, NEC, right upper arm   . Pyelonephritis 05/28/2016  . Polysubstance abuse ([place]) 05/25/2016  . No prenatal care in current pregnancy 05/25/2016  . Chronic hypertension affecting pregnancy 05/25/2016  . History of cesarean delivery affecting pregnancy 05/25/2016  . Community acquired pneumonia 05/25/2016  . Pyelonephritis affecting pregnancy 05/24/2016  . Palliative care encounter   . Abscess of right breast 07/13/2014  . Opioid use disorder, severe, dependence ([place]) 08/25/2013  . Unspecified episodic mood disorder 08/25/2013  . ADHD (attention deficit hyperactivity disorder) 08/25/2013  . Cocaine abuse ([place]) 08/25/2013  . Thoracic back pain 01/29/2013  . CROHN'S DISEASE 05/03/2005  . COLONIC POLYPS 02/28/2003    PLAN: - Continue inpatient management of bacterial endocarditis - Follow up growth ultrasound in April  - Patient noted to be anemic with Hg 8.5 on 3/3. Recommend Iron infusion Fereheme x 2 - Patient desires BTL- BTL papers signed on 3/4 and scanned under media tab   Maliah Pyles 07/21/2018,2:41 PM

## 2018-07-21 NOTE — Progress Notes (Signed)
FHR 140 BPM, min variability, 10x10 accels, no decels, no uc's. Denies vaginal bleeding or leaking of fluid.Dr. Elly Modena updated.

## 2018-07-22 DIAGNOSIS — A491 Streptococcal infection, unspecified site: Secondary | ICD-10-CM

## 2018-07-22 LAB — CBC
HCT: 29.6 % — ABNORMAL LOW (ref 36.0–46.0)
Hemoglobin: 9.2 g/dL — ABNORMAL LOW (ref 12.0–15.0)
MCH: 27.8 pg (ref 26.0–34.0)
MCHC: 31.1 g/dL (ref 30.0–36.0)
MCV: 89.4 fL (ref 80.0–100.0)
Platelets: 368 10*3/uL (ref 150–400)
RBC: 3.31 MIL/uL — ABNORMAL LOW (ref 3.87–5.11)
RDW: 17.1 % — AB (ref 11.5–15.5)
WBC: 6.4 10*3/uL (ref 4.0–10.5)
nRBC: 0 % (ref 0.0–0.2)

## 2018-07-22 LAB — BASIC METABOLIC PANEL
Anion gap: 9 (ref 5–15)
BUN: 7 mg/dL (ref 6–20)
CALCIUM: 8.9 mg/dL (ref 8.9–10.3)
CO2: 25 mmol/L (ref 22–32)
Chloride: 99 mmol/L (ref 98–111)
Creatinine, Ser: 0.39 mg/dL — ABNORMAL LOW (ref 0.44–1.00)
GFR calc Af Amer: >60 mL/min (ref >60)
Glucose, Bld: 96 mg/dL (ref 70–99)
Potassium: 3.8 mmol/L (ref 3.5–5.1)
Sodium: 133 mmol/L — ABNORMAL LOW (ref 135–145)

## 2018-07-22 LAB — VANCOMYCIN, TROUGH: Vancomycin Tr: 10 ug/mL — ABNORMAL LOW (ref 15–20)

## 2018-07-22 LAB — VANCOMYCIN, PEAK: VANCOMYCIN PK: 24 ug/mL — AB (ref 30–40)

## 2018-07-22 MED ORDER — SODIUM CHLORIDE 0.9 % IV SOLN
510.0000 mg | INTRAVENOUS | Status: AC
  Administered 2018-07-22 – 2018-07-29 (×2): 510 mg via INTRAVENOUS
  Filled 2018-07-22 (×2): qty 17

## 2018-07-22 NOTE — Progress Notes (Signed)
Pharmacy Antibiotic Note  Kelly [name] is a 33 y.o. female admitted on 06/14/2018 with Strep and Enterococcal endocarditis.  Patient continues on  IV vancomycin, ampicillin and ceftriaxone.  These three IV antibiotics are to continue x 6 weeks per ID recommendation, through 08/03/18.  Patient to stay inpatient for duration of IV antibiotic therapy. Of note, patient is [redacted] weeks pregnant.  Following weekly Vancomycin levels and SCr. Afebrile, WBC 6.4, SCr 0.39 stable. Vancomycin 1gm given 3/10 at 0507 (on 1gm q8h) Vancomycin Peak 24 3/10 at 0800 Vancomycin Trough 10 3/10 at 1310 AUC 471.7 >> no change   Plan:  Continue Vancomycin 1gm IV q8h   Goal AUC 400-550  Antibiotics thru 08/03/18  Vanc levels at least weekly.  Bmet and CBC at least weekly   Height: 5' 7"  (170.2 cm) Weight: 182 lb 3.4 oz (82.6 kg) IBW/kg (Calculated) : 61.6  Temp (24hrs), Avg:97.8 F (36.6 C), Min:97.8 F (36.6 C), Max:97.8 F (36.6 C)  Recent Labs  Lab 07/21/18 1525 07/22/18 0800 07/22/18 1310  WBC 7.8 6.4  --   CREATININE  --  0.39*  --   VANCOTROUGH  --   --  10*  VANCOPEAK  --  24*  --     Estimated Creatinine Clearance: 110.5 mL/min (A) (by C-G formula based on SCr of 0.39 mg/dL (L)).    Allergies  Allergen Reactions  . Hydrocodone Itching  . Morphine And Related Nausea And Vomiting    Azithromycin 2/1>>2/2 Vancomcyin 2/1>>2/2, 2/5>>(3/22) Ceftriaxone 2/1>>(3/22) Ampicillin 2/4>>(3/22) Tamiflu 2/3>>2/7  2/6: Levels 19/11 (5 hours apart) -  on 1gm q8h - AUC low at 389 on this dose >> dose adjusted to 2g IV q12h for estimated AUC 517 2/9: Levels 39/9 (12 hrs apart) - on Vanc 2gm IV q12h - >AUC supratherapeutic at 597 (goal 400-550) > adjusted to 1250 mg IV q8h for estimated AUC 556 - Calculated Ke 0.2185, half-life 3.2 hrs 2/12: VP/VT 30/14, AUC 577 on 1231m q8 >> 1g q8 2/17: VP/VT 19/13, AUC 505.6 on 10070mq8, ke 0.1198, t1/2 5.8h - cont 1000 mg q8h 2/25: VP/VT 29/13, AUC 531.8  on 1000 mg q8h, ke 0.138, t1/2 5.0h-continue 1000 mg q8h 3/3: VP 26/VT 13, AUC 535 on 1gm IV q8h - Ke 0.146, t 1/2 4.7 hrs. - continue 1gm q8h 3/10: VP 24/VT 10, AUC 471.7 on 1gm IV q8h - Ke 0.169, t 1/2 4.1 hrs. - continue 1gm q8h   2/1 Blood x 2: Enterococcus faecalis (pan-S), Strep sanguinis 1/2 (R-PCN and CTX, S-Vanc) and strep mitis/oralis (R-tetra, I-PCN, S-Vanc/[place]/CTX) 2/1 Urine: E coli - sensitive to cefazolin, ceftriaxone; R - amp, cipro, septra, unasyn, zosyn 2/3 Influenza PCR - positive B 2/3 Blood x 2: negative 2/7 RPR negative 2/7 HIV non-reactive 2/7 Rubella < 0.90 = non-immune 2/13 urine - negative 2/20 MRSA PCR: negative  Thank you for allowing pharmacy to be part of this patients care team.  KiHorton ChinPharm.D., BCPS Clinical Pharmacist Clinical phone for 07/22/2018 from 2:30-10:00 is [phone] **Pharmacist phone directory can now be found on [email or web address] ([email or web address]).  Listed under [facility] 07/22/2018 7:47 PM

## 2018-07-22 NOTE — Progress Notes (Signed)
1645 Daily FHR NST completed. Pt denies vaginal bleeding, LOF, or UC's.  Pt reports good fetal movement.  Tracing intermittent.  Cardio held by  [employer] throughout. Fetal movement palpated.

## 2018-07-22 NOTE — Progress Notes (Signed)
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   Positive blood cultures on 06/14/2018.  TEE large tricuspid valve vegetation, preserved EF, elevated RVSP, enlarged RV.  Not a surgical candidate per CT surgery evaluation. Blood cultures on 2/3 have remained negative over 5 days -Continue 6 weeks of IV ceftriaxone, IV ampicillin, IV vancomycin, end date 3/22 per ID recommendations to complete in hospital given recent IV drug use, follow-up with ID arranged  Current pregnancy, Monitored by obstetric RN with daily fetal NST  Estimated date of delivery 11/01/2018, Plan to initiate routine monitoring once which is viability in 24 weeks, OB recommends avoiding treating BP in pregnancy is consistently greater than 150/100. Fetal ultrasound wnl on 3/9.  -Continue prenatal vitamins - Daily fetal NST by obstetric RN  Dental caries s/p dentral extration ( 07/03/18) Dental imaging reviewed dental abscess/dental caries prompting s dental extraction with alveoloplasty on 07/03/2018 by dentistry.  Healing well per their evaluation postoperatively 3/3. -In future will need antibiotic medication prior to invasive dental procedures given previous endocarditis.  IV heroin/amphetamine abuse. -Continue methadone(had not been adherent or following pain clinic since 04/2018), will need to re-establish with pain clinic  Groin rash, very likely yeast infection, improving -Continue nystatin powder, closely monitor  Sepsis secondary to bacteremia of Streptococcus oralis/mitis and E faecalis secondary to tricuspid valve endocarditis along with influenza B infection, multifocal pneumonia, resolved On presentation was hypotensive, tachypneic, tachycardic, white count of 15.9.Bilateral airspace opacities on  admission chest x-ray.  No longer having cough.  Completed antibiotic course with IV ceftriaxone for multifocal pneumonia.  Doing well on room air.   -Still completing IV antibiotic course for endocarditis.  Physiologic anemia pregnancy required 2 units transfusion for hemoglobin of 6.8 (2/7).  Has remained stable between 7 and 8. Hgb currently 9.2. Iron panel labs consistent with iron deficiency anemia - OB recommends Fere heme infusion x2 (3/10) -Repeat CBC on 3/9  Back pain, resolved Given bacteremia and known IV drug use MRI was obtained on admission which was negative for active infection of lumbar spine.  Influenza B, resolved. -Completed course of oseltamivir.  No longer on droplet precautions  Dysuria, resolved Found to have E. coli UTI on 2/1, completed course with IV ceftriaxone.  Most recent urine culture on 2/13- for growth  Code Status: Full code Family Communication: No family at bedside  Disposition Plan: Continue IV antibiotics as mentioned above, closely monitor    Consultants:  Dentistry, ID, OB/GYN  Procedures: Procedures:   2/3 TTE  1. Tricuspid regurgitation severe by color flow Doppler. There is a large mobile tricuspid valve vegetation on the anterior leaflet. The TV vegetation measures 10 mm x 20 mm.  2. The left ventricle has normal systolic function of 42-59%. The cavity size is normal. There is no left ventricular wall thickness. Echo evidence of normal diastolic filling patterns.  3. The right ventricle is moderately enlarged in size. There is normal systolic function. Right ventricular systolic pressure is normal with an estimated pressure of 43.8 mmHg.  4. Mildly dilated left atrial size.  5. Moderately dilated right atrial size.  6. The mitral valve is myxomatous. There is mild thickening. Regurgitation is not visualized by color flow Doppler.  7. Large vegetation on the tricuspid valve.  8. Myxomatous tricuspid valve.  9. Tricuspid regurgitation  severe. 10. No atrial level shunt detected by color  flow Doppler.  07/03/18 1. Multiple extraction of tooth numbers30 and 31 2.Onequadrants of alveoloplasty Antibiotics:  IV ceftriaxone, 2/1-  IV vancomycin 2/1, 2/5-  IV ampicillin 2/4  Oseltamivir 2/3- 2/7  HPI/Subjective:  Sherrell KAITLINN IVERSEN is a 33 y.o. 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  Hospital course: On initial presentation patient was afebrile, tachypneic, tachycardic, with leukocytosis with hypotension consistent with sepsis.  Marland Kitchen  Her hCG was greater than 2000 and ultrasound showed intrauterine gestation with estimated gestational age of [redacted] weeks.  MRI of back was negative for acute infection.  She was initially started on IV vancomycin and Rocephin and azithromycin in the ED before transition to IV Rocephin due to multifocal pneumonia and Tamiflu for influenza B.  IV ampicillin and IV vancomycin were later added was added to this regimen due to E faecalis and Streptococcus mitis/paralysis bacteremia.  CT surgery was consulted due to worsening vegetation on TTE and severe TR patient is not a surgical candidate patient has been on IV ceftriaxone, IV ampicillin, IV vancomycin for MRSA bacterial endocarditis/bacteremia    Having some difficulty sleeping Denies any cough, fevers, chills, chest pain, shortness of breath  Objective: Vitals:   07/22/18 0759 07/22/18 1749  BP: 103/74 110/76  Pulse: 83 87  Resp: 16 16  Temp: 97.8 F (36.6 C) 97.8 F (36.6 C)  SpO2: 99% 97%     Intake/Output Summary (Last 24 hours) at 07/22/2018 1843 Last data filed at 07/22/2018 1800 Gross per 24 hour  Intake 890 ml  Output 1800 ml  Net -910 ml   Filed Weights   07/19/18 0500 07/21/18 0524 07/22/18 0627  Weight: 82.6 kg 82.6 kg 82.6 kg    Exam:   General: Lying in bed in no distress  Cardiovascular: Regular rate and rhythm, loud SEM heard, no edema  Respiratory: Normal respiratory effort on room air  Abdomen: Pregnant, nontender  Musculoskeletal: Normal range of motion  Skin Red rash in groin area (dry), powder in place  Neurologic alert and oriented x4, no appreciable focal deficits  Data Reviewed: Basic Metabolic Panel: Recent Labs  Lab 07/22/18 0800  NA 133*  K 3.8  CL 99  CO2 25  GLUCOSE 96  BUN 7  CREATININE 0.39*  CALCIUM 8.9   Liver Function Tests: No results for input(s): AST, ALT, ALKPHOS, BILITOT, PROT, ALBUMIN in the last 168 hours. No results for input(s): LIPASE, AMYLASE in the last 168 hours. No results for input(s): AMMONIA in the last 168 hours. CBC: Recent Labs  Lab 07/21/18 1525 07/22/18 0800  WBC 7.8 6.4  HGB 9.5* 9.2*  HCT 30.4* 29.6*  MCV 89.4 89.4  PLT 379 368   Cardiac Enzymes: No results for input(s): CKTOTAL, CKMB, CKMBINDEX, TROPONINI in the last 168 hours. BNP (last 3 results) No results for input(s): BNP in the last 8760 hours.  ProBNP (last 3 results) No results for input(s): PROBNP in the last 8760 hours.  CBG: No results for input(s): GLUCAP in the last 168 hours.  No results found for this or any previous visit (from the past 240 hour(s)).   Studies: [place] Mfm Ob  Follow Up  Result Date: 07/21/2018 ----------------------------------------------------------------------  OBSTETRICS REPORT                       (Signed Final 07/21/2018 01:42 pm) ---------------------------------------------------------------------- Patient Info  ID #:       [id number]                          D.O.B.:  [birth year] (32 yrs)   Name:       [name]              Visit Date: 07/21/2018 09:25 am ---------------------------------------------------------------------- Performed By  Performed By:     Novella Rob        Ref. Address:     [address]                    [place], [place]  Attending:        Tama High MD        [facility]  Referred By:      Osborne Oman MD ---------------------------------------------------------------------- Orders   #  Description                          Code         Ordered By   1  [place] MFM OB FOLLOW UP                  32440.10     Verita Schneiders  ----------------------------------------------------------------------   #  Order #                    Accession #                 Episode #   1  [id number]  [id number]                  [id number]  ---------------------------------------------------------------------- Indications   Encounter for other antenatal screening        Z36.2   follow-up   Drug use complicating pregnancy, second        O99.322   trimester (IV drug use)   Hypertension - Chronic/Pre-existing            O10.019   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   O99.412   in second trimester (bacterial endocarditis)   Other mental disorder complicating             O99.340   pregnancy, second trimester (bipolar, major   depressive disorder)   Anemia during pregnancy in second trimester    O99.012   [redacted] weeks gestation of pregnancy                Z3A.25  ---------------------------------------------------------------------- Vital Signs  Weight (lb): 182                               Height:        5'7"  BMI:         28.5  ---------------------------------------------------------------------- Fetal Evaluation  Num Of Fetuses:         1  Fetal Heart Rate(bpm):  133  Cardiac Activity:       Observed  Presentation:           Transverse, head to maternal left  Placenta:               Anterior  P. Cord Insertion:      Previously Visualized  Amniotic Fluid  AFI FV:      Within normal limits                              Largest Pocket(cm)                              5.2 ---------------------------------------------------------------------- Biometry  BPD:      57.3  mm     G. Age:  23w 4d          3  %    CI:        68.08   %    70 - 86                                                          FL/HC:      20.4   %    18.7 - 20.3  HC:      222.1  mm     G. Age:  24w 1d          6  %    HC/AC:      1.03        1.04 - 1.22  AC:      214.7  mm     G. Age:  26w 0d         62  %    FL/BPD:     79.1   %    71 - 87  FL:  45.3  mm     G. Age:  25w 0d         27  %    FL/AC:      21.1   %    20 - 24  HUM:        43  mm     G. Age:  25w 5d         55  %  Est. FW:     794  gm    1 lb 12 oz      54  % ---------------------------------------------------------------------- OB History  Gravidity:    6         Term:   2        Prem:   0        SAB:   3  TOP:          0       Ectopic:  0        Living: 2 ---------------------------------------------------------------------- Gestational Age  LMP:           30w 3d        Date:  12/20/17                 EDD:   09/26/18  U/S Today:     24w 5d                                        EDD:   11/05/18  Best:          25w 2d     Det. ByLoman Chroman         EDD:   11/01/18                                      (04/01/18) ---------------------------------------------------------------------- Anatomy  Cranium:               Appears normal         Aortic Arch:            Not well visualized  Cavum:                 Appears normal         Ductal Arch:            Not well visualized  Ventricles:             Previously seen        Diaphragm:              Appears normal  Choroid Plexus:        Previously seen        Stomach:                Appears normal, left                                                                        sided  Cerebellum:            Previously seen  Abdomen:                Appears normal  Posterior Fossa:       Previously seen        Abdominal Wall:         Previously seen  Nuchal Fold:           Not applicable (>40    Cord Vessels:           Previously seen                         wks GA)  Face:                  Orbits ps; profile not Kidneys:                Appear normal                         well visualized  Lips:                  Appears normal         Bladder:                Appears normal  Thoracic:              Appears normal         Spine:                  Not well visualized  Heart:                 Previously seen        Upper Extremities:      Previously seen  RVOT:                  Previously seen        Lower Extremities:      Previously seen  LVOT:                  Appears normal  Other:  Fetus appears to be female. Technically difficult due to maternal          habitus and fetal position. ---------------------------------------------------------------------- Cervix Uterus Adnexa  Cervix  Not visualized (advanced GA >24wks) ---------------------------------------------------------------------- Impression  Patient is admitted for treatment of bacterial endocarditis.  She is on IV antibiotics.  Amniotic fluid is normal and good fetal activity is seen. Fetal  growth is appropriate for gestational age. Nose/lips and  LVOT are normal.  This study was remotely read. ---------------------------------------------------------------------- Recommendations  -Follow-up scan in 4 weeks for fetal growth and completion of  anatomy if possible. ----------------------------------------------------------------------                  Tama High, MD Electronically Signed Final Report    07/21/2018 01:42 pm ----------------------------------------------------------------------   Scheduled Meds: . chlorhexidine  15 mL Mouth/Throat BID  . enoxaparin (LOVENOX) injection  40 mg Subcutaneous Q24H  . feeding supplement (ENSURE ENLIVE)  237 mL Oral BID BM  . methadone  40 mg Oral q1800  . methadone  60 mg Oral Daily  . nystatin   Topical BID  . pantoprazole  40 mg Oral BID  . prenatal multivitamin  1 tablet Oral Q1200  . sodium chloride flush  10-40 mL Intracatheter Q12H   Continuous Infusions: . sodium chloride 10 mL/hr at 07/19/18 0400  . ampicillin (OMNIPEN) IV 2 g (07/22/18  1702)  . cefTRIAXone (ROCEPHIN)  IV 2 g (07/22/18 1023)  . lactated ringers 10 mL/hr at 07/03/18 2101  . vancomycin 1,000 mg (07/22/18 1328)    Principal Problem:   Bacteremia due to Enterococcus Active Problems:   Opioid use disorder, severe, dependence (HCC)   Sepsis due to pneumonia ([place])   IV drug abuse complicating pregnancy ([place])   Multifocal pneumonia   Hyponatremia   Elevated transaminase level   Degenerative arthritis of lumbar spine   History of bacterial endocarditis   Severe tricuspid regurgitation by prior echocardiogram   Influenza B   Rubella non-immune status, antepartum   High-risk pregnancy in second trimester   Preexisting hypertension complicating pregnancy, antepartum   Dental caries   Phobia of dental procedure   Endocarditis   Infection due to Streptococcus mitis group      [name]  [facility]

## 2018-07-23 ENCOUNTER — Inpatient Hospital Stay (HOSPITAL_COMMUNITY): Payer: Medicaid Other

## 2018-07-23 DIAGNOSIS — Z283 Underimmunization status: Secondary | ICD-10-CM

## 2018-07-23 DIAGNOSIS — O9989 Other specified diseases and conditions complicating pregnancy, childbirth and the puerperium: Secondary | ICD-10-CM

## 2018-07-23 LAB — CBC WITH DIFFERENTIAL/PLATELET
Abs Immature Granulocytes: 0.04 10*3/uL (ref 0.00–0.07)
Basophils Absolute: 0 10*3/uL (ref 0.0–0.1)
Basophils Relative: 1 %
EOS ABS: 0.1 10*3/uL (ref 0.0–0.5)
Eosinophils Relative: 1 %
HCT: 33.5 % — ABNORMAL LOW (ref 36.0–46.0)
Hemoglobin: 10.1 g/dL — ABNORMAL LOW (ref 12.0–15.0)
Immature Granulocytes: 1 %
Lymphocytes Relative: 18 %
Lymphs Abs: 1.3 10*3/uL (ref 0.7–4.0)
MCH: 27 pg (ref 26.0–34.0)
MCHC: 30.1 g/dL (ref 30.0–36.0)
MCV: 89.6 fL (ref 80.0–100.0)
Monocytes Absolute: 0.3 10*3/uL (ref 0.1–1.0)
Monocytes Relative: 5 %
NRBC: 0 % (ref 0.0–0.2)
Neutro Abs: 5.4 10*3/uL (ref 1.7–7.7)
Neutrophils Relative %: 74 %
Platelets: 368 10*3/uL (ref 150–400)
RBC: 3.74 MIL/uL — ABNORMAL LOW (ref 3.87–5.11)
RDW: 17.1 % — AB (ref 11.5–15.5)
WBC: 7.1 10*3/uL (ref 4.0–10.5)

## 2018-07-23 LAB — MAGNESIUM: Magnesium: 1.6 mg/dL — ABNORMAL LOW (ref 1.7–2.4)

## 2018-07-23 LAB — COMPREHENSIVE METABOLIC PANEL
ALT: 19 U/L (ref 0–44)
AST: 28 U/L (ref 15–41)
Albumin: 2.4 g/dL — ABNORMAL LOW (ref 3.5–5.0)
Alkaline Phosphatase: 192 U/L — ABNORMAL HIGH (ref 38–126)
Anion gap: 9 (ref 5–15)
BILIRUBIN TOTAL: 0.9 mg/dL (ref 0.3–1.2)
BUN: 8 mg/dL (ref 6–20)
CO2: 25 mmol/L (ref 22–32)
Calcium: 9.3 mg/dL (ref 8.9–10.3)
Chloride: 101 mmol/L (ref 98–111)
Creatinine, Ser: 0.48 mg/dL (ref 0.44–1.00)
GFR calc Af Amer: >60 mL/min (ref >60)
GFR calc non Af Amer: >60 mL/min (ref >60)
Glucose, Bld: 105 mg/dL — ABNORMAL HIGH (ref 70–99)
POTASSIUM: 3.7 mmol/L (ref 3.5–5.1)
Sodium: 135 mmol/L (ref 135–145)
Total Protein: 6.8 g/dL (ref 6.5–8.1)

## 2018-07-23 LAB — PHOSPHORUS: Phosphorus: 3.8 mg/dL (ref 2.5–4.6)

## 2018-07-23 MED ORDER — MAGNESIUM SULFATE 2 GM/50ML IV SOLN
2.0000 g | Freq: Once | INTRAVENOUS | Status: AC
  Administered 2018-07-23: 2 g via INTRAVENOUS
  Filled 2018-07-23: qty 50

## 2018-07-23 NOTE — Clinical Social Work Note (Signed)
CSW spoke with [facility] Coordinator and Charlett Nose took pt's referral and provided CSW with contact information for [place] has beds. CSW left voicemail with coordinator at [facility] awaiting call back.   [place], [place]

## 2018-07-23 NOTE — Progress Notes (Signed)
Rapid response nurse performed daily NST on pt.  Fetal heart rate is reassuring.  Fetal movement noticed throughout NST.  Pt has no ucs during monitoring.  Pt reports no leaking of fluid or vaginal bleeding.  Dr Elly Modena made aware.  No new orders received.

## 2018-07-23 NOTE — Plan of Care (Signed)
  Problem: Education: Goal: Knowledge of General Education information will improve Description Including pain rating scale, medication(s)/side effects and non-pharmacologic comfort measures Outcome: Progressing   Problem: Health Behavior/Discharge Planning: Goal: Ability to manage health-related needs will improve Outcome: Progressing   Problem: Clinical Measurements: Goal: Ability to maintain clinical measurements within normal limits will improve Outcome: Progressing   Problem: Clinical Measurements: Goal: Cardiovascular complication will be avoided Outcome: Progressing   Problem: Nutrition: Goal: Adequate nutrition will be maintained Outcome: Progressing   Problem: Coping: Goal: Level of anxiety will decrease Outcome: Progressing

## 2018-07-23 NOTE — Progress Notes (Signed)
PROGRESS NOTE    Kelly [name]  RSW:[id number] DOB: February 17, 1986 DOA: 06/14/2018 PCP: [facility]  Brief Narrative:  BRONNIE [name] is a 33 y.o. 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  Hospital course: On initial presentation patient was afebrile, tachypneic, tachycardic, with leukocytosis with hypotension consistent with sepsis. Her hCG was greater than 2000 and ultrasound showed intrauterine gestation with estimated gestational age of [redacted] weeks.  MRI of back was negative for acute infection.  She was initially started on IV vancomycin and Rocephin and azithromycin in the ED before transition to IV Rocephin due to multifocal pneumonia and Tamiflu for influenza B.  IV ampicillin and IV vancomycin were later added was added to this regimen due to E faecalis and Streptococcus mitis/paralysis bacteremia.  CT surgery was consulted due to worsening vegetation on TTE and severe TR patient is not a surgical candidate patient has been on IV ceftriaxone, IV ampicillin, IV vancomycin for MRSA bacterial endocarditis/bacteremia  **Interm History  Patient states she is still not sleeping well. Complained to the nurse about a "knot" on her abdomen that is worsening but ? Related to Lovenox injections. Will get Abdominal U/S to evaluate.   Assessment & Plan:   Principal Problem:   Bacteremia due to Enterococcus Active Problems:   Opioid use disorder, severe, dependence (HCC)   Sepsis due to pneumonia ([place])   IV drug abuse  complicating pregnancy ([place])   Multifocal pneumonia   Hyponatremia   Elevated transaminase level   Degenerative arthritis of lumbar spine   History of bacterial endocarditis   Severe tricuspid regurgitation by prior echocardiogram   Influenza B   Rubella non-immune status, antepartum   High-risk pregnancy in second trimester   Preexisting hypertension complicating pregnancy, antepartum   Dental caries   Phobia of dental procedure   Endocarditis   Infection due to Streptococcus mitis group  Streptococcus oralis/sanguinis/staph aureus bacteremia secondary to tricuspid valve endocarditis.   -Positive blood cultures on 06/14/2018.   -TEE large tricuspid valve vegetation, preserved EF, elevated RVSP, enlarged RV.   -Not a surgical candidate per CT surgery evaluation.  -Blood cultures on 2/3 have remained negative over 5 days -Continue 6 weeks of IV ceftriaxone, IV ampicillin, IV vancomycin, end date 3/22 per ID recommendations to complete in hospital given recent IV drug use -Follow-up with ID arranged  Current Pregnancy -Monitored by obstetric RN with daily fetal NST  Estimated date of delivery 11/01/2018, Plan to initiate routine monitoring once which is viability in 24 weeks, OB recommends avoiding treating BP in pregnancy unless consistently greater than 150/100. Fetal ultrasound wnl on 3/9.  -Continue Prenatal vitamins -C/w Daily fetal NST by obstetric RN  Dental caries s/p dentral extration ( 07/03/18) -Dental imaging reviewed dental abscess/dental caries prompting a dental extraction with alveoloplasty on 07/03/2018 by Dentistry.  Healing well per their evaluation postoperatively 3/3. -In future will need antibiotic medication prior to invasive dental procedures given previous endocarditis.  IV Heroin/amphetamine abuse. -Continue methadone 60 mg po daily and 40 mg at 1800 Daily (had not been adherent or following pain clinic since 04/2018),  -Will need  to re-establish with pain  clinic at [facility]/C  Groin rash, very likely yeast infection, improving -Continue Nystatin powder, closely monitor  Sepsis secondary to bacteremia of Streptococcus oralis/mitis and E faecalis secondary to tricuspid valve endocarditis along with Influenza B infection, multifocal pneumonia, resolved -On presentation was hypotensive, tachypneic, tachycardic, white count of 15.9.  -Bilateral airspace opacities on admission chest x-ray.   -No longer having cough.   -Completed antibiotic course with IV ceftriaxone for multifocal pneumonia but remains on IV Ceftriaxone for Bacteremia.  Doing well on room air.   -Still completing IV antibiotic course for endocarditis.  Physiologic anemia of pregnancy required 2 units transfusion for hemoglobin of 6.8 (2/7).   -Has remained stable between 7 and 8 but is now improving -Iron panel labs consistent with iron deficiency anemia as iron level was 82, U IBC was 552, TIBC was 634, saturation ratios of 13%, ferritin level is 29 -OB recommends Fere heme infusion x2 (3/10) -Hb/Hct is now 10.1/33.5 -Continue to Monitor for S/Sx of Bleeding -Repeat CBC in AM   Back pain, resolved -Given bacteremia and known IV drug use MRI was obtained on admission which was negative for active infection of lumbar spine. -C/w Acetaminophen 650 mg po q6hprn Mild Pain and C/w Ibuprofen 500 mg po q6hprn Moderate Pain; C/w Home Methadone of 60 mg po daily and 40 mg po qHS and  -Continue to Monitor Closely   Influenza B, resolved. -Completed course of Oseltamivir and no longer on droplet precautions -C/w Benzonatate 200 mg po TIDprn Cough  Dysuria in the setting of E Coli UTI, resolved -Found to have E. coli UTI on 2/1, completed course with IV ceftriaxone.   -Most recent urine culture on 2/13 showed No Growth  Hypomagnesemia -Patient's Mag Level this AM was 1.6 -Replete with IV Mag Sulfate 2 grams -Continue to Monitor and Replete as Necessary -Repeat Mag Level in AM    Hyperglycemia -Patient's blood sugar has been fluctuating -Last HbA1c was 4.9 and will repeat tomorrow AM -Continue to Monitor Blood Sugars carefully and if necessary may place on Sliding Scale Insulin   Hyponatremia -Improved and Na+ is now 135 -Continue to Monitor   DVT prophylaxis: Enoxaparin 40 mg sq q24h Code Status: FULL CODE Family Communication: No family present at bedside Disposition Plan: Pending completion of IV Abx  Consultants:   Dentistry  Infectious Diseases  OB-GYN   Procedures:  2/3 TTE  1. Tricuspid regurgitation severe by color flow Doppler. There is a large mobile tricuspid valve vegetation on the anterior leaflet. The TV vegetation measures 10 mm x 20 mm. 2. The left ventricle has normal systolic function of 93-26%. The cavity size is normal. There is no left ventricular wall thickness. Echo evidence of normal diastolic filling patterns. 3. The right ventricle is moderately enlarged in size. There is normal systolic function. Right ventricular systolic pressure is normal with an estimated pressure of 43.8 mmHg. 4. Mildly dilated left atrial size. 5. Moderately dilated right atrial size. 6. The mitral valve is myxomatous. There is mild thickening. Regurgitation is not visualized by color flow Doppler. 7. Large vegetation on the tricuspid valve. 8. Myxomatous tricuspid valve. 9. Tricuspid regurgitation severe. 10. No atrial level shunt detected by color flow Doppler.  07/03/18 1. Multiple extraction of tooth numbers30 and 31 2.Onequadrants of alveoloplasty   Antimicrobials:  Anti-infectives (From admission, onward)   Start     Dose/Rate Route Frequency Ordered Stop   06/26/18 0100  vancomycin (VANCOCIN) IVPB 1000 mg/200 mL premix  1,000 mg 200 mL/hr over 60 Minutes Intravenous Every 8 hours 06/25/18 1758 08/04/18 0459   06/23/18 0000  vancomycin (VANCOCIN) 1,250 mg in sodium chloride 0.9 % 250 mL IVPB  Status:  Discontinued      1,250 mg 166.7 mL/hr over 90 Minutes Intravenous Every 8 hours 06/22/18 2332 06/25/18 1758   06/20/18 1000  vancomycin (VANCOCIN) 2,000 mg in sodium chloride 0.9 % 500 mL IVPB  Status:  Discontinued     2,000 mg 250 mL/hr over 120 Minutes Intravenous Every 12 hours 06/19/18 2221 06/22/18 2332   06/18/18 2000  vancomycin (VANCOCIN) IVPB 1000 mg/200 mL premix  Status:  Discontinued     1,000 mg 200 mL/hr over 60 Minutes Intravenous Every 8 hours 06/18/18 1054 06/19/18 2221   06/18/18 1200  vancomycin (VANCOCIN) 1,750 mg in sodium chloride 0.9 % 500 mL IVPB     1,750 mg 250 mL/hr over 120 Minutes Intravenous  Once 06/18/18 1054 06/18/18 2241   06/17/18 2200  cefTRIAXone (ROCEPHIN) 2 g in sodium chloride 0.9 % 100 mL IVPB     2 g 200 mL/hr over 30 Minutes Intravenous Every 12 hours 06/17/18 1046 08/04/18 0959   06/17/18 1200  ampicillin (OMNIPEN) 2 g in sodium chloride 0.9 % 100 mL IVPB     2 g 300 mL/hr over 20 Minutes Intravenous Every 4 hours 06/17/18 1045 08/03/18 2359   06/16/18 1300  oseltamivir (TAMIFLU) capsule 75 mg     75 mg Oral 2 times daily 06/16/18 1231 06/20/18 0919   06/15/18 0700  vancomycin (VANCOCIN) IVPB 1000 mg/200 mL premix  Status:  Discontinued     1,000 mg 200 mL/hr over 60 Minutes Intravenous Every 8 hours 06/14/18 2216 06/15/18 0711   06/15/18 0500  cefTRIAXone (ROCEPHIN) 2 g in sodium chloride 0.9 % 100 mL IVPB  Status:  Discontinued     2 g 200 mL/hr over 30 Minutes Intravenous Every 24 hours 06/15/18 0445 06/17/18 1046   06/14/18 2200  azithromycin (ZITHROMAX) 500 mg in sodium chloride 0.9 % 250 mL IVPB  Status:  Discontinued     500 mg 250 mL/hr over 60 Minutes Intravenous Every 24 hours 06/14/18 2134 06/15/18 0711   06/14/18 2200  cefTRIAXone (ROCEPHIN) 1 g in sodium chloride 0.9 % 100 mL IVPB  Status:  Discontinued     1 g 200 mL/hr over 30 Minutes Intravenous Every 24 hours 06/14/18 2134 06/15/18 0445   06/14/18 2200  vancomycin (VANCOCIN) 1,500 mg in  sodium chloride 0.9 % 500 mL IVPB     1,500 mg 250 mL/hr over 120 Minutes Intravenous  Once 06/14/18 2134 06/15/18 0235     Subjective: Bedside states that she is doing okay but states that she did not sleep very well.  No nausea or vomiting.  Denies any lightheadedness or dizziness.  Complained to the nurse about a worsening abdominal knot so we will get ultrasound to evaluate.  No other concerns or complaints at this time.  Objective: Vitals:   07/22/18 1749 07/22/18 2329 07/23/18 0505 07/23/18 0813  BP: 110/76 112/77  (!) 127/93  Pulse: 87 89  89  Resp: 16 16  16   Temp: 97.8 F (36.6 C) 97.8 F (36.6 C)  (!) 97.5 F (36.4 C)  TempSrc: Oral Oral  Oral  SpO2: 97% 96%  94%  Weight:   82.6 kg   Height:        Intake/Output Summary (Last 24 hours) at 07/23/2018 1054 Last data filed at 07/23/2018  1916 Gross per 24 hour  Intake 957 ml  Output 1800 ml  Net -843 ml   Filed Weights   07/21/18 0524 07/22/18 0627 07/23/18 0505  Weight: 82.6 kg 82.6 kg 82.6 kg   Examination: Physical Exam:  Constitutional: WN/WD Pregnant Caucasian female in NAD and appears calm and comfortable Eyes: Lids and conjunctivae normal, sclerae anicteric  ENMT: External Ears, Nose appear normal. Grossly normal hearing. Poor Dentition  Neck: Appears normal, supple, no cervical masses, normal ROM, no appreciable thyromegaly; no JVD Respiratory: Diminished to auscultation bilaterally, no wheezing, rales, rhonchi or crackles. Normal respiratory effort and patient is not tachypenic. No accessory muscle use.  Cardiovascular: RRR, no murmurs / rubs / gallops. S1 and S2 auscultated. No extremity edema. 2+ pedal pulses. No carotid bruits.  Abdomen: Soft, non-tender, Distended due to pregnancy. Bowel sounds positive.  GU: Deferred. Musculoskeletal: No clubbing / cyanosis of digits/nails. No joint deformity upper and lower extremities. Skin: No rashes, lesions, ulcers on a limited skin evaluation. No induration; Warm  and dry.  Neurologic: CN 2-12 grossly intact with no focal deficits. Romberg sign and cerebellar reflexes not assessed.  Psychiatric: Normal judgment and insight. Alert and oriented x 3. Normal mood and appropriate affect.   Data Reviewed: I have personally reviewed following labs and imaging studies  CBC: Recent Labs  Lab 07/21/18 1525 07/22/18 0800 07/23/18 0839  WBC 7.8 6.4 7.1  NEUTROABS  --   --  5.4  HGB 9.5* 9.2* 10.1*  HCT 30.4* 29.6* 33.5*  MCV 89.4 89.4 89.6  PLT 379 368 606   Basic Metabolic Panel: Recent Labs  Lab 07/22/18 0800  NA 133*  K 3.8  CL 99  CO2 25  GLUCOSE 96  BUN 7  CREATININE 0.39*  CALCIUM 8.9   GFR: Estimated Creatinine Clearance: 110.5 mL/min (A) (by C-G formula based on SCr of 0.39 mg/dL (L)). Liver Function Tests: No results for input(s): AST, ALT, ALKPHOS, BILITOT, PROT, ALBUMIN in the last 168 hours. No results for input(s): LIPASE, AMYLASE in the last 168 hours. No results for input(s): AMMONIA in the last 168 hours. Coagulation Profile: No results for input(s): INR, PROTIME in the last 168 hours. Cardiac Enzymes: No results for input(s): CKTOTAL, CKMB, CKMBINDEX, TROPONINI in the last 168 hours. BNP (last 3 results) No results for input(s): PROBNP in the last 8760 hours. HbA1C: No results for input(s): HGBA1C in the last 72 hours. CBG: No results for input(s): GLUCAP in the last 168 hours. Lipid Profile: No results for input(s): CHOL, HDL, LDLCALC, TRIG, CHOLHDL, LDLDIRECT in the last 72 hours. Thyroid Function Tests: No results for input(s): TSH, T4TOTAL, FREET4, T3FREE, THYROIDAB in the last 72 hours. Anemia Panel: Recent Labs    07/21/18 1525  FERRITIN 29  TIBC 634*  IRON 82   Sepsis Labs: No results for input(s): PROCALCITON, LATICACIDVEN in the last 168 hours.  No results found for this or any previous visit (from the past 240 hour(s)).   Radiology Studies: No results found.  Scheduled Meds: . chlorhexidine   15 mL Mouth/Throat BID  . enoxaparin (LOVENOX) injection  40 mg Subcutaneous Q24H  . feeding supplement (ENSURE ENLIVE)  237 mL Oral BID BM  . methadone  40 mg Oral q1800  . methadone  60 mg Oral Daily  . nystatin   Topical BID  . pantoprazole  40 mg Oral BID  . prenatal multivitamin  1 tablet Oral Q1200  . sodium chloride flush  10-40 mL Intracatheter Q12H  Continuous Infusions: . sodium chloride 10 mL/hr at 07/19/18 0400  . ampicillin (OMNIPEN) IV 2 g (07/23/18 0836)  . cefTRIAXone (ROCEPHIN)  IV 2 g (07/23/18 1008)  . ferumoxytol 510 mg (07/22/18 2042)  . lactated ringers 10 mL/hr at 07/03/18 2101  . vancomycin 1,000 mg (07/23/18 0538)    LOS: 6 days   Kerney Elbe, DO [facility] PAGER is on [place]  If 7PM-7AM, please contact night-coverage [email or web address] Password [facility] 07/23/2018, 10:54 AM

## 2018-07-24 DIAGNOSIS — B952 Enterococcus as the cause of diseases classified elsewhere: Secondary | ICD-10-CM

## 2018-07-24 DIAGNOSIS — O26892 Other specified pregnancy related conditions, second trimester: Secondary | ICD-10-CM

## 2018-07-24 DIAGNOSIS — Z3A25 25 weeks gestation of pregnancy: Secondary | ICD-10-CM

## 2018-07-24 DIAGNOSIS — R7881 Bacteremia: Secondary | ICD-10-CM

## 2018-07-24 DIAGNOSIS — S301XXA Contusion of abdominal wall, initial encounter: Secondary | ICD-10-CM

## 2018-07-24 LAB — CBC WITH DIFFERENTIAL/PLATELET
Abs Immature Granulocytes: 0.03 10*3/uL (ref 0.00–0.07)
Abs Immature Granulocytes: 0.06 10*3/uL (ref 0.00–0.07)
Basophils Absolute: 0 10*3/uL (ref 0.0–0.1)
Basophils Absolute: 0 10*3/uL (ref 0.0–0.1)
Basophils Relative: 1 %
Basophils Relative: 1 %
Eosinophils Absolute: 0.1 10*3/uL (ref 0.0–0.5)
Eosinophils Absolute: 0.1 10*3/uL (ref 0.0–0.5)
Eosinophils Relative: 1 %
Eosinophils Relative: 2 %
HCT: 25.6 % — ABNORMAL LOW (ref 36.0–46.0)
HCT: 29.5 % — ABNORMAL LOW (ref 36.0–46.0)
Hemoglobin: 7.6 g/dL — ABNORMAL LOW (ref 12.0–15.0)
Hemoglobin: 8.8 g/dL — ABNORMAL LOW (ref 12.0–15.0)
Immature Granulocytes: 1 %
Immature Granulocytes: 1 %
LYMPHS ABS: 1.2 10*3/uL (ref 0.7–4.0)
Lymphocytes Relative: 25 %
Lymphocytes Relative: 20 %
Lymphs Abs: 1.4 10*3/uL (ref 0.7–4.0)
MCH: 27 pg (ref 26.0–34.0)
MCH: 27.2 pg (ref 26.0–34.0)
MCHC: 29.7 g/dL — ABNORMAL LOW (ref 30.0–36.0)
MCHC: 29.8 g/dL — ABNORMAL LOW (ref 30.0–36.0)
MCV: 91.1 fL (ref 80.0–100.0)
MCV: 91.3 fL (ref 80.0–100.0)
MONO ABS: 0.2 10*3/uL (ref 0.1–1.0)
Monocytes Absolute: 0.3 10*3/uL (ref 0.1–1.0)
Monocytes Relative: 4 %
Monocytes Relative: 4 %
Neutro Abs: 3.9 10*3/uL (ref 1.7–7.7)
Neutro Abs: 4.3 10*3/uL (ref 1.7–7.7)
Neutrophils Relative %: 68 %
Neutrophils Relative %: 72 %
Platelets: 189 10*3/uL (ref 150–400)
Platelets: 373 10*3/uL (ref 150–400)
RBC: 2.81 MIL/uL — ABNORMAL LOW (ref 3.87–5.11)
RBC: 3.23 MIL/uL — ABNORMAL LOW (ref 3.87–5.11)
RDW: 17.1 % — ABNORMAL HIGH (ref 11.5–15.5)
RDW: 17.2 % — ABNORMAL HIGH (ref 11.5–15.5)
WBC: 5.7 10*3/uL (ref 4.0–10.5)
WBC: 5.9 10*3/uL (ref 4.0–10.5)
nRBC: 0 % (ref 0.0–0.2)
nRBC: 0 % (ref 0.0–0.2)

## 2018-07-24 LAB — COMPREHENSIVE METABOLIC PANEL
ALT: 16 U/L (ref 0–44)
AST: 26 U/L (ref 15–41)
Albumin: 2 g/dL — ABNORMAL LOW (ref 3.5–5.0)
Alkaline Phosphatase: 158 U/L — ABNORMAL HIGH (ref 38–126)
Anion gap: 8 (ref 5–15)
BILIRUBIN TOTAL: 0.6 mg/dL (ref 0.3–1.2)
BUN: 8 mg/dL (ref 6–20)
CO2: 20 mmol/L — ABNORMAL LOW (ref 22–32)
Calcium: 7.4 mg/dL — ABNORMAL LOW (ref 8.9–10.3)
Chloride: 109 mmol/L (ref 98–111)
Creatinine, Ser: 0.38 mg/dL — ABNORMAL LOW (ref 0.44–1.00)
GFR calc Af Amer: >60 mL/min (ref >60)
GFR calc non Af Amer: >60 mL/min (ref >60)
Glucose, Bld: 92 mg/dL (ref 70–99)
Potassium: 3 mmol/L — ABNORMAL LOW (ref 3.5–5.1)
Sodium: 137 mmol/L (ref 135–145)
TOTAL PROTEIN: 5.9 g/dL — AB (ref 6.5–8.1)

## 2018-07-24 LAB — PHOSPHORUS: Phosphorus: 3.4 mg/dL (ref 2.5–4.6)

## 2018-07-24 LAB — MAGNESIUM: Magnesium: 1.3 mg/dL — ABNORMAL LOW (ref 1.7–2.4)

## 2018-07-24 MED ORDER — ALTEPLASE 2 MG IJ SOLR
2.0000 mg | Freq: Once | INTRAMUSCULAR | Status: AC
  Administered 2018-07-24: 2 mg
  Filled 2018-07-24: qty 2

## 2018-07-24 NOTE — Progress Notes (Signed)
Patient ID: Kelly [name], female   DOB: Dec 05, 1985, 33 y.o.   MRN: [id number] [place]) NOTE  Kelly [name] is a 33 y.o. [id number] at [redacted]w[redacted]d who is admitted for inpatient management of bacterial endocarditis (complication related to IV drug use) .    Fetal presentation is transverse. Length of Stay:  39  Days  Date of admission:06/14/2018  Subjective: Patient is doing well without complaints Patient reports the fetal movement as active. Patient reports uterine contraction  activity as none. Patient reports  vaginal bleeding as none. Patient describes fluid per vagina as None.  Vitals:  Blood pressure (!) 114/96, pulse 86, temperature (!) 97.4 F (36.3 C), temperature source Oral, resp. rate 18, height 5' 7"  (1.702 m), weight 82.6 kg, last menstrual period 12/30/2017, SpO2 99 %, unknown if currently breastfeeding. Vitals:   07/23/18 1745 07/23/18 2259 07/24/18 0500 07/24/18 0843  BP: 118/71 102/70  (!) 114/96  Pulse: 87 92  86  Resp: 16 16  18   Temp: 97.7 F (36.5 C) 97.8 F (36.6 C)  (!) 97.4 F (36.3 C)  TempSrc: Oral Oral  Oral  SpO2: 95% 95%  99%  Weight:   82.6 kg   Height:       Physical Examination:  GENERAL: Well-developed, well-nourished female in no acute distress.  LUNGS: Clear to auscultation bilaterally.  HEART: Regular rate and rhythm. ABDOMEN: Soft, nontender, nondistended. No organomegaly. PELVIC: Not performed EXTREMITIES: No cyanosis, clubbing, or edema, 2+ distal pulses.  Fetal Monitoring:  Monitored by Ob rapid reponse, reported as appropriate for gestational age  Labs:  Results for orders placed or performed during the hospital encounter of 06/14/18 (from the past 24 hour(s))  CBC with Differential/Platelet   Collection Time: 07/24/18  6:39 AM  Result Value Ref Range   WBC 5.7 4.0 - 10.5 K/uL   RBC 2.81 (L) 3.87 - 5.11 MIL/uL   Hemoglobin 7.6 (L) 12.0 - 15.0 g/dL   HCT 25.6 (L) 36.0 - 46.0 %   MCV 91.1 80.0 -  100.0 fL   MCH 27.0 26.0 - 34.0 pg   MCHC 29.7 (L) 30.0 - 36.0 g/dL   RDW 17.1 (H) 11.5 - 15.5 %   Platelets 189 150 - 400 K/uL   nRBC 0.0 0.0 - 0.2 %   Neutrophils Relative % 68 %   Neutro Abs 3.9 1.7 - 7.7 K/uL   Lymphocytes Relative 25 %   Lymphs Abs 1.4 0.7 - 4.0 K/uL   Monocytes Relative 4 %   Monocytes Absolute 0.2 0.1 - 1.0 K/uL   Eosinophils Relative 1 %   Eosinophils Absolute 0.1 0.0 - 0.5 K/uL   Basophils Relative 1 %   Basophils Absolute 0.0 0.0 - 0.1 K/uL   Immature Granulocytes 1 %   Abs Immature Granulocytes 0.03 0.00 - 0.07 K/uL    Imaging Studies:    Dg Orthopantogram  Result Date: 06/25/2018 CLINICAL DATA:  33year old female with a history of abscess EXAM: ORTHOPANTOGRAM/PANORAMIC COMPARISON:  None. FINDINGS: Periapical lucency of the right mandibular last molar tooth. Multiple lucencies of mandibular and maxillary T compatible with dental caries Dental amalgams. No displaced fracture. IMPRESSION: Periapical lucency the right mandibular last molar tooth compatible with abscess. Dental caries. Electronically Signed   By: JCorrie MckusickD.O.   On: 06/25/2018 19:26   [place]  Result Date: 07/23/2018 CLINICAL DATA:  Palpable abnormality seen in right lower quadrant. Reportedly former injection site. EXAM: ULTRASOUND ABDOMEN LIMITED COMPARISON:  None.  FINDINGS: Limited sonographic evaluation in the right lower quadrant of the abdomen demonstrates predominantly cystic but complex abnormality measuring 2.9 x 1.7 x 1.6 cm. This may represent hematoma or seroma, but abscess can not be excluded. Clinical correlation is recommended. IMPRESSION: 2.9 x 1.7 x 1.6 cm complex but predominantly cystic abnormality seen in area of palpable concern in right lower quadrant of abdomen. This may represent hematoma or seroma, but clinical correlation is recommended to rule out abscess. Electronically Signed   By: Marijo Conception, M.D.   On: 07/23/2018 15:57   [place] Mfm Ob Follow  Up  Result Date: 07/21/2018 ----------------------------------------------------------------------  OBSTETRICS REPORT                       (Signed Final 07/21/2018 01:42 pm) ---------------------------------------------------------------------- Patient Info  ID #:       [id number]                          D.O.B.:  April 30, 1986 (33 yrs)  Name:       Kelly [name]              Visit Date: 07/21/2018 09:25 am ---------------------------------------------------------------------- Performed By  Performed By:     Novella Rob        Ref. Address:     [address]                    [place], [place]  Attending:        Tama High MD        [facility]  Referred By:      Osborne Oman MD ---------------------------------------------------------------------- Orders   #  Description                          Code  Ordered By   1  [place] MFM OB FOLLOW UP                  57322.02     Verita Schneiders  ----------------------------------------------------------------------   #  Order #                    Accession #                 Episode #   1  [id number]                  [id number]                  [id number]  ---------------------------------------------------------------------- Indications   Encounter for other antenatal screening        Z36.2   follow-up   Drug use complicating pregnancy, second        O99.322   trimester (IV drug use)   Hypertension - Chronic/Pre-existing            O10.019   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   O99.412   in second trimester (bacterial endocarditis)   Other mental disorder complicating             O99.340   pregnancy, second trimester (bipolar, major   depressive  disorder)   Anemia during pregnancy in second trimester    O99.012   [redacted] weeks gestation of pregnancy                Z3A.25  ---------------------------------------------------------------------- Vital Signs  Weight (lb): 182                               Height:        5'7"  BMI:         28.5 ---------------------------------------------------------------------- Fetal Evaluation  Num Of Fetuses:         1  Fetal Heart Rate(bpm):  133  Cardiac Activity:       Observed  Presentation:           Transverse, head to maternal left  Placenta:               Anterior  P. Cord Insertion:      Previously Visualized  Amniotic Fluid  AFI FV:      Within normal limits                              Largest Pocket(cm)                              5.2 ---------------------------------------------------------------------- Biometry  BPD:      57.3  mm     G. Age:  23w 4d          3  %    CI:        68.08   %    70 - 86                                                          FL/HC:      20.4   %  18.7 - 20.3  HC:      222.1  mm     G. Age:  24w 1d          6  %    HC/AC:      1.03        1.04 - 1.22  AC:      214.7  mm     G. Age:  26w 0d         62  %    FL/BPD:     79.1   %    71 - 87  FL:       45.3  mm     G. Age:  25w 0d         27  %    FL/AC:      21.1   %    20 - 24  HUM:        43  mm     G. Age:  25w 5d         55  %  Est. FW:     794  gm    1 lb 12 oz      54  % ---------------------------------------------------------------------- OB History  Gravidity:    6         Term:   2        Prem:   0        SAB:   3  TOP:          0       Ectopic:  0        Living: 2 ---------------------------------------------------------------------- Gestational Age  LMP:           30w 3d        Date:  12/20/17                 EDD:   09/26/18  U/S Today:     24w 5d                                        EDD:   11/05/18  Best:          25w 2d     Det. By:  Loman Chroman         EDD:   11/01/18                                      (04/01/18)  ---------------------------------------------------------------------- Anatomy  Cranium:               Appears normal         Aortic Arch:            Not well visualized  Cavum:                 Appears normal         Ductal Arch:            Not well visualized  Ventricles:            Previously seen        Diaphragm:              Appears normal  Choroid Plexus:        Previously seen        Stomach:  Appears normal, left                                                                        sided  Cerebellum:            Previously seen        Abdomen:                Appears normal  Posterior Fossa:       Previously seen        Abdominal Wall:         Previously seen  Nuchal Fold:           Not applicable (>40    Cord Vessels:           Previously seen                         wks GA)  Face:                  Orbits ps; profile not Kidneys:                Appear normal                         well visualized  Lips:                  Appears normal         Bladder:                Appears normal  Thoracic:              Appears normal         Spine:                  Not well visualized  Heart:                 Previously seen        Upper Extremities:      Previously seen  RVOT:                  Previously seen        Lower Extremities:      Previously seen  LVOT:                  Appears normal  Other:  Fetus appears to be female. Technically difficult due to maternal          habitus and fetal position. ---------------------------------------------------------------------- Cervix Uterus Adnexa  Cervix  Not visualized (advanced GA >24wks) ---------------------------------------------------------------------- Impression  Patient is admitted for treatment of bacterial endocarditis.  She is on IV antibiotics.  Amniotic fluid is normal and good fetal activity is seen. Fetal  growth is appropriate for gestational age. Nose/lips and  LVOT are normal.  This study was remotely read.  ---------------------------------------------------------------------- Recommendations  -Follow-up scan in 4 weeks for fetal growth and completion of  anatomy if possible. ----------------------------------------------------------------------                  Tama High, MD Electronically Signed Final Report   07/21/2018 01:42 pm ----------------------------------------------------------------------    Medications:  Scheduled .  chlorhexidine  15 mL Mouth/Throat BID  . enoxaparin (LOVENOX) injection  40 mg Subcutaneous Q24H  . feeding supplement (ENSURE ENLIVE)  237 mL Oral BID BM  . methadone  40 mg Oral q1800  . methadone  60 mg Oral Daily  . nystatin   Topical BID  . pantoprazole  40 mg Oral BID  . prenatal multivitamin  1 tablet Oral Q1200  . sodium chloride flush  10-40 mL Intracatheter Q12H   I have reviewed the patient's current medications.  ASSESSMENT: [id number] 40w5dEstimated Date of Delivery: 11/01/18  Patient Active Problem List   Diagnosis Date Noted  . Infection due to Streptococcus mitis group 07/20/2018  . History of VBAC 07/16/2018  . Endocarditis 07/12/2018  . Dental caries 07/03/2018  . 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 Preexisting hypertension complicating pregnancy, antepartum 06/29/2018  . High-risk pregnancy in second trimester 06/27/2018  . Rubella non-immune status, antepartum 06/23/2018  . Influenza B 06/17/2018  . Bacteremia due to Enterococcus 06/15/2018  . Multifocal pneumonia 06/14/2018  . Hyponatremia 06/14/2018  . Elevated transaminase level 06/14/2018  . 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 History of bacterial endocarditis 06/14/2018  . Severe tricuspid regurgitation by prior echocardiogram 06/14/2018  . 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 Difficult intravenous access   . IV drug abuse complicating pregnancy ([place]   . AKI (acute kidney injury) ([facility]   . Sepsis due to pneumonia  ([facility] 12/02/2017  . Bipolar disorder ([facility] 07/31/2016  . Major depressive disorder, recurrent episode with anxious distress ([facility] 07/29/2016  . Normal labor 07/26/2016  . Foreign body granuloma of soft tissue, NEC, right upper arm   . Pyelonephritis 05/28/2016  . Polysubstance abuse ([facility] 05/25/2016  . No prenatal care in current pregnancy 05/25/2016  . Chronic hypertension affecting pregnancy 05/25/2016  . History of cesarean delivery affecting pregnancy 05/25/2016  . Community acquired pneumonia 05/25/2016  . Pyelonephritis affecting pregnancy 05/24/2016  . Palliative care encounter   . Abscess of right breast 07/13/2014  . Opioid use disorder, severe, dependence ([facility] 08/25/2013  . Unspecified episodic mood disorder 08/25/2013  . ADHD (attention deficit hyperactivity disorder) 08/25/2013  . Cocaine abuse ([place] 08/25/2013  . Thoracic back pain 01/29/2013  . CROHN'S DISEASE 05/03/2005  . COLONIC POLYPS 02/28/2003    PLAN: - Continue inpatient management of bacterial endocarditis. Patient reports last dose on 08/03/18 - Follow up growth ultrasound in April upon discharge - Patient noted to be anemic with Hg 7.6- Blood transfusion in process  - Will arrange outpatient ob care appointments when discharge date is definitively established  Bayani Renteria 07/24/2018,12:04 PM

## 2018-07-24 NOTE — Progress Notes (Signed)
1635 Daily NST completed.  Pt denies vaginal bleeding or LOF and reports good fetal movement. Pt denies UC's.

## 2018-07-24 NOTE — Progress Notes (Signed)
PROGRESS NOTE    Kelly [name]  HYQ:[id number] DOB: 03-15-1986 DOA: 06/14/2018 PCP: [facility]  Brief Narrative:  Kelly [name] is a 33 y.o. 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  Hospital course: On initial presentation patient was afebrile, tachypneic, tachycardic, with leukocytosis with hypotension consistent with sepsis. Her hCG was greater than 2000 and ultrasound showed intrauterine gestation with estimated gestational age of [redacted] weeks.  MRI of back was negative for acute infection.  She was initially started on IV vancomycin and Rocephin and azithromycin in the ED before transition to IV Rocephin due to multifocal pneumonia and Tamiflu for influenza B.  IV ampicillin and IV vancomycin were later added was added to this regimen due to E faecalis and Streptococcus mitis/paralysis bacteremia.  CT surgery was consulted due to worsening vegetation on TTE and severe TR patient is not a surgical candidate patient has been on IV ceftriaxone, IV ampicillin, IV vancomycin for MRSA bacterial endocarditis/bacteremia  **Interm History  Patient states she is still not sleeping well. Complained to the nurse about a "knot" on her abdomen that is worsening but ? Related to Lovenox injections. Will get Abdominal U/S to evaluate.   Assessment & Plan:   Principal Problem:   Bacteremia due to Enterococcus Active Problems:   Opioid use disorder, severe, dependence (HCC)   Sepsis due to pneumonia ([place])   IV drug abuse  complicating pregnancy ([place])   Multifocal pneumonia   Hyponatremia   Elevated transaminase level   Degenerative arthritis of lumbar spine   History of bacterial endocarditis   Severe tricuspid regurgitation by prior echocardiogram   Influenza B   Rubella non-immune status, antepartum   High-risk pregnancy in second trimester   Preexisting hypertension complicating pregnancy, antepartum   Dental caries   Phobia of dental procedure   Endocarditis   Infection due to Streptococcus mitis group  Streptococcus oralis/sanguinis/staph aureus bacteremia secondary to tricuspid valve endocarditis.   -Positive blood cultures on 06/14/2018.   -TEE large tricuspid valve vegetation, preserved EF, elevated RVSP, enlarged RV.   -Not a surgical candidate per CT surgery evaluation.  -Blood cultures on 2/3 have remained negative over 5 days -Continue 6 weeks of IV ceftriaxone, IV ampicillin, IV vancomycin, end date 3/22 per ID recommendations to complete in hospital given recent IV drug use -Follow-up with ID arranged  Current Pregnancy -Monitored by obstetric RN with daily fetal NST  Estimated date of delivery 11/01/2018, Plan to initiate routine monitoring once which is viability in 24 weeks, OB recommends avoiding treating BP in pregnancy unless consistently greater than 150/100. Fetal ultrasound wnl on 3/9.  -Continue Prenatal vitamins -C/w Daily fetal NST by obstetric RN; Per Nursing Note on 07/23/2018:  Fetal heart rate is reassuring.  Fetal movement noticed throughout NST.  Pt has no ucs during monitoring.  Pt reports no leaking of fluid or vaginal bleeding  Dental caries s/p dentral extration (07/03/18) -Dental imaging reviewed dental abscess/dental caries prompting a dental extraction with alveoloplasty on 07/03/2018 by Dentistry.  Healing well per their evaluation postoperatively 3/3. -In future will need antibiotic medication prior to invasive dental  procedures given previous endocarditis.  IV  Heroin/Amphetamine abuse. -Continue methadone 60 mg po daily and 40 mg at 1800 Daily (had not been adherent or following pain clinic since 04/2018),  -Will need to re-establish with pain clinic at [facility] -Social Worker making referral to [facility]   Groin rash, very likely yeast infection, improving -Continue Nystatin powder, closely monitor  Sepsis secondary to bacteremia of Streptococcus oralis/mitis and E faecalis secondary to tricuspid valve endocarditis along with Influenza B infection, multifocal pneumonia, resolved -On presentation was hypotensive, tachypneic, tachycardic, white count of 15.9.  -Bilateral airspace opacities on admission chest x-ray.   -No longer having cough.   -Completed antibiotic course with IV ceftriaxone for multifocal pneumonia but remains on IV Ceftriaxone for Bacteremia.  Doing well on room air.   -Still completing IV antibiotic course for endocarditis.  Physiologic anemia of pregnancy required 2 units transfusion for hemoglobin of 6.8 (2/7).   -Has remained stable between 7 and 8 but now worsened ? If spurious Drop from yesterday to today  -Iron panel labs consistent with iron deficiency anemia as iron level was 82, U IBC was 552, TIBC was 634, saturation ratios of 13%, ferritin level is 29 -OB recommends Fere heme infusion x2 (3/10) -Hb/Hct went from 10.1/33.5 -> 7.6/25.6 and ? Spurious drop so will repeat CBC but OB ? Ordering Blood  -Has an RLQ Abdominal Hematoma -Continue to Monitor for S/Sx of Bleeding -Repeat CBC in AM   Back pain, resolved -Given bacteremia and known IV drug use MRI was obtained on admission which was negative for active infection of lumbar spine. -C/w Acetaminophen 650 mg po q6hprn Mild Pain and C/w Ibuprofen 500 mg po q6hprn Moderate Pain; C/w Home Methadone of 60 mg po daily and 40 mg po qHS and  -Continue to Monitor Closely   Influenza B, resolved. -Completed course of Oseltamivir and no  longer on droplet precautions -C/w Benzonatate 200 mg po TIDprn Cough  Dysuria in the setting of E Coli UTI, resolved -Found to have E. coli UTI on 2/1, completed course with IV ceftriaxone.   -Most recent urine culture on 2/13 showed No Growth  Hypomagnesemia -Patient's Mag Level this AM was 1.6 -Replete with IV Mag Sulfate 2 grams -Continue to Monitor and Replete as Necessary -Repeat Mag Level this AM pending   Hyperglycemia -Patient's blood sugar has been fluctuating -Last HbA1c was 4.9 and will repeat tomorrow AM -Continue to Monitor Blood Sugars carefully and if necessary may place on Sliding Scale Insulin   Hyponatremia -Improved and Na+ is now 135 yesterday with repeat pending  -Continue to Monitor   Drop in Platelet Count -? Spurious Result -Patient's Platelet Count went from 368 -> 189 from yesterday to today -Will repeat this AM -Continue to Monitor and repeat CBC in AM   Abdominal Hematoma -Likely related to Lovenox Injections -Limited sonographic evaluation in the right lower quadrant of the abdomen demonstrates predominantly cystic but complex abnormality measuring 2.9 x 1.7 x 1.6 cm. This may represent hematoma or seroma, but abscess can not be excluded.  -Less Likely Abscess as patient is Afebrile and has now WBC -Noticed this happening since last week and in the setting of Lovenox Injections -Apply Warm Compress  DVT prophylaxis: Enoxaparin 40 mg sq q24h Code Status: FULL CODE Family Communication: No family present at bedside Disposition Plan: Pending completion of IV Abx  Consultants:   Dentistry  Infectious Diseases  OB-GYN   Procedures:  2/3 TTE  1.  Tricuspid regurgitation severe by color flow Doppler. There is a large mobile tricuspid valve vegetation on the anterior leaflet. The TV vegetation measures 10 mm x 20 mm. 2. The left ventricle has normal systolic function of 54-09%. The cavity size is normal. There is no left ventricular wall  thickness. Echo evidence of normal diastolic filling patterns. 3. The right ventricle is moderately enlarged in size. There is normal systolic function. Right ventricular systolic pressure is normal with an estimated pressure of 43.8 mmHg. 4. Mildly dilated left atrial size. 5. Moderately dilated right atrial size. 6. The mitral valve is myxomatous. There is mild thickening. Regurgitation is not visualized by color flow Doppler. 7. Large vegetation on the tricuspid valve. 8. Myxomatous tricuspid valve. 9. Tricuspid regurgitation severe. 10. No atrial level shunt detected by color flow Doppler.  07/03/18 1. Multiple extraction of tooth numbers30 and 31 2.Onequadrants of alveoloplasty   Antimicrobials:  Anti-infectives (From admission, onward)   Start     Dose/Rate Route Frequency Ordered Stop   06/26/18 0100  vancomycin (VANCOCIN) IVPB 1000 mg/200 mL premix     1,000 mg 200 mL/hr over 60 Minutes Intravenous Every 8 hours 06/25/18 1758 08/03/18 2359   06/23/18 0000  vancomycin (VANCOCIN) 1,250 mg in sodium chloride 0.9 % 250 mL IVPB  Status:  Discontinued     1,250 mg 166.7 mL/hr over 90 Minutes Intravenous Every 8 hours 06/22/18 2332 06/25/18 1758   06/20/18 1000  vancomycin (VANCOCIN) 2,000 mg in sodium chloride 0.9 % 500 mL IVPB  Status:  Discontinued     2,000 mg 250 mL/hr over 120 Minutes Intravenous Every 12 hours 06/19/18 2221 06/22/18 2332   06/18/18 2000  vancomycin (VANCOCIN) IVPB 1000 mg/200 mL premix  Status:  Discontinued     1,000 mg 200 mL/hr over 60 Minutes Intravenous Every 8 hours 06/18/18 1054 06/19/18 2221   06/18/18 1200  vancomycin (VANCOCIN) 1,750 mg in sodium chloride 0.9 % 500 mL IVPB     1,750 mg 250 mL/hr over 120 Minutes Intravenous  Once 06/18/18 1054 06/18/18 2241   06/17/18 2200  cefTRIAXone (ROCEPHIN) 2 g in sodium chloride 0.9 % 100 mL IVPB     2 g 200 mL/hr over 30 Minutes Intravenous Every 12 hours 06/17/18 1046 08/04/18 0959   06/17/18  1200  ampicillin (OMNIPEN) 2 g in sodium chloride 0.9 % 100 mL IVPB     2 g 300 mL/hr over 20 Minutes Intravenous Every 4 hours 06/17/18 1045 08/03/18 2359   06/16/18 1300  oseltamivir (TAMIFLU) capsule 75 mg     75 mg Oral 2 times daily 06/16/18 1231 06/20/18 0919   06/15/18 0700  vancomycin (VANCOCIN) IVPB 1000 mg/200 mL premix  Status:  Discontinued     1,000 mg 200 mL/hr over 60 Minutes Intravenous Every 8 hours 06/14/18 2216 06/15/18 0711   06/15/18 0500  cefTRIAXone (ROCEPHIN) 2 g in sodium chloride 0.9 % 100 mL IVPB  Status:  Discontinued     2 g 200 mL/hr over 30 Minutes Intravenous Every 24 hours 06/15/18 0445 06/17/18 1046   06/14/18 2200  azithromycin (ZITHROMAX) 500 mg in sodium chloride 0.9 % 250 mL IVPB  Status:  Discontinued     500 mg 250 mL/hr over 60 Minutes Intravenous Every 24 hours 06/14/18 2134 06/15/18 0711   06/14/18 2200  cefTRIAXone (ROCEPHIN) 1 g in sodium chloride 0.9 % 100 mL IVPB  Status:  Discontinued     1 g 200 mL/hr over 30 Minutes Intravenous Every  24 hours 06/14/18 2134 06/15/18 0445   06/14/18 2200  vancomycin (VANCOCIN) 1,500 mg in sodium chloride 0.9 % 500 mL IVPB     1,500 mg 250 mL/hr over 120 Minutes Intravenous  Once 06/14/18 2134 06/15/18 0235     Subjective: Seen and examined bedside and states that she had a rough night and did not sleep very well because of trouble getting blood from PICC Line. No Nausea or vomiting. No CP or SOB. Concerned about RLQ "knot." No other concerns or complaints at this time.   Objective: Vitals:   07/23/18 1745 07/23/18 2259 07/24/18 0500 07/24/18 0843  BP: 118/71 102/70  (!) 114/96  Pulse: 87 92  86  Resp: 16 16  18   Temp: 97.7 F (36.5 C) 97.8 F (36.6 C)  (!) 97.4 F (36.3 C)  TempSrc: Oral Oral  Oral  SpO2: 95% 95%  99%  Weight:   82.6 kg   Height:        Intake/Output Summary (Last 24 hours) at 07/24/2018 1202 Last data filed at 07/24/2018 0500 Gross per 24 hour  Intake 2550.62 ml  Output -   Net 2550.62 ml   Filed Weights   07/22/18 0627 07/23/18 0505 07/24/18 0500  Weight: 82.6 kg 82.6 kg 82.6 kg   Examination: Physical Exam:  Constitutional: Well-nourished, well-developed pregnant Caucasian female currently no acute distress Eyes: Lids and conjunctive are normal.  Sclera anicteric ENMT: External ears and nose appear normal.  Grossly normal hearing.  Poor dentition Neck: Appears supple no JVD Respiratory: Slightly diminished to auscultation bilaterally no appreciable wheezing, rales, rhonchi.  Patient was not tachypneic or using any accessory muscles to breathe Cardiovascular: Regular rate and rhythm.  No appreciable murmurs, rubs, gallops.  No lower extremity edema noted Abdomen: Soft, nontender, distended secondary pregnancy.  Has a indurated rated area of the right lower quadrant consistent with a hematoma from a Lovenox injection.  Bowel sounds are positive GU: Deferred Musculoskeletal: No contractures or cyanosis.  No joint deformities noted Skin: Skin is warm and dry no appreciable rashes or lesions on skin evaluation but does have tattoos Neurologic: Cranial nerves II through XII gross intact no appreciable focal deficits Psychiatric: Normal Judgment and insight.  Patient is alert and oriented x3  Data Reviewed: I have personally reviewed following labs and imaging studies  CBC: Recent Labs  Lab 07/21/18 1525 07/22/18 0800 07/23/18 0839 07/24/18 0639  WBC 7.8 6.4 7.1 5.7  NEUTROABS  --   --  5.4 3.9  HGB 9.5* 9.2* 10.1* 7.6*  HCT 30.4* 29.6* 33.5* 25.6*  MCV 89.4 89.4 89.6 91.1  PLT 379 368 368 295   Basic Metabolic Panel: Recent Labs  Lab 07/22/18 0800 07/23/18 0839  NA 133* 135  K 3.8 3.7  CL 99 101  CO2 25 25  GLUCOSE 96 105*  BUN 7 8  CREATININE 0.39* 0.48  CALCIUM 8.9 9.3  MG  --  1.6*  PHOS  --  3.8   GFR: Estimated Creatinine Clearance: 110.5 mL/min (by C-G formula based on SCr of 0.48 mg/dL). Liver Function Tests: Recent Labs   Lab 07/23/18 0839  AST 28  ALT 19  ALKPHOS 192*  BILITOT 0.9  PROT 6.8  ALBUMIN 2.4*   No results for input(s): LIPASE, AMYLASE in the last 168 hours. No results for input(s): AMMONIA in the last 168 hours. Coagulation Profile: No results for input(s): INR, PROTIME in the last 168 hours. Cardiac Enzymes: No results for input(s): CKTOTAL, CKMB, CKMBINDEX,  TROPONINI in the last 168 hours. BNP (last 3 results) No results for input(s): PROBNP in the last 8760 hours. HbA1C: No results for input(s): HGBA1C in the last 72 hours. CBG: No results for input(s): GLUCAP in the last 168 hours. Lipid Profile: No results for input(s): CHOL, HDL, LDLCALC, TRIG, CHOLHDL, LDLDIRECT in the last 72 hours. Thyroid Function Tests: No results for input(s): TSH, T4TOTAL, FREET4, T3FREE, THYROIDAB in the last 72 hours. Anemia Panel: Recent Labs    07/21/18 1525  FERRITIN 29  TIBC 634*  IRON 82   Sepsis Labs: No results for input(s): PROCALCITON, LATICACIDVEN in the last 168 hours.  No results found for this or any previous visit (from the past 240 hour(s)).   Radiology Studies: US Abdomen Limited  Result Date: 07/23/2018 CLINICAL DATA:  Palpable abnormality seen in right lower quadrant. Reportedly former injection site. EXAM: ULTRASOUND ABDOMEN LIMITED COMPARISON:  None. FINDINGS: Limited sonographic evaluation in the right lower quadrant of the abdomen demonstrates predominantly cystic but complex abnormality measuring 2.9 x 1.7 x 1.6 cm. This may represent hematoma or seroma, but abscess can not be excluded. Clinical correlation is recommended. IMPRESSION: 2.9 x 1.7 x 1.6 cm complex but predominantly cystic abnormality seen in area of palpable concern in right lower quadrant of abdomen. This may represent hematoma or seroma, but clinical correlation is recommended to rule out abscess. Electronically Signed   By: Marijo Conception, M.D.   On: 07/23/2018 15:57    Scheduled Meds: . chlorhexidine   15 mL Mouth/Throat BID  . enoxaparin (LOVENOX) injection  40 mg Subcutaneous Q24H  . feeding supplement (ENSURE ENLIVE)  237 mL Oral BID BM  . methadone  40 mg Oral q1800  . methadone  60 mg Oral Daily  . nystatin   Topical BID  . pantoprazole  40 mg Oral BID  . prenatal multivitamin  1 tablet Oral Q1200  . sodium chloride flush  10-40 mL Intracatheter Q12H   Continuous Infusions: . sodium chloride 10 mL/hr at 07/19/18 0400  . ampicillin (OMNIPEN) IV 2 g (07/24/18 1275)  . cefTRIAXone (ROCEPHIN)  IV 2 g (07/24/18 1059)  . ferumoxytol 510 mg (07/22/18 2042)  . lactated ringers 10 mL/hr at 07/03/18 2101  . vancomycin 1,000 mg (07/23/18 2311)    LOS: 47 days   Kerney Elbe, DO [facility] PAGER is on [place]  If 7PM-7AM, please contact night-coverage [email or web address] Password [facility] 07/24/2018, 12:02 PM

## 2018-07-25 DIAGNOSIS — R110 Nausea: Secondary | ICD-10-CM

## 2018-07-25 LAB — HEMOGLOBIN A1C
Hgb A1c MFr Bld: 4.9 % (ref 4.8–5.6)
Mean Plasma Glucose: 93.93 mg/dL

## 2018-07-25 LAB — PHOSPHORUS: PHOSPHORUS: 4.3 mg/dL (ref 2.5–4.6)

## 2018-07-25 LAB — CBC WITH DIFFERENTIAL/PLATELET
Abs Immature Granulocytes: 0.04 10*3/uL (ref 0.00–0.07)
Basophils Absolute: 0 10*3/uL (ref 0.0–0.1)
Basophils Relative: 0 %
EOS ABS: 0.1 10*3/uL (ref 0.0–0.5)
EOS PCT: 1 %
HCT: 30.5 % — ABNORMAL LOW (ref 36.0–46.0)
Hemoglobin: 9.3 g/dL — ABNORMAL LOW (ref 12.0–15.0)
Immature Granulocytes: 1 %
Lymphocytes Relative: 25 %
Lymphs Abs: 1.7 10*3/uL (ref 0.7–4.0)
MCH: 27.4 pg (ref 26.0–34.0)
MCHC: 30.5 g/dL (ref 30.0–36.0)
MCV: 89.7 fL (ref 80.0–100.0)
Monocytes Absolute: 0.4 10*3/uL (ref 0.1–1.0)
Monocytes Relative: 6 %
Neutro Abs: 4.7 10*3/uL (ref 1.7–7.7)
Neutrophils Relative %: 67 %
Platelets: 398 10*3/uL (ref 150–400)
RBC: 3.4 MIL/uL — ABNORMAL LOW (ref 3.87–5.11)
RDW: 17.2 % — ABNORMAL HIGH (ref 11.5–15.5)
WBC: 6.9 10*3/uL (ref 4.0–10.5)
nRBC: 0 % (ref 0.0–0.2)

## 2018-07-25 LAB — COMPREHENSIVE METABOLIC PANEL
ALT: 16 U/L (ref 0–44)
AST: 24 U/L (ref 15–41)
Albumin: 2.2 g/dL — ABNORMAL LOW (ref 3.5–5.0)
Alkaline Phosphatase: 160 U/L — ABNORMAL HIGH (ref 38–126)
Anion gap: 9 (ref 5–15)
BUN: 10 mg/dL (ref 6–20)
CO2: 25 mmol/L (ref 22–32)
Calcium: 9 mg/dL (ref 8.9–10.3)
Chloride: 101 mmol/L (ref 98–111)
Creatinine, Ser: 0.43 mg/dL — ABNORMAL LOW (ref 0.44–1.00)
GFR calc non Af Amer: >60 mL/min (ref >60)
Glucose, Bld: 86 mg/dL (ref 70–99)
Potassium: 3.9 mmol/L (ref 3.5–5.1)
Sodium: 135 mmol/L (ref 135–145)
Total Bilirubin: 0.7 mg/dL (ref 0.3–1.2)
Total Protein: 6.2 g/dL — ABNORMAL LOW (ref 6.5–8.1)

## 2018-07-25 LAB — MAGNESIUM: Magnesium: 1.5 mg/dL — ABNORMAL LOW (ref 1.7–2.4)

## 2018-07-25 MED ORDER — MAGNESIUM SULFATE 2 GM/50ML IV SOLN
2.0000 g | Freq: Once | INTRAVENOUS | Status: AC
  Administered 2018-07-25: 2 g via INTRAVENOUS
  Filled 2018-07-25: qty 50

## 2018-07-25 NOTE — Progress Notes (Signed)
Pharmacy Antibiotic Note  Kelly [name] is a 33 y.o. female admitted on 06/14/2018 with Strep and Enterococcal endocarditis.  Patient continues on  IV vancomycin, ampicillin and ceftriaxone.  These three IV antibiotics are to continue x 6 weeks per ID recommendation, through 08/03/18.  Patient to stay inpatient for duration of IV antibiotic therapy. Of note, patient is 21w6dpregnant.   Afebrile, WBC wnl, ANC wnl, doing well on RA,   No longer having cough.  On 3/10 we checked weekly Vancomycin levels and SCr. Vancomycin 1gm given 3/10 at 0507 (on 1gm q8h) Vancomycin Peak 24 3/10 at 0800 Vancomycin Trough 10 3/10 at 1310 AUC 471.7 >> no change   Plan:  Continue Vancomycin 1gm IV q8h   Goal AUC 400-550  Antibiotics thru 08/03/18  Vanc levels at least weekly, next due 07/29/18  Bmet and CBC at least weekly   Height: 5' 7"  (170.2 cm) Weight: 182 lb 1.6 oz (82.6 kg) IBW/kg (Calculated) : 61.6  Temp (24hrs), Avg:98 F (36.7 C), Min:97.9 F (36.6 C), Max:98.3 F (36.8 C)  Recent Labs  Lab 07/22/18 0800 07/22/18 1310 07/23/18 0839 07/24/18 0639 07/24/18 1331 07/25/18 0400  WBC 6.4  --  7.1 5.7 5.9 6.9  CREATININE 0.39*  --  0.48  --  0.38* 0.43*  VANCOTROUGH  --  10*  --   --   --   --   VANCOPEAK 24*  --   --   --   --   --     Estimated Creatinine Clearance: 110.5 mL/min (A) (by C-G formula based on SCr of 0.43 mg/dL (L)).    Allergies  Allergen Reactions  . Hydrocodone Itching  . Morphine And Related Nausea And Vomiting    Azithromycin 2/1>>2/2 Vancomcyin 2/1>>2/2, 2/5>>(3/22) Ceftriaxone 2/1>>(3/22) Ampicillin 2/4>>(3/22) Tamiflu 2/3>>2/7  2/6: Levels 19/11 (5 hours apart) -  on 1gm q8h - AUC low at 389 on this dose >> dose adjusted to 2g IV q12h for estimated AUC 517 2/9: Levels 39/9 (12 hrs apart) - on Vanc 2gm IV q12h - >AUC supratherapeutic at 597 (goal 400-550) > adjusted to 1250 mg IV q8h for estimated AUC 556 - Calculated Ke 0.2185, half-life 3.2  hrs 2/12: VP/VT 30/14, AUC 577 on [id number] >> 1g q8 2/17: VP/VT 19/13, AUC 505.6 on 100093m8, ke 0.1198, t1/2 5.8h - cont 1000 mg q8h 2/25: VP/VT 29/13, AUC 531.8 on 1000 mg q8h, ke 0.138, t1/2 5.0h-continue 1000 mg q8h 3/3: VP 26/VT 13, AUC 535 on 1gm IV q8h - Ke 0.146, t 1/2 4.7 hrs. - continue 1gm q8h 3/10: VP 24/VT 10, AUC 471.7 on 1gm IV q8h - Ke 0.169, t 1/2 4.1 hrs. - continue 1gm q8h   2/1 Blood x 2: Enterococcus faecalis (pan-S), Strep sanguinis 1/2 (R-PCN and CTX, S-Vanc) and strep mitis/oralis (R-tetra, I-PCN, S-Vanc/[place]/CTX) 2/1 Urine: E coli - sensitive to cefazolin, ceftriaxone; R - amp, cipro, septra, unasyn, zosyn 2/3 Influenza PCR - positive B 2/3 Blood x 2: negative 2/7 RPR negative 2/7 HIV non-reactive 2/7 Rubella < 0.90 = non-immune 2/13 urine - negative 2/20 MRSA PCR: negative  Thank you for allowing pharmacy to be part of this patients care team.  RutThreasa Alphainical Pharmacist Clinical phone for 07/25/2018 from [phone] check AMION for all [facility] [place] numbers After 10:00 PM, call [place]  Listed under [facility] [place]/13/2020 1:33 PM

## 2018-07-25 NOTE — Progress Notes (Signed)
Daily fetal NST complete. Appropriate for GA. Pt denies vaginal bleeding, LOF, or UC's and reports good fetal movement.

## 2018-07-25 NOTE — Progress Notes (Signed)
PROGRESS NOTE    Kelly [name]  SWN:[id number] DOB: Apr 24, 1986 DOA: 06/14/2018 PCP: [facility]  Brief Narrative:  Kelly [name] is a 33 y.o. 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  Hospital course: On initial presentation patient was afebrile, tachypneic, tachycardic, with leukocytosis with hypotension consistent with sepsis. Her hCG was greater than 2000 and ultrasound showed intrauterine gestation with estimated gestational age of [redacted] weeks.  MRI of back was negative for acute infection.  She was initially started on IV vancomycin and Rocephin and azithromycin in the ED before transition to IV Rocephin due to multifocal pneumonia and Tamiflu for influenza B.  IV ampicillin and IV vancomycin were later added was added to this regimen due to E faecalis and Streptococcus mitis/paralysis bacteremia.  CT surgery was consulted due to worsening vegetation on TTE and severe TR patient is not a surgical candidate patient has been on IV ceftriaxone, IV ampicillin, IV vancomycin for MRSA bacterial endocarditis/bacteremia  **Interm History  Patient states she is still not sleeping well while being hospitalized. Complained to the nurse about a "knot" on her abdomen that is likely a Hematoma from a Lovenox Injection. Continues to be intermittently nauseous.  Assessment & Plan:   Principal Problem:   Bacteremia due to Enterococcus Active Problems:   Opioid use disorder, severe, dependence (HCC)   Sepsis due to pneumonia ([place])    IV drug abuse complicating pregnancy ([place])   Multifocal pneumonia   Hyponatremia   Elevated transaminase level   Degenerative arthritis of lumbar spine   History of bacterial endocarditis   Severe tricuspid regurgitation by prior echocardiogram   Influenza B   Rubella non-immune status, antepartum   High-risk pregnancy in second trimester   Preexisting hypertension complicating pregnancy, antepartum   Dental caries   Phobia of dental procedure   Endocarditis   Infection due to Streptococcus mitis group  Streptococcus oralis/sanguinis/staph aureus bacteremia secondary to tricuspid valve endocarditis.   -Positive blood cultures on 06/14/2018.   -TEE large tricuspid valve vegetation, preserved EF, elevated RVSP, enlarged RV.   -Not a surgical candidate per CT surgery evaluation.  -Blood cultures on 2/3 have remained negative over 5 days -Continue 6 weeks of IV ceftriaxone, IV ampicillin, IV vancomycin, end date 3/22 per ID recommendations to complete in hospital given recent IV drug use -Follow-up with ID arranged  Current Pregnancy -Monitored by obstetric RN with daily fetal NST  Estimated date of delivery 11/01/2018, Plan to initiate routine monitoring once which is viability in 24 weeks, OB recommends avoiding treating BP in pregnancy unless consistently greater than 150/100. Fetal ultrasound wnl on 3/9.  -Continue Prenatal vitamins -C/w Daily fetal NST by obstetric RN; Per Nursing Note on 07/24/2018:    Pt denies vaginal bleeding or LOF and reports good fetal movement. Pt denies UC's.  Dental caries s/p dentral extration (07/03/18) -Dental imaging reviewed dental abscess/dental caries prompting a dental extraction with alveoloplasty on 07/03/2018 by Dentistry.  Healing well per their evaluation postoperatively 3/3. -In future will need antibiotic medication prior to invasive dental procedures given previous endocarditis.  IV Heroin/Amphetamine abuse. -Continue methadone 60  mg po  daily and 40 mg at 1800 Daily (had not been adherent or following pain clinic since 04/2018),  -Will need to re-establish with pain clinic at [facility] -Social Worker making referral to [facility]   Groin rash, very likely yeast infection, improving -Continue Nystatin powder, closely monitor  Sepsis secondary to bacteremia of Streptococcus oralis/mitis and E faecalis secondary to tricuspid valve endocarditis along with Influenza B infection, multifocal pneumonia, resolved -On presentation was hypotensive, tachypneic, tachycardic, white count of 15.9.  -Bilateral airspace opacities on admission chest x-ray.   -No longer having cough.   -Completed antibiotic course with IV ceftriaxone for multifocal pneumonia but remains on IV Ceftriaxone for Bacteremia.  Doing well on room air.   -Still completing IV antibiotic course for endocarditis.  Physiologic anemia of pregnancy required 2 units transfusion for hemoglobin of 6.8 (2/7).   -Has remained stable between 7 and 8 but now worsened ? If spurious Drop from yesterday to today  -Iron panel labs consistent with iron deficiency anemia as iron level was 82, U IBC was 552, TIBC was 634, saturation ratios of 13%, ferritin level is 29 -OB recommends Fere heme infusion x2 (3/10) -Hb/Hct went from 10.1/33.5 -> 7.6/25.6 and ? Spurious drop so was repeated and was 8.8/29.5 and this AM was 9.3/30.5 -Has an RLQ Abdominal Hematoma -Continue to Monitor for S/Sx of Bleeding -Repeat CBC in AM   Back pain, resolved -Given bacteremia and known IV drug use MRI was obtained on admission which was negative for active infection of lumbar spine. -C/w Acetaminophen 650 mg po q6hprn Mild Pain and C/w Ibuprofen 500 mg po q6hprn Moderate Pain; C/w Home Methadone of 60 mg po daily and 40 mg po qHS and  -Continue to Monitor Closely   Influenza B, resolved. -Completed course of Oseltamivir and no longer on droplet precautions -C/w  Benzonatate 200 mg po TIDprn Cough  Dysuria in the setting of E Coli UTI, resolved -Found to have E. coli UTI on 2/1, completed course with IV ceftriaxone.   -Most recent urine culture on 2/13 showed No Growth  Hypomagnesemia -Patient's Mag Level this AM was 1.5 -Replete with IV Mag Sulfate 2 grams again -Continue to Monitor and Replete as Necessary -Repeat Mag Level this AM pending   Hyperglycemia -Patient's blood sugar has been fluctuating -Last HbA1c was 4.9 and will repeat tomorrow AM -Continue to Monitor Blood Sugars carefully and if necessary may place on Sliding Scale Insulin   Hyponatremia -Improved and Na+ is now 135 (yesterday was 13) -Continue to Monitor   Drop in Platelet Count -? Spurious Result -Patient's Platelet Count went from 368 -> 189 but on repeat yesterday was 373 and this AM was 398 -Will repeat this AM -Continue to Monitor and repeat CBC in AM   Abdominal Hematoma -Likely related to Lovenox Injections -Limited sonographic evaluation in the right lower quadrant of the abdomen demonstrates predominantly cystic but complex abnormality measuring 2.9 x 1.7 x 1.6 cm. This may represent hematoma or seroma, but abscess can not be excluded.  -Less Likely Abscess as patient is Afebrile and has now WBC -Noticed this happening since last week and in the setting of Lovenox Injections -Apply Warm Compress  Nausea -Likely Related to Pregnancy and is intermittent -C/w Ondansetron 4 mg IV q6hprn Refractory Nausea and Vomiting and C/w Promethazine 12.5-25 mg IV q6hprn  -Need to be Judicious during pregnancy as Phenergan is Category C  DVT prophylaxis: Enoxaparin 40 mg sq q24h Code Status:  FULL CODE Family Communication: No family present at bedside Disposition Plan: Pending completion of IV Abx  Consultants:   Dentistry  Infectious Diseases  OB-GYN   Procedures:  2/3 TTE  1. Tricuspid regurgitation severe by color flow Doppler. There is a large mobile  tricuspid valve vegetation on the anterior leaflet. The TV vegetation measures 10 mm x 20 mm. 2. The left ventricle has normal systolic function of 03-54%. The cavity size is normal. There is no left ventricular wall thickness. Echo evidence of normal diastolic filling patterns. 3. The right ventricle is moderately enlarged in size. There is normal systolic function. Right ventricular systolic pressure is normal with an estimated pressure of 43.8 mmHg. 4. Mildly dilated left atrial size. 5. Moderately dilated right atrial size. 6. The mitral valve is myxomatous. There is mild thickening. Regurgitation is not visualized by color flow Doppler. 7. Large vegetation on the tricuspid valve. 8. Myxomatous tricuspid valve. 9. Tricuspid regurgitation severe. 10. No atrial level shunt detected by color flow Doppler.  07/03/18 1. Multiple extraction of tooth numbers30 and 31 2.Onequadrants of alveoloplasty   Antimicrobials:  Anti-infectives (From admission, onward)   Start     Dose/Rate Route Frequency Ordered Stop   06/26/18 0100  vancomycin (VANCOCIN) IVPB 1000 mg/200 mL premix     1,000 mg 200 mL/hr over 60 Minutes Intravenous Every 8 hours 06/25/18 1758 08/03/18 2359   06/23/18 0000  vancomycin (VANCOCIN) 1,250 mg in sodium chloride 0.9 % 250 mL IVPB  Status:  Discontinued     1,250 mg 166.7 mL/hr over 90 Minutes Intravenous Every 8 hours 06/22/18 2332 06/25/18 1758   06/20/18 1000  vancomycin (VANCOCIN) 2,000 mg in sodium chloride 0.9 % 500 mL IVPB  Status:  Discontinued     2,000 mg 250 mL/hr over 120 Minutes Intravenous Every 12 hours 06/19/18 2221 06/22/18 2332   06/18/18 2000  vancomycin (VANCOCIN) IVPB 1000 mg/200 mL premix  Status:  Discontinued     1,000 mg 200 mL/hr over 60 Minutes Intravenous Every 8 hours 06/18/18 1054 06/19/18 2221   06/18/18 1200  vancomycin (VANCOCIN) 1,750 mg in sodium chloride 0.9 % 500 mL IVPB     1,750 mg 250 mL/hr over 120 Minutes Intravenous   Once 06/18/18 1054 06/18/18 2241   06/17/18 2200  cefTRIAXone (ROCEPHIN) 2 g in sodium chloride 0.9 % 100 mL IVPB     2 g 200 mL/hr over 30 Minutes Intravenous Every 12 hours 06/17/18 1046 08/04/18 0959   06/17/18 1200  ampicillin (OMNIPEN) 2 g in sodium chloride 0.9 % 100 mL IVPB     2 g 300 mL/hr over 20 Minutes Intravenous Every 4 hours 06/17/18 1045 08/03/18 2359   06/16/18 1300  oseltamivir (TAMIFLU) capsule 75 mg     75 mg Oral 2 times daily 06/16/18 1231 06/20/18 0919   06/15/18 0700  vancomycin (VANCOCIN) IVPB 1000 mg/200 mL premix  Status:  Discontinued     1,000 mg 200 mL/hr over 60 Minutes Intravenous Every 8 hours 06/14/18 2216 06/15/18 0711   06/15/18 0500  cefTRIAXone (ROCEPHIN) 2 g in sodium chloride 0.9 % 100 mL IVPB  Status:  Discontinued     2 g 200 mL/hr over 30 Minutes Intravenous Every 24 hours 06/15/18 0445 06/17/18 1046   06/14/18 2200  azithromycin (ZITHROMAX) 500 mg in sodium chloride 0.9 % 250 mL IVPB  Status:  Discontinued     500 mg 250 mL/hr over 60 Minutes Intravenous Every 24 hours 06/14/18 2134 06/15/18 0711  06/14/18 2200  cefTRIAXone (ROCEPHIN) 1 g in sodium chloride 0.9 % 100 mL IVPB  Status:  Discontinued     1 g 200 mL/hr over 30 Minutes Intravenous Every 24 hours 06/14/18 2134 06/15/18 0445   06/14/18 2200  vancomycin (VANCOCIN) 1,500 mg in sodium chloride 0.9 % 500 mL IVPB     1,500 mg 250 mL/hr over 120 Minutes Intravenous  Once 06/14/18 2134 06/15/18 0235     Subjective: Seen and examined bedside and states that she got a little bit more sleep but was complaining of nausea.  No heaviness or dizziness.  No chest pain.  No other concerns or complaints at this time.  Objective: Vitals:   07/24/18 0843 07/24/18 1728 07/24/18 2300 07/25/18 0831  BP: (!) 114/96 118/67 105/70 100/72  Pulse: 86 91 89 98  Resp: 18 18 19 18   Temp: (!) 97.4 F (36.3 C) 98.3 F (36.8 C) 97.9 F (36.6 C) 97.9 F (36.6 C)  TempSrc: Oral  Oral Oral  SpO2: 99% 97%  96% 99%  Weight:      Height:        Intake/Output Summary (Last 24 hours) at 07/25/2018 1025 Last data filed at 07/24/2018 2201 Gross per 24 hour  Intake 10 ml  Output -  Net 10 ml   Filed Weights   07/22/18 0627 07/23/18 0505 07/24/18 0500  Weight: 82.6 kg 82.6 kg 82.6 kg   Examination: Physical Exam:  Constitutional: Well-nourished, well-developed pregnant Caucasian female currently no acute distress laying in bed wanting to rest and complaining of some nausea Eyes: Sclera anicteric.  Lids and conjunctive are normal ENMT: External ears and nose appear normal.  Grossly normal hearing.  Patient has poor dentition Neck: Appears supple no JVD Respiratory: Mildly diminished auscultation bilaterally with no appreciable wheezing, rales, rhonchi.  Patient was not tachypneic or using any accessory muscles to breathe Cardiovascular: Regular rate and rhythm.  No appreciable murmurs, rubs, gallops.  No lower extremity edema noted Abdomen: Soft, nontender, distended secondary pregnancy and does have an area of induration in the right lower quadrant consistent with a hematoma from a Lovenox injection.  Bowel sounds are positive GU: Deferred Musculoskeletal: No contractures or cyanosis.  No joint deformity noted Skin: Skin is warm and dry no appreciable rashes or lesions on limited skin evaluation.  Does have some skin tattoos Neurologic: Cranial nerves II through XII grossly intact no appreciable focal deficits Psychiatric: Normal judgment and insight.  Patient is awake, alert and oriented x3  Data Reviewed: I have personally reviewed following labs and imaging studies  CBC: Recent Labs  Lab 07/22/18 0800 07/23/18 0839 07/24/18 0639 07/24/18 1331 07/25/18 0400  WBC 6.4 7.1 5.7 5.9 6.9  NEUTROABS  --  5.4 3.9 4.3 4.7  HGB 9.2* 10.1* 7.6* 8.8* 9.3*  HCT 29.6* 33.5* 25.6* 29.5* 30.5*  MCV 89.4 89.6 91.1 91.3 89.7  PLT 368 368 189 373 322   Basic Metabolic Panel: Recent Labs  Lab  07/22/18 0800 07/23/18 0839 07/24/18 1331 07/25/18 0400  NA 133* 135 137 135  K 3.8 3.7 3.0* 3.9  CL 99 101 109 101  CO2 25 25 20* 25  GLUCOSE 96 105* 92 86  BUN 7 8 8 10   CREATININE 0.39* 0.48 0.38* 0.43*  CALCIUM 8.9 9.3 7.4* 9.0  MG  --  1.6* 1.3* 1.5*  PHOS  --  3.8 3.4 4.3   GFR: Estimated Creatinine Clearance: 110.5 mL/min (A) (by C-G formula based on SCr of 0.43  mg/dL (L)). Liver Function Tests: Recent Labs  Lab 07/23/18 0839 07/24/18 1331 07/25/18 0400  AST 28 26 24   ALT 19 16 16   ALKPHOS 192* 158* 160*  BILITOT 0.9 0.6 0.7  PROT 6.8 5.9* 6.2*  ALBUMIN 2.4* 2.0* 2.2*   No results for input(s): LIPASE, AMYLASE in the last 168 hours. No results for input(s): AMMONIA in the last 168 hours. Coagulation Profile: No results for input(s): INR, PROTIME in the last 168 hours. Cardiac Enzymes: No results for input(s): CKTOTAL, CKMB, CKMBINDEX, TROPONINI in the last 168 hours. BNP (last 3 results) No results for input(s): PROBNP in the last 8760 hours. HbA1C: Recent Labs    07/25/18 0400  HGBA1C 4.9   CBG: No results for input(s): GLUCAP in the last 168 hours. Lipid Profile: No results for input(s): CHOL, HDL, LDLCALC, TRIG, CHOLHDL, LDLDIRECT in the last 72 hours. Thyroid Function Tests: No results for input(s): TSH, T4TOTAL, FREET4, T3FREE, THYROIDAB in the last 72 hours. Anemia Panel: No results for input(s): VITAMINB12, FOLATE, FERRITIN, TIBC, IRON, RETICCTPCT in the last 72 hours. Sepsis Labs: No results for input(s): PROCALCITON, LATICACIDVEN in the last 168 hours.  No results found for this or any previous visit (from the past 240 hour(s)).   Radiology Studies: US Abdomen Limited  Result Date: 07/23/2018 CLINICAL DATA:  Palpable abnormality seen in right lower quadrant. Reportedly former injection site. EXAM: ULTRASOUND ABDOMEN LIMITED COMPARISON:  None. FINDINGS: Limited sonographic evaluation in the right lower quadrant of the abdomen demonstrates  predominantly cystic but complex abnormality measuring 2.9 x 1.7 x 1.6 cm. This may represent hematoma or seroma, but abscess can not be excluded. Clinical correlation is recommended. IMPRESSION: 2.9 x 1.7 x 1.6 cm complex but predominantly cystic abnormality seen in area of palpable concern in right lower quadrant of abdomen. This may represent hematoma or seroma, but clinical correlation is recommended to rule out abscess. Electronically Signed   By: Marijo Conception, M.D.   On: 07/23/2018 15:57    Scheduled Meds: . chlorhexidine  15 mL Mouth/Throat BID  . enoxaparin (LOVENOX) injection  40 mg Subcutaneous Q24H  . feeding supplement (ENSURE ENLIVE)  237 mL Oral BID BM  . methadone  40 mg Oral q1800  . methadone  60 mg Oral Daily  . nystatin   Topical BID  . pantoprazole  40 mg Oral BID  . prenatal multivitamin  1 tablet Oral Q1200  . sodium chloride flush  10-40 mL Intracatheter Q12H   Continuous Infusions: . sodium chloride 10 mL/hr at 07/19/18 0400  . ampicillin (OMNIPEN) IV 2 g (07/25/18 0928)  . cefTRIAXone (ROCEPHIN)  IV 2 g (07/24/18 2309)  . ferumoxytol 510 mg (07/22/18 2042)  . lactated ringers 10 mL/hr at 07/03/18 2101  . magnesium sulfate 1 - 4 g bolus IVPB    . vancomycin 1,000 mg (07/25/18 0927)    LOS: 40 days   Kerney Elbe, DO [facility] PAGER is on [place]  If 7PM-7AM, please contact night-coverage [email or web address] Password [facility] 07/25/2018, 10:25 AM

## 2018-07-26 ENCOUNTER — Inpatient Hospital Stay (HOSPITAL_COMMUNITY): Payer: Medicaid Other

## 2018-07-26 DIAGNOSIS — Z3A20 20 weeks gestation of pregnancy: Secondary | ICD-10-CM

## 2018-07-26 DIAGNOSIS — F40232 Fear of other medical care: Secondary | ICD-10-CM

## 2018-07-26 DIAGNOSIS — Z452 Encounter for adjustment and management of vascular access device: Secondary | ICD-10-CM

## 2018-07-26 DIAGNOSIS — M545 Low back pain, unspecified: Secondary | ICD-10-CM

## 2018-07-26 DIAGNOSIS — R140 Abdominal distension (gaseous): Secondary | ICD-10-CM

## 2018-07-26 DIAGNOSIS — T148XXA Other injury of unspecified body region, initial encounter: Secondary | ICD-10-CM

## 2018-07-26 DIAGNOSIS — O0992 Supervision of high risk pregnancy, unspecified, second trimester: Secondary | ICD-10-CM

## 2018-07-26 LAB — CBC WITH DIFFERENTIAL/PLATELET
Abs Immature Granulocytes: 0.03 10*3/uL (ref 0.00–0.07)
Basophils Absolute: 0 10*3/uL (ref 0.0–0.1)
Basophils Relative: 1 %
Eosinophils Absolute: 0.1 10*3/uL (ref 0.0–0.5)
Eosinophils Relative: 2 %
HCT: 31.9 % — ABNORMAL LOW (ref 36.0–46.0)
Hemoglobin: 9.6 g/dL — ABNORMAL LOW (ref 12.0–15.0)
Immature Granulocytes: 0 %
LYMPHS ABS: 1.7 10*3/uL (ref 0.7–4.0)
Lymphocytes Relative: 23 %
MCH: 27 pg (ref 26.0–34.0)
MCHC: 30.1 g/dL (ref 30.0–36.0)
MCV: 89.6 fL (ref 80.0–100.0)
Monocytes Absolute: 0.5 10*3/uL (ref 0.1–1.0)
Monocytes Relative: 7 %
NRBC: 0 % (ref 0.0–0.2)
Neutro Abs: 4.9 10*3/uL (ref 1.7–7.7)
Neutrophils Relative %: 67 %
Platelets: 415 10*3/uL — ABNORMAL HIGH (ref 150–400)
RBC: 3.56 MIL/uL — ABNORMAL LOW (ref 3.87–5.11)
RDW: 16.9 % — ABNORMAL HIGH (ref 11.5–15.5)
WBC: 7.2 10*3/uL (ref 4.0–10.5)

## 2018-07-26 LAB — MAGNESIUM: Magnesium: 1.7 mg/dL (ref 1.7–2.4)

## 2018-07-26 MED ORDER — GERHARDT'S BUTT CREAM
TOPICAL_CREAM | Freq: Two times a day (BID) | CUTANEOUS | Status: DC
  Administered 2018-07-26 – 2018-07-30 (×10): via TOPICAL
  Administered 2018-07-31: 1 via TOPICAL
  Administered 2018-07-31 – 2018-08-03 (×7): via TOPICAL
  Administered 2018-08-04: 1 via TOPICAL
  Filled 2018-07-26 (×2): qty 1

## 2018-07-26 NOTE — Progress Notes (Signed)
FHR 145 BPM, min variability, 15x15 accel, no decels, no uc's. Pt says she feels the baby move. She denies vaginal bleeding or leaking of fluid, or feeling uc's. Dr. Rosana Hoes updated.

## 2018-07-26 NOTE — Progress Notes (Signed)
PROGRESS NOTE    Kelly [name]  NAT:[id number] DOB: [birth year] DOA: 06/14/2018 PCP: [facility]  Brief Narrative:  Kelly [name] is a 33 y.o. 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  Hospital course: On initial presentation patient was afebrile, tachypneic, tachycardic, with leukocytosis with hypotension consistent with sepsis. Her hCG was greater than 2000 and ultrasound showed intrauterine gestation with estimated gestational age of [redacted] weeks.  MRI of back was negative for acute infection.  She was initially started on IV vancomycin and Rocephin and azithromycin in the ED before transition to IV Rocephin due to multifocal pneumonia and Tamiflu for influenza B.  IV ampicillin and IV vancomycin were later added was added to this regimen due to E faecalis and Streptococcus mitis/paralysis bacteremia.  CT surgery was consulted due to worsening vegetation on TTE and severe TR patient is not a surgical candidate patient has been on IV ceftriaxone, IV ampicillin, IV vancomycin for MRSA bacterial endocarditis/bacteremia  **Interm History  Patient states she is still not sleeping well while being hospitalized. Complained to the nurse about a "knot" on her abdomen that is likely a Hematoma from a Lovenox Injection. Continues to be intermittently nauseous and overnight (3/13-3/14) PICC line was pulled out a little and fixed by IV Team.   Assessment & Plan:   Principal Problem:   Bacteremia due to Enterococcus Active Problems:    Opioid use disorder, severe, dependence (HCC)   Sepsis due to pneumonia ([place])   IV drug abuse complicating pregnancy ([place])   Multifocal pneumonia   Hyponatremia   Elevated transaminase level   Degenerative arthritis of lumbar spine   History of bacterial endocarditis   Severe tricuspid regurgitation by prior echocardiogram   Influenza B   Rubella non-immune status, antepartum   High-risk pregnancy in second trimester   Preexisting hypertension complicating pregnancy, antepartum   Dental caries   Phobia of dental procedure   Endocarditis   Infection due to Streptococcus mitis group  Streptococcus oralis/sanguinis/staph aureus bacteremia secondary to tricuspid valve endocarditis.   -Positive blood cultures on 06/14/2018.   -TEE large tricuspid valve vegetation, preserved EF, elevated RVSP, enlarged RV.   -Not a surgical candidate per CT surgery evaluation.  -Blood cultures on 2/3 have remained negative over 5 days -Continue 6 weeks of IV ceftriaxone, IV ampicillin, IV vancomycin, end date 3/22 per ID recommendations to complete in hospital given recent IV drug use -Follow-up with ID arranged  Current Pregnancy -Monitored by obstetric RN with daily fetal NST  Estimated date of delivery 11/01/2018, Plan to initiate routine monitoring once which is viability in 24 weeks, OB recommends avoiding treating BP in pregnancy unless consistently greater than 150/100. Fetal ultrasound wnl on 3/9.  -Continue Prenatal vitamins -C/w Daily fetal NST by obstetric RN; Per Nursing Note on 07/24/2018:  "Appropriate for GA. Pt denies vaginal bleeding, LOF, or UC's and reports good fetal movement."  Dental caries s/p dentral extration (07/03/18) -Dental imaging reviewed dental abscess/dental caries prompting a dental extraction with alveoloplasty on 07/03/2018 by Dentistry.  Healing well per their evaluation postoperatively 3/3. -In future will need antibiotic medication  prior to invasive dental procedures  given previous endocarditis.  IV Heroin/Amphetamine abuse. -Continue methadone 60 mg po daily and 40 mg at 1800 Daily (had not been adherent or following pain clinic since 04/2018),  -Will need to re-establish with pain clinic at [facility] -Social Worker making referral to [facility]   Groin rash, very likely yeast infection, improving -Continue Nystatin powder, closely monitor  Sepsis secondary to bacteremia of Streptococcus oralis/mitis and E faecalis secondary to tricuspid valve endocarditis along with Influenza B infection, multifocal pneumonia, resolved -On presentation was hypotensive, tachypneic, tachycardic, white count of 15.9.; WBC this AM was 7.2 -Bilateral airspace opacities on admission chest x-ray.   -No longer having cough.   -Completed antibiotic course with IV ceftriaxone for multifocal pneumonia but remains on IV Ceftriaxone for Bacteremia.  Doing well on room air.   -Still completing IV antibiotic course for endocarditis.  Physiologic anemia of pregnancy required 2 units transfusion for hemoglobin of 6.8 (2/7).   -Has remained stable between 7 and 8 but now worsened ? If spurious Drop from yesterday to today  -Iron panel labs consistent with iron deficiency anemia as iron level was 82, U IBC was 552, TIBC was 634, saturation ratios of 13%, ferritin level is 29 -OB recommends Fere heme infusion x2 (3/10) -Hb/Hct went from 10.1/33.5 -> 7.6/25.6 and ? Spurious drop so was repeated and was 8.8/29.5 and this AM was 9.6/31.9 -Has an RLQ Abdominal Hematoma -Continue to Monitor for S/Sx of Bleeding -Repeat CBC intermittently   Back pain, resolved -Given bacteremia and known IV drug use MRI was obtained on admission which was negative for active infection of lumbar spine. -C/w Acetaminophen 650 mg po q6hprn Mild Pain and C/w Ibuprofen 500 mg po q6hprn Moderate Pain; C/w Home Methadone of 60 mg po daily and 40 mg po qHS and  -Continue to Monitor  Closely   Influenza B, resolved. -Completed course of Oseltamivir and no longer on droplet precautions -C/w Benzonatate 200 mg po TIDprn Cough  Dysuria in the setting of E Coli UTI, resolved -Found to have E. coli UTI on 2/1, completed course with IV ceftriaxone.   -Most recent urine culture on 2/13 showed No Growth  Hypomagnesemia -Patient's Mag Level this AM was 1.7 -Continue to Monitor and Replete as Necessary -Repeat Mag Level this AM pending   Hyperglycemia -Patient's blood sugar has been fluctuating and likley reactive -HbA1c was 4.9 -Continue to Monitor Blood Sugars carefully and if necessary may place on Sliding Scale Insulin   Hyponatremia -Improved and last Na+ was 135 -Continue to Monitor intermittently   Drop in Platelet Count -? Spurious Result -Patient's Platelet Count went from 368 -> 189 but on repeat yesterday was 373 and this AM was 398 -Will repeat this AM -Continue to Monitor and repeat CBC in AM   Abdominal Hematoma -Likely related to Lovenox Injections -Limited sonographic evaluation in the right lower quadrant of the abdomen demonstrates predominantly cystic but complex abnormality measuring 2.9 x 1.7 x 1.6 cm. This may represent hematoma or seroma, but abscess can not be excluded.  -Less Likely Abscess as patient is Afebrile and has now WBC -Noticed this happening since last week and in the setting of Lovenox Injections -Apply Warm Compress Daily   Nausea, stable -Likely Related to Pregnancy and is intermittent -C/w Ondansetron 4 mg IV q6hprn Refractory Nausea and Vomiting and C/w Promethazine 12.5-25 mg IV q6hprn  -Need to be Judicious during pregnancy as Phenergan is Category C  DVT  prophylaxis: Enoxaparin 40 mg sq q24h Code Status: FULL CODE Family Communication: No family present at bedside Disposition Plan: Pending completion of IV Abx  Consultants:   Dentistry  Infectious Diseases  OB-GYN   Procedures:  2/3 TTE  1. Tricuspid  regurgitation severe by color flow Doppler. There is a large mobile tricuspid valve vegetation on the anterior leaflet. The TV vegetation measures 10 mm x 20 mm. 2. The left ventricle has normal systolic function of 75-91%. The cavity size is normal. There is no left ventricular wall thickness. Echo evidence of normal diastolic filling patterns. 3. The right ventricle is moderately enlarged in size. There is normal systolic function. Right ventricular systolic pressure is normal with an estimated pressure of 43.8 mmHg. 4. Mildly dilated left atrial size. 5. Moderately dilated right atrial size. 6. The mitral valve is myxomatous. There is mild thickening. Regurgitation is not visualized by color flow Doppler. 7. Large vegetation on the tricuspid valve. 8. Myxomatous tricuspid valve. 9. Tricuspid regurgitation severe. 10. No atrial level shunt detected by color flow Doppler.  07/03/18 1. Multiple extraction of tooth numbers30 and 31 2.Onequadrants of alveoloplasty   Antimicrobials:  Anti-infectives (From admission, onward)   Start     Dose/Rate Route Frequency Ordered Stop   06/26/18 0100  vancomycin (VANCOCIN) IVPB 1000 mg/200 mL premix     1,000 mg 200 mL/hr over 60 Minutes Intravenous Every 8 hours 06/25/18 1758 08/03/18 2359   06/23/18 0000  vancomycin (VANCOCIN) 1,250 mg in sodium chloride 0.9 % 250 mL IVPB  Status:  Discontinued     1,250 mg 166.7 mL/hr over 90 Minutes Intravenous Every 8 hours 06/22/18 2332 06/25/18 1758   06/20/18 1000  vancomycin (VANCOCIN) 2,000 mg in sodium chloride 0.9 % 500 mL IVPB  Status:  Discontinued     2,000 mg 250 mL/hr over 120 Minutes Intravenous Every 12 hours 06/19/18 2221 06/22/18 2332   06/18/18 2000  vancomycin (VANCOCIN) IVPB 1000 mg/200 mL premix  Status:  Discontinued     1,000 mg 200 mL/hr over 60 Minutes Intravenous Every 8 hours 06/18/18 1054 06/19/18 2221   06/18/18 1200  vancomycin (VANCOCIN) 1,750 mg in sodium chloride 0.9  % 500 mL IVPB     1,750 mg 250 mL/hr over 120 Minutes Intravenous  Once 06/18/18 1054 06/18/18 2241   06/17/18 2200  cefTRIAXone (ROCEPHIN) 2 g in sodium chloride 0.9 % 100 mL IVPB     2 g 200 mL/hr over 30 Minutes Intravenous Every 12 hours 06/17/18 1046 08/04/18 0959   06/17/18 1200  ampicillin (OMNIPEN) 2 g in sodium chloride 0.9 % 100 mL IVPB     2 g 300 mL/hr over 20 Minutes Intravenous Every 4 hours 06/17/18 1045 08/03/18 2359   06/16/18 1300  oseltamivir (TAMIFLU) capsule 75 mg     75 mg Oral 2 times daily 06/16/18 1231 06/20/18 0919   06/15/18 0700  vancomycin (VANCOCIN) IVPB 1000 mg/200 mL premix  Status:  Discontinued     1,000 mg 200 mL/hr over 60 Minutes Intravenous Every 8 hours 06/14/18 2216 06/15/18 0711   06/15/18 0500  cefTRIAXone (ROCEPHIN) 2 g in sodium chloride 0.9 % 100 mL IVPB  Status:  Discontinued     2 g 200 mL/hr over 30 Minutes Intravenous Every 24 hours 06/15/18 0445 06/17/18 1046   06/14/18 2200  azithromycin (ZITHROMAX) 500 mg in sodium chloride 0.9 % 250 mL IVPB  Status:  Discontinued     500 mg 250 mL/hr over 60 Minutes  Intravenous Every 24 hours 06/14/18 2134 06/15/18 0711   06/14/18 2200  cefTRIAXone (ROCEPHIN) 1 g in sodium chloride 0.9 % 100 mL IVPB  Status:  Discontinued     1 g 200 mL/hr over 30 Minutes Intravenous Every 24 hours 06/14/18 2134 06/15/18 0445   06/14/18 2200  vancomycin (VANCOCIN) 1,500 mg in sodium chloride 0.9 % 500 mL IVPB     1,500 mg 250 mL/hr over 120 Minutes Intravenous  Once 06/14/18 2134 06/15/18 0235     Subjective: Seen and examined bedside and states he is feeling okay.  Denies any nausea, vomiting or chest pain this morning.  Had a PICC line partly removed yesterday but was reinserted InFix.  No other concerns or complaints at this time.  Objective: Vitals:   07/25/18 0831 07/25/18 1707 07/25/18 2308 07/26/18 0752  BP: 100/72 109/74 119/78 (!) 104/59  Pulse: 98 98 94 87  Resp: 18 17 18 15   Temp: 97.9 F (36.6 C)   97.7 F (36.5 C)   TempSrc: Oral  Oral   SpO2: 99% 98% 97% 95%  Weight:      Height:        Intake/Output Summary (Last 24 hours) at 07/26/2018 1051 Last data filed at 07/26/2018 0511 Gross per 24 hour  Intake 6133.07 ml  Output -  Net 6133.07 ml   Filed Weights   07/22/18 0627 07/23/18 0505 07/24/18 0500  Weight: 82.6 kg 82.6 kg 82.6 kg   Examination: Physical Exam:  Constitutional: Well-nourished, well-developed Caucasian female currently no acute distress Respiratory: Clear to auscultation bilaterally no appreciable wheezing, rales, rhonchi. Cardiovascular: Good rate and rhythm.  No appreciable murmurs, rubs, gallops. Abdomen: Soft, nontender, distended secondary to pregnancy.  Area of induration in the right lower quadrant consistent with a hematoma GU: Deferred Psychiatric: Pleasant Mood and affect.  Intact judgment and insight.  Data Reviewed: I have personally reviewed following labs and imaging studies  CBC: Recent Labs  Lab 07/23/18 0839 07/24/18 0639 07/24/18 1331 07/25/18 0400 07/26/18 0500  WBC 7.1 5.7 5.9 6.9 7.2  NEUTROABS 5.4 3.9 4.3 4.7 4.9  HGB 10.1* 7.6* 8.8* 9.3* 9.6*  HCT 33.5* 25.6* 29.5* 30.5* 31.9*  MCV 89.6 91.1 91.3 89.7 89.6  PLT 368 189 373 398 336*   Basic Metabolic Panel: Recent Labs  Lab 07/22/18 0800 07/23/18 0839 07/24/18 1331 07/25/18 0400 07/26/18 0500  NA 133* 135 137 135  --   K 3.8 3.7 3.0* 3.9  --   CL 99 101 109 101  --   CO2 25 25 20* 25  --   GLUCOSE 96 105* 92 86  --   BUN 7 8 8 10   --   CREATININE 0.39* 0.48 0.38* 0.43*  --   CALCIUM 8.9 9.3 7.4* 9.0  --   MG  --  1.6* 1.3* 1.5* 1.7  PHOS  --  3.8 3.4 4.3  --    GFR: Estimated Creatinine Clearance: 110.5 mL/min (A) (by C-G formula based on SCr of 0.43 mg/dL (L)). Liver Function Tests: Recent Labs  Lab 07/23/18 0839 07/24/18 1331 07/25/18 0400  AST 28 26 24   ALT 19 16 16   ALKPHOS 192* 158* 160*  BILITOT 0.9 0.6 0.7  PROT 6.8 5.9* 6.2*  ALBUMIN 2.4*  2.0* 2.2*   No results for input(s): LIPASE, AMYLASE in the last 168 hours. No results for input(s): AMMONIA in the last 168 hours. Coagulation Profile: No results for input(s): INR, PROTIME in the last 168 hours. Cardiac Enzymes:  No results for input(s): CKTOTAL, CKMB, CKMBINDEX, TROPONINI in the last 168 hours. BNP (last 3 results) No results for input(s): PROBNP in the last 8760 hours. HbA1C: Recent Labs    07/25/18 0400  HGBA1C 4.9   CBG: No results for input(s): GLUCAP in the last 168 hours. Lipid Profile: No results for input(s): CHOL, HDL, LDLCALC, TRIG, CHOLHDL, LDLDIRECT in the last 72 hours. Thyroid Function Tests: No results for input(s): TSH, T4TOTAL, FREET4, T3FREE, THYROIDAB in the last 72 hours. Anemia Panel: No results for input(s): VITAMINB12, FOLATE, FERRITIN, TIBC, IRON, RETICCTPCT in the last 72 hours. Sepsis Labs: No results for input(s): PROCALCITON, LATICACIDVEN in the last 168 hours.  No results found for this or any previous visit (from the past 240 hour(s)).   Radiology Studies: Dg Chest Port 1 View  Result Date: 07/26/2018 CLINICAL DATA:  PICC line placement. Patient is pregnant. EXAM: PORTABLE CHEST 1 VIEW COMPARISON:  06/14/2018 FINDINGS: A right PICC line has been placed with tip over the upper SVC region. No pneumothorax. Linear infiltrates in the lung bases with peribronchial thickening suggesting pneumonia and bronchitis. Cardiac enlargement without vascular congestion. Postoperative changes in the thoracic spine. IMPRESSION: Right PICC line placed with tip over the upper SVC region. No pneumothorax. Linear infiltrates and peribronchial thickening in the lung bases suggesting pneumonia and bronchitis. Electronically Signed   By: Lucienne Capers M.D.   On: 07/26/2018 02:26    Scheduled Meds: . chlorhexidine  15 mL Mouth/Throat BID  . enoxaparin (LOVENOX) injection  40 mg Subcutaneous Q24H  . feeding supplement (ENSURE ENLIVE)  237 mL Oral BID BM   . methadone  40 mg Oral q1800  . methadone  60 mg Oral Daily  . nystatin   Topical BID  . pantoprazole  40 mg Oral BID  . prenatal multivitamin  1 tablet Oral Q1200  . sodium chloride flush  10-40 mL Intracatheter Q12H   Continuous Infusions: . sodium chloride 10 mL/hr at 07/19/18 0400  . ampicillin (OMNIPEN) IV 2 g (07/26/18 0807)  . cefTRIAXone (ROCEPHIN)  IV 2 g (07/26/18 1049)  . ferumoxytol 510 mg (07/22/18 2042)  . lactated ringers 10 mL/hr at 07/03/18 2101  . vancomycin 1,000 mg (07/26/18 0808)    LOS: 65 days   Kerney Elbe, DO [facility] PAGER is on [place]  If 7PM-7AM, please contact night-coverage [email or web address] Password [place] 07/26/2018, 10:51 AM

## 2018-07-26 NOTE — Progress Notes (Signed)
PICC line may have become exposed during aseptic dressing change. IV team consulted and Physician contacted for order of bedside xray to verify placement. Both lumens have been disconnected, flushed and clamped. Pt is resting.

## 2018-07-26 NOTE — Progress Notes (Signed)
R DL PICC line pulled out approximately 4.5cm during dressing change by unit RN. CXR confirmed PICC tip remains in upper SVC per Dr. Lucienne Capers. Site care and dressing change done per protocol. Both lumens flushed with good blood return. Bedside RN to connect ivfs.

## 2018-07-27 MED ORDER — VANCOMYCIN HCL 1000 MG IV SOLR
1000.0000 mg | Freq: Three times a day (TID) | INTRAVENOUS | Status: DC
  Administered 2018-07-28: 1000 mg via INTRAVENOUS
  Filled 2018-07-27 (×3): qty 1000

## 2018-07-27 NOTE — Progress Notes (Signed)
PROGRESS NOTE    Kelly [name]  GMW:[id number] DOB: 02-Jan-1986 DOA: 06/14/2018 PCP: [facility]  Brief Narrative:  Kelly [name] is a 33 y.o. 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  Hospital course: On initial presentation patient was afebrile, tachypneic, tachycardic, with leukocytosis with hypotension consistent with sepsis. Her hCG was greater than 2000 and ultrasound showed intrauterine gestation with estimated gestational age of [redacted] weeks.  MRI of back was negative for acute infection.  She was initially started on IV vancomycin and Rocephin and azithromycin in the ED before transition to IV Rocephin due to multifocal pneumonia and Tamiflu for influenza B.  IV ampicillin and IV vancomycin were later added was added to this regimen due to E faecalis and Streptococcus mitis/paralysis bacteremia.  CT surgery was consulted due to worsening vegetation on TTE and severe TR patient is not a surgical candidate patient has been on IV ceftriaxone, IV ampicillin, IV vancomycin for MRSA bacterial endocarditis/bacteremia  **Interm History  Patient states she is still not sleeping well while being hospitalized. Complained to the nurse about a "knot" on her abdomen that is likely a Hematoma from a Lovenox Injection. She feels that it is improving and getting smaller with warm compresses. Continues to be intermittently nauseous and on the night og 3/13-3/14PICC line was pulled out a little and fixed by IV Team.   Assessment & Plan:    Principal Problem:   Bacteremia due to Enterococcus Active Problems:   Opioid use disorder, severe, dependence (HCC)   Sepsis due to pneumonia ([place])   IVDU (intravenous drug user)   Multifocal pneumonia   Hyponatremia   Elevated transaminase level   Degenerative arthritis of lumbar spine   History of bacterial endocarditis   Severe tricuspid regurgitation by prior echocardiogram   Influenza B   Rubella non-immune status, antepartum   High-risk pregnancy in second trimester   Preexisting hypertension complicating pregnancy, antepartum   Dental caries   Phobia of dental procedure   Endocarditis   Infection due to Streptococcus mitis group   [redacted] weeks gestation of pregnancy   Abdominal distention   Acute bilateral low back pain   Hematoma   PICC (peripherally inserted central catheter) flush  Streptococcus oralis/sanguinis/staph aureus bacteremia secondary to tricuspid valve endocarditis.   -Positive blood cultures on 06/14/2018.   -TEE large tricuspid valve vegetation, preserved EF, elevated RVSP, enlarged RV.   -Not a surgical candidate per CT surgery evaluation.  -Blood cultures on 2/3 have remained negative over 5 days -Continue 6 weeks of IV ceftriaxone, IV ampicillin, IV vancomycin, end date 3/22 per ID recommendations to complete in hospital given recent IV drug use -Follow-up with ID arranged  Current Pregnancy -Monitored by obstetric RN with daily fetal NST  Estimated date of delivery 11/01/2018, Plan to initiate routine monitoring once which is viability in 24 weeks, OB recommends avoiding treating BP in pregnancy unless consistently greater than 150/100. Fetal ultrasound wnl on 3/9.  -Continue Prenatal vitamins -C/w Daily fetal NST by obstetric RN; Per Nursing Note on 07/24/2018:  "FHR 145 BPM, min variability, 15x15 accel, no decels, no uc's.  Pt says she feels the baby move. She denies vaginal bleeding or leaking of fluid, or feeling uc's"  Dental caries s/p dentral  extration (07/03/18) -Dental imaging reviewed dental abscess/dental caries prompting a dental extraction with alveoloplasty on 07/03/2018 by Dentistry.  Healing well per their evaluation postoperatively 3/3. -In future will need antibiotic medication prior to invasive dental procedures given previous endocarditis.  IV Heroin/Amphetamine Abuse. -Continue methadone 60 mg po daily and 40 mg at 1800 Daily (had not been adherent or following pain clinic since 04/2018),  -Will need to re-establish with pain clinic at D/C -Social Worker making referral to [facility]   Groin rash, very likely yeast infection, improving -Continue Nystatin powder, closely monitor -Added Gerhardt's Butt Cream  Sepsis secondary to bacteremia of Streptococcus oralis/mitis and E faecalis secondary to tricuspid valve endocarditis along with Influenza B infection, multifocal pneumonia, resolved -On presentation was hypotensive, tachypneic, tachycardic, white count of 15.9.; WBC yesterday AM was 7.2 -Bilateral airspace opacities on admission chest x-ray.   -No longer having cough.   -Completed antibiotic course with IV ceftriaxone for multifocal pneumonia but remains on IV Ceftriaxone for Bacteremia.  Doing well on room air.   -Still completing IV antibiotic course for endocarditis.  Physiologic anemia of pregnancy required 2 units transfusion for hemoglobin of 6.8 (2/7).   -Has remained stable between 7 and 8 but now worsened ? If spurious Drop from yesterday to today  -Iron panel labs consistent with iron deficiency anemia as iron level was 82, U IBC was 552, TIBC was 634, saturation ratios of 13%, ferritin level is 29 -OB recommends Fere heme infusion x2 (3/10) -Hb/Hct went from 10.1/33.5 -> 7.6/25.6 and ? Spurious drop so was repeated and was 8.8/29.5 and yesterday AM was 9.6/31.9 -Has an RLQ Abdominal Hematoma -Continue to Monitor for S/Sx of Bleeding -Repeat CBC intermittently    Back pain, resolved -Given bacteremia and known IV drug use MRI was obtained on admission which was negative for active infection of lumbar spine. -C/w Acetaminophen 650 mg po q6hprn Mild Pain and C/w Ibuprofen 500 mg po q6hprn Moderate Pain; C/w Home Methadone of 60 mg po daily and 40 mg po qHS and  -Continue to Monitor Closely   Influenza B, resolved. -Completed course of Oseltamivir and no longer on droplet precautions -C/w Benzonatate 200 mg po TIDprn Cough  Dysuria in the setting of E Coli UTI, resolved -Found to have E. coli UTI on 2/1, completed course with IV ceftriaxone.   -Most recent urine culture on 2/13 showed No Growth  Hypomagnesemia -Patient's Mag Level yesterday AM was 1.7 -Continue to Monitor and Replete as Necessary -Repeat Mag Level this AM pending   Hyperglycemia -Patient's blood sugar has been fluctuating and likley reactive -HbA1c was 4.9 -Continue to Monitor Blood Sugars carefully and if necessary may place on Sliding Scale Insulin   Hyponatremia -Improved and last Na+ was 135 -Continue to Monitor intermittently   Drop in Platelet Count -? Spurious Result -Patient's Platelet Count went from 368 -> 189 but on repeat yesterday was 373 and yesterday AM was 398 -Continue to Monitor and repeat CBC intermittently   Abdominal Hematoma -Likely related to Lovenox Injections -Limited sonographic evaluation in the right lower quadrant of the abdomen demonstrates predominantly cystic but complex abnormality measuring 2.9 x 1.7 x 1.6 cm. This may represent hematoma or seroma, but abscess can not be excluded.  -Less Likely Abscess as patient is Afebrile and has now WBC -Noticed this happening since last  week and in the setting of Lovenox Injections -Apply Warm Compress Daily and she feels that this has been helping  Nausea, stable -Likely Related to Pregnancy and is intermittent and she states that it is "all the time." -C/w Ondansetron 4 mg IV q6hprn Refractory  Nausea and Vomiting and C/w Promethazine 12.5-25 mg IV q6hprn  -Need to be Judicious during pregnancy as Phenergan is Category C  DVT prophylaxis: Enoxaparin 40 mg sq q24h Code Status: FULL CODE Family Communication: No family present at bedside Disposition Plan: Pending completion of IV Abx  Consultants:   Dentistry  Infectious Diseases  OB-GYN   Procedures:  2/3 TTE  1. Tricuspid regurgitation severe by color flow Doppler. There is a large mobile tricuspid valve vegetation on the anterior leaflet. The TV vegetation measures 10 mm x 20 mm. 2. The left ventricle has normal systolic function of 40-98%. The cavity size is normal. There is no left ventricular wall thickness. Echo evidence of normal diastolic filling patterns. 3. The right ventricle is moderately enlarged in size. There is normal systolic function. Right ventricular systolic pressure is normal with an estimated pressure of 43.8 mmHg. 4. Mildly dilated left atrial size. 5. Moderately dilated right atrial size. 6. The mitral valve is myxomatous. There is mild thickening. Regurgitation is not visualized by color flow Doppler. 7. Large vegetation on the tricuspid valve. 8. Myxomatous tricuspid valve. 9. Tricuspid regurgitation severe. 10. No atrial level shunt detected by color flow Doppler.  07/03/18 1. Multiple extraction of tooth numbers30 and 31 2.Onequadrants of alveoloplasty   Antimicrobials:  Anti-infectives (From admission, onward)   Start     Dose/Rate Route Frequency Ordered Stop   06/26/18 0100  vancomycin (VANCOCIN) IVPB 1000 mg/200 mL premix     1,000 mg 200 mL/hr over 60 Minutes Intravenous Every 8 hours 06/25/18 1758 08/03/18 2359   06/23/18 0000  vancomycin (VANCOCIN) 1,250 mg in sodium chloride 0.9 % 250 mL IVPB  Status:  Discontinued     1,250 mg 166.7 mL/hr over 90 Minutes Intravenous Every 8 hours 06/22/18 2332 06/25/18 1758   06/20/18 1000  vancomycin (VANCOCIN) 2,000 mg in  sodium chloride 0.9 % 500 mL IVPB  Status:  Discontinued     2,000 mg 250 mL/hr over 120 Minutes Intravenous Every 12 hours 06/19/18 2221 06/22/18 2332   06/18/18 2000  vancomycin (VANCOCIN) IVPB 1000 mg/200 mL premix  Status:  Discontinued     1,000 mg 200 mL/hr over 60 Minutes Intravenous Every 8 hours 06/18/18 1054 06/19/18 2221   06/18/18 1200  vancomycin (VANCOCIN) 1,750 mg in sodium chloride 0.9 % 500 mL IVPB     1,750 mg 250 mL/hr over 120 Minutes Intravenous  Once 06/18/18 1054 06/18/18 2241   06/17/18 2200  cefTRIAXone (ROCEPHIN) 2 g in sodium chloride 0.9 % 100 mL IVPB     2 g 200 mL/hr over 30 Minutes Intravenous Every 12 hours 06/17/18 1046 08/04/18 0959   06/17/18 1200  ampicillin (OMNIPEN) 2 g in sodium chloride 0.9 % 100 mL IVPB     2 g 300 mL/hr over 20 Minutes Intravenous Every 4 hours 06/17/18 1045 08/03/18 2359   06/16/18 1300  oseltamivir (TAMIFLU) capsule 75 mg     75 mg Oral 2 times daily 06/16/18 1231 06/20/18 0919   06/15/18 0700  vancomycin (VANCOCIN) IVPB 1000 mg/200 mL premix  Status:  Discontinued     1,000 mg 200 mL/hr over 60 Minutes Intravenous Every 8 hours 06/14/18 2216 06/15/18 0711  06/15/18 0500  cefTRIAXone (ROCEPHIN) 2 g in sodium chloride 0.9 % 100 mL IVPB  Status:  Discontinued     2 g 200 mL/hr over 30 Minutes Intravenous Every 24 hours 06/15/18 0445 06/17/18 1046   06/14/18 2200  azithromycin (ZITHROMAX) 500 mg in sodium chloride 0.9 % 250 mL IVPB  Status:  Discontinued     500 mg 250 mL/hr over 60 Minutes Intravenous Every 24 hours 06/14/18 2134 06/15/18 0711   06/14/18 2200  cefTRIAXone (ROCEPHIN) 1 g in sodium chloride 0.9 % 100 mL IVPB  Status:  Discontinued     1 g 200 mL/hr over 30 Minutes Intravenous Every 24 hours 06/14/18 2134 06/15/18 0445   06/14/18 2200  vancomycin (VANCOCIN) 1,500 mg in sodium chloride 0.9 % 500 mL IVPB     1,500 mg 250 mL/hr over 120 Minutes Intravenous  Once 06/14/18 2134 06/15/18 0235     Subjective: Seen  and examined bedside and she states that she is doing okay.  Denies any chest pain, lightheadedness or dizziness.  Continues to be intermittently nauseous and states that this is "all the time, but I am used to it."  No other concerns or complaints at this time and is coloring to pass the time.  Objective: Vitals:   07/26/18 1704 07/26/18 2035 07/26/18 2313 07/27/18 0822  BP: 114/70 117/78 114/72 (!) 125/93  Pulse: 99 96 93 91  Resp: 16 16 17 18   Temp:  98.2 F (36.8 C) (!) 97.5 F (36.4 C) (!) 97.4 F (36.3 C)  TempSrc:  Oral Oral Oral  SpO2: 97% 97% 97% 99%  Weight:      Height:        Intake/Output Summary (Last 24 hours) at 07/27/2018 1100 Last data filed at 07/27/2018 0600 Gross per 24 hour  Intake 2024.58 ml  Output -  Net 2024.58 ml   Filed Weights   07/22/18 0627 07/23/18 0505 07/24/18 0500  Weight: 82.6 kg 82.6 kg 82.6 kg   Examination: Physical Exam:  Constitutional: Well-nourished, well-developed Caucasian female currently no acute distress Respiratory: Clear to auscultation bilaterally no appreciable wheezing, rales, rhonchi Cardiovascular: Regular rate and rhythm.  No appreciable murmurs, rubs, gallops Abdomen: Soft, nontender, distended secondary to being pregnant.  Right lower quadrant area of induration with hematoma is is improving slightly GU: Deferred Psychiatric: Mood and affect.  Intact judgment and insight.  Data Reviewed: I have personally reviewed following labs and imaging studies  CBC: Recent Labs  Lab 07/23/18 0839 07/24/18 0639 07/24/18 1331 07/25/18 0400 07/26/18 0500  WBC 7.1 5.7 5.9 6.9 7.2  NEUTROABS 5.4 3.9 4.3 4.7 4.9  HGB 10.1* 7.6* 8.8* 9.3* 9.6*  HCT 33.5* 25.6* 29.5* 30.5* 31.9*  MCV 89.6 91.1 91.3 89.7 89.6  PLT 368 189 373 398 341*   Basic Metabolic Panel: Recent Labs  Lab 07/22/18 0800 07/23/18 0839 07/24/18 1331 07/25/18 0400 07/26/18 0500  NA 133* 135 137 135  --   K 3.8 3.7 3.0* 3.9  --   CL 99 101 109 101  --    CO2 25 25 20* 25  --   GLUCOSE 96 105* 92 86  --   BUN 7 8 8 10   --   CREATININE 0.39* 0.48 0.38* 0.43*  --   CALCIUM 8.9 9.3 7.4* 9.0  --   MG  --  1.6* 1.3* 1.5* 1.7  PHOS  --  3.8 3.4 4.3  --    GFR: Estimated Creatinine Clearance: 110.5 mL/min (A) (by  C-G formula based on SCr of 0.43 mg/dL (L)). Liver Function Tests: Recent Labs  Lab 07/23/18 0839 07/24/18 1331 07/25/18 0400  AST 28 26 24   ALT 19 16 16   ALKPHOS 192* 158* 160*  BILITOT 0.9 0.6 0.7  PROT 6.8 5.9* 6.2*  ALBUMIN 2.4* 2.0* 2.2*   No results for input(s): LIPASE, AMYLASE in the last 168 hours. No results for input(s): AMMONIA in the last 168 hours. Coagulation Profile: No results for input(s): INR, PROTIME in the last 168 hours. Cardiac Enzymes: No results for input(s): CKTOTAL, CKMB, CKMBINDEX, TROPONINI in the last 168 hours. BNP (last 3 results) No results for input(s): PROBNP in the last 8760 hours. HbA1C: Recent Labs    07/25/18 0400  HGBA1C 4.9   CBG: No results for input(s): GLUCAP in the last 168 hours. Lipid Profile: No results for input(s): CHOL, HDL, LDLCALC, TRIG, CHOLHDL, LDLDIRECT in the last 72 hours. Thyroid Function Tests: No results for input(s): TSH, T4TOTAL, FREET4, T3FREE, THYROIDAB in the last 72 hours. Anemia Panel: No results for input(s): VITAMINB12, FOLATE, FERRITIN, TIBC, IRON, RETICCTPCT in the last 72 hours. Sepsis Labs: No results for input(s): PROCALCITON, LATICACIDVEN in the last 168 hours.  No results found for this or any previous visit (from the past 240 hour(s)).   Radiology Studies: Dg Chest Port 1 View  Result Date: 07/26/2018 CLINICAL DATA:  PICC line placement. Patient is pregnant. EXAM: PORTABLE CHEST 1 VIEW COMPARISON:  06/14/2018 FINDINGS: A right PICC line has been placed with tip over the upper SVC region. No pneumothorax. Linear infiltrates in the lung bases with peribronchial thickening suggesting pneumonia and bronchitis. Cardiac enlargement without  vascular congestion. Postoperative changes in the thoracic spine. IMPRESSION: Right PICC line placed with tip over the upper SVC region. No pneumothorax. Linear infiltrates and peribronchial thickening in the lung bases suggesting pneumonia and bronchitis. Electronically Signed   By: Lucienne Capers M.D.   On: 07/26/2018 02:26    Scheduled Meds: . chlorhexidine  15 mL Mouth/Throat BID  . enoxaparin (LOVENOX) injection  40 mg Subcutaneous Q24H  . feeding supplement (ENSURE ENLIVE)  237 mL Oral BID BM  . Gerhardt's butt cream   Topical BID  . methadone  40 mg Oral q1800  . methadone  60 mg Oral Daily  . nystatin   Topical BID  . pantoprazole  40 mg Oral BID  . prenatal multivitamin  1 tablet Oral Q1200  . sodium chloride flush  10-40 mL Intracatheter Q12H   Continuous Infusions: . sodium chloride 10 mL/hr at 07/19/18 0400  . ampicillin (OMNIPEN) IV 2 g (07/27/18 0805)  . cefTRIAXone (ROCEPHIN)  IV 2 g (07/26/18 2149)  . ferumoxytol 510 mg (07/22/18 2042)  . lactated ringers 10 mL/hr at 07/03/18 2101  . vancomycin 1,000 mg (07/27/18 1001)    LOS: 62 days   Kerney Elbe, DO [facility] PAGER is on [place]  If 7PM-7AM, please contact night-coverage [email or web address] Password [email or web address] 07/27/2018, 11:00 AM

## 2018-07-28 DIAGNOSIS — Z3A26 26 weeks gestation of pregnancy: Secondary | ICD-10-CM

## 2018-07-28 MED ORDER — VANCOMYCIN HCL 1000 MG IV SOLR
1000.0000 mg | Freq: Three times a day (TID) | INTRAVENOUS | Status: AC
  Administered 2018-07-28 – 2018-08-03 (×20): 1000 mg via INTRAVENOUS
  Filled 2018-07-28 (×20): qty 1000

## 2018-07-28 NOTE — Progress Notes (Signed)
FACULTY PRACTICE ANTEPARTUM PROGRESS NOTE  Kelly [name] is a 33 y.o. [id number] at 1w2dwho is admitted for bacterial endocarditis.  Estimated Date of Delivery: 11/01/18 Fetal presentation is unsure.  Length of Stay:  43 Days. Admitted 06/14/2018  Subjective:  Patient reports normal fetal movement.  She denies uterine contractions, denies bleeding and leaking of fluid per vagina.  Vitals:  Blood pressure 102/67, pulse 95, temperature 97.6 F (36.4 C), temperature source Oral, resp. rate 18, height 5' 7"  (1.702 m), weight 82.6 kg, last menstrual period 12/30/2017, SpO2 98 %, unknown if currently breastfeeding. Physical Examination: CONSTITUTIONAL: Well-developed, well-nourished female in no acute distress.  HENT:  Normocephalic, atraumatic, External right and left ear normal. Oropharynx is clear and moist EYES: Conjunctivae and EOM are normal. Pupils are equal, round, and reactive to light. No scleral icterus.  NECK: Normal range of motion, supple, no masses. SKIN: Skin is warm and dry. No rash noted. Not diaphoretic. No erythema. No pallor. [place] Alert and oriented to person, place, and time. Normal reflexes, muscle tone coordination. No cranial nerve deficit noted. PSYCHIATRIC: Normal mood and affect. Normal behavior. Normal judgment and thought content. CARDIOVASCULAR: Normal heart rate noted, regular rhythm RESPIRATORY: Effort and breath sounds normal, no problems with respiration noted MUSCULOSKELETAL: Normal range of motion. No edema and no tenderness. ABDOMEN: Soft, nontender, nondistended, gravid. CERVIX: deferred  Fetal monitoring: tracing reassuring for gestational age  No results found for this or any previous visit (from the past 454hour(s)).  No results found.  Current scheduled medications . chlorhexidine  15 mL Mouth/Throat BID  . enoxaparin (LOVENOX) injection  40 mg Subcutaneous Q24H  . feeding supplement (ENSURE ENLIVE)  237 mL Oral BID BM  . Gerhardt's  butt cream   Topical BID  . methadone  40 mg Oral q1800  . methadone  60 mg Oral Daily  . nystatin   Topical BID  . pantoprazole  40 mg Oral BID  . prenatal multivitamin  1 tablet Oral Q1200  . sodium chloride flush  10-40 mL Intracatheter Q12H    I have reviewed the patient's current medications.  ASSESSMENT: Principal Problem:   Bacteremia due to Enterococcus Active Problems:   Opioid use disorder, severe, dependence (HCC)   [place]   IVDU (intravenous drug user)   Multifocal pneumonia   Hyponatremia   Elevated transaminase level   Degenerative arthritis of lumbar spine   History of bacterial endocarditis   Severe tricuspid regurgitation by prior echocardiogram   Influenza B   Rubella non-immune status, antepartum   High-risk pregnancy in second trimester   Preexisting hypertension complicating pregnancy, antepartum   Dental caries   Phobia of dental procedure   Endocarditis   Infection due to Streptococcus mitis group   [redacted] weeks gestation of pregnancy   Abdominal distention   Acute bilateral low back pain   Hematoma   PICC (peripherally inserted central catheter) flush   PLAN: Cont daily NST Will plan for outpatient follow up once patient is discharged She is currently trying to find placement where she will cont methadone treatment   Continue routine antenatal care.   KFeliz Beam M.D. [facility] [employer](Faculty Practice)  07/28/2018 4:02 PM

## 2018-07-28 NOTE — Progress Notes (Signed)
RROB performed daily NST.  FHR is reassuring and 15X15 acceleration noted. No ucs traced or palpated.  Pt has no complaints of lof or vaginal bleeding.  Dr Rosana Hoes notified.

## 2018-07-28 NOTE — Progress Notes (Signed)
PROGRESS NOTE    Kelly [name]  NID:[id number] DOB: 15-Aug-1985 DOA: 06/14/2018 PCP: [facility]  Brief Narrative:  Kelly [name] is a 33 y.o. 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  Hospital course: On initial presentation patient was afebrile, tachypneic, tachycardic, with leukocytosis with hypotension consistent with sepsis. Her hCG was greater than 2000 and ultrasound showed intrauterine gestation with estimated gestational age of [redacted] weeks.  MRI of back was negative for acute infection.  She was initially started on IV vancomycin and Rocephin and azithromycin in the ED before transition to IV Rocephin due to multifocal pneumonia and Tamiflu for influenza B.  IV ampicillin and IV vancomycin were later added was added to this regimen due to E faecalis and Streptococcus mitis/paralysis bacteremia.  CT surgery was consulted due to worsening vegetation on TTE and severe TR patient is not a surgical candidate patient has been on IV ceftriaxone, IV ampicillin, IV vancomycin for MRSA bacterial endocarditis/bacteremia  **Interm History  Patient states she is still not sleeping well while being hospitalized. Complained to the nurse about a "knot" on her abdomen that is likely a Hematoma from a Lovenox Injection. She feels that it is improving and getting smaller with warm compresses. Continues to be intermittently nauseous and on the night og 3/13-3/14PICC line was pulled out a little and fixed by IV Team.  She is trying to  coordinate discharge plans afterwards and found a shelter which will take pregnant woman with addictions and needed to discuss this with the social worker today.  Assessment & Plan:   Principal Problem:   Bacteremia due to Enterococcus Active Problems:   Opioid use disorder, severe, dependence (HCC)   Sepsis due to pneumonia ([place])   IVDU (intravenous drug user)   Multifocal pneumonia   Hyponatremia   Elevated transaminase level   Degenerative arthritis of lumbar spine   History of bacterial endocarditis   Severe tricuspid regurgitation by prior echocardiogram   Influenza B   Rubella non-immune status, antepartum   High-risk pregnancy in second trimester   Preexisting hypertension complicating pregnancy, antepartum   Dental caries   Phobia of dental procedure   Endocarditis   Infection due to Streptococcus mitis group   [redacted] weeks gestation of pregnancy   Abdominal distention   Acute bilateral low back pain   Hematoma   PICC (peripherally inserted central catheter) flush  Streptococcus oralis/sanguinis/staph aureus bacteremia secondary to tricuspid valve endocarditis.   -Positive blood cultures on 06/14/2018.   -TEE large tricuspid valve vegetation, preserved EF, elevated RVSP, enlarged RV.   -Not a surgical candidate per CT surgery evaluation.  -Blood cultures on 2/3 have remained negative over 5 days -Continue 6 weeks of IV ceftriaxone, IV ampicillin, IV vancomycin, end date 3/22 per ID recommendations to complete in hospital given recent IV drug use -Follow-up with ID arranged  Current Pregnancy -Monitored by obstetric RN with daily fetal NST  Estimated date of delivery 11/01/2018, Plan to initiate routine monitoring once which is viability in 24 weeks, OB recommends avoiding treating BP in pregnancy unless consistently greater than 150/100. Fetal ultrasound wnl on  3/9.  -Continue Prenatal vitamins -C/w Daily fetal NST by obstetric RN; Per Nursing Note on 07/26/2018:  "FHR 145  BPM, min variability, 15x15 accel, no decels, no uc's. Pt says she feels the baby move. She denies vaginal bleeding or leaking of fluid, or feeling uc's"  Dental caries s/p dentral extration (07/03/18) -Dental imaging reviewed dental abscess/dental caries prompting a dental extraction with alveoloplasty on 07/03/2018 by Dentistry.  Healing well per their evaluation postoperatively 3/3. -In future will need antibiotic medication prior to invasive dental procedures given previous endocarditis.  IV Heroin/Amphetamine Abuse. -Continue methadone 60 mg po daily and 40 mg at 1800 Daily (had not been adherent or following pain clinic since 04/2018),  -Will need to re-establish with pain clinic at D/C -Social Worker making referral to [facility] patient was asking about a program in [place]  Groin rash, very likely yeast infection, improving -Continue Nystatin powder, closely monitor -Added Gerhardt's Butt Cream  Sepsis secondary to bacteremia of Streptococcus oralis/mitis and E faecalis secondary to tricuspid valve endocarditis along with Influenza B infection, multifocal pneumonia, resolved -On presentation was hypotensive, tachypneic, tachycardic, white count of 15.9.; WBC on 07/26/2018 was 7.2 -Bilateral airspace opacities on admission chest x-ray.   -No longer having cough.   -Completed antibiotic course with IV ceftriaxone for multifocal pneumonia but remains on IV Ceftriaxone for Bacteremia.  Doing well on room air.   -Still completing IV antibiotic course for endocarditis.  Physiologic anemia of pregnancy required 2 units transfusion for hemoglobin of 6.8 (2/7).   -Has remained stable between 7 and 8 but now worsened ? If spurious Drop from yesterday to today  -Iron panel labs consistent with iron deficiency anemia as iron level was 82, U IBC was 552, TIBC was 634, saturation ratios of 13%, ferritin level is 29 -OB recommends Fere heme infusion x2 (3/10)  -Hb/Hct went from 10.1/33.5 -> 7.6/25.6 and ? Spurious drop so was repeated and was 8.8/29.5 and the day before yesterday was 9.6/31.9 -Has an RLQ Abdominal Hematoma -Continue to Monitor for S/Sx of Bleeding -Repeat CBC intermittently and will repeat in AM  Back pain, resolved -Given bacteremia and known IV drug use MRI was obtained on admission which was negative for active infection of lumbar spine. -C/w Acetaminophen 650 mg po q6hprn Mild Pain and C/w Ibuprofen 500 mg po q6hprn Moderate Pain; C/w Home Methadone of 60 mg po daily and 40 mg po qHS and  -Continue to Monitor Closely   Influenza B, resolved. -Completed course of Oseltamivir and no longer on droplet precautions -C/w Benzonatate 200 mg po TIDprn Cough  Dysuria in the setting of E Coli UTI, resolved -Found to have E. coli UTI on 2/1, completed course with IV ceftriaxone.   -Most recent urine culture on 2/13 showed No Growth  Hypomagnesemia -Patient's Mag Level the day before yesterday was 1.7 -Continue to Monitor and Replete as Necessary -Repeat Mag Level this AM pending   Hyperglycemia -Patient's blood sugar has been fluctuating and likley reactive -HbA1c was 4.9 -Continue to Monitor Blood Sugars carefully and if necessary may place on Sliding Scale Insulin   Hyponatremia -Improved and last Na+ was 135 -Continue to Monitor intermittently   Drop in Platelet Count -? Spurious Result -Patient's Platelet Count went from 368 -> 189 but on repeat yesterday was 373 and the day before yesterday was 398 -Continue to Monitor and repeat CBC intermittently   Abdominal Hematoma -Likely related to Lovenox Injections -Limited sonographic evaluation in the right lower  quadrant of the abdomen demonstrates predominantly cystic but complex abnormality measuring 2.9 x 1.7 x 1.6 cm. This may represent hematoma or seroma, but abscess can not be excluded.  -Less Likely Abscess as patient is Afebrile and has now WBC -Noticed this  happening since last week and in the setting of Lovenox Injections -Apply Warm Compress Daily and she feels that this has been helping and thinks this is improving  Nausea, stable -Likely Related to Pregnancy and is intermittent and she states that it is "all the time." -C/w Ondansetron 4 mg IV q6hprn Refractory Nausea and Vomiting and C/w Promethazine 12.5-25 mg IV q6hprn  -Need to be Judicious during pregnancy as Phenergan is Category C  DVT prophylaxis: Enoxaparin 40 mg sq q24h Code Status: FULL CODE Family Communication: No family present at bedside Disposition Plan: Pending completion of IV Abx  Consultants:   Dentistry  Infectious Diseases  OB-GYN   Procedures:  2/3 TTE  1. Tricuspid regurgitation severe by color flow Doppler. There is a large mobile tricuspid valve vegetation on the anterior leaflet. The TV vegetation measures 10 mm x 20 mm. 2. The left ventricle has normal systolic function of 82-99%. The cavity size is normal. There is no left ventricular wall thickness. Echo evidence of normal diastolic filling patterns. 3. The right ventricle is moderately enlarged in size. There is normal systolic function. Right ventricular systolic pressure is normal with an estimated pressure of 43.8 mmHg. 4. Mildly dilated left atrial size. 5. Moderately dilated right atrial size. 6. The mitral valve is myxomatous. There is mild thickening. Regurgitation is not visualized by color flow Doppler. 7. Large vegetation on the tricuspid valve. 8. Myxomatous tricuspid valve. 9. Tricuspid regurgitation severe. 10. No atrial level shunt detected by color flow Doppler.  07/03/18 1. Multiple extraction of tooth numbers30 and 31 2.Onequadrants of alveoloplasty   Antimicrobials:  Anti-infectives (From admission, onward)   Start     Dose/Rate Route Frequency Ordered Stop   07/28/18 1100  vancomycin (VANCOCIN) 1,000 mg in sodium chloride 0.9 % 250 mL IVPB     1,000 mg 250  mL/hr over 60 Minutes Intravenous Every 8 hours 07/28/18 1020 08/04/18 0259   07/28/18 0000  vancomycin (VANCOCIN) 1,000 mg in sodium chloride 0.9 % 250 mL IVPB  Status:  Discontinued     1,000 mg 250 mL/hr over 60 Minutes Intravenous Every 8 hours 07/27/18 2357 07/28/18 1020   06/26/18 0100  vancomycin (VANCOCIN) IVPB 1000 mg/200 mL premix  Status:  Discontinued     1,000 mg 200 mL/hr over 60 Minutes Intravenous Every 8 hours 06/25/18 1758 07/27/18 2357   06/23/18 0000  vancomycin (VANCOCIN) 1,250 mg in sodium chloride 0.9 % 250 mL IVPB  Status:  Discontinued     1,250 mg 166.7 mL/hr over 90 Minutes Intravenous Every 8 hours 06/22/18 2332 06/25/18 1758   06/20/18 1000  vancomycin (VANCOCIN) 2,000 mg in sodium chloride 0.9 % 500 mL IVPB  Status:  Discontinued     2,000 mg 250 mL/hr over 120 Minutes Intravenous Every 12 hours 06/19/18 2221 06/22/18 2332   06/18/18 2000  vancomycin (VANCOCIN) IVPB 1000 mg/200 mL premix  Status:  Discontinued     1,000 mg 200 mL/hr over 60 Minutes Intravenous Every 8 hours 06/18/18 1054 06/19/18 2221   06/18/18 1200  vancomycin (VANCOCIN) 1,750 mg in sodium chloride 0.9 % 500 mL IVPB     1,750 mg 250 mL/hr over 120 Minutes Intravenous  Once 06/18/18 1054 06/18/18 2241  06/17/18 2200  cefTRIAXone (ROCEPHIN) 2 g in sodium chloride 0.9 % 100 mL IVPB     2 g 200 mL/hr over 30 Minutes Intravenous Every 12 hours 06/17/18 1046 08/04/18 0959   06/17/18 1200  ampicillin (OMNIPEN) 2 g in sodium chloride 0.9 % 100 mL IVPB     2 g 300 mL/hr over 20 Minutes Intravenous Every 4 hours 06/17/18 1045 08/03/18 2359   06/16/18 1300  oseltamivir (TAMIFLU) capsule 75 mg     75 mg Oral 2 times daily 06/16/18 1231 06/20/18 0919   06/15/18 0700  vancomycin (VANCOCIN) IVPB 1000 mg/200 mL premix  Status:  Discontinued     1,000 mg 200 mL/hr over 60 Minutes Intravenous Every 8 hours 06/14/18 2216 06/15/18 0711   06/15/18 0500  cefTRIAXone (ROCEPHIN) 2 g in sodium chloride 0.9 % 100  mL IVPB  Status:  Discontinued     2 g 200 mL/hr over 30 Minutes Intravenous Every 24 hours 06/15/18 0445 06/17/18 1046   06/14/18 2200  azithromycin (ZITHROMAX) 500 mg in sodium chloride 0.9 % 250 mL IVPB  Status:  Discontinued     500 mg 250 mL/hr over 60 Minutes Intravenous Every 24 hours 06/14/18 2134 06/15/18 0711   06/14/18 2200  cefTRIAXone (ROCEPHIN) 1 g in sodium chloride 0.9 % 100 mL IVPB  Status:  Discontinued     1 g 200 mL/hr over 30 Minutes Intravenous Every 24 hours 06/14/18 2134 06/15/18 0445   06/14/18 2200  vancomycin (VANCOCIN) 1,500 mg in sodium chloride 0.9 % 500 mL IVPB     1,500 mg 250 mL/hr over 120 Minutes Intravenous  Once 06/14/18 2134 06/15/18 0235     Subjective: Seen and examined at bedside and had questions for the social worker.  Denies any chest pain, lightheadedness or dizziness.  No nausea or vomiting.  States that she slept okay.  No other concerns or complaints at this time but is trying to coordinate discharge plans after completion of IV antibiotics.  Objective: Vitals:   07/27/18 2010 07/27/18 2317 07/28/18 0325 07/28/18 0816  BP: 116/81 105/74 103/73 102/67  Pulse: 94 92 85 95  Resp: 18 20 18 18   Temp: 98.2 F (36.8 C) 98.4 F (36.9 C) 98 F (36.7 C) 97.6 F (36.4 C)  TempSrc: Oral Oral Oral Oral  SpO2: 97% 96% 99% 98%  Weight:      Height:        Intake/Output Summary (Last 24 hours) at 07/28/2018 1247 Last data filed at 07/28/2018 0030 Gross per 24 hour  Intake 310 ml  Output -  Net 310 ml   Filed Weights   07/22/18 0627 07/23/18 0505 07/24/18 0500  Weight: 82.6 kg 82.6 kg 82.6 kg   Examination: Physical Exam:  Constitutional: Well Nourished, well-developed pregnant Caucasian female currently no acute distress Respiratory: Clear to auscultation bilaterally no appreciable wheezing, rales, rhonchi Cardiovascular: Regular rate and rhythm.  No appreciable murmurs, rubs, or gallops. Abdomen: Soft, nontender, distended secondary to  patient being pregnant.  Right lower quadrant has a hematoma which is improving GU: Deferred Psychiatric: Pleasant Mood and affect.  Intact judgment and insight.  Data Reviewed: I have personally reviewed following labs and imaging studies  CBC: Recent Labs  Lab 07/23/18 0839 07/24/18 0639 07/24/18 1331 07/25/18 0400 07/26/18 0500  WBC 7.1 5.7 5.9 6.9 7.2  NEUTROABS 5.4 3.9 4.3 4.7 4.9  HGB 10.1* 7.6* 8.8* 9.3* 9.6*  HCT 33.5* 25.6* 29.5* 30.5* 31.9*  MCV 89.6 91.1 91.3 89.7  89.6  PLT 368 189 373 398 811*   Basic Metabolic Panel: Recent Labs  Lab 07/22/18 0800 07/23/18 0839 07/24/18 1331 07/25/18 0400 07/26/18 0500  NA 133* 135 137 135  --   K 3.8 3.7 3.0* 3.9  --   CL 99 101 109 101  --   CO2 25 25 20* 25  --   GLUCOSE 96 105* 92 86  --   BUN 7 8 8 10   --   CREATININE 0.39* 0.48 0.38* 0.43*  --   CALCIUM 8.9 9.3 7.4* 9.0  --   MG  --  1.6* 1.3* 1.5* 1.7  PHOS  --  3.8 3.4 4.3  --    GFR: Estimated Creatinine Clearance: 110.5 mL/min (A) (by C-G formula based on SCr of 0.43 mg/dL (L)). Liver Function Tests: Recent Labs  Lab 07/23/18 0839 07/24/18 1331 07/25/18 0400  AST 28 26 24   ALT 19 16 16   ALKPHOS 192* 158* 160*  BILITOT 0.9 0.6 0.7  PROT 6.8 5.9* 6.2*  ALBUMIN 2.4* 2.0* 2.2*   No results for input(s): LIPASE, AMYLASE in the last 168 hours. No results for input(s): AMMONIA in the last 168 hours. Coagulation Profile: No results for input(s): INR, PROTIME in the last 168 hours. Cardiac Enzymes: No results for input(s): CKTOTAL, CKMB, CKMBINDEX, TROPONINI in the last 168 hours. BNP (last 3 results) No results for input(s): PROBNP in the last 8760 hours. HbA1C: No results for input(s): HGBA1C in the last 72 hours. CBG: No results for input(s): GLUCAP in the last 168 hours. Lipid Profile: No results for input(s): CHOL, HDL, LDLCALC, TRIG, CHOLHDL, LDLDIRECT in the last 72 hours. Thyroid Function Tests: No results for input(s): TSH, T4TOTAL, FREET4,  T3FREE, THYROIDAB in the last 72 hours. Anemia Panel: No results for input(s): VITAMINB12, FOLATE, FERRITIN, TIBC, IRON, RETICCTPCT in the last 72 hours. Sepsis Labs: No results for input(s): PROCALCITON, LATICACIDVEN in the last 168 hours.  No results found for this or any previous visit (from the past 240 hour(s)).   Radiology Studies: No results found.  Scheduled Meds: . chlorhexidine  15 mL Mouth/Throat BID  . enoxaparin (LOVENOX) injection  40 mg Subcutaneous Q24H  . feeding supplement (ENSURE ENLIVE)  237 mL Oral BID BM  . Gerhardt's butt cream   Topical BID  . methadone  40 mg Oral q1800  . methadone  60 mg Oral Daily  . nystatin   Topical BID  . pantoprazole  40 mg Oral BID  . prenatal multivitamin  1 tablet Oral Q1200  . sodium chloride flush  10-40 mL Intracatheter Q12H   Continuous Infusions: . sodium chloride 10 mL/hr at 07/19/18 0400  . ampicillin (OMNIPEN) IV 2 g (07/28/18 1136)  . cefTRIAXone (ROCEPHIN)  IV 2 g (07/28/18 1034)  . ferumoxytol 510 mg (07/22/18 2042)  . lactated ringers 10 mL/hr at 07/03/18 2101  . vancomycin 1,000 mg (07/28/18 1039)    LOS: 52 days   Kerney Elbe, DO [facility] PAGER is on [place]  If 7PM-7AM, please contact night-coverage [email or web address] Password [facility] 07/28/2018, 12:47 PM

## 2018-07-29 LAB — CBC WITH DIFFERENTIAL/PLATELET
Abs Immature Granulocytes: 0.04 10*3/uL (ref 0.00–0.07)
Basophils Absolute: 0 10*3/uL (ref 0.0–0.1)
Basophils Relative: 1 %
EOS PCT: 2 %
Eosinophils Absolute: 0.1 10*3/uL (ref 0.0–0.5)
HEMATOCRIT: 29.4 % — AB (ref 36.0–46.0)
Hemoglobin: 9.3 g/dL — ABNORMAL LOW (ref 12.0–15.0)
Immature Granulocytes: 1 %
LYMPHS ABS: 1.3 10*3/uL (ref 0.7–4.0)
Lymphocytes Relative: 18 %
MCH: 28.4 pg (ref 26.0–34.0)
MCHC: 31.6 g/dL (ref 30.0–36.0)
MCV: 89.6 fL (ref 80.0–100.0)
Monocytes Absolute: 0.5 10*3/uL (ref 0.1–1.0)
Monocytes Relative: 6 %
Neutro Abs: 5.3 10*3/uL (ref 1.7–7.7)
Neutrophils Relative %: 72 %
Platelets: 385 10*3/uL (ref 150–400)
RBC: 3.28 MIL/uL — ABNORMAL LOW (ref 3.87–5.11)
RDW: 17.2 % — ABNORMAL HIGH (ref 11.5–15.5)
WBC: 7.3 10*3/uL (ref 4.0–10.5)
nRBC: 0 % (ref 0.0–0.2)

## 2018-07-29 LAB — MAGNESIUM: Magnesium: 1.5 mg/dL — ABNORMAL LOW (ref 1.7–2.4)

## 2018-07-29 LAB — COMPREHENSIVE METABOLIC PANEL
ALT: 22 U/L (ref 0–44)
AST: 30 U/L (ref 15–41)
Albumin: 2.3 g/dL — ABNORMAL LOW (ref 3.5–5.0)
Alkaline Phosphatase: 157 U/L — ABNORMAL HIGH (ref 38–126)
Anion gap: 9 (ref 5–15)
BUN: 11 mg/dL (ref 6–20)
CO2: 24 mmol/L (ref 22–32)
Calcium: 9.2 mg/dL (ref 8.9–10.3)
Chloride: 104 mmol/L (ref 98–111)
Creatinine, Ser: 0.41 mg/dL — ABNORMAL LOW (ref 0.44–1.00)
Glucose, Bld: 137 mg/dL — ABNORMAL HIGH (ref 70–99)
Potassium: 3.6 mmol/L (ref 3.5–5.1)
Sodium: 137 mmol/L (ref 135–145)
Total Bilirubin: 0.6 mg/dL (ref 0.3–1.2)
Total Protein: 6.4 g/dL — ABNORMAL LOW (ref 6.5–8.1)

## 2018-07-29 LAB — PHOSPHORUS: PHOSPHORUS: 4.3 mg/dL (ref 2.5–4.6)

## 2018-07-29 MED ORDER — MAGNESIUM SULFATE 2 GM/50ML IV SOLN
2.0000 g | Freq: Once | INTRAVENOUS | Status: AC
  Administered 2018-07-29: 2 g via INTRAVENOUS
  Filled 2018-07-29: qty 50

## 2018-07-29 NOTE — Progress Notes (Signed)
Daily FHR NST completed.  Pt denies vaginal bleeding, LOF, or UC's and reports good fetal movement.

## 2018-07-29 NOTE — Progress Notes (Signed)
PROGRESS NOTE    Kelly [name]  XVQ:[id number] DOB: 08/20/1985 DOA: 06/14/2018 PCP: [facility]  Brief Narrative:  Kelly [name] is a 33 y.o. 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  Hospital course: On initial presentation patient was afebrile, tachypneic, tachycardic, with leukocytosis with hypotension consistent with sepsis. Her hCG was greater than 2000 and ultrasound showed intrauterine gestation with estimated gestational age of [redacted] weeks.  MRI of back was negative for acute infection.  She was initially started on IV vancomycin and Rocephin and azithromycin in the ED before transition to IV Rocephin due to multifocal pneumonia and Tamiflu for influenza B.  IV ampicillin and IV vancomycin were later added was added to this regimen due to E faecalis and Streptococcus mitis/paralysis bacteremia.  CT surgery was consulted due to worsening vegetation on TTE and severe TR patient is not a surgical candidate patient has been on IV ceftriaxone, IV ampicillin, IV vancomycin for MRSA bacterial endocarditis/bacteremia  **Interm History  Patient states she is still not sleeping well while being hospitalized. Complained to the nurse about a "knot" on her abdomen that is likely a Hematoma from a Lovenox Injection. She feels that it is improving and getting smaller with warm compresses. Continues to be intermittently nauseous and on the night og 3/13-3/14PICC line was pulled out a little and fixed by IV Team.  She is trying to  coordinate discharge plans afterwards and found a shelter which will take pregnant woman with addictions and needed to discuss this with the social worker as she is to go on Methadone.  Assessment & Plan:   Principal Problem:   Bacteremia due to Enterococcus Active Problems:   Opioid use disorder, severe, dependence (HCC)   Sepsis due to pneumonia ([facility])   IVDU (intravenous drug user)   Multifocal pneumonia   Hyponatremia   Elevated transaminase level   Degenerative arthritis of lumbar spine   History of bacterial endocarditis   Severe tricuspid regurgitation by prior echocardiogram   Influenza B   Rubella non-immune status, antepartum   High-risk pregnancy in second trimester   Preexisting hypertension complicating pregnancy, antepartum   Dental caries   Phobia of dental procedure   Endocarditis   Infection due to Streptococcus mitis group   [redacted] weeks gestation of pregnancy   Abdominal distention   Acute bilateral low back pain   Hematoma   PICC (peripherally inserted central catheter) flush  Streptococcus oralis/sanguinis/staph aureus bacteremia secondary to tricuspid valve endocarditis.   -Positive blood cultures on 06/14/2018.   -TEE large tricuspid valve vegetation, preserved EF, elevated RVSP, enlarged RV.   -Not a surgical candidate per CT surgery evaluation.  -Blood cultures on 2/3 have remained negative over 5 days -Continue 6 weeks of IV ceftriaxone, IV ampicillin, IV vancomycin, end date 3/22 per ID recommendations to complete in hospital given recent IV drug use -Follow-up with ID arranged  Current Pregnancy -Monitored by obstetric RN with daily fetal NST  Estimated date of delivery 11/01/2018, Plan to initiate routine monitoring once which is viability in 24 weeks, OB recommends avoiding treating BP in pregnancy unless consistently greater  than 150/100. Fetal ultrasound wnl on 3/9.  -Continue Prenatal vitamins -C/w Daily fetal NST by obstetric RN; Per Nursing Note on  07/28/2018: "FHR is reassuring and 15X15 acceleration noted. No ucs traced or palpated.  Pt has no complaints of lof or vaginal bleeding"  Dental caries s/p dentral extration (07/03/18) -Dental imaging reviewed dental abscess/dental caries prompting a dental extraction with alveoloplasty on 07/03/2018 by Dentistry.  Healing well per their evaluation postoperatively 3/3. -In future will need antibiotic medication prior to invasive dental procedures given previous endocarditis.  IV Heroin/Amphetamine Abuse. -Continue methadone 60 mg po daily and 40 mg at 1800 Daily (had not been adherent or following pain clinic since 04/2018),  -Will need to re-establish with pain clinic at D/C -Social Worker making referral to [facility] patient was asking about a program in [place]  Groin rash, very likely yeast infection, improving -Continue Nystatin powder, closely monitor -Added Gerhardt's Butt Cream  Sepsis secondary to bacteremia of Streptococcus oralis/mitis and E faecalis secondary to tricuspid valve endocarditis along with Influenza B infection, multifocal pneumonia, resolved -On presentation was hypotensive, tachypneic, tachycardic, white count of 15.9.; WBC this AM was 7.3 -Bilateral airspace opacities on admission chest x-ray.   -No longer having cough.   -Completed antibiotic course with IV ceftriaxone for multifocal pneumonia but remains on IV Ceftriaxone for Bacteremia.  Doing well on room air.   -Still completing IV antibiotic course for endocarditis.  Physiologic anemia of pregnancy required 2 units transfusion for hemoglobin of 6.8 (2/7).   -Has remained stable between 7 and 8 but now worsened ? If spurious Drop from yesterday to today  -Iron panel labs consistent with iron deficiency anemia as iron level was 82, U IBC was 552, TIBC was 634, saturation ratios of 13%, ferritin level is 29 -OB recommends Fere heme infusion x2 (3/10) -Hb/Hct went from  10.1/33.5 -> 7.6/25.6 and ? Spurious drop so was repeated and was 8.8/29.5 and now is 9.3/29.4 -Has an RLQ Abdominal Hematoma -Continue to Monitor for S/Sx of Bleeding -Repeat CBC intermittently   Back pain, resolved -Given bacteremia and known IV drug use MRI was obtained on admission which was negative for active infection of lumbar spine. -C/w Acetaminophen 650 mg po q6hprn Mild Pain and C/w Ibuprofen 500 mg po q6hprn Moderate Pain; C/w Home Methadone of 60 mg po daily and 40 mg po qHS and  -Continue to Monitor Closely   Influenza B, resolved. -Completed course of Oseltamivir and no longer on droplet precautions -C/w Benzonatate 200 mg po TIDprn Cough  Dysuria in the setting of E Coli UTI, resolved -Found to have E. coli UTI on 2/1, completed course with IV ceftriaxone.   -Most recent urine culture on 2/13 showed No Growth  Hypomagnesemia -Patient's Mag Level this AM was 1.5 -Replete with IV Mag Sulfate 2 grams -Continue to Monitor and Replete as Necessary -Repeat Mag Level this AM pending   Hyperglycemia -Patient's blood sugar has been fluctuating and likley reactive -HbA1c was 4.9 -Blood Sugar on BMP -Continue to Monitor Blood Sugars carefully and if necessary may place on Sliding Scale Insulin   Hyponatremia -Improved and Na+ is 137 -Continue to Monitor intermittently   Drop in Platelet Count -? Spurious Result -Patient's Platelet Count went from 368 -> 189 but on repeat now is 385 -Continue to Monitor and repeat CBC intermittently   Abdominal Hematoma -Likely related to Lovenox Injections -Limited sonographic evaluation in the right lower quadrant of the abdomen demonstrates predominantly cystic but  complex abnormality measuring 2.9 x 1.7 x 1.6 cm. This may represent hematoma or seroma, but abscess can not be excluded.  -Less Likely Abscess as patient is Afebrile and has now WBC -Noticed this happening since last week and in the setting of Lovenox  Injections -Apply Warm Compress Daily and she feels that this has been helping and thinks this is improving  Nausea, stable -Likely Related to Pregnancy and is intermittent and she states that it is "all the time." -C/w Ondansetron 4 mg IV q6hprn Refractory Nausea and Vomiting and C/w Promethazine 12.5-25 mg IV q6hprn  -Need to be Judicious during pregnancy as Phenergan is Category C  DVT prophylaxis: Enoxaparin 40 mg sq q24h Code Status: FULL CODE Family Communication: No family present at bedside Disposition Plan: Pending completion of IV Abx  Consultants:   Dentistry  Infectious Diseases  OB-GYN   Procedures:  2/3 TTE  1. Tricuspid regurgitation severe by color flow Doppler. There is a large mobile tricuspid valve vegetation on the anterior leaflet. The TV vegetation measures 10 mm x 20 mm. 2. The left ventricle has normal systolic function of 54-27%. The cavity size is normal. There is no left ventricular wall thickness. Echo evidence of normal diastolic filling patterns. 3. The right ventricle is moderately enlarged in size. There is normal systolic function. Right ventricular systolic pressure is normal with an estimated pressure of 43.8 mmHg. 4. Mildly dilated left atrial size. 5. Moderately dilated right atrial size. 6. The mitral valve is myxomatous. There is mild thickening. Regurgitation is not visualized by color flow Doppler. 7. Large vegetation on the tricuspid valve. 8. Myxomatous tricuspid valve. 9. Tricuspid regurgitation severe. 10. No atrial level shunt detected by color flow Doppler.  07/03/18 1. Multiple extraction of tooth numbers30 and 31 2.Onequadrants of alveoloplasty   Antimicrobials:  Anti-infectives (From admission, onward)   Start     Dose/Rate Route Frequency Ordered Stop   07/28/18 1100  vancomycin (VANCOCIN) 1,000 mg in sodium chloride 0.9 % 250 mL IVPB     1,000 mg 250 mL/hr over 60 Minutes Intravenous Every 8 hours 07/28/18  1020 08/04/18 0259   07/28/18 0000  vancomycin (VANCOCIN) 1,000 mg in sodium chloride 0.9 % 250 mL IVPB  Status:  Discontinued     1,000 mg 250 mL/hr over 60 Minutes Intravenous Every 8 hours 07/27/18 2357 07/28/18 1020   06/26/18 0100  vancomycin (VANCOCIN) IVPB 1000 mg/200 mL premix  Status:  Discontinued     1,000 mg 200 mL/hr over 60 Minutes Intravenous Every 8 hours 06/25/18 1758 07/27/18 2357   06/23/18 0000  vancomycin (VANCOCIN) 1,250 mg in sodium chloride 0.9 % 250 mL IVPB  Status:  Discontinued     1,250 mg 166.7 mL/hr over 90 Minutes Intravenous Every 8 hours 06/22/18 2332 06/25/18 1758   06/20/18 1000  vancomycin (VANCOCIN) 2,000 mg in sodium chloride 0.9 % 500 mL IVPB  Status:  Discontinued     2,000 mg 250 mL/hr over 120 Minutes Intravenous Every 12 hours 06/19/18 2221 06/22/18 2332   06/18/18 2000  vancomycin (VANCOCIN) IVPB 1000 mg/200 mL premix  Status:  Discontinued     1,000 mg 200 mL/hr over 60 Minutes Intravenous Every 8 hours 06/18/18 1054 06/19/18 2221   06/18/18 1200  vancomycin (VANCOCIN) 1,750 mg in sodium chloride 0.9 % 500 mL IVPB     1,750 mg 250 mL/hr over 120 Minutes Intravenous  Once 06/18/18 1054 06/18/18 2241   06/17/18 2200  cefTRIAXone (ROCEPHIN) 2 g in  sodium chloride 0.9 % 100 mL IVPB     2 g 200 mL/hr over 30 Minutes Intravenous Every 12 hours 06/17/18 1046 08/04/18 0959   06/17/18 1200  ampicillin (OMNIPEN) 2 g in sodium chloride 0.9 % 100 mL IVPB     2 g 300 mL/hr over 20 Minutes Intravenous Every 4 hours 06/17/18 1045 08/03/18 2359   06/16/18 1300  oseltamivir (TAMIFLU) capsule 75 mg     75 mg Oral 2 times daily 06/16/18 1231 06/20/18 0919   06/15/18 0700  vancomycin (VANCOCIN) IVPB 1000 mg/200 mL premix  Status:  Discontinued     1,000 mg 200 mL/hr over 60 Minutes Intravenous Every 8 hours 06/14/18 2216 06/15/18 0711   06/15/18 0500  cefTRIAXone (ROCEPHIN) 2 g in sodium chloride 0.9 % 100 mL IVPB  Status:  Discontinued     2 g 200 mL/hr over  30 Minutes Intravenous Every 24 hours 06/15/18 0445 06/17/18 1046   06/14/18 2200  azithromycin (ZITHROMAX) 500 mg in sodium chloride 0.9 % 250 mL IVPB  Status:  Discontinued     500 mg 250 mL/hr over 60 Minutes Intravenous Every 24 hours 06/14/18 2134 06/15/18 0711   06/14/18 2200  cefTRIAXone (ROCEPHIN) 1 g in sodium chloride 0.9 % 100 mL IVPB  Status:  Discontinued     1 g 200 mL/hr over 30 Minutes Intravenous Every 24 hours 06/14/18 2134 06/15/18 0445   06/14/18 2200  vancomycin (VANCOCIN) 1,500 mg in sodium chloride 0.9 % 500 mL IVPB     1,500 mg 250 mL/hr over 120 Minutes Intravenous  Once 06/14/18 2134 06/15/18 0235     Subjective: Seen and examined at bedside and states she is still nauseous but nothing new. No CP, SOB, lightheadedness or dizziness. No other concerns or complaints at this time.   Objective: Vitals:   07/28/18 1710 07/29/18 0023 07/29/18 0218 07/29/18 0732  BP: 102/80 112/71  112/71  Pulse: (!) 109 98  90  Resp: 18   16  Temp: 98.5 F (36.9 C) 97.8 F (36.6 C)  (!) 97.5 F (36.4 C)  TempSrc:  Oral  Oral  SpO2: 98% 95%  97%  Weight:   83.6 kg   Height:        Intake/Output Summary (Last 24 hours) at 07/29/2018 1031 Last data filed at 07/29/2018 0600 Gross per 24 hour  Intake 5190.1 ml  Output --  Net 5190.1 ml   Filed Weights   07/23/18 0505 07/24/18 0500 07/29/18 0218  Weight: 82.6 kg 82.6 kg 83.6 kg   Examination: Physical Exam:  Constitutional:  Well-nourished, well-developed pregnant Caucasian female currently no acute distress appears calm and comfortable watching television Eyes:  Lids and conjunctive are normal.  Sclera anicteric ENMT:  External ears and nose appear normal.  Grossly normal hearing.  Poor dentition Neck:  Appears supple no JVD Respiratory:  Diminished auscultation bilaterally no appreciable wheezing, rales, rhonchi.  Patient not tachypneic or using any accessory muscles to breathe Cardiovascular: Regular rate and rhythm.   No appreciable murmurs, rubs, gallops Abdomen:  Soft, nontender, distended being pregnant.  Bowel sounds positive x4 GU: Deferred Musculoskeletal: No contractures or cyanosis.  No joint deformities in the upper and lower extremities Skin:  No appreciable rashes or lesions on limited skin evaluation. Neurologic: Cranial nerves II through XII grossly intact no appreciable focal deficits. Psychiatric:  Normal judgment and insight.  Patient is awake, alert, and oriented x3.  Pleasant mood and affect  Data Reviewed: I have personally  reviewed following labs and imaging studies  CBC: Recent Labs  Lab 07/24/18 0639 07/24/18 1331 07/25/18 0400 07/26/18 0500 07/29/18 0310  WBC 5.7 5.9 6.9 7.2 7.3  NEUTROABS 3.9 4.3 4.7 4.9 5.3  HGB 7.6* 8.8* 9.3* 9.6* 9.3*  HCT 25.6* 29.5* 30.5* 31.9* 29.4*  MCV 91.1 91.3 89.7 89.6 89.6  PLT 189 373 398 415* 347   Basic Metabolic Panel: Recent Labs  Lab 07/23/18 0839 07/24/18 1331 07/25/18 0400 07/26/18 0500 07/29/18 0310  NA 135 137 135  --  137  K 3.7 3.0* 3.9  --  3.6  CL 101 109 101  --  104  CO2 25 20* 25  --  24  GLUCOSE 105* 92 86  --  137*  BUN 8 8 10   --  11  CREATININE 0.48 0.38* 0.43*  --  0.41*  CALCIUM 9.3 7.4* 9.0  --  9.2  MG 1.6* 1.3* 1.5* 1.7 1.5*  PHOS 3.8 3.4 4.3  --  4.3   GFR: Estimated Creatinine Clearance: 111.2 mL/min (A) (by C-G formula based on SCr of 0.41 mg/dL (L)). Liver Function Tests: Recent Labs  Lab 07/23/18 0839 07/24/18 1331 07/25/18 0400 07/29/18 0310  AST 28 26 24 30   ALT 19 16 16 22   ALKPHOS 192* 158* 160* 157*  BILITOT 0.9 0.6 0.7 0.6  PROT 6.8 5.9* 6.2* 6.4*  ALBUMIN 2.4* 2.0* 2.2* 2.3*   No results for input(s): LIPASE, AMYLASE in the last 168 hours. No results for input(s): AMMONIA in the last 168 hours. Coagulation Profile: No results for input(s): INR, PROTIME in the last 168 hours. Cardiac Enzymes: No results for input(s): CKTOTAL, CKMB, CKMBINDEX, TROPONINI in the last 168  hours. BNP (last 3 results) No results for input(s): PROBNP in the last 8760 hours. HbA1C: No results for input(s): HGBA1C in the last 72 hours. CBG: No results for input(s): GLUCAP in the last 168 hours. Lipid Profile: No results for input(s): CHOL, HDL, LDLCALC, TRIG, CHOLHDL, LDLDIRECT in the last 72 hours. Thyroid Function Tests: No results for input(s): TSH, T4TOTAL, FREET4, T3FREE, THYROIDAB in the last 72 hours. Anemia Panel: No results for input(s): VITAMINB12, FOLATE, FERRITIN, TIBC, IRON, RETICCTPCT in the last 72 hours. Sepsis Labs: No results for input(s): PROCALCITON, LATICACIDVEN in the last 168 hours.  No results found for this or any previous visit (from the past 240 hour(s)).   Radiology Studies: No results found.  Scheduled Meds:  chlorhexidine  15 mL Mouth/Throat BID   enoxaparin (LOVENOX) injection  40 mg Subcutaneous Q24H   feeding supplement (ENSURE ENLIVE)  237 mL Oral BID BM   Gerhardt's butt cream   Topical BID   methadone  40 mg Oral q1800   methadone  60 mg Oral Daily   nystatin   Topical BID   pantoprazole  40 mg Oral BID   prenatal multivitamin  1 tablet Oral Q1200   sodium chloride flush  10-40 mL Intracatheter Q12H   Continuous Infusions:  sodium chloride 10 mL/hr at 07/19/18 0400   ampicillin (OMNIPEN) IV 2 g (07/29/18 0844)   cefTRIAXone (ROCEPHIN)  IV 2 g (07/29/18 0846)   ferumoxytol 510 mg (07/22/18 2042)   lactated ringers 10 mL/hr at 07/03/18 2101   magnesium sulfate 1 - 4 g bolus IVPB 2 g (07/29/18 0947)   vancomycin 1,000 mg (07/29/18 0311)    LOS: 47 days   Kerney Elbe, DO [facility] PAGER is on AMION  If 7PM-7AM, please contact night-coverage [email or web address] Password  [email or web address] 07/29/2018, 10:31 AM

## 2018-07-30 LAB — VANCOMYCIN, TROUGH: Vancomycin Tr: 10 ug/mL — ABNORMAL LOW (ref 15–20)

## 2018-07-30 MED ORDER — METHADONE HCL 10 MG PO TABS
100.0000 mg | ORAL_TABLET | Freq: Every day | ORAL | Status: DC
  Administered 2018-07-31 – 2018-08-04 (×5): 100 mg via ORAL
  Filled 2018-07-30 (×6): qty 10

## 2018-07-30 NOTE — Progress Notes (Signed)
Daily FHR NST completed. Pt denies vaginal bleeding, LOF, or UCs and reports good fetal movement.  Reports "she is getting stronger... I can see her kicks now."

## 2018-07-30 NOTE — Progress Notes (Signed)
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 Patient is  Pregnant with an approx due date 11/01/2018.    Subjective:  No complaints.   Assessment & Plan:   Principal Problem:   Bacteremia due to Enterococcus and Streptococcus- bacterial endocarditis Tricuspid valve, Multifocal pneumonia and Influenza B - -Continue IV ceftriaxone, vancomycin, ampicillin - she needs IV antibiotics through 08/03/18 - Cardiothoracic surgery consulted, currently not a surgical candidate - has had teeth removed on 2/20   Active Problems:   High-risk pregnancy  - OB RN monitoring f     Opioid use disorder, severe, dependence  - continue Methadone 60 mg in AM and 40 mg in PM - will see if we can transition to daily dosing to prepare her for d/c  Time spent in minutes: stable DVT prophylaxis: lovenox Code Status: Full code Family Communication: none at bedside Disposition Plan: will need to be in hospital for IV antibiotics Consultants:    ID  CT surgery  Dental surgery  OB/Gyn Procedures:   06/16/2018 echo: 1. Tricuspid regurgitation severe by color flow Doppler. There is a large mobile tricuspid valve vegetation on the anterior leaflet. The TV vegetation measures 10 mm x 20 mm. 2. The left ventricle has normal systolic function of 62-26%. The cavity size is normal. There is no left ventricular wall thickness. Echo evidence of normal diastolic filling patterns. 3. The right  ventricle is moderately enlarged in size. There is normal systolic function. Right ventricular systolic pressure is normal with an estimated pressure of 43.8 mmHg. 4. Mildly dilated left atrial size. 5. Moderately dilated right atrial size. 6. The mitral valve is myxomatous. There is mild thickening. Regurgitation is not visualized by color flow Doppler. 7. Large vegetation on the tricuspid valve. 8. Myxomatous tricuspid valve. 9. Tricuspid regurgitation severe.  10. No atrial level shunt detected by color flow Doppler. Antimicrobials:  Anti-infectives (From admission, onward)   Start     Dose/Rate Route Frequency Ordered Stop   07/28/18 1100  vancomycin (VANCOCIN) 1,000 mg in sodium chloride 0.9 % 250 mL IVPB     1,000 mg 250 mL/hr over 60 Minutes Intravenous Every 8 hours 07/28/18 1020 08/04/18 0259   07/28/18 0000  vancomycin (VANCOCIN) 1,000 mg in sodium chloride 0.9 % 250 mL IVPB  Status:  Discontinued     1,000 mg 250 mL/hr over 60 Minutes Intravenous Every 8 hours 07/27/18 2357 07/28/18 1020   06/26/18 0100  vancomycin (VANCOCIN) IVPB 1000 mg/200 mL premix  Status:  Discontinued     1,000 mg 200 mL/hr over 60 Minutes Intravenous Every 8 hours 06/25/18 1758 07/27/18 2357   06/23/18 0000  vancomycin (VANCOCIN) 1,250 mg in sodium chloride 0.9 % 250 mL IVPB  Status:  Discontinued     1,250 mg 166.7 mL/hr over 90 Minutes Intravenous Every 8 hours 06/22/18 2332 06/25/18 1758   06/20/18 1000  vancomycin (VANCOCIN) 2,000 mg in sodium chloride 0.9 % 500 mL IVPB  Status:  Discontinued     2,000 mg 250 mL/hr over 120 Minutes Intravenous  Every 12 hours 06/19/18 2221 06/22/18 2332   06/18/18 2000  vancomycin (VANCOCIN) IVPB 1000 mg/200 mL premix  Status:  Discontinued     1,000 mg 200 mL/hr over 60 Minutes Intravenous Every 8 hours 06/18/18 1054 06/19/18 2221   06/18/18 1200  vancomycin (VANCOCIN) 1,750 mg in sodium chloride 0.9 % 500 mL IVPB     1,750 mg 250 mL/hr over 120 Minutes  Intravenous  Once 06/18/18 1054 06/18/18 2241   06/17/18 2200  cefTRIAXone (ROCEPHIN) 2 g in sodium chloride 0.9 % 100 mL IVPB     2 g 200 mL/hr over 30 Minutes Intravenous Every 12 hours 06/17/18 1046 08/04/18 0959   06/17/18 1200  ampicillin (OMNIPEN) 2 g in sodium chloride 0.9 % 100 mL IVPB     2 g 300 mL/hr over 20 Minutes Intravenous Every 4 hours 06/17/18 1045 08/03/18 2359   06/16/18 1300  oseltamivir (TAMIFLU) capsule 75 mg     75 mg Oral 2 times daily 06/16/18 1231 06/20/18 0919   06/15/18 0700  vancomycin (VANCOCIN) IVPB 1000 mg/200 mL premix  Status:  Discontinued     1,000 mg 200 mL/hr over 60 Minutes Intravenous Every 8 hours 06/14/18 2216 06/15/18 0711   06/15/18 0500  cefTRIAXone (ROCEPHIN) 2 g in sodium chloride 0.9 % 100 mL IVPB  Status:  Discontinued     2 g 200 mL/hr over 30 Minutes Intravenous Every 24 hours 06/15/18 0445 06/17/18 1046   06/14/18 2200  azithromycin (ZITHROMAX) 500 mg in sodium chloride 0.9 % 250 mL IVPB  Status:  Discontinued     500 mg 250 mL/hr over 60 Minutes Intravenous Every 24 hours 06/14/18 2134 06/15/18 0711   06/14/18 2200  cefTRIAXone (ROCEPHIN) 1 g in sodium chloride 0.9 % 100 mL IVPB  Status:  Discontinued     1 g 200 mL/hr over 30 Minutes Intravenous Every 24 hours 06/14/18 2134 06/15/18 0445   06/14/18 2200  vancomycin (VANCOCIN) 1,500 mg in sodium chloride 0.9 % 500 mL IVPB     1,500 mg 250 mL/hr over 120 Minutes Intravenous  Once 06/14/18 2134 06/15/18 0235       Objective: Vitals:   07/29/18 2351 07/30/18 0439 07/30/18 0900 07/30/18 1700  BP: 107/73  101/69 111/74  Pulse: 90  86 96  Resp:   16   Temp: (!) 97.4 F (36.3 C)  (!) 97.5 F (36.4 C)   TempSrc: Oral  Oral   SpO2: 97%  98% 99%  Weight:  87.6 kg    Height:        Intake/Output Summary (Last 24 hours) at 07/30/2018 1801 Last data filed at 07/30/2018 1700 Gross per 24 hour  Intake 540 ml  Output -  Net 540 ml   Filed Weights   07/24/18 0500 07/29/18 0218  07/30/18 0439  Weight: 82.6 kg 83.6 kg 87.6 kg    Examination: General exam: Appears comfortable  HEENT: PERRLA, oral mucosa moist, no sclera icterus or thrush Respiratory system: Clear to auscultation. Respiratory effort normal. Cardiovascular system: S1 & S2 heard,  No murmurs  Gastrointestinal system: Abdomen soft, non-tender, nondistended. Normal bowel sounds   Central nervous system: Alert and oriented. No focal neurological deficits. Extremities: No cyanosis, clubbing or edema Skin: No rashes or ulcers Psychiatry:  Mood & affect appropriate.   Data Reviewed: I have personally reviewed following labs and imaging studies  CBC: Recent Labs  Lab 07/24/18 0639 07/24/18 1331 07/25/18 0400 07/26/18 0500 07/29/18 0310  WBC 5.7 5.9  6.9 7.2 7.3  NEUTROABS 3.9 4.3 4.7 4.9 5.3  HGB 7.6* 8.8* 9.3* 9.6* 9.3*  HCT 25.6* 29.5* 30.5* 31.9* 29.4*  MCV 91.1 91.3 89.7 89.6 89.6  PLT 189 373 398 415* 562   Basic Metabolic Panel: Recent Labs  Lab 07/24/18 1331 07/25/18 0400 07/26/18 0500 07/29/18 0310  NA 137 135  --  137  K 3.0* 3.9  --  3.6  CL 109 101  --  104  CO2 20* 25  --  24  GLUCOSE 92 86  --  137*  BUN 8 10  --  11  CREATININE 0.38* 0.43*  --  0.41*  CALCIUM 7.4* 9.0  --  9.2  MG 1.3* 1.5* 1.7 1.5*  PHOS 3.4 4.3  --  4.3   GFR: Estimated Creatinine Clearance: 113.7 mL/min (A) (by C-G formula based on SCr of 0.41 mg/dL (L)). Liver Function Tests: Recent Labs  Lab 07/24/18 1331 07/25/18 0400 07/29/18 0310  AST 26 24 30   ALT 16 16 22   ALKPHOS 158* 160* 157*  BILITOT 0.6 0.7 0.6  PROT 5.9* 6.2* 6.4*  ALBUMIN 2.0* 2.2* 2.3*   No results for input(s): LIPASE, AMYLASE in the last 168 hours. No results for input(s): AMMONIA in the last 168 hours. Coagulation Profile: No results for input(s): INR, PROTIME in the last 168 hours. Cardiac Enzymes: No results for input(s): CKTOTAL, CKMB, CKMBINDEX, TROPONINI in the last 168 hours. BNP (last 3 results) No results  for input(s): PROBNP in the last 8760 hours. HbA1C: No results for input(s): HGBA1C in the last 72 hours. CBG: No results for input(s): GLUCAP in the last 168 hours. Lipid Profile: No results for input(s): CHOL, HDL, LDLCALC, TRIG, CHOLHDL, LDLDIRECT in the last 72 hours. Thyroid Function Tests: No results for input(s): TSH, T4TOTAL, FREET4, T3FREE, THYROIDAB in the last 72 hours. Anemia Panel: No results for input(s): VITAMINB12, FOLATE, FERRITIN, TIBC, IRON, RETICCTPCT in the last 72 hours. Urine analysis:    Component Value Date/Time   COLORURINE YELLOW 06/26/2018 1130   APPEARANCEUR CLEAR 06/26/2018 1130   APPEARANCEUR Hazy 07/10/2013 1121   LABSPEC 1.006 06/26/2018 1130   LABSPEC 1.020 07/10/2013 1121   PHURINE 7.0 06/26/2018 1130   GLUCOSEU NEGATIVE 06/26/2018 1130   GLUCOSEU Negative 07/10/2013 1121   HGBUR NEGATIVE 06/26/2018 1130   BILIRUBINUR NEGATIVE 06/26/2018 1130   BILIRUBINUR Negative 07/10/2013 1121   KETONESUR NEGATIVE 06/26/2018 1130   PROTEINUR NEGATIVE 06/26/2018 1130   UROBILINOGEN 1.0 02/15/2015 0530   NITRITE NEGATIVE 06/26/2018 1130   LEUKOCYTESUR NEGATIVE 06/26/2018 1130   LEUKOCYTESUR Trace 07/10/2013 1121   Sepsis Labs: @LABRCNTIP (procalcitonin:4,lacticidven:4) ) No results found for this or any previous visit (from the past 240 hour(s)).       Radiology Studies: No results found.    Scheduled Meds: . chlorhexidine  15 mL Mouth/Throat BID  . enoxaparin (LOVENOX) injection  40 mg Subcutaneous Q24H  . feeding supplement (ENSURE ENLIVE)  237 mL Oral BID BM  . Gerhardt's butt cream   Topical BID  . methadone  40 mg Oral q1800  . methadone  60 mg Oral Daily  . nystatin   Topical BID  . pantoprazole  40 mg Oral BID  . prenatal multivitamin  1 tablet Oral Q1200  . sodium chloride flush  10-40 mL Intracatheter Q12H   Continuous Infusions: . sodium chloride 10 mL/hr at 07/19/18 0400  . ampicillin (OMNIPEN) IV 2 g (07/30/18 1715)  .  cefTRIAXone (ROCEPHIN)  IV 2 g (07/30/18 0924)  .  lactated ringers 10 mL/hr at 07/03/18 2101  . vancomycin 1,000 mg (07/30/18 1215)     LOS: 45 days      Debbe Odea, MD [facility] Pager: [email or web address] Password [email or web address] 07/30/2018, 6:01 PM

## 2018-07-30 NOTE — Clinical Social Work Note (Signed)
Pt was interviewed and accepted yesterday by "[facility]residential treatment center in [place] following d/c. CSW will continue to follow.  [place], [place]

## 2018-07-30 NOTE — Clinical Social Work Note (Signed)
High risk readmission screening completed at this time. Pt has been accepted into a residential treatment facility at d/c. Pt independent and sets up all appointments. Pt utilizes Medicaid transport. Pt denies needing any further resources at this time.  [place], [place]

## 2018-07-30 NOTE — Progress Notes (Signed)
Pharmacy Antibiotic Note  Kelly [name] is a 33 y.o. female admitted on 06/14/2018 with Strep and Enterococcal endocarditis.  Patient continues on  IV vancomycin, ampicillin and ceftriaxone.  These three IV antibiotics are to continue x 6 weeks per ID recommendation, through 08/03/18.  Patient to stay inpatient for duration of IV antibiotic therapy.   Vanc trough came back stable. She only has a few days left to complete her 6 wks of abx.   Plan:  Continue Vancomycin 1gm IV q8h til 3/23  Cont Ceftriaxone/amp til 3/23  Height: 5' 7"  (170.2 cm) Weight: 193 lb 2 oz (87.6 kg) IBW/kg (Calculated) : 61.6  Temp (24hrs), Avg:97.6 F (36.4 C), Min:97.4 F (36.3 C), Max:98 F (36.7 C)  Recent Labs  Lab 07/24/18 0639 07/24/18 1331 07/25/18 0400 07/26/18 0500 07/29/18 0310 07/30/18 1059  WBC 5.7 5.9 6.9 7.2 7.3  --   CREATININE  --  0.38* 0.43*  --  0.41*  --   VANCOTROUGH  --   --   --   --   --  10*    Estimated Creatinine Clearance: 113.7 mL/min (A) (by C-G formula based on SCr of 0.41 mg/dL (L)).    Allergies  Allergen Reactions  . Hydrocodone Itching  . Morphine And Related Nausea And Vomiting    Azithromycin 2/1>>2/2 Vancomcyin 2/1>>2/2, 2/5>>(3/22) Ceftriaxone 2/1>>(3/22) Ampicillin 2/4>>(3/22) Tamiflu 2/3>>2/7  2/6: Levels 19/11 (5 hours apart) -  on 1gm q8h - AUC low at 389 on this dose >> dose adjusted to 2g IV q12h for estimated AUC 517 2/9: Levels 39/9 (12 hrs apart) - on Vanc 2gm IV q12h - >AUC supratherapeutic at 597 (goal 400-550) > adjusted to 1250 mg IV q8h for estimated AUC 556 - Calculated Ke 0.2185, half-life 3.2 hrs 2/12: VP/VT 30/14, AUC 577 on 1265m q8 >> 1g q8 2/17: VP/VT 19/13, AUC 505.6 on [id number], ke 0.1198, t1/2 5.8h - cont 1000 mg q8h 2/25: VP/VT 29/13, AUC 531.8 on 1000 mg q8h, ke 0.138, t1/2 5.0h-continue 1000 mg q8h 3/3: VP 26/VT 13, AUC 535 on 1gm IV q8h - Ke 0.146, t 1/2 4.7 hrs. - continue 1gm q8h 3/10: VP 24/VT 10, AUC 471.7 on 1gm  IV q8h - Ke 0.169, t 1/2 4.1 hrs. - continue 1gm q8h   2/1 Blood x 2: Enterococcus faecalis (pan-S), Strep sanguinis 1/2 (R-PCN and CTX, S-Vanc) and strep mitis/oralis (R-tetra, I-PCN, S-Vanc/Clinda/CTX) 2/1 Urine: E coli - sensitive to cefazolin, ceftriaxone; R - amp, cipro, septra, unasyn, zosyn 2/3 Influenza PCR - positive B 2/3 Blood x 2: negative 2/7 RPR negative 2/7 HIV non-reactive 2/7 Rubella < 0.90 = non-immune 2/13 urine - negative 2/20 MRSA PCR: negative  MiOnnie BoerPharmD, BCIDP, AAHIVP, CPP Infectious Disease Pharmacist 07/30/2018 1:24 PM

## 2018-07-31 NOTE — Progress Notes (Signed)
Daily NST completed by RROB.  Pt has no complaints at this time.  Audible fetal movement noted. MD made aware.

## 2018-07-31 NOTE — Progress Notes (Signed)
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 Patient is  Pregnant with an approx due date 11/01/2018.    Subjective: She has no complaints.   Assessment & Plan:   Principal Problem:   Bacteremia due to Enterococcus and Streptococcus- bacterial endocarditis Tricuspid valve, Multifocal pneumonia and Influenza B - -Continue IV ceftriaxone, vancomycin, ampicillin - she needs IV antibiotics through 08/03/18- expect discharge on 3/23 - Cardiothoracic surgery consulted, currently not a surgical candidate - has had teeth removed on 2/20  - quite stable  Active Problems:   High-risk pregnancy  - OB RN monitoring     Opioid use disorder, severe, dependence  - continue Methadone 60 mg in AM and 40 mg in PM - Methadone has been transitioned to daily dosing to prepare her for d/c  Time spent in minutes: stable DVT prophylaxis: lovenox Code Status: Full code Family Communication: none at bedside Disposition Plan: will need to be in hospital for IV antibiotics Consultants:    ID  CT surgery  Dental surgery  OB/Gyn Procedures:   06/16/2018 echo: 1. Tricuspid regurgitation severe by color flow Doppler. There is a large mobile tricuspid valve vegetation on the anterior leaflet. The TV vegetation measures 10 mm x 20 mm. 2. The left ventricle has normal systolic function of 58-52%. The cavity size is normal. There is no left ventricular wall thickness. Echo evidence of normal  diastolic filling patterns. 3. The right ventricle is moderately enlarged in size. There is normal systolic function. Right ventricular systolic pressure is normal with an estimated pressure of 43.8 mmHg. 4. Mildly dilated left atrial size. 5. Moderately dilated right atrial size. 6. The mitral valve is myxomatous. There is mild thickening. Regurgitation is not visualized by color flow Doppler. 7. Large vegetation on the tricuspid valve. 8. Myxomatous tricuspid valve. 9. Tricuspid regurgitation severe.  10. No atrial level shunt detected by color flow Doppler. Antimicrobials:  Anti-infectives (From admission, onward)   Start     Dose/Rate Route Frequency Ordered Stop   07/28/18 1100  vancomycin (VANCOCIN) 1,000 mg in sodium chloride 0.9 % 250 mL IVPB     1,000 mg 250 mL/hr over 60 Minutes Intravenous Every 8 hours 07/28/18 1020 08/04/18 0259   07/28/18 0000  vancomycin (VANCOCIN) 1,000 mg in sodium chloride 0.9 % 250 mL IVPB  Status:  Discontinued     1,000 mg 250 mL/hr over 60 Minutes Intravenous Every 8 hours 07/27/18 2357 07/28/18 1020   06/26/18 0100  vancomycin (VANCOCIN) IVPB 1000 mg/200 mL premix  Status:  Discontinued     1,000 mg 200 mL/hr over 60 Minutes Intravenous Every 8 hours 06/25/18 1758 07/27/18 2357   06/23/18 0000  vancomycin (VANCOCIN) 1,250 mg in sodium chloride 0.9 % 250 mL IVPB  Status:  Discontinued     1,250 mg 166.7 mL/hr over 90 Minutes Intravenous Every 8 hours 06/22/18 2332 06/25/18 1758   06/20/18 1000  vancomycin (VANCOCIN) 2,000 mg in sodium chloride 0.9 % 500 mL IVPB  Status:  Discontinued     2,000 mg 250  mL/hr over 120 Minutes Intravenous Every 12 hours 06/19/18 2221 06/22/18 2332   06/18/18 2000  vancomycin (VANCOCIN) IVPB 1000 mg/200 mL premix  Status:  Discontinued     1,000 mg 200 mL/hr over 60 Minutes Intravenous Every 8 hours 06/18/18 1054 06/19/18 2221   06/18/18 1200  vancomycin (VANCOCIN) 1,750 mg in sodium chloride 0.9 % 500 mL IVPB      1,750 mg 250 mL/hr over 120 Minutes Intravenous  Once 06/18/18 1054 06/18/18 2241   06/17/18 2200  cefTRIAXone (ROCEPHIN) 2 g in sodium chloride 0.9 % 100 mL IVPB     2 g 200 mL/hr over 30 Minutes Intravenous Every 12 hours 06/17/18 1046 08/04/18 0959   06/17/18 1200  ampicillin (OMNIPEN) 2 g in sodium chloride 0.9 % 100 mL IVPB     2 g 300 mL/hr over 20 Minutes Intravenous Every 4 hours 06/17/18 1045 08/03/18 2359   06/16/18 1300  oseltamivir (TAMIFLU) capsule 75 mg     75 mg Oral 2 times daily 06/16/18 1231 06/20/18 0919   06/15/18 0700  vancomycin (VANCOCIN) IVPB 1000 mg/200 mL premix  Status:  Discontinued     1,000 mg 200 mL/hr over 60 Minutes Intravenous Every 8 hours 06/14/18 2216 06/15/18 0711   06/15/18 0500  cefTRIAXone (ROCEPHIN) 2 g in sodium chloride 0.9 % 100 mL IVPB  Status:  Discontinued     2 g 200 mL/hr over 30 Minutes Intravenous Every 24 hours 06/15/18 0445 06/17/18 1046   06/14/18 2200  azithromycin (ZITHROMAX) 500 mg in sodium chloride 0.9 % 250 mL IVPB  Status:  Discontinued     500 mg 250 mL/hr over 60 Minutes Intravenous Every 24 hours 06/14/18 2134 06/15/18 0711   06/14/18 2200  cefTRIAXone (ROCEPHIN) 1 g in sodium chloride 0.9 % 100 mL IVPB  Status:  Discontinued     1 g 200 mL/hr over 30 Minutes Intravenous Every 24 hours 06/14/18 2134 06/15/18 0445   06/14/18 2200  vancomycin (VANCOCIN) 1,500 mg in sodium chloride 0.9 % 500 mL IVPB     1,500 mg 250 mL/hr over 120 Minutes Intravenous  Once 06/14/18 2134 06/15/18 0235       Objective: Vitals:   07/30/18 1700 07/30/18 2052 07/31/18 0510 07/31/18 1416  BP: 111/74 (!) 99/58 107/79 124/84  Pulse: 96 83 86 93  Resp:  18 16 16   Temp:  97.7 F (36.5 C) (!) 97.4 F (36.3 C) 98 F (36.7 C)  TempSrc:  Oral Oral Oral  SpO2: 99% 97% 94% 97%  Weight:      Height:        Intake/Output Summary (Last 24 hours) at 07/31/2018 1553 Last data filed at 07/31/2018 1142 Gross per 24 hour  Intake 1580 ml  Output -   Net 1580 ml   Filed Weights   07/24/18 0500 07/29/18 0218 07/30/18 0439  Weight: 82.6 kg 83.6 kg 87.6 kg    Examination: General exam: Appears comfortable  HEENT: PERRLA, oral mucosa moist, no sclera icterus or thrush Respiratory system: Clear to auscultation. Respiratory effort normal. Cardiovascular system: S1 & S2 heard,  No murmurs  Gastrointestinal system: Abdomen soft, non-tender, nondistended. Normal bowel sounds   Central nervous system: Alert and oriented. No focal neurological deficits. Extremities: No cyanosis, clubbing or edema Skin: No rashes or ulcers Psychiatry:  Mood & affect appropriate.    Data Reviewed: I have personally reviewed following labs and imaging studies  CBC: Recent Labs  Lab 07/25/18 0400 07/26/18 0500 07/29/18 0310  WBC 6.9 7.2 7.3  NEUTROABS 4.7 4.9 5.3  HGB 9.3* 9.6* 9.3*  HCT 30.5* 31.9* 29.4*  MCV 89.7 89.6 89.6  PLT 398 415* 790   Basic Metabolic Panel: Recent Labs  Lab 07/25/18 0400 07/26/18 0500 07/29/18 0310  NA 135  --  137  K 3.9  --  3.6  CL 101  --  104  CO2 25  --  24  GLUCOSE 86  --  137*  BUN 10  --  11  CREATININE 0.43*  --  0.41*  CALCIUM 9.0  --  9.2  MG 1.5* 1.7 1.5*  PHOS 4.3  --  4.3   GFR: Estimated Creatinine Clearance: 113.7 mL/min (A) (by C-G formula based on SCr of 0.41 mg/dL (L)). Liver Function Tests: Recent Labs  Lab 07/25/18 0400 07/29/18 0310  AST 24 30  ALT 16 22  ALKPHOS 160* 157*  BILITOT 0.7 0.6  PROT 6.2* 6.4*  ALBUMIN 2.2* 2.3*   No results for input(s): LIPASE, AMYLASE in the last 168 hours. No results for input(s): AMMONIA in the last 168 hours. Coagulation Profile: No results for input(s): INR, PROTIME in the last 168 hours. Cardiac Enzymes: No results for input(s): CKTOTAL, CKMB, CKMBINDEX, TROPONINI in the last 168 hours. BNP (last 3 results) No results for input(s): PROBNP in the last 8760 hours. HbA1C: No results for input(s): HGBA1C in the last 72 hours. CBG: No  results for input(s): GLUCAP in the last 168 hours. Lipid Profile: No results for input(s): CHOL, HDL, LDLCALC, TRIG, CHOLHDL, LDLDIRECT in the last 72 hours. Thyroid Function Tests: No results for input(s): TSH, T4TOTAL, FREET4, T3FREE, THYROIDAB in the last 72 hours. Anemia Panel: No results for input(s): VITAMINB12, FOLATE, FERRITIN, TIBC, IRON, RETICCTPCT in the last 72 hours. Urine analysis:    Component Value Date/Time   COLORURINE YELLOW 06/26/2018 1130   APPEARANCEUR CLEAR 06/26/2018 1130   APPEARANCEUR Hazy 07/10/2013 1121   LABSPEC 1.006 06/26/2018 1130   LABSPEC 1.020 07/10/2013 1121   PHURINE 7.0 06/26/2018 1130   GLUCOSEU NEGATIVE 06/26/2018 1130   GLUCOSEU Negative 07/10/2013 1121   HGBUR NEGATIVE 06/26/2018 1130   BILIRUBINUR NEGATIVE 06/26/2018 1130   BILIRUBINUR Negative 07/10/2013 1121   KETONESUR NEGATIVE 06/26/2018 1130   PROTEINUR NEGATIVE 06/26/2018 1130   UROBILINOGEN 1.0 02/15/2015 0530   NITRITE NEGATIVE 06/26/2018 1130   LEUKOCYTESUR NEGATIVE 06/26/2018 1130   LEUKOCYTESUR Trace 07/10/2013 1121   Sepsis Labs: @LABRCNTIP (procalcitonin:4,lacticidven:4) ) No results found for this or any previous visit (from the past 240 hour(s)).       Radiology Studies: No results found.    Scheduled Meds: . chlorhexidine  15 mL Mouth/Throat BID  . enoxaparin (LOVENOX) injection  40 mg Subcutaneous Q24H  . feeding supplement (ENSURE ENLIVE)  237 mL Oral BID BM  . Gerhardt's butt cream   Topical BID  . methadone  100 mg Oral Daily  . nystatin   Topical BID  . pantoprazole  40 mg Oral BID  . prenatal multivitamin  1 tablet Oral Q1200  . sodium chloride flush  10-40 mL Intracatheter Q12H   Continuous Infusions: . sodium chloride 10 mL/hr at 07/19/18 0400  . ampicillin (OMNIPEN) IV 2 g (07/31/18 1548)  . cefTRIAXone (ROCEPHIN)  IV 2 g (07/31/18 1009)  . lactated ringers 10 mL/hr at 07/03/18 2101  . vancomycin 1,000 mg (07/31/18 1010)     LOS: 72 days       Debbe Odea, MD [facility] Pager: [email or web address] Password [email or web address]  07/31/2018, 3:53 PM

## 2018-08-01 ENCOUNTER — Other Ambulatory Visit: Payer: Self-pay | Admitting: Obstetrics and Gynecology

## 2018-08-01 DIAGNOSIS — I330 Acute and subacute infective endocarditis: Secondary | ICD-10-CM

## 2018-08-01 LAB — BASIC METABOLIC PANEL
Anion gap: 7 (ref 5–15)
BUN: 9 mg/dL (ref 6–20)
CALCIUM: 9 mg/dL (ref 8.9–10.3)
CO2: 24 mmol/L (ref 22–32)
CREATININE: 0.49 mg/dL (ref 0.44–1.00)
Chloride: 104 mmol/L (ref 98–111)
GFR calc Af Amer: >60 mL/min (ref >60)
GFR calc non Af Amer: >60 mL/min (ref >60)
Glucose, Bld: 94 mg/dL (ref 70–99)
Potassium: 3.8 mmol/L (ref 3.5–5.1)
Sodium: 135 mmol/L (ref 135–145)

## 2018-08-01 NOTE — Progress Notes (Signed)
Orders only

## 2018-08-01 NOTE — Discharge Instructions (Signed)

## 2018-08-01 NOTE — Progress Notes (Addendum)
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 Patient is  Pregnant with an approx due date 11/01/2018.    Subjective: She has no complaints.   Assessment & Plan:   Principal Problem:   Bacteremia due to Enterococcus and Streptococcus- bacterial endocarditis Tricuspid valve, Multifocal pneumonia and Influenza B - -Continue IV ceftriaxone, vancomycin, ampicillin - she needs IV antibiotics through 08/03/18- expect discharge on 3/23 - Cardiothoracic surgery  Dr Servando Snare consulted, currently not a surgical candidate - has had teeth removed on 2/20 ( Dr Enrique Sack) - quite stable   Per last ID note:  "x__ Please pull PIC at completion of IV antibiotics Clinic Follow Up Appt: 08/05/2018 at [place] am with Dr Tommy Medal"  Active Problems:   High-risk pregnancy  - OB RN monitoring     Opioid use disorder, severe, dependence  - continue Methadone 60 mg in AM and 40 mg in PM - Methadone has been transitioned to daily dosing to prepare her for d/c -she is followed by methadone clinic , she has an appointment with methadone clinic on 3/24  Time spent in minutes: stable DVT prophylaxis: lovenox Code Status: Full code Family Communication: none at bedside Disposition Plan: will need to be in hospital for IV antibiotics, plan to discharge on 3/23, she is to discharge to a residential facility per social worker Consultants:    ID  CT surgery  Dr Servando Snare   Dental surgery Dr Enrique Sack   OB/Gyn  Procedures:   06/16/2018 echo: 1. Tricuspid regurgitation severe by color flow Doppler. There is a large mobile tricuspid valve vegetation on the anterior leaflet. The TV vegetation measures 10 mm x 20 mm. 2. The left ventricle has normal systolic function of 13-08%. The cavity size is normal. There is no left ventricular wall thickness. Echo evidence of normal diastolic filling patterns. 3. The right ventricle is moderately enlarged in size. There is normal systolic function. Right ventricular systolic pressure is normal with an estimated pressure of 43.8 mmHg. 4. Mildly dilated left atrial size. 5. Moderately dilated right atrial size. 6. The mitral valve is myxomatous. There is mild thickening. Regurgitation is not visualized by color flow Doppler. 7. Large vegetation on the tricuspid valve. 8. Myxomatous tricuspid valve. 9. Tricuspid regurgitation severe.  10. No atrial level shunt detected by color flow Doppler. Antimicrobials:  Anti-infectives (From admission, onward)   Start     Dose/Rate Route Frequency Ordered Stop   07/28/18 1100  vancomycin (VANCOCIN) 1,000 mg in sodium chloride 0.9 % 250 mL IVPB     1,000 mg 250 mL/hr over 60 Minutes Intravenous Every 8 hours 07/28/18 1020 08/04/18 0259   07/28/18 0000  vancomycin (VANCOCIN) 1,000 mg in sodium chloride 0.9 % 250 mL IVPB  Status:  Discontinued     1,000 mg 250 mL/hr over 60 Minutes Intravenous Every 8 hours 07/27/18 2357 07/28/18 1020   06/26/18 0100  vancomycin (VANCOCIN) IVPB 1000 mg/200 mL premix  Status:  Discontinued     1,000 mg 200 mL/hr over 60 Minutes Intravenous Every 8 hours  06/25/18 1758 07/27/18 2357   06/23/18 0000  vancomycin (VANCOCIN) 1,250 mg in sodium chloride 0.9 % 250 mL IVPB  Status:  Discontinued     1,250 mg 166.7 mL/hr over 90 Minutes Intravenous Every 8 hours 06/22/18 2332 06/25/18 1758   06/20/18 1000  vancomycin (VANCOCIN) 2,000 mg in sodium chloride 0.9 % 500 mL IVPB  Status:   Discontinued     2,000 mg 250 mL/hr over 120 Minutes Intravenous Every 12 hours 06/19/18 2221 06/22/18 2332   06/18/18 2000  vancomycin (VANCOCIN) IVPB 1000 mg/200 mL premix  Status:  Discontinued     1,000 mg 200 mL/hr over 60 Minutes Intravenous Every 8 hours 06/18/18 1054 06/19/18 2221   06/18/18 1200  vancomycin (VANCOCIN) 1,750 mg in sodium chloride 0.9 % 500 mL IVPB     1,750 mg 250 mL/hr over 120 Minutes Intravenous  Once 06/18/18 1054 06/18/18 2241   06/17/18 2200  cefTRIAXone (ROCEPHIN) 2 g in sodium chloride 0.9 % 100 mL IVPB     2 g 200 mL/hr over 30 Minutes Intravenous Every 12 hours 06/17/18 1046 08/04/18 0959   06/17/18 1200  ampicillin (OMNIPEN) 2 g in sodium chloride 0.9 % 100 mL IVPB     2 g 300 mL/hr over 20 Minutes Intravenous Every 4 hours 06/17/18 1045 08/03/18 2359   06/16/18 1300  oseltamivir (TAMIFLU) capsule 75 mg     75 mg Oral 2 times daily 06/16/18 1231 06/20/18 0919   06/15/18 0700  vancomycin (VANCOCIN) IVPB 1000 mg/200 mL premix  Status:  Discontinued     1,000 mg 200 mL/hr over 60 Minutes Intravenous Every 8 hours 06/14/18 2216 06/15/18 0711   06/15/18 0500  cefTRIAXone (ROCEPHIN) 2 g in sodium chloride 0.9 % 100 mL IVPB  Status:  Discontinued     2 g 200 mL/hr over 30 Minutes Intravenous Every 24 hours 06/15/18 0445 06/17/18 1046   06/14/18 2200  azithromycin (ZITHROMAX) 500 mg in sodium chloride 0.9 % 250 mL IVPB  Status:  Discontinued     500 mg 250 mL/hr over 60 Minutes Intravenous Every 24 hours 06/14/18 2134 06/15/18 0711   06/14/18 2200  cefTRIAXone (ROCEPHIN) 1 g in sodium chloride 0.9 % 100 mL IVPB  Status:  Discontinued     1 g 200 mL/hr over 30 Minutes Intravenous Every 24 hours 06/14/18 2134 06/15/18 0445   06/14/18 2200  vancomycin (VANCOCIN) 1,500 mg in sodium chloride 0.9 % 500 mL IVPB     1,500 mg 250 mL/hr over 120 Minutes Intravenous  Once 06/14/18 2134 06/15/18 0235       Objective: Vitals:   07/31/18 0510 07/31/18 1416 07/31/18  2054 08/01/18 0442  BP: 107/79 124/84 113/71   Pulse: 86 93 92   Resp: 16 16 17    Temp: (!) 97.4 F (36.3 C) 98 F (36.7 C) 97.7 F (36.5 C)   TempSrc: Oral Oral Oral   SpO2: 94% 97% 95%   Weight:    84.1 kg  Height:        Intake/Output Summary (Last 24 hours) at 08/01/2018 1015 Last data filed at 08/01/2018 0441 Gross per 24 hour  Intake 3377.64 ml  Output -  Net 3377.64 ml   Filed Weights   07/29/18 0218 07/30/18 0439 08/01/18 0442  Weight: 83.6 kg 87.6 kg 84.1 kg    Examination: General exam: Appears comfortable  HEENT: PERRLA, oral mucosa moist, no sclera icterus or thrush Respiratory system: Clear to auscultation. Respiratory effort normal. Cardiovascular system:  S1 & S2 heard,  No murmurs  Gastrointestinal system: Abdomen soft, non-tender, nondistended. Normal bowel sounds   Central nervous system: Alert and oriented. No focal neurological deficits. Extremities: No cyanosis, clubbing or edema Skin: No rashes or ulcers Psychiatry:  Mood & affect appropriate.    Data Reviewed: I have personally reviewed following labs and imaging studies  CBC: Recent Labs  Lab 07/26/18 0500 07/29/18 0310  WBC 7.2 7.3  NEUTROABS 4.9 5.3  HGB 9.6* 9.3*  HCT 31.9* 29.4*  MCV 89.6 89.6  PLT 415* 703   Basic Metabolic Panel: Recent Labs  Lab 07/26/18 0500 07/29/18 0310 08/01/18 0425  NA  --  137 135  K  --  3.6 3.8  CL  --  104 104  CO2  --  24 24  GLUCOSE  --  137* 94  BUN  --  11 9  CREATININE  --  0.41* 0.49  CALCIUM  --  9.2 9.0  MG 1.7 1.5*  --   PHOS  --  4.3  --    GFR: Estimated Creatinine Clearance: 111.5 mL/min (by C-G formula based on SCr of 0.49 mg/dL). Liver Function Tests: Recent Labs  Lab 07/29/18 0310  AST 30  ALT 22  ALKPHOS 157*  BILITOT 0.6  PROT 6.4*  ALBUMIN 2.3*   No results for input(s): LIPASE, AMYLASE in the last 168 hours. No results for input(s): AMMONIA in the last 168 hours. Coagulation Profile: No results for input(s):  INR, PROTIME in the last 168 hours. Cardiac Enzymes: No results for input(s): CKTOTAL, CKMB, CKMBINDEX, TROPONINI in the last 168 hours. BNP (last 3 results) No results for input(s): PROBNP in the last 8760 hours. HbA1C: No results for input(s): HGBA1C in the last 72 hours. CBG: No results for input(s): GLUCAP in the last 168 hours. Lipid Profile: No results for input(s): CHOL, HDL, LDLCALC, TRIG, CHOLHDL, LDLDIRECT in the last 72 hours. Thyroid Function Tests: No results for input(s): TSH, T4TOTAL, FREET4, T3FREE, THYROIDAB in the last 72 hours. Anemia Panel: No results for input(s): VITAMINB12, FOLATE, FERRITIN, TIBC, IRON, RETICCTPCT in the last 72 hours. Urine analysis:    Component Value Date/Time   COLORURINE YELLOW 06/26/2018 1130   APPEARANCEUR CLEAR 06/26/2018 1130   APPEARANCEUR Hazy 07/10/2013 1121   LABSPEC 1.006 06/26/2018 1130   LABSPEC 1.020 07/10/2013 1121   PHURINE 7.0 06/26/2018 1130   GLUCOSEU NEGATIVE 06/26/2018 1130   GLUCOSEU Negative 07/10/2013 1121   HGBUR NEGATIVE 06/26/2018 1130   BILIRUBINUR NEGATIVE 06/26/2018 1130   BILIRUBINUR Negative 07/10/2013 1121   KETONESUR NEGATIVE 06/26/2018 1130   PROTEINUR NEGATIVE 06/26/2018 1130   UROBILINOGEN 1.0 02/15/2015 0530   NITRITE NEGATIVE 06/26/2018 1130   LEUKOCYTESUR NEGATIVE 06/26/2018 1130   LEUKOCYTESUR Trace 07/10/2013 1121   Sepsis Labs: @LABRCNTIP (procalcitonin:4,lacticidven:4) ) No results found for this or any previous visit (from the past 240 hour(s)).       Radiology Studies: No results found.    Scheduled Meds: . chlorhexidine  15 mL Mouth/Throat BID  . enoxaparin (LOVENOX) injection  40 mg Subcutaneous Q24H  . feeding supplement (ENSURE ENLIVE)  237 mL Oral BID BM  . Gerhardt's butt cream   Topical BID  . methadone  100 mg Oral Daily  . nystatin   Topical BID  . pantoprazole  40 mg Oral BID  . prenatal multivitamin  1 tablet Oral Q1200  . sodium chloride flush  10-40 mL  Intracatheter Q12H   Continuous Infusions: . sodium chloride 10 mL/hr  at 07/19/18 0400  . ampicillin (OMNIPEN) IV 2 g (08/01/18 0756)  . cefTRIAXone (ROCEPHIN)  IV 2 g (08/01/18 0944)  . lactated ringers 10 mL/hr at 07/03/18 2101  . vancomycin 1,000 mg (08/01/18 0440)     LOS: 68 days      Florencia Reasons, MD [facility] Pager: [email or web address] Password [email or web address] 08/01/2018, 10:15 AM

## 2018-08-01 NOTE — Progress Notes (Signed)
FACULTY PRACTICE ANTEPARTUM PROGRESS NOTE  SHELANDA DUVALL is a 33 y.o. [id number] at 56w6dwho is admitted for bacterial endocarditis.  Estimated Date of Delivery: 11/01/18 Fetal presentation is unsure.  Length of Stay:  47 Days. Admitted 06/14/2018  Subjective:  Patient reports normal fetal movement.  She denies uterine contractions, denies bleeding and leaking of fluid per vagina.  Vitals:  Blood pressure 113/71, pulse 92, temperature 97.7 F (36.5 C), temperature source Oral, resp. rate 17, height 5' 7"  (1.702 m), weight 84.1 kg, last menstrual period 12/30/2017, SpO2 95 %, unknown if currently breastfeeding. Physical Examination: CONSTITUTIONAL: Well-developed, well-nourished female in no acute distress.  HENT:  Normocephalic, atraumatic, External right and left ear normal. Oropharynx is clear and moist EYES: Conjunctivae and EOM are normal. Pupils are equal, round, and reactive to light. No scleral icterus.  NECK: Normal range of motion, supple, no masses. SKIN: Skin is warm and dry. No rash noted. Not diaphoretic. No erythema. No pallor. [place] Alert and oriented to person, place, and time. Normal reflexes, muscle tone coordination. No cranial nerve deficit noted. PSYCHIATRIC: Normal mood and affect. Normal behavior. Normal judgment and thought content. CARDIOVASCULAR: Normal heart rate noted RESPIRATORY: Effort normal, no problems with respiration noted MUSCULOSKELETAL: Normal range of motion. No edema and no tenderness. ABDOMEN: Soft, nontender, nondistended, gravid. CERVIX: deferred  Fetal monitoring: reactive per OB RN Uterine activity: no contractions per hour  Results for orders placed or performed during the hospital encounter of 06/14/18 (from the past 48 hour(s))  Vancomycin, trough     Status: Abnormal   Collection Time: 07/30/18 10:59 AM  Result Value Ref Range   Vancomycin Tr 10 (L) 15 - 20 ug/mL    Comment: Performed at [facility] [address]  [place] [id number] Basic metabolic panel     Status: None   Collection Time: 08/01/18  4:25 AM  Result Value Ref Range   Sodium 135 135 - 145 mmol/L   Potassium 3.8 3.5 - 5.1 mmol/L   Chloride 104 98 - 111 mmol/L   CO2 24 22 - 32 mmol/L   Glucose, Bld 94 70 - 99 mg/dL   BUN 9 6 - 20 mg/dL   Creatinine, Ser 0.49 0.44 - 1.00 mg/dL   Calcium 9.0 8.9 - 10.3 mg/dL   GFR calc non Af Amer >60 >60 mL/min   GFR calc Af Amer >60 >60 mL/min   Anion gap 7 5 - 15    Comment: Performed at [facility] [address] [place] [address]   No results found.  Current scheduled medications . chlorhexidine  15 mL Mouth/Throat BID  . enoxaparin (LOVENOX) injection  40 mg Subcutaneous Q24H  . feeding supplement (ENSURE ENLIVE)  237 mL Oral BID BM  . Gerhardt's butt cream   Topical BID  . methadone  100 mg Oral Daily  . nystatin   Topical BID  . pantoprazole  40 mg Oral BID  . prenatal multivitamin  1 tablet Oral Q1200  . sodium chloride flush  10-40 mL Intracatheter Q12H    I have reviewed the patient's current medications.  ASSESSMENT: Principal Problem:   Bacteremia due to Enterococcus Active Problems:   Opioid use disorder, severe, dependence (HCC)   Sepsis due to pneumonia ([place]   IVDU (intravenous drug user)   Multifocal pneumonia   Hyponatremia   Elevated transaminase level   Degenerative arthritis of lumbar spine   History of bacterial endocarditis  Severe tricuspid regurgitation by prior echocardiogram   Influenza B   Rubella non-immune status, antepartum   High-risk pregnancy in second trimester   Preexisting hypertension complicating pregnancy, antepartum   Dental caries   Phobia of dental procedure   Endocarditis   Infection due to Streptococcus mitis group   [redacted] weeks gestation of pregnancy   Abdominal distention   Acute bilateral low back pain   Hematoma   PICC (peripherally inserted central catheter) flush   PLAN:  Cont care per primary  team Discharge home per hospitalist service Our office will call to set patient up with [facility] appointment Reviewed instructions with patient to present to MAU for obstetric issues Please see AVS for f/u instructions for OB care   K. Arvilla Meres, M.D. [facility]ending [facility] [employer] (Faculty Practice)  08/01/2018 10:15 AM

## 2018-08-02 MED ORDER — DOCUSATE SODIUM 100 MG PO CAPS
100.0000 mg | ORAL_CAPSULE | Freq: Two times a day (BID) | ORAL | Status: DC
  Administered 2018-08-02 – 2018-08-04 (×5): 100 mg via ORAL
  Filled 2018-08-02 (×5): qty 1

## 2018-08-02 NOTE — Progress Notes (Signed)
DAY SHIFT AND NIGHT SHIFT UNABLE TO MONITOR PT; RROB SPOKE WITH PT'S NURSE; PT IS SLEEPING; DAY SHIFT RROB WILL COMPLETE NST

## 2018-08-02 NOTE — Progress Notes (Signed)
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 Patient is  Pregnant with an approx due date 11/01/2018.    Subjective: She has no complaints.   Assessment & Plan:   Principal Problem:   Bacteremia due to Enterococcus and Streptococcus- bacterial endocarditis Tricuspid valve, Multifocal pneumonia and Influenza B - -Continue IV ceftriaxone, vancomycin, ampicillin - she needs IV antibiotics through 08/03/18- expect discharge on 3/23 - Cardiothoracic surgery  Dr Servando Snare consulted, currently not a surgical candidate - has had teeth removed on 2/20 ( Dr Enrique Sack) - quite stable   Per last ID note:  "x__ Please pull PIC at completion of IV antibiotics Clinic Follow Up Appt: 08/05/2018 at [place] am with Dr Tommy Medal"  Active Problems:   High-risk pregnancy  - OB RN monitoring     Opioid use disorder, severe, dependence  - continue Methadone 60 mg in AM and 40 mg in PM - Methadone has been transitioned to daily dosing to prepare her for d/c -she is followed by methadone clinic , she has an appointment with methadone clinic on 3/24, she reports has to be there at 5:30 am, she is concerned about transportation issues, will consult social worker.  Time spent in minutes: stable DVT prophylaxis: lovenox Code Status: Full code Family Communication: none at bedside Disposition Plan: will need to be in hospital for IV antibiotics, plan to discharge on 3/23, she is to discharge to a  residential facility per social worker Consultants:    ID  CT surgery  Dr Servando Snare   Dental surgery Dr Enrique Sack  OB/Gyn  Procedures:   06/16/2018 echo: 1. Tricuspid regurgitation severe by color flow Doppler. There is a large mobile tricuspid valve vegetation on the anterior leaflet. The TV vegetation measures 10 mm x 20 mm. 2. The left ventricle has normal systolic function of 02-72%. The cavity size is normal. There is no left ventricular wall thickness. Echo evidence of normal diastolic filling patterns. 3. The right ventricle is moderately enlarged in size. There is normal systolic function. Right ventricular systolic pressure is normal with an estimated pressure of 43.8 mmHg. 4. Mildly dilated left atrial size. 5. Moderately dilated right atrial size. 6. The mitral valve is myxomatous. There is mild thickening. Regurgitation is not visualized by color flow Doppler. 7. Large vegetation on the tricuspid valve. 8. Myxomatous tricuspid valve. 9. Tricuspid regurgitation severe.  10. No atrial level shunt detected by color flow Doppler. Antimicrobials:  Anti-infectives (From admission, onward)   Start     Dose/Rate Route Frequency Ordered Stop   07/28/18 1100  vancomycin (VANCOCIN) 1,000 mg in sodium chloride 0.9 % 250 mL IVPB     1,000 mg 250 mL/hr over 60 Minutes Intravenous Every 8 hours 07/28/18 1020 08/04/18 0259   07/28/18 0000  vancomycin (VANCOCIN) 1,000 mg in sodium chloride 0.9 % 250 mL IVPB  Status:  Discontinued     1,000 mg 250 mL/hr over 60 Minutes Intravenous Every 8 hours 07/27/18 2357 07/28/18 1020   06/26/18 0100  vancomycin (VANCOCIN) IVPB 1000 mg/200 mL premix  Status:  Discontinued     1,000 mg 200 mL/hr over 60 Minutes Intravenous Every 8 hours 06/25/18 1758 07/27/18 2357   06/23/18 0000  vancomycin (VANCOCIN) 1,250 mg in sodium chloride 0.9 % 250 mL IVPB  Status:  Discontinued     1,250 mg 166.7 mL/hr over 90 Minutes Intravenous Every 8 hours  06/22/18 2332 06/25/18 1758   06/20/18 1000  vancomycin (VANCOCIN) 2,000 mg in sodium chloride 0.9 % 500 mL IVPB  Status:  Discontinued     2,000 mg 250 mL/hr over 120 Minutes Intravenous Every 12 hours 06/19/18 2221 06/22/18 2332   06/18/18 2000  vancomycin (VANCOCIN) IVPB 1000 mg/200 mL premix  Status:  Discontinued     1,000 mg 200 mL/hr over 60 Minutes Intravenous Every 8 hours 06/18/18 1054 06/19/18 2221   06/18/18 1200  vancomycin (VANCOCIN) 1,750 mg in sodium chloride 0.9 % 500 mL IVPB     1,750 mg 250 mL/hr over 120 Minutes Intravenous  Once 06/18/18 1054 06/18/18 2241   06/17/18 2200  cefTRIAXone (ROCEPHIN) 2 g in sodium chloride 0.9 % 100 mL IVPB     2 g 200 mL/hr over 30 Minutes Intravenous Every 12 hours 06/17/18 1046 08/04/18 0959   06/17/18 1200  ampicillin (OMNIPEN) 2 g in sodium chloride 0.9 % 100 mL IVPB     2 g 300 mL/hr over 20 Minutes Intravenous Every 4 hours 06/17/18 1045 08/03/18 2359   06/16/18 1300  oseltamivir (TAMIFLU) capsule 75 mg     75 mg Oral 2 times daily 06/16/18 1231 06/20/18 0919   06/15/18 0700  vancomycin (VANCOCIN) IVPB 1000 mg/200 mL premix  Status:  Discontinued     1,000 mg 200 mL/hr over 60 Minutes Intravenous Every 8 hours 06/14/18 2216 06/15/18 0711   06/15/18 0500  cefTRIAXone (ROCEPHIN) 2 g in sodium chloride 0.9 % 100 mL IVPB  Status:  Discontinued     2 g 200 mL/hr over 30 Minutes Intravenous Every 24 hours 06/15/18 0445 06/17/18 1046   06/14/18 2200  azithromycin (ZITHROMAX) 500 mg in sodium chloride 0.9 % 250 mL IVPB  Status:  Discontinued     500 mg 250 mL/hr over 60 Minutes Intravenous Every 24 hours 06/14/18 2134 06/15/18 0711   06/14/18 2200  cefTRIAXone (ROCEPHIN) 1 g in sodium chloride 0.9 % 100 mL IVPB  Status:  Discontinued     1 g 200 mL/hr over 30 Minutes Intravenous Every 24 hours 06/14/18 2134 06/15/18 0445   06/14/18 2200  vancomycin (VANCOCIN) 1,500 mg in sodium chloride 0.9 % 500 mL IVPB     1,500 mg 250 mL/hr over 120  Minutes Intravenous  Once 06/14/18 2134 06/15/18 0235       Objective: Vitals:   08/01/18 0442 08/01/18 1349 08/01/18 2122 08/02/18 0446  BP:  107/76 105/71 114/83  Pulse:  99 94 90  Resp:  18 16 18   Temp:  98 F (36.7 C) 98.1 F (36.7 C) 97.6 F (36.4 C)  TempSrc:  Oral Oral Oral  SpO2:  100% 98%   Weight: 84.1 kg     Height:        Intake/Output Summary (Last 24 hours) at 08/02/2018 1059 Last data filed at 08/02/2018 0453 Gross per 24 hour  Intake 2250 ml  Output -  Net 2250 ml   Filed Weights   07/29/18 0218 07/30/18 0439 08/01/18 0442  Weight: 83.6 kg 87.6 kg 84.1 kg    Examination: General exam: Appears comfortable  HEENT: PERRLA, oral  mucosa moist, no sclera icterus or thrush Respiratory system: Clear to auscultation. Respiratory effort normal. Cardiovascular system: S1 & S2 heard,  No murmurs  Gastrointestinal system: Abdomen soft, non-tender, nondistended. Normal bowel sounds   Central nervous system: Alert and oriented. No focal neurological deficits. Extremities: No cyanosis, clubbing or edema Skin: No rashes or ulcers Psychiatry:  Mood & affect appropriate.    Data Reviewed: I have personally reviewed following labs and imaging studies  CBC: Recent Labs  Lab 07/29/18 0310  WBC 7.3  NEUTROABS 5.3  HGB 9.3*  HCT 29.4*  MCV 89.6  PLT 759   Basic Metabolic Panel: Recent Labs  Lab 07/29/18 0310 08/01/18 0425  NA 137 135  K 3.6 3.8  CL 104 104  CO2 24 24  GLUCOSE 137* 94  BUN 11 9  CREATININE 0.41* 0.49  CALCIUM 9.2 9.0  MG 1.5*  --   PHOS 4.3  --    GFR: Estimated Creatinine Clearance: 111.5 mL/min (by C-G formula based on SCr of 0.49 mg/dL). Liver Function Tests: Recent Labs  Lab 07/29/18 0310  AST 30  ALT 22  ALKPHOS 157*  BILITOT 0.6  PROT 6.4*  ALBUMIN 2.3*   No results for input(s): LIPASE, AMYLASE in the last 168 hours. No results for input(s): AMMONIA in the last 168 hours. Coagulation Profile: No results for  input(s): INR, PROTIME in the last 168 hours. Cardiac Enzymes: No results for input(s): CKTOTAL, CKMB, CKMBINDEX, TROPONINI in the last 168 hours. BNP (last 3 results) No results for input(s): PROBNP in the last 8760 hours. HbA1C: No results for input(s): HGBA1C in the last 72 hours. CBG: No results for input(s): GLUCAP in the last 168 hours. Lipid Profile: No results for input(s): CHOL, HDL, LDLCALC, TRIG, CHOLHDL, LDLDIRECT in the last 72 hours. Thyroid Function Tests: No results for input(s): TSH, T4TOTAL, FREET4, T3FREE, THYROIDAB in the last 72 hours. Anemia Panel: No results for input(s): VITAMINB12, FOLATE, FERRITIN, TIBC, IRON, RETICCTPCT in the last 72 hours. Urine analysis:    Component Value Date/Time   COLORURINE YELLOW 06/26/2018 1130   APPEARANCEUR CLEAR 06/26/2018 1130   APPEARANCEUR Hazy 07/10/2013 1121   LABSPEC 1.006 06/26/2018 1130   LABSPEC 1.020 07/10/2013 1121   PHURINE 7.0 06/26/2018 1130   GLUCOSEU NEGATIVE 06/26/2018 1130   GLUCOSEU Negative 07/10/2013 1121   HGBUR NEGATIVE 06/26/2018 1130   BILIRUBINUR NEGATIVE 06/26/2018 1130   BILIRUBINUR Negative 07/10/2013 1121   KETONESUR NEGATIVE 06/26/2018 1130   PROTEINUR NEGATIVE 06/26/2018 1130   UROBILINOGEN 1.0 02/15/2015 0530   NITRITE NEGATIVE 06/26/2018 1130   LEUKOCYTESUR NEGATIVE 06/26/2018 1130   LEUKOCYTESUR Trace 07/10/2013 1121   Sepsis Labs: @LABRCNTIP (procalcitonin:4,lacticidven:4) ) No results found for this or any previous visit (from the past 240 hour(s)).       Radiology Studies: No results found.    Scheduled Meds: . chlorhexidine  15 mL Mouth/Throat BID  . enoxaparin (LOVENOX) injection  40 mg Subcutaneous Q24H  . feeding supplement (ENSURE ENLIVE)  237 mL Oral BID BM  . Gerhardt's butt cream   Topical BID  . methadone  100 mg Oral Daily  . nystatin   Topical BID  . pantoprazole  40 mg Oral BID  . prenatal multivitamin  1 tablet Oral Q1200  . sodium chloride flush  10-40  mL Intracatheter Q12H   Continuous Infusions: . sodium chloride 10 mL/hr at 07/19/18 0400  . ampicillin (OMNIPEN) IV 2 g (08/02/18 0758)  . cefTRIAXone (ROCEPHIN)  IV 2 g (08/02/18  4171)  . lactated ringers 10 mL/hr at 07/03/18 2101  . vancomycin 1,000 mg (08/02/18 0921)     LOS: 45 days      Florencia Reasons, MD [facility] Pager: [email or web address] Password [facility] 08/02/2018, 10:59 AM

## 2018-08-02 NOTE — Progress Notes (Signed)
Pt denies vaginal bleeding or leaking of fluid. Denies feeling uc's. Pt is c/o constipation and having to strain  with bowel movements. Dr. Elly Modena notified. Orders received for Colace 180m po bid.

## 2018-08-02 NOTE — Progress Notes (Signed)
Pharmacy Antibiotic Note  Kelly [name] is a 33 y.o. female admitted on 06/14/2018 with Strep and Enterococcal endocarditis.  Patient continues on  IV vancomycin, ampicillin and ceftriaxone.   Renal function stable, last vancomycin levels therapeutic, therapy to end tomorrow.  Plan: Vanc 1gm IV Q8H CTX 2gm IV Q12H Ampicillin 2gm IV Q4H Monitor renal fxn, clinical progress, no further vanc level  Height: 5' 7"  (170.2 cm) Weight: 185 lb 6.5 oz (84.1 kg) IBW/kg (Calculated) : 61.6  Temp (24hrs), Avg:97.9 F (36.6 C), Min:97.6 F (36.4 C), Max:98.1 F (36.7 C)  Recent Labs  Lab 07/29/18 0310 07/30/18 1059 08/01/18 0425  WBC 7.3  --   --   CREATININE 0.41*  --  0.49  VANCOTROUGH  --  10*  --     Estimated Creatinine Clearance: 111.5 mL/min (by C-G formula based on SCr of 0.49 mg/dL).    Allergies  Allergen Reactions  . Hydrocodone Itching  . Morphine And Related Nausea And Vomiting     Azith 2/1>>2/2 Vanc 2/1>>2/2, 2/5 >> (3/22) CTX 2/1>>(3/22) Amp 2/4>>(3/22) Tamiflu 2/3>>2/7  Dose adjustments/levels: 2/6: Levels 19/11 (5 hours apart) -  on 1gm q8h - AUC low at 389 on this dose >> dose adjusted to 2g IV q12h for estimated AUC 517 2/9: Levels 39/9 (12 hrs apart) - on Vanc 2gm IV q12h - >AUC supratherapeutic at 597 (goal 400-550) > adjusted to 1250 mg IV q8h for estimated AUC 556 - Calculated Ke 0.2185, half-life 3.2 hrs 2/12: VP/VT 30/14 (3.75 hrs apart), AUC 577 on 1253m q8 >> 1g q8 for AUC 460 2/17: VP/VT 19/13, AUC 505.6 on [id number], ke 0.1198, t1/2 5.8h - cont 2/25: VP/VT 29/13, AUC 531.8 on 1000 mg q8h, ke 0.138, t1/2 5.0h-continue 1000 mg q8h 3/3: VT 26/VT 13, AUC 535 on 1gm IV q8h, Ke 0.146, t1/2 4.7, continue. ("VP" done at 0444 is actually a trough. 13 mcg/ml. Dose due at 5am, hung at 0556.  Retimed peak for 9am, 2 hrs after dose infused) 3/10: VP 24 on 1gm q8h,  VT 10; AUC 471.7 >> no change 3/17 VT>>10 No change  2/1BCx - E.faecalis (pan-S),  Strep sanguinis 1/2 (S-Vanc) and strep mitis/oralis (S-Vanc/Clinda/CTX) 2/1Urine: E coli  (S Ancef, CTX) 2/3 Influenza PCR - positive B 2/3 Blood x 2: negative 2/7 RPR negative 2/7 HIV non-reactive 2/7 Rubella < 0.90 = non-immune 2/13 urine - negative 2/20 MRSA PCR: negative  Ailynn Gow D. DaMina MarblePharmD, BCPS, [place]/21/2020, 9:27 AM

## 2018-08-03 NOTE — Progress Notes (Signed)
FHR baseline 140 BPM, min variability, 10x10 accels, no decels, no uc's. Pt says she has been feeling her baby move. Much fetal movement during monitoring. No vaginal bleeding or leaking of fluid. Dr. Kennon Rounds notified.

## 2018-08-03 NOTE — Progress Notes (Signed)
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 Patient is  Pregnant with an approx due date 11/01/2018.    Subjective: Reports being constipated, otherwise , no acute event  Assessment & Plan:   Principal Problem:   Bacteremia due to Enterococcus and Streptococcus- bacterial endocarditis Tricuspid valve, Multifocal pneumonia and Influenza B - -Continue IV ceftriaxone, vancomycin, ampicillin - she needs IV antibiotics through 08/03/18- expect discharge on 3/23 - Cardiothoracic surgery  Dr Servando Snare consulted, currently not a surgical candidate - has had teeth removed on 2/20 ( Dr Enrique Sack) - quite stable   Per last ID note:  "x__ Please pull PIC at completion of IV antibiotics Clinic Follow Up Appt: 08/05/2018 at [place] am with Dr Tommy Medal"  Active Problems:   High-risk pregnancy  - OB RN monitoring     Opioid use disorder, severe, dependence  - continue Methadone 60 mg in AM and 40 mg in PM - Methadone has been transitioned to daily dosing to prepare her for d/c -she is followed by methadone clinic , she has an appointment with methadone clinic on 3/24, she reports has to be there at 5:30 am, she is concerned about transportation issues, will consult social worker.  Time spent in minutes: stable DVT prophylaxis: lovenox Code Status: Full code Family Communication: none at bedside Disposition Plan: will need to be in hospital for IV antibiotics, plan to discharge on 3/23,  she is to discharge to a residential facility per social worker Consultants:    ID  CT surgery  Dr Servando Snare   Dental surgery Dr Enrique Sack  OB/Gyn  Procedures:   06/16/2018 echo: 1. Tricuspid regurgitation severe by color flow Doppler. There is a large mobile tricuspid valve vegetation on the anterior leaflet. The TV vegetation measures 10 mm x 20 mm. 2. The left ventricle has normal systolic function of 35-57%. The cavity size is normal. There is no left ventricular wall thickness. Echo evidence of normal diastolic filling patterns. 3. The right ventricle is moderately enlarged in size. There is normal systolic function. Right ventricular systolic pressure is normal with an estimated pressure of 43.8 mmHg. 4. Mildly dilated left atrial size. 5. Moderately dilated right atrial size. 6. The mitral valve is myxomatous. There is mild thickening. Regurgitation is not visualized by color flow Doppler. 7. Large vegetation on the tricuspid valve. 8. Myxomatous tricuspid valve. 9. Tricuspid regurgitation severe.  10. No atrial level shunt detected by color flow Doppler. Antimicrobials:  Anti-infectives (From admission, onward)   Start     Dose/Rate Route Frequency Ordered Stop   07/28/18 1100  vancomycin (VANCOCIN) 1,000 mg in sodium chloride 0.9 % 250 mL IVPB     1,000 mg 250 mL/hr over 60 Minutes Intravenous Every 8 hours 07/28/18 1020 08/04/18 0259   07/28/18 0000  vancomycin (VANCOCIN) 1,000 mg in sodium chloride 0.9 % 250 mL IVPB  Status:  Discontinued     1,000 mg 250 mL/hr over 60 Minutes Intravenous Every 8 hours 07/27/18 2357 07/28/18 1020   06/26/18 0100  vancomycin (VANCOCIN) IVPB 1000  mg/200 mL premix  Status:  Discontinued     1,000 mg 200 mL/hr over 60 Minutes Intravenous Every 8 hours 06/25/18 1758 07/27/18 2357   06/23/18 0000  vancomycin (VANCOCIN) 1,250 mg in sodium chloride 0.9 % 250 mL IVPB  Status:  Discontinued     1,250 mg 166.7 mL/hr over 90 Minutes  Intravenous Every 8 hours 06/22/18 2332 06/25/18 1758   06/20/18 1000  vancomycin (VANCOCIN) 2,000 mg in sodium chloride 0.9 % 500 mL IVPB  Status:  Discontinued     2,000 mg 250 mL/hr over 120 Minutes Intravenous Every 12 hours 06/19/18 2221 06/22/18 2332   06/18/18 2000  vancomycin (VANCOCIN) IVPB 1000 mg/200 mL premix  Status:  Discontinued     1,000 mg 200 mL/hr over 60 Minutes Intravenous Every 8 hours 06/18/18 1054 06/19/18 2221   06/18/18 1200  vancomycin (VANCOCIN) 1,750 mg in sodium chloride 0.9 % 500 mL IVPB     1,750 mg 250 mL/hr over 120 Minutes Intravenous  Once 06/18/18 1054 06/18/18 2241   06/17/18 2200  cefTRIAXone (ROCEPHIN) 2 g in sodium chloride 0.9 % 100 mL IVPB     2 g 200 mL/hr over 30 Minutes Intravenous Every 12 hours 06/17/18 1046 08/04/18 0959   06/17/18 1200  ampicillin (OMNIPEN) 2 g in sodium chloride 0.9 % 100 mL IVPB     2 g 300 mL/hr over 20 Minutes Intravenous Every 4 hours 06/17/18 1045 08/03/18 2359   06/16/18 1300  oseltamivir (TAMIFLU) capsule 75 mg     75 mg Oral 2 times daily 06/16/18 1231 06/20/18 0919   06/15/18 0700  vancomycin (VANCOCIN) IVPB 1000 mg/200 mL premix  Status:  Discontinued     1,000 mg 200 mL/hr over 60 Minutes Intravenous Every 8 hours 06/14/18 2216 06/15/18 0711   06/15/18 0500  cefTRIAXone (ROCEPHIN) 2 g in sodium chloride 0.9 % 100 mL IVPB  Status:  Discontinued     2 g 200 mL/hr over 30 Minutes Intravenous Every 24 hours 06/15/18 0445 06/17/18 1046   06/14/18 2200  azithromycin (ZITHROMAX) 500 mg in sodium chloride 0.9 % 250 mL IVPB  Status:  Discontinued     500 mg 250 mL/hr over 60 Minutes Intravenous Every 24 hours 06/14/18 2134 06/15/18 0711   06/14/18 2200  cefTRIAXone (ROCEPHIN) 1 g in sodium chloride 0.9 % 100 mL IVPB  Status:  Discontinued     1 g 200 mL/hr over 30 Minutes Intravenous Every 24 hours 06/14/18 2134 06/15/18 0445   06/14/18 2200  vancomycin (VANCOCIN) 1,500 mg in sodium chloride 0.9 % 500 mL IVPB      1,500 mg 250 mL/hr over 120 Minutes Intravenous  Once 06/14/18 2134 06/15/18 0235       Objective: Vitals:   08/02/18 1431 08/02/18 2106 08/03/18 0434 08/03/18 0600  BP: 109/73 119/78  121/83  Pulse: 93 97  91  Resp: 17 17  17   Temp: 97.8 F (36.6 C) 98.4 F (36.9 C)  98 F (36.7 C)  TempSrc: Oral Oral  Oral  SpO2: 98% 98%  97%  Weight:   84.3 kg   Height:        Intake/Output Summary (Last 24 hours) at 08/03/2018 0832 Last data filed at 08/03/2018 0000 Gross per 24 hour  Intake 1710 ml  Output -  Net 1710 ml   Filed Weights   07/30/18 0439 08/01/18 0442 08/03/18 0434  Weight: 87.6 kg 84.1 kg 84.3 kg    Examination: General exam: Appears comfortable  HEENT: PERRLA, oral mucosa moist, no sclera icterus or thrush Respiratory system: Clear to auscultation. Respiratory effort normal. Cardiovascular system: S1 & S2 heard,  No murmurs  Gastrointestinal system: Abdomen soft, non-tender, nondistended. Normal bowel sounds   Central nervous system: Alert and oriented. No focal neurological deficits. Extremities: No cyanosis, clubbing or edema Skin: No rashes or ulcers Psychiatry:  Mood & affect appropriate.    Data Reviewed: I have personally reviewed following labs and imaging studies  CBC: Recent Labs  Lab 07/29/18 0310  WBC 7.3  NEUTROABS 5.3  HGB 9.3*  HCT 29.4*  MCV 89.6  PLT 846   Basic Metabolic Panel: Recent Labs  Lab 07/29/18 0310 08/01/18 0425  NA 137 135  K 3.6 3.8  CL 104 104  CO2 24 24  GLUCOSE 137* 94  BUN 11 9  CREATININE 0.41* 0.49  CALCIUM 9.2 9.0  MG 1.5*  --   PHOS 4.3  --    GFR: Estimated Creatinine Clearance: 111.6 mL/min (by C-G formula based on SCr of 0.49 mg/dL). Liver Function Tests: Recent Labs  Lab 07/29/18 0310  AST 30  ALT 22  ALKPHOS 157*  BILITOT 0.6  PROT 6.4*  ALBUMIN 2.3*   No results for input(s): LIPASE, AMYLASE in the last 168 hours. No results for input(s): AMMONIA in the last 168 hours. Coagulation  Profile: No results for input(s): INR, PROTIME in the last 168 hours. Cardiac Enzymes: No results for input(s): CKTOTAL, CKMB, CKMBINDEX, TROPONINI in the last 168 hours. BNP (last 3 results) No results for input(s): PROBNP in the last 8760 hours. HbA1C: No results for input(s): HGBA1C in the last 72 hours. CBG: No results for input(s): GLUCAP in the last 168 hours. Lipid Profile: No results for input(s): CHOL, HDL, LDLCALC, TRIG, CHOLHDL, LDLDIRECT in the last 72 hours. Thyroid Function Tests: No results for input(s): TSH, T4TOTAL, FREET4, T3FREE, THYROIDAB in the last 72 hours. Anemia Panel: No results for input(s): VITAMINB12, FOLATE, FERRITIN, TIBC, IRON, RETICCTPCT in the last 72 hours. Urine analysis:    Component Value Date/Time   COLORURINE YELLOW 06/26/2018 1130   APPEARANCEUR CLEAR 06/26/2018 1130   APPEARANCEUR Hazy 07/10/2013 1121   LABSPEC 1.006 06/26/2018 1130   LABSPEC 1.020 07/10/2013 1121   PHURINE 7.0 06/26/2018 1130   GLUCOSEU NEGATIVE 06/26/2018 1130   GLUCOSEU Negative 07/10/2013 1121   HGBUR NEGATIVE 06/26/2018 1130   BILIRUBINUR NEGATIVE 06/26/2018 1130   BILIRUBINUR Negative 07/10/2013 1121   KETONESUR NEGATIVE 06/26/2018 1130   PROTEINUR NEGATIVE 06/26/2018 1130   UROBILINOGEN 1.0 02/15/2015 0530   NITRITE NEGATIVE 06/26/2018 1130   LEUKOCYTESUR NEGATIVE 06/26/2018 1130   LEUKOCYTESUR Trace 07/10/2013 1121   Sepsis Labs: @LABRCNTIP (procalcitonin:4,lacticidven:4) ) No results found for this or any previous visit (from the past 240 hour(s)).       Radiology Studies: No results found.    Scheduled Meds: . chlorhexidine  15 mL Mouth/Throat BID  . docusate sodium  100 mg Oral BID  . enoxaparin (LOVENOX) injection  40 mg Subcutaneous Q24H  . feeding supplement (ENSURE ENLIVE)  237 mL Oral BID BM  . Gerhardt's butt cream   Topical BID  . methadone  100 mg Oral Daily  . nystatin   Topical BID  . pantoprazole  40 mg Oral BID  . prenatal  multivitamin  1 tablet Oral Q1200  . sodium chloride flush  10-40 mL Intracatheter Q12H   Continuous Infusions: . sodium chloride 10 mL/hr at 07/19/18 0400  . ampicillin (OMNIPEN) IV 2  g (08/03/18 0805)  . cefTRIAXone (ROCEPHIN)  IV 2 g (08/02/18 2218)  . lactated ringers 10 mL/hr at 07/03/18 2101  . vancomycin 1,000 mg (08/03/18 0311)     LOS: 80 days      Florencia Reasons, MD PhD [facility] Pager: [email or web address] Password [facility] 08/03/2018, 8:32 AM

## 2018-08-04 ENCOUNTER — Encounter (HOSPITAL_COMMUNITY): Payer: Self-pay

## 2018-08-04 MED ORDER — DOCUSATE SODIUM 100 MG PO CAPS: 100.0000 mg | ORAL_CAPSULE | Freq: Two times a day (BID) | ORAL | 0 refills | Status: DC

## 2018-08-04 MED ORDER — METHADONE HCL 10 MG PO TABS: 100.0000 mg | ORAL_TABLET | Freq: Every day | ORAL | 0 refills | Status: DC

## 2018-08-04 MED ORDER — ENSURE ENLIVE PO LIQD: 237.0000 mL | Freq: Two times a day (BID) | ORAL | 12 refills | Status: DC

## 2018-08-04 MED ORDER — IBUPROFEN 400 MG PO TABS: 400.0000 mg | ORAL_TABLET | Freq: Four times a day (QID) | ORAL | 0 refills | Status: DC | PRN

## 2018-08-04 MED ORDER — PRENATAL MULTIVITAMIN CH: 1.0000 | ORAL_TABLET | Freq: Every day | ORAL | Status: DC

## 2018-08-04 MED ORDER — NYSTATIN 100000 UNIT/GM EX POWD: Freq: Two times a day (BID) | CUTANEOUS | 0 refills | Status: DC

## 2018-08-04 NOTE — Discharge Summary (Signed)
Discharge Summary  Kelly [name] BVQ:[id number] DOB: June 16, 1985  [facility]  Admit date: 06/14/2018 Discharge date: 08/04/2018  Time spent: 38mns, more than 50% time spent on coordination of care.  Recommendations for Outpatient Follow-up:  1. F/u with pcp within a week for hospital discharge follow up, repeat cbc/bmp at follow up 2. F/u with infectious disease 3. F/u with OB 4. F/u methadone clinic ( [facility]) 5. F/u with thoracic surgery as needed 6. 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   Preexisting hypertension complicating pregnancy, antepartum 06/29/2018   High-risk pregnancy in second trimester 06/27/2018   Rubella non-immune status, antepartum 06/23/2018   Influenza B 06/17/2018   Multifocal pneumonia 06/14/2018   Hyponatremia 06/14/2018   Elevated transaminase level 06/14/2018   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   History of bacterial endocarditis 06/14/2018   Severe tricuspid regurgitation by prior echocardiogram 06/14/2018   IVDU (intravenous drug user)    Sepsis due to pneumonia ([facility] 12/02/2017   Opioid use disorder, severe, dependence ([facility] 08/25/2013    Resolved Hospital Problems   Diagnosis Date Noted Date Resolved   Chronic apical periodontitis 07/03/2018 07/03/2018   Acute pulpitis 07/03/2018 07/03/2018    Discharge Condition: stable  Diet recommendation: regular diet  Filed Weights   08/01/18 0442 08/03/18 0434 08/04/18 0500  Weight: 84.1 kg 84.3 kg 85  kg    History of present illness: (per admitting MD Dr OMyna Hidalgo [facility]  Patient coming from: Home   Chief Complaint: Low back pain, cough, malaise   HPI: CKARYSA HEFTis a 33y.o. 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  Patient reports that about back pain began to worsen about a week ago and the symptoms are similar to when she was experiencing a epidural abscess in July.  She is also had a productive cough, exertional dyspnea, and general malaise.  She reports a period of abstinence from IV drug abuse before she relapsed in December and has continued to use since that time.  She knows that she is pregnant and is following with OB/GYN.  She would like to get back into a rehabilitation program for her drug dependence.  ED Course: Upon arrival to the ED, patient is found to be afebrile, saturating 90% on room air, tachypneic in the low 30s, tachycardic to 120, and with blood pressure 92/57.  EKG features sinus tachycardia with rate 108.  Chest x-ray is concerning for multifocal pneumonia.  MRI lumbar spine is stable from September 2019.  Obstetric ultrasound reveals single living intrauterine gestation with EGA [redacted] weeks and 1 day.  Chemistry panel is notable for sodium of 127, alkaline phosphatase 202, albumin 2.3, and AST 269.  CBC features a leukocytosis to  15,900, mild thrombocytosis, and stable normocytic anemia.  Lactic acid was slightly elevated.  Blood cultures were collected, 500 cc normal saline was administered, and the patient was treated with vancomycin, Rocephin, and azithromycin in the ED.  OB/GYN was  consulted by the ED physician.  Tachycardia and blood pressure improved with IV fluids, there is no acute respiratory distress, and the patient will be observed for further evaluation and management.   Hospital Course:  Principal Problem:   Bacteremia due to Enterococcus Active Problems:   Opioid use disorder, severe, dependence (HCC)   Sepsis due to pneumonia ([place])   IVDU (intravenous drug user)   Multifocal pneumonia   Hyponatremia   Elevated transaminase level   Degenerative arthritis of lumbar spine   History of bacterial endocarditis   Severe tricuspid regurgitation by prior echocardiogram   Influenza B   Rubella non-immune status, antepartum   High-risk pregnancy in second trimester   Preexisting hypertension complicating pregnancy, antepartum   Dental caries   Phobia of dental procedure   Endocarditis   Infection due to Streptococcus mitis group   [redacted] weeks gestation of pregnancy   Abdominal distention   Acute bilateral low back pain   Hematoma   PICC (peripherally inserted central catheter) flush   Bacteremia due to Enterococcus and Streptococcus- bacterial endocarditis Tricuspid valve, Multifocal pneumonia and Influenza B - -Continue IV ceftriaxone, vancomycin, ampicillin - finished IV antibiotics through 08/03/18- picc line pulled prior to discharge, she is  discharged on 3/23 - Cardiothoracic surgery  Dr Servando Snare consulted, currently not a surgical candidate - has had teeth removed on 2/20 ( Dr Enrique Sack) - she is to follow up with Dr Tommy Medal    Per last ID note:  "x__ Please pull PIC at completion of IV antibiotics Clinic Follow Up Appt: 08/05/2018 at [place] am with Dr Tommy Medal"  Active Problems:   High-risk pregnancy  - OB RN monitoring     Opioid use disorder, severe, dependence  - has been on Methadone 120m daily , last dose taken on 3/23 at 10am, next dose on 3/24 10 am - -she is followed by methadone clinic , she has an appointment with methadone clinic  on 3/24,    Time spent in minutes: stable DVT prophylaxis while in the hospital: lovenox Code Status: Full code Family Communication: none at bedside Disposition Plan:  she is to discharge to a residential facility per social worker Consultants:    ID  CT surgery  Dr GServando Snare  Dental surgery Dr KEnrique Sack OB/Gyn  Procedures:   06/16/2018 echo: 1. Tricuspid regurgitation severe by color flow Doppler. There is a large mobile tricuspid valve vegetation on the anterior leaflet. The TV vegetation measures 10 mm x 20 mm. 2. The left ventricle has normal systolic function of 662-83% The cavity size is normal. There is no left ventricular wall thickness. Echo evidence of normal diastolic filling patterns. 3. The right ventricle is moderately enlarged in size. There is normal systolic function. Right ventricular systolic pressure is normal with an estimated pressure of 43.8 mmHg. 4. Mildly dilated left atrial size. 5. Moderately dilated right atrial size. 6. The mitral valve is myxomatous. There is mild thickening. Regurgitation is not visualized by color flow Doppler. 7. Large vegetation on the tricuspid valve. 8. Myxomatous tricuspid valve. 9. Tricuspid regurgitation severe.  10. No atrial level shunt detected by color flow Doppler. Antimicrobials:  ceftriaxone, vancomycin, ampicillin   Procedures:  picc line placement and removal  Extraction of tooth #'  s 81 and 31 with alveoloplasty (Bilateral ) on 2/20 by Dr Enrique Sack   Discharge Exam: BP 98/61    Pulse 88    Temp 98.2 F (36.8 C) (Oral)    Resp 18    Ht 5' 7"  (1.702 m)    Wt 85 kg    LMP 12/30/2017 (Within Weeks)    SpO2 97%    BMI 29.35 kg/m   General: NAD Cardiovascular: RRR Respiratory: CTABL  Discharge Instructions You were cared for by a hospitalist during your hospital stay. If you have any questions about your discharge medications or the care you received while you were in the hospital after you are  discharged, you can call the unit and asked to speak with the hospitalist on call if the hospitalist that took care of you is not available. Once you are discharged, your primary care physician will handle any further medical issues. Please note that NO REFILLS for any discharge medications will be authorized once you are discharged, as it is imperative that you return to your primary care physician (or establish a relationship with a primary care physician if you do not have one) for your aftercare needs so that they can reassess your need for medications and monitor your lab values.  Discharge Instructions    Diet general   Complete by:  As directed    Increase activity slowly   Complete by:  As directed      Allergies as of 08/04/2018      Reactions   Hydrocodone Itching   Morphine And Related Nausea And Vomiting      Medication List    STOP taking these medications   methadone 10 MG/5ML solution Commonly known as:  DOLOPHINE Replaced by:  methadone 10 MG tablet   nicotine polacrilex 2 MG gum Commonly known as:  NICORETTE   polyethylene glycol packet Commonly known as:  MIRALAX / GLYCOLAX   promethazine 25 MG tablet Commonly known as:  PHENERGAN   senna-docusate 8.6-50 MG tablet Commonly known as:  Senokot-S     TAKE these medications   acetaminophen 500 MG tablet Commonly known as:  TYLENOL Take 2 tablets (1,000 mg total) by mouth every 8 (eight) hours.   docusate sodium 100 MG capsule Commonly known as:  COLACE Take 1 capsule (100 mg total) by mouth 2 (two) times daily.   feeding supplement (ENSURE ENLIVE) Liqd Take 237 mLs by mouth 2 (two) times daily between meals.   ferrous sulfate 325 (65 FE) MG tablet Take 1 tablet (325 mg total) by mouth daily with breakfast.   ibuprofen 400 MG tablet Commonly known as:  ADVIL,MOTRIN Take 1 tablet (400 mg total) by mouth every 6 (six) hours as needed for moderate pain.   magnesium oxide 400 (241.3 Mg) MG  tablet Commonly known as:  MAG-OX Take 1 tablet (400 mg total) by mouth 2 (two) times daily.   methadone 10 MG tablet Commonly known as:  DOLOPHINE Take 10 tablets (100 mg total) by mouth daily. Start taking on:  August 05, 2018 Replaces:  methadone 10 MG/5ML solution   nystatin powder Commonly known as:  MYCOSTATIN/NYSTOP Apply topically 2 (two) times daily.   pantoprazole 40 MG tablet Commonly known as:  PROTONIX Take 1 tablet (40 mg total) by mouth daily.   prenatal multivitamin Tabs tablet Take 1 tablet by mouth daily at 12 noon.      Allergies  Allergen Reactions   Hydrocodone Itching   Morphine And Related Nausea And  Vomiting   Follow-up Information    [facility] Follow up.   Specialty:  Family Medicine Contact information: [address] [place] [address] [phone]        Grace Isaac, MD Follow up.   Specialty:  Cardiothoracic Surgery Why:  endocraditis  Contact information: [place] [address] [address] [facility] [place] Follow up.   Specialty:  Obstetrics and Gynecology Why:  The office will call you Monday or Tuesday to make your first prenatal appointment. If you do not hear from them, please call us. The location of the office may change, please be sure to ask where the office is located for your appointment.  Contact information: [place], Lavell Islam, MD Follow up on 08/05/2018.   Specialty:  Infectious Diseases Why:  10;15 am Contact information: [address] [address] [phone]        Lenn Cal, DDS Follow up.   Specialty:  Dentistry Why:  for questions related to your teeth  Contact information: [place] [address] [phone]        [facility]. Go on  08/29/2018.   Specialty:  Maternal and Fetal Medicine Why:  Your appointment is at 1:15 pm. Please call the office prior to your appointment to confirm the location as this office may move.  Contact information: [address] [phone] [place] [phone]           The results of significant diagnostics from this hospitalization (including imaging, microbiology, ancillary and laboratory) are listed below for reference.    Significant Diagnostic Studies: US Abdomen Limited  Result Date: 07/23/2018 CLINICAL DATA:  Palpable abnormality seen in right lower quadrant. Reportedly former injection site. EXAM: ULTRASOUND ABDOMEN LIMITED COMPARISON:  None. FINDINGS: Limited sonographic evaluation in the right lower quadrant of the abdomen demonstrates predominantly cystic but complex abnormality measuring 2.9 x 1.7 x 1.6 cm. This may represent hematoma or seroma, but abscess can not be excluded. Clinical correlation is recommended. IMPRESSION: 2.9 x 1.7 x 1.6 cm complex but predominantly cystic abnormality seen in area of palpable concern in right lower quadrant of abdomen. This may represent hematoma or seroma, but clinical correlation is recommended to rule out abscess. Electronically Signed   By: Marijo Conception, M.D.   On: 07/23/2018 15:57   Dg Chest Port 1 View  Result Date: 07/26/2018 CLINICAL DATA:  PICC line placement. Patient is pregnant. EXAM: PORTABLE CHEST 1 VIEW COMPARISON:  06/14/2018 FINDINGS: A right PICC line has been placed with tip over the upper SVC region. No pneumothorax. Linear infiltrates in the lung bases with peribronchial thickening suggesting pneumonia and bronchitis. Cardiac enlargement without vascular congestion. Postoperative changes in the thoracic spine. IMPRESSION: Right PICC line placed with tip over the upper SVC region. No pneumothorax. Linear infiltrates and peribronchial thickening in the lung bases suggesting pneumonia and  bronchitis. Electronically Signed   By: Lucienne Capers M.D.   On: 07/26/2018 02:26   [place] [facility] Ob Follow Up  Result Date: 07/21/2018 ----------------------------------------------------------------------  OBSTETRICS REPORT                       (Signed Final 07/21/2018 01:42 pm) ---------------------------------------------------------------------- Patient Info  ID #:  [id number]                          D.O.B.:  05/25/1985 (32 yrs)  Name:       AWANDA WILCOCK              Visit Date: 07/21/2018 09:25 am ---------------------------------------------------------------------- Performed By  Performed By:     Novella Rob        Ref. Address:     [address]                                                             [place], [place]  Attending:        Tama High MD        [facility]  Referred By:      Osborne Oman MD ---------------------------------------------------------------------- Orders   #  Description                          Code         Ordered By   1  [place] [facility] OB FOLLOW UP                  44628.63     Verita Schneiders  ----------------------------------------------------------------------   #  Order #                    Accession #                 Episode #   1  [id number]                  [id number]                  [id number]  ---------------------------------------------------------------------- Indications   Encounter for other antenatal screening        Z36.2  follow-up   Drug use complicating pregnancy, second        O99.322   trimester (IV drug use)   Hypertension - Chronic/Pre-existing            O10.019   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   O99.412   in second trimester (bacterial endocarditis)   Other  mental disorder complicating             O99.340   pregnancy, second trimester (bipolar, major   depressive disorder)   Anemia during pregnancy in second trimester    O99.012   [redacted] weeks gestation of pregnancy                Z3A.25  ---------------------------------------------------------------------- Vital Signs  Weight (lb): 182                               Height:        5'7"  BMI:         28.5 ---------------------------------------------------------------------- Fetal Evaluation  Num Of Fetuses:         1  Fetal Heart Rate(bpm):  133  Cardiac Activity:       Observed  Presentation:           Transverse, head to maternal left  Placenta:               Anterior  P. Cord Insertion:      Previously Visualized  Amniotic Fluid  AFI FV:      Within normal limits                              Largest Pocket(cm)                              5.2 ---------------------------------------------------------------------- Biometry  BPD:      57.3  mm     G. Age:  23w 4d          3  %    CI:        68.08   %    70 - 86                                                          FL/HC:      20.4   %    18.7 - 20.3  HC:      222.1  mm     G. Age:  24w 1d          6  %    HC/AC:      1.03        1.04 - 1.22  AC:      214.7  mm     G. Age:  26w 0d         62  %    FL/BPD:     79.1   %    71 - 87  FL:       45.3  mm     G. Age:  25w 0d         27  %    FL/AC:      21.1   %  20 - 24  HUM:        43  mm     G. Age:  25w 5d         55  %  Est. FW:     794  gm    1 lb 12 oz      54  % ---------------------------------------------------------------------- OB History  Gravidity:    6         Term:   2        Prem:   0        SAB:   3  TOP:          0       Ectopic:  0        Living: 2 ---------------------------------------------------------------------- Gestational Age  LMP:           30w 3d        Date:  12/20/17                 EDD:   09/26/18  U/S Today:     24w 5d                                        EDD:   11/05/18  Best:           25w 2d     Det. ByLoman Chroman         EDD:   11/01/18                                      (04/01/18) ---------------------------------------------------------------------- Anatomy  Cranium:               Appears normal         Aortic Arch:            Not well visualized  Cavum:                 Appears normal         Ductal Arch:            Not well visualized  Ventricles:            Previously seen        Diaphragm:              Appears normal  Choroid Plexus:        Previously seen        Stomach:                Appears normal, left                                                                        sided  Cerebellum:            Previously seen        Abdomen:                Appears normal  Posterior Fossa:       Previously seen        Abdominal  Wall:         Previously seen  Nuchal Fold:           Not applicable (>25    Cord Vessels:           Previously seen                         wks GA)  Face:                  Orbits ps; profile not Kidneys:                Appear normal                         well visualized  Lips:                  Appears normal         Bladder:                Appears normal  Thoracic:              Appears normal         Spine:                  Not well visualized  Heart:                 Previously seen        Upper Extremities:      Previously seen  RVOT:                  Previously seen        Lower Extremities:      Previously seen  LVOT:                  Appears normal  Other:  Fetus appears to be female. Technically difficult due to maternal          habitus and fetal position. ---------------------------------------------------------------------- Cervix Uterus Adnexa  Cervix  Not visualized (advanced GA >24wks) ---------------------------------------------------------------------- Impression  Patient is admitted for treatment of bacterial endocarditis.  She is on IV antibiotics.  Amniotic fluid is normal and good fetal activity is seen. Fetal  growth is appropriate for  gestational age. Nose/lips and  LVOT are normal.  This study was remotely read. ---------------------------------------------------------------------- Recommendations  -Follow-up scan in 4 weeks for fetal growth and completion of  anatomy if possible. ----------------------------------------------------------------------                  Tama High, MD Electronically Signed Final Report   07/21/2018 01:42 pm ----------------------------------------------------------------------   Microbiology: No results found for this or any previous visit (from the past 240 hour(s)).   Labs: Basic Metabolic Panel: Recent Labs  Lab 07/29/18 0310 08/01/18 0425  NA 137 135  K 3.6 3.8  CL 104 104  CO2 24 24  GLUCOSE 137* 94  BUN 11 9  CREATININE 0.41* 0.49  CALCIUM 9.2 9.0  MG 1.5*  --   PHOS 4.3  --    Liver Function Tests: Recent Labs  Lab 07/29/18 0310  AST 30  ALT 22  ALKPHOS 157*  BILITOT 0.6  PROT 6.4*  ALBUMIN 2.3*   No results for input(s): LIPASE, AMYLASE in the last 168 hours. No results for input(s): AMMONIA in the last 168 hours. CBC: Recent Labs  Lab 07/29/18 0310  WBC 7.3  NEUTROABS 5.3  HGB 9.3*  HCT 29.4*  MCV 89.6  PLT 385   Cardiac Enzymes: No results for input(s): CKTOTAL, CKMB, CKMBINDEX, TROPONINI in the last 168 hours. BNP: BNP (last 3 results) No results for input(s): BNP in the last 8760 hours.  ProBNP (last 3 results) No results for input(s): PROBNP in the last 8760 hours.  CBG: No results for input(s): GLUCAP in the last 168 hours.     Signed:  Florencia Reasons MD, PhD  [facility] 08/04/2018, 12:23 PM

## 2018-08-04 NOTE — TOC Transition Note (Signed)
Transition of Care [facility]) - CM/SW Discharge Note   Patient Details  Name: Kelly [name] MRN: [id number] Date of Birth: [birth year]  Transition of [name]) CM/SW Contact:  Alexander [place], [place] Phone Number: 08/04/2018, 2:17 PM   Clinical Narrative:    Pt given two letters from MD for [facility]. Spoke with [facility] at the [facility]- they will pick her up at 4pm.  Provided support to pt, she is understandably nervous but feels ready for this next step.   Final next level of care: Home/Self Care Barriers to Discharge: Transportation   Patient Goals and CMS Choice Patient states their goals for this hospitalization and ongoing recovery are:: to have support and stay sober   Choice offered to / list presented to : Patient  Discharge Placement              Patient chooses bed at: Other - please specify in the comment section below:([facility]) Patient to be transferred to facility by: transport private Name of family member notified: pt A&O will inform family Patient and family notified of of transfer: 08/04/18  Discharge Plan and Services   Discharge Planning Services: CM Consult                      Social Determinants of Health ([place]) Interventions     Readmission Risk Interventions Readmission Risk Prevention Plan 07/30/2018  Transportation Screening Complete  Medication Review [employer]) Complete  PCP or Specialist appointment within 3-5 days of discharge Complete  HRI or [place] Not Complete  HRI or Home Care Consult Pt Refusal Comments N/A  SW Recovery Care/Counseling Consult Complete  Palliative Care Screening Not Complete  Comments N/A  [place] Not Complete  SNF Comments N/A  Some recent data might be hidden

## 2018-08-04 NOTE — Progress Notes (Signed)
Daily FHR NST completed. Pt denies vaginal bleeding, LOF, or UC's and reports good fetal movement.  Pt is for discharge today.

## 2018-08-04 NOTE — Progress Notes (Signed)
Pt. Stated she may be unable to be discharged today due to lack of transportation to [place] upon discharge. SW [employer] contacted and stated she would follow up with the pt. Will continue to monitor.

## 2018-08-04 NOTE — TOC Transition Note (Signed)
Transition of Care [name]) - CM/SW Discharge Note   Patient Details  Name: Kelly [name] MRN: [id number] Date of Birth: [birth year]  Transition of Care [facility]) CM/SW Contact:  Alexander Mt, [place] Phone Number: 08/04/2018, 12:16 PM   Clinical Narrative:    Spoke with pt at bedside, introduced self, role, reason for visit. Pt familiar with this Probation officer from previous admission. Pt plan to d/c to Room at the [facility]. She is experiencing some anxiety around transportation to her appointment for intake at [facility]. Pt has intake at 5:30 am tomorrow which is before Room at the [place] transport time.   CSW spoke with [name] at Room at the [place], expressed concern on behalf of pt that she needs to attend the intake appointment in order to ensure spot at treatment for methadone. [name] states she will talk to her president but most likely she will arrange transport for pt to get to [facility] via [facility] tomorrow morning. Pt aware- states she will advocate and make sure she is picked up. Pt also requested a letter for [facility] that states her dosage and when was the last/next dosages.   CSW requested this letter from MD, as well as discharge information. Will update Jennifer at Room at the [name] when pt formally discharged.    Final next level of care: Home/Self Care Barriers to Discharge: Transportation   Patient Goals and CMS Choice Patient states their goals for this hospitalization and ongoing recovery are:: to have support and stay sober   Choice offered to / list presented to : Patient  Discharge Placement              Patient chooses bed at: Other - please specify in the comment section below:(Room at the [name]) Patient to be transferred to facility by: transport private Name of family member notified: pt A&O will inform family Patient and family notified of of transfer: 08/04/18  Discharge Plan and Services   Discharge Planning Services: CM Consult                      Social  Determinants of Health ([place]) Interventions     Readmission Risk Interventions Readmission Risk Prevention Plan 07/30/2018  Transportation Screening Complete  Medication Review Press photographer) Complete  PCP or Specialist appointment within 3-5 days of discharge Complete  HRI or [place] Not Complete  HRI or Home Care Consult Pt Refusal Comments N/A  SW Recovery Care/Counseling Consult Complete  Palliative Care Screening Not Complete  Comments N/A  [place] Not Complete  SNF Comments N/A  Some recent data might be hidden

## 2018-08-05 ENCOUNTER — Inpatient Hospital Stay: Payer: Medicaid Other | Admitting: Infectious Disease

## 2018-08-07 ENCOUNTER — Ambulatory Visit (INDEPENDENT_AMBULATORY_CARE_PROVIDER_SITE_OTHER): Payer: Medicaid Other | Admitting: Obstetrics & Gynecology

## 2018-08-07 ENCOUNTER — Inpatient Hospital Stay (HOSPITAL_COMMUNITY)
Admission: AD | Admit: 2018-08-07 | Discharge: 2018-08-07 | Disposition: A | Discharge status: Home / Self Care | Payer: Medicaid Other | Attending: Obstetrics & Gynecology | Admitting: Obstetrics & Gynecology

## 2018-08-07 ENCOUNTER — Other Ambulatory Visit (HOSPITAL_COMMUNITY)
Admission: RE | Admit: 2018-08-07 | Discharge: 2018-08-07 | Disposition: A | Discharge status: Home / Self Care | Payer: Medicaid Other | Source: Ambulatory Visit | Attending: Obstetrics & Gynecology | Admitting: Obstetrics & Gynecology

## 2018-08-07 ENCOUNTER — Encounter (HOSPITAL_COMMUNITY): Payer: Self-pay | Admitting: Obstetrics & Gynecology

## 2018-08-07 ENCOUNTER — Other Ambulatory Visit: Payer: Self-pay

## 2018-08-07 ENCOUNTER — Encounter: Payer: Self-pay | Admitting: Obstetrics & Gynecology

## 2018-08-07 ENCOUNTER — Telehealth: Payer: Self-pay | Admitting: Cardiovascular Disease

## 2018-08-07 VITALS — BP 121/79 | HR 112 | Wt 182.8 lb

## 2018-08-07 DIAGNOSIS — Z3A26 26 weeks gestation of pregnancy: Secondary | ICD-10-CM

## 2018-08-07 DIAGNOSIS — O4692 Antepartum hemorrhage, unspecified, second trimester: Secondary | ICD-10-CM | POA: Insufficient documentation

## 2018-08-07 DIAGNOSIS — O26892 Other specified pregnancy related conditions, second trimester: Secondary | ICD-10-CM

## 2018-08-07 DIAGNOSIS — O162 Unspecified maternal hypertension, second trimester: Secondary | ICD-10-CM | POA: Insufficient documentation

## 2018-08-07 DIAGNOSIS — Z9049 Acquired absence of other specified parts of digestive tract: Secondary | ICD-10-CM | POA: Diagnosis not present

## 2018-08-07 DIAGNOSIS — Z98891 History of uterine scar from previous surgery: Secondary | ICD-10-CM

## 2018-08-07 DIAGNOSIS — O99332 Smoking (tobacco) complicating pregnancy, second trimester: Secondary | ICD-10-CM | POA: Insufficient documentation

## 2018-08-07 DIAGNOSIS — F1911 Other psychoactive substance abuse, in remission: Secondary | ICD-10-CM | POA: Diagnosis not present

## 2018-08-07 DIAGNOSIS — Z3A27 27 weeks gestation of pregnancy: Secondary | ICD-10-CM

## 2018-08-07 DIAGNOSIS — Z8249 Family history of ischemic heart disease and other diseases of the circulatory system: Secondary | ICD-10-CM | POA: Insufficient documentation

## 2018-08-07 DIAGNOSIS — F319 Bipolar disorder, unspecified: Secondary | ICD-10-CM | POA: Insufficient documentation

## 2018-08-07 DIAGNOSIS — O10912 Unspecified pre-existing hypertension complicating pregnancy, second trimester: Secondary | ICD-10-CM | POA: Diagnosis not present

## 2018-08-07 DIAGNOSIS — Z23 Encounter for immunization: Secondary | ICD-10-CM | POA: Diagnosis not present

## 2018-08-07 DIAGNOSIS — O09893 Supervision of other high risk pregnancies, third trimester: Secondary | ICD-10-CM | POA: Diagnosis not present

## 2018-08-07 DIAGNOSIS — O34211 Maternal care for low transverse scar from previous cesarean delivery: Secondary | ICD-10-CM

## 2018-08-07 DIAGNOSIS — R531 Weakness: Secondary | ICD-10-CM

## 2018-08-07 DIAGNOSIS — O99342 Other mental disorders complicating pregnancy, second trimester: Secondary | ICD-10-CM | POA: Diagnosis not present

## 2018-08-07 DIAGNOSIS — O4690 Antepartum hemorrhage, unspecified, unspecified trimester: Secondary | ICD-10-CM

## 2018-08-07 DIAGNOSIS — R32 Unspecified urinary incontinence: Secondary | ICD-10-CM

## 2018-08-07 DIAGNOSIS — Z885 Allergy status to narcotic agent status: Secondary | ICD-10-CM | POA: Insufficient documentation

## 2018-08-07 DIAGNOSIS — F17210 Nicotine dependence, cigarettes, uncomplicated: Secondary | ICD-10-CM | POA: Insufficient documentation

## 2018-08-07 DIAGNOSIS — Z79899 Other long term (current) drug therapy: Secondary | ICD-10-CM | POA: Insufficient documentation

## 2018-08-07 DIAGNOSIS — Z3481 Encounter for supervision of other normal pregnancy, first trimester: Secondary | ICD-10-CM | POA: Diagnosis not present

## 2018-08-07 DIAGNOSIS — O34219 Maternal care for unspecified type scar from previous cesarean delivery: Secondary | ICD-10-CM | POA: Diagnosis not present

## 2018-08-07 DIAGNOSIS — O10919 Unspecified pre-existing hypertension complicating pregnancy, unspecified trimester: Secondary | ICD-10-CM

## 2018-08-07 DIAGNOSIS — Z833 Family history of diabetes mellitus: Secondary | ICD-10-CM | POA: Insufficient documentation

## 2018-08-07 NOTE — Progress Notes (Signed)
  Subjective:    Kelly [name] is being seen today for her first obstetrical visit.  This is not a planned pregnancy. She is at 33w5dgestation. Her obstetrical history is significant for h/o C/S, previous heroin user. Relationship with FOB: significant other, not living together. Patient does intend to breast feed. Pregnancy history fully reviewed.  Patient reports continued weakness. She had PT scheduled but didn't keep the appt. She has been having eneuresis since her spinal surgery for abscess 7/19.   Review of Systems:   Review of Systems Unemployed currently  Objective:     LMP 12/30/2017 (Within Weeks)  Physical Exam  Exam Heart-  Lungs- CTAB Abd- benign, gravid, FH- Vagina, vulva, cervix-   Assessment:    Pregnancy: [id number] Active Problem List   Diagnosis Date Noted  . [redacted] weeks gestation of pregnancy   . Abdominal distention   . Acute bilateral low back pain   . Hematoma   . PICC (peripherally inserted central catheter) flush   . Infection due to Streptococcus mitis group 07/20/2018  . History of VBAC 07/16/2018  . Endocarditis 07/12/2018  . Dental caries 07/03/2018  . 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 Preexisting hypertension complicating pregnancy, antepartum 06/29/2018  . High-risk pregnancy in second trimester 06/27/2018  . Rubella non-immune status, antepartum 06/23/2018  . Influenza B 06/17/2018  . Bacteremia due to Enterococcus 06/15/2018  . Multifocal pneumonia 06/14/2018  . Hyponatremia 06/14/2018  . Elevated transaminase level 06/14/2018  . 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 History of bacterial endocarditis 06/14/2018  . Severe tricuspid regurgitation by prior echocardiogram 06/14/2018  . 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 Difficult intravenous access   . IVDU (intravenous drug user)   . AKI (acute kidney injury) ([place]   . Sepsis due to pneumonia ([place] 12/02/2017  .  Bipolar disorder ([place] 07/31/2016  . Major depressive disorder, recurrent episode with anxious distress ([place] 07/29/2016  . Normal labor 07/26/2016  . Foreign body granuloma of soft tissue, NEC, right upper arm   . Pyelonephritis 05/28/2016  . Polysubstance abuse (HC[place] 05/25/2016  . No prenatal care in current pregnancy 05/25/2016  . Chronic hypertension affecting pregnancy 05/25/2016  . History of cesarean delivery affecting pregnancy 05/25/2016  . Community acquired pneumonia 05/25/2016  . Pyelonephritis affecting pregnancy 05/24/2016  . Palliative care encounter   . Abscess of right breast 07/13/2014  . Opioid use disorder, severe, dependence ([place] 08/25/2013  . Unspecified episodic mood disorder 08/25/2013  . ADHD (attention deficit hyperactivity disorder) 08/25/2013  . Cocaine abuse ([place] 08/25/2013  . Thoracic back pain 01/29/2013  . CROHN'S DISEASE 05/03/2005  . COLONIC POLYPS 02/28/2003       Plan:     Initial labs drawn. Prenatal vitamins. Problem list reviewed and updated. Role of ultrasound in pregnancy discussed; fetal survey: ordered and scheduled She is requesting PT due to weakness due to endocarditis. I ordered PT but also rec'd that she do the exercises at home that she already learned in previous PT.  MEmily Filbert3/26/2020

## 2018-08-07 NOTE — MAU Provider Note (Signed)
Chief Complaint:  Vaginal Bleeding   First Provider Initiated Contact with Patient 08/07/18 2013     HPI: Kelly [name] is a 33 y.o. [id number] at 22w5dho presents to maternity admissions reporting pelvic pressure and pink discharge   Was seen for her new OB today.  Did have a pap smear done.  She reports good fetal movement, denies LOF, vaginal bleeding, vaginal itching/burning, urinary symptoms, h/a, dizziness, n/v, diarrhea, constipation or fever/chills.  She denies headache, visual changes or RUQ abdominal pain.  Vaginal Bleeding  The patient's primary symptoms include vaginal bleeding. The patient's pertinent negatives include no genital itching, genital lesions, genital odor or pelvic pain. This is a new problem. The current episode started today. The problem has been unchanged. She is pregnant. Pertinent negatives include no abdominal pain, back pain, chills, constipation or diarrhea. The vaginal discharge was bloody (pink). The vaginal bleeding is spotting. She has not been passing clots. She has not been passing tissue. Nothing aggravates the symptoms. She has tried nothing for the symptoms.    RN Note: PT SAYS  SHE FEELS  PRESSURE-  STARTED  YESTERDAY  AT 4PM-   HAS CONTINUED.   HER VAG AREA ACHES. THEN TONIGHT AT 64PM  WENT TO B-ROOM- HAD PINK D/C ON PAD- WIPED   SAME AS ON PAD.    LAST SEX-  DEC  Past Medical History: Past Medical History:  Diagnosis Date  . 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 Abscess of breast    rt breast  . ADD (attention deficit disorder)   . Back pain   . Bipolar 1 disorder ([place]   . Hypertension   . Leukocytosis   . Polysubstance abuse (HP[place]   . Sepsis (HDiamond [place] 12/02/2017  . Suicidal overdose (H[place]     Past obstetric history: OB History  Gravida Para Term Preterm AB Living  5 2 2   2 2   SAB TAB Ectopic Multiple Live Births  2       2    # Outcome Date GA Lbr Len/2nd Weight Sex Delivery Anes PTL Lv  5 Current            4 Term 2017     VBAC   LIV  3 Term 05/2007     CS-LTranv   LIV     Complications: Failure to Progress in First Stage  2 SAB           1 SAB             Past Surgical History: Past Surgical History:  Procedure Laterality Date  . BACK SURGERY     scoliosis-in care everywhere 08/1999  . CESAREAN SECTION    . CHOLECYSTECTOMY  March 2015  . FOREIGN BODY REMOVAL Right 05/28/2016   Procedure: REMOVAL FOREIGN BODY RIGHT ARM;  Surgeon: DMeredith Pel MD;  [place]  Service: Orthopedics;  Laterality: Right;  . IRRIGATION AND DEBRIDEMENT ABSCESS Right 07/13/2014   Procedure: IRRIGATION AND DEBRIDEMEN TRIGHT BREAST ABSCESS;  Surgeon: MDonnie Mesa MD;  [place]  Service: General;  Laterality: Right;  . LUMBAR LAMINECTOMY/DECOMPRESSION MICRODISCECTOMY N/A 12/05/2017   Procedure: LUMBAR LAMINECTOMY FOR EPIDURAL ABSCESS;  Surgeon: JNewman Pies MD;  [place]  Service: Neurosurgery;  Laterality: N/A;  . MULTIPLE EXTRACTIONS WITH ALVEOLOPLASTY Bilateral 07/03/2018   Procedure: Extraction of tooth #'s 30 and 31 with alveoloplasty;  Surgeon: KLenn Cal DDS;  [place]  Service: Oral Surgery;  Laterality:  Bilateral;  . RADIOLOGY WITH ANESTHESIA N/A 12/05/2017   Procedure: MRI WITH ANESTHESIA OF [facility] AND LUMBAR SPINE WITH AND WITHOUT;  Surgeon: Radiologist, Medication, MD;  Location: [place];  Service: Radiology;  Laterality: N/A;    Family History: Family History  Problem Relation Age of Onset  . Diabetes Mother   . Diabetes Father   . Hypertension Father   . Heart attack Neg Hx   . Hyperlipidemia Neg Hx   . Sudden death Neg Hx     Social History: Social History   Tobacco Use  . Smoking status: Current Some Day Smoker    Packs/day: 1.00    Years: 13.00    Pack years: 13.00    Types: Cigarettes  . Smokeless tobacco: Never Used  Substance Use Topics  . Alcohol use: Yes    Comment: one or two drinks weekly  . Drug use: Yes    Types: Cocaine,  Heroin, IV    Comment: heroin, crack cocaine last used 06/13/2018    Allergies:  Allergies  Allergen Reactions  . Hydrocodone Itching  . Morphine And Related Nausea And Vomiting    Meds:  Medications Prior to Admission  Medication Sig Dispense Refill Last Dose  . acetaminophen (TYLENOL) 500 MG tablet Take 2 tablets (1,000 mg total) by mouth every 8 (eight) hours. 30 tablet 0 Taking  . docusate sodium (COLACE) 100 MG capsule Take 1 capsule (100 mg total) by mouth 2 (two) times daily. 10 capsule 0 Taking  . feeding supplement, ENSURE ENLIVE, (ENSURE ENLIVE) LIQD Take 237 mLs by mouth 2 (two) times daily between meals. 237 mL 12 Taking  . ferrous sulfate 325 (65 FE) MG tablet Take 1 tablet (325 mg total) by mouth daily with breakfast. (Patient not taking: Reported on 04/01/2018) 30 tablet 0 Not Taking  . ibuprofen (ADVIL,MOTRIN) 400 MG tablet Take 1 tablet (400 mg total) by mouth every 6 (six) hours as needed for moderate pain. (Patient not taking: Reported on 08/07/2018) 30 tablet 0 Not Taking  . magnesium oxide (MAG-OX) 400 (241.3 Mg) MG tablet Take 1 tablet (400 mg total) by mouth 2 (two) times daily. (Patient not taking: Reported on 04/01/2018) 60 tablet 0 Not Taking  . methadone (DOLOPHINE) 10 MG tablet Take 10 tablets (100 mg total) by mouth daily. (Patient not taking: Reported on 08/07/2018) 30 tablet 0 Not Taking  . methadone (DOLOPHINE) 10 MG/ML solution Take 10 mg by mouth every 8 (eight) hours.   Taking  . nystatin (MYCOSTATIN/NYSTOP) powder Apply topically 2 (two) times daily. 15 g 0 Taking  . pantoprazole (PROTONIX) 40 MG tablet Take 1 tablet (40 mg total) by mouth daily. (Patient not taking: Reported on 04/01/2018) 30 tablet 0 Not Taking  . Prenatal Vit-Fe Fumarate-FA (PRENATAL MULTIVITAMIN) TABS tablet Take 1 tablet by mouth daily at 12 noon.   Taking    I have reviewed patient's Past Medical Hx, Surgical Hx, Family Hx, Social Hx, medications and allergies.   ROS:  Review of  Systems  Constitutional: Negative for chills.  Gastrointestinal: Negative for abdominal pain, constipation and diarrhea.  Genitourinary: Positive for vaginal bleeding. Negative for pelvic pain.  Musculoskeletal: Negative for back pain.   Other systems negative  Physical Exam   Patient Vitals for the past 24 hrs:  BP Temp Pulse Resp Height Weight  08/07/18 2002 118/84 97.9 F (36.6 C) (!) 104 20 5' 7"  (1.702 m) 84 kg   Constitutional: Well-developed, well-nourished female in no acute distress.  Cardiovascular: normal rate  and rhythm Respiratory: normal effort, clear to auscultation bilaterally GI: Abd soft, non-tender, gravid appropriate for gestational age.   No rebound or guarding. MS: Extremities nontender, no edema, normal ROM Neurologic: Alert and oriented x 4.  GU: Neg CVAT.  PELVIC EXAM: Cervix pink, visually closed, without lesion, scant white creamy discharge, vaginal walls and external genitalia normal    NO BLOOD IN VAULT Bimanual exam: Cervix firm, posterior, neg CMT, uterus nontender   FHT:  Baseline 140-150 , moderate variability, accelerations present, no decelerations Contractions: none   Labs: No results found for this or any previous visit (from the past 24 hour(s)).  --/--/O POS (02/04 1040)  Imaging:    MAU Course/MDM: I have ordered labs and reviewed results.  NST reviewed and is reactive.  No contractions  Treatments in MAU included EFM, speculum exam.    Assessment: Single intrauterine pregnancy at 90w6dPink discharge at home, none on exam Likely post pap friability  Plan: Discharge home Preterm Labor precautions and fetal kick counts Follow up in Office for prenatal visits and recheck  Encouraged to return here or to other Urgent Care/ED if she develops worsening of symptoms, increase in pain, fever, or other concerning symptoms.   Pt stable at time of discharge.  MHansel FeinsteinCNM, MSN Certified Nurse-Midwife 08/07/2018 8:13 PM

## 2018-08-07 NOTE — Discharge Instructions (Signed)
Preterm Labor and Birth Information Pregnancy normally lasts 39-41 weeks. Preterm labor is when labor starts early. It starts before you have been pregnant for 37 whole weeks. What are the risk factors for preterm labor? Preterm labor is more likely to occur in women who:  Have an infection while pregnant.  Have a cervix that is short.  Have gone into preterm labor before.  Have had surgery on their cervix.  Are younger than age 34.  Are older than age 71.  Are African American.  Are pregnant with two or more babies.  Take street drugs while pregnant.  Smoke while pregnant.  Do not gain enough weight while pregnant.  Got pregnant right after another pregnancy. What are the symptoms of preterm labor? Symptoms of preterm labor include:  Cramps. The cramps may feel like the cramps some women get during their period. The cramps may happen with watery poop (diarrhea).  Pain in the belly (abdomen).  Pain in the lower back.  Regular contractions or tightening. It may feel like your belly is getting tighter.  Pressure in the lower belly that seems to get stronger.  More fluid (discharge) leaking from the vagina. The fluid may be watery or bloody.  Water breaking. Why is it important to notice signs of preterm labor? Babies who are born early may not be fully developed. They have a higher chance for:  Long-term heart problems.  Long-term lung problems.  Trouble controlling body systems, like breathing.  Bleeding in the brain.  A condition called cerebral palsy.  Learning difficulties.  Death. These risks are highest for babies who are born before 62 weeks of pregnancy. How is preterm labor treated? Treatment depends on:  How long you were pregnant.  Your condition.  The health of your baby. Treatment may involve:  Having a stitch (suture) placed in your cervix. When you give birth, your cervix opens so the baby can come out. The stitch keeps the cervix  from opening too soon.  Staying at the hospital.  Taking or getting medicines, such as: ? Hormone medicines. ? Medicines to stop contractions. ? Medicines to help the babys lungs develop. ? Medicines to prevent your baby from having cerebral palsy. What should I do if I am in preterm labor? If you think you are going into labor too soon, call your doctor right away. How can I prevent preterm labor?  Do not use any tobacco products. ? Examples of these are cigarettes, chewing tobacco, and e-cigarettes. ? If you need help quitting, ask your doctor.  Do not use street drugs.  Do not use any medicines unless you ask your doctor if they are safe for you.  Talk with your doctor before taking any herbal supplements.  Make sure you gain enough weight.  Watch for infection. If you think you might have an infection, get it checked right away.  If you have gone into preterm labor before, tell your doctor. This information is not intended to replace advice given to you by your health care provider. Make sure you discuss any questions you have with your health care provider. Document Released: 07/27/2008 Document Revised: 10/11/2015 Document Reviewed: 09/21/2015 Elsevier Interactive Patient Education  2019 [employer].

## 2018-08-07 NOTE — Telephone Encounter (Signed)
New message:    Patient calling to get a appt. Patient was in hospital from 02/20 to 08/04/18 patient is pregnant and needed a appt. Patient has to get her baby monitor at 1:30pm today. Patient asked if nurse could leave a voice mail on her phone.

## 2018-08-07 NOTE — MAU Note (Signed)
PT SAYS  SHE FEELS  PRESSURE-  STARTED  YESTERDAY  AT 4PM-   HAS CONTINUED.   HER VAG AREA ACHES. THEN TONIGHT AT 12PM-  WENT TO B-ROOM- HAD PINK D/C ON PAD- WIPED   SAME AS ON PAD.    LAST SEX-  DEC

## 2018-08-07 NOTE — Progress Notes (Signed)
Patient is in the office for NOB, reports fetal movement with occasional pressure.

## 2018-08-07 NOTE — Telephone Encounter (Signed)
Spoke with patient and she stated she was told to follow up with our office. Reviewed D/C summary and patients D/C AVS nothing about following up with cardiology noted. Patient stated that infectious disease would not see her without cardiology seeing her first. Did have Doreene Burke PA review information and he advised to follow up with those listed on d/c summary. Advised patient, verbalized understanding.

## 2018-08-08 LAB — CYTOLOGY - PAP
Chlamydia: NEGATIVE
Diagnosis: NEGATIVE
HPV: NOT DETECTED
Neisseria Gonorrhea: NEGATIVE

## 2018-08-09 ENCOUNTER — Telehealth: Payer: Self-pay

## 2018-08-09 LAB — URINE CULTURE, OB REFLEX: Organism ID, Bacteria: NO GROWTH

## 2018-08-09 LAB — CULTURE, OB URINE

## 2018-08-09 NOTE — Telephone Encounter (Signed)
Ms. Senseney called 08/08/2018 to the Hospitalists' office where she was directed to me. She stated that her prescriptions were not called in and she had not been able to see ID and [facility] as advised because they had not received her referrals. She had seen OB.   It appeared that no prescriptions were called/sent in.  I contacted Dr. Erlinda Hong and she called them into [name] on [address]. The patient needs actual prescriptions, even for the OTC meds, bc she has to be administered these medications by the "[facility] the [facility]" staff. Dr. Erlinda Hong messaged ID and [place] MDs to facilitate the referrals. Dr. Everrett Coombe note was faxed to his office to serve as a referral.  CM will f/u Monday to facilitate referrals.

## 2018-08-14 ENCOUNTER — Other Ambulatory Visit: Payer: Self-pay | Admitting: Physician Assistant

## 2018-08-14 ENCOUNTER — Other Ambulatory Visit: Payer: Medicaid Other

## 2018-08-14 DIAGNOSIS — I330 Acute and subacute infective endocarditis: Principal | ICD-10-CM

## 2018-08-14 DIAGNOSIS — B9561 Methicillin susceptible Staphylococcus aureus infection as the cause of diseases classified elsewhere: Secondary | ICD-10-CM

## 2018-08-18 ENCOUNTER — Telehealth: Payer: Self-pay | Admitting: Cardiovascular Disease

## 2018-08-18 NOTE — Telephone Encounter (Signed)
Patient called and discussed cancelling elective echocardiogram due to concerns for Covid19 and need for social distancing . Patient is pregnant at [redacted] weeks and has known TV vegetations would keep echo on schedule for 4/14 at 1`:00 and f/u video visit with Dr Percival Spanish  Confirmed appointment and gave directions to office

## 2018-08-19 ENCOUNTER — Other Ambulatory Visit: Payer: Medicaid Other

## 2018-08-25 ENCOUNTER — Other Ambulatory Visit: Payer: Medicaid Other

## 2018-08-26 ENCOUNTER — Other Ambulatory Visit: Payer: Self-pay

## 2018-08-26 ENCOUNTER — Ambulatory Visit (HOSPITAL_COMMUNITY): Payer: Medicaid Other | Attending: Cardiology

## 2018-08-26 DIAGNOSIS — B9561 Methicillin susceptible Staphylococcus aureus infection as the cause of diseases classified elsewhere: Secondary | ICD-10-CM | POA: Diagnosis present

## 2018-08-26 DIAGNOSIS — I330 Acute and subacute infective endocarditis: Secondary | ICD-10-CM | POA: Diagnosis present

## 2018-08-27 ENCOUNTER — Ambulatory Visit (INDEPENDENT_AMBULATORY_CARE_PROVIDER_SITE_OTHER): Payer: Medicaid Other | Admitting: Obstetrics and Gynecology

## 2018-08-27 ENCOUNTER — Encounter: Payer: Self-pay | Admitting: Obstetrics and Gynecology

## 2018-08-27 DIAGNOSIS — F1910 Other psychoactive substance abuse, uncomplicated: Secondary | ICD-10-CM | POA: Diagnosis not present

## 2018-08-27 DIAGNOSIS — Z98891 History of uterine scar from previous surgery: Secondary | ICD-10-CM

## 2018-08-27 DIAGNOSIS — O10913 Unspecified pre-existing hypertension complicating pregnancy, third trimester: Secondary | ICD-10-CM

## 2018-08-27 DIAGNOSIS — O09893 Supervision of other high risk pregnancies, third trimester: Secondary | ICD-10-CM

## 2018-08-27 DIAGNOSIS — Z3A30 30 weeks gestation of pregnancy: Secondary | ICD-10-CM

## 2018-08-27 DIAGNOSIS — O34219 Maternal care for unspecified type scar from previous cesarean delivery: Secondary | ICD-10-CM

## 2018-08-27 DIAGNOSIS — I071 Rheumatic tricuspid insufficiency: Secondary | ICD-10-CM

## 2018-08-27 DIAGNOSIS — O10919 Unspecified pre-existing hypertension complicating pregnancy, unspecified trimester: Secondary | ICD-10-CM

## 2018-08-27 MED ORDER — POLYETHYLENE GLYCOL 3350 17 G PO PACK: 17.0000 g | PACK | Freq: Three times a day (TID) | ORAL | 0 refills | Status: AC

## 2018-08-27 MED ORDER — BISACODYL 5 MG PO TBEC: 5.0000 mg | DELAYED_RELEASE_TABLET | Freq: Every day | ORAL | 0 refills | Status: DC | PRN

## 2018-08-27 NOTE — Progress Notes (Signed)
Pt states she has not had a Bowel movement in 3 weeks. Pt notes cramping and hard stool.   Pt has to have Rx called in due to living at Room at the [facility] no over the counter Rx are allowed per pt. Need Rx for Tylenol as well.

## 2018-08-28 ENCOUNTER — Encounter: Payer: Self-pay | Admitting: Obstetrics and Gynecology

## 2018-08-28 ENCOUNTER — Telehealth: Payer: Self-pay | Admitting: Physician Assistant

## 2018-08-28 NOTE — Telephone Encounter (Signed)
   I reviewed the echocardiogram with Dr. Gwenlyn Found, and contacted her by phone.  She is having some problems with dyspnea on exertion which has been going on for a while.  She struggles to climb stairs.  She is not short of breath at rest.  She has some lower extremity edema, not severe.  She is emphatic that she is living clean right now, taking her medications and not smoking.  She is a little concerned because she states that she thought she was supposed to be seeing the surgeons after discharge, but that has not happened yet.  I explained that Dr. Gwenlyn Found can review things with her and it is entirely possible that the surgeons may not need to see her until after the baby is born.  I was able to make her an appointment for tomorrow at 11:15 with Dr. Gwenlyn Found.  She said this would work with her schedule.  I gave her the address and explained the location of the office.  She stated she would be there.  Rosaria Ferries, PA-C 08/28/2018 11:06 AM Bee[place] [phone]

## 2018-08-28 NOTE — Progress Notes (Signed)
TELEHEALTH VIRTUAL OBSTETRICS VISIT ENCOUNTER NOTE  I connected with Kelly [name] on 08/27/18 at  1:45 PM EDT by telephone at home and verified that I am speaking with the correct person using two identifiers.   I discussed the limitations, risks, security and privacy concerns of performing an evaluation and management service by telephone and the availability of in person appointments. I also discussed with the patient that there may be a patient responsible charge related to this service. The patient expressed understanding and agreed to proceed.  Subjective:  Kelly [name] is a 33 y.o. [id number] at 38w4dbeing followed for ongoing prenatal care.  She is currently monitored for the following issues for this high-risk pregnancy and has COLONIC POLYPS; CROHN'S DISEASE; Thoracic back pain; Opioid use disorder, severe, dependence (HH[place]; Unspecified episodic mood disorder; ADHD (attention deficit hyperactivity disorder); Cocaine abuse (HBella Villa; Abscess of right breast; Polysubstance abuse (HL[place]; Chronic hypertension affecting pregnancy; History of cesarean delivery affecting pregnancy; Community acquired pneumonia; Foreign body granuloma of soft tissue, NEC, right upper arm; Major depressive disorder, recurrent episode with anxious distress (HHur[place]; Bipolar disorder (H[place]; Sepsis due to pneumonia (HR[place]; Difficult intravenous access; IVDU (intravenous drug user); AKI (acute kidney injury) (HCl[place]; 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  Patient reports no complaints. Reports fetal movement. Denies any contractions, bleeding or leaking of fluid.   The following portions of the patient's history were reviewed and updated as appropriate: allergies, current medications, past family history, past medical history, past social history, past surgical history and problem list.   Objective:   General:  Alert, oriented and cooperative.   Mental Status: Normal mood and affect perceived. Normal judgment and thought content.  Rest of physical exam deferred due to type of encounter  Assessment and Plan:  Pregnancy: [id number] 324w4d. Supervision of other high risk pregnancies, third trimester BTL papers UTD. Has visit on Friday for 1h. F/u bp then - Babyscripts Schedule Optimization  2. Chronic hypertension affecting pregnancy F/u BP at next visit. On no meds  3. Severe tricuspid regurgitation by prior echocardiogram In basket message sent to cardiologist re: recent echo and any peripartum plans, such as route of delivery, need for special lines. Will also need to know if abx are indicated based on delivery plans. Once I hear back from him, I can email anesthesia so they are aware of her and mfm about timing of delivery. Pt has no s/s.   4. Polysubstance abuse (HCInverness 5. History of cesarean delivery affecting pregnancy  6. History of VBAC  Preterm labor symptoms and general obstetric precautions including but not limited to vaginal bleeding, contractions, leaking of fluid and fetal movement were reviewed in detail with the patient.  I discussed the assessment and treatment plan with the patient. The patient was provided an opportunity to ask questions and all were answered. The patient agreed with the plan and demonstrated an understanding of the instructions. The patient was  advised to call back or seek an in-person office evaluation/go [place] [facility] at [place]  any urgent or concerning symptoms. Please refer to After Visit Summary for other counseling recommendations.   I provided 20 minutes of non-face-to-face time during this encounter.  No follow-ups on file.  Future Appointments  Date Time Provider [place]  08/29/2018  9:00 AM [facility] None  08/29/2018  1:30 PM [place] [facility]  08/29/2018  1:30 PM [place] 5 WH-MFCUS MFC-US  09/08/2018  2:00 PM Tommy Medal, Lavell Islam, MD [facility]  09/12/2018 10:30 AM [place] Mode, MD [facility] None    Aletha Halim, MD [name], [place]

## 2018-08-29 ENCOUNTER — Ambulatory Visit (INDEPENDENT_AMBULATORY_CARE_PROVIDER_SITE_OTHER): Payer: Medicaid Other | Admitting: Cardiovascular Disease

## 2018-08-29 ENCOUNTER — Encounter: Payer: Self-pay | Admitting: Cardiovascular Disease

## 2018-08-29 ENCOUNTER — Encounter: Payer: Self-pay | Admitting: Obstetrics & Gynecology

## 2018-08-29 ENCOUNTER — Telehealth: Payer: Self-pay | Admitting: Obstetrics and Gynecology

## 2018-08-29 ENCOUNTER — Ambulatory Visit (HOSPITAL_COMMUNITY): Payer: Medicaid Other | Admitting: *Deleted

## 2018-08-29 ENCOUNTER — Encounter (HOSPITAL_COMMUNITY): Payer: Self-pay

## 2018-08-29 ENCOUNTER — Telehealth: Payer: Self-pay | Admitting: Cardiovascular Disease

## 2018-08-29 ENCOUNTER — Ambulatory Visit (HOSPITAL_COMMUNITY)
Admission: RE | Admit: 2018-08-29 | Discharge: 2018-08-29 | Disposition: A | Discharge status: Home / Self Care | Payer: Medicaid Other | Source: Ambulatory Visit | Attending: Obstetrics and Gynecology | Admitting: Obstetrics and Gynecology

## 2018-08-29 ENCOUNTER — Other Ambulatory Visit: Payer: Self-pay

## 2018-08-29 ENCOUNTER — Other Ambulatory Visit: Payer: Medicaid Other

## 2018-08-29 ENCOUNTER — Other Ambulatory Visit: Payer: Self-pay | Admitting: Obstetrics and Gynecology

## 2018-08-29 VITALS — Temp 97.6°F

## 2018-08-29 DIAGNOSIS — Z8679 Personal history of other diseases of the circulatory system: Secondary | ICD-10-CM | POA: Diagnosis not present

## 2018-08-29 DIAGNOSIS — Z362 Encounter for other antenatal screening follow-up: Secondary | ICD-10-CM | POA: Diagnosis present

## 2018-08-29 DIAGNOSIS — O10919 Unspecified pre-existing hypertension complicating pregnancy, unspecified trimester: Secondary | ICD-10-CM | POA: Insufficient documentation

## 2018-08-29 DIAGNOSIS — O99323 Drug use complicating pregnancy, third trimester: Secondary | ICD-10-CM

## 2018-08-29 DIAGNOSIS — O99013 Anemia complicating pregnancy, third trimester: Secondary | ICD-10-CM

## 2018-08-29 DIAGNOSIS — O10013 Pre-existing essential hypertension complicating pregnancy, third trimester: Secondary | ICD-10-CM

## 2018-08-29 DIAGNOSIS — O99413 Diseases of the circulatory system complicating pregnancy, third trimester: Secondary | ICD-10-CM

## 2018-08-29 DIAGNOSIS — I071 Rheumatic tricuspid insufficiency: Secondary | ICD-10-CM

## 2018-08-29 DIAGNOSIS — I330 Acute and subacute infective endocarditis: Secondary | ICD-10-CM | POA: Insufficient documentation

## 2018-08-29 DIAGNOSIS — I5022 Chronic systolic (congestive) heart failure: Secondary | ICD-10-CM | POA: Diagnosis not present

## 2018-08-29 DIAGNOSIS — R0989 Other specified symptoms and signs involving the circulatory and respiratory systems: Secondary | ICD-10-CM

## 2018-08-29 DIAGNOSIS — O99343 Other mental disorders complicating pregnancy, third trimester: Secondary | ICD-10-CM

## 2018-08-29 DIAGNOSIS — Z3A30 30 weeks gestation of pregnancy: Secondary | ICD-10-CM

## 2018-08-29 MED ORDER — BETAMETHASONE SOD PHOS & ACET 6 (3-3) MG/ML IJ SUSP: 12.0000 mg | INTRAMUSCULAR | Status: DC

## 2018-08-29 NOTE — Assessment & Plan Note (Signed)
Kelly [name] has a new decline in EF from her previous echo in February when it was normal.  Currently it is in the 30 to 35% range.  Etiology is unknown.  She does have a history of the drug abuse and had endocarditis with tricuspid vegetation which has since resolved.  She has increasing dyspnea on exertion and is [redacted] weeks pregnant.  She will need high risk OB care at [facility].  I discussed this with Dr. Haroldine Laws as well.  Because of her low systolic blood pressure I am hesitant to treat her with heart failure medications at this time.

## 2018-08-29 NOTE — Patient Instructions (Addendum)
Medication Instructions:  Your physician recommends that you continue on your current medications as directed. Please refer to the Current Medication list given to you today.  If you need a refill on your cardiac medications before your next appointment, please call your pharmacy.   Lab work: NONE If you have labs (blood work) drawn today and your tests are completely normal, you will receive your results only by: Marland Kitchen MyChart Message (if you have MyChart) OR . A paper copy in the mail If you have any lab test that is abnormal or we need to change your treatment, we will call you to review the results.  Testing/Procedures: NONE  Follow-Up: At [facility], you and your health needs are our priority.  As part of our continuing mission to provide you with exceptional heart care, we have created designated Provider Care Teams.  These Care Teams include your primary Cardiologist (physician) and Advanced Practice Providers (APPs -  Physician Assistants and Nurse Practitioners) who all work together to provide you with the care you need, when you need it. You will need a follow up appointment in [place].  Please call our office 2 months in advance to schedule this appointment.    Any Other Special Instructions Will Be Listed Below (If Applicable). REFERRAL TO ADVANCED HEART FAILURE CLINIC WITH DR. DANIEL BENSIMHON. THIS APPOINTMENT SHOULD BE SCHEDULED AFTER YOU GIVE BIRTH.   PLEASE FOLLOW UP WITH YOUR OBGYN. DR. Gwenlyn Found WILL DISCUSS HIS RECOMMENDATIONS WITH DR. Hulan Fray.

## 2018-08-29 NOTE — Progress Notes (Signed)
08/29/2018 Kelly [name]   September 10, 1985  [id number]  [facility] Primary Cardiologist: Lorretta Harp MD FACP, [place], [place], [place]  HPI:  Kelly [name] is a 33 y.o. single Caucasian female mother of 1 child (1 child was put up for adoption) who is currently [redacted] weeks pregnant.  I am seeing her back today because of decreased LV function.  She currently does not work.  She has a history of IV drug abuse as well as a 15-pack-year history tobacco abuse having quit 2 months ago.  I originally met her in the hospital in July when she was admitted with endocarditis and a lumbar abscess.  At that time she has severe TR and a tricuspid vegetation.  She was treated with IV antibiotics had a prolonged hospitalization.  He was readmitted on 06/14/2018 with sepsis, pneumonia and endocarditis which was recurrent because of the drug abuse.  Echo performed 06/16/2018 showed a large tricuspid vegetation, severe TR and normal LV function.  Her most recent echo performed 08/26/2018 revealed significant drop in LV function down to an EF of 30%.  The tricuspid vegetation has resolved however.  She continues to have severe TR complaints of mild increase in dyspnea though she does not appear to be volume overloaded.  She denies chest pain.   Current Meds  Medication Sig  . acetaminophen (TYLENOL) 500 MG tablet Take 2 tablets (1,000 mg total) by mouth every 8 (eight) hours.  . bisacodyl (BISACODYL) 5 MG EC tablet Take 1 tablet (5 mg total) by mouth daily as needed for moderate constipation.  . feeding supplement, ENSURE ENLIVE, (ENSURE ENLIVE) LIQD Take 237 mLs by mouth 2 (two) times daily between meals.  . magnesium oxide (MAG-OX) 400 (241.3 Mg) MG tablet Take 1 tablet (400 mg total) by mouth 2 (two) times daily.  . methadone (DOLOPHINE) 10 MG/ML solution Take 10 mg by mouth every 8 (eight) hours.  Marland Kitchen nystatin (MYCOSTATIN/NYSTOP) powder Apply topically 2 (two)  times daily.  . pantoprazole (PROTONIX) 40 MG tablet Take 1 tablet (40 mg total) by mouth daily.  . polyethylene glycol (MIRALAX) 17 g packet Take 17 g by mouth 3 (three) times daily for 30 days.  . Prenatal Vit-Fe Fumarate-FA (PRENATAL MULTIVITAMIN) TABS tablet Take 1 tablet by mouth daily at 12 noon.     Allergies  Allergen Reactions  . Hydrocodone Itching  . Morphine And Related Nausea And Vomiting    Social History   Socioeconomic History  . Marital status: Single    Spouse name: Not on file  . Number of children: Not on file  . Years of education: Not on file  . Highest education level: Not on file  Occupational History  . Not on file  Social Needs  . Financial resource strain: Not on file  . Food insecurity:    Worry: Not on file    Inability: Not on file  . Transportation needs:    Medical: Not on file    Non-medical: Not on file  Tobacco Use  . Smoking status: Former Smoker    Packs/day: 1.00    Years: 13.00    Pack years: 13.00    Types: Cigarettes    Last attempt to quit: 06/13/2018    Years since quitting: 0.2  . Smokeless tobacco: Never Used  Substance and Sexual Activity  . Alcohol use: Not Currently    Comment: one or two drinks weekly  . Drug use: Yes  Types: Cocaine, Heroin, IV    Comment: heroin, crack cocaine last used 06/13/2018  . Sexual activity: Yes    Birth control/protection: Condom  Lifestyle  . Physical activity:    Days per week: Not on file    Minutes per session: Not on file  . Stress: Not on file  Relationships  . Social connections:    Talks on phone: Not on file    Gets together: Not on file    Attends religious service: Not on file    Active member of club or organization: Not on file    Attends meetings of clubs or organizations: Not on file    Relationship status: Not on file  . Intimate partner violence:    Fear of current or ex partner: Not on file    Emotionally abused: Not on file    Physically abused: Not on file     Forced sexual activity: Not on file  Other Topics Concern  . Not on file  Social History Narrative  . Not on file     Review of Systems: General: negative for chills, fever, night sweats or weight changes.  Cardiovascular: negative for chest pain, dyspnea on exertion, edema, orthopnea, palpitations, paroxysmal nocturnal dyspnea or shortness of breath Dermatological: negative for rash Respiratory: negative for cough or wheezing Urologic: negative for hematuria Abdominal: negative for nausea, vomiting, diarrhea, bright red blood per rectum, melena, or hematemesis Neurologic: negative for visual changes, syncope, or dizziness All other systems reviewed and are otherwise negative except as noted above.    Blood pressure 100/74, pulse 100, temperature (!) 97.5 F (36.4 C), weight 183 lb 6.4 oz (83.2 kg), last menstrual period 12/30/2017, SpO2 98 %, unknown if currently breastfeeding.  General appearance: alert and no distress Neck: JVD - 7 cm above sternal notch, no adenopathy, no carotid bruit, no JVD, supple, symmetrical, trachea midline, thyroid not enlarged, symmetric, no tenderness/mass/nodules and Otherwise normal Lungs: clear to auscultation bilaterally Heart: Soft outflow tract murmur at left upper sternal border consistent with tricuspid regurgitation. Extremities: Mildly cyanotic Pulses: 2+ and symmetric Skin: Skin color, texture, turgor normal. No rashes or lesions Neurologic: Alert and oriented X 3, normal strength and tone. Normal symmetric reflexes. Normal coordination and gait  EKG normal sinus rhythm 82 without ST or T wave changes.  I personally reviewed this EKG.  ASSESSMENT AND PLAN:   History of bacterial endocarditis History of bacterial endocarditis back in July 2019 treated with antibiotics.  That time she had severe TR and a tricuspid vegetation.  She was also being treated for a lumbar abscess.  She was readmitted 06/14/2018 with sepsis, pneumonia and recurrent  endocarditis due to IV drug abuse.  Again she had severe TR by echo on 06/16/2018 with a tricuspid vegetation and normal LV function.  Recent echo performed fourth/14/20 showed severe decline in LVEF to 30%.  The tricuspid vegetation has resolved although she still has severe TR.  Chronic systolic heart failure [facility]) Ms. Hilger has a new decline in EF from her previous echo in February when it was normal.  Currently it is in the 30 to 35% range.  Etiology is unknown.  She does have a history of the drug abuse and had endocarditis with tricuspid vegetation which has since resolved.  She has increasing dyspnea on exertion and is [redacted] weeks pregnant.  She will need high risk OB care at [facility].  I discussed this with Dr. Haroldine Laws as well.  Because of her  low systolic blood pressure I am hesitant to treat her with heart failure medications at this time.      Lorretta Harp MD FACP,FACC,FAHA, [facility] 08/29/2018 12:13 PM

## 2018-08-29 NOTE — Progress Notes (Signed)
ant

## 2018-08-29 NOTE — Addendum Note (Signed)
Addended by: Annita Brod on: 08/29/2018 12:52 PM   Modules accepted: Orders

## 2018-08-29 NOTE — Telephone Encounter (Signed)
Telephone Call from Patient who is expecting a call from [facility] regarding location of delivery. Pt used after hours Nurse service to contact:  Pt has developed Left Ventricular Moderate-Severely reduced systolic function, with EF of 30-35%, down from 60-65% in Feb. Pt has severe Tricuspid Regurgitation, tho Tricuspid vegetation is resolved. Pt is somewhat short of breath, denies any recent weight gain,   weight today 183.6 oz, (83.2 kg), was 84.1 kg at hospital d/c 08/01/2018. Unlabored breathing during conversation.  Dr Kennon Holter cardiology note suggests pt be managed at [facility]. The patient understands this is so she could be considered for surgery as soon as delivery completed.   Pt seen at MFM for growth u/s today, baby growing at 60%ile. Antenatal testing scheduled to start after next visit (32 w) Dr Jaquita Rector note indicates that decision on place of delivery to be decided at next [facility] appt 4/22 Dr Rip Harbour.    Case discussed with Dr Gertie Exon, MFM, and we have agreed to : - have pt come in for Betamethasone 12 mg IM q 24 x 2 over weekend. -Consult written for MFM to review chart , assist with planning time of delivery, if pt remains stable at current EF levels. -Pt to come to MAU asap for any deterioration in breathing, significant weight gain, or pt concerns, and may require hospital admission. - Pt to keep appt with Dr Rip Harbour next week.   Pt aware of plan and agrees to come to MAU 9 :30 am Sat and Sun.

## 2018-08-29 NOTE — Telephone Encounter (Signed)
Encounter not needed

## 2018-08-29 NOTE — Assessment & Plan Note (Signed)
History of bacterial endocarditis back in July 2019 treated with antibiotics.  That time she had severe TR and a tricuspid vegetation.  She was also being treated for a lumbar abscess.  She was readmitted 06/14/2018 with sepsis, pneumonia and recurrent endocarditis due to IV drug abuse.  Again she had severe TR by echo on 06/16/2018 with a tricuspid vegetation and normal LV function.  Recent echo performed fourth/14/20 showed severe decline in LVEF to 30%.  The tricuspid vegetation has resolved although she still has severe TR.

## 2018-08-29 NOTE — Progress Notes (Signed)
Order placed for Betamethasone 12 mg im q24 x 2 via MAU visit. Pt aware.

## 2018-08-30 ENCOUNTER — Inpatient Hospital Stay (HOSPITAL_COMMUNITY)
Admission: AD | Admit: 2018-08-30 | Discharge: 2018-08-30 | Disposition: A | Discharge status: Home / Self Care | Payer: Medicaid Other | Attending: Obstetrics and Gynecology | Admitting: Obstetrics and Gynecology

## 2018-08-30 DIAGNOSIS — I5022 Chronic systolic (congestive) heart failure: Secondary | ICD-10-CM | POA: Insufficient documentation

## 2018-08-30 DIAGNOSIS — O99413 Diseases of the circulatory system complicating pregnancy, third trimester: Secondary | ICD-10-CM | POA: Insufficient documentation

## 2018-08-30 DIAGNOSIS — Z3A31 31 weeks gestation of pregnancy: Secondary | ICD-10-CM | POA: Insufficient documentation

## 2018-08-30 MED ORDER — BETAMETHASONE SOD PHOS & ACET 6 (3-3) MG/ML IJ SUSP
12.0000 mg | Freq: Once | INTRAMUSCULAR | Status: AC
  Administered 2018-08-30: 10:00:00 12 mg via INTRAMUSCULAR
  Filled 2018-08-30: qty 2

## 2018-08-30 NOTE — MAU Note (Signed)
Kelly [name] is a 33 y.o. at 21w0dhere in MAU reporting: here for 1st BMZ, reports no abdominal pain, vaginal bleeding, or LOF. + FM  Pain score: 0/10  Vitals:   08/30/18 0930  BP: 112/75  Pulse: 94  Resp: 18  Temp: 98 F (36.7 C)  SpO2: 100%     FHT: 134  Lab orders placed from triage: none

## 2018-08-31 ENCOUNTER — Inpatient Hospital Stay (HOSPITAL_COMMUNITY)
Admission: AD | Admit: 2018-08-31 | Discharge: 2018-08-31 | Disposition: A | Discharge status: Home / Self Care | Payer: Medicaid Other | Source: Ambulatory Visit | Attending: Obstetrics & Gynecology | Admitting: Obstetrics & Gynecology

## 2018-08-31 ENCOUNTER — Other Ambulatory Visit: Payer: Self-pay

## 2018-08-31 DIAGNOSIS — O99413 Diseases of the circulatory system complicating pregnancy, third trimester: Secondary | ICD-10-CM | POA: Insufficient documentation

## 2018-08-31 DIAGNOSIS — Z3A31 31 weeks gestation of pregnancy: Secondary | ICD-10-CM | POA: Insufficient documentation

## 2018-08-31 DIAGNOSIS — I5022 Chronic systolic (congestive) heart failure: Secondary | ICD-10-CM | POA: Diagnosis not present

## 2018-08-31 MED ORDER — BETAMETHASONE SOD PHOS & ACET 6 (3-3) MG/ML IJ SUSP
12.0000 mg | Freq: Once | INTRAMUSCULAR | Status: AC
  Administered 2018-08-31: 12 mg via INTRAMUSCULAR
  Filled 2018-08-31: qty 2

## 2018-08-31 NOTE — MAU Note (Signed)
Kelly [name] is a 33 y.o. at 28w1dhere in MAU reporting: here for 2nd BMZ, no pain, no bleeding, no LOF, + Fm  Pain score: 0/10  Vitals:   08/31/18 0947  BP: 118/76  Pulse: 98  Resp: 18  Temp: 97.7 F (36.5 C)  SpO2: 97%     FHT:141  Lab orders placed from triage: none

## 2018-09-01 ENCOUNTER — Other Ambulatory Visit (HOSPITAL_COMMUNITY): Payer: Self-pay | Admitting: *Deleted

## 2018-09-01 DIAGNOSIS — O10913 Unspecified pre-existing hypertension complicating pregnancy, third trimester: Secondary | ICD-10-CM

## 2018-09-02 ENCOUNTER — Telehealth: Payer: Self-pay | Admitting: *Deleted

## 2018-09-02 ENCOUNTER — Telehealth: Payer: Self-pay | Admitting: Cardiovascular Disease

## 2018-09-02 NOTE — Telephone Encounter (Signed)
Patient called in, stating that she was seen on 04/17 by Dr.Berry and he said something to her about being transferred to [facility] for her OB care due to her high risk pregnancy and heart failure issues. Patient is concerned, please advise on what else should be done for her.   Thanks!

## 2018-09-02 NOTE — Telephone Encounter (Signed)
Pt called to office stating she has had her cardiology appt and would like to know if our office is aware. Pt states she was told she has 30% heart function and may need to deliver at [place] with Dr Rip Harbour, he is aware of her case as well as OB team and has sent info to Anesthesia for recommendations for delivery.  Spoke with pt and made her aware that providers are aware of her case and are working on plan. Pt advised to keep appt on Thursday to hopefully discuss further.  Pt agrees to plan.

## 2018-09-02 NOTE — Telephone Encounter (Signed)
Nothing else needs to be done for her.  She can discuss with her OB whether they are comfortable proceeding with delivery here in [place] or whether they want to transfer her care to a high risk OB clinic at [facility].

## 2018-09-02 NOTE — Addendum Note (Signed)
Addended by: Leland Johns A on: 09/02/2018 04:24 PM   Modules accepted: Orders

## 2018-09-02 NOTE — Telephone Encounter (Signed)
New message   Patient is f/u about getting transferred over to [facility] due to heart function being 30%. Patient states that it was discussed in her last visit and she has not heard any information. Please call to discuss.

## 2018-09-02 NOTE — Telephone Encounter (Signed)
Pt aware of the following message from Dr. Gwenlyn Found "Nothing else needs to be done for her.  She can discuss with her OB whether they are comfortable proceeding with delivery here in [place] or whether they want to transfer her care to a high risk OB clinic at [name]."  Advised pt to reach out to her OBGYN, Dr. Hulan Fray, for further instructions. Pt verbalized understanding.

## 2018-09-03 ENCOUNTER — Encounter: Payer: Self-pay | Admitting: Obstetrics & Gynecology

## 2018-09-03 NOTE — Progress Notes (Unsigned)
Dr. Gwenlyn Found from cardiology contacted me and reccomends that she deliver at [facility].

## 2018-09-04 ENCOUNTER — Ambulatory Visit (INDEPENDENT_AMBULATORY_CARE_PROVIDER_SITE_OTHER): Payer: Medicaid Other | Admitting: Obstetrics and Gynecology

## 2018-09-04 ENCOUNTER — Other Ambulatory Visit: Payer: Medicaid Other

## 2018-09-04 ENCOUNTER — Other Ambulatory Visit: Payer: Self-pay

## 2018-09-04 ENCOUNTER — Encounter: Payer: Self-pay | Admitting: Obstetrics and Gynecology

## 2018-09-04 VITALS — BP 124/84 | HR 81 | Temp 97.0°F | Wt 188.3 lb

## 2018-09-04 DIAGNOSIS — O09893 Supervision of other high risk pregnancies, third trimester: Secondary | ICD-10-CM

## 2018-09-04 DIAGNOSIS — O10913 Unspecified pre-existing hypertension complicating pregnancy, third trimester: Secondary | ICD-10-CM

## 2018-09-04 DIAGNOSIS — O34219 Maternal care for unspecified type scar from previous cesarean delivery: Secondary | ICD-10-CM

## 2018-09-04 DIAGNOSIS — O10919 Unspecified pre-existing hypertension complicating pregnancy, unspecified trimester: Secondary | ICD-10-CM

## 2018-09-04 DIAGNOSIS — Z3009 Encounter for other general counseling and advice on contraception: Secondary | ICD-10-CM

## 2018-09-04 DIAGNOSIS — I071 Rheumatic tricuspid insufficiency: Secondary | ICD-10-CM

## 2018-09-04 DIAGNOSIS — F1910 Other psychoactive substance abuse, uncomplicated: Secondary | ICD-10-CM

## 2018-09-04 DIAGNOSIS — Z3A31 31 weeks gestation of pregnancy: Secondary | ICD-10-CM

## 2018-09-04 MED ORDER — BISACODYL 10 MG RE SUPP: 10.0000 mg | RECTAL | 2 refills | Status: DC | PRN

## 2018-09-04 MED ORDER — PROMETHAZINE HCL 12.5 MG PO TABS: 12.5000 mg | ORAL_TABLET | Freq: Four times a day (QID) | ORAL | 1 refills | Status: DC | PRN

## 2018-09-04 NOTE — Progress Notes (Signed)
Pt unable to complete 2 hr GTT

## 2018-09-04 NOTE — Progress Notes (Addendum)
Subjective:  Kelly [name] is a 33 y.o. [id number] at 40w5dbeing seen today for ongoing prenatal care.  She is currently monitored for the following issues for this high-risk pregnancy and has COLONIC POLYPS; CROHN'S DISEASE; Thoracic back pain; Opioid use disorder, severe, dependence (HC[place]; Unspecified episodic mood disorder; ADHD (attention deficit hyperactivity disorder); Cocaine abuse (HD[place]; Abscess of right breast; Polysubstance abuse (HT[place]; Chronic hypertension affecting pregnancy; History of cesarean delivery affecting pregnancy; Foreign body granuloma of soft tissue, NEC, right upper arm; Major depressive disorder, recurrent episode with anxious distress (HW[place]; Bipolar disorder (HM[place]; Difficult intravenous access; IVDU (intravenous drug user); 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); Multifocal pneumonia; Degenerative arthritis of lumbar spine; History of bacterial endocarditis; Severe tricuspid regurgitation by prior echocardiogram; Bacteremia due to Enterococcus; Influenza B; Rubella non-immune status, antepartum; Supervision of other high risk pregnancies, third trimester; Preexisting hypertension complicating pregnancy, antepartum; Dental caries; Phobia of dental procedure; Endocarditis; History of VBAC; Infection due to Streptococcus mitis group; Acute bilateral low back pain; Hematoma; PICC (peripherally inserted central catheter) flush; Chronic systolic heart failure (H[place]; and Unwanted fertility on their problem list.  Patient reports general discomforts of pregnancy and constipation.  Contractions: Irritability. Vag. Bleeding: None.  Movement: Present. Denies leaking of fluid.   The following portions of the patient's history were reviewed and updated as appropriate: allergies, current medications, past family history, past medical history, past social history, past surgical history and problem list. Problem list updated.  Objective:   Vitals:   09/04/18  1004  BP: 124/84  Pulse: 81  Temp: (!) 97 F (36.1 C)  Weight: 188 lb 4.8 oz (85.4 kg)    Fetal Status: Fetal Heart Rate (bpm): 140   Movement: Present     General:  Alert, oriented and cooperative. Patient is in no acute distress.  Skin: Skin is warm and dry. No rash noted.   Cardiovascular: Normal heart rate noted  Respiratory: Normal respiratory effort, no problems with respiration noted  Abdomen: Soft, gravid, appropriate for gestational age. Pain/Pressure: Present     Pelvic:  Cervical exam deferred        Extremities: Normal range of motion.  Edema: Trace  Mental Status: Normal mood and affect. Normal behavior. Normal judgment and thought content.   Urinalysis:      Assessment and Plan:  Pregnancy: [id number] 348w5d1. Supervision of other high risk pregnancies, third trimester Stable Pt attempted 2 hr glucola but unable to tolerate. Suspect would have been false results, as pt received BMZ x 2 this past week Will schedule pt for 1 hr gluocla next week. Instructed may take phenergan prior to if need - Obstetric Panel, Including HIV - Babyscripts Schedule Optimization - Hemoglobin A1c - bisacodyl (DULCOLAX) 10 MG suppository; Place 1 suppository (10 mg total) rectally as needed for moderate constipation.  Dispense: 12 suppository; Refill: 2 - promethazine (PHENERGAN) 12.5 MG tablet; Take 1 tablet (12.5 mg total) by mouth every 6 (six) hours as needed for nausea or vomiting.  Dispense: 30 tablet; Refill: 1  2. Chronic hypertension affecting pregnancy Stable No meds BP cuff and Baby RX Good growth last U/S F/U ordered  3. History of cesarean delivery affecting pregnancy Has had successful VBAC  4. Polysubstance abuse (HCKing[place]  5. Severe tricuspid regurgitation by prior echocardiogram See cardiology's note. They can provide support if we elect to delivery here Note has been sent to anesthesia for their in put, waiting their reply Once we have their reply will need  to discuss as OB team if OK to delivery here and preferred mode of delivery ie TOLAC vs c section. Pt was informed of process   6. Unwanted fertility Paper signed  Preterm labor symptoms and general obstetric precautions including but not limited to vaginal bleeding, contractions, leaking of fluid and fetal movement were reviewed in detail with the patient. Please refer to After Visit Summary for other counseling recommendations.  Return in about 2 weeks (around 09/18/2018) for OB visit, televisit.   Chancy Milroy, MD

## 2018-09-04 NOTE — Patient Instructions (Signed)
Third Trimester of Pregnancy The third trimester is from week 28 through week 40 (months 7 through 9). The third trimester is a time when the unborn baby (fetus) is growing rapidly. At the end of the ninth month, the fetus is about 20 inches in length and weighs 6-10 pounds. Body changes during your third trimester Your body will continue to go through many changes during pregnancy. The changes vary from woman to woman. During the third trimester:  Your weight will continue to increase. You can expect to gain 25-35 pounds (11-16 kg) by the end of the pregnancy.  You may begin to get stretch marks on your hips, abdomen, and breasts.  You may urinate more often because the fetus is moving lower into your pelvis and pressing on your bladder.  You may develop or continue to have heartburn. This is caused by increased hormones that slow down muscles in the digestive tract.  You may develop or continue to have constipation because increased hormones slow digestion and cause the muscles that push waste through your intestines to relax.  You may develop hemorrhoids. These are swollen veins (varicose veins) in the rectum that can itch or be painful.  You may develop swollen, bulging veins (varicose veins) in your legs.  You may have increased body aches in the pelvis, back, or thighs. This is due to weight gain and increased hormones that are relaxing your joints.  You may have changes in your hair. These can include thickening of your hair, rapid growth, and changes in texture. Some women also have hair loss during or after pregnancy, or hair that feels dry or thin. Your hair will most likely return to normal after your baby is born.  Your breasts will continue to grow and they will continue to become tender. A yellow fluid (colostrum) may leak from your breasts. This is the first milk you are producing for your baby.  Your belly button may stick out.  You may notice more swelling in your hands,  face, or ankles.  You may have increased tingling or numbness in your hands, arms, and legs. The skin on your belly may also feel numb.  You may feel short of breath because of your expanding uterus.  You may have more problems sleeping. This can be caused by the size of your belly, increased need to urinate, and an increase in your body's metabolism.  You may notice the fetus "dropping," or moving lower in your abdomen (lightening).  You may have increased vaginal discharge.  You may notice your joints feel loose and you may have pain around your pelvic bone. What to expect at prenatal visits You will have prenatal exams every 2 weeks until week 36. Then you will have weekly prenatal exams. During a routine prenatal visit:  You will be weighed to make sure you and the baby are growing normally.  Your blood pressure will be taken.  Your abdomen will be measured to track your baby's growth.  The fetal heartbeat will be listened to.  Any test results from the previous visit will be discussed.  You may have a cervical check near your due date to see if your cervix has softened or thinned (effaced).  You will be tested for Group B streptococcus. This happens between 35 and 37 weeks. Your health care provider may ask you:  What your birth plan is.  How you are feeling.  If you are feeling the baby move.  If you have had any abnormal  symptoms, such as leaking fluid, bleeding, severe headaches, or abdominal cramping.  If you are using any tobacco products, including cigarettes, chewing tobacco, and electronic cigarettes.  If you have any questions. Other tests or screenings that may be performed during your third trimester include:  Blood tests that check for low iron levels (anemia).  Fetal testing to check the health, activity level, and growth of the fetus. Testing is done if you have certain medical conditions or if there are problems during the pregnancy.  Nonstress test  (NST). This test checks the health of your baby to make sure there are no signs of problems, such as the baby not getting enough oxygen. During this test, a belt is placed around your belly. The baby is made to move, and its heart rate is monitored during movement. What is false labor? False labor is a condition in which you feel small, irregular tightenings of the muscles in the womb (contractions) that usually go away with rest, changing position, or drinking water. These are called Braxton Hicks contractions. Contractions may last for hours, days, or even weeks before true labor sets in. If contractions come at regular intervals, become more frequent, increase in intensity, or become painful, you should see your health care provider. What are the signs of labor?  Abdominal cramps.  Regular contractions that start at 10 minutes apart and become stronger and more frequent with time.  Contractions that start on the top of the uterus and spread down to the lower abdomen and back.  Increased pelvic pressure and dull back pain.  A watery or bloody mucus discharge that comes from the vagina.  Leaking of amniotic fluid. This is also known as your "water breaking." It could be a slow trickle or a gush. Let your health care provider know if it has a color or strange odor. If you have any of these signs, call your health care provider right away, even if it is before your due date. Follow these instructions at home: Medicines  Follow your health care provider's instructions regarding medicine use. Specific medicines may be either safe or unsafe to take during pregnancy.  Take a prenatal vitamin that contains at least 600 micrograms (mcg) of folic acid.  If you develop constipation, try taking a stool softener if your health care provider approves. Eating and drinking   Eat a balanced diet that includes fresh fruits and vegetables, whole grains, good sources of protein such as meat, eggs, or tofu,  and low-fat dairy. Your health care provider will help you determine the amount of weight gain that is right for you.  Avoid raw meat and uncooked cheese. These carry germs that can cause birth defects in the baby.  If you have low calcium intake from food, talk to your health care provider about whether you should take a daily calcium supplement.  Eat four or five small meals rather than three large meals a day.  Limit foods that are high in fat and processed sugars, such as fried and sweet foods.  To prevent constipation: ? Drink enough fluid to keep your urine clear or pale yellow. ? Eat foods that are high in fiber, such as fresh fruits and vegetables, whole grains, and beans. Activity  Exercise only as directed by your health care provider. Most women can continue their usual exercise routine during pregnancy. Try to exercise for 30 minutes at least 5 days a week. Stop exercising if you experience uterine contractions.  Avoid heavy lifting.  Do  not exercise in extreme heat or humidity, or at high altitudes.  Wear low-heel, comfortable shoes.  Practice good posture.  You may continue to have sex unless your health care provider tells you otherwise. Relieving pain and discomfort  Take frequent breaks and rest with your legs elevated if you have leg cramps or low back pain.  Take warm sitz baths to soothe any pain or discomfort caused by hemorrhoids. Use hemorrhoid cream if your health care provider approves.  Wear a good support bra to prevent discomfort from breast tenderness.  If you develop varicose veins: ? Wear support pantyhose or compression stockings as told by your healthcare provider. ? Elevate your feet for 15 minutes, 3-4 times a day. Prenatal care  Write down your questions. Take them to your prenatal visits.  Keep all your prenatal visits as told by your health care provider. This is important. Safety  Wear your seat belt at all times when driving.  Make  a list of emergency phone numbers, including numbers for family, friends, the hospital, and police and fire departments. General instructions  Avoid cat litter boxes and soil used by cats. These carry germs that can cause birth defects in the baby. If you have a cat, ask someone to clean the litter box for you.  Do not travel far distances unless it is absolutely necessary and only with the approval of your health care provider.  Do not use hot tubs, steam rooms, or saunas.  Do not drink alcohol.  Do not use any products that contain nicotine or tobacco, such as cigarettes and e-cigarettes. If you need help quitting, ask your health care provider.  Do not use any medicinal herbs or unprescribed drugs. These chemicals affect the formation and growth of the baby.  Do not douche or use tampons or scented sanitary pads.  Do not cross your legs for long periods of time.  To prepare for the arrival of your baby: ? Take prenatal classes to understand, practice, and ask questions about labor and delivery. ? Make a trial run to the hospital. ? Visit the hospital and tour the maternity area. ? Arrange for maternity or paternity leave through employers. ? Arrange for family and friends to take care of pets while you are in the hospital. ? Purchase a rear-facing car seat and make sure you know how to install it in your car. ? Pack your hospital bag. ? Prepare the baby's nursery. Make sure to remove all pillows and stuffed animals from the baby's crib to prevent suffocation.  Visit your dentist if you have not gone during your pregnancy. Use a soft toothbrush to brush your teeth and be gentle when you floss. Contact a health care provider if:  You are unsure if you are in labor or if your water has broken.  You become dizzy.  You have mild pelvic cramps, pelvic pressure, or nagging pain in your abdominal area.  You have lower back pain.  You have persistent nausea, vomiting, or diarrhea.   You have an unusual or bad smelling vaginal discharge.  You have pain when you urinate. Get help right away if:  Your water breaks before 37 weeks.  You have regular contractions less than 5 minutes apart before 37 weeks.  You have a fever.  You are leaking fluid from your vagina.  You have spotting or bleeding from your vagina.  You have severe abdominal pain or cramping.  You have rapid weight loss or weight gain.  You have  shortness of breath with chest pain.  You notice sudden or extreme swelling of your face, hands, ankles, feet, or legs.  Your baby makes fewer than 10 movements in 2 hours.  You have severe headaches that do not go away when you take medicine.  You have vision changes. Summary  The third trimester is from week 28 through week 40, months 7 through 9. The third trimester is a time when the unborn baby (fetus) is growing rapidly.  During the third trimester, your discomfort may increase as you and your baby continue to gain weight. You may have abdominal, leg, and back pain, sleeping problems, and an increased need to urinate.  During the third trimester your breasts will keep growing and they will continue to become tender. A yellow fluid (colostrum) may leak from your breasts. This is the first milk you are producing for your baby.  False labor is a condition in which you feel small, irregular tightenings of the muscles in the womb (contractions) that eventually go away. These are called Braxton Hicks contractions. Contractions may last for hours, days, or even weeks before true labor sets in.  Signs of labor can include: abdominal cramps; regular contractions that start at 10 minutes apart and become stronger and more frequent with time; watery or bloody mucus discharge that comes from the vagina; increased pelvic pressure and dull back pain; and leaking of amniotic fluid. This information is not intended to replace advice given to you by your health  care provider. Make sure you discuss any questions you have with your health care provider. Document Released: 04/24/2001 Document Revised: 06/05/2016 Document Reviewed: 06/05/2016 Elsevier Interactive Patient Education  2019 [employer].

## 2018-09-05 ENCOUNTER — Telehealth: Payer: Self-pay | Admitting: *Deleted

## 2018-09-05 NOTE — Progress Notes (Signed)
Medical records reviewed by Dr Jerilynn Mages. Foster.  After his review, feels pt would be better served if transferred and deliveries at [facility].  Pt will be informed of transfer and we will begin transfer process.

## 2018-09-05 NOTE — Telephone Encounter (Signed)
Spoke with pt today about plan of care per Dr Rip Harbour. Pt made aware that anesthesia recommends transferring pt to [facility]. Pt made aware of referral and advised to keep all upcoming appts here until change in care occurs.  Pt states understanding.

## 2018-09-06 LAB — OBSTETRIC PANEL, INCLUDING HIV
Antibody Screen: NEGATIVE
Basophils Absolute: 0 10*3/uL (ref 0.0–0.2)
Basos: 0 %
EOS (ABSOLUTE): 0.1 10*3/uL (ref 0.0–0.4)
Eos: 1 %
HIV Screen 4th Generation wRfx: NONREACTIVE
Hematocrit: 33.4 % — ABNORMAL LOW (ref 34.0–46.6)
Hemoglobin: 11.3 g/dL (ref 11.1–15.9)
Hepatitis B Surface Ag: NEGATIVE
Immature Grans (Abs): 0 10*3/uL (ref 0.0–0.1)
Immature Granulocytes: 1 %
Lymphocytes Absolute: 2.6 10*3/uL (ref 0.7–3.1)
Lymphs: 31 %
MCH: 29.1 pg (ref 26.6–33.0)
MCHC: 33.8 g/dL (ref 31.5–35.7)
MCV: 86 fL (ref 79–97)
Monocytes Absolute: 0.5 10*3/uL (ref 0.1–0.9)
Monocytes: 6 %
Neutrophils Absolute: 5.2 10*3/uL (ref 1.4–7.0)
Neutrophils: 61 %
Platelets: 372 10*3/uL (ref 150–450)
RBC: 3.88 x10E6/uL (ref 3.77–5.28)
RDW: 16.4 % — ABNORMAL HIGH (ref 11.7–15.4)
RPR Ser Ql: NONREACTIVE
Rh Factor: POSITIVE
Rubella Antibodies, IGG: <0.9 index — ABNORMAL LOW (ref >0.99)
WBC: 8.4 10*3/uL (ref 3.4–10.8)

## 2018-09-06 LAB — HEMOGLOBIN A1C
Est. average glucose Bld gHb Est-mCnc: 105 mg/dL
Hgb A1c MFr Bld: 5.3 % (ref 4.8–5.6)

## 2018-09-08 ENCOUNTER — Inpatient Hospital Stay: Payer: Medicaid Other | Admitting: Infectious Disease

## 2018-09-11 ENCOUNTER — Other Ambulatory Visit: Payer: Self-pay

## 2018-09-11 ENCOUNTER — Other Ambulatory Visit: Payer: Medicaid Other

## 2018-09-11 ENCOUNTER — Other Ambulatory Visit: Payer: Self-pay | Admitting: Obstetrics and Gynecology

## 2018-09-11 DIAGNOSIS — O09893 Supervision of other high risk pregnancies, third trimester: Secondary | ICD-10-CM

## 2018-09-11 MED ORDER — PRENATAL MULTIVITAMIN CH: 1.0000 | ORAL_TABLET | Freq: Every day | ORAL | 11 refills | Status: DC

## 2018-09-11 MED ORDER — PROMETHAZINE HCL 12.5 MG PO TABS: 12.5000 mg | ORAL_TABLET | Freq: Four times a day (QID) | ORAL | 0 refills | Status: DC | PRN

## 2018-09-11 MED ORDER — PREPLUS 27-1 MG PO TABS: 1.0000 | ORAL_TABLET | Freq: Every day | ORAL | 13 refills | Status: DC

## 2018-09-11 NOTE — Addendum Note (Signed)
Addended byCleotilde Neer on: 09/11/2018 02:47 PM   Modules accepted: Orders

## 2018-09-11 NOTE — Progress Notes (Signed)
Pt requesting refill on Rx ok per provider Also ok per provider for pt to take 2 Phenergan  tabs PO. Rx sent

## 2018-09-11 NOTE — Progress Notes (Signed)
Sent prenatals to pharmacy.

## 2018-09-12 ENCOUNTER — Ambulatory Visit (INDEPENDENT_AMBULATORY_CARE_PROVIDER_SITE_OTHER): Payer: Medicaid Other | Admitting: Obstetrics & Gynecology

## 2018-09-12 ENCOUNTER — Encounter: Payer: Self-pay | Admitting: Obstetrics & Gynecology

## 2018-09-12 DIAGNOSIS — O10913 Unspecified pre-existing hypertension complicating pregnancy, third trimester: Secondary | ICD-10-CM

## 2018-09-12 DIAGNOSIS — B9561 Methicillin susceptible Staphylococcus aureus infection as the cause of diseases classified elsewhere: Secondary | ICD-10-CM

## 2018-09-12 DIAGNOSIS — O10919 Unspecified pre-existing hypertension complicating pregnancy, unspecified trimester: Secondary | ICD-10-CM

## 2018-09-12 DIAGNOSIS — O09893 Supervision of other high risk pregnancies, third trimester: Secondary | ICD-10-CM

## 2018-09-12 DIAGNOSIS — Z3A32 32 weeks gestation of pregnancy: Secondary | ICD-10-CM

## 2018-09-12 DIAGNOSIS — I330 Acute and subacute infective endocarditis: Secondary | ICD-10-CM

## 2018-09-12 LAB — GLUCOSE TOLERANCE, 1 HOUR: Glucose, 1Hr PP: 92 mg/dL (ref 65–199)

## 2018-09-12 NOTE — Progress Notes (Signed)
TELEHEALTH VIRTUAL OBSTETRICS VISIT ENCOUNTER NOTE  I connected with Kelly [name] on 09/12/18 at 10:30 AM EDT by telephone at home and verified that I am speaking with the correct person using two identifiers.   I discussed the limitations, risks, security and privacy concerns of performing an evaluation and management service by telephone and the availability of in person appointments. I also discussed with the patient that there may be a patient responsible charge related to this service. The patient expressed understanding and agreed to proceed.  Subjective:  Kelly [name] is a 33 y.o. [id number] at 36w6dbeing followed for ongoing prenatal care.  She is currently monitored for the following issues for this high-risk pregnancy and has COLONIC POLYPS; CROHN'S DISEASE; Thoracic back pain; Opioid use disorder, severe, dependence ([place]; Unspecified episodic mood disorder; ADHD (attention deficit hyperactivity disorder); Cocaine abuse ([place]; Abscess of right breast; Polysubstance abuse ([place]; Chronic hypertension affecting pregnancy; History of cesarean delivery affecting pregnancy; Foreign body granuloma of soft tissue, NEC, right upper arm; Major depressive disorder, recurrent episode with anxious distress ([place]; Bipolar disorder ([place]; Difficult intravenous access; IVDU (intravenous drug user); 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); Multifocal pneumonia; Degenerative arthritis of lumbar spine; History of bacterial endocarditis; Severe tricuspid regurgitation by prior echocardiogram; Bacteremia due to Enterococcus; Influenza B; Rubella non-immune status, antepartum; Supervision of other high risk pregnancies, third trimester; Preexisting hypertension complicating pregnancy, antepartum; Dental caries; Phobia of dental procedure; Endocarditis; History of VBAC; Infection due to Streptococcus mitis group; Acute bilateral low back pain; Hematoma; PICC (peripherally  inserted central catheter) flush; Chronic systolic heart failure ([place]; and Unwanted fertility on their problem list.  Patient reports no complaints. Reports fetal movement. Denies any contractions, bleeding or leaking of fluid.   The following portions of the patient's history were reviewed and updated as appropriate: allergies, current medications, past family history, past medical history, past social history, past surgical history and problem list.   Objective:   General:  Alert, oriented and cooperative.   Mental Status: Normal mood and affect perceived. Normal judgment and thought content.  Rest of physical exam deferred due to type of encounter  Assessment and Plan:  Pregnancy: [id number] 333w6d. Supervision of other high risk pregnancies, third trimester [facility]  2. Chronic hypertension affecting pregnancy Will record BP at home  3. 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  I discussed the assessment and treatment plan with the patient. The patient was provided an opportunity to ask questions and all were answered. The patient agreed with the plan and demonstrated an understanding of the instructions. The patient was advised to call back or seek an in-person office evaluation/[facility] any urgent or concerning symptoms. Please refer to After Visit Summary for other counseling recommendations.   I provided 10 minutes of non-face-to-face time during this encounter.  Return in about 2 weeks (around 09/26/2018) for webex. will transfer care after that  Future Appointments  Date Time Provider DeSan [place]5/11/2018 10:45 AM DaSloan LeiterMD [facility] None   10/02/2018  2:00 PM [place]FC-US  10/02/2018  2:00 PM WHPosey[place]orea WH-MFCUS MFC-US  10/09/2018  2:00 PM WHBanksHTamoraFC-US  10/09/2018  2:00  PM WH-MFC [place] 3 WH-MFCUS MFC-US    Emeterio Reeve, [place] for [place], [place]

## 2018-09-12 NOTE — Progress Notes (Signed)
Webex ROB:  Pt does not have batteries for BP cuff.  No concerns per pt.

## 2018-09-12 NOTE — Patient Instructions (Signed)
Third Trimester of Pregnancy  The third trimester is from week 28 through week 40 (months 7 through 9). This trimester is when your unborn baby (fetus) is growing very fast. At the end of the ninth month, the unborn baby is about 20 inches in length. It weighs about 6-10 pounds. Follow these instructions at home: Medicines  Take over-the-counter and prescription medicines only as told by your doctor. Some medicines are safe and some medicines are not safe during pregnancy.  Take a prenatal vitamin that contains at least 600 micrograms (mcg) of folic acid.  If you have trouble pooping (constipation), take medicine that will make your stool soft (stool softener) if your doctor approves. Eating and drinking   Eat regular, healthy meals.  Avoid raw meat and uncooked cheese.  If you get low calcium from the food you eat, talk to your doctor about taking a daily calcium supplement.  Eat four or five small meals rather than three large meals a day.  Avoid foods that are high in fat and sugars, such as fried and sweet foods.  To prevent constipation: ? Eat foods that are high in fiber, like fresh fruits and vegetables, whole grains, and beans. ? Drink enough fluids to keep your pee (urine) clear or pale yellow. Activity  Exercise only as told by your doctor. Stop exercising if you start to have cramps.  Avoid heavy lifting, wear low heels, and sit up straight.  Do not exercise if it is too hot, too humid, or if you are in a place of great height (high altitude).  You may continue to have sex unless your doctor tells you not to. Relieving pain and discomfort  Wear a good support bra if your breasts are tender.  Take frequent breaks and rest with your legs raised if you have leg cramps or low back pain.  Take warm water baths (sitz baths) to soothe pain or discomfort caused by hemorrhoids. Use hemorrhoid cream if your doctor approves.  If you develop puffy, bulging veins (varicose  veins) in your legs: ? Wear support hose or compression stockings as told by your doctor. ? Raise (elevate) your feet for 15 minutes, 3-4 times a day. ? Limit salt in your food. Safety  Wear your seat belt when driving.  Make a list of emergency phone numbers, including numbers for family, friends, the hospital, and police and fire departments. Preparing for your baby's arrival To prepare for the arrival of your baby:  Take prenatal classes.  Practice driving to the hospital.  Visit the hospital and tour the maternity area.  Talk to your work about taking leave once the baby comes.  Pack your hospital bag.  Prepare the baby's room.  Go to your doctor visits.  Buy a rear-facing car seat. Learn how to install it in your car. General instructions  Do not use hot tubs, steam rooms, or saunas.  Do not use any products that contain nicotine or tobacco, such as cigarettes and e-cigarettes. If you need help quitting, ask your doctor.  Do not drink alcohol.  Do not douche or use tampons or scented sanitary pads.  Do not cross your legs for long periods of time.  Do not travel for long distances unless you must. Only do so if your doctor says it is okay.  Visit your dentist if you have not gone during your pregnancy. Use a soft toothbrush to brush your teeth. Be gentle when you floss.  Avoid cat litter boxes and soil  used by cats. These carry germs that can cause birth defects in the baby and can cause a loss of your baby (miscarriage) or stillbirth.  Keep all your prenatal visits as told by your doctor. This is important. Contact a doctor if:  You are not sure if you are in labor or if your water has broken.  You are dizzy.  You have mild cramps or pressure in your lower belly.  You have a nagging pain in your belly area.  You continue to feel sick to your stomach, you throw up, or you have watery poop.  You have bad smelling fluid coming from your vagina.  You have  pain when you pee. Get help right away if:  You have a fever.  You are leaking fluid from your vagina.  You are spotting or bleeding from your vagina.  You have severe belly cramps or pain.  You lose or gain weight quickly.  You have trouble catching your breath and have chest pain.  You notice sudden or extreme puffiness (swelling) of your face, hands, ankles, feet, or legs.  You have not felt the baby move in over an hour.  You have severe headaches that do not go away with medicine.  You have trouble seeing.  You are leaking, or you are having a gush of fluid, from your vagina before you are 37 weeks.  You have regular belly spasms (contractions) before you are 37 weeks. Summary  The third trimester is from week 28 through week 40 (months 7 through 9). This time is when your unborn baby is growing very fast.  Follow your doctor's advice about medicine, food, and activity.  Get ready for the arrival of your baby by taking prenatal classes, getting all the baby items ready, preparing the baby's room, and visiting your doctor to be checked.  Get help right away if you are bleeding from your vagina, or you have chest pain and trouble catching your breath, or if you have not felt your baby move in over an hour. This information is not intended to replace advice given to you by your health care provider. Make sure you discuss any questions you have with your health care provider. Document Released: 07/25/2009 Document Revised: 06/05/2016 Document Reviewed: 06/05/2016 Elsevier Interactive Patient Education  2019 [employer].

## 2018-09-18 ENCOUNTER — Encounter: Payer: Self-pay | Admitting: Obstetrics and Gynecology

## 2018-09-18 ENCOUNTER — Ambulatory Visit (INDEPENDENT_AMBULATORY_CARE_PROVIDER_SITE_OTHER): Payer: Medicaid Other | Admitting: Obstetrics and Gynecology

## 2018-09-18 ENCOUNTER — Other Ambulatory Visit: Payer: Self-pay

## 2018-09-18 DIAGNOSIS — F1910 Other psychoactive substance abuse, uncomplicated: Secondary | ICD-10-CM | POA: Diagnosis not present

## 2018-09-18 DIAGNOSIS — Z98891 History of uterine scar from previous surgery: Secondary | ICD-10-CM

## 2018-09-18 DIAGNOSIS — Z283 Underimmunization status: Secondary | ICD-10-CM

## 2018-09-18 DIAGNOSIS — I5022 Chronic systolic (congestive) heart failure: Secondary | ICD-10-CM

## 2018-09-18 DIAGNOSIS — B9561 Methicillin susceptible Staphylococcus aureus infection as the cause of diseases classified elsewhere: Secondary | ICD-10-CM

## 2018-09-18 DIAGNOSIS — Z3009 Encounter for other general counseling and advice on contraception: Secondary | ICD-10-CM

## 2018-09-18 DIAGNOSIS — O34219 Maternal care for unspecified type scar from previous cesarean delivery: Secondary | ICD-10-CM

## 2018-09-18 DIAGNOSIS — O09893 Supervision of other high risk pregnancies, third trimester: Secondary | ICD-10-CM | POA: Diagnosis not present

## 2018-09-18 DIAGNOSIS — Z8679 Personal history of other diseases of the circulatory system: Secondary | ICD-10-CM

## 2018-09-18 DIAGNOSIS — O10919 Unspecified pre-existing hypertension complicating pregnancy, unspecified trimester: Secondary | ICD-10-CM

## 2018-09-18 DIAGNOSIS — O10913 Unspecified pre-existing hypertension complicating pregnancy, third trimester: Secondary | ICD-10-CM

## 2018-09-18 DIAGNOSIS — I330 Acute and subacute infective endocarditis: Secondary | ICD-10-CM

## 2018-09-18 DIAGNOSIS — O9989 Other specified diseases and conditions complicating pregnancy, childbirth and the puerperium: Secondary | ICD-10-CM

## 2018-09-18 DIAGNOSIS — F1990 Other psychoactive substance use, unspecified, uncomplicated: Secondary | ICD-10-CM | POA: Diagnosis not present

## 2018-09-18 DIAGNOSIS — Z2839 Other underimmunization status: Secondary | ICD-10-CM

## 2018-09-18 DIAGNOSIS — Z3A33 33 weeks gestation of pregnancy: Secondary | ICD-10-CM

## 2018-09-18 MED ORDER — PANTOPRAZOLE SODIUM 40 MG PO TBEC: 40.0000 mg | DELAYED_RELEASE_TABLET | Freq: Every day | ORAL | 2 refills | Status: DC

## 2018-09-18 MED ORDER — MAGNESIUM OXIDE 400 (241.3 MG) MG PO TABS: 400.0000 mg | ORAL_TABLET | Freq: Every day | ORAL | 1 refills | Status: DC

## 2018-09-18 MED ORDER — DOCUSATE SODIUM 100 MG PO CAPS: 100.0000 mg | ORAL_CAPSULE | Freq: Two times a day (BID) | ORAL | 2 refills | Status: DC | PRN

## 2018-09-18 NOTE — Progress Notes (Signed)
Pt states she is out of Magnesium oxide and Protonix wants refills.  *B/P:131/91  *P:87

## 2018-09-18 NOTE — Progress Notes (Signed)
TELEHEALTH VIRTUAL OBSTETRICS PRENATAL VISIT ENCOUNTER NOTE  I connected with Kelly [name] on 09/18/18 at 10:45 AM EDT by WebEx at home and verified that I am speaking with the correct person using two identifiers.   I discussed the limitations, risks, security and privacy concerns of performing an evaluation and management service by telephone and the availability of in person appointments. I also discussed with the patient that there may be a patient responsible charge related to this service. The patient expressed understanding and agreed to proceed. Subjective:  Kelly [name] is a 33 y.o. [id number] at 57w5dbeing seen today for ongoing prenatal care.  She is currently monitored for the following issues for this high-risk pregnancy and has COLONIC POLYPS; CROHN'S DISEASE; Thoracic back pain; Opioid use disorder, severe, dependence ([place]; Unspecified episodic mood disorder; ADHD (attention deficit hyperactivity disorder); Cocaine abuse ([place]; Abscess of right breast; Polysubstance abuse (HB[place]; Chronic hypertension affecting pregnancy; History of cesarean delivery affecting pregnancy; Foreign body granuloma of soft tissue, NEC, right upper arm; Major depressive disorder, recurrent episode with anxious distress (HW[place]; Bipolar disorder (HF[place]; Difficult intravenous access; IVDU (intravenous drug user); 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); Multifocal pneumonia; Degenerative arthritis of lumbar spine; History of bacterial endocarditis; Severe tricuspid regurgitation by prior echocardiogram; Bacteremia due to Enterococcus; Influenza B; Rubella non-immune status, antepartum; Supervision of other high risk pregnancies, third trimester; Preexisting hypertension complicating pregnancy, antepartum; Dental caries; Phobia of dental procedure; Endocarditis; History of VBAC; Infection due to Streptococcus mitis group; Acute bilateral low back pain; Hematoma; PICC  (peripherally inserted central catheter) flush; Chronic systolic heart failure (H[place]; and Unwanted fertility on their problem list.  Patient reports constipation.  Reports fetal movement. Contractions: Not present. Vag. Bleeding: None.  Movement: Present. Denies any contractions, bleeding or leaking of fluid.   The following portions of the patient's history were reviewed and updated as appropriate: allergies, current medications, past family history, past medical history, past social history, past surgical history and problem list.   Objective:  There were no vitals filed for this visit.  Fetal Status:     Movement: Present     General:  Alert, oriented and cooperative. Patient is in no acute distress.  Respiratory: Normal respiratory effort, no problems with respiration noted  Mental Status: Normal mood and affect. Normal behavior. Normal judgment and thought content.  Rest of physical exam deferred due to type of encounter  Assessment and Plan:  Pregnancy: [id number]. Supervision of other high risk pregnancies, third trimester Complains of constipation  2. History of VBAC Would like to VBAC  3. Unwanted fertility For BTL  4. History of bacterial endocarditis S/p 6 weeks vanc in pregnancy  5. IVDU (intravenous drug user) On methadone  6. History of cesarean delivery affecting pregnancy  7. Polysubstance abuse (HCNorth Vernon 8. Chronic hypertension affecting pregnancy  9. Chronic systolic heart failure (HCC) L ventricular dysfunction 30-35% Cardiology and Anesthesia [facility] has appt set up for [facility]/20  10. Rubella non-immune status, antepartum MMR pp  1137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)  [facility]. [facility], she verbalizes understanding and is agreeable.  Preterm labor symptoms  and general obstetric precautions including but not limited to vaginal bleeding, contractions, leaking of fluid and fetal movement were reviewed in detail with the patient. I discussed the assessment and  treatment plan with the patient. The patient was provided an opportunity to ask questions and all were answered. The patient agreed with the plan and demonstrated an understanding of the instructions. The patient was advised to call back or seek an in-person office evaluation/[facility] for any urgent or concerning symptoms. Please refer to After Visit Summary for other counseling recommendations.   I provided 18 minutes of face-to-face via WebEx time during this encounter.  No follow-ups on file.  Future Appointments  Date Time Provider [place]  10/02/2018  2:00 PM [place] [facility]  10/02/2018  2:00 PM WH-MFC [place] 3 WH-MFCUS MFC-US  10/09/2018  2:00 PM [place] NURSE [place] MFC-US  10/09/2018  2:00 PM WH-MFC [place] 3 WH-MFCUS MFC-US    Dakotah Orrego M [place], [place] for [place], [place]

## 2018-09-24 ENCOUNTER — Ambulatory Visit: Payer: Medicaid Other | Admitting: Physical Therapy

## 2018-09-29 ENCOUNTER — Other Ambulatory Visit (HOSPITAL_COMMUNITY): Payer: Self-pay | Admitting: Obstetrics and Gynecology

## 2018-09-29 DIAGNOSIS — O10913 Unspecified pre-existing hypertension complicating pregnancy, third trimester: Secondary | ICD-10-CM

## 2018-09-30 ENCOUNTER — Ambulatory Visit: Payer: Medicaid Other | Admitting: Physical Therapy

## 2018-10-01 ENCOUNTER — Ambulatory Visit: Payer: Medicaid Other

## 2018-10-01 DIAGNOSIS — Z8619 Personal history of other infectious and parasitic diseases: Secondary | ICD-10-CM | POA: Diagnosis present

## 2018-10-01 HISTORY — DX: Personal history of other infectious and parasitic diseases: Z86.19

## 2018-10-02 ENCOUNTER — Encounter (HOSPITAL_COMMUNITY): Payer: Self-pay

## 2018-10-02 ENCOUNTER — Ambulatory Visit (HOSPITAL_COMMUNITY): Payer: Medicaid Other | Attending: Obstetrics and Gynecology

## 2018-10-02 ENCOUNTER — Ambulatory Visit (HOSPITAL_COMMUNITY): Admission: RE | Admit: 2018-10-02 | Payer: Medicaid Other | Source: Ambulatory Visit

## 2018-10-03 ENCOUNTER — Ambulatory Visit: Payer: Medicaid Other | Admitting: Physical Therapy

## 2018-10-07 DIAGNOSIS — Q249 Congenital malformation of heart, unspecified: Secondary | ICD-10-CM | POA: Insufficient documentation

## 2018-10-09 ENCOUNTER — Ambulatory Visit (HOSPITAL_COMMUNITY): Payer: Medicaid Other

## 2018-10-09 ENCOUNTER — Ambulatory Visit (HOSPITAL_COMMUNITY): Admission: RE | Admit: 2018-10-09 | Payer: Medicaid Other | Source: Ambulatory Visit

## 2018-10-16 MED ORDER — OXYTOCIN-SODIUM CHLORIDE 30-0.9 UT/500ML-% IV SOLN
0.50 | INTRAVENOUS | Status: DC
Start: ? — End: 2018-10-16

## 2018-10-16 MED ORDER — FENTANYL-BUPIVACAINE-NACL 0.2-0.125-0.9 MG/100ML-% EP SOLN
EPIDURAL | Status: DC
Start: ? — End: 2018-10-16

## 2018-10-16 MED ORDER — DIPHENHYDRAMINE HCL 50 MG/ML IJ SOLN
25.00 | INTRAMUSCULAR | Status: DC
Start: ? — End: 2018-10-16

## 2018-10-16 MED ORDER — SODIUM CHLORIDE FLUSH 0.9 % IV SOLN
10.00 | INTRAVENOUS | Status: DC
Start: 2018-10-16 — End: 2018-10-16

## 2018-10-16 MED ORDER — GENERIC EXTERNAL MEDICATION
10.00 | Status: DC
Start: ? — End: 2018-10-16

## 2018-10-16 MED ORDER — LACTATED RINGERS IV SOLN
500.00 | INTRAVENOUS | Status: DC
Start: ? — End: 2018-10-16

## 2018-10-16 MED ORDER — LACTATED RINGERS IV SOLN
INTRAVENOUS | Status: DC
Start: ? — End: 2018-10-16

## 2018-10-16 MED ORDER — PANTOPRAZOLE SODIUM 40 MG PO TBEC
40.00 | DELAYED_RELEASE_TABLET | ORAL | Status: DC
Start: 2018-10-16 — End: 2018-10-16

## 2018-10-16 MED ORDER — NALOXONE HCL 0.4 MG/ML IJ SOLN
0.10 | INTRAMUSCULAR | Status: DC
Start: ? — End: 2018-10-16

## 2018-10-16 MED ORDER — SODIUM CHLORIDE FLUSH 0.9 % IV SOLN
10.00 | INTRAVENOUS | Status: DC
Start: ? — End: 2018-10-16

## 2018-10-16 MED ORDER — PRENATAL 27-0.8 MG PO TABS
1.00 | ORAL_TABLET | ORAL | Status: DC
Start: 2018-10-16 — End: 2018-10-16

## 2018-10-16 MED ORDER — METHADONE HCL 10 MG/ML PO CONC
115.00 | ORAL | Status: DC
Start: 2018-10-17 — End: 2018-10-16

## 2018-10-16 MED ORDER — TERBUTALINE SULFATE 1 MG/ML IJ SOLN
.25 | INTRAMUSCULAR | Status: DC
Start: ? — End: 2018-10-16

## 2018-12-12 ENCOUNTER — Telehealth (HOSPITAL_COMMUNITY): Payer: Self-pay | Admitting: Vascular Surgery

## 2018-12-12 NOTE — Telephone Encounter (Signed)
Left pt vm giving NEW CHF appt w/ db 9/8 @ 11:20

## 2019-01-15 ENCOUNTER — Emergency Department (HOSPITAL_COMMUNITY): Payer: Medicaid Other

## 2019-01-15 ENCOUNTER — Encounter (HOSPITAL_COMMUNITY): Payer: Self-pay

## 2019-01-15 ENCOUNTER — Emergency Department (HOSPITAL_COMMUNITY)
Admission: EM | Admit: 2019-01-15 | Discharge: 2019-01-15 | Disposition: A | Discharge status: Home / Self Care | Payer: Medicaid Other | Attending: Emergency Medicine | Admitting: Emergency Medicine

## 2019-01-15 DIAGNOSIS — M25551 Pain in right hip: Secondary | ICD-10-CM | POA: Insufficient documentation

## 2019-01-15 DIAGNOSIS — R300 Dysuria: Secondary | ICD-10-CM | POA: Insufficient documentation

## 2019-01-15 DIAGNOSIS — F319 Bipolar disorder, unspecified: Secondary | ICD-10-CM | POA: Insufficient documentation

## 2019-01-15 DIAGNOSIS — G8911 Acute pain due to trauma: Secondary | ICD-10-CM | POA: Insufficient documentation

## 2019-01-15 DIAGNOSIS — M25552 Pain in left hip: Secondary | ICD-10-CM | POA: Insufficient documentation

## 2019-01-15 DIAGNOSIS — R911 Solitary pulmonary nodule: Secondary | ICD-10-CM | POA: Diagnosis not present

## 2019-01-15 DIAGNOSIS — I10 Essential (primary) hypertension: Secondary | ICD-10-CM | POA: Diagnosis not present

## 2019-01-15 DIAGNOSIS — Y92481 Parking lot as the place of occurrence of the external cause: Secondary | ICD-10-CM | POA: Insufficient documentation

## 2019-01-15 DIAGNOSIS — Z79899 Other long term (current) drug therapy: Secondary | ICD-10-CM | POA: Diagnosis not present

## 2019-01-15 DIAGNOSIS — R52 Pain, unspecified: Secondary | ICD-10-CM

## 2019-01-15 DIAGNOSIS — Z87891 Personal history of nicotine dependence: Secondary | ICD-10-CM | POA: Diagnosis not present

## 2019-01-15 LAB — URINALYSIS, ROUTINE W REFLEX MICROSCOPIC
Bilirubin Urine: NEGATIVE
Glucose, UA: NEGATIVE mg/dL
Hgb urine dipstick: NEGATIVE
Ketones, ur: NEGATIVE mg/dL
Nitrite: POSITIVE — AB
Protein, ur: NEGATIVE mg/dL
Specific Gravity, Urine: 1.026 (ref 1.005–1.030)
pH: 5 (ref 5.0–8.0)

## 2019-01-15 LAB — CBC WITH DIFFERENTIAL/PLATELET
Abs Immature Granulocytes: 0.02 10*3/uL (ref 0.00–0.07)
Basophils Absolute: 0 10*3/uL (ref 0.0–0.1)
Basophils Relative: 1 %
Eosinophils Absolute: 0.1 10*3/uL (ref 0.0–0.5)
Eosinophils Relative: 2 %
HCT: 44 % (ref 36.0–46.0)
Hemoglobin: 14.3 g/dL (ref 12.0–15.0)
Immature Granulocytes: 0 %
Lymphocytes Relative: 45 %
Lymphs Abs: 3.2 10*3/uL (ref 0.7–4.0)
MCH: 29.2 pg (ref 26.0–34.0)
MCHC: 32.5 g/dL (ref 30.0–36.0)
MCV: 90 fL (ref 80.0–100.0)
Monocytes Absolute: 0.4 10*3/uL (ref 0.1–1.0)
Monocytes Relative: 5 %
Neutro Abs: 3.4 10*3/uL (ref 1.7–7.7)
Neutrophils Relative %: 47 %
Platelets: 368 10*3/uL (ref 150–400)
RBC: 4.89 MIL/uL (ref 3.87–5.11)
RDW: 13.3 % (ref 11.5–15.5)
WBC: 7.2 10*3/uL (ref 4.0–10.5)
nRBC: 0 % (ref 0.0–0.2)

## 2019-01-15 LAB — POC URINE PREG, ED: Preg Test, Ur: NEGATIVE

## 2019-01-15 LAB — BASIC METABOLIC PANEL
Anion gap: 10 (ref 5–15)
BUN: 8 mg/dL (ref 6–20)
CO2: 27 mmol/L (ref 22–32)
Calcium: 9.8 mg/dL (ref 8.9–10.3)
Chloride: 102 mmol/L (ref 98–111)
Creatinine, Ser: 0.83 mg/dL (ref 0.44–1.00)
GFR calc Af Amer: >60 mL/min (ref >60)
GFR calc non Af Amer: >60 mL/min (ref >60)
Glucose, Bld: 96 mg/dL (ref 70–99)
Potassium: 4.3 mmol/L (ref 3.5–5.1)
Sodium: 139 mmol/L (ref 135–145)

## 2019-01-15 MED ORDER — METHOCARBAMOL 500 MG PO TABS
500.0000 mg | ORAL_TABLET | Freq: Once | ORAL | Status: AC
  Administered 2019-01-15: 18:00:00 500 mg via ORAL
  Filled 2019-01-15: qty 1

## 2019-01-15 MED ORDER — DOXYCYCLINE HYCLATE 100 MG PO CAPS: 100.0000 mg | ORAL_CAPSULE | Freq: Two times a day (BID) | ORAL | 0 refills | Status: AC

## 2019-01-15 MED ORDER — ACETAMINOPHEN 325 MG PO TABS
650.0000 mg | ORAL_TABLET | Freq: Once | ORAL | Status: AC
  Administered 2019-01-15: 650 mg via ORAL
  Filled 2019-01-15: qty 2

## 2019-01-15 MED ORDER — LIDOCAINE 5 % EX PTCH
1.0000 | MEDICATED_PATCH | CUTANEOUS | Status: DC
  Administered 2019-01-15: 18:00:00 1 via TRANSDERMAL
  Filled 2019-01-15: qty 1

## 2019-01-15 MED ORDER — AMOXICILLIN-POT CLAVULANATE 875-125 MG PO TABS: 1.0000 | ORAL_TABLET | Freq: Two times a day (BID) | ORAL | 0 refills | Status: DC

## 2019-01-15 MED ORDER — IOHEXOL 300 MG/ML  SOLN
100.0000 mL | Freq: Once | INTRAMUSCULAR | Status: AC | PRN
  Administered 2019-01-15: 100 mL via INTRAVENOUS

## 2019-01-15 MED ORDER — METHOCARBAMOL 500 MG PO TABS: 500.0000 mg | ORAL_TABLET | Freq: Two times a day (BID) | ORAL | 0 refills | Status: AC

## 2019-01-15 NOTE — ED Notes (Signed)
Iv attempted x's 2 without success
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
## 2019-01-15 NOTE — ED Notes (Signed)
Pt to xray at this time.

## 2019-01-15 NOTE — ED Notes (Signed)
Pt to CT at this time.
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
## 2019-01-15 NOTE — ED Notes (Signed)
Iv attempted x3 unsucessful, will place IV team consult.
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
## 2019-01-17 LAB — URINE CULTURE: Culture: >=100000 — AB

## 2019-01-18 ENCOUNTER — Telehealth: Payer: Self-pay

## 2019-01-18 NOTE — Telephone Encounter (Signed)
Post ED Visit - Positive Culture Follow-up  Culture report reviewed by antimicrobial stewardship pharmacist: [place] Team []  Elenor Quinones, Pharm.D. []  Heide Guile, Pharm.D., BCPS AQ-ID []  Parks Neptune, Pharm.D., BCPS []  Alycia Rossetti, Pharm.D., BCPS []  [place], Pharm.D., BCPS, AAHIVP []  Legrand Como, Pharm.D., BCPS, AAHIVP []  Salome Arnt, PharmD, BCPS []  Johnnette Gourd, PharmD, BCPS [x]  Hughes Better, PharmD, BCPS []  Leeroy Cha, PharmD []  Laqueta Linden, PharmD, BCPS []  Albertina Parr, PharmD  [place] Team []  Leodis Sias, PharmD []  Lindell Spar, PharmD []  Royetta Asal, PharmD []  Graylin Shiver, Rph []  Rema Fendt) Glennon Mac, PharmD []  Arlyn Dunning, PharmD []  Netta Cedars, PharmD []  Dia Sitter, PharmD []  Leone Haven, PharmD []  Gretta Arab, PharmD []  Theodis Shove, PharmD []  Peggyann Juba, PharmD []  Reuel Boom, PharmD   Positive urine culture Treated with Amoxicillin, organism sensitive to the same and no further patient follow-up is required at this time.  Genia Del 01/18/2019, 10:42 AM

## 2019-01-20 ENCOUNTER — Inpatient Hospital Stay (HOSPITAL_COMMUNITY)
Admission: RE | Admit: 2019-01-20 | Discharge: 2019-01-20 | Disposition: A | Discharge status: Home / Self Care | Payer: Medicaid Other | Source: Ambulatory Visit | Attending: Internal Medicine | Admitting: Internal Medicine

## 2019-02-18 ENCOUNTER — Ambulatory Visit: Payer: Medicaid Other | Admitting: Obstetrics and Gynecology

## 2019-03-04 ENCOUNTER — Telehealth (HOSPITAL_COMMUNITY): Payer: Self-pay | Admitting: Vascular Surgery

## 2019-03-04 NOTE — Telephone Encounter (Signed)
Left pt message to reschedule missed new chf pt appt w/ db

## 2019-03-05 ENCOUNTER — Ambulatory Visit: Payer: Medicaid Other | Admitting: Obstetrics and Gynecology

## 2019-03-20 ENCOUNTER — Ambulatory Visit (HOSPITAL_COMMUNITY)
Admission: RE | Admit: 2019-03-20 | Discharge: 2019-03-20 | Disposition: A | Discharge status: Home / Self Care | Payer: Medicaid Other | Source: Ambulatory Visit | Attending: Internal Medicine | Admitting: Internal Medicine

## 2019-03-20 ENCOUNTER — Encounter (HOSPITAL_COMMUNITY): Payer: Self-pay | Admitting: Internal Medicine

## 2019-03-20 ENCOUNTER — Other Ambulatory Visit: Payer: Self-pay

## 2019-03-20 VITALS — BP 110/74 | HR 66 | Wt 187.8 lb

## 2019-03-20 DIAGNOSIS — Z886 Allergy status to analgesic agent status: Secondary | ICD-10-CM | POA: Diagnosis not present

## 2019-03-20 DIAGNOSIS — F319 Bipolar disorder, unspecified: Secondary | ICD-10-CM | POA: Insufficient documentation

## 2019-03-20 DIAGNOSIS — Z8249 Family history of ischemic heart disease and other diseases of the circulatory system: Secondary | ICD-10-CM | POA: Insufficient documentation

## 2019-03-20 DIAGNOSIS — Z915 Personal history of self-harm: Secondary | ICD-10-CM | POA: Insufficient documentation

## 2019-03-20 DIAGNOSIS — I071 Rheumatic tricuspid insufficiency: Secondary | ICD-10-CM | POA: Diagnosis not present

## 2019-03-20 DIAGNOSIS — Z833 Family history of diabetes mellitus: Secondary | ICD-10-CM | POA: Insufficient documentation

## 2019-03-20 DIAGNOSIS — I519 Heart disease, unspecified: Secondary | ICD-10-CM | POA: Diagnosis not present

## 2019-03-20 DIAGNOSIS — Z885 Allergy status to narcotic agent status: Secondary | ICD-10-CM | POA: Diagnosis not present

## 2019-03-20 DIAGNOSIS — I5022 Chronic systolic (congestive) heart failure: Secondary | ICD-10-CM

## 2019-03-20 DIAGNOSIS — I330 Acute and subacute infective endocarditis: Secondary | ICD-10-CM | POA: Diagnosis not present

## 2019-03-20 DIAGNOSIS — F1921 Other psychoactive substance dependence, in remission: Secondary | ICD-10-CM | POA: Insufficient documentation

## 2019-03-20 DIAGNOSIS — Z87891 Personal history of nicotine dependence: Secondary | ICD-10-CM | POA: Diagnosis not present

## 2019-03-20 DIAGNOSIS — Z79899 Other long term (current) drug therapy: Secondary | ICD-10-CM | POA: Diagnosis not present

## 2019-03-20 DIAGNOSIS — I10 Essential (primary) hypertension: Secondary | ICD-10-CM | POA: Insufficient documentation

## 2019-03-20 DIAGNOSIS — O99320 Drug use complicating pregnancy, unspecified trimester: Secondary | ICD-10-CM | POA: Diagnosis not present

## 2019-03-20 DIAGNOSIS — B9561 Methicillin susceptible Staphylococcus aureus infection as the cause of diseases classified elsewhere: Secondary | ICD-10-CM

## 2019-03-20 DIAGNOSIS — F1910 Other psychoactive substance abuse, uncomplicated: Secondary | ICD-10-CM

## 2019-03-20 LAB — BASIC METABOLIC PANEL
Anion gap: 11 (ref 5–15)
BUN: 12 mg/dL (ref 6–20)
CO2: 23 mmol/L (ref 22–32)
Calcium: 9.7 mg/dL (ref 8.9–10.3)
Chloride: 105 mmol/L (ref 98–111)
Creatinine, Ser: 0.69 mg/dL (ref 0.44–1.00)
GFR calc Af Amer: >60 mL/min (ref >60)
GFR calc non Af Amer: >60 mL/min (ref >60)
Glucose, Bld: 82 mg/dL (ref 70–99)
Potassium: 4.5 mmol/L (ref 3.5–5.1)
Sodium: 139 mmol/L (ref 135–145)

## 2019-03-20 LAB — CBC
HCT: 45.1 % (ref 36.0–46.0)
Hemoglobin: 14.8 g/dL (ref 12.0–15.0)
MCH: 30.5 pg (ref 26.0–34.0)
MCHC: 32.8 g/dL (ref 30.0–36.0)
MCV: 93 fL (ref 80.0–100.0)
Platelets: 319 10*3/uL (ref 150–400)
RBC: 4.85 MIL/uL (ref 3.87–5.11)
RDW: 13.6 % (ref 11.5–15.5)
WBC: 8 10*3/uL (ref 4.0–10.5)
nRBC: 0 % (ref 0.0–0.2)

## 2019-03-20 LAB — BRAIN NATRIURETIC PEPTIDE: B Natriuretic Peptide: 96.8 pg/mL (ref 0.0–100.0)

## 2019-03-20 NOTE — Patient Instructions (Addendum)
Labs done today, we will notify you of abnormal results  Your physician has requested that you have an echocardiogram. Echocardiography is a painless test that uses sound waves to create images of your heart. It provides your doctor with information about the size and shape of your heart and how well your heart's chambers and valves are working. This procedure takes approximately one hour. There are no restrictions for this procedure.  WE WILL CALL YOU WITH THESE RESULTS  Your physician recommends that you schedule a follow-up appointment in: 2-3 months  If you have any questions or concerns before your next appointment please send [place] a message through [place] or call our office at [phone].  At the [facility], you and your health needs are our priority. As part of our continuing mission to provide you with exceptional heart care, we have created designated Provider Care Teams. These Care Teams include your primary Cardiologist (physician) and Advanced Practice Providers (APPs- Physician Assistants and Nurse Practitioners) who all work together to provide you with the care you need, when you need it.   You may see any of the following providers on your designated Care Team at your next follow up: Marland Kitchen Dr Glori Bickers . Dr Loralie Champagne . Darrick Grinder, NP . Lyda Jester, PA   Please be sure to bring in all your medications bottles to every appointment.

## 2019-03-20 NOTE — Progress Notes (Signed)
[facility] CONSULT NOTE  Referring Physician: Dr. Gwenlyn Found Primary Cardiologist: Dr. Gwenlyn Found  HPI:  Kelly [name] is a 33 y/o woman with h/o polysubstance abuse/IVDA referred by Dr. Gwenlyn Found for further evaluation bacterial endocarditis with severe tricuspid regurgitation and LV systolic dysfunction.   She was admitted in July 2019 with TV endocarditis and a lumbar abscess.  At that time she has severe TR and a tricuspid vegetation.  She was treated with IV antibiotics had a prolonged hospitalization and underwent laminectomy and drainage of lumbar abscess.  She was readmitted on 06/14/2018 with sepsis, pneumonia and recurrent endocarditis. Echo performed 06/16/2018 showed a large tricuspid vegetation, severe TR and normal LV function.  Her most recent echo performed 08/26/2018 revealed significant drop in LV function down to an EF of 30%.  The tricuspid vegetation had resolved however she still had severe TR. She was [redacted] weeks pregnant at that time.   Since that time she has delivered a healthy baby girl in June. She presents today saying she wants to follow up about getting her valve fixed. Denies any recent IVDA. Reports being clean since 06/10/18. Taking methadone.   Doing ok. Says she is tired. Breathing ok. Legs feel weak but walking better than she was. Can do all ADLs and care for her baby without too much problem. Mild leg edema. Not breastfeeding. Only medication is methadone. No fevers or chills.    Review of Systems: [y] = yes, [ ]  = no   General: Weight gain [ ] ; Weight loss [ ] ; Anorexia [ ] ; Fatigue [ y]; Fever [ ] ; Chills [ ] ; Weakness [ y]  Cardiac: Chest pain/pressure [ ] ; Resting SOB [ ] ; Exertional SOB [ ] ; Orthopnea [ ] ; Pedal Edema [ ] ; Palpitations [ ] ; Syncope [ ] ; Presyncope [ ] ; Paroxysmal nocturnal dyspnea[ ]   Pulmonary: Cough [ ] ; Wheezing[ ] ; Hemoptysis[ ] ; Sputum [ ] ; Snoring [ ]   GI: Vomiting[ ] ; Dysphagia[ ] ; Melena[ ] ; Hematochezia [ ] ; Heartburn[ ] ; Abdominal  pain [ ] ; Constipation [ ] ; Diarrhea [ ] ; BRBPR [ ]   GU: Hematuria[ ] ; Dysuria [ ] ; Nocturia[ ]   Vascular: Pain in legs with walking [ ] ; Pain in feet with lying flat [ ] ; Non-healing sores [ ] ; Stroke [ ] ; TIA [ ] ; Slurred speech [ ] ;  Neuro: Headaches[ ] ; Vertigo[ ] ; Seizures[ ] ; Paresthesias[ ] ;Blurred vision [ ] ; Diplopia [ ] ; Vision changes [ ]   Ortho/Skin: Arthritis [ ] ; Joint pain [ ] ; Muscle pain [ ] ; Joint swelling [ ] ; Back Pain [ y]; Rash [ ]   Psych: Depression[y ]; Anxiety[y ]  Heme: Bleeding problems [ ] ; Clotting disorders [ ] ; Anemia [ ]   Endocrine: Diabetes [ ] ; Thyroid dysfunction[ ]    Past Medical History:  Diagnosis Date  . 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 Abscess of breast    rt breast  . ADD (attention deficit disorder)   . Back pain   . Bipolar 1 disorder ([place])   . Hypertension   . Leukocytosis   . Polysubstance abuse ([place])   . Pyelonephritis affecting pregnancy 05/24/2016  . Sepsis ([place]) 12/02/2017  . Suicidal overdose ([place])     Current Outpatient Medications  Medication Sig Dispense Refill  . acetaminophen (TYLENOL) 500 MG tablet Take by mouth.    . methadone (DOLOPHINE) 10 MG/ML solution Take 115 mg by mouth daily. Pt states Methadone was increased to a liquid of 115 mg daily after hospital discharge on 3/23  No current facility-administered medications for this encounter.     Allergies  Allergen Reactions  . Hydrocodone Itching  . Morphine And Related Nausea And Vomiting      Social History   Socioeconomic History  . Marital status: Single    Spouse name: Not on file  . Number of children: Not on file  . Years of education: Not on file  . Highest education level: Not on file  Occupational History  . Not on file  Social Needs  . Financial resource strain: Not on file  . Food insecurity    Worry: Not on file    Inability: Not on file  . Transportation needs    Medical: Not on file    Non-medical: Not on  file  Tobacco Use  . Smoking status: Former Smoker    Packs/day: 1.00    Years: 13.00    Pack years: 13.00    Types: Cigarettes    Quit date: 06/13/2018    Years since quitting: 0.7  . Smokeless tobacco: Never Used  Substance and Sexual Activity  . Alcohol use: Not Currently    Comment: one or two drinks weekly  . Drug use: Yes    Types: Cocaine, Heroin, IV    Comment: heroin, crack cocaine last used 06/13/2018  . Sexual activity: Yes    Birth control/protection: Condom  Lifestyle  . Physical activity    Days per week: Not on file    Minutes per session: Not on file  . Stress: Not on file  Relationships  . Social Herbalist on phone: Not on file    Gets together: Not on file    Attends religious service: Not on file    Active member of club or organization: Not on file    Attends meetings of clubs or organizations: Not on file    Relationship status: Not on file  . Intimate partner violence    Fear of current or ex partner: Not on file    Emotionally abused: Not on file    Physically abused: Not on file    Forced sexual activity: Not on file  Other Topics Concern  . Not on file  Social History Narrative  . Not on file      Family History  Problem Relation Age of Onset  . Diabetes Mother   . Diabetes Father   . Hypertension Father   . Heart attack Neg Hx   . Hyperlipidemia Neg Hx   . Sudden death Neg Hx     Vitals:   03-28-2019 1149  BP: 110/74  Pulse: 66  SpO2: 96%  Weight: 85.2 kg (187 lb 12.8 oz)    PHYSICAL EXAM: General:  Well appearing. No respiratory difficulty HEENT: normal x poor dentition Neck: supple. no JVD. Carotids 2+ bilat; no bruits. No lymphadenopathy or thryomegaly appreciated. Cor: PMI nondisplaced. Regular rate & rhythm. TR 2/6  Lungs: clear Abdomen: soft, nontender, nondistended. No hepatosplenomegaly. No bruits or masses. Good bowel sounds. Extremities: no cyanosis, clubbing, rash, edema Neuro: alert & oriented x 3,  cranial nerves grossly intact. moves all 4 extremities w/o difficulty. Affect pleasant.  ECG: Sinus brady 57 inferolateral TWI Personally reviewed    ASSESSMENT & PLAN:  1. Severe tricuspid regurgitation - due to bacterial endocarditis in setting of IVDA. - now off IV drug use since 1/20 - no significant RV failure - will plan repeat TTE if TR still present with plan TEE and discuss with TCTS. Has  had multiple teeth pulled already but still with dental caries - use SBE prophylaxis with dental work   2. LV dysfunction - last echo with EF 30% - I did limited bedside echo in Clinic today and LVEF appears normal  3. H/o IVDA - congratulated her on abstinence - discussed need to remain vigilant.   Glori Bickers, MD  2:30 PM

## 2019-03-25 ENCOUNTER — Ambulatory Visit (HOSPITAL_COMMUNITY): Admission: RE | Admit: 2019-03-25 | Payer: Medicaid Other | Source: Ambulatory Visit

## 2019-03-26 ENCOUNTER — Ambulatory Visit (HOSPITAL_COMMUNITY): Admission: RE | Admit: 2019-03-26 | Payer: Medicaid Other | Source: Ambulatory Visit

## 2019-03-31 ENCOUNTER — Ambulatory Visit (HOSPITAL_COMMUNITY)
Admission: RE | Admit: 2019-03-31 | Discharge: 2019-03-31 | Disposition: A | Discharge status: Home / Self Care | Payer: Medicaid Other | Source: Ambulatory Visit | Attending: Internal Medicine | Admitting: Internal Medicine

## 2019-03-31 ENCOUNTER — Other Ambulatory Visit: Payer: Self-pay

## 2019-03-31 DIAGNOSIS — I110 Hypertensive heart disease with heart failure: Secondary | ICD-10-CM | POA: Diagnosis not present

## 2019-03-31 DIAGNOSIS — I340 Nonrheumatic mitral (valve) insufficiency: Secondary | ICD-10-CM | POA: Insufficient documentation

## 2019-03-31 DIAGNOSIS — I272 Pulmonary hypertension, unspecified: Secondary | ICD-10-CM | POA: Insufficient documentation

## 2019-03-31 DIAGNOSIS — I071 Rheumatic tricuspid insufficiency: Secondary | ICD-10-CM | POA: Insufficient documentation

## 2019-03-31 DIAGNOSIS — I371 Nonrheumatic pulmonary valve insufficiency: Secondary | ICD-10-CM | POA: Diagnosis not present

## 2019-03-31 DIAGNOSIS — I38 Endocarditis, valve unspecified: Secondary | ICD-10-CM | POA: Diagnosis not present

## 2019-03-31 NOTE — Progress Notes (Signed)
  Echocardiogram 2D Echocardiogram has been performed.  Felicha Frayne A Dinita Migliaccio 03/31/2019, 4:09 PM

## 2019-04-12 IMAGING — MR MR LUMBAR SPINE WO/W CM
5 of 10 series · 20 of 48 positions shown · IV contrast (MH)
Comparison: MRI of the thoracic and lumbar spine November 21, 2017.

CLINICAL DATA: Fevers, chills, endocarditis resulting and pulmonary
abscess. History of polysubstance abuse and scoliosis.

EXAM:
MRI THORACIC AND LUMBAR SPINE WITHOUT AND WITH CONTRAST
TECHNIQUE: Multiplanar and multiecho pulse sequences of the thoracic and lumbar
spine were obtained without and with intravenous contrast.
CONTRAST:  17mL MULTIHANCE GADOBENATE DIMEGLUMINE 529 MG/ML IV SOLN

[Series 18: T1 · sagittal · 3.3mm · 0.62mm/px · 1 of 9 slices shown (1 of 3)]
[im 1/9]
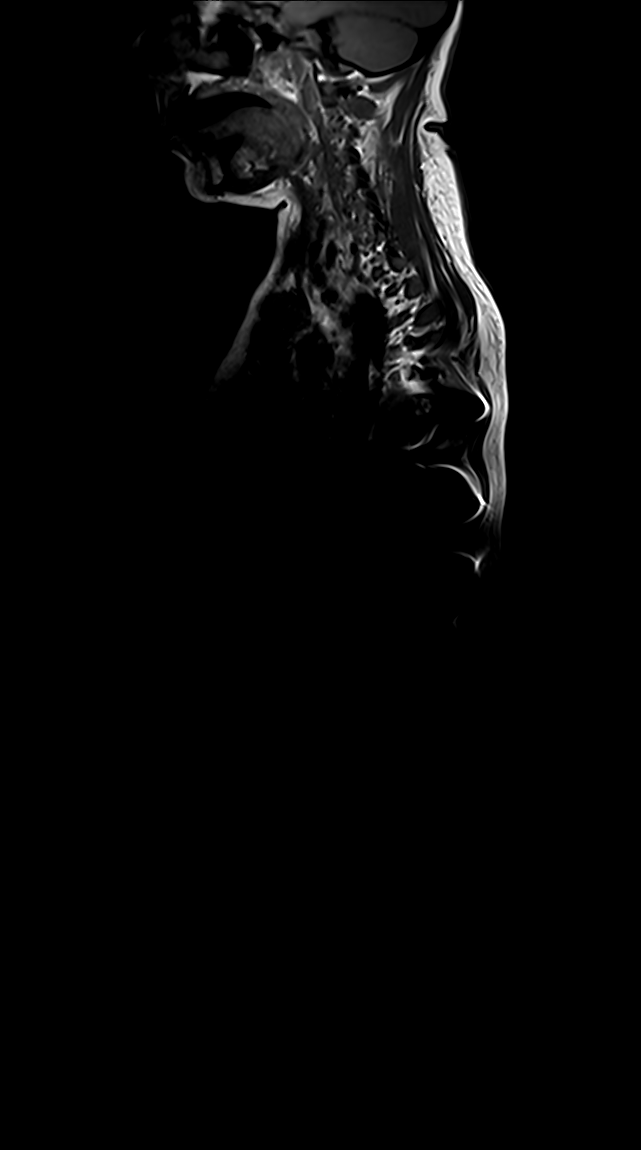

[Series 19: T2 · sagittal · 3.0mm · 0.76mm/px · 3 of 22 slices shown (1 of 2)]
[im 1/22]
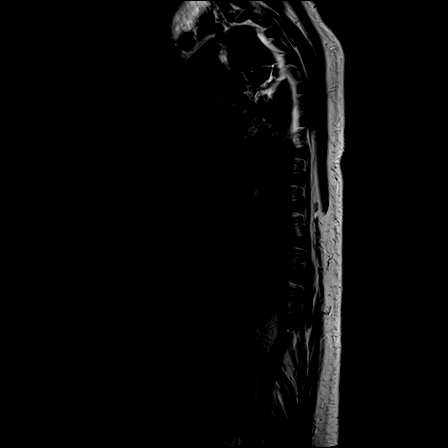
[im 11/22]
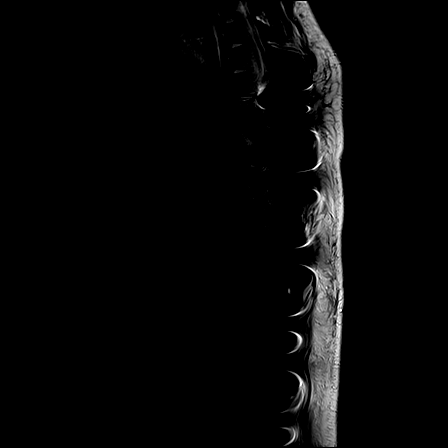
[im 22/22]
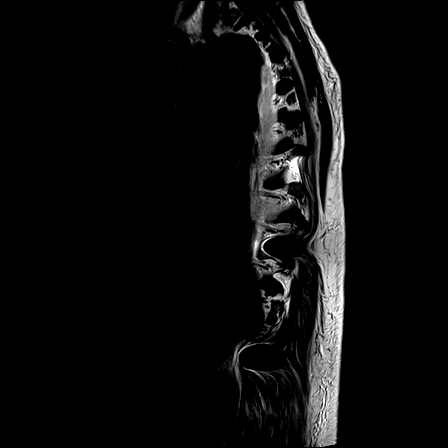

[Series 20: T1 · sagittal · 3.0mm · 0.76mm/px · 4 of 21 slices shown (2 of 3)]
[im 1/21]
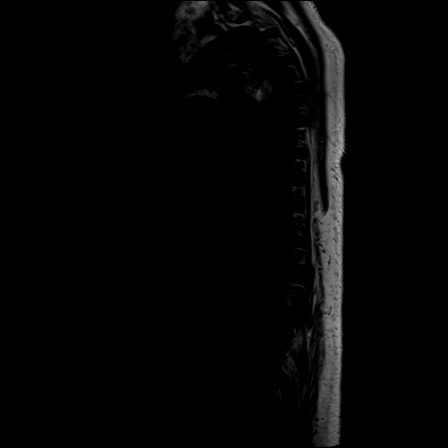
[im 7/21]
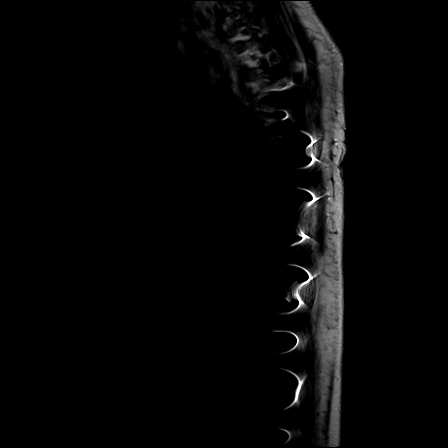
[im 14/21]
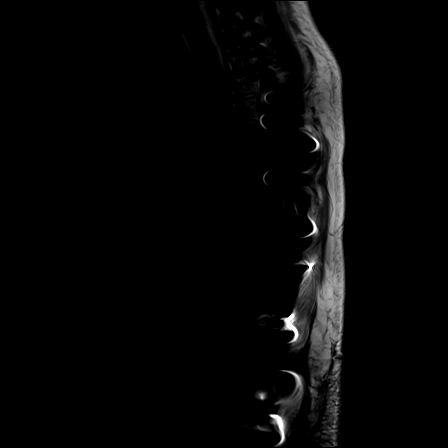
[im 21/21]
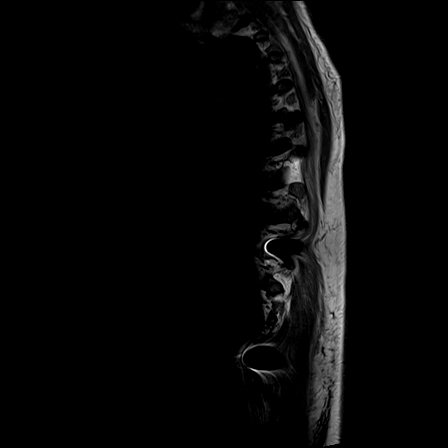

[Series 22: T2 · axial · 5.0mm · 0.59mm/px · z∈[-213,+78]mm · 7 of 40 slices shown (2 of 2)]
[im 1/40]
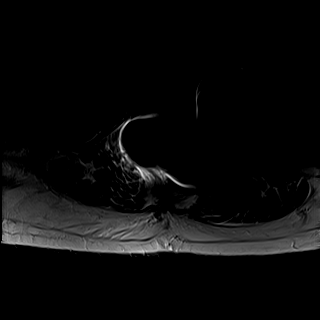
[im 7/40]
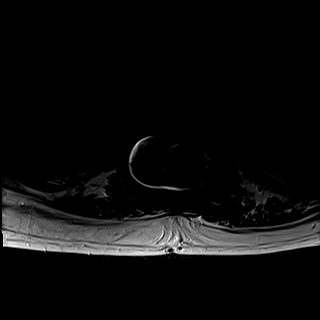
[im 14/40]
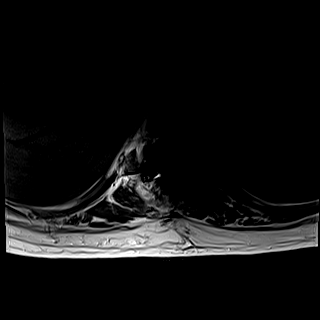
[im 20/40]
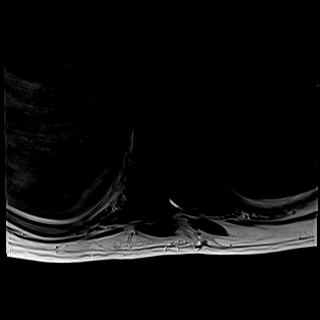
[im 27/40]
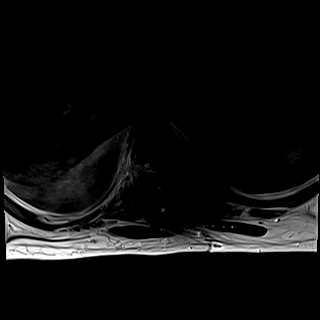
[im 33/40]
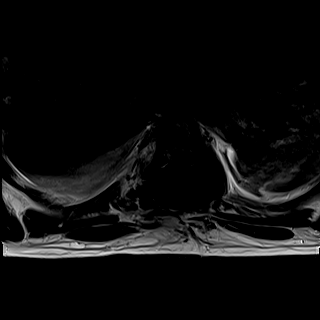
[im 40/40]
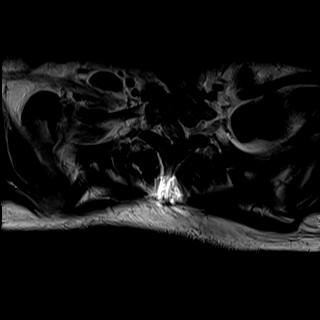

[Series 24: T1 · axial · non-contrast · 5.0mm · 0.31mm/px · z∈[-213,-19]mm · 5 of 40 slices shown (3 of 3)]
[im 1/40]
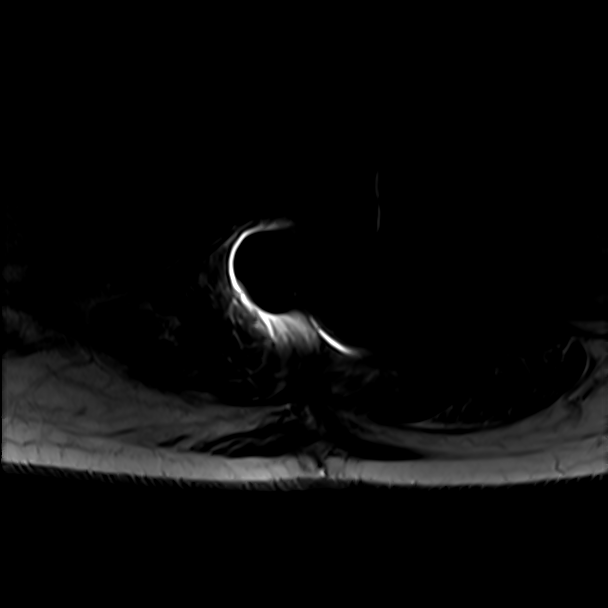
[im 7/40]
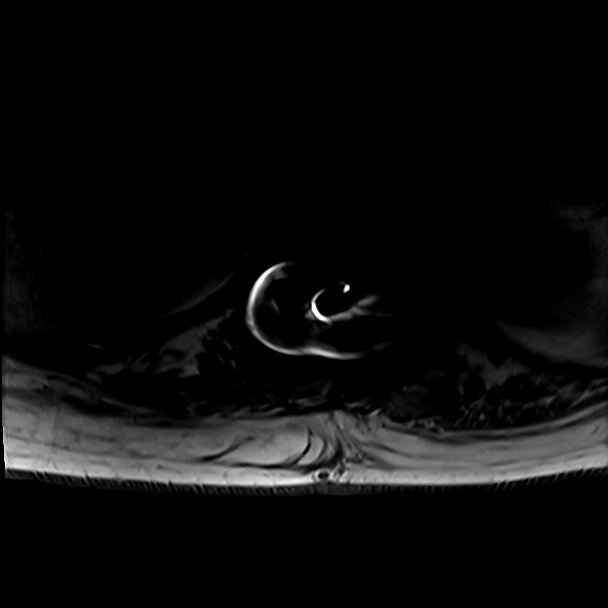
[im 14/40]
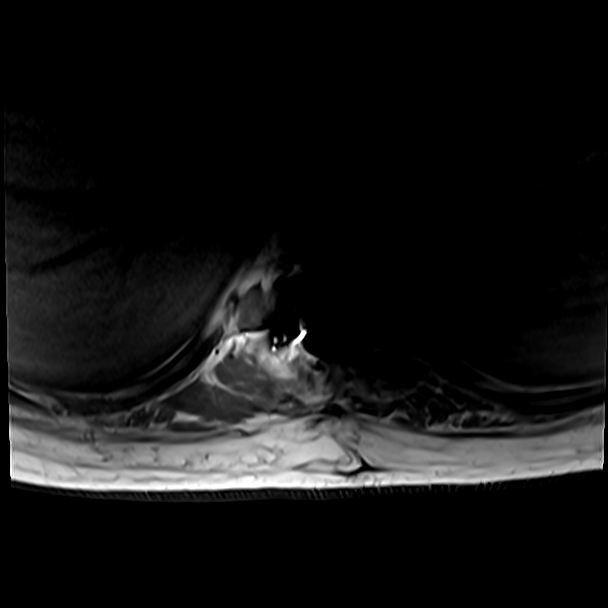
[im 20/40]
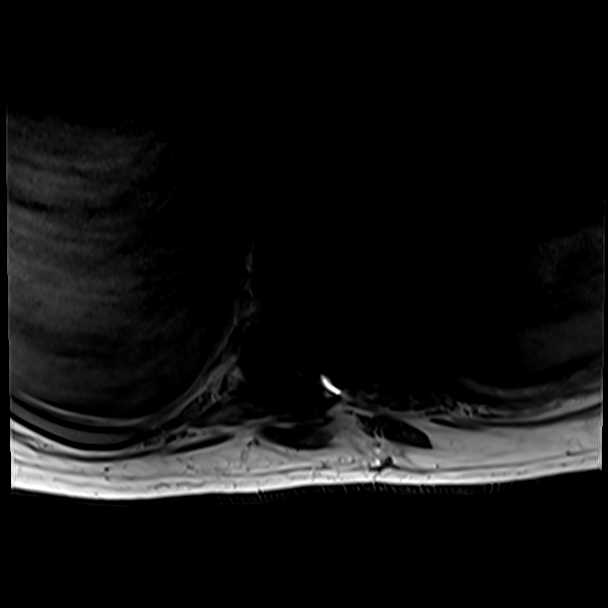
[im 27/40]
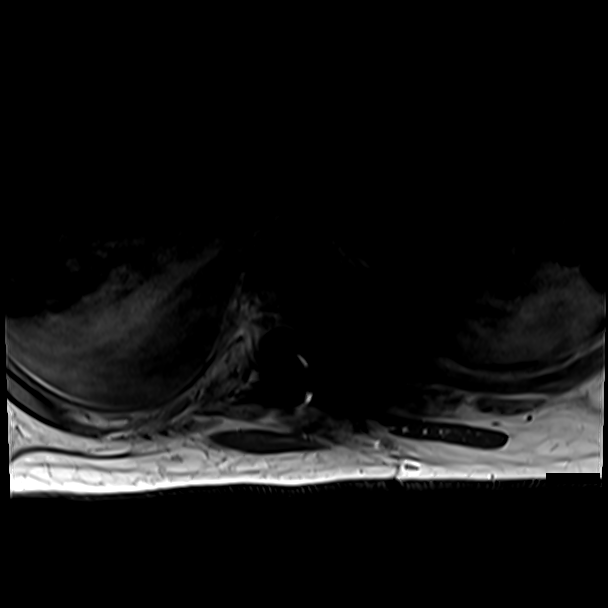

[20 of 48 positions shown; findings below may reference images not displayed]

FINDINGS: MRI THORACIC SPINE FINDINGS-spinal hardware resulting in extensive
susceptibility artifact.

ALIGNMENT: Straightened of the thoracic kyphosis. No malalignment.
Scoliosis on this nonweightbearing examination.

VERTEBRAE/DISCS: T3 through lumbar Harrington rods with laminar
hooks. Vertebral bodies are intact. Intervertebral discs morphology
and signal are normal though limited assessment. No abnormal bone
marrow signal though limited STIR sequence. Vertebral bodies
predominance cured on post gadolinium T1 sequence.

CORD: Thoracic spinal cord is normal morphology and signal
characteristics to the level of the conus medullaris which
terminates at T12-L1.

PREVERTEBRAL AND PARASPINAL SOFT TISSUES: Mild paraspinal muscle
atrophy with no suspicious signal. Small RIGHT pleural effusion and
multifocal consolidation.

DISC LEVELS:

No disc bulge, canal stenosis of upper thoracic spine to T3-4, below
levels predominant obscured.

MRI LUMBAR SPINE FINDINGS--spinal hardware resulting in extensive
susceptibility artifact.

SEGMENTATION: For the purposes of this report, the last well-formed
intervertebral disc is reported as L5-S1.

ALIGNMENT: Maintained lumbar lordosis. No malalignment. Thoracic
through L3 Harrington rods with laminar hooks.

VERTEBRAE:Vertebral bodies are intact. Moderate L4-5 and L5-S1 disc
height loss with increasing disc edema, no enhancement. No
suspicious vertebral body enhancement. Abnormal signal enhancement
LEFT sacrum.

CONUS MEDULLARIS AND CAUDA EQUINA: Conus medullaris obscured. Thecal
sac effacement due to epidural collection with rim enhancement
measuring to 17 x 9 mm (transverse by AP) at L4-5. Thecal sac is to
4 mm in AP dimension at L4-5.

PARASPINAL AND OTHER SOFT TISSUES: Bright STIR signal in suspected
enhancement paraspinal muscles lower lumbar spine. 2 cm probable
fluid collection LEFT paraspinal muscles at L4-5. Abnormal signal
enhancement L4-5 and possibly L5-S1 facets.

DISC LEVELS:

L1-2 and L2-3: Obscured.

L3-4: Thecal sac effacement due to epidural collection. Severe facet
arthropathy with abnormal enhancement. No osseous canal stenosis or
neural foraminal narrowing.

L4-5: Small broad-based disc bulge asymmetric to the LEFT. Thecal
sac effacement due to epidural collection. Moderate facet
arthropathy and ligamentum flavum redundancy. Mild neural foraminal
narrowing.

L5-S1: 6 x 7 mm RIGHT central disc extrusion versus abscess
displacing the traversing RIGHT S1 nerve. Annular fissure versus
annular enhancement from infection. Moderate RIGHT and severe LEFT
facet arthropathy without canal stenosis. Moderate RIGHT, moderate
to severe LEFT neural foraminal narrowing.
IMPRESSION: MRI thoracic spine:

1. Scoliosis and posterior spinal fixation rods limiting assessment.
2. Stable appearance without imaging evidence of acute osseous
process or infection in the spine.
3. Bilateral lung consolidation consistent with history of septic
emboli. Small RIGHT pleural effusion.

MRI lumbar spine:

1. Scoliosis and posterior spinal fixation rods limiting assessment.
2. New epidural abscess lower lumbar spine effacing thecal sac.
3. Suspected lower lumbar facet septic arthropathy. Probable
paraspinal myositis and 2 cm abscess.
4. L5-S1 6 x 7 mm disc extrusion versus abscess. Equivocal L5-S1
discitis.
5. Partially imaged possible LEFT sacral osteomyelitis.

These results will be called to the ordering clinician or
representative by the Radiologist Assistant, and communication
documented in the PACS or zVision Dashboard.

## 2019-04-12 IMAGING — CR DG LUMBAR SPINE 1V
1 series · 1 of 1 positions shown · non-contrast
Comparison: MRI lumbar spine 12/05/2017

CLINICAL DATA: Lumbar laminectomy.  Epidural abscess.

EXAM:
LUMBAR SPINE - 1 VIEW

[lateral]
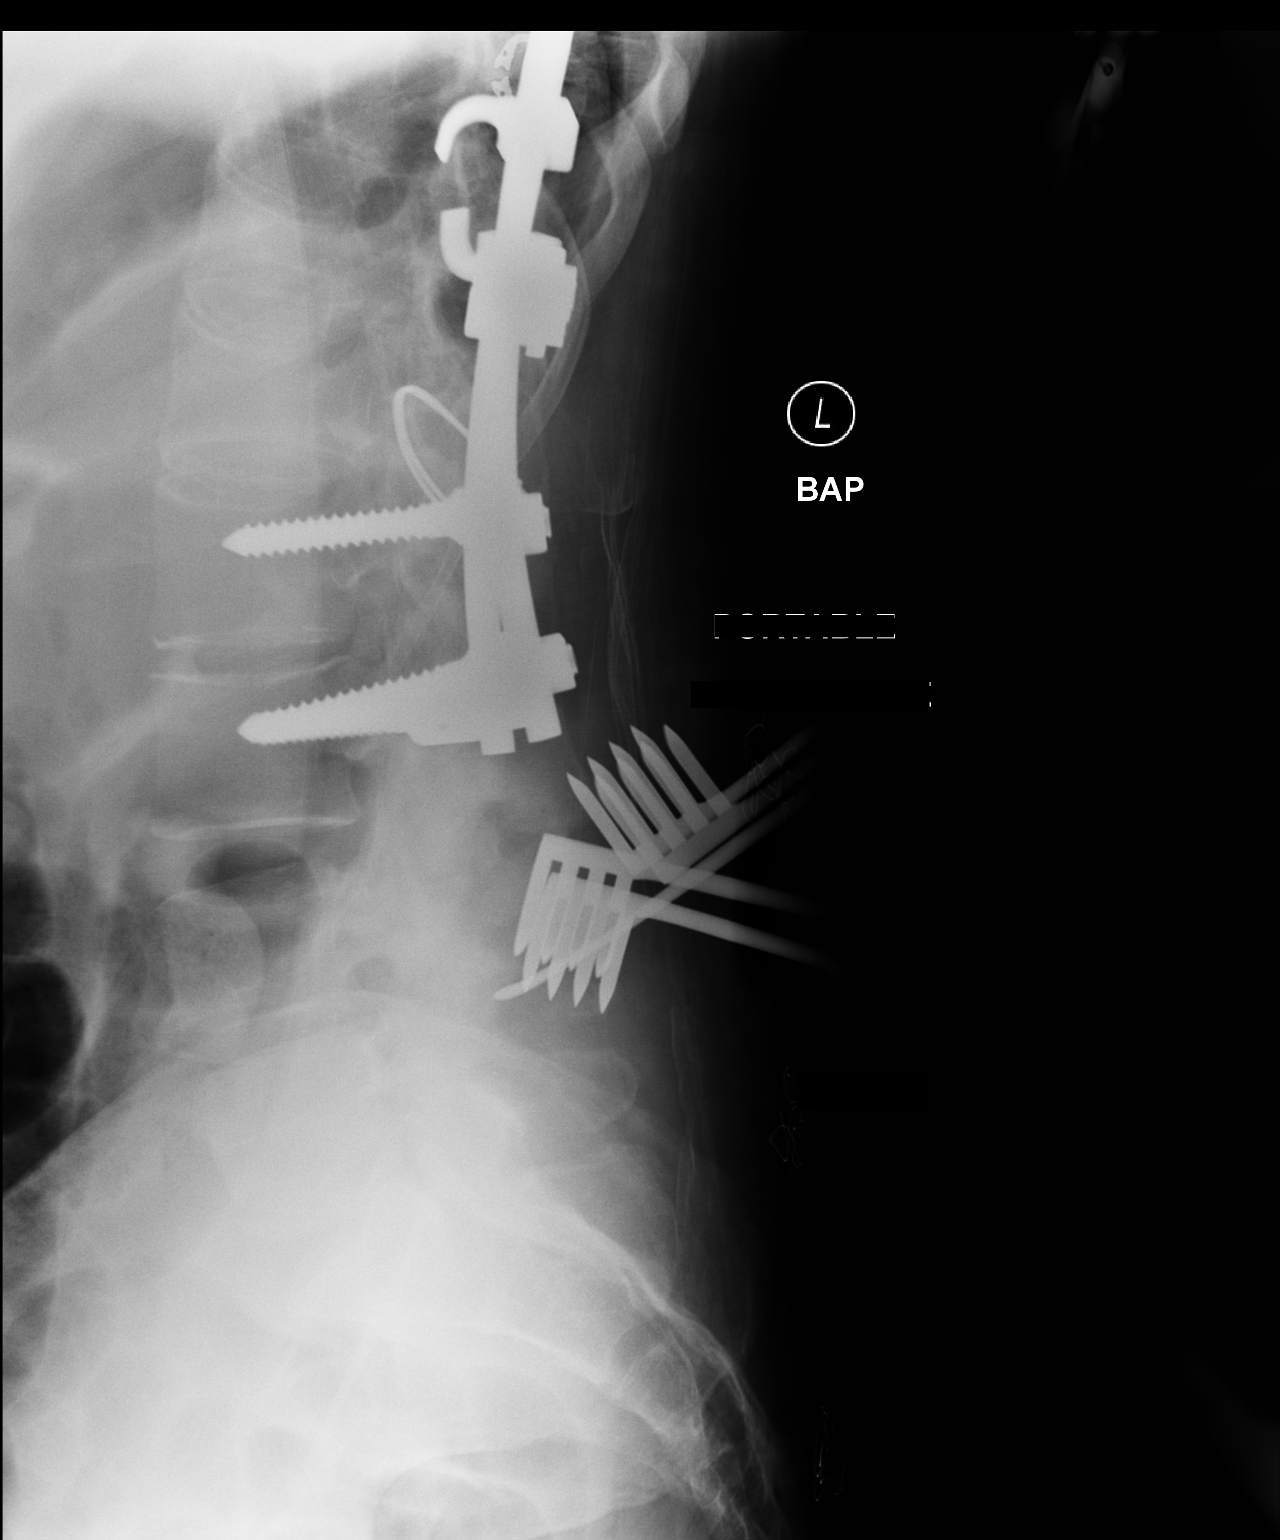

[1 of 1 positions shown; findings below may reference images not displayed]

FINDINGS: Cross-table lateral intraoperative examination is obtained
demonstrating surgical retractors posterior to the lumbar spine and
a localization marker projected over the soft tissues posterior to
the L4-5 lumbar interspace. Rod and screw fixations are demonstrated
extending from the thoracic spine down to the level of L3. Alignment
of the lumbar spine appears intact.
IMPRESSION: Intraoperative localization of L4-5.

## 2019-05-04 ENCOUNTER — Telehealth (HOSPITAL_COMMUNITY): Payer: Self-pay | Admitting: *Deleted

## 2019-05-04 ENCOUNTER — Encounter (HOSPITAL_COMMUNITY): Payer: Self-pay | Admitting: *Deleted

## 2019-05-04 DIAGNOSIS — I071 Rheumatic tricuspid insufficiency: Secondary | ICD-10-CM

## 2019-05-04 NOTE — Telephone Encounter (Signed)
Patient called for echo results stating she has not heard from our office I informed her Nira Conn schub,RN tried to reach her a few times before thanksgiving and left VM. Pt states she did not check her voicemail. Pt now aware of results. Per DB TEE and R/L heart cath scheduled and referral placed for Dr.Owen.

## 2019-05-05 ENCOUNTER — Other Ambulatory Visit (HOSPITAL_COMMUNITY): Payer: Self-pay | Admitting: *Deleted

## 2019-05-05 DIAGNOSIS — I071 Rheumatic tricuspid insufficiency: Secondary | ICD-10-CM

## 2019-05-05 MED ORDER — SODIUM CHLORIDE 0.9% FLUSH: 3.0000 mL | Freq: Two times a day (BID) | INTRAVENOUS | Status: DC

## 2019-05-18 ENCOUNTER — Other Ambulatory Visit (HOSPITAL_COMMUNITY)
Admission: RE | Admit: 2019-05-18 | Discharge: 2019-05-18 | Disposition: A | Discharge status: Home / Self Care | Payer: Medicaid Other | Source: Ambulatory Visit | Attending: Internal Medicine | Admitting: Internal Medicine

## 2019-05-18 DIAGNOSIS — Z01812 Encounter for preprocedural laboratory examination: Secondary | ICD-10-CM | POA: Diagnosis present

## 2019-05-18 DIAGNOSIS — Z20822 Contact with and (suspected) exposure to covid-19: Secondary | ICD-10-CM | POA: Diagnosis not present

## 2019-05-19 LAB — NOVEL CORONAVIRUS, NAA (HOSP ORDER, SEND-OUT TO REF LAB; TAT 18-24 HRS): SARS-CoV-2, NAA: NOT DETECTED

## 2019-05-21 ENCOUNTER — Ambulatory Visit (HOSPITAL_COMMUNITY): Payer: Medicaid Other | Admitting: Certified Registered Nurse Anesthetist

## 2019-05-21 ENCOUNTER — Ambulatory Visit (HOSPITAL_BASED_OUTPATIENT_CLINIC_OR_DEPARTMENT_OTHER)
Admission: RE | Admit: 2019-05-21 | Discharge: 2019-05-21 | Disposition: A | Discharge status: Home / Self Care | Payer: Medicaid Other | Source: Home / Self Care | Attending: Internal Medicine | Admitting: Internal Medicine

## 2019-05-21 ENCOUNTER — Other Ambulatory Visit: Payer: Self-pay

## 2019-05-21 ENCOUNTER — Encounter (HOSPITAL_COMMUNITY)
Admission: RE | Disposition: A | Discharge status: Home / Self Care | Payer: Self-pay | Source: Home / Self Care | Attending: Internal Medicine

## 2019-05-21 ENCOUNTER — Ambulatory Visit (HOSPITAL_COMMUNITY)
Admission: RE | Admit: 2019-05-21 | Discharge: 2019-05-21 | Disposition: A | Discharge status: Home / Self Care | Payer: Medicaid Other | Attending: Internal Medicine | Admitting: Internal Medicine

## 2019-05-21 ENCOUNTER — Encounter (HOSPITAL_COMMUNITY): Payer: Self-pay | Admitting: Internal Medicine

## 2019-05-21 DIAGNOSIS — Z8249 Family history of ischemic heart disease and other diseases of the circulatory system: Secondary | ICD-10-CM | POA: Insufficient documentation

## 2019-05-21 DIAGNOSIS — I071 Rheumatic tricuspid insufficiency: Secondary | ICD-10-CM | POA: Diagnosis not present

## 2019-05-21 DIAGNOSIS — I420 Dilated cardiomyopathy: Secondary | ICD-10-CM | POA: Insufficient documentation

## 2019-05-21 DIAGNOSIS — I361 Nonrheumatic tricuspid (valve) insufficiency: Secondary | ICD-10-CM

## 2019-05-21 DIAGNOSIS — Z87891 Personal history of nicotine dependence: Secondary | ICD-10-CM | POA: Insufficient documentation

## 2019-05-21 DIAGNOSIS — I119 Hypertensive heart disease without heart failure: Secondary | ICD-10-CM | POA: Diagnosis not present

## 2019-05-21 DIAGNOSIS — Z885 Allergy status to narcotic agent status: Secondary | ICD-10-CM | POA: Insufficient documentation

## 2019-05-21 HISTORY — PX: TEE WITHOUT CARDIOVERSION: SHX5443

## 2019-05-21 HISTORY — PX: RIGHT/LEFT HEART CATH AND CORONARY ANGIOGRAPHY: CATH118266

## 2019-05-21 LAB — CBC
HCT: 46.6 % — ABNORMAL HIGH (ref 36.0–46.0)
Hemoglobin: 15 g/dL (ref 12.0–15.0)
MCH: 29.9 pg (ref 26.0–34.0)
MCHC: 32.2 g/dL (ref 30.0–36.0)
MCV: 93 fL (ref 80.0–100.0)
Platelets: 345 10*3/uL (ref 150–400)
RBC: 5.01 MIL/uL (ref 3.87–5.11)
RDW: 12.6 % (ref 11.5–15.5)
WBC: 6.4 10*3/uL (ref 4.0–10.5)
nRBC: 0 % (ref 0.0–0.2)

## 2019-05-21 LAB — BASIC METABOLIC PANEL
Anion gap: 10 (ref 5–15)
BUN: 14 mg/dL (ref 6–20)
CO2: 24 mmol/L (ref 22–32)
Calcium: 9.3 mg/dL (ref 8.9–10.3)
Chloride: 104 mmol/L (ref 98–111)
Creatinine, Ser: 0.74 mg/dL (ref 0.44–1.00)
GFR calc Af Amer: >60 mL/min (ref >60)
GFR calc non Af Amer: >60 mL/min (ref >60)
Glucose, Bld: 86 mg/dL (ref 70–99)
Potassium: 4.2 mmol/L (ref 3.5–5.1)
Sodium: 138 mmol/L (ref 135–145)

## 2019-05-21 LAB — POCT I-STAT 7, (LYTES, BLD GAS, ICA,H+H)
Acid-base deficit: 7 mmol/L — ABNORMAL HIGH (ref 0.0–2.0)
Bicarbonate: 17.9 mmol/L — ABNORMAL LOW (ref 20.0–28.0)
Calcium, Ion: 0.6 mmol/L — CL (ref 1.15–1.40)
HCT: 27 % — ABNORMAL LOW (ref 36.0–46.0)
Hemoglobin: 9.2 g/dL — ABNORMAL LOW (ref 12.0–15.0)
O2 Saturation: 99 %
Potassium: 2.7 mmol/L — CL (ref 3.5–5.1)
Sodium: 152 mmol/L — ABNORMAL HIGH (ref 135–145)
TCO2: 19 mmol/L — ABNORMAL LOW (ref 22–32)
pCO2 arterial: 34.3 mmHg (ref 32.0–48.0)
pH, Arterial: 7.325 — ABNORMAL LOW (ref 7.350–7.450)
pO2, Arterial: 147 mmHg — ABNORMAL HIGH (ref 83.0–108.0)

## 2019-05-21 LAB — POCT I-STAT EG7
Acid-base deficit: 5 mmol/L — ABNORMAL HIGH (ref 0.0–2.0)
Bicarbonate: 21.9 mmol/L (ref 20.0–28.0)
Bicarbonate: 27.1 mmol/L (ref 20.0–28.0)
Calcium, Ion: 0.85 mmol/L — CL (ref 1.15–1.40)
Calcium, Ion: 1.21 mmol/L (ref 1.15–1.40)
HCT: 33 % — ABNORMAL LOW (ref 36.0–46.0)
HCT: 40 % (ref 36.0–46.0)
Hemoglobin: 11.2 g/dL — ABNORMAL LOW (ref 12.0–15.0)
Hemoglobin: 13.6 g/dL (ref 12.0–15.0)
O2 Saturation: 64 %
O2 Saturation: 66 %
Potassium: 3.3 mmol/L — ABNORMAL LOW (ref 3.5–5.1)
Potassium: 4.4 mmol/L (ref 3.5–5.1)
Sodium: 138 mmol/L (ref 135–145)
Sodium: 145 mmol/L (ref 135–145)
TCO2: 23 mmol/L (ref 22–32)
TCO2: 29 mmol/L (ref 22–32)
pCO2, Ven: 45.2 mmHg (ref 44.0–60.0)
pCO2, Ven: 53.7 mmHg (ref 44.0–60.0)
pH, Ven: 7.294 (ref 7.250–7.430)
pH, Ven: 7.311 (ref 7.250–7.430)
pO2, Ven: 37 mmHg (ref 32.0–45.0)
pO2, Ven: 38 mmHg (ref 32.0–45.0)

## 2019-05-21 LAB — POCT PREGNANCY, URINE: Preg Test, Ur: NEGATIVE

## 2019-05-21 SURGERY — ECHOCARDIOGRAM, TRANSESOPHAGEAL
Anesthesia: Monitor Anesthesia Care

## 2019-05-21 SURGERY — RIGHT/LEFT HEART CATH AND CORONARY ANGIOGRAPHY
Anesthesia: LOCAL

## 2019-05-21 MED ORDER — LACTATED RINGERS IV SOLN: INTRAVENOUS | Status: DC | PRN

## 2019-05-21 MED ORDER — SODIUM CHLORIDE 0.9 % IV SOLN: INTRAVENOUS | Status: DC

## 2019-05-21 MED ORDER — PROPOFOL 500 MG/50ML IV EMUL
INTRAVENOUS | Status: DC | PRN
  Administered 2019-05-21: 150 ug/kg/min via INTRAVENOUS

## 2019-05-21 MED ORDER — IOHEXOL 350 MG/ML SOLN
INTRAVENOUS | Status: DC | PRN
  Administered 2019-05-21: 35 mL

## 2019-05-21 MED ORDER — LIDOCAINE HCL (PF) 1 % IJ SOLN
INTRAMUSCULAR | Status: DC | PRN
  Administered 2019-05-21 (×2): 2 mL via INTRADERMAL

## 2019-05-21 MED ORDER — LIDOCAINE 2% (20 MG/ML) 5 ML SYRINGE
INTRAMUSCULAR | Status: DC | PRN
  Administered 2019-05-21: 80 mg via INTRAVENOUS
  Administered 2019-05-21: 20 mg via INTRAVENOUS

## 2019-05-21 MED ORDER — VERAPAMIL HCL 2.5 MG/ML IV SOLN
INTRAVENOUS | Status: DC | PRN
  Administered 2019-05-21: 12:00:00 5 mL via INTRA_ARTERIAL

## 2019-05-21 MED ORDER — SODIUM CHLORIDE 0.9% FLUSH: 3.0000 mL | INTRAVENOUS | Status: DC | PRN

## 2019-05-21 MED ORDER — HEPARIN SODIUM (PORCINE) 1000 UNIT/ML IJ SOLN
INTRAMUSCULAR | Status: AC
  Filled 2019-05-21: qty 1

## 2019-05-21 MED ORDER — HEPARIN SODIUM (PORCINE) 1000 UNIT/ML IJ SOLN
INTRAMUSCULAR | Status: DC | PRN
  Administered 2019-05-21: 4000 [IU] via INTRAVENOUS

## 2019-05-21 MED ORDER — HEPARIN (PORCINE) IN NACL 1000-0.9 UT/500ML-% IV SOLN
INTRAVENOUS | Status: AC
  Filled 2019-05-21: qty 1000

## 2019-05-21 MED ORDER — LIDOCAINE HCL (PF) 1 % IJ SOLN
INTRAMUSCULAR | Status: AC
  Filled 2019-05-21: qty 30

## 2019-05-21 MED ORDER — VERAPAMIL HCL 2.5 MG/ML IV SOLN
INTRAVENOUS | Status: AC
  Filled 2019-05-21: qty 2

## 2019-05-21 MED ORDER — ASPIRIN 81 MG PO CHEW: 81.0000 mg | CHEWABLE_TABLET | ORAL | Status: DC

## 2019-05-21 MED ORDER — SODIUM CHLORIDE 0.9 % IV SOLN: 250.0000 mL | INTRAVENOUS | Status: DC | PRN

## 2019-05-21 MED ORDER — HEPARIN (PORCINE) IN NACL 1000-0.9 UT/500ML-% IV SOLN
INTRAVENOUS | Status: DC | PRN
  Administered 2019-05-21 (×2): 500 mL

## 2019-05-21 SURGICAL SUPPLY — 11 items
CATH 5FR JL3.5 JR4 ANG PIG MP (CATHETERS) ×2 IMPLANT
CATH BALLN WEDGE 5F 110CM (CATHETERS) ×2 IMPLANT
CATH LAUNCHER 5F JL3 (CATHETERS) ×1 IMPLANT
CATHETER LAUNCHER 5F JL3 (CATHETERS) ×2
DEVICE RAD COMP TR BAND LRG (VASCULAR PRODUCTS) ×2 IMPLANT
GLIDESHEATH SLEND SS 6F .021 (SHEATH) ×2 IMPLANT
GUIDEWIRE INQWIRE 1.5J.035X260 (WIRE) ×1 IMPLANT
INQWIRE 1.5J .035X260CM (WIRE) ×2
PACK CARDIAC CATHETERIZATION (CUSTOM PROCEDURE TRAY) ×2 IMPLANT
SHEATH GLIDE SLENDER 4/5FR (SHEATH) ×2 IMPLANT
TRANSDUCER W/STOPCOCK (MISCELLANEOUS) ×2 IMPLANT

## 2019-05-21 NOTE — Discharge Instructions (Signed)
 Radial Site Care  This sheet gives you information about how to care for yourself after your procedure. Your health care provider may also give you more specific instructions. If you have problems or questions, contact your health care provider. What can I expect after the procedure? After the procedure, it is common to have:  Bruising and tenderness at the catheter insertion area. Follow these instructions at home: Medicines  Take over-the-counter and prescription medicines only as told by your health care provider. Insertion site care  Follow instructions from your health care provider about how to take care of your insertion site. Make sure you: ? Wash your hands with soap and water before you change your bandage (dressing). If soap and water are not available, use hand sanitizer. ? Change your dressing as told by your health care provider. ? Leave stitches (sutures), skin glue, or adhesive strips in place. These skin closures may need to stay in place for 2 weeks or longer. If adhesive strip edges start to loosen and curl up, you may trim the loose edges. Do not remove adhesive strips completely unless your health care provider tells you to do that.  Check your insertion site every day for signs of infection. Check for: ? Redness, swelling, or pain. ? Fluid or blood. ? Pus or a bad smell. ? Warmth.  Do not take baths, swim, or use a hot tub until your health care provider approves.  You may shower 24-48 hours after the procedure, or as directed by your health care provider. ? Remove the dressing and gently wash the site with plain soap and water. ? Pat the area dry with a clean towel. ? Do not rub the site. That could cause bleeding.  Do not apply powder or lotion to the site. Activity   For 24 hours after the procedure, or as directed by your health care provider: ? Do not flex or bend the affected arm. ? Do not push or pull heavy objects with the affected arm. ? Do not  drive yourself home from the hospital or clinic. You may drive 24 hours after the procedure unless your health care provider tells you not to. ? Do not operate machinery or power tools.  Do not lift anything that is heavier than 10 lb (4.5 kg), or the limit that you are told, until your health care provider says that it is safe.  Ask your health care provider when it is okay to: ? Return to work or school. ? Resume usual physical activities or sports. ? Resume sexual activity. General instructions  If the catheter site starts to bleed, raise your arm and put firm pressure on the site. If the bleeding does not stop, get help right away. This is a medical emergency.  If you went home on the same day as your procedure, a responsible adult should be with you for the first 24 hours after you arrive home.  Keep all follow-up visits as told by your health care provider. This is important. Contact a health care provider if:  You have a fever.  You have redness, swelling, or yellow drainage around your insertion site. Get help right away if:  You have unusual pain at the radial site.  The catheter insertion area swells very fast.  The insertion area is bleeding, and the bleeding does not stop when you hold steady pressure on the area.  Your arm or hand becomes pale, cool, tingly, or numb. These symptoms may represent a serious   problem that is an emergency. Do not wait to see if the symptoms will go away. Get medical help right away. Call your local emergency services (911 in the [place] Do not drive yourself to the hospital. Summary  After the procedure, it is common to have bruising and tenderness at the site.  Follow instructions from your health care provider about how to take care of your radial site wound. Check the wound every day for signs of infection.  Do not lift anything that is heavier than 10 lb (4.5 kg), or the limit that you are told, until your health care provider says  that it is safe. This information is not intended to replace advice given to you by your health care provider. Make sure you discuss any questions you have with your health care provider. Document Revised: 06/05/2017 Document Reviewed: 06/05/2017 Elsevier Patient Education  2020 Elsevier Inc.        Moderate Conscious Sedation, Adult, Care After These instructions provide you with information about caring for yourself after your procedure. Your health care provider may also give you more specific instructions. Your treatment has been planned according to current medical practices, but problems sometimes occur. Call your health care provider if you have any problems or questions after your procedure. What can I expect after the procedure? After your procedure, it is common:  To feel sleepy for several hours.  To feel clumsy and have poor balance for several hours.  To have poor judgment for several hours.  To vomit if you eat too soon. Follow these instructions at home: For at least 24 hours after the procedure:   Do not: ? Participate in activities where you could fall or become injured. ? Drive. ? Use heavy machinery. ? Drink alcohol. ? Take sleeping pills or medicines that cause drowsiness. ? Make important decisions or sign legal documents. ? Take care of children on your own.  Rest. Eating and drinking  Follow the diet recommended by your health care provider.  If you vomit: ? Drink water, juice, or soup when you can drink without vomiting. ? Make sure you have little or no nausea before eating solid foods. General instructions  Have a responsible adult stay with you until you are awake and alert.  Take over-the-counter and prescription medicines only as told by your health care provider.  If you smoke, do not smoke without supervision.  Keep all follow-up visits as told by your health care provider. This is important. Contact a health care provider  if:  You keep feeling nauseous or you keep vomiting.  You feel light-headed.  You develop a rash.  You have a fever. Get help right away if:  You have trouble breathing. This information is not intended to replace advice given to you by your health care provider. Make sure you discuss any questions you have with your health care provider. Document Revised: 04/12/2017 Document Reviewed: 08/20/2015 Elsevier Patient Education  2020 Elsevier Inc.  

## 2019-05-21 NOTE — CV Procedure (Signed)
    TRANSESOPHAGEAL ECHOCARDIOGRAM   NAME:  Kelly [name]   MRN: [id number] DOB:  Aug 30, 1985   ADMIT DATE: 05/21/2019  INDICATIONS:   PROCEDURE:   Informed consent was obtained prior to the procedure. The risks, benefits and alternatives for the procedure were discussed and the patient comprehended these risks.  Risks include, but are not limited to, cough, sore throat, vomiting, nausea, somnolence, esophageal and stomach trauma or perforation, bleeding, low blood pressure, aspiration, pneumonia, infection, trauma to the teeth and death.    After a procedural time-out, the patient was sedated by the anesthesia service with IV propofol. The transesophageal probe was inserted in the esophagus and stomach without difficulty and multiple views were obtained.    COMPLICATIONS:    There were no immediate complications.  FINDINGS:  LEFT VENTRICLE: EF = 50%. No regional wall motion abnormalities.  RIGHT VENTRICLE: Moderately dilated. Mild HK.   LEFT ATRIUM: Mildly dialted  LEFT ATRIAL APPENDAGE: No thrombus.   RIGHT ATRIUM: Moderately to severely dialted  AORTIC VALVE:  Trileaflet. No AI/AS  MITRAL VALVE:    Normal. Trivial TR  TRICUSPID VALVE: ? Mild prolapse. Severe TR. On 3-D images appears to have destruction of anterior leaflet. No residual vegetation  PULMONIC VALVE: Normal. No PI/PR  INTERATRIAL SEPTUM: No PFO or ASD.  PERICARDIUM: No effusion  DESCENDING AORTA: Trivial plaque    Kelly Czerwinski,MD 11:33 AM

## 2019-05-21 NOTE — Interval H&P Note (Signed)
History and Physical Interval Note:  05/21/2019 11:33 AM  Kelly [name]  has presented today for surgery, with the diagnosis of SEVERE TRICUSPID REGURGITATION.  The various methods of treatment have been discussed with the patient and family. After consideration of risks, benefits and other options for treatment, the patient has consented to  Procedure(s): TRANSESOPHAGEAL ECHOCARDIOGRAM (TEE) (N/A) as a surgical intervention.  The patient's history has been reviewed, patient examined, no change in status, stable for surgery.  I have reviewed the patient's chart and labs.  Questions were answered to the patient's satisfaction.     Brandye Inthavong

## 2019-05-21 NOTE — Transfer of Care (Signed)
Immediate Anesthesia Transfer of Care Note  Patient: Kelly [name]  Procedure(s) Performed: TRANSESOPHAGEAL ECHOCARDIOGRAM (TEE) (N/A )  Patient Location: PACU  Anesthesia Type:MAC  Level of Consciousness: drowsy  Airway & Oxygen Therapy: Patient Spontanous Breathing and Patient connected to nasal cannula oxygen  Post-op Assessment: Report given to RN and Post -op Vital signs reviewed and stable  Post vital signs: Reviewed and stable  Last Vitals:  Vitals Value Taken Time  BP    Temp    Pulse    Resp    SpO2      Last Pain:  Vitals:   05/21/19 0924  TempSrc: Oral  PainSc: 0-No pain         Complications: No apparent anesthesia complications

## 2019-05-21 NOTE — Progress Notes (Signed)
  Echocardiogram Echocardiogram Transesophageal with 3D has been performed.  Kelly [name] M 05/21/2019, 11:53 AM

## 2019-05-21 NOTE — H&P (Signed)
ADVANCED HF H&P  NOTE  Referring Physician: Dr. Gwenlyn Found Primary Cardiologist: Dr. Gwenlyn Found  HPI:  Kelly [name] is a 34 y/o woman with h/o polysubstance abuse/IVDA referred by Dr. Gwenlyn Found for further evaluation bacterial endocarditis with severe tricuspid regurgitation and LV systolic dysfunction.   She was admitted in July 2019 with TV endocarditis and a lumbar abscess.  At that time she has severe TR and a tricuspid vegetation.  She was treated with IV antibiotics had a prolonged hospitalization and underwent laminectomy and drainage of lumbar abscess.  She was readmitted on 06/14/2018 with sepsis, pneumonia and recurrent endocarditis. Echo performed 06/16/2018 showed a large tricuspid vegetation, severe TR and normal LV function.  Her most recent echo performed 08/26/2018 revealed significant drop in LV function down to an EF of 30%.  The tricuspid vegetation had resolved however she still had severe TR. She was [redacted] weeks pregnant at that time.   Since that time she has delivered a healthy baby girl in June. I saw her in clinic in 11/20. Denied any recent IVDA. Reports being clean since 06/10/18. Taking methadone.  Scheduled for TEE and cath today priot to possible surgical repair. Echo 11/20 EF 55-60% severe TR but RV felt to be normal. + PAH  Says she is doing ok. Tired but denies significant SOB, orthopnea or PND. Minimal edema      Review of Systems: [y] = yes, [ ]  = no   General: Weight gain [ ] ; Weight loss [ ] ; Anorexia [ ] ; Fatigue [ y]; Fever [ ] ; Chills [ ] ; Weakness [ y]  Cardiac: Chest pain/pressure [ ] ; Resting SOB [ ] ; Exertional SOB [ ] ; Orthopnea [ ] ; Pedal Edema [ ] ; Palpitations [ ] ; Syncope [ ] ; Presyncope [ ] ; Paroxysmal nocturnal dyspnea[ ]   Pulmonary: Cough [ ] ; Wheezing[ ] ; Hemoptysis[ ] ; Sputum [ ] ; Snoring [ ]   GI: Vomiting[ ] ; Dysphagia[ ] ; Melena[ ] ; Hematochezia [ ] ; Heartburn[ ] ; Abdominal pain [ ] ; Constipation [ ] ; Diarrhea [ ] ; BRBPR [ ]   GU: Hematuria[ ] ;  Dysuria [ ] ; Nocturia[ ]   Vascular: Pain in legs with walking [ ] ; Pain in feet with lying flat [ ] ; Non-healing sores [ ] ; Stroke [ ] ; TIA [ ] ; Slurred speech [ ] ;  Neuro: Headaches[ ] ; Vertigo[ ] ; Seizures[ ] ; Paresthesias[ ] ;Blurred vision [ ] ; Diplopia [ ] ; Vision changes [ ]   Ortho/Skin: Arthritis [ ] ; Joint pain [ ] ; Muscle pain [ ] ; Joint swelling [ ] ; Back Pain [ y]; Rash [ ]   Psych: Depression[y ]; Anxiety[y ]  Heme: Bleeding problems [ ] ; Clotting disorders [ ] ; Anemia [ ]   Endocrine: Diabetes [ ] ; Thyroid dysfunction[ ]    Past Medical History:  Diagnosis Date  . 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 Abscess of breast    rt breast  . ADD (attention deficit disorder)   . Back pain   . Bipolar 1 disorder ([place])   . Hypertension   . Leukocytosis   . Polysubstance abuse ([place])   . Pyelonephritis affecting pregnancy 05/24/2016  . Sepsis ([place]) 12/02/2017  . Suicidal overdose ([place])     Current Facility-Administered Medications  Medication Dose Route Frequency Provider Last Rate Last Admin  . 0.9 %  sodium chloride infusion   Intravenous Continuous Ondria Oswald R, MD      . 0.9 %  sodium chloride infusion  250 mL Intravenous PRN Rosaleen Mazer, Shaune Pascal, MD      . 0.9 %  sodium chloride infusion   Intravenous Continuous Savon Bordonaro, Shaune Pascal, MD      . aspirin chewable tablet 81 mg  81 mg Oral Pre-Cath Seon Gaertner, Shaune Pascal, MD      . sodium chloride flush (NS) 0.9 % injection 3 mL  3 mL Intravenous PRN Aliese Brannum, Shaune Pascal, MD       Facility-Administered Medications Ordered in Other Encounters  Medication Dose Route Frequency Provider Last Rate Last Admin  . lactated ringers infusion   Intravenous Continuous PRN Milford Cage, CRNA   New Bag at 05/21/19 1050  . lidocaine 2% (20 mg/mL) 5 mL syringe   Intravenous Anesthesia Intra-op Milford Cage, CRNA   20 mg at 05/21/19 1101  . propofol (DIPRIVAN) 500 MG/50ML infusion   Intravenous Continuous PRN Milford Cage, CRNA   Stopped at 05/21/19 1124    Allergies  Allergen Reactions  . Hydrocodone Itching  . Morphine And Related Nausea And Vomiting      Social History   Socioeconomic History  . Marital status: Single    Spouse name: Not on file  . Number of children: Not on file  . Years of education: Not on file  . Highest education level: Not on file  Occupational History  . Not on file  Tobacco Use  . Smoking status: Former Smoker    Packs/day: 1.00    Years: 13.00    Pack years: 13.00    Types: Cigarettes    Quit date: 06/13/2018    Years since quitting: 0.9  . Smokeless tobacco: Never Used  Substance and Sexual Activity  . Alcohol use: Not Currently    Comment: one or two drinks weekly  . Drug use: Yes    Types: Cocaine, Heroin, IV    Comment: heroin, crack cocaine last used 06/13/2018  . Sexual activity: Yes    Birth control/protection: Condom  Other Topics Concern  . Not on file  Social History Narrative  . Not on file   Social Determinants of Health   Financial Resource Strain:   . Difficulty of Paying Living Expenses: Not on file  Food Insecurity:   . Worried About Charity fundraiser in the Last Year: Not on file  . Ran Out of Food in the Last Year: Not on file  Transportation Needs:   . Lack of Transportation (Medical): Not on file  . Lack of Transportation (Non-Medical): Not on file  Physical Activity:   . Days of Exercise per Week: Not on file  . Minutes of Exercise per Session: Not on file  Stress:   . Feeling of Stress : Not on file  Social Connections:   . Frequency of Communication with Friends and Family: Not on file  . Frequency of Social Gatherings with Friends and Family: Not on file  . Attends Religious Services: Not on file  . Active Member of Clubs or Organizations: Not on file  . Attends Archivist Meetings: Not on file  . Marital Status: Not on file  Intimate Partner Violence:   . Fear of Current or Ex-Partner: Not on file  .  Emotionally Abused: Not on file  . Physically Abused: Not on file  . Sexually Abused: Not on file      Family History  Problem Relation Age of Onset  . Diabetes Mother   . Diabetes Father   . Hypertension Father   . Heart attack Neg Hx   . Hyperlipidemia Neg Hx   . Sudden death  Neg Hx     Vitals:   05/21/19 0924  BP: 116/77  Pulse: (!) 54  Resp: 12  Temp: 98.1 F (36.7 C)  TempSrc: Oral  SpO2: 96%  Weight: 78 kg  Height: 5' 7.5" (1.715 m)    PHYSICAL EXAM: General:  Well appearing. No resp difficulty HEENT: normal Neck: supple. no JVD. Carotids 2+ bilat; no bruits. No lymphadenopathy or thryomegaly appreciated. Cor: PMI nondisplaced. Regular rate & rhythm. 2/6 TR Lungs: clear Abdomen: soft, nontender, nondistended. No hepatosplenomegaly. No bruits or masses. Good bowel sounds. Extremities: no cyanosis, clubbing, rash, edema Neuro: alert & orientedx3, cranial nerves grossly intact. moves all 4 extremities w/o difficulty. Affect pleasant   ECG: Sinus brady 57 inferolateral TWI Personally reviewed    ASSESSMENT & PLAN:  1. Severe tricuspid regurgitation - due to bacterial endocarditis in setting of IVDA. - now off IV drug use since 1/20 - no significant RV failure - For TEE & R/L cath today  2. LV dysfunction - previous echo with EF 30% - echo 11/20 Ef 55-60%  3. H/o IVDA - remains abstinent    Glori Bickers, MD  11:29 AM

## 2019-05-21 NOTE — Anesthesia Preprocedure Evaluation (Signed)
Anesthesia Evaluation  Patient identified by MRN, date of birth, ID band Patient awake    Reviewed: Allergy & Precautions, H&P , NPO status , Patient's Chart, lab work & pertinent test results  Airway Mallampati: II   Neck ROM: full    Dental   Pulmonary former smoker,    breath sounds clear to auscultation       Cardiovascular hypertension, + Valvular Problems/Murmurs  Rhythm:regular Rate:Normal  H/o endocarditis on TV with severe TR   Neuro/Psych PSYCHIATRIC DISORDERS Anxiety Depression Bipolar Disorder    GI/Hepatic (+)     substance abuse  IV drug use,   Endo/Other    Renal/GU      Musculoskeletal  (+) Arthritis ,   Abdominal   Peds  Hematology   Anesthesia Other Findings   Reproductive/Obstetrics                             Anesthesia Physical Anesthesia Plan  ASA: III  Anesthesia Plan: MAC   Post-op Pain Management:    Induction: Intravenous  PONV Risk Score and Plan: 2 and Propofol infusion, Ondansetron, Treatment may vary due to age or medical condition and Midazolam  Airway Management Planned: Nasal Cannula  Additional Equipment:   Intra-op Plan:   Post-operative Plan:   Informed Consent: I have reviewed the patients History and Physical, chart, labs and discussed the procedure including the risks, benefits and alternatives for the proposed anesthesia with the patient or authorized representative who has indicated his/her understanding and acceptance.       Plan Discussed with: CRNA, Anesthesiologist and Surgeon  Anesthesia Plan Comments:         Anesthesia Quick Evaluation

## 2019-05-21 NOTE — Interval H&P Note (Signed)
History and Physical Interval Note:  05/21/2019 11:36 AM  Kelly [name]  has presented today for surgery, with the diagnosis of heart failure.  The various methods of treatment have been discussed with the patient and family. After consideration of risks, benefits and other options for treatment, the patient has consented to  Procedure(s): RIGHT/LEFT HEART CATH AND CORONARY ANGIOGRAPHY (N/A) as a surgical intervention.  The patient's history has been reviewed, patient examined, no change in status, stable for surgery.  I have reviewed the patient's chart and labs.  Questions were answered to the patient's satisfaction.     [name]

## 2019-05-22 ENCOUNTER — Telehealth (HOSPITAL_COMMUNITY): Payer: Self-pay | Admitting: *Deleted

## 2019-05-22 NOTE — Telephone Encounter (Signed)
Pt aware.

## 2019-05-22 NOTE — Telephone Encounter (Signed)
24 hour restriction.    Should be fine by tomorrow.

## 2019-05-22 NOTE — Telephone Encounter (Signed)
pts mother left VM stating pt had a cath/tee yesterday and wants to know when she will be able to use her arm that they went through for the cath. Pt has a baby and needs to be able to care for her and pick her up as soon as possible.   Routed to [place] for advice

## 2019-05-25 NOTE — Anesthesia Postprocedure Evaluation (Signed)
Anesthesia Post Note  Patient: Anala N Kubicek  Procedure(s) Performed: TRANSESOPHAGEAL ECHOCARDIOGRAM (TEE) (N/A )     Patient location during evaluation: Endoscopy Anesthesia Type: MAC Level of consciousness: awake and alert Pain management: pain level controlled Vital Signs Assessment: post-procedure vital signs reviewed and stable Respiratory status: spontaneous breathing, nonlabored ventilation, respiratory function stable and patient connected to nasal cannula oxygen Cardiovascular status: blood pressure returned to baseline and stable Postop Assessment: no apparent nausea or vomiting Anesthetic complications: no    Last Vitals:  Vitals:   05/21/19 1545 05/21/19 1550  BP:  (!) 92/58  Pulse: 65   Resp:    Temp:    SpO2: 97%     Last Pain:  Vitals:   05/22/19 1246  TempSrc:   PainSc: 0-No pain                 Aristeo Hankerson S

## 2019-06-01 ENCOUNTER — Encounter: Payer: Self-pay | Admitting: Thoracic Surgery (Cardiothoracic Vascular Surgery)

## 2019-06-01 ENCOUNTER — Encounter: Payer: Medicaid Other | Admitting: Thoracic Surgery (Cardiothoracic Vascular Surgery)

## 2019-06-15 ENCOUNTER — Encounter: Payer: Medicaid Other | Admitting: Thoracic Surgery (Cardiothoracic Vascular Surgery)

## 2019-06-24 ENCOUNTER — Inpatient Hospital Stay (HOSPITAL_COMMUNITY): Admission: RE | Admit: 2019-06-24 | Payer: Medicaid Other | Source: Ambulatory Visit | Admitting: Internal Medicine

## 2019-06-26 ENCOUNTER — Other Ambulatory Visit: Payer: Self-pay

## 2019-06-26 ENCOUNTER — Observation Stay (HOSPITAL_COMMUNITY)
Admission: AD | Admit: 2019-06-26 | Discharge: 2019-06-28 | Disposition: A | Discharge status: Home / Self Care | Payer: Medicaid Other | Attending: Psychiatry | Admitting: Psychiatry

## 2019-06-26 ENCOUNTER — Encounter (HOSPITAL_COMMUNITY): Payer: Self-pay | Admitting: Registered Nurse

## 2019-06-26 DIAGNOSIS — I509 Heart failure, unspecified: Secondary | ICD-10-CM | POA: Insufficient documentation

## 2019-06-26 DIAGNOSIS — R45851 Suicidal ideations: Secondary | ICD-10-CM | POA: Diagnosis present

## 2019-06-26 DIAGNOSIS — F314 Bipolar disorder, current episode depressed, severe, without psychotic features: Principal | ICD-10-CM | POA: Insufficient documentation

## 2019-06-26 DIAGNOSIS — I110 Hypertensive heart disease with heart failure: Secondary | ICD-10-CM | POA: Diagnosis not present

## 2019-06-26 DIAGNOSIS — F1910 Other psychoactive substance abuse, uncomplicated: Secondary | ICD-10-CM | POA: Diagnosis present

## 2019-06-26 DIAGNOSIS — Z885 Allergy status to narcotic agent status: Secondary | ICD-10-CM | POA: Diagnosis not present

## 2019-06-26 DIAGNOSIS — Z20822 Contact with and (suspected) exposure to covid-19: Secondary | ICD-10-CM | POA: Insufficient documentation

## 2019-06-26 DIAGNOSIS — F332 Major depressive disorder, recurrent severe without psychotic features: Secondary | ICD-10-CM | POA: Diagnosis present

## 2019-06-26 DIAGNOSIS — F1110 Opioid abuse, uncomplicated: Secondary | ICD-10-CM

## 2019-06-26 DIAGNOSIS — F1914 Other psychoactive substance abuse with psychoactive substance-induced mood disorder: Secondary | ICD-10-CM | POA: Insufficient documentation

## 2019-06-26 DIAGNOSIS — Z8249 Family history of ischemic heart disease and other diseases of the circulatory system: Secondary | ICD-10-CM | POA: Diagnosis not present

## 2019-06-26 DIAGNOSIS — Z87891 Personal history of nicotine dependence: Secondary | ICD-10-CM | POA: Insufficient documentation

## 2019-06-26 LAB — COMPREHENSIVE METABOLIC PANEL
ALT: 10 U/L (ref 0–44)
AST: 21 U/L (ref 15–41)
Albumin: 3.4 g/dL — ABNORMAL LOW (ref 3.5–5.0)
Alkaline Phosphatase: 96 U/L (ref 38–126)
Anion gap: 7 (ref 5–15)
BUN: 7 mg/dL (ref 6–20)
CO2: 30 mmol/L (ref 22–32)
Calcium: 8.7 mg/dL — ABNORMAL LOW (ref 8.9–10.3)
Chloride: 102 mmol/L (ref 98–111)
Creatinine, Ser: 0.57 mg/dL (ref 0.44–1.00)
GFR calc Af Amer: >60 mL/min (ref >60)
GFR calc non Af Amer: >60 mL/min (ref >60)
Glucose, Bld: 109 mg/dL — ABNORMAL HIGH (ref 70–99)
Potassium: 3.5 mmol/L (ref 3.5–5.1)
Sodium: 139 mmol/L (ref 135–145)
Total Bilirubin: 0.5 mg/dL (ref 0.3–1.2)
Total Protein: 6.8 g/dL (ref 6.5–8.1)

## 2019-06-26 LAB — LIPID PANEL
Cholesterol: 144 mg/dL (ref 0–200)
HDL: 37 mg/dL — ABNORMAL LOW (ref >40)
LDL Cholesterol: 82 mg/dL (ref 0–99)
Total CHOL/HDL Ratio: 3.9 RATIO
Triglycerides: 127 mg/dL (ref <150)
VLDL: 25 mg/dL (ref 0–40)

## 2019-06-26 LAB — CBC
HCT: 36.4 % (ref 36.0–46.0)
Hemoglobin: 11.7 g/dL — ABNORMAL LOW (ref 12.0–15.0)
MCH: 30.3 pg (ref 26.0–34.0)
MCHC: 32.1 g/dL (ref 30.0–36.0)
MCV: 94.3 fL (ref 80.0–100.0)
Platelets: 244 10*3/uL (ref 150–400)
RBC: 3.86 MIL/uL — ABNORMAL LOW (ref 3.87–5.11)
RDW: 13.2 % (ref 11.5–15.5)
WBC: 5.3 10*3/uL (ref 4.0–10.5)
nRBC: 0 % (ref 0.0–0.2)

## 2019-06-26 LAB — RESPIRATORY PANEL BY RT PCR (FLU A&B, COVID)
Influenza A by PCR: NEGATIVE
Influenza B by PCR: NEGATIVE
SARS Coronavirus 2 by RT PCR: NEGATIVE

## 2019-06-26 LAB — URINALYSIS, ROUTINE W REFLEX MICROSCOPIC
Bacteria, UA: NONE SEEN
Glucose, UA: NEGATIVE mg/dL
Hgb urine dipstick: NEGATIVE
Ketones, ur: NEGATIVE mg/dL
Nitrite: NEGATIVE
Protein, ur: 30 mg/dL — AB
Specific Gravity, Urine: 1.025 (ref 1.005–1.030)
pH: 5 (ref 5.0–8.0)

## 2019-06-26 LAB — ETHANOL: Alcohol, Ethyl (B): <10 mg/dL (ref <10)

## 2019-06-26 LAB — TSH: TSH: 1.052 u[IU]/mL (ref 0.350–4.500)

## 2019-06-26 LAB — PREGNANCY, URINE: Preg Test, Ur: NEGATIVE

## 2019-06-26 MED ORDER — OLANZAPINE 2.5 MG PO TABS
2.5000 mg | ORAL_TABLET | Freq: Every day | ORAL | Status: DC
  Administered 2019-06-27: 2.5 mg via ORAL
  Filled 2019-06-26: qty 1

## 2019-06-26 NOTE — Plan of Care (Signed)
[facility] Crisis Plan  Reason for Crisis Plan:  Crisis Stabilization and Substance Abuse   Plan of Care:  Referral for Inpatient Hospitalization and Referral for Substance Abuse  Family Support:   Mother and 110 hear old son   Current Living Environment:  Living Arrangements: Children("I have an apartment where I live with my baby")  Insurance:   Hospital Account    Name Acct ID Class Status Primary Coverage   Brailynn, Breth [id number] [place]        Guarantor Account (for Hospital Account [id number])    Name Relation to Pt Service Area Active? Acct Type   [name] Self [facility] Yes [facility]   Address Phone       [place] [place], [place] [address] [phone])          Coverage Information (for Hospital Account [id number])    F/O Payor/Plan Precert #   [facility] MEDICAID/[place] MEDICAID    Subscriber Subscriber #   [place], [place] [id number]   Address Phone   [address] [place], [place] [address] [phone]      Legal Guardian:  Legal Guardian: Other:(self)  Primary Care Provider:  [facility]  Current Outpatient Providers:  [facility] ([place]  Psychiatrist:  Name of Psychiatrist: ([facility]/methadone at [place])  Counselor/Therapist:  Name of Therapist: ([facility]/[place])  Compliant with Medications:  Yes  Additional Information: "[facility] took my 55 month old baby from me"    [place] [employer] 2/12/20215:13 PM

## 2019-06-26 NOTE — H&P (Signed)
[facility] Provider Admission PAA/H&P  Patient Identification: Kelly [name] MRN:  [id number] Date of Evaluation:  06/26/2019 Chief Complaint:  MDD (major depressive disorder), recurrent episode, severe ([place]) [F33.2] Principal Diagnosis: MDD (major depressive disorder), recurrent severe, without psychosis ([place]) Diagnosis:  Principal Problem:   MDD (major depressive disorder), recurrent severe, without psychosis ([place]) Active Problems:   Polysubstance abuse ([place])  History of Present Illness: Kelly [name], 34 y.o., female patient presented to [facility] as a walk in under IVC by her mother with complaints that patient danger to herself with history of congestive heart failure, methadone abuse; recently found passed in her room with 69 month old daughter on floor.  Child taken by DSS; patient then making statements of wanting to kill herself.  Patient seen via tele psych by this provider, consulted with Dr. Dwyane Dee; and chart reviewed on 06/26/19.  On evaluation Kelly [name] reports she was brought to the hospital "I guess because my mom thinking I'm going to kill myself cause DSS just took my baby; but I'm not.  I didn't tell her I was going to kill myself; I just said I feel like I'm going to die cause I was sad and upset that they took my daughter, what is a person suppose to feel like when somebody takes their child."  Patient states that she has been clean for almost a year and that she is currently in treatment at [facility] for methadone treatment "from where I was on heroin at one time.  When they said I failed a drug test in November, and December it must have been something wrong with the test; cause they told me at [facility] that it was like 0.1 something like I might have touched it or something but if I'd done it the levels would have been much higher."  This provider called [facility] for collateral on patient.  They reported that patient is not being treated there  for anything and they have no records of patient.  Patient states that she is currently living in a (rehab/halfway house) that allow child to stay there and works with parent and child.  At this time patient denies suicidal/self-harm/homicidal ideation, psychosis, and paranoia; but states that she has attempted suicide in the past at was hospitalized about 2 years ago.  Patient states that she has a prior diagnosis of major depression, bipolar disorder, and ADHD.  States that she was treated with amphetamine for ADHD but can't remember the name, Prozac for depression, and can't remember the names of "a lot" multiple medications she has been on in past.  Patient appears to be intoxicated or high during assessment with awkward gait and speech; but states that her gait is related to an abscess that had to be removed from spin and affected her gait.  Patient states last time she done any drugs was 2 days ago and that was cocaine.  States that he had her weekly visit at [facility] earlier today and got her prescription for methadone which she has also taken today.   During evaluation Kelly [name] is alert/oriented x 4; calm/cooperative; and mood labile; She does not appear to be responding to internal/external stimuli or delusional thoughts.  Patient denies suicidal/self-harm/homicidal ideation, psychosis, and paranoia.  Patient answered question appropriately but was untruthful related to methadone treatment.    Associated Signs/Symptoms: Depression Symptoms:  depressed mood, anxiety, (Hypo) Manic Symptoms:  Impulsivity, Irritable Mood, Labiality of Mood, Anxiety Symptoms:  Excessive  Worry, Psychotic Symptoms:  Denies PTSD Symptoms: No answer at this time Total Time spent with patient: 45 minutes  Past Psychiatric History: Polysubstance abuse, [place] depressive disorder, ADHD  Is the patient at risk to self? No.  Has the patient been a risk to self in the past 6 months? Yes.    Has the patient  been a risk to self within the distant past? Yes.    Is the patient a risk to others? No.  Has the patient been a risk to others in the past 6 months? No.  Has the patient been a risk to others within the distant past? No.   Prior Inpatient Therapy:   Prior Outpatient Therapy:    Alcohol Screening:   Substance Abuse History in the last 12 months:  Yes.   Consequences of Substance Abuse: Family Consequences:  Family discord, DSS taking custody of child Withdrawal Symptoms:   Diaphoresis Nausea Previous Psychotropic Medications: Yes  Psychological Evaluations: Yes  Past Medical History:  Past Medical History:  Diagnosis Date  . 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 Abscess of breast    rt breast  . ADD (attention deficit disorder)   . Back pain   . Bipolar 1 disorder ([place])   . Hypertension   . Leukocytosis   . Polysubstance abuse ([place])   . Pyelonephritis affecting pregnancy 05/24/2016  . Sepsis ([place]) 12/02/2017  . Suicidal overdose [facility])     Past Surgical History:  Procedure Laterality Date  . BACK SURGERY     scoliosis-in care everywhere 08/1999  . CESAREAN SECTION    . CHOLECYSTECTOMY  March 2015  . FOREIGN BODY REMOVAL Right 05/28/2016   Procedure: REMOVAL FOREIGN BODY RIGHT ARM;  Surgeon: Meredith Pel, MD;  Location: [facility];  Service: Orthopedics;  Laterality: Right;  . IRRIGATION AND DEBRIDEMENT ABSCESS Right 07/13/2014   Procedure: IRRIGATION AND DEBRIDEMEN TRIGHT BREAST ABSCESS;  Surgeon: Donnie Mesa, MD;  Location: [place];  Service: General;  Laterality: Right;  . LUMBAR LAMINECTOMY/DECOMPRESSION MICRODISCECTOMY N/A 12/05/2017   Procedure: LUMBAR LAMINECTOMY FOR EPIDURAL ABSCESS;  Surgeon: Newman Pies, MD;  Location: [place];  Service: Neurosurgery;  Laterality: N/A;  . MULTIPLE EXTRACTIONS WITH ALVEOLOPLASTY Bilateral 07/03/2018   Procedure: Extraction of tooth #'s 30 and 31 with alveoloplasty;  Surgeon: Lenn Cal, DDS;  Location:  [place];  Service: Oral Surgery;  Laterality: Bilateral;  . RADIOLOGY WITH ANESTHESIA N/A 12/05/2017   Procedure: MRI WITH ANESTHESIA OF [facility] AND LUMBAR SPINE WITH AND WITHOUT;  Surgeon: Radiologist, Medication, MD;  Location: [place];  Service: Radiology;  Laterality: N/A;  . RIGHT/LEFT HEART CATH AND CORONARY ANGIOGRAPHY N/A 05/21/2019   Procedure: RIGHT/LEFT HEART CATH AND CORONARY ANGIOGRAPHY;  Surgeon: Jolaine Artist, MD;  Location: [place] CV LAB;  Service: Cardiovascular;  Laterality: N/A;  . TEE WITHOUT CARDIOVERSION N/A 05/21/2019   Procedure: TRANSESOPHAGEAL ECHOCARDIOGRAM (TEE);  Surgeon: Jolaine Artist, MD;  Location: [name] ENDOSCOPY;  Service: Cardiovascular;  Laterality: N/A;   Family History:  Family History  Problem Relation Age of Onset  . Diabetes Mother   . Diabetes Father   . Hypertension Father   . Heart attack Neg Hx   . Hyperlipidemia Neg Hx   . Sudden death Neg Hx    Family Psychiatric History: Unaware Tobacco Screening:   Social History:  Social History   Substance and Sexual Activity  Alcohol Use Not Currently   Comment: one or two drinks weekly  Social History   Substance and Sexual Activity  Drug Use Yes  . Types: Cocaine, Heroin, IV   Comment: heroin, crack cocaine last used 06/13/2018    Additional Social History:                           Allergies:   Allergies  Allergen Reactions  . Hydrocodone Itching  . Morphine And Related Nausea And Vomiting   Lab Results: No results found for this or any previous visit (from the past 48 hour(s)).  Blood Alcohol level:  Lab Results  Component Value Date   ETH <10 11/21/2017   ETH <5 95/62/1308    Metabolic Disorder Labs:  Lab Results  Component Value Date   HGBA1C 5.3 09/04/2018   MPG 93.93 07/25/2018   MPG 94 05/24/2016   Lab Results  Component Value Date   PROLACTIN 22.1 01/01/2018   No results found for: CHOL, TRIG, HDL, CHOLHDL, VLDL, LDLCALC  Current  Medications: Current Facility-Administered Medications  Medication Dose Route Frequency Provider Last Rate Last Admin  . OLANZapine (ZYPREXA) tablet 2.5 mg  2.5 mg Oral QHS Tamiyah Moulin B, NP       PTA Medications: No medications prior to admission.    Musculoskeletal: Strength & Muscle Tone: within normal limits Gait & Station: broad based Patient leans: N/A  Psychiatric Specialty Exam: Physical Exam  Vitals reviewed. Constitutional: She is oriented to person, place, and time. She appears well-developed and well-nourished.  Respiratory: Effort normal.  Musculoskeletal:        General: Normal range of motion.     Cervical back: Normal range of motion.  Neurological: She is alert and oriented to person, place, and time.  Abnormal gait.    Skin: Skin is warm and dry.  Psychiatric: Her speech is normal and behavior is normal. Her mood appears anxious. Her affect is labile. Cognition and memory are normal. She expresses impulsivity. She exhibits a depressed mood. She expresses no homicidal and no suicidal ideation.    Review of Systems  Cardiovascular: Negative for chest pain and palpitations.       History of endocarditis   Neurological:       History of spinal abscess; after removal "I had to learn to walk all over again.  That's what's wrong with my gait"      Psychiatric/Behavioral: Hallucinations: Denies. Self-injury: Denies. Sleep disturbance: Denies. Suicidal ideas: Denies.       Patient reporting she is getting methadone treatment at [facility].  This provider call for collateral and was informed that patient is not being treated for anything at [facility].      Blood pressure 105/73, pulse 82, temperature 98.7 F (37.1 C), temperature source Oral, resp. rate (!) 24, SpO2 98 %.There is no height or weight on file to calculate BMI.  General Appearance: Casual  Eye Contact:  Good  Speech:  Clear and Coherent and Normal Rate  Volume:  Increased  Mood:  Anxious,  Depressed and Irritable  Affect:  Depressed, Labile and Tearful  Thought Process:  Coherent, Goal Directed and Descriptions of Associations: Intact  Orientation:  Full (Time, Place, and Person)  Thought Content:  Denies hallucinations, delusions, and paranoia  Suicidal Thoughts:  Denies suicidal ideation at this time  Homicidal Thoughts:  No  Memory:  Immediate;   Good Recent;   Good  Judgement:  Fair  Insight:  Lacking  Psychomotor Activity:  Normal  Concentration:  Concentration: Good  and Attention Span: Good  Recall:  Good  Fund of Knowledge:  Good  Language:  Good  Akathisia:  No  Handed:  Right  AIMS (if indicated):     Assets:  Communication Skills Desire for Improvement Housing Social Support  ADL's:  Intact  Cognition:  WNL  Sleep:      Treatment Plan Summary: Medication management and Plan Observation overnight; start medications and reassess in morning  Observation Level/Precautions:  15 minute checks Laboratory:  CBC Chemistry Profile HbAIC UDS UA Lipig panel, TSH, Drug screen, pregnancy, Covid-19 Psychotherapy:  Individual Medications:  Started Zyprexa 2.5 mg Q hs for major depressive disorder and Bipolar depressed mood Consultations:  As needed Discharge Concerns:  Safety, stabilization, and access to medication Estimated LOS:  Over night observation Other:      Maalik Pinn, NP 2/12/20211:18 PM
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
## 2019-06-26 NOTE — Progress Notes (Signed)
Patient transferred from 202-2 to room 401. Patient alert, oriented and ambulatory. Patient denies SI/HI and A/V hallucinations. Patient oriented to unit and room. Q 15 minute checks in progress and patient remains safe on unit. Monitoring continues.

## 2019-06-26 NOTE — Progress Notes (Signed)
Pt presents under IVC status accompanied by [employer] officers. Per IVC documents, patient is in danger to self and others. Found passed out on the floor in her room with her 66 month old daughter. Child taken into DSS custody and pt became suicidal. Pt observed with depressed affect and is tearful  on admission. Admits to history of CHF, heroine and cocaine. Reports she has a 59 y'o son who lives with her mother "but my mother said she can't keep my baby". Skin assessment done, belongings searched per protocol. Items deemed contraband secured in locker. Pt's skin is intact without open areas, tattoos noted to left hand and lower back. Edema noted at left ankle, pulses are palpable (+2pedal and dorsalis pedis). Tolerated all PO intake well when offered. Emotional support and availability offered to pt. Writer encouraged pt to voice concerns. Q 15 minutes safety checks initiated without outburst or self harm gestures. Pt in room asleep. Safety maintained.

## 2019-06-27 DIAGNOSIS — F332 Major depressive disorder, recurrent severe without psychotic features: Secondary | ICD-10-CM

## 2019-06-27 DIAGNOSIS — F314 Bipolar disorder, current episode depressed, severe, without psychotic features: Secondary | ICD-10-CM | POA: Diagnosis not present

## 2019-06-27 LAB — HEMOGLOBIN A1C
Hgb A1c MFr Bld: 5.1 % (ref 4.8–5.6)
Mean Plasma Glucose: 99.67 mg/dL

## 2019-06-27 MED ORDER — CLONIDINE HCL 0.1 MG PO TABS: 0.1000 mg | ORAL_TABLET | ORAL | Status: DC

## 2019-06-27 MED ORDER — CLONIDINE HCL 0.1 MG PO TABS: 0.1000 mg | ORAL_TABLET | Freq: Every day | ORAL | Status: DC

## 2019-06-27 MED ORDER — DICYCLOMINE HCL 20 MG PO TABS: 20.0000 mg | ORAL_TABLET | Freq: Four times a day (QID) | ORAL | Status: DC | PRN

## 2019-06-27 MED ORDER — CLONIDINE HCL 0.1 MG PO TABS
0.1000 mg | ORAL_TABLET | Freq: Four times a day (QID) | ORAL | Status: DC
  Administered 2019-06-27 (×3): 0.1 mg via ORAL
  Filled 2019-06-27 (×3): qty 1

## 2019-06-27 MED ORDER — LOPERAMIDE HCL 2 MG PO CAPS: 2.0000 mg | ORAL_CAPSULE | ORAL | Status: DC | PRN

## 2019-06-27 MED ORDER — NAPROXEN 250 MG PO TABS: 500.0000 mg | ORAL_TABLET | Freq: Two times a day (BID) | ORAL | Status: DC | PRN

## 2019-06-27 MED ORDER — ONDANSETRON 4 MG PO TBDP
4.0000 mg | ORAL_TABLET | Freq: Four times a day (QID) | ORAL | Status: DC | PRN
  Administered 2019-06-27: 18:00:00 4 mg via ORAL
  Filled 2019-06-27: qty 1

## 2019-06-27 MED ORDER — HYDROXYZINE HCL 25 MG PO TABS
25.0000 mg | ORAL_TABLET | Freq: Four times a day (QID) | ORAL | Status: DC | PRN
  Administered 2019-06-27 (×2): 25 mg via ORAL
  Filled 2019-06-27 (×2): qty 1

## 2019-06-27 MED ORDER — METHOCARBAMOL 500 MG PO TABS: 500.0000 mg | ORAL_TABLET | Freq: Three times a day (TID) | ORAL | Status: DC | PRN

## 2019-06-27 NOTE — Progress Notes (Addendum)
[facility] Observation Unit Progress Note  06/27/2019 11:44 AM ELANIE HAMMITT  MRN:  [id number] Subjective:    Patient states "I'm not sure why I'm here. I think my mother told them something. I just lost my daughter to DSS. I feel depressed. I have been off my psychiatric medications for depression for a long time."   Objective:   Per initial H & P by Earleen Newport NP on 06/26/2019:   History of Present Illness: [name], 34 y.o., female patient presented to [facility] as a walk in under IVC by her mother with complaints that patient danger to herself with history of congestive heart failure, methadone abuse; recently found passed in her room with 35 month old daughter on floor.  Child taken by DSS; patient then making statements of wanting to kill herself.  Patient seen via tele psych by this provider, consulted with Dr. Dwyane Dee; and chart reviewed on 06/26/19.  On evaluation Shravya N Broadhead reports she was brought to the hospital "I guess because my mom thinking I'm going to kill myself cause DSS just took my baby; but I'm not.  I didn't tell her I was going to kill myself; I just said I feel like I'm going to die cause I was sad and upset that they took my daughter, what is a person suppose to feel like when somebody takes their child."  Patient states that she has been clean for almost a year and that she is currently in treatment at [facility] for methadone treatment "from where I was on heroin at one time.  When they said I failed a drug test in November, and December it must have been something wrong with the test; cause they told me at [facility] that it was like 0.1 something like I might have touched it or something but if I'd done it the levels would have been much higher."  This provider called [facility] for collateral on patient.  They reported that patient is not being treated there for anything and they have no records of patient.  Patient states that she is currently living  in a (rehab/halfway house) that allow child to stay there and works with parent and child.  At this time patient denies suicidal/self-harm/homicidal ideation, psychosis, and paranoia; but states that she has attempted suicide in the past at was hospitalized about 2 years ago.  Patient states that she has a prior diagnosis of major depression, bipolar disorder, and ADHD.  States that she was treated with amphetamine for ADHD but can't remember the name, Prozac for depression, and can't remember the names of "a lot" multiple medications she has been on in past.  Patient appears to be intoxicated or high during assessment with awkward gait and speech; but states that her gait is related to an abscess that had to be removed from spin and affected her gait.  Patient states last time she done any drugs was 2 days ago and that was cocaine.  States that he had her weekly visit at [facility] earlier today and got her prescription for methadone which she has also taken today.      Patient seen and chart reviewed. The patient appears depressed during the assessment. She minimizes events that resulted in her mother taking out IVC. She does admit to a relapse on heroin while taking methadone for maintenance treatment.     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 12/05/2017   Abscess of breast    rt breast   ADD (attention deficit disorder)    Back pain    Bipolar 1 disorder (HCC)    Hypertension    Leukocytosis    Polysubstance abuse ([place])    Pyelonephritis affecting pregnancy 05/24/2016   Sepsis  ([place]) 12/02/2017   Suicidal overdose ([place])     Past Surgical History:  Procedure Laterality Date   BACK SURGERY     scoliosis-in care everywhere 08/1999   CESAREAN SECTION     CHOLECYSTECTOMY  March 2015   FOREIGN BODY REMOVAL Right 05/28/2016   Procedure: REMOVAL FOREIGN BODY RIGHT ARM;  Surgeon: Meredith Pel, MD;  Location: [facility];  Service: Orthopedics;  Laterality: Right;   IRRIGATION AND DEBRIDEMENT ABSCESS Right 07/13/2014   Procedure: IRRIGATION AND DEBRIDEMEN TRIGHT BREAST ABSCESS;  Surgeon: Donnie Mesa, MD;  Location: [facility];  Service: General;  Laterality: Right;   LUMBAR LAMINECTOMY/DECOMPRESSION MICRODISCECTOMY N/A 12/05/2017   Procedure: LUMBAR LAMINECTOMY FOR EPIDURAL ABSCESS;  Surgeon: Newman Pies, MD;  Location: [facility];  Service: Neurosurgery;  Laterality: N/A;   MULTIPLE EXTRACTIONS WITH ALVEOLOPLASTY Bilateral 07/03/2018   Procedure: Extraction of tooth #'s 30 and 31 with alveoloplasty;  Surgeon: Lenn Cal, DDS;  Location: [facility];  Service: Oral Surgery;  Laterality: Bilateral;   RADIOLOGY WITH ANESTHESIA N/A 12/05/2017   Procedure: MRI WITH ANESTHESIA OF [facility] AND LUMBAR SPINE WITH AND WITHOUT;  Surgeon: Radiologist, Medication, MD;  Location: [facility];  Service: Radiology;  Laterality: N/A;   RIGHT/LEFT HEART CATH AND CORONARY ANGIOGRAPHY N/A 05/21/2019   Procedure: RIGHT/LEFT HEART CATH AND CORONARY ANGIOGRAPHY;  Surgeon: Jolaine Artist, MD;  Location: [facility] CV LAB;  Service: Cardiovascular;  Laterality: N/A;   TEE WITHOUT CARDIOVERSION N/A 05/21/2019   Procedure: TRANSESOPHAGEAL ECHOCARDIOGRAM (TEE);  Surgeon: Jolaine Artist, MD;  Location: [facility] ENDOSCOPY;  Service: Cardiovascular;  Laterality: N/A;   Family History:  Family History  Problem Relation Age of Onset   Diabetes Mother    Diabetes Father    Hypertension Father    Heart attack Neg Hx    Hyperlipidemia Neg Hx    Sudden death Neg Hx    Family Psychiatric  History: see H & P Social  History:  Social History   Substance and Sexual Activity  Alcohol Use Not Currently   Comment: one or two drinks weekly     Social History   Substance and Sexual Activity  Drug Use Yes   Types: Cocaine, Heroin, IV   Comment: heroin, crack cocaine last used 06/13/2018    Social History   Socioeconomic History   Marital status: Single    Spouse name: Not on file   Number of children: Not on file   Years of education: Not on file   Highest education level: Not on file  Occupational History   Not on file  Tobacco Use   Smoking status: Former Smoker    Packs/day: 1.00    Years: 13.00    Pack years: 13.00    Types: Cigarettes    Quit date: 06/13/2018    Years since quitting: 1.0   Smokeless tobacco: Never Used  Substance and  Sexual Activity   Alcohol use: Not Currently    Comment: one or two drinks weekly   Drug use: Yes    Types: Cocaine, Heroin, IV    Comment: heroin, crack cocaine last used 06/13/2018   Sexual activity: Yes    Birth control/protection: Condom  Other Topics Concern   Not on file  Social History Narrative   Not on file   Social Determinants of Health   Financial Resource Strain:    Difficulty of Paying Living Expenses: Not on file  Food Insecurity:    Worried About Charity fundraiser in the Last Year: Not on file   [employer] of Food in the Last Year: Not on file  Transportation Needs:    Lack of Transportation (Medical): Not on file   Lack of Transportation (Non-Medical): Not on file  Physical Activity:    Days of Exercise per Week: Not on file   Minutes of Exercise per Session: Not on file  Stress:    Feeling of Stress : Not on file  Social Connections:    Frequency of Communication with Friends and Family: Not on file   Frequency of Social Gatherings with Friends and Family: Not on file   Attends Religious Services: Not on file   Active Member of [place] or Organizations: Not on file   Attends Archivist Meetings: Not on file    Marital Status: Not on file   Additional Social History:    Pain Medications: SEE MAR Prescriptions: SEE MAR Over the Counter: SEE MAR History of alcohol / drug use?: No history of alcohol / drug abuse Longest period of sobriety (when/how long): 1 year ("I was clean 1 yr 06/11/2019) Name of Substance 1: Heroin (Currently on Methadone 115 mg for abuse of Heroin. The Methadone is prescribed at [facility]) 1 - Age of First Use: 27 yrs ago 1 - Amount (size/oz): "I relapsed 06/24/19 and used $40 worth ...less than a 1/2 gram"; "When I was heavy in my addiction I would use 3-4 grams at a time" 1 - Frequency: "I relapsed 06/24/19 and used $40 worth after 1 year of sobriety"; Prior to sobriety she used daily. 1 - Duration: "I relapsed 06/24/19 and used $40 worth after 1 year of sobriety" 1 - Last Use / Amount: 06/24/2019; less than 1/2 a gram Name of Substance 2: Cocaine 2 - Age of First Use: 34 yrs old 2 - Amount (size/oz): $10 worth; "I relapsed on cocaine after 1 year of sobriety" 2 - Frequency: daily 2 - Duration: on-going 2 - Last Use / Amount: 06/26/2019                Sleep: Fair  Appetite:  Poor  Current Medications: Current Facility-Administered Medications  Medication Dose Route Frequency Provider Last Rate Last Admin   cloNIDine (CATAPRES) tablet 0.1 mg  0.1 mg Oral QID Niel Hummer, NP       Followed by   Derrill Memo ON 06/29/2019] cloNIDine (CATAPRES) tablet 0.1 mg  0.1 mg Oral Cline Cools A, NP       Followed by   Derrill Memo ON 07/02/2019] cloNIDine (CATAPRES) tablet 0.1 mg  0.1 mg Oral QAC breakfast Elmarie Shiley A, NP       dicyclomine (BENTYL) tablet 20 mg  20 mg Oral Q6H PRN Niel Hummer, NP       hydrOXYzine (ATARAX/VISTARIL) tablet 25 mg  25 mg Oral Q6H PRN Niel Hummer, NP  loperamide (IMODIUM) capsule 2-4 mg  2-4 mg Oral PRN Niel Hummer, NP       methocarbamol (ROBAXIN) tablet 500 mg  500 mg Oral Q8H PRN Niel Hummer, NP       naproxen (NAPROSYN)  tablet 500 mg  500 mg Oral BID PRN Niel Hummer, NP       OLANZapine (ZYPREXA) tablet 2.5 mg  2.5 mg Oral QHS Rankin, Shuvon B, NP       ondansetron (ZOFRAN-ODT) disintegrating tablet 4 mg  4 mg Oral Q6H PRN Niel Hummer, NP        Lab Results:  Results for orders placed or performed during the hospital encounter of 06/26/19 (from the past 48 hour(s))  Respiratory Panel by RT PCR (Flu A&B, Covid) - Nasopharyngeal Swab     Status: None   Collection Time: 06/26/19 12:53 PM   Specimen: Nasopharyngeal Swab  Result Value Ref Range   SARS Coronavirus 2 by RT PCR NEGATIVE NEGATIVE    Comment: (NOTE) SARS-CoV-2 target nucleic acids are NOT DETECTED. The SARS-CoV-2 RNA is generally detectable in upper respiratoy specimens during the acute phase of infection. The lowest concentration of SARS-CoV-2 viral copies this assay can detect is 131 copies/mL. A negative result does not preclude SARS-Cov-2 infection and should not be used as the sole basis for treatment or other patient management decisions. A negative result may occur with  improper specimen collection/handling, submission of specimen other than nasopharyngeal swab, presence of viral mutation(s) within the areas targeted by this assay, and inadequate number of viral copies (<131 copies/mL). A negative result must be combined with clinical observations, patient history, and epidemiological information. The expected result is Negative. Fact Sheet for Patients:  PinkCheek.be Fact Sheet for Healthcare Providers:  GravelBags.it This test is not yet ap proved or cleared by the [place] FDA and  has been authorized for detection and/or diagnosis of SARS-CoV-2 by FDA under an Emergency Use Authorization (EUA). This EUA will remain  in effect (meaning this test can be used) for the duration of the COVID-19 declaration under Section 564(b)(1) of the Act, 21 U.S.C. section  360bbb-3(b)(1), unless the authorization is terminated or revoked sooner.    Influenza A by PCR NEGATIVE NEGATIVE   Influenza B by PCR NEGATIVE NEGATIVE    Comment: (NOTE) The Xpert Xpress SARS-CoV-2/FLU/RSV assay is intended as an aid in  the diagnosis of influenza from Nasopharyngeal swab specimens and  should not be used as a sole basis for treatment. Nasal washings and  aspirates are unacceptable for Xpert Xpress SARS-CoV-2/FLU/RSV  testing. Fact Sheet for Patients: PinkCheek.be Fact Sheet for Healthcare Providers: GravelBags.it This test is not yet approved or cleared by the [place] FDA and  has been authorized for detection and/or diagnosis of SARS-CoV-2 by  FDA under an Emergency Use Authorization (EUA). This EUA will remain  in effect (meaning this test can be used) for the duration of the  Covid-19 declaration under Section 564(b)(1) of the Act, 21  U.S.C. section 360bbb-3(b)(1), unless the authorization is  terminated or revoked. Performed at [facility], [place] [address], [place], [place] [address]   Urinalysis, Routine w reflex microscopic     Status: Abnormal   Collection Time: 06/26/19  1:06 PM  Result Value Ref Range   Color, Urine AMBER (A) YELLOW    Comment: BIOCHEMICALS MAY BE AFFECTED BY COLOR   APPearance CLOUDY (A) CLEAR   Specific Gravity, Urine 1.025 1.005 - 1.030  pH 5.0 5.0 - 8.0   Glucose, UA NEGATIVE NEGATIVE mg/dL   Hgb urine dipstick NEGATIVE NEGATIVE   Bilirubin Urine SMALL (A) NEGATIVE   Ketones, ur NEGATIVE NEGATIVE mg/dL   Protein, ur 30 (A) NEGATIVE mg/dL   Nitrite NEGATIVE NEGATIVE   Leukocytes,Ua SMALL (A) NEGATIVE   RBC / HPF 6-10 0 - 5 RBC/hpf   WBC, UA 6-10 0 - 5 WBC/hpf   Bacteria, UA NONE SEEN NONE SEEN   Squamous Epithelial / LPF 6-10 0 - 5   Mucus PRESENT    Ca Oxalate Crys, UA PRESENT     Comment: Performed at [facility], [place] [address] [place], [place] [address]  Pregnancy, urine     Status: None   Collection Time: 06/26/19  1:06 PM  Result Value Ref Range   Preg Test, Ur NEGATIVE NEGATIVE    Comment:        THE SENSITIVITY OF THIS METHODOLOGY IS >20 mIU/mL. Performed at [facility], [place] [address], [place], [place] [address]   CBC     Status: Abnormal   Collection Time: 06/26/19  6:12 PM  Result Value Ref Range   WBC 5.3 4.0 - 10.5 K/uL   RBC 3.86 (L) 3.87 - 5.11 MIL/uL   Hemoglobin 11.7 (L) 12.0 - 15.0 g/dL   HCT 36.4 36.0 - 46.0 %   MCV 94.3 80.0 - 100.0 fL   MCH 30.3 26.0 - 34.0 pg   MCHC 32.1 30.0 - 36.0 g/dL   RDW 13.2 11.5 - 15.5 %   Platelets 244 150 - 400 K/uL   nRBC 0.0 0.0 - 0.2 %    Comment: Performed at [facility], [place] [address], [place], [place] [address]  Comprehensive metabolic panel     Status: Abnormal   Collection Time: 06/26/19  6:12 PM  Result Value Ref Range   Sodium 139 135 - 145 mmol/L   Potassium 3.5 3.5 - 5.1 mmol/L   Chloride 102 98 - 111 mmol/L   CO2 30 22 - 32 mmol/L   Glucose, Bld 109 (H) 70 - 99 mg/dL   BUN 7 6 - 20 mg/dL   Creatinine, Ser 0.57 0.44 - 1.00 mg/dL   Calcium 8.7 (L) 8.9 - 10.3 mg/dL   Total Protein 6.8 6.5 - 8.1 g/dL   Albumin 3.4 (L) 3.5 - 5.0 g/dL   AST 21 15 - 41 U/L   ALT 10 0 - 44 U/L   Alkaline Phosphatase 96 38 - 126 U/L   Total Bilirubin 0.5 0.3 - 1.2 mg/dL   GFR calc non Af Amer >60 >60 mL/min   GFR calc Af Amer >60 >60 mL/min   Anion gap 7 5 - 15    Comment: Performed at [facility], [place] [address], [place], [place] [address]  Hemoglobin A1c     Status: None   Collection Time: 06/26/19  6:12 PM  Result Value Ref Range   Hgb A1c MFr Bld 5.1 4.8 - 5.6 %    Comment: (NOTE) Pre diabetes:          5.7%-6.4% Diabetes:              >6.4% Glycemic control for   <7.0% adults with diabetes    Mean Plasma Glucose 99.67 mg/dL    Comment: Performed at [facility] [address], [place], [place] [address]  Ethanol     Status: None   Collection  Time: 06/26/19  6:12 PM  Result Value Ref Range   Alcohol, Ethyl (B) <10 <10 mg/dL    Comment: (NOTE) Lowest detectable limit for serum alcohol is 10 mg/dL. For medical purposes only. Performed at [facility], [place] [address], [place], [place] [address]   Lipid panel     Status: Abnormal   Collection Time: 06/26/19  6:12 PM  Result Value Ref Range   Cholesterol 144 0 - 200 mg/dL   Triglycerides 127 <150 mg/dL   HDL 37 (L) >40 mg/dL   Total CHOL/HDL Ratio 3.9 RATIO   VLDL 25 0 - 40 mg/dL   LDL Cholesterol 82 0 - 99 mg/dL    Comment:        Total Cholesterol/HDL:CHD Risk Coronary Heart Disease Risk Table                     Men   Women  1/2 Average Risk   3.4   3.3  Average Risk       5.0   4.4  2 X Average Risk   9.6   7.1  3 X Average Risk  23.4   11.0        Use the calculated Patient Ratio above and the CHD Risk Table to determine the patient's CHD Risk.        ATP III CLASSIFICATION (LDL):  <100     mg/dL   Optimal  100-129  mg/dL   Near or Above                    Optimal  130-159  mg/dL   Borderline  160-189  mg/dL   High  >190     mg/dL   Very High Performed at [facility] [address] [place], [place] [address]   TSH     Status: None   Collection Time: 06/26/19  6:12 PM  Result Value Ref Range   TSH 1.052 0.350 - 4.500 uIU/mL    Comment: Performed by a 3rd Generation assay with a functional sensitivity of <=0.01 uIU/mL. Performed at [facility], [place] [address], [place],  [address]     Blood Alcohol level:  Lab Results  Component Value Date   ETH <10 06/26/2019   ETH <10 05/06/8249    Metabolic Disorder Labs: Lab Results  Component Value Date   HGBA1C 5.1 06/26/2019   MPG 99.67 06/26/2019   MPG 93.93 07/25/2018   Lab Results  Component Value Date   PROLACTIN 22.1 01/01/2018   Lab Results   Component Value Date   CHOL 144 06/26/2019   TRIG 127 06/26/2019   HDL 37 (L) 06/26/2019   CHOLHDL 3.9 06/26/2019   VLDL 25 06/26/2019   LDLCALC 82 06/26/2019    Physical Findings: AIMS:  , ,  ,  ,    CIWA:    COWS:     Musculoskeletal: Strength & Muscle Tone: within normal limits Gait & Station: normal Patient leans: N/A  Psychiatric Specialty Exam: Physical Exam  Constitutional: She appears well-developed and well-nourished.  Psychiatric: Her speech is normal. She is withdrawn. Cognition and memory are normal. She expresses inappropriate judgment. She exhibits a depressed mood.    Review of Systems  Blood pressure 104/73, pulse 60, temperature 98.7 F (37.1 C), temperature source Oral, resp. rate 18, SpO2 100 %.There is no height or weight on file to calculate BMI.  General Appearance: Disheveled  Eye Contact:  Minimal  Speech:  Clear and Coherent  Volume:  Decreased  Mood:  Depressed  Affect:  Flat  Thought Process:  Coherent  Orientation:  Full (Time, Place, and Person)  Thought Content:   Depressive symptoms, recent loss of child   Suicidal Thoughts:  No  Homicidal Thoughts:  No  Memory:  Immediate;   Fair Recent;   Poor Remote;   Fair  Judgement:  Poor  Insight:  Shallow  Psychomotor Activity:  Decreased  Concentration:  Concentration: Poor and Attention Span: Poor  Recall:  [employer] of Knowledge:  Fair  Language:  Fair  Akathisia:  No  Handed:  Right  AIMS (if indicated):     Assets:  Communication Skills Desire for Improvement Leisure Time Physical Health Resilience Social Support Talents/Skills  ADL's:  Intact  Cognition:  WNL  Sleep:        Treatment Plan Summary: Daily contact with patient to assess and evaluate symptoms and progress in treatment, Medication management to continue low dose zyprexa 2.5 mg at hs for mood stabilization. Initiate clonidine protocol as patient tested positive for opiates on drug screen. Discussed patient's  request to have Methadone 115 mg continued. However, patient has recently been using illicit drugs. Will continue to monitor patient in the Observation Unit overnight to be re-assessed tomorrow morning.   Elmarie Shiley, NP 06/27/2019, 11:44 AM Patient seen face-to-face for psychiatric evaluation, chart reviewed and case discussed with the physician extender and developed treatment plan. Reviewed the information documented and agree with the treatment plan. Corena Pilgrim, MD

## 2019-06-27 NOTE — Progress Notes (Signed)
Patient is alert, oriented and ambulatory. Patient is in no distress. Patient denies SI/HI and A/V hallucinations. Patient provided support and encouragement. Q 15 minute checks in progress and patient remains safe on unit. Monitoring continues.

## 2019-06-27 NOTE — Progress Notes (Signed)
Patient ID: Kelly [name], female   DOB: [birth year], 34 y.o.   MRN: [id number] Pt A&O x 4, no acute distress noted, anxious and cooperative, denies SI or HI, no AVH noted.  Monitoring for safety.

## 2019-06-27 NOTE — Progress Notes (Signed)
   06/26/19 2024  Psych Admission Type (Psych Patients Only)  Admission Status Involuntary  Psychosocial Assessment  Patient Complaints Anxiety  Eye Contact Fair  Facial Expression Sullen  Affect Depressed  Speech Logical/coherent  Interaction Assertive  Motor Activity Fidgety;Restless  Appearance/Hygiene Disheveled  Behavior Characteristics Cooperative;Calm  Mood Pleasant  Aggressive Behavior  Targets Self  Type of Behavior Verbal;Threatening  Effect No apparent injury  Thought Process  Coherency WDL  Content Blaming self;Blaming others  Delusions None reported or observed  Perception WDL  Hallucination None reported or observed  Judgment Poor  Confusion None  Danger to Self  Current suicidal ideation? Denies  Danger to Others  Danger to Others None reported or observed

## 2019-06-28 DIAGNOSIS — F314 Bipolar disorder, current episode depressed, severe, without psychotic features: Secondary | ICD-10-CM | POA: Diagnosis not present

## 2019-06-28 DIAGNOSIS — F332 Major depressive disorder, recurrent severe without psychotic features: Secondary | ICD-10-CM | POA: Diagnosis not present

## 2019-06-28 MED ORDER — OLANZAPINE 2.5 MG PO TABS: 2.5000 mg | ORAL_TABLET | Freq: Every day | ORAL | 0 refills | Status: DC

## 2019-06-28 NOTE — Discharge Summary (Addendum)
Physician Discharge Summary Note  Patient:  Kelly [name] is an 34 y.o., female MRN:  [id number] DOB:  [birth year] Patient phone:  [phone] (home)  Patient address:   [address] [address],  Total Time spent with patient: 15 minutes  Date of Admission:  06/26/2019 Date of Discharge: 06/28/2019  Reason for Admission:  Per admission assessment: Kelly [name],33 y.o.,femalepatient presented to [facility] as a walk in under IVC by her mother with complaints that patient danger to herself with history of congestive heart failure, methadone abuse; recently found passed in her room with 74 month old daughter on floor. Child taken by DSS; patient then making statements of wanting to kill herself. Patientseen via tele psych by this provider, consulted withDr.Kumar; and chart reviewed on02/12/21. On evaluation Kelly N Reynoldsreports she was brought to the hospital "I guess because my mom thinking I'm going to kill myself cause DSS just took my baby; but I'm not. I didn't tell her I was going to kill myself; I just said I feel like I'm going to die cause I was sad and upset that they took my daughter, what is a person suppose to feel like when somebody takes their child." Patient states that she has been clean for almost a year and that she is currently in treatment at [facility] for methadone treatment "from where I was on heroin at one time. When they said I failed a drug test in November, and December it must have been something wrong with the test; cause they told me at [facility] that it was like 0.1 something like I might have touched it or something but if I'd done it the levels would have been much higher." This provider called [facility] for collateral on patient. They reported that patient is not being treated there for anything and they have no records of patient. Patient states that she is currently living in a (rehab/halfway house) that allow child to  stay there and works with parent and child. At this time patient denies suicidal/self-harm/homicidal ideation, psychosis, and paranoia; but states that she has attempted suicide in the past at was hospitalized about 2 years ago. Patient states that she has a prior diagnosis of major depression, bipolar disorder, and ADHD. States that she was treated with amphetamine for ADHD but can't remember the name, Prozac for depression, and can't remember the names of "a lot" multiple medications she has been on in past. Patient appears to be intoxicated or high during assessment with awkward gait and speech; but states that her gait is related to an abscess that had to be removed from spin and affected her gait. Patient states last time she done any drugs was 2 days ago and that was cocaine. States that he had her weekly visit at [facility] earlier today and got her prescription for methadone which she has also taken today.  Evaluation: Wilene was seen and evaluated by attending psychiatrist Mallie Darting and nurse practitioner T. Anabeth Chilcott.  She is awake, alert and oriented x3.  Denying suicidal or homicidal ideations.  Denies auditory or visual hallucinations.  Patient validates information provided in the above assessment.  States after her children were taking by DSS she states she" felt like she wanted to die" she is denying plan or intent to kill herself.  Does admit to depression.  Denies history of suicide attempts in the past.  Patient appears to be focused on methadone as she reports she was prescribed 115 methadone daily  and has not had her dose in the past 2 days.  States she attends a methadone clinic weekly at [facility].   Patient to be followed by peers support and possibly social work for additional outpatient resources for homeless shelters.  Support, encouragement and reassurance was provided.  Principal Problem: MDD (major depressive disorder), recurrent severe, without psychosis  ([place]) Discharge Diagnoses: Principal Problem:   MDD (major depressive disorder), recurrent severe, without psychosis ([place]) Active Problems:   Polysubstance abuse ([place])   Past Psychiatric History:   Past Medical History:  Past Medical History:  Diagnosis Date  . 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 Abscess of breast    rt breast  . ADD (attention deficit disorder)   . Back pain   . Bipolar 1 disorder ([place])   . Hypertension   . Leukocytosis   . Polysubstance abuse ([place])   . Pyelonephritis affecting pregnancy 05/24/2016  . Sepsis ([place]) 12/02/2017  . Suicidal overdose [facility])     Past Surgical History:  Procedure Laterality Date  . BACK SURGERY     scoliosis-in care everywhere 08/1999  . CESAREAN SECTION    . CHOLECYSTECTOMY  March 2015  . FOREIGN BODY REMOVAL Right 05/28/2016   Procedure: REMOVAL FOREIGN BODY RIGHT ARM;  Surgeon: Meredith Pel, MD;  Location: [facility];  Service: Orthopedics;  Laterality: Right;  . IRRIGATION AND DEBRIDEMENT ABSCESS Right 07/13/2014   Procedure: IRRIGATION AND DEBRIDEMEN TRIGHT BREAST ABSCESS;  Surgeon: Donnie Mesa, MD;  Location: [facility];  Service: General;  Laterality: Right;  . LUMBAR LAMINECTOMY/DECOMPRESSION MICRODISCECTOMY N/A 12/05/2017   Procedure: LUMBAR LAMINECTOMY FOR EPIDURAL ABSCESS;  Surgeon: Newman Pies, MD;  Location: [facility];  Service: Neurosurgery;  Laterality: N/A;  . MULTIPLE EXTRACTIONS WITH ALVEOLOPLASTY Bilateral 07/03/2018   Procedure: Extraction of tooth #'s 30 and 31 with alveoloplasty;  Surgeon: Lenn Cal, DDS;  Location: [facility];  Service: Oral Surgery;  Laterality: Bilateral;  . RADIOLOGY WITH ANESTHESIA N/A 12/05/2017   Procedure: MRI WITH ANESTHESIA OF [facility] AND LUMBAR SPINE WITH AND WITHOUT;  Surgeon: Radiologist, Medication, MD;  Location: [facility];  Service: Radiology;  Laterality: N/A;  . RIGHT/LEFT HEART CATH AND CORONARY ANGIOGRAPHY N/A 05/21/2019   Procedure: RIGHT/LEFT HEART CATH AND  CORONARY ANGIOGRAPHY;  Surgeon: Jolaine Artist, MD;  Location: [facility];  Service: Cardiovascular;  Laterality: N/A;  . TEE WITHOUT CARDIOVERSION N/A 05/21/2019   Procedure: TRANSESOPHAGEAL ECHOCARDIOGRAM (TEE);  Surgeon: Jolaine Artist, MD;  Location: [facility] ENDOSCOPY;  Service: Cardiovascular;  Laterality: N/A;   Family History:  Family History  Problem Relation Age of Onset  . Diabetes Mother   . Diabetes Father   . Hypertension Father   . Heart attack Neg Hx   . Hyperlipidemia Neg Hx   . Sudden death Neg Hx    Family Psychiatric  History:  Social History:  Social History   Substance and Sexual Activity  Alcohol Use Not Currently   Comment: one or two drinks weekly     Social History   Substance and Sexual Activity  Drug Use Yes  . Types: Cocaine, Heroin, IV   Comment: heroin, crack cocaine last used 06/13/2018    Social History   Socioeconomic History  . Marital status: Single    Spouse name: Not on file  . Number of children: Not on file  . Years of education: Not on file  . Highest education level: Not on file  Occupational History  .  Not on file  Tobacco Use  . Smoking status: Former Smoker    Packs/day: 1.00    Years: 13.00    Pack years: 13.00    Types: Cigarettes    Quit date: 06/13/2018    Years since quitting: 1.0  . Smokeless tobacco: Never Used  Substance and Sexual Activity  . Alcohol use: Not Currently    Comment: one or two drinks weekly  . Drug use: Yes    Types: Cocaine, Heroin, IV    Comment: heroin, crack cocaine last used 06/13/2018  . Sexual activity: Yes    Birth control/protection: Condom  Other Topics Concern  . Not on file  Social History Narrative  . Not on file   Social Determinants of Health   Financial Resource Strain:   . Difficulty of Paying Living Expenses: Not on file  Food Insecurity:   . Worried About Charity fundraiser in the Last Year: Not on file  . Ran Out of Food in the Last Year: Not on file   Transportation Needs:   . Lack of Transportation (Medical): Not on file  . Lack of Transportation (Non-Medical): Not on file  Physical Activity:   . Days of Exercise per Week: Not on file  . Minutes of Exercise per Session: Not on file  Stress:   . Feeling of Stress : Not on file  Social Connections:   . Frequency of Communication with Friends and Family: Not on file  . Frequency of Social Gatherings with Friends and Family: Not on file  . Attends Religious Services: Not on file  . Active Member of Clubs or Organizations: Not on file  . Attends Archivist Meetings: Not on file  . Marital Status: Not on file    Hospital Course:  Dalasia N Zarzycki was admitted for MDD (major depressive disorder), recurrent severe, without psychosis ([place])  and crisis management.  Pt was treated discharged with the medications listed below under Medication List.  Medical problems were identified and treated as needed.  Home medications were restarted as appropriate.  Improvement was monitored by observation and [employer] 's daily report of symptom reduction.  Emotional and mental status was monitored by daily self-inventory reports completed by [name] and clinical staff.         Amirra N Tsang was evaluated by the treatment team for stability and plans for continued recovery upon discharge. Keeleigh N Newland 's motivation was an integral factor for scheduling further treatment. Employment, transportation, bed availability, health status, family support, and any pending legal issues were also considered during hospital stay. Pt was offered further treatment options upon discharge including but not limited to Residential, Intensive Outpatient, and Outpatient treatment.  Naleah N Devera will follow up with the services as listed below under Follow Up Information.     Upon completion of this admission the patient was both mentally and medically stable for discharge denying  suicidal/homicidal ideation, auditory/visual/tactile hallucinations, delusional thoughts and paranoia.    Idelia N Spagnoli responded well to treatment with Zyprex 2.49m nightly and clonidine protoco without adverse effects.  Reported generalized aches due to being off her methadone i.e. headaches, nausea and mood irritability. pt demonstrated improvement without reported or observed adverse effects to the point of stability appropriate for outpatient management. Pertinent labs include: for which outpatient follow-up is necessary for lab recheck as mentioned below. Reviewed CBC, CMP, BAL, and UDS; all unremarkable aside from noted exceptions.   Physical Findings: AIMS: Facial  and Oral Movements Muscles of Facial Expression: None, normal Lips and Perioral Area: None, normal Jaw: None, normal Tongue: None, normal,Extremity Movements Upper (arms, wrists, hands, fingers): None, normal Lower (legs, knees, ankles, toes): None, normal, Trunk Movements Neck, shoulders, hips: None, normal, Overall Severity Severity of abnormal movements (highest score from questions above): None, normal Incapacitation due to abnormal movements: None, normal Patient's awareness of abnormal movements (rate only patient's report): No Awareness, Dental Status Current problems with teeth and/or dentures?: No Does patient usually wear dentures?: No  CIWA:  CIWA-Ar Total: 7 COWS:  COWS Total Score: 3  Musculoskeletal: Strength & Muscle Tone: within normal limits Gait & Station: normal Patient leans: N/A  Psychiatric Specialty Exam: Physical Exam  Vitals reviewed. Constitutional: She appears well-developed.  Psychiatric: She has a normal mood and affect. Her behavior is normal.    Review of Systems  Psychiatric/Behavioral: Positive for agitation and sleep disturbance. Negative for suicidal ideas. The patient is nervous/anxious.     Blood pressure 108/63, pulse 66, temperature 98.7 F (37.1 C), temperature source  Oral, resp. rate 20, SpO2 100 %.There is no height or weight on file to calculate BMI.  General Appearance: Casual  Eye Contact:  Fair  Speech:  Clear and Coherent  Volume:  Normal  Mood:  Anxious and Depressed  Affect:  Congruent  Thought Process:  Coherent  Orientation:  Full (Time, Place, and Person)  Thought Content:  Logical  Suicidal Thoughts:  No  Homicidal Thoughts:  No  Memory:  Immediate;   Fair Recent;   Fair  Judgement:  Fair  Insight:  Fair  Psychomotor Activity:  Normal  Concentration:  Concentration: Fair  Recall:  [place] of Knowledge:  Fair  Language:  Fair  Akathisia:  No  Handed:  Right  AIMS (if indicated):     Assets:  Communication Skills Desire for Improvement Social Support Talents/Skills  ADL's:  Intact  Cognition:  WNL  Sleep:           Has this patient used any form of tobacco in the last 30 days? (Cigarettes, Smokeless Tobacco, Cigars, and/or Pipes) Yes, Yes, A prescription for an FDA-approved tobacco cessation medication was offered at discharge and the patient refused  Blood Alcohol level:  Lab Results  Component Value Date   [facility] <10 06/26/2019   ETH <10 22/97/9892    Metabolic Disorder Labs:  Lab Results  Component Value Date   HGBA1C 5.1 06/26/2019   MPG 99.67 06/26/2019   MPG 93.93 07/25/2018   Lab Results  Component Value Date   PROLACTIN 22.1 01/01/2018   Lab Results  Component Value Date   CHOL 144 06/26/2019   TRIG 127 06/26/2019   HDL 37 (L) 06/26/2019   CHOLHDL 3.9 06/26/2019   VLDL 25 06/26/2019   LDLCALC 82 06/26/2019    See Psychiatric Specialty Exam and Suicide Risk Assessment completed by Attending Physician prior to discharge.  Discharge destination:  Home  Is patient on multiple antipsychotic therapies at discharge:  No   Has Patient had three or more failed trials of antipsychotic monotherapy by history:  No  Recommended Plan for Multiple Antipsychotic Therapies: NA  Discharge Instructions     Diet - low sodium heart healthy   Complete by: As directed    Increase activity slowly   Complete by: As directed      Allergies as of 06/28/2019      Reactions   Hydrocodone Itching   Morphine And Related Nausea And Vomiting  Medication List    STOP taking these medications   acetaminophen 325 MG tablet Commonly known as: TYLENOL   ibuprofen 400 MG tablet Commonly known as: ADVIL   methadone 10 MG/ML solution Commonly known as: DOLOPHINE     TAKE these medications     Indication  OLANZapine 2.5 MG tablet Commonly known as: ZYPREXA Take 1 tablet (2.5 mg total) by mouth at bedtime.  Indication: Depressive Phase of Manic-Depression, Major Depressive Disorder        Follow-up recommendations:  Activity:  as tolerated Diet:  heart healthy  Comments:  Take all medications as prescribed. Keep all follow-up appointments as scheduled.  Do not consume alcohol or use illegal drugs while on prescription medications. Report any adverse effects from your medications to your primary care provider promptly.  In the event of recurrent symptoms or worsening symptoms, call 911, a crisis hotline, or go to the nearest emergency department for evaluation.   Signed: Derrill Center, NP 06/28/2019, 10:04 AM

## 2019-06-28 NOTE — Progress Notes (Signed)
Discharge note  Patient verbalizes readiness for discharge. Follow up plan explained, AVS given. Prescriptions and teaching provided. Belongings returned and signed for. Patient verbalizes understanding. Patient denies SI/HI and assures this Probation officer they will seek assistance should that change. Patient discharged to lobby with bus information.

## 2019-06-28 NOTE — [facility] Suicide Risk Assessment (Signed)
Suicide Risk Assessment  Discharge Assessment   [name] Discharge Suicide Risk Assessment   Principal Problem: MDD (major depressive disorder), recurrent severe, without psychosis ([place]) Discharge Diagnoses: Principal Problem:   MDD (major depressive disorder), recurrent severe, without psychosis ([place]) Active Problems:   Polysubstance abuse ([place])   Total Time spent with patient: 15 minutes  Musculoskeletal: Strength & Muscle Tone: within normal limits Gait & Station: normal Patient leans: N/A  Psychiatric Specialty Exam:   Blood pressure 108/63, pulse 66, temperature 98.7 F (37.1 C), temperature source Oral, resp. rate 20, SpO2 100 %.There is no height or weight on file to calculate BMI.  General Appearance: Disheveled  Eye Sport and exercise psychologist::  Fair  Speech:  Clear and [id number]  Volume:  Normal  Mood:  Anxious and Irritable  Affect:  Congruent  Thought Process:  Coherent  Orientation:  Full (Time, Place, and Person)  Thought Content:  Logical  Suicidal Thoughts:  No  Homicidal Thoughts:  No  Memory:  Immediate;   Fair Recent;   Fair  Judgement:  Fair  Insight:  Fair  Psychomotor Activity:  Normal  Concentration:  Fair  Recall:  [employer] of Knowledge:Fair  Language: Fair  Akathisia:  No  Handed:  Right  AIMS (if indicated):     Assets:  Communication Skills Desire for Improvement Resilience Social Support  Sleep:     Cognition: WNL  ADL's:  Intact   Mental Status Per Nursing Assessment::   On Admission:  Suicidal ideation indicated by patient, Self-harm thoughts  Demographic Factors:  Caucasian and Low socioeconomic status  Loss Factors: Loss of significant relationship and Financial problems/change in socioeconomic status  Historical Factors: Impulsivity  Risk Reduction Factors:   Responsible for children under 55 years of age  Continued Clinical Symptoms:  Depression:   Anhedonia Alcohol/Substance Abuse/Dependencies  Cognitive Features That Contribute  To Risk:  Closed-mindedness    Suicide Risk:  Minimal: No identifiable suicidal ideation.  Patients presenting with no risk factors but with morbid ruminations; may be classified as minimal risk based on the severity of the depressive symptoms    Plan Of Care/Follow-up recommendations:  Activity:  as tolerated Diet:  heart healthy  Derrill Center, NP 06/28/2019, 10:00 AM

## 2019-07-02 ENCOUNTER — Encounter: Payer: Medicaid Other | Admitting: Cardiothoracic Surgery

## 2019-07-06 LAB — COCAINE,MS,WB/SP RFX
Benzoylecgonine: 25 ng/mL
Cocaine Confirmation: POSITIVE
Cocaine: NEGATIVE ng/mL

## 2019-07-07 LAB — DRUG SCREEN 10 W/CONF, SERUM
Amphetamines, IA: NEGATIVE ng/mL
Barbiturates, IA: NEGATIVE ug/mL
Benzodiazepines, IA: NEGATIVE ng/mL
Cocaine & Metabolite, IA: POSITIVE ng/mL — AB
Methadone, IA: POSITIVE ng/mL — AB
Opiates, IA: NEGATIVE ng/mL
Oxycodones, IA: NEGATIVE ng/mL
Phencyclidine, IA: NEGATIVE ng/mL
Propoxyphene, IA: NEGATIVE ng/mL
THC(Marijuana) Metabolite, IA: NEGATIVE ng/mL

## 2019-07-07 LAB — METHADONE,MS,WB/SP RFX
Methadone Confirmation: POSITIVE
Methadone: 399.7 ng/mL

## 2019-07-23 ENCOUNTER — Institutional Professional Consult (permissible substitution) (INDEPENDENT_AMBULATORY_CARE_PROVIDER_SITE_OTHER): Payer: Medicaid Other | Admitting: Cardiothoracic Surgery

## 2019-07-23 ENCOUNTER — Other Ambulatory Visit: Payer: Self-pay

## 2019-07-23 VITALS — BP 132/90 | HR 79 | Temp 98.2°F | Resp 20 | Ht 67.5 in | Wt 177.0 lb

## 2019-07-23 DIAGNOSIS — I361 Nonrheumatic tricuspid (valve) insufficiency: Secondary | ICD-10-CM | POA: Diagnosis not present

## 2019-07-23 NOTE — Progress Notes (Signed)
[address]       [place] [address]             [phone]                    Kelly [name] [place] Medical Record #[id number] Date of Birth: Dec 01, 1985  Referring: Jolaine Artist, MD Primary Care: [facility] Primary Cardiologist: No primary care provider on file.  Chief Complaint:    Chief Complaint  Patient presents with  . Tricuspid Regurgitation    Surgical consultation    History of Present Illness:    Kelly [name] 34 y.o. female is seen in the office  today for consideration of tricuspid valve replacement.  The patient was first seen in February 2020.  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  In July 2019 she had a history of epidural abscess drainage./At the time of her admission in January 2020 she was [redacted] weeks pregnant.  Cultures at that time grew strep species ultimately Enterococcus faecalis.  She ultimately delivered her baby.   Follow-up with cardiology in January including cardiac catheterization and echocardiogram showed clearing of the tricuspid vegetation but she continued to have severe tricuspid regurgitation.  Currently she denies any significant shortness of breath. she denies pedal edema status.  She complains of numbness and both feet but without significant edema presumably this left foot is more symptomatic than the right she notes that this is been the case since her surgery for the drainage of epidural abscess.   Currently she is having dental problems and is working on dental appointment   Current Activity/ Functional :  Patient is independent with mobility/ambulation, transfers, ADL's, IADL's.   Zubrod Score: At the time of surgery this patient's most appropriate activity status/level should be described as: []     0    Normal activity, no symptoms [x]     1    Restricted in  physical strenuous activity but ambulatory, able to do out light work []     2    Ambulatory and capable of self care, unable to do work activities, up and about               >50 % of waking hours                              []     3    Only limited self care, in bed greater than 50% of waking hours []     4    Completely disabled, no self care, confined to bed or chair []     5    Moribund   Past Medical History:  Diagnosis Date  . 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 Abscess of breast    rt breast  . ADD (attention deficit disorder)   . Back pain   . Bipolar 1 disorder ([place])   . Hypertension   . Leukocytosis   . Polysubstance abuse ([place])   . Pyelonephritis affecting pregnancy 05/24/2016  . Sepsis ([place]) 12/02/2017  . Suicidal overdose [facility])     Past Surgical History:  Procedure Laterality Date  . BACK SURGERY     scoliosis-in care everywhere 08/1999  . CESAREAN SECTION    . CHOLECYSTECTOMY  March 2015  .  FOREIGN BODY REMOVAL Right 05/28/2016   Procedure: REMOVAL FOREIGN BODY RIGHT ARM;  Surgeon: Meredith Pel, MD;  Location: [place];  Service: Orthopedics;  Laterality: Right;  . IRRIGATION AND DEBRIDEMENT ABSCESS Right 07/13/2014   Procedure: IRRIGATION AND DEBRIDEMEN TRIGHT BREAST ABSCESS;  Surgeon: Donnie Mesa, MD;  Location: [place];  Service: General;  Laterality: Right;  . LUMBAR LAMINECTOMY/DECOMPRESSION MICRODISCECTOMY N/A 12/05/2017   Procedure: LUMBAR LAMINECTOMY FOR EPIDURAL ABSCESS;  Surgeon: Newman Pies, MD;  Location: [place];  Service: Neurosurgery;  Laterality: N/A;  . MULTIPLE EXTRACTIONS WITH ALVEOLOPLASTY Bilateral 07/03/2018   Procedure: Extraction of tooth #'s 30 and 31 with alveoloplasty;  Surgeon: Lenn Cal, DDS;  Location: [place];  Service: Oral Surgery;  Laterality: Bilateral;  . RADIOLOGY WITH ANESTHESIA N/A 12/05/2017   Procedure: MRI WITH ANESTHESIA OF [facility] AND LUMBAR SPINE WITH AND WITHOUT;  Surgeon: Radiologist,  Medication, MD;  Location: [place];  Service: Radiology;  Laterality: N/A;  . RIGHT/LEFT HEART CATH AND CORONARY ANGIOGRAPHY N/A 05/21/2019   Procedure: RIGHT/LEFT HEART CATH AND CORONARY ANGIOGRAPHY;  Surgeon: Jolaine Artist, MD;  Location: [place] CV LAB;  Service: Cardiovascular;  Laterality: N/A;  . TEE WITHOUT CARDIOVERSION N/A 05/21/2019   Procedure: TRANSESOPHAGEAL ECHOCARDIOGRAM (TEE);  Surgeon: Jolaine Artist, MD;  Location: [facility] ENDOSCOPY;  Service: Cardiovascular;  Laterality: N/A;    Family History  Problem Relation Age of Onset  . Diabetes Mother   . Diabetes Father   . Hypertension Father   . Heart attack Neg Hx   . Hyperlipidemia Neg Hx   . Sudden death Neg Hx      Social History   Tobacco Use  Smoking Status Former Smoker  . Packs/day: 1.00  . Years: 13.00  . Pack years: 13.00  . Types: Cigarettes  . Quit date: 06/13/2018  . Years since quitting: 1.1  Smokeless Tobacco Never Used    Social History   Substance and Sexual Activity  Alcohol Use Not Currently   Comment: one or two drinks weekly     Allergies  Allergen Reactions  . Hydrocodone Itching  . Morphine And Related Nausea And Vomiting    Current Outpatient Medications  Medication Sig Dispense Refill  . methadone (METHADOSE) 40 MG disintegrating tablet Take 115 mg by mouth daily.     No current facility-administered medications for this visit.    Pertinent items are noted in HPI.   Review of Systems:     Cardiac Review of Systems: [Y] = yes  or   [ N ] = no   Chest Pain [  n  ]  Resting SOB [  n ] Exertional SOB  [n  ]  Orthopnea [ n ]   Pedal Edema [ n  ]    Palpitations [n  ] Syncope  [ n ]   Presyncope [  n ]   General Review of Systems: [Y] = yes [  ]=no Constitional: recent weight change [  ];  Wt loss over the last 3 months [   ] anorexia [  ]; fatigue [  ]; nausea [  ]; night sweats [  ]; fever [  ]; or chills [  ];           Eye : blurred vision [  ]; diplopia [   ]; vision  changes [  ];  Amaurosis fugax[  ]; Resp: cough [  ];  wheezing[  ];  hemoptysis[  ]; shortness of breath[  ];  paroxysmal nocturnal dyspnea[  ]; dyspnea on exertion[  ]; or orthopnea[  ];  GI:  gallstones[  ], vomiting[  ];  dysphagia[  ]; melena[  ];  hematochezia [  ]; heartburn[  ];   Hx of  Colonoscopy[  ]; GU: kidney stones [  ]; hematuria[  ];   dysuria [  ];  nocturia[  ];  history of     obstruction [  ]; urinary frequency [  ]             Skin: rash, swelling[  ];, hair loss[  ];  peripheral edema[  ];  or itching[  ]; Musculosketetal: myalgias[  ];  joint swelling[  ];  joint erythema[  ];  joint pain[  ];  back pain[  ];  Heme/Lymph: bruising[  ];  bleeding[  ];  anemia[  ];  Neuro: TIA[  ];  headaches[  ];  stroke[  ];  vertigo[  ];  seizures[  ];   paresthesias[  ];  difficulty walking[  ];  Psych:depression[  ]; anxiety[  ];  Endocrine: diabetes[  ];  thyroid dysfunction[  ];  Immunizations: Flu up to date [  ]; Pneumococcal up to date [  ];  Other:     PHYSICAL EXAMINATION: BP 132/90 (BP Location: Right Arm, Patient Position: Sitting, Cuff Size: Normal)   Pulse 79   Temp 98.2 F (36.8 C) (Oral)   Resp 20   Ht 5' 7.5" (1.715 m)   Wt 177 lb (80.3 kg)   SpO2 92% Comment: RA  BMI 27.31 kg/m  General appearance: alert, cooperative and no distress Head: Normocephalic, without obvious abnormality, atraumatic Neck: no adenopathy, no carotid bruit, no JVD, supple, symmetrical, trachea midline and thyroid not enlarged, symmetric, no tenderness/mass/nodules Lymph nodes: Cervical, supraclavicular, and axillary nodes normal. Resp: clear to auscultation bilaterally Cardio: regular rate and rhythm, S1, S2 normal, no murmur, click, rub or gallop GI: soft, non-tender; bowel sounds normal; no masses,  no organomegaly Extremities: extremities normal, atraumatic, no cyanosis or edema and Homans sign is negative, no sign of DVT Neurologic: Grossly normal  Diagnostic Studies &  Laboratory data:     Recent Radiology Findings:   No results found.      Recent Lab Findings: Lab Results  Component Value Date   WBC 5.3 06/26/2019   HGB 11.7 (L) 06/26/2019   HCT 36.4 06/26/2019   PLT 244 06/26/2019   GLUCOSE 109 (H) 06/26/2019   CHOL 144 06/26/2019   TRIG 127 06/26/2019   HDL 37 (L) 06/26/2019   LDLCALC 82 06/26/2019   ALT 10 06/26/2019   AST 21 06/26/2019   NA 139 06/26/2019   K 3.5 06/26/2019   CL 102 06/26/2019   CREATININE 0.57 06/26/2019   BUN 7 06/26/2019   CO2 30 06/26/2019   TSH 1.052 06/26/2019   INR 0.99 06/14/2018   HGBA1C 5.1 06/26/2019    Findings:  RA = 3 (v wave to 10) RV = 26/5 PA = 25/6 (11) PCW = 3 Fick cardiac output/index = 3.7/1.9 PVR = 2.2 WU FA sat = 99% PA sat = 64%, 66%  Assessment:  1. Normal coronary arteries  2. LVEF 50% 3. Well compensated filling pressures with low cardiac output by Fick (despite normal MV sat) 4. Severe TR  Plan/Discussion:  Surgical eval for TVR.   Glori Bickers, MD  12:53 PM  TRANSESOPHOGEAL ECHO REPORT       Patient Name:  Kelly [name]  Date of Exam: 05/21/2019  Medical Rec #: [id number]     Height:    67.5 in  Accession #:  [id number]     Weight:    172.0 lb  Date of Birth: 03/08/1986      BSA:     1.91 m  Patient Age:  33 years      BP:      128/52 mmHg  Patient Gender: F         HR:      55 bpm.  Exam Location: Inpatient     Procedure: Transesophageal Echo, Color Doppler, Cardiac Doppler and 3D  Echo   Indications:   Tricuspid Regurgitation    History:     Patient has prior history of Echocardiogram examinations,  most          recent 03/31/2019. 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 Sepsis.    Sonographer:   Darlina Sicilian RDCS  Referring Phys: [id number] DANIEL R BENSIMHON  Diagnosing Phys: Glori Bickers MD       PROCEDURE: The transesophogeal probe was passed through the esophogus of  the patient. The patient developed no complications during the procedure.   IMPRESSIONS    1. Left ventricular ejection fraction, by visual estimation, is 40 to  45%. The left ventricle has mildly decreased function. There is no left  ventricular hypertrophy.  2. The left ventricle demonstrates global hypokinesis.  3. Global right ventricle has mildly reduced systolic function.The right  ventricular size is moderately enlarged. No increase in right ventricular  wall thickness.  4. Left atrial size was normal.  5. No LAA clot.  6. Right atrial size was severely dilated.  7. The mitral valve is normal in structure. Trivial mitral valve  regurgitation.  8. The tricuspid valve is abnormal.  9. On 3-D images there seems to be near complete destruction of the  anterior leaflet of the tricuspid valve with wide open TR. No residual  vegetation.  10. The aortic valve is normal in structure. Aortic valve regurgitation is  not visualized.  11. The pulmonic valve was normal in structure. Pulmonic valve  regurgitation is not visualized.   FINDINGS  Left Ventricle: Left ventricular ejection fraction, by visual estimation,  is 40 to 45%. The left ventricle has mildly decreased function. The left  ventricle demonstrates global hypokinesis. There is no left ventricular  hypertrophy.   Right Ventricle: The right ventricular size is moderately enlarged. No  increase in right ventricular wall thickness. Global RV systolic function  is has mildly reduced systolic function.   Left Atrium: Left atrial size was normal in size. No LAA clot.   Right Atrium: Right atrial size was severely dilated   Pericardium: There is no evidence of pericardial effusion.   Mitral Valve: The mitral valve is normal in structure. Trivial mitral  valve regurgitation.   Tricuspid Valve: The tricuspid valve is abnormal. Tricuspid  valve  regurgitation is severe. On 3-D images there seems to be near complete  destruction of the anterior leaflet of the tricuspid valve with wide open  TR. No residual vegetation.   Aortic Valve: The aortic valve is normal in structure. Aortic valve  regurgitation is not visualized.   Pulmonic Valve: The pulmonic valve was normal in structure. Pulmonic valve  regurgitation is not visualized.   Aorta: The aortic root and ascending aorta are structurally normal, with  no evidence of dilitation.  Shunts: No atrial level shunt detected by color flow Doppler.     Glori Bickers MD  Electronically signed by Glori Bickers MD  Signature Date/Time: 05/21/2019/5:13:07 PM     Assessment / Plan:   Patient referred for consideration of tricuspid valve repair or replacement-likely replacement with degree of described obstruction of her anterior leaflet.  At this point she is asymptomatic as far as heart failure symptoms.  Social situation makes valve replacement problematic with the short life of a tissue valve and the complications of mechanical valve with long-term anticoagulation.  At this point the patient is agreeable to proceed with dealing with her dental issues  She notes that she is been drug-free since her hospitalization  Plan to see her back in 6 weeks  I  spent 65  minutes with  the patient face to face and  reviewing her previous history and imining  Grace Isaac MD      [address] [place] [address] Office [phone]     07/23/2019 1:18 PM

## 2019-09-03 ENCOUNTER — Encounter: Payer: Medicaid Other | Admitting: Cardiothoracic Surgery

## 2020-05-14 NOTE — L&D Delivery Note (Signed)
[facility]N [facility] Delivery Note  Kelly [name] is a 36 y.o. [id number] s/p SVD at [redacted]w[redacted]d She was admitted for IOL for cHTN, CHF secondary to severe tricupsid valve regurgitation.   ROM: 11h 475mith clear fluid GBS Status:  Negative/-- (05/12 1453) Maximum Maternal Temperature: 98.4  Labor Progress: . Initial SVE: 3cm. She was started on pitocin. [place] to complete.   Delivery Date/Time: 05:15 on 5/20  Delivery: Patient received ancef prior to delivery. Called to room and patient was complete and pushing. Head delivered ROA. Loose nuchal cord present and reduced. Shoulder and body delivered in usual fashion. Infant with spontaneous cry, placed on mother's abdomen, dried and stimulated. Cord clamped x 2 after 1-minute delay, and cut by patient. Cord blood drawn. Brisk gush of blood following baby delivery. TXA given and pitocin started. Placenta delivered spontaneously with gentle cord traction. Fundus firm with massage and Pitocin. Labia, perineum, vagina, and cervix inspected inspected with no lacerations. 1000 mcg rectal cytotec given. Patient to go to OR for BTL.   Baby Weight: pending  Placenta: Sent to pathology Complications: None Lacerations: none EBL: 250 mL Analgesia: Epidural   Infant:  APGAR (1 MIN):  8 APGAR (5 MINS): 9  APGAR (10 MINS):     JuSharene SkeansMD OBLincoln Regional Centeramily Medicine Fellow, FaMid [facility] LLCor WoMem[facility] - [facility]Co[facility]/20/2022, 5:37 AM

## 2020-07-13 ENCOUNTER — Emergency Department (HOSPITAL_COMMUNITY): Payer: Medicaid Other

## 2020-07-13 ENCOUNTER — Emergency Department (HOSPITAL_BASED_OUTPATIENT_CLINIC_OR_DEPARTMENT_OTHER): Payer: Medicaid Other

## 2020-07-13 ENCOUNTER — Encounter (HOSPITAL_COMMUNITY): Payer: Self-pay | Admitting: Emergency Medicine

## 2020-07-13 ENCOUNTER — Inpatient Hospital Stay (HOSPITAL_COMMUNITY): Payer: Medicaid Other

## 2020-07-13 ENCOUNTER — Inpatient Hospital Stay (HOSPITAL_COMMUNITY)
Admission: EM | Admit: 2020-07-13 | Discharge: 2020-08-25 | DRG: 831 | Disposition: A | Discharge status: Home / Self Care | Payer: Medicaid Other | Attending: Internal Medicine | Admitting: Internal Medicine

## 2020-07-13 DIAGNOSIS — O2312 Infections of bladder in pregnancy, second trimester: Secondary | ICD-10-CM | POA: Diagnosis present

## 2020-07-13 DIAGNOSIS — F1490 Cocaine use, unspecified, uncomplicated: Secondary | ICD-10-CM | POA: Diagnosis present

## 2020-07-13 DIAGNOSIS — O0933 Supervision of pregnancy with insufficient antenatal care, third trimester: Secondary | ICD-10-CM

## 2020-07-13 DIAGNOSIS — O09522 Supervision of elderly multigravida, second trimester: Secondary | ICD-10-CM | POA: Diagnosis not present

## 2020-07-13 DIAGNOSIS — I059 Rheumatic mitral valve disease, unspecified: Secondary | ICD-10-CM

## 2020-07-13 DIAGNOSIS — I519 Heart disease, unspecified: Secondary | ICD-10-CM | POA: Diagnosis not present

## 2020-07-13 DIAGNOSIS — Z23 Encounter for immunization: Secondary | ICD-10-CM

## 2020-07-13 DIAGNOSIS — O10013 Pre-existing essential hypertension complicating pregnancy, third trimester: Secondary | ICD-10-CM | POA: Diagnosis not present

## 2020-07-13 DIAGNOSIS — R1010 Upper abdominal pain, unspecified: Secondary | ICD-10-CM | POA: Diagnosis not present

## 2020-07-13 DIAGNOSIS — F1990 Other psychoactive substance use, unspecified, uncomplicated: Secondary | ICD-10-CM | POA: Diagnosis present

## 2020-07-13 DIAGNOSIS — I119 Hypertensive heart disease without heart failure: Secondary | ICD-10-CM | POA: Diagnosis not present

## 2020-07-13 DIAGNOSIS — N3000 Acute cystitis without hematuria: Secondary | ICD-10-CM | POA: Diagnosis present

## 2020-07-13 DIAGNOSIS — I330 Acute and subacute infective endocarditis: Secondary | ICD-10-CM

## 2020-07-13 DIAGNOSIS — R9431 Abnormal electrocardiogram [ECG] [EKG]: Secondary | ICD-10-CM | POA: Diagnosis present

## 2020-07-13 DIAGNOSIS — M79621 Pain in right upper arm: Secondary | ICD-10-CM | POA: Diagnosis not present

## 2020-07-13 DIAGNOSIS — M546 Pain in thoracic spine: Secondary | ICD-10-CM | POA: Diagnosis not present

## 2020-07-13 DIAGNOSIS — F1120 Opioid dependence, uncomplicated: Secondary | ICD-10-CM | POA: Diagnosis present

## 2020-07-13 DIAGNOSIS — I5023 Acute on chronic systolic (congestive) heart failure: Secondary | ICD-10-CM | POA: Diagnosis present

## 2020-07-13 DIAGNOSIS — A4901 Methicillin susceptible Staphylococcus aureus infection, unspecified site: Secondary | ICD-10-CM | POA: Diagnosis present

## 2020-07-13 DIAGNOSIS — R109 Unspecified abdominal pain: Secondary | ICD-10-CM

## 2020-07-13 DIAGNOSIS — R1011 Right upper quadrant pain: Secondary | ICD-10-CM

## 2020-07-13 DIAGNOSIS — R0600 Dyspnea, unspecified: Secondary | ICD-10-CM | POA: Diagnosis not present

## 2020-07-13 DIAGNOSIS — K0889 Other specified disorders of teeth and supporting structures: Secondary | ICD-10-CM | POA: Diagnosis present

## 2020-07-13 DIAGNOSIS — Z3A28 28 weeks gestation of pregnancy: Secondary | ICD-10-CM

## 2020-07-13 DIAGNOSIS — L03115 Cellulitis of right lower limb: Secondary | ICD-10-CM | POA: Diagnosis present

## 2020-07-13 DIAGNOSIS — Z3A27 27 weeks gestation of pregnancy: Secondary | ICD-10-CM | POA: Diagnosis not present

## 2020-07-13 DIAGNOSIS — J189 Pneumonia, unspecified organism: Secondary | ICD-10-CM | POA: Diagnosis not present

## 2020-07-13 DIAGNOSIS — R069 Unspecified abnormalities of breathing: Secondary | ICD-10-CM

## 2020-07-13 DIAGNOSIS — O98112 Syphilis complicating pregnancy, second trimester: Secondary | ICD-10-CM | POA: Diagnosis present

## 2020-07-13 DIAGNOSIS — A539 Syphilis, unspecified: Secondary | ICD-10-CM | POA: Diagnosis not present

## 2020-07-13 DIAGNOSIS — Z3493 Encounter for supervision of normal pregnancy, unspecified, third trimester: Secondary | ICD-10-CM | POA: Diagnosis not present

## 2020-07-13 DIAGNOSIS — K029 Dental caries, unspecified: Secondary | ICD-10-CM | POA: Diagnosis present

## 2020-07-13 DIAGNOSIS — O10012 Pre-existing essential hypertension complicating pregnancy, second trimester: Secondary | ICD-10-CM | POA: Diagnosis not present

## 2020-07-13 DIAGNOSIS — Z3A31 31 weeks gestation of pregnancy: Secondary | ICD-10-CM | POA: Diagnosis not present

## 2020-07-13 DIAGNOSIS — B9562 Methicillin resistant Staphylococcus aureus infection as the cause of diseases classified elsewhere: Secondary | ICD-10-CM | POA: Diagnosis not present

## 2020-07-13 DIAGNOSIS — M545 Low back pain, unspecified: Secondary | ICD-10-CM | POA: Diagnosis present

## 2020-07-13 DIAGNOSIS — I38 Endocarditis, valve unspecified: Secondary | ICD-10-CM | POA: Diagnosis present

## 2020-07-13 DIAGNOSIS — F1190 Opioid use, unspecified, uncomplicated: Secondary | ICD-10-CM | POA: Diagnosis present

## 2020-07-13 DIAGNOSIS — F1110 Opioid abuse, uncomplicated: Secondary | ICD-10-CM | POA: Diagnosis not present

## 2020-07-13 DIAGNOSIS — I50810 Right heart failure, unspecified: Secondary | ICD-10-CM | POA: Diagnosis not present

## 2020-07-13 DIAGNOSIS — O10913 Unspecified pre-existing hypertension complicating pregnancy, third trimester: Secondary | ICD-10-CM | POA: Diagnosis not present

## 2020-07-13 DIAGNOSIS — Z20822 Contact with and (suspected) exposure to covid-19: Secondary | ICD-10-CM | POA: Diagnosis present

## 2020-07-13 DIAGNOSIS — A419 Sepsis, unspecified organism: Secondary | ICD-10-CM | POA: Diagnosis present

## 2020-07-13 DIAGNOSIS — O99413 Diseases of the circulatory system complicating pregnancy, third trimester: Secondary | ICD-10-CM | POA: Diagnosis not present

## 2020-07-13 DIAGNOSIS — F1410 Cocaine abuse, uncomplicated: Secondary | ICD-10-CM | POA: Diagnosis present

## 2020-07-13 DIAGNOSIS — O99322 Drug use complicating pregnancy, second trimester: Secondary | ICD-10-CM | POA: Diagnosis present

## 2020-07-13 DIAGNOSIS — I50811 Acute right heart failure: Secondary | ICD-10-CM | POA: Diagnosis not present

## 2020-07-13 DIAGNOSIS — I071 Rheumatic tricuspid insufficiency: Secondary | ICD-10-CM | POA: Diagnosis present

## 2020-07-13 DIAGNOSIS — Z3A32 32 weeks gestation of pregnancy: Secondary | ICD-10-CM | POA: Diagnosis not present

## 2020-07-13 DIAGNOSIS — J1100 Influenza due to unidentified influenza virus with unspecified type of pneumonia: Secondary | ICD-10-CM | POA: Diagnosis present

## 2020-07-13 DIAGNOSIS — Z3A26 26 weeks gestation of pregnancy: Secondary | ICD-10-CM | POA: Diagnosis not present

## 2020-07-13 DIAGNOSIS — O99891 Other specified diseases and conditions complicating pregnancy: Secondary | ICD-10-CM

## 2020-07-13 DIAGNOSIS — I509 Heart failure, unspecified: Secondary | ICD-10-CM | POA: Diagnosis not present

## 2020-07-13 DIAGNOSIS — O99512 Diseases of the respiratory system complicating pregnancy, second trimester: Secondary | ICD-10-CM | POA: Diagnosis present

## 2020-07-13 DIAGNOSIS — O98812 Other maternal infectious and parasitic diseases complicating pregnancy, second trimester: Principal | ICD-10-CM | POA: Diagnosis present

## 2020-07-13 DIAGNOSIS — O98813 Other maternal infectious and parasitic diseases complicating pregnancy, third trimester: Secondary | ICD-10-CM | POA: Diagnosis not present

## 2020-07-13 DIAGNOSIS — B9561 Methicillin susceptible Staphylococcus aureus infection as the cause of diseases classified elsewhere: Secondary | ICD-10-CM | POA: Diagnosis not present

## 2020-07-13 DIAGNOSIS — I50813 Acute on chronic right heart failure: Secondary | ICD-10-CM | POA: Diagnosis not present

## 2020-07-13 DIAGNOSIS — O10919 Unspecified pre-existing hypertension complicating pregnancy, unspecified trimester: Secondary | ICD-10-CM | POA: Diagnosis not present

## 2020-07-13 DIAGNOSIS — D638 Anemia in other chronic diseases classified elsewhere: Secondary | ICD-10-CM | POA: Diagnosis present

## 2020-07-13 DIAGNOSIS — R0601 Orthopnea: Secondary | ICD-10-CM | POA: Diagnosis not present

## 2020-07-13 DIAGNOSIS — R000 Tachycardia, unspecified: Secondary | ICD-10-CM | POA: Diagnosis present

## 2020-07-13 DIAGNOSIS — Z3A25 25 weeks gestation of pregnancy: Secondary | ICD-10-CM | POA: Diagnosis not present

## 2020-07-13 DIAGNOSIS — Z98891 History of uterine scar from previous surgery: Secondary | ICD-10-CM

## 2020-07-13 DIAGNOSIS — M549 Dorsalgia, unspecified: Secondary | ICD-10-CM | POA: Diagnosis not present

## 2020-07-13 DIAGNOSIS — F1910 Other psychoactive substance abuse, uncomplicated: Secondary | ICD-10-CM | POA: Diagnosis present

## 2020-07-13 DIAGNOSIS — O88312 Pyemic and septic embolism in pregnancy, second trimester: Secondary | ICD-10-CM | POA: Diagnosis not present

## 2020-07-13 DIAGNOSIS — I76 Septic arterial embolism: Secondary | ICD-10-CM | POA: Diagnosis present

## 2020-07-13 DIAGNOSIS — O88313 Pyemic and septic embolism in pregnancy, third trimester: Secondary | ICD-10-CM | POA: Diagnosis not present

## 2020-07-13 DIAGNOSIS — O98113 Syphilis complicating pregnancy, third trimester: Secondary | ICD-10-CM

## 2020-07-13 DIAGNOSIS — O34219 Maternal care for unspecified type scar from previous cesarean delivery: Secondary | ICD-10-CM

## 2020-07-13 DIAGNOSIS — I361 Nonrheumatic tricuspid (valve) insufficiency: Secondary | ICD-10-CM

## 2020-07-13 DIAGNOSIS — O99412 Diseases of the circulatory system complicating pregnancy, second trimester: Secondary | ICD-10-CM | POA: Diagnosis present

## 2020-07-13 DIAGNOSIS — O09523 Supervision of elderly multigravida, third trimester: Secondary | ICD-10-CM | POA: Diagnosis not present

## 2020-07-13 DIAGNOSIS — R52 Pain, unspecified: Secondary | ICD-10-CM

## 2020-07-13 DIAGNOSIS — I079 Rheumatic tricuspid valve disease, unspecified: Secondary | ICD-10-CM | POA: Diagnosis present

## 2020-07-13 DIAGNOSIS — I078 Other rheumatic tricuspid valve diseases: Secondary | ICD-10-CM | POA: Diagnosis present

## 2020-07-13 DIAGNOSIS — O0930 Supervision of pregnancy with insufficient antenatal care, unspecified trimester: Secondary | ICD-10-CM

## 2020-07-13 DIAGNOSIS — O0932 Supervision of pregnancy with insufficient antenatal care, second trimester: Secondary | ICD-10-CM

## 2020-07-13 DIAGNOSIS — R6520 Severe sepsis without septic shock: Secondary | ICD-10-CM | POA: Diagnosis present

## 2020-07-13 DIAGNOSIS — O99012 Anemia complicating pregnancy, second trimester: Secondary | ICD-10-CM | POA: Diagnosis present

## 2020-07-13 DIAGNOSIS — O99323 Drug use complicating pregnancy, third trimester: Secondary | ICD-10-CM

## 2020-07-13 DIAGNOSIS — I5043 Acute on chronic combined systolic (congestive) and diastolic (congestive) heart failure: Secondary | ICD-10-CM | POA: Diagnosis not present

## 2020-07-13 DIAGNOSIS — Z8679 Personal history of other diseases of the circulatory system: Secondary | ICD-10-CM

## 2020-07-13 DIAGNOSIS — Z3A29 29 weeks gestation of pregnancy: Secondary | ICD-10-CM | POA: Diagnosis not present

## 2020-07-13 DIAGNOSIS — O99712 Diseases of the skin and subcutaneous tissue complicating pregnancy, second trimester: Secondary | ICD-10-CM | POA: Diagnosis present

## 2020-07-13 DIAGNOSIS — I2690 Septic pulmonary embolism without acute cor pulmonale: Secondary | ICD-10-CM | POA: Diagnosis not present

## 2020-07-13 DIAGNOSIS — Z8619 Personal history of other infectious and parasitic diseases: Secondary | ICD-10-CM | POA: Diagnosis present

## 2020-07-13 DIAGNOSIS — F17210 Nicotine dependence, cigarettes, uncomplicated: Secondary | ICD-10-CM | POA: Diagnosis present

## 2020-07-13 DIAGNOSIS — O99332 Smoking (tobacco) complicating pregnancy, second trimester: Secondary | ICD-10-CM | POA: Diagnosis present

## 2020-07-13 DIAGNOSIS — O99419 Diseases of the circulatory system complicating pregnancy, unspecified trimester: Secondary | ICD-10-CM

## 2020-07-13 DIAGNOSIS — Z362 Encounter for other antenatal screening follow-up: Secondary | ICD-10-CM | POA: Diagnosis not present

## 2020-07-13 DIAGNOSIS — I272 Pulmonary hypertension, unspecified: Secondary | ICD-10-CM | POA: Diagnosis present

## 2020-07-13 DIAGNOSIS — Z3009 Encounter for other general counseling and advice on contraception: Secondary | ICD-10-CM | POA: Diagnosis present

## 2020-07-13 DIAGNOSIS — R21 Rash and other nonspecific skin eruption: Secondary | ICD-10-CM | POA: Diagnosis not present

## 2020-07-13 DIAGNOSIS — A4102 Sepsis due to Methicillin resistant Staphylococcus aureus: Secondary | ICD-10-CM | POA: Diagnosis not present

## 2020-07-13 DIAGNOSIS — R7881 Bacteremia: Secondary | ICD-10-CM | POA: Diagnosis present

## 2020-07-13 DIAGNOSIS — I339 Acute and subacute endocarditis, unspecified: Secondary | ICD-10-CM | POA: Diagnosis not present

## 2020-07-13 DIAGNOSIS — O0990 Supervision of high risk pregnancy, unspecified, unspecified trimester: Secondary | ICD-10-CM

## 2020-07-13 DIAGNOSIS — O26893 Other specified pregnancy related conditions, third trimester: Secondary | ICD-10-CM | POA: Diagnosis not present

## 2020-07-13 DIAGNOSIS — Z79899 Other long term (current) drug therapy: Secondary | ICD-10-CM

## 2020-07-13 DIAGNOSIS — O133 Gestational [pregnancy-induced] hypertension without significant proteinuria, third trimester: Secondary | ICD-10-CM

## 2020-07-13 DIAGNOSIS — O09893 Supervision of other high risk pregnancies, third trimester: Secondary | ICD-10-CM | POA: Diagnosis not present

## 2020-07-13 HISTORY — DX: Foreign body granuloma of soft tissue, not elsewhere classified, right upper arm: M60.221

## 2020-07-13 LAB — URINALYSIS, ROUTINE W REFLEX MICROSCOPIC
Bilirubin Urine: NEGATIVE
Glucose, UA: NEGATIVE mg/dL
Ketones, ur: 5 mg/dL — AB
Nitrite: POSITIVE — AB
Protein, ur: 100 mg/dL — AB
Specific Gravity, Urine: 1.028 (ref 1.005–1.030)
WBC, UA: >50 WBC/hpf — ABNORMAL HIGH (ref 0–5)
pH: 5 (ref 5.0–8.0)

## 2020-07-13 LAB — POCT I-STAT 7, (LYTES, BLD GAS, ICA,H+H)
Acid-base deficit: 1 mmol/L (ref 0.0–2.0)
Bicarbonate: 23.5 mmol/L (ref 20.0–28.0)
Calcium, Ion: 1.13 mmol/L — ABNORMAL LOW (ref 1.15–1.40)
HCT: 20 % — ABNORMAL LOW (ref 36.0–46.0)
Hemoglobin: 6.8 g/dL — CL (ref 12.0–15.0)
O2 Saturation: 95 %
Patient temperature: 98.3
Potassium: 3 mmol/L — ABNORMAL LOW (ref 3.5–5.1)
Sodium: 130 mmol/L — ABNORMAL LOW (ref 135–145)
TCO2: 25 mmol/L (ref 22–32)
pCO2 arterial: 35.1 mmHg (ref 32.0–48.0)
pH, Arterial: 7.432 (ref 7.350–7.450)
pO2, Arterial: 74 mmHg — ABNORMAL LOW (ref 83.0–108.0)

## 2020-07-13 LAB — HEMOGLOBIN A1C
Hgb A1c MFr Bld: 4.6 % — ABNORMAL LOW (ref 4.8–5.6)
Mean Plasma Glucose: 85.32 mg/dL

## 2020-07-13 LAB — RAPID URINE DRUG SCREEN, HOSP PERFORMED
Amphetamines: POSITIVE — AB
Barbiturates: NOT DETECTED
Benzodiazepines: NOT DETECTED
Cocaine: POSITIVE — AB
Opiates: NOT DETECTED
Tetrahydrocannabinol: NOT DETECTED

## 2020-07-13 LAB — CK: Total CK: 68 U/L (ref 38–234)

## 2020-07-13 LAB — COMPREHENSIVE METABOLIC PANEL
ALT: 7 U/L (ref 0–44)
AST: 18 U/L (ref 15–41)
Albumin: 2.3 g/dL — ABNORMAL LOW (ref 3.5–5.0)
Alkaline Phosphatase: 138 U/L — ABNORMAL HIGH (ref 38–126)
Anion gap: 14 (ref 5–15)
BUN: 11 mg/dL (ref 6–20)
CO2: 21 mmol/L — ABNORMAL LOW (ref 22–32)
Calcium: 8.6 mg/dL — ABNORMAL LOW (ref 8.9–10.3)
Chloride: 94 mmol/L — ABNORMAL LOW (ref 98–111)
Creatinine, Ser: 0.74 mg/dL (ref 0.44–1.00)
GFR, Estimated: >60 mL/min (ref >60)
Glucose, Bld: 138 mg/dL — ABNORMAL HIGH (ref 70–99)
Potassium: 3.2 mmol/L — ABNORMAL LOW (ref 3.5–5.1)
Sodium: 129 mmol/L — ABNORMAL LOW (ref 135–145)
Total Bilirubin: 1.1 mg/dL (ref 0.3–1.2)
Total Protein: 7.1 g/dL (ref 6.5–8.1)

## 2020-07-13 LAB — CBC
HCT: 28.5 % — ABNORMAL LOW (ref 36.0–46.0)
Hemoglobin: 9.4 g/dL — ABNORMAL LOW (ref 12.0–15.0)
MCH: 30.9 pg (ref 26.0–34.0)
MCHC: 33 g/dL (ref 30.0–36.0)
MCV: 93.8 fL (ref 80.0–100.0)
Platelets: 322 10*3/uL (ref 150–400)
RBC: 3.04 MIL/uL — ABNORMAL LOW (ref 3.87–5.11)
RDW: 14.3 % (ref 11.5–15.5)
WBC: 19.1 10*3/uL — ABNORMAL HIGH (ref 4.0–10.5)
nRBC: 0 % (ref 0.0–0.2)

## 2020-07-13 LAB — HIV ANTIBODY (ROUTINE TESTING W REFLEX): HIV Screen 4th Generation wRfx: NONREACTIVE

## 2020-07-13 LAB — I-STAT BETA HCG BLOOD, ED (MC, WL, AP ONLY): I-stat hCG, quantitative: >2000 m[IU]/mL — ABNORMAL HIGH (ref <5)

## 2020-07-13 LAB — RESP PANEL BY RT-PCR (FLU A&B, COVID) ARPGX2
Influenza A by PCR: NEGATIVE
Influenza B by PCR: NEGATIVE
SARS Coronavirus 2 by RT PCR: NEGATIVE

## 2020-07-13 LAB — TROPONIN I (HIGH SENSITIVITY)
Troponin I (High Sensitivity): 14 ng/L (ref <18)
Troponin I (High Sensitivity): 19 ng/L — ABNORMAL HIGH (ref <18)

## 2020-07-13 LAB — HEPATIC FUNCTION PANEL
ALT: 6 U/L (ref 0–44)
AST: 14 U/L — ABNORMAL LOW (ref 15–41)
Albumin: 2 g/dL — ABNORMAL LOW (ref 3.5–5.0)
Alkaline Phosphatase: 128 U/L — ABNORMAL HIGH (ref 38–126)
Bilirubin, Direct: 0.5 mg/dL — ABNORMAL HIGH (ref 0.0–0.2)
Indirect Bilirubin: 0.8 mg/dL (ref 0.3–0.9)
Total Bilirubin: 1.3 mg/dL — ABNORMAL HIGH (ref 0.3–1.2)
Total Protein: 6.3 g/dL — ABNORMAL LOW (ref 6.5–8.1)

## 2020-07-13 LAB — BRAIN NATRIURETIC PEPTIDE: B Natriuretic Peptide: 157.1 pg/mL — ABNORMAL HIGH (ref 0.0–100.0)

## 2020-07-13 LAB — GLUCOSE, CAPILLARY: Glucose-Capillary: 108 mg/dL — ABNORMAL HIGH (ref 70–99)

## 2020-07-13 LAB — ECHOCARDIOGRAM LIMITED

## 2020-07-13 LAB — LACTIC ACID, PLASMA
Lactic Acid, Venous: 3.2 mmol/L (ref 0.5–1.9)
Lactic Acid, Venous: 2 mmol/L (ref 0.5–1.9)
Lactic Acid, Venous: 1.4 mmol/L (ref 0.5–1.9)

## 2020-07-13 LAB — HCG, QUANTITATIVE, PREGNANCY: hCG, Beta Chain, Quant, S: 12898 m[IU]/mL — ABNORMAL HIGH (ref <5)

## 2020-07-13 LAB — LIPASE, BLOOD: Lipase: 19 U/L (ref 11–51)

## 2020-07-13 LAB — MRSA PCR SCREENING: MRSA by PCR: POSITIVE — AB

## 2020-07-13 MED ORDER — ACETAMINOPHEN 325 MG PO TABS
650.0000 mg | ORAL_TABLET | Freq: Four times a day (QID) | ORAL | Status: DC
  Administered 2020-07-13 – 2020-08-25 (×164): 650 mg via ORAL
  Filled 2020-07-13 (×169): qty 2

## 2020-07-13 MED ORDER — INSULIN ASPART 100 UNIT/ML ~~LOC~~ SOLN: 0.0000 [IU] | Freq: Three times a day (TID) | SUBCUTANEOUS | Status: DC

## 2020-07-13 MED ORDER — SODIUM CHLORIDE 0.9 % IV SOLN
2.0000 g | Freq: Three times a day (TID) | INTRAVENOUS | Status: DC
  Administered 2020-07-13 – 2020-07-14 (×2): 2 g via INTRAVENOUS
  Filled 2020-07-13 (×3): qty 2

## 2020-07-13 MED ORDER — VANCOMYCIN HCL 10 G IV SOLR
1500.0000 mg | Freq: Once | INTRAVENOUS | Status: AC
  Administered 2020-07-13: 1500 mg via INTRAVENOUS
  Filled 2020-07-13: qty 1000

## 2020-07-13 MED ORDER — FUROSEMIDE 10 MG/ML IJ SOLN
40.0000 mg | Freq: Once | INTRAMUSCULAR | Status: AC
  Administered 2020-07-13: 40 mg via INTRAVENOUS
  Filled 2020-07-13: qty 4

## 2020-07-13 MED ORDER — SODIUM CHLORIDE 0.9 % IV BOLUS
500.0000 mL | Freq: Once | INTRAVENOUS | Status: AC
  Administered 2020-07-13: 500 mL via INTRAVENOUS

## 2020-07-13 MED ORDER — HYDROMORPHONE HCL 1 MG/ML IJ SOLN
1.0000 mg | INTRAMUSCULAR | Status: DC | PRN
  Administered 2020-07-13 – 2020-07-25 (×61): 1 mg via INTRAVENOUS
  Filled 2020-07-13 (×63): qty 1

## 2020-07-13 MED ORDER — FENTANYL CITRATE (PF) 100 MCG/2ML IJ SOLN: 50.0000 ug | Freq: Once | INTRAMUSCULAR | Status: AC

## 2020-07-13 MED ORDER — SODIUM CHLORIDE 0.9 % IV SOLN
1.0000 g | Freq: Once | INTRAVENOUS | Status: AC
  Administered 2020-07-13: 1 g via INTRAVENOUS
  Filled 2020-07-13: qty 10

## 2020-07-13 MED ORDER — MUPIROCIN 2 % EX OINT
1.0000 "application " | TOPICAL_OINTMENT | Freq: Two times a day (BID) | CUTANEOUS | Status: AC
  Administered 2020-07-14 – 2020-07-18 (×10): 1 via NASAL
  Filled 2020-07-13 (×2): qty 22

## 2020-07-13 MED ORDER — SODIUM CHLORIDE 0.9% FLUSH
10.0000 mL | INTRAVENOUS | Status: DC | PRN
  Administered 2020-07-17: 10 mL

## 2020-07-13 MED ORDER — PROMETHAZINE HCL 25 MG/ML IJ SOLN
12.5000 mg | Freq: Four times a day (QID) | INTRAMUSCULAR | Status: DC | PRN
  Filled 2020-07-13: qty 1

## 2020-07-13 MED ORDER — ONDANSETRON HCL 4 MG/2ML IJ SOLN
4.0000 mg | Freq: Once | INTRAMUSCULAR | Status: AC
  Administered 2020-07-13: 4 mg via INTRAVENOUS
  Filled 2020-07-13: qty 2

## 2020-07-13 MED ORDER — CHLORHEXIDINE GLUCONATE CLOTH 2 % EX PADS
6.0000 | MEDICATED_PAD | Freq: Every day | CUTANEOUS | Status: AC
  Administered 2020-07-14 – 2020-07-18 (×5): 6 via TOPICAL

## 2020-07-13 MED ORDER — SODIUM CHLORIDE 0.9% IV SOLUTION: Freq: Once | INTRAVENOUS | Status: DC

## 2020-07-13 MED ORDER — ENOXAPARIN SODIUM 40 MG/0.4ML ~~LOC~~ SOLN
40.0000 mg | SUBCUTANEOUS | Status: DC
  Administered 2020-07-13 – 2020-07-18 (×6): 40 mg via SUBCUTANEOUS
  Filled 2020-07-13 (×6): qty 0.4

## 2020-07-13 MED ORDER — POTASSIUM CHLORIDE CRYS ER 20 MEQ PO TBCR
40.0000 meq | EXTENDED_RELEASE_TABLET | Freq: Once | ORAL | Status: AC
  Administered 2020-07-13: 40 meq via ORAL
  Filled 2020-07-13: qty 2

## 2020-07-13 MED ORDER — VANCOMYCIN HCL IN DEXTROSE 1-5 GM/200ML-% IV SOLN
1000.0000 mg | Freq: Three times a day (TID) | INTRAVENOUS | Status: AC
Start: 2020-07-14 — End: 2020-07-15
  Administered 2020-07-14 – 2020-07-15 (×6): 1000 mg via INTRAVENOUS
  Filled 2020-07-13 (×9): qty 200

## 2020-07-13 MED ORDER — PROCHLORPERAZINE EDISYLATE 10 MG/2ML IJ SOLN
10.0000 mg | Freq: Four times a day (QID) | INTRAMUSCULAR | Status: DC | PRN
  Administered 2020-07-13 – 2020-08-12 (×98): 10 mg via INTRAVENOUS
  Filled 2020-07-13 (×99): qty 2

## 2020-07-13 MED ORDER — DOCUSATE SODIUM 100 MG PO CAPS: 100.0000 mg | ORAL_CAPSULE | Freq: Two times a day (BID) | ORAL | Status: DC | PRN

## 2020-07-13 MED ORDER — LACTATED RINGERS IV SOLN: INTRAVENOUS | Status: DC

## 2020-07-13 MED ORDER — POLYETHYLENE GLYCOL 3350 17 G PO PACK
17.0000 g | PACK | Freq: Every day | ORAL | Status: DC | PRN
Start: 2020-07-13 — End: 2020-07-19
  Administered 2020-07-18: 17 g via ORAL
  Filled 2020-07-13 (×2): qty 1

## 2020-07-13 MED ORDER — SODIUM CHLORIDE 0.9 % IV SOLN
500.0000 mg | Freq: Once | INTRAVENOUS | Status: AC
  Administered 2020-07-13: 500 mg via INTRAVENOUS
  Filled 2020-07-13: qty 500

## 2020-07-13 MED ORDER — FENTANYL CITRATE (PF) 100 MCG/2ML IJ SOLN
INTRAMUSCULAR | Status: AC
  Administered 2020-07-13: 50 ug via INTRAVENOUS
  Filled 2020-07-13: qty 2

## 2020-07-13 MED ORDER — FENTANYL CITRATE (PF) 100 MCG/2ML IJ SOLN
50.0000 ug | Freq: Once | INTRAMUSCULAR | Status: AC
  Administered 2020-07-13: 50 ug via INTRAVENOUS
  Filled 2020-07-13: qty 2

## 2020-07-13 NOTE — Consult Note (Addendum)
Cardiology Consultation:   Patient ID: Kelly [name] MRN: [id number]; DOB: Nov 23, 1985  Admit date: 07/13/2020 Date of Consult: 07/13/2020  PCP:  [place], [place]  Cardiologist:  Glori Bickers, MD Electrophysiologist:  None   Patient Profile:   Kelly [name] is a 35 y.o. G17P4 female who is [redacted] weeks pregnant and has a history of chronic systolic CHF with EF as low as 30-35% on Echo in 08/2018 but improved to 55-60% in 03/2019, recurrent endocarditis of tricuspid valve in 11/2017 and again in 06/2018 secondary to IV drug use, hypertension, bipolar disorder, and polysubstance abuse who is being seen today for the evaluation of CHF and suspected endocarditis at the request of Dr. Lorin Mercy.  History of Present Illness:   Ms. Hoelzel is a 35 year old female with the above history who is followed by Dr. Haroldine Laws.   Patient was admitted in 11/2017 with tricuspid valve endocarditis and a lumbar asscess. Echo at that time showed severe TR and a tricuspid vegetation. She was treated with IV antibiotics. She had a prolonged hospitalization and underwent laminectomy and drainage of lumbar abscess. She was readmitted in 06/2020 with sepsis, pneumonia, and recurrent endocarditis. Echo showed normal LV function with severe TR and a large tricuspid vegetation. On repeat Echo in 08/2018, LVEF 30-35% with resolution of tricuspid vegetation but she still had severe TR. She was [redacted] weeks pregnant at the time. For a while, she was able to remained off drugs. She underwent TEE and R/LHC in 05/2019 in preparation for possible surgical repair. TEE showed LVEF of 40-45% with global hypokinesis and wide open TR with no residual vegetation. RV was moderately enlarged with mildly reduced systolic function. R/LHC showed normal coronaries and severe TR with well compensated filling pressure and low cardiac output by Fick. She was seen by Dr. Servando Snare in 07/2019 at which point she  was having some dental problems. Plan was to address dental issues and then follow-up with her in 6 weeks to continue to discuss possible valve surgery. Unfortunately, patient was lost to follow-up. She has not been seen by Cardiology or CT surgery since that time.  Patient presented to the ED today for further evaluation of right sided abdominal pain and bilateral lower extremities. In the ED, patient hypoxic with O2 sats of 88% on room air (improved after being placed on nasal cannula). She was also tachycardic and tachypneic but BP stable. EKG showed sinus tachycardia, rate 114 bpm, with RBBB, non-specific ST/T changes, and prolonged QTc. High-sensitivity troponin 14 >> 19. BNP mildly elevated at 157. Chest x-ray showed mild cardiomegaly with low lung volumes and bibasilar airspace opacities concerning for pneumonia. WBC 19.1, Hgb 9.4, Plts 322. Na 129, K 3.2, CO2 21, Glucose 138, BUN 11, Cr 0.74. Albumin 2.3, AST 18, ALT 7, Alk Phos 138, Total Bili 1.1. Lipase normal. Lactic acid elevated at 3.2. I-stat HcG positive at >2,000 and quantitative beta hCG also elevated. Urinaylsis concerning for UTI. Urine and blood cultures pending. Urine drug screen pending.   At the time of this evaluation, patient still tachycardic and tachypneic.  She is ill-appearing and is actively withdrawing.  He states she can barely talk because she is feeling short of breath and is in pain. It is difficult to get a complete history because of this.  However, it sounds like she has had worsening lower extremity edema and shortness of breath for the past 2 to 3 weeks.  Shortness of breath  has progressed and she is now short of breath even at rest.  She is been unable to sleep due to her shortness of breath.  She also reports bilateral flank pain as well as some left sided chest pain.  However, the flank pain is worse.  She notes her heart racing and states she had a syncopal episode prior to admission.  I was unable to get many details  on this event given patient's pain level and shortness of breath.  Patient was unsure of how long she had been pregnant.  She has had no prenatal care. She reports continued drug use - mainly Fentanyl (at least 3 grams per day) but some Heroin and cocaine as well.   Past Medical History:  Diagnosis Date  . 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 Abscess of breast    rt breast  . ADD (attention deficit disorder)   . Back pain   . Bipolar 1 disorder ([place])   . Endocarditis 2020  . Hypertension   . Leukocytosis   . Polysubstance abuse ([place])   . Pyelonephritis affecting pregnancy 05/24/2016  . Sepsis ([place]) 12/02/2017  . Suicidal overdose [facility])     Past Surgical History:  Procedure Laterality Date  . BACK SURGERY     scoliosis-in care everywhere 08/1999  . CESAREAN SECTION    . CHOLECYSTECTOMY  March 2015  . FOREIGN BODY REMOVAL Right 05/28/2016   Procedure: REMOVAL FOREIGN BODY RIGHT ARM;  Surgeon: Meredith Pel, MD;  Location: [place];  Service: Orthopedics;  Laterality: Right;  . IRRIGATION AND DEBRIDEMENT ABSCESS Right 07/13/2014   Procedure: IRRIGATION AND DEBRIDEMEN TRIGHT BREAST ABSCESS;  Surgeon: Donnie Mesa, MD;  Location: [place];  Service: General;  Laterality: Right;  . LUMBAR LAMINECTOMY/DECOMPRESSION MICRODISCECTOMY N/A 12/05/2017   Procedure: LUMBAR LAMINECTOMY FOR EPIDURAL ABSCESS;  Surgeon: Newman Pies, MD;  Location: [place];  Service: Neurosurgery;  Laterality: N/A;  . MULTIPLE EXTRACTIONS WITH ALVEOLOPLASTY Bilateral 07/03/2018   Procedure: Extraction of tooth #'s 30 and 31 with alveoloplasty;  Surgeon: Lenn Cal, DDS;  Location: [place];  Service: Oral Surgery;  Laterality: Bilateral;  . RADIOLOGY WITH ANESTHESIA N/A 12/05/2017   Procedure: MRI WITH ANESTHESIA OF [facility] AND LUMBAR SPINE WITH AND WITHOUT;  Surgeon: Radiologist, Medication, MD;  Location: [place];  Service: Radiology;  Laterality: N/A;  . RIGHT/LEFT HEART CATH AND CORONARY  ANGIOGRAPHY N/A 05/21/2019   Procedure: RIGHT/LEFT HEART CATH AND CORONARY ANGIOGRAPHY;  Surgeon: Jolaine Artist, MD;  Location: [place] CV LAB;  Service: Cardiovascular;  Laterality: N/A;  . TEE WITHOUT CARDIOVERSION N/A 05/21/2019   Procedure: TRANSESOPHAGEAL ECHOCARDIOGRAM (TEE);  Surgeon: Jolaine Artist, MD;  Location: [facility] ENDOSCOPY;  Service: Cardiovascular;  Laterality: N/A;     Home Medications:  Prior to Admission medications   Not on File    Inpatient Medications: Scheduled Meds:  Continuous Infusions: . azithromycin 500 mg (07/13/20 1544)   PRN Meds: sodium chloride flush  Allergies:    Allergies  Allergen Reactions  . Hydrocodone Itching  . Morphine And Related Nausea And Vomiting    Social History:   Social History   Socioeconomic History  . Marital status: Single    Spouse name: Not on file  . Number of children: Not on file  . Years of education: Not on file  . Highest education level: Not on file  Occupational History  . Occupation: unemployed  Tobacco Use  . Smoking status: Former Smoker  Packs/day: 1.00    Years: 13.00    Pack years: 13.00    Types: Cigarettes    Quit date: 06/13/2018    Years since quitting: 2.0  . Smokeless tobacco: Never Used  Vaping Use  . Vaping Use: Never used  Substance and Sexual Activity  . Alcohol use: Not Currently    Comment: one or two drinks weekly  . Drug use: Yes    Types: Cocaine, Heroin, IV, Fentanyl    Comment: heroin, crack cocaine, fentanyl last used today (07/13/20)   . Sexual activity: Yes    Birth control/protection: Condom  Other Topics Concern  . Not on file  Social History Narrative  . Not on file   Social Determinants of Health   Financial Resource Strain: Not on file  Food Insecurity: Not on file  Transportation Needs: Not on file  Physical Activity: Not on file  Stress: Not on file  Social Connections: Not on file  Intimate Partner Violence: Not on file    Family History:    Family History  Problem Relation Age of Onset  . Diabetes Mother   . Diabetes Father   . Hypertension Father   . Heart attack Neg Hx   . Hyperlipidemia Neg Hx   . Sudden death Neg Hx      ROS:  Please see the history of present illness.  Difficult to obtain full ROS due to shortness of breath, pain, and likely withdrawal.  Physical Exam/Data:   Vitals:   07/13/20 1345 07/13/20 1415 07/13/20 1512 07/13/20 1552  BP: 120/70 125/70  128/72  Pulse: (!) 111 (!) 109  (!) 117  Resp: 17 (!) 24  (!) 35  Temp:   99.3 F (37.4 C)   TempSrc:   Oral   SpO2: 98% 99%  100%   No intake or output data in the 24 hours ending 07/13/20 1633 Last 3 Weights 07/23/2019 05/21/2019 03/20/2019  Weight (lbs) 177 lb 172 lb 187 lb 12.8 oz  Weight (kg) 80.287 kg 78.019 kg 85.186 kg  Some encounter information is confidential and restricted. Go to Review Flowsheets activity to see all data.     There is no height or weight on file to calculate BMI.  General: 35 y.o. female resting comfortably in no acute distress. HEENT: Normocephalic and atraumatic. Sclera clear. EOMs intact. Mouth: Poor dentition. Neck: Supple. No JVD appreciated with patient sitting completely upright. Heart: Tachycardic with regular rhythm. III/VI systolic murmur.  Radial and distal pedal pulses 2+ and equal bilaterally. Lungs: Tachypneic with mild increased work of breathing. Mild crackles in bases.  Abdomen: Soft, gravid uterus, and non-tender to palpation.  MSK: Normal strength and tone for age. Extremities: 3+ pitting edema of bilateral lower extremities. Skin: Warm and dry. Neuro: Alert and oriented x3. No focal deficits. Psych: Normal affect. Responds appropriately.   EKG:  The EKG was personally reviewed and demonstrates:  Sinus tachycardia, rate 114 bpm, with RBBB, non-specific ST/T changes, and prolonged QTc (636 ms). Telemetry:  Telemetry was personally reviewed and demonstrates:  Sinus tachycardia with heart rates in the  100's to 120's.  Relevant CV Studies:  TEE 05/21/2019: Impressions: 1. Left ventricular ejection fraction, by visual estimation, is 40 to  45%. The left ventricle has mildly decreased function. There is no left  ventricular hypertrophy.  2. The left ventricle demonstrates global hypokinesis.  3. Global right ventricle has mildly reduced systolic function.The right  ventricular size is moderately enlarged. No increase in right ventricular  wall  thickness.  4. Left atrial size was normal.  5. No LAA clot.  6. Right atrial size was severely dilated.  7. The mitral valve is normal in structure. Trivial mitral valve  regurgitation.  8. The tricuspid valve is abnormal.  9. On 3-D images there seems to be near complete destruction of the  anterior leaflet of the tricuspid valve with wide open TR. No residual  vegetation.  10. The aortic valve is normal in structure. Aortic valve regurgitation is  not visualized.  11. The pulmonic valve was normal in structure. Pulmonic valve  regurgitation is not visualized. _______________  Right/Left Cardiac Catheterization 05/21/2019: Findings: RA = 3 (v wave to 10) RV = 26/5 PA = 25/6 (11) PCW = 3 Fick cardiac output/index = 3.7/1.9 PVR = 2.2 WU FA sat = 99% PA sat = 64%, 66%  Assessment: 1. Normal coronary arteries  2. LVEF 50% 3. Well compensated filling pressures with low cardiac output by Fick (despite normal MV sat) 4. Severe TR  Plan/Discussion: Surgical eval for TVR.    Laboratory Data:  High Sensitivity Troponin:   Recent Labs  Lab 07/13/20 1219 07/13/20 1317  TROPONINIHS 14 19*     Chemistry Recent Labs  Lab 07/13/20 1035  NA 129*  K 3.2*  CL 94*  CO2 21*  GLUCOSE 138*  BUN 11  CREATININE 0.74  CALCIUM 8.6*  GFRNONAA >60  ANIONGAP 14    Recent Labs  Lab 07/13/20 1035  PROT 7.1  ALBUMIN 2.3*  AST 18  ALT 7  ALKPHOS 138*  BILITOT 1.1   Hematology Recent Labs  Lab 07/13/20 1035  WBC 19.1*   RBC 3.04*  HGB 9.4*  HCT 28.5*  MCV 93.8  MCH 30.9  MCHC 33.0  RDW 14.3  PLT 322   BNP Recent Labs  Lab 07/13/20 1219  BNP 157.1*    DDimer No results for input(s): DDIMER in the last 168 hours.   Radiology/Studies:  DG Chest 2 View  Result Date: 07/13/2020 CLINICAL DATA:  Shortness of breath EXAM: CHEST - 2 VIEW COMPARISON:  07/26/2018 FINDINGS: Low lung volumes. Bibasilar airspace opacities. Mild cardiomegaly. No effusions. No acute bony abnormality. IMPRESSION: Low lung volumes with bibasilar airspace opacities concerning for pneumonia. Mild cardiomegaly. Electronically Signed   By: Rolm Baptise M.D.   On: 07/13/2020 11:33   [place] MFM OB COMP + 14 WK  Result Date: 07/13/2020 ----------------------------------------------------------------------  OBSTETRICS REPORT                       (Signed Final 07/13/2020 02:48 pm) ---------------------------------------------------------------------- Patient Info  ID #:       [id number]                          D.O.B.:  [birth year] (34 yrs)  Name:       [name]              Visit Date: 07/13/2020 02:08 pm ---------------------------------------------------------------------- Performed By  Attending:        Sander Nephew      Ref. Address:     [facility]                    MD  GYN  Performed By:     Valda Favia          [facility]                    [place]  Referred By:      Annalee Genta DO ---------------------------------------------------------------------- Orders  #  Description                           Code        Ordered By  1  [place] MFM OB COMP + 14 WK                76805.01    Janyth Pupa ----------------------------------------------------------------------  #  Order #                     Accession #                Episode #  1  [id number]                   [id number]                  [id number] ---------------------------------------------------------------------- Indications  [redacted] weeks gestation of pregnancy                Z3A.26  Encounter for uncertain dates                  I14.43  Drug use complicating pregnancy, third         O57.323  trimester  Advanced maternal age multigravida 57+,        O3.523  third trimester  Previous cesarean delivery, antepartum x1      O34.219  Abdominal pain in pregnancy                    O99.89 ---------------------------------------------------------------------- Fetal Evaluation  Num Of Fetuses:         1  Fetal Heart Rate(bpm):  169  Cardiac Activity:       Observed  Presentation:           Breech  Placenta:               Anterior  P. Cord Insertion:      Not well visualized  Amniotic Fluid  AFI FV:      Within normal limits                              Largest Pocket(cm)                              5.6 ---------------------------------------------------------------------- Biometry  BPD:      64.8  mm     G. Age:  26w 1d         41  %    CI:        75.49   %    70 - 86  FL/HC:      18.8   %    18.6 - 20.4  HC:      236.5  mm     G. Age:  25w 5d         14  %    HC/AC:      1.01        1.04 - 1.22  AC:      234.2  mm     G. Age:  27w 5d         86  %    FL/BPD:     68.7   %    71 - 87  FL:       44.5  mm     G. Age:  24w 5d          5  %    FL/AC:      19.0   %    20 - 24  HUM:      42.2  mm     G. Age:  25w 3d         27  %  Est. FW:     930  gm      2 lb 1 oz     49  % ---------------------------------------------------------------------- OB History  Gravidity:    7         Term:   3        Prem:   0        SAB:   3  TOP:          0       Ectopic:  0        Living: 3 ---------------------------------------------------------------------- Gestational Age  U/S Today:     26w 1d                                        EDD:   10/18/20  Best:          26w 1d     Det. By:  U/S (07/13/20)           EDD:    10/18/20 ---------------------------------------------------------------------- Anatomy  Cranium:               Appears normal         LVOT:                   Not well visualized  Cavum:                 Appears normal         Aortic Arch:            Not well visualized  Ventricles:            Not well visualized    Ductal Arch:            Not well visualized  Choroid Plexus:        Appears normal         Diaphragm:              Appears normal  Cerebellum:            Not well visualized    Stomach:                Appears normal, left  sided  Posterior Fossa:       Not well visualized    Abdomen:                Appears normal  Nuchal Fold:           Not applicable (>81    Abdominal Wall:         Not well visualized                         wks GA)  Face:                  Not well visualized    Cord Vessels:           Appears normal (3                                                                        vessel cord)  Lips:                  Not well visualized    Kidneys:                Not well visualized  Palate:                Not well visualized    Bladder:                Appears normal  Thoracic:              Appears normal         Spine:                  Not well visualized  Heart:                 Not well visualized    Upper Extremities:      Visualized  RVOT:                  Not well visualized    Lower Extremities:      Visualized  Other:  Fetus appears to be a female. Technically difficult due to maternal          habitus and fetal position. ---------------------------------------------------------------------- Cervix Uterus Adnexa  Cervix  Not visualized (advanced GA >24wks)  Uterus  No abnormality visualized.  Right Ovary  No adnexal mass visualized.  Left Ovary  No adnexal mass visualized.  Cul De Sac  No free fluid seen.  Adnexa  No abnormality visualized. ---------------------------------------------------------------------- Impression   Single intrauterine pregnancy here for a detailed anatomy  due new onset abdominal pain and known substance abuse (  Heroine).  Normal anatomy with measurements consistent with today's  examination, Ms. Falconi has an uncertain LMP.  There is good fetal movement and amniotic fluid volume  Suboptimal views of the fetal anatomy were obtained  secondary to fetal position and maternal ability to lay flat. ---------------------------------------------------------------------- Recommendations  Repeat growth and anatomy in 3 weeks. ----------------------------------------------------------------------               Sander Nephew, MD Electronically Signed Final Report   07/13/2020 02:48 pm ----------------------------------------------------------------------    Assessment and Plan:   Acute on Chronic Systolic CHF with  Mostly Right Sided CHF (Likely Secondary to Severe TR) - Patient presents with progressive dyspnea and lower extremity edema over the last 2-3 weeks. - BNP mildly elevated at 157.  - Chest x-ray showed mild cardiomegaly with low lung volumes and bibasilar airspace opacities concerning for pneumonia. - Echo showed LVEF of 55% with normal wall motion. RV severely enlarged with moderately reduced systolic function and severely elevated PASP of 92.48mHg. Severe TR noted but no vegetation visualized.  - Patient has significant lower extremity edema on exam. She has some mild crackles in bases.  - Discussed with Dr. BHaroldine Laws- OK to continue fluids at this time given acute sepsis. Once patient stabilizes some, we can start IV diuresis. Will order TED hose for now. - Continue to monitor daily weight, strict I/O's, and renal function.  Recurrent Tricuspid Endocarditis with Severe TR - Recurrent endocarditis of tricuspid valve in 11/2017 and again in 06/2018 secondary to IV drug use. He was seen by Dr. GServando Snarein 05/2019 for possible surgical repair. However, patient was lost to follow-up and  unfortunately started using drugs again. - Echo this admission shows severe TR but no signs of vegetation. - Will need repeat TEE this admission once she stabilizes some.  Demand Ischemia - High-sensitivity troponin 14 >> 19. Not consistent with ACS. Secondary to CHF and sepsis. - EKG shows no acute ST changes.  - Patient does report some left sided chest pain but do not suspect cardiac in nature. She had clean coronary arteries on cath in 05/2019.  - Do not anticipate any additional ischemic work-up.  Sinus Tachycardia - Suspect secondary to sepsis and withdrawal. - Suspect rates will improve with treatment of underlying illness.  - No need for AV nodal agents.  Sepsis Secondary to UTI and Possible Pneumonia - WBC 19.1 - Lactic acid 3.2 >> 2.0.  - Blood and urine cultures pending. - Started on Vancomycin and Cefepime. - Started on lactated ringers 75cc/hr. OK for now but will need to monitor closely with history of CHF. - Management per critical care.  Hypokalemia - Potassium 3.2 on admission. - Being repleted by primary team. - Monitor closely.  Hyponatremia - Sodium 129 on admission. - Recived 500cc NS bolus and then started on LR. - Monitor closely.  Prolonged QTc - QTc on 636 ms. - Potassium slightly low. Being repleted by primary team. - Magnesium pending. - Avoid QT prolonging agents. - Repeat EKG in the morning.  Polysubstance Abuse IV Drug Use - Long history of IV drug use. Patient reports using mostly Fentanyl (at least 3 grams per day) but also notes using Heroin and cocaine. - Urine drug screen pending. - Management per primary team.  Single Uterine Pregnancy - Patient is [redacted] weeks pregnant. - OB consulted.  Risk Assessment/Risk Scores:   New York Heart Association (NYHA) Functional Class NYHA Class IV  For questions or updates, please contact [facility] Please consult [email or web address] for contact info under    Signed, CDarreld Mclean PA-C   07/13/2020 4:33 PM   Agree with above.   35y/o woman with IVDA with h/o recurrent TV endocarditis complicated by systolic HF and widespread septic emboli. I last saw her at the time of TEE and cardiac cath in 1/21 in preparation for TV surgery.   Unfortunately she was then lost to f/u. She presents today with severe sepsis, UTI, bilateral PNA and RV failure in setting of IVDA and 26 week pregnancy. Initial lactic acid was 3.2 with WBC 19.1k.  Lactate now down to 2.0 with abx and IVF  Echo shows normalized LVEF 55-60% with markedly enlarged RV with moderate RV dysfunction. There is severe TR with severely elevated RVSP with no obvious vegetation on TTE.   Fetal u/s confirms viability of 26w fetus  On exam she is uncomfortable, agitated, tachypneic and diaphoretic HEENT: normal Neck: supple. JVP to ear with prominent CV waves Carotids 2+ bilat; no bruits. No lymphadenopathy or thryomegaly appreciated. Cor: PMI nondisplaced. Tachy regular . 3/6 TR Lungs: tachypneic decreased at bases Abdomen: + gravid Right flank tenderness No hepatosplenomegaly. No bruits or masses. Good bowel sounds. Extremities: no cyanosis, clubbing, rash, 3+ edema with diffuse rash. + needle tracks Neuro: alert & orientedx3, cranial nerves grossly intact. moves all 4 extremities w/o difficulty. Affect agitated  She has severe sepsis and probable recurrent endocarditis and RV failure. Agree with broad spectrum abx as above (prefer cefepime over Unasyn due to salt/volume load with Unasyn). Follow cultures. Agree with IVF for now but will eventually need IV diuresis. Place TED hose to reduce 3rd spacing. Will eventually need to consider TEE though I am not sure it will change management as would be hard to justify TV surgery in setting of recurrent relapse. Will need to coordinate care/meds with OB to help maximize ongoing viability of her fetus.   CRITICAL CARE Performed by: Glori Bickers  Total critical care time: 35  minutes  Critical care time was exclusive of separately billable procedures and treating other patients.  Critical care was necessary to treat or prevent imminent or life-threatening deterioration.  Critical care was time spent personally by me (independent of midlevel providers or residents) on the following activities: development of treatment plan with patient and/or surrogate as well as nursing, discussions with consultants, evaluation of patient's response to treatment, examination of patient, obtaining history from patient or surrogate, ordering and performing treatments and interventions, ordering and review of laboratory studies, ordering and review of radiographic studies, pulse oximetry and re-evaluation of patient's condition.   Glori Bickers, MD  7:52 PM

## 2020-07-13 NOTE — ED Notes (Signed)
Beside [place] complete.

## 2020-07-13 NOTE — ED Notes (Signed)
Rapid OB at bedside

## 2020-07-13 NOTE — H&P (Signed)
NAME:  Kelly [name], MRN:  [id number], DOB:  21-Jun-1985, LOS: 0 ADMISSION DATE:  07/13/2020, CONSULTATION DATE:  07/13/2020 REFERRING MD:  Dr. Lorin Mercy, CHIEF COMPLAINT:  Sepsis  Brief History:  35 year old female with PMH of IVDA, TV IE 11/2017 and recurrent 06/2018, epidural abscess (2019), MSSA bacteremia 2019 presenting with right sided abd pain and shortness of breath.   History of Present Illness:   35 year old female with prior history of former smoker, current IVDA/ polysubstance abuse with heroin/ fentanyl (relapsed 5 months ago, last use 3/2 am), previous TV IE 11/2017 and recurrent 06/2018, epidural abscess (2019), MSSA bacteremia 2019,  HTN, and Bipolar presenting with abdominal pain and lower extremity edema.   On arrival, noted to be 88% on room air which increased with 2L [place], afebrile, tachycardic 117, and normotensive.  Labs significant for WBC 19.1, Hgb 9.4, Hct 28.5, lactic acid 3.2, Na 129, K 3.2, Cl 94, bicarb 21, alk phos 138, albumin 2.3, BNP 157, CK 68, troponin hs 14-> 19, UA with large leukocytes, positive nitrates, > 50 WBC  positive HCG 12,898 with POCUS ultrasound showing large fetus with heart movement.  Patient was unsure how long she had been pregnant.  Denied any vaginal bleeding, contractions, or fluid loss; no prior prenatal care.  OB consulted and placed on toco monitoring and per OB, patient is 26wk1d gestation and cleared from [facility] standpoint. CXR with low volumes and bibasilar airspace opacities concerning for pneumonia.  She was cultured and started on ceftriaxone and azithromycin.     Past Medical History:  Former smoker, IVDA- polysubstance abuse (heroin, fentanyl, cocaine), previous TV IE and epidural abscess (2019), MSSA bacteremia 11/2017, HTN, Biopolar, ADD, scoliosis with spinal rods  Significant Hospital Events:  3/2 Admit  Consults:  OB Cards ID PCCM  Procedures:   Significant Diagnostic Tests:  3/2 CXR>>Low lung volumes with bibasilar airspace  opacities concerning for Pneumonia. Mild cardiomegaly.   3/2 OB US >>  Single intrauterine pregnancy here for a detailed anatomy  due new onset abdominal pain and known substance abuse ( Heroine).  Normal anatomy with measurements consistent with today's examination, Ms. Everetts has an uncertain LMP.  There is good fetal movement and amniotic fluid volume.  Suboptimal views of the fetal anatomy were obtained secondary to fetal position and maternal ability to lay flat.  3/2 TTE >>  Micro Data:  3/2 SARS/ flu >> neg 3/2 BCx >> 3/2 UC >>  Antimicrobials:  3/2 azithro  3/2 ceftriaxone 3/2 Vancomycin>> 3/2 Cefepime>>  Interim History / Subjective:  Pt tachycardic with severe pain and shortness of breath, PCCM consulted secondary to tenuous status  Objective   Blood pressure 128/72, pulse (!) 117, temperature 99.3 F (37.4 C), temperature source Oral, resp. rate (!) 35, SpO2 100 %.       No intake or output data in the 24 hours ending 07/13/20 1637 There were no vitals filed for this visit.  General:  Distressed, tachypneic, sitting up on the side of the bed for echo and requesting pain medication HEENT: MM pink/moist Neuro: awake, responsive, no obvious neuro deficits, highly anxious and difficult to converse CV: s1s2 tachycardic, systolic murmur PULM:  Ronchi and decreased air entry LLL, tachypneic, on 3L [place] GI: soft, gravid uterus Extremities: warm/dry, 3+ edema, L back pain and R flank pain Skin: no rashes or lesions   Resolved Hospital Problem list     Assessment & Plan:   Sepsis secondary to UTI and PNA,  rule out acute IE/ bacteremia with active IVDA Resumed IVDA about 5 months ago P: -admit to ICU, at risk for hemodynamic decompensation - pending stat TTE - empiric Vancomycin/Cefepime - ID and Cardiology consulted, new vs ?old murmur with hx of prior TV IE - scheduled tylenol - dilaudid for pain  - check HIV/ RPR  -follow culture results -She is not in  shock and does not appear volume depleted, hold off sepsis bolus of 30cc/kg pending echo -LR 75cc/hr -trend lactic acid     Hx of IVDA/ polysubstance abuse - last use 3/2 am, heroin/ fentanyl  - requesting methadone, cant talk unless she has pain medication - dilaudid for pain  - scheduled tylenol - monitor for withdrawals     Hyponatremia Hypokalemia  Sodium 129, K 3.2 P: -received 500cc NS bolus, check repeat BMP this evening -replete K and check mag   Prolonged QTc - avoid QTc prolonging agents - electrolyte correction    Single intrauterine pregnancy OB consulted P: - 26wk1d gestation by OB US 3/2 - No OB intervention warranted at this time    Best practice (evaluated daily)  Diet: regular Pain/Anxiety/Delirium protocol (if indicated): Dilaudid VAP protocol (if indicated): n/a DVT prophylaxis: lovenox  GI prophylaxis: n/a Glucose control: SSI Mobility: with assist Disposition:ICU  Goals of Care:  Last date of multidisciplinary goals of care discussion: Family and staff present:  Summary of discussion:  Follow up goals of care discussion due: 3/9 Code Status: Full Code  Labs   CBC: Recent Labs  Lab 07/13/20 1035  WBC 19.1*  HGB 9.4*  HCT 28.5*  MCV 93.8  PLT 929    Basic Metabolic Panel: Recent Labs  Lab 07/13/20 1035  NA 129*  K 3.2*  CL 94*  CO2 21*  GLUCOSE 138*  BUN 11  CREATININE 0.74  CALCIUM 8.6*   GFR: CrCl cannot be calculated (Unknown ideal weight.). Recent Labs  Lab 07/13/20 1035 07/13/20 1219  WBC 19.1*  --   LATICACIDVEN  --  3.2*    Liver Function Tests: Recent Labs  Lab 07/13/20 1035  AST 18  ALT 7  ALKPHOS 138*  BILITOT 1.1  PROT 7.1  ALBUMIN 2.3*   Recent Labs  Lab 07/13/20 1035  LIPASE 19   No results for input(s): AMMONIA in the last 168 hours.  ABG    Component Value Date/Time   PHART 7.325 (L) 05/21/2019 1226   PCO2ART 34.3 05/21/2019 1226   PO2ART 147.0 (H) 05/21/2019 1226   HCO3  17.9 (L) 05/21/2019 1226   TCO2 19 (L) 05/21/2019 1226   ACIDBASEDEF 7.0 (H) 05/21/2019 1226   O2SAT 99.0 05/21/2019 1226     Coagulation Profile: No results for input(s): INR, PROTIME in the last 168 hours.  Cardiac Enzymes: Recent Labs  Lab 07/13/20 1219  CKTOTAL 68    HbA1C: Hgb A1c MFr Bld  Date/Time Value Ref Range Status  06/26/2019 06:12 PM 5.1 4.8 - 5.6 % Final    Comment:    (NOTE) Pre diabetes:          5.7%-6.4% Diabetes:              >6.4% Glycemic control for   <7.0% adults with diabetes   09/04/2018 11:03 AM 5.3 4.8 - 5.6 % Final    Comment:             Prediabetes: 5.7 - 6.4          Diabetes: >6.4  Glycemic control for adults with diabetes: <7.0     CBG: No results for input(s): GLUCAP in the last 168 hours.  Review of Systems:   Negative except as noted in HPI  Past Medical History:  She,  has a past medical history of Abscess in epidural space of L2-L5 lumbar spine (12/05/2017), Abscess of breast, ADD (attention deficit disorder), Back pain, Bipolar 1 disorder ([place]), Endocarditis (2020), Hypertension, Leukocytosis, Polysubstance abuse ([place]), Pyelonephritis affecting pregnancy (05/24/2016), Sepsis ([place]) (12/02/2017), and Suicidal overdose ([place]).   Surgical History:   Past Surgical History:  Procedure Laterality Date  . BACK SURGERY     scoliosis-in care everywhere 08/1999  . CESAREAN SECTION    . CHOLECYSTECTOMY  March 2015  . FOREIGN BODY REMOVAL Right 05/28/2016   Procedure: REMOVAL FOREIGN BODY RIGHT ARM;  Surgeon: Meredith Pel, MD;  Location: [place];  Service: Orthopedics;  Laterality: Right;  . IRRIGATION AND DEBRIDEMENT ABSCESS Right 07/13/2014   Procedure: IRRIGATION AND DEBRIDEMEN TRIGHT BREAST ABSCESS;  Surgeon: Donnie Mesa, MD;  Location: [place];  Service: General;  Laterality: Right;  . LUMBAR LAMINECTOMY/DECOMPRESSION MICRODISCECTOMY N/A 12/05/2017   Procedure: LUMBAR LAMINECTOMY FOR EPIDURAL ABSCESS;  Surgeon: Newman Pies, MD;  Location: [place];  Service: Neurosurgery;  Laterality: N/A;  . MULTIPLE EXTRACTIONS WITH ALVEOLOPLASTY Bilateral 07/03/2018   Procedure: Extraction of tooth #'s 30 and 31 with alveoloplasty;  Surgeon: Lenn Cal, DDS;  Location: [place];  Service: Oral Surgery;  Laterality: Bilateral;  . RADIOLOGY WITH ANESTHESIA N/A 12/05/2017   Procedure: MRI WITH ANESTHESIA OF [facility] AND LUMBAR SPINE WITH AND WITHOUT;  Surgeon: Radiologist, Medication, MD;  Location: [place];  Service: Radiology;  Laterality: N/A;  . RIGHT/LEFT HEART CATH AND CORONARY ANGIOGRAPHY N/A 05/21/2019   Procedure: RIGHT/LEFT HEART CATH AND CORONARY ANGIOGRAPHY;  Surgeon: Jolaine Artist, MD;  Location: [place] [facility];  Service: Cardiovascular;  Laterality: N/A;  . TEE WITHOUT CARDIOVERSION N/A 05/21/2019   Procedure: TRANSESOPHAGEAL ECHOCARDIOGRAM (TEE);  Surgeon: Jolaine Artist, MD;  Location: [name] ENDOSCOPY;  Service: Cardiovascular;  Laterality: N/A;     Social History:   reports that she quit smoking about 2 years ago. Her smoking use included cigarettes. She has a 13.00 pack-year smoking history. She has never used smokeless tobacco. She reports previous alcohol use. She reports current drug use. Drugs: Cocaine, Heroin, IV, and Fentanyl.   Family History:  Her family history includes Diabetes in her father and mother; Hypertension in her father. There is no history of Heart attack, Hyperlipidemia, or Sudden death.   Allergies Allergies  Allergen Reactions  . Hydrocodone Itching  . Morphine And Related Nausea And Vomiting     Home Medications  Prior to Admission medications   Not on File     Critical care time:  50 minutes     CRITICAL CARE Performed by: Otilio Carpen Vinal Rosengrant   Total critical care time: 50 minutes  Critical care time was exclusive of separately billable procedures and treating other patients.  Critical care was necessary to treat or prevent imminent or life-threatening  deterioration.  Critical care was time spent personally by me on the following activities: development of treatment plan with patient and/or surrogate as well as nursing, discussions with consultants, evaluation of patient's response to treatment, examination of patient, obtaining history from patient or surrogate, ordering and performing treatments and interventions, ordering and review of laboratory studies, ordering and review of radiographic studies, pulse oximetry and re-evaluation of patient's condition.  Otilio Carpen Uniqua Kihn, PA-C [facility] See Amion for pager If no response to pager , please call [phone] until 7pm After 7:00 pm call Elink  [phone]?[place]

## 2020-07-13 NOTE — ED Notes (Signed)
Patient transported to X-ray 

## 2020-07-13 NOTE — Progress Notes (Signed)
eLink Physician-Brief Progress Note Patient Name: Kelly [name] DOB: 01-Jul-1985 MRN: [id number]   Date of Service  07/13/2020  HPI/Events of Note  Nursing states that patient is in "excrutiatiing pain". On video assessment, the patient is lethargic. PCCM ground team at bedside evaluating patient.   eICU Interventions  Patient does not need further narcotics at this time.      Intervention Category Major Interventions: Other:  Lysle Dingwall 07/13/2020, 8:54 PM

## 2020-07-13 NOTE — ED Notes (Signed)
Report attempted 

## 2020-07-13 NOTE — Consult Note (Addendum)
OBSTETRICS AND GYNECOLOGY ATTENDING CONSULT NOTE  Consult Date: 07/13/2020  Reason for Consult: Pregnancy Consulting Provider: Dr.  Karle Starch   Assessment/Plan: -Fetal well being reassuring for 26wk by bedside US and doppler.  Currently in 2nd trimester and would not warranted obstetrical intervention at this time. -Will continue daily rounding. Daily FHR assessment will be done by our Rapid Response OB Nurse as directed.   -MFM also consulted   Appreciate care of [place] by her primary team   Please call [phone] [facility] OB/GYN Consult Attending Monday-Friday 8am - 5pm) or [phone] [facility] OB/GYN Attending On Call all day, every day) for any obstetric concerns at any time.  Thank you for involving [place] in the care of this patient.  Total consultation time including face-to-face time with patient (>50% of time), reviewing chart and documentation: less than 30 minutes  History of Present Illness: Kelly [name] is an 35 y.o. [phone] female dated by today's [place].  Pt reports significant pain, mostly along her side and stated that she was in too much pain to talk.  Some information gathered per RN- Pearletha Forge. Per RN- She presented to the ER due to SOB and swelling that has been going on for the past few days. She notes SOB, LE edema as well as fevers at home.  Additionally notes pain along her side- mostly on the right.   History of polysubstance abuse, relapse approximately 76mo ago, mostly heroin and fentanyl, occasional cocaine, last use early this am. Denies abdominal pain, no vaginal bleeding.  +FM.  No prior prenatal care  Pertinent OB/GYN History: No LMP recorded.  2009- primary C-section, NRFHT VBAC x3 uncomplicated SAB x 2, TAB x 1 OB History  Gravida Para Term Preterm AB Living  6 2 2  0 3 2  SAB IAB Ectopic Multiple Live Births  2 1 0 0 2    # Outcome Date GA Lbr Len/2nd Weight Sex Delivery Anes PTL Lv  6 Gravida           5 Term 2017     VBAC   LIV  4 IAB  12/25/09             Complications: Retained products of conception after miscarriage  3 Term 05/2007     CS-LTranv   LIV     Complications: Failure to Progress in First Stage, Gestational hypertension  2 SAB           1 SAB             Patient Active Problem List   Diagnosis Date Noted  . MDD (major depressive disorder), recurrent severe, without psychosis ([place] 06/26/2019  . Oth psychoactive substance abuse w mood disorder ([place] 06/26/2019  . Unwanted fertility 09/04/2018  . Chronic systolic heart failure ([place] 08/29/2018  . Acute bilateral low back pain   . Hematoma   . PICC (peripherally inserted central catheter) flush   . Infection due to Streptococcus mitis group 07/20/2018  . History of VBAC 07/16/2018  . Endocarditis 07/12/2018  . Dental caries 07/03/2018  . 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 Preexisting hypertension complicating pregnancy, antepartum 06/29/2018  . Supervision of other high risk pregnancies, third trimester 06/27/2018  . Rubella non-immune status, antepartum 06/23/2018  . Influenza B 06/17/2018  . Bacteremia due to Enterococcus 06/15/2018  . Multifocal pneumonia 06/14/2018  . 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 History of bacterial endocarditis 06/14/2018  . Tricuspid regurgitation 06/14/2018  . 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 Difficult intravenous access   . IVDU (intravenous drug user)   . Bipolar disorder ([place]) 07/31/2016  . Major depressive disorder, recurrent episode with anxious distress ([place]) 07/29/2016  . Foreign body granuloma of soft tissue, NEC, right upper arm   . Polysubstance abuse ([place]) 05/25/2016  . Chronic hypertension affecting pregnancy 05/25/2016  . History of cesarean delivery affecting pregnancy 05/25/2016  . Abscess of right breast 07/13/2014  . Opioid use disorder, severe, dependence ([place]) 08/25/2013  . Unspecified episodic mood disorder 08/25/2013  . ADHD  (attention deficit hyperactivity disorder) 08/25/2013  . Cocaine abuse ([place]) 08/25/2013  . Thoracic back pain 01/29/2013  . CROHN'S DISEASE 05/03/2005  . COLONIC POLYPS 02/28/2003    Past Medical History:  Diagnosis Date  . 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 Abscess of breast    rt breast  . ADD (attention deficit disorder)   . Back pain   . Bipolar 1 disorder ([place])   . Hypertension   . Leukocytosis   . Polysubstance abuse ([place])   . Pyelonephritis affecting pregnancy 05/24/2016  . Sepsis ([place]) 12/02/2017  . Suicidal overdose [facility])     Past Surgical History:  Procedure Laterality Date  . BACK SURGERY     scoliosis-in care everywhere 08/1999  . CESAREAN SECTION    . CHOLECYSTECTOMY  March 2015  . FOREIGN BODY REMOVAL Right 05/28/2016   Procedure: REMOVAL FOREIGN BODY RIGHT ARM;  Surgeon: Meredith Pel, MD;  Location: [place];  Service: Orthopedics;  Laterality: Right;  . IRRIGATION AND DEBRIDEMENT ABSCESS Right 07/13/2014   Procedure: IRRIGATION AND DEBRIDEMEN TRIGHT BREAST ABSCESS;  Surgeon: Donnie Mesa, MD;  Location: [place];  Service: General;  Laterality: Right;  . LUMBAR LAMINECTOMY/DECOMPRESSION MICRODISCECTOMY N/A 12/05/2017   Procedure: LUMBAR LAMINECTOMY FOR EPIDURAL ABSCESS;  Surgeon: Newman Pies, MD;  Location: [place];  Service: Neurosurgery;  Laterality: N/A;  . MULTIPLE EXTRACTIONS WITH ALVEOLOPLASTY Bilateral 07/03/2018   Procedure: Extraction of tooth #'s 30 and 31 with alveoloplasty;  Surgeon: Lenn Cal, DDS;  Location: [place];  Service: Oral Surgery;  Laterality: Bilateral;  . RADIOLOGY WITH ANESTHESIA N/A 12/05/2017   Procedure: MRI WITH ANESTHESIA OF [place] AND LUMBAR SPINE WITH AND WITHOUT;  Surgeon: Radiologist, Medication, MD;  Location: [place];  Service: Radiology;  Laterality: N/A;  . RIGHT/LEFT HEART CATH AND CORONARY ANGIOGRAPHY N/A 05/21/2019   Procedure: RIGHT/LEFT HEART CATH AND CORONARY ANGIOGRAPHY;  Surgeon:  Jolaine Artist, MD;  Location: [place] [facility];  Service: Cardiovascular;  Laterality: N/A;  . TEE WITHOUT CARDIOVERSION N/A 05/21/2019   Procedure: TRANSESOPHAGEAL ECHOCARDIOGRAM (TEE);  Surgeon: Jolaine Artist, MD;  Location: [facility] END[facility];  Service: Cardiovascular;  Laterality: N/A;    Family History  Problem Relation Age of Onset  . Diabetes Mother   . Diabetes Father   . Hypertension Father   . Heart attack Neg Hx   . Hyperlipidemia Neg Hx   . Sudden death Neg Hx     Social History:  reports that she quit smoking about 2 years ago. Her smoking use included cigarettes. She has a 13.00 pack-year smoking history. She has never used smokeless tobacco. She reports previous alcohol use. She reports current drug use. Drugs: Cocaine, Heroin, and IV.  Allergies:  Allergies  Allergen Reactions  . Hydrocodone Itching  . Morphine And Related Nausea And Vomiting    Medications: no meds  Review of Systems: Pertinent items are  noted in HPI.  Focused Physical Examination: BP 125/70   Pulse (!) 109   Temp 99.3 F (37.4 C) (Oral)   Resp (!) 24   SpO2 99%  CONSTITUTIONAL: appears in mild distress, sitting upright in bed  HENT:  Normocephalic, atraumatic.  Poor dentition noted EYES: Conjunctivae and EOM are normal NECK: Supple no masses SKIN: Skin is warm and dry.  PSYCHIATRIC: Appears agitated CARDIOVASCULAR: +tachycardia RESPIRATORY: Decreased breath sound bilaterally ABDOMEN: Soft, Gravid, no uterine/abdominal tenderness, Right flank pain  PELVIC: deferred EXT: 3+ LE edema noted  Labs and Imaging: Results for orders placed or performed during the hospital encounter of 07/13/20 (from the past 72 hour(s))  Lipase, blood     Status: None   Collection Time: 07/13/20 10:35 AM  Result Value Ref Range   Lipase 19 11 - 51 U/L    Comment: Performed at [facility], [place] [address], [place], [place] [address]  Comprehensive metabolic panel     Status: Abnormal    Collection Time: 07/13/20 10:35 AM  Result Value Ref Range   Sodium 129 (L) 135 - 145 mmol/L   Potassium 3.2 (L) 3.5 - 5.1 mmol/L   Chloride 94 (L) 98 - 111 mmol/L   CO2 21 (L) 22 - 32 mmol/L   Glucose, Bld 138 (H) 70 - 99 mg/dL    Comment: Glucose reference range applies only to samples taken after fasting for at least 8 hours.   BUN 11 6 - 20 mg/dL   Creatinine, Ser 0.74 0.44 - 1.00 mg/dL   Calcium 8.6 (L) 8.9 - 10.3 mg/dL   Total Protein 7.1 6.5 - 8.1 g/dL   Albumin 2.3 (L) 3.5 - 5.0 g/dL   AST 18 15 - 41 U/L   ALT 7 0 - 44 U/L   Alkaline Phosphatase 138 (H) 38 - 126 U/L   Total Bilirubin 1.1 0.3 - 1.2 mg/dL   GFR, Estimated >60 >60 mL/min    Comment: (NOTE) Calculated using the CKD-EPI Creatinine Equation (2021)    Anion gap 14 5 - 15    Comment: Performed at [facility], [place] [address], [place], [place] [address]  CBC     Status: Abnormal   Collection Time: 07/13/20 10:35 AM  Result Value Ref Range   WBC 19.1 (H) 4.0 - 10.5 K/uL   RBC 3.04 (L) 3.87 - 5.11 MIL/uL   Hemoglobin 9.4 (L) 12.0 - 15.0 g/dL   HCT 28.5 (L) 36.0 - 46.0 %   MCV 93.8 80.0 - 100.0 fL   MCH 30.9 26.0 - 34.0 pg   MCHC 33.0 30.0 - 36.0 g/dL   RDW 14.3 11.5 - 15.5 %   Platelets 322 150 - 400 K/uL   nRBC 0.0 0.0 - 0.2 %    Comment: Performed at [facility], [place] [address] [place], [place] [address]  I-Stat beta hCG blood, ED     Status: Abnormal   Collection Time: 07/13/20 11:03 AM  Result Value Ref Range   I-stat hCG, quantitative >2,000.0 (H) <5 mIU/mL   Comment 3            Comment:   GEST. AGE      CONC.  (mIU/mL)   <=1 WEEK        5 - 50     2 WEEKS       50 - 500     3 WEEKS       100 - 10,000  4 WEEKS     1,000 - 30,000        FEMALE AND NON-PREGNANT FEMALE:     LESS THAN 5 mIU/mL   Resp Panel by RT-PCR (Flu A&B, Covid) Nasopharyngeal Swab     Status: None   Collection Time: 07/13/20 11:21 AM   Specimen: Nasopharyngeal Swab; Nasopharyngeal(NP) swabs in vial transport  medium  Result Value Ref Range   SARS Coronavirus 2 by RT PCR NEGATIVE NEGATIVE    Comment: (NOTE) SARS-CoV-2 target nucleic acids are NOT DETECTED.  The SARS-CoV-2 RNA is generally detectable in upper respiratory specimens during the acute phase of infection. The lowest concentration of SARS-CoV-2 viral copies this assay can detect is 138 copies/mL. A negative result does not preclude SARS-Cov-2 infection and should not be used as the sole basis for treatment or other patient management decisions. A negative result may occur with  improper specimen collection/handling, submission of specimen other than nasopharyngeal swab, presence of viral mutation(s) within the areas targeted by this assay, and inadequate number of viral copies(<138 copies/mL). A negative result must be combined with clinical observations, patient history, and epidemiological information. The expected result is Negative.  Fact Sheet for Patients:  EntrepreneurPulse.com.au  Fact Sheet for Healthcare Providers:  IncredibleEmployment.be  This test is no t yet approved or cleared by the [place] FDA and  has been authorized for detection and/or diagnosis of SARS-CoV-2 by FDA under an Emergency Use Authorization (EUA). This EUA will remain  in effect (meaning this test can be used) for the duration of the COVID-19 declaration under Section 564(b)(1) of the Act, 21 U.S.C.section 360bbb-3(b)(1), unless the authorization is terminated  or revoked sooner.       Influenza A by PCR NEGATIVE NEGATIVE   Influenza B by PCR NEGATIVE NEGATIVE    Comment: (NOTE) The Xpert Xpress SARS-CoV-2/FLU/RSV plus assay is intended as an aid in the diagnosis of influenza from Nasopharyngeal swab specimens and should not be used as a sole basis for treatment. Nasal washings and aspirates are unacceptable for Xpert Xpress SARS-CoV-2/FLU/RSV testing.  Fact Sheet for  Patients: EntrepreneurPulse.com.au  Fact Sheet for Healthcare Providers: IncredibleEmployment.be  This test is not yet approved or cleared by the [place] FDA and has been authorized for detection and/or diagnosis of SARS-CoV-2 by FDA under an Emergency Use Authorization (EUA). This EUA will remain in effect (meaning this test can be used) for the duration of the COVID-19 declaration under Section 564(b)(1) of the Act, 21 U.S.C. section 360bbb-3(b)(1), unless the authorization is terminated or revoked.  Performed at [facility], [place] [address] [place], [place] [address]   Lactic acid, plasma     Status: Abnormal   Collection Time: 07/13/20 12:19 PM  Result Value Ref Range   Lactic Acid, Venous 3.2 (HH) 0.5 - 1.9 mmol/L    Comment: CRITICAL RESULT CALLED TO, READ BACK BY AND VERIFIED WITH: [email or web address] [id number] 07/13/20 CLARK,S Performed at [facility], [place] [address] [place],  [address]   CK     Status: None   Collection Time: 07/13/20 12:19 PM  Result Value Ref Range   Total CK 68 38 - 234 U/L    Comment: Performed at [facility], [place] [address], [place], [place] [address]  Troponin I (High Sensitivity)     Status: None   Collection Time: 07/13/20 12:19 PM  Result Value Ref Range   Troponin I (High Sensitivity) 14 <18 ng/L    Comment: (NOTE) Elevated  high sensitivity troponin I (hsTnI) values and significant  changes across serial measurements may suggest ACS but many other  chronic and acute conditions are known to elevate hsTnI results.  Refer to the "Links" section for chest pain algorithms and additional  guidance. Performed at [facility], [place] [address] [place] [address], [place], [place] [address]   Brain natriuretic peptide     Status: Abnormal   Collection Time: 07/13/20 12:19 PM  Result Value Ref Range   B Natriuretic Peptide 157.1 (H) 0.0 - 100.0 pg/mL    Comment: Performed at [place] [address], [place], [place] [address]  hCG, quantitative, pregnancy     Status: Abnormal   Collection Time: 07/13/20 12:19 PM  Result Value Ref Range   hCG, Beta Chain, Quant, S 12,898 (H) <5 mIU/mL    Comment:          GEST. AGE      CONC.  (mIU/mL)   <=1 WEEK        5 - 50     2 WEEKS       50 - 500     3 WEEKS       100 - 10,000     4 WEEKS     1,000 - 30,000     5 WEEKS     3,500 - 115,000   6-8 WEEKS     12,000 - 270,000    12 WEEKS     15,000 - 220,000        FEMALE AND NON-PREGNANT FEMALE:     LESS THAN 5 mIU/mL Performed at [facility], [place] [address] [place], [place] [address]   Urinalysis, Routine w reflex microscopic Urine, Unspecified Source     Status: Abnormal   Collection Time: 07/13/20  1:20 PM  Result Value Ref Range   Color, Urine AMBER (A) YELLOW    Comment: BIOCHEMICALS MAY BE AFFECTED BY COLOR   APPearance CLOUDY (A) CLEAR   Specific Gravity, Urine 1.028 1.005 - 1.030   pH 5.0 5.0 - 8.0   Glucose, UA NEGATIVE NEGATIVE mg/dL   Hgb urine dipstick SMALL (A) NEGATIVE   Bilirubin Urine NEGATIVE NEGATIVE   Ketones, ur 5 (A) NEGATIVE mg/dL   Protein, ur 100 (A) NEGATIVE mg/dL   Nitrite POSITIVE (A) NEGATIVE   Leukocytes,Ua LARGE (A) NEGATIVE   RBC / HPF 6-10 0 - 5 RBC/hpf   WBC, UA >50 (H) 0 - 5 WBC/hpf   Bacteria, UA MANY (A) NONE SEEN   Squamous Epithelial / LPF 11-20 0 - 5   WBC Clumps PRESENT     Comment: Performed at [facility], [address] [place], [place] [address]    DG Chest 2 View  Result Date: 07/13/2020 CLINICAL DATA:  Shortness of breath EXAM: CHEST - 2 VIEW COMPARISON:  07/26/2018 FINDINGS: Low lung volumes. Bibasilar airspace opacities. Mild cardiomegaly. No effusions. No acute bony abnormality. IMPRESSION: Low lung volumes with bibasilar airspace opacities concerning for pneumonia. Mild cardiomegaly. Electronically Signed   By: Rolm Baptise M.D.   On: 07/13/2020 11:33   [place] MFM OB COMP + 14 WK  Result Date:  07/13/2020 ----------------------------------------------------------------------  OBSTETRICS REPORT                       (Signed Final 07/13/2020 02:48 pm) ---------------------------------------------------------------------- Patient Info  ID #:       [id number]  D.O.B.:  [birth year] (34 yrs)  Name:       Kelly [name]              Visit Date: 07/13/2020 02:08 pm ---------------------------------------------------------------------- Performed By  Attending:        Sander Nephew      Ref. Address:     [facility]                    MD                                                             GYN  Performed By:     Valda Favia          [facility]                    [place]  Referred By:      Annalee Genta DO ---------------------------------------------------------------------- Orders  #  Description                           Code        Ordered By  1  [place] MFM OB COMP + 14 WK                76805.01    Janyth Pupa ----------------------------------------------------------------------  #  Order #                     Accession #                Episode #  1  [id number]                   [id number]                 [id number] ---------------------------------------------------------------------- Indications  [redacted] weeks gestation of pregnancy                Z3A.26  Encounter for uncertain dates                  T62.56  Drug use complicating pregnancy, third         O43.323  trimester  Advanced maternal age multigravida 31+,        O45.523  third trimester  Previous cesarean delivery, antepartum x1      O34.219  Abdominal pain in pregnancy                    O99.89 ---------------------------------------------------------------------- Fetal Evaluation  Num Of Fetuses:         1  Fetal Heart Rate(bpm):  169  Cardiac Activity:       Observed  Presentation:           Breech  Placenta:                Anterior  P. Cord Insertion:      Not well visualized  Amniotic Fluid  AFI FV:      Within normal limits                              Largest Pocket(cm)                              5.6 ---------------------------------------------------------------------- Biometry  BPD:      64.8  mm     G. Age:  26w 1d         41  %    CI:        75.49   %    70 - 86                                                          FL/HC:      18.8   %    18.6 - 20.4  HC:      236.5  mm     G. Age:  25w 5d         14  %    HC/AC:      1.01        1.04 - 1.22  AC:      234.2  mm     G. Age:  27w 5d         86  %    FL/BPD:     68.7   %    71 - 87  FL:       44.5  mm     G. Age:  24w 5d          5  %    FL/AC:      19.0   %    20 - 24  HUM:      42.2  mm     G. Age:  25w 3d         27  %  Est. FW:     930  gm      2 lb 1 oz     49  % ---------------------------------------------------------------------- OB History  Gravidity:    7         Term:   3        Prem:   0        SAB:   3  TOP:          0       Ectopic:  0        Living: 3 ---------------------------------------------------------------------- Gestational Age  U/S Today:     26w 1d                                        EDD:   10/18/20  Best:          26w 1d     Det. By:  U/S (07/13/20)           EDD:   10/18/20 ---------------------------------------------------------------------- Anatomy  Cranium:               Appears  normal         LVOT:                   Not well visualized  Cavum:                 Appears normal         Aortic Arch:            Not well visualized  Ventricles:            Not well visualized    Ductal Arch:            Not well visualized  Choroid Plexus:        Appears normal         Diaphragm:              Appears normal  Cerebellum:            Not well visualized    Stomach:                Appears normal, left                                                                        sided  Posterior Fossa:       Not well visualized    Abdomen:                 Appears normal  Nuchal Fold:           Not applicable (>37    Abdominal Wall:         Not well visualized                         wks GA)  Face:                  Not well visualized    Cord Vessels:           Appears normal (3                                                                        vessel cord)  Lips:                  Not well visualized    Kidneys:                Not well visualized  Palate:                Not well visualized    Bladder:                Appears normal  Thoracic:              Appears normal         Spine:                  Not well visualized  Heart:  Not well visualized    Upper Extremities:      Visualized  RVOT:                  Not well visualized    Lower Extremities:      Visualized  Other:  Fetus appears to be a female. Technically difficult due to maternal          habitus and fetal position. ---------------------------------------------------------------------- Cervix Uterus Adnexa  Cervix  Not visualized (advanced GA >24wks)  Uterus  No abnormality visualized.  Right Ovary  No adnexal mass visualized.  Left Ovary  No adnexal mass visualized.  Cul De Sac  No free fluid seen.  Adnexa  No abnormality visualized. ----------------------------------------------------------------------   Impression  Single intrauterine pregnancy here for a detailed anatomy  due new onset abdominal pain and known substance abuse (  Heroine).  Normal anatomy with measurements consistent with today's  examination, Ms. Magnan has an uncertain LMP.  There is good fetal movement and amniotic fluid volume  Suboptimal views of the fetal anatomy were obtained  secondary to fetal position and maternal ability to lay flat.    Recommendations  Repeat growth and anatomy in 3 weeks. ----------------------------------------------------------------------               Sander Nephew, MD Electronically Signed Final Report   07/13/2020 02:48 pm  ----------------------------------------------------------------------

## 2020-07-13 NOTE — ED Notes (Signed)
Two members of the IV at beside.

## 2020-07-13 NOTE — Progress Notes (Signed)
Pharmacy Antibiotic Note  Kelly [name] is a 35 y.o. 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  Pharmacy has been consulted for vancomycin and cefepime dosing. Patient is pregnant with EGA of [redacted] weeks.  Plan: Vancomycin 1500 mg IV x1, followed by 1g IV every 8 hours. Goal AUC 400-550. Estimated AUC 487. Cefepime 2g IV every 8 hours Monitor C&S, clinical status, renal function, VT as needed  Temp (24hrs), Avg:98.4 F (36.9 C), Min:97.6 F (36.4 C), Max:99.3 F (37.4 C)  Recent Labs  Lab 07/13/20 1035 07/13/20 1219 07/13/20 1530  WBC 19.1*  --   --   CREATININE 0.74  --   --   LATICACIDVEN  --  3.2* 2.0*    CrCl cannot be calculated (Unknown ideal weight.).    Allergies  Allergen Reactions  . Hydrocodone Itching  . Morphine And Related Nausea And Vomiting    Antimicrobials this admission: Azith 3/2 x1 CTX 3/2 x1 Vancomycin 3/2> Cefepime 3/2>  Thank you for allowing pharmacy to be a part of this patient's care.  Romilda Garret, PharmD PGY1 Acute Care Pharmacy Resident 07/13/2020 5:05 PM  Please check [email or web address] for unit specific pharmacy phone numbers.

## 2020-07-13 NOTE — ED Notes (Signed)
EDP attempted [place] IV. Phlebotomy is attempting labs at this time.

## 2020-07-13 NOTE — Progress Notes (Signed)
G5P4 at unknown gestation.  No PNC.  Had last baby in 6/20. First baby was C/S for fetal distress and high BP;s.  Hx of fentanyl and heroin use.  Used this morning at 1am.  VS BP114/69 P110 r35 T97.6oral  Reports to [facility] with difficulty breathing and abdominal pain.  Abdomen palpates tender on URQ. Difficult to trace FHT's.  Audible 170's. Sacral and abdominal swelling noted.  Feet 2+ pitting edema.

## 2020-07-13 NOTE — Progress Notes (Signed)
eLink Physician-Brief Progress Note Patient Name: Kelly [name] DOB: 23-Nov-1985 MRN: [id number]   Date of Service  07/13/2020  HPI/Events of Note  Patient c/o SOB. [place] O2 increased from 2 to 4 L/min [place]. Sat on 4 L/min  = 97% and RR = 42. Last K+ = 3.2. Bedside nurse states that the patient "sounds wet". QTc interval = 0.613 seconds.   eICU Interventions  Plan: 1. D/C LR IV infusion. 2. Replace K+. 3. Lasix 40 mg IV X 1 now.  4. D/C Phenergan. 5. Compazine 10 mg IV Q 6 hours PRN N/V. 6. Will request that ground team evaluate the patient at bedside.      Intervention Category Major Interventions: Hypoxemia - evaluation and management  Safwan Tomei Eugene 07/13/2020, 8:02 PM

## 2020-07-13 NOTE — ED Notes (Signed)
Pt returned from xray

## 2020-07-13 NOTE — Consult Note (Signed)
Consult Note   Kelly [name] YHC:[id number] DOB: [birth year] DOA: 07/13/2020  PCP: [facility] Consultants:  [place] - cardiology Patient coming from:  Home - lives alone; NOK: Mother  Chief Complaint: Abdominal pain  HPI: Kelly [name] is a 35 y.o. female with medical history significant of polysubstance abuse including heroin/fentanyl; bipolar; and endocarditis;epidural abscess presenting with abdominal pain in the setting of G5P4 pregnancy (no PNC, estimated gestational age 2+1) and IVDA.  She reports that she has had LE edema.  She started with RUQ pain 2 days ago, now on both sides of her abdomen and it hurts to breathe.  Can't walk or talk.  She "knows the infection is back", likely endocarditis.  She knows she is pregnant.  She doesn't have periods.  She started using using fentanyl and heroin again about 5 months ago.  She can't take Suboxone.  She wants to start methadone here in the hospital and then resume outpatient care again at [name].  She was last seen by Dr. Sung Amabile in Jan 2021 for cardiac cath s/p admission in 11/2017 with bacterial endocarditis, severe TR, LV systolic dysfunction, and epidural abscess; and admission in 06/2018 with sepsis due to PNA and recurrent endocarditis.  At the time of her visit in 05/2019, she reported being clean since 05/2018, taking methadone.  She was subsequently seen by Dr. Servando Snare in 06/2019 for consideration of replacement of her TV.  She was recommended to treat her dental issues and f/u in 6 weeks and has been lost to f/u since.   ED Course:  H/o complications from IVDA, needs tricuspid repair while sober and on methadone.  Now using again.  Here is hypoxia, RUQ pain, leukocytosis.  Looked fluid overloaded, clearly pregnant.  OB did informal [place], no contractions and normal FHT.  Consult by Dr. Nelda Marseille.  CXR indicates PNA, also with UTI.  Admitting for sepsis.  Review of Systems: As per HPI; otherwise review of systems  reviewed and negative.   Ambulatory Status:  Ambulates without assistance  COVID Vaccine Status:  None  Past Medical History:  Diagnosis Date  . 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 Abscess of breast    rt breast  . ADD (attention deficit disorder)   . Back pain   . Bipolar 1 disorder ([place])   . Endocarditis 2020  . Hypertension   . Leukocytosis   . Polysubstance abuse ([place])   . Pyelonephritis affecting pregnancy 05/24/2016  . Sepsis ([place]) 12/02/2017  . Suicidal overdose [facility])     Past Surgical History:  Procedure Laterality Date  . BACK SURGERY     scoliosis-in care everywhere 08/1999  . CESAREAN SECTION    . CHOLECYSTECTOMY  March 2015  . FOREIGN BODY REMOVAL Right 05/28/2016   Procedure: REMOVAL FOREIGN BODY RIGHT ARM;  Surgeon: Meredith Pel, MD;  Location: [place];  Service: Orthopedics;  Laterality: Right;  . IRRIGATION AND DEBRIDEMENT ABSCESS Right 07/13/2014   Procedure: IRRIGATION AND DEBRIDEMEN TRIGHT BREAST ABSCESS;  Surgeon: Donnie Mesa, MD;  Location: [place];  Service: General;  Laterality: Right;  . LUMBAR LAMINECTOMY/DECOMPRESSION MICRODISCECTOMY N/A 12/05/2017   Procedure: LUMBAR LAMINECTOMY FOR EPIDURAL ABSCESS;  Surgeon: Newman Pies, MD;  Location: [place];  Service: Neurosurgery;  Laterality: N/A;  . MULTIPLE EXTRACTIONS WITH ALVEOLOPLASTY Bilateral 07/03/2018   Procedure: Extraction of tooth #'s 30 and 31 with alveoloplasty;  Surgeon: Lenn Cal, DDS;  Location: [place];  Service: Oral Surgery;  Laterality: Bilateral;  . RADIOLOGY WITH ANESTHESIA N/A 12/05/2017   Procedure: MRI WITH ANESTHESIA OF [facility] AND LUMBAR SPINE WITH AND WITHOUT;  Surgeon: Radiologist, Medication, MD;  Location: [place];  Service: Radiology;  Laterality: N/A;  . RIGHT/LEFT HEART CATH AND CORONARY ANGIOGRAPHY N/A 05/21/2019   Procedure: RIGHT/LEFT HEART CATH AND CORONARY ANGIOGRAPHY;  Surgeon: Jolaine Artist, MD;  Location: [place] CV [facility];  Service:  Cardiovascular;  Laterality: N/A;  . TEE WITHOUT CARDIOVERSION N/A 05/21/2019   Procedure: TRANSESOPHAGEAL ECHOCARDIOGRAM (TEE);  Surgeon: Jolaine Artist, MD;  Location[name] ENDOSCOPY;  Service: Cardiovascular;  Laterality: N/A;    Social History   Socioeconomic History  . Marital status: Single    Spouse name: Not on file  . Number of children: Not on file  . Years of education: Not on file  . Highest education level: Not on file  Occupational History  . Occupation: unemployed  Tobacco Use  . Smoking status: Former Smoker    Packs/day: 1.00    Years: 13.00    Pack years: 13.00    Types: Cigarettes    Quit date: 06/13/2018    Years since quitting: 2.0  . Smokeless tobacco: Never Used  Vaping Use  . Vaping Use: Never used  Substance and Sexual Activity  . Alcohol use: Not Currently    Comment: one or two drinks weekly  . Drug use: Yes    Types: Cocaine, Heroin, IV, Fentanyl    Comment: heroin, crack cocaine, fentanyl last used today (07/13/20)   . Sexual activity: Yes    Birth control/protection: Condom  Other Topics Concern  . Not on file  Social History Narrative  . Not on file   Social Determinants of Health   Financial Resource Strain: Not on file  Food Insecurity: Not on file  Transportation Needs: Not on file  Physical Activity: Not on file  Stress: Not on file  Social Connections: Not on file  Intimate Partner Violence: Not on file    Allergies  Allergen Reactions  . Hydrocodone Itching  . Morphine And Related Nausea And Vomiting    Family History  Problem Relation Age of Onset  . Diabetes Mother   . Diabetes Father   . Hypertension Father   . Heart attack Neg Hx   . Hyperlipidemia Neg Hx   . Sudden death Neg Hx     Prior to Admission medications   Not on File    Physical Exam: Vitals:   07/13/20 1345 07/13/20 1415 07/13/20 1512 07/13/20 1552  BP: 120/70 125/70  128/72  Pulse: (!) 111 (!) 109  (!) 117  Resp: 17 (!) 24  (!) 35  Temp:    99.3 F (37.4 C)   TempSrc:   Oral   SpO2: 98% 99%  100%     . General:  Appears chronically ill and miserable, sitting up in tripod position . Eyes:   EOMI, normal lids, iris . ENT:  grossly normal hearing, lips & tongue, mmm; poor dentition . Neck:  no LAD, masses or thyromegaly . Cardiovascular:  RR with tachycardia, +4/6 murmur with ?rub. 2-3+ LE edema.  Marland Kitchen Respiratory:   Diffuse left-sided rhonchi, primarily LL with ?radiation into UL.  Mildly increased respiratory effort.  No longer hypoxic while on [place] O2.  Tripod sitting at bedside . Abdomen:  Gravid, hard to examine due to patient positioning and unwillingness . Back:   normal alignment, no CVAT . Skin:  Diffuse injection sites, primarily on her B  legs . Musculoskeletal:  grossly normal tone BUE/BLE, good ROM, no bony abnormality . Psychiatric:  Agitated/uncomfortable mood and affect, speech fluent and appropriate, AOx3 . Neurologic:  CN 2-12 grossly intact, moves all extremities in coordinated fashion    Radiological Exams on Admission: Independently reviewed - see discussion in A/P where applicable  DG Chest 2 View  Result Date: 07/13/2020 CLINICAL DATA:  Shortness of breath EXAM: CHEST - 2 VIEW COMPARISON:  07/26/2018 FINDINGS: Low lung volumes. Bibasilar airspace opacities. Mild cardiomegaly. No effusions. No acute bony abnormality. IMPRESSION: Low lung volumes with bibasilar airspace opacities concerning for pneumonia. Mild cardiomegaly. Electronically Signed   By: Rolm Baptise M.D.   On: 07/13/2020 11:33   [place] MFM OB COMP + 14 WK  Result Date: 07/13/2020 ----------------------------------------------------------------------  OBSTETRICS REPORT                       (Signed Final 07/13/2020 02:48 pm) ---------------------------------------------------------------------- Patient Info  ID #:       [id number]                          D.O.B.:  Jul 01, 1985 (34 yrs)  Name:       Kelly [name]              Visit Date: 07/13/2020  02:08 pm ---------------------------------------------------------------------- Performed By  Attending:        Sander Nephew      Ref. Address:     [facility]                    MD                                                             GYN  Performed By:     Valda Favia          [facility]                    [place]  Referred By:      Annalee Genta DO ---------------------------------------------------------------------- Orders  #  Description                           Code        Ordered By  1  [place] MFM OB COMP + 14 WK                86761.95    Janyth Pupa ----------------------------------------------------------------------  #  Order #                     Accession #                Episode #  1  [id number]  [id number]                 [id number] ---------------------------------------------------------------------- Indications  [redacted] weeks gestation of pregnancy                Z3A.26  Encounter for uncertain dates                  X38.18  Drug use complicating pregnancy, third         O62.323  trimester  Advanced maternal age multigravida 38+,        O35.523  third trimester  Previous cesarean delivery, antepartum x1      O34.219  Abdominal pain in pregnancy                    O99.89 ---------------------------------------------------------------------- Fetal Evaluation  Num Of Fetuses:         1  Fetal Heart Rate(bpm):  169  Cardiac Activity:       Observed  Presentation:           Breech  Placenta:               Anterior  P. Cord Insertion:      Not well visualized  Amniotic Fluid  AFI FV:      Within normal limits                              Largest Pocket(cm)                              5.6 ---------------------------------------------------------------------- Biometry  BPD:      64.8  mm     G. Age:  26w 1d         41  %    CI:        75.49   %    70 - 86                                                           FL/HC:      18.8   %    18.6 - 20.4  HC:      236.5  mm     G. Age:  25w 5d         14  %    HC/AC:      1.01        1.04 - 1.22  AC:      234.2  mm     G. Age:  27w 5d         86  %    FL/BPD:     68.7   %    71 - 87  FL:       44.5  mm     G. Age:  24w 5d          5  %    FL/AC:      19.0   %    20 - 24  HUM:      42.2  mm     G. Age:  25w 3d         27  %  Est. FW:     930  gm      2 lb 1 oz     49  % ---------------------------------------------------------------------- OB History  Gravidity:    7         Term:   3        Prem:   0        SAB:   3  TOP:          0       Ectopic:  0        Living: 3 ---------------------------------------------------------------------- Gestational Age  U/S Today:     26w 1d                                        EDD:   10/18/20  Best:          26w 1d     Det. By:  U/S (07/13/20)           EDD:   10/18/20 ---------------------------------------------------------------------- Anatomy  Cranium:               Appears normal         LVOT:                   Not well visualized  Cavum:                 Appears normal         Aortic Arch:            Not well visualized  Ventricles:            Not well visualized    Ductal Arch:            Not well visualized  Choroid Plexus:        Appears normal         Diaphragm:              Appears normal  Cerebellum:            Not well visualized    Stomach:                Appears normal, left                                                                        sided  Posterior Fossa:       Not well visualized    Abdomen:                Appears normal  Nuchal Fold:           Not applicable (>29    Abdominal Wall:         Not well visualized                         wks GA)  Face:                  Not well visualized    Cord Vessels:           Appears normal (3  vessel cord)  Lips:                  Not well visualized    Kidneys:                Not well  visualized  Palate:                Not well visualized    Bladder:                Appears normal  Thoracic:              Appears normal         Spine:                  Not well visualized  Heart:                 Not well visualized    Upper Extremities:      Visualized  RVOT:                  Not well visualized    Lower Extremities:      Visualized  Other:  Fetus appears to be a female. Technically difficult due to maternal          habitus and fetal position. ---------------------------------------------------------------------- Cervix Uterus Adnexa  Cervix  Not visualized (advanced GA >24wks)  Uterus  No abnormality visualized.  Right Ovary  No adnexal mass visualized.  Left Ovary  No adnexal mass visualized.  Cul De Sac  No free fluid seen.  Adnexa  No abnormality visualized. ---------------------------------------------------------------------- Impression  Single intrauterine pregnancy here for a detailed anatomy  due new onset abdominal pain and known substance abuse (  Heroine).  Normal anatomy with measurements consistent with today's  examination, Ms. Defino has an uncertain LMP.  There is good fetal movement and amniotic fluid volume  Suboptimal views of the fetal anatomy were obtained  secondary to fetal position and maternal ability to lay flat. ---------------------------------------------------------------------- Recommendations  Repeat growth and anatomy in 3 weeks. ----------------------------------------------------------------------               Sander Nephew, MD Electronically Signed Final Report   07/13/2020 02:48 pm ----------------------------------------------------------------------   EKG: Independently reviewed.  Sinus tachycardia with rate 114; prolonged QTc 636; incomplete RBBB; nonspecific ST changes with no evidence of acute ischemia   Labs on Admission: I have personally reviewed the available labs and imaging studies at the time of the admission.  Pertinent labs:   Na++  129 K+ 3.2 Glucose 138 Albumin 2.3 BNP 157.1 CK 68 HS troponin 14 WBC 19.1 Hgb 9.4 COVID/flu negative Lactate 3.2 UA: small Hgb, 5 ketones, large LE, nitrite +, 100 protein, many bacteria, >50 WBC   Assessment/Plan Principal Problem:   Sepsis (HCC) Active Problems:   Opioid use disorder, severe, dependence (HCC)   No prenatal care in current pregnancy in third trimester   History of bacterial endocarditis   Tricuspid regurgitation   Dental caries   Acute CHF (congestive heart failure) (HCC)   Normal IUP (intrauterine pregnancy) on prenatal ultrasound, third trimester   Sepsis - due to PNA and/or endocarditis and/or UTI -Patient presenting with abdominal pain - BUT with h/o endocarditis, ongoing use of IV heroin, fentanyl and current pregnancy with no PNC -SIRS criteria in this patient includes: Leukocytosis, tachycardia, tachypnea, hypoxia  -Patient has evidence of acute organ failure with elevated lactate >2 that is not easily explained by another condition. -While  awaiting blood cultures, this appears to be a preseptic condition. -Sepsis protocol initiated -CXR is c/w bibasilar opacities concerning for PNA; she clearly has LLL rhonchi concerning for PNA clinically.  Given her IVDA she is also at risk for septic pulmonary emboli but CT is not ideal given her pregnancy -She also appears likely to have recurrent endocarditis -Her urine is also grossly infected -Blood and urine cultures pending -ID consult has been requested -Given the severity of her multiple acute medical problems in conjunction with her active pregnancy, the most appropriate placement for this patient appears to be the ICU and so I have consulted PCCM -OB will be co-managing this patient  Probable acute heart failure with endocarditis, severe TR -Most recent interaction with Dr. Haroldine Laws was at the time of cath in 05/2019 -At that time, she had EF 50% and severe TR with low cardiac output despite well  compensated filling pressures -She was seen by Dr. Servando Snare for consideration of TV repair vs. Replacement but her dentition precluded this  -She has since resumed IVDA -Recurrent endocarditis seems likely, even probable -Acute CHF also appears likely given her chest discomfort, LE edema, and h/o the same -As noted above, may benefit from CTA but this is significant exposure to the baby so will defer for now -Will request STAT limited echo (offered single dose of fentanyl to accomplish this study for now) -Cardiology consult requested for today -PCCM consult, as above  Polysubstance abuse -Patient is opioid (heroin, fentanyl) dependent -She is continuing to use, last use this AM -She is requesting initiation of methadone as inpatient with plan for outpatient f/u -OB/GYN appears to be ok with methadone if that is the desired MAT  -Methadone cannot be prescribed at the time of d/c -Will defer to PCCM  IUP, no prenatal care -No apparent [place] -Bedside US today indicates EGA of [redacted] weeks -OB is following    Note: This patient has been tested and is negative for the novel coronavirus COVID-19. She has NOT been vaccinated against COVID-19.    *Thank you for this interesting consult.  This patient is critically ill and so will be admitted by PCCM at this time.  TRH will sign off and would be happy to resume care upon d/c from the ICU.   Total critical care time: 65 minutes Critical care time was exclusive of separately billable procedures and treating other patients. Critical care was necessary to treat or prevent imminent or life-threatening deterioration. Critical care was time spent personally by me on the following activities: development of treatment plan with patient and/or surrogate as well as nursing, discussions with consultants, evaluation of patient's response to treatment, examination of patient, obtaining history from patient or surrogate, ordering and performing treatments and  interventions, ordering and review of laboratory studies, ordering and review of radiographic studies, pulse oximetry and re-evaluation of patient's condition.   Karmen Bongo MD [facility]   How to contact the [facility] Attending or Consulting provider [place] or covering provider during after hours [name], for this patient?  1. Check the care team in [facility] and look for a) attending/consulting [facility] provider listed and b) the [name] team listed 2. Log into [email or web address] and use [place]'s universal password to access. If you do not have the password, please contact the hospital operator. 3. Locate the [name] provider you are looking for under [facility] and page to a number that you can be directly reached. 4. If you still have difficulty reaching the  provider, please page the [facility] (Director on Call) for the Hospitalists listed on amion for assistance.   07/13/2020, 5:08 PM

## 2020-07-13 NOTE — ED Notes (Signed)
Request NS to follow up with IV as this pt needs a midline or PICC.

## 2020-07-13 NOTE — ED Notes (Signed)
Temp reported to EDP

## 2020-07-13 NOTE — ED Notes (Signed)
Requested pain med from admitting to facilitate echocardiogram.

## 2020-07-13 NOTE — ED Provider Notes (Signed)
[facility] EMERGENCY DEPARTMENT Provider Note  CSN: [id number] Arrival date & time: 07/13/20 1028    History Chief Complaint  Patient presents with  . Abdominal Pain    HPI  Kelly [name] is a 35 y.o. 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 She was last evaluated by CT Surgery in Mar 2021 who at the time did not recommend valve surgery due to lack of symptoms. In the meantime, she has relapsed and begun using IV fentanyl/heroin again and has been lost to follow up since. She has had several weeks of progressive LE edema, SOB, DOE and subjective fevers at home. In the last two days has begun to have moderate to severe aching RUQ pain and feels like her abdomen is swollen. She does not have a history of hepatitis last she was checked. She has been taking tylenol for pain at home.    Past Medical History:  Diagnosis Date  . 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 Abscess of breast    rt breast  . ADD (attention deficit disorder)   . Back pain   . Bipolar 1 disorder ([place])   . Hypertension   . Leukocytosis   . Polysubstance abuse ([place])   . Pyelonephritis affecting pregnancy 05/24/2016  . Sepsis ([place]) 12/02/2017  . Suicidal overdose [facility])     Past Surgical History:  Procedure Laterality Date  . BACK SURGERY     scoliosis-in care everywhere 08/1999  . CESAREAN SECTION    . CHOLECYSTECTOMY  March 2015  . FOREIGN BODY REMOVAL Right 05/28/2016   Procedure: REMOVAL FOREIGN BODY RIGHT ARM;  Surgeon: Meredith Pel, MD;  Location: [facility];  Service: Orthopedics;  Laterality: Right;  . IRRIGATION AND DEBRIDEMENT ABSCESS Right 07/13/2014   Procedure: IRRIGATION AND DEBRIDEMEN TRIGHT BREAST ABSCESS;  Surgeon: Donnie Mesa, MD;  Location: [facility];  Service: General;  Laterality: Right;  . LUMBAR LAMINECTOMY/DECOMPRESSION MICRODISCECTOMY N/A 12/05/2017   Procedure: LUMBAR LAMINECTOMY FOR  EPIDURAL ABSCESS;  Surgeon: Newman Pies, MD;  Location: [facility];  Service: Neurosurgery;  Laterality: N/A;  . MULTIPLE EXTRACTIONS WITH ALVEOLOPLASTY Bilateral 07/03/2018   Procedure: Extraction of tooth #'s 30 and 31 with alveoloplasty;  Surgeon: Lenn Cal, DDS;  Location: [place];  Service: Oral Surgery;  Laterality: Bilateral;  . RADIOLOGY WITH ANESTHESIA N/A 12/05/2017   Procedure: MRI WITH ANESTHESIA OF [facility] AND LUMBAR SPINE WITH AND WITHOUT;  Surgeon: Radiologist, Medication, MD;  Location: [facility];  Service: Radiology;  Laterality: N/A;  . RIGHT/LEFT HEART CATH AND CORONARY ANGIOGRAPHY N/A 05/21/2019   Procedure: RIGHT/LEFT HEART CATH AND CORONARY ANGIOGRAPHY;  Surgeon: Jolaine Artist, MD;  Location: [place] CV LAB;  Service: Cardiovascular;  Laterality: N/A;  . TEE WITHOUT CARDIOVERSION N/A 05/21/2019   Procedure: TRANSESOPHAGEAL ECHOCARDIOGRAM (TEE);  Surgeon: Jolaine Artist, MD;  Location: [name] ENDOSCOPY;  Service: Cardiovascular;  Laterality: N/A;    Family History  Problem Relation Age of Onset  . Diabetes Mother   . Diabetes Father   . Hypertension Father   . Heart attack Neg Hx   . Hyperlipidemia Neg Hx   . Sudden death Neg Hx     Social History   Tobacco Use  . Smoking status: Former Smoker    Packs/day: 1.00    Years: 13.00    Pack years: 13.00    Types: Cigarettes    Quit date: 06/13/2018  Years since quitting: 2.0  . Smokeless tobacco: Never Used  Vaping Use  . Vaping Use: Never used  Substance Use Topics  . Alcohol use: Not Currently    Comment: one or two drinks weekly  . Drug use: Yes    Types: Cocaine, Heroin, IV    Comment: heroin, crack cocaine last used 06/13/2018     Home Medications Prior to Admission medications   Not on File     Allergies    Hydrocodone and Morphine and related   Review of Systems   Review of Systems A comprehensive review of systems was completed and negative except as noted in HPI.    Physical  Exam BP 125/70   Pulse (!) 109   Temp 99.3 F (37.4 C) (Oral)   Resp (!) 24   SpO2 99%   Physical Exam Vitals and nursing note reviewed.  Constitutional:      Appearance: Normal appearance.  HENT:     Head: Normocephalic and atraumatic.     Nose: Nose normal.     Mouth/Throat:     Mouth: Mucous membranes are moist.     Comments: Poor dentition Eyes:     Extraocular Movements: Extraocular movements intact.     Conjunctiva/sclera: Conjunctivae normal.  Cardiovascular:     Rate and Rhythm: Tachycardia present.     Comments: 4/6 murmur Pulmonary:     Comments: Tachypea, decreased breath sounds in bases Abdominal:     General: Abdomen is flat.     Palpations: Abdomen is soft.     Tenderness: There is abdominal tenderness (RUQ).  Musculoskeletal:        General: No swelling. Normal range of motion.     Cervical back: Neck supple.     Right lower leg: Edema present.     Left lower leg: Edema present.  Skin:    General: Skin is warm and dry.  Neurological:     General: No focal deficit present.     Mental Status: She is alert.  Psychiatric:        Mood and Affect: Mood normal.      ED Results / Procedures / Treatments   Labs (all labs ordered are listed, but only abnormal results are displayed) Labs Reviewed  COMPREHENSIVE METABOLIC PANEL - Abnormal; Notable for the following components:      Result Value   Sodium 129 (*)    Potassium 3.2 (*)    Chloride 94 (*)    CO2 21 (*)    Glucose, Bld 138 (*)    Calcium 8.6 (*)    Albumin 2.3 (*)    Alkaline Phosphatase 138 (*)    All other components within normal limits  CBC - Abnormal; Notable for the following components:   WBC 19.1 (*)    RBC 3.04 (*)    Hemoglobin 9.4 (*)    HCT 28.5 (*)    All other components within normal limits  URINALYSIS, ROUTINE W REFLEX MICROSCOPIC - Abnormal; Notable for the following components:   Color, Urine AMBER (*)    APPearance CLOUDY (*)    Hgb urine dipstick SMALL (*)     Ketones, ur 5 (*)    Protein, ur 100 (*)    Nitrite POSITIVE (*)    Leukocytes,Ua LARGE (*)    WBC, UA >50 (*)    Bacteria, UA MANY (*)    All other components within normal limits  LACTIC ACID, PLASMA - Abnormal; Notable for the following components:  Lactic Acid, Venous 3.2 (*)    All other components within normal limits  BRAIN NATRIURETIC PEPTIDE - Abnormal; Notable for the following components:   B Natriuretic Peptide 157.1 (*)    All other components within normal limits  HCG, QUANTITATIVE, PREGNANCY - Abnormal; Notable for the following components:   hCG, Beta Chain, Quant, S 12,898 (*)    All other components within normal limits  I-STAT BETA HCG BLOOD, ED (MC, WL, AP ONLY) - Abnormal; Notable for the following components:   I-stat hCG, quantitative >2,000.0 (*)    All other components within normal limits  RESP PANEL BY RT-PCR (FLU A&B, COVID) ARPGX2  CULTURE, BLOOD (ROUTINE X 2)  CULTURE, BLOOD (ROUTINE X 2)  LIPASE, BLOOD  CK  RAPID URINE DRUG SCREEN, HOSP PERFORMED  LACTIC ACID, PLASMA  TROPONIN I (HIGH SENSITIVITY)  TROPONIN I (HIGH SENSITIVITY)    EKG EKG Interpretation  Date/Time:  Wednesday July 13 2020 10:40:43 EST Ventricular Rate:  114 PR Interval:    QRS Duration: 98 QT Interval:  462 QTC Calculation: 636 R Axis:   83 Text Interpretation:  Critical Test Result: Long QTc Sinus tachycardia Incomplete right bundle branch block Nonspecific ST abnormality Prolonged QT Abnormal ECG No significant change since last tracing EARLIER SAME DATE Confirmed by Calvert Cantor [phone]) on 07/13/2020 10:53:37 AM   Radiology DG Chest 2 View  Result Date: 07/13/2020 CLINICAL DATA:  Shortness of breath EXAM: CHEST - 2 VIEW COMPARISON:  07/26/2018 FINDINGS: Low lung volumes. Bibasilar airspace opacities. Mild cardiomegaly. No effusions. No acute bony abnormality. IMPRESSION: Low lung volumes with bibasilar airspace opacities concerning for pneumonia. Mild cardiomegaly.  Electronically Signed   By: Rolm Baptise M.D.   On: 07/13/2020 11:33   [place] MFM OB COMP + 14 WK  Result Date: 07/13/2020 ----------------------------------------------------------------------  OBSTETRICS REPORT                       (Signed Final 07/13/2020 02:48 pm) ---------------------------------------------------------------------- Patient Info  ID #:       [id number]                          D.O.B.:  16-Jan-1986 (34 yrs)  Name:       Kelly [name]              Visit Date: 07/13/2020 02:08 pm ---------------------------------------------------------------------- Performed By  Attending:        Sander Nephew      Ref. Address:     [facility]                    MD                                                             GYN  Performed By:     Valda Favia          [facility]  [employer]  Referred By:      Annalee Genta DO ---------------------------------------------------------------------- Orders  #  Description                           Code        Ordered By  1  [place] MFM OB COMP + 14 WK                76805.01    Janyth Pupa ----------------------------------------------------------------------  #  Order #                     Accession #                Episode #  1  [id number]                   [id number]                 [id number] ---------------------------------------------------------------------- Indications  [redacted] weeks gestation of pregnancy                Z3A.26  Encounter for uncertain dates                  X32.44  Drug use complicating pregnancy, third         O32.323  trimester  Advanced maternal age multigravida 4+,        O76.523  third trimester  Previous cesarean delivery, antepartum x1      O34.219  Abdominal pain in pregnancy                    O99.89 ---------------------------------------------------------------------- Fetal Evaluation  Num Of Fetuses:         1  Fetal Heart  Rate(bpm):  169  Cardiac Activity:       Observed  Presentation:           Breech  Placenta:               Anterior  P. Cord Insertion:      Not well visualized  Amniotic Fluid  AFI FV:      Within normal limits                              Largest Pocket(cm)                              5.6 ---------------------------------------------------------------------- Biometry  BPD:      64.8  mm     G. Age:  26w 1d         41  %    CI:        75.49   %    70 - 86                                                          FL/HC:      18.8   %    18.6 - 20.4  HC:      236.5  mm     G.  Age:  25w 5d         14  %    HC/AC:      1.01        1.04 - 1.22  AC:      234.2  mm     G. Age:  27w 5d         86  %    FL/BPD:     68.7   %    71 - 87  FL:       44.5  mm     G. Age:  24w 5d          5  %    FL/AC:      19.0   %    20 - 24  HUM:      42.2  mm     G. Age:  25w 3d         27  %  Est. FW:     930  gm      2 lb 1 oz     49  % ---------------------------------------------------------------------- OB History  Gravidity:    7         Term:   3        Prem:   0        SAB:   3  TOP:          0       Ectopic:  0        Living: 3 ---------------------------------------------------------------------- Gestational Age  U/S Today:     26w 1d                                        EDD:   10/18/20  Best:          26w 1d     Det. By:  U/S (07/13/20)           EDD:   10/18/20 ---------------------------------------------------------------------- Anatomy  Cranium:               Appears normal         LVOT:                   Not well visualized  Cavum:                 Appears normal         Aortic Arch:            Not well visualized  Ventricles:            Not well visualized    Ductal Arch:            Not well visualized  Choroid Plexus:        Appears normal         Diaphragm:              Appears normal  Cerebellum:            Not well visualized    Stomach:                Appears normal, left  sided  Posterior Fossa:       Not well visualized    Abdomen:                Appears normal  Nuchal Fold:           Not applicable (>16    Abdominal Wall:         Not well visualized                         wks GA)  Face:                  Not well visualized    Cord Vessels:           Appears normal (3                                                                        vessel cord)  Lips:                  Not well visualized    Kidneys:                Not well visualized  Palate:                Not well visualized    Bladder:                Appears normal  Thoracic:              Appears normal         Spine:                  Not well visualized  Heart:                 Not well visualized    Upper Extremities:      Visualized  RVOT:                  Not well visualized    Lower Extremities:      Visualized  Other:  Fetus appears to be a female. Technically difficult due to maternal          habitus and fetal position. ---------------------------------------------------------------------- Cervix Uterus Adnexa  Cervix  Not visualized (advanced GA >24wks)  Uterus  No abnormality visualized.  Right Ovary  No adnexal mass visualized.  Left Ovary  No adnexal mass visualized.  Cul De Sac  No free fluid seen.  Adnexa  No abnormality visualized. ---------------------------------------------------------------------- Impression  Single intrauterine pregnancy here for a detailed anatomy  due new onset abdominal pain and known substance abuse (  Heroine).  Normal anatomy with measurements consistent with today's  examination, Ms. Furniss has an uncertain LMP.  There is good fetal movement and amniotic fluid volume  Suboptimal views of the fetal anatomy were obtained  secondary to fetal position and maternal ability to lay flat. ---------------------------------------------------------------------- Recommendations  Repeat growth and anatomy in 3 weeks.  ----------------------------------------------------------------------               Sander Nephew, MD Electronically Signed Final Report   07/13/2020 02:48 pm ----------------------------------------------------------------------   Procedures .Critical Care Performed by: Truddie Hidden, MD Authorized by: Karle Starch,  Mercie Eon, MD   Critical care provider statement:    Critical care time (minutes):  45   Critical care time was exclusive of:  Separately billable procedures and treating other patients   Critical care was necessary to treat or prevent imminent or life-threatening deterioration of the following conditions:  Sepsis and respiratory failure   Critical care was time spent personally by me on the following activities:  Discussions with consultants, evaluation of patient's response to treatment, examination of patient, ordering and performing treatments and interventions, ordering and review of laboratory studies, ordering and review of radiographic studies, pulse oximetry, re-evaluation of patient's condition, obtaining history from patient or surrogate and review of old charts   Care discussed with: admitting provider      Medications Ordered in the ED Medications  cefTRIAXone (ROCEPHIN) 1 g in sodium chloride 0.9 % 100 mL IVPB (1 g Intravenous New Bag/Given 07/13/20 1508)  azithromycin (ZITHROMAX) 500 mg in sodium chloride 0.9 % 250 mL IVPB (has no administration in time range)  sodium chloride flush (NS) 0.9 % injection 10-40 mL (has no administration in time range)  fentaNYL (SUBLIMAZE) injection 50 mcg (50 mcg Intravenous Given 07/13/20 1454)  ondansetron (ZOFRAN) injection 4 mg (4 mg Intravenous Given 07/13/20 1454)  sodium chloride 0.9 % bolus 500 mL (500 mLs Intravenous New Bag/Given 07/13/20 1510)     MDM Rules/Calculators/A&P MDM Patient with known TR here with signs of worsening CHF with LE edema, SOB and RUQ pain concerning for liver engorgement vs hepatitis. Will check  labs, including cultures, lactic acid for reported fevers at home. Trop/BNP for CHF.   ED Course  I have reviewed the triage vital signs and the nursing notes.  Pertinent labs & imaging results that were available during my care of the patient were reviewed by me and considered in my medical decision making (see chart for details).  Clinical Course as of 07/13/20 1524  Wed Jul 13, 2020  1120 POC hcg is >2000, will send for formal quant.  [CS]  1128 CBC with elevated WBC and anemia, worse from most recent labs a year ago.  [CS]  3220 CMP without significant changes to liver enzymes, Albumin is low which may contribute to peripheral edema. Lipase is normal. Patient would not tolerate lying flat or detailed abdominal exam however, limited POCUS showed large fetus with heart movement. She does not know how far along she is. Spoke with Dr. Ernestina Patches, Ob/gyn who recommends Fetal and toco monitoring while we address her primary medical problems first. She denies any vaginal bleeding, gush of fluids or contraction pain.  [CS]  1215 Patient with poor IV access, I looked at both UE with POCUS but there was no vein amenable to placement of peripheral IV. She cannot lie flat for EJ, will consult IV team, she will likely need PICC or Midline.  [CS]  2542 CXR concerning for possible PNA. Given leukocytosis, will begin treatment for CAP. Not given large volume fluid bolus due to concerns for acute CHF and fluid overload. BP has been adequate.  [CS]  1309 Lactic acid is elevated, maybe due to prolonged hypoxia. Will recheck after adequate oxygenation.  [CS]  1340 BNP not significantly elevated, will give a 500cc bolus of IVF and monitor breathing status closely.  [CS]  7062 Per Ob, patient is 25wk6d gestation. No contractions on monitor and cleared from Ob standpoint. Will discuss admission with hospitalist. IV team is coming to place a midline catheter.  [CS]  1435 UA  also positive. Rocephin should cover this.  [CS]   8347 Significant delay in IV access due to poor veins. IV team will place a midline for meds and fluids.  [CS]  1522 Spoke with Dr. Lorin Mercy who will evaluated in the ED and coordinate with Ob/Gyn for admission.  [CS]    Clinical Course User Index [CS] Truddie Hidden, MD    Final Clinical Impression(s) / ED Diagnoses Final diagnoses:  Sepsis without acute organ dysfunction, due to unspecified organism [facility])  Community acquired pneumonia, unspecified laterality  Nonrheumatic tricuspid valve regurgitation  Acute cystitis without hematuria  [redacted] weeks gestation of pregnancy  Right upper quadrant abdominal pain    Rx / DC Orders ED Discharge Orders    None       Truddie Hidden, MD 07/13/20 1524

## 2020-07-13 NOTE — Progress Notes (Signed)
  Echocardiogram 2D Echocardiogram has been performed.  Kelly [name] 07/13/2020, 5:17 PM

## 2020-07-13 NOTE — ED Notes (Signed)
Report attempted. Spoke w/ CN. Pt will be reassigned to 35M.

## 2020-07-13 NOTE — Progress Notes (Addendum)
[place] at bedside.  EDC 10/20/20. @25  6/7 weeks.  FHR 163.  No bleeding or leaking noted. Updated her on [place].  Cleared by OB service.  Dr Doy Hutching updated.

## 2020-07-13 NOTE — ED Triage Notes (Addendum)
Patient here with complaint of right sided abdominal pain, edema to bilateral lower extremities, and nearly-black urine. Patient reports history of congestive heart failure. Patient alert, oriented, and in no apparent distress.  States her cardiologist told her she needed a valve replacement but not scheduled yet. SpO2 on room air 88%, placed on 2L [place]. SpO2 increased to 93%.

## 2020-07-13 NOTE — Progress Notes (Addendum)
eLink Physician-Brief Progress Note Patient Name: Kelly [name] DOB: [birth year] MRN: [id number]   Date of Service  07/13/2020  HPI/Events of Note  Anemia - Hgb = 6.8. Hgb was = 9.4 at 10:35 AM. No obvious bleeding. Question lab result.   eICU Interventions  Plan: 1. H/H STAT.     Intervention Category Major Interventions: Other:  Lysle Dingwall 07/13/2020, 11:36 PM

## 2020-07-13 NOTE — Consult Note (Addendum)
Date of Admission:  07/13/2020          Reason for Consult:  Likely right-sided endocarditis with right heart failure and septic emboli to the lungs in patient with IV drug use who is [redacted] weeks pregnant    Referring Provider: Dr Lorin Mercy   Assessment:  1. Likely right-sided endocarditis with  2. Septic emboli to the lungs 3. Right-sided heart failure 4. Diffuse rash 5. Relapsed IV drug use with fentanyl 6. [redacted] weeks pregnant 7. 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 Blood cultures have been taken 2. We will start vancomycin and Unasyn to cover usual suspects though is she worsens would go to cefepime from unasyn 3. Agree with transthoracic echocardiogram and she will need a transesophageal echocardiogram 4. Would obtain blood gas and agree to placing her in a monitored unit such as an ICU 5. Cardiology and OB have been consulted 6. Recheck Hepatitis panel and HIV if not checked  Dr Gale Journey will see the patient tomorrow with Janene Madeira and I will be back on Friday.  Active Problems:   * No active hospital problems. *   Scheduled Meds: Continuous Infusions: PRN Meds:.sodium chloride flush  HPI: Kelly [name] is a 35 y.o. female who has been seen several times by our service.  She had prior methicillin sensitive Staph aureus tricuspid valve and mitral valve endocarditis with lumbar spine discitis osteomyelitis epidural abscess requiring Neurosurgery with Dr. Arnoldo Morale in 2019.  He completed 8 weeks of cefazolin in the hospital at that time.  We saw her again in February 2020 when she was admitted with Enterococcus faecalis bacteremia and found to have tricuspid  valve endocarditis with septic emboli to the lungs.  She also had influenza at the time and was also pregnant.  She completed 6 weeks of dual beta-lactam therapy in the hospital and was able to successfully deliver her baby.  She never was seen by [place] in infectious disease for follow-up but did follow-up with cardiology and cardiothoracic surgery  Dr. Haroldine Laws had performed repeat transesophageal echocardiogram in January 2021 which showed near complete destruction of the anterior leaf of the tricuspid valve with severe tricuspid regurgitation.  Patient was seen by Dr. Servando Snare in March 2021 continued consideration of valve replacement.  Time she was also having trouble with her teeth.  At that time she was asymptomatic and Dr. Servando Snare felt that she was not appropriate for cardiothoracic surgery in the context of her complicated social situation.  She reportedly was drug-free at that time.  Since she has had relapse of her IV drug use.  She was also hospitalized at behavioral health roughly a year ago with suicidal ideation amd IVC by her mother when she was found passed out in her room with her 99-monthold daughter on the floor.  Child was taken to DSS at that time.  She now presented the emergency department with a 3 to 4-week history of worsening bilateral lower extremity edema along with worsening dyspnea shortness of breath in the past 4 days.  She had has a rash that is diffuse and on her arms legs and back and abdomen which also said and then over the last several days.  She  has been febrile over several weeks.  Of time and complaining of pain all over but also in particular with deep breathing and on her flanks bilaterally.  She admits to relapsed IV drug use including IV heroin and fentanyl.  On exam she is visibly uncomfortable sitting at the bedside upright leaning on her hands and tachypneic.  Blood cultures have been taken and transthoracic echocardiogram is pending.  She has  been seen by obstetrics and her fetus is still viable.  She is asking me for pain medicines and concerned that she is going through withdrawal.  I will start her on vancomycin and Unasyn to cover the pathogens we have seen before along with MRSA,  but certainly she could have other organisms as well.  If she were to continue to worsen would consider broadening to pseudomonal coverage with cefepime in place of Unasyn  We will follow cultures closely.  I agree with transferring to a unit where she can might be monitored very closely.  Do not think will be able to do much in the way of imaging at this point in time certainly not any MRIs given her discomfort and distress. Certainly her pregnancy also complicates risk of other scans as well.      Review of Systems: Review of Systems  Constitutional: Positive for chills, diaphoresis, fever and malaise/fatigue. Negative for weight loss.  HENT: Negative for congestion and sore throat.   Eyes: Negative for blurred vision and photophobia.  Respiratory: Positive for cough and shortness of breath. Negative for wheezing.   Cardiovascular: Positive for chest pain, palpitations, leg swelling and PND.  Gastrointestinal: Positive for nausea. Negative for abdominal pain, blood in stool, constipation, diarrhea, heartburn, melena and vomiting.  Genitourinary: Positive for flank pain. Negative for dysuria and hematuria.  Musculoskeletal: Positive for back pain, joint pain and myalgias. Negative for falls.  Skin: Positive for rash. Negative for itching.  Neurological: Positive for weakness and headaches. Negative for dizziness, focal weakness and loss of consciousness.  Endo/Heme/Allergies: Does not bruise/bleed easily.  Psychiatric/Behavioral: Positive for substance abuse. Negative for depression and suicidal ideas. The patient is nervous/anxious. The patient does not have insomnia.    She has an extensive scar through her neurosurgery  Rash on arms,  legs 07/13/2020:        Past Medical History:  Diagnosis Date  . 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 Abscess of breast    rt breast  . ADD (attention deficit disorder)   . Back pain   . Bipolar 1 disorder ([place])   . Endocarditis 2020  . Hypertension   . Leukocytosis   . Polysubstance abuse ([place])   . Pyelonephritis affecting pregnancy 05/24/2016  . Sepsis ([place]) 12/02/2017  . Suicidal overdose ([facility])     Social History   Tobacco Use  . Smoking status: Former Smoker    Packs/day: 1.00    Years: 13.00    Pack years: 13.00    Types: Cigarettes    Quit date: 06/13/2018    Years since quitting: 2.0  . Smokeless tobacco: Never Used  Vaping Use  . Vaping Use: Never used  Substance Use Topics  . Alcohol use: Not Currently    Comment: one or two drinks weekly  . Drug use: Yes    Types: Cocaine, Heroin, IV, Fentanyl    Comment: heroin, crack cocaine, fentanyl last used today (07/13/20)     Family History  Problem Relation Age of Onset  . Diabetes Mother   .  Diabetes Father   . Hypertension Father   . Heart attack Neg Hx   . Hyperlipidemia Neg Hx   . Sudden death Neg Hx    Allergies  Allergen Reactions  . Hydrocodone Itching  . Morphine And Related Nausea And Vomiting    OBJECTIVE: Blood pressure 128/72, pulse (!) 117, temperature 99.3 F (37.4 C), temperature source Oral, resp. rate (!) 35, SpO2 100 %.  Physical Exam Constitutional:      General: She is in acute distress.     Appearance: She is ill-appearing and diaphoretic.  HENT:     Head: Normocephalic and atraumatic.     Right Ear: External ear normal.     Left Ear: External ear normal.     Nose: Nose normal.     Mouth/Throat:     Mouth: Oropharynx is clear and moist.     Pharynx: No oropharyngeal exudate.  Eyes:     General: No scleral icterus.    Extraocular Movements: Extraocular movements intact and EOM normal.     Conjunctiva/sclera: Conjunctivae normal.  Cardiovascular:      Rate and Rhythm: Regular rhythm. Tachycardia present.     Heart sounds: Normal heart sounds. No murmur heard. No friction rub. No gallop.   Pulmonary:     Effort: Tachypnea present. No respiratory distress.     Breath sounds: Normal breath sounds. No wheezing.  Abdominal:     General: Bowel sounds are normal. There is distension.     Palpations: Abdomen is soft.     Tenderness: There is abdominal tenderness. There is no rebound.     Hernia: No hernia is present.  Musculoskeletal:        General: No tenderness or edema. Normal range of motion.     Cervical back: Normal range of motion and neck supple.  Lymphadenopathy:     Cervical: No cervical adenopathy.  Skin:    General: Skin is warm.     Coloration: Skin is not pale.     Findings: No erythema or rash.  Neurological:     General: No focal deficit present.     Mental Status: She is alert and oriented to person, place, and time.     Coordination: Coordination normal.  Psychiatric:        Attention and Perception: Attention normal.        Mood and Affect: Mood is anxious.        Behavior: Behavior is agitated. Behavior is cooperative.        Cognition and Memory: Cognition normal.     Lab Results Lab Results  Component Value Date   WBC 19.1 (H) 07/13/2020   HGB 9.4 (L) 07/13/2020   HCT 28.5 (L) 07/13/2020   MCV 93.8 07/13/2020   PLT 322 07/13/2020    Lab Results  Component Value Date   CREATININE 0.74 07/13/2020   BUN 11 07/13/2020   NA 129 (L) 07/13/2020   K 3.2 (L) 07/13/2020   CL 94 (L) 07/13/2020   CO2 21 (L) 07/13/2020    Lab Results  Component Value Date   ALT 7 07/13/2020   AST 18 07/13/2020   ALKPHOS 138 (H) 07/13/2020   BILITOT 1.1 07/13/2020     Microbiology: Recent Results (from the past 240 hour(s))  Resp Panel by RT-PCR (Flu A&B, Covid) Nasopharyngeal Swab     Status: None   Collection Time: 07/13/20 11:21 AM   Specimen: Nasopharyngeal Swab; Nasopharyngeal(NP) swabs in vial transport medium   Result Value Ref  Range Status   SARS Coronavirus 2 by RT PCR NEGATIVE NEGATIVE Final    Comment: (NOTE) SARS-CoV-2 target nucleic acids are NOT DETECTED.  The SARS-CoV-2 RNA is generally detectable in upper respiratory specimens during the acute phase of infection. The lowest concentration of SARS-CoV-2 viral copies this assay can detect is 138 copies/mL. A negative result does not preclude SARS-Cov-2 infection and should not be used as the sole basis for treatment or other patient management decisions. A negative result may occur with  improper specimen collection/handling, submission of specimen other than nasopharyngeal swab, presence of viral mutation(s) within the areas targeted by this assay, and inadequate number of viral copies(<138 copies/mL). A negative result must be combined with clinical observations, patient history, and epidemiological information. The expected result is Negative.  Fact Sheet for Patients:  EntrepreneurPulse.com.au  Fact Sheet for Healthcare Providers:  IncredibleEmployment.be  This test is no t yet approved or cleared by the [place] FDA and  has been authorized for detection and/or diagnosis of SARS-CoV-2 by FDA under an Emergency Use Authorization (EUA). This EUA will remain  in effect (meaning this test can be used) for the duration of the COVID-19 declaration under Section 564(b)(1) of the Act, 21 U.S.C.section 360bbb-3(b)(1), unless the authorization is terminated  or revoked sooner.       Influenza A by PCR NEGATIVE NEGATIVE Final   Influenza B by PCR NEGATIVE NEGATIVE Final    Comment: (NOTE) The Xpert Xpress SARS-CoV-2/FLU/RSV plus assay is intended as an aid in the diagnosis of influenza from Nasopharyngeal swab specimens and should not be used as a sole basis for treatment. Nasal washings and aspirates are unacceptable for Xpert Xpress SARS-CoV-2/FLU/RSV testing.  Fact Sheet for  Patients: EntrepreneurPulse.com.au  Fact Sheet for Healthcare Providers: IncredibleEmployment.be  This test is not yet approved or cleared by the [place] FDA and has been authorized for detection and/or diagnosis of SARS-CoV-2 by FDA under an Emergency Use Authorization (EUA). This EUA will remain in effect (meaning this test can be used) for the duration of the COVID-19 declaration under Section 564(b)(1) of the Act, 21 U.S.C. section 360bbb-3(b)(1), unless the authorization is terminated or revoked.  Performed at [facility], [place] [address], [place], [place] [address]     Alcide Evener, [facility] [phone] pager  07/13/2020, 4:53 PM

## 2020-07-13 NOTE — ED Notes (Signed)
EDP notified of elevated LA

## 2020-07-13 NOTE — Progress Notes (Signed)
Difficult to trace FHT's continuously due to patient inability to sit still and can only sit upright due to difficulty breathing.  On 3L [place] O2.  Sats 94-96%. Intermittent 170's audible with external monitor.  Awaiting stat US to determine OB status.

## 2020-07-14 ENCOUNTER — Encounter (HOSPITAL_COMMUNITY): Payer: Self-pay | Admitting: Pulmonary Disease

## 2020-07-14 DIAGNOSIS — Z3A26 26 weeks gestation of pregnancy: Secondary | ICD-10-CM | POA: Diagnosis not present

## 2020-07-14 DIAGNOSIS — B9562 Methicillin resistant Staphylococcus aureus infection as the cause of diseases classified elsewhere: Secondary | ICD-10-CM

## 2020-07-14 DIAGNOSIS — O0932 Supervision of pregnancy with insufficient antenatal care, second trimester: Secondary | ICD-10-CM

## 2020-07-14 DIAGNOSIS — L03115 Cellulitis of right lower limb: Secondary | ICD-10-CM | POA: Diagnosis not present

## 2020-07-14 DIAGNOSIS — R7881 Bacteremia: Secondary | ICD-10-CM | POA: Diagnosis not present

## 2020-07-14 LAB — MAGNESIUM
Magnesium: 1.2 mg/dL — ABNORMAL LOW (ref 1.7–2.4)
Magnesium: 1.8 mg/dL (ref 1.7–2.4)

## 2020-07-14 LAB — BLOOD CULTURE ID PANEL (REFLEXED) - BCID2

## 2020-07-14 LAB — CBC
HCT: 24.4 % — ABNORMAL LOW (ref 36.0–46.0)
Hemoglobin: 8.4 g/dL — ABNORMAL LOW (ref 12.0–15.0)
MCH: 31.1 pg (ref 26.0–34.0)
MCHC: 34.4 g/dL (ref 30.0–36.0)
MCV: 90.4 fL (ref 80.0–100.0)
Platelets: 238 10*3/uL (ref 150–400)
RBC: 2.7 MIL/uL — ABNORMAL LOW (ref 3.87–5.11)
RDW: 13.9 % (ref 11.5–15.5)
WBC: 20.4 10*3/uL — ABNORMAL HIGH (ref 4.0–10.5)
nRBC: 0 % (ref 0.0–0.2)

## 2020-07-14 LAB — BASIC METABOLIC PANEL
Anion gap: 14 (ref 5–15)
BUN: 7 mg/dL (ref 6–20)
CO2: 19 mmol/L — ABNORMAL LOW (ref 22–32)
Calcium: 8.2 mg/dL — ABNORMAL LOW (ref 8.9–10.3)
Chloride: 96 mmol/L — ABNORMAL LOW (ref 98–111)
Creatinine, Ser: 0.54 mg/dL (ref 0.44–1.00)
GFR, Estimated: >60 mL/min (ref >60)
Glucose, Bld: 103 mg/dL — ABNORMAL HIGH (ref 70–99)
Potassium: 3.4 mmol/L — ABNORMAL LOW (ref 3.5–5.1)
Sodium: 129 mmol/L — ABNORMAL LOW (ref 135–145)

## 2020-07-14 LAB — TYPE AND SCREEN
ABO/RH(D): O POS
Antibody Screen: NEGATIVE

## 2020-07-14 LAB — PHOSPHORUS: Phosphorus: 3.2 mg/dL (ref 2.5–4.6)

## 2020-07-14 LAB — HEMOGLOBIN AND HEMATOCRIT, BLOOD
HCT: 25.3 % — ABNORMAL LOW (ref 36.0–46.0)
Hemoglobin: 8.4 g/dL — ABNORMAL LOW (ref 12.0–15.0)

## 2020-07-14 LAB — LACTIC ACID, PLASMA: Lactic Acid, Venous: 1.5 mmol/L (ref 0.5–1.9)

## 2020-07-14 MED ORDER — FUROSEMIDE 10 MG/ML IJ SOLN
40.0000 mg | Freq: Once | INTRAMUSCULAR | Status: AC
  Administered 2020-07-14: 40 mg via INTRAVENOUS
  Filled 2020-07-14: qty 4

## 2020-07-14 MED ORDER — METHADONE HCL 10 MG PO TABS
20.0000 mg | ORAL_TABLET | Freq: Three times a day (TID) | ORAL | Status: DC
Start: 2020-07-14 — End: 2020-07-17
  Administered 2020-07-14 – 2020-07-17 (×10): 20 mg via ORAL
  Filled 2020-07-14: qty 2
  Filled 2020-07-14: qty 4
  Filled 2020-07-14: qty 2
  Filled 2020-07-14: qty 4
  Filled 2020-07-14 (×2): qty 2
  Filled 2020-07-14: qty 4
  Filled 2020-07-14 (×3): qty 2

## 2020-07-14 MED ORDER — MAGNESIUM SULFATE 4 GM/100ML IV SOLN
4.0000 g | Freq: Once | INTRAVENOUS | Status: AC
  Administered 2020-07-14: 4 g via INTRAVENOUS
  Filled 2020-07-14: qty 100

## 2020-07-14 MED ORDER — MAGNESIUM SULFATE 2 GM/50ML IV SOLN
2.0000 g | Freq: Once | INTRAVENOUS | Status: AC
  Administered 2020-07-14: 2 g via INTRAVENOUS
  Filled 2020-07-14: qty 50

## 2020-07-14 MED ORDER — POTASSIUM CHLORIDE 20 MEQ PO PACK
80.0000 meq | PACK | Freq: Once | ORAL | Status: AC
  Administered 2020-07-14: 80 meq via ORAL
  Filled 2020-07-14: qty 4

## 2020-07-14 MED ORDER — POTASSIUM CHLORIDE CRYS ER 20 MEQ PO TBCR
40.0000 meq | EXTENDED_RELEASE_TABLET | Freq: Once | ORAL | Status: AC
  Administered 2020-07-14: 40 meq via ORAL
  Filled 2020-07-14: qty 2

## 2020-07-14 NOTE — Progress Notes (Addendum)
NAME:  GENEEN DIETER, MRN:  [id number], DOB:  [birth year], LOS: 1 ADMISSION DATE:  07/13/2020, CONSULTATION DATE:  07/13/2020 REFERRING MD:  Dr. Lorin Mercy, CHIEF COMPLAINT:  Sepsis  Brief History:  35 year old female with PMH of IVDA, TV IE 11/2017 and recurrent 06/2018, epidural abscess (2019), MSSA bacteremia 2019 presenting with right sided abd pain and shortness of breath.   History of Present Illness:   35 year old female with prior history of former smoker, current IVDA/ polysubstance abuse with heroin/ fentanyl (relapsed 5 months ago, last use 3/2 am), previous TV IE 11/2017 and recurrent 06/2018, epidural abscess (2019), MSSA bacteremia 2019,  HTN, and Bipolar presenting with abdominal pain and lower extremity edema.   On arrival, noted to be 88% on room air which increased with 2L [place], afebrile, tachycardic 117, and normotensive.  Labs significant for WBC 19.1, Hgb 9.4, Hct 28.5, lactic acid 3.2, Na 129, K 3.2, Cl 94, bicarb 21, alk phos 138, albumin 2.3, BNP 157, CK 68, troponin hs 14-> 19, UA with large leukocytes, positive nitrates, > 50 WBC  positive HCG 12,898 with POCUS ultrasound showing large fetus with heart movement.  Patient was unsure how long she had been pregnant.  Denied any vaginal bleeding, contractions, or fluid loss; no prior prenatal care.  OB consulted and placed on toco monitoring and per OB, patient is 26wk1d gestation and cleared from [name] standpoint. CXR with low volumes and bibasilar airspace opacities concerning for pneumonia.  She was cultured and started on ceftriaxone and azithromycin.     Past Medical History:  Former smoker, IVDA- polysubstance abuse (heroin, fentanyl, cocaine), previous TV IE and epidural abscess (2019), MSSA bacteremia 11/2017, HTN, Biopolar, ADD, scoliosis with spinal rods  Significant Hospital Events:  3/2 Admit  Consults:  OB Cards ID [id number]  Procedures:   Significant Diagnostic Tests:  3/2 CXR>>Low lung volumes with bibasilar airspace  opacities concerning for Pneumonia. Mild cardiomegaly.   3/2 OB US >>  Single intrauterine pregnancy here for a detailed anatomy  due new onset abdominal pain and known substance abuse ( Heroine).  Normal anatomy with measurements consistent with today's examination, Ms. Veach has an uncertain LMP.  There is good fetal movement and amniotic fluid volume.  Suboptimal views of the fetal anatomy were obtained secondary to fetal position and maternal ability to lay flat.    Micro Data:  3/2 SARS/ flu >> neg 3/2 BCx >> RSA 3/2 UC >>  Antimicrobials:  3/2 azithro  3/2 ceftriaxone 3/2 Vancomycin>> 3/2 Cefepime>>  Interim History / Subjective:  Requesting methadone  Objective   Blood pressure 108/60, pulse 95, temperature 97.7 F (36.5 C), temperature source Oral, resp. rate (!) 25, height 5' 7.5" (1.715 m), weight 90.7 kg, SpO2 98 %.        Intake/Output Summary (Last 24 hours) at 07/14/2020 6734 Last data filed at 07/14/2020 0600 Gross per 24 hour  Intake 1784.01 ml  Output 1720 ml  Net 64.01 ml   Filed Weights   07/13/20 1824 07/14/20 0500  Weight: 90.9 kg 90.7 kg    General: 35 year old ill-appearing female who is sedated on Dilaudid but follows commands with Versed HEENT: Mild JVD is appreciated, poor dentition Neuro: No focal defect CV: Heart sounds are distant with faint murmur PULM: Bibasilar crackles appreciated GI: soft, bsx4 active, protuberant abdomen and 26 weeks of pregnancy  Extremities: warm/dry, 3+ lower edema  Skin: Multiple needle marks are noted    Resolved Hospital Problem list  Assessment & Plan:   Sepsis secondary to UTI and PNA, rule out acute IE/ bacteremia with active IVDA Resumed IVDA about 5 months ago Infectious disease is following P: Hemodynamically stable at this time. Cardiology is following, appreciate cardiology's input Appreciate infectious disease input Trend lactic acid Continue vancomycin and cefepime Transition out  of intensive care unit to progressive care and to [facility] as of 07/15/2020        Hx of IVDA/ polysubstance abuse  last use 3/2 am, heroin/ fentanyl  Plan: Transition off Dilaudid to methadone Monitor for withdrawal    Hyponatremia Hypokalemia  Recent Labs  Lab 07/13/20 1035 07/13/20 2119 07/14/20 0157  NA 129* 130* 129*   Recent Labs  Lab 07/13/20 1035 07/13/20 2119 07/14/20 0157  K 3.2* 3.0* 3.4*    P: Monitor replete as necessary    Prolonged QTc Avoid medications of prolonged QTc interval Monitor    Single intrauterine pregnancy OB consulted 07/13/2020 26wk1d gestation by OB US 3/2 P: No OB/GYN interventions are deemed necessary at this time OB is following    Best practice (evaluated daily)  Diet: regular Pain/Anxiety/Delirium protocol (if indicated): Dilaudid 07/14/2020 transition to methadone VAP protocol (if indicated): n/a DVT prophylaxis: lovenox  GI prophylaxis: n/a Glucose control: Not needed Mobility: with assist Disposition:ICU 07/14/2020 transition out of the intensive care unit to progressive care  Goals of Care:  Last date of multidisciplinary goals of care discussion: Family and staff present:  Summary of discussion:  Follow up goals of care discussion due: 3/9 Code Status: Full Code  Labs   CBC: Recent Labs  Lab 07/13/20 1035 07/13/20 2119 07/14/20 0021 07/14/20 0157  WBC 19.1*  --   --  20.4*  HGB 9.4* 6.8* 8.4* 8.4*  HCT 28.5* 20.0* 25.3* 24.4*  MCV 93.8  --   --  90.4  PLT 322  --   --  121    Basic Metabolic Panel: Recent Labs  Lab 07/13/20 1035 07/13/20 2119 07/14/20 0157  NA 129* 130* 129*  K 3.2* 3.0* 3.4*  CL 94*  --  96*  CO2 21*  --  19*  GLUCOSE 138*  --  103*  BUN 11  --  7  CREATININE 0.74  --  0.54  CALCIUM 8.6*  --  8.2*  MG  --   --  1.2*  PHOS  --   --  3.2   GFR: Estimated Creatinine Clearance: 114.7 mL/min (by C-G formula based on SCr of 0.54 mg/dL). Recent Labs  Lab 07/13/20 1035  07/13/20 1219 07/13/20 1530 07/13/20 2110 07/14/20 0021 07/14/20 0157  WBC 19.1*  --   --   --   --  20.4*  LATICACIDVEN  --  3.2* 2.0* 1.4 1.5  --     Liver Function Tests: Recent Labs  Lab 07/13/20 1035 07/13/20 2110  AST 18 14*  ALT 7 6  ALKPHOS 138* 128*  BILITOT 1.1 1.3*  PROT 7.1 6.3*  ALBUMIN 2.3* 2.0*   Recent Labs  Lab 07/13/20 1035  LIPASE 19   No results for input(s): AMMONIA in the last 168 hours.  ABG    Component Value Date/Time   PHART 7.432 07/13/2020 2119   PCO2ART 35.1 07/13/2020 2119   PO2ART 74 (L) 07/13/2020 2119   HCO3 23.5 07/13/2020 2119   TCO2 25 07/13/2020 2119   ACIDBASEDEF 1.0 07/13/2020 2119   O2SAT 95.0 07/13/2020 2119     Coagulation Profile: No results for input(s): INR, PROTIME in the  last 168 hours.  Cardiac Enzymes: Recent Labs  Lab 07/13/20 1219  CKTOTAL 68    HbA1C: Hgb A1c MFr Bld  Date/Time Value Ref Range Status  07/13/2020 07:52 PM 4.6 (L) 4.8 - 5.6 % Final    Comment:    (NOTE) Pre diabetes:          5.7%-6.4%  Diabetes:              >6.4%  Glycemic control for   <7.0% adults with diabetes   06/26/2019 06:12 PM 5.1 4.8 - 5.6 % Final    Comment:    (NOTE) Pre diabetes:          5.7%-6.4% Diabetes:              >6.4% Glycemic control for   <7.0% adults with diabetes     CBG: Recent Labs  Lab 07/13/20 2039  GLUCAP 108*      Critical care time:     Richardson Landry Minor ACNP Acute Care Nurse Practitioner Maryanna Shape Pulmonary/Critical Care Please consult Amion 07/14/2020, 8:29 AM     Attending note: I have seen and examined the patient. History, labs and imaging reviewed.  35 year old with polysubstance and intravenous drug abuse, recurrent endocarditis, epidural abscess presenting with sepsis, staph aureus bacteremia, suspected right-sided endocarditis, 26 weeks pregnancy She has remained hemodynamically stable overnight in the ICU  Blood pressure 115/74, pulse 92, temperature 97.7 F (36.5 C),  temperature source Oral, resp. rate 18, height 5' 7.5" (1.715 m), weight 90.7 kg, SpO2 95 %, not currently breastfeeding. Gen:      No acute distress, ill-appearing HEENT:  EOMI, sclera anicteric, poor dentition Neck:     No masses; no thyromegaly Lungs:    Bibasal crackles CV:         Regular rate and rhythm; no murmurs Abd:      + bowel sounds; soft, non-tender; no palpable masses, no distension Ext:    No edema; adequate peripheral perfusion Skin:      Warm and dry; no rash Neuro: Somnolent, arousable  Labs/Imaging personally reviewed, significant for Sodium 129, BUN/creatinine 7/0.54 WBC 20.4, hemoglobin 8.4, platelets 238 No new imaging  Assessment/plan: Staph aureus bacteremia, sepsis present on admission Presumed right-sided endocarditis with heart failure, RV dysfunction Antibiotics narrowed to vancomycin per ID TEE when stable Lasix for diuresis  Intravenous drug use, polysubstance abuse Start methadone  Stable for transfer out of ICU and to hospitalist service  Marshell Garfinkel MD [facility] 07/14/2020, 2:57 PM

## 2020-07-14 NOTE — Progress Notes (Signed)
PHARMACY - PHYSICIAN COMMUNICATION CRITICAL VALUE ALERT - BLOOD CULTURE IDENTIFICATION (BCID)  Kelly [name] is an 35 y.o. female who presented to [facility] on 07/13/2020 with a chief complaint of likely R-sided endocarditis  Assessment: MRSA bacteremia (likely endocarditis with septic emboli in lungs)  Name of physician (or Provider) Contacted: Dr. Oletta Darter  Current antibiotics: Cefepime and Vancomycin  Changes to prescribed antibiotics recommended:  Pt is covered with current antibiotics. Dr. Oletta Darter will wait for ID to narrow coverage in the morning as they see appropriate.  Results for orders placed or performed during the hospital encounter of 07/13/20  Blood Culture ID Panel (Reflexed) (Collected: 07/13/2020 12:20 PM)  Result Value Ref Range   Enterococcus faecalis NOT DETECTED NOT DETECTED   Enterococcus Faecium NOT DETECTED NOT DETECTED   Listeria monocytogenes NOT DETECTED NOT DETECTED   Staphylococcus species DETECTED (A) NOT DETECTED   Staphylococcus aureus (BCID) DETECTED (A) NOT DETECTED   Staphylococcus epidermidis NOT DETECTED NOT DETECTED   Staphylococcus lugdunensis NOT DETECTED NOT DETECTED   Streptococcus species NOT DETECTED NOT DETECTED   Streptococcus agalactiae NOT DETECTED NOT DETECTED   Streptococcus pneumoniae NOT DETECTED NOT DETECTED   Streptococcus pyogenes NOT DETECTED NOT DETECTED   A.calcoaceticus-baumannii NOT DETECTED NOT DETECTED   Bacteroides fragilis NOT DETECTED NOT DETECTED   Enterobacterales NOT DETECTED NOT DETECTED   Enterobacter cloacae complex NOT DETECTED NOT DETECTED   Escherichia coli NOT DETECTED NOT DETECTED   Klebsiella aerogenes NOT DETECTED NOT DETECTED   Klebsiella oxytoca NOT DETECTED NOT DETECTED   Klebsiella pneumoniae NOT DETECTED NOT DETECTED   Proteus species NOT DETECTED NOT DETECTED   Salmonella species NOT DETECTED NOT DETECTED   Serratia marcescens NOT DETECTED NOT DETECTED   Haemophilus influenzae NOT DETECTED NOT  DETECTED   Neisseria meningitidis NOT DETECTED NOT DETECTED   Pseudomonas aeruginosa NOT DETECTED NOT DETECTED   Stenotrophomonas maltophilia NOT DETECTED NOT DETECTED   Candida albicans NOT DETECTED NOT DETECTED   Candida auris NOT DETECTED NOT DETECTED   Candida glabrata NOT DETECTED NOT DETECTED   Candida krusei NOT DETECTED NOT DETECTED   Candida parapsilosis NOT DETECTED NOT DETECTED   Candida tropicalis NOT DETECTED NOT DETECTED   Cryptococcus neoformans/gattii NOT DETECTED NOT DETECTED   Meth resistant mecA/C and MREJ DETECTED (A) NOT DETECTED    Sherlon Handing, PharmD, BCPS Please see amion for complete clinical pharmacist phone list 07/14/2020  1:47 AM

## 2020-07-14 NOTE — Progress Notes (Signed)
Advanced Heart Failure Rounding Note   Subjective:    Hemodynamically more stable overnight. Got one dose of lasix last night with ~ 2L out.   On IV abx. Still complaining of pain in her R flank. Creatinine stable. Lactic acidosis resolving.    Objective:   Weight Range:  Vital Signs:   Temp:  [97.6 F (36.4 C)-99.3 F (37.4 C)] 97.7 F (36.5 C) (03/03 0700) Pulse Rate:  [95-123] 95 (03/03 0700) Resp:  [17-47] 25 (03/03 0700) BP: (101-132)/(51-84) 108/60 (03/03 0700) SpO2:  [88 %-100 %] 98 % (03/03 0700) Weight:  [90.7 kg-90.9 kg] 90.7 kg (03/03 0500) Last BM Date:  (pta)  Weight change: Filed Weights   07/13/20 1824 07/14/20 0500  Weight: 90.9 kg 90.7 kg    Intake/Output:   Intake/Output Summary (Last 24 hours) at 07/14/2020 0748 Last data filed at 07/14/2020 0600 Gross per 24 hour  Intake 1784.01 ml  Output 1720 ml  Net 64.01 ml     Physical Exam: General:  Weak appearing. No resp difficulty HEENT: normal Neck: supple. JVP ok . Carotids 2+ bilat; no bruits. No lymphadenopathy or thryomegaly appreciated. Cor: PMI nondisplaced. Regular rate & rhythm.3/6 TR Lungs: clear Abdomen: + gravid mildly tender RUW. No hepatosplenomegaly. No bruits or masses. Good bowel sounds. Extremities: no cyanosis, clubbing, rash, 1-2+ edema + track marks and rashe Neuro: alert & orientedx3, cranial nerves grossly intact. moves all 4 extremities w/o difficulty. Affect pleasant  Telemetry: Sinus 90s Personally reviewed   Labs: Basic Metabolic Panel: Recent Labs  Lab 07/13/20 1035 07/13/20 2119 07/14/20 0157  NA 129* 130* 129*  K 3.2* 3.0* 3.4*  CL 94*  --  96*  CO2 21*  --  19*  GLUCOSE 138*  --  103*  BUN 11  --  7  CREATININE 0.74  --  0.54  CALCIUM 8.6*  --  8.2*  MG  --   --  1.2*  PHOS  --   --  3.2    Liver Function Tests: Recent Labs  Lab 07/13/20 1035 07/13/20 2110  AST 18 14*  ALT 7 6  ALKPHOS 138* 128*  BILITOT 1.1 1.3*  PROT 7.1 6.3*  ALBUMIN  2.3* 2.0*   Recent Labs  Lab 07/13/20 1035  LIPASE 19   No results for input(s): AMMONIA in the last 168 hours.  CBC: Recent Labs  Lab 07/13/20 1035 07/13/20 2119 07/14/20 0021 07/14/20 0157  WBC 19.1*  --   --  20.4*  HGB 9.4* 6.8* 8.4* 8.4*  HCT 28.5* 20.0* 25.3* 24.4*  MCV 93.8  --   --  90.4  PLT 322  --   --  238    Cardiac Enzymes: Recent Labs  Lab 07/13/20 1219  CKTOTAL 68    BNP: BNP (last 3 results) Recent Labs    07/13/20 1219  BNP 157.1*    ProBNP (last 3 results) No results for input(s): PROBNP in the last 8760 hours.    Other results:  Imaging: DG Chest 2 View  Result Date: 07/13/2020 CLINICAL DATA:  Shortness of breath EXAM: CHEST - 2 VIEW COMPARISON:  07/26/2018 FINDINGS: Low lung volumes. Bibasilar airspace opacities. Mild cardiomegaly. No effusions. No acute bony abnormality. IMPRESSION: Low lung volumes with bibasilar airspace opacities concerning for pneumonia. Mild cardiomegaly. Electronically Signed   By: Rolm Baptise M.D.   On: 07/13/2020 11:33   ECHOCARDIOGRAM LIMITED  Result Date: 07/13/2020    ECHOCARDIOGRAM REPORT   Patient Name:  Kelly [name] Date of Exam: 07/13/2020 Medical Rec #:  [id number]          Height:       67.5 in Accession #:    [id number]         Weight:       177.0 lb Date of Birth:  February 24, 1986           BSA:          1.931 m Patient Age:    34 years           BP:           128/72 mmHg Patient Gender: F                  HR:           125 bpm. Exam Location:  Inpatient Procedure: Limited Color Doppler and Cardiac Doppler STAT ECHO Indications:     endocarditis  History:         Patient has prior history of Echocardiogram examinations, most                  recent 05/21/2019. Signs/Symptoms:Bacteremia, Dyspnea and Chest                  Pain; Risk Factors:IV drug use.  Sonographer:     Johny Chess Referring Phys:  [place] Diagnosing Phys: Loralie Champagne MD IMPRESSIONS  1. Left ventricular ejection fraction, by  estimation, is 55%. The left ventricle has normal function. The left ventricle has no regional wall motion abnormalities. Left ventricular diastolic parameters are indeterminate.  2. Right ventricular systolic function is moderately reduced. The right ventricular size is severely enlarged. There is severely elevated pulmonary artery systolic pressure. The estimated right ventricular systolic pressure is 86.5 mmHg.  3. Right atrial size was severely dilated.  4. A small pericardial effusion is present.  5. The mitral valve is normal in structure. No evidence of mitral valve regurgitation. No evidence of mitral stenosis.  6. The tricuspid valve is abnormal. Tricuspid valve regurgitation is severe.  7. The aortic valve is tricuspid. Aortic valve regurgitation is not visualized. No aortic stenosis is present.  8. The inferior vena cava is dilated in size with <50% respiratory variability, suggesting right atrial pressure of 15 mmHg.  9. No definite endocarditis was visualized. However, the right heart was dilated and dysfunctional with severe pulmonary hypertension and severe TR. FINDINGS  Left Ventricle: Left ventricular ejection fraction, by estimation, is 55%. The left ventricle has normal function. The left ventricle has no regional wall motion abnormalities. The left ventricular internal cavity size was normal in size. There is no left ventricular hypertrophy. Left ventricular diastolic parameters are indeterminate. Right Ventricle: The right ventricular size is severely enlarged. No increase in right ventricular wall thickness. Right ventricular systolic function is moderately reduced. There is severely elevated pulmonary artery systolic pressure. The tricuspid regurgitant velocity is 4.40 m/s, and with an assumed right atrial pressure of 15 mmHg, the estimated right ventricular systolic pressure is 78.4 mmHg. Left Atrium: Left atrial size was normal in size. Right Atrium: Right atrial size was severely dilated.  Pericardium: A small pericardial effusion is present. Mitral Valve: The mitral valve is normal in structure. No evidence of mitral valve regurgitation. No evidence of mitral valve stenosis. Tricuspid Valve: The tricuspid valve is abnormal. Tricuspid valve regurgitation is severe. Aortic Valve: The aortic valve is tricuspid. Aortic valve regurgitation is not visualized. No aortic stenosis is  present. Pulmonic Valve: The pulmonic valve was normal in structure. Pulmonic valve regurgitation is not visualized. Aorta: The aortic root is normal in size and structure. Venous: The inferior vena cava is dilated in size with less than 50% respiratory variability, suggesting right atrial pressure of 15 mmHg. IAS/Shunts: No atrial level shunt detected by color flow Doppler.  IVC IVC diam: 2.20 cm RIGHT ATRIUM           Index RA Area:     32.10 cm RA Volume:   136.00 ml 70.45 ml/m  TRICUSPID VALVE TR Peak grad:   77.4 mmHg TR Vmax:        440.00 cm/s Loralie Champagne MD Electronically signed by Loralie Champagne MD Signature Date/Time: 07/13/2020/5:19:31 PM    Final (Updated)    [place] MFM OB COMP + 14 WK  Result Date: 07/13/2020 ----------------------------------------------------------------------  OBSTETRICS REPORT                       (Signed Final 07/13/2020 02:48 pm) ---------------------------------------------------------------------- Patient Info  ID #:       [id number]                          D.O.B.:  Jul 11, 1985 (34 yrs)  Name:       Kelly [name]              Visit Date: 07/13/2020 02:08 pm ---------------------------------------------------------------------- Performed By  Attending:        Sander Nephew      Ref. Address:     [facility]                    MD                                                             GYN  Performed By:     Valda Favia          [facility]                    [place]  Referred By:      Annalee Genta DO ---------------------------------------------------------------------- Orders  #  Description                           Code        Ordered By  1  [place] MFM OB COMP + 14 WK                16010.93    Janyth Pupa ----------------------------------------------------------------------  #  Order #                     Accession #                Episode #  1  [id number]  [id number]                 [id number] ---------------------------------------------------------------------- Indications  [redacted] weeks gestation of pregnancy                Z3A.26  Encounter for uncertain dates                  C58.52  Drug use complicating pregnancy, third         O23.323  trimester  Advanced maternal age multigravida 53+,        O57.523  third trimester  Previous cesarean delivery, antepartum x1      O34.219  Abdominal pain in pregnancy                    O99.89 ---------------------------------------------------------------------- Fetal Evaluation  Num Of Fetuses:         1  Fetal Heart Rate(bpm):  169  Cardiac Activity:       Observed  Presentation:           Breech  Placenta:               Anterior  P. Cord Insertion:      Not well visualized  Amniotic Fluid  AFI FV:      Within normal limits                              Largest Pocket(cm)                              5.6 ---------------------------------------------------------------------- Biometry  BPD:      64.8  mm     G. Age:  26w 1d         41  %    CI:        75.49   %    70 - 86                                                          FL/HC:      18.8   %    18.6 - 20.4  HC:      236.5  mm     G. Age:  25w 5d         14  %    HC/AC:      1.01        1.04 - 1.22  AC:      234.2  mm     G. Age:  27w 5d         86  %    FL/BPD:     68.7   %    71 - 87  FL:       44.5  mm     G. Age:  24w 5d          5  %    FL/AC:      19.0   %    20 - 24  HUM:      42.2  mm     G. Age:  25w 3d         27  %  Est. FW:     930  gm  2 lb 1 oz     49  %  ---------------------------------------------------------------------- OB History  Gravidity:    7         Term:   3        Prem:   0        SAB:   3  TOP:          0       Ectopic:  0        Living: 3 ---------------------------------------------------------------------- Gestational Age  U/S Today:     26w 1d                                        EDD:   10/18/20  Best:          26w 1d     Det. By:  U/S (07/13/20)           EDD:   10/18/20 ---------------------------------------------------------------------- Anatomy  Cranium:               Appears normal         LVOT:                   Not well visualized  Cavum:                 Appears normal         Aortic Arch:            Not well visualized  Ventricles:            Not well visualized    Ductal Arch:            Not well visualized  Choroid Plexus:        Appears normal         Diaphragm:              Appears normal  Cerebellum:            Not well visualized    Stomach:                Appears normal, left                                                                        sided  Posterior Fossa:       Not well visualized    Abdomen:                Appears normal  Nuchal Fold:           Not applicable (>87    Abdominal Wall:         Not well visualized                         wks GA)  Face:                  Not well visualized    Cord Vessels:           Appears normal (3  vessel cord)  Lips:                  Not well visualized    Kidneys:                Not well visualized  Palate:                Not well visualized    Bladder:                Appears normal  Thoracic:              Appears normal         Spine:                  Not well visualized  Heart:                 Not well visualized    Upper Extremities:      Visualized  RVOT:                  Not well visualized    Lower Extremities:      Visualized  Other:  Fetus appears to be a female. Technically difficult due to maternal           habitus and fetal position. ---------------------------------------------------------------------- Cervix Uterus Adnexa  Cervix  Not visualized (advanced GA >24wks)  Uterus  No abnormality visualized.  Right Ovary  No adnexal mass visualized.  Left Ovary  No adnexal mass visualized.  Cul De Sac  No free fluid seen.  Adnexa  No abnormality visualized. ---------------------------------------------------------------------- Impression  Single intrauterine pregnancy here for a detailed anatomy  due new onset abdominal pain and known substance abuse (  Heroine).  Normal anatomy with measurements consistent with today's  examination, Ms. Vermette has an uncertain LMP.  There is good fetal movement and amniotic fluid volume  Suboptimal views of the fetal anatomy were obtained  secondary to fetal position and maternal ability to lay flat. ---------------------------------------------------------------------- Recommendations  Repeat growth and anatomy in 3 weeks. ----------------------------------------------------------------------               Sander Nephew, MD Electronically Signed Final Report   07/13/2020 02:48 pm ----------------------------------------------------------------------  US Abdomen Limited RUQ (LIVER/GB)  Result Date: 07/13/2020 CLINICAL DATA:  Abdomen pain pregnant patient EXAM: ULTRASOUND ABDOMEN LIMITED RIGHT UPPER QUADRANT COMPARISON:  CT 01/15/2019 FINDINGS: Gallbladder: Surgically absent Common bile duct: Diameter: 3.4 mm Liver: No focal lesion identified. Within normal limits in parenchymal echogenicity. Portal vein is patent on color Doppler imaging with normal direction of blood flow towards the liver. Other: Mild right hydronephrosis. IMPRESSION: 1. Status post cholecystectomy. 2. Mild right hydronephrosis Electronically Signed   By: Donavan Foil M.D.   On: 07/13/2020 22:14      Medications:     Scheduled Medications: . sodium chloride   Intravenous Once  . acetaminophen  650 mg  Oral Q6H  . Chlorhexidine Gluconate Cloth  6 each Topical Q0600  . enoxaparin (LOVENOX) injection  40 mg Subcutaneous Q24H  . insulin aspart  0-9 Units Subcutaneous TID WC  . mupirocin ointment  1 application Nasal BID     Infusions: . ceFEPime (MAXIPIME) IV 200 mL/hr at 07/14/20 0600  . vancomycin Stopped (07/14/20 0439)     PRN Medications:  docusate sodium, HYDROmorphone (DILAUDID) injection, polyethylene glycol, prochlorperazine, sodium chloride flush   Assessment/Plan:   1. MRSA Sepsis  - bcx 2/2 + for MRSA - clinically improved with  IV abx and volume resuscitation.  - ID has seen and adjusting abx. Will need 6 weeks.  - no obvious vegetation on valve on TTE but need to assume she has recurrent endocarditis. Doubt TEE would change management but I will discuss with ID  2. Acute on Chronic Systolic CHF with Mostly Right Sided CHF (Likely Secondary to Severe TR) - Echo 07/13/20 showed normalization of LV function  LVEF of 55% with normal wall motion. RV severely enlarged with moderately reduced systolic function and severely elevated PASP of 92.106mHg. Severe TR noted but no vegetation visualized.  - Remains volume overloaded. IV lasix 40 IV x 1 today. Supp K  3. 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 He was seen by Dr. GServando Snarein 05/2019 for possible surgical repair. However, patient was lost to follow-up and unfortunately started using drugs again. - Echo this admission shows severe TR but no signs of vegetation. - no obvious vegetation on valve on TTE but need to assume she has recurrent endocarditis. Doubt TEE would change management but I will discuss with ID  4. [redacted] weeks pregnant - u/s ok  I called and spoke with her mom MLesleigh [phone] updated her. She had not talked to her daughter for > 1 year and said she tracks a prostitution website to see  if her daughter is signed in to make sure she is still alive.   CRITICAL CARE Performed by: BGlori Bickers Total critical care time: 35 minutes  Critical care time was exclusive of separately billable procedures and treating other patients.  Critical care was necessary to treat or prevent imminent or life-threatening deterioration.  Critical care was time spent personally by me (independent of midlevel providers or residents) on the following activities: development of treatment plan with patient and/or surrogate as well as nursing, discussions with consultants, evaluation of patient's response to treatment, examination of patient, obtaining history from patient or surrogate, ordering and performing treatments and interventions, ordering and review of laboratory studies, ordering and review of radiographic studies, pulse oximetry and re-evaluation of patient's condition.     Length of Stay: 1   DGlori BickersMD 07/14/2020, 7:48 AM  Advanced Heart Failure Team Pager [phone](M-F; [address]  Please contact [facility] for night-coverage after hours (4p -7a ) and weekends on [email or web address]

## 2020-07-14 NOTE — Progress Notes (Signed)
[facility] for Infectious Disease  Date of Admission:  07/13/2020      Total days of antibiotics 1  Vancomycin   Cefepime            ASSESSMENT: Kelly [name] is a 35 y.o. female admitted with fevers, shortness of breath x 1 month. History of relapse with IV heroin/fentanyl 12mago with current use prior to current admission.  No skin / soft tissue abscesses that appear acutely infected. She has severe tricuspid valve regurgitation and history of 2 episodes of native valve endocarditis (MSSA and enterococcus/Strep mitis/sanguinous) in the past. She is now growing MRSA in her blood with evidence of likely septic pulmonary emboli on CXR. Requiring 4-5 L of supplemental oxygen and in ICU for monitoring for now. Can stop Cefepime as this is all most likely due to MRSA. Some R flank pain - she has mild R hydronephrosis and some mild R flank tenderness. No bony vertebral pain - has a history of vertebral infection requiring NSGY intervention in 2019.  Will continue to follow for evolution of potential metastatic areas of infection.    Continue Vancomycin alone to treat her likely new bacterial endocarditis. Follow SCr.   Pregnancy gestation ~26 weeks dating by ultrasound, no prenatal care - will screen Hep B, hep C and syphilis. HIV non-reactive. Would also obtain urine chlamydia at some point when catheter removed vs vaginal swab. Not urgently necessary now. OB/MFM following.   Hep C Ab (+) in 2020 = will draw Hep B sAg and RNA to confirm chronic infection.     PLAN: 1. Continue vancomycin alone 2. Plan to repeat blood cultures in AM - hold on central line if her condition permit 3. TEE to be planned - Dr. BHaroldine Lawsfollowing 4. Screen for STIs given rash and current pregnancy.  5. Hepatitis B Ag and Hep C RNA to be drawn.  6. OB/MFM following   Principal Problem:   MRSA bacteremia Active Problems:   Community acquired pneumonia   Tricuspid regurgitation   Opioid  use disorder, severe, dependence (HCC)   No prenatal care in current pregnancy in third trimester   History of bacterial endocarditis   Dental caries   Acute CHF (congestive heart failure) (HCC)   Normal IUP (intrauterine pregnancy) on prenatal ultrasound, third trimester   [redacted] weeks gestation of pregnancy   . sodium chloride   Intravenous Once  . acetaminophen  650 mg Oral Q6H  . Chlorhexidine Gluconate Cloth  6 each Topical Q0600  . enoxaparin (LOVENOX) injection  40 mg Subcutaneous Q24H  . methadone  20 mg Oral Q8H  . mupirocin ointment  1 application Nasal BID    SUBJECTIVE: Not much to say today - moaning and sleepy. She reports fevers and illness for about a month. Ongoing IV opioid abuse. Right lateral foot/heel pain - able to walk on this prior to hospitalization. No back pain, headaches, dizziness, vision changes. +abdominal pain. Currently complaining of "feeling like she has to pee" and burning with the catheter.   Previously treated for native tricuspid valve endocarditis in 2019 (MSSA 6 week course cefazolin in the hospital) and Feb 2020 (enterococcus faecalis, Strep mitis, Strep sanguinous, treated with 6 week course of IV vancomycin + ampicillin + ceftriaxone).      Review of Systems: Review of Systems  Constitutional: Positive for chills and fever.  Respiratory: Positive for cough and shortness of breath.   Cardiovascular: Positive for chest pain  and leg swelling.  Gastrointestinal: Positive for abdominal pain. Negative for diarrhea, nausea and vomiting.  Genitourinary: Dysuria: catheter discomfort.  Musculoskeletal: Negative for back pain.  Skin: Positive for rash. Negative for itching.  Neurological: Negative for dizziness, tremors, weakness and headaches.    Allergies  Allergen Reactions  . Hydrocodone Itching  . Morphine And Related Nausea And Vomiting    OBJECTIVE: Vitals:   07/14/20 0400 07/14/20 0500 07/14/20 0600 07/14/20 0700  BP: (!) 109/58 103/63  104/60 108/60  Pulse: (!) 115 (!) 104 (!) 101 95  Resp: (!) 33 (!) 25 (!) 31 (!) 25  Temp: 98.6 F (37 C)   97.7 F (36.5 C)  TempSrc: Oral   Oral  SpO2: 93% 94% 98% 98%  Weight:  90.7 kg    Height:       Body mass index is 30.86 kg/m.  Physical Exam Vitals reviewed.  Constitutional:      Appearance: She is well-developed. She is ill-appearing.  HENT:     Mouth/Throat:     Mouth: Mucous membranes are moist.     Pharynx: Oropharynx is clear.  Cardiovascular:     Rate and Rhythm: Regular rhythm. Tachycardia present.     Heart sounds: Murmur heard.    Pulmonary:     Effort: Pulmonary effort is normal. No respiratory distress.     Breath sounds: Rhonchi (coughing frequently) present.  Abdominal:     General: Bowel sounds are normal.     Palpations: Abdomen is soft.     Comments: Pregnancy noted  Skin:    General: Skin is warm.     Findings: Rash (multiple old scars scattered along legs. Few lipoms appreciated under the skin to thighs. Hands / feet without any rashes. ) present.  Neurological:     General: No focal deficit present.     Lab Results Lab Results  Component Value Date   WBC 20.4 (H) 07/14/2020   HGB 8.4 (L) 07/14/2020   HCT 24.4 (L) 07/14/2020   MCV 90.4 07/14/2020   PLT 238 07/14/2020    Lab Results  Component Value Date   CREATININE 0.54 07/14/2020   BUN 7 07/14/2020   NA 129 (L) 07/14/2020   K 3.4 (L) 07/14/2020   CL 96 (L) 07/14/2020   CO2 19 (L) 07/14/2020    Lab Results  Component Value Date   ALT 6 07/13/2020   AST 14 (L) 07/13/2020   ALKPHOS 128 (H) 07/13/2020   BILITOT 1.3 (H) 07/13/2020      Microbiology: BCx 3/02 >> + MRSA 4/4 bottles  COVID/Flu >> negative    Janene Madeira, MSN, NP-C [facility] for Infectious Disease [email or web address] Pager: [phone] Office: [phone] [place]: [phone]

## 2020-07-14 NOTE — Progress Notes (Signed)
K+ 3.4, Mg 1.2, Crt 0.54 Replaced per protocol

## 2020-07-15 ENCOUNTER — Inpatient Hospital Stay (HOSPITAL_COMMUNITY): Payer: Medicaid Other

## 2020-07-15 DIAGNOSIS — I50811 Acute right heart failure: Secondary | ICD-10-CM | POA: Diagnosis not present

## 2020-07-15 DIAGNOSIS — O99891 Other specified diseases and conditions complicating pregnancy: Secondary | ICD-10-CM | POA: Diagnosis not present

## 2020-07-15 DIAGNOSIS — Z3A25 25 weeks gestation of pregnancy: Secondary | ICD-10-CM

## 2020-07-15 DIAGNOSIS — R7881 Bacteremia: Secondary | ICD-10-CM | POA: Diagnosis not present

## 2020-07-15 DIAGNOSIS — O99412 Diseases of the circulatory system complicating pregnancy, second trimester: Secondary | ICD-10-CM | POA: Diagnosis not present

## 2020-07-15 DIAGNOSIS — I76 Septic arterial embolism: Secondary | ICD-10-CM

## 2020-07-15 DIAGNOSIS — B9562 Methicillin resistant Staphylococcus aureus infection as the cause of diseases classified elsewhere: Secondary | ICD-10-CM | POA: Diagnosis not present

## 2020-07-15 DIAGNOSIS — K029 Dental caries, unspecified: Secondary | ICD-10-CM | POA: Diagnosis not present

## 2020-07-15 DIAGNOSIS — A539 Syphilis, unspecified: Secondary | ICD-10-CM | POA: Diagnosis present

## 2020-07-15 LAB — URINE CULTURE: Culture: NO GROWTH

## 2020-07-15 LAB — VANCOMYCIN, TROUGH: Vancomycin Tr: 13 ug/mL — ABNORMAL LOW (ref 15–20)

## 2020-07-15 LAB — BASIC METABOLIC PANEL
Anion gap: 10 (ref 5–15)
BUN: 9 mg/dL (ref 6–20)
CO2: 26 mmol/L (ref 22–32)
Calcium: 8.4 mg/dL — ABNORMAL LOW (ref 8.9–10.3)
Chloride: 96 mmol/L — ABNORMAL LOW (ref 98–111)
Creatinine, Ser: 0.5 mg/dL (ref 0.44–1.00)
GFR, Estimated: >60 mL/min (ref >60)
Glucose, Bld: 88 mg/dL (ref 70–99)
Potassium: 3.9 mmol/L (ref 3.5–5.1)
Sodium: 132 mmol/L — ABNORMAL LOW (ref 135–145)

## 2020-07-15 LAB — RPR
RPR Ser Ql: REACTIVE — AB
RPR Titer: 1:32 {titer}

## 2020-07-15 LAB — HEPATITIS B SURFACE ANTIGEN: Hepatitis B Surface Ag: NONREACTIVE

## 2020-07-15 LAB — MAGNESIUM: Magnesium: 1.6 mg/dL — ABNORMAL LOW (ref 1.7–2.4)

## 2020-07-15 LAB — PHOSPHORUS: Phosphorus: 2.4 mg/dL — ABNORMAL LOW (ref 2.5–4.6)

## 2020-07-15 MED ORDER — PENICILLIN G BENZATHINE 1200000 UNIT/2ML IM SUSP
2.4000 10*6.[IU] | Freq: Once | INTRAMUSCULAR | Status: AC
  Administered 2020-07-15: 2.4 10*6.[IU] via INTRAMUSCULAR
  Filled 2020-07-15: qty 4

## 2020-07-15 MED ORDER — METHADONE HCL 10 MG PO TABS
20.0000 mg | ORAL_TABLET | ORAL | Status: AC
  Administered 2020-07-15: 20 mg via ORAL
  Filled 2020-07-15: qty 2

## 2020-07-15 MED ORDER — METHADONE HCL 10 MG/ML PO CONC: 20.0000 mg | Freq: Once | ORAL | Status: DC

## 2020-07-15 MED ORDER — VANCOMYCIN HCL 1.25 G IV SOLR
1250.0000 mg | Freq: Three times a day (TID) | INTRAVENOUS | Status: DC
  Administered 2020-07-16 – 2020-07-17 (×6): 1250 mg via INTRAVENOUS
  Filled 2020-07-15 (×3): qty 1250
  Filled 2020-07-15: qty 1000
  Filled 2020-07-15 (×4): qty 1250
  Filled 2020-07-15: qty 250

## 2020-07-15 MED ORDER — COMPLETENATE 29-1 MG PO CHEW
1.0000 | CHEWABLE_TABLET | Freq: Every day | ORAL | Status: DC
  Administered 2020-07-15 – 2020-08-25 (×41): 1 via ORAL
  Filled 2020-07-15 (×42): qty 1

## 2020-07-15 MED ORDER — MAGNESIUM SULFATE 4 GM/100ML IV SOLN
4.0000 g | Freq: Once | INTRAVENOUS | Status: AC
  Administered 2020-07-15: 4 g via INTRAVENOUS
  Filled 2020-07-15: qty 100

## 2020-07-15 NOTE — Progress Notes (Addendum)
[facility]  Date of Admission:  07/13/2020      Total days of antibiotics 2  Vancomycin   Cefepime            ASSESSMENT: Kelly [name] is a 35 y.o. female with MRSA bacteremia in the setting of relapsing IV drug use. 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 Continue Vancomycin alone to treat her likely new bacterial endocarditis. Follow SCr.   Agree with Dr. Haroldine Laws - no need for TEE given it will not change current strategy for management. We know her valve is poorly functioning and likely infected again. Outside of the fact she relapsed she is currently pregnant and not a CT candidate.  Will need drug rehab program and outpatient follow up for consideration of surgical management should that be required after her infection is treated. Dr. Servando Snare saw her in 2021.   R lateral foot pain with cellulitis  - check Xray for now with history of possible trauma; ?fracture that would be unrelated to current infection.   Pregnancy gestation ~26 weeks dating by ultrasound, no prenatal care - HIV non reactive. Hep B sAg negative. Known history of hepatitis C exposure with positive Ab in 2020, awaiting confirmation RNA and genotype. Will need to defer treatment until after pregnancy/breastfeeding.  RPR newly reactive - awaiting confirmation as below.   Rash appears better. I was thinking that it was more mottled skin/swelling given significant improvement from admission, however RPR is newly reactive with titer and treponemal confirmation pending. Could be a false positive due to her pregnancy, her risk for syphilis is considerably high from what I understand with sexual history.  Further treatment recommendations to follow. Will need chlamydia screen with vaginal swab or voided urine at some point.   Hep C Ab (+) in 2020 = hep B sAg negative. Hep c RNA to confirm chronic infection pending.       PLAN: 1. Continue vancomycin  2. Follow repeated blood cultures  3. Xray of R foot  4. Hold on PICC line if condition permits  5. Follow pending syphilis confirmation test  6. Follow hep c RNA  7. Needs chlamydia screen (will do later once catheter removed to obtain voided urine)   Dr. Linus Salmons is available for questions over the weekend if needed.    Principal Problem:   MRSA bacteremia Active Problems:   Community acquired pneumonia   Tricuspid regurgitation   Opioid use disorder, severe, dependence ([place])   No prenatal care in current pregnancy in third trimester   History of bacterial endocarditis   Dental caries   Acute CHF (congestive heart failure) (HCC)   Normal IUP (intrauterine pregnancy) on prenatal ultrasound, third trimester   [redacted] weeks gestation of pregnancy   Cellulitis of right lower extremity   . sodium chloride   Intravenous Once  . acetaminophen  650 mg Oral Q6H  . Chlorhexidine Gluconate Cloth  6 each Topical Q0600  . enoxaparin (LOVENOX) injection  40 mg Subcutaneous Q24H  . methadone  20 mg Oral Q8H  . mupirocin ointment  1 application Nasal BID  . prenatal vitamin w/FE, FA  1 tablet Oral Q1200    SUBJECTIVE: Feeling a little better today. Asking about her methadone dose.  Still with R lateral foot pain and swelling. She does report she may have injured it prior to admission.     Review of Systems: Review of  Systems  Constitutional: Positive for chills and fever.  Respiratory: Positive for cough and shortness of breath.   Cardiovascular: Positive for chest pain and leg swelling.  Gastrointestinal: Positive for abdominal pain. Negative for diarrhea, nausea and vomiting.  Genitourinary: Dysuria: catheter discomfort.  Musculoskeletal: Negative for back pain.  Skin: Negative for itching and rash.  Neurological: Negative for dizziness, tremors, weakness and headaches.    Allergies  Allergen Reactions  . Hydrocodone Itching  . Morphine And  Related Nausea And Vomiting    OBJECTIVE: Vitals:   07/15/20 0100 07/15/20 0413 07/15/20 0500 07/15/20 0830  BP: 120/80 126/82  122/72  Pulse: (!) 106 94  99  Resp: 20 20  20   Temp: 98.6 F (37 C) 97.8 F (36.6 C)  97.9 F (36.6 C)  TempSrc:  Oral  Oral  SpO2: 95% 97%  95%  Weight:   90.9 kg   Height:       Body mass index is 30.92 kg/m.  Physical Exam Vitals reviewed.  Constitutional:      Appearance: She is well-developed. She is ill-appearing.  HENT:     Mouth/Throat:     Mouth: Mucous membranes are moist.     Pharynx: Oropharynx is clear.  Cardiovascular:     Rate and Rhythm: Regular rhythm. Tachycardia present.     Heart sounds: Murmur heard.    Pulmonary:     Effort: Pulmonary effort is normal. No respiratory distress.     Breath sounds: Rhonchi (coughing frequently) present.  Abdominal:     General: Bowel sounds are normal.     Palpations: Abdomen is soft.     Comments: Pregnancy noted  Skin:    General: Skin is warm.     Findings: Rash (multiple old scars scattered along legs. Few lipoms appreciated under the skin to thighs. Hands / feet without any rashes. ) present.  Neurological:     General: No focal deficit present.     Lab Results Lab Results  Component Value Date   WBC 20.4 (H) 07/14/2020   HGB 8.4 (L) 07/14/2020   HCT 24.4 (L) 07/14/2020   MCV 90.4 07/14/2020   PLT 238 07/14/2020    Lab Results  Component Value Date   CREATININE 0.50 07/15/2020   BUN 9 07/15/2020   NA 132 (L) 07/15/2020   K 3.9 07/15/2020   CL 96 (L) 07/15/2020   CO2 26 07/15/2020    Lab Results  Component Value Date   ALT 6 07/13/2020   AST 14 (L) 07/13/2020   ALKPHOS 128 (H) 07/13/2020   BILITOT 1.3 (H) 07/13/2020      Microbiology: BCx 3/02 >> + MRSA 4/4 bottles  COVID/Flu >> negative    Janene Madeira, MSN, NP-C [facility] [email or web address] Pager: [phone] Office:  [phone] [place]: [phone]

## 2020-07-15 NOTE — Progress Notes (Signed)
Fetal Heart Tones 147 bpm.  Dr Nelda Marseille just leaving room and says pt has no obstetrical complaints.

## 2020-07-15 NOTE — Progress Notes (Addendum)
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 -Current active IV heroin user uses about 3 g daily and IV fentanyl, presented to the ED with 3 to 4-week history of bilateral lower extremity edema associated with worsening dyspnea on exertion for 4 days, intermittent fevers for several weeks and pain with deep inspiration across her right lateral chest and flank. -In the ED he was noted to be fluid overloaded in addition blood cultures were positive for MRSA and she was started on IV vancomycin   Assessment & Plan:  MRSA bacteremia Presumed endocarditis -Continue IV vancomycin, appreciate infectious disease input -2D echocardiogram without obvious vegetation, unclear if TEE is needed at this point, versus empiric treatment of endocarditis -Previously treated for Enterococcal tricuspid valve endocarditis in 2020 -Will repeat blood cultures   Acute on chronic Right heart failure Severe TR Severe pulmonary hypertension -2D echo noted decreased RV systolic function, elevated PA pressures 92, severe TR -Volume status improving -Heart failure team following, got  IV Lasix yesterday -She is -2.2 L  -Check CMP in a.m.  IV heroin and fentanyl abuse Chronic pain -Reports that she uses 3 g of IV heroin daily and additional fentanyl -Currently on methadone 20 mg 3 times daily, QTC was prolonged on 3/2 will repeat EKG  26-week pregnancy -OB service following, getting daily fetal heart assessment -Prenatal  Pneumonia Possible septic pulmonary infarcts -Continue IV vancomycin -Repeat chest x-ray in 2 to 3 days   DVT  prophylaxis: Lovenox Code Status: Full code Family Communication: Patient denies having any family contacts    Consultants:  Cardiology, ID  Procedures:   Antimicrobials: IV Vanc   Subjective: -Complains of pain everywhere, right lateral chest wall hurts especially with coughing  Objective: Vitals:   07/15/20 0100 07/15/20 0413 07/15/20 0500 07/15/20 0830  BP: 120/80 126/82  122/72  Pulse: (!) 106 94  99  Resp: 20 20  20   Temp: 98.6 F (37 C) 97.8 F (36.6 C)  97.9 F (36.6 C)  TempSrc:  Oral  Oral  SpO2: 95% 97%  95%  Weight:   90.9 kg   Height:        Intake/Output Summary (Last 24 hours) at 07/15/2020 1004 Last data filed at 07/15/2020 0831 Gross per 24 hour  Intake 495.41 ml  Output 2231 ml  Net -1735.59 ml   Filed Weights   07/13/20 1824 07/14/20 0500 07/15/20 0500  Weight: 90.9 kg 90.7 kg 90.9 kg    Examination:  General exam: Young female, laying in bed, appears much older than stated age oriented x3, uncomfortable appearing  CVS: S1-S2, regular rate rhythm, systolic murmur noted Lungs: Decreased breath sounds at the bases, scattered rhonchi Abdomen: Soft, gravid uterus noted, bowel sounds present Extremities: Trace edema, multiple scars   Data Reviewed:   CBC: Recent Labs  Lab 07/13/20 1035 07/13/20 2119 07/14/20 0021 07/14/20 0157  WBC 19.1*  --   --  20.4*  HGB 9.4* 6.8* 8.4* 8.4*  HCT 28.5* 20.0* 25.3* 24.4*  MCV 93.8  --   --  90.4  PLT 322  --   --  027   Basic Metabolic Panel: Recent Labs  Lab 07/13/20 1035 07/13/20  2119 07/14/20 0157 07/14/20 1947 07/15/20 0416  NA 129* 130* 129*  --  132*  K 3.2* 3.0* 3.4*  --  3.9  CL 94*  --  96*  --  96*  CO2 21*  --  19*  --  26  GLUCOSE 138*  --  103*  --  88  BUN 11  --  7  --  9  CREATININE 0.74  --  0.54  --  0.50  CALCIUM 8.6*  --  8.2*  --  8.4*  MG  --   --  1.2* 1.8 1.6*  PHOS  --   --  3.2  --  2.4*   GFR: Estimated Creatinine Clearance: 114.7 mL/min (by C-G formula based  on SCr of 0.5 mg/dL). Liver Function Tests: Recent Labs  Lab 07/13/20 1035 07/13/20 2110  AST 18 14*  ALT 7 6  ALKPHOS 138* 128*  BILITOT 1.1 1.3*  PROT 7.1 6.3*  ALBUMIN 2.3* 2.0*   Recent Labs  Lab 07/13/20 1035  LIPASE 19   No results for input(s): AMMONIA in the last 168 hours. Coagulation Profile: No results for input(s): INR, PROTIME in the last 168 hours. Cardiac Enzymes: Recent Labs  Lab 07/13/20 1219  CKTOTAL 68   BNP (last 3 results) No results for input(s): PROBNP in the last 8760 hours. HbA1C: Recent Labs    07/13/20 1952  HGBA1C 4.6*   CBG: Recent Labs  Lab 07/13/20 2039  GLUCAP 108*   Lipid Profile: No results for input(s): CHOL, HDL, LDLCALC, TRIG, CHOLHDL, LDLDIRECT in the last 72 hours. Thyroid Function Tests: No results for input(s): TSH, T4TOTAL, FREET4, T3FREE, THYROIDAB in the last 72 hours. Anemia Panel: No results for input(s): VITAMINB12, FOLATE, FERRITIN, TIBC, IRON, RETICCTPCT in the last 72 hours. Urine analysis:    Component Value Date/Time   COLORURINE AMBER (A) 07/13/2020 1320   APPEARANCEUR CLOUDY (A) 07/13/2020 1320   APPEARANCEUR Hazy 07/10/2013 1121   LABSPEC 1.028 07/13/2020 1320   LABSPEC 1.020 07/10/2013 1121   PHURINE 5.0 07/13/2020 1320   GLUCOSEU NEGATIVE 07/13/2020 1320   GLUCOSEU Negative 07/10/2013 1121   HGBUR SMALL (A) 07/13/2020 1320   BILIRUBINUR NEGATIVE 07/13/2020 1320   BILIRUBINUR Negative 07/10/2013 1121   KETONESUR 5 (A) 07/13/2020 1320   PROTEINUR 100 (A) 07/13/2020 1320   UROBILINOGEN 1.0 02/15/2015 0530   NITRITE POSITIVE (A) 07/13/2020 1320   LEUKOCYTESUR LARGE (A) 07/13/2020 1320   LEUKOCYTESUR Trace 07/10/2013 1121   Sepsis Labs: @LABRCNTIP (procalcitonin:4,lacticidven:4)  ) Recent Results (from the past 240 hour(s))  Resp Panel by RT-PCR (Flu A&B, Covid) Nasopharyngeal Swab     Status: None   Collection Time: 07/13/20 11:21 AM   Specimen: Nasopharyngeal Swab; Nasopharyngeal(NP) swabs  in vial transport medium  Result Value Ref Range Status   SARS Coronavirus 2 by RT PCR NEGATIVE NEGATIVE Final    Comment: (NOTE) SARS-CoV-2 target nucleic acids are NOT DETECTED.  The SARS-CoV-2 RNA is generally detectable in upper respiratory specimens during the acute phase of infection. The lowest concentration of SARS-CoV-2 viral copies this assay can detect is 138 copies/mL. A negative result does not preclude SARS-Cov-2 infection and should not be used as the sole basis for treatment or other patient management decisions. A negative result may occur with  improper specimen collection/handling, submission of specimen other than nasopharyngeal swab, presence of viral mutation(s) within the areas targeted by this assay, and inadequate number of viral copies(<138 copies/mL). A negative result must be combined with clinical  observations, patient history, and epidemiological information. The expected result is Negative.  Fact Sheet for Patients:  EntrepreneurPulse.com.au  Fact Sheet for Healthcare Providers:  IncredibleEmployment.be  This test is no t yet approved or cleared by the [place] FDA and  has been authorized for detection and/or diagnosis of SARS-CoV-2 by FDA under an Emergency Use Authorization (EUA). This EUA will remain  in effect (meaning this test can be used) for the duration of the COVID-19 declaration under Section 564(b)(1) of the Act, 21 U.S.C.section 360bbb-3(b)(1), unless the authorization is terminated  or revoked sooner.       Influenza A by PCR NEGATIVE NEGATIVE Final   Influenza B by PCR NEGATIVE NEGATIVE Final    Comment: (NOTE) The Xpert Xpress SARS-CoV-2/FLU/RSV plus assay is intended as an aid in the diagnosis of influenza from Nasopharyngeal swab specimens and should not be used as a sole basis for treatment. Nasal washings and aspirates are unacceptable for Xpert Xpress  SARS-CoV-2/FLU/RSV testing.  Fact Sheet for Patients: EntrepreneurPulse.com.au  Fact Sheet for Healthcare Providers: IncredibleEmployment.be  This test is not yet approved or cleared by the [place] FDA and has been authorized for detection and/or diagnosis of SARS-CoV-2 by FDA under an Emergency Use Authorization (EUA). This EUA will remain in effect (meaning this test can be used) for the duration of the COVID-19 declaration under Section 564(b)(1) of the Act, 21 U.S.C. section 360bbb-3(b)(1), unless the authorization is terminated or revoked.  Performed at [facility], [place] [address], [place], [place] [address]   Culture, blood (routine x 2)     Status: Abnormal (Preliminary result)   Collection Time: 07/13/20 12:20 PM   Specimen: BLOOD  Result Value Ref Range Status   Specimen Description BLOOD SITE NOT SPECIFIED  Final   Special Requests   Final    BOTTLES DRAWN AEROBIC AND ANAEROBIC Blood Culture adequate volume   Culture  Setup Time   Final    GRAM POSITIVE COCCI IN CHAINS IN BOTH AEROBIC AND ANAEROBIC BOTTLES Organism ID to follow CRITICAL RESULT CALLED TO, READ BACK BY AND VERIFIED WITH:    Culture (A)  Final    STAPHYLOCOCCUS AUREUS SUSCEPTIBILITIES TO FOLLOW Performed at [facility], [place] [address] [place], [place] [address]    Report Status PENDING  Incomplete  Blood Culture ID Panel (Reflexed)     Status: Abnormal   Collection Time: 07/13/20 12:20 PM  Result Value Ref Range Status   Enterococcus faecalis NOT DETECTED NOT DETECTED Final   Enterococcus Faecium NOT DETECTED NOT DETECTED Final   Listeria monocytogenes NOT DETECTED NOT DETECTED Final   Staphylococcus species DETECTED (A) NOT DETECTED Final    Comment: CRITICAL RESULT CALLED TO, READ BACK BY AND VERIFIED WITH: PHARMD AMEND CAREN BY MESSAN H. AT 0120 ON 07/14/2020    Staphylococcus aureus (BCID) DETECTED (A) NOT DETECTED Final    Comment:  Methicillin (oxacillin)-resistant Staphylococcus aureus (MRSA). MRSA is predictably resistant to beta-lactam antibiotics (except ceftaroline). Preferred therapy is vancomycin unless clinically contraindicated. Patient requires contact precautions if  hospitalized. CRITICAL RESULT CALLED TO, READ BACK BY AND VERIFIED WITH: PHARMD AMEND CAREN BY MESSAN H. AT 0120 ON 07/14/2020    Staphylococcus epidermidis NOT DETECTED NOT DETECTED Final   Staphylococcus lugdunensis NOT DETECTED NOT DETECTED Final   Streptococcus species NOT DETECTED NOT DETECTED Final   Streptococcus agalactiae NOT DETECTED NOT DETECTED Final   Streptococcus pneumoniae NOT DETECTED NOT DETECTED Final   Streptococcus pyogenes NOT DETECTED NOT DETECTED  Final   A.calcoaceticus-baumannii NOT DETECTED NOT DETECTED Final   Bacteroides fragilis NOT DETECTED NOT DETECTED Final   Enterobacterales NOT DETECTED NOT DETECTED Final   Enterobacter cloacae complex NOT DETECTED NOT DETECTED Final   Escherichia coli NOT DETECTED NOT DETECTED Final   Klebsiella aerogenes NOT DETECTED NOT DETECTED Final   Klebsiella oxytoca NOT DETECTED NOT DETECTED Final   Klebsiella pneumoniae NOT DETECTED NOT DETECTED Final   Proteus species NOT DETECTED NOT DETECTED Final   Salmonella species NOT DETECTED NOT DETECTED Final   Serratia marcescens NOT DETECTED NOT DETECTED Final   Haemophilus influenzae NOT DETECTED NOT DETECTED Final   Neisseria meningitidis NOT DETECTED NOT DETECTED Final   Pseudomonas aeruginosa NOT DETECTED NOT DETECTED Final   Stenotrophomonas maltophilia NOT DETECTED NOT DETECTED Final   Candida albicans NOT DETECTED NOT DETECTED Final   Candida auris NOT DETECTED NOT DETECTED Final   Candida glabrata NOT DETECTED NOT DETECTED Final   Candida krusei NOT DETECTED NOT DETECTED Final   Candida parapsilosis NOT DETECTED NOT DETECTED Final   Candida tropicalis NOT DETECTED NOT DETECTED Final   Cryptococcus neoformans/gattii NOT DETECTED  NOT DETECTED Final   Meth resistant mecA/C and MREJ DETECTED (A) NOT DETECTED Final    Comment: CRITICAL RESULT CALLED TO, READ BACK BY AND VERIFIED WITH: PHARMD AMEND CAREN BY MESSAN H. AT 0120 ON 07/14/2020 Performed at [facility], [address] [place], [place] [address]   Culture, blood (routine x 2)     Status: Abnormal (Preliminary result)   Collection Time: 07/13/20 12:21 PM   Specimen: BLOOD  Result Value Ref Range Status   Specimen Description BLOOD SITE NOT SPECIFIED  Final   Special Requests   Final    BOTTLES DRAWN AEROBIC AND ANAEROBIC Blood Culture adequate volume   Culture  Setup Time   Final    GRAM POSITIVE COCCI IN BOTH AEROBIC AND ANAEROBIC BOTTLES CRITICAL VALUE NOTED.  VALUE IS CONSISTENT WITH PREVIOUSLY REPORTED AND CALLED VALUE. Performed at [facility], [place] [address] [place], [place] [address]    Culture STAPHYLOCOCCUS AUREUS (A)  Final   Report Status PENDING  Incomplete  MRSA PCR Screening     Status: Abnormal   Collection Time: 07/13/20  8:37 PM   Specimen: Nasal Mucosa; Nasopharyngeal  Result Value Ref Range Status   MRSA by PCR POSITIVE (A) NEGATIVE Final    Comment:        The GeneXpert MRSA Assay (FDA approved for NASAL specimens only), is one component of a comprehensive MRSA colonization surveillance program. It is not intended to diagnose MRSA infection nor to guide or monitor treatment for MRSA infections. RESULT CALLED TO, READ BACK BY AND VERIFIED WITH: TOLER,M RN 07/13/2020 AT 2319 SKEEN,P Performed at [facility], [place] [address], [place], [place] [address]   Urine culture     Status: None   Collection Time: 07/13/20 10:10 PM   Specimen: Urine, Clean Catch  Result Value Ref Range Status   Specimen Description URINE, CLEAN CATCH  Final   Special Requests NONE  Final   Culture   Final    NO GROWTH Performed at [facility], [address] [place], [place] [address]    Report Status 07/15/2020 FINAL   Final         Radiology Studies: DG Chest 2 View  Result Date: 07/13/2020 CLINICAL DATA:  Shortness of breath EXAM: CHEST - 2 VIEW COMPARISON:  07/26/2018 FINDINGS: Low  lung volumes. Bibasilar airspace opacities. Mild cardiomegaly. No effusions. No acute bony abnormality. IMPRESSION: Low lung volumes with bibasilar airspace opacities concerning for pneumonia. Mild cardiomegaly. Electronically Signed   By: Rolm Baptise M.D.   On: 07/13/2020 11:33   DG Chest Port 1 View  Result Date: 07/15/2020 CLINICAL DATA:  Abnormal respiration EXAM: PORTABLE CHEST 1 VIEW COMPARISON:  07/13/2020 FINDINGS: Pulmonary insufflation has slightly improved and is symmetric. Superimposed multifocal pulmonary infiltrates, however, have progressed slightly in the interval, most in keeping with atypical infection or inflammation. No pneumothorax or pleural effusion. Cardiac size is at the upper limits of normal. Harrington rods again noted. IMPRESSION: Sided renal improvement in pulmonary insufflation. Progressive multifocal pulmonary infiltrates most in keeping with atypical infection or inflammation. Electronically Signed   By: Fidela Salisbury MD   On: 07/15/2020 06:03   ECHOCARDIOGRAM LIMITED  Result Date: 07/13/2020    ECHOCARDIOGRAM REPORT   Patient Name:   Kelly [name] Date of Exam: 07/13/2020 Medical Rec #:  [id number]          Height:       67.5 in Accession #:    [id number]         Weight:       177.0 lb Date of Birth:  May 10, 1986           BSA:          1.931 m Patient Age:    62 years           BP:           128/72 mmHg Patient Gender: F                  HR:           125 bpm. Exam Location:  Inpatient Procedure: Limited Color Doppler and Cardiac Doppler STAT ECHO Indications:     endocarditis  History:         Patient has prior history of Echocardiogram examinations, most                  recent 05/21/2019. Signs/Symptoms:Bacteremia, Dyspnea and Chest                  Pain; Risk Factors:IV drug use.  Sonographer:      Johny Chess Referring Phys:  [place] Diagnosing Phys: Loralie Champagne MD IMPRESSIONS  1. Left ventricular ejection fraction, by estimation, is 55%. The left ventricle has normal function. The left ventricle has no regional wall motion abnormalities. Left ventricular diastolic parameters are indeterminate.  2. Right ventricular systolic function is moderately reduced. The right ventricular size is severely enlarged. There is severely elevated pulmonary artery systolic pressure. The estimated right ventricular systolic pressure is 78.4 mmHg.  3. Right atrial size was severely dilated.  4. A small pericardial effusion is present.  5. The mitral valve is normal in structure. No evidence of mitral valve regurgitation. No evidence of mitral stenosis.  6. The tricuspid valve is abnormal. Tricuspid valve regurgitation is severe.  7. The aortic valve is tricuspid. Aortic valve regurgitation is not visualized. No aortic stenosis is present.  8. The inferior vena cava is dilated in size with <50% respiratory variability, suggesting right atrial pressure of 15 mmHg.  9. No definite endocarditis was visualized. However, the right heart was dilated and dysfunctional with severe pulmonary hypertension and severe TR. FINDINGS  Left Ventricle: Left ventricular ejection fraction, by estimation, is 55%. The left ventricle has normal function. The  left ventricle has no regional wall motion abnormalities. The left ventricular internal cavity size was normal in size. There is no left ventricular hypertrophy. Left ventricular diastolic parameters are indeterminate. Right Ventricle: The right ventricular size is severely enlarged. No increase in right ventricular wall thickness. Right ventricular systolic function is moderately reduced. There is severely elevated pulmonary artery systolic pressure. The tricuspid regurgitant velocity is 4.40 m/s, and with an assumed right atrial pressure of 15 mmHg, the estimated right  ventricular systolic pressure is 21.3 mmHg. Left Atrium: Left atrial size was normal in size. Right Atrium: Right atrial size was severely dilated. Pericardium: A small pericardial effusion is present. Mitral Valve: The mitral valve is normal in structure. No evidence of mitral valve regurgitation. No evidence of mitral valve stenosis. Tricuspid Valve: The tricuspid valve is abnormal. Tricuspid valve regurgitation is severe. Aortic Valve: The aortic valve is tricuspid. Aortic valve regurgitation is not visualized. No aortic stenosis is present. Pulmonic Valve: The pulmonic valve was normal in structure. Pulmonic valve regurgitation is not visualized. Aorta: The aortic root is normal in size and structure. Venous: The inferior vena cava is dilated in size with less than 50% respiratory variability, suggesting right atrial pressure of 15 mmHg. IAS/Shunts: No atrial level shunt detected by color flow Doppler.  IVC IVC diam: 2.20 cm RIGHT ATRIUM           Index RA Area:     32.10 cm RA Volume:   136.00 ml 70.45 ml/m  TRICUSPID VALVE TR Peak grad:   77.4 mmHg TR Vmax:        440.00 cm/s Loralie Champagne MD Electronically signed by Loralie Champagne MD Signature Date/Time: 07/13/2020/5:19:31 PM    Final (Updated)    [place] MFM OB COMP + 14 WK  Result Date: 07/13/2020 ----------------------------------------------------------------------  OBSTETRICS REPORT                       (Signed Final 07/13/2020 02:48 pm) ---------------------------------------------------------------------- Patient Info  ID #:       [id number]                          D.O.B.:  Mar 02, 1986 (34 yrs)  Name:       Kelly [name]              Visit Date: 07/13/2020 02:08 pm ---------------------------------------------------------------------- Performed By  Attending:        Sander Nephew      Ref. Address:     [facility]                    MD                                                             GYN  Performed By:     Valda Favia           [facility]  [place]  Referred By:      Annalee Genta DO ---------------------------------------------------------------------- Orders  #  Description                           Code        Ordered By  1  [place] MFM OB COMP + 14 WK                76805.01    Janyth Pupa ----------------------------------------------------------------------  #  Order #                     Accession #                Episode #  1  [id number]                   [id number]                 [id number] ---------------------------------------------------------------------- Indications  [redacted] weeks gestation of pregnancy                Z3A.26  Encounter for uncertain dates                  A19.37  Drug use complicating pregnancy, third         O65.323  trimester  Advanced maternal age multigravida 84+,        O17.523  third trimester  Previous cesarean delivery, antepartum x1      O34.219  Abdominal pain in pregnancy                    O99.89 ---------------------------------------------------------------------- Fetal Evaluation  Num Of Fetuses:         1  Fetal Heart Rate(bpm):  169  Cardiac Activity:       Observed  Presentation:           Breech  Placenta:               Anterior  P. Cord Insertion:      Not well visualized  Amniotic Fluid  AFI FV:      Within normal limits                              Largest Pocket(cm)                              5.6 ---------------------------------------------------------------------- Biometry  BPD:      64.8  mm     G. Age:  26w 1d         41  %    CI:        75.49   %    70 - 86                                                          FL/HC:      18.8   %    18.6 - 20.4  HC:      236.5  mm     G. Age:  25w 5d         14  %    HC/AC:      1.01        1.04 - 1.22  AC:      234.2  mm     G. Age:  27w 5d         86  %    FL/BPD:     68.7   %    71 - 87  FL:       44.5  mm     G. Age:  24w 5d          5  %     FL/AC:      19.0   %    20 - 24  HUM:      42.2  mm     G. Age:  25w 3d         27  %  Est. FW:     930  gm      2 lb 1 oz     49  % ---------------------------------------------------------------------- OB History  Gravidity:    7         Term:   3        Prem:   0        SAB:   3  TOP:          0       Ectopic:  0        Living: 3 ---------------------------------------------------------------------- Gestational Age  U/S Today:     26w 1d                                        EDD:   10/18/20  Best:          26w 1d     Det. By:  U/S (07/13/20)           EDD:   10/18/20 ---------------------------------------------------------------------- Anatomy  Cranium:               Appears normal         LVOT:                   Not well visualized  Cavum:                 Appears normal         Aortic Arch:            Not well visualized  Ventricles:            Not well visualized    Ductal Arch:            Not well visualized  Choroid Plexus:        Appears normal         Diaphragm:              Appears normal  Cerebellum:            Not well visualized    Stomach:                Appears normal, left  sided  Posterior Fossa:       Not well visualized    Abdomen:                Appears normal  Nuchal Fold:           Not applicable (>16    Abdominal Wall:         Not well visualized                         wks GA)  Face:                  Not well visualized    Cord Vessels:           Appears normal (3                                                                        vessel cord)  Lips:                  Not well visualized    Kidneys:                Not well visualized  Palate:                Not well visualized    Bladder:                Appears normal  Thoracic:              Appears normal         Spine:                  Not well visualized  Heart:                 Not well visualized    Upper Extremities:      Visualized  RVOT:                  Not  well visualized    Lower Extremities:      Visualized  Other:  Fetus appears to be a female. Technically difficult due to maternal          habitus and fetal position. ---------------------------------------------------------------------- Cervix Uterus Adnexa  Cervix  Not visualized (advanced GA >24wks)  Uterus  No abnormality visualized.  Right Ovary  No adnexal mass visualized.  Left Ovary  No adnexal mass visualized.  Cul De Sac  No free fluid seen.  Adnexa  No abnormality visualized. ---------------------------------------------------------------------- Impression  Single intrauterine pregnancy here for a detailed anatomy  due new onset abdominal pain and known substance abuse (  Heroine).  Normal anatomy with measurements consistent with today's  examination, Ms. Ramsay has an uncertain LMP.  There is good fetal movement and amniotic fluid volume  Suboptimal views of the fetal anatomy were obtained  secondary to fetal position and maternal ability to lay flat. ---------------------------------------------------------------------- Recommendations  Repeat growth and anatomy in 3 weeks. ----------------------------------------------------------------------               Sander Nephew, MD Electronically Signed Final Report   07/13/2020 02:48 pm ----------------------------------------------------------------------  US Abdomen Limited RUQ (LIVER/GB)  Result Date: 07/13/2020 CLINICAL DATA:  Abdomen  pain pregnant patient EXAM: ULTRASOUND ABDOMEN LIMITED RIGHT UPPER QUADRANT COMPARISON:  CT 01/15/2019 FINDINGS: Gallbladder: Surgically absent Common bile duct: Diameter: 3.4 mm Liver: No focal lesion identified. Within normal limits in parenchymal echogenicity. Portal vein is patent on color Doppler imaging with normal direction of blood flow towards the liver. Other: Mild right hydronephrosis. IMPRESSION: 1. Status post cholecystectomy. 2. Mild right hydronephrosis Electronically Signed   By: Donavan Foil M.D.    On: 07/13/2020 22:14   Scheduled Meds: . sodium chloride   Intravenous Once  . acetaminophen  650 mg Oral Q6H  . Chlorhexidine Gluconate Cloth  6 each Topical Q0600  . enoxaparin (LOVENOX) injection  40 mg Subcutaneous Q24H  . methadone  20 mg Oral Q8H  . mupirocin ointment  1 application Nasal BID   Continuous Infusions: . magnesium sulfate bolus IVPB 4 g (07/15/20 0958)  . vancomycin 1,000 mg (07/15/20 0422)     LOS: 2 days    Time spent: 52mn    PDomenic Polite MD [facility] 07/15/2020, 10:04 AM

## 2020-07-15 NOTE — Progress Notes (Signed)
FACULTY PRACTICE Progress Note  Kelly [name] is a 35 y.o. [phone] dated by 26wk scan currently 63w3dadmitted for bacteremia, endocarditis and heart failure with h/o polysubstance abuse, severe TR  Length of Stay:  2  Days  Date of admission:07/13/2020  Subjective:    Pt lying down resting.  Patient reports the fetal movement as active. Patient reports uterine contraction  activity as none. Patient reports  vaginal bleeding as none. Patient describes fluid per vagina as None.  Vitals:  Blood pressure 122/72, pulse 99, temperature 97.9 F (36.6 C), temperature source Oral, resp. rate 20, height 5' 7.5" (1.715 m), weight 90.9 kg, SpO2 95 %, not currently breastfeeding. Vitals:   07/15/20 0100 07/15/20 0413 07/15/20 0500 07/15/20 0830  BP: 120/80 126/82  122/72  Pulse: (!) 106 94  99  Resp: 20 20  20   Temp: 98.6 F (37 C) 97.8 F (36.6 C)  97.9 F (36.6 C)  TempSrc:  Oral  Oral  SpO2: 95% 97%  95%  Weight:   90.9 kg   Height:       Physical Examination:  General appearance - improved Mental status - uncooperative Abdomen - gravid, soft and non-tender Extremities - edema much improved FHT: 147 by doppler- performed by OB RN   A/P: [id number]@ [id number] for MRSA bacteremia, suspected endocarditis and cardiac failure with history of polysubstance abuse -Management per IM team as current hospitalization is not related to pregnancy -From an OB standpoint, fetal well being reassuring- will continue with daily dopplers -[facility] to [facility] cardiac history [facility]  JeAnnalee Genta/08/2020,12:00 PM

## 2020-07-15 NOTE — Progress Notes (Signed)
Advanced Heart Failure Rounding Note   Subjective:    Continues to stabilize. Still with pain.   Good urine output with one dose IV lasix yesterday but I/Os even. Weight unchanged.   Remains on IV abx.  RPR screen +    Objective:   Weight Range:  Vital Signs:   Temp:  [97.7 F (36.5 C)-98.6 F (37 C)] 97.9 F (36.6 C) (03/04 0830) Pulse Rate:  [83-106] 99 (03/04 0830) Resp:  [16-29] 20 (03/04 0830) BP: (110-128)/(72-91) 122/72 (03/04 0830) SpO2:  [94 %-99 %] 95 % (03/04 0830) Weight:  [90.9 kg] 90.9 kg (03/04 0500) Last BM Date:  (pta)  Weight change: Filed Weights   07/13/20 1824 07/14/20 0500 07/15/20 0500  Weight: 90.9 kg 90.7 kg 90.9 kg    Intake/Output:   Intake/Output Summary (Last 24 hours) at 07/15/2020 1431 Last data filed at 07/15/2020 0831 Gross per 24 hour  Intake 246.07 ml  Output 715 ml  Net -468.93 ml     Physical Exam: General:  Sitting up in bed  No resp difficulty HEENT: normal Neck: supple. JVP to jaw. Carotids 2+ bilat; no bruits. No lymphadenopathy or thryomegaly appreciated. Cor: PMI nondisplaced. Regular rate & rhythm. 2/6 TR Lungs: clear Abdomen: soft, nontender, +gravid  No hepatosplenomegaly. No bruits or masses. Good bowel sounds. Extremities: no cyanosis, clubbing, rash, 1-2+ edema + rash Neuro: alert & orientedx3, cranial nerves grossly intact. moves all 4 extremities w/o difficulty. Affect pleasant  Telemetry: Sinus 90-100 Personally reviewed   Labs: Basic Metabolic Panel: Recent Labs  Lab 07/13/20 1035 07/13/20 2119 07/14/20 0157 07/14/20 1947 07/15/20 0416  NA 129* 130* 129*  --  132*  K 3.2* 3.0* 3.4*  --  3.9  CL 94*  --  96*  --  96*  CO2 21*  --  19*  --  26  GLUCOSE 138*  --  103*  --  88  BUN 11  --  7  --  9  CREATININE 0.74  --  0.54  --  0.50  CALCIUM 8.6*  --  8.2*  --  8.4*  MG  --   --  1.2* 1.8 1.6*  PHOS  --   --  3.2  --  2.4*    Liver Function Tests: Recent Labs  Lab 07/13/20 1035  07/13/20 2110  AST 18 14*  ALT 7 6  ALKPHOS 138* 128*  BILITOT 1.1 1.3*  PROT 7.1 6.3*  ALBUMIN 2.3* 2.0*   Recent Labs  Lab 07/13/20 1035  LIPASE 19   No results for input(s): AMMONIA in the last 168 hours.  CBC: Recent Labs  Lab 07/13/20 1035 07/13/20 2119 07/14/20 0021 07/14/20 0157  WBC 19.1*  --   --  20.4*  HGB 9.4* 6.8* 8.4* 8.4*  HCT 28.5* 20.0* 25.3* 24.4*  MCV 93.8  --   --  90.4  PLT 322  --   --  238    Cardiac Enzymes: Recent Labs  Lab 07/13/20 1219  CKTOTAL 68    BNP: BNP (last 3 results) Recent Labs    07/13/20 1219  BNP 157.1*    ProBNP (last 3 results) No results for input(s): PROBNP in the last 8760 hours.    Other results:  Imaging: DG Chest Port 1 View  Result Date: 07/15/2020 CLINICAL DATA:  Abnormal respiration EXAM: PORTABLE CHEST 1 VIEW COMPARISON:  07/13/2020 FINDINGS: Pulmonary insufflation has slightly improved and is symmetric. Superimposed multifocal pulmonary infiltrates, however, have progressed slightly  in the interval, most in keeping with atypical infection or inflammation. No pneumothorax or pleural effusion. Cardiac size is at the upper limits of normal. Harrington rods again noted. IMPRESSION: Sided renal improvement in pulmonary insufflation. Progressive multifocal pulmonary infiltrates most in keeping with atypical infection or inflammation. Electronically Signed   By: Fidela Salisbury MD   On: 07/15/2020 06:03   ECHOCARDIOGRAM LIMITED  Result Date: 07/13/2020    ECHOCARDIOGRAM REPORT   Patient Name:   Kelly [name] Date of Exam: 07/13/2020 Medical Rec #:  [id number]          Height:       67.5 in Accession #:    [id number]         Weight:       177.0 lb Date of Birth:  Dec 06, 1985           BSA:          1.931 m Patient Age:    35 years           BP:           128/72 mmHg Patient Gender: F                  HR:           125 bpm. Exam Location:  Inpatient Procedure: Limited Color Doppler and Cardiac Doppler STAT ECHO  Indications:     endocarditis  History:         Patient has prior history of Echocardiogram examinations, most                  recent 05/21/2019. Signs/Symptoms:Bacteremia, Dyspnea and Chest                  Pain; Risk Factors:IV drug use.  Sonographer:     Johny Chess Referring Phys:  [place] Diagnosing Phys: Loralie Champagne MD IMPRESSIONS  1. Left ventricular ejection fraction, by estimation, is 55%. The left ventricle has normal function. The left ventricle has no regional wall motion abnormalities. Left ventricular diastolic parameters are indeterminate.  2. Right ventricular systolic function is moderately reduced. The right ventricular size is severely enlarged. There is severely elevated pulmonary artery systolic pressure. The estimated right ventricular systolic pressure is 40.1 mmHg.  3. Right atrial size was severely dilated.  4. A small pericardial effusion is present.  5. The mitral valve is normal in structure. No evidence of mitral valve regurgitation. No evidence of mitral stenosis.  6. The tricuspid valve is abnormal. Tricuspid valve regurgitation is severe.  7. The aortic valve is tricuspid. Aortic valve regurgitation is not visualized. No aortic stenosis is present.  8. The inferior vena cava is dilated in size with <50% respiratory variability, suggesting right atrial pressure of 15 mmHg.  9. No definite endocarditis was visualized. However, the right heart was dilated and dysfunctional with severe pulmonary hypertension and severe TR. FINDINGS  Left Ventricle: Left ventricular ejection fraction, by estimation, is 55%. The left ventricle has normal function. The left ventricle has no regional wall motion abnormalities. The left ventricular internal cavity size was normal in size. There is no left ventricular hypertrophy. Left ventricular diastolic parameters are indeterminate. Right Ventricle: The right ventricular size is severely enlarged. No increase in right ventricular wall  thickness. Right ventricular systolic function is moderately reduced. There is severely elevated pulmonary artery systolic pressure. The tricuspid regurgitant velocity is 4.40 m/s, and with an assumed right atrial pressure of 15 mmHg, the estimated  right ventricular systolic pressure is 12.4 mmHg. Left Atrium: Left atrial size was normal in size. Right Atrium: Right atrial size was severely dilated. Pericardium: A small pericardial effusion is present. Mitral Valve: The mitral valve is normal in structure. No evidence of mitral valve regurgitation. No evidence of mitral valve stenosis. Tricuspid Valve: The tricuspid valve is abnormal. Tricuspid valve regurgitation is severe. Aortic Valve: The aortic valve is tricuspid. Aortic valve regurgitation is not visualized. No aortic stenosis is present. Pulmonic Valve: The pulmonic valve was normal in structure. Pulmonic valve regurgitation is not visualized. Aorta: The aortic root is normal in size and structure. Venous: The inferior vena cava is dilated in size with less than 50% respiratory variability, suggesting right atrial pressure of 15 mmHg. IAS/Shunts: No atrial level shunt detected by color flow Doppler.  IVC IVC diam: 2.20 cm RIGHT ATRIUM           Index RA Area:     32.10 cm RA Volume:   136.00 ml 70.45 ml/m  TRICUSPID VALVE TR Peak grad:   77.4 mmHg TR Vmax:        440.00 cm/s Loralie Champagne MD Electronically signed by Loralie Champagne MD Signature Date/Time: 07/13/2020/5:19:31 PM    Final (Updated)    US Abdomen Limited RUQ (LIVER/GB)  Result Date: 07/13/2020 CLINICAL DATA:  Abdomen pain pregnant patient EXAM: ULTRASOUND ABDOMEN LIMITED RIGHT UPPER QUADRANT COMPARISON:  CT 01/15/2019 FINDINGS: Gallbladder: Surgically absent Common bile duct: Diameter: 3.4 mm Liver: No focal lesion identified. Within normal limits in parenchymal echogenicity. Portal vein is patent on color Doppler imaging with normal direction of blood flow towards the liver. Other: Mild right  hydronephrosis. IMPRESSION: 1. Status post cholecystectomy. 2. Mild right hydronephrosis Electronically Signed   By: Donavan Foil M.D.   On: 07/13/2020 22:14     Medications:     Scheduled Medications: . sodium chloride   Intravenous Once  . acetaminophen  650 mg Oral Q6H  . Chlorhexidine Gluconate Cloth  6 each Topical Q0600  . enoxaparin (LOVENOX) injection  40 mg Subcutaneous Q24H  . methadone  20 mg Oral Q8H  . mupirocin ointment  1 application Nasal BID  . prenatal vitamin w/FE, FA  1 tablet Oral Q1200    Infusions: . vancomycin 1,000 mg (07/15/20 1248)    PRN Medications: docusate sodium, HYDROmorphone (DILAUDID) injection, polyethylene glycol, prochlorperazine, sodium chloride flush   Assessment/Plan:   1. MRSA Sepsis  - bcx 2/2 + for MRSA - clinically improved with IV abx and volume resuscitation.  - ID has seen and adjusting abx. Will need 6 weeks.  - no obvious vegetation on valve on TTE but assume she has recurrent endocarditis and would treat for 6 weeks with IV abx. D/w ID and both agree that TEE would not change management at this point as valve known to be heavily damaged from previous episodes and currently not candidate for operative repair.   2. Acute on Chronic Systolic CHF with Mostly Right Sided CHF (Likely Secondary to Severe TR) - Echo 07/13/20 showed normalization of LV function  LVEF of 55% with normal wall motion. RV severely enlarged with moderately reduced systolic function and severely elevated PASP of 92.28mHg. Severe TR noted but no vegetation visualized.  - Remains volume overloaded - Will give 40 IV lasix bid  3. 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 He was seen by Dr. GServando Snarein  05/2019 for possible surgical repair. However, patient was lost to follow-up and unfortunately started using drugs again. - Echo this admission shows  severe TR but no signs of vegetation. - Plan as above  4. 73w3dpregnant - u/s ok - OB following - our local high-risk OB team would be happy to manage pregnancy here from my standpoint. Will d/w rest of team  5. RPR + - ID sent confirmatory test  I called and spoke with her mom MLesleigh [phone] updated her. She had not talked to her daughter for > 1 year and said she tracks a prostitution website to see if her daughter is signed in to make sure she is still alive.     Length of Stay: 2   DGlori BickersMD 07/15/2020, 2:31 PM  Advanced Heart Failure Team Pager [phone](M-F; 7a - 4p)  Please contact [facility] for night-coverage after hours (4p -7a ) and weekends on [email or web address]

## 2020-07-15 NOTE — Progress Notes (Signed)
Pharmacy Antibiotic Note  Kelly [name] is a 35 y.o. female with MRSA bacteremia  And TV endocarditis. 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  Pharmacy has been consulted for vancomycin. Patient is pregnant with EGA of [redacted] weeks. -2 failed attempts at trying to get a vancomycin peak -vancomycin trough= 13 at ~ 7pm. Last dose given ~ 1pm    Plan: Change vancomycin to 1232m IV q8h. Cefepime 2g IV every 8 hours Monitor C&S, clinical status, renal function, VT as needed  Temp (24hrs), Avg:98 F (36.7 C), Min:97.6 F (36.4 C), Max:98.6 F (37 C)  Recent Labs  Lab 07/13/20 1035 07/13/20 1219 07/13/20 1530 07/13/20 2110 07/14/20 0021 07/14/20 0157 07/15/20 0416 07/15/20 1849  WBC 19.1*  --   --   --   --  20.4*  --   --   CREATININE 0.74  --   --   --   --  0.54 0.50  --   LATICACIDVEN  --  3.2* 2.0* 1.4 1.5  --   --   --   VANCOTROUGH  --   --   --   --   --   --   --  13*    Estimated Creatinine Clearance: 114.7 mL/min (by C-G formula based on SCr of 0.5 mg/dL).    Allergies  Allergen Reactions  . Hydrocodone Itching  . Morphine And Related Nausea And Vomiting    Antimicrobials this admission: Azith 3/2 x1 CTX 3/2 x1 Vancomycin 3/2> Cefepime 3/2> 3/3  Thank you for allowing pharmacy to be a part of this patient's care.  AHildred Laser PharmD Clinical Pharmacist **Pharmacist phone directory can now be found on [facility] ([email or web address]).  Listed under [place]

## 2020-07-16 DIAGNOSIS — R7881 Bacteremia: Secondary | ICD-10-CM | POA: Diagnosis not present

## 2020-07-16 DIAGNOSIS — O0932 Supervision of pregnancy with insufficient antenatal care, second trimester: Secondary | ICD-10-CM | POA: Diagnosis not present

## 2020-07-16 DIAGNOSIS — I079 Rheumatic tricuspid valve disease, unspecified: Secondary | ICD-10-CM | POA: Diagnosis not present

## 2020-07-16 DIAGNOSIS — O0930 Supervision of pregnancy with insufficient antenatal care, unspecified trimester: Secondary | ICD-10-CM

## 2020-07-16 DIAGNOSIS — B9562 Methicillin resistant Staphylococcus aureus infection as the cause of diseases classified elsewhere: Secondary | ICD-10-CM | POA: Diagnosis not present

## 2020-07-16 LAB — HCV RNA QUANT RFLX ULTRA OR GENOTYP
HCV RNA Qnt(log copy/mL): UNDETERMINED log10 IU/mL
HepC Qn: NOT DETECTED IU/mL

## 2020-07-16 LAB — COMPREHENSIVE METABOLIC PANEL
ALT: 6 U/L (ref 0–44)
AST: 11 U/L — ABNORMAL LOW (ref 15–41)
Albumin: 1.7 g/dL — ABNORMAL LOW (ref 3.5–5.0)
Alkaline Phosphatase: 130 U/L — ABNORMAL HIGH (ref 38–126)
Anion gap: 7 (ref 5–15)
BUN: 7 mg/dL (ref 6–20)
CO2: 29 mmol/L (ref 22–32)
Calcium: 8.4 mg/dL — ABNORMAL LOW (ref 8.9–10.3)
Chloride: 96 mmol/L — ABNORMAL LOW (ref 98–111)
Creatinine, Ser: 0.51 mg/dL (ref 0.44–1.00)
GFR, Estimated: >60 mL/min (ref >60)
Glucose, Bld: 82 mg/dL (ref 70–99)
Potassium: 4.1 mmol/L (ref 3.5–5.1)
Sodium: 132 mmol/L — ABNORMAL LOW (ref 135–145)
Total Bilirubin: 0.6 mg/dL (ref 0.3–1.2)
Total Protein: 6 g/dL — ABNORMAL LOW (ref 6.5–8.1)

## 2020-07-16 LAB — CULTURE, BLOOD (ROUTINE X 2)
Special Requests: ADEQUATE
Special Requests: ADEQUATE

## 2020-07-16 LAB — CBC
HCT: 26.2 % — ABNORMAL LOW (ref 36.0–46.0)
Hemoglobin: 8.3 g/dL — ABNORMAL LOW (ref 12.0–15.0)
MCH: 30 pg (ref 26.0–34.0)
MCHC: 31.7 g/dL (ref 30.0–36.0)
MCV: 94.6 fL (ref 80.0–100.0)
Platelets: 301 10*3/uL (ref 150–400)
RBC: 2.77 MIL/uL — ABNORMAL LOW (ref 3.87–5.11)
RDW: 13.7 % (ref 11.5–15.5)
WBC: 13 10*3/uL — ABNORMAL HIGH (ref 4.0–10.5)
nRBC: 0.2 % (ref 0.0–0.2)

## 2020-07-16 LAB — HIV-1 RNA QUANT-NO REFLEX-BLD
HIV 1 RNA Quant: <20 copies/mL
LOG10 HIV-1 RNA: UNDETERMINED log10copy/mL

## 2020-07-16 MED ORDER — FUROSEMIDE 10 MG/ML IJ SOLN
40.0000 mg | Freq: Two times a day (BID) | INTRAMUSCULAR | Status: DC
  Administered 2020-07-16 – 2020-07-18 (×4): 40 mg via INTRAVENOUS
  Filled 2020-07-16 (×4): qty 4

## 2020-07-16 NOTE — Progress Notes (Signed)
Progress Note  Patient Name: Kelly [name] Date of Encounter: 07/16/2020  Primary Cardiologist:  Glori Bickers, MD  Subjective   Feels tired.  Still complains of some foot pain.  Inpatient Medications    Scheduled Meds: . sodium chloride   Intravenous Once  . acetaminophen  650 mg Oral Q6H  . Chlorhexidine Gluconate Cloth  6 each Topical Q0600  . enoxaparin (LOVENOX) injection  40 mg Subcutaneous Q24H  . methadone  20 mg Oral Q8H  . mupirocin ointment  1 application Nasal BID  . prenatal vitamin w/FE, FA  1 tablet Oral Q1200   Continuous Infusions: . vancomycin 166.7 mL/hr at 07/16/20 0449   PRN Meds: docusate sodium, HYDROmorphone (DILAUDID) injection, polyethylene glycol, prochlorperazine, sodium chloride flush   Vital Signs    Vitals:   07/16/20 0058 07/16/20 0420 07/16/20 0426 07/16/20 0844  BP: 119/76 123/82  125/84  Pulse: 90 88  89  Resp: 20 20  18   Temp: 98 F (36.7 C) 97.7 F (36.5 C)  97.8 F (36.6 C)  TempSrc: Oral Oral  Oral  SpO2: 94% 99%    Weight:   92.8 kg   Height:        Intake/Output Summary (Last 24 hours) at 07/16/2020 0959 Last data filed at 07/16/2020 0449 Gross per 24 hour  Intake 1283.73 ml  Output 1675 ml  Net -391.27 ml   Filed Weights   07/14/20 0500 07/15/20 0500 07/16/20 0426  Weight: 90.7 kg 90.9 kg 92.8 kg    Telemetry    Sinus rhythm.  Personally reviewed.  ECG    No ECG reviewed.  Physical Exam   GEN: No acute distress.   Neck:  JVP just below jaw. Cardiac: RRR, 2/6 systolic murmur, no gallop.  Respiratory: Nonlabored. Clear to auscultation bilaterally. GI: Soft, gravid. MS:  2+ edema.  Labs    Chemistry Recent Labs  Lab 07/13/20 1035 07/13/20 2110 07/13/20 2119 07/14/20 0157 07/15/20 0416 07/16/20 0333  NA 129*  --    < > 129* 132* 132*  K 3.2*  --    < > 3.4* 3.9 4.1  CL 94*  --   --  96* 96* 96*  CO2 21*  --   --  19* 26 29  GLUCOSE 138*  --   --  103* 88 82  BUN 11  --   --  7 9 7    CREATININE 0.74  --   --  0.54 0.50 0.51  CALCIUM 8.6*  --   --  8.2* 8.4* 8.4*  PROT 7.1 6.3*  --   --   --  6.0*  ALBUMIN 2.3* 2.0*  --   --   --  1.7*  AST 18 14*  --   --   --  11*  ALT 7 6  --   --   --  6  ALKPHOS 138* 128*  --   --   --  130*  BILITOT 1.1 1.3*  --   --   --  0.6  GFRNONAA >60  --   --  >60 >60 >60  ANIONGAP 14  --   --  14 10 7    < > = values in this interval not displayed.     Hematology Recent Labs  Lab 07/13/20 1035 07/13/20 2119 07/14/20 0021 07/14/20 0157 07/16/20 0333  WBC 19.1*  --   --  20.4* 13.0*  RBC 3.04*  --   --  2.70* 2.77*  HGB  9.4*   < > 8.4* 8.4* 8.3*  HCT 28.5*   < > 25.3* 24.4* 26.2*  MCV 93.8  --   --  90.4 94.6  MCH 30.9  --   --  31.1 30.0  MCHC 33.0  --   --  34.4 31.7  RDW 14.3  --   --  13.9 13.7  PLT 322  --   --  238 301   < > = values in this interval not displayed.    Cardiac Enzymes Recent Labs  Lab 07/13/20 1219 07/13/20 1317  TROPONINIHS 14 19*    BNP Recent Labs  Lab 07/13/20 1219  BNP 157.1*     Radiology    DG Chest Port 1 View  Result Date: 07/15/2020 CLINICAL DATA:  Abnormal respiration EXAM: PORTABLE CHEST 1 VIEW COMPARISON:  07/13/2020 FINDINGS: Pulmonary insufflation has slightly improved and is symmetric. Superimposed multifocal pulmonary infiltrates, however, have progressed slightly in the interval, most in keeping with atypical infection or inflammation. No pneumothorax or pleural effusion. Cardiac size is at the upper limits of normal. Harrington rods again noted. IMPRESSION: Sided renal improvement in pulmonary insufflation. Progressive multifocal pulmonary infiltrates most in keeping with atypical infection or inflammation. Electronically Signed   By: Fidela Salisbury MD   On: 07/15/2020 06:03   DG Foot Complete Right  Result Date: 07/15/2020 CLINICAL DATA:  RIGHT foot pain EXAM: RIGHT FOOT COMPLETE - 3+ VIEW COMPARISON:  None FINDINGS: Osseous mineralization normal. Joint spaces preserved.  No acute fracture, dislocation, or bone destruction. IMPRESSION: Normal exam. Electronically Signed   By: Lavonia Dana M.D.   On: 07/15/2020 15:08    Cardiac Studies   Echocardiogram 07/13/2020: 1. Left ventricular ejection fraction, by estimation, is 55%. The left  ventricle has normal function. The left ventricle has no regional wall  motion abnormalities. Left ventricular diastolic parameters are  indeterminate.  2. Right ventricular systolic function is moderately reduced. The right  ventricular size is severely enlarged. There is severely elevated  pulmonary artery systolic pressure. The estimated right ventricular  systolic pressure is 29.7 mmHg.  3. Right atrial size was severely dilated.  4. A small pericardial effusion is present.  5. The mitral valve is normal in structure. No evidence of mitral valve  regurgitation. No evidence of mitral stenosis.  6. The tricuspid valve is abnormal. Tricuspid valve regurgitation is  severe.  7. The aortic valve is tricuspid. Aortic valve regurgitation is not  visualized. No aortic stenosis is present.  8. The inferior vena cava is dilated in size with <50% respiratory  variability, suggesting right atrial pressure of 15 mmHg.  9. No definite endocarditis was visualized. However, the right heart was  dilated and dysfunctional with severe pulmonary hypertension and severe  TR.   Assessment & Plan    1.  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  She has MRSA sepsis with 2 of 2 positive blood cultures, continues on antibiotics with follow-up by ID service.  TEE not planned at this point as 6-week course of antibiotics is recommended and valve is already known to be significantly abnormal, not candidate for operative repair.  2.  Acute on chronic combined heart failure, most recent LVEF actually 55% although with moderate RV dysfunction and severe pulmonary hypertension.  Still showing  evidence of fluid overload.  3.  [redacted] weeks pregnant.  OB following.  4.  RPR positive.  Reviewed chart and advanced heart failure team rounding notes.  Lasix  currently not on MAR, will start 40 mg IV twice daily.  Follow-up BMET in a.m.  Signed, Rozann Lesches, MD  07/16/2020, 9:59 AM

## 2020-07-16 NOTE — Progress Notes (Signed)
Pt is on pain meds and is unresponsive. FHR doppler 143 BPM. Abd soft to palpation. Pt's RN says that she has not noticed any vaginal bleeding or leaking of fluid when doing foley care.

## 2020-07-16 NOTE — Progress Notes (Signed)
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 -Current active IV heroin user uses about 3 g daily and IV fentanyl, presented to the ED with 3 to 4-week history of bilateral lower extremity edema associated with worsening dyspnea on exertion for 4 days, intermittent fevers for several weeks and pain with deep inspiration across her right lateral chest and flank. -In the ED he was noted to be fluid overloaded in addition blood cultures were positive for MRSA and she was started on IV vancomycin   Assessment & Plan:  MRSA bacteremia Presumed endocarditis -Continue IV vancomycin, appreciate infectious disease input -2D echocardiogram without obvious vegetation, TEE deferred, plan to empirically treat as endocarditis -Previously treated for Enterococcal tricuspid valve endocarditis in 2020 -Repeat blood cultures negative so far  Acute on chronic Right heart failure Severe TR Severe pulmonary hypertension -2D echo noted decreased RV systolic function, elevated PA pressures 92, severe TR -Volume status improving -Heart failure team following, continue IV Lasix -Monitor labs closely  IV heroin and fentanyl abuse Chronic pain -Reports that she uses 3 g of IV heroin daily and additional fentanyl -Currently on methadone 20 mg 3 times daily, QTC was prolonged on 3/3, will repeat  26-week pregnancy -OB service following, getting daily fetal heart assessment -Prenatal  Pneumonia Possible septic pulmonary infarcts -Continue IV vancomycin -Repeat chest x-ray in 2 to 3 days  Syphilis Positive RPR with rash, per ID 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 -Given penicillin 2.4 MU IM  DVT prophylaxis: Lovenox Code Status: Full code Family Communication: Patient denies having any family contacts    Consultants:  Cardiology, ID  Procedures:   Antimicrobials: IV Vanc   Subjective: -Complains of pain everywhere including right lateral chest wall with cough  Objective: Vitals:   07/16/20 0058 07/16/20 0420 07/16/20 0426 07/16/20 0844  BP: 119/76 123/82  125/84  Pulse: 90 88  89  Resp: 20 20  18   Temp: 98 F (36.7 C) 97.7 F (36.5 C)  97.8 F (36.6 C)  TempSrc: Oral Oral  Oral  SpO2: 94% 99%    Weight:   92.8 kg   Height:        Intake/Output Summary (Last 24 hours) at 07/16/2020 1208 Last data filed at 07/16/2020 0449 Gross per 24 hour  Intake 1283.73 ml  Output 1675 ml  Net -391.27 ml   Filed Weights   07/14/20 0500 07/15/20 0500 07/16/20 0426  Weight: 90.7 kg 90.9 kg 92.8 kg    Examination:  General exam: Young female laying in bed, AAOx3, no distress CVS: S1-S2, regular rate rhythm, systolic murmur noted Lungs: Clear bilaterally Abdomen: Soft, gravid uterus noted, bowel sounds present Extremities: 1+ edema, multiple scars  Skin: Extensive track marks   Data Reviewed:   CBC: Recent Labs  Lab 07/13/20 1035 07/13/20 2119 07/14/20 0021 07/14/20 0157 07/16/20 0333  WBC 19.1*  --   --  20.4* 13.0*  HGB 9.4* 6.8* 8.4* 8.4* 8.3*  HCT 28.5* 20.0* 25.3* 24.4* 26.2*  MCV 93.8  --   --  90.4 94.6  PLT 322  --   --  238 381   Basic Metabolic Panel: Recent Labs  Lab 07/13/20 1035 07/13/20  2119 07/14/20 0157 07/14/20 1947 07/15/20 0416 07/16/20 0333  NA 129* 130* 129*  --  132* 132*  K 3.2* 3.0* 3.4*  --  3.9 4.1  CL 94*  --  96*  --  96* 96*  CO2 21*  --  19*  --  26 29  GLUCOSE 138*  --  103*  --  88 82  BUN 11  --  7  --  9 7  CREATININE 0.74  --  0.54  --  0.50 0.51  CALCIUM 8.6*  --  8.2*  --  8.4* 8.4*  MG  --   --  1.2* 1.8 1.6*  --   PHOS  --   --  3.2  --  2.4*  --     GFR: Estimated Creatinine Clearance: 115.9 mL/min (by C-G formula based on SCr of 0.51 mg/dL). Liver Function Tests: Recent Labs  Lab 07/13/20 1035 07/13/20 2110 07/16/20 0333  AST 18 14* 11*  ALT 7 6 6   ALKPHOS 138* 128* 130*  BILITOT 1.1 1.3* 0.6  PROT 7.1 6.3* 6.0*  ALBUMIN 2.3* 2.0* 1.7*   Recent Labs  Lab 07/13/20 1035  LIPASE 19   No results for input(s): AMMONIA in the last 168 hours. Coagulation Profile: No results for input(s): INR, PROTIME in the last 168 hours. Cardiac Enzymes: Recent Labs  Lab 07/13/20 1219  CKTOTAL 68   BNP (last 3 results) No results for input(s): PROBNP in the last 8760 hours. HbA1C: Recent Labs    07/13/20 1952  HGBA1C 4.6*   CBG: Recent Labs  Lab 07/13/20 2039  GLUCAP 108*   Lipid Profile: No results for input(s): CHOL, HDL, LDLCALC, TRIG, CHOLHDL, LDLDIRECT in the last 72 hours. Thyroid Function Tests: No results for input(s): TSH, T4TOTAL, FREET4, T3FREE, THYROIDAB in the last 72 hours. Anemia Panel: No results for input(s): VITAMINB12, FOLATE, FERRITIN, TIBC, IRON, RETICCTPCT in the last 72 hours. Urine analysis:    Component Value Date/Time   COLORURINE AMBER (A) 07/13/2020 1320   APPEARANCEUR CLOUDY (A) 07/13/2020 1320   APPEARANCEUR Hazy 07/10/2013 1121   LABSPEC 1.028 07/13/2020 1320   LABSPEC 1.020 07/10/2013 1121   PHURINE 5.0 07/13/2020 1320   GLUCOSEU NEGATIVE 07/13/2020 1320   GLUCOSEU Negative 07/10/2013 1121   HGBUR SMALL (A) 07/13/2020 1320   BILIRUBINUR NEGATIVE 07/13/2020 1320   BILIRUBINUR Negative 07/10/2013 1121   KETONESUR 5 (A) 07/13/2020 1320   PROTEINUR 100 (A) 07/13/2020 1320   UROBILINOGEN 1.0 02/15/2015 0530   NITRITE POSITIVE (A) 07/13/2020 1320   LEUKOCYTESUR LARGE (A) 07/13/2020 1320   LEUKOCYTESUR Trace 07/10/2013 1121   Sepsis Labs: @LABRCNTIP (procalcitonin:4,lacticidven:4)  ) Recent Results (from the past 240 hour(s))  Resp Panel by RT-PCR (Flu A&B, Covid) Nasopharyngeal  Swab     Status: None   Collection Time: 07/13/20 11:21 AM   Specimen: Nasopharyngeal Swab; Nasopharyngeal(NP) swabs in vial transport medium  Result Value Ref Range Status   SARS Coronavirus 2 by RT PCR NEGATIVE NEGATIVE Final    Comment: (NOTE) SARS-CoV-2 target nucleic acids are NOT DETECTED.  The SARS-CoV-2 RNA is generally detectable in upper respiratory specimens during the acute phase of infection. The lowest concentration of SARS-CoV-2 viral copies this assay can detect is 138 copies/mL. A negative result does not preclude SARS-Cov-2 infection and should not be used as the sole basis for treatment or other patient management decisions. A negative result may occur with  improper specimen collection/handling, submission of specimen other than nasopharyngeal swab, presence of  viral mutation(s) within the areas targeted by this assay, and inadequate number of viral copies(<138 copies/mL). A negative result must be combined with clinical observations, patient history, and epidemiological information. The expected result is Negative.  Fact Sheet for Patients:  EntrepreneurPulse.com.au  Fact Sheet for Healthcare Providers:  IncredibleEmployment.be  This test is no t yet approved or cleared by the [place] FDA and  has been authorized for detection and/or diagnosis of SARS-CoV-2 by FDA under an Emergency Use Authorization (EUA). This EUA will remain  in effect (meaning this test can be used) for the duration of the COVID-19 declaration under Section 564(b)(1) of the Act, 21 U.S.C.section 360bbb-3(b)(1), unless the authorization is terminated  or revoked sooner.       Influenza A by PCR NEGATIVE NEGATIVE Final   Influenza B by PCR NEGATIVE NEGATIVE Final    Comment: (NOTE) The Xpert Xpress SARS-CoV-2/FLU/RSV plus assay is intended as an aid in the diagnosis of influenza from Nasopharyngeal swab specimens and should not be used as a sole  basis for treatment. Nasal washings and aspirates are unacceptable for Xpert Xpress SARS-CoV-2/FLU/RSV testing.  Fact Sheet for Patients: EntrepreneurPulse.com.au  Fact Sheet for Healthcare Providers: IncredibleEmployment.be  This test is not yet approved or cleared by the [place] FDA and has been authorized for detection and/or diagnosis of SARS-CoV-2 by FDA under an Emergency Use Authorization (EUA). This EUA will remain in effect (meaning this test can be used) for the duration of the COVID-19 declaration under Section 564(b)(1) of the Act, 21 U.S.C. section 360bbb-3(b)(1), unless the authorization is terminated or revoked.  Performed at [facility], [address], [place], [place] [address]   Culture, blood (routine x 2)     Status: Abnormal   Collection Time: 07/13/20 12:20 PM   Specimen: BLOOD  Result Value Ref Range Status   Specimen Description BLOOD SITE NOT SPECIFIED  Final   Special Requests   Final    BOTTLES DRAWN AEROBIC AND ANAEROBIC Blood Culture adequate volume   Culture  Setup Time   Final    GRAM POSITIVE COCCI IN CHAINS IN BOTH AEROBIC AND ANAEROBIC BOTTLES Organism ID to follow CRITICAL RESULT CALLED TO, READ BACK BY AND VERIFIED WITH: Performed at [facility], [place] [address], [place], [place] [address]    Culture METHICILLIN RESISTANT STAPHYLOCOCCUS AUREUS (A)  Final   Report Status 07/16/2020 FINAL  Final   Organism ID, Bacteria METHICILLIN RESISTANT STAPHYLOCOCCUS AUREUS  Final      Susceptibility   Methicillin resistant staphylococcus aureus - MIC*    CIPROFLOXACIN <=0.5 SENSITIVE Sensitive     ERYTHROMYCIN >=8 RESISTANT Resistant     GENTAMICIN <=0.5 SENSITIVE Sensitive     OXACILLIN >=4 RESISTANT Resistant     TETRACYCLINE <=1 SENSITIVE Sensitive     VANCOMYCIN <=0.5 SENSITIVE Sensitive     TRIMETH/SULFA <=10 SENSITIVE Sensitive     CLINDAMYCIN <=0.25 SENSITIVE Sensitive     RIFAMPIN  <=0.5 SENSITIVE Sensitive     Inducible Clindamycin NEGATIVE Sensitive     * METHICILLIN RESISTANT STAPHYLOCOCCUS AUREUS  Blood Culture ID Panel (Reflexed)     Status: Abnormal   Collection Time: 07/13/20 12:20 PM  Result Value Ref Range Status   Enterococcus faecalis NOT DETECTED NOT DETECTED Final   Enterococcus Faecium NOT DETECTED NOT DETECTED Final   Listeria monocytogenes NOT DETECTED NOT DETECTED Final   Staphylococcus species DETECTED (A) NOT DETECTED Final    Comment: CRITICAL RESULT CALLED TO, READ BACK  BY AND VERIFIED WITH: PHARMD AMEND CAREN BY MESSAN H. AT 0120 ON 07/14/2020    Staphylococcus aureus (BCID) DETECTED (A) NOT DETECTED Final    Comment: Methicillin (oxacillin)-resistant Staphylococcus aureus (MRSA). MRSA is predictably resistant to beta-lactam antibiotics (except ceftaroline). Preferred therapy is vancomycin unless clinically contraindicated. Patient requires contact precautions if  hospitalized. CRITICAL RESULT CALLED TO, READ BACK BY AND VERIFIED WITH: PHARMD AMEND CAREN BY MESSAN H. AT 0120 ON 07/14/2020    Staphylococcus epidermidis NOT DETECTED NOT DETECTED Final   Staphylococcus lugdunensis NOT DETECTED NOT DETECTED Final   Streptococcus species NOT DETECTED NOT DETECTED Final   Streptococcus agalactiae NOT DETECTED NOT DETECTED Final   Streptococcus pneumoniae NOT DETECTED NOT DETECTED Final   Streptococcus pyogenes NOT DETECTED NOT DETECTED Final   A.calcoaceticus-baumannii NOT DETECTED NOT DETECTED Final   Bacteroides fragilis NOT DETECTED NOT DETECTED Final   Enterobacterales NOT DETECTED NOT DETECTED Final   Enterobacter cloacae complex NOT DETECTED NOT DETECTED Final   Escherichia coli NOT DETECTED NOT DETECTED Final   Klebsiella aerogenes NOT DETECTED NOT DETECTED Final   Klebsiella oxytoca NOT DETECTED NOT DETECTED Final   Klebsiella pneumoniae NOT DETECTED NOT DETECTED Final   Proteus species NOT DETECTED NOT DETECTED Final   Salmonella species  NOT DETECTED NOT DETECTED Final   Serratia marcescens NOT DETECTED NOT DETECTED Final   Haemophilus influenzae NOT DETECTED NOT DETECTED Final   Neisseria meningitidis NOT DETECTED NOT DETECTED Final   Pseudomonas aeruginosa NOT DETECTED NOT DETECTED Final   Stenotrophomonas maltophilia NOT DETECTED NOT DETECTED Final   Candida albicans NOT DETECTED NOT DETECTED Final   Candida auris NOT DETECTED NOT DETECTED Final   Candida glabrata NOT DETECTED NOT DETECTED Final   Candida krusei NOT DETECTED NOT DETECTED Final   Candida parapsilosis NOT DETECTED NOT DETECTED Final   Candida tropicalis NOT DETECTED NOT DETECTED Final   Cryptococcus neoformans/gattii NOT DETECTED NOT DETECTED Final   Meth resistant mecA/C and MREJ DETECTED (A) NOT DETECTED Final    Comment: CRITICAL RESULT CALLED TO, READ BACK BY AND VERIFIED WITH: PHARMD AMEND CAREN BY MESSAN H. AT 0120 ON 07/14/2020 Performed at [facility], [address] [place], [place] [address]   Culture, blood (routine x 2)     Status: Abnormal   Collection Time: 07/13/20 12:21 PM   Specimen: BLOOD  Result Value Ref Range Status   Specimen Description BLOOD SITE NOT SPECIFIED  Final   Special Requests   Final    BOTTLES DRAWN AEROBIC AND ANAEROBIC Blood Culture adequate volume   Culture  Setup Time   Final    GRAM POSITIVE COCCI IN BOTH AEROBIC AND ANAEROBIC BOTTLES CRITICAL VALUE NOTED.  VALUE IS CONSISTENT WITH PREVIOUSLY REPORTED AND CALLED VALUE.    Culture (A)  Final    STAPHYLOCOCCUS AUREUS SUSCEPTIBILITIES PERFORMED ON PREVIOUS CULTURE WITHIN THE LAST 5 DAYS. Performed at [facility], [place] [address], [place], [place] [address]    Report Status 07/16/2020 FINAL  Final  MRSA PCR Screening     Status: Abnormal   Collection Time: 07/13/20  8:37 PM   Specimen: Nasal Mucosa; Nasopharyngeal  Result Value Ref Range Status   MRSA by PCR POSITIVE (A) NEGATIVE Final    Comment:        The GeneXpert MRSA Assay (FDA approved  for NASAL specimens only), is one component of a comprehensive MRSA colonization surveillance program. It is not intended to diagnose MRSA infection nor to guide or monitor  treatment for MRSA infections. RESULT CALLED TO, READ BACK BY AND VERIFIED WITH: TOLER,M RN 07/13/2020 AT 2319 SKEEN,P Performed at [facility], [place] [address] [place], [place] [address]   Urine culture     Status: None   Collection Time: 07/13/20 10:10 PM   Specimen: Urine, Clean Catch  Result Value Ref Range Status   Specimen Description URINE, CLEAN CATCH  Final   Special Requests NONE  Final   Culture   Final    NO GROWTH Performed at [facility], [address], [place], [place] [address]    Report Status 07/15/2020 FINAL  Final  Culture, blood (routine x 2)     Status: None (Preliminary result)   Collection Time: 07/15/20  6:49 PM   Specimen: BLOOD  Result Value Ref Range Status   Specimen Description BLOOD RIGHT ANTECUBITAL  Final   Special Requests   Final    BOTTLES DRAWN AEROBIC AND ANAEROBIC BACTEROIDES CACCAE   Culture   Final    NO GROWTH < 12 HOURS Performed at [facility], [place] [address] [place], [place] [address]    Report Status PENDING  Incomplete  Culture, blood (routine x 2)     Status: None (Preliminary result)   Collection Time: 07/15/20  6:50 PM   Specimen: BLOOD  Result Value Ref Range Status   Specimen Description BLOOD BLOOD RIGHT FOREARM  Final   Special Requests   Final    BOTTLES DRAWN AEROBIC AND ANAEROBIC Blood Culture adequate volume   Culture   Final    NO GROWTH < 12 HOURS Performed at [facility], [place] [address] [place], [place] [address]    Report Status PENDING  Incomplete    Radiology Studies: DG Chest Port 1 View  Result Date: 07/15/2020 CLINICAL DATA:  Abnormal respiration EXAM: PORTABLE CHEST 1 VIEW COMPARISON:  07/13/2020 FINDINGS: Pulmonary insufflation has slightly improved and is symmetric. Superimposed multifocal  pulmonary infiltrates, however, have progressed slightly in the interval, most in keeping with atypical infection or inflammation. No pneumothorax or pleural effusion. Cardiac size is at the upper limits of normal. Harrington rods again noted. IMPRESSION: Sided renal improvement in pulmonary insufflation. Progressive multifocal pulmonary infiltrates most in keeping with atypical infection or inflammation. Electronically Signed   By: Fidela Salisbury MD   On: 07/15/2020 06:03   DG Foot Complete Right  Result Date: 07/15/2020 CLINICAL DATA:  RIGHT foot pain EXAM: RIGHT FOOT COMPLETE - 3+ VIEW COMPARISON:  None FINDINGS: Osseous mineralization normal. Joint spaces preserved. No acute fracture, dislocation, or bone destruction. IMPRESSION: Normal exam. Electronically Signed   By: Lavonia Dana M.D.   On: 07/15/2020 15:08   Scheduled Meds:  sodium chloride   Intravenous Once   acetaminophen  650 mg Oral Q6H   Chlorhexidine Gluconate Cloth  6 each Topical Q0600   enoxaparin (LOVENOX) injection  40 mg Subcutaneous Q24H   furosemide  40 mg Intravenous BID   methadone  20 mg Oral Q8H   mupirocin ointment  1 application Nasal BID   prenatal vitamin w/FE, FA  1 tablet Oral Q1200   Continuous Infusions:  vancomycin 1,250 mg (07/16/20 1155)     LOS: 3 days    Time spent: 39mn  PDomenic Polite MD [facility] 07/16/2020, 12:08 PM

## 2020-07-17 ENCOUNTER — Inpatient Hospital Stay (HOSPITAL_COMMUNITY): Payer: Medicaid Other

## 2020-07-17 ENCOUNTER — Other Ambulatory Visit (HOSPITAL_COMMUNITY): Payer: Medicaid Other

## 2020-07-17 DIAGNOSIS — I5043 Acute on chronic combined systolic (congestive) and diastolic (congestive) heart failure: Secondary | ICD-10-CM | POA: Diagnosis not present

## 2020-07-17 DIAGNOSIS — I079 Rheumatic tricuspid valve disease, unspecified: Secondary | ICD-10-CM | POA: Diagnosis not present

## 2020-07-17 LAB — RETICULOCYTES
Immature Retic Fract: 36.3 % — ABNORMAL HIGH (ref 2.3–15.9)
RBC.: 2.48 MIL/uL — ABNORMAL LOW (ref 3.87–5.11)
Retic Count, Absolute: 54.6 10*3/uL (ref 19.0–186.0)
Retic Ct Pct: 2.2 % (ref 0.4–3.1)

## 2020-07-17 LAB — COMPREHENSIVE METABOLIC PANEL
ALT: <5 U/L (ref 0–44)
AST: 11 U/L — ABNORMAL LOW (ref 15–41)
Albumin: 1.7 g/dL — ABNORMAL LOW (ref 3.5–5.0)
Alkaline Phosphatase: 163 U/L — ABNORMAL HIGH (ref 38–126)
Anion gap: 9 (ref 5–15)
BUN: 7 mg/dL (ref 6–20)
CO2: 27 mmol/L (ref 22–32)
Calcium: 8.2 mg/dL — ABNORMAL LOW (ref 8.9–10.3)
Chloride: 97 mmol/L — ABNORMAL LOW (ref 98–111)
Creatinine, Ser: 0.47 mg/dL (ref 0.44–1.00)
GFR, Estimated: >60 mL/min (ref >60)
Glucose, Bld: 81 mg/dL (ref 70–99)
Potassium: 4 mmol/L (ref 3.5–5.1)
Sodium: 133 mmol/L — ABNORMAL LOW (ref 135–145)
Total Bilirubin: 0.7 mg/dL (ref 0.3–1.2)
Total Protein: 6.1 g/dL — ABNORMAL LOW (ref 6.5–8.1)

## 2020-07-17 LAB — IRON AND TIBC
Iron: 32 ug/dL (ref 28–170)
Saturation Ratios: 9 % — ABNORMAL LOW (ref 10.4–31.8)
TIBC: 365 ug/dL (ref 250–450)
UIBC: 333 ug/dL

## 2020-07-17 LAB — CBC
HCT: 23.6 % — ABNORMAL LOW (ref 36.0–46.0)
Hemoglobin: 7.5 g/dL — ABNORMAL LOW (ref 12.0–15.0)
MCH: 30 pg (ref 26.0–34.0)
MCHC: 31.8 g/dL (ref 30.0–36.0)
MCV: 94.4 fL (ref 80.0–100.0)
Platelets: 299 10*3/uL (ref 150–400)
RBC: 2.5 MIL/uL — ABNORMAL LOW (ref 3.87–5.11)
RDW: 13.9 % (ref 11.5–15.5)
WBC: 11.8 10*3/uL — ABNORMAL HIGH (ref 4.0–10.5)
nRBC: 0.2 % (ref 0.0–0.2)

## 2020-07-17 LAB — FERRITIN: Ferritin: 112 ng/mL (ref 11–307)

## 2020-07-17 LAB — FOLATE: Folate: 7.4 ng/mL (ref >5.9)

## 2020-07-17 LAB — VANCOMYCIN, PEAK: Vancomycin Pk: 46 ug/mL — ABNORMAL HIGH (ref 30–40)

## 2020-07-17 LAB — VITAMIN B12: Vitamin B-12: 445 pg/mL (ref 180–914)

## 2020-07-17 MED ORDER — SENNOSIDES-DOCUSATE SODIUM 8.6-50 MG PO TABS
1.0000 | ORAL_TABLET | Freq: Two times a day (BID) | ORAL | Status: DC
  Administered 2020-07-17 – 2020-08-13 (×21): 1 via ORAL
  Filled 2020-07-17 (×40): qty 1

## 2020-07-17 MED ORDER — METHADONE HCL 10 MG PO TABS
30.0000 mg | ORAL_TABLET | Freq: Three times a day (TID) | ORAL | Status: AC
  Administered 2020-07-17 – 2020-07-25 (×26): 30 mg via ORAL
  Filled 2020-07-17 (×28): qty 3

## 2020-07-17 NOTE — Progress Notes (Signed)
Pt awake. Denies vaginal bleeding or leaking of fluid. FHR 147 by doppler.

## 2020-07-17 NOTE — Progress Notes (Signed)
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 -Current active IV heroin user uses about 3 g daily and IV fentanyl, presented to the ED with 3 to 4-week history of bilateral lower extremity edema associated with worsening dyspnea on exertion for 4 days, intermittent fevers for several weeks and pain with deep inspiration across her right lateral chest and flank. -In the ED he was noted to be fluid overloaded in addition blood cultures were positive for MRSA and she was started on IV vancomycin   Assessment & Plan:  MRSA bacteremia Presumed endocarditis -Continue IV vancomycin, appreciate infectious disease input -2D echocardiogram without obvious vegetation, TEE deferred, plan to empirically treat as endocarditis -Previously treated for Enterococcal tricuspid valve endocarditis in 2020 -Repeat blood cultures negative so far  Acute on chronic Right heart failure Severe TR Severe pulmonary hypertension -2D echo noted decreased RV systolic function, elevated PA pressures 92, severe TR -Volume status improving, -5.6 L -Cardiology following, continue IV Lasix  Chronic anemia -Hemoglobin at baseline from 8.5-9.5, now trending down -She denies any overt bleeding, no vaginal bleeding noted -Check anemia panel, transfuse if it trends down further  IV heroin and fentanyl abuse Chronic pain -Reports that she uses 3 g of IV heroin daily and additional fentanyl -Increase methadone to 300 3 times daily, QTC is normal -Also has increased pain in her right flank will check renal ultrasound -Add laxatives  26-week pregnancy -OB  service following, getting daily fetal heart assessment -Prenatal  Pneumonia Possible septic pulmonary infarcts -Continue IV vancomycin -Repeat chest x-ray in 2 to 3 days  Syphilis Positive RPR with rash, per ID 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 -Given penicillin 2.4 MU IM  DVT prophylaxis: Lovenox Code Status: Full code Family Communication: Patient denies having any family contacts    Consultants:  Cardiology, ID  Procedures:   Antimicrobials: IV Vanc   Subjective: -Complains of increased pain in her right side/flank  Objective: Vitals:   07/16/20 0844 07/17/20 0015 07/17/20 0431 07/17/20 0826  BP: 125/84 111/78 125/84 (!) 139/94  Pulse: 89 88 95 89  Resp: 18 18 18 18   Temp: 97.8 F (36.6 C) 98.1 F (36.7 C) 97.8 F (36.6 C) 97.8 F (36.6 C)  TempSrc: Oral Oral Oral Oral  SpO2:  98% 98% 96%  Weight:   91.8 kg   Height:        Intake/Output Summary (Last 24 hours) at 07/17/2020 1208 Last data filed at 07/17/2020 1011 Gross per 24 hour  Intake 488 ml  Output 4400 ml  Net -3912 ml   Filed Weights   07/15/20 0500 07/16/20 0426 07/17/20 0431  Weight: 90.9 kg 92.8 kg 91.8 kg    Examination:  General exam: Young female laying in bed, uncomfortable appearing, AAOx3 CVS: S1-S2, regular rhythm, systolic murmur Lungs: Clear bilaterally Abdomen: Soft, gravid uterus, bowel sounds present mild right flank tenderness Extremities: 1+ edema  Skin: Extensive track marks   Data Reviewed:   CBC: Recent Labs  Lab 07/13/20 1035 07/13/20 2119 07/14/20 0021 07/14/20 0157 07/16/20 0333 07/17/20 0622  WBC 19.1*  --   --  20.4* 13.0* 11.8*  HGB 9.4* 6.8* 8.4* 8.4* 8.3* 7.5*  HCT  28.5* 20.0* 25.3* 24.4* 26.2* 23.6*  MCV 93.8  --   --  90.4 94.6 94.4  PLT 322  --   --  238 301 846   Basic Metabolic Panel: Recent Labs  Lab 07/13/20 1035 07/13/20 2119 07/14/20 0157 07/14/20 1947 07/15/20 0416 07/16/20 0333 07/17/20 0622  NA 129* 130* 129*  --  132* 132*  133*  K 3.2* 3.0* 3.4*  --  3.9 4.1 4.0  CL 94*  --  96*  --  96* 96* 97*  CO2 21*  --  19*  --  26 29 27   GLUCOSE 138*  --  103*  --  88 82 81  BUN 11  --  7  --  9 7 7   CREATININE 0.74  --  0.54  --  0.50 0.51 0.47  CALCIUM 8.6*  --  8.2*  --  8.4* 8.4* 8.2*  MG  --   --  1.2* 1.8 1.6*  --   --   PHOS  --   --  3.2  --  2.4*  --   --    GFR: Estimated Creatinine Clearance: 115.3 mL/min (by C-G formula based on SCr of 0.47 mg/dL). Liver Function Tests: Recent Labs  Lab 07/13/20 1035 07/13/20 2110 07/16/20 0333 07/17/20 0622  AST 18 14* 11* 11*  ALT 7 6 6  <5  ALKPHOS 138* 128* 130* 163*  BILITOT 1.1 1.3* 0.6 0.7  PROT 7.1 6.3* 6.0* 6.1*  ALBUMIN 2.3* 2.0* 1.7* 1.7*   Recent Labs  Lab 07/13/20 1035  LIPASE 19   No results for input(s): AMMONIA in the last 168 hours. Coagulation Profile: No results for input(s): INR, PROTIME in the last 168 hours. Cardiac Enzymes: Recent Labs  Lab 07/13/20 1219  CKTOTAL 68   BNP (last 3 results) No results for input(s): PROBNP in the last 8760 hours. HbA1C: No results for input(s): HGBA1C in the last 72 hours. CBG: Recent Labs  Lab 07/13/20 2039  GLUCAP 108*   Lipid Profile: No results for input(s): CHOL, HDL, LDLCALC, TRIG, CHOLHDL, LDLDIRECT in the last 72 hours. Thyroid Function Tests: No results for input(s): TSH, T4TOTAL, FREET4, T3FREE, THYROIDAB in the last 72 hours. Anemia Panel: No results for input(s): VITAMINB12, FOLATE, FERRITIN, TIBC, IRON, RETICCTPCT in the last 72 hours. Urine analysis:    Component Value Date/Time   COLORURINE AMBER (A) 07/13/2020 1320   APPEARANCEUR CLOUDY (A) 07/13/2020 1320   APPEARANCEUR Hazy 07/10/2013 1121   LABSPEC 1.028 07/13/2020 1320   LABSPEC 1.020 07/10/2013 1121   PHURINE 5.0 07/13/2020 1320   GLUCOSEU NEGATIVE 07/13/2020 1320   GLUCOSEU Negative 07/10/2013 1121   HGBUR SMALL (A) 07/13/2020 1320   BILIRUBINUR NEGATIVE 07/13/2020 1320   BILIRUBINUR Negative 07/10/2013  1121   KETONESUR 5 (A) 07/13/2020 1320   PROTEINUR 100 (A) 07/13/2020 1320   UROBILINOGEN 1.0 02/15/2015 0530   NITRITE POSITIVE (A) 07/13/2020 1320   LEUKOCYTESUR LARGE (A) 07/13/2020 1320   LEUKOCYTESUR Trace 07/10/2013 1121   Sepsis Labs: @LABRCNTIP (procalcitonin:4,lacticidven:4)  ) Recent Results (from the past 240 hour(s))  Resp Panel by RT-PCR (Flu A&B, Covid) Nasopharyngeal Swab     Status: None   Collection Time: 07/13/20 11:21 AM   Specimen: Nasopharyngeal Swab; Nasopharyngeal(NP) swabs in vial transport medium  Result Value Ref Range Status   SARS Coronavirus 2 by RT PCR NEGATIVE NEGATIVE Final    Comment: (NOTE) SARS-CoV-2 target nucleic acids are NOT DETECTED.  The SARS-CoV-2 RNA is generally detectable in upper respiratory  specimens during the acute phase of infection. The lowest concentration of SARS-CoV-2 viral copies this assay can detect is 138 copies/mL. A negative result does not preclude SARS-Cov-2 infection and should not be used as the sole basis for treatment or other patient management decisions. A negative result may occur with  improper specimen collection/handling, submission of specimen other than nasopharyngeal swab, presence of viral mutation(s) within the areas targeted by this assay, and inadequate number of viral copies(<138 copies/mL). A negative result must be combined with clinical observations, patient history, and epidemiological information. The expected result is Negative.  Fact Sheet for Patients:  EntrepreneurPulse.com.au  Fact Sheet for Healthcare Providers:  IncredibleEmployment.be  This test is no t yet approved or cleared by the [place] FDA and  has been authorized for detection and/or diagnosis of SARS-CoV-2 by FDA under an Emergency Use Authorization (EUA). This EUA will remain  in effect (meaning this test can be used) for the duration of the COVID-19 declaration under Section  564(b)(1) of the Act, 21 U.S.C.section 360bbb-3(b)(1), unless the authorization is terminated  or revoked sooner.       Influenza A by PCR NEGATIVE NEGATIVE Final   Influenza B by PCR NEGATIVE NEGATIVE Final    Comment: (NOTE) The Xpert Xpress SARS-CoV-2/FLU/RSV plus assay is intended as an aid in the diagnosis of influenza from Nasopharyngeal swab specimens and should not be used as a sole basis for treatment. Nasal washings and aspirates are unacceptable for Xpert Xpress SARS-CoV-2/FLU/RSV testing.  Fact Sheet for Patients: EntrepreneurPulse.com.au  Fact Sheet for Healthcare Providers: IncredibleEmployment.be  This test is not yet approved or cleared by the [place] FDA and has been authorized for detection and/or diagnosis of SARS-CoV-2 by FDA under an Emergency Use Authorization (EUA). This EUA will remain in effect (meaning this test can be used) for the duration of the COVID-19 declaration under Section 564(b)(1) of the Act, 21 U.S.C. section 360bbb-3(b)(1), unless the authorization is terminated or revoked.  Performed at [facility], [place] [address], [place], [place] [address]   Culture, blood (routine x 2)     Status: Abnormal   Collection Time: 07/13/20 12:20 PM   Specimen: BLOOD  Result Value Ref Range Status   Specimen Description BLOOD SITE NOT SPECIFIED  Final   Special Requests   Final    BOTTLES DRAWN AEROBIC AND ANAEROBIC Blood Culture adequate volume   Culture  Setup Time   Final    GRAM POSITIVE COCCI IN CHAINS IN BOTH AEROBIC AND ANAEROBIC BOTTLES Organism ID to follow CRITICAL RESULT CALLED TO, READ BACK BY AND VERIFIED WITH: Performed at [facility], [place] [address], [place],  [address]    Culture METHICILLIN RESISTANT STAPHYLOCOCCUS AUREUS (A)  Final   Report Status 07/16/2020 FINAL  Final   Organism ID, Bacteria METHICILLIN RESISTANT STAPHYLOCOCCUS AUREUS  Final      Susceptibility    Methicillin resistant staphylococcus aureus - MIC*    CIPROFLOXACIN <=0.5 SENSITIVE Sensitive     ERYTHROMYCIN >=8 RESISTANT Resistant     GENTAMICIN <=0.5 SENSITIVE Sensitive     OXACILLIN >=4 RESISTANT Resistant     TETRACYCLINE <=1 SENSITIVE Sensitive     VANCOMYCIN <=0.5 SENSITIVE Sensitive     TRIMETH/SULFA <=10 SENSITIVE Sensitive     CLINDAMYCIN <=0.25 SENSITIVE Sensitive     RIFAMPIN <=0.5 SENSITIVE Sensitive     Inducible Clindamycin NEGATIVE Sensitive     * METHICILLIN RESISTANT STAPHYLOCOCCUS AUREUS  Blood Culture ID Panel (Reflexed)  Status: Abnormal   Collection Time: 07/13/20 12:20 PM  Result Value Ref Range Status   Enterococcus faecalis NOT DETECTED NOT DETECTED Final   Enterococcus Faecium NOT DETECTED NOT DETECTED Final   Listeria monocytogenes NOT DETECTED NOT DETECTED Final   Staphylococcus species DETECTED (A) NOT DETECTED Final    Comment: CRITICAL RESULT CALLED TO, READ BACK BY AND VERIFIED WITH: PHARMD AMEND CAREN BY MESSAN H. AT 0120 ON 07/14/2020    Staphylococcus aureus (BCID) DETECTED (A) NOT DETECTED Final    Comment: Methicillin (oxacillin)-resistant Staphylococcus aureus (MRSA). MRSA is predictably resistant to beta-lactam antibiotics (except ceftaroline). Preferred therapy is vancomycin unless clinically contraindicated. Patient requires contact precautions if  hospitalized. CRITICAL RESULT CALLED TO, READ BACK BY AND VERIFIED WITH: PHARMD AMEND CAREN BY MESSAN H. AT 0120 ON 07/14/2020    Staphylococcus epidermidis NOT DETECTED NOT DETECTED Final   Staphylococcus lugdunensis NOT DETECTED NOT DETECTED Final   Streptococcus species NOT DETECTED NOT DETECTED Final   Streptococcus agalactiae NOT DETECTED NOT DETECTED Final   Streptococcus pneumoniae NOT DETECTED NOT DETECTED Final   Streptococcus pyogenes NOT DETECTED NOT DETECTED Final   A.calcoaceticus-baumannii NOT DETECTED NOT DETECTED Final   Bacteroides fragilis NOT DETECTED NOT DETECTED Final    Enterobacterales NOT DETECTED NOT DETECTED Final   Enterobacter cloacae complex NOT DETECTED NOT DETECTED Final   Escherichia coli NOT DETECTED NOT DETECTED Final   Klebsiella aerogenes NOT DETECTED NOT DETECTED Final   Klebsiella oxytoca NOT DETECTED NOT DETECTED Final   Klebsiella pneumoniae NOT DETECTED NOT DETECTED Final   Proteus species NOT DETECTED NOT DETECTED Final   Salmonella species NOT DETECTED NOT DETECTED Final   Serratia marcescens NOT DETECTED NOT DETECTED Final   Haemophilus influenzae NOT DETECTED NOT DETECTED Final   Neisseria meningitidis NOT DETECTED NOT DETECTED Final   Pseudomonas aeruginosa NOT DETECTED NOT DETECTED Final   Stenotrophomonas maltophilia NOT DETECTED NOT DETECTED Final   Candida albicans NOT DETECTED NOT DETECTED Final   Candida auris NOT DETECTED NOT DETECTED Final   Candida glabrata NOT DETECTED NOT DETECTED Final   Candida krusei NOT DETECTED NOT DETECTED Final   Candida parapsilosis NOT DETECTED NOT DETECTED Final   Candida tropicalis NOT DETECTED NOT DETECTED Final   Cryptococcus neoformans/gattii NOT DETECTED NOT DETECTED Final   Meth resistant mecA/C and MREJ DETECTED (A) NOT DETECTED Final    Comment: CRITICAL RESULT CALLED TO, READ BACK BY AND VERIFIED WITH: PHARMD AMEND CAREN BY MESSAN H. AT 0120 ON 07/14/2020 Performed at [facility], [address] [place], [place] [address]   Culture, blood (routine x 2)     Status: Abnormal   Collection Time: 07/13/20 12:21 PM   Specimen: BLOOD  Result Value Ref Range Status   Specimen Description BLOOD SITE NOT SPECIFIED  Final   Special Requests   Final    BOTTLES DRAWN AEROBIC AND ANAEROBIC Blood Culture adequate volume   Culture  Setup Time   Final    GRAM POSITIVE COCCI IN BOTH AEROBIC AND ANAEROBIC BOTTLES CRITICAL VALUE NOTED.  VALUE IS CONSISTENT WITH PREVIOUSLY REPORTED AND CALLED VALUE.    Culture (A)  Final    STAPHYLOCOCCUS AUREUS SUSCEPTIBILITIES PERFORMED ON PREVIOUS  CULTURE WITHIN THE LAST 5 DAYS. Performed at [facility], [place] [address], [place], [place] [address]    Report Status 07/16/2020 FINAL  Final  MRSA PCR Screening     Status: Abnormal   Collection Time: 07/13/20  8:37 PM   Specimen: Nasal  Mucosa; Nasopharyngeal  Result Value Ref Range Status   MRSA by PCR POSITIVE (A) NEGATIVE Final    Comment:        The GeneXpert MRSA Assay (FDA approved for NASAL specimens only), is one component of a comprehensive MRSA colonization surveillance program. It is not intended to diagnose MRSA infection nor to guide or monitor treatment for MRSA infections. RESULT CALLED TO, READ BACK BY AND VERIFIED WITH: TOLER,M RN 07/13/2020 AT 2319 SKEEN,P Performed at [facility], [place] [address], [place], [place] [address]   Urine culture     Status: None   Collection Time: 07/13/20 10:10 PM   Specimen: Urine, Clean Catch  Result Value Ref Range Status   Specimen Description URINE, CLEAN CATCH  Final   Special Requests NONE  Final   Culture   Final    NO GROWTH Performed at [facility], [address], [place], [place] [address]    Report Status 07/15/2020 FINAL  Final  Culture, blood (routine x 2)     Status: None (Preliminary result)   Collection Time: 07/15/20  6:49 PM   Specimen: BLOOD  Result Value Ref Range Status   Specimen Description BLOOD RIGHT ANTECUBITAL  Final   Special Requests   Final    BOTTLES DRAWN AEROBIC AND ANAEROBIC BACTEROIDES CACCAE   Culture   Final    NO GROWTH 2 DAYS Performed at [facility], [place] [address], [place], [place] [address]    Report Status PENDING  Incomplete  Culture, blood (routine x 2)     Status: None (Preliminary result)   Collection Time: 07/15/20  6:50 PM   Specimen: BLOOD  Result Value Ref Range Status   Specimen Description BLOOD BLOOD RIGHT FOREARM  Final   Special Requests   Final    BOTTLES DRAWN AEROBIC AND ANAEROBIC Blood Culture adequate volume   Culture   Final     NO GROWTH 2 DAYS Performed at [facility], [place] [address], [place], [place] [address]    Report Status PENDING  Incomplete    Radiology Studies: DG Foot Complete Right  Result Date: 07/15/2020 CLINICAL DATA:  RIGHT foot pain EXAM: RIGHT FOOT COMPLETE - 3+ VIEW COMPARISON:  None FINDINGS: Osseous mineralization normal. Joint spaces preserved. No acute fracture, dislocation, or bone destruction. IMPRESSION: Normal exam. Electronically Signed   By: Lavonia Dana M.D.   On: 07/15/2020 15:08   Scheduled Meds: . sodium chloride   Intravenous Once  . acetaminophen  650 mg Oral Q6H  . Chlorhexidine Gluconate Cloth  6 each Topical Q0600  . enoxaparin (LOVENOX) injection  40 mg Subcutaneous Q24H  . furosemide  40 mg Intravenous BID  . methadone  30 mg Oral TID PC  . mupirocin ointment  1 application Nasal BID  . prenatal vitamin w/FE, FA  1 tablet Oral Q1200  . senna-docusate  1 tablet Oral BID   Continuous Infusions: . vancomycin 1,250 mg (07/17/20 0427)     LOS: 4 days    Time spent: 37mn  PDomenic Polite MD [facility] 07/17/2020, 12:08 PM

## 2020-07-17 NOTE — Progress Notes (Signed)
Progress Note  Patient Name: Kelly [name] Date of Encounter: 07/17/2020  Primary Cardiologist:  Glori Bickers, MD  Subjective   Complains of achiness all over.  Somnolent.  Inpatient Medications    Scheduled Meds: . sodium chloride   Intravenous Once  . acetaminophen  650 mg Oral Q6H  . Chlorhexidine Gluconate Cloth  6 each Topical Q0600  . enoxaparin (LOVENOX) injection  40 mg Subcutaneous Q24H  . furosemide  40 mg Intravenous BID  . methadone  20 mg Oral Q8H  . mupirocin ointment  1 application Nasal BID  . prenatal vitamin w/FE, FA  1 tablet Oral Q1200   Continuous Infusions: . vancomycin 1,250 mg (07/17/20 0427)   PRN Meds: docusate sodium, HYDROmorphone (DILAUDID) injection, polyethylene glycol, prochlorperazine, sodium chloride flush   Vital Signs    Vitals:   07/16/20 0426 07/16/20 0844 07/17/20 0015 07/17/20 0431  BP:  125/84 111/78 125/84  Pulse:  89 88 95  Resp:  18 18 18   Temp:  97.8 F (36.6 C) 98.1 F (36.7 C) 97.8 F (36.6 C)  TempSrc:  Oral Oral Oral  SpO2:   98% 98%  Weight: 92.8 kg   91.8 kg  Height:        Intake/Output Summary (Last 24 hours) at 07/17/2020 0820 Last data filed at 07/17/2020 0556 Gross per 24 hour  Intake 488 ml  Output 3400 ml  Net -2912 ml   Filed Weights   07/15/20 0500 07/16/20 0426 07/17/20 0431  Weight: 90.9 kg 92.8 kg 91.8 kg    Telemetry    Sinus rhythm.  Personally reviewed.  ECG    No ECG reviewed.  Physical Exam   GEN: No acute distress.   Neck:  JVP below jaw. Cardiac:  RRR with 2/6 systolic murmur, no gallop.  Respiratory: Nonlabored.  Clear to auscultation anteriorly. GI: Soft, gravid. MS:  1-2+ edema.  Labs    Chemistry Recent Labs  Lab 07/13/20 2110 07/13/20 2119 07/15/20 0416 07/16/20 0333 07/17/20 0622  NA  --    < > 132* 132* 133*  K  --    < > 3.9 4.1 4.0  CL  --    < > 96* 96* 97*  CO2  --    < > 26 29 27   GLUCOSE  --    < > 88 82 81  BUN  --    < > 9 7 7    CREATININE  --    < > 0.50 0.51 0.47  CALCIUM  --    < > 8.4* 8.4* 8.2*  PROT 6.3*  --   --  6.0* 6.1*  ALBUMIN 2.0*  --   --  1.7* 1.7*  AST 14*  --   --  11* 11*  ALT 6  --   --  6 <5  ALKPHOS 128*  --   --  130* 163*  BILITOT 1.3*  --   --  0.6 0.7  GFRNONAA  --    < > >60 >60 >60  ANIONGAP  --    < > 10 7 9    < > = values in this interval not displayed.     Hematology Recent Labs  Lab 07/14/20 0157 07/16/20 0333 07/17/20 0622  WBC 20.4* 13.0* 11.8*  RBC 2.70* 2.77* 2.50*  HGB 8.4* 8.3* 7.5*  HCT 24.4* 26.2* 23.6*  MCV 90.4 94.6 94.4  MCH 31.1 30.0 30.0  MCHC 34.4 31.7 31.8  RDW 13.9 13.7 13.9  PLT  238 301 299    Cardiac Enzymes Recent Labs  Lab 07/13/20 1219 07/13/20 1317  TROPONINIHS 14 19*    BNP Recent Labs  Lab 07/13/20 1219  BNP 157.1*     Radiology    DG Foot Complete Right  Result Date: 07/15/2020 CLINICAL DATA:  RIGHT foot pain EXAM: RIGHT FOOT COMPLETE - 3+ VIEW COMPARISON:  None FINDINGS: Osseous mineralization normal. Joint spaces preserved. No acute fracture, dislocation, or bone destruction. IMPRESSION: Normal exam. Electronically Signed   By: Lavonia Dana M.D.   On: 07/15/2020 15:08    Cardiac Studies   Echocardiogram 07/13/2020: 1. Left ventricular ejection fraction, by estimation, is 55%. The left  ventricle has normal function. The left ventricle has no regional wall  motion abnormalities. Left ventricular diastolic parameters are  indeterminate.  2. Right ventricular systolic function is moderately reduced. The right  ventricular size is severely enlarged. There is severely elevated  pulmonary artery systolic pressure. The estimated right ventricular  systolic pressure is 01.7 mmHg.  3. Right atrial size was severely dilated.  4. A small pericardial effusion is present.  5. The mitral valve is normal in structure. No evidence of mitral valve  regurgitation. No evidence of mitral stenosis.  6. The tricuspid valve is abnormal.  Tricuspid valve regurgitation is  severe.  7. The aortic valve is tricuspid. Aortic valve regurgitation is not  visualized. No aortic stenosis is present.  8. The inferior vena cava is dilated in size with <50% respiratory  variability, suggesting right atrial pressure of 15 mmHg.  9. No definite endocarditis was visualized. However, the right heart was  dilated and dysfunctional with severe pulmonary hypertension and severe  TR.   Assessment & Plan    1.  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  She has MRSA sepsis with 2 of 2 positive blood cultures, continues on antibiotics with follow-up by ID service.  TEE not planned at this point as 6-week course of antibiotics is recommended and valve is already known to be significantly abnormal, not candidate for operative repair.  2.  Acute on chronic combined heart failure, most recent LVEF actually 55% although with moderate RV dysfunction and severe pulmonary hypertension.  Standing Lasix initiated yesterday with good diuresis, net output of 2900 cc.  Renal function stable.  3.  [redacted] weeks pregnant.  OB following.  4.  RPR positive.  Follow-up with advanced heart failure team to resume tomorrow.  Continue Lasix 40 mg IV twice daily with recheck BMET in a.m.  Signed, Rozann Lesches, MD  07/17/2020, 8:20 AM

## 2020-07-18 ENCOUNTER — Inpatient Hospital Stay (HOSPITAL_COMMUNITY): Payer: Medicaid Other

## 2020-07-18 ENCOUNTER — Other Ambulatory Visit: Payer: Self-pay

## 2020-07-18 DIAGNOSIS — O99891 Other specified diseases and conditions complicating pregnancy: Secondary | ICD-10-CM | POA: Diagnosis not present

## 2020-07-18 DIAGNOSIS — B9562 Methicillin resistant Staphylococcus aureus infection as the cause of diseases classified elsewhere: Secondary | ICD-10-CM | POA: Diagnosis not present

## 2020-07-18 DIAGNOSIS — I50811 Acute right heart failure: Secondary | ICD-10-CM | POA: Diagnosis not present

## 2020-07-18 DIAGNOSIS — Z3A28 28 weeks gestation of pregnancy: Secondary | ICD-10-CM

## 2020-07-18 DIAGNOSIS — R52 Pain, unspecified: Secondary | ICD-10-CM

## 2020-07-18 DIAGNOSIS — R7881 Bacteremia: Secondary | ICD-10-CM | POA: Diagnosis not present

## 2020-07-18 DIAGNOSIS — R109 Unspecified abdominal pain: Secondary | ICD-10-CM

## 2020-07-18 DIAGNOSIS — J189 Pneumonia, unspecified organism: Secondary | ICD-10-CM

## 2020-07-18 DIAGNOSIS — Z3A26 26 weeks gestation of pregnancy: Secondary | ICD-10-CM

## 2020-07-18 DIAGNOSIS — Z3A27 27 weeks gestation of pregnancy: Secondary | ICD-10-CM

## 2020-07-18 DIAGNOSIS — O0932 Supervision of pregnancy with insufficient antenatal care, second trimester: Secondary | ICD-10-CM | POA: Diagnosis not present

## 2020-07-18 DIAGNOSIS — Z3A25 25 weeks gestation of pregnancy: Secondary | ICD-10-CM | POA: Diagnosis not present

## 2020-07-18 LAB — BASIC METABOLIC PANEL
Anion gap: 11 (ref 5–15)
BUN: 8 mg/dL (ref 6–20)
CO2: 27 mmol/L (ref 22–32)
Calcium: 8.6 mg/dL — ABNORMAL LOW (ref 8.9–10.3)
Chloride: 93 mmol/L — ABNORMAL LOW (ref 98–111)
Creatinine, Ser: 0.52 mg/dL (ref 0.44–1.00)
GFR, Estimated: >60 mL/min (ref >60)
Glucose, Bld: 95 mg/dL (ref 70–99)
Potassium: 3.8 mmol/L (ref 3.5–5.1)
Sodium: 131 mmol/L — ABNORMAL LOW (ref 135–145)

## 2020-07-18 LAB — CBC
HCT: 26.5 % — ABNORMAL LOW (ref 36.0–46.0)
Hemoglobin: 8.4 g/dL — ABNORMAL LOW (ref 12.0–15.0)
MCH: 29.9 pg (ref 26.0–34.0)
MCHC: 31.7 g/dL (ref 30.0–36.0)
MCV: 94.3 fL (ref 80.0–100.0)
Platelets: 317 10*3/uL (ref 150–400)
RBC: 2.81 MIL/uL — ABNORMAL LOW (ref 3.87–5.11)
RDW: 14.1 % (ref 11.5–15.5)
WBC: 12.5 10*3/uL — ABNORMAL HIGH (ref 4.0–10.5)
nRBC: 0 % (ref 0.0–0.2)

## 2020-07-18 LAB — VANCOMYCIN, PEAK: Vancomycin Pk: 46 ug/mL — ABNORMAL HIGH (ref 30–40)

## 2020-07-18 LAB — VANCOMYCIN, TROUGH: Vancomycin Tr: 14 ug/mL — ABNORMAL LOW (ref 15–20)

## 2020-07-18 MED ORDER — FUROSEMIDE 40 MG PO TABS
40.0000 mg | ORAL_TABLET | Freq: Every day | ORAL | Status: DC
  Administered 2020-07-19: 40 mg via ORAL
  Filled 2020-07-18: qty 1

## 2020-07-18 MED ORDER — CHLORHEXIDINE GLUCONATE CLOTH 2 % EX PADS
6.0000 | MEDICATED_PAD | Freq: Every day | CUTANEOUS | Status: DC
  Administered 2020-07-19 – 2020-08-25 (×35): 6 via TOPICAL

## 2020-07-18 MED ORDER — POTASSIUM CHLORIDE CRYS ER 20 MEQ PO TBCR
20.0000 meq | EXTENDED_RELEASE_TABLET | Freq: Once | ORAL | Status: AC
  Administered 2020-07-18: 20 meq via ORAL
  Filled 2020-07-18: qty 1

## 2020-07-18 MED ORDER — VANCOMYCIN HCL IN DEXTROSE 750-5 MG/150ML-% IV SOLN
750.0000 mg | Freq: Three times a day (TID) | INTRAVENOUS | Status: DC
  Administered 2020-07-18 – 2020-08-16 (×88): 750 mg via INTRAVENOUS
  Filled 2020-07-18 (×100): qty 150

## 2020-07-18 NOTE — Progress Notes (Signed)
     FACULTY PRACTICE Antenatal Progress Note  Kelly [name] is a 35 y.o. [phone] dated by 26wk scan currently [id number] for bacteremia, endocarditis and heart failure with h/o polysubstance abuse, severe TR  Length of Stay:  5  Days  Date of admission:07/13/2020  Subjective:   Reports continued back pain.  Patient reports the fetal movement as active. Patient reports uterine contraction  activity as none. Patient reports  vaginal bleeding as none. Patient describes fluid per vagina as none.  Vitals:  Blood pressure (!) 149/89, pulse 95, temperature 97.6 F (36.4 C), temperature source Oral, resp. rate 16, height 5' 7.5" (1.715 m), weight 91.8 kg, SpO2 98 %, not currently breastfeeding. Vitals:   07/17/20 2046 07/18/20 0455 07/18/20 0737 07/18/20 1229  BP: 126/78 118/68 117/72 (!) 149/89  Pulse: 94 84 91 95  Resp: 18 18 15 16   Temp: 98.2 F (36.8 C) 97.8 F (36.6 C) 97.6 F (36.4 C) 97.6 F (36.4 C)  TempSrc: Axillary Oral Oral Oral  SpO2: 94% 100% 98% 98%  Weight:      Height:       Physical Examination: General appearance - lying in bed, moaning in pain Mental status - cooperative Abdomen - gravid, soft and non-tender Extremities - edema much improved FHR: 166 by doppler- performed by OB RN   A/P: [id number] [id number]@ [id number] for MRSA bacteremia, suspected endocarditis and cardiac failure with history of polysubstance abuse -Management per IM team as current hospitalization is not related to pregnancy -From an OB standpoint, fetal well being reassuring- will continue with daily dopplers, no signs of any acute OB concerns. -[facility] by [facility]iology and Anesthesiology teams in 2020)- will review with anesthesia teams the plan for this pregnancy. The Cardiac team (Dr. BeHaroldine Lawsfeels that delivery here is acceptable.      UgVerita Schneiders MD 07/18/2020,1:23 PM

## 2020-07-18 NOTE — Progress Notes (Signed)
Pt complaining of back and side pain. FHR 166 by doppler. Abd soft to palpation.  MD going into room to see pt, notified of pt's complaint of pain. No uc's palpated, FHR 166 by doppler.

## 2020-07-18 NOTE — TOC Progression Note (Signed)
Transition of Care (TOC) - Progression Note Heart Failure   Patient Details  Name: Kelly [name] MRN: [id number] Date of Birth: [birth year]  Transition of Care [name]) CM/SW [place], [place] Phone Number: 07/18/2020, 2:10 PM  Clinical Narrative:    CSW attempted to screen Ms. Cotterman for [employer], however Ms. Kang was sleeping and CSW attempted to wake Ms. Hinks who shook her head for CSW to come back at another time.  CSW will follow up with Ms. Stage at another time.         Expected Discharge Plan and Services                                                 Social Determinants of Health (SDOH) Interventions    Readmission Risk Interventions Readmission Risk Prevention Plan 07/30/2018  Transportation Screening Complete  Medication Review Press photographer) Complete  PCP or Specialist appointment within 3-5 days of discharge Complete  HRI or [place] Not Complete  HRI or Home Care Consult Pt Refusal Comments N/A  SW Recovery Care/Counseling Consult Complete  Palliative Care Screening Not Complete  Comments N/A  [facility] Not Complete  SNF Comments N/A  Some recent data might be hidden   Aamna Mallozzi, MSW, LCSWA [phone] Heart Failure Social Worker

## 2020-07-18 NOTE — Progress Notes (Signed)
Pt refused to go down for MRI stating she cannot lay flat due to difficulty breathing.  Idolina Primer, RN

## 2020-07-18 NOTE — Progress Notes (Addendum)
Advanced Heart Failure Rounding Note   Subjective:    RPR screen +  Over the weekend diuresed with IV lasix with brisk diuresis noted.   Complaining of some pain r flank. Denies SOB. Says her legs are back to normal.     Objective:   Weight Range:  Vital Signs:   Temp:  [97.6 F (36.4 C)-98.2 F (36.8 C)] 97.6 F (36.4 C) (03/07 0737) Pulse Rate:  [81-94] 91 (03/07 0737) Resp:  [15-18] 15 (03/07 0737) BP: (117-130)/(68-94) 117/72 (03/07 0737) SpO2:  [94 %-100 %] 98 % (03/07 0737) Last BM Date:  (pta)  Weight change: Filed Weights   07/15/20 0500 07/16/20 0426 07/17/20 0431  Weight: 90.9 kg 92.8 kg 91.8 kg    Intake/Output:   Intake/Output Summary (Last 24 hours) at 07/18/2020 0938 Last data filed at 07/18/2020 0000 Gross per 24 hour  Intake 970 ml  Output 4375 ml  Net -3405 ml     General:  No resp difficulty HEENT: Poor dentition Neck: supple. JVP with prominent V-V waves.  Carotids 2+ bilat; no bruits. No lymphadenopathy or thryomegaly appreciated. Cor: PMI nondisplaced. Regular rate & rhythm. No rubs, gallops. 2/6 TR . Lungs: clear Abdomen: soft, nontender, nondistended. No hepatosplenomegaly. No bruits or masses. Good bowel sounds. Extremities: no cyanosis, clubbing, rash, edema Neuro: alert & orientedx3, cranial nerves grossly intact. moves all 4 extremities w/o difficulty. Affect pleasant  Telemetry:SR 80-90s    Labs: Basic Metabolic Panel: Recent Labs  Lab 07/14/20 0157 07/14/20 1947 07/15/20 0416 07/16/20 0333 07/17/20 0622 07/18/20 0500  NA 129*  --  132* 132* 133* 131*  K 3.4*  --  3.9 4.1 4.0 3.8  CL 96*  --  96* 96* 97* 93*  CO2 19*  --  26 29 27 27   GLUCOSE 103*  --  88 82 81 95  BUN 7  --  9 7 7 8   CREATININE 0.54  --  0.50 0.51 0.47 0.52  CALCIUM 8.2*  --  8.4* 8.4* 8.2* 8.6*  MG 1.2* 1.8 1.6*  --   --   --   PHOS 3.2  --  2.4*  --   --   --     Liver Function Tests: Recent Labs  Lab 07/13/20 1035 07/13/20 2110  07/16/20 0333 07/17/20 0622  AST 18 14* 11* 11*  ALT 7 6 6  <5  ALKPHOS 138* 128* 130* 163*  BILITOT 1.1 1.3* 0.6 0.7  PROT 7.1 6.3* 6.0* 6.1*  ALBUMIN 2.3* 2.0* 1.7* 1.7*   Recent Labs  Lab 07/13/20 1035  LIPASE 19   No results for input(s): AMMONIA in the last 168 hours.  CBC: Recent Labs  Lab 07/13/20 1035 07/13/20 2119 07/14/20 0021 07/14/20 0157 07/16/20 0333 07/17/20 0622 07/18/20 0500  WBC 19.1*  --   --  20.4* 13.0* 11.8* 12.5*  HGB 9.4*   < > 8.4* 8.4* 8.3* 7.5* 8.4*  HCT 28.5*   < > 25.3* 24.4* 26.2* 23.6* 26.5*  MCV 93.8  --   --  90.4 94.6 94.4 94.3  PLT 322  --   --  238 301 299 317   < > = values in this interval not displayed.    Cardiac Enzymes: Recent Labs  Lab 07/13/20 1219  CKTOTAL 68    BNP: BNP (last 3 results) Recent Labs    07/13/20 1219  BNP 157.1*    ProBNP (last 3 results) No results for input(s): PROBNP in the last 8760 hours.  Other results:  Imaging: US RENAL  Result Date: 07/17/2020 CLINICAL DATA:  Bilateral flank pain.  Substance abuse. EXAM: RENAL / URINARY TRACT ULTRASOUND COMPLETE COMPARISON:  None. FINDINGS: Right Kidney: Renal measurements: 11.2 x 4.3 x 5.9 cm = volume: 145 mL. Echogenicity within normal limits. No mass or hydronephrosis visualized. Left Kidney: Renal measurements: 12.5 x 5.9 x 5.1 cm = volume: 196.3 mL. Echogenicity within normal limits. No mass or hydronephrosis visualized. Bladder: Not visualized. Other: None. IMPRESSION: 1. The kidneys are normal in appearance. The mild right hydronephrosis seen on an abdominal ultrasound from July 13, 2020 is not visualized today. Electronically Signed   By: Dorise Bullion III M.D   On: 07/17/2020 14:53     Medications:     Scheduled Medications: . sodium chloride   Intravenous Once  . acetaminophen  650 mg Oral Q6H  . Chlorhexidine Gluconate Cloth  6 each Topical Daily  . enoxaparin (LOVENOX) injection  40 mg Subcutaneous Q24H  . furosemide  40 mg  Intravenous BID  . methadone  30 mg Oral TID PC  . prenatal vitamin w/FE, FA  1 tablet Oral Q1200  . senna-docusate  1 tablet Oral BID    Infusions: . vancomycin 750 mg (07/18/20 0901)    PRN Medications: HYDROmorphone (DILAUDID) injection, polyethylene glycol, prochlorperazine, sodium chloride flush   Assessment/Plan:   1. MRSA Sepsis  - bcx 2/2 + for MRSA - clinically improved with IV abx and volume resuscitation.  - ID has seen and adjusting abx. Will need 6 weeks.  - Bld Cx 3/4 - No growth.  - no obvious vegetation on valve on TTE but assume she has recurrent endocarditis and would treat for 6 weeks with IV abx. D/w ID and both agree that TEE would not change management at this point as valve known to be heavily damaged from previous episodes and currently not candidate for operative repair.   2. Acute on Chronic Systolic CHF with Mostly Right Sided CHF (Likely Secondary to Severe TR) - Echo 07/13/20 showed normalization of LV function  LVEF of 55% with normal wall motion. RV severely enlarged with moderately reduced systolic function and severely elevated PASP of 92.46mHg. Severe TR noted but no vegetation visualized.  - Volume status improved. Lower extremity edema resolved.  - Stop IV lasix. Start lasix po lasix 40 mg po daily. - Remove foley.    3. 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 He was seen by Dr. GServando Snarein 05/2019 for possible surgical repair. However, patient was lost to follow-up and unfortunately started using drugs again. - Echo this admission shows severe TR but no signs of vegetation.  4. 26+w pregnant - u/s ok - OB following -Per Dr BHaroldine Laws- our local high-risk OB team would be happy to manage pregnancy here from my standpoint. Will d/w rest of team  5. RPR + - ID sent confirmatory test - Given penicillin.   6. Polysubstance  Abuse -Had using IV heroine + fentanyl - On methadone.   7.Anemia  -Hgb 8.4  -No bleeding noted.    Length of Stay: 5JuniataNP-C  07/18/2020, 9:38 AM  Advanced Heart Failure Team Pager [phone](M-F; 7a - 4p)  Please contact [email or web address] for night-coverage after hours (4p -7a ) and weekends on [email or web address]  Patient seen and examined with the above-signed Advanced Practice Provider and/or Housestaff. I personally reviewed  laboratory data, imaging studies and relevant notes. I independently examined the patient and formulated the important aspects of the plan. I have edited the note to reflect any of my changes or salient points. I have personally discussed the plan with the patient and/or family.  Good diuresis over the weekend. Still c/o pain at multiple sites and occasional SOB. Afebrile On IV abx and methadone.   General:  Lying in bed  No resp difficulty HEENT: normal poor dentition Neck: supple. JVP 10 Carotids 2+ bilat; no bruits. No lymphadenopathy or thryomegaly appreciated. Cor: PMI nondisplaced. Regular rate & rhythm. 3/6 TR Lungs: clear Abdomen: soft, + gravid No hepatosplenomegaly. No bruits or masses. Good bowel sounds. Extremities: no cyanosis, clubbing, rash, 1-2+ edema rach improving Neuro: alert & orientedx3, cranial nerves grossly intact. moves all 4 extremities w/o difficulty. Affect pleasant  Volume status improved but still with volume on board. Would continue to diurese. Continue IV abx. She will need 6 weeks   Given ongoing IVDA will need to understand strategy of how she will get 6 weeks of IV abx safely. Is plan for SNF or LTAC?   Glori Bickers, MD  10:36 AM

## 2020-07-18 NOTE — Progress Notes (Signed)
Pharmacy Antibiotic Note  Kelly [name] is a 35 y.o. female with MRSA bacteremia  And TV endocarditis. 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  Pharmacy has been consulted for vancomycin. Patient is pregnant with EGA of [redacted] weeks.  Vancomycin peak this AM = 46, trough = 14.  Caculated AUC = 747 (above goal).     Plan: Change vancomycin to 750 mg IV q 8 hrs Monitor C&S, clinical status, renal function, VT as needed  Temp (24hrs), Avg:97.8 F (36.6 C), Min:97.6 F (36.4 C), Max:98.2 F (36.8 C)  Recent Labs  Lab 07/13/20 1035 07/13/20 1219 07/13/20 1530 07/13/20 2110 07/14/20 0021 07/14/20 0157 07/15/20 0416 07/15/20 1849 07/16/20 0333 07/17/20 0622 07/17/20 1619 07/18/20 0030 07/18/20 0500 07/18/20 0530  WBC 19.1*  --   --   --   --  20.4*  --   --  13.0* 11.8*  --   --  12.5*  --   CREATININE 0.74  --   --   --   --  0.54 0.50  --  0.51 0.47  --   --  0.52  --   LATICACIDVEN  --  3.2* 2.0* 1.4 1.5  --   --   --   --   --   --   --   --   --   VANCOTROUGH  --   --   --   --   --   --   --  13*  --   --   --   --   --  14*  VANCOPEAK  --   --   --   --   --   --   --   --   --   --  46* 46*  --   --     Estimated Creatinine Clearance: 115.3 mL/min (by C-G formula based on SCr of 0.52 mg/dL).    Allergies  Allergen Reactions  . Hydrocodone Itching  . Morphine And Related Nausea And Vomiting    Antimicrobials this admission: Azith 3/2 x1 CTX 3/2 x1 Vancomycin 3/2> Cefepime 3/2> 3/3  Cultures: 3/2 urine - ng 3/2 Bld - GPC - MRSA 3/2 MRSA PCR pos 3/4 Bcx x 2 > NG3D   Thank you for allowing pharmacy to be a part of this patient's care.  [place] Crane, Roylene Reason, BCCP Clinical Pharmacist  07/18/2020 1:45 PM   [name] pharmacy phone numbers are listed on [email or web address]

## 2020-07-18 NOTE — Progress Notes (Signed)
Removed foley per protocol.  Idolina Primer, RN

## 2020-07-18 NOTE — Progress Notes (Signed)
Subjective: Complaining of nausea and feels that she is going into withdrawal still having bilateral pain in her flanks and lower back.   Antibiotics:  Anti-infectives (From admission, onward)   Start     Dose/Rate Route Frequency Ordered Stop   07/18/20 0830  vancomycin (VANCOCIN) IVPB 750 mg/150 ml premix        750 mg 150 mL/hr over 60 Minutes Intravenous Every 8 hours 07/18/20 0750     07/16/20 0400  Vancomycin (VANCOCIN) 1,250 mg in sodium chloride 0.9 % 250 mL IVPB  Status:  Discontinued        1,250 mg 166.7 mL/hr over 90 Minutes Intravenous Every 8 hours 07/15/20 1944 07/18/20 0749   07/15/20 1715  penicillin g benzathine (BICILLIN LA) 1200000 UNIT/2ML injection 2.4 Million Units        2.4 Million Units Intramuscular  Once 07/15/20 1621 07/15/20 1747   07/14/20 0300  vancomycin (VANCOCIN) IVPB 1000 mg/200 mL premix        1,000 mg 200 mL/hr over 60 Minutes Intravenous Every 8 hours 07/13/20 1806 07/15/20 2059   07/13/20 1730  Vancomycin (VANCOCIN) 1,500 mg in sodium chloride 0.9 % 500 mL IVPB        1,500 mg 250 mL/hr over 120 Minutes Intravenous  Once 07/13/20 1709 07/13/20 2116   07/13/20 1730  ceFEPIme (MAXIPIME) 2 g in sodium chloride 0.9 % 100 mL IVPB  Status:  Discontinued        2 g 200 mL/hr over 30 Minutes Intravenous Every 8 hours 07/13/20 1709 07/14/20 1050   07/13/20 1315  cefTRIAXone (ROCEPHIN) 1 g in sodium chloride 0.9 % 100 mL IVPB        1 g 200 mL/hr over 30 Minutes Intravenous  Once 07/13/20 1306 07/13/20 1538   07/13/20 1315  azithromycin (ZITHROMAX) 500 mg in sodium chloride 0.9 % 250 mL IVPB        500 mg 250 mL/hr over 60 Minutes Intravenous  Once 07/13/20 1306 07/13/20 1644      Medications: Scheduled Meds: . sodium chloride   Intravenous Once  . acetaminophen  650 mg Oral Q6H  . Chlorhexidine Gluconate Cloth  6 each Topical Daily  . enoxaparin (LOVENOX) injection  40 mg Subcutaneous Q24H  . furosemide  40 mg Oral Daily  .  methadone  30 mg Oral TID PC  . potassium chloride  20 mEq Oral Once  . prenatal vitamin w/FE, FA  1 tablet Oral Q1200  . senna-docusate  1 tablet Oral BID   Continuous Infusions: . vancomycin 750 mg (07/18/20 0901)   PRN Meds:.HYDROmorphone (DILAUDID) injection, polyethylene glycol, prochlorperazine, sodium chloride flush    Objective: Weight change:   Intake/Output Summary (Last 24 hours) at 07/18/2020 1412 Last data filed at 07/18/2020 0758 Gross per 24 hour  Intake 730 ml  Output 5875 ml  Net -5145 ml   Blood pressure (!) 149/89, pulse 95, temperature 97.6 F (36.4 C), temperature source Oral, resp. rate 16, height 5' 7.5" (1.715 m), weight 91.8 kg, SpO2 98 %, not currently breastfeeding. Temp:  [97.6 F (36.4 C)-98.2 F (36.8 C)] 97.6 F (36.4 C) (03/07 1229) Pulse Rate:  [81-95] 95 (03/07 1229) Resp:  [15-18] 16 (03/07 1229) BP: (117-149)/(68-94) 149/89 (03/07 1229) SpO2:  [94 %-100 %] 98 % (03/07 1229)  Physical Exam: Physical Exam Constitutional:      General: She is not in acute distress.    Appearance: She is well-developed and well-nourished.  She is not diaphoretic.  HENT:     Head: Normocephalic and atraumatic.     Right Ear: External ear normal.     Left Ear: External ear normal.     Mouth/Throat:     Mouth: Oropharynx is clear and moist.     Pharynx: No oropharyngeal exudate.  Eyes:     General: No scleral icterus.    Extraocular Movements: EOM normal.     Conjunctiva/sclera: Conjunctivae normal.     Pupils: Pupils are equal, round, and reactive to light.  Cardiovascular:     Rate and Rhythm: Regular rhythm. Tachycardia present.     Heart sounds: No murmur heard.   Pulmonary:     Effort: Pulmonary effort is normal. No respiratory distress.     Breath sounds: No wheezing.  Abdominal:     General: There is no distension.     Palpations: Abdomen is soft.  Musculoskeletal:        General: No tenderness or edema. Normal range of motion.   Lymphadenopathy:     Cervical: No cervical adenopathy.  Skin:    General: Skin is warm and dry.     Coloration: Skin is not pale.     Findings: No erythema or rash.  Neurological:     Mental Status: She is alert and oriented to person, place, and time.     Motor: No abnormal muscle tone.     Coordination: Coordination normal.  Psychiatric:        Attention and Perception: Attention normal.        Mood and Affect: Mood is anxious.        Speech: Speech normal.        Behavior: Behavior normal.        Thought Content: Thought content normal.        Cognition and Memory: Cognition normal.        Judgment: Judgment normal.      CBC:    BMET Recent Labs    07/17/20 0622 07/18/20 0500  NA 133* 131*  K 4.0 3.8  CL 97* 93*  CO2 27 27  GLUCOSE 81 95  BUN 7 8  CREATININE 0.47 0.52  CALCIUM 8.2* 8.6*     Liver Panel  Recent Labs    07/16/20 0333 07/17/20 0622  PROT 6.0* 6.1*  ALBUMIN 1.7* 1.7*  AST 11* 11*  ALT 6 <5  ALKPHOS 130* 163*  BILITOT 0.6 0.7       Sedimentation Rate No results for input(s): ESRSEDRATE in the last 72 hours. C-Reactive Protein No results for input(s): CRP in the last 72 hours.  Micro Results: Recent Results (from the past 720 hour(s))  Resp Panel by RT-PCR (Flu A&B, Covid) Nasopharyngeal Swab     Status: None   Collection Time: 07/13/20 11:21 AM   Specimen: Nasopharyngeal Swab; Nasopharyngeal(NP) swabs in vial transport medium  Result Value Ref Range Status   SARS Coronavirus 2 by RT PCR NEGATIVE NEGATIVE Final    Comment: (NOTE) SARS-CoV-2 target nucleic acids are NOT DETECTED.  The SARS-CoV-2 RNA is generally detectable in upper respiratory specimens during the acute phase of infection. The lowest concentration of SARS-CoV-2 viral copies this assay can detect is 138 copies/mL. A negative result does not preclude SARS-Cov-2 infection and should not be used as the sole basis for treatment or other patient management  decisions. A negative result may occur with  improper specimen collection/handling, submission of specimen other than nasopharyngeal swab, presence of viral mutation(s)  within the areas targeted by this assay, and inadequate number of viral copies(<138 copies/mL). A negative result must be combined with clinical observations, patient history, and epidemiological information. The expected result is Negative.  Fact Sheet for Patients:  EntrepreneurPulse.com.au  Fact Sheet for Healthcare Providers:  IncredibleEmployment.be  This test is no t yet approved or cleared by the [place] FDA and  has been authorized for detection and/or diagnosis of SARS-CoV-2 by FDA under an Emergency Use Authorization (EUA). This EUA will remain  in effect (meaning this test can be used) for the duration of the COVID-19 declaration under Section 564(b)(1) of the Act, 21 U.S.C.section 360bbb-3(b)(1), unless the authorization is terminated  or revoked sooner.       Influenza A by PCR NEGATIVE NEGATIVE Final   Influenza B by PCR NEGATIVE NEGATIVE Final    Comment: (NOTE) The Xpert Xpress SARS-CoV-2/FLU/RSV plus assay is intended as an aid in the diagnosis of influenza from Nasopharyngeal swab specimens and should not be used as a sole basis for treatment. Nasal washings and aspirates are unacceptable for Xpert Xpress SARS-CoV-2/FLU/RSV testing.  Fact Sheet for Patients: EntrepreneurPulse.com.au  Fact Sheet for Healthcare Providers: IncredibleEmployment.be  This test is not yet approved or cleared by the [place] FDA and has been authorized for detection and/or diagnosis of SARS-CoV-2 by FDA under an Emergency Use Authorization (EUA). This EUA will remain in effect (meaning this test can be used) for the duration of the COVID-19 declaration under Section 564(b)(1) of the Act, 21 U.S.C. section 360bbb-3(b)(1), unless the  authorization is terminated or revoked.  Performed at [facility], [place] [address] [place], [place] [address]   Culture, blood (routine x 2)     Status: Abnormal   Collection Time: 07/13/20 12:20 PM   Specimen: BLOOD  Result Value Ref Range Status   Specimen Description BLOOD SITE NOT SPECIFIED  Final   Special Requests   Final    BOTTLES DRAWN AEROBIC AND ANAEROBIC Blood Culture adequate volume   Culture  Setup Time   Final    GRAM POSITIVE COCCI IN CHAINS IN BOTH AEROBIC AND ANAEROBIC BOTTLES Organism ID to follow CRITICAL RESULT CALLED TO, READ BACK BY AND VERIFIED WITH: Performed at [facility], [place] [address] [place], [place] [address]    Culture METHICILLIN RESISTANT STAPHYLOCOCCUS AUREUS (A)  Final   Report Status 07/16/2020 FINAL  Final   Organism ID, Bacteria METHICILLIN RESISTANT STAPHYLOCOCCUS AUREUS  Final      Susceptibility   Methicillin resistant staphylococcus aureus - MIC*    CIPROFLOXACIN <=0.5 SENSITIVE Sensitive     ERYTHROMYCIN >=8 RESISTANT Resistant     GENTAMICIN <=0.5 SENSITIVE Sensitive     OXACILLIN >=4 RESISTANT Resistant     TETRACYCLINE <=1 SENSITIVE Sensitive     VANCOMYCIN <=0.5 SENSITIVE Sensitive     TRIMETH/SULFA <=10 SENSITIVE Sensitive     CLINDAMYCIN <=0.25 SENSITIVE Sensitive     RIFAMPIN <=0.5 SENSITIVE Sensitive     Inducible Clindamycin NEGATIVE Sensitive     * METHICILLIN RESISTANT STAPHYLOCOCCUS AUREUS  Blood Culture ID Panel (Reflexed)     Status: Abnormal   Collection Time: 07/13/20 12:20 PM  Result Value Ref Range Status   Enterococcus faecalis NOT DETECTED NOT DETECTED Final   Enterococcus Faecium NOT DETECTED NOT DETECTED Final   Listeria monocytogenes NOT DETECTED NOT DETECTED Final   Staphylococcus species DETECTED (A) NOT DETECTED Final    Comment: CRITICAL RESULT CALLED TO, READ BACK BY AND VERIFIED  WITH: PHARMD AMEND CAREN BY MESSAN H. AT 0120 ON 07/14/2020    Staphylococcus aureus (BCID) DETECTED (A) NOT  DETECTED Final    Comment: Methicillin (oxacillin)-resistant Staphylococcus aureus (MRSA). MRSA is predictably resistant to beta-lactam antibiotics (except ceftaroline). Preferred therapy is vancomycin unless clinically contraindicated. Patient requires contact precautions if  hospitalized. CRITICAL RESULT CALLED TO, READ BACK BY AND VERIFIED WITH: PHARMD AMEND CAREN BY MESSAN H. AT 0120 ON 07/14/2020    Staphylococcus epidermidis NOT DETECTED NOT DETECTED Final   Staphylococcus lugdunensis NOT DETECTED NOT DETECTED Final   Streptococcus species NOT DETECTED NOT DETECTED Final   Streptococcus agalactiae NOT DETECTED NOT DETECTED Final   Streptococcus pneumoniae NOT DETECTED NOT DETECTED Final   Streptococcus pyogenes NOT DETECTED NOT DETECTED Final   A.calcoaceticus-baumannii NOT DETECTED NOT DETECTED Final   Bacteroides fragilis NOT DETECTED NOT DETECTED Final   Enterobacterales NOT DETECTED NOT DETECTED Final   Enterobacter cloacae complex NOT DETECTED NOT DETECTED Final   Escherichia coli NOT DETECTED NOT DETECTED Final   Klebsiella aerogenes NOT DETECTED NOT DETECTED Final   Klebsiella oxytoca NOT DETECTED NOT DETECTED Final   Klebsiella pneumoniae NOT DETECTED NOT DETECTED Final   Proteus species NOT DETECTED NOT DETECTED Final   Salmonella species NOT DETECTED NOT DETECTED Final   Serratia marcescens NOT DETECTED NOT DETECTED Final   Haemophilus influenzae NOT DETECTED NOT DETECTED Final   Neisseria meningitidis NOT DETECTED NOT DETECTED Final   Pseudomonas aeruginosa NOT DETECTED NOT DETECTED Final   Stenotrophomonas maltophilia NOT DETECTED NOT DETECTED Final   Candida albicans NOT DETECTED NOT DETECTED Final   Candida auris NOT DETECTED NOT DETECTED Final   Candida glabrata NOT DETECTED NOT DETECTED Final   Candida krusei NOT DETECTED NOT DETECTED Final   Candida parapsilosis NOT DETECTED NOT DETECTED Final   Candida tropicalis NOT DETECTED NOT DETECTED Final   Cryptococcus  neoformans/gattii NOT DETECTED NOT DETECTED Final   Meth resistant mecA/C and MREJ DETECTED (A) NOT DETECTED Final    Comment: CRITICAL RESULT CALLED TO, READ BACK BY AND VERIFIED WITH: PHARMD AMEND CAREN BY MESSAN H. AT 0120 ON 07/14/2020 Performed at [facility], [address] [place], [place] [address]   Culture, blood (routine x 2)     Status: Abnormal   Collection Time: 07/13/20 12:21 PM   Specimen: BLOOD  Result Value Ref Range Status   Specimen Description BLOOD SITE NOT SPECIFIED  Final   Special Requests   Final    BOTTLES DRAWN AEROBIC AND ANAEROBIC Blood Culture adequate volume   Culture  Setup Time   Final    GRAM POSITIVE COCCI IN BOTH AEROBIC AND ANAEROBIC BOTTLES CRITICAL VALUE NOTED.  VALUE IS CONSISTENT WITH PREVIOUSLY REPORTED AND CALLED VALUE.    Culture (A)  Final    STAPHYLOCOCCUS AUREUS SUSCEPTIBILITIES PERFORMED ON PREVIOUS CULTURE WITHIN THE LAST 5 DAYS. Performed at [facility], [place] [address], [place], [place] [address]    Report Status 07/16/2020 FINAL  Final  MRSA PCR Screening     Status: Abnormal   Collection Time: 07/13/20  8:37 PM   Specimen: Nasal Mucosa; Nasopharyngeal  Result Value Ref Range Status   MRSA by PCR POSITIVE (A) NEGATIVE Final    Comment:        The GeneXpert MRSA Assay (FDA approved for NASAL specimens only), is one component of a comprehensive MRSA colonization surveillance program. It is not intended to diagnose MRSA infection nor to guide or monitor treatment for MRSA  infections. RESULT CALLED TO, READ BACK BY AND VERIFIED WITH: TOLER,M RN 07/13/2020 AT 2319 SKEEN,P Performed at [facility], [place] [address], [place], [place] [address]   Urine culture     Status: None   Collection Time: 07/13/20 10:10 PM   Specimen: Urine, Clean Catch  Result Value Ref Range Status   Specimen Description URINE, CLEAN CATCH  Final   Special Requests NONE  Final   Culture   Final    NO GROWTH Performed at [facility], [address], [place], [place] [address]    Report Status 07/15/2020 FINAL  Final  Culture, blood (routine x 2)     Status: None (Preliminary result)   Collection Time: 07/15/20  6:49 PM   Specimen: BLOOD  Result Value Ref Range Status   Specimen Description BLOOD RIGHT ANTECUBITAL  Final   Special Requests   Final    BOTTLES DRAWN AEROBIC AND ANAEROBIC BACTEROIDES CACCAE   Culture   Final    NO GROWTH 3 DAYS Performed at [facility], [place] [address], [place], [place] [address]    Report Status PENDING  Incomplete  Culture, blood (routine x 2)     Status: None (Preliminary result)   Collection Time: 07/15/20  6:50 PM   Specimen: BLOOD  Result Value Ref Range Status   Specimen Description BLOOD BLOOD RIGHT FOREARM  Final   Special Requests   Final    BOTTLES DRAWN AEROBIC AND ANAEROBIC Blood Culture adequate volume   Culture   Final    NO GROWTH 3 DAYS Performed at [facility], [place] [address], [place], [place] [address]    Report Status PENDING  Incomplete    Studies/Results: US RENAL  Result Date: 07/17/2020 CLINICAL DATA:  Bilateral flank pain.  Substance abuse. EXAM: RENAL / URINARY TRACT ULTRASOUND COMPLETE COMPARISON:  None. FINDINGS: Right Kidney: Renal measurements: 11.2 x 4.3 x 5.9 cm = volume: 145 mL. Echogenicity within normal limits. No mass or hydronephrosis visualized. Left Kidney: Renal measurements: 12.5 x 5.9 x 5.1 cm = volume: 196.3 mL. Echogenicity within normal limits. No mass or hydronephrosis visualized. Bladder: Not visualized. Other: None. IMPRESSION: 1. The kidneys are normal in appearance. The mild right hydronephrosis seen on an abdominal ultrasound from July 13, 2020 is not visualized today. Electronically Signed   By: Dorise Bullion III M.D   On: 07/17/2020 14:53      Assessment/Plan:  INTERVAL HISTORY: patients rash is improved after ceftriaxone and PCN   Principal Problem:   MRSA bacteremia Active Problems:    Opioid use disorder, severe, dependence ([place])   No prenatal care in current pregnancy in second trimester   Community acquired pneumonia   History of bacterial endocarditis   Tricuspid regurgitation   Dental caries   Endocarditis of tricuspid valve   Acute CHF (congestive heart failure) (HCC)   Normal IUP (intrauterine pregnancy) on prenatal ultrasound, third trimester   Cellulitis of right lower extremity   Septic embolism (HCC)   Syphilis   [redacted] weeks gestation of pregnancy    Mehgan N Housewright is a 35 y.o. female with admission yet again with right-sided endocarditis with MRSA and septic emboli to the lungs we suspect.  While she does not have overt vegetations on transthoracic echocardiogram I am confident that she has had them and they have embolized into her lungs.  She also has low back pain which could be related to septic embolization to her pregnancy  or could be due to pathology in her lumbar spine.  Fortunately her blood cultures appear to be clearing.  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   LOS: 5 days   Alcide Evener 07/18/2020, 2:12 PM

## 2020-07-18 NOTE — Progress Notes (Signed)
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 -Current active IV heroin user uses about 3 g daily and IV fentanyl, presented to the ED with 3 to 4-week history of bilateral lower extremity edema associated with worsening dyspnea on exertion for 4 days, intermittent fevers for several weeks and pain with deep inspiration across her right lateral chest and flank. -In the ED he was noted to be fluid overloaded in addition blood cultures were positive for MRSA and she was started on IV vancomycin.     Assessment & Plan:  MRSA bacteremia Presumed endocarditis -Continue IV vancomycin, ID following.  -2D echocardiogram without obvious vegetation, TEE deferred, plan to empirically treat as endocarditis.  -Previously treated for Enterococcal tricuspid valve endocarditis in 2020 -Repeat blood cultures negative so far  Acute on chronic Right heart failure Severe TR Severe pulmonary hypertension -2D echo noted decreased RV systolic function, elevated PA pressures 92, severe TR -Volume status improving, -5.6 L -Cardiology will follow from distance, Treated with  IV Lasix -Plan to change to oral lasix 3/7.  Chronic anemia -Hemoglobin at baseline from 8.5-9.5, now trending down -She denies any overt bleeding, no vaginal bleeding noted. -Anemia panel; B 12 445, Folate 7.4, iron 32, ferritin 112.  IV heroin and fentanyl abuse Chronic pain -Reports that she uses 3 g of IV heroin daily and additional fentanyl -Increase methadone to 30 3 times daily, QTC is normal -renal US; no evidence of hydronephrosis.  -Continue with  laxatives -On Iv dilaudid. Explain to patient risk to fetus.   26-week pregnancy -OB service following, getting daily fetal heart assessment -Prenatal -OB following.   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  Hyponatremia; monitor, stable.   DVT prophylaxis: Lovenox Code Status: Full code Family Communication: Patient denies having any family contacts    Consultants:  Cardiology, ID  Procedures:   Antimicrobials: IV Vanc   Subjective: Continue to complain of back pain.   Objective: Vitals:   07/17/20 2046 07/18/20 0455 07/18/20 0737 07/18/20 1229  BP: 126/78 118/68 117/72 (!) 149/89  Pulse: 94 84 91 95  Resp: 18 18 15 16   Temp: 98.2 F (36.8 C) 97.8 F (36.6 C) 97.6 F (36.4 C) 97.6 F (36.4 C)  TempSrc: Axillary Oral Oral Oral  SpO2: 94% 100% 98% 98%  Weight:      Height:        Intake/Output Summary (Last 24 hours) at 07/18/2020 1423 Last data filed at 07/18/2020 0758 Gross per 24 hour  Intake 730 ml  Output 5875 ml  Net -5145 ml   Filed Weights   07/15/20 0500 07/16/20 0426 07/17/20 0431  Weight: 90.9 kg 92.8 kg 91.8 kg    Examination:  General exam: NAD CVS: S 1, S 2 RRR Lungs: CTA Abdomen: Soft, gravid uterus, no tender Extremities 1 edema Skin: Extensive track marks   Data Reviewed:   CBC: Recent Labs  Lab 07/13/20 1035 07/13/20 2119 07/14/20 0021 07/14/20 0157 07/16/20 6301  07/17/20 0622 07/18/20 0500  WBC 19.1*  --   --  20.4* 13.0* 11.8* 12.5*  HGB 9.4*   < > 8.4* 8.4* 8.3* 7.5* 8.4*  HCT 28.5*   < > 25.3* 24.4* 26.2* 23.6* 26.5*  MCV 93.8  --   --  90.4 94.6 94.4 94.3  PLT 322  --   --  238 301 299 317   < > = values in this interval not displayed.   Basic Metabolic Panel: Recent Labs  Lab 07/14/20 0157  07/14/20 1947 07/15/20 0416 07/16/20 0333 07/17/20 0622 07/18/20 0500  NA 129*  --  132* 132* 133* 131*  K 3.4*  --  3.9 4.1 4.0 3.8  CL 96*  --  96* 96* 97* 93*  CO2 19*  --  26 29 27 27   GLUCOSE 103*  --  88 82 81 95  BUN 7  --  9 7 7 8   CREATININE 0.54  --  0.50 0.51 0.47 0.52  CALCIUM 8.2*  --  8.4* 8.4* 8.2* 8.6*  MG 1.2* 1.8 1.6*  --   --   --   PHOS 3.2  --  2.4*  --   --   --    GFR: Estimated Creatinine Clearance: 115.3 mL/min (by C-G formula based on SCr of 0.52 mg/dL). Liver Function Tests: Recent Labs  Lab 07/13/20 1035 07/13/20 2110 07/16/20 0333 07/17/20 0622  AST 18 14* 11* 11*  ALT 7 6 6  <5  ALKPHOS 138* 128* 130* 163*  BILITOT 1.1 1.3* 0.6 0.7  PROT 7.1 6.3* 6.0* 6.1*  ALBUMIN 2.3* 2.0* 1.7* 1.7*   Recent Labs  Lab 07/13/20 1035  LIPASE 19   No results for input(s): AMMONIA in the last 168 hours. Coagulation Profile: No results for input(s): INR, PROTIME in the last 168 hours. Cardiac Enzymes: Recent Labs  Lab 07/13/20 1219  CKTOTAL 68   BNP (last 3 results) No results for input(s): PROBNP in the last 8760 hours. HbA1C: No results for input(s): HGBA1C in the last 72 hours. CBG: Recent Labs  Lab 07/13/20 2039  GLUCAP 108*   Lipid Profile: No results for input(s): CHOL, HDL, LDLCALC, TRIG, CHOLHDL, LDLDIRECT in the last 72 hours. Thyroid Function Tests: No results for input(s): TSH, T4TOTAL, FREET4, T3FREE, THYROIDAB in the last 72 hours. Anemia Panel: Recent Labs    07/17/20 1520 07/17/20 1530  VITAMINB12  --  445  FOLATE  --  7.4  FERRITIN  --  112  TIBC  --  365  IRON  --  32  RETICCTPCT 2.2  --    Urine analysis:    Component Value Date/Time   COLORURINE AMBER (A) 07/13/2020 1320   APPEARANCEUR CLOUDY (A) 07/13/2020 1320   APPEARANCEUR Hazy 07/10/2013 1121   LABSPEC 1.028 07/13/2020 1320   LABSPEC 1.020 07/10/2013 1121   PHURINE 5.0 07/13/2020 1320   GLUCOSEU NEGATIVE 07/13/2020 1320   GLUCOSEU Negative 07/10/2013  1121   HGBUR SMALL (A) 07/13/2020 1320   BILIRUBINUR NEGATIVE 07/13/2020 1320   BILIRUBINUR Negative 07/10/2013 1121   KETONESUR 5 (A) 07/13/2020 1320   PROTEINUR 100 (A) 07/13/2020 1320   UROBILINOGEN 1.0 02/15/2015 0530   NITRITE POSITIVE (A) 07/13/2020 1320   LEUKOCYTESUR LARGE (A) 07/13/2020 1320   LEUKOCYTESUR Trace 07/10/2013 1121   Sepsis Labs: @LABRCNTIP (procalcitonin:4,lacticidven:4)  ) Recent Results (from the past 240 hour(s))  Resp Panel by RT-PCR (Flu A&B, Covid) Nasopharyngeal Swab     Status: None   Collection Time: 07/13/20  11:21 AM   Specimen: Nasopharyngeal Swab; Nasopharyngeal(NP) swabs in vial transport medium  Result Value Ref Range Status   SARS Coronavirus 2 by RT PCR NEGATIVE NEGATIVE Final    Comment: (NOTE) SARS-CoV-2 target nucleic acids are NOT DETECTED.  The SARS-CoV-2 RNA is generally detectable in upper respiratory specimens during the acute phase of infection. The lowest concentration of SARS-CoV-2 viral copies this assay can detect is 138 copies/mL. A negative result does not preclude SARS-Cov-2 infection and should not be used as the sole basis for treatment or other patient management decisions. A negative result may occur with  improper specimen collection/handling, submission of specimen other than nasopharyngeal swab, presence of viral mutation(s) within the areas targeted by this assay, and inadequate number of viral copies(<138 copies/mL). A negative result must be combined with clinical observations, patient history, and epidemiological information. The expected result is Negative.  Fact Sheet for Patients:  EntrepreneurPulse.com.au  Fact Sheet for Healthcare Providers:  IncredibleEmployment.be  This test is no t yet approved or cleared by the [place] FDA and  has been authorized for detection and/or diagnosis of SARS-CoV-2 by FDA under an Emergency Use Authorization (EUA). This EUA will  remain  in effect (meaning this test can be used) for the duration of the COVID-19 declaration under Section 564(b)(1) of the Act, 21 U.S.C.section 360bbb-3(b)(1), unless the authorization is terminated  or revoked sooner.       Influenza A by PCR NEGATIVE NEGATIVE Final   Influenza B by PCR NEGATIVE NEGATIVE Final    Comment: (NOTE) The Xpert Xpress SARS-CoV-2/FLU/RSV plus assay is intended as an aid in the diagnosis of influenza from Nasopharyngeal swab specimens and should not be used as a sole basis for treatment. Nasal washings and aspirates are unacceptable for Xpert Xpress SARS-CoV-2/FLU/RSV testing.  Fact Sheet for Patients: EntrepreneurPulse.com.au  Fact Sheet for Healthcare Providers: IncredibleEmployment.be  This test is not yet approved or cleared by the [place] FDA and has been authorized for detection and/or diagnosis of SARS-CoV-2 by FDA under an Emergency Use Authorization (EUA). This EUA will remain in effect (meaning this test can be used) for the duration of the COVID-19 declaration under Section 564(b)(1) of the Act, 21 U.S.C. section 360bbb-3(b)(1), unless the authorization is terminated or revoked.  Performed at [facility], [place] [address] [place], [place] [address]   Culture, blood (routine x 2)     Status: Abnormal   Collection Time: 07/13/20 12:20 PM   Specimen: BLOOD  Result Value Ref Range Status   Specimen Description BLOOD SITE NOT SPECIFIED  Final   Special Requests   Final    BOTTLES DRAWN AEROBIC AND ANAEROBIC Blood Culture adequate volume   Culture  Setup Time   Final    GRAM POSITIVE COCCI IN CHAINS IN BOTH AEROBIC AND ANAEROBIC BOTTLES Organism ID to follow CRITICAL RESULT CALLED TO, READ BACK BY AND VERIFIED WITH: Performed at [facility], [place] [address], [place], [place] [address]    Culture METHICILLIN RESISTANT STAPHYLOCOCCUS AUREUS (A)  Final   Report Status 07/16/2020  FINAL  Final   Organism ID, Bacteria METHICILLIN RESISTANT STAPHYLOCOCCUS AUREUS  Final      Susceptibility   Methicillin resistant staphylococcus aureus - MIC*    CIPROFLOXACIN <=0.5 SENSITIVE Sensitive     ERYTHROMYCIN >=8 RESISTANT Resistant     GENTAMICIN <=0.5 SENSITIVE Sensitive     OXACILLIN >=4 RESISTANT Resistant     TETRACYCLINE <=1 SENSITIVE Sensitive  VANCOMYCIN <=0.5 SENSITIVE Sensitive     TRIMETH/SULFA <=10 SENSITIVE Sensitive     CLINDAMYCIN <=0.25 SENSITIVE Sensitive     RIFAMPIN <=0.5 SENSITIVE Sensitive     Inducible Clindamycin NEGATIVE Sensitive     * METHICILLIN RESISTANT STAPHYLOCOCCUS AUREUS  Blood Culture ID Panel (Reflexed)     Status: Abnormal   Collection Time: 07/13/20 12:20 PM  Result Value Ref Range Status   Enterococcus faecalis NOT DETECTED NOT DETECTED Final   Enterococcus Faecium NOT DETECTED NOT DETECTED Final   Listeria monocytogenes NOT DETECTED NOT DETECTED Final   Staphylococcus species DETECTED (A) NOT DETECTED Final    Comment: CRITICAL RESULT CALLED TO, READ BACK BY AND VERIFIED WITH: PHARMD AMEND CAREN BY MESSAN H. AT 0120 ON 07/14/2020    Staphylococcus aureus (BCID) DETECTED (A) NOT DETECTED Final    Comment: Methicillin (oxacillin)-resistant Staphylococcus aureus (MRSA). MRSA is predictably resistant to beta-lactam antibiotics (except ceftaroline). Preferred therapy is vancomycin unless clinically contraindicated. Patient requires contact precautions if  hospitalized. CRITICAL RESULT CALLED TO, READ BACK BY AND VERIFIED WITH: PHARMD AMEND CAREN BY MESSAN H. AT 0120 ON 07/14/2020    Staphylococcus epidermidis NOT DETECTED NOT DETECTED Final   Staphylococcus lugdunensis NOT DETECTED NOT DETECTED Final   Streptococcus species NOT DETECTED NOT DETECTED Final   Streptococcus agalactiae NOT DETECTED NOT DETECTED Final   Streptococcus pneumoniae NOT DETECTED NOT DETECTED Final   Streptococcus pyogenes NOT DETECTED NOT DETECTED Final    A.calcoaceticus-baumannii NOT DETECTED NOT DETECTED Final   Bacteroides fragilis NOT DETECTED NOT DETECTED Final   Enterobacterales NOT DETECTED NOT DETECTED Final   Enterobacter cloacae complex NOT DETECTED NOT DETECTED Final   Escherichia coli NOT DETECTED NOT DETECTED Final   Klebsiella aerogenes NOT DETECTED NOT DETECTED Final   Klebsiella oxytoca NOT DETECTED NOT DETECTED Final   Klebsiella pneumoniae NOT DETECTED NOT DETECTED Final   Proteus species NOT DETECTED NOT DETECTED Final   Salmonella species NOT DETECTED NOT DETECTED Final   Serratia marcescens NOT DETECTED NOT DETECTED Final   Haemophilus influenzae NOT DETECTED NOT DETECTED Final   Neisseria meningitidis NOT DETECTED NOT DETECTED Final   Pseudomonas aeruginosa NOT DETECTED NOT DETECTED Final   Stenotrophomonas maltophilia NOT DETECTED NOT DETECTED Final   Candida albicans NOT DETECTED NOT DETECTED Final   Candida auris NOT DETECTED NOT DETECTED Final   Candida glabrata NOT DETECTED NOT DETECTED Final   Candida krusei NOT DETECTED NOT DETECTED Final   Candida parapsilosis NOT DETECTED NOT DETECTED Final   Candida tropicalis NOT DETECTED NOT DETECTED Final   Cryptococcus neoformans/gattii NOT DETECTED NOT DETECTED Final   Meth resistant mecA/C and MREJ DETECTED (A) NOT DETECTED Final    Comment: CRITICAL RESULT CALLED TO, READ BACK BY AND VERIFIED WITH: PHARMD AMEND CAREN BY MESSAN H. AT 0120 ON 07/14/2020 Performed at [facility], [address], [place], [place] [address]   Culture, blood (routine x 2)     Status: Abnormal   Collection Time: 07/13/20 12:21 PM   Specimen: BLOOD  Result Value Ref Range Status   Specimen Description BLOOD SITE NOT SPECIFIED  Final   Special Requests   Final    BOTTLES DRAWN AEROBIC AND ANAEROBIC Blood Culture adequate volume   Culture  Setup Time   Final    GRAM POSITIVE COCCI IN BOTH AEROBIC AND ANAEROBIC BOTTLES CRITICAL VALUE NOTED.  VALUE IS CONSISTENT WITH PREVIOUSLY  REPORTED AND CALLED VALUE.    Culture (A)  Final    STAPHYLOCOCCUS  AUREUS SUSCEPTIBILITIES PERFORMED ON PREVIOUS CULTURE WITHIN THE LAST 5 DAYS. Performed at [facility], [place] [address] [place] [address] [place], [place] [address]    Report Status 07/16/2020 FINAL  Final  MRSA PCR Screening     Status: Abnormal   Collection Time: 07/13/20  8:37 PM   Specimen: Nasal Mucosa; Nasopharyngeal  Result Value Ref Range Status   MRSA by PCR POSITIVE (A) NEGATIVE Final    Comment:        The GeneXpert MRSA Assay (FDA approved for NASAL specimens only), is one component of a comprehensive MRSA colonization surveillance program. It is not intended to diagnose MRSA infection nor to guide or monitor treatment for MRSA infections. RESULT CALLED TO, READ BACK BY AND VERIFIED WITH: TOLER,M RN 07/13/2020 AT 2319 SKEEN,P Performed at [facility], [place] [address], [place], [place] [address]   Urine culture     Status: None   Collection Time: 07/13/20 10:10 PM   Specimen: Urine, Clean Catch  Result Value Ref Range Status   Specimen Description URINE, CLEAN CATCH  Final   Special Requests NONE  Final   Culture   Final    NO GROWTH Performed at [facility], [address], [place], [place] [address]    Report Status 07/15/2020 FINAL  Final  Culture, blood (routine x 2)     Status: None (Preliminary result)   Collection Time: 07/15/20  6:49 PM   Specimen: BLOOD  Result Value Ref Range Status   Specimen Description BLOOD RIGHT ANTECUBITAL  Final   Special Requests   Final    BOTTLES DRAWN AEROBIC AND ANAEROBIC BACTEROIDES CACCAE   Culture   Final    NO GROWTH 3 DAYS Performed at [facility], [place] [address] [place], [place] [address]    Report Status PENDING  Incomplete  Culture, blood (routine x 2)     Status: None (Preliminary result)   Collection Time: 07/15/20  6:50 PM   Specimen: BLOOD  Result Value Ref Range Status   Specimen Description BLOOD BLOOD RIGHT FOREARM  Final    Special Requests   Final    BOTTLES DRAWN AEROBIC AND ANAEROBIC Blood Culture adequate volume   Culture   Final    NO GROWTH 3 DAYS Performed at [facility], [place] [address], [place], [place] [address]    Report Status PENDING  Incomplete    Radiology Studies: US RENAL  Result Date: 07/17/2020 CLINICAL DATA:  Bilateral flank pain.  Substance abuse. EXAM: RENAL / URINARY TRACT ULTRASOUND COMPLETE COMPARISON:  None. FINDINGS: Right Kidney: Renal measurements: 11.2 x 4.3 x 5.9 cm = volume: 145 mL. Echogenicity within normal limits. No mass or hydronephrosis visualized. Left Kidney: Renal measurements: 12.5 x 5.9 x 5.1 cm = volume: 196.3 mL. Echogenicity within normal limits. No mass or hydronephrosis visualized. Bladder: Not visualized. Other: None. IMPRESSION: 1. The kidneys are normal in appearance. The mild right hydronephrosis seen on an abdominal ultrasound from July 13, 2020 is not visualized today. Electronically Signed   By: Dorise Bullion III M.D   On: 07/17/2020 14:53   Scheduled Meds: . sodium chloride   Intravenous Once  . acetaminophen  650 mg Oral Q6H  . Chlorhexidine Gluconate Cloth  6 each Topical Daily  . enoxaparin (LOVENOX) injection  40 mg Subcutaneous Q24H  . furosemide  40 mg Oral Daily  . methadone  30 mg Oral TID PC  . potassium chloride  20 mEq Oral  Once  . prenatal vitamin w/FE, FA  1 tablet Oral Q1200  . senna-docusate  1 tablet Oral BID   Continuous Infusions: . vancomycin 750 mg (07/18/20 0901)     LOS: 5 days    Time spent: 71mn  PDomenic Polite MD [facility] 07/18/2020, 2:23 PM

## 2020-07-19 DIAGNOSIS — I50811 Acute right heart failure: Secondary | ICD-10-CM | POA: Diagnosis not present

## 2020-07-19 DIAGNOSIS — L03115 Cellulitis of right lower limb: Secondary | ICD-10-CM | POA: Diagnosis not present

## 2020-07-19 DIAGNOSIS — O99891 Other specified diseases and conditions complicating pregnancy: Secondary | ICD-10-CM | POA: Diagnosis not present

## 2020-07-19 DIAGNOSIS — O26893 Other specified pregnancy related conditions, third trimester: Secondary | ICD-10-CM

## 2020-07-19 DIAGNOSIS — R0601 Orthopnea: Secondary | ICD-10-CM

## 2020-07-19 DIAGNOSIS — M549 Dorsalgia, unspecified: Secondary | ICD-10-CM

## 2020-07-19 DIAGNOSIS — Z3A25 25 weeks gestation of pregnancy: Secondary | ICD-10-CM | POA: Diagnosis not present

## 2020-07-19 DIAGNOSIS — R7881 Bacteremia: Secondary | ICD-10-CM | POA: Diagnosis not present

## 2020-07-19 LAB — BASIC METABOLIC PANEL
Anion gap: 9 (ref 5–15)
BUN: 8 mg/dL (ref 6–20)
CO2: 28 mmol/L (ref 22–32)
Calcium: 9.2 mg/dL (ref 8.9–10.3)
Chloride: 93 mmol/L — ABNORMAL LOW (ref 98–111)
Creatinine, Ser: 0.51 mg/dL (ref 0.44–1.00)
GFR, Estimated: >60 mL/min (ref >60)
Glucose, Bld: 80 mg/dL (ref 70–99)
Potassium: 4.7 mmol/L (ref 3.5–5.1)
Sodium: 130 mmol/L — ABNORMAL LOW (ref 135–145)

## 2020-07-19 LAB — T.PALLIDUM AB, TOTAL: T Pallidum Abs: REACTIVE — AB

## 2020-07-19 MED ORDER — ENOXAPARIN SODIUM 60 MG/0.6ML ~~LOC~~ SOLN
60.0000 mg | SUBCUTANEOUS | Status: DC
  Administered 2020-07-19 – 2020-07-24 (×6): 60 mg via SUBCUTANEOUS
  Filled 2020-07-19 (×6): qty 0.6

## 2020-07-19 MED ORDER — FUROSEMIDE 10 MG/ML IJ SOLN
40.0000 mg | Freq: Two times a day (BID) | INTRAMUSCULAR | Status: DC
  Administered 2020-07-19 – 2020-07-21 (×5): 40 mg via INTRAVENOUS
  Filled 2020-07-19 (×5): qty 4

## 2020-07-19 MED ORDER — POLYETHYLENE GLYCOL 3350 17 G PO PACK
17.0000 g | PACK | Freq: Two times a day (BID) | ORAL | Status: DC
  Administered 2020-07-19 – 2020-07-27 (×5): 17 g via ORAL
  Filled 2020-07-19 (×26): qty 1

## 2020-07-19 NOTE — Progress Notes (Signed)
[facility]  Date of Admission:  07/13/2020     Total days of antibiotics 7         ASSESSMENT:  Kelly [name] continues to increased back pain and orthopnea at night of unclear origin. Unable to tolerate MRI. She has diuresed about 5.8 L of fluid over the past 48 hours. Would anticipate infection related symptoms being constant. Blood cultures from 07/15/20 remain without growth to date. MRI unlikely to change ID plan of care at this point and can be deferred. Continue to monitor cultures for clearance of bacteremia and current dose of vancomycin. Will likely need to stay for duration of 6 week therapy.    PLAN:  1. Continue current dose of vancomycin 2. Continue diuresis and pain management per primary team 3. Monitor cultures for clearance of bacteremia. 4. Monitor renal function and vancomycin levels per protocol for therapeutic drug monitoring.   Principal Problem:   MRSA bacteremia Active Problems:   Tricuspid regurgitation   Opioid use disorder, severe, dependence ([place])   No prenatal care in current pregnancy in second trimester   Community acquired pneumonia   History of bacterial endocarditis   Dental caries   Endocarditis of tricuspid valve   Acute CHF (congestive heart failure) (HCC)   Normal IUP (intrauterine pregnancy) on prenatal ultrasound, third trimester   Cellulitis of right lower extremity   Septic embolism (HCC)   Syphilis   [redacted] weeks gestation of pregnancy   Flank pain   Pain   . sodium chloride   Intravenous Once  . acetaminophen  650 mg Oral Q6H  . Chlorhexidine Gluconate Cloth  6 each Topical Daily  . enoxaparin (LOVENOX) injection  60 mg Subcutaneous Q24H  . furosemide  40 mg Intravenous BID  . methadone  30 mg Oral TID PC  . polyethylene glycol  17 g Oral BID  . prenatal vitamin w/FE, FA  1 tablet Oral Q1200  . senna-docusate  1 tablet Oral BID    SUBJECTIVE:  Afebrile overnight. Continues to have issues with  pain and orthopnea.  Allergies  Allergen Reactions  . Hydrocodone Itching  . Morphine And Related Nausea And Vomiting     Review of Systems: Review of Systems  Constitutional: Negative for chills, fever and weight loss.  Respiratory: Negative for cough, shortness of breath and wheezing.   Cardiovascular: Negative for chest pain and leg swelling.  Gastrointestinal: Negative for abdominal pain, constipation, diarrhea, nausea and vomiting.  Musculoskeletal: Positive for back pain.  Skin: Negative for rash.      OBJECTIVE: Vitals:   07/18/20 2338 07/19/20 0519 07/19/20 0814 07/19/20 1251  BP: 125/87 126/80 (!) 117/91 (!) 123/91  Pulse:  93 83 89  Resp: (!) 22 16 16 18   Temp: 97.8 F (36.6 C) 97.8 F (36.6 C) 97.9 F (36.6 C) (!) 97.3 F (36.3 C)  TempSrc:  Oral Oral Oral  SpO2:  97% 98%   Weight:  84.4 kg    Height:       Body mass index is 28.71 kg/m.  Physical Exam Constitutional:      General: She is not in acute distress.    Appearance: She is well-developed and well-nourished. She is ill-appearing.     Comments: Lying in bed with head of bed elevated; pleasant.   Cardiovascular:     Rate and Rhythm: Normal rate and regular rhythm.     Pulses: Intact distal pulses.     Heart sounds: Normal heart sounds.  Pulmonary:     Effort: Pulmonary effort is normal.     Breath sounds: Normal breath sounds.  Skin:    General: Skin is warm and dry.  Neurological:     Mental Status: She is alert and oriented to person, place, and time.  Psychiatric:        Mood and Affect: Mood and affect normal.        Behavior: Behavior normal.        Thought Content: Thought content normal.        Judgment: Judgment normal.     Lab Results Lab Results  Component Value Date   WBC 12.5 (H) 07/18/2020   HGB 8.4 (L) 07/18/2020   HCT 26.5 (L) 07/18/2020   MCV 94.3 07/18/2020   PLT 317 07/18/2020    Lab Results  Component Value Date   CREATININE 0.51 07/19/2020   BUN 8  07/19/2020   NA 130 (L) 07/19/2020   K 4.7 07/19/2020   CL 93 (L) 07/19/2020   CO2 28 07/19/2020    Lab Results  Component Value Date   ALT <5 07/17/2020   AST 11 (L) 07/17/2020   ALKPHOS 163 (H) 07/17/2020   BILITOT 0.7 07/17/2020     Microbiology: Recent Results (from the past 240 hour(s))  Resp Panel by RT-PCR (Flu A&B, Covid) Nasopharyngeal Swab     Status: None   Collection Time: 07/13/20 11:21 AM   Specimen: Nasopharyngeal Swab; Nasopharyngeal(NP) swabs in vial transport medium  Result Value Ref Range Status   SARS Coronavirus 2 by RT PCR NEGATIVE NEGATIVE Final    Comment: (NOTE) SARS-CoV-2 target nucleic acids are NOT DETECTED.  The SARS-CoV-2 RNA is generally detectable in upper respiratory specimens during the acute phase of infection. The lowest concentration of SARS-CoV-2 viral copies this assay can detect is 138 copies/mL. A negative result does not preclude SARS-Cov-2 infection and should not be used as the sole basis for treatment or other patient management decisions. A negative result may occur with  improper specimen collection/handling, submission of specimen other than nasopharyngeal swab, presence of viral mutation(s) within the areas targeted by this assay, and inadequate number of viral copies(<138 copies/mL). A negative result must be combined with clinical observations, patient history, and epidemiological information. The expected result is Negative.  Fact Sheet for Patients:  EntrepreneurPulse.com.au  Fact Sheet for Healthcare Providers:  IncredibleEmployment.be  This test is no t yet approved or cleared by the [place] FDA and  has been authorized for detection and/or diagnosis of SARS-CoV-2 by FDA under an Emergency Use Authorization (EUA). This EUA will remain  in effect (meaning this test can be used) for the duration of the COVID-19 declaration under Section 564(b)(1) of the Act, 21 U.S.C.section  360bbb-3(b)(1), unless the authorization is terminated  or revoked sooner.       Influenza A by PCR NEGATIVE NEGATIVE Final   Influenza B by PCR NEGATIVE NEGATIVE Final    Comment: (NOTE) The Xpert Xpress SARS-CoV-2/FLU/RSV plus assay is intended as an aid in the diagnosis of influenza from Nasopharyngeal swab specimens and should not be used as a sole basis for treatment. Nasal washings and aspirates are unacceptable for Xpert Xpress SARS-CoV-2/FLU/RSV testing.  Fact Sheet for Patients: EntrepreneurPulse.com.au  Fact Sheet for Healthcare Providers: IncredibleEmployment.be  This test is not yet approved or cleared by the [place] FDA and has been authorized for detection and/or diagnosis of SARS-CoV-2 by FDA under an Emergency Use Authorization (EUA). This EUA will remain in  effect (meaning this test can be used) for the duration of the COVID-19 declaration under Section 564(b)(1) of the Act, 21 U.S.C. section 360bbb-3(b)(1), unless the authorization is terminated or revoked.  Performed at [facility], [place] [address], [place], [place] [address]   Culture, blood (routine x 2)     Status: Abnormal   Collection Time: 07/13/20 12:20 PM   Specimen: BLOOD  Result Value Ref Range Status   Specimen Description BLOOD SITE NOT SPECIFIED  Final   Special Requests   Final    BOTTLES DRAWN AEROBIC AND ANAEROBIC Blood Culture adequate volume   Culture  Setup Time   Final    GRAM POSITIVE COCCI IN CHAINS IN BOTH AEROBIC AND ANAEROBIC BOTTLES Organism ID to follow CRITICAL RESULT CALLED TO, READ BACK BY AND VERIFIED WITH: Performed at [facility], [place] [address] [place], [place] [address]    Culture METHICILLIN RESISTANT STAPHYLOCOCCUS AUREUS (A)  Final   Report Status 07/16/2020 FINAL  Final   Organism ID, Bacteria METHICILLIN RESISTANT STAPHYLOCOCCUS AUREUS  Final      Susceptibility   Methicillin resistant staphylococcus  aureus - MIC*    CIPROFLOXACIN <=0.5 SENSITIVE Sensitive     ERYTHROMYCIN >=8 RESISTANT Resistant     GENTAMICIN <=0.5 SENSITIVE Sensitive     OXACILLIN >=4 RESISTANT Resistant     TETRACYCLINE <=1 SENSITIVE Sensitive     VANCOMYCIN <=0.5 SENSITIVE Sensitive     TRIMETH/SULFA <=10 SENSITIVE Sensitive     CLINDAMYCIN <=0.25 SENSITIVE Sensitive     RIFAMPIN <=0.5 SENSITIVE Sensitive     Inducible Clindamycin NEGATIVE Sensitive     * METHICILLIN RESISTANT STAPHYLOCOCCUS AUREUS  Blood Culture ID Panel (Reflexed)     Status: Abnormal   Collection Time: 07/13/20 12:20 PM  Result Value Ref Range Status   Enterococcus faecalis NOT DETECTED NOT DETECTED Final   Enterococcus Faecium NOT DETECTED NOT DETECTED Final   Listeria monocytogenes NOT DETECTED NOT DETECTED Final   Staphylococcus species DETECTED (A) NOT DETECTED Final    Comment: CRITICAL RESULT CALLED TO, READ BACK BY AND VERIFIED WITH: PHARMD AMEND CAREN BY MESSAN H. AT 0120 ON 07/14/2020    Staphylococcus aureus (BCID) DETECTED (A) NOT DETECTED Final    Comment: Methicillin (oxacillin)-resistant Staphylococcus aureus (MRSA). MRSA is predictably resistant to beta-lactam antibiotics (except ceftaroline). Preferred therapy is vancomycin unless clinically contraindicated. Patient requires contact precautions if  hospitalized. CRITICAL RESULT CALLED TO, READ BACK BY AND VERIFIED WITH: PHARMD AMEND CAREN BY MESSAN H. AT 0120 ON 07/14/2020    Staphylococcus epidermidis NOT DETECTED NOT DETECTED Final   Staphylococcus lugdunensis NOT DETECTED NOT DETECTED Final   Streptococcus species NOT DETECTED NOT DETECTED Final   Streptococcus agalactiae NOT DETECTED NOT DETECTED Final   Streptococcus pneumoniae NOT DETECTED NOT DETECTED Final   Streptococcus pyogenes NOT DETECTED NOT DETECTED Final   A.calcoaceticus-baumannii NOT DETECTED NOT DETECTED Final   Bacteroides fragilis NOT DETECTED NOT DETECTED Final   Enterobacterales NOT DETECTED NOT  DETECTED Final   Enterobacter cloacae complex NOT DETECTED NOT DETECTED Final   Escherichia coli NOT DETECTED NOT DETECTED Final   Klebsiella aerogenes NOT DETECTED NOT DETECTED Final   Klebsiella oxytoca NOT DETECTED NOT DETECTED Final   Klebsiella pneumoniae NOT DETECTED NOT DETECTED Final   Proteus species NOT DETECTED NOT DETECTED Final   Salmonella species NOT DETECTED NOT DETECTED Final   Serratia marcescens NOT DETECTED NOT DETECTED Final   Haemophilus influenzae NOT DETECTED NOT DETECTED Final  Neisseria meningitidis NOT DETECTED NOT DETECTED Final   Pseudomonas aeruginosa NOT DETECTED NOT DETECTED Final   Stenotrophomonas maltophilia NOT DETECTED NOT DETECTED Final   Candida albicans NOT DETECTED NOT DETECTED Final   Candida auris NOT DETECTED NOT DETECTED Final   Candida glabrata NOT DETECTED NOT DETECTED Final   Candida krusei NOT DETECTED NOT DETECTED Final   Candida parapsilosis NOT DETECTED NOT DETECTED Final   Candida tropicalis NOT DETECTED NOT DETECTED Final   Cryptococcus neoformans/gattii NOT DETECTED NOT DETECTED Final   Meth resistant mecA/C and MREJ DETECTED (A) NOT DETECTED Final    Comment: CRITICAL RESULT CALLED TO, READ BACK BY AND VERIFIED WITH: PHARMD AMEND CAREN BY MESSAN H. AT 0120 ON 07/14/2020 Performed at [facility] [address], [place], [place] [address]   Culture, blood (routine x 2)     Status: Abnormal   Collection Time: 07/13/20 12:21 PM   Specimen: BLOOD  Result Value Ref Range Status   Specimen Description BLOOD SITE NOT SPECIFIED  Final   Special Requests   Final    BOTTLES DRAWN AEROBIC AND ANAEROBIC Blood Culture adequate volume   Culture  Setup Time   Final    GRAM POSITIVE COCCI IN BOTH AEROBIC AND ANAEROBIC BOTTLES CRITICAL VALUE NOTED.  VALUE IS CONSISTENT WITH PREVIOUSLY REPORTED AND CALLED VALUE.    Culture (A)  Final    STAPHYLOCOCCUS AUREUS SUSCEPTIBILITIES PERFORMED ON PREVIOUS CULTURE WITHIN THE LAST 5  DAYS. Performed at [facility], [place] [address], [place], [place] [address]    Report Status 07/16/2020 FINAL  Final  MRSA PCR Screening     Status: Abnormal   Collection Time: 07/13/20  8:37 PM   Specimen: Nasal Mucosa; Nasopharyngeal  Result Value Ref Range Status   MRSA by PCR POSITIVE (A) NEGATIVE Final    Comment:        The GeneXpert MRSA Assay (FDA approved for NASAL specimens only), is one component of a comprehensive MRSA colonization surveillance program. It is not intended to diagnose MRSA infection nor to guide or monitor treatment for MRSA infections. RESULT CALLED TO, READ BACK BY AND VERIFIED WITH: TOLER,M RN 07/13/2020 AT 2319 SKEEN,P Performed at [facility], [place] [address], [place], [place] [address]   Urine culture     Status: None   Collection Time: 07/13/20 10:10 PM   Specimen: Urine, Clean Catch  Result Value Ref Range Status   Specimen Description URINE, CLEAN CATCH  Final   Special Requests NONE  Final   Culture   Final    NO GROWTH Performed at [facility], [address] [place], [place] [address]    Report Status 07/15/2020 FINAL  Final  Culture, blood (routine x 2)     Status: None (Preliminary result)   Collection Time: 07/15/20  6:49 PM   Specimen: BLOOD  Result Value Ref Range Status   Specimen Description BLOOD RIGHT ANTECUBITAL  Final   Special Requests   Final    BOTTLES DRAWN AEROBIC AND ANAEROBIC BACTEROIDES CACCAE   Culture   Final    NO GROWTH 3 DAYS Performed at [facility], [place] [address] [place], [place] [address]    Report Status PENDING  Incomplete  Culture, blood (routine x 2)     Status: None (Preliminary result)   Collection Time: 07/15/20  6:50 PM   Specimen: BLOOD  Result Value Ref Range Status   Specimen Description BLOOD BLOOD RIGHT FOREARM  Final   Special Requests   Final    BOTTLES DRAWN AEROBIC AND ANAEROBIC Blood Culture adequate volume   Culture   Final    NO GROWTH 3  DAYS Performed at [facility], [address] [place], [place] [address]    Report Status PENDING  Incomplete     Terri Piedra, [facility]  07/19/2020  1:24 PM

## 2020-07-19 NOTE — Progress Notes (Signed)
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 -Current active IV heroin user uses about 3 g daily and IV fentanyl, presented to the ED with 3 to 4-week history of bilateral lower extremity edema associated with worsening dyspnea on exertion for 4 days, intermittent fevers for several weeks and pain with deep inspiration across her right lateral chest and flank. -In the ED he was noted to be fluid overloaded in addition blood cultures were positive for MRSA and she was started on IV vancomycin. -ID, cardiology and OB following  Assessment & Plan:  MRSA bacteremia Presumed endocarditis -Continue IV vancomycin, ID following.  -2D echocardiogram without obvious vegetation, TEE deferred, plan to empirically treat as endocarditis.  -Previously treated for Enterococcal tricuspid valve endocarditis in 2020 -Repeat blood cultures negative so far -Anticipate she will need 6 weeks of IV antibiotics inpatient -MRI LS spine per ID recommendations  Acute on chronic Right heart failure Severe TR Severe pulmonary hypertension -2D echo noted decreased RV systolic function, elevated PA pressures 92, severe TR -Volume status improving, she is -11.6 L -Heart failure service following  Chronic anemia -Hemoglobin at baseline from 8.5-9.5, now trending down -She denies any overt bleeding, no vaginal bleeding noted. -Anemia panel; B 12 445, Folate 7.4, iron 32, ferritin 112. -Continue to monitor  IV heroin and fentanyl abuse Chronic pain -Reports that she uses 3 g of IV heroin daily and additional fentanyl -Increased methadone to  28m 3 times daily, QTC is normal -Also on IV Dilaudid at this time -Continue with laxatives  26-week pregnancy -OB service following, getting daily fetal heart assessment -Prenatal -OB following.   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   DVT prophylaxis: Lovenox Code Status: Full code Family Communication: Patient denies having any family contacts    Consultants:  Cardiology, ID  Procedures:   Antimicrobials: IV Vanc   Subjective: -Overall a little better, continues to have right flank discomfort, + orthopnea  Objective: Vitals:   07/18/20 2049 07/18/20 2338 07/19/20 0519 07/19/20 0814  BP: 127/81 125/87 126/80 (!) 117/91  Pulse:   93 83  Resp: 18 (!) 22 16 16   Temp: (!) 97.5 F (36.4 C) 97.8 F (36.6 C) 97.8 F (36.6 C) 97.9 F (36.6 C)  TempSrc: Oral  Oral Oral  SpO2:   97% 98%  Weight:   84.4 kg   Height:        Intake/Output Summary (Last 24 hours) at 07/19/2020 1225 Last data filed at 07/19/2020 00300Gross per 24 hour  Intake 565 ml  Output 710 ml  Net -145 ml   Filed Weights   07/16/20 0426 07/17/20 0431 07/19/20 0519  Weight: 92.8 kg 91.8 kg 84.4 kg    Examination:  General exam: Young female, laying in bed, times, uncomfortable. HEENT: Positive JVD CVS: S1-S2, regular rate rhythm, systolic murmur Lungs: Decreased breath sounds the bases Abdomen: Soft, nontender, gravid uterus Extremities: 1+ edema  Skin: Extensive track marks   Data  Reviewed:   CBC: Recent Labs  Lab 07/13/20 1035 07/13/20 2119 07/14/20 0021 07/14/20 0157 07/16/20 0333 07/17/20 0622 07/18/20 0500  WBC 19.1*  --   --  20.4* 13.0* 11.8* 12.5*  HGB 9.4*   < > 8.4* 8.4* 8.3* 7.5* 8.4*  HCT 28.5*   < > 25.3* 24.4* 26.2* 23.6* 26.5*  MCV 93.8  --   --  90.4  94.6 94.4 94.3  PLT 322  --   --  238 301 299 317   < > = values in this interval not displayed.   Basic Metabolic Panel: Recent Labs  Lab 07/14/20 0157 07/14/20 1947 07/15/20 0416 07/16/20 0333 07/17/20 0622 07/18/20 0500  NA 129*  --  132* 132* 133* 131*  K 3.4*  --  3.9 4.1 4.0 3.8  CL 96*  --  96* 96* 97* 93*  CO2 19*  --  26 29 27 27   GLUCOSE 103*  --  88 82 81 95  BUN 7  --  9 7 7 8   CREATININE 0.54  --  0.50 0.51 0.47 0.52  CALCIUM 8.2*  --  8.4* 8.4* 8.2* 8.6*  MG 1.2* 1.8 1.6*  --   --   --   PHOS 3.2  --  2.4*  --   --   --    GFR: Estimated Creatinine Clearance: 110.6 mL/min (by C-G formula based on SCr of 0.52 mg/dL). Liver Function Tests: Recent Labs  Lab 07/13/20 1035 07/13/20 2110 07/16/20 0333 07/17/20 0622  AST 18 14* 11* 11*  ALT 7 6 6  <5  ALKPHOS 138* 128* 130* 163*  BILITOT 1.1 1.3* 0.6 0.7  PROT 7.1 6.3* 6.0* 6.1*  ALBUMIN 2.3* 2.0* 1.7* 1.7*   Recent Labs  Lab 07/13/20 1035  LIPASE 19   No results for input(s): AMMONIA in the last 168 hours. Coagulation Profile: No results for input(s): INR, PROTIME in the last 168 hours. Cardiac Enzymes: Recent Labs  Lab 07/13/20 1219  CKTOTAL 68   BNP (last 3 results) No results for input(s): PROBNP in the last 8760 hours. HbA1C: No results for input(s): HGBA1C in the last 72 hours. CBG: Recent Labs  Lab 07/13/20 2039  GLUCAP 108*   Lipid Profile: No results for input(s): CHOL, HDL, LDLCALC, TRIG, CHOLHDL, LDLDIRECT in the last 72 hours. Thyroid Function Tests: No results for input(s): TSH, T4TOTAL, FREET4, T3FREE, THYROIDAB in the last 72 hours. Anemia Panel: Recent Labs    07/17/20 1520 07/17/20 1530  VITAMINB12  --  445  FOLATE  --  7.4  FERRITIN  --  112  TIBC  --  365  IRON  --  32  RETICCTPCT 2.2  --    Urine analysis:    Component Value Date/Time   COLORURINE AMBER (A) 07/13/2020 1320   APPEARANCEUR CLOUDY (A) 07/13/2020 1320   APPEARANCEUR Hazy 07/10/2013 1121    LABSPEC 1.028 07/13/2020 1320   LABSPEC 1.020 07/10/2013 1121   PHURINE 5.0 07/13/2020 1320   GLUCOSEU NEGATIVE 07/13/2020 1320   GLUCOSEU Negative 07/10/2013 1121   HGBUR SMALL (A) 07/13/2020 1320   BILIRUBINUR NEGATIVE 07/13/2020 1320   BILIRUBINUR Negative 07/10/2013 1121   KETONESUR 5 (A) 07/13/2020 1320   PROTEINUR 100 (A) 07/13/2020 1320   UROBILINOGEN 1.0 02/15/2015 0530   NITRITE POSITIVE (A) 07/13/2020 1320   LEUKOCYTESUR LARGE (A) 07/13/2020 1320   LEUKOCYTESUR Trace 07/10/2013 1121   Sepsis Labs: @LABRCNTIP (procalcitonin:4,lacticidven:4)  ) Recent Results (from the past 240 hour(s))  Resp Panel  by RT-PCR (Flu A&B, Covid) Nasopharyngeal Swab     Status: None   Collection Time: 07/13/20 11:21 AM   Specimen: Nasopharyngeal Swab; Nasopharyngeal(NP) swabs in vial transport medium  Result Value Ref Range Status   SARS Coronavirus 2 by RT PCR NEGATIVE NEGATIVE Final    Comment: (NOTE) SARS-CoV-2 target nucleic acids are NOT DETECTED.  The SARS-CoV-2 RNA is generally detectable in upper respiratory specimens during the acute phase of infection. The lowest concentration of SARS-CoV-2 viral copies this assay can detect is 138 copies/mL. A negative result does not preclude SARS-Cov-2 infection and should not be used as the sole basis for treatment or other patient management decisions. A negative result may occur with  improper specimen collection/handling, submission of specimen other than nasopharyngeal swab, presence of viral mutation(s) within the areas targeted by this assay, and inadequate number of viral copies(<138 copies/mL). A negative result must be combined with clinical observations, patient history, and epidemiological information. The expected result is Negative.  Fact Sheet for Patients:  EntrepreneurPulse.com.au  Fact Sheet for Healthcare Providers:  IncredibleEmployment.be  This test is no t yet approved or cleared by  the [place] FDA and  has been authorized for detection and/or diagnosis of SARS-CoV-2 by FDA under an Emergency Use Authorization (EUA). This EUA will remain  in effect (meaning this test can be used) for the duration of the COVID-19 declaration under Section 564(b)(1) of the Act, 21 U.S.C.section 360bbb-3(b)(1), unless the authorization is terminated  or revoked sooner.       Influenza A by PCR NEGATIVE NEGATIVE Final   Influenza B by PCR NEGATIVE NEGATIVE Final    Comment: (NOTE) The Xpert Xpress SARS-CoV-2/FLU/RSV plus assay is intended as an aid in the diagnosis of influenza from Nasopharyngeal swab specimens and should not be used as a sole basis for treatment. Nasal washings and aspirates are unacceptable for Xpert Xpress SARS-CoV-2/FLU/RSV testing.  Fact Sheet for Patients: EntrepreneurPulse.com.au  Fact Sheet for Healthcare Providers: IncredibleEmployment.be  This test is not yet approved or cleared by the [place] FDA and has been authorized for detection and/or diagnosis of SARS-CoV-2 by FDA under an Emergency Use Authorization (EUA). This EUA will remain in effect (meaning this test can be used) for the duration of the COVID-19 declaration under Section 564(b)(1) of the Act, 21 U.S.C. section 360bbb-3(b)(1), unless the authorization is terminated or revoked.  Performed at [facility], [place] [address], [place], [place] [address]   Culture, blood (routine x 2)     Status: Abnormal   Collection Time: 07/13/20 12:20 PM   Specimen: BLOOD  Result Value Ref Range Status   Specimen Description BLOOD SITE NOT SPECIFIED  Final   Special Requests   Final    BOTTLES DRAWN AEROBIC AND ANAEROBIC Blood Culture adequate volume   Culture  Setup Time   Final    GRAM POSITIVE COCCI IN CHAINS IN BOTH AEROBIC AND ANAEROBIC BOTTLES Organism ID to follow CRITICAL RESULT CALLED TO, READ BACK BY AND VERIFIED WITH: Performed at  [facility], [place] [address], [place],  [address]    Culture METHICILLIN RESISTANT STAPHYLOCOCCUS AUREUS (A)  Final   Report Status 07/16/2020 FINAL  Final   Organism ID, Bacteria METHICILLIN RESISTANT STAPHYLOCOCCUS AUREUS  Final      Susceptibility   Methicillin resistant staphylococcus aureus - MIC*    CIPROFLOXACIN <=0.5 SENSITIVE Sensitive     ERYTHROMYCIN >=8 RESISTANT Resistant     GENTAMICIN <=0.5 SENSITIVE Sensitive  OXACILLIN >=4 RESISTANT Resistant     TETRACYCLINE <=1 SENSITIVE Sensitive     VANCOMYCIN <=0.5 SENSITIVE Sensitive     TRIMETH/SULFA <=10 SENSITIVE Sensitive     CLINDAMYCIN <=0.25 SENSITIVE Sensitive     RIFAMPIN <=0.5 SENSITIVE Sensitive     Inducible Clindamycin NEGATIVE Sensitive     * METHICILLIN RESISTANT STAPHYLOCOCCUS AUREUS  Blood Culture ID Panel (Reflexed)     Status: Abnormal   Collection Time: 07/13/20 12:20 PM  Result Value Ref Range Status   Enterococcus faecalis NOT DETECTED NOT DETECTED Final   Enterococcus Faecium NOT DETECTED NOT DETECTED Final   Listeria monocytogenes NOT DETECTED NOT DETECTED Final   Staphylococcus species DETECTED (A) NOT DETECTED Final    Comment: CRITICAL RESULT CALLED TO, READ BACK BY AND VERIFIED WITH: PHARMD AMEND CAREN BY MESSAN H. AT 0120 ON 07/14/2020    Staphylococcus aureus (BCID) DETECTED (A) NOT DETECTED Final    Comment: Methicillin (oxacillin)-resistant Staphylococcus aureus (MRSA). MRSA is predictably resistant to beta-lactam antibiotics (except ceftaroline). Preferred therapy is vancomycin unless clinically contraindicated. Patient requires contact precautions if  hospitalized. CRITICAL RESULT CALLED TO, READ BACK BY AND VERIFIED WITH: PHARMD AMEND CAREN BY MESSAN H. AT 0120 ON 07/14/2020    Staphylococcus epidermidis NOT DETECTED NOT DETECTED Final   Staphylococcus lugdunensis NOT DETECTED NOT DETECTED Final   Streptococcus species NOT DETECTED NOT DETECTED Final   Streptococcus agalactiae  NOT DETECTED NOT DETECTED Final   Streptococcus pneumoniae NOT DETECTED NOT DETECTED Final   Streptococcus pyogenes NOT DETECTED NOT DETECTED Final   A.calcoaceticus-baumannii NOT DETECTED NOT DETECTED Final   Bacteroides fragilis NOT DETECTED NOT DETECTED Final   Enterobacterales NOT DETECTED NOT DETECTED Final   Enterobacter cloacae complex NOT DETECTED NOT DETECTED Final   Escherichia coli NOT DETECTED NOT DETECTED Final   Klebsiella aerogenes NOT DETECTED NOT DETECTED Final   Klebsiella oxytoca NOT DETECTED NOT DETECTED Final   Klebsiella pneumoniae NOT DETECTED NOT DETECTED Final   Proteus species NOT DETECTED NOT DETECTED Final   Salmonella species NOT DETECTED NOT DETECTED Final   Serratia marcescens NOT DETECTED NOT DETECTED Final   Haemophilus influenzae NOT DETECTED NOT DETECTED Final   Neisseria meningitidis NOT DETECTED NOT DETECTED Final   Pseudomonas aeruginosa NOT DETECTED NOT DETECTED Final   Stenotrophomonas maltophilia NOT DETECTED NOT DETECTED Final   Candida albicans NOT DETECTED NOT DETECTED Final   Candida auris NOT DETECTED NOT DETECTED Final   Candida glabrata NOT DETECTED NOT DETECTED Final   Candida krusei NOT DETECTED NOT DETECTED Final   Candida parapsilosis NOT DETECTED NOT DETECTED Final   Candida tropicalis NOT DETECTED NOT DETECTED Final   Cryptococcus neoformans/gattii NOT DETECTED NOT DETECTED Final   Meth resistant mecA/C and MREJ DETECTED (A) NOT DETECTED Final    Comment: CRITICAL RESULT CALLED TO, READ BACK BY AND VERIFIED WITH: PHARMD AMEND CAREN BY MESSAN H. AT 0120 ON 07/14/2020 Performed at [facility], [address] [place], [place] [address]   Culture, blood (routine x 2)     Status: Abnormal   Collection Time: 07/13/20 12:21 PM   Specimen: BLOOD  Result Value Ref Range Status   Specimen Description BLOOD SITE NOT SPECIFIED  Final   Special Requests   Final    BOTTLES DRAWN AEROBIC AND ANAEROBIC Blood Culture adequate volume    Culture  Setup Time   Final    GRAM POSITIVE COCCI IN BOTH AEROBIC AND ANAEROBIC BOTTLES CRITICAL VALUE NOTED.  VALUE IS CONSISTENT WITH  PREVIOUSLY REPORTED AND CALLED VALUE.    Culture (A)  Final    STAPHYLOCOCCUS AUREUS SUSCEPTIBILITIES PERFORMED ON PREVIOUS CULTURE WITHIN THE LAST 5 DAYS. Performed at [facility], [place] [address], [place], [place] [address]    Report Status 07/16/2020 FINAL  Final  MRSA PCR Screening     Status: Abnormal   Collection Time: 07/13/20  8:37 PM   Specimen: Nasal Mucosa; Nasopharyngeal  Result Value Ref Range Status   MRSA by PCR POSITIVE (A) NEGATIVE Final    Comment:        The GeneXpert MRSA Assay (FDA approved for NASAL specimens only), is one component of a comprehensive MRSA colonization surveillance program. It is not intended to diagnose MRSA infection nor to guide or monitor treatment for MRSA infections. RESULT CALLED TO, READ BACK BY AND VERIFIED WITH: TOLER,M RN 07/13/2020 AT 2319 SKEEN,P Performed at [facility], [place] [address] [place], [place] [address]   Urine culture     Status: None   Collection Time: 07/13/20 10:10 PM   Specimen: Urine, Clean Catch  Result Value Ref Range Status   Specimen Description URINE, CLEAN CATCH  Final   Special Requests NONE  Final   Culture   Final    NO GROWTH Performed at [facility], [address], [place], [place] [address]    Report Status 07/15/2020 FINAL  Final  Culture, blood (routine x 2)     Status: None (Preliminary result)   Collection Time: 07/15/20  6:49 PM   Specimen: BLOOD  Result Value Ref Range Status   Specimen Description BLOOD RIGHT ANTECUBITAL  Final   Special Requests   Final    BOTTLES DRAWN AEROBIC AND ANAEROBIC BACTEROIDES CACCAE   Culture   Final    NO GROWTH 3 DAYS Performed at [facility], [place] [address] [place], [place] [address]    Report Status PENDING  Incomplete  Culture, blood (routine x 2)     Status: None (Preliminary  result)   Collection Time: 07/15/20  6:50 PM   Specimen: BLOOD  Result Value Ref Range Status   Specimen Description BLOOD BLOOD RIGHT FOREARM  Final   Special Requests   Final    BOTTLES DRAWN AEROBIC AND ANAEROBIC Blood Culture adequate volume   Culture   Final    NO GROWTH 3 DAYS Performed at [facility], [place] [address], [place],  [address]    Report Status PENDING  Incomplete    Radiology Studies: US RENAL  Result Date: 07/17/2020 CLINICAL DATA:  Bilateral flank pain.  Substance abuse. EXAM: RENAL / URINARY TRACT ULTRASOUND COMPLETE COMPARISON:  None. FINDINGS: Right Kidney: Renal measurements: 11.2 x 4.3 x 5.9 cm = volume: 145 mL. Echogenicity within normal limits. No mass or hydronephrosis visualized. Left Kidney: Renal measurements: 12.5 x 5.9 x 5.1 cm = volume: 196.3 mL. Echogenicity within normal limits. No mass or hydronephrosis visualized. Bladder: Not visualized. Other: None. IMPRESSION: 1. The kidneys are normal in appearance. The mild right hydronephrosis seen on an abdominal ultrasound from July 13, 2020 is not visualized today. Electronically Signed   By: Dorise Bullion III M.D   On: 07/17/2020 14:53   Scheduled Meds: . sodium chloride   Intravenous Once  . acetaminophen  650 mg Oral Q6H  . Chlorhexidine Gluconate Cloth  6 each Topical Daily  . enoxaparin (LOVENOX) injection  40 mg Subcutaneous Q24H  . furosemide  40 mg Intravenous BID  .  methadone  30 mg Oral TID PC  . prenatal vitamin w/FE, FA  1 tablet Oral Q1200  . senna-docusate  1 tablet Oral BID   Continuous Infusions: . vancomycin 750 mg (07/19/20 0923)     LOS: 6 days    Time spent: 64mn  PDomenic Polite MD [facility] 07/19/2020, 12:25 PM

## 2020-07-19 NOTE — Progress Notes (Signed)
Patient reports feeling good fetal movement.  No bleeding, leaking or abdominal pain noted at this time.  FHT's dopplered in 140's-150's.

## 2020-07-19 NOTE — Progress Notes (Addendum)
Advanced Heart Failure Rounding Note   Subjective:    Remains fluid overloaded and SOB. Cant lay flat.   Complaining of rib pain and LBP. ? related to septic embolization to her pregnancy vs lumbar spine disease. Plan MRI once able to lay flat.   Objective:   Weight Range:  Vital Signs:   Temp:  [97.5 F (36.4 C)-98.1 F (36.7 C)] 97.9 F (36.6 C) (03/08 0814) Pulse Rate:  [83-95] 83 (03/08 0814) Resp:  [16-22] 16 (03/08 0814) BP: (117-149)/(80-91) 117/91 (03/08 0814) SpO2:  [97 %-98 %] 98 % (03/08 0814) Weight:  [84.4 kg] 84.4 kg (03/08 0519) Last BM Date: 07/14/20  Weight change: Filed Weights   07/16/20 0426 07/17/20 0431 07/19/20 0519  Weight: 92.8 kg 91.8 kg 84.4 kg    Intake/Output:   Intake/Output Summary (Last 24 hours) at 07/19/2020 1057 Last data filed at 07/19/2020 0838 Gross per 24 hour  Intake 565 ml  Output 710 ml  Net -145 ml     PHYSICAL EXAM: General:  Looks much older than actual age. No respiratory difficulty HEENT: dentition in poor repair, otherwise normal Neck: supple. JVD elevated to jaw. Carotids 2+ bilat; no bruits. No lymphadenopathy or thyromegaly appreciated. Cor: PMI nondisplaced. Regular rate & rhythm. No rubs, gallops + 2/6 TR murmur Lungs: decreased BS at the bases bilaterally  Abdomen: soft, nontender, nondistended. No hepatosplenomegaly. No bruits or masses. Good bowel sounds. Extremities: no cyanosis, clubbing, rash, edema Neuro: alert & oriented x 3, cranial nerves grossly intact. moves all 4 extremities w/o difficulty. Affect pleasant.   Telemetry: NSR 90s    Labs: Basic Metabolic Panel: Recent Labs  Lab 07/14/20 0157 07/14/20 1947 07/15/20 0416 07/16/20 0333 07/17/20 0622 07/18/20 0500  NA 129*  --  132* 132* 133* 131*  K 3.4*  --  3.9 4.1 4.0 3.8  CL 96*  --  96* 96* 97* 93*  CO2 19*  --  26 29 27 27   GLUCOSE 103*  --  88 82 81 95  BUN 7  --  9 7 7 8   CREATININE 0.54  --  0.50 0.51 0.47 0.52  CALCIUM 8.2*   --  8.4* 8.4* 8.2* 8.6*  MG 1.2* 1.8 1.6*  --   --   --   PHOS 3.2  --  2.4*  --   --   --     Liver Function Tests: Recent Labs  Lab 07/13/20 1035 07/13/20 2110 07/16/20 0333 07/17/20 0622  AST 18 14* 11* 11*  ALT 7 6 6  <5  ALKPHOS 138* 128* 130* 163*  BILITOT 1.1 1.3* 0.6 0.7  PROT 7.1 6.3* 6.0* 6.1*  ALBUMIN 2.3* 2.0* 1.7* 1.7*   Recent Labs  Lab 07/13/20 1035  LIPASE 19   No results for input(s): AMMONIA in the last 168 hours.  CBC: Recent Labs  Lab 07/13/20 1035 07/13/20 2119 07/14/20 0021 07/14/20 0157 07/16/20 0333 07/17/20 0622 07/18/20 0500  WBC 19.1*  --   --  20.4* 13.0* 11.8* 12.5*  HGB 9.4*   < > 8.4* 8.4* 8.3* 7.5* 8.4*  HCT 28.5*   < > 25.3* 24.4* 26.2* 23.6* 26.5*  MCV 93.8  --   --  90.4 94.6 94.4 94.3  PLT 322  --   --  238 301 299 317   < > = values in this interval not displayed.    Cardiac Enzymes: Recent Labs  Lab 07/13/20 1219  CKTOTAL 68    BNP: BNP (last  3 results) Recent Labs    07/13/20 1219  BNP 157.1*    ProBNP (last 3 results) No results for input(s): PROBNP in the last 8760 hours.    Other results:  Imaging: US RENAL  Result Date: 07/17/2020 CLINICAL DATA:  Bilateral flank pain.  Substance abuse. EXAM: RENAL / URINARY TRACT ULTRASOUND COMPLETE COMPARISON:  None. FINDINGS: Right Kidney: Renal measurements: 11.2 x 4.3 x 5.9 cm = volume: 145 mL. Echogenicity within normal limits. No mass or hydronephrosis visualized. Left Kidney: Renal measurements: 12.5 x 5.9 x 5.1 cm = volume: 196.3 mL. Echogenicity within normal limits. No mass or hydronephrosis visualized. Bladder: Not visualized. Other: None. IMPRESSION: 1. The kidneys are normal in appearance. The mild right hydronephrosis seen on an abdominal ultrasound from July 13, 2020 is not visualized today. Electronically Signed   By: Dorise Bullion III M.D   On: 07/17/2020 14:53     Medications:     Scheduled Medications: . sodium chloride   Intravenous Once  .  acetaminophen  650 mg Oral Q6H  . Chlorhexidine Gluconate Cloth  6 each Topical Daily  . enoxaparin (LOVENOX) injection  40 mg Subcutaneous Q24H  . furosemide  40 mg Intravenous BID  . methadone  30 mg Oral TID PC  . prenatal vitamin w/FE, FA  1 tablet Oral Q1200  . senna-docusate  1 tablet Oral BID    Infusions: . vancomycin 750 mg (07/19/20 0923)    PRN Medications: HYDROmorphone (DILAUDID) injection, polyethylene glycol, prochlorperazine, sodium chloride flush   Assessment/Plan:   1. MRSA Sepsis  - bcx 2/2 + for MRSA - clinically improved with IV abx and volume resuscitation.  - ID has seen and adjusting abx. Will need 6 weeks.  - Bld Cx 3/4 - No growth.  - no obvious vegetation on valve on TTE but assume she has recurrent endocarditis and would treat for 6 weeks with IV abx. D/w ID and both agree that TEE would not change management at this point as valve known to be heavily damaged from previous episodes and currently not candidate for operative repair.  - Given ongoing IVDA will need to understand strategy of how she will get 6 weeks of IV abx safely. Is plan for SNF or LTAC?   2. Acute on Chronic Systolic CHF with Mostly Right Sided CHF (Likely Secondary to Severe TR) - Echo 07/13/20 showed normalization of LV function  LVEF of 55% with normal wall motion. RV severely enlarged with moderately reduced systolic function and severely elevated PASP of 92.43mHg. Severe TR noted but no vegetation visualized.  - Volume overloaded on exam w/ increased dyspnea/ orthopnea - Give IV Lasix 40 mg bid + supp KCl    3. 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 He was seen by Dr. GServando Snarein 05/2019 for possible surgical repair. However, patient was lost to follow-up and unfortunately started using drugs again. - Echo this admission shows severe TR but no signs of  vegetation.  4. 26+w pregnant - u/s ok - OB following -Per Dr BHaroldine Laws- our local high-risk OB team would be happy to manage pregnancy here from my standpoint. Will d/w rest of team  5. RPR + - ID sent confirmatory test - Given penicillin.   6. Polysubstance Abuse -Had using IV heroine + fentanyl - On methadone.   7.Anemia  -Hgb 8.4  -No bleeding noted.    Length of  Stay: [place] PA-C  07/19/2020, 10:57 AM  Advanced Heart Failure Team Pager [phone] (M-F; 7a - 4p)  Please contact [facility] for night-coverage after hours (4p -7a ) and weekends on [email or web address]  Patient seen and examined with the above-signed Advanced Practice Provider and/or Housestaff. I personally reviewed laboratory data, imaging studies and relevant notes. I independently examined the patient and formulated the important aspects of the plan. I have edited the note to reflect any of my changes or salient points. I have personally discussed the plan with the patient and/or family.  She remains SOB and orthopneic. Also c/o pain. Weight down 18 pounds from admit  General:  Moaning in pain HEENT: normal + poor dentition  Neck: supple. JVP up  Carotids 2+ bilat; no bruits. No lymphadenopathy or thryomegaly appreciated. Cor: PMI nondisplaced. Regular rate & rhythm. 3/6 TR Lungs: clear Abdomen: soft, nontender, + gravid  No hepatosplenomegaly. No bruits or masses. Good bowel sounds. Extremities: no cyanosis, clubbing, rash, 2+ edema Neuro: alert & orientedx3, cranial nerves grossly intact. moves all 4 extremities w/o difficulty. Affect pleasant  Weight is down 18 pounds but still appears volume overloaded. Will continue to diurese carefully particularly in setting of pregnancy. Case d/w high-risk OB team who is following peripherally.   Given ongoing IVDA will need to understand strategy of how she will get 6 weeks of IV abx safely. Is plan for SNF or LTAC?   Glori Bickers, MD  11:46  AM

## 2020-07-19 NOTE — Progress Notes (Signed)
    Reassuring FHR as per RROB RN, no acute OB concerns. Consulted Dr. Tama High, Maternal Fetal Medicine, about antenatal testing and surveillance. He recommended weekly MFM scans and continued daily FHR checks.  Ultrasound ordered for 07/19/2020, last one was on 07/13/2020. Dr. Donalee Citrin will leave any further recommendations after evaluating the scan tomorrow. Appreciate the care of the patient by [facility], Cardiology and ID specialists.   Kelly Schneiders, MD, [place] for [employer], [place]

## 2020-07-20 ENCOUNTER — Inpatient Hospital Stay (HOSPITAL_BASED_OUTPATIENT_CLINIC_OR_DEPARTMENT_OTHER): Payer: Medicaid Other

## 2020-07-20 ENCOUNTER — Encounter (HOSPITAL_COMMUNITY): Payer: Self-pay | Admitting: Pulmonary Disease

## 2020-07-20 DIAGNOSIS — Z3A27 27 weeks gestation of pregnancy: Secondary | ICD-10-CM

## 2020-07-20 DIAGNOSIS — O34219 Maternal care for unspecified type scar from previous cesarean delivery: Secondary | ICD-10-CM | POA: Diagnosis not present

## 2020-07-20 DIAGNOSIS — O99322 Drug use complicating pregnancy, second trimester: Secondary | ICD-10-CM

## 2020-07-20 DIAGNOSIS — O09522 Supervision of elderly multigravida, second trimester: Secondary | ICD-10-CM | POA: Diagnosis not present

## 2020-07-20 DIAGNOSIS — O321XX0 Maternal care for breech presentation, not applicable or unspecified: Secondary | ICD-10-CM

## 2020-07-20 DIAGNOSIS — O99891 Other specified diseases and conditions complicating pregnancy: Secondary | ICD-10-CM | POA: Diagnosis not present

## 2020-07-20 DIAGNOSIS — I38 Endocarditis, valve unspecified: Secondary | ICD-10-CM

## 2020-07-20 DIAGNOSIS — I519 Heart disease, unspecified: Secondary | ICD-10-CM

## 2020-07-20 DIAGNOSIS — O10012 Pre-existing essential hypertension complicating pregnancy, second trimester: Secondary | ICD-10-CM

## 2020-07-20 DIAGNOSIS — I50811 Acute right heart failure: Secondary | ICD-10-CM | POA: Diagnosis not present

## 2020-07-20 DIAGNOSIS — O99412 Diseases of the circulatory system complicating pregnancy, second trimester: Secondary | ICD-10-CM | POA: Diagnosis not present

## 2020-07-20 DIAGNOSIS — Z3A26 26 weeks gestation of pregnancy: Secondary | ICD-10-CM | POA: Diagnosis not present

## 2020-07-20 DIAGNOSIS — B9562 Methicillin resistant Staphylococcus aureus infection as the cause of diseases classified elsewhere: Secondary | ICD-10-CM | POA: Diagnosis not present

## 2020-07-20 DIAGNOSIS — R7881 Bacteremia: Secondary | ICD-10-CM | POA: Diagnosis not present

## 2020-07-20 LAB — CBC
HCT: 29.2 % — ABNORMAL LOW (ref 36.0–46.0)
Hemoglobin: 9.4 g/dL — ABNORMAL LOW (ref 12.0–15.0)
MCH: 29.5 pg (ref 26.0–34.0)
MCHC: 32.2 g/dL (ref 30.0–36.0)
MCV: 91.5 fL (ref 80.0–100.0)
Platelets: 426 10*3/uL — ABNORMAL HIGH (ref 150–400)
RBC: 3.19 MIL/uL — ABNORMAL LOW (ref 3.87–5.11)
RDW: 14.1 % (ref 11.5–15.5)
WBC: 17.3 10*3/uL — ABNORMAL HIGH (ref 4.0–10.5)
nRBC: 0 % (ref 0.0–0.2)

## 2020-07-20 LAB — BASIC METABOLIC PANEL
Anion gap: 12 (ref 5–15)
BUN: 11 mg/dL (ref 6–20)
CO2: 27 mmol/L (ref 22–32)
Calcium: 9.2 mg/dL (ref 8.9–10.3)
Chloride: 90 mmol/L — ABNORMAL LOW (ref 98–111)
Creatinine, Ser: 0.46 mg/dL (ref 0.44–1.00)
GFR, Estimated: >60 mL/min (ref >60)
Glucose, Bld: 85 mg/dL (ref 70–99)
Potassium: 4.5 mmol/L (ref 3.5–5.1)
Sodium: 129 mmol/L — ABNORMAL LOW (ref 135–145)

## 2020-07-20 LAB — CULTURE, BLOOD (ROUTINE X 2)
Culture: NO GROWTH
Culture: NO GROWTH
Special Requests: ADEQUATE

## 2020-07-20 LAB — MAGNESIUM: Magnesium: 1.8 mg/dL (ref 1.7–2.4)

## 2020-07-20 MED ORDER — MAGNESIUM SULFATE 2 GM/50ML IV SOLN
2.0000 g | Freq: Once | INTRAVENOUS | Status: AC
  Administered 2020-07-20: 2 g via INTRAVENOUS
  Filled 2020-07-20: qty 50

## 2020-07-20 NOTE — Progress Notes (Signed)
[facility] for [facility]ious Disease  Date of Admission:  07/13/2020     Total days of antibiotics 8         ASSESSMENT:  Kelly [name] has been unable to tolerate lying flat for MRI. Will discontinue for now and can re-image if symptoms worsen. Blood cultures remain without growth to date. Diuresed another liter of fluid yesterday. WBC count up slightly today and will monitor. Renal function has remained stable with no evidence of nephrotoxicity and vancomycin trough therapeutic. Continue current vancomycin. Will likely need to remain hospitalized for duration of treatment tentatively through 4/8.   PLAN:  1. Continue vancomycin. 2. Discontinue MRI for now - plan to reattempt if symptoms worsen.  3. Monitor cultures for clearance of bacteremia.  4. Pain management / opioid use disorder per primary team.   Principal Problem:   MRSA bacteremia Active Problems:   Tricuspid regurgitation   Opioid use disorder, severe, dependence ([place])   No prenatal care in current pregnancy in second trimester   Community acquired pneumonia   History of bacterial endocarditis   Dental caries   Endocarditis of tricuspid valve   Acute CHF (congestive heart failure) (HCC)   Normal IUP (intrauterine pregnancy) on prenatal ultrasound, third trimester   Cellulitis of right lower extremity   Septic embolism (HCC)   Syphilis   [redacted] weeks gestation of pregnancy   Flank pain   Pain   . sodium chloride   Intravenous Once  . acetaminophen  650 mg Oral Q6H  . Chlorhexidine Gluconate Cloth  6 each Topical Daily  . enoxaparin (LOVENOX) injection  60 mg Subcutaneous Q24H  . furosemide  40 mg Intravenous BID  . methadone  30 mg Oral TID PC  . polyethylene glycol  17 g Oral BID  . prenatal vitamin w/FE, FA  1 tablet Oral Q1200  . senna-docusate  1 tablet Oral BID    SUBJECTIVE:  Afebrile overnight with continued inability to lie flat which is "due to fluid". Unable to tolerate MRI.   Allergies   Allergen Reactions  . Hydrocodone Itching  . Morphine And Related Nausea And Vomiting     Review of Systems: Review of Systems  Constitutional: Negative for chills, fever and weight loss.  Respiratory: Negative for cough, shortness of breath and wheezing.   Cardiovascular: Negative for chest pain and leg swelling.  Gastrointestinal: Negative for abdominal pain, constipation, diarrhea, nausea and vomiting.  Musculoskeletal: Positive for back pain.  Skin: Negative for rash.      OBJECTIVE: Vitals:   07/19/20 1251 07/19/20 1724 07/19/20 2100 07/20/20 0734  BP: (!) 123/91 (!) 121/91 123/84 127/88  Pulse: 89 100 88 72  Resp: 18 19 18 18   Temp: (!) 97.3 F (36.3 C) 97.7 F (36.5 C) 97.7 F (36.5 C) 98.1 F (36.7 C)  TempSrc: Oral Oral Oral Oral  SpO2:   99% 97%  Weight:      Height:       Body mass index is 28.71 kg/m.  Physical Exam Constitutional:      General: She is not in acute distress.    Appearance: She is well-developed and well-nourished.     Comments: Lying in bed in right lateral recumbent position with pillow between her legs; pleasant.   Cardiovascular:     Rate and Rhythm: Normal rate and regular rhythm.     Pulses: Intact distal pulses.     Heart sounds: Normal heart sounds.  Pulmonary:     Effort: Pulmonary  effort is normal.     Breath sounds: Normal breath sounds.  Skin:    General: Skin is warm and dry.  Neurological:     Mental Status: She is alert and oriented to person, place, and time.  Psychiatric:        Mood and Affect: Mood and affect normal.        Behavior: Behavior normal.        Thought Content: Thought content normal.        Judgment: Judgment normal.     Lab Results Lab Results  Component Value Date   WBC 17.3 (H) 07/20/2020   HGB 9.4 (L) 07/20/2020   HCT 29.2 (L) 07/20/2020   MCV 91.5 07/20/2020   PLT 426 (H) 07/20/2020    Lab Results  Component Value Date   CREATININE 0.46 07/20/2020   BUN 11 07/20/2020   NA 129  (L) 07/20/2020   K 4.5 07/20/2020   CL 90 (L) 07/20/2020   CO2 27 07/20/2020    Lab Results  Component Value Date   ALT <5 07/17/2020   AST 11 (L) 07/17/2020   ALKPHOS 163 (H) 07/17/2020   BILITOT 0.7 07/17/2020     Microbiology: Recent Results (from the past 240 hour(s))  Resp Panel by RT-PCR (Flu A&B, Covid) Nasopharyngeal Swab     Status: None   Collection Time: 07/13/20 11:21 AM   Specimen: Nasopharyngeal Swab; Nasopharyngeal(NP) swabs in vial transport medium  Result Value Ref Range Status   SARS Coronavirus 2 by RT PCR NEGATIVE NEGATIVE Final    Comment: (NOTE) SARS-CoV-2 target nucleic acids are NOT DETECTED.  The SARS-CoV-2 RNA is generally detectable in upper respiratory specimens during the acute phase of infection. The lowest concentration of SARS-CoV-2 viral copies this assay can detect is 138 copies/mL. A negative result does not preclude SARS-Cov-2 infection and should not be used as the sole basis for treatment or other patient management decisions. A negative result may occur with  improper specimen collection/handling, submission of specimen other than nasopharyngeal swab, presence of viral mutation(s) within the areas targeted by this assay, and inadequate number of viral copies(<138 copies/mL). A negative result must be combined with clinical observations, patient history, and epidemiological information. The expected result is Negative.  Fact Sheet for Patients:  EntrepreneurPulse.com.au  Fact Sheet for Healthcare Providers:  IncredibleEmployment.be  This test is no t yet approved or cleared by the [place] FDA and  has been authorized for detection and/or diagnosis of SARS-CoV-2 by FDA under an Emergency Use Authorization (EUA). This EUA will remain  in effect (meaning this test can be used) for the duration of the COVID-19 declaration under Section 564(b)(1) of the Act, 21 U.S.C.section 360bbb-3(b)(1), unless  the authorization is terminated  or revoked sooner.       Influenza A by PCR NEGATIVE NEGATIVE Final   Influenza B by PCR NEGATIVE NEGATIVE Final    Comment: (NOTE) The Xpert Xpress SARS-CoV-2/FLU/RSV plus assay is intended as an aid in the diagnosis of influenza from Nasopharyngeal swab specimens and should not be used as a sole basis for treatment. Nasal washings and aspirates are unacceptable for Xpert Xpress SARS-CoV-2/FLU/RSV testing.  Fact Sheet for Patients: EntrepreneurPulse.com.au  Fact Sheet for Healthcare Providers: IncredibleEmployment.be  This test is not yet approved or cleared by the [place] FDA and has been authorized for detection and/or diagnosis of SARS-CoV-2 by FDA under an Emergency Use Authorization (EUA). This EUA will remain in effect (meaning this test can be  used) for the duration of the COVID-19 declaration under Section 564(b)(1) of the Act, 21 U.S.C. section 360bbb-3(b)(1), unless the authorization is terminated or revoked.  Performed at [facility], [place] [address] [place], [place] [address]   Culture, blood (routine x 2)     Status: Abnormal   Collection Time: 07/13/20 12:20 PM   Specimen: BLOOD  Result Value Ref Range Status   Specimen Description BLOOD SITE NOT SPECIFIED  Final   Special Requests   Final    BOTTLES DRAWN AEROBIC AND ANAEROBIC Blood Culture adequate volume   Culture  Setup Time   Final    GRAM POSITIVE COCCI IN CHAINS IN BOTH AEROBIC AND ANAEROBIC BOTTLES Organism ID to follow CRITICAL RESULT CALLED TO, READ BACK BY AND VERIFIED WITH: Performed at [facility], [place] [address] [place], [place] [address]    Culture METHICILLIN RESISTANT STAPHYLOCOCCUS AUREUS (A)  Final   Report Status 07/16/2020 FINAL  Final   Organism ID, Bacteria METHICILLIN RESISTANT STAPHYLOCOCCUS AUREUS  Final      Susceptibility   Methicillin resistant staphylococcus aureus - MIC*     CIPROFLOXACIN <=0.5 SENSITIVE Sensitive     ERYTHROMYCIN >=8 RESISTANT Resistant     GENTAMICIN <=0.5 SENSITIVE Sensitive     OXACILLIN >=4 RESISTANT Resistant     TETRACYCLINE <=1 SENSITIVE Sensitive     VANCOMYCIN <=0.5 SENSITIVE Sensitive     TRIMETH/SULFA <=10 SENSITIVE Sensitive     CLINDAMYCIN <=0.25 SENSITIVE Sensitive     RIFAMPIN <=0.5 SENSITIVE Sensitive     Inducible Clindamycin NEGATIVE Sensitive     * METHICILLIN RESISTANT STAPHYLOCOCCUS AUREUS  Blood Culture ID Panel (Reflexed)     Status: Abnormal   Collection Time: 07/13/20 12:20 PM  Result Value Ref Range Status   Enterococcus faecalis NOT DETECTED NOT DETECTED Final   Enterococcus Faecium NOT DETECTED NOT DETECTED Final   Listeria monocytogenes NOT DETECTED NOT DETECTED Final   Staphylococcus species DETECTED (A) NOT DETECTED Final    Comment: CRITICAL RESULT CALLED TO, READ BACK BY AND VERIFIED WITH: PHARMD AMEND CAREN BY MESSAN H. AT 0120 ON 07/14/2020    Staphylococcus aureus (BCID) DETECTED (A) NOT DETECTED Final    Comment: Methicillin (oxacillin)-resistant Staphylococcus aureus (MRSA). MRSA is predictably resistant to beta-lactam antibiotics (except ceftaroline). Preferred therapy is vancomycin unless clinically contraindicated. Patient requires contact precautions if  hospitalized. CRITICAL RESULT CALLED TO, READ BACK BY AND VERIFIED WITH: PHARMD AMEND CAREN BY MESSAN H. AT 0120 ON 07/14/2020    Staphylococcus epidermidis NOT DETECTED NOT DETECTED Final   Staphylococcus lugdunensis NOT DETECTED NOT DETECTED Final   Streptococcus species NOT DETECTED NOT DETECTED Final   Streptococcus agalactiae NOT DETECTED NOT DETECTED Final   Streptococcus pneumoniae NOT DETECTED NOT DETECTED Final   Streptococcus pyogenes NOT DETECTED NOT DETECTED Final   A.calcoaceticus-baumannii NOT DETECTED NOT DETECTED Final   Bacteroides fragilis NOT DETECTED NOT DETECTED Final   Enterobacterales NOT DETECTED NOT DETECTED Final    Enterobacter cloacae complex NOT DETECTED NOT DETECTED Final   Escherichia coli NOT DETECTED NOT DETECTED Final   Klebsiella aerogenes NOT DETECTED NOT DETECTED Final   Klebsiella oxytoca NOT DETECTED NOT DETECTED Final   Klebsiella pneumoniae NOT DETECTED NOT DETECTED Final   Proteus species NOT DETECTED NOT DETECTED Final   Salmonella species NOT DETECTED NOT DETECTED Final   Serratia marcescens NOT DETECTED NOT DETECTED Final   Haemophilus influenzae NOT DETECTED NOT DETECTED Final   Neisseria meningitidis NOT DETECTED NOT DETECTED  Final   Pseudomonas aeruginosa NOT DETECTED NOT DETECTED Final   Stenotrophomonas maltophilia NOT DETECTED NOT DETECTED Final   Candida albicans NOT DETECTED NOT DETECTED Final   Candida auris NOT DETECTED NOT DETECTED Final   Candida glabrata NOT DETECTED NOT DETECTED Final   Candida krusei NOT DETECTED NOT DETECTED Final   Candida parapsilosis NOT DETECTED NOT DETECTED Final   Candida tropicalis NOT DETECTED NOT DETECTED Final   Cryptococcus neoformans/gattii NOT DETECTED NOT DETECTED Final   Meth resistant mecA/C and MREJ DETECTED (A) NOT DETECTED Final    Comment: CRITICAL RESULT CALLED TO, READ BACK BY AND VERIFIED WITH: PHARMD AMEND CAREN BY MESSAN H. AT 0120 ON 07/14/2020 Performed at [place] [address] [place], [place] [address]   Culture, blood (routine x 2)     Status: Abnormal   Collection Time: 07/13/20 12:21 PM   Specimen: BLOOD  Result Value Ref Range Status   Specimen Description BLOOD SITE NOT SPECIFIED  Final   Special Requests   Final    BOTTLES DRAWN AEROBIC AND ANAEROBIC Blood Culture adequate volume   Culture  Setup Time   Final    GRAM POSITIVE COCCI IN BOTH AEROBIC AND ANAEROBIC BOTTLES CRITICAL VALUE NOTED.  VALUE IS CONSISTENT WITH PREVIOUSLY REPORTED AND CALLED VALUE.    Culture (A)  Final    STAPHYLOCOCCUS AUREUS SUSCEPTIBILITIES PERFORMED ON PREVIOUS CULTURE WITHIN THE LAST 5 DAYS. Performed at [facility], [place] [address], [place], [place] [address]    Report Status 07/16/2020 FINAL  Final  MRSA PCR Screening     Status: Abnormal   Collection Time: 07/13/20  8:37 PM   Specimen: Nasal Mucosa; Nasopharyngeal  Result Value Ref Range Status   MRSA by PCR POSITIVE (A) NEGATIVE Final    Comment:        The GeneXpert MRSA Assay (FDA approved for NASAL specimens only), is one component of a comprehensive MRSA colonization surveillance program. It is not intended to diagnose MRSA infection nor to guide or monitor treatment for MRSA infections. RESULT CALLED TO, READ BACK BY AND VERIFIED WITH: TOLER,M RN 07/13/2020 AT 2319 SKEEN,P Performed at [facility], [place] [address], [place], [place] [address]   Urine culture     Status: None   Collection Time: 07/13/20 10:10 PM   Specimen: Urine, Clean Catch  Result Value Ref Range Status   Specimen Description URINE, CLEAN CATCH  Final   Special Requests NONE  Final   Culture   Final    NO GROWTH Performed at [facility], [address], [place], [place] [address]    Report Status 07/15/2020 FINAL  Final  Culture, blood (routine x 2)     Status: None (Preliminary result)   Collection Time: 07/15/20  6:49 PM   Specimen: BLOOD  Result Value Ref Range Status   Specimen Description BLOOD RIGHT ANTECUBITAL  Final   Special Requests   Final    BOTTLES DRAWN AEROBIC AND ANAEROBIC BACTEROIDES CACCAE   Culture   Final    NO GROWTH 4 DAYS Performed at [facility], [place] [address] [place], [place] [address]    Report Status PENDING  Incomplete  Culture, blood (routine x 2)     Status: None (Preliminary result)   Collection Time: 07/15/20  6:50 PM   Specimen: BLOOD  Result Value Ref Range Status   Specimen Description BLOOD BLOOD RIGHT FOREARM  Final   Special Requests  Final    BOTTLES DRAWN AEROBIC AND ANAEROBIC Blood Culture adequate volume   Culture   Final    NO GROWTH 4 DAYS Performed at [place], [place] [address] [place], [place] [address]    Report Status PENDING  Incomplete     Terri Piedra, [place] for [facility]  07/20/2020  10:45 AM

## 2020-07-20 NOTE — Progress Notes (Signed)
Called floor to get report on patient.  Patient is refusing MRI scan for third day in a row.  Will continue to check for patient's willingness to have MRI.

## 2020-07-20 NOTE — Progress Notes (Addendum)
NST completed.  Reactive and reassuring for 27 1/[redacted] weeks gestation. Dr Beverely Risen notified.  Reviewed strip with this RN.  Will start doing NST daily.

## 2020-07-20 NOTE — Plan of Care (Signed)

## 2020-07-20 NOTE — Progress Notes (Signed)
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 -Current active IV heroin user, uses about 3 g daily and IV fentanyl, presented to the ED with 3 to 4 weeks history of bilateral lower extremity edema associated with worsening dyspnea on exertion for 4 days, intermittent fever for several weeks and pain with deep inspiration across her right lateral chest and flank. -In the ED she was noted to be fluid overload in addition blood cultures were positive for MRSA and she was a started on IV vancomycin. -2D echo without obvious vegetation, TEE was deferred and plan is to treat empirically for endocarditis -Patient was also found to be 26 weeks Pregnancy.   Assessment & Plan:   Principal Problem:   MRSA bacteremia Active Problems:   Opioid use disorder, severe, dependence ([place])   No prenatal care in current pregnancy in second trimester   Community acquired pneumonia   History of bacterial endocarditis   Tricuspid regurgitation   Dental caries   Endocarditis of tricuspid valve   Acute CHF (congestive heart failure) (HCC)   Normal IUP (intrauterine pregnancy) on prenatal ultrasound, third trimester   Cellulitis of right lower extremity   Septic embolism (HCC)   Syphilis   [redacted] weeks gestation of pregnancy   Flank pain   Pain  1-MRSA bacteremia, presumed endocarditis: -Continue with IV vancomycin, ID following. -2D echo without obvious vegetation, TEE defer, plan to empirically treat as endocarditis. -Repeated blood cultures: So far negative. -Patient will probably need 6 weeks of IV antibiotics inpatient. -  2-Acute on chronic  Right side Heart Failure Severe TR Severe pulmonary hypertension -2D echo noted decreased RV systolic function, elevated PA pressure 92 severe TR. -Appreciate heart failure team management -Continue with IV Lasix --12 L  3-Chronic Anemia: -Anemia panel: B12 445, folate 7.4, iron 32, ferritin 111. -Hemoglobin baseline 8.5-9.5  4-IV heroin and fentanyl abuse Chronic pain Continue with methadone 30 mg 3 times daily IV Dilaudid as well  5-26 weeks pregnancy: -OB service following, getting daily fetal heart assessment.  6-Pneumonia: Possible septic pulmonary infarcts. 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 -Given penicillin 2.4 MU intramuscular  8-back pain: Unable to tolerate MRI, discontinue with ID 10-hyponatremia: monitor on Lasix   Estimated body mass index is 28.71 kg/m as calculated from the following:   Height as of this encounter: 5' 7.5" (1.715 m).   Weight as of this encounter: 84.4 kg.   DVT prophylaxis: lovenox Code Status: Full cdoe Family Communication: care discussed with patient/ Disposition Plan:  Status is: Inpatient  Remains inpatient appropriate because:IV treatments appropriate due to intensity of illness or inability to take PO   Dispo: The patient is from: Home              Anticipated d/c is to: Home              Patient currently is not medically stable to d/c.   Difficult to place patient No        Consultants:   ID  GYN  Cardiology   Procedures:   None  Antimicrobials:  IV Vancomycin   Subjective: She complaints  of side pain, ribs pain.  Complaints of SOB, worse at night.   Objective: Vitals:   07/19/20 1724 07/19/20 2100 07/20/20 0734 07/20/20 1134  BP: (!) 121/91 123/84 127/88   Pulse: 100 88 72   Resp: 19 18 18 17   Temp: 97.7 F (36.5 C) 97.7 F (36.5 C) 98.1 F (36.7 C) 98.3 F (36.8 C)  TempSrc: Oral Oral Oral Oral  SpO2:  99% 97%    Weight:      Height:        Intake/Output Summary (Last 24 hours) at 07/20/2020 1323 Last data filed at 07/20/2020 1048 Gross per 24 hour  Intake 1130.4 ml  Output 2450 ml  Net -1319.6 ml   Filed Weights   07/16/20 0426 07/17/20 0431 07/19/20 0519  Weight: 92.8 kg 91.8 kg 84.4 kg    Examination:  General exam: Appears calm and comfortable  Respiratory system: BL crackles.  Cardiovascular system: S1 & S2 heard, RRR. No JVD, murmurs, rubs, gallops or clicks. No pedal edema. Gastrointestinal system: Abdomen soft and nontender. No organomegaly or masses felt. Normal bowel sounds heard. GU:  Gravid uterus  Central nervous system: Alert and oriented. Extremities: Symmetric 5 x 5 power.    Data Reviewed: I have personally reviewed following labs and imaging studies  CBC: Recent Labs  Lab 07/14/20 0157 07/16/20 0333 07/17/20 0622 07/18/20 0500 07/20/20 0500  WBC 20.4* 13.0* 11.8* 12.5* 17.3*  HGB 8.4* 8.3* 7.5* 8.4* 9.4*  HCT 24.4* 26.2* 23.6* 26.5* 29.2*  MCV 90.4 94.6 94.4 94.3 91.5  PLT 238 301 299 317 882*   Basic Metabolic Panel: Recent Labs  Lab 07/14/20 0157 07/14/20 1947 07/15/20 0416 07/16/20 0333 07/17/20 0622 07/18/20 0500 07/19/20 1112 07/20/20 0500  NA 129*  --  132* 132* 133* 131* 130* 129*  K 3.4*  --  3.9 4.1 4.0 3.8 4.7 4.5  CL 96*  --  96* 96* 97* 93* 93* 90*  CO2 19*  --  26 29 27 27 28 27   GLUCOSE 103*  --  88 82 81 95 80 85  BUN 7  --  9 7 7 8 8 11   CREATININE 0.54  --  0.50 0.51 0.47 0.52 0.51 0.46  CALCIUM 8.2*  --  8.4* 8.4* 8.2* 8.6* 9.2 9.2  MG 1.2* 1.8 1.6*  --   --   --   --  1.8  PHOS 3.2  --  2.4*  --   --   --   --   --    GFR: Estimated Creatinine Clearance: 110.6 mL/min (by C-G formula based on SCr of 0.46 mg/dL). Liver Function Tests: Recent Labs  Lab 07/13/20 2110 07/16/20 0333 07/17/20 0622  AST 14* 11* 11*  ALT 6 6 <5  ALKPHOS 128* 130* 163*  BILITOT 1.3* 0.6 0.7  PROT 6.3* 6.0* 6.1*  ALBUMIN 2.0* 1.7* 1.7*    No results for input(s): LIPASE, AMYLASE in the last 168 hours. No results for input(s): AMMONIA in the last 168 hours. Coagulation Profile: No results for input(s): INR, PROTIME in the last 168 hours. Cardiac Enzymes: No results for input(s): CKTOTAL, CKMB, CKMBINDEX, TROPONINI in the last 168 hours. BNP (last 3 results) No results for input(s): PROBNP in the last 8760 hours. HbA1C: No results for input(s): HGBA1C in the last 72 hours. CBG: Recent Labs  Lab 07/13/20 2039  GLUCAP 108*   Lipid Profile: No results for input(s): CHOL, HDL, LDLCALC, TRIG, CHOLHDL, LDLDIRECT in the last 72 hours. Thyroid  Function Tests: No results for input(s): TSH, T4TOTAL, FREET4, T3FREE, THYROIDAB in the last 72 hours. Anemia Panel: Recent Labs    07/17/20 1520 07/17/20 1530  VITAMINB12  --  445  FOLATE  --  7.4  FERRITIN  --  112  TIBC  --  365  IRON  --  32  RETICCTPCT 2.2  --    Sepsis Labs: Recent Labs  Lab 07/13/20 1530 07/13/20 2110 07/14/20 0021  LATICACIDVEN 2.0* 1.4 1.5    Recent Results (from the past 240 hour(s))  Resp Panel by RT-PCR (Flu A&B, Covid) Nasopharyngeal Swab     Status: None   Collection Time: 07/13/20 11:21 AM   Specimen: Nasopharyngeal Swab; Nasopharyngeal(NP) swabs in vial transport medium  Result Value Ref Range Status   SARS Coronavirus 2 by RT PCR NEGATIVE NEGATIVE Final    Comment: (NOTE) SARS-CoV-2 target nucleic acids are NOT DETECTED.  The SARS-CoV-2 RNA is generally detectable in upper respiratory specimens during the acute phase of infection. The lowest concentration of SARS-CoV-2 viral copies this assay can detect is 138 copies/mL. A negative result does not preclude SARS-Cov-2 infection and should not be used as the sole basis for treatment or other patient management decisions. A negative result may occur with  improper specimen collection/handling, submission of specimen other than nasopharyngeal swab, presence of viral mutation(s)  within the areas targeted by this assay, and inadequate number of viral copies(<138 copies/mL). A negative result must be combined with clinical observations, patient history, and epidemiological information. The expected result is Negative.  Fact Sheet for Patients:  EntrepreneurPulse.com.au  Fact Sheet for Healthcare Providers:  IncredibleEmployment.be  This test is no t yet approved or cleared by the [place] FDA and  has been authorized for detection and/or diagnosis of SARS-CoV-2 by FDA under an Emergency Use Authorization (EUA). This EUA will remain  in effect (meaning this test can be used) for the duration of the COVID-19 declaration under Section 564(b)(1) of the Act, 21 U.S.C.section 360bbb-3(b)(1), unless the authorization is terminated  or revoked sooner.       Influenza A by PCR NEGATIVE NEGATIVE Final   Influenza B by PCR NEGATIVE NEGATIVE Final    Comment: (NOTE) The Xpert Xpress SARS-CoV-2/FLU/RSV plus assay is intended as an aid in the diagnosis of influenza from Nasopharyngeal swab specimens and should not be used as a sole basis for treatment. Nasal washings and aspirates are unacceptable for Xpert Xpress SARS-CoV-2/FLU/RSV testing.  Fact Sheet for Patients: EntrepreneurPulse.com.au  Fact Sheet for Healthcare Providers: IncredibleEmployment.be  This test is not yet approved or cleared by the [place] FDA and has been authorized for detection and/or diagnosis of SARS-CoV-2 by FDA under an Emergency Use Authorization (EUA). This EUA will remain in effect (meaning this test can be used) for the duration of the COVID-19 declaration under Section 564(b)(1) of the Act, 21 U.S.C. section 360bbb-3(b)(1), unless the authorization is terminated or revoked.  Performed at [facility], [place] [address] [place], [place] [address]   Culture, blood (routine x 2)     Status:  Abnormal   Collection Time: 07/13/20 12:20 PM   Specimen: BLOOD  Result Value Ref Range Status   Specimen Description BLOOD SITE NOT SPECIFIED  Final   Special Requests   Final    BOTTLES DRAWN AEROBIC AND ANAEROBIC Blood Culture adequate volume   Culture  Setup Time   Final    GRAM POSITIVE COCCI IN CHAINS IN BOTH AEROBIC AND ANAEROBIC BOTTLES Organism  ID to follow CRITICAL RESULT CALLED TO, READ BACK BY AND VERIFIED WITH: Performed at [facility], [place] [address], [place], [place] [address]    Culture METHICILLIN RESISTANT STAPHYLOCOCCUS AUREUS (A)  Final   Report Status 07/16/2020 FINAL  Final   Organism ID, Bacteria METHICILLIN RESISTANT STAPHYLOCOCCUS AUREUS  Final      Susceptibility   Methicillin resistant staphylococcus aureus - MIC*    CIPROFLOXACIN <=0.5 SENSITIVE Sensitive     ERYTHROMYCIN >=8 RESISTANT Resistant     GENTAMICIN <=0.5 SENSITIVE Sensitive     OXACILLIN >=4 RESISTANT Resistant     TETRACYCLINE <=1 SENSITIVE Sensitive     VANCOMYCIN <=0.5 SENSITIVE Sensitive     TRIMETH/SULFA <=10 SENSITIVE Sensitive     CLINDAMYCIN <=0.25 SENSITIVE Sensitive     RIFAMPIN <=0.5 SENSITIVE Sensitive     Inducible Clindamycin NEGATIVE Sensitive     * METHICILLIN RESISTANT STAPHYLOCOCCUS AUREUS  Blood Culture ID Panel (Reflexed)     Status: Abnormal   Collection Time: 07/13/20 12:20 PM  Result Value Ref Range Status   Enterococcus faecalis NOT DETECTED NOT DETECTED Final   Enterococcus Faecium NOT DETECTED NOT DETECTED Final   Listeria monocytogenes NOT DETECTED NOT DETECTED Final   Staphylococcus species DETECTED (A) NOT DETECTED Final    Comment: CRITICAL RESULT CALLED TO, READ BACK BY AND VERIFIED WITH: PHARMD AMEND CAREN BY MESSAN H. AT 0120 ON 07/14/2020    Staphylococcus aureus (BCID) DETECTED (A) NOT DETECTED Final    Comment: Methicillin (oxacillin)-resistant Staphylococcus aureus (MRSA). MRSA is predictably resistant to beta-lactam antibiotics (except  ceftaroline). Preferred therapy is vancomycin unless clinically contraindicated. Patient requires contact precautions if  hospitalized. CRITICAL RESULT CALLED TO, READ BACK BY AND VERIFIED WITH: PHARMD AMEND CAREN BY MESSAN H. AT 0120 ON 07/14/2020    Staphylococcus epidermidis NOT DETECTED NOT DETECTED Final   Staphylococcus lugdunensis NOT DETECTED NOT DETECTED Final   Streptococcus species NOT DETECTED NOT DETECTED Final   Streptococcus agalactiae NOT DETECTED NOT DETECTED Final   Streptococcus pneumoniae NOT DETECTED NOT DETECTED Final   Streptococcus pyogenes NOT DETECTED NOT DETECTED Final   A.calcoaceticus-baumannii NOT DETECTED NOT DETECTED Final   Bacteroides fragilis NOT DETECTED NOT DETECTED Final   Enterobacterales NOT DETECTED NOT DETECTED Final   Enterobacter cloacae complex NOT DETECTED NOT DETECTED Final   Escherichia coli NOT DETECTED NOT DETECTED Final   Klebsiella aerogenes NOT DETECTED NOT DETECTED Final   Klebsiella oxytoca NOT DETECTED NOT DETECTED Final   Klebsiella pneumoniae NOT DETECTED NOT DETECTED Final   Proteus species NOT DETECTED NOT DETECTED Final   Salmonella species NOT DETECTED NOT DETECTED Final   Serratia marcescens NOT DETECTED NOT DETECTED Final   Haemophilus influenzae NOT DETECTED NOT DETECTED Final   Neisseria meningitidis NOT DETECTED NOT DETECTED Final   Pseudomonas aeruginosa NOT DETECTED NOT DETECTED Final   Stenotrophomonas maltophilia NOT DETECTED NOT DETECTED Final   Candida albicans NOT DETECTED NOT DETECTED Final   Candida auris NOT DETECTED NOT DETECTED Final   Candida glabrata NOT DETECTED NOT DETECTED Final   Candida krusei NOT DETECTED NOT DETECTED Final   Candida parapsilosis NOT DETECTED NOT DETECTED Final   Candida tropicalis NOT DETECTED NOT DETECTED Final   Cryptococcus neoformans/gattii NOT DETECTED NOT DETECTED Final   Meth resistant mecA/C and MREJ DETECTED (A) NOT DETECTED Final    Comment: CRITICAL RESULT CALLED TO,  READ BACK BY AND VERIFIED WITH: PHARMD AMEND CAREN BY MESSAN H. AT 0120 ON 07/14/2020 Performed at [facility], [place] [address], [place], [place] [address]   Culture, blood (routine x 2)     Status: Abnormal   Collection Time: 07/13/20 12:21 PM   Specimen: BLOOD  Result Value Ref Range Status   Specimen Description BLOOD SITE NOT SPECIFIED  Final   Special Requests   Final    BOTTLES DRAWN AEROBIC AND ANAEROBIC Blood Culture adequate volume   Culture  Setup Time   Final    GRAM POSITIVE COCCI IN BOTH AEROBIC AND ANAEROBIC BOTTLES CRITICAL VALUE NOTED.  VALUE IS CONSISTENT WITH PREVIOUSLY REPORTED AND CALLED VALUE.    Culture (A)  Final    STAPHYLOCOCCUS AUREUS SUSCEPTIBILITIES PERFORMED ON PREVIOUS CULTURE WITHIN THE LAST 5 DAYS. Performed at [facility], [place] [address] [place], [place] [address]    Report Status 07/16/2020 FINAL  Final  MRSA PCR Screening     Status: Abnormal   Collection Time: 07/13/20  8:37 PM   Specimen: Nasal Mucosa; Nasopharyngeal  Result Value Ref Range Status   MRSA by PCR POSITIVE (A) NEGATIVE Final    Comment:        The GeneXpert MRSA Assay (FDA approved for NASAL specimens only), is one component of a comprehensive MRSA colonization surveillance program. It is not intended to diagnose MRSA infection nor to guide or monitor treatment for MRSA infections. RESULT CALLED TO, READ BACK BY AND VERIFIED WITH: TOLER,M RN 07/13/2020 AT 2319 SKEEN,P Performed at [facility], [place] [address], [place], [place] [address]   Urine culture     Status: None   Collection Time: 07/13/20 10:10 PM   Specimen: Urine, Clean Catch  Result Value Ref Range Status   Specimen Description URINE, CLEAN CATCH  Final   Special Requests NONE  Final   Culture   Final    NO GROWTH Performed at [facility], [address], [place], [place] [address]    Report Status 07/15/2020 FINAL  Final  Culture, blood (routine x 2)     Status: None    Collection Time: 07/15/20  6:49 PM   Specimen: BLOOD  Result Value Ref Range Status   Specimen Description BLOOD RIGHT ANTECUBITAL  Final   Special Requests   Final    BOTTLES DRAWN AEROBIC AND ANAEROBIC BACTEROIDES CACCAE   Culture   Final    NO GROWTH 5 DAYS Performed at [facility], [place] [address] [place], [place] [address]    Report Status 07/20/2020 FINAL  Final  Culture, blood (routine x 2)     Status: None   Collection Time: 07/15/20  6:50 PM   Specimen: BLOOD  Result Value Ref Range Status   Specimen Description BLOOD BLOOD RIGHT FOREARM  Final   Special Requests   Final    BOTTLES DRAWN AEROBIC AND ANAEROBIC Blood Culture adequate volume   Culture   Final    NO GROWTH 5 DAYS Performed at [facility], [place] [address], [place], [place] [address]    Report Status 07/20/2020 FINAL  Final         Radiology Studies: No results found.      Scheduled Meds:  sodium chloride   Intravenous Once   acetaminophen  650 mg Oral Q6H   Chlorhexidine Gluconate Cloth  6 each Topical Daily   enoxaparin (LOVENOX) injection  60 mg Subcutaneous Q24H   furosemide  40 mg Intravenous BID   methadone  30 mg Oral TID PC   polyethylene  glycol  17 g Oral BID   prenatal vitamin w/FE, FA  1 tablet Oral Q1200   senna-docusate  1 tablet Oral BID   Continuous Infusions:  vancomycin 750 mg (07/20/20 0925)     LOS: 7 days    Time spent: 35 minutes    Nyjah Schwake A Gerren Hoffmeier, MD [facility]   If 7PM-7AM, please contact night-coverage [email or web address]  07/20/2020, 1:23 PM

## 2020-07-20 NOTE — Progress Notes (Addendum)
Advanced Heart Failure Rounding Note   Subjective:    Diuretics adjusted yesterday, Lasix increased to 40 mg bid. -2.2L in UOP. Wt not charted. Still complaining of orthopnea.   Has been complaining of rib pain and LBP. ? related to septic embolization to her pregnancy vs lumbar spine disease. MRI ordered but pt refusing to go.   WBC trending back up 12.5>>17.3 but AF. Remains on abx.   Na 129   Objective:   Weight Range:  Vital Signs:   Temp:  [97.3 F (36.3 C)-98.1 F (36.7 C)] 98.1 F (36.7 C) (03/09 0734) Pulse Rate:  [72-100] 72 (03/09 0734) Resp:  [18-19] 18 (03/09 0734) BP: (121-127)/(84-91) 127/88 (03/09 0734) SpO2:  [97 %-99 %] 97 % (03/09 0734) Last BM Date: 07/14/20  Weight change: Filed Weights   07/16/20 0426 07/17/20 0431 07/19/20 0519  Weight: 92.8 kg 91.8 kg 84.4 kg    Intake/Output:   Intake/Output Summary (Last 24 hours) at 07/20/2020 1033 Last data filed at 07/20/2020 0901 Gross per 24 hour  Intake 1130.4 ml  Output 2150 ml  Net -1019.6 ml     PHYSICAL EXAM: General:  Fatigued looking. Looks much older than actual age. No respiratory difficulty HEENT: dentition in poor repair, otherwise normal Neck: supple. JVD not well visualized. Carotids 2+ bilat; no bruits. No lymphadenopathy or thyromegaly appreciated. Cor: PMI nondisplaced. Regular rate & rhythm. No rubs, gallops + 2/6 TR murmur Lungs: decreased BS at the bases bilaterally  Abdomen: pregnant. No hepatosplenomegaly. No bruits or masses. Good bowel sounds. Extremities: no cyanosis, clubbing, rash, trace bilateral LEE Neuro: alert & oriented x 3, cranial nerves grossly intact. moves all 4 extremities w/o difficulty. Affect pleasant.   Telemetry: NSR 90s    Labs: Basic Metabolic Panel: Recent Labs  Lab 07/14/20 0157 07/14/20 1947 07/15/20 0416 07/16/20 0333 07/17/20 0622 07/18/20 0500 07/19/20 1112 07/20/20 0500  NA 129*  --  132* 132* 133* 131* 130* 129*  K 3.4*  --  3.9  4.1 4.0 3.8 4.7 4.5  CL 96*  --  96* 96* 97* 93* 93* 90*  CO2 19*  --  26 29 27 27 28 27   GLUCOSE 103*  --  88 82 81 95 80 85  BUN 7  --  9 7 7 8 8 11   CREATININE 0.54  --  0.50 0.51 0.47 0.52 0.51 0.46  CALCIUM 8.2*  --  8.4* 8.4* 8.2* 8.6* 9.2 9.2  MG 1.2* 1.8 1.6*  --   --   --   --  1.8  PHOS 3.2  --  2.4*  --   --   --   --   --     Liver Function Tests: Recent Labs  Lab 07/13/20 1035 07/13/20 2110 07/16/20 0333 07/17/20 0622  AST 18 14* 11* 11*  ALT 7 6 6  <5  ALKPHOS 138* 128* 130* 163*  BILITOT 1.1 1.3* 0.6 0.7  PROT 7.1 6.3* 6.0* 6.1*  ALBUMIN 2.3* 2.0* 1.7* 1.7*   Recent Labs  Lab 07/13/20 1035  LIPASE 19   No results for input(s): AMMONIA in the last 168 hours.  CBC: Recent Labs  Lab 07/14/20 0157 07/16/20 0333 07/17/20 0622 07/18/20 0500 07/20/20 0500  WBC 20.4* 13.0* 11.8* 12.5* 17.3*  HGB 8.4* 8.3* 7.5* 8.4* 9.4*  HCT 24.4* 26.2* 23.6* 26.5* 29.2*  MCV 90.4 94.6 94.4 94.3 91.5  PLT 238 301 299 317 426*    Cardiac Enzymes: Recent Labs  Lab 07/13/20  1219  CKTOTAL 68    BNP: BNP (last 3 results) Recent Labs    07/13/20 1219  BNP 157.1*    ProBNP (last 3 results) No results for input(s): PROBNP in the last 8760 hours.    Other results:  Imaging: No results found.   Medications:     Scheduled Medications: . sodium chloride   Intravenous Once  . acetaminophen  650 mg Oral Q6H  . Chlorhexidine Gluconate Cloth  6 each Topical Daily  . enoxaparin (LOVENOX) injection  60 mg Subcutaneous Q24H  . furosemide  40 mg Intravenous BID  . methadone  30 mg Oral TID PC  . polyethylene glycol  17 g Oral BID  . prenatal vitamin w/FE, FA  1 tablet Oral Q1200  . senna-docusate  1 tablet Oral BID    Infusions: . magnesium sulfate bolus IVPB    . vancomycin 750 mg (07/20/20 0925)    PRN Medications: HYDROmorphone (DILAUDID) injection, prochlorperazine, sodium chloride flush   Assessment/Plan:   1. MRSA Sepsis  - bcx 2/2 + for  MRSA - clinically improved with IV abx and volume resuscitation.  - ID has seen and adjusting abx. Will need 6 weeks.  - Bld Cx 3/4 - No growth.  - no obvious vegetation on valve on TTE but assume she has recurrent endocarditis and would treat for 6 weeks with IV abx. D/w ID and both agree that TEE would not change management at this point as valve known to be heavily damaged from previous episodes and currently not candidate for operative repair.  - Given ongoing IVDA will need to understand strategy of how she will get 6 weeks of IV abx safely. Is plan for SNF or LTAC?   2. Acute on Chronic Systolic CHF with Mostly Right Sided CHF (Likely Secondary to Severe TR) - Echo 07/13/20 showed normalization of LV function  LVEF of 55% with normal wall motion. RV severely enlarged with moderately reduced systolic function and severely elevated PASP of 92.14mHg. Severe TR noted but no vegetation visualized.  - Volume overloaded on exam w/ increased dyspnea/ orthopnea - Continue IV Lasix 40 mg bid + supp KCl    3. 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 He was seen by Dr. GServando Snarein 05/2019 for possible surgical repair. However, patient was lost to follow-up and unfortunately started using drugs again. - Echo this admission shows severe TR but no signs of vegetation.  4. 26+w pregnant - u/s ok - OB following - Case d/w high-risk OB team who is following peripherally.   5. RPR + - ID sent confirmatory test - Given penicillin.   6. Polysubstance Abuse -Had using IV heroine + fentanyl - On methadone.   7.Anemia  -Hgb 9.4 -No bleeding noted.   8. Hyponatremia - Na 129 - likely due to hypervolemia - continue diuresis    Length of Stay: 7   Brittainy Simmons PA-C  07/20/2020, 10:33 AM  Advanced Heart Failure Team Pager [phone](M-F; [place]  Please contact [facility] for night-coverage after hours (4p -7a ) and weekends on [email or web address]   Patient seen and examined with the above-signed Advanced Practice Provider and/or Housestaff. I personally reviewed laboratory data, imaging studies and relevant notes. I independently examined the patient and formulated the important aspects of the plan. I have edited the note to reflect any of my changes or salient points.  I have personally discussed the plan with the patient and/or family.  Continues to complain of back pain. WBC rising. Afebrile. + SOB/orthopnea   General: Lying in bed. C/o SOB HEENT: normal Neck: supple. no JVP 9-10. Carotids 2+ bilat; no bruits. No lymphadenopathy or thryomegaly appreciated. Cor: PMI nondisplaced. Regular rate & rhythm. 3/6 TR Lungs: clear Abdomen: soft, nontender, gravid. No hepatosplenomegaly. No bruits or masses. Good bowel sounds. Extremities: no cyanosis, clubbing, rash, 2+ dema Neuro: alert & orientedx3, cranial nerves grossly intact. moves all 4 extremities w/o difficulty. Affect pleasant   She remains volume overloaded. Continue IV diuresis at least one more day. D/w ID and anesthesia. Plan is to keep her in house for full course of abx. Will need to discuss timing of possible delivery with OB.   Glori Bickers, MD  2:09 PM

## 2020-07-20 NOTE — Progress Notes (Signed)
FACULTY PRACTICE Antenatal Progress Note  Kelly [name] is a 35 y.o. [phone] currently 271wd admitted for MRSA bacteremia, infective endocarditis, severe tricuspid regurgitation, septic emboli with h/o discitis/osteomyelitis/epidural abscess, syphilis, chronic HTN, and acute on chronic right side heart failure in the setting of longstanding h/o IV heroin and fentanyl abuse.  Fetal presentation: Breech as per 07/20/2020 ultrasound  Length of Stay:  7  Days  Date of admission:07/13/2020  Subjective:   More able to hold conversation today, more awake. Reports continued back and rib pain, making it hard to sleep. She was ordered for MRI but she refused to go because "I cannot lay there for an hour".  Also complains of tooth pain. Patient reports the fetal movement as active. Patient reports uterine contraction  activity as none. Patient reports  vaginal bleeding as none. Patient describes fluid per vagina as none.  Vitals:  Blood pressure 127/88, pulse 72, temperature 98.3 F (36.8 C), temperature source Oral, resp. rate 17, height 5' 7.5" (1.715 m), weight 84.4 kg, SpO2 97 %, not currently breastfeeding. Vitals:   07/19/20 1724 07/19/20 2100 07/20/20 0734 07/20/20 1134  BP: (!) 121/91 123/84 127/88   Pulse: 100 88 72   Resp: 19 18 18 17   Temp: 97.7 F (36.5 C) 97.7 F (36.5 C) 98.1 F (36.7 C) 98.3 F (36.8 C)  TempSrc: Oral Oral Oral Oral  SpO2:  99% 97%   Weight:      Height:       Physical Examination: General appearance - lying in bed Mental status - cooperative, talking Abdomen - gravid, soft and non-tender Extremities - edema much improved FHR Tracing (reviewed by me): Baseline 155 bpm, moderate variability, +accelerations, one quick variable deceleration to 120 for about 15 secs.  Category 1. Performed by OB Rapid Response RN   Labs: CBC Latest Ref Rng & Units 07/20/2020 07/18/2020 07/17/2020  WBC 4.0 - 10.5 K/uL 17.3(H) 12.5(H) 11.8(H)  Hemoglobin 12.0 - 15.0 g/dL  9.4(L) 8.4(L) 7.5(L)  Hematocrit 36.0 - 46.0 % 29.2(L) 26.5(L) 23.6(L)  Platelets 150 - 400 K/uL 426(H) 317 299   CMP Latest Ref Rng & Units 07/20/2020 07/19/2020 07/18/2020  Glucose 70 - 99 mg/dL 85 80 95  BUN 6 - 20 mg/dL 11 8 8   Creatinine 0.44 - 1.00 mg/dL 0.46 0.51 0.52  Sodium 135 - 145 mmol/L 129(L) 130(L) 131(L)  Potassium 3.5 - 5.1 mmol/L 4.5 4.7 3.8  Chloride 98 - 111 mmol/L 90(L) 93(L) 93(L)  CO2 22 - 32 mmol/L 27 28 27   Calcium 8.9 - 10.3 mg/dL 9.2 9.2 8.6(L)  Total Protein 6.5 - 8.1 g/dL - - -  Total Bilirubin 0.3 - 1.2 mg/dL - - -  Alkaline Phos 38 - 126 U/L - - -  AST 15 - 41 U/L - - -  ALT 0 - 44 U/L - - -    07/20/2020 MFM Ultrasound: Breech presentation.  No abnormalities on discussion with Dr. Annamaria Boots, report pending.   A/P: 56LO [id number] @ 66w1dadmitted for MRSA bacteremia, endocarditis and cardiac failure with history of polysubstance abuse. Also multiple other heath issues as seen below.   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)   Tricuspid regurgitation  Preexisting hypertension complicating pregnancy, antepartum   Dental caries   Endocarditis of tricuspid valve   History of VBAC   Lumbar back pain   Acute on chronic congestive heart failure (HCC)   Cellulitis of right lower extremity   Septic embolism (HCC)   Syphilis   [redacted] weeks gestation of pregnancy   Flank pain   Drug use affecting pregnancy in second trimester   Maternal endocarditis affecting pregnancy in second trimester, antepartum  -Management per IM, ID and Cardiology teams as current hospitalization is not related to pregnancy appreciate the multidisciplinary care for this complex patient. Will defer decisions about  appropriate analgesia to her team, NSAIDS are contraindicated in pregnancy.  -Consider consult for evaluation of mouth/teeth if pain continues/worsens.  -From an OB standpoint, fetal well being reassuring. Reassuring FHR strip and ultrasound done today.  There are no signs of any acute OB concerns. -If delivery is indicated during her hospital stay, we will manage accordingly and decide on modality depending on the indication for delivery.  -We will continue to follow patient closely. Daily FHR assessment will be done by our Rapid Response OB Nurse as directed, and weekly ultrasounds by our MFM team.  Please call [phone] [facility] Consult Attending Monday-Friday 8am - 5pm) or [phone] [facility] OB/GYN Attending On Call all day, every day) for any obstetric concerns at any time.   Total consultation time including face-to-face time with patient (>50% of time), reviewing chart and documentation: 25 minutes   Verita Schneiders, MD, [place], [name] for [employer], [place] Phone: [phone]

## 2020-07-20 NOTE — Consult Note (Signed)
MFM Note  This patient has been hospitalized due to MRSA bacteremia and endocarditis of the tricuspid valve.  She is currently treated with vancomycin and the plan is for her to complete a 6-week course of vancomycin as an inpatient.  She has a history of IV drug abuse and is currently treated with methadone 30 mg 3 times a day.  She also has possible secondary syphilis that has been treated with penicillin.  She is being followed by the heart failure team.  A limited ultrasound performed today shows that the fetus is in the breech presentation.    There was normal amniotic fluid noted.    During the performance of her ultrasound today, a possible dip in the fetal heart rate down to the low 100s was noted on the M-mode Dopplers.  The actual heart rate was difficult to decipher based on the M mode Doppler exam.  The patient cooperated with fetal heart rate monitoring this afternoon.  The baseline fetal heart rate was noted to be in the 140s, with moderate variability and small accelerations.  The heart rate tracing was reassuring for her gestational age.  Due to the reported risk of fetal mortality (10% to 15%) in pregnant women with infectious endocarditis, the plan will be to continue daily fetal heart rate monitoring for 20 minutes daily while she is hospitalized (if patient is cooperative).  We will start weekly biophysical profiles and amniotic fluid checks at 28 weeks.

## 2020-07-20 NOTE — Progress Notes (Signed)
Talked with Dr Beverely Risen today.  Will do NST today per MFM.  Monitors applied and adjusted. Patient reports good fetal movement and it is audible on EFM.  No c/o leaking or bleeding noted.  No abdominal cramping. NST initiated.

## 2020-07-21 DIAGNOSIS — O99322 Drug use complicating pregnancy, second trimester: Secondary | ICD-10-CM | POA: Diagnosis not present

## 2020-07-21 DIAGNOSIS — B9562 Methicillin resistant Staphylococcus aureus infection as the cause of diseases classified elsewhere: Secondary | ICD-10-CM | POA: Diagnosis not present

## 2020-07-21 DIAGNOSIS — R7881 Bacteremia: Secondary | ICD-10-CM | POA: Diagnosis not present

## 2020-07-21 DIAGNOSIS — Z3A26 26 weeks gestation of pregnancy: Secondary | ICD-10-CM | POA: Diagnosis not present

## 2020-07-21 DIAGNOSIS — I079 Rheumatic tricuspid valve disease, unspecified: Secondary | ICD-10-CM | POA: Diagnosis not present

## 2020-07-21 DIAGNOSIS — F1120 Opioid dependence, uncomplicated: Secondary | ICD-10-CM | POA: Diagnosis present

## 2020-07-21 LAB — BASIC METABOLIC PANEL
Anion gap: 10 (ref 5–15)
BUN: 18 mg/dL (ref 6–20)
CO2: 28 mmol/L (ref 22–32)
Calcium: 9.8 mg/dL (ref 8.9–10.3)
Chloride: 92 mmol/L — ABNORMAL LOW (ref 98–111)
Creatinine, Ser: 0.53 mg/dL (ref 0.44–1.00)
GFR, Estimated: >60 mL/min (ref >60)
Glucose, Bld: 90 mg/dL (ref 70–99)
Potassium: 4.7 mmol/L (ref 3.5–5.1)
Sodium: 130 mmol/L — ABNORMAL LOW (ref 135–145)

## 2020-07-21 LAB — CBC
HCT: 33.8 % — ABNORMAL LOW (ref 36.0–46.0)
Hemoglobin: 10.9 g/dL — ABNORMAL LOW (ref 12.0–15.0)
MCH: 29.4 pg (ref 26.0–34.0)
MCHC: 32.2 g/dL (ref 30.0–36.0)
MCV: 91.1 fL (ref 80.0–100.0)
Platelets: 483 10*3/uL — ABNORMAL HIGH (ref 150–400)
RBC: 3.71 MIL/uL — ABNORMAL LOW (ref 3.87–5.11)
RDW: 14.2 % (ref 11.5–15.5)
WBC: 17.5 10*3/uL — ABNORMAL HIGH (ref 4.0–10.5)
nRBC: 0 % (ref 0.0–0.2)

## 2020-07-21 LAB — GLUCOSE, CAPILLARY: Glucose-Capillary: 121 mg/dL — ABNORMAL HIGH (ref 70–99)

## 2020-07-21 MED ORDER — FUROSEMIDE 40 MG PO TABS
40.0000 mg | ORAL_TABLET | Freq: Two times a day (BID) | ORAL | Status: DC
  Administered 2020-07-21 – 2020-07-22 (×2): 40 mg via ORAL
  Filled 2020-07-21 (×2): qty 1

## 2020-07-21 NOTE — Progress Notes (Signed)
Pharmacy Antibiotic Note  Kelly [name] is a 35 y.o. female with MRSA bacteremia  And TV endocarditis. 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  Pharmacy has been consulted for vancomycin. Patient is pregnant with EGA of [redacted] weeks.  3/7: Vancomycin peak AM = 46, trough = 14.  Caculated AUC = 747 (above goal).     Plan: Continue vancomycin to 750 mg IV q 8 hrs Recheck vancomycin trough tomorrow. Monitor C&S, clinical status, renal function, VT as needed  Temp (24hrs), Avg:98.2 F (36.8 C), Min:97.9 F (36.6 C), Max:98.5 F (36.9 C)  Recent Labs  Lab 07/15/20 1849 07/16/20 0333 07/17/20 0622 07/17/20 1619 07/18/20 0030 07/18/20 0500 07/18/20 0530 07/19/20 1112 07/20/20 0500 07/21/20 1004  WBC  --  13.0* 11.8*  --   --  12.5*  --   --  17.3* 17.5*  CREATININE  --  0.51 0.47  --   --  0.52  --  0.51 0.46 0.53  VANCOTROUGH 13*  --   --   --   --   --  14*  --   --   --   VANCOPEAK  --   --   --  46* 46*  --   --   --   --   --     Estimated Creatinine Clearance: 107.4 mL/min (by C-G formula based on SCr of 0.53 mg/dL).    Allergies  Allergen Reactions  . Hydrocodone Itching  . Morphine And Related Nausea And Vomiting    Antimicrobials this admission: Azith 3/2 x1 CTX 3/2 x1 Vancomycin 3/2> Cefepime 3/2> 3/3  Cultures: 3/2 urine - ng 3/2 Bld - GPC - MRSA 3/2 MRSA PCR pos 3/4 BCx x 2 > Neg  Thank you for allowing pharmacy to be a part of this patient's care.  [place] Crane, Roylene Reason, BCCP Clinical Pharmacist  07/21/2020 12:23 PM   [facility] pharmacy phone numbers are listed on [email or web address]

## 2020-07-21 NOTE — Progress Notes (Signed)
[facility] for Infectious Disease  Date of Admission:  07/13/2020     Total days of antibiotics 9         ASSESSMENT:  Ms. Regenia Skeeter blood cultures have remained without growth to date. Source of back/flank pain remains unclear with differentials including septic emboli, infection, or pregnancy related. Will continue to consider imaging if back pain does not improve or neurological symptoms develop and may require sedation for MRI. Will continue current dose of vancomycin. Obstetrics performing daily fetal heart monitoring. Current plan is to remain hospitalized for duration of treatment.   PLAN:  1. Continue vancomycin. 2. Monitor blood cultures for clearance of bacteremia.  3. Fetal heart rate monitor per Obstetrics. 4. Pain management and opioid use disorder per primary team.    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 sodium chloride   Intravenous Once  . acetaminophen  650 mg Oral Q6H  . Chlorhexidine Gluconate Cloth  6 each Topical Daily  . enoxaparin (LOVENOX) injection  60 mg Subcutaneous Q24H  . furosemide  40 mg Oral BID  . methadone  30 mg Oral TID PC  . polyethylene glycol  17 g Oral BID  . prenatal  vitamin w/FE, FA  1 tablet Oral Q1200  . senna-docusate  1 tablet Oral BID    SUBJECTIVE:  Afebrile overnight with continued right sided back/flank pain. Denies any lower extremity weakness or changes to sensation.  Allergies  Allergen Reactions  . Hydrocodone Itching  . Morphine And Related Nausea And Vomiting     Review of Systems: Review of Systems  Constitutional: Negative for chills, fever and weight loss.  Respiratory: Negative for cough, shortness of breath and wheezing.   Cardiovascular: Negative for chest pain and leg swelling.  Gastrointestinal: Negative for abdominal pain, constipation, diarrhea, nausea and vomiting.  Musculoskeletal: Positive for back pain.  Skin: Negative for rash.      OBJECTIVE: Vitals:   07/21/20 0500 07/21/20 0754 07/21/20 0919 07/21/20 1012  BP:  103/65 118/80   Pulse:  84 84 91  Resp:  16 16   Temp:  98.3 F (36.8 C) 98.1 F (36.7 C)   TempSrc:  Oral Oral   SpO2:  95% 93% 95%  Weight: 79 kg     Height:       Body mass index is 26.88 kg/m.  Physical Exam Constitutional:      General: She is not in acute distress.    Appearance: She is well-developed. She is ill-appearing.  Cardiovascular:     Rate and Rhythm: Normal rate and regular rhythm.     Heart sounds: Normal heart sounds.  Pulmonary:     Effort: Pulmonary effort is normal.  Breath sounds: Normal breath sounds.  Skin:    General: Skin is warm and dry.  Neurological:     Mental Status: She is alert and oriented to person, place, and time.  Psychiatric:        Behavior: Behavior normal.        Thought Content: Thought content normal.        Judgment: Judgment normal.     Lab Results Lab Results  Component Value Date   WBC 17.5 (H) 07/21/2020   HGB 10.9 (L) 07/21/2020   HCT 33.8 (L) 07/21/2020   MCV 91.1 07/21/2020   PLT 483 (H) 07/21/2020    Lab Results  Component Value Date   CREATININE 0.53 07/21/2020   BUN 18 07/21/2020   NA 130 (L) 07/21/2020    K 4.7 07/21/2020   CL 92 (L) 07/21/2020   CO2 28 07/21/2020    Lab Results  Component Value Date   ALT <5 07/17/2020   AST 11 (L) 07/17/2020   ALKPHOS 163 (H) 07/17/2020   BILITOT 0.7 07/17/2020     Microbiology: Recent Results (from the past 240 hour(s))  Resp Panel by RT-PCR (Flu A&B, Covid) Nasopharyngeal Swab     Status: None   Collection Time: 07/13/20 11:21 AM   Specimen: Nasopharyngeal Swab; Nasopharyngeal(NP) swabs in vial transport medium  Result Value Ref Range Status   SARS Coronavirus 2 by RT PCR NEGATIVE NEGATIVE Final    Comment: (NOTE) SARS-CoV-2 target nucleic acids are NOT DETECTED.  The SARS-CoV-2 RNA is generally detectable in upper respiratory specimens during the acute phase of infection. The lowest concentration of SARS-CoV-2 viral copies this assay can detect is 138 copies/mL. A negative result does not preclude SARS-Cov-2 infection and should not be used as the sole basis for treatment or other patient management decisions. A negative result may occur with  improper specimen collection/handling, submission of specimen other than nasopharyngeal swab, presence of viral mutation(s) within the areas targeted by this assay, and inadequate number of viral copies(<138 copies/mL). A negative result must be combined with clinical observations, patient history, and epidemiological information. The expected result is Negative.  Fact Sheet for Patients:  EntrepreneurPulse.com.au  Fact Sheet for Healthcare Providers:  IncredibleEmployment.be  This test is no t yet approved or cleared by the [place] FDA and  has been authorized for detection and/or diagnosis of SARS-CoV-2 by FDA under an Emergency Use Authorization (EUA). This EUA will remain  in effect (meaning this test can be used) for the duration of the COVID-19 declaration under Section 564(b)(1) of the Act, 21 U.S.C.section 360bbb-3(b)(1), unless the  authorization is terminated  or revoked sooner.       Influenza A by PCR NEGATIVE NEGATIVE Final   Influenza B by PCR NEGATIVE NEGATIVE Final    Comment: (NOTE) The Xpert Xpress SARS-CoV-2/FLU/RSV plus assay is intended as an aid in the diagnosis of influenza from Nasopharyngeal swab specimens and should not be used as a sole basis for treatment. Nasal washings and aspirates are unacceptable for Xpert Xpress SARS-CoV-2/FLU/RSV testing.  Fact Sheet for Patients: EntrepreneurPulse.com.au  Fact Sheet for Healthcare Providers: IncredibleEmployment.be  This test is not yet approved or cleared by the [place] FDA and has been authorized for detection and/or diagnosis of SARS-CoV-2 by FDA under an Emergency Use Authorization (EUA). This EUA will remain in effect (meaning this test can be used) for the duration of the COVID-19 declaration under Section 564(b)(1) of the Act, 21 U.S.C. section 360bbb-3(b)(1), unless the authorization  is terminated or revoked.  Performed at [facility], [place] [address], [place], [place] [address]   Culture, blood (routine x 2)     Status: Abnormal   Collection Time: 07/13/20 12:20 PM   Specimen: BLOOD  Result Value Ref Range Status   Specimen Description BLOOD SITE NOT SPECIFIED  Final   Special Requests   Final    BOTTLES DRAWN AEROBIC AND ANAEROBIC Blood Culture adequate volume   Culture  Setup Time   Final    GRAM POSITIVE COCCI IN CHAINS IN BOTH AEROBIC AND ANAEROBIC BOTTLES Organism ID to follow CRITICAL RESULT CALLED TO, READ BACK BY AND VERIFIED WITH: Performed at [facility], [place] [address], [place], [place] [address]    Culture METHICILLIN RESISTANT STAPHYLOCOCCUS AUREUS (A)  Final   Report Status 07/16/2020 FINAL  Final   Organism ID, Bacteria METHICILLIN RESISTANT STAPHYLOCOCCUS AUREUS  Final      Susceptibility   Methicillin resistant staphylococcus aureus - MIC*    CIPROFLOXACIN  <=0.5 SENSITIVE Sensitive     ERYTHROMYCIN >=8 RESISTANT Resistant     GENTAMICIN <=0.5 SENSITIVE Sensitive     OXACILLIN >=4 RESISTANT Resistant     TETRACYCLINE <=1 SENSITIVE Sensitive     VANCOMYCIN <=0.5 SENSITIVE Sensitive     TRIMETH/SULFA <=10 SENSITIVE Sensitive     CLINDAMYCIN <=0.25 SENSITIVE Sensitive     RIFAMPIN <=0.5 SENSITIVE Sensitive     Inducible Clindamycin NEGATIVE Sensitive     * METHICILLIN RESISTANT STAPHYLOCOCCUS AUREUS  Blood Culture ID Panel (Reflexed)     Status: Abnormal   Collection Time: 07/13/20 12:20 PM  Result Value Ref Range Status   Enterococcus faecalis NOT DETECTED NOT DETECTED Final   Enterococcus Faecium NOT DETECTED NOT DETECTED Final   Listeria monocytogenes NOT DETECTED NOT DETECTED Final   Staphylococcus species DETECTED (A) NOT DETECTED Final    Comment: CRITICAL RESULT CALLED TO, READ BACK BY AND VERIFIED WITH: PHARMD AMEND CAREN BY MESSAN H. AT 0120 ON 07/14/2020    Staphylococcus aureus (BCID) DETECTED (A) NOT DETECTED Final    Comment: Methicillin (oxacillin)-resistant Staphylococcus aureus (MRSA). MRSA is predictably resistant to beta-lactam antibiotics (except ceftaroline). Preferred therapy is vancomycin unless clinically contraindicated. Patient requires contact precautions if  hospitalized. CRITICAL RESULT CALLED TO, READ BACK BY AND VERIFIED WITH: PHARMD AMEND CAREN BY MESSAN H. AT 0120 ON 07/14/2020    Staphylococcus epidermidis NOT DETECTED NOT DETECTED Final   Staphylococcus lugdunensis NOT DETECTED NOT DETECTED Final   Streptococcus species NOT DETECTED NOT DETECTED Final   Streptococcus agalactiae NOT DETECTED NOT DETECTED Final   Streptococcus pneumoniae NOT DETECTED NOT DETECTED Final   Streptococcus pyogenes NOT DETECTED NOT DETECTED Final   A.calcoaceticus-baumannii NOT DETECTED NOT DETECTED Final   Bacteroides fragilis NOT DETECTED NOT DETECTED Final   Enterobacterales NOT DETECTED NOT DETECTED Final   Enterobacter  cloacae complex NOT DETECTED NOT DETECTED Final   Escherichia coli NOT DETECTED NOT DETECTED Final   Klebsiella aerogenes NOT DETECTED NOT DETECTED Final   Klebsiella oxytoca NOT DETECTED NOT DETECTED Final   Klebsiella pneumoniae NOT DETECTED NOT DETECTED Final   Proteus species NOT DETECTED NOT DETECTED Final   Salmonella species NOT DETECTED NOT DETECTED Final   Serratia marcescens NOT DETECTED NOT DETECTED Final   Haemophilus influenzae NOT DETECTED NOT DETECTED Final   Neisseria meningitidis NOT DETECTED NOT DETECTED Final   Pseudomonas aeruginosa NOT DETECTED NOT DETECTED Final   Stenotrophomonas maltophilia NOT DETECTED NOT DETECTED Final  Candida albicans NOT DETECTED NOT DETECTED Final   Candida auris NOT DETECTED NOT DETECTED Final   Candida glabrata NOT DETECTED NOT DETECTED Final   Candida krusei NOT DETECTED NOT DETECTED Final   Candida parapsilosis NOT DETECTED NOT DETECTED Final   Candida tropicalis NOT DETECTED NOT DETECTED Final   Cryptococcus neoformans/gattii NOT DETECTED NOT DETECTED Final   Meth resistant mecA/C and MREJ DETECTED (A) NOT DETECTED Final    Comment: CRITICAL RESULT CALLED TO, READ BACK BY AND VERIFIED WITH: PHARMD AMEND CAREN BY MESSAN H. AT 0120 ON 07/14/2020 Performed at [place] [address] [place], [place] [address]   Culture, blood (routine x 2)     Status: Abnormal   Collection Time: 07/13/20 12:21 PM   Specimen: BLOOD  Result Value Ref Range Status   Specimen Description BLOOD SITE NOT SPECIFIED  Final   Special Requests   Final    BOTTLES DRAWN AEROBIC AND ANAEROBIC Blood Culture adequate volume   Culture  Setup Time   Final    GRAM POSITIVE COCCI IN BOTH AEROBIC AND ANAEROBIC BOTTLES CRITICAL VALUE NOTED.  VALUE IS CONSISTENT WITH PREVIOUSLY REPORTED AND CALLED VALUE.    Culture (A)  Final    STAPHYLOCOCCUS AUREUS SUSCEPTIBILITIES PERFORMED ON PREVIOUS CULTURE WITHIN THE LAST 5 DAYS. Performed at [place], [place] [address] [place], [place] [address]    Report Status 07/16/2020 FINAL  Final  MRSA PCR Screening     Status: Abnormal   Collection Time: 07/13/20  8:37 PM   Specimen: Nasal Mucosa; Nasopharyngeal  Result Value Ref Range Status   MRSA by PCR POSITIVE (A) NEGATIVE Final    Comment:        The GeneXpert MRSA Assay (FDA approved for NASAL specimens only), is one component of a comprehensive MRSA colonization surveillance program. It is not intended to diagnose MRSA infection nor to guide or monitor treatment for MRSA infections. RESULT CALLED TO, READ BACK BY AND VERIFIED WITH: TOLER,M RN 07/13/2020 AT 2319 SKEEN,P Performed at [facility], [place] [address], [place], [place] [address]   Urine culture     Status: None   Collection Time: 07/13/20 10:10 PM   Specimen: Urine, Clean Catch  Result Value Ref Range Status   Specimen Description URINE, CLEAN CATCH  Final   Special Requests NONE  Final   Culture   Final    NO GROWTH Performed at [facility], [address], [place], [place] [address]    Report Status 07/15/2020 FINAL  Final  Culture, blood (routine x 2)     Status: None   Collection Time: 07/15/20  6:49 PM   Specimen: BLOOD  Result Value Ref Range Status   Specimen Description BLOOD RIGHT ANTECUBITAL  Final   Special Requests   Final    BOTTLES DRAWN AEROBIC AND ANAEROBIC BACTEROIDES CACCAE   Culture   Final    NO GROWTH 5 DAYS Performed at [facility], [place] [address] [place], [place] [address]    Report Status 07/20/2020 FINAL  Final  Culture, blood (routine x 2)     Status: None   Collection Time: 07/15/20  6:50 PM   Specimen: BLOOD  Result Value Ref Range Status   Specimen Description BLOOD BLOOD RIGHT FOREARM  Final   Special Requests   Final    BOTTLES DRAWN AEROBIC AND ANAEROBIC Blood Culture adequate volume   Culture   Final  NO GROWTH 5 DAYS Performed at [facility], [place] [address], [place], [place] [address]     Report Status 07/20/2020 FINAL  Final     Terri Piedra, NP [place] for Infectious Disease [facility]  07/21/2020  11:46 AM

## 2020-07-21 NOTE — Progress Notes (Signed)
OBR[facility] RN arrived to complete NST and patient refusing at this time. Pt. States she won't allow it until she gets pain medication. Kim, RN notified and states she has medication due at [place] and to come after that. Dr. Elgie Congo attending OBGYN notified of patient refusal at this time.

## 2020-07-21 NOTE — Progress Notes (Signed)
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 -Current active IV heroin user, uses about 3 g daily and IV fentanyl, presented to the ED with 3 to 4 weeks history of bilateral lower extremity edema associated with worsening dyspnea on exertion for 4 days, intermittent fever for several weeks and pain with deep inspiration across her right lateral chest and flank. -In the ED she was noted to be fluid overload in addition blood cultures were positive for MRSA and she was a started on IV vancomycin. -2D echo without obvious vegetation, TEE was deferred and plan is to treat empirically for endocarditis -Patient was also found to be 26 weeks Pregnancy.   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)   Tricuspid regurgitation   Preexisting hypertension complicating pregnancy, antepartum   Dental caries   Endocarditis of tricuspid valve   History of VBAC   Lumbar back pain   Acute on chronic congestive heart failure (HCC)   Cellulitis of right lower extremity   Septic embolism (HCC)   Syphilis   [redacted] weeks gestation of pregnancy   Flank pain   Drug use affecting pregnancy in second trimester   Maternal endocarditis affecting pregnancy in second trimester, antepartum   Methadone maintenance treatment affecting pregnancy ([place])  1-MRSA  bacteremia, presumed endocarditis: -Continue with IV vancomycin, ID following. -2D echo without obvious vegetation, TEE defer, plan to empirically treat as endocarditis. -Repeated blood cultures: So far negative. -Patient will probably need 6 weeks of IV antibiotics inpatient. -Continue IV antibiotics.   2-Acute on chronic Right side Heart Failure -Severe TR -Severe pulmonary hypertension -2D echo noted decreased RV systolic function, elevated PA pressure 92 severe TR. -Appreciate heart failure team management -Transition to oral lasix today.  appreciate cardiology assistance.  --13 L  3-Chronic Anemia: -Anemia panel: B12 445, folate 7.4, iron 32, ferritin 111. -Hemoglobin baseline 8.5-9.5 -Hb stable.   4-IV heroin and fentanyl abuse Chronic pain Continue with methadone 30 mg 3 times daily IV Dilaudid as well. Will need to start titration dilaudid off  5-26 weeks pregnancy: -OB service following, getting daily fetal heart assessment. -counseling provided to patient regarding pain medication and effect to fetus.   6-Pneumonia: Possible septic pulmonary infarcts. 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 -Given penicillin 2.4 MU intramuscular  8-back pain: Unable to tolerate MRI, discontinue with ID 10-hyponatremia: monitor on Lasix   Estimated body mass index is 26.88 kg/m as calculated from the following:   Height as of this encounter: 5' 7.5" (1.715 m).   Weight as of this encounter: 79 kg.   DVT prophylaxis: lovenox Code Status: Full cdoe Family Communication: care discussed with patient/ Disposition Plan:  Status is: Inpatient  Remains inpatient appropriate because:IV treatments appropriate due to intensity of illness or inability to take PO   Dispo: The patient is from: Home  Anticipated d/c is to: Home              Patient currently is not medically stable to d/c.   Difficult to  place patient No        Consultants:   ID  GYN  Cardiology   Procedures:   None  Antimicrobials:  IV Vancomycin   Subjective: continue to complaint of back pain, feel baby moving.   Objective: Vitals:   07/21/20 0500 07/21/20 0754 07/21/20 0919 07/21/20 1012  BP:  103/65 118/80   Pulse:  84 84 91  Resp:  16 16   Temp:  98.3 F (36.8 C) 98.1 F (36.7 C)   TempSrc:  Oral Oral   SpO2:  95% 93% 95%  Weight: 79 kg     Height:        Intake/Output Summary (Last 24 hours) at 07/21/2020 1703 Last data filed at 07/21/2020 0600 Gross per 24 hour  Intake 380 ml  Output 820 ml  Net -440 ml   Filed Weights   07/17/20 0431 07/19/20 0519 07/21/20 0500  Weight: 91.8 kg 84.4 kg 79 kg    Examination:  General exam: NAD Respiratory system: BL crackles.  Cardiovascular system: S 1, S 2 RRR Gastrointestinal system: BS present, soft, nt GU:  Gravid uterus  Central nervous system: alert. Extremities: Symmetric power    Data Reviewed: I have personally reviewed following labs and imaging studies  CBC: Recent Labs  Lab 07/16/20 0333 07/17/20 0622 07/18/20 0500 07/20/20 0500 07/21/20 1004  WBC 13.0* 11.8* 12.5* 17.3* 17.5*  HGB 8.3* 7.5* 8.4* 9.4* 10.9*  HCT 26.2* 23.6* 26.5* 29.2* 33.8*  MCV 94.6 94.4 94.3 91.5 91.1  PLT 301 299 317 426* 161*   Basic Metabolic Panel: Recent Labs  Lab 07/14/20 1947 07/15/20 0416 07/16/20 0333 07/17/20 0622 07/18/20 0500 07/19/20 1112 07/20/20 0500 07/21/20 1004  NA  --  132*   < > 133* 131* 130* 129* 130*  K  --  3.9   < > 4.0 3.8 4.7 4.5 4.7  CL  --  96*   < > 97* 93* 93* 90* 92*  CO2  --  26   < > 27 27 28 27 28   GLUCOSE  --  88   < > 81 95 80 85 90  BUN  --  9   < > 7 8 8 11 18   CREATININE  --  0.50   < > 0.47 0.52 0.51 0.46 0.53  CALCIUM  --  8.4*   < > 8.2* 8.6* 9.2 9.2 9.8  MG 1.8 1.6*  --   --   --   --  1.8  --   PHOS  --  2.4*  --   --   --   --   --   --    < > = values in this interval not  displayed.   GFR: Estimated Creatinine Clearance: 107.4 mL/min (by C-G formula based on SCr of 0.53 mg/dL). Liver Function Tests: Recent Labs  Lab 07/16/20 0333 07/17/20 0622  AST 11* 11*  ALT 6 <5  ALKPHOS 130* 163*  BILITOT 0.6 0.7  PROT 6.0* 6.1*  ALBUMIN 1.7* 1.7*   No results for input(s): LIPASE, AMYLASE in the last 168 hours. No results for input(s): AMMONIA in the last 168 hours. Coagulation Profile: No results for input(s): INR, PROTIME in the last 168 hours. Cardiac Enzymes: No results for input(s): CKTOTAL, CKMB, CKMBINDEX, TROPONINI in the last 168  hours. BNP (last 3 results) No results for input(s): PROBNP in the last 8760 hours. HbA1C: No results for input(s): HGBA1C in the last 72 hours. CBG: Recent Labs  Lab 07/21/20 1210  GLUCAP 121*   Lipid Profile: No results for input(s): CHOL, HDL, LDLCALC, TRIG, CHOLHDL, LDLDIRECT in the last 72 hours. Thyroid Function Tests: No results for input(s): TSH, T4TOTAL, FREET4, T3FREE, THYROIDAB in the last 72 hours. Anemia Panel: No results for input(s): VITAMINB12, FOLATE, FERRITIN, TIBC, IRON, RETICCTPCT in the last 72 hours. Sepsis Labs: No results for input(s): PROCALCITON, LATICACIDVEN in the last 168 hours.  Recent Results (from the past 240 hour(s))  Resp Panel by RT-PCR (Flu A&B, Covid) Nasopharyngeal Swab     Status: None   Collection Time: 07/13/20 11:21 AM   Specimen: Nasopharyngeal Swab; Nasopharyngeal(NP) swabs in vial transport medium  Result Value Ref Range Status   SARS Coronavirus 2 by RT PCR NEGATIVE NEGATIVE Final    Comment: (NOTE) SARS-CoV-2 target nucleic acids are NOT DETECTED.  The SARS-CoV-2 RNA is generally detectable in upper respiratory specimens during the acute phase of infection. The lowest concentration of SARS-CoV-2 viral copies this assay can detect is 138 copies/mL. A negative result does not preclude SARS-Cov-2 infection and should not be used as the sole basis for treatment  or other patient management decisions. A negative result may occur with  improper specimen collection/handling, submission of specimen other than nasopharyngeal swab, presence of viral mutation(s) within the areas targeted by this assay, and inadequate number of viral copies(<138 copies/mL). A negative result must be combined with clinical observations, patient history, and epidemiological information. The expected result is Negative.  Fact Sheet for Patients:  EntrepreneurPulse.com.au  Fact Sheet for Healthcare Providers:  IncredibleEmployment.be  This test is no t yet approved or cleared by the [place] FDA and  has been authorized for detection and/or diagnosis of SARS-CoV-2 by FDA under an Emergency Use Authorization (EUA). This EUA will remain  in effect (meaning this test can be used) for the duration of the COVID-19 declaration under Section 564(b)(1) of the Act, 21 U.S.C.section 360bbb-3(b)(1), unless the authorization is terminated  or revoked sooner.       Influenza A by PCR NEGATIVE NEGATIVE Final   Influenza B by PCR NEGATIVE NEGATIVE Final    Comment: (NOTE) The Xpert Xpress SARS-CoV-2/FLU/RSV plus assay is intended as an aid in the diagnosis of influenza from Nasopharyngeal swab specimens and should not be used as a sole basis for treatment. Nasal washings and aspirates are unacceptable for Xpert Xpress SARS-CoV-2/FLU/RSV testing.  Fact Sheet for Patients: EntrepreneurPulse.com.au  Fact Sheet for Healthcare Providers: IncredibleEmployment.be  This test is not yet approved or cleared by the [place] FDA and has been authorized for detection and/or diagnosis of SARS-CoV-2 by FDA under an Emergency Use Authorization (EUA). This EUA will remain in effect (meaning this test can be used) for the duration of the COVID-19 declaration under Section 564(b)(1) of the Act, 21 U.S.C. section  360bbb-3(b)(1), unless the authorization is terminated or revoked.  Performed at [facility], [place] [address], [place], [place] [address]   Culture, blood (routine x 2)     Status: Abnormal   Collection Time: 07/13/20 12:20 PM   Specimen: BLOOD  Result Value Ref Range Status   Specimen Description BLOOD SITE NOT SPECIFIED  Final   Special Requests   Final    BOTTLES DRAWN AEROBIC AND ANAEROBIC Blood Culture adequate volume   Culture  Setup Time  Final    GRAM POSITIVE COCCI IN CHAINS IN BOTH AEROBIC AND ANAEROBIC BOTTLES Organism ID to follow CRITICAL RESULT CALLED TO, READ BACK BY AND VERIFIED WITH: Performed at [facility], [place] [address], [place], [place] [address]    Culture METHICILLIN RESISTANT STAPHYLOCOCCUS AUREUS (A)  Final   Report Status 07/16/2020 FINAL  Final   Organism ID, Bacteria METHICILLIN RESISTANT STAPHYLOCOCCUS AUREUS  Final      Susceptibility   Methicillin resistant staphylococcus aureus - MIC*    CIPROFLOXACIN <=0.5 SENSITIVE Sensitive     ERYTHROMYCIN >=8 RESISTANT Resistant     GENTAMICIN <=0.5 SENSITIVE Sensitive     OXACILLIN >=4 RESISTANT Resistant     TETRACYCLINE <=1 SENSITIVE Sensitive     VANCOMYCIN <=0.5 SENSITIVE Sensitive     TRIMETH/SULFA <=10 SENSITIVE Sensitive     CLINDAMYCIN <=0.25 SENSITIVE Sensitive     RIFAMPIN <=0.5 SENSITIVE Sensitive     Inducible Clindamycin NEGATIVE Sensitive     * METHICILLIN RESISTANT STAPHYLOCOCCUS AUREUS  Blood Culture ID Panel (Reflexed)     Status: Abnormal   Collection Time: 07/13/20 12:20 PM  Result Value Ref Range Status   Enterococcus faecalis NOT DETECTED NOT DETECTED Final   Enterococcus Faecium NOT DETECTED NOT DETECTED Final   Listeria monocytogenes NOT DETECTED NOT DETECTED Final   Staphylococcus species DETECTED (A) NOT DETECTED Final    Comment: CRITICAL RESULT CALLED TO, READ BACK BY AND VERIFIED WITH: PHARMD AMEND CAREN BY MESSAN H. AT 0120 ON 07/14/2020    Staphylococcus  aureus (BCID) DETECTED (A) NOT DETECTED Final    Comment: Methicillin (oxacillin)-resistant Staphylococcus aureus (MRSA). MRSA is predictably resistant to beta-lactam antibiotics (except ceftaroline). Preferred therapy is vancomycin unless clinically contraindicated. Patient requires contact precautions if  hospitalized. CRITICAL RESULT CALLED TO, READ BACK BY AND VERIFIED WITH: PHARMD AMEND CAREN BY MESSAN H. AT 0120 ON 07/14/2020    Staphylococcus epidermidis NOT DETECTED NOT DETECTED Final   Staphylococcus lugdunensis NOT DETECTED NOT DETECTED Final   Streptococcus species NOT DETECTED NOT DETECTED Final   Streptococcus agalactiae NOT DETECTED NOT DETECTED Final   Streptococcus pneumoniae NOT DETECTED NOT DETECTED Final   Streptococcus pyogenes NOT DETECTED NOT DETECTED Final   A.calcoaceticus-baumannii NOT DETECTED NOT DETECTED Final   Bacteroides fragilis NOT DETECTED NOT DETECTED Final   Enterobacterales NOT DETECTED NOT DETECTED Final   Enterobacter cloacae complex NOT DETECTED NOT DETECTED Final   Escherichia coli NOT DETECTED NOT DETECTED Final   Klebsiella aerogenes NOT DETECTED NOT DETECTED Final   Klebsiella oxytoca NOT DETECTED NOT DETECTED Final   Klebsiella pneumoniae NOT DETECTED NOT DETECTED Final   Proteus species NOT DETECTED NOT DETECTED Final   Salmonella species NOT DETECTED NOT DETECTED Final   Serratia marcescens NOT DETECTED NOT DETECTED Final   Haemophilus influenzae NOT DETECTED NOT DETECTED Final   Neisseria meningitidis NOT DETECTED NOT DETECTED Final   Pseudomonas aeruginosa NOT DETECTED NOT DETECTED Final   Stenotrophomonas maltophilia NOT DETECTED NOT DETECTED Final   Candida albicans NOT DETECTED NOT DETECTED Final   Candida auris NOT DETECTED NOT DETECTED Final   Candida glabrata NOT DETECTED NOT DETECTED Final   Candida krusei NOT DETECTED NOT DETECTED Final   Candida parapsilosis NOT DETECTED NOT DETECTED Final   Candida tropicalis NOT DETECTED NOT  DETECTED Final   Cryptococcus neoformans/gattii NOT DETECTED NOT DETECTED Final   Meth resistant mecA/C and MREJ DETECTED (A) NOT DETECTED Final    Comment: CRITICAL RESULT CALLED TO, READ BACK BY AND  VERIFIED WITH: PHARMD AMEND CAREN BY Boyd ON 07/14/2020 Performed at [facility], [place] [address], [place], [place] [address]   Culture, blood (routine x 2)     Status: Abnormal   Collection Time: 07/13/20 12:21 PM   Specimen: BLOOD  Result Value Ref Range Status   Specimen Description BLOOD SITE NOT SPECIFIED  Final   Special Requests   Final    BOTTLES DRAWN AEROBIC AND ANAEROBIC Blood Culture adequate volume   Culture  Setup Time   Final    GRAM POSITIVE COCCI IN BOTH AEROBIC AND ANAEROBIC BOTTLES CRITICAL VALUE NOTED.  VALUE IS CONSISTENT WITH PREVIOUSLY REPORTED AND CALLED VALUE.    Culture (A)  Final    STAPHYLOCOCCUS AUREUS SUSCEPTIBILITIES PERFORMED ON PREVIOUS CULTURE WITHIN THE LAST 5 DAYS. Performed at [facility], [place] [address] [place], [place] [address]    Report Status 07/16/2020 FINAL  Final  MRSA PCR Screening     Status: Abnormal   Collection Time: 07/13/20  8:37 PM   Specimen: Nasal Mucosa; Nasopharyngeal  Result Value Ref Range Status   MRSA by PCR POSITIVE (A) NEGATIVE Final    Comment:        The GeneXpert MRSA Assay (FDA approved for NASAL specimens only), is one component of a comprehensive MRSA colonization surveillance program. It is not intended to diagnose MRSA infection nor to guide or monitor treatment for MRSA infections. RESULT CALLED TO, READ BACK BY AND VERIFIED WITH: TOLER,M RN 07/13/2020 AT 2319 SKEEN,P Performed at [facility], [place] [address], [place], [place] [address]   Urine culture     Status: None   Collection Time: 07/13/20 10:10 PM   Specimen: Urine, Clean Catch  Result Value Ref Range Status   Specimen Description URINE, CLEAN CATCH  Final   Special Requests NONE  Final   Culture   Final    NO  GROWTH Performed at [facility], [address] [place], [place] [address]    Report Status 07/15/2020 FINAL  Final  Culture, blood (routine x 2)     Status: None   Collection Time: 07/15/20  6:49 PM   Specimen: BLOOD  Result Value Ref Range Status   Specimen Description BLOOD RIGHT ANTECUBITAL  Final   Special Requests   Final    BOTTLES DRAWN AEROBIC AND ANAEROBIC BACTEROIDES CACCAE   Culture   Final    NO GROWTH 5 DAYS Performed at [facility], [place] [address] [place], [place] [address]    Report Status 07/20/2020 FINAL  Final  Culture, blood (routine x 2)     Status: None   Collection Time: 07/15/20  6:50 PM   Specimen: BLOOD  Result Value Ref Range Status   Specimen Description BLOOD BLOOD RIGHT FOREARM  Final   Special Requests   Final    BOTTLES DRAWN AEROBIC AND ANAEROBIC Blood Culture adequate volume   Culture   Final    NO GROWTH 5 DAYS Performed at [facility], [place] [address] [place], [place] [address]    Report Status 07/20/2020 FINAL  Final         Radiology Studies: [place] MFM OB LIMITED  Result Date: 07/20/2020 ----------------------------------------------------------------------  OBSTETRICS REPORT                       (Signed Final 07/20/2020 05:40 pm) ---------------------------------------------------------------------- Patient Info  ID #:  [id number]                          D.O.B.:  March 24, 1986 (34 yrs)  Name:       Kelly KURIHARA              Visit Date: 07/20/2020 11:46 am ---------------------------------------------------------------------- Performed By  Attending:        Johnell Comings MD         Ref. Address:     [place], [place]  Performed By:     Berlinda Last          [facility]                    [place]  Referred By:      Osborne Oman MD ---------------------------------------------------------------------- Orders  #  Description                           Code        Ordered By  1  [place] MFM OB LIMITED                     [id number]    Verita Schneiders ----------------------------------------------------------------------  #  Order #                     Accession #                Episode #  1  [id number]                   [id number]                 [id number] ---------------------------------------------------------------------- Indications  Drug use complicating pregnancy, second        O99.322  trimester  [redacted] weeks gestation of pregnancy                Z3A.27  Advanced maternal age  multigravida 35+,        O09.522  second trimester  Previous cesarean delivery, antepartum x1      O34.219  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 Cord Insertion:      Marginal insertion  Amniotic Fluid  AFI FV:      Within normal limits                              Largest Pocket(cm)                              5.2 ---------------------------------------------------------------------- OB History  Gravidity:    7         Term:   3        Prem:   0        SAB:   3  TOP:          0       Ectopic:  0        Living: 3 ---------------------------------------------------------------------- Gestational Age  Best:          27w 1d     Det. By:  U/S  (07/13/20)          EDD:   10/18/20 ---------------------------------------------------------------------- Anatomy  Stomach:               Appears normal, left   Bladder:                Appears normal                          sided ---------------------------------------------------------------------- Cervix Uterus Adnexa  Cervix  Not visualized (advanced GA >24wks)  Adnexa  No abnormality visualized. 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  She is currently  treated with vancomycin and the plan is for her to complete a  6-week course of vancomycin as an inpatient.  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  She  is being followed by the heart failure team.  A limited ultrasound performed today shows that the fetus is  in the breech presentation.  There was normal amniotic fluid noted.  During the performance of her ultrasound today, a possible  dip in the fetal heart rate down to the low 100s was noted on  the M-mode Dopplers.  The actual heart rate was difficult to  decipher based on the M mode Doppler exam.  The patient cooperated with fetal heart rate monitoring this  afternoon.  The baseline fetal heart rate was noted to be in  the 140s, with moderate variability and small accelerations.  The heart rate tracing was reassuring  for her gestational age.  Due to the reported risk of fetal mortality (10% to 15%) in  pregnant women with infectious endocarditis, the plan will be  to continue daily fetal heart rate monitoring for 20 minutes  daily while she is hospitalized (if patient is cooperative).  We  will start weekly biophysical profiles and amniotic fluid checks  at 28 weeks. ----------------------------------------------------------------------                   Johnell Comings, MD Electronically Signed Final Report   07/20/2020 05:40 pm ----------------------------------------------------------------------       Scheduled Meds: .  sodium chloride   Intravenous Once  . acetaminophen  650 mg Oral Q6H  . Chlorhexidine Gluconate Cloth  6 each Topical Daily  . enoxaparin (LOVENOX) injection  60 mg Subcutaneous Q24H  . furosemide  40 mg Oral BID  . methadone  30 mg Oral TID PC  . polyethylene glycol  17 g Oral BID  . prenatal vitamin w/FE, FA  1 tablet Oral Q1200  . senna-docusate  1 tablet Oral BID   Continuous Infusions: . vancomycin 750 mg (07/21/20 1701)     LOS: 8 days    Time spent: 35 minutes    Daren Doswell A Johnmichael Melhorn, MD [facility]   If 7PM-7AM, please contact night-coverage [email or web address]  07/21/2020, 5:03 PM

## 2020-07-21 NOTE — Progress Notes (Signed)
Pt. Agreeable to NST at this time. Pt. Denies LOF/vag bleeding/ctx. Fetus had active movement during exam palpated by [facility] RN. Dr. Elgie Congo notified of FHR tracing and + RPR result. No new orders at this time.

## 2020-07-21 NOTE — Progress Notes (Addendum)
Advanced Heart Failure Rounding Note   Subjective:   Yesterday diuresed with IV lasix. I/O doesn't see accurate. Weight down 12 pounds.   Complaining of right sided rib and back pain .   ? related to septic embolization to her pregnancy vs lumbar spine disease. MRI ordered and later cancelled because she can't lie flat.  ID cancelled MRI.     Objective:   Weight Range:  Vital Signs:   Temp:  [97.9 F (36.6 C)-98.5 F (36.9 C)] 98.1 F (36.7 C) (03/10 0919) Pulse Rate:  [84-102] 84 (03/10 0919) Resp:  [16-20] 16 (03/10 0919) BP: (103-135)/(65-95) 118/80 (03/10 0919) SpO2:  [93 %-97 %] 93 % (03/10 0919) Weight:  [79 kg] 79 kg (03/10 0500) Last BM Date: 07/14/20  Weight change: Filed Weights   07/17/20 0431 07/19/20 0519 07/21/20 0500  Weight: 91.8 kg 84.4 kg 79 kg    Intake/Output:   Intake/Output Summary (Last 24 hours) at 07/21/2020 0934 Last data filed at 07/21/2020 0600 Gross per 24 hour  Intake 380 ml  Output 1120 ml  Net -740 ml     PHYSICAL EXAM: General:  In bed.  No resp difficulty HEENT: normal Neck: supple. JVP 7-8. Carotids 2+ bilat; no bruits. No lymphadenopathy or thryomegaly appreciated. Cor: PMI nondisplaced. Regular rate & rhythm. No rubs, gallops. TR 2/6 . Lungs: clear Abdomen: pregnant, soft, nontender, nondistended. No hepatosplenomegaly. No bruits or masses. Good bowel sounds. Extremities: no cyanosis, clubbing, rash, trace edema Neuro: alert & orientedx3, cranial nerves grossly intact. moves all 4 extremities w/o difficulty. Affect pleasant  Telemetry: NSR 90s personally reviewed.   Labs: Basic Metabolic Panel: Recent Labs  Lab 07/14/20 1947 07/15/20 0416 07/15/20 0416 07/16/20 0333 07/17/20 0622 07/18/20 0500 07/19/20 1112 07/20/20 0500  NA  --  132*   < > 132* 133* 131* 130* 129*  K  --  3.9   < > 4.1 4.0 3.8 4.7 4.5  CL  --  96*   < > 96* 97* 93* 93* 90*  CO2  --  26   < > 29 27 27 28 27   GLUCOSE  --  88   < > 82 81 95  80 85  BUN  --  9   < > 7 7 8 8 11   CREATININE  --  0.50   < > 0.51 0.47 0.52 0.51 0.46  CALCIUM  --  8.4*   < > 8.4* 8.2* 8.6* 9.2 9.2  MG 1.8 1.6*  --   --   --   --   --  1.8  PHOS  --  2.4*  --   --   --   --   --   --    < > = values in this interval not displayed.    Liver Function Tests: Recent Labs  Lab 07/16/20 0333 07/17/20 0622  AST 11* 11*  ALT 6 <5  ALKPHOS 130* 163*  BILITOT 0.6 0.7  PROT 6.0* 6.1*  ALBUMIN 1.7* 1.7*   No results for input(s): LIPASE, AMYLASE in the last 168 hours. No results for input(s): AMMONIA in the last 168 hours.  CBC: Recent Labs  Lab 07/16/20 0333 07/17/20 0622 07/18/20 0500 07/20/20 0500  WBC 13.0* 11.8* 12.5* 17.3*  HGB 8.3* 7.5* 8.4* 9.4*  HCT 26.2* 23.6* 26.5* 29.2*  MCV 94.6 94.4 94.3 91.5  PLT 301 299 317 426*    Cardiac Enzymes: No results for input(s): CKTOTAL, CKMB, CKMBINDEX, TROPONINI in the last  168 hours.  BNP: BNP (last 3 results) Recent Labs    07/13/20 1219  BNP 157.1*    ProBNP (last 3 results) No results for input(s): PROBNP in the last 8760 hours.    Other results:  Imaging: [place] MFM OB LIMITED  Result Date: 07/20/2020 ----------------------------------------------------------------------  OBSTETRICS REPORT                       (Signed Final 07/20/2020 05:40 pm) ---------------------------------------------------------------------- Patient Info  ID #:       [id number]                          D.O.B.:  [birth year] (34 yrs)  Name:       Kelly [name]              Visit Date: 07/20/2020 11:46 am ---------------------------------------------------------------------- Performed By  Attending:        Johnell Comings MD         Ref. Address:     [place], [place]  Performed By:     Berlinda Last           [facility]                    [place]  Referred By:      Osborne Oman MD ---------------------------------------------------------------------- Orders  #  Description                           Code        Ordered By  1  [place] MFM OB LIMITED                     56314.97    Verita Schneiders ----------------------------------------------------------------------  #  Order #                     Accession #  Episode #  1  [id number]                   [id number]                 [id number] ---------------------------------------------------------------------- Indications  Drug use complicating pregnancy, second        O99.322  trimester  [redacted] weeks gestation of pregnancy                Z3A.27  Advanced maternal age multigravida 1+,        O33.522  second trimester  Previous cesarean delivery, antepartum x1      O34.219  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 Cord Insertion:      Marginal insertion  Amniotic Fluid  AFI FV:      Within normal limits                              Largest Pocket(cm)                              5.2 ---------------------------------------------------------------------- OB History  Gravidity:    7         Term:   3        Prem:   0        SAB:   3  TOP:          0       Ectopic:  0        Living: 3 ---------------------------------------------------------------------- Gestational Age  Best:          27w 1d     Det. By:  U/S  (07/13/20)          EDD:   10/18/20 ---------------------------------------------------------------------- Anatomy  Stomach:                Appears normal, left   Bladder:                Appears normal                         sided ---------------------------------------------------------------------- Cervix Uterus Adnexa  Cervix  Not visualized (advanced GA >24wks)  Adnexa  No abnormality visualized. 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  She is currently  treated with vancomycin and the plan is for her to complete a  6-week course of vancomycin as an inpatient.  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  She  is being followed by the heart failure team.  A limited ultrasound performed today shows that the fetus is  in the breech presentation.  There was normal amniotic fluid  noted.  During the performance of her ultrasound today, a possible  dip in the fetal heart rate down to the low 100s was noted on  the M-mode Dopplers.  The actual heart rate was difficult to  decipher based on the M mode Doppler exam.  The patient cooperated with fetal heart rate monitoring this  afternoon.  The baseline fetal heart rate was noted to be in  the 140s, with moderate variability and small accelerations.  The heart rate tracing was reassuring for her gestational age.  Due to the reported risk of fetal mortality (10% to 15%) in  pregnant women with infectious endocarditis, the plan will be  to continue daily fetal heart rate monitoring for 20 minutes  daily while she is hospitalized (if patient is cooperative).  We  will start weekly biophysical profiles and amniotic fluid checks  at 28 weeks. ----------------------------------------------------------------------                   Johnell Comings, MD Electronically Signed Final Report   07/20/2020 05:40 pm  ----------------------------------------------------------------------    Medications:     Scheduled Medications: . sodium chloride   Intravenous Once  . acetaminophen  650 mg Oral Q6H  . Chlorhexidine Gluconate Cloth  6 each Topical Daily  . enoxaparin (LOVENOX) injection  60 mg Subcutaneous Q24H  . furosemide  40 mg Intravenous BID  . methadone  30 mg Oral TID PC  . polyethylene glycol  17 g Oral BID  . prenatal vitamin w/FE, FA  1 tablet Oral Q1200  . senna-docusate  1 tablet Oral BID    Infusions: . vancomycin 750 mg (07/21/20 0753)    PRN Medications: HYDROmorphone (DILAUDID) injection, prochlorperazine, sodium chloride flush   Assessment/Plan:   1. MRSA Sepsis  - bcx 2/2 + for MRSA - clinically improved with IV abx and volume resuscitation.  - ID has seen and adjusting abx. Will need 6 weeks.  - Bld Cx 3/4 - No growth.  - no obvious vegetation on valve on TTE but assume she has recurrent endocarditis and would treat for 6 weeks with IV abx. D/w ID and both agree that TEE would not change management at this point as valve known to be heavily damaged from previous episodes and currently not candidate for operative repair.  - Given ongoing IVDA will need to understand strategy of how she will get 6 weeks of IV abx safely.   2. Acute on Chronic Systolic CHF with Mostly Right Sided CHF (Likely Secondary to Severe TR) - Echo 07/13/20 showed normalization of LV function  LVEF of 55% with normal wall motion. RV severely enlarged with moderately reduced systolic function and severely elevated PASP of 92.49mHg. Severe TR noted but no vegetation visualized.  - Volume status improved. Already had IV lasix today. Will switch to po lasix 40 mg twice a day.  - Check BMET   3. 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 He was seen by Dr. GServando Snarein 05/2019 for possible  surgical repair. However, patient was lost to follow-up and unfortunately started using drugs again. - Echo this admission shows severe TR but no signs of vegetation.  4. 234w1dregnant - u/s ok - OB following - Case d/w high-risk OB team who is following with daily fetal monitoring  5. RPR + - ID sent confirmatory test - Given penicillin.   6. Polysubstance Abuse -Had using  IV heroine + fentanyl - On methadone.   7 Hyponatremia - likely due to hypervolemia - Check BMET    Will order BMET/ CBC for today.Switch to po lasix.  Plan to remain in the hospital for IV antibiotics.   Length of Stay: [place] NP-C  07/21/2020, 9:34 AM  Advanced Heart Failure Team Pager [phone] (M-F; 7a - 4p)  Please contact [facility] for night-coverage after hours (4p -7a ) and weekends on [email or web address]  Patient seen and examined with the above-signed Advanced Practice Provider and/or Housestaff. I personally reviewed laboratory data, imaging studies and relevant notes. I independently examined the patient and formulated the important aspects of the plan. I have edited the note to reflect any of my changes or salient points. I have personally discussed the plan with the patient and/or family.  Continues to c/o back pain. Unable to get cMRI. Diuresing well on IV lasix. Weight down 12 pounds.   General:  Weak appearing. No resp difficulty HEENT: normal Neck: supple. JVP 8  Carotids 2+ bilat; no bruits. No lymphadenopathy or thryomegaly appreciated. Cor: PMI nondisplaced. Regular rate & rhythm. 2/6 TR Lungs: clear Abdomen: + gravid  No hepatosplenomegaly. No bruits or masses. Good bowel sounds. Extremities: no cyanosis, clubbing, rash, tr edema Neuro: alert & orientedx3, cranial nerves grossly intact. moves all 4 extremities w/o difficulty. Affect pleasant  Volume status improving. Do not want to overdiurese in setting of pregnancy. Will switch to po lasix for now. Watch closely.   Agree  with plan to keep her in hospital for duration of IV abx and likely until delivery as well.   Glori Bickers, MD  5:03 PM

## 2020-07-21 NOTE — Evaluation (Signed)
Physical Therapy Evaluation Patient Details Name: Kelly [name] MRN: [id number] DOB: Jul 11, 1985 Today's Date: 07/21/2020   History of Present Illness  Pt is a 35 y.o. female admitted 07/13/20 with R-side abdominal pain, dyspnea, BLE edema; of note, pt is [redacted] weeks pregnant on admission. Echo reveals severe RV failure, severe TR. CXR concerning for PNA. Workup for MRSA bacteremia, presumed endocarditis, acute on chronic R-side HF. Per obstetrics, no acute OB concerns. PMH includes current IVDA/polysubstance abuse (relapsed 5 months ago, last use 3/2), epidural abscess and MSSA bacteremia (2019), HTN, bipolar disorder, ADD, scoliosis with spinal rods.    Clinical Impression  Pt presents with an overall decrease in functional mobility secondary to above. PTA, pt independent, reportedly has been staying at same hotel since last admission (2019). Today, pt endorses significant R-side abdominal pain and fatigue, requiring max encouragement to participate; able to ambulate short distance in room with RW and intermittent min guard for balance. Pt declined ambulation attempt without DME. Pt would benefit from continued acute PT services to maximize functional mobility and independence prior to d/c; unsure support available for pt and future newborn after hospitalization.    Follow Up Recommendations No PT follow up    Equipment Recommendations  Rolling walker with 5" wheels    Recommendations for Other Services       Precautions / Restrictions Precautions Precautions: Fall;Other (comment) Precaution Comments: Abdominal for comfort - pregancy, lumbar/abdominal pain Restrictions Weight Bearing Restrictions: No      Mobility  Bed Mobility Overal bed mobility: Modified Independent                  Transfers Overall transfer level: Needs assistance Equipment used: Rolling walker (2 wheeled) Transfers: Sit to/from Stand Sit to Stand: Supervision         General transfer comment: Pt  requesting use of RW; reliant on momentum to power into standing. Has been transferring from bed<>BSC independently  Ambulation/Gait Ambulation/Gait assistance: Min guard Gait Distance (Feet): 34 Feet Assistive device: Rolling walker (2 wheeled) Gait Pattern/deviations: Step-through pattern;Decreased stride length;Trunk flexed;Antalgic Gait velocity: Decreased   General Gait Details: Slow, fatigued, antalgic gait with RW and intermittent min guard for balance; pt c/o pain and fatigue, stopping to close eyes, requiring cues to keep walking; pt declined further distance secondary to pain/fatigue  Stairs            Wheelchair Mobility    Modified Rankin (Stroke Patients Only)       Balance Overall balance assessment: Needs assistance   Sitting balance-Leahy Scale: Fair       Standing balance-Leahy Scale: Fair Standing balance comment: Can static stand without UE support                             Pertinent Vitals/Pain Pain Assessment: Faces Faces Pain Scale: Hurts whole lot Pain Location: R-side abdominal/back pain Pain Descriptors / Indicators: Discomfort;Grimacing;Guarding Pain Intervention(s): Limited activity within patient's tolerance    Home Living Family/patient expects to be discharged to:: Shelter/Homeless                 Additional Comments: Per pt, has been staying at same hotel since prior admission (12/2017) -- does not have plan for where she will stay at d/c    Prior Function Level of Independence: Independent         Comments: No longer has RW she was d/c with after last admission (2019)  Hand Dominance        Extremity/Trunk Assessment   Upper Extremity Assessment Upper Extremity Assessment: Overall WFL for tasks assessed    Lower Extremity Assessment Lower Extremity Assessment: Generalized weakness       Communication   Communication: No difficulties  Cognition Arousal/Alertness:  Awake/alert;Lethargic Behavior During Therapy: Flat affect Overall Cognitive Status: No family/caregiver present to determine baseline cognitive functioning                                 General Comments: WFL for simple tasks, although focused on pain and fatigue, stating, "I won't be able to do much with this pain... I'm just so tired"      General Comments General comments (skin integrity, edema, etc.): SpO2 95-98% on RA; pt requesting use of [place] secondary to "inability" to breathe when laying down despite HOB elevated; HR 100s. Pt reports wanting to stay on PT's caseload while admitted, just too tired to do more today    Exercises     Assessment/Plan    PT Assessment Patient needs continued PT services  PT Problem List Decreased strength;Decreased activity tolerance;Decreased balance;Decreased mobility;Decreased knowledge of precautions;Pain       PT Treatment Interventions DME instruction;Gait training;Stair training;Functional mobility training;Therapeutic activities;Therapeutic exercise;Balance training;Patient/family education    PT Goals (Current goals can be found in the Care Plan section)  Acute Rehab PT Goals Patient Stated Goal: "I just need to get some sleep" PT Goal Formulation: With patient Time For Goal Achievement: 08/04/20 Potential to Achieve Goals: Good    Frequency Min 3X/week   Barriers to discharge Decreased caregiver support;Inaccessible home environment      Co-evaluation               AM-PAC PT "6 Clicks" Mobility  Outcome Measure Help needed turning from your back to your side while in a flat bed without using bedrails?: None Help needed moving from lying on your back to sitting on the side of a flat bed without using bedrails?: None Help needed moving to and from a bed to a chair (including a wheelchair)?: A Little Help needed standing up from a chair using your arms (e.g., wheelchair or bedside chair)?: A Little Help needed  to walk in hospital room?: A Little Help needed climbing 3-5 steps with a railing? : A Little 6 Click Score: 20    End of Session   Activity Tolerance: Patient limited by fatigue;Patient limited by pain Patient left: in bed;with call bell/phone within reach Nurse Communication: Mobility status PT Visit Diagnosis: Other abnormalities of gait and mobility (R26.89);Pain    Time: 8676-1950 PT Time Calculation (min) (ACUTE ONLY): 18 min   Charges:   PT Evaluation $PT Eval Moderate Complexity: 1 Mod        Mabeline Caras, PT, DPT Acute Rehabilitation Services  Pager [phone] Office [place] 07/21/2020, 12:24 PM

## 2020-07-21 NOTE — Plan of Care (Signed)
  Problem: Activity: Goal: Risk for activity intolerance will decrease Outcome: Progressing   Problem: Nutrition: Goal: Adequate nutrition will be maintained Outcome: Progressing   

## 2020-07-22 ENCOUNTER — Inpatient Hospital Stay (HOSPITAL_COMMUNITY): Payer: Medicaid Other

## 2020-07-22 DIAGNOSIS — Z3A27 27 weeks gestation of pregnancy: Secondary | ICD-10-CM | POA: Diagnosis not present

## 2020-07-22 DIAGNOSIS — O99413 Diseases of the circulatory system complicating pregnancy, third trimester: Secondary | ICD-10-CM

## 2020-07-22 DIAGNOSIS — Z3A26 26 weeks gestation of pregnancy: Secondary | ICD-10-CM | POA: Diagnosis not present

## 2020-07-22 DIAGNOSIS — O99891 Other specified diseases and conditions complicating pregnancy: Secondary | ICD-10-CM | POA: Diagnosis not present

## 2020-07-22 DIAGNOSIS — I519 Heart disease, unspecified: Secondary | ICD-10-CM

## 2020-07-22 DIAGNOSIS — I50811 Acute right heart failure: Secondary | ICD-10-CM | POA: Diagnosis not present

## 2020-07-22 DIAGNOSIS — B9562 Methicillin resistant Staphylococcus aureus infection as the cause of diseases classified elsewhere: Secondary | ICD-10-CM | POA: Diagnosis not present

## 2020-07-22 DIAGNOSIS — I38 Endocarditis, valve unspecified: Secondary | ICD-10-CM

## 2020-07-22 DIAGNOSIS — O99322 Drug use complicating pregnancy, second trimester: Secondary | ICD-10-CM | POA: Diagnosis not present

## 2020-07-22 DIAGNOSIS — R7881 Bacteremia: Secondary | ICD-10-CM | POA: Diagnosis not present

## 2020-07-22 LAB — BASIC METABOLIC PANEL
Anion gap: 9 (ref 5–15)
BUN: 20 mg/dL (ref 6–20)
CO2: 30 mmol/L (ref 22–32)
Calcium: 9.6 mg/dL (ref 8.9–10.3)
Chloride: 92 mmol/L — ABNORMAL LOW (ref 98–111)
Creatinine, Ser: 0.59 mg/dL (ref 0.44–1.00)
GFR, Estimated: >60 mL/min (ref >60)
Glucose, Bld: 86 mg/dL (ref 70–99)
Potassium: 4.5 mmol/L (ref 3.5–5.1)
Sodium: 131 mmol/L — ABNORMAL LOW (ref 135–145)

## 2020-07-22 LAB — CBC
HCT: 33 % — ABNORMAL LOW (ref 36.0–46.0)
Hemoglobin: 10.7 g/dL — ABNORMAL LOW (ref 12.0–15.0)
MCH: 29.5 pg (ref 26.0–34.0)
MCHC: 32.4 g/dL (ref 30.0–36.0)
MCV: 90.9 fL (ref 80.0–100.0)
Platelets: 466 10*3/uL — ABNORMAL HIGH (ref 150–400)
RBC: 3.63 MIL/uL — ABNORMAL LOW (ref 3.87–5.11)
RDW: 14.2 % (ref 11.5–15.5)
WBC: 16.6 10*3/uL — ABNORMAL HIGH (ref 4.0–10.5)
nRBC: 0 % (ref 0.0–0.2)

## 2020-07-22 LAB — MAGNESIUM: Magnesium: 1.9 mg/dL (ref 1.7–2.4)

## 2020-07-22 LAB — VANCOMYCIN, PEAK: Vancomycin Pk: 29 ug/mL — ABNORMAL LOW (ref 30–40)

## 2020-07-22 LAB — VANCOMYCIN, TROUGH: Vancomycin Tr: 13 ug/mL — ABNORMAL LOW (ref 15–20)

## 2020-07-22 MED ORDER — MAGIC MOUTHWASH W/LIDOCAINE
5.0000 mL | Freq: Three times a day (TID) | ORAL | Status: DC | PRN
  Administered 2020-07-22 – 2020-08-23 (×13): 5 mL via ORAL
  Filled 2020-07-22 (×17): qty 5

## 2020-07-22 MED ORDER — FUROSEMIDE 40 MG PO TABS
40.0000 mg | ORAL_TABLET | Freq: Every day | ORAL | Status: DC
  Administered 2020-07-23 – 2020-08-25 (×34): 40 mg via ORAL
  Filled 2020-07-22 (×34): qty 1

## 2020-07-22 MED ORDER — CIPROFLOXACIN HCL 0.3 % OP SOLN: 1.0000 [drp] | OPHTHALMIC | Status: DC

## 2020-07-22 MED ORDER — BISACODYL 10 MG RE SUPP
10.0000 mg | Freq: Once | RECTAL | Status: AC
  Administered 2020-07-22: 10 mg via RECTAL
  Filled 2020-07-22: qty 1

## 2020-07-22 MED ORDER — AMOXICILLIN-POT CLAVULANATE 875-125 MG PO TABS
1.0000 | ORAL_TABLET | Freq: Two times a day (BID) | ORAL | Status: AC
  Administered 2020-07-22 – 2020-07-31 (×20): 1 via ORAL
  Filled 2020-07-22 (×20): qty 1

## 2020-07-22 NOTE — Progress Notes (Signed)
NST completed. FHR tracing reported to Dr. Kennon Rounds attending OBGYN. Pt. Sleeping during exam. No ctx reported or palpated. Pt. Denies LOF and vaginal bleeding.

## 2020-07-22 NOTE — Progress Notes (Signed)
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 -Current active IV heroin user, uses about 3 g daily and IV fentanyl, presented to the ED with 3 to 4 weeks history of bilateral lower extremity edema associated with worsening dyspnea on exertion for 4 days, intermittent fever for several weeks and pain with deep inspiration across her right lateral chest and flank. -In the ED she was noted to be fluid overload in addition blood cultures were positive for MRSA and she was a started on IV vancomycin. -2D echo without obvious vegetation, TEE was deferred and plan is to treat empirically for endocarditis -Patient was also found to be 26 weeks Pregnancy.   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)   Tricuspid regurgitation   Preexisting hypertension complicating pregnancy, antepartum   Dental caries   Endocarditis of tricuspid valve   History of VBAC   Lumbar back pain   Acute on chronic congestive heart failure (HCC)   Cellulitis of right lower extremity   Septic embolism (HCC)   Syphilis   [redacted] weeks gestation of pregnancy   Flank pain   Drug use affecting pregnancy in second trimester   Maternal endocarditis affecting pregnancy in second trimester, antepartum   Methadone maintenance treatment affecting pregnancy ([place])   Heart  disease in mother complicating pregnancy  1-MRSA bacteremia, presumed endocarditis: -Continue with IV vancomycin, ID following. -2D echo without obvious vegetation, TEE defer, plan to empirically treat as endocarditis. -Repeated blood cultures: So far negative. -Patient will probably need 6 weeks of IV antibiotics inpatient. -Continue IV antibiotics.   2-Acute on chronic Right side Heart Failure -Severe TR -Severe pulmonary hypertension -2D echo noted decreased RV systolic function, elevated PA pressure 92 severe TR. -Appreciate heart failure team management -Transition to oral lasix 3-12.  Appreciate cardiology assistance.  --13 L  3-Chronic Anemia: -Anemia panel: B12 445, folate 7.4, iron 32, ferritin 111. -Hemoglobin baseline 8.5-9.5 -Hb stable. At 10  4-IV heroin and fentanyl abuse Chronic pain Continue with methadone 30 mg 3 times daily IV Dilaudid as well.   5-26 weeks pregnancy: -OB service following, getting daily fetal heart assessment. -counseling provided to patient regarding pain medication and effect to fetus.   6-Pneumonia: Possible septic pulmonary infarcts. 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 -Given penicillin 2.4 MU intramuscular  8-back pain: Unable to tolerate MRI, discontinue with ID 10-hyponatremia: monitor on Lasix 11-jaw pain, ear pain. Started on Augmentin. Order Mouth wash. Orthopantogram : multiples caries. Will try to contact Dentist on call.    Estimated body mass index is 27.69 kg/m as calculated from the following:   Height as of this encounter: 5' 7.5" (1.715 m).   Weight as of this encounter: 81.4 kg.   DVT prophylaxis: lovenox Code Status: Full cdoe Family Communication: care discussed with patient/ Disposition Plan:  Status is: Inpatient  Remains inpatient appropriate because:IV treatments appropriate due to intensity of illness or inability to take  PO   Dispo: The patient is from: Home              Anticipated d/c is to: Home              Patient currently is not medically stable to d/c.   Difficult to place patient No        Consultants:   ID  GYN  Cardiology   Procedures:   None  Antimicrobials:  IV Vancomycin   Subjective: Denies worsening dyspnea.  Complaining of mouth pain, jaw pain.   Objective: Vitals:   07/22/20 0016 07/22/20 0459 07/22/20 0500 07/22/20 0832  BP: 109/64 125/87  (!) 119/92  Pulse: 91 77  87  Resp:  18  18  Temp:  97.9 F (36.6 C)  97.9 F (36.6 C)  TempSrc:  Oral  Oral  SpO2: 94% 97%    Weight:   81.4 kg   Height:        Intake/Output Summary (Last 24 hours) at 07/22/2020 1620 Last data filed at 07/22/2020 9150 Gross per 24 hour  Intake 370 ml  Output 700 ml  Net -330 ml   Filed Weights   07/19/20 0519 07/21/20 0500 07/22/20 0500  Weight: 84.4 kg 79 kg 81.4 kg    Examination:  General exam: NAD, Chronic ill appearing Respiratory system: BL Crackles Cardiovascular system: S 1, S 2 RRR Gastrointestinal system: BS present, soft, nt GU:  Gravid uterus  Central nervous system: Alert Extremities: trace edema    Data Reviewed: I have personally reviewed following labs and imaging studies  CBC: Recent Labs  Lab 07/17/20 0622 07/18/20 0500 07/20/20 0500 07/21/20 1004 07/22/20 0505  WBC 11.8* 12.5* 17.3* 17.5* 16.6*  HGB 7.5* 8.4* 9.4* 10.9* 10.7*  HCT 23.6* 26.5* 29.2* 33.8* 33.0*  MCV 94.4 94.3 91.5 91.1 90.9  PLT 299 317 426* 483* 569*   Basic Metabolic Panel: Recent Labs  Lab 07/18/20 0500 07/19/20 1112 07/20/20 0500 07/21/20 1004 07/22/20 0505 07/22/20 0939  NA 131* 130* 129* 130* 131*  --   K 3.8 4.7 4.5 4.7 4.5  --   CL 93* 93* 90* 92* 92*  --   CO2 27 28 27 28 30   --   GLUCOSE 95 80 85 90 86  --   BUN 8 8 11 18 20   --   CREATININE 0.52 0.51 0.46 0.53 0.59  --   CALCIUM 8.6* 9.2 9.2 9.8 9.6  --   MG  --   --  1.8  --   --  1.9    GFR: Estimated Creatinine Clearance: 108.8 mL/min (by C-G formula based on SCr of 0.59 mg/dL). Liver Function Tests: Recent Labs  Lab 07/16/20 0333 07/17/20 0622  AST 11* 11*  ALT 6 <5  ALKPHOS 130* 163*  BILITOT 0.6 0.7  PROT 6.0* 6.1*  ALBUMIN 1.7* 1.7*   No results for input(s): LIPASE, AMYLASE in the last 168 hours. No results for input(s): AMMONIA in the last 168 hours. Coagulation Profile: No results for input(s): INR, PROTIME in the last 168 hours. Cardiac Enzymes: No results for input(s): CKTOTAL, CKMB, CKMBINDEX, TROPONINI in the last 168 hours. BNP (last 3 results) No results for input(s): PROBNP in the last 8760 hours. HbA1C: No results for input(s): HGBA1C in the last 72 hours. CBG: Recent Labs  Lab 07/21/20 1210  GLUCAP 121*   Lipid Profile: No results  for input(s): CHOL, HDL, LDLCALC, TRIG, CHOLHDL, LDLDIRECT in the last 72 hours. Thyroid Function Tests: No results for input(s): TSH, T4TOTAL, FREET4, T3FREE, THYROIDAB in the last 72 hours. Anemia Panel: No results for input(s): VITAMINB12, FOLATE, FERRITIN, TIBC, IRON, RETICCTPCT in the last 72 hours. Sepsis Labs: No results for input(s): PROCALCITON, LATICACIDVEN in the last 168 hours.  Recent Results (from the past 240 hour(s))  Resp Panel by RT-PCR (Flu A&B, Covid) Nasopharyngeal Swab     Status: None   Collection Time: 07/13/20 11:21 AM   Specimen: Nasopharyngeal Swab; Nasopharyngeal(NP) swabs in vial transport medium  Result Value Ref Range Status   SARS Coronavirus 2 by RT PCR NEGATIVE NEGATIVE Final    Comment: (NOTE) SARS-CoV-2 target nucleic acids are NOT DETECTED.  The SARS-CoV-2 RNA is generally detectable in upper respiratory specimens during the acute phase of infection. The lowest concentration of SARS-CoV-2 viral copies this assay can detect is 138 copies/mL. A negative result does not preclude SARS-Cov-2 infection and should not be used as the sole basis for treatment or other  patient management decisions. A negative result may occur with  improper specimen collection/handling, submission of specimen other than nasopharyngeal swab, presence of viral mutation(s) within the areas targeted by this assay, and inadequate number of viral copies(<138 copies/mL). A negative result must be combined with clinical observations, patient history, and epidemiological information. The expected result is Negative.  Fact Sheet for Patients:  EntrepreneurPulse.com.au  Fact Sheet for Healthcare Providers:  IncredibleEmployment.be  This test is no t yet approved or cleared by the [place] FDA and  has been authorized for detection and/or diagnosis of SARS-CoV-2 by FDA under an Emergency Use Authorization (EUA). This EUA will remain  in effect (meaning this test can be used) for the duration of the COVID-19 declaration under Section 564(b)(1) of the Act, 21 U.S.C.section 360bbb-3(b)(1), unless the authorization is terminated  or revoked sooner.       Influenza A by PCR NEGATIVE NEGATIVE Final   Influenza B by PCR NEGATIVE NEGATIVE Final    Comment: (NOTE) The Xpert Xpress SARS-CoV-2/FLU/RSV plus assay is intended as an aid in the diagnosis of influenza from Nasopharyngeal swab specimens and should not be used as a sole basis for treatment. Nasal washings and aspirates are unacceptable for Xpert Xpress SARS-CoV-2/FLU/RSV testing.  Fact Sheet for Patients: EntrepreneurPulse.com.au  Fact Sheet for Healthcare Providers: IncredibleEmployment.be  This test is not yet approved or cleared by the [place] FDA and has been authorized for detection and/or diagnosis of SARS-CoV-2 by FDA under an Emergency Use Authorization (EUA). This EUA will remain in effect (meaning this test can be used) for the duration of the COVID-19 declaration under Section 564(b)(1) of the Act, 21 U.S.C. section  360bbb-3(b)(1), unless the authorization is terminated or revoked.  Performed at [facility], [place] [address] [place], [place] [address]   Culture, blood (routine x 2)     Status: Abnormal   Collection Time: 07/13/20 12:20 PM   Specimen: BLOOD  Result Value Ref Range Status   Specimen Description BLOOD SITE NOT SPECIFIED  Final   Special Requests   Final    BOTTLES DRAWN AEROBIC AND ANAEROBIC Blood Culture adequate volume   Culture  Setup Time   Final    GRAM POSITIVE COCCI IN CHAINS IN BOTH AEROBIC AND ANAEROBIC BOTTLES Organism ID to follow CRITICAL RESULT CALLED TO, READ BACK BY AND VERIFIED WITH: Performed at [facility], [place],  [place] [address]    Culture METHICILLIN RESISTANT STAPHYLOCOCCUS AUREUS (A)  Final   Report Status 07/16/2020 FINAL  Final   Organism ID, Bacteria METHICILLIN RESISTANT STAPHYLOCOCCUS AUREUS  Final      Susceptibility   Methicillin resistant staphylococcus aureus - MIC*    CIPROFLOXACIN <=0.5 SENSITIVE Sensitive     ERYTHROMYCIN >=8 RESISTANT Resistant     GENTAMICIN <=0.5 SENSITIVE Sensitive     OXACILLIN >=4 RESISTANT Resistant     TETRACYCLINE <=1 SENSITIVE Sensitive     VANCOMYCIN <=0.5 SENSITIVE Sensitive     TRIMETH/SULFA <=10 SENSITIVE Sensitive     CLINDAMYCIN <=0.25 SENSITIVE Sensitive     RIFAMPIN <=0.5 SENSITIVE Sensitive     Inducible Clindamycin NEGATIVE Sensitive     * METHICILLIN RESISTANT STAPHYLOCOCCUS AUREUS  Blood Culture ID Panel (Reflexed)     Status: Abnormal   Collection Time: 07/13/20 12:20 PM  Result Value Ref Range Status   Enterococcus faecalis NOT DETECTED NOT DETECTED Final   Enterococcus Faecium NOT DETECTED NOT DETECTED Final   Listeria monocytogenes NOT DETECTED NOT DETECTED Final   Staphylococcus species DETECTED (A) NOT DETECTED Final    Comment: CRITICAL RESULT CALLED TO, READ BACK BY AND VERIFIED WITH: PHARMD AMEND CAREN BY MESSAN H. AT 0120 ON 07/14/2020    Staphylococcus  aureus (BCID) DETECTED (A) NOT DETECTED Final    Comment: Methicillin (oxacillin)-resistant Staphylococcus aureus (MRSA). MRSA is predictably resistant to beta-lactam antibiotics (except ceftaroline). Preferred therapy is vancomycin unless clinically contraindicated. Patient requires contact precautions if  hospitalized. CRITICAL RESULT CALLED TO, READ BACK BY AND VERIFIED WITH: PHARMD AMEND CAREN BY MESSAN H. AT 0120 ON 07/14/2020    Staphylococcus epidermidis NOT DETECTED NOT DETECTED Final   Staphylococcus lugdunensis NOT DETECTED NOT DETECTED Final   Streptococcus species NOT DETECTED NOT DETECTED Final   Streptococcus agalactiae NOT DETECTED NOT DETECTED Final   Streptococcus pneumoniae NOT DETECTED NOT DETECTED Final   Streptococcus pyogenes NOT DETECTED NOT DETECTED Final   A.calcoaceticus-baumannii NOT DETECTED NOT DETECTED Final   Bacteroides fragilis NOT DETECTED NOT DETECTED Final   Enterobacterales NOT DETECTED NOT DETECTED Final   Enterobacter cloacae complex NOT DETECTED NOT DETECTED Final   Escherichia coli NOT DETECTED NOT DETECTED Final   Klebsiella aerogenes NOT DETECTED NOT DETECTED Final   Klebsiella oxytoca NOT DETECTED NOT DETECTED Final   Klebsiella pneumoniae NOT DETECTED NOT DETECTED Final   Proteus species NOT DETECTED NOT DETECTED Final   Salmonella species NOT DETECTED NOT DETECTED Final   Serratia marcescens NOT DETECTED NOT DETECTED Final   Haemophilus influenzae NOT DETECTED NOT DETECTED Final   Neisseria meningitidis NOT DETECTED NOT DETECTED Final   Pseudomonas aeruginosa NOT DETECTED NOT DETECTED Final   Stenotrophomonas maltophilia NOT DETECTED NOT DETECTED Final   Candida albicans NOT DETECTED NOT DETECTED Final   Candida auris NOT DETECTED NOT DETECTED Final   Candida glabrata NOT DETECTED NOT DETECTED Final   Candida krusei NOT DETECTED NOT DETECTED Final   Candida parapsilosis NOT DETECTED NOT DETECTED Final   Candida tropicalis NOT DETECTED NOT  DETECTED Final   Cryptococcus neoformans/gattii NOT DETECTED NOT DETECTED Final   Meth resistant mecA/C and MREJ DETECTED (A) NOT DETECTED Final    Comment: CRITICAL RESULT CALLED TO, READ BACK BY AND VERIFIED WITH: PHARMD AMEND CAREN BY MESSAN H. AT 0120 ON 07/14/2020 Performed at [facility], [address] [place], [place] [address]   Culture, blood (routine x 2)     Status: Abnormal  Collection Time: 07/13/20 12:21 PM   Specimen: BLOOD  Result Value Ref Range Status   Specimen Description BLOOD SITE NOT SPECIFIED  Final   Special Requests   Final    BOTTLES DRAWN AEROBIC AND ANAEROBIC Blood Culture adequate volume   Culture  Setup Time   Final    GRAM POSITIVE COCCI IN BOTH AEROBIC AND ANAEROBIC BOTTLES CRITICAL VALUE NOTED.  VALUE IS CONSISTENT WITH PREVIOUSLY REPORTED AND CALLED VALUE.    Culture (A)  Final    STAPHYLOCOCCUS AUREUS SUSCEPTIBILITIES PERFORMED ON PREVIOUS CULTURE WITHIN THE LAST 5 DAYS. Performed at [facility], [place] [address] [place], [place] [address]    Report Status 07/16/2020 FINAL  Final  MRSA PCR Screening     Status: Abnormal   Collection Time: 07/13/20  8:37 PM   Specimen: Nasal Mucosa; Nasopharyngeal  Result Value Ref Range Status   MRSA by PCR POSITIVE (A) NEGATIVE Final    Comment:        The GeneXpert MRSA Assay (FDA approved for NASAL specimens only), is one component of a comprehensive MRSA colonization surveillance program. It is not intended to diagnose MRSA infection nor to guide or monitor treatment for MRSA infections. RESULT CALLED TO, READ BACK BY AND VERIFIED WITH: TOLER,M RN 07/13/2020 AT 2319 SKEEN,P Performed at [facility], [place] [address], [place], [place] [address]   Urine culture     Status: None   Collection Time: 07/13/20 10:10 PM   Specimen: Urine, Clean Catch  Result Value Ref Range Status   Specimen Description URINE, CLEAN CATCH  Final   Special Requests NONE  Final   Culture   Final    NO  GROWTH Performed at [facility], [address], [place], [place] [address]    Report Status 07/15/2020 FINAL  Final  Culture, blood (routine x 2)     Status: None   Collection Time: 07/15/20  6:49 PM   Specimen: BLOOD  Result Value Ref Range Status   Specimen Description BLOOD RIGHT ANTECUBITAL  Final   Special Requests   Final    BOTTLES DRAWN AEROBIC AND ANAEROBIC BACTEROIDES CACCAE   Culture   Final    NO GROWTH 5 DAYS Performed at [facility], [place] [address] [place], [place] [address]    Report Status 07/20/2020 FINAL  Final  Culture, blood (routine x 2)     Status: None   Collection Time: 07/15/20  6:50 PM   Specimen: BLOOD  Result Value Ref Range Status   Specimen Description BLOOD BLOOD RIGHT FOREARM  Final   Special Requests   Final    BOTTLES DRAWN AEROBIC AND ANAEROBIC Blood Culture adequate volume   Culture   Final    NO GROWTH 5 DAYS Performed at [facility], [place] [address] [place], [place] [address]    Report Status 07/20/2020 FINAL  Final         Radiology Studies: DG Orthopantogram  Result Date: 07/22/2020 CLINICAL DATA:  Dental pain EXAM: ORTHOPANTOGRAM/PANORAMIC COMPARISON:  06/25/2018 FINDINGS: Negative for fracture. Interval extraction of right lower molars. Interval healing of periapical lucency. Multiple caries are present including the upper canine on the right, and left lower premolar and molar teeth. No periapical lucency identified. IMPRESSION: Multiple caries. Electronically Signed   By: Franchot Gallo M.D.   On: 07/22/2020 16:16        Scheduled Meds: . sodium chloride   Intravenous Once  . acetaminophen  650 mg Oral Q6H  . amoxicillin-clavulanate  1 tablet Oral Q12H  . bisacodyl  10 mg Rectal Once  . Chlorhexidine Gluconate Cloth  6 each Topical Daily  . enoxaparin (LOVENOX) injection  60 mg Subcutaneous Q24H  . [START ON 07/23/2020] furosemide  40 mg Oral Daily  . methadone  30 mg Oral TID PC  . polyethylene  glycol  17 g Oral BID  . prenatal vitamin w/FE, FA  1 tablet Oral Q1200  . senna-docusate  1 tablet Oral BID   Continuous Infusions: . vancomycin 750 mg (07/22/20 0732)     LOS: 9 days    Time spent: 35 minutes    Teddi Badalamenti A Abriella Filkins, MD [facility]   If 7PM-7AM, please contact night-coverage [email or web address]  07/22/2020, 4:20 PM

## 2020-07-22 NOTE — Progress Notes (Signed)
Pharmacy Antibiotic Note  Kelly [name] is a 35 y.o. female with MRSA bacteremia  And TV endocarditis. 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  Pharmacy has been consulted for vancomycin. Patient is pregnant with EGA of [redacted] weeks.  3/11: Vancomycin peak AM = 29, trough = 13.  Caculated AUC = 508 (at goal).     Plan: Continue vancomycin to 750 mg IV q 8 hrs Monitor C&S, clinical status, renal function, VT as needed  Temp (24hrs), Avg:97.9 F (36.6 C), Min:97.9 F (36.6 C), Max:97.9 F (36.6 C)  Recent Labs  Lab 07/17/20 0622 07/17/20 1619 07/18/20 0030 07/18/20 0500 07/18/20 0530 07/19/20 1112 07/20/20 0500 07/21/20 1004 07/22/20 0505 07/22/20 0930 07/22/20 1500  WBC 11.8*  --   --  12.5*  --   --  17.3* 17.5* 16.6*  --   --   CREATININE 0.47  --   --  0.52  --  0.51 0.46 0.53 0.59  --   --   VANCOTROUGH  --   --   --   --  14*  --   --   --   --   --  13*  VANCOPEAK  --    < > 46*  --   --   --   --   --   --  29*  --    < > = values in this interval not displayed.    Estimated Creatinine Clearance: 108.8 mL/min (by C-G formula based on SCr of 0.59 mg/dL).    Allergies  Allergen Reactions  . Hydrocodone Itching  . Morphine And Related Nausea And Vomiting    Antimicrobials this admission: Azith 3/2 x1 CTX 3/2 x1 Vancomycin 3/2> Cefepime 3/2> 3/3  Cultures: 3/2 urine - ng 3/2 Bld - GPC - MRSA 3/2 MRSA PCR pos 3/4 BCx x 2 > Neg  Thank you for allowing pharmacy to be a part of this patient's care.  Sloan Leiter, PharmD, BCPS, BCCCP Clinical Pharmacist Please refer to [facility] for [place] numbers  07/22/2020 3:51 PM   [facility] pharmacy phone numbers are listed on [email or web address]

## 2020-07-22 NOTE — Progress Notes (Signed)
Subjective: He is complaining of severe dental pain in the left side extending up into her face.     Antibiotics:  Anti-infectives (From admission, onward)   Start     Dose/Rate Route Frequency Ordered Stop   07/22/20 1045  amoxicillin-clavulanate (AUGMENTIN) 875-125 MG per tablet 1 tablet        1 tablet Oral Every 12 hours 07/22/20 0952     07/18/20 0830  vancomycin (VANCOCIN) IVPB 750 mg/150 ml premix        750 mg 150 mL/hr over 60 Minutes Intravenous Every 8 hours 07/18/20 0750     07/16/20 0400  Vancomycin (VANCOCIN) 1,250 mg in sodium chloride 0.9 % 250 mL IVPB  Status:  Discontinued        1,250 mg 166.7 mL/hr over 90 Minutes Intravenous Every 8 hours 07/15/20 1944 07/18/20 0749   07/15/20 1715  penicillin g benzathine (BICILLIN LA) 1200000 UNIT/2ML injection 2.4 Million Units        2.4 Million Units Intramuscular  Once 07/15/20 1621 07/15/20 1747   07/14/20 0300  vancomycin (VANCOCIN) IVPB 1000 mg/200 mL premix        1,000 mg 200 mL/hr over 60 Minutes Intravenous Every 8 hours 07/13/20 1806 07/15/20 2059   07/13/20 1730  Vancomycin (VANCOCIN) 1,500 mg in sodium chloride 0.9 % 500 mL IVPB        1,500 mg 250 mL/hr over 120 Minutes Intravenous  Once 07/13/20 1709 07/13/20 2116   07/13/20 1730  ceFEPIme (MAXIPIME) 2 g in sodium chloride 0.9 % 100 mL IVPB  Status:  Discontinued        2 g 200 mL/hr over 30 Minutes Intravenous Every 8 hours 07/13/20 1709 07/14/20 1050   07/13/20 1315  cefTRIAXone (ROCEPHIN) 1 g in sodium chloride 0.9 % 100 mL IVPB        1 g 200 mL/hr over 30 Minutes Intravenous  Once 07/13/20 1306 07/13/20 1538   07/13/20 1315  azithromycin (ZITHROMAX) 500 mg in sodium chloride 0.9 % 250 mL IVPB        500 mg 250 mL/hr over 60 Minutes Intravenous  Once 07/13/20 1306 07/13/20 1644      Medications: Scheduled Meds: . sodium chloride   Intravenous Once  . acetaminophen  650 mg Oral Q6H  . amoxicillin-clavulanate  1 tablet Oral Q12H  .  Chlorhexidine Gluconate Cloth  6 each Topical Daily  . enoxaparin (LOVENOX) injection  60 mg Subcutaneous Q24H  . [START ON 07/23/2020] furosemide  40 mg Oral Daily  . methadone  30 mg Oral TID PC  . polyethylene glycol  17 g Oral BID  . prenatal vitamin w/FE, FA  1 tablet Oral Q1200  . senna-docusate  1 tablet Oral BID   Continuous Infusions: . vancomycin 750 mg (07/22/20 0732)   PRN Meds:.HYDROmorphone (DILAUDID) injection, prochlorperazine, sodium chloride flush    Objective: Weight change: 2.383 kg  Intake/Output Summary (Last 24 hours) at 07/22/2020 1225 Last data filed at 07/22/2020 2836 Gross per 24 hour  Intake 370 ml  Output 700 ml  Net -330 ml   Blood pressure (!) 119/92, pulse 87, temperature 97.9 F (36.6 C), temperature source Oral, resp. rate 18, height 5' 7.5" (1.715 m), weight 81.4 kg, SpO2 97 %, not currently breastfeeding. Temp:  [97.9 F (36.6 C)] 97.9 F (36.6 C) (03/11 0832) Pulse Rate:  [77-91] 87 (03/11 0832) Resp:  [16-18] 18 (03/11 0832) BP: (109-125)/(64-92) 119/92 (03/11 0832) SpO2:  [  93 %-97 %] 97 % (03/11 0459) Weight:  [81.4 kg] 81.4 kg (03/11 0500)  Physical Exam: Physical Exam Constitutional:      General: She is not in acute distress.    Appearance: She is well-developed. She is not diaphoretic.  HENT:     Head: Normocephalic and atraumatic.     Right Ear: External ear normal.     Left Ear: External ear normal.     Mouth/Throat:     Dentition: Abnormal dentition. Gingival swelling present.     Pharynx: No oropharyngeal exudate.  Eyes:     General: No scleral icterus.    Conjunctiva/sclera: Conjunctivae normal.     Pupils: Pupils are equal, round, and reactive to light.  Cardiovascular:     Rate and Rhythm: Regular rhythm. Tachycardia present.     Heart sounds: No murmur heard.   Pulmonary:     Effort: Pulmonary effort is normal. No respiratory distress.     Breath sounds: No wheezing.  Abdominal:     General: There is no  distension.     Palpations: Abdomen is soft.  Musculoskeletal:        General: No tenderness. Normal range of motion.  Lymphadenopathy:     Cervical: No cervical adenopathy.  Skin:    General: Skin is warm and dry.     Coloration: Skin is not pale.     Findings: No erythema or rash.  Neurological:     Mental Status: She is alert and oriented to person, place, and time.     Motor: No abnormal muscle tone.     Coordination: Coordination normal.  Psychiatric:        Attention and Perception: Attention normal.        Mood and Affect: Mood is anxious.        Speech: Speech normal.        Behavior: Behavior normal.        Thought Content: Thought content normal.        Cognition and Memory: Cognition normal.        Judgment: Judgment normal.      CBC:    BMET Recent Labs    07/21/20 1004 07/22/20 0505  NA 130* 131*  K 4.7 4.5  CL 92* 92*  CO2 28 30  GLUCOSE 90 86  BUN 18 20  CREATININE 0.53 0.59  CALCIUM 9.8 9.6     Liver Panel  No results for input(s): PROT, ALBUMIN, AST, ALT, ALKPHOS, BILITOT, BILIDIR, IBILI in the last 72 hours.     Sedimentation Rate No results for input(s): ESRSEDRATE in the last 72 hours. C-Reactive Protein No results for input(s): CRP in the last 72 hours.  Micro Results: Recent Results (from the past 720 hour(s))  Resp Panel by RT-PCR (Flu A&B, Covid) Nasopharyngeal Swab     Status: None   Collection Time: 07/13/20 11:21 AM   Specimen: Nasopharyngeal Swab; Nasopharyngeal(NP) swabs in vial transport medium  Result Value Ref Range Status   SARS Coronavirus 2 by RT PCR NEGATIVE NEGATIVE Final    Comment: (NOTE) SARS-CoV-2 target nucleic acids are NOT DETECTED.  The SARS-CoV-2 RNA is generally detectable in upper respiratory specimens during the acute phase of infection. The lowest concentration of SARS-CoV-2 viral copies this assay can detect is 138 copies/mL. A negative result does not preclude SARS-Cov-2 infection and should not  be used as the sole basis for treatment or other patient management decisions. A negative result may occur with  improper specimen  collection/handling, submission of specimen other than nasopharyngeal swab, presence of viral mutation(s) within the areas targeted by this assay, and inadequate number of viral copies(<138 copies/mL). A negative result must be combined with clinical observations, patient history, and epidemiological information. The expected result is Negative.  Fact Sheet for Patients:  EntrepreneurPulse.com.au  Fact Sheet for Healthcare Providers:  IncredibleEmployment.be  This test is no t yet approved or cleared by the [place] FDA and  has been authorized for detection and/or diagnosis of SARS-CoV-2 by FDA under an Emergency Use Authorization (EUA). This EUA will remain  in effect (meaning this test can be used) for the duration of the COVID-19 declaration under Section 564(b)(1) of the Act, 21 U.S.C.section 360bbb-3(b)(1), unless the authorization is terminated  or revoked sooner.       Influenza A by PCR NEGATIVE NEGATIVE Final   Influenza B by PCR NEGATIVE NEGATIVE Final    Comment: (NOTE) The Xpert Xpress SARS-CoV-2/FLU/RSV plus assay is intended as an aid in the diagnosis of influenza from Nasopharyngeal swab specimens and should not be used as a sole basis for treatment. Nasal washings and aspirates are unacceptable for Xpert Xpress SARS-CoV-2/FLU/RSV testing.  Fact Sheet for Patients: EntrepreneurPulse.com.au  Fact Sheet for Healthcare Providers: IncredibleEmployment.be  This test is not yet approved or cleared by the [place] FDA and has been authorized for detection and/or diagnosis of SARS-CoV-2 by FDA under an Emergency Use Authorization (EUA). This EUA will remain in effect (meaning this test can be used) for the duration of the COVID-19 declaration under Section  564(b)(1) of the Act, 21 U.S.C. section 360bbb-3(b)(1), unless the authorization is terminated or revoked.  Performed at [facility], [place] [address], [place], [place] [address]   Culture, blood (routine x 2)     Status: Abnormal   Collection Time: 07/13/20 12:20 PM   Specimen: BLOOD  Result Value Ref Range Status   Specimen Description BLOOD SITE NOT SPECIFIED  Final   Special Requests   Final    BOTTLES DRAWN AEROBIC AND ANAEROBIC Blood Culture adequate volume   Culture  Setup Time   Final    GRAM POSITIVE COCCI IN CHAINS IN BOTH AEROBIC AND ANAEROBIC BOTTLES Organism ID to follow CRITICAL RESULT CALLED TO, READ BACK BY AND VERIFIED WITH: Performed at [facility], [place] [address] [place], [place] [address]    Culture METHICILLIN RESISTANT STAPHYLOCOCCUS AUREUS (A)  Final   Report Status 07/16/2020 FINAL  Final   Organism ID, Bacteria METHICILLIN RESISTANT STAPHYLOCOCCUS AUREUS  Final      Susceptibility   Methicillin resistant staphylococcus aureus - MIC*    CIPROFLOXACIN <=0.5 SENSITIVE Sensitive     ERYTHROMYCIN >=8 RESISTANT Resistant     GENTAMICIN <=0.5 SENSITIVE Sensitive     OXACILLIN >=4 RESISTANT Resistant     TETRACYCLINE <=1 SENSITIVE Sensitive     VANCOMYCIN <=0.5 SENSITIVE Sensitive     TRIMETH/SULFA <=10 SENSITIVE Sensitive     CLINDAMYCIN <=0.25 SENSITIVE Sensitive     RIFAMPIN <=0.5 SENSITIVE Sensitive     Inducible Clindamycin NEGATIVE Sensitive     * METHICILLIN RESISTANT STAPHYLOCOCCUS AUREUS  Blood Culture ID Panel (Reflexed)     Status: Abnormal   Collection Time: 07/13/20 12:20 PM  Result Value Ref Range Status   Enterococcus faecalis NOT DETECTED NOT DETECTED Final   Enterococcus Faecium NOT DETECTED NOT DETECTED Final   Listeria monocytogenes NOT DETECTED NOT DETECTED Final   Staphylococcus species DETECTED (A) NOT DETECTED Final  Comment: CRITICAL RESULT CALLED TO, READ BACK BY AND VERIFIED WITH: PHARMD AMEND CAREN BY MESSAN H. AT  0120 ON 07/14/2020    Staphylococcus aureus (BCID) DETECTED (A) NOT DETECTED Final    Comment: Methicillin (oxacillin)-resistant Staphylococcus aureus (MRSA). MRSA is predictably resistant to beta-lactam antibiotics (except ceftaroline). Preferred therapy is vancomycin unless clinically contraindicated. Patient requires contact precautions if  hospitalized. CRITICAL RESULT CALLED TO, READ BACK BY AND VERIFIED WITH: PHARMD AMEND CAREN BY MESSAN H. AT 0120 ON 07/14/2020    Staphylococcus epidermidis NOT DETECTED NOT DETECTED Final   Staphylococcus lugdunensis NOT DETECTED NOT DETECTED Final   Streptococcus species NOT DETECTED NOT DETECTED Final   Streptococcus agalactiae NOT DETECTED NOT DETECTED Final   Streptococcus pneumoniae NOT DETECTED NOT DETECTED Final   Streptococcus pyogenes NOT DETECTED NOT DETECTED Final   A.calcoaceticus-baumannii NOT DETECTED NOT DETECTED Final   Bacteroides fragilis NOT DETECTED NOT DETECTED Final   Enterobacterales NOT DETECTED NOT DETECTED Final   Enterobacter cloacae complex NOT DETECTED NOT DETECTED Final   Escherichia coli NOT DETECTED NOT DETECTED Final   Klebsiella aerogenes NOT DETECTED NOT DETECTED Final   Klebsiella oxytoca NOT DETECTED NOT DETECTED Final   Klebsiella pneumoniae NOT DETECTED NOT DETECTED Final   Proteus species NOT DETECTED NOT DETECTED Final   Salmonella species NOT DETECTED NOT DETECTED Final   Serratia marcescens NOT DETECTED NOT DETECTED Final   Haemophilus influenzae NOT DETECTED NOT DETECTED Final   Neisseria meningitidis NOT DETECTED NOT DETECTED Final   Pseudomonas aeruginosa NOT DETECTED NOT DETECTED Final   Stenotrophomonas maltophilia NOT DETECTED NOT DETECTED Final   Candida albicans NOT DETECTED NOT DETECTED Final   Candida auris NOT DETECTED NOT DETECTED Final   Candida glabrata NOT DETECTED NOT DETECTED Final   Candida krusei NOT DETECTED NOT DETECTED Final   Candida parapsilosis NOT DETECTED NOT DETECTED Final    Candida tropicalis NOT DETECTED NOT DETECTED Final   Cryptococcus neoformans/gattii NOT DETECTED NOT DETECTED Final   Meth resistant mecA/C and MREJ DETECTED (A) NOT DETECTED Final    Comment: CRITICAL RESULT CALLED TO, READ BACK BY AND VERIFIED WITH: PHARMD AMEND CAREN BY MESSAN H. AT 0120 ON 07/14/2020 Performed at [name], [address] [place], [place] [address]   Culture, blood (routine x 2)     Status: Abnormal   Collection Time: 07/13/20 12:21 PM   Specimen: BLOOD  Result Value Ref Range Status   Specimen Description BLOOD SITE NOT SPECIFIED  Final   Special Requests   Final    BOTTLES DRAWN AEROBIC AND ANAEROBIC Blood Culture adequate volume   Culture  Setup Time   Final    GRAM POSITIVE COCCI IN BOTH AEROBIC AND ANAEROBIC BOTTLES CRITICAL VALUE NOTED.  VALUE IS CONSISTENT WITH PREVIOUSLY REPORTED AND CALLED VALUE.    Culture (A)  Final    STAPHYLOCOCCUS AUREUS SUSCEPTIBILITIES PERFORMED ON PREVIOUS CULTURE WITHIN THE LAST 5 DAYS. Performed at [facility], [place] [address], [place], [place] [address]    Report Status 07/16/2020 FINAL  Final  MRSA PCR Screening     Status: Abnormal   Collection Time: 07/13/20  8:37 PM   Specimen: Nasal Mucosa; Nasopharyngeal  Result Value Ref Range Status   MRSA by PCR POSITIVE (A) NEGATIVE Final    Comment:        The GeneXpert MRSA Assay (FDA approved for NASAL specimens only), is one component of a comprehensive MRSA colonization surveillance program. It is not intended to diagnose  MRSA infection nor to guide or monitor treatment for MRSA infections. RESULT CALLED TO, READ BACK BY AND VERIFIED WITH: TOLER,M RN 07/13/2020 AT 2319 SKEEN,P Performed at [facility], [place] [address], [place], [place] [address]   Urine culture     Status: None   Collection Time: 07/13/20 10:10 PM   Specimen: Urine, Clean Catch  Result Value Ref Range Status   Specimen Description URINE, CLEAN CATCH  Final   Special Requests  NONE  Final   Culture   Final    NO GROWTH Performed at [facility], [address], [place], [place] [address]    Report Status 07/15/2020 FINAL  Final  Culture, blood (routine x 2)     Status: None   Collection Time: 07/15/20  6:49 PM   Specimen: BLOOD  Result Value Ref Range Status   Specimen Description BLOOD RIGHT ANTECUBITAL  Final   Special Requests   Final    BOTTLES DRAWN AEROBIC AND ANAEROBIC BACTEROIDES CACCAE   Culture   Final    NO GROWTH 5 DAYS Performed at [facility], [place] [address], [place], [place] [address]    Report Status 07/20/2020 FINAL  Final  Culture, blood (routine x 2)     Status: None   Collection Time: 07/15/20  6:50 PM   Specimen: BLOOD  Result Value Ref Range Status   Specimen Description BLOOD BLOOD RIGHT FOREARM  Final   Special Requests   Final    BOTTLES DRAWN AEROBIC AND ANAEROBIC Blood Culture adequate volume   Culture   Final    NO GROWTH 5 DAYS Performed at [facility], [place] [address], [place], [place] [address]    Report Status 07/20/2020 FINAL  Final    Studies/Results: No results found.    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 female with admission yet again with right-sided endocarditis with MRSA and septic emboli to the lungs we suspect.  While she does not have overt vegetations on transthoracic echocardiogram I am confident that she has had them and they have embolized into her lungs.  She also has low back pain which could be related to septic embolization to her pregnancy or could be due to pathology in her lumbar spine.  Fortunately her blood cultures have cleared  We will continue vancomycin  She did not tolerate MRI of the spine  Dental infection we will add Augmentin would get panorex and  recommend consultation with dentistry or oral surgery.  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 West Bali is available this weekend for questions and I will be back on Monday.   LOS: 9 days   Alcide Evener 07/22/2020, 12:25 PM

## 2020-07-22 NOTE — Progress Notes (Signed)
[facility] Antenatal Progress Note  Kelly [name] is a 35 y.o. [phone] currently 47w3dadmitted for MRSA bacteremia, infective endocarditis, severe tricuspid regurgitation, septic emboli with h/o discitis/osteomyelitis/epidural abscess, syphilis, chronic HTN, and acute on chronic right side heart failure in the setting of longstanding h/o IV heroin and fentanyl abuse.  Fetal presentation: Breech as per 07/20/2020 ultrasound  Length of Stay:  9  Days  Date of admission:07/13/2020  Subjective:   Complains of tooth pain and jaw pain, already ordered for orthopantogram by ID, was also started on Augmentin for infection.  Very sleepy today, hard to have conversation today. Patient reports the fetal movement as active. Patient reports uterine contraction  activity as none. Patient reports  vaginal bleeding as none. Patient describes fluid per vagina as none.  Vitals:  Blood pressure (!) 119/92, pulse 87, temperature 97.9 F (36.6 C), temperature source Oral, resp. rate 18, height 5' 7.5" (1.715 m), weight 81.4 kg, SpO2 97 %, not currently breastfeeding. Vitals:   07/22/20 0016 07/22/20 0459 07/22/20 0500 07/22/20 0832  BP: 109/64 125/87  (!) 119/92  Pulse: 91 77  87  Resp:  18  18  Temp:  97.9 F (36.6 C)  97.9 F (36.6 C)  TempSrc:  Oral  Oral  SpO2: 94% 97%    Weight:   81.4 kg   Height:       Physical Examination: General appearance - lying in bed Mental status - cooperative, talking Abdomen - gravid, soft and non-tender Extremities - edema much improved FHR Tracing (reviewed by me): Baseline 150s bpm, moderate variability, + small 10 x 10 accelerations, no decelerations.  Category 1. Performed by OB Rapid Response RN   Labs: CBC Latest Ref Rng & Units 07/22/2020 07/21/2020 07/20/2020  WBC 4.0 - 10.5 K/uL 16.6(H) 17.5(H) 17.3(H)  Hemoglobin 12.0 - 15.0 g/dL 10.7(L) 10.9(L) 9.4(L)  Hematocrit 36.0 - 46.0 % 33.0(L) 33.8(L) 29.2(L)  Platelets 150 - 400 K/uL 466(H)  483(H) 426(H)   CMP Latest Ref Rng & Units 07/22/2020 07/21/2020 07/20/2020  Glucose 70 - 99 mg/dL 86 90 85  BUN 6 - 20 mg/dL 20 18 11   Creatinine 0.44 - 1.00 mg/dL 0.59 0.53 0.46  Sodium 135 - 145 mmol/L 131(L) 130(L) 129(L)  Potassium 3.5 - 5.1 mmol/L 4.5 4.7 4.5  Chloride 98 - 111 mmol/L 92(L) 92(L) 90(L)  CO2 22 - 32 mmol/L 30 28 27   Calcium 8.9 - 10.3 mg/dL 9.6 9.8 9.2  Total Protein 6.5 - 8.1 g/dL - - -  Total Bilirubin 0.3 - 1.2 mg/dL - - -  Alkaline Phos 38 - 126 U/L - - -  AST 15 - 41 U/L - - -  ALT 0 - 44 U/L - - -   07/20/2020 MFM Ultrasound: Breech presentation.  No abnormalities on discussion with Dr. FAnnamaria Boots report pending.   A/P: [id number]@ [id number] for MRSA bacteremia, endocarditis and cardiac failure with history of polysubstance abuse. Also multiple other heath issues as seen below.   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)   Tricuspid regurgitation   Preexisting hypertension complicating pregnancy, antepartum   Dental caries   Endocarditis of tricuspid valve   History of VBAC  Lumbar back pain   Acute on chronic congestive heart failure (HCC)   Cellulitis of right lower extremity   Septic embolism (HCC)   Syphilis   [redacted] weeks gestation of pregnancy   Flank pain   Drug use affecting pregnancy in second trimester   Maternal endocarditis affecting pregnancy in second trimester, antepartum   Methadone maintenance treatment affecting pregnancy ([place])  -Management per IM, ID and Cardiology teams as current hospitalization is not related to pregnancy appreciate the multidisciplinary care for this complex patient. -Will defer decisions about appropriate analgesia to her primary  team.   -Continue antibiotic therapies as per ID. -Consider Dental consult as needed, pending orthopantogram results.  -From an OB standpoint, fetal well being reassuring. Reassuring FHR strip and ultrasound done today.  There are no signs of any acute OB concerns. -If delivery is indicated during her hospital stay, we will manage accordingly and decide on modality depending on the indication for delivery.  -We will continue to follow patient closely. Daily FHR assessment will be done by our Rapid Response OB Nurse as directed, and weekly ultrasounds by our MFM team.  Please call [phone] [facility] OB[facility] Consult Attending Monday-Friday 8am - 5pm) or [phone] [facility] OB/GYN Attending On Call all day, every day) for any obstetric concerns at any time.   Total consultation time including face-to-face time with patient (>50% of time), reviewing chart and documentation: 25 minutes   Verita Schneiders, MD, [place], [facility] for [employer], [place] Phone: [phone]

## 2020-07-22 NOTE — Progress Notes (Addendum)
Advanced Heart Failure Rounding Note   Subjective:   Yesterday switched to po lasix.   ? related to septic embolization to her pregnancy vs lumbar spine disease. MRI ordered and later cancelled because she can't lie flat.  ID cancelled MRI.    Complaining of teeth pain.   Objective:   Weight Range:  Vital Signs:   Temp:  [97.9 F (36.6 C)] 97.9 F (36.6 C) (03/11 0832) Pulse Rate:  [77-91] 87 (03/11 0832) Resp:  [16-18] 18 (03/11 0832) BP: (109-125)/(64-92) 119/92 (03/11 0832) SpO2:  [93 %-97 %] 97 % (03/11 0459) Weight:  [81.4 kg] 81.4 kg (03/11 0500) Last BM Date: 07/14/20  Weight change: Filed Weights   07/19/20 0519 07/21/20 0500 07/22/20 0500  Weight: 84.4 kg 79 kg 81.4 kg    Intake/Output:   Intake/Output Summary (Last 24 hours) at 07/22/2020 1139 Last data filed at 07/22/2020 7408 Gross per 24 hour  Intake 370 ml  Output 700 ml  Net -330 ml     PHYSICAL EXAM: \General:   No resp difficulty. Sitting on the side of the bed.  HEENT: normal Neck: supple. no JVD. Carotids 2+ bilat; no bruits. No lymphadenopathy or thryomegaly appreciated. Cor: PMI nondisplaced. Regular rate & rhythm. No rubs, gallops. 2/6 TR  Lungs: clear Abdomen: pregnant, soft, nontender, nondistended. No hepatosplenomegaly. No bruits or masses. Good bowel sounds. Extremities: no cyanosis, clubbing, rash, edema Neuro: alert & orientedx3, cranial nerves grossly intact. moves all 4 extremities w/o difficulty. Affect pleasant  Telemetry: NSR 90s personally reviewed.  Labs: Basic Metabolic Panel: Recent Labs  Lab 07/18/20 0500 07/19/20 1112 07/20/20 0500 07/21/20 1004 07/22/20 0505 07/22/20 0939  NA 131* 130* 129* 130* 131*  --   K 3.8 4.7 4.5 4.7 4.5  --   CL 93* 93* 90* 92* 92*  --   CO2 27 28 27 28 30   --   GLUCOSE 95 80 85 90 86  --   BUN 8 8 11 18 20   --   CREATININE 0.52 0.51 0.46 0.53 0.59  --   CALCIUM 8.6* 9.2 9.2 9.8 9.6  --   MG  --   --  1.8  --   --  1.9     Liver Function Tests: Recent Labs  Lab 07/16/20 0333 07/17/20 0622  AST 11* 11*  ALT 6 <5  ALKPHOS 130* 163*  BILITOT 0.6 0.7  PROT 6.0* 6.1*  ALBUMIN 1.7* 1.7*   No results for input(s): LIPASE, AMYLASE in the last 168 hours. No results for input(s): AMMONIA in the last 168 hours.  CBC: Recent Labs  Lab 07/17/20 0622 07/18/20 0500 07/20/20 0500 07/21/20 1004 07/22/20 0505  WBC 11.8* 12.5* 17.3* 17.5* 16.6*  HGB 7.5* 8.4* 9.4* 10.9* 10.7*  HCT 23.6* 26.5* 29.2* 33.8* 33.0*  MCV 94.4 94.3 91.5 91.1 90.9  PLT 299 317 426* 483* 466*    Cardiac Enzymes: No results for input(s): CKTOTAL, CKMB, CKMBINDEX, TROPONINI in the last 168 hours.  BNP: BNP (last 3 results) Recent Labs    07/13/20 1219  BNP 157.1*    ProBNP (last 3 results) No results for input(s): PROBNP in the last 8760 hours.    Other results:  Imaging: [place] MFM OB LIMITED  Result Date: 07/20/2020 ----------------------------------------------------------------------  OBSTETRICS REPORT                       (Signed Final 07/20/2020 05:40 pm) ---------------------------------------------------------------------- Patient Info  ID #:  [id number]                          D.O.B.:  15-Mar-1986 (34 yrs)  Name:       Kelly [name]              Visit Date: 07/20/2020 11:46 am ---------------------------------------------------------------------- Performed By  Attending:        Johnell Comings MD         Ref. Address:     [place], [place]  Performed By:     Berlinda Last          [facility]  Referred By:      Osborne Oman MD ----------------------------------------------------------------------  Orders  #  Description                           Code        Ordered By  1  [place] MFM OB LIMITED                     [id number]    Verita Schneiders ----------------------------------------------------------------------  #  Order #                     Accession #                Episode #  1  [id number]                   [id number]                 [id number] ---------------------------------------------------------------------- Indications  Drug use complicating pregnancy, second        O99.322  trimester  [redacted] weeks gestation of pregnancy                Z3A.27  Advanced maternal age  multigravida 35+,        O09.522  second trimester  Previous cesarean delivery, antepartum x1      O34.219  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 Cord Insertion:      Marginal insertion  Amniotic Fluid  AFI FV:      Within normal limits                              Largest Pocket(cm)                              5.2 ---------------------------------------------------------------------- OB History  Gravidity:    7         Term:   3        Prem:   0        SAB:   3  TOP:          0       Ectopic:  0        Living: 3 ---------------------------------------------------------------------- Gestational Age  Best:          27w 1d     Det. By:  U/S  (07/13/20)          EDD:   10/18/20 ---------------------------------------------------------------------- Anatomy  Stomach:               Appears normal, left   Bladder:                Appears normal                         sided ---------------------------------------------------------------------- Cervix Uterus Adnexa  Cervix  Not visualized (advanced GA >24wks)  Adnexa  No  abnormality visualized. 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  She is currently  treated with vancomycin and the plan is for her to complete a  6-week course of vancomycin as an inpatient.  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  She  is being followed by the heart failure team.  A limited ultrasound performed today shows that the fetus is  in the breech presentation.  There was normal amniotic fluid noted.  During the performance of her ultrasound today, a possible  dip in the fetal heart rate down to the low 100s was noted on  the M-mode Dopplers.  The actual heart rate was difficult to  decipher based on the M mode Doppler exam.  The patient cooperated with fetal heart rate monitoring this  afternoon.  The baseline fetal heart rate was noted to be in  the 140s, with moderate variability and small accelerations.  The heart rate tracing was reassuring  for her gestational age.  Due to the reported risk of fetal mortality (10% to 15%) in  pregnant women with infectious endocarditis, the plan will be  to continue daily fetal heart rate monitoring for 20 minutes  daily while she is hospitalized (if patient is cooperative).  We  will start weekly biophysical profiles and amniotic fluid checks  at 28 weeks. ----------------------------------------------------------------------                   Johnell Comings, MD Electronically Signed Final Report   07/20/2020 05:40 pm ----------------------------------------------------------------------    Medications:     Scheduled Medications:  sodium chloride   Intravenous Once   acetaminophen  650 mg Oral Q6H   amoxicillin-clavulanate  1 tablet Oral Q12H   Chlorhexidine Gluconate Cloth  6  each Topical Daily   enoxaparin (LOVENOX) injection  60 mg Subcutaneous Q24H   furosemide  40 mg Oral BID   methadone  30 mg Oral TID PC   polyethylene glycol  17 g Oral BID   prenatal vitamin w/FE, FA  1 tablet Oral Q1200   senna-docusate  1 tablet Oral BID    Infusions:  vancomycin 750 mg (07/22/20 0732)    PRN Medications: HYDROmorphone (DILAUDID) injection, prochlorperazine, sodium chloride flush   Assessment/Plan:   1. MRSA Sepsis  - bcx 2/2 + for MRSA - clinically improved with IV abx and volume resuscitation.  - ID has seen and adjusting abx. Will need 6 weeks.  - Bld Cx 3/4 - No growth.  - no obvious vegetation on valve on TTE but assume she has recurrent endocarditis and would treat for 6 weeks with IV abx. D/w ID and both agree that TEE would not change management at this point as valve known to be heavily damaged from previous episodes and currently not candidate for operative repair.  -Plan to keep in the hospital until IV antibiotics completed. 08/19/20  2. Acute on Chronic Systolic CHF with Mostly Right Sided CHF (Likely Secondary to Severe TR) - Echo 07/13/20 showed normalization of LV function  LVEF of 55% with normal wall motion. RV severely enlarged with moderately reduced systolic function and severely elevated PASP of 92.46mHg. Severe TR noted but no vegetation visualized.  - Volume status stable. Continue po lasix 40 mg twice a day.  - Renal function stable.   3. 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 He was seen by Dr. GServando Snarein 05/2019 for possible surgical repair. However, patient was lost to follow-up and unfortunately started using drugs again. - Echo this admission shows severe TR but no signs of vegetation.  4. 27w 3d pregnant - u/s ok - OB following - Case d/w high-risk OB team who is following with daily fetal monitoring  5.  RPR + - ID sent confirmatory test - Given penicillin.   6. Polysubstance Abuse -Had using IV heroine + fentanyl - On methadone.   7 Hyponatremia - likely due to hypervolemia - Check BMET    Length of Stay: 9[place]  07/22/2020, 11:39 AM  Advanced Heart Failure Team Pager [phone](M-F; [place]  Please contact [facility] for night-coverage after hours (4p -7a ) and weekends on [email or web address]  Patient seen and examined with the above-signed Advanced Practice Provider and/or Housestaff. I personally reviewed laboratory data, imaging studies and relevant notes. I independently examined the patient and formulated the  important aspects of the plan. I have edited the note to reflect any of my changes or salient points. I have personally discussed the plan with the patient and/or family.  She continues to complain of mouth pain but then falls back to sleep. Denies SOB.   General:  Lying in bed. Sleepy  HEENT: normal Neck: supple. No obvious  JVD. Carotids 2+ bilat; no bruits. No lymphadenopathy or thryomegaly appreciated. Cor: PMI nondisplaced. Regular rate & rhythm. 2/6 TR Lungs: clear Abdomen: soft, nontender, gravid. No hepatosplenomegaly. No bruits or masses. Good bowel sounds. Extremities: no cyanosis, clubbing, rash, tr edema Neuro: alert & orientedx3, cranial nerves grossly intact. moves all 4 extremities w/o difficulty. Affect pleasant  Volume status much improved. Will decrease lasix to 40 daily. Avoid overdiuresis. Continue IV abx. We will follow at a distance. Call with questions.   Glori Bickers, MD  3:26 PM

## 2020-07-23 ENCOUNTER — Inpatient Hospital Stay: Payer: Self-pay

## 2020-07-23 DIAGNOSIS — B9562 Methicillin resistant Staphylococcus aureus infection as the cause of diseases classified elsewhere: Secondary | ICD-10-CM | POA: Diagnosis not present

## 2020-07-23 DIAGNOSIS — R7881 Bacteremia: Secondary | ICD-10-CM | POA: Diagnosis not present

## 2020-07-23 LAB — BASIC METABOLIC PANEL
Anion gap: 9 (ref 5–15)
BUN: 17 mg/dL (ref 6–20)
CO2: 26 mmol/L (ref 22–32)
Calcium: 9.4 mg/dL (ref 8.9–10.3)
Chloride: 94 mmol/L — ABNORMAL LOW (ref 98–111)
Creatinine, Ser: 0.52 mg/dL (ref 0.44–1.00)
GFR, Estimated: >60 mL/min (ref >60)
Glucose, Bld: 76 mg/dL (ref 70–99)
Potassium: 4.2 mmol/L (ref 3.5–5.1)
Sodium: 129 mmol/L — ABNORMAL LOW (ref 135–145)

## 2020-07-23 LAB — CBC
HCT: 30.3 % — ABNORMAL LOW (ref 36.0–46.0)
Hemoglobin: 9.8 g/dL — ABNORMAL LOW (ref 12.0–15.0)
MCH: 29.3 pg (ref 26.0–34.0)
MCHC: 32.3 g/dL (ref 30.0–36.0)
MCV: 90.7 fL (ref 80.0–100.0)
Platelets: 437 10*3/uL — ABNORMAL HIGH (ref 150–400)
RBC: 3.34 MIL/uL — ABNORMAL LOW (ref 3.87–5.11)
RDW: 13.8 % (ref 11.5–15.5)
WBC: 15.8 10*3/uL — ABNORMAL HIGH (ref 4.0–10.5)
nRBC: 0 % (ref 0.0–0.2)

## 2020-07-23 MED ORDER — SODIUM CHLORIDE 0.9% FLUSH
10.0000 mL | INTRAVENOUS | Status: DC | PRN
  Administered 2020-07-26: 10 mL

## 2020-07-23 MED ORDER — SODIUM CHLORIDE 0.9% FLUSH
10.0000 mL | Freq: Two times a day (BID) | INTRAVENOUS | Status: DC
  Administered 2020-07-23: 10 mL
  Administered 2020-07-23: 40 mL
  Administered 2020-07-24: 10 mL
  Administered 2020-07-24: 30 mL
  Administered 2020-07-25 – 2020-07-28 (×8): 10 mL
  Administered 2020-07-29: 30 mL
  Administered 2020-07-30 – 2020-07-31 (×5): 10 mL
  Administered 2020-08-01: 20 mL
  Administered 2020-08-01 – 2020-08-25 (×38): 10 mL

## 2020-07-23 NOTE — Progress Notes (Signed)
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 -Current active IV heroin user, uses about 3 g daily and IV fentanyl, presented to the ED with 3 to 4 weeks history of bilateral lower extremity edema associated with worsening dyspnea on exertion for 4 days, intermittent fever for several weeks and pain with deep inspiration across her right lateral chest and flank. -In the ED she was noted to be fluid overload in addition blood cultures were positive for MRSA and she was a started on IV vancomycin. -2D echo without obvious vegetation, TEE was deferred and plan is to treat empirically for endocarditis -Patient was also found to be 26 weeks Pregnancy.   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)   Tricuspid regurgitation   Preexisting hypertension complicating pregnancy, antepartum   Dental caries   Endocarditis of tricuspid valve   History of VBAC   Lumbar back pain   Acute on chronic congestive heart failure (HCC)   Cellulitis of right lower extremity   Septic embolism (HCC)   Syphilis   [redacted] weeks gestation of pregnancy   Flank pain   Drug use affecting pregnancy in second trimester   Maternal endocarditis affecting pregnancy in second trimester, antepartum   Methadone maintenance treatment affecting pregnancy ([place])   Heart  disease in mother complicating pregnancy  1-MRSA bacteremia, presumed endocarditis: -Continue with IV vancomycin, ID following. -2D echo without obvious vegetation, TEE defer, plan to empirically treat as endocarditis. -Repeated blood cultures: So far negative. -Patient will probably need 6 weeks of IV antibiotics inpatient. -Continue IV antibiotics.   2-Acute on chronic Right side Heart Failure -Severe TR -Severe pulmonary hypertension -2D echo noted decreased RV systolic function, elevated PA pressure 92 severe TR. -Appreciate heart failure team management -Transition to oral lasix 3-12.  Appreciate cardiology assistance.  --13 L -Stable.   3-Chronic Anemia: -Anemia panel: B12 445, folate 7.4, iron 32, ferritin 111. -Hemoglobin baseline 8.5-9.5 -Hb stable. At 10--9.8  4-IV heroin and fentanyl abuse Chronic pain Continue with methadone 30 mg 3 times daily IV Dilaudid as well.   5-26 weeks pregnancy: -OB service following, getting daily fetal heart assessment. -Counseling provided to patient regarding pain medication and effect to fetus.   6-Pneumonia: Possible septic pulmonary infarcts. 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 -Given penicillin 2.4 MU intramuscular  8-back pain: Unable to tolerate MRI, discontinue with ID 10-Hyponatremia: monitor on Lasix 11-Jaw pain, ear pain. Started on Augmentin. Order Mouth wash. Orthopantogram : multiples caries.  Dentist on call recommended follow up in his clinic. Will call Dental clinic at [facility] on Monday   Estimated body mass index is 28.24 kg/m as calculated from the following:   Height as of this encounter: 5' 7.5" (1.715 m).   Weight as of this encounter: 83 kg.   DVT prophylaxis: lovenox Code Status: Full cdoe  Family Communication: care discussed with patient/ Disposition Plan:  Status is: Inpatient  Remains inpatient appropriate because:IV  treatments appropriate due to intensity of illness or inability to take PO   Dispo: The patient is from: Home              Anticipated d/c is to: Home              Patient currently is not medically stable to d/c.   Difficult to place patient No        Consultants:   ID  GYN  Cardiology   Procedures:   None  Antimicrobials:  IV Vancomycin   Subjective: She is resting in bed today, does not appears in distress  Objective: Vitals:   07/22/20 2050 07/23/20 0456 07/23/20 0457 07/23/20 0842  BP: 118/80 115/73  119/77  Pulse: 93  96 98  Resp: 16  17 16   Temp: 97.9 F (36.6 C)  97.9 F (36.6 C) 98 F (36.7 C)  TempSrc: Oral  Oral Oral  SpO2: 98%  99% 98%  Weight:   83 kg   Height:        Intake/Output Summary (Last 24 hours) at 07/23/2020 1540 Last data filed at 07/23/2020 0457 Gross per 24 hour  Intake --  Output 850 ml  Net -850 ml   Filed Weights   07/21/20 0500 07/22/20 0500 07/23/20 0457  Weight: 79 kg 81.4 kg 83 kg    Examination:  General exam: Chronic ill appearing, ill appearing Respiratory system: CTA Cardiovascular system: S 1, S 2 RRR Gastrointestinal system: BS present, soft, nt GU:  Gravid uterus  Central nervous system: Alert Extremities: trace edema    Data Reviewed: I have personally reviewed following labs and imaging studies  CBC: Recent Labs  Lab 07/18/20 0500 07/20/20 0500 07/21/20 1004 07/22/20 0505 07/23/20 0200  WBC 12.5* 17.3* 17.5* 16.6* 15.8*  HGB 8.4* 9.4* 10.9* 10.7* 9.8*  HCT 26.5* 29.2* 33.8* 33.0* 30.3*  MCV 94.3 91.5 91.1 90.9 90.7  PLT 317 426* 483* 466* 850*   Basic Metabolic Panel: Recent Labs  Lab 07/19/20 1112 07/20/20 0500 07/21/20 1004 07/22/20 0505 07/22/20 0939 07/23/20 0200  NA 130* 129* 130* 131*  --  129*  K 4.7 4.5 4.7 4.5  --  4.2  CL 93* 90* 92* 92*  --  94*  CO2 28 27 28 30   --  26  GLUCOSE 80 85 90 86  --  76  BUN 8 11 18 20   --  17  CREATININE 0.51 0.46 0.53 0.59  --  0.52   CALCIUM 9.2 9.2 9.8 9.6  --  9.4  MG  --  1.8  --   --  1.9  --    GFR: Estimated Creatinine Clearance: 109.9 mL/min (by C-G formula based on SCr of 0.52 mg/dL). Liver Function Tests: Recent Labs  Lab 07/17/20 0622  AST 11*  ALT <5  ALKPHOS 163*  BILITOT 0.7  PROT 6.1*  ALBUMIN 1.7*   No results for input(s): LIPASE, AMYLASE in the last 168 hours. No results for input(s): AMMONIA in the last 168 hours. Coagulation Profile: No results for input(s): INR, PROTIME in the last 168 hours. Cardiac Enzymes: No results for input(s): CKTOTAL, CKMB, CKMBINDEX, TROPONINI in the last 168 hours. BNP (last 3 results) No results for input(s): PROBNP in the last 8760 hours. HbA1C: No results for input(s): HGBA1C in the last 72 hours. CBG: Recent Labs  Lab 07/21/20 1210  GLUCAP 121*  Lipid Profile: No results for input(s): CHOL, HDL, LDLCALC, TRIG, CHOLHDL, LDLDIRECT in the last 72 hours. Thyroid Function Tests: No results for input(s): TSH, T4TOTAL, FREET4, T3FREE, THYROIDAB in the last 72 hours. Anemia Panel: No results for input(s): VITAMINB12, FOLATE, FERRITIN, TIBC, IRON, RETICCTPCT in the last 72 hours. Sepsis Labs: No results for input(s): PROCALCITON, LATICACIDVEN in the last 168 hours.  Recent Results (from the past 240 hour(s))  MRSA PCR Screening     Status: Abnormal   Collection Time: 07/13/20  8:37 PM   Specimen: Nasal Mucosa; Nasopharyngeal  Result Value Ref Range Status   MRSA by PCR POSITIVE (A) NEGATIVE Final    Comment:        The GeneXpert MRSA Assay (FDA approved for NASAL specimens only), is one component of a comprehensive MRSA colonization surveillance program. It is not intended to diagnose MRSA infection nor to guide or monitor treatment for MRSA infections. RESULT CALLED TO, READ BACK BY AND VERIFIED WITH: TOLER,M RN 07/13/2020 AT 2319 SKEEN,P Performed at [facility], [place] [address] [place], [place] [address]   Urine culture     Status:  None   Collection Time: 07/13/20 10:10 PM   Specimen: Urine, Clean Catch  Result Value Ref Range Status   Specimen Description URINE, CLEAN CATCH  Final   Special Requests NONE  Final   Culture   Final    NO GROWTH Performed at [facility], [address], [place], [place] [address]    Report Status 07/15/2020 FINAL  Final  Culture, blood (routine x 2)     Status: None   Collection Time: 07/15/20  6:49 PM   Specimen: BLOOD  Result Value Ref Range Status   Specimen Description BLOOD RIGHT ANTECUBITAL  Final   Special Requests   Final    BOTTLES DRAWN AEROBIC AND ANAEROBIC BACTEROIDES CACCAE   Culture   Final    NO GROWTH 5 DAYS Performed at [facility], [place] [address] [place], [place] [address]    Report Status 07/20/2020 FINAL  Final  Culture, blood (routine x 2)     Status: None   Collection Time: 07/15/20  6:50 PM   Specimen: BLOOD  Result Value Ref Range Status   Specimen Description BLOOD BLOOD RIGHT FOREARM  Final   Special Requests   Final    BOTTLES DRAWN AEROBIC AND ANAEROBIC Blood Culture adequate volume   Culture   Final    NO GROWTH 5 DAYS Performed at [facility], [place] [address] [place],  [address]    Report Status 07/20/2020 FINAL  Final         Radiology Studies: DG Orthopantogram  Result Date: 07/22/2020 CLINICAL DATA:  Dental pain EXAM: ORTHOPANTOGRAM/PANORAMIC COMPARISON:  06/25/2018 FINDINGS: Negative for fracture. Interval extraction of right lower molars. Interval healing of periapical lucency. Multiple caries are present including the upper canine on the right, and left lower premolar and molar teeth. No periapical lucency identified. IMPRESSION: Multiple caries. Electronically Signed   By: Franchot Gallo M.D.   On: 07/22/2020 16:16   [place] EKG SITE RITE  Result Date: 07/23/2020 If Site Rite image not attached, placement could not be confirmed due to current cardiac rhythm.       Scheduled Meds: . sodium chloride    Intravenous Once  . acetaminophen  650 mg Oral Q6H  . amoxicillin-clavulanate  1 tablet Oral Q12H  . Chlorhexidine Gluconate Cloth  6 each Topical Daily  .  enoxaparin (LOVENOX) injection  60 mg Subcutaneous Q24H  . furosemide  40 mg Oral Daily  . methadone  30 mg Oral TID PC  . polyethylene glycol  17 g Oral BID  . prenatal vitamin w/FE, FA  1 tablet Oral Q1200  . senna-docusate  1 tablet Oral BID  . sodium chloride flush  10-40 mL Intracatheter Q12H   Continuous Infusions: . vancomycin 750 mg (07/23/20 0816)     LOS: 10 days    Time spent: 35 minutes    Mazi Schuff A Dora Clauss, MD [facility]   If 7PM-7AM, please contact night-coverage [email or web address]  07/23/2020, 3:40 PM

## 2020-07-23 NOTE — Progress Notes (Signed)
NST completed. Pt sleeping through fetal monitoring. Denies vaginal bleeding or leaking of fluid. No uc's seen on fetal tracing. None palpated.

## 2020-07-23 NOTE — Progress Notes (Signed)
Spoke with Dr. Kennon Rounds. NST completed. Baseline 140 BPM, minimal-moderate variabiluty, 10x10 accels, no decels.

## 2020-07-23 NOTE — Progress Notes (Signed)
Peripherally Inserted Central Catheter Placement  The IV Nurse has discussed with the patient and/or persons authorized to consent for the patient, the purpose of this procedure and the potential benefits and risks involved with this procedure.  The benefits include less needle sticks, lab draws from the catheter, and the patient may be discharged home with the catheter. Risks include, but not limited to, infection, bleeding, blood clot (thrombus formation), and puncture of an artery; nerve damage and irregular heartbeat and possibility to perform a PICC exchange if needed/ordered by physician.  Alternatives to this procedure were also discussed.  Bard Power PICC patient education guide, fact sheet on infection prevention and patient information card has been provided to patient /or left at bedside.    PICC Placement Documentation  PICC Single Lumen 49/44/96 PICC Right Basilic 36 cm 0 cm (Active)  Indication for Insertion or Continuance of Line Prolonged intravenous therapies 07/23/20 1239  Exposed Catheter (cm) 0 cm 07/23/20 1239  Site Assessment Clean;Dry;Intact 07/23/20 1239  Line Status Flushed;Saline locked;Blood return noted 07/23/20 1239  Dressing Type Transparent 07/23/20 1239  Dressing Status Clean;Dry;Intact 07/23/20 1239  Antimicrobial disc in place? Yes 07/23/20 1239  Safety Lock Not Applicable 75/91/63 8466  Line Care Tubing changed;Connections checked and tightened 07/23/20 1239  Line Adjustment (NICU/IV Team Only) No 07/23/20 1239  Dressing Intervention New dressing 07/23/20 1239  Dressing Change Due 07/30/20 07/23/20 1239       [name] 07/23/2020, 12:41 PM

## 2020-07-24 DIAGNOSIS — B9562 Methicillin resistant Staphylococcus aureus infection as the cause of diseases classified elsewhere: Secondary | ICD-10-CM | POA: Diagnosis not present

## 2020-07-24 DIAGNOSIS — R7881 Bacteremia: Secondary | ICD-10-CM | POA: Diagnosis not present

## 2020-07-24 LAB — BASIC METABOLIC PANEL
Anion gap: 9 (ref 5–15)
BUN: 13 mg/dL (ref 6–20)
CO2: 24 mmol/L (ref 22–32)
Calcium: 9.1 mg/dL (ref 8.9–10.3)
Chloride: 97 mmol/L — ABNORMAL LOW (ref 98–111)
Creatinine, Ser: 0.48 mg/dL (ref 0.44–1.00)
GFR, Estimated: >60 mL/min (ref >60)
Glucose, Bld: 83 mg/dL (ref 70–99)
Potassium: 3.7 mmol/L (ref 3.5–5.1)
Sodium: 130 mmol/L — ABNORMAL LOW (ref 135–145)

## 2020-07-24 LAB — CBC
HCT: 28.6 % — ABNORMAL LOW (ref 36.0–46.0)
Hemoglobin: 9.6 g/dL — ABNORMAL LOW (ref 12.0–15.0)
MCH: 30.1 pg (ref 26.0–34.0)
MCHC: 33.6 g/dL (ref 30.0–36.0)
MCV: 89.7 fL (ref 80.0–100.0)
Platelets: 442 10*3/uL — ABNORMAL HIGH (ref 150–400)
RBC: 3.19 MIL/uL — ABNORMAL LOW (ref 3.87–5.11)
RDW: 13.8 % (ref 11.5–15.5)
WBC: 12.4 10*3/uL — ABNORMAL HIGH (ref 4.0–10.5)
nRBC: 0 % (ref 0.0–0.2)

## 2020-07-24 NOTE — Progress Notes (Signed)
NST reactive. No vaginal bleeding or leaking of fluid. No c/o abd pain. Dr. Kennon Rounds notified

## 2020-07-24 NOTE — Progress Notes (Signed)
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 -Current active IV heroin user, uses about 3 g daily and IV fentanyl, presented to the ED with 3 to 4 weeks history of bilateral lower extremity edema associated with worsening dyspnea on exertion for 4 days, intermittent fever for several weeks and pain with deep inspiration across her right lateral chest and flank. -In the ED she was noted to be fluid overload in addition blood cultures were positive for MRSA and she was a started on IV vancomycin. -2D echo without obvious vegetation, TEE was deferred and plan is to treat empirically for endocarditis -Patient was also found to be 26 weeks Pregnancy.   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)   Tricuspid regurgitation   Preexisting hypertension complicating pregnancy, antepartum   Dental caries   Endocarditis of tricuspid valve   History of VBAC   Lumbar back pain   Acute on chronic congestive heart failure (HCC)   Cellulitis of right lower extremity   Septic embolism (HCC)   Syphilis   [redacted] weeks gestation of pregnancy   Flank pain   Drug use affecting pregnancy in second trimester   Maternal endocarditis affecting pregnancy in second trimester, antepartum   Methadone maintenance treatment affecting pregnancy ([place])   Heart  disease in mother complicating pregnancy  1-MRSA bacteremia, presumed endocarditis: -Continue with IV vancomycin, ID following. -2D echo without obvious vegetation, TEE defer, plan to empirically treat as endocarditis. -Repeated blood cultures: So far negative. -Patient will probably need 6 weeks of IV antibiotics inpatient. -Continue IV antibiotics.   2-Acute on chronic Right side Heart Failure -Severe TR -Severe pulmonary hypertension -2D echo noted decreased RV systolic function, elevated PA pressure 92 severe TR. -Appreciate heart failure team management -Transition to oral lasix 3-12.  Appreciate cardiology assistance.  --13 L. Stable.   3-Chronic Anemia: -Anemia panel: B12 445, folate 7.4, iron 32, ferritin 111. -Hemoglobin baseline 8.5-9.5 -Hb stable. At 10--9.8  4-IV heroin and fentanyl abuse Chronic pain Continue with methadone 30 mg 3 times daily IV Dilaudid as well.   5-26 weeks pregnancy: -OB service following, getting daily fetal heart assessment. -Counseling provided to patient regarding pain medication and effect to fetus.   6-Pneumonia: Possible septic pulmonary infarcts. 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 -Given penicillin 2.4 MU intramuscular  8-back pain: Unable to tolerate MRI, discontinue with ID 10-Hyponatremia: monitor on Lasix 11-Jaw pain, ear pain. Started on Augmentin. Order Mouth wash. Orthopantogram : multiples caries.  Dentist on call recommended follow up in his clinic. Will call [facility] at [facility] on Monday   Estimated body mass index is 27 kg/m as calculated from the following:   Height as of this encounter: 5' 7.5" (1.715 m).   Weight as of this encounter: 79.4 kg.   DVT prophylaxis: lovenox Code Status: Full cdoe  Family Communication: care discussed with patient/ Disposition Plan:  Status is: Inpatient  Remains inpatient appropriate because:IV treatments  appropriate due to intensity of illness or inability to take PO   Dispo: The patient is from: Home              Anticipated d/c is to: Home              Patient currently is not medically stable to d/c.   Difficult to place patient No        Consultants:   ID  GYN  Cardiology   Procedures:   None  Antimicrobials:  IV Vancomycin   Subjective: She is smiling today, side pain improving. Still complaining of jaw pain.  Denies dyspnea.   Objective: Vitals:   07/23/20 0842 07/23/20 2019 07/24/20 0327 07/24/20 0855  BP: 119/77 132/71 120/82 116/79  Pulse: 98 96 87 (!) 104  Resp: 16 18 20 18   Temp: 98 F (36.7 C) 98 F (36.7 C) 98.2 F (36.8 C) 98 F (36.7 C)  TempSrc: Oral Oral Oral Oral  SpO2: 98% 99% 99% 95%  Weight:   79.4 kg   Height:        Intake/Output Summary (Last 24 hours) at 07/24/2020 1417 Last data filed at 07/24/2020 1000 Gross per 24 hour  Intake 70 ml  Output --  Net 70 ml   Filed Weights   07/22/20 0500 07/23/20 0457 07/24/20 0327  Weight: 81.4 kg 83 kg 79.4 kg    Examination:  General exam: Chronic ill appearing Respiratory system: CTA Cardiovascular system: S 1, S 2 RRR Gastrointestinal system: BS present, soft, nt GU:  Gravid uterus  Central nervous system: alert Extremities: Trace edema    Data Reviewed: I have personally reviewed following labs and imaging studies  CBC: Recent Labs  Lab 07/20/20 0500 07/21/20 1004 07/22/20 0505 07/23/20 0200 07/24/20 0348  WBC 17.3* 17.5* 16.6* 15.8* 12.4*  HGB 9.4* 10.9* 10.7* 9.8* 9.6*  HCT 29.2* 33.8* 33.0* 30.3* 28.6*  MCV 91.5 91.1 90.9 90.7 89.7  PLT 426* 483* 466* 437* 951*   Basic Metabolic Panel: Recent Labs  Lab 07/20/20 0500 07/21/20 1004 07/22/20 0505 07/22/20 0939 07/23/20 0200 07/24/20 0348  NA 129* 130* 131*  --  129* 130*  K 4.5 4.7 4.5  --  4.2 3.7  CL 90* 92* 92*  --  94* 97*  CO2 27 28 30   --  26 24  GLUCOSE 85 90 86  --  76 83  BUN 11 18 20   --  17  13  CREATININE 0.46 0.53 0.59  --  0.52 0.48  CALCIUM 9.2 9.8 9.6  --  9.4 9.1  MG 1.8  --   --  1.9  --   --    GFR: Estimated Creatinine Clearance: 107.5 mL/min (by C-G formula based on SCr of 0.48 mg/dL). Liver Function Tests: No results for input(s): AST, ALT, ALKPHOS, BILITOT, PROT, ALBUMIN in the last 168 hours. No results for input(s): LIPASE, AMYLASE in the last 168 hours. No results for input(s): AMMONIA in the last 168 hours. Coagulation Profile: No results for input(s): INR, PROTIME in the last 168 hours. Cardiac Enzymes: No results for input(s): CKTOTAL, CKMB, CKMBINDEX, TROPONINI in the last 168 hours. BNP (last 3 results) No results for input(s): PROBNP in the last 8760 hours. HbA1C: No results for input(s): HGBA1C in the last 72 hours. CBG: Recent Labs  Lab 07/21/20 1210  GLUCAP 121*   Lipid Profile:  No results for input(s): CHOL, HDL, LDLCALC, TRIG, CHOLHDL, LDLDIRECT in the last 72 hours. Thyroid Function Tests: No results for input(s): TSH, T4TOTAL, FREET4, T3FREE, THYROIDAB in the last 72 hours. Anemia Panel: No results for input(s): VITAMINB12, FOLATE, FERRITIN, TIBC, IRON, RETICCTPCT in the last 72 hours. Sepsis Labs: No results for input(s): PROCALCITON, LATICACIDVEN in the last 168 hours.  Recent Results (from the past 240 hour(s))  Culture, blood (routine x 2)     Status: None   Collection Time: 07/15/20  6:49 PM   Specimen: BLOOD  Result Value Ref Range Status   Specimen Description BLOOD RIGHT ANTECUBITAL  Final   Special Requests   Final    BOTTLES DRAWN AEROBIC AND ANAEROBIC BACTEROIDES CACCAE   Culture   Final    NO GROWTH 5 DAYS Performed at [facility], [address], [place], [place] [address]    Report Status 07/20/2020 FINAL  Final  Culture, blood (routine x 2)     Status: None   Collection Time: 07/15/20  6:50 PM   Specimen: BLOOD  Result Value Ref Range Status   Specimen Description BLOOD BLOOD RIGHT FOREARM  Final    Special Requests   Final    BOTTLES DRAWN AEROBIC AND ANAEROBIC Blood Culture adequate volume   Culture   Final    NO GROWTH 5 DAYS Performed at [facility], [place] [address], [place], [place] [address]    Report Status 07/20/2020 FINAL  Final         Radiology Studies: DG Orthopantogram  Result Date: 07/22/2020 CLINICAL DATA:  Dental pain EXAM: ORTHOPANTOGRAM/PANORAMIC COMPARISON:  06/25/2018 FINDINGS: Negative for fracture. Interval extraction of right lower molars. Interval healing of periapical lucency. Multiple caries are present including the upper canine on the right, and left lower premolar and molar teeth. No periapical lucency identified. IMPRESSION: Multiple caries. Electronically Signed   By: Franchot Gallo M.D.   On: 07/22/2020 16:16   [place] EKG SITE RITE  Result Date: 07/23/2020 If Site Rite image not attached, placement could not be confirmed due to current cardiac rhythm.       Scheduled Meds: . sodium chloride   Intravenous Once  . acetaminophen  650 mg Oral Q6H  . amoxicillin-clavulanate  1 tablet Oral Q12H  . Chlorhexidine Gluconate Cloth  6 each Topical Daily  . enoxaparin (LOVENOX) injection  60 mg Subcutaneous Q24H  . furosemide  40 mg Oral Daily  . methadone  30 mg Oral TID PC  . polyethylene glycol  17 g Oral BID  . prenatal vitamin w/FE, FA  1 tablet Oral Q1200  . senna-docusate  1 tablet Oral BID  . sodium chloride flush  10-40 mL Intracatheter Q12H   Continuous Infusions: . vancomycin 750 mg (07/24/20 0855)     LOS: 11 days    Time spent: 35 minutes    Raider Valbuena A Renald Haithcock, MD [facility]   If 7PM-7AM, please contact night-coverage [email or web address]  07/24/2020, 2:17 PM

## 2020-07-25 DIAGNOSIS — B9562 Methicillin resistant Staphylococcus aureus infection as the cause of diseases classified elsewhere: Secondary | ICD-10-CM | POA: Diagnosis not present

## 2020-07-25 DIAGNOSIS — M546 Pain in thoracic spine: Secondary | ICD-10-CM | POA: Diagnosis not present

## 2020-07-25 DIAGNOSIS — O99891 Other specified diseases and conditions complicating pregnancy: Secondary | ICD-10-CM | POA: Diagnosis not present

## 2020-07-25 DIAGNOSIS — R000 Tachycardia, unspecified: Secondary | ICD-10-CM

## 2020-07-25 DIAGNOSIS — R7881 Bacteremia: Secondary | ICD-10-CM | POA: Diagnosis not present

## 2020-07-25 DIAGNOSIS — F1190 Opioid use, unspecified, uncomplicated: Secondary | ICD-10-CM

## 2020-07-25 DIAGNOSIS — O99322 Drug use complicating pregnancy, second trimester: Secondary | ICD-10-CM | POA: Diagnosis not present

## 2020-07-25 DIAGNOSIS — O88312 Pyemic and septic embolism in pregnancy, second trimester: Secondary | ICD-10-CM

## 2020-07-25 DIAGNOSIS — O10012 Pre-existing essential hypertension complicating pregnancy, second trimester: Secondary | ICD-10-CM

## 2020-07-25 DIAGNOSIS — Z3A27 27 weeks gestation of pregnancy: Secondary | ICD-10-CM | POA: Diagnosis not present

## 2020-07-25 DIAGNOSIS — O99412 Diseases of the circulatory system complicating pregnancy, second trimester: Secondary | ICD-10-CM | POA: Diagnosis not present

## 2020-07-25 LAB — BASIC METABOLIC PANEL
Anion gap: 9 (ref 5–15)
BUN: 11 mg/dL (ref 6–20)
CO2: 29 mmol/L (ref 22–32)
Calcium: 9.2 mg/dL (ref 8.9–10.3)
Chloride: 95 mmol/L — ABNORMAL LOW (ref 98–111)
Creatinine, Ser: 0.5 mg/dL (ref 0.44–1.00)
GFR, Estimated: >60 mL/min (ref >60)
Glucose, Bld: 93 mg/dL (ref 70–99)
Potassium: 4.4 mmol/L (ref 3.5–5.1)
Sodium: 133 mmol/L — ABNORMAL LOW (ref 135–145)

## 2020-07-25 LAB — GLUCOSE, CAPILLARY: Glucose-Capillary: 93 mg/dL (ref 70–99)

## 2020-07-25 MED ORDER — ENOXAPARIN SODIUM 40 MG/0.4ML ~~LOC~~ SOLN
40.0000 mg | SUBCUTANEOUS | Status: DC
  Administered 2020-07-25 – 2020-08-24 (×27): 40 mg via SUBCUTANEOUS
  Filled 2020-07-25 (×32): qty 0.4

## 2020-07-25 MED ORDER — METHADONE HCL 10 MG PO TABS
100.0000 mg | ORAL_TABLET | Freq: Every day | ORAL | Status: DC
  Administered 2020-07-26 – 2020-08-08 (×14): 100 mg via ORAL
  Filled 2020-07-25 (×15): qty 10

## 2020-07-25 MED ORDER — HYDROMORPHONE HCL 1 MG/ML IJ SOLN
0.5000 mg | INTRAMUSCULAR | Status: DC | PRN
  Administered 2020-07-25 – 2020-07-30 (×25): 1 mg via INTRAVENOUS
  Administered 2020-07-31: 0.5 mg via INTRAVENOUS
  Administered 2020-07-31 – 2020-08-10 (×39): 1 mg via INTRAVENOUS
  Administered 2020-08-10: 0.5 mg via INTRAVENOUS
  Administered 2020-08-10 – 2020-08-18 (×31): 1 mg via INTRAVENOUS
  Filled 2020-07-25 (×98): qty 1

## 2020-07-25 NOTE — Progress Notes (Signed)
The time the fetal monitor was placed on pt was 1416, not 1316.

## 2020-07-25 NOTE — Progress Notes (Signed)
Physical Therapy Treatment Patient Details Name: Kelly [name] MRN: [id number] DOB: 01-15-1986 Today's Date: 07/25/2020    History of Present Illness Pt is a 35 y.o. female admitted 07/13/20 with R-side abdominal pain, dyspnea, BLE edema; of note, pt is [redacted] weeks pregnant on admission. Echo reveals severe RV failure, severe TR. CXR concerning for PNA. Workup for MRSA bacteremia, presumed endocarditis, acute on chronic R-side HF. Per obstetrics, no acute OB concerns. PMH includes current IVDA/polysubstance abuse (relapsed 5 months ago, last use 3/2), epidural abscess and MSSA bacteremia (2019), HTN, bipolar disorder, ADD, scoliosis with spinal rods.   PT Comments    Pt progressing with mobility. Pt with improved participation; noted improvements in pain, fatigue, SOB and stability. Encouraged more frequent hallway ambulation with use of RW. Will continue to follow acutely to address functional mobility deficits, including gait training without DME.    Follow Up Recommendations  No PT follow up     Equipment Recommendations   (TBD)    Recommendations for Other Services       Precautions / Restrictions Precautions Precautions: Fall;Other (comment) Precaution Comments: Abdominal for comfort - pregancy, lumbar/abdominal pain Restrictions Weight Bearing Restrictions: No    Mobility  Bed Mobility Overal bed mobility: Modified Independent                  Transfers Overall transfer level: Independent Equipment used: None Transfers: Sit to/from Stand              Ambulation/Gait Ambulation/Gait assistance: Min Gaffer (Feet): 200 Feet Assistive device: None;1 person hand held assist Gait Pattern/deviations: Step-through pattern;Decreased stride length;Trunk flexed Gait velocity: Decreased   General Gait Details: Slow, guarded gait without DME, pt requesting HHA for hallway ambulation with intermittent use of rail for additional UE support, pt  endorses lack of confidence ambulating; pt independently ambulating in room without DME. Encouraged use of RW for added confidence/stability with hallway ambulation without PT   Stairs             Wheelchair Mobility    Modified Rankin (Stroke Patients Only)       Balance Overall balance assessment: Needs assistance   Sitting balance-Leahy Scale: Good       Standing balance-Leahy Scale: Good               High level balance activites: Direction changes;Turns;Sudden stops;Head turns High Level Balance Comments: No overt instability or LOB with higher level balance tasks without UE support            Cognition Arousal/Alertness: Awake/alert Behavior During Therapy: WFL for tasks assessed/performed Overall Cognitive Status: Within Functional Limits for tasks assessed                                 General Comments: Improved cognition compared to evaluation; [facility] for majority of tasks. becoming more fatigued by end of session, suspect related to pain meds      Exercises      General Comments General comments (skin integrity, edema, etc.): Discussed that pt's mobility limitations appear more related to lack of confidence as opposed to pt's functional strength and balance; pt in agreement. Encouraged pt to use RW with hallway ambulation for added stability/comfort; RN in agreement pt can ambulate without assist (has been doing so in room)      Pertinent Vitals/Pain Pain Assessment: Faces Faces Pain Scale: Hurts a little bit Pain Location: R-side abdominal/back  pain Pain Descriptors / Indicators: Discomfort Pain Intervention(s): Monitored during session    Home Living                      Prior Function            PT Goals (current goals can now be found in the care plan section) Progress towards PT goals: Progressing toward goals    Frequency    Min 3X/week      PT Plan Current plan remains appropriate     Co-evaluation              AM-PAC PT "6 Clicks" Mobility   Outcome Measure  Help needed turning from your back to your side while in a flat bed without using bedrails?: None Help needed moving from lying on your back to sitting on the side of a flat bed without using bedrails?: None Help needed moving to and from a bed to a chair (including a wheelchair)?: None Help needed standing up from a chair using your arms (e.g., wheelchair or bedside chair)?: None Help needed to walk in hospital room?: A Little Help needed climbing 3-5 steps with a railing? : A Little 6 Click Score: 22    End of Session   Activity Tolerance: Patient tolerated treatment well Patient left: in bed;with call bell/phone within reach Nurse Communication: Mobility status PT Visit Diagnosis: Other abnormalities of gait and mobility (R26.89);Pain     Time: 0761-5183 PT Time Calculation (min) (ACUTE ONLY): 16 min  Charges:  $Gait Training: 8-22 mins                     Mabeline Caras, PT, DPT Acute Rehabilitation Services  Pager [phone] Office [place] 07/25/2020, 3:42 PM

## 2020-07-25 NOTE — Progress Notes (Signed)
NST completed. Baseline FHR 145BPM, moderate variability, 10x 10 accels, occasional variable decels. No uc's traced or palpated. Pt denies feeling uc's. No vaginal bleeding or leaking of fluid.Dr. Dione Plover notifed.

## 2020-07-25 NOTE — Progress Notes (Addendum)
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 -Current active IV heroin user, uses about 3 g daily and IV fentanyl, presented to the ED with 3 to 4 weeks history of bilateral lower extremity edema associated with worsening dyspnea on exertion for 4 days, intermittent fever for several weeks and pain with deep inspiration across her right lateral chest and flank. -In the ED she was noted to be fluid overload in addition blood cultures were positive for MRSA and she was a started on IV vancomycin. -2D echo without obvious vegetation, TEE was deferred and plan is to treat empirically for endocarditis -Patient was also found to be 26 weeks Pregnancy.   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)   Tricuspid regurgitation   Preexisting hypertension complicating pregnancy, antepartum   Dental caries   Endocarditis of tricuspid valve   History of VBAC   Lumbar back pain   Acute on chronic congestive heart failure (HCC)   Cellulitis of right lower extremity   Septic embolism (HCC)   Syphilis   [redacted] weeks gestation of pregnancy   Flank pain   Drug use affecting pregnancy in second trimester   Maternal endocarditis affecting pregnancy in second trimester, antepartum   Methadone maintenance treatment affecting pregnancy ([place])   Heart  disease in mother complicating pregnancy  1-MRSA bacteremia, presumed endocarditis:  -ID recommend for patient to be treated for presume Endocarditis.  -Continue with IV vancomycin, ID following. -2D echo without obvious vegetation, TEE defer, plan to empirically treat as endocarditis. -Repeated blood cultures: So far negative. -Patient will  need 6 weeks of IV antibiotics inpatient. -Continue IV antibiotics.   2-Acute on chronic Right side Heart Failure -Severe TR -Severe pulmonary hypertension -2D echo noted decreased RV systolic function, elevated PA pressure 92 severe TR. -Appreciate heart failure team management -Transition to oral lasix 3-12.  Appreciate cardiology assistance.  --13 L. Stable.   3-Chronic Anemia: -Anemia panel: B12 445, folate 7.4, iron 32, ferritin 111. -Hemoglobin baseline 8.5-9.5 -Hb stable. At 10--9.8  4-IV heroin and fentanyl abuse Chronic pain On methadone 30 mg 3 times daily IV Dilaudid. Will start decreasing dose of dilaudid 0.5-1 mg PRN.  Discussed with Dr Dione Plover, plan to change methadone to 100 mg daily starting tomorrow. Patient was able to tolerate previously 100 mg of methadone daily. We can do continuous pulse oxygen.   5-27 6 days weeks pregnancy: -OB service following, getting daily fetal heart assessment. -Counseling provided to patient regarding pain medication and effect to fetus.   6-Pneumonia: Possible septic pulmonary infarcts. 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 -Given penicillin 2.4 MU intramuscular  8-back pain: Unable to tolerate MRI, discontinue with ID 10-Hyponatremia: monitor on Lasix 11-Jaw pain, ear pain. Started on Augmentin. Order Mouth wash. Orthopantogram : multiples caries.  Dentist on call recommended follow up in his  clinic. I contacted Dental clinic [facility], awaiting call back.   Estimated body mass index is 27 kg/m as calculated from  the following:   Height as of this encounter: 5' 7.5" (1.715 m).   Weight as of this encounter: 79.4 kg.   DVT prophylaxis: lovenox Code Status: Full cdoe Family Communication: care discussed with patient/ Disposition Plan:  Status is: Inpatient  Remains inpatient appropriate because:IV treatments appropriate due to intensity of illness or inability to take PO   Dispo: The patient is from: Home              Anticipated d/c is to: Home              Patient currently is not medically stable to d/c.   Difficult to place patient No        Consultants:   ID  GYN  Cardiology   Procedures:   None  Antimicrobials:  IV Vancomycin   Subjective: She denies worsening dyspnea.  Complaints of jaw pain.   Objective: Vitals:   07/24/20 0855 07/24/20 2033 07/25/20 0521 07/25/20 0727  BP: 116/79 118/75 111/81 112/74  Pulse: (!) 104 94 94 89  Resp: 18 16 20 20   Temp: 98 F (36.7 C) 98 F (36.7 C) 97.7 F (36.5 C) 97.9 F (36.6 C)  TempSrc: Oral Oral Oral Oral  SpO2: 95% 96% 99% 96%  Weight:   79.4 kg   Height:        Intake/Output Summary (Last 24 hours) at 07/25/2020 1443 Last data filed at 07/25/2020 0001 Gross per 24 hour  Intake 150 ml  Output --  Net 150 ml   Filed Weights   07/23/20 0457 07/24/20 0327 07/25/20 0521  Weight: 83 kg 79.4 kg 79.4 kg    Examination:  General exam: Chronic ill appearing,  Respiratory system:  CTA Cardiovascular system: S 1, S 2 RRR Gastrointestinal system: BS present, soft, nt GU:  Gravid uterus  Central nervous system: ALert Extremities: Trace edema    Data Reviewed: I have personally reviewed following labs and imaging studies  CBC: Recent Labs  Lab 07/20/20 0500 07/21/20 1004 07/22/20 0505 07/23/20 0200 07/24/20 0348  WBC 17.3* 17.5* 16.6* 15.8* 12.4*  HGB 9.4* 10.9* 10.7* 9.8* 9.6*  HCT 29.2* 33.8* 33.0* 30.3* 28.6*  MCV 91.5 91.1 90.9 90.7 89.7  PLT 426* 483* 466* 437* 161*   Basic Metabolic  Panel: Recent Labs  Lab 07/20/20 0500 07/21/20 1004 07/22/20 0505 07/22/20 0939 07/23/20 0200 07/24/20 0348 07/25/20 0939  NA 129* 130* 131*  --  129* 130* 133*  K 4.5 4.7 4.5  --  4.2 3.7 4.4  CL 90* 92* 92*  --  94* 97* 95*  CO2 27 28 30   --  26 24 29   GLUCOSE 85 90 86  --  76 83 93  BUN 11 18 20   --  17 13 11   CREATININE 0.46 0.53 0.59  --  0.52 0.48 0.50  CALCIUM 9.2 9.8 9.6  --  9.4 9.1 9.2  MG 1.8  --   --  1.9  --   --   --    GFR: Estimated Creatinine Clearance: 107.5 mL/min (by C-G formula based on SCr of 0.5 mg/dL). Liver Function Tests: No results for input(s): AST, ALT, ALKPHOS, BILITOT, PROT, ALBUMIN in the last 168 hours. No results for input(s): LIPASE, AMYLASE in the last 168 hours. No results for input(s): AMMONIA in the last 168 hours. Coagulation Profile: No results for  input(s): INR, PROTIME in the last 168 hours. Cardiac Enzymes: No results for input(s): CKTOTAL, CKMB, CKMBINDEX, TROPONINI in the last 168 hours. BNP (last 3 results) No results for input(s): PROBNP in the last 8760 hours. HbA1C: No results for input(s): HGBA1C in the last 72 hours. CBG: Recent Labs  Lab 07/21/20 1210 07/25/20 0724  GLUCAP 121* 93   Lipid Profile: No results for input(s): CHOL, HDL, LDLCALC, TRIG, CHOLHDL, LDLDIRECT in the last 72 hours. Thyroid Function Tests: No results for input(s): TSH, T4TOTAL, FREET4, T3FREE, THYROIDAB in the last 72 hours. Anemia Panel: No results for input(s): VITAMINB12, FOLATE, FERRITIN, TIBC, IRON, RETICCTPCT in the last 72 hours. Sepsis Labs: No results for input(s): PROCALCITON, LATICACIDVEN in the last 168 hours.  Recent Results (from the past 240 hour(s))  Culture, blood (routine x 2)     Status: None   Collection Time: 07/15/20  6:49 PM   Specimen: BLOOD  Result Value Ref Range Status   Specimen Description BLOOD RIGHT ANTECUBITAL  Final   Special Requests   Final    BOTTLES DRAWN AEROBIC AND ANAEROBIC BACTEROIDES CACCAE    Culture   Final    NO GROWTH 5 DAYS Performed at [facility], [address], [place], [place] [address]    Report Status 07/20/2020 FINAL  Final  Culture, blood (routine x 2)     Status: None   Collection Time: 07/15/20  6:50 PM   Specimen: BLOOD  Result Value Ref Range Status   Specimen Description BLOOD BLOOD RIGHT FOREARM  Final   Special Requests   Final    BOTTLES DRAWN AEROBIC AND ANAEROBIC Blood Culture adequate volume   Culture   Final    NO GROWTH 5 DAYS Performed at [facility], [place] [address], [place], [place] [address]    Report Status 07/20/2020 FINAL  Final         Radiology Studies: No results found.      Scheduled Meds: . sodium chloride   Intravenous Once  . acetaminophen  650 mg Oral Q6H  . amoxicillin-clavulanate  1 tablet Oral Q12H  . Chlorhexidine Gluconate Cloth  6 each Topical Daily  . enoxaparin (LOVENOX) injection  40 mg Subcutaneous Q24H  . furosemide  40 mg Oral Daily  . methadone  30 mg Oral TID PC  . polyethylene glycol  17 g Oral BID  . prenatal vitamin w/FE, FA  1 tablet Oral Q1200  . senna-docusate  1 tablet Oral BID  . sodium chloride flush  10-40 mL Intracatheter Q12H   Continuous Infusions: . vancomycin 750 mg (07/25/20 0855)     LOS: 12 days    Time spent: 35 minutes    Keyerra Lamere A Lauranne Beyersdorf, MD [facility]   If 7PM-7AM, please contact night-coverage [email or web address]  07/25/2020, 2:43 PM

## 2020-07-25 NOTE — Progress Notes (Addendum)
Advanced Heart Failure Rounding Note   Subjective:    Still complaining of dental pain otherwise feels her breathing has improved and she can now finally sleep, still very tired.  Weight and cr stable.  She has lost about 25lbs since admission.    Objective:   Weight Range:  Vital Signs:   Temp:  [97.7 F (36.5 C)-98 F (36.7 C)] 97.9 F (36.6 C) (03/14 0727) Pulse Rate:  [89-94] 89 (03/14 0727) Resp:  [16-20] 20 (03/14 0727) BP: (111-118)/(74-81) 112/74 (03/14 0727) SpO2:  [96 %-99 %] 96 % (03/14 0727) Weight:  [79.4 kg] 79.4 kg (03/14 0521) Last BM Date: 07/25/20  Weight change: Filed Weights   07/23/20 0457 07/24/20 0327 07/25/20 0521  Weight: 83 kg 79.4 kg 79.4 kg    Intake/Output:   Intake/Output Summary (Last 24 hours) at 07/25/2020 1111 Last data filed at 07/25/2020 0001 Gross per 24 hour  Intake 150 ml  Output --  Net 150 ml     PHYSICAL EXAM: Cardiac: JVD flat, normal rate and rhythm, clear s1 and s2, 2/6 TR, no rubs or gallops, no LE edema Pulmonary: CTAB, not in distress Abdominal: non distended abdomen, soft and nontender Psych: Alert, conversant, in good spirits   Telemetry: NSR 80's-90's personally reviewed.   Labs: Basic Metabolic Panel: Recent Labs  Lab 07/20/20 0500 07/21/20 1004 07/22/20 0505 07/22/20 0939 07/23/20 0200 07/24/20 0348 07/25/20 0939  NA 129* 130* 131*  --  129* 130* 133*  K 4.5 4.7 4.5  --  4.2 3.7 4.4  CL 90* 92* 92*  --  94* 97* 95*  CO2 27 28 30   --  26 24 29   GLUCOSE 85 90 86  --  76 83 93  BUN 11 18 20   --  17 13 11   CREATININE 0.46 0.53 0.59  --  0.52 0.48 0.50  CALCIUM 9.2 9.8 9.6  --  9.4 9.1 9.2  MG 1.8  --   --  1.9  --   --   --     Liver Function Tests: No results for input(s): AST, ALT, ALKPHOS, BILITOT, PROT, ALBUMIN in the last 168 hours. No results for input(s): LIPASE, AMYLASE in the last 168 hours. No results for input(s): AMMONIA in the last 168 hours.  CBC: Recent Labs  Lab  07/20/20 0500 07/21/20 1004 07/22/20 0505 07/23/20 0200 07/24/20 0348  WBC 17.3* 17.5* 16.6* 15.8* 12.4*  HGB 9.4* 10.9* 10.7* 9.8* 9.6*  HCT 29.2* 33.8* 33.0* 30.3* 28.6*  MCV 91.5 91.1 90.9 90.7 89.7  PLT 426* 483* 466* 437* 442*    Cardiac Enzymes: No results for input(s): CKTOTAL, CKMB, CKMBINDEX, TROPONINI in the last 168 hours.  BNP: BNP (last 3 results) Recent Labs    07/13/20 1219  BNP 157.1*    ProBNP (last 3 results) No results for input(s): PROBNP in the last 8760 hours.    Other results:  Imaging: No results found.   Medications:     Scheduled Medications:  sodium chloride   Intravenous Once   acetaminophen  650 mg Oral Q6H   amoxicillin-clavulanate  1 tablet Oral Q12H   Chlorhexidine Gluconate Cloth  6 each Topical Daily   enoxaparin (LOVENOX) injection  60 mg Subcutaneous Q24H   furosemide  40 mg Oral Daily   methadone  30 mg Oral TID PC   polyethylene glycol  17 g Oral BID   prenatal vitamin w/FE, FA  1 tablet Oral Q1200   senna-docusate  1  tablet Oral BID   sodium chloride flush  10-40 mL Intracatheter Q12H    Infusions:  vancomycin 750 mg (07/25/20 0855)    PRN Medications: HYDROmorphone (DILAUDID) injection, magic mouthwash w/lidocaine, prochlorperazine, sodium chloride flush, sodium chloride flush   Assessment/Plan:   1. MRSA Sepsis  - bcx 2/2 + for MRSA - clinically improved with IV abx and volume resuscitation.  - ID has seen and adjusting abx. Will need 6 weeks.  - Bld Cx 3/4 - No growth.  - no obvious vegetation on valve on TTE but assume she has recurrent endocarditis and would treat for 6 weeks with IV abx. D/w ID and both agree that TEE would not change management at this point as valve known to be heavily damaged from previous episodes and currently not candidate for operative repair.  -Plan to keep in the hospital until IV antibiotics completed. 08/19/20  2. Acute on Chronic Systolic CHF with Mostly Right  Sided CHF (Likely Secondary to Severe TR) - Echo 07/13/20 showed normalization of LV function  LVEF of 55% with normal wall motion. RV severely enlarged with moderately reduced systolic function and severely elevated PASP of 92.45mHg. Severe TR noted but no vegetation visualized.  - Volume status stable. Lasix switched to 465mdaily over the weekend which I agree with  - Renal function stable.   3. 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 He was seen by Dr. GeServando Snaren 05/2019 for possible surgical repair. However, patient was lost to follow-up and unfortunately started using drugs again. - Echo this admission shows severe TR but no signs of vegetation. -Plan to keep in the hospital until IV antibiotics completed. 08/19/20  4. 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 Polysubstance Abuse -Had using IV heroine + fentanyl - On methadone.   7 Hyponatremia - likely due to hypervolemia - Check BMET    [place] of [place]: [place]  07/25/2020, 11:11 AM  Advanced Heart Failure Team Pager [phone]-F; [place] Please contact [email or web address] for night-coverage after hours (4p -7a ) and weekends on [email or web address]  Patient seen and examined with the above-signed Advanced Practice Provider and/or Housestaff. I personally reviewed laboratory data, imaging studies and relevant notes. I independently examined the patient and formulated the important aspects of the plan. I have edited the note to reflect any of my changes or salient points. I have personally discussed the plan with the patient and/or family.  Breathing is better. Denies orthopnea or PND. Weight stable  General:  Sitting up in bed No resp  difficulty HEENT: normal poor dentition  Neck: supple. no JVD. Carotids 2+ bilat; no bruits. No lymphadenopathy or thryomegaly appreciated. Cor: PMI nondisplaced. Regular rate & rhythm. No rubs, gallops or murmurs. Lungs: clear Abdomen: soft, nontender, gravid No hepatosplenomegaly. No bruits or masses. Good bowel sounds. Extremities: no cyanosis, clubbing, rash, edema Neuro: alert & orientedx3, cranial nerves grossly intact. moves all 4 extremities w/o difficulty. Affect pleasant  Volume status looks good. Continue daily lasix. May be able to cut back soon. Plans to stay in hospital until IV abx complete - another 3.5 weeks. That will put her at 31-[redacted] weeks gestation - I  do not think we can safely discharge her at that point without a high-risk of relapse and danger to her fetus. Will need to d/w OB team to come up with delivery timeline.   Glori Bickers, MD  3:08 PM

## 2020-07-25 NOTE — Consult Note (Signed)
Patient seen to evaluate her opioid use disorder in pregnancy and help determine treatment plan for remainder of her pregnancy once her antibiotic treatment for endocarditis has been completed in several weeks time.   Patient reports that she relapsed in the setting of significant stress surrounding CPS investigations. Does not have custody of her youngest child at present.  Prior to her relapse she was on methadone around 100 mg daily. Reports prior to admission she was using around 3 grams of fentanyl daily, and occasionally smoking crack. Denies knowingly using any other substances though suspects at time she was getting batches of fentanyl that were laced with methamphetamines. Paid for this with "help from friends" and her partner. Prior to admission was living in a hotel paid for by her partner. Prior to her relapse however she was living at [facility] (sober living facility for pregnant women and postpartum women/their children) but her relationship with the staff there is currently not good and she is not sure if she can go back. She is not sure if wishes to continue on methadone postpartum due to burdens it places on her for daily visits but does not think she can take suboxone again because she thinks she had an allergic reaction in the past. Reports that she was at a [employer] and passed out, at the time she was suspected of using illicit substances but reports drug testing was negative and ultimately told she had a possible allergic reaction to the suboxone.  Assessment & Recommendations: - Patient presents with severe opioid use disorder in setting of pregnancy as evidenced by acute CHF from recurrent tricuspid endocarditis and likely septic pulmonary emboli. She is at high risk for morbidity and mortality without a comprehensive plan to address her medical, social, and psychological needs - Continue methadone until delivery, transitioning between medications is not recommended in pregnancy -  If patient desires transition to suboxone after delivery recommend inpatient cross titration due to complexity of this transition as well as reported hx of possible (though doubtful) allergic reaction - Recommend consulting transitions of care team for assistance with coordinating enrollment at outpatient methadone clinic as well as assistance with finding housing - I have asked patient to reach out to [facility] and any other resources she has to help create a safe discharge plan - If we are unable to create a safe discharge plan once her antibiotic course has been completed for endocarditis, recommend she remain inpatient at [facility] until delivery due to high risk of relapse and death - transition methadone from TID dosing to once daily which will better reflect her eventual outpatient dosing should she continue on this medication postpartum - recommend that daily fetal monitoring be performed prior to once daily methadone dosing to more accurately assess fetal status - patient reports incomplete relief from withdrawal symptoms at her current dose which is not unexpected given use prior to admission as well as increased metabolism of methadone with pregnancy, increase methadone dose from 90 --> 100 mg - Patient report of methadone efficacy (or lack thereof) is sufficient to guide future dose increases. Wait a minimum of 72 hours before increasing the dose of methadone again to allow the new dose to achieve steady state. If patient reports ongoing symptoms dose can be increased by 5-10 mg every 72 hours - Methadone analgesia lasts for approximately 8 hours after the initial dose. If patient has increased pain with once daily dosing would be reasonable to consider BID dosing or  increasing alternative pain medicines - given history of IV substance use patient is at high risk for hepatitides if she relapses in the future. Hepatitis C viral load and Hep B S Antigen negative on admission, recommend checking  serologies for immunity to Hepatitis A and B and immunizing accordingly - Recommend screening for gonorrhea, chlamydia, and trichomonas. This can be done on a dirty catch urine or with vaginal swab - Recommend that all clinicians involved in the patient's care strive to use language that reduces stigma and bias both inside and outside the patient's room  I am happy to to assist further with management of [place] opioid use disorder on a non-urgent basis by staff message or secure chat. Please direct all urgent OB questions to the Gyn Physician on Call [phone]).   Clarnce Flock, MD/MPH Attending Family Medicine Physician, [facility] for [facility], [place]

## 2020-07-25 NOTE — Plan of Care (Signed)
  Problem: Clinical Measurements: Goal: Ability to maintain clinical measurements within normal limits will improve Outcome: Progressing Goal: Respiratory complications will improve Outcome: Progressing   Problem: Activity: Goal: Risk for activity intolerance will decrease Outcome: Progressing   

## 2020-07-25 NOTE — Progress Notes (Signed)
FACULTY PRACTICE ANTEPARTUM PROGRESS NOTE  Kelly [name] is a 35 y.o. [phone] at 42w6dwho is admitted for MRSA bacteremia, infective endocarditis, severe tricuspid regurgitation, septic emboli with h/o discitis/osteomyelitis/epidural abscess, syphilis, chronic HTN, and acute on chronic right side heart failure in the setting of longstanding h/o IV heroin and fentanyl abuse.  Estimated Date of Delivery: 10/18/20 Fetal presentation is breech per 07/20/20 ultrasound  Length of Stay:  12 Days. Admitted 07/13/2020  Subjective: Patient reports normal fetal movement.  She denies uterine contractions, denies bleeding and leaking of fluid per vagina. She reports she is feeling better than she was previously.  Vitals:  Blood pressure 112/74, pulse 89, temperature 97.9 F (36.6 C), temperature source Oral, resp. rate 20, height 5' 7.5" (1.715 m), weight 79.4 kg, SpO2 96 %, not currently breastfeeding. Physical Examination: CONSTITUTIONAL: Well-developed, well-nourished female in no acute distress.  HENT:  Normocephalic, atraumatic, External right and left ear normal. Oropharynx is clear and moist EYES: Conjunctivae and EOM are normal. Pupils are equal, round, and reactive to light. No scleral icterus.  NECK: Normal range of motion, supple, no masses. SKIN: Skin is warm and dry. No rash noted. Not diaphoretic. No erythema. No pallor. [place] Alert and oriented to person, place, and time. Normal reflexes, muscle tone coordination. No cranial nerve deficit noted. PSYCHIATRIC: Normal mood and affect. Normal behavior. Normal judgment and thought content. CARDIOVASCULAR: Normal heart rate noted RESPIRATORY: Effort normal, no problems with respiration noted MUSCULOSKELETAL: Normal range of motion. No edema and no tenderness. ABDOMEN: Soft, nontender, nondistended, gravid. CERVIX: deferred  Fetal monitoring: FHR: pending for today  Results for orders placed or performed during the hospital encounter of  07/13/20 (from the past 48 hour(s))  Basic metabolic panel     Status: Abnormal   Collection Time: 07/24/20  3:48 AM  Result Value Ref Range   Sodium 130 (L) 135 - 145 mmol/L   Potassium 3.7 3.5 - 5.1 mmol/L   Chloride 97 (L) 98 - 111 mmol/L   CO2 24 22 - 32 mmol/L   Glucose, Bld 83 70 - 99 mg/dL    Comment: Glucose reference range applies only to samples taken after fasting for at least 8 hours.   BUN 13 6 - 20 mg/dL   Creatinine, Ser 0.48 0.44 - 1.00 mg/dL   Calcium 9.1 8.9 - 10.3 mg/dL   GFR, Estimated >60 >60 mL/min    Comment: (NOTE) Calculated using the CKD-EPI Creatinine Equation (2021)    Anion gap 9 5 - 15    Comment: Performed at [facility] [id number] CBC     Status: Abnormal   Collection Time: 07/24/20  3:48 AM  Result Value Ref Range   WBC 12.4 (H) 4.0 - 10.5 K/uL   RBC 3.19 (L) 3.87 - 5.11 MIL/uL   Hemoglobin 9.6 (L) 12.0 - 15.0 g/dL   HCT 28.6 (L) 36.0 - 46.0 %   MCV 89.7 80.0 - 100.0 fL   MCH 30.1 26.0 - 34.0 pg   MCHC 33.6 30.0 - 36.0 g/dL   RDW 13.8 11.5 - 15.5 %   Platelets 442 (H) 150 - 400 K/uL   nRBC 0.0 0.0 - 0.2 %    Comment: [facility]formed [facility] [address], [id number] Glucose, capillary     Status: None   Collection Time: 07/25/20  7:24 AM  Result Value Ref Range   Glucose-Capillary 93 70 -  99 mg/dL    Comment: Glucose reference range applies only to samples taken after fasting for at least 8 hours.  Basic metabolic panel     Status: Abnormal   Collection Time: 07/25/20  9:39 AM  Result Value Ref Range   Sodium 133 (L) 135 - 145 mmol/L   Potassium 4.4 3.5 - 5.1 mmol/L   Chloride 95 (L) 98 - 111 mmol/L   CO2 29 22 - 32 mmol/L   Glucose, Bld 93 70 - 99 mg/dL    Comment: Glucose reference range applies only to samples taken after fasting for at least 8 hours.   BUN 11 6 - 20 mg/dL   Creatinine, Ser 0.50 0.44 - 1.00 mg/dL   Calcium 9.2 8.9 - 10.3 mg/dL   GFR, Estimated >60  >60 mL/min    Comment: (NOTE) Calculated using the CKD-EPI Creatinine Equation (2021)    Anion gap 9 5 - 15    Comment: Performed at [facility] [address], [place], [place] [address]    I have reviewed the patient's current medications.  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)   Tricuspid regurgitation   Preexisting hypertension complicating pregnancy, antepartum   Dental caries   Endocarditis of tricuspid valve   History of VBAC   Lumbar back pain   Acute on chronic congestive heart failure (HCC)   Cellulitis of right lower extremity   Septic embolism (HCC)   Syphilis   [redacted] weeks gestation of pregnancy   Flank pain   Drug use affecting pregnancy in second trimester   Maternal endocarditis affecting pregnancy in second trimester, antepartum   Methadone maintenance treatment affecting pregnancy ([place])   Heart disease in mother complicating pregnancy   PLAN: - Management per IM, ID and Cardiology teams as current hospitalization is not related to pregnancy  - Appreciate the multidisciplinary care for this complex patient. - Will defer decisions about appropriate analgesia to her primary team.   - Continue antibiotic therapies as per ID. - From an OB standpoint, fetal well being reassuring. Reassuring FHT thus far, tracing pending for today - Start weekly BPP by MFM, ordered to start tomorrow  - Reviewed risks/benefits of TOLAC versus RCS in detail. Patient counseled regarding potential vaginal delivery, chance of success, future implications, possible uterine rupture and need for urgent/emergent repeat cesarean. Counseled regarding potential need for repeat c-section for  reasons unrelated to first c-section. Counseled regarding scheduled repeat cesarean including risks of bleeding, infection, damage to surrounding tissue, abnormal placentation, implications for future pregnancies. All questions answered.  Patient desires trial of labor after cesarean. - We will continue to follow patient closely. Daily FHR assessment will be done by our Rapid Response OB Nurse as directed, and weekly ultrasounds by our MFM team.    Please call [phone] [facility] OB/[facility]N Consult Attending Monday-Friday 8am - 5pm) or [phone] [facility] OB/GYN Attending On Call all day, every day) for any obstetric concerns at any time.   Total consultation time including face-to-face time with patient (>50% of time), reviewing chart and documentation: 25 minutes   Continue routine antenatal care.   Feliz Beam, MD, [place] for [employer] (Faculty Practice)  07/25/2020 11:53 AM

## 2020-07-25 NOTE — Progress Notes (Signed)
Subjective:  Back pain better but still with jaw pain  Antibiotics:  Anti-infectives (From admission, onward)   Start     Dose/Rate Route Frequency Ordered Stop   07/22/20 1045  amoxicillin-clavulanate (AUGMENTIN) 875-125 MG per tablet 1 tablet        1 tablet Oral Every 12 hours 07/22/20 0952     07/18/20 0830  vancomycin (VANCOCIN) IVPB 750 mg/150 ml premix        750 mg 150 mL/hr over 60 Minutes Intravenous Every 8 hours 07/18/20 0750     07/16/20 0400  Vancomycin (VANCOCIN) 1,250 mg in sodium chloride 0.9 % 250 mL IVPB  Status:  Discontinued        1,250 mg 166.7 mL/hr over 90 Minutes Intravenous Every 8 hours 07/15/20 1944 07/18/20 0749   07/15/20 1715  penicillin g benzathine (BICILLIN LA) 1200000 UNIT/2ML injection 2.4 Million Units        2.4 Million Units Intramuscular  Once 07/15/20 1621 07/15/20 1747   07/14/20 0300  vancomycin (VANCOCIN) IVPB 1000 mg/200 mL premix        1,000 mg 200 mL/hr over 60 Minutes Intravenous Every 8 hours 07/13/20 1806 07/15/20 2059   07/13/20 1730  Vancomycin (VANCOCIN) 1,500 mg in sodium chloride 0.9 % 500 mL IVPB        1,500 mg 250 mL/hr over 120 Minutes Intravenous  Once 07/13/20 1709 07/13/20 2116   07/13/20 1730  ceFEPIme (MAXIPIME) 2 g in sodium chloride 0.9 % 100 mL IVPB  Status:  Discontinued        2 g 200 mL/hr over 30 Minutes Intravenous Every 8 hours 07/13/20 1709 07/14/20 1050   07/13/20 1315  cefTRIAXone (ROCEPHIN) 1 g in sodium chloride 0.9 % 100 mL IVPB        1 g 200 mL/hr over 30 Minutes Intravenous  Once 07/13/20 1306 07/13/20 1538   07/13/20 1315  azithromycin (ZITHROMAX) 500 mg in sodium chloride 0.9 % 250 mL IVPB        500 mg 250 mL/hr over 60 Minutes Intravenous  Once 07/13/20 1306 07/13/20 1644      Medications: Scheduled Meds: . sodium chloride   Intravenous Once  . acetaminophen  650 mg Oral Q6H  . amoxicillin-clavulanate  1 tablet Oral Q12H  . Chlorhexidine Gluconate Cloth  6 each Topical Daily   . enoxaparin (LOVENOX) injection  60 mg Subcutaneous Q24H  . furosemide  40 mg Oral Daily  . methadone  30 mg Oral TID PC  . polyethylene glycol  17 g Oral BID  . prenatal vitamin w/FE, FA  1 tablet Oral Q1200  . senna-docusate  1 tablet Oral BID  . sodium chloride flush  10-40 mL Intracatheter Q12H   Continuous Infusions: . vancomycin 750 mg (07/25/20 0855)   PRN Meds:.HYDROmorphone (DILAUDID) injection, magic mouthwash w/lidocaine, prochlorperazine, sodium chloride flush, sodium chloride flush    Objective: Weight change: 0 kg  Intake/Output Summary (Last 24 hours) at 07/25/2020 1303 Last data filed at 07/25/2020 0001 Gross per 24 hour  Intake 150 ml  Output --  Net 150 ml   Blood pressure 112/74, pulse 89, temperature 97.9 F (36.6 C), temperature source Oral, resp. rate 20, height 5' 7.5" (1.715 m), weight 79.4 kg, SpO2 96 %, not currently breastfeeding. Temp:  [97.7 F (36.5 C)-98 F (36.7 C)] 97.9 F (36.6 C) (03/14 0727) Pulse Rate:  [89-94] 89 (03/14 0727) Resp:  [16-20] 20 (03/14 0727) BP: (111-118)/(74-81) 112/74 (  03/14 0727) SpO2:  [96 %-99 %] 96 % (03/14 0727) Weight:  [79.4 kg] 79.4 kg (03/14 0521)  Physical Exam: Physical Exam Constitutional:      General: She is not in acute distress.    Appearance: She is well-developed. She is not diaphoretic.  HENT:     Head: Normocephalic and atraumatic.     Right Ear: External ear normal.     Left Ear: External ear normal.     Mouth/Throat:     Dentition: Abnormal dentition. Gingival swelling present.     Pharynx: No oropharyngeal exudate.  Eyes:     General: No scleral icterus.    Conjunctiva/sclera: Conjunctivae normal.     Pupils: Pupils are equal, round, and reactive to light.  Cardiovascular:     Rate and Rhythm: Regular rhythm. Tachycardia present.     Heart sounds: No murmur heard.   Pulmonary:     Effort: Pulmonary effort is normal. No respiratory distress.     Breath sounds: No wheezing.   Abdominal:     General: There is no distension.     Palpations: Abdomen is soft.  Musculoskeletal:        General: No tenderness. Normal range of motion.  Lymphadenopathy:     Cervical: No cervical adenopathy.  Skin:    General: Skin is warm and dry.     Coloration: Skin is not pale.     Findings: No erythema or rash.  Neurological:     Mental Status: She is alert and oriented to person, place, and time.     Motor: No abnormal muscle tone.     Coordination: Coordination normal.  Psychiatric:        Attention and Perception: Attention normal.        Mood and Affect: Mood is anxious.        Speech: Speech normal.        Behavior: Behavior normal.        Thought Content: Thought content normal.        Cognition and Memory: Cognition normal.        Judgment: Judgment normal.      CBC:    BMET Recent Labs    07/24/20 0348 07/25/20 0939  NA 130* 133*  K 3.7 4.4  CL 97* 95*  CO2 24 29  GLUCOSE 83 93  BUN 13 11  CREATININE 0.48 0.50  CALCIUM 9.1 9.2     Liver Panel  No results for input(s): PROT, ALBUMIN, AST, ALT, ALKPHOS, BILITOT, BILIDIR, IBILI in the last 72 hours.     Sedimentation Rate No results for input(s): ESRSEDRATE in the last 72 hours. C-Reactive Protein No results for input(s): CRP in the last 72 hours.  Micro Results: Recent Results (from the past 720 hour(s))  Resp Panel by RT-PCR (Flu A&B, Covid) Nasopharyngeal Swab     Status: None   Collection Time: 07/13/20 11:21 AM   Specimen: Nasopharyngeal Swab; Nasopharyngeal(NP) swabs in vial transport medium  Result Value Ref Range Status   SARS Coronavirus 2 by RT PCR NEGATIVE NEGATIVE Final    Comment: (NOTE) SARS-CoV-2 target nucleic acids are NOT DETECTED.  The SARS-CoV-2 RNA is generally detectable in upper respiratory specimens during the acute phase of infection. The lowest concentration of SARS-CoV-2 viral copies this assay can detect is 138 copies/mL. A negative result does not  preclude SARS-Cov-2 infection and should not be used as the sole basis for treatment or other patient management decisions. A negative result may occur  with  improper specimen collection/handling, submission of specimen other than nasopharyngeal swab, presence of viral mutation(s) within the areas targeted by this assay, and inadequate number of viral copies(<138 copies/mL). A negative result must be combined with clinical observations, patient history, and epidemiological information. The expected result is Negative.  Fact Sheet for Patients:  EntrepreneurPulse.com.au  Fact Sheet for Healthcare Providers:  IncredibleEmployment.be  This test is no t yet approved or cleared by the [place] FDA and  has been authorized for detection and/or diagnosis of SARS-CoV-2 by FDA under an Emergency Use Authorization (EUA). This EUA will remain  in effect (meaning this test can be used) for the duration of the COVID-19 declaration under Section 564(b)(1) of the Act, 21 U.S.C.section 360bbb-3(b)(1), unless the authorization is terminated  or revoked sooner.       Influenza A by PCR NEGATIVE NEGATIVE Final   Influenza B by PCR NEGATIVE NEGATIVE Final    Comment: (NOTE) The Xpert Xpress SARS-CoV-2/FLU/RSV plus assay is intended as an aid in the diagnosis of influenza from Nasopharyngeal swab specimens and should not be used as a sole basis for treatment. Nasal washings and aspirates are unacceptable for Xpert Xpress SARS-CoV-2/FLU/RSV testing.  Fact Sheet for Patients: EntrepreneurPulse.com.au  Fact Sheet for Healthcare Providers: IncredibleEmployment.be  This test is not yet approved or cleared by the [place] FDA and has been authorized for detection and/or diagnosis of SARS-CoV-2 by FDA under an Emergency Use Authorization (EUA). This EUA will remain in effect (meaning this test can be used) for the  duration of the COVID-19 declaration under Section 564(b)(1) of the Act, 21 U.S.C. section 360bbb-3(b)(1), unless the authorization is terminated or revoked.  Performed at [facility], [place] [address] [place], [place] [address]   Culture, blood (routine x 2)     Status: Abnormal   Collection Time: 07/13/20 12:20 PM   Specimen: BLOOD  Result Value Ref Range Status   Specimen Description BLOOD SITE NOT SPECIFIED  Final   Special Requests   Final    BOTTLES DRAWN AEROBIC AND ANAEROBIC Blood Culture adequate volume   Culture  Setup Time   Final    GRAM POSITIVE COCCI IN CHAINS IN BOTH AEROBIC AND ANAEROBIC BOTTLES Organism ID to follow CRITICAL RESULT CALLED TO, READ BACK BY AND VERIFIED WITH: Performed at [facility], [place] [address], [place], [place] [address]    Culture METHICILLIN RESISTANT STAPHYLOCOCCUS AUREUS (A)  Final   Report Status 07/16/2020 FINAL  Final   Organism ID, Bacteria METHICILLIN RESISTANT STAPHYLOCOCCUS AUREUS  Final      Susceptibility   Methicillin resistant staphylococcus aureus - MIC*    CIPROFLOXACIN <=0.5 SENSITIVE Sensitive     ERYTHROMYCIN >=8 RESISTANT Resistant     GENTAMICIN <=0.5 SENSITIVE Sensitive     OXACILLIN >=4 RESISTANT Resistant     TETRACYCLINE <=1 SENSITIVE Sensitive     VANCOMYCIN <=0.5 SENSITIVE Sensitive     TRIMETH/SULFA <=10 SENSITIVE Sensitive     CLINDAMYCIN <=0.25 SENSITIVE Sensitive     RIFAMPIN <=0.5 SENSITIVE Sensitive     Inducible Clindamycin NEGATIVE Sensitive     * METHICILLIN RESISTANT STAPHYLOCOCCUS AUREUS  Blood Culture ID Panel (Reflexed)     Status: Abnormal   Collection Time: 07/13/20 12:20 PM  Result Value Ref Range Status   Enterococcus faecalis NOT DETECTED NOT DETECTED Final   Enterococcus Faecium NOT DETECTED NOT DETECTED Final   Listeria monocytogenes NOT DETECTED NOT DETECTED Final   Staphylococcus species DETECTED (A)  NOT DETECTED Final    Comment: CRITICAL RESULT CALLED TO, READ BACK BY  AND VERIFIED WITH: PHARMD AMEND CAREN BY MESSAN H. AT 0120 ON 07/14/2020    Staphylococcus aureus (BCID) DETECTED (A) NOT DETECTED Final    Comment: Methicillin (oxacillin)-resistant Staphylococcus aureus (MRSA). MRSA is predictably resistant to beta-lactam antibiotics (except ceftaroline). Preferred therapy is vancomycin unless clinically contraindicated. Patient requires contact precautions if  hospitalized. CRITICAL RESULT CALLED TO, READ BACK BY AND VERIFIED WITH: PHARMD AMEND CAREN BY MESSAN H. AT 0120 ON 07/14/2020    Staphylococcus epidermidis NOT DETECTED NOT DETECTED Final   Staphylococcus lugdunensis NOT DETECTED NOT DETECTED Final   Streptococcus species NOT DETECTED NOT DETECTED Final   Streptococcus agalactiae NOT DETECTED NOT DETECTED Final   Streptococcus pneumoniae NOT DETECTED NOT DETECTED Final   Streptococcus pyogenes NOT DETECTED NOT DETECTED Final   A.calcoaceticus-baumannii NOT DETECTED NOT DETECTED Final   Bacteroides fragilis NOT DETECTED NOT DETECTED Final   Enterobacterales NOT DETECTED NOT DETECTED Final   Enterobacter cloacae complex NOT DETECTED NOT DETECTED Final   Escherichia coli NOT DETECTED NOT DETECTED Final   Klebsiella aerogenes NOT DETECTED NOT DETECTED Final   Klebsiella oxytoca NOT DETECTED NOT DETECTED Final   Klebsiella pneumoniae NOT DETECTED NOT DETECTED Final   Proteus species NOT DETECTED NOT DETECTED Final   Salmonella species NOT DETECTED NOT DETECTED Final   Serratia marcescens NOT DETECTED NOT DETECTED Final   Haemophilus influenzae NOT DETECTED NOT DETECTED Final   Neisseria meningitidis NOT DETECTED NOT DETECTED Final   Pseudomonas aeruginosa NOT DETECTED NOT DETECTED Final   Stenotrophomonas maltophilia NOT DETECTED NOT DETECTED Final   Candida albicans NOT DETECTED NOT DETECTED Final   Candida auris NOT DETECTED NOT DETECTED Final   Candida glabrata NOT DETECTED NOT DETECTED Final   Candida krusei NOT DETECTED NOT DETECTED Final    Candida parapsilosis NOT DETECTED NOT DETECTED Final   Candida tropicalis NOT DETECTED NOT DETECTED Final   Cryptococcus neoformans/gattii NOT DETECTED NOT DETECTED Final   Meth resistant mecA/C and MREJ DETECTED (A) NOT DETECTED Final    Comment: CRITICAL RESULT CALLED TO, READ BACK BY AND VERIFIED WITH: PHARMD AMEND CAREN BY MESSAN H. AT 0120 ON 07/14/2020 Performed at [facility], [address], [place], [place] [address]   Culture, blood (routine x 2)     Status: Abnormal   Collection Time: 07/13/20 12:21 PM   Specimen: BLOOD  Result Value Ref Range Status   Specimen Description BLOOD SITE NOT SPECIFIED  Final   Special Requests   Final    BOTTLES DRAWN AEROBIC AND ANAEROBIC Blood Culture adequate volume   Culture  Setup Time   Final    GRAM POSITIVE COCCI IN BOTH AEROBIC AND ANAEROBIC BOTTLES CRITICAL VALUE NOTED.  VALUE IS CONSISTENT WITH PREVIOUSLY REPORTED AND CALLED VALUE.    Culture (A)  Final    STAPHYLOCOCCUS AUREUS SUSCEPTIBILITIES PERFORMED ON PREVIOUS CULTURE WITHIN THE LAST 5 DAYS. Performed at [facility], [place] [address], [place], [place] [address]    Report Status 07/16/2020 FINAL  Final  MRSA PCR Screening     Status: Abnormal   Collection Time: 07/13/20  8:37 PM   Specimen: Nasal Mucosa; Nasopharyngeal  Result Value Ref Range Status   MRSA by PCR POSITIVE (A) NEGATIVE Final    Comment:        The GeneXpert MRSA Assay (FDA approved for NASAL specimens only), is one component of a comprehensive MRSA colonization surveillance program.  It is not intended to diagnose MRSA infection nor to guide or monitor treatment for MRSA infections. RESULT CALLED TO, READ BACK BY AND VERIFIED WITH: TOLER,M RN 07/13/2020 AT 2319 SKEEN,P Performed at [facility], [place] [address], [place], [place] [address]   Urine culture     Status: None   Collection Time: 07/13/20 10:10 PM   Specimen: Urine, Clean Catch  Result Value Ref Range Status   Specimen  Description URINE, CLEAN CATCH  Final   Special Requests NONE  Final   Culture   Final    NO GROWTH Performed at [facility], [address], [place], [place] [address]    Report Status 07/15/2020 FINAL  Final  Culture, blood (routine x 2)     Status: None   Collection Time: 07/15/20  6:49 PM   Specimen: BLOOD  Result Value Ref Range Status   Specimen Description BLOOD RIGHT ANTECUBITAL  Final   Special Requests   Final    BOTTLES DRAWN AEROBIC AND ANAEROBIC BACTEROIDES CACCAE   Culture   Final    NO GROWTH 5 DAYS Performed at [facility], [place] [address], [place], [place] [address]    Report Status 07/20/2020 FINAL  Final  Culture, blood (routine x 2)     Status: None   Collection Time: 07/15/20  6:50 PM   Specimen: BLOOD  Result Value Ref Range Status   Specimen Description BLOOD BLOOD RIGHT FOREARM  Final   Special Requests   Final    BOTTLES DRAWN AEROBIC AND ANAEROBIC Blood Culture adequate volume   Culture   Final    NO GROWTH 5 DAYS Performed at [facility], [place] [address], [place], [place] [address]    Report Status 07/20/2020 FINAL  Final    Studies/Results: No results found.    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)   Tricuspid regurgitation   Preexisting hypertension complicating pregnancy, antepartum   Dental caries   Endocarditis of tricuspid valve   History of VBAC   Lumbar back pain   Acute on chronic congestive heart failure (HCC)   Cellulitis of right lower extremity   Septic embolism (HCC)   Syphilis   [redacted] weeks gestation of pregnancy   Flank pain   Drug use affecting pregnancy in second  trimester   Maternal endocarditis affecting pregnancy in second trimester, antepartum   Methadone maintenance treatment affecting pregnancy ([place])   Heart disease in mother complicating pregnancy    Kelly [name] is a 35 y.o. female with admission yet again with right-sided endocarditis with MRSA and septic emboli to the lungs we suspect.  While she does not have overt vegetations on transthoracic echocardiogram I am confident that she has had them and they have embolized into her lungs.  She also has low back pain which could be related to septic embolization to her pregnancy or could be due to pathology in her lumbar spine.  Fortunately her blood cultures have cleared  We will continue vancomycin  She did not tolerate MRI of the spine but now her pain is better  We will try to treat her for 6 weeks  in the hospital  Dental infection added Augmentin   Rec showed multiple caries but no lucencies.  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

## 2020-07-25 NOTE — Progress Notes (Signed)
Pharmacy Antibiotic Note  Kelly [name] is a 35 y.o. female with MRSA bacteremia  And TV endocarditis. 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  Pharmacy has been consulted for vancomycin. Patient is pregnant with EGA of [redacted] weeks.  3/11: Vancomycin peak AM = 29, trough = 13.  Caculated AUC = 508 (at goal).    Planning 6 weeks of inpatient IV antibiotics.  Plan: Continue vancomycin to 750 mg IV q 8 hrs Monitor C&S, clinical status, renal function, VT as needed  Temp (24hrs), Avg:97.9 F (36.6 C), Min:97.7 F (36.5 C), Max:98 F (36.7 C)  Recent Labs  Lab 07/20/20 0500 07/21/20 1004 07/22/20 0505 07/22/20 0930 07/22/20 1500 07/23/20 0200 07/24/20 0348 07/25/20 0939  WBC 17.3* 17.5* 16.6*  --   --  15.8* 12.4*  --   CREATININE 0.46 0.53 0.59  --   --  0.52 0.48 0.50  VANCOTROUGH  --   --   --   --  13*  --   --   --   VANCOPEAK  --   --   --  29*  --   --   --   --     Estimated Creatinine Clearance: 107.5 mL/min (by C-G formula based on SCr of 0.5 mg/dL).    Allergies  Allergen Reactions  . Hydrocodone Itching  . Morphine And Related Nausea And Vomiting    Antimicrobials this admission: Azith 3/2 x1 CTX 3/2 x1 Vancomycin 3/2> Cefepime 3/2> 3/3  Cultures: 3/2 urine - ng 3/2 Bld - GPC - MRSA 3/2 MRSA PCR pos 3/4 BCx x 2 > Neg  Thank you for allowing pharmacy to be a part of this patient's care.  [place] Crane, Roylene Reason, BCCP Clinical Pharmacist  07/25/2020 1:57 PM   [facility] pharmacy phone numbers are listed on [email or web address]

## 2020-07-26 ENCOUNTER — Inpatient Hospital Stay (HOSPITAL_BASED_OUTPATIENT_CLINIC_OR_DEPARTMENT_OTHER): Payer: Medicaid Other

## 2020-07-26 DIAGNOSIS — R7881 Bacteremia: Secondary | ICD-10-CM | POA: Diagnosis not present

## 2020-07-26 DIAGNOSIS — O09523 Supervision of elderly multigravida, third trimester: Secondary | ICD-10-CM

## 2020-07-26 DIAGNOSIS — I38 Endocarditis, valve unspecified: Secondary | ICD-10-CM | POA: Diagnosis not present

## 2020-07-26 DIAGNOSIS — O34219 Maternal care for unspecified type scar from previous cesarean delivery: Secondary | ICD-10-CM

## 2020-07-26 DIAGNOSIS — Z3A28 28 weeks gestation of pregnancy: Secondary | ICD-10-CM

## 2020-07-26 DIAGNOSIS — O10013 Pre-existing essential hypertension complicating pregnancy, third trimester: Secondary | ICD-10-CM

## 2020-07-26 DIAGNOSIS — O99413 Diseases of the circulatory system complicating pregnancy, third trimester: Secondary | ICD-10-CM

## 2020-07-26 DIAGNOSIS — B9562 Methicillin resistant Staphylococcus aureus infection as the cause of diseases classified elsewhere: Secondary | ICD-10-CM | POA: Diagnosis not present

## 2020-07-26 LAB — CBC
HCT: 28.9 % — ABNORMAL LOW (ref 36.0–46.0)
Hemoglobin: 9.6 g/dL — ABNORMAL LOW (ref 12.0–15.0)
MCH: 29.9 pg (ref 26.0–34.0)
MCHC: 33.2 g/dL (ref 30.0–36.0)
MCV: 90 fL (ref 80.0–100.0)
Platelets: 464 K/uL — ABNORMAL HIGH (ref 150–400)
RBC: 3.21 MIL/uL — ABNORMAL LOW (ref 3.87–5.11)
RDW: 13.7 % (ref 11.5–15.5)
WBC: 11.3 K/uL — ABNORMAL HIGH (ref 4.0–10.5)
nRBC: 0 % (ref 0.0–0.2)

## 2020-07-26 LAB — HEPATITIS PANEL, ACUTE
HCV Ab: REACTIVE — AB
Hep A IgM: NONREACTIVE
Hep B C IgM: NONREACTIVE
Hepatitis B Surface Ag: NONREACTIVE

## 2020-07-26 LAB — BASIC METABOLIC PANEL WITH GFR
Anion gap: 7 (ref 5–15)
BUN: 12 mg/dL (ref 6–20)
CO2: 29 mmol/L (ref 22–32)
Calcium: 9.4 mg/dL (ref 8.9–10.3)
Chloride: 96 mmol/L — ABNORMAL LOW (ref 98–111)
Creatinine, Ser: 0.51 mg/dL (ref 0.44–1.00)
GFR, Estimated: >60 mL/min (ref >60)
Glucose, Bld: 88 mg/dL (ref 70–99)
Potassium: 4.3 mmol/L (ref 3.5–5.1)
Sodium: 132 mmol/L — ABNORMAL LOW (ref 135–145)

## 2020-07-26 LAB — MAGNESIUM: Magnesium: 1.8 mg/dL (ref 1.7–2.4)

## 2020-07-26 MED ORDER — MAGNESIUM SULFATE 2 GM/50ML IV SOLN
2.0000 g | Freq: Once | INTRAVENOUS | Status: AC
  Administered 2020-07-26: 2 g via INTRAVENOUS
  Filled 2020-07-26: qty 50

## 2020-07-26 NOTE — Progress Notes (Addendum)
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 -Current active IV heroin user, uses about 3 g daily and IV fentanyl, presented to the ED with 3 to 4 weeks history of bilateral lower extremity edema associated with worsening dyspnea on exertion for 4 days, intermittent fever for several weeks and pain with deep inspiration across her right lateral chest and flank. -In the ED she was noted to be fluid overload in addition blood cultures were positive for MRSA and she was a started on IV vancomycin. -2D echo without obvious vegetation, TEE was deferred and plan is to treat empirically for endocarditis -Patient was also found to be 26 weeks Pregnancy. Currently 28 weeks pregnancy.   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)   Tricuspid regurgitation   Preexisting hypertension complicating pregnancy, antepartum   Dental caries   Endocarditis of tricuspid valve   History of VBAC   Lumbar back pain   Acute on chronic congestive heart failure (HCC)   Cellulitis of right lower extremity   Septic embolism (HCC)   Syphilis   [redacted] weeks gestation of pregnancy   Flank pain   Drug use affecting pregnancy in second trimester   Maternal endocarditis affecting pregnancy in second trimester, antepartum   Methadone maintenance treatment affecting  pregnancy ([place])   Heart disease in mother complicating pregnancy  1-MRSA bacteremia, presumed endocarditis:  -ID recommend for patient to be treated for presume Endocarditis.  -Continue with IV vancomycin, ID following. -2D echo without obvious vegetation, TEE defer, plan to empirically treat as endocarditis. -Repeated blood cultures: So far negative. -Patient will  need 6 weeks of IV antibiotics inpatient. -Continue IV antibiotics.  -Afebrile, WBC trending down.   2-Acute on chronic Right side Heart Failure -Severe TR -Severe pulmonary hypertension -2D echo noted decreased RV systolic function, elevated PA pressure 92 severe TR. -Appreciate heart failure team management -Transition to oral lasix 3-12.  Appreciate cardiology assistance.  --13 L. Stable.  -Stable, on room air.   3-Chronic Anemia: -Anemia panel: B12 445, folate 7.4, iron 32, ferritin 111. -Hemoglobin baseline 8.5-9.5 -Hb stable. At 10--9.8  4-IV heroin and fentanyl abuse Chronic pain IV Dilaudid. Will start decreasing dose of dilaudid 0.5-1 mg PRN.  Discussed with Dr Dione Plover, plan to change methadone to 100 mg daily starting 3-15. Patient was able to tolerate previously100 mg of methadone daily.  Appreciate Dr Dione Plover assistance.  Continuous pulse oxygen.   Would monitor QT daily. Getting IV mag today  Viral hepatitis panel ordered.  Nutrition consulted.   5-28days weeks pregnancy: -OB service following, getting daily fetal heart assessment. -Counseling provided to patient regarding pain medication and effect to fetus.  -she will need assistance with housing.   6-Pneumonia: Possible septic pulmonary infarcts. 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 -Given penicillin 2.4 MU intramuscular  8-back pain: Unable to  tolerate MRI, discontinue with ID 10-Hyponatremia: monitor on Lasix 11-Jaw pain, tooth pain . Started on Augmentin. Order Mouth  wash. Orthopantogram : multiples caries.  Dental clinic [facility] long consulted.   Estimated body mass index is 27 kg/m as calculated from the following:   Height as of this encounter: 5' 7.5" (1.715 m).   Weight as of this encounter: 79.4 kg.   DVT prophylaxis: lovenox Code Status: Full Code Family Communication: care discussed with patient/ Disposition Plan:  Status is: Inpatient  Remains inpatient appropriate because:IV treatments appropriate due to intensity of illness or inability to take PO   Dispo: The patient is from: Home              Anticipated d/c is to: Home              Patient currently is not medically stable to d/c.   Difficult to place patient No        Consultants:   ID  GYN  Cardiology   Procedures:   None  Antimicrobials:  IV Vancomycin   Subjective: She is in better spirit. She is only complaining of jaw pain. She was glad to see Dr Dione Plover. He was very caring and understand her diseases (opioid use disordered.)   Objective: Vitals:   07/25/20 0727 07/25/20 1741 07/25/20 2124 07/26/20 0019  BP: 112/74 117/70 120/89 107/68  Pulse: 89 86 91 78  Resp: 20 20 (!) 21 18  Temp: 97.9 F (36.6 C) 97.8 F (36.6 C) 97.7 F (36.5 C) 97.7 F (36.5 C)  TempSrc: Oral Oral Oral Oral  SpO2: 96% 97% 98% 95%  Weight:      Height:        Intake/Output Summary (Last 24 hours) at 07/26/2020 1709 Last data filed at 07/26/2020 1631 Gross per 24 hour  Intake 520 ml  Output -  Net 520 ml   Filed Weights   07/23/20 0457 07/24/20 0327 07/25/20 0521  Weight: 83 kg 79.4 kg 79.4 kg    Examination:  General exam: Chronic ill appearing Respiratory system: CTA Cardiovascular system: S 1, S 2 RRR Gastrointestinal system: BS present, soft, nt GU:  Gravid uterus  Central nervous system: alert, answer questions.  Extremities: Trace edema    Data Reviewed: I have personally reviewed following labs and imaging studies  CBC: Recent Labs  Lab  07/21/20 1004 07/22/20 0505 07/23/20 0200 07/24/20 0348 07/26/20 0557  WBC 17.5* 16.6* 15.8* 12.4* 11.3*  HGB 10.9* 10.7* 9.8* 9.6* 9.6*  HCT 33.8* 33.0* 30.3* 28.6* 28.9*  MCV 91.1 90.9 90.7 89.7 90.0  PLT 483* 466* 437* 442* 793*   Basic Metabolic Panel: Recent Labs  Lab 07/20/20 0500 07/21/20 1004 07/22/20 0505 07/22/20 0939 07/23/20 0200 07/24/20 0348 07/25/20 0939 07/26/20 0557  NA 129*   < > 131*  --  129* 130* 133* 132*  K 4.5   < > 4.5  --  4.2 3.7 4.4 4.3  CL 90*   < > 92*  --  94* 97* 95* 96*  CO2 27   < > 30  --  26 24 29 29   GLUCOSE 85   < > 86  --  76 83 93 88  BUN 11   < > 20  --  17 13 11 12   CREATININE 0.46   < > 0.59  --  0.52 0.48 0.50 0.51  CALCIUM 9.2   < > 9.6  --  9.4 9.1 9.2 9.4  MG 1.8  --   --  1.9  --   --   --  1.8   < > = values in this interval not displayed.   GFR: Estimated Creatinine Clearance: 107.5 mL/min (by C-G formula based on SCr of 0.51 mg/dL). Liver Function Tests: No results for input(s): AST, ALT, ALKPHOS, BILITOT, PROT, ALBUMIN in the last 168 hours. No results for input(s): LIPASE, AMYLASE in the last 168 hours. No results for input(s): AMMONIA in the last 168 hours. Coagulation Profile: No results for input(s): INR, PROTIME in the last 168 hours. Cardiac Enzymes: No results for input(s): CKTOTAL, CKMB, CKMBINDEX, TROPONINI in the last 168 hours. BNP (last 3 results) No results for input(s): PROBNP in the last 8760 hours. HbA1C: No results for input(s): HGBA1C in the last 72 hours. CBG: Recent Labs  Lab 07/21/20 1210 07/25/20 0724  GLUCAP 121* 93   Lipid Profile: No results for input(s): CHOL, HDL, LDLCALC, TRIG, CHOLHDL, LDLDIRECT in the last 72 hours. Thyroid Function Tests: No results for input(s): TSH, T4TOTAL, FREET4, T3FREE, THYROIDAB in the last 72 hours. Anemia Panel: No results for input(s): VITAMINB12, FOLATE, FERRITIN, TIBC, IRON, RETICCTPCT in the last 72 hours. Sepsis Labs: No results for input(s):  PROCALCITON, LATICACIDVEN in the last 168 hours.  No results found for this or any previous visit (from the past 240 hour(s)).       Radiology Studies: [place] MFM FETAL BPP WO NON STRESS  Result Date: 07/26/2020 ----------------------------------------------------------------------  OBSTETRICS REPORT                       (Signed Final 07/26/2020 01:37 pm) ---------------------------------------------------------------------- Patient Info  ID #:       [id number]                          D.O.B.:  Sep 25, 1985 (34 yrs)  Name:       Kelly [name]              Visit Date: 07/26/2020 10:33 am ---------------------------------------------------------------------- Performed By  Attending:        Sander Nephew      Ref. Address:     [place]                    MD                                                             Rd  Performed By:     Berlinda Last          [facility]  Referred By:      Sloan Leiter                    MD ---------------------------------------------------------------------- Orders  #  Description  Code        Ordered By  1  [place] MFM FETAL BPP WO NON               [id number]    KELLY DAVIS     STRESS ----------------------------------------------------------------------  #  Order #                     Accession #                Episode #  1  [id number]                   [id number]                 [id number] ---------------------------------------------------------------------- Indications  Heart disease in mother affecting pregnancy    O99.413  in third trimester  [redacted] weeks gestation of pregnancy                Z3A.28  Hypertension - Chronic/Pre-existing            O27.035  Drug use complicating pregnancy, third         O34.323  trimester  Advanced maternal age multigravida 63+,        O22.523  third trimester  Previous cesarean delivery, antepartum x1      O34.219  ---------------------------------------------------------------------- Fetal Evaluation  Num Of Fetuses:         1  Fetal Heart Rate(bpm):  150  Cardiac Activity:       Observed  Presentation:           Cephalic  Placenta:               Anterior  Amniotic Fluid  AFI FV:      Within normal limits  AFI Sum(cm)     %Tile       Largest Pocket(cm)  9.77            8           3.31  RUQ(cm)       RLQ(cm)       LUQ(cm)        LLQ(cm)  2.86          1.56          2.04           3.31 ---------------------------------------------------------------------- Biophysical Evaluation  Amniotic F.V:   Within normal limits       F. Tone:        Observed  F. Movement:    Observed                   Score:          8/8  F. Breathing:   Observed ---------------------------------------------------------------------- OB History  Gravidity:    7         Term:   3        Prem:   0        SAB:   3  TOP:          0       Ectopic:  0        Living: 3 ---------------------------------------------------------------------- Gestational Age  Best:          28w 0d     Det. By:  U/S  (07/13/20)          EDD:   10/18/20 ---------------------------------------------------------------------- Anatomy  Abdomen:  Appears normal         Bladder:                Appears normal ---------------------------------------------------------------------- Impression  Antenatal testing performed, inpatient for mangement of CHF  for tricuspid endocarditis with septic emboli  The biophysical profile was 8/8 with good fetal movement and  amniotic fluid volume. ---------------------------------------------------------------------- Recommendations  Consider NST/AFI instead of BPP unless NST is nonreactive,  given fetal gestational age.  Continue weekly testing.  Serial growth every 4 weeks.  Management per inpatient providers ----------------------------------------------------------------------               Sander Nephew, MD Electronically Signed Final  Report   07/26/2020 01:37 pm ----------------------------------------------------------------------       Scheduled Meds: . sodium chloride   Intravenous Once  . acetaminophen  650 mg Oral Q6H  . amoxicillin-clavulanate  1 tablet Oral Q12H  . Chlorhexidine Gluconate Cloth  6 each Topical Daily  . enoxaparin (LOVENOX) injection  40 mg Subcutaneous Q24H  . furosemide  40 mg Oral Daily  . methadone  100 mg Oral Daily  . polyethylene glycol  17 g Oral BID  . prenatal vitamin w/FE, FA  1 tablet Oral Q1200  . senna-docusate  1 tablet Oral BID  . sodium chloride flush  10-40 mL Intracatheter Q12H   Continuous Infusions: . magnesium sulfate bolus IVPB 2 g (07/26/20 1631)  . vancomycin 750 mg (07/26/20 0905)     LOS: 13 days    Time spent: 35 minutes    Belkys A Regalado, MD [facility]   If 7PM-7AM, please contact night-coverage [email or web address]  07/26/2020, 5:09 PM

## 2020-07-26 NOTE — Progress Notes (Signed)
Physical Therapy Treatment & Discharge Patient Details Name: Kelly [name] MRN: [id number] DOB: 11-13-1985 Today's Date: 07/26/2020    History of Present Illness Pt is a 35 y.o. female admitted 07/13/20 with R-side abdominal pain, dyspnea, BLE edema; of note, pt is [redacted] weeks pregnant on admission. Echo reveals severe RV failure, severe TR. CXR concerning for PNA. Workup for MRSA bacteremia, presumed endocarditis, acute on chronic R-side HF. Per obstetrics, no acute OB concerns. PMH includes current IVDA/polysubstance abuse (relapsed 5 months ago, last use 3/2), epidural abscess and MSSA bacteremia (2019), HTN, bipolar disorder, ADD, scoliosis with spinal rods.   PT Comments    Pt progressing well with mobility; able to mobilize and perform ADL tasks independent, much improved stability with minimal SOB noted. SpO2 94-97% on RA with activity; HR up to 130. Pt pleasant and motivated to participate. Educ re: activity recommendations (hallway ambulation at least 3-5x/day), therex (handout provided), deep breathing strategies, importance of mobility. Pt has met short-term acute PT goals; reports no further questions or concerns. Will d/c acute PT. Please reconsult if new needs arise.    Follow Up Recommendations  No PT follow up     Equipment Recommendations  None recommended by PT    Recommendations for Other Services       Precautions / Restrictions Precautions Precautions: Fall;Other (comment) Precaution Comments: Abdominal for comfort - pregancy, lumbar/abdominal pain Restrictions Weight Bearing Restrictions: No    Mobility  Bed Mobility Overal bed mobility: Modified Independent                  Transfers Overall transfer level: Independent Equipment used: None Transfers: Sit to/from Stand              Ambulation/Gait Ambulation/Gait assistance: Independent Social research officer, government (Feet): 450 Feet Assistive device: None Gait Pattern/deviations: Step-through  pattern;Decreased stride length     General Gait Details: Fast paced gait without DME, intermittent use of hallway rail for UE support, but able to ambulate without when encouraged; independent with improved speed and stability. SpO2 94-97% on RA, slight DOE noted; HR 125-130   Stairs             Wheelchair Mobility    Modified Rankin (Stroke Patients Only)       Balance Overall balance assessment: Needs assistance   Sitting balance-Leahy Scale: Good       Standing balance-Leahy Scale: Good                              Cognition Arousal/Alertness: Awake/alert Behavior During Therapy: WFL for tasks assessed/performed Overall Cognitive Status: Within Functional Limits for tasks assessed                                        Exercises Other Exercises Other Exercises: [employer] HEP handout (Access Code [email or web address]) provided for standing therex with counter support - standing calf raise, partial squats, standing hip extension    General Comments General comments (skin integrity, edema, etc.): Educ re: activity recommendations (3-5x/day hallway ambulation), therex (HEP handout provided), importance of mobility      Pertinent Vitals/Pain Pain Assessment: No/denies pain Pain Intervention(s): Monitored during session    Home Living                      Prior Function  PT Goals (current goals can now be found in the care plan section) Progress towards PT goals: Goals met/education completed, patient discharged from PT    Frequency    Min 3X/week      PT Plan Current plan remains appropriate    Co-evaluation              AM-PAC PT "6 Clicks" Mobility   Outcome Measure  Help needed turning from your back to your side while in a flat bed without using bedrails?: None Help needed moving from lying on your back to sitting on the side of a flat bed without using bedrails?: None Help needed moving to and  from a bed to a chair (including a wheelchair)?: None Help needed standing up from a chair using your arms (e.g., wheelchair or bedside chair)?: None Help needed to walk in hospital room?: None Help needed climbing 3-5 steps with a railing? : None 6 Click Score: 24    End of Session   Activity Tolerance: Patient tolerated treatment well Patient left: in bed;with call bell/phone within reach Nurse Communication: Mobility status PT Visit Diagnosis: Other abnormalities of gait and mobility (R26.89);Pain     Time: 1205-1218 PT Time Calculation (min) (ACUTE ONLY): 13 min  Charges:  $Therapeutic Exercise: 8-22 mins                     Mabeline Caras, PT, DPT Acute Rehabilitation Services  Pager [phone] Office [place] 07/26/2020, 1:53 PM

## 2020-07-26 NOTE — Plan of Care (Signed)

## 2020-07-26 NOTE — Progress Notes (Signed)
NST completed.  Reactive and reassuring for [redacted] week gestation.  Dr Fulton Reek updated.  Will start with weekly BPP's along with daily NST's.  Will notify RROB team.  Call with any questions [phone].

## 2020-07-26 NOTE — Progress Notes (Signed)
G8P3 at 28 weeks today.  NST in progress.  No complaints of leaking, bleeding or cramping.  Reports good fetal movement.  In good spirits.  Continues inpatient stay for CHF, endocarditis, MRSA.  VSS.

## 2020-07-27 ENCOUNTER — Inpatient Hospital Stay (HOSPITAL_COMMUNITY): Payer: Medicaid Other

## 2020-07-27 DIAGNOSIS — M79621 Pain in right upper arm: Secondary | ICD-10-CM | POA: Diagnosis not present

## 2020-07-27 DIAGNOSIS — O99891 Other specified diseases and conditions complicating pregnancy: Secondary | ICD-10-CM | POA: Diagnosis not present

## 2020-07-27 DIAGNOSIS — O10013 Pre-existing essential hypertension complicating pregnancy, third trimester: Secondary | ICD-10-CM

## 2020-07-27 DIAGNOSIS — O99413 Diseases of the circulatory system complicating pregnancy, third trimester: Secondary | ICD-10-CM | POA: Diagnosis not present

## 2020-07-27 DIAGNOSIS — I38 Endocarditis, valve unspecified: Secondary | ICD-10-CM

## 2020-07-27 DIAGNOSIS — K0889 Other specified disorders of teeth and supporting structures: Secondary | ICD-10-CM

## 2020-07-27 DIAGNOSIS — I5023 Acute on chronic systolic (congestive) heart failure: Secondary | ICD-10-CM | POA: Diagnosis not present

## 2020-07-27 DIAGNOSIS — L03115 Cellulitis of right lower limb: Secondary | ICD-10-CM | POA: Diagnosis not present

## 2020-07-27 DIAGNOSIS — R7881 Bacteremia: Secondary | ICD-10-CM | POA: Diagnosis not present

## 2020-07-27 DIAGNOSIS — R1010 Upper abdominal pain, unspecified: Secondary | ICD-10-CM | POA: Diagnosis not present

## 2020-07-27 DIAGNOSIS — Z3A27 27 weeks gestation of pregnancy: Secondary | ICD-10-CM | POA: Diagnosis not present

## 2020-07-27 DIAGNOSIS — B9561 Methicillin susceptible Staphylococcus aureus infection as the cause of diseases classified elsewhere: Secondary | ICD-10-CM

## 2020-07-27 DIAGNOSIS — F1990 Other psychoactive substance use, unspecified, uncomplicated: Secondary | ICD-10-CM

## 2020-07-27 DIAGNOSIS — O99322 Drug use complicating pregnancy, second trimester: Secondary | ICD-10-CM | POA: Diagnosis not present

## 2020-07-27 DIAGNOSIS — Z3A28 28 weeks gestation of pregnancy: Secondary | ICD-10-CM | POA: Diagnosis not present

## 2020-07-27 DIAGNOSIS — O88313 Pyemic and septic embolism in pregnancy, third trimester: Secondary | ICD-10-CM

## 2020-07-27 DIAGNOSIS — M545 Low back pain, unspecified: Secondary | ICD-10-CM

## 2020-07-27 DIAGNOSIS — J189 Pneumonia, unspecified organism: Secondary | ICD-10-CM | POA: Diagnosis not present

## 2020-07-27 DIAGNOSIS — I330 Acute and subacute infective endocarditis: Secondary | ICD-10-CM

## 2020-07-27 DIAGNOSIS — B9562 Methicillin resistant Staphylococcus aureus infection as the cause of diseases classified elsewhere: Secondary | ICD-10-CM | POA: Diagnosis not present

## 2020-07-27 LAB — BASIC METABOLIC PANEL
Anion gap: 8 (ref 5–15)
BUN: 16 mg/dL (ref 6–20)
CO2: 28 mmol/L (ref 22–32)
Calcium: 9.4 mg/dL (ref 8.9–10.3)
Chloride: 98 mmol/L (ref 98–111)
Creatinine, Ser: 0.46 mg/dL (ref 0.44–1.00)
GFR, Estimated: >60 mL/min (ref >60)
Glucose, Bld: 105 mg/dL — ABNORMAL HIGH (ref 70–99)
Potassium: 4.2 mmol/L (ref 3.5–5.1)
Sodium: 134 mmol/L — ABNORMAL LOW (ref 135–145)

## 2020-07-27 LAB — CBC
HCT: 27.9 % — ABNORMAL LOW (ref 36.0–46.0)
Hemoglobin: 9.3 g/dL — ABNORMAL LOW (ref 12.0–15.0)
MCH: 29.9 pg (ref 26.0–34.0)
MCHC: 33.3 g/dL (ref 30.0–36.0)
MCV: 89.7 fL (ref 80.0–100.0)
Platelets: 470 10*3/uL — ABNORMAL HIGH (ref 150–400)
RBC: 3.11 MIL/uL — ABNORMAL LOW (ref 3.87–5.11)
RDW: 13.8 % (ref 11.5–15.5)
WBC: 10.8 10*3/uL — ABNORMAL HIGH (ref 4.0–10.5)
nRBC: 0 % (ref 0.0–0.2)

## 2020-07-27 MED ORDER — TETANUS-DIPHTH-ACELL PERTUSSIS 5-2.5-18.5 LF-MCG/0.5 IM SUSY
0.5000 mL | PREFILLED_SYRINGE | Freq: Once | INTRAMUSCULAR | Status: AC
  Administered 2020-07-28: 0.5 mL via INTRAMUSCULAR
  Filled 2020-07-27: qty 0.5

## 2020-07-27 MED ORDER — ENSURE ENLIVE PO LIQD
237.0000 mL | Freq: Two times a day (BID) | ORAL | Status: DC
  Administered 2020-07-27 – 2020-08-25 (×48): 237 mL via ORAL
  Filled 2020-07-27: qty 711

## 2020-07-27 NOTE — Progress Notes (Signed)
Right upper ext venous US completed     Please see CV Proc for preliminary results.   Vonzell Schlatter, RVT

## 2020-07-27 NOTE — Progress Notes (Signed)
Initial Nutrition Assessment  DOCUMENTATION CODES:   Not applicable  INTERVENTION:  Provide Ensure Enlive po BID, each supplement provides 350 kcal and 20 grams of protein.  Encourage adequate PO intake.   Continue prenatal vitamin once daily.   NUTRITION DIAGNOSIS:   Increased nutrient needs related to chronic illness (heart failure) as evidenced by estimated needs.  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 Pt noted to be fluid overload in addition blood cultures were positive for MRSA. Pt found to be pregnant. Pt is currently [redacted] weeks pregnant. Pt with acute on chronic Right side Heart Failure.  Per MD, pt with MRSA bacteremia with presumed endocarditis currently on IV vancomycin. Meal completion has been 75-100%. Pt reports having a good appetite currently and PTA with no difficulties. RD to order nutritional supplements to aid in increased caloric and protein needs. Educated on the importance of adequate nutrition for current illness as well as pregnancy. Pt expressed understanding. Will continue with prenatal vitamin daily.   Nutrition Focused physical exam:  Flowsheet Row Most Recent Value  Orbital Region Mild depletion  Upper Arm Region No depletion  Thoracic and Lumbar Region No depletion  Buccal Region Mild depletion  Temple  Region Mild depletion  Clavicle Bone Region No depletion  Clavicle and Acromion Bone Region No depletion  Scapular Bone Region Unable to assess  Dorsal Hand No depletion  Patellar Region No depletion  Anterior Thigh Region No depletion  Posterior Calf Region No depletion  Edema (RD Assessment) None  Hair Reviewed  Eyes Reviewed  Mouth Reviewed  Skin Reviewed  Nails Reviewed     Labs and medications reviewed.   Diet Order:   Diet Order            Diet regular Room service appropriate? Yes; Fluid consistency: Thin  Diet effective now                 EDUCATION NEEDS:   Education needs have been addressed  Skin:  Skin Assessment: Reviewed RN Assessment  Last BM:  3/16  Height:   Ht Readings from Last 1 Encounters:  07/13/20 5' 7.5" (1.715 m)    Weight:   Wt Readings from Last 1 Encounters:  07/27/20 81.2 kg   BMI:  Body mass index is 27.64 kg/m.  Estimated Nutritional Needs:   Kcal:  2100-2300  Protein:  105-115 grams  Fluid:  >/= 2 L/day  Corrin Parker, MS, RD, LDN RD pager number/after hours weekend pager number on Amion.

## 2020-07-27 NOTE — Progress Notes (Signed)
Department of Dental Medicine     INPATIENT CONSULTATION  Service Date:   07/27/2020 Admitted Date:  07/13/2020  Patient Name:  Kelly [name] Date of Birth:   1985-07-19 Medical Record Number: [id number]  Referring Provider:              Estill Cotta, MD  PLAN & RECOMMENDATIONS   > There are no current signs of acute odontogenic infection including abscess, edema or erythema, or suspicious lesion requiring biopsy.  The patient does have multiple grossly decayed teeth and broken down remaining dentition. >> Recommend eventual extractions of all non-restorable/grossly decayed teeth to decrease the risk of perioperative and postoperative systemic infection and complications. >>> Plan to discuss case with medical team and coordinate treatment as needed.  Due to the patient's current medical condition and status, would not recommend extractions at this time unless it is deemed medically necessary by her cardiology team/cardiothoracic surgery since there is no acute dental infection, abscess or evidence of developing periapical infection.  >>  Recommend the patient establish care at a dental office of their choice for routine dental care including replacement of missing teeth, cleanings and exams. >>  Discussed in detail all treatment options with the patient and they are agreeable to the plan.   Thank you for consulting with Hospital Dentistry and for the opportunity to participate in this patient's treatment.  Should you have any questions or concerns, please contact the [facility] at [phone].   07/27/2020     CONSULT NOTE   COVID 19 SCREENING: The patient denies symptoms concerning for COVID-19 infection including fever, chills, cough, or newly developed shortness of breath.   HISTORY OF PRESENT ILLNESS: > Kelly [name] is a  35 y.o. 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    Hospital Dentistry was consulted for tooth and jaw pain and dental caries.  DENTAL HISTORY: >The patient reports pain in several teeth, but the worst pain localized to the lower left quadrant (points to teeth numbers 19 and 20). She reports that her teeth are "rotting out of her head" and knows it is from her drug abuse. She reports having Dental Medicaid and is interested in finding a dental provider who she can see regularly that accepts her insurance.  She was previously seen in 2020 for tooth pain/infection in the lower right quadrant and was consulted at that time by Dr. Enrique Sack, D.D.S. who took the patient to OR to extract indicated teeth.  He noted several teeth with cavities at that time as well. >> Patient is able to manage oral secretions.  Patient denies dysphagia, odynophagia, dysphonia, SOB and neck pain.  Patient denies fever, rigors and malaise.   CHIEF COMPLAINT: Tooth/jaw pain   Patient Active Problem List   Diagnosis Date Noted  . Heart disease in mother complicating pregnancy   . Methadone maintenance treatment affecting pregnancy ([place]) 07/21/2020  . Drug use affecting pregnancy in second trimester   . Maternal endocarditis affecting pregnancy in second trimester, antepartum   .  [redacted] weeks gestation of pregnancy   . Flank pain   . Septic embolism ([place])   . Syphilis   . Cellulitis of right lower extremity   . MRSA bacteremia 07/13/2020  . Acute on chronic congestive heart failure ([place]) 07/13/2020  . Acute cystitis without  hematuria   . MDD (major depressive disorder), recurrent severe, without psychosis ([place]) 06/26/2019  . Oth psychoactive substance abuse w mood disorder ([place]) 06/26/2019  . Unwanted fertility 09/04/2018  . Chronic systolic heart failure ([place]) 08/29/2018  . Lumbar back pain   . Hematoma   . PICC (peripherally inserted central catheter) flush   . Infection due to Streptococcus mitis group 07/20/2018  . History of VBAC 07/16/2018  . Endocarditis of tricuspid valve 07/12/2018  . Dental caries 07/03/2018  . 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 Preexisting hypertension complicating pregnancy, antepartum 06/29/2018  . Supervision of other high risk pregnancies, third trimester 06/27/2018  . Rubella non-immune status, antepartum 06/23/2018  . 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 Tricuspid regurgitation 06/14/2018  . Difficult intravenous access   . IVDU (intravenous drug user)   . Bipolar disorder ([place]) 07/31/2016  . Major depressive disorder, recurrent episode with anxious distress ([place]) 07/29/2016  . Polysubstance abuse ([place]) 05/25/2016  . No prenatal care in current pregnancy in second trimester 05/25/2016  . Chronic hypertension affecting pregnancy 05/25/2016  . History of cesarean delivery affecting pregnancy 05/25/2016  . Community acquired pneumonia 05/25/2016  . Abscess of right breast 07/13/2014  . Opioid use disorder, severe, dependence ([place]) 08/25/2013  . Unspecified episodic mood disorder 08/25/2013  . ADHD (attention deficit hyperactivity disorder) 08/25/2013  . Cocaine abuse ([place]) 08/25/2013  . Thoracic back pain 01/29/2013  . CROHN'S DISEASE 05/03/2005  . COLONIC POLYPS 02/28/2003   Past Medical History:  Diagnosis Date  . 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 Abscess of breast    rt breast  . ADD (attention deficit disorder)   . Back pain    . Bacteremia due to Enterococcus 06/15/2018  . Bipolar 1 disorder ([place])   . Endocarditis 2020  . Foreign body granuloma of soft tissue, NEC, right upper arm   . Hypertension   . Influenza B 06/17/2018  . Leukocytosis   . Multifocal pneumonia 06/14/2018  . Polysubstance abuse ([place])   . Pyelonephritis affecting pregnancy 05/24/2016  . Sepsis ([place]) 12/02/2017  . Suicidal overdose [facility])    Past Surgical History:  Procedure Laterality Date  . BACK SURGERY     scoliosis-in care everywhere 08/1999  . CESAREAN SECTION    . CHOLECYSTECTOMY  March 2015  . FOREIGN BODY REMOVAL Right 05/28/2016   Procedure: REMOVAL FOREIGN BODY RIGHT ARM;  Surgeon: Meredith Pel, MD;  Location: [place];  Service: Orthopedics;  Laterality: Right;  . IRRIGATION AND DEBRIDEMENT ABSCESS Right 07/13/2014   Procedure: IRRIGATION AND DEBRIDEMEN TRIGHT BREAST ABSCESS;  Surgeon: Donnie Mesa, MD;  Location: [place];  Service: General;  Laterality: Right;  . LUMBAR LAMINECTOMY/DECOMPRESSION MICRODISCECTOMY N/A 12/05/2017   Procedure: LUMBAR LAMINECTOMY FOR EPIDURAL ABSCESS;  Surgeon: Newman Pies, MD;  Location: [place];  Service: Neurosurgery;  Laterality: N/A;  . MULTIPLE EXTRACTIONS WITH ALVEOLOPLASTY Bilateral 07/03/2018   Procedure: Extraction of tooth #'s 30 and 31 with alveoloplasty;  Surgeon: Lenn Cal, DDS;  Location: [place];  Service: Oral Surgery;  Laterality: Bilateral;  . RADIOLOGY WITH ANESTHESIA N/A 12/05/2017  Procedure: MRI WITH ANESTHESIA OF [facility] AND LUMBAR SPINE WITH AND WITHOUT;  Surgeon: Radiologist, Medication, MD;  Location: [place];  Service: Radiology;  Laterality: N/A;  . RIGHT/LEFT HEART CATH AND CORONARY ANGIOGRAPHY N/A 05/21/2019   Procedure: RIGHT/LEFT HEART CATH AND CORONARY ANGIOGRAPHY;  Surgeon: Jolaine Artist, MD;  Location: [place] CV [facility];  Service: Cardiovascular;  Laterality: N/A;  . TEE WITHOUT CARDIOVERSION N/A 05/21/2019   Procedure: TRANSESOPHAGEAL ECHOCARDIOGRAM (TEE);   Surgeon: Jolaine Artist, MD;  [facility] ENDOSCOPY;  Service: Cardiovascular;  Laterality: N/A;   Allergies  Allergen Reactions  . Hydrocodone Itching  . Morphine And Related Nausea And Vomiting   Current Facility-Administered Medications  Medication Dose Route Frequency Provider Last Rate Last Admin  . 0.9 %  sodium chloride infusion (Manually program via Guardrails IV Fluids)   Intravenous Once Minor, Grace Bushy, NP      . acetaminophen (TYLENOL) tablet 650 mg  650 mg Oral Q6H Minor, Grace Bushy, NP   650 mg at 07/27/20 1232  . amoxicillin-clavulanate (AUGMENTIN) 875-125 MG per tablet 1 tablet  1 tablet Oral Q12H Tommy Medal, [place] Islam, MD   1 tablet at 07/27/20 0857  . Chlorhexidine Gluconate Cloth 2 % PADS 6 each  6 each Topical Daily Janyth Pupa, DO   6 each at 07/27/20 1140  . enoxaparin (LOVENOX) injection 40 mg  40 mg Subcutaneous Q24H Carney, Gay Filler, RPH   40 mg at 07/26/20 1625  . furosemide (LASIX) tablet 40 mg  40 mg Oral Daily Bensimhon, Shaune Pascal, MD   40 mg at 07/27/20 0858  . HYDROmorphone (DILAUDID) injection 0.5-1 mg  0.5-1 mg Intravenous Q4H PRN Regalado, Belkys A, MD   1 mg at 07/27/20 0856  . magic mouthwash w/lidocaine  5 mL Oral TID PRN Regalado, Belkys A, MD   5 mL at 07/27/20 1233  . methadone (DOLOPHINE) tablet 100 mg  100 mg Oral Daily Regalado, Belkys A, MD   100 mg at 07/27/20 0855  . polyethylene glycol (MIRALAX / GLYCOLAX) packet 17 g  17 g Oral BID Domenic Polite, MD   17 g at 07/22/20 0856  . prenatal vitamin w/FE, FA (NATACHEW) chewable tablet 1 tablet  1 tablet Oral Q1200 Domenic Polite, MD   1 tablet at 07/27/20 1232  . prochlorperazine (COMPAZINE) injection 10 mg  10 mg Intravenous Q6H PRN Minor, Grace Bushy, NP   10 mg at 07/27/20 1233  . senna-docusate (Senokot-S) tablet 1 tablet  1 tablet Oral BID Domenic Polite, MD   1 tablet at 07/26/20 0902  . sodium chloride flush (NS) 0.9 % injection 10-40 mL  10-40 mL Intracatheter PRN Minor, Grace Bushy, NP    10 mL at 07/17/20 1942  . sodium chloride flush (NS) 0.9 % injection 10-40 mL  10-40 mL Intracatheter Q12H Regalado, Belkys A, MD   10 mL at 07/27/20 0858  . sodium chloride flush (NS) 0.9 % injection 10-40 mL  10-40 mL Intracatheter PRN Regalado, Belkys A, MD   10 mL at 07/26/20 0644  . vancomycin (VANCOCIN) IVPB 750 mg/150 ml premix  750 mg Intravenous Q8H Carney, Jessica C, RPH 150 mL/hr at 07/27/20 0847 750 mg at 07/27/20 0847    LABS: Lab Results  Component Value Date   WBC 10.8 (H) 07/27/2020   HGB 9.3 (L) 07/27/2020   HCT 27.9 (L) 07/27/2020   MCV 89.7 07/27/2020   PLT 470 (H) 07/27/2020      Component Value Date/Time   NA  134 (L) 07/27/2020 0356   NA 136 07/02/2013 0547   K 4.2 07/27/2020 0356   K 3.7 07/02/2013 0547   CL 98 07/27/2020 0356   CL 102 07/02/2013 0547   CO2 28 07/27/2020 0356   CO2 30 07/02/2013 0547   GLUCOSE 105 (H) 07/27/2020 0356   GLUCOSE 113 (H) 07/02/2013 0547   BUN 16 07/27/2020 0356   BUN 7 07/02/2013 0547   CREATININE 0.46 07/27/2020 0356   CREATININE 0.82 07/02/2013 0547   CALCIUM 9.4 07/27/2020 0356   CALCIUM 8.8 07/02/2013 0547   GFRNONAA >60 07/27/2020 0356   GFRNONAA >60 07/02/2013 0547   GFRAA >60 06/26/2019 1812   GFRAA >60 07/02/2013 0547   Lab Results  Component Value Date   INR 0.99 06/14/2018   INR 1.0 06/28/2013   No results found for: PTT  Social History   Socioeconomic History  . Marital status: Single    Spouse name: Not on file  . Number of children: Not on file  . Years of education: Not on file  . Highest education level: Not on file  Occupational History  . Occupation: unemployed  Tobacco Use  . Smoking status: Former Smoker    Packs/day: 1.00    Years: 13.00    Pack years: 13.00    Types: Cigarettes    Quit date: 06/13/2018    Years since quitting: 2.1  . Smokeless tobacco: Never Used  Vaping Use  . Vaping Use: Never used  Substance and Sexual Activity  . Alcohol use: Not Currently    Comment: one  or two drinks weekly  . Drug use: Yes    Types: Cocaine, Heroin, IV, Fentanyl    Comment: heroin, crack cocaine, fentanyl last used today (07/13/20)   . Sexual activity: Yes    Birth control/protection: Condom  Other Topics Concern  . Not on file  Social History Narrative  . Not on file   Social Determinants of Health   Financial Resource Strain: Not on file  Food Insecurity: Not on file  Transportation Needs: Not on file  Physical Activity: Not on file  Stress: Not on file  Social Connections: Not on file  Intimate Partner Violence: Not on file   Family History  Problem Relation Age of Onset  . Diabetes Mother   . Diabetes Father   . Hypertension Father   . Heart attack Neg Hx   . Hyperlipidemia Neg Hx   . Sudden death Neg Hx     REVIEW OF SYSTEMS: Reviewed with the patient as per HPI. PSYCH: Patient denies having dental phobia.  VITAL SIGNS: BP 111/80 (BP Location: Left Arm)   Pulse 86   Temp 97.7 F (36.5 C) (Oral)   Resp 17   Ht 5' 7.5" (1.715 m)   Wt 81.2 kg   SpO2 98%   BMI 27.64 kg/m    PHYSICAL EXAM: >> General:  Well-developed, comfortable and in no apparent distress. >> Neurological:  Alert and oriented to person, place and  time. >> Extraoral:  Facial symmetry present without any edema or erythema.  No swelling or lymphadenopathy.  TMJ asymptomatic without clicks or crepitations. >> Intraoral:  Soft tissues appear well-perfused and mucous membranes moist.  FOM and vestibules soft and not raised. Oral cavity without mass or lesion. No signs of infection, parulis, sinus tract, edema or erythema evident upon exam.   DENTAL EXAM: All clinical findings charted.   >> Dentition:  Overall poor remaining dentition.  Missing teeth, gross caries  evident in all 4 quadrants, retained root tips, existing crowns and restorations. >> The patient is maintaining poor oral hygiene. >> Periodontal: Inflamed, erythematous gingival tissue and generalized plaque  accumulation. >> Caries: Rampant decay in all 4 quadrants >> Retained Root Tips: #5 >> Removable/Fixed Prosthodontics: Patient denies wearing partial dentures. >> Occlusion: Unable to assess molar occlusion.  Non-functional teeth #2 and #3.   RADIOGRAPHIC EXAM:  07/22/20 Orthopanogram interpreted. >> Condyles seated bilaterally in fossas.  No evidence of abnormal pathology.  All visualized osseous structures appear WNL. >> Missing teeth, retained root tip #5, rampant decay- teeth numbers 6, 7, 8, 15, 18, 19 and 20 with deep decay approximating the pulp. Previous endodontic therapy on tooth #3 with full-coverage crown.  No signs of periapical radiolucency.   ASSESSMENT:  1. TV endocarditis 2. MRSA bacteremia 3. Missing teeth 4. Caries 5. Retained dental root 6. Accretions on teeth 7. Chronic gingivitis 8. Pulpitis 9. Postoperative bleeding risk   PLAN AND RECOMMENDATIONS: > I discussed the risks, benefits, and complications of various scenarios with the patient in relationship to their medical and dental conditions, which included systemic infection such as recurrent endocarditis, bacteremia or other serious issues that could potentially occur either before, during or after their anticipated surgery or discharge date if dental/oral concerns are not addressed.  I explained that if any chronic or acute dental/oral infection(s) are addressed and subsequently not maintained following medical optimization and recovery, their risk of the previously mentioned complications are just as high and could potentially occur postoperatively.  I explained all significant findings of the dental consultation with the patient including multiple teeth with gross decay, retained root tips and gingival inflammation and the recommended care including multiple extractions in order to optimize them from a dental standpoint.  The patient verbalized understanding of all findings, discussion, and recommendations. >>  We then discussed various treatment options to include no treatment, multiple extractions with alveoloplasty, pre-prosthetic surgery as indicated, periodontal therapy, dental restorations, root canal therapy, crown and bridge therapy, implant therapy, and replacement of missing teeth as indicated.  The patient verbalized understanding of all options, and currently wishes to proceed with any extractions as indicated with alveoloplasty as needed.  The patient asked about when or what was going to be done about the teeth that were hurting her.  I explained that I would discuss her case with cardiothoracic surgery prior to confirming any extractions because those would need to be completed in the operating room under general anesthesia, and due to her current medical condition and her status, I would not want to proceed with treatment unless otherwise medically necessary so as not to put her or her pregnancy at further risk of complications.  I explained that there is no acute dental infection including abscess, edema/erythema or sinus tract producing purulence, so if possible deferring treatment until she is stabilized is recommended. >>>  Plan to discuss all findings and recommendations with medical team and coordinate future care as needed.  <> The patient tolerated today's visit well.  All questions and concerns were addressed and answered before conclusion of the consultation.    Monserrate Benson [place], D.M.D.

## 2020-07-27 NOTE — Progress Notes (Signed)
FACULTY PRACTICE ANTEPARTUM PROGRESS NOTE  Kelly [name] is a 35 y.o. [phone] at 26w1dwho is admitted for MRSA bacteremia, infective endocarditis, severe tricuspid regurgitation, septic emboli with h/o discitis/osteomyelitis/epidural abscess, syphilis, chronic HTN, and acute on chronic right side heart failure in the setting of longstanding h/o IV heroin and fentanyl abuse.  Estimated Date of Delivery: 10/18/20 Fetal presentation is breech per 07/20/20 ultrasound  Length of Stay:  14 Days. Admitted 07/13/2020  Subjective: Patient reports normal fetal movement.  She denies uterine contractions, denies bleeding and leaking of fluid per vagina. Reports her pain is better controlled today on the 100 mg methadone.  Vitals:  Blood pressure 108/72, pulse 83, temperature 97.9 F (36.6 C), temperature source Oral, resp. rate 18, height 5' 7.5" (1.715 m), weight 81.2 kg, SpO2 100 %, not currently breastfeeding. Physical Examination: CONSTITUTIONAL: Well-developed, well-nourished female in no acute distress.  HENT:  Normocephalic, atraumatic, External right and left ear normal. Oropharynx is clear and moist EYES: Conjunctivae and EOM are normal. Pupils are equal, round, and reactive to light. No scleral icterus.  NECK: Normal range of motion, supple, no masses. SKIN: Skin is warm and dry. No rash noted. Not diaphoretic. No erythema. No pallor. [place] and oriented to person, place, and time. Normal reflexes, muscle tone coordination. No cranial nerve deficit noted. PSYCHIATRIC: Normal mood and affect. Normal behavior. Normal judgment and thought content. CARDIOVASCULAR: Normal heart rate noted RESPIRATORY: Effort normal, no problems with respiration noted MUSCULOSKELETAL: Normal range of motion. No edema and no tenderness. ABDOMEN: Soft, nontender, nondistended, gravid. CERVIX: deferred  Fetal monitoring: FHR: 145 bpm, Variability: moderate, Accelerations: Present, Decelerations: Absent   Uterine activity: no contractions per hour  Results for orders placed or performed during the hospital encounter of 07/13/20 (from the past 48 hour(s))  CBC     Status: Abnormal   Collection Time: 07/26/20  5:57 AM  Result Value Ref Range   WBC 11.3 (H) 4.0 - 10.5 K/uL   RBC 3.21 (L) 3.87 - 5.11 MIL/uL   Hemoglobin 9.6 (L) 12.0 - 15.0 g/dL   HCT 28.9 (L) 36.0 - 46.0 %   MCV 90.0 80.0 - 100.0 fL   MCH 29.9 26.0 - 34.0 pg   MCHC 33.2 30.0 - 36.0 g/dL   RDW 13.7 11.5 - 15.5 %   Platelets 464 (H) 150 - 400 K/uL   nRBC 0.0 0.0 - 0.2 %    Comment: Performed at [facility] [address] [place] [id number] Basic metabolic panel     Status: Abnormal   Collection Time: 07/26/20  5:57 AM  Result Value Ref Range   Sodium 132 (L) 135 - 145 mmol/L   Potassium 4.3 3.5 - 5.1 mmol/L   Chloride 96 (L) 98 - 111 mmol/L   CO2 29 22 - 32 mmol/L   Glucose, Bld 88 70 - 99 mg/dL    Comment: Glucose reference range applies only to samples taken after fasting for at least 8 hours.   BUN 12 6 - 20 mg/dL   Creatinine, Ser 0.51 0.44 - 1.00 mg/dL   Calcium 9.4 8.9 - 10.3 mg/dL   GFR, Estimated >60 >60 mL/min    Comment: (NOTE) Calculated using the CKD-EPI Creatinine Equation (2021)    Anion gap 7 5 - 15    Comment: Performed at [facility] [place] [id number] Magnesium     Status: None   Collection Time: 07/26/20  5:57 AM  Result Value Ref Range   Magnesium 1.8 1.7 - 2.4 mg/dL    Comment: Performed at [facility] [address], [place], [place] [address]  Hepatitis panel, acute     Status: Abnormal   Collection Time: 07/26/20  9:00 AM  Result Value Ref Range   Hepatitis B Surface Ag NON REACTIVE NON REACTIVE   HCV Ab Reactive (A) NON REACTIVE    Comment: (NOTE) The CDC recommends that a Reactive HCV antibody result be followed up  with a HCV Nucleic Acid Amplification test.     Hep A IgM NON REACTIVE NON REACTIVE   Hep B C IgM NON  REACTIVE NON REACTIVE    Comment: Performed at [facility], [place] [address], [place], [place] [address]  CBC     Status: Abnormal   Collection Time: 07/27/20  3:56 AM  Result Value Ref Range   WBC 10.8 (H) 4.0 - 10.5 K/uL   RBC 3.11 (L) 3.87 - 5.11 MIL/uL   Hemoglobin 9.3 (L) 12.0 - 15.0 g/dL   HCT 27.9 (L) 36.0 - 46.0 %   MCV 89.7 80.0 - 100.0 fL   MCH 29.9 26.0 - 34.0 pg   MCHC 33.3 30.0 - 36.0 g/dL   RDW 13.8 11.5 - 15.5 %   Platelets 470 (H) 150 - 400 K/uL   nRBC 0.0 0.0 - 0.2 %    Comment: Performed at [facility], [place] [address] [place], [place] [address]  Basic metabolic panel     Status: Abnormal   Collection Time: 07/27/20  3:56 AM  Result Value Ref Range   Sodium 134 (L) 135 - 145 mmol/L   Potassium 4.2 3.5 - 5.1 mmol/L   Chloride 98 98 - 111 mmol/L   CO2 28 22 - 32 mmol/L   Glucose, Bld 105 (H) 70 - 99 mg/dL    Comment: Glucose reference range applies only to samples taken after fasting for at least 8 hours.   BUN 16 6 - 20 mg/dL   Creatinine, Ser 0.46 0.44 - 1.00 mg/dL   Calcium 9.4 8.9 - 10.3 mg/dL   GFR, Estimated >60 >60 mL/min    Comment: (NOTE) Calculated using the CKD-EPI Creatinine Equation (2021)    Anion gap 8 5 - 15    Comment: Performed at [facility] [address], [place], [place] [address]    I have reviewed the patient's current medications.  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)   Tricuspid regurgitation   Preexisting hypertension complicating pregnancy, antepartum   Dental caries   Endocarditis of tricuspid valve   History of VBAC   Lumbar back pain   Acute on chronic congestive heart failure (HCC)   Cellulitis of right  lower extremity   Septic embolism (HCC)   Syphilis   [redacted] weeks gestation of pregnancy   Flank pain   Drug use affecting pregnancy in second trimester   Maternal endocarditis affecting pregnancy in second trimester, antepartum   Methadone maintenance treatment affecting pregnancy ([place])   Heart disease in mother complicating pregnancy   PLAN: - 2 hr glucose test scheduled for tomorrow am - cont methadone 100 mg daily -  Management per IM, ID and Cardiology teams as current hospitalization is not related to pregnancy  - Appreciate the multidisciplinary care for this complex patient. - Will defer decisions about appropriate analgesia to her primaryteam.  - Continue antibiotic therapies as per ID. - From an OB standpoint, fetal well being reassuring. Reassuring FHT - Weekly BPP   - For TOLAC - We will continue to follow patient closely. Daily FHR assessment will be done by our Rapid Response OB Nurse as directed, and weekly ultrasounds by our MFM team.    Please call [phone] [facility]N Consult Attending Monday-Friday 8am - 5pm) or [phone] [name] OB/GYN Attending On Call all day, every day) for any obstetric concerns at any time.   Total consultation time including face-to-face time with patient (>50% of time), reviewing chart and documentation: 25 minutes   Continue routine antenatal care.   Feliz Beam, MD, [facility] [employer] (Faculty Practice)  07/27/2020 4:43 PM

## 2020-07-27 NOTE — Plan of Care (Signed)
  Problem: Clinical Measurements: Goal: Ability to maintain clinical measurements within normal limits will improve Outcome: Progressing Goal: Diagnostic test results will improve Outcome: Progressing Goal: Respiratory complications will improve Outcome: Progressing   Problem: Activity: Goal: Risk for activity intolerance will decrease Outcome: Progressing   Problem: Coping: Goal: Level of anxiety will decrease Outcome: Progressing   Problem: Elimination: Goal: Will not experience complications related to bowel motility Outcome: Progressing Goal: Will not experience complications related to urinary retention Outcome: Progressing   Problem: Pain Managment: Goal: General experience of comfort will improve Outcome: Progressing   Problem: Safety: Goal: Ability to remain free from injury will improve Outcome: Progressing   Problem: Clinical Measurements: Goal: Ability to avoid or minimize complications of infection will improve Outcome: Progressing   Problem: Education: Goal: Knowledge of disease or condition will improve Outcome: Progressing Goal: Knowledge of the prescribed therapeutic regimen will improve Outcome: Progressing   Problem: Clinical Measurements: Goal: Complications related to the disease process, condition or treatment will be avoided or minimized Outcome: Progressing

## 2020-07-27 NOTE — Progress Notes (Signed)
Pharmacy Antibiotic Note  Kelly [name] is a 35 y.o. female with MRSA bacteremia  And TV endocarditis. 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  Pharmacy has been consulted for vancomycin. Patient is pregnant with EGA of [redacted] weeks.  3/11: Vancomycin peak AM = 29, trough = 13.  Caculated AUC = 508 (at goal).    Patient currently afebrile, wbc trended down to 10. Scr stable 0.46. Plan on rechecking level in a few days.   Planning 6 weeks of inpatient IV antibiotics.  Plan: Continue vancomycin to 750 mg IV q 8 hrs Monitor C&S, clinical status, renal function, VT as needed  Temp (24hrs), Avg:98 F (36.7 C), Min:97.7 F (36.5 C), Max:98.4 F (36.9 C)  Recent Labs  Lab 07/22/20 0505 07/22/20 0930 07/22/20 1500 07/23/20 0200 07/24/20 0348 07/25/20 0939 07/26/20 0557 07/27/20 0356  WBC 16.6*  --   --  15.8* 12.4*  --  11.3* 10.8*  CREATININE 0.59  --   --  0.52 0.48 0.50 0.51 0.46  VANCOTROUGH  --   --  13*  --   --   --   --   --   VANCOPEAK  --  29*  --   --   --   --   --   --     Estimated Creatinine Clearance: 108.8 mL/min (by C-G formula based on SCr of 0.46 mg/dL).    Allergies  Allergen Reactions  . Hydrocodone Itching  . Morphine And Related Nausea And Vomiting    Antimicrobials this admission: Azith 3/2 x1 CTX 3/2 x1 Vancomycin 3/2> Cefepime 3/2> 3/3  Cultures: 3/2 urine - ng 3/2 Bld - GPC - MRSA 3/2 MRSA PCR pos 3/4 BCx x 2 > Neg  Thank you for allowing pharmacy to be a part of this patient's care.  Erin Hearing PharmD., BCPS Clinical Pharmacist 07/27/2020 2:27 PM  [facility] pharmacy phone numbers are listed on [email or web address]

## 2020-07-27 NOTE — Progress Notes (Signed)
[facility] Consult Progress Note                                                                               Patient Demographics  Kelly [name], is a 35 y.o. female, DOB - 09/20/1985, HMC:[id number]  Admit date - 07/13/2020   Admitting Physician Kipp Brood, MD  Outpatient Primary MD for the patient is Pa, [facility]  Outpatient specialists:   LOS - 14  days   Medical records reviewed and are as summarized below:    Chief Complaint  Patient presents with  . 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 -Current active IV heroin user, uses about 3 g daily and IV fentanyl, presented to the ED with 3 to 4 weeks history of bilateral lower extremity edema associated with worsening dyspnea on exertion for 4 days, intermittent fever for several weeks and pain with deep inspiration across her right lateral chest and flank. -In the ED she was noted to be fluid overload, blood cultures were positive for MRSA and she was a started on IV vancomycin. -2D echo without obvious vegetation, TEE was deferred and plan is to treat empirically for endocarditis -Patient was also found to be 26 weeks Pregnancy. Currently 28 weeks pregnancy.    Assessment & Plan   28 weeks pregnancy -Management per primary service, OB  MRSA bacteremia with presumed endocarditis -Per ID recommendations, continue IV vancomycin -2D echo without obvious vegetations, TEE deferred as empirically treating as endocarditis -Repeat blood cultures so far negative -Patient will need 6 weeks of IV antibiotics inpatient -Leukocytosis trending down, no fevers  Acute on chronic right-sided heart failure with severe TR, severe  pulmonary hypertension -2D echo showed decreased RV systolic function, elevated PA pressure 92, severe TR -Was followed by cardiology/CHF team, patient has been transitioned to oral Lasix on 3/12, continue Lasix 40 mg daily -Negative balance of 11.5 L, O2 sats 100% on room air  Anemia of chronic disease -Baseline hemoglobin 8.5-9.5 -Currently at baseline -B12 445, folate 7.4, iron 32, ferritin 111 -Currently on prenatal vitamins  History of IV heroin and fentanyl abuse, chronic pain syndrome -Patient now started on methadone 100 mg daily on 3/15, she has previously tolerated 100 mg of methadone daily -Dilaudid decreased 2.5 to 1 mg every 4 hours as needed for severe/breakthrough pain -Continue monitoring pulse O2, QTc -HCV antibody+, hep A, hep B negative  Pneumonia, possibly septic pulmonary infarcts -Continue IV vancomycin  Syphilis -Positive RPR with rash per ID, could be secondary syphilis -Penicillin 2.4MU IM given  Hyponatremia -Improving monitor while on Lasix  Back pain -Unable to tolerate MRI, was discontinued, currently on IV antibiotics  Jaw pain, tooth pain, dental caries -Placed on Augmentin, multiple caries, outpatient follow-up with [facility]   Code Status: Full CODE STATUS DVT Prophylaxis:  enoxaparin (LOVENOX) injection 40 mg Start: 07/25/20 1700 Place TED hose  Start: 07/13/20 1900 SCDs Start: 07/13/20 1653   Level of Care: Level of care: Progressive Family Communication: Discussed all imaging results, lab results, explained to the patient   Disposition Plan:     Status is: Inpatient   Time Spent in minutes   25 minutes   Antimicrobials:   Anti-infectives (From admission, onward)   Start     Dose/Rate Route Frequency Ordered Stop   07/22/20 1045  amoxicillin-clavulanate (AUGMENTIN) 875-125 MG per tablet 1 tablet        1 tablet Oral Every 12 hours 07/22/20 0952 07/31/20 2359   07/18/20 0830  vancomycin (VANCOCIN) IVPB 750  mg/150 ml premix        750 mg 150 mL/hr over 60 Minutes Intravenous Every 8 hours 07/18/20 0750     07/16/20 0400  Vancomycin (VANCOCIN) 1,250 mg in sodium chloride 0.9 % 250 mL IVPB  Status:  Discontinued        1,250 mg 166.7 mL/hr over 90 Minutes Intravenous Every 8 hours 07/15/20 1944 07/18/20 0749   07/15/20 1715  penicillin g benzathine (BICILLIN LA) 1200000 UNIT/2ML injection 2.4 Million Units        2.4 Million Units Intramuscular  Once 07/15/20 1621 07/15/20 1747   07/14/20 0300  vancomycin (VANCOCIN) IVPB 1000 mg/200 mL premix        1,000 mg 200 mL/hr over 60 Minutes Intravenous Every 8 hours 07/13/20 1806 07/15/20 2059   07/13/20 1730  Vancomycin (VANCOCIN) 1,500 mg in sodium chloride 0.9 % 500 mL IVPB        1,500 mg 250 mL/hr over 120 Minutes Intravenous  Once 07/13/20 1709 07/13/20 2116   07/13/20 1730  ceFEPIme (MAXIPIME) 2 g in sodium chloride 0.9 % 100 mL IVPB  Status:  Discontinued        2 g 200 mL/hr over 30 Minutes Intravenous Every 8 hours 07/13/20 1709 07/14/20 1050   07/13/20 1315  cefTRIAXone (ROCEPHIN) 1 g in sodium chloride 0.9 % 100 mL IVPB        1 g 200 mL/hr over 30 Minutes Intravenous  Once 07/13/20 1306 07/13/20 1538   07/13/20 1315  azithromycin (ZITHROMAX) 500 mg in sodium chloride 0.9 % 250 mL IVPB        500 mg 250 mL/hr over 60 Minutes Intravenous  Once 07/13/20 1306 07/13/20 1644          Medications  Scheduled Meds: . sodium chloride   Intravenous Once  . acetaminophen  650 mg Oral Q6H  . amoxicillin-clavulanate  1 tablet Oral Q12H  . Chlorhexidine Gluconate Cloth  6 each Topical Daily  . enoxaparin (LOVENOX) injection  40 mg Subcutaneous Q24H  . feeding supplement  237 mL Oral BID BM  . furosemide  40 mg Oral Daily  . methadone  100 mg Oral Daily  . polyethylene glycol  17 g Oral BID  . prenatal vitamin w/FE, FA  1 tablet Oral Q1200  . senna-docusate  1 tablet Oral BID  . sodium chloride flush  10-40 mL Intracatheter Q12H    Continuous Infusions: . vancomycin 750 mg (07/27/20 1530)   PRN Meds:.HYDROmorphone (DILAUDID) injection, magic mouthwash w/lidocaine, prochlorperazine, sodium chloride flush, sodium chloride flush      Subjective:   Kelly [name] was seen and examined today.  No complaints this morning, resting comfortably.  Pleasant.  Patient denies dizziness, chest pain, shortness of breath, abdominal pain, N/V. No acute events overnight.    Objective:   Vitals:   07/26/20 2050  07/27/20 0339 07/27/20 0842 07/27/20 1449  BP: 104/61 104/65 111/80 108/72  Pulse: 79 81 86 83  Resp: 16 17 17 18   Temp: 98 F (36.7 C) 98.4 F (36.9 C) 97.7 F (36.5 C) 97.9 F (36.6 C)  TempSrc: Oral Oral Oral Oral  SpO2: 95% 98% 98% 100%  Weight:  81.2 kg    Height:        Intake/Output Summary (Last 24 hours) at 07/27/2020 1545 Last data filed at 07/27/2020 1000 Gross per 24 hour  Intake 1530 ml  Output -  Net 1530 ml     Wt Readings from Last 3 Encounters:  07/27/20 81.2 kg  07/23/19 80.3 kg  05/21/19 78 kg     Exam  General: Alert and oriented x 3, NAD, pleasant  Cardiovascular: S1 S2 auscultated, no murmurs, RRR  Respiratory: CTA B  Gastrointestinal: Normal bowel sounds soft  Ext: no pedal edema bilaterally  Neuro: moving all 4 extremities  Musculoskeletal: No digital cyanosis, clubbing  Skin: No rashes  Psych: Normal affect and demeanor, alert and oriented x3    Data Reviewed:  I have personally reviewed following labs and imaging studies  Micro Results No results found for this or any previous visit (from the past 240 hour(s)).  Radiology Reports DG Orthopantogram  Result Date: 07/22/2020 CLINICAL DATA:  Dental pain EXAM: ORTHOPANTOGRAM/PANORAMIC COMPARISON:  06/25/2018 FINDINGS: Negative for fracture. Interval extraction of right lower molars. Interval healing of periapical lucency. Multiple caries are present including the upper canine on the right, and left lower  premolar and molar teeth. No periapical lucency identified. IMPRESSION: Multiple caries. Electronically Signed   By: Franchot Gallo M.D.   On: 07/22/2020 16:16   DG Chest 2 View  Result Date: 07/13/2020 CLINICAL DATA:  Shortness of breath EXAM: CHEST - 2 VIEW COMPARISON:  07/26/2018 FINDINGS: Low lung volumes. Bibasilar airspace opacities. Mild cardiomegaly. No effusions. No acute bony abnormality. IMPRESSION: Low lung volumes with bibasilar airspace opacities concerning for pneumonia. Mild cardiomegaly. Electronically Signed   By: Rolm Baptise M.D.   On: 07/13/2020 11:33   US RENAL  Result Date: 07/17/2020 CLINICAL DATA:  Bilateral flank pain.  Substance abuse. EXAM: RENAL / URINARY TRACT ULTRASOUND COMPLETE COMPARISON:  None. FINDINGS: Right Kidney: Renal measurements: 11.2 x 4.3 x 5.9 cm = volume: 145 mL. Echogenicity within normal limits. No mass or hydronephrosis visualized. Left Kidney: Renal measurements: 12.5 x 5.9 x 5.1 cm = volume: 196.3 mL. Echogenicity within normal limits. No mass or hydronephrosis visualized. Bladder: Not visualized. Other: None. IMPRESSION: 1. The kidneys are normal in appearance. The mild right hydronephrosis seen on an abdominal ultrasound from July 13, 2020 is not visualized today. Electronically Signed   By: Dorise Bullion III M.D   On: 07/17/2020 14:53   DG Chest Port 1 View  Result Date: 07/15/2020 CLINICAL DATA:  Abnormal respiration EXAM: PORTABLE CHEST 1 VIEW COMPARISON:  07/13/2020 FINDINGS: Pulmonary insufflation has slightly improved and is symmetric. Superimposed multifocal pulmonary infiltrates, however, have progressed slightly in the interval, most in keeping with atypical infection or inflammation. No pneumothorax or pleural effusion. Cardiac size is at the upper limits of normal. Harrington rods again noted. IMPRESSION: Sided renal improvement in pulmonary insufflation. Progressive multifocal pulmonary infiltrates most in keeping with atypical infection or  inflammation. Electronically Signed   By: Fidela Salisbury MD   On: 07/15/2020 06:03   DG Foot Complete Right  Result Date: 07/15/2020 CLINICAL DATA:  RIGHT foot pain EXAM: RIGHT FOOT COMPLETE -  3+ VIEW COMPARISON:  None FINDINGS: Osseous mineralization normal. Joint spaces preserved. No acute fracture, dislocation, or bone destruction. IMPRESSION: Normal exam. Electronically Signed   By: Lavonia Dana M.D.   On: 07/15/2020 15:08   [place] MFM FETAL BPP WO NON STRESS  Result Date: 07/26/2020 ----------------------------------------------------------------------  OBSTETRICS REPORT                       (Signed Final 07/26/2020 01:37 pm) ---------------------------------------------------------------------- Patient Info  ID #:       [id number]                          D.O.B.:  Apr 17, 1986 (34 yrs)  Name:       Kelly [name]              Visit Date: 07/26/2020 10:33 am ---------------------------------------------------------------------- Performed By  Attending:        Sander Nephew      Ref. Address:     [place]                    MD                                                             Rd  Performed By:     Berlinda Last          [facility]                    [place]  Referred By:      Sloan Leiter                    MD ---------------------------------------------------------------------- Orders  #  Description                           Code        Ordered By  1  [place] MFM FETAL BPP WO NON               41740.81    Helen ----------------------------------------------------------------------  #  Order #                     Accession #                Episode #  1  [id number]                   [id number]                 [id number] ---------------------------------------------------------------------- Indications  Heart disease in mother affecting pregnancy    O99.413  in third trimester  [redacted] weeks gestation of pregnancy                 Z3A.28  Hypertension - Chronic/Pre-existing            O10.019  Drug use  complicating pregnancy, third         O72.323  trimester  Advanced maternal age multigravida 4+,        O70.523  third trimester  Previous cesarean delivery, antepartum x1      O34.219 ---------------------------------------------------------------------- Fetal Evaluation  Num Of Fetuses:         1  Fetal Heart Rate(bpm):  150  Cardiac Activity:       Observed  Presentation:           Cephalic  Placenta:               Anterior  Amniotic Fluid  AFI FV:      Within normal limits  AFI Sum(cm)     %Tile       Largest Pocket(cm)  9.77            8           3.31  RUQ(cm)       RLQ(cm)       LUQ(cm)        LLQ(cm)  2.86          1.56          2.04           3.31 ---------------------------------------------------------------------- Biophysical Evaluation  Amniotic F.V:   Within normal limits       F. Tone:        Observed  F. Movement:    Observed                   Score:          8/8  F. Breathing:   Observed ---------------------------------------------------------------------- OB History  Gravidity:    7         Term:   3        Prem:   0        SAB:   3  TOP:          0       Ectopic:  0        Living: 3 ---------------------------------------------------------------------- Gestational Age  Best:          28w 0d     Det. By:  U/S  (07/13/20)          EDD:   10/18/20 ---------------------------------------------------------------------- Anatomy  Abdomen:               Appears normal         Bladder:                Appears normal ---------------------------------------------------------------------- Impression  Antenatal testing performed, inpatient for mangement of CHF  for tricuspid endocarditis with septic emboli  The biophysical profile was 8/8 with good fetal movement and  amniotic fluid volume. ---------------------------------------------------------------------- Recommendations  Consider NST/AFI instead of BPP unless NST is  nonreactive,  given fetal gestational age.  Continue weekly testing.  Serial growth every 4 weeks.  Management per inpatient providers ----------------------------------------------------------------------               Sander Nephew, MD Electronically Signed Final Report   07/26/2020 01:37 pm ----------------------------------------------------------------------  ECHOCARDIOGRAM LIMITED  Result Date: 07/13/2020    ECHOCARDIOGRAM REPORT   Patient Name:   WAJIHA VERSTEEG Date of Exam: 07/13/2020 Medical Rec #:  [id number]          Height:       67.5 in Accession #:    [id number]  Weight:       177.0 lb Date of Birth:  Dec 14, 1985           BSA:          1.931 m Patient Age:    34 years           BP:           128/72 mmHg Patient Gender: F                  HR:           125 bpm. Exam Location:  Inpatient Procedure: Limited Color Doppler and Cardiac Doppler STAT ECHO Indications:     endocarditis  History:         Patient has prior history of Echocardiogram examinations, most                  recent 05/21/2019. Signs/Symptoms:Bacteremia, Dyspnea and Chest                  Pain; Risk Factors:IV drug use.  Sonographer:     Johny Chess Referring Phys:  [place] Diagnosing Phys: Loralie Champagne MD IMPRESSIONS  1. Left ventricular ejection fraction, by estimation, is 55%. The left ventricle has normal function. The left ventricle has no regional wall motion abnormalities. Left ventricular diastolic parameters are indeterminate.  2. Right ventricular systolic function is moderately reduced. The right ventricular size is severely enlarged. There is severely elevated pulmonary artery systolic pressure. The estimated right ventricular systolic pressure is 03.4 mmHg.  3. Right atrial size was severely dilated.  4. A small pericardial effusion is present.  5. The mitral valve is normal in structure. No evidence of mitral valve regurgitation. No evidence of mitral stenosis.  6. The tricuspid valve is  abnormal. Tricuspid valve regurgitation is severe.  7. The aortic valve is tricuspid. Aortic valve regurgitation is not visualized. No aortic stenosis is present.  8. The inferior vena cava is dilated in size with <50% respiratory variability, suggesting right atrial pressure of 15 mmHg.  9. No definite endocarditis was visualized. However, the right heart was dilated and dysfunctional with severe pulmonary hypertension and severe TR. FINDINGS  Left Ventricle: Left ventricular ejection fraction, by estimation, is 55%. The left ventricle has normal function. The left ventricle has no regional wall motion abnormalities. The left ventricular internal cavity size was normal in size. There is no left ventricular hypertrophy. Left ventricular diastolic parameters are indeterminate. Right Ventricle: The right ventricular size is severely enlarged. No increase in right ventricular wall thickness. Right ventricular systolic function is moderately reduced. There is severely elevated pulmonary artery systolic pressure. The tricuspid regurgitant velocity is 4.40 m/s, and with an assumed right atrial pressure of 15 mmHg, the estimated right ventricular systolic pressure is 74.2 mmHg. Left Atrium: Left atrial size was normal in size. Right Atrium: Right atrial size was severely dilated. Pericardium: A small pericardial effusion is present. Mitral Valve: The mitral valve is normal in structure. No evidence of mitral valve regurgitation. No evidence of mitral valve stenosis. Tricuspid Valve: The tricuspid valve is abnormal. Tricuspid valve regurgitation is severe. Aortic Valve: The aortic valve is tricuspid. Aortic valve regurgitation is not visualized. No aortic stenosis is present. Pulmonic Valve: The pulmonic valve was normal in structure. Pulmonic valve regurgitation is not visualized. Aorta: The aortic root is normal in size and structure. Venous: The inferior vena cava is dilated in size with less than 50% respiratory  variability, suggesting right atrial  pressure of 15 mmHg. IAS/Shunts: No atrial level shunt detected by color flow Doppler.  IVC IVC diam: 2.20 cm RIGHT ATRIUM           Index RA Area:     32.10 cm RA Volume:   136.00 ml 70.45 ml/m  TRICUSPID VALVE TR Peak grad:   77.4 mmHg TR Vmax:        440.00 cm/s Loralie Champagne MD Electronically signed by Loralie Champagne MD Signature Date/Time: 07/13/2020/5:19:31 PM    Final (Updated)    [place] EKG SITE RITE  Result Date: 07/23/2020 If Site Rite image not attached, placement could not be confirmed due to current cardiac rhythm.  [place] MFM OB COMP + 14 WK  Result Date: 07/13/2020 ----------------------------------------------------------------------  OBSTETRICS REPORT                       (Signed Final 07/13/2020 02:48 pm) ---------------------------------------------------------------------- Patient Info  ID #:       [id number]                          D.O.B.:  05-11-1986 (34 yrs)  Name:       Kelly [name]              Visit Date: 07/13/2020 02:08 pm ---------------------------------------------------------------------- Performed By  Attending:        Sander Nephew      Ref. Address:     [facility]                    MD                                                             GYN  Performed By:     Valda Favia          Location[facility]         [facility]                    [place]  Referred By:      Annalee Genta DO ---------------------------------------------------------------------- Orders  #  Description                           Code        Ordered By  1  [place] MFM OB COMP + 14 WK                32992.42    Janyth Pupa ----------------------------------------------------------------------  #  Order #                     Accession #                Episode #  1  [id number]                   [id number]  [id number] ----------------------------------------------------------------------  Indications  [redacted] weeks gestation of pregnancy                Z3A.26  Encounter for uncertain dates                  D32.67  Drug use complicating pregnancy, third         O80.323  trimester  Advanced maternal age multigravida 56+,        O16.523  third trimester  Previous cesarean delivery, antepartum x1      O34.219  Abdominal pain in pregnancy                    O99.89 ---------------------------------------------------------------------- Fetal Evaluation  Num Of Fetuses:         1  Fetal Heart Rate(bpm):  169  Cardiac Activity:       Observed  Presentation:           Breech  Placenta:               Anterior  P. Cord Insertion:      Not well visualized  Amniotic Fluid  AFI FV:      Within normal limits                              Largest Pocket(cm)                              5.6 ---------------------------------------------------------------------- Biometry  BPD:      64.8  mm     G. Age:  26w 1d         41  %    CI:        75.49   %    70 - 86                                                          FL/HC:      18.8   %    18.6 - 20.4  HC:      236.5  mm     G. Age:  25w 5d         14  %    HC/AC:      1.01        1.04 - 1.22  AC:      234.2  mm     G. Age:  27w 5d         86  %    FL/BPD:     68.7   %    71 - 87  FL:       44.5  mm     G. Age:  24w 5d          5  %    FL/AC:      19.0   %    20 - 24  HUM:      42.2  mm     G. Age:  25w 3d         27  %  Est. FW:     930  gm      2 lb 1 oz     49  % ----------------------------------------------------------------------  OB History  Gravidity:    7         Term:   3        Prem:   0        SAB:   3  TOP:          0       Ectopic:  0        Living: 3 ---------------------------------------------------------------------- Gestational Age  U/S Today:     26w 1d                                        EDD:   10/18/20  Best:          26w 1d     Det. By:  U/S (07/13/20)           EDD:   10/18/20 ----------------------------------------------------------------------  Anatomy  Cranium:               Appears normal         LVOT:                   Not well visualized  Cavum:                 Appears normal         Aortic Arch:            Not well visualized  Ventricles:            Not well visualized    Ductal Arch:            Not well visualized  Choroid Plexus:        Appears normal         Diaphragm:              Appears normal  Cerebellum:            Not well visualized    Stomach:                Appears normal, left                                                                        sided  Posterior Fossa:       Not well visualized    Abdomen:                Appears normal  Nuchal Fold:           Not applicable (>81    Abdominal Wall:         Not well visualized                         wks GA)  Face:                  Not well visualized    Cord Vessels:           Appears normal (3  vessel cord)  Lips:                  Not well visualized    Kidneys:                Not well visualized  Palate:                Not well visualized    Bladder:                Appears normal  Thoracic:              Appears normal         Spine:                  Not well visualized  Heart:                 Not well visualized    Upper Extremities:      Visualized  RVOT:                  Not well visualized    Lower Extremities:      Visualized  Other:  Fetus appears to be a female. Technically difficult due to maternal          habitus and fetal position. ---------------------------------------------------------------------- Cervix Uterus Adnexa  Cervix  Not visualized (advanced GA >24wks)  Uterus  No abnormality visualized.  Right Ovary  No adnexal mass visualized.  Left Ovary  No adnexal mass visualized.  Cul De Sac  No free fluid seen.  Adnexa  No abnormality visualized. ---------------------------------------------------------------------- Impression  Single intrauterine pregnancy here for a detailed anatomy  due new onset  abdominal pain and known substance abuse (  Heroine).  Normal anatomy with measurements consistent with today's  examination, Ms. Umar has an uncertain LMP.  There is good fetal movement and amniotic fluid volume  Suboptimal views of the fetal anatomy were obtained  secondary to fetal position and maternal ability to lay flat. ---------------------------------------------------------------------- Recommendations  Repeat growth and anatomy in 3 weeks. ----------------------------------------------------------------------               Sander Nephew, MD Electronically Signed Final Report   07/13/2020 02:48 pm ----------------------------------------------------------------------  [place] MFM OB LIMITED  Result Date: 07/20/2020 ----------------------------------------------------------------------  OBSTETRICS REPORT                       (Signed Final 07/20/2020 05:40 pm) ---------------------------------------------------------------------- Patient Info  ID #:       [id number]                          D.O.B.:  17-Nov-1985 (34 yrs)  Name:       Kelly [name]              Visit Date: 07/20/2020 11:46 am ---------------------------------------------------------------------- Performed By  Attending:        Johnell Comings MD         Ref. Address:     [address]  [place], [place]  Performed By:     Berlinda Last          [facility]  Referred By:      Osborne Oman MD ---------------------------------------------------------------------- Orders  #  Description                           Code        Ordered By  1  [place] MFM OB LIMITED                     [id number]    Verita Schneiders  ----------------------------------------------------------------------  #  Order #                     Accession #                Episode #  1  [id number]                   [id number]                 [id number] ---------------------------------------------------------------------- Indications  Drug use complicating pregnancy, second        O99.322  trimester  [redacted] weeks gestation of pregnancy                Z3A.27  Advanced maternal age multigravida 41+,        O96.522  second trimester  Previous cesarean delivery, antepartum x1      O34.219  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 Cord Insertion:      Marginal insertion  Amniotic Fluid  AFI FV:      Within normal limits                              Largest Pocket(cm)                              5.2 ---------------------------------------------------------------------- OB History  Gravidity:    7  Term:   3        Prem:   0        SAB:   3  TOP:          0       Ectopic:  0        Living: 3 ---------------------------------------------------------------------- Gestational Age  Best:          27w 1d     Det. By:  U/S  (07/13/20)          EDD:   10/18/20 ---------------------------------------------------------------------- Anatomy  Stomach:               Appears normal, left   Bladder:                Appears normal                         sided ---------------------------------------------------------------------- Cervix Uterus Adnexa  Cervix  Not visualized (advanced GA >24wks)  Adnexa  No abnormality visualized. 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  She is currently  treated with vancomycin and the plan is for her to complete a  6-week course of vancomycin as an inpatient.  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  She  is being followed by the heart failure team.  A limited ultrasound performed today shows that the fetus is  in the breech presentation.  There was normal amniotic fluid noted.  During the performance of her ultrasound today, a possible  dip in the fetal heart rate down to the low 100s was noted on  the M-mode Dopplers.  The actual heart rate was difficult to  decipher based on the M mode Doppler exam.  The patient cooperated with fetal heart rate monitoring this  afternoon.  The baseline fetal heart rate was noted to be in  the 140s, with moderate variability and small accelerations.  The heart rate tracing was reassuring for her gestational age.  Due to the reported risk of fetal mortality (10% to 15%) in  pregnant women with infectious endocarditis, the plan will be  to continue daily fetal heart rate monitoring for 20 minutes  daily while she is hospitalized (if patient is cooperative).  We  will start weekly biophysical profiles and amniotic fluid checks  at 28 weeks. ----------------------------------------------------------------------                   Johnell Comings, MD Electronically Signed Final Report   07/20/2020 05:40 pm ----------------------------------------------------------------------  US Abdomen Limited RUQ (LIVER/GB)  Result Date: 07/13/2020 CLINICAL DATA:  Abdomen pain pregnant patient EXAM: ULTRASOUND ABDOMEN LIMITED RIGHT UPPER QUADRANT COMPARISON:  CT 01/15/2019 FINDINGS: Gallbladder: Surgically absent Common bile duct: Diameter: 3.4 mm Liver: No focal lesion identified. Within normal limits in parenchymal echogenicity.  Portal vein is patent on color Doppler imaging with normal direction of blood flow towards the liver. Other: Mild right hydronephrosis. IMPRESSION: 1. Status post cholecystectomy. 2. Mild right hydronephrosis Electronically Signed   By: Donavan Foil M.D.   On: 07/13/2020 22:14    Lab Data:  CBC: Recent Labs  Lab 07/22/20 0505 07/23/20 0200 07/24/20 0348 07/26/20 0557 07/27/20 0356  WBC 16.6* 15.8* 12.4* 11.3* 10.8*  HGB  10.7* 9.8* 9.6* 9.6* 9.3*  HCT 33.0* 30.3* 28.6* 28.9* 27.9*  MCV 90.9 90.7 89.7 90.0 89.7  PLT 466* 437* 442* 464* 335*   Basic Metabolic Panel: Recent Labs  Lab 07/22/20 0939 07/23/20 0200 07/24/20 0348 07/25/20 0939 07/26/20 0557 07/27/20 0356  NA  --  129* 130* 133* 132* 134*  K  --  4.2 3.7 4.4 4.3 4.2  CL  --  94* 97* 95* 96* 98  CO2  --  26 24 29 29 28   GLUCOSE  --  76 83 93 88 105*  BUN  --  17 13 11 12 16   CREATININE  --  0.52 0.48 0.50 0.51 0.46  CALCIUM  --  9.4 9.1 9.2 9.4 9.4  MG 1.9  --   --   --  1.8  --    GFR: Estimated Creatinine Clearance: 108.8 mL/min (by C-G formula based on SCr of 0.46 mg/dL). Liver Function Tests: No results for input(s): AST, ALT, ALKPHOS, BILITOT, PROT, ALBUMIN in the last 168 hours. No results for input(s): LIPASE, AMYLASE in the last 168 hours. No results for input(s): AMMONIA in the last 168 hours. Coagulation Profile: No results for input(s): INR, PROTIME in the last 168 hours. Cardiac Enzymes: No results for input(s): CKTOTAL, CKMB, CKMBINDEX, TROPONINI in the last 168 hours. BNP (last 3 results) No results for input(s): PROBNP in the last 8760 hours. HbA1C: No results for input(s): HGBA1C in the last 72 hours. CBG: Recent Labs  Lab 07/21/20 1210 07/25/20 0724  GLUCAP 121* 93   Lipid Profile: No results for input(s): CHOL, HDL, LDLCALC, TRIG, CHOLHDL, LDLDIRECT in the last 72 hours. Thyroid Function Tests: No results for input(s): TSH, T4TOTAL, FREET4, T3FREE, THYROIDAB in the last 72  hours. Anemia Panel: No results for input(s): VITAMINB12, FOLATE, FERRITIN, TIBC, IRON, RETICCTPCT in the last 72 hours. Urine analysis:    Component Value Date/Time   COLORURINE AMBER (A) 07/13/2020 1320   APPEARANCEUR CLOUDY (A) 07/13/2020 1320   APPEARANCEUR Hazy 07/10/2013 1121   LABSPEC 1.028 07/13/2020 1320   LABSPEC 1.020 07/10/2013 1121   PHURINE 5.0 07/13/2020 1320   GLUCOSEU NEGATIVE 07/13/2020 1320   GLUCOSEU Negative 07/10/2013 1121   HGBUR SMALL (A) 07/13/2020 1320   BILIRUBINUR NEGATIVE 07/13/2020 1320   BILIRUBINUR Negative 07/10/2013 1121   KETONESUR 5 (A) 07/13/2020 1320   PROTEINUR 100 (A) 07/13/2020 1320   UROBILINOGEN 1.0 02/15/2015 0530   NITRITE POSITIVE (A) 07/13/2020 1320   LEUKOCYTESUR LARGE (A) 07/13/2020 1320   LEUKOCYTESUR Trace 07/10/2013 1121     Ripudeep Rai M.D. [facility] 07/27/2020, 3:45 PM  Available via Epic secure chat 7am-7pm After 7 pm, please refer to night coverage provider listed on amion.

## 2020-07-27 NOTE — Progress Notes (Signed)
NST initiated.  No complaints of leaking or bleeding.  No cramping or cxns noted.  Patient reports good fetal movement.  In good spirits.

## 2020-07-27 NOTE — Progress Notes (Signed)
Subjective:  She is having pain in her jaw now also wwith infusion of any medications through [facility]  Antibiotics:  Anti-infectives (From admission, onward)   Start     Dose/Rate Route Frequency Ordered Stop   07/22/20 1045  amoxicillin-clavulanate (AUGMENTIN) 875-125 MG per tablet 1 tablet        1 tablet Oral Every 12 hours 07/22/20 0952 07/31/20 2359   07/18/20 0830  vancomycin (VANCOCIN) IVPB 750 mg/150 ml premix        750 mg 150 mL/hr over 60 Minutes Intravenous Every 8 hours 07/18/20 0750     07/16/20 0400  Vancomycin (VANCOCIN) 1,250 mg in sodium chloride 0.9 % 250 mL IVPB  Status:  Discontinued        1,250 mg 166.7 mL/hr over 90 Minutes Intravenous Every 8 hours 07/15/20 1944 07/18/20 0749   07/15/20 1715  penicillin g benzathine (BICILLIN LA) 1200000 UNIT/2ML injection 2.4 Million Units        2.4 Million Units Intramuscular  Once 07/15/20 1621 07/15/20 1747   07/14/20 0300  vancomycin (VANCOCIN) IVPB 1000 mg/200 mL premix        1,000 mg 200 mL/hr over 60 Minutes Intravenous Every 8 hours 07/13/20 1806 07/15/20 2059   07/13/20 1730  Vancomycin (VANCOCIN) 1,500 mg in sodium chloride 0.9 % 500 mL IVPB        1,500 mg 250 mL/hr over 120 Minutes Intravenous  Once 07/13/20 1709 07/13/20 2116   07/13/20 1730  ceFEPIme (MAXIPIME) 2 g in sodium chloride 0.9 % 100 mL IVPB  Status:  Discontinued        2 g 200 mL/hr over 30 Minutes Intravenous Every 8 hours 07/13/20 1709 07/14/20 1050   07/13/20 1315  cefTRIAXone (ROCEPHIN) 1 g in sodium chloride 0.9 % 100 mL IVPB        1 g 200 mL/hr over 30 Minutes Intravenous  Once 07/13/20 1306 07/13/20 1538   07/13/20 1315  azithromycin (ZITHROMAX) 500 mg in sodium chloride 0.9 % 250 mL IVPB        500 mg 250 mL/hr over 60 Minutes Intravenous  Once 07/13/20 1306 07/13/20 1644      Medications: Scheduled Meds: . sodium chloride   Intravenous Once  . acetaminophen  650 mg Oral Q6H  . amoxicillin-clavulanate  1 tablet Oral Q12H   . Chlorhexidine Gluconate Cloth  6 each Topical Daily  . enoxaparin (LOVENOX) injection  40 mg Subcutaneous Q24H  . feeding supplement  237 mL Oral BID BM  . furosemide  40 mg Oral Daily  . methadone  100 mg Oral Daily  . polyethylene glycol  17 g Oral BID  . prenatal vitamin w/FE, FA  1 tablet Oral Q1200  . senna-docusate  1 tablet Oral BID  . sodium chloride flush  10-40 mL Intracatheter Q12H  . [START ON 07/28/2020] Tdap  0.5 mL Intramuscular Once   Continuous Infusions: . vancomycin 750 mg (07/27/20 1530)   PRN Meds:.HYDROmorphone (DILAUDID) injection, magic mouthwash w/lidocaine, prochlorperazine, sodium chloride flush, sodium chloride flush    Objective: Weight change:   Intake/Output Summary (Last 24 hours) at 07/27/2020 2036 Last data filed at 07/27/2020 1000 Gross per 24 hour  Intake 1100 ml  Output --  Net 1100 ml   Blood pressure 108/72, pulse 83, temperature 97.9 F (36.6 C), temperature source Oral, resp. rate 18, height 5' 7.5" (1.715 m), weight 81.2 kg, SpO2 100 %, not currently breastfeeding. Temp:  [97.7 F (  36.5 C)-98.4 F (36.9 C)] 97.9 F (36.6 C) (03/16 1449) Pulse Rate:  [79-86] 83 (03/16 1449) Resp:  [16-18] 18 (03/16 1449) BP: (104-111)/(61-80) 108/72 (03/16 1449) SpO2:  [95 %-100 %] 100 % (03/16 1449) Weight:  [81.2 kg] 81.2 kg (03/16 0339)  Physical Exam: Physical Exam Constitutional:      General: She is not in acute distress.    Appearance: She is well-developed. She is not diaphoretic.  HENT:     Head: Normocephalic and atraumatic.     Right Ear: External ear normal.     Left Ear: External ear normal.     Mouth/Throat:     Dentition: Abnormal dentition. Gingival swelling present.     Pharynx: No oropharyngeal exudate.  Eyes:     General: No scleral icterus.    Conjunctiva/sclera: Conjunctivae normal.     Pupils: Pupils are equal, round, and reactive to light.  Cardiovascular:     Rate and Rhythm: Normal rate and regular rhythm.      Heart sounds: No murmur heard.   Pulmonary:     Effort: Pulmonary effort is normal. No respiratory distress.     Breath sounds: No wheezing.  Abdominal:     General: There is no distension.     Palpations: Abdomen is soft.  Musculoskeletal:        General: No tenderness. Normal range of motion.  Lymphadenopathy:     Cervical: No cervical adenopathy.  Skin:    General: Skin is warm and dry.     Coloration: Skin is not pale.     Findings: No erythema or rash.  Neurological:     Mental Status: She is alert and oriented to person, place, and time.     Motor: No abnormal muscle tone.     Coordination: Coordination normal.  Psychiatric:        Attention and Perception: Attention normal.        Mood and Affect: Mood normal. Mood is not anxious.        Speech: Speech normal.        Behavior: Behavior normal.        Thought Content: Thought content normal.        Cognition and Memory: Cognition normal.        Judgment: Judgment normal.      CBC:    BMET Recent Labs    07/26/20 0557 07/27/20 0356  NA 132* 134*  K 4.3 4.2  CL 96* 98  CO2 29 28  GLUCOSE 88 105*  BUN 12 16  CREATININE 0.51 0.46  CALCIUM 9.4 9.4     Liver Panel  No results for input(s): PROT, ALBUMIN, AST, ALT, ALKPHOS, BILITOT, BILIDIR, IBILI in the last 72 hours.     Sedimentation Rate No results for input(s): ESRSEDRATE in the last 72 hours. C-Reactive Protein No results for input(s): CRP in the last 72 hours.  Micro Results: Recent Results (from the past 720 hour(s))  Resp Panel by RT-PCR (Flu A&B, Covid) Nasopharyngeal Swab     Status: None   Collection Time: 07/13/20 11:21 AM   Specimen: Nasopharyngeal Swab; Nasopharyngeal(NP) swabs in vial transport medium  Result Value Ref Range Status   SARS Coronavirus 2 by RT PCR NEGATIVE NEGATIVE Final    Comment: (NOTE) SARS-CoV-2 target nucleic acids are NOT DETECTED.  The SARS-CoV-2 RNA is generally detectable in upper respiratory specimens  during the acute phase of infection. The lowest concentration of SARS-CoV-2 viral copies this assay can detect is  138 copies/mL. A negative result does not preclude SARS-Cov-2 infection and should not be used as the sole basis for treatment or other patient management decisions. A negative result may occur with  improper specimen collection/handling, submission of specimen other than nasopharyngeal swab, presence of viral mutation(s) within the areas targeted by this assay, and inadequate number of viral copies(<138 copies/mL). A negative result must be combined with clinical observations, patient history, and epidemiological information. The expected result is Negative.  Fact Sheet for Patients:  EntrepreneurPulse.com.au  Fact Sheet for Healthcare Providers:  IncredibleEmployment.be  This test is no t yet approved or cleared by the [place] FDA and  has been authorized for detection and/or diagnosis of SARS-CoV-2 by FDA under an Emergency Use Authorization (EUA). This EUA will remain  in effect (meaning this test can be used) for the duration of the COVID-19 declaration under Section 564(b)(1) of the Act, 21 U.S.C.section 360bbb-3(b)(1), unless the authorization is terminated  or revoked sooner.       Influenza A by PCR NEGATIVE NEGATIVE Final   Influenza B by PCR NEGATIVE NEGATIVE Final    Comment: (NOTE) The Xpert Xpress SARS-CoV-2/FLU/RSV plus assay is intended as an aid in the diagnosis of influenza from Nasopharyngeal swab specimens and should not be used as a sole basis for treatment. Nasal washings and aspirates are unacceptable for Xpert Xpress SARS-CoV-2/FLU/RSV testing.  Fact Sheet for Patients: EntrepreneurPulse.com.au  Fact Sheet for Healthcare Providers: IncredibleEmployment.be  This test is not yet approved or cleared by the [place] FDA and has been authorized for detection  and/or diagnosis of SARS-CoV-2 by FDA under an Emergency Use Authorization (EUA). This EUA will remain in effect (meaning this test can be used) for the duration of the COVID-19 declaration under Section 564(b)(1) of the Act, 21 U.S.C. section 360bbb-3(b)(1), unless the authorization is terminated or revoked.  Performed at [facility], [place] [address] [place], [place] [address]   Culture, blood (routine x 2)     Status: Abnormal   Collection Time: 07/13/20 12:20 PM   Specimen: BLOOD  Result Value Ref Range Status   Specimen Description BLOOD SITE NOT SPECIFIED  Final   Special Requests   Final    BOTTLES DRAWN AEROBIC AND ANAEROBIC Blood Culture adequate volume   Culture  Setup Time   Final    GRAM POSITIVE COCCI IN CHAINS IN BOTH AEROBIC AND ANAEROBIC BOTTLES Organism ID to follow CRITICAL RESULT CALLED TO, READ BACK BY AND VERIFIED WITH: Performed at [facility], [place] [address], [place], [place] [address]    Culture METHICILLIN RESISTANT STAPHYLOCOCCUS AUREUS (A)  Final   Report Status 07/16/2020 FINAL  Final   Organism ID, Bacteria METHICILLIN RESISTANT STAPHYLOCOCCUS AUREUS  Final      Susceptibility   Methicillin resistant staphylococcus aureus - MIC*    CIPROFLOXACIN <=0.5 SENSITIVE Sensitive     ERYTHROMYCIN >=8 RESISTANT Resistant     GENTAMICIN <=0.5 SENSITIVE Sensitive     OXACILLIN >=4 RESISTANT Resistant     TETRACYCLINE <=1 SENSITIVE Sensitive     VANCOMYCIN <=0.5 SENSITIVE Sensitive     TRIMETH/SULFA <=10 SENSITIVE Sensitive     CLINDAMYCIN <=0.25 SENSITIVE Sensitive     RIFAMPIN <=0.5 SENSITIVE Sensitive     Inducible Clindamycin NEGATIVE Sensitive     * METHICILLIN RESISTANT STAPHYLOCOCCUS AUREUS  Blood Culture ID Panel (Reflexed)     Status: Abnormal   Collection Time: 07/13/20 12:20 PM  Result Value Ref Range Status  Enterococcus faecalis NOT DETECTED NOT DETECTED Final   Enterococcus Faecium NOT DETECTED NOT DETECTED Final   Listeria  monocytogenes NOT DETECTED NOT DETECTED Final   Staphylococcus species DETECTED (A) NOT DETECTED Final    Comment: CRITICAL RESULT CALLED TO, READ BACK BY AND VERIFIED WITH: PHARMD AMEND CAREN BY MESSAN H. AT 0120 ON 07/14/2020    Staphylococcus aureus (BCID) DETECTED (A) NOT DETECTED Final    Comment: Methicillin (oxacillin)-resistant Staphylococcus aureus (MRSA). MRSA is predictably resistant to beta-lactam antibiotics (except ceftaroline). Preferred therapy is vancomycin unless clinically contraindicated. Patient requires contact precautions if  hospitalized. CRITICAL RESULT CALLED TO, READ BACK BY AND VERIFIED WITH: PHARMD AMEND CAREN BY MESSAN H. AT 0120 ON 07/14/2020    Staphylococcus epidermidis NOT DETECTED NOT DETECTED Final   Staphylococcus lugdunensis NOT DETECTED NOT DETECTED Final   Streptococcus species NOT DETECTED NOT DETECTED Final   Streptococcus agalactiae NOT DETECTED NOT DETECTED Final   Streptococcus pneumoniae NOT DETECTED NOT DETECTED Final   Streptococcus pyogenes NOT DETECTED NOT DETECTED Final   A.calcoaceticus-baumannii NOT DETECTED NOT DETECTED Final   Bacteroides fragilis NOT DETECTED NOT DETECTED Final   Enterobacterales NOT DETECTED NOT DETECTED Final   Enterobacter cloacae complex NOT DETECTED NOT DETECTED Final   Escherichia coli NOT DETECTED NOT DETECTED Final   Klebsiella aerogenes NOT DETECTED NOT DETECTED Final   Klebsiella oxytoca NOT DETECTED NOT DETECTED Final   Klebsiella pneumoniae NOT DETECTED NOT DETECTED Final   Proteus species NOT DETECTED NOT DETECTED Final   Salmonella species NOT DETECTED NOT DETECTED Final   Serratia marcescens NOT DETECTED NOT DETECTED Final   Haemophilus influenzae NOT DETECTED NOT DETECTED Final   Neisseria meningitidis NOT DETECTED NOT DETECTED Final   Pseudomonas aeruginosa NOT DETECTED NOT DETECTED Final   Stenotrophomonas maltophilia NOT DETECTED NOT DETECTED Final   Candida albicans NOT DETECTED NOT DETECTED Final    Candida auris NOT DETECTED NOT DETECTED Final   Candida glabrata NOT DETECTED NOT DETECTED Final   Candida krusei NOT DETECTED NOT DETECTED Final   Candida parapsilosis NOT DETECTED NOT DETECTED Final   Candida tropicalis NOT DETECTED NOT DETECTED Final   Cryptococcus neoformans/gattii NOT DETECTED NOT DETECTED Final   Meth resistant mecA/C and MREJ DETECTED (A) NOT DETECTED Final    Comment: CRITICAL RESULT CALLED TO, READ BACK BY AND VERIFIED WITH: PHARMD AMEND CAREN BY MESSAN H. AT 0120 ON 07/14/2020 Performed at [facility], [address] [place], [place] [address]   Culture, blood (routine x 2)     Status: Abnormal   Collection Time: 07/13/20 12:21 PM   Specimen: BLOOD  Result Value Ref Range Status   Specimen Description BLOOD SITE NOT SPECIFIED  Final   Special Requests   Final    BOTTLES DRAWN AEROBIC AND ANAEROBIC Blood Culture adequate volume   Culture  Setup Time   Final    GRAM POSITIVE COCCI IN BOTH AEROBIC AND ANAEROBIC BOTTLES CRITICAL VALUE NOTED.  VALUE IS CONSISTENT WITH PREVIOUSLY REPORTED AND CALLED VALUE.    Culture (A)  Final    STAPHYLOCOCCUS AUREUS SUSCEPTIBILITIES PERFORMED ON PREVIOUS CULTURE WITHIN THE LAST 5 DAYS. Performed at [facility], [place] [address], [place], [place] [address]    Report Status 07/16/2020 FINAL  Final  MRSA PCR Screening     Status: Abnormal   Collection Time: 07/13/20  8:37 PM   Specimen: Nasal Mucosa; Nasopharyngeal  Result Value Ref Range Status   MRSA by PCR POSITIVE (A) NEGATIVE Final  Comment:        The GeneXpert MRSA Assay (FDA approved for NASAL specimens only), is one component of a comprehensive MRSA colonization surveillance program. It is not intended to diagnose MRSA infection nor to guide or monitor treatment for MRSA infections. RESULT CALLED TO, READ BACK BY AND VERIFIED WITH: TOLER,M RN 07/13/2020 AT 2319 SKEEN,P Performed at [facility], [place] [address] [name], [place]  [address]   Urine culture     Status: None   Collection Time: 07/13/20 10:10 PM   Specimen: Urine, Clean Catch  Result Value Ref Range Status   Specimen Description URINE, CLEAN CATCH  Final   Special Requests NONE  Final   Culture   Final    NO GROWTH Performed at [facility], [address] [place], [place] [address]    Report Status 07/15/2020 FINAL  Final  Culture, blood (routine x 2)     Status: None   Collection Time: 07/15/20  6:49 PM   Specimen: BLOOD  Result Value Ref Range Status   Specimen Description BLOOD RIGHT ANTECUBITAL  Final   Special Requests   Final    BOTTLES DRAWN AEROBIC AND ANAEROBIC BACTEROIDES CACCAE   Culture   Final    NO GROWTH 5 DAYS Performed at [facility], [place] [address] [place], [place] [address]    Report Status 07/20/2020 FINAL  Final  Culture, blood (routine x 2)     Status: None   Collection Time: 07/15/20  6:50 PM   Specimen: BLOOD  Result Value Ref Range Status   Specimen Description BLOOD BLOOD RIGHT FOREARM  Final   Special Requests   Final    BOTTLES DRAWN AEROBIC AND ANAEROBIC Blood Culture adequate volume   Culture   Final    NO GROWTH 5 DAYS Performed at [facility], [place] [address], [place], [place] [address]    Report Status 07/20/2020 FINAL  Final    Studies/Results: [place] MFM FETAL BPP WO NON STRESS  Result Date: 07/26/2020 ----------------------------------------------------------------------  OBSTETRICS REPORT                       (Signed Final 07/26/2020 01:37 pm) ---------------------------------------------------------------------- Patient Info  ID #:       [id number]                          D.O.B.:  1986/01/29 (34 yrs)  Name:       [name]              Visit Date: 07/26/2020 10:33 am ---------------------------------------------------------------------- Performed By  Attending:        Sander Nephew      Ref. Address:     [place]                    MD                                                              Rd  Performed By:     Berlinda Last          [facility]  Referred By:      Sloan Leiter                    MD ---------------------------------------------------------------------- Orders  #  Description                           Code        Ordered By  1  [place] MFM FETAL BPP WO NON               76819.01    KELLY DAVIS     STRESS ----------------------------------------------------------------------  #  Order #                     Accession #                Episode #  1  [id number]                   [id number]                 [id number] ---------------------------------------------------------------------- Indications  Heart disease in mother affecting pregnancy    O99.413  in third trimester  [redacted] weeks gestation of pregnancy                Z3A.28  Hypertension - Chronic/Pre-existing            I45.809  Drug use complicating pregnancy, third         O27.323  trimester  Advanced maternal age multigravida 38+,        O10.523  third trimester  Previous cesarean delivery, antepartum x1      O34.219 ---------------------------------------------------------------------- Fetal Evaluation  Num Of Fetuses:         1  Fetal Heart Rate(bpm):  150  Cardiac Activity:       Observed  Presentation:           Cephalic  Placenta:               Anterior  Amniotic Fluid  AFI FV:      Within normal limits  AFI Sum(cm)     %Tile       Largest Pocket(cm)  9.77            8           3.31  RUQ(cm)       RLQ(cm)       LUQ(cm)        LLQ(cm)  2.86          1.56          2.04           3.31 ---------------------------------------------------------------------- Biophysical Evaluation  Amniotic F.V:   Within normal limits       F. Tone:        Observed  F. Movement:    Observed                   Score:          8/8  F. Breathing:   Observed ---------------------------------------------------------------------- OB History   Gravidity:    7         Term:   3  Prem:   0        SAB:   3  TOP:          0       Ectopic:  0        Living: 3 ---------------------------------------------------------------------- Gestational Age  Best:          28w 0d     Det. By:  U/S  (07/13/20)          EDD:   10/18/20 ---------------------------------------------------------------------- Anatomy  Abdomen:               Appears normal         Bladder:                Appears normal ---------------------------------------------------------------------- Impression  Antenatal testing performed, inpatient for mangement of CHF  for tricuspid endocarditis with septic emboli  The biophysical profile was 8/8 with good fetal movement and  amniotic fluid volume. ---------------------------------------------------------------------- Recommendations  Consider NST/AFI instead of BPP unless NST is nonreactive,  given fetal gestational age.  Continue weekly testing.  Serial growth every 4 weeks.  Management per inpatient providers ----------------------------------------------------------------------               Sander Nephew, MD Electronically Signed Final Report   07/26/2020 01:37 pm ----------------------------------------------------------------------  VAS [place] UPPER EXTREMITY VENOUS DUPLEX  Result Date: 07/27/2020 UPPER VENOUS STUDY  Indications: Pain when arm is lifted above head with PICC line Other Indications: IVDU. Risk Factors: None identified. Anticoagulation: Lovenox. Limitations: PICC line upper arm. Comparison Study: Prev 9/19 Negative LLE Performing Technologist: Vonzell Schlatter RVT  Examination Guidelines: A complete evaluation includes B-mode imaging, spectral Doppler, color Doppler, and power Doppler as needed of all accessible portions of each vessel. Bilateral testing is considered an integral part of a complete examination. Limited examinations for reoccurring indications may be performed as noted.  Right Findings:  +----------+------------+---------+-----------+----------+-------+ RIGHT     CompressiblePhasicitySpontaneousPropertiesSummary +----------+------------+---------+-----------+----------+-------+ IJV           Full       Yes       Yes                      +----------+------------+---------+-----------+----------+-------+ Subclavian    Full       Yes       Yes                      +----------+------------+---------+-----------+----------+-------+ Axillary      Full       Yes       Yes                      +----------+------------+---------+-----------+----------+-------+ Brachial      Full       Yes       Yes                      +----------+------------+---------+-----------+----------+-------+ Radial        Full                                          +----------+------------+---------+-----------+----------+-------+ Ulnar         Full                                          +----------+------------+---------+-----------+----------+-------+  Cephalic      Full                                          +----------+------------+---------+-----------+----------+-------+ Basilic       Full                                          +----------+------------+---------+-----------+----------+-------+  Summary:  Right: No evidence of deep vein thrombosis in the upper extremity. No evidence of superficial vein thrombosis in the upper extremity. However, unable to visualize the Brachial mid-distal due to PICC.  *See table(s) above for measurements and observations.    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)   Tricuspid regurgitation   Preexisting hypertension complicating pregnancy, antepartum   Dental caries   Endocarditis of tricuspid valve   History of VBAC   Lumbar back pain   Acute on chronic congestive heart failure (HCC)   Cellulitis of right lower extremity   Septic embolism (HCC)   Syphilis   [redacted] weeks gestation of pregnancy   Flank pain   Drug use affecting pregnancy in second trimester   Maternal endocarditis affecting pregnancy in second trimester, antepartum   Methadone maintenance treatment affecting pregnancy ([place])   Heart disease in mother complicating pregnancy   Pain, dental    Ebonye LORETHA URE is a 35 y.o. female with admission yet again with right-sided endocarditis with MRSA and septic emboli to the lungs we suspect.  While she does not have overt vegetations on transthoracic echocardiogram I am confident that she has had them and they have embolized into her lungs.  She also has low back pain which could be related to septic embolization to her pregnancy or could be due to pathology in her lumbar spine.  Fortunately her blood cultures have cleared  We will continue vancomycin  And she is agreeable to staying int he hospital not just to complete her antibiotics but also to deliver her baby  Dental infection added Augmentin  And would give at least 10 days if not 14 and ensure dental is going to see her and take care of the source of her pain  She told me that Dentist saw her and would consider removing teeth causing pain while she is in the hospital. 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  I spent greater than 35  minutes with the patient including greater than 50% of time in face to face counsel of the patient re problems mentioned  above and in coordination of their care.  She should again receive 6 weeks of IV vancomycin post clearance of blood cultures which I believe would be through April 14th.  I will sign off for now but please call us back to see her as she nears completion of her antibiotics.          LOS: 14 days   Alcide Evener 07/27/2020, 8:36 PM

## 2020-07-28 DIAGNOSIS — R1010 Upper abdominal pain, unspecified: Secondary | ICD-10-CM | POA: Diagnosis not present

## 2020-07-28 DIAGNOSIS — N3000 Acute cystitis without hematuria: Secondary | ICD-10-CM

## 2020-07-28 DIAGNOSIS — R7881 Bacteremia: Secondary | ICD-10-CM | POA: Diagnosis not present

## 2020-07-28 DIAGNOSIS — I5023 Acute on chronic systolic (congestive) heart failure: Secondary | ICD-10-CM | POA: Diagnosis not present

## 2020-07-28 LAB — GLUCOSE, 1 HOUR GESTATIONAL: Glucose Tolerance, 1 hour: 78 mg/dL (ref 70–189)

## 2020-07-28 LAB — URINE CYTOLOGY ANCILLARY ONLY
Comment: NEGATIVE
Trichomonas: NEGATIVE

## 2020-07-28 LAB — GLUCOSE, FASTING GESTATIONAL: Glucose Tolerance, Fasting: 86 mg/dL

## 2020-07-28 LAB — GLUCOSE, 2 HOUR GESTATIONAL: Glucose Tolerance, 2 hour: 83 mg/dL (ref 70–164)

## 2020-07-28 MED ORDER — LORAZEPAM 2 MG/ML IJ SOLN: 1.0000 mg | Freq: Once | INTRAMUSCULAR | Status: DC

## 2020-07-28 MED ORDER — METHADONE HCL 10 MG PO TABS
100.0000 mg | ORAL_TABLET | Freq: Once | ORAL | Status: AC
Start: 2020-07-28 — End: 2020-07-28
  Administered 2020-07-28: 100 mg via ORAL

## 2020-07-28 MED ORDER — METHAZOLAMIDE 50 MG PO TABS: 100.0000 mg | ORAL_TABLET | Freq: Once | ORAL | Status: DC

## 2020-07-28 NOTE — Progress Notes (Signed)
Patient was scheduled to have 75grams Glucola drink this morning. Notified OBGYN on call at [phone] to notified her that main pharmacy does not carry Glucola. Per [facility] also does not carry drink. Materials called and they also do not carry Glucola. Unable to get drink at this time. MD is working on getting. Fasting glucose sent to lab.

## 2020-07-28 NOTE — Progress Notes (Signed)
[facility] Consult Progress Note                                                                               Patient Demographics  Kelly [name], is a 35 y.o. female, DOB - 1985-06-09, RJJ:[id number]  Admit date - 07/13/2020   Admitting Physician Kipp Brood, MD  Outpatient Primary MD for the patient is Pa, [facility]  Outpatient specialists:   LOS - 15  days   Medical records reviewed and are as summarized below:    Chief Complaint  Patient presents with  . 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 -Current active IV heroin user, uses about 3 g daily and IV fentanyl, presented to the ED with 3 to 4 weeks history of bilateral lower extremity edema associated with worsening dyspnea on exertion for 4 days, intermittent fever for several weeks and pain with deep inspiration across her right lateral chest and flank. -In the ED she was noted to be fluid overload, blood cultures were positive for MRSA and she was a started on IV vancomycin. -2D echo without obvious vegetation, TEE was deferred and plan is to treat empirically for endocarditis -Patient was also found to be 26 weeks Pregnancy. Currently 28 weeks pregnancy.    Assessment & Plan   28 weeks pregnancy -Management per primary service, OB  MRSA bacteremia with presumed endocarditis -Per ID recommendations, continue IV vancomycin -2D echo without obvious vegetations, TEE deferred as empirically treating as endocarditis -Repeat blood cultures so far negative -Continue IV vancomycin x 6 weeks, afebrile   Acute on chronic right-sided heart failure with severe TR, severe pulmonary hypertension -2D echo showed decreased  RV systolic function, elevated PA pressure 92, severe TR -Was followed by cardiology/CHF team, transition to Lasix oral on 3/12, continue Lasix 40 mg daily  -Negative balance of 11.4 L  Anemia of chronic disease -Baseline hemoglobin 8.5-9.5 - B12 445, folate 7.4, iron 32, ferritin 111 -Continue prenatal vitamins, H&H stable  History of IV heroin and fentanyl abuse, chronic pain syndrome -Patient now started on methadone 100 mg daily on 3/15, she has previously tolerated 100 mg of methadone daily -Dilaudid decreased to 0.5- 1 mg every 4 hours as needed for severe/breakthrough pain -Continue monitoring pulse O2, QTc -HCV antibody+, hep A, hep B negative  Pneumonia, possibly septic pulmonary infarcts -Continue IV vancomycin  Syphilis -Positive RPR with rash per ID, could be secondary syphilis -Penicillin 2.4MU IM given  Hyponatremia -Improving  Back pain -Unable to tolerate MRI, was discontinued, currently on IV antibiotics  Jaw pain, tooth pain, dental caries -Placed on Augmentin, multiple caries, outpatient follow-up with [facility]   Code Status: Full CODE STATUS DVT Prophylaxis:  enoxaparin (LOVENOX) injection 40 mg Start: 07/25/20 1700 Place TED hose Start: 07/13/20 1900 SCDs Start: 07/13/20 1653   Level of Care: Level of care: Progressive Family Communication: Discussed  all imaging results, lab results, explained to the patient   Disposition Plan:     Status is: Inpatient   Time Spent in minutes 25 minutes   Antimicrobials:   Anti-infectives (From admission, onward)   Start     Dose/Rate Route Frequency Ordered Stop   07/22/20 1045  amoxicillin-clavulanate (AUGMENTIN) 875-125 MG per tablet 1 tablet        1 tablet Oral Every 12 hours 07/22/20 0952 07/31/20 2359   07/18/20 0830  vancomycin (VANCOCIN) IVPB 750 mg/150 ml premix        750 mg 150 mL/hr over 60 Minutes Intravenous Every 8 hours 07/18/20 0750     07/16/20 0400  Vancomycin  (VANCOCIN) 1,250 mg in sodium chloride 0.9 % 250 mL IVPB  Status:  Discontinued        1,250 mg 166.7 mL/hr over 90 Minutes Intravenous Every 8 hours 07/15/20 1944 07/18/20 0749   07/15/20 1715  penicillin g benzathine (BICILLIN LA) 1200000 UNIT/2ML injection 2.4 Million Units        2.4 Million Units Intramuscular  Once 07/15/20 1621 07/15/20 1747   07/14/20 0300  vancomycin (VANCOCIN) IVPB 1000 mg/200 mL premix        1,000 mg 200 mL/hr over 60 Minutes Intravenous Every 8 hours 07/13/20 1806 07/15/20 2059   07/13/20 1730  Vancomycin (VANCOCIN) 1,500 mg in sodium chloride 0.9 % 500 mL IVPB        1,500 mg 250 mL/hr over 120 Minutes Intravenous  Once 07/13/20 1709 07/13/20 2116   07/13/20 1730  ceFEPIme (MAXIPIME) 2 g in sodium chloride 0.9 % 100 mL IVPB  Status:  Discontinued        2 g 200 mL/hr over 30 Minutes Intravenous Every 8 hours 07/13/20 1709 07/14/20 1050   07/13/20 1315  cefTRIAXone (ROCEPHIN) 1 g in sodium chloride 0.9 % 100 mL IVPB        1 g 200 mL/hr over 30 Minutes Intravenous  Once 07/13/20 1306 07/13/20 1538   07/13/20 1315  azithromycin (ZITHROMAX) 500 mg in sodium chloride 0.9 % 250 mL IVPB        500 mg 250 mL/hr over 60 Minutes Intravenous  Once 07/13/20 1306 07/13/20 1644         Medications  Scheduled Meds: . sodium chloride   Intravenous Once  . acetaminophen  650 mg Oral Q6H  . amoxicillin-clavulanate  1 tablet Oral Q12H  . Chlorhexidine Gluconate Cloth  6 each Topical Daily  . enoxaparin (LOVENOX) injection  40 mg Subcutaneous Q24H  . feeding supplement  237 mL Oral BID BM  . furosemide  40 mg Oral Daily  . methadone  100 mg Oral Daily  . polyethylene glycol  17 g Oral BID  . prenatal vitamin w/FE, FA  1 tablet Oral Q1200  . senna-docusate  1 tablet Oral BID  . sodium chloride flush  10-40 mL Intracatheter Q12H   Continuous Infusions: . vancomycin 750 mg (07/28/20 0855)   PRN Meds:.HYDROmorphone (DILAUDID) injection, magic mouthwash  w/lidocaine, prochlorperazine, sodium chloride flush, sodium chloride flush      Subjective:   Kelly [name] was seen and examined today.  No complaints, resting comfortably.  No chest pain, shortness of breath, dizziness nausea vomiting.  No acute events overnight.  Afebrile   Objective:   Vitals:   07/27/20 0842 07/27/20 1449 07/27/20 2220 07/28/20 0847  BP: 111/80 108/72 109/69 117/87  Pulse: 86 83 100 99  Resp: 17 18 20 20   Temp:  97.7 F (36.5 C) 97.9 F (36.6 C) 98 F (36.7 C) 98.1 F (36.7 C)  TempSrc: Oral Oral Oral Oral  SpO2: 98% 100%  97%  Weight:      Height:        Intake/Output Summary (Last 24 hours) at 07/28/2020 1312 Last data filed at 07/28/2020 0850 Gross per 24 hour  Intake 95 ml  Output -  Net 95 ml     Wt Readings from Last 3 Encounters:  07/27/20 81.2 kg  07/23/19 80.3 kg  05/21/19 78 kg   Physical Exam  General: Alert and oriented x 3, NAD  Cardiovascular: S1 S2 clear, RRR.   Respiratory: CTAB, no wheezing, rales or rhonchi  Gastrointestinal: Soft, NBS  Ext: no pedal edema bilaterally  Neuro: no new deficits  Musculoskeletal: No cyanosis, clubbing  Skin: No rashes  Psych: Normal affect and demeanor, alert and oriented x3       Data Reviewed:  I have personally reviewed following labs and imaging studies  Micro Results No results found for this or any previous visit (from the past 240 hour(s)).  Radiology Reports DG Orthopantogram  Result Date: 07/22/2020 CLINICAL DATA:  Dental pain EXAM: ORTHOPANTOGRAM/PANORAMIC COMPARISON:  06/25/2018 FINDINGS: Negative for fracture. Interval extraction of right lower molars. Interval healing of periapical lucency. Multiple caries are present including the upper canine on the right, and left lower premolar and molar teeth. No periapical lucency identified. IMPRESSION: Multiple caries. Electronically Signed   By: Franchot Gallo M.D.   On: 07/22/2020 16:16   DG Chest 2 View  Result  Date: 07/13/2020 CLINICAL DATA:  Shortness of breath EXAM: CHEST - 2 VIEW COMPARISON:  07/26/2018 FINDINGS: Low lung volumes. Bibasilar airspace opacities. Mild cardiomegaly. No effusions. No acute bony abnormality. IMPRESSION: Low lung volumes with bibasilar airspace opacities concerning for pneumonia. Mild cardiomegaly. Electronically Signed   By: Rolm Baptise M.D.   On: 07/13/2020 11:33   US RENAL  Result Date: 07/17/2020 CLINICAL DATA:  Bilateral flank pain.  Substance abuse. EXAM: RENAL / URINARY TRACT ULTRASOUND COMPLETE COMPARISON:  None. FINDINGS: Right Kidney: Renal measurements: 11.2 x 4.3 x 5.9 cm = volume: 145 mL. Echogenicity within normal limits. No mass or hydronephrosis visualized. Left Kidney: Renal measurements: 12.5 x 5.9 x 5.1 cm = volume: 196.3 mL. Echogenicity within normal limits. No mass or hydronephrosis visualized. Bladder: Not visualized. Other: None. IMPRESSION: 1. The kidneys are normal in appearance. The mild right hydronephrosis seen on an abdominal ultrasound from July 13, 2020 is not visualized today. Electronically Signed   By: Dorise Bullion III M.D   On: 07/17/2020 14:53   DG Chest Port 1 View  Result Date: 07/15/2020 CLINICAL DATA:  Abnormal respiration EXAM: PORTABLE CHEST 1 VIEW COMPARISON:  07/13/2020 FINDINGS: Pulmonary insufflation has slightly improved and is symmetric. Superimposed multifocal pulmonary infiltrates, however, have progressed slightly in the interval, most in keeping with atypical infection or inflammation. No pneumothorax or pleural effusion. Cardiac size is at the upper limits of normal. Harrington rods again noted. IMPRESSION: Sided renal improvement in pulmonary insufflation. Progressive multifocal pulmonary infiltrates most in keeping with atypical infection or inflammation. Electronically Signed   By: Fidela Salisbury MD   On: 07/15/2020 06:03   DG Foot Complete Right  Result Date: 07/15/2020 CLINICAL DATA:  RIGHT foot pain EXAM: RIGHT FOOT  COMPLETE - 3+ VIEW COMPARISON:  None FINDINGS: Osseous mineralization normal. Joint spaces preserved. No acute fracture, dislocation, or bone destruction. IMPRESSION: Normal exam. Electronically Signed  By: Lavonia Dana M.D.   On: 07/15/2020 15:08   [place] MFM FETAL BPP WO NON STRESS  Result Date: 07/26/2020 ----------------------------------------------------------------------  OBSTETRICS REPORT                       (Signed Final 07/26/2020 01:37 pm) ---------------------------------------------------------------------- Patient Info  ID #:       [id number]                          D.O.B.:  June 14, 1985 (34 yrs)  Name:       Kelly [name]              Visit Date: 07/26/2020 10:33 am ---------------------------------------------------------------------- Performed By  Attending:        Sander Nephew      Ref. Address:     [place]                    MD                                                             [address]  Performed By:     Berlinda Last          [facility]                    [place]  Referred By:      Sloan Leiter                    MD ---------------------------------------------------------------------- Orders  #  Description                           Code        Ordered By  1  [place] MFM FETAL BPP WO NON               30092.33    KELLY DAVIS     STRESS ----------------------------------------------------------------------  #  Order #                     Accession #                Episode #  1  [id number]                   [id number]                 [id number] ---------------------------------------------------------------------- Indications  Heart disease in mother affecting pregnancy    O99.413  in third trimester  [redacted] weeks gestation of pregnancy                Z3A.28  Hypertension - Chronic/Pre-existing            O11.572  Drug use complicating pregnancy, third         O88.323  trimester  Advanced maternal age multigravida  38+,  M35.361  third trimester  Previous cesarean delivery, antepartum x1      O34.219 ---------------------------------------------------------------------- Fetal Evaluation  Num Of Fetuses:         1  Fetal Heart Rate(bpm):  150  Cardiac Activity:       Observed  Presentation:           Cephalic  Placenta:               Anterior  Amniotic Fluid  AFI FV:      Within normal limits  AFI Sum(cm)     %Tile       Largest Pocket(cm)  9.77            8           3.31  RUQ(cm)       RLQ(cm)       LUQ(cm)        LLQ(cm)  2.86          1.56          2.04           3.31 ---------------------------------------------------------------------- Biophysical Evaluation  Amniotic F.V:   Within normal limits       F. Tone:        Observed  F. Movement:    Observed                   Score:          8/8  F. Breathing:   Observed ---------------------------------------------------------------------- OB History  Gravidity:    7         Term:   3        Prem:   0        SAB:   3  TOP:          0       Ectopic:  0        Living: 3 ---------------------------------------------------------------------- Gestational Age  Best:          28w 0d     Det. By:  U/S  (07/13/20)          EDD:   10/18/20 ---------------------------------------------------------------------- Anatomy  Abdomen:               Appears normal         Bladder:                Appears normal ---------------------------------------------------------------------- Impression  Antenatal testing performed, inpatient for mangement of CHF  for tricuspid endocarditis with septic emboli  The biophysical profile was 8/8 with good fetal movement and  amniotic fluid volume. ---------------------------------------------------------------------- Recommendations  Consider NST/AFI instead of BPP unless NST is nonreactive,  given fetal gestational age.  Continue weekly testing.  Serial growth every 4 weeks.  Management per inpatient providers  ----------------------------------------------------------------------               Sander Nephew, MD Electronically Signed Final Report   07/26/2020 01:37 pm ----------------------------------------------------------------------  VAS [place] UPPER EXTREMITY VENOUS DUPLEX  Result Date: 07/27/2020 UPPER VENOUS STUDY  Indications: Pain when arm is lifted above head with PICC line Other Indications: IVDU. Risk Factors: None identified. Anticoagulation: Lovenox. Limitations: PICC line upper arm. Comparison Study: Prev 9/19 Negative LLE Performing Technologist: Vonzell Schlatter RVT  Examination Guidelines: A complete evaluation includes B-mode imaging, spectral Doppler, color Doppler, and power Doppler as needed of all accessible portions of each vessel. Bilateral testing is considered an integral part of a complete examination. Limited examinations  for reoccurring indications may be performed as noted.  Right Findings: +----------+------------+---------+-----------+----------+-------+ RIGHT     CompressiblePhasicitySpontaneousPropertiesSummary +----------+------------+---------+-----------+----------+-------+ IJV           Full       Yes       Yes                      +----------+------------+---------+-----------+----------+-------+ Subclavian    Full       Yes       Yes                      +----------+------------+---------+-----------+----------+-------+ Axillary      Full       Yes       Yes                      +----------+------------+---------+-----------+----------+-------+ Brachial      Full       Yes       Yes                      +----------+------------+---------+-----------+----------+-------+ Radial        Full                                          +----------+------------+---------+-----------+----------+-------+ Ulnar         Full                                          +----------+------------+---------+-----------+----------+-------+ Cephalic       Full                                          +----------+------------+---------+-----------+----------+-------+ Basilic       Full                                          +----------+------------+---------+-----------+----------+-------+  Summary:  Right: No evidence of deep vein thrombosis in the upper extremity. No evidence of superficial vein thrombosis in the upper extremity. However, unable to visualize the Brachial mid-distal due to PICC.  *See table(s) above for measurements and observations.    Preliminary    ECHOCARDIOGRAM LIMITED  Result Date: 07/13/2020    ECHOCARDIOGRAM REPORT   Patient Name:   GENNELL HOW Date of Exam: 07/13/2020 Medical Rec #:  [id number]          Height:       67.5 in Accession #:    [id number]         Weight:       177.0 lb Date of Birth:  1986/03/19           BSA:          1.931 m Patient Age:    34 years           BP:           128/72 mmHg Patient Gender: F                  HR:  125 bpm. Exam Location:  Inpatient Procedure: Limited Color Doppler and Cardiac Doppler STAT ECHO Indications:     endocarditis  History:         Patient has prior history of Echocardiogram examinations, most                  recent 05/21/2019. Signs/Symptoms:Bacteremia, Dyspnea and Chest                  Pain; Risk Factors:IV drug use.  Sonographer:     Johny Chess Referring Phys:  Warren Diagnosing Phys: Loralie Champagne MD IMPRESSIONS  1. Left ventricular ejection fraction, by estimation, is 55%. The left ventricle has normal function. The left ventricle has no regional wall motion abnormalities. Left ventricular diastolic parameters are indeterminate.  2. Right ventricular systolic function is moderately reduced. The right ventricular size is severely enlarged. There is severely elevated pulmonary artery systolic pressure. The estimated right ventricular systolic pressure is 66.4 mmHg.  3. Right atrial size was severely dilated.  4. A small pericardial effusion  is present.  5. The mitral valve is normal in structure. No evidence of mitral valve regurgitation. No evidence of mitral stenosis.  6. The tricuspid valve is abnormal. Tricuspid valve regurgitation is severe.  7. The aortic valve is tricuspid. Aortic valve regurgitation is not visualized. No aortic stenosis is present.  8. The inferior vena cava is dilated in size with <50% respiratory variability, suggesting right atrial pressure of 15 mmHg.  9. No definite endocarditis was visualized. However, the right heart was dilated and dysfunctional with severe pulmonary hypertension and severe TR. FINDINGS  Left Ventricle: Left ventricular ejection fraction, by estimation, is 55%. The left ventricle has normal function. The left ventricle has no regional wall motion abnormalities. The left ventricular internal cavity size was normal in size. There is no left ventricular hypertrophy. Left ventricular diastolic parameters are indeterminate. Right Ventricle: The right ventricular size is severely enlarged. No increase in right ventricular wall thickness. Right ventricular systolic function is moderately reduced. There is severely elevated pulmonary artery systolic pressure. The tricuspid regurgitant velocity is 4.40 m/s, and with an assumed right atrial pressure of 15 mmHg, the estimated right ventricular systolic pressure is 40.3 mmHg. Left Atrium: Left atrial size was normal in size. Right Atrium: Right atrial size was severely dilated. Pericardium: A small pericardial effusion is present. Mitral Valve: The mitral valve is normal in structure. No evidence of mitral valve regurgitation. No evidence of mitral valve stenosis. Tricuspid Valve: The tricuspid valve is abnormal. Tricuspid valve regurgitation is severe. Aortic Valve: The aortic valve is tricuspid. Aortic valve regurgitation is not visualized. No aortic stenosis is present. Pulmonic Valve: The pulmonic valve was normal in structure. Pulmonic valve regurgitation is  not visualized. Aorta: The aortic root is normal in size and structure. Venous: The inferior vena cava is dilated in size with less than 50% respiratory variability, suggesting right atrial pressure of 15 mmHg. IAS/Shunts: No atrial level shunt detected by color flow Doppler.  IVC IVC diam: 2.20 cm RIGHT ATRIUM           Index RA Area:     32.10 cm RA Volume:   136.00 ml 70.45 ml/m  TRICUSPID VALVE TR Peak grad:   77.4 mmHg TR Vmax:        440.00 cm/s Loralie Champagne MD Electronically signed by Loralie Champagne MD Signature Date/Time: 07/13/2020/5:19:31 PM    Final (Updated)    [place] EKG SITE RITE  Result  Date: 07/23/2020 If Site Rite image not attached, placement could not be confirmed due to current cardiac rhythm.  [place] MFM OB COMP + 14 WK  Result Date: 07/13/2020 ----------------------------------------------------------------------  OBSTETRICS REPORT                       (Signed Final 07/13/2020 02:48 pm) ---------------------------------------------------------------------- Patient Info  ID #:       [id number]                          D.O.B.:  03/09/1986 (34 yrs)  Name:       Kelly [name]              Visit Date: 07/13/2020 02:08 pm ---------------------------------------------------------------------- Performed By  Attending:        Sander Nephew      Ref. Address:     [facility]                    MD                                                             GYN  Performed By:     Valda Favia          [facility]                    [place]  Referred By:      Annalee Genta DO ---------------------------------------------------------------------- Orders  #  Description                           Code        Ordered By  1  [place] MFM OB COMP + 14 WK                15176.16    Janyth Pupa ----------------------------------------------------------------------  #  Order #                     Accession #                 Episode #  1  [id number]                   [id number]                 [id number] ---------------------------------------------------------------------- Indications  [redacted] weeks gestation of pregnancy                Z3A.26  Encounter for uncertain dates                  X93.71  Drug use complicating pregnancy, third         O92.323  trimester  Advanced maternal age multigravida 72+,        O42.523  third trimester  Previous cesarean delivery, antepartum x1      O34.219  Abdominal pain in pregnancy                    O99.89 ---------------------------------------------------------------------- Fetal Evaluation  Num Of Fetuses:         1  Fetal Heart Rate(bpm):  169  Cardiac Activity:       Observed  Presentation:           Breech  Placenta:               Anterior  P. Cord Insertion:      Not well visualized  Amniotic Fluid  AFI FV:      Within normal limits                              Largest Pocket(cm)                              5.6 ---------------------------------------------------------------------- Biometry  BPD:      64.8  mm     G. Age:  26w 1d         41  %    CI:        75.49   %    70 - 86                                                          FL/HC:      18.8   %    18.6 - 20.4  HC:      236.5  mm     G. Age:  25w 5d         14  %    HC/AC:      1.01        1.04 - 1.22  AC:      234.2  mm     G. Age:  27w 5d         86  %    FL/BPD:     68.7   %    71 - 87  FL:       44.5  mm     G. Age:  24w 5d          5  %    FL/AC:      19.0   %    20 - 24  HUM:      42.2  mm     G. Age:  25w 3d         27  %  Est. FW:     930  gm      2 lb 1 oz     49  % ---------------------------------------------------------------------- OB History  Gravidity:    7         Term:   3        Prem:   0        SAB:   3  TOP:          0       Ectopic:  0        Living: 3 ---------------------------------------------------------------------- Gestational Age  U/S Today:     26w  1d                                        EDD:   10/18/20   Best:          26w 1d     Det. By:  U/S (07/13/20)           EDD:   10/18/20 ---------------------------------------------------------------------- Anatomy  Cranium:               Appears normal         LVOT:                   Not well visualized  Cavum:                 Appears normal         Aortic Arch:            Not well visualized  Ventricles:            Not well visualized    Ductal Arch:            Not well visualized  Choroid Plexus:        Appears normal         Diaphragm:              Appears normal  Cerebellum:            Not well visualized    Stomach:                Appears normal, left                                                                        sided  Posterior Fossa:       Not well visualized    Abdomen:                Appears normal  Nuchal Fold:           Not applicable (>63    Abdominal Wall:         Not well visualized                         wks GA)  Face:                  Not well visualized    Cord Vessels:           Appears normal (3                                                                        vessel cord)  Lips:                  Not well visualized    Kidneys:  Not well visualized  Palate:                Not well visualized    Bladder:                Appears normal  Thoracic:              Appears normal         Spine:                  Not well visualized  Heart:                 Not well visualized    Upper Extremities:      Visualized  RVOT:                  Not well visualized    Lower Extremities:      Visualized  Other:  Fetus appears to be a female. Technically difficult due to maternal          habitus and fetal position. ---------------------------------------------------------------------- Cervix Uterus Adnexa  Cervix  Not visualized (advanced GA >24wks)  Uterus  No abnormality visualized.  Right Ovary  No adnexal mass visualized.  Left Ovary  No adnexal mass visualized.  Cul De Sac  No free fluid seen.  Adnexa  No abnormality visualized.  ---------------------------------------------------------------------- Impression  Single intrauterine pregnancy here for a detailed anatomy  due new onset abdominal pain and known substance abuse (  Heroine).  Normal anatomy with measurements consistent with today's  examination, Ms. Wolaver has an uncertain LMP.  There is good fetal movement and amniotic fluid volume  Suboptimal views of the fetal anatomy were obtained  secondary to fetal position and maternal ability to lay flat. ---------------------------------------------------------------------- Recommendations  Repeat growth and anatomy in 3 weeks. ----------------------------------------------------------------------               Sander Nephew, MD Electronically Signed Final Report   07/13/2020 02:48 pm ----------------------------------------------------------------------  [place] MFM OB LIMITED  Result Date: 07/20/2020 ----------------------------------------------------------------------  OBSTETRICS REPORT                       (Signed Final 07/20/2020 05:40 pm) ---------------------------------------------------------------------- Patient Info  ID #:       [id number]                          D.O.B.:  [birth year] (34 yrs)  Name:       Kelly [name]              Visit Date: 07/20/2020 11:46 am ---------------------------------------------------------------------- Performed By  Attending:        Johnell Comings MD         Ref. Address:     [address]  [place], [place]  Performed By:     Berlinda Last          [facility]  Referred By:      Osborne Oman MD ---------------------------------------------------------------------- Orders  #  Description                            Code        Ordered By  1  [place] MFM OB LIMITED                     93716.96    Verita Schneiders ----------------------------------------------------------------------  #  Order #                     Accession #                Episode #  1  [id number]                   [id number]                 [id number] ---------------------------------------------------------------------- Indications  Drug use complicating pregnancy, second        O99.322  trimester  [redacted] weeks gestation of pregnancy                Z3A.27  Advanced maternal age multigravida 65+,        O20.522  second trimester  Previous cesarean delivery, antepartum x1      O34.219  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 Cord Insertion:      Marginal insertion  Amniotic Fluid  AFI FV:      Within normal limits                              Largest Pocket(cm)                              5.2 ---------------------------------------------------------------------- OB History  Gravidity:    7  Term:   3        Prem:   0        SAB:   3  TOP:          0       Ectopic:  0        Living: 3 ---------------------------------------------------------------------- Gestational Age  Best:          27w 1d     Det. By:  U/S  (07/13/20)          EDD:   10/18/20 ---------------------------------------------------------------------- Anatomy  Stomach:               Appears normal, left   Bladder:                Appears normal                         sided ---------------------------------------------------------------------- Cervix Uterus Adnexa  Cervix  Not visualized (advanced GA >24wks)  Adnexa  No abnormality visualized.  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  She is currently  treated with vancomycin and the plan is for her to complete a  6-week course of vancomycin as an inpatient.  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  She  is being followed by the heart failure team.  A limited ultrasound performed today shows that the fetus is  in the breech presentation.  There was normal amniotic fluid noted.  During the performance of her ultrasound today, a possible  dip in the fetal heart rate down to the low 100s was noted on  the M-mode Dopplers.  The actual heart rate was difficult to  decipher based on the M mode Doppler exam.  The patient cooperated with fetal heart rate monitoring this  afternoon.  The baseline fetal heart rate was noted to be in  the 140s, with moderate variability and small accelerations.  The heart rate tracing was reassuring for her gestational age.  Due to the reported risk of fetal mortality (10% to 15%) in  pregnant women with infectious endocarditis, the plan will be  to continue daily fetal heart rate monitoring for 20 minutes  daily while she is hospitalized (if patient is cooperative).  We  will start weekly biophysical profiles and amniotic fluid checks  at 28 weeks. ----------------------------------------------------------------------                   Johnell Comings, MD Electronically Signed Final Report   07/20/2020 05:40 pm ----------------------------------------------------------------------  US Abdomen Limited RUQ (LIVER/GB)  Result Date: 07/13/2020 CLINICAL DATA:  Abdomen pain pregnant patient EXAM: ULTRASOUND ABDOMEN LIMITED RIGHT UPPER QUADRANT COMPARISON:  CT 01/15/2019 FINDINGS: Gallbladder: Surgically absent  Common bile duct: Diameter: 3.4 mm Liver: No focal lesion identified. Within normal limits in parenchymal echogenicity. Portal vein is patent on color Doppler imaging with normal direction of blood flow towards the liver. Other: Mild right hydronephrosis. IMPRESSION: 1. Status post cholecystectomy. 2. Mild right hydronephrosis Electronically Signed   By: Donavan Foil M.D.   On: 07/13/2020 22:14    Lab Data:  CBC: Recent Labs  Lab 07/22/20 0505 07/23/20 0200 07/24/20 0348 07/26/20 0557 07/27/20 0356  WBC 16.6* 15.8* 12.4* 11.3* 10.8*  HGB  10.7* 9.8* 9.6* 9.6* 9.3*  HCT 33.0* 30.3* 28.6* 28.9* 27.9*  MCV 90.9 90.7 89.7 90.0 89.7  PLT 466* 437* 442* 464* 016*   Basic Metabolic Panel: Recent Labs  Lab 07/22/20 0939 07/23/20 0200 07/24/20 0348 07/25/20 0939 07/26/20 0557 07/27/20 0356 07/28/20 0626  NA  --  129* 130* 133* 132* 134*  --   K  --  4.2 3.7 4.4 4.3 4.2  --   CL  --  94* 97* 95* 96* 98  --   CO2  --  26 24 29 29 28   --   GLUCOSE  --  76 83 93 88 105* 86  BUN  --  17 13 11 12 16   --   CREATININE  --  0.52 0.48 0.50 0.51 0.46  --   CALCIUM  --  9.4 9.1 9.2 9.4 9.4  --   MG 1.9  --   --   --  1.8  --   --    GFR: Estimated Creatinine Clearance: 108.8 mL/min (by C-G formula based on SCr of 0.46 mg/dL). Liver Function Tests: No results for input(s): AST, ALT, ALKPHOS, BILITOT, PROT, ALBUMIN in the last 168 hours. No results for input(s): LIPASE, AMYLASE in the last 168 hours. No results for input(s): AMMONIA in the last 168 hours. Coagulation Profile: No results for input(s): INR, PROTIME in the last 168 hours. Cardiac Enzymes: No results for input(s): CKTOTAL, CKMB, CKMBINDEX, TROPONINI in the last 168 hours. BNP (last 3 results) No results for input(s): PROBNP in the last 8760 hours. HbA1C: No results for input(s): HGBA1C in the last 72 hours. CBG: Recent Labs  Lab 07/25/20 0724  GLUCAP 93   Lipid Profile: No results for input(s): CHOL, HDL, LDLCALC,  TRIG, CHOLHDL, LDLDIRECT in the last 72 hours. Thyroid Function Tests: No results for input(s): TSH, T4TOTAL, FREET4, T3FREE, THYROIDAB in the last 72 hours. Anemia Panel: No results for input(s): VITAMINB12, FOLATE, FERRITIN, TIBC, IRON, RETICCTPCT in the last 72 hours. Urine analysis:    Component Value Date/Time   COLORURINE AMBER (A) 07/13/2020 1320   APPEARANCEUR CLOUDY (A) 07/13/2020 1320   APPEARANCEUR Hazy 07/10/2013 1121   LABSPEC 1.028 07/13/2020 1320   LABSPEC 1.020 07/10/2013 1121   PHURINE 5.0 07/13/2020 1320   GLUCOSEU NEGATIVE 07/13/2020 1320   GLUCOSEU Negative 07/10/2013 1121   HGBUR SMALL (A) 07/13/2020 1320   BILIRUBINUR NEGATIVE 07/13/2020 1320   BILIRUBINUR Negative 07/10/2013 1121   KETONESUR 5 (A) 07/13/2020 1320   PROTEINUR 100 (A) 07/13/2020 1320   UROBILINOGEN 1.0 02/15/2015 0530   NITRITE POSITIVE (A) 07/13/2020 1320   LEUKOCYTESUR LARGE (A) 07/13/2020 1320   LEUKOCYTESUR Trace 07/10/2013 1121     Ripudeep Rai M.D. [facility] 07/28/2020, 1:12 PM  Available via Epic secure chat 7am-7pm After 7 pm, please refer to night coverage provider listed on amion.

## 2020-07-28 NOTE — Progress Notes (Signed)
NST initiated.  Patient with no c/o leaking, bleeding, abdominal pain.  In good spirits.

## 2020-07-29 DIAGNOSIS — B9562 Methicillin resistant Staphylococcus aureus infection as the cause of diseases classified elsewhere: Secondary | ICD-10-CM | POA: Diagnosis not present

## 2020-07-29 DIAGNOSIS — Z3A28 28 weeks gestation of pregnancy: Secondary | ICD-10-CM | POA: Diagnosis not present

## 2020-07-29 DIAGNOSIS — L03115 Cellulitis of right lower limb: Secondary | ICD-10-CM | POA: Diagnosis not present

## 2020-07-29 DIAGNOSIS — I5023 Acute on chronic systolic (congestive) heart failure: Secondary | ICD-10-CM | POA: Diagnosis not present

## 2020-07-29 DIAGNOSIS — R7881 Bacteremia: Secondary | ICD-10-CM | POA: Diagnosis not present

## 2020-07-29 DIAGNOSIS — R1010 Upper abdominal pain, unspecified: Secondary | ICD-10-CM | POA: Diagnosis not present

## 2020-07-29 DIAGNOSIS — O99413 Diseases of the circulatory system complicating pregnancy, third trimester: Secondary | ICD-10-CM | POA: Diagnosis not present

## 2020-07-29 DIAGNOSIS — O99891 Other specified diseases and conditions complicating pregnancy: Secondary | ICD-10-CM | POA: Diagnosis not present

## 2020-07-29 LAB — CBC
HCT: 27.8 % — ABNORMAL LOW (ref 36.0–46.0)
Hemoglobin: 9.1 g/dL — ABNORMAL LOW (ref 12.0–15.0)
MCH: 29.3 pg (ref 26.0–34.0)
MCHC: 32.7 g/dL (ref 30.0–36.0)
MCV: 89.4 fL (ref 80.0–100.0)
Platelets: 457 10*3/uL — ABNORMAL HIGH (ref 150–400)
RBC: 3.11 MIL/uL — ABNORMAL LOW (ref 3.87–5.11)
RDW: 13.6 % (ref 11.5–15.5)
WBC: 12.1 10*3/uL — ABNORMAL HIGH (ref 4.0–10.5)
nRBC: 0 % (ref 0.0–0.2)

## 2020-07-29 LAB — BASIC METABOLIC PANEL
Anion gap: 8 (ref 5–15)
BUN: 17 mg/dL (ref 6–20)
CO2: 27 mmol/L (ref 22–32)
Calcium: 9.2 mg/dL (ref 8.9–10.3)
Chloride: 95 mmol/L — ABNORMAL LOW (ref 98–111)
Creatinine, Ser: 0.52 mg/dL (ref 0.44–1.00)
GFR, Estimated: >60 mL/min (ref >60)
Glucose, Bld: 85 mg/dL (ref 70–99)
Potassium: 4 mmol/L (ref 3.5–5.1)
Sodium: 130 mmol/L — ABNORMAL LOW (ref 135–145)

## 2020-07-29 LAB — VANCOMYCIN, PEAK: Vancomycin Pk: 28 ug/mL — ABNORMAL LOW (ref 30–40)

## 2020-07-29 NOTE — Progress Notes (Signed)
[facility] Consult Progress Note                                                                               Patient Demographics  Kelly [name], is a 35 y.o. female, DOB - 09/14/1985, JQZ:[id number]  Admit date - 07/13/2020   Admitting Physician Kipp Brood, MD  Outpatient Primary MD for the patient is [facility], [facility]  Outpatient specialists:   LOS - 16  days   Medical records reviewed and are as summarized below:    Chief Complaint  Patient presents with  . 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 -Current active IV heroin user, uses about 3 g daily and IV fentanyl, presented to the ED with 3 to 4 weeks history of bilateral lower extremity edema associated with worsening dyspnea on exertion for 4 days, intermittent fever for several weeks and pain with deep inspiration across her right lateral chest and flank. -In the ED she was noted to be fluid overload, blood cultures were positive for MRSA and she was a started on IV vancomycin. -2D echo without obvious vegetation, TEE was deferred and plan is to treat empirically for endocarditis -Patient was also found to be 26 weeks Pregnancy. Currently 28 weeks pregnancy.    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, severe pulmonary hypertension -2D echo showed decreased RV  systolic function, elevated PA pressure 92, severe TR -Was followed by cardiology/CHF team, transition to Lasix oral on 3/12  -Negative balance of 8.7 L, continue Lasix 40 mg PO daily  Anemia of chronic disease -Baseline hemoglobin 8.5-9.5 - B12 445, folate 7.4, iron 32, ferritin 111 -Continue prenatal vitamins, H&H stable, 9.1  History of IV heroin and fentanyl abuse, chronic pain syndrome -Patient now started on methadone 100 mg daily on 3/15, she has previously tolerated 100 mg of methadone daily -Dilaudid decreased to 0.5- 1 mg every 4 hours as needed for severe/breakthrough pain -HCV antibody+, hep A, hep B negative -Overnight 3/18 heart rate increased to 120s, patient reported that she had vomited her methadone during the day and was having withdrawals at night, received extra dose of methadone at 2200 -Currently no complaints, feels back to her baseline, no acute nausea vomiting  Pneumonia, possibly septic pulmonary infarcts -Continue IV vancomycin  Syphilis -Positive RPR with rash per ID, could be secondary syphilis -Penicillin 2.4MU IM given  Hyponatremia -Stable  Back pain -Unable to tolerate MRI, was discontinued, currently on IV antibiotics  Jaw pain, tooth pain, dental caries -Placed on Augmentin, multiple caries, outpatient follow-up with [facility]   Code Status: Full CODE STATUS DVT Prophylaxis:  enoxaparin (LOVENOX) injection 40 mg Start: 07/25/20 1700 Place TED hose Start: 07/13/20 1900 SCDs Start: 07/13/20 1653   Level of Care: Level of care: Progressive Family Communication: Discussed all imaging results, lab results, explained to the patient   Disposition Plan:     Status is: Inpatient   Time Spent in minutes 25 minutes  Antimicrobials:   Anti-infectives (From admission, onward)   Start     Dose/Rate Route Frequency Ordered Stop   07/22/20 1045  amoxicillin-clavulanate (AUGMENTIN) 875-125 MG per tablet 1 tablet        1 tablet  Oral Every 12 hours 07/22/20 0952 07/31/20 2359   07/18/20 0830  vancomycin (VANCOCIN) IVPB 750 mg/150 ml premix        750 mg 150 mL/hr over 60 Minutes Intravenous Every 8 hours 07/18/20 0750     07/16/20 0400  Vancomycin (VANCOCIN) 1,250 mg in sodium chloride 0.9 % 250 mL IVPB  Status:  Discontinued        1,250 mg 166.7 mL/hr over 90 Minutes Intravenous Every 8 hours 07/15/20 1944 07/18/20 0749   07/15/20 1715  penicillin g benzathine (BICILLIN LA) 1200000 UNIT/2ML injection 2.4 Million Units        2.4 Million Units Intramuscular  Once 07/15/20 1621 07/15/20 1747   07/14/20 0300  vancomycin (VANCOCIN) IVPB 1000 mg/200 mL premix        1,000 mg 200 mL/hr over 60 Minutes Intravenous Every 8 hours 07/13/20 1806 07/15/20 2059   07/13/20 1730  Vancomycin (VANCOCIN) 1,500 mg in sodium chloride 0.9 % 500 mL IVPB        1,500 mg 250 mL/hr over 120 Minutes Intravenous  Once 07/13/20 1709 07/13/20 2116   07/13/20 1730  ceFEPIme (MAXIPIME) 2 g in sodium chloride 0.9 % 100 mL IVPB  Status:  Discontinued        2 g 200 mL/hr over 30 Minutes Intravenous Every 8 hours 07/13/20 1709 07/14/20 1050   07/13/20 1315  cefTRIAXone (ROCEPHIN) 1 g in sodium chloride 0.9 % 100 mL IVPB        1 g 200 mL/hr over 30 Minutes Intravenous  Once 07/13/20 1306 07/13/20 1538   07/13/20 1315  azithromycin (ZITHROMAX) 500 mg in sodium chloride 0.9 % 250 mL IVPB        500 mg 250 mL/hr over 60 Minutes Intravenous  Once 07/13/20 1306 07/13/20 1644         Medications  Scheduled Meds: . sodium chloride   Intravenous Once  . acetaminophen  650 mg Oral Q6H  . amoxicillin-clavulanate  1 tablet Oral Q12H  . Chlorhexidine Gluconate Cloth  6 each Topical Daily  . enoxaparin (LOVENOX) injection  40 mg Subcutaneous Q24H  . feeding supplement  237 mL Oral BID BM  . furosemide  40 mg Oral Daily  . methadone  100 mg Oral Daily  . polyethylene glycol  17 g Oral BID  . prenatal vitamin w/FE, FA  1 tablet Oral Q1200  .  senna-docusate  1 tablet Oral BID  . sodium chloride flush  10-40 mL Intracatheter Q12H   Continuous Infusions: . vancomycin 750 mg (07/29/20 0846)   PRN Meds:.HYDROmorphone (DILAUDID) injection, magic mouthwash w/lidocaine, prochlorperazine, sodium chloride flush, sodium chloride flush      Subjective:   Chiyo Hunsucker was seen and examined today.  Overnight issues noted with withdrawals, patient reported that she vomited methadone during the day and was experiencing  withdrawals at night.  Received extra dose of methadone.  Currently feels back to baseline, resting comfortably.  No chest pain, shortness of breath, dizziness no nausea or vomiting.    Objective:   Vitals:   07/28/20 2338 07/29/20 0500 07/29/20 0847 07/29/20 0852  BP: 116/89 113/89  109/74  Pulse: (!) 102 87 92 87  Resp: 19 (!) 24  18  Temp: (!) 97.5 F (36.4 C) 97.7 F (36.5 C)    TempSrc:  Oral    SpO2:  99% 98%   Weight:      Height:        Intake/Output Summary (Last 24 hours) at 07/29/2020 1353 Last data filed at 07/29/2020 1112 Gross per 24 hour  Intake 480 ml  Output --  Net 480 ml     Wt Readings from Last 3 Encounters:  07/27/20 81.2 kg  07/23/19 80.3 kg  05/21/19 78 kg   Physical Exam  General: Alert and oriented x 3, NAD  Cardiovascular: S1 S2 clear, RRR. No pedal edema b/l  Respiratory: Clear to auscultation bilaterally  Gastrointestinal: Soft, NBS  Ext: no pedal edema bilaterally  Neuro: no new deficits  Musculoskeletal: No cyanosis, clubbing  Skin: No rashes  Psych: Normal affect and demeanor, alert and oriented x3     Data Reviewed:  I have personally reviewed following labs and imaging studies  Micro Results No results found for this or any previous visit (from the past 240 hour(s)).  Radiology Reports DG Orthopantogram  Result Date: 07/22/2020 CLINICAL DATA:  Dental pain EXAM: ORTHOPANTOGRAM/PANORAMIC COMPARISON:  06/25/2018 FINDINGS: Negative for fracture.  Interval extraction of right lower molars. Interval healing of periapical lucency. Multiple caries are present including the upper canine on the right, and left lower premolar and molar teeth. No periapical lucency identified. IMPRESSION: Multiple caries. Electronically Signed   By: Franchot Gallo M.D.   On: 07/22/2020 16:16   DG Chest 2 View  Result Date: 07/13/2020 CLINICAL DATA:  Shortness of breath EXAM: CHEST - 2 VIEW COMPARISON:  07/26/2018 FINDINGS: Low lung volumes. Bibasilar airspace opacities. Mild cardiomegaly. No effusions. No acute bony abnormality. IMPRESSION: Low lung volumes with bibasilar airspace opacities concerning for pneumonia. Mild cardiomegaly. Electronically Signed   By: Rolm Baptise M.D.   On: 07/13/2020 11:33   US RENAL  Result Date: 07/17/2020 CLINICAL DATA:  Bilateral flank pain.  Substance abuse. EXAM: RENAL / URINARY TRACT ULTRASOUND COMPLETE COMPARISON:  None. FINDINGS: Right Kidney: Renal measurements: 11.2 x 4.3 x 5.9 cm = volume: 145 mL. Echogenicity within normal limits. No mass or hydronephrosis visualized. Left Kidney: Renal measurements: 12.5 x 5.9 x 5.1 cm = volume: 196.3 mL. Echogenicity within normal limits. No mass or hydronephrosis visualized. Bladder: Not visualized. Other: None. IMPRESSION: 1. The kidneys are normal in appearance. The mild right hydronephrosis seen on an abdominal ultrasound from July 13, 2020 is not visualized today. Electronically Signed   By: Dorise Bullion III M.D   On: 07/17/2020 14:53   DG Chest Port 1 View  Result Date: 07/15/2020 CLINICAL DATA:  Abnormal respiration EXAM: PORTABLE CHEST 1 VIEW COMPARISON:  07/13/2020 FINDINGS: Pulmonary insufflation has slightly improved and is symmetric. Superimposed multifocal pulmonary infiltrates, however, have progressed slightly in the interval, most in keeping with atypical infection or inflammation. No pneumothorax or pleural effusion. Cardiac size is at the upper limits of normal. Harrington  rods again noted. IMPRESSION: Sided renal improvement in pulmonary insufflation. Progressive multifocal pulmonary infiltrates most in keeping with atypical infection or  inflammation. Electronically Signed   By: Fidela Salisbury MD   On: 07/15/2020 06:03   DG Foot Complete Right  Result Date: 07/15/2020 CLINICAL DATA:  RIGHT foot pain EXAM: RIGHT FOOT COMPLETE - 3+ VIEW COMPARISON:  None FINDINGS: Osseous mineralization normal. Joint spaces preserved. No acute fracture, dislocation, or bone destruction. IMPRESSION: Normal exam. Electronically Signed   By: Lavonia Dana M.D.   On: 07/15/2020 15:08   [place] MFM FETAL BPP WO NON STRESS  Result Date: 07/26/2020 ----------------------------------------------------------------------  OBSTETRICS REPORT                       (Signed Final 07/26/2020 01:37 pm) ---------------------------------------------------------------------- Patient Info  ID #:       [id number]                          D.O.B.:  05-18-1985 (34 yrs)  Name:       Kelly [name]              Visit Date: 07/26/2020 10:33 am ---------------------------------------------------------------------- Performed By  Attending:        Sander Nephew      Ref. Address:     [place]                    MD                                                             R[address]  Performed By:     Berlinda Last          [facility]                    [place]  Referred By:      Sloan Leiter                    MD ---------------------------------------------------------------------- Orders  #  Description                           Code        Ordered By  1  [place] MFM FETAL BPP WO NON               29937.16    KELLY DAVIS     STRESS ----------------------------------------------------------------------  #  Order #                     Accession #                Episode #  1  [id number]                   [id number]                 [id number]  ---------------------------------------------------------------------- Indications  Heart disease in mother affecting pregnancy    O99.413  in third trimester  [redacted] weeks gestation of  pregnancy                Z3A.28  Hypertension - Chronic/Pre-existing            P10.258  Drug use complicating pregnancy, third         O80.323  trimester  Advanced maternal age multigravida 50+,        O79.523  third trimester  Previous cesarean delivery, antepartum x1      O34.219 ---------------------------------------------------------------------- Fetal Evaluation  Num Of Fetuses:         1  Fetal Heart Rate(bpm):  150  Cardiac Activity:       Observed  Presentation:           Cephalic  Placenta:               Anterior  Amniotic Fluid  AFI FV:      Within normal limits  AFI Sum(cm)     %Tile       Largest Pocket(cm)  9.77            8           3.31  RUQ(cm)       RLQ(cm)       LUQ(cm)        LLQ(cm)  2.86          1.56          2.04           3.31 ---------------------------------------------------------------------- Biophysical Evaluation  Amniotic F.V:   Within normal limits       F. Tone:        Observed  F. Movement:    Observed                   Score:          8/8  F. Breathing:   Observed ---------------------------------------------------------------------- OB History  Gravidity:    7         Term:   3        Prem:   0        SAB:   3  TOP:          0       Ectopic:  0        Living: 3 ---------------------------------------------------------------------- Gestational Age  Best:          28w 0d     Det. By:  U/S  (07/13/20)          EDD:   10/18/20 ---------------------------------------------------------------------- Anatomy  Abdomen:               Appears normal         Bladder:                Appears normal ---------------------------------------------------------------------- Impression  Antenatal testing performed, inpatient for mangement of CHF  for tricuspid endocarditis with septic emboli  The biophysical profile was  8/8 with good fetal movement and  amniotic fluid volume. ---------------------------------------------------------------------- Recommendations  Consider NST/AFI instead of BPP unless NST is nonreactive,  given fetal gestational age.  Continue weekly testing.  Serial growth every 4 weeks.  Management per inpatient providers ----------------------------------------------------------------------               Sander Nephew, MD Electronically Signed Final Report   07/26/2020 01:37 pm ----------------------------------------------------------------------  VAS [place] UPPER EXTREMITY VENOUS DUPLEX  Result Date: 07/28/2020 UPPER VENOUS STUDY  Indications: Pain when arm is lifted above head with PICC line  Other Indications: IVDU. Risk Factors: None identified. Anticoagulation: Lovenox. Limitations: PICC line upper arm. Comparison Study: Prev 9/19 Negative LLE Performing Technologist: Vonzell Schlatter RVT  Examination Guidelines: A complete evaluation includes B-mode imaging, spectral Doppler, color Doppler, and power Doppler as needed of all accessible portions of each vessel. Bilateral testing is considered an integral part of a complete examination. Limited examinations for reoccurring indications may be performed as noted.  Right Findings: +----------+------------+---------+-----------+----------+-------+ RIGHT     CompressiblePhasicitySpontaneousPropertiesSummary +----------+------------+---------+-----------+----------+-------+ IJV           Full       Yes       Yes                      +----------+------------+---------+-----------+----------+-------+ Subclavian    Full       Yes       Yes                      +----------+------------+---------+-----------+----------+-------+ Axillary      Full       Yes       Yes                      +----------+------------+---------+-----------+----------+-------+ Brachial      Full       Yes       Yes                       +----------+------------+---------+-----------+----------+-------+ Radial        Full                                          +----------+------------+---------+-----------+----------+-------+ Ulnar         Full                                          +----------+------------+---------+-----------+----------+-------+ Cephalic      Full                                          +----------+------------+---------+-----------+----------+-------+ Basilic       Full                                          +----------+------------+---------+-----------+----------+-------+  Summary:  Right: No evidence of deep vein thrombosis in the upper extremity. No evidence of superficial vein thrombosis in the upper extremity. However, unable to visualize the Brachial mid-distal due to PICC.  *See table(s) above for measurements and observations.  Diagnosing physician: Servando Snare MD Electronically signed by Servando Snare MD on 07/28/2020 at 5:59:32 PM.    Final    ECHOCARDIOGRAM LIMITED  Result Date: 07/13/2020    ECHOCARDIOGRAM REPORT   Patient Name:   Kelly [name] Date of Exam: 07/13/2020 Medical Rec #:  [id number]          Height:       67.5 in Accession #:    [id number]         Weight:       177.0 lb Date of Birth:  [birth year]           BSA:          1.931 m Patient Age:    34 years           BP:           128/72 mmHg Patient Gender: F                  HR:           125 bpm. Exam Location:  Inpatient Procedure: Limited Color Doppler and Cardiac Doppler STAT ECHO Indications:     endocarditis  History:         Patient has prior history of Echocardiogram examinations, most                  recent 05/21/2019. Signs/Symptoms:Bacteremia, Dyspnea and Chest                  Pain; Risk Factors:IV drug use.  Sonographer:     Johny Chess Referring Phys:  [place] Diagnosing Phys: Loralie Champagne MD IMPRESSIONS  1. Left ventricular ejection fraction, by estimation, is 55%. The left ventricle  has normal function. The left ventricle has no regional wall motion abnormalities. Left ventricular diastolic parameters are indeterminate.  2. Right ventricular systolic function is moderately reduced. The right ventricular size is severely enlarged. There is severely elevated pulmonary artery systolic pressure. The estimated right ventricular systolic pressure is 62.6 mmHg.  3. Right atrial size was severely dilated.  4. A small pericardial effusion is present.  5. The mitral valve is normal in structure. No evidence of mitral valve regurgitation. No evidence of mitral stenosis.  6. The tricuspid valve is abnormal. Tricuspid valve regurgitation is severe.  7. The aortic valve is tricuspid. Aortic valve regurgitation is not visualized. No aortic stenosis is present.  8. The inferior vena cava is dilated in size with <50% respiratory variability, suggesting right atrial pressure of 15 mmHg.  9. No definite endocarditis was visualized. However, the right heart was dilated and dysfunctional with severe pulmonary hypertension and severe TR. FINDINGS  Left Ventricle: Left ventricular ejection fraction, by estimation, is 55%. The left ventricle has normal function. The left ventricle has no regional wall motion abnormalities. The left ventricular internal cavity size was normal in size. There is no left ventricular hypertrophy. Left ventricular diastolic parameters are indeterminate. Right Ventricle: The right ventricular size is severely enlarged. No increase in right ventricular wall thickness. Right ventricular systolic function is moderately reduced. There is severely elevated pulmonary artery systolic pressure. The tricuspid regurgitant velocity is 4.40 m/s, and with an assumed right atrial pressure of 15 mmHg, the estimated right ventricular systolic pressure is 94.8 mmHg. Left Atrium: Left atrial size was normal in size. Right Atrium: Right atrial size was severely dilated. Pericardium: A small pericardial  effusion is present. Mitral Valve: The mitral valve is normal in structure. No evidence of mitral valve regurgitation. No evidence of mitral valve stenosis. Tricuspid Valve: The tricuspid valve is abnormal. Tricuspid valve regurgitation is severe. Aortic Valve: The aortic valve is tricuspid. Aortic valve regurgitation is not visualized. No aortic stenosis is present. Pulmonic Valve: The pulmonic valve was normal in structure. Pulmonic valve regurgitation is not visualized. Aorta: The aortic root is normal in size and structure. Venous: The inferior vena cava is dilated in size with less than 50% respiratory variability, suggesting right atrial pressure of 15 mmHg. IAS/Shunts: No atrial level shunt detected by color flow  Doppler.  IVC IVC diam: 2.20 cm RIGHT ATRIUM           Index RA Area:     32.10 cm RA Volume:   136.00 ml 70.45 ml/m  TRICUSPID VALVE TR Peak grad:   77.4 mmHg TR Vmax:        440.00 cm/s Loralie Champagne MD Electronically signed by Loralie Champagne MD Signature Date/Time: 07/13/2020/5:19:31 PM    Final (Updated)    [place] EKG SITE RITE  Result Date: 07/23/2020 If Site Rite image not attached, placement could not be confirmed due to current cardiac rhythm.  [place] MFM OB COMP + 14 WK  Result Date: 07/13/2020 ----------------------------------------------------------------------  OBSTETRICS REPORT                       (Signed Final 07/13/2020 02:48 pm) ---------------------------------------------------------------------- Patient Info  ID #:       [id number]                          D.O.B.:  [birth year] (34 yrs)  Name:       Kelly [name]              Visit Date: 07/13/2020 02:08 pm ---------------------------------------------------------------------- Performed By  Attending:        Sander Nephew      Ref. Address:     [facility]                    MD                                                             GYN  Performed By:     Valda Favia          Location[facility]                     [place]  Referred By:      Annalee Genta DO ---------------------------------------------------------------------- Orders  #  Description                           Code        Ordered By  1  [place] MFM OB COMP + 14 WK                98264.15    Janyth Pupa ----------------------------------------------------------------------  #  Order #                     Accession #                Episode #  1  [id number]                   [id number]  [id number] ---------------------------------------------------------------------- Indications  [redacted] weeks gestation of pregnancy                Z3A.26  Encounter for uncertain dates                  O16.07  Drug use complicating pregnancy, third         O35.323  trimester  Advanced maternal age multigravida 38+,        O17.523  third trimester  Previous cesarean delivery, antepartum x1      O34.219  Abdominal pain in pregnancy                    O99.89 ---------------------------------------------------------------------- Fetal Evaluation  Num Of Fetuses:         1  Fetal Heart Rate(bpm):  169  Cardiac Activity:       Observed  Presentation:           Breech  Placenta:               Anterior  P. Cord Insertion:      Not well visualized  Amniotic Fluid  AFI FV:      Within normal limits                              Largest Pocket(cm)                              5.6 ---------------------------------------------------------------------- Biometry  BPD:      64.8  mm     G. Age:  26w 1d         41  %    CI:        75.49   %    70 - 86                                                          FL/HC:      18.8   %    18.6 - 20.4  HC:      236.5  mm     G. Age:  25w 5d         14  %    HC/AC:      1.01        1.04 - 1.22  AC:      234.2  mm     G. Age:  27w 5d         86  %    FL/BPD:     68.7   %    71 - 87  FL:       44.5  mm     G. Age:  24w 5d          5  %    FL/AC:      19.0   %    20 - 24  HUM:       42.2  mm     G. Age:  25w 3d         27  %  Est. FW:     930  gm      2 lb 1 oz     49  % ----------------------------------------------------------------------  OB History  Gravidity:    7         Term:   3        Prem:   0        SAB:   3  TOP:          0       Ectopic:  0        Living: 3 ---------------------------------------------------------------------- Gestational Age  U/S Today:     26w 1d                                        EDD:   10/18/20  Best:          26w 1d     Det. By:  U/S (07/13/20)           EDD:   10/18/20 ---------------------------------------------------------------------- Anatomy  Cranium:               Appears normal         LVOT:                   Not well visualized  Cavum:                 Appears normal         Aortic Arch:            Not well visualized  Ventricles:            Not well visualized    Ductal Arch:            Not well visualized  Choroid Plexus:        Appears normal         Diaphragm:              Appears normal  Cerebellum:            Not well visualized    Stomach:                Appears normal, left                                                                        sided  Posterior Fossa:       Not well visualized    Abdomen:                Appears normal  Nuchal Fold:           Not applicable (>17    Abdominal Wall:         Not well visualized                         wks GA)  Face:                  Not well visualized    Cord Vessels:           Appears normal (3  vessel cord)  Lips:                  Not well visualized    Kidneys:                Not well visualized  Palate:                Not well visualized    Bladder:                Appears normal  Thoracic:              Appears normal         Spine:                  Not well visualized  Heart:                 Not well visualized    Upper Extremities:      Visualized  RVOT:                  Not well visualized    Lower Extremities:       Visualized  Other:  Fetus appears to be a female. Technically difficult due to maternal          habitus and fetal position. ---------------------------------------------------------------------- Cervix Uterus Adnexa  Cervix  Not visualized (advanced GA >24wks)  Uterus  No abnormality visualized.  Right Ovary  No adnexal mass visualized.  Left Ovary  No adnexal mass visualized.  Cul De Sac  No free fluid seen.  Adnexa  No abnormality visualized. ---------------------------------------------------------------------- Impression  Single intrauterine pregnancy here for a detailed anatomy  due new onset abdominal pain and known substance abuse (  Heroine).  Normal anatomy with measurements consistent with today's  examination, Ms. Kwan has an uncertain LMP.  There is good fetal movement and amniotic fluid volume  Suboptimal views of the fetal anatomy were obtained  secondary to fetal position and maternal ability to lay flat. ---------------------------------------------------------------------- Recommendations  Repeat growth and anatomy in 3 weeks. ----------------------------------------------------------------------               Sander Nephew, MD Electronically Signed Final Report   07/13/2020 02:48 pm ----------------------------------------------------------------------  [place] MFM OB LIMITED  Result Date: 07/20/2020 ----------------------------------------------------------------------  OBSTETRICS REPORT                       (Signed Final 07/20/2020 05:40 pm) ---------------------------------------------------------------------- Patient Info  ID #:       [id number]                          D.O.B.:  Feb 03, 1986 (34 yrs)  Name:       Kelly [name]              Visit Date: 07/20/2020 11:46 am ---------------------------------------------------------------------- Performed By  Attending:        Johnell Comings MD         Ref. Address:     [address]  [place], [place]  Performed By:     Berlinda Last          [facility]  Referred By:      Osborne Oman MD ---------------------------------------------------------------------- Orders  #  Description                           Code        Ordered By  1  [place] MFM OB LIMITED                     16945.03    Verita Schneiders ----------------------------------------------------------------------  #  Order #                     Accession #                Episode #  1  [id number]                   [id number]                 [id number] ---------------------------------------------------------------------- Indications  Drug use complicating pregnancy, second        O99.322  trimester  [redacted] weeks gestation of pregnancy                Z3A.27  Advanced maternal age multigravida 90+,        O33.522  second trimester  Previous cesarean delivery, antepartum x1      O34.219  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 Cord Insertion:      Marginal insertion  Amniotic Fluid  AFI FV:      Within normal limits                              Largest Pocket(cm)                              5.2 ---------------------------------------------------------------------- OB History  Gravidity:    7  Term:   3        Prem:   0        SAB:   3  TOP:          0       Ectopic:  0        Living: 3 ---------------------------------------------------------------------- Gestational  Age  Best:          27w 1d     Det. By:  U/S  (07/13/20)          EDD:   10/18/20 ---------------------------------------------------------------------- Anatomy  Stomach:               Appears normal, left   Bladder:                Appears normal                         sided ---------------------------------------------------------------------- Cervix Uterus Adnexa  Cervix  Not visualized (advanced GA >24wks)  Adnexa  No abnormality visualized. 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  She is currently  treated with vancomycin and the plan is for her to complete a  6-week course of vancomycin as an inpatient.  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  She  is being followed by the heart failure team.  A limited ultrasound performed today shows that the fetus is  in the breech presentation.  There was normal amniotic fluid noted.  During the performance of her ultrasound today, a possible  dip in the fetal heart rate down to the low 100s was noted on  the M-mode Dopplers.  The actual heart rate was difficult to  decipher based on the M mode Doppler exam.  The patient cooperated with fetal heart rate monitoring this  afternoon.  The baseline fetal heart rate was noted to be in  the 140s, with moderate variability and small accelerations.  The heart rate tracing was reassuring for her gestational age.  Due to the reported risk of fetal mortality (10% to 15%) in  pregnant women with infectious endocarditis, the plan will be  to continue daily fetal heart rate monitoring for 20 minutes  daily while she is hospitalized (if patient is cooperative).  We  will start weekly biophysical profiles and amniotic fluid checks  at 28 weeks.  ----------------------------------------------------------------------                   Johnell Comings, MD Electronically Signed Final Report   07/20/2020 05:40 pm ----------------------------------------------------------------------  US Abdomen Limited RUQ (LIVER/GB)  Result Date: 07/13/2020 CLINICAL DATA:  Abdomen pain pregnant patient EXAM: ULTRASOUND ABDOMEN LIMITED RIGHT UPPER QUADRANT COMPARISON:  CT 01/15/2019 FINDINGS: Gallbladder: Surgically absent Common bile duct: Diameter: 3.4 mm Liver: No focal lesion identified. Within normal limits in parenchymal echogenicity. Portal vein is patent on color Doppler imaging with normal direction of blood flow towards the liver. Other: Mild right hydronephrosis. IMPRESSION: 1. Status post cholecystectomy. 2. Mild right hydronephrosis Electronically Signed   By: Donavan Foil M.D.   On: 07/13/2020 22:14    Lab Data:  CBC: Recent Labs  Lab 07/23/20 0200 07/24/20 0348 07/26/20 0557 07/27/20 0356 07/29/20 0500  WBC 15.8* 12.4* 11.3* 10.8* 12.1*  HGB  9.8* 9.6* 9.6* 9.3* 9.1*  HCT 30.3* 28.6* 28.9* 27.9* 27.8*  MCV 90.7 89.7 90.0 89.7 89.4  PLT 437* 442* 464* 470* 703*   Basic Metabolic Panel: Recent Labs  Lab 07/24/20 0348 07/25/20 0939 07/26/20 0557 07/27/20 0356 07/28/20 0626 07/29/20 0500  NA 130* 133* 132* 134*  --  130*  K 3.7 4.4 4.3 4.2  --  4.0  CL 97* 95* 96* 98  --  95*  CO2 24 29 29 28   --  27  GLUCOSE 83 93 88 105* 86 85  BUN 13 11 12 16   --  17  CREATININE 0.48 0.50 0.51 0.46  --  0.52  CALCIUM 9.1 9.2 9.4 9.4  --  9.2  MG  --   --  1.8  --   --   --    GFR: Estimated Creatinine Clearance: 108.8 mL/min (by C-G formula based on SCr of 0.52 mg/dL). Liver Function Tests: No results for input(s): AST, ALT, ALKPHOS, BILITOT, PROT, ALBUMIN in the last 168 hours. No results for input(s): LIPASE, AMYLASE in the last 168 hours. No results for input(s): AMMONIA in the last 168 hours. Coagulation Profile: No results for  input(s): INR, PROTIME in the last 168 hours. Cardiac Enzymes: No results for input(s): CKTOTAL, CKMB, CKMBINDEX, TROPONINI in the last 168 hours. BNP (last 3 results) No results for input(s): PROBNP in the last 8760 hours. HbA1C: No results for input(s): HGBA1C in the last 72 hours. CBG: Recent Labs  Lab 07/25/20 0724  GLUCAP 93   Lipid Profile: No results for input(s): CHOL, HDL, LDLCALC, TRIG, CHOLHDL, LDLDIRECT in the last 72 hours. Thyroid Function Tests: No results for input(s): TSH, T4TOTAL, FREET4, T3FREE, THYROIDAB in the last 72 hours. Anemia Panel: No results for input(s): VITAMINB12, FOLATE, FERRITIN, TIBC, IRON, RETICCTPCT in the last 72 hours. Urine analysis:    Component Value Date/Time   COLORURINE AMBER (A) 07/13/2020 1320   APPEARANCEUR CLOUDY (A) 07/13/2020 1320   APPEARANCEUR Hazy 07/10/2013 1121   LABSPEC 1.028 07/13/2020 1320   LABSPEC 1.020 07/10/2013 1121   PHURINE 5.0 07/13/2020 1320   GLUCOSEU NEGATIVE 07/13/2020 1320   GLUCOSEU Negative 07/10/2013 1121   HGBUR SMALL (A) 07/13/2020 1320   BILIRUBINUR NEGATIVE 07/13/2020 1320   BILIRUBINUR Negative 07/10/2013 1121   KETONESUR 5 (A) 07/13/2020 1320   PROTEINUR 100 (A) 07/13/2020 1320   UROBILINOGEN 1.0 02/15/2015 0530   NITRITE POSITIVE (A) 07/13/2020 1320   LEUKOCYTESUR LARGE (A) 07/13/2020 1320   LEUKOCYTESUR Trace 07/10/2013 1121     Albirta Rhinehart M.D. [facility] 07/29/2020, 1:53 PM  Available via Epic secure chat 7am-7pm After 7 pm, please refer to night coverage provider listed on amion.

## 2020-07-29 NOTE — Progress Notes (Signed)
FACULTY PRACTICE ANTEPARTUM PROGRESS NOTE  Kelly [name] is a 35 y.o. [phone] at 20w3dwho is admitted for MRSA bacteremia, infective endocarditis, severe tricuspid regurgitation, septic emboli with h/o discitis/osteomyelitis/epidural abscess, syphilis, chronic HTN, and acute on chronic right side heart failure in the setting of longstanding h/o IV heroin and fentanyl abuse.Estimated Date of Delivery: 10/18/20 Fetal presentation iscephalicper 39/48/54ultrasound   Length of Stay:  16 Days. Admitted 07/13/2020  Subjective: Patient reports normal fetal movement.  She denies uterine contractions, denies bleeding and leaking of fluid per vagina. She vomited up her glucola drink yesterday, along with all of her medications.   Vitals:  Blood pressure 109/74, pulse 87, temperature 97.7 F (36.5 C), temperature source Oral, resp. rate 18, height 5' 7.5" (1.715 m), weight 81.2 kg, SpO2 98 %, not currently breastfeeding. Physical Examination: CONSTITUTIONAL: Well-developed, well-nourished female in no acute distress.  HENT:  Normocephalic, atraumatic, External right and left ear normal. Oropharynx is clear and moist EYES: Conjunctivae and EOM are normal. Pupils are equal, round, and reactive to light. No scleral icterus.  NECK: Normal range of motion, supple, no masses. SKIN: Skin is warm and dry. No rash noted. Not diaphoretic. No erythema. No pallor. [place] and oriented to person, place, and time. Normal reflexes, muscle tone coordination. No cranial nerve deficit noted. PSYCHIATRIC: Normal mood and affect. Normal behavior. Normal judgment and thought content. CARDIOVASCULAR: Normal heart rate noted RESPIRATORY: Effort normal, no problems with respiration noted MUSCULOSKELETAL: Normal range of motion. No edema and no tenderness. ABDOMEN: Soft, nontender, nondistended, gravid. CERVIX: deferred  Fetal monitoring: FHR: 135 bpm, Variability: moderate, Accelerations: Present,  Decelerations: Absent  Uterine activity: no contractions per hour  Results for orders placed or performed during the hospital encounter of 07/13/20 (from the past 48 hour(s))  Glucose, fasting gestational     Status: None   Collection Time: 07/28/20  6:26 AM  Result Value Ref Range   Glucose, Fasting-Gestational 86 mg/dL    Comment: Performed at [facility] [id number] Glucose, 1 hour gestational     Status: None   Collection Time: 07/28/20  9:50 AM  Result Value Ref Range   Glucose, 1 Hour-Gestational 78 70 - 189 mg/dL    Comment: Performed at [facility] [place] Glucose, 2 hour gestational     Status: None   Collection Time: 07/28/20 10:50 AM  Result Value Ref Range   Glucose, 2 Hour-Gestational 83 70 - 164 mg/dL    Comment: Performed at [facility] [place] CBC     Status: Abnormal   Collection Time: 07/29/20  5:00 AM  Result Value Ref Range   WBC 12.1 (H) 4.0 - 10.5 K/uL   RBC 3.11 (L) 3.87 - 5.11 MIL/uL   Hemoglobin 9.1 (L) 12.0 - 15.0 g/dL   HCT 27.8 (L) 36.0 - 46.0 %   MCV 89.4 80.0 - 100.0 fL   MCH 29.3 26.0 - 34.0 pg   MCHC 32.7 30.0 - 36.0 g/dL   RDW 13.6 11.5 - 15.5 %   Platelets 457 (H) 150 - 400 K/uL   nRBC 0.0 0.0 - 0.2 %    Comment: Performed at [facility] [address], [place] [address] Basic metabolic panel     Status: Abnormal   Collection Time: 07/29/20  5:00 AM  Result Value Ref Range  Sodium 130 (L) 135 - 145 mmol/L   Potassium 4.0 3.5 - 5.1 mmol/L   Chloride 95 (L) 98 - 111 mmol/L   CO2 27 22 - 32 mmol/L   Glucose, Bld 85 70 - 99 mg/dL    Comment: Glucose reference range applies only to samples taken after fasting for at least 8 hours.   BUN 17 6 - 20 mg/dL   Creatinine, Ser 0.52 0.44 - 1.00 mg/dL   Calcium 9.2 8.9 - 10.3 mg/dL   GFR, Estimated >60 >60 mL/min    Comment: (NOTE) Calculated using the  CKD-EPI Creatinine Equation (2021)    Anion gap 8 5 - 15    Comment: Performed at [facility] [address], [place], [place] [address]    I have reviewed the patient's current medications.  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)   Tricuspid regurgitation   Preexisting hypertension complicating pregnancy, antepartum   Dental caries   Endocarditis of tricuspid valve   History of VBAC   Lumbar back pain   Acute on chronic congestive heart failure (HCC)   Cellulitis of right lower extremity   Septic embolism (HCC)   Syphilis   [redacted] weeks gestation of pregnancy   Flank pain   Drug use affecting pregnancy in second trimester   Maternal endocarditis affecting pregnancy in second trimester, antepartum   Methadone maintenance treatment affecting pregnancy ([place])   Maternal endocarditis complicating pregnancy in third trimester   Pain, dental   PLAN: - pt did not tolerate her glucola drink, she is agreeable to doing CBGs 4x daily for a week to assess DM status - cont methadone 100 mg daily, seems to be a good dose for her currently - Management per IM, ID and Cardiology teams as current hospitalization is not related to pregnancy - Appreciate the multidisciplinary care for this complex patient. - Will defer decisions about appropriate analgesia to her primaryteam.  - Continue antibiotic therapies as per ID. - From an OB standpoint, fetal well being reassuring. Reassuring FHT - Weekly BPP   -For TOLAC - We will continue to follow patient closely. Daily FHR assessment will be done by our Rapid Response OB Nurse as directed, and weekly ultrasounds by our MFM team.    Please  call [phone] [facility] Consult Attending Monday-Friday 8am - 5pm) or [phone] [facility] Attending On Call all day, every day) for any obstetric concerns at any time.   Total consultation time including face-to-face time with patient (>50% of time), reviewing chart and documentation: 25 minutes   Continue routine antenatal care.   Feliz Beam, MD, [place] [facility] [employer] (Faculty Practice)  07/29/2020 2:07 PM

## 2020-07-29 NOTE — Progress Notes (Signed)
NST completed.  Pt has no complains of Vag Bleeding, LOF, or UCs.  Positive fetal movement noted.Dr Rosana Hoes made aware.

## 2020-07-29 NOTE — Progress Notes (Signed)
Pt HR increased to 120s making MEWS score to yellow.Vitals stable except being tachycardic. Oncall MD with [facility] ( Zierle-Ghosh, [place] B, DO) ordered extra dose of methadone per pt request as she expressed that she had not quite had the dayshift methadone because she feels she vomited her methadone when she vomited on dayshift. She was complaining of drug withdrawal.

## 2020-07-30 DIAGNOSIS — R7881 Bacteremia: Secondary | ICD-10-CM | POA: Diagnosis not present

## 2020-07-30 DIAGNOSIS — B9562 Methicillin resistant Staphylococcus aureus infection as the cause of diseases classified elsewhere: Secondary | ICD-10-CM | POA: Diagnosis not present

## 2020-07-30 LAB — BASIC METABOLIC PANEL
Anion gap: 8 (ref 5–15)
BUN: 14 mg/dL (ref 6–20)
CO2: 27 mmol/L (ref 22–32)
Calcium: 9.1 mg/dL (ref 8.9–10.3)
Chloride: 97 mmol/L — ABNORMAL LOW (ref 98–111)
Creatinine, Ser: 0.51 mg/dL (ref 0.44–1.00)
GFR, Estimated: >60 mL/min (ref >60)
Glucose, Bld: 77 mg/dL (ref 70–99)
Potassium: 3.9 mmol/L (ref 3.5–5.1)
Sodium: 132 mmol/L — ABNORMAL LOW (ref 135–145)

## 2020-07-30 LAB — CBC
HCT: 27.4 % — ABNORMAL LOW (ref 36.0–46.0)
Hemoglobin: 9 g/dL — ABNORMAL LOW (ref 12.0–15.0)
MCH: 29.3 pg (ref 26.0–34.0)
MCHC: 32.8 g/dL (ref 30.0–36.0)
MCV: 89.3 fL (ref 80.0–100.0)
Platelets: 413 10*3/uL — ABNORMAL HIGH (ref 150–400)
RBC: 3.07 MIL/uL — ABNORMAL LOW (ref 3.87–5.11)
RDW: 13.7 % (ref 11.5–15.5)
WBC: 9.6 10*3/uL (ref 4.0–10.5)
nRBC: 0 % (ref 0.0–0.2)

## 2020-07-30 LAB — GLUCOSE, CAPILLARY
Glucose-Capillary: 75 mg/dL (ref 70–99)
Glucose-Capillary: 79 mg/dL (ref 70–99)

## 2020-07-30 LAB — VANCOMYCIN, TROUGH: Vancomycin Tr: 13 ug/mL — ABNORMAL LOW (ref 15–20)

## 2020-07-30 NOTE — Progress Notes (Signed)
[facility] Consult Progress Note                                                                               Patient Demographics  Kelly [name], is a 35 y.o. female, DOB - 1986/01/19, PVV:[id number]  Admit date - 07/13/2020   Admitting Physician Kipp Brood, MD  Outpatient Primary MD for the patient is [facility], [facility]  Outpatient specialists:   LOS - 17  days   Medical records reviewed and are as summarized below:    Chief Complaint  Patient presents with  . 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 -Current active IV heroin user, uses about 3 g daily and IV fentanyl, presented to the ED with 3 to 4 weeks history of bilateral lower extremity edema associated with worsening dyspnea on exertion for 4 days, intermittent fever for several weeks and pain with deep inspiration across her right lateral chest and flank. -In the ED she was noted to be fluid overload, blood cultures were positive for MRSA and she was a started on IV vancomycin. -2D echo without obvious vegetation, TEE was deferred and plan is to treat empirically for endocarditis -Patient was also found to be 26 weeks Pregnancy. Currently 28 weeks pregnancy.   07/30/2020: Patient seen.  Available records reviewed.  Leg edema has improved significantly.  No shortness of breath.  Patient has since stable.   Assessment & Plan   28 weeks pregnancy -Management per primary service, OB  MRSA bacteremia with presumed endocarditis -Per ID recommendations, continue IV vancomycin -2D echo without obvious vegetations, TEE deferred as empirically treating as endocarditis -Repeat blood cultures so far negative -On IV vancomycin  x 6 weeks, afebrile, 07/30/2020: Complete course of antibiotics.   Acute on chronic right-sided heart failure with severe TR, severe pulmonary hypertension -2D echo showed decreased RV systolic function, elevated PA pressure 92, severe TR -Was followed by cardiology/CHF team, transition to Lasix oral on 3/12  -Negative balance of 8.7 L, continue Lasix 40 mg PO daily  Anemia of chronic disease -Baseline hemoglobin 8.5-9.5 - B12 445, folate 7.4, iron 32, ferritin 111 -Continue prenatal vitamins, H&H stable, 9.1  History of IV heroin and fentanyl abuse, chronic pain syndrome -Patient now started on methadone 100 mg daily on 3/15, she has previously tolerated 100 mg of methadone daily -Dilaudid decreased to 0.5- 1 mg every 4 hours as needed for severe/breakthrough pain -HCV antibody+, hep A, hep B negative -Overnight 3/18 heart rate increased to 120s, patient reported that she had vomited her methadone during the day and was having withdrawals at night, received extra dose of methadone at 2200 -Currently no complaints, feels back to her baseline, no acute nausea vomiting  Pneumonia, possibly septic pulmonary infarcts -Continue IV vancomycin  Syphilis -Positive RPR with rash per ID, could be secondary syphilis -Penicillin 2.4MU IM given  Hyponatremia -Stable  Back pain -  Unable to tolerate MRI, was discontinued, currently on IV antibiotics  Jaw pain, tooth pain, dental caries -Placed on Augmentin, multiple caries, outpatient follow-up with [facility]   Code Status: Full CODE STATUS DVT Prophylaxis:  enoxaparin (LOVENOX) injection 40 mg Start: 07/25/20 1700 Place TED hose Start: 07/13/20 1900 SCDs Start: 07/13/20 1653   Level of Care: Level of care: Progressive Family Communication: Discussed all imaging results, lab results, explained to the patient   Disposition Plan:     Status is: Inpatient   Time Spent in minutes 25 minutes  Antimicrobials:    Anti-infectives (From admission, onward)   Start     Dose/Rate Route Frequency Ordered Stop   07/22/20 1045  amoxicillin-clavulanate (AUGMENTIN) 875-125 MG per tablet 1 tablet        1 tablet Oral Every 12 hours 07/22/20 0952 07/31/20 2359   07/18/20 0830  vancomycin (VANCOCIN) IVPB 750 mg/150 ml premix        750 mg 150 mL/hr over 60 Minutes Intravenous Every 8 hours 07/18/20 0750     07/16/20 0400  Vancomycin (VANCOCIN) 1,250 mg in sodium chloride 0.9 % 250 mL IVPB  Status:  Discontinued        1,250 mg 166.7 mL/hr over 90 Minutes Intravenous Every 8 hours 07/15/20 1944 07/18/20 0749   07/15/20 1715  penicillin g benzathine (BICILLIN LA) 1200000 UNIT/2ML injection 2.4 Million Units        2.4 Million Units Intramuscular  Once 07/15/20 1621 07/15/20 1747   07/14/20 0300  vancomycin (VANCOCIN) IVPB 1000 mg/200 mL premix        1,000 mg 200 mL/hr over 60 Minutes Intravenous Every 8 hours 07/13/20 1806 07/15/20 2059   07/13/20 1730  Vancomycin (VANCOCIN) 1,500 mg in sodium chloride 0.9 % 500 mL IVPB        1,500 mg 250 mL/hr over 120 Minutes Intravenous  Once 07/13/20 1709 07/13/20 2116   07/13/20 1730  ceFEPIme (MAXIPIME) 2 g in sodium chloride 0.9 % 100 mL IVPB  Status:  Discontinued        2 g 200 mL/hr over 30 Minutes Intravenous Every 8 hours 07/13/20 1709 07/14/20 1050   07/13/20 1315  cefTRIAXone (ROCEPHIN) 1 g in sodium chloride 0.9 % 100 mL IVPB        1 g 200 mL/hr over 30 Minutes Intravenous  Once 07/13/20 1306 07/13/20 1538   07/13/20 1315  azithromycin (ZITHROMAX) 500 mg in sodium chloride 0.9 % 250 mL IVPB        500 mg 250 mL/hr over 60 Minutes Intravenous  Once 07/13/20 1306 07/13/20 1644         Medications  Scheduled Meds: . sodium chloride   Intravenous Once  . acetaminophen  650 mg Oral Q6H  . amoxicillin-clavulanate  1 tablet Oral Q12H  . Chlorhexidine Gluconate Cloth  6 each Topical Daily  . enoxaparin (LOVENOX) injection  40 mg Subcutaneous Q24H  .  feeding supplement  237 mL Oral BID BM  . furosemide  40 mg Oral Daily  . methadone  100 mg Oral Daily  . polyethylene glycol  17 g Oral BID  . prenatal vitamin w/FE, FA  1 tablet Oral Q1200  . senna-docusate  1 tablet Oral BID  . sodium chloride flush  10-40 mL Intracatheter Q12H   Continuous Infusions: . vancomycin 750 mg (07/30/20 0808)   PRN Meds:.HYDROmorphone (DILAUDID) injection, magic mouthwash w/lidocaine, prochlorperazine, sodium chloride flush, sodium chloride flush  Subjective:   Aviyanna Wandler was seen and examined today.  Overnight issues noted with withdrawals, patient reported that she vomited methadone during the day and was experiencing withdrawals at night.  Received extra dose of methadone.  Currently feels back to baseline, resting comfortably.  No chest pain, shortness of breath, dizziness no nausea or vomiting.    Objective:   Vitals:   07/29/20 0847 07/29/20 0852 07/29/20 2242 07/30/20 0627  BP:  109/74 97/65 105/62  Pulse: 92 87 83 65  Resp:  18 18 18   Temp:   97.7 F (36.5 C) 98.1 F (36.7 C)  TempSrc:   Oral Oral  SpO2: 98%  99% 99%  Weight:    80.7 kg  Height:        Intake/Output Summary (Last 24 hours) at 07/30/2020 1352 Last data filed at 07/30/2020 0009 Gross per 24 hour  Intake 720 ml  Output --  Net 720 ml     Wt Readings from Last 3 Encounters:  07/30/20 80.7 kg  07/23/19 80.3 kg  05/21/19 78 kg   Physical Exam  General: Alert and oriented x 3, NAD.  Gravid uterus.    Cardiovascular: S1 S2, systolic murmur.  Respiratory: Clear to auscultation  Gastrointestinal: Gravid uterus.  Ext: no pedal edema bilaterally Neuro: Awake and alert.  Patient moves all extremities.    Data Reviewed:  I have personally reviewed following labs and imaging studies  Micro Results No results found for this or any previous visit (from the past 240 hour(s)).  Radiology Reports DG Orthopantogram  Result Date: 07/22/2020 CLINICAL  DATA:  Dental pain EXAM: ORTHOPANTOGRAM/PANORAMIC COMPARISON:  06/25/2018 FINDINGS: Negative for fracture. Interval extraction of right lower molars. Interval healing of periapical lucency. Multiple caries are present including the upper canine on the right, and left lower premolar and molar teeth. No periapical lucency identified. IMPRESSION: Multiple caries. Electronically Signed   By: Franchot Gallo M.D.   On: 07/22/2020 16:16   DG Chest 2 View  Result Date: 07/13/2020 CLINICAL DATA:  Shortness of breath EXAM: CHEST - 2 VIEW COMPARISON:  07/26/2018 FINDINGS: Low lung volumes. Bibasilar airspace opacities. Mild cardiomegaly. No effusions. No acute bony abnormality. IMPRESSION: Low lung volumes with bibasilar airspace opacities concerning for pneumonia. Mild cardiomegaly. Electronically Signed   By: Rolm Baptise M.D.   On: 07/13/2020 11:33   US RENAL  Result Date: 07/17/2020 CLINICAL DATA:  Bilateral flank pain.  Substance abuse. EXAM: RENAL / URINARY TRACT ULTRASOUND COMPLETE COMPARISON:  None. FINDINGS: Right Kidney: Renal measurements: 11.2 x 4.3 x 5.9 cm = volume: 145 mL. Echogenicity within normal limits. No mass or hydronephrosis visualized. Left Kidney: Renal measurements: 12.5 x 5.9 x 5.1 cm = volume: 196.3 mL. Echogenicity within normal limits. No mass or hydronephrosis visualized. Bladder: Not visualized. Other: None. IMPRESSION: 1. The kidneys are normal in appearance. The mild right hydronephrosis seen on an abdominal ultrasound from July 13, 2020 is not visualized today. Electronically Signed   By: Dorise Bullion III M.D   On: 07/17/2020 14:53   DG Chest Port 1 View  Result Date: 07/15/2020 CLINICAL DATA:  Abnormal respiration EXAM: PORTABLE CHEST 1 VIEW COMPARISON:  07/13/2020 FINDINGS: Pulmonary insufflation has slightly improved and is symmetric. Superimposed multifocal pulmonary infiltrates, however, have progressed slightly in the interval, most in keeping with atypical infection or  inflammation. No pneumothorax or pleural effusion. Cardiac size is at the upper limits of normal. Harrington rods again noted. IMPRESSION: Sided renal improvement in pulmonary insufflation. Progressive  multifocal pulmonary infiltrates most in keeping with atypical infection or inflammation. Electronically Signed   By: Fidela Salisbury MD   On: 07/15/2020 06:03   DG Foot Complete Right  Result Date: 07/15/2020 CLINICAL DATA:  RIGHT foot pain EXAM: RIGHT FOOT COMPLETE - 3+ VIEW COMPARISON:  None FINDINGS: Osseous mineralization normal. Joint spaces preserved. No acute fracture, dislocation, or bone destruction. IMPRESSION: Normal exam. Electronically Signed   By: Lavonia Dana M.D.   On: 07/15/2020 15:08   [place] MFM FETAL BPP WO NON STRESS  Result Date: 07/26/2020 ----------------------------------------------------------------------  OBSTETRICS REPORT                       (Signed Final 07/26/2020 01:37 pm) ---------------------------------------------------------------------- Patient Info  ID #:       [id number]                          D.O.B.:  May 04, 1986 (34 yrs)  Name:       Kelly [name]              Visit Date: 07/26/2020 10:33 am ---------------------------------------------------------------------- Performed By  Attending:        Sander Nephew      Ref. Address:     [address]                    MD                                                             [address]  Performed By:     Berlinda Last          [facility]                    [place]  Referred By:      Sloan Leiter                    MD ---------------------------------------------------------------------- Orders  #  Description                           Code        Ordered By  1  [place] MFM FETAL BPP WO NON               44967.59    KELLY DAVIS     STRESS ----------------------------------------------------------------------  #  Order #                     Accession #                 Episode #  1  [id number]                   [id number]                 [id number] ---------------------------------------------------------------------- Indications  Heart disease in mother affecting pregnancy  O99.413  in third trimester  [redacted] weeks gestation of pregnancy                Z3A.28  Hypertension - Chronic/Pre-existing            Q30.092  Drug use complicating pregnancy, third         O69.323  trimester  Advanced maternal age multigravida 110+,        O34.523  third trimester  Previous cesarean delivery, antepartum x1      O34.219 ---------------------------------------------------------------------- Fetal Evaluation  Num Of Fetuses:         1  Fetal Heart Rate(bpm):  150  Cardiac Activity:       Observed  Presentation:           Cephalic  Placenta:               Anterior  Amniotic Fluid  AFI FV:      Within normal limits  AFI Sum(cm)     %Tile       Largest Pocket(cm)  9.77            8           3.31  RUQ(cm)       RLQ(cm)       LUQ(cm)        LLQ(cm)  2.86          1.56          2.04           3.31 ---------------------------------------------------------------------- Biophysical Evaluation  Amniotic F.V:   Within normal limits       F. Tone:        Observed  F. Movement:    Observed                   Score:          8/8  F. Breathing:   Observed ---------------------------------------------------------------------- OB History  Gravidity:    7         Term:   3        Prem:   0        SAB:   3  TOP:          0       Ectopic:  0        Living: 3 ---------------------------------------------------------------------- Gestational Age  Best:          28w 0d     Det. By:  U/S  (07/13/20)          EDD:   10/18/20 ---------------------------------------------------------------------- Anatomy  Abdomen:               Appears normal         Bladder:                Appears normal ---------------------------------------------------------------------- Impression  Antenatal testing performed, inpatient for  mangement of CHF  for tricuspid endocarditis with septic emboli  The biophysical profile was 8/8 with good fetal movement and  amniotic fluid volume. ---------------------------------------------------------------------- Recommendations  Consider NST/AFI instead of BPP unless NST is nonreactive,  given fetal gestational age.  Continue weekly testing.  Serial growth every 4 weeks.  Management per inpatient providers ----------------------------------------------------------------------               Sander Nephew, MD Electronically Signed Final Report   07/26/2020 01:37 pm ----------------------------------------------------------------------  VAS [place] UPPER EXTREMITY VENOUS DUPLEX  Result Date: 07/28/2020 UPPER VENOUS STUDY  Indications:  Pain when arm is lifted above head with PICC line Other Indications: IVDU. Risk Factors: None identified. Anticoagulation: Lovenox. Limitations: PICC line upper arm. Comparison Study: Prev 9/19 Negative LLE Performing Technologist: Vonzell Schlatter RVT  Examination Guidelines: A complete evaluation includes B-mode imaging, spectral Doppler, color Doppler, and power Doppler as needed of all accessible portions of each vessel. Bilateral testing is considered an integral part of a complete examination. Limited examinations for reoccurring indications may be performed as noted.  Right Findings: +----------+------------+---------+-----------+----------+-------+ RIGHT     CompressiblePhasicitySpontaneousPropertiesSummary +----------+------------+---------+-----------+----------+-------+ IJV           Full       Yes       Yes                      +----------+------------+---------+-----------+----------+-------+ Subclavian    Full       Yes       Yes                      +----------+------------+---------+-----------+----------+-------+ Axillary      Full       Yes       Yes                      +----------+------------+---------+-----------+----------+-------+  Brachial      Full       Yes       Yes                      +----------+------------+---------+-----------+----------+-------+ Radial        Full                                          +----------+------------+---------+-----------+----------+-------+ Ulnar         Full                                          +----------+------------+---------+-----------+----------+-------+ Cephalic      Full                                          +----------+------------+---------+-----------+----------+-------+ Basilic       Full                                          +----------+------------+---------+-----------+----------+-------+  Summary:  Right: No evidence of deep vein thrombosis in the upper extremity. No evidence of superficial vein thrombosis in the upper extremity. However, unable to visualize the Brachial mid-distal due to PICC.  *See table(s) above for measurements and observations.  Diagnosing physician: Servando Snare MD Electronically signed by Servando Snare MD on 07/28/2020 at 5:59:32 PM.    Final    ECHOCARDIOGRAM LIMITED  Result Date: 07/13/2020    ECHOCARDIOGRAM REPORT   Patient Name:   Kelly [name] Date of Exam: 07/13/2020 Medical Rec #:  [id number]          Height:       67.5 in Accession #:    [id number]         Weight:  177.0 lb Date of Birth:  1985/06/15           BSA:          1.931 m Patient Age:    34 years           BP:           128/72 mmHg Patient Gender: F                  HR:           125 bpm. Exam Location:  Inpatient Procedure: Limited Color Doppler and Cardiac Doppler STAT ECHO Indications:     endocarditis  History:         Patient has prior history of Echocardiogram examinations, most                  recent 05/21/2019. Signs/Symptoms:Bacteremia, Dyspnea and Chest                  Pain; Risk Factors:IV drug use.  Sonographer:     Johny Chess Referring Phys:  [place] Diagnosing Phys: Loralie Champagne MD IMPRESSIONS  1. Left  ventricular ejection fraction, by estimation, is 55%. The left ventricle has normal function. The left ventricle has no regional wall motion abnormalities. Left ventricular diastolic parameters are indeterminate.  2. Right ventricular systolic function is moderately reduced. The right ventricular size is severely enlarged. There is severely elevated pulmonary artery systolic pressure. The estimated right ventricular systolic pressure is 82.9 mmHg.  3. Right atrial size was severely dilated.  4. A small pericardial effusion is present.  5. The mitral valve is normal in structure. No evidence of mitral valve regurgitation. No evidence of mitral stenosis.  6. The tricuspid valve is abnormal. Tricuspid valve regurgitation is severe.  7. The aortic valve is tricuspid. Aortic valve regurgitation is not visualized. No aortic stenosis is present.  8. The inferior vena cava is dilated in size with <50% respiratory variability, suggesting right atrial pressure of 15 mmHg.  9. No definite endocarditis was visualized. However, the right heart was dilated and dysfunctional with severe pulmonary hypertension and severe TR. FINDINGS  Left Ventricle: Left ventricular ejection fraction, by estimation, is 55%. The left ventricle has normal function. The left ventricle has no regional wall motion abnormalities. The left ventricular internal cavity size was normal in size. There is no left ventricular hypertrophy. Left ventricular diastolic parameters are indeterminate. Right Ventricle: The right ventricular size is severely enlarged. No increase in right ventricular wall thickness. Right ventricular systolic function is moderately reduced. There is severely elevated pulmonary artery systolic pressure. The tricuspid regurgitant velocity is 4.40 m/s, and with an assumed right atrial pressure of 15 mmHg, the estimated right ventricular systolic pressure is 93.7 mmHg. Left Atrium: Left atrial size was normal in size. Right Atrium: Right  atrial size was severely dilated. Pericardium: A small pericardial effusion is present. Mitral Valve: The mitral valve is normal in structure. No evidence of mitral valve regurgitation. No evidence of mitral valve stenosis. Tricuspid Valve: The tricuspid valve is abnormal. Tricuspid valve regurgitation is severe. Aortic Valve: The aortic valve is tricuspid. Aortic valve regurgitation is not visualized. No aortic stenosis is present. Pulmonic Valve: The pulmonic valve was normal in structure. Pulmonic valve regurgitation is not visualized. Aorta: The aortic root is normal in size and structure. Venous: The inferior vena cava is dilated in size with less than 50% respiratory variability, suggesting right atrial pressure of 15 mmHg. IAS/Shunts: No atrial  level shunt detected by color flow Doppler.  IVC IVC diam: 2.20 cm RIGHT ATRIUM           Index RA Area:     32.10 cm RA Volume:   136.00 ml 70.45 ml/m  TRICUSPID VALVE TR Peak grad:   77.4 mmHg TR Vmax:        440.00 cm/s Loralie Champagne MD Electronically signed by Loralie Champagne MD Signature Date/Time: 07/13/2020/5:19:31 PM    Final (Updated)    [place] EKG SITE RITE  Result Date: 07/23/2020 If Site Rite image not attached, placement could not be confirmed due to current cardiac rhythm.  [place] MFM OB COMP + 14 WK  Result Date: 07/13/2020 ----------------------------------------------------------------------  OBSTETRICS REPORT                       (Signed Final 07/13/2020 02:48 pm) ---------------------------------------------------------------------- Patient Info  ID #:       [id number]                          D.O.B.:  [birth year] (34 yrs)  Name:       Kelly [name]              Visit Date: 07/13/2020 02:08 pm ---------------------------------------------------------------------- Performed By  Attending:        Sander Nephew      Ref. Address:     [facility]                    MD                                                             GYN  Performed By:      Valda Favia          [facility]                    [place]  Referred By:      Annalee Genta DO ---------------------------------------------------------------------- Orders  #  Description                           Code        Ordered By  1  [place] MFM OB COMP + 14 WK                29937.16    Janyth Pupa ----------------------------------------------------------------------  #  Order #                     Accession #                Episode #  1  [id number]                   [id number]  [id number] ---------------------------------------------------------------------- Indications  [redacted] weeks gestation of pregnancy                Z3A.26  Encounter for uncertain dates                  P92.92  Drug use complicating pregnancy, third         O62.323  trimester  Advanced maternal age multigravida 109+,        O23.523  third trimester  Previous cesarean delivery, antepartum x1      O34.219  Abdominal pain in pregnancy                    O99.89 ---------------------------------------------------------------------- Fetal Evaluation  Num Of Fetuses:         1  Fetal Heart Rate(bpm):  169  Cardiac Activity:       Observed  Presentation:           Breech  Placenta:               Anterior  P. Cord Insertion:      Not well visualized  Amniotic Fluid  AFI FV:      Within normal limits                              Largest Pocket(cm)                              5.6 ---------------------------------------------------------------------- Biometry  BPD:      64.8  mm     G. Age:  26w 1d         41  %    CI:        75.49   %    70 - 86                                                          FL/HC:      18.8   %    18.6 - 20.4  HC:      236.5  mm     G. Age:  25w 5d         14  %    HC/AC:      1.01        1.04 - 1.22  AC:      234.2  mm     G. Age:  27w 5d         86  %    FL/BPD:     68.7   %    71 - 87  FL:       44.5  mm     G. Age:   24w 5d          5  %    FL/AC:      19.0   %    20 - 24  HUM:      42.2  mm     G. Age:  25w 3d         27  %  Est. FW:     930  gm      2 lb 1 oz     49  % ----------------------------------------------------------------------  OB History  Gravidity:    7         Term:   3        Prem:   0        SAB:   3  TOP:          0       Ectopic:  0        Living: 3 ---------------------------------------------------------------------- Gestational Age  U/S Today:     26w 1d                                        EDD:   10/18/20  Best:          26w 1d     Det. By:  U/S (07/13/20)           EDD:   10/18/20 ---------------------------------------------------------------------- Anatomy  Cranium:               Appears normal         LVOT:                   Not well visualized  Cavum:                 Appears normal         Aortic Arch:            Not well visualized  Ventricles:            Not well visualized    Ductal Arch:            Not well visualized  Choroid Plexus:        Appears normal         Diaphragm:              Appears normal  Cerebellum:            Not well visualized    Stomach:                Appears normal, left                                                                        sided  Posterior Fossa:       Not well visualized    Abdomen:                Appears normal  Nuchal Fold:           Not applicable (>16    Abdominal Wall:         Not well visualized                         wks GA)  Face:                  Not well visualized    Cord Vessels:           Appears normal (3  vessel cord)  Lips:                  Not well visualized    Kidneys:                Not well visualized  Palate:                Not well visualized    Bladder:                Appears normal  Thoracic:              Appears normal         Spine:                  Not well visualized  Heart:                 Not well visualized    Upper Extremities:      Visualized  RVOT:                   Not well visualized    Lower Extremities:      Visualized  Other:  Fetus appears to be a female. Technically difficult due to maternal          habitus and fetal position. ---------------------------------------------------------------------- Cervix Uterus Adnexa  Cervix  Not visualized (advanced GA >24wks)  Uterus  No abnormality visualized.  Right Ovary  No adnexal mass visualized.  Left Ovary  No adnexal mass visualized.  Cul De Sac  No free fluid seen.  Adnexa  No abnormality visualized. ---------------------------------------------------------------------- Impression  Single intrauterine pregnancy here for a detailed anatomy  due new onset abdominal pain and known substance abuse (  Heroine).  Normal anatomy with measurements consistent with today's  examination, Ms. Maciolek has an uncertain LMP.  There is good fetal movement and amniotic fluid volume  Suboptimal views of the fetal anatomy were obtained  secondary to fetal position and maternal ability to lay flat. ---------------------------------------------------------------------- Recommendations  Repeat growth and anatomy in 3 weeks. ----------------------------------------------------------------------               Sander Nephew, MD Electronically Signed Final Report   07/13/2020 02:48 pm ----------------------------------------------------------------------  [place] MFM OB LIMITED  Result Date: 07/20/2020 ----------------------------------------------------------------------  OBSTETRICS REPORT                       (Signed Final 07/20/2020 05:40 pm) ---------------------------------------------------------------------- Patient Info  ID #:       [id number]                          D.O.B.:  [birth year] (34 yrs)  Name:       Kelly [name]              Visit Date: 07/20/2020 11:46 am ---------------------------------------------------------------------- Performed By  Attending:        Johnell Comings MD         Ref. Address:     [address]  [place], [place]  Performed By:     Berlinda Last          [facility]                    [place]  Referred By:      Osborne Oman MD ---------------------------------------------------------------------- Orders  #  Description                           Code        Ordered By  1  [place] MFM OB LIMITED                     75102.58    Verita Schneiders ----------------------------------------------------------------------  #  Order #                     Accession #                Episode #  1  [id number]                   [id number]                 [id number] ---------------------------------------------------------------------- Indications  Drug use complicating pregnancy, second        O99.322  trimester  [redacted] weeks gestation of pregnancy                Z3A.27  Advanced maternal age multigravida 89+,        O54.522  second trimester  Previous cesarean delivery, antepartum x1      O34.219  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 Cord Insertion:      Marginal insertion  Amniotic Fluid  AFI FV:      Within normal limits                              Largest Pocket(cm)                              5.2 ---------------------------------------------------------------------- OB History  Gravidity:    7  Term:   3        Prem:   0        SAB:   3  TOP:          0       Ectopic:  0        Living: 3  ---------------------------------------------------------------------- Gestational Age  Best:          27w 1d     Det. By:  U/S  (07/13/20)          EDD:   10/18/20 ---------------------------------------------------------------------- Anatomy  Stomach:               Appears normal, left   Bladder:                Appears normal                         sided ---------------------------------------------------------------------- Cervix Uterus Adnexa  Cervix  Not visualized (advanced GA >24wks)  Adnexa  No abnormality visualized. 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  She is currently  treated with vancomycin and the plan is for her to complete a  6-week course of vancomycin as an inpatient.  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  She  is being followed by the heart failure team.  A limited ultrasound performed today shows that the fetus is  in the breech presentation.  There was normal amniotic fluid noted.  During the performance of her ultrasound today, a possible  dip in the fetal heart rate down to the low 100s was noted on  the M-mode Dopplers.  The actual heart rate was difficult to  decipher based on the M mode Doppler exam.  The patient cooperated with fetal heart rate monitoring this  afternoon.  The baseline fetal heart rate was noted to be in  the 140s, with moderate variability and small accelerations.  The heart rate tracing was reassuring for her gestational age.  Due to the reported risk of fetal mortality (10% to 15%) in  pregnant women with infectious endocarditis, the plan will be  to continue daily fetal heart rate monitoring for 20 minutes  daily while she is hospitalized (if patient is cooperative).  We  will start  weekly biophysical profiles and amniotic fluid checks  at 28 weeks. ----------------------------------------------------------------------                   Johnell Comings, MD Electronically Signed Final Report   07/20/2020 05:40 pm ----------------------------------------------------------------------  US Abdomen Limited RUQ (LIVER/GB)  Result Date: 07/13/2020 CLINICAL DATA:  Abdomen pain pregnant patient EXAM: ULTRASOUND ABDOMEN LIMITED RIGHT UPPER QUADRANT COMPARISON:  CT 01/15/2019 FINDINGS: Gallbladder: Surgically absent Common bile duct: Diameter: 3.4 mm Liver: No focal lesion identified. Within normal limits in parenchymal echogenicity. Portal vein is patent on color Doppler imaging with normal direction of blood flow towards the liver. Other: Mild right hydronephrosis. IMPRESSION: 1. Status post cholecystectomy. 2. Mild right hydronephrosis Electronically Signed   By: Donavan Foil M.D.   On: 07/13/2020 22:14    Lab Data:  CBC: Recent Labs  Lab 07/24/20 0348 07/26/20 0557 07/27/20 0356 07/29/20 0500 07/30/20 0500  WBC 12.4* 11.3* 10.8* 12.1* 9.6  HGB  9.6* 9.6* 9.3* 9.1* 9.0*  HCT 28.6* 28.9* 27.9* 27.8* 27.4*  MCV 89.7 90.0 89.7 89.4 89.3  PLT 442* 464* 470* 457* 244*   Basic Metabolic Panel: Recent Labs  Lab 07/25/20 0939 07/26/20 0557 07/27/20 0356 07/28/20 0626 07/29/20 0500 07/30/20 0500  NA 133* 132* 134*  --  130* 132*  K 4.4 4.3 4.2  --  4.0 3.9  CL 95* 96* 98  --  95* 97*  CO2 29 29 28   --  27 27  GLUCOSE 93 88 105* 86 85 77  BUN 11 12 16   --  17 14  CREATININE 0.50 0.51 0.46  --  0.52 0.51  CALCIUM 9.2 9.4 9.4  --  9.2 9.1  MG  --  1.8  --   --   --   --    GFR: Estimated Creatinine Clearance: 108.5 mL/min (by C-G formula based on SCr of 0.51 mg/dL). Liver Function Tests: No results for input(s): AST, ALT, ALKPHOS, BILITOT, PROT, ALBUMIN in the last 168 hours. No results for input(s): LIPASE, AMYLASE in the last 168 hours. No results for input(s): AMMONIA  in the last 168 hours. Coagulation Profile: No results for input(s): INR, PROTIME in the last 168 hours. Cardiac Enzymes: No results for input(s): CKTOTAL, CKMB, CKMBINDEX, TROPONINI in the last 168 hours. BNP (last 3 results) No results for input(s): PROBNP in the last 8760 hours. HbA1C: No results for input(s): HGBA1C in the last 72 hours. CBG: Recent Labs  Lab 07/25/20 0724 07/30/20 0608  GLUCAP 93 75   Lipid Profile: No results for input(s): CHOL, HDL, LDLCALC, TRIG, CHOLHDL, LDLDIRECT in the last 72 hours. Thyroid Function Tests: No results for input(s): TSH, T4TOTAL, FREET4, T3FREE, THYROIDAB in the last 72 hours. Anemia Panel: No results for input(s): VITAMINB12, FOLATE, FERRITIN, TIBC, IRON, RETICCTPCT in the last 72 hours. Urine analysis:    Component Value Date/Time   COLORURINE AMBER (A) 07/13/2020 1320   APPEARANCEUR CLOUDY (A) 07/13/2020 1320   APPEARANCEUR Hazy 07/10/2013 1121   LABSPEC 1.028 07/13/2020 1320   LABSPEC 1.020 07/10/2013 1121   PHURINE 5.0 07/13/2020 1320   GLUCOSEU NEGATIVE 07/13/2020 1320   GLUCOSEU Negative 07/10/2013 1121   HGBUR SMALL (A) 07/13/2020 1320   BILIRUBINUR NEGATIVE 07/13/2020 1320   BILIRUBINUR Negative 07/10/2013 1121   KETONESUR 5 (A) 07/13/2020 1320   PROTEINUR 100 (A) 07/13/2020 1320   UROBILINOGEN 1.0 02/15/2015 0530   NITRITE POSITIVE (A) 07/13/2020 1320   LEUKOCYTESUR LARGE (A) 07/13/2020 1320   LEUKOCYTESUR Trace 07/10/2013 1121     Bonnell Public M.D. [facility] 07/30/2020, 1:52 PM  Available via Epic secure chat 7am-7pm After 7 pm, please refer to night coverage provider listed on amion.

## 2020-07-30 NOTE — Progress Notes (Signed)
Pharmacy Antibiotic Note  Kelly [name] is a 35 y.o. female with MRSA bacteremia  And TV endocarditis. 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  Pharmacy has been consulted for vancomycin. Patient is pregnant with EGA of [redacted] weeks.  3/19: Vancomycin peak = 28, trough = 13. Calculated AUC = 502 (at goal). Stable from last levels drawn 3/11.   Patient currently afebrile, WBC trended down to 9.6. Scr stable 0.51.   Planning 6 weeks of inpatient IV antibiotics.  Plan: Continue vancomycin 750 mg IV q 8 hrs Monitor C&S, clinical status, renal function, vanc levels as needed  Temp (24hrs), Avg:97.9 F (36.6 C), Min:97.7 F (36.5 C), Max:98.1 F (36.7 C)  Recent Labs  Lab 07/24/20 0348 07/25/20 0939 07/26/20 0557 07/27/20 0356 07/29/20 0500 07/29/20 1830 07/30/20 0500 07/30/20 0730  WBC 12.4*  --  11.3* 10.8* 12.1*  --  9.6  --   CREATININE 0.48 0.50 0.51 0.46 0.52  --  0.51  --   VANCOTROUGH  --   --   --   --   --   --   --  13*  VANCOPEAK  --   --   --   --   --  28*  --   --     Estimated Creatinine Clearance: 108.5 mL/min (by C-G formula based on SCr of 0.51 mg/dL).    Allergies  Allergen Reactions  . Hydrocodone Itching  . Morphine And Related Nausea And Vomiting    Antimicrobials this admission: Azith 3/2 x1 CTX 3/2 x1 Vancomycin 3/2> Cefepime 3/2>3/3  Cultures: 3/2 urine - ng 3/2 Bld - GPC - MRSA 3/2 MRSA PCR pos 3/4 BCx x 2 > Neg  Dose adjustments this admission: 3/4: VT= 13 (actual likely a little lower; dose given ~ 1pm and trough ~ 7pm) Change vancomycin to 1265m [facility] q8[facility] 46; VT = 14; AUC = 747 on 1250 q 8 hrs > adjust to 750 q 8 hrs - new AUC est 446 3/11: VP = 29; VT = 13; AUC 508, continue 750 mg q8h 3/19: VP = 28, VT = 13; AUC 502, continue 750 mg q8h  Thank you for allowing pharmacy to be a part of this patient's care.  MRebbeca Paul PharmD PGY1 Pharmacy  Resident 07/30/2020 11:18 AM  Please check [email or web address] for unit-specific pharmacy phone numbers.

## 2020-07-30 NOTE — Progress Notes (Signed)
NST completed. Pt denies uc's, vaginal bleeding or leaking of fluid. Pt says she wants a tubal ligation this time. She wants to sign 30 day papers. Dr. Nelda Marseille made aware. Says she will work on getting the 30 day papers.

## 2020-07-31 DIAGNOSIS — B9562 Methicillin resistant Staphylococcus aureus infection as the cause of diseases classified elsewhere: Secondary | ICD-10-CM | POA: Diagnosis not present

## 2020-07-31 DIAGNOSIS — R7881 Bacteremia: Secondary | ICD-10-CM | POA: Diagnosis not present

## 2020-07-31 LAB — CBC
HCT: 26.3 % — ABNORMAL LOW (ref 36.0–46.0)
Hemoglobin: 8.9 g/dL — ABNORMAL LOW (ref 12.0–15.0)
MCH: 30.1 pg (ref 26.0–34.0)
MCHC: 33.8 g/dL (ref 30.0–36.0)
MCV: 88.9 fL (ref 80.0–100.0)
Platelets: 406 10*3/uL — ABNORMAL HIGH (ref 150–400)
RBC: 2.96 MIL/uL — ABNORMAL LOW (ref 3.87–5.11)
RDW: 13.7 % (ref 11.5–15.5)
WBC: 9.6 10*3/uL (ref 4.0–10.5)
nRBC: 0 % (ref 0.0–0.2)

## 2020-07-31 LAB — BASIC METABOLIC PANEL
Anion gap: 6 (ref 5–15)
BUN: 17 mg/dL (ref 6–20)
CO2: 27 mmol/L (ref 22–32)
Calcium: 9.2 mg/dL (ref 8.9–10.3)
Chloride: 97 mmol/L — ABNORMAL LOW (ref 98–111)
Creatinine, Ser: 0.7 mg/dL (ref 0.44–1.00)
GFR, Estimated: >60 mL/min (ref >60)
Glucose, Bld: 93 mg/dL (ref 70–99)
Potassium: 4.1 mmol/L (ref 3.5–5.1)
Sodium: 130 mmol/L — ABNORMAL LOW (ref 135–145)

## 2020-07-31 LAB — GLUCOSE, CAPILLARY
Glucose-Capillary: 85 mg/dL (ref 70–99)
Glucose-Capillary: 100 mg/dL — ABNORMAL HIGH (ref 70–99)

## 2020-07-31 NOTE — Progress Notes (Signed)
NST completed. Reactive tracing. No uc's. Pt denies abd pain, vaginal bleeding, or leaking of fluid. Dr. Harolyn Rutherford notified.

## 2020-07-31 NOTE — Progress Notes (Signed)
[facility] Consult Progress Note                                                                               Patient Demographics  Kelly [name], is a 35 y.o. female, DOB - 30-Dec-1985, YCX:[id number]  Admit date - 07/13/2020   Admitting Physician Kipp Brood, MD  Outpatient Primary MD for the patient is Pa, [facility]  Outpatient specialists:   LOS - 18  days   Medical records reviewed and are as summarized below:    Chief Complaint  Patient presents with  . 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 -Current active IV heroin user, uses about 3 g daily and IV fentanyl, presented to the ED with 3 to 4 weeks history of bilateral lower extremity edema associated with worsening dyspnea on exertion for 4 days, intermittent fever for several weeks and pain with deep inspiration across her right lateral chest and flank. -In the ED she was noted to be fluid overload, blood cultures were positive for MRSA and she was a started on IV vancomycin. -2D echo without obvious vegetation, TEE was deferred and plan is to treat empirically for endocarditis -Patient was also found to be 26 weeks Pregnancy. Currently 28 weeks pregnancy.   07/30/2020: Patient seen.  Available records reviewed.  Leg edema has improved significantly.  No shortness of breath.  Patient has since stable.  07/31/2020: Patient seen.  Patient remains stable.  Patient will need to complete course of IV antibiotics.   Assessment & Plan   28 weeks pregnancy -Management per primary service, OB  MRSA bacteremia with presumed endocarditis -Per ID recommendations, continue IV vancomycin -2D echo without obvious vegetations,  TEE deferred as empirically treating as endocarditis -Repeat blood cultures so far negative -On IV vancomycin x 6 weeks, afebrile, 07/31/2020: Complete course of antibiotics.   Acute on chronic right-sided heart failure with severe TR, severe pulmonary hypertension -2D echo showed decreased RV systolic function, elevated PA pressure 92, severe TR -Was followed by cardiology/CHF team, transition to Lasix oral on 3/12  -Negative balance of 8.7 L, continue Lasix 40 mg PO daily  Anemia of chronic disease -Baseline hemoglobin 8.5-9.5 - B12 445, folate 7.4, iron 32, ferritin 111 -Continue prenatal vitamins, H&H stable, 9.1  History of IV heroin and fentanyl abuse, chronic pain syndrome -Patient now started on methadone 100 mg daily on 3/15, she has previously tolerated 100 mg of methadone daily -Dilaudid decreased to 0.5- 1 mg every 4 hours as needed for severe/breakthrough pain -HCV antibody+, hep A, hep B negative -Overnight 3/18 heart rate increased to 120s, patient reported that she had vomited her methadone during the day and was having withdrawals at night, received extra dose of methadone at 2200 -Currently no complaints, feels back to her baseline, no acute nausea vomiting  Pneumonia, possibly septic pulmonary infarcts -Continue IV vancomycin  Syphilis -Positive RPR  with rash per ID, could be secondary syphilis -Penicillin 2.4MU IM given  Hyponatremia -Stable  Back pain -Unable to tolerate MRI, was discontinued, currently on IV antibiotics  Jaw pain, tooth pain, dental caries -Placed on Augmentin, multiple caries, outpatient follow-up with [facility]   Code Status: Full CODE STATUS DVT Prophylaxis:  enoxaparin (LOVENOX) injection 40 mg Start: 07/25/20 1700 Place TED hose Start: 07/13/20 1900 SCDs Start: 07/13/20 1653   Level of Care: Level of care: Progressive Family Communication: Discussed all imaging results, lab results, explained to the  patient   Disposition Plan:     Status is: Inpatient   Time Spent in minutes 25 minutes  Antimicrobials:   Anti-infectives (From admission, onward)   Start     Dose/Rate Route Frequency Ordered Stop   07/22/20 1045  amoxicillin-clavulanate (AUGMENTIN) 875-125 MG per tablet 1 tablet        1 tablet Oral Every 12 hours 07/22/20 0952 07/31/20 2359   07/18/20 0830  vancomycin (VANCOCIN) IVPB 750 mg/150 ml premix        750 mg 150 mL/hr over 60 Minutes Intravenous Every 8 hours 07/18/20 0750     07/16/20 0400  Vancomycin (VANCOCIN) 1,250 mg in sodium chloride 0.9 % 250 mL IVPB  Status:  Discontinued        1,250 mg 166.7 mL/hr over 90 Minutes Intravenous Every 8 hours 07/15/20 1944 07/18/20 0749   07/15/20 1715  penicillin g benzathine (BICILLIN LA) 1200000 UNIT/2ML injection 2.4 Million Units        2.4 Million Units Intramuscular  Once 07/15/20 1621 07/15/20 1747   07/14/20 0300  vancomycin (VANCOCIN) IVPB 1000 mg/200 mL premix        1,000 mg 200 mL/hr over 60 Minutes Intravenous Every 8 hours 07/13/20 1806 07/15/20 2059   07/13/20 1730  Vancomycin (VANCOCIN) 1,500 mg in sodium chloride 0.9 % 500 mL IVPB        1,500 mg 250 mL/hr over 120 Minutes Intravenous  Once 07/13/20 1709 07/13/20 2116   07/13/20 1730  ceFEPIme (MAXIPIME) 2 g in sodium chloride 0.9 % 100 mL IVPB  Status:  Discontinued        2 g 200 mL/hr over 30 Minutes Intravenous Every 8 hours 07/13/20 1709 07/14/20 1050   07/13/20 1315  cefTRIAXone (ROCEPHIN) 1 g in sodium chloride 0.9 % 100 mL IVPB        1 g 200 mL/hr over 30 Minutes Intravenous  Once 07/13/20 1306 07/13/20 1538   07/13/20 1315  azithromycin (ZITHROMAX) 500 mg in sodium chloride 0.9 % 250 mL IVPB        500 mg 250 mL/hr over 60 Minutes Intravenous  Once 07/13/20 1306 07/13/20 1644         Medications  Scheduled Meds: . sodium chloride   Intravenous Once  . acetaminophen  650 mg Oral Q6H  . amoxicillin-clavulanate  1 tablet Oral Q12H  .  Chlorhexidine Gluconate Cloth  6 each Topical Daily  . enoxaparin (LOVENOX) injection  40 mg Subcutaneous Q24H  . feeding supplement  237 mL Oral BID BM  . furosemide  40 mg Oral Daily  . methadone  100 mg Oral Daily  . polyethylene glycol  17 g Oral BID  . prenatal vitamin w/FE, FA  1 tablet Oral Q1200  . senna-docusate  1 tablet Oral BID  . sodium chloride flush  10-40 mL Intracatheter Q12H   Continuous Infusions: . vancomycin 750 mg (07/31/20 0857)   PRN  Meds:.HYDROmorphone (DILAUDID) injection, magic mouthwash w/lidocaine, prochlorperazine      Subjective:  No new complaints. No shortness of breath. No chest pain. No leg edema.  Objective:   Vitals:   07/30/20 0627 07/30/20 1654 07/30/20 2100 07/31/20 0500  BP: 105/62 109/70 104/67 91/63  Pulse: 65 95 65 78  Resp: 18 18 18 18   Temp: 98.1 F (36.7 C) 97.7 F (36.5 C) 97.9 F (36.6 C) 97.7 F (36.5 C)  TempSrc: Oral Oral Oral Oral  SpO2: 99% 94% 97% 98%  Weight: 80.7 kg   84.5 kg  Height:        Intake/Output Summary (Last 24 hours) at 07/31/2020 1713 Last data filed at 07/30/2020 2200 Gross per 24 hour  Intake 360 ml  Output --  Net 360 ml     Wt Readings from Last 3 Encounters:  07/31/20 84.5 kg  07/23/19 80.3 kg  05/21/19 78 kg   Physical Exam  General: Alert and oriented x 3, NAD.  Gravid uterus.    Cardiovascular: S1 S2, systolic murmur.  Respiratory: Clear to auscultation  Gastrointestinal: Gravid uterus.  Ext: no pedal edema bilaterally Neuro: Awake and alert.  Patient moves all extremities.    Data Reviewed:  I have personally reviewed following labs and imaging studies  Micro Results No results found for this or any previous visit (from the past 240 hour(s)).  Radiology Reports DG Orthopantogram  Result Date: 07/22/2020 CLINICAL DATA:  Dental pain EXAM: ORTHOPANTOGRAM/PANORAMIC COMPARISON:  06/25/2018 FINDINGS: Negative for fracture. Interval extraction of right lower molars.  Interval healing of periapical lucency. Multiple caries are present including the upper canine on the right, and left lower premolar and molar teeth. No periapical lucency identified. IMPRESSION: Multiple caries. Electronically Signed   By: Franchot Gallo M.D.   On: 07/22/2020 16:16   DG Chest 2 View  Result Date: 07/13/2020 CLINICAL DATA:  Shortness of breath EXAM: CHEST - 2 VIEW COMPARISON:  07/26/2018 FINDINGS: Low lung volumes. Bibasilar airspace opacities. Mild cardiomegaly. No effusions. No acute bony abnormality. IMPRESSION: Low lung volumes with bibasilar airspace opacities concerning for pneumonia. Mild cardiomegaly. Electronically Signed   By: Rolm Baptise M.D.   On: 07/13/2020 11:33   US RENAL  Result Date: 07/17/2020 CLINICAL DATA:  Bilateral flank pain.  Substance abuse. EXAM: RENAL / URINARY TRACT ULTRASOUND COMPLETE COMPARISON:  None. FINDINGS: Right Kidney: Renal measurements: 11.2 x 4.3 x 5.9 cm = volume: 145 mL. Echogenicity within normal limits. No mass or hydronephrosis visualized. Left Kidney: Renal measurements: 12.5 x 5.9 x 5.1 cm = volume: 196.3 mL. Echogenicity within normal limits. No mass or hydronephrosis visualized. Bladder: Not visualized. Other: None. IMPRESSION: 1. The kidneys are normal in appearance. The mild right hydronephrosis seen on an abdominal ultrasound from July 13, 2020 is not visualized today. Electronically Signed   By: Dorise Bullion III M.D   On: 07/17/2020 14:53   DG Chest Port 1 View  Result Date: 07/15/2020 CLINICAL DATA:  Abnormal respiration EXAM: PORTABLE CHEST 1 VIEW COMPARISON:  07/13/2020 FINDINGS: Pulmonary insufflation has slightly improved and is symmetric. Superimposed multifocal pulmonary infiltrates, however, have progressed slightly in the interval, most in keeping with atypical infection or inflammation. No pneumothorax or pleural effusion. Cardiac size is at the upper limits of normal. Harrington rods again noted. IMPRESSION: Sided renal  improvement in pulmonary insufflation. Progressive multifocal pulmonary infiltrates most in keeping with atypical infection or inflammation. Electronically Signed   By: Fidela Salisbury MD   On: 07/15/2020 06:03  DG Foot Complete Right  Result Date: 07/15/2020 CLINICAL DATA:  RIGHT foot pain EXAM: RIGHT FOOT COMPLETE - 3+ VIEW COMPARISON:  None FINDINGS: Osseous mineralization normal. Joint spaces preserved. No acute fracture, dislocation, or bone destruction. IMPRESSION: Normal exam. Electronically Signed   By: Lavonia Dana M.D.   On: 07/15/2020 15:08   [place] MFM FETAL BPP WO NON STRESS  Result Date: 07/26/2020 ----------------------------------------------------------------------  OBSTETRICS REPORT                       (Signed Final 07/26/2020 01:37 pm) ---------------------------------------------------------------------- Patient Info  ID #:       [id number]                          D.O.B.:  1986/01/20 (34 yrs)  Name:       Kelly [name]              Visit Date: 07/26/2020 10:33 am ---------------------------------------------------------------------- Performed By  Attending:        Sander Nephew      Ref. Address:     [place]                    MD                                                             Rd  Performed By:     Berlinda Last          [facility]                    [place]  Referred By:      Sloan Leiter                    MD ---------------------------------------------------------------------- Orders  #  Description                           Code        Ordered By  1  [place] MFM FETAL BPP WO NON               33295.18    KELLY DAVIS     STRESS ----------------------------------------------------------------------  #  Order #                     Accession #                Episode #  1  [id number]                   [id number]                 [id number]  ---------------------------------------------------------------------- Indications  Heart disease in mother affecting pregnancy    O99.413  in third trimester  [redacted] weeks gestation of pregnancy                Z3A.Clarence  Hypertension - Chronic/Pre-existing            K02.542  Drug use complicating pregnancy, third         O99.323  trimester  Advanced maternal age multigravida 48+,        O59.523  third trimester  Previous cesarean delivery, antepartum x1      O34.219 ---------------------------------------------------------------------- Fetal Evaluation  Num Of Fetuses:         1  Fetal Heart Rate(bpm):  150  Cardiac Activity:       Observed  Presentation:           Cephalic  Placenta:               Anterior  Amniotic Fluid  AFI FV:      Within normal limits  AFI Sum(cm)     %Tile       Largest Pocket(cm)  9.77            8           3.31  RUQ(cm)       RLQ(cm)       LUQ(cm)        LLQ(cm)  2.86          1.56          2.04           3.31 ---------------------------------------------------------------------- Biophysical Evaluation  Amniotic F.V:   Within normal limits       F. Tone:        Observed  F. Movement:    Observed                   Score:          8/8  F. Breathing:   Observed ---------------------------------------------------------------------- OB History  Gravidity:    7         Term:   3        Prem:   0        SAB:   3  TOP:          0       Ectopic:  0        Living: 3 ---------------------------------------------------------------------- Gestational Age  Best:          28w 0d     Det. By:  U/S  (07/13/20)          EDD:   10/18/20 ---------------------------------------------------------------------- Anatomy  Abdomen:               Appears normal         Bladder:                Appears normal ---------------------------------------------------------------------- Impression  Antenatal testing performed, inpatient for mangement of CHF  for tricuspid endocarditis with septic emboli  The biophysical profile was  8/8 with good fetal movement and  amniotic fluid volume. ---------------------------------------------------------------------- Recommendations  Consider NST/AFI instead of BPP unless NST is nonreactive,  given fetal gestational age.  Continue weekly testing.  Serial growth every 4 weeks.  Management per inpatient providers ----------------------------------------------------------------------               Sander Nephew, MD Electronically Signed Final Report   07/26/2020 01:37 pm ----------------------------------------------------------------------  VAS [place] UPPER EXTREMITY VENOUS DUPLEX  Result Date: 07/28/2020 UPPER VENOUS STUDY  Indications: Pain when arm is lifted above head with PICC line Other Indications: IVDU. Risk Factors: None identified. Anticoagulation: Lovenox. Limitations: PICC line upper arm. Comparison Study: Prev 9/19  Negative LLE Performing Technologist: Vonzell Schlatter RVT  Examination Guidelines: A complete evaluation includes B-mode imaging, spectral Doppler, color Doppler, and power Doppler as needed of all accessible portions of each vessel. Bilateral testing is considered an integral part of a complete examination. Limited examinations for reoccurring indications may be performed as noted.  Right Findings: +----------+------------+---------+-----------+----------+-------+ RIGHT     CompressiblePhasicitySpontaneousPropertiesSummary +----------+------------+---------+-----------+----------+-------+ IJV           Full       Yes       Yes                      +----------+------------+---------+-----------+----------+-------+ Subclavian    Full       Yes       Yes                      +----------+------------+---------+-----------+----------+-------+ Axillary      Full       Yes       Yes                      +----------+------------+---------+-----------+----------+-------+ Brachial      Full       Yes       Yes                       +----------+------------+---------+-----------+----------+-------+ Radial        Full                                          +----------+------------+---------+-----------+----------+-------+ Ulnar         Full                                          +----------+------------+---------+-----------+----------+-------+ Cephalic      Full                                          +----------+------------+---------+-----------+----------+-------+ Basilic       Full                                          +----------+------------+---------+-----------+----------+-------+  Summary:  Right: No evidence of deep vein thrombosis in the upper extremity. No evidence of superficial vein thrombosis in the upper extremity. However, unable to visualize the Brachial mid-distal due to PICC.  *See table(s) above for measurements and observations.  Diagnosing physician: Servando Snare MD Electronically signed by Servando Snare MD on 07/28/2020 at 5:59:32 PM.    Final    ECHOCARDIOGRAM LIMITED  Result Date: 07/13/2020    ECHOCARDIOGRAM REPORT   Patient Name:   Kelly [name] Date of Exam: 07/13/2020 Medical Rec #:  [id number]          Height:       67.5 in Accession #:    [id number]         Weight:       177.0 lb Date of Birth:  April 09, 1986           BSA:  1.931 m Patient Age:    34 years           BP:           128/72 mmHg Patient Gender: F                  HR:           125 bpm. Exam Location:  Inpatient Procedure: Limited Color Doppler and Cardiac Doppler STAT ECHO Indications:     endocarditis  History:         Patient has prior history of Echocardiogram examinations, most                  recent 05/21/2019. Signs/Symptoms:Bacteremia, Dyspnea and Chest                  Pain; Risk Factors:IV drug use.  Sonographer:     Johny Chess Referring Phys:  [place] Diagnosing Phys: Loralie Champagne MD IMPRESSIONS  1. Left ventricular ejection fraction, by estimation, is 55%. The left ventricle  has normal function. The left ventricle has no regional wall motion abnormalities. Left ventricular diastolic parameters are indeterminate.  2. Right ventricular systolic function is moderately reduced. The right ventricular size is severely enlarged. There is severely elevated pulmonary artery systolic pressure. The estimated right ventricular systolic pressure is 73.5 mmHg.  3. Right atrial size was severely dilated.  4. A small pericardial effusion is present.  5. The mitral valve is normal in structure. No evidence of mitral valve regurgitation. No evidence of mitral stenosis.  6. The tricuspid valve is abnormal. Tricuspid valve regurgitation is severe.  7. The aortic valve is tricuspid. Aortic valve regurgitation is not visualized. No aortic stenosis is present.  8. The inferior vena cava is dilated in size with <50% respiratory variability, suggesting right atrial pressure of 15 mmHg.  9. No definite endocarditis was visualized. However, the right heart was dilated and dysfunctional with severe pulmonary hypertension and severe TR. FINDINGS  Left Ventricle: Left ventricular ejection fraction, by estimation, is 55%. The left ventricle has normal function. The left ventricle has no regional wall motion abnormalities. The left ventricular internal cavity size was normal in size. There is no left ventricular hypertrophy. Left ventricular diastolic parameters are indeterminate. Right Ventricle: The right ventricular size is severely enlarged. No increase in right ventricular wall thickness. Right ventricular systolic function is moderately reduced. There is severely elevated pulmonary artery systolic pressure. The tricuspid regurgitant velocity is 4.40 m/s, and with an assumed right atrial pressure of 15 mmHg, the estimated right ventricular systolic pressure is 32.9 mmHg. Left Atrium: Left atrial size was normal in size. Right Atrium: Right atrial size was severely dilated. Pericardium: A small pericardial  effusion is present. Mitral Valve: The mitral valve is normal in structure. No evidence of mitral valve regurgitation. No evidence of mitral valve stenosis. Tricuspid Valve: The tricuspid valve is abnormal. Tricuspid valve regurgitation is severe. Aortic Valve: The aortic valve is tricuspid. Aortic valve regurgitation is not visualized. No aortic stenosis is present. Pulmonic Valve: The pulmonic valve was normal in structure. Pulmonic valve regurgitation is not visualized. Aorta: The aortic root is normal in size and structure. Venous: The inferior vena cava is dilated in size with less than 50% respiratory variability, suggesting right atrial pressure of 15 mmHg. IAS/Shunts: No atrial level shunt detected by color flow Doppler.  IVC IVC diam: 2.20 cm RIGHT ATRIUM           Index RA  Area:     32.10 cm RA Volume:   136.00 ml 70.45 ml/m  TRICUSPID VALVE TR Peak grad:   77.4 mmHg TR Vmax:        440.00 cm/s Loralie Champagne MD Electronically signed by Loralie Champagne MD Signature Date/Time: 07/13/2020/5:19:31 PM    Final (Updated)    [place] EKG SITE RITE  Result Date: 07/23/2020 If Site Rite image not attached, placement could not be confirmed due to current cardiac rhythm.  [place] MFM OB COMP + 14 WK  Result Date: 07/13/2020 ----------------------------------------------------------------------  OBSTETRICS REPORT                       (Signed Final 07/13/2020 02:48 pm) ---------------------------------------------------------------------- Patient Info  ID #:       [id number]                          D.O.B.:  02/13/1986 (34 yrs)  Name:       Kelly [name]              Visit Date: 07/13/2020 02:08 pm ---------------------------------------------------------------------- Performed By  Attending:        Sander Nephew      Ref. Address:     [facility]                    MD                                                             GYN  Performed By:     Valda Favia          [facility]                     [place]  Referred By:      Annalee Genta DO ---------------------------------------------------------------------- Orders  #  Description                           Code        Ordered By  1  [place] MFM OB COMP + 14 WK                19379.02    Janyth Pupa ----------------------------------------------------------------------  #  Order #                     Accession #                Episode #  1  [id number]                   [id number]                 [id number] ---------------------------------------------------------------------- Indications  [redacted] weeks gestation of pregnancy  Z3A.26  Encounter for uncertain dates                  D32.67  Drug use complicating pregnancy, third         O30.323  trimester  Advanced maternal age multigravida 54+,        O43.523  third trimester  Previous cesarean delivery, antepartum x1      O34.219  Abdominal pain in pregnancy                    O99.89 ---------------------------------------------------------------------- Fetal Evaluation  Num Of Fetuses:         1  Fetal Heart Rate(bpm):  169  Cardiac Activity:       Observed  Presentation:           Breech  Placenta:               Anterior  P. Cord Insertion:      Not well visualized  Amniotic Fluid  AFI FV:      Within normal limits                              Largest Pocket(cm)                              5.6 ---------------------------------------------------------------------- Biometry  BPD:      64.8  mm     G. Age:  26w 1d         41  %    CI:        75.49   %    70 - 86                                                          FL/HC:      18.8   %    18.6 - 20.4  HC:      236.5  mm     G. Age:  25w 5d         14  %    HC/AC:      1.01        1.04 - 1.22  AC:      234.2  mm     G. Age:  27w 5d         86  %    FL/BPD:     68.7   %    71 - 87  FL:       44.5  mm     G. Age:  24w 5d          5  %    FL/AC:      19.0   %    20 - 24  HUM:       42.2  mm     G. Age:  25w 3d         27  %  Est. FW:     930  gm      2 lb 1 oz     49  % ---------------------------------------------------------------------- OB History  Gravidity:    7         Term:   3  Prem:   0        SAB:   3  TOP:          0       Ectopic:  0        Living: 3 ---------------------------------------------------------------------- Gestational Age  U/S Today:     26w 1d                                        EDD:   10/18/20  Best:          26w 1d     Det. By:  U/S (07/13/20)           EDD:   10/18/20 ---------------------------------------------------------------------- Anatomy  Cranium:               Appears normal         LVOT:                   Not well visualized  Cavum:                 Appears normal         Aortic Arch:            Not well visualized  Ventricles:            Not well visualized    Ductal Arch:            Not well visualized  Choroid Plexus:        Appears normal         Diaphragm:              Appears normal  Cerebellum:            Not well visualized    Stomach:                Appears normal, left                                                                        sided  Posterior Fossa:       Not well visualized    Abdomen:                Appears normal  Nuchal Fold:           Not applicable (>53    Abdominal Wall:         Not well visualized                         wks GA)  Face:                  Not well visualized    Cord Vessels:           Appears normal (3  vessel cord)  Lips:                  Not well visualized    Kidneys:                Not well visualized  Palate:                Not well visualized    Bladder:                Appears normal  Thoracic:              Appears normal         Spine:                  Not well visualized  Heart:                 Not well visualized    Upper Extremities:      Visualized  RVOT:                  Not well visualized    Lower Extremities:       Visualized  Other:  Fetus appears to be a female. Technically difficult due to maternal          habitus and fetal position. ---------------------------------------------------------------------- Cervix Uterus Adnexa  Cervix  Not visualized (advanced GA >24wks)  Uterus  No abnormality visualized.  Right Ovary  No adnexal mass visualized.  Left Ovary  No adnexal mass visualized.  Cul De Sac  No free fluid seen.  Adnexa  No abnormality visualized. ---------------------------------------------------------------------- Impression  Single intrauterine pregnancy here for a detailed anatomy  due new onset abdominal pain and known substance abuse (  Heroine).  Normal anatomy with measurements consistent with today's  examination, Ms. Kleinschmidt has an uncertain LMP.  There is good fetal movement and amniotic fluid volume  Suboptimal views of the fetal anatomy were obtained  secondary to fetal position and maternal ability to lay flat. ---------------------------------------------------------------------- Recommendations  Repeat growth and anatomy in 3 weeks. ----------------------------------------------------------------------               Sander Nephew, MD Electronically Signed Final Report   07/13/2020 02:48 pm ----------------------------------------------------------------------  [place] MFM OB LIMITED  Result Date: 07/20/2020 ----------------------------------------------------------------------  OBSTETRICS REPORT                       (Signed Final 07/20/2020 05:40 pm) ---------------------------------------------------------------------- Patient Info  ID #:       [id number]                          D.O.B.:  1985/06/02 (34 yrs)  Name:       Kelly [name]              Visit Date: 07/20/2020 11:46 am ---------------------------------------------------------------------- Performed By  Attending:        Johnell Comings MD         Ref. Address:     [address]  [place], [place]  Performed By:     Berlinda Last          [facility]                    [place]  Referred By:      Osborne Oman MD ---------------------------------------------------------------------- Orders  #  Description                           Code        Ordered By  1  [place] MFM OB LIMITED                     81017.51    Verita Schneiders ----------------------------------------------------------------------  #  Order #                     Accession #                Episode #  1  [id number]                   [id number]                 [id number] ---------------------------------------------------------------------- Indications  Drug use complicating pregnancy, second        O99.322  trimester  [redacted] weeks gestation of pregnancy                Z3A.27  Advanced maternal age multigravida 66+,        O86.522  second trimester  Previous cesarean delivery, antepartum x1      O34.219  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 Cord Insertion:      Marginal insertion  Amniotic Fluid  AFI FV:      Within normal limits                              Largest Pocket(cm)                              5.2 ---------------------------------------------------------------------- OB History  Gravidity:    7  Term:   3        Prem:   0        SAB:   3  TOP:          0       Ectopic:  0        Living: 3 ---------------------------------------------------------------------- Gestational  Age  Best:          27w 1d     Det. By:  U/S  (07/13/20)          EDD:   10/18/20 ---------------------------------------------------------------------- Anatomy  Stomach:               Appears normal, left   Bladder:                Appears normal                         sided ---------------------------------------------------------------------- Cervix Uterus Adnexa  Cervix  Not visualized (advanced GA >24wks)  Adnexa  No abnormality visualized. 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  She is currently  treated with vancomycin and the plan is for her to complete a  6-week course of vancomycin as an inpatient.  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  She  is being followed by the heart failure team.  A limited ultrasound performed today shows that the fetus is  in the breech presentation.  There was normal amniotic fluid noted.  During the performance of her ultrasound today, a possible  dip in the fetal heart rate down to the low 100s was noted on  the M-mode Dopplers.  The actual heart rate was difficult to  decipher based on the M mode Doppler exam.  The patient cooperated with fetal heart rate monitoring this  afternoon.  The baseline fetal heart rate was noted to be in  the 140s, with moderate variability and small accelerations.  The heart rate tracing was reassuring for her gestational age.  Due to the reported risk of fetal mortality (10% to 15%) in  pregnant women with infectious endocarditis, the plan will be  to continue daily fetal heart rate monitoring for 20 minutes  daily while she is hospitalized (if patient is cooperative).  We  will start weekly biophysical profiles and amniotic fluid checks  at 28 weeks.  ----------------------------------------------------------------------                   Johnell Comings, MD Electronically Signed Final Report   07/20/2020 05:40 pm ----------------------------------------------------------------------  US Abdomen Limited RUQ (LIVER/GB)  Result Date: 07/13/2020 CLINICAL DATA:  Abdomen pain pregnant patient EXAM: ULTRASOUND ABDOMEN LIMITED RIGHT UPPER QUADRANT COMPARISON:  CT 01/15/2019 FINDINGS: Gallbladder: Surgically absent Common bile duct: Diameter: 3.4 mm Liver: No focal lesion identified. Within normal limits in parenchymal echogenicity. Portal vein is patent on color Doppler imaging with normal direction of blood flow towards the liver. Other: Mild right hydronephrosis. IMPRESSION: 1. Status post cholecystectomy. 2. Mild right hydronephrosis Electronically Signed   By: Donavan Foil M.D.   On: 07/13/2020 22:14    Lab Data:  CBC: Recent Labs  Lab 07/26/20 0557 07/27/20 0356 07/29/20 0500 07/30/20 0500 07/31/20 0213  WBC 11.3* 10.8* 12.1* 9.6 9.6  HGB  9.6* 9.3* 9.1* 9.0* 8.9*  HCT 28.9* 27.9* 27.8* 27.4* 26.3*  MCV 90.0 89.7 89.4 89.3 88.9  PLT 464* 470* 457* 413* 494*   Basic Metabolic Panel: Recent Labs  Lab 07/26/20 0557 07/27/20 0356 07/28/20 0626 07/29/20 0500 07/30/20 0500 07/31/20 0213  NA 132* 134*  --  130* 132* 130*  K 4.3 4.2  --  4.0 3.9 4.1  CL 96* 98  --  95* 97* 97*  CO2 29 28  --  27 27 27   GLUCOSE 88 105* 86 85 77 93  BUN 12 16  --  17 14 17   CREATININE 0.51 0.46  --  0.52 0.51 0.70  CALCIUM 9.4 9.4  --  9.2 9.1 9.2  MG 1.8  --   --   --   --   --    GFR: Estimated Creatinine Clearance: 110.8 mL/min (by C-G formula based on SCr of 0.7 mg/dL). Liver Function Tests: No results for input(s): AST, ALT, ALKPHOS, BILITOT, PROT, ALBUMIN in the last 168 hours. No results for input(s): LIPASE, AMYLASE in the last 168 hours. No results for input(s): AMMONIA in the last 168 hours. Coagulation Profile: No results for input(s):  INR, PROTIME in the last 168 hours. Cardiac Enzymes: No results for input(s): CKTOTAL, CKMB, CKMBINDEX, TROPONINI in the last 168 hours. BNP (last 3 results) No results for input(s): PROBNP in the last 8760 hours. HbA1C: No results for input(s): HGBA1C in the last 72 hours. CBG: Recent Labs  Lab 07/25/20 0724 07/30/20 0608 07/30/20 2016 07/31/20 0610  GLUCAP 93 75 79 85   Lipid Profile: No results for input(s): CHOL, HDL, LDLCALC, TRIG, CHOLHDL, LDLDIRECT in the last 72 hours. Thyroid Function Tests: No results for input(s): TSH, T4TOTAL, FREET4, T3FREE, THYROIDAB in the last 72 hours. Anemia Panel: No results for input(s): VITAMINB12, FOLATE, FERRITIN, TIBC, IRON, RETICCTPCT in the last 72 hours. Urine analysis:    Component Value Date/Time   COLORURINE AMBER (A) 07/13/2020 1320   APPEARANCEUR CLOUDY (A) 07/13/2020 1320   APPEARANCEUR Hazy 07/10/2013 1121   LABSPEC 1.028 07/13/2020 1320   LABSPEC 1.020 07/10/2013 1121   PHURINE 5.0 07/13/2020 1320   GLUCOSEU NEGATIVE 07/13/2020 1320   GLUCOSEU Negative 07/10/2013 1121   HGBUR SMALL (A) 07/13/2020 1320   BILIRUBINUR NEGATIVE 07/13/2020 1320   BILIRUBINUR Negative 07/10/2013 1121   KETONESUR 5 (A) 07/13/2020 1320   PROTEINUR 100 (A) 07/13/2020 1320   UROBILINOGEN 1.0 02/15/2015 0530   NITRITE POSITIVE (A) 07/13/2020 1320   LEUKOCYTESUR LARGE (A) 07/13/2020 1320   LEUKOCYTESUR Trace 07/10/2013 1121     Bonnell Public M.D. [facility] 07/31/2020, 5:13 PM  Available via Epic secure chat 7am-7pm After 7 pm, please refer to night coverage provider listed on amion.

## 2020-08-01 DIAGNOSIS — O99413 Diseases of the circulatory system complicating pregnancy, third trimester: Secondary | ICD-10-CM | POA: Diagnosis not present

## 2020-08-01 DIAGNOSIS — O99891 Other specified diseases and conditions complicating pregnancy: Secondary | ICD-10-CM | POA: Diagnosis not present

## 2020-08-01 DIAGNOSIS — B9562 Methicillin resistant Staphylococcus aureus infection as the cause of diseases classified elsewhere: Secondary | ICD-10-CM | POA: Diagnosis not present

## 2020-08-01 DIAGNOSIS — Z3A28 28 weeks gestation of pregnancy: Secondary | ICD-10-CM | POA: Diagnosis not present

## 2020-08-01 DIAGNOSIS — R7881 Bacteremia: Secondary | ICD-10-CM | POA: Diagnosis not present

## 2020-08-01 LAB — GLUCOSE, CAPILLARY
Glucose-Capillary: 129 mg/dL — ABNORMAL HIGH (ref 70–99)
Glucose-Capillary: 101 mg/dL — ABNORMAL HIGH (ref 70–99)
Glucose-Capillary: 111 mg/dL — ABNORMAL HIGH (ref 70–99)

## 2020-08-01 NOTE — Plan of Care (Signed)

## 2020-08-01 NOTE — Progress Notes (Addendum)
Advanced Heart Failure Rounding Note   Subjective:    Improving. Breathing is "much better".   She was laying supine in bed when I walked in room. Appeared comfortable.    No complaints today.   Objective:   Weight Range:  Vital Signs:   Temp:  [97.7 F (36.5 C)-98 F (36.7 C)] 97.9 F (36.6 C) (03/21 0708) Pulse Rate:  [73-88] 88 (03/21 0952) Resp:  [18] 18 (03/21 0708) BP: (88-108)/(51-68) 107/67 (03/21 0952) SpO2:  [95 %-98 %] 98 % (03/21 0708) Weight:  [81.9 kg] 81.9 kg (03/21 0638) Last BM Date: 08/01/20  Weight change: Filed Weights   07/30/20 0627 07/31/20 0500 08/01/20 9379  Weight: 80.7 kg 84.5 kg 81.9 kg    Intake/Output:   Intake/Output Summary (Last 24 hours) at 08/01/2020 1544 Last data filed at 08/01/2020 0945 Gross per 24 hour  Intake 630 ml  Output --  Net 630 ml      PHYSICAL EXAM: General:  Young female but looks much older than actual age. No respiratory difficulty HEENT: normal Neck: supple. JVD 8 cm. Carotids 2+ bilat; no bruits. No lymphadenopathy or thyromegaly appreciated. Cor: PMI nondisplaced. Regular rate & rhythm. 2/6 TR murmur. Lungs: clear Abdomen: pregnant. No hepatosplenomegaly. No bruits or masses. Good bowel sounds. Extremities: no cyanosis, clubbing, rash, edema Neuro: alert & oriented x 3, cranial nerves grossly intact. moves all 4 extremities w/o difficulty. Affect pleasant.   Telemetry: NSR 90s  personally reviewed.   Labs: Basic Metabolic Panel: Recent Labs  Lab 07/26/20 0557 07/27/20 0356 07/28/20 0626 07/29/20 0500 07/30/20 0500 07/31/20 0213  NA 132* 134*  --  130* 132* 130*  K 4.3 4.2  --  4.0 3.9 4.1  CL 96* 98  --  95* 97* 97*  CO2 29 28  --  27 27 27   GLUCOSE 88 105* 86 85 77 93  BUN 12 16  --  17 14 17   CREATININE 0.51 0.46  --  0.52 0.51 0.70  CALCIUM 9.4 9.4  --  9.2 9.1 9.2  MG 1.8  --   --   --   --   --     Liver Function Tests: No results for input(s): AST, ALT, ALKPHOS, BILITOT,  PROT, ALBUMIN in the last 168 hours. No results for input(s): LIPASE, AMYLASE in the last 168 hours. No results for input(s): AMMONIA in the last 168 hours.  CBC: Recent Labs  Lab 07/26/20 0557 07/27/20 0356 07/29/20 0500 07/30/20 0500 07/31/20 0213  WBC 11.3* 10.8* 12.1* 9.6 9.6  HGB 9.6* 9.3* 9.1* 9.0* 8.9*  HCT 28.9* 27.9* 27.8* 27.4* 26.3*  MCV 90.0 89.7 89.4 89.3 88.9  PLT 464* 470* 457* 413* 406*    Cardiac Enzymes: No results for input(s): CKTOTAL, CKMB, CKMBINDEX, TROPONINI in the last 168 hours.  BNP: BNP (last 3 results) Recent Labs    07/13/20 1219  BNP 157.1*    ProBNP (last 3 results) No results for input(s): PROBNP in the last 8760 hours.    Other results:  Imaging: No results found.   Medications:     Scheduled Medications: . sodium chloride   Intravenous Once  . acetaminophen  650 mg Oral Q6H  . Chlorhexidine Gluconate Cloth  6 each Topical Daily  . enoxaparin (LOVENOX) injection  40 mg Subcutaneous Q24H  . feeding supplement  237 mL Oral BID BM  . furosemide  40 mg Oral Daily  . methadone  100 mg Oral Daily  . polyethylene  glycol  17 g Oral BID  . prenatal vitamin w/FE, FA  1 tablet Oral Q1200  . senna-docusate  1 tablet Oral BID  . sodium chloride flush  10-40 mL Intracatheter Q12H    Infusions: . vancomycin 750 mg (08/01/20 1006)    PRN Medications: HYDROmorphone (DILAUDID) injection, magic mouthwash w/lidocaine, prochlorperazine   Assessment/Plan:   1. MRSA Sepsis  - bcx 2/2 + for MRSA - clinically improved with IV abx and volume resuscitation.  - ID has seen and adjusting abx. Will need 6 weeks.  - Bld Cx 3/4 - No growth.  - no obvious vegetation on valve on TTE but assume she has recurrent endocarditis and would treat for 6 weeks with IV abx. D/w ID and both agree that TEE would not change management at this point as valve known to be heavily damaged from previous episodes and currently not candidate for operative repair.   -Plan to keep in the hospital until IV antibiotics completed. 08/19/20  2. Acute on Chronic Systolic CHF with Mostly Right Sided CHF (Likely Secondary to Severe TR) - Echo 07/13/20 showed normalization of LV function  LVEF of 55% with normal wall motion. RV severely enlarged with moderately reduced systolic function and severely elevated PASP of 92.37mHg. Severe TR noted but no vegetation visualized.  - Volume status stable. Breathing improved  - Continue PO Lasix 40 mg daily  - Renal function stable.   3. 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 He was seen by Dr. GServando Snarein 05/2019 for possible surgical repair. However, patient was lost to follow-up and unfortunately started using drugs again. - Echo this admission shows severe TR but no signs of vegetation.  4. Pregnancy  - 28w 3d pregnant - u/s ok - OB following - Case d/w high-risk OB team who is following with daily fetal monitoring  5. RPR + - ID sent confirmatory test - Given penicillin.   6. Polysubstance Abuse - Had using IV heroin + fentanyl - On methadone.   7 Hyponatremia - likely due to hypervolemia - 130 on yesterday's lab   Length of Stay: [address]SLadoris Gene3/21/2022, 3:44 PM  Advanced Heart Failure Team Pager [phone](M-F; [place]  Please contact [email or web address] for night-coverage after hours (4p -7a ) and weekends on [email or web address]   Patient seen and examined with the above-signed Advanced Practice Provider and/or Housestaff. I personally reviewed laboratory data, imaging studies and relevant notes. I independently examined the patient and formulated the important aspects of the plan. I have edited the note to reflect any of my changes or salient points. I have personally discussed the plan with the patient and/or family.  Denies SOB, orthopnea or PND.  General:  Well appearing. No  resp difficulty HEENT: normal Neck: supple. no JVD. Carotids 2+ bilat; no bruits. No lymphadenopathy or thryomegaly appreciated. Cor: PMI nondisplaced. Regular rate & rhythm. 2/6 TR Lungs: clear Abdomen: soft, gravid. No hepatosplenomegaly. No bruits or masses. Good bowel sounds. Extremities: no cyanosis, clubbing, rash, edema Neuro: alert & orientedx3, cranial nerves grossly intact. moves all 4 extremities w/o difficulty. Affect pleasant  Doing well from cardiac standpoint. Volume status looks good. Creatinine stable. Fetus doing well. Would continue lasix 40 daily for now. Can cut back as needed. Continue IV abx.   DGlori Bickers MD  10:30 PM

## 2020-08-01 NOTE — Progress Notes (Signed)
CBG 129

## 2020-08-01 NOTE — Progress Notes (Signed)
FDaily NST initiated.  Pt with no complaints.  Asleep and easily aroused.  VSS.

## 2020-08-01 NOTE — Progress Notes (Signed)
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 -Current active IV heroin user, uses about 3 g daily and IV fentanyl, presented to the ED with 3 to 4 weeks history of bilateral lower extremity edema associated with worsening dyspnea on exertion for 4 days, intermittent fever for several weeks and pain with deep inspiration across her right lateral chest and flank. -In the ED she was noted to have fluid overload, blood cultures were positive for MRSA and she was a started on IV vancomycin. -2D echo without obvious vegetation, TEE was deferred and plan is to treat empirically for endocarditis ( 6 weeks of vancomycin) -Patient was also found to be [redacted] weeks Pregnant.Currently 28 weeks pregnancy.  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)   Tricuspid regurgitation   Supervision of other high risk pregnancies, third trimester   Preexisting hypertension complicating pregnancy, antepartum   Dental caries   Endocarditis of tricuspid valve   History of VBAC   Lumbar back pain   Unwanted fertility   Acute on chronic congestive heart failure (HCC)   Cellulitis of right lower extremity   Septic embolism (HCC)   Syphilis   [redacted] weeks gestation of pregnancy   Flank pain   Drug use affecting pregnancy in second  trimester   Methadone maintenance treatment affecting pregnancy ([place])   Maternal endocarditis complicating pregnancy in third trimester   Pain, dental   28 weeks pregnancy -Management per primary service, OB  MRSA bacteremia with presumed endocarditis : -Per ID recommendations, continue IV vancomycin -2D echo without obvious vegetations, TEE deferred as empirically treating as endocarditis -Repeat blood cultures so far negative -On IV vancomycin x 6 weeks, afebrile, 07/31/2020: Complete course of antibiotics. She will remain in hospital to complete 6 weeks of antibiotics.  Acute on chronic right-sided heart failure with severe TR, severe pulmonary hypertension -2D echo showed decreased RV systolic function, elevated PA pressure 92, severe TR - She is being followed by cardiology/CHF team, transition to Lasix oral on 3/12  - Negative balance of 8.7 L, continue Lasix 40 mg PO daily  Anemia of chronic disease - Baseline hemoglobin 8.5-9.5 - B12 445, folate 7.4, iron 32, ferritin 111 -Continue prenatal vitamins, H&H stable, 8.9  History of IV heroin and fentanyl abuse, chronic pain syndrome -Patient now started on methadone 100 mg daily on 3/15, she has previously tolerated 100 mg of methadone daily -Dilaudid decreased to 0.5- 1 mg every 4 hours as needed for severe/breakthrough pain -HCV antibody+, hep A, hep B negative -Overnight 3/18 heart rate increased to 120s, patient reported that she had vomited her methadone during the day and was having withdrawals at night, received extra dose of methadone at 2200 -Currently no complaints, feels back to her baseline, denies nausea vomiting  Pneumonia, possibly septic pulmonary infarcts -Continue IV vancomycin.  Syphilis -Positive RPR with rash per ID, could be  secondary syphilis -Penicillin 2.4MU IM given  Hyponatremia -Stable,  Back pain -Unable to tolerate MRI, was discontinued, currently on IV antibiotics  Jaw pain,  tooth pain, dental caries -Placed on Augmentin, multiple caries, outpatient follow-up with [facility]    DVT prophylaxis: Lovenox Code Status: Full code. Family Communication: No family at bed side. Disposition Plan:  Status is: Inpatient  Remains inpatient appropriate because:Inpatient level of care appropriate due to severity of illness   Dispo: The patient is from: Home              Anticipated d/c is to: Home              Patient currently is not medically stable to d/c.   Difficult to place patient No   Consultants:   Cardiology, infectious diseases, cardiothoracic surgery  Procedures:   Antimicrobials:  Anti-infectives (From admission, onward)   Start     Dose/Rate Route Frequency Ordered Stop   07/22/20 1045  amoxicillin-clavulanate (AUGMENTIN) 875-125 MG per tablet 1 tablet        1 tablet Oral Every 12 hours 07/22/20 0952 07/31/20 2023   07/18/20 0830  vancomycin (VANCOCIN) IVPB 750 mg/150 ml premix        750 mg 150 mL/hr over 60 Minutes Intravenous Every 8 hours 07/18/20 0750     07/16/20 0400  Vancomycin (VANCOCIN) 1,250 mg in sodium chloride 0.9 % 250 mL IVPB  Status:  Discontinued        1,250 mg 166.7 mL/hr over 90 Minutes Intravenous Every 8 hours 07/15/20 1944 07/18/20 0749   07/15/20 1715  penicillin g benzathine (BICILLIN LA) 1200000 UNIT/2ML injection 2.4 Million Units        2.4 Million Units Intramuscular  Once 07/15/20 1621 07/15/20 1747   07/14/20 0300  vancomycin (VANCOCIN) IVPB 1000 mg/200 mL premix        1,000 mg 200 mL/hr over 60 Minutes Intravenous Every 8 hours 07/13/20 1806 07/15/20 2059   07/13/20 1730  Vancomycin (VANCOCIN) 1,500 mg in sodium chloride 0.9 % 500 mL IVPB        1,500 mg 250 mL/hr over 120 Minutes Intravenous  Once 07/13/20 1709 07/13/20 2116   07/13/20 1730  ceFEPIme (MAXIPIME) 2 g in sodium chloride 0.9 % 100 mL IVPB  Status:  Discontinued        2 g 200 mL/hr over 30 Minutes Intravenous Every 8 hours  07/13/20 1709 07/14/20 1050   07/13/20 1315  cefTRIAXone (ROCEPHIN) 1 g in sodium chloride 0.9 % 100 mL IVPB        1 g 200 mL/hr over 30 Minutes Intravenous  Once 07/13/20 1306 07/13/20 1538   07/13/20 1315  azithromycin (ZITHROMAX) 500 mg in sodium chloride 0.9 % 250 mL IVPB        500 mg 250 mL/hr over 60 Minutes Intravenous  Once 07/13/20 1306 07/13/20 1644      Subjective: Patient was seen and examined at bedside.  Overnight events noted.  Patient denies any shortness of breath and chest pain.  She is lying on her back, reports back pain  Objective: Vitals:   07/31/20 2022 08/01/20 0638 08/01/20 0708 08/01/20 0952  BP: 108/68 (!) 88/51 107/67 107/67  Pulse:  73 82 88  Resp: 18 18 18    Temp: 98 F (36.7 C) 97.7 F (36.5 C) 97.9 F (36.6 C)   TempSrc: Oral Oral Oral   SpO2: 95% 96% 98%   Weight:  81.9 kg  Height:        Intake/Output Summary (Last 24 hours) at 08/01/2020 1816 Last data filed at 08/01/2020 0945 Gross per 24 hour  Intake 630 ml  Output --  Net 630 ml   Filed Weights   07/30/20 0627 07/31/20 0500 08/01/20 2119  Weight: 80.7 kg 84.5 kg 81.9 kg    Examination:  General exam: Appears calm and comfortable, not in any acute distress Respiratory system: Clear to auscultation. Respiratory effort normal. Cardiovascular system: S1 & S2 heard, RRR. No JVD, murmurs, rubs, gallops or clicks. No pedal edema. Gastrointestinal system: Abdomen is nondistended, soft and nontender. No organomegaly or masses felt. Normal bowel sounds heard. Central nervous system: Alert and oriented. No focal neurological deficits. Extremities: Symmetric 5 x 5 power. Skin: No rashes, lesions or ulcers Psychiatry: Judgement and insight appear normal. Mood & affect appropriate.     Data Reviewed: I have personally reviewed following labs and imaging studies  CBC: Recent Labs  Lab 07/26/20 0557 07/27/20 0356 07/29/20 0500 07/30/20 0500 07/31/20 0213  WBC 11.3* 10.8* 12.1*  9.6 9.6  HGB 9.6* 9.3* 9.1* 9.0* 8.9*  HCT 28.9* 27.9* 27.8* 27.4* 26.3*  MCV 90.0 89.7 89.4 89.3 88.9  PLT 464* 470* 457* 413* 417*   Basic Metabolic Panel: Recent Labs  Lab 07/26/20 0557 07/27/20 0356 07/28/20 0626 07/29/20 0500 07/30/20 0500 07/31/20 0213  NA 132* 134*  --  130* 132* 130*  K 4.3 4.2  --  4.0 3.9 4.1  CL 96* 98  --  95* 97* 97*  CO2 29 28  --  27 27 27   GLUCOSE 88 105* 86 85 77 93  BUN 12 16  --  17 14 17   CREATININE 0.51 0.46  --  0.52 0.51 0.70  CALCIUM 9.4 9.4  --  9.2 9.1 9.2  MG 1.8  --   --   --   --   --    GFR: Estimated Creatinine Clearance: 109.1 mL/min (by C-G formula based on SCr of 0.7 mg/dL). Liver Function Tests: No results for input(s): AST, ALT, ALKPHOS, BILITOT, PROT, ALBUMIN in the last 168 hours. No results for input(s): LIPASE, AMYLASE in the last 168 hours. No results for input(s): AMMONIA in the last 168 hours. Coagulation Profile: No results for input(s): INR, PROTIME in the last 168 hours. Cardiac Enzymes: No results for input(s): CKTOTAL, CKMB, CKMBINDEX, TROPONINI in the last 168 hours. BNP (last 3 results) No results for input(s): PROBNP in the last 8760 hours. HbA1C: No results for input(s): HGBA1C in the last 72 hours. CBG: Recent Labs  Lab 07/30/20 2016 07/31/20 0610 07/31/20 2110 08/01/20 1148 08/01/20 1510  GLUCAP 79 85 100* 129* 101*   Lipid Profile: No results for input(s): CHOL, HDL, LDLCALC, TRIG, CHOLHDL, LDLDIRECT in the last 72 hours. Thyroid Function Tests: No results for input(s): TSH, T4TOTAL, FREET4, T3FREE, THYROIDAB in the last 72 hours. Anemia Panel: No results for input(s): VITAMINB12, FOLATE, FERRITIN, TIBC, IRON, RETICCTPCT in the last 72 hours. Sepsis Labs: No results for input(s): PROCALCITON, LATICACIDVEN in the last 168 hours.  No results found for this or any previous visit (from the past 240 hour(s)).   Radiology Studies: No results found.  Scheduled Meds: . sodium chloride    Intravenous Once  . acetaminophen  650 mg Oral Q6H  . Chlorhexidine Gluconate Cloth  6 each Topical Daily  . enoxaparin (LOVENOX) injection  40 mg Subcutaneous Q24H  . feeding supplement  237 mL Oral BID BM  .  furosemide  40 mg Oral Daily  . methadone  100 mg Oral Daily  . polyethylene glycol  17 g Oral BID  . prenatal vitamin w/FE, FA  1 tablet Oral Q1200  . senna-docusate  1 tablet Oral BID  . sodium chloride flush  10-40 mL Intracatheter Q12H   Continuous Infusions: . vancomycin 750 mg (08/01/20 1740)     LOS: 19 days    Time spent: 35 mins    Kelly Tavenner, MD [facility]   If 7PM-7AM, please contact night-coverage

## 2020-08-01 NOTE — Progress Notes (Signed)
Pharmacy Antibiotic Note  Kelly [name] is a 35 y.o. female with MRSA bacteremia  And TV endocarditis. 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  Pharmacy has been consulted for vancomycin. Patient is pregnant with EGA of [redacted] weeks.  Last levels came back at goal AUC range. Scr yesterday came back at 0.7 (CrCl >100 mL/min). Remains afebrile.    Planning 6 weeks of inpatient IV antibiotics.  Plan: Continue vancomycin to 750 mg IV q 8 hrs Monitor C&S, clinical status, renal function, VT as needed >> will order level later this week  Temp (24hrs), Avg:97.9 F (36.6 C), Min:97.7 F (36.5 C), Max:98 F (36.7 C)  Recent Labs  Lab 07/26/20 0557 07/27/20 0356 07/29/20 0500 07/29/20 1830 07/30/20 0500 07/30/20 0730 07/31/20 0213  WBC 11.3* 10.8* 12.1*  --  9.6  --  9.6  CREATININE 0.51 0.46 0.52  --  0.51  --  0.70  VANCOTROUGH  --   --   --   --   --  13*  --   VANCOPEAK  --   --   --  28*  --   --   --     Estimated Creatinine Clearance: 109.1 mL/min (by C-G formula based on SCr of 0.7 mg/dL).    Allergies  Allergen Reactions  . Hydrocodone Itching  . Morphine And Related Nausea And Vomiting    Antimicrobials this admission: Azith 3/2 x1 CTX 3/2 x1 Vancomycin 3/2> Cefepime 3/2> 3/3  Cultures: 3/2 urine - ng 3/2 Bld - GPC - MRSA 3/2 MRSA PCR pos 3/4 BCx x 2 > Neg  Thank you for allowing pharmacy to be a part of this patient's care.  Antonietta Jewel, PharmD, [facility] Clinical Pharmacist  Phone: [phone] 08/01/2020 10:56 AM  Please check AMION for all [place] phone numbers After 10:00 PM, call [place] [phone]

## 2020-08-01 NOTE — Progress Notes (Signed)
Patient ID: Kelly [name], female   DOB: 1985-07-07, 35 y.o.   MRN: [id number] FACULTY PRACTICE ANTEPARTUM PROGRESS NOTE  Kelly [name] is a 35 y.o. [phone] at 28w3dwho is admitted for MRSA bacteremia, infective endocarditis, severe tricuspid regurgitation, septic emboli with h/o discitis/osteomyelitis/epidural abscess, syphilis, chronic HTN, and acute on chronic right side heart failure in the setting of longstanding h/o IV heroin and fentanyl abuse.Estimated Date of Delivery: 10/18/20 Fetal presentation iscephalicper 31/47/82ultrasound   Length of Stay:  19 Days. Admitted 07/13/2020  Subjective: Patient reports normal fetal movement.  She denies uterine contractions, denies bleeding and leaking of fluid per vagina. She is without any complaints other than persistent back pain.  Vitals:  Blood pressure 107/67, pulse 88, temperature 97.9 F (36.6 C), temperature source Oral, resp. rate 18, height 5' 7.5" (1.715 m), weight 81.9 kg, SpO2 98 %, not currently breastfeeding. Physical Examination: GENERAL: Well-developed, well-nourished female in no acute distress.  LUNGS: Clear to auscultation bilaterally.  HEART: Regular rate and rhythm. ABDOMEN: Soft, nontender, gravid PELVIC: Not indicated EXTREMITIES: No cyanosis, clubbing, or edema, 2+ distal pulses.   Fetal monitoring: FHR: 130 bpm, Variability: moderate, Accelerations: Present, Decelerations: Absent  Uterine activity: no contractions per hour  Results for orders placed or performed during the hospital encounter of 07/13/20 (from the past 48 hour(s))  Glucose, capillary     Status: None   Collection Time: 07/30/20  8:16 PM  Result Value Ref Range   Glucose-Capillary 79 70 - 99 mg/dL    Comment: Glucose reference range applies only to samples taken after fasting for at least 8 hours.  CBC     Status: Abnormal   Collection Time: 07/31/20  2:13 AM  Result Value Ref Range   WBC 9.6 4.0 - 10.5 K/uL   RBC 2.96 (L) 3.87 - 5.11  MIL/uL   Hemoglobin 8.9 (L) 12.0 - 15.0 g/dL   HCT 26.3 (L) 36.0 - 46.0 %   MCV 88.9 80.0 - 100.0 fL   MCH 30.1 26.0 - 34.0 pg   MCHC 33.8 30.0 - 36.0 g/dL   RDW 13.7 11.5 - 15.5 %   Platelets 406 (H) 150 - 400 K/uL   nRBC 0.0 0.0 - 0.2 %    Comment: [facility]formed at [id number], [place] [id number] Basic metabolic panel     Status: Abnormal   Collection Time: 07/31/20  2:13 AM  Result Value Ref Range   Sodium 130 (L) 135 - 145 mmol/L   Potassium 4.1 3.5 - 5.1 mmol/L   Chloride 97 (L) 98 - 111 mmol/L   CO2 27 22 - 32 mmol/L   Glucose, Bld 93 70 - 99 mg/dL    Comment: Glucose reference range applies only to samples taken after fasting for at least 8 hours.   BUN 17 6 - 20 mg/dL   Creatinine, Ser 0.70 0.44 - 1.00 mg/dL   Calcium 9.2 8.9 - 10.3 mg/dL   GFR, Estimated >60 >60 mL/min    Comment: (NOTE) Calculated using the CKD-EPI Creatinine Equation (2021)    Anion gap 6 5 - 15    Comment: Performed at [facility], [place]  [id number] Glucose, capillary     Status: None   Collection Time: 07/31/20  6:10 AM  Result Value Ref Range   Glucose-Capillary 85 70 - 99 mg/dL    Comment: Glucose reference range applies only to samples taken after fasting  for at least 8 hours.  Glucose, capillary     Status: Abnormal   Collection Time: 07/31/20  9:10 PM  Result Value Ref Range   Glucose-Capillary 100 (H) 70 - 99 mg/dL    Comment: Glucose reference range applies only to samples taken after fasting for at least 8 hours.    I have reviewed the patient's current medications.  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)   Tricuspid  regurgitation   Supervision of other high risk pregnancies, third trimester   Preexisting hypertension complicating pregnancy, antepartum   Dental caries   Endocarditis of tricuspid valve   History of VBAC   Lumbar back pain   Unwanted fertility   Acute on chronic congestive heart failure (HCC)   Cellulitis of right lower extremity   Septic embolism (HCC)   Syphilis   [redacted] weeks gestation of pregnancy   Flank pain   Drug use affecting pregnancy in second trimester   Methadone maintenance treatment affecting pregnancy ([place])   Maternal endocarditis complicating pregnancy in third trimester   Pain, dental   PLAN: - pt did not tolerate her glucola drink, she is agreeable to doing CBGs 4x daily for a week to assess DM status- CBGs thus far within normal range - cont methadone 100 mg daily, seems to be a good dose for her currently - Management per IM, ID and Cardiology teams as current hospitalization is not related to pregnancy - Continue antibiotic therapies as per ID - Appreciate the multidisciplinary care for this complex patient. - Patient now willing to have lumbar MRI. This would be beneficial information for anesthesiologist to have as patient desires to have an epidural at time of delivery  - From an OB standpoint, fetal well being reassuring. Reassuring FHT - Weekly BPP   -For TOLAC and BTL - We will continue to follow patient closely. Daily FHR assessment will be done by our Rapid Response OB Nurse as directed, and weekly ultrasounds by our MFM team.    Please call [phone] [facility] Consult Attending Monday-Friday 8am - 5pm) or [phone] [facility] Attending On Call all day, every day) for any obstetric concerns at any time.   Total consultation time including face-to-face time with patient (>50% of time), reviewing chart and documentation: 25 minutes   Continue routine antenatal care.   Mora Bellman, MD, [place] [facility] [employer]  (Faculty Practice)  08/01/2020 11:08 AM

## 2020-08-02 ENCOUNTER — Inpatient Hospital Stay (HOSPITAL_BASED_OUTPATIENT_CLINIC_OR_DEPARTMENT_OTHER): Payer: Medicaid Other

## 2020-08-02 DIAGNOSIS — O34219 Maternal care for unspecified type scar from previous cesarean delivery: Secondary | ICD-10-CM

## 2020-08-02 DIAGNOSIS — R7881 Bacteremia: Secondary | ICD-10-CM | POA: Diagnosis not present

## 2020-08-02 DIAGNOSIS — O10013 Pre-existing essential hypertension complicating pregnancy, third trimester: Secondary | ICD-10-CM | POA: Diagnosis not present

## 2020-08-02 DIAGNOSIS — O99323 Drug use complicating pregnancy, third trimester: Secondary | ICD-10-CM

## 2020-08-02 DIAGNOSIS — O99413 Diseases of the circulatory system complicating pregnancy, third trimester: Secondary | ICD-10-CM | POA: Diagnosis not present

## 2020-08-02 DIAGNOSIS — B9562 Methicillin resistant Staphylococcus aureus infection as the cause of diseases classified elsewhere: Secondary | ICD-10-CM | POA: Diagnosis not present

## 2020-08-02 DIAGNOSIS — Z3A29 29 weeks gestation of pregnancy: Secondary | ICD-10-CM

## 2020-08-02 DIAGNOSIS — O09523 Supervision of elderly multigravida, third trimester: Secondary | ICD-10-CM

## 2020-08-02 DIAGNOSIS — I119 Hypertensive heart disease without heart failure: Secondary | ICD-10-CM | POA: Diagnosis not present

## 2020-08-02 LAB — COMPREHENSIVE METABOLIC PANEL
ALT: 12 U/L (ref 0–44)
AST: 22 U/L (ref 15–41)
Albumin: 2.3 g/dL — ABNORMAL LOW (ref 3.5–5.0)
Alkaline Phosphatase: 96 U/L (ref 38–126)
Anion gap: 7 (ref 5–15)
BUN: 14 mg/dL (ref 6–20)
CO2: 28 mmol/L (ref 22–32)
Calcium: 9.4 mg/dL (ref 8.9–10.3)
Chloride: 97 mmol/L — ABNORMAL LOW (ref 98–111)
Creatinine, Ser: 0.52 mg/dL (ref 0.44–1.00)
GFR, Estimated: >60 mL/min (ref >60)
Glucose, Bld: 83 mg/dL (ref 70–99)
Potassium: 4 mmol/L (ref 3.5–5.1)
Sodium: 132 mmol/L — ABNORMAL LOW (ref 135–145)
Total Bilirubin: 0.5 mg/dL (ref 0.3–1.2)
Total Protein: 6.9 g/dL (ref 6.5–8.1)

## 2020-08-02 LAB — GLUCOSE, CAPILLARY
Glucose-Capillary: 79 mg/dL (ref 70–99)
Glucose-Capillary: 75 mg/dL (ref 70–99)
Glucose-Capillary: 108 mg/dL — ABNORMAL HIGH (ref 70–99)
Glucose-Capillary: 91 mg/dL (ref 70–99)
Glucose-Capillary: 131 mg/dL — ABNORMAL HIGH (ref 70–99)
Glucose-Capillary: 92 mg/dL (ref 70–99)

## 2020-08-02 LAB — PHOSPHORUS: Phosphorus: 4.4 mg/dL (ref 2.5–4.6)

## 2020-08-02 LAB — CBC
HCT: 25.5 % — ABNORMAL LOW (ref 36.0–46.0)
Hemoglobin: 8.5 g/dL — ABNORMAL LOW (ref 12.0–15.0)
MCH: 30.1 pg (ref 26.0–34.0)
MCHC: 33.3 g/dL (ref 30.0–36.0)
MCV: 90.4 fL (ref 80.0–100.0)
Platelets: 330 10*3/uL (ref 150–400)
RBC: 2.82 MIL/uL — ABNORMAL LOW (ref 3.87–5.11)
RDW: 14 % (ref 11.5–15.5)
WBC: 9.2 10*3/uL (ref 4.0–10.5)
nRBC: 0 % (ref 0.0–0.2)

## 2020-08-02 LAB — MAGNESIUM: Magnesium: 1.7 mg/dL (ref 1.7–2.4)

## 2020-08-02 NOTE — Progress Notes (Signed)
pts RN called [facility] to come evaluate patient due to pt complaining of pain in her lower abdomen.  [facility] arrived at pts bedside, pt placed on external monitor and pt reports pain began about 30 minutes ago and seems to be getting better now. NST completed. No UC seen. Pt denies any other complaints.

## 2020-08-02 NOTE — Progress Notes (Signed)
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 -Current active IV heroin user, uses about 3 g daily and IV fentanyl, presented to the ED with 3 to 4 weeks history of bilateral lower extremity edema associated with worsening dyspnea on exertion for 4 days, intermittent fever for several weeks and pain with deep inspiration across her right lateral chest and flank. -In the ED she was noted to have fluid overload, blood cultures were positive for MRSA and she was a started on IV vancomycin. -2D echo without obvious vegetation, TEE was deferred and plan is to treat empirically for endocarditis ( 6 weeks of vancomycin) -Patient was also found to be [redacted] weeks Pregnant.Currently 28 weeks pregnancy.  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)   Tricuspid regurgitation   Supervision of other high risk pregnancies, third trimester   Preexisting hypertension complicating pregnancy, antepartum   Dental caries   Endocarditis of tricuspid valve   History of VBAC   Lumbar back pain   Unwanted fertility   Acute on chronic congestive heart failure (HCC)   Cellulitis of right lower extremity   Septic embolism (HCC)   Syphilis   [redacted] weeks gestation of pregnancy   Flank pain   Drug use affecting pregnancy in second  trimester   Methadone maintenance treatment affecting pregnancy ([place])   Maternal endocarditis complicating pregnancy in third trimester   Pain, dental   28 weeks pregnancy -Management per primary service, OB  MRSA bacteremia with presumed endocarditis : -Per ID recommendations, continue IV vancomycin -2D echo without obvious vegetations, TEE deferred as empirically treating as endocarditis -Repeat blood cultures so far negative -On IV vancomycin x 6 weeks, afebrile, 07/31/2020: Complete course of antibiotics. She will remain in hospital to complete 6 weeks of antibiotics.  Acute on chronic right-sided heart failure with severe TR, severe pulmonary hypertension -2D echo showed decreased RV systolic function, elevated PA pressure 92, severe TR - She is being followed by cardiology/CHF team, transition to Lasix oral on 3/12  - Negative balance of 8.7 L, continue Lasix 40 mg PO daily  Anemia of chronic disease - Baseline hemoglobin 8.5-9.5 - B12 445, folate 7.4, iron 32, ferritin 111 -Continue prenatal vitamins, H&H stable, 8.9  History of IV heroin and fentanyl abuse, chronic pain syndrome -Patient now started on methadone 100 mg daily on 3/15, she has previously tolerated 100 mg of methadone daily -Dilaudid decreased to 0.5- 1 mg every 4 hours as needed for severe/breakthrough pain -HCV antibody+, hep A, hep B negative -Overnight 3/18 heart rate increased to 120s, patient reported that she had vomited her methadone during the day and was having withdrawals at night, received extra dose of methadone at 2200 -Currently no complaints, feels back to her baseline, denies nausea, vomiting  Pneumonia, possibly septic pulmonary infarcts -Continue IV vancomycin.  Syphilis -Positive RPR with rash per ID, could be  secondary syphilis -Penicillin 2.4MU IM given  Hyponatremia -Stable,  Back pain -Unable to tolerate MRI, was discontinued, currently on IV antibiotics  Jaw pain,  tooth pain, dental caries -Placed on Augmentin, multiple caries, outpatient follow-up [facility]    DVT prophylaxis: Lovenox Code Status: Full code. Family Communication: No family at bed side. Disposition Plan:  Status is: Inpatient  Remains inpatient appropriate because:Inpatient level of care appropriate due to severity of illness   Dispo: The patient is from: Home              Anticipated d/c is to: Home              Patient currently is not medically stable to d/c.   Difficult to place patient No   Consultants:   Cardiology, infectious diseases, cardiothoracic surgery  Procedures:   Antimicrobials:  Anti-infectives (From admission, onward)   Start     Dose/Rate Route Frequency Ordered Stop   07/22/20 1045  amoxicillin-clavulanate (AUGMENTIN) 875-125 MG per tablet 1 tablet        1 tablet Oral Every 12 hours 07/22/20 0952 07/31/20 2023   07/18/20 0830  vancomycin (VANCOCIN) IVPB 750 mg/150 ml premix        750 mg 150 mL/hr over 60 Minutes Intravenous Every 8 hours 07/18/20 0750     07/16/20 0400  Vancomycin (VANCOCIN) 1,250 mg in sodium chloride 0.9 % 250 mL IVPB  Status:  Discontinued        1,250 mg 166.7 mL/hr over 90 Minutes Intravenous Every 8 hours 07/15/20 1944 07/18/20 0749   07/15/20 1715  penicillin g benzathine (BICILLIN LA) 1200000 UNIT/2ML injection 2.4 Million Units        2.4 Million Units Intramuscular  Once 07/15/20 1621 07/15/20 1747   07/14/20 0300  vancomycin (VANCOCIN) IVPB 1000 mg/200 mL premix        1,000 mg 200 mL/hr over 60 Minutes Intravenous Every 8 hours 07/13/20 1806 07/15/20 2059   07/13/20 1730  Vancomycin (VANCOCIN) 1,500 mg in sodium chloride 0.9 % 500 mL IVPB        1,500 mg 250 mL/hr over 120 Minutes Intravenous  Once 07/13/20 1709 07/13/20 2116   07/13/20 1730  ceFEPIme (MAXIPIME) 2 g in sodium chloride 0.9 % 100 mL IVPB  Status:  Discontinued        2 g 200 mL/hr over 30 Minutes Intravenous Every 8 hours  07/13/20 1709 07/14/20 1050   07/13/20 1315  cefTRIAXone (ROCEPHIN) 1 g in sodium chloride 0.9 % 100 mL IVPB        1 g 200 mL/hr over 30 Minutes Intravenous  Once 07/13/20 1306 07/13/20 1538   07/13/20 1315  azithromycin (ZITHROMAX) 500 mg in sodium chloride 0.9 % 250 mL IVPB        500 mg 250 mL/hr over 60 Minutes Intravenous  Once 07/13/20 1306 07/13/20 1644      Subjective: Patient was seen and examined at bedside.  Overnight events noted.   Patient denies any shortness of breath and chest pain.  She is lying on her back, reports back pain is better. She reports fetal movements.  Objective: Vitals:   08/01/20 2014 08/01/20 2348 08/02/20 0451 08/02/20 1208  BP: 104/63 114/73 111/72 109/72  Pulse: 79 73 82 79  Resp: 18 18 18 18   Temp: 97.8 F (36.6 C) 98 F (36.7 C) 97.6 F (36.4 C) 98 F (36.7 C)  TempSrc: Oral Oral Oral Oral  SpO2: 96% 100%  97% 98%  Weight:   82.2 kg   Height:       No intake or output data in the 24 hours ending 08/02/20 1543 Filed Weights   07/31/20 0500 08/01/20 0638 08/02/20 0451  Weight: 84.5 kg 81.9 kg 82.2 kg    Examination:  General exam: Appears calm and comfortable, not in any acute distress Respiratory system: Clear to auscultation. Respiratory effort normal. Cardiovascular system: S1 & S2 heard, RRR. No JVD, murmurs, rubs, gallops or clicks. No pedal edema. Gastrointestinal system: Abdomen is nondistended, soft and nontender. No organomegaly or masses felt. Normal bowel sounds heard. Central nervous system: Alert and oriented. No focal neurological deficits. Extremities: Symmetric 5 x 5 power. Skin: No rashes, lesions or ulcers Psychiatry: Judgement and insight appear normal. Mood & affect appropriate.     Data Reviewed: I have personally reviewed following labs and imaging studies  CBC: Recent Labs  Lab 07/27/20 0356 07/29/20 0500 07/30/20 0500 07/31/20 0213 08/02/20 0446  WBC 10.8* 12.1* 9.6 9.6 9.2  HGB 9.3* 9.1* 9.0*  8.9* 8.5*  HCT 27.9* 27.8* 27.4* 26.3* 25.5*  MCV 89.7 89.4 89.3 88.9 90.4  PLT 470* 457* 413* 406* 103   Basic Metabolic Panel: Recent Labs  Lab 07/27/20 0356 07/28/20 0626 07/29/20 0500 07/30/20 0500 07/31/20 0213 08/02/20 0446  NA 134*  --  130* 132* 130* 132*  K 4.2  --  4.0 3.9 4.1 4.0  CL 98  --  95* 97* 97* 97*  CO2 28  --  27 27 27 28   GLUCOSE 105* 86 85 77 93 83  BUN 16  --  17 14 17 14   CREATININE 0.46  --  0.52 0.51 0.70 0.52  CALCIUM 9.4  --  9.2 9.1 9.2 9.4  MG  --   --   --   --   --  1.7  PHOS  --   --   --   --   --  4.4   GFR: Estimated Creatinine Clearance: 109.4 mL/min (by C-G formula based on SCr of 0.52 mg/dL). Liver Function Tests: Recent Labs  Lab 08/02/20 0446  AST 22  ALT 12  ALKPHOS 96  BILITOT 0.5  PROT 6.9  ALBUMIN 2.3*   No results for input(s): LIPASE, AMYLASE in the last 168 hours. No results for input(s): AMMONIA in the last 168 hours. Coagulation Profile: No results for input(s): INR, PROTIME in the last 168 hours. Cardiac Enzymes: No results for input(s): CKTOTAL, CKMB, CKMBINDEX, TROPONINI in the last 168 hours. BNP (last 3 results) No results for input(s): PROBNP in the last 8760 hours. HbA1C: No results for input(s): HGBA1C in the last 72 hours. CBG: Recent Labs  Lab 08/01/20 2017 08/02/20 0615 08/02/20 0810 08/02/20 0943 08/02/20 1145  GLUCAP 111* 79 75 108* 91   Lipid Profile: No results for input(s): CHOL, HDL, LDLCALC, TRIG, CHOLHDL, LDLDIRECT in the last 72 hours. Thyroid Function Tests: No results for input(s): TSH, T4TOTAL, FREET4, T3FREE, THYROIDAB in the last 72 hours. Anemia Panel: No results for input(s): VITAMINB12, FOLATE, FERRITIN, TIBC, IRON, RETICCTPCT in the last 72 hours. Sepsis Labs: No results for input(s): PROCALCITON, LATICACIDVEN in the last 168 hours.  No results found for this or any previous visit (from the past 240 hour(s)).   Radiology Studies: [place] MFM OB LIMITED  Result Date:  08/02/2020 ----------------------------------------------------------------------  OBSTETRICS REPORT                       (  Signed Final 08/02/2020 01:36 pm) ---------------------------------------------------------------------- Patient Info  ID #:       [id number]                          D.O.B.:  07/08/1985 (34 yrs)  Name:       Kelly [name]              Visit Date: 08/02/2020 11:49 am ---------------------------------------------------------------------- Performed By  Attending:        Sander Nephew      Ref. Address:     [place]                    MD                                                             [address]  Performed By:     Hubert Azure          [facility]                    [place]  Referred By:      Sloan Leiter                    MD ---------------------------------------------------------------------- Orders  #  Description                           Code        Ordered By  1  [place] MFM OB LIMITED                     76815.01    PEGGY CONSTANT ----------------------------------------------------------------------  #  Order #                     Accession #                Episode #  1  [id number]                   [id number]                 [id number] ---------------------------------------------------------------------- Indications  [redacted] weeks gestation of pregnancy                Z3A.29  Heart disease in mother affecting pregnancy    O99.413  in third trimester  Hypertension - Chronic/Pre-existing            I45.809  Drug use complicating pregnancy, third         O13.323  trimester  Advanced maternal age multigravida 78+,        O57.523  third trimester  Previous cesarean delivery, antepartum x1      O34.219 ---------------------------------------------------------------------- Fetal Evaluation  Num Of Fetuses:         1  Fetal Heart Rate(bpm):  132  Cardiac Activity:       Observed  Presentation:  Transverse,  head to maternal left  Placenta:               Anterior  P. Cord Insertion:      Visualized, central  Amniotic Fluid  AFI FV:      Within normal limits  AFI Sum(cm)     %Tile       Largest Pocket(cm)  11.59           25          4.19  RUQ(cm)       RLQ(cm)       LUQ(cm)        LLQ(cm)  4.19          3.07          1.83           2.5  Comment:    Stomach and bladder noted. ---------------------------------------------------------------------- OB History  Gravidity:    7         Term:   3        Prem:   0        SAB:   3  TOP:          0       Ectopic:  0        Living: 3 ---------------------------------------------------------------------- Gestational Age  Best:          29w 0d     Det. By:  U/S  (07/13/20)          EDD:   10/18/20 ---------------------------------------------------------------------- Impression  Limited exam to assess amniotic fluid volume.  Good fetal movement and amniotic fluid.  Transverse presentation. ---------------------------------------------------------------------- Recommendations  Management per inpatient provider. ----------------------------------------------------------------------               Sander Nephew, MD Electronically Signed Final Report   08/02/2020 01:36 pm ----------------------------------------------------------------------   Scheduled Meds: . sodium chloride   Intravenous Once  . acetaminophen  650 mg Oral Q6H  . Chlorhexidine Gluconate Cloth  6 each Topical Daily  . enoxaparin (LOVENOX) injection  40 mg Subcutaneous Q24H  . feeding supplement  237 mL Oral BID BM  . furosemide  40 mg Oral Daily  . methadone  100 mg Oral Daily  . polyethylene glycol  17 g Oral BID  . prenatal vitamin w/FE, FA  1 tablet Oral Q1200  . senna-docusate  1 tablet Oral BID  . sodium chloride flush  10-40 mL Intracatheter Q12H   Continuous Infusions: . vancomycin 750 mg (08/02/20 0836)     LOS: 20 days    Time spent: 25 mins    Taite Schoeppner, MD [facility]   If 7PM-7AM, please contact night-coverage

## 2020-08-02 NOTE — Progress Notes (Signed)
NST reassuring and reactive for [redacted] weeks gestation.

## 2020-08-02 NOTE — Progress Notes (Signed)
Pt on 5C20.  Monitors applied for NST.  Pt in good spirits.  No complaints of bleeding, leaking or cxns.

## 2020-08-03 ENCOUNTER — Inpatient Hospital Stay (HOSPITAL_COMMUNITY): Payer: Medicaid Other

## 2020-08-03 DIAGNOSIS — I361 Nonrheumatic tricuspid (valve) insufficiency: Secondary | ICD-10-CM

## 2020-08-03 DIAGNOSIS — B9562 Methicillin resistant Staphylococcus aureus infection as the cause of diseases classified elsewhere: Secondary | ICD-10-CM | POA: Diagnosis not present

## 2020-08-03 DIAGNOSIS — R7881 Bacteremia: Secondary | ICD-10-CM | POA: Diagnosis not present

## 2020-08-03 LAB — ECHOCARDIOGRAM LIMITED
Calc EF: 54.2 %
Height: 67.5 in
S' Lateral: 3.1 cm
Single Plane A2C EF: 61.5 %
Single Plane A4C EF: 51.2 %
Weight: 2907.2 oz

## 2020-08-03 LAB — HEMOGLOBIN AND HEMATOCRIT, BLOOD
HCT: 26.1 % — ABNORMAL LOW (ref 36.0–46.0)
HCT: 27.7 % — ABNORMAL LOW (ref 36.0–46.0)
Hemoglobin: 8.3 g/dL — ABNORMAL LOW (ref 12.0–15.0)
Hemoglobin: 8.9 g/dL — ABNORMAL LOW (ref 12.0–15.0)

## 2020-08-03 LAB — GLUCOSE, CAPILLARY
Glucose-Capillary: 82 mg/dL (ref 70–99)
Glucose-Capillary: 125 mg/dL — ABNORMAL HIGH (ref 70–99)
Glucose-Capillary: 100 mg/dL — ABNORMAL HIGH (ref 70–99)
Glucose-Capillary: 93 mg/dL (ref 70–99)

## 2020-08-03 LAB — CREATININE, SERUM
Creatinine, Ser: 0.52 mg/dL (ref 0.44–1.00)
GFR, Estimated: >60 mL/min (ref >60)

## 2020-08-03 NOTE — TOC Initial Note (Addendum)
Transition of Care (TOC) - Initial/Assessment Note  Heart Failure   Patient Details  Name: Kelly [name] MRN: [id number] Date of Birth: [birth year]  Transition of Care [name]) CM/SW Contact:    [place], [place] Phone Number: 08/03/2020, 1:14 PM  Clinical Narrative: CSW completed SDOH with Kelly [name] she was awake but very lethargic including speaking with her eyes closed. Kelly [name] reports that she has nothing and has been experiencing homelessness with prostitution and drug use however states that she is ready for change and doesn't want to go back to using substances. Kelly [name] expressed interest in a long-term substance use program longer than just 30 days. Kelly [name] was open to exploring her options including doing a phone screen with [facility] which a 54-monthresidential program. CSW provided Kelly [name] a lBuilding surveyorof resources for treatment and housing. [name] that she was concerned about their maintenance program as she is allergic to Suboxone/Subtex and needs methadone. [facility] prefer to use [facility] but are willing to work with patient needs. Kelly [name] needing a PCP as well.  TOC/CSW will continue to follow Kelly [name] through discharge.                  Barriers to Discharge: Continued Medical Work up,Homeless with medical needs,Unsafe home situation,Financial Resources,Active Substance Use - Placement   Patient Goals and CMS Choice Patient states their goals for this hospitalization and ongoing recovery are:: Patient reports wanting sobriety and doesn't want to return to using subtances motivation for change is at the beginning stages      Expected Discharge Plan and Services   In-house Referral: Clinical Social Work Discharge Planning Services: CM Consult                                          Prior Living Arrangements/Services   Lives with:: Other (Comment) (experiencing  homelessness) Patient language and need for interpreter reviewed:: Yes Do you feel safe going back to the place where you live?: No   Patient is experiencing homeslessness and substance use and reports a desire for change  Need for Family Participation in Patient Care: No (Comment) Care giver support system in place?: No (comment)   Criminal Activity/Legal Involvement Pertinent to Current Situation/Hospitalization: No - Comment as needed  Activities of Daily Living Home Assistive Devices/Equipment: None ADL Screening (condition at time of admission) Patient's cognitive ability adequate to safely complete daily activities?: Yes Is the patient deaf or have difficulty hearing?: No Does the patient have difficulty seeing, even when wearing glasses/contacts?: No Does the patient have difficulty concentrating, remembering, or making decisions?: No Patient able to express need for assistance with ADLs?: Yes Does the patient have difficulty dressing or bathing?: Yes Independently performs ADLs?: No Communication: Independent Dressing (OT): Needs assistance Is this a change from baseline?: Change from baseline, expected to last >3 days Grooming: Independent Feeding: Independent Bathing: Needs assistance Is this a change from baseline?: Change from baseline, expected to last >3 days Toileting: Needs assistance Is this a change from baseline?: Change from baseline, expected to last >3days In/Out Bed: Needs assistance Is this a change from baseline?: Change from baseline, expected to last >3 days Walks in Home: Needs assistance Is this a change from baseline?: Change from baseline, expected to last >3 days Does the patient have difficulty walking or  climbing stairs?: Yes Weakness of Legs: Both Weakness of Arms/Hands: None  Permission Sought/Granted Permission sought to share information with : Case Manager                Emotional Assessment Appearance:: Appears stated  age,Disheveled Attitude/Demeanor/Rapport: Engaged,Lethargic Affect (typically observed): Pleasant Orientation: : Oriented to Self,Oriented to Place,Oriented to  Time,Oriented to Situation Alcohol / Substance Use: Illicit Drugs Psych Involvement: No (comment)  Admission diagnosis:  Acute cystitis without hematuria [N30.00] Nonrheumatic tricuspid valve regurgitation [I36.1] Right upper quadrant abdominal pain [R10.11] [redacted] weeks gestation of pregnancy [Z3A.25] Sepsis ([place]) [A41.9] No prenatal care in current pregnancy in second trimester [O09.32] Limited prenatal care, antepartum [O09.30] Community acquired pneumonia, unspecified laterality [J18.9] Sepsis without acute organ dysfunction, due to unspecified organism [facility]) [A41.9] Patient Active Problem List   Diagnosis Date Noted  . Pain, dental   . Maternal endocarditis complicating pregnancy in third trimester   . Methadone maintenance treatment affecting pregnancy ([place]) 07/21/2020  . Drug use affecting pregnancy in second trimester   . [redacted] weeks gestation of pregnancy   . Flank pain   . Septic embolism ([place])   . Syphilis   . Cellulitis of right lower extremity   . MRSA bacteremia 07/13/2020  . Acute on chronic congestive heart failure ([place]) 07/13/2020  . Acute cystitis without hematuria   . 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 Oth psychoactive substance abuse w mood disorder ([place]) 06/26/2019  . Unwanted fertility 09/04/2018  . Chronic systolic heart failure ([place]) 08/29/2018  . Lumbar back pain   . Hematoma   . PICC (peripherally inserted central catheter) flush   . Infection due to Streptococcus mitis group 07/20/2018  . History of VBAC 07/16/2018  . Endocarditis of tricuspid valve 07/12/2018  . Dental caries 07/03/2018  . 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 Preexisting hypertension complicating pregnancy, antepartum 06/29/2018  . Supervision of other high risk  pregnancies, third trimester 06/27/2018  . Rubella non-immune status, antepartum 06/23/2018  . 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 Tricuspid regurgitation 06/14/2018  . Difficult intravenous access   . IVDU (intravenous drug user)   . Bipolar disorder ([place]) 07/31/2016  . Major depressive disorder, recurrent episode with anxious distress ([place]) 07/29/2016  . Polysubstance abuse ([place]) 05/25/2016  . No prenatal care in current pregnancy in third trimester 05/25/2016  . Chronic hypertension affecting pregnancy 05/25/2016  . History of cesarean delivery affecting pregnancy 05/25/2016  . Community acquired pneumonia 05/25/2016  . Abscess of right breast 07/13/2014  . Opioid use disorder, severe, dependence ([place]) 08/25/2013  . Unspecified episodic mood disorder 08/25/2013  . ADHD (attention deficit hyperactivity disorder) 08/25/2013  . Cocaine abuse ([place]) 08/25/2013  . Thoracic back pain 01/29/2013  . CROHN'S DISEASE 05/03/2005  . COLONIC POLYPS 02/28/2003   PCP:  [facility] Pharmacy:   [facility] [place], [address] [place] [address] Phone: [phone] Fax: [phone]     Social Determinants of Health (SDOH) Interventions Food Insecurity Interventions: Other (Comment) (Pt. reports having food stamps) Financial Strain Interventions: Intervention Not Indicated,Other (Comment) (CSW working on connecting Pt. to resources) Housing Interventions: Other (Comment) (Referral to outpatient residential treatment program for pregnant women) Transportation Interventions: [facility] Alcohol Brief Interventions/Follow-up: Continued Monitoring (Pt. reports that she hasn't  had a drink since her pregnancy) Depression Interventions/Treatment : Counseling,Medication  (Pt. reports she has been on medication before and needs to get back on medication and she is open to counseling and meds.)  Readmission Risk Interventions Readmission Risk Prevention Plan 07/30/2018  Transportation Screening Complete  Medication Review Press photographer) Complete  PCP or Specialist appointment within 3-5 days of discharge Complete  HRI or [place] Not Complete  HRI or Home Care Consult Pt Refusal Comments N/A  SW Recovery Care/Counseling Consult Complete  Palliative Care Screening Not Complete  Comments N/A  [place] Not Complete  SNF Comments N/A  Some recent data might be hidden   Eon Zunker, MSW, LCSWA [phone] Heart Failure Social Worker

## 2020-08-03 NOTE — Plan of Care (Signed)

## 2020-08-03 NOTE — Progress Notes (Signed)
  Echocardiogram 2D Echocardiogram has been performed.  Kelly [name] 08/03/2020, 3:03 PM

## 2020-08-03 NOTE — Progress Notes (Signed)
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 -Current active IV heroin user, uses about 3 g daily and IV fentanyl, presented to the ED with 3 to 4 weeks history of bilateral lower extremity edema associated with worsening dyspnea on exertion for 4 days, intermittent fever for several weeks and pain with deep inspiration across her right lateral chest and flank. -In the ED she was noted to have fluid overload, blood cultures were positive for MRSA and she was a started on IV vancomycin. -2D echo without obvious vegetation, TEE was deferred and plan is to treat empirically for endocarditis ( 6 weeks of vancomycin) -Patient was also found to be [redacted] weeks Pregnant.Currently 29 weeks pregnancy.She will remain inpatient to complete 6 weeks of IV antibiotics.  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)   Tricuspid regurgitation   Supervision of other high risk pregnancies, third trimester   Preexisting hypertension complicating pregnancy, antepartum   Dental caries   Endocarditis of tricuspid valve   History of VBAC   Lumbar back pain   Unwanted fertility   Acute on chronic congestive heart failure (HCC)   Cellulitis of right lower extremity   Septic embolism (HCC)   Syphilis   [redacted] weeks gestation of  pregnancy   Flank pain   Drug use affecting pregnancy in second trimester   Methadone maintenance treatment affecting pregnancy ([place])   Maternal endocarditis complicating pregnancy in third trimester   Pain, dental   28 weeks pregnancy : -Management per primary service, OB  MRSA bacteremia with presumed endocarditis : -Per ID recommendations, continue IV vancomycin -2D echo without obvious vegetations, TEE deferred as empirically treating as endocarditis -Repeat blood cultures so far negative -On IV vancomycin x 6 weeks, afebrile, 07/31/2020: Complete course of antibiotics. She will remain in hospital to complete 6 weeks of IV antibiotics.  Acute on chronic right-sided heart failure with severe TR, severe pulmonary hypertension -2D echo showed decreased RV systolic function, elevated PA pressure 92, severe TR - She is being followed by cardiology/CHF team, transition to Lasix oral on 3/12  - Negative balance of 8.7 L, continue Lasix 40 mg PO daily  Anemia of chronic disease - Baseline hemoglobin 8.5-9.5 - B12 445, folate 7.4, iron 32, ferritin 111 -Continue prenatal vitamins, H&H stable, 8.9  History of IV heroin and fentanyl abuse, chronic pain syndrome -Patient now started on methadone 100 mg daily on 3/15, she has previously tolerated 100 mg of methadone daily -Dilaudid decreased to 0.5- 1 mg every 4 hours as needed for severe/breakthrough pain -HCV antibody+, hep A, hep B negative -Overnight 3/18 heart rate increased to 120s, patient reported that she had vomited her methadone during the day and was having withdrawals at night, received extra dose of methadone at 2200 -Currently no complaints, feels back to her baseline, denies nausea, vomiting  Pneumonia, possibly septic pulmonary infarcts -Continue  IV vancomycin.  Syphilis -Positive RPR with rash per ID, could be secondary syphilis -Penicillin 2.4MU IM given  Hyponatremia -Stable,  Back pain -Unable to  tolerate MRI, was discontinued, currently on IV antibiotics  Jaw pain, tooth pain, dental caries -Placed on Augmentin, multiple caries, outpatient follow-up with [facility]    DVT prophylaxis: Lovenox Code Status: Full code. Family Communication: No family at bed side. Disposition Plan:  Status is: Inpatient  Remains inpatient appropriate because:Inpatient level of care appropriate due to severity of illness   Dispo: The patient is from: Home              Anticipated d/c is to: Home              Patient currently is not medically stable to d/c.   Difficult to place patient No   Consultants:   Cardiology, infectious diseases, cardiothoracic surgery  Procedures:   Antimicrobials:  Anti-infectives (From admission, onward)   Start     Dose/Rate Route Frequency Ordered Stop   07/22/20 1045  amoxicillin-clavulanate (AUGMENTIN) 875-125 MG per tablet 1 tablet        1 tablet Oral Every 12 hours 07/22/20 0952 07/31/20 2023   07/18/20 0830  vancomycin (VANCOCIN) IVPB 750 mg/150 ml premix        750 mg 150 mL/hr over 60 Minutes Intravenous Every 8 hours 07/18/20 0750     07/16/20 0400  Vancomycin (VANCOCIN) 1,250 mg in sodium chloride 0.9 % 250 mL IVPB  Status:  Discontinued        1,250 mg 166.7 mL/hr over 90 Minutes Intravenous Every 8 hours 07/15/20 1944 07/18/20 0749   07/15/20 1715  penicillin g benzathine (BICILLIN LA) 1200000 UNIT/2ML injection 2.4 Million Units        2.4 Million Units Intramuscular  Once 07/15/20 1621 07/15/20 1747   07/14/20 0300  vancomycin (VANCOCIN) IVPB 1000 mg/200 mL premix        1,000 mg 200 mL/hr over 60 Minutes Intravenous Every 8 hours 07/13/20 1806 07/15/20 2059   07/13/20 1730  Vancomycin (VANCOCIN) 1,500 mg in sodium chloride 0.9 % 500 mL IVPB        1,500 mg 250 mL/hr over 120 Minutes Intravenous  Once 07/13/20 1709 07/13/20 2116   07/13/20 1730  ceFEPIme (MAXIPIME) 2 g in sodium chloride 0.9 % 100 mL IVPB  Status:   Discontinued        2 g 200 mL/hr over 30 Minutes Intravenous Every 8 hours 07/13/20 1709 07/14/20 1050   07/13/20 1315  cefTRIAXone (ROCEPHIN) 1 g in sodium chloride 0.9 % 100 mL IVPB        1 g 200 mL/hr over 30 Minutes Intravenous  Once 07/13/20 1306 07/13/20 1538   07/13/20 1315  azithromycin (ZITHROMAX) 500 mg in sodium chloride 0.9 % 250 mL IVPB        500 mg 250 mL/hr over 60 Minutes Intravenous  Once 07/13/20 1306 07/13/20 1644      Subjective: Patient was seen and examined at bedside.  Overnight events noted.  She is lying comfortably in the bed. Patient denies any shortness of breath and chest pain.   She reports fetal movements.  Objective: Vitals:   08/02/20 1715 08/02/20 2342 08/03/20 0612 08/03/20 1233  BP: 106/68 113/61 96/74 108/73  Pulse: 76 83 87 88  Resp: 18 17 16 16   Temp: 97.6 F (36.4 C) 97.9 F (36.6 C) 97.8 F (36.6 C) 97.7 F (36.5 C)  TempSrc:  Oral Oral Oral Oral  SpO2: 97% 98% 96% 94%  Weight:   82.4 kg   Height:       No intake or output data in the 24 hours ending 08/03/20 1454 Filed Weights   08/01/20 5329 08/02/20 0451 08/03/20 0612  Weight: 81.9 kg 82.2 kg 82.4 kg    Examination:  General exam: Appears calm and comfortable, not in any acute distress Respiratory system: Clear to auscultation. Respiratory effort normal. Cardiovascular system: S1 & S2 heard, RRR. No JVD, murmurs, rubs, gallops or clicks. No pedal edema. Gastrointestinal system: Abdomen is nondistended, soft and nontender. No organomegaly or masses felt. Normal bowel sounds heard. Central nervous system: Alert and oriented. No focal neurological deficits. Extremities: Symmetric 5 x 5 power. Skin: No rashes, lesions or ulcers Psychiatry: Judgement and insight appear normal. Mood & affect appropriate.     Data Reviewed: I have personally reviewed following labs and imaging studies  CBC: Recent Labs  Lab 07/29/20 0500 07/30/20 0500 07/31/20 0213 08/02/20 0446  08/03/20 0334  WBC 12.1* 9.6 9.6 9.2  --   HGB 9.1* 9.0* 8.9* 8.5* 8.3*  HCT 27.8* 27.4* 26.3* 25.5* 26.1*  MCV 89.4 89.3 88.9 90.4  --   PLT 457* 413* 406* 330  --    Basic Metabolic Panel: Recent Labs  Lab 07/28/20 0626 07/29/20 0500 07/30/20 0500 07/31/20 0213 08/02/20 0446 08/03/20 0334  NA  --  130* 132* 130* 132*  --   K  --  4.0 3.9 4.1 4.0  --   CL  --  95* 97* 97* 97*  --   CO2  --  27 27 27 28   --   GLUCOSE 86 85 77 93 83  --   BUN  --  17 14 17 14   --   CREATININE  --  0.52 0.51 0.70 0.52 0.52  CALCIUM  --  9.2 9.1 9.2 9.4  --   MG  --   --   --   --  1.7  --   PHOS  --   --   --   --  4.4  --    GFR: Estimated Creatinine Clearance: 109.4 mL/min (by C-G formula based on SCr of 0.52 mg/dL). Liver Function Tests: Recent Labs  Lab 08/02/20 0446  AST 22  ALT 12  ALKPHOS 96  BILITOT 0.5  PROT 6.9  ALBUMIN 2.3*   No results for input(s): LIPASE, AMYLASE in the last 168 hours. No results for input(s): AMMONIA in the last 168 hours. Coagulation Profile: No results for input(s): INR, PROTIME in the last 168 hours. Cardiac Enzymes: No results for input(s): CKTOTAL, CKMB, CKMBINDEX, TROPONINI in the last 168 hours. BNP (last 3 results) No results for input(s): PROBNP in the last 8760 hours. HbA1C: No results for input(s): HGBA1C in the last 72 hours. CBG: Recent Labs  Lab 08/02/20 1145 08/02/20 1639 08/02/20 2141 08/03/20 0610 08/03/20 1112  GLUCAP 91 131* 92 82 125*   Lipid Profile: No results for input(s): CHOL, HDL, LDLCALC, TRIG, CHOLHDL, LDLDIRECT in the last 72 hours. Thyroid Function Tests: No results for input(s): TSH, T4TOTAL, FREET4, T3FREE, THYROIDAB in the last 72 hours. Anemia Panel: No results for input(s): VITAMINB12, FOLATE, FERRITIN, TIBC, IRON, RETICCTPCT in the last 72 hours. Sepsis Labs: No results for input(s): PROCALCITON, LATICACIDVEN in the last 168 hours.  No results found for this or any previous visit (from the past 240  hour(s)).   Radiology Studies: [place] MFM OB LIMITED  Result Date: 08/02/2020 ----------------------------------------------------------------------  OBSTETRICS REPORT                       (Signed Final 08/02/2020 01:36 pm) ---------------------------------------------------------------------- Patient Info  ID #:       [id number]                          D.O.B.:  [birth year] (34 yrs)  Name:       [name]              Visit Date: 08/02/2020 11:49 am ---------------------------------------------------------------------- Performed By  Attending:        Sander Nephew      Ref. Address:     [place]                    [address]                                                             [address]  Performed By:     Hubert Azure          [facility]                    [place]  Referred By:      Sloan Leiter                    MD ---------------------------------------------------------------------- Orders  #  Description                           Code        Ordered By  1  [place] MFM OB LIMITED                     [id number]    PEGGY CONSTANT ----------------------------------------------------------------------  #  Order #                     Accession #                Episode #  1  [id number]                   [id number]                 [id number] ---------------------------------------------------------------------- Indications  [redacted] weeks gestation of pregnancy                Z3A.29  Heart disease in mother affecting pregnancy    O99.413  in third trimester  Hypertension - Chronic/Pre-existing            Z66.294  Drug use complicating pregnancy, third         O101.323  trimester  Advanced maternal age multigravida 21+,        O65.523  third trimester  Previous cesarean delivery, antepartum x1      O34.219 ---------------------------------------------------------------------- Fetal Evaluation  Num Of  Fetuses:         1  Fetal Heart Rate(bpm):  132   Cardiac Activity:       Observed  Presentation:           Transverse, head to maternal left  Placenta:               Anterior  P. Cord Insertion:      Visualized, central  Amniotic Fluid  AFI FV:      Within normal limits  AFI Sum(cm)     %Tile       Largest Pocket(cm)  11.59           25          4.19  RUQ(cm)       RLQ(cm)       LUQ(cm)        LLQ(cm)  4.19          3.07          1.83           2.5  Comment:    Stomach and bladder noted. ---------------------------------------------------------------------- OB History  Gravidity:    7         Term:   3        Prem:   0        SAB:   3  TOP:          0       Ectopic:  0        Living: 3 ---------------------------------------------------------------------- Gestational Age  Best:          29w 0d     Det. By:  U/S  (07/13/20)          EDD:   10/18/20 ---------------------------------------------------------------------- Impression  Limited exam to assess amniotic fluid volume.  Good fetal movement and amniotic fluid.  Transverse presentation. ---------------------------------------------------------------------- Recommendations  Management per inpatient provider. ----------------------------------------------------------------------               Sander Nephew, MD Electronically Signed Final Report   08/02/2020 01:36 pm ----------------------------------------------------------------------   Scheduled Meds: . sodium chloride   Intravenous Once  . acetaminophen  650 mg Oral Q6H  . Chlorhexidine Gluconate Cloth  6 each Topical Daily  . enoxaparin (LOVENOX) injection  40 mg Subcutaneous Q24H  . feeding supplement  237 mL Oral BID BM  . furosemide  40 mg Oral Daily  . methadone  100 mg Oral Daily  . polyethylene glycol  17 g Oral BID  . prenatal vitamin w/FE, FA  1 tablet Oral Q1200  . senna-docusate  1 tablet Oral BID  . sodium chloride flush  10-40 mL Intracatheter Q12H   Continuous Infusions: . vancomycin 750 mg (08/03/20 0955)     LOS: 21 days     Time spent: 25 mins    Obediah Welles, MD [facility]   If 7PM-7AM, please contact night-coverage

## 2020-08-03 NOTE — Progress Notes (Signed)
Nutrition Follow-up  DOCUMENTATION CODES:   Not applicable  INTERVENTION:   Continue to offer Ensure Enlive po BID, each supplement provides 350 kcal and 20 grams of protein.  Offer snacks between meals.   Encourage adequate PO intake.   Continue prenatal vitamin once daily.   NUTRITION DIAGNOSIS:   Increased nutrient needs related to chronic illness (heart failure) as evidenced by estimated needs.  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 Pt noted to be fluid overload in addition blood cultures were positive for MRSA. Pt found to be pregnant.   Pt is currently [redacted] weeks pregnant.  Plans for 6 weeks of IV antibiotics for MRSA sepsis, suspected recurrent endocarditis.  Patient to remain inpatient until IV antibiotics are completed.   Unable to speak with patient today. Remains on a regular diet; being offered Ensure Enlive/Plus BID between meals. . Meal intakes documented at 40-100%. She is refusing some Ensure supplements; will add snacks between meals.   Labs and medications reviewed.   Diet Order:   Diet Order            Diet regular Room service appropriate? Yes; Fluid consistency: Thin  Diet effective now                 EDUCATION NEEDS:   Education needs have been addressed  Skin:  Skin Assessment: Reviewed RN  Assessment  Last BM:  3/22  Height:   Ht Readings from Last 1 Encounters:  07/13/20 5' 7.5" (1.715 m)    Weight:   Wt Readings from Last 1 Encounters:  08/03/20 82.4 kg   BMI:  Body mass index is 28.04 kg/m.  Estimated Nutritional Needs:   Kcal:  2100-2300  Protein:  105-115 grams  Fluid:  >/= 2 L/day   Lucas Mallow, RD, LDN, CNSC Please refer to Amion for contact information.

## 2020-08-04 DIAGNOSIS — B9562 Methicillin resistant Staphylococcus aureus infection as the cause of diseases classified elsewhere: Secondary | ICD-10-CM | POA: Diagnosis not present

## 2020-08-04 DIAGNOSIS — R7881 Bacteremia: Secondary | ICD-10-CM | POA: Diagnosis not present

## 2020-08-04 LAB — BASIC METABOLIC PANEL
Anion gap: 7 (ref 5–15)
BUN: 12 mg/dL (ref 6–20)
CO2: 27 mmol/L (ref 22–32)
Calcium: 9 mg/dL (ref 8.9–10.3)
Chloride: 98 mmol/L (ref 98–111)
Creatinine, Ser: 0.5 mg/dL (ref 0.44–1.00)
GFR, Estimated: >60 mL/min (ref >60)
Glucose, Bld: 89 mg/dL (ref 70–99)
Potassium: 3.8 mmol/L (ref 3.5–5.1)
Sodium: 132 mmol/L — ABNORMAL LOW (ref 135–145)

## 2020-08-04 LAB — PHOSPHORUS: Phosphorus: 4.4 mg/dL (ref 2.5–4.6)

## 2020-08-04 LAB — GLUCOSE, CAPILLARY
Glucose-Capillary: 96 mg/dL (ref 70–99)
Glucose-Capillary: 97 mg/dL (ref 70–99)
Glucose-Capillary: 94 mg/dL (ref 70–99)
Glucose-Capillary: 109 mg/dL — ABNORMAL HIGH (ref 70–99)
Glucose-Capillary: 90 mg/dL (ref 70–99)

## 2020-08-04 LAB — MAGNESIUM: Magnesium: 1.6 mg/dL — ABNORMAL LOW (ref 1.7–2.4)

## 2020-08-04 LAB — VANCOMYCIN, PEAK: Vancomycin Pk: 28 ug/mL — ABNORMAL LOW (ref 30–40)

## 2020-08-04 LAB — VANCOMYCIN, TROUGH: Vancomycin Tr: 11 ug/mL — ABNORMAL LOW (ref 15–20)

## 2020-08-04 MED ORDER — MAGNESIUM SULFATE 2 GM/50ML IV SOLN
2.0000 g | Freq: Once | INTRAVENOUS | Status: AC
  Administered 2020-08-04: 2 g via INTRAVENOUS
  Filled 2020-08-04: qty 50

## 2020-08-04 NOTE — Progress Notes (Signed)
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 -Current active IV heroin user, uses about 3 g daily and IV fentanyl, presented to the ED with 3 to 4 weeks history of bilateral lower extremity edema associated with worsening dyspnea on exertion for 4 days, intermittent fever for several weeks and pain with deep inspiration across her right lateral chest and flank. -In the ED she was noted to have fluid overload, blood cultures were positive for MRSA and she was a started on IV vancomycin. -2D echo without obvious vegetation, TEE was deferred and plan is to treat empirically for endocarditis ( 6 weeks of vancomycin) -Patient was also found to be [redacted] weeks Pregnant.Currently 29 weeks pregnancy.She will remain inpatient to complete 6 weeks of IV antibiotics.  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)   Tricuspid regurgitation   Supervision of other high risk pregnancies, third trimester   Preexisting hypertension complicating pregnancy, antepartum   Dental caries   Endocarditis of tricuspid valve   History of VBAC   Lumbar back pain   Unwanted fertility   Acute on chronic congestive heart failure (HCC)   Cellulitis of right lower extremity   Septic embolism (HCC)   Syphilis   [redacted] weeks gestation of  pregnancy   Flank pain   Drug use affecting pregnancy in second trimester   Methadone maintenance treatment affecting pregnancy ([place])   Maternal endocarditis complicating pregnancy in third trimester   Pain, dental   29 weeks pregnancy : -Management per primary service, OB  MRSA bacteremia with presumed endocarditis : -Per ID recommendations, continue IV vancomycin -2D echo without obvious vegetations, TEE deferred as empirically treating as endocarditis -Repeat blood cultures so far negative. -On IV vancomycin x 6 weeks, afebrile, 07/31/2020: Complete course of antibiotics. She will remain in hospital to complete 6 weeks of IV antibiotics.  Acute on chronic right-sided heart failure with severe TR, severe pulmonary hypertension -2D echo showed decreased RV systolic function, elevated PA pressure 92, severe TR Echo 3/23: LVEF 50-55%, Mobile echodensity likely representing vegetation measuring 1cm x  0.6 cm seen on anterior leaflet of tricuspid valve. - She is being followed by cardiology/CHF team, transition to Lasix oral on 3/12  - Negative balance of 8.7 L, continue Lasix 40 mg PO daily  Anemia of chronic disease - Baseline hemoglobin 8.5-9.5 - B12 445, folate 7.4, iron 32, ferritin 111 -Continue prenatal vitamins, H&H stable, 8.9  History of IV heroin and fentanyl abuse, chronic pain syndrome -Patient now started on methadone 100 mg daily on 3/15, she has previously tolerated 100 mg of methadone daily -Dilaudid decreased to 0.5- 1 mg every 4 hours as needed for severe/breakthrough pain -HCV antibody+, hep A, hep B negative -Overnight 3/18 heart rate increased to 120s, patient reported that she had vomited her methadone during the day and was having withdrawals at night, received extra dose  of methadone at 2200 -Currently no complaints, feels back to her baseline, denies nausea, vomiting  Pneumonia, possibly septic pulmonary infarcts -Continue IV  vancomycin.  Syphilis -Positive RPR with rash per ID, could be secondary syphilis -Penicillin 2.4MU IM given  Hyponatremia -Stable,132  Back pain -Unable to tolerate MRI, was discontinued, currently on IV antibiotics  Jaw pain, tooth pain, dental caries -Placed on Augmentin, multiple caries, outpatient follow-up [facility]    DVT prophylaxis: Lovenox Code Status: Full code. Family Communication: No family at bed side. Disposition Plan:  Status is: Inpatient  Remains inpatient appropriate because:Inpatient level of care appropriate due to severity of illness   Dispo: The patient is from: Home              Anticipated d/c is to: Home              Patient currently is not medically stable to d/c.   Difficult to place patient No   Consultants:   Cardiology, infectious diseases, cardiothoracic surgery  Procedures:   Antimicrobials:  Anti-infectives (From admission, onward)   Start     Dose/Rate Route Frequency Ordered Stop   07/22/20 1045  amoxicillin-clavulanate (AUGMENTIN) 875-125 MG per tablet 1 tablet        1 tablet Oral Every 12 hours 07/22/20 0952 07/31/20 2023   07/18/20 0830  vancomycin (VANCOCIN) IVPB 750 mg/150 ml premix        750 mg 150 mL/hr over 60 Minutes Intravenous Every 8 hours 07/18/20 0750     07/16/20 0400  Vancomycin (VANCOCIN) 1,250 mg in sodium chloride 0.9 % 250 mL IVPB  Status:  Discontinued        1,250 mg 166.7 mL/hr over 90 Minutes Intravenous Every 8 hours 07/15/20 1944 07/18/20 0749   07/15/20 1715  penicillin g benzathine (BICILLIN LA) 1200000 UNIT/2ML injection 2.4 Million Units        2.4 Million Units Intramuscular  Once 07/15/20 1621 07/15/20 1747   07/14/20 0300  vancomycin (VANCOCIN) IVPB 1000 mg/200 mL premix        1,000 mg 200 mL/hr over 60 Minutes Intravenous Every 8 hours 07/13/20 1806 07/15/20 2059   07/13/20 1730  Vancomycin (VANCOCIN) 1,500 mg in sodium chloride 0.9 % 500 mL IVPB        1,500  mg 250 mL/hr over 120 Minutes Intravenous  Once 07/13/20 1709 07/13/20 2116   07/13/20 1730  ceFEPIme (MAXIPIME) 2 g in sodium chloride 0.9 % 100 mL IVPB  Status:  Discontinued        2 g 200 mL/hr over 30 Minutes Intravenous Every 8 hours 07/13/20 1709 07/14/20 1050   07/13/20 1315  cefTRIAXone (ROCEPHIN) 1 g in sodium chloride 0.9 % 100 mL IVPB        1 g 200 mL/hr over 30 Minutes Intravenous  Once 07/13/20 1306 07/13/20 1538   07/13/20 1315  azithromycin (ZITHROMAX) 500 mg in sodium chloride 0.9 % 250 mL IVPB        500 mg 250 mL/hr over 60 Minutes Intravenous  Once 07/13/20 1306 07/13/20 1644      Subjective: Patient was seen and examined at bedside.  Overnight events noted.  She is lying comfortably in the bed. Patient denies any shortness of breath and chest pain.   She reports fetal movements.  Objective: Vitals:   08/03/20 1844 08/03/20 2346 08/04/20 0525 08/04/20 1257  BP: 110/65 105/60 111/71 108/69  Pulse: 76 65 71 86  Resp: 16 20  18 18  Temp: 97.6 F (36.4 C) 98 F (36.7 C) 98 F (36.7 C) 98 F (36.7 C)  TempSrc: Oral Oral Oral Oral  SpO2: 97% 97% 99% 96%  Weight:   82.1 kg   Height:        Intake/Output Summary (Last 24 hours) at 08/04/2020 1624 Last data filed at 08/04/2020 0800 Gross per 24 hour  Intake 480 ml  Output --  Net 480 ml   Filed Weights   08/02/20 0451 08/03/20 0612 08/04/20 0525  Weight: 82.2 kg 82.4 kg 82.1 kg    Examination:  General exam: Appears calm and comfortable, not in any acute distress Respiratory system: Clear to auscultation. Respiratory effort normal. Cardiovascular system: S1 & S2 heard, RRR. No JVD, murmurs, rubs, gallops or clicks. No pedal edema. Gastrointestinal system: Abdomen is nondistended, soft and nontender. No organomegaly or masses felt.  Normal bowel sounds heard. Central nervous system: Alert and oriented. No focal neurological deficits. Extremities: Symmetric 5 x 5 power. No edema, no cyanosis, no  clubbing. Skin: No rashes, lesions or ulcers Psychiatry: Judgement and insight appear normal. Mood & affect appropriate.     Data Reviewed: I have personally reviewed following labs and imaging studies  CBC: Recent Labs  Lab 07/29/20 0500 07/30/20 0500 07/31/20 0213 08/02/20 0446 08/03/20 0334 08/03/20 1550  WBC 12.1* 9.6 9.6 9.2  --   --   HGB 9.1* 9.0* 8.9* 8.5* 8.3* 8.9*  HCT 27.8* 27.4* 26.3* 25.5* 26.1* 27.7*  MCV 89.4 89.3 88.9 90.4  --   --   PLT 457* 413* 406* 330  --   --    Basic Metabolic Panel: Recent Labs  Lab 07/29/20 0500 07/30/20 0500 07/31/20 0213 08/02/20 0446 08/03/20 0334 08/04/20 0410  NA 130* 132* 130* 132*  --  132*  K 4.0 3.9 4.1 4.0  --  3.8  CL 95* 97* 97* 97*  --  98  CO2 27 27 27 28   --  27  GLUCOSE 85 77 93 83  --  89  BUN 17 14 17 14   --  12  CREATININE 0.52 0.51 0.70 0.52 0.52 0.50  CALCIUM 9.2 9.1 9.2 9.4  --  9.0  MG  --   --   --  1.7  --  1.6*  PHOS  --   --   --  4.4  --  4.4   GFR: Estimated Creatinine Clearance: 109.2 mL/min (by C-G formula based on SCr of 0.5 mg/dL). Liver Function Tests: Recent Labs  Lab 08/02/20 0446  AST 22  ALT 12  ALKPHOS 96  BILITOT 0.5  PROT 6.9  ALBUMIN 2.3*   No results for input(s): LIPASE, AMYLASE in the last 168 hours. No results for input(s): AMMONIA in the last 168 hours. Coagulation Profile: No results for input(s): INR, PROTIME in the last 168 hours. Cardiac Enzymes: No results for input(s): CKTOTAL, CKMB, CKMBINDEX, TROPONINI in the last 168 hours. BNP (last 3 results) No results for input(s): PROBNP in the last 8760 hours. HbA1C: No results for input(s): HGBA1C in the last 72 hours. CBG: Recent Labs  Lab 08/03/20 1506 08/03/20 2009 08/04/20 0603 08/04/20 0806 08/04/20 1248  GLUCAP 100* 93 96 97 94   Lipid Profile: No results for input(s): CHOL, HDL, LDLCALC, TRIG, CHOLHDL, LDLDIRECT in the last 72 hours. Thyroid Function Tests: No results for input(s): TSH, T4TOTAL,  FREET4, T3FREE, THYROIDAB in the last 72 hours. Anemia Panel: No results for input(s): VITAMINB12, FOLATE, FERRITIN, TIBC,  IRON, RETICCTPCT in the last 72 hours. Sepsis Labs: No results for input(s): PROCALCITON, LATICACIDVEN in the last 168 hours.  No results found for this or any previous visit (from the past 240 hour(s)).   Radiology Studies: ECHOCARDIOGRAM LIMITED  Result Date: 08/03/2020    ECHOCARDIOGRAM LIMITED REPORT   Patient Name:   TEKEYA GEFFERT Date of Exam: 08/03/2020 Medical Rec #:  [id number]          Height:       67.5 in Accession #:    [id number]         Weight:       181.7 lb Date of Birth:  11/15/1985           BSA:          1.952 m Patient Age:    23 years           BP:           96/74 mmHg Patient Gender: F                  HR:           70 bpm. Exam Location:  Inpatient Procedure: Limited Echo, Cardiac Doppler and Color Doppler Indications:    I36.1 Nonrheumatic tricuspid (valve) insufficiency  History:        Patient has prior history of Echocardiogram examinations, most                 recent 07/13/2020. Endocarditis; Signs/Symptoms:Shortness of                 Breath and Dyspnea. Pregnancy. IVDU. Polysubstance abuse.                 Tricuspid endocarditis. Methadone.  Sonographer:    Roseanna Rainbow RDCS Referring Phys: [place]  1. Left ventricular ejection fraction, by estimation, is 50 to 55%. The left ventricle has low normal function. The left ventricle has no regional wall motion abnormalities. There is mild left ventricular hypertrophy. There is the interventricular septum is flattened in diastole ('D' shaped left ventricle), consistent with right ventricular volume overload.  2. Right ventricular systolic function is normal. The right ventricular size is severely enlarged. There is normal pulmonary artery systolic pressure. The estimated right ventricular systolic pressure is 74.0 mmHg.  3. Right atrial size was severely dilated.  4. The mitral valve  is normal in structure. No evidence of mitral valve regurgitation.  5. The aortic valve is tricuspid. Aortic valve regurgitation is not visualized. No aortic stenosis is present.  6. The inferior vena cava is normal in size with greater than 50% respiratory variability, suggesting right atrial pressure of 3 mmHg.  7. Mobile echodensity likely representing vegetation measuring 1cm x 0.6cm seen on anterior leaflet of tricuspid valve (best seen on images 49-50). Tricuspid valve regurgitation is severe. FINDINGS  Left Ventricle: Left ventricular ejection fraction, by estimation, is 50 to 55%. The left ventricle has low normal function. The left ventricle has no regional wall motion abnormalities. The left ventricular internal cavity size was normal in size. There is mild left ventricular hypertrophy. The interventricular septum is flattened in diastole ('D' shaped left ventricle), consistent with right ventricular volume overload. Right Ventricle: The right ventricular size is severely enlarged. Right ventricular systolic function is normal. There is normal pulmonary artery systolic pressure. The tricuspid regurgitant velocity is 2.75 m/s, and with an assumed right atrial pressure  of 3 mmHg, the estimated right ventricular  systolic pressure is 25.9 mmHg. Right Atrium: Right atrial size was severely dilated. Pericardium: There is no evidence of pericardial effusion. Mitral Valve: The mitral valve is normal in structure. Tricuspid Valve: Mobile echodensity likely representing vegetation measuring 1cm x 0.6cm seen on anterior leaflet of tricuspid valve (best seen on images 49-50). The tricuspid valve is abnormal. Tricuspid valve regurgitation is severe. Aortic Valve: The aortic valve is tricuspid. Aortic valve regurgitation is not visualized. No aortic stenosis is present. Aorta: The aortic root and ascending aorta are structurally normal, with no evidence of dilitation. Venous: The inferior vena cava is normal in size  with greater than 50% respiratory variability, suggesting right atrial pressure of 3 mmHg. LEFT VENTRICLE PLAX 2D LVIDd:         4.40 cm LVIDs:         3.10 cm LV PW:         1.20 cm LV IVS:        1.20 cm  LV Volumes (MOD) LV vol d, MOD A2C: 149.0 ml LV vol d, MOD A4C: 75.2 ml LV vol s, MOD A2C: 57.4 ml LV vol s, MOD A4C: 36.7 ml LV SV MOD A2C:     91.6 ml LV SV MOD A4C:     75.2 ml LV SV MOD BP:      58.0 ml LEFT ATRIUM         Index      RIGHT ATRIUM           Index LA diam:    3.40 cm 1.74 cm/m RA Area:     30.70 cm                                RA Volume:   118.00 ml 60.45 ml/m   AORTA Ao Root diam: 3.10 cm Ao Asc diam:  2.90 cm TRICUSPID VALVE TR Peak grad:   30.2 mmHg TR Vmax:        275.00 cm/s Oswaldo Milian MD Electronically signed by Oswaldo Milian MD Signature Date/Time: 08/03/2020/7:13:46 PM    Final     Scheduled Meds: . sodium chloride   Intravenous Once  . acetaminophen  650 mg Oral Q6H  . Chlorhexidine Gluconate Cloth  6 each Topical Daily  . enoxaparin (LOVENOX) injection  40 mg Subcutaneous Q24H  . feeding supplement  237 mL Oral BID BM  . furosemide  40 mg Oral Daily  . methadone  100 mg Oral Daily  . polyethylene glycol  17 g Oral BID  . prenatal vitamin w/FE, FA  1 tablet Oral Q1200  . senna-docusate  1 tablet Oral BID  . sodium chloride flush  10-40 mL Intracatheter Q12H   Continuous Infusions: . vancomycin 750 mg (08/04/20 1239)     LOS: 22 days    Time spent: 25 mins    Paul Trettin, MD [facility]   If 7PM-7AM, please contact night-coverage

## 2020-08-04 NOTE — Progress Notes (Signed)
NST reactive and reassuring for gestation.  Pt has no complaints of LOF, vag bleeding, or UCs.  Audible fetal movements noted.  Pt seems to be in good spirits.  Says she gets anxious at times.  Dr Nelda Marseille shown fhr tracing and updated about pt.

## 2020-08-04 NOTE — Progress Notes (Signed)
Pharmacy Antibiotic Note  Kelly [name] is a 35 y.o. female with MRSA bacteremia  And TV endocarditis. 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  Pharmacy has been consulted for vancomycin. Patient is pregnant with EGA of [redacted] weeks.  Vancomycin peak came back at 28, vancomycin trough came back at 11 - calculated AUC was therapeutic at 478. Scr remains stable at 0.5 (CrCl >100 mL/min). Remains afebrile.    Planning 6 weeks of inpatient IV antibiotics.  Plan: Continue vancomycin to 750 mg IV q 8 hrs Monitor C&S, clinical status, renal function, VT as needed   Temp (24hrs), Avg:97.9 F (36.6 C), Min:97.6 F (36.4 C), Max:98 F (36.7 C)  Recent Labs  Lab 07/29/20 0500 07/29/20 1830 07/30/20 0500 07/30/20 0730 07/31/20 0213 08/02/20 0446 08/03/20 0334 08/04/20 0409 08/04/20 0410 08/04/20 1018  WBC 12.1*  --  9.6  --  9.6 9.2  --   --   --   --   CREATININE 0.52  --  0.51  --  0.70 0.52 0.52  --  0.50  --   VANCOTROUGH  --   --   --  13*  --   --   --   --   --  11*  VANCOPEAK  --  28*  --   --   --   --   --  28*  --   --     Estimated Creatinine Clearance: 109.2 mL/min (by C-G formula based on SCr of 0.5 mg/dL).    Allergies  Allergen Reactions  . Hydrocodone Itching  . Morphine And Related Nausea And Vomiting    Antimicrobials this admission: Azith 3/2 x1 CTX 3/2 x1 Vancomycin 3/2> Cefepime 3/2> 3/3  Cultures: 3/2 urine - ng 3/2 Bld - GPC - MRSA 3/2 MRSA PCR pos 3/4 BCx x 2 > Neg  Thank you for allowing pharmacy to be a part of this patient's care.  Antonietta Jewel, PharmD, [place] Clinical Pharmacist  Phone: [phone] 08/04/2020 1:43 PM  Please check AMION for all [place] phone numbers After 10:00 PM, call [place] [phone]

## 2020-08-05 DIAGNOSIS — O99323 Drug use complicating pregnancy, third trimester: Secondary | ICD-10-CM

## 2020-08-05 DIAGNOSIS — Z3A28 28 weeks gestation of pregnancy: Secondary | ICD-10-CM | POA: Diagnosis not present

## 2020-08-05 DIAGNOSIS — O99891 Other specified diseases and conditions complicating pregnancy: Secondary | ICD-10-CM | POA: Diagnosis not present

## 2020-08-05 DIAGNOSIS — O10913 Unspecified pre-existing hypertension complicating pregnancy, third trimester: Secondary | ICD-10-CM

## 2020-08-05 DIAGNOSIS — B9562 Methicillin resistant Staphylococcus aureus infection as the cause of diseases classified elsewhere: Secondary | ICD-10-CM | POA: Diagnosis not present

## 2020-08-05 DIAGNOSIS — Z3A29 29 weeks gestation of pregnancy: Secondary | ICD-10-CM

## 2020-08-05 DIAGNOSIS — R7881 Bacteremia: Secondary | ICD-10-CM | POA: Diagnosis not present

## 2020-08-05 DIAGNOSIS — O99413 Diseases of the circulatory system complicating pregnancy, third trimester: Secondary | ICD-10-CM | POA: Diagnosis not present

## 2020-08-05 LAB — PHOSPHORUS: Phosphorus: 4.2 mg/dL (ref 2.5–4.6)

## 2020-08-05 LAB — BASIC METABOLIC PANEL
Anion gap: 7 (ref 5–15)
BUN: 13 mg/dL (ref 6–20)
CO2: 25 mmol/L (ref 22–32)
Calcium: 9 mg/dL (ref 8.9–10.3)
Chloride: 99 mmol/L (ref 98–111)
Creatinine, Ser: 0.47 mg/dL (ref 0.44–1.00)
GFR, Estimated: >60 mL/min (ref >60)
Glucose, Bld: 89 mg/dL (ref 70–99)
Potassium: 4 mmol/L (ref 3.5–5.1)
Sodium: 131 mmol/L — ABNORMAL LOW (ref 135–145)

## 2020-08-05 LAB — GLUCOSE, CAPILLARY
Glucose-Capillary: 95 mg/dL (ref 70–99)
Glucose-Capillary: 80 mg/dL (ref 70–99)

## 2020-08-05 LAB — HEMOGLOBIN AND HEMATOCRIT, BLOOD
HCT: 26.1 % — ABNORMAL LOW (ref 36.0–46.0)
Hemoglobin: 8.3 g/dL — ABNORMAL LOW (ref 12.0–15.0)

## 2020-08-05 LAB — MAGNESIUM: Magnesium: 1.8 mg/dL (ref 1.7–2.4)

## 2020-08-05 NOTE — TOC Progression Note (Signed)
Transition of Care (TOC) - Progression Note  Heart Failure   Patient Details  Name: Kelly [name] MRN: [id number] Date of Birth: 1985-11-30  Transition of Care [name]) CM/SW [place], [place] Phone Number: 08/05/2020, 2:35 PM  Clinical Narrative:    CSW spoke with Kelly [name] who reported to be anxious about discharge and unsure of what the plan is and asked CSW about staying at the hospital until she gives birth or another safe discharge plan. CSW spoke with Kelly [name] about [employer] [facility] and Kelly [name] reported that she did a phone screen and is waiting to hear back if they will accept her into the program either today (Friday) or on Monday. Kelly [name] reported that once she hears back about the phone screen she will let CSW know so that we can come up with a safe discharge plan. Kelly [name] reported that she does not want to go back to "the street" once leaving the hospital and wants to remain sober for her and her baby. CSW provided emotional support and empathetic, active listening and informed Kelly [name] that it is CSW's job is to make sure that she has a safe discharge plan and that we will continue to explore her options especially if [facility] doesn't accept her into the program.  CSW will continue to follow for d/c needs.      Barriers to Discharge: Continued Medical Work [facility] Resources with medical needs,Unsafe home situation,Financial Resources,Active Substance Use - Placement  Expected Discharge Plan and Services   In-house Referral: Clinical Social Work Discharge Planning Services: CM Consult                                           Social Determinants of Health (SDOH) Interventions Food Insecurity Interventions: Other (Comment) (Pt. reports having food stamps) Financial Strain Interventions: Intervention Not Indicated,Other (Comment) (CSW working on connecting Pt. to resources) Housing Interventions: Other (Comment) (Referral to  outpatient residential treatment program for pregnant women) Transportation Interventions: [facility] Alcohol Brief Interventions/Follow-up: Continued Monitoring (Pt. reports that she hasn't had a drink since her pregnancy) Depression Interventions/Treatment : Counseling,Medication (Pt. reports she has been on medication before and needs to get back on medication and she is open to counseling and meds.)  Readmission Risk Interventions Readmission Risk Prevention Plan 07/30/2018  Transportation Screening Complete  Medication Review Press photographer) Complete  PCP or Specialist appointment within 3-5 days of discharge Complete  HRI or [place] Not Complete  HRI or Home Care Consult Pt Refusal Comments N/A  SW Recovery Care/Counseling Consult Complete  Palliative Care Screening Not Complete  Comments N/A  [place] Not Complete  SNF Comments N/A  Some recent data might be hidden   Cortlin Polo, MSW, LCSWA [phone] Heart Failure Social Worker

## 2020-08-05 NOTE — Progress Notes (Signed)
Patient ID: [name], female   DOB: June 17, 1985, 35 y.o.   MRN: [id number] FACULTY PRACTICE ANTEPARTUM PROGRESS NOTE  HEMA LANZA is a 35 y.o. [phone] at 20w3dwho is admitted for MRSA bacteremia, infective endocarditis, severe tricuspid regurgitation, septic emboli with h/o discitis/osteomyelitis/epidural abscess, syphilis, chronic HTN, and acute on chronic right side heart failure in the setting of longstanding h/o IV heroin and fentanyl abuse.Estimated Date of Delivery: 10/18/20 Fetal presentation iscephalicper 31/03/15ultrasound   Length of Stay:  23 Days. Admitted 07/13/2020  Subjective: Patient reports normal fetal movement.  She denies uterine contractions, denies bleeding and leaking of fluid per vagina. She is without any complaints other than persistent back pain.  Vitals:  Blood pressure 103/63, pulse 81, temperature 97.6 F (36.4 C), temperature source Oral, resp. rate 18, height 5' 7.5" (1.715 m), weight 82.4 kg, SpO2 95 %, not currently breastfeeding. Physical Examination: GENERAL: Well-developed, well-nourished female in no acute distress.  LUNGS: Clear to auscultation bilaterally.  HEART: Regular rate and rhythm. ABDOMEN: Soft, nontender, gravid PELVIC: Not indicated EXTREMITIES: No cyanosis, clubbing, or edema, 2+ distal pulses.   Fetal monitoring: FHR: 130 bpm, Variability: moderate, Accelerations: Present, Decelerations: Absent  Uterine activity: no contractions per hour  Results for orders placed or performed during the hospital encounter of 07/13/20 (from the past 48 hour(s))  Glucose, capillary     Status: Abnormal   Collection Time: 08/03/20  3:06 PM  Result Value Ref Range   Glucose-Capillary 100 (H) 70 - 99 mg/dL    Comment: Glucose reference range applies only to samples taken after fasting for at least 8 hours.  Hemoglobin and hematocrit, blood     Status: Abnormal   Collection Time: 08/03/20  3:50 PM  Result Value Ref Range   Hemoglobin 8.9  (L) 12.0 - 15.0 g/dL   HCT 27.7 (L) 36.0 - 46.0 %    Comment: Performed at [facility] [address] [place] Glucose, capillary     Status: None   Collection Time: 08/03/20  8:09 PM  Result Value Ref Range   Glucose-Capillary 93 70 - 99 mg/dL    Comment: Glucose reference range applies only to samples taken after fasting for at least 8 hours.  Vancomycin, peak     Status: Abnormal   Collection Time: 08/04/20  4:09 AM  Result Value Ref Range   Vancomycin Pk 28 (L) 30 - 40 ug/mL    Comment: Performed at [facility] [address] [place] [id number] Basic metabolic panel     Status: Abnormal   Collection Time: 08/04/20  4:10 AM  Result Value Ref Range   Sodium 132 (L) 135 - 145 mmol/L   Potassium 3.8 3.5 - 5.1 mmol/L   Chloride 98 98 - 111 mmol/L   CO2 27 22 - 32 mmol/L   Glucose, Bld 89 70 - 99 mg/dL    Comment: Glucose reference range applies only to samples taken after fasting for at least 8 hours.   BUN 12 6 - 20 mg/dL   Creatinine, Ser 0.50 0.44 - 1.00 mg/dL   Calcium 9.0 8.9 - 10.3 mg/dL   GFR, Estimated >60 >60 mL/min    Comment: (NOTE) Calculated using the CKD-EPI Creatinine Equation (2021)    Anion gap 7 5 - 15    Comment: Performed at [address] [address] [place] [address] Magnesium     Status: Abnormal  Collection Time: 08/04/20  4:10 AM  Result Value Ref Range   Magnesium 1.6 (L) 1.7 - 2.4 mg/dL    Comment: Performed at [facility] [address] [place], [place] [address]  Phosphorus     Status: None   Collection Time: 08/04/20  4:10 AM  Result Value Ref Range   Phosphorus 4.4 2.5 - 4.6 mg/dL    Comment: Performed at [facility] [address], [place], [place] [address]  Glucose, capillary     Status: None   Collection Time: 08/04/20  6:03 AM  Result Value Ref Range   Glucose-Capillary 96 70 - 99 mg/dL    Comment: Glucose reference range applies only to samples taken  after fasting for at least 8 hours.  Glucose, capillary     Status: None   Collection Time: 08/04/20  8:06 AM  Result Value Ref Range   Glucose-Capillary 97 70 - 99 mg/dL    Comment: Glucose reference range applies only to samples taken after fasting for at least 8 hours.  Vancomycin, trough     Status: Abnormal   Collection Time: 08/04/20 10:18 AM  Result Value Ref Range   Vancomycin Tr 11 (L) 15 - 20 ug/mL    Comment: Performed at [facility], [place] [address], [place], [place] [address]  Glucose, capillary     Status: None   Collection Time: 08/04/20 12:48 PM  Result Value Ref Range   Glucose-Capillary 94 70 - 99 mg/dL    Comment: Glucose reference range applies only to samples taken after fasting for at least 8 hours.  Glucose, capillary     Status: Abnormal   Collection Time: 08/04/20  4:35 PM  Result Value Ref Range   Glucose-Capillary 109 (H) 70 - 99 mg/dL    Comment: Glucose reference range applies only to samples taken after fasting for at least 8 hours.  Glucose, capillary     Status: None   Collection Time: 08/04/20  8:30 PM  Result Value Ref Range   Glucose-Capillary 90 70 - 99 mg/dL    Comment: Glucose reference range applies only to samples taken after fasting for at least 8 hours.  Hemoglobin and hematocrit, blood     Status: Abnormal   Collection Time: 08/05/20  3:28 AM  Result Value Ref Range   Hemoglobin 8.3 (L) 12.0 - 15.0 g/dL   HCT 26.1 (L) 36.0 - 46.0 %    Comment: Performed at [facility] [address], [place], [place] [address]  Magnesium     Status: None   Collection Time: 08/05/20  3:28 AM  Result Value Ref Range   Magnesium 1.8 1.7 - 2.4 mg/dL    Comment: Performed at [facility] [address] [place], [place] [address]  Phosphorus     Status: None   Collection Time: 08/05/20  3:28 AM  Result Value Ref Range   Phosphorus 4.2 2.5 - 4.6 mg/dL    Comment: Performed at [facility] [address] [place], [place]  [address]  Basic metabolic panel     Status: Abnormal   Collection Time: 08/05/20  3:28 AM  Result Value Ref Range   Sodium 131 (L) 135 - 145 mmol/L   Potassium 4.0 3.5 - 5.1 mmol/L   Chloride 99 98 - 111 mmol/L   CO2 25 22 - 32 mmol/L   Glucose, Bld 89 70 - 99 mg/dL    Comment: Glucose reference  range applies only to samples taken after fasting for at least 8 hours.   BUN 13 6 - 20 mg/dL   Creatinine, Ser 0.47 0.44 - 1.00 mg/dL   Calcium 9.0 8.9 - 10.3 mg/dL   GFR, Estimated >60 >60 mL/min    Comment: (NOTE) Calculated using the CKD-EPI Creatinine Equation (2021)    Anion gap 7 5 - 15    Comment: Performed at [facility] [address], [place], [place] [address]  Glucose, capillary     Status: None   Collection Time: 08/05/20  5:52 AM  Result Value Ref Range   Glucose-Capillary 95 70 - 99 mg/dL    Comment: Glucose reference range applies only to samples taken after fasting for at least 8 hours.  Glucose, capillary     Status: None   Collection Time: 08/05/20 10:46 AM  Result Value Ref Range   Glucose-Capillary 80 70 - 99 mg/dL    Comment: Glucose reference range applies only to samples taken after fasting for at least 8 hours.    I have reviewed the patient's current medications.  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)   Tricuspid regurgitation   Supervision of other high risk pregnancies, third trimester   Preexisting hypertension complicating pregnancy, antepartum   Dental caries   Endocarditis of tricuspid valve   History of VBAC   Lumbar back pain   Unwanted fertility   Acute on chronic congestive heart failure (HCC)   Cellulitis of right lower extremity   Septic  embolism (HCC)   Syphilis   [redacted] weeks gestation of pregnancy   Flank pain   Drug use affecting pregnancy in second trimester   Methadone maintenance treatment affecting pregnancy ([place])   Maternal endocarditis complicating pregnancy in third trimester   Pain, dental   PLAN: - Patient did not tolerate her glucola drink- CBGs reviewed and over 90% within normal range. Will discontinue CBG monitoring - cont methadone 100 mg daily,  - Management per IM, ID and Cardiology teams as current hospitalization is not related to pregnancy - Continue antibiotic therapies as per ID - Lumbar MRI ordered after discussion with ID - Appreciate the multidisciplinary care for this complex patient. - From an OB standpoint, fetal well being reassuring. Reassuring FHT - Normal AFI on 3/22 with fetus in transverse position -For TOLAC and BTL- BTL forms signed on 3/23 - We will continue to follow patient closely. Daily FHR assessment will be done by our Rapid Response OB Nurse as directed, and weekly ultrasounds by our MFM team. - Appreciate the involvement of social work in placement planning upon discharge    Please call [phone] [facility] OB[facility] Consult Attending Monday-Friday 8am - 5pm) or [phone] [facility] OB[facility] Attending On Call all day, every day) for any obstetric concerns at any time.   Total consultation time including face-to-face time with patient (>50% of time), reviewing chart and documentation: 25 minutes   Continue routine antenatal care.   Mora Bellman, MD, [place] [facility] [employer] (Faculty Practice)  08/05/2020 11:19 AM

## 2020-08-05 NOTE — Progress Notes (Signed)
NST is reactive and reassuring.  Pt has no complaints today although she slept soundly through most of the tracing. Dr Elly Modena made aware and comes to L&D unit to review NST.

## 2020-08-05 NOTE — Progress Notes (Signed)
Subjective:  I came back to see the patient because I was told that she was now interested in getting an MRI of her spine which was not possible to do earlier during her stay.  Antibiotics:  Anti-infectives (From admission, onward)   Start     Dose/Rate Route Frequency Ordered Stop   07/22/20 1045  amoxicillin-clavulanate (AUGMENTIN) 875-125 MG per tablet 1 tablet        1 tablet Oral Every 12 hours 07/22/20 0952 07/31/20 2023   07/18/20 0830  vancomycin (VANCOCIN) IVPB 750 mg/150 ml premix        750 mg 150 mL/hr over 60 Minutes Intravenous Every 8 hours 07/18/20 0750     07/16/20 0400  Vancomycin (VANCOCIN) 1,250 mg in sodium chloride 0.9 % 250 mL IVPB  Status:  Discontinued        1,250 mg 166.7 mL/hr over 90 Minutes Intravenous Every 8 hours 07/15/20 1944 07/18/20 0749   07/15/20 1715  penicillin g benzathine (BICILLIN LA) 1200000 UNIT/2ML injection 2.4 Million Units        2.4 Million Units Intramuscular  Once 07/15/20 1621 07/15/20 1747   07/14/20 0300  vancomycin (VANCOCIN) IVPB 1000 mg/200 mL premix        1,000 mg 200 mL/hr over 60 Minutes Intravenous Every 8 hours 07/13/20 1806 07/15/20 2059   07/13/20 1730  Vancomycin (VANCOCIN) 1,500 mg in sodium chloride 0.9 % 500 mL IVPB        1,500 mg 250 mL/hr over 120 Minutes Intravenous  Once 07/13/20 1709 07/13/20 2116   07/13/20 1730  ceFEPIme (MAXIPIME) 2 g in sodium chloride 0.9 % 100 mL IVPB  Status:  Discontinued        2 g 200 mL/hr over 30 Minutes Intravenous Every 8 hours 07/13/20 1709 07/14/20 1050   07/13/20 1315  cefTRIAXone (ROCEPHIN) 1 g in sodium chloride 0.9 % 100 mL IVPB        1 g 200 mL/hr over 30 Minutes Intravenous  Once 07/13/20 1306 07/13/20 1538   07/13/20 1315  azithromycin (ZITHROMAX) 500 mg in sodium chloride 0.9 % 250 mL IVPB        500 mg 250 mL/hr over 60 Minutes Intravenous  Once 07/13/20 1306 07/13/20 1644      Medications: Scheduled Meds: . sodium chloride   Intravenous Once  .  acetaminophen  650 mg Oral Q6H  . Chlorhexidine Gluconate Cloth  6 each Topical Daily  . enoxaparin (LOVENOX) injection  40 mg Subcutaneous Q24H  . feeding supplement  237 mL Oral BID BM  . furosemide  40 mg Oral Daily  . methadone  100 mg Oral Daily  . polyethylene glycol  17 g Oral BID  . prenatal vitamin w/FE, FA  1 tablet Oral Q1200  . senna-docusate  1 tablet Oral BID  . sodium chloride flush  10-40 mL Intracatheter Q12H   Continuous Infusions: . vancomycin 750 mg (08/05/20 1336)   PRN Meds:.HYDROmorphone (DILAUDID) injection, magic mouthwash w/lidocaine, prochlorperazine    Objective: Weight change: 0.363 kg  Intake/Output Summary (Last 24 hours) at 08/05/2020 1701 Last data filed at 08/05/2020 0900 Gross per 24 hour  Intake 360 ml  Output -  Net 360 ml   Blood pressure 117/85, pulse 89, temperature 97.7 F (36.5 C), temperature source Oral, resp. rate 16, height 5' 7.5" (1.715 m), weight 82.4 kg, SpO2 95 %, not currently breastfeeding. Temp:  [97.6 F (36.4 C)-97.7 F (36.5 C)] 97.7 F (  36.5 C) (03/25 1217) Pulse Rate:  [76-89] 89 (03/25 1217) Resp:  [16-18] 16 (03/25 1217) BP: (100-117)/(63-85) 117/85 (03/25 1217) SpO2:  [95 %-97 %] 95 % (03/25 0614) Weight:  [82.4 kg] 82.4 kg (03/25 4132)  Physical Exam: Physical Exam Constitutional:      General: She is not in acute distress.    Appearance: She is well-developed. She is not diaphoretic.  HENT:     Head: Normocephalic and atraumatic.     Right Ear: External ear normal.     Left Ear: External ear normal.     Mouth/Throat:     Dentition: Abnormal dentition. Gingival swelling present.     Pharynx: No oropharyngeal exudate.  Eyes:     General: No scleral icterus.    Conjunctiva/sclera: Conjunctivae normal.     Pupils: Pupils are equal, round, and reactive to light.  Cardiovascular:     Rate and Rhythm: Normal rate and regular rhythm.     Heart sounds: No murmur heard.   Pulmonary:     Effort: Pulmonary  effort is normal. No respiratory distress.     Breath sounds: No wheezing.  Abdominal:     General: There is no distension.     Palpations: Abdomen is soft.  Musculoskeletal:        General: No tenderness. Normal range of motion.  Lymphadenopathy:     Cervical: No cervical adenopathy.  Skin:    General: Skin is warm and dry.     Coloration: Skin is not pale.     Findings: No erythema or rash.  Neurological:     Mental Status: She is alert and oriented to person, place, and time.     Motor: No abnormal muscle tone.     Coordination: Coordination normal.  Psychiatric:        Attention and Perception: Attention normal.        Mood and Affect: Mood normal. Mood is not anxious.        Speech: Speech normal.        Behavior: Behavior normal.        Thought Content: Thought content normal.        Cognition and Memory: Cognition normal.        Judgment: Judgment normal.      CBC:    BMET Recent Labs    08/04/20 0410 08/05/20 0328  NA 132* 131*  K 3.8 4.0  CL 98 99  CO2 27 25  GLUCOSE 89 89  BUN 12 13  CREATININE 0.50 0.47  CALCIUM 9.0 9.0     Liver Panel  No results for input(s): PROT, ALBUMIN, AST, ALT, ALKPHOS, BILITOT, BILIDIR, IBILI in the last 72 hours.     Sedimentation Rate No results for input(s): ESRSEDRATE in the last 72 hours. C-Reactive Protein No results for input(s): CRP in the last 72 hours.  Micro Results: Recent Results (from the past 720 hour(s))  Resp Panel by RT-PCR (Flu A&B, Covid) Nasopharyngeal Swab     Status: None   Collection Time: 07/13/20 11:21 AM   Specimen: Nasopharyngeal Swab; Nasopharyngeal(NP) swabs in vial transport medium  Result Value Ref Range Status   SARS Coronavirus 2 by RT PCR NEGATIVE NEGATIVE Final    Comment: (NOTE) SARS-CoV-2 target nucleic acids are NOT DETECTED.  The SARS-CoV-2 RNA is generally detectable in upper respiratory specimens during the acute phase of infection. The lowest concentration of  SARS-CoV-2 viral copies this assay can detect is 138 copies/mL. A negative result does not  preclude SARS-Cov-2 infection and should not be used as the sole basis for treatment or other patient management decisions. A negative result may occur with  improper specimen collection/handling, submission of specimen other than nasopharyngeal swab, presence of viral mutation(s) within the areas targeted by this assay, and inadequate number of viral copies(<138 copies/mL). A negative result must be combined with clinical observations, patient history, and epidemiological information. The expected result is Negative.  Fact Sheet for Patients:  EntrepreneurPulse.com.au  Fact Sheet for Healthcare Providers:  IncredibleEmployment.be  This test is no t yet approved or cleared by the [place] FDA and  has been authorized for detection and/or diagnosis of SARS-CoV-2 by FDA under an Emergency Use Authorization (EUA). This EUA will remain  in effect (meaning this test can be used) for the duration of the COVID-19 declaration under Section 564(b)(1) of the Act, 21 U.S.C.section 360bbb-3(b)(1), unless the authorization is terminated  or revoked sooner.       Influenza A by PCR NEGATIVE NEGATIVE Final   Influenza B by PCR NEGATIVE NEGATIVE Final    Comment: (NOTE) The Xpert Xpress SARS-CoV-2/FLU/RSV plus assay is intended as an aid in the diagnosis of influenza from Nasopharyngeal swab specimens and should not be used as a sole basis for treatment. Nasal washings and aspirates are unacceptable for Xpert Xpress SARS-CoV-2/FLU/RSV testing.  Fact Sheet for Patients: EntrepreneurPulse.com.au  Fact Sheet for Healthcare Providers: IncredibleEmployment.be  This test is not yet approved or cleared by the [place] FDA and has been authorized for detection and/or diagnosis of SARS-CoV-2 by FDA under an Emergency Use  Authorization (EUA). This EUA will remain in effect (meaning this test can be used) for the duration of the COVID-19 declaration under Section 564(b)(1) of the Act, 21 U.S.C. section 360bbb-3(b)(1), unless the authorization is terminated or revoked.  Performed at [facility], [place] [address] [place], [place] [address]   Culture, blood (routine x 2)     Status: Abnormal   Collection Time: 07/13/20 12:20 PM   Specimen: BLOOD  Result Value Ref Range Status   Specimen Description BLOOD SITE NOT SPECIFIED  Final   Special Requests   Final    BOTTLES DRAWN AEROBIC AND ANAEROBIC Blood Culture adequate volume   Culture  Setup Time   Final    GRAM POSITIVE COCCI IN CHAINS IN BOTH AEROBIC AND ANAEROBIC BOTTLES Organism ID to follow CRITICAL RESULT CALLED TO, READ BACK BY AND VERIFIED WITH: Performed at [facility], [place] [address], [place],  [address]    Culture METHICILLIN RESISTANT STAPHYLOCOCCUS AUREUS (A)  Final   Report Status 07/16/2020 FINAL  Final   Organism ID, Bacteria METHICILLIN RESISTANT STAPHYLOCOCCUS AUREUS  Final      Susceptibility   Methicillin resistant staphylococcus aureus - MIC*    CIPROFLOXACIN <=0.5 SENSITIVE Sensitive     ERYTHROMYCIN >=8 RESISTANT Resistant     GENTAMICIN <=0.5 SENSITIVE Sensitive     OXACILLIN >=4 RESISTANT Resistant     TETRACYCLINE <=1 SENSITIVE Sensitive     VANCOMYCIN <=0.5 SENSITIVE Sensitive     TRIMETH/SULFA <=10 SENSITIVE Sensitive     CLINDAMYCIN <=0.25 SENSITIVE Sensitive     RIFAMPIN <=0.5 SENSITIVE Sensitive     Inducible Clindamycin NEGATIVE Sensitive     * METHICILLIN RESISTANT STAPHYLOCOCCUS AUREUS  Blood Culture ID Panel (Reflexed)     Status: Abnormal   Collection Time: 07/13/20 12:20 PM  Result Value Ref Range Status   Enterococcus faecalis NOT DETECTED NOT DETECTED Final  Enterococcus Faecium NOT DETECTED NOT DETECTED Final   Listeria monocytogenes NOT DETECTED NOT DETECTED Final   Staphylococcus  species DETECTED (A) NOT DETECTED Final    Comment: CRITICAL RESULT CALLED TO, READ BACK BY AND VERIFIED WITH: PHARMD AMEND CAREN BY MESSAN H. AT 0120 ON 07/14/2020    Staphylococcus aureus (BCID) DETECTED (A) NOT DETECTED Final    Comment: Methicillin (oxacillin)-resistant Staphylococcus aureus (MRSA). MRSA is predictably resistant to beta-lactam antibiotics (except ceftaroline). Preferred therapy is vancomycin unless clinically contraindicated. Patient requires contact precautions if  hospitalized. CRITICAL RESULT CALLED TO, READ BACK BY AND VERIFIED WITH: PHARMD AMEND CAREN BY MESSAN H. AT 0120 ON 07/14/2020    Staphylococcus epidermidis NOT DETECTED NOT DETECTED Final   Staphylococcus lugdunensis NOT DETECTED NOT DETECTED Final   Streptococcus species NOT DETECTED NOT DETECTED Final   Streptococcus agalactiae NOT DETECTED NOT DETECTED Final   Streptococcus pneumoniae NOT DETECTED NOT DETECTED Final   Streptococcus pyogenes NOT DETECTED NOT DETECTED Final   A.calcoaceticus-baumannii NOT DETECTED NOT DETECTED Final   Bacteroides fragilis NOT DETECTED NOT DETECTED Final   Enterobacterales NOT DETECTED NOT DETECTED Final   Enterobacter cloacae complex NOT DETECTED NOT DETECTED Final   Escherichia coli NOT DETECTED NOT DETECTED Final   Klebsiella aerogenes NOT DETECTED NOT DETECTED Final   Klebsiella oxytoca NOT DETECTED NOT DETECTED Final   Klebsiella pneumoniae NOT DETECTED NOT DETECTED Final   Proteus species NOT DETECTED NOT DETECTED Final   Salmonella species NOT DETECTED NOT DETECTED Final   Serratia marcescens NOT DETECTED NOT DETECTED Final   Haemophilus influenzae NOT DETECTED NOT DETECTED Final   Neisseria meningitidis NOT DETECTED NOT DETECTED Final   Pseudomonas aeruginosa NOT DETECTED NOT DETECTED Final   Stenotrophomonas maltophilia NOT DETECTED NOT DETECTED Final   Candida albicans NOT DETECTED NOT DETECTED Final   Candida auris NOT DETECTED NOT DETECTED Final   Candida  glabrata NOT DETECTED NOT DETECTED Final   Candida krusei NOT DETECTED NOT DETECTED Final   Candida parapsilosis NOT DETECTED NOT DETECTED Final   Candida tropicalis NOT DETECTED NOT DETECTED Final   Cryptococcus neoformans/gattii NOT DETECTED NOT DETECTED Final   Meth resistant mecA/C and MREJ DETECTED (A) NOT DETECTED Final    Comment: CRITICAL RESULT CALLED TO, READ BACK BY AND VERIFIED WITH: PHARMD AMEND CAREN BY MESSAN H. AT 0120 ON 07/14/2020 Performed at [facility], [address], [place], [place] [address]   Culture, blood (routine x 2)     Status: Abnormal   Collection Time: 07/13/20 12:21 PM   Specimen: BLOOD  Result Value Ref Range Status   Specimen Description BLOOD SITE NOT SPECIFIED  Final   Special Requests   Final    BOTTLES DRAWN AEROBIC AND ANAEROBIC Blood Culture adequate volume   Culture  Setup Time   Final    GRAM POSITIVE COCCI IN BOTH AEROBIC AND ANAEROBIC BOTTLES CRITICAL VALUE NOTED.  VALUE IS CONSISTENT WITH PREVIOUSLY REPORTED AND CALLED VALUE.    Culture (A)  Final    STAPHYLOCOCCUS AUREUS SUSCEPTIBILITIES PERFORMED ON PREVIOUS CULTURE WITHIN THE LAST 5 DAYS. Performed at [facility], [place] [address] [place], [place] [address]    Report Status 07/16/2020 FINAL  Final  MRSA PCR Screening     Status: Abnormal   Collection Time: 07/13/20  8:37 PM   Specimen: Nasal Mucosa; Nasopharyngeal  Result Value Ref Range Status   MRSA by PCR POSITIVE (A) NEGATIVE Final    Comment:  The GeneXpert MRSA Assay (FDA approved for NASAL specimens only), is one component of a comprehensive MRSA colonization surveillance program. It is not intended to diagnose MRSA infection nor to guide or monitor treatment for MRSA infections. RESULT CALLED TO, READ BACK BY AND VERIFIED WITH: TOLER,M RN 07/13/2020 AT 2319 SKEEN,P Performed at [facility], [place] [address], [place], [place] [address]   Urine culture     Status: None   Collection Time:  07/13/20 10:10 PM   Specimen: Urine, Clean Catch  Result Value Ref Range Status   Specimen Description URINE, CLEAN CATCH  Final   Special Requests NONE  Final   Culture   Final    NO GROWTH Performed at [facility], [address], [place], [place] [address]    Report Status 07/15/2020 FINAL  Final  Culture, blood (routine x 2)     Status: None   Collection Time: 07/15/20  6:49 PM   Specimen: BLOOD  Result Value Ref Range Status   Specimen Description BLOOD RIGHT ANTECUBITAL  Final   Special Requests   Final    BOTTLES DRAWN AEROBIC AND ANAEROBIC BACTEROIDES CACCAE   Culture   Final    NO GROWTH 5 DAYS Performed at [facility], [place] [address], [place], [place] [address]    Report Status 07/20/2020 FINAL  Final  Culture, blood (routine x 2)     Status: None   Collection Time: 07/15/20  6:50 PM   Specimen: BLOOD  Result Value Ref Range Status   Specimen Description BLOOD BLOOD RIGHT FOREARM  Final   Special Requests   Final    BOTTLES DRAWN AEROBIC AND ANAEROBIC Blood Culture adequate volume   Culture   Final    NO GROWTH 5 DAYS Performed at [facility], [place] [address] [place], [place] [address]    Report Status 07/20/2020 FINAL  Final    Studies/Results: No results found.    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)   Tricuspid regurgitation   Supervision of other high risk pregnancies, third trimester   Preexisting hypertension complicating pregnancy, antepartum   Dental caries   Endocarditis of tricuspid valve   History of VBAC   Lumbar back pain   Unwanted  fertility   Acute on chronic congestive heart failure (HCC)   Cellulitis of right lower extremity   Septic embolism (HCC)   Syphilis   [redacted] weeks gestation of pregnancy   Flank pain   Drug use affecting pregnancy in second trimester   Methadone maintenance treatment affecting pregnancy ([place])   Maternal endocarditis complicating pregnancy in third trimester   Pain, dental    Kelly [name] is a 35 y.o. female with admission yet again with right-sided endocarditis with MRSA and septic emboli to the lungs we suspect.  While she does not have overt vegetations on transthoracic echocardiogram I am confident that she has had them and they have embolized into her lungs.  She also has low back pain which could be related to septic embolization to her pregnancy or could be due to pathology  in her lumbar spine.  My understanding from talking to OB was that the patient wanted to have an MRI to reevaluate her spine now and that her pain has been worse.  When I talked to her she told me that her pain was better than it was earlier in her hospital stay but she was agreeable to have an MRI.  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 Baxter Flattery will check this weekend.   Otherwise we will largely follow peripherally but want to be called back at the end of her therapy          LOS: 23 days   Alcide Evener 08/05/2020, 5:01 PM

## 2020-08-05 NOTE — Progress Notes (Signed)
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 -Current active IV heroin user, uses about 3 g daily and IV fentanyl, presented to the ED with 3 to 4 weeks history of bilateral lower extremity edema associated with worsening dyspnea on exertion for 4 days, intermittent fever for several weeks and pain with deep inspiration across her right lateral chest and flank. -In the ED she was noted to have fluid overload, blood cultures were positive for MRSA and she was a started on IV vancomycin. -2D echo without obvious vegetation, TEE was deferred and plan is to treat empirically for endocarditis ( 6 weeks of vancomycin) -Patient was also found to be [redacted] weeks Pregnant.Currently 29+3d weeks pregnancy.She will remain inpatient to complete 6 weeks of IV antibiotics.  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)   Tricuspid regurgitation   Supervision of other high risk pregnancies, third trimester   Preexisting hypertension complicating pregnancy, antepartum   Dental caries   Endocarditis of tricuspid valve   History of VBAC   Lumbar back pain   Unwanted fertility   Acute on chronic congestive heart failure (HCC)   Cellulitis of right lower extremity   Septic embolism (HCC)   Syphilis   [redacted] weeks gestation  of pregnancy   Flank pain   Drug use affecting pregnancy in second trimester   Methadone maintenance treatment affecting pregnancy ([place])   Maternal endocarditis complicating pregnancy in third trimester   Pain, dental   290+3 d weeks pregnancy : -Management per primary service, OB  MRSA bacteremia with presumed endocarditis : -Per ID recommendations, continue IV vancomycin -2D echo without obvious vegetations, TEE deferred as empirically treating as endocarditis -Repeat blood cultures so far negative. -On IV vancomycin x 6 weeks, afebrile, 07/31/2020: Complete course of antibiotics. She will remain in hospital to complete 6 weeks of IV antibiotics.  Acute on chronic right-sided heart failure with severe TR, severe pulmonary hypertension -2D echo showed decreased RV systolic function, elevated PA pressure 92, severe TR Echo 3/23: LVEF 50-55%, Mobile echodensity likely representing vegetation measuring 1cm x  0.6 cm seen on anterior leaflet of tricuspid valve. - She is being followed by cardiology/CHF team, transition to Lasix oral on 3/12  - Negative balance of 8.7 L, continue Lasix 40 mg PO daily  Anemia of chronic disease - Baseline hemoglobin 8.5-9.5 - B12 445, folate 7.4, iron 32, ferritin 111 -Continue prenatal vitamins, H&H stable, 8.9  History of IV heroin and fentanyl abuse, chronic pain syndrome -Patient now started on methadone 100 mg daily on 3/15, she has previously tolerated 100 mg of methadone daily -Dilaudid decreased to 0.5- 1 mg every 4 hours as needed for severe/breakthrough pain -HCV antibody+, hep A, hep B negative -Overnight 3/18 heart rate increased to 120s, patient reported that she had vomited her methadone during the day and was having withdrawals at night, received extra  dose of methadone at 2200 -Currently no complaints, feels back to her baseline, denies nausea, vomiting  Pneumonia, possibly septic pulmonary infarcts -Continue IV  vancomycin.  Syphilis -Positive RPR with rash per ID, could be secondary syphilis -Penicillin 2.4MU IM given  Hyponatremia -Stable,132  Back pain -Unable to tolerate MRI, was discontinued, currently on IV antibiotics  Jaw pain, tooth pain, dental caries -Placed on Augmentin, multiple caries, outpatient follow-up with [facility]    DVT prophylaxis: Lovenox Code Status: Full code. Family Communication: No family at bed side. Disposition Plan:  Status is: Inpatient  Remains inpatient appropriate because:Inpatient level of care appropriate due to severity of illness   Dispo: The patient is from: Home              Anticipated d/c is to: Home              Patient currently is not medically stable to d/c.   Difficult to place patient No   Consultants:   Cardiology, infectious diseases, cardiothoracic surgery  Procedures:   Antimicrobials:  Anti-infectives (From admission, onward)   Start     Dose/Rate Route Frequency Ordered Stop   07/22/20 1045  amoxicillin-clavulanate (AUGMENTIN) 875-125 MG per tablet 1 tablet        1 tablet Oral Every 12 hours 07/22/20 0952 07/31/20 2023   07/18/20 0830  vancomycin (VANCOCIN) IVPB 750 mg/150 ml premix        750 mg 150 mL/hr over 60 Minutes Intravenous Every 8 hours 07/18/20 0750     07/16/20 0400  Vancomycin (VANCOCIN) 1,250 mg in sodium chloride 0.9 % 250 mL IVPB  Status:  Discontinued        1,250 mg 166.7 mL/hr over 90 Minutes Intravenous Every 8 hours 07/15/20 1944 07/18/20 0749   07/15/20 1715  penicillin g benzathine (BICILLIN LA) 1200000 UNIT/2ML injection 2.4 Million Units        2.4 Million Units Intramuscular  Once 07/15/20 1621 07/15/20 1747   07/14/20 0300  vancomycin (VANCOCIN) IVPB 1000 mg/200 mL premix        1,000 mg 200 mL/hr over 60 Minutes Intravenous Every 8 hours 07/13/20 1806 07/15/20 2059   07/13/20 1730  Vancomycin (VANCOCIN) 1,500 mg in sodium chloride 0.9 % 500 mL IVPB        1,500  mg 250 mL/hr over 120 Minutes Intravenous  Once 07/13/20 1709 07/13/20 2116   07/13/20 1730  ceFEPIme (MAXIPIME) 2 g in sodium chloride 0.9 % 100 mL IVPB  Status:  Discontinued        2 g 200 mL/hr over 30 Minutes Intravenous Every 8 hours 07/13/20 1709 07/14/20 1050   07/13/20 1315  cefTRIAXone (ROCEPHIN) 1 g in sodium chloride 0.9 % 100 mL IVPB        1 g 200 mL/hr over 30 Minutes Intravenous  Once 07/13/20 1306 07/13/20 1538   07/13/20 1315  azithromycin (ZITHROMAX) 500 mg in sodium chloride 0.9 % 250 mL IVPB        500 mg 250 mL/hr over 60 Minutes Intravenous  Once 07/13/20 1306 07/13/20 1644      Subjective: Patient was seen and examined at bedside.  Overnight events noted.   She is lying comfortably in the bed. Patient denies any shortness of breath and chest pain.   She reports fetal movements.  Objective: Vitals:   08/04/20 1647 08/05/20 0048 08/05/20 0614 08/05/20 1217  BP: 111/62 100/68 103/63 117/85  Pulse: 85 76 81 89  Resp:  18 18 18 16   Temp: 98 F (36.7 C) 97.6 F (36.4 C) 97.6 F (36.4 C) 97.7 F (36.5 C)  TempSrc: Oral Oral Oral Oral  SpO2: 97% 97% 95%   Weight:   82.4 kg   Height:        Intake/Output Summary (Last 24 hours) at 08/05/2020 1340 Last data filed at 08/05/2020 0900 Gross per 24 hour  Intake 600 ml  Output --  Net 600 ml   Filed Weights   08/03/20 0612 08/04/20 0525 08/05/20 0614  Weight: 82.4 kg 82.1 kg 82.4 kg    Examination:  General exam: Appears calm and comfortable, not in any acute distress Respiratory system: Clear to auscultation. Respiratory effort normal. Cardiovascular system: S1 & S2 heard, RRR. No JVD, murmurs, rubs, gallops or clicks. No pedal edema. Gastrointestinal system: Abdomen is nondistended, soft and nontender. No organomegaly or masses felt.  Normal bowel sounds heard. Central nervous system: Alert and oriented. No focal neurological deficits. Extremities: Symmetric 5 x 5 power. No edema, no cyanosis, no  clubbing. Skin: No rashes, lesions or ulcers Psychiatry: Judgement and insight appear normal. Mood & affect appropriate.     Data Reviewed: I have personally reviewed following labs and imaging studies  CBC: Recent Labs  Lab 07/30/20 0500 07/31/20 0213 08/02/20 0446 08/03/20 0334 08/03/20 1550 08/05/20 0328  WBC 9.6 9.6 9.2  --   --   --   HGB 9.0* 8.9* 8.5* 8.3* 8.9* 8.3*  HCT 27.4* 26.3* 25.5* 26.1* 27.7* 26.1*  MCV 89.3 88.9 90.4  --   --   --   PLT 413* 406* 330  --   --   --    Basic Metabolic Panel: Recent Labs  Lab 07/30/20 0500 07/31/20 0213 08/02/20 0446 08/03/20 0334 08/04/20 0410 08/05/20 0328  NA 132* 130* 132*  --  132* 131*  K 3.9 4.1 4.0  --  3.8 4.0  CL 97* 97* 97*  --  98 99  CO2 27 27 28   --  27 25  GLUCOSE 77 93 83  --  89 89  BUN 14 17 14   --  12 13  CREATININE 0.51 0.70 0.52 0.52 0.50 0.47  CALCIUM 9.1 9.2 9.4  --  9.0 9.0  MG  --   --  1.7  --  1.6* 1.8  PHOS  --   --  4.4  --  4.4 4.2   GFR: Estimated Creatinine Clearance: 109.4 mL/min (by C-G formula based on SCr of 0.47 mg/dL). Liver Function Tests: Recent Labs  Lab 08/02/20 0446  AST 22  ALT 12  ALKPHOS 96  BILITOT 0.5  PROT 6.9  ALBUMIN 2.3*   No results for input(s): LIPASE, AMYLASE in the last 168 hours. No results for input(s): AMMONIA in the last 168 hours. Coagulation Profile: No results for input(s): INR, PROTIME in the last 168 hours. Cardiac Enzymes: No results for input(s): CKTOTAL, CKMB, CKMBINDEX, TROPONINI in the last 168 hours. BNP (last 3 results) No results for input(s): PROBNP in the last 8760 hours. HbA1C: No results for input(s): HGBA1C in the last 72 hours. CBG: Recent Labs  Lab 08/04/20 1248 08/04/20 1635 08/04/20 2030 08/05/20 0552 08/05/20 1046  GLUCAP 94 109* 90 95 80   Lipid Profile: No results for input(s): CHOL, HDL, LDLCALC, TRIG, CHOLHDL, LDLDIRECT in the last 72 hours. Thyroid Function Tests: No results for input(s): TSH, T4TOTAL,  FREET4, T3FREE, THYROIDAB in the last 72 hours. Anemia Panel: No results for input(s):  VITAMINB12, FOLATE, FERRITIN, TIBC, IRON, RETICCTPCT in the last 72 hours. Sepsis Labs: No results for input(s): PROCALCITON, LATICACIDVEN in the last 168 hours.  No results found for this or any previous visit (from the past 240 hour(s)).   Radiology Studies: ECHOCARDIOGRAM LIMITED  Result Date: 08/03/2020    ECHOCARDIOGRAM LIMITED REPORT   Patient Name:   JAHMIA BERRETT Date of Exam: 08/03/2020 Medical Rec #:  [id number]          Height:       67.5 in Accession #:    [id number]         Weight:       181.7 lb Date of Birth:  [birth year]           BSA:          1.952 m Patient Age:    64 years           BP:           96/74 mmHg Patient Gender: F                  HR:           70 bpm. Exam Location:  Inpatient Procedure: Limited Echo, Cardiac Doppler and Color Doppler Indications:    I36.1 Nonrheumatic tricuspid (valve) insufficiency  History:        Patient has prior history of Echocardiogram examinations, most                 recent 07/13/2020. Endocarditis; Signs/Symptoms:Shortness of                 Breath and Dyspnea. Pregnancy. IVDU. Polysubstance abuse.                 Tricuspid endocarditis. Methadone.  Sonographer:    Roseanna Rainbow RDCS Referring Phys: [place]  1. Left ventricular ejection fraction, by estimation, is 50 to 55%. The left ventricle has low normal function. The left ventricle has no regional wall motion abnormalities. There is mild left ventricular hypertrophy. There is the interventricular septum is flattened in diastole ('D' shaped left ventricle), consistent with right ventricular volume overload.  2. Right ventricular systolic function is normal. The right ventricular size is severely enlarged. There is normal pulmonary artery systolic pressure. The estimated right ventricular systolic pressure is 37.6 mmHg.  3. Right atrial size was severely dilated.  4. The mitral valve  is normal in structure. No evidence of mitral valve regurgitation.  5. The aortic valve is tricuspid. Aortic valve regurgitation is not visualized. No aortic stenosis is present.  6. The inferior vena cava is normal in size with greater than 50% respiratory variability, suggesting right atrial pressure of 3 mmHg.  7. Mobile echodensity likely representing vegetation measuring 1cm x 0.6cm seen on anterior leaflet of tricuspid valve (best seen on images 49-50). Tricuspid valve regurgitation is severe. FINDINGS  Left Ventricle: Left ventricular ejection fraction, by estimation, is 50 to 55%. The left ventricle has low normal function. The left ventricle has no regional wall motion abnormalities. The left ventricular internal cavity size was normal in size. There is mild left ventricular hypertrophy. The interventricular septum is flattened in diastole ('D' shaped left ventricle), consistent with right ventricular volume overload. Right Ventricle: The right ventricular size is severely enlarged. Right ventricular systolic function is normal. There is normal pulmonary artery systolic pressure. The tricuspid regurgitant velocity is 2.75 m/s, and with an assumed right atrial pressure  of 3 mmHg,  the estimated right ventricular systolic pressure is 25.0 mmHg. Right Atrium: Right atrial size was severely dilated. Pericardium: There is no evidence of pericardial effusion. Mitral Valve: The mitral valve is normal in structure. Tricuspid Valve: Mobile echodensity likely representing vegetation measuring 1cm x 0.6cm seen on anterior leaflet of tricuspid valve (best seen on images 49-50). The tricuspid valve is abnormal. Tricuspid valve regurgitation is severe. Aortic Valve: The aortic valve is tricuspid. Aortic valve regurgitation is not visualized. No aortic stenosis is present. Aorta: The aortic root and ascending aorta are structurally normal, with no evidence of dilitation. Venous: The inferior vena cava is normal in size  with greater than 50% respiratory variability, suggesting right atrial pressure of 3 mmHg. LEFT VENTRICLE PLAX 2D LVIDd:         4.40 cm LVIDs:         3.10 cm LV PW:         1.20 cm LV IVS:        1.20 cm  LV Volumes (MOD) LV vol d, MOD A2C: 149.0 ml LV vol d, MOD A4C: 75.2 ml LV vol s, MOD A2C: 57.4 ml LV vol s, MOD A4C: 36.7 ml LV SV MOD A2C:     91.6 ml LV SV MOD A4C:     75.2 ml LV SV MOD BP:      58.0 ml LEFT ATRIUM         Index      RIGHT ATRIUM           Index LA diam:    3.40 cm 1.74 cm/m RA Area:     30.70 cm                                RA Volume:   118.00 ml 60.45 ml/m   AORTA Ao Root diam: 3.10 cm Ao Asc diam:  2.90 cm TRICUSPID VALVE TR Peak grad:   30.2 mmHg TR Vmax:        275.00 cm/s Oswaldo Milian MD Electronically signed by Oswaldo Milian MD Signature Date/Time: 08/03/2020/7:13:46 PM    Final     Scheduled Meds: . sodium chloride   Intravenous Once  . acetaminophen  650 mg Oral Q6H  . Chlorhexidine Gluconate Cloth  6 each Topical Daily  . enoxaparin (LOVENOX) injection  40 mg Subcutaneous Q24H  . feeding supplement  237 mL Oral BID BM  . furosemide  40 mg Oral Daily  . methadone  100 mg Oral Daily  . polyethylene glycol  17 g Oral BID  . prenatal vitamin w/FE, FA  1 tablet Oral Q1200  . senna-docusate  1 tablet Oral BID  . sodium chloride flush  10-40 mL Intracatheter Q12H   Continuous Infusions: . vancomycin 750 mg (08/05/20 1336)     LOS: 23 days    Time spent: 25 mins    Tayvien Kane, MD [facility]   If 7PM-7AM, please contact night-coverage

## 2020-08-06 ENCOUNTER — Inpatient Hospital Stay (HOSPITAL_COMMUNITY): Payer: Medicaid Other

## 2020-08-06 DIAGNOSIS — R7881 Bacteremia: Secondary | ICD-10-CM | POA: Diagnosis not present

## 2020-08-06 DIAGNOSIS — B9562 Methicillin resistant Staphylococcus aureus infection as the cause of diseases classified elsewhere: Secondary | ICD-10-CM | POA: Diagnosis not present

## 2020-08-06 LAB — HEMOGLOBIN AND HEMATOCRIT, BLOOD
HCT: 26 % — ABNORMAL LOW (ref 36.0–46.0)
Hemoglobin: 8.3 g/dL — ABNORMAL LOW (ref 12.0–15.0)

## 2020-08-06 MED ORDER — MELATONIN 3 MG PO TABS
3.0000 mg | ORAL_TABLET | Freq: Every evening | ORAL | Status: DC | PRN
  Administered 2020-08-07 (×2): 3 mg via ORAL
  Filled 2020-08-06 (×2): qty 1

## 2020-08-06 NOTE — Progress Notes (Signed)
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 -Current active IV heroin user, uses about 3 g daily and IV fentanyl, presented to the ED with 3 to 4 weeks history of bilateral lower extremity edema associated with worsening dyspnea on exertion for 4 days, intermittent fever for several weeks and pain with deep inspiration across her right lateral chest and flank. -In the ED she was noted to have fluid overload, blood cultures were positive for MRSA and she was a started on IV vancomycin. -2D echo without obvious vegetation, TEE was deferred and plan is to treat empirically for endocarditis (6 weeks of vancomycin) -Patient was also found to be [redacted] weeks Pregnant.Currently 29+4d weeks pregnancy.She will remain inpatient to complete 6 weeks of IV antibiotics.  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)   Tricuspid regurgitation   Supervision of other high risk pregnancies, third trimester   Preexisting hypertension complicating pregnancy, antepartum   Dental caries   Endocarditis   History of VBAC   Lumbar back pain   Unwanted fertility   Acute on chronic congestive heart failure (HCC)   Cellulitis of right lower extremity   Septic embolism (HCC)    Syphilis   [redacted] weeks gestation of pregnancy   Abdominal pain   Drug use affecting pregnancy in second trimester   Methadone maintenance treatment affecting pregnancy ([place])   Maternal endocarditis complicating pregnancy in third trimester   Pain, dental   29+4 d weeks pregnancy : -Management per primary service, OB  MRSA bacteremia with presumed endocarditis : -Per ID recommendations, continue IV vancomycin -2D echo without obvious vegetations, TEE deferred as empirically treating as endocarditis -Repeat blood cultures so far negative. -On IV vancomycin x 6 weeks, afebrile, 07/31/2020: Complete course of antibiotics. She will remain in hospital to complete 6 weeks of IV antibiotics.  Acute on chronic right-sided heart failure with severe TR, severe pulmonary hypertension -2D echo showed decreased RV systolic function, elevated PA pressure 92, severe TR Echo 3/23: LVEF 50-55%, Mobile echodensity likely representing vegetation measuring 1cm x  0.6 cm seen on anterior leaflet of tricuspid valve. - She is being followed by cardiology/CHF team, transition to Lasix oral on 3/12  - Negative balance of 8.7 L, continue Lasix 40 mg PO daily  Anemia of chronic disease - Baseline hemoglobin 8.5-9.5 - B12 445, folate 7.4, iron 32, ferritin 111 -Continue prenatal vitamins, H&H stable, 8.3  History of IV heroin and fentanyl abuse, chronic pain syndrome -Patient now started on methadone 100 mg daily on 3/15, she has previously tolerated 100 mg of methadone daily -Dilaudid decreased to 0.5- 1 mg every 4 hours as needed for severe/breakthrough pain -HCV antibody+, hep A, hep B negative -Overnight 3/18 heart rate increased to 120s, patient reported that she had vomited her methadone during the day and was having withdrawals at  night, received extra dose of methadone at 2200 -Currently no complaints, feels back to her baseline, denies nausea, vomiting  Pneumonia, possibly septic pulmonary  infarcts -Continue IV vancomycin.  Syphilis -Positive RPR with rash per ID, could be secondary syphilis -Penicillin 2.4MU IM given  Hyponatremia -Stable,132 >131  Back pain -Unable to tolerate MRI, was discontinued, currently on IV antibiotics  Jaw pain, tooth pain, dental caries -Placed on Augmentin, multiple caries, outpatient follow-up [facility]    DVT prophylaxis: Lovenox Code Status: Full code. Family Communication: No family at bed side. Disposition Plan:  Status is: Inpatient  Remains inpatient appropriate because:Inpatient level of care appropriate due to severity of illness   Dispo: The patient is from: Home              Anticipated d/c is to: Home              Patient currently is not medically stable to d/c.   Difficult to place patient No   Consultants:   Cardiology, infectious diseases, cardiothoracic surgery  Procedures:   Antimicrobials:  Anti-infectives (From admission, onward)   Start     Dose/Rate Route Frequency Ordered Stop   07/22/20 1045  amoxicillin-clavulanate (AUGMENTIN) 875-125 MG per tablet 1 tablet        1 tablet Oral Every 12 hours 07/22/20 0952 07/31/20 2023   07/18/20 0830  vancomycin (VANCOCIN) IVPB 750 mg/150 ml premix        750 mg 150 mL/hr over 60 Minutes Intravenous Every 8 hours 07/18/20 0750     07/16/20 0400  Vancomycin (VANCOCIN) 1,250 mg in sodium chloride 0.9 % 250 mL IVPB  Status:  Discontinued        1,250 mg 166.7 mL/hr over 90 Minutes Intravenous Every 8 hours 07/15/20 1944 07/18/20 0749   07/15/20 1715  penicillin g benzathine (BICILLIN LA) 1200000 UNIT/2ML injection 2.4 Million Units        2.4 Million Units Intramuscular  Once 07/15/20 1621 07/15/20 1747   07/14/20 0300  vancomycin (VANCOCIN) IVPB 1000 mg/200 mL premix        1,000 mg 200 mL/hr over 60 Minutes Intravenous Every 8 hours 07/13/20 1806 07/15/20 2059   07/13/20 1730  Vancomycin (VANCOCIN) 1,500 mg in sodium chloride 0.9 %  500 mL IVPB        1,500 mg 250 mL/hr over 120 Minutes Intravenous  Once 07/13/20 1709 07/13/20 2116   07/13/20 1730  ceFEPIme (MAXIPIME) 2 g in sodium chloride 0.9 % 100 mL IVPB  Status:  Discontinued        2 g 200 mL/hr over 30 Minutes Intravenous Every 8 hours 07/13/20 1709 07/14/20 1050   07/13/20 1315  cefTRIAXone (ROCEPHIN) 1 g in sodium chloride 0.9 % 100 mL IVPB        1 g 200 mL/hr over 30 Minutes Intravenous  Once 07/13/20 1306 07/13/20 1538   07/13/20 1315  azithromycin (ZITHROMAX) 500 mg in sodium chloride 0.9 % 250 mL IVPB        500 mg 250 mL/hr over 60 Minutes Intravenous  Once 07/13/20 1306 07/13/20 1644      Subjective: Patient was seen and examined at bedside.  Overnight events noted.   She is lying comfortably in the bed. She asks for sleeping medication at night. Patient denies any shortness of breath and chest pain.   She reports fetal movements. MRI was cancelled, states she has claustrophobia, asks if she gets something before MRI.  Objective:  Vitals:   08/05/20 1817 08/06/20 0018 08/06/20 0524 08/06/20 1201  BP: 128/74 104/66 118/61 103/78  Pulse: 81 79 89 85  Resp: 16 18 18 18   Temp: 97.8 F (36.6 C) 97.6 F (36.4 C) 98 F (36.7 C) 97.6 F (36.4 C)  TempSrc: Oral Oral Oral Oral  SpO2: 98% 97% 96% 98%  Weight:   82.8 kg   Height:        Intake/Output Summary (Last 24 hours) at 08/06/2020 1340 Last data filed at 08/05/2020 1752 Gross per 24 hour  Intake 240 ml  Output -  Net 240 ml   Filed Weights   08/04/20 0525 08/05/20 0614 08/06/20 0524  Weight: 82.1 kg 82.4 kg 82.8 kg    Examination:  General exam: Appears calm and comfortable, not in any acute distress Respiratory system: Clear to auscultation. Respiratory effort normal. Cardiovascular system: S1 & S2 heard, RRR. No JVD, murmurs, rubs, gallops or clicks. No pedal edema. Gastrointestinal system: Abdomen is nondistended, soft and nontender. No organomegaly or masses felt.  Normal bowel  sounds heard. Central nervous system: Alert and oriented. No focal neurological deficits. Extremities: Symmetric 5 x 5 power. No edema, no cyanosis, no clubbing. Skin: No rashes, lesions or ulcers Psychiatry: Judgement and insight appear normal. Mood & affect appropriate.     Data Reviewed: I have personally reviewed following labs and imaging studies  CBC: Recent Labs  Lab 07/31/20 0213 08/02/20 0446 08/03/20 0334 08/03/20 1550 08/05/20 0328 08/06/20 0327  WBC 9.6 9.2  --   --   --   --   HGB 8.9* 8.5* 8.3* 8.9* 8.3* 8.3*  HCT 26.3* 25.5* 26.1* 27.7* 26.1* 26.0*  MCV 88.9 90.4  --   --   --   --   PLT 406* 330  --   --   --   --    Basic Metabolic Panel: Recent Labs  Lab 07/31/20 0213 08/02/20 0446 08/03/20 0334 08/04/20 0410 08/05/20 0328  NA 130* 132*  --  132* 131*  K 4.1 4.0  --  3.8 4.0  CL 97* 97*  --  98 99  CO2 27 28  --  27 25  GLUCOSE 93 83  --  89 89  BUN 17 14  --  12 13  CREATININE 0.70 0.52 0.52 0.50 0.47  CALCIUM 9.2 9.4  --  9.0 9.0  MG  --  1.7  --  1.6* 1.8  PHOS  --  4.4  --  4.4 4.2   GFR: Estimated Creatinine Clearance: 109.7 mL/min (by C-G formula based on SCr of 0.47 mg/dL). Liver Function Tests: Recent Labs  Lab 08/02/20 0446  AST 22  ALT 12  ALKPHOS 96  BILITOT 0.5  PROT 6.9  ALBUMIN 2.3*   No results for input(s): LIPASE, AMYLASE in the last 168 hours. No results for input(s): AMMONIA in the last 168 hours. Coagulation Profile: No results for input(s): INR, PROTIME in the last 168 hours. Cardiac Enzymes: No results for input(s): CKTOTAL, CKMB, CKMBINDEX, TROPONINI in the last 168 hours. BNP (last 3 results) No results for input(s): PROBNP in the last 8760 hours. HbA1C: No results for input(s): HGBA1C in the last 72 hours. CBG: Recent Labs  Lab 08/04/20 1248 08/04/20 1635 08/04/20 2030 08/05/20 0552 08/05/20 1046  GLUCAP 94 109* 90 95 80   Lipid Profile: No results for input(s): CHOL, HDL, LDLCALC, TRIG, CHOLHDL,  LDLDIRECT in the last 72 hours. Thyroid Function Tests: No results for input(s): TSH,  T4TOTAL, FREET4, T3FREE, THYROIDAB in the last 72 hours. Anemia Panel: No results for input(s): VITAMINB12, FOLATE, FERRITIN, TIBC, IRON, RETICCTPCT in the last 72 hours. Sepsis Labs: No results for input(s): PROCALCITON, LATICACIDVEN in the last 168 hours.  No results found for this or any previous visit (from the past 240 hour(s)).   Radiology Studies: No results found.  Scheduled Meds: . sodium chloride   Intravenous Once  . acetaminophen  650 mg Oral Q6H  . Chlorhexidine Gluconate Cloth  6 each Topical Daily  . enoxaparin (LOVENOX) injection  40 mg Subcutaneous Q24H  . feeding supplement  237 mL Oral BID BM  . furosemide  40 mg Oral Daily  . methadone  100 mg Oral Daily  . polyethylene glycol  17 g Oral BID  . prenatal vitamin w/FE, FA  1 tablet Oral Q1200  . senna-docusate  1 tablet Oral BID  . sodium chloride flush  10-40 mL Intracatheter Q12H   Continuous Infusions: . vancomycin 750 mg (08/06/20 1304)     LOS: 24 days    Time spent: 25 mins    PARDEEP KUMAR, MD [facility]   If 7PM-7AM, please contact night-coverage

## 2020-08-06 NOTE — Progress Notes (Signed)
NST is reactive. Pt denies vaginal bleeding or leaking of fluid. Denies uc's. Pt  Laughing and talking during fetal monitoring.

## 2020-08-07 DIAGNOSIS — R7881 Bacteremia: Secondary | ICD-10-CM | POA: Diagnosis not present

## 2020-08-07 DIAGNOSIS — B9562 Methicillin resistant Staphylococcus aureus infection as the cause of diseases classified elsewhere: Secondary | ICD-10-CM | POA: Diagnosis not present

## 2020-08-07 LAB — BASIC METABOLIC PANEL
Anion gap: 8 (ref 5–15)
BUN: 11 mg/dL (ref 6–20)
CO2: 24 mmol/L (ref 22–32)
Calcium: 8.9 mg/dL (ref 8.9–10.3)
Chloride: 100 mmol/L (ref 98–111)
Creatinine, Ser: 0.56 mg/dL (ref 0.44–1.00)
GFR, Estimated: >60 mL/min (ref >60)
Glucose, Bld: 110 mg/dL — ABNORMAL HIGH (ref 70–99)
Potassium: 3.7 mmol/L (ref 3.5–5.1)
Sodium: 132 mmol/L — ABNORMAL LOW (ref 135–145)

## 2020-08-07 LAB — HEMOGLOBIN AND HEMATOCRIT, BLOOD
HCT: 25.5 % — ABNORMAL LOW (ref 36.0–46.0)
Hemoglobin: 8.2 g/dL — ABNORMAL LOW (ref 12.0–15.0)

## 2020-08-07 NOTE — Progress Notes (Signed)
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 -Current active IV heroin user, uses about 3 g daily and IV fentanyl, presented to the ED with 3 to 4 weeks history of bilateral lower extremity edema associated with worsening dyspnea on exertion for 4 days, intermittent fever for several weeks and pain with deep inspiration across her right lateral chest and flank. -In the ED she was noted to have fluid overload, blood cultures were positive for MRSA and she was a started on IV vancomycin. -2D echo without obvious vegetation, TEE was deferred and plan is to treat empirically for endocarditis (6 weeks of vancomycin) -Patient was also found to be [redacted] weeks Pregnant.Currently 29+5d weeks pregnancy.She will remain inpatient to complete 6 weeks of IV antibiotics.   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)   Tricuspid regurgitation   Supervision of other high risk pregnancies, third trimester   Preexisting hypertension complicating pregnancy, antepartum   Dental caries   Endocarditis   History of VBAC   Lumbar back pain   Unwanted fertility   Acute on chronic congestive heart failure (HCC)   Cellulitis of right lower extremity   Septic embolism (HCC)    Syphilis   [redacted] weeks gestation of pregnancy   Abdominal pain   Drug use affecting pregnancy in second trimester   Methadone maintenance treatment affecting pregnancy ([place])   Maternal endocarditis complicating pregnancy in third trimester   Pain, dental   29+5 d weeks pregnancy : -Management per primary service, OB  MRSA bacteremia with presumed endocarditis : -Per ID recommendations, continue IV vancomycin. -2D echo without obvious vegetations, TEE deferred as empirically treating this as endocarditis. -Repeat blood cultures so far negative. -On IV vancomycin x 6 weeks, remains afebrile. -She will remain in hospital to complete 6 weeks of IV antibiotics.  Acute on chronic right-sided heart failure with severe TR, severe pulmonary hypertension -2D echo showed decreased RV systolic function, elevated PA pressure 92, severe TR -Echo 3/23: LVEF 50-55%, Mobile echodensity likely representing vegetation measuring 1cm x  0.6 cm seen on anterior leaflet of tricuspid valve. -She is being followed by cardiology/CHF team, transition to Lasix oral on 3/12  -Negative balance of 8.7 L, continue Lasix 40 mg PO daily.  Anemia of chronic disease - Baseline hemoglobin 8.5-9.5 - B12 445, folate 7.4, iron 32, ferritin 111 - Continue prenatal vitamins, H&H stable, 8.3  History of IV heroin and fentanyl abuse, chronic pain syndrome -Patient now started on methadone 100 mg daily on 3/15, she has previously tolerated 100 mg of methadone daily -Dilaudid decreased to 0.5- 1 mg every 4 hours as needed for severe/breakthrough pain -HCV antibody+, hep A, hep B negative -Overnight 3/18 heart rate increased to 120s, patient reported that she had vomited her methadone during the day and was having withdrawals at night, received extra  dose of methadone at 2200 -Currently no complaints, feels back to her baseline, denies nausea, vomiting  Pneumonia, possibly septic pulmonary infarcts -Continue IV  vancomycin.  Syphilis -Positive RPR with rash per ID, could be secondary syphilis -Penicillin 2.4MU IM given  Hyponatremia -Stable,132 >131>132  Back pain -Unable to tolerate MRI, was discontinued, currently on IV antibiotics. - MRI [place] [facility] ordered, will give low dose ativan before MRI  Jaw pain, tooth pain, dental caries -Placed on Augmentin, multiple caries, outpatient follow-up [facility]    DVT prophylaxis: Lovenox Code Status: Full code. Family Communication: No family at bed side. Disposition Plan:  Status is: Inpatient  Remains inpatient appropriate because:Inpatient level of care appropriate due to severity of illness   Dispo: The patient is from: Home              Anticipated d/c is to: Home              Patient currently is not medically stable to d/c.   Difficult to place patient No   She will remain in the hospital to complete IV antibiotics for 6 weeks.  Consultants:   Cardiology, infectious diseases, cardiothoracic surgery  Procedures:   Antimicrobials:  Anti-infectives (From admission, onward)   Start     Dose/Rate Route Frequency Ordered Stop   07/22/20 1045  amoxicillin-clavulanate (AUGMENTIN) 875-125 MG per tablet 1 tablet        1 tablet Oral Every 12 hours 07/22/20 0952 07/31/20 2023   07/18/20 0830  vancomycin (VANCOCIN) IVPB 750 mg/150 ml premix        750 mg 150 mL/hr over 60 Minutes Intravenous Every 8 hours 07/18/20 0750     07/16/20 0400  Vancomycin (VANCOCIN) 1,250 mg in sodium chloride 0.9 % 250 mL IVPB  Status:  Discontinued        1,250 mg 166.7 mL/hr over 90 Minutes Intravenous Every 8 hours 07/15/20 1944 07/18/20 0749   07/15/20 1715  penicillin g benzathine (BICILLIN LA) 1200000 UNIT/2ML injection 2.4 Million Units        2.4 Million Units Intramuscular  Once 07/15/20 1621 07/15/20 1747   07/14/20 0300  vancomycin (VANCOCIN) IVPB 1000 mg/200 mL premix        1,000 mg 200 mL/hr over 60 Minutes  Intravenous Every 8 hours 07/13/20 1806 07/15/20 2059   07/13/20 1730  Vancomycin (VANCOCIN) 1,500 mg in sodium chloride 0.9 % 500 mL IVPB        1,500 mg 250 mL/hr over 120 Minutes Intravenous  Once 07/13/20 1709 07/13/20 2116   07/13/20 1730  ceFEPIme (MAXIPIME) 2 g in sodium chloride 0.9 % 100 mL IVPB  Status:  Discontinued        2 g 200 mL/hr over 30 Minutes Intravenous Every 8 hours 07/13/20 1709 07/14/20 1050   07/13/20 1315  cefTRIAXone (ROCEPHIN) 1 g in sodium chloride 0.9 % 100 mL IVPB        1 g 200 mL/hr over 30 Minutes Intravenous  Once 07/13/20 1306 07/13/20 1538   07/13/20 1315  azithromycin (ZITHROMAX) 500 mg in sodium chloride 0.9 % 250 mL IVPB        500 mg 250 mL/hr over 60 Minutes Intravenous  Once 07/13/20 1306 07/13/20 1644      Subjective: Patient was seen and examined at bedside.  Overnight events noted.   She is lying comfortably in the bed. She asks for sleeping medication at night. Patient denies any shortness of breath and chest  pain.   She reports fetal movements.  Objective: Vitals:   08/06/20 1201 08/07/20 0038 08/07/20 0518 08/07/20 1123  BP: 103/78 108/73 101/74 123/74  Pulse: 85 87 81 92  Resp: 18 18 18 16   Temp: 97.6 F (36.4 C) 97.8 F (36.6 C) 97.7 F (36.5 C) (!) 97.5 F (36.4 C)  TempSrc: Oral Oral Oral Oral  SpO2: 98% 97% 98% 96%  Weight:   82.9 kg   Height:       No intake or output data in the 24 hours ending 08/07/20 1409 Filed Weights   08/05/20 0614 08/06/20 0524 08/07/20 0518  Weight: 82.4 kg 82.8 kg 82.9 kg    Examination:  General exam: Appears calm and comfortable, not in any acute distress Respiratory system: Clear to auscultation. Respiratory effort normal. Cardiovascular system: S1 & S2 heard, RRR. No JVD, murmurs, rubs, gallops or clicks. No pedal edema. Gastrointestinal system: Abdomen is nondistended, soft and nontender. No organomegaly or masses felt.  Normal bowel sounds heard. Central nervous system: Alert and  oriented. No focal neurological deficits. Extremities: Symmetric 5 x 5 power. No edema, no cyanosis, no clubbing. Skin: No rashes, lesions or ulcers Psychiatry: Judgement and insight appear normal. Mood & affect appropriate.     Data Reviewed: I have personally reviewed following labs and imaging studies  CBC: Recent Labs  Lab 08/02/20 0446 08/03/20 0334 08/03/20 1550 08/05/20 0328 08/06/20 0327 08/07/20 0329  WBC 9.2  --   --   --   --   --   HGB 8.5* 8.3* 8.9* 8.3* 8.3* 8.2*  HCT 25.5* 26.1* 27.7* 26.1* 26.0* 25.5*  MCV 90.4  --   --   --   --   --   PLT 330  --   --   --   --   --    Basic Metabolic Panel: Recent Labs  Lab 08/02/20 0446 08/03/20 0334 08/04/20 0410 08/05/20 0328 08/07/20 0329  NA 132*  --  132* 131* 132*  K 4.0  --  3.8 4.0 3.7  CL 97*  --  98 99 100  CO2 28  --  27 25 24   GLUCOSE 83  --  89 89 110*  BUN 14  --  12 13 11   CREATININE 0.52 0.52 0.50 0.47 0.56  CALCIUM 9.4  --  9.0 9.0 8.9  MG 1.7  --  1.6* 1.8  --   PHOS 4.4  --  4.4 4.2  --    GFR: Estimated Creatinine Clearance: 109.7 mL/min (by C-G formula based on SCr of 0.56 mg/dL). Liver Function Tests: Recent Labs  Lab 08/02/20 0446  AST 22  ALT 12  ALKPHOS 96  BILITOT 0.5  PROT 6.9  ALBUMIN 2.3*   No results for input(s): LIPASE, AMYLASE in the last 168 hours. No results for input(s): AMMONIA in the last 168 hours. Coagulation Profile: No results for input(s): INR, PROTIME in the last 168 hours. Cardiac Enzymes: No results for input(s): CKTOTAL, CKMB, CKMBINDEX, TROPONINI in the last 168 hours. BNP (last 3 results) No results for input(s): PROBNP in the last 8760 hours. HbA1C: No results for input(s): HGBA1C in the last 72 hours. CBG: Recent Labs  Lab 08/04/20 1248 08/04/20 1635 08/04/20 2030 08/05/20 0552 08/05/20 1046  GLUCAP 94 109* 90 95 80   Lipid Profile: No results for input(s): CHOL, HDL, LDLCALC, TRIG, CHOLHDL, LDLDIRECT in the last 72 hours. Thyroid  Function Tests: No results for input(s): TSH, T4TOTAL, FREET4, T3FREE, THYROIDAB  in the last 72 hours. Anemia Panel: No results for input(s): VITAMINB12, FOLATE, FERRITIN, TIBC, IRON, RETICCTPCT in the last 72 hours. Sepsis Labs: No results for input(s): PROCALCITON, LATICACIDVEN in the last 168 hours.  No results found for this or any previous visit (from the past 240 hour(s)).   Radiology Studies: No results found.  Scheduled Meds: . sodium chloride   Intravenous Once  . acetaminophen  650 mg Oral Q6H  . Chlorhexidine Gluconate Cloth  6 each Topical Daily  . enoxaparin (LOVENOX) injection  40 mg Subcutaneous Q24H  . feeding supplement  237 mL Oral BID BM  . furosemide  40 mg Oral Daily  . methadone  100 mg Oral Daily  . polyethylene glycol  17 g Oral BID  . prenatal vitamin w/FE, FA  1 tablet Oral Q1200  . senna-docusate  1 tablet Oral BID  . sodium chloride flush  10-40 mL Intracatheter Q12H   Continuous Infusions: . vancomycin 750 mg (08/07/20 1320)     LOS: 25 days    Time spent: 25 mins    PARDEEP KUMAR, MD [facility]   If 7PM-7AM, please contact night-coverage

## 2020-08-07 NOTE — Progress Notes (Addendum)
Pharmacy Antibiotic Note  Kelly [name] is a 35 y.o. female with MRSA bacteremia  And TV endocarditis. 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  Pharmacy has been consulted for vancomycin. Patient is pregnant with EGA of [redacted] weeks.  Scr remains stable at 0.56 (CrCl >100 mL/min). Remains afebrile. Planning 6 weeks of inpatient IV antibiotics.Patient clinically improving. Most recent vancomycin levels on 3/24 appropriate.   Plan: Continue vancomycin to 750 mg IV q 8 hrs Monitor C&S, clinical status, renal function, VT as needed Plan for levels as needed, most likely early next week    Temp (24hrs), Avg:97.7 F (36.5 C), Min:97.6 F (36.4 C), Max:97.8 F (36.6 C)  Recent Labs  Lab 08/02/20 0446 08/03/20 0334 08/04/20 0409 08/04/20 0410 08/04/20 1018 08/05/20 0328 08/07/20 0329  WBC 9.2  --   --   --   --   --   --   CREATININE 0.52 0.52  --  0.50  --  0.47 0.56  VANCOTROUGH  --   --   --   --  11*  --   --   VANCOPEAK  --   --  28*  --   --   --   --     Estimated Creatinine Clearance: 109.7 mL/min (by C-G formula based on SCr of 0.56 mg/dL).    Allergies  Allergen Reactions  . Hydrocodone Itching  . Morphine And Related Nausea And Vomiting    Antimicrobials this admission: Azith 3/2 x1 CTX 3/2 x1 Vancomycin 3/2> Cefepime 3/2> 3/3  Cultures: 3/2 urine - ng 3/2 Bld - GPC - MRSA 3/2 MRSA PCR pos 3/4 BCx x 2 > Neg  Thank you for allowing pharmacy to be a part of this patient's care.  Cephus Slater, PharmD, MBA Pharmacy Resident [phone] 08/07/2020 9:50 AM

## 2020-08-07 NOTE — Progress Notes (Signed)
NST reactive. ! uc with ui noted. Pt denied feeling cramping or abd pain. She says that earlier today she felt abd cramps and tightening. She says she did have a bowel movement. She denies vaginal bleeding or leaking of fluid. I told her to let the staff here know if she felt like she was feeling contractions and to call [facility] at [phone] so we could come and monitor her. Dr. Roselie Awkward notified.

## 2020-08-07 NOTE — Progress Notes (Signed)
Patient did not receive 0500 Vanco dose. Pharmacy notified and advised to just hang the following dose at scheduled time (1300).

## 2020-08-08 ENCOUNTER — Inpatient Hospital Stay (HOSPITAL_COMMUNITY): Payer: Medicaid Other

## 2020-08-08 DIAGNOSIS — I330 Acute and subacute infective endocarditis: Secondary | ICD-10-CM | POA: Diagnosis not present

## 2020-08-08 DIAGNOSIS — I2690 Septic pulmonary embolism without acute cor pulmonale: Secondary | ICD-10-CM

## 2020-08-08 DIAGNOSIS — O99413 Diseases of the circulatory system complicating pregnancy, third trimester: Secondary | ICD-10-CM | POA: Diagnosis not present

## 2020-08-08 DIAGNOSIS — B9562 Methicillin resistant Staphylococcus aureus infection as the cause of diseases classified elsewhere: Secondary | ICD-10-CM | POA: Diagnosis not present

## 2020-08-08 DIAGNOSIS — R7881 Bacteremia: Secondary | ICD-10-CM | POA: Diagnosis not present

## 2020-08-08 MED ORDER — ALPRAZOLAM 0.5 MG PO TABS
0.5000 mg | ORAL_TABLET | Freq: Once | ORAL | Status: AC
  Administered 2020-08-08: 0.5 mg via ORAL
  Filled 2020-08-08: qty 1

## 2020-08-08 MED ORDER — ALTEPLASE 2 MG IJ SOLR
2.0000 mg | Freq: Once | INTRAMUSCULAR | Status: AC
  Administered 2020-08-08: 2 mg
  Filled 2020-08-08: qty 2

## 2020-08-08 MED ORDER — METHADONE HCL 10 MG PO TABS
110.0000 mg | ORAL_TABLET | Freq: Every day | ORAL | Status: DC
  Administered 2020-08-09 – 2020-08-12 (×4): 110 mg via ORAL
  Filled 2020-08-08 (×4): qty 11

## 2020-08-08 MED ORDER — ZOLPIDEM TARTRATE 5 MG PO TABS
5.0000 mg | ORAL_TABLET | Freq: Every evening | ORAL | Status: DC | PRN
  Administered 2020-08-08 – 2020-08-24 (×16): 5 mg via ORAL
  Filled 2020-08-08 (×17): qty 1

## 2020-08-08 NOTE — Progress Notes (Signed)
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)   Tricuspid regurgitation   Supervision of other high risk pregnancies, third trimester   Preexisting hypertension complicating pregnancy, antepartum   Dental caries   Endocarditis   History of VBAC   Lumbar back pain   Unwanted fertility   Acute on chronic congestive heart failure (HCC)   Cellulitis of right lower extremity   Septic embolism (HCC)   Syphilis   [redacted] weeks gestation of pregnancy   Abdominal pain   Drug use affecting pregnancy in second trimester   Methadone maintenance treatment affecting pregnancy ([place])   Maternal endocarditis complicating pregnancy in third trimester   Pain, dental   Antibiotics: Vancomycin 3/2- c                     Total days of antibiotics 27   Lines/Tubes: RT arm PICC line   Interval Events: afebrile. Walking in the corridor.    Assessment MRSA Bacteremia/TV Endocarditis c/b septic pulmonary emboli  Severe TVR- Cardiology following  Lumbar back pain - ambulatory and no signs of neurologic compromise  Pregnancy ( 29+ WOG) in an IVDU - OB following  HCV ab reactive- HCV RNA not detected   Recommendations Continue vancomycin , pharmacy to dose as planned previously with end date 4/14 Will follow up MRI spine. Patient is asking for analgesics to have the MRI done.  Monitor CBC and BMP while  on IV antibiotics Fu ESR and CRP  Will make further recommendations based on MRI spine  Rest of the management as per the primary team. Thank you for the consult. Please page with pertinent questions or concerns. ______________________________________________________________________ Subjective patient seen and examined at the bedside. Walking in the corridor.  Denies fevers, chills  Denies N/V/D' Back pain is stable   Vitals BP 102/60 (BP Location: Left Arm)   Pulse 88   Temp (!) 97.4 F (36.3 C) (Oral)   Resp 16   Ht 5' 7.5" (1.715 m)   Wt 84.1 kg   SpO2 95%   BMI 28.61 kg/m     Physical Exam Constitutional:  not in acute distress, ambulatory     Comments:   Cardiovascular:     Rate and Rhythm: Normal rate and regular rhythm.     Heart sounds: Systolic murmur   Pulmonary:     Effort: Pulmonary effort is normal.     Comments:   Abdominal:     Palpations: Abdomen is Gravid     Tenderness:   Musculoskeletal:        General: No swelling or tenderness. No vertebral tenderness  Skin:    Comments: no obvious rashes   Neurological:     General: No focal deficit present.   Psychiatric:        Mood and Affect: Mood normal.   Pertinent Microbiology Results for orders placed or performed during the hospital encounter of 07/13/20  Resp Panel by RT-PCR (Flu A&B, Covid) Nasopharyngeal Swab  Status: None   Collection Time: 07/13/20 11:21 AM   Specimen: Nasopharyngeal Swab; Nasopharyngeal(NP) swabs in vial transport medium  Result Value Ref Range Status   SARS Coronavirus 2 by RT PCR NEGATIVE NEGATIVE Final    Comment: (NOTE) SARS-CoV-2 target nucleic acids are NOT DETECTED.  The SARS-CoV-2 RNA is generally detectable in upper respiratory specimens during the acute phase of infection. The lowest concentration of SARS-CoV-2 viral copies this assay can detect is 138 copies/mL. A negative result does not preclude SARS-Cov-2 infection and should not be used as the  sole basis for treatment or other patient management decisions. A negative result may occur with  improper specimen collection/handling, submission of specimen other than nasopharyngeal swab, presence of viral mutation(s) within the areas targeted by this assay, and inadequate number of viral copies(<138 copies/mL). A negative result must be combined with clinical observations, patient history, and epidemiological information. The expected result is Negative.  Fact Sheet for Patients:  EntrepreneurPulse.com.au  Fact Sheet for Healthcare Providers:  IncredibleEmployment.be  This test is no t yet approved or cleared by the [place] FDA and  has been authorized for detection and/or diagnosis of SARS-CoV-2 by FDA under an Emergency Use Authorization (EUA). This EUA will remain  in effect (meaning this test can be used) for the duration of the COVID-19 declaration under Section 564(b)(1) of the Act, 21 U.S.C.section 360bbb-3(b)(1), unless the authorization is terminated  or revoked sooner.       Influenza A by PCR NEGATIVE NEGATIVE Final   Influenza B by PCR NEGATIVE NEGATIVE Final    Comment: (NOTE) The Xpert Xpress SARS-CoV-2/FLU/RSV plus assay is intended as an aid in the diagnosis of influenza from Nasopharyngeal swab specimens and should not be used as a sole basis for treatment. Nasal washings and aspirates are unacceptable for Xpert Xpress SARS-CoV-2/FLU/RSV testing.  Fact Sheet for Patients: EntrepreneurPulse.com.au  Fact Sheet for Healthcare Providers: IncredibleEmployment.be  This test is not yet approved or cleared by the [place] FDA and has been authorized for detection and/or diagnosis of SARS-CoV-2 by FDA under an Emergency Use Authorization (EUA). This EUA will remain in effect (meaning this test can be used) for the duration of the COVID-19 declaration under Section 564(b)(1) of the  Act, 21 U.S.C. section 360bbb-3(b)(1), unless the authorization is terminated or revoked.  Performed at [facility], [place] [address], [place], [place] [address]   Culture, blood (routine x 2)     Status: Abnormal   Collection Time: 07/13/20 12:20 PM   Specimen: BLOOD  Result Value Ref Range Status   Specimen Description BLOOD SITE NOT SPECIFIED  Final   Special Requests   Final    BOTTLES DRAWN AEROBIC AND ANAEROBIC Blood Culture adequate volume   Culture  Setup Time   Final    GRAM POSITIVE COCCI IN CHAINS IN BOTH AEROBIC AND ANAEROBIC BOTTLES Organism ID to follow CRITICAL RESULT CALLED TO, READ BACK BY AND VERIFIED WITH: Performed at [facility], [place] [address] [place], [place] [address]    Culture METHICILLIN RESISTANT STAPHYLOCOCCUS AUREUS (A)  Final   Report Status 07/16/2020 FINAL  Final   Organism ID, Bacteria METHICILLIN RESISTANT STAPHYLOCOCCUS AUREUS  Final      Susceptibility   Methicillin resistant staphylococcus aureus - MIC*    CIPROFLOXACIN <=0.5 SENSITIVE Sensitive     ERYTHROMYCIN >=8 RESISTANT Resistant     GENTAMICIN <=0.5 SENSITIVE Sensitive     OXACILLIN >=4 RESISTANT Resistant     TETRACYCLINE <=  1 SENSITIVE Sensitive     VANCOMYCIN <=0.5 SENSITIVE Sensitive     TRIMETH/SULFA <=10 SENSITIVE Sensitive     CLINDAMYCIN <=0.25 SENSITIVE Sensitive     RIFAMPIN <=0.5 SENSITIVE Sensitive     Inducible Clindamycin NEGATIVE Sensitive     * METHICILLIN RESISTANT STAPHYLOCOCCUS AUREUS  Blood Culture ID Panel (Reflexed)     Status: Abnormal   Collection Time: 07/13/20 12:20 PM  Result Value Ref Range Status   Enterococcus faecalis NOT DETECTED NOT DETECTED Final   Enterococcus Faecium NOT DETECTED NOT DETECTED Final   Listeria monocytogenes NOT DETECTED NOT DETECTED Final   Staphylococcus species DETECTED (A) NOT DETECTED Final    Comment: CRITICAL RESULT CALLED TO, READ BACK BY AND VERIFIED WITH: PHARMD AMEND CAREN BY MESSAN H. AT 0120 ON  07/14/2020    Staphylococcus aureus (BCID) DETECTED (A) NOT DETECTED Final    Comment: Methicillin (oxacillin)-resistant Staphylococcus aureus (MRSA). MRSA is predictably resistant to beta-lactam antibiotics (except ceftaroline). Preferred therapy is vancomycin unless clinically contraindicated. Patient requires contact precautions if  hospitalized. CRITICAL RESULT CALLED TO, READ BACK BY AND VERIFIED WITH: PHARMD AMEND CAREN BY MESSAN H. AT 0120 ON 07/14/2020    Staphylococcus epidermidis NOT DETECTED NOT DETECTED Final   Staphylococcus lugdunensis NOT DETECTED NOT DETECTED Final   Streptococcus species NOT DETECTED NOT DETECTED Final   Streptococcus agalactiae NOT DETECTED NOT DETECTED Final   Streptococcus pneumoniae NOT DETECTED NOT DETECTED Final   Streptococcus pyogenes NOT DETECTED NOT DETECTED Final   A.calcoaceticus-baumannii NOT DETECTED NOT DETECTED Final   Bacteroides fragilis NOT DETECTED NOT DETECTED Final   Enterobacterales NOT DETECTED NOT DETECTED Final   Enterobacter cloacae complex NOT DETECTED NOT DETECTED Final   Escherichia coli NOT DETECTED NOT DETECTED Final   Klebsiella aerogenes NOT DETECTED NOT DETECTED Final   Klebsiella oxytoca NOT DETECTED NOT DETECTED Final   Klebsiella pneumoniae NOT DETECTED NOT DETECTED Final   Proteus species NOT DETECTED NOT DETECTED Final   Salmonella species NOT DETECTED NOT DETECTED Final   Serratia marcescens NOT DETECTED NOT DETECTED Final   Haemophilus influenzae NOT DETECTED NOT DETECTED Final   Neisseria meningitidis NOT DETECTED NOT DETECTED Final   Pseudomonas aeruginosa NOT DETECTED NOT DETECTED Final   Stenotrophomonas maltophilia NOT DETECTED NOT DETECTED Final   Candida albicans NOT DETECTED NOT DETECTED Final   Candida auris NOT DETECTED NOT DETECTED Final   Candida glabrata NOT DETECTED NOT DETECTED Final   Candida krusei NOT DETECTED NOT DETECTED Final   Candida parapsilosis NOT DETECTED NOT DETECTED Final   Candida  tropicalis NOT DETECTED NOT DETECTED Final   Cryptococcus neoformans/gattii NOT DETECTED NOT DETECTED Final   Meth resistant mecA/C and MREJ DETECTED (A) NOT DETECTED Final    Comment: CRITICAL RESULT CALLED TO, READ BACK BY AND VERIFIED WITH: PHARMD AMEND CAREN BY MESSAN H. AT 0120 ON 07/14/2020 Performed at [facility], [address] [place], [place] [address]   Culture, blood (routine x 2)     Status: Abnormal   Collection Time: 07/13/20 12:21 PM   Specimen: BLOOD  Result Value Ref Range Status   Specimen Description BLOOD SITE NOT SPECIFIED  Final   Special Requests   Final    BOTTLES DRAWN AEROBIC AND ANAEROBIC Blood Culture adequate volume   Culture  Setup Time   Final    GRAM POSITIVE COCCI IN BOTH AEROBIC AND ANAEROBIC BOTTLES CRITICAL VALUE NOTED.  VALUE IS CONSISTENT WITH PREVIOUSLY REPORTED AND CALLED VALUE.    Culture (  A)  Final    STAPHYLOCOCCUS AUREUS SUSCEPTIBILITIES PERFORMED ON PREVIOUS CULTURE WITHIN THE LAST 5 DAYS. Performed at [facility], [place] [address] [place], [place] [address]    Report Status 07/16/2020 FINAL  Final  MRSA PCR Screening     Status: Abnormal   Collection Time: 07/13/20  8:37 PM   Specimen: Nasal Mucosa; Nasopharyngeal  Result Value Ref Range Status   MRSA by PCR POSITIVE (A) NEGATIVE Final    Comment:        The GeneXpert MRSA Assay (FDA approved for NASAL specimens only), is one component of a comprehensive MRSA colonization surveillance program. It is not intended to diagnose MRSA infection nor to guide or monitor treatment for MRSA infections. RESULT CALLED TO, READ BACK BY AND VERIFIED WITH: TOLER,M RN 07/13/2020 AT 2319 SKEEN,P Performed at [facility], [place] [address] [place], [place] [address]   Urine culture     Status: None   Collection Time: 07/13/20 10:10 PM   Specimen: Urine, Clean Catch  Result Value Ref Range Status   Specimen Description URINE, CLEAN CATCH  Final   Special Requests NONE   Final   Culture   Final    NO GROWTH Performed at [facility], [address] [place], [place] [address]    Report Status 07/15/2020 FINAL  Final  Culture, blood (routine x 2)     Status: None   Collection Time: 07/15/20  6:49 PM   Specimen: BLOOD  Result Value Ref Range Status   Specimen Description BLOOD RIGHT ANTECUBITAL  Final   Special Requests   Final    BOTTLES DRAWN AEROBIC AND ANAEROBIC BACTEROIDES CACCAE   Culture   Final    NO GROWTH 5 DAYS Performed at [facility], [place] [address] [place], [place] [address]    Report Status 07/20/2020 FINAL  Final  Culture, blood (routine x 2)     Status: None   Collection Time: 07/15/20  6:50 PM   Specimen: BLOOD  Result Value Ref Range Status   Specimen Description BLOOD BLOOD RIGHT FOREARM  Final   Special Requests   Final    BOTTLES DRAWN AEROBIC AND ANAEROBIC Blood Culture adequate volume   Culture   Final    NO GROWTH 5 DAYS Performed at [facility], [place] [address] [place], [place] [address]    Report Status 07/20/2020 FINAL  Final    Pertinent Lab. CBC Latest Ref Rng & Units 08/07/2020 08/06/2020 08/05/2020  WBC 4.0 - 10.5 K/uL - - -  Hemoglobin 12.0 - 15.0 g/dL 8.2(L) 8.3(L) 8.3(L)  Hematocrit 36.0 - 46.0 % 25.5(L) 26.0(L) 26.1(L)  Platelets 150 - 400 K/uL - - -   CMP Latest Ref Rng & Units 08/07/2020 08/05/2020 08/04/2020  Glucose 70 - 99 mg/dL 110(H) 89 89  BUN 6 - 20 mg/dL _0 Creatinine 0.44 - 1.00 mg/dL 0.56 0.47 0.50  Sodium 135 - 145 mmol/L 132(L) 131(L) 132(L)  Potassium 3.5 - 5.1 mmol/L 3.7 4.0 3.8  Chloride 98 - 111 mmol/L 100 99 98  CO2 22 - 32 mmol/L _1 Calcium 8.9 - 10.3 mg/dL 8.9 9.0 9.0  Total Protein 6.5 - 8.1 g/dL - - -  Total Bilirubin 0.3 - 1.2 mg/dL - - -  Alkaline Phos 38 - 126 U/L - - -  AST 15 - 41 U/L - - -  ALT 0 - 44 U/L - - -  Pertinent Imaging today Plain films and CT images have been personally visualized and interpreted; radiology reports have  been reviewed. Decision making incorporated into the Impression / Recommendations.  I have spent approx 30 minutes for this patient encounter including review of prior medical records, coordination of care  with greater than 50% of time being face to face/counseling and discussing diagnostics/treatment plan with the patient/family.  Electronically signed by:   Rosiland Oz, MD Infectious Disease Physician [facility] Infect[facility] Pager: [phone]

## 2020-08-08 NOTE — Progress Notes (Addendum)
Advanced Heart Failure Rounding Note   Subjective:    Breathing well, walked around the unit just before my arrival denied dyspnea.  Denies cp, orthopnea.     Objective:   Weight Range:  Vital Signs:   Temp:  [97.5 F (36.4 C)-97.8 F (36.6 C)] 97.8 F (36.6 C) (03/28 0509) Pulse Rate:  [64-92] 78 (03/28 0509) Resp:  [14-16] 16 (03/28 0509) BP: (97-123)/(59-74) 97/72 (03/28 0509) SpO2:  [95 %-98 %] 97 % (03/28 0509) Weight:  [84.1 kg] 84.1 kg (03/28 0509) Last BM Date: 08/06/20 (per patient)  Weight change: Filed Weights   08/06/20 0524 08/07/20 0518 08/08/20 0509  Weight: 82.8 kg 82.9 kg 84.1 kg    Intake/Output:  No intake or output data in the 24 hours ending 08/08/20 0752    PHYSICAL EXAM: Cardiac: JVD flat, normal rate and rhythm, clear s1 and s2, 2/6 TR murmur, no rubs or gallops, no LE edema Pulmonary: CTAB, not in distress Abdominal: gravid abdomen, soft and nontender Psych: Alert, conversant, in good spirits    Telemetry: NSR 90s  personally reviewed.   Labs: Basic Metabolic Panel: Recent Labs  Lab 08/02/20 0446 08/03/20 0334 08/04/20 0410 08/05/20 0328 08/07/20 0329  NA 132*  --  132* 131* 132*  K 4.0  --  3.8 4.0 3.7  CL 97*  --  98 99 100  CO2 28  --  27 25 24   GLUCOSE 83  --  89 89 110*  BUN 14  --  12 13 11   CREATININE 0.52 0.52 0.50 0.47 0.56  CALCIUM 9.4  --  9.0 9.0 8.9  MG 1.7  --  1.6* 1.8  --   PHOS 4.4  --  4.4 4.2  --     Liver Function Tests: Recent Labs  Lab 08/02/20 0446  AST 22  ALT 12  ALKPHOS 96  BILITOT 0.5  PROT 6.9  ALBUMIN 2.3*   No results for input(s): LIPASE, AMYLASE in the last 168 hours. No results for input(s): AMMONIA in the last 168 hours.  CBC: Recent Labs  Lab 08/02/20 0446 08/03/20 0334 08/03/20 1550 08/05/20 0328 08/06/20 0327 08/07/20 0329  WBC 9.2  --   --   --   --   --   HGB 8.5* 8.3* 8.9* 8.3* 8.3* 8.2*  HCT 25.5* 26.1* 27.7* 26.1* 26.0* 25.5*  MCV 90.4  --   --   --   --    --   PLT 330  --   --   --   --   --     Cardiac Enzymes: No results for input(s): CKTOTAL, CKMB, CKMBINDEX, TROPONINI in the last 168 hours.  BNP: BNP (last 3 results) Recent Labs    07/13/20 1219  BNP 157.1*    ProBNP (last 3 results) No results for input(s): PROBNP in the last 8760 hours.    Other results:  Imaging: No results found.   Medications:     Scheduled Medications: . sodium chloride   Intravenous Once  . acetaminophen  650 mg Oral Q6H  . Chlorhexidine Gluconate Cloth  6 each Topical Daily  . enoxaparin (LOVENOX) injection  40 mg Subcutaneous Q24H  . feeding supplement  237 mL Oral BID BM  . furosemide  40 mg Oral Daily  . methadone  100 mg Oral Daily  . polyethylene glycol  17 g Oral BID  . prenatal vitamin w/FE, FA  1 tablet Oral Q1200  . senna-docusate  1 tablet  Oral BID  . sodium chloride flush  10-40 mL Intracatheter Q12H    Infusions: . vancomycin 750 mg (08/08/20 0445)    PRN Medications: HYDROmorphone (DILAUDID) injection, magic mouthwash w/lidocaine, melatonin, prochlorperazine   Assessment/Plan:   1. MRSA Sepsis  - bcx 2/2 + for MRSA - clinically improved with IV abx and volume resuscitation.  - ID has seen and adjusting abx. Will need 6 weeks.  - Bld Cx 3/4 - No growth.  - no obvious vegetation on valve on initial TTE but now visible on repeat limited echo 3/23 anterior leaflet of tricuspid valve -Plan to keep in the hospital until IV antibiotics completed. 08/19/20  2. Acute on Chronic Systolic CHF with Mostly Right Sided CHF (Likely Secondary to Severe TR) - Echo 07/13/20 showed normalization of LV function  LVEF of 55% with normal wall motion. RV severely enlarged with moderately reduced systolic function and severely elevated PASP of 92.85mHg. Severe TR noted but no vegetation visualized.  -repeat limited ECHO 3/23 vegitation now visible anterior leaflet of tricuspid valve, severe TR but improved from peak grad 77.4 to  30.2 and  RVSP improved from 92.4 to 33.2.  Otherwise no changes appreciated.    - Volume status stable. Breathing improved  - Continue PO Lasix 40 mg daily  - Renal function stable. Repeat bmp tomorrow  3. 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 He was seen by Dr. GServando Snarein 05/2019 for possible surgical repair. However, patient was lost to follow-up and unfortunately started using drugs again. - Echo this admission shows severe TR but no signs of vegetation.  4. Pregnancy  - 29w 6d pregnant - weekly [place] daily FHT okay, normal AFI 3/22  - High risk OB following  5. Secondary Syphilis -Treated  6. Polysubstance Abuse - Had using IV heroin + fentanyl - On methadone.   7 Hyponatremia - likely due to hypervolemia - 132 on 3/27   Length of Stay: 2ChamberinoPA-C 08/08/2020, 7:52 AM  Advanced Heart Failure Team Pager [phone](M-F; [place]  Please contact [facility] for night-coverage after hours (4p -7a ) and weekends on [email or web address]  Patient seen and examined with the above-signed Advanced Practice Provider and/or Housestaff. I personally reviewed laboratory data, imaging studies and relevant notes. I independently examined the patient and formulated the important aspects of the plan. I have edited the note to reflect any of my changes or salient points. I have personally discussed the plan with the patient and/or family.  Looks good. Volume status stable. Fetus stable. Now almost 30 weeks. Continue lasix 40 daily  Exam stable  IV abx will finish 08/19/20. Awaiting what plan will be to follow patient until delivery to avoid drug abuse recidivism prior to delivery.   DGlori Bickers MD  11:31 AM

## 2020-08-08 NOTE — Progress Notes (Addendum)
FACULTY PRACTICE ANTEPARTUM(COMPREHENSIVE) NOTE  Kelly [name] is a 35 y.o. [phone] with Estimated Date of Delivery: 10/18/20   By  ultrasound [redacted]w[redacted]d who is admitted for MRSA bacteremia, endocarditis with severe tricuspid regurgitation, septic emboli with h/o discitis/epidural abscess, chronic HTN and acute on chronic right sided heart failure in the setting of IV heroin and fentanyl abuse   Fetal presentation is cephalic. Length of Stay:  26  Days  Date of admission:07/13/2020  Subjective: Pt resting comfortably this am.  Reports no acute OB complaint.  She feels like the methadone is working, but come mid-day does note some edginess. Patient reports the fetal movement as active. Patient reports uterine contraction  activity as none. Patient reports  vaginal bleeding as none. Patient describes fluid per vagina as None.  Vitals:  Blood pressure 102/60, pulse 88, temperature (!) 97.4 F (36.3 C), temperature source Oral, resp. rate 16, height 5' 7.5" (1.715 m), weight 84.1 kg, SpO2 95 %, not currently breastfeeding. Vitals:   08/07/20 1715 08/08/20 0055 08/08/20 0509 08/08/20 1210  BP: (!) 105/59 107/61 97/72 102/60  Pulse: 76 64 78 88  Resp: 14 16 16 16   Temp: 97.8 F (36.6 C) (!) 97.5 F (36.4 C) 97.8 F (36.6 C) (!) 97.4 F (36.3 C)  TempSrc: Oral Oral Oral Oral  SpO2: 95% 98% 97% 95%  Weight:   84.1 kg   Height:       Physical Examination:  General appearance - alert and in no distress Mental status - alert, oriented to person, place, and time Chest - normal respiratory effort, CTAB Heart - normal rate and rhythm Abdomen - gravid, soft and non-tender Extremities - no edema or calf tenderness  Fetal Monitoring: NST completed- reactive Labs:  No results found for this or any previous visit (from the past 24 hour(s)).  Imaging Studies:    No results found.   Medications:  Scheduled . sodium chloride   Intravenous Once  . acetaminophen  650 mg Oral Q6H  .  Chlorhexidine Gluconate Cloth  6 each Topical Daily  . enoxaparin (LOVENOX) injection  40 mg Subcutaneous Q24H  . feeding supplement  237 mL Oral BID BM  . furosemide  40 mg Oral Daily  . [START ON 08/09/2020] methadone  110 mg Oral Daily  . polyethylene glycol  17 g Oral BID  . prenatal vitamin w/FE, FA  1 tablet Oral Q1200  . senna-docusate  1 tablet Oral BID  . sodium chloride flush  10-40 mL Intracatheter Q12H   I have reviewed the patient's current medications.  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   JenClearnce Sorrelan 08/08/2020,3:18 PM

## 2020-08-08 NOTE — Progress Notes (Signed)
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 -Current active IV heroin user, uses about 3 g daily and IV fentanyl, presented to the ED with 3 to 4 weeks history of bilateral lower extremity edema associated with worsening dyspnea on exertion for 4 days, intermittent fever for several weeks and pain with deep inspiration across her right lateral chest and flank. -In the ED she was noted to have fluid overload, blood cultures were positive for MRSA and she was a started on IV vancomycin. -2D echo without obvious vegetation, TEE was deferred and plan is to treat empirically for endocarditis (6 weeks of vancomycin) -Patient was also found to be [redacted] weeks Pregnant.Currently 30 weeks pregnancy.She will remain inpatient to complete 6 weeks of IV antibiotics.   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)   Tricuspid regurgitation   Supervision of other high risk pregnancies, third trimester   Preexisting hypertension complicating pregnancy, antepartum   Dental caries   Endocarditis   History of VBAC   Lumbar back pain   Unwanted fertility   Acute on chronic congestive heart failure (HCC)   Cellulitis of right lower extremity   Septic embolism (HCC)    Syphilis   [redacted] weeks gestation of pregnancy   Abdominal pain   Drug use affecting pregnancy in second trimester   Methadone maintenance treatment affecting pregnancy ([place])   Maternal endocarditis complicating pregnancy in third trimester   Pain, dental   30 weeks pregnancy : -Management per primary service, OB -She will most likely stay in the hospital until she delivers the baby. -She is homeless, she would continue to use IV drugs if she is discharged after she completes antibiotics. -I had a detailed conversation with OB/GYN.   MRSA bacteremia with presumed endocarditis : -Per ID recommendations, continue IV vancomycin until 4/14. -2D echo without obvious vegetations, TEE deferred as empirically treating this as endocarditis. -Repeat blood cultures so far negative. -On IV vancomycin x 6 weeks, remains afebrile. -She will remain in hospital to complete 6 weeks of IV antibiotics.  Acute on chronic right-sided heart failure with severe TR, severe pulmonary hypertension -2D echo showed decreased RV systolic function, elevated PA pressure 92, severe TR -Echo 3/23: LVEF 50-55%, Mobile echodensity likely representing vegetation measuring 1cm x  0.6 cm seen on anterior leaflet of tricuspid valve. -She is being followed by cardiology/CHF team, transition to Lasix oral on 3/12  -Negative balance of 8.7 L, continue Lasix 40 mg PO daily.  Anemia of chronic disease - Baseline hemoglobin 8.5-9.5 - B12 445, folate 7.4, iron 32, ferritin 111 - Continue prenatal vitamins, H&H stable, 8.3  History of IV heroin and fentanyl abuse, chronic pain syndrome -Patient now started on methadone 100 mg daily on 3/15, she has previously tolerated 100 mg of methadone daily -Dilaudid decreased to 0.5- 1 mg every  4 hours as needed for severe/breakthrough pain -HCV antibody+, hep A, hep B negative -Currently no complaints, feels back to her baseline, denies nausea, vomiting. -She reports pain is not  controlled, she was on 120 mg of methadone on last admission. -Discussed with OB/GYN, methadone dose increased to 110 milligrams daily  Pneumonia, possibly septic pulmonary infarcts; -Continue IV vancomycin.  Syphilis -Positive RPR with rash per ID, could be secondary syphilis -Penicillin 2.4MU IM given  Hyponatremia -Stable,132 >131>132  Back pain -Unable to tolerate MRI, was discontinued, currently on IV antibiotics. - MRI [place] spine ordered, will give low dose ativan before MRI  Jaw pain, tooth pain, dental caries -Placed on Augmentin, multiple caries, outpatient follow-up with [facility]    DVT prophylaxis: Lovenox Code Status: Full code. Family Communication: No family at bed side. Disposition Plan:  Status is: Inpatient  Remains inpatient appropriate because:Inpatient level of care appropriate due to severity of illness   Dispo: The patient is from: Home              Anticipated d/c is to: Home              Patient currently is not medically stable to d/c.   Difficult to place patient No   She will remain in the hospital to complete IV antibiotics for 6 weeks, she might remain in the hospital until she delivers the baby.  Consultants:   Cardiology, infectious diseases, cardiothoracic surgery  Procedures:   Antimicrobials:  Anti-infectives (From admission, onward)   Start     Dose/Rate Route Frequency Ordered Stop   07/22/20 1045  amoxicillin-clavulanate (AUGMENTIN) 875-125 MG per tablet 1 tablet        1 tablet Oral Every 12 hours 07/22/20 0952 07/31/20 2023   07/18/20 0830  vancomycin (VANCOCIN) IVPB 750 mg/150 ml premix        750 mg 150 mL/hr over 60 Minutes Intravenous Every 8 hours 07/18/20 0750     07/16/20 0400  Vancomycin (VANCOCIN) 1,250 mg in sodium chloride 0.9 % 250 mL IVPB  Status:  Discontinued        1,250 mg 166.7 mL/hr over 90 Minutes Intravenous Every 8 hours 07/15/20 1944 07/18/20 0749   07/15/20 1715  penicillin g  benzathine (BICILLIN LA) 1200000 UNIT/2ML injection 2.4 Million Units        2.4 Million Units Intramuscular  Once 07/15/20 1621 07/15/20 1747   07/14/20 0300  vancomycin (VANCOCIN) IVPB 1000 mg/200 mL premix        1,000 mg 200 mL/hr over 60 Minutes Intravenous Every 8 hours 07/13/20 1806 07/15/20 2059   07/13/20 1730  Vancomycin (VANCOCIN) 1,500 mg in sodium chloride 0.9 % 500 mL IVPB        1,500 mg 250 mL/hr over 120 Minutes Intravenous  Once 07/13/20 1709 07/13/20 2116   07/13/20 1730  ceFEPIme (MAXIPIME) 2 g in sodium chloride 0.9 % 100 mL IVPB  Status:  Discontinued        2 g 200 mL/hr over 30 Minutes Intravenous Every 8 hours 07/13/20 1709 07/14/20 1050   07/13/20 1315  cefTRIAXone (ROCEPHIN) 1 g in sodium chloride 0.9 % 100 mL IVPB        1 g 200 mL/hr over 30 Minutes Intravenous  Once 07/13/20 1306 07/13/20 1538   07/13/20 1315  azithromycin (ZITHROMAX) 500 mg in sodium chloride 0.9 % 250 mL IVPB        500 mg 250 mL/hr over 60 Minutes Intravenous  Once 07/13/20 1306 07/13/20 1644      Subjective: Patient was seen and examined at bedside.  Overnight events noted.   She is lying comfortably in the bed. She asks for sleeping medication at night. Patient denies any shortness of breath and chest pain.  She reports fetal movements. Patient asks to increase her methadone dose, states pain is not controlled.  Objective: Vitals:   08/07/20 1715 08/08/20 0055 08/08/20 0509 08/08/20 1210  BP: (!) 105/59 107/61 97/72 102/60  Pulse: 76 64 78 88  Resp: 14 16 16 16   Temp: 97.8 F (36.6 C) (!) 97.5 F (36.4 C) 97.8 F (36.6 C) (!) 97.4 F (36.3 C)  TempSrc: Oral Oral Oral Oral  SpO2: 95% 98% 97% 95%  Weight:   84.1 kg   Height:       No intake or output data in the 24 hours ending 08/08/20 1514 Filed Weights   08/06/20 0524 08/07/20 0518 08/08/20 0509  Weight: 82.8 kg 82.9 kg 84.1 kg    Examination:  General exam: Appears calm and comfortable, not in any acute  distress Respiratory system: Clear to auscultation. Respiratory effort normal. Cardiovascular system: S1 & S2 heard, RRR. No JVD, murmurs, rubs, gallops or clicks. No pedal edema. Gastrointestinal system: Abdomen is nondistended, soft and nontender. No organomegaly or masses felt.  Normal bowel sounds heard. Central nervous system: Alert and oriented. No focal neurological deficits. Extremities: Symmetric 5 x 5 power. No edema, no cyanosis, no clubbing. Skin: No rashes, lesions or ulcers Psychiatry: Judgement and insight appear normal. Mood & affect appropriate.     Data Reviewed: I have personally reviewed following labs and imaging studies  CBC: Recent Labs  Lab 08/02/20 0446 08/03/20 0334 08/03/20 1550 08/05/20 0328 08/06/20 0327 08/07/20 0329  WBC 9.2  --   --   --   --   --   HGB 8.5* 8.3* 8.9* 8.3* 8.3* 8.2*  HCT 25.5* 26.1* 27.7* 26.1* 26.0* 25.5*  MCV 90.4  --   --   --   --   --   PLT 330  --   --   --   --   --    Basic Metabolic Panel: Recent Labs  Lab 08/02/20 0446 08/03/20 0334 08/04/20 0410 08/05/20 0328 08/07/20 0329  NA 132*  --  132* 131* 132*  K 4.0  --  3.8 4.0 3.7  CL 97*  --  98 99 100  CO2 28  --  27 25 24   GLUCOSE 83  --  89 89 110*  BUN 14  --  12 13 11   CREATININE 0.52 0.52 0.50 0.47 0.56  CALCIUM 9.4  --  9.0 9.0 8.9  MG 1.7  --  1.6* 1.8  --   PHOS 4.4  --  4.4 4.2  --    GFR: Estimated Creatinine Clearance: 110.5 mL/min (by C-G formula based on SCr of 0.56 mg/dL). Liver Function Tests: Recent Labs  Lab 08/02/20 0446  AST 22  ALT 12  ALKPHOS 96  BILITOT 0.5  PROT 6.9  ALBUMIN 2.3*   No results for input(s): LIPASE, AMYLASE in the last 168 hours. No results for input(s): AMMONIA in the last 168 hours. Coagulation Profile: No results for input(s): INR, PROTIME in the last 168 hours. Cardiac Enzymes: No results for input(s): CKTOTAL, CKMB, CKMBINDEX, TROPONINI in the last 168 hours. BNP (last 3 results) No results for  input(s): PROBNP in the last 8760 hours. HbA1C: No results for input(s):  HGBA1C in the last 72 hours. CBG: Recent Labs  Lab 08/04/20 1248 08/04/20 1635 08/04/20 2030 08/05/20 0552 08/05/20 1046  GLUCAP 94 109* 90 95 80   Lipid Profile: No results for input(s): CHOL, HDL, LDLCALC, TRIG, CHOLHDL, LDLDIRECT in the last 72 hours. Thyroid Function Tests: No results for input(s): TSH, T4TOTAL, FREET4, T3FREE, THYROIDAB in the last 72 hours. Anemia Panel: No results for input(s): VITAMINB12, FOLATE, FERRITIN, TIBC, IRON, RETICCTPCT in the last 72 hours. Sepsis Labs: No results for input(s): PROCALCITON, LATICACIDVEN in the last 168 hours.  No results found for this or any previous visit (from the past 240 hour(s)).   Radiology Studies: No results found.  Scheduled Meds: . sodium chloride   Intravenous Once  . acetaminophen  650 mg Oral Q6H  . Chlorhexidine Gluconate Cloth  6 each Topical Daily  . enoxaparin (LOVENOX) injection  40 mg Subcutaneous Q24H  . feeding supplement  237 mL Oral BID BM  . furosemide  40 mg Oral Daily  . [START ON 08/09/2020] methadone  110 mg Oral Daily  . polyethylene glycol  17 g Oral BID  . prenatal vitamin w/FE, FA  1 tablet Oral Q1200  . senna-docusate  1 tablet Oral BID  . sodium chloride flush  10-40 mL Intracatheter Q12H   Continuous Infusions: . vancomycin 750 mg (08/08/20 1220)     LOS: 26 days    Time spent: 25 mins    Chosen Garron, MD [facility]   If 7PM-7AM, please contact night-coverage

## 2020-08-08 NOTE — Progress Notes (Signed)
NST reactive. Audible fetal movement. Pt denies uc's, vaginal bleeding or leaking of fluid. Pt slept through her monitoring.

## 2020-08-09 ENCOUNTER — Inpatient Hospital Stay (HOSPITAL_BASED_OUTPATIENT_CLINIC_OR_DEPARTMENT_OTHER): Payer: Medicaid Other

## 2020-08-09 DIAGNOSIS — B9562 Methicillin resistant Staphylococcus aureus infection as the cause of diseases classified elsewhere: Secondary | ICD-10-CM | POA: Diagnosis not present

## 2020-08-09 DIAGNOSIS — Z3A30 30 weeks gestation of pregnancy: Secondary | ICD-10-CM

## 2020-08-09 DIAGNOSIS — I330 Acute and subacute infective endocarditis: Secondary | ICD-10-CM | POA: Diagnosis not present

## 2020-08-09 DIAGNOSIS — O99323 Drug use complicating pregnancy, third trimester: Secondary | ICD-10-CM

## 2020-08-09 DIAGNOSIS — O10913 Unspecified pre-existing hypertension complicating pregnancy, third trimester: Secondary | ICD-10-CM | POA: Diagnosis not present

## 2020-08-09 DIAGNOSIS — O09523 Supervision of elderly multigravida, third trimester: Secondary | ICD-10-CM

## 2020-08-09 DIAGNOSIS — O322XX0 Maternal care for transverse and oblique lie, not applicable or unspecified: Secondary | ICD-10-CM

## 2020-08-09 DIAGNOSIS — R7881 Bacteremia: Secondary | ICD-10-CM | POA: Diagnosis not present

## 2020-08-09 DIAGNOSIS — I119 Hypertensive heart disease without heart failure: Secondary | ICD-10-CM | POA: Diagnosis not present

## 2020-08-09 DIAGNOSIS — O34219 Maternal care for unspecified type scar from previous cesarean delivery: Secondary | ICD-10-CM

## 2020-08-09 DIAGNOSIS — O99413 Diseases of the circulatory system complicating pregnancy, third trimester: Secondary | ICD-10-CM

## 2020-08-09 LAB — BASIC METABOLIC PANEL
Anion gap: 8 (ref 5–15)
BUN: 12 mg/dL (ref 6–20)
CO2: 25 mmol/L (ref 22–32)
Calcium: 9 mg/dL (ref 8.9–10.3)
Chloride: 99 mmol/L (ref 98–111)
Creatinine, Ser: 0.49 mg/dL (ref 0.44–1.00)
GFR, Estimated: >60 mL/min (ref >60)
Glucose, Bld: 88 mg/dL (ref 70–99)
Potassium: 3.8 mmol/L (ref 3.5–5.1)
Sodium: 132 mmol/L — ABNORMAL LOW (ref 135–145)

## 2020-08-09 LAB — C-REACTIVE PROTEIN: CRP: 0.7 mg/dL (ref <1.0)

## 2020-08-09 LAB — SEDIMENTATION RATE: Sed Rate: >140 mm/hr — ABNORMAL HIGH (ref 0–22)

## 2020-08-09 NOTE — Progress Notes (Signed)
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 -Current active IV heroin user, uses about 3 g daily and IV fentanyl, presented to the ED with 3 to 4 weeks history of bilateral lower extremity edema associated with worsening dyspnea on exertion for 4 days, intermittent fever for several weeks and pain with deep inspiration across her right lateral chest and flank. -In the ED she was noted to have fluid overload, blood cultures were positive for MRSA and she was a started on IV vancomycin. -2D echo without obvious vegetation, TEE was deferred and plan is to treat empirically for endocarditis (6 weeks of vancomycin) -Patient was also found to be [redacted] weeks Pregnant.Currently 30 weeks pregnancy.She will remain inpatient to complete 6 weeks of IV antibiotics.   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)   Tricuspid regurgitation   Supervision of other high risk pregnancies, third trimester   Preexisting hypertension complicating pregnancy, antepartum   Dental caries   Endocarditis   History of VBAC   Lumbar back pain   Unwanted fertility   Acute on chronic congestive heart failure (HCC)   Cellulitis of right lower extremity   Septic embolism (HCC)    Syphilis   [redacted] weeks gestation of pregnancy   Abdominal pain   Drug use affecting pregnancy in second trimester   Methadone maintenance treatment affecting pregnancy ([place])   Maternal endocarditis complicating pregnancy in third trimester   Pain, dental   30 weeks pregnancy : -Management per primary service, OB -She will most likely stay in the hospital until she delivers the baby. -She is homeless, she would continue to use IV drugs if she is discharged after she completes antibiotics. -I had a detailed conversation with OB/GYN. Unsafe discharge.  MRSA bacteremia with presumed endocarditis : -Per ID recommendations, continue IV vancomycin until 4/14. -2D echo without obvious vegetations, TEE deferred as empirically treating this as endocarditis. -Repeat blood cultures so far negative. -On IV vancomycin x 6 weeks, remains afebrile. -She will remain in hospital to complete 6 weeks of IV antibiotics.  Acute on chronic right-sided heart failure with severe TR, severe pulmonary hypertension -2D echo showed decreased RV systolic function, elevated PA pressure 92, severe TR -Echo 3/23: LVEF 50-55%, [place] echodensity likely representing vegetation measuring 1cm x  0.6 cm seen on anterior leaflet of tricuspid valve. -She is being followed by cardiology/CHF team, transition to Lasix oral on 3/12  -Negative balance of 8.7 L, continue Lasix 40 mg PO daily.  Anemia of chronic disease - Baseline hemoglobin 8.5-9.5 - B12 445, folate 7.4, iron 32, ferritin 111 - Continue prenatal vitamins, H&H stable, 8.3  History of IV heroin and fentanyl abuse, chronic pain syndrome -Patient now started on methadone 100 mg daily on 3/15, she has previously tolerated 100 mg of methadone daily -Dilaudid decreased to 0.5- 1 mg  every 4 hours as needed for severe/breakthrough pain -HCV antibody+, hep A, hep B negative -Currently no complaints, feels back to her baseline, denies nausea, vomiting. -She reports  pain is not controlled, she was on 120 mg of methadone on last admission. -Discussed with OB/GYN, methadone dose increased to 110 milligrams daily  Pneumonia, possibly septic pulmonary infarcts; -Continue IV vancomycin.  Syphilis -Positive RPR with rash per ID, could be secondary syphilis -Penicillin 2.4MU IM given  Hyponatremia -Stable,132 >131>132  Back pain -Unable to tolerate MRI, was discontinued, currently on IV antibiotics. - MRI [place] spine ordered, will give low dose ativan before MRI  Jaw pain, tooth pain, dental caries -Placed on Augmentin, multiple caries, outpatient follow-up with [facility]    DVT prophylaxis: Lovenox Code Status: Full code. Family Communication: No family at bed side. Disposition Plan:  Status is: Inpatient  Remains inpatient appropriate because:Inpatient level of care appropriate due to severity of illness   Dispo: The patient is from: Home              Anticipated d/c is to: Home              Patient currently is not medically stable to d/c.   Difficult to place patient No   She will remain in the hospital to complete IV antibiotics for 6 weeks, she might remain in the hospital until she delivers the baby.  Consultants:   Cardiology, infectious diseases, cardiothoracic surgery  Procedures:   Antimicrobials:  Anti-infectives (From admission, onward)   Start     Dose/Rate Route Frequency Ordered Stop   07/22/20 1045  amoxicillin-clavulanate (AUGMENTIN) 875-125 MG per tablet 1 tablet        1 tablet Oral Every 12 hours 07/22/20 0952 07/31/20 2023   07/18/20 0830  vancomycin (VANCOCIN) IVPB 750 mg/150 ml premix        750 mg 150 mL/hr over 60 Minutes Intravenous Every 8 hours 07/18/20 0750     07/16/20 0400  Vancomycin (VANCOCIN) 1,250 mg in sodium chloride 0.9 % 250 mL IVPB  Status:  Discontinued        1,250 mg 166.7 mL/hr over 90 Minutes Intravenous Every 8 hours 07/15/20 1944 07/18/20 0749   07/15/20 1715   penicillin g benzathine (BICILLIN LA) 1200000 UNIT/2ML injection 2.4 Million Units        2.4 Million Units Intramuscular  Once 07/15/20 1621 07/15/20 1747   07/14/20 0300  vancomycin (VANCOCIN) IVPB 1000 mg/200 mL premix        1,000 mg 200 mL/hr over 60 Minutes Intravenous Every 8 hours 07/13/20 1806 07/15/20 2059   07/13/20 1730  Vancomycin (VANCOCIN) 1,500 mg in sodium chloride 0.9 % 500 mL IVPB        1,500 mg 250 mL/hr over 120 Minutes Intravenous  Once 07/13/20 1709 07/13/20 2116   07/13/20 1730  ceFEPIme (MAXIPIME) 2 g in sodium chloride 0.9 % 100 mL IVPB  Status:  Discontinued        2 g 200 mL/hr over 30 Minutes Intravenous Every 8 hours 07/13/20 1709 07/14/20 1050   07/13/20 1315  cefTRIAXone (ROCEPHIN) 1 g in sodium chloride 0.9 % 100 mL IVPB        1 g 200 mL/hr over 30 Minutes Intravenous  Once 07/13/20 1306 07/13/20 1538   07/13/20 1315  azithromycin (ZITHROMAX) 500 mg in sodium chloride 0.9 % 250 mL IVPB        500 mg 250 mL/hr over 60 Minutes Intravenous  Once 07/13/20 1306 07/13/20 1644      Subjective: Patient was seen and examined at bedside.  Overnight events noted.   She is lying comfortably in the bed. She reports good night sleep after she received ambien last night. Patient denies any shortness of breath and chest pain.  She reports fetal movements.  Objective: Vitals:   08/08/20 1901 08/09/20 0001 08/09/20 0535 08/09/20 1211  BP: 101/72 101/66 104/61 106/87  Pulse: 78 83 78 87  Resp: 16 18 16 17   Temp: 97.8 F (36.6 C) 98.1 F (36.7 C) 97.9 F (36.6 C) 98 F (36.7 C)  TempSrc: Oral Oral Oral Oral  SpO2: 98% 96% 98% 99%  Weight:   83.5 kg   Height:        Intake/Output Summary (Last 24 hours) at 08/09/2020 1406 Last data filed at 08/09/2020 0900 Gross per 24 hour  Intake 720 ml  Output --  Net 720 ml   Filed Weights   08/07/20 0518 08/08/20 0509 08/09/20 0535  Weight: 82.9 kg 84.1 kg 83.5 kg    Examination:  General exam: Appears calm and  comfortable, not in any acute distress Respiratory system: Clear to auscultation. Respiratory effort normal. Cardiovascular system: S1 & S2 heard, RRR. No JVD, murmurs, rubs, gallops or clicks. No pedal edema. Gastrointestinal system: Abdomen is nondistended, soft and nontender. No organomegaly or masses felt.  Normal bowel sounds heard. Central nervous system: Alert and oriented. No focal neurological deficits. Extremities: Symmetric 5 x 5 power. No edema, no cyanosis, no clubbing. Skin: No rashes, lesions or ulcers Psychiatry: Judgement and insight appear normal. Mood & affect appropriate.     Data Reviewed: I have personally reviewed following labs and imaging studies  CBC: Recent Labs  Lab 08/03/20 0334 08/03/20 1550 08/05/20 0328 08/06/20 0327 08/07/20 0329  HGB 8.3* 8.9* 8.3* 8.3* 8.2*  HCT 26.1* 27.7* 26.1* 26.0* 09.4*   Basic Metabolic Panel: Recent Labs  Lab 08/03/20 0334 08/04/20 0410 08/05/20 0328 08/07/20 0329 08/09/20 0205  NA  --  132* 131* 132* 132*  K  --  3.8 4.0 3.7 3.8  CL  --  98 99 100 99  CO2  --  27 25 24 25   GLUCOSE  --  89 89 110* 88  BUN  --  12 13 11 12   CREATININE 0.52 0.50 0.47 0.56 0.49  CALCIUM  --  9.0 9.0 8.9 9.0  MG  --  1.6* 1.8  --   --   PHOS  --  4.4 4.2  --   --    GFR: Estimated Creatinine Clearance: 110.2 mL/min (by C-G formula based on SCr of 0.49 mg/dL). Liver Function Tests: No results for input(s): AST, ALT, ALKPHOS, BILITOT, PROT, ALBUMIN in the last 168 hours. No results for input(s): LIPASE, AMYLASE in the last 168 hours. No results for input(s): AMMONIA in the last 168 hours. Coagulation Profile: No results for input(s): INR, PROTIME in the last 168 hours. Cardiac Enzymes: No results for input(s): CKTOTAL, CKMB, CKMBINDEX, TROPONINI in the last 168 hours. BNP (last 3 results) No results for input(s): PROBNP in the last 8760 hours. HbA1C: No results for input(s): HGBA1C in the last 72 hours. CBG: Recent Labs   Lab 08/04/20 1248 08/04/20 1635 08/04/20 2030 08/05/20 0552 08/05/20 1046  GLUCAP 94 109* 90 95 80   Lipid Profile: No results for input(s): CHOL, HDL, LDLCALC, TRIG, CHOLHDL, LDLDIRECT in the last 72 hours. Thyroid Function Tests: No results for input(s): TSH, T4TOTAL,  FREET4, T3FREE, THYROIDAB in the last 72 hours. Anemia Panel: No results for input(s): VITAMINB12, FOLATE, FERRITIN, TIBC, IRON, RETICCTPCT in the last 72 hours. Sepsis Labs: No results for input(s): PROCALCITON, LATICACIDVEN in the last 168 hours.  No results found for this or any previous visit (from the past 240 hour(s)).   Radiology Studies: MR LUMBAR SPINE WO CONTRAST  Result Date: 08/08/2020 CLINICAL DATA:  Low back pain, infection suspected. EXAM: MRI LUMBAR SPINE WITHOUT CONTRAST TECHNIQUE: Multiplanar, multisequence MR imaging of the lumbar spine was performed. No intravenous contrast was administered. COMPARISON:  None. FINDINGS: Segmentation:  Standard. Alignment:  Levoconvex scoliosis. Vertebrae: Postsurgical change from spinal fixation extending from the visualized lower thoracic spine to the L3 level. There is prominent susceptibility artifact from surgical hardware degrading the images at this levels. No signal changes within the intervertebral discs or marrow edema identified to suggest discitis/osteomyelitis. No acute fracture or aggressive bone lesion identified. Conus medullaris and cauda equina: Conus medullaris is obscured by susceptibility artifact. Paraspinal and other soft tissues: Intrauterine pregnancy is incidentally seen. Disc levels: T12-L1 through L3-4: No significant disc herniation, spinal canal or neural foraminal stenosis. L4-5: Postsurgical changes from prior posterior decompression. Loss of disc height, disc bulge and moderate facet degenerative changes, left greater than right without significant spinal canal or neural foraminal stenosis. L5-S1: Postsurgical changes from prior posterior  decompression. Loss of disc height, disc bulge and moderate facet degenerative changes, left greater than right, resulting in mild-to-moderate left neural foraminal narrowing which may impinge on the exiting left L5 nerve root. No spinal canal stenosis. IMPRESSION: 1. Postsurgical changes from prior posterior decompression at L4-5 and L5-S1. 2. Mild to moderate left neural foraminal narrowing at L5-S1 which may impinge on the exiting left L5 nerve root. 3. No significant spinal canal stenosis at any level. 4. No evidence of discitis/osteomyelitis. Electronically Signed   By: Pedro Earls M.D.   On: 08/08/2020 16:22   [place] MFM FETAL BPP WO NON STRESS  Result Date: 08/09/2020 ----------------------------------------------------------------------  OBSTETRICS REPORT                       (Signed Final 08/09/2020 12:35 pm) ---------------------------------------------------------------------- Patient Info  ID #:       [id number]                          D.O.B.:  April 06, 1986 (34 yrs)  Name:       Kelly [name]              Visit Date: 08/09/2020 09:09 am ---------------------------------------------------------------------- Performed By  Attending:        Tama High MD        Ref. Address:     [place]  Performed By:     Valda Favia          [facility]  Referred By:      Sloan Leiter                    MD ---------------------------------------------------------------------- Orders  #  Description                           Code        Ordered By  1  [place] MFM FETAL BPP WO NON               76819.01    [place]  2  [place] MFM OB FOLLOW UP                   12248.25    [place] ----------------------------------------------------------------------  #  Order #                     Accession #                Episode #  1   [id number]                   [id number]                 [id number]  2  [id number]                   [id number]                 [id number] ---------------------------------------------------------------------- Indications  Hypertension - Chronic/Pre-existing            O10.019  [redacted] weeks gestation of pregnancy                Z3A.46  Heart disease in mother affecting pregnancy    O99.413  in third trimester  Drug use complicating pregnancy, third         O41.323  trimester  Advanced maternal age multigravida 37+,        O79.523  third trimester  Previous cesarean delivery, antepartum x1      O34.219  Heart disease in mother affecting pregnancy    O99.413  in third trimester ---------------------------------------------------------------------- Fetal Evaluation  Num Of Fetuses:         1  Fetal Heart Rate(bpm):  137  Cardiac Activity:       Observed  Presentation:           Transverse, head to maternal left  Placenta:               Anterior  P. Cord Insertion:      Visualized  Amniotic Fluid  AFI FV:      Within normal limits  AFI Sum(cm)     %Tile       Largest Pocket(cm)  9.2             7           3.3  RUQ(cm)       RLQ(cm)       LUQ(cm)        LLQ(cm)  1.2           3.3           2.2            2.5 ---------------------------------------------------------------------- Biophysical Evaluation  Amniotic F.V:   Pocket => 2 cm             F.  Tone:        Observed  F. Movement:    Observed                   Score:          6/8  F. Breathing:   Not Observed ---------------------------------------------------------------------- Biometry  BPD:      74.1  mm     G. Age:  29w 5d         29  %    CI:        73.23   %    70 - 86                                                          FL/HC:      20.3   %    19.2 - 21.4  HC:      275.2  mm     G. Age:  30w 1d         18  %    HC/AC:      0.99        0.99 - 1.21  AC:      278.2  mm     G. Age:  31w 6d         91  %    FL/BPD:     75.6   %    71 - 87  FL:         56  mm     G. Age:   29w 3d         21  %    FL/AC:      20.1   %    20 - 24  CER:      33.8  mm     G. Age:  28w 5d          8  %  Est. FW:    1637  gm    3 lb 10 oz      65  % ---------------------------------------------------------------------- OB History  Gravidity:    7         Term:   3        Prem:   0        SAB:   3  TOP:          0       Ectopic:  0        Living: 3 ---------------------------------------------------------------------- Gestational Age  U/S Today:     30w 2d                                        EDD:   10/16/20  Best:          30w 0d     Det. By:  U/S  (07/13/20)          EDD:   10/18/20 ---------------------------------------------------------------------- Anatomy  Cranium:               Appears normal         LVOT:                   Not well visualized  Cavum:  Previously seen        Aortic Arch:            Not well visualized  Ventricles:            Appears normal         Ductal Arch:            Appears normal  Choroid Plexus:        Previously seen        Diaphragm:              Previously seen  Cerebellum:            Appears normal         Stomach:                Appears normal, left                                                                        sided  Posterior Fossa:       Appears normal         Abdomen:                Appears normal  Nuchal Fold:           Not applicable (>89    Abdominal Wall:         Not well visualized                         wks GA)  Face:                  Not well visualized    Cord Vessels:           Appears normal (3                                                                        vessel cord)  Lips:                  Appears normal         Kidneys:                Appear normal  Palate:                Not well visualized    Bladder:                Appears normal  Thoracic:              Appears normal         Spine:                  Not well visualized  Heart:                 Appears normal         Upper Extremities:      Previously seen                          (  4CH, axis, and                         situs)  RVOT:                  Appears normal         Lower Extremities:      Previously seen  Other:  Fetus appears to be a female. Technically difficult due to maternal          habitus and fetal position. ---------------------------------------------------------------------- Cervix Uterus Adnexa  Cervix  Not visualized (advanced GA >24wks)  Uterus  No abnormality visualized.  Right Ovary  No adnexal mass visualized.  Left Ovary  No adnexal mass visualized.  Cul De Sac  No free fluid seen.  Adnexa  No abnormality visualized. ---------------------------------------------------------------------- Impression  Fetal growth is appropriate for gestational age .Amniotic fluid  is normal and good fetal activity is seen .Antenatal testing is  reassuring. BPP 8/8. ---------------------------------------------------------------------- Recommendations  -Continue weekly BPP till delivery. ----------------------------------------------------------------------                  Tama High, MD Electronically Signed Final Report   08/09/2020 12:35 pm ----------------------------------------------------------------------  [place] MFM OB FOLLOW UP  Result Date: 08/09/2020 ----------------------------------------------------------------------  OBSTETRICS REPORT                       (Signed Final 08/09/2020 12:35 pm) ---------------------------------------------------------------------- Patient Info  ID #:       [id number]                          D.O.B.:  June 27, 1985 (34 yrs)  Name:       Kelly [name]              Visit Date: 08/09/2020 09:09 am ---------------------------------------------------------------------- Performed By  Attending:        Tama High MD        Ref. Address:     [place]  Performed By:     Valda Favia          [facility]                    [place]  Referred By:      Sloan Leiter                    MD ---------------------------------------------------------------------- Orders  #  Description                           Code        Ordered By  1  [place] MFM FETAL BPP WO NON               [phone]  [place]  2  [place] MFM OB FOLLOW UP                   [id number]    [place] ----------------------------------------------------------------------  #  Order #                     Accession #                Episode #  1  [id number]                   [id number]                 [id number]  2  [id number]                   [id number]                 [id number] ---------------------------------------------------------------------- Indications  Hypertension - Chronic/Pre-existing            O10.019  [redacted] weeks gestation of pregnancy                Z3A.84  Heart disease in mother affecting pregnancy    O99.413  in third trimester  Drug use complicating pregnancy, third         O11.323  trimester  Advanced maternal age multigravida 63+,        O15.523  third trimester  Previous cesarean delivery, antepartum x1      O34.219  Heart disease in mother affecting pregnancy    O99.413  in third trimester ---------------------------------------------------------------------- Fetal Evaluation  Num Of Fetuses:         1  Fetal Heart Rate(bpm):  137  Cardiac Activity:       Observed  Presentation:           Transverse, head to maternal left  Placenta:               Anterior  P. Cord Insertion:      Visualized  Amniotic Fluid  AFI FV:      Within normal limits  AFI Sum(cm)     %Tile       Largest Pocket(cm)  9.2             7           3.3  RUQ(cm)       RLQ(cm)       LUQ(cm)        LLQ(cm)  1.2           3.3           2.2            2.5 ---------------------------------------------------------------------- Biophysical Evaluation  Amniotic F.V:   Pocket => 2 cm             F. Tone:        Observed  F. Movement:    Observed                   Score:           6/8  F. Breathing:   Not Observed ---------------------------------------------------------------------- Biometry  BPD:      74.1  mm     G. Age:  29w 5d         29  %    CI:        73.23   %  70 - 86                                                          FL/HC:      20.3   %    19.2 - 21.4  HC:      275.2  mm     G. Age:  30w 1d         18  %    HC/AC:      0.99        0.99 - 1.21  AC:      278.2  mm     G. Age:  31w 6d         91  %    FL/BPD:     75.6   %    71 - 87  FL:         56  mm     G. Age:  29w 3d         21  %    FL/AC:      20.1   %    20 - 24  CER:      33.8  mm     G. Age:  28w 5d          8  %  Est. FW:    1637  gm    3 lb 10 oz      65  % ---------------------------------------------------------------------- OB History  Gravidity:    7         Term:   3        Prem:   0        SAB:   3  TOP:          0       Ectopic:  0        Living: 3 ---------------------------------------------------------------------- Gestational Age  U/S Today:     30w 2d                                        EDD:   10/16/20  Best:          30w 0d     Det. By:  U/S  (07/13/20)          EDD:   10/18/20 ---------------------------------------------------------------------- Anatomy  Cranium:               Appears normal         LVOT:                   Not well visualized  Cavum:                 Previously seen        Aortic Arch:            Not well visualized  Ventricles:            Appears normal         Ductal Arch:            Appears normal  Choroid Plexus:        Previously seen        Diaphragm:  Previously seen  Cerebellum:            Appears normal         Stomach:                Appears normal, left                                                                        sided  Posterior Fossa:       Appears normal         Abdomen:                Appears normal  Nuchal Fold:           Not applicable (>16    Abdominal Wall:         Not well visualized                         wks GA)  Face:                   Not well visualized    Cord Vessels:           Appears normal (3                                                                        vessel cord)  Lips:                  Appears normal         Kidneys:                Appear normal  Palate:                Not well visualized    Bladder:                Appears normal  Thoracic:              Appears normal         Spine:                  Not well visualized  Heart:                 Appears normal         Upper Extremities:      Previously seen                         (4CH, axis, and                         situs)  RVOT:                  Appears normal         Lower Extremities:      Previously seen  Other:  Fetus appears to be a female. Technically difficult due to maternal  habitus and fetal position. ---------------------------------------------------------------------- Cervix Uterus Adnexa  Cervix  Not visualized (advanced GA >24wks)  Uterus  No abnormality visualized.  Right Ovary  No adnexal mass visualized.  Left Ovary  No adnexal mass visualized.  Cul De Sac  No free fluid seen.  Adnexa  No abnormality visualized. ---------------------------------------------------------------------- Impression  Fetal growth is appropriate for gestational age .Amniotic fluid  is normal and good fetal activity is seen .Antenatal testing is  reassuring. BPP 8/8. ---------------------------------------------------------------------- Recommendations  -Continue weekly BPP till delivery. ----------------------------------------------------------------------                  Tama High, MD Electronically Signed Final Report   08/09/2020 12:35 pm ----------------------------------------------------------------------   Scheduled Meds: . sodium chloride   Intravenous Once  . acetaminophen  650 mg Oral Q6H  . Chlorhexidine Gluconate Cloth  6 each Topical Daily  . enoxaparin (LOVENOX) injection  40 mg Subcutaneous Q24H  . feeding supplement  237 mL Oral BID BM  .  furosemide  40 mg Oral Daily  . methadone  110 mg Oral Daily  . polyethylene glycol  17 g Oral BID  . prenatal vitamin w/FE, FA  1 tablet Oral Q1200  . senna-docusate  1 tablet Oral BID  . sodium chloride flush  10-40 mL Intracatheter Q12H   Continuous Infusions: . vancomycin 750 mg (08/09/20 1215)     LOS: 27 days    Time spent: 25 mins    Raetta Agostinelli, MD [facility]   If 7PM-7AM, please contact night-coverage

## 2020-08-09 NOTE — TOC Progression Note (Addendum)
Transition of Care (TOC) - Progression Note  Heart Failure  Patient Details  Name: Kelly [name] MRN: [id number] Date of Birth: August 13, 1985  Transition of Care [facility]) CM/SW [place], [place] Phone Number: 08/09/2020, 12:29 PM  Clinical Narrative:    CSW contacted Kelly [name] attending MD Doctor Dwyane Dee who reported that Kelly [name] will probably be staying until she has the baby and that he had a conversation with the OB/GYN and due to Kelly [name] homelessness and IV drug use which would put the baby at risk and by the time she finishes her antibiotics she would be at 33 weeks anyway. CSW informed the patient of this information. Kelly [name] informed CSW that she was accepted into the [facility] and that once she has the baby she can attend this program. CSW attempted to outreach Kelly [name] at the [facility] [phone]) to confirm Kelly [name] admission upon delivery of her baby but was unable to get in touch with anyone and left a voicemail for them to return the call. Kelly [name] asked CSW about getting the baby some things once she delivers and CSW will outreach those contacts Kelly [name] provided and identify resources for Kelly [name] and the baby.  CSW heard back from Kelly [name] at the [facility] who reported that they will staff Kelly [name] case on Monday and will let CSW know once a final decision is made regarding Kelly [name] acceptance into [employer] [facility] [facility].  CSW contacted Kelly [name] worker, Kelly [name] [phone]) who reported she was unsure if they will take the baby upon delivery or not and that she will need to staff with her supervisor and will let CSW know upon further review.   TOC will continue to follow for d/c needs.        Barriers to Discharge: Continued Medical Work WPS Resources with medical needs,Unsafe home situation,Financial Resources,Active Substance Use - Placement  Expected Discharge Plan and Services    In-house Referral: Clinical Social Work Discharge Planning Services: CM Consult                                           Social Determinants of Health (SDOH) Interventions Food Insecurity Interventions: Other (Comment) (Pt. reports having food stamps) Financial Strain Interventions: Intervention Not Indicated,Other (Comment) (CSW working on connecting Pt. to resources) Housing Interventions: Other (Comment) (Referral to outpatient residential treatment program for pregnant women) Transportation Interventions: [facility] Alcohol Brief Interventions/Follow-up: Continued Monitoring (Pt. reports that she hasn't had a drink since her pregnancy) Depression Interventions/Treatment : Counseling,Medication (Pt. reports she has been on medication before and needs to get back on medication and she is open to counseling and meds.)  Readmission Risk Interventions Readmission Risk Prevention Plan 07/30/2018  Transportation Screening Complete  Medication Review Press photographer) Complete  PCP or Specialist appointment within 3-5 days of discharge Complete  HRI or [place] Not Complete  HRI or Home Care Consult Pt Refusal Comments N/A  SW Recovery Care/Counseling Consult Complete  Palliative Care Screening Not Complete  Comments N/A  [place] Not Complete  SNF Comments N/A  Some recent data might be hidden   Decker Cogdell, MSW, LCSWA [phone] Heart Failure Social Worker

## 2020-08-09 NOTE — Progress Notes (Signed)
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)   Tricuspid regurgitation   Supervision of other high risk pregnancies, third trimester   Preexisting hypertension complicating pregnancy, antepartum   Dental caries   Endocarditis   History of VBAC   Lumbar back pain   Unwanted fertility   Acute on chronic congestive heart failure (HCC)   Cellulitis of right lower extremity   Septic embolism (HCC)   Syphilis   [redacted] weeks gestation of pregnancy   Abdominal pain   Drug use affecting pregnancy in second trimester   Methadone maintenance treatment affecting pregnancy ([place])   Maternal endocarditis complicating pregnancy in third trimester   Pain, dental  Antibiotics: Vancomycin 3/2- c                     Total days of antibiotics 27   Lines/Tubes: RT arm PICC line    Interval Events: Remains afebrile, no leukocytosis.  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  MRI lumbar spine with no evidence of discitis and osteomyelitis 29+ weeks of gestation in an IVDU-OB following Medication monitoring-creatinine is WNL    Recommendations Continue vancomycin, pharmacy to dose with previous end date  4/14. Will defer need of repeat TTE post completion of IV antibiotics for assessment of valvulopathy to Cardiology/Cardiothoracic surgery  Monitor CBC and BMP while on IV antibiotics along with Vancomycin trough ID will sign off for now but please call with questions  Rest of the management as per the primary team. Thank you for the consult. Please page with pertinent questions or concerns.  ______________________________________________________________________ Subjective patient seen and examined at the bedside.  She was getting ultrasound of her abdomen.  Did not have any new complaints.  Discussed with her regarding findings and MRI lumbar spine.  Vitals BP 104/61 (BP Location: Left Arm)   Pulse 78   Temp 97.9 F (36.6 C) (Oral)   Resp 16   Ht 5' 7.5" (1.715 m)   Wt 83.5 kg   SpO2 98%   BMI 28.41 kg/m     Physical Exam She was getting ultrasound of her abdomen and appeared to be comfortable   Pertinent Microbiology Results for orders placed or performed during the hospital encounter of 07/13/20  Resp Panel by RT-PCR (Flu A&B, Covid) Nasopharyngeal Swab     Status: None   Collection Time: 07/13/20 11:21 AM   Specimen: Nasopharyngeal Swab; Nasopharyngeal(NP) swabs in vial transport medium  Result Value Ref Range Status   SARS Coronavirus 2 by RT PCR NEGATIVE NEGATIVE Final    Comment: (NOTE) SARS-CoV-2 target nucleic acids are NOT DETECTED.  The SARS-CoV-2 RNA is generally detectable in upper respiratory specimens during the acute phase of infection. The lowest concentration of SARS-CoV-2 viral copies this assay can detect is 138 copies/mL. A negative result does not preclude SARS-Cov-2 infection and should not  be used as the sole basis for treatment or other patient management decisions. A negative result may occur with  improper specimen collection/handling, submission of specimen other than nasopharyngeal swab, presence of viral mutation(s) within the areas targeted  by this assay, and inadequate number of viral copies(<138 copies/mL). A negative result must be combined with clinical observations, patient history, and epidemiological information. The expected result is Negative.  Fact Sheet for Patients:  EntrepreneurPulse.com.au  Fact Sheet for Healthcare Providers:  IncredibleEmployment.be  This test is no t yet approved or cleared by the [place] FDA and  has been authorized for detection and/or diagnosis of SARS-CoV-2 by FDA under an Emergency Use Authorization (EUA). This EUA will remain  in effect (meaning this test can be used) for the duration of the COVID-19 declaration under Section 564(b)(1) of the Act, 21 U.S.C.section 360bbb-3(b)(1), unless the authorization is terminated  or revoked sooner.       Influenza A by PCR NEGATIVE NEGATIVE Final   Influenza B by PCR NEGATIVE NEGATIVE Final    Comment: (NOTE) The Xpert Xpress SARS-CoV-2/FLU/RSV plus assay is intended as an aid in the diagnosis of influenza from Nasopharyngeal swab specimens and should not be used as a sole basis for treatment. Nasal washings and aspirates are unacceptable for Xpert Xpress SARS-CoV-2/FLU/RSV testing.  Fact Sheet for Patients: EntrepreneurPulse.com.au  Fact Sheet for Healthcare Providers: IncredibleEmployment.be  This test is not yet approved or cleared by the [place] FDA and has been authorized for detection and/or diagnosis of SARS-CoV-2 by FDA under an Emergency Use Authorization (EUA). This EUA will remain in effect (meaning this test can be used) for the duration of the COVID-19 declaration under Section 564(b)(1) of the Act, 21 U.S.C. section 360bbb-3(b)(1), unless the authorization is terminated or revoked.  Performed at [facility], [place] [address] [place], [place] [address]   Culture, blood (routine x 2)     Status: Abnormal   Collection Time:  07/13/20 12:20 PM   Specimen: BLOOD  Result Value Ref Range Status   Specimen Description BLOOD SITE NOT SPECIFIED  Final   Special Requests   Final    BOTTLES DRAWN AEROBIC AND ANAEROBIC Blood Culture adequate volume   Culture  Setup Time   Final    GRAM POSITIVE COCCI IN CHAINS IN BOTH AEROBIC AND ANAEROBIC BOTTLES Organism ID to follow CRITICAL RESULT CALLED TO, READ BACK BY AND VERIFIED WITH: Performed at [facility], [place] [address] [place], [place] [address]    Culture METHICILLIN RESISTANT STAPHYLOCOCCUS AUREUS (A)  Final   Report Status 07/16/2020 FINAL  Final   Organism ID, Bacteria METHICILLIN RESISTANT STAPHYLOCOCCUS AUREUS  Final      Susceptibility   Methicillin resistant staphylococcus aureus - MIC*    CIPROFLOXACIN <=0.5 SENSITIVE Sensitive     ERYTHROMYCIN >=8 RESISTANT Resistant     GENTAMICIN <=0.5 SENSITIVE Sensitive     OXACILLIN >=4 RESISTANT Resistant     TETRACYCLINE <=1 SENSITIVE Sensitive     VANCOMYCIN <=0.5 SENSITIVE Sensitive     TRIMETH/SULFA <=10 SENSITIVE Sensitive     CLINDAMYCIN <=0.25 SENSITIVE Sensitive     RIFAMPIN <=0.5 SENSITIVE Sensitive     Inducible Clindamycin NEGATIVE Sensitive     * METHICILLIN RESISTANT STAPHYLOCOCCUS AUREUS  Blood Culture ID Panel (Reflexed)     Status: Abnormal   Collection Time: 07/13/20 12:20 PM  Result Value Ref Range Status   Enterococcus faecalis NOT DETECTED NOT DETECTED Final   Enterococcus Faecium NOT  DETECTED NOT DETECTED Final   Listeria monocytogenes NOT DETECTED NOT DETECTED Final   Staphylococcus species DETECTED (A) NOT DETECTED Final    Comment: CRITICAL RESULT CALLED TO, READ BACK BY AND VERIFIED WITH: PHARMD AMEND CAREN BY MESSAN H. AT 0120 ON 07/14/2020    Staphylococcus aureus (BCID) DETECTED (A) NOT DETECTED Final    Comment: Methicillin (oxacillin)-resistant Staphylococcus aureus (MRSA). MRSA is predictably resistant to beta-lactam antibiotics (except ceftaroline). Preferred therapy is  vancomycin unless clinically contraindicated. Patient requires contact precautions if  hospitalized. CRITICAL RESULT CALLED TO, READ BACK BY AND VERIFIED WITH: PHARMD AMEND CAREN BY MESSAN H. AT 0120 ON 07/14/2020    Staphylococcus epidermidis NOT DETECTED NOT DETECTED Final   Staphylococcus lugdunensis NOT DETECTED NOT DETECTED Final   Streptococcus species NOT DETECTED NOT DETECTED Final   Streptococcus agalactiae NOT DETECTED NOT DETECTED Final   Streptococcus pneumoniae NOT DETECTED NOT DETECTED Final   Streptococcus pyogenes NOT DETECTED NOT DETECTED Final   A.calcoaceticus-baumannii NOT DETECTED NOT DETECTED Final   Bacteroides fragilis NOT DETECTED NOT DETECTED Final   Enterobacterales NOT DETECTED NOT DETECTED Final   Enterobacter cloacae complex NOT DETECTED NOT DETECTED Final   Escherichia coli NOT DETECTED NOT DETECTED Final   Klebsiella aerogenes NOT DETECTED NOT DETECTED Final   Klebsiella oxytoca NOT DETECTED NOT DETECTED Final   Klebsiella pneumoniae NOT DETECTED NOT DETECTED Final   Proteus species NOT DETECTED NOT DETECTED Final   Salmonella species NOT DETECTED NOT DETECTED Final   Serratia marcescens NOT DETECTED NOT DETECTED Final   Haemophilus influenzae NOT DETECTED NOT DETECTED Final   Neisseria meningitidis NOT DETECTED NOT DETECTED Final   Pseudomonas aeruginosa NOT DETECTED NOT DETECTED Final   Stenotrophomonas maltophilia NOT DETECTED NOT DETECTED Final   Candida albicans NOT DETECTED NOT DETECTED Final   Candida auris NOT DETECTED NOT DETECTED Final   Candida glabrata NOT DETECTED NOT DETECTED Final   Candida krusei NOT DETECTED NOT DETECTED Final   Candida parapsilosis NOT DETECTED NOT DETECTED Final   Candida tropicalis NOT DETECTED NOT DETECTED Final   Cryptococcus neoformans/gattii NOT DETECTED NOT DETECTED Final   Meth resistant mecA/C and MREJ DETECTED (A) NOT DETECTED Final    Comment: CRITICAL RESULT CALLED TO, READ BACK BY AND VERIFIED  WITH: PHARMD AMEND CAREN BY MESSAN H. AT 0120 ON 07/14/2020 Performed at [facility], [address], [place], [place] [address]   Culture, blood (routine x 2)     Status: Abnormal   Collection Time: 07/13/20 12:21 PM   Specimen: BLOOD  Result Value Ref Range Status   Specimen Description BLOOD SITE NOT SPECIFIED  Final   Special Requests   Final    BOTTLES DRAWN AEROBIC AND ANAEROBIC Blood Culture adequate volume   Culture  Setup Time   Final    GRAM POSITIVE COCCI IN BOTH AEROBIC AND ANAEROBIC BOTTLES CRITICAL VALUE NOTED.  VALUE IS CONSISTENT WITH PREVIOUSLY REPORTED AND CALLED VALUE.    Culture (A)  Final    STAPHYLOCOCCUS AUREUS SUSCEPTIBILITIES PERFORMED ON PREVIOUS CULTURE WITHIN THE LAST 5 DAYS. Performed at [facility], [place] [address] [place], [place] [address]    Report Status 07/16/2020 FINAL  Final  MRSA PCR Screening     Status: Abnormal   Collection Time: 07/13/20  8:37 PM   Specimen: Nasal Mucosa; Nasopharyngeal  Result Value Ref Range Status   MRSA by PCR POSITIVE (A) NEGATIVE Final    Comment:        The  GeneXpert MRSA Assay (FDA approved for NASAL specimens only), is one component of a comprehensive MRSA colonization surveillance program. It is not intended to diagnose MRSA infection nor to guide or monitor treatment for MRSA infections. RESULT CALLED TO, READ BACK BY AND VERIFIED WITH: TOLER,M RN 07/13/2020 AT 2319 SKEEN,P Performed at [facility], [place] [address], [place], [place] [address]   Urine culture     Status: None   Collection Time: 07/13/20 10:10 PM   Specimen: Urine, Clean Catch  Result Value Ref Range Status   Specimen Description URINE, CLEAN CATCH  Final   Special Requests NONE  Final   Culture   Final    NO GROWTH Performed at [facility], [address] [place], [place] [address]    Report Status 07/15/2020 FINAL  Final  Culture, blood (routine x 2)     Status: None   Collection Time: 07/15/20  6:49 PM    Specimen: BLOOD  Result Value Ref Range Status   Specimen Description BLOOD RIGHT ANTECUBITAL  Final   Special Requests   Final    BOTTLES DRAWN AEROBIC AND ANAEROBIC BACTEROIDES CACCAE   Culture   Final    NO GROWTH 5 DAYS Performed at [facility], [place] [address] [place], [place] [address]    Report Status 07/20/2020 FINAL  Final  Culture, blood (routine x 2)     Status: None   Collection Time: 07/15/20  6:50 PM   Specimen: BLOOD  Result Value Ref Range Status   Specimen Description BLOOD BLOOD RIGHT FOREARM  Final   Special Requests   Final    BOTTLES DRAWN AEROBIC AND ANAEROBIC Blood Culture adequate volume   Culture   Final    NO GROWTH 5 DAYS Performed at [facility], [place] [address], [place], [place] [address]    Report Status 07/20/2020 FINAL  Final    Pertinent Lab. CBC Latest Ref Rng & Units 08/07/2020 08/06/2020 08/05/2020  WBC 4.0 - 10.5 K/uL - - -  Hemoglobin 12.0 - 15.0 g/dL 8.2(L) 8.3(L) 8.3(L)  Hematocrit 36.0 - 46.0 % 25.5(L) 26.0(L) 26.1(L)  Platelets 150 - 400 K/uL - - -   CMP Latest Ref Rng & Units 08/09/2020 08/07/2020 08/05/2020  Glucose 70 - 99 mg/dL 88 110(H) 89  BUN 6 - 20 mg/dL 12 11 13   Creatinine 0.44 - 1.00 mg/dL 0.49 0.56 0.47  Sodium 135 - 145 mmol/L 132(L) 132(L) 131(L)  Potassium 3.5 - 5.1 mmol/L 3.8 3.7 4.0  Chloride 98 - 111 mmol/L 99 100 99  CO2 22 - 32 mmol/L 25 24 25   Calcium 8.9 - 10.3 mg/dL 9.0 8.9 9.0  Total Protein 6.5 - 8.1 g/dL - - -  Total Bilirubin 0.3 - 1.2 mg/dL - - -  Alkaline Phos 38 - 126 U/L - - -  AST 15 - 41 U/L - - -  ALT 0 - 44 U/L - - -     Pertinent Imaging today Plain films and CT images have been personally visualized and interpreted; radiology reports have been reviewed. Decision making incorporated into the Impression / Recommendations.  MRI lumbar spine 08/08/2020 FINDINGS: Segmentation:  Standard.  Alignment:  Levoconvex scoliosis.  Vertebrae: Postsurgical change from spinal fixation  extending from the visualized lower thoracic spine to the L3 level. There is prominent susceptibility artifact from surgical hardware degrading the images at this levels. No signal changes within the intervertebral discs or marrow edema identified to suggest  discitis/osteomyelitis. No acute fracture or aggressive bone lesion identified.  Conus medullaris and cauda equina: Conus medullaris is obscured by susceptibility artifact.  Paraspinal and other soft tissues: Intrauterine pregnancy is incidentally seen.  Disc levels:  T12-L1 through L3-4: No significant disc herniation, spinal canal or neural foraminal stenosis.  L4-5: Postsurgical changes from prior posterior decompression. Loss of disc height, disc bulge and moderate facet degenerative changes, left greater than right without significant spinal canal or neural foraminal stenosis.  L5-S1: Postsurgical changes from prior posterior decompression. Loss of disc height, disc bulge and moderate facet degenerative changes, left greater than right, resulting in mild-to-moderate left neural foraminal narrowing which may impinge on the exiting left L5 nerve root. No spinal canal stenosis.  IMPRESSION: 1. Postsurgical changes from prior posterior decompression at L4-5 and L5-S1. 2. Mild to moderate left neural foraminal narrowing at L5-S1 which may impinge on the exiting left L5 nerve root. 3. No significant spinal canal stenosis at any level. 4. No evidence of discitis/osteomyelitis.   I have spent approx 30 minutes for this patient encounter including review of prior medical records, coordination of care  with greater than 50% of time being face to face/counseling and discussing diagnostics/treatment plan with the patient/family.  Electronically signed by:   Rosiland Oz, MD Infectious Disease Physician [facility] for Infectious Disease Pager: [phone]

## 2020-08-09 NOTE — Progress Notes (Signed)
Daily NST initiated.  Good fetal movement.  No leaking or bleeding.  No OB issues at this time.

## 2020-08-10 DIAGNOSIS — R7881 Bacteremia: Secondary | ICD-10-CM | POA: Diagnosis not present

## 2020-08-10 DIAGNOSIS — B9562 Methicillin resistant Staphylococcus aureus infection as the cause of diseases classified elsewhere: Secondary | ICD-10-CM | POA: Diagnosis not present

## 2020-08-10 LAB — HEMOGLOBIN AND HEMATOCRIT, BLOOD
HCT: 24.7 % — ABNORMAL LOW (ref 36.0–46.0)
Hemoglobin: 8.2 g/dL — ABNORMAL LOW (ref 12.0–15.0)

## 2020-08-10 LAB — CREATININE, SERUM
Creatinine, Ser: 0.57 mg/dL (ref 0.44–1.00)
GFR, Estimated: >60 mL/min (ref >60)

## 2020-08-10 LAB — VANCOMYCIN, TROUGH: Vancomycin Tr: 39 ug/mL (ref 15–20)

## 2020-08-10 NOTE — Progress Notes (Signed)
Pharmacy Antibiotic Note  Kelly [name] is a 35 y.o. female with MRSA bacteremia  And TV endocarditis. 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  Pharmacy has been consulted for vancomycin. Patient is pregnant with EGA of [redacted] weeks.  Scr remains stable at 0.57 (CrCl >100 mL/min). Remains afebrile. Planning 6 weeks of inpatient IV antibiotics. Patient clinically improving. Most recent vancomycin levels on 3/24 appropriate- order weekly VT today, was drawn after vancomycin dose was started - will disregard result.   Plan: Continue vancomycin to 750 mg IV q 8 hrs >> will order level for 3/31@1200  prior to dose scheduled at 1300 Monitor C&S, clinical status, renal function, VT as needed Plan for levels as needed, most likely early next week    Temp (24hrs), Avg:98 F (36.7 C), Min:97.7 F (36.5 C), Max:98.4 F (36.9 C)  Recent Labs  Lab 08/04/20 0409 08/04/20 0410 08/04/20 1018 08/05/20 0328 08/07/20 0329 08/09/20 0205 08/10/20 0258 08/10/20 1236  CREATININE  --  0.50  --  0.47 0.56 0.49 0.57  --   VANCOTROUGH  --   --  11*  --   --   --   --  39*  VANCOPEAK 28*  --   --   --   --   --   --   --     Estimated Creatinine Clearance: 110.3 mL/min (by C-G formula based on SCr of 0.57 mg/dL).    Allergies  Allergen Reactions  . Hydrocodone Itching  . Morphine And Related Nausea And Vomiting    Antimicrobials this admission: Azith 3/2 x1 CTX 3/2 x1 Vancomycin 3/2> Cefepime 3/2> 3/3  Cultures: 3/2 urine - ng 3/2 Bld - GPC - MRSA 3/2 MRSA PCR pos 3/4 BCx x 2 > Neg  Thank you for allowing pharmacy to be a part of this patient's care.  Antonietta Jewel, PharmD, [place] Clinical Pharmacist  Phone: [phone] 08/10/2020 2:12 PM  Please check AMION for all [place] phone numbers After 10:00 PM, call [place] [phone]

## 2020-08-10 NOTE — Progress Notes (Signed)
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 -Current active IV heroin user, uses about 3 g daily and IV fentanyl, presented to the ED with 3 to 4 weeks history of bilateral lower extremity edema associated with worsening dyspnea on exertion for 4 days, intermittent fever for several weeks and pain with deep inspiration across her right lateral chest and flank. -In the ED she was noted to have fluid overload, blood cultures were positive for MRSA and she was a started on IV vancomycin. -2D echo without obvious vegetation, TEE was deferred and plan is to treat empirically for endocarditis (6 weeks of vancomycin) -Patient was also found to be [redacted] weeks Pregnant.Currently 30 weeks pregnancy.She will remain inpatient to complete 6 weeks of IV antibiotics.   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)   Tricuspid regurgitation   Supervision of other high risk pregnancies, third trimester   Preexisting hypertension complicating pregnancy, antepartum   Dental caries   Endocarditis   History of VBAC   Lumbar back pain   Unwanted fertility   Acute on chronic congestive heart failure (HCC)   Cellulitis of right lower extremity   Septic embolism (HCC)    Syphilis   [redacted] weeks gestation of pregnancy   Abdominal pain   Drug use affecting pregnancy in second trimester   Methadone maintenance treatment affecting pregnancy ([place])   Maternal endocarditis complicating pregnancy in third trimester   Pain, dental   30 weeks pregnancy : -Management per primary service, OB -She will be discharged when she completes the IV antibiotics that will be on 4/14. -Had detailed conversation with OB/GYN today.  MRSA bacteremia with presumed endocarditis : -Per ID recommendations, continue IV vancomycin until 4/14. -2D echo without obvious vegetations, TEE deferred as empirically treating this as endocarditis. -Repeat blood cultures so far negative. -On IV vancomycin x 6 weeks, remains afebrile. -She will remain in hospital to complete 6 weeks of IV antibiotics.  Acute on chronic right-sided heart failure with severe TR, severe pulmonary hypertension -2D echo showed decreased RV systolic function, elevated PA pressure 92, severe TR -Echo 3/23: LVEF 50-55%, [place] echodensity likely representing vegetation measuring 1cm x  0.6 cm seen on anterior leaflet of tricuspid valve. -She is being followed by cardiology/CHF team, transition to Lasix oral on 3/12  -Negative balance of 8.7 L, continue Lasix 40 mg PO daily.  Anemia of chronic disease - Baseline hemoglobin 8.5-9.5 - B12 445, folate 7.4, iron 32, ferritin 111 - Continue prenatal vitamins, H&H stable, 8.3  History of IV heroin and fentanyl abuse, chronic pain syndrome -Patient now started on methadone 100 mg daily on 3/15, she has previously tolerated 100 mg of methadone daily -Dilaudid decreased to 0.5- 1 mg every 4 hours as needed for severe/breakthrough pain -HCV antibody+, hep A, hep B negative -Currently no complaints, feels  back to her baseline, denies nausea, vomiting. -She reports pain is not controlled, she was on 120 mg of methadone on last admission. -Discussed with OB/GYN, methadone  dose increased to 110 milligrams daily  Pneumonia, possibly septic pulmonary infarcts; -Continue IV vancomycin.  Syphilis -Positive RPR with rash per ID, could be secondary syphilis -Penicillin 2.4MU IM given  Hyponatremia -Stable,132 >131>132  Back pain -Unable to tolerate MRI, was discontinued, currently on IV antibiotics. - MRI [place] spine ordered, will give low dose ativan before MRI - MRI L spine : No evidence of discitis/osteomyelitis.   Jaw pain, tooth pain, dental caries -Placed on Augmentin, multiple caries, outpatient follow-up with [facility]    DVT prophylaxis: Lovenox Code Status: Full code. Family Communication: No family at bed side. Disposition Plan:  Status is: Inpatient  Remains inpatient appropriate because:Inpatient level of care appropriate due to severity of illness   Dispo: The patient is from: Home              Anticipated d/c is to: Home              Patient currently is not medically stable to d/c.   Difficult to place patient No   She will remain in the hospital to complete IV antibiotics for 6 weeks. 08/25/20  Consultants:   Cardiology, infectious diseases, cardiothoracic surgery  Procedures:   Antimicrobials:  Anti-infectives (From admission, onward)   Start     Dose/Rate Route Frequency Ordered Stop   07/22/20 1045  amoxicillin-clavulanate (AUGMENTIN) 875-125 MG per tablet 1 tablet        1 tablet Oral Every 12 hours 07/22/20 0952 07/31/20 2023   07/18/20 0830  vancomycin (VANCOCIN) IVPB 750 mg/150 ml premix        750 mg 150 mL/hr over 60 Minutes Intravenous Every 8 hours 07/18/20 0750     07/16/20 0400  Vancomycin (VANCOCIN) 1,250 mg in sodium chloride 0.9 % 250 mL IVPB  Status:  Discontinued        1,250 mg 166.7 mL/hr over 90 Minutes Intravenous Every 8 hours 07/15/20 1944 07/18/20 0749   07/15/20 1715  penicillin g benzathine (BICILLIN LA) 1200000 UNIT/2ML injection 2.4 Million Units        2.4 Million  Units Intramuscular  Once 07/15/20 1621 07/15/20 1747   07/14/20 0300  vancomycin (VANCOCIN) IVPB 1000 mg/200 mL premix        1,000 mg 200 mL/hr over 60 Minutes Intravenous Every 8 hours 07/13/20 1806 07/15/20 2059   07/13/20 1730  Vancomycin (VANCOCIN) 1,500 mg in sodium chloride 0.9 % 500 mL IVPB        1,500 mg 250 mL/hr over 120 Minutes Intravenous  Once 07/13/20 1709 07/13/20 2116   07/13/20 1730  ceFEPIme (MAXIPIME) 2 g in sodium chloride 0.9 % 100 mL IVPB  Status:  Discontinued        2 g 200 mL/hr over 30 Minutes Intravenous Every 8 hours 07/13/20 1709 07/14/20 1050   07/13/20 1315  cefTRIAXone (ROCEPHIN) 1 g in sodium chloride 0.9 % 100 mL IVPB        1 g 200 mL/hr over 30 Minutes Intravenous  Once 07/13/20 1306 07/13/20 1538   07/13/20 1315  azithromycin (ZITHROMAX) 500 mg in sodium chloride 0.9 % 250 mL IVPB        500 mg 250 mL/hr over 60 Minutes Intravenous  Once 07/13/20 1306 07/13/20 1644      Subjective: Patient was seen and examined at bedside.  Overnight events noted.   She is lying comfortably in the bed. She reports feeling better. Patient denies any shortness of breath and chest pain.  She reports fetal movements.  Objective: Vitals:   08/09/20 1703 08/09/20 2337 08/10/20 0514 08/10/20 1251  BP: 108/75 99/68 96/65  111/77  Pulse: 92 85 79 85  Resp: 18 19 18 16   Temp: 97.9 F (36.6 C) 98.4 F (36.9 C) 97.7 F (36.5 C) 98 F (36.7 C)  TempSrc: Oral Oral Oral Oral  SpO2: 100% 98% 98% 95%  Weight:   83.7 kg   Height:        Intake/Output Summary (Last 24 hours) at 08/10/2020 1639 Last data filed at 08/10/2020 0600 Gross per 24 hour  Intake 500 ml  Output --  Net 500 ml   Filed Weights   08/08/20 0509 08/09/20 0535 08/10/20 0514  Weight: 84.1 kg 83.5 kg 83.7 kg    Examination:  General exam: Appears calm and comfortable, not in any acute distress Respiratory system: Clear to auscultation. Respiratory effort normal. Cardiovascular system: S1 & S2  heard, RRR. No JVD, murmurs, rubs, gallops or clicks. No pedal edema. Gastrointestinal system: Abdomen is nondistended, soft and nontender. No organomegaly or masses felt.  Normal bowel sounds heard. Central nervous system: Alert and oriented. No focal neurological deficits. Extremities: Symmetric 5 x 5 power. No edema, no cyanosis, no clubbing. Skin: No rashes, lesions or ulcers Psychiatry: Judgement and insight appear normal. Mood & affect appropriate.     Data Reviewed: I have personally reviewed following labs and imaging studies  CBC: Recent Labs  Lab 08/05/20 0328 08/06/20 0327 08/07/20 0329 08/10/20 0258  HGB 8.3* 8.3* 8.2* 8.2*  HCT 26.1* 26.0* 25.5* 54.6*   Basic Metabolic Panel: Recent Labs  Lab 08/04/20 0410 08/05/20 0328 08/07/20 0329 08/09/20 0205 08/10/20 0258  NA 132* 131* 132* 132*  --   K 3.8 4.0 3.7 3.8  --   CL 98 99 100 99  --   CO2 27 25 24 25   --   GLUCOSE 89 89 110* 88  --   BUN 12 13 11 12   --   CREATININE 0.50 0.47 0.56 0.49 0.57  CALCIUM 9.0 9.0 8.9 9.0  --   MG 1.6* 1.8  --   --   --   PHOS 4.4 4.2  --   --   --    GFR: Estimated Creatinine Clearance: 110.3 mL/min (by C-G formula based on SCr of 0.57 mg/dL). Liver Function Tests: No results for input(s): AST, ALT, ALKPHOS, BILITOT, PROT, ALBUMIN in the last 168 hours. No results for input(s): LIPASE, AMYLASE in the last 168 hours. No results for input(s): AMMONIA in the last 168 hours. Coagulation Profile: No results for input(s): INR, PROTIME in the last 168 hours. Cardiac Enzymes: No results for input(s): CKTOTAL, CKMB, CKMBINDEX, TROPONINI in the last 168 hours. BNP (last 3 results) No results for input(s): PROBNP in the last 8760 hours. HbA1C: No results for input(s): HGBA1C in the last 72 hours. CBG: Recent Labs  Lab 08/04/20 1248 08/04/20 1635 08/04/20 2030 08/05/20 0552 08/05/20 1046  GLUCAP 94 109* 90 95 80   Lipid Profile: No results for input(s): CHOL, HDL,  LDLCALC, TRIG, CHOLHDL, LDLDIRECT in the last 72 hours. Thyroid Function Tests: No results for input(s): TSH, T4TOTAL, FREET4, T3FREE, THYROIDAB in the last 72 hours. Anemia Panel: No results for input(s): VITAMINB12, FOLATE, FERRITIN, TIBC, IRON, RETICCTPCT in the last 72 hours. Sepsis Labs: No results for  input(s): PROCALCITON, LATICACIDVEN in the last 168 hours.  No results found for this or any previous visit (from the past 240 hour(s)).   Radiology Studies: [place] MFM FETAL BPP WO NON STRESS  Result Date: 08/09/2020 ----------------------------------------------------------------------  OBSTETRICS REPORT                       (Signed Final 08/09/2020 12:35 pm) ---------------------------------------------------------------------- Patient Info  ID #:       [id number]                          D.O.B.:  April 30, 1986 (34 yrs)  Name:       [name]              Visit Date: 08/09/2020 09:09 am ---------------------------------------------------------------------- Performed By  Attending:        Tama High MD        Ref. Address:     [place]  Performed By:     Valda Favia          [facility]                    [place]  Referred By:      Sloan Leiter                    MD ---------------------------------------------------------------------- Orders  #  Description                           Code        Ordered By  1  [place] MFM FETAL BPP WO NON               76819.01    [place]  2  [place] MFM OB FOLLOW UP                   61443.15    [place] ----------------------------------------------------------------------  #  Order #                     Accession #                Episode #  1  [id number]                   [id number]                 [id number]  2  [id number]                   [id number]                 [id number]  ---------------------------------------------------------------------- Indications  Hypertension - Chronic/Pre-existing  O10.019  [redacted] weeks gestation of pregnancy                Z3A.8  Heart disease in mother affecting pregnancy    O99.413  in third trimester  Drug use complicating pregnancy, third         O66.323  trimester  Advanced maternal age multigravida 54+,        O39.523  third trimester  Previous cesarean delivery, antepartum x1      O34.219  Heart disease in mother affecting pregnancy    O99.413  in third trimester ---------------------------------------------------------------------- Fetal Evaluation  Num Of Fetuses:         1  Fetal Heart Rate(bpm):  137  Cardiac Activity:       Observed  Presentation:           Transverse, head to maternal left  Placenta:               Anterior  P. Cord Insertion:      Visualized  Amniotic Fluid  AFI FV:      Within normal limits  AFI Sum(cm)     %Tile       Largest Pocket(cm)  9.2             7           3.3  RUQ(cm)       RLQ(cm)       LUQ(cm)        LLQ(cm)  1.2           3.3           2.2            2.5 ---------------------------------------------------------------------- Biophysical Evaluation  Amniotic F.V:   Pocket => 2 cm             F. Tone:        Observed  F. Movement:    Observed                   Score:          6/8  F. Breathing:   Not Observed ---------------------------------------------------------------------- Biometry  BPD:      74.1  mm     G. Age:  29w 5d         29  %    CI:        73.23   %    70 - 86                                                          FL/HC:      20.3   %    19.2 - 21.4  HC:      275.2  mm     G. Age:  30w 1d         18  %    HC/AC:      0.99        0.99 - 1.21  AC:      278.2  mm     G. Age:  31w 6d         91  %    FL/BPD:     75.6   %    71 - 87  FL:  56  mm     G. Age:  29w 3d         21  %    FL/AC:      20.1   %    20 - 24  CER:      33.8  mm     G. Age:  28w 5d          8  %  Est. FW:    1637  gm     3 lb 10 oz      65  % ---------------------------------------------------------------------- OB History  Gravidity:    7         Term:   3        Prem:   0        SAB:   3  TOP:          0       Ectopic:  0        Living: 3 ---------------------------------------------------------------------- Gestational Age  U/S Today:     30w 2d                                        EDD:   10/16/20  Best:          30w 0d     Det. By:  U/S  (07/13/20)          EDD:   10/18/20 ---------------------------------------------------------------------- Anatomy  Cranium:               Appears normal         LVOT:                   Not well visualized  Cavum:                 Previously seen        Aortic Arch:            Not well visualized  Ventricles:            Appears normal         Ductal Arch:            Appears normal  Choroid Plexus:        Previously seen        Diaphragm:              Previously seen  Cerebellum:            Appears normal         Stomach:                Appears normal, left                                                                        sided  Posterior Fossa:       Appears normal         Abdomen:                Appears normal  Nuchal Fold:           Not applicable (>29    Abdominal Wall:  Not well visualized                         wks GA)  Face:                  Not well visualized    Cord Vessels:           Appears normal (3                                                                        vessel cord)  Lips:                  Appears normal         Kidneys:                Appear normal  Palate:                Not well visualized    Bladder:                Appears normal  Thoracic:              Appears normal         Spine:                  Not well visualized  Heart:                 Appears normal         Upper Extremities:      Previously seen                         (4CH, axis, and                         situs)  RVOT:                  Appears normal         Lower Extremities:       Previously seen  Other:  Fetus appears to be a female. Technically difficult due to maternal          habitus and fetal position. ---------------------------------------------------------------------- Cervix Uterus Adnexa  Cervix  Not visualized (advanced GA >24wks)  Uterus  No abnormality visualized.  Right Ovary  No adnexal mass visualized.  Left Ovary  No adnexal mass visualized.  Cul De Sac  No free fluid seen.  Adnexa  No abnormality visualized. ---------------------------------------------------------------------- Impression  Fetal growth is appropriate for gestational age .Amniotic fluid  is normal and good fetal activity is seen .Antenatal testing is  reassuring. BPP 8/8. ---------------------------------------------------------------------- Recommendations  -Continue weekly BPP till delivery. ----------------------------------------------------------------------                  Tama High, MD Electronically Signed Final Report   08/09/2020 12:35 pm ----------------------------------------------------------------------  [place] MFM OB FOLLOW UP  Result Date: 08/09/2020 ----------------------------------------------------------------------  OBSTETRICS REPORT                       (Signed Final 08/09/2020 12:35 pm) ---------------------------------------------------------------------- Patient Info  ID #:  [id number]                          D.O.B.:  [birth year] (34 yrs)  Name:       FAIZAH KANDLER              Visit Date: 08/09/2020 09:09 am ---------------------------------------------------------------------- Performed By  Attending:        Tama High MD        Ref. Address:     [place]  Performed By:     Valda Favia          Location[facility]         [facility]                    [place]  Referred By:      Sloan Leiter                    MD  ---------------------------------------------------------------------- Orders  #  Description                           Code        Ordered By  1  [place] MFM FETAL BPP WO NON               76819.01    [place]  2  [place] MFM OB FOLLOW UP                   62229.79    [place] ----------------------------------------------------------------------  #  Order #                     Accession #                Episode #  1  [id number]                   [id number]                 [id number]  2  [id number]                   [id number]                 [id number] ---------------------------------------------------------------------- Indications  Hypertension - Chronic/Pre-existing            O10.019  [redacted] weeks gestation of pregnancy                Z3A.37  Heart disease in mother affecting pregnancy    O99.413  in third trimester  Drug use complicating pregnancy, third         O89.323  trimester  Advanced maternal age multigravida 24+,        O29.523  third trimester  Previous cesarean delivery, antepartum x1  O34.219  Heart disease in mother affecting pregnancy    O99.413  in third trimester ---------------------------------------------------------------------- Fetal Evaluation  Num Of Fetuses:         1  Fetal Heart Rate(bpm):  137  Cardiac Activity:       Observed  Presentation:           Transverse, head to maternal left  Placenta:               Anterior  P. Cord Insertion:      Visualized  Amniotic Fluid  AFI FV:      Within normal limits  AFI Sum(cm)     %Tile       Largest Pocket(cm)  9.2             7           3.3  RUQ(cm)       RLQ(cm)       LUQ(cm)        LLQ(cm)  1.2           3.3           2.2            2.5 ---------------------------------------------------------------------- Biophysical Evaluation  Amniotic F.V:   Pocket => 2 cm             F. Tone:        Observed  F. Movement:    Observed                   Score:          6/8  F. Breathing:   Not Observed  ---------------------------------------------------------------------- Biometry  BPD:      74.1  mm     G. Age:  29w 5d         29  %    CI:        73.23   %    70 - 86                                                          FL/HC:      20.3   %    19.2 - 21.4  HC:      275.2  mm     G. Age:  30w 1d         18  %    HC/AC:      0.99        0.99 - 1.21  AC:      278.2  mm     G. Age:  31w 6d         91  %    FL/BPD:     75.6   %    71 - 87  FL:         56  mm     G. Age:  29w 3d         21  %    FL/AC:      20.1   %    20 - 24  CER:      33.8  mm     G. Age:  28w 5d          8  %  Est. FW:    1637  gm    3 lb 10 oz      65  % ---------------------------------------------------------------------- OB History  Gravidity:    7         Term:   3        Prem:   0        SAB:   3  TOP:          0       Ectopic:  0        Living: 3 ---------------------------------------------------------------------- Gestational Age  U/S Today:     30w 2d                                        EDD:   10/16/20  Best:          30w 0d     Det. By:  U/S  (07/13/20)          EDD:   10/18/20 ---------------------------------------------------------------------- Anatomy  Cranium:               Appears normal         LVOT:                   Not well visualized  Cavum:                 Previously seen        Aortic Arch:            Not well visualized  Ventricles:            Appears normal         Ductal Arch:            Appears normal  Choroid Plexus:        Previously seen        Diaphragm:              Previously seen  Cerebellum:            Appears normal         Stomach:                Appears normal, left                                                                        sided  Posterior Fossa:       Appears normal         Abdomen:                Appears normal  Nuchal Fold:           Not applicable (>09    Abdominal Wall:         Not well visualized                         wks GA)  Face:                  Not well visualized    Cord  Vessels:           Appears normal (3  vessel cord)  Lips:                  Appears normal         Kidneys:                Appear normal  Palate:                Not well visualized    Bladder:                Appears normal  Thoracic:              Appears normal         Spine:                  Not well visualized  Heart:                 Appears normal         Upper Extremities:      Previously seen                         (4CH, axis, and                         situs)  RVOT:                  Appears normal         Lower Extremities:      Previously seen  Other:  Fetus appears to be a female. Technically difficult due to maternal          habitus and fetal position. ---------------------------------------------------------------------- Cervix Uterus Adnexa  Cervix  Not visualized (advanced GA >24wks)  Uterus  No abnormality visualized.  Right Ovary  No adnexal mass visualized.  Left Ovary  No adnexal mass visualized.  Cul De Sac  No free fluid seen.  Adnexa  No abnormality visualized. ---------------------------------------------------------------------- Impression  Fetal growth is appropriate for gestational age .Amniotic fluid  is normal and good fetal activity is seen .Antenatal testing is  reassuring. BPP 8/8. ---------------------------------------------------------------------- Recommendations  -Continue weekly BPP till delivery. ----------------------------------------------------------------------                  Tama High, MD Electronically Signed Final Report   08/09/2020 12:35 pm ----------------------------------------------------------------------   Scheduled Meds: . sodium chloride   Intravenous Once  . acetaminophen  650 mg Oral Q6H  . Chlorhexidine Gluconate Cloth  6 each Topical Daily  . enoxaparin (LOVENOX) injection  40 mg Subcutaneous Q24H  . feeding supplement  237 mL Oral BID BM  . furosemide  40 mg Oral Daily  .  methadone  110 mg Oral Daily  . polyethylene glycol  17 g Oral BID  . prenatal vitamin w/FE, FA  1 tablet Oral Q1200  . senna-docusate  1 tablet Oral BID  . sodium chloride flush  10-40 mL Intracatheter Q12H   Continuous Infusions: . vancomycin 750 mg (08/10/20 1201)     LOS: 28 days    Time spent: 25 mins    Kaori Jumper, MD [facility]   If 7PM-7AM, please contact night-coverage

## 2020-08-10 NOTE — Progress Notes (Signed)
NST reassuring for current gestation.  Pt reports vomiting this morning, but says she feels better now.  No LOF or Vag Bleeding.  Fetal movement noted.  Dr Ernestina Patches updated and shown NST.

## 2020-08-10 NOTE — Progress Notes (Signed)
Date and time results received: 08/10/20   Test: Vanc Troph Critical Value: 39  Name of Provider Notified: Dr. Dwyane Dee  Orders Received? Awaiting further instruction

## 2020-08-10 NOTE — TOC Progression Note (Addendum)
Transition of Care (TOC) - Progression Note  Heart Failure   Patient Details  Name: Kelly [name] MRN: [id number] Date of Birth: [birth year]  Transition of Care [facility]) CM/SW [place], [place] Phone Number: 08/10/2020, 2:12 PM  Clinical Narrative:    CSW spoke with [name] at bedside and discussed her possible options between now and when she delivers the baby. [name] reported that she would be able to go back to the Rooms at the [name] if she is discharged once she finishes the IV antibiotics. [name] reported that they will throw her a baby shower and get her everything she needs before the baby comes and they will drug test her there also. [name] reported that if she is discharged she would be able to see her 54 year old daughter before giving birth and she would like that as she isn't sure that would happen if she stays at the hospital until she gives birth.  After leaving patients room CSW spoke with Dr. Nelda Marseille who reported that [name] would most likely be discharged upon finishing the IV antibiotics and that she will inform [name] of this information.   CSW was asked by Ms. Jungman to come back to the room as [name] with Rooms at the [facility] came to see her and would like the CSW to speak with her about the program and getting her connected upon discharge.  TOC will continue to follow for d/c needs.      Barriers to Discharge: Continued Medical Work [employer]PS Resources with medical needs,Unsafe home situation,Financial Resources,Active Substance Use - Placement  Expected Discharge Plan and Services   In-house Referral: Clinical Social Work Discharge Planning Services: CM Consult                                           Social Determinants of Health (SDOH) Interventions Food Insecurity Interventions: Other (Comment) (Pt. reports having food stamps) Financial Strain Interventions: Intervention Not Indicated,Other (Comment) (CSW working  on connecting Pt. to resources) Housing Interventions: Other (Comment) (Referral to outpatient residential treatment program for pregnant women) Transportation Interventions: [facility] Alcohol Brief Interventions/Follow-up: Continued Monitoring (Pt. reports that she hasn't had a drink since her pregnancy) Depression Interventions/Treatment : Counseling,Medication (Pt. reports she has been on medication before and needs to get back on medication and she is open to counseling and meds.)  Readmission Risk Interventions Readmission Risk Prevention Plan 07/30/2018  Transportation Screening Complete  Medication Review Press photographer) Complete  PCP or Specialist appointment within 3-5 days of discharge Complete  HRI or [place] Not Complete  HRI or Home Care Consult Pt Refusal Comments N/A  SW Recovery Care/Counseling Consult Complete  Palliative Care Screening Not Complete  Comments N/A  [place] Not Complete  SNF Comments N/A  Some recent data might be hidden   [name], MSW, LCSWA [phone] Heart Failure Social Worker

## 2020-08-10 NOTE — Progress Notes (Signed)
Nutrition Follow-up  DOCUMENTATION CODES:   Not applicable  INTERVENTION:   Continue to offer Ensure Enlive po BID, each supplement provides 350 kcal and 20 grams of protein.  Continue snacks between meals.   Encourage adequate PO intake.   Continue prenatal vitamin once daily.   NUTRITION DIAGNOSIS:   Increased nutrient needs related to chronic illness (heart failure) as evidenced by estimated needs.  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 Pt noted to be fluid overload in addition blood cultures were positive for MRSA. Pt found to be pregnant.   Pt is currently [redacted] weeks pregnant.  Plans for 6 weeks of IV antibiotics for MRSA sepsis, suspected recurrent endocarditis.  Patient to remain inpatient until IV antibiotics are completed.   Remains on a regular diet; being offered Ensure Enlive/Plus BID between meals as well as snacks TID between meals. Meal intakes documented at 100%.  Patient reports good appetite and good intake of meals. She is drinking Ensure supplements twice daily on most days.   Labs and medications reviewed.   Diet Order:   Diet Order            Diet regular Room service appropriate? Yes; Fluid consistency: Thin  Diet effective now                 EDUCATION NEEDS:   Education  needs have been addressed  Skin:  Skin Assessment: Reviewed RN Assessment  Last BM:  3/29  Height:   Ht Readings from Last 1 Encounters:  07/13/20 5' 7.5" (1.715 m)    Weight:   Wt Readings from Last 1 Encounters:  08/10/20 83.7 kg   BMI:  Body mass index is 28.49 kg/m.  Estimated Nutritional Needs:   Kcal:  2100-2300  Protein:  105-115 grams  Fluid:  >/= 2 L/day   Lucas Mallow, RD, LDN, CNSC Please refer to Amion for contact information.

## 2020-08-11 DIAGNOSIS — B9562 Methicillin resistant Staphylococcus aureus infection as the cause of diseases classified elsewhere: Secondary | ICD-10-CM | POA: Diagnosis not present

## 2020-08-11 DIAGNOSIS — R7881 Bacteremia: Secondary | ICD-10-CM | POA: Diagnosis not present

## 2020-08-11 LAB — VANCOMYCIN, TROUGH: Vancomycin Tr: 16 ug/mL (ref 15–20)

## 2020-08-11 MED ORDER — CEFAZOLIN SODIUM-DEXTROSE 1-4 GM/50ML-% IV SOLN
1.0000 g | INTRAVENOUS | Status: DC
Start: 2020-08-11 — End: 2020-08-11
  Filled 2020-08-11: qty 50

## 2020-08-11 NOTE — Progress Notes (Signed)
Pharmacy Antibiotic Note  Kelly [name] is a 35 y.o. female with MRSA bacteremia  And TV endocarditis. 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  Pharmacy has been consulted for vancomycin. Patient is pregnant with EGA of [redacted] weeks.  Scr remains stable at 0.57 (CrCl >100 mL/min). Remains afebrile. Planning 6 weeks of inpatient IV antibiotics. Patient clinically improving. Most recent vancomycin levels on 3/24 appropriate- level yesterday drawn while dose infusing, vancomycin trough today came back therapeutic at 16.   Plan: Continue vancomycin to 750 mg IV q 8 hrs Monitor C&S, clinical status, renal function, VT as needed Plan for levels as needed, most likely early next week    Temp (24hrs), Avg:97.7 F (36.5 C), Min:97.4 F (36.3 C), Max:97.9 F (36.6 C)  Recent Labs  Lab 08/05/20 0328 08/07/20 0329 08/09/20 0205 08/10/20 0258 08/10/20 1236 08/11/20 1144  CREATININE 0.47 0.56 0.49 0.57  --   --   VANCOTROUGH  --   --   --   --  39* 16    Estimated Creatinine Clearance: 110.5 mL/min (by C-G formula based on SCr of 0.57 mg/dL).    Allergies  Allergen Reactions  . Hydrocodone Itching  . Morphine And Related Nausea And Vomiting    Antimicrobials this admission: Azith 3/2 x1 CTX 3/2 x1 Vancomycin 3/2> Cefepime 3/2> 3/3  Cultures: 3/2 urine - ng 3/2 Bld - GPC - MRSA 3/2 MRSA PCR pos 3/4 BCx x 2 > Neg  Thank you for allowing pharmacy to be a part of this patient's care.  Antonietta Jewel, PharmD, [place] Clinical Pharmacist  Phone: [phone] 08/11/2020 1:26 PM  Please check AMION for all [place] phone numbers After 10:00 PM, call [place] [phone]

## 2020-08-11 NOTE — Progress Notes (Signed)
NST performed.  FHR tracing is reassuring for gestation. Pt has no OB complaints at this time.  Dr Nelda Marseille shown fhr tracing and is satisfied.

## 2020-08-11 NOTE — Progress Notes (Addendum)
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 -Current active IV heroin user, uses about 3 g daily and IV fentanyl, presented to the ED with 3 to 4 weeks history of bilateral lower extremity edema associated with worsening dyspnea on exertion for 4 days, intermittent fever for several weeks and pain with deep inspiration across her right lateral chest and flank. -In the ED she was noted to have fluid overload, blood cultures were positive for MRSA and she was a started on IV vancomycin. -2D echo without obvious vegetation, TEE was deferred and plan is to treat empirically for endocarditis (6 weeks of vancomycin) -Patient was also found to be [redacted] weeks Pregnant.Currently 30+1 weeks pregnancy.She will remain inpatient to complete 6 weeks of IV antibiotics. Last Day of IV Abx 4/14. PICC line be removed.  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)   Tricuspid regurgitation   Supervision of other high risk pregnancies, third trimester   Preexisting hypertension complicating pregnancy, antepartum   Dental caries   Endocarditis   History of VBAC   Lumbar back pain   Unwanted fertility   Acute on chronic congestive heart failure (HCC)   Cellulitis of  right lower extremity   Septic embolism (HCC)   Syphilis   [redacted] weeks gestation of pregnancy   Abdominal pain   Drug use affecting pregnancy in second trimester   Methadone maintenance treatment affecting pregnancy ([place])   Maternal endocarditis complicating pregnancy in third trimester   Pain, dental   30+1d weeks pregnancy : -Management per primary service, OB -She will be discharged once she completes the IV antibiotics that will be on 4/14. -Had detailed conversation with OB/GYN today. -Patient will be discharged to Room at the [facility]. (Shelter for pregnant patients)  MRSA bacteremia with presumed endocarditis : -Per ID recommendations, continue IV vancomycin until 4/14. -2D echo without obvious vegetations, TEE deferred as empirically treating this as endocarditis. -Repeat blood cultures so far negative. -On IV vancomycin x 6 weeks, remains afebrile. -She will remain in hospital to complete 6 weeks of IV antibiotics.  Acute on chronic right-sided heart failure with severe TR, severe pulmonary hypertension -2D echo showed decreased RV systolic function, elevated PA pressure 92, severe TR - Repeat Echo 3/23: LVEF 50-55%, Mobile echodensity likely representing vegetation measuring 1cm x  0.6 cm seen on anterior leaflet of tricuspid valve. -She is being followed by cardiology [facility] team, transitioned to Lasix oral on 3/12. -continue Lasix 40 mg PO daily.  Anemia of chronic disease : - Baseline hemoglobin 8.5-9.5 - B12 445, folate 7.4, iron 32, ferritin 111 - Continue prenatal vitamins, H&H stable, 8.3  History of IV heroin and fentanyl abuse, chronic pain syndrome -Patient now started on methadone 100 mg daily on 3/15, She has previously tolerated 100 mg of methadone daily -Dilaudid decreased to 0.5- 1  mg every 4 hours as needed for severe/breakthrough pain -HCV antibody+, hep A, hep B negative -Currently no complaints, feels back to her baseline, denies nausea, vomiting. -She  reports pain is not controlled, She was on 120 mg of methadone on last admission. -Discussed with OB/GYN, methadone dose increased to 110 milligrams daily 3/30.  Pneumonia, possibly septic pulmonary infarcts; -Continue IV vancomycin.  Syphilis -Positive RPR with rash per ID, could be secondary syphilis. -Penicillin 2.4MU IM given  Hyponatremia : Resolved. Benny Lennert >366>440  Back pain - Unable to tolerate MRI, was discontinued, currently on IV antibiotics. - MRI [place] spine ordered, will give low dose ativan before MRI - MRI L spine completed : No evidence of discitis/osteomyelitis.   Jaw pain, tooth pain, dental caries -Placed on Augmentin, multiple caries, outpatient follow-up with [facility]    DVT prophylaxis: Lovenox Code Status: Full code. Family Communication: No family at bed side. Disposition Plan:  Status is: Inpatient  Remains inpatient appropriate because:Inpatient level of care appropriate due to severity of illness   Dispo: The patient is from: Home              Anticipated d/c is to: Home ( Room at [facility])              Patient currently is not medically stable to d/c.   Difficult to place patient No   She will remain in the hospital to complete IV antibiotics for 6 weeks. 08/25/20  Consultants:   Cardiology, infectious diseases, cardiothoracic surgery  Procedures:   Antimicrobials:  Anti-infectives (From admission, onward)   Start     Dose/Rate Route Frequency Ordered Stop   07/22/20 1045  amoxicillin-clavulanate (AUGMENTIN) 875-125 MG per tablet 1 tablet        1 tablet Oral Every 12 hours 07/22/20 0952 07/31/20 2023   07/18/20 0830  vancomycin (VANCOCIN) IVPB 750 mg/150 ml premix        750 mg 150 mL/hr over 60 Minutes Intravenous Every 8 hours 07/18/20 0750     07/16/20 0400  Vancomycin (VANCOCIN) 1,250 mg in sodium chloride 0.9 % 250 mL IVPB  Status:  Discontinued        1,250 mg 166.7 mL/hr over 90 Minutes Intravenous  Every 8 hours 07/15/20 1944 07/18/20 0749   07/15/20 1715  penicillin g benzathine (BICILLIN LA) 1200000 UNIT/2ML injection 2.4 Million Units        2.4 Million Units Intramuscular  Once 07/15/20 1621 07/15/20 1747   07/14/20 0300  vancomycin (VANCOCIN) IVPB 1000 mg/200 mL premix        1,000 mg 200 mL/hr over 60 Minutes Intravenous Every 8 hours 07/13/20 1806 07/15/20 2059   07/13/20 1730  Vancomycin (VANCOCIN) 1,500 mg in sodium chloride 0.9 % 500 mL IVPB        1,500 mg 250 mL/hr over 120 Minutes Intravenous  Once 07/13/20 1709 07/13/20 2116   07/13/20 1730  ceFEPIme (MAXIPIME) 2 g in sodium chloride 0.9 % 100 mL IVPB  Status:  Discontinued        2 g 200 mL/hr over 30 Minutes Intravenous Every 8 hours 07/13/20 1709 07/14/20 1050   07/13/20 1315  cefTRIAXone (ROCEPHIN) 1 g in sodium chloride 0.9 % 100 mL IVPB        1 g 200 mL/hr over 30 Minutes Intravenous  Once 07/13/20 1306 07/13/20 1538   07/13/20 1315  azithromycin (ZITHROMAX) 500 mg in sodium chloride 0.9 % 250 mL IVPB  500 mg 250 mL/hr over 60 Minutes Intravenous  Once 07/13/20 1306 07/13/20 1644      Subjective: Patient was seen and examined at bedside.  Overnight events noted.   She is lying comfortably in the bed. She reports feeling better. She reports sleep has improved after ambien. Patient denies any shortness of breath and chest pain.  She reports fetal movements.  Objective: Vitals:   08/10/20 1832 08/10/20 2353 08/11/20 0623 08/11/20 1204  BP: 104/69 98/65 98/64  112/77  Pulse: 75 81 79 96  Resp: 16 18 18 18   Temp: 97.8 F (36.6 C) 97.6 F (36.4 C) 97.9 F (36.6 C) (!) 97.4 F (36.3 C)  TempSrc: Oral Oral Oral Oral  SpO2: 96% 97% 97% 98%  Weight:   84.1 kg   Height:       No intake or output data in the 24 hours ending 08/11/20 1228 Filed Weights   08/09/20 0535 08/10/20 0514 08/11/20 0623  Weight: 83.5 kg 83.7 kg 84.1 kg    Examination:  General exam: Appears calm and comfortable, not in any  acute distress Respiratory system: Clear to auscultation. Respiratory effort normal. Cardiovascular system: S1 & S2 heard, RRR. No JVD, murmurs, rubs, gallops or clicks. No pedal edema. Gastrointestinal system: Abdomen is nondistended, soft and nontender. No organomegaly or masses felt.  Normal bowel sounds heard. Central nervous system: Alert and oriented. No focal neurological deficits. Extremities: Symmetric 5 x 5 power. No edema, no cyanosis, no clubbing. Skin: No rashes, lesions or ulcers Psychiatry: Judgement and insight appear normal. Mood & affect appropriate.     Data Reviewed: I have personally reviewed following labs and imaging studies  CBC: Recent Labs  Lab 08/05/20 0328 08/06/20 0327 08/07/20 0329 08/10/20 0258  HGB 8.3* 8.3* 8.2* 8.2*  HCT 26.1* 26.0* 25.5* 22.2*   Basic Metabolic Panel: Recent Labs  Lab 08/05/20 0328 08/07/20 0329 08/09/20 0205 08/10/20 0258  NA 131* 132* 132*  --   K 4.0 3.7 3.8  --   CL 99 100 99  --   CO2 25 24 25   --   GLUCOSE 89 110* 88  --   BUN 13 11 12   --   CREATININE 0.47 0.56 0.49 0.57  CALCIUM 9.0 8.9 9.0  --   MG 1.8  --   --   --   PHOS 4.2  --   --   --    GFR: Estimated Creatinine Clearance: 110.5 mL/min (by C-G formula based on SCr of 0.57 mg/dL). Liver Function Tests: No results for input(s): AST, ALT, ALKPHOS, BILITOT, PROT, ALBUMIN in the last 168 hours. No results for input(s): LIPASE, AMYLASE in the last 168 hours. No results for input(s): AMMONIA in the last 168 hours. Coagulation Profile: No results for input(s): INR, PROTIME in the last 168 hours. Cardiac Enzymes: No results for input(s): CKTOTAL, CKMB, CKMBINDEX, TROPONINI in the last 168 hours. BNP (last 3 results) No results for input(s): PROBNP in the last 8760 hours. HbA1C: No results for input(s): HGBA1C in the last 72 hours. CBG: Recent Labs  Lab 08/04/20 1248 08/04/20 1635 08/04/20 2030 08/05/20 0552 08/05/20 1046  GLUCAP 94 109* 90 95 80    Lipid Profile: No results for input(s): CHOL, HDL, LDLCALC, TRIG, CHOLHDL, LDLDIRECT in the last 72 hours. Thyroid Function Tests: No results for input(s): TSH, T4TOTAL, FREET4, T3FREE, THYROIDAB in the last 72 hours. Anemia Panel: No results for input(s): VITAMINB12, FOLATE, FERRITIN, TIBC, IRON, RETICCTPCT in the last 72 hours. Sepsis  Labs: No results for input(s): PROCALCITON, LATICACIDVEN in the last 168 hours.  No results found for this or any previous visit (from the past 240 hour(s)).   Radiology Studies: No results found.  Scheduled Meds: . sodium chloride   Intravenous Once  . acetaminophen  650 mg Oral Q6H  . Chlorhexidine Gluconate Cloth  6 each Topical Daily  . enoxaparin (LOVENOX) injection  40 mg Subcutaneous Q24H  . feeding supplement  237 mL Oral BID BM  . furosemide  40 mg Oral Daily  . methadone  110 mg Oral Daily  . polyethylene glycol  17 g Oral BID  . prenatal vitamin w/FE, FA  1 tablet Oral Q1200  . senna-docusate  1 tablet Oral BID  . sodium chloride flush  10-40 mL Intracatheter Q12H   Continuous Infusions: . vancomycin 750 mg (08/11/20 0509)     LOS: 29 days    Time spent: 25 mins    Namiko Pritts, MD [facility]   If 7PM-7AM, please contact night-coverage

## 2020-08-11 NOTE — TOC Progression Note (Addendum)
Transition of Care (TOC) - Progression Note  Heart Failure   Patient Details  Name: Kelly [name] MRN: [id number] Date of Birth: 01/16/1986  Transition of Care [facility]) CM/SW [place], [place] Phone Number: 08/11/2020, 2:41 PM  Clinical Narrative:    CSW set up an outpatient methadone appointment for [name] 08/31/20 at 6am with [facility] [phone]). [name] is aware of appointment and that she must bring a valid (unexpired) ID  to her methadone appointment. [name] reported that her ID is expired. CSW and the patient attempted to renew her license online however this was unsuccessful. CSW provided [name] with the support phone number at the [facility] [phone]) to call and ask why she is unable to renew her license online as her license meets the requirements to do so. [name] is aware that she will need valid proof of ID for her methadone appointment.  TOC will continue to follow for d/c needs.      Barriers to Discharge: Continued Medical Work [employer] Resources with medical needs,Unsafe home situation,Financial Resources,Active Substance Use - Placement  Expected Discharge Plan and Services   In-house Referral: Clinical Social Work Discharge Planning Services: CM Consult                                           Social Determinants of Health (SDOH) Interventions Food Insecurity Interventions: Other (Comment) (Pt. reports having food stamps) Financial Strain Interventions: Intervention Not Indicated,Other (Comment) (CSW working on connecting Pt. to resources) Housing Interventions: Other (Comment) (Referral to outpatient residential treatment program for pregnant women) Transportation Interventions: [facility] Alcohol Brief Interventions/Follow-up: Continued Monitoring (Pt. reports that she hasn't had a drink since her pregnancy) Depression Interventions/Treatment : Counseling,Medication (Pt. reports  she has been on medication before and needs to get back on medication and she is open to counseling and meds.)  Readmission Risk Interventions Readmission Risk Prevention Plan 07/30/2018  Transportation Screening Complete  Medication Review Press photographer) Complete  PCP or Specialist appointment within 3-5 days of discharge Complete  HRI or [place] Not Complete  HRI or Home Care Consult Pt Refusal Comments N/A  SW Recovery Care/Counseling Consult Complete  Palliative Care Screening Not Complete  Comments N/A  [place] Not Complete  SNF Comments N/A  Some recent data might be hidden   Hadli [place], MSW, LCSWA [phone] Heart Failure Social Worker

## 2020-08-12 DIAGNOSIS — I5023 Acute on chronic systolic (congestive) heart failure: Secondary | ICD-10-CM | POA: Diagnosis not present

## 2020-08-12 DIAGNOSIS — O10919 Unspecified pre-existing hypertension complicating pregnancy, unspecified trimester: Secondary | ICD-10-CM

## 2020-08-12 DIAGNOSIS — R7881 Bacteremia: Secondary | ICD-10-CM | POA: Diagnosis not present

## 2020-08-12 DIAGNOSIS — J189 Pneumonia, unspecified organism: Secondary | ICD-10-CM | POA: Diagnosis not present

## 2020-08-12 LAB — BASIC METABOLIC PANEL
Anion gap: 6 (ref 5–15)
BUN: 11 mg/dL (ref 6–20)
CO2: 25 mmol/L (ref 22–32)
Calcium: 9 mg/dL (ref 8.9–10.3)
Chloride: 102 mmol/L (ref 98–111)
Creatinine, Ser: 0.52 mg/dL (ref 0.44–1.00)
GFR, Estimated: >60 mL/min (ref >60)
Glucose, Bld: 89 mg/dL (ref 70–99)
Potassium: 3.5 mmol/L (ref 3.5–5.1)
Sodium: 133 mmol/L — ABNORMAL LOW (ref 135–145)

## 2020-08-12 LAB — PHOSPHORUS: Phosphorus: 4.1 mg/dL (ref 2.5–4.6)

## 2020-08-12 LAB — CBC
HCT: 24.9 % — ABNORMAL LOW (ref 36.0–46.0)
Hemoglobin: 8.1 g/dL — ABNORMAL LOW (ref 12.0–15.0)
MCH: 30.3 pg (ref 26.0–34.0)
MCHC: 32.5 g/dL (ref 30.0–36.0)
MCV: 93.3 fL (ref 80.0–100.0)
Platelets: 272 10*3/uL (ref 150–400)
RBC: 2.67 MIL/uL — ABNORMAL LOW (ref 3.87–5.11)
RDW: 16 % — ABNORMAL HIGH (ref 11.5–15.5)
WBC: 6.5 10*3/uL (ref 4.0–10.5)
nRBC: 0 % (ref 0.0–0.2)

## 2020-08-12 LAB — MAGNESIUM: Magnesium: 1.6 mg/dL — ABNORMAL LOW (ref 1.7–2.4)

## 2020-08-12 MED ORDER — POTASSIUM CHLORIDE CRYS ER 20 MEQ PO TBCR
30.0000 meq | EXTENDED_RELEASE_TABLET | Freq: Four times a day (QID) | ORAL | Status: AC
  Administered 2020-08-12 (×2): 30 meq via ORAL
  Filled 2020-08-12 (×2): qty 1

## 2020-08-12 MED ORDER — PROMETHAZINE HCL 25 MG PO TABS
25.0000 mg | ORAL_TABLET | Freq: Four times a day (QID) | ORAL | Status: DC | PRN
  Administered 2020-08-12 – 2020-08-25 (×24): 25 mg via ORAL
  Filled 2020-08-12 (×26): qty 1

## 2020-08-12 MED ORDER — SODIUM CHLORIDE 0.9 % IV SOLN
25.0000 mg | Freq: Four times a day (QID) | INTRAVENOUS | Status: DC | PRN
  Administered 2020-08-12: 25 mg via INTRAVENOUS
  Filled 2020-08-12 (×2): qty 1

## 2020-08-12 MED ORDER — MAGNESIUM SULFATE 4 GM/100ML IV SOLN
4.0000 g | Freq: Once | INTRAVENOUS | Status: AC
  Administered 2020-08-12: 4 g via INTRAVENOUS
  Filled 2020-08-12: qty 100

## 2020-08-12 MED ORDER — METHADONE HCL 10 MG PO TABS
120.0000 mg | ORAL_TABLET | Freq: Every day | ORAL | Status: DC
  Administered 2020-08-13 – 2020-08-25 (×13): 120 mg via ORAL
  Filled 2020-08-12 (×13): qty 12

## 2020-08-12 MED ORDER — SERTRALINE HCL 50 MG PO TABS
50.0000 mg | ORAL_TABLET | Freq: Every day | ORAL | Status: DC
  Administered 2020-08-12 – 2020-08-25 (×14): 50 mg via ORAL
  Filled 2020-08-12 (×14): qty 1

## 2020-08-12 NOTE — Progress Notes (Signed)
FACULTY PRACTICE ANTEPARTUM(COMPREHENSIVE) NOTE  Kelly [name] is a 35 y.o. [phone] at [redacted]w[redacted]d who is admitted for MRSA bacteremia, endocarditis with severe tricuspid regurgitation, septic emboli with h/o discitis/epidural abscess, chronic HTN and acute on chronic right sided heart failure in the setting of IV heroin and fentanyl abuse   Fetal presentation is transverse. Length of Stay:  30  Days  Date of admission:07/13/2020  Subjective: Pt notes that she is having vomiting for the past several days- usually happens 1-2x in the am.  Notes lower abdominal pain like a cramp as though she has to have a BM.  She will go to the bathroom and have projectile vomiting.  She will then feel better.  She has had a similar experience for the past few days.  She is able to tolerate other meals.  Denies nausea or vomiting currently.  Denies pelvic or abdominal pain.  She also notes considerable anxiety.  States she used to be on several medications including wellbutrin, zoloft, xanax and some others.  She knows that some of her anxiety is from being bored in the hospital, but she feels like her anxiety is high and stressing her out.  She wishes to restart on a medication if possible. Patient reports the fetal movement as active. Patient reports uterine contraction  activity as none. Patient reports  vaginal bleeding as none. Patient describes fluid per vagina as None.  Vitals:  Blood pressure 106/78, pulse 91, temperature 97.8 F (36.6 C), temperature source Oral, resp. rate 18, height 5' 7.5" (1.715 m), weight 84.7 kg, SpO2 98 %, not currently breastfeeding. Vitals:   08/11/20 1714 08/12/20 0133 08/12/20 0557 08/12/20 1156  BP: 120/79 90/62 95/61  106/78  Pulse: 90 77 74 91  Resp: 18 18 18 18   Temp: 97.8 F (36.6 C) 97.6 F (36.4 C) 97.7 F (36.5 C) 97.8 F (36.6 C)  TempSrc: Oral Oral Oral Oral  SpO2: 99% 97% 97% 98%  Weight:   84.7 kg   Height:       Physical Examination:  General appearance  - alert, well appearing, and in no distress Mental status - anxious Chest - CTAB Heart - normal rate and regular rhythm Abdomen - gravid, soft and non-tender, no reproducible pain, no rebound, no guarding Extremities - no pedal edema noted    Fetal Monitoring:  Baseline: 125 bpm, Variability: moderate, Accelerations: +accels, and Decelerations: no decels     reactive  Labs:  Results for orders placed or performed during the hospital encounter of 07/13/20 (from the past 24 hour(s))  CBC   Collection Time: 08/12/20  3:36 AM  Result Value Ref Range   WBC 6.5 4.0 - 10.5 K/uL   RBC 2.67 (L) 3.87 - 5.11 MIL/uL   Hemoglobin 8.1 (L) 12.0 - 15.0 g/dL   HCT 24.9 (L) 36.0 - 46.0 %   MCV 93.3 80.0 - 100.0 fL   MCH 30.3 26.0 - 34.0 pg   MCHC 32.5 30.0 - 36.0 g/dL   RDW 16.0 (H) 11.5 - 15.5 %   Platelets 272 150 - 400 K/uL   nRBC 0.0 0.0 - 0.2 %  Magnesium   Collection Time: 08/12/20  3:36 AM  Result Value Ref Range   Magnesium 1.6 (L) 1.7 - 2.4 mg/dL  Phosphorus   Collection Time: 08/12/20  3:36 AM  Result Value Ref Range   Phosphorus 4.1 2.5 - 4.6 mg/dL  Basic metabolic panel   Collection Time: 08/12/20  3:36 AM  Result Value Ref  Range   Sodium 133 (L) 135 - 145 mmol/L   Potassium 3.5 3.5 - 5.1 mmol/L   Chloride 102 98 - 111 mmol/L   CO2 25 22 - 32 mmol/L   Glucose, Bld 89 70 - 99 mg/dL   BUN 11 6 - 20 mg/dL   Creatinine, Ser 0.52 0.44 - 1.00 mg/dL   Calcium 9.0 8.9 - 10.3 mg/dL   GFR, Estimated >60 >60 mL/min   Anion gap 6 5 - 15    Imaging Studies:    MR LUMBAR SPINE WO CONTRAST  Result Date: 08/08/2020 CLINICAL DATA:  Low back pain, infection suspected. EXAM: MRI LUMBAR SPINE WITHOUT CONTRAST TECHNIQUE: Multiplanar, multisequence MR imaging of the lumbar spine was performed. No intravenous contrast was administered. COMPARISON:  None. FINDINGS: Segmentation:  Standard. Alignment:  Levoconvex scoliosis. Vertebrae: Postsurgical change from spinal fixation extending from the  visualized lower thoracic spine to the L3 level. There is prominent susceptibility artifact from surgical hardware degrading the images at this levels. No signal changes within the intervertebral discs or marrow edema identified to suggest discitis/osteomyelitis. No acute fracture or aggressive bone lesion identified. Conus medullaris and cauda equina: Conus medullaris is obscured by susceptibility artifact. Paraspinal and other soft tissues: Intrauterine pregnancy is incidentally seen. Disc levels: T12-L1 through L3-4: No significant disc herniation, spinal canal or neural foraminal stenosis. L4-5: Postsurgical changes from prior posterior decompression. Loss of disc height, disc bulge and moderate facet degenerative changes, left greater than right without significant spinal canal or neural foraminal stenosis. L5-S1: Postsurgical changes from prior posterior decompression. Loss of disc height, disc bulge and moderate facet degenerative changes, left greater than right, resulting in mild-to-moderate left neural foraminal narrowing which may impinge on the exiting left L5 nerve root. No spinal canal stenosis. IMPRESSION: 1. Postsurgical changes from prior posterior decompression at L4-5 and L5-S1. 2. Mild to moderate left neural foraminal narrowing at L5-S1 which may impinge on the exiting left L5 nerve root. 3. No significant spinal canal stenosis at any level. 4. No evidence of discitis/osteomyelitis. Electronically Signed   By: Pedro Earls M.D.   On: 08/08/2020 16:22   [place] MFM FETAL BPP WO NON STRESS  Result Date: 08/09/2020 ----------------------------------------------------------------------  OBSTETRICS REPORT                       (Signed Final 08/09/2020 12:35 pm) ---------------------------------------------------------------------- Patient Info  ID #:       [id number]                          D.O.B.:  [birth year] (34 yrs)  Name:       Kelly [name]              Visit Date: 08/09/2020  09:09 am ---------------------------------------------------------------------- Performed By  Attending:        Tama High MD        Ref. Address:     [place]  Performed By:     Valda Favia          [facility]                    [place]  Referred By:      Sloan Leiter                    MD ---------------------------------------------------------------------- Orders  #  Description                           Code        Ordered By  1  [place] MFM FETAL BPP WO NON               76819.01    [place]  2  [place] MFM OB FOLLOW UP                   34193.79    [place] ----------------------------------------------------------------------  #  Order #                     Accession #                Episode #  1  [id number]                   [id number]                 [id number]  2  [id number]                   [id number]                 [id number] ---------------------------------------------------------------------- Indications  Hypertension - Chronic/Pre-existing            O10.019  [redacted] weeks gestation of pregnancy                Z3A.86  Heart disease in mother affecting pregnancy    O99.413  in third trimester  Drug use complicating pregnancy, third         O65.323  trimester  Advanced maternal age multigravida 70+,        O23.523  third trimester  Previous cesarean delivery, antepartum x1      O34.219  Heart disease in mother affecting pregnancy    O99.413  in third trimester ---------------------------------------------------------------------- Fetal Evaluation  Num Of Fetuses:         1  Fetal Heart Rate(bpm):  137  Cardiac Activity:       Observed  Presentation:           Transverse, head to maternal left  Placenta:               Anterior  P. Cord Insertion:      Visualized  Amniotic Fluid  AFI FV:      Within normal limits  AFI Sum(cm)     %Tile       Largest  Pocket(cm)  9.2             7           3.3  RUQ(cm)       RLQ(cm)       LUQ(cm)        LLQ(cm)  1.2  3.3           2.2            2.5 ---------------------------------------------------------------------- Biophysical Evaluation  Amniotic F.V:   Pocket => 2 cm             F. Tone:        Observed  F. Movement:    Observed                   Score:          6/8  F. Breathing:   Not Observed ---------------------------------------------------------------------- Biometry  BPD:      74.1  mm     G. Age:  29w 5d         29  %    CI:        73.23   %    70 - 86                                                          FL/HC:      20.3   %    19.2 - 21.4  HC:      275.2  mm     G. Age:  30w 1d         18  %    HC/AC:      0.99        0.99 - 1.21  AC:      278.2  mm     G. Age:  31w 6d         91  %    FL/BPD:     75.6   %    71 - 87  FL:         56  mm     G. Age:  29w 3d         21  %    FL/AC:      20.1   %    20 - 24  CER:      33.8  mm     G. Age:  28w 5d          8  %  Est. FW:    1637  gm    3 lb 10 oz      65  % ---------------------------------------------------------------------- OB History  Gravidity:    7         Term:   3        Prem:   0        SAB:   3  TOP:          0       Ectopic:  0        Living: 3 ---------------------------------------------------------------------- Gestational Age  U/S Today:     30w 2d                                        EDD:   10/16/20  Best:          30w 0d     Det. By:  U/S  (07/13/20)          EDD:   10/18/20 ---------------------------------------------------------------------- Anatomy  Cranium:  Appears normal         LVOT:                   Not well visualized  Cavum:                 Previously seen        Aortic Arch:            Not well visualized  Ventricles:            Appears normal         Ductal Arch:            Appears normal  Choroid Plexus:        Previously seen        Diaphragm:              Previously seen  Cerebellum:            Appears  normal         Stomach:                Appears normal, left                                                                        sided  Posterior Fossa:       Appears normal         Abdomen:                Appears normal  Nuchal Fold:           Not applicable (>60    Abdominal Wall:         Not well visualized                         wks GA)  Face:                  Not well visualized    Cord Vessels:           Appears normal (3                                                                        vessel cord)  Lips:                  Appears normal         Kidneys:                Appear normal  Palate:                Not well visualized    Bladder:                Appears normal  Thoracic:              Appears normal         Spine:  Not well visualized  Heart:                 Appears normal         Upper Extremities:      Previously seen                         (4CH, axis, and                         situs)  RVOT:                  Appears normal         Lower Extremities:      Previously seen  Other:  Fetus appears to be a female. Technically difficult due to maternal          habitus and fetal position. ---------------------------------------------------------------------- Cervix Uterus Adnexa  Cervix  Not visualized (advanced GA >24wks)  Uterus  No abnormality visualized.  Right Ovary  No adnexal mass visualized.  Left Ovary  No adnexal mass visualized.  Cul De Sac  No free fluid seen.  Adnexa  No abnormality visualized. ---------------------------------------------------------------------- Impression  Fetal growth is appropriate for gestational age .Amniotic fluid  is normal and good fetal activity is seen .Antenatal testing is  reassuring. BPP 8/8. ---------------------------------------------------------------------- Recommendations  -Continue weekly BPP till delivery. ----------------------------------------------------------------------                  Tama High, MD Electronically Signed  Final Report   08/09/2020 12:35 pm ----------------------------------------------------------------------  [place] MFM OB FOLLOW UP  Result Date: 08/09/2020 ----------------------------------------------------------------------  OBSTETRICS REPORT                       (Signed Final 08/09/2020 12:35 pm) ---------------------------------------------------------------------- Patient Info  ID #:       [id number]                          D.O.B.:  July 11, 1985 (34 yrs)  Name:       Kelly [name]              Visit Date: 08/09/2020 09:09 am ---------------------------------------------------------------------- Performed By  Attending:        Tama High MD        Ref. Address:     [place]  Performed By:     Valda Favia          [facility]                    [place]  Referred By:      Sloan Leiter                    MD ---------------------------------------------------------------------- Orders  #  Description                           Code        Ordered By  1  [place] MFM FETAL BPP WO NON               [id number]    [place]  2  [place] MFM OB FOLLOW UP                   [id number]    [place] ----------------------------------------------------------------------  #  Order #                     Accession #                Episode #  1  [id number]                   [id number]                 [id number]  2  [id number]                   [id number]                 [id number] ---------------------------------------------------------------------- Indications  Hypertension - Chronic/Pre-existing            O10.019  [redacted] weeks gestation of pregnancy                Z3A.74  Heart disease in mother affecting pregnancy    O99.413  in third trimester  Drug use complicating pregnancy, third         O29.323  trimester  Advanced maternal age multigravida 5+,        O46.523  third trimester   Previous cesarean delivery, antepartum x1      O34.219  Heart disease in mother affecting pregnancy    O99.413  in third trimester ---------------------------------------------------------------------- Fetal Evaluation  Num Of Fetuses:         1  Fetal Heart Rate(bpm):  137  Cardiac Activity:       Observed  Presentation:           Transverse, head to maternal left  Placenta:               Anterior  P. Cord Insertion:      Visualized  Amniotic Fluid  AFI FV:      Within normal limits  AFI Sum(cm)     %Tile       Largest Pocket(cm)  9.2             7           3.3  RUQ(cm)       RLQ(cm)       LUQ(cm)        LLQ(cm)  1.2           3.3           2.2            2.5 ---------------------------------------------------------------------- Biophysical Evaluation  Amniotic F.V:   Pocket => 2 cm             F. Tone:        Observed  F. Movement:    Observed                   Score:  6/8  F. Breathing:   Not Observed ---------------------------------------------------------------------- Biometry  BPD:      74.1  mm     G. Age:  29w 5d         29  %    CI:        73.23   %    70 - 86                                                          FL/HC:      20.3   %    19.2 - 21.4  HC:      275.2  mm     G. Age:  30w 1d         18  %    HC/AC:      0.99        0.99 - 1.21  AC:      278.2  mm     G. Age:  31w 6d         91  %    FL/BPD:     75.6   %    71 - 87  FL:         56  mm     G. Age:  29w 3d         21  %    FL/AC:      20.1   %    20 - 24  CER:      33.8  mm     G. Age:  28w 5d          8  %  Est. FW:    1637  gm    3 lb 10 oz      65  % ---------------------------------------------------------------------- OB History  Gravidity:    7         Term:   3        Prem:   0        SAB:   3  TOP:          0       Ectopic:  0        Living: 3 ---------------------------------------------------------------------- Gestational Age  U/S Today:     30w 2d                                        EDD:   10/16/20  Best:          30w  0d     Det. By:  U/S  (07/13/20)          EDD:   10/18/20 ---------------------------------------------------------------------- Anatomy  Cranium:               Appears normal         LVOT:                   Not well visualized  Cavum:                 Previously seen        Aortic Arch:            Not well visualized  Ventricles:  Appears normal         Ductal Arch:            Appears normal  Choroid Plexus:        Previously seen        Diaphragm:              Previously seen  Cerebellum:            Appears normal         Stomach:                Appears normal, left                                                                        sided  Posterior Fossa:       Appears normal         Abdomen:                Appears normal  Nuchal Fold:           Not applicable (>85    Abdominal Wall:         Not well visualized                         wks GA)  Face:                  Not well visualized    Cord Vessels:           Appears normal (3                                                                        vessel cord)  Lips:                  Appears normal         Kidneys:                Appear normal  Palate:                Not well visualized    Bladder:                Appears normal  Thoracic:              Appears normal         Spine:                  Not well visualized  Heart:                 Appears normal         Upper Extremities:      Previously seen                         (4CH, axis, and  situs)  RVOT:                  Appears normal         Lower Extremities:      Previously seen  Other:  Fetus appears to be a female. Technically difficult due to maternal          habitus and fetal position. ---------------------------------------------------------------------- Cervix Uterus Adnexa  Cervix  Not visualized (advanced GA >24wks)  Uterus  No abnormality visualized.  Right Ovary  No adnexal mass visualized.  Left Ovary  No adnexal mass visualized.  Cul De Sac  No free fluid  seen.  Adnexa  No abnormality visualized. ---------------------------------------------------------------------- Impression  Fetal growth is appropriate for gestational age .Amniotic fluid  is normal and good fetal activity is seen .Antenatal testing is  reassuring. BPP 8/8. ---------------------------------------------------------------------- Recommendations  -Continue weekly BPP till delivery. ----------------------------------------------------------------------                  Tama High, MD Electronically Signed Final Report   08/09/2020 12:35 pm ----------------------------------------------------------------------    Medications:  Scheduled . sodium chloride   Intravenous Once  . acetaminophen  650 mg Oral Q6H  . Chlorhexidine Gluconate Cloth  6 each Topical Daily  . enoxaparin (LOVENOX) injection  40 mg Subcutaneous Q24H  . feeding supplement  237 mL Oral BID BM  . furosemide  40 mg Oral Daily  . [START ON 08/13/2020] methadone  120 mg Oral Daily  . polyethylene glycol  17 g Oral BID  . potassium chloride  30 mEq Oral Q6H  . prenatal vitamin w/FE, FA  1 tablet Oral Q1200  . senna-docusate  1 tablet Oral BID  . sodium chloride flush  10-40 mL Intracatheter Q12H   I have reviewed the patient's current medications.  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   Changed from IV to po phenergan as she is overall tolerating po without difficulty -From OB standpoint- fetal well being reassuring, continue daily NST and weekly [facility] [place] [facility] [place] -[facility]  Please call [phone] Attending Monday-Friday 8am - 5pm) or [phone] Attending On Call all day, every day) for any obstetric concerns at any time.   JenJanyth PupaO Attending ObsOliver SpringsacTower [facility] WomDean Foods CompanyonPollock

## 2020-08-12 NOTE — Progress Notes (Addendum)
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 Patient is an active IV heroin user. Echocardiogram showed no obvious vegetation and TEE was deferred. Plan is treat empirically for endocarditis (6weeks of vancomycin). Patient has a PICC line in place which will need to be removed prior to discharge.  Assessment & Plan   MRSA bacteremia with presumed endocarditis -Blood cultures from 07/13/2020 MRSA -Echocardiogram EF 55%, no obvious vegetations -Infectious disease consulted and appreciated, recommended IV vancomycin through 08/25/20- treat empirically  -repeat blood cultures showed no growth -Patient remain in the hospital until completion of 6 weeks of IV antibiotics through 08/26/2018 -Currently has PICC line in place which will need to be removed prior to discharge  Acute on chronic right-sided heart failure with severe TR, severe pulmonary hypertension -Echocardiogram showed a decreased RV systolic function with a PA pressure of 92, severe TR -Repeat echocardiogram on 08/03/2020 showed an EF of 50 to 55%, mobile echodensity likely representing vegetation measuring 1 cm x 0.6 cm on the anterior leaflet of the tricuspid valve -Cardiology and heart failure team consulted and appreciated, patient was transitioned to oral Lasix on 07/23/2020 -Continue Lasix 40 mg daily  -monitor intake and output, daily weights  Anemia of chronic disease -Baseline hemoglobin 8.5-9.5. Hemoglobin currently 8.2 -vitamin B12 445, folate 7.4, iron 32, ferritin 111 -Continue prenatal vitamins -Monitor H&H  History of IV  heroin and fentanyl abuse/chronic pain syndrome -Patient has now been started on methadone, dose was increased to 110 mg daily after discussion with OB/GYN on 08/10/2020 -Continue Dilaudid as needed for severe pain and breakthrough pain -Of note, patient was on 120 mg of methadone on her last admission -Patient does complain of nausea and vomiting  Pregnancy -Currently [redacted] weeks pregnant -Management per OB/GYN service -Discussed with Dr. Nelda Marseille this morning, patient is having nausea and vomiting.  She was on Compazine which has been discontinued.  Have placed patient on Phenergan.  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 -Patient received penicillin 2.4 MU IM  Hyponatremia -Sodium 133, continue to monitor BMP  Back pain -MRI of the lumbar spine showed no evidence of discitis or osteomyelitis -Continue pain control as above  Jaw pain/tooth pain dental caries -Patient was on Augmentin, has multiple dental caries and will follow up with [facility] as an outpatient   DVT Prophylaxis Lovenox  Code Status: Full  Family Communication: None at bedside  Disposition Plan:  Status is: Inpatient  Remains inpatient appropriate because:IV treatments appropriate due to intensity of illness or inability to take PO   Dispo: The patient is from: Home              Anticipated d/c is to: Home              Patient currently is not medically stable to d/c.   Difficult to place patient No  Consultants Cardiology Infectious disease Cardiothoracic surgery OB/GYN  Procedures  Echocardiogram PICC line placement  Antibiotics   Anti-infectives (  From admission, onward)   Start     Dose/Rate Route Frequency Ordered Stop   08/11/20 1600  ceFAZolin (ANCEF) IVPB 1  g/50 mL premix  Status:  Discontinued        1 g 100 mL/hr over 30 Minutes Intravenous Every 24 hours 08/11/20 1315 08/11/20 1316   07/22/20 1045  amoxicillin-clavulanate (AUGMENTIN) 875-125 MG per tablet 1 tablet        1 tablet Oral Every 12 hours 07/22/20 0952 07/31/20 2023   07/18/20 0830  vancomycin (VANCOCIN) IVPB 750 mg/150 ml premix        750 mg 150 mL/hr over 60 Minutes Intravenous Every 8 hours 07/18/20 0750     07/16/20 0400  Vancomycin (VANCOCIN) 1,250 mg in sodium chloride 0.9 % 250 mL IVPB  Status:  Discontinued        1,250 mg 166.7 mL/hr over 90 Minutes Intravenous Every 8 hours 07/15/20 1944 07/18/20 0749   07/15/20 1715  penicillin g benzathine (BICILLIN LA) 1200000 UNIT/2ML injection 2.4 Million Units        2.4 Million Units Intramuscular  Once 07/15/20 1621 07/15/20 1747   07/14/20 0300  vancomycin (VANCOCIN) IVPB 1000 mg/200 mL premix        1,000 mg 200 mL/hr over 60 Minutes Intravenous Every 8 hours 07/13/20 1806 07/15/20 2059   07/13/20 1730  Vancomycin (VANCOCIN) 1,500 mg in sodium chloride 0.9 % 500 mL IVPB        1,500 mg 250 mL/hr over 120 Minutes Intravenous  Once 07/13/20 1709 07/13/20 2116   07/13/20 1730  ceFEPIme (MAXIPIME) 2 g in sodium chloride 0.9 % 100 mL IVPB  Status:  Discontinued        2 g 200 mL/hr over 30 Minutes Intravenous Every 8 hours 07/13/20 1709 07/14/20 1050   07/13/20 1315  cefTRIAXone (ROCEPHIN) 1 g in sodium chloride 0.9 % 100 mL IVPB        1 g 200 mL/hr over 30 Minutes Intravenous  Once 07/13/20 1306 07/13/20 1538   07/13/20 1315  azithromycin (ZITHROMAX) 500 mg in sodium chloride 0.9 % 250 mL IVPB        500 mg 250 mL/hr over 60 Minutes Intravenous  Once 07/13/20 1306 07/13/20 1644      Subjective:   Kelly [name] seen and examined today.  Patient denies any abdominal pain this morning.  Does complain of nausea and vomiting states this has been happening early morning.  Denies current chest pain or shortness of breath,  dizziness or headache.  Wishes to have telemetry monitor removed.    Objective:   Vitals:   08/11/20 1204 08/11/20 1714 08/12/20 0133 08/12/20 0557  BP: 112/77 120/79 90/62 95/61   Pulse: 96 90 77 74  Resp: 18 18 18 18   Temp: (!) 97.4 F (36.3 C) 97.8 F (36.6 C) 97.6 F (36.4 C) 97.7 F (36.5 C)  TempSrc: Oral Oral Oral Oral  SpO2: 98% 99% 97% 97%  Weight:    84.7 kg  Height:        Intake/Output Summary (Last 24 hours) at 08/12/2020 1039 Last data filed at 08/11/2020 2300 Gross per 24 hour  Intake 240 ml  Output --  Net 240 ml   Filed Weights   08/10/20 0514 08/11/20 0623 08/12/20 0557  Weight: 83.7 kg 84.1 kg 84.7 kg    Exam  General: Well developed, well nourished, chronically ill-appearing, mild distress  HEENT: NCAT, mucous membranes moist.   Cardiovascular: S1 S2 auscultated, RRR, 2/6  SEM  Respiratory: Diminished however clear, no wheezing  Abdomen: Soft, nontender, nondistended, + bowel sounds, + pregnancy  Extremities: warm dry without cyanosis clubbing or edema  Neuro: AAOx3, cranial nerves grossly intact. Strength 5/5 in patient's upper and lower extremities bilaterally  Psych: appropriate mood and affect   Data Reviewed: I have personally reviewed following labs and imaging studies  CBC: Recent Labs  Lab 08/06/20 0327 08/07/20 0329 08/10/20 0258 08/12/20 0336  WBC  --   --   --  6.5  HGB 8.3* 8.2* 8.2* 8.1*  HCT 26.0* 25.5* 24.7* 24.9*  MCV  --   --   --  93.3  PLT  --   --   --  621   Basic Metabolic Panel: Recent Labs  Lab 08/07/20 0329 08/09/20 0205 08/10/20 0258 08/12/20 0336  NA 132* 132*  --  133*  K 3.7 3.8  --  3.5  CL 100 99  --  102  CO2 24 25  --  25  GLUCOSE 110* 88  --  89  BUN 11 12  --  11  CREATININE 0.56 0.49 0.57 0.52  CALCIUM 8.9 9.0  --  9.0  MG  --   --   --  1.6*  PHOS  --   --   --  4.1   GFR: Estimated Creatinine Clearance: 110.9 mL/min (by C-G formula based on SCr of 0.52 mg/dL). Liver Function  Tests: No results for input(s): AST, ALT, ALKPHOS, BILITOT, PROT, ALBUMIN in the last 168 hours. No results for input(s): LIPASE, AMYLASE in the last 168 hours. No results for input(s): AMMONIA in the last 168 hours. Coagulation Profile: No results for input(s): INR, PROTIME in the last 168 hours. Cardiac Enzymes: No results for input(s): CKTOTAL, CKMB, CKMBINDEX, TROPONINI in the last 168 hours. BNP (last 3 results) No results for input(s): PROBNP in the last 8760 hours. HbA1C: No results for input(s): HGBA1C in the last 72 hours. CBG: Recent Labs  Lab 08/05/20 1046  GLUCAP 80   Lipid Profile: No results for input(s): CHOL, HDL, LDLCALC, TRIG, CHOLHDL, LDLDIRECT in the last 72 hours. Thyroid Function Tests: No results for input(s): TSH, T4TOTAL, FREET4, T3FREE, THYROIDAB in the last 72 hours. Anemia Panel: No results for input(s): VITAMINB12, FOLATE, FERRITIN, TIBC, IRON, RETICCTPCT in the last 72 hours. Urine analysis:    Component Value Date/Time   COLORURINE AMBER (A) 07/13/2020 1320   APPEARANCEUR CLOUDY (A) 07/13/2020 1320   APPEARANCEUR Hazy 07/10/2013 1121   LABSPEC 1.028 07/13/2020 1320   LABSPEC 1.020 07/10/2013 1121   PHURINE 5.0 07/13/2020 1320   GLUCOSEU NEGATIVE 07/13/2020 1320   GLUCOSEU Negative 07/10/2013 1121   HGBUR SMALL (A) 07/13/2020 1320   BILIRUBINUR NEGATIVE 07/13/2020 1320   BILIRUBINUR Negative 07/10/2013 1121   KETONESUR 5 (A) 07/13/2020 1320   PROTEINUR 100 (A) 07/13/2020 1320   UROBILINOGEN 1.0 02/15/2015 0530   NITRITE POSITIVE (A) 07/13/2020 1320   LEUKOCYTESUR LARGE (A) 07/13/2020 1320   LEUKOCYTESUR Trace 07/10/2013 1121   Sepsis Labs: @LABRCNTIP (procalcitonin:4,lacticidven:4)  )No results found for this or any previous visit (from the past 240 hour(s)).    Radiology Studies: No results found.   Scheduled Meds: . sodium chloride   Intravenous Once  . acetaminophen  650 mg Oral Q6H  . Chlorhexidine Gluconate Cloth  6 each  Topical Daily  . enoxaparin (LOVENOX) injection  40 mg Subcutaneous Q24H  . feeding supplement  237 mL Oral BID BM  . furosemide  40  mg Oral Daily  . methadone  110 mg Oral Daily  . polyethylene glycol  17 g Oral BID  . prenatal vitamin w/FE, FA  1 tablet Oral Q1200  . senna-docusate  1 tablet Oral BID  . sodium chloride flush  10-40 mL Intracatheter Q12H   Continuous Infusions: . promethazine (PHENERGAN) injection    . vancomycin 750 mg (08/12/20 0801)     LOS: 30 days   Time Spent in minutes   45 minutes  Tiffanye Hartmann D.O. on 08/12/2020 at 10:39 AM  Between 7am to 7pm - Please see pager noted on [email or web address]  After 7pm go to [email or web address]  And look for the night coverage person covering for me after hours  [facility] Office  [phone]

## 2020-08-12 NOTE — Progress Notes (Signed)
NST completed.  Reactive and reassuring at 30 3/7 weeks.  Dr Kathlen Mody updated on status.

## 2020-08-12 NOTE — Progress Notes (Signed)
Daily NST initiated.  Pt in good spirits.  Some nausea.  VSS. No OB complaints at this time.+

## 2020-08-13 DIAGNOSIS — O10919 Unspecified pre-existing hypertension complicating pregnancy, unspecified trimester: Secondary | ICD-10-CM | POA: Diagnosis not present

## 2020-08-13 DIAGNOSIS — J189 Pneumonia, unspecified organism: Secondary | ICD-10-CM | POA: Diagnosis not present

## 2020-08-13 DIAGNOSIS — R7881 Bacteremia: Secondary | ICD-10-CM | POA: Diagnosis not present

## 2020-08-13 DIAGNOSIS — I5023 Acute on chronic systolic (congestive) heart failure: Secondary | ICD-10-CM | POA: Diagnosis not present

## 2020-08-13 LAB — BASIC METABOLIC PANEL
Anion gap: 6 (ref 5–15)
BUN: 15 mg/dL (ref 6–20)
CO2: 24 mmol/L (ref 22–32)
Calcium: 8.7 mg/dL — ABNORMAL LOW (ref 8.9–10.3)
Chloride: 101 mmol/L (ref 98–111)
Creatinine, Ser: 0.56 mg/dL (ref 0.44–1.00)
GFR, Estimated: >60 mL/min (ref >60)
Glucose, Bld: 88 mg/dL (ref 70–99)
Potassium: 3.9 mmol/L (ref 3.5–5.1)
Sodium: 131 mmol/L — ABNORMAL LOW (ref 135–145)

## 2020-08-13 LAB — HEMOGLOBIN AND HEMATOCRIT, BLOOD
HCT: 26.1 % — ABNORMAL LOW (ref 36.0–46.0)
Hemoglobin: 8.3 g/dL — ABNORMAL LOW (ref 12.0–15.0)

## 2020-08-13 MED ORDER — DOXYLAMINE SUCCINATE (SLEEP) 25 MG PO TABS
12.5000 mg | ORAL_TABLET | Freq: Every day | ORAL | Status: DC
  Administered 2020-08-13: 12.5 mg via ORAL
  Filled 2020-08-13: qty 1

## 2020-08-13 MED ORDER — VITAMIN B-6 25 MG PO TABS: 12.5000 mg | ORAL_TABLET | Freq: Two times a day (BID) | ORAL | Status: DC | PRN

## 2020-08-13 MED ORDER — VITAMIN B-6 25 MG PO TABS
12.5000 mg | ORAL_TABLET | Freq: Every day | ORAL | Status: DC
  Administered 2020-08-13: 12.5 mg via ORAL
  Filled 2020-08-13: qty 1

## 2020-08-13 MED ORDER — DOXYLAMINE SUCCINATE (SLEEP) 25 MG PO TABS
12.5000 mg | ORAL_TABLET | Freq: Two times a day (BID) | ORAL | Status: DC | PRN
Start: 2020-08-13 — End: 2020-08-13

## 2020-08-13 NOTE — Progress Notes (Signed)
NST reactive. Pt denies vaginal bleeding or leaking of fluid. No uc's tracing.

## 2020-08-13 NOTE — Progress Notes (Signed)
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 Patient is an active IV heroin user. Echocardiogram showed no obvious vegetation and TEE was deferred. Plan is treat empirically for endocarditis (6weeks of vancomycin). Patient has a PICC line in place which will need to be removed prior to discharge.  Assessment & Plan   MRSA bacteremia with presumed endocarditis -Blood cultures from 07/13/2020 MRSA -Echocardiogram EF 55%, no obvious vegetations -Infectious disease consulted and appreciated, recommended IV vancomycin through 08/25/20- treat empirically  -repeat blood cultures showed no growth -Patient remain in the hospital until completion of 6 weeks of IV antibiotics through 08/26/2018 -Currently has PICC line in place which will need to be removed prior to discharge  Acute on chronic right-sided heart failure with severe TR, severe pulmonary hypertension -Echocardiogram showed a decreased RV systolic function with a PA pressure of 92, severe TR -Repeat echocardiogram on 08/03/2020 showed an EF of 50 to 55%, mobile echodensity likely representing vegetation measuring 1 cm x 0.6 cm on the anterior leaflet of the tricuspid valve -Cardiology and heart failure team consulted and appreciated, patient was transitioned to oral Lasix on 07/23/2020 -Continue Lasix 40 mg daily  -monitor intake and output, daily weights  Anemia of chronic disease -Baseline hemoglobin 8.5-9.5. Hemoglobin currently 8.3 -vitamin B12 445, folate 7.4, iron 32, ferritin 111 -Continue prenatal vitamins -Monitor H&H  History of IV  heroin and fentanyl abuse/chronic pain syndrome -Patient has now been started on methadone -Discussed with ob/gyn, Dr. Nelda Marseille, will increase methadone to 138m daily -Continue Dilaudid as needed for severe pain and breakthrough pain -Feels nausea has improved, no further vomiting this morning -Patient was started on Zoloft 50 mg daily  Pregnancy -Currently [redacted] weeks pregnant -Management per OB/GYN service -Discussed with Dr. ONelda Marseillethis morning, patient is having nausea and vomiting.  She was on Compazine which has been discontinued.  Have placed patient on Phenergan. -As above, no further vomiting today  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 -Patient received penicillin 2.4 MU IM  Hyponatremia -Sodium 131, continue to monitor BMP  Back pain -MRI of the lumbar spine showed no evidence of discitis or osteomyelitis -Continue pain control as above  Jaw pain/tooth pain dental caries -Patient was on Augmentin, has multiple dental caries and [facility]   DVT Prophylaxis Lovenox  Code Status: Full  Family Communication: None at bedside  Disposition Plan:  Status is: Inpatient  Remains inpatient appropriate because:IV treatments appropriate due to intensity of illness or inability to take PO   Dispo: The patient is from: Home              Anticipated d/c is to: Home              Patient currently is not medically stable to d/c.   Difficult to place patient No  Consultants Cardiology Infectious disease Cardiothoracic surgery OB/GYN  Procedures  Echocardiogram PICC line placement  Antibiotics  Anti-infectives (From admission, onward)   Start     Dose/Rate Route Frequency Ordered Stop   08/11/20 1600   ceFAZolin (ANCEF) IVPB 1 g/50 mL premix  Status:  Discontinued        1 g 100 mL/hr over 30 Minutes Intravenous Every 24 hours 08/11/20 1315 08/11/20 1316   07/22/20 1045  amoxicillin-clavulanate (AUGMENTIN) 875-125 MG per tablet 1 tablet        1 tablet Oral Every 12 hours 07/22/20 0952 07/31/20 2023   07/18/20 0830  vancomycin (VANCOCIN) IVPB 750 mg/150 ml premix        750 mg 150 mL/hr over 60 Minutes Intravenous Every 8 hours 07/18/20 0750     07/16/20 0400  Vancomycin (VANCOCIN) 1,250 mg in sodium chloride 0.9 % 250 mL IVPB  Status:  Discontinued        1,250 mg 166.7 mL/hr over 90 Minutes Intravenous Every 8 hours 07/15/20 1944 07/18/20 0749   07/15/20 1715  penicillin g benzathine (BICILLIN LA) 1200000 UNIT/2ML injection 2.4 Million Units        2.4 Million Units Intramuscular  Once 07/15/20 1621 07/15/20 1747   07/14/20 0300  vancomycin (VANCOCIN) IVPB 1000 mg/200 mL premix        1,000 mg 200 mL/hr over 60 Minutes Intravenous Every 8 hours 07/13/20 1806 07/15/20 2059   07/13/20 1730  Vancomycin (VANCOCIN) 1,500 mg in sodium chloride 0.9 % 500 mL IVPB        1,500 mg 250 mL/hr over 120 Minutes Intravenous  Once 07/13/20 1709 07/13/20 2116   07/13/20 1730  ceFEPIme (MAXIPIME) 2 g in sodium chloride 0.9 % 100 mL IVPB  Status:  Discontinued        2 g 200 mL/hr over 30 Minutes Intravenous Every 8 hours 07/13/20 1709 07/14/20 1050   07/13/20 1315  cefTRIAXone (ROCEPHIN) 1 g in sodium chloride 0.9 % 100 mL IVPB        1 g 200 mL/hr over 30 Minutes Intravenous  Once 07/13/20 1306 07/13/20 1538   07/13/20 1315  azithromycin (ZITHROMAX) 500 mg in sodium chloride 0.9 % 250 mL IVPB        500 mg 250 mL/hr over 60 Minutes Intravenous  Once 07/13/20 1306 07/13/20 1644      Subjective:   Kelly [name] seen and examined today.  Denies current abdominal pain.  No further nausea or vomiting this morning.  Able to walk up and down the hallways.  Currently denies chest pain or shortness of  breath, dizziness or headache.   Objective:   Vitals:   08/12/20 1733 08/12/20 2351 08/13/20 0438 08/13/20 1119  BP: 101/67 103/64 97/61 109/72  Pulse: 76 74 69 92  Resp: 18 18 17 17   Temp: (!) 97.5 F (36.4 C) 98.3 F (36.8 C) 97.8 F (36.6 C) 98.2 F (36.8 C)  TempSrc: Oral Oral Oral Oral  SpO2: 97% 99% 100% 97%  Weight:   84.5 kg   Height:        Intake/Output Summary (Last 24 hours) at 08/13/2020 1153 Last data filed at 08/13/2020 0900 Gross per 24 hour  Intake 1729.01 ml  Output --  Net 1729.01 ml   Filed Weights   08/11/20 0623 08/12/20 0557 08/13/20 0438  Weight: 84.1 kg 84.7 kg 84.5 kg    Exam  General: Well developed, well nourished, chronically ill-appearing, mild distress  HEENT: NCAT, mucous membranes moist.   Cardiovascular: S1 S2 auscultated, RRR, 2/6 SEM  Respiratory: Diminished however clear, no  wheezing or rales  Abdomen: Soft, nontender, nondistended, + bowel sounds, + pregnancy  Extremities: warm dry without cyanosis clubbing or edema  Neuro: AAOx3, nonfocal, gait normal  Psych: appropriate mood and affect, pleasant    Data Reviewed: I have personally reviewed following labs and imaging studies  CBC: Recent Labs  Lab 08/07/20 0329 08/10/20 0258 08/12/20 0336 08/13/20 0258  WBC  --   --  6.5  --   HGB 8.2* 8.2* 8.1* 8.3*  HCT 25.5* 24.7* 24.9* 26.1*  MCV  --   --  93.3  --   PLT  --   --  272  --    Basic Metabolic Panel: Recent Labs  Lab 08/07/20 0329 08/09/20 0205 08/10/20 0258 08/12/20 0336 08/13/20 0258  NA 132* 132*  --  133* 131*  K 3.7 3.8  --  3.5 3.9  CL 100 99  --  102 101  CO2 24 25  --  25 24  GLUCOSE 110* 88  --  89 88  BUN 11 12  --  11 15  CREATININE 0.56 0.49 0.57 0.52 0.56  CALCIUM 8.9 9.0  --  9.0 8.7*  MG  --   --   --  1.6*  --   PHOS  --   --   --  4.1  --    GFR: Estimated Creatinine Clearance: 110.8 mL/min (by C-G formula based on SCr of 0.56 mg/dL). Liver Function Tests: No results for  input(s): AST, ALT, ALKPHOS, BILITOT, PROT, ALBUMIN in the last 168 hours. No results for input(s): LIPASE, AMYLASE in the last 168 hours. No results for input(s): AMMONIA in the last 168 hours. Coagulation Profile: No results for input(s): INR, PROTIME in the last 168 hours. Cardiac Enzymes: No results for input(s): CKTOTAL, CKMB, CKMBINDEX, TROPONINI in the last 168 hours. BNP (last 3 results) No results for input(s): PROBNP in the last 8760 hours. HbA1C: No results for input(s): HGBA1C in the last 72 hours. CBG: No results for input(s): GLUCAP in the last 168 hours. Lipid Profile: No results for input(s): CHOL, HDL, LDLCALC, TRIG, CHOLHDL, LDLDIRECT in the last 72 hours. Thyroid Function Tests: No results for input(s): TSH, T4TOTAL, FREET4, T3FREE, THYROIDAB in the last 72 hours. Anemia Panel: No results for input(s): VITAMINB12, FOLATE, FERRITIN, TIBC, IRON, RETICCTPCT in the last 72 hours. Urine analysis:    Component Value Date/Time   COLORURINE AMBER (A) 07/13/2020 1320   APPEARANCEUR CLOUDY (A) 07/13/2020 1320   APPEARANCEUR Hazy 07/10/2013 1121   LABSPEC 1.028 07/13/2020 1320   LABSPEC 1.020 07/10/2013 1121   PHURINE 5.0 07/13/2020 1320   GLUCOSEU NEGATIVE 07/13/2020 1320   GLUCOSEU Negative 07/10/2013 1121   HGBUR SMALL (A) 07/13/2020 1320   BILIRUBINUR NEGATIVE 07/13/2020 1320   BILIRUBINUR Negative 07/10/2013 1121   KETONESUR 5 (A) 07/13/2020 1320   PROTEINUR 100 (A) 07/13/2020 1320   UROBILINOGEN 1.0 02/15/2015 0530   NITRITE POSITIVE (A) 07/13/2020 1320   LEUKOCYTESUR LARGE (A) 07/13/2020 1320   LEUKOCYTESUR Trace 07/10/2013 1121   Sepsis Labs: @LABRCNTIP (procalcitonin:4,lacticidven:4)  )No results found for this or any previous visit (from the past 240 hour(s)).    Radiology Studies: No results found.   Scheduled Meds: . sodium chloride   Intravenous Once  . acetaminophen  650 mg Oral Q6H  . Chlorhexidine Gluconate Cloth  6 each Topical Daily  .  enoxaparin (LOVENOX) injection  40 mg Subcutaneous Q24H  . feeding supplement  237 mL Oral BID BM  . furosemide  40 mg Oral Daily  . methadone  120 mg Oral Daily  . polyethylene glycol  17 g Oral BID  . prenatal vitamin w/FE, FA  1 tablet Oral Q1200  . senna-docusate  1 tablet Oral BID  . sertraline  50 mg Oral Daily  . sodium chloride flush  10-40 mL Intracatheter Q12H   Continuous Infusions: . vancomycin 750 mg (08/13/20 0426)     LOS: 31 days   Time Spent in minutes   45 minutes  Kelly [name] D.O. on 08/13/2020 at 11:53 AM  Between 7am to 7pm - Please see pager noted on [email or web address]  After 7pm go to [email or web address]  And look for the night coverage person covering for me after hours  [facility] Office  [phone]

## 2020-08-14 DIAGNOSIS — R7881 Bacteremia: Secondary | ICD-10-CM | POA: Diagnosis not present

## 2020-08-14 DIAGNOSIS — I5023 Acute on chronic systolic (congestive) heart failure: Secondary | ICD-10-CM | POA: Diagnosis not present

## 2020-08-14 DIAGNOSIS — O09893 Supervision of other high risk pregnancies, third trimester: Secondary | ICD-10-CM

## 2020-08-14 DIAGNOSIS — L03115 Cellulitis of right lower limb: Secondary | ICD-10-CM | POA: Diagnosis not present

## 2020-08-14 DIAGNOSIS — Z98891 History of uterine scar from previous surgery: Secondary | ICD-10-CM

## 2020-08-14 DIAGNOSIS — R109 Unspecified abdominal pain: Secondary | ICD-10-CM | POA: Diagnosis not present

## 2020-08-14 MED ORDER — DOXYLAMINE SUCCINATE (SLEEP) 25 MG PO TABS
12.5000 mg | ORAL_TABLET | Freq: Two times a day (BID) | ORAL | Status: DC
  Filled 2020-08-14: qty 1

## 2020-08-14 MED ORDER — VITAMIN B-6 25 MG PO TABS
12.5000 mg | ORAL_TABLET | Freq: Two times a day (BID) | ORAL | Status: DC
  Administered 2020-08-14 – 2020-08-18 (×9): 12.5 mg via ORAL
  Filled 2020-08-14 (×10): qty 1

## 2020-08-14 MED ORDER — DOXYLAMINE SUCCINATE (SLEEP) 25 MG PO TABS
12.5000 mg | ORAL_TABLET | Freq: Two times a day (BID) | ORAL | Status: DC
  Administered 2020-08-14 – 2020-08-18 (×9): 12.5 mg via ORAL
  Filled 2020-08-14 (×10): qty 1

## 2020-08-14 MED ORDER — VITAMIN B-6 25 MG PO TABS
12.5000 mg | ORAL_TABLET | Freq: Two times a day (BID) | ORAL | Status: DC
  Filled 2020-08-14: qty 1

## 2020-08-14 NOTE — Progress Notes (Signed)
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 Patient is an active IV heroin user. Echocardiogram showed no obvious vegetation and TEE was deferred. Plan is treat empirically for endocarditis (6weeks of vancomycin). Patient has a PICC line in place which will need to be removed prior to discharge.  Assessment & Plan   MRSA bacteremia with presumed endocarditis -Blood cultures from 07/13/2020 MRSA -Echocardiogram EF 55%, no obvious vegetations -Infectious disease consulted and appreciated, recommended IV vancomycin through 08/25/20- treat empirically  -repeat blood cultures showed no growth -Patient remain in the hospital until completion of 6 weeks of IV antibiotics through 08/26/2018 -Currently has PICC line in place which will need to be removed prior to discharge  Acute on chronic right-sided heart failure with severe TR, severe pulmonary hypertension -Echocardiogram showed a decreased RV systolic function with a PA pressure of 92, severe TR -Repeat echocardiogram on 08/03/2020 showed an EF of 50 to 55%, mobile echodensity likely representing vegetation measuring 1 cm x 0.6 cm on the anterior leaflet of the tricuspid valve -Cardiology and heart failure team consulted and appreciated, patient was transitioned to oral Lasix on 07/23/2020 -Continue Lasix 40 mg daily  -monitor intake and output, daily weights  Anemia of chronic disease -Baseline hemoglobin 8.5-9.5. Hemoglobin currently 8.3 -vitamin B12 445, folate 7.4, iron 32, ferritin 111 -Continue prenatal vitamins -Monitor H&H  History of IV  heroin and fentanyl abuse/chronic pain syndrome with nausea -Patient has now been started on methadone -Discussed with ob/gyn, Dr. Nelda Marseille, will increase methadone to 172m daily -Continue Dilaudid as needed for severe pain and breakthrough pain -Continues to have some nausea this morning, without vomiting, have ordered unisom/B6- will change this to BID -Patient was started on Zoloft 50 mg daily  Pregnancy -Currently [redacted] weeks pregnant -Management per OB/GYN service -Discussed with Dr. ONelda Marseillethis morning, patient is having nausea and vomiting.  She was on Compazine which has been discontinued.  Have placed patient on Phenergan. -As above  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 -Patient received penicillin 2.4 MU IM  Hyponatremia -Sodium 131, continue to monitor BMP  Back pain -MRI of the lumbar spine showed no evidence of discitis or osteomyelitis -Continue pain control as above  Jaw pain/tooth pain dental caries -Patient was on Augmentin, has multiple dental caries and [facility]   DVT Prophylaxis Lovenox  Code Status: Full  Family Communication: None at bedside  Disposition Plan:  Status is: Inpatient  Remains inpatient appropriate because:IV treatments appropriate due to intensity of illness or inability to take PO   Dispo: The patient is from: Home              Anticipated d/c is to: Home              Patient currently is not medically stable to d/c.   Difficult to place patient No  Consultants Cardiology Infectious disease Cardiothoracic surgery OB/GYN  Procedures  Echocardiogram PICC  line placement  Antibiotics   Anti-infectives (From admission, onward)   Start     Dose/Rate Route  Frequency Ordered Stop   08/11/20 1600  ceFAZolin (ANCEF) IVPB 1 g/50 mL premix  Status:  Discontinued        1 g 100 mL/hr over 30 Minutes Intravenous Every 24 hours 08/11/20 1315 08/11/20 1316   07/22/20 1045  amoxicillin-clavulanate (AUGMENTIN) 875-125 MG per tablet 1 tablet        1 tablet Oral Every 12 hours 07/22/20 0952 07/31/20 2023   07/18/20 0830  vancomycin (VANCOCIN) IVPB 750 mg/150 ml premix        750 mg 150 mL/hr over 60 Minutes Intravenous Every 8 hours 07/18/20 0750     07/16/20 0400  Vancomycin (VANCOCIN) 1,250 mg in sodium chloride 0.9 % 250 mL IVPB  Status:  Discontinued        1,250 mg 166.7 mL/hr over 90 Minutes Intravenous Every 8 hours 07/15/20 1944 07/18/20 0749   07/15/20 1715  penicillin g benzathine (BICILLIN LA) 1200000 UNIT/2ML injection 2.4 Million Units        2.4 Million Units Intramuscular  Once 07/15/20 1621 07/15/20 1747   07/14/20 0300  vancomycin (VANCOCIN) IVPB 1000 mg/200 mL premix        1,000 mg 200 mL/hr over 60 Minutes Intravenous Every 8 hours 07/13/20 1806 07/15/20 2059   07/13/20 1730  Vancomycin (VANCOCIN) 1,500 mg in sodium chloride 0.9 % 500 mL IVPB        1,500 mg 250 mL/hr over 120 Minutes Intravenous  Once 07/13/20 1709 07/13/20 2116   07/13/20 1730  ceFEPIme (MAXIPIME) 2 g in sodium chloride 0.9 % 100 mL IVPB  Status:  Discontinued        2 g 200 mL/hr over 30 Minutes Intravenous Every 8 hours 07/13/20 1709 07/14/20 1050   07/13/20 1315  cefTRIAXone (ROCEPHIN) 1 g in sodium chloride 0.9 % 100 mL IVPB        1 g 200 mL/hr over 30 Minutes Intravenous  Once 07/13/20 1306 07/13/20 1538   07/13/20 1315  azithromycin (ZITHROMAX) 500 mg in sodium chloride 0.9 % 250 mL IVPB        500 mg 250 mL/hr over 60 Minutes Intravenous  Once 07/13/20 1306 07/13/20 1644      Subjective:   Kelly [name] seen and examined today.  Continues to complain of nausea this morning, denies current vomiting.  Denies abdominal pain.  Denies chest pain,  shortness of breath, dizziness or headache.    Objective:   Vitals:   08/13/20 1544 08/13/20 2300 08/14/20 0445 08/14/20 1139  BP: 104/76 97/63 104/61 119/82  Pulse: 66 73 63 97  Resp: 16 18 20 18   Temp: 97.8 F (36.6 C) 97.7 F (36.5 C) 97.9 F (36.6 C) 98.6 F (37 C)  TempSrc: Oral Oral Oral Oral  SpO2: 96% 97% 99% 97%  Weight:      Height:        Intake/Output Summary (Last 24 hours) at 08/14/2020 1221 Last data filed at 08/13/2020 2302 Gross per 24 hour  Intake 1210 ml  Output --  Net 1210 ml   Filed Weights   08/11/20 0623 08/12/20 0557 08/13/20 0438  Weight: 84.1 kg 84.7 kg 84.5 kg   Exam  General: Well developed, well nourished, chronically ill-appearing, NAD  HEENT: NCAT, mucous membranes moist.   Cardiovascular: S1 S2 auscultated, RRR, 2/6 SEM  Respiratory: Diminished breath sounds, no wheezing  Abdomen: Soft, nontender, nondistended, +  bowel sounds, +pregnancy  Extremities: warm dry without cyanosis clubbing or edema  Neuro: AAOx3, nonfocal  Psych: appropriate mood and affect, pleasant  Data Reviewed: I have personally reviewed following labs and imaging studies  CBC: Recent Labs  Lab 08/10/20 0258 08/12/20 0336 08/13/20 0258  WBC  --  6.5  --   HGB 8.2* 8.1* 8.3*  HCT 24.7* 24.9* 26.1*  MCV  --  93.3  --   PLT  --  272  --    Basic Metabolic Panel: Recent Labs  Lab 08/09/20 0205 08/10/20 0258 08/12/20 0336 08/13/20 0258  NA 132*  --  133* 131*  K 3.8  --  3.5 3.9  CL 99  --  102 101  CO2 25  --  25 24  GLUCOSE 88  --  89 88  BUN 12  --  11 15  CREATININE 0.49 0.57 0.52 0.56  CALCIUM 9.0  --  9.0 8.7*  MG  --   --  1.6*  --   PHOS  --   --  4.1  --    GFR: Estimated Creatinine Clearance: 110.8 mL/min (by C-G formula based on SCr of 0.56 mg/dL). Liver Function Tests: No results for input(s): AST, ALT, ALKPHOS, BILITOT, PROT, ALBUMIN in the last 168 hours. No results for input(s): LIPASE, AMYLASE in the last 168 hours. No  results for input(s): AMMONIA in the last 168 hours. Coagulation Profile: No results for input(s): INR, PROTIME in the last 168 hours. Cardiac Enzymes: No results for input(s): CKTOTAL, CKMB, CKMBINDEX, TROPONINI in the last 168 hours. BNP (last 3 results) No results for input(s): PROBNP in the last 8760 hours. HbA1C: No results for input(s): HGBA1C in the last 72 hours. CBG: No results for input(s): GLUCAP in the last 168 hours. Lipid Profile: No results for input(s): CHOL, HDL, LDLCALC, TRIG, CHOLHDL, LDLDIRECT in the last 72 hours. Thyroid Function Tests: No results for input(s): TSH, T4TOTAL, FREET4, T3FREE, THYROIDAB in the last 72 hours. Anemia Panel: No results for input(s): VITAMINB12, FOLATE, FERRITIN, TIBC, IRON, RETICCTPCT in the last 72 hours. Urine analysis:    Component Value Date/Time   COLORURINE AMBER (A) 07/13/2020 1320   APPEARANCEUR CLOUDY (A) 07/13/2020 1320   APPEARANCEUR Hazy 07/10/2013 1121   LABSPEC 1.028 07/13/2020 1320   LABSPEC 1.020 07/10/2013 1121   PHURINE 5.0 07/13/2020 1320   GLUCOSEU NEGATIVE 07/13/2020 1320   GLUCOSEU Negative 07/10/2013 1121   HGBUR SMALL (A) 07/13/2020 1320   BILIRUBINUR NEGATIVE 07/13/2020 1320   BILIRUBINUR Negative 07/10/2013 1121   KETONESUR 5 (A) 07/13/2020 1320   PROTEINUR 100 (A) 07/13/2020 1320   UROBILINOGEN 1.0 02/15/2015 0530   NITRITE POSITIVE (A) 07/13/2020 1320   LEUKOCYTESUR LARGE (A) 07/13/2020 1320   LEUKOCYTESUR Trace 07/10/2013 1121   Sepsis Labs: @LABRCNTIP (procalcitonin:4,lacticidven:4)  )No results found for this or any previous visit (from the past 240 hour(s)).    Radiology Studies: No results found.   Scheduled Meds: . sodium chloride   Intravenous Once  . acetaminophen  650 mg Oral Q6H  . Chlorhexidine Gluconate Cloth  6 each Topical Daily  . doxylamine (Sleep)  12.5 mg Oral BID   And  . vitamin B-6  12.5 mg Oral BID  . enoxaparin (LOVENOX) injection  40 mg Subcutaneous Q24H  .  feeding supplement  237 mL Oral BID BM  . furosemide  40 mg Oral Daily  . methadone  120 mg Oral Daily  . polyethylene glycol  17 g Oral BID  .  prenatal vitamin w/FE, FA  1 tablet Oral Q1200  . senna-docusate  1 tablet Oral BID  . sertraline  50 mg Oral Daily  . sodium chloride flush  10-40 mL Intracatheter Q12H   Continuous Infusions: . vancomycin 750 mg (08/14/20 0450)     LOS: 32 days   Time Spent in minutes   30 minutes  Burnie Hank D.O. on 08/14/2020 at 12:21 PM  Between 7am to 7pm - Please see pager noted on [email or web address]  After 7pm go to [email or web address]  And look for the night coverage person covering for me after hours  [facility] Office  [phone]

## 2020-08-14 NOTE — Progress Notes (Signed)
NST reactive. No decels. No uc's tracing. Pt denies vaginal bleeding or leaking of fluid.

## 2020-08-14 NOTE — Progress Notes (Signed)
Spoke with Dr. Rip Harbour. Pt slept through her monitoring today. She would wake up when I spoke to her, but otherwise, she slept. She has done this through previous monitorings. He said he would review the pt's medications. Her RN said that her methadone had been increased and they had added unisom, po phenergan for nausea.

## 2020-08-14 NOTE — Progress Notes (Signed)
Pharmacy Antibiotic Note  Kelly [name] is a 35 y.o. female with MRSA bacteremia  And TV endocarditis. 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  Pharmacy has been consulted for vancomycin. Patient is pregnant with EGA of [redacted] weeks.  Scr remains stable at 0.56 (CrCl >100 mL/min). Remains afebrile. Planning 6 weeks of inpatient IV antibiotics. Patient clinically improving. Most recent vancomycin levels on 3/31 appropriate (VT=16 ug/mL).   Plan: Continue vancomycin to 750 mg IV q 8 hrs Monitor C&S, clinical status, renal function, VT as needed Plan for levels as needed    Temp (24hrs), Avg:97.9 F (36.6 C), Min:97.7 F (36.5 C), Max:98.2 F (36.8 C)  Recent Labs  Lab 08/09/20 0205 08/10/20 0258 08/10/20 1236 08/11/20 1144 08/12/20 0336 08/13/20 0258  WBC  --   --   --   --  6.5  --   CREATININE 0.49 0.57  --   --  0.52 0.56  VANCOTROUGH  --   --  39* 16  --   --     Estimated Creatinine Clearance: 110.8 mL/min (by C-G formula based on SCr of 0.56 mg/dL).    Allergies  Allergen Reactions  . Hydrocodone Itching  . Morphine And Related Nausea And Vomiting    Antimicrobials this admission: Azith 3/2 x1 CTX 3/2 x1 Vancomycin 3/2> Cefepime 3/2> 3/3  Cultures: 3/2 urine - ng 3/2 Bld - GPC - MRSA 3/2 MRSA PCR pos 3/4 BCx x 2 > Neg  Thank you for allowing pharmacy to be a part of this patient's care.  Wilson Singer, PharmD PGY1 Pharmacy Resident 08/14/2020 10:45 AM

## 2020-08-15 DIAGNOSIS — R109 Unspecified abdominal pain: Secondary | ICD-10-CM | POA: Diagnosis not present

## 2020-08-15 DIAGNOSIS — R7881 Bacteremia: Secondary | ICD-10-CM | POA: Diagnosis not present

## 2020-08-15 DIAGNOSIS — L03115 Cellulitis of right lower limb: Secondary | ICD-10-CM | POA: Diagnosis not present

## 2020-08-15 DIAGNOSIS — I5023 Acute on chronic systolic (congestive) heart failure: Secondary | ICD-10-CM | POA: Diagnosis not present

## 2020-08-15 LAB — CBC
HCT: 27.7 % — ABNORMAL LOW (ref 36.0–46.0)
Hemoglobin: 8.9 g/dL — ABNORMAL LOW (ref 12.0–15.0)
MCH: 29.8 pg (ref 26.0–34.0)
MCHC: 32.1 g/dL (ref 30.0–36.0)
MCV: 92.6 fL (ref 80.0–100.0)
Platelets: 295 10*3/uL (ref 150–400)
RBC: 2.99 MIL/uL — ABNORMAL LOW (ref 3.87–5.11)
RDW: 16.4 % — ABNORMAL HIGH (ref 11.5–15.5)
WBC: 6.8 10*3/uL (ref 4.0–10.5)
nRBC: 0 % (ref 0.0–0.2)

## 2020-08-15 LAB — BASIC METABOLIC PANEL
Anion gap: 8 (ref 5–15)
BUN: 16 mg/dL (ref 6–20)
CO2: 26 mmol/L (ref 22–32)
Calcium: 9.1 mg/dL (ref 8.9–10.3)
Chloride: 98 mmol/L (ref 98–111)
Creatinine, Ser: 0.59 mg/dL (ref 0.44–1.00)
GFR, Estimated: >60 mL/min (ref >60)
Glucose, Bld: 91 mg/dL (ref 70–99)
Potassium: 3.8 mmol/L (ref 3.5–5.1)
Sodium: 132 mmol/L — ABNORMAL LOW (ref 135–145)

## 2020-08-15 MED ORDER — LACTATED RINGERS IV BOLUS
500.0000 mL | Freq: Once | INTRAVENOUS | Status: AC
  Administered 2020-08-15: 500 mL via INTRAVENOUS

## 2020-08-15 NOTE — Progress Notes (Signed)
Dr. Nelda Marseille and Dr. Dione Plover reviewed the [name] tracing.

## 2020-08-15 NOTE — Progress Notes (Signed)
Pt notified  RN that she went to the BR and had a copious amount of mucus/discharge that came out. OBRR Mary,RN paged and coming to assess the patient

## 2020-08-15 NOTE — Progress Notes (Signed)
FACULTY PRACTICE ANTEPARTUM(COMPREHENSIVE) NOTE  Kelly [name] is a 35 y.o. [phone] with Estimated Date of Delivery: 10/18/20   By  midtrimester ultrasound [redacted]w[redacted]d who is admitted for MRSA bacteremia, endocarditis with severe tricuspid regurgitation, septic emboli with h/o discitis/epidural abscess, chronic HTN and acute on chronic right sided heart failure in the setting of IV heroin and fentanyl abuse.    Fetal presentation is transverse. Length of Stay:  33  Days  Date of admission:07/13/2020  Subjective: Pt sleeping comfortably upon arrival in her room.  She does not occasional contractions- describes it as mild cramping like pain.  Pain is infrequent and irregular.  She reported some mucus-like vaginal discharge x 1, denies LOF.  No further discharge today.  Denies vaginal itching or odor. Patient reports the fetal movement as active. Patient reports  vaginal bleeding as none. Patient describes fluid per vagina as None.  Vitals:  Blood pressure 100/63, pulse 74, temperature (!) 97.5 F (36.4 C), temperature source Oral, resp. rate 16, height 5' 7.5" (1.715 m), weight 84.9 kg, SpO2 98 %, not currently breastfeeding. Vitals:   08/14/20 1647 08/14/20 2337 08/15/20 0450 08/15/20 1311  BP: 108/64 99/65 104/69 100/63  Pulse: 69 62 67 74  Resp: 18 18 18 16   Temp: 98.1 F (36.7 C) 98.4 F (36.9 C) 97.7 F (36.5 C) (!) 97.5 F (36.4 C)  TempSrc: Oral Oral Oral Oral  SpO2: 98% 97% 97% 98%  Weight:   84.9 kg   Height:       Physical Examination:  General appearance - alert, well appearing, drowsy this afternoon Mental status - alert, oriented to person, place, and time Chest - normal respiratory effort, CTAB Heart - normal rate and regular rhythm Abdomen - gravid, soft and non-tender, no rebound, no guarding Extremities - no pedal edema noted Skin - warm and dry  Fetal Monitoring:  Baseline: 130 bpm, Variability: moderate, Accelerations: no accels, and Decelerations: no decels     nonreactive but appropriate for current gestational age  Labs:  Results for orders placed or performed during the hospital encounter of 07/13/20 (from the past 24 hour(s))  CBC   Collection Time: 08/15/20  4:11 AM  Result Value Ref Range   WBC 6.8 4.0 - 10.5 K/uL   RBC 2.99 (L) 3.87 - 5.11 MIL/uL   Hemoglobin 8.9 (L) 12.0 - 15.0 g/dL   HCT 27.7 (L) 36.0 - 46.0 %   MCV 92.6 80.0 - 100.0 fL   MCH 29.8 26.0 - 34.0 pg   MCHC 32.1 30.0 - 36.0 g/dL   RDW 16.4 (H) 11.5 - 15.5 %   Platelets 295 150 - 400 K/uL   nRBC 0.0 0.0 - 0.2 %  Basic metabolic panel   Collection Time: 08/15/20  4:11 AM  Result Value Ref Range   Sodium 132 (L) 135 - 145 mmol/L   Potassium 3.8 3.5 - 5.1 mmol/L   Chloride 98 98 - 111 mmol/L   CO2 26 22 - 32 mmol/L   Glucose, Bld 91 70 - 99 mg/dL   BUN 16 6 - 20 mg/dL   Creatinine, Ser 0.59 0.44 - 1.00 mg/dL   Calcium 9.1 8.9 - 10.3 mg/dL   GFR, Estimated >60 >60 mL/min   Anion gap 8 5 - 15    Imaging Studies:    No results found.   Medications:  Scheduled . sodium chloride   Intravenous Once  . acetaminophen  650 mg Oral Q6H  . Chlorhexidine Gluconate Cloth  6 each Topical Daily  . doxylamine (Sleep)  12.5 mg Oral BID   And  . vitamin B-6  12.5 mg Oral BID  . enoxaparin (LOVENOX) injection  40 mg Subcutaneous Q24H  . feeding supplement  237 mL Oral BID BM  . furosemide  40 mg Oral Daily  . methadone  120 mg Oral Daily  . polyethylene glycol  17 g Oral BID  . prenatal vitamin w/FE, FA  1 tablet Oral Q1200  . senna-docusate  1 tablet Oral BID  . sertraline  50 mg Oral Daily  . sodium chloride flush  10-40 mL Intracatheter Q12H   I have reviewed the patient's current medications.  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  Pelvic exam completed by OB RN- cervix closed- no discharge or blood noted on exam.  No evidence of labor, will continue to monitor closely -etiology of nausea/vomiting remain unclear though symptoms well controlled with phenergan prn and B12/unisom -doing well with current methadone dosing -appreciate management per IM, ID and cardiology -Plan for for TOLAC and BTL-to be completed around 37wks -outpatient follow up appointments with OB scheduled -Appreciate involvement of social work as plans to transition to [facility] upon discharge  Please call [phone] [facility] OB[facility]GYN Consult Attending Monday-Friday 8am - 5pm) or [phone] [name] OB/GYN Attending On Call all day, every day) for any obstetric concerns at any time.   Janyth Pupa, DO Attending [place], [facility] for [employer], [place]

## 2020-08-15 NOTE — Progress Notes (Signed)
Audible fetal movement heard. Pt feels her baby moving, but the tracing isn't as reactive as yesterday or the day before. She has drank juice and says she has emptied her bladder before I got here. Uc's noted on tracing. Pt says she feels them as intermittent   Lower abd cramping and tightening. I was unable to palpate uc's. DrMarland Kitchen Dione Plover  Notified. Orders received for 595m bolus of LR and to check pt's cervix.

## 2020-08-15 NOTE — Progress Notes (Signed)
Received a call from Pt's RN that pt had c/o passing a lg amt of mucous discharge in the toilet. Pt denies feeling wet or having a gush of fluid. She denies vaginal bleeding. Pt placed on EFM.

## 2020-08-15 NOTE — Progress Notes (Signed)
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 Patient is an active IV heroin user. Echocardiogram showed no obvious vegetation and TEE was deferred. Plan is treat empirically for endocarditis (6weeks of vancomycin). Patient has a PICC line in place which will need to be removed prior to discharge.  Assessment & Plan   MRSA bacteremia with presumed endocarditis -Blood cultures from 07/13/2020 MRSA -Echocardiogram EF 55%, no obvious vegetations -Infectious disease consulted and appreciated, recommended IV vancomycin through 08/25/20- treat empirically  -repeat blood cultures showed no growth -Patient remain in the hospital until completion of 6 weeks of IV antibiotics through 08/26/2018 -Currently has PICC line in place which will need to be removed prior to discharge  Acute on chronic right-sided heart failure with severe TR, severe pulmonary hypertension -Echocardiogram showed a decreased RV systolic function with a PA pressure of 92, severe TR -Repeat echocardiogram on 08/03/2020 showed an EF of 50 to 55%, mobile echodensity likely representing vegetation measuring 1 cm x 0.6 cm on the anterior leaflet of the tricuspid valve -Cardiology and heart failure team consulted and appreciated, patient was transitioned to oral Lasix on 07/23/2020 -Continue Lasix 40 mg daily  -monitor intake and output, daily weights  Anemia of chronic disease -Baseline hemoglobin 8.5-9.5. Hemoglobin currently 8.9 -vitamin B12 445, folate 7.4, iron 32, ferritin 111 -Continue prenatal vitamins -Monitor H&H  History of IV  heroin and fentanyl abuse/chronic pain syndrome with nausea -Patient has now been started on methadone -Discussed with ob/gyn, Dr. Nelda Marseille, will increase methadone to 155m daily -Continue Dilaudid as needed for severe pain and breakthrough pain -Nausea has improved with Unisom/B6 combination -Patient was started on Zoloft 50 mg daily  Pregnancy -Currently 30+ weeks pregnant -Management per OB/GYN service -Discussed with Dr. ONelda Marseillethis morning, patient is having nausea and vomiting.  She was on Compazine which has been discontinued.  Have placed patient on Phenergan. 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 -Patient received penicillin 2.4 MU IM  Hyponatremia -Sodium 132, continue to monitor BMP  Back pain -MRI of the lumbar spine showed no evidence of discitis or osteomyelitis -Continue pain control as above  Jaw pain/tooth pain dental caries -Patient was on Augmentin, has multiple dental caries and will [facility] outpatient   DVT Prophylaxis Lovenox  Code Status: Full  Family Communication: None at bedside  Disposition Plan:  Status is: Inpatient  Remains inpatient appropriate because:IV treatments appropriate due to intensity of illness or inability to take PO   Dispo: The patient is from: Home              Anticipated d/c is to: Home              Patient currently is not medically stable to d/c.   Difficult to place patient No  Consultants Cardiology Infectious disease Cardiothoracic surgery OB/GYN  Procedures  Echocardiogram  PICC line placement  Antibiotics   Anti-infectives (From admission, onward)   Start      Dose/Rate Route Frequency Ordered Stop   08/11/20 1600  ceFAZolin (ANCEF) IVPB 1 g/50 mL premix  Status:  Discontinued        1 g 100 mL/hr over 30 Minutes Intravenous Every 24 hours 08/11/20 1315 08/11/20 1316   07/22/20 1045  amoxicillin-clavulanate (AUGMENTIN) 875-125 MG per tablet 1 tablet        1 tablet Oral Every 12 hours 07/22/20 0952 07/31/20 2023   07/18/20 0830  vancomycin (VANCOCIN) IVPB 750 mg/150 ml premix        750 mg 150 mL/hr over 60 Minutes Intravenous Every 8 hours 07/18/20 0750     07/16/20 0400  Vancomycin (VANCOCIN) 1,250 mg in sodium chloride 0.9 % 250 mL IVPB  Status:  Discontinued        1,250 mg 166.7 mL/hr over 90 Minutes Intravenous Every 8 hours 07/15/20 1944 07/18/20 0749   07/15/20 1715  penicillin g benzathine (BICILLIN LA) 1200000 UNIT/2ML injection 2.4 Million Units        2.4 Million Units Intramuscular  Once 07/15/20 1621 07/15/20 1747   07/14/20 0300  vancomycin (VANCOCIN) IVPB 1000 mg/200 mL premix        1,000 mg 200 mL/hr over 60 Minutes Intravenous Every 8 hours 07/13/20 1806 07/15/20 2059   07/13/20 1730  Vancomycin (VANCOCIN) 1,500 mg in sodium chloride 0.9 % 500 mL IVPB        1,500 mg 250 mL/hr over 120 Minutes Intravenous  Once 07/13/20 1709 07/13/20 2116   07/13/20 1730  ceFEPIme (MAXIPIME) 2 g in sodium chloride 0.9 % 100 mL IVPB  Status:  Discontinued        2 g 200 mL/hr over 30 Minutes Intravenous Every 8 hours 07/13/20 1709 07/14/20 1050   07/13/20 1315  cefTRIAXone (ROCEPHIN) 1 g in sodium chloride 0.9 % 100 mL IVPB        1 g 200 mL/hr over 30 Minutes Intravenous  Once 07/13/20 1306 07/13/20 1538   07/13/20 1315  azithromycin (ZITHROMAX) 500 mg in sodium chloride 0.9 % 250 mL IVPB        500 mg 250 mL/hr over 60 Minutes Intravenous  Once 07/13/20 1306 07/13/20 1644      Subjective:   Kelly [name] seen and examined today.  Able to eat breakfast without nausea today. Denies current chest pain, shortness of breath. Has some  abdominal discomfort. Denies current dizziness or headache. Continue to have oral pain.   Objective:   Vitals:   08/14/20 1139 08/14/20 1647 08/14/20 2337 08/15/20 0450  BP: 119/82 108/64 99/65 104/69  Pulse: 97 69 62 67  Resp: 18 18 18 18   Temp: 98.6 F (37 C) 98.1 F (36.7 C) 98.4 F (36.9 C) 97.7 F (36.5 C)  TempSrc: Oral Oral Oral Oral  SpO2: 97% 98% 97% 97%  Weight:    84.9 kg  Height:        Intake/Output Summary (Last 24 hours) at 08/15/2020 1213 Last data filed at 08/15/2020 0453 Gross per 24 hour  Intake 480 ml  Output --  Net 480 ml   Filed Weights   08/12/20 0557 08/13/20 0438 08/15/20 0450  Weight: 84.7 kg 84.5 kg 84.9 kg   Exam  General: Well developed, well nourished, chronically ill-appearing, NAD  HEENT: NCAT, mucous membranes moist.   Cardiovascular: S1 S2 auscultated, RRR, 2/6 SEM  Respiratory: Diminished breath sounds, no wheezing  Abdomen: Soft, nontender, nondistended, + bowel sounds, +pregnancy  Extremities: warm dry without cyanosis clubbing or edema  Neuro: AAOx3, nonfocal  Psych: appropriate mood and affect, pleasant  Data Reviewed: I have personally reviewed following labs and imaging studies  CBC: Recent Labs  Lab 08/10/20 0258 08/12/20 0336 08/13/20 0258 08/15/20 0411  WBC  --  6.5  --  6.8  HGB 8.2* 8.1* 8.3* 8.9*  HCT 24.7* 24.9* 26.1* 27.7*  MCV  --  93.3  --  92.6  PLT  --  272  --  086   Basic Metabolic Panel: Recent Labs  Lab 08/09/20 0205 08/10/20 0258 08/12/20 0336 08/13/20 0258 08/15/20 0411  NA 132*  --  133* 131* 132*  K 3.8  --  3.5 3.9 3.8  CL 99  --  102 101 98  CO2 25  --  25 24 26   GLUCOSE 88  --  89 88 91  BUN 12  --  11 15 16   CREATININE 0.49 0.57 0.52 0.56 0.59  CALCIUM 9.0  --  9.0 8.7* 9.1  MG  --   --  1.6*  --   --   PHOS  --   --  4.1  --   --    GFR: Estimated Creatinine Clearance: 110.9 mL/min (by C-G formula based on SCr of 0.59 mg/dL). Liver Function Tests: No results for  input(s): AST, ALT, ALKPHOS, BILITOT, PROT, ALBUMIN in the last 168 hours. No results for input(s): LIPASE, AMYLASE in the last 168 hours. No results for input(s): AMMONIA in the last 168 hours. Coagulation Profile: No results for input(s): INR, PROTIME in the last 168 hours. Cardiac Enzymes: No results for input(s): CKTOTAL, CKMB, CKMBINDEX, TROPONINI in the last 168 hours. BNP (last 3 results) No results for input(s): PROBNP in the last 8760 hours. HbA1C: No results for input(s): HGBA1C in the last 72 hours. CBG: No results for input(s): GLUCAP in the last 168 hours. Lipid Profile: No results for input(s): CHOL, HDL, LDLCALC, TRIG, CHOLHDL, LDLDIRECT in the last 72 hours. Thyroid Function Tests: No results for input(s): TSH, T4TOTAL, FREET4, T3FREE, THYROIDAB in the last 72 hours. Anemia Panel: No results for input(s): VITAMINB12, FOLATE, FERRITIN, TIBC, IRON, RETICCTPCT in the last 72 hours. Urine analysis:    Component Value Date/Time   COLORURINE AMBER (A) 07/13/2020 1320   APPEARANCEUR CLOUDY (A) 07/13/2020 1320   APPEARANCEUR Hazy 07/10/2013 1121   LABSPEC 1.028 07/13/2020 1320   LABSPEC 1.020 07/10/2013 1121   PHURINE 5.0 07/13/2020 1320   GLUCOSEU NEGATIVE 07/13/2020 1320   GLUCOSEU Negative 07/10/2013 1121   HGBUR SMALL (A) 07/13/2020 1320   BILIRUBINUR NEGATIVE 07/13/2020 1320   BILIRUBINUR Negative 07/10/2013 1121   KETONESUR 5 (A) 07/13/2020 1320   PROTEINUR 100 (A) 07/13/2020 1320   UROBILINOGEN 1.0 02/15/2015 0530   NITRITE POSITIVE (A) 07/13/2020 1320   LEUKOCYTESUR LARGE (A) 07/13/2020 1320   LEUKOCYTESUR Trace 07/10/2013 1121   Sepsis Labs: @LABRCNTIP (procalcitonin:4,lacticidven:4)  )No results found for this or any previous visit (from the past 240 hour(s)).    Radiology Studies: No results found.   Scheduled Meds: . sodium chloride   Intravenous Once  . acetaminophen  650 mg Oral Q6H  . Chlorhexidine Gluconate Cloth  6 each Topical Daily  .  doxylamine (Sleep)  12.5 mg Oral BID   And  . vitamin B-6  12.5 mg Oral BID  . enoxaparin (LOVENOX) injection  40 mg Subcutaneous Q24H  . feeding supplement  237 mL  Oral BID BM  . furosemide  40 mg Oral Daily  . methadone  120 mg Oral Daily  . polyethylene glycol  17 g Oral BID  . prenatal vitamin w/FE, FA  1 tablet Oral Q1200  . senna-docusate  1 tablet Oral BID  . sertraline  50 mg Oral Daily  . sodium chloride flush  10-40 mL Intracatheter Q12H   Continuous Infusions: . vancomycin 750 mg (08/15/20 0602)     LOS: 33 days   Time Spent in minutes   30 minutes  Atley Scarboro D.O. on 08/15/2020 at 12:13 PM  Between 7am to 7pm - Please see pager noted on [email or web address]  After 7pm go to [email or web address]  And look for the night coverage person covering for me after hours  [facility] Office  [phone]

## 2020-08-16 DIAGNOSIS — R109 Unspecified abdominal pain: Secondary | ICD-10-CM | POA: Diagnosis not present

## 2020-08-16 DIAGNOSIS — I5023 Acute on chronic systolic (congestive) heart failure: Secondary | ICD-10-CM | POA: Diagnosis not present

## 2020-08-16 DIAGNOSIS — O10919 Unspecified pre-existing hypertension complicating pregnancy, unspecified trimester: Secondary | ICD-10-CM | POA: Diagnosis not present

## 2020-08-16 DIAGNOSIS — R7881 Bacteremia: Secondary | ICD-10-CM | POA: Diagnosis not present

## 2020-08-16 MED ORDER — VANCOMYCIN HCL IN DEXTROSE 750-5 MG/150ML-% IV SOLN
750.0000 mg | Freq: Three times a day (TID) | INTRAVENOUS | Status: DC
  Administered 2020-08-17 – 2020-08-19 (×8): 750 mg via INTRAVENOUS
  Filled 2020-08-16 (×11): qty 150

## 2020-08-16 MED ORDER — VANCOMYCIN HCL 1000 MG IV SOLR
750.0000 mg | Freq: Three times a day (TID) | INTRAVENOUS | Status: DC
  Administered 2020-08-16 (×2): 750 mg via INTRAVENOUS
  Filled 2020-08-16 (×5): qty 750

## 2020-08-16 NOTE — Progress Notes (Signed)
NST was reassuring for gestation.  Pt reports same vaginal discharge that she had yesterday.  No LOF or vaginal bleeding.  Pt reports positive fetal movement.  Dr Nelda Marseille reviews tracing and is aware of vaginal discharge.

## 2020-08-16 NOTE — Progress Notes (Signed)
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 Patient is an active IV heroin user. Echocardiogram showed no obvious vegetation and TEE was deferred. Plan is treat empirically for endocarditis (6weeks of vancomycin). Patient has a PICC line in place which will need to be removed prior to discharge.  Assessment & Plan   MRSA bacteremia with presumed endocarditis -Blood cultures from 07/13/2020 MRSA -Echocardiogram EF 55%, no obvious vegetations -Infectious disease consulted and appreciated, recommended IV vancomycin through 08/25/20- treat empirically  -repeat blood cultures showed no growth -Patient remain in the hospital until completion of 6 weeks of IV antibiotics through 08/26/2018 -Currently has PICC line in place which will need to be removed prior to discharge  Acute on chronic right-sided heart failure with severe TR, severe pulmonary hypertension -Echocardiogram showed a decreased RV systolic function with a PA pressure of 92, severe TR -Repeat echocardiogram on 08/03/2020 showed an EF of 50 to 55%, mobile echodensity likely representing vegetation measuring 1 cm x 0.6 cm on the anterior leaflet of the tricuspid valve -Cardiology and heart failure team consulted and appreciated, patient was transitioned to oral Lasix on 07/23/2020 -Continue Lasix 40 mg daily  -monitor intake and output, daily weights  Anemia of chronic disease -Baseline hemoglobin 8.5-9.5. Hemoglobin currently 8.9 -vitamin B12 445, folate 7.4, iron 32, ferritin 111 -Continue prenatal vitamins -Monitor H&H  History of IV  heroin and fentanyl abuse/chronic pain syndrome with nausea -Patient has now been started on methadone -Discussed with ob/gyn, Dr. Nelda Marseille, will increase methadone to 159m daily -Continue Dilaudid as needed for severe pain and breakthrough pain -Nausea has improved with Unisom/B6 combination -Patient was started on Zoloft 50 mg daily  Pregnancy -Currently 30+ weeks pregnant -Management per OB/GYN service -Discussed with Dr. ONelda Marseillethis morning, patient is having nausea and vomiting.  She was on Compazine which has been discontinued.  Have placed patient on Phenergan. -As above -Patient noted to have a copious amount of mucus/discharge 4/4, OB evaluated patient- this is a physiological finding  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 -Patient received penicillin 2.4 MU IM  Hyponatremia -Sodium 132, continue to monitor BMP  Back pain -MRI of the lumbar spine showed no evidence of discitis or osteomyelitis -Continue pain control as above  Jaw pain/tooth pain dental caries -Patient was on Augmentin, has multiple dental caries and will follow [facility] as an outpatient   DVT Prophylaxis Lovenox  Code Status: Full  Family Communication: None at bedside  Disposition Plan:  Status is: Inpatient  Remains inpatient appropriate because:IV treatments appropriate due to intensity of illness or inability to take PO   Dispo: The patient is from: Home              Anticipated d/c is to: Home              Patient currently is not medically stable to d/c.   Difficult to place patient No  Consultants Cardiology Infectious disease Cardiothoracic  surgery OB/GYN  Procedures  Echocardiogram PICC line placement  Antibiotics   Anti-infectives  (From admission, onward)   Start     Dose/Rate Route Frequency Ordered Stop   08/16/20 1200  vancomycin (VANCOCIN) 750 mg in sodium chloride 0.9 % 250 mL IVPB        750 mg 250 mL/hr over 60 Minutes Intravenous Every 8 hours 08/16/20 0448     08/11/20 1600  ceFAZolin (ANCEF) IVPB 1 g/50 mL premix  Status:  Discontinued        1 g 100 mL/hr over 30 Minutes Intravenous Every 24 hours 08/11/20 1315 08/11/20 1316   07/22/20 1045  amoxicillin-clavulanate (AUGMENTIN) 875-125 MG per tablet 1 tablet        1 tablet Oral Every 12 hours 07/22/20 0952 07/31/20 2023   07/18/20 0830  vancomycin (VANCOCIN) IVPB 750 mg/150 ml premix  Status:  Discontinued        750 mg 150 mL/hr over 60 Minutes Intravenous Every 8 hours 07/18/20 0750 08/16/20 0448   07/16/20 0400  Vancomycin (VANCOCIN) 1,250 mg in sodium chloride 0.9 % 250 mL IVPB  Status:  Discontinued        1,250 mg 166.7 mL/hr over 90 Minutes Intravenous Every 8 hours 07/15/20 1944 07/18/20 0749   07/15/20 1715  penicillin g benzathine (BICILLIN LA) 1200000 UNIT/2ML injection 2.4 Million Units        2.4 Million Units Intramuscular  Once 07/15/20 1621 07/15/20 1747   07/14/20 0300  vancomycin (VANCOCIN) IVPB 1000 mg/200 mL premix        1,000 mg 200 mL/hr over 60 Minutes Intravenous Every 8 hours 07/13/20 1806 07/15/20 2059   07/13/20 1730  Vancomycin (VANCOCIN) 1,500 mg in sodium chloride 0.9 % 500 mL IVPB        1,500 mg 250 mL/hr over 120 Minutes Intravenous  Once 07/13/20 1709 07/13/20 2116   07/13/20 1730  ceFEPIme (MAXIPIME) 2 g in sodium chloride 0.9 % 100 mL IVPB  Status:  Discontinued        2 g 200 mL/hr over 30 Minutes Intravenous Every 8 hours 07/13/20 1709 07/14/20 1050   07/13/20 1315  cefTRIAXone (ROCEPHIN) 1 g in sodium chloride 0.9 % 100 mL IVPB        1 g 200 mL/hr over 30 Minutes Intravenous  Once 07/13/20 1306 07/13/20 1538   07/13/20 1315  azithromycin (ZITHROMAX) 500 mg in sodium chloride 0.9 % 250 mL IVPB        500 mg 250  mL/hr over 60 Minutes Intravenous  Once 07/13/20 1306 07/13/20 1644      Subjective:   Kelly [name] seen and examined today.  No complaints this morning.  Denies any nausea or vomiting.  Denies chest pain or shortness of breath, abdominal pain, dizziness or headache.    Objective:   Vitals:   08/15/20 1311 08/15/20 1852 08/16/20 0017 08/16/20 0534  BP: 100/63 100/65 95/68 113/70  Pulse: 74 72 74 70  Resp: 16 16 18 18   Temp: (!) 97.5 F (36.4 C) 97.6 F (36.4 C) 97.8 F (36.6 C) 97.8 F (36.6 C)  TempSrc: Oral Oral Oral Oral  SpO2: 98% 95% 97% 98%  Weight:    84.6 kg  Height:        Intake/Output Summary (Last 24 hours) at 08/16/2020 1154 Last data filed at 08/16/2020 0300 Gross per 24 hour  Intake 2994.73 ml  Output --  Net 2994.73 ml   Filed Weights   08/13/20 0438  08/15/20 0450 08/16/20 0534  Weight: 84.5 kg 84.9 kg 84.6 kg   Exam  General: Well developed, well nourished, chronically ill-appearing, NAD  HEENT: NCAT,  mucous membranes moist.   Neuro: AAOx3, nonfocal  Psych: appropriate mood and affect, pleasant  Data Reviewed: I have personally reviewed following labs and imaging studies  CBC: Recent Labs  Lab 08/10/20 0258 08/12/20 0336 08/13/20 0258 08/15/20 0411  WBC  --  6.5  --  6.8  HGB 8.2* 8.1* 8.3* 8.9*  HCT 24.7* 24.9* 26.1* 27.7*  MCV  --  93.3  --  92.6  PLT  --  272  --  275   Basic Metabolic Panel: Recent Labs  Lab 08/10/20 0258 08/12/20 0336 08/13/20 0258 08/15/20 0411  NA  --  133* 131* 132*  K  --  3.5 3.9 3.8  CL  --  102 101 98  CO2  --  25 24 26   GLUCOSE  --  89 88 91  BUN  --  11 15 16   CREATININE 0.57 0.52 0.56 0.59  CALCIUM  --  9.0 8.7* 9.1  MG  --  1.6*  --   --   PHOS  --  4.1  --   --    GFR: Estimated Creatinine Clearance: 110.8 mL/min (by C-G formula based on SCr of 0.59 mg/dL). Liver Function Tests: No results for input(s): AST, ALT, ALKPHOS, BILITOT, PROT, ALBUMIN in the last 168 hours. No results for  input(s): LIPASE, AMYLASE in the last 168 hours. No results for input(s): AMMONIA in the last 168 hours. Coagulation Profile: No results for input(s): INR, PROTIME in the last 168 hours. Cardiac Enzymes: No results for input(s): CKTOTAL, CKMB, CKMBINDEX, TROPONINI in the last 168 hours. BNP (last 3 results) No results for input(s): PROBNP in the last 8760 hours. HbA1C: No results for input(s): HGBA1C in the last 72 hours. CBG: No results for input(s): GLUCAP in the last 168 hours. Lipid Profile: No results for input(s): CHOL, HDL, LDLCALC, TRIG, CHOLHDL, LDLDIRECT in the last 72 hours. Thyroid Function Tests: No results for input(s): TSH, T4TOTAL, FREET4, T3FREE, THYROIDAB in the last 72 hours. Anemia Panel: No results for input(s): VITAMINB12, FOLATE, FERRITIN, TIBC, IRON, RETICCTPCT in the last 72 hours. Urine analysis:    Component Value Date/Time   COLORURINE AMBER (A) 07/13/2020 1320   APPEARANCEUR CLOUDY (A) 07/13/2020 1320   APPEARANCEUR Hazy 07/10/2013 1121   LABSPEC 1.028 07/13/2020 1320   LABSPEC 1.020 07/10/2013 1121   PHURINE 5.0 07/13/2020 1320   GLUCOSEU NEGATIVE 07/13/2020 1320   GLUCOSEU Negative 07/10/2013 1121   HGBUR SMALL (A) 07/13/2020 1320   BILIRUBINUR NEGATIVE 07/13/2020 1320   BILIRUBINUR Negative 07/10/2013 1121   KETONESUR 5 (A) 07/13/2020 1320   PROTEINUR 100 (A) 07/13/2020 1320   UROBILINOGEN 1.0 02/15/2015 0530   NITRITE POSITIVE (A) 07/13/2020 1320   LEUKOCYTESUR LARGE (A) 07/13/2020 1320   LEUKOCYTESUR Trace 07/10/2013 1121   Sepsis Labs: @LABRCNTIP (procalcitonin:4,lacticidven:4)  )No results found for this or any previous visit (from the past 240 hour(s)).    Radiology Studies: No results found.   Scheduled Meds: . sodium chloride   Intravenous Once  . acetaminophen  650 mg Oral Q6H  . Chlorhexidine Gluconate Cloth  6 each Topical Daily  . doxylamine (Sleep)  12.5 mg Oral BID   And  . vitamin B-6  12.5 mg Oral BID  . enoxaparin  (LOVENOX) injection  40 mg Subcutaneous Q24H  . feeding supplement  237 mL Oral  BID BM  . furosemide  40 mg Oral Daily  . methadone  120 mg Oral Daily  . polyethylene glycol  17 g Oral BID  . prenatal vitamin w/FE, FA  1 tablet Oral Q1200  . senna-docusate  1 tablet Oral BID  . sertraline  50 mg Oral Daily  . sodium chloride flush  10-40 mL Intracatheter Q12H   Continuous Infusions: . vancomycin 750 mg (08/16/20 1142)     LOS: 34 days   Time Spent in minutes   30 minutes  Kelly [name] D.O. on 08/16/2020 at 11:54 AM  Between 7am to 7pm - Please see pager noted on [email or web address]  After 7pm go to [email or web address]  And look for the night coverage person covering for me after hours  [facility] Office  [phone]

## 2020-08-16 NOTE — TOC Progression Note (Signed)
Transition of Care (TOC) - Progression Note  Heart Failure   Patient Details  Name: Kelly [name] MRN: [id number] Date of Birth: 1985-06-24  Transition of Care [facility]) CM/SW [place], [place] Phone Number: 08/16/2020, 3:37 PM  Clinical Narrative:    CSW received a call from Philis Nettle at [facility] [phone]) who reported that they do not have capacity at this time to accept Ms. Moroz into their program due to Production manager. Ms. Rosiland Oz stated that maybe by the time Ms. Coventry has the baby they might have an opening and can keep Ms. Amrhein on the list. Ms. Rosiland Oz reported she will e-mail the CSW a list of the other 8 programs in the state as well. CSW will inform Ms. Switzer and [employer] with [employer] [facility] [employer] regarding the [employer].      Barriers to Discharge: Continued Medical Work [facility] Resources with medical needs,Unsafe home situation,Financial Resources,Active Substance Use - Placement  Expected Discharge Plan and Services   In-house Referral: Clinical Social Work Discharge Planning Services: [employer] Consult                                           Social Determinants of Health (SDOH) Interventions Food Insecurity Interventions: Other (Comment) (Pt. reports having food stamps) Financial Strain Interventions: Intervention Not Indicated,Other (Comment) (CSW working on connecting Pt. to resources) Housing Interventions: Other (Comment) (Referral to outpatient residential treatment program for pregnant women) Transportation Interventions: [facility] Alcohol Brief Interventions/Follow-up: Continued Monitoring (Pt. reports that she hasn't had a drink since her pregnancy) Depression Interventions/Treatment : Counseling,Medication (Pt. reports she has been on medication before and needs to get back on medication and she is open to counseling and meds.)  Readmission Risk Interventions Readmission Risk  Prevention Plan 07/30/2018  Transportation Screening Complete  Medication Review Press photographer) Complete  PCP or Specialist appointment within 3-5 days of discharge Complete  HRI or [place] Not Complete  HRI or Home Care Consult Pt Refusal Comments N/A  SW Recovery Care/Counseling Consult Complete  Palliative Care Screening Not Complete  Comments N/A  [place] Not Complete  SNF Comments N/A  Some recent data might be hidden   Zamirah Denny, MSW, LCSWA [phone] Heart Failure Social Worker

## 2020-08-16 NOTE — TOC Progression Note (Signed)
Transition of Care (TOC) - Progression Note  Heart Failure   Patient Details  Name: Kelly [name] MRN: [id number] Date of Birth: 01/01/1986  Transition of Care [facility]) CM/SW [place], [place] Phone Number: 08/16/2020, 11:09 AM  Clinical Narrative:    CSW spoke with Ms. Reinheimer and followed up with her about her expired drivers license and if she has called the DMV to ask about her license and why it can be renewed online. Ms. Deberry called the [facility] and she was told to request for a duplicate instead of for a renewal or her drivers license. CSW did this on the [facility] website and still received an error message and to call DMV support at [phone]. Both CSW and patient have made attempts to contact the [name] and have been on hold for several minutes. Ms. Merk will need a valid drivers license in order to receive methadone at the clinic they will not accept an expired license. Ms. Strycharz is aware that she needs to work on getting a valid drivers license so that she can receive methadone at a clinic upon discharge.   CSW tried calling Ms. Rosiland Oz with [facility] to see if Ms. Bodenheimer has been accepted into the program however CSW had leave a voicemail and is awaiting a call back regarding Ms. Scalisi admission into the [employer].      Barriers to Discharge: Continued Medical Work [employer] Resources with medical needs,Unsafe home situation,Financial Resources,Active Substance Use - Placement  Expected Discharge Plan and Services   In-house Referral: Clinical Social Work Discharge Planning Services: CM Consult                                           Social Determinants of Health (SDOH) Interventions Food Insecurity Interventions: Other (Comment) (Pt. reports having food stamps) Financial Strain Interventions: Intervention Not Indicated,Other (Comment) (CSW working on connecting Pt. to resources) Housing Interventions: Other (Comment) (Referral to  outpatient residential treatment program for pregnant women) Transportation Interventions: [facility] Alcohol Brief Interventions/Follow-up: Continued Monitoring (Pt. reports that she hasn't had a drink since her pregnancy) Depression Interventions/Treatment : Counseling,Medication (Pt. reports she has been on medication before and needs to get back on medication and she is open to counseling and meds.)  Readmission Risk Interventions Readmission Risk Prevention Plan 07/30/2018  Transportation Screening Complete  Medication Review Press photographer) Complete  PCP or Specialist appointment within 3-5 days of discharge Complete  HRI or [place] Not Complete  HRI or Home Care Consult Pt Refusal Comments N/A  SW Recovery Care/Counseling Consult Complete  Palliative Care Screening Not Complete  Comments N/A  [place] Not Complete  SNF Comments N/A  Some recent data might be hidden   Deanne Bedgood, MSW, LCSWA [phone] Heart Failure Social Worker

## 2020-08-16 NOTE — TOC Progression Note (Addendum)
Transition of Care (TOC) - Progression Note  Heart Failure   Patient Details  Name: Kelly [name] MRN: [id number] Date of Birth: 1985-08-11  Transition of Care [facility]) CM/SW [place], [place] Phone Number: 08/16/2020, 1:26 PM  Clinical Narrative:    CSW was finally able to get a duplicate Drivers License/ID ordered online through the [facility] for the patient. CSW emailed the temporary identification card to the patient and brought a hard copy to her room. The Driver's License will be mailed to the Rooms at the [place] and [name] and CSW made them aware of this.   CSW reached out to [facility] [phone] and spoke with Langley Gauss to move [name] methadone appointment to 08/26/2020 instead of 08/31/2020 and Langley Gauss reported she has [name] already scheduled for 08/26/2020 at [place] informed Ms. Malter and [employer] with Rooms at [facility] and made them both aware of the appointment at the methadone clinic on 08/26/2020 at [place] will continue to follow for d/c needs.      Barriers to Discharge: Continued Medical Work WPS Resources with medical needs,Unsafe home situation,Financial Resources,Active Substance Use - Placement  Expected Discharge Plan and Services   In-house Referral: Clinical Social Work Discharge Planning Services: CM Consult                                           Social Determinants of Health (SDOH) Interventions Food Insecurity Interventions: Other (Comment) (Pt. reports having food stamps) Financial Strain Interventions: Intervention Not Indicated,Other (Comment) (CSW working on connecting Pt. to resources) Housing Interventions: Other (Comment) (Referral to outpatient residential treatment program for pregnant women) Transportation Interventions: [facility] Alcohol Brief Interventions/Follow-up: Continued Monitoring (Pt. reports that she hasn't had a drink since her pregnancy) Depression  Interventions/Treatment : Counseling,Medication (Pt. reports she has been on medication before and needs to get back on medication and she is open to counseling and meds.)  Readmission Risk Interventions Readmission Risk Prevention Plan 07/30/2018  Transportation Screening Complete  Medication Review Press photographer) Complete  PCP or Specialist appointment within 3-5 days of discharge Complete  HRI or [place] Not Complete  HRI or Home Care Consult Pt Refusal Comments N/A  SW Recovery Care/Counseling Consult Complete  Palliative Care Screening Not Complete  Comments N/A  [place] Not Complete  SNF Comments N/A  Some recent data might be hidden   Jahmani Staup, MSW, LCSWA [phone] Heart Failure Social Worker

## 2020-08-17 ENCOUNTER — Inpatient Hospital Stay (HOSPITAL_BASED_OUTPATIENT_CLINIC_OR_DEPARTMENT_OTHER): Payer: Medicaid Other

## 2020-08-17 DIAGNOSIS — R7881 Bacteremia: Secondary | ICD-10-CM | POA: Diagnosis not present

## 2020-08-17 DIAGNOSIS — Z3A31 31 weeks gestation of pregnancy: Secondary | ICD-10-CM

## 2020-08-17 DIAGNOSIS — Z98891 History of uterine scar from previous surgery: Secondary | ICD-10-CM | POA: Diagnosis not present

## 2020-08-17 DIAGNOSIS — I5023 Acute on chronic systolic (congestive) heart failure: Secondary | ICD-10-CM | POA: Diagnosis not present

## 2020-08-17 DIAGNOSIS — O34219 Maternal care for unspecified type scar from previous cesarean delivery: Secondary | ICD-10-CM

## 2020-08-17 DIAGNOSIS — I38 Endocarditis, valve unspecified: Secondary | ICD-10-CM

## 2020-08-17 DIAGNOSIS — O99323 Drug use complicating pregnancy, third trimester: Secondary | ICD-10-CM | POA: Diagnosis not present

## 2020-08-17 DIAGNOSIS — O10013 Pre-existing essential hypertension complicating pregnancy, third trimester: Secondary | ICD-10-CM | POA: Diagnosis not present

## 2020-08-17 DIAGNOSIS — O99413 Diseases of the circulatory system complicating pregnancy, third trimester: Secondary | ICD-10-CM | POA: Diagnosis not present

## 2020-08-17 DIAGNOSIS — I330 Acute and subacute infective endocarditis: Secondary | ICD-10-CM | POA: Diagnosis not present

## 2020-08-17 DIAGNOSIS — O09523 Supervision of elderly multigravida, third trimester: Secondary | ICD-10-CM

## 2020-08-17 LAB — CBC
HCT: 26.9 % — ABNORMAL LOW (ref 36.0–46.0)
Hemoglobin: 8.8 g/dL — ABNORMAL LOW (ref 12.0–15.0)
MCH: 30.4 pg (ref 26.0–34.0)
MCHC: 32.7 g/dL (ref 30.0–36.0)
MCV: 93.1 fL (ref 80.0–100.0)
Platelets: 269 10*3/uL (ref 150–400)
RBC: 2.89 MIL/uL — ABNORMAL LOW (ref 3.87–5.11)
RDW: 16.4 % — ABNORMAL HIGH (ref 11.5–15.5)
WBC: 6.5 10*3/uL (ref 4.0–10.5)
nRBC: 0 % (ref 0.0–0.2)

## 2020-08-17 LAB — BASIC METABOLIC PANEL
Anion gap: 6 (ref 5–15)
BUN: 14 mg/dL (ref 6–20)
CO2: 27 mmol/L (ref 22–32)
Calcium: 9.1 mg/dL (ref 8.9–10.3)
Chloride: 100 mmol/L (ref 98–111)
Creatinine, Ser: 0.56 mg/dL (ref 0.44–1.00)
GFR, Estimated: >60 mL/min (ref >60)
Glucose, Bld: 89 mg/dL (ref 70–99)
Potassium: 3.9 mmol/L (ref 3.5–5.1)
Sodium: 133 mmol/L — ABNORMAL LOW (ref 135–145)

## 2020-08-17 LAB — MAGNESIUM: Magnesium: 1.6 mg/dL — ABNORMAL LOW (ref 1.7–2.4)

## 2020-08-17 MED ORDER — MAGNESIUM SULFATE 4 GM/100ML IV SOLN
4.0000 g | Freq: Once | INTRAVENOUS | Status: AC
  Administered 2020-08-17: 4 g via INTRAVENOUS
  Filled 2020-08-17: qty 100

## 2020-08-17 NOTE — TOC Progression Note (Addendum)
Transition of Care (TOC) - Progression Note  Heart Failure   Patient Details  Name: Kelly [name] MRN: [id number] Date of Birth: Feb 15, 1986  Transition of Care [name]) CM/SW [place], [place] Phone Number: 08/17/2020, 1:13 PM  Clinical Narrative:    CSW received a call from Ms. Topping at 11:48am who stated she heard from Strang with Rooms at the [facility] who stated she didn't have the needed paperwork. CSW informed Ms. Hossain that information was faxed to Ms. Safriet on 08/10/20, 08/16/2020 and 08/17/2020 including sending Ms. Safriet an email encouraging her to reach out if needed.  CSW informed Ms. Rohner and Ms. Safriet with Rooms at the [place] that the [facility] is currently clinically full at this time but maybe by the time she gives birth they might have an opening for her and that [facility] will keep her on the list. CSW encouraged Ms. Dicicco to call and do a phone screen upon giving birth to see if they can accept her at [facility] at that time.  TOC will continue to follow for d/c needs.    Barriers to Discharge: Continued Medical Work WPS Resources with medical needs,Unsafe home situation,Financial Resources,Active Substance Use - Placement  Expected Discharge Plan and Services   In-house Referral: Clinical Social Work Discharge Planning Services: CM Consult                                           Social Determinants of Health (SDOH) Interventions Food Insecurity Interventions: Other (Comment) (Pt. reports having food stamps) Financial Strain Interventions: Intervention Not Indicated,Other (Comment) (CSW working on connecting Pt. to resources) Housing Interventions: Other (Comment) (Referral to outpatient residential treatment program for pregnant women) Transportation Interventions: [facility] Alcohol Brief Interventions/Follow-up: Continued Monitoring (Pt. reports that she hasn't had a drink since her  pregnancy) Depression Interventions/Treatment : Counseling,Medication (Pt. reports she has been on medication before and needs to get back on medication and she is open to counseling and meds.)  Readmission Risk Interventions Readmission Risk Prevention Plan 07/30/2018  Transportation Screening Complete  Medication Review Press photographer) Complete  PCP or Specialist appointment within 3-5 days of discharge Complete  HRI or [place] Not Complete  HRI or Home Care Consult Pt Refusal Comments N/A  SW Recovery Care/Counseling Consult Complete  Palliative Care Screening Not Complete  Comments N/A  [place] Not Complete  SNF Comments N/A  Some recent data might be hidden   Marshawn Ninneman, MSW, LCSWA [phone] Heart Failure Social Worker

## 2020-08-17 NOTE — Progress Notes (Signed)
Nutrition Follow-up  DOCUMENTATION CODES:   Not applicable  INTERVENTION:   Continue Ensure Enlive po BID, each supplement provides 350 kcal and 20 grams of protein.  Continue snacks between meals.   Encourage adequate PO intake.   Continue prenatal vitamin once daily.    NUTRITION DIAGNOSIS:   Increased nutrient needs related to chronic illness (heart failure) as evidenced by estimated needs.  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 Pt noted to be fluid overload in addition blood cultures were positive for MRSA. Pt found to be pregnant.   Pt is currently [redacted] weeks pregnant.  Plans for 6 weeks of IV antibiotics for MRSA sepsis, suspected recurrent endocarditis.  Patient to remain inpatient until IV antibiotics are completed 4/14  Remains on a regular diet. Ensure Enlive/Plus BID between meals as well as snacks TID between meals. Meal intakes documented at 100%.  Patient reports good appetite and good intake of meals. She is drinking Ensure supplements twice daily. She does not like the food, so she has been ordering a [place] sandwich every day for lunch and dinner. RD to adjust dinner meal today per patient request.  Labs and medications reviewed.   Weight up 2.4 lbs since last week.   Diet Order:   Diet  Order            Diet regular Room service appropriate? Yes; Fluid consistency: Thin  Diet effective now                 EDUCATION NEEDS:   Education needs have been addressed  Skin:  Skin Assessment: Reviewed RN Assessment  Last BM:  4/5  Height:   Ht Readings from Last 1 Encounters:  07/13/20 5' 7.5" (1.715 m)    Weight:   Wt Readings from Last 1 Encounters:  08/16/20 84.6 kg   BMI:  Body mass index is 28.78 kg/m.  Estimated Nutritional Needs:   Kcal:  2100-2300  Protein:  105-115 grams  Fluid:  >/= 2 L/day   Lucas Mallow, RD, LDN, CNSC Please refer to Amion for contact information.

## 2020-08-17 NOTE — Progress Notes (Addendum)
PROGRESS NOTE    Kelly [name]  PQZ:[id number] DOB: 05-16-1985 DOA: 07/13/2020 PCP: [facility]    Brief Narrative:  Inpatient consultation for MRSA bacteremia/endocarditis.  35 year old female past medical history for polysubstance abuse including heroin, and fentanyl.  She also has bipolar disease and history endocarditis with epidural abscess.  Currently she is pregnant and using intravenous drugs.  Reported right upper quadrant abdominal pain for about 2 days which was persistent and severe to the point where she was unable to ambulate.  At the time of consultation blood pressure 120/70, heart rate 111, respiratory rate 24, temperature 99.3, oxygenation 90%, she had diffuse rhonchi bilaterally, heart S1-S2, present, tachycardic, positive systolic murmur, abdomen gravid, no significant lower extremity edema.  Blood cultures were positive for methicillin resistant Staphylococcus 4-4 bottles.  Transthoracic echocardiography no signs of vegetation, transesophageal echocardiogram was deferred.  Considering her clinical scenario very high pretest for endocarditis.  She has been placed on empirical therapy with vancomycin plan to complete 4/14 2022.   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)   Tricuspid regurgitation   Supervision of other high risk pregnancies, third trimester   Preexisting hypertension complicating pregnancy, antepartum   Dental caries   Endocarditis   History of VBAC   Lumbar back pain   Unwanted fertility   Acute on chronic congestive heart failure (HCC)   Cellulitis of right lower extremity    Septic embolism (HCC)   Syphilis   [redacted] weeks gestation of pregnancy   Abdominal pain   Drug use affecting pregnancy in second trimester   Methadone maintenance treatment affecting pregnancy ([place])   Maternal endocarditis complicating pregnancy in third trimester   Pain, dental   1. MRSA bacteremia/ endocarditis/ Septic pulmonary embolism (p[place]onia). Patient is tolerating well antibiotic therapy with IV vancomycin, plan to complete on 04/14. No diarrhea, no nausea or vomiting.  Patient has been afebrile, follow up blood cultures from 03.04 with no growth.   2. Acute on chronic right sided heart failure due to severe TR, complicated with severe pulmonary hypertension. Improved volume status, continue diuresis with furosemide 40 mg daily. \  3. Anemia of chronic disease. Hgb has been stable, no current indication for PRBC transfusion.   4. IV drug abuse// back pain/ depression. No clinical signs of acute withdrawal, continue with methadone 120 mg daily.  As needed hydromorphone for break through pain control.   Continue with sertraline.   5. Hepatitis C/ syphilis. Sp penicillin 2,4 MU IM, continue follow up as outpatient.   6. Hyponatremia. Continue close follow up on renal function and electrolytes.   Status is: Inpatient  Remains inpatient appropriate because:IV treatments appropriate due to intensity of illness or inability to take PO   Dispo: The patient is from: Home              Anticipated d/c is to: Home              Patient currently is not medically stable to d/c.   Difficult to place patient No   DVT prophylaxis: Enoxaparin   Code Status:   full  Family Communication:  No family at the bedside  Nutrition Status: Nutrition Problem: Increased nutrient needs Etiology: chronic illness (heart failure) Signs/Symptoms: estimated needs Interventions: Ensure Enlive (each supplement provides 350kcal and 20 grams of protein)      Consultants:   ID  Antibiotic  therapy: vancomycin    Subjective: Patient is feeling better, but continue to have back pain, improved with analgesics, no nausea or vomiting. No chest pain or dyspnea.   Objective: Vitals:   08/16/20 1823 08/16/20 2314 08/17/20 0537 08/17/20 1230  BP: 96/64 94/68 106/71 (!) 112/55  Pulse: 61 67 68 87  Resp: 16 20 18 16   Temp: 98.4 F (36.9 C) 97.8 F (36.6 C) 98.4 F (36.9 C) (!) 97.5 F (36.4 C)  TempSrc: Oral Oral Oral Oral  SpO2: 96% 96% 97% 95%  Weight:      Height:        Intake/Output Summary (Last 24 hours) at 08/17/2020 1457 Last data filed at 08/16/2020 1823 Gross per 24 hour  Intake 490 ml  Output --  Net 490 ml   Filed Weights   08/13/20 0438 08/15/20 0450 08/16/20 0534  Weight: 84.5 kg 84.9 kg 84.6 kg    Examination:   General: Not in pain or dyspnea, deconditioned  Neurology: Awake and alert, non focal  E ENT: mild pallor, no icterus, oral mucosa moist Cardiovascular: No JVD. S1-S2 present, rhythmic, no gallops, rubs, or murmurs. No lower extremity edema. Pulmonary: positive breath sounds bilaterally,  Gastrointestinal. Abdomen soft and non tender. Gravid/  Skin. No rashes Musculoskeletal: no joint deformities     Data Reviewed: I have personally reviewed following labs and imaging studies  CBC: Recent Labs  Lab 08/12/20 0336 08/13/20 0258 08/15/20 0411 08/17/20 0407  WBC 6.5  --  6.8 6.5  HGB 8.1* 8.3* 8.9* 8.8*  HCT 24.9* 26.1* 27.7* 26.9*  MCV 93.3  --  92.6 93.1  PLT 272  --  295 818   Basic Metabolic Panel: Recent Labs  Lab 08/12/20 0336 08/13/20 0258 08/15/20 0411 08/17/20 0407  NA 133* 131* 132* 133*  K 3.5 3.9 3.8 3.9  CL 102 101 98 100  CO2 25 24 26 27   GLUCOSE 89 88 91 89  BUN 11 15 16 14   CREATININE 0.52 0.56 0.59 0.56  CALCIUM 9.0 8.7* 9.1 9.1  MG 1.6*  --   --  1.6*  PHOS 4.1  --   --   --    GFR: Estimated Creatinine Clearance: 110.8 mL/min (by C-G formula based on SCr of 0.56 mg/dL). Liver Function Tests: No  results for input(s): AST, ALT, ALKPHOS, BILITOT, PROT, ALBUMIN in the last 168 hours. No results for input(s): LIPASE, AMYLASE in the last 168 hours. No results for input(s): AMMONIA in the last 168 hours. Coagulation Profile: No results for input(s): INR, PROTIME in the last 168 hours. Cardiac Enzymes: No results for input(s): CKTOTAL, CKMB, CKMBINDEX, TROPONINI in the last 168 hours. BNP (last 3 results) No results for input(s): PROBNP in the last 8760 hours. HbA1C: No results for input(s): HGBA1C in the last 72 hours. CBG: No results for input(s): GLUCAP in the last 168 hours. Lipid Profile: No results for input(s): CHOL, HDL, LDLCALC, TRIG, CHOLHDL, LDLDIRECT in the last 72 hours. Thyroid Function Tests: No results for input(s): TSH, T4TOTAL, FREET4, T3FREE, THYROIDAB in the last 72 hours. Anemia Panel: No results for input(s): VITAMINB12, FOLATE, FERRITIN, TIBC, IRON, RETICCTPCT in the last 72 hours.    Radiology Studies: I have reviewed all of the imaging during this hospital  visit personally     Scheduled Meds: . sodium chloride   Intravenous Once  . acetaminophen  650 mg Oral Q6H  . Chlorhexidine Gluconate Cloth  6 each Topical Daily  . doxylamine (Sleep)  12.5 mg Oral BID   And  . vitamin B-6  12.5 mg Oral BID  . enoxaparin (LOVENOX) injection  40 mg Subcutaneous Q24H  . feeding supplement  237 mL Oral BID BM  . furosemide  40 mg Oral Daily  . methadone  120 mg Oral Daily  . polyethylene glycol  17 g Oral BID  . prenatal vitamin w/FE, FA  1 tablet Oral Q1200  . senna-docusate  1 tablet Oral BID  . sertraline  50 mg Oral Daily  . sodium chloride flush  10-40 mL Intracatheter Q12H   Continuous Infusions: . magnesium sulfate bolus IVPB 4 g (08/17/20 1433)  . vancomycin 750 mg (08/17/20 1228)     LOS: 35 days        Tristain Daily Gerome Apley, MD

## 2020-08-17 NOTE — Progress Notes (Signed)
Monitors applied for daily NST.  Pt is sleeping on left side.  No complaints at this time.  Good fetal movement audible via EFM.

## 2020-08-18 ENCOUNTER — Encounter (HOSPITAL_COMMUNITY): Payer: Self-pay | Admitting: Pulmonary Disease

## 2020-08-18 DIAGNOSIS — Z3A31 31 weeks gestation of pregnancy: Secondary | ICD-10-CM

## 2020-08-18 DIAGNOSIS — I339 Acute and subacute endocarditis, unspecified: Secondary | ICD-10-CM

## 2020-08-18 DIAGNOSIS — F1990 Other psychoactive substance use, unspecified, uncomplicated: Secondary | ICD-10-CM | POA: Diagnosis not present

## 2020-08-18 DIAGNOSIS — R7881 Bacteremia: Secondary | ICD-10-CM | POA: Diagnosis not present

## 2020-08-18 DIAGNOSIS — I5023 Acute on chronic systolic (congestive) heart failure: Secondary | ICD-10-CM | POA: Diagnosis not present

## 2020-08-18 DIAGNOSIS — I330 Acute and subacute infective endocarditis: Secondary | ICD-10-CM | POA: Diagnosis not present

## 2020-08-18 LAB — VANCOMYCIN, TROUGH: Vancomycin Tr: 13 ug/mL — ABNORMAL LOW (ref 15–20)

## 2020-08-18 LAB — MAGNESIUM: Magnesium: 1.8 mg/dL (ref 1.7–2.4)

## 2020-08-18 MED ORDER — MAGNESIUM OXIDE 400 (241.3 MG) MG PO TABS: 400.0000 mg | ORAL_TABLET | Freq: Every day | ORAL | Status: DC

## 2020-08-18 MED ORDER — HYDROMORPHONE HCL 1 MG/ML IJ SOLN
0.5000 mg | Freq: Four times a day (QID) | INTRAMUSCULAR | Status: DC | PRN
Start: 2020-08-18 — End: 2020-08-20
  Administered 2020-08-18 – 2020-08-19 (×3): 0.5 mg via INTRAVENOUS
  Filled 2020-08-18 (×5): qty 1

## 2020-08-18 MED ORDER — MAGNESIUM SULFATE 4 GM/100ML IV SOLN
4.0000 g | Freq: Once | INTRAVENOUS | Status: AC
  Administered 2020-08-18: 4 g via INTRAVENOUS
  Filled 2020-08-18: qty 100

## 2020-08-18 NOTE — Progress Notes (Signed)
Pharmacy Antibiotic Note  Kelly [name] is a 35 y.o. female with MRSA bacteremia  And TV endocarditis. 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  Pharmacy has been consulted for vancomycin. Patient is pregnant with EGA of [redacted] weeks.  Scr remains stable at 0.56 (CrCl >100 mL/min). Remains afebrile. Planning 6 weeks of inpatient IV antibiotics. Patient clinically improving. Most recent vancomycin levels 13 a little on the lower side but AUC has been at goal 400s with this same trough and renal function.   Wbc wnl, afebrile   Plan: Continue vancomycin to 750 mg IV q 8 hrs Monitor C&S, clinical status, renal function, VT as needed Plan for levels as needed    Temp (24hrs), Avg:97.8 F (36.6 C), Min:97.6 F (36.4 C), Max:97.9 F (36.6 C)  Recent Labs  Lab 08/12/20 0336 08/13/20 0258 08/15/20 0411 08/17/20 0407 08/18/20 1002  WBC 6.5  --  6.8 6.5  --   CREATININE 0.52 0.56 0.59 0.56  --   VANCOTROUGH  --   --   --   --  13*    Estimated Creatinine Clearance: 110.8 mL/min (by C-G formula based on SCr of 0.56 mg/dL).    Allergies  Allergen Reactions  . Hydrocodone Itching  . Morphine And Related Nausea And Vomiting    Antimicrobials this admission: Azith 3/2 x1 CTX 3/2 x1 Vancomycin 3/2> (4/14) proposed stop date  Cefepime 3/2> 3/3  Cultures: 3/2 urine - ng 3/2 Bld - GPC - MRSA 3/2 MRSA PCR pos 3/4 BCx x 2 > Neg    Bonnita Nasuti Pharm.D. CPP, BCPS Clinical Pharmacist [phone] 08/18/2020 3:21 PM

## 2020-08-18 NOTE — Progress Notes (Signed)
Patient c/o lower medial abdominal pain starting this morning about 10am.  No leaking or bleeding noted.  Pain 5/10.  Monitors applied for NST.

## 2020-08-18 NOTE — Progress Notes (Signed)
FACULTY PRACTICE ANTEPARTUM(COMPREHENSIVE) NOTE  Kelly [name] is a 35 y.o. [phone] with Estimated Date of Delivery: 10/18/20   By  Late 2nd trimester [place] at  [redacted]w[redacted]d who is admitted for MRSA bacteremia, endocarditis with severe tricuspid regurgitation, septic emboli with h/o discitis/epidural abscess, chronic HTN and acute on chronic right sided heart failure in the setting of IV heroin and fentanyl abuse  Fetal presentation is cephalic. Length of Stay:  36  Days  Date of admission:07/13/2020  Subjective:  Patient reports the fetal movement as active. Patient reports uterine contraction  activity as notes some irregular lower pelvic pain- no contractions noted on monitoring today. Patient reports  vaginal bleeding as none. Patient describes fluid per vagina as None.  Vitals:  Blood pressure 120/78, pulse 95, temperature 97.6 F (36.4 C), temperature source Oral, resp. rate 19, height 5' 7.5" (1.715 m), weight 84.6 kg, SpO2 95 %, not currently breastfeeding. Vitals:   08/17/20 1823 08/17/20 2331 08/18/20 0521 08/18/20 1226  BP: 105/65 95/67 93/64  120/78  Pulse: 70 73 70 95  Resp: 16 18 20 19   Temp: 97.8 F (36.6 C) 97.9 F (36.6 C) 97.8 F (36.6 C) 97.6 F (36.4 C)  TempSrc: Oral Oral Oral Oral  SpO2: 95% 96% 95% 95%  Weight:      Height:       Physical Examination:  General appearance - drowsy this afternoon, no acute distress Mental status - normal mood and behavior Chest - clear to auscultation bilaterally Heart - normal rate and regular rhythm Abdomen - gravid, soft and non-tender Extremities - no pedal edema noted, no calf tenderness bilaterally Skin - warm and dry   Fetal Monitoring:  Baseline: 140 bpm, Variability: moderate, Accelerations: +accels, and Decelerations: Absent    reactive  Labs:  Results for orders placed or performed during the hospital encounter of 07/13/20 (from the past 24 hour(s))  Magnesium   Collection Time: 08/18/20  4:33 AM  Result Value  Ref Range   Magnesium 1.8 1.7 - 2.4 mg/dL  Vancomycin, trough   Collection Time: 08/18/20 10:02 AM  Result Value Ref Range   Vancomycin Tr 13 (L) 15 - 20 ug/mL    Imaging Studies:    [place]FM FETAL BPP WO NON STRESS  Result Date: 08/17/2020 ----------------------------------------------------------------------  OBSTETRICS REPORT                       (Signed Final 08/17/2020 03:58 pm) ---------------------------------------------------------------------- Patient Info  ID #:       [id number]                         D.O.B.:  009/01/1986(34 yrs)  Name:       Kelly [name]             Visit Date: 08/17/2020 01:05 pm ---------------------------------------------------------------------- Performed By  Attending:        VJohnell ComingsMD         Ref. Address:     [address] Performed By:     AHubert Azure  [facility]                    [place]  Referred By:      Sloan Leiter                    MD ---------------------------------------------------------------------- Orders  #  Description                           Code        Ordered By  1  [place] MFM FETAL BPP WO NON               76819.01    JULIA MARSALA     STRESS ----------------------------------------------------------------------  #  Order #                     Accession #                Episode #  1  [id number]                   [id number]                 [id number] ---------------------------------------------------------------------- Indications  Drug use complicating pregnancy, third         J85.631  trimester  Hypertension - Chronic/Pre-existing            O10.019  [redacted] weeks gestation of pregnancy                Z3A.31  Heart disease in mother affecting pregnancy    O99.413  in third trimester  Advanced maternal age multigravida 58+,        O35.523  third trimester  Previous cesarean delivery, antepartum x1       O34.219  Heart disease in mother affecting pregnancy    O99.413  in third trimester ---------------------------------------------------------------------- Fetal Evaluation  Num Of Fetuses:         1  Fetal Heart Rate(bpm):  135  Cardiac Activity:       Observed  Presentation:           Cephalic  Placenta:               Anterior  P. Cord Insertion:      Visualized, central  Amniotic Fluid  AFI FV:      Within normal limits  AFI Sum(cm)     %Tile       Largest Pocket(cm)  12.8            37          5.6  RUQ(cm)       RLQ(cm)       LUQ(cm)        LLQ(cm)  5.6           3.5           2.1            1.6 ---------------------------------------------------------------------- Biophysical Evaluation  Amniotic F.V:   Within normal limits       F. Tone:        Observed  F. Movement:    Observed  Score:          6/8  F. Breathing:   Not Observed ---------------------------------------------------------------------- OB History  Gravidity:    7         Term:   3        Prem:   0        SAB:   3  TOP:          0       Ectopic:  0        Living: 3 ---------------------------------------------------------------------- Gestational Age  Best:          31w 1d     Det. By:  U/S  (07/13/20)          EDD:   10/18/20 ---------------------------------------------------------------------- Comments  This patient has been hospitalized due to endocarditis.  A biophysical profile performed today was 6 out of 8.  She  received a -2 for fetal breathing movements that did not meet  criteria.  There was normal amniotic fluid noted on today's ultrasound  exam.  Due to the biophysical profile of 6 out of 8, she should have a  nonstress test performed today. ----------------------------------------------------------------------                   Johnell Comings, MD Electronically Signed Final Report   08/17/2020 03:58 pm ----------------------------------------------------------------------    Medications:  Scheduled . sodium chloride    Intravenous Once  . acetaminophen  650 mg Oral Q6H  . Chlorhexidine Gluconate Cloth  6 each Topical Daily  . enoxaparin (LOVENOX) injection  40 mg Subcutaneous Q24H  . feeding supplement  237 mL Oral BID BM  . furosemide  40 mg Oral Daily  . methadone  120 mg Oral Daily  . polyethylene glycol  17 g Oral BID  . prenatal vitamin w/FE, FA  1 tablet Oral Q1200  . senna-docusate  1 tablet Oral BID  . sertraline  50 mg Oral Daily  . sodium chloride flush  10-40 mL Intracatheter Q12H   I have reviewed the patient's current medications.  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 Plan to review with primary team -Appreciate involvement of transition of care time as we work to coordinate care as an outpatient -OB follow up appointments scheduled -Plan for TOLAC and BTL to be completed around 37wks  JJanyth Pupa DO Attending OJeffers FK[facility]for [employer]De[employer] CDunn

## 2020-08-18 NOTE — Progress Notes (Signed)
PROGRESS NOTE    Kelly [name]  JEH:[id number] DOB: 05/20/1985 DOA: 07/13/2020 PCP: [facility]    Brief Narrative:  Inpatient consultation for MRSA bacteremia/endocarditis.  35 year old female past medical history for polysubstance abuse including heroin, and fentanyl.  She also has bipolar disease and history endocarditis with epidural abscess.  Currently she is pregnant and using intravenous drugs.  Reported right upper quadrant abdominal pain for about 2 days which was persistent and severe to the point where she was unable to ambulate.  At the time of consultation blood pressure 120/70, heart rate 111, respiratory rate 24, temperature 99.3, oxygenation 90%, she had diffuse rhonchi bilaterally, heart S1-S2, present, tachycardic, positive systolic murmur, abdomen gravid, no significant lower extremity edema.  Blood cultures were positive for methicillin resistant Staphylococcus 4-4 bottles.  Transthoracic echocardiography no signs of vegetation, transesophageal echocardiogram was deferred.  Considering her clinical scenario very high pretest for endocarditis.  She has been placed on empirical therapy with vancomycin plan to complete 4/14 2022.   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)   Tricuspid regurgitation   Supervision of other high risk pregnancies, third trimester   Preexisting hypertension complicating pregnancy, antepartum   Dental caries   Endocarditis   History of VBAC   Lumbar back pain   Unwanted fertility   Acute on chronic congestive heart failure (HCC)   Cellulitis of right lower  extremity   Septic embolism (HCC)   Syphilis   [redacted] weeks gestation of pregnancy   Abdominal pain   Drug use affecting pregnancy in second trimester   Methadone maintenance treatment affecting pregnancy ([place])   Maternal endocarditis complicating pregnancy in third trimester   Pain, dental    1. MRSA bacteremia/ endocarditis/ Septic pulmonary embolism (peneumonia). Continue with antibiotic therapy with IV vancomycin, plan to complete on 04/14.  2. Acute on chronic right sided heart failure due to severe TR, complicated with severe pulmonary hypertension. On diuresis with furosemide 40 mg daily.   3. Anemia of chronic disease. Stable cell counts  4. IV drug abuse// back pain/ depression. Continue with methadone 120 mg daily.  Will decrease frequency and dosing of as needed hydromorphone for break through pain control.   On sertraline.   5. Hepatitis C/ syphilis. Sp penicillin 2,4 MU IM, continue follow up as outpatient.   6. Hyponatremia/ hypomagnesemia. Clinically resolved. Add oral mag oxide for Mg correction.   7. Pregnancy. Per primary team.   Status is: Inpatient  Remains inpatient appropriate because:IV treatments appropriate due to intensity of illness or inability to take PO   Dispo: The patient is from: Home              Anticipated d/c is to: Home              Patient currently is not medically stable to d/c.   Difficult to place patient No   DVT prophylaxis: Heparin   Code Status:   full  Family Communication:  No family at the bedside      Nutrition Status: Nutrition Problem: Increased nutrient needs Etiology: chronic illness (heart failure) Signs/Symptoms: estimated needs Interventions: Ensure Enlive (each supplement provides  350kcal and 20 grams of protein)       Antimicrobials:   Vancomycin     Subjective: Patient is tolerating po well, no nausea or vomiting, positive tooth ache, no chest pain or dyspnea.   Objective: Vitals:    08/17/20 1823 08/17/20 2331 08/18/20 0521 08/18/20 1226  BP: 105/65 95/67 93/64  120/78  Pulse: 70 73 70 95  Resp: 16 18 20 19   Temp: 97.8 F (36.6 C) 97.9 F (36.6 C) 97.8 F (36.6 C) 97.6 F (36.4 C)  TempSrc: Oral Oral Oral Oral  SpO2: 95% 96% 95% 95%  Weight:      Height:        Intake/Output Summary (Last 24 hours) at 08/18/2020 1454 Last data filed at 08/17/2020 1500 Gross per 24 hour  Intake 172.4 ml  Output --  Net 172.4 ml   Filed Weights   08/15/20 0450 08/16/20 0534  Weight: 84.9 kg 84.6 kg    Examination:   General: Not in pain or dyspnea,  Neurology: Awake and alert, non focal  E ENT: no pallor, no icterus, oral mucosa moist Cardiovascular: No JVD. S1-S2 present, rhythmic, no gallops, rubs, or murmurs. No lower extremity edema. Pulmonary: positive breath sounds bilaterally, adequate air movement, no wheezing, rhonchi or rales. Gastrointestinal. Abdomen soft and non tender Skin. No rashes Musculoskeletal: no joint deformities     Data Reviewed: I have personally reviewed following labs and imaging studies  CBC: Recent Labs  Lab 08/12/20 0336 08/13/20 0258 08/15/20 0411 08/17/20 0407  WBC 6.5  --  6.8 6.5  HGB 8.1* 8.3* 8.9* 8.8*  HCT 24.9* 26.1* 27.7* 26.9*  MCV 93.3  --  92.6 93.1  PLT 272  --  295 811   Basic Metabolic Panel: Recent Labs  Lab 08/12/20 0336 08/13/20 0258 08/15/20 0411 08/17/20 0407 08/18/20 0433  NA 133* 131* 132* 133*  --   K 3.5 3.9 3.8 3.9  --   CL 102 101 98 100  --   CO2 25 24 26 27   --   GLUCOSE 89 88 91 89  --   BUN 11 15 16 14   --   CREATININE 0.52 0.56 0.59 0.56  --   CALCIUM 9.0 8.7* 9.1 9.1  --   MG 1.6*  --   --  1.6* 1.8  PHOS 4.1  --   --   --   --    GFR: Estimated Creatinine Clearance: 110.8 mL/min (by C-G formula based on SCr of 0.56 mg/dL). Liver Function Tests: No results for input(s): AST, ALT, ALKPHOS, BILITOT, PROT, ALBUMIN in the last 168 hours. No results for input(s): LIPASE, AMYLASE in  the last 168 hours. No results for input(s): AMMONIA in the last 168 hours. Coagulation Profile: No results for input(s): INR, PROTIME in the last 168 hours. Cardiac Enzymes: No results for input(s): CKTOTAL, CKMB, CKMBINDEX, TROPONINI in the last 168 hours. BNP (last 3 results) No results for input(s): PROBNP in the last 8760 hours. HbA1C: No results for input(s): HGBA1C in the last 72 hours. CBG: No results for input(s): GLUCAP in the last 168 hours. Lipid Profile: No results for input(s): CHOL, HDL, LDLCALC, TRIG, CHOLHDL, LDLDIRECT in the last 72 hours. Thyroid Function Tests: No results for input(s): TSH, T4TOTAL, FREET4, T3FREE, THYROIDAB in the last 72 hours. Anemia Panel: No results for input(s): VITAMINB12, FOLATE, FERRITIN, TIBC, IRON, RETICCTPCT in the last 72 hours.    Radiology Studies: I have reviewed all of the imaging during this hospital  visit personally     Scheduled Meds: . sodium chloride   Intravenous Once  . acetaminophen  650 mg Oral Q6H  . Chlorhexidine Gluconate Cloth  6 each Topical Daily  . enoxaparin (LOVENOX) injection  40 mg Subcutaneous Q24H  . feeding supplement  237 mL Oral BID BM  . furosemide  40 mg Oral Daily  . methadone  120 mg Oral Daily  . polyethylene glycol  17 g Oral BID  . prenatal vitamin w/FE, FA  1 tablet Oral Q1200  . senna-docusate  1 tablet Oral BID  . sertraline  50 mg Oral Daily  . sodium chloride flush  10-40 mL Intracatheter Q12H   Continuous Infusions: . magnesium sulfate bolus IVPB    . vancomycin 750 mg (08/18/20 1204)     LOS: 36 days        Trinidad Ingle Gerome Apley, MD

## 2020-08-18 NOTE — Progress Notes (Signed)
NST completed.  No uterine activity seen on EFM or palpated.  Patient states she  "feels better while being monitored".  Encouragement given.  Talked about POC regarding Room at the [facility] and impending delivery of son.  Answered questions for patient.  In better spirits after NST.  Encouraged increased fluids and some walking in hallways for mental breaks.  Call [facility] with concerns or questions.  [phone].

## 2020-08-19 DIAGNOSIS — I5023 Acute on chronic systolic (congestive) heart failure: Secondary | ICD-10-CM | POA: Diagnosis not present

## 2020-08-19 DIAGNOSIS — I330 Acute and subacute infective endocarditis: Secondary | ICD-10-CM | POA: Diagnosis not present

## 2020-08-19 DIAGNOSIS — R7881 Bacteremia: Secondary | ICD-10-CM | POA: Diagnosis not present

## 2020-08-19 DIAGNOSIS — O10919 Unspecified pre-existing hypertension complicating pregnancy, unspecified trimester: Secondary | ICD-10-CM | POA: Diagnosis not present

## 2020-08-19 MED ORDER — VANCOMYCIN HCL 1000 MG IV SOLR
750.0000 mg | Freq: Three times a day (TID) | INTRAVENOUS | Status: DC
  Administered 2020-08-19 – 2020-08-25 (×18): 750 mg via INTRAVENOUS
  Filled 2020-08-19 (×20): qty 750

## 2020-08-19 MED ORDER — VANCOMYCIN HCL 10 G IV SOLR
750.0000 mg | Freq: Three times a day (TID) | INTRAVENOUS | Status: DC
  Filled 2020-08-19 (×3): qty 750

## 2020-08-19 NOTE — Progress Notes (Signed)
PROGRESS NOTE    Kelly [name]  GYI:[id number] DOB: 07/03/1985 DOA: 07/13/2020 [facility]CP: [facility]    Brief Narrative:  Inpatient consultation for MRSA bacteremia/endocarditis.  35 year old female past medical history for polysubstance abuse including heroin, and fentanyl. She also has bipolar disease and history endocarditis with epidural abscess. Currently she is pregnant and using intravenous drugs. Reported right upper quadrant abdominal pain for about 2 days which was persistent and severe to the point where she was unable to ambulate.  At the time of consultation blood pressure 120/70, heart rate 111, respiratory rate 24, temperature 99.3, oxygenation 90%, she had diffuse rhonchi bilaterally, heart S1-S2, present, tachycardic, positive systolic murmur, abdomen gravid, no significant lower extremity edema.  Blood cultures were positive for methicillin resistant Staphylococcus 4-4 bottles.  Transthoracic echocardiography no signs of vegetation, transesophageal echocardiogram was deferred. Considering her clinical scenario very high pretest for endocarditis.  She has been placed on empirical therapy with vancomycin plan to complete 4/14 2022.   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)   Tricuspid regurgitation   Supervision of other high risk pregnancies, third trimester   Preexisting hypertension complicating pregnancy, antepartum   Dental caries   Endocarditis   History of VBAC   Lumbar back pain   Unwanted fertility   Acute on chronic congestive heart failure (HCC)   Cellulitis of right lower  extremity   Septic embolism (HCC)   Syphilis   [redacted] weeks gestation of pregnancy   Abdominal pain   Drug use affecting pregnancy in second trimester   Methadone maintenance treatment affecting pregnancy ([place])   Maternal endocarditis complicating pregnancy in third trimester   Pain, dental     1. MRSA bacteremia/ endocarditis/ Septic pulmonary embolism (peneumonia). Antibiotic therapy with IV vancomycin, plan to complete on 04/14. Follow pharmacy protocol.   2. Acute on chronic right sided heart failure due to severe TR, complicated with severe pulmonary hypertension. Continue diuresis with furosemide 40 mg daily.  Will check renal panel in am to follow up on electrolytes.   3. Anemia of chronic disease. last hgb and hct on 04/06 8,8 and 26,9 respectively.   4. IV drug abuse// back pain/ depression. Tolerating well methadone 120 mg daily.  For break through using as needed hydromorphone   Continue with sertraline with good toleration.   5. Hepatitis C/ syphilis. Sp penicillin 2,4 MU IM, continue follow up as outpatient.   6. Hyponatremia/ hypomagnesemia. follow up renal function and electrolytes in am.  7. Pregnancy. Stable per primary team.    Status is: Inpatient  Remains inpatient appropriate because:IV treatments appropriate due to intensity of illness or inability to take PO   Dispo: The patient is from: Home              Anticipated d/c is to: Home              Patient currently is not medically stable to d/c.   Difficult to place patient No   DVT prophylaxis: Enoxaparin   Code Status:   full  Family Communication:  No family at the bedside      Nutrition Status: Nutrition Problem: Increased  nutrient needs Etiology: chronic illness (heart failure) Signs/Symptoms: estimated needs Interventions: Ensure Enlive (each supplement provides 350kcal and 20 grams of protein)       Antimicrobials:   IV vancomycin     Subjective: Patient is feeling  well, her pain is well controlled with current analgesic regimen, has been ambulating, no nausea or vomiting.   Objective: Vitals:   08/18/20 1226 08/18/20 1644 08/18/20 2313 08/19/20 0547  BP: 120/78 97/69 106/66 106/72  Pulse: 95 72 70 84  Resp: 19 18 19 18   Temp: 97.6 F (36.4 C) 98.8 F (37.1 C) 98.5 F (36.9 C) 97.8 F (36.6 C)  TempSrc: Oral Oral Oral Oral  SpO2: 95% 97% 97% 96%  Weight:    85 kg  Height:       No intake or output data in the 24 hours ending 08/19/20 1038 Filed Weights   08/16/20 0534 08/19/20 0547  Weight: 84.6 kg 85 kg    Examination:   General: Not in pain or dyspnea.  Neurology: Awake and alert, non focal  E ENT: no pallor, no icterus, oral mucosa moist Cardiovascular: No JVD. S1-S2 present, rhythmic, no gallops, rubs, or murmurs. No lower extremity edema. Pulmonary: positive breath sounds bilaterally, adequate air movement, no wheezing, rhonchi or rales. Gastrointestinal. Abdomen non tender Skin. No rashes Musculoskeletal: no joint deformities     Data Reviewed: I have personally reviewed following labs and imaging studies  CBC: Recent Labs  Lab 08/13/20 0258 08/15/20 0411 08/17/20 0407  WBC  --  6.8 6.5  HGB 8.3* 8.9* 8.8*  HCT 26.1* 27.7* 26.9*  MCV  --  92.6 93.1  PLT  --  295 272   Basic Metabolic Panel: Recent Labs  Lab 08/13/20 0258 08/15/20 0411 08/17/20 0407 08/18/20 0433  NA 131* 132* 133*  --   K 3.9 3.8 3.9  --   CL 101 98 100  --   CO2 24 26 27   --   GLUCOSE 88 91 89  --   BUN 15 16 14   --   CREATININE 0.56 0.59 0.56  --   CALCIUM 8.7* 9.1 9.1  --   MG  --   --  1.6* 1.8   GFR: Estimated Creatinine Clearance: 111.1 mL/min (by C-G formula based on SCr of 0.56 mg/dL). Liver Function Tests: No results for input(s): AST, ALT, ALKPHOS, BILITOT, PROT, ALBUMIN in the last 168 hours. No results for input(s): LIPASE, AMYLASE in the last 168 hours. No results for input(s): AMMONIA in the last 168  hours. Coagulation Profile: No results for input(s): INR, PROTIME in the last 168 hours. Cardiac Enzymes: No results for input(s): CKTOTAL, CKMB, CKMBINDEX, TROPONINI in the last 168 hours. BNP (last 3 results) No results for input(s): PROBNP in the last 8760 hours. HbA1C: No results for input(s): HGBA1C in the last 72 hours. CBG: No results for input(s): GLUCAP in the last 168 hours. Lipid Profile: No results for input(s): CHOL, HDL, LDLCALC, TRIG, CHOLHDL, LDLDIRECT in the last 72 hours. Thyroid Function Tests: No results for input(s): TSH, T4TOTAL, FREET4, T3FREE, THYROIDAB in the last 72 hours. Anemia Panel: No results for input(s): VITAMINB12, FOLATE, FERRITIN, TIBC, IRON, RETICCTPCT in the last 72 hours.    Radiology Studies: I have reviewed all of the imaging during this hospital visit personally     Scheduled Meds: . sodium chloride   Intravenous Once  . acetaminophen  650 mg Oral Q6H  . Chlorhexidine Gluconate Cloth  6 each Topical Daily  .  enoxaparin (LOVENOX) injection  40 mg Subcutaneous Q24H  . feeding supplement  237 mL Oral BID BM  . furosemide  40 mg Oral Daily  . methadone  120 mg Oral Daily  . polyethylene glycol  17 g Oral BID  . prenatal vitamin w/FE, FA  1 tablet Oral Q1200  . senna-docusate  1 tablet Oral BID  . sertraline  50 mg Oral Daily  . sodium chloride flush  10-40 mL Intracatheter Q12H   Continuous Infusions: . vancomycin 750 mg (08/19/20 0336)     LOS: 37 days        Simeon Vera Gerome Apley, MD

## 2020-08-20 DIAGNOSIS — I5023 Acute on chronic systolic (congestive) heart failure: Secondary | ICD-10-CM | POA: Diagnosis not present

## 2020-08-20 DIAGNOSIS — F1990 Other psychoactive substance use, unspecified, uncomplicated: Secondary | ICD-10-CM | POA: Diagnosis not present

## 2020-08-20 DIAGNOSIS — I330 Acute and subacute infective endocarditis: Secondary | ICD-10-CM | POA: Diagnosis not present

## 2020-08-20 DIAGNOSIS — R7881 Bacteremia: Secondary | ICD-10-CM | POA: Diagnosis not present

## 2020-08-20 LAB — BASIC METABOLIC PANEL
Anion gap: 7 (ref 5–15)
BUN: 18 mg/dL (ref 6–20)
CO2: 26 mmol/L (ref 22–32)
Calcium: 9.1 mg/dL (ref 8.9–10.3)
Chloride: 101 mmol/L (ref 98–111)
Creatinine, Ser: 0.57 mg/dL (ref 0.44–1.00)
GFR, Estimated: >60 mL/min (ref >60)
Glucose, Bld: 103 mg/dL — ABNORMAL HIGH (ref 70–99)
Potassium: 3.6 mmol/L (ref 3.5–5.1)
Sodium: 134 mmol/L — ABNORMAL LOW (ref 135–145)

## 2020-08-20 MED ORDER — POTASSIUM CHLORIDE CRYS ER 20 MEQ PO TBCR
40.0000 meq | EXTENDED_RELEASE_TABLET | Freq: Once | ORAL | Status: AC
  Administered 2020-08-20: 40 meq via ORAL
  Filled 2020-08-20: qty 2

## 2020-08-20 MED ORDER — OXYCODONE HCL 5 MG PO TABS
5.0000 mg | ORAL_TABLET | ORAL | Status: DC | PRN
  Administered 2020-08-21 – 2020-08-22 (×3): 5 mg via ORAL
  Filled 2020-08-20 (×4): qty 1

## 2020-08-20 MED ORDER — HYDROMORPHONE HCL 1 MG/ML IJ SOLN
0.5000 mg | Freq: Four times a day (QID) | INTRAMUSCULAR | Status: DC | PRN
  Administered 2020-08-22 – 2020-08-25 (×8): 0.5 mg via INTRAVENOUS
  Filled 2020-08-20 (×8): qty 1

## 2020-08-20 NOTE — Progress Notes (Signed)
PROGRESS NOTE    Kelly [name]  ZOX:[id number] DOB: 1986-04-22 DOA: 07/13/2020 PCP: [facility]    Brief Narrative:  Inpatient consultation for MRSA bacteremia/endocarditis.  35 year old female past medical history for polysubstance abuse including heroin, and fentanyl. She also has bipolar disease and history endocarditis with epidural abscess. Currently she is pregnant and using intravenous drugs. Reported right upper quadrant abdominal pain for about 2 days which was persistent and severe to the point where she was unable to ambulate.  At the time of consultation blood pressure 120/70, heart rate 111, respiratory rate 24, temperature 99.3, oxygenation 90%, she had diffuse rhonchi bilaterally, heart S1-S2, present, tachycardic, positive systolic murmur, abdomen gravid, no significant lower extremity edema.  Blood cultures were positive for methicillin resistant Staphylococcus 4-4 bottles.  Transthoracic echocardiography no signs of vegetation, transesophageal echocardiogram was deferred. Considering her clinical scenario very high pretest for endocarditis.  She has been placed on empirical therapy with vancomycin plan to complete 4/14 2022.  Patient continue methadone for pain control, has been on hydromorphone for break through pain control. Plan to slowly wean off hydromorphone in preparation for her discharge.    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)   Tricuspid regurgitation   Supervision of other high risk pregnancies, third trimester   Preexisting hypertension complicating pregnancy, antepartum    Dental caries   Endocarditis   History of VBAC   Lumbar back pain   Unwanted fertility   Acute on chronic congestive heart failure (HCC)   Cellulitis of right lower extremity   Septic embolism (HCC)   Syphilis   [redacted] weeks gestation of pregnancy   Abdominal pain   Drug use affecting pregnancy in second trimester   Methadone maintenance treatment affecting pregnancy ([place])   Maternal endocarditis complicating pregnancy in third trimester   Pain, dental    1. MRSA bacteremia/ endocarditis/ Septic pulmonary embolism (peneumonia).On IV vancomycin with good toleration. Schedule tp complete on 04/14 inpatient due to history of IV drug abuse.   Follow pharmacy protocol for Vancomycin dosing.   2. Acute on chronic right sided heart failure due to severe TR, complicated with severe pulmonary hypertension.Tolerating welldiuresis with furosemide 40 mg daily.  Keep K at 4 and Mg at 2.  3. Anemia of chronic disease.last hgb and hct on 04/06 8,8 and 26,9 respectively.  Outpatient follow up.   4. IV drug abuse// back pain/ depression/ right lower mandible teeth damage. Continue withmethadone 120 mg daily. Will continue hydromorphone as needed for severe pain and will add oxycodone for moderate pain.  Will need outpatient follow up with oral surgery.   On sertraline with good toleration.   5. Hepatitis C/ syphilis. Sp penicillin 2,4 MU IM, continue follow up as outpatient.   6. Hyponatremia/ hypomagnesemia.stable renal function with serum cr at 0,57 with Na at 134 and serum K at 3,6 with bicarbonate at 26.  Will add 40 meq Kcl today and then continue with 20 meq daily.   7. Pregnancy. Stable per primary team.   Status is: Inpatient  Remains inpatient appropriate because:IV treatments appropriate due to intensity of illness or inability to take PO  Dispo: The patient is from: Home              Anticipated d/c is to: Home              Patient currently is not medically  stable to d/c.   Difficult to place patient No  DVT prophylaxis: Enoxaparin   Code Status:   full  Family Communication:  No family at the bedside      Nutrition Status: Nutrition Problem: Increased nutrient needs Etiology: chronic illness (heart failure) Signs/Symptoms: estimated needs Interventions: Ensure Enlive (each supplement provides 350kcal and 20 grams of protein)    Consultation: ID   Antimicrobials:   IV vancomycin     Subjective: Patient feeling better today, her teeth pain is controlled with analgesics, no nausea or vomiting, no chest pain or dyspnea, no back pain,   Objective: Vitals:   08/19/20 1131 08/19/20 1634 08/19/20 2343 08/20/20 0554  BP: 108/76 (!) 101/56 97/67 104/64  Pulse: 89 67 65 68  Resp: 18 18 18 18   Temp: 97.9 F (36.6 C) 98.6 F (37 C) 97.9 F (36.6 C) 97.7 F (36.5 C)  TempSrc: Oral Oral Oral Oral  SpO2: 96% 97% 97% 96%  Weight:    86.3 kg  Height:       No intake or output data in the 24 hours ending 08/20/20 0846 Filed Weights   08/19/20 0547 08/20/20 0554  Weight: 85 kg 86.3 kg    Examination:   General: Not in pain or dyspnea, deconditioned  Neurology: Awake and alert, non focal  E ENT: no pallor, no icterus, oral mucosa moist Cardiovascular: No JVD. S1-S2 present, rhythmic, no gallops, rubs, or murmurs. No lower extremity edema. Pulmonary: positive breath sounds bilaterally, adequate air movement, no wheezing, rhonchi or rales. Gastrointestinal. Abdomen soft and non tender (gravid) Skin. No rashes Musculoskeletal: no joint deformities     Data Reviewed: I have personally reviewed following labs and imaging studies  CBC: Recent Labs  Lab 08/15/20 0411 08/17/20 0407  WBC 6.8 6.5  HGB 8.9* 8.8*  HCT 27.7* 26.9*  MCV 92.6 93.1  PLT 295 626   Basic Metabolic Panel: Recent Labs  Lab 08/15/20 0411 08/17/20 0407 08/18/20 0433 08/20/20 0327  NA 132* 133*  --  134*  K 3.8 3.9  --  3.6  CL 98 100  --  101   CO2 26 27  --  26  GLUCOSE 91 89  --  103*  BUN 16 14  --  18  CREATININE 0.59 0.56  --  0.57  CALCIUM 9.1 9.1  --  9.1  MG  --  1.6* 1.8  --    GFR: Estimated Creatinine Clearance: 111.9 mL/min (by C-G formula based on SCr of 0.57 mg/dL). Liver Function Tests: No results for input(s): AST, ALT, ALKPHOS, BILITOT, PROT, ALBUMIN in the last 168 hours. No results for input(s): LIPASE, AMYLASE in the last 168 hours. No results for input(s): AMMONIA in the last 168 hours. Coagulation Profile: No results for input(s): INR, PROTIME in the last 168 hours. Cardiac Enzymes: No results for input(s): CKTOTAL, CKMB, CKMBINDEX, TROPONINI in the last 168 hours. BNP (last 3 results) No results for input(s): PROBNP in the last 8760 hours. HbA1C: No results for input(s): HGBA1C in the last 72 hours. CBG: No results for input(s): GLUCAP in the last 168 hours. Lipid Profile: No results for input(s): CHOL, HDL, LDLCALC, TRIG, CHOLHDL, LDLDIRECT in the last 72 hours. Thyroid Function Tests: No results  for input(s): TSH, T4TOTAL, FREET4, T3FREE, THYROIDAB in the last 72 hours. Anemia Panel: No results for input(s): VITAMINB12, FOLATE, FERRITIN, TIBC, IRON, RETICCTPCT in the last 72 hours.    Radiology Studies: I have reviewed all of the imaging during this hospital visit personally     Scheduled Meds: . sodium chloride   Intravenous Once  . acetaminophen  650 mg Oral Q6H  . Chlorhexidine Gluconate Cloth  6 each Topical Daily  . enoxaparin (LOVENOX) injection  40 mg Subcutaneous Q24H  . feeding supplement  237 mL Oral BID BM  . furosemide  40 mg Oral Daily  . methadone  120 mg Oral Daily  . polyethylene glycol  17 g Oral BID  . prenatal vitamin w/FE, FA  1 tablet Oral Q1200  . senna-docusate  1 tablet Oral BID  . sertraline  50 mg Oral Daily  . sodium chloride flush  10-40 mL Intracatheter Q12H   Continuous Infusions: . vancomycin (VANCOCIN) 750 mg/257m IVPB 750 mg (08/20/20 0620)      LOS: 38 days        Verlia Kaney DGerome Apley MD

## 2020-08-21 DIAGNOSIS — I5023 Acute on chronic systolic (congestive) heart failure: Secondary | ICD-10-CM | POA: Diagnosis not present

## 2020-08-21 DIAGNOSIS — O10919 Unspecified pre-existing hypertension complicating pregnancy, unspecified trimester: Secondary | ICD-10-CM | POA: Diagnosis not present

## 2020-08-21 DIAGNOSIS — I330 Acute and subacute infective endocarditis: Secondary | ICD-10-CM | POA: Diagnosis not present

## 2020-08-21 DIAGNOSIS — R7881 Bacteremia: Secondary | ICD-10-CM | POA: Diagnosis not present

## 2020-08-21 MED ORDER — POTASSIUM CHLORIDE CRYS ER 20 MEQ PO TBCR
20.0000 meq | EXTENDED_RELEASE_TABLET | Freq: Every day | ORAL | Status: DC
  Administered 2020-08-21 – 2020-08-25 (×5): 20 meq via ORAL
  Filled 2020-08-21 (×5): qty 1

## 2020-08-21 NOTE — Progress Notes (Signed)
PROGRESS NOTE    Kelly [name]  UTM:[id number] DOB: [birth year] DOA: 07/13/2020 PCP: [facility]    Brief Narrative:  Inpatient consultation for MRSA bacteremia/endocarditis.  35 year old female past medical history for polysubstance abuse including heroin, and fentanyl. She also has bipolar disease and history endocarditis with epidural abscess. Currently she is pregnant and using intravenous drugs. Reported right upper quadrant abdominal pain for about 2 days which was persistent and severe to the point where she was unable to ambulate.  At the time of consultation blood pressure 120/70, heart rate 111, respiratory rate 24, temperature 99.3, oxygenation 90%, she had diffuse rhonchi bilaterally, heart S1-S2, present, tachycardic, positive systolic murmur, abdomen gravid, no significant lower extremity edema.  Blood cultures were positive for methicillin resistant Staphylococcus 4-4 bottles.  Transthoracic echocardiography no signs of vegetation, transesophageal echocardiogram was deferred. Considering her clinical scenario very high pretest for endocarditis.  She has been placed on empirical therapy with vancomycin plan to complete 4/14 2022.  Kelly [name] continue methadone for pain control, has been on hydromorphone for break through pain control. 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)   Tricuspid regurgitation   Supervision of other high risk pregnancies, third trimester   Preexisting hypertension complicating pregnancy, antepartum    Dental caries   Endocarditis   History of VBAC   Lumbar back pain   Unwanted fertility   Acute on chronic congestive heart failure (HCC)   Cellulitis of right lower extremity   Septic embolism (HCC)   Syphilis   [redacted] weeks gestation of pregnancy   Abdominal pain   Drug use affecting pregnancy in second trimester   Methadone maintenance treatment affecting pregnancy ([place])   Maternal endocarditis complicating pregnancy in third trimester   Pain, dental    1. MRSA bacteremia/ endocarditis/ Septic pulmonary embolism (peneumonia).Tolerating well IV vancomycin per pharmacy protocol.  Plan to complete on 04/14 inpatient due to history of IV drug abuse.   2. Acute on chronic right sided heart failure due to severe TR, complicated with severe pulmonary hypertension. No clinical sings of heart failure decompensation.  Continue diuresis with furosemide 40 mg daily, will add 20 meq Kcl diuretic induced hypokalemia.   3. Anemia of chronic disease.last hgb and hct on 04/06 8,8 and 26,9 respectively.Follow up per primary team, likely as outpatient.   4. IV drug abuse// back pain/ depression/ right lower mandible teeth damage. Onmethadone 120 mg daily with good toleration.   Patent has not required analgesics over the last 24 hrs  Plan for outpatient follow up with oral surgery.   Continue with sertraline.  5. Hepatitis C/ syphilis. Sp penicillin 2,4 MU IM, continue follow up as outpatient.   6. Hyponatremia/ hypomagnesemia/ hypokalemia. Continue with daily Kcl for potassium correction. Follow up renal function as outpatient.   7. Pregnancy.Stable per primary team.   Status is: Inpatient  Remains inpatient appropriate because:IV treatments appropriate due to intensity of illness or inability to take PO   Dispo: The Kelly [name] is from: Home              Anticipated  d/c is to: Home              Kelly [name] currently is not medically stable to d/c.   Difficult to place Kelly [name]  No   DVT prophylaxis: Enoxaparin   Code Status:   full  Family Communication:  No family at the bedside     Nutrition Status: Nutrition Problem: Increased nutrient needs Etiology: chronic illness (heart failure) Signs/Symptoms: estimated needs Interventions: Ensure Enlive (each supplement provides 350kcal and 20 grams of protein)     Subjective: Kelly [name] is feeling better, no nausea or vomiting, her tooth pain is controlled, no chest pain or dyspnea.   Objective: Vitals:   08/20/20 1706 08/21/20 0050 08/21/20 0517 08/21/20 1128  BP: 103/68 97/62 98/69  114/76  Pulse: 78 68 65 80  Resp: 17 18 18 19   Temp: 98.6 F (37 C) 98.2 F (36.8 C) 98.5 F (36.9 C) 98 F (36.7 C)  TempSrc: Oral Oral Oral Oral  SpO2: 99% 96% 96% 95%  Weight:   86.5 kg   Height:        Intake/Output Summary (Last 24 hours) at 08/21/2020 1206 Last data filed at 08/21/2020 0600 Gross per 24 hour  Intake 480 ml  Output --  Net 480 ml   Filed Weights   08/19/20 0547 08/20/20 0554 08/21/20 0517  Weight: 85 kg 86.3 kg 86.5 kg    Examination:   General: Not in pain or dyspnea,  Neurology: Awake and alert, non focal  E ENT: no pallor, no icterus, oral mucosa moist Cardiovascular: No JVD. S1-S2 present, rhythmic, no gallops, rubs, or murmurs. No lower extremity edema. Pulmonary: positive breath sounds bilaterally, adequate air movement, no wheezing, rhonchi or rales. Gastrointestinal. Abdomen soft (gravid) Skin. No rashes Musculoskeletal: no joint deformities     Data Reviewed: I have personally reviewed following labs and imaging studies  CBC: Recent Labs  Lab 08/15/20 0411 08/17/20 0407  WBC 6.8 6.5  HGB 8.9* 8.8*  HCT 27.7* 26.9*  MCV 92.6 93.1  PLT 295 491   Basic Metabolic Panel: Recent Labs  Lab 08/15/20 0411 08/17/20 0407 08/18/20 0433 08/20/20 0327  NA 132* 133*  --  134*  K 3.8 3.9  --  3.6  CL 98 100  --  101  CO2 26 27  --  26  GLUCOSE 91 89  --  103*  BUN 16 14   --  18  CREATININE 0.59 0.56  --  0.57  CALCIUM 9.1 9.1  --  9.1  MG  --  1.6* 1.8  --    GFR: Estimated Creatinine Clearance: 112 mL/min (by C-G formula based on SCr of 0.57 mg/dL). Liver Function Tests: No results for input(s): AST, ALT, ALKPHOS, BILITOT, PROT, ALBUMIN in the last 168 hours. No results for input(s): LIPASE, AMYLASE in the last 168 hours. No results for input(s): AMMONIA in the last 168 hours. Coagulation Profile: No results for input(s): INR, PROTIME in the last 168 hours. Cardiac Enzymes: No results for input(s): CKTOTAL, CKMB, CKMBINDEX, TROPONINI in the last 168 hours. BNP (last 3 results) No results for input(s): PROBNP in the last 8760 hours. HbA1C: No results for input(s): HGBA1C in the last 72 hours. CBG: No results for input(s): GLUCAP in the last 168 hours. Lipid Profile: No results for input(s): CHOL, HDL, LDLCALC, TRIG, CHOLHDL, LDLDIRECT in the last 72 hours. Thyroid Function Tests: No results for input(s): TSH, T4TOTAL, FREET4, T3FREE, THYROIDAB in the last 72 hours. Anemia Panel: No results for input(s):  VITAMINB12, FOLATE, FERRITIN, TIBC, IRON, RETICCTPCT in the last 72 hours.    Radiology Studies: I have reviewed all of the imaging during this hospital visit personally     Scheduled Meds: . sodium chloride   Intravenous Once  . acetaminophen  650 mg Oral Q6H  . Chlorhexidine Gluconate Cloth  6 each Topical Daily  . enoxaparin (LOVENOX) injection  40 mg Subcutaneous Q24H  . feeding supplement  237 mL Oral BID BM  . furosemide  40 mg Oral Daily  . methadone  120 mg Oral Daily  . polyethylene glycol  17 g Oral BID  . prenatal vitamin w/FE, FA  1 tablet Oral Q1200  . senna-docusate  1 tablet Oral BID  . sertraline  50 mg Oral Daily  . sodium chloride flush  10-40 mL Intracatheter Q12H   Continuous Infusions: . vancomycin (VANCOCIN) 750 mg/248m IVPB 750 mg (08/21/20 0532)     LOS: 39 days        Arelia Volpe DGerome Apley  MD

## 2020-08-22 DIAGNOSIS — R7881 Bacteremia: Secondary | ICD-10-CM | POA: Diagnosis not present

## 2020-08-22 DIAGNOSIS — I5023 Acute on chronic systolic (congestive) heart failure: Secondary | ICD-10-CM | POA: Diagnosis not present

## 2020-08-22 DIAGNOSIS — L03115 Cellulitis of right lower limb: Secondary | ICD-10-CM | POA: Diagnosis not present

## 2020-08-22 DIAGNOSIS — O10919 Unspecified pre-existing hypertension complicating pregnancy, unspecified trimester: Secondary | ICD-10-CM | POA: Diagnosis not present

## 2020-08-22 NOTE — Progress Notes (Signed)
PROGRESS NOTE    Kelly [name]  OEH:[id number] DOB: 13-Jul-1985 DOA: 07/13/2020 PCP: [facility]    Brief Narrative:  Inpatient consultation for MRSA bacteremia/endocarditis.  35 year old female past medical history for polysubstance abuse including heroin, and fentanyl. She also has bipolar disease and history endocarditis with epidural abscess. Currently she is pregnant and using intravenous drugs. Reported right upper quadrant abdominal pain for about 2 days which was persistent and severe to the point where she was unable to ambulate.  At the time of consultation blood pressure 120/70, heart rate 111, respiratory rate 24, temperature 99.3, oxygenation 90%, she had diffuse rhonchi bilaterally, heart S1-S2, present, tachycardic, positive systolic murmur, abdomen gravid, no significant lower extremity edema.  Blood cultures were positive for methicillin resistant Staphylococcus 4-4 bottles.  Transthoracic echocardiography no signs of vegetation, transesophageal echocardiogram was deferred. Considering her clinical scenario very high pretest for endocarditis.  She has been placed on empirical therapy with vancomycin plan to complete 4/14 2022.  Patient continue methadone for pain control, has been on hydromorphone for break through pain control. 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)   Tricuspid regurgitation   Supervision of other high risk pregnancies, third trimester   Preexisting hypertension complicating pregnancy, antepartum    Dental caries   Endocarditis   History of VBAC   Lumbar back pain   Unwanted fertility   Acute on chronic congestive heart failure (HCC)   Cellulitis of right lower extremity   Septic embolism (HCC)   Syphilis   [redacted] weeks gestation of pregnancy   Abdominal pain   Drug use affecting pregnancy in second trimester   Methadone maintenance treatment affecting pregnancy ([place])   Maternal endocarditis complicating pregnancy in third trimester   Pain, dental    1. MRSA bacteremia/ endocarditis/ Septic pulmonary embolism (peneumonia).Continue withlIV vancomycin per pharmacy protocol.    Scheduled to complete on 04/14inpatient due to history of IV drug abuse, not safe for outpatient IV antibiotics.  2. Acute on chronic right sided heart failure due to severe TR, complicated with severe pulmonary hypertension. Continue with no clinical sings of heart failure decompensation.   Tolerating well furosemide 40 mg daily, plus  20 meq Kcl diuretic induced hypokalemia.   3. Anemia of chronic disease.last hgb and hct on 04/06 8,8 and 26,9 respectively. Stable clinically    4. IV drug abuse// back pain/ depression/ right lower mandible teeth damage. Continue withmethadone 120 mg daily with good toleration.  Pain control with oral oxycodone and IV hydromorphone as needed, will need prompt follow up with dental surgery.   Onsertraline.  5. Hepatitis C/ syphilis. Sp penicillin 2,4 MU IM, continue follow up as outpatient.   6. Hyponatremia/ hypomagnesemia/ hypokalemia. Will need follow up renal function as outpatient   7. Pregnancy.Stable per primary team.   Status is: Inpatient  Remains inpatient appropriate because:IV treatments appropriate due to intensity of illness or inability to take PO   Dispo: The patient is from: Home              Anticipated d/c is to: Home  Patient currently is not medically stable to d/c.   Difficult to place patient No   DVT  prophylaxis: Enoxaparin   Code Status:   full  Family Communication:  No family at the bedside      Nutrition Status: Nutrition Problem: Increased nutrient needs Etiology: chronic illness (heart failure) Signs/Symptoms: estimated needs Interventions: Ensure Enlive (each supplement provides 350kcal and 20 grams of protein)     Subjective: Patient continue to have left lower mandible pain due to poor dentition, no nausea or vomiting, no chest pain or dyspnea,   Objective: Vitals:   08/21/20 1706 08/22/20 0035 08/22/20 0548 08/22/20 1145  BP: 116/79 104/65 96/75 114/79  Pulse: 72 70 71 87  Resp: 18 18 18 18   Temp: 97.7 F (36.5 C) 98.6 F (37 C) 98 F (36.7 C) (!) 97.5 F (36.4 C)  TempSrc: Oral Oral Oral Oral  SpO2: 99% 97% 96% 98%  Weight:   87.5 kg   Height:        Intake/Output Summary (Last 24 hours) at 08/22/2020 1350 Last data filed at 08/22/2020 0600 Gross per 24 hour  Intake 250 ml  Output --  Net 250 ml   Filed Weights   08/20/20 0554 08/21/20 0517 08/22/20 0548  Weight: 86.3 kg 86.5 kg 87.5 kg    Examination:   General: Not in pain or dyspnea, deconditioned  Neurology: Awake and alert, non focal  E ENT: no pallor, no icterus, oral mucosa moist Cardiovascular: No JVD. S1-S2 present, rhythmic, no gallops, rubs, or murmurs. No lower extremity edema. Pulmonary: positive breath sounds bilaterally, adequate air movement, no wheezing, rhonchi or rales. Gastrointestinal. Abdomen gravid Skin. No rashes Musculoskeletal: no joint deformities     Data Reviewed: I have personally reviewed following labs and imaging studies  CBC: Recent Labs  Lab 08/17/20 0407  WBC 6.5  HGB 8.8*  HCT 26.9*  MCV 93.1  PLT 751   Basic Metabolic Panel: Recent Labs  Lab 08/17/20 0407 08/18/20 0433 08/20/20 0327  NA 133*  --  134*  K 3.9  --  3.6  CL 100  --  101  CO2 27  --  26  GLUCOSE 89  --  103*  BUN 14  --  18  CREATININE 0.56  --  0.57  CALCIUM 9.1  --   9.1  MG 1.6* 1.8  --    GFR: Estimated Creatinine Clearance: 112.6 mL/min (by C-G formula based on SCr of 0.57 mg/dL). Liver Function Tests: No results for input(s): AST, ALT, ALKPHOS, BILITOT, PROT, ALBUMIN in the last 168 hours. No results for input(s): LIPASE, AMYLASE in the last 168 hours. No results for input(s): AMMONIA in the last 168 hours. Coagulation Profile: No results for input(s): INR, PROTIME in the last 168 hours. Cardiac Enzymes: No results for input(s): CKTOTAL, CKMB, CKMBINDEX, TROPONINI in the last 168 hours. BNP (last 3 results) No results for input(s): PROBNP in the last 8760 hours. HbA1C: No results for input(s): HGBA1C in the last 72 hours. CBG: No results for input(s): GLUCAP in the last 168 hours. Lipid Profile: No results for input(s): CHOL, HDL, LDLCALC, TRIG, CHOLHDL, LDLDIRECT in the last 72 hours. Thyroid Function Tests: No results for input(s): TSH, T4TOTAL, FREET4, T3FREE, THYROIDAB in the last 72 hours. Anemia Panel: No results for input(s): VITAMINB12, FOLATE, FERRITIN, TIBC, IRON, RETICCTPCT in the last 72 hours.    Radiology Studies: I have reviewed all of the imaging during this hospital visit personally  Scheduled Meds: . sodium chloride   Intravenous Once  . acetaminophen  650 mg Oral Q6H  . Chlorhexidine Gluconate Cloth  6 each Topical Daily  . enoxaparin (LOVENOX) injection  40 mg Subcutaneous Q24H  . feeding supplement  237 mL Oral BID BM  . furosemide  40 mg Oral Daily  . methadone  120 mg Oral Daily  . polyethylene glycol  17 g Oral BID  . potassium chloride  20 mEq Oral Daily  . prenatal vitamin w/FE, FA  1 tablet Oral Q1200  . senna-docusate  1 tablet Oral BID  . sertraline  50 mg Oral Daily  . sodium chloride flush  10-40 mL Intracatheter Q12H   Continuous Infusions: . vancomycin (VANCOCIN) 750 mg/270m IVPB 750 mg (08/22/20 0542)     LOS: 40 days        Marlyn Tondreau DGerome Apley MD

## 2020-08-22 NOTE — Progress Notes (Signed)
NST reactive and reassuring.  Dr Dione Plover updated on pt status.  No OB complaints at this time.

## 2020-08-22 NOTE — Progress Notes (Signed)
Pharmacy Antibiotic Note  Kelly [name] is a 35 y.o. female with MRSA bacteremia  And TV endocarditis. 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  Pharmacy has been consulted for vancomycin. Patient is pregnant with EGA of [redacted] weeks (4/12).  Scr remains stable at 0.57 (CrCl >100 mL/min). Remains afebrile. Planning 6 weeks of inpatient IV antibiotics. Patient clinically improving. Most recent vancomycin levels 13 on 4/7 a little on the lower side but AUC has been at goal 400s with this same trough and renal function.   Wbc wnl, afebrile   Plan: Continue vancomycin to 750 mg IV q 8 hrs will confirm with MD that DC abx plan still is for 4/14 Monitor C&S, clinical status, renal function, VT as needed Plan for levels as needed    Temp (24hrs), Avg:98 F (36.7 C), Min:97.5 F (36.4 C), Max:98.6 F (37 C)  Recent Labs  Lab 08/17/20 0407 08/18/20 1002 08/20/20 0327  WBC 6.5  --   --   CREATININE 0.56  --  0.57  VANCOTROUGH  --  13*  --     Estimated Creatinine Clearance: 112.6 mL/min (by C-G formula based on SCr of 0.57 mg/dL).    Allergies  Allergen Reactions  . Hydrocodone Itching  . Morphine And Related Nausea And Vomiting    Antimicrobials this admission: Azith 3/2 x1 CTX 3/2 x1 Vancomycin 3/2> (4/14) proposed stop date  Cefepime 3/2> 3/3  Cultures: 3/2 urine - ng 3/2 Bld - GPC - MRSA 3/2 MRSA PCR pos 3/4 BCx x 2 > Neg    Bonnita Nasuti Pharm.D. CPP, BCPS Clinical Pharmacist [phone] 08/22/2020 3:28 PM

## 2020-08-22 NOTE — TOC Progression Note (Addendum)
Transition of Care (TOC) - Progression Note  Heart Failure   Patient Details  Name: Kelly [name] MRN: [id number] Date of Birth: May 19, 1985  Transition of Care [name]) CM/SW [place], [place] Phone Number: 08/22/2020, 12:11 PM  Clinical Narrative:    CSW visited patient at bedside to bring her other perinatal resources for substance use treatment facilities in case [facility] is still full by the time she delivers the baby. Ms. Shellhammer reported to be upset due to hearing from her DSS case worker on Thursday that they will be taking the baby once she delivers. Ms. Bills reported that the DSS case worker didn't say why and wouldn't explain anything to her and she reported feeling upset about this news but understands she still needs to take care of herself and the baby. Ms. Fanguy asked if CSW could reach out to her caseworker and find out more for her and CSW agreed. Ms. Phegley reported being ready to go to the Rooms at the [facility] and they do have all the needed paperwork. Ms. Deane also reported that her phone is off and that she can't afford to turn it back on right now as it would cost $55-$60 with [employer] but she is using a free number to call and text as long as she has Internet at [phone]. CSW will check with HV team to see if we can help with getting her phone back on but if she could also ask the Rooms at the [facility] if this is something they could help with also.  CSW attempted to call Ms. Carrigan DSS caseworker [name] [phone] at 12:27pm but she didn't answer and CSW left a voicemail for her to return the call.  2:00pm: CSW contacted [name] [phone] DSS caseworker for Ms. Sirianni and she reported that yes most likely they will be taking the baby once she delivers. [name] stated that since the baby will most likely have substances in its system, Ms. Cassarino other children are in the system, and the fact that Ms. Pottinger is unemployed living in a  motel and unable to provide for the baby is why DSS will most likely take custody once Ms. Bolduc delivers the baby. [name] reported that the social worker assigned to Ms. Butz upon delivery will need to make a report to DSS at that time.  TOC will continue to follow for d/c needs.      Barriers to Discharge: Continued Medical Work [facility] Resources with medical needs,Unsafe home situation,Financial Resources,Active Substance Use - Placement  Expected Discharge Plan and Services   In-house Referral: Clinical Social Work Discharge Planning Services: CM Consult                                           Social Determinants of Health (SDOH) Interventions Food Insecurity Interventions: Other (Comment) (Pt. reports having food stamps) Financial Strain Interventions: Intervention Not Indicated,Other (Comment) (CSW working on connecting Pt. to resources) Housing Interventions: Other (Comment) (Referral to outpatient residential treatment program for pregnant women) Transportation Interventions: [facility] Alcohol Brief Interventions/Follow-up: Continued Monitoring (Pt. reports that she hasn't had a drink since her pregnancy) Depression Interventions/Treatment : Counseling,Medication (Pt. reports she has been on medication before and needs to get back on medication and she is open to counseling and meds.)  Readmission Risk Interventions Readmission Risk Prevention Plan 07/30/2018  Transportation Screening  Complete  Medication Review Press photographer) Complete  PCP or Specialist appointment within 3-5 days of discharge Complete  HRI or [place] Not Complete  HRI or Home Care Consult Pt Refusal Comments N/A  SW Recovery Care/Counseling Consult Complete  Palliative Care Screening Not Complete  Comments N/A  Skilled [place] Not Complete  SNF Comments N/A  Some recent data might be hidden   Gaige Sebo, MSW, LCSWA [phone] Heart Failure  Social Worker

## 2020-08-22 NOTE — Progress Notes (Signed)
Writer called dentist to report patient had chipped tooth that is causing a lot of pain. Dentist has an appointment with a patient at 1pm and writer is suppose to receive call back to confirm an appointment for later today.

## 2020-08-23 DIAGNOSIS — I5023 Acute on chronic systolic (congestive) heart failure: Secondary | ICD-10-CM | POA: Diagnosis not present

## 2020-08-23 DIAGNOSIS — R7881 Bacteremia: Secondary | ICD-10-CM | POA: Diagnosis not present

## 2020-08-23 DIAGNOSIS — F1990 Other psychoactive substance use, unspecified, uncomplicated: Secondary | ICD-10-CM | POA: Diagnosis not present

## 2020-08-23 DIAGNOSIS — I330 Acute and subacute infective endocarditis: Secondary | ICD-10-CM | POA: Diagnosis not present

## 2020-08-23 MED ORDER — OXYCODONE HCL 5 MG PO TABS
10.0000 mg | ORAL_TABLET | ORAL | Status: DC | PRN
  Administered 2020-08-23 (×2): 10 mg via ORAL
  Filled 2020-08-23 (×2): qty 2

## 2020-08-23 NOTE — Progress Notes (Signed)
Patient ID: Kelly [name], female   DOB: 25-Nov-1985, 35 y.o.   MRN: [id number] [place]) NOTE  Kelly [name] is a 35 y.o. [phone] at [id number] is admitted for MRSA bacteremia, endocarditis with severe tricuspid regurgitation, septic emboli with h/o discitis/epidural abscess, chronic HTN and acute on chronic right sided heart failure in the setting of IV heroin and fentanyl abuse Fetal presentation is cephalic. Length of Stay:  41  Days  Subjective: States she is stable Patient reports the fetal movement as active. Patient reports uterine contraction  activity as none. Patient reports  vaginal bleeding as none. Patient describes fluid per vagina as None.  Vitals:  Blood pressure 113/73, pulse 66, temperature 97.6 F (36.4 C), temperature source Oral, resp. rate 19, height 5' 7.5" (1.715 m), weight 87 kg, SpO2 97 %, not currently breastfeeding. Physical Examination:  General appearance - alert, well appearing, and in no distress Heart - normal rate and regular rhythm Abdomen - soft, nontender, nondistended Fundal Height:  size equals dates Cervical Exam: Not evaluated. Extremities: extremities normal, atraumatic, no cyanosis or edema and Homans sign is negative, no sign of DVT Membranes:intact  Fetal Monitoring:     Fetal Heart Rate A   Mode External filed at 08/22/2020 1730  Baseline Rate (A) 125 bpm filed at 08/22/2020 1800  Variability 6-25 BPM filed at 08/22/2020 1800  Accelerations 15 x 15 filed at 08/22/2020 1800  Decelerations None filed at 08/22/2020 1800     Labs:  No results found for this or any previous visit (from the past 24 hour(s)).   Medications:  Scheduled . sodium chloride   Intravenous Once  . acetaminophen  650 mg Oral Q6H  . Chlorhexidine Gluconate Cloth  6 each Topical Daily  . enoxaparin (LOVENOX) injection  40 mg Subcutaneous Q24H  . feeding supplement  237 mL Oral BID BM  . furosemide  40 mg Oral Daily  .  methadone  120 mg Oral Daily  . potassium chloride  20 mEq Oral Daily  . prenatal vitamin w/FE, FA  1 tablet Oral Q1200  . senna-docusate  1 tablet Oral BID  . sertraline  50 mg Oral Daily  . sodium chloride flush  10-40 mL Intracatheter Q12H   I have reviewed the patient's current medications.  ASSESSMENT: Patient Active Problem List   Diagnosis Date Noted  . Pain, dental   . Maternal endocarditis complicating pregnancy in third trimester   . Methadone maintenance treatment affecting pregnancy ([place] 07/21/2020  . Drug use affecting pregnancy in second trimester   . [redacted] weeks gestation of pregnancy   . Abdominal pain   . Septic embolism ([place]   . Syphilis   . Cellulitis of right lower extremity   . MRSA bacteremia 07/13/2020  . Acute on chronic congestive heart failure ([place] 07/13/2020  . Acute cystitis without hematuria   . MDD (major depressive disorder), recurrent severe, without psychosis ([place] 06/26/2019  . Oth psychoactive substance abuse w mood disorder ([facility] 06/26/2019  . Congenital heart defect 10/07/2018  . History of hepatitis C 10/01/2018  . Unwanted fertility 09/04/2018  . Chronic systolic heart failure ([place] 08/29/2018  . Lumbar back pain   . Hematoma   . PICC (peripherally inserted central catheter) flush   . Infection due to Streptococcus mitis group 07/20/2018  . History of VBAC 07/16/2018  . Endocarditis 07/12/2018  . Dental caries 07/03/2018  . 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 Preexisting hypertension complicating pregnancy, antepartum 06/29/2018  .  Supervision of other high risk pregnancies, third trimester 06/27/2018  . Rubella non-immune status, antepartum 06/23/2018  . 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 Tricuspid regurgitation 06/14/2018  . Difficult intravenous access   . IVDU (intravenous drug user)   . Bipolar disorder ([place]) 07/31/2016   . Major depressive disorder, recurrent episode with anxious distress ([place]) 07/29/2016  . Polysubstance abuse ([place]) 05/25/2016  . No prenatal care in current pregnancy in third trimester 05/25/2016  . Chronic hypertension during pregnancy, antepartum 05/25/2016  . History of cesarean delivery affecting pregnancy 05/25/2016  . Community acquired pneumonia 05/25/2016  . Abscess of right breast 07/13/2014  . Opioid use disorder, severe, dependence ([place]) 08/25/2013  . Unspecified episodic mood disorder 08/25/2013  . ADHD (attention deficit hyperactivity disorder) 08/25/2013  . Cocaine abuse ([place]) 08/25/2013  . Thoracic back pain 01/29/2013  . Scoliosis 11/17/2012  . CROHN'S DISEASE 05/03/2005  . COLONIC POLYPS 02/28/2003    PLAN: Patient has f/u US for today.  Medications reviewed. Vancomycin to complete 4/14.  Emeterio Reeve 08/23/2020,10:31 AM

## 2020-08-23 NOTE — Progress Notes (Signed)
Patient complaining of tooth pain throughout day. PRN oxy and dilaudid given with only minor relief. Writer called Dental consult number around 1130AM and spoke with Katie who informed me that DMD Locklear was notified.

## 2020-08-23 NOTE — Progress Notes (Signed)
NST reassuring for gestational age.  Pt has no obstetrical complaints but chipped a tooth yesterday and is in severe pain due to this. [name] Nurse reports patient is having difficulty eating and having to have dilaudid for pain due to the tooth.  Dentist consult is in, but nobody has come to see pt yet.  Dr Nehemiah Settle made aware and reviews fetal heart rate tracing.

## 2020-08-23 NOTE — Progress Notes (Signed)
PROGRESS NOTE    Kelly [name]  BEM:[id number] DOB: 08-Jul-1985 DOA: 07/13/2020 PCP: [facility]    Brief Narrative:  Inpatient consultation for MRSA bacteremia/endocarditis.  35 year old female past medical history for polysubstance abuse including heroin, and fentanyl. She also has bipolar disease and history endocarditis with epidural abscess. Currently she is pregnant and using intravenous drugs. Reported right upper quadrant abdominal pain for about 2 days which was persistent and severe to the point where she was unable to ambulate.  At the time of consultation blood pressure 120/70, heart rate 111, respiratory rate 24, temperature 99.3, oxygenation 90%, she had diffuse rhonchi bilaterally, heart S1-S2, present, tachycardic, positive systolic murmur, abdomen gravid, no significant lower extremity edema.  Blood cultures were positive for methicillin resistant Staphylococcus 4-4 bottles.  Transthoracic echocardiography no signs of vegetation, transesophageal echocardiogram was deferred. Considering her clinical scenario very high pretest for endocarditis.  She has been placed on empirical therapy with vancomycin plan to complete 4/14 2022.  Patient continue methadone for pain control, has been on hydromorphone for break through pain control. Plan to slowly wean off hydromorphone in preparation for her discharge.  Continue to have left mandible pain.    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)   Tricuspid regurgitation   Supervision of other high risk pregnancies, third trimester   Preexisting hypertension  complicating pregnancy, antepartum   Dental caries   Endocarditis   History of VBAC   Lumbar back pain   Unwanted fertility   Acute on chronic congestive heart failure (HCC)   Cellulitis of right lower extremity   Septic embolism (HCC)   Syphilis   [redacted] weeks gestation of pregnancy   Abdominal pain   Drug use affecting pregnancy in second trimester   Methadone maintenance treatment affecting pregnancy ([place])   Maternal endocarditis complicating pregnancy in third trimester   Pain, dental   1. MRSA bacteremia/ endocarditis/ Septic pulmonary embolism ([place]).IV vancomycinper pharmacy protocol.  Plan to complete on 04/14antibiotic therapy. Inpatient therapy due to history of IV drug abuse, not safe for outpatient IV antibiotics.  2. Acute on chronic right sided heart failure due to severe TR, complicated with severe pulmonary hypertension. No clinical signs of heart failure decompensation.   Continue with furosemide 40 mg daily, along with  20 meq Kcl diuretic induced hypokalemia.  3. Anemia of chronic disease.last hgb and hct on 04/06 8,8 and 26,9 respectively. Stable clinically   4. IV drug abuse// back pain/ depression/ right lower mandible teeth damage. On methadone 120 mg dailywith good toleration.   Patient will need prompt follow up as outpatient. Her pain has been worsening over last 48 hrs, will increase dose of oxycodone to 10 mg as  needed and continue with IV hydromorphone as needed, for break through pain control.  Continue withsertraline.  5. Hepatitis C/ syphilis. Sp penicillin 2,4 MU IM, continue follow up as outpatient.   6. Hyponatremia/ hypomagnesemia/ hypokalemia. renal function has been stable, continue with K repletion.   7. Pregnancy.Stable per primary team.   Status is: Inpatient  Remains inpatient appropriate because:IV treatments appropriate due to intensity of illness or inability to take PO  Dispo: The patient is  from: Home              Anticipated d/c is to: Home              Patient currently is not medically stable to d/c.   Difficult to place patient No  DVT prophylaxis: Enoxaparin   Code Status:   full  Family Communication:  No family at the bedside      Nutrition Status: Nutrition Problem: Increased nutrient needs Etiology: chronic illness (heart failure) Signs/Symptoms: estimated needs Interventions: Ensure Enlive (each supplement provides 350kcal and 20 grams of protein)     Subjective: Patient complains of left mandible pain, persistent, no radiation, no nausea or vomiting.   Objective: Vitals:   08/22/20 1145 08/22/20 1702 08/22/20 2337 08/23/20 0626  BP: 114/79 113/70 102/65 113/73  Pulse: 87 72 73 66  Resp: 18 18 18 19   Temp: (!) 97.5 F (36.4 C) 97.7 F (36.5 C) 97.9 F (36.6 C) 97.6 F (36.4 C)  TempSrc: Oral Oral Oral Oral  SpO2: 98% 98% 97% 97%  Weight:    87 kg  Height:        Intake/Output Summary (Last 24 hours) at 08/23/2020 0926 Last data filed at 08/22/2020 1500 Gross per 24 hour  Intake 250 ml  Output --  Net 250 ml   Filed Weights   08/21/20 0517 08/22/20 0548 08/23/20 0626  Weight: 86.5 kg 87.5 kg 87 kg    Examination:   General: Not in pain or dyspnea.  Neurology: Awake and alert, non focal  E ENT: no pallor, no icterus, oral mucosa moist Cardiovascular: No JVD. S1-S2 present, rhythmic, no gallops, rubs, or murmurs. No lower extremity edema. Pulmonary: positive breath sounds bilaterally, adequate air movement, no wheezing, rhonchi or rales. Gastrointestinal. Abdomen gravid Skin. No rashes Musculoskeletal: no joint deformities     Data Reviewed: I have personally reviewed following labs and imaging studies  CBC: Recent Labs  Lab 08/17/20 0407  WBC 6.5  HGB 8.8*  HCT 26.9*  MCV 93.1  PLT 569   Basic Metabolic Panel: Recent Labs  Lab 08/17/20 0407 08/18/20 0433 08/20/20 0327  NA 133*  --  134*  K 3.9  --  3.6  CL 100   --  101  CO2 27  --  26  GLUCOSE 89  --  103*  BUN 14  --  18  CREATININE 0.56  --  0.57  CALCIUM 9.1  --  9.1  MG 1.6* 1.8  --    GFR: Estimated Creatinine Clearance: 112.3 mL/min (by C-G formula based on SCr of 0.57 mg/dL). Liver Function Tests: No results for input(s): AST, ALT, ALKPHOS, BILITOT, PROT, ALBUMIN in the last 168 hours. No results for input(s): LIPASE, AMYLASE in the last 168 hours. No results for input(s): AMMONIA in the last 168 hours. Coagulation Profile: No results for input(s): INR, PROTIME in the last 168 hours. Cardiac Enzymes: No results for input(s): CKTOTAL, CKMB, CKMBINDEX, TROPONINI in the last 168 hours. BNP (last 3 results) No results for input(s): PROBNP in the last 8760 hours. HbA1C: No results for input(s): HGBA1C in the last 72 hours. CBG: No results for input(s): GLUCAP in the last 168 hours. Lipid Profile: No results for input(s): CHOL, HDL, LDLCALC, TRIG, CHOLHDL, LDLDIRECT in the last 72 hours. Thyroid Function Tests: No results for input(s): TSH, T4TOTAL, FREET4, T3FREE, THYROIDAB in the last 72 hours. Anemia Panel: No results for input(s): VITAMINB12, FOLATE, FERRITIN, TIBC, IRON,  RETICCTPCT in the last 72 hours.    Radiology Studies: I have reviewed all of the imaging during this hospital visit personally     Scheduled Meds: . sodium chloride   Intravenous Once  . acetaminophen  650 mg Oral Q6H  . Chlorhexidine Gluconate Cloth  6 each Topical Daily  . enoxaparin (LOVENOX) injection  40 mg Subcutaneous Q24H  . feeding supplement  237 mL Oral BID BM  . furosemide  40 mg Oral Daily  . methadone  120 mg Oral Daily  . potassium chloride  20 mEq Oral Daily  . prenatal vitamin w/FE, FA  1 tablet Oral Q1200  . senna-docusate  1 tablet Oral BID  . sertraline  50 mg Oral Daily  . sodium chloride flush  10-40 mL Intracatheter Q12H   Continuous Infusions: . vancomycin (VANCOCIN) 750 mg/217m IVPB 750 mg (08/23/20 0624)     LOS: 41  days        Canna Nickelson DGerome Apley MD

## 2020-08-24 ENCOUNTER — Inpatient Hospital Stay (HOSPITAL_BASED_OUTPATIENT_CLINIC_OR_DEPARTMENT_OTHER): Payer: Medicaid Other

## 2020-08-24 DIAGNOSIS — R9431 Abnormal electrocardiogram [ECG] [EKG]: Secondary | ICD-10-CM | POA: Diagnosis present

## 2020-08-24 DIAGNOSIS — Z3A32 32 weeks gestation of pregnancy: Secondary | ICD-10-CM

## 2020-08-24 DIAGNOSIS — I071 Rheumatic tricuspid insufficiency: Secondary | ICD-10-CM | POA: Diagnosis present

## 2020-08-24 DIAGNOSIS — Z3A25 25 weeks gestation of pregnancy: Secondary | ICD-10-CM | POA: Diagnosis not present

## 2020-08-24 DIAGNOSIS — Z362 Encounter for other antenatal screening follow-up: Secondary | ICD-10-CM | POA: Diagnosis not present

## 2020-08-24 DIAGNOSIS — N3000 Acute cystitis without hematuria: Secondary | ICD-10-CM | POA: Diagnosis not present

## 2020-08-24 DIAGNOSIS — O99323 Drug use complicating pregnancy, third trimester: Secondary | ICD-10-CM

## 2020-08-24 DIAGNOSIS — A419 Sepsis, unspecified organism: Secondary | ICD-10-CM | POA: Diagnosis not present

## 2020-08-24 DIAGNOSIS — I079 Rheumatic tricuspid valve disease, unspecified: Secondary | ICD-10-CM | POA: Diagnosis present

## 2020-08-24 DIAGNOSIS — I2690 Septic pulmonary embolism without acute cor pulmonale: Secondary | ICD-10-CM | POA: Diagnosis present

## 2020-08-24 DIAGNOSIS — R7881 Bacteremia: Secondary | ICD-10-CM | POA: Diagnosis not present

## 2020-08-24 DIAGNOSIS — O322XX0 Maternal care for transverse and oblique lie, not applicable or unspecified: Secondary | ICD-10-CM

## 2020-08-24 DIAGNOSIS — I272 Pulmonary hypertension, unspecified: Secondary | ICD-10-CM

## 2020-08-24 DIAGNOSIS — F1410 Cocaine abuse, uncomplicated: Secondary | ICD-10-CM

## 2020-08-24 DIAGNOSIS — O10013 Pre-existing essential hypertension complicating pregnancy, third trimester: Secondary | ICD-10-CM | POA: Diagnosis not present

## 2020-08-24 DIAGNOSIS — O34219 Maternal care for unspecified type scar from previous cesarean delivery: Secondary | ICD-10-CM

## 2020-08-24 DIAGNOSIS — O99413 Diseases of the circulatory system complicating pregnancy, third trimester: Secondary | ICD-10-CM

## 2020-08-24 DIAGNOSIS — R6520 Severe sepsis without septic shock: Secondary | ICD-10-CM | POA: Diagnosis present

## 2020-08-24 DIAGNOSIS — Z8619 Personal history of other infectious and parasitic diseases: Secondary | ICD-10-CM

## 2020-08-24 DIAGNOSIS — O09523 Supervision of elderly multigravida, third trimester: Secondary | ICD-10-CM

## 2020-08-24 DIAGNOSIS — F1910 Other psychoactive substance abuse, uncomplicated: Secondary | ICD-10-CM

## 2020-08-24 DIAGNOSIS — I5023 Acute on chronic systolic (congestive) heart failure: Secondary | ICD-10-CM | POA: Diagnosis present

## 2020-08-24 LAB — CBC
HCT: 29.5 % — ABNORMAL LOW (ref 36.0–46.0)
Hemoglobin: 9.3 g/dL — ABNORMAL LOW (ref 12.0–15.0)
MCH: 30 pg (ref 26.0–34.0)
MCHC: 31.5 g/dL (ref 30.0–36.0)
MCV: 95.2 fL (ref 80.0–100.0)
Platelets: 286 10*3/uL (ref 150–400)
RBC: 3.1 MIL/uL — ABNORMAL LOW (ref 3.87–5.11)
RDW: 17 % — ABNORMAL HIGH (ref 11.5–15.5)
WBC: 6.8 10*3/uL (ref 4.0–10.5)
nRBC: 0 % (ref 0.0–0.2)

## 2020-08-24 LAB — BASIC METABOLIC PANEL
Anion gap: 7 (ref 5–15)
BUN: 14 mg/dL (ref 6–20)
CO2: 25 mmol/L (ref 22–32)
Calcium: 9.1 mg/dL (ref 8.9–10.3)
Chloride: 100 mmol/L (ref 98–111)
Creatinine, Ser: 0.51 mg/dL (ref 0.44–1.00)
GFR, Estimated: >60 mL/min (ref >60)
Glucose, Bld: 87 mg/dL (ref 70–99)
Potassium: 3.9 mmol/L (ref 3.5–5.1)
Sodium: 132 mmol/L — ABNORMAL LOW (ref 135–145)

## 2020-08-24 NOTE — Consult Note (Signed)
Medical Consultation   Kelly [name]  UXN:[id number]  DOB: 1985-12-18  DOA: 07/13/2020  PCP: [facility]    Requesting physician: Dr. Emeterio Reeve OB/GYN   Reason for consultation: Multiple medical problems   History of Present Illness: 35 year old WF PMHx Polysubstance abuse including heroin, and fentanyl. She also has bipolar disease and history endocarditis with epidural abscess. Currently she is pregnant and using intravenous drugs.   Reported right upper quadrant abdominal pain for about 2 days which was persistent and severe to the point where she was unable to ambulate.  At the time of consultation blood pressure 120/70, heart rate 111, respiratory rate 24, temperature 99.3, oxygenation 90%, she had diffuse rhonchi bilaterally, heart S1-S2, present, tachycardic, positive systolic murmur, abdomen gravid, no significant lower extremity edema.  Blood cultures were positive for methicillin resistant Staphylococcus 4-4 bottles.  Transthoracic echocardiography no signs of vegetation, transesophageal echocardiogram was deferred. Considering her clinical scenario very high pretest for endocarditis.  She has been placed on empirical therapy with vancomycin plan to complete 4/14 2022.  Patient continue methadone for pain control, has been on hydromorphone for break through pain control. Plan to slowly wean off hydromorphone in preparation for her discharge.  Continue to have left mandible pain.   4/13 afebrile overnight states fractured her left lower molar a couple days ago now causes her pain whenever she attempts to chew food.   Covid vaccination; Review of Systems:  Review of Systems  Constitutional: Negative.   HENT: Negative.   Eyes: Negative.   Respiratory: Negative.   Cardiovascular: Negative.   Gastrointestinal: Negative.   Genitourinary: Negative.   Musculoskeletal: Negative.   Skin: Negative.   Neurological: Negative.    Endo/Heme/Allergies: Negative.   Psychiatric/Behavioral: Positive for substance abuse.      Past Medical History: Past Medical History:  Diagnosis Date  . 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 Abscess of breast    rt breast  . ADD (attention deficit disorder)   . Back pain   . Bacteremia due to Enterococcus 06/15/2018  . Bipolar 1 disorder ([place])   . Endocarditis 2020  . Foreign body granuloma of soft tissue, NEC, right upper arm   . History of hepatitis C 10/01/2018  . Hypertension   . Influenza B 06/17/2018  . Leukocytosis   . Multifocal pneumonia 06/14/2018  . Polysubstance abuse ([name])   . Pyelonephritis affecting pregnancy 05/24/2016  . Sepsis ([place]) 12/02/2017  . Suicidal overdose [name])     Past Surgical History: Past Surgical History:  Procedure Laterality Date  . BACK SURGERY     scoliosis-in care everywhere 08/1999  . CESAREAN SECTION    . CHOLECYSTECTOMY  March 2015  . FOREIGN BODY REMOVAL Right 05/28/2016   Procedure: REMOVAL FOREIGN BODY RIGHT ARM;  Surgeon: Meredith Pel, MD;  Location: [place];  Service: Orthopedics;  Laterality: Right;  . IRRIGATION AND DEBRIDEMENT ABSCESS Right 07/13/2014   Procedure: IRRIGATION AND DEBRIDEMEN TRIGHT BREAST ABSCESS;  Surgeon: Donnie Mesa, MD;  Location: [place];  Service: General;  Laterality: Right;  . LUMBAR LAMINECTOMY/DECOMPRESSION MICRODISCECTOMY N/A 12/05/2017   Procedure: LUMBAR LAMINECTOMY FOR EPIDURAL ABSCESS;  Surgeon: Newman Pies, MD;  Location: [place];  Service: Neurosurgery;  Laterality: N/A;  . MULTIPLE EXTRACTIONS WITH ALVEOLOPLASTY Bilateral 07/03/2018   Procedure: Extraction of tooth #'s 30 and 31 with alveoloplasty;  Surgeon: Lenn Cal, DDS;  Location: [facility] OR;  Service: Oral Surgery;  Laterality: Bilateral;  . RADIOLOGY WITH ANESTHESIA N/A 12/05/2017   Procedure: MRI WITH ANESTHESIA OF [name] AND LUMBAR SPINE WITH AND WITHOUT;  Surgeon: Radiologist, Medication, MD;  Location: [place];   Service: Radiology;  Laterality: N/A;  . RIGHT/LEFT HEART CATH AND CORONARY ANGIOGRAPHY N/A 05/21/2019   Procedure: RIGHT/LEFT HEART CATH AND CORONARY ANGIOGRAPHY;  Surgeon: Jolaine Artist, MD;  Location: [place] CV LAB;  Service: Cardiovascular;  Laterality: N/A;  . TEE WITHOUT CARDIOVERSION N/A 05/21/2019   Procedure: TRANSESOPHAGEAL ECHOCARDIOGRAM (TEE);  Surgeon: Jolaine Artist, MD;  Location: [facility] ENDOSCOPY;  Service: Cardiovascular;  Laterality: N/A;     Allergies:   Allergies  Allergen Reactions  . Hydrocodone Itching  . Morphine And Related Nausea And Vomiting     Social History:  reports that she quit smoking about 2 years ago. Her smoking use included cigarettes. She has a 13.00 pack-year smoking history. She has never used smokeless tobacco. She reports previous alcohol use. She reports current drug use. Drugs: Cocaine, Heroin, IV, and Fentanyl.   Family History: Family History  Problem Relation Age of Onset  . Diabetes Mother   . Diabetes Father   . Hypertension Father   . Heart attack Neg Hx   . Hyperlipidemia Neg Hx   . Sudden death Neg Hx      Consults:  Dr. Haroldine Laws Cardiology/CHF ID Dr. Tommy Medal       Procedures/Significant Events:  3/2 echocardiogram limited;Left Ventricle: LVEF 55%.  -Right Ventricle:severely enlarged. systolic function is moderately reduced.  Severely elevated pulmonary artery systolic pressure. Estimated right ventricular systolic pressure is 37.6 mmHg.  -Right Atrium: severely dilated.  -Tricuspid Valve: regurgitation is severe.      I have personally reviewed and interpreted all radiology studies and my findings are as above.  VENTILATOR SETTINGS:   Cultures 3/2 blood positive MRSA x2 3/2 MRSA by PCR positive 3/2 urine negative 3/4 blood right AC negative 3/4 blood RIGHT forearm negative  Antimicrobials: Anti-infectives (From admission, onward)   Start     Ordered Stop   08/19/20 2300  vancomycin  (VANCOCIN) 750 mg in sodium chloride 0.9 % 250 mL IVPB        08/19/20 2207     08/19/20 2145  vancomycin (VANCOCIN) 750 mg in sodium chloride 0.9 % 150 mL IVPB  Status:  Discontinued        08/19/20 2053 08/19/20 2207   08/17/20 0400  vancomycin (VANCOCIN) IVPB 750 mg/150 ml premix  Status:  Discontinued        08/16/20 2336 08/19/20 2053   08/16/20 1200  vancomycin (VANCOCIN) 750 mg in sodium chloride 0.9 % 250 mL IVPB  Status:  Discontinued        08/16/20 0448 08/16/20 2335   08/11/20 1600  ceFAZolin (ANCEF) IVPB 1 g/50 mL premix  Status:  Discontinued        08/11/20 1315 08/11/20 1316   07/22/20 1045  amoxicillin-clavulanate (AUGMENTIN) 875-125 MG per tablet 1 tablet        07/22/20 0952 07/31/20 2023   07/18/20 0830  vancomycin (VANCOCIN) IVPB 750 mg/150 ml premix  Status:  Discontinued        07/18/20 0750 08/16/20 0448   07/16/20 0400  Vancomycin (VANCOCIN) 1,250 mg in sodium chloride 0.9 % 250 mL IVPB  Status:  Discontinued        07/15/20 1944 07/18/20 0749   07/15/20 1715  penicillin g benzathine (BICILLIN LA)  1200000 UNIT/2ML injection 2.4 Million Units        07/15/20 1621 07/15/20 1747   07/14/20 0300  vancomycin (VANCOCIN) IVPB 1000 mg/200 mL premix        07/13/20 1806 07/15/20 2059   07/13/20 1730  Vancomycin (VANCOCIN) 1,500 mg in sodium chloride 0.9 % 500 mL IVPB        07/13/20 1709 07/13/20 2116   07/13/20 1730  ceFEPIme (MAXIPIME) 2 g in sodium chloride 0.9 % 100 mL IVPB  Status:  Discontinued        07/13/20 1709 07/14/20 1050   07/13/20 1315  cefTRIAXone (ROCEPHIN) 1 g in sodium chloride 0.9 % 100 mL IVPB        07/13/20 1306 07/13/20 1538   07/13/20 1315  azithromycin (ZITHROMAX) 500 mg in sodium chloride 0.9 % 250 mL IVPB        07/13/20 1306 07/13/20 1644      Devices   LINES / TUBES:      Continuous Infusions: . vancomycin (VANCOCIN) 750 mg/254m IVPB 750 mg (08/24/20 1509)     Physical Exam: Vitals:   08/23/20 1300 08/23/20 1724 08/24/20  0016 08/24/20 0626  BP: 121/79 107/69 100/60 106/71  Pulse: 87 62 72 71  Resp: 18 18 18 18   Temp: 97.6 F (36.4 C) 98.4 F (36.9 C) 97.7 F (36.5 C) 97.7 F (36.5 C)  TempSrc: Oral Oral Oral Oral  SpO2: 98% 96% 95% 98%  Weight:    87.3 kg  Height:        General: A/O x4, No acute respiratory distress, unable to consume appropriate amount of food secondary to broken lower left molar Eyes: negative scleral hemorrhage, negative anisocoria, negative icterus ENT: Negative Runny nose, negative gingival bleeding, extremely poor dentation, broken lower left molar Neck:  Negative scars, masses, torticollis, lymphadenopathy, JVD Lungs: Clear to auscultation bilaterally without wheezes or crackles Cardiovascular: Regular rate and rhythm without murmur gallop or rub normal S1 and S2 Abdomen: negative abdominal pain, appropriately distended for pregnancy, positive soft, bowel sounds, no rebound, no ascites, no appreciable mass Extremities: No significant cyanosis, clubbing, or edema bilateral lower extremities Skin: Negative rashes, lesions, ulcers Psychiatric:  Negative depression, negative anxiety, negative fatigue, negative mania  Central nervous system:  Cranial nerves II through XII intact, tongue/uvula midline, all extremities muscle strength 5/5, sensation intact throughout, negative dysarthria, negative expressive aphasia, negative receptive aphasia.  Data reviewed:  I have personally reviewed following labs and imaging studies Labs:  CBC: Recent Labs  Lab 08/24/20 0303  WBC 6.8  HGB 9.3*  HCT 29.5*  MCV 95.2  PLT 2774   Basic Metabolic Panel: Recent Labs  Lab 08/18/20 0433 08/20/20 0327 08/24/20 0303  NA  --  134* 132*  K  --  3.6 3.9  CL  --  101 100  CO2  --  26 25  GLUCOSE  --  103* 87  BUN  --  18 14  CREATININE  --  0.57 0.51  CALCIUM  --  9.1 9.1  MG 1.8  --   --    GFR Estimated Creatinine Clearance: 112.5 mL/min (by C-G formula based on SCr of 0.51  mg/dL). Liver Function Tests: No results for input(s): AST, ALT, ALKPHOS, BILITOT, PROT, ALBUMIN in the last 168 hours. No results for input(s): LIPASE, AMYLASE in the last 168 hours. No results for input(s): AMMONIA in the last 168 hours. Coagulation profile No results for input(s): INR, PROTIME in the last 168 hours.  Cardiac Enzymes: No results for input(s): CKTOTAL, CKMB, CKMBINDEX, TROPONINI in the last 168 hours. BNP: Invalid input(s): POCBNP CBG: No results for input(s): GLUCAP in the last 168 hours. D-Dimer No results for input(s): DDIMER in the last 72 hours. Hgb A1c No results for input(s): HGBA1C in the last 72 hours. Lipid Profile No results for input(s): CHOL, HDL, LDLCALC, TRIG, CHOLHDL, LDLDIRECT in the last 72 hours. Thyroid function studies No results for input(s): TSH, T4TOTAL, T3FREE, THYROIDAB in the last 72 hours.  Invalid input(s): FREET3 Anemia work up No results for input(s): VITAMINB12, FOLATE, FERRITIN, TIBC, IRON, RETICCTPCT in the last 72 hours. Urinalysis    Component Value Date/Time   COLORURINE AMBER (A) 07/13/2020 1320   APPEARANCEUR CLOUDY (A) 07/13/2020 1320   APPEARANCEUR Hazy 07/10/2013 1121   LABSPEC 1.028 07/13/2020 1320   LABSPEC 1.020 07/10/2013 1121   PHURINE 5.0 07/13/2020 1320   GLUCOSEU NEGATIVE 07/13/2020 1320   GLUCOSEU Negative 07/10/2013 1121   HGBUR SMALL (A) 07/13/2020 1320   BILIRUBINUR NEGATIVE 07/13/2020 1320   BILIRUBINUR Negative 07/10/2013 1121   KETONESUR 5 (A) 07/13/2020 1320   PROTEINUR 100 (A) 07/13/2020 1320   UROBILINOGEN 1.0 02/15/2015 0530   NITRITE POSITIVE (A) 07/13/2020 1320   LEUKOCYTESUR LARGE (A) 07/13/2020 1320   LEUKOCYTESUR Trace 07/10/2013 1121     Microbiology No results found for this or any previous visit (from the past 240 hour(s)).     Inpatient Medications:   Scheduled Meds: . sodium chloride   Intravenous Once  . acetaminophen  650 mg Oral Q6H  . Chlorhexidine Gluconate Cloth  6  each Topical Daily  . enoxaparin (LOVENOX) injection  40 mg Subcutaneous Q24H  . feeding supplement  237 mL Oral BID BM  . furosemide  40 mg Oral Daily  . methadone  120 mg Oral Daily  . potassium chloride  20 mEq Oral Daily  . prenatal vitamin w/FE, FA  1 tablet Oral Q1200  . senna-docusate  1 tablet Oral BID  . sertraline  50 mg Oral Daily  . sodium chloride flush  10-40 mL Intracatheter Q12H   Continuous Infusions: . vancomycin (VANCOCIN) 750 mg/275m IVPB 750 mg (08/24/20 1509)     Radiological Exams on Admission: [place]FM FETAL BPP WO NON STRESS  Result Date: 08/24/2020 ----------------------------------------------------------------------  OBSTETRICS REPORT                       (Signed Final 08/24/2020 10:22 am) ---------------------------------------------------------------------- Patient Info  ID #:       [id number]                         D.O.B.:  0Oct 09, 1987(34 yrs)  Name:       [name]             Visit Date: 08/24/2020 08:31 am ---------------------------------------------------------------------- Performed By  Attending:        CSander Nephew     Ref. Address:     [address]  Performed By:     Valda Favia          [facility]                    [place]  Referred By:      Sloan Leiter                    MD ---------------------------------------------------------------------- Orders  #  Description                           Code        Ordered By  1  [place] MFM FETAL BPP WO NON               50539.76    JENNIFER OZAN     STRESS ----------------------------------------------------------------------  #  Order #                     Accession #                Episode #  1  [id number]                   [id number]                 [id number] ---------------------------------------------------------------------- Indications   [redacted] weeks gestation of pregnancy                D6Q.22  Drug use complicating pregnancy, third         O99.323  trimester  Hypertension - Chronic/Pre-existing            O10.019  Heart disease in mother affecting pregnancy    O99.413  in third trimester  Advanced maternal age multigravida 42+,        O28.523  third trimester  Previous cesarean delivery, antepartum x1      O34.219  Heart disease in mother affecting pregnancy    O99.413  in third trimester  Encounter for other antenatal screening        Z36.2  follow-up ---------------------------------------------------------------------- Fetal Evaluation  Num Of Fetuses:         1  Fetal Heart Rate(bpm):  133  Cardiac Activity:       Observed  Presentation:           Transverse, head to maternal right  Placenta:               Anterior  P. Cord Insertion:      Previously Visualized  Amniotic Fluid  AFI FV:      Within normal limits  AFI Sum(cm)     %Tile       Largest Pocket(cm)  20.5            78          6.9  RUQ(cm)       RLQ(cm)       LUQ(cm)        LLQ(cm)  4.5           5.9           3.2  6.9 ---------------------------------------------------------------------- Biophysical Evaluation  Amniotic F.V:   Within normal limits       F. Tone:        Observed  F. Movement:    Observed                   Score:          8/8  F. Breathing:   Observed ---------------------------------------------------------------------- OB History  Gravidity:    7         Term:   3        Prem:   0        SAB:   3  TOP:          0       Ectopic:  0        Living: 3 ---------------------------------------------------------------------- Gestational Age  Best:          32w 1d     Det. By:  U/S  (07/13/20)          EDD:   10/18/20 ---------------------------------------------------------------------- Anatomy  Thoracic:              Appears normal         Bladder:                Appears normal  Stomach:               Appears normal, left                         sided  ---------------------------------------------------------------------- Impression  Antenatal testing performed given maternal chronic  hypertension  The biophysical profile was 8/8 with good fetal movement and  amniotic fluid volume. ---------------------------------------------------------------------- Recommendations  Clinical correlation recommended. ----------------------------------------------------------------------               Sander Nephew, MD Electronically Signed Final Report   08/24/2020 10:22 am ----------------------------------------------------------------------   Impression/Recommendations Principal Problem:   MRSA bacteremia Active Problems:   Thoracic back pain   Opioid use disorder, severe, dependence (HCC)   Cocaine abuse ([place])   Polysubstance abuse ([place])   No prenatal care in current pregnancy in third trimester   Chronic hypertension during pregnancy, antepartum   Community acquired pneumonia   IVDU (intravenous drug user)   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)   Tricuspid regurgitation   Supervision of other high risk pregnancies, third trimester   Preexisting hypertension complicating pregnancy, antepartum   Dental caries   Endocarditis   History of VBAC   Lumbar back pain   Unwanted fertility   Acute on chronic congestive heart failure ([place])   Acute cystitis without hematuria   Cellulitis of right lower extremity   Septic embolism (HCC)   Syphilis   Abdominal pain   Drug use affecting pregnancy in second trimester   Methadone maintenance treatment affecting pregnancy ([place])   Maternal endocarditis complicating pregnancy in third trimester   Pain, dental   History of hepatitis C   Septic pulmonary embolism (HCC)   Severe sepsis (HCC)   Acute on chronic systolic CHF (congestive heart failure) (HCC)   Severe pulmonary hypertension (HCC)   Endocarditis of tricuspid valve   Tricuspid valve regurgitation   Prolonged  QT interval  MRSA bacteremia/endocarditis/septic pulmonary embolism -Continue vancomycin per pharmacy protocol.  Last dose vancomycin on 4/14  Severe Sepsis Secondary to UTI and Possible Pneumonia -Upon admission patient met criteria for severe sepsis, WBC> 12, Lactic acid= 3.2 -  Site of infection MRSA bacteremia -Initially managed by PCCM  Acute on Chronic Systolic CHF with Mostly Right Sided CHF -Likely Secondary to Severe TR -3/14 lower extremity edema, SOB/DOE resolved -Strict in and out -Daily weight -4/13 discussed case with Dr. Haroldine Laws cardiology and from cardiology standpoint patient does not require TEE prior to discharge. -4/13 follow-up with Dr. Haroldine Laws CHF clinic in 3 to 4 weeks post discharge  Severe pulmonary hypertension -See CHF  Recurrent Tricuspid Endocarditis with Severe TR - Recurrent endocarditis of tricuspid valve in 11/2017 and again in 06/2018 secondary to IV drug use. She was seen by Dr. Servando Snare in 05/2019 for possible surgical repair. However, patient was lost to follow-up and unfortunately started using drugs again. - Echo this admission shows severe TR but no signs of vegetation.  Prolonged QTc - QTc on 636 ms. -Avoid QT prolonging medication -4/13 repeat EKG  Anemia of chronic disease -Last hgb and hct on 04/06 8,8 and 26,9 respectively. Lab Results  Component Value Date   HGB 9.3 (L) 08/24/2020   HGB 8.8 (L) 08/17/2020   HGB 8.9 (L) 08/15/2020   HGB 8.3 (L) 08/13/2020   HGB 8.1 (L) 08/12/2020  -Stable  Hepatitis C/syphilis -Sp penicillin 2,4 MU IM, continue follow up as outpatient.   Substance abuse -3/2 UDS positive amphetamines, cocaine - Patient reports using mostly Fentanyl (at least 3 grams per day) but also notes using Heroin and cocaine. -Upon discharge patient will enter into a drug rehab program on 4/15.  Initially will be discharged to them [facility] per LCSW where they will monitor her for drug abuse  LEFT lower mandible teeth  damage/Back pain. -Patient fractured her left lower molar: Down to the gumline. -Spoke with DDS Sandi Mariscal, who is familiar with patient and states that patient really needs to have all of her teeth removed.  However given that she is pregnant would not do it emergently.  She is going to look into case and recommend an oral surgeon.  Will await recommendation on who patient needs to see for tooth extraction. -Pain management per OB/GYN (primary team)  Poor dentation -See LEFT lower mandible teeth damage  Depression -Zoloft 50 mg daily  Pregnancy -Care per primary team (OB/GYN)        Thank you for this consultation.  Our [name] hospitalist team will follow the patient with you.   Time Spent: 60 minutes  Brandonn Capelli, Geraldo Docker M.D. [facility] 08/24/2020, 5:09 PM

## 2020-08-24 NOTE — Progress Notes (Signed)
Nutrition Follow-up  DOCUMENTATION CODES:  Not applicable  INTERVENTION:  Continue Ensure Enlive po BID, each supplement provides 350 kcal and 20 grams of protein.  Continue snacks between meals.   Encourage adequate PO intake.   Continue prenatal vitamin once daily.    NUTRITION DIAGNOSIS:  Increased nutrient needs related to chronic illness (heart failure) as evidenced by estimated needs.  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 Pt noted to be fluid overload in addition blood cultures were positive for MRSA. Pt found to be pregnant.   Pt is currently [redacted] weeks pregnant.  Plans for 6 weeks of IV antibiotics for MRSA sepsis, suspected recurrent endocarditis.  Patient to remain inpatient until IV antibiotics are completed 4/14  Remains on a regular diet. Ensure Enlive/Plus BID between meals as well as snacks TID between meals. No meal intakes documented since last assessment.  Pt sleeping at the time of visit, lights off in room. Noted that pt is scheduled to complete her antibiotics course. Complaining of difficulty eating over the last few days due to tooth pain from a chipped tooth.   Labs and medications reviewed.   Weight up almost 6 lbs since last week.   Diet Order:   Diet Order            Diet regular Room  service appropriate? Yes; Fluid consistency: Thin  Diet effective now                EDUCATION NEEDS:  Education needs have been addressed  Skin:  Skin Assessment: Reviewed RN Assessment  Last BM:  4/11 per RN documentation  Height:  Ht Readings from Last 1 Encounters:  07/13/20 5' 7.5" (1.715 m)   Weight:  Wt Readings from Last 1 Encounters:  08/24/20 87.3 kg   BMI:  Body mass index is 29.7 kg/m.  Estimated Nutritional Needs:   Kcal:  2100-2300  Protein:  105-115 grams  Fluid:  >/= 2 L/day   Ranell Patrick, RD, LDN Clinical Dietitian Pager on [place]

## 2020-08-24 NOTE — TOC Progression Note (Signed)
Transition of Care (TOC) - Progression Note  Heart Failure   Patient Details  Name: Kelly [name] MRN: [id number] Date of Birth: [birth year]  Transition of Care [name]) CM/SW [place], [place] Phone Number: 08/24/2020, 5:27 PM  Clinical Narrative:    CSW spoke with patient at bedside about getting her phone turned back on. Patient reported it would be $60 and provided CSW with her log in information with [facility] to get her phone back on. CSW was able to get with the HF clinic to provide financial support for getting the patients phone bill paid. CSW did some motivational interviewing with the patient and provided her with substance use resources including Narcotics Anonymous free online meetings and encouraged the patient that she can change the outcome with CPS if she is invested in her sobriety but it is her choice.  CSW will continue to follow throughout discharge.       Barriers to Discharge: Continued Medical Work WPS Resources with medical needs,Unsafe home situation,Financial Resources,Active Substance Use - Placement  Expected Discharge Plan and Services   In-house Referral: Clinical Social Work Discharge Planning Services: CM Consult                                           Social Determinants of Health (SDOH) Interventions Food Insecurity Interventions: Other (Comment) (Pt. reports having food stamps) Financial Strain Interventions: Intervention Not Indicated,Other (Comment) (CSW working on connecting Pt. to resources) Housing Interventions: Other (Comment) (Referral to outpatient residential treatment program for pregnant women) Transportation Interventions: [facility] Alcohol Brief Interventions/Follow-up: Continued Monitoring (Pt. reports that she hasn't had a drink since her pregnancy) Depression Interventions/Treatment : Counseling,Medication (Pt. reports she has been on medication before and needs to get back on medication  and she is open to counseling and meds.)  Readmission Risk Interventions Readmission Risk Prevention Plan 07/30/2018  Transportation Screening Complete  Medication Review Press photographer) Complete  PCP or Specialist appointment within 3-5 days of discharge Complete  HRI or [place] Not Complete  HRI or Home Care Consult Pt Refusal Comments N/A  SW Recovery Care/Counseling Consult Complete  Palliative Care Screening Not Complete  Comments N/A  [place] Not Complete  SNF Comments N/A  Some recent data might be hidden   Hersh Minney, MSW, LCSWA [phone] Heart Failure Social Worker

## 2020-08-24 NOTE — Progress Notes (Signed)
NST reassuring for gestational age.  No obstetrical complaints today.  Dr Roselie Awkward made aware.  Pt upset about possibility of not getting to go home tomorrow.  This will depend on if a cardiac procedure needs to be done before discharge.  Audible fetal movement noted.  Pt still complaining of severe tooth pain.  Dr Roselie Awkward reports he contacted a dentist but they wont see pt until she is d/c.

## 2020-08-25 DIAGNOSIS — R7881 Bacteremia: Secondary | ICD-10-CM | POA: Diagnosis not present

## 2020-08-25 DIAGNOSIS — A4102 Sepsis due to Methicillin resistant Staphylococcus aureus: Secondary | ICD-10-CM | POA: Diagnosis not present

## 2020-08-25 DIAGNOSIS — Z3A32 32 weeks gestation of pregnancy: Secondary | ICD-10-CM

## 2020-08-25 DIAGNOSIS — O98813 Other maternal infectious and parasitic diseases complicating pregnancy, third trimester: Secondary | ICD-10-CM

## 2020-08-25 DIAGNOSIS — A419 Sepsis, unspecified organism: Secondary | ICD-10-CM | POA: Diagnosis not present

## 2020-08-25 DIAGNOSIS — R6520 Severe sepsis without septic shock: Secondary | ICD-10-CM | POA: Diagnosis not present

## 2020-08-25 DIAGNOSIS — Z3A25 25 weeks gestation of pregnancy: Secondary | ICD-10-CM | POA: Diagnosis not present

## 2020-08-25 DIAGNOSIS — N3000 Acute cystitis without hematuria: Secondary | ICD-10-CM | POA: Diagnosis not present

## 2020-08-25 DIAGNOSIS — I50810 Right heart failure, unspecified: Secondary | ICD-10-CM

## 2020-08-25 DIAGNOSIS — O99413 Diseases of the circulatory system complicating pregnancy, third trimester: Secondary | ICD-10-CM | POA: Diagnosis not present

## 2020-08-25 LAB — CBC WITH DIFFERENTIAL/PLATELET
Abs Immature Granulocytes: 0.03 10*3/uL (ref 0.00–0.07)
Basophils Absolute: 0 10*3/uL (ref 0.0–0.1)
Basophils Relative: 0 %
Eosinophils Absolute: 0.1 10*3/uL (ref 0.0–0.5)
Eosinophils Relative: 1 %
HCT: 29.9 % — ABNORMAL LOW (ref 36.0–46.0)
Hemoglobin: 9.5 g/dL — ABNORMAL LOW (ref 12.0–15.0)
Immature Granulocytes: 0 %
Lymphocytes Relative: 26 %
Lymphs Abs: 1.9 10*3/uL (ref 0.7–4.0)
MCH: 30 pg (ref 26.0–34.0)
MCHC: 31.8 g/dL (ref 30.0–36.0)
MCV: 94.3 fL (ref 80.0–100.0)
Monocytes Absolute: 0.4 10*3/uL (ref 0.1–1.0)
Monocytes Relative: 6 %
Neutro Abs: 4.9 10*3/uL (ref 1.7–7.7)
Neutrophils Relative %: 67 %
Platelets: 284 10*3/uL (ref 150–400)
RBC: 3.17 MIL/uL — ABNORMAL LOW (ref 3.87–5.11)
RDW: 17 % — ABNORMAL HIGH (ref 11.5–15.5)
WBC: 7.3 10*3/uL (ref 4.0–10.5)
nRBC: 0 % (ref 0.0–0.2)

## 2020-08-25 LAB — COMPREHENSIVE METABOLIC PANEL
ALT: 10 U/L (ref 0–44)
AST: 16 U/L (ref 15–41)
Albumin: 2.6 g/dL — ABNORMAL LOW (ref 3.5–5.0)
Alkaline Phosphatase: 62 U/L (ref 38–126)
Anion gap: 8 (ref 5–15)
BUN: 16 mg/dL (ref 6–20)
CO2: 26 mmol/L (ref 22–32)
Calcium: 9.2 mg/dL (ref 8.9–10.3)
Chloride: 100 mmol/L (ref 98–111)
Creatinine, Ser: 0.56 mg/dL (ref 0.44–1.00)
GFR, Estimated: >60 mL/min (ref >60)
Glucose, Bld: 88 mg/dL (ref 70–99)
Potassium: 3.9 mmol/L (ref 3.5–5.1)
Sodium: 134 mmol/L — ABNORMAL LOW (ref 135–145)
Total Bilirubin: 0.6 mg/dL (ref 0.3–1.2)
Total Protein: 6.5 g/dL (ref 6.5–8.1)

## 2020-08-25 LAB — PHOSPHORUS: Phosphorus: 4.2 mg/dL (ref 2.5–4.6)

## 2020-08-25 LAB — MAGNESIUM: Magnesium: 1.6 mg/dL — ABNORMAL LOW (ref 1.7–2.4)

## 2020-08-25 MED ORDER — FUROSEMIDE 40 MG PO TABS: 40.0000 mg | ORAL_TABLET | Freq: Every day | ORAL | 1 refills | Status: DC

## 2020-08-25 MED ORDER — COMPLETENATE 29-1 MG PO CHEW: 1.0000 | CHEWABLE_TABLET | Freq: Every day | ORAL | 2 refills | Status: DC

## 2020-08-25 MED ORDER — SERTRALINE HCL 50 MG PO TABS: 50.0000 mg | ORAL_TABLET | Freq: Every day | ORAL | 0 refills | Status: DC

## 2020-08-25 MED ORDER — FUROSEMIDE 40 MG PO TABS: 40.0000 mg | ORAL_TABLET | Freq: Every day | ORAL | 2 refills | Status: DC

## 2020-08-25 MED ORDER — MAGNESIUM SULFATE 50 % IJ SOLN
3.0000 g | Freq: Once | INTRAVENOUS | Status: AC
  Administered 2020-08-25: 3 g via INTRAVENOUS
  Filled 2020-08-25: qty 6

## 2020-08-25 MED ORDER — SERTRALINE HCL 50 MG PO TABS: 50.0000 mg | ORAL_TABLET | Freq: Every day | ORAL | 1 refills | Status: DC

## 2020-08-25 MED ORDER — PROMETHAZINE HCL 25 MG PO TABS: 25.0000 mg | ORAL_TABLET | Freq: Four times a day (QID) | ORAL | 0 refills | Status: DC | PRN

## 2020-08-25 NOTE — Discharge Summary (Addendum)
Physician Discharge Summary  Patient ID: Kelly [name] MRN: [id number] DOB/AGE: 09-13-1985 35 y.o.  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)   Tricuspid regurgitation   Supervision of other high risk pregnancies, third trimester   Preexisting hypertension complicating pregnancy, antepartum   Dental caries   Endocarditis   History of VBAC   Lumbar back pain   Unwanted fertility   Acute on chronic congestive heart failure ([place])   Acute cystitis without hematuria   Cellulitis of right lower extremity   Septic embolism (HCC)   Syphilis   Abdominal pain   Drug use affecting pregnancy in second trimester   Methadone maintenance treatment affecting pregnancy ([place])   Maternal endocarditis complicating pregnancy in third trimester   Pain, dental   History of hepatitis C   Septic pulmonary embolism (HCC)   Severe sepsis (HCC)   Acute on chronic systolic CHF (congestive heart failure) (HCC)   Severe pulmonary hypertension (HCC)   Endocarditis of tricuspid valve   Tricuspid valve regurgitation   Prolonged QT interval   Discharged Condition: stable  Hospital Course:  35 year old female with prior history of former smoker, current IVDA/ polysubstance abuse with heroin/ fentanyl (relapsed 5 months ago, last use 3/2 am), previous TV IE 11/2017 and recurrent 06/2018, epidural abscess (2019), MSSA bacteremia 2019,  HTN, and Bipolar presenting with abdominal  pain and lower extremity edema.   On arrival, noted to be 88% on room air which increased with 2L [place], afebrile, tachycardic 117, and normotensive.  Labs significant for WBC 19.1, Hgb 9.4, Hct 28.5, lactic acid 3.2, Na 129, K 3.2, Cl 94, bicarb 21, alk phos 138, albumin 2.3, BNP 157, CK 68, troponin hs 14-> 19, UA with large leukocytes, positive nitrates, > 50 WBC  positive HCG 12,898 with POCUS ultrasound showing large fetus with heart movement.  Patient was unsure how long she had been pregnant.  Denied any vaginal bleeding, contractions, or fluid loss; no prior prenatal care.  OB consulted and placed on toco monitoring and per OB, patient is 26wk1d gestation and cleared from [facility] standpoint. CXR with low volumes and bibasilar airspace opacities concerning for pneumonia.  She was cultured and started on ceftriaxone and azithromycin.   ID was consulted and her antibiotics were updated to vancomycin and cefazolin. She had PICC line placed for antibiotic 6 week course, and was hospitalized as a poor candidate for outpatient therapy due to drug abuse. She was maintained on methadone and was to continue after discharge. Ob was consulted and she was transferred to [name] service after 30 weeks with medicine primarily managing. US showed normal growth and she passed glucose testing.  Acute on Chronic Systolic CHF with Mostly Right Sided CHF -Likely Secondary to Severe TR -3/14 lower extremity edema, SOB/DOE resolved -Strict in and out -Daily weight -4/13 discussed case with Dr. Haroldine Laws cardiology and from cardiology standpoint patient does not require TEE prior to discharge. -4/13 follow-up with Dr. Haroldine Laws CHF clinic in 3 to 4 weeks post discharge Consults: cardiology, pulmonary/intensive  care and ID  Significant Diagnostic Studies: radiology: Ultrasound: fetal anatomy and TEE, echocardiogram  Treatments: antibiotics: Ancef and vancomycin  Discharge Exam: Blood pressure 107/70, pulse 71, temperature 97.8  F (36.6 C), temperature source Oral, resp. rate 18, height 5' 7.5" (1.715 m), weight 86.5 kg, SpO2 97 %, not currently breastfeeding. General appearance: alert, cooperative and no distress Cardio: RRR Extremities: extremities normal, atraumatic, no cyanosis or edema Skin: Skin color, texture, turgor normal. No rashes or lesions  Disposition: Discharge disposition: 01-Home or Self Care       Discharge Instructions    Discharge patient   Complete by: As directed    Room at the [facility]   Discharge disposition: 01-Home or Self Care   Discharge patient date: 08/25/2020     Allergies as of 08/25/2020      Reactions   Hydrocodone Itching   Morphine And Related Nausea And Vomiting      Medication List    TAKE these medications   furosemide 40 MG tablet Commonly known as: LASIX Take 1 tablet (40 mg total) by mouth daily. Start taking on: August 26, 2020   prenatal vitamin w/FE, FA 29-1 MG Chew chewable tablet Chew 1 tablet by mouth daily at 12 noon.   promethazine 25 MG tablet Commonly known as: PHENERGAN Take 1 tablet (25 mg total) by mouth every 6 (six) hours as needed for nausea or vomiting.   sertraline 50 MG tablet Commonly known as: ZOLOFT Take 1 tablet (50 mg total) by mouth daily. Start taking on: August 26, 2020       Follow-up Information    [place], Darryl, DMD. Schedule an appointment as soon as possible for a visit in 1 week(s).   Specialty: Dentistry Contact information: [place] [address] [phone]        [place] Follow up.   Why: Methadone appointment Friday 08/26/20 [place] will provide transportation to this appointment Contact information: [place] [address] [phone]        [facility] [place] [facility] Follow up in 1 week(s).   Specialty: Obstetrics and Gynecology Contact information: [place]  [phone]             follow-up with Dr. Haroldine Laws CHF clinic in 3 to 4 weeks post discharge  Signed: Emeterio Reeve 08/25/2020, 3:31 PM

## 2020-08-25 NOTE — Consult Note (Signed)
Medical Consultation   Kelly [name]  PNP:[id number]  DOB: [birth year]  DOA: 07/13/2020  PCP: [facility]    Requesting physician: Dr. Emeterio Reeve OB/GYN   Reason for consultation: Multiple medical problems   History of Present Illness: 35 year old WF PMHx Polysubstance abuse including heroin, and fentanyl. She also has bipolar disease and history endocarditis with epidural abscess. Currently she is pregnant and using intravenous drugs.   Reported right upper quadrant abdominal pain for about 2 days which was persistent and severe to the point where she was unable to ambulate.  At the time of consultation blood pressure 120/70, heart rate 111, respiratory rate 24, temperature 99.3, oxygenation 90%, she had diffuse rhonchi bilaterally, heart S1-S2, present, tachycardic, positive systolic murmur, abdomen gravid, no significant lower extremity edema.  Blood cultures were positive for methicillin resistant Staphylococcus 4-4 bottles.  Transthoracic echocardiography no signs of vegetation, transesophageal echocardiogram was deferred. Considering her clinical scenario very high pretest for endocarditis.  She has been placed on empirical therapy with vancomycin plan to complete 4/14 2022.  Patient continue methadone for pain control, has been on hydromorphone for break through pain control. Plan to slowly wean off hydromorphone in preparation for her discharge.  Continue to have left mandible pain.   4/13 afebrile overnight states fractured her left lower molar a couple days ago now causes her pain whenever she attempts to chew food. 4/14 patient continues to have pain from her fractured left lower molar.   Covid vaccination; Review of Systems:  Review of Systems  Constitutional: Negative.   HENT: Negative.   Eyes: Negative.   Respiratory: Negative.   Cardiovascular: Negative.   Gastrointestinal: Negative.   Genitourinary: Negative.    Musculoskeletal: Negative.   Skin: Negative.   Neurological: Negative.   Endo/Heme/Allergies: Negative.   Psychiatric/Behavioral: Positive for substance abuse.      Past Medical History: Past Medical History:  Diagnosis Date  . 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 Abscess of breast    rt breast  . ADD (attention deficit disorder)   . Back pain   . Bacteremia due to Enterococcus 06/15/2018  . Bipolar 1 disorder ([place])   . Endocarditis 2020  . Foreign body granuloma of soft tissue, NEC, right upper arm   . History of hepatitis C 10/01/2018  . Hypertension   . Influenza B 06/17/2018  . Leukocytosis   . Multifocal pneumonia 06/14/2018  . Polysubstance abuse ([employer])   . Pyelonephritis affecting pregnancy 05/24/2016  . Sepsis ([place]) 12/02/2017  . Suicidal overdose [facility])     Past Surgical History: Past Surgical History:  Procedure Laterality Date  . BACK SURGERY     scoliosis-in care everywhere 08/1999  . CESAREAN SECTION    . CHOLECYSTECTOMY  March 2015  . FOREIGN BODY REMOVAL Right 05/28/2016   Procedure: REMOVAL FOREIGN BODY RIGHT ARM;  Surgeon: Meredith Pel, MD;  Location: [place];  Service: Orthopedics;  Laterality: Right;  . IRRIGATION AND DEBRIDEMENT ABSCESS Right 07/13/2014   Procedure: IRRIGATION AND DEBRIDEMEN TRIGHT BREAST ABSCESS;  Surgeon: Donnie Mesa, MD;  Location: [place];  Service: General;  Laterality: Right;  . LUMBAR LAMINECTOMY/DECOMPRESSION MICRODISCECTOMY N/A 12/05/2017   Procedure: LUMBAR LAMINECTOMY FOR EPIDURAL ABSCESS;  Surgeon: Newman Pies, MD;  Location: [place];  Service: Neurosurgery;  Laterality: N/A;  . MULTIPLE EXTRACTIONS WITH ALVEOLOPLASTY Bilateral 07/03/2018   Procedure: Extraction of tooth #'s  30 and 31 with alveoloplasty;  Surgeon: Lenn Cal, DDS;  Location: [place];  Service: Oral Surgery;  Laterality: Bilateral;  . RADIOLOGY WITH ANESTHESIA N/A 12/05/2017   Procedure: MRI WITH ANESTHESIA OF [facility] AND LUMBAR  SPINE WITH AND WITHOUT;  Surgeon: Radiologist, Medication, MD;  Location: [place];  Service: Radiology;  Laterality: N/A;  . RIGHT/LEFT HEART CATH AND CORONARY ANGIOGRAPHY N/A 05/21/2019   Procedure: RIGHT/LEFT HEART CATH AND CORONARY ANGIOGRAPHY;  Surgeon: Jolaine Artist, MD;  Location: [place] CV LAB;  Service: Cardiovascular;  Laterality: N/A;  . TEE WITHOUT CARDIOVERSION N/A 05/21/2019   Procedure: TRANSESOPHAGEAL ECHOCARDIOGRAM (TEE);  Surgeon: Jolaine Artist, MD;  Location: [facility] ENDOSCOPY;  Service: Cardiovascular;  Laterality: N/A;     Allergies:   Allergies  Allergen Reactions  . Hydrocodone Itching  . Morphine And Related Nausea And Vomiting     Social History:  reports that she quit smoking about 2 years ago. Her smoking use included cigarettes. She has a 13.00 pack-year smoking history. She has never used smokeless tobacco. She reports previous alcohol use. She reports current drug use. Drugs: Cocaine, Heroin, IV, and Fentanyl.   Family History: Family History  Problem Relation Age of Onset  . Diabetes Mother   . Diabetes Father   . Hypertension Father   . Heart attack Neg Hx   . Hyperlipidemia Neg Hx   . Sudden death Neg Hx      Consults:  Dr. Haroldine Laws Cardiology/CHF ID Dr. Tommy Medal       Procedures/Significant Events:  3/2 echocardiogram limited;Left Ventricle: LVEF 55%.  -Right Ventricle:severely enlarged. systolic function is moderately reduced.  Severely elevated pulmonary artery systolic pressure. Estimated right ventricular systolic pressure is 33.2 mmHg.  -Right Atrium: severely dilated.  -Tricuspid Valve: regurgitation is severe.      I have personally reviewed and interpreted all radiology studies and my findings are as above.  VENTILATOR SETTINGS:   Cultures 3/2 blood positive MRSA x2 3/2 MRSA by PCR positive 3/2 urine negative 3/4 blood right AC negative 3/4 blood RIGHT forearm negative  Antimicrobials:  Anti-infectives  (From admission, onward)   Start     Ordered Stop   08/19/20 2300  vancomycin (VANCOCIN) 750 mg in sodium chloride 0.9 % 250 mL IVPB        08/19/20 2207 08/25/20 2359   08/19/20 2145  vancomycin (VANCOCIN) 750 mg in sodium chloride 0.9 % 150 mL IVPB  Status:  Discontinued        08/19/20 2053 08/19/20 2207   08/17/20 0400  vancomycin (VANCOCIN) IVPB 750 mg/150 ml premix  Status:  Discontinued        08/16/20 2336 08/19/20 2053   08/16/20 1200  vancomycin (VANCOCIN) 750 mg in sodium chloride 0.9 % 250 mL IVPB  Status:  Discontinued        08/16/20 0448 08/16/20 2335   08/11/20 1600  ceFAZolin (ANCEF) IVPB 1 g/50 mL premix  Status:  Discontinued        08/11/20 1315 08/11/20 1316   07/22/20 1045  amoxicillin-clavulanate (AUGMENTIN) 875-125 MG per tablet 1 tablet        07/22/20 0952 07/31/20 2023   07/18/20 0830  vancomycin (VANCOCIN) IVPB 750 mg/150 ml premix  Status:  Discontinued        07/18/20 0750 08/16/20 0448   07/16/20 0400  Vancomycin (VANCOCIN) 1,250 mg in sodium chloride 0.9 % 250 mL IVPB  Status:  Discontinued        07/15/20  1944 07/18/20 0749   07/15/20 1715  penicillin g benzathine (BICILLIN LA) 1200000 UNIT/2ML injection 2.4 Million Units        07/15/20 1621 07/15/20 1747   07/14/20 0300  vancomycin (VANCOCIN) IVPB 1000 mg/200 mL premix        07/13/20 1806 07/15/20 2059   07/13/20 1730  Vancomycin (VANCOCIN) 1,500 mg in sodium chloride 0.9 % 500 mL IVPB        07/13/20 1709 07/13/20 2116   07/13/20 1730  ceFEPIme (MAXIPIME) 2 g in sodium chloride 0.9 % 100 mL IVPB  Status:  Discontinued        07/13/20 1709 07/14/20 1050   07/13/20 1315  cefTRIAXone (ROCEPHIN) 1 g in sodium chloride 0.9 % 100 mL IVPB        07/13/20 1306 07/13/20 1538   07/13/20 1315  azithromycin (ZITHROMAX) 500 mg in sodium chloride 0.9 % 250 mL IVPB        07/13/20 1306 07/13/20 1644      Devices   LINES / TUBES:      Continuous Infusions: . vancomycin (VANCOCIN) 750 mg/260m IVPB 750 mg  (08/25/20 1346)     Physical Exam: Vitals:   08/24/20 0626 08/24/20 1819 08/25/20 0018 08/25/20 0603  BP: 106/71 100/65 103/66 107/70  Pulse: 71 61 73 71  Resp: _0 Temp: 97.7 F (36.5 C) 98 F (36.7 C) 97.8 F (36.6 C) 97.8 F (36.6 C)  TempSrc: Oral Oral Oral Oral  SpO2: 98% 98% 97% 97%  Weight: 87.3 kg   86.5 kg  Height:        General: A/O x4, No acute respiratory distress, unable to consume appropriate amount of food secondary to broken lower left molar Eyes: negative scleral hemorrhage, negative anisocoria, negative icterus ENT: Negative Runny nose, negative gingival bleeding, extremely poor dentation, broken lower left molar Neck:  Negative scars, masses, torticollis, lymphadenopathy, JVD Lungs: Clear to auscultation bilaterally without wheezes or crackles Cardiovascular: Regular rate and rhythm without murmur gallop or rub normal S1 and S2 Abdomen: negative abdominal pain, appropriately distended for pregnancy, positive soft, bowel sounds, no rebound, no ascites, no appreciable mass Extremities: No significant cyanosis, clubbing, or edema bilateral lower extremities Skin: Negative rashes, lesions, ulcers Psychiatric:  Negative depression, negative anxiety, negative fatigue, negative mania  Central nervous system:  Cranial nerves II through XII intact, tongue/uvula midline, all extremities muscle strength 5/5, sensation intact throughout, negative dysarthria, negative expressive aphasia, negative receptive aphasia.  Data reviewed:  I have personally reviewed following labs and imaging studies Labs:  CBC: Recent Labs  Lab 08/24/20 0303 08/25/20 0348  WBC 6.8 7.3  NEUTROABS  --  4.9  HGB 9.3* 9.5*  HCT 29.5* 29.9*  MCV 95.2 94.3  PLT 286 2222   Basic Metabolic Panel: Recent Labs  Lab 08/20/20 0327 08/24/20 0303 08/25/20 0348  NA 134* 132* 134*  K 3.6 3.9 3.9  CL 101 100 100  CO2 _1 GLUCOSE 103* 87 88  BUN _2 CREATININE 0.57 0.51  0.56  CALCIUM 9.1 9.1 9.2  MG  --   --  1.6*  PHOS  --   --  4.2   GFR Estimated Creatinine Clearance: 112 mL/min (by C-G formula based on SCr of 0.56 mg/dL). Liver Function Tests: Recent Labs  Lab 08/25/20 0348  AST 16  ALT 10  ALKPHOS 62  BILITOT 0.6  PROT 6.5  ALBUMIN 2.6*   No results for  input(s): LIPASE, AMYLASE in the last 168 hours. No results for input(s): AMMONIA in the last 168 hours. Coagulation profile No results for input(s): INR, PROTIME in the last 168 hours.  Cardiac Enzymes: No results for input(s): CKTOTAL, CKMB, CKMBINDEX, TROPONINI in the last 168 hours. BNP: Invalid input(s): POCBNP CBG: No results for input(s): GLUCAP in the last 168 hours. D-Dimer No results for input(s): DDIMER in the last 72 hours. Hgb A1c No results for input(s): HGBA1C in the last 72 hours. Lipid Profile No results for input(s): CHOL, HDL, LDLCALC, TRIG, CHOLHDL, LDLDIRECT in the last 72 hours. Thyroid function studies No results for input(s): TSH, T4TOTAL, T3FREE, THYROIDAB in the last 72 hours.  Invalid input(s): FREET3 Anemia work up No results for input(s): VITAMINB12, FOLATE, FERRITIN, TIBC, IRON, RETICCTPCT in the last 72 hours. Urinalysis    Component Value Date/Time   COLORURINE AMBER (A) 07/13/2020 1320   APPEARANCEUR CLOUDY (A) 07/13/2020 1320   APPEARANCEUR Hazy 07/10/2013 1121   LABSPEC 1.028 07/13/2020 1320   LABSPEC 1.020 07/10/2013 1121   PHURINE 5.0 07/13/2020 1320   GLUCOSEU NEGATIVE 07/13/2020 1320   GLUCOSEU Negative 07/10/2013 1121   HGBUR SMALL (A) 07/13/2020 1320   BILIRUBINUR NEGATIVE 07/13/2020 1320   BILIRUBINUR Negative 07/10/2013 1121   KETONESUR 5 (A) 07/13/2020 1320   PROTEINUR 100 (A) 07/13/2020 1320   UROBILINOGEN 1.0 02/15/2015 0530   NITRITE POSITIVE (A) 07/13/2020 1320   LEUKOCYTESUR LARGE (A) 07/13/2020 1320   LEUKOCYTESUR Trace 07/10/2013 1121     Microbiology No results found for this or any previous visit (from the past  240 hour(s)).     Inpatient Medications:   Scheduled Meds: . sodium chloride   Intravenous Once  . acetaminophen  650 mg Oral Q6H  . Chlorhexidine Gluconate Cloth  6 each Topical Daily  . enoxaparin (LOVENOX) injection  40 mg Subcutaneous Q24H  . feeding supplement  237 mL Oral BID BM  . furosemide  40 mg Oral Daily  . methadone  120 mg Oral Daily  . potassium chloride  20 mEq Oral Daily  . prenatal vitamin w/FE, FA  1 tablet Oral Q1200  . senna-docusate  1 tablet Oral BID  . sertraline  50 mg Oral Daily  . sodium chloride flush  10-40 mL Intracatheter Q12H   Continuous Infusions: . vancomycin (VANCOCIN) 750 mg/293m IVPB 750 mg (08/25/20 1346)     Radiological Exams on Admission: [place]FM FETAL BPP WO NON STRESS  Result Date: 08/24/2020 ----------------------------------------------------------------------  OBSTETRICS REPORT                       (Signed Final 08/24/2020 10:22 am) ---------------------------------------------------------------------- Patient Info  ID #:       [id number]                         D.O.B.:  [birth year](34 yrs)  Name:       [name]             Visit Date: 08/24/2020 08:31 am ---------------------------------------------------------------------- Performed By  Attending:        CSander Nephew     Ref. Address:     [address]  Performed By:     Valda Favia          [facility]                    [place]  Referred By:      Sloan Leiter                    MD ---------------------------------------------------------------------- Orders  #  Description                           Code        Ordered By  1  [place] MFM FETAL BPP WO NON               51884.16    Kelly [name]     STRESS ----------------------------------------------------------------------  #  Order #                     Accession #                 Episode #  1  [id number]                   [id number]                 [id number] ---------------------------------------------------------------------- Indications  [redacted] weeks gestation of pregnancy                C3J.62  Drug use complicating pregnancy, third         O99.323  trimester  Hypertension - Chronic/Pre-existing            O10.019  Heart disease in mother affecting pregnancy    O99.413  in third trimester  Advanced maternal age multigravida 15+,        O31.523  third trimester  Previous cesarean delivery, antepartum x1      O34.219  Heart disease in mother affecting pregnancy    O99.413  in third trimester  Encounter for other antenatal screening        Z36.2  follow-up ---------------------------------------------------------------------- Fetal Evaluation  Num Of Fetuses:         1  Fetal Heart Rate(bpm):  133  Cardiac Activity:       Observed  Presentation:           Transverse, head to maternal right  Placenta:               Anterior  P. Cord Insertion:      Previously Visualized  Amniotic Fluid  AFI FV:      Within normal limits  AFI Sum(cm)     %Tile       Largest Pocket(cm)  20.5            78          6.9  RUQ(cm)       RLQ(cm)       LUQ(cm)        LLQ(cm)  4.5           5.9           3.2  6.9 ---------------------------------------------------------------------- Biophysical Evaluation  Amniotic F.V:   Within normal limits       F. Tone:        Observed  F. Movement:    Observed                   Score:          8/8  F. Breathing:   Observed ---------------------------------------------------------------------- OB History  Gravidity:    7         Term:   3        Prem:   0        SAB:   3  TOP:          0       Ectopic:  0        Living: 3 ---------------------------------------------------------------------- Gestational Age  Best:          32w 1d     Det. By:  U/S  (07/13/20)          EDD:   10/18/20 ---------------------------------------------------------------------- Anatomy   Thoracic:              Appears normal         Bladder:                Appears normal  Stomach:               Appears normal, left                         sided ---------------------------------------------------------------------- Impression  Antenatal testing performed given maternal chronic  hypertension  The biophysical profile was 8/8 with good fetal movement and  amniotic fluid volume. ---------------------------------------------------------------------- Recommendations  Clinical correlation recommended. ----------------------------------------------------------------------               Sander Nephew, MD Electronically Signed Final Report   08/24/2020 10:22 am ----------------------------------------------------------------------   Impression/Recommendations Principal Problem:   MRSA bacteremia Active Problems:   Thoracic back pain   Opioid use disorder, severe, dependence (HCC)   Cocaine abuse ([place])   Polysubstance abuse ([place])   No prenatal care in current pregnancy in third trimester   Chronic hypertension during pregnancy, antepartum   Community acquired pneumonia   IVDU (intravenous drug user)   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)   Tricuspid regurgitation   Supervision of other high risk pregnancies, third trimester   Preexisting hypertension complicating pregnancy, antepartum   Dental caries   Endocarditis   History of VBAC   Lumbar back pain   Unwanted fertility   Acute on chronic congestive heart failure ([place])   Acute cystitis without hematuria   Cellulitis of right lower extremity   Septic embolism (HCC)   Syphilis   Abdominal pain   Drug use affecting pregnancy in second trimester   Methadone maintenance treatment affecting pregnancy ([place])   Maternal endocarditis complicating pregnancy in third trimester   Pain, dental   History of hepatitis C   Septic pulmonary embolism (HCC)   Severe sepsis (HCC)   Acute on chronic  systolic CHF (congestive heart failure) (HCC)   Severe pulmonary hypertension (HCC)   Endocarditis of tricuspid valve   Tricuspid valve regurgitation   Prolonged QT interval  MRSA bacteremia/endocarditis/septic pulmonary embolism -Continue vancomycin per pharmacy protocol.  Last dose vancomycin on 4/14 -4/14 completed course of antibiotics  Severe Sepsis Secondary to UTI and Possible Pneumonia -Upon admission patient met criteria for severe sepsis,  WBC> 12, Lactic acid= 3.2 -Site of infection MRSA bacteremia -Initially managed by PCCM - Sepsis pathophysiology resolved  Acute on Chronic Systolic CHF with Mostly Right Sided CHF -Likely Secondary to Severe TR -3/14 lower extremity edema, SOB/DOE resolved -Strict in and out -Daily weight -4/13 discussed case with Dr. Haroldine Laws cardiology and from cardiology standpoint patient does not require TEE prior to discharge. -4/13 follow-up with Dr. Haroldine Laws CHF clinic in 3 to 4 weeks post discharge  Severe pulmonary hypertension -See CHF  Recurrent Tricuspid Endocarditis with Severe TR - Recurrent endocarditis of tricuspid valve in 11/2017 and again in 06/2018 secondary to IV drug use. She was seen by Dr. Servando Snare in 05/2019 for possible surgical repair. However, patient was lost to follow-up and unfortunately started using drugs again. - Echo this admission shows severe TR but no signs of vegetation.  Prolonged QTc - QTc on 636 ms. -Avoid QT prolonging medication -4/13 repeat EKG  Anemia of chronic disease -Last hgb and hct on 04/06 8,8 and 26,9 respectively. Lab Results  Component Value Date   HGB 9.5 (L) 08/25/2020   HGB 9.3 (L) 08/24/2020   HGB 8.8 (L) 08/17/2020   HGB 8.9 (L) 08/15/2020   HGB 8.3 (L) 08/13/2020  -Stable  Hepatitis C/syphilis -Sp penicillin 2,4 MU IM, continue follow up as outpatient.  - Patient will need to schedule follow-up with ID as outpatient after birth of child  Substance abuse -3/2 UDS positive  amphetamines, cocaine - Patient reports using mostly Fentanyl (at least 3 grams per day) but also notes using Heroin and cocaine. -Upon discharge patient will enter into a drug rehab program on 4/15.  Initially will be discharged to  [facility] per LCSW where they will monitor her for drug abuse  LEFT lower mandible teeth damage/Back pain. -Patient fractured her left lower molar: Down to the gumline. -Spoke with DDS Sandi Mariscal, who is familiar with patient and states that patient really needs to have all of her teeth removed.  However given that she is pregnant would not do it emergently.  She is going to look into case and recommend an oral surgeon.  Will await recommendation on who patient needs to see for tooth extraction. -Pain management per OB/GYN (primary team) - 4/14 have conversed throughout the day with [place],  in order to arrange for patient to have emergent outpatient extraction of her damaged tooth. - 4/14 prior to discharge [place] will assist patient in obtaining appointment given that she must report to the [facility], today and on Friday must report to Methadone clinic.  Poor dentation -See LEFT lower mandible teeth damage  Depression -Zoloft 50 mg daily  Pregnancy -Care per primary team (OB/GYN)        Thank you for this consultation.  [facility] hospitalist team will sign off.   Time Spent: 60 minutes  [place]wa Boulay, Geraldo Docker M.D. [facility] 08/25/2020, 3:11 PM

## 2020-08-25 NOTE — Discharge Instructions (Signed)
Endocarditis  Endocarditis is an infection of the heart valves or an infection of the inner layer of the heart (endocardium). Endocarditis can cause growths on the heart valves or inside the heart. Over time, these growths can destroy heart tissue and cause heart failure or problems with the heart rhythm. They can also cause a stroke if they break away and block an artery in the brain. Early treatment offers the best chance for curing endocarditis and preventing complications. What are the causes? This condition may be caused by:  Germs that normally live in or on your body. The germs that most commonly cause endocarditis are bacteria.  Fungus.  Cancer.  Connective tissue disease. What increases the risk? This condition is more likely to develop in people who have:  A heart defect.  Artificial (prosthetic) heart valves.  An abnormal or damaged heart valve.  A history of endocarditis.  IV drug abuse. Having certain procedures may also increase the risk of germs getting into the heart or bloodstream. What are the signs or symptoms? Symptoms of this condition may start suddenly, or they may start slowly and gradually get worse. Symptoms include:  Fever.  Chills.  Night sweats.  Muscle aches.  Fatigue.  Weakness.  Shortness of breath.  Chest pain.  Blood spots in the eyes.  Bleeding under the fingernails or toenails.  Painless red spots on the palms.  Painful lumps in the fingertips or toes.  Swelling in the feet or ankles. How is this diagnosed? This condition may be diagnosed based on:  A physical exam. Your health care provider will listen to your heart to check for abnormal heart sounds (murmur). He or she may also use a scope to check for bleeding at the back of your eyes (retinas).  Tests. They may include: ? Blood tests to look for the germs that cause endocarditis. ? Imaging tests. These include a chest X-ray, CT scan, or echocardiogram. A type of  echocardiogram called a transesophageal echocardiogram may be done to look at heart valves more closely. How is this treated? Treatment for this condition depends on the cause of the endocarditis. Treatment may include:  Antibiotic medicines. These may be given through an IV line or taken by mouth. You may need to be on more than one antibiotic medicine.  Surgery to replace your heart valve. You may need surgery if: ? The endocarditis does not respond to treatment. ? You develop complications. ? Your heart valve is severely damaged. Follow these instructions at home: Medicines  Take over-the-counter and prescription medicines only as told by your health care provider.  If you were prescribed an antibiotic medicine, take it as told by your health care provider. Do not stop taking the antibiotic even if you start to feel better. You may need to be on intravenous or oral antibiotics for several weeks.  Do not use IV drugs unless it is part of your medical treatment. Lifestyle  Do not get tattoos or body piercings.  Practice good oral hygiene. This includes: ? Brushing and flossing regularly. ? Scheduling routine dental appointments.  Do not use any products that contain nicotine or tobacco, such as cigarettes, e-cigarettes, and chewing tobacco. If you need help quitting, ask your health care provider.  If you drink alcohol: ? Limit how much you use to:  0-1 drink a day for women.  0-2 drinks a day for men. ? Be aware of how much alcohol is in your drink. In the [place] one drink equals one  typical bottle of beer (12 oz), one-half glass of wine (5 oz), or one shot of hard liquor (1 oz). General instructions  Let your health care provider know before you have any dental or surgical procedures. You may need to take antibiotics before the procedure.  Tell all of your health care providers, including your dentist, that you have had endocarditis.  Gradually resume your usual  activities.  Keep all follow-up visits as told by your health care provider. This is important. Contact a health care provider if:  You have a fever.  Your symptoms do not improve.  Your symptoms get worse.  Your symptoms come back. Get help right away if:  You have trouble breathing.  You have chest pain.  You have any symptoms of a stroke. "BE FAST" is an easy way to remember the main warning signs of a stroke: ? B - Balance. Signs are dizziness, sudden trouble walking, or loss of balance. ? E - Eyes. Signs are trouble seeing or a sudden change in vision. ? F - Face. Signs are sudden weakness or numbness of the face, or the face or eyelid drooping on one side. ? A - Arms. Signs are weakness or numbness in an arm. This happens suddenly and usually on one side of the body. ? S - Speech. Signs are sudden trouble speaking, slurred speech, or trouble understanding what people say. ? T - Time. Time to call emergency services. Write down what time symptoms started.  You have other signs of a stroke, such as: ? A sudden, severe headache with no known cause. ? Nausea or vomiting. ? Seizure. These symptoms may represent a serious problem that is an emergency. Do not wait to see if the symptoms will go away. Get medical help right away. Call your local emergency services (911 in the [place] Do not drive yourself to the hospital. Summary  Endocarditis is an infection of the heart valves or inner layer of the heart (endocardium). It is caused by bacteria or a fungus.  Having certain heart conditions or procedures may increase the risk of endocarditis.  Antibiotics are an important treatment for endocarditis. Take these medicines as told by your health care provider. Do not stop taking them even if you start to feel better.  Tell all of your health care providers, including your dentist, that you have had endocarditis. This information is not intended to replace advice given to you by your  health care provider. Make sure you discuss any questions you have with your health care provider. Document Revised: 12/16/2017 Document Reviewed: 12/16/2017 Elsevier Patient Education  2021 [place] of Pregnancy  The third trimester of pregnancy is from week 28 through week 2. This is also called months 7 through 9. This trimester is when your unborn baby (fetus) is growing very fast. At the end of the ninth month, the unborn baby is about 20 inches long. It weighs about 6-10 pounds. Body changes during your third trimester Your body continues to go through many changes during this time. The changes vary and generally return to normal after the baby is born. Physical changes  Your weight will continue to increase. You may gain 25-35 pounds (11-16 kg) by the end of the pregnancy. If you are underweight, you may gain 28-40 lb (about 13-18 kg). If you are overweight, you may gain 15-25 lb (about 7-11 kg).  You may start to get stretch marks on your hips, belly (abdomen), and breasts.  Your breasts  will continue to grow and may hurt. A yellow fluid (colostrum) may leak from your breasts. This is the first milk you are making for your baby.  You may have changes in your hair.  Your belly button may stick out.  You may have more swelling in your hands, face, or ankles. Health changes  You may have heartburn.  You may have trouble pooping (constipation).  You may get hemorrhoids. These are swollen veins in the butt that can itch or get painful.  You may have swollen veins (varicose veins) in your legs.  You may have more body aches in the pelvis, back, or thighs.  You may have more tingling or numbness in your hands, arms, and legs. The skin on your belly may also feel numb.  You may feel short of breath as your womb (uterus) gets bigger. Other changes  You may pee (urinate) more often.  You may have more problems sleeping.  You may notice the unborn baby  "dropping," or moving lower in your belly.  You may have more discharge coming from your vagina.  Your joints may feel loose, and you may have pain around your pelvic bone. Follow these instructions at home: Medicines  Take over-the-counter and prescription medicines only as told by your doctor. Some medicines are not safe during pregnancy.  Take a prenatal vitamin that contains at least 600 micrograms (mcg) of folic acid. Eating and drinking  Eat healthy meals that include: ? Fresh fruits and vegetables. ? Whole grains. ? Good sources of protein, such as meat, eggs, or tofu. ? Low-fat dairy products.  Avoid raw meat and unpasteurized juice, milk, and cheese. These carry germs that can harm you and your baby.  Eat 4 or 5 small meals rather than 3 large meals a day.  You may need to take these actions to prevent or treat trouble pooping: ? Drink enough fluids to keep your pee (urine) pale yellow. ? Eat foods that are high in fiber. These include beans, whole grains, and fresh fruits and vegetables. ? Limit foods that are high in fat and sugar. These include fried or sweet foods. Activity  Exercise only as told by your doctor. Stop exercising if you start to have cramps in your womb.  Avoid heavy lifting.  Do not exercise if it is too hot or too humid, or if you are in a place of great height (high altitude).  If you choose to, you may have sex unless your doctor tells you not to. Relieving pain and discomfort  Take breaks often, and rest with your legs raised (elevated) if you have leg cramps or low back pain.  Take warm water baths (sitz baths) to soothe pain or discomfort caused by hemorrhoids. Use hemorrhoid cream if your doctor approves.  Wear a good support bra if your breasts are tender.  If you develop bulging, swollen veins in your legs: ? Wear support hose as told by your doctor. ? Raise your feet for 15 minutes, 3-4 times a day. ? Limit salt in your  food. Safety  Talk to your doctor before traveling far distances.  Do not use hot tubs, steam rooms, or saunas.  Wear your seat belt at all times when you are in a car.  Talk with your doctor if someone is hurting you or yelling at you a lot. Preparing for your baby's arrival To prepare for the arrival of your baby:  Take prenatal classes.  Visit the hospital and tour the maternity  area.  Buy a rear-facing car seat. Learn how to install it in your car.  Prepare the baby's room. Take out all pillows and stuffed animals from the baby's crib. General instructions  Avoid cat litter boxes and soil used by cats. These carry germs that can cause harm to the baby and can cause a loss of your baby by miscarriage or stillbirth.  Do not douche or use tampons. Do not use scented sanitary pads.  Do not smoke or use any products that contain nicotine or tobacco. If you need help quitting, ask your doctor.  Do not drink alcohol.  Do not use herbal medicines, illegal drugs, or medicines that were not approved by your doctor. Chemicals in these products can affect your baby.  Keep all follow-up visits. This is important. Where to find more information  American Pregnancy Association: americanpregnancy.org  [employer] of Obstetricians and Gynecologists: www.acog.org  Office on Women's Health: KeywordPortfolios.com.br Contact a doctor if:  You have a fever.  You have mild cramps or pressure in your lower belly.  You have a nagging pain in your belly area.  You vomit, or you have watery poop (diarrhea).  You have bad-smelling fluid coming from your vagina.  You have pain when you pee, or your pee smells bad.  You have a headache that does not go away when you take medicine.  You have changes in how you see, or you see spots in front of your eyes. Get help right away if:  Your water breaks.  You have regular contractions that are less than 5 minutes apart.  You are  spotting or bleeding from your vagina.  You have very bad belly cramps or pain.  You have trouble breathing.  You have chest pain.  You faint.  You have not felt the baby move for the amount of time told by your doctor.  You have new or increased pain, swelling, or redness in an arm or leg. Summary  The third trimester is from week 28 through week 40 (months 7 through 9). This is the time when your unborn baby is growing very fast.  During this time, your discomfort may increase as you gain weight and as your baby grows.  Get ready for your baby to arrive by taking prenatal classes, buying a rear-facing car seat, and preparing the baby's room.  Get help right away if you are bleeding from your vagina, you have chest pain and trouble breathing, or you have not felt the baby move for the amount of time told by your doctor. This information is not intended to replace advice given to you by your health care provider. Make sure you discuss any questions you have with your health care provider. Document Revised: 10/07/2019 Document Reviewed: 08/13/2019 Elsevier Patient Education  2021 [employer].

## 2020-08-25 NOTE — TOC Transition Note (Addendum)
Transition of Care [facility]) - CM/SW Discharge Note Heart Failure   Patient Details  Name: NOUR RODRIGUES MRN: [id number] Date of Birth: 10/11/1985  [facility]) CM/SW Contact:  [place], [place] Phone Number: 08/25/2020, 4:15 PM   Clinical Narrative:    CSW provided motivational interviewing with patient and encouraged patient that she has ownership of her situation and the outcome.   CSW outreached [facility] [place] to confirm transportation and they will arrive at [place] will sign off for now as social work intervention is no longer needed. Please consult [place] again if new needs arise.   Final next level of care: Home/Self Care Barriers to Discharge: No Barriers Identified   Patient Goals and CMS Choice Patient states their goals for this hospitalization and ongoing recovery are:: to go to the [facility]      Discharge Placement                Patient to be transferred to facility by: [facility] [place] transportation   Patient and family notified of of transfer: 08/25/20  Discharge Plan and Services In-house Referral: Clinical Social Work Discharge Planning Services: [employer] Consult                                 Social Determinants of Health (SDOH) Interventions Food Insecurity Interventions: Other (Comment) (Pt. reports having food stamps) Financial Strain Interventions: Intervention Not Indicated,Other (Comment) (CSW working on connecting Pt. to resources) Housing Interventions: Other (Comment) (Referral to outpatient residential treatment program for pregnant women) Transportation Interventions: [facility] Alcohol Brief Interventions/Follow-up: Continued Monitoring (Pt. reports that she hasn't had a drink since her pregnancy) Depression Interventions/Treatment : Counseling,Medication (Pt. reports she has been on medication before and needs to get back on medication and she is open to counseling and  meds.)   Readmission Risk Interventions Readmission Risk Prevention Plan 08/25/2020 07/30/2018  Transportation Screening Complete Complete  Transportation Screening Comment [facility] [place] to provide transportation for the patient. -  PCP or Specialist Appt within 5-7 Days Complete -  Not Complete comments 08/29/20 OB/GYN appointment scheduled -  Home Care Screening Complete -  Medication Review ([place]) - Complete  PCP or Specialist appointment within 3-5 days of discharge - Complete  HRI or [place] - Not Complete  HRI or Home Care Consult Pt Refusal Comments - N/A  SW Recovery Care/Counseling Consult - Complete  Palliative Care Screening - Not Complete  Comments - N/A  Skilled Nursing Facility - Not Complete  SNF Comments - N/A  Some recent data might be hidden    Kimoni Pagliarulo, MSW, LCSWA [phone] Heart Failure Social Worker

## 2020-08-25 NOTE — TOC Progression Note (Addendum)
Transition of Care (TOC) - Progression Note  Heart Failure   Patient Details  Name: Kelly [name] MRN: [id number] Date of Birth: Aug 30, 1985  Transition of Care [name]) CM/SW [place], [place] Phone Number: 08/25/2020, 11:59 AM  Clinical Narrative:    CSW visited patient at bedside to verify patients discharge today. Patient reports they are trying to get her to see a dentist before she leaves. CSW spoke with patients nurse and reitorated that [name] needs to discharge today to be able to get to her methadone appointment tomorrow and if she isn't discharged today we would need to keep her through the weekend because the methadone clinic isn't open over the weekend and we would have to get her another appointment. CSW messaged [name] attending doctor and OB/GYN doctor yesterday to verify patients discharge however nobody seemed to have many answers. CSW reached out to HF provider about the patient having an HF follow up appointment at the clinic and they reported they would get one scheduled for the patient.   2:09pm: CSW provided [name] with an appointment card for the [facility] outpatient clinic and encouraged her to follow up and to attend the appointment and bring her medications and if anything changes to please reach out so that CSW/HV clinic team can provide support. CSW spoke with patient about the situation with seeing a dentist before discharge. [name] said that they called her and can't see her until 09/12/20. Patient reported being frustrated with today and everyone knowing that today is her date of discharge and all this taking place last minute. CSW spoke with Jinny Blossom [phone] at [facility] to coordinate transportation and informed her that [name] will hopefully be ready to discharge by 4pm today and will keep her updated if things change.  CSW will continue to follow for d/c needs.      Barriers to Discharge: Continued Medical Work  up,Homeless with medical needs,Unsafe home situation,Financial Resources,Active Substance Use - Placement  Expected Discharge Plan and Services   In-house Referral: Clinical Social Work Discharge Planning Services: CM Consult     Expected Discharge Date: 08/25/20                                     Social Determinants of Health (SDOH) Interventions Food Insecurity Interventions: Other (Comment) (Pt. reports having food stamps) Financial Strain Interventions: Intervention Not Indicated,Other (Comment) (CSW working on connecting Pt. to resources) Housing Interventions: Other (Comment) (Referral to outpatient residential treatment program for pregnant women) Transportation Interventions: [facility] Alcohol Brief Interventions/Follow-up: Continued Monitoring (Pt. reports that she hasn't had a drink since her pregnancy) Depression Interventions/Treatment : Counseling,Medication (Pt. reports she has been on medication before and needs to get back on medication and she is open to counseling and meds.)  Readmission Risk Interventions Readmission Risk Prevention Plan 07/30/2018  Transportation Screening Complete  Medication Review [employer] photographer) Complete  PCP or Specialist appointment within 3-5 days of discharge Complete  HRI or [place] Not Complete  HRI or Home Care Consult Pt Refusal Comments N/A  SW Recovery Care/Counseling Consult Complete  Palliative Care Screening Not Complete  Comments N/A  [place] Not Complete  SNF Comments N/A  Some recent data might be hidden   Kymere Fullington, MSW, LCSWA [phone] Heart Failure Social Worker

## 2020-08-25 NOTE — Progress Notes (Signed)
Pt understands d/c instructions and has all belongings. Pt's PICC line removed by IV team.

## 2020-08-25 NOTE — Progress Notes (Signed)
Pharmacy Antibiotic Note  Kelly [name] is a 35 y.o. female with MRSA bacteremia  And TV endocarditis. 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  Pharmacy has been consulted for vancomycin. Patient is pregnant with EGA of [redacted] weeks (4/12).  Scr remains stable at 0.56 (CrCl >100 mL/min). Remains afebrile. Planning 6 weeks of inpatient IV antibiotics. Patient clinically improving. Most recent vancomycin levels 13 on 4/7 a little on the lower side but AUC has been at goal 400s with this same trough and renal function.   Wbc wnl, afebrile   Plan: Continue vancomycin to 750 mg IV q 8 hrs. Spoke to Dr. Sherral Hammers, Vancomycin to stop today.   Temp (24hrs), Avg:97.9 F (36.6 C), Min:97.8 F (36.6 C), Max:98 F (36.7 C)  Recent Labs  Lab 08/20/20 0327 08/24/20 0303 08/25/20 0348  WBC  --  6.8 7.3  CREATININE 0.57 0.51 0.56    Estimated Creatinine Clearance: 112 mL/min (by C-G formula based on SCr of 0.56 mg/dL).    Allergies  Allergen Reactions  . Hydrocodone Itching  . Morphine And Related Nausea And Vomiting    Antimicrobials this admission: Azith 3/2 x1 CTX 3/2 x1 Vancomycin 3/2> (4/14) proposed stop date  Cefepime 3/2> 3/3  Cultures: 3/2 urine - ng 3/2 Bld - GPC - MRSA 3/2 MRSA PCR pos 3/4 BCx x 2 > Neg   [place] Crane, Roylene Reason, [facility] Clinical Pharmacist  08/25/2020 11:41 AM   [facility] pharmacy phone numbers are listed on [email or web address]

## 2020-08-25 NOTE — Progress Notes (Signed)
NST reactive and reassuring.  Pt has no obstretical complaints today.  Audible fetal movement noted. Dr Roselie Awkward made aware.  D/C may not happen today.

## 2020-08-26 ENCOUNTER — Other Ambulatory Visit (HOSPITAL_COMMUNITY): Payer: Self-pay

## 2020-08-26 NOTE — TOC Transition Note (Signed)
Transition of Care [facility]) - CM/SW Discharge Note Heart Failure   Patient Details  Name: Kelly [name] MRN: [id number] Date of Birth: Jan 20, 1986  Transition of Care [facility]) CM/SW Contact:  [place], [place] Phone Number: 08/26/2020, 4:29 PM   Clinical Narrative:    Patient reached out to CSW at 6:48am stating that she was at [name] for her methadone appointment and they were giving her a hard time because of her Acute on chronic congestive heart failure ([place]) and they were trying to talk with the hospital before administering her methadone. Patient reported she gave the clinic the social workers phone number for them to reach out if needed. Patient asked for her Medicaid number and CSW provided her with her medicaid number.  CSW received another call from [facility] at 12:25pm indicating that the methadone clinic is able to see her regularly. CSW encouraged the patient that if she has any trouble to call the social worker as needed as things arise and between CSW and the outpatient [place] social worker we will make sure she gets support.  CSW will sign off for now as social work intervention is no longer needed. Please consult [place] again if new needs arise.   Final next level of care: Home/Self Care Barriers to Discharge: No Barriers Identified   Patient Goals and CMS Choice Patient states their goals for this hospitalization and ongoing recovery are:: to go to the [facility]      Discharge Placement                Patient to be transferred to facility by: [facility] [place] transportation   Patient and family notified of of transfer: 08/25/20  Discharge Plan and Services In-house Referral: Clinical Social Work Discharge Planning Services: [employer] Consult                                 Social Determinants of Health (SDOH) Interventions Food Insecurity Interventions: Other (Comment) (Pt. reports having food stamps) Financial Strain Interventions:  Intervention Not Indicated,Other (Comment) (CSW working on connecting Pt. to resources) Housing Interventions: Other (Comment) (Referral to outpatient residential treatment program for pregnant women) Transportation Interventions: [facility] Alcohol Brief Interventions/Follow-up: Continued Monitoring (Pt. reports that she hasn't had a drink since her pregnancy) Depression Interventions/Treatment : Counseling,Medication (Pt. reports she has been on medication before and needs to get back on medication and she is open to counseling and meds.)   Readmission Risk Interventions Readmission Risk Prevention Plan 08/25/2020 07/30/2018  Transportation Screening Complete Complete  Transportation Screening Comment [facility] [place] to provide transportation for the patient. -  PCP or Specialist Appt within 5-7 Days Complete -  Not Complete comments 08/29/20 OB/GYN appointment scheduled -  Home Care Screening Complete -  Medication Review ([place]) - Complete  PCP or Specialist appointment within 3-5 days of discharge - Complete  HRI or [place] - Not Complete  HRI or Home Care Consult Pt Refusal Comments - N/A  SW Recovery Care/Counseling Consult - Complete  Palliative Care Screening - Not Complete  Comments - N/A  Skilled Nursing Facility - Not Complete  SNF Comments - N/A  Some recent data might be hidden    Dacotah Cabello, MSW, LCSWA [phone] Heart Failure Social Worker

## 2020-08-29 ENCOUNTER — Encounter: Payer: Medicaid Other | Admitting: Family Medicine

## 2020-08-31 ENCOUNTER — Telehealth: Payer: Self-pay | Admitting: Lactation Services

## 2020-08-31 ENCOUNTER — Telehealth: Payer: Self-pay | Admitting: *Deleted

## 2020-08-31 NOTE — Telephone Encounter (Signed)
Patient called and LM on nurse voicemail that she is experiencing Nausea and vomiting for 2 days. The diarrhea is uncontrollable and she is soiling herself. She is unable ot keep food down, she is able to drink very small amounts of Gatorade. She is feeling very weak.   She has not been seen at [facility] and has her first appointment on 4/28.   Spoke with Dr. Kennon Rounds. She would like to make sure she is taking her Methadone regularly.   Patient reports she has been taking the Methodone regularly and has not messed a dosage. They increased her dosage today to 130 mg this morning. She reports the diarrhea is watery and she is very sore. The vomiting has subsided. The diarrhea is worse at night.   She reports some green snot like drainage from her bottom. Discharge from vagina is thick, white and yellowish. She did feel like she was having contractions last night and was having a lot of lower abdominal pressure, Pressure is still there, contractions are not there.   She reports she is taking a few bites of food a day. Reviewed BRAT diet and encouraged her to try today.   Baby is moving well per patient. She is able to void, she reports she is voiding small amounts when she has diarrhea, which is often.   She is at the [facility].   She is not taking her Lasix today or yesterday as she is concerned she is losing too much fluids, she is not swelling.  Reviewed with Dr. Kennon Rounds. She advised pushing fluids, BRAT diet as tolerated, continuing with Methodone as prescribed, and Imodium per box instructions.   Reviewed with patient if she worsens, not improving in 24 hours, not urinating, 6 or more contractions an hour for more than 2 hours, bleeding or leaking from the vagina she is to go to MAU for evaluation. Patient voiced understanding.    Patient to call back with any questions or concerns as needed.

## 2020-08-31 NOTE — Telephone Encounter (Signed)
Kelly [name] left a voice mail message she just spoke with a nurse this morning about having diarrhea. States the nurse said safe to take immodium. States the place where she is staying, [facility] [place], needs a doctor's note that it is ok to take Immodium faxed over. Would like a call back.  I called [name] and informed her we are happy to send a letter. I asked for the phone and fax numbers for [facility] the [facility]. She states phone is : [phone], fax is [phone]. I informed her I will fax in a few minutes. She voices understanding. [name],RN

## 2020-09-07 ENCOUNTER — Encounter: Payer: Self-pay | Admitting: *Deleted

## 2020-09-08 ENCOUNTER — Ambulatory Visit: Payer: Medicaid Other | Admitting: *Deleted

## 2020-09-08 ENCOUNTER — Encounter: Payer: Self-pay | Admitting: Obstetrics and Gynecology

## 2020-09-08 ENCOUNTER — Ambulatory Visit (INDEPENDENT_AMBULATORY_CARE_PROVIDER_SITE_OTHER): Payer: Medicaid Other | Admitting: Obstetrics and Gynecology

## 2020-09-08 ENCOUNTER — Ambulatory Visit (INDEPENDENT_AMBULATORY_CARE_PROVIDER_SITE_OTHER): Payer: Medicaid Other

## 2020-09-08 VITALS — BP 124/78 | HR 87 | Wt 188.7 lb

## 2020-09-08 DIAGNOSIS — F1990 Other psychoactive substance use, unspecified, uncomplicated: Secondary | ICD-10-CM

## 2020-09-08 DIAGNOSIS — F1910 Other psychoactive substance abuse, uncomplicated: Secondary | ICD-10-CM

## 2020-09-08 DIAGNOSIS — Z3A34 34 weeks gestation of pregnancy: Secondary | ICD-10-CM

## 2020-09-08 DIAGNOSIS — O09893 Supervision of other high risk pregnancies, third trimester: Secondary | ICD-10-CM | POA: Diagnosis not present

## 2020-09-08 DIAGNOSIS — O10919 Unspecified pre-existing hypertension complicating pregnancy, unspecified trimester: Secondary | ICD-10-CM

## 2020-09-08 DIAGNOSIS — Z3009 Encounter for other general counseling and advice on contraception: Secondary | ICD-10-CM

## 2020-09-08 DIAGNOSIS — I5022 Chronic systolic (congestive) heart failure: Secondary | ICD-10-CM

## 2020-09-08 DIAGNOSIS — R197 Diarrhea, unspecified: Secondary | ICD-10-CM

## 2020-09-08 DIAGNOSIS — O34219 Maternal care for unspecified type scar from previous cesarean delivery: Secondary | ICD-10-CM

## 2020-09-08 DIAGNOSIS — Z98891 History of uterine scar from previous surgery: Secondary | ICD-10-CM | POA: Diagnosis not present

## 2020-09-08 MED ORDER — LOPERAMIDE HCL 2 MG PO CAPS: 2.0000 mg | ORAL_CAPSULE | Freq: Four times a day (QID) | ORAL | 1 refills | Status: DC | PRN

## 2020-09-08 NOTE — Progress Notes (Signed)
Pt states she has been having multiple episodes of loose, watery diarrhea w/mucous since 4/17 following d/c from hospital. She is taking max dose of OTC Immodium without relief.

## 2020-09-08 NOTE — Progress Notes (Signed)
Subjective:  Kelly [name] is a 35 y.o. [phone] at 34w2dbeing seen today for ongoing prenatal care. First visit in clinic with this pregnancy. Pt was hospitalized for over 6 weeks for CHF and endocarditis. See hospital chart for additional information. She has a complicated medical history as outline in her medical records. Today she reports + FM and denies ut ctx. She does report watery diarrhea dispite taking Imodium.  She is currently monitored for the following issues for this high-risk pregnancy and has COLONIC POLYPS; CROHN'S DISEASE; Thoracic back pain; Opioid use disorder, severe, dependence ([place]; Unspecified episodic mood disorder; ADHD (attention deficit hyperactivity disorder); Cocaine abuse (HS[place]; Polysubstance abuse (H[place]; No prenatal care in current pregnancy in third trimester; Chronic hypertension during pregnancy, antepartum; History of cesarean delivery affecting pregnancy; Major depressive disorder, recurrent episode with anxious distress (HR[place]; Bipolar disorder (HWhipholt; Difficult intravenous access; IVDU (intravenous drug user); Degenerative arthritis of lumbar spine; 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); Tricuspid regurgitation; Rubella non-immune status, antepartum; Supervision of other high risk pregnancies, third trimester; Preexisting hypertension complicating pregnancy, antepartum; Dental caries; Phobia of dental procedure; Endocarditis; History of VBAC; Infection due to Streptococcus mitis group; Chronic systolic heart failure (HStony Point; Unwanted fertility; MDD (major depressive disorder), recurrent severe, without psychosis (HS[place]; Oth psychoactive substance abuse w mood disorder (HE[place]izabetht[place]; MRSA bacteremia; Acute on chronic congestive heart failure (HDe Soto; Cellulitis of right lower extremity; Septic embolism (HFlorala; Syphilis; Drug use affecting pregnancy in second trimester; Methadone maintenance treatment affecting pregnancy (HNewtonia; Maternal endocarditis  complicating pregnancy in third trimester; Pain, dental; History of hepatitis C; Scoliosis; Congenital heart defect; Septic pulmonary embolism (HEutaw[place]; Severe sepsis (HNich[place]; Acute on chronic systolic CHF (congestive heart failure) (HCole; Severe pulmonary hypertension (HMortons Gap; Endocarditis of tricuspid valve; Tricuspid valve regurgitation; Prolonged QT interval; and Diarrhea on their problem list.  Patient reports See above.  Contractions: Irregular. Vag. Bleeding: None.  Movement: Present. Denies leaking of fluid.   The following portions of the patient's history were reviewed and updated as appropriate: allergies, current medications, past family history, past medical history, past social history, past surgical history and problem list. Problem list updated.  Objective:   Vitals:   09/08/20 1521  BP: 124/78  Pulse: 87  Weight: 188 lb 11.2 oz (85.6 kg)    Fetal Status: Fetal Heart Rate (bpm): NST   Movement: Present     General:  Alert, oriented and cooperative. Patient is in no acute distress.  Skin: Skin is warm and dry. No rash noted.   Cardiovascular: Normal heart rate noted  Respiratory: Normal respiratory effort, no problems with respiration noted  Abdomen: Soft, gravid, appropriate for gestational age. Pain/Pressure: Present     Pelvic:  Cervical exam deferred        Extremities: Normal range of motion.     Mental Status: Normal mood and affect. Normal behavior. Normal judgment and thought content.   Urinalysis:      Assessment and Plan:  Pregnancy: [id number] 346w2d1. Supervision of other high risk pregnancies, third trimester Stable BPP 8/10 ( - 2 for breathing) Continue with weekly antenatal testing  Growth scan ordered as well - [place]FM OB FOLLOW UP; Future Consider IOL at 37 weeks  2. IVDU (intravenous drug user) Reports taking Methadone as prescribed  3. Chronic hypertension affecting pregnancy BP stable today without meds - US[place]FM OB FOLLOW UP; Future  4.  History of VBAC Desires repeat. TOLAC consent obtained today  5. Polysubstance abuse (HCPershingSee above  6.  Chronic systolic heart failure ([place]) Stable Continue with current treatment Has f/u up appt with Cardiology 09/22/20 Encouraged to keep this appt - [place] MFM OB FOLLOW UP; Future  7. Unwanted fertility BTL papers signed 08/05/20  8. History of cesarean delivery affecting pregnancy See above  9. Diarrhea, unspecified type Will check C. Diff before treating. Testing reviewed with pt Order placed and pt provided information to [address] location, to pick up specimen container and return to that office for processing - loperamide (IMODIUM) 2 MG capsule; Take 1 capsule (2 mg total) by mouth 4 (four) times daily as needed for diarrhea or loose stools.  Dispense: 30 capsule; Refill: 1 - Clostridium Difficile by PCR([facility]/Sunquest); Future - Clostridium Difficile by PCR([facility]/Sunquest)  Preterm labor symptoms and general obstetric precautions including but not limited to vaginal bleeding, contractions, leaking of fluid and fetal movement were reviewed in detail with the patient. Please refer to After Visit Summary for other counseling recommendations.  Return in about 1 week (around 09/15/2020) for Pt already has schedule.   Chancy Milroy, MD

## 2020-09-13 ENCOUNTER — Ambulatory Visit: Payer: Self-pay

## 2020-09-13 ENCOUNTER — Ambulatory Visit: Payer: No Typology Code available for payment source

## 2020-09-13 LAB — CLOSTRIDIUM DIFFICILE BY PCR: Toxigenic C. Difficile by PCR: POSITIVE — AB

## 2020-09-14 ENCOUNTER — Telehealth: Payer: Self-pay | Admitting: *Deleted

## 2020-09-14 ENCOUNTER — Encounter: Payer: Self-pay | Admitting: Obstetrics and Gynecology

## 2020-09-14 DIAGNOSIS — A0472 Enterocolitis due to Clostridium difficile, not specified as recurrent: Secondary | ICD-10-CM | POA: Insufficient documentation

## 2020-09-14 MED ORDER — VANCOMYCIN HCL 125 MG PO CAPS: 125.0000 mg | ORAL_CAPSULE | Freq: Four times a day (QID) | ORAL | 0 refills | Status: AC

## 2020-09-14 NOTE — Telephone Encounter (Addendum)
-----   Message from Chancy Milroy, MD sent at 09/14/2020 10:06 AM EDT ----- Please send in Rx for Vancomycin 125 mg, 1 po qid x 10 days for C. Diff. Pt is aware. Thanks Legrand Como  5/4  1142  Per chart review, pt has not yet seen the Mychart message from Dr. Rip Harbour. I called pt and informed her of diagnosis C. Diff. She reports that she is still having extreme diarrhea and is exhausted. Pt was advised of Rx antibiotics and dose instructions were give. She was also informed of this being contagious to others. Pt was instructed for strict attention to hand washing and cleaning of surfaces after contact (commode seat and sink faucets in particular).  Pt is currently staying @ [facility]. She states that no one else there is sick. Pt voiced understanding of all instructions and information given. She has office appt tomorrow for prenatal care.

## 2020-09-15 ENCOUNTER — Ambulatory Visit: Payer: Medicaid Other | Admitting: *Deleted

## 2020-09-15 ENCOUNTER — Ambulatory Visit (INDEPENDENT_AMBULATORY_CARE_PROVIDER_SITE_OTHER): Payer: Self-pay

## 2020-09-15 ENCOUNTER — Other Ambulatory Visit: Payer: Self-pay

## 2020-09-15 ENCOUNTER — Ambulatory Visit (INDEPENDENT_AMBULATORY_CARE_PROVIDER_SITE_OTHER): Payer: Medicaid Other | Admitting: Obstetrics and Gynecology

## 2020-09-15 ENCOUNTER — Encounter: Payer: Self-pay | Admitting: Obstetrics and Gynecology

## 2020-09-15 VITALS — BP 114/80 | HR 87 | Wt 188.5 lb

## 2020-09-15 DIAGNOSIS — Z3009 Encounter for other general counseling and advice on contraception: Secondary | ICD-10-CM

## 2020-09-15 DIAGNOSIS — Z98891 History of uterine scar from previous surgery: Secondary | ICD-10-CM

## 2020-09-15 DIAGNOSIS — O10919 Unspecified pre-existing hypertension complicating pregnancy, unspecified trimester: Secondary | ICD-10-CM

## 2020-09-15 DIAGNOSIS — O34219 Maternal care for unspecified type scar from previous cesarean delivery: Secondary | ICD-10-CM

## 2020-09-15 DIAGNOSIS — O09893 Supervision of other high risk pregnancies, third trimester: Secondary | ICD-10-CM

## 2020-09-15 DIAGNOSIS — Z3A35 35 weeks gestation of pregnancy: Secondary | ICD-10-CM

## 2020-09-15 DIAGNOSIS — Z2839 Other underimmunization status: Secondary | ICD-10-CM

## 2020-09-15 DIAGNOSIS — O09899 Supervision of other high risk pregnancies, unspecified trimester: Secondary | ICD-10-CM

## 2020-09-15 DIAGNOSIS — F319 Bipolar disorder, unspecified: Secondary | ICD-10-CM

## 2020-09-15 DIAGNOSIS — A0472 Enterocolitis due to Clostridium difficile, not specified as recurrent: Secondary | ICD-10-CM

## 2020-09-15 DIAGNOSIS — I5022 Chronic systolic (congestive) heart failure: Secondary | ICD-10-CM

## 2020-09-15 DIAGNOSIS — F1120 Opioid dependence, uncomplicated: Secondary | ICD-10-CM

## 2020-09-15 MED ORDER — PROMETHAZINE HCL 25 MG PO TABS: 25.0000 mg | ORAL_TABLET | Freq: Four times a day (QID) | ORAL | 0 refills | Status: DC | PRN

## 2020-09-15 NOTE — Progress Notes (Signed)
Tx w/Vancomycin for C.diff started yesterday.  Pt requests refill of promethazine. Pt states she has requested behavioral health services from [place] however has not received a response. I offered [facility] appt with this office in the meantime and pt accepted - she will schedule appt, referral order placed.

## 2020-09-15 NOTE — Progress Notes (Signed)
Subjective:  Kelly [name] is a 35 y.o. [phone] at 64w2dbeing seen today for ongoing prenatal care.  She is currently monitored for the following issues for this high-risk pregnancy and has COLONIC POLYPS; CROHN'S DISEASE; Thoracic back pain; Opioid use disorder, severe, dependence (HPinewood Estates; Unspecified episodic mood disorder; ADHD (attention deficit hyperactivity disorder); Cocaine abuse (HS[place]; Polysubstance abuse (HJackson[place]; No prenatal care in current pregnancy in third trimester; Chronic hypertension during pregnancy, antepartum; History of cesarean delivery affecting pregnancy; Major depressive disorder, recurrent episode with anxious distress (HL[place] De[place]; Bipolar disorder (HBang[place]; Difficult intravenous access; IVDU (intravenous drug user); Degenerative arthritis of lumbar spine; 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); Tricuspid regurgitation; Rubella non-immune status, antepartum; Supervision of other high risk pregnancies, third trimester; Preexisting hypertension complicating pregnancy, antepartum; Dental caries; Phobia of dental procedure; Endocarditis; History of VBAC; Infection due to Streptococcus mitis group; Chronic systolic heart failure (HTrous[place]; Unwanted fertility; MDD (major depressive disorder), recurrent severe, without psychosis (H[place]; Oth psychoactive substance abuse w mood disorder (HCalais; MRSA bacteremia; Acute on chronic congestive heart failure (HFleischmanns; Cellulitis of right lower extremity; Septic embolism (HShell Point; Syphilis; Drug use affecting pregnancy in second trimester; Methadone maintenance treatment affecting pregnancy (HCamp [place]; Maternal endocarditis complicating pregnancy in third trimester; Pain, dental; History of hepatitis C; Scoliosis; Congenital heart defect; Septic pulmonary embolism (HNew [place]; Severe sepsis (HTh[place]; Acute on chronic systolic CHF (congestive heart failure) (HBayville; Severe pulmonary hypertension (HGreen Valley Farms; Endocarditis of tricuspid valve; Tricuspid valve  regurgitation; Prolonged QT interval; and C. difficile diarrhea on their problem list.  Patient reports still with diarrhea, started Vancomycin yesterday..  Contractions: Irregular. Vag. Bleeding: None.  Movement: Present. Denies leaking of fluid.   The following portions of the patient's history were reviewed and updated as appropriate: allergies, current medications, past family history, past medical history, past social history, past surgical history and problem list. Problem list updated.  Objective:   Vitals:   09/15/20 1327  BP: 114/80  Pulse: 87  Weight: 188 lb 8 oz (85.5 kg)    Fetal Status: Fetal Heart Rate (bpm): NST   Movement: Present     General:  Alert, oriented and cooperative. Patient is in no acute distress.  Skin: Skin is warm and dry. No rash noted.   Cardiovascular: Normal heart rate noted  Respiratory: Normal respiratory effort, no problems with respiration noted  Abdomen: Soft, gravid, appropriate for gestational age. Pain/Pressure: Present     Pelvic:  Cervical exam deferred        Extremities: Normal range of motion.     Mental Status: Normal mood and affect. Normal behavior. Normal judgment and thought content.   Urinalysis:      Assessment and Plan:  Pregnancy: [id number] 371w2d1. Supervision of other high risk pregnancies, third trimester Stable GBS and vaginal cultures next week, not performed this visit d/t C. Diff  2. Chronic hypertension affecting pregnancy BP stable BPP/NST today Growth scan and antenatal testing next week  3. History of cesarean delivery affecting pregnancy Desires TOLAC Successful VBAC Consented  4. Opioid use disorder, severe, dependence (HCC) Stable on Methadone  5. C. difficile diarrhea Vancomycin oral treatment started yesterday  6. Rubella non-immune status, antepartum Vaccine PP  7. Preexisting hypertension complicating pregnancy, antepartum BP stable Continue with Antenatal and serial growth scans  8.  History of VBAC See above  9. Unwanted fertility BTL papers signed  10. Chronic systolic heart failure (HCC) Stable Has appt with Cardiology next week  [facility]OL at 37 weeks [facility]  Preterm labor symptoms and general obstetric precautions including but not limited to vaginal bleeding, contractions, leaking of fluid and fetal movement were reviewed in detail with the patient. Please refer to After Visit Summary for other counseling recommendations.  Return in about 1 week (around 09/22/2020) for OB visit, face to face, MD only.   Chancy Milroy, MD

## 2020-09-15 NOTE — Patient Instructions (Signed)
[email or web address]">  Third Trimester of Pregnancy  The third trimester of pregnancy is from week 28 through week 40. This is months 7 through 9. The third trimester is a time when the unborn baby (fetus) is growing rapidly. At the end of the ninth month, the fetus is about 20 inches long and weighs 6-10 pounds. Body changes during your third trimester During the third trimester, your body will continue to go through many changes. The changes vary and generally return to normal after your baby is born. Physical changes  Your weight will continue to increase. You can expect to gain 25-35 pounds (11-16 kg) by the end of the pregnancy if you begin pregnancy at a normal weight. If you are underweight, you can expect to gain 28-40 lb (about 13-18 kg), and if you are overweight, you can expect to gain 15-25 lb (about 7-11 kg).  You may begin to get stretch marks on your hips, abdomen, and breasts.  Your breasts will continue to grow and may hurt. A yellow fluid (colostrum) may leak from your breasts. This is the first milk you are producing for your baby.  You may have changes in your hair. These can include thickening of your hair, rapid growth, and changes in texture. Some people also have hair loss during or after pregnancy, or hair that feels dry or thin.  Your belly button may stick out.  You may notice more swelling in your hands, face, or ankles. Health changes  You may have heartburn.  You may have constipation.  You may develop hemorrhoids.  You may develop swollen, bulging veins (varicose veins) in your legs.  You may have increased body aches in the pelvis, back, or thighs. This is due to weight gain and increased hormones that are relaxing your joints.  You may have increased tingling or numbness in your hands, arms, and legs. The skin on your abdomen may also feel numb.  You may feel short of breath because of your  expanding uterus. Other changes  You may urinate more often because the fetus is moving lower into your pelvis and pressing on your bladder.  You may have more problems sleeping. This may be caused by the size of your abdomen, an increased need to urinate, and an increase in your body's metabolism.  You may notice the fetus "dropping," or moving lower in your abdomen (lightening).  You may have increased vaginal discharge.  You may notice that you have pain around your pelvic bone as your uterus distends. Follow these instructions at home: Medicines  Follow your health care provider's instructions regarding medicine use. Specific medicines may be either safe or unsafe to take during pregnancy. Do not take any medicines unless approved by your health care provider.  Take a prenatal vitamin that contains at least 600 micrograms (mcg) of folic acid. Eating and drinking  Eat a healthy diet that includes fresh fruits and vegetables, whole grains, good sources of protein such as meat, eggs, or tofu, and low-fat dairy products.  Avoid raw meat and unpasteurized juice, milk, and cheese. These carry germs that can harm you and your baby.  Eat 4 or 5 small meals rather than 3 large meals a day.  You may need to take these actions to prevent or treat constipation: ? Drink enough fluid to keep your urine pale yellow. ? Eat foods that are high in fiber, such as beans, whole grains, and fresh fruits and vegetables. ? Limit foods that are high in fat and  processed sugars, such as fried or sweet foods. Activity  Exercise only as directed by your health care provider. Most people can continue their usual exercise routine during pregnancy. Try to exercise for 30 minutes at least 5 days a week. Stop exercising if you experience contractions in the uterus.  Stop exercising if you develop pain or cramping in the lower abdomen or lower back.  Avoid heavy lifting.  Do not exercise if it is very hot or  humid or if you are at a high altitude.  If you choose to, you may continue to have sex unless your health care provider tells you not to. Relieving pain and discomfort  Take frequent breaks and rest with your legs raised (elevated) if you have leg cramps or low back pain.  Take warm sitz baths to soothe any pain or discomfort caused by hemorrhoids. Use hemorrhoid cream if your health care provider approves.  Wear a supportive bra to prevent discomfort from breast tenderness.  If you develop varicose veins: ? Wear support hose as told by your health care provider. ? Elevate your feet for 15 minutes, 3-4 times a day. ? Limit salt in your diet. Safety  Talk to your health care provider before traveling far distances.  Do not use hot tubs, steam rooms, or saunas.  Wear your seat belt at all times when driving or riding in a car.  Talk with your health care provider if someone is verbally or physically abusive to you. Preparing for birth To prepare for the arrival of your baby:  Take prenatal classes to understand, practice, and ask questions about labor and delivery.  Visit the hospital and tour the maternity area.  Purchase a rear-facing car seat and make sure you know how to install it in your car.  Prepare the baby's room or sleeping area. Make sure to remove all pillows and stuffed animals from the baby's crib to prevent suffocation. General instructions  Avoid cat litter boxes and soil used by cats. These carry germs that can cause birth defects in the baby. If you have a cat, ask someone to clean the litter box for you.  Do not douche or use tampons. Do not use scented sanitary pads.  Do not use any products that contain nicotine or tobacco, such as cigarettes, e-cigarettes, and chewing tobacco. If you need help quitting, ask your health care provider.  Do not use any herbal remedies, illegal drugs, or medicines that were not prescribed to you. Chemicals in these products  can harm your baby.  Do not drink alcohol.  You will have more frequent prenatal exams during the third trimester. During a routine prenatal visit, your health care provider will do a physical exam, perform tests, and discuss your overall health. Keep all follow-up visits. This is important. Where to find more information  American Pregnancy Association: americanpregnancy.[place] and Gynecologists: PoolDevices.com.pt  Office on [employer]: KeywordPortfolios.com.br Contact a health care provider if you have:  A fever.  Mild pelvic cramps, pelvic pressure, or nagging pain in your abdominal area or lower back.  Vomiting or diarrhea.  Bad-smelling vaginal discharge or foul-smelling urine.  Pain when you urinate.  A headache that does not go away when you take medicine.  Visual changes or see spots in front of your eyes. Get help right away if:  Your water breaks.  You have regular contractions less than 5 minutes apart.  You have spotting or bleeding from your vagina.  You  have severe abdominal pain.  You have difficulty breathing.  You have chest pain.  You have fainting spells.  You have not felt your baby move for the time period told by your health care provider.  You have new or increased pain, swelling, or redness in an arm or leg. Summary  The third trimester of pregnancy is from week 28 through week 40 (months 7 through 9).  You may have more problems sleeping. This can be caused by the size of your abdomen, an increased need to urinate, and an increase in your body's metabolism.  You will have more frequent prenatal exams during the third trimester. Keep all follow-up visits. This is important. This information is not intended to replace advice given to you by your health care provider. Make sure you discuss any questions you have with your health care provider. Document Revised: 10/07/2019 Document  Reviewed: 08/13/2019 Elsevier Patient Education  2021 [employer].

## 2020-09-16 ENCOUNTER — Encounter: Payer: Self-pay | Admitting: Lactation Services

## 2020-09-16 ENCOUNTER — Telehealth: Payer: Self-pay | Admitting: Lactation Services

## 2020-09-16 NOTE — Telephone Encounter (Signed)
Patient called and LM that she needs a letter stating she has a diagnosis of Cdiff.   Called patient and informed her that letter is in My Chart. Will send My Chart message that it is up to her whether she shares the letter due to [place].

## 2020-09-16 NOTE — BH Specialist Note (Signed)
Integrated Behavioral Health via Telemedicine Visit  09/16/2020 Kelly [name] [id number]  Number of [place] visits: 1 Session Start time: 10:33  Session End time: 11:08 Total time: 35   Referring Provider: Clayton Lefort, MD Patient/Family location: Home [facility] Provider location: [facility]  All persons participating in visit: Patient Kelly [name] and Kelly [name]   Types of Service: Telephone visit  I connected with [employer] and/or [employer] n/a via  Telephone or [employer]  (Video is Tree surgeon) and verified that I am speaking with the correct person using two identifiers. Discussed confidentiality: Yes   I discussed the limitations of telemedicine and the availability of in person appointments.  Discussed there is a possibility of technology failure and discussed alternative modes of communication if that failure occurs.  I discussed that engaging in this telemedicine visit, they consent to the provision of behavioral healthcare and the services will be billed under their insurance.  Patient and/or legal guardian expressed understanding and consented to Telemedicine visit: Yes   Presenting Concerns: Patient and/or family reports the following symptoms/concerns: Pt states her primary concern today is worry over not having inpatient treatment set up prior to discharge from hospital after induction on 09/29/20; pt staying at maternity home until birth, is working with case worker to get into treatment. Pt worries about being untreated for ADHD, depression and anxiety (says has been on Adderral, Neurontin, Wellbutrin and Lexapro in the past); Zoloft not helpful. Pt quit smoking 07/13/20, attending methadone clinic daily, and motivated to be a good mother for her baby.  Duration of problem: Current pregnancy; Severity of problem: severe  Patient  and/or Family's Strengths/Protective Factors: Social connections and Sense of purpose  Goals Addressed: Patient will: 1.  Reduce symptoms of: anxiety, depression and stress  2.  Increase knowledge and/or ability of: healthy habits and stress reduction  3.  Demonstrate ability to: Increase healthy adjustment to current life circumstances, Increase adequate support systems for patient/family and Decrease self-medicating behaviors  Progress towards Goals: Ongoing  Interventions: Interventions utilized:  Solution-Focused Strategies and Psychoeducation and/or Health Education Standardized Assessments completed: Not Needed  Patient and/or Family Response: Pt agrees with treatment plan  Assessment: Patient currently experiencing Opioid use disorder, History of bipolar disorder, History of ADHD (all previously diagnosed)   Patient may benefit from psychoeducation and brief therapeutic interventions regarding coping with symptoms of depression, anxiety, current life stress  Plan: 1. Follow up with behavioral health clinician on : Two weeks; Call Kelly [name] as needed prior to scheduled visit at [phone] 2. Behavioral recommendations:  -Continue taking prenatal vitamin as prescribed -Continue to work with case worker on placement into inpatient treatment for postpartum -Continue to utilize any services available at [facility] for as long as able -Accept referral to [facility]; use walk-in Wednesday morning prior to induction, if able, as discussed 3. Referral(s): [place] (In Clinic)  I discussed the assessment and treatment plan with the patient and/or parent/guardian. They were provided an opportunity to ask questions and all were answered. They agreed with the plan and demonstrated an understanding of the instructions.   They were advised to call back or seek an in-person evaluation if the symptoms worsen or if the condition fails to  improve as anticipated.  Kelly [name] Kelly Kopplin, LCSW

## 2020-09-19 ENCOUNTER — Telehealth: Payer: Self-pay | Admitting: Lactation Services

## 2020-09-19 NOTE — Telephone Encounter (Signed)
Patient call and LM on nurse voicemail that she is experiencing increased pelvic pressure with walking. She would like a nurse to call her back.   Returned patients call. Patient reports she has been experiencing increased pressure in the last 2 days. The first 2 steps are the worst and then gets a little better. She reports she baby feels low and it hurts to walk. She did have a spot of blood last night when wiping, no other.   She denies any increased contractions or cramping, she feels them intermittently and they can be painful at times. She has been passing thick mucous in the last week. Baby is moving well per mom.   Reviewed that she may be starting to dialate with the presence of vaginal mucous and that infant head may have dropped putting pressure on pubic bone, patient does not feel like she is in labor.   She reports her diarrhea has finally improved. Reviewed she most likely will be on enteric precautions upon admission to the hospital, patient voiced understanding.   Reviewed that if she is having more that 5 contractions an hour for more than an hour, vaginal bleeding, vaginal leaking, or pain increases, go to the MA[facility] at [facility] for evaluation. Patient to be seen by MFM tomorrow for [facility] and 5/12 in the office.   Patient to call with any questions or concerns as needed.

## 2020-09-20 ENCOUNTER — Ambulatory Visit: Payer: Medicaid Other | Admitting: *Deleted

## 2020-09-20 ENCOUNTER — Ambulatory Visit: Payer: Medicaid Other | Attending: Obstetrics and Gynecology

## 2020-09-20 ENCOUNTER — Encounter: Payer: Self-pay | Admitting: *Deleted

## 2020-09-20 ENCOUNTER — Other Ambulatory Visit: Payer: Self-pay

## 2020-09-20 ENCOUNTER — Telehealth (HOSPITAL_COMMUNITY): Payer: Self-pay | Admitting: Licensed Clinical Social Worker

## 2020-09-20 VITALS — BP 116/81 | HR 74

## 2020-09-20 DIAGNOSIS — O10913 Unspecified pre-existing hypertension complicating pregnancy, third trimester: Secondary | ICD-10-CM | POA: Insufficient documentation

## 2020-09-20 DIAGNOSIS — O09893 Supervision of other high risk pregnancies, third trimester: Secondary | ICD-10-CM | POA: Diagnosis present

## 2020-09-20 DIAGNOSIS — I5022 Chronic systolic (congestive) heart failure: Secondary | ICD-10-CM | POA: Diagnosis not present

## 2020-09-20 DIAGNOSIS — O10919 Unspecified pre-existing hypertension complicating pregnancy, unspecified trimester: Secondary | ICD-10-CM | POA: Diagnosis not present

## 2020-09-20 NOTE — Telephone Encounter (Signed)
CSW called pt to remind of appt on Thursday with HF Clinic.  Pt is aware of appt and expresses no barriers to attending- will be getting transportation assistance through [facility] which is where she is currently staying.   Pt did inquire about [place] sending her methadone clinic an EKG- CSW explained we would need a medical documentation request in order to send them things directly- she will give them our fax number tomorrow so they can send to [place].  Will continue to follow and assist as needed  [name], [facility] Desk#: [phone] Cell#: [phone]

## 2020-09-22 ENCOUNTER — Ambulatory Visit (HOSPITAL_COMMUNITY)
Admit: 2020-09-22 | Discharge: 2020-09-22 | Disposition: A | Discharge status: Home / Self Care | Payer: Medicaid Other | Source: Ambulatory Visit | Attending: Internal Medicine | Admitting: Internal Medicine

## 2020-09-22 ENCOUNTER — Ambulatory Visit (HOSPITAL_BASED_OUTPATIENT_CLINIC_OR_DEPARTMENT_OTHER)
Admission: RE | Admit: 2020-09-22 | Discharge: 2020-09-22 | Disposition: A | Discharge status: Home / Self Care | Payer: Medicaid Other | Source: Ambulatory Visit | Attending: Internal Medicine | Admitting: Internal Medicine

## 2020-09-22 ENCOUNTER — Ambulatory Visit (INDEPENDENT_AMBULATORY_CARE_PROVIDER_SITE_OTHER): Payer: Medicaid Other | Admitting: *Deleted

## 2020-09-22 ENCOUNTER — Other Ambulatory Visit: Payer: Self-pay

## 2020-09-22 ENCOUNTER — Other Ambulatory Visit (HOSPITAL_COMMUNITY)
Admission: RE | Admit: 2020-09-22 | Discharge: 2020-09-22 | Disposition: A | Discharge status: Home / Self Care | Payer: Medicaid Other | Source: Ambulatory Visit | Attending: Obstetrics and Gynecology | Admitting: Obstetrics and Gynecology

## 2020-09-22 ENCOUNTER — Ambulatory Visit (INDEPENDENT_AMBULATORY_CARE_PROVIDER_SITE_OTHER): Payer: Medicaid Other | Admitting: Obstetrics and Gynecology

## 2020-09-22 ENCOUNTER — Encounter (HOSPITAL_COMMUNITY): Payer: Self-pay | Admitting: Internal Medicine

## 2020-09-22 VITALS — BP 110/70 | HR 94 | Wt 202.0 lb

## 2020-09-22 VITALS — BP 102/79 | HR 84 | Wt 198.8 lb

## 2020-09-22 DIAGNOSIS — Z3A36 36 weeks gestation of pregnancy: Secondary | ICD-10-CM | POA: Diagnosis not present

## 2020-09-22 DIAGNOSIS — O99413 Diseases of the circulatory system complicating pregnancy, third trimester: Secondary | ICD-10-CM | POA: Diagnosis not present

## 2020-09-22 DIAGNOSIS — R0602 Shortness of breath: Secondary | ICD-10-CM | POA: Diagnosis not present

## 2020-09-22 DIAGNOSIS — Z560 Unemployment, unspecified: Secondary | ICD-10-CM | POA: Insufficient documentation

## 2020-09-22 DIAGNOSIS — Z79899 Other long term (current) drug therapy: Secondary | ICD-10-CM | POA: Insufficient documentation

## 2020-09-22 DIAGNOSIS — Z98891 History of uterine scar from previous surgery: Secondary | ICD-10-CM

## 2020-09-22 DIAGNOSIS — O99323 Drug use complicating pregnancy, third trimester: Secondary | ICD-10-CM

## 2020-09-22 DIAGNOSIS — O99320 Drug use complicating pregnancy, unspecified trimester: Secondary | ICD-10-CM | POA: Diagnosis not present

## 2020-09-22 DIAGNOSIS — I071 Rheumatic tricuspid insufficiency: Secondary | ICD-10-CM

## 2020-09-22 DIAGNOSIS — Z87891 Personal history of nicotine dependence: Secondary | ICD-10-CM | POA: Insufficient documentation

## 2020-09-22 DIAGNOSIS — F1910 Other psychoactive substance abuse, uncomplicated: Secondary | ICD-10-CM | POA: Diagnosis not present

## 2020-09-22 DIAGNOSIS — Z8619 Personal history of other infectious and parasitic diseases: Secondary | ICD-10-CM

## 2020-09-22 DIAGNOSIS — A0472 Enterocolitis due to Clostridium difficile, not specified as recurrent: Secondary | ICD-10-CM

## 2020-09-22 DIAGNOSIS — O99343 Other mental disorders complicating pregnancy, third trimester: Secondary | ICD-10-CM | POA: Diagnosis not present

## 2020-09-22 DIAGNOSIS — Z3009 Encounter for other general counseling and advice on contraception: Secondary | ICD-10-CM

## 2020-09-22 DIAGNOSIS — I079 Rheumatic tricuspid valve disease, unspecified: Secondary | ICD-10-CM | POA: Insufficient documentation

## 2020-09-22 DIAGNOSIS — F319 Bipolar disorder, unspecified: Secondary | ICD-10-CM | POA: Insufficient documentation

## 2020-09-22 DIAGNOSIS — F1120 Opioid dependence, uncomplicated: Secondary | ICD-10-CM

## 2020-09-22 DIAGNOSIS — O10913 Unspecified pre-existing hypertension complicating pregnancy, third trimester: Secondary | ICD-10-CM | POA: Insufficient documentation

## 2020-09-22 DIAGNOSIS — O10919 Unspecified pre-existing hypertension complicating pregnancy, unspecified trimester: Secondary | ICD-10-CM

## 2020-09-22 DIAGNOSIS — O09893 Supervision of other high risk pregnancies, third trimester: Secondary | ICD-10-CM

## 2020-09-22 DIAGNOSIS — O26893 Other specified pregnancy related conditions, third trimester: Secondary | ICD-10-CM | POA: Diagnosis present

## 2020-09-22 DIAGNOSIS — I5022 Chronic systolic (congestive) heart failure: Secondary | ICD-10-CM | POA: Diagnosis not present

## 2020-09-22 DIAGNOSIS — I330 Acute and subacute infective endocarditis: Secondary | ICD-10-CM

## 2020-09-22 DIAGNOSIS — O34219 Maternal care for unspecified type scar from previous cesarean delivery: Secondary | ICD-10-CM

## 2020-09-22 DIAGNOSIS — O98113 Syphilis complicating pregnancy, third trimester: Secondary | ICD-10-CM

## 2020-09-22 LAB — CBC
HCT: 33.3 % — ABNORMAL LOW (ref 36.0–46.0)
Hemoglobin: 10.7 g/dL — ABNORMAL LOW (ref 12.0–15.0)
MCH: 29.7 pg (ref 26.0–34.0)
MCHC: 32.1 g/dL (ref 30.0–36.0)
MCV: 92.5 fL (ref 80.0–100.0)
Platelets: 337 10*3/uL (ref 150–400)
RBC: 3.6 MIL/uL — ABNORMAL LOW (ref 3.87–5.11)
RDW: 15.1 % (ref 11.5–15.5)
WBC: 10.3 10*3/uL (ref 4.0–10.5)
nRBC: 0 % (ref 0.0–0.2)

## 2020-09-22 LAB — ECHOCARDIOGRAM COMPLETE
Area-P 1/2: 5.97 cm2
S' Lateral: 3.1 cm
Single Plane A4C EF: 46.8 %
Weight: 3232 oz

## 2020-09-22 LAB — BRAIN NATRIURETIC PEPTIDE: B Natriuretic Peptide: 141.2 pg/mL — ABNORMAL HIGH (ref 0.0–100.0)

## 2020-09-22 LAB — BASIC METABOLIC PANEL
Anion gap: 7 (ref 5–15)
BUN: 12 mg/dL (ref 6–20)
CO2: 27 mmol/L (ref 22–32)
Calcium: 8.8 mg/dL — ABNORMAL LOW (ref 8.9–10.3)
Chloride: 99 mmol/L (ref 98–111)
Creatinine, Ser: 0.62 mg/dL (ref 0.44–1.00)
GFR, Estimated: >60 mL/min (ref >60)
Glucose, Bld: 72 mg/dL (ref 70–99)
Potassium: 4.4 mmol/L (ref 3.5–5.1)
Sodium: 133 mmol/L — ABNORMAL LOW (ref 135–145)

## 2020-09-22 NOTE — Progress Notes (Addendum)
ADVANCED HF CLINIC   PCP: Pa, [facility] Primary Cardiologist: DB  HPI:  Kelly [name] is a 35 y.o. 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 Echo at that time showed severe TR and a tricuspid vegetation. She was treated with IV antibiotics. She had a prolonged hospitalization and underwent laminectomy and drainage of lumbar abscess. She was readmitted in 06/2020 with sepsis, pneumonia, and recurrent endocarditis. Echo showed normal LV function with severe TR and a large tricuspid vegetation. On repeat Echo in 08/2018, LVEF 30-35% with resolution of tricuspid vegetation but she still had severe TR. She was [redacted] weeks pregnant at the time. For a while, she was able to remained off drugs. She underwent TEE and R/LHC in 05/2019 in preparation for possible surgical repair. TEE showed LVEF of 40-45% with global hypokinesis and wide open TR with no residual vegetation. RV was moderately enlarged with mildly reduced systolic function. R/LHC showed normal coronaries and severe TR with well compensated filling pressure and low cardiac output by Fick. She was seen by Dr. Servando Snare in 07/2019 at which point she was having some dental problems. Plan was to address dental issues and then follow-up with her in 6 weeks to continue to discuss possible valve surgery. Unfortunately, patient was lost to follow-up.  Admitted again in 3/22 with recurrent endocarditis and R-sided HF. Found to be [redacted] weeks pregnant  She was cultured and started on ceftriaxone and azithromycin.  ID was consulted and her antibiotics were updated to vancomycin and cefazolin. She had PICC line  placed for antibiotic 6 week course, and was hospitalized as a poor candidate for outpatient therapy due to drug abuse. She was maintained on methadone and was to continue after discharge. Echo in hospital EF 55% Severe TR severely dilated RV with moderately reduced RV function. RVSP 41mHG. 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   Now 36.[redacted] weeks pregnant. Doing well. Gaining weight. [facility]  No edema on low-dose lasix. No dizziness. No f/c. Can do ADLs. Mild SOB.   Discussing what to do after delivery. Wants to go to Rehab after delivery.    ROS: All systems negative except as listed in HPI, PMH and Problem List.  SH:  Social History   Socioeconomic History  . Marital status: Single    Spouse name: Not on file  . Number of children: Not on file  . Years of education: Not on file  . Highest education level: Not on file  Occupational History  . Occupation: unemployed  Tobacco Use  . Smoking status: Former Smoker    Packs/day: 1.00    Years: 13.00    Pack years: 13.00    Types: Cigarettes    Quit date: 06/13/2018    Years since quitting: 2.2  . Smokeless tobacco: Never Used  Vaping Use  . Vaping Use: Never used  Substance and Sexual Activity  . Alcohol use: Not Currently    Comment: one or two drinks weekly  . Drug use: Yes    Types: Cocaine, Heroin, IV, Fentanyl    Comment: heroin, crack cocaine, fentanyl last used  today (07/13/20)   . Sexual activity: Yes    Birth control/protection: Condom  Other Topics Concern  . Not on file  Social History Narrative  . Not on file   Social Determinants of Health   Financial Resource Strain: High Risk  . Difficulty of Paying Living Expenses: Very hard  Food Insecurity: No Food Insecurity  . Worried About Charity fundraiser in the Last Year: Never true  . Ran Out of Food in the Last Year: Never true  Transportation Needs: Unmet Transportation Needs  . Lack of  Transportation (Medical): Yes  . Lack of Transportation (Non-Medical): Yes  Physical Activity: Not on file  Stress: Not on file  Social Connections: Not on file  Intimate Partner Violence: Not on file    FH:  Family History  Problem Relation Age of Onset  . Diabetes Mother   . Diabetes Father   . Hypertension Father   . Heart attack Neg Hx   . Hyperlipidemia Neg Hx   . Sudden death Neg Hx     Past Medical History:  Diagnosis Date  . 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 Abscess of breast    rt breast  . Abscess of right breast 07/13/2014  . Acute cystitis without hematuria   . ADD (attention deficit disorder)   . Back pain   . Bacteremia due to Enterococcus 06/15/2018  . Bipolar 1 disorder ([place])   . Endocarditis 2020  . Foreign body granuloma of soft tissue, NEC, right upper arm   . History of hepatitis C 10/01/2018  . Hypertension   . Influenza B 06/17/2018  . Leukocytosis   . Multifocal pneumonia 06/14/2018  . Polysubstance abuse ([place])   . Pyelonephritis affecting pregnancy 05/24/2016  . Sepsis ([place]) 12/02/2017  . Suicidal overdose ([place])     Current Outpatient Medications  Medication Sig Dispense Refill  . furosemide (LASIX) 40 MG tablet Take 1 tablet (40 mg total) by mouth daily. 30 tablet 2  . loperamide (IMODIUM) 2 MG capsule Take 1 capsule (2 mg total) by mouth 4 (four) times daily as needed for diarrhea or loose stools. 30 capsule 1  . methadone (DOLOPHINE) 10 MG/5ML solution Take 140 mg by mouth daily.    . prenatal vitamin w/FE, FA (NATACHEW) 29-1 MG CHEW chewable tablet Chew 1 tablet by mouth daily at 12 noon. 30 tablet 2  . promethazine (PHENERGAN) 25 MG tablet Take 1 tablet (25 mg total) by mouth every 6 (six) hours as needed for nausea or vomiting. 30 tablet 0  . sertraline (ZOLOFT) 50 MG tablet Take 1 tablet (50 mg total) by mouth daily. 30 tablet 1  . vancomycin (VANCOCIN) 125 MG capsule Take 1 capsule (125 mg total) by mouth 4 (four)  times daily for 10 days. 40 capsule 0   No current facility-administered medications for this encounter.    Vitals:   09/22/20 0959  BP: 110/70  Pulse: 94  SpO2: 97%  Weight: 91.6 kg (202 lb)    PHYSICAL EXAM:  General:  Gravid. Ambulates the clinic No resp difficulty HEENT: normal Neck: supple. JVP 9-10 Carotids 2+ bilaterally; no bruits. No lymphadenopathy or thryomegaly appreciated. Cor: PMI normal. Regular rate & rhythm. 3/6 TR Lungs: clear Abdomen: soft, + gravid No hepatosplenomegaly. No bruits or masses. Good bowel sounds. Extremities: no cyanosis, clubbing, rash, edema Neuro: alert & orientedx3, cranial nerves grossly intact. Moves all 4 extremities w/o difficulty. Affect pleasant.   ECG: NSR 78 Personally  reviewed   ASSESSMENT & PLAN: 1. 88w3dgestation  - planning for vaginal delivery next Tuesday. Will need to avoid prolonged labor with RV failure and elevated R-sided pressures - will get echo today   2. Recurrent Tricuspid Endocarditis with Severe TR - Echo 3/22 LVEF 50-55% Severe TR RV severely dilated with normal  function. Flattened septum  - Will repeat echo - Refer back to TCTS to discuss potential timing of TVR. I suspect she will need to go through rehab first - TVR will be complicated by significant RV dysfunction  - continue lasix  3. RV failure in setting of severe TR - echo pending today - Volume status ok - Continue lasix - Will need eventual TVR  4. Polysubstance abuse - wanting to go to rehab - will meet with SW team today  DGlori Bickers MD  10:26 AM

## 2020-09-22 NOTE — Patient Instructions (Signed)
Labs done today, your results will be available in MyChart, we will contact you for abnormal readings.  Your physician has requested that you have an echocardiogram. Echocardiography is a painless test that uses sound waves to create images of your heart. It provides your doctor with information about the size and shape of your heart and how well your heart's chambers and valves are working. This procedure takes approximately one hour. There are no restrictions for this procedure.  TODAY  You have been referred to Cardiothoracic Surgeons, they will call you to schedule an appointment  Your physician recommends that you schedule a follow-up appointment in: 2 months  If you have any questions or concerns before your next appointment please send [place] a message through [facility] or call our office at [phone].    TO LEAVE A MESSAGE FOR THE NURSE SELECT OPTION 2, PLEASE LEAVE A MESSAGE INCLUDING: . YOUR NAME . DATE OF BIRTH . CALL BACK NUMBER . REASON FOR CALL**this is important as we prioritize the call backs  [place] AS LONG AS YOU CALL BEFORE 4:00 PM  At the [facility], you and your health needs are our priority. As part of our continuing mission to provide you with exceptional heart care, we have created designated Provider Care Teams. These Care Teams include your primary Cardiologist (physician) and Advanced Practice Providers (APPs- Physician Assistants and Nurse Practitioners) who all work together to provide you with the care you need, when you need it.   You may see any of the following providers on your designated Care Team at your next follow up: Marland Kitchen Dr Glori Bickers . Dr Loralie Champagne . Dr Vickki Muff . Darrick Grinder, NP . Lyda Jester, [place] . Audry Riles, PharmD   Please be sure to bring in all your medications bottles to every appointment.

## 2020-09-22 NOTE — Progress Notes (Signed)
Heart and Vascular Care Navigation  09/22/2020  Kelly [name] 1986/04/09 [id number]  Reason for Referral:  Housing concerns/ substance abuse concerns    Engaged with patient face to face for initial visit for Heart and Vascular Care Coordination.                                                                                                   Assessment:  [facility] and going to methadone clinic to help with her substance abuse concerns.  Pt will be giving birth soon- as soon as next Tuesday- and states that [facility] will not accept her back after she gives birth- pt does not have a place to go at this time once she gives birth.  Pt is very concerned this will affect her ability to keep her baby after birth though CPS is already involved and she is unsure if they will be automatically taking the child from her regardless.    Pt has been trying to apply for multiple rehab facilities over the past week so that she has a safe place to go.  Talking with [facility] as well as several other options.  CSW left message for [facility] as pt states they need documentation from [place] to finish application- awaiting return call.  Pt reports no support system that could assist her with the baby or her own needs- states [facility] doesn't acknowledge paternity and will not assist.  Pt also needs help with getting medical documentation to her methadone clinic.  CSW called methadone clinic to get fax number and left message- awaiting return call.                                      HRT/VAS Care Coordination    Patients Home Cardiology Office Heart Failure Clinic   Outpatient Care Team Social Worker   Social Worker Name: Kelly [name]- [facility]- [phone]   Living arrangements for the past 2 months --  [facility]   Lives with: Roommate   Patient Current Insurance  Coverage Medicaid   Patient Has Concern With Paying Medical Bills No   Does Patient Have Prescription Coverage? Yes   Home Assistive Devices/Equipment None   DME Agency NA   HH Agency NA      Social History:                                                                             SDOH Screenings   Alcohol Screen: Low Risk   . Last Alcohol Screening Score (AUDIT): 0  Depression (PHQ2-9): Medium Risk  .  PHQ-2 Score: 16  Financial Resource Strain: High Risk  . Difficulty of Paying Living Expenses: Very hard  Food Insecurity: No Food Insecurity  . Worried About Charity fundraiser in the Last Year: Never true  . Ran Out of Food in the Last Year: Never true  Housing: High Risk  . Last Housing Risk Score: 4  Physical Activity: Not on file  Social Connections: Not on file  Stress: Not on file  Tobacco Use: Medium Risk  . Smoking Tobacco Use: Former Smoker  . Smokeless Tobacco Use: Never Used  Transportation Needs: Unmet Transportation Needs  . Lack of Transportation (Medical): Yes  . Lack of Transportation (Non-Medical): Yes    SDOH Interventions: Financial Resources:   disability income  Food Insecurity:   not currently  Housing Insecurity:   pending homelessness  Transportation:    not assessed at this time    Other Care Navigation Interventions:     Inpatient/Outpatient Substance Abuse Counseling/Rehab Options Working on residential rehab options for pt.   Follow-up plan:    CSW to follow up with rehab centers regarding what medical documentation they need.  Pt to continue applying for options and work with HF team to get them what they need.  CSW to speak with [facility] staff [name] or Kelly [name] [phone]) to see if they can provide some transition time after the birth of the baby.  CSW will continue to follow and assist as needed  Kelly [name], [place] [facility] Desk#: [phone] Cell#: [phone]

## 2020-09-22 NOTE — Progress Notes (Signed)
PRENATAL VISIT NOTE  Subjective:  Kelly [name] is a 35 y.o. [phone] at 29w2dbeing seen today for ongoing prenatal care.  She is currently monitored for the following issues for this high-risk pregnancy and has COLONIC POLYPS; CROHN'S DISEASE; Thoracic back pain; Opioid use disorder, severe, dependence (HAma; Unspecified episodic mood disorder; ADHD (attention deficit hyperactivity disorder); Cocaine abuse (HPrattville; Polysubstance abuse (HAlma; No prenatal care in current pregnancy in third trimester; Chronic hypertension during pregnancy, antepartum; History of cesarean delivery affecting pregnancy; Major depressive disorder, recurrent episode with anxious distress (HCoffey; Bipolar disorder (HEsperanza; Difficult intravenous access; IVDU (intravenous drug user); Degenerative arthritis of lumbar spine; 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); Tricuspid regurgitation; Rubella non-immune status, antepartum; Supervision of other high risk pregnancies, third trimester; Preexisting hypertension complicating pregnancy, antepartum; Dental caries; Phobia of dental procedure; Endocarditis; History of VBAC; Infection due to Streptococcus mitis group; Chronic systolic heart failure (HAlbany; Unwanted fertility; MDD (major depressive disorder), recurrent severe, without psychosis (HR[place]; Oth psychoactive substance abuse w mood disorder (HElma Center; MRSA bacteremia; Acute on chronic congestive heart failure (HPerris; Cellulitis of right lower extremity; Septic embolism (HBiggs; Syphilis; Drug use affecting pregnancy in second trimester; Methadone maintenance treatment affecting pregnancy (HPorter Heights; Maternal endocarditis complicating pregnancy in third trimester; Pain, dental; History of hepatitis C; Scoliosis; Congenital heart defect; Septic pulmonary embolism (HCambridge; Severe sepsis (HJunction City; Acute on chronic systolic CHF (congestive heart failure) (HCedarville; Severe pulmonary hypertension (HSanta Maria; Endocarditis of tricuspid  valve; Tricuspid valve regurgitation; Prolonged QT interval; C. difficile diarrhea; and Syphilis affecting pregnancy in third trimester on their problem list.  Patient reports no complaints.  Contractions: Irregular. Vag. Bleeding: None,Scant.  Movement: Present. Denies leaking of fluid.   The following portions of the patient's history were reviewed and updated as appropriate: allergies, current medications, past family history, past medical history, past social history, past surgical history and problem list.   Objective:   Vitals:   09/22/20 1337  BP: 102/79  Pulse: 84  Weight: 198 lb 12.8 oz (90.2 kg)    Fetal Status: Fetal Heart Rate (bpm): NST   Movement: Present  Presentation: Vertex  General:  Alert, oriented and cooperative. Patient is in no acute distress.  Skin: Skin is warm and dry. No rash noted.   Cardiovascular: Normal heart rate noted  Respiratory: Normal respiratory effort, no problems with respiration noted  Abdomen: Soft, gravid, appropriate for gestational age.  Pain/Pressure: Present     Pelvic: Cervical exam performed in the presence of a chaperone Dilation: 3.5 Effacement (%): 70 Station: -3  Extremities: Normal range of motion.     Mental Status: Normal mood and affect. Normal behavior. Normal judgment and thought content.   Assessment and Plan:  Pregnancy: [id number] 343w2d. Supervision of other high risk pregnancies, third trimester Routine care. Pt set up for 5/19 induction. NST today reactive: 135 baseline, +accel, no decel, mod variability toco quiet x 2545m10: ceph, afi 13, bpp 8/8, 2526gm, 21%, ac 34% - GC/Chlamydia probe amp ([place])[facility]  2. Chronic hypertension affecting pregnancy Followed by cardiology and doing well just on lasix qday She had an appt with them earlier today and exam and echo looks stable. I will send them an inbasket message about any special instructions during her labor  3. History of cesarean  delivery affecting pregnancy See below  4. C. difficile diarrhea Finish vancomycin  5. Syphilis affecting pregnancy in third trimester I see she was dx on 07/15/20  with titer of 1:32. Last RPR was 06/20/2018 and it was non-reactive. She was only given a one time PCN dose in March 2022 of 2.4 million units x 1. I d/w her re: this and she was aware of the dx.  I d/w MFM after patient had left and ultrasounds look normal and Dr. Donalee Citrin is in agreement to have patient be treated with pregnancy dosing.  Will have patient come on 5/13 for 1.2 million units IM and repeat this qwk x 3. I called her about this and she will come back on 5/13 to pick up her postpartum housing application and do the PCN and rpt RPR then.   6. Methadone maintenance treatment affecting pregnancy in third trimester ([place]) No issues  7. History of hepatitis C No issues  8. Unwanted fertility BTL papers UTD and pt confirms she still wants it  62. Acute bacterial endocarditis No issues  10. History of VBAC Desires rpt VBAC  11. Preexisting hypertension complicating pregnancy, antepartum  Preterm labor symptoms and general obstetric precautions including but not limited to vaginal bleeding, contractions, leaking of fluid and fetal movement were reviewed in detail with the patient. Please refer to After Visit Summary for other counseling recommendations.   Return in about 3 weeks (around 10/13/2020) for in person, md visit.  Future Appointments  Date Time Provider [place]  09/26/2020  1:50 PM [facility] NURSE [facility]  09/27/2020 10:15 AM [facility]-BEHAVIORAL HEALTH CLINICIAN [facility]  09/27/2020 12:00 PM MC-SCREENING MC-SDSC None  09/29/2020  9:25 AM MC-LD SCHED ROOM MC-INDC None  09/29/2020  1:15 PM W[facility]-WOCA NST [name]  09/29/2020  2:15 PM Aletha Halim, MD [facility]  10/06/2020  1:15 PM [facility] NST [facility]  10/07/2020 10:00 AM WMC-WOCA LAB [name]  10/13/2020  1:15 PM [facility]-W[facility] NST [facility]   10/20/2020  1:15 PM W[facility]-W[facility] NST [facility]  11/28/2020  3:20 PM Bensimhon, Shaune Pascal, MD MC-HVSC None    Aletha Halim, MD

## 2020-09-22 NOTE — Progress Notes (Signed)
  Echocardiogram 2D Echocardiogram has been performed.  Kelly [name] 09/22/2020, 11:14 AM

## 2020-09-22 NOTE — Progress Notes (Signed)
[place] for growth/BPP done 09/20/20.  Pt had cardiology appt today. Pt reports that the diarrhea has stopped. [facility] appt is scheduled on 5/17.

## 2020-09-23 ENCOUNTER — Other Ambulatory Visit: Payer: Medicaid Other

## 2020-09-23 ENCOUNTER — Ambulatory Visit: Payer: Medicaid Other

## 2020-09-23 ENCOUNTER — Telehealth (HOSPITAL_COMMUNITY): Payer: Self-pay | Admitting: *Deleted

## 2020-09-23 ENCOUNTER — Telehealth (HOSPITAL_COMMUNITY): Payer: Self-pay | Admitting: Licensed Clinical Social Worker

## 2020-09-23 ENCOUNTER — Other Ambulatory Visit: Payer: Self-pay | Admitting: Obstetrics and Gynecology

## 2020-09-23 DIAGNOSIS — O98113 Syphilis complicating pregnancy, third trimester: Secondary | ICD-10-CM

## 2020-09-23 LAB — GC/CHLAMYDIA PROBE AMP (~~LOC~~) NOT AT ARMC
Chlamydia: NEGATIVE
Comment: NEGATIVE
Comment: NORMAL
Neisseria Gonorrhea: NEGATIVE

## 2020-09-23 NOTE — Telephone Encounter (Signed)
CSW called case worker with [facility] [phone], to discuss pt discharge from the program after the birth of the baby.  Megan directed CSW to their president, Gwenlyn Perking, who [place] emailed in regards to the current situation.  Per Gwenlyn Perking they would be willing to allow pt to return temporarily to [facility] if she had an admission date to another program confirmed.  CSW called pt to inform of above and discuss progress with her applications to other rehabs- unable to reach- left VM requesting return call.  CSW followed up with pt methadone clinic regarding requested documentation and got fax number to send EKG- sent message to clinic RN with this information so they could send required documents.  CSW also attempted to reach [facility] rehab staff again to find out what medical documentation they needed from [place] but had to leave another VM.  Will continue to follow and assist as needed  [name], [place] [facility] Desk#: [phone] Cell#: [phone]

## 2020-09-23 NOTE — Telephone Encounter (Signed)
Preadmission screen  

## 2020-09-25 LAB — STREP GP B NAA: Strep Gp B NAA: NEGATIVE

## 2020-09-26 ENCOUNTER — Telehealth (HOSPITAL_COMMUNITY): Payer: Self-pay | Admitting: *Deleted

## 2020-09-26 ENCOUNTER — Ambulatory Visit (INDEPENDENT_AMBULATORY_CARE_PROVIDER_SITE_OTHER): Payer: Self-pay

## 2020-09-26 ENCOUNTER — Telehealth (HOSPITAL_COMMUNITY): Payer: Self-pay | Admitting: Licensed Clinical Social Worker

## 2020-09-26 ENCOUNTER — Other Ambulatory Visit: Payer: Self-pay

## 2020-09-26 ENCOUNTER — Ambulatory Visit (INDEPENDENT_AMBULATORY_CARE_PROVIDER_SITE_OTHER): Payer: Medicaid Other | Admitting: Obstetrics and Gynecology

## 2020-09-26 ENCOUNTER — Encounter (HOSPITAL_COMMUNITY): Payer: Self-pay | Admitting: *Deleted

## 2020-09-26 ENCOUNTER — Ambulatory Visit (INDEPENDENT_AMBULATORY_CARE_PROVIDER_SITE_OTHER): Payer: Medicaid Other

## 2020-09-26 VITALS — BP 113/79 | HR 81 | Wt 202.1 lb

## 2020-09-26 VITALS — BP 96/74 | HR 91 | Ht 67.5 in | Wt 202.9 lb

## 2020-09-26 DIAGNOSIS — O98113 Syphilis complicating pregnancy, third trimester: Secondary | ICD-10-CM

## 2020-09-26 DIAGNOSIS — Z98891 History of uterine scar from previous surgery: Secondary | ICD-10-CM

## 2020-09-26 DIAGNOSIS — Z3A37 37 weeks gestation of pregnancy: Secondary | ICD-10-CM | POA: Insufficient documentation

## 2020-09-26 DIAGNOSIS — O09893 Supervision of other high risk pregnancies, third trimester: Secondary | ICD-10-CM

## 2020-09-26 DIAGNOSIS — F339 Major depressive disorder, recurrent, unspecified: Secondary | ICD-10-CM

## 2020-09-26 DIAGNOSIS — F1120 Opioid dependence, uncomplicated: Secondary | ICD-10-CM

## 2020-09-26 DIAGNOSIS — F1990 Other psychoactive substance use, unspecified, uncomplicated: Secondary | ICD-10-CM

## 2020-09-26 DIAGNOSIS — Z3A36 36 weeks gestation of pregnancy: Secondary | ICD-10-CM

## 2020-09-26 DIAGNOSIS — O99323 Drug use complicating pregnancy, third trimester: Secondary | ICD-10-CM

## 2020-09-26 DIAGNOSIS — F1410 Cocaine abuse, uncomplicated: Secondary | ICD-10-CM

## 2020-09-26 MED ORDER — PENICILLIN G BENZATHINE 1200000 UNIT/2ML IM SUSY
1.2000 10*6.[IU] | PREFILLED_SYRINGE | Freq: Once | INTRAMUSCULAR | Status: AC
  Administered 2020-09-26: 1.2 10*6.[IU] via INTRAMUSCULAR

## 2020-09-26 NOTE — Telephone Encounter (Signed)
Preadmission screen  

## 2020-09-26 NOTE — Telephone Encounter (Signed)
CSW continuing to work on assisting pt with finding placement for after she gives birth.  Attempted to call [facility] again- left messages on two different VM lines- no response at this time  CSW calling local shelters to discuss admission requirements/ availability:  [facility]- has 12-15 week waitlist- would need pt to call in to get on list  [facility]- provided with pt contact information they will need to assess for possible placement  [employer]- first come first serve basis  [employer]- pt would need to call to complete intake  [facility]- pt would need to call to complete intake and discuss eligibility  [facility]- pt would need to call for intake   Will continue to follow and assist as needed  [name], [place] [facility] Desk#: [phone] Cell#: [phone]

## 2020-09-26 NOTE — Progress Notes (Signed)
Pt here today for follow up labs and to get paperwork filled out by Dr Ilda Basset. Pt aware of results from RPR and will receive bicillin 1.2 MU today IM. Pt verbalized understanding and is agreeable to plan of care.   Paperwork given back to pt. +FM  Pt to see Dr Elgie Congo today for MD follow up and NST with [facility] today with Diane. Pt agreeable to plan of care.   Colletta [place], RN  09/26/20.

## 2020-09-26 NOTE — Progress Notes (Signed)
PRENATAL VISIT NOTE  Subjective:  Kelly [name] is a 35 y.o. [phone] at 55w6dbeing seen today for ongoing prenatal care.  She is currently monitored for the following issues for this high-risk pregnancy and has COLONIC POLYPS; CROHN'S DISEASE; Thoracic back pain; Opioid use disorder, severe, dependence (HBotkins; Unspecified episodic mood disorder; ADHD (attention deficit hyperactivity disorder); Cocaine abuse (HYalaha; Polysubstance abuse (HFreeville; No prenatal care in current pregnancy in third trimester; Chronic hypertension during pregnancy, antepartum; History of cesarean delivery affecting pregnancy; Major depressive disorder, recurrent episode with anxious distress (HWomens Bay; Bipolar disorder (HMilan; Difficult intravenous access; IVDU (intravenous drug user); Degenerative arthritis of lumbar spine; 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); Tricuspid regurgitation; Rubella non-immune status, antepartum; Supervision of other high risk pregnancies, third trimester; Preexisting hypertension complicating pregnancy, antepartum; Dental caries; Phobia of dental procedure; Endocarditis; History of VBAC; Infection due to Streptococcus mitis group; Chronic systolic heart failure (HHyattsville; Unwanted fertility; MDD (major depressive disorder), recurrent severe, without psychosis (HAmelia; Oth psychoactive substance abuse w mood disorder (HGallaway; MRSA bacteremia; Acute on chronic congestive heart failure (HNew Carlisle; Cellulitis of right lower extremity; Septic embolism (HAtglen; Syphilis; Drug use affecting pregnancy in second trimester; Methadone maintenance treatment affecting pregnancy (HPueblito del Carmen; Maternal endocarditis complicating pregnancy in third trimester; Pain, dental; History of hepatitis C; Scoliosis; Congenital heart defect; Septic pulmonary embolism (HAppleby; Severe sepsis (HWimer; Acute on chronic systolic CHF (congestive heart failure) (HFuig; Severe pulmonary hypertension (HDenton; Endocarditis of tricuspid  valve; Tricuspid valve regurgitation; Prolonged QT interval; C. difficile diarrhea; Syphilis affecting pregnancy in third trimester; and [redacted] weeks gestation of pregnancy on their problem list.  Patient doing well with no acute concerns today. She reports pelvic pressure.  Contractions: Irregular. Vag. Bleeding: None.  Movement: Present. Denies leaking of fluid.   The following portions of the patient's history were reviewed and updated as appropriate: allergies, current medications, past family history, past medical history, past social history, past surgical history and problem list. Problem list updated.  Objective:   Vitals:   09/26/20 1440  BP: 113/79  Pulse: 81  Weight: 202 lb 1.6 oz (91.7 kg)    Fetal Status: Fetal Heart Rate (bpm): 140 Fundal Height: 35 cm Movement: Present  Presentation: Vertex  General:  Alert, oriented and cooperative. Patient is in no acute distress.  Skin: Skin is warm and dry. No rash noted.   Cardiovascular: Normal heart rate noted  Respiratory: Normal respiratory effort, no problems with respiration noted  Abdomen: Soft, gravid, appropriate for gestational age.  Pain/Pressure: Present     Pelvic: Cervical exam performed Dilation: 3 Effacement (%): 70 Station: Ballotable  Extremities: Normal range of motion.     Mental Status:  Normal mood and affect. Normal behavior. Normal judgment and thought content.   Assessment and Plan:  Pregnancy: [id number] 344w6d1. [redacted] weeks gestation of pregnancy   2. Cocaine abuse (HCBurlingtonBPP today - US[place] BPP W/NONSTRESS; Future  3. History of VBAC IOL on 5/19  4. IVDU (intravenous drug user)  - [place] BPP W/NONSTRESS; Future  5. 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 Major depressive disorder, recurrent episode with anxious distress (HCC)   7. Opioid use disorder, severe, dependence (HCMerrifield - USKoreaETAL BPP W/NONSTRESS;  Future  8. Supervision of other high risk pregnancies, third trimester  - US[place] BPP W/NONSTRESS; Future  9. Syphilis affecting pregnancy in third trimester Pt treated, peds made aware - [place]  FETAL BPP W/NONSTRESS; Future  Term labor symptoms and general obstetric precautions including but not limited to vaginal bleeding, contractions, leaking of fluid and fetal movement were reviewed in detail with the patient.  Please refer to After Visit Summary for other counseling recommendations.   No follow-ups on file.   Lynnda Shields, MD Faculty Attending Center for [name]

## 2020-09-27 ENCOUNTER — Ambulatory Visit (INDEPENDENT_AMBULATORY_CARE_PROVIDER_SITE_OTHER): Payer: Medicaid Other | Admitting: Clinical

## 2020-09-27 ENCOUNTER — Other Ambulatory Visit (HOSPITAL_COMMUNITY)
Admission: RE | Admit: 2020-09-27 | Discharge: 2020-09-27 | Disposition: A | Discharge status: Home / Self Care | Payer: Medicaid Other | Source: Ambulatory Visit | Attending: Obstetrics & Gynecology | Admitting: Obstetrics & Gynecology

## 2020-09-27 ENCOUNTER — Encounter: Payer: Self-pay | Admitting: Internal Medicine

## 2020-09-27 ENCOUNTER — Encounter: Payer: Self-pay | Admitting: Obstetrics and Gynecology

## 2020-09-27 ENCOUNTER — Telehealth (HOSPITAL_COMMUNITY): Payer: Self-pay | Admitting: Licensed Clinical Social Worker

## 2020-09-27 DIAGNOSIS — Z20822 Contact with and (suspected) exposure to covid-19: Secondary | ICD-10-CM | POA: Diagnosis not present

## 2020-09-27 DIAGNOSIS — F1190 Opioid use, unspecified, uncomplicated: Secondary | ICD-10-CM

## 2020-09-27 DIAGNOSIS — Z01812 Encounter for preprocedural laboratory examination: Secondary | ICD-10-CM | POA: Diagnosis not present

## 2020-09-27 DIAGNOSIS — Z8659 Personal history of other mental and behavioral disorders: Secondary | ICD-10-CM

## 2020-09-27 LAB — SARS CORONAVIRUS 2 (TAT 6-24 HRS): SARS Coronavirus 2: NEGATIVE

## 2020-09-27 NOTE — Progress Notes (Signed)
Cardiology  Agree  with telemetry.   Standard SBE antibiotic prophylaxis is fine.   Pierre Bali, MD

## 2020-09-27 NOTE — Telephone Encounter (Addendum)
CSW continuing to assist pt in finding options for shelter or rehab.  Was able to speak with someone at [facility] rehab regarding pt coming for treatment.  Pt would need to retain custody of the child at [place] in order to be eligible which is unsure at this time so they would need to confirm with the pt CPS worker what the plan is for the child before discussing admission further.  CSW passed along [employer] workers number Langley [phone]) to pt to provide to her CPS worker.  CSW confirmed with staff at [facility] that medically pt could participate in groups and complete ADLs despite her medical conditions so would not need special accommodations at rehab.  CSW assisting in calling alternative maternal programs:  -[facility]- pt has already spoken to and is to call after the baby is born and inform if baby will be in her custody at [place]- will require baby be with the pt in order to admit  -[facility]- unable to accept patient as she has been clean too long- require drug use within the past 30 days to admit  -[facility]- pt has zoom interview tomorrow with them  [facility]- pt on waitlist but unsure how long that will take  -[facility] an application for intake- CSW to forward to pt for completion  -the [employer]- pt submitted application today  Also assisted in calling other rehab facility options to assess if they would accept pt if she was without her child at [place]:  [facility]- cannot take until after 6 week postpartum period.  Will continue to follow and assist as needed  [name], [place] [facility] Desk#: [phone] Cell#: [phone]

## 2020-09-27 NOTE — Progress Notes (Signed)
OB Note L&D recommendations -run MIVF at 75/hr total -operative vaginal delivery to minimize pushing  Please call cardiology on call to clarify the below issues, as I haven't heard back from Cardiology regarding them, as of 09/27/20 -?telemetry in labor and/or postpartum -?SBE abx prophylaxis needed    Aletha Halim, MD  Bensimhon, Shaune Pascal, MD Sounds great and we'll probably do a vacuum or forceps to help expedite delivery.   What antibiotics would you like and when? Thanks   Kourtnie Sachs        Previous Messages   ----- Message -----  From: Jolaine Artist, MD  Sent: 09/23/2020 11:57 AM EDT  To: Aletha Halim, MD  Subject: RE: any special instructions for L&D and pos*   Lets run her at 75/hr. No special instructions except for abx and the fact that she cannot have prolonged labor. IF requires a lot of pushing will need to switch to section. But based on last delivery I doubt this will be an issue for her.   ----- Message -----  From: Aletha Halim, MD  Sent: 09/23/2020  7:07 AM EDT  To: Jolaine Artist, MD  Subject: any special instructions for L&D and postpar*   Do you want her on telemetry for L&D and during postpartum?   I would assume you want to keep her dry. Typically we have patients on 125/hr of IVF during labor but we can saline lock her or do it at lower rate if you'd like   Any other special instructions and do y'all want to know about her when she's admitted? Plan is for induction on thursdy 5/19.   Thanks for the help   Durene Romans MD  Attending  Center for [employer] (Faculty Practice)  09/23/2020

## 2020-09-27 NOTE — Patient Instructions (Signed)
[facility] for [facility] [place], [place] [address] [phone] (main office) [phone] ([place] office)     BRAINSTORMING  Develop a Plan Goals: . Provide a way to start conversation about your new life with a baby . Assist parents in recognizing and using resources within their reach . Help pave the way before birth for an easier period of transition afterwards.  Make a list of the following information to keep in a central location: . Full name of Mom and Partner: _____________________________________________ . 80 full name and Date of Birth: ___________________________________________ . Home Address: ___________________________________________________________ ________________________________________________________________________ . Home Phone: ____________________________________________________________ . Parents' cell numbers: _____________________________________________________ ________________________________________________________________________ . Name and contact info for OB: ______________________________________________ . Name and contact info for Pediatrician:________________________________________ . Contact info for Lactation Consultants: ________________________________________  REST and SLEEP *You each need at least 4-5 hours of uninterrupted sleep every day. Write specific names and contact information.* . How are you going to rest in the postpartum period? While partner's home? When partner returns to work? When you both return to work? Marland Kitchen Where will your baby sleep? Marland Kitchen Who is available to help during the day? Evening? Night? . Who could move in for a period to help support you? Marland Kitchen What are some ideas to help you get enough  sleep? __________________________________________________________________________________________________________________________________________________________________________________________________________________________________________ NUTRITIOUS FOOD AND DRINK *Plan for [name] before your baby is born so you can have healthy food to eat during the immediate postpartum period.* . Who will look after breakfast? Lunch? Dinner? List names and contact information. Brainstorm quick, healthy ideas for each meal. . What can you do before baby is born to prepare [name] for the postpartum period? . How can others help you with [name]? Marland Kitchen Which grocery stores provide online shopping and delivery? Marland Kitchen Which restaurants offer take-out or delivery options? ______________________________________________________________________________________________________________________________________________________________________________________________________________________________________________________________________________________________________________________________________________________________________________________________________  CARE FOR MOM *It's important that mom is cared for and pampered in the postpartum period. Remember, the most important ways new mothers need care are: sleep, nutrition, gentle exercise, and time off.* . Who can come take care of mom during this period? Make a list of people with their contact information. . List some activities that make you feel cared for, rested, and energized? Who can make sure you have opportunities to do these things? . Does mom have a space of her very own within your home that's just for her? Make a "[facility]" where she can be comfortable, rest, and renew herself  daily. ______________________________________________________________________________________________________________________________________________________________________________________________________________________________________________________________________________________________________________________________________________________________________________________________________    CARE FOR AND FEEDING BABY *Knowledgeable and encouraging people will offer the best support with regard to feeding your baby.* . Educate yourself and choose the best feeding option for your baby. . Make a list of people who will guide, support, and be a resource for you as your care for and feed your baby. (Friends that have breastfed or are currently breastfeeding, lactation consultants, breastfeeding support groups, etc.) . Consider a postpartum doula. (These websites can give you information: dona.org & BuyingShow.es) . Seek out local breastfeeding resources like the breastfeeding support group at [employer] or [employer]. ______________________________________________________________________________________________________________________________________________________________________________________________________________________________________________________________________________________________________________________________________________________________________________________________________  Kelly [name] AND ERRANDS . Who can help with a thorough cleaning before baby is born? . Make a list of people who will help with housekeeping and chores, like laundry, light cleaning, dishes, bathrooms, etc. . Who can run some errands for you? Marland Kitchen What can you do to make sure you are stocked with basic supplies before baby is born? . Who is going to do the  shopping? ______________________________________________________________________________________________________________________________________________________________________________________________________________________________________________________________________________________________________________________________________________________________________________________________________     Family Adjustment *Nurture yourselves.it helps parents be more loving and allows for better bonding with their child.* . What sorts of things do you and partner enjoy doing together? Which activities help you to connect and strengthen your relationship? Make a list of those things. Make a list of people whom you trust to care for your baby so you can have some time together as a couple. . What types of things help partner feel connected to Mom? Make a list. . What needs will partner have in order to bond with baby? . Other children? Who will care for them when you go into labor and while you are in the hospital? . Think about what the needs of your older children might be. Who can help you meet those needs? In what ways are you helping them prepare for bringing baby home? List some specific strategies you have for family adjustment. _______________________________________________________________________________________________________________________________________________________________________________________________________________________________________________________________________________________________________________________________________________  SUPPORT *Someone who can empathize with experiences normalizes your problems and makes them more bearable.* . Make a list of other friends, neighbors, and/or co-workers you know with infants (and small children, if applicable) with whom you can connect. . Make a list of local or online support groups, mom groups, etc. in which you can be  involved. ______________________________________________________________________________________________________________________________________________________________________________________________________________________________________________________________________________________________________________________________________________________________________________________________________  Childcare Plans . Investigate and plan for childcare if mom is returning to work. . Talk about mom's concerns about her transition back to work. . Talk about partner's concerns regarding this transition.  Mental Health *Your mental health is one of the highest priorities for a pregnant or postpartum mom.* . 1 in 5 women experience anxiety and/or depression from the time of conception through the first year after birth. . Postpartum Mood Disorders are the #1 complication of pregnancy and childbirth and the suffering experienced by these mothers is not necessary! These illnesses are temporary and respond well to treatment, which often includes self-care, social support, talk therapy, and medication when needed. . Women experiencing anxiety and depression often say things like: "I'm supposed to be happy.why do I feel so sad?", "Why can't I snap out of it?", "I'm having thoughts that scare me." . There is no need to be embarrassed if you are feeling these symptoms: o Overwhelmed, anxious, angry, sad, guilty, irritable, hopeless, exhausted but can't sleep o You are NOT alone. You are NOT to blame. With help, you WILL be well. . Where can I find help? Medical professionals such as your OB, midwife, gynecologist, family practitioner, primary care provider, pediatrician, or mental health providers; [facility] support groups: Feelings After Birth, Breastfeeding Support Group, Baby and Me Group, and Fit 4 Two exercise classes. . You have permission to ask for help. It will confirm your feelings, validate your  experiences, share/learn coping strategies, and gain support and encouragement as you heal. You are important! BRAINSTORM . Make a list of local resources, including resources for mom and for partner. . Identify support groups. . Identify people to call late at night - include names and contact info. . Talk with partner about perinatal mood and anxiety disorders. . Talk with your OB, midwife, and doula about baby blues and about perinatal mood and anxiety disorders. . Talk with your pediatrician about perinatal mood and anxiety disorders.   Support & Sanity Savers   What do you really need?  . Basics . In preparing for a new baby, many expectant parents spend hours shopping for baby clothes, decorating the nursery, and deciding which car seat to  buy. Yet most don't think much about what the reality of parenting a newborn will be like, and what they need to make it through that. So, here is the advice of experienced parents. We know you'll read this, and think "they're exaggerating, I don't really need that." Just trust [place] on these, OK? Plan for all of this, and if it turns out you don't need it, come back and teach [place] how you did it!  Kelly [name] (Once baby's survival needs are met, make sure you attend to your own survival needs!) . Sleep . An average newborn sleeps 16-18 hours per day, over 6-7 sleep periods, rarely more than three hours at a time. It is normal and healthy for a newborn to wake throughout the night... but really hard on parents!! . Naps. Prioritize sleep above any responsibilities like: cleaning house, visiting friends, running errands, etc.  Sleep whenever baby sleeps. If you can't nap, at least have restful times when baby eats. The more rest you get, the more patient you will be, the more emotionally stable, and better at solving problems.  . Food . You may not have realized it would be difficult to eat when you have a newborn. Yet, when we talk to . countless new  parents, they say things like "it may be 2:00 pm when I realize I haven't had breakfast yet." Or "every time we sit down to dinner, baby needs to eat, and my food gets cold, so I don't bother to eat it." . Finger food. Before your baby is born, stock up with one months' worth of food that: 1) you can eat with one hand while holding a baby, 2) doesn't need to be prepped, 3) is good hot or cold, 4) doesn't spoil when left out for a few hours, and 5) you like to eat. Think about: nuts, dried fruit, Clif bars, pretzels, jerky, gogurt, baby carrots, apples, bananas, crackers, cheez-n-crackers, string cheese, hot pockets or frozen burritos to microwave, garden burgers and breakfast pastries to put in the toaster, yogurt drinks, etc. . Restaurant Menus. Make lists of your favorite restaurants & menu items. When family/friends want to help, you can give specific information without much thought. They can either bring you the food or send gift cards for just the right [name]. Kelly [name].  Take some time to make a few [name] to put in the freezer ahead of time.  Easy to freeze [name] can be anything such as soup, lasagna, chicken pie, or spaghetti sauce. . Set up a Meal Schedule.  Ask friends and family to sign up to bring you [name] during the first few weeks of being home. (It can be passed around at baby showers!) You have no idea how helpful this will be until you are in the throes of parenting.  https://hamilton-woodard.com/ is a great website to check out. . Emotional Support . Know who to call when you're stressed out. Parenting a newborn is very challenging work. There are times when it totally overwhelms your normal coping abilities. EVERY NEW PARENT NEEDS TO HAVE A PLAN FOR WHO TO CALL WHEN THEY JUST CAN'T COPE ANY MORE. (And it has to be someone other than the baby's other parent!) Before your baby is born, come up with at least one person you can call for support - write their phone number down and post it on the  refrigerator. Marland Kitchen Anxiety & Sadness. Baby blues are normal after pregnancy; however, there are more severe types of anxiety &  sadness which can occur and should not be ignored.  They are always treatable, but you have to take the first step by reaching out for help. [facility] offers a "Mom Talk" group which meets every Tuesday from 10 am - 11 am.  This group is for new moms who need support and connection after their babies are born.  Call [phone].  Marland Kitchen Really, Really Helpful (Plan for them! Make sure these happen often!!) . Physical Support with Taking Care of Yourselves . Asking friends and family. Before your baby is born, set up a schedule of people who can come and visit and help out (or ask a friend to schedule for you). Any time someone says "let me know what I can do to help," sign them up for a day. When they get there, their job is not to take care of the baby (that's your job and your joy). Their job is to take care of you!  . Postpartum doulas. If you don't have anyone you can call on for support, look into postpartum doulas:  professionals at helping parents with caring for baby, caring for themselves, getting breastfeeding started, and helping with household tasks. www.padanc.org is a helpful website for learning about doulas in our area. . Peer Support / Parent Groups . Why: One of the greatest ideas for new parents is to be around other new parents. Parent groups give you a chance to share and listen to others who are going through the same season of life, get a sense of what is normal infant development by watching several babies learn and grow, share your stories of triumph and struggles with empathetic ears, and forgive your own mistakes when you realize all parents are learning by trial and error. . Where to find: There are many places you can meet other new parents throughout our community.  [facility] offers the following classes for new moms and their little ones:  Baby  and Me (Birth to [place]) and Breastfeeding Support Group. Go to www.conehealthybaby.com or call [phone] for more information. . Time for your Relationship . It's easy to get so caught up in meeting baby's immediate needs that it's hard to find time to connect with your partner, and meet the needs of your relationship. It's also easy to forget what "quality time with your partner" actually looks like. If you take your baby on a date, you'd be amazed how much of your couple time is spent feeding the baby, diapering the baby, admiring the baby, and talking about the baby. . Dating: Try to take time for just the two of you. Babysitter tip: Sometimes when moms are breastfeeding a newborn, they find it hard to figure out how to schedule outings around baby's unpredictable feeding schedules. Have the babysitter come for a three hour period. When she comes over, if baby has just eaten, you can leave right away, and come back in two hours. If baby hasn't fed recently, you start the date at home. Once baby gets hungry and gets a good feeding in, you can head out for the rest of your date time. . Date Nights at Home: If you can't get out, at least set aside one evening a week to prioritize your relationship: whenever baby dozes off or doesn't have any immediate needs, spend a little time focusing on each other. . Potential conflicts: The main relationship conflicts that come up for new parents are: issues related to sexuality, financial stresses, a feeling of an unfair division  of household tasks, and conflicts in parenting styles. The more you can work on these issues before baby arrives, the better!  Kelly [name] (Don't forget these. and don't feel guilty for indulging in them!) . Everyone has something in life that is a fun little treat that they do just for themselves. It may be: reading the morning paper, or going for a daily jog, or having coffee with a friend once a week, or going to a movie on Friday  nights, or fine chocolates, or bubble baths, or curling up with a good book. . Unless you do fun things for yourself every now and then, it's hard to have the energy for fun with your baby. Whatever your "special" treats are, make sure you find a way to continue to indulge in them after your baby is born. These special moments can recharge you, and allow you to return to baby with a new joy   PERINATAL MOOD DISORDERS: Kelly [name]   Emergency and Crisis Resources:  If you are an imminent risk to self or others, are experiencing intense personal distress, and/or have noticed significant changes in activities of daily living, call:  . 911 . [facility]: [phone] . Mobile Crisis: [phone] . National Suicide Hotline: 774-769-4848 Or visit the following crisis centers: . Local Emergency Departments . [facility]: [address], [place]. Hours: 8:30AM-5PM. Insurance Accepted: Medicaid, Medicare, and Uninsured.  Marland Kitchen [facility]  [address], [place] Mon-Friday 8am-3pm  [phone]                                                                                    Non-Crisis Resources: To identify specific providers that are covered by your insurance, contact your insurance company or local agencies: [place]: [phone] [facility] and [place]: [phone] [place]: [phone] Postpartum Support International- [place] 1-786-256-7186                                                      Outpatient therapy and medication management providers:  [facility] [phone] Hours: 9AM-5PM  Insurance Accepted: Alben Spittle, Lorella Nimrod, Freddrick March, [place], Medicare  [name] ([place]) [phone] Hours: 8AM-5PM  nsurance Accepted: All insurances EXCEPT AARP, [place], [place], and  [place]: [phone]             Hours: 8AM-8PM Insurance Accepted: Cristal [place], Freddrick March, [place], Medicare, [phone] [facility]: [phone] Hours: 8:30AM-7PM Insurance Accepted: Cristal [place], Medicaid, Medicare, Tricare, [employer] Counseling:  [place] Accepted:  Holland Falling, Lorella Nimrod, [employer], [employer], [employer] [phone] Hours: 9AM-5:30PM Insurance Accepted: Alben Spittle, Charlotte Crumb, and Medicaid, Medicare, [employer] [facility] Counsel[facility]:  [phone] Hours: 9am-5pm Insurance Accepted: BCBS; they do not accept Medicaid/Medicare  [facility]: [phone] Hours: 9am-9pm Insurance Accepted: All major insurance including Medicaid and Medicare [facility]: [phone] Hours: 9AM-5:30PM Insurance Accepted: All insurances EXCEPT Medicaid and Medicare. [facility]: [place]: [phone] [place]:  [place] (support for children in the NICU and/or with special needs), [place] [employer]: [phone]                                                                                     Online Resources: Postpartum Support International: http://jones-berg.com/  800-944-4PPD 2Moms Supporting Moms:  www.momssupportingmoms.net

## 2020-09-28 ENCOUNTER — Telehealth: Payer: Self-pay | Admitting: Clinical

## 2020-09-28 ENCOUNTER — Telehealth (HOSPITAL_COMMUNITY): Payer: Self-pay | Admitting: Licensed Clinical Social Worker

## 2020-09-28 ENCOUNTER — Other Ambulatory Visit: Payer: Self-pay | Admitting: Advanced Practice Midwife

## 2020-09-28 NOTE — Telephone Encounter (Signed)
Brief phone check, as agreed-upon; pt is aware she has at least two potential treatment options postpartum, [facility]; is aware that Glenard Haring, [facility], will check on her upon inpatient admission. Pt is feeling nervous about the uncertainty regarding housing for herself and baby, but remains hopeful.

## 2020-09-28 NOTE — Telephone Encounter (Signed)
Pt accepted into the [facility] as long as she is able to retain custody or temporary custody of the child at DC- they can help establish with new methadone clinic in their area and have sent a letter to [facility] to inform they plan on accepting patient at DC.  Pt has also submitted application with [facility] who can accept pt if she has custody of the child or not- she is awaiting to hear if they can accept her.  CSW sent updates to inpatient [facility] CSW, Elmyra Ricks, who will help to get to assigned CSW when pt comes in for induction tomorrow so they can assist from inpatient side in following up regarding DC.  CSW will continue to follow in outpatient setting and assist as needed  [name], [facility] Desk#: [phone] Cell#: [phone]

## 2020-09-28 NOTE — Telephone Encounter (Signed)
CSW spoke with pt this morning to discuss following updates:  [facility] does not anticipate openings until mid June  -pt was able to speak with someone at [facility] which is a year long residential program in [place]- would take her with or without retaining custody of the child- pt is filling out application and submitting it today  - Called the [facility]- [phone]- where pt submitted application yesterday- was able to confirm that they have application and that they have a bed available they will be calling the patient to complete intake over the phone and faxing CSW further paperwork for pt to sign- will need orders for pt medications when they know DC meds and will provide [place] with local methadone clinic information to get pt established with clinic- they state they they can take patient with the baby if CPS grants temporary custody of the child for while she's in rehab treatment  - Left message for Derrill Kay who oversees all of the perinatal residential programs [phone]- awaiting return call to discuss if there are other options available  Will continue to follow and assist as needed  [name], [place] Worker [facility] Desk#: [phone] Cell#: [phone]

## 2020-09-29 ENCOUNTER — Encounter: Payer: Self-pay | Admitting: Obstetrics and Gynecology

## 2020-09-29 ENCOUNTER — Encounter (HOSPITAL_COMMUNITY): Payer: Self-pay | Admitting: Obstetrics and Gynecology

## 2020-09-29 ENCOUNTER — Other Ambulatory Visit: Payer: Self-pay

## 2020-09-29 ENCOUNTER — Inpatient Hospital Stay (HOSPITAL_COMMUNITY)
Admission: AD | Admit: 2020-09-29 | Discharge: 2020-10-05 | DRG: 796 | Disposition: A | Discharge status: Home / Self Care | Payer: Medicaid Other | Attending: Obstetrics & Gynecology | Admitting: Obstetrics & Gynecology

## 2020-09-29 ENCOUNTER — Inpatient Hospital Stay (HOSPITAL_COMMUNITY): Payer: Medicaid Other

## 2020-09-29 DIAGNOSIS — K5090 Crohn's disease, unspecified, without complications: Secondary | ICD-10-CM | POA: Diagnosis present

## 2020-09-29 DIAGNOSIS — Z8619 Personal history of other infectious and parasitic diseases: Secondary | ICD-10-CM | POA: Diagnosis not present

## 2020-09-29 DIAGNOSIS — O34219 Maternal care for unspecified type scar from previous cesarean delivery: Secondary | ICD-10-CM | POA: Diagnosis present

## 2020-09-29 DIAGNOSIS — Z3A37 37 weeks gestation of pregnancy: Secondary | ICD-10-CM

## 2020-09-29 DIAGNOSIS — Z302 Encounter for sterilization: Secondary | ICD-10-CM

## 2020-09-29 DIAGNOSIS — Z87891 Personal history of nicotine dependence: Secondary | ICD-10-CM | POA: Diagnosis not present

## 2020-09-29 DIAGNOSIS — I5023 Acute on chronic systolic (congestive) heart failure: Secondary | ICD-10-CM | POA: Diagnosis present

## 2020-09-29 DIAGNOSIS — R9431 Abnormal electrocardiogram [ECG] [EKG]: Secondary | ICD-10-CM | POA: Diagnosis present

## 2020-09-29 DIAGNOSIS — A0472 Enterocolitis due to Clostridium difficile, not specified as recurrent: Secondary | ICD-10-CM | POA: Diagnosis present

## 2020-09-29 DIAGNOSIS — A0471 Enterocolitis due to Clostridium difficile, recurrent: Secondary | ICD-10-CM | POA: Diagnosis not present

## 2020-09-29 DIAGNOSIS — F1420 Cocaine dependence, uncomplicated: Secondary | ICD-10-CM | POA: Diagnosis present

## 2020-09-29 DIAGNOSIS — O10919 Unspecified pre-existing hypertension complicating pregnancy, unspecified trimester: Secondary | ICD-10-CM | POA: Diagnosis not present

## 2020-09-29 DIAGNOSIS — O99344 Other mental disorders complicating childbirth: Secondary | ICD-10-CM | POA: Diagnosis present

## 2020-09-29 DIAGNOSIS — F419 Anxiety disorder, unspecified: Secondary | ICD-10-CM | POA: Diagnosis present

## 2020-09-29 DIAGNOSIS — F1120 Opioid dependence, uncomplicated: Secondary | ICD-10-CM | POA: Diagnosis present

## 2020-09-29 DIAGNOSIS — I071 Rheumatic tricuspid insufficiency: Secondary | ICD-10-CM | POA: Diagnosis present

## 2020-09-29 DIAGNOSIS — I110 Hypertensive heart disease with heart failure: Secondary | ICD-10-CM | POA: Diagnosis present

## 2020-09-29 DIAGNOSIS — F319 Bipolar disorder, unspecified: Secondary | ICD-10-CM | POA: Diagnosis present

## 2020-09-29 DIAGNOSIS — B9561 Methicillin susceptible Staphylococcus aureus infection as the cause of diseases classified elsewhere: Secondary | ICD-10-CM | POA: Diagnosis present

## 2020-09-29 DIAGNOSIS — O99324 Drug use complicating childbirth: Secondary | ICD-10-CM | POA: Diagnosis present

## 2020-09-29 DIAGNOSIS — I079 Rheumatic tricuspid valve disease, unspecified: Secondary | ICD-10-CM | POA: Diagnosis not present

## 2020-09-29 DIAGNOSIS — O9942 Diseases of the circulatory system complicating childbirth: Secondary | ICD-10-CM | POA: Diagnosis present

## 2020-09-29 DIAGNOSIS — O98113 Syphilis complicating pregnancy, third trimester: Secondary | ICD-10-CM | POA: Diagnosis not present

## 2020-09-29 DIAGNOSIS — F909 Attention-deficit hyperactivity disorder, unspecified type: Secondary | ICD-10-CM | POA: Diagnosis present

## 2020-09-29 DIAGNOSIS — Z20822 Contact with and (suspected) exposure to covid-19: Secondary | ICD-10-CM | POA: Diagnosis present

## 2020-09-29 DIAGNOSIS — O9962 Diseases of the digestive system complicating childbirth: Secondary | ICD-10-CM | POA: Diagnosis present

## 2020-09-29 DIAGNOSIS — R197 Diarrhea, unspecified: Secondary | ICD-10-CM | POA: Diagnosis not present

## 2020-09-29 DIAGNOSIS — O99323 Drug use complicating pregnancy, third trimester: Secondary | ICD-10-CM

## 2020-09-29 DIAGNOSIS — I330 Acute and subacute infective endocarditis: Secondary | ICD-10-CM | POA: Diagnosis not present

## 2020-09-29 DIAGNOSIS — O99413 Diseases of the circulatory system complicating pregnancy, third trimester: Secondary | ICD-10-CM | POA: Diagnosis present

## 2020-09-29 DIAGNOSIS — Z79899 Other long term (current) drug therapy: Secondary | ICD-10-CM

## 2020-09-29 LAB — COMPREHENSIVE METABOLIC PANEL
ALT: 8 U/L (ref 0–44)
AST: 18 U/L (ref 15–41)
Albumin: 2.7 g/dL — ABNORMAL LOW (ref 3.5–5.0)
Alkaline Phosphatase: 90 U/L (ref 38–126)
Anion gap: 8 (ref 5–15)
BUN: 13 mg/dL (ref 6–20)
CO2: 26 mmol/L (ref 22–32)
Calcium: 9 mg/dL (ref 8.9–10.3)
Chloride: 100 mmol/L (ref 98–111)
Creatinine, Ser: 0.65 mg/dL (ref 0.44–1.00)
GFR, Estimated: >60 mL/min (ref >60)
Glucose, Bld: 92 mg/dL (ref 70–99)
Potassium: 4.1 mmol/L (ref 3.5–5.1)
Sodium: 134 mmol/L — ABNORMAL LOW (ref 135–145)
Total Bilirubin: 0.4 mg/dL (ref 0.3–1.2)
Total Protein: 6.9 g/dL (ref 6.5–8.1)

## 2020-09-29 LAB — CBC
HCT: 31.9 % — ABNORMAL LOW (ref 36.0–46.0)
Hemoglobin: 10.4 g/dL — ABNORMAL LOW (ref 12.0–15.0)
MCH: 30.4 pg (ref 26.0–34.0)
MCHC: 32.6 g/dL (ref 30.0–36.0)
MCV: 93.3 fL (ref 80.0–100.0)
Platelets: 314 10*3/uL (ref 150–400)
RBC: 3.42 MIL/uL — ABNORMAL LOW (ref 3.87–5.11)
RDW: 16.2 % — ABNORMAL HIGH (ref 11.5–15.5)
WBC: 11.7 10*3/uL — ABNORMAL HIGH (ref 4.0–10.5)
nRBC: 0 % (ref 0.0–0.2)

## 2020-09-29 LAB — TYPE AND SCREEN
ABO/RH(D): O POS
Antibody Screen: NEGATIVE

## 2020-09-29 MED ORDER — CEFAZOLIN SODIUM-DEXTROSE 2-4 GM/100ML-% IV SOLN
2.0000 g | INTRAVENOUS | Status: AC
  Administered 2020-09-30: 2 g via INTRAVENOUS
  Filled 2020-09-29: qty 100

## 2020-09-29 MED ORDER — LACTATED RINGERS IV SOLN: 500.0000 mL | Freq: Once | INTRAVENOUS | Status: DC

## 2020-09-29 MED ORDER — FENTANYL-BUPIVACAINE-NACL 0.5-0.125-0.9 MG/250ML-% EP SOLN
12.0000 mL/h | EPIDURAL | Status: DC | PRN
  Administered 2020-09-30: 12 mL/h via EPIDURAL
  Filled 2020-09-29: qty 250

## 2020-09-29 MED ORDER — FUROSEMIDE 40 MG PO TABS
40.0000 mg | ORAL_TABLET | Freq: Every day | ORAL | Status: DC
  Administered 2020-09-30 – 2020-10-05 (×6): 40 mg via ORAL
  Filled 2020-09-29 (×8): qty 1

## 2020-09-29 MED ORDER — SERTRALINE HCL 50 MG PO TABS
50.0000 mg | ORAL_TABLET | Freq: Every day | ORAL | Status: DC
  Administered 2020-09-30 – 2020-10-05 (×6): 50 mg via ORAL
  Filled 2020-09-29 (×7): qty 1

## 2020-09-29 MED ORDER — SOD CITRATE-CITRIC ACID 500-334 MG/5ML PO SOLN: 30.0000 mL | ORAL | Status: DC | PRN

## 2020-09-29 MED ORDER — LIDOCAINE HCL (PF) 1 % IJ SOLN: 30.0000 mL | INTRAMUSCULAR | Status: DC | PRN

## 2020-09-29 MED ORDER — DIPHENHYDRAMINE HCL 50 MG/ML IJ SOLN: 12.5000 mg | INTRAMUSCULAR | Status: DC | PRN

## 2020-09-29 MED ORDER — PHENYLEPHRINE 40 MCG/ML (10ML) SYRINGE FOR IV PUSH (FOR BLOOD PRESSURE SUPPORT)
80.0000 ug | PREFILLED_SYRINGE | INTRAVENOUS | Status: DC | PRN
  Filled 2020-09-29: qty 10

## 2020-09-29 MED ORDER — LACTATED RINGERS IV SOLN: INTRAVENOUS | Status: DC

## 2020-09-29 MED ORDER — LACTATED RINGERS IV SOLN
500.0000 mL | INTRAVENOUS | Status: DC | PRN
  Administered 2020-09-30: 500 mL via INTRAVENOUS

## 2020-09-29 MED ORDER — FENTANYL CITRATE (PF) 100 MCG/2ML IJ SOLN
125.0000 ug | Freq: Once | INTRAMUSCULAR | Status: AC
  Administered 2020-09-29: 125 ug via INTRAVENOUS
  Filled 2020-09-29: qty 4

## 2020-09-29 MED ORDER — NALOXONE HCL 0.4 MG/ML IJ SOLN: 0.4000 mg | INTRAMUSCULAR | Status: DC | PRN

## 2020-09-29 MED ORDER — OXYTOCIN-SODIUM CHLORIDE 30-0.9 UT/500ML-% IV SOLN
2.5000 [IU]/h | INTRAVENOUS | Status: DC
  Administered 2020-09-30: 30 [IU] via INTRAVENOUS
  Filled 2020-09-29: qty 500

## 2020-09-29 MED ORDER — ACETAMINOPHEN 325 MG PO TABS: 650.0000 mg | ORAL_TABLET | ORAL | Status: DC | PRN

## 2020-09-29 MED ORDER — OXYTOCIN BOLUS FROM INFUSION: 333.0000 mL | Freq: Once | INTRAVENOUS | Status: DC

## 2020-09-29 MED ORDER — EPHEDRINE 5 MG/ML INJ: 10.0000 mg | INTRAVENOUS | Status: DC | PRN

## 2020-09-29 MED ORDER — METHADONE HCL 10 MG PO TABS: 140.0000 mg | ORAL_TABLET | Freq: Every day | ORAL | Status: DC

## 2020-09-29 MED ORDER — OXYTOCIN-SODIUM CHLORIDE 30-0.9 UT/500ML-% IV SOLN
1.0000 m[IU]/min | INTRAVENOUS | Status: DC
Start: 2020-09-29 — End: 2020-09-30
  Administered 2020-09-29: 2 m[IU]/min via INTRAVENOUS
  Filled 2020-09-29: qty 500

## 2020-09-29 MED ORDER — FENTANYL CITRATE (PF) 100 MCG/2ML IJ SOLN
100.0000 ug | INTRAMUSCULAR | Status: DC | PRN
  Administered 2020-09-29 – 2020-09-30 (×6): 100 ug via INTRAVENOUS
  Filled 2020-09-29 (×7): qty 2

## 2020-09-29 MED ORDER — METHADONE HCL 10 MG/ML PO CONC
140.0000 mg | Freq: Every day | ORAL | Status: DC
  Administered 2020-09-30: 140 mg via ORAL
  Filled 2020-09-29: qty 14

## 2020-09-29 MED ORDER — PHENYLEPHRINE 40 MCG/ML (10ML) SYRINGE FOR IV PUSH (FOR BLOOD PRESSURE SUPPORT)
80.0000 ug | PREFILLED_SYRINGE | INTRAVENOUS | Status: DC | PRN
  Administered 2020-09-30: 80 ug via INTRAVENOUS

## 2020-09-29 NOTE — Progress Notes (Signed)
Labor Progress Note Kelly [name] is a 35 y.o. [phone] at 90w2dpresented for IOL for CHF S: Rates pain 6/10, denies feeling pressure   O:  BP 102/65   Pulse 69   Temp 97.7 F (36.5 C) (Oral)   Resp 18  EFM: baseline 130/moderate variability/+accels/no decels  CVE: Dilation: 4 Effacement (%): 50 Station: Ballotable,-3 Presentation: Vertex Exam by:: JCharmayne Sheer MD   A&P: 35y.o. [id number] 374w2dor IOL for CHF, TOLAC with hx VBAC x2 #TOLAC: Latent. Oxytocin at 12 mu/min. Can still feel bag of water and baby ballotable, will attempt AROM at next exam.  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 Notify peds.   JuJanet BerlinMD 9:07 PM

## 2020-09-29 NOTE — Progress Notes (Addendum)
Labor Progress Note Kelly [name] is a 35 y.o. [id number] at 10w2dpresented for IOL for CHF S: Feeling contractions every few minutes. No other concerns. Amenable to trying AROM.  O:  BP 108/75   Pulse 83   Temp 98.4 F (36.9 C) (Oral)   Resp 18  EFM: baseline 130/moderate variability/+accels/no decels  CVE: Dilation: 4 Effacement (%): 60 Station: -2 Presentation: Vertex Exam by:: Kelly [name] CNM   A&P: 35y.o. [id number] IOL for CHF, TOLAC with hx VBAC x2 #TOLAC: Latent. Oxytocin at 10 mu/min. Now s/p AROM at 1726 with scant clear fluid. Anticipate VBAC. #Pain: IV fentanyl prn, can have epidural if desired #FWB: cat I #GBS negative #history of endocarditis: IV cefazolin 30-60 min prior to delivery for SBE ppx #QTc prolongation: most recent QTc wnl, continue cardiac monitoring, avoid QT prolonging agents #CHF: euvolemic, continue home furosemide #rubella non-immune status: MMR postpartum  RiZola [name] 5:30 PM    Midwife attestation I agree with the documentation in the resident's note.   Kelly HandlerCNM 6:14 PM

## 2020-09-29 NOTE — H&P (Addendum)
OBSTETRIC ADMISSION HISTORY AND PHYSICAL  Kelly [name] is a 35 y.o. female [phone] with IUP at 53w2dby 26w UKoreapresenting for IOL for CHF secondary to severe tricuspid regurgitation (given history of endocarditis and IVDU). She reports +FMs, No LOF, no VB, no blurry vision, headaches. Denies chest pain, SOB. Minimal lower extremity edema.  She plans on breast and bottle feeding. She request BTL for birth control (consent signed 3/25). 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 Diff colitis - treated with vancomycin History of VBAC - two successful VBAC after CS Rubella non-immune status  Past Medical History: Past Medical History:  Diagnosis Date  . 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 Abscess of breast    rt breast  . Abscess of right breast 07/13/2014  . Acute cystitis without hematuria   . ADD (attention deficit disorder)   . Back pain   . Bacteremia due to Enterococcus 06/15/2018  . Bipolar 1 disorder ([facility]  . Endocarditis 2020  . Foreign body granuloma of soft tissue, NEC, right upper arm   . History of hepatitis C 10/01/2018  . Hypertension   . Influenza B 06/17/2018  . Leukocytosis   . Multifocal pneumonia 06/14/2018  . Polysubstance  abuse ([facility]  . Pyelonephritis affecting pregnancy 05/24/2016  . Sepsis ([facility]7/22/2019  . Su[facility] ([facility]    Past Surgical History: Past Surgical History:  Procedure Laterality Date  . BACK SURGERY     scoliosis-in care everywhere 08/1999  . CESAREAN SECTION    . CHOLECYSTECTOMY  March 2015  . FOREIGN BODY REMOVAL Right 05/28/2016   Procedure: REMOVAL FOREIGN BODY RIGHT ARM;  Surgeon: DeMeredith PelMD;  [place] [facility] Service: Orthopedics;  Laterality: Right;  . IRRIGATION AND DEBRIDEMENT ABSCESS Right 07/13/2014   Procedure: IRRIGATION AND DEBRIDEMEN TRIGHT BREAST ABSCESS;  Surgeon: MaDonnie MesaMD;  [place] Service: General;  Laterality: Right;  . LUMBAR LAMINECTOMY/DECOMPRESSION MICRODISCECTOMY N/A 12/05/2017   Procedure: LUMBAR LAMINECTOMY FOR EPIDURAL ABSCESS;  Surgeon: JeNewman PiesMD;  Location: [facility] Service: Neurosurgery;  Laterality: N/A;  . MULTIPLE EXTRACTIONS WITH ALVEOLOPLASTY Bilateral 07/03/2018   Procedure: Extraction of tooth #'s 30 and 31 with alveoloplasty;  Surgeon: KuLenn CalDDS;  Location: [facility] Service: Oral Surgery;  Laterality: Bilateral;  . RADIOLOGY WITH ANESTHESIA N/A 12/05/2017   Procedure: MRI WITH [facility]HESIA OF [facility]INE WITH AND WITHOUT;  Surgeon: Radiologist, Medication, MD;  Location: [facility] Service: Radiology;  Laterality: N/A;  . RIGHT/LEFT HEART CATH AND CORONARY ANGIOGRAPHY N/A 05/21/2019   Procedure: RIGHT/LEFT HEART CATH AND CORONARY ANGIOGRAPHY;  Surgeon: BeJolaine ArtistMD;  [place] [facility];  Service: Cardiovascular;  Laterality: N/A;  .  TEE WITHOUT CARDIOVERSION N/A 05/21/2019   Procedure: TRANSESOPHAGEAL ECHOCARDIOGRAM (TEE);  Surgeon: Jolaine Artist, MD;  Location: [facility] ENDOSCOPY;  Service: Cardiovascular;  Laterality: N/A;    Obstetrical History: OB History    Gravida  8   Para  3   Term  3   Preterm  0   AB  3   Living  3     SAB  2   IAB  1    Ectopic  0   Multiple  0   Live Births  3           Social History Social History   Socioeconomic History  . Marital status: Single    Spouse name: Not on file  . Number of children: Not on file  . Years of education: Not on file  . Highest education level: Not on file  Occupational History  . Occupation: unemployed  Tobacco Use  . Smoking status: Former Smoker    Packs/day: 1.00    Years: 13.00    Pack years: 13.00    Types: Cigarettes    Quit date: 06/13/2018    Years since quitting: 2.2  . Smokeless tobacco: Never Used  Vaping Use  . Vaping Use: Never used  Substance and Sexual Activity  . Alcohol use: Not Currently    Comment: one or two drinks weekly  . Drug use: Yes    Types: Cocaine, Heroin, IV, Fentanyl    Comment: heroin, crack cocaine, fentanyl last used today (07/13/20)   . Sexual activity: Yes    Birth control/protection: Condom  Other Topics Concern  . Not on file  Social History Narrative  . Not on file   Social Determinants of Health   Financial Resource Strain: High Risk  . Difficulty of Paying Living Expenses: Very hard  Food Insecurity: No Food Insecurity  . Worried About Charity fundraiser in the Last Year: Never true  . Ran Out of Food in the Last Year: Never true  Transportation Needs: Unmet Transportation Needs  . Lack of Transportation (Medical): Yes  . Lack of Transportation (Non-Medical): Yes  Physical Activity: Not on file  Stress: Not on file  Social Connections: Not on file    Family History: Family History  Problem Relation Age of Onset  . Diabetes Mother   . Diabetes Father   . Hypertension Father   . Heart attack Neg Hx   . Hyperlipidemia Neg Hx   . Sudden death Neg Hx     Allergies: Allergies  Allergen Reactions  . Hydrocodone Itching  . Morphine And Related Nausea And Vomiting    Medications Prior to Admission  Medication Sig Dispense Refill Last Dose  . furosemide (LASIX) 40 MG tablet Take 1 tablet (40 mg  total) by mouth daily. 30 tablet 2   . loperamide (IMODIUM) 2 MG capsule Take 1 capsule (2 mg total) by mouth 4 (four) times daily as needed for diarrhea or loose stools. 30 capsule 1   . methadone (DOLOPHINE) 10 MG/5ML solution Take 140 mg by mouth daily.     . prenatal vitamin w/FE, FA (NATACHEW) 29-1 MG CHEW chewable tablet Chew 1 tablet by mouth daily at 12 noon. 30 tablet 2   . promethazine (PHENERGAN) 25 MG tablet Take 1 tablet (25 mg total) by mouth every 6 (six) hours as needed for nausea or vomiting. 30 tablet 0   . sertraline (ZOLOFT) 50 MG tablet Take 1 tablet (50 mg total) by mouth daily. [place]  tablet 1      Review of Systems   All systems reviewed and negative except as stated in HPI  Blood pressure 100/70, pulse 96, temperature 97.6 F (36.4 C), temperature source Axillary, resp. rate 16, not currently breastfeeding. General appearance: alert, cooperative, appears stated age and no distress Lungs: clear to auscultation bilaterally Heart: regular rate and rhythm, 2/6 systolic murmur Abdomen: soft, non-tender Extremities: trace BLE edema Presentation: cephalic Fetal monitoringBaseline: 125 bpm, Variability: Good {> 6 bpm), Accelerations: Reactive and Decelerations: Absent Uterine activityNone   Prenatal labs: ABO, Rh: --/--/O POS (05/19 1043) Antibody: NEG (05/19 1043) Rubella:   RPR: Reactive (03/04 0416)  HBsAg: NON REACTIVE (03/15 0900)  HIV: Non Reactive (03/02 1952)  GBS: Negative/-- (05/12 1453)  2 hr Glucola wnl Genetic screening  Not done Anatomy US normal  Prenatal Transfer Tool  Maternal Diabetes: No Genetic Screening: not performed (too late) Maternal Ultrasounds/Referrals: Normal Fetal Ultrasounds or other Referrals:  None Maternal Substance Abuse:  Yes:  Type: Smoker, Cocaine, Methadone, Other: alcohol, heroin; denies use since 3/2 (except still on methadone)  Significant Maternal Medications:  Meds include: Zoloft Other: furosemide,  methadone Significant Maternal Lab Results: Group B Strep negative and Other: syphilis (positive RPR and T pallidum Abs, treated with IM PCN)  Results for orders placed or performed during the hospital encounter of 09/29/20 (from the past 24 hour(s))  CBC   Collection Time: 09/29/20 10:43 AM  Result Value Ref Range   WBC 11.7 (H) 4.0 - 10.5 K/uL   RBC 3.42 (L) 3.87 - 5.11 MIL/uL   Hemoglobin 10.4 (L) 12.0 - 15.0 g/dL   HCT 31.9 (L) 36.0 - 46.0 %   MCV 93.3 80.0 - 100.0 fL   MCH 30.4 26.0 - 34.0 pg   MCHC 32.6 30.0 - 36.0 g/dL   RDW 16.2 (H) 11.5 - 15.5 %   Platelets 314 150 - 400 K/uL   nRBC 0.0 0.0 - 0.2 %  Type and screen   Collection Time: 09/29/20 10:43 AM  Result Value Ref Range   ABO/RH(D) O POS    Antibody Screen NEG    Sample Expiration      10/02/2020,2359 Performed at [name], [address], [place], [place] [address]     Patient Active Problem List   Diagnosis Date Noted  . Chronic hypertension affecting pregnancy 09/29/2020  . [redacted] weeks gestation of pregnancy 09/26/2020  . Syphilis affecting pregnancy in third trimester 09/23/2020  . C. difficile diarrhea 09/14/2020  . Septic pulmonary embolism ([place]) 08/24/2020  . Severe sepsis ([place]) 08/24/2020  . Acute on chronic systolic CHF (congestive heart failure) ([place]) 08/24/2020  . Severe pulmonary hypertension ([place]) 08/24/2020  . Endocarditis of tricuspid valve 08/24/2020  . Tricuspid valve regurgitation 08/24/2020  . Prolonged QT interval 08/24/2020  . Pain, dental   . Maternal endocarditis complicating pregnancy in third trimester   . Methadone maintenance treatment affecting pregnancy ([place]) 07/21/2020  . Drug use affecting pregnancy in second trimester   . Septic embolism ([place])   . Syphilis   . Cellulitis of right lower extremity   . MRSA bacteremia 07/13/2020  . Acute on chronic congestive heart failure ([place]) 07/13/2020  . MDD (major depressive disorder), recurrent severe, without psychosis ([place])  06/26/2019  . Oth psychoactive substance abuse w mood disorder ([place]) 06/26/2019  . Congenital heart defect 10/07/2018  . History of hepatitis C 10/01/2018  . Unwanted fertility 09/04/2018  . Chronic systolic heart failure ([place]) 08/29/2018  .  Infection due to Streptococcus mitis group 07/20/2018  . History of VBAC 07/16/2018  . Endocarditis 07/12/2018  . Dental caries 07/03/2018  . 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 Preexisting hypertension complicating pregnancy, antepartum 06/29/2018  . Supervision of other high risk pregnancies, third trimester 06/27/2018  . Rubella non-immune status, antepartum 06/23/2018  . 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 Tricuspid regurgitation 06/14/2018  . Difficult intravenous access   . IVDU (intravenous drug user)   . Bipolar disorder ([place]) 07/31/2016  . Major depressive disorder, recurrent episode with anxious distress ([place]) 07/29/2016  . Polysubstance abuse ([place]) 05/25/2016  . No prenatal care in current pregnancy in third trimester 05/25/2016  . Chronic hypertension during pregnancy, antepartum 05/25/2016  . History of cesarean delivery affecting pregnancy 05/25/2016  . Opioid use disorder, severe, dependence ([place]) 08/25/2013  . Unspecified episodic mood disorder 08/25/2013  . ADHD (attention deficit hyperactivity disorder) 08/25/2013  . Cocaine abuse ([place]) 08/25/2013  . Thoracic back pain 01/29/2013  . Scoliosis 11/17/2012  . CROHN'S DISEASE 05/03/2005  . COLONIC POLYPS 02/28/2003    Assessment/Plan:  AMBREA HEGLER is a 35 y.o. [phone] at 71w2dhere for IOL for CHF secondary to severe tricuspid regurgitation (history of endocarditis and IVDU)  #IOL: Patient desires TOLAC. Cervix favorable, start oxytocin (started 1250). Low threshold for VAVD to avoid maternal exhaustion given CHF. #Pain: Epidural if desired #FWB: Category  I #ID:  Cefazolin prior to delivery for SBE ppx #MOF: breast and bottle #MOC:BTL (signed papers 08/05/20) #Circ:  Desired #CHF: appears euvolemic on exam, continue home furosemide 40 mg daily #history of endocarditis: abx as above for ppx #QTc prolongation: methadone likely contributing, most recent QTc 440 wnl on 5/12. cardiac monitoring, avoid QT prolonging agents #SUD: continue methadone, naloxone prn, patient planning for inpatient rehab postpartum #bipolar disorder: continue home sertraline, plan for CSW consult postpartum  RZola Button MD  09/29/2020, 12:53 PM   Midwife attestation: I have seen and examined this patient; I agree with above documentation in the resident's note.   PE: Gen: calm comfortable, NAD Resp: normal effort and rate Abd: gravid  ROS, labs, PMH reviewed  Assessment/Plan: [redacted] weeks gestation Labor: latent FWB: Cat I GBS: neg Admit to LD Pitocin IOL TOLAC, anticipate [place] CNM  09/29/2020, 2:37 PM

## 2020-09-30 ENCOUNTER — Encounter (HOSPITAL_COMMUNITY): Payer: Self-pay | Admitting: Obstetrics and Gynecology

## 2020-09-30 ENCOUNTER — Inpatient Hospital Stay (HOSPITAL_COMMUNITY): Payer: Medicaid Other | Admitting: Certified Registered Nurse Anesthetist

## 2020-09-30 ENCOUNTER — Encounter (HOSPITAL_COMMUNITY)
Admission: AD | Disposition: A | Discharge status: Home / Self Care | Payer: Self-pay | Source: Home / Self Care | Attending: Obstetrics & Gynecology

## 2020-09-30 ENCOUNTER — Other Ambulatory Visit: Payer: Self-pay

## 2020-09-30 ENCOUNTER — Inpatient Hospital Stay (HOSPITAL_COMMUNITY): Payer: Medicaid Other | Admitting: Anesthesiology

## 2020-09-30 DIAGNOSIS — I071 Rheumatic tricuspid insufficiency: Secondary | ICD-10-CM

## 2020-09-30 DIAGNOSIS — O98113 Syphilis complicating pregnancy, third trimester: Secondary | ICD-10-CM

## 2020-09-30 DIAGNOSIS — O34219 Maternal care for unspecified type scar from previous cesarean delivery: Secondary | ICD-10-CM

## 2020-09-30 DIAGNOSIS — O10919 Unspecified pre-existing hypertension complicating pregnancy, unspecified trimester: Secondary | ICD-10-CM

## 2020-09-30 DIAGNOSIS — B9561 Methicillin susceptible Staphylococcus aureus infection as the cause of diseases classified elsewhere: Secondary | ICD-10-CM

## 2020-09-30 DIAGNOSIS — Z8619 Personal history of other infectious and parasitic diseases: Secondary | ICD-10-CM

## 2020-09-30 DIAGNOSIS — A0471 Enterocolitis due to Clostridium difficile, recurrent: Secondary | ICD-10-CM

## 2020-09-30 DIAGNOSIS — Z3A37 37 weeks gestation of pregnancy: Secondary | ICD-10-CM

## 2020-09-30 DIAGNOSIS — F1120 Opioid dependence, uncomplicated: Secondary | ICD-10-CM

## 2020-09-30 DIAGNOSIS — I330 Acute and subacute infective endocarditis: Secondary | ICD-10-CM

## 2020-09-30 DIAGNOSIS — Z302 Encounter for sterilization: Secondary | ICD-10-CM

## 2020-09-30 HISTORY — PX: TUBAL LIGATION: SHX77

## 2020-09-30 LAB — RPR
RPR Ser Ql: REACTIVE — AB
RPR Titer: 1:4 {titer}

## 2020-09-30 SURGERY — LIGATION, FALLOPIAN TUBE, POSTPARTUM
Anesthesia: Epidural | Laterality: Bilateral

## 2020-09-30 MED ORDER — DEXMEDETOMIDINE (PRECEDEX) IN NS 20 MCG/5ML (4 MCG/ML) IV SYRINGE
PREFILLED_SYRINGE | INTRAVENOUS | Status: AC
  Filled 2020-09-30: qty 5

## 2020-09-30 MED ORDER — WITCH HAZEL-GLYCERIN EX PADS: 1.0000 "application " | MEDICATED_PAD | CUTANEOUS | Status: DC | PRN

## 2020-09-30 MED ORDER — LACTATED RINGERS IV SOLN: INTRAVENOUS | Status: DC | PRN

## 2020-09-30 MED ORDER — ONDANSETRON HCL 4 MG/2ML IJ SOLN: 4.0000 mg | Freq: Once | INTRAMUSCULAR | Status: DC

## 2020-09-30 MED ORDER — LIDOCAINE HCL (PF) 2 % IJ SOLN
INTRAMUSCULAR | Status: AC
  Filled 2020-09-30: qty 5

## 2020-09-30 MED ORDER — ACETAMINOPHEN 325 MG PO TABS
650.0000 mg | ORAL_TABLET | ORAL | Status: DC | PRN
  Administered 2020-09-30 (×2): 650 mg via ORAL
  Filled 2020-09-30 (×2): qty 2

## 2020-09-30 MED ORDER — PHENYLEPHRINE HCL (PRESSORS) 10 MG/ML IV SOLN
INTRAVENOUS | Status: DC | PRN
  Administered 2020-09-30: 80 ug via INTRAVENOUS

## 2020-09-30 MED ORDER — IBUPROFEN 600 MG PO TABS
600.0000 mg | ORAL_TABLET | Freq: Four times a day (QID) | ORAL | Status: DC
  Administered 2020-09-30 – 2020-10-01 (×3): 600 mg via ORAL
  Filled 2020-09-30 (×3): qty 1

## 2020-09-30 MED ORDER — METHADONE HCL 10 MG/ML PO CONC
10.0000 mg | ORAL | Status: AC
  Administered 2020-09-30: 10 mg via ORAL
  Filled 2020-09-30: qty 1

## 2020-09-30 MED ORDER — PROPOFOL 10 MG/ML IV BOLUS
INTRAVENOUS | Status: AC
  Filled 2020-09-30: qty 20

## 2020-09-30 MED ORDER — SUCCINYLCHOLINE CHLORIDE 200 MG/10ML IV SOSY
PREFILLED_SYRINGE | INTRAVENOUS | Status: AC
  Filled 2020-09-30: qty 10

## 2020-09-30 MED ORDER — HYDROMORPHONE HCL 1 MG/ML IJ SOLN
INTRAMUSCULAR | Status: AC
  Filled 2020-09-30: qty 0.5

## 2020-09-30 MED ORDER — TRANEXAMIC ACID-NACL 1000-0.7 MG/100ML-% IV SOLN: 1000.0000 mg | INTRAVENOUS | Status: AC

## 2020-09-30 MED ORDER — PRENATAL MULTIVITAMIN CH
1.0000 | ORAL_TABLET | Freq: Every day | ORAL | Status: DC
  Administered 2020-09-30 – 2020-10-03 (×3): 1 via ORAL
  Filled 2020-09-30 (×3): qty 1

## 2020-09-30 MED ORDER — BENZOCAINE-MENTHOL 20-0.5 % EX AERO: 1.0000 "application " | INHALATION_SPRAY | CUTANEOUS | Status: DC | PRN

## 2020-09-30 MED ORDER — COCONUT OIL OIL: 1.0000 "application " | TOPICAL_OIL | Status: DC | PRN

## 2020-09-30 MED ORDER — TETANUS-DIPHTH-ACELL PERTUSSIS 5-2.5-18.5 LF-MCG/0.5 IM SUSY: 0.5000 mL | PREFILLED_SYRINGE | Freq: Once | INTRAMUSCULAR | Status: DC

## 2020-09-30 MED ORDER — LIDOCAINE-EPINEPHRINE 2 %-1:100000 IJ SOLN: INTRAMUSCULAR | Status: DC | PRN

## 2020-09-30 MED ORDER — IBUPROFEN 600 MG PO TABS: 600.0000 mg | ORAL_TABLET | Freq: Four times a day (QID) | ORAL | Status: DC

## 2020-09-30 MED ORDER — LIDOCAINE-EPINEPHRINE (PF) 2 %-1:200000 IJ SOLN: INTRAMUSCULAR | Status: DC | PRN

## 2020-09-30 MED ORDER — LORAZEPAM 2 MG/ML IJ SOLN
1.0000 mg | Freq: Once | INTRAMUSCULAR | Status: AC
  Administered 2020-09-30: 1 mg via INTRAVENOUS
  Filled 2020-09-30: qty 0.5

## 2020-09-30 MED ORDER — DEXMEDETOMIDINE (PRECEDEX) IN NS 20 MCG/5ML (4 MCG/ML) IV SYRINGE
PREFILLED_SYRINGE | INTRAVENOUS | Status: DC | PRN
  Administered 2020-09-30: 12 ug via INTRAVENOUS
  Administered 2020-09-30: 8 ug via INTRAVENOUS
  Administered 2020-09-30: 12 ug via INTRAVENOUS
  Administered 2020-09-30 (×3): 8 ug via INTRAVENOUS

## 2020-09-30 MED ORDER — SIMETHICONE 80 MG PO CHEW
80.0000 mg | CHEWABLE_TABLET | ORAL | Status: DC | PRN
  Administered 2020-10-02: 80 mg via ORAL
  Filled 2020-09-30: qty 1

## 2020-09-30 MED ORDER — SENNOSIDES-DOCUSATE SODIUM 8.6-50 MG PO TABS
2.0000 | ORAL_TABLET | ORAL | Status: DC
  Filled 2020-09-30 (×3): qty 2

## 2020-09-30 MED ORDER — MISOPROSTOL 200 MCG PO TABS
1000.0000 ug | ORAL_TABLET | Freq: Once | ORAL | Status: AC
  Administered 2020-09-30: 1000 ug via RECTAL

## 2020-09-30 MED ORDER — LIDOCAINE HCL (PF) 1 % IJ SOLN
INTRAMUSCULAR | Status: DC | PRN
  Administered 2020-09-30 (×2): 4 mL via EPIDURAL

## 2020-09-30 MED ORDER — LIDOCAINE HCL (PF) 2 % IJ SOLN
INTRAMUSCULAR | Status: DC | PRN
  Administered 2020-09-30: 60 mg

## 2020-09-30 MED ORDER — TRANEXAMIC ACID-NACL 1000-0.7 MG/100ML-% IV SOLN
INTRAVENOUS | Status: AC
  Administered 2020-09-30: 1000 mg
  Filled 2020-09-30: qty 100

## 2020-09-30 MED ORDER — MEASLES, MUMPS & RUBELLA VAC IJ SOLR
0.5000 mL | Freq: Once | INTRAMUSCULAR | Status: DC
  Filled 2020-09-30: qty 0.5

## 2020-09-30 MED ORDER — LIDOCAINE-EPINEPHRINE (PF) 2 %-1:200000 IJ SOLN
INTRAMUSCULAR | Status: DC | PRN
  Administered 2020-09-30 (×2): 10 mL via EPIDURAL

## 2020-09-30 MED ORDER — SODIUM CHLORIDE 0.9 % IV SOLN
25.0000 mg | Freq: Four times a day (QID) | INTRAVENOUS | Status: DC | PRN
  Administered 2020-09-30 – 2020-10-01 (×5): 25 mg via INTRAVENOUS
  Filled 2020-09-30 (×5): qty 1

## 2020-09-30 MED ORDER — OXYCODONE HCL 5 MG PO TABS
5.0000 mg | ORAL_TABLET | Freq: Four times a day (QID) | ORAL | Status: DC | PRN
  Administered 2020-09-30: 5 mg via ORAL
  Filled 2020-09-30: qty 1

## 2020-09-30 MED ORDER — DIBUCAINE (PERIANAL) 1 % EX OINT: 1.0000 "application " | TOPICAL_OINTMENT | CUTANEOUS | Status: DC | PRN

## 2020-09-30 MED ORDER — PROPOFOL 10 MG/ML IV BOLUS
INTRAVENOUS | Status: DC | PRN
  Administered 2020-09-30: 160 mg via INTRAVENOUS

## 2020-09-30 MED ORDER — KETOROLAC TROMETHAMINE 30 MG/ML IJ SOLN
30.0000 mg | Freq: Once | INTRAMUSCULAR | Status: AC
  Administered 2020-09-30: 30 mg via INTRAVENOUS
  Filled 2020-09-30: qty 1

## 2020-09-30 MED ORDER — LIDOCAINE HCL (PF) 2 % IJ SOLN: INTRAMUSCULAR | Status: DC | PRN

## 2020-09-30 MED ORDER — MISOPROSTOL 200 MCG PO TABS
ORAL_TABLET | ORAL | Status: AC
  Filled 2020-09-30: qty 5

## 2020-09-30 MED ORDER — OXYCODONE HCL 5 MG PO TABS
5.0000 mg | ORAL_TABLET | Freq: Once | ORAL | Status: AC
  Administered 2020-09-30: 5 mg via ORAL
  Filled 2020-09-30: qty 1

## 2020-09-30 MED ORDER — DEXMEDETOMIDINE (PRECEDEX) IN NS 20 MCG/5ML (4 MCG/ML) IV SYRINGE: PREFILLED_SYRINGE | INTRAVENOUS | Status: DC | PRN

## 2020-09-30 MED ORDER — DOCUSATE SODIUM 100 MG PO CAPS
100.0000 mg | ORAL_CAPSULE | Freq: Two times a day (BID) | ORAL | Status: DC
  Filled 2020-09-30 (×2): qty 1

## 2020-09-30 MED ORDER — HYDROMORPHONE HCL 1 MG/ML IJ SOLN
0.2500 mg | INTRAMUSCULAR | Status: DC | PRN
  Administered 2020-09-30: 0.5 mg via INTRAVENOUS

## 2020-09-30 MED ORDER — OXYCODONE HCL 5 MG PO TABS
5.0000 mg | ORAL_TABLET | Freq: Four times a day (QID) | ORAL | Status: DC | PRN
  Administered 2020-09-30 – 2020-10-01 (×3): 10 mg via ORAL
  Filled 2020-09-30 (×3): qty 2

## 2020-09-30 MED ORDER — SUCCINYLCHOLINE CHLORIDE 20 MG/ML IJ SOLN
INTRAMUSCULAR | Status: DC | PRN
  Administered 2020-09-30: 120 mg via INTRAVENOUS

## 2020-09-30 MED ORDER — BUPIVACAINE HCL (PF) 0.5 % IJ SOLN
INTRAMUSCULAR | Status: DC | PRN
  Administered 2020-09-30: 7 mL

## 2020-09-30 MED ORDER — METHADONE HCL 10 MG/ML PO CONC: 150.0000 mg | Freq: Every day | ORAL | Status: DC

## 2020-09-30 MED ORDER — BUPIVACAINE HCL (PF) 0.5 % IJ SOLN
INTRAMUSCULAR | Status: AC
  Filled 2020-09-30: qty 30

## 2020-09-30 MED ORDER — TRANEXAMIC ACID-NACL 1000-0.7 MG/100ML-% IV SOLN
INTRAVENOUS | Status: AC
  Filled 2020-09-30: qty 100

## 2020-09-30 SURGICAL SUPPLY — 27 items
BENZOIN TINCTURE PRP APPL 2/3 (GAUZE/BANDAGES/DRESSINGS) IMPLANT
BLADE SURG 11 STRL SS (BLADE) ×2 IMPLANT
CLIP FILSHIE TUBAL LIGA STRL (Clip) ×1 IMPLANT
CLOTH BEACON ORANGE TIMEOUT ST (SAFETY) ×2 IMPLANT
DRSG OPSITE POSTOP 3X4 (GAUZE/BANDAGES/DRESSINGS) ×2 IMPLANT
DURAPREP 26ML APPLICATOR (WOUND CARE) ×2 IMPLANT
ELECT REM PT RETURN 9FT ADLT (ELECTROSURGICAL) ×2
ELECTRODE REM PT RTRN 9FT ADLT (ELECTROSURGICAL) ×1 IMPLANT
GLOVE BIOGEL PI IND STRL 7.0 (GLOVE) ×3 IMPLANT
GLOVE BIOGEL PI INDICATOR 7.0 (GLOVE) ×3
GLOVE ECLIPSE 7.0 STRL STRAW (GLOVE) ×2 IMPLANT
GOWN STRL REUS W/TWL LRG LVL3 (GOWN DISPOSABLE) ×4 IMPLANT
NEEDLE HYPO 22GX1.5 SAFETY (NEEDLE) ×2 IMPLANT
NS IRRIG 1000ML POUR BTL (IV SOLUTION) ×2 IMPLANT
PACK ABDOMINAL MINOR (CUSTOM PROCEDURE TRAY) ×2 IMPLANT
PENCIL BUTTON HOLSTER BLD 10FT (ELECTRODE) ×4 IMPLANT
PROTECTOR NERVE ULNAR (MISCELLANEOUS) ×2 IMPLANT
SPONGE LAP 4X18 RFD (DISPOSABLE) ×2 IMPLANT
SUT VIC AB 0 CT1 27 (SUTURE) ×2
SUT VIC AB 0 CT1 27XBRD ANBCTR (SUTURE) ×1 IMPLANT
SUT VIC AB 2-0 CT1 27 (SUTURE) ×4
SUT VIC AB 2-0 CT1 TAPERPNT 27 (SUTURE) ×2 IMPLANT
SUT VICRYL 4-0 PS2 18IN ABS (SUTURE) ×2 IMPLANT
SYR CONTROL 10ML LL (SYRINGE) ×2 IMPLANT
TOWEL OR 17X24 6PK STRL BLUE (TOWEL DISPOSABLE) ×4 IMPLANT
TRAY FOLEY CATH SILVER 14FR (SET/KITS/TRAYS/PACK) ×2 IMPLANT
WATER STERILE IRR 1000ML POUR (IV SOLUTION) ×2 IMPLANT

## 2020-09-30 NOTE — Transfer of Care (Signed)
Immediate Anesthesia Transfer of Care Note  Patient: Kelly [name]  Procedure(s) Performed: POST PARTUM TUBAL LIGATION (Bilateral )  Patient Location: PACU  Anesthesia Type:General  Level of Consciousness: awake, alert  and oriented  Airway & Oxygen Therapy: Patient Spontanous Breathing and Patient connected to nasal cannula oxygen  Post-op Assessment: Report given to RN and Post -op Vital signs reviewed and stable  Post vital signs: Reviewed and stable  Last Vitals:  Vitals Value Taken Time  BP 103/72 09/30/20 0715  Temp    Pulse 76 09/30/20 0720  Resp 17 09/30/20 0720  SpO2 99 % 09/30/20 0720  Vitals shown include unvalidated device data.  Last Pain:  Vitals:   09/30/20 0407  TempSrc: Axillary  PainSc:          Complications: No complications documented.

## 2020-09-30 NOTE — Anesthesia Procedure Notes (Signed)
Procedure Name: Intubation Date/Time: 09/30/2020 6:37 AM Performed by: Sandrea Matte, CRNA Pre-anesthesia Checklist: Patient identified, Emergency Drugs available, Suction available and Patient being monitored Patient Re-evaluated:Patient Re-evaluated prior to induction Oxygen Delivery Method: Circle system utilized Preoxygenation: Pre-oxygenation with 100% oxygen Induction Type: IV induction, Rapid sequence and Cricoid Pressure applied Laryngoscope Size: Mac and 3 Grade View: Grade II Tube type: Oral Tube size: 7.0 mm Number of attempts: 1 Airway Equipment and Method: Stylet and Oral airway Placement Confirmation: ETT inserted through vocal cords under direct vision,  positive ETCO2 and breath sounds checked- equal and bilateral Secured at: 21 cm Tube secured with: Tape Dental Injury: Teeth and Oropharynx as per pre-operative assessment

## 2020-09-30 NOTE — Progress Notes (Signed)
CSW acknowledged consult and attempted to meet with [name] to complete psychosocial assessment; however, [name] reported that she recently returned from a procedure and is currently in pain. [name] requested that CSW return at a later time, CSW agreed.   Abundio Miu, [place] Worker [facility] Cell#: [phone]

## 2020-09-30 NOTE — Op Note (Signed)
Kelly [name] 09/29/2020 - 09/30/2020  PREOPERATIVE DIAGNOSES: Multiparity, undesired fertility  POSTOPERATIVE DIAGNOSES: Multiparity, undesired fertility  PROCEDURE:  Postpartum Bilateral Salpingectomy  SURGEON: Dr.  Verita Schneiders  ASSISTANT: Dr. Sharene Skeans  ANESTHESIA:  Epidural and local analgesia using 10 ml of 0.5% Marcaine, but had to convert to General Anesthesia due to patient discomfort  ANESTHESIOLOGY TEAM : Anesthesiologist: Josephine Igo, MD CRNA: Sandrea Matte, CRNA; Rayvon Char, CRNA  COMPLICATIONS:  None immediate.  ESTIMATED BLOOD LOSS: 10 ml.  INDICATIONS:  35 y.o. [id number] with undesired fertility, status post vaginal delivery, desires permanent sterilization.  Risks of procedure discussed with patient including but not limited to: risk of regret, permanence of method, bleeding, infection, injury to surrounding organs and need for additional procedures.  Discussed failure risk of 1% with increased risk of ectopic gestation if pregnancy occurs.  Also discussed possibility of post-tubal syndrome with increased pelvic pain or menstrual irregularities.  Patient verbalized understanding of these risks and wants to proceed with sterilization.  Written informed consent obtained.     FINDINGS:  Normal uterus, tubes, and ovaries. Excised fallopian tubes were sent to pathology.  PROCEDURE DETAILS: The patient was taken to the operating room where her epidural anesthesia was dosed up to surgical level and found to be adequate initially.  She was then placed in the dorsal supine position and prepped and draped in sterile fashion.   After an adequate timeout was performed, attention was turned to the patient's abdomen local analgesia was administered. Patient still reported feeling Allis clamps applied under her umbilical fold, despite the local analgesia and epidural.  General anesthesia was then administered.  A small transverse skin incision was made under the  umbilical fold. The incision was taken down to the layer of fascia using the scalpel, and fascia was incised, and extended bilaterally using Mayo scissors. The peritoneum was entered in a blunt fashion.  Attention was then turned to the patient's uterus, and left fallopian tube was then identified, and the Babcock clamp was then used to grasp the tube. Kelly forceps were placed on the distal portion of the mesosalpinx underneath about 80-90% of the tube.  This pedicle was double suture ligated with 2-0 Vicryl, and this large portion of the tube including the fimbriated end was excised.  The right fallopian tube was then identified, doubly ligated, excised in a similar fashion allowing for bilateral salpingectomy.   Good hemostasis was noted overall.  The instruments were then removed from the patient's abdomen and the fascial incision was repaired with 0 Vicryl, and the skin was closed with a 4-0 Vicryl subcuticular stitch. The patient tolerated the procedure well.  Instrument, sponge, and needle counts were correct times three.  The patient was then taken to the recovery room awake and in stable condition.   Verita Schneiders, MD, [place] for [employer], [place]

## 2020-09-30 NOTE — Discharge Summary (Addendum)
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 yo [phone] 36w3das admitted to the hospital 09/29/2020 for induction of labor.  Indication for induction: cHTN, CHF due to severe tricuspid valve regurgitation. She was placed on telemetry and was continued on her PTA lasix and methadone. She received Ancef prior to delivery. Patient had an uncomplicated labor course as follows: Membrane Rupture Time/Date: 5:26 PM ,09/29/2020   Delivery Method:Vaginal, Spontaneous  Episiotomy: None  Lacerations:  None  Details of delivery can be found in separate delivery note.  Postpartum she had diarrhea and c-difficile was positive. Infectious disease was consulted and made recommendation for antibiotic treatment. She improved and was ready for outpatient management Patient is discharged home 10/05/20.  Newborn  Data: Birth date:09/30/2020  Birth time:5:15 AM  Gender:Female  Living status:Living  Apgars:7 ,9  Weight:2830 g   Magnesium Sulfate received: No BMZ received: No Rhophylac:N/A MMR:No T-DaP:Given prenatally Flu: No Transfusion:No  Physical exam  Vitals:   10/04/20 2337 10/04/20 2338 10/05/20 0408 10/05/20 0802  BP: (!) 122/91 118/89 115/85 138/89  Pulse: 68 66 66 67  Resp:  _0 Temp:  98.3 F (36.8 C) 97.9 F (36.6 C) 97.7 F (36.5 C)  TempSrc:  Oral Oral Oral  SpO2:  97% 100% 99%   General: alert, cooperative and no distress Lochia: appropriate Uterine Fundus: firm Incision: N/A DVT Evaluation: No evidence of DVT seen on physical exam. Labs: Lab Results  Component Value Date   WBC 12.2 (H) 10/01/2020   HGB 8.5 (L) 10/01/2020   HCT 26.0 (L) 10/01/2020   MCV 96.3 10/01/2020   PLT 253 10/01/2020   CMP Latest Ref Rng & Units 10/01/2020  Glucose 70 - 99 mg/dL 111(H)  BUN 6 - 20 mg/dL 13  Creatinine 0.44 - 1.00 mg/dL 0.85  Sodium 135 - 145 mmol/L 133(L)  Potassium 3.5 - 5.1 mmol/L 4.1  Chloride 98 - 111 mmol/L 104  CO2 22 - 32 mmol/L 22  Calcium 8.9 - 10.3 mg/dL 8.4(L)  Total Protein 6.5 - 8.1 g/dL 5.2(L)  Total Bilirubin 0.3 - 1.2 mg/dL 0.5  Alkaline Phos 38 - 126 U/L 73  AST 15 - 41 U/L 23  ALT 0 - 44 U/L 7   Edinburgh Score: Edinburgh Postnatal Depression Scale Screening Tool 10/04/2020  I have been able to laugh and see the funny side of things. 0  I have looked forward with enjoyment to things. 1  I have blamed myself unnecessarily when things went wrong. 2  I have been anxious or worried for no good reason. 2  I have felt scared or panicky for no good reason. 2  Things have been getting on top of me. 2  I have been so unhappy that I have had difficulty sleeping. 2  I have felt sad or miserable. 2  I have been so unhappy that I have been crying. 1  The thought of harming myself has occurred to me. 0  Edinburgh Postnatal Depression Scale Total 14      After visit meds:  Allergies as of 10/05/2020      Reactions   Hydrocodone Itching   Morphine And Related Nausea And Vomiting      Medication List    STOP taking these medications   promethazine 25 MG tablet Commonly known as: PHENERGAN     TAKE these medications   Dificid 200 MG Tabs tablet Generic drug: fidaxomicin Take 1 tablet (200 mg total) by mouth 2 (two) times daily.   furosemide 40 MG tablet Commonly known as: LASIX Take 1 tablet (40 mg total) by mouth daily.   ibuprofen 600 MG tablet Commonly known as: ADVIL Take 1 tablet (600 mg total) by mouth every 6 (six) hours as needed.   loperamide 2 MG capsule Commonly known as: IMODIUM Take 1 capsule (2 mg total) by mouth 4 (four) times daily as needed for diarrhea or loose stools.   methadone 10 MG/5ML solution Commonly known as: DOLOPHINE Take 140 mg by mouth daily.   NIFEdipine 30 MG 24 hr tablet Commonly known as: ADALAT CC Take 1 tablet (30 mg total) by mouth daily.   prenatal vitamin w/FE, FA 29-1 MG Chew chewable tablet Chew 1 tablet by mouth daily at 12 noon.   sertraline 50 MG tablet Commonly known as: ZOLOFT Take 1 tablet (50 mg total) by mouth daily.        Discharge home in stable condition Infant Feeding: Bottle Infant Disposition:NICU Discharge instruction: per After Visit Summary and Postpartum booklet. Activity: Advance as tolerated. Pelvic rest for 6 weeks.  Diet: routine diet Future Appointments: Future Appointments  Date Time Provider [place]  10/07/2020 10:00 AM [facility] NURSE [name]  10/20/2020  1:45 PM Kuppelweiser, Gillian Shields, RPH-CPP [facility]  11/28/2020  3:20 PM Bensimhon, Shaune Pascal, MD [facility] None   Follow up Visit:  [place] [facility] [place] at [name] Follow up in 4 week(s).   Specialty: Obstetrics and Gynecology Why: 1 week BP check Contact information: [place]  [id number] [phone]               Please schedule this patient for a In person postpartum visit in 4 weeks with the following provider: MD. Additional Postpartum F/U:Postpartum Depression checkup in 2 weeks, BP check 1 week   High risk pregnancy complicated by: severe tricupsid valve regurgitation, CHF, cHTN, polysubstance use on methadone, hx endocarditis  Delivery mode:  Vaginal, Spontaneous  Anticipated Birth Control:  BTL done [facility]   10/05/2020 Emeterio Reeve, MD

## 2020-09-30 NOTE — Addendum Note (Signed)
Addendum  created 09/30/20 1531 by Josephine Igo, MD   Intraprocedure Event edited

## 2020-09-30 NOTE — Anesthesia Postprocedure Evaluation (Signed)
Anesthesia Post Note  Patient: Assia N Bazen  Procedure(s) Performed: POST PARTUM TUBAL LIGATION (Bilateral )     Patient location during evaluation: PACU Anesthesia Type: Epidural and General Level of consciousness: awake and alert, oriented and patient cooperative Pain management: pain level controlled Vital Signs Assessment: post-procedure vital signs reviewed and stable Respiratory status: spontaneous breathing, nonlabored ventilation and respiratory function stable Cardiovascular status: blood pressure returned to baseline and stable Postop Assessment: no apparent nausea or vomiting, patient able to bend at knees, no headache, no backache and epidural receding Anesthetic complications: no Comments: Conversion from epidural to GA, no other issues   No complications documented.  Last Vitals:  Vitals:   09/30/20 0745 09/30/20 0800  BP: 100/64 108/88  Pulse: 83 83  Resp: 10 (!) 22  Temp:    SpO2: 97% 94%    Last Pain:  Vitals:   09/30/20 0730  TempSrc: Oral  PainSc: 0-No pain   Pain Goal:    LLE Motor Response: Purposeful movement (09/30/20 0730)   RLE Motor Response: Purposeful movement (09/30/20 0730)       Epidural/Spinal Function Cutaneous sensation: Normal sensation (09/30/20 0730), Patient able to flex knees: Yes (09/30/20 0730), Patient able to lift hips off bed: Yes (09/30/20 0730), Back pain beyond tenderness at insertion site: No (09/30/20 0730), Progressively worsening motor and/or sensory loss: No (09/30/20 0730), Bowel and/or bladder incontinence post epidural: No (09/30/20 0730)  Pervis Hocking

## 2020-09-30 NOTE — Progress Notes (Signed)
    Faculty Practice OB/GYN Attending Postpartum Sterilization Counseling Note  35 y.o. [id number] s/p recent vaginal delivery at 42w3dwho desires permanent sterilization. She was admitted for IOL for cHTN, CHF secondary to severe tricupsid valve regurgitation in the setting of long term IVDU. Medicaid papers had been signed on 08/05/2020.   Details of postpartum tubal sterilization discussed in detail.   She was told that this will be performed as either salpingectomy or occlusion with [place] clips, depending on difficulty of procedure, exposure and other factors.  Risks of procedure discussed with patient including but not limited to: risk of regret, permanence of method, bleeding, infection, injury to surrounding organs and need for additional procedures.  Failure risk of about 1-2% with increased risk of ectopic gestation if pregnancy occurs was also discussed with patient.   Also discussed possibility of post-tubal syndrome with increased pelvic pain or menstrual irregularities. Patient verbalized understanding of these risks and wants to proceed with sterilization.  Written informed consent obtained.  NPO and other preoperative orders placed.  Will continue close observation and postpartum care as ordered. To OR when ready.    UVerita Schneiders MD, [place]for [facility] [employer] [facility] [place]

## 2020-09-30 NOTE — Anesthesia Postprocedure Evaluation (Signed)
Anesthesia Post Note  Patient: Kelly [name]  Procedure(s) Performed: AN AD HOC LABOR EPIDURAL     Patient location during evaluation: [facility] Anesthesia Type: Epidural Level of consciousness: awake and alert and oriented Pain management: pain level controlled Vital Signs Assessment: post-procedure vital signs reviewed and stable Respiratory status: spontaneous breathing, nonlabored ventilation and respiratory function stable Cardiovascular status: blood pressure returned to baseline and stable Postop Assessment: no headache, no backache and epidural receding Anesthetic complications: no   No complications documented.  Last Vitals:  Vitals:   09/30/20 0929 09/30/20 1215  BP: 113/71 121/77  Pulse: 84 66  Resp: 18 14  Temp:  36.6 C  SpO2:  97%    Last Pain:  Vitals:   09/30/20 1215  TempSrc: Oral  PainSc:    Pain Goal: Patients Stated Pain Goal: 4 (09/30/20 1145)                 Callaway Hailes A.

## 2020-09-30 NOTE — Anesthesia Preprocedure Evaluation (Signed)
Anesthesia Evaluation  Patient identified by MRN, date of birth, ID band Patient awake    Reviewed: Allergy & Precautions, NPO status , Patient's Chart, lab work & pertinent test results, reviewed documented beta blocker date and time   Airway Mallampati: III  TM Distance: >3 FB Neck ROM: Full    Dental  (+) Poor Dentition, Chipped, Missing, Dental Advisory Given   Pulmonary pneumonia, resolved, former smoker,    Pulmonary exam normal breath sounds clear to auscultation       Cardiovascular hypertension, +CHF   Rhythm:Regular Rate:Normal + Systolic murmurs Hx/o Bacterial endocarditis Severe TR She is currently on Cardiac Telemetry  Echo 09/22/20 1. Left ventricular ejection fraction, by estimation, is 45 to 50%. The left ventricle has mildly decreased function. The left ventricle has no regional wall motion abnormalities. Left ventricular diastolic parameters were normal. There is the interventricular septum is flattened in systole and diastole, consistent with right ventricular pressure and volume overload.  2. Right ventricular systolic function is moderately reduced. The right ventricular size is severely enlarged. There is mildly elevated pulmonary artery systolic pressure.  3. The mitral valve is normal in structure. Trivial mitral valve regurgitation. No evidence of mitral stenosis.  4. Tricuspid leaflets do not coapt well. There is very severe central TR. The tricuspid valve is abnormal. Tricuspid valve regurgitation is  severe.  5. The aortic valve is normal in structure. Aortic valve regurgitation is not visualized. No aortic stenosis is present.  6. The inferior vena cava is normal in size with greater than 50% respiratory variability, suggesting right atrial pressure of 3 mmHg.  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 S/P lumbar laminectomy She has had continuous spinal catheter for labor with prior pregnancy in 2020   Abdominal   Peds  Hematology  (+) anemia ,   Anesthesia Other Findings   Reproductive/Obstetrics (+) Pregnancy 37 2/7 weeks AOG Complicated pregnancy due to IVDA Treated for syphilis during pregnancy Undesired fertility                             Anesthesia Physical Anesthesia Plan  ASA: III  Anesthesia Plan: Epidural   Post-op Pain Management:    Induction:   PONV Risk Score and Plan:   Airway Management Planned: Natural Airway  Additional Equipment:   Intra-op Plan:   Post-operative Plan:   Informed Consent: I have reviewed the patients History and Physical, chart, labs and discussed the procedure including the risks, benefits and alternatives for the proposed anesthesia with the patient or authorized representative who has indicated his/her understanding and acceptance.       Plan Discussed with: Anesthesiologist  Anesthesia Plan Comments: (I have discussed the plan with Dr. AHarolyn Rutherford I spoke with the patient at length regarding the risks of placement of an epidural catheter including but not limited to dural puncture, PDPHA, infection post procedure which could necessitate operative treatment, inadequate pain relief due  to scar tissue in her back as well as her previous scoliosis surgery, permanent nerve damage, paralysis, hypotension, pruritis from narcotic as well as other unforseen  complications. She appears to understand and wishes to proceed with the procedure. Alternatives of IV narcotics discussed with patient. She understands that if she would require another C/Section she could possibly need a general anesthetic if she had an inadequate block from the epidural. She also understands that she could require a general anesthetic for a post partum BTL if she has an inadequate epidural block. I discussed the risks of GA with the patient, including but not limited to teeth damage, airway trauma, aspiration as well as cardiac instability. )        Anesthesia Quick Evaluation

## 2020-09-30 NOTE — Progress Notes (Signed)
Labor Progress Note Kelly [name] is a 35 y.o. [phone] at 7w2dpresented for IOL for CHF S: Feeling pain with contractions   O:  BP 119/71   Pulse 76   Temp 97.9 F (36.6 C) (Oral)   Resp 18  EFM: baseline 130/moderate variability/+accels/no decels  CVE: Dilation: 4 Effacement (%): 50 Station: -2 Presentation: Vertex Exam by:: JCharmayne Sheer MD   A&P: 35y.o. [id number] 325w2dor IOL for CHF, TOLAC with hx VBAC x2 #TOLAC: Latent. Oxytocin at 18 mu/min. AROM for clear fluid. Continue to titrate pitocin.  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 Notify peds.   JuJanet BerlinMD 12:20 AM

## 2020-09-30 NOTE — Anesthesia Procedure Notes (Signed)
Epidural Patient location during procedure: OB Start time: 09/30/2020 3:49 AM End time: 09/30/2020 4:20 AM  Staffing Anesthesiologist: Josephine Igo, MD Performed: anesthesiologist   Preanesthetic Checklist Completed: patient identified, IV checked, site marked, risks and benefits discussed, surgical consent, monitors and equipment checked, pre-op evaluation and timeout performed  Epidural Patient position: sitting Prep: DuraPrep and site prepped and draped Patient monitoring: continuous pulse ox and blood pressure Approach: midline Location: L3-L4 Injection technique: LOR air  Needle:  Needle type: Tuohy  Needle gauge: 17 G Needle length: 9 cm and 9 Needle insertion depth: 8 cm Catheter type: closed end flexible Catheter size: 19 Gauge Catheter at skin depth: 13 cm Test dose: negative and Other  Assessment Events: blood not aspirated, injection not painful, no injection resistance, no paresthesia and negative IV test  Additional Notes Patient identified. Risks and benefits discussed including failed block, incomplete  Pain control, post dural puncture headache, nerve damage, paralysis, blood pressure Changes, nausea, vomiting, reactions to medications-both toxic and allergic and post Partum back pain. All questions were answered. Patient expressed understanding and wished to proceed. Sterile technique was used throughout procedure. Epidural site was Dressed with sterile barrier dressing. No paresthesias, signs of intravascular injection Or signs of intrathecal spread were encountered. Very difficult epidural. Multiple attempts at L4-5 and L3-4.  Patient was more comfortable after the epidural was dosed. Please see RN's note for documentation of vital signs and FHR which are stable. Reason for block:procedure for pain

## 2020-09-30 NOTE — Lactation Note (Signed)
This note was copied from a baby's chart. Lactation Consultation Note LC to room for initial consult. Exposure to methadone in utero and shaky / jittery during visit. [place] assisted with hand expression and spoon feeding. Set up pump and encouraged mom to pump q3 if baby is not bf'ing. Currently following EAS; NICU provider to assess. Will plan f/u visit.  Patient Name: Kelly [name] [id number] Date: 09/30/2020 Reason for consult: Initial assessment Age:36 hours  Maternal Data Has patient been taught Hand Expression?: Yes Does the patient have breastfeeding experience prior to this delivery?: Yes How long did the patient breastfeed?: 3-6 mo Normal breast symmetry No hx breast surgery/trauma Copious colostrum   Feeding Mother's Current Feeding Choice: Breast Milk and Formula  LATCH Score Latch: Too sleepy or reluctant, no latch achieved, no sucking elicited.  Audible Swallowing: None  Type of Nipple: Everted at rest and after stimulation  Comfort (Breast/Nipple): Soft / non-tender  Hold (Positioning): Assistance needed to correctly position infant at breast and maintain latch.  LATCH Score: 5   Lactation Tools Discussed/Used  spoon  Interventions Interventions: Education;Assisted with latch;Expressed milk;Skin to skin;Hand express;DEBP Lactation services brochure   Consult Status Consult Status: Follow-up Follow-up type: [place], [place] IBCLC 09/30/2020, 3:38 PM

## 2020-09-30 NOTE — Anesthesia Preprocedure Evaluation (Signed)
Anesthesia Evaluation  Patient identified by MRN, date of birth, ID band Patient awake    Reviewed: Allergy & Precautions, NPO status , Patient's Chart, lab work & pertinent test results, reviewed documented beta blocker date and time   Airway Mallampati: III  TM Distance: >3 FB Neck ROM: Full    Dental  (+) Poor Dentition, Chipped, Missing, Dental Advisory Given   Pulmonary pneumonia, resolved, former smoker,    Pulmonary exam normal breath sounds clear to auscultation       Cardiovascular hypertension, +CHF  + Valvular Problems/Murmurs  Rhythm:Regular Rate:Normal + Systolic murmurs Hx/o Bacterial endocarditis Severe TR She is currently on Cardiac Telemetry  Echo 09/22/20 1. Left ventricular ejection fraction, by estimation, is 45 to 50%. The left ventricle has mildly decreased function. The left ventricle has no regional wall motion abnormalities. Left ventricular diastolic parameters were normal. There is the interventricular septum is flattened in systole and diastole, consistent with right ventricular pressure and volume overload.  2. Right ventricular systolic function is moderately reduced. The right ventricular size is severely enlarged. There is mildly elevated pulmonary artery systolic pressure.  3. The mitral valve is normal in structure. Trivial mitral valve regurgitation. No evidence of mitral stenosis.  4. Tricuspid leaflets do not coapt well. There is very severe central TR. The tricuspid valve is abnormal. Tricuspid valve regurgitation is  severe.  5. The aortic valve is normal in structure. Aortic valve regurgitation is not visualized. No aortic stenosis is present.  6. The inferior vena cava is normal in size with greater than 50% respiratory variability, suggesting right atrial pressure of 3 mmHg.  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 S/P lumbar laminectomy She has had continuous spinal catheter for labor with prior pregnancy in 2020   Abdominal   Peds  Hematology  (+) anemia ,   Anesthesia Other Findings   Reproductive/Obstetrics (+) Pregnancy 37 2/7 weeks AOG Complicated pregnancy due to IVDA Treated for syphilis during pregnancy Undesired fertility                             Anesthesia Physical  Anesthesia Plan  ASA: III  Anesthesia Plan: Epidural   Post-op Pain Management:    Induction:   PONV Risk Score and Plan:   Airway Management Planned: Natural Airway  Additional Equipment:   Intra-op Plan:   Post-operative Plan:   Informed Consent: I have reviewed the patients History and Physical, chart, labs and discussed the procedure including the risks, benefits and alternatives for the proposed anesthesia with the patient or authorized representative who has indicated his/her understanding and acceptance.       Plan Discussed with: Anesthesiologist  Anesthesia Plan Comments: (I have discussed the plan with Dr. AHarolyn Rutherford I spoke with the patient at length regarding the risks of placement of an epidural catheter including but not limited to dural puncture, PDPHA, infection post procedure which could necessitate operative treatment,  inadequate pain relief due to scar tissue in her back as well as her previous scoliosis surgery, permanent nerve damage, paralysis, hypotension, pruritis from narcotic as  well as other unforseen complications. She appears to understand and wishes to proceed with the procedure. Alternatives of IV narcotics discussed with patient. She understands that if she would require another C/Section she could possibly need a general anesthetic if she had an inadequate block from the epidural. She also understands that she could require a general anesthetic for a post partum BTL if she has an inadequate epidural block. I discussed the risks of GA with the patient, including but not limited to teeth damage, airway trauma, aspiration as well as cardiac instability.   Patient has delivered. Epidural worked for labor analgesia. Will attempt to use for PPTL. If inadequate level, will proceed with GETA.)        Anesthesia Quick Evaluation

## 2020-09-30 NOTE — Progress Notes (Signed)
Labor Progress Note [name] is a 35 y.o. [phone] at 81w2dpresented for IOL for CHF S:  Getting epidural at this time   O:  BP 121/73   Pulse 67   Temp 97.9 F (36.6 C) (Oral)   Resp 18  EFM: baseline 130/moderate variability/+accels/no decels  CVE: Dilation: 6.5 Effacement (%): 70 Station: -1 Presentation: Vertex Exam by:: LArby Barrette RN   A&P: 35y.o. G([phone] IOL for CHF, TOLAC with hx VBAC x2 #TOLAC:   Oxytocin at 24 mu/min. AROM for clear fluid at 0015. Continue to titrate pitocin.  #Pain: getting epidural  #FWB: cat I #GBS negative #history of endocarditis: IV cefazolin 30-60 min prior to delivery for SBE ppx. Will start after epidural placement.  #QTc prolongation: most recent QTc wnl, continue cardiac monitoring, avoid QT prolonging agents #CHF/Chronic hTN: euvolemic, continue home furosemide, continue reduce IVF rate of 75cc.  #Substance use disorder: continue PTA methadone. UDS ordered.  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 Notify peds.   JuJanet BerlinMD 3:58 AM

## 2020-09-30 NOTE — Clinical Social Work Maternal (Signed)
CLINICAL SOCIAL WORK MATERNAL/CHILD NOTE  Patient Details  Name: Kelly [name] MRN: [id number] Date of Birth: September 05, 1985  Date:  09/30/2020  Clinical Social Worker Initiating Note:  Abundio Miu, [place] Date/Time: Initiated:  09/30/20/1452     Child's Name:  Unknown at this time   Biological Parents:  Mother,Father (Father: Kelly [name])   Need for Interpreter:  None   Reason for Referral:  Current Substance Use/Substance Use During Pregnancy ,Current CPS Involvement,Behavioral Health Concerns   Address:  [place]  [address]    Phone number:  [phone] (home)     Additional phone number:   Household Members/Support Persons (HM/SP):       HM/SP Name Relationship DOB or Age  HM/SP -1        HM/SP -2        HM/SP -3        HM/SP -4        HM/SP -5        HM/SP -6        HM/SP -7        HM/SP -8          Natural Supports (not living in the home):      Professional Supports: Other (Comment) ([facility])   Employment: Unemployed   Type of Work:     Education:  [place] arranged:    Museum/gallery curator Resources:  [employer]   Other Resources:  [employer]    Cultural/Religious Considerations Which May Impact Care:    Strengths:  Ability to meet basic needs ,Psychotropic Medications,Home prepared for child ,Pediatrician chosen   Psychotropic Medications:  Zoloft      Pediatrician:    [place]  Lexicographer List:   [employer] [facility] [employer] [place]      Pediatrician Fax Number:    Risk Factors/Current Problems:  Substance Use ,Mental Health Concerns ,DHHS Involvement    Cognitive State:  Able to Concentrate ,Alert ,Insightful ,Goal Oriented ,Linear Thinking    Mood/Affect:  Calm ,Interested ,Comfortable    CSW Assessment:  CSW notified by [place] (Courtlin Polo) that [place] social  worker Clemencia Course) requested that a CPS report be made due to [name]'s CPS history.   CSW met with [name] at bedside to discuss behavioral health concerns, substance use during pregnancy and DHHS CPS involvement. [name] was laying in bed and engaged in skin to skin with infant. CSW introduced self and explained reason for consult. [name] was welcoming, pleasant, and remained engaged during assessment. [name] reported that she currently resides at [facility] and [name] plan is to go to inpatient treatment with infant at one of the three places she has been accepted ([facility]). [name] reported that she has items needed to care for infant including a car seat and basinet. [name] reported that if she needs any additional items for infant that [facility] will provide those items. CSW inquired about [name]'s support system, [name] reported that [facility] staff are supports. [name] reported that [name] older children Kelly [name] 06/05/07 resides with [name] mother in [place], [place] and she is in contact) Kelly [name] 07/26/16 was adopted and she is in contact with the adoptive mother) Kelly [name] 10/16/18 is in custody of DHHS).   CSW inquired about [name]'s mental  health history. [name] reported that she was diagnosed with Bipolar Manic Depressive Disorder around 48/35 years old. [name] described [name] Bipolar Disorder as being good for a while and then having depressed periods and mood swings. [name] reported that she was diagnosed with severe anxiety and depression around 3/36 years old. [name] reported that she is currently taking Zoloft and is unsure if it is helpful. [name] reported that she may start taking additional medications because she is no longer pregnant. [name] reported that [name] medication is prescribed by [name] OB provider. CSW and [name] discussed [name]'s depression and anxiety. [name] reported that she experienced depression during [name] 8 week hospitalization when she almost died. CSW acknowledged and  validated [name]'s feelings surrounding that experience. [name] spoke about [name] stress and worries associated with getting to keep baby and getting into treatment. CSW actively listened and provided emotional support. [name] reported that she is participating in counseling at [place] with [place]. CSW inquired about how [name] was feeling emotionally after giving birth, [name] reported that she was feeling fine and is happy that baby is here. [name] presented calm and did not demonstrate any acute mental health signs/symptoms. CSW assessed for safety, [name] denied SI, HI and domestic violence.   CSW provided education regarding the baby blues period vs. perinatal mood disorders, discussed treatment and gave resources for mental health follow up if concerns arise.  CSW recommends self-evaluation during the postpartum time period using the New Mom Checklist from Postpartum Progress and encouraged [name] to contact a medical professional if symptoms are noted at any time.    CSW provided review of Sudden Infant Death Syndrome (SIDS) precautions.    CSW informed [name] about the hospital drug screen policy due to participation in medication assisted treatment program and substance use during pregnancy. [name] reported that she used heroin during pregnancy and reported that [name] last use was 07/13/2020. [name] reported that she has been participating in methadone treatment since 07/14/2020 and has been Sober since that date. CSW congratulated [name] on [name] sobriety. [name] reported that she is participating in Methadone treatment at [facility]. [name] reported that she is dosed daily with the exception of Sunday where she receives a take home dose on Saturday for Sunday. [name] expressed concern with being discharged over the weekend and not being able to get Methadone, CSW agreed to follow up with [name]'s attending. [name] reported that she participates in Counseling and that the program is helpful. [name] reported that she is  hopeful to go to inpatient treatment post discharge with infant. CSW informed [name] that infant's UDS was negative and CDS would continue to be monitored and CPS would be updated. CSW informed [name] that a CPS report would be made due to [name]'s current CPS involvement. [name] expressed frustrations about CPS report and trying to do the right thing. CSW acknowledged and normalized [name]'s frustrations. CSW emphasized [name]'s strengths with [name] sobriety and engaging in treatment. CSW encouraged [name] to try and stay focused on the strengths and working towards [name] goals.   CSW followed up with [name]'s attending who reported that [name] will receive Methadone on Sunday prior to discharge. CSW updated [name].   CSW made a [facility] CPS report due to [name]'s CPS history and substance use during pregnancy.   CSW spoke with [place] social worker Clemencia Course) who reported that there are barriers to infant discharging home to [name] at this time. CSW awaiting call from [place] regarding  infant's discharge plan.    CSW Plan/Description:  Psychosocial Support and Ongoing Assessment of Needs,Sudden Infant Death Syndrome (SIDS) Education,Perinatal Mood and Anxiety Disorder (PMADs) Education,Hospital Drug Screen Policy Information,Child Protective Service Report ,CSW Awaiting CPS Disposition [place], LCSW 09/30/2020, 3:10 PM

## 2020-09-30 NOTE — Discharge Instructions (Signed)
Postpartum Care After Vaginal Delivery The following information offers guidance about how to care for yourself from the time you deliver your baby to 6-12 weeks after delivery (postpartum period). If you have problems or questions, contact your health care provider for more specific instructions. Follow these instructions at home: Vaginal bleeding  It is normal to have vaginal bleeding (lochia) after delivery. Wear a sanitary pad for bleeding and discharge. ? During the first week after delivery, the amount and appearance of lochia is often similar to a menstrual period. ? Over the next few weeks, it will gradually decrease to a dry, yellow-brown discharge. ? For most women, lochia stops completely by 4-6 weeks after delivery, but can vary.  Change your sanitary pads frequently. Watch for any changes in your flow, such as: ? A sudden increase in volume. ? A change in color. ? Large blood clots.  If you pass a blood clot from your vagina, save it and call your health care provider. Do not flush blood clots down the toilet before talking with your health care provider.  Do not use tampons or douches until your health care provider approves.  If you are not breastfeeding, your period should return 6-8 weeks after delivery. If you are feeding your baby breast milk only, your period may not return until you stop breastfeeding. Perineal care  Keep the area between the vagina and the anus (perineum) clean and dry. Use medicated pads and pain-relieving sprays and creams as directed.  If you had a surgical cut in the perineum (episiotomy) or a tear, check the area for signs of infection until you are healed. Check for: ? More redness, swelling, or pain. ? Fluid or blood coming from the cut or tear. ? Warmth. ? Pus or a bad smell.  You may be given a squirt bottle to use instead of wiping to clean the perineum area after you use the bathroom. Pat the area gently to dry it.  To relieve pain  caused by an episiotomy, a tear, or swollen veins in the anus (hemorrhoids), take a warm sitz bath 2-3 times a day. In a sitz bath, the warm water should only come up to your hips and cover your buttocks.    Breast care  In the first few days after delivery, your breasts may feel heavy, full, and uncomfortable (breast engorgement). Milk may also leak from your breasts. Ask your health care provider about ways to help relieve the discomfort.  If you are breastfeeding: ? Wear a bra that supports your breasts and fits well. Use breast pads to absorb milk that leaks. ? Keep your nipples clean and dry. Apply creams and ointments as told. ? You may have uterine contractions every time you breastfeed for up to several weeks after delivery. This helps your uterus return to its normal size. ? If you have any problems with breastfeeding, notify your health care provider or lactation consultant.  If you are not breastfeeding: ? Avoid touching your breasts. Do not squeeze out (express) milk. Doing this can make your breasts produce more milk. ? Wear a good-fitting bra and use cold packs to help with swelling. Intimacy and sexuality  Ask your health care provider when you can engage in sexual activity. This may depend upon: ? Your risk of infection. ? How fast you are healing. ? Your comfort and desire to engage in sexual activity.  You are able to get pregnant after delivery, even if you have not had your period. Talk  with your health care provider about methods of birth control (contraception) or family planning if you desire future pregnancies. Medicines  Take over-the-counter and prescription medicines only as told by your health care provider.  Take an over-the-counter stool softener to help ease bowel movements as told by your health care provider.  If you were prescribed an antibiotic medicine, take it as told by your health care provider. Do not stop taking the antibiotic even if you start  to feel better.  Review all previous and current prescriptions to check for possible transfer into breast milk. Activity  Gradually return to your normal activities as told by your health care provider.  Rest as much as possible. Nap while your baby is sleeping. Eating and drinking  Drink enough fluid to keep your urine pale yellow.  To help prevent or relieve constipation, eat high-fiber foods every day.  Choose healthy eating to support breastfeeding or weight loss goals.  Take your prenatal vitamins until your health care provider tells you to stop.    General tips/recommendations  Do not use any products that contain nicotine or tobacco. These products include cigarettes, chewing tobacco, and vaping devices, such as e-cigarettes. If you need help quitting, ask your health care provider.  Do not drink alcohol, especially if you are breastfeeding.  Do not take medications or drugs that are not prescribed to you, especially if you are breastfeeding.  Visit your health care provider for a postpartum checkup within the first 3-6 weeks after delivery.  Complete a comprehensive postpartum visit no later than 12 weeks after delivery.  Keep all follow-up visits for you and your baby. Contact a health care provider if:  You feel unusually sad or worried.  Your breasts become red, painful, or hard.  You have a fever or other signs of an infection.  You have bleeding that is soaking through one pad an hour or you have blood clots.  You have a severe headache that doesn't go away or you have vision changes.  You have nausea and vomiting and are unable to eat or drink anything for 24 hours. Get help right away if:  You have chest pain or difficulty breathing.  You have sudden, severe leg pain.  You faint or have a seizure.  You have thoughts about hurting yourself or your baby. If you ever feel like you may hurt yourself or others, or have thoughts about taking your own  life, get help right away. Go to your nearest emergency department or:  Call your local emergency services (911 in the [place]  The National Suicide Prevention Lifeline at (862)850-2440. This suicide crisis helpline is open 24 hours a day.  Text the Crisis Text Line at 680-272-4462 (in the [place].). Summary  The period of time after you deliver your newborn up to 6-12 weeks after delivery is called the postpartum period.  Keep all follow-up visits for you and your baby.  Review all previous and current prescriptions to check for possible transfer into breast milk.  Contact a health care provider if you feel unusually sad or worried during the postpartum period. This information is not intended to replace advice given to you by your health care provider. Make sure you discuss any questions you have with your health care provider. Document Revised: 01/14/2020 Document Reviewed: 01/14/2020 Elsevier Patient Education  2021 [employer].

## 2020-10-01 DIAGNOSIS — O34219 Maternal care for unspecified type scar from previous cesarean delivery: Secondary | ICD-10-CM

## 2020-10-01 DIAGNOSIS — A0472 Enterocolitis due to Clostridium difficile, not specified as recurrent: Secondary | ICD-10-CM | POA: Diagnosis not present

## 2020-10-01 DIAGNOSIS — R197 Diarrhea, unspecified: Secondary | ICD-10-CM

## 2020-10-01 DIAGNOSIS — I079 Rheumatic tricuspid valve disease, unspecified: Secondary | ICD-10-CM

## 2020-10-01 DIAGNOSIS — F1120 Opioid dependence, uncomplicated: Secondary | ICD-10-CM | POA: Diagnosis not present

## 2020-10-01 LAB — CBC
HCT: 26 % — ABNORMAL LOW (ref 36.0–46.0)
Hemoglobin: 8.5 g/dL — ABNORMAL LOW (ref 12.0–15.0)
MCH: 31.5 pg (ref 26.0–34.0)
MCHC: 32.7 g/dL (ref 30.0–36.0)
MCV: 96.3 fL (ref 80.0–100.0)
Platelets: 253 10*3/uL (ref 150–400)
RBC: 2.7 MIL/uL — ABNORMAL LOW (ref 3.87–5.11)
RDW: 17.2 % — ABNORMAL HIGH (ref 11.5–15.5)
WBC: 12.2 10*3/uL — ABNORMAL HIGH (ref 4.0–10.5)
nRBC: 0 % (ref 0.0–0.2)

## 2020-10-01 LAB — COMPREHENSIVE METABOLIC PANEL
ALT: 7 U/L (ref 0–44)
AST: 23 U/L (ref 15–41)
Albumin: 2.1 g/dL — ABNORMAL LOW (ref 3.5–5.0)
Alkaline Phosphatase: 73 U/L (ref 38–126)
Anion gap: 7 (ref 5–15)
BUN: 13 mg/dL (ref 6–20)
CO2: 22 mmol/L (ref 22–32)
Calcium: 8.4 mg/dL — ABNORMAL LOW (ref 8.9–10.3)
Chloride: 104 mmol/L (ref 98–111)
Creatinine, Ser: 0.85 mg/dL (ref 0.44–1.00)
GFR, Estimated: >60 mL/min (ref >60)
Glucose, Bld: 111 mg/dL — ABNORMAL HIGH (ref 70–99)
Potassium: 4.1 mmol/L (ref 3.5–5.1)
Sodium: 133 mmol/L — ABNORMAL LOW (ref 135–145)
Total Bilirubin: 0.5 mg/dL (ref 0.3–1.2)
Total Protein: 5.2 g/dL — ABNORMAL LOW (ref 6.5–8.1)

## 2020-10-01 MED ORDER — LACTATED RINGERS IV SOLN: INTRAVENOUS | Status: DC

## 2020-10-01 MED ORDER — METHADONE HCL 10 MG/ML PO CONC
150.0000 mg | Freq: Every day | ORAL | Status: DC
Start: 2020-10-01 — End: 2020-10-05
  Administered 2020-10-01 – 2020-10-05 (×5): 150 mg via ORAL
  Filled 2020-10-01 (×5): qty 15

## 2020-10-01 MED ORDER — VANCOMYCIN 50 MG/ML ORAL SOLUTION
125.0000 mg | Freq: Four times a day (QID) | ORAL | Status: DC
  Administered 2020-10-01 – 2020-10-03 (×7): 125 mg via ORAL
  Filled 2020-10-01 (×8): qty 2.5

## 2020-10-01 MED ORDER — LORAZEPAM 2 MG/ML IJ SOLN
1.0000 mg | Freq: Four times a day (QID) | INTRAMUSCULAR | Status: DC | PRN
  Administered 2020-10-01 (×2): 1 mg via INTRAVENOUS
  Filled 2020-10-01 (×2): qty 0.5

## 2020-10-01 MED ORDER — OXYCODONE HCL 5 MG PO TABS
5.0000 mg | ORAL_TABLET | ORAL | Status: DC | PRN
  Administered 2020-10-01 – 2020-10-05 (×13): 10 mg via ORAL
  Filled 2020-10-01 (×14): qty 2

## 2020-10-01 MED ORDER — KETOROLAC TROMETHAMINE 30 MG/ML IJ SOLN
30.0000 mg | Freq: Four times a day (QID) | INTRAMUSCULAR | Status: DC
  Administered 2020-10-01 – 2020-10-02 (×3): 30 mg via INTRAMUSCULAR
  Filled 2020-10-01 (×3): qty 1

## 2020-10-01 MED ORDER — KETOROLAC TROMETHAMINE 30 MG/ML IJ SOLN
30.0000 mg | Freq: Once | INTRAMUSCULAR | Status: AC
  Administered 2020-10-01: 30 mg via INTRAVENOUS
  Filled 2020-10-01: qty 1

## 2020-10-01 MED ORDER — ZINC OXIDE 11.3 % EX CREA
TOPICAL_CREAM | Freq: Two times a day (BID) | CUTANEOUS | Status: DC
  Filled 2020-10-01: qty 56

## 2020-10-01 MED ORDER — LOPERAMIDE HCL 2 MG PO CAPS
2.0000 mg | ORAL_CAPSULE | Freq: Once | ORAL | Status: AC
  Administered 2020-10-01: 2 mg via ORAL
  Filled 2020-10-01: qty 1

## 2020-10-01 MED ORDER — LORAZEPAM 2 MG/ML IJ SOLN
1.0000 mg | Freq: Once | INTRAMUSCULAR | Status: AC
  Administered 2020-10-01: 1 mg via INTRAVENOUS
  Filled 2020-10-01: qty 1

## 2020-10-01 NOTE — Progress Notes (Signed)
Dr. Rip Harbour contacted for bedside assessment of patient due to uncontrolled pain and uncontrolled diarrhea. Orders  given. [place]

## 2020-10-01 NOTE — Progress Notes (Signed)
Attending Circumcision Counseling Progress Note  Patient desires circumcision for her female infant.  Circumcision procedure details discussed, risks and benefits of procedure were also discussed.  These include but are not limited to: Benefits of circumcision in men include reduction in the rates of urinary tract infection (UTI), penile cancer, some sexually transmitted infections, penile inflammatory and retractile disorders, as well as easier hygiene.  Risks include bleeding , infection, injury of glans which may lead to penile deformity or urinary tract issues, unsatisfactory cosmetic appearance and other potential complications related to the procedure.  It was emphasized that this is an elective procedure.  Patient wants to proceed with circumcision; written informed consent obtained.  Will do circumcision soon, routine circumcision and post circumcision care ordered for the infant.  Kelly [name], M.D. 10/01/2020 12:47 PM

## 2020-10-01 NOTE — Progress Notes (Signed)
Patient ID: Kelly [name], female   DOB: [birth year], 35 y.o.   MRN: [id number] Spoke with pt in regards to enteric precautions and unable to visit NICU at this time. Pt with known recent H/O of C. Diff due to prolong antibiotic use for SBE. Treated for C. Diff as out pt with oral Vancomycin. Pt reports completed 10 day course. Still has stool incont at times, soft, loose to watery diarrhea. No blood.  Will consult ID for further eval.

## 2020-10-01 NOTE — Progress Notes (Signed)
Pt given 55m of Ativan. 152mwasted with KiRica MoteN in [employer] SmothersRN

## 2020-10-01 NOTE — Consult Note (Signed)
[facility]       Reason for Consult: diarrhea    Referring Physician: Dr. 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 docusate sodium  100 mg Oral BID  . furosemide  40 mg Oral Daily  . ketorolac  30 mg Intramuscular Q6H  . measles, mumps & rubella vaccine  0.5 mL Subcutaneous Once  . methadone  150 mg Oral Daily  . prenatal multivitamin  1 tablet Oral Q1200  . senna-docusate  2 tablet Oral Q24H  . sertraline  50 mg Oral Daily  . Tdap  0.5 mL Intramuscular Once  . zinc oxide   Topical BID    Recommendations:  will restart oral vancomycin for 14 days with a taper after that - twice a day for 7 days followed by daily for 7 days and every other day for 3 weeks Avoid stool softeners, avoid ppi, avoid antibiotics  Assessment: She has a recent positive test for C diff associated with multiple, loose, watery stools which largely resolved with treatment but now has returned.  She has noted again multiple, loose watery stools today, she reports likely > 10 today, again concerning for relapse of C diff.  Testing unfortunately will not be of benefit since I anticipate it will be positive regardless of status as C diff would be present 30 days or more after initial infection.    Antibiotics: Peripartum cefazolin  HPI: Kelly [name] is a 35 y.o. 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  She was evaluated earlier this month for diarrhea and tested for C diff and a PCR was positive.  She was having > 10 watery stools at that time, improved once starting oral vancomycin but today noted to have worsening, water diarrhea again as before.  No other testing done.  She was started on oral vancomycin but symptoms did not seem to resolve.  She has resultant heart failure with significant TV regurgitation on diuretics.  Now also s/p tubal ligation. She is anxious to visit her son, but he remains in the NICU.    Review of Systems:  Constitutional: negative for fevers and chills Integument/breast: negative for rash All other systems reviewed and are negative    Past Medical History:  Diagnosis Date  . 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 Abscess of breast    rt breast  . Abscess of right breast 07/13/2014  . Acute cystitis without hematuria   . ADD (attention deficit disorder)   . Back pain   . Bacteremia due to Enterococcus 06/15/2018  . Bipolar 1 disorder ([place])   . Endocarditis 2020  . Foreign body granuloma of soft tissue, NEC, right upper arm   . History of hepatitis C 10/01/2018  . Hypertension   . Influenza B 06/17/2018  .  Leukocytosis   . Multifocal pneumonia 06/14/2018  . Polysubstance abuse ([place])   . Pyelonephritis affecting pregnancy 05/24/2016  . Sepsis ([place]) 12/02/2017  . Suicidal overdose ([facility])     Social History   Tobacco Use  . Smoking status: Former Smoker    Packs/day: 1.00    Years: 13.00    Pack years: 13.00    Types: Cigarettes    Quit date: 06/13/2018    Years since quitting: 2.3  . Smokeless tobacco: Never Used  Vaping Use  . Vaping Use: Never used  Substance Use Topics  . Alcohol use: Not Currently    Comment: one or two drinks weekly  . Drug use: Yes    Types: Cocaine, Heroin, IV, Fentanyl     Comment: heroin, crack cocaine, fentanyl last used today (07/13/20)     Family History  Problem Relation Age of Onset  . Diabetes Mother   . Diabetes Father   . Hypertension Father   . Heart attack Neg Hx   . Hyperlipidemia Neg Hx   . Sudden death Neg Hx     Allergies  Allergen Reactions  . Hydrocodone Itching  . Morphine And Related Nausea And Vomiting    Physical Exam: Constitutional: in no apparent distress  Vitals:   10/01/20 0811 10/01/20 1214  BP: 129/75 110/67  Pulse: (!) 106 (!) 106  Resp: 18 18  Temp: 98.2 F (36.8 C) 98.2 F (36.8 C)  SpO2: 98% 99%   EYES: anicteric ENMT: poor dentition Cardiovascular: Cor RRR Respiratory: clear; Musculoskeletal: no pedal edema noted Skin: negatives: no rash Neuro: non focal  Lab Results  Component Value Date   WBC 11.7 (H) 09/29/2020   HGB 10.4 (L) 09/29/2020   HCT 31.9 (L) 09/29/2020   MCV 93.3 09/29/2020   PLT 314 09/29/2020    Lab Results  Component Value Date   CREATININE 0.65 09/29/2020   BUN 13 09/29/2020   NA 134 (L) 09/29/2020   K 4.1 09/29/2020   CL 100 09/29/2020   CO2 26 09/29/2020    Lab Results  Component Value Date   ALT 8 09/29/2020   AST 18 09/29/2020   ALKPHOS 90 09/29/2020     Microbiology: Recent Results (from the past 240 hour(s))  Strep Gp B NAA     Status: None   Collection Time: 09/22/20  2:53 PM   Specimen: Genital   VR  Result Value Ref Range Status   Strep Gp B NAA Negative Negative Final    Comment: Centers for Disease Control and Prevention (CDC) and American Congress of Obstetricians and Gynecologists (ACOG) guidelines for prevention of perinatal group B streptococcal (GBS) disease specify co-collection of a vaginal and rectal swab specimen to maximize sensitivity of GBS detection. Per the CDC and ACOG, swabbing both the lower vagina and rectum substantially increases the yield of detection compared with sampling the vagina alone. Penicillin G, ampicillin, or cefazolin  are indicated for intrapartum prophylaxis of perinatal GBS colonization. Reflex susceptibility testing should be performed prior to use of clindamycin only on GBS isolates from penicillin-allergic women who are considered a high risk for anaphylaxis. Treatment with vancomycin without additional testing is warranted if resistance to clindamycin is noted.   SARS CORONAVIRUS 2 (TAT 6-24 HRS) Nasopharyngeal Nasopharyngeal Swab     Status: None   Collection Time: 09/27/20 10:39 AM   Specimen: Nasopharyngeal Swab  Result Value Ref Range Status   SARS Coronavirus 2 NEGATIVE NEGATIVE Final    Comment: (NOTE) SARS-CoV-2 target  nucleic acids are NOT DETECTED.  The SARS-CoV-2 RNA is generally detectable in upper and lower respiratory specimens during the acute phase of infection. Negative results do not preclude SARS-CoV-2 infection, do not rule out co-infections with other pathogens, and should not be used as the sole basis for treatment or other patient management decisions. Negative results must be combined with clinical observations, patient history, and epidemiological information. The expected result is Negative.  Fact Sheet for Patients: SugarRoll.be  Fact Sheet for Healthcare Providers: https://www.woods-mathews.com/  This test is not yet approved or cleared by the [place] FDA and  has been authorized for detection and/or diagnosis of SARS-CoV-2 by FDA under an Emergency Use Authorization (EUA). This EUA will remain  in effect (meaning this test can be used) for the duration of the COVID-19 declaration under Se ction 564(b)(1) of the Act, 21 U.S.C. section 360bbb-3(b)(1), unless the authorization is terminated or revoked sooner.  Performed at [facility], [place] [address], [place], [place] [address]     Xitlally Mooneyham W [place] [employer], [place] for Infectious Disease [facility] [email or web address] 10/01/2020,  3:30 PM

## 2020-10-01 NOTE — Progress Notes (Signed)
Patient ID: Kelly [name], female   DOB: 12-21-1985, 35 y.o.   MRN: [id number] CTSP by nursing d/t increased pain and uncontrollable diarrhea. Pt sitting on the toilet. Appears uncomfortable VSS  O2 sat on RA normal Will check labs. Ativan PRN Imodium x 1 dose due to potential cardiac affects. 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

## 2020-10-01 NOTE — Lactation Note (Signed)
This note was copied from a baby's chart. Lactation Consultation Note LC to [facility] for f/u visit. Per RN, mother is unwell. RN instructed LC to delay visit until mother is feeling better. Will plan f/u visit tomorrow.   Patient Name: Kelly [name] [id number] Date: 10/01/2020 Reason for consult: NICU baby;Follow-up assessment Age:36 hours  Feeding Mother's Current Feeding Choice: Breast Milk and Formula  Consult Status Consult Status: Follow-up Follow-up type: In-patient   Gwynne Edinger, MA IBCLC 10/01/2020, 3:19 PM

## 2020-10-01 NOTE — Progress Notes (Signed)
Post Partum Day 1 POD#1 BTL Subjective: Complaining of pain Otherwise doing well Couple of loose stools  Objective: Blood pressure 129/75, pulse (!) 106, temperature 98.2 F (36.8 C), temperature source Oral, resp. rate 18, SpO2 98 %, unknown if currently breastfeeding.  Physical Exam:  General: alert, cooperative and no distress Lochia: appropriate Uterine Fundus: firm Incision: healing well DVT Evaluation: No evidence of DVT seen on physical exam.  Recent Labs    09/29/20 1043  HGB 10.4*  HCT 31.9*    Assessment/Plan: Plan for discharge tomorrow Pain management challenges On methadone Check C dif toxin  LOS: 2 days   Florian Buff 10/01/2020, 9:15 AM

## 2020-10-02 MED ORDER — PROMETHAZINE HCL 25 MG PO TABS
25.0000 mg | ORAL_TABLET | Freq: Four times a day (QID) | ORAL | Status: DC | PRN
  Administered 2020-10-02 – 2020-10-04 (×7): 25 mg via ORAL
  Filled 2020-10-02 (×7): qty 1

## 2020-10-02 MED ORDER — LORAZEPAM 0.5 MG PO TABS
1.0000 mg | ORAL_TABLET | Freq: Four times a day (QID) | ORAL | Status: DC | PRN
  Administered 2020-10-02 – 2020-10-05 (×5): 1 mg via ORAL
  Filled 2020-10-02 (×6): qty 2

## 2020-10-02 MED ORDER — KETOROLAC TROMETHAMINE 10 MG PO TABS
10.0000 mg | ORAL_TABLET | Freq: Three times a day (TID) | ORAL | Status: DC
  Administered 2020-10-02 – 2020-10-05 (×10): 10 mg via ORAL
  Filled 2020-10-02 (×13): qty 1

## 2020-10-02 NOTE — Progress Notes (Signed)
Post Partum Day 2 POD#2 Subjective: Still lots of dairrhea Pain improved  Objective: Blood pressure 103/79, pulse 76, temperature 97.7 F (36.5 C), temperature source Oral, resp. rate 18, SpO2 99 %, unknown if currently breastfeeding.  Physical Exam:  General: alert, cooperative and no distress Lochia: appropriate Uterine Fundus: firm Incision: healing well DVT Evaluation: No evidence of DVT seen on physical exam.  Recent Labs    09/29/20 1043 10/01/20 1541  HGB 10.4* 8.5*  HCT 31.9* 26.0*    Assessment/Plan: Continue fluids while having diarrhea Continue oral vancomycin for C dificile toxin, I can't see that the lab I ordered was done, not sure what happened with that Anticipate being in hospital for 48 hours or so at minimum Cont enteric precautions   LOS: 3 days   Florian Buff 10/02/2020, 9:29 AM

## 2020-10-02 NOTE — Lactation Note (Signed)
This note was copied from a baby's chart. Lactation Consultation Note  Patient Name: [name] XNRCO'I Date: 10/02/2020   Age:36 hours   LC talked with RN, Waneta Martins, about Mother's status given her dx of C.Diff. Mom will not be breastfeeding at this time given her diagnosis. Infant urine tox negative and cord blood pending.     Maternal Data    Feeding    LATCH Score                    Lactation Tools Discussed/Used    Interventions    Discharge    Consult Status      Arlana Canizales  Nicholson-Springer 10/02/2020, 7:37 PM

## 2020-10-02 NOTE — Lactation Note (Deleted)
This note was copied from a baby's chart. Lactation Consultation Note Per RN Tye Savoy mom is not getting going home w/baby. Patient Name: Kelly [name] [id number]'[id number] Date: 10/02/2020   Age:36 hours  Maternal Data    Feeding    LATCH Score                    Lactation Tools Discussed/Used    Interventions    Discharge    Consult Status Consult Status: Complete Date: 10/02/20    Kelly [name] 10/02/2020, 9:59 PM

## 2020-10-02 NOTE — Plan of Care (Signed)
Pain under better control today as she has not complained as frequently. Diarrhea stools have decreased and patient states she does feel some better today because the stools have decreased.

## 2020-10-03 ENCOUNTER — Other Ambulatory Visit (HOSPITAL_COMMUNITY): Payer: Self-pay

## 2020-10-03 DIAGNOSIS — B9561 Methicillin susceptible Staphylococcus aureus infection as the cause of diseases classified elsewhere: Secondary | ICD-10-CM

## 2020-10-03 DIAGNOSIS — Z8619 Personal history of other infectious and parasitic diseases: Secondary | ICD-10-CM

## 2020-10-03 DIAGNOSIS — O10919 Unspecified pre-existing hypertension complicating pregnancy, unspecified trimester: Secondary | ICD-10-CM | POA: Diagnosis not present

## 2020-10-03 DIAGNOSIS — O98113 Syphilis complicating pregnancy, third trimester: Secondary | ICD-10-CM

## 2020-10-03 DIAGNOSIS — A0471 Enterocolitis due to Clostridium difficile, recurrent: Secondary | ICD-10-CM

## 2020-10-03 DIAGNOSIS — O34219 Maternal care for unspecified type scar from previous cesarean delivery: Secondary | ICD-10-CM | POA: Diagnosis not present

## 2020-10-03 DIAGNOSIS — I330 Acute and subacute infective endocarditis: Secondary | ICD-10-CM

## 2020-10-03 DIAGNOSIS — I071 Rheumatic tricuspid insufficiency: Secondary | ICD-10-CM

## 2020-10-03 LAB — SURGICAL PATHOLOGY

## 2020-10-03 LAB — T.PALLIDUM AB, TOTAL: T Pallidum Abs: REACTIVE — AB

## 2020-10-03 MED ORDER — FIDAXOMICIN 200 MG PO TABS
200.0000 mg | ORAL_TABLET | Freq: Two times a day (BID) | ORAL | Status: DC
  Administered 2020-10-03 – 2020-10-05 (×5): 200 mg via ORAL
  Filled 2020-10-03 (×6): qty 1

## 2020-10-03 NOTE — Progress Notes (Signed)
Post Partum Day 3, post op BTL day 3 as well Subjective: Diarrhea is improved Pain is better  Objective: Blood pressure 101/72, pulse 88, temperature 97.7 F (36.5 C), temperature source Axillary, resp. rate 18, SpO2 96 %, unknown if currently breastfeeding.  Physical Exam:  General: alert, cooperative and no distress Lochia: appropriate Uterine Fundus: firm Incision: healing well DVT Evaluation: No evidence of DVT seen on physical exam.  Recent Labs    10/01/20 1541  HGB 8.5*  HCT 26.0*    Assessment/Plan: Plan for discharge tomorrow Continue po vancomycin for presumed C dificile Dr Roselie Awkward to find out about visiting the NICU or patient   LOS: 4 days   Florian Buff 10/03/2020, 9:11 AM

## 2020-10-03 NOTE — Lactation Note (Signed)
This note was copied from a baby's chart. Lactation Consultation Note  Patient Name: Kelly [name] [id number] Date: 10/03/2020 Reason for consult: Follow-up assessment;Mother's request;NICU baby;Early term 37-38.6wks Age:36 hours   LC in to visit with P4 Mom of infant in the NICU.   Mom has SUD and is currently in treatment program.   Mom's and baby's UDS both negative.  Waiting on cord drug screen before feeding baby EBM.  Mom understands and desires to establish her milk supply.  Assisted Mom with first pumping, 27 mm flanges appear to be correct size currently.  Hand's free band provided for Mom.  Plan- 1- STS as able in NICU 2- Pump both breasts every 2-3 hrs during the day and 3-4 hrs at night. 3- Ask for assistance prn.  Mom provided with breast milk labels and washing and drying bins for cleaning pump parts.    [place] referral needed.  Mom plans to room in with baby after her discharge.  Lactation Tools Discussed/Used Tools: Pump;Flanges Flange Size: 27 Breast pump type: Double-Electric Breast Pump Pump Education: Setup, frequency, and cleaning;Milk Storage Reason for Pumping: Support milk supply, infant in NICU Pumping frequency: Q 3 hrs (started pumping at 78 hrs post partum) Pumped volume: 5 mL  Interventions Interventions: Breast feeding basics reviewed;Skin to skin;Breast massage;Hand express;DEBP;Education  Discharge WIC Program: Yes  Consult Status Consult Status: Follow-up Date: 10/04/20 Follow-up type: [place] 10/03/2020, 11:45 AM

## 2020-10-03 NOTE — Progress Notes (Addendum)
Subjective: No new complaints   Antibiotics:  Anti-infectives (From admission, onward)   Start     Dose/Rate Route Frequency Ordered Stop   10/03/20 1000  fidaxomicin (DIFICID) tablet 200 mg        200 mg Oral 2 times daily 10/03/20 0858 10/13/20 0959   10/01/20 1800  vancomycin (VANCOCIN) 50 mg/mL oral solution 125 mg  Status:  Discontinued        125 mg Oral Every 6 hours 10/01/20 1624 10/03/20 0858   09/29/20 1215  ceFAZolin (ANCEF) IVPB 2g/100 mL premix        2 g 200 mL/hr over 30 Minutes Intravenous On call to O.R. 09/29/20 1123 09/30/20 0511      Medications: Scheduled Meds: . docusate sodium  100 mg Oral BID  . fidaxomicin  200 mg Oral BID  . furosemide  40 mg Oral Daily  . ketorolac  10 mg Oral Q8H  . measles, mumps & rubella vaccine  0.5 mL Subcutaneous Once  . methadone  150 mg Oral Daily  . prenatal multivitamin  1 tablet Oral Q1200  . senna-docusate  2 tablet Oral Q24H  . sertraline  50 mg Oral Daily  . Tdap  0.5 mL Intramuscular Once  . zinc oxide   Topical BID   Continuous Infusions: PRN Meds:.acetaminophen, benzocaine-Menthol, coconut oil, witch hazel-glycerin **AND** dibucaine, LORazepam, oxyCODONE, promethazine, simethicone    Objective: Weight change:   Intake/Output Summary (Last 24 hours) at 10/03/2020 1152 Last data filed at 10/03/2020 4010 Gross per 24 hour  Intake 960 ml  Output 325 ml  Net 635 ml   Blood pressure 101/72, pulse 88, temperature 97.7 F (36.5 C), temperature source Axillary, resp. rate 18, SpO2 96 %, unknown if currently breastfeeding. Temp:  [97.7 F (36.5 C)-98 F (36.7 C)] 97.7 F (36.5 C) (05/23 0810) Pulse Rate:  [85-88] 88 (05/23 0810) Resp:  [17-18] 18 (05/23 0810) BP: (101-117)/(59-76) 101/72 (05/23 0810) SpO2:  [95 %-98 %] 96 % (05/23 0810)  Physical Exam: Physical Exam Constitutional:      General: She is not in acute distress.    Appearance: She is well-developed. She is not diaphoretic.  HENT:      Head: Normocephalic and atraumatic.     Right Ear: External ear normal.     Left Ear: External ear normal.     Mouth/Throat:     Pharynx: No oropharyngeal exudate.  Eyes:     General: No scleral icterus.    Extraocular Movements: Extraocular movements intact.     Conjunctiva/sclera: Conjunctivae normal.     Pupils: Pupils are equal, round, and reactive to light.  Cardiovascular:     Rate and Rhythm: Normal rate and regular rhythm.     Heart sounds: No murmur heard.   Pulmonary:     Effort: Pulmonary effort is normal. No respiratory distress.     Breath sounds: Normal breath sounds. No wheezing.  Abdominal:     General: Bowel sounds are normal. There is no distension.     Palpations: Abdomen is soft.     Tenderness: There is no abdominal tenderness.  Musculoskeletal:        General: No tenderness. Normal range of motion.  Lymphadenopathy:     Cervical: No cervical adenopathy.  Skin:    General: Skin is warm and dry.     Coloration: Skin is not pale.     Findings: No erythema or rash.  Neurological:  General: No focal deficit present.     Mental Status: She is alert and oriented to person, place, and time.     Motor: No abnormal muscle tone.  Psychiatric:        Attention and Perception: Attention normal.        Mood and Affect: Mood is anxious.        Behavior: Behavior normal.        Thought Content: Thought content normal.        Judgment: Judgment normal.      CBC:    BMET Recent Labs    10/01/20 1541  NA 133*  K 4.1  CL 104  CO2 22  GLUCOSE 111*  BUN 13  CREATININE 0.85  CALCIUM 8.4*     Liver Panel  Recent Labs    10/01/20 1541  PROT 5.2*  ALBUMIN 2.1*  AST 23  ALT 7  ALKPHOS 73  BILITOT 0.5       Sedimentation Rate No results for input(s): ESRSEDRATE in the last 72 hours. C-Reactive Protein No results for input(s): CRP in the last 72 hours.  Micro Results: Recent Results (from the past 720 hour(s))  Clostridium  Difficile by PCR([facility])     Status: Abnormal   Collection Time: 09/12/20  8:00 AM   Specimen: STOOL   ST  Result Value Ref Range Status   Toxigenic C. Difficile by PCR Positive (A) Negative Final    Comment: Toxigenic C difficile: Positive Epidemic Strain Bl/NAP1/027: Presumptive Negative   Strep Gp B NAA     Status: None   Collection Time: 09/22/20  2:53 PM   Specimen: Genital   VR  Result Value Ref Range Status   Strep Gp B NAA Negative Negative Final    Comment: Centers for Disease Control and Prevention (CDC) and American Congress of Obstetricians and Gynecologists (ACOG) guidelines for prevention of perinatal group B streptococcal (GBS) disease specify co-collection of a vaginal and rectal swab specimen to maximize sensitivity of GBS detection. Per the CDC and ACOG, swabbing both the lower vagina and rectum substantially increases the yield of detection compared with sampling the vagina alone. Penicillin G, ampicillin, or cefazolin are indicated for intrapartum prophylaxis of perinatal GBS colonization. Reflex susceptibility testing should be performed prior to use of clindamycin only on GBS isolates from penicillin-allergic women who are considered a high risk for anaphylaxis. Treatment with vancomycin without additional testing is warranted if resistance to clindamycin is noted.   SARS CORONAVIRUS 2 (TAT 6-24 HRS) Nasopharyngeal Nasopharyngeal Swab     Status: None   Collection Time: 09/27/20 10:39 AM   Specimen: Nasopharyngeal Swab  Result Value Ref Range Status   SARS Coronavirus 2 NEGATIVE NEGATIVE Final    Comment: (NOTE) SARS-CoV-2 target nucleic acids are NOT DETECTED.  The SARS-CoV-2 RNA is generally detectable in upper and lower respiratory specimens during the acute phase of infection. Negative results do not preclude SARS-CoV-2 infection, do not rule out co-infections with other pathogens, and should not be used as the sole basis for treatment or  other patient management decisions. Negative results must be combined with clinical observations, patient history, and epidemiological information. The expected result is Negative.  Fact Sheet for Patients: SugarRoll.be  Fact Sheet for Healthcare Providers: https://www.woods-mathews.com/  This test is not yet approved or cleared by the [place] FDA and  has been authorized for detection and/or diagnosis of SARS-CoV-2 by FDA under an Emergency Use Authorization (EUA). This EUA will remain  in effect (  meaning this test can be used) for the duration of the COVID-19 declaration under Se ction 564(b)(1) of the Act, 21 U.S.C. section 360bbb-3(b)(1), unless the authorization is terminated or revoked sooner.  Performed at [facility], [place] [address] [place], [place] [address]     Studies/Results: No results found.    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  Earlier in the month she was having watery diarrhea and tested positive for C. difficile by PCR.  She was  having greater than 10 bowel movements a day and improved on oral vancomycin.  Now she has had worsening after stopping vancomycin.  He is now improved again after 1 day of vancomycin having had 10 bowel movements a day and to just 1 or 2 in the last 24 hours.  #1 C. difficile colitis:  Will switch her to fidaxomicin and have her complete a 10-day course  #2 TV endocarditis: sp treatment   #3 HCV +: RNA negative  #4 Syphilis : sp treatment  #5 Risk for HIV. She would benefit from PrEP given her hx of STI's loading syphilis with particular tracks with HIV quite a bit her IV drug use as well put her at risk  I spent greater than 35  minutes with the patient including greater than 50% of time in face to face counsel of the patient guarding her C. difficile colitis or heart valve infection, reviewing her radiographs personally laboratory microbiologic data and in coordination of her care.    LOS: 4 days   Alcide Evener 10/03/2020, 11:52 AM

## 2020-10-03 NOTE — Progress Notes (Signed)
[place] social worker Levada Dy [place]) contacted CSW and requested update on infant, CSW provided update. CPS social worker reported that currently there are barriers to discharge. CPS social worker agreed to keep CSW updated.   CPS social worker requested to have CFT meeting Thursday 10/06/20 at [place] followed up with [name] at bedside. [name] agreeable to CFT meeting. CSW inquired about [name]'s discharge plan. [name] reported that she plans to stay with infant in the NICU. CSW inquired about [name]'s transportation to [facility] for daily dosing, [name] reported that she had not thought about it but believes that [facility] can provide transportation. CSW encouraged [name] to notify CSW if transportation needs arise as CSW can provide a 31 day bus pass, [name] agreed. CSW informed [name] about resources/supports available while infant is admitted to the NICU. [name] reported that meal vouchers would be helpful. CSW agreed to place meal vouchers at infant's bedside. [name] denied any additional needs/concerns. CSW encouraged [name] to contact CSW if any needs/concerns arise.   CSW placed 5 meal vouchers at infant's bedside.   Abundio Miu, [place] Worker [facility] Cell#: [phone]

## 2020-10-04 ENCOUNTER — Other Ambulatory Visit (HOSPITAL_COMMUNITY): Payer: Self-pay

## 2020-10-04 ENCOUNTER — Telehealth (HOSPITAL_COMMUNITY): Payer: Self-pay | Admitting: Licensed Clinical Social Worker

## 2020-10-04 ENCOUNTER — Telehealth: Payer: Self-pay

## 2020-10-04 DIAGNOSIS — I330 Acute and subacute infective endocarditis: Secondary | ICD-10-CM | POA: Diagnosis not present

## 2020-10-04 DIAGNOSIS — O34219 Maternal care for unspecified type scar from previous cesarean delivery: Secondary | ICD-10-CM | POA: Diagnosis not present

## 2020-10-04 DIAGNOSIS — Z8619 Personal history of other infectious and parasitic diseases: Secondary | ICD-10-CM | POA: Diagnosis not present

## 2020-10-04 DIAGNOSIS — O10919 Unspecified pre-existing hypertension complicating pregnancy, unspecified trimester: Secondary | ICD-10-CM | POA: Diagnosis not present

## 2020-10-04 MED ORDER — FIDAXOMICIN 200 MG PO TABS
200.0000 mg | ORAL_TABLET | Freq: Two times a day (BID) | ORAL | 0 refills | Status: DC
  Filled 2020-10-04: qty 14, 7d supply, fill #0

## 2020-10-04 NOTE — Telephone Encounter (Signed)
Patient enrolled into the Transportation Program 10/04/2020.   Kelly [name] DOB: 1985/10/19 MRN: [id number]   RIDER WAIVER AND RELEASE OF LIABILITY  For purposes of improving physical access to our facilities, [facility] is pleased to partner with third parties to provide [facility] patients or other authorized individuals the option of convenient, on-demand ground transportation services (the [place]") through use of the technology service that enables users to request on-demand ground transportation from independent third-party providers.  By opting to use and accept these [employer], I, the undersigned, hereby agree on behalf of myself, and on behalf of any minor child using the [employer] for whom I am the parent or legal guardian, as follows:  1. Government social research officer provided to me are provided by independent third-party transportation providers who are not [employer] or employees and who are unaffiliated with [employer]. 2. [facility] is neither a transportation carrier nor a common or public carrier. 3. [facility] has no control over the quality or safety of the transportation that occurs as a result of the [employer]. 4. [facility] cannot guarantee that any third-party transportation provider will complete any arranged transportation service. 5. [facility] makes no representation, warranty, or guarantee regarding the reliability, timeliness, quality, safety, suitability, or availability of any of the Transport Services or that they will be error free. 6. I fully understand that traveling by vehicle involves risks and dangers of serious bodily injury, including permanent disability, paralysis, and death. I agree, on behalf of myself and on behalf of any minor child using the Transport Services for whom I am the parent or legal guardian, that the entire risk arising out of my use of the [employer] remains solely with me, to the maximum  extent permitted under applicable law. 7. The [employer] are provided "as is" and "as available." [facility] disclaims all representations and warranties, express, implied or statutory, not expressly set out in these terms, including the implied warranties of merchantability and fitness for a particular purpose. 8. I hereby waive and release [facility], its agents, employees, officers, directors, representatives, insurers, attorneys, assigns, successors, subsidiaries, and affiliates from any and all past, present, or future claims, demands, liabilities, actions, causes of action, or suits of any kind directly or indirectly arising from acceptance and use of the [employer]. 9. I further waive and release  and its affiliates from all present and future liability and responsibility for any injury or death to persons or damages to property caused by or related to the use of the [employer]. 10. I have read this Waiver and Release of Liability, and I understand the terms used in it and their legal significance. This Waiver is freely and voluntarily given with the understanding that my right (as well as the right of any minor child for whom I am the parent or legal guardian using the [employer]) to legal recourse against  in connection with the [employer] is knowingly surrendered in return for use of these services.   I attest that I read the consent document to Kelly [name], gave Kelly [name] the opportunity to ask questions and answered the questions asked (if any). I affirm that [employer] then provided consent for she's participation in this program.     Kelly [name]

## 2020-10-04 NOTE — Progress Notes (Signed)
Subjective: No new complaints   Antibiotics:  Anti-infectives (From admission, onward)   Start     Dose/Rate Route Frequency Ordered Stop   10/04/20 0000  fidaxomicin (DIFICID) 200 MG TABS tablet        200 mg Oral 2 times daily 10/04/20 1027     10/03/20 1000  fidaxomicin (DIFICID) tablet 200 mg        200 mg Oral 2 times daily 10/03/20 0858 10/13/20 0959   10/01/20 1800  vancomycin (VANCOCIN) 50 mg/mL oral solution 125 mg  Status:  Discontinued        125 mg Oral Every 6 hours 10/01/20 1624 10/03/20 0858   09/29/20 1215  ceFAZolin (ANCEF) IVPB 2g/100 mL premix        2 g 200 mL/hr over 30 Minutes Intravenous On call to O.R. 09/29/20 1123 09/30/20 0511      Medications: Scheduled Meds: . docusate sodium  100 mg Oral BID  . fidaxomicin  200 mg Oral BID  . furosemide  40 mg Oral Daily  . ketorolac  10 mg Oral Q8H  . measles, mumps & rubella vaccine  0.5 mL Subcutaneous Once  . methadone  150 mg Oral Daily  . prenatal multivitamin  1 tablet Oral Q1200  . senna-docusate  2 tablet Oral Q24H  . sertraline  50 mg Oral Daily  . Tdap  0.5 mL Intramuscular Once  . zinc oxide   Topical BID   Continuous Infusions: PRN Meds:.acetaminophen, benzocaine-Menthol, coconut oil, witch hazel-glycerin **AND** dibucaine, LORazepam, oxyCODONE, promethazine, simethicone    Objective: Weight change:   Intake/Output Summary (Last 24 hours) at 10/04/2020 1437 Last data filed at 10/03/2020 1640 Gross per 24 hour  Intake 600 ml  Output 300 ml  Net 300 ml   Blood pressure 108/82, pulse 68, temperature 97.7 F (36.5 C), temperature source Oral, resp. rate 18, SpO2 99 %, unknown if currently breastfeeding. Temp:  [97.7 F (36.5 C)] 97.7 F (36.5 C) (05/24 0414) Pulse Rate:  [68-80] 68 (05/24 0414) Resp:  [17-18] 18 (05/24 0414) BP: (93-122)/(65-92) 108/82 (05/24 0414) SpO2:  [98 %-99 %] 99 % (05/24 0414)  Physical Exam: Physical Exam Constitutional:      General: She is not  in acute distress.    Appearance: She is well-developed. She is not diaphoretic.  HENT:     Head: Normocephalic and atraumatic.     Right Ear: External ear normal.     Left Ear: External ear normal.     Mouth/Throat:     Pharynx: No oropharyngeal exudate.  Eyes:     General: No scleral icterus.    Extraocular Movements: Extraocular movements intact.     Conjunctiva/sclera: Conjunctivae normal.     Pupils: Pupils are equal, round, and reactive to light.  Cardiovascular:     Rate and Rhythm: Normal rate and regular rhythm.     Heart sounds: No murmur heard.   Pulmonary:     Effort: Pulmonary effort is normal. No respiratory distress.     Breath sounds: Normal breath sounds. No wheezing.  Abdominal:     General: Bowel sounds are normal. There is no distension.     Palpations: Abdomen is soft.     Tenderness: There is no abdominal tenderness.  Musculoskeletal:        General: No tenderness. Normal range of motion.  Lymphadenopathy:     Cervical: No cervical adenopathy.  Skin:    General: Skin is warm and  dry.     Coloration: Skin is not pale.     Findings: No erythema or rash.  Neurological:     General: No focal deficit present.     Mental Status: She is alert and oriented to person, place, and time.     Motor: No abnormal muscle tone.  Psychiatric:        Attention and Perception: Attention normal.        Behavior: Behavior normal.        Thought Content: Thought content normal.        Judgment: Judgment normal.      CBC:    BMET Recent Labs    10/01/20 1541  NA 133*  K 4.1  CL 104  CO2 22  GLUCOSE 111*  BUN 13  CREATININE 0.85  CALCIUM 8.4*     Liver Panel  Recent Labs    10/01/20 1541  PROT 5.2*  ALBUMIN 2.1*  AST 23  ALT 7  ALKPHOS 73  BILITOT 0.5       Sedimentation Rate No results for input(s): ESRSEDRATE in the last 72 hours. C-Reactive Protein No results for input(s): CRP in the last 72 hours.  Micro Results: Recent Results  (from the past 720 hour(s))  Clostridium Difficile by PCR([facility])     Status: Abnormal   Collection Time: 09/12/20  8:00 AM   Specimen: STOOL   ST  Result Value Ref Range Status   Toxigenic C. Difficile by PCR Positive (A) Negative Final    Comment: Toxigenic C difficile: Positive Epidemic Strain Bl/NAP1/027: Presumptive Negative   Strep Gp B NAA     Status: None   Collection Time: 09/22/20  2:53 PM   Specimen: Genital   VR  Result Value Ref Range Status   Strep Gp B NAA Negative Negative Final    Comment: Centers for Disease Control and Prevention (CDC) and American Congress of Obstetricians and Gynecologists (ACOG) guidelines for prevention of perinatal group B streptococcal (GBS) disease specify co-collection of a vaginal and rectal swab specimen to maximize sensitivity of GBS detection. Per the CDC and ACOG, swabbing both the lower vagina and rectum substantially increases the yield of detection compared with sampling the vagina alone. Penicillin G, ampicillin, or cefazolin are indicated for intrapartum prophylaxis of perinatal GBS colonization. Reflex susceptibility testing should be performed prior to use of clindamycin only on GBS isolates from penicillin-allergic women who are considered a high risk for anaphylaxis. Treatment with vancomycin without additional testing is warranted if resistance to clindamycin is noted.   SARS CORONAVIRUS 2 (TAT 6-24 HRS) Nasopharyngeal Nasopharyngeal Swab     Status: None   Collection Time: 09/27/20 10:39 AM   Specimen: Nasopharyngeal Swab  Result Value Ref Range Status   SARS Coronavirus 2 NEGATIVE NEGATIVE Final    Comment: (NOTE) SARS-CoV-2 target nucleic acids are NOT DETECTED.  The SARS-CoV-2 RNA is generally detectable in upper and lower respiratory specimens during the acute phase of infection. Negative results do not preclude SARS-CoV-2 infection, do not rule out co-infections with other pathogens, and should not be  used as the sole basis for treatment or other patient management decisions. Negative results must be combined with clinical observations, patient history, and epidemiological information. The expected result is Negative.  Fact Sheet for Patients: SugarRoll.be  Fact Sheet for Healthcare Providers: https://www.woods-mathews.com/  This test is not yet approved or cleared by the [place] FDA and  has been authorized for detection and/or diagnosis of SARS-CoV-2 by FDA under  an Emergency Use Authorization (EUA). This EUA will remain  in effect (meaning this test can be used) for the duration of the COVID-19 declaration under Se ction 564(b)(1) of the Act, 21 U.S.C. section 360bbb-3(b)(1), unless the authorization is terminated or revoked sooner.  Performed at [facility], [place] [address], [place], [place] [address]     Studies/Results: No results found.    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  Earlier in  the month she was having watery diarrhea and tested positive for C. difficile by PCR.  She was having greater than 10 bowel movements a day and improved on oral vancomycin.  Now she has had worsening after stopping vancomycin.  He is now improved again after 1 day of vancomycin having had 10 bowel movements a day and to just 1 or 2 in the last 24 hours.  #1 C. difficile colitis:  Continue for fidaxomicin 10-day course  #2 TV endocarditis: sp treatment   #3 HCV +: RNA negative  #4 Syphilis : sp treatment  #5 Risk for HIV. She would benefit from PrEP given her hx of STI's loading syphilis with particular tracks with HIV quite a bit her IV drug use as well put her at risk  I would favor using LA IM Apretude for her given its superiority over oral PrEP  I spent greater than 35 minutes with the patient including greater than 50% of time in face to face counsel of the patient, reviewing radiographs, labs, microbiolobical data  and in coordination of her care.   Kelly [name] has an appointment on 10/20/2020 with Cassie Kuppelweiser at 145PM  She arrive at least 15 to 30 minutes prior to the appointment.  The [facility] is located in the [name] at  [address] in [place].  [address], which is located to the left of the elevators.  Phone: [phone]  Fax: [phone]  [email or web address]   I will sign off for now  Please call with further questions.    LOS: 5 days   Alcide Evener 10/04/2020, 2:37 PM

## 2020-10-04 NOTE — Progress Notes (Signed)
Post Partum Day 4 POD#4 PP BTL Subjective: Still with some pain issues Diarrhea mostly resolved Wants to wait and go home tomorrow  Objective: Blood pressure 108/82, pulse 68, temperature 97.7 F (36.5 C), temperature source Oral, resp. rate 18, SpO2 99 %, unknown if currently breastfeeding.  Physical Exam:  General: alert, cooperative and no distress Lochia: appropriate Uterine Fundus: firm Incision: healing well DVT Evaluation: No evidence of DVT seen on physical exam.  Recent Labs    10/01/20 1541  HGB 8.5*  HCT 26.0*    Assessment/Plan: Plan for discharge tomorrow  Cont fidaxomicin for C diff per Dr Tommy Medal   LOS: 5 days   Florian Buff 10/04/2020, 7:36 AM

## 2020-10-04 NOTE — Telephone Encounter (Signed)
Pt called CSW to inform that she is being discharged from the hospital tomorrow.  Pt is planning to remain in the NICU with her child until after she has her CPS meeting on Thursday to help determine plan for the infant which will help direct her which rehab or shelter option she will be eligible for.  Pt is worried about how she will get to her methadone appts on Thursday and Friday- she will get methadone in the hospital tomorrow but will need to get to the clinic Thursday and Friday.  Normally gets help from [name] but as she won't be staying there she will need assistance.  CSW able to set up ride through [employer] for those two days- she will plan to work with Medicaid to figure out transport for days following that.  Will continue to follow and assist as needed  [name], [facility] Desk#: [phone] Cell#: [phone]

## 2020-10-05 ENCOUNTER — Other Ambulatory Visit (HOSPITAL_COMMUNITY): Payer: Self-pay

## 2020-10-05 MED ORDER — NIFEDIPINE ER OSMOTIC RELEASE 30 MG PO TB24
30.0000 mg | ORAL_TABLET | Freq: Every day | ORAL | Status: DC
  Administered 2020-10-05: 30 mg via ORAL
  Filled 2020-10-05: qty 1

## 2020-10-05 MED ORDER — IBUPROFEN 600 MG PO TABS
600.0000 mg | ORAL_TABLET | Freq: Four times a day (QID) | ORAL | 1 refills | Status: DC | PRN
  Filled 2020-10-05: qty 30, 8d supply, fill #0

## 2020-10-05 MED ORDER — NIFEDIPINE ER 30 MG PO TB24
30.0000 mg | ORAL_TABLET | Freq: Every day | ORAL | 1 refills | Status: DC
  Filled 2020-10-05: qty 30, 30d supply, fill #0

## 2020-10-05 NOTE — Progress Notes (Signed)
Discharge instructions and prescriptions given to pt. Discussed signs and symptoms to report to the MD, upcoming appointments, and meds. Pt verbalizes understanding and has no questions at this time. Pt discharged from hospital in stable condition.

## 2020-10-05 NOTE — Progress Notes (Signed)
Advanced Heart Failure Rounding Note   Subjective:     Doing well post-delivery scheduled for d/c home today. Mild chronic edema LLE. No sob, orthopnea or PND. Vitals stable    Objective:   Weight Range:  Vital Signs:   Temp:  [97.7 F (36.5 C)-98.3 F (36.8 C)] 97.7 F (36.5 C) (05/25 0802) Pulse Rate:  [66-74] 67 (05/25 0802) Resp:  [16-18] 16 (05/25 0802) BP: (107-138)/(75-92) 138/89 (05/25 0802) SpO2:  [97 %-100 %] 99 % (05/25 0802)    Weight change:  Intake/Output:   Intake/Output Summary (Last 24 hours) at 10/05/2020 0834 Last data filed at 10/04/2020 2338 Gross per 24 hour  Intake 240 ml  Output 800 ml  Net -560 ml     Physical Exam: General:  Well appearing. No resp difficulty HEENT: normal poor dentition Neck: supple. JVP . Carotids 2+ bilat; no bruits. No lymphadenopathy or thryomegaly appreciated. Cor: PMI nondisplaced. Regular rate & rhythm. 2/6 TR Lungs: clear Abdomen: soft, nontender, nondistended. No hepatosplenomegaly. No bruits or masses. Good bowel sounds. Extremities: no cyanosis, clubbing, rash, tr edema on lefe Neuro: alert & orientedx3, cranial nerves grossly intact. moves all 4 extremities w/o difficulty. Affect pleasant   Labs: Basic Metabolic Panel: Recent Labs  Lab 09/29/20 1426 10/01/20 1541  NA 134* 133*  K 4.1 4.1  CL 100 104  CO2 26 22  GLUCOSE 92 111*  BUN 13 13  CREATININE 0.65 0.85  CALCIUM 9.0 8.4*    Liver Function Tests: Recent Labs  Lab 09/29/20 1426 10/01/20 1541  AST 18 23  ALT 8 7  ALKPHOS 90 73  BILITOT 0.4 0.5  PROT 6.9 5.2*  ALBUMIN 2.7* 2.1*   No results for input(s): LIPASE, AMYLASE in the last 168 hours. No results for input(s): AMMONIA in the last 168 hours.  CBC: Recent Labs  Lab 09/29/20 1043 10/01/20 1541  WBC 11.7* 12.2*  HGB 10.4* 8.5*  HCT 31.9* 26.0*  MCV 93.3 96.3  PLT 314 253    Cardiac Enzymes: No results for input(s): CKTOTAL, CKMB, CKMBINDEX, TROPONINI in the last  168 hours.  BNP: BNP (last 3 results) Recent Labs    07/13/20 1219 09/22/20 1205  BNP 157.1* 141.2*    ProBNP (last 3 results) No results for input(s): PROBNP in the last 8760 hours.    Other results:  Imaging:  No results found.   Medications:     Scheduled Medications: . docusate sodium  100 mg Oral BID  . fidaxomicin  200 mg Oral BID  . furosemide  40 mg Oral Daily  . ketorolac  10 mg Oral Q8H  . measles, mumps & rubella vaccine  0.5 mL Subcutaneous Once  . methadone  150 mg Oral Daily  . prenatal multivitamin  1 tablet Oral Q1200  . senna-docusate  2 tablet Oral Q24H  . sertraline  50 mg Oral Daily  . Tdap  0.5 mL Intramuscular Once  . zinc oxide   Topical BID     Infusions:   PRN Medications:  acetaminophen, benzocaine-Menthol, coconut oil, witch hazel-glycerin **AND** dibucaine, LORazepam, oxyCODONE, promethazine, simethicone   Assessment/Plan:   1. S/p vaginal birth  2. 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 IVDA currently in remission  Clinically stable. Main issue from cardiac perspective is severe TR and RV failure. No indication for b-blocker or other GDMT current. Continue diuretic for volume management. If not breastfeeding we can  eventually add spironolactone to help spare K with lasix.   I have discussed case with Dr. Kipp Brood. Will need repeat TEE prior to consideration of TV repair. I wil see her back in clinic in 1-2 months. If compliant with f/u and no relapse will schedule TEE and mover forward. Will also likely need repeat dental eval.     Length of Stay: 6   Glori Bickers MD 10/05/2020, 8:34 AM  Advanced Heart Failure Team Pager [phone] (M-F; [place])  Please contact [facility] for night-coverage after hours (4p -7a ) and weekends on [email or web address]

## 2020-10-05 NOTE — Lactation Note (Signed)
This note was copied from a baby's chart. Lactation Consultation Note Mother's milk is in. She has not pumped since last night and is trending toward engorgement. LC encouraged mother to pump q3. Reviewed CDC guidance for pump cleaning.   Patient Name: Boy Jilliann Subramanian MYTRZ'N Date: 10/05/2020 Reason for consult: NICU baby;Follow-up assessment Age:36 days   Feeding Mother's Current Feeding Choice: Formula Nipple Type: Dr. Myra Gianotti Preemie  Interventions Interventions: Education;DEBP  Discharge Discharge Education: Engorgement and breast care  Consult Status Consult Status: Follow-up Follow-up type: [place], MA IBCLC 10/05/2020, 8:46 AM

## 2020-10-05 NOTE — Progress Notes (Signed)
Tele monitor d/c'd.

## 2020-10-06 ENCOUNTER — Other Ambulatory Visit: Payer: Self-pay

## 2020-10-06 ENCOUNTER — Telehealth (HOSPITAL_COMMUNITY): Payer: Self-pay | Admitting: Licensed Clinical Social Worker

## 2020-10-06 NOTE — Progress Notes (Signed)
10/06/2020 9:00am - 10:45am: CSW participated in a child and family team meeting with CPS per the request of Kelly [name] for support. CSW communicated with the unit CSW, Joelene Millin to coordinate and provide assistance. The outcome and recommendation included filing a petition for non-secure custody of baby Kelly [name] and a court hearing will take place tomorrow. Kelly [name] will got to [name] today and visitation will be decided upon at court tomorrow. CSW will provide support as needed and will coordinate next steps with [facility] outpatient social worker.  Kelly [name], MSW, [facility] Social Worker

## 2020-10-06 NOTE — Telephone Encounter (Signed)
CSW informed by patient that after meeting with CPS this morning it was decided her infant would be taken into custody by CPS.  Patient will be allowed to be with the infant while they remain in the NICU as the petition for custody won't go into effect until after the infant is released from the NICU.  As she will not have the infant during rehab she will go to [facility]- she has already spoken to them and she can come on Wednesday next week.  [facility] at the [facility] is paying for a hotel for her stay in until Monday (5/30).  As patient will be able to also stay in the NICU with the infant unclear if she will need hotel placement for Monday and Tuesday at this time- will check with medical staff tomorrow to see if there is a projected DC date for the infant so we can plan accordingly.  [facility] transport updated to have pt being picked up from hotel tomorrow for her methadone treatment and pt has already arranged transportation with Medicaid to take her to treatment following that.  Will continue to follow and assist as needed  [name], [facility] Desk#: [phone] Cell#: [phone]

## 2020-10-07 ENCOUNTER — Ambulatory Visit: Payer: Medicaid Other

## 2020-10-12 ENCOUNTER — Telehealth: Payer: Self-pay | Admitting: Clinical

## 2020-10-12 NOTE — Telephone Encounter (Signed)
Attempt to f/u with pt postpartum; unable to leave voicemail as mailbox is full.

## 2020-10-13 ENCOUNTER — Other Ambulatory Visit: Payer: Self-pay

## 2020-10-13 ENCOUNTER — Telehealth (HOSPITAL_COMMUNITY): Payer: Self-pay | Admitting: Licensed Clinical Social Worker

## 2020-10-13 NOTE — Telephone Encounter (Signed)
CSW called pt to check in and make sure she was able to transition to [facility] yesterday as planned.  Unable to reach and unable to leave VM- sent text message to check in.  Will continue to follow and assist as needed  [name], [facility] Desk#: [phone] Cell#: [phone]

## 2020-10-13 NOTE — Telephone Encounter (Signed)
CSW returned CSW call and informed she is still planning on going to [facility] but as her son is still in the NICU at [facility] she is still staying with him.  Will need ride to methadone clinic tomorrow morning- CSW directed her to call [facility] as she is going from a [facility] facility.  Will continue to follow and assist as needed  [name], [facility] Desk#: [phone] Cell#: [phone]

## 2020-10-17 ENCOUNTER — Telehealth: Payer: Self-pay | Admitting: Clinical

## 2020-10-17 NOTE — Telephone Encounter (Signed)
Attempt to f/u with pt postpartum; unable to leave message as mailbox is full.

## 2020-10-20 ENCOUNTER — Other Ambulatory Visit: Payer: Self-pay

## 2020-10-20 ENCOUNTER — Telehealth (HOSPITAL_COMMUNITY): Payer: Self-pay | Admitting: *Deleted

## 2020-10-20 ENCOUNTER — Ambulatory Visit: Payer: Medicaid Other | Admitting: Pharmacist

## 2020-10-20 ENCOUNTER — Telehealth: Payer: Self-pay | Admitting: Clinical

## 2020-10-20 NOTE — Telephone Encounter (Signed)
Pt left vm requesting return call to [phone]. I attempted to reach pt and no answer.

## 2020-10-20 NOTE — Telephone Encounter (Signed)
Attempt to f/u with pt; unable to leave voice message as phone switched to busy signal only/no voicemail.

## 2020-10-26 ENCOUNTER — Inpatient Hospital Stay (HOSPITAL_COMMUNITY): Payer: Medicaid Other | Admitting: Certified Registered Nurse Anesthetist

## 2020-10-26 ENCOUNTER — Inpatient Hospital Stay (HOSPITAL_COMMUNITY)
Admission: AD | Admit: 2020-10-26 | Discharge: 2020-11-04 | DRG: 603 | Disposition: A | Discharge status: Home / Self Care | Payer: Medicaid Other | Source: Ambulatory Visit | Attending: Internal Medicine | Admitting: Internal Medicine

## 2020-10-26 ENCOUNTER — Ambulatory Visit (INDEPENDENT_AMBULATORY_CARE_PROVIDER_SITE_OTHER): Payer: Medicaid Other | Admitting: Pharmacist

## 2020-10-26 ENCOUNTER — Encounter: Payer: Self-pay | Admitting: Infectious Disease

## 2020-10-26 ENCOUNTER — Encounter (HOSPITAL_COMMUNITY)
Admission: AD | Disposition: A | Discharge status: Home / Self Care | Payer: Self-pay | Source: Ambulatory Visit | Attending: Internal Medicine

## 2020-10-26 ENCOUNTER — Other Ambulatory Visit: Payer: Self-pay

## 2020-10-26 ENCOUNTER — Telehealth: Payer: Self-pay

## 2020-10-26 ENCOUNTER — Inpatient Hospital Stay (HOSPITAL_COMMUNITY): Payer: Medicaid Other

## 2020-10-26 ENCOUNTER — Ambulatory Visit (INDEPENDENT_AMBULATORY_CARE_PROVIDER_SITE_OTHER): Payer: Medicaid Other | Admitting: Infectious Disease

## 2020-10-26 DIAGNOSIS — F1120 Opioid dependence, uncomplicated: Secondary | ICD-10-CM | POA: Diagnosis not present

## 2020-10-26 DIAGNOSIS — B9561 Methicillin susceptible Staphylococcus aureus infection as the cause of diseases classified elsewhere: Secondary | ICD-10-CM

## 2020-10-26 DIAGNOSIS — I071 Rheumatic tricuspid insufficiency: Secondary | ICD-10-CM | POA: Diagnosis present

## 2020-10-26 DIAGNOSIS — Z8679 Personal history of other diseases of the circulatory system: Secondary | ICD-10-CM

## 2020-10-26 DIAGNOSIS — M009 Pyogenic arthritis, unspecified: Secondary | ICD-10-CM | POA: Diagnosis not present

## 2020-10-26 DIAGNOSIS — I110 Hypertensive heart disease with heart failure: Secondary | ICD-10-CM | POA: Diagnosis not present

## 2020-10-26 DIAGNOSIS — L02419 Cutaneous abscess of limb, unspecified: Secondary | ICD-10-CM | POA: Diagnosis present

## 2020-10-26 DIAGNOSIS — R7881 Bacteremia: Secondary | ICD-10-CM | POA: Diagnosis not present

## 2020-10-26 DIAGNOSIS — Z9851 Tubal ligation status: Secondary | ICD-10-CM

## 2020-10-26 DIAGNOSIS — L02415 Cutaneous abscess of right lower limb: Secondary | ICD-10-CM | POA: Diagnosis present

## 2020-10-26 DIAGNOSIS — F1990 Other psychoactive substance use, unspecified, uncomplicated: Secondary | ICD-10-CM | POA: Diagnosis not present

## 2020-10-26 DIAGNOSIS — I5023 Acute on chronic systolic (congestive) heart failure: Secondary | ICD-10-CM | POA: Diagnosis present

## 2020-10-26 DIAGNOSIS — N898 Other specified noninflammatory disorders of vagina: Secondary | ICD-10-CM | POA: Diagnosis not present

## 2020-10-26 DIAGNOSIS — F988 Other specified behavioral and emotional disorders with onset usually occurring in childhood and adolescence: Secondary | ICD-10-CM | POA: Diagnosis present

## 2020-10-26 DIAGNOSIS — Z8249 Family history of ischemic heart disease and other diseases of the circulatory system: Secondary | ICD-10-CM

## 2020-10-26 DIAGNOSIS — Z8614 Personal history of Methicillin resistant Staphylococcus aureus infection: Secondary | ICD-10-CM

## 2020-10-26 DIAGNOSIS — B9562 Methicillin resistant Staphylococcus aureus infection as the cause of diseases classified elsewhere: Secondary | ICD-10-CM | POA: Diagnosis present

## 2020-10-26 DIAGNOSIS — I361 Nonrheumatic tricuspid (valve) insufficiency: Secondary | ICD-10-CM | POA: Diagnosis not present

## 2020-10-26 DIAGNOSIS — A539 Syphilis, unspecified: Secondary | ICD-10-CM | POA: Diagnosis present

## 2020-10-26 DIAGNOSIS — Z20822 Contact with and (suspected) exposure to covid-19: Secondary | ICD-10-CM | POA: Diagnosis not present

## 2020-10-26 DIAGNOSIS — I272 Pulmonary hypertension, unspecified: Secondary | ICD-10-CM | POA: Diagnosis not present

## 2020-10-26 DIAGNOSIS — I330 Acute and subacute infective endocarditis: Secondary | ICD-10-CM

## 2020-10-26 DIAGNOSIS — F319 Bipolar disorder, unspecified: Secondary | ICD-10-CM | POA: Diagnosis present

## 2020-10-26 DIAGNOSIS — L0291 Cutaneous abscess, unspecified: Secondary | ICD-10-CM | POA: Diagnosis not present

## 2020-10-26 DIAGNOSIS — Z87891 Personal history of nicotine dependence: Secondary | ICD-10-CM | POA: Diagnosis not present

## 2020-10-26 DIAGNOSIS — I5022 Chronic systolic (congestive) heart failure: Secondary | ICD-10-CM | POA: Diagnosis not present

## 2020-10-26 DIAGNOSIS — Z79899 Other long term (current) drug therapy: Secondary | ICD-10-CM

## 2020-10-26 DIAGNOSIS — A0472 Enterocolitis due to Clostridium difficile, not specified as recurrent: Secondary | ICD-10-CM

## 2020-10-26 DIAGNOSIS — Z9189 Other specified personal risk factors, not elsewhere classified: Secondary | ICD-10-CM

## 2020-10-26 DIAGNOSIS — Z8701 Personal history of pneumonia (recurrent): Secondary | ICD-10-CM | POA: Diagnosis not present

## 2020-10-26 DIAGNOSIS — N611 Abscess of the breast and nipple: Secondary | ICD-10-CM | POA: Diagnosis present

## 2020-10-26 DIAGNOSIS — L02512 Cutaneous abscess of left hand: Principal | ICD-10-CM | POA: Diagnosis present

## 2020-10-26 DIAGNOSIS — M79642 Pain in left hand: Secondary | ICD-10-CM | POA: Diagnosis present

## 2020-10-26 DIAGNOSIS — Z885 Allergy status to narcotic agent status: Secondary | ICD-10-CM

## 2020-10-26 DIAGNOSIS — Z8619 Personal history of other infectious and parasitic diseases: Secondary | ICD-10-CM

## 2020-10-26 DIAGNOSIS — R609 Edema, unspecified: Secondary | ICD-10-CM | POA: Diagnosis not present

## 2020-10-26 DIAGNOSIS — L02519 Cutaneous abscess of unspecified hand: Secondary | ICD-10-CM

## 2020-10-26 DIAGNOSIS — L02416 Cutaneous abscess of left lower limb: Secondary | ICD-10-CM | POA: Diagnosis not present

## 2020-10-26 DIAGNOSIS — Q211 Atrial septal defect: Secondary | ICD-10-CM | POA: Diagnosis not present

## 2020-10-26 DIAGNOSIS — Z9151 Personal history of suicidal behavior: Secondary | ICD-10-CM | POA: Diagnosis not present

## 2020-10-26 DIAGNOSIS — I38 Endocarditis, valve unspecified: Secondary | ICD-10-CM | POA: Diagnosis not present

## 2020-10-26 HISTORY — PX: MINOR IRRIGATION AND DEBRIDEMENT OF WOUND: SHX6239

## 2020-10-26 HISTORY — PX: I & D EXTREMITY: SHX5045

## 2020-10-26 HISTORY — DX: Pyogenic arthritis, unspecified: M00.9

## 2020-10-26 HISTORY — DX: Cutaneous abscess of limb, unspecified: L02.419

## 2020-10-26 HISTORY — DX: Other specified noninflammatory disorders of vagina: N89.8

## 2020-10-26 LAB — RESP PANEL BY RT-PCR (FLU A&B, COVID) ARPGX2
Influenza A by PCR: NEGATIVE
Influenza B by PCR: NEGATIVE
SARS Coronavirus 2 by RT PCR: NEGATIVE

## 2020-10-26 LAB — CBC
HCT: 28.1 % — ABNORMAL LOW (ref 36.0–46.0)
Hemoglobin: 8.9 g/dL — ABNORMAL LOW (ref 12.0–15.0)
MCH: 28.3 pg (ref 26.0–34.0)
MCHC: 31.7 g/dL (ref 30.0–36.0)
MCV: 89.5 fL (ref 80.0–100.0)
Platelets: 492 10*3/uL — ABNORMAL HIGH (ref 150–400)
RBC: 3.14 MIL/uL — ABNORMAL LOW (ref 3.87–5.11)
RDW: 14.7 % (ref 11.5–15.5)
WBC: 10.2 10*3/uL (ref 4.0–10.5)
nRBC: 0 % (ref 0.0–0.2)

## 2020-10-26 LAB — COMPREHENSIVE METABOLIC PANEL
ALT: 7 U/L (ref 0–44)
AST: 15 U/L (ref 15–41)
Albumin: 2.4 g/dL — ABNORMAL LOW (ref 3.5–5.0)
Alkaline Phosphatase: 95 U/L (ref 38–126)
Anion gap: 12 (ref 5–15)
BUN: 9 mg/dL (ref 6–20)
CO2: 31 mmol/L (ref 22–32)
Calcium: 8.3 mg/dL — ABNORMAL LOW (ref 8.9–10.3)
Chloride: 95 mmol/L — ABNORMAL LOW (ref 98–111)
Creatinine, Ser: 0.87 mg/dL (ref 0.44–1.00)
GFR, Estimated: >60 mL/min (ref >60)
Glucose, Bld: 87 mg/dL (ref 70–99)
Potassium: 3 mmol/L — ABNORMAL LOW (ref 3.5–5.1)
Sodium: 138 mmol/L (ref 135–145)
Total Bilirubin: 0.6 mg/dL (ref 0.3–1.2)
Total Protein: 6.1 g/dL — ABNORMAL LOW (ref 6.5–8.1)

## 2020-10-26 LAB — RAPID URINE DRUG SCREEN, HOSP PERFORMED
Amphetamines: NOT DETECTED
Barbiturates: NOT DETECTED
Benzodiazepines: NOT DETECTED
Cocaine: NOT DETECTED
Opiates: NOT DETECTED
Tetrahydrocannabinol: NOT DETECTED

## 2020-10-26 LAB — PREGNANCY, URINE: Preg Test, Ur: NEGATIVE — AB

## 2020-10-26 SURGERY — IRRIGATION AND DEBRIDEMENT EXTREMITY
Anesthesia: General | Site: Hand | Laterality: Left

## 2020-10-26 MED ORDER — SUCCINYLCHOLINE CHLORIDE 20 MG/ML IJ SOLN
INTRAMUSCULAR | Status: DC | PRN
  Administered 2020-10-26: 120 mg via INTRAVENOUS

## 2020-10-26 MED ORDER — METHADONE HCL 10 MG/5ML PO SOLN
140.0000 mg | Freq: Every day | ORAL | Status: DC
Start: 2020-10-26 — End: 2020-10-27

## 2020-10-26 MED ORDER — VANCOMYCIN HCL 1.25 G IV SOLR
1250.0000 mg | Freq: Two times a day (BID) | INTRAVENOUS | Status: DC
  Administered 2020-10-27 – 2020-10-30 (×7): 1250 mg via INTRAVENOUS
  Filled 2020-10-26 (×8): qty 1250

## 2020-10-26 MED ORDER — KETOROLAC TROMETHAMINE 30 MG/ML IJ SOLN
30.0000 mg | Freq: Once | INTRAMUSCULAR | Status: AC
  Administered 2020-10-26: 30 mg via INTRAVENOUS

## 2020-10-26 MED ORDER — COMPLETENATE 29-1 MG PO CHEW
1.0000 | CHEWABLE_TABLET | Freq: Every day | ORAL | Status: DC
  Administered 2020-10-27 – 2020-11-03 (×8): 1 via ORAL
  Filled 2020-10-26 (×9): qty 1

## 2020-10-26 MED ORDER — VANCOMYCIN HCL 1750 MG/350ML IV SOLN
1750.0000 mg | Freq: Once | INTRAVENOUS | Status: AC
  Administered 2020-10-26: 1750 mg via INTRAVENOUS
  Filled 2020-10-26: qty 350

## 2020-10-26 MED ORDER — ACETAMINOPHEN 10 MG/ML IV SOLN
1000.0000 mg | Freq: Once | INTRAVENOUS | Status: DC | PRN
  Administered 2020-10-26: 1000 mg via INTRAVENOUS

## 2020-10-26 MED ORDER — FENTANYL CITRATE (PF) 100 MCG/2ML IJ SOLN
INTRAMUSCULAR | Status: AC
  Filled 2020-10-26: qty 2

## 2020-10-26 MED ORDER — ONDANSETRON HCL 4 MG/2ML IJ SOLN
INTRAMUSCULAR | Status: DC | PRN
  Administered 2020-10-26: 4 mg via INTRAVENOUS

## 2020-10-26 MED ORDER — KETAMINE HCL 50 MG/5ML IJ SOSY
PREFILLED_SYRINGE | INTRAMUSCULAR | Status: AC
  Filled 2020-10-26: qty 5

## 2020-10-26 MED ORDER — FUROSEMIDE 40 MG PO TABS
40.0000 mg | ORAL_TABLET | Freq: Every day | ORAL | Status: DC
  Administered 2020-10-27 – 2020-11-01 (×6): 40 mg via ORAL
  Filled 2020-10-26 (×6): qty 1

## 2020-10-26 MED ORDER — FENTANYL CITRATE (PF) 100 MCG/2ML IJ SOLN
25.0000 ug | INTRAMUSCULAR | Status: DC | PRN
  Administered 2020-10-26 (×3): 50 ug via INTRAVENOUS

## 2020-10-26 MED ORDER — KETOROLAC TROMETHAMINE 30 MG/ML IJ SOLN
30.0000 mg | Freq: Four times a day (QID) | INTRAMUSCULAR | Status: AC | PRN
  Administered 2020-10-26 – 2020-10-31 (×7): 30 mg via INTRAVENOUS
  Filled 2020-10-26 (×10): qty 1

## 2020-10-26 MED ORDER — ACETAMINOPHEN 10 MG/ML IV SOLN
INTRAVENOUS | Status: AC
  Filled 2020-10-26: qty 100

## 2020-10-26 MED ORDER — DEXMEDETOMIDINE HCL IN NACL 200 MCG/50ML IV SOLN
INTRAVENOUS | Status: DC | PRN
  Administered 2020-10-26 (×2): 8 ug via INTRAVENOUS

## 2020-10-26 MED ORDER — 0.9 % SODIUM CHLORIDE (POUR BTL) OPTIME
TOPICAL | Status: DC | PRN
  Administered 2020-10-26: 1000 mL

## 2020-10-26 MED ORDER — HYDROMORPHONE HCL 1 MG/ML IJ SOLN
1.0000 mg | INTRAMUSCULAR | Status: DC | PRN
  Administered 2020-10-26: 1 mg via INTRAVENOUS
  Filled 2020-10-26: qty 1

## 2020-10-26 MED ORDER — PROPOFOL 10 MG/ML IV BOLUS
INTRAVENOUS | Status: AC
  Filled 2020-10-26: qty 20

## 2020-10-26 MED ORDER — PROPOFOL 10 MG/ML IV BOLUS
INTRAVENOUS | Status: DC | PRN
  Administered 2020-10-26: 200 mg via INTRAVENOUS

## 2020-10-26 MED ORDER — LACTATED RINGERS IV SOLN: INTRAVENOUS | Status: DC | PRN

## 2020-10-26 MED ORDER — KETAMINE HCL 10 MG/ML IJ SOLN
INTRAMUSCULAR | Status: DC | PRN
  Administered 2020-10-26 (×3): 10 mg via INTRAVENOUS
  Administered 2020-10-26: 20 mg via INTRAVENOUS

## 2020-10-26 MED ORDER — BUPIVACAINE HCL (PF) 0.25 % IJ SOLN
INTRAMUSCULAR | Status: AC
  Filled 2020-10-26: qty 30

## 2020-10-26 MED ORDER — ROCURONIUM BROMIDE 100 MG/10ML IV SOLN
INTRAVENOUS | Status: DC | PRN
  Administered 2020-10-26: 50 mg via INTRAVENOUS

## 2020-10-26 MED ORDER — MIDAZOLAM HCL 2 MG/2ML IJ SOLN
INTRAMUSCULAR | Status: AC
  Filled 2020-10-26: qty 2

## 2020-10-26 MED ORDER — RISAQUAD PO CAPS
2.0000 | ORAL_CAPSULE | Freq: Three times a day (TID) | ORAL | Status: DC
  Administered 2020-10-26 – 2020-11-04 (×25): 2 via ORAL
  Filled 2020-10-26 (×25): qty 2

## 2020-10-26 MED ORDER — FENTANYL CITRATE (PF) 250 MCG/5ML IJ SOLN
INTRAMUSCULAR | Status: AC
  Filled 2020-10-26: qty 5

## 2020-10-26 MED ORDER — MIDAZOLAM HCL 5 MG/5ML IJ SOLN
INTRAMUSCULAR | Status: DC | PRN
  Administered 2020-10-26: 2 mg via INTRAVENOUS

## 2020-10-26 MED ORDER — ACETAMINOPHEN 325 MG PO TABS: 650.0000 mg | ORAL_TABLET | Freq: Four times a day (QID) | ORAL | Status: DC | PRN

## 2020-10-26 MED ORDER — SUGAMMADEX SODIUM 200 MG/2ML IV SOLN
INTRAVENOUS | Status: DC | PRN
  Administered 2020-10-26: 200 mg via INTRAVENOUS

## 2020-10-26 MED ORDER — FENTANYL CITRATE (PF) 250 MCG/5ML IJ SOLN
INTRAMUSCULAR | Status: DC | PRN
  Administered 2020-10-26 (×3): 50 ug via INTRAVENOUS
  Administered 2020-10-26: 100 ug via INTRAVENOUS

## 2020-10-26 MED ORDER — NIFEDIPINE ER OSMOTIC RELEASE 30 MG PO TB24
30.0000 mg | ORAL_TABLET | Freq: Every day | ORAL | Status: DC
  Administered 2020-10-27 – 2020-11-04 (×9): 30 mg via ORAL
  Filled 2020-10-26 (×9): qty 1

## 2020-10-26 MED ORDER — SERTRALINE HCL 50 MG PO TABS
50.0000 mg | ORAL_TABLET | Freq: Every day | ORAL | Status: DC
  Administered 2020-10-27 – 2020-11-04 (×9): 50 mg via ORAL
  Filled 2020-10-26 (×9): qty 1

## 2020-10-26 MED ORDER — LIDOCAINE HCL (CARDIAC) PF 100 MG/5ML IV SOSY
PREFILLED_SYRINGE | INTRAVENOUS | Status: DC | PRN
  Administered 2020-10-26: 100 mg via INTRATRACHEAL

## 2020-10-26 MED ORDER — KETOROLAC TROMETHAMINE 30 MG/ML IJ SOLN
INTRAMUSCULAR | Status: AC
  Filled 2020-10-26: qty 1

## 2020-10-26 SURGICAL SUPPLY — 77 items
BLADE CLIPPER SURG (BLADE) IMPLANT
BNDG COHESIVE 1X5 TAN STRL LF (GAUZE/BANDAGES/DRESSINGS) IMPLANT
BNDG CONFORM 2 STRL LF (GAUZE/BANDAGES/DRESSINGS) IMPLANT
BNDG ELASTIC 3X5.8 VLCR STR LF (GAUZE/BANDAGES/DRESSINGS) ×3 IMPLANT
BNDG ELASTIC 4X5.8 VLCR STR LF (GAUZE/BANDAGES/DRESSINGS) ×3 IMPLANT
BNDG ESMARK 4X9 LF (GAUZE/BANDAGES/DRESSINGS) ×3 IMPLANT
BNDG GAUZE ELAST 4 BULKY (GAUZE/BANDAGES/DRESSINGS) ×6 IMPLANT
CANISTER SUCT 3000ML PPV (MISCELLANEOUS) ×3 IMPLANT
CORD BIPOLAR FORCEPS 12FT (ELECTRODE) ×3 IMPLANT
COVER MAYO STAND STRL (DRAPES) ×3 IMPLANT
COVER SURGICAL LIGHT HANDLE (MISCELLANEOUS) ×3 IMPLANT
COVER WAND RF STERILE (DRAPES) ×3 IMPLANT
CUFF TOURN SGL QUICK 18X4 (TOURNIQUET CUFF) ×6 IMPLANT
CUFF TOURN SGL QUICK 24 (TOURNIQUET CUFF)
CUFF TRNQT CYL 24X4X16.5-23 (TOURNIQUET CUFF) IMPLANT
DRAIN PENROSE 1/4X12 LTX STRL (WOUND CARE) IMPLANT
DRAPE LAPAROSCOPIC ABDOMINAL (DRAPES) IMPLANT
DRAPE LAPAROTOMY 100X72 PEDS (DRAPES) IMPLANT
DRAPE ORTHO SPLIT 77X108 STRL (DRAPES) ×3
DRAPE SURG 17X23 STRL (DRAPES) ×3 IMPLANT
DRAPE SURG ORHT 6 SPLT 77X108 (DRAPES) ×2 IMPLANT
DRAPE UTILITY XL STRL (DRAPES) ×3 IMPLANT
DRSG ADAPTIC 3X8 NADH LF (GAUZE/BANDAGES/DRESSINGS) ×3 IMPLANT
DRSG PAD ABDOMINAL 8X10 ST (GAUZE/BANDAGES/DRESSINGS) IMPLANT
ELECT REM PT RETURN 9FT ADLT (ELECTROSURGICAL) ×3
ELECTRODE REM PT RTRN 9FT ADLT (ELECTROSURGICAL) ×2 IMPLANT
GAUZE PACKING IODOFORM 1/2 (PACKING) IMPLANT
GAUZE PACKING IODOFORM 1/4X15 (PACKING) ×3 IMPLANT
GAUZE SPONGE 4X4 12PLY STRL (GAUZE/BANDAGES/DRESSINGS) ×15 IMPLANT
GAUZE XEROFORM 1X8 LF (GAUZE/BANDAGES/DRESSINGS) ×3 IMPLANT
GAUZE XEROFORM 5X9 LF (GAUZE/BANDAGES/DRESSINGS) IMPLANT
GLOVE BIO SURGEON STRL SZ7.5 (GLOVE) ×3 IMPLANT
GLOVE BIOGEL PI IND STRL 8.5 (GLOVE) ×2 IMPLANT
GLOVE BIOGEL PI INDICATOR 8.5 (GLOVE) ×1
GLOVE INDICATOR 8.0 STRL GRN (GLOVE) ×3 IMPLANT
GLOVE SURG ORTHO 8.0 STRL STRW (GLOVE) ×3 IMPLANT
GOWN STRL REUS W/ TWL LRG LVL3 (GOWN DISPOSABLE) ×6 IMPLANT
GOWN STRL REUS W/ TWL XL LVL3 (GOWN DISPOSABLE) ×4 IMPLANT
GOWN STRL REUS W/TWL LRG LVL3 (GOWN DISPOSABLE) ×9
GOWN STRL REUS W/TWL XL LVL3 (GOWN DISPOSABLE) ×6
HANDLE SUCTION POOLE (INSTRUMENTS) ×4 IMPLANT
HANDPIECE INTERPULSE COAX TIP (DISPOSABLE)
KIT BASIN OR (CUSTOM PROCEDURE TRAY) ×3 IMPLANT
KIT TURNOVER KIT B (KITS) ×3 IMPLANT
MANIFOLD NEPTUNE II (INSTRUMENTS) ×3 IMPLANT
NEEDLE HYPO 25GX1X1/2 BEV (NEEDLE) IMPLANT
NS IRRIG 1000ML POUR BTL (IV SOLUTION) ×3 IMPLANT
PACK GENERAL/GYN (CUSTOM PROCEDURE TRAY) IMPLANT
PACK LITHOTOMY IV (CUSTOM PROCEDURE TRAY) IMPLANT
PACK ORTHO EXTREMITY (CUSTOM PROCEDURE TRAY) ×3 IMPLANT
PAD ABD 7.5X8 STRL (GAUZE/BANDAGES/DRESSINGS) ×9 IMPLANT
PAD ARMBOARD 7.5X6 YLW CONV (MISCELLANEOUS) ×6 IMPLANT
PAD CAST 4YDX4 CTTN HI CHSV (CAST SUPPLIES) ×2 IMPLANT
PADDING CAST COTTON 4X4 STRL (CAST SUPPLIES) ×3
PENCIL SMOKE EVACUATOR (MISCELLANEOUS) ×3 IMPLANT
SET CYSTO W/LG BORE CLAMP LF (SET/KITS/TRAYS/PACK) IMPLANT
SET HNDPC FAN SPRY TIP SCT (DISPOSABLE) IMPLANT
SOAP 2 % CHG 4 OZ (WOUND CARE) ×3 IMPLANT
SPONGE LAP 18X18 RF (DISPOSABLE) ×3 IMPLANT
SPONGE LAP 4X18 RFD (DISPOSABLE) ×6 IMPLANT
SUCTION FRAZIER HANDLE 10FR (MISCELLANEOUS) ×3
SUCTION POOLE HANDLE (INSTRUMENTS) ×6
SUCTION TUBE FRAZIER 10FR DISP (MISCELLANEOUS) ×2 IMPLANT
SUT ETHILON 4 0 PS 2 18 (SUTURE) IMPLANT
SUT ETHILON 5 0 P 3 18 (SUTURE)
SUT NYLON ETHILON 5-0 P-3 1X18 (SUTURE) IMPLANT
SUT PROLENE 4 0 PS 2 18 (SUTURE) ×3 IMPLANT
SWAB COLLECTION DEVICE MRSA (MISCELLANEOUS) ×3 IMPLANT
SWAB CULTURE ESWAB REG 1ML (MISCELLANEOUS) IMPLANT
SYR BULB IRRIG 60ML STRL (SYRINGE) ×3 IMPLANT
SYR CONTROL 10ML LL (SYRINGE) IMPLANT
TOWEL GREEN STERILE (TOWEL DISPOSABLE) ×6 IMPLANT
TOWEL GREEN STERILE FF (TOWEL DISPOSABLE) ×3 IMPLANT
TUBE CONNECTING 12X1/4 (SUCTIONS) ×6 IMPLANT
UNDERPAD 30X36 HEAVY ABSORB (UNDERPADS AND DIAPERS) ×6 IMPLANT
WATER STERILE IRR 1000ML POUR (IV SOLUTION) ×3 IMPLANT
YANKAUER SUCT BULB TIP NO VENT (SUCTIONS) ×6 IMPLANT

## 2020-10-26 NOTE — Progress Notes (Signed)
Patient admitted to 37w32. Patient has multiple abscesses including bilateral thighs, L breast, and a red, raised, unopened bump on the top of L hand. Patient is complaining of pain 8/10 in these areas. Telemetry initiated and VS stable. Will keep NPO for now until surgery has been consulted.

## 2020-10-26 NOTE — Consult Note (Addendum)
CC: Multiple abscesses  Requesting provider: Dr Roosevelt Locks  HPI: Kelly [name] is an 35 y.o. female hx IVDA, multiple prior abscesses, endocarditis, heart failure, HTN, HCV, syphilis reports a 1.5 week hx of pain in her left breast, left wrist, right thigh, left thigh with increased swelling and pain. Denies fevers. Reports she is in methadone clinic and has not used any drugs aside from methadone since 3/2. She denies any trauma, needle sticks or injuries at any of these locations.  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 12/05/2017   Abscess of breast    rt breast   Abscess of right breast 07/13/2014   Acute cystitis without hematuria    ADD (attention deficit disorder)    Back pain    Bacteremia due to Enterococcus 06/15/2018   Bipolar 1 disorder (HCC)    Endocarditis 2020   Foreign body granuloma of soft tissue, NEC, right upper arm    History of hepatitis C 10/01/2018   Hypertension    Infection of left wrist ([place]) 10/26/2020   Influenza B 06/17/2018   Leukocytosis    Multifocal pneumonia 06/14/2018   Polysubstance abuse ([place])    Pyelonephritis affecting pregnancy 05/24/2016   Sepsis ([place]) 12/02/2017   Suicidal overdose ([place])    Thigh abscess 10/26/2020   Vaginal discharge 10/26/2020    Past Surgical History:  Procedure Laterality Date   BACK SURGERY     scoliosis-in care everywhere 08/1999   CESAREAN SECTION     CHOLECYSTECTOMY  March 2015   FOREIGN BODY REMOVAL Right 05/28/2016   Procedure: REMOVAL FOREIGN BODY RIGHT ARM;  Surgeon: Meredith Pel, MD;  Location: [place];  Service: Orthopedics;  Laterality: Right;   IRRIGATION AND DEBRIDEMENT ABSCESS Right 07/13/2014   Procedure: IRRIGATION AND DEBRIDEMEN TRIGHT BREAST ABSCESS;  Surgeon: Donnie Mesa, MD;  Location: [place];  Service: General;  Laterality: Right;   LUMBAR LAMINECTOMY/DECOMPRESSION MICRODISCECTOMY N/A 12/05/2017   Procedure: LUMBAR LAMINECTOMY FOR EPIDURAL ABSCESS;   Surgeon: Newman Pies, MD;  Location: [place];  Service: Neurosurgery;  Laterality: N/A;   MULTIPLE EXTRACTIONS WITH ALVEOLOPLASTY Bilateral 07/03/2018   Procedure: Extraction of tooth #'s 30 and 31 with alveoloplasty;  Surgeon: Lenn Cal, DDS;  Location: [place];  Service: Oral Surgery;  Laterality: Bilateral;   RADIOLOGY WITH ANESTHESIA N/A 12/05/2017   Procedure: MRI WITH ANESTHESIA OF [facility] AND LUMBAR SPINE WITH AND WITHOUT;  Surgeon: Radiologist, Medication, MD;  Location: [place];  Service: Radiology;  Laterality: N/A;   RIGHT/LEFT HEART CATH AND CORONARY ANGIOGRAPHY N/A 05/21/2019   Procedure: RIGHT/LEFT HEART CATH AND CORONARY ANGIOGRAPHY;  Surgeon: Jolaine Artist, MD;  Location: [place] CV LAB;  Service: Cardiovascular;  Laterality: N/A;   TEE WITHOUT CARDIOVERSION N/A 05/21/2019   Procedure: TRANSESOPHAGEAL ECHOCARDIOGRAM (TEE);  Surgeon: Jolaine Artist, MD;  Location: [facility] ENDOSCOPY;  Service: Cardiovascular;  Laterality: N/A;   TUBAL LIGATION Bilateral 09/30/2020   Procedure: POST PARTUM TUBAL LIGATION;  Surgeon: Osborne Oman, MD;  [facility]: [facility];  Service: Gynecology;  Laterality: Bilateral;    Family History  Problem Relation Age of Onset   Diabetes Mother    Diabetes Father    Hypertension Father    Heart attack Neg Hx    Hyperlipidemia Neg Hx    Sudden death Neg Hx     Social:  reports that she quit smoking about 2 years ago. Her smoking use included cigarettes. She has a 13.00  pack-year smoking history. She has never used smokeless tobacco. She reports previous alcohol use. She reports current drug use. Drugs: Cocaine, Heroin, IV, and Fentanyl.  Allergies:  Allergies  Allergen Reactions   Hydrocodone Itching   Morphine And Related Nausea And Vomiting    Medications: I have reviewed the patient's current medications.  No results found for this or any previous visit (from the past 48 hour(s)).  DG Chest 1 View  Result Date:  10/26/2020 CLINICAL DATA:  Cyst swelling EXAM: CHEST  1 VIEW COMPARISON:  07/15/2020 FINDINGS: Extensive spinal hardware. Linear atelectasis or scar at the right base. No consolidation, pleural effusion or pneumothorax. Borderline to mild cardiomegaly. IMPRESSION: No active disease. Borderline to mild cardiomegaly with linear atelectasis or scar at the right base Electronically Signed   By: Donavan Foil M.D.   On: 10/26/2020 17:07   DG Hand 2 View Left  Result Date: 10/26/2020 CLINICAL DATA:  Swelling. EXAM: LEFT HAND - 2 VIEW COMPARISON:  None. FINDINGS: There is no evidence of fracture or dislocation. There is no evidence of arthropathy or other focal bone abnormality. Dorsal soft tissue swelling. IMPRESSION: 1. No acute fracture or dislocation identified about the left hand. 2. Dorsal soft tissue swelling. Electronically Signed   By: Fidela Salisbury M.D.   On: 10/26/2020 16:58    ROS - all of the below systems have been reviewed with the patient and positives are indicated with bold text General: chills, fever or night sweats Eyes: blurry vision or double vision ENT: epistaxis or sore throat Allergy/Immunology: itchy/watery eyes or nasal congestion Hematologic/Lymphatic: bleeding problems, blood clots or swollen lymph nodes Endocrine: temperature intolerance or unexpected weight changes Breast: new or changing breast lumps or nipple discharge Resp: cough, shortness of breath, or wheezing CV: chest pain or dyspnea on exertion GI: as per HPI GU: dysuria, trouble voiding, or hematuria MSK: joint pain or joint stiffness Neuro: TIA or stroke symptoms Derm: pruritus and skin lesion changes Psych: anxiety and depression  PE Blood pressure 120/85, pulse 73, temperature 97.6 F (36.4 C), temperature source Oral, resp. rate 18, height 5' 7.5" (1.715 m), weight 85.8 kg, SpO2 98 %, unknown if currently breastfeeding. Constitutional: NAD; conversant; no deformities Eyes: Moist conjunctiva; no  lid lag; anicteric; PERRL Neck: Trachea midline; no thyromegaly Lungs: Normal respiratory effort; no tactile fremitus CV: RRR; no palpable thrills; no pitting edema GI: Abd soft, nt/nd; infraumbilical incision with dermabond; no palpable hepatosplenomegaly MSK: L breast medial to nipple induration and spontaneously decompressed appearing asbcess with induration; L wrist with fluctuanct abscess 2x2 cm; right thigh and left thigh abscesses each ~4 x 4 cm with fluctuance and erythema. Normal range of motion of extremities; no clubbing/cyanosis Psychiatric: Appropriate affect; alert and oriented x3 Lymphatic: No palpable cervical or axillary lymphadenopathy  **IV RN served as chaperone for this exam  No results found for this or any previous visit (from the past 59 hour(s)).  DG Chest 1 View  Result Date: 10/26/2020 CLINICAL DATA:  Cyst swelling EXAM: CHEST  1 VIEW COMPARISON:  07/15/2020 FINDINGS: Extensive spinal hardware. Linear atelectasis or scar at the right base. No consolidation, pleural effusion or pneumothorax. Borderline to mild cardiomegaly. IMPRESSION: No active disease. Borderline to mild cardiomegaly with linear atelectasis or scar at the right base Electronically Signed   By: Donavan Foil M.D.   On: 10/26/2020 17:07   DG Hand 2 View Left  Result Date: 10/26/2020 CLINICAL DATA:  Swelling. EXAM: LEFT HAND - 2 VIEW COMPARISON:  None.  FINDINGS: There is no evidence of fracture or dislocation. There is no evidence of arthropathy or other focal bone abnormality. Dorsal soft tissue swelling. IMPRESSION: 1. No acute fracture or dislocation identified about the left hand. 2. Dorsal soft tissue swelling. Electronically Signed   By: Fidela Salisbury M.D.   On: 10/26/2020 16:58     A/P: Kelly [name] is an 35 y.o. female with IVDA, multiple prior abscesses, endocarditis, heart failure, HTN, HCV, syphilis - now with multiple soft tissue abscesses  -Pregnancy test negative; she states  she is not breast feeding or lactating, did have child 1 month ago -Hand surgery is taking to OR this evening for I&D left wrist. We will plan to concurrently I&D her bilateral thigh abscesses as well as her left breast abscess that is already draining. -Continue IV abx as per ID -We discussed incision and drainage of left breast and bilateral thigh abscesses. The material risks including but not limited to bleeding, recurrence, need for additional drainage procedures. Wound expectations and potential for wound packing was discussed. She expressed understanding and all questions answered to her satisfaction, she expressed understanding and elected to proceed.  Nadeen Landau, MD [facility] Use [email or web address] to contact on call provider

## 2020-10-26 NOTE — Anesthesia Preprocedure Evaluation (Addendum)
Anesthesia Evaluation    Airway Mallampati: II  TM Distance: >3 FB Neck ROM: Full    Dental  (+) Chipped, Poor Dentition   Pulmonary neg pulmonary ROS, former smoker,    Pulmonary exam normal        Cardiovascular hypertension, Pt. on medications +CHF   Rhythm:Regular Rate:Normal  Recent endocarditis    Neuro/Psych Anxiety Depression Bipolar Disorder negative neurological ROS     GI/Hepatic (+)     substance abuse (on Methadone)  IV drug use, Hepatitis -, CColitis    Endo/Other  negative endocrine ROS  Renal/GU negative Renal ROS  negative genitourinary   Musculoskeletal  (+) Arthritis ,   Abdominal (+)  Abdomen: soft. Bowel sounds: normal.  Peds  Hematology negative hematology ROS (+)   Anesthesia Other Findings   Reproductive/Obstetrics                            Anesthesia Physical Anesthesia Plan  ASA: 3 and emergent  Anesthesia Plan: General   Post-op Pain Management:    Induction: Intravenous  PONV Risk Score and Plan: 3 and Ondansetron, Dexamethasone, Midazolam and Treatment may vary due to age or medical condition  Airway Management Planned: Mask and Oral ETT  Additional Equipment: None  Intra-op Plan:   Post-operative Plan: Extubation in OR  Informed Consent: I have reviewed the patients History and Physical, chart, labs and discussed the procedure including the risks, benefits and alternatives for the proposed anesthesia with the patient or authorized representative who has indicated his/her understanding and acceptance.     Dental advisory given  Plan Discussed with: CRNA  Anesthesia Plan Comments: (-DOS note: patient ate pizza and coke at 3pm, surgeon declaring case EMERGENT against ASA NPO guidelines at this time.   ECHO 05/22: 1. Left ventricular ejection fraction, by estimation, is 45 to 50%. The  left ventricle has mildly decreased function. The left  ventricle has no  regional wall motion abnormalities. Left ventricular diastolic parameters  were normal. There is the  interventricular septum is flattened in systole and diastole, consistent  with right ventricular pressure and volume overload.  2. Right ventricular systolic function is moderately reduced. The right  ventricular size is severely enlarged. There is mildly elevated pulmonary  artery systolic pressure.  3. The mitral valve is normal in structure. Trivial mitral valve  regurgitation. No evidence of mitral stenosis.  4. Tricuspid leaflets do not coapt well. There is very severe central TR.  . The tricuspid valve is abnormal. Tricuspid valve regurgitation is  severe.  5. The aortic valve is normal in structure. Aortic valve regurgitation is  not visualized. No aortic stenosis is present.  6. The inferior vena cava is normal in size with greater than 50%  respiratory variability, suggesting right atrial pressure of 3 mmHg.  Lab Results      Component                Value               Date                      WBC                      10.2                10/26/2020  HGB                      8.9 (L)             10/26/2020                HCT                      28.1 (L)            10/26/2020                MCV                      89.5                10/26/2020                PLT                      492 (H)             10/26/2020           Lab Results      Component                Value               Date                      NA                       138                 10/26/2020                K                        3.0 (L)             10/26/2020                CO2                      31                  10/26/2020                GLUCOSE                  87                  10/26/2020                BUN                      9                   10/26/2020                CREATININE               0.87                10/26/2020                CALCIUM  8.3 (L)             10/26/2020                GFRNONAA                 >60                 10/26/2020                GFRAA                    >60                 06/26/2019          )       Anesthesia Quick Evaluation

## 2020-10-26 NOTE — H&P (Signed)
History and Physical    Kelly [name] HYQ:[id number] DOB: Aug 16, 1985 DOA: 10/26/2020  PCP: [facility] (Confirm with patient/family/NH records and if not entered, this has to be entered at [facility] point of entry) Patient coming from: Home  I have personally briefly reviewed patient's old medical records in [place]  Chief Complaint: Abscess  HPI: Kelly [name] is a 35 y.o. female with medical history significant of IVDU, Hx of recent endocarditis, recent C. difficile colitis, chronic systolic CHF, HTN, moderate pulmonary hypertension, severe TR, methadone therapy, presented with multiple episodes.  Patient denied any recent IV drug use.  1 week ago, she started develop rash on bilateral sides and became abscess, and both abscess popped 3 to 4 days ago with thick yellowish pus coming out.  Same time, she also developed left breast swelling and pain, which eventually from a large abscess popped 2 days ago with large amount of pus.  Yesterday morning, she woke up with swelling pain of her left hand.  She has experienced chills and subjective fever for which she has been taking Tylenol and ibuprofen.  Today she went to see her infectious disease doctor, who sent her to the hospital.   Review of Systems: As per HPI otherwise 14 point review of systems negative.    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 12/05/2017   Abscess of breast    rt breast   Abscess of right breast 07/13/2014   Acute cystitis without hematuria    ADD (attention deficit disorder)    Back pain    Bacteremia due to Enterococcus 06/15/2018   Bipolar 1 disorder (HCC)    Endocarditis 2020   Foreign body granuloma of soft tissue, NEC, right upper arm    History of hepatitis C 10/01/2018   Hypertension    Infection of left wrist ([place]) 10/26/2020   Influenza B 06/17/2018   Leukocytosis    Multifocal pneumonia 06/14/2018   Polysubstance abuse ([place])     Pyelonephritis affecting pregnancy 05/24/2016   Sepsis ([place]) 12/02/2017   Suicidal overdose ([place])    Thigh abscess 10/26/2020   Vaginal discharge 10/26/2020    Past Surgical History:  Procedure Laterality Date   BACK SURGERY     scoliosis-in care everywhere 08/1999   CESAREAN SECTION     CHOLECYSTECTOMY  March 2015   FOREIGN BODY REMOVAL Right 05/28/2016   Procedure: REMOVAL FOREIGN BODY RIGHT ARM;  Surgeon: Meredith Pel, MD;  Location: [place];  Service: Orthopedics;  Laterality: Right;   IRRIGATION AND DEBRIDEMENT ABSCESS Right 07/13/2014   Procedure: IRRIGATION AND DEBRIDEMEN TRIGHT BREAST ABSCESS;  Surgeon: Donnie Mesa, MD;  Location: [place];  Service: General;  Laterality: Right;   LUMBAR LAMINECTOMY/DECOMPRESSION MICRODISCECTOMY N/A 12/05/2017   Procedure: LUMBAR LAMINECTOMY FOR EPIDURAL ABSCESS;  Surgeon: Newman Pies, MD;  Location: [place];  Service: Neurosurgery;  Laterality: N/A;   MULTIPLE EXTRACTIONS WITH ALVEOLOPLASTY Bilateral 07/03/2018   Procedure: Extraction of tooth #'s 30 and 31 with alveoloplasty;  Surgeon: Lenn Cal, DDS;  Location: [place];  Service: Oral Surgery;  Laterality: Bilateral;   RADIOLOGY WITH ANESTHESIA N/A 12/05/2017   Procedure: MRI WITH ANESTHESIA OF [facility] AND LUMBAR SPINE WITH AND WITHOUT;  Surgeon: Radiologist, Medication, MD;  Location: [place];  Service: Radiology;  Laterality: N/A;   RIGHT/LEFT HEART CATH AND CORONARY ANGIOGRAPHY N/A 05/21/2019   Procedure: RIGHT/LEFT HEART CATH AND CORONARY ANGIOGRAPHY;  Surgeon: Glori Bickers  R, MD;  Location: [place] CV LAB;  Service: Cardiovascular;  Laterality: N/A;   TEE WITHOUT CARDIOVERSION N/A 05/21/2019   Procedure: TRANSESOPHAGEAL ECHOCARDIOGRAM (TEE);  Surgeon: Jolaine Artist, MD;  [facility]: [facility] ENDOSCOPY;  Service: Cardiovascular;  Laterality: N/A;   TUBAL LIGATION Bilateral 09/30/2020   Procedure: POST PARTUM TUBAL LIGATION;  Surgeon: Osborne Oman, MD;  [facility]: [facility];  Service:  Gynecology;  Laterality: Bilateral;     reports that she quit smoking about 2 years ago. Her smoking use included cigarettes. She has a 13.00 pack-year smoking history. She has never used smokeless tobacco. She reports previous alcohol use. She reports current drug use. Drugs: Cocaine, Heroin, IV, and Fentanyl.  Allergies  Allergen Reactions   Hydrocodone Itching   Morphine And Related Nausea And Vomiting    Family History  Problem Relation Age of Onset   Diabetes Mother    Diabetes Father    Hypertension Father    Heart attack Neg Hx    Hyperlipidemia Neg Hx    Sudden death Neg Hx      Prior to Admission medications   Medication Sig Start Date End Date Taking? Authorizing Provider  fidaxomicin (DIFICID) 200 MG TABS tablet Take 1 tablet (200 mg total) by mouth 2 (two) times daily. 10/04/20   Woodroe Mode, MD  furosemide (LASIX) 40 MG tablet Take 1 tablet (40 mg total) by mouth daily. 08/26/20   Aletha Halim, MD  ibuprofen (ADVIL) 600 MG tablet Take 1 tablet (600 mg total) by mouth every 6 (six) hours as needed. 10/05/20   Woodroe Mode, MD  loperamide (IMODIUM) 2 MG capsule Take 1 capsule (2 mg total) by mouth 4 (four) times daily as needed for diarrhea or loose stools. 09/08/20   Chancy Milroy, MD  methadone (DOLOPHINE) 10 MG/5ML solution Take 140 mg by mouth daily.    [provider]  NIFEdipine (ADALAT CC) 30 MG 24 hr tablet Take 1 tablet (30 mg total) by mouth daily. 10/05/20   Woodroe Mode, MD  prenatal vitamin w/FE, FA (NATACHEW) 29-1 MG CHEW chewable tablet Chew 1 tablet by mouth daily at 12 noon. 08/25/20   Aletha Halim, MD  sertraline (ZOLOFT) 50 MG tablet Take 1 tablet (50 mg total) by mouth daily. 08/26/20   Aletha Halim, MD    Physical Exam: Vitals:   10/26/20 1607 10/26/20 1649  BP: 120/85   Pulse: 73   Resp: 18   Temp: 97.6 F (36.4 C)   TempSrc: Oral   SpO2: 98%   Weight:  85.8 kg  Height:  5' 7.5" (1.715 m)    Constitutional:  NAD, calm, comfortable Vitals:   10/26/20 1607 10/26/20 1649  BP: 120/85   Pulse: 73   Resp: 18   Temp: 97.6 F (36.4 C)   TempSrc: Oral   SpO2: 98%   Weight:  85.8 kg  Height:  5' 7.5" (1.715 m)   Eyes: PERRL, lids and conjunctivae normal ENMT: Mucous membranes are moist. Posterior pharynx clear of any exudate or lesions.Normal dentition.  Neck: normal, supple, no masses, no thyromegaly Respiratory: clear to auscultation bilaterally, no wheezing, no crackles. Normal respiratory effort. No accessory muscle use.  Cardiovascular: Regular rate and rhythm, no murmurs / rubs / gallops. No extremity edema. 2+ pedal pulses. No carotid bruits.  Abdomen: no tenderness, no masses palpated. No hepatosplenomegaly. Bowel sounds positive.  Musculoskeletal: no clubbing / cyanosis. No joint deformity upper and lower extremities. Good ROM, no contractures.  Normal muscle tone.  Skin: Multiple abscess on left breast, bilateral inner thigh, left hand.  Dorsal side of left hand swollen with a painful nodule on top. Neurologic: CN 2-12 grossly intact. Sensation intact, DTR normal. Strength 5/5 in all 4.  Psychiatric: Normal judgment and insight. Alert and oriented x 3. Normal mood.     Labs on Admission: I have personally reviewed following labs and imaging studies  CBC: No results for input(s): WBC, NEUTROABS, HGB, HCT, MCV, PLT in the last 168 hours. Basic Metabolic Panel: No results for input(s): NA, K, CL, CO2, GLUCOSE, BUN, CREATININE, CALCIUM, MG, PHOS in the last 168 hours. GFR: CrCl cannot be calculated (Patient's most recent lab result is older than the maximum 21 days allowed.). Liver Function Tests: No results for input(s): AST, ALT, ALKPHOS, BILITOT, PROT, ALBUMIN in the last 168 hours. No results for input(s): LIPASE, AMYLASE in the last 168 hours. No results for input(s): AMMONIA in the last 168 hours. Coagulation Profile: No results for input(s): INR, PROTIME in the last 168  hours. Cardiac Enzymes: No results for input(s): CKTOTAL, CKMB, CKMBINDEX, TROPONINI in the last 168 hours. BNP (last 3 results) No results for input(s): PROBNP in the last 8760 hours. HbA1C: No results for input(s): HGBA1C in the last 72 hours. CBG: No results for input(s): GLUCAP in the last 168 hours. Lipid Profile: No results for input(s): CHOL, HDL, LDLCALC, TRIG, CHOLHDL, LDLDIRECT in the last 72 hours. Thyroid Function Tests: No results for input(s): TSH, T4TOTAL, FREET4, T3FREE, THYROIDAB in the last 72 hours. Anemia Panel: No results for input(s): VITAMINB12, FOLATE, FERRITIN, TIBC, IRON, RETICCTPCT in the last 72 hours. Urine analysis:    Component Value Date/Time   COLORURINE AMBER (A) 07/13/2020 1320   APPEARANCEUR CLOUDY (A) 07/13/2020 1320   APPEARANCEUR Hazy 07/10/2013 1121   LABSPEC 1.028 07/13/2020 1320   LABSPEC 1.020 07/10/2013 1121   PHURINE 5.0 07/13/2020 1320   GLUCOSEU NEGATIVE 07/13/2020 1320   GLUCOSEU Negative 07/10/2013 1121   HGBUR SMALL (A) 07/13/2020 1320   BILIRUBINUR NEGATIVE 07/13/2020 1320   BILIRUBINUR Negative 07/10/2013 1121   KETONESUR 5 (A) 07/13/2020 1320   PROTEINUR 100 (A) 07/13/2020 1320   UROBILINOGEN 1.0 02/15/2015 0530   NITRITE POSITIVE (A) 07/13/2020 1320   LEUKOCYTESUR LARGE (A) 07/13/2020 1320   LEUKOCYTESUR Trace 07/10/2013 1121    Radiological Exams on Admission: DG Chest 1 View  Result Date: 10/26/2020 CLINICAL DATA:  Cyst swelling EXAM: CHEST  1 VIEW COMPARISON:  07/15/2020 FINDINGS: Extensive spinal hardware. Linear atelectasis or scar at the right base. No consolidation, pleural effusion or pneumothorax. Borderline to mild cardiomegaly. IMPRESSION: No active disease. Borderline to mild cardiomegaly with linear atelectasis or scar at the right base Electronically Signed   By: Donavan Foil M.D.   On: 10/26/2020 17:07   DG Hand 2 View Left  Result Date: 10/26/2020 CLINICAL DATA:  Swelling. EXAM: LEFT HAND - 2 VIEW  COMPARISON:  None. FINDINGS: There is no evidence of fracture or dislocation. There is no evidence of arthropathy or other focal bone abnormality. Dorsal soft tissue swelling. IMPRESSION: 1. No acute fracture or dislocation identified about the left hand. 2. Dorsal soft tissue swelling. Electronically Signed   By: Fidela Salisbury M.D.   On: 10/26/2020 16:58    EKG: Ordered  Assessment/Plan Active Problems:   Abscess   Leg abscess  (please populate well all problems here in Problem List. (For example, if patient is on BP meds at home and you resume  or decide to hold them, it is a problem that needs to be her. Same for CAD, COPD, HLD and so on)   Multiple skin abscesses -Suspect blood borne, and suspect patient lying about IVDU. -Discussed with infectious disease Dr. Drucilla Schmidt, recommend vancomycin for now.  Blood culture sent. -Consult general surgery for debridement of the left breast and bilateral thigh abscess.  Consult hand surgery to debride left hand abscess.  Ordered left hand x-ray.  Methadone therapy -Continue metoprolol  Chronic systolic CHF -Euvolemic, continue Lasix.  HTN -Continue nifedipine  DVT prophylaxis: SCD Code Status: Full code Family Communication: None at bedside Disposition Plan: Expect 1 to 2 days hospital stay for IV antibiotics and bedside versus OR I&D Consults called: General surgery, hand surgery Admission status: MedSurg admission   Lequita Halt MD [facility] Pager [phone]  10/26/2020, 5:18 PM

## 2020-10-26 NOTE — Progress Notes (Signed)
[facility] encountered pt.'s friend Daryel Gerald in [facility] waiting area while rounding; friend shared she had called pt. earlier this evening and pt. said she was being rushed to emergency heart surgery; friend came to hospital not aware that visitation ended at 8pm --> requested prayer for pt.'s surgery.    Lindaann Pascal, [place] Team Pager: [phone]

## 2020-10-26 NOTE — Op Note (Signed)
PREOPERATIVE DIAGNOSIS: Left hand dorsal abscess  POSTOPERATIVE DIAGNOSIS: Same  ATTENDING SURGEON: Dr. Iran Planas who scrubbed and present for the entire procedure  ASSISTANT SURGEON: None  ANESTHESIA: General via endotracheal tube  OPERATIVE PROCEDURE: Left hand incision and drainage of dorsal hand abscess  IMPLANTS: None  EBL: Minimal  RADIOGRAPHIC INTERPRETATION: None  SURGICAL INDICATIONS: Patient is a right-hand-dominant female with a persistent swelling and pain over the dorsal aspect of the hand.  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  Patient presented with multiple abscesses the left hand and both thighs and left breast.  Patient was taken the operating room to undergo the above procedures.  SURGICAL TECHNIQUE: Patient palpated by the preoperative holding area marked apart a marker made on left hand indicate correct operative site.  Patient brought back operating placed supine on the anesthesia table where the general anesthetic was administered.  Patient tolerated this well.  Well-padded tourniquet placed on left brachium and stay with appropriate drape.  Left upper extremities then prepped and draped normal sterile fashion.  A timeout was called the correct site identified procedure then begun.  Attention then turned to the left hand.  Longitudinal incision made directly over the dorsal aspect of the hand.  Dissection carried down through the skin and subcutaneous tissue with a gross purulent matter was then evacuated.  The patient did have the contained abscess over the dorsal aspect of the hand the abscess was then evacuated.  Copious wound irrigation done.  Thorough wound irrigation in the devitalized tissue was also removed.  After wound irrigation and debridement the abscess area was then packed with sterile packing gauze and closed loosely reapproximated with Prolene suture.  Adaptic dressing a sterile compressive bandage then applied.  The  patient was placed in a soft hand dressing explained taken recovery in good condition.  POSTOPERATIVE PLAN: Patient admitted back to the internal medicine service.  Change the bandage and dressing in 48 hours take out the packing gauze.  Continue with the inpatient care.  IV antibiotics as per the primary team and infectious disease.

## 2020-10-26 NOTE — Progress Notes (Signed)
Subjective:  Chief complaint pus coming from several different locations including left breast right thigh left thigh and erythematous area on left wrist   Patient ID: Kelly [name], female    DOB: [birth year], 35 y.o.   MRN: [id number]  HPI  35 y.o. 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  She also had tubal ligation.  The end of her course was complicated by C. difficile colitis relapsed after treatment with vancomycin which we treated with Dificid.  During her hospital stay she also came in with syphilis and I referred her to be seen in our clinic for consideration of preexposure prophylaxis against HIV.  She is on scheduled to see our infectious disease pharmacist Cassie Kuppelweiser but then upon being seen by [facility] she immediately showed her several areas of gross purulence that have been draining pus for several days.  She said she would intended to go to the hospital but instead since she knew she had a doctor's appointment thought she would wait to show the lesions here to [place] today.  Cassie came and got me and I came and examined the patient and I think she is going to clearly needs some surgical interventions.  Of course denies using IV drugs again and says go ahead and test me if you do not believe me which makes me wonder if fentanyl might be int he picture.      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 12/05/2017   Abscess of breast    rt breast   Abscess of right breast 07/13/2014   Acute cystitis without hematuria    ADD (attention deficit disorder)     Back pain    Bacteremia due to Enterococcus 06/15/2018   Bipolar 1 disorder (HCC)    Endocarditis 2020   Foreign body granuloma of soft tissue, NEC, right upper arm    History of hepatitis C 10/01/2018   Hypertension    Influenza B 06/17/2018   Leukocytosis    Multifocal pneumonia 06/14/2018   Polysubstance abuse ([place])    Pyelonephritis affecting pregnancy 05/24/2016   Sepsis ([place]) 12/02/2017   Suicidal overdose ([place])     Past Surgical History:  Procedure Laterality Date   BACK SURGERY     scoliosis-in care everywhere 08/1999   CESAREAN SECTION     CHOLECYSTECTOMY  March 2015   FOREIGN BODY REMOVAL Right 05/28/2016   Procedure: REMOVAL FOREIGN BODY RIGHT ARM;  Surgeon: Meredith Pel, MD;  Location: [place];  Service: Orthopedics;  Laterality: Right;   IRRIGATION AND DEBRIDEMENT ABSCESS Right 07/13/2014   Procedure: IRRIGATION AND DEBRIDEMEN TRIGHT BREAST ABSCESS;  Surgeon: Donnie Mesa, MD;  Location: [place];  Service: General;  Laterality: Right;   LUMBAR LAMINECTOMY/DECOMPRESSION MICRODISCECTOMY N/A 12/05/2017   Procedure: LUMBAR LAMINECTOMY FOR EPIDURAL ABSCESS;  Surgeon: Newman Pies, MD;  Location: [place];  Service: Neurosurgery;  Laterality: N/A;   MULTIPLE EXTRACTIONS WITH ALVEOLOPLASTY Bilateral 07/03/2018   Procedure: Extraction of tooth #'s 30 and 31 with alveoloplasty;  Surgeon: Lenn Cal, DDS;  Location: [place];  Service: Oral Surgery;  Laterality: Bilateral;  RADIOLOGY WITH ANESTHESIA N/A 12/05/2017   Procedure: MRI WITH ANESTHESIA OF [name] AND LUMBAR SPINE WITH AND WITHOUT;  Surgeon: Radiologist, Medication, MD;  Location: [place];  Service: Radiology;  Laterality: N/A;   RIGHT/LEFT HEART CATH AND CORONARY ANGIOGRAPHY N/A 05/21/2019   Procedure: RIGHT/LEFT HEART CATH AND CORONARY ANGIOGRAPHY;  Surgeon: Jolaine Artist, MD;  Location: [place] CV LAB;  Service: Cardiovascular;  Laterality: N/A;   TEE WITHOUT CARDIOVERSION N/A 05/21/2019   Procedure:  TRANSESOPHAGEAL ECHOCARDIOGRAM (TEE);  Surgeon: Jolaine Artist, MD;  Location[facility];  Service: Cardiovascular;  Laterality: N/A;   TUBAL LIGATION Bilateral 09/30/2020   Procedure: POST PARTUM TUBAL LIGATION;  Surgeon: Osborne Oman, MD;  Location: [facility] LD ORS;  Service: Gynecology;  Laterality: Bilateral;    Family History  Problem Relation Age of Onset   Diabetes Mother    Diabetes Father    Hypertension Father    Heart attack Neg Hx    Hyperlipidemia Neg Hx    Sudden death Neg Hx       Social History   Socioeconomic History   Marital status: Single    Spouse name: Not on file   Number of children: Not on file   Years of education: Not on file   Highest education level: Not on file  Occupational History   Occupation: unemployed  Tobacco Use   Smoking status: Former    Packs/day: 1.00    Years: 13.00    Pack years: 13.00    Types: Cigarettes    Quit date: 06/13/2018    Years since quitting: 2.3   Smokeless tobacco: Never  Vaping Use   Vaping Use: Never used  Substance and Sexual Activity   Alcohol use: Not Currently    Comment: one or two drinks weekly   Drug use: Yes    Types: Cocaine, Heroin, IV, Fentanyl    Comment: heroin, crack cocaine, fentanyl last used today (07/13/20)    Sexual activity: Yes    Birth control/protection: Condom  Other Topics Concern   Not on file  Social History Narrative   Not on file   Social Determinants of Health   Financial Resource Strain: High Risk   Difficulty of Paying Living Expenses: Very hard  Food Insecurity: No Food Insecurity   Worried About Charity fundraiser in the Last Year: Never true   Ran Out of Food in the Last Year: Never true  Transportation Needs: Unmet Transportation Needs   Lack of Transportation (Medical): Yes   Lack of Transportation (Non-Medical): Yes  Physical Activity: Not on file  Stress: Not on file  Social Connections: Not on file    Allergies  Allergen Reactions   Hydrocodone  Itching   Morphine And Related Nausea And Vomiting     Current Outpatient Medications:    fidaxomicin (DIFICID) 200 MG TABS tablet, Take 1 tablet (200 mg total) by mouth 2 (two) times daily., Disp: 14 tablet, Rfl: 0   furosemide (LASIX) 40 MG tablet, Take 1 tablet (40 mg total) by mouth daily., Disp: 30 tablet, Rfl: 2   ibuprofen (ADVIL) 600 MG tablet, Take 1 tablet (600 mg total) by mouth every 6 (six) hours as needed., Disp: 30 tablet, Rfl: 1   loperamide (IMODIUM) 2 MG capsule, Take 1 capsule (2 mg total) by mouth 4 (four) times daily as needed for diarrhea or loose stools., Disp: 30 capsule, Rfl: 1   methadone (DOLOPHINE) 10 MG/5ML solution, Take 140 mg by mouth daily., Disp: ,  Rfl:    NIFEdipine (ADALAT CC) 30 MG 24 hr tablet, Take 1 tablet (30 mg total) by mouth daily., Disp: 30 tablet, Rfl: 1   prenatal vitamin w/FE, FA (NATACHEW) 29-1 MG CHEW chewable tablet, Chew 1 tablet by mouth daily at 12 noon., Disp: 30 tablet, Rfl: 2   sertraline (ZOLOFT) 50 MG tablet, Take 1 tablet (50 mg total) by mouth daily., Disp: 30 tablet, Rfl: 1    Review of Systems  Constitutional:  Positive for fatigue and fever. Negative for chills.  HENT:  Negative for congestion and sore throat.   Eyes:  Negative for photophobia.  Respiratory:  Negative for cough, shortness of breath and wheezing.   Cardiovascular:  Negative for chest pain, palpitations and leg swelling.  Gastrointestinal:  Positive for nausea. Negative for abdominal pain, blood in stool, constipation, diarrhea and vomiting.  Genitourinary:  Positive for vaginal discharge. Negative for dysuria, flank pain and hematuria.  Musculoskeletal:  Negative for back pain and myalgias.  Skin:  Positive for color change and wound. Negative for rash.  Neurological:  Negative for dizziness, weakness and headaches.  Hematological:  Does not bruise/bleed easily.  Psychiatric/Behavioral:  Positive for dysphoric mood. Negative for confusion, decreased  concentration, hallucinations, sleep disturbance and suicidal ideas. The patient is nervous/anxious. The patient is not hyperactive.       Objective:   Physical Exam Constitutional:      General: She is not in acute distress.    Appearance: She is not diaphoretic.  HENT:     Head: Normocephalic and atraumatic.     Right Ear: External ear normal.     Left Ear: External ear normal.     Nose: Nose normal.     Mouth/Throat:     Pharynx: No oropharyngeal exudate.  Eyes:     General: No scleral icterus.       Right eye: No discharge.        Left eye: No discharge.     Extraocular Movements: Extraocular movements intact.     Conjunctiva/sclera: Conjunctivae normal.  Cardiovascular:     Rate and Rhythm: Regular rhythm. Tachycardia present.     Heart sounds: No murmur heard.   No friction rub. No gallop.  Pulmonary:     Effort: Pulmonary effort is normal. No respiratory distress.     Breath sounds: No wheezing or rales.  Abdominal:     General: There is no distension.     Palpations: Abdomen is soft.     Tenderness: There is no rebound.  Musculoskeletal:        General: No tenderness. Normal range of motion.     Cervical back: Normal range of motion and neck supple.  Lymphadenopathy:     Cervical: No cervical adenopathy.  Skin:    General: Skin is warm and dry.     Coloration: Skin is not jaundiced or pale.     Findings: No erythema, lesion or rash.  Neurological:     General: No focal deficit present.     Mental Status: She is alert and oriented to person, place, and time.     Coordination: Coordination normal.  Psychiatric:        Mood and Affect: Mood is anxious.        Speech: Speech normal.        Behavior: Behavior normal.        Thought Content: Thought content normal.        Cognition and Memory: Cognition normal.  Judgment: Judgment normal.        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  She also had tubal ligation.  End of her hospitalization complicated by C. difficile colitis that is been treated twice.  She was referred to our clinic for preexposure prophylaxis given that she had syphilis when we saw her in the hospital and is at risk for acquiring HIV.  She presented clinic with multiple abscesses including her left breast which is draining purulent foul-smelling material right and left thighs as well as an area of induration on her left wrist.  I told her that I suspect that she is using IV drugs again but she is adamant that she is not doing this and that she should be tested for drugs through the drug screen.  I do not think that these multiple abscesses are going to be treatable with oral antibiotics or even IV antibiotics absence of surgical intervention.  Multiple abscesses:  I think she needs to be admitted the hospital for surgical consultation for multiple abscesses and IV antibiotics until better control of infections.  I would recommend getting a set of blood cultures on admission  Have some fevers and malaise  She did asked me if her endocarditis was back.  I doubt it but it is worth getting blood cultures and she is someone who looks as if she is becoming more systemically ill.  Is however stable at present.  Vaginal discharge: She brought this up at the end of the visit when I was seeing her.  It would be prudent for her to be seen by gynecology of proper pelvic exam given her recent tubal ligation and the discharge that she is describing.  IV drug abuse: I suspect that she is relapsed  she is supposed to be in a home and this post to be preventing her from using drugs but given the multiple locations I would be surprised if she is not using drugs I would do a talk screen but also do a specific screen for fentanyl.  You have syphilis: She does need to be on preexposure prophylaxis against HIV in the future.  C. difficile colitis no evidence of recurrence at this point time.  I spent more than 40 minutes with the patient including greater than 50% of time in face to face counseling of the patient personally reviewing radiographs, long with pertinent laboratory microbiological data review of medical records and in coordination of her care.

## 2020-10-26 NOTE — Telephone Encounter (Signed)
Bed control contacted for admission due to multiple abscesses. Contact information for her facility manager Altha Harm Long was given to the coordinator as the patient does not have a phone.   Number is [phone]. Patient is aware that the hospital will call this afternoon/evening once bed is available.  Mikayela Deats Lorita Officer, RN

## 2020-10-26 NOTE — Progress Notes (Signed)
Called [facility] regarding pt covid test order (6-24hr) might not result in time for surgery. Called microbiology to have order changed to correct order. Per microbiology, order was changed and pt will not have to be reswabbed. Nurse caring for pt also informed short stay nurse that pt had 2 pieces of pepperoni pizza and 1 can of Dr. Malachi Bonds at 1500 today. Dr. Gloris Manchester and Dr. Caralyn Guile made aware. Per Dr. Caralyn Guile, surgery is emergent and they will proceed, Dr. Gloris Manchester aware. Nurse taking care of pt on 5W32 made aware.

## 2020-10-26 NOTE — Consult Note (Signed)
Reason for Consult: Left hand infection Referring Physician: [facility] hospitalist  Kelly [name] is an 35 y.o. female.  HPI: Patient is a right-hand-dominant female with a history of IV drug use and multiple prior abscesses and infections to include endocarditis heart failure syphilis hepatitis C.  Patient presented with worsening infections to both thighs breast and left hand.  I was consulted for management of the left hand infection.  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 12/05/2017   Abscess of breast    rt breast   Abscess of right breast 07/13/2014   Acute cystitis without hematuria    ADD (attention deficit disorder)    Back pain    Bacteremia due to Enterococcus 06/15/2018   Bipolar 1 disorder (HCC)    Endocarditis 2020   Foreign body granuloma of soft tissue, NEC, right upper arm    History of hepatitis C 10/01/2018   Hypertension    Infection of left wrist ([place]) 10/26/2020   Influenza B 06/17/2018   Leukocytosis    Multifocal pneumonia 06/14/2018   Polysubstance abuse ([place])    Pyelonephritis affecting pregnancy 05/24/2016   Sepsis ([place]) 12/02/2017   Suicidal overdose ([place])    Thigh abscess 10/26/2020   Vaginal discharge 10/26/2020    Past Surgical History:  Procedure Laterality Date   BACK SURGERY     scoliosis-in care everywhere 08/1999   CESAREAN SECTION     CHOLECYSTECTOMY  March 2015   FOREIGN BODY REMOVAL Right 05/28/2016   Procedure: REMOVAL FOREIGN BODY RIGHT ARM;  Surgeon: Meredith Pel, MD;  Location: [place];  Service: Orthopedics;  Laterality: Right;   IRRIGATION AND DEBRIDEMENT ABSCESS Right 07/13/2014   Procedure: IRRIGATION AND DEBRIDEMEN TRIGHT BREAST ABSCESS;  Surgeon: Donnie Mesa, MD;  Location: [place];  Service: General;  Laterality: Right;   LUMBAR LAMINECTOMY/DECOMPRESSION MICRODISCECTOMY N/A 12/05/2017   Procedure: LUMBAR LAMINECTOMY FOR EPIDURAL ABSCESS;  Surgeon: Newman Pies, MD;  Location: [place];   Service: Neurosurgery;  Laterality: N/A;   MULTIPLE EXTRACTIONS WITH ALVEOLOPLASTY Bilateral 07/03/2018   Procedure: Extraction of tooth #'s 30 and 31 with alveoloplasty;  Surgeon: Lenn Cal, DDS;  Location: [facility];  Service: Oral Surgery;  Laterality: Bilateral;   RADIOLOGY WITH ANESTHESIA N/A 12/05/2017   Procedure: MRI WITH ANESTHESIA OF [facility] AND LUMBAR SPINE WITH AND WITHOUT;  Surgeon: Radiologist, Medication, MD;  Location: [place];  Service: Radiology;  Laterality: N/A;   RIGHT/LEFT HEART CATH AND CORONARY ANGIOGRAPHY N/A 05/21/2019   Procedure: RIGHT/LEFT HEART CATH AND CORONARY ANGIOGRAPHY;  Surgeon: Jolaine Artist, MD;  Location: [place] [facility];  Service: Cardiovascular;  Laterality: N/A;   TEE WITHOUT CARDIOVERSION N/A 05/21/2019   Procedure: TRANSESOPHAGEAL ECHOCARDIOGRAM (TEE);  Surgeon: Jolaine Artist, MD;  Location: [facility] ENDOSCOPY;  Service: Cardiovascular;  Laterality: N/A;   TUBAL LIGATION Bilateral 09/30/2020   Procedure: POST PARTUM TUBAL LIGATION;  Surgeon: Osborne Oman, MD;  Location: [facility];  Service: Gynecology;  Laterality: Bilateral;    Family History  Problem Relation Age of Onset   Diabetes Mother    Diabetes Father    Hypertension Father    Heart attack Neg Hx    Hyperlipidemia Neg Hx    Sudden death Neg Hx     Social History:  reports that she quit smoking about 2 years ago. Her smoking use included cigarettes. She has a 13.00 pack-year smoking history. She has never used smokeless tobacco. She reports  previous alcohol use. She reports current drug use. Drugs: Cocaine, Heroin, IV, and Fentanyl.  Allergies:  Allergies  Allergen Reactions   Hydrocodone Itching   Morphine And Related Nausea And Vomiting    Medications: I have reviewed the patient's current medications.  Results for orders placed or performed during the hospital encounter of 10/26/20 (from the past 48 hour(s))  CBC     Status: Abnormal   Collection Time: 10/26/20  5:56  PM  Result Value Ref Range   WBC 10.2 4.0 - 10.5 K/uL   RBC 3.14 (L) 3.87 - 5.11 MIL/uL   Hemoglobin 8.9 (L) 12.0 - 15.0 g/dL   HCT 28.1 (L) 36.0 - 46.0 %   MCV 89.5 80.0 - 100.0 fL   MCH 28.3 26.0 - 34.0 pg   MCHC 31.7 30.0 - 36.0 g/dL   RDW 14.7 11.5 - 15.5 %   Platelets 492 (H) 150 - 400 K/uL   nRBC 0.0 0.0 - 0.2 %    Comment: Performed at [facility], [address], [place], [place] [address]  Comprehensive metabolic panel     Status: Abnormal   Collection Time: 10/26/20  5:56 PM  Result Value Ref Range   Sodium 138 135 - 145 mmol/L   Potassium 3.0 (L) 3.5 - 5.1 mmol/L   Chloride 95 (L) 98 - 111 mmol/L   CO2 31 22 - 32 mmol/L   Glucose, Bld 87 70 - 99 mg/dL    Comment: Glucose reference range applies only to samples taken after fasting for at least 8 hours.   BUN 9 6 - 20 mg/dL   Creatinine, Ser 0.87 0.44 - 1.00 mg/dL   Calcium 8.3 (L) 8.9 - 10.3 mg/dL   Total Protein 6.1 (L) 6.5 - 8.1 g/dL   Albumin 2.4 (L) 3.5 - 5.0 g/dL   AST 15 15 - 41 U/L   ALT 7 0 - 44 U/L   Alkaline Phosphatase 95 38 - 126 U/L   Total Bilirubin 0.6 0.3 - 1.2 mg/dL   GFR, Estimated >60 >60 mL/min    Comment: (NOTE) Calculated using the CKD-EPI Creatinine Equation (2021)    Anion gap 12 5 - 15    Comment: Performed at [facility], [place] [address] [place], [place] [address]  Pregnancy, urine     Status: Abnormal   Collection Time: 10/26/20  6:53 PM  Result Value Ref Range   Preg Test, Ur NEGATIVE (A) NEGATIVE    Comment:        THE SENSITIVITY OF THIS METHODOLOGY IS >20 mIU/mL. Performed at [facility], [place] [address], [place], [place] [address]   Urine rapid drug screen (hosp performed)     Status: None   Collection Time: 10/26/20  6:54 PM  Result Value Ref Range   Opiates NONE DETECTED NONE DETECTED   Cocaine NONE DETECTED NONE DETECTED   Benzodiazepines NONE DETECTED NONE DETECTED   Amphetamines NONE DETECTED NONE DETECTED   Tetrahydrocannabinol NONE DETECTED NONE  DETECTED   Barbiturates NONE DETECTED NONE DETECTED    Comment: (NOTE) DRUG SCREEN FOR MEDICAL PURPOSES ONLY.  IF CONFIRMATION IS NEEDED FOR ANY PURPOSE, NOTIFY LAB WITHIN 5 DAYS.  LOWEST DETECTABLE LIMITS FOR URINE DRUG SCREEN Drug Class                     Cutoff (ng/mL) Amphetamine and metabolites    1000 Barbiturate and metabolites    200 Benzodiazepine  062 Tricyclics and metabolites     300 Opiates and metabolites        300 Cocaine and metabolites        300 THC                            50 Performed at [facility], [place] [address], [place], [place] [address]   Resp Panel by RT-PCR (Flu A&B, Covid) Nasopharyngeal Swab     Status: None   Collection Time: 10/26/20  7:05 PM   Specimen: Nasopharyngeal Swab; Nasopharyngeal(NP) swabs in vial transport medium  Result Value Ref Range   SARS Coronavirus 2 by RT PCR NEGATIVE NEGATIVE    Comment: (NOTE) SARS-CoV-2 target nucleic acids are NOT DETECTED.  The SARS-CoV-2 RNA is generally detectable in upper respiratory specimens during the acute phase of infection. The lowest concentration of SARS-CoV-2 viral copies this assay can detect is 138 copies/mL. A negative result does not preclude SARS-Cov-2 infection and should not be used as the sole basis for treatment or other patient management decisions. A negative result may occur with  improper specimen collection/handling, submission of specimen other than nasopharyngeal swab, presence of viral mutation(s) within the areas targeted by this assay, and inadequate number of viral copies(<138 copies/mL). A negative result must be combined with clinical observations, patient history, and epidemiological information. The expected result is Negative.  Fact Sheet for Patients:  EntrepreneurPulse.com.au  Fact Sheet for Healthcare Providers:  IncredibleEmployment.be  This test is no t yet approved or cleared by the [place] FDA and  has been authorized for detection and/or diagnosis of SARS-CoV-2 by FDA under an Emergency Use Authorization (EUA). This EUA will remain  in effect (meaning this test can be used) for the duration of the COVID-19 declaration under Section 564(b)(1) of the Act, 21 U.S.C.section 360bbb-3(b)(1), unless the authorization is terminated  or revoked sooner.       Influenza A by PCR NEGATIVE NEGATIVE   Influenza B by PCR NEGATIVE NEGATIVE    Comment: (NOTE) The Xpert Xpress SARS-CoV-2/FLU/RSV plus assay is intended as an aid in the diagnosis of influenza from Nasopharyngeal swab specimens and should not be used as a sole basis for treatment. Nasal washings and aspirates are unacceptable for Xpert Xpress SARS-CoV-2/FLU/RSV testing.  Fact Sheet for Patients: EntrepreneurPulse.com.au  Fact Sheet for Healthcare Providers: IncredibleEmployment.be  This test is not yet approved or cleared by the [place] FDA and has been authorized for detection and/or diagnosis of SARS-CoV-2 by FDA under an Emergency Use Authorization (EUA). This EUA will remain in effect (meaning this test can be used) for the duration of the COVID-19 declaration under Section 564(b)(1) of the Act, 21 U.S.C. section 360bbb-3(b)(1), unless the authorization is terminated or revoked.  Performed at [facility], [place] [address] [place], [place] [address]     DG Chest 1 View  Result Date: 10/26/2020 CLINICAL DATA:  Cyst swelling EXAM: CHEST  1 VIEW COMPARISON:  07/15/2020 FINDINGS: Extensive spinal hardware. Linear atelectasis or scar at the right base. No consolidation, pleural effusion or pneumothorax. Borderline to mild cardiomegaly. IMPRESSION: No active disease. Borderline to mild cardiomegaly with linear atelectasis or scar at the right base Electronically Signed   By: Donavan Foil M.D.   On: 10/26/2020 17:07   DG Hand 2 View Left  Result Date:  10/26/2020 CLINICAL DATA:  Swelling. EXAM: LEFT HAND - 2 VIEW COMPARISON:  None. FINDINGS: There  is no evidence of fracture or dislocation. There is no evidence of arthropathy or other focal bone abnormality. Dorsal soft tissue swelling. IMPRESSION: 1. No acute fracture or dislocation identified about the left hand. 2. Dorsal soft tissue swelling. Electronically Signed   By: Fidela Salisbury M.D.   On: 10/26/2020 16:58    ROS as noted in the medical chart Blood pressure (!) 133/95, pulse (!) 58, temperature 98.4 F (36.9 C), temperature source Oral, resp. rate 20, height 5' 7.5" (1.715 m), weight 85.8 kg, SpO2 97 %, unknown if currently breastfeeding. Physical Exam General Appearance:  Alert, cooperative, no distress, appears stated age  Head:  Normocephalic, without obvious abnormality, atraumatic  Eyes:  Pupils equal, conjunctiva/corneas clear,            Neck: No visible masses     Lungs:   respirations unlabored  Chest Wall:  No tenderness or deformity  Heart:  Regular rate and rhythm,  Abdomen:   Soft, non-tender,         Extremities:  left hand the patient does have the dorsal abscess directly between the intermetacarpal's of the fourth and fifth.  She is able to extend her thumb and extend her digits she does not have any ascending erythema or lymphangitis.  She does not have any wounds over the volar aspect of the hand or forearm.  Pulses: 2+ and symmetric  Skin: Skin color, texture, turgor normal, no rashes or lesions     Neurologic: Normal    Assessment/Plan: Left hand abscess  Left hand incision and drainage  Patient was seen and evaluated tonight the patient does have the multiple infections will be working with general surgery who is going to be managing the abscesses over the legs and breast.  We will perform the care and treatment for the hand. Wound cultures will be taken.  The patient has a history of MRSA She will be continued on the inpatient medicine service  following surgery Patient voiced understanding the plan and the reason the rationale for the intervention tonight proceeding urgently with the multiple areas of infection.  Linna Hoff 10/26/2020, 8:37 PM

## 2020-10-26 NOTE — Anesthesia Procedure Notes (Signed)
Procedure Name: Intubation Date/Time: 10/26/2020 9:18 PM Performed by: Clovis Cao, CRNA Pre-anesthesia Checklist: Patient identified, Emergency Drugs available, Suction available and Patient being monitored Patient Re-evaluated:Patient Re-evaluated prior to induction Oxygen Delivery Method: Circle system utilized Preoxygenation: Pre-oxygenation with 100% oxygen Induction Type: IV induction, Rapid sequence and Cricoid Pressure applied Laryngoscope Size: Miller and 2 Grade View: Grade I Tube type: Oral Tube size: 7.0 mm Number of attempts: 1 Airway Equipment and Method: Stylet and Oral airway Placement Confirmation: ETT inserted through vocal cords under direct vision, positive ETCO2 and breath sounds checked- equal and bilateral Secured at: 21 cm Tube secured with: Tape Dental Injury: Teeth and Oropharynx as per pre-operative assessment

## 2020-10-26 NOTE — Progress Notes (Signed)
Pharmacy Antibiotic Note  Kelly [name] is a 35 y.o. 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  Pharmacy has been consulted for vancomycin dosing.  WBC 10.2, afebrile; Scr 0.87, CrCl 101.6 ml/min   Hand surgery is taking pt to OR this evening for I&D L wrist (also plan to concurrently I&D of bilateral thigh abscesses and L breast abscess)  Plan: Vancomycin 1750 mg IV X 1, followed by vancomycin 1250 mg IV Q 12 hrs (estimated vancomycin AUC with this regimen, using Sr 0.87, is 519.5; goal vancomycin AUC is 400-550) Monitor WBC, temp, clinical improvement, cultures, renal function, vancomycin levels as indicated  Height: 5' 7.5" (171.5 cm) Weight: 85.8 kg (189 lb 2.5 oz) IBW/kg (Calculated) : 62.75  Temp (24hrs), Avg:97.6 F (36.4 C), Min:97.6 F (36.4 C), Max:97.6 F (36.4 C)  Recent Labs  Lab 10/26/20 1756  WBC 10.2  CREATININE 0.87    CrCl cannot be calculated (Patient's most recent lab result is older than the maximum 21 days allowed.).    Allergies  Allergen Reactions   Hydrocodone Itching   Morphine And Related Nausea And Vomiting    Antimicrobials this admission: Vancomycin 6/15 >>  Microbiology results: No cultures ordered yet during this admission  Thank you for allowing pharmacy to be a part of this patient's care.  Gillermina Hu, PharmD, BCPS, [facility] Clinical Pharmacist 10/26/2020 5:05 PM

## 2020-10-26 NOTE — Op Note (Signed)
10/26/2020  9:22 PM  PATIENT:  Kelly [name]  35 y.o. female  Patient Care Team: Pa, [place] as PCP - General  PRE-OPERATIVE DIAGNOSIS: Abscess of left breast, left thigh, right thigh  POST-OPERATIVE DIAGNOSIS:  Same  PROCEDURE:   Incision and drainage of abscess of left breast, 5 x 5 cm Incision and drainage of abscess of left thigh, 3 x 3 cm Incision and drainage of abscess of right thigh, 3 x 3 cm  SURGEON:  Sharon [place]. Kenyana Husak, MD  ANESTHESIA:   general  COUNTS:  Sponge, needle and instrument counts were reported correct x2 at the conclusion of the operation.  EBL: 5 mL  DRAINS: none  SPECIMEN: wound cultures of right thigh abscess  COMPLICATIONS: none  FINDINGS: Left breast abscess with thickened/denuded skin with some spontaneous purulence draining.  This was opened to facilitate further drainage.  This measures approximately 5 x 5 cm.  There is no apparent mass.  Left thigh abscess 3 x 3 cm and right thigh abscess 3 x 3 cm both incised and drained.  DISPOSITION: PACU in satisfactory condition  DESCRIPTION: The patient was identified in preop holding and taken to the OR where She was placed on the operating room table. SCDs were placed. General endotracheal anesthesia was induced without difficulty. She was then prepped and draped in the usual sterile fashion. A surgical timeout was performed indicating the correct patient, procedure, positioning and need for preoperative antibiotics.   Beginning with the left breast, there is spontaneous drainage of pus.  This is opened.  The abscess cavity measures approximately 5 x 5 cm in size.  It contains approximately 20 cc of pus.  This is within the subcutaneous space.  This was all evacuated and the wound was irrigated.  Hemostasis is achieved electrocautery.  A dressing consisting of moist Kerlix, 4 x 4's, and ABD is placed.  Attention was then turned to the right thigh.  The abscess cavities identified and  incised in cruciate manner, then trimming the corners to facilitate drainage/healing.  5 cc of pus freely drained.  This is sent for culture.  The wound measures 3 x 3 cm.  This is within the subcutaneous space. the wound was irrigated.  Hemostasis is achieved electrocautery.  A dressing consisting of moist Kerlix, 4 x 4's, and ABD is placed.  Attention was then turned to the left thigh.  The abscess cavities identified and incised in cruciate manner, then trimming the corners to facilitate drainage/healing.  5 cc of pus freely drained.  This is sent for culture.  The wound measures 3 x 3 cm.  This is within the subcutaneous space. the wound was irrigated.  Hemostasis is achieved electrocautery.  A dressing consisting of moist Kerlix, 4 x 4's, and ABD is placed.   All sponge, needle and instrument counts were reported correct x2.  See Dr. Angus Palms note for details regarding his portion of the procedure.  She is awakened from anesthesia, extubated and transferred to PACU in satisfactory condition.

## 2020-10-26 NOTE — Transfer of Care (Signed)
Immediate Anesthesia Transfer of Care Note  Patient: Kelly [name]  Procedure(s) Performed: IRRIGATION AND DEBRIDEMENT LEFT WRIST (Left) MINOR IRRIGATION AND DEBRIDEMENT OF LEFT BREAST AND BILATERAL THIGHS (Left)  Patient Location: PACU  Anesthesia Type:General  Level of Consciousness: drowsy  Airway & Oxygen Therapy: Patient Spontanous Breathing  Post-op Assessment: Report given to RN and Post -op Vital signs reviewed and stable  Post vital signs: Reviewed and stable  Last Vitals:  Vitals Value Taken Time  BP    Temp    Pulse 70 10/26/20 2145  Resp 17 10/26/20 2145  SpO2 97 % 10/26/20 2145  Vitals shown include unvalidated device data.  Last Pain:  Vitals:   10/26/20 2010  TempSrc: Oral  PainSc:          Complications: No notable events documented.

## 2020-10-27 ENCOUNTER — Encounter (HOSPITAL_COMMUNITY): Payer: Self-pay | Admitting: Orthopedic Surgery

## 2020-10-27 DIAGNOSIS — L02419 Cutaneous abscess of limb, unspecified: Secondary | ICD-10-CM

## 2020-10-27 DIAGNOSIS — N611 Abscess of the breast and nipple: Secondary | ICD-10-CM

## 2020-10-27 DIAGNOSIS — L0291 Cutaneous abscess, unspecified: Secondary | ICD-10-CM

## 2020-10-27 LAB — CBC
HCT: 28.2 % — ABNORMAL LOW (ref 36.0–46.0)
Hemoglobin: 8.8 g/dL — ABNORMAL LOW (ref 12.0–15.0)
MCH: 28.3 pg (ref 26.0–34.0)
MCHC: 31.2 g/dL (ref 30.0–36.0)
MCV: 90.7 fL (ref 80.0–100.0)
Platelets: 446 10*3/uL — ABNORMAL HIGH (ref 150–400)
RBC: 3.11 MIL/uL — ABNORMAL LOW (ref 3.87–5.11)
RDW: 14.9 % (ref 11.5–15.5)
WBC: 8.8 10*3/uL (ref 4.0–10.5)
nRBC: 0 % (ref 0.0–0.2)

## 2020-10-27 LAB — BASIC METABOLIC PANEL
Anion gap: 13 (ref 5–15)
BUN: 9 mg/dL (ref 6–20)
CO2: 28 mmol/L (ref 22–32)
Calcium: 8.3 mg/dL — ABNORMAL LOW (ref 8.9–10.3)
Chloride: 96 mmol/L — ABNORMAL LOW (ref 98–111)
Creatinine, Ser: 0.94 mg/dL (ref 0.44–1.00)
GFR, Estimated: >60 mL/min (ref >60)
Glucose, Bld: 123 mg/dL — ABNORMAL HIGH (ref 70–99)
Potassium: 2.9 mmol/L — ABNORMAL LOW (ref 3.5–5.1)
Sodium: 137 mmol/L (ref 135–145)

## 2020-10-27 LAB — SURGICAL PCR SCREEN
MRSA, PCR: POSITIVE — AB
Staphylococcus aureus: POSITIVE — AB

## 2020-10-27 MED ORDER — HYDROMORPHONE HCL 1 MG/ML IJ SOLN
1.0000 mg | INTRAMUSCULAR | Status: DC | PRN
  Administered 2020-10-27 – 2020-11-04 (×35): 1 mg via INTRAVENOUS
  Filled 2020-10-27 (×35): qty 1

## 2020-10-27 MED ORDER — POTASSIUM CHLORIDE CRYS ER 20 MEQ PO TBCR
40.0000 meq | EXTENDED_RELEASE_TABLET | Freq: Once | ORAL | Status: AC
  Administered 2020-10-27: 40 meq via ORAL
  Filled 2020-10-27: qty 2

## 2020-10-27 MED ORDER — CHLORHEXIDINE GLUCONATE CLOTH 2 % EX PADS
6.0000 | MEDICATED_PAD | Freq: Every day | CUTANEOUS | Status: AC
  Administered 2020-10-29 – 2020-10-30 (×2): 6 via TOPICAL

## 2020-10-27 MED ORDER — MUPIROCIN 2 % EX OINT
1.0000 "application " | TOPICAL_OINTMENT | Freq: Two times a day (BID) | CUTANEOUS | Status: AC
  Administered 2020-10-27 – 2020-10-31 (×10): 1 via NASAL
  Filled 2020-10-27 (×3): qty 22

## 2020-10-27 MED ORDER — METHOCARBAMOL 500 MG PO TABS
500.0000 mg | ORAL_TABLET | Freq: Four times a day (QID) | ORAL | Status: DC | PRN
  Administered 2020-10-27 – 2020-11-02 (×3): 500 mg via ORAL
  Filled 2020-10-27 (×3): qty 1

## 2020-10-27 MED ORDER — METHADONE HCL 10 MG PO TABS
130.0000 mg | ORAL_TABLET | Freq: Every day | ORAL | Status: DC
  Administered 2020-10-27 – 2020-11-04 (×9): 130 mg via ORAL
  Filled 2020-10-27 (×9): qty 13

## 2020-10-27 NOTE — Plan of Care (Signed)
  Problem: Education: Goal: Knowledge of General Education information will improve Description Including pain rating scale, medication(s)/side effects and non-pharmacologic comfort measures Outcome: Progressing   Problem: Health Behavior/Discharge Planning: Goal: Ability to manage health-related needs will improve Outcome: Progressing   

## 2020-10-27 NOTE — Progress Notes (Signed)
Patient asleep at this time and having episodes of apnea with desaturation. O2 at 2L place on patient at this time.

## 2020-10-27 NOTE — Progress Notes (Signed)
Date:  10/27/2020   HPI: Kelly [name] is a 35 y.o. female who presents to the [facility] to discuss and initiate PrEP.  Insured   [x]    Uninsured  []    Patient Active Problem List   Diagnosis Date Noted   Thigh abscess 10/26/2020   Infection of left wrist ([place]) 10/26/2020   Vaginal discharge 10/26/2020   Abscess 10/26/2020   Leg abscess 10/26/2020   VBAC (vaginal birth after Cesarean) 09/30/2020   Chronic hypertension affecting pregnancy 09/29/2020   [redacted] weeks gestation of pregnancy 09/26/2020   Syphilis affecting pregnancy in third trimester 09/23/2020   C. 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   Tricuspid valve regurgitation 08/24/2020   Prolonged QT interval 08/24/2020   Pain, dental    Maternal endocarditis complicating pregnancy in third trimester    Methadone maintenance treatment affecting pregnancy ([place]) 07/21/2020   Drug use affecting pregnancy in second trimester    Septic embolism (HCC)    Syphilis    Cellulitis of right lower extremity    MRSA bacteremia 07/13/2020   Acute on chronic congestive heart failure ([place]) 07/13/2020   MDD (major depressive disorder), recurrent severe, 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   Oth psychoactive substance abuse w mood disorder ([place]) 06/26/2019   Congenital heart defect 10/07/2018   History of hepatitis C 10/01/2018   Unwanted fertility 37/48/2707   Chronic systolic heart failure ([place]) 08/29/2018   Infection due to Streptococcus mitis group 07/20/2018   History of VBAC 07/16/2018   Endocarditis 07/12/2018   Dental caries 07/03/2018   Phobia of dental procedure 07/03/2018   Preexisting hypertension complicating pregnancy, antepartum 06/29/2018   Supervision of other high  risk pregnancies, third trimester 06/27/2018   Rubella non-immune status, antepartum 06/23/2018   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) 06/14/2018   Tricuspid regurgitation 06/14/2018   Difficult intravenous access    IVDU (intravenous drug user)    Bipolar disorder ([employer]) 07/31/2016   Major depressive disorder, recurrent episode with anxious distress ([place]) 07/29/2016   Polysubstance abuse ([place]) 05/25/2016   No prenatal care in current pregnancy in third trimester 05/25/2016   Chronic hypertension during pregnancy, antepartum 05/25/2016   History of cesarean delivery affecting pregnancy 05/25/2016   Breast abscess 07/13/2014   Opioid use disorder, severe, dependence ([place]) 08/25/2013   Unspecified episodic mood disorder 08/25/2013   ADHD (attention deficit hyperactivity disorder) 08/25/2013   Cocaine abuse ([place]) 08/25/2013   Thoracic back pain 01/29/2013   Scoliosis 11/17/2012   CROHN'S DISEASE 05/03/2005   COLONIC POLYPS 02/28/2003    Patient's Medications  New Prescriptions   No medications on file  Previous Medications   FIDAXOMICIN (DIFICID) 200 MG TABS TABLET    Take 1 tablet (200 mg total) by mouth 2 (two) times daily.   FUROSEMIDE (LASIX) 40 MG TABLET    Take 1 tablet (40 mg total) by mouth daily.   IBUPROFEN (ADVIL) 600 MG TABLET    Take 1 tablet (600 mg total) by mouth every 6 (six) hours as needed.   LOPERAMIDE (IMODIUM) 2 MG CAPSULE    Take 1 capsule (2 mg total) by mouth 4 (four) times daily as needed for diarrhea or loose stools.   METHADONE (DOLOPHINE) 10  MG/5ML SOLUTION    Take 140 mg by mouth daily.   METHADONE HCL DISKETS PO    Take 130 mg by mouth in the morning.   NIFEDIPINE (ADALAT CC) 30 MG 24 HR TABLET    Take 1 tablet (30 mg total) by mouth daily.   PRENATAL VITAMIN W/FE, FA ([place]HEW) 29-1 MG CHEW CHEWABLE TABLET    Chew 1 tablet by mouth daily at 12 noon.   PROMETHAZINE  (PHENERGAN) 25 MG TABLET    Take 25 mg by mouth every 8 (eight) hours as needed for nausea or vomiting.   SERTRALINE (ZOLOFT) 50 MG TABLET    Take 1 tablet (50 mg total) by mouth daily.  Modified Medications   No medications on file  Discontinued Medications   No medications on file    Allergies: Allergies  Allergen Reactions   Hydrocodone Itching   Morphine And Related Nausea And Vomiting    Past Medical History: 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 12/05/2017   Abscess of breast    rt breast   Abscess of right breast 07/13/2014   Acute cystitis without hematuria    ADD (attention deficit disorder)    Back pain    Bacteremia due to Enterococcus 06/15/2018   Bipolar 1 disorder (HCC)    Endocarditis 2020   Foreign body granuloma of soft tissue, NEC, right upper arm    History of hepatitis C 10/01/2018   Hypertension    Infection of left wrist ([place]) 10/26/2020   Influenza B 06/17/2018   Leukocytosis    Multifocal pneumonia 06/14/2018   Polysubstance abuse ([place])    Pyelonephritis affecting pregnancy 05/24/2016   Sepsis ([place]) 12/02/2017   Suicidal overdose ([place])    Thigh abscess 10/26/2020   Vaginal discharge 10/26/2020    Social History: Social History   Socioeconomic History   Marital status: Single    Spouse name: Not on file   Number of children: Not on file   Years of education: Not on file   Highest education level: Not on file  Occupational History   Occupation: unemployed  Tobacco Use   Smoking status: Former    Packs/day: 1.00    Years: 13.00    Pack years: 13.00    Types: Cigarettes    Quit date: 06/13/2018    Years since quitting: 2.3   Smokeless tobacco: Never  Vaping Use   Vaping Use: Never used  Substance and Sexual Activity   Alcohol use: Not Currently    Comment: one or two drinks weekly   Drug use: Yes    Types: Cocaine, Heroin, IV, Fentanyl    Comment: heroin, crack cocaine, fentanyl last used today  (07/13/20)    Sexual activity: Yes    Birth control/protection: Condom  Other Topics Concern   Not on file  Social History Narrative   Not on file   Social Determinants of Health   Financial Resource Strain: High Risk   Difficulty of Paying Living Expenses: Very hard  Food Insecurity: No Food Insecurity   Worried About Charity fundraiser in the Last Year: Never true   Arboriculturist in the Last Year: Never true  Transportation Needs: Unmet Transportation Needs   Lack of Transportation (Medical): Yes   Lack of Transportation (Non-Medical): Yes  Physical Activity: Not on file  Stress: Not on file  Social Connections: Not on file    No flowsheet data found.  Labs:  SCr: Lab Results  Component Value Date   CREATININE 0.94 10/27/2020   CREATININE 0.87 10/26/2020   CREATININE 0.85 10/01/2020   CREATININE 0.65 09/29/2020   CREATININE 0.62 09/22/2020   HIV Lab Results  Component Value Date   HIV Non Reactive 07/13/2020   HIV Non Reactive 09/04/2018   HIV Non Reactive 06/20/2018   HIV Non Reactive 06/15/2018   HIV Non Reactive 12/04/2017   Hepatitis B Lab Results  Component Value Date   HEPBSAB Non Reactive 12/04/2017   HEPBSAG NON REACTIVE 07/26/2020   Hepatitis C No results found for: HEPCAB, HCVRNAPCRQN Hepatitis A Lab Results  Component Value Date   HAV Negative 12/04/2017   RPR and STI Lab Results  Component Value Date   LABRPR Reactive (A) 09/29/2020   LABRPR Reactive (A) 07/15/2020   LABRPR Non Reactive 09/04/2018   LABRPR Non Reactive 06/20/2018   LABRPR Non Reactive 12/04/2017    STI Results GC CT  09/22/2020 Negative Negative  08/07/2018 Negative Negative  03/27/2011 - NEGATIVE  11/23/2009 - NEGATIVE (NOTE)  Testing performed using the BD ProbeTec Qx Chlamydia trachomatis and Neisseria gonorrhea amplified DNA assay.  Performed at:  [facility] [address], [place] [address]               [id number]  10/22/2006 - NEGATIVE (NOTE)  Testing performed using the BD Probetec ET Chlamydia trachomatis and Neisseria gonorrhea amplified DNA assay.    Assessment: Kelly [name] is scheduled to see me today to discuss and initiate PrEP. She has a history of IV drug abuse and syphilis putting her at high risk of HIV acquisition, thus being a good candidate for PrEP. She does not know what she is here for initially but states that she has multiple abscesses that are leaking fluid.  She has one from her left breast that was draining pus with a fairly large hole in the center. She also has one on her left thigh, her right thigh, and her left hand.  I asked Dr. Tommy Medal to evaluate patient as I assume she will need to be admitted for I&D and culture of these abscesses. 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 I suspect she is using again and developed these abscesses from injecting. She does say she has been living in a sober house and has passed her drug screens.  Plan: - Dr. Tommy Medal to evaluate  Kelly [name] L. Airiel Oblinger, PharmD, BCIDP, AAHIVP, CPP Clinical Pharmacist Practitioner Infectious Diseases [facility] 10/27/2020, 8:41 AM

## 2020-10-27 NOTE — Consult Note (Signed)
[facility]       Reason for Consult: multiple abscesses    Referring Physician: Dr. Sloan Leiter  Active Problems:   Abscess   Leg abscess    acidophilus  2 capsule Oral TID   Chlorhexidine Gluconate Cloth  6 each Topical Q0600   furosemide  40 mg Oral Daily   methadone  140 mg Oral Daily   mupirocin ointment  1 application Nasal BID   NIFEdipine  30 mg Oral Daily   prenatal vitamin w/FE, FA  1 tablet Oral Q1200   sertraline  50 mg Oral Daily    Recommendations: Continue with vancomycin  Monitor cultures and will determine appropriate oral therapy at discharge  Assessment: She has multiple abscesses and cultures pending, currently with GPRs and GPC in the gram stain.    Antibiotics: Vancomycin day 2  HPI: Kelly [name] is a 35 y.o. female well known to the ID service with history of endocarditis, epidural lumbar abscess, who came to the ID clinic and noted multiple abscesses.  She was direct admitted and underwent surgical debridement by surgery (left breast, left thigh, right thigh) and orthopedics (left hand).  She denies any recent drug use.   No fever, no chills.    Review of Systems:  Constitutional: negative for fevers and chills Gastrointestinal: negative for nausea and diarrhea Musculoskeletal: negative for myalgias and arthralgias All other systems reviewed and are negative    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 12/05/2017   Abscess of breast    rt breast   Abscess of right breast 07/13/2014   Acute cystitis without hematuria    ADD (attention deficit disorder)    Back pain    Bacteremia due to Enterococcus 06/15/2018   Bipolar 1 disorder (HCC)    Endocarditis 2020   Foreign body granuloma of soft tissue, NEC, right upper arm    History of hepatitis C 10/01/2018   Hypertension    Infection of left wrist ([place]) 10/26/2020   Influenza B 06/17/2018   Leukocytosis    Multifocal  pneumonia 06/14/2018   Polysubstance abuse ([place])    Pyelonephritis affecting pregnancy 05/24/2016   Sepsis ([place]) 12/02/2017   Suicidal overdose ([place])    Thigh abscess 10/26/2020   Vaginal discharge 10/26/2020    Social History   Tobacco Use   Smoking status: Former    Packs/day: 1.00    Years: 13.00    Pack years: 13.00    Types: Cigarettes    Quit date: 06/13/2018    Years since quitting: 2.3   Smokeless tobacco: Never  Vaping Use   Vaping Use: Never used  Substance Use Topics   Alcohol use: Not Currently    Comment: one or two drinks weekly   Drug use: Yes    Types: Cocaine, Heroin, IV, Fentanyl    Comment: heroin, crack cocaine, fentanyl last used today (07/13/20)     Family History  Problem Relation Age of Onset   Diabetes Mother    Diabetes Father    Hypertension Father    Heart attack Neg Hx    Hyperlipidemia Neg Hx    Sudden death Neg Hx     Allergies  Allergen Reactions   Hydrocodone Itching   Morphine And Related Nausea And Vomiting    Physical Exam: Constitutional: in no apparent distress  Vitals:   10/27/20 0415 10/27/20 0810  BP: (!) 144/99 (!) 146/90  Pulse: Marland Kitchen)  52 61  Resp: 16 13  Temp: 98 F (36.7 C) 98.2 F (36.8 C)  SpO2: 99% 97%   EYES: anicteric ENMT: no thrush Cardiovascular: Cor RRR Respiratory: clear; GI: Bowel sounds are normal, liver is not enlarged, spleen is not enlarged Neuro: non-focal  Lab Results  Component Value Date   WBC 8.8 10/27/2020   HGB 8.8 (L) 10/27/2020   HCT 28.2 (L) 10/27/2020   MCV 90.7 10/27/2020   PLT 446 (H) 10/27/2020    Lab Results  Component Value Date   CREATININE 0.94 10/27/2020   BUN 9 10/27/2020   NA 137 10/27/2020   K 2.9 (L) 10/27/2020   CL 96 (L) 10/27/2020   CO2 28 10/27/2020    Lab Results  Component Value Date   ALT 7 10/26/2020   AST 15 10/26/2020   ALKPHOS 95 10/26/2020     Microbiology: Recent Results (from the past 240 hour(s))  Resp Panel by RT-PCR (Flu A&B, Covid)  Nasopharyngeal Swab     Status: None   Collection Time: 10/26/20  7:05 PM   Specimen: Nasopharyngeal Swab; Nasopharyngeal(NP) swabs in vial transport medium  Result Value Ref Range Status   SARS Coronavirus 2 by RT PCR NEGATIVE NEGATIVE Final    Comment: (NOTE) SARS-CoV-2 target nucleic acids are NOT DETECTED.  The SARS-CoV-2 RNA is generally detectable in upper respiratory specimens during the acute phase of infection. The lowest concentration of SARS-CoV-2 viral copies this assay can detect is 138 copies/mL. A negative result does not preclude SARS-Cov-2 infection and should not be used as the sole basis for treatment or other patient management decisions. A negative result may occur with  improper specimen collection/handling, submission of specimen other than nasopharyngeal swab, presence of viral mutation(s) within the areas targeted by this assay, and inadequate number of viral copies(<138 copies/mL). A negative result must be combined with clinical observations, patient history, and epidemiological information. The expected result is Negative.  Fact Sheet for Patients:  EntrepreneurPulse.com.au  Fact Sheet for Healthcare Providers:  IncredibleEmployment.be  This test is no t yet approved or cleared by the [place] FDA and  has been authorized for detection and/or diagnosis of SARS-CoV-2 by FDA under an Emergency Use Authorization (EUA). This EUA will remain  in effect (meaning this test can be used) for the duration of the COVID-19 declaration under Section 564(b)(1) of the Act, 21 U.S.C.section 360bbb-3(b)(1), unless the authorization is terminated  or revoked sooner.       Influenza A by PCR NEGATIVE NEGATIVE Final   Influenza B by PCR NEGATIVE NEGATIVE Final    Comment: (NOTE) The Xpert Xpress SARS-CoV-2/FLU/RSV plus assay is intended as an aid in the diagnosis of influenza from Nasopharyngeal swab specimens and should not be  used as a sole basis for treatment. Nasal washings and aspirates are unacceptable for Xpert Xpress SARS-CoV-2/FLU/RSV testing.  Fact Sheet for Patients: EntrepreneurPulse.com.au  Fact Sheet for Healthcare Providers: IncredibleEmployment.be  This test is not yet approved or cleared by the [place] FDA and has been authorized for detection and/or diagnosis of SARS-CoV-2 by FDA under an Emergency Use Authorization (EUA). This EUA will remain in effect (meaning this test can be used) for the duration of the COVID-19 declaration under Section 564(b)(1) of the Act, 21 U.S.C. section 360bbb-3(b)(1), unless the authorization is terminated or revoked.  Performed at [facility], [place] [address], [place], [place] [address]   Surgical pcr screen     Status: Abnormal   Collection Time:  10/26/20  7:06 PM   Specimen: Nasal Mucosa; Nasal Swab  Result Value Ref Range Status   MRSA, PCR POSITIVE (A) NEGATIVE Final    Comment: RESULT CALLED TO, READ BACK BY AND VERIFIED WITH: A. SOUTZ RN, AT [phone] 10/27/20 D. VANHOOK    Staphylococcus aureus POSITIVE (A) NEGATIVE Final    Comment: (NOTE) The Xpert SA Assay (FDA approved for NASAL specimens in patients 43 years of age and older), is one component of a comprehensive surveillance program. It is not intended to diagnose infection nor to guide or monitor treatment. Performed at [facility], [place] [address], [place], [place] [address]   Aerobic/Anaerobic Culture w Gram Stain (surgical/deep wound)     Status: None (Preliminary result)   Collection Time: 10/26/20  9:40 PM   Specimen: Abscess  Result Value Ref Range Status   Specimen Description ABSCESS  Final   Special Requests NONE  Final   Gram Stain   Final    MODERATE WBC PRESENT, PREDOMINANTLY PMN RARE GRAM POSITIVE COCCI IN CLUSTERS Performed at [facility], [place] [address], [place], [place] [address]    Culture PENDING  Incomplete    Report Status PENDING  Incomplete  Aerobic/Anaerobic Culture w Gram Stain (surgical/deep wound)     Status: None (Preliminary result)   Collection Time: 10/26/20  9:44 PM   Specimen: PATH Other; Tissue  Result Value Ref Range Status   Specimen Description ABSCESS  Final   Special Requests LEFT  Final   Gram Stain   Final    ABUNDANT WBC PRESENT, PREDOMINANTLY PMN FEW GRAM POSITIVE COCCI IN CLUSTERS MODERATE GRAM POSITIVE RODS Performed at [facility], [place] [address], [place], [place] [address]    Culture PENDING  Incomplete   Report Status PENDING  Incomplete    Thayer Headings, [facility] for Infectious Disease [facility] [email or web address] 10/27/2020, 11:59 AM

## 2020-10-27 NOTE — Anesthesia Postprocedure Evaluation (Signed)
Anesthesia Post Note  Patient: Kelly [name]  Procedure(s) Performed: IRRIGATION AND DEBRIDEMENT LEFT WRIST (Left: Hand) MINOR IRRIGATION AND DEBRIDEMENT OF LEFT BREAST AND BILATERAL THIGHS (Left: Breast)     Patient location during evaluation: PACU Anesthesia Type: General Level of consciousness: awake and alert Pain management: pain level controlled Vital Signs Assessment: post-procedure vital signs reviewed and stable Respiratory status: spontaneous breathing, nonlabored ventilation, respiratory function stable and patient connected to nasal cannula oxygen Cardiovascular status: blood pressure returned to baseline and stable Postop Assessment: no apparent nausea or vomiting Anesthetic complications: no   No notable events documented.  Last Vitals:  Vitals:   10/27/20 0406 10/27/20 0415  BP:  (!) 144/99  Pulse:  (!) 52  Resp:  16  Temp:  36.7 C  SpO2: 97% 99%    Last Pain:  Vitals:   10/27/20 0415  TempSrc: Oral  PainSc:                  March Rummage Davonda Ausley

## 2020-10-27 NOTE — Progress Notes (Signed)
PROGRESS NOTE        PATIENT DETAILS Name: Kelly [name] Age: 35 y.o. Sex: female Date of Birth: 1985-11-20 Admit Date: 10/26/2020 Admitting Physician Lequita Halt, MD PCP:[facility]  Brief Narrative: Patient is a 35 y.o. 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  She has a history of IV heroin/cocaine use-but claims she has been clean for several months.  UDS negative.  Significant events: 6/15>> admit for left breast/right thigh/left hand abscess.  Significant studies: 5/12>> TTE: EF 42-87%, RV systolic function moderately reduced.  Severe TR. 6/15>> CXR: No pneumonia 6/15>> x-ray left hand: No fracture/dislocation-dorsal soft tissue swelling  Antimicrobial therapy: Vancomycin: 6/15>>  Microbiology data: 6/15>> COVID/influenza PCR: Negative 6/15>> abscess cultures: Gram-positive cocci 6/16>> blood cultures: Ordered  Procedures : 6/15>> I&D of left breast/right thigh/left thigh/left hand abscess by general surgery and orthopedics.  Consults: General surgery, hand surgery  DVT Prophylaxis : SCD's Start: 10/27/20 0030   Subjective: Adamantly denies that she has relapsed and is doing drugs-pain is stable on methadone.  No other issues overnight.   Assessment/Plan: Left breast/right/left thigh and left hand abscess: S/p I&D-continue IV vancomycin-await cultures.  Unfortunately blood cultures were not drawn on admission-we will add on today.  Denies IVDA relapse-urine drug screen negative.  History of recurrent tricuspid valve endocarditis (MSSA 2019, Enterococcus 2020, MRSA 2022): Concerned that she may have underlying endocarditis given her presentation-obtaining blood cultures today-but she already received IV vancomycin.  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 difficile colitis-denies any diarrhea  History of syphilis: Stable for further treatment by ID MD outpatient setting  Recent vaginal birth/delivery: She is not breast-feeding-claims that her 1 month child is  with family members.  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  Diet: Diet Order             Diet regular Room service appropriate? Yes; Fluid consistency: Thin  Diet effective now                    Code Status: Full code   Family Communication: None at bedside  Disposition Plan: Status is: Inpatient  Remains inpatient appropriate because:Inpatient level of care appropriate due to severity of illness  Dispo: The patient is from: Home              Anticipated d/c is to: Home              Patient currently is not medically stable to d/c.   Difficult to place patient No   Barriers to Discharge: Multiple abscesses-needs ongoing IV antibiotics-awaiting culture data.  Antimicrobial agents: Anti-infectives (From admission, onward)    Start     Dose/Rate Route Frequency Ordered Stop   10/27/20 0530  Vancomycin (VANCOCIN) 1,250 mg in sodium chloride 0.9 % 250 mL IVPB        1,250 mg 166.7 mL/hr over 90 Minutes Intravenous Every 12 hours 10/26/20 1932     10/26/20 1730  vancomycin (VANCOREADY) IVPB 1750 mg/350 mL        1,750 mg 175 mL/hr over 120 Minutes Intravenous  Once 10/26/20 1712 10/26/20 1946        Time  spent: 35 minutes-Greater than 50% of this time was spent in counseling, explanation of diagnosis, planning of further management, and coordination of care.  MEDICATIONS: Scheduled Meds:  acidophilus  2 capsule Oral TID   Chlorhexidine Gluconate Cloth  6 each Topical Q0600   furosemide  40 mg Oral Daily   methadone  140 mg Oral Daily   mupirocin ointment  1 application Nasal BID   NIFEdipine  30 mg Oral Daily   prenatal vitamin w/FE, FA  1 tablet Oral Q1200   sertraline  50 mg  Oral Daily   Continuous Infusions:  vancomycin 1,250 mg (10/27/20 0544)   PRN Meds:.acetaminophen, HYDROmorphone (DILAUDID) injection, ketorolac, methocarbamol   PHYSICAL EXAM: Vital signs: Vitals:   10/27/20 0000 10/27/20 0406 10/27/20 0415 10/27/20 0810  BP: (!) 123/93  (!) 144/99 (!) 146/90  Pulse: (!) 59  (!) 52 61  Resp: 12  16 13   Temp: (!) 97.5 F (36.4 C)  98 F (36.7 C) 98.2 F (36.8 C)  TempSrc: Oral  Oral Oral  SpO2: 90% 97% 99% 97%  Weight:      Height:       Filed Weights   10/26/20 1649  Weight: 85.8 kg   Body mass index is 29.19 kg/m.   Gen Exam:Alert awake-not in any distress HEENT:atraumatic, normocephalic Chest: B/L clear to auscultation anteriorly CVS:S1S2 regular Abdomen:soft non tender, non distended Extremities:no edema Neurology: Non focal Skin: no rash  I have personally reviewed following labs and imaging studies  LABORATORY DATA: CBC: Recent Labs  Lab 10/26/20 1756 10/27/20 0043  WBC 10.2 8.8  HGB 8.9* 8.8*  HCT 28.1* 28.2*  MCV 89.5 90.7  PLT 492* 446*    Basic Metabolic Panel: Recent Labs  Lab 10/26/20 1756 10/27/20 0043  NA 138 137  K 3.0* 2.9*  CL 95* 96*  CO2 31 28  GLUCOSE 87 123*  BUN 9 9  CREATININE 0.87 0.94  CALCIUM 8.3* 8.3*    GFR: Estimated Creatinine Clearance: 94 mL/min (by C-G formula based on SCr of 0.94 mg/dL).  Liver Function Tests: Recent Labs  Lab 10/26/20 1756  AST 15  ALT 7  ALKPHOS 95  BILITOT 0.6  PROT 6.1*  ALBUMIN 2.4*   No results for input(s): LIPASE, AMYLASE in the last 168 hours. No results for input(s): AMMONIA in the last 168 hours.  Coagulation Profile: No results for input(s): INR, PROTIME in the last 168 hours.  Cardiac Enzymes: No results for input(s): CKTOTAL, CKMB, CKMBINDEX, TROPONINI in the last 168 hours.  BNP (last 3 results) No results for input(s): PROBNP in the last 8760 hours.  Lipid Profile: No results for input(s): CHOL, HDL, LDLCALC, TRIG,  CHOLHDL, LDLDIRECT in the last 72 hours.  Thyroid Function Tests: No results for input(s): TSH, T4TOTAL, FREET4, T3FREE, THYROIDAB in the last 72 hours.  Anemia Panel: No results for input(s): VITAMINB12, FOLATE, FERRITIN, TIBC, IRON, RETICCTPCT in the last 72 hours.  Urine analysis:    Component Value Date/Time   COLORURINE AMBER (A) 07/13/2020 1320   APPEARANCEUR CLOUDY (A) 07/13/2020 1320   APPEARANCEUR Hazy 07/10/2013 1121   LABSPEC 1.028 07/13/2020 1320   LABSPEC 1.020 07/10/2013 1121   PHURINE 5.0 07/13/2020 1320   GLUCOSEU NEGATIVE 07/13/2020 1320   GLUCOSEU Negative 07/10/2013 1121   HGBUR SMALL (A) 07/13/2020 1320   BILIRUBINUR NEGATIVE 07/13/2020 1320   BILIRUBINUR Negative 07/10/2013 1121   KETONESUR 5 (A) 07/13/2020 1320   PROTEINUR 100 (A) 07/13/2020 1320   UROBILINOGEN 1.0  02/15/2015 0530   NITRITE POSITIVE (A) 07/13/2020 1320   LEUKOCYTESUR LARGE (A) 07/13/2020 1320   LEUKOCYTESUR Trace 07/10/2013 1121    Sepsis Labs: Lactic Acid, Venous    Component Value Date/Time   LATICACIDVEN 1.5 07/14/2020 0021    MICROBIOLOGY: Recent Results (from the past 240 hour(s))  Resp Panel by RT-PCR (Flu A&B, Covid) Nasopharyngeal Swab     Status: None   Collection Time: 10/26/20  7:05 PM   Specimen: Nasopharyngeal Swab; Nasopharyngeal(NP) swabs in vial transport medium  Result Value Ref Range Status   SARS Coronavirus 2 by RT PCR NEGATIVE NEGATIVE Final    Comment: (NOTE) SARS-CoV-2 target nucleic acids are NOT DETECTED.  The SARS-CoV-2 RNA is generally detectable in upper respiratory specimens during the acute phase of infection. The lowest concentration of SARS-CoV-2 viral copies this assay can detect is 138 copies/mL. A negative result does not preclude SARS-Cov-2 infection and should not be used as the sole basis for treatment or other patient management decisions. A negative result may occur with  improper specimen collection/handling, submission of specimen  other than nasopharyngeal swab, presence of viral mutation(s) within the areas targeted by this assay, and inadequate number of viral copies(<138 copies/mL). A negative result must be combined with clinical observations, patient history, and epidemiological information. The expected result is Negative.  Fact Sheet for Patients:  EntrepreneurPulse.com.au  Fact Sheet for Healthcare Providers:  IncredibleEmployment.be  This test is no t yet approved or cleared by the [place] FDA and  has been authorized for detection and/or diagnosis of SARS-CoV-2 by FDA under an Emergency Use Authorization (EUA). This EUA will remain  in effect (meaning this test can be used) for the duration of the COVID-19 declaration under Section 564(b)(1) of the Act, 21 U.S.C.section 360bbb-3(b)(1), unless the authorization is terminated  or revoked sooner.       Influenza A by PCR NEGATIVE NEGATIVE Final   Influenza B by PCR NEGATIVE NEGATIVE Final    Comment: (NOTE) The Xpert Xpress SARS-CoV-2/FLU/RSV plus assay is intended as an aid in the diagnosis of influenza from Nasopharyngeal swab specimens and should not be used as a sole basis for treatment. Nasal washings and aspirates are unacceptable for Xpert Xpress SARS-CoV-2/FLU/RSV testing.  Fact Sheet for Patients: EntrepreneurPulse.com.au  Fact Sheet for Healthcare Providers: IncredibleEmployment.be  This test is not yet approved or cleared by the [place] FDA and has been authorized for detection and/or diagnosis of SARS-CoV-2 by FDA under an Emergency Use Authorization (EUA). This EUA will remain in effect (meaning this test can be used) for the duration of the COVID-19 declaration under Section 564(b)(1) of the Act, 21 U.S.C. section 360bbb-3(b)(1), unless the authorization is terminated or revoked.  Performed at [facility], [place] [address], [place],  [place] [address]   Surgical pcr screen     Status: Abnormal   Collection Time: 10/26/20  7:06 PM   Specimen: Nasal Mucosa; Nasal Swab  Result Value Ref Range Status   MRSA, PCR POSITIVE (A) NEGATIVE Final    Comment: RESULT CALLED TO, READ BACK BY AND VERIFIED WITH: A. SOUTZ RN, AT [phone] 10/27/20 D. VANHOOK    Staphylococcus aureus POSITIVE (A) NEGATIVE Final    Comment: (NOTE) The Xpert SA Assay (FDA approved for NASAL specimens in patients 53 years of age and older), is one component of a comprehensive surveillance program. It is not intended to diagnose infection nor to guide or monitor treatment. Performed at [facility], [place]  [address], [place], [place] [address]   Aerobic/Anaerobic Culture w Gram Stain (surgical/deep wound)     Status: None (Preliminary result)   Collection Time: 10/26/20  9:40 PM   Specimen: Abscess  Result Value Ref Range Status   Specimen Description ABSCESS  Final   Special Requests NONE  Final   Gram Stain   Final    MODERATE WBC PRESENT, PREDOMINANTLY PMN RARE GRAM POSITIVE COCCI IN CLUSTERS Performed at [facility], [place] [address] [place], [place] [address]    Culture PENDING  Incomplete   Report Status PENDING  Incomplete  Aerobic/Anaerobic Culture w Gram Stain (surgical/deep wound)     Status: None (Preliminary result)   Collection Time: 10/26/20  9:44 PM   Specimen: PATH Other; Tissue  Result Value Ref Range Status   Specimen Description ABSCESS  Final   Special Requests LEFT  Final   Gram Stain   Final    ABUNDANT WBC PRESENT, PREDOMINANTLY PMN FEW GRAM POSITIVE COCCI IN CLUSTERS MODERATE GRAM POSITIVE RODS Performed at [facility], [place] [address] [place], [place] [address]    Culture PENDING  Incomplete   Report Status PENDING  Incomplete    RADIOLOGY STUDIES/RESULTS: DG Chest 1 View  Result Date: 10/26/2020 CLINICAL DATA:  Cyst swelling EXAM: CHEST  1 VIEW COMPARISON:  07/15/2020 FINDINGS: Extensive spinal hardware.  Linear atelectasis or scar at the right base. No consolidation, pleural effusion or pneumothorax. Borderline to mild cardiomegaly. IMPRESSION: No active disease. Borderline to mild cardiomegaly with linear atelectasis or scar at the right base Electronically Signed   By: Donavan Foil M.D.   On: 10/26/2020 17:07   DG Hand 2 View Left  Result Date: 10/26/2020 CLINICAL DATA:  Swelling. EXAM: LEFT HAND - 2 VIEW COMPARISON:  None. FINDINGS: There is no evidence of fracture or dislocation. There is no evidence of arthropathy or other focal bone abnormality. Dorsal soft tissue swelling. IMPRESSION: 1. No acute fracture or dislocation identified about the left hand. 2. Dorsal soft tissue swelling. Electronically Signed   By: Fidela Salisbury M.D.   On: 10/26/2020 16:58     LOS: 1 day   Oren Binet, MD  [facility]    To contact the attending provider between 7A-7P or the covering provider during after hours 7P-7A, please log into the web site [email or web address] and access using universal [place] password for that web site. If you do not have the password, please call the hospital operator.  10/27/2020, 12:01 PM

## 2020-10-27 NOTE — TOC Initial Note (Addendum)
Transition of Care [facility]) - Initial/Assessment Note    Patient Details  Name: Kelly [name] MRN: [id number] Date of Birth: [birth year]  Transition [facility] Care [facility]) CM/SW Contact:    Verdell Carmine, RN Phone Number: 10/27/2020, 10:43 AM  Clinical Narrative:                  35 year old with a history of endocarditis, and IV drug use, now on methadone, admitted with multiple abscesses. I&D done in surgery early this AM of all areas Will need to be on antibiotics, length undetermined Will see what programs are available given the questionable recent IV use, if she is to stay on antibiotics  after DC CM will follow for needs  Expected Discharge Plan: Home/Self Care Barriers to Discharge:  (IV use precludes pt from getting IV antibiotics at home)   Patient Goals and CMS Choice        Expected Discharge Plan and Services Expected Discharge Plan: Home/Self Care   Discharge Planning Services: CM Consult   Living arrangements for the past 2 months: Single Family Home                                      Prior Living Arrangements/Services Living arrangements for the past 2 months: Single Family Home Lives with:: Significant Other Patient language and need for interpreter reviewed:: Yes        Need for Family Participation in Patient Care: Yes (Comment) Care giver support system in place?: Yes (comment)   Criminal Activity/Legal Involvement Pertinent to Current Situation/Hospitalization: No - Comment as needed  Activities of Daily Living Home Assistive Devices/Equipment: None ADL Screening (condition at time of admission) Patient's cognitive ability adequate to safely complete daily activities?: Yes Is the patient deaf or have difficulty hearing?: No Does the patient have difficulty seeing, even when wearing glasses/contacts?: No Does the patient have difficulty concentrating, remembering, or making decisions?: No Patient able to express need for assistance with  ADLs?: Yes Does the patient have difficulty dressing or bathing?: No Independently performs ADLs?: Yes (appropriate for developmental age) Does the patient have difficulty walking or climbing stairs?: No Weakness of Legs: None Weakness of Arms/Hands: None  Permission Sought/Granted                  Emotional Assessment       Orientation: : Oriented to Self, Oriented to Place, Oriented to  Time, Oriented to Situation Alcohol / Substance Use: Illicit Drugs Psych Involvement: No (comment)  Admission diagnosis:  Leg abscess [L02.419] Patient Active Problem List   Diagnosis Date Noted   Thigh abscess 10/26/2020   Infection of left wrist ([place]) 10/26/2020   Vaginal discharge 10/26/2020   Abscess 10/26/2020   Leg abscess 10/26/2020   VBAC (vaginal birth after Cesarean) 09/30/2020   Chronic hypertension affecting pregnancy 09/29/2020   [redacted] weeks gestation of pregnancy 09/26/2020   Syphilis affecting pregnancy in third trimester 09/23/2020   C. difficile diarrhea 09/14/2020   Septic pulmonary embolism ([place]) 08/24/2020   Severe sepsis ([place]) 08/24/2020   Acute on chronic systolic CHF (congestive heart failure) ([place]) 08/24/2020   Severe pulmonary hypertension ([place]) 08/24/2020   Endocarditis of tricuspid valve 08/24/2020   Tricuspid valve regurgitation 08/24/2020   Prolonged QT interval 08/24/2020   Pain, dental    Maternal endocarditis complicating pregnancy in third trimester    Methadone maintenance treatment affecting pregnancy ([place])  07/21/2020   Drug use affecting pregnancy in second trimester    Septic embolism ([facility])    Syphilis    Cellulitis of right lower extremity    MRSA bacteremia 07/13/2020   Acute on chronic congestive heart failure ([place]) 07/13/2020   MDD (major depressive disorder), recurrent severe, 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   Oth psychoactive substance abuse w mood disorder ([place]) 06/26/2019   Congenital heart defect 10/07/2018   History of hepatitis C  10/01/2018   Unwanted fertility 77/93/9030   Chronic systolic heart failure ([place]) 08/29/2018   Infection due to Streptococcus mitis group 07/20/2018   History of VBAC 07/16/2018   Endocarditis 07/12/2018   Dental caries 07/03/2018   Phobia of dental procedure 07/03/2018   Preexisting hypertension complicating pregnancy, antepartum 06/29/2018   Supervision of other high risk pregnancies, third trimester 06/27/2018   Rubella non-immune status, antepartum 06/23/2018   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) 06/14/2018   Tricuspid regurgitation 06/14/2018   Difficult intravenous access    IVDU (intravenous drug user)    Bipolar disorder ([place]) 07/31/2016   Major depressive disorder, recurrent episode with anxious distress ([place]) 07/29/2016   Polysubstance abuse ([place]) 05/25/2016   No prenatal care in current pregnancy in third trimester 05/25/2016   Chronic hypertension during pregnancy, antepartum 05/25/2016   History of cesarean delivery affecting pregnancy 05/25/2016   Breast abscess 07/13/2014   Opioid use disorder, severe, dependence ([place]) 08/25/2013   Unspecified episodic mood disorder 08/25/2013   ADHD (attention deficit hyperactivity disorder) 08/25/2013   Cocaine abuse ([place]) 08/25/2013   Thoracic back pain 01/29/2013   Scoliosis 11/17/2012   CROHN'S DISEASE 05/03/2005   COLONIC POLYPS 02/28/2003   PCP:  [facility] Pharm[facility]:   [facility] [place], [place] [address] [place] [address] Phone: [phone] Fax: [phone]     Social Determinants of Health (SDOH) Interventions    Readmission Risk Interventions Readmission Risk Prevention Plan 08/25/2020 07/30/2018  Transportation Screening Complete Complete  Transportation Screening Comment Rooms at the [facility] to provide transportation  for the patient. -  PCP or Specialist Appt within 5-7 Days Complete -  Not Complete comments 08/29/20 OB/GYN appointment scheduled -  Home Care Screening Complete -  Medication Review (RN Care Manager) - Complete  PCP or Specialist appointment within 3-5 days of discharge - Complete  HRI or [place] - Not Complete  HRI or Home Care Consult Pt Refusal Comments - N/A  SW Recovery Care/Counseling Consult - Complete  Palliative Care Screening - Not Complete  Comments - N/A  Skilled Nursing Facility - Not Complete  SNF Comments - N/A  Some recent data might be hidden

## 2020-10-27 NOTE — Progress Notes (Signed)
Progress Note  1 Day Post-Op  Subjective: CC: having pain. Greatest in hand and left breast. She is getting multimodal pain control and received IV dilaudid during my visit. Eating and drinking without N/V   Objective: Vital signs in last 24 hours: Temp:  [97.5 F (36.4 C)-98.4 F (36.9 C)] 98.2 F (36.8 C) (06/16 0810) Pulse Rate:  [52-73] 61 (06/16 0810) Resp:  [10-20] 13 (06/16 0810) BP: (120-149)/(85-104) 146/90 (06/16 0810) SpO2:  [90 %-100 %] 97 % (06/16 0810) Weight:  [85.8 kg] 85.8 kg (06/15 1649) Last BM Date: 10/26/20  Intake/Output from previous day: 06/15 0701 - 06/16 0700 In: 1400 [P.O.:600; I.V.:800] Out: 25 [Blood:25] Intake/Output this shift: No intake/output data recorded.  PE: General: pleasant, WD, female who is laying in bed in NAD HEENT: head is normocephalic, atraumatic. Mouth is pink and moist Heart: regular, rate, and rhythm. Palpable radial and pedal pulses bilaterally Lungs: Respiratory effort nonlabored on room air Abd: soft, NT, ND MS: all 4 extremities are symmetrical with no cyanosis, clubbing, or edema. Skin: warm and dry. Left breast abscess with erythema and induration around open incision - no necrosis or purulent drainage. Bilateral upper thigh abscesses without purulent drainage or necrosis, there is minimal surrounding induration. Psych: A&Ox3, tearful due to pain     Left thigh  Right thigh     Lab Results:  Recent Labs    10/26/20 1756 10/27/20 0043  WBC 10.2 8.8  HGB 8.9* 8.8*  HCT 28.1* 28.2*  PLT 492* 446*   BMET Recent Labs    10/26/20 1756 10/27/20 0043  NA 138 137  K 3.0* 2.9*  CL 95* 96*  CO2 31 28  GLUCOSE 87 123*  BUN 9 9  CREATININE 0.87 0.94  CALCIUM 8.3* 8.3*   PT/INR No results for input(s): LABPROT, INR in the last 72 hours. CMP     Component Value Date/Time   NA 137 10/27/2020 0043   NA 136 07/02/2013 0547   K 2.9 (L) 10/27/2020 0043   K 3.7 07/02/2013 0547   CL 96 (L) 10/27/2020  0043   CL 102 07/02/2013 0547   CO2 28 10/27/2020 0043   CO2 30 07/02/2013 0547   GLUCOSE 123 (H) 10/27/2020 0043   GLUCOSE 86 07/28/2020 0626   BUN 9 10/27/2020 0043   BUN 7 07/02/2013 0547   CREATININE 0.94 10/27/2020 0043   CREATININE 0.82 07/02/2013 0547   CALCIUM 8.3 (L) 10/27/2020 0043   CALCIUM 8.8 07/02/2013 0547   PROT 6.1 (L) 10/26/2020 1756   PROT 7.7 07/10/2013 1121   ALBUMIN 2.4 (L) 10/26/2020 1756   ALBUMIN 3.4 07/10/2013 1121   AST 15 10/26/2020 1756   AST 115 (H) 07/10/2013 1121   ALT 7 10/26/2020 1756   ALT 95 (H) 07/10/2013 1121   ALKPHOS 95 10/26/2020 1756   ALKPHOS 120 (H) 07/10/2013 1121   BILITOT 0.6 10/26/2020 1756   BILITOT 1.9 (H) 07/10/2013 1121   GFRNONAA >60 10/27/2020 0043   GFRNONAA >60 07/02/2013 0547   GFRAA >60 06/26/2019 1812   GFRAA >60 07/02/2013 0547   Lipase     Component Value Date/Time   LIPASE 19 07/13/2020 1035   LIPASE 80 06/29/2013 0454       Studies/Results: DG Chest 1 View  Result Date: 10/26/2020 CLINICAL DATA:  Cyst swelling EXAM: CHEST  1 VIEW COMPARISON:  07/15/2020 FINDINGS: Extensive spinal hardware. Linear atelectasis or scar at the right base. No consolidation, pleural effusion or pneumothorax. Borderline to  mild cardiomegaly. IMPRESSION: No active disease. Borderline to mild cardiomegaly with linear atelectasis or scar at the right base Electronically Signed   By: Donavan Foil M.D.   On: 10/26/2020 17:07   DG Hand 2 View Left  Result Date: 10/26/2020 CLINICAL DATA:  Swelling. EXAM: LEFT HAND - 2 VIEW COMPARISON:  None. FINDINGS: There is no evidence of fracture or dislocation. There is no evidence of arthropathy or other focal bone abnormality. Dorsal soft tissue swelling. IMPRESSION: 1. No acute fracture or dislocation identified about the left hand. 2. Dorsal soft tissue swelling. Electronically Signed   By: Fidela Salisbury M.D.   On: 10/26/2020 16:58    Anti-infectives: Anti-infectives (From admission,  onward)    Start     Dose/Rate Route Frequency Ordered Stop   10/27/20 0530  Vancomycin (VANCOCIN) 1,250 mg in sodium chloride 0.9 % 250 mL IVPB        1,250 mg 166.7 mL/hr over 90 Minutes Intravenous Every 12 hours 10/26/20 1932     10/26/20 1730  vancomycin (VANCOREADY) IVPB 1750 mg/350 mL        1,750 mg 175 mL/hr over 120 Minutes Intravenous  Once 10/26/20 1712 10/26/20 1946        Assessment/Plan Multiple soft tissue abscesses - POD1 I&D bilateral upper thigh abscesses, L breast abscess, CW 6/15 - BID dressing changes. Wet to dry on left breast with minimal superficial packing. Dry gauze on b/l thighs - L hand abscess - s/p I&D by orthopedics - per ortho - multimodal pain control in addition to methadone. Added robaxin  - continue IV Abx. Follow cultures  FEN: regular ID: vancomycin 6/15 >> VTE: SCDs, okay for chemical prophylaxis from our standpoint  Per medicine IVDA Endocarditis Heart failure HTN HCV syphilis    LOS: 1 day    Winferd Humphrey, [facility] 10/27/2020, 10:50 AM Please see Amion for pager number during day hours 7:00am-4:30pm

## 2020-10-28 ENCOUNTER — Encounter (HOSPITAL_COMMUNITY): Payer: Self-pay | Admitting: Internal Medicine

## 2020-10-28 ENCOUNTER — Inpatient Hospital Stay (HOSPITAL_COMMUNITY): Payer: Medicaid Other

## 2020-10-28 LAB — COMPREHENSIVE METABOLIC PANEL
ALT: 7 U/L (ref 0–44)
AST: 11 U/L — ABNORMAL LOW (ref 15–41)
Albumin: 2.3 g/dL — ABNORMAL LOW (ref 3.5–5.0)
Alkaline Phosphatase: 100 U/L (ref 38–126)
Anion gap: 8 (ref 5–15)
BUN: 14 mg/dL (ref 6–20)
CO2: 31 mmol/L (ref 22–32)
Calcium: 8.5 mg/dL — ABNORMAL LOW (ref 8.9–10.3)
Chloride: 100 mmol/L (ref 98–111)
Creatinine, Ser: 0.97 mg/dL (ref 0.44–1.00)
GFR, Estimated: >60 mL/min (ref >60)
Glucose, Bld: 90 mg/dL (ref 70–99)
Potassium: 3.9 mmol/L (ref 3.5–5.1)
Sodium: 139 mmol/L (ref 135–145)
Total Bilirubin: 0.6 mg/dL (ref 0.3–1.2)
Total Protein: 5.9 g/dL — ABNORMAL LOW (ref 6.5–8.1)

## 2020-10-28 LAB — CBC
HCT: 27.7 % — ABNORMAL LOW (ref 36.0–46.0)
Hemoglobin: 8.5 g/dL — ABNORMAL LOW (ref 12.0–15.0)
MCH: 28.1 pg (ref 26.0–34.0)
MCHC: 30.7 g/dL (ref 30.0–36.0)
MCV: 91.4 fL (ref 80.0–100.0)
Platelets: 511 10*3/uL — ABNORMAL HIGH (ref 150–400)
RBC: 3.03 MIL/uL — ABNORMAL LOW (ref 3.87–5.11)
RDW: 14.9 % (ref 11.5–15.5)
WBC: 8.9 10*3/uL (ref 4.0–10.5)
nRBC: 0 % (ref 0.0–0.2)

## 2020-10-28 LAB — MAGNESIUM: Magnesium: 1.7 mg/dL (ref 1.7–2.4)

## 2020-10-28 MED ORDER — ENOXAPARIN SODIUM 40 MG/0.4ML IJ SOSY
40.0000 mg | PREFILLED_SYRINGE | INTRAMUSCULAR | Status: DC
  Administered 2020-10-28 – 2020-11-04 (×8): 40 mg via SUBCUTANEOUS
  Filled 2020-10-28 (×8): qty 0.4

## 2020-10-28 MED ORDER — MAGNESIUM SULFATE 2 GM/50ML IV SOLN
2.0000 g | Freq: Once | INTRAVENOUS | Status: AC
  Administered 2020-10-28: 2 g via INTRAVENOUS
  Filled 2020-10-28: qty 50

## 2020-10-28 NOTE — TOC Progression Note (Signed)
Transition of Care [facility]) - Progression Note    Patient Details  Name: Kelly [name] MRN: [id number] Date of Birth: May 01, 1986  Transition of Care [facility]) CM/SW [place], RN Phone Number: 10/28/2020, 10:45 AM  Clinical Narrative:     Patient asked to speak to care Manager. She is very concerned as she has no place to go after discharge.  She was staying at [facility], but she states she cannot go back there. "It was not a rehabilitation place, that was not the priority there" She needs a place to go that she can receive methadone, or go to the methadone clinic. , her phone is off, she has no money. She requested Cortlin, the CSW from HF team to talk to her, as she knows her nad she fixed her situations previously. I stated I would speak to her about what was done previously  Expected Discharge Plan: Home/Self Care Barriers to Discharge:  (IV use precludes pt from getting IV antibiotics at home)  Expected Discharge Plan and Services Expected Discharge Plan: Home/Self Care   Discharge Planning Services: CM Consult   Living arrangements for the past 2 months: Single Family Home                                       Social Determinants of Health (SDOH) Interventions    Readmission Risk Interventions Readmission Risk Prevention Plan 08/25/2020 07/30/2018  Transportation Screening Complete Complete  Transportation Screening Comment Rooms at the [facility] to provide transportation for the patient. -  PCP or Specialist Appt within 5-7 Days Complete -  Not Complete comments 08/29/20 OB/GYN appointment scheduled -  Home Care Screening Complete -  Medication Review (RN Care Manager) - Complete  PCP or Specialist appointment within 3-5 days of discharge - Complete  HRI or [place] - Not Complete  HRI or Home Care Consult Pt Refusal Comments - N/A  SW Recovery Care/Counseling Consult - Complete  Palliative Care Screening - Not Complete  Comments - N/A   Skilled Nursing Facility - Not Complete  SNF Comments - N/A  Some recent data might be hidden

## 2020-10-28 NOTE — Progress Notes (Signed)
Patient ID: Kelly [name], female   DOB: 1986-04-18, 35 y.o.   MRN: [id number] 2 Days Post-Op   Subjective: Pain at wounds ROS negative except as listed above. Objective: Vital signs in last 24 hours: Temp:  [98.4 F (36.9 C)-98.7 F (37.1 C)] 98.4 F (36.9 C) (06/16 2019) Pulse Rate:  [58-63] 63 (06/16 2019) Resp:  [11-17] 11 (06/16 2019) BP: (115-136)/(82-105) 115/82 (06/16 2019) SpO2:  [93 %-98 %] 93 % (06/16 2019) Last BM Date: 10/27/20  Intake/Output from previous day: 06/16 0701 - 06/17 0700 In: 970 [P.O.:720; IV Piggyback:250] Out: -  Intake/Output this shift: No intake/output data recorded.  General appearance: alert Breasts: L brease wund stable Extremities: B thigh wounds stable  Lab Results: CBC  Recent Labs    10/27/20 0043 10/28/20 0038  WBC 8.8 8.9  HGB 8.8* 8.5*  HCT 28.2* 27.7*  PLT 446* 511*   BMET Recent Labs    10/27/20 0043 10/28/20 0038  NA 137 139  K 2.9* 3.9  CL 96* 100  CO2 28 31  GLUCOSE 123* 90  BUN 9 14  CREATININE 0.94 0.97  CALCIUM 8.3* 8.5*   PT/INR No results for input(s): LABPROT, INR in the last 72 hours. ABG No results for input(s): PHART, HCO3 in the last 72 hours.  Invalid input(s): PCO2, PO2  Studies/Results: DG Chest 1 View  Result Date: 10/26/2020 CLINICAL DATA:  Cyst swelling EXAM: CHEST  1 VIEW COMPARISON:  07/15/2020 FINDINGS: Extensive spinal hardware. Linear atelectasis or scar at the right base. No consolidation, pleural effusion or pneumothorax. Borderline to mild cardiomegaly. IMPRESSION: No active disease. Borderline to mild cardiomegaly with linear atelectasis or scar at the right base Electronically Signed   By: Donavan Foil M.D.   On: 10/26/2020 17:07   DG Hand 2 View Left  Result Date: 10/26/2020 CLINICAL DATA:  Swelling. EXAM: LEFT HAND - 2 VIEW COMPARISON:  None. FINDINGS: There is no evidence of fracture or dislocation. There is no evidence of arthropathy or other focal bone abnormality.  Dorsal soft tissue swelling. IMPRESSION: 1. No acute fracture or dislocation identified about the left hand. 2. Dorsal soft tissue swelling. Electronically Signed   By: Fidela Salisbury M.D.   On: 10/26/2020 16:58    Anti-infectives: Anti-infectives (From admission, onward)    Start     Dose/Rate Route Frequency Ordered Stop   10/27/20 0530  Vancomycin (VANCOCIN) 1,250 mg in sodium chloride 0.9 % 250 mL IVPB        1,250 mg 166.7 mL/hr over 90 Minutes Intravenous Every 12 hours 10/26/20 1932     10/26/20 1730  vancomycin (VANCOREADY) IVPB 1750 mg/350 mL        1,750 mg 175 mL/hr over 120 Minutes Intravenous  Once 10/26/20 1712 10/26/20 1946       Assessment/Plan: Multiple soft tissue abscesses - S/P I&D bilateral upper thigh abscesses, L breast abscess, CW 6/15 - BID dressing changes. Wet to dry on left breast with minimal superficial packing. Dry gauze on b/l thighs - L hand abscess - s/p I&D by Dr. Apolonio Schneiders - per Hand Surgery - multimodal pain control in addition to methadone.   - continue IV Abx. Follow cultures  FEN: regular ID: vancomycin 6/15 >> VTE: SCDs, okay for chemical prophylaxis from our standpoint  Per medicine IVDA Endocarditis Heart failure HTN HCV syphilis  LOS: 2 days    Georganna Skeans, MD, MPH, FACS Trauma & General Surgery Use [email or web address] to contact on call provider  10/28/2020

## 2020-10-28 NOTE — Progress Notes (Signed)
PROGRESS NOTE        PATIENT DETAILS Name: Kelly [name] Age: 35 y.o. Sex: female Date of Birth: 01/04/1986 Admit Date: 10/26/2020 Admitting Physician Lequita Halt, MD PCP:[facility], [facility]  Brief Narrative: Patient is a 35 y.o. 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  She has a history of IV heroin/cocaine use-but claims she has been clean for several months.  UDS negative.  Significant events: 6/15>> admit for left breast/right thigh/left hand abscess.  Significant studies: 5/12>> TTE: EF 84-16%, RV systolic function moderately reduced.  Severe TR. 6/15>> CXR: No pneumonia 6/15>> x-ray left hand: No fracture/dislocation-dorsal soft tissue swelling  Antimicrobial therapy: Vancomycin: 6/15>>  Microbiology data: 6/15>> COVID/influenza PCR: Negative 6/15>> abscess cultures: Staff aureus (sensitivities pending) 6/16>> blood cultures: No growth  Procedures : 6/15>> I&D of left breast/right thigh/left thigh/left hand abscess by general surgery and orthopedics.  Consults: General surgery, hand surgery  DVT Prophylaxis : enoxaparin (LOVENOX) injection 40 mg Start: 10/28/20 1630 SCD's Start: 10/27/20 0030   Subjective: No major issues overnight-lying comfortably in bed-pain reasonably well controlled with methadone per patient.   Assessment/Plan: Left breast/right/left thigh and left hand abscess: S/p I&D by general surgery/hand surgery-wound cultures positive for staph aureus-remains on IV vancomycin.  ID also following.  Unfortunately blood cultures were not drawn on admission-they were done the next day-while patient was already on IV antibiotics-negative so far.   History of recurrent tricuspid valve endocarditis (MSSA 2019, Enterococcus 2020, MRSA 2022): Concerned that she may have underlying  endocarditis given her presentation-unfortunately blood cultures were done after patient received antibiotics-on IV vancomycin-we will await further recommendations from infectious disease.    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 difficile colitis-denies any diarrhea  History of syphilis: Stable for further treatment by ID MD outpatient setting  Recent vaginal birth/delivery: She is not breast-feeding-claims that her 1 month child is  with family members.  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  Diet: Diet Order             Diet regular Room service appropriate? Yes; Fluid consistency: Thin  Diet effective now                    Code Status: Full code   Family Communication: None at bedside  Disposition Plan: Status is: Inpatient  Remains inpatient appropriate because:Inpatient level of care appropriate due to severity of illness  Dispo: The patient is from: Home              Anticipated d/c is to: Home              Patient currently is not medically stable to d/c.   Difficult to place patient No   Barriers to Discharge: Multiple abscesses-needs ongoing IV antibiotics-awaiting culture data.  Antimicrobial agents: Anti-infectives (From admission, onward)    Start     Dose/Rate Route Frequency Ordered Stop   10/27/20 0530  Vancomycin (VANCOCIN) 1,250 mg in sodium chloride 0.9 % 250 mL IVPB        1,250 mg 166.7 mL/hr over 90 Minutes Intravenous Every 12 hours 10/26/20 1932     10/26/20  1730  vancomycin (VANCOREADY) IVPB 1750 mg/350 mL        1,750 mg 175 mL/hr over 120 Minutes Intravenous  Once 10/26/20 1712 10/26/20 1946        Time spent: 25 minutes-Greater than 50% of this time was spent in counseling, explanation of diagnosis, planning of further management, and coordination of care.  MEDICATIONS: Scheduled Meds:  acidophilus  2  capsule Oral TID   Chlorhexidine Gluconate Cloth  6 each Topical Q0600   enoxaparin (LOVENOX) injection  40 mg Subcutaneous Q24H   furosemide  40 mg Oral Daily   methadone  130 mg Oral Daily   mupirocin ointment  1 application Nasal BID   NIFEdipine  30 mg Oral Daily   prenatal vitamin w/FE, FA  1 tablet Oral Q1200   sertraline  50 mg Oral Daily   Continuous Infusions:  vancomycin 1,250 mg (10/28/20 0541)   PRN Meds:.acetaminophen, HYDROmorphone (DILAUDID) injection, ketorolac, methocarbamol   PHYSICAL EXAM: Vital signs: Vitals:   10/27/20 0810 10/27/20 1204 10/27/20 1631 10/27/20 2019  BP: (!) 146/90 (!) 136/105 (!) 127/101 115/82  Pulse: 61 63 (!) 58 63  Resp: 13 16 17 11   Temp: 98.2 F (36.8 C) 98.4 F (36.9 C) 98.7 F (37.1 C) 98.4 F (36.9 C)  TempSrc: Oral Oral Oral Oral  SpO2: 97% 97% 98% 93%  Weight:      Height:       Filed Weights   10/26/20 1649  Weight: 85.8 kg   Body mass index is 29.19 kg/m.   Gen Exam:Alert awake-not in any distress HEENT:atraumatic, normocephalic Chest: B/L clear to auscultation anteriorly CVS:S1S2 regular Abdomen:soft non tender, non distended Extremities:no edema Neurology: Non focal Skin: no rash   I have personally reviewed following labs and imaging studies  LABORATORY DATA: CBC: Recent Labs  Lab 10/26/20 1756 10/27/20 0043 10/28/20 0038  WBC 10.2 8.8 8.9  HGB 8.9* 8.8* 8.5*  HCT 28.1* 28.2* 27.7*  MCV 89.5 90.7 91.4  PLT 492* 446* 511*    Basic Metabolic Panel: Recent Labs  Lab 10/26/20 1756 10/27/20 0043 10/28/20 0038  NA 138 137 139  K 3.0* 2.9* 3.9  CL 95* 96* 100  CO2 31 28 31   GLUCOSE 87 123* 90  BUN 9 9 14   CREATININE 0.87 0.94 0.97  CALCIUM 8.3* 8.3* 8.5*  MG  --   --  1.7    GFR: Estimated Creatinine Clearance: 91.1 mL/min (by C-G formula based on SCr of 0.97 mg/dL).  Liver Function Tests: Recent Labs  Lab 10/26/20 1756 10/28/20 0038  AST 15 11*  ALT 7 7  ALKPHOS 95 100  BILITOT  0.6 0.6  PROT 6.1* 5.9*  ALBUMIN 2.4* 2.3*   No results for input(s): LIPASE, AMYLASE in the last 168 hours. No results for input(s): AMMONIA in the last 168 hours.  Coagulation Profile: No results for input(s): INR, PROTIME in the last 168 hours.  Cardiac Enzymes: No results for input(s): CKTOTAL, CKMB, CKMBINDEX, TROPONINI in the last 168 hours.  BNP (last 3 results) No results for input(s): PROBNP in the last 8760 hours.  Lipid Profile: No results for input(s): CHOL, HDL, LDLCALC, TRIG, CHOLHDL, LDLDIRECT in the last 72 hours.  Thyroid Function Tests: No results for input(s): TSH, T4TOTAL, FREET4, T3FREE, THYROIDAB in the last 72 hours.  Anemia Panel: No results for input(s): VITAMINB12, FOLATE, FERRITIN, TIBC, IRON, RETICCTPCT in the last 72 hours.  Urine analysis:    Component Value Date/Time   COLORURINE AMBER (  A) 07/13/2020 1320   APPEARANCEUR CLOUDY (A) 07/13/2020 1320   APPEARANCEUR Hazy 07/10/2013 1121   LABSPEC 1.028 07/13/2020 1320   LABSPEC 1.020 07/10/2013 1121   PHURINE 5.0 07/13/2020 1320   GLUCOSEU NEGATIVE 07/13/2020 1320   GLUCOSEU Negative 07/10/2013 1121   HGBUR SMALL (A) 07/13/2020 1320   BILIRUBINUR NEGATIVE 07/13/2020 1320   BILIRUBINUR Negative 07/10/2013 1121   KETONESUR 5 (A) 07/13/2020 1320   PROTEINUR 100 (A) 07/13/2020 1320   UROBILINOGEN 1.0 02/15/2015 0530   NITRITE POSITIVE (A) 07/13/2020 1320   LEUKOCYTESUR LARGE (A) 07/13/2020 1320   LEUKOCYTESUR Trace 07/10/2013 1121    Sepsis Labs: Lactic Acid, Venous    Component Value Date/Time   LATICACIDVEN 1.5 07/14/2020 0021    MICROBIOLOGY: Recent Results (from the past 240 hour(s))  Resp Panel by RT-PCR (Flu A&B, Covid) Nasopharyngeal Swab     Status: None   Collection Time: 10/26/20  7:05 PM   Specimen: Nasopharyngeal Swab; Nasopharyngeal(NP) swabs in vial transport medium  Result Value Ref Range Status   SARS Coronavirus 2 by RT PCR NEGATIVE NEGATIVE Final    Comment:  (NOTE) SARS-CoV-2 target nucleic acids are NOT DETECTED.  The SARS-CoV-2 RNA is generally detectable in upper respiratory specimens during the acute phase of infection. The lowest concentration of SARS-CoV-2 viral copies this assay can detect is 138 copies/mL. A negative result does not preclude SARS-Cov-2 infection and should not be used as the sole basis for treatment or other patient management decisions. A negative result may occur with  improper specimen collection/handling, submission of specimen other than nasopharyngeal swab, presence of viral mutation(s) within the areas targeted by this assay, and inadequate number of viral copies(<138 copies/mL). A negative result must be combined with clinical observations, patient history, and epidemiological information. The expected result is Negative.  Fact Sheet for Patients:  EntrepreneurPulse.com.au  Fact Sheet for Healthcare Providers:  IncredibleEmployment.be  This test is no t yet approved or cleared by the [place] FDA and  has been authorized for detection and/or diagnosis of SARS-CoV-2 by FDA under an Emergency Use Authorization (EUA). This EUA will remain  in effect (meaning this test can be used) for the duration of the COVID-19 declaration under Section 564(b)(1) of the Act, 21 U.S.C.section 360bbb-3(b)(1), unless the authorization is terminated  or revoked sooner.       Influenza A by PCR NEGATIVE NEGATIVE Final   Influenza B by PCR NEGATIVE NEGATIVE Final    Comment: (NOTE) The Xpert Xpress SARS-CoV-2/FLU/RSV plus assay is intended as an aid in the diagnosis of influenza from Nasopharyngeal swab specimens and should not be used as a sole basis for treatment. Nasal washings and aspirates are unacceptable for Xpert Xpress SARS-CoV-2/FLU/RSV testing.  Fact Sheet for Patients: EntrepreneurPulse.com.au  Fact Sheet for Healthcare  Providers: IncredibleEmployment.be  This test is not yet approved or cleared by the [place] FDA and has been authorized for detection and/or diagnosis of SARS-CoV-2 by FDA under an Emergency Use Authorization (EUA). This EUA will remain in effect (meaning this test can be used) for the duration of the COVID-19 declaration under Section 564(b)(1) of the Act, 21 U.S.C. section 360bbb-3(b)(1), unless the authorization is terminated or revoked.  Performed at [facility], [place] [address], [place], [place] [address]   Surgical pcr screen     Status: Abnormal   Collection Time: 10/26/20  7:06 PM   Specimen: Nasal Mucosa; Nasal Swab  Result Value Ref Range Status   MRSA, PCR POSITIVE (A)  NEGATIVE Final    Comment: RESULT CALLED TO, READ BACK BY AND VERIFIED WITH: A. SOUTZ RN, AT [phone] 10/27/20 D. VANHOOK    Staphylococcus aureus POSITIVE (A) NEGATIVE Final    Comment: (NOTE) The Xpert SA Assay (FDA approved for NASAL specimens in patients 52 years of age and older), is one component of a comprehensive surveillance program. It is not intended to diagnose infection nor to guide or monitor treatment. Performed at [facility], [place] [address] [place], [place] [address]   Aerobic/Anaerobic Culture w Gram Stain (surgical/deep wound)     Status: None (Preliminary result)   Collection Time: 10/26/20  9:40 PM   Specimen: Abscess  Result Value Ref Range Status   Specimen Description ABSCESS  Final   Special Requests NONE  Final   Gram Stain   Final    MODERATE WBC PRESENT, PREDOMINANTLY PMN RARE GRAM POSITIVE COCCI IN CLUSTERS    Culture   Final    RARE STAPHYLOCOCCUS AUREUS SUSCEPTIBILITIES TO FOLLOW Performed at [facility], [place] [address] [place], [place] [address]    Report Status PENDING  Incomplete  Aerobic/Anaerobic Culture w Gram Stain (surgical/deep wound)     Status: None (Preliminary result)   Collection Time: 10/26/20  9:44 PM    Specimen: PATH Other; Tissue  Result Value Ref Range Status   Specimen Description ABSCESS  Final   Special Requests LEFT  Final   Gram Stain   Final    ABUNDANT WBC PRESENT, PREDOMINANTLY PMN FEW GRAM POSITIVE COCCI IN CLUSTERS MODERATE GRAM POSITIVE RODS    Culture   Final    MODERATE STAPHYLOCOCCUS AUREUS SUSCEPTIBILITIES TO FOLLOW Performed at [facility], [place] [address] [place], [place] [address]    Report Status PENDING  Incomplete  Culture, blood (routine x 2)     Status: None (Preliminary result)   Collection Time: 10/27/20  1:46 PM   Specimen: BLOOD  Result Value Ref Range Status   Specimen Description BLOOD RIGHT ANTECUBITAL  Final   Special Requests   Final    BOTTLES DRAWN AEROBIC AND ANAEROBIC Blood Culture adequate volume   Culture   Final    NO GROWTH < 24 HOURS Performed at [facility], [place] [address] [place], [place] [address]    Report Status PENDING  Incomplete  Culture, blood (routine x 2)     Status: None (Preliminary result)   Collection Time: 10/27/20  1:54 PM   Specimen: BLOOD RIGHT FOREARM  Result Value Ref Range Status   Specimen Description BLOOD RIGHT FOREARM  Final   Special Requests   Final    BOTTLES DRAWN AEROBIC ONLY Blood Culture adequate volume   Culture   Final    NO GROWTH < 24 HOURS Performed at [facility], [place] [address], [place], [place] [address]    Report Status PENDING  Incomplete    RADIOLOGY STUDIES/RESULTS: DG Chest 1 View  Result Date: 10/26/2020 CLINICAL DATA:  Cyst swelling EXAM: CHEST  1 VIEW COMPARISON:  07/15/2020 FINDINGS: Extensive spinal hardware. Linear atelectasis or scar at the right base. No consolidation, pleural effusion or pneumothorax. Borderline to mild cardiomegaly. IMPRESSION: No active disease. Borderline to mild cardiomegaly with linear atelectasis or scar at the right base Electronically Signed   By: Donavan Foil M.D.   On: 10/26/2020 17:07   DG Hand 2 View Left  Result Date:  10/26/2020 CLINICAL DATA:  Swelling. EXAM: LEFT HAND - 2 VIEW  COMPARISON:  None. FINDINGS: There is no evidence of fracture or dislocation. There is no evidence of arthropathy or other focal bone abnormality. Dorsal soft tissue swelling. IMPRESSION: 1. No acute fracture or dislocation identified about the left hand. 2. Dorsal soft tissue swelling. Electronically Signed   By: Fidela Salisbury M.D.   On: 10/26/2020 16:58     LOS: 2 days   Oren Binet, MD  [facility]    To contact the attending provider between 7A-7P or the covering provider during after hours 7P-7A, please log into the web site [email or web address] and access using universal [place] password for that web site. If you do not have the password, please call the hospital operator.  10/28/2020, 3:44 PM

## 2020-10-28 NOTE — Progress Notes (Signed)
[facility]ious [facility]   Reason for visit: Follow up on abscesses  Interval History: no acute events. WBC wnl, remains afebrile.   Day 3 antibiotics  Physical Exam: Constitutional:  Vitals:   10/27/20 1631 10/27/20 2019  BP: (!) 127/101 115/82  Pulse: (!) 58 63  Resp: 17 11  Temp: 98.7 F (37.1 C) 98.4 F (36.9 C)  SpO2: 98% 93%   patient appears in NAD Respiratory: Normal respiratory effort; CTA B Cardiovascular: RRR GI: soft, nt, nd MS: hand wrapped  Review of Systems: Constitutional: negative for fevers and chills Gastrointestinal: negative for diarrhea  Lab Results  Component Value Date   WBC 8.9 10/28/2020   HGB 8.5 (L) 10/28/2020   HCT 27.7 (L) 10/28/2020   MCV 91.4 10/28/2020   PLT 511 (H) 10/28/2020    Lab Results  Component Value Date   CREATININE 0.97 10/28/2020   BUN 14 10/28/2020   NA 139 10/28/2020   K 3.9 10/28/2020   CL 100 10/28/2020   CO2 31 10/28/2020    Lab Results  Component Value Date   ALT 7 10/28/2020   AST 11 (L) 10/28/2020   ALKPHOS 100 10/28/2020     Microbiology: Recent Results (from the past 240 hour(s))  Resp Panel by RT-PCR (Flu A&B, Covid) Nasopharyngeal Swab     Status: None   Collection Time: 10/26/20  7:05 PM   Specimen: Nasopharyngeal Swab; Nasopharyngeal(NP) swabs in vial transport medium  Result Value Ref Range Status   SARS Coronavirus 2 by RT PCR NEGATIVE NEGATIVE Final    Comment: (NOTE) SARS-CoV-2 target nucleic acids are NOT DETECTED.  The SARS-CoV-2 RNA is generally detectable in upper respiratory specimens during the acute phase of infection. The lowest concentration of SARS-CoV-2 viral copies this assay can detect is 138 copies/mL. A negative result does not preclude SARS-Cov-2 infection and should not be used as the sole basis for treatment or other patient management decisions. A negative result may occur with  improper specimen collection/handling, submission of specimen  other than nasopharyngeal swab, presence of viral mutation(s) within the areas targeted by this assay, and inadequate number of viral copies(<138 copies/mL). A negative result must be combined with clinical observations, patient history, and epidemiological information. The expected result is Negative.  Fact Sheet for Patients:  EntrepreneurPulse.com.au  Fact Sheet for Healthcare Providers:  IncredibleEmployment.be  This test is no t yet approved or cleared by the [place] FDA and  has been authorized for detection and/or diagnosis of SARS-CoV-2 by FDA under an Emergency Use Authorization (EUA). This EUA will remain  in effect (meaning this test can be used) for the duration of the COVID-19 declaration under Section 564(b)(1) of the Act, 21 U.S.C.section 360bbb-3(b)(1), unless the authorization is terminated  or revoked sooner.       Influenza A by PCR NEGATIVE NEGATIVE Final   Influenza B by PCR NEGATIVE NEGATIVE Final    Comment: (NOTE) The Xpert Xpress SARS-CoV-2/FLU/RSV plus assay is intended as an aid in the diagnosis of influenza from Nasopharyngeal swab specimens and should not be used as a sole basis for treatment. Nasal washings and aspirates are unacceptable for Xpert Xpress SARS-CoV-2/FLU/RSV testing.  Fact Sheet for Patients: EntrepreneurPulse.com.au  Fact Sheet for Healthcare Providers: IncredibleEmployment.be  This test is not yet approved or cleared by the [place] FDA and has been authorized for detection and/or diagnosis of SARS-CoV-2 by FDA under an Emergency Use Authorization (EUA). This EUA will remain in effect (meaning this  test can be used) for the duration of the COVID-19 declaration under Section 564(b)(1) of the Act, 21 U.S.C. section 360bbb-3(b)(1), unless the authorization is terminated or revoked.  Performed at [facility], [place] [address], [place],  [place] [address]   Surgical pcr screen     Status: Abnormal   Collection Time: 10/26/20  7:06 PM   Specimen: Nasal Mucosa; Nasal Swab  Result Value Ref Range Status   MRSA, PCR POSITIVE (A) NEGATIVE Final    Comment: RESULT CALLED TO, READ BACK BY AND VERIFIED WITH: A. SOUTZ RN, AT [phone] 10/27/20 D. VANHOOK    Staphylococcus aureus POSITIVE (A) NEGATIVE Final    Comment: (NOTE) The Xpert SA Assay (FDA approved for NASAL specimens in patients 64 years of age and older), is one component of a comprehensive surveillance program. It is not intended to diagnose infection nor to guide or monitor treatment. Performed at [facility], [place] [address] [place], [place] [address]   Aerobic/Anaerobic Culture w Gram Stain (surgical/deep wound)     Status: None (Preliminary result)   Collection Time: 10/26/20  9:40 PM   Specimen: Abscess  Result Value Ref Range Status   Specimen Description ABSCESS  Final   Special Requests NONE  Final   Gram Stain   Final    MODERATE WBC PRESENT, PREDOMINANTLY PMN RARE GRAM POSITIVE COCCI IN CLUSTERS    Culture   Final    RARE STAPHYLOCOCCUS AUREUS SUSCEPTIBILITIES TO FOLLOW Performed at [facility], [place] [address], [place], [place] [address]    Report Status PENDING  Incomplete  Aerobic/Anaerobic Culture w Gram Stain (surgical/deep wound)     Status: None (Preliminary result)   Collection Time: 10/26/20  9:44 PM   Specimen: PATH Other; Tissue  Result Value Ref Range Status   Specimen Description ABSCESS  Final   Special Requests LEFT  Final   Gram Stain   Final    ABUNDANT WBC PRESENT, PREDOMINANTLY PMN FEW GRAM POSITIVE COCCI IN CLUSTERS MODERATE GRAM POSITIVE RODS    Culture   Final    MODERATE STAPHYLOCOCCUS AUREUS SUSCEPTIBILITIES TO FOLLOW Performed at [facility], [place] [address] [place], [place] [address]    Report Status PENDING  Incomplete  Culture, blood (routine x 2)     Status: None (Preliminary result)   Collection  Time: 10/27/20  1:46 PM   Specimen: BLOOD  Result Value Ref Range Status   Specimen Description BLOOD RIGHT ANTECUBITAL  Final   Special Requests   Final    BOTTLES DRAWN AEROBIC AND ANAEROBIC Blood Culture adequate volume   Culture   Final    NO GROWTH < 24 HOURS Performed at [facility], [place] [address], [place], [place] [address]    Report Status PENDING  Incomplete  Culture, blood (routine x 2)     Status: None (Preliminary result)   Collection Time: 10/27/20  1:54 PM   Specimen: BLOOD RIGHT FOREARM  Result Value Ref Range Status   Specimen Description BLOOD RIGHT FOREARM  Final   Special Requests   Final    BOTTLES DRAWN AEROBIC ONLY Blood Culture adequate volume   Culture   Final    NO GROWTH < 24 HOURS Performed at [facility], [place] [address] [place], [place] [address]    Report Status PENDING  Incomplete    Impression/Plan:  1. Multiple abscesses - growth from hand and right side both with Staph aureus on  culture.  Blood cultures ngtd but sent after starting antibiotics.  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  Will check a TTE and consider a TEE.    2.  History of endocarditis - as above, with her history, concern for a recurrence and checking an echo.   3.  History of C diff - no current diarrhea and will continue to monitor.    Dr. West Bali available over the weekend if needed, otherwise I will follow up again on Monday

## 2020-10-29 DIAGNOSIS — R609 Edema, unspecified: Secondary | ICD-10-CM

## 2020-10-29 LAB — CBC
HCT: 30 % — ABNORMAL LOW (ref 36.0–46.0)
Hemoglobin: 9.1 g/dL — ABNORMAL LOW (ref 12.0–15.0)
MCH: 28.3 pg (ref 26.0–34.0)
MCHC: 30.3 g/dL (ref 30.0–36.0)
MCV: 93.5 fL (ref 80.0–100.0)
Platelets: 498 10*3/uL — ABNORMAL HIGH (ref 150–400)
RBC: 3.21 MIL/uL — ABNORMAL LOW (ref 3.87–5.11)
RDW: 14.9 % (ref 11.5–15.5)
WBC: 8.6 10*3/uL (ref 4.0–10.5)
nRBC: 0 % (ref 0.0–0.2)

## 2020-10-29 LAB — BASIC METABOLIC PANEL
Anion gap: 8 (ref 5–15)
BUN: 16 mg/dL (ref 6–20)
CO2: 29 mmol/L (ref 22–32)
Calcium: 8.7 mg/dL — ABNORMAL LOW (ref 8.9–10.3)
Chloride: 102 mmol/L (ref 98–111)
Creatinine, Ser: 0.87 mg/dL (ref 0.44–1.00)
GFR, Estimated: >60 mL/min (ref >60)
Glucose, Bld: 99 mg/dL (ref 70–99)
Potassium: 4.2 mmol/L (ref 3.5–5.1)
Sodium: 139 mmol/L (ref 135–145)

## 2020-10-29 LAB — PROCALCITONIN: Procalcitonin: <0.1 ng/mL

## 2020-10-29 LAB — MAGNESIUM: Magnesium: 2.1 mg/dL (ref 1.7–2.4)

## 2020-10-29 LAB — VANCOMYCIN, PEAK: Vancomycin Pk: 43 ug/mL — ABNORMAL HIGH (ref 30–40)

## 2020-10-29 NOTE — Progress Notes (Signed)
Pharmacy Antibiotic Note  Kelly [name] is a 35 y.o. 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  Pharmacy has been consulted for vancomycin dosing.  WBC 8.6, afebrile; Scr 0.87, CrCl 101.6 ml/min    Plan: Vancomycin 1250 mg IV Q 12 hrs (estimated vancomycin AUC with this regimen, using Sr 0.87, is 519.5; goal vancomycin AUC is 400-550) Monitor WBC, temp, clinical improvement, cultures, renal function Well obtain vancomycin peak and trough to evaluate therapy   Height: 5' 7.5" (171.5 cm) Weight: 85.8 kg (189 lb 2.5 oz) IBW/kg (Calculated) : 62.75  Temp (24hrs), Avg:98 F (36.7 C), Min:97.6 F (36.4 C), Max:98.2 F (36.8 C)  Recent Labs  Lab 10/26/20 1756 10/27/20 0043 10/28/20 0038 10/29/20 0200  WBC 10.2 8.8 8.9 8.6  CREATININE 0.87 0.94 0.97 0.87     Estimated Creatinine Clearance: 101.6 mL/min (by C-G formula based on SCr of 0.87 mg/dL).    Allergies  Allergen Reactions   Hydrocodone Itching   Morphine And Related Nausea And Vomiting    Antimicrobials this admission: Vancomycin 6/15 >>  Microbiology results: 6/16 Surgical PCR >> MRSA positive  6/15 Wound Cx >> rare gram positive cocci in clusters  6/16 Bcx >> ngtd    Thank you for allowing pharmacy to be a part of this patient's care.  Cephus Slater, PharmD, MBA Pharmacy Resident [phone] 10/29/2020 1:24 PM

## 2020-10-29 NOTE — Discharge Instructions (Addendum)
Please wash your left breast and bilateral thigh wounds with soap and water twice daily. Please cover thigh wounds with a dry dressing and change twice daily. Loosely pack your left breast wound with wet to dry dressing change as noted below.   Wet to Dry WOUND CARE: - Change dressing twice daily - Supplies: sterile saline, kerlex, scissors, ABD pads, tape  Remove dressing and all packing carefully, moistening with sterile saline as needed to avoid packing/internal dressing sticking to the wound. 2.   Clean edges of skin around the wound with water/gauze, making sure there is no tape debris or leakage left on skin that could cause skin irritation or breakdown. 3.   Dampen and clean kerlex with sterile saline and pack wound from wound base to skin level, making sure to take note of any possible areas of wound tracking, tunneling and packing appropriately. Wound can be packed loosely. Trim kerlex to size if a whole kerlex is not required. 4.   Cover wound with a dry ABD pad or gauze and secure with tape.  5.   Write the date/time on the dry dressing/tape to better track when the last dressing change occurred. - change dressing as needed if leakage occurs, wound gets contaminated, or patient requests to shower. -- Medical grade tape as well as packing supplies can be found at [employer] on [address] or [employer] on [address]. The remaining supplies can be found at your local drug store, [employer] etc.  Contact our office or go to the emergency department if: You have a fever or chills. You have more redness, swelling, or pain around your incision. You have more fluid or blood coming from your incision. Your incision feels warm to the touch. You have pus or a bad smell coming from your incision. You have red streaks above or below the incision site. You have any other concerns     Follow with Primary MD Pa, [facility] in 7 days   Get CBC, CMP, 2  view Chest X ray -  checked next visit within 1 week by Primary MD   Activity: As tolerated with Full fall precautions use walker/cane & assistance as needed  Disposition Home   Diet: Heart Healthy    Special Instructions: If you have smoked or chewed Tobacco  in the last 2 yrs please stop smoking, stop any regular Alcohol  and or any Recreational drug use.  On your next visit with your primary care physician please Get Medicines reviewed and adjusted.  Please request your Prim.MD to go over all Hospital Tests and Procedure/Radiological results at the follow up, please get all Hospital records sent to your Prim MD by signing hospital release before you go home.  If you experience worsening of your admission symptoms, develop shortness of breath, life threatening emergency, suicidal or homicidal thoughts you must seek medical attention immediately by calling 911 or calling your MD immediately  if symptoms less severe.  You Must read complete instructions/literature along with all the possible adverse reactions/side effects for all the Medicines you take and that have been prescribed to you. Take any new Medicines after you have completely understood and accpet all the possible adverse reactions/side effects.   Do not drive, operate heavy machinery, perform activities at heights, swimming or participation in water activities or provide baby sitting services if your were admitted for syncope or siezures until you have seen by Primary MD or a Neurologist and advised to do so again.  Do not drive when taking Pain medications.  Do not take more than prescribed Pain, Sleep and Anxiety Medications  Wear Seat belts while driving.   Please note  You were cared for by a hospitalist during your hospital stay. If you have any questions about your discharge medications or the care you received while you were in the hospital after you are discharged, you can call the unit and asked to speak with the  hospitalist on call if the hospitalist that took care of you is not available. Once you are discharged, your primary care physician will handle any further medical issues. Please note that NO REFILLS for any discharge medications will be authorized once you are discharged, as it is imperative that you return to your primary care physician (or establish a relationship with a primary care physician if you do not have one) for your aftercare needs so that they can reassess your need for medications and monitor your lab values.

## 2020-10-29 NOTE — Progress Notes (Signed)
PROGRESS NOTE        PATIENT DETAILS Name: Kelly [name] Age: 35 y.o. Sex: female Date of Birth: 13-May-1986 Admit Date: 10/26/2020 Admitting Physician Lequita Halt, MD PCP:[facility]  Brief Narrative: Patient is a 35 y.o. 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  She has a history of IV heroin/cocaine use-but claims she has been clean for several months.  UDS negative.  Significant events: 6/15>> admit for left breast/right thigh/left hand abscess.  Significant studies: 5/12>> TTE: EF 91-50%, RV systolic function moderately reduced.  Severe TR. 6/15>> CXR: No pneumonia 6/15>> x-ray left hand: No fracture/dislocation-dorsal soft tissue swelling  Antimicrobial therapy: Vancomycin: 6/15>>  Microbiology data: 6/15>> COVID/influenza PCR: Negative 6/15>> abscess cultures: Staff aureus (sensitivities pending) 6/16>> blood cultures: No growth  Procedures : 6/15>> I&D of left breast/right thigh/left thigh/left hand abscess by general surgery and orthopedics.  Consults: General surgery, hand surgery  DVT Prophylaxis : enoxaparin (LOVENOX) injection 40 mg Start: 10/28/20 1630 SCD's Start: 10/27/20 0030   Subjective:  Patient in bed, appears comfortable, denies any headache, no fever, no chest pain or pressure, no shortness of breath , no abdominal pain. No new focal weakness. Some L arm pain.   Assessment/Plan:  Left breast/right/left thigh and left hand abscess: S/p I&D by general surgery/hand surgery-wound cultures positive for staph aureus-remains on IV vancomycin.  ID also following.  Unfortunately blood cultures were not drawn on admission-they were done the next day-while patient was already on IV antibiotics-negative so far.   History of recurrent tricuspid valve endocarditis (MSSA 2019,  Enterococcus 2020, MRSA 2022): Concerned that she may have underlying endocarditis given her presentation-unfortunately blood cultures were done after patient received antibiotics-on IV vancomycin-we will await further recommendations from infectious disease.    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 difficile colitis-denies any diarrhea  History of syphilis: Stable for further treatment by ID MD outpatient setting  Recent vaginal birth/delivery: She is not breast-feeding-claims that her 1 month child is  with family members.  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  Diet: Diet Order             Diet regular Room service appropriate? Yes; Fluid consistency: Thin  Diet effective now                    Code Status: Full code   Family Communication: None at bedside  Disposition Plan: Status is: Inpatient  Remains inpatient appropriate because:Inpatient level of care appropriate due to severity of illness  Dispo: The patient is from: Home              Anticipated d/c is to: Home              Patient currently is not medically stable to d/c.   Difficult to place patient No   Barriers to Discharge: Multiple abscesses-needs ongoing IV antibiotics-awaiting culture data.  Antimicrobial agents: Anti-infectives (From admission, onward)    Start     Dose/Rate Route Frequency Ordered Stop   10/27/20 0530  Vancomycin (VANCOCIN) 1,250 mg in sodium chloride 0.9 % 250 mL IVPB  1,250 mg 166.7 mL/hr over 90 Minutes Intravenous Every 12 hours 10/26/20 1932     10/26/20 1730  vancomycin (VANCOREADY) IVPB 1750 mg/350 mL        1,750 mg 175 mL/hr over 120 Minutes Intravenous  Once 10/26/20 1712 10/26/20 1946        Time spent: 25 minutes-Greater than 50% of this time was spent in counseling, explanation of diagnosis, planning of further management, and  coordination of care.  MEDICATIONS: Scheduled Meds:  acidophilus  2 capsule Oral TID   Chlorhexidine Gluconate Cloth  6 each Topical Q0600   enoxaparin (LOVENOX) injection  40 mg Subcutaneous Q24H   furosemide  40 mg Oral Daily   methadone  130 mg Oral Daily   mupirocin ointment  1 application Nasal BID   NIFEdipine  30 mg Oral Daily   prenatal vitamin w/FE, FA  1 tablet Oral Q1200   sertraline  50 mg Oral Daily   Continuous Infusions:  vancomycin 1,250 mg (10/29/20 0610)   PRN Meds:.acetaminophen, HYDROmorphone (DILAUDID) injection, ketorolac, methocarbamol   PHYSICAL EXAM: Vital signs: Vitals:   10/28/20 1615 10/28/20 2128 10/29/20 0401 10/29/20 1146  BP: 125/83 120/88 (!) 132/91 (!) 129/99  Pulse: (!) 58 60 60 63  Resp: (!) 9 11 10 19   Temp: 97.6 F (36.4 C) 98 F (36.7 C) 98 F (36.7 C) 98.2 F (36.8 C)  TempSrc: Oral Oral Oral Oral  SpO2: 99% 96% 97% 93%  Weight:      Height:       Filed Weights   10/26/20 1649  Weight: 85.8 kg   Body mass index is 29.19 kg/m.   Gen Exam:  Awake Alert, No new F.N deficits, Normal affect Martin.AT,PERRAL Supple Neck,No JVD, No cervical lymphadenopathy appriciated.  Symmetrical Chest wall movement, Good air movement bilaterally, CTAB RRR,No Gallops, Rubs or new Murmurs, No Parasternal Heave +ve B.Sounds, Abd Soft, No tenderness, No organomegaly appriciated, No rebound - guarding or rigidity. No Cyanosis, L arm and hand under bandage   I have personally reviewed following labs and imaging studies  LABORATORY DATA: CBC: Recent Labs  Lab 10/26/20 1756 10/27/20 0043 10/28/20 0038 10/29/20 0200  WBC 10.2 8.8 8.9 8.6  HGB 8.9* 8.8* 8.5* 9.1*  HCT 28.1* 28.2* 27.7* 30.0*  MCV 89.5 90.7 91.4 93.5  PLT 492* 446* 511* 498*    Basic Metabolic Panel: Recent Labs  Lab 10/26/20 1756 10/27/20 0043 10/28/20 0038 10/29/20 0200  NA 138 137 139 139  K 3.0* 2.9* 3.9 4.2  CL 95* 96* 100 102  CO2 31 28 31 29   GLUCOSE 87  123* 90 99  BUN 9 9 14 16   CREATININE 0.87 0.94 0.97 0.87  CALCIUM 8.3* 8.3* 8.5* 8.7*  MG  --   --  1.7 2.1    GFR: Estimated Creatinine Clearance: 101.6 mL/min (by C-G formula based on SCr of 0.87 mg/dL).  Liver Function Tests: Recent Labs  Lab 10/26/20 1756 10/28/20 0038  AST 15 11*  ALT 7 7  ALKPHOS 95 100  BILITOT 0.6 0.6  PROT 6.1* 5.9*  ALBUMIN 2.4* 2.3*   No results for input(s): LIPASE, AMYLASE in the last 168 hours. No results for input(s): AMMONIA in the last 168 hours.  Coagulation Profile: No results for input(s): INR, PROTIME in the last 168 hours.  Cardiac Enzymes: No results for input(s): CKTOTAL, CKMB, CKMBINDEX, TROPONINI in the last 168 hours.  BNP (last 3 results) No results for input(s): PROBNP in the last 8760  hours.  Lipid Profile: No results for input(s): CHOL, HDL, LDLCALC, TRIG, CHOLHDL, LDLDIRECT in the last 72 hours.  Thyroid Function Tests: No results for input(s): TSH, T4TOTAL, FREET4, T3FREE, THYROIDAB in the last 72 hours.  Anemia Panel: No results for input(s): VITAMINB12, FOLATE, FERRITIN, TIBC, IRON, RETICCTPCT in the last 72 hours.  Urine analysis:    Component Value Date/Time   COLORURINE AMBER (A) 07/13/2020 1320   APPEARANCEUR CLOUDY (A) 07/13/2020 1320   APPEARANCEUR Hazy 07/10/2013 1121   LABSPEC 1.028 07/13/2020 1320   LABSPEC 1.020 07/10/2013 1121   PHURINE 5.0 07/13/2020 1320   GLUCOSEU NEGATIVE 07/13/2020 1320   GLUCOSEU Negative 07/10/2013 1121   HGBUR SMALL (A) 07/13/2020 1320   BILIRUBINUR NEGATIVE 07/13/2020 1320   BILIRUBINUR Negative 07/10/2013 1121   KETONESUR 5 (A) 07/13/2020 1320   PROTEINUR 100 (A) 07/13/2020 1320   UROBILINOGEN 1.0 02/15/2015 0530   NITRITE POSITIVE (A) 07/13/2020 1320   LEUKOCYTESUR LARGE (A) 07/13/2020 1320   LEUKOCYTESUR Trace 07/10/2013 1121    Sepsis Labs: Lactic Acid, Venous    Component Value Date/Time   LATICACIDVEN 1.5 07/14/2020 0021    MICROBIOLOGY: Recent  Results (from the past 240 hour(s))  Resp Panel by RT-PCR (Flu A&B, Covid) Nasopharyngeal Swab     Status: None   Collection Time: 10/26/20  7:05 PM   Specimen: Nasopharyngeal Swab; Nasopharyngeal(NP) swabs in vial transport medium  Result Value Ref Range Status   SARS Coronavirus 2 by RT PCR NEGATIVE NEGATIVE Final    Comment: (NOTE) SARS-CoV-2 target nucleic acids are NOT DETECTED.  The SARS-CoV-2 RNA is generally detectable in upper respiratory specimens during the acute phase of infection. The lowest concentration of SARS-CoV-2 viral copies this assay can detect is 138 copies/mL. A negative result does not preclude SARS-Cov-2 infection and should not be used as the sole basis for treatment or other patient management decisions. A negative result may occur with  improper specimen collection/handling, submission of specimen other than nasopharyngeal swab, presence of viral mutation(s) within the areas targeted by this assay, and inadequate number of viral copies(<138 copies/mL). A negative result must be combined with clinical observations, patient history, and epidemiological information. The expected result is Negative.  Fact Sheet for Patients:  EntrepreneurPulse.com.au  Fact Sheet for Healthcare Providers:  IncredibleEmployment.be  This test is no t yet approved or cleared by the [place] FDA and  has been authorized for detection and/or diagnosis of SARS-CoV-2 by FDA under an Emergency Use Authorization (EUA). This EUA will remain  in effect (meaning this test can be used) for the duration of the COVID-19 declaration under Section 564(b)(1) of the Act, 21 U.S.C.section 360bbb-3(b)(1), unless the authorization is terminated  or revoked sooner.       Influenza A by PCR NEGATIVE NEGATIVE Final   Influenza B by PCR NEGATIVE NEGATIVE Final    Comment: (NOTE) The Xpert Xpress SARS-CoV-2/FLU/RSV plus assay is intended as an aid in the  diagnosis of influenza from Nasopharyngeal swab specimens and should not be used as a sole basis for treatment. Nasal washings and aspirates are unacceptable for Xpert Xpress SARS-CoV-2/FLU/RSV testing.  Fact Sheet for Patients: EntrepreneurPulse.com.au  Fact Sheet for Healthcare Providers: IncredibleEmployment.be  This test is not yet approved or cleared by the [place] FDA and has been authorized for detection and/or diagnosis of SARS-CoV-2 by FDA under an Emergency Use Authorization (EUA). This EUA will remain in effect (meaning this test can be used) for the duration of the COVID-19 declaration under  Section 564(b)(1) of the Act, 21 U.S.C. section 360bbb-3(b)(1), unless the authorization is terminated or revoked.  Performed at [facility], [place] [address], [place], [place] [address]   Surgical pcr screen     Status: Abnormal   Collection Time: 10/26/20  7:06 PM   Specimen: Nasal Mucosa; Nasal Swab  Result Value Ref Range Status   MRSA, PCR POSITIVE (A) NEGATIVE Final    Comment: RESULT CALLED TO, READ BACK BY AND VERIFIED WITH: A. SOUTZ RN, AT [phone] 10/27/20 D. VANHOOK    Staphylococcus aureus POSITIVE (A) NEGATIVE Final    Comment: (NOTE) The Xpert SA Assay (FDA approved for NASAL specimens in patients 28 years of age and older), is one component of a comprehensive surveillance program. It is not intended to diagnose infection nor to guide or monitor treatment. Performed at [facility], [place] [address] [place], [place] [address]   Aerobic/Anaerobic Culture w Gram Stain (surgical/deep wound)     Status: None (Preliminary result)   Collection Time: 10/26/20  9:40 PM   Specimen: Abscess  Result Value Ref Range Status   Specimen Description ABSCESS  Final   Special Requests NONE  Final   Gram Stain   Final    MODERATE WBC PRESENT, PREDOMINANTLY PMN RARE GRAM POSITIVE COCCI IN CLUSTERS    Culture   Final    RARE  STAPHYLOCOCCUS AUREUS SUSCEPTIBILITIES TO FOLLOW Performed at [facility], [place] [address], [place], [place] [address]    Report Status PENDING  Incomplete  Aerobic/Anaerobic Culture w Gram Stain (surgical/deep wound)     Status: None (Preliminary result)   Collection Time: 10/26/20  9:44 PM   Specimen: PATH Other; Tissue  Result Value Ref Range Status   Specimen Description ABSCESS  Final   Special Requests LEFT  Final   Gram Stain   Final    ABUNDANT WBC PRESENT, PREDOMINANTLY PMN FEW GRAM POSITIVE COCCI IN CLUSTERS MODERATE GRAM POSITIVE RODS    Culture   Final    MODERATE STAPHYLOCOCCUS AUREUS SUSCEPTIBILITIES TO FOLLOW Performed at [facility], [place] [address], [place], [place] [address]    Report Status PENDING  Incomplete  Culture, blood (routine x 2)     Status: None (Preliminary result)   Collection Time: 10/27/20  1:46 PM   Specimen: BLOOD  Result Value Ref Range Status   Specimen Description BLOOD RIGHT ANTECUBITAL  Final   Special Requests   Final    BOTTLES DRAWN AEROBIC AND ANAEROBIC Blood Culture adequate volume   Culture   Final    NO GROWTH < 24 HOURS Performed at [facility], [place] [address] [place], [place] [address]    Report Status PENDING  Incomplete  Culture, blood (routine x 2)     Status: None (Preliminary result)   Collection Time: 10/27/20  1:54 PM   Specimen: BLOOD RIGHT FOREARM  Result Value Ref Range Status   Specimen Description BLOOD RIGHT FOREARM  Final   Special Requests   Final    BOTTLES DRAWN AEROBIC ONLY Blood Culture adequate volume   Culture   Final    NO GROWTH < 24 HOURS Performed at [facility], [place] [address], [place], [place] [address]    Report Status PENDING  Incomplete    RADIOLOGY STUDIES/RESULTS: No results found.   LOS: 3 days   Signature  Lala Lund M.D on 10/29/2020 at 12:55 PM   -  To page go to  [email or web address]

## 2020-10-29 NOTE — Progress Notes (Signed)
Patient was seen and evaluated today.  The wound was examined on the left hand and left leg much better.  The packing was removed.  Patient tolerated the dressing change.  The patient can continue with the current dressing on for the next 10 to 14 days.  She will need to follow-up Outpatient with [place] to Look at the Wound and Take the Sutures out.  Please Contact us Should Any Issues or Concerns Arise.  No Further Management Other Than Just the Dressing Care Is Needed for the Left Hand.

## 2020-10-29 NOTE — Progress Notes (Signed)
3 Days Post-Op  Subjective: CC: Still having some pain over incisions. Afebrile overnight. WBC wnl.   Objective: Vital signs in last 24 hours: Temp:  [97.6 F (36.4 C)-98 F (36.7 C)] 98 F (36.7 C) (06/18 0401) Pulse Rate:  [58-60] 60 (06/18 0401) Resp:  [9-11] 10 (06/18 0401) BP: (120-132)/(83-91) 132/91 (06/18 0401) SpO2:  [96 %-99 %] 97 % (06/18 0401) Last BM Date: 10/28/20  Intake/Output from previous day: 06/17 0701 - 06/18 0700 In: 250 [IV Piggyback:250] Out: -  Intake/Output this shift: No intake/output data recorded.  PE: Chaperone present, RN - Kelley Wounds - L breast: Appears stable. No signs of surrounding erythema or induration to suggest cellulitis. No fluctuance. No purulent drainage or necrosis.  - R thigh: Appears stable. No signs of surrounding erythema or induration to suggest cellulitis. No fluctuance. No purulent drainage or necrosis.  - L thigh: Appears stable. No signs of surrounding erythema or induration to suggest cellulitis. No fluctuance. No purulent drainage or necrosis.   Lab Results:  Recent Labs    10/28/20 0038 10/29/20 0200  WBC 8.9 8.6  HGB 8.5* 9.1*  HCT 27.7* 30.0*  PLT 511* 498*   BMET Recent Labs    10/28/20 0038 10/29/20 0200  NA 139 139  K 3.9 4.2  CL 100 102  CO2 31 29  GLUCOSE 90 99  BUN 14 16  CREATININE 0.97 0.87  CALCIUM 8.5* 8.7*   PT/INR No results for input(s): LABPROT, INR in the last 72 hours. CMP     Component Value Date/Time   NA 139 10/29/2020 0200   NA 136 07/02/2013 0547   K 4.2 10/29/2020 0200   K 3.7 07/02/2013 0547   CL 102 10/29/2020 0200   CL 102 07/02/2013 0547   CO2 29 10/29/2020 0200   CO2 30 07/02/2013 0547   GLUCOSE 99 10/29/2020 0200   GLUCOSE 86 07/28/2020 0626   BUN 16 10/29/2020 0200   BUN 7 07/02/2013 0547   CREATININE 0.87 10/29/2020 0200   CREATININE 0.82 07/02/2013 0547   CALCIUM 8.7 (L) 10/29/2020 0200   CALCIUM 8.8 07/02/2013 0547   PROT 5.9 (L) 10/28/2020  0038   PROT 7.7 07/10/2013 1121   ALBUMIN 2.3 (L) 10/28/2020 0038   ALBUMIN 3.4 07/10/2013 1121   AST 11 (L) 10/28/2020 0038   AST 115 (H) 07/10/2013 1121   ALT 7 10/28/2020 0038   ALT 95 (H) 07/10/2013 1121   ALKPHOS 100 10/28/2020 0038   ALKPHOS 120 (H) 07/10/2013 1121   BILITOT 0.6 10/28/2020 0038   BILITOT 1.9 (H) 07/10/2013 1121   GFRNONAA >60 10/29/2020 0200   GFRNONAA >60 07/02/2013 0547   GFRAA >60 06/26/2019 1812   GFRAA >60 07/02/2013 0547   Lipase     Component Value Date/Time   LIPASE 19 07/13/2020 1035   LIPASE 80 06/29/2013 0454       Studies/Results: No results found.  Anti-infectives: Anti-infectives (From admission, onward)    Start     Dose/Rate Route Frequency Ordered Stop   10/27/20 0530  Vancomycin (VANCOCIN) 1,250 mg in sodium chloride 0.9 % 250 mL IVPB        1,250 mg 166.7 mL/hr over 90 Minutes Intravenous Every 12 hours 10/26/20 1932     10/26/20 1730  vancomycin (VANCOREADY) IVPB 1750 mg/350 mL        1,750 mg 175 mL/hr over 120 Minutes Intravenous  Once 10/26/20 1712 10/26/20 1946        Assessment/Plan  POD 3 S/P I&D bilateral upper thigh abscesses, L breast abscess, CW 6/15 for Multiple soft tissue abscesses - BID dressing changes. Wet to dry on left breast with minimal superficial packing. Dry gauze on b/l thighs - multimodal pain control in addition to methadone. - continue IV Abx per ID. Follow cultures - currently S. Aureus  - Wounds stable. No indication for further surgery at this time. We will be available as needed.    FEN: regular ID: vancomycin 6/15 >> VTE: SCDs, okay for chemical prophylaxis from our standpoint   L hand abscess - s/p I&D by Dr. Apolonio Schneiders - per Hand Surgery  Per medicine IVDA Endocarditis Heart failure HTN HCV syphilis   LOS: 3 days    Jillyn Ledger , [facility] 10/29/2020, 8:34 AM Please see Amion for pager number during day hours 7:00am-4:30pm

## 2020-10-30 ENCOUNTER — Inpatient Hospital Stay (HOSPITAL_COMMUNITY): Payer: Medicaid Other

## 2020-10-30 DIAGNOSIS — I38 Endocarditis, valve unspecified: Secondary | ICD-10-CM

## 2020-10-30 DIAGNOSIS — I361 Nonrheumatic tricuspid (valve) insufficiency: Secondary | ICD-10-CM

## 2020-10-30 LAB — CBC WITH DIFFERENTIAL/PLATELET
Abs Immature Granulocytes: 0.14 10*3/uL — ABNORMAL HIGH (ref 0.00–0.07)
Basophils Absolute: 0.1 10*3/uL (ref 0.0–0.1)
Basophils Relative: 1 %
Eosinophils Absolute: 0.2 10*3/uL (ref 0.0–0.5)
Eosinophils Relative: 3 %
HCT: 30.8 % — ABNORMAL LOW (ref 36.0–46.0)
Hemoglobin: 9.3 g/dL — ABNORMAL LOW (ref 12.0–15.0)
Immature Granulocytes: 2 %
Lymphocytes Relative: 30 %
Lymphs Abs: 2.6 10*3/uL (ref 0.7–4.0)
MCH: 27.8 pg (ref 26.0–34.0)
MCHC: 30.2 g/dL (ref 30.0–36.0)
MCV: 91.9 fL (ref 80.0–100.0)
Monocytes Absolute: 0.5 10*3/uL (ref 0.1–1.0)
Monocytes Relative: 6 %
Neutro Abs: 5 10*3/uL (ref 1.7–7.7)
Neutrophils Relative %: 58 %
Platelets: 545 10*3/uL — ABNORMAL HIGH (ref 150–400)
RBC: 3.35 MIL/uL — ABNORMAL LOW (ref 3.87–5.11)
RDW: 15 % (ref 11.5–15.5)
WBC: 8.4 10*3/uL (ref 4.0–10.5)
nRBC: 0 % (ref 0.0–0.2)

## 2020-10-30 LAB — C-REACTIVE PROTEIN: CRP: 0.9 mg/dL (ref <1.0)

## 2020-10-30 LAB — ECHOCARDIOGRAM LIMITED
Area-P 1/2: 2.76 cm2
Height: 67.5 in
S' Lateral: 3.5 cm
Weight: 3026.47 oz

## 2020-10-30 LAB — COMPREHENSIVE METABOLIC PANEL
ALT: 6 U/L (ref 0–44)
AST: 12 U/L — ABNORMAL LOW (ref 15–41)
Albumin: 2.6 g/dL — ABNORMAL LOW (ref 3.5–5.0)
Alkaline Phosphatase: 99 U/L (ref 38–126)
Anion gap: 10 (ref 5–15)
BUN: 15 mg/dL (ref 6–20)
CO2: 28 mmol/L (ref 22–32)
Calcium: 9.2 mg/dL (ref 8.9–10.3)
Chloride: 99 mmol/L (ref 98–111)
Creatinine, Ser: 0.87 mg/dL (ref 0.44–1.00)
GFR, Estimated: >60 mL/min (ref >60)
Glucose, Bld: 93 mg/dL (ref 70–99)
Potassium: 4.4 mmol/L (ref 3.5–5.1)
Sodium: 137 mmol/L (ref 135–145)
Total Bilirubin: 0.7 mg/dL (ref 0.3–1.2)
Total Protein: 6.5 g/dL (ref 6.5–8.1)

## 2020-10-30 LAB — PROCALCITONIN: Procalcitonin: <0.1 ng/mL

## 2020-10-30 LAB — MAGNESIUM: Magnesium: 2 mg/dL (ref 1.7–2.4)

## 2020-10-30 LAB — VANCOMYCIN, TROUGH: Vancomycin Tr: 22 ug/mL (ref 15–20)

## 2020-10-30 MED ORDER — VANCOMYCIN HCL IN DEXTROSE 750-5 MG/150ML-% IV SOLN
750.0000 mg | Freq: Two times a day (BID) | INTRAVENOUS | Status: DC
  Administered 2020-10-30 – 2020-11-04 (×10): 750 mg via INTRAVENOUS
  Filled 2020-10-30 (×11): qty 150

## 2020-10-30 MED ORDER — SODIUM CHLORIDE 0.9 % IV SOLN
3.0000 g | Freq: Three times a day (TID) | INTRAVENOUS | Status: DC
  Administered 2020-10-30 – 2020-11-04 (×15): 3 g via INTRAVENOUS
  Filled 2020-10-30: qty 3
  Filled 2020-10-30 (×5): qty 8
  Filled 2020-10-30: qty 3
  Filled 2020-10-30 (×3): qty 8
  Filled 2020-10-30: qty 3
  Filled 2020-10-30 (×5): qty 8
  Filled 2020-10-30 (×2): qty 3

## 2020-10-30 NOTE — Progress Notes (Addendum)
Result Notes      important suggestion  Newer results are available. Click to view them now.   Component 4 d ago   Specimen Description ABSCESS   Special Requests LEFT   Gram Stain ABUNDANT WBC PRESENT, PREDOMINANTLY PMN  FEW GRAM POSITIVE COCCI IN CLUSTERS  MODERATE GRAM POSITIVE RODS  Performed at [facility], [place] [address], [place], [place] [address]   Culture MODERATE METHICILLIN RESISTANT STAPHYLOCOCCUS AUREUS  FEW EIKENELLA CORRODENS  Usually susceptible to penicillin and other beta lactam agents,quinolones,macrolides and tetracyclines.  NO ANAEROBES ISOLATED; CULTURE IN PROGRESS FOR 5 DAYS   Report Status PENDING   Organism ID, Bacteria METHICILLIN RESISTANT STAPHYLOCOCCUS AUREUS   Resulting Agency [facility]      Susceptibility   Methicillin resistant staphylococcus aureus    MIC    CIPROFLOXACIN >=8 RESISTANT  Resistant    CLINDAMYCIN <=0.25 SENS... Sensitive    ERYTHROMYCIN >=8 RESISTANT  Resistant    GENTAMICIN <=0.5 SENSI... Sensitive    Inducible Clindamycin NEGATIVE  Sensitive    OXACILLIN >=4 RESISTANT  Resistant    RIFAMPIN <=0.5 SENSI... Sensitive    TETRACYCLINE >=16 RESIST... Resistant    TRIMETH/SULFA 160 RESISTANT  Resistant    VANCOMYCIN <=0.5 SENSI... Sensitive            Susceptibility Comments  Methicillin resistant staphylococcus aureus  MODERATE METHICILLIN RESISTANT STAPHYLOCOCCUS AUREUS          Recommendations Vancomycin dosing per pharmacy, trough 22 Add Unasyn for Eikenella corrodens   Dr Linus Salmons will be back tomorrow  Rosiland Oz, MD Infectious Disease Physician [facility] [address] [place], [place] [address] Phone: [phone]  Fax: [phone]

## 2020-10-30 NOTE — Progress Notes (Signed)
CRITICAL VALUE STICKER  CRITICAL VALUE: Vanc/Trough = 22  RECEIVER (on-site recipient of call): Eliezer Champagne RN  [place] NOTIFIED: 10/30/2020 @ [place]: [phone]   RESPONSE: Pt currently getting dose of vancomycin 1250 mg. Pharmacy stated to complete this dose of vancomycin and they would adjust the next dose. Will relay information to day shift RN and continue to monitor patient.

## 2020-10-30 NOTE — Progress Notes (Addendum)
PROGRESS NOTE        PATIENT DETAILS Name: Kelly [name] Age: 35 y.o. Sex: female Date of Birth: 30-Sep-1985 Admit Date: 10/26/2020 Admitting Physician Lequita Halt, MD PCP:[facility], [facility]  Brief Narrative: Patient is a 35 y.o. 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  She has a history of IV heroin/cocaine use-but claims she has been clean for several months.  UDS negative.  Significant events: 6/15>> admit for left breast/right thigh/left hand abscess.  Significant studies: 5/12>> TTE: EF 12-45%, RV systolic function moderately reduced.  Severe TR. 6/15>> CXR: No pneumonia 6/15>> x-ray left hand: No fracture/dislocation-dorsal soft tissue swelling  Antimicrobial therapy: Vancomycin: 6/15>>  Microbiology data: 6/15>> COVID/influenza PCR: Negative 6/15>> abscess cultures: Staff aureus (sensitivities pending) 6/16>> blood cultures: No growth  Procedures : 6/15>> I&D of left breast/right thigh/left thigh/left hand abscess by general surgery and orthopedics.  Consults: General surgery, hand surgery  DVT Prophylaxis : enoxaparin (LOVENOX) injection 40 mg Start: 10/28/20 1630 SCD's Start: 10/27/20 0030   Subjective:  Patient in bed, appears comfortable, denies any headache, no fever, no chest pain or pressure, no shortness of breath , no abdominal pain. No new focal weakness. Mild L arm pain.  Assessment/Plan:  Left breast/right/left thigh and left hand abscess: S/p I&D by general surgery/hand surgery-wound cultures positive for staph aureus-remains on IV Vancomycin and Unasyn.  ID also following.  Procalcitonin and blood cultures so far negative will defer management of this issue to ID.  History of recurrent tricuspid valve endocarditis (MSSA 2019, Enterococcus 2020, MRSA 2022): Concerned that  she may have underlying endocarditis given her presentation-unfortunately blood cultures were done after patient received antibiotics-ID is following and directing antibiotics.  Repeat echocardiogram results pending.  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 difficile colitis-denies any diarrhea  History of syphilis: Stable for further treatment by ID MD outpatient setting  Recent vaginal birth/delivery: She is not breast-feeding-claims that her 1 month child is  with family members.  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  Diet: Diet Order             Diet regular Room service appropriate? Yes; Fluid consistency: Thin  Diet effective now                    Code Status: Full code   Family Communication: None at bedside  Disposition Plan: Status is: Inpatient  Remains inpatient appropriate because:Inpatient level of care appropriate due to severity of illness  Dispo: The patient is from: Home              Anticipated d/c is to: Home              Patient currently is not medically stable to d/c.   Difficult to place patient No   Barriers to Discharge: Multiple abscesses-needs ongoing IV antibiotics-awaiting culture data.  Antimicrobial agents: Anti-infectives (From admission, onward)    Start     Dose/Rate Route Frequency Ordered Stop   10/30/20 0930  Ampicillin-Sulbactam (UNASYN) 3 g in sodium chloride 0.9 % 100 mL IVPB        3 g 200 mL/hr over 30  Minutes Intravenous Every 8 hours 10/30/20 0838     10/27/20 0530  Vancomycin (VANCOCIN) 1,250 mg in sodium chloride 0.9 % 250 mL IVPB        1,250 mg 166.7 mL/hr over 90 Minutes Intravenous Every 12 hours 10/26/20 1932     10/26/20 1730  vancomycin (VANCOREADY) IVPB 1750 mg/350 mL        1,750 mg 175 mL/hr over 120 Minutes Intravenous  Once 10/26/20 1712 10/26/20 1946        Time spent: 25  minutes-Greater than 50% of this time was spent in counseling, explanation of diagnosis, planning of further management, and coordination of care.  MEDICATIONS: Scheduled Meds:  acidophilus  2 capsule Oral TID   Chlorhexidine Gluconate Cloth  6 each Topical Q0600   enoxaparin (LOVENOX) injection  40 mg Subcutaneous Q24H   furosemide  40 mg Oral Daily   methadone  130 mg Oral Daily   mupirocin ointment  1 application Nasal BID   NIFEdipine  30 mg Oral Daily   prenatal vitamin w/FE, FA  1 tablet Oral Q1200   sertraline  50 mg Oral Daily   Continuous Infusions:  ampicillin-sulbactam (UNASYN) IV     vancomycin 1,250 mg (10/30/20 0553)   PRN Meds:.acetaminophen, HYDROmorphone (DILAUDID) injection, ketorolac, methocarbamol   PHYSICAL EXAM: Vital signs: Vitals:   10/29/20 1146 10/29/20 2011 10/30/20 0557 10/30/20 0751  BP: (!) 129/99 123/90 130/86 (!) 136/98  Pulse: 63 68 70 (!) 59  Resp: 19 18 16 14   Temp: 98.2 F (36.8 C) 98.6 F (37 C) 98.7 F (37.1 C) 98.9 F (37.2 C)  TempSrc: Oral Oral Oral Oral  SpO2: 93% 94% 95% 96%  Weight:      Height:       Filed Weights   10/26/20 1649  Weight: 85.8 kg   Body mass index is 29.19 kg/m.   Gen Exam:  Awake Alert, No new F.N deficits, Normal affect [place].AT,PERRAL Supple Neck,No JVD, No cervical lymphadenopathy appriciated.  Symmetrical Chest wall movement, Good air movement bilaterally, CTAB RRR,No Gallops, Rubs or new Murmurs, No Parasternal Heave +ve B.Sounds, Abd Soft, No tenderness, No organomegaly appriciated, No rebound - guarding or rigidity. L arm and hand under bandage   I have personally reviewed following labs and imaging studies  LABORATORY DATA: CBC: Recent Labs  Lab 10/26/20 1756 10/27/20 0043 10/28/20 0038 10/29/20 0200 10/30/20 0530  WBC 10.2 8.8 8.9 8.6 8.4  NEUTROABS  --   --   --   --  5.0  HGB 8.9* 8.8* 8.5* 9.1* 9.3*  HCT 28.1* 28.2* 27.7* 30.0* 30.8*  MCV 89.5 90.7 91.4 93.5 91.9  PLT 492*  446* 511* 498* 545*    Basic Metabolic Panel: Recent Labs  Lab 10/26/20 1756 10/27/20 0043 10/28/20 0038 10/29/20 0200 10/30/20 0530  NA 138 137 139 139 137  K 3.0* 2.9* 3.9 4.2 4.4  CL 95* 96* 100 102 99  CO2 31 28 31 29 28   GLUCOSE 87 123* 90 99 93  BUN 9 9 14 16 15   CREATININE 0.87 0.94 0.97 0.87 0.87  CALCIUM 8.3* 8.3* 8.5* 8.7* 9.2  MG  --   --  1.7 2.1 2.0    GFR: Estimated Creatinine Clearance: 101.6 mL/min (by C-G formula based on SCr of 0.87 mg/dL).  Liver Function Tests: Recent Labs  Lab 10/26/20 1756 10/28/20 0038 10/30/20 0530  AST 15 11* 12*  ALT 7 7 6   ALKPHOS 95 100 99  BILITOT 0.6 0.6  0.7  PROT 6.1* 5.9* 6.5  ALBUMIN 2.4* 2.3* 2.6*   No results for input(s): LIPASE, AMYLASE in the last 168 hours. No results for input(s): AMMONIA in the last 168 hours.  Coagulation Profile: No results for input(s): INR, PROTIME in the last 168 hours.  Cardiac Enzymes: No results for input(s): CKTOTAL, CKMB, CKMBINDEX, TROPONINI in the last 168 hours.  BNP (last 3 results) No results for input(s): PROBNP in the last 8760 hours.  Lipid Profile: No results for input(s): CHOL, HDL, LDLCALC, TRIG, CHOLHDL, LDLDIRECT in the last 72 hours.  Thyroid Function Tests: No results for input(s): TSH, T4TOTAL, FREET4, T3FREE, THYROIDAB in the last 72 hours.  Anemia Panel: No results for input(s): VITAMINB12, FOLATE, FERRITIN, TIBC, IRON, RETICCTPCT in the last 72 hours.  Urine analysis:    Component Value Date/Time   COLORURINE AMBER (A) 07/13/2020 1320   APPEARANCEUR CLOUDY (A) 07/13/2020 1320   APPEARANCEUR Hazy 07/10/2013 1121   LABSPEC 1.028 07/13/2020 1320   LABSPEC 1.020 07/10/2013 1121   PHURINE 5.0 07/13/2020 1320   GLUCOSEU NEGATIVE 07/13/2020 1320   GLUCOSEU Negative 07/10/2013 1121   HGBUR SMALL (A) 07/13/2020 1320   BILIRUBINUR NEGATIVE 07/13/2020 1320   BILIRUBINUR Negative 07/10/2013 1121   KETONESUR 5 (A) 07/13/2020 1320   PROTEINUR 100 (A)  07/13/2020 1320   UROBILINOGEN 1.0 02/15/2015 0530   NITRITE POSITIVE (A) 07/13/2020 1320   LEUKOCYTESUR LARGE (A) 07/13/2020 1320   LEUKOCYTESUR Trace 07/10/2013 1121    Sepsis Labs: Lactic Acid, Venous    Component Value Date/Time   LATICACIDVEN 1.5 07/14/2020 0021    MICROBIOLOGY: Recent Results (from the past 240 hour(s))  Resp Panel by RT-PCR (Flu A&B, Covid) Nasopharyngeal Swab     Status: None   Collection Time: 10/26/20  7:05 PM   Specimen: Nasopharyngeal Swab; Nasopharyngeal(NP) swabs in vial transport medium  Result Value Ref Range Status   SARS Coronavirus 2 by RT PCR NEGATIVE NEGATIVE Final    Comment: (NOTE) SARS-CoV-2 target nucleic acids are NOT DETECTED.  The SARS-CoV-2 RNA is generally detectable in upper respiratory specimens during the acute phase of infection. The lowest concentration of SARS-CoV-2 viral copies this assay can detect is 138 copies/mL. A negative result does not preclude SARS-Cov-2 infection and should not be used as the sole basis for treatment or other patient management decisions. A negative result may occur with  improper specimen collection/handling, submission of specimen other than nasopharyngeal swab, presence of viral mutation(s) within the areas targeted by this assay, and inadequate number of viral copies(<138 copies/mL). A negative result must be combined with clinical observations, patient history, and epidemiological information. The expected result is Negative.  Fact Sheet for Patients:  EntrepreneurPulse.com.au  Fact Sheet for Healthcare Providers:  IncredibleEmployment.be  This test is no t yet approved or cleared by the [place] FDA and  has been authorized for detection and/or diagnosis of SARS-CoV-2 by FDA under an Emergency Use Authorization (EUA). This EUA will remain  in effect (meaning this test can be used) for the duration of the COVID-19 declaration under Section  564(b)(1) of the Act, 21 U.S.C.section 360bbb-3(b)(1), unless the authorization is terminated  or revoked sooner.       Influenza A by PCR NEGATIVE NEGATIVE Final   Influenza B by PCR NEGATIVE NEGATIVE Final    Comment: (NOTE) The Xpert Xpress SARS-CoV-2/FLU/RSV plus assay is intended as an aid in the diagnosis of influenza from Nasopharyngeal swab specimens and should not be used as a sole basis  for treatment. Nasal washings and aspirates are unacceptable for Xpert Xpress SARS-CoV-2/FLU/RSV testing.  Fact Sheet for Patients: EntrepreneurPulse.com.au  Fact Sheet for Healthcare Providers: IncredibleEmployment.be  This test is not yet approved or cleared by the [place] FDA and has been authorized for detection and/or diagnosis of SARS-CoV-2 by FDA under an Emergency Use Authorization (EUA). This EUA will remain in effect (meaning this test can be used) for the duration of the COVID-19 declaration under Section 564(b)(1) of the Act, 21 U.S.C. section 360bbb-3(b)(1), unless the authorization is terminated or revoked.  Performed at [facility], [place] [address] [place], [place] [address]   Surgical pcr screen     Status: Abnormal   Collection Time: 10/26/20  7:06 PM   Specimen: Nasal Mucosa; Nasal Swab  Result Value Ref Range Status   MRSA, PCR POSITIVE (A) NEGATIVE Final    Comment: RESULT CALLED TO, READ BACK BY AND VERIFIED WITH: A. SOUTZ RN, AT [phone] 10/27/20 D. VANHOOK    Staphylococcus aureus POSITIVE (A) NEGATIVE Final    Comment: (NOTE) The Xpert SA Assay (FDA approved for NASAL specimens in patients 62 years of age and older), is one component of a comprehensive surveillance program. It is not intended to diagnose infection nor to guide or monitor treatment. Performed at [facility], [place] [address], [place], [place] [address]   Aerobic/Anaerobic Culture w Gram Stain (surgical/deep wound)     Status: None  (Preliminary result)   Collection Time: 10/26/20  9:40 PM   Specimen: Abscess  Result Value Ref Range Status   Specimen Description ABSCESS  Final   Special Requests NONE  Final   Gram Stain   Final    MODERATE WBC PRESENT, PREDOMINANTLY PMN RARE GRAM POSITIVE COCCI IN CLUSTERS Performed at [facility], [place] [address] [place], [place] [address]    Culture   Final    RARE METHICILLIN RESISTANT STAPHYLOCOCCUS AUREUS CULTURE REINCUBATED FOR BETTER GROWTH NO ANAEROBES ISOLATED; CULTURE IN PROGRESS FOR 5 DAYS    Report Status PENDING  Incomplete   Organism ID, Bacteria METHICILLIN RESISTANT STAPHYLOCOCCUS AUREUS  Final      Susceptibility   Methicillin resistant staphylococcus aureus - MIC*    CIPROFLOXACIN >=8 RESISTANT Resistant     ERYTHROMYCIN >=8 RESISTANT Resistant     GENTAMICIN <=0.5 SENSITIVE Sensitive     OXACILLIN >=4 RESISTANT Resistant     TETRACYCLINE >=16 RESISTANT Resistant     VANCOMYCIN <=0.5 SENSITIVE Sensitive     TRIMETH/SULFA >=320 RESISTANT Resistant     CLINDAMYCIN <=0.25 SENSITIVE Sensitive     RIFAMPIN <=0.5 SENSITIVE Sensitive     Inducible Clindamycin NEGATIVE Sensitive     * RARE METHICILLIN RESISTANT STAPHYLOCOCCUS AUREUS  Aerobic/Anaerobic Culture w Gram Stain (surgical/deep wound)     Status: None (Preliminary result)   Collection Time: 10/26/20  9:44 PM   Specimen: PATH Other; Tissue  Result Value Ref Range Status   Specimen Description ABSCESS  Final   Special Requests LEFT  Final   Gram Stain   Final    ABUNDANT WBC PRESENT, PREDOMINANTLY PMN FEW GRAM POSITIVE COCCI IN CLUSTERS MODERATE GRAM POSITIVE RODS Performed at [facility], [address], [place], [place] [address]    Culture   Final    MODERATE METHICILLIN RESISTANT STAPHYLOCOCCUS AUREUS FEW EIKENELLA CORRODENS Usually susceptible to penicillin and other beta lactam agents,quinolones,macrolides and tetracyclines. NO ANAEROBES ISOLATED; CULTURE IN PROGRESS FOR 5  DAYS    Report  Status PENDING  Incomplete   Organism ID, Bacteria METHICILLIN RESISTANT STAPHYLOCOCCUS AUREUS  Final      Susceptibility   Methicillin resistant staphylococcus aureus - MIC*    CIPROFLOXACIN >=8 RESISTANT Resistant     ERYTHROMYCIN >=8 RESISTANT Resistant     GENTAMICIN <=0.5 SENSITIVE Sensitive     OXACILLIN >=4 RESISTANT Resistant     TETRACYCLINE >=16 RESISTANT Resistant     VANCOMYCIN <=0.5 SENSITIVE Sensitive     TRIMETH/SULFA 160 RESISTANT Resistant     CLINDAMYCIN <=0.25 SENSITIVE Sensitive     RIFAMPIN <=0.5 SENSITIVE Sensitive     Inducible Clindamycin NEGATIVE Sensitive     * MODERATE METHICILLIN RESISTANT STAPHYLOCOCCUS AUREUS  Culture, blood (routine x 2)     Status: None (Preliminary result)   Collection Time: 10/27/20  1:46 PM   Specimen: BLOOD  Result Value Ref Range Status   Specimen Description BLOOD RIGHT ANTECUBITAL  Final   Special Requests   Final    BOTTLES DRAWN AEROBIC AND ANAEROBIC Blood Culture adequate volume   Culture   Final    NO GROWTH 2 DAYS Performed at [facility], [address], [place], [place] [address]    Report Status PENDING  Incomplete  Culture, blood (routine x 2)     Status: None (Preliminary result)   Collection Time: 10/27/20  1:54 PM   Specimen: BLOOD RIGHT FOREARM  Result Value Ref Range Status   Specimen Description BLOOD RIGHT FOREARM  Final   Special Requests   Final    BOTTLES DRAWN AEROBIC ONLY Blood Culture adequate volume   Culture   Final    NO GROWTH 2 DAYS Performed at [facility], [place] [address] [place], [place] [address]    Report Status PENDING  Incomplete    RADIOLOGY STUDIES/RESULTS: No results found.   LOS: 4 days   Signature  Lala Lund M.D on 10/30/2020 at 9:31 AM   -  To page go to [email or web address]

## 2020-10-30 NOTE — Progress Notes (Signed)
  Echocardiogram 2D Echocardiogram has been performed.  Kelly [name] 10/30/2020, 9:42 AM

## 2020-10-30 NOTE — Progress Notes (Addendum)
Pharmacy Antibiotic Note  Kelly [name] is a 35 y.o. female admitted on 10/26/2020 with with hx of drug use and MSSA epidural abscess and tricuspid valve endocarditis in 2019, reinfection of tricuspid valve with Enterococcus in 2020 an reinfection this yr with MRSA tricuspid valve endocarditis with septic embolization to lung; pt was admitted on 10/26/2020 with multiple abscesses (L breast, R and L thighs, L wrist).  Pharmacy has been consulted for Vancomycin and Unasyn dosing. Scr is 0.87 which is above her baseline which appears to be around 0.4-0.5. Levels drawn appropriately show a VP of 43 and a VT 22 with a calculated AUC of 822 which is supratherapeutic.Current WBC 8.4 and patient remains afebrile. Today, cultures show few E. Corrodens and ID consulted pharmacy to add Unasyn.   Plan: Reduce to vancomycin 750 MG IV Q12H (pAUC 493) Start Unasyn 3 G IV Q8H  Continue to monitor renal function, dose adjustment may be necessary if improves  Monitor culture data and treatment plan   Height: 5' 7.5" (171.5 cm) Weight: 85.8 kg (189 lb 2.5 oz) IBW/kg (Calculated) : 62.75  Temp (24hrs), Avg:98.6 F (37 C), Min:98.2 F (36.8 C), Max:98.9 F (37.2 C)  Recent Labs  Lab 10/26/20 1756 10/27/20 0043 10/28/20 0038 10/29/20 0200 10/29/20 2123 10/30/20 0530  WBC 10.2 8.8 8.9 8.6  --  8.4  CREATININE 0.87 0.94 0.97 0.87  --  0.87  VANCOTROUGH  --   --   --   --   --  22*  VANCOPEAK  --   --   --   --  43*  --     Estimated Creatinine Clearance: 101.6 mL/min (by C-G formula based on SCr of 0.87 mg/dL).    Allergies  Allergen Reactions   Hydrocodone Itching   Morphine And Related Nausea And Vomiting    Antimicrobials this admission: 6/15 Vancomycin >> 6/19 Unasyn >>  Dose adjustments this admission: Vancomycin 1250 Q12H>> 750 Q12H   Microbiology results: 6/16 Surgical PCR >> MRSA positive 6/15 Wound Cx >> rare gram positive cocci in clusters, few Eikenella Corrodens  6/16 Bcx >>  ngtd  Thank you for allowing pharmacy to be a part of this patient's care.  Cephus Slater, PharmD, MBA Pharmacy Resident [phone] 10/30/2020 10:26 AM

## 2020-10-31 LAB — CBC WITH DIFFERENTIAL/PLATELET
Abs Immature Granulocytes: 0.11 10*3/uL — ABNORMAL HIGH (ref 0.00–0.07)
Basophils Absolute: 0.1 10*3/uL (ref 0.0–0.1)
Basophils Relative: 1 %
Eosinophils Absolute: 0.2 10*3/uL (ref 0.0–0.5)
Eosinophils Relative: 2 %
HCT: 30.7 % — ABNORMAL LOW (ref 36.0–46.0)
Hemoglobin: 9.3 g/dL — ABNORMAL LOW (ref 12.0–15.0)
Immature Granulocytes: 1 %
Lymphocytes Relative: 26 %
Lymphs Abs: 2.5 10*3/uL (ref 0.7–4.0)
MCH: 27.4 pg (ref 26.0–34.0)
MCHC: 30.3 g/dL (ref 30.0–36.0)
MCV: 90.6 fL (ref 80.0–100.0)
Monocytes Absolute: 0.5 10*3/uL (ref 0.1–1.0)
Monocytes Relative: 5 %
Neutro Abs: 6.1 10*3/uL (ref 1.7–7.7)
Neutrophils Relative %: 65 %
Platelets: 590 10*3/uL — ABNORMAL HIGH (ref 150–400)
RBC: 3.39 MIL/uL — ABNORMAL LOW (ref 3.87–5.11)
RDW: 15.3 % (ref 11.5–15.5)
WBC: 9.5 10*3/uL (ref 4.0–10.5)
nRBC: 0 % (ref 0.0–0.2)

## 2020-10-31 LAB — COMPREHENSIVE METABOLIC PANEL
ALT: 6 U/L (ref 0–44)
AST: 11 U/L — ABNORMAL LOW (ref 15–41)
Albumin: 2.5 g/dL — ABNORMAL LOW (ref 3.5–5.0)
Alkaline Phosphatase: 97 U/L (ref 38–126)
Anion gap: 11 (ref 5–15)
BUN: 18 mg/dL (ref 6–20)
CO2: 31 mmol/L (ref 22–32)
Calcium: 9.5 mg/dL (ref 8.9–10.3)
Chloride: 95 mmol/L — ABNORMAL LOW (ref 98–111)
Creatinine, Ser: 0.99 mg/dL (ref 0.44–1.00)
GFR, Estimated: >60 mL/min (ref >60)
Glucose, Bld: 105 mg/dL — ABNORMAL HIGH (ref 70–99)
Potassium: 4.3 mmol/L (ref 3.5–5.1)
Sodium: 137 mmol/L (ref 135–145)
Total Bilirubin: 0.6 mg/dL (ref 0.3–1.2)
Total Protein: 6.6 g/dL (ref 6.5–8.1)

## 2020-10-31 LAB — C-REACTIVE PROTEIN: CRP: 0.8 mg/dL (ref <1.0)

## 2020-10-31 LAB — PROCALCITONIN: Procalcitonin: <0.1 ng/mL

## 2020-10-31 LAB — MAGNESIUM: Magnesium: 2 mg/dL (ref 1.7–2.4)

## 2020-10-31 NOTE — Progress Notes (Signed)
    [facility] has been requested to perform a transesophageal echocardiogram on 11/01/20 for Bacteremia with Dr. Gardiner Rhyme.  After careful review of history and examination, the risks and benefits of transesophageal echocardiogram have been explained including risks of esophageal damage, perforation (1:10,000 risk), bleeding, pharyngeal hematoma as well as other potential complications associated with conscious sedation including aspiration, arrhythmia, respiratory failure and death. Alternatives to treatment were discussed, questions were answered. Patient is willing to proceed. Labs and vital signs stable.   Reino Bellis, NP-C 10/31/2020 4:57 PM

## 2020-10-31 NOTE — Progress Notes (Addendum)
Kelly [name] for Infectious Disease   Reason for visit: Follow up on abscesses  Interval History: now with two new areas on her right leg, no fever, WBC wnl.   Day 6 total antibiotics Day 6 vancomycin Day 2 amp/sulbactam  Physical Exam: Constitutional:  Vitals:   10/31/20 0437 10/31/20 0733  BP: (!) 138/100 (!) 128/102  Pulse: (!) 58 (!) 59  Resp: 16 20  Temp: 98 F (36.7 C) 98.5 F (36.9 C)  SpO2: 95% 96%   patient appears in NAD Respiratory: Normal respiratory effort; CTA B Cardiovascular: RRR MS: right leg with two areas with erythema, warmth, tenderness  Review of Systems: Constitutional: negative for fevers and chills Gastrointestinal: negative for nausea and diarrhea Hematologic/lymphatic: negative for lymphadenopathy  Lab Results  Component Value Date   WBC 9.5 10/31/2020   HGB 9.3 (L) 10/31/2020   HCT 30.7 (L) 10/31/2020   MCV 90.6 10/31/2020   PLT 590 (H) 10/31/2020    Lab Results  Component Value Date   CREATININE 0.99 10/31/2020   BUN 18 10/31/2020   NA 137 10/31/2020   K 4.3 10/31/2020   CL 95 (L) 10/31/2020   CO2 31 10/31/2020    Lab Results  Component Value Date   ALT 6 10/31/2020   AST 11 (L) 10/31/2020   ALKPHOS 97 10/31/2020     Microbiology: Recent Results (from the past 240 hour(s))  Resp Panel by RT-PCR (Flu A&B, Covid) Nasopharyngeal Swab     Status: None   Collection Time: 10/26/20  7:05 PM   Specimen: Nasopharyngeal Swab; Nasopharyngeal(NP) swabs in vial transport medium  Result Value Ref Range Status   SARS Coronavirus 2 by RT PCR NEGATIVE NEGATIVE Final    Comment: (NOTE) SARS-CoV-2 target nucleic acids are NOT DETECTED.  The SARS-CoV-2 RNA is generally detectable in upper respiratory specimens during the acute phase of infection. The lowest concentration of SARS-CoV-2 viral copies this assay can detect is 138 copies/mL. A negative result does not preclude SARS-Cov-2 infection and should not be used as the sole basis  for treatment or other patient management decisions. A negative result may occur with  improper specimen collection/handling, submission of specimen other than nasopharyngeal swab, presence of viral mutation(s) within the areas targeted by this assay, and inadequate number of viral copies(<138 copies/mL). A negative result must be combined with clinical observations, patient history, and epidemiological information. The expected result is Negative.  Fact Sheet for Patients:  EntrepreneurPulse.com.au  Fact Sheet for Healthcare Providers:  IncredibleEmployment.be  This test is no t yet approved or cleared by the [place] FDA and  has been authorized for detection and/or diagnosis of SARS-CoV-2 by FDA under an Emergency Use Authorization (EUA). This EUA will remain  in effect (meaning this test can be used) for the duration of the COVID-19 declaration under Section 564(b)(1) of the Act, 21 U.S.C.section 360bbb-3(b)(1), unless the authorization is terminated  or revoked sooner.       Influenza A by PCR NEGATIVE NEGATIVE Final   Influenza B by PCR NEGATIVE NEGATIVE Final    Comment: (NOTE) The Xpert Xpress SARS-CoV-2/FLU/RSV plus assay is intended as an aid in the diagnosis of influenza from Nasopharyngeal swab specimens and should not be used as a sole basis for treatment. Nasal washings and aspirates are unacceptable for Xpert Xpress SARS-CoV-2/FLU/RSV testing.  Fact Sheet for Patients: EntrepreneurPulse.com.au  Fact Sheet for Healthcare Providers: IncredibleEmployment.be  This test is not yet approved or cleared by the [place] and  has been authorized for detection and/or diagnosis of SARS-CoV-2 by FDA under an Emergency Use Authorization (EUA). This EUA will remain in effect (meaning this test can be used) for the duration of the COVID-19 declaration under Section 564(b)(1) of the Act, 21  U.S.C. section 360bbb-3(b)(1), unless the authorization is terminated or revoked.  Performed at [facility], [place] [address] [place], [place] [address]   Surgical pcr screen     Status: Abnormal   Collection Time: 10/26/20  7:06 PM   Specimen: Nasal Mucosa; Nasal Swab  Result Value Ref Range Status   MRSA, PCR POSITIVE (A) NEGATIVE Final    Comment: RESULT CALLED TO, READ BACK BY AND VERIFIED WITH: A. SOUTZ RN, AT [phone] 10/27/20 D. VANHOOK    Staphylococcus aureus POSITIVE (A) NEGATIVE Final    Comment: (NOTE) The Xpert SA Assay (FDA approved for NASAL specimens in patients 84 years of age and older), is one component of a comprehensive surveillance program. It is not intended to diagnose infection nor to guide or monitor treatment. Performed at [facility], [place] [address], [place], [place] [address]   Aerobic/Anaerobic Culture w Gram Stain (surgical/deep wound)     Status: None (Preliminary result)   Collection Time: 10/26/20  9:40 PM   Specimen: Abscess  Result Value Ref Range Status   Specimen Description ABSCESS  Final   Special Requests NONE  Final   Gram Stain   Final    MODERATE WBC PRESENT, PREDOMINANTLY PMN RARE GRAM POSITIVE COCCI IN CLUSTERS Performed at [facility], [place] [address], [place], [place] [address]    Culture   Final    RARE METHICILLIN RESISTANT STAPHYLOCOCCUS AUREUS RARE EIKENELLA CORRODENS Usually susceptible to penicillin and other beta lactam agents,quinolones,macrolides and tetracyclines. NO ANAEROBES ISOLATED; CULTURE IN PROGRESS FOR 5 DAYS    Report Status PENDING  Incomplete   Organism ID, Bacteria METHICILLIN RESISTANT STAPHYLOCOCCUS AUREUS  Final      Susceptibility   Methicillin resistant staphylococcus aureus - MIC*    CIPROFLOXACIN >=8 RESISTANT Resistant     ERYTHROMYCIN >=8 RESISTANT Resistant     GENTAMICIN <=0.5 SENSITIVE Sensitive     OXACILLIN >=4 RESISTANT Resistant     TETRACYCLINE >=16 RESISTANT Resistant      VANCOMYCIN <=0.5 SENSITIVE Sensitive     TRIMETH/SULFA >=320 RESISTANT Resistant     CLINDAMYCIN <=0.25 SENSITIVE Sensitive     RIFAMPIN <=0.5 SENSITIVE Sensitive     Inducible Clindamycin NEGATIVE Sensitive     * RARE METHICILLIN RESISTANT STAPHYLOCOCCUS AUREUS  Aerobic/Anaerobic Culture w Gram Stain (surgical/deep wound)     Status: None (Preliminary result)   Collection Time: 10/26/20  9:44 PM   Specimen: PATH Other; Tissue  Result Value Ref Range Status   Specimen Description ABSCESS  Final   Special Requests LEFT  Final   Gram Stain   Final    ABUNDANT WBC PRESENT, PREDOMINANTLY PMN FEW GRAM POSITIVE COCCI IN CLUSTERS MODERATE GRAM POSITIVE RODS Performed at [facility], [address] [place], [place] [address]    Culture   Final    MODERATE METHICILLIN RESISTANT STAPHYLOCOCCUS AUREUS FEW EIKENELLA CORRODENS Usually susceptible to penicillin and other beta lactam agents,quinolones,macrolides and tetracyclines. NO ANAEROBES ISOLATED; CULTURE IN PROGRESS FOR 5 DAYS    Report Status PENDING  Incomplete   Organism ID, Bacteria METHICILLIN RESISTANT STAPHYLOCOCCUS AUREUS  Final      Susceptibility   Methicillin resistant staphylococcus aureus - MIC*  CIPROFLOXACIN >=8 RESISTANT Resistant     ERYTHROMYCIN >=8 RESISTANT Resistant     GENTAMICIN <=0.5 SENSITIVE Sensitive     OXACILLIN >=4 RESISTANT Resistant     TETRACYCLINE >=16 RESISTANT Resistant     VANCOMYCIN <=0.5 SENSITIVE Sensitive     TRIMETH/SULFA 160 RESISTANT Resistant     CLINDAMYCIN <=0.25 SENSITIVE Sensitive     RIFAMPIN <=0.5 SENSITIVE Sensitive     Inducible Clindamycin NEGATIVE Sensitive     * MODERATE METHICILLIN RESISTANT STAPHYLOCOCCUS AUREUS  Culture, blood (routine x 2)     Status: None (Preliminary result)   Collection Time: 10/27/20  1:46 PM   Specimen: BLOOD  Result Value Ref Range Status   Specimen Description BLOOD RIGHT ANTECUBITAL  Final   Special Requests   Final    BOTTLES  DRAWN AEROBIC AND ANAEROBIC Blood Culture adequate volume   Culture   Final    NO GROWTH 4 DAYS Performed at [facility], [address] [place], [place] [address]    Report Status PENDING  Incomplete  Culture, blood (routine x 2)     Status: None (Preliminary result)   Collection Time: 10/27/20  1:54 PM   Specimen: BLOOD RIGHT FOREARM  Result Value Ref Range Status   Specimen Description BLOOD RIGHT FOREARM  Final   Special Requests   Final    BOTTLES DRAWN AEROBIC ONLY Blood Culture adequate volume   Culture   Final    NO GROWTH 4 DAYS Performed at [facility], [place] [address] [place], [place] [address]    Report Status PENDING  Incomplete    Impression/Plan:  1. Abscesses - growth with MRSA and Eikenella corrodens and now on amp/sulbactam along with vancomcyin.   With two new areas, likely will need further debridement.    2.  Emboli - two new areas on leg that most appear c/w embolic disease.  Will likely need I and D and I have requested a TEE with concern for infective endocarditis.   TEE scheduled for tomorrow  3.  IVDU - denies active use though likely has embolic disease with a mouth organism and MRSA in multiple sites c/w an embolic disease.  Checking TEE as above.

## 2020-10-31 NOTE — Evaluation (Signed)
Occupational Therapy Evaluation Patient Details Name: Kelly [name] MRN: [id number] DOB: 08-25-1985 Today's Date: 10/31/2020    History of Present Illness Pt is a 35 year old woman admitted on 10/26/20 with L hand, L breast and B thigh abscesses. Underwent I & D of all areas same day. PMH: IVDU, recurrent tricuspid valve endocardititis, lumbar abscess, Bipolar disorder, ADD, recent birth, Crohns disease, recent c-diff, CHF, HTN, moderate pulmonary edema.   Clinical Impression   Pt is functioning independently. Her chief concerns are her broken cell phone and she does not have any place to go upon discharge. She expresses desire to go to a drug rehab facility. No further OT needs.    Follow Up Recommendations  No OT follow up    Equipment Recommendations  None recommended by OT    Recommendations for Other Services       Precautions / Restrictions Precautions Precautions: None      Mobility Bed Mobility Overal bed mobility: Independent                  Transfers Overall transfer level: Modified independent Equipment used: None                  Balance                                           ADL either performed or assessed with clinical judgement   ADL Overall ADL's : Independent                                             Vision Baseline Vision/History: No visual deficits       Perception     Praxis      Pertinent Vitals/Pain Pain Assessment: Faces Faces Pain Scale: Hurts even more Pain Location: L breast, R thigh Pain Descriptors / Indicators: Discomfort Pain Intervention(s): Repositioned;Monitored during session     Hand Dominance Right   Extremity/Trunk Assessment Upper Extremity Assessment Upper Extremity Assessment: LUE deficits/detail LUE Deficits / Details: wrapped from DIPs to forearm LUE Coordination: decreased fine motor   Lower Extremity Assessment Lower Extremity Assessment:  Overall WFL for tasks assessed   Cervical / Trunk Assessment Cervical / Trunk Assessment: Normal   Communication Communication Communication: No difficulties   Cognition Arousal/Alertness: Awake/alert Behavior During Therapy: Flat affect Overall Cognitive Status: Within Functional Limits for tasks assessed                                     General Comments       Exercises     Shoulder Instructions      Home Living Family/patient expects to be discharged to:: Unsure                                 Additional Comments: pt reports she cannot go back to staying where she was prior to admission      Prior Functioning/Environment Level of Independence: Independent                 OT Problem List:        OT Treatment/Interventions:  OT Goals(Current goals can be found in the care plan section) Acute Rehab OT Goals Patient Stated Goal: to go into drug treatment facility  OT Frequency:     Barriers to D/C:            Co-evaluation              AM-PAC OT "6 Clicks" Daily Activity     Outcome Measure Help from another person eating meals?: None Help from another person taking care of personal grooming?: None Help from another person toileting, which includes using toliet, bedpan, or urinal?: None Help from another person bathing (including washing, rinsing, drying)?: None Help from another person to put on and taking off regular upper body clothing?: None Help from another person to put on and taking off regular lower body clothing?: None 6 Click Score: 24   End of Session    Activity Tolerance: Patient tolerated treatment well Patient left: in bed;with call bell/phone within reach  OT Visit Diagnosis: Pain                Time: 1735-6701 OT Time Calculation (min): 15 min Charges:  OT General Charges $OT Visit: 1 Visit OT Evaluation $OT Eval Low Complexity: 1 Low  Kelly [name], OTR/L Acute Rehabilitation  Services Pager: [phone] Office: [phone]   Kelly [name] 10/31/2020, 10:41 AM

## 2020-10-31 NOTE — TOC Progression Note (Signed)
Transition of Care [facility]) - Progression Note    Patient Details  Name: Kelly [name] MRN: [id number] Date of Birth: 01/30/1986  Transition [facility]) CM/SW [place], LCSW Phone Number: 10/31/2020, 4:50 PM  Clinical Narrative:    CSW spoke with patient. She requested assistance paying for a new phone. CSW explained the hospital does not have resources for that but that she could apply for a free phone through [place] with her Medicaid. CSW provided info. She requested to speak with Heart Failure CSW, Cortlin, as she assisted patient last time with her phone bill. CSW explained that she is not currently on the Heart Failure service so Cortlin is not able to assist her. Patient then requested assistance with going to a substance use treatment center at discharge. CSW provided her with community resources. CSW contacted [facility] and they are unable to accept patients on Methadone. Patient asked why CSW could not find a place for her and CSW explained that facilities would need to speak with her and do an assessment; CSW is able to fax a referral once patient has located a facility she is interested in. Patient asked if she could speak with someone else. CSW explained that CSW is there to assist her, but is unable to complete assessments for her. Patient appeared unhappy with CSW, but unfortunately resources are limited in the hospital setting.    Expected Discharge Plan: Home/Self Care Barriers to Discharge: Continued Medical Work up  Expected Discharge Plan and Services Expected Discharge Plan: Home/Self Care In-house Referral: Clinical Social Work Discharge Planning Services: CM Consult   Living arrangements for the past 2 months: [place] Determinants of Health (SDOH) Interventions    Readmission Risk Interventions Readmission Risk Prevention Plan 08/25/2020 07/30/2018  Transportation Screening  Complete Complete  Transportation Screening Comment Rooms at the [facility] to provide transportation for the patient. -  PCP or Specialist Appt within 5-7 Days Complete -  Not Complete comments 08/29/20 OB/GYN appointment scheduled -  Home Care Screening Complete -  Medication Review (RN Care Manager) - Complete  PCP or Specialist appointment within 3-5 days of discharge - Complete  HRI or [place] - Not Complete  HRI or Home Care Consult Pt Refusal Comments - N/A  SW Recovery Care/Counseling Consult - Complete  Palliative Care Screening - Not Complete  Comments - N/A  Skilled Nursing Facility - Not Complete  SNF Comments - N/A  Some recent data might be hidden

## 2020-10-31 NOTE — Progress Notes (Signed)
5 Days Post-Op  Subjective: CC: New right thigh abscesses x2 just came up over the weekend.  Other wounds stable.  Objective: Vital signs in last 24 hours: Temp:  [97.6 F (36.4 C)-99.4 F (37.4 C)] 98.7 F (37.1 C) (06/20 1245) Pulse Rate:  [58-70] 70 (06/20 1245) Resp:  [16-20] 20 (06/20 1245) BP: (124-138)/(86-102) 131/102 (06/20 1245) SpO2:  [94 %-96 %] 94 % (06/20 1245) Last BM Date: 10/30/20  Intake/Output from previous day: 06/19 0701 - 06/20 0700 In: 940 [P.O.:840; IV Piggyback:100] Out: -  Intake/Output this shift: No intake/output data recorded.  PE: Skin: Wounds - L breast: not looked at per patient's request - R thigh: Appears stable. No signs of surrounding erythema or induration to suggest cellulitis. No fluctuance. No purulent drainage or necrosis.  - L thigh: Appears stable. No signs of surrounding erythema or induration to suggest cellulitis. No fluctuance. No purulent drainage or necrosis.  Right thigh: 2 new areas of erythema and central fluctuance on lateral central thigh.  Lab Results:  Recent Labs    10/30/20 0530 10/31/20 0658  WBC 8.4 9.5  HGB 9.3* 9.3*  HCT 30.8* 30.7*  PLT 545* 590*   BMET Recent Labs    10/30/20 0530 10/31/20 0658  NA 137 137  K 4.4 4.3  CL 99 95*  CO2 28 31  GLUCOSE 93 105*  BUN 15 18  CREATININE 0.87 0.99  CALCIUM 9.2 9.5   PT/INR No results for input(s): LABPROT, INR in the last 72 hours. CMP     Component Value Date/Time   NA 137 10/31/2020 0658   NA 136 07/02/2013 0547   K 4.3 10/31/2020 0658   K 3.7 07/02/2013 0547   CL 95 (L) 10/31/2020 0658   CL 102 07/02/2013 0547   CO2 31 10/31/2020 0658   CO2 30 07/02/2013 0547   GLUCOSE 105 (H) 10/31/2020 0658   GLUCOSE 86 07/28/2020 0626   BUN 18 10/31/2020 0658   BUN 7 07/02/2013 0547   CREATININE 0.99 10/31/2020 0658   CREATININE 0.82 07/02/2013 0547   CALCIUM 9.5 10/31/2020 0658   CALCIUM 8.8 07/02/2013 0547   PROT 6.6 10/31/2020 0658   PROT  7.7 07/10/2013 1121   ALBUMIN 2.5 (L) 10/31/2020 0658   ALBUMIN 3.4 07/10/2013 1121   AST 11 (L) 10/31/2020 0658   AST 115 (H) 07/10/2013 1121   ALT 6 10/31/2020 0658   ALT 95 (H) 07/10/2013 1121   ALKPHOS 97 10/31/2020 0658   ALKPHOS 120 (H) 07/10/2013 1121   BILITOT 0.6 10/31/2020 0658   BILITOT 1.9 (H) 07/10/2013 1121   GFRNONAA >60 10/31/2020 0658   GFRNONAA >60 07/02/2013 0547   GFRAA >60 06/26/2019 1812   GFRAA >60 07/02/2013 0547   Lipase     Component Value Date/Time   LIPASE 19 07/13/2020 1035   LIPASE 80 06/29/2013 0454       Studies/Results: ECHOCARDIOGRAM LIMITED  Result Date: 10/30/2020    ECHOCARDIOGRAM LIMITED REPORT   Patient Name:   Kelly [name] Date of Exam: 10/30/2020 Medical Rec #:  [id number]          Height:       67.5 in Accession #:    [id number]         Weight:       189.2 lb Date of Birth:  1985/12/30           BSA:          1.986 m Patient Age:  35 years           BP:           136/98 mmHg Patient Gender: F                  HR:           64 bpm. Exam Location:  Inpatient Procedure: Limited Echo, Limited Color Doppler and Cardiac Doppler STAT ECHO Indications:    endocarditis  History:        Patient has prior history of Echocardiogram examinations, most                 recent 09/22/2020. Multiple abscess; Risk Factors:IV drug use.  Sonographer:    Johny Chess Referring Phys: [place]  1. Left ventricular ejection fraction, by estimation, is 40 to 45%. The left ventricle has mildly decreased function. The left ventricle demonstrates global hypokinesis.  2. Ventricular septum is flattened in diastole consistent with RV volume overload. . Right ventricular systolic function is moderately reduced. The right ventricular size is severely enlarged. There is mildly elevated pulmonary artery systolic pressure.  3. The mitral valve is normal in structure. No evidence of mitral valve regurgitation. No evidence of mitral stenosis.  4.  Tricuspid valve regurgitation is severe.  5. The aortic valve is tricuspid. No aortic stenosis is present.  6. The inferior vena cava is dilated in size with <50% respiratory variability, suggesting right atrial pressure of 15 mmHg.  7. Limited echo to evaluate for endocarditis. No visual evidence of current endocarditis FINDINGS  Left Ventricle: Left ventricular ejection fraction, by estimation, is 40 to 45%. The left ventricle has mildly decreased function. The left ventricle demonstrates global hypokinesis. The left ventricular internal cavity size was normal in size. There is  no left ventricular hypertrophy. Right Ventricle: Ventricular septum is flattened in diastole consistent with RV volume overload. The right ventricular size is severely enlarged. Right ventricular systolic function is moderately reduced. There is mildly elevated pulmonary artery systolic pressure. The tricuspid regurgitant velocity is 2.65 m/s, and with an assumed right atrial pressure of 15 mmHg, the estimated right ventricular systolic pressure is 01.7 mmHg. Pericardium: There is no evidence of pericardial effusion. Mitral Valve: The mitral valve is normal in structure. No evidence of mitral valve stenosis. Tricuspid Valve: Tricuspid valve regurgitation is severe. No evidence of tricuspid stenosis. Aortic Valve: The aortic valve is tricuspid. No aortic stenosis is present. Pulmonic Valve: The pulmonic valve was not well visualized. Pulmonic valve regurgitation is not visualized. No evidence of pulmonic stenosis. Pulmonary Artery: 28. Venous: There is systolic hepatic vein flow reversal consistent with severe TR. The inferior vena cava is dilated in size with less than 50% respiratory variability, suggesting right atrial pressure of 15 mmHg. LEFT VENTRICLE PLAX 2D LVIDd:         4.20 cm Diastology LVIDs:         3.50 cm LV e' medial:    8.81 cm/s LV PW:         1.10 cm LV E/e' medial:  6.2 LV IVS:        1.00 cm LV e' lateral:   12.60  cm/s                        LV E/e' lateral: 4.3  RIGHT VENTRICLE          IVC RV Basal diam:  5.50 cm  IVC diam: 2.40 cm RV  Mid diam:    4.50 cm LEFT ATRIUM         Index LA diam:    3.20 cm 1.61 cm/m  AORTIC VALVE LVOT Vmax:   78.40 cm/s LVOT Vmean:  50.100 cm/s LVOT VTI:    0.161 m MITRAL VALVE               TRICUSPID VALVE MV Area (PHT): 2.76 cm    TR Peak grad:   28.1 mmHg MV Decel Time: 275 msec    TR Vmax:        265.00 cm/s MV E velocity: 54.40 cm/s MV A velocity: 58.70 cm/s  SHUNTS MV E/A ratio:  0.93        Systemic VTI: 0.16 m Carlyle Dolly MD Electronically signed by Carlyle Dolly MD Signature Date/Time: 10/30/2020/10:21:30 AM    Final     Anti-infectives: Anti-infectives (From admission, onward)    Start     Dose/Rate Route Frequency Ordered Stop   10/30/20 1800  vancomycin (VANCOCIN) IVPB 750 mg/150 ml premix        750 mg 150 mL/hr over 60 Minutes Intravenous Every 12 hours 10/30/20 1018     10/30/20 0930  Ampicillin-Sulbactam (UNASYN) 3 g in sodium chloride 0.9 % 100 mL IVPB        3 g 200 mL/hr over 30 Minutes Intravenous Every 8 hours 10/30/20 0838     10/27/20 0530  Vancomycin (VANCOCIN) 1,250 mg in sodium chloride 0.9 % 250 mL IVPB  Status:  Discontinued        1,250 mg 166.7 mL/hr over 90 Minutes Intravenous Every 12 hours 10/26/20 1932 10/30/20 1018   10/26/20 1730  vancomycin (VANCOREADY) IVPB 1750 mg/350 mL        1,750 mg 175 mL/hr over 120 Minutes Intravenous  Once 10/26/20 1712 10/26/20 1946        Assessment/Plan POD 6 S/P I&D bilateral upper thigh abscesses, L breast abscess, CW 6/15 for Multiple soft tissue abscesses Right thigh abscesses - BID dressing changes. Wet to dry on left breast with minimal superficial packing. Dry gauze on b/l thighs - multimodal pain control in addition to methadone. - continue IV Abx per ID. Follow cultures - currently S. Aureus  - Wounds stable.  -new lesions on right thigh.  NPO p MN as patient would prefer to have these  drained in the OR and not at the bedside    FEN: regular, NPO p MN ID: vancomycin 6/15 >>  Unasyn 6/19  -> VTE: SCDs, Lovenox   L hand abscess - s/p I&D by Dr. Apolonio Schneiders - per Hand Surgery  Per medicine IVDA Endocarditis Heart failure HTN HCV syphilis   LOS: 5 days    Henreitta Cea , [facility] 10/31/2020, 1:25 PM Please see Amion for pager number during day hours 7:00am-4:30pm

## 2020-10-31 NOTE — Progress Notes (Signed)
PROGRESS NOTE        PATIENT DETAILS Name: Kelly [name] Age: 35 y.o. Sex: female Date of Birth: 31-Jan-1986 Admit Date: 10/26/2020 Admitting Physician Lequita Halt, MD PCP:[facility], [facility]  Brief Narrative: Patient is a 35 y.o. 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  She has a history of IV heroin/cocaine use-but claims she has been clean for several months.  UDS negative.  Significant events: 6/15>> admit for left breast/right thigh/left hand abscess.  Significant studies: 5/12>> TTE: EF 25-42%, RV systolic function moderately reduced.  Severe TR. 6/15>> CXR: No pneumonia 6/15>> x-ray left hand: No fracture/dislocation-dorsal soft tissue swelling  Antimicrobial therapy: Vancomycin: 6/15>>  Microbiology data: 6/15>> COVID/influenza PCR: Negative 6/15>> abscess cultures: Staff aureus (sensitivities pending) 6/16>> blood cultures: No growth  Procedures : 6/15>> I&D of left breast/right thigh/left thigh/left hand abscess by general surgery and orthopedics.  Consults: General surgery, hand surgery  DVT Prophylaxis : enoxaparin (LOVENOX) injection 40 mg Start: 10/28/20 1630 SCD's Start: 10/27/20 0030   Subjective:  Patient in bed appears to be in no distress denies any headache chest or abdominal pain, mild left arm pain ongoing and now is developing some pain in her right thigh.  Assessment/Plan:  Left breast/right/left thigh and left hand abscess, now developing right thigh abscess: S/p I&D by general surgery/hand surgery-wound cultures positive for staph aureus-remains on IV Vancomycin and Unasyn.  ID also following.  Procalcitonin and blood cultures so far negative , unfortunately on 10/31/2020 she has developed 2 new lesions on her right thigh.  Discussed with ID, TEE, general surgery to do  incision and drainage if possible.  History of recurrent tricuspid valve endocarditis (MSSA 2019, Enterococcus 2020, MRSA 2022): Concerned that she may have underlying endocarditis given her presentation-unfortunately blood cultures were done after patient received antibiotics-ID is following and directing antibiotics.  Repeat echocardiogram results pending.  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 difficile colitis-denies any diarrhea  History of syphilis: Stable for further treatment by ID MD outpatient setting  Recent vaginal birth/delivery: She is not breast-feeding-claims that her 1 month child is  with family members.  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  Diet: Diet Order             Diet regular Room service appropriate? Yes; Fluid consistency: Thin  Diet effective now                    Code Status: Full code   Family Communication: None at bedside  Disposition Plan: Status is: Inpatient  Remains inpatient appropriate because:Inpatient level of care appropriate due to severity of illness  Dispo: The patient is from: Home              Anticipated d/c is to: Home              Patient currently is not medically stable to d/c.   Difficult to place patient No   Barriers to Discharge: Multiple abscesses-needs ongoing IV antibiotics-awaiting culture data.  Antimicrobial agents: Anti-infectives (From admission, onward)    Start     Dose/Rate Route Frequency Ordered Stop   10/30/20 1800  vancomycin (VANCOCIN) IVPB 750 mg/150 ml premix        750 mg 150 mL/hr over 60 Minutes Intravenous Every 12 hours 10/30/20 1018     10/30/20 0930  Ampicillin-Sulbactam (UNASYN) 3 g in sodium chloride 0.9 % 100 mL IVPB        3 g 200 mL/hr over 30 Minutes Intravenous Every 8 hours 10/30/20 0838     10/27/20 0530  Vancomycin (VANCOCIN) 1,250 mg in sodium chloride  0.9 % 250 mL IVPB  Status:  Discontinued        1,250 mg 166.7 mL/hr over 90 Minutes Intravenous Every 12 hours 10/26/20 1932 10/30/20 1018   10/26/20 1730  vancomycin (VANCOREADY) IVPB 1750 mg/350 mL        1,750 mg 175 mL/hr over 120 Minutes Intravenous  Once 10/26/20 1712 10/26/20 1946        Time spent: 25 minutes-Greater than 50% of this time was spent in counseling, explanation of diagnosis, planning of further management, and coordination of care.  MEDICATIONS: Scheduled Meds:  acidophilus  2 capsule Oral TID   Chlorhexidine Gluconate Cloth  6 each Topical Q0600   enoxaparin (LOVENOX) injection  40 mg Subcutaneous Q24H   furosemide  40 mg Oral Daily   methadone  130 mg Oral Daily   mupirocin ointment  1 application Nasal BID   NIFEdipine  30 mg Oral Daily   prenatal vitamin w/FE, FA  1 tablet Oral Q1200   sertraline  50 mg Oral Daily   Continuous Infusions:  ampicillin-sulbactam (UNASYN) IV 3 g (10/31/20 1042)   vancomycin 750 mg (10/31/20 0550)   PRN Meds:.acetaminophen, HYDROmorphone (DILAUDID) injection, ketorolac, methocarbamol   PHYSICAL EXAM: Vital signs: Vitals:   10/30/20 2050 10/31/20 0018 10/31/20 0437 10/31/20 0733  BP: 127/88 128/86 (!) 138/100 (!) 128/102  Pulse: 62 60 (!) 58 (!) 59  Resp: 18 17 16 20   Temp: 97.6 F (36.4 C) 97.7 F (36.5 C) 98 F (36.7 C) 98.5 F (36.9 C)  TempSrc: Oral Oral Oral Oral  SpO2: 94% 95% 95% 96%  Weight:      Height:       Filed Weights   10/26/20 1649  Weight: 85.8 kg   Body mass index is 29.19 kg/m.   Gen Exam:  Awake Alert, No new F.N deficits, Normal affect [place].AT,PERRAL Supple Neck,No JVD, No cervical lymphadenopathy appriciated.  Symmetrical Chest wall movement, Good air movement bilaterally, CTAB RRR,No Gallops, Rubs or new Murmurs, No Parasternal Heave +ve B.Sounds, Abd Soft, No tenderness, No organomegaly appriciated, No rebound - guarding or rigidity. L arm and hand under bandage, right thigh  on the lateral aspect has 2 tender subcutaneous lesions each about 1-1/2 cm in diameter   I have personally reviewed following labs and imaging studies  LABORATORY DATA: CBC: Recent Labs  Lab 10/27/20 0043 10/28/20 0038 10/29/20 0200 10/30/20 0530 10/31/20 0658  WBC 8.8 8.9 8.6 8.4 9.5  NEUTROABS  --   --   --  5.0 6.1  HGB 8.8* 8.5* 9.1* 9.3* 9.3*  HCT 28.2* 27.7* 30.0* 30.8* 30.7*  MCV 90.7 91.4 93.5 91.9 90.6  PLT 446* 511* 498* 545* 590*    Basic Metabolic Panel: Recent Labs  Lab 10/27/20 0043 10/28/20 0038 10/29/20 0200 10/30/20 0530 10/31/20 0658  NA 137 139 139 137 137  K 2.9* 3.9 4.2 4.4 4.3  CL 96* 100 102 99 95*  CO2 28 31 29 28 31   GLUCOSE 123* 90 99 93 105*  BUN  9 14 16 15 18   CREATININE 0.94 0.97 0.87 0.87 0.99  CALCIUM 8.3* 8.5* 8.7* 9.2 9.5  MG  --  1.7 2.1 2.0 2.0    GFR: Estimated Creatinine Clearance: 89.3 mL/min (by C-G formula based on SCr of 0.99 mg/dL).  Liver Function Tests: Recent Labs  Lab 10/26/20 1756 10/28/20 0038 10/30/20 0530 10/31/20 0658  AST 15 11* 12* 11*  ALT 7 7 6 6   ALKPHOS 95 100 99 97  BILITOT 0.6 0.6 0.7 0.6  PROT 6.1* 5.9* 6.5 6.6  ALBUMIN 2.4* 2.3* 2.6* 2.5*   No results for input(s): LIPASE, AMYLASE in the last 168 hours. No results for input(s): AMMONIA in the last 168 hours.  Coagulation Profile: No results for input(s): INR, PROTIME in the last 168 hours.  Cardiac Enzymes: No results for input(s): CKTOTAL, CKMB, CKMBINDEX, TROPONINI in the last 168 hours.  BNP (last 3 results) No results for input(s): PROBNP in the last 8760 hours.  Lipid Profile: No results for input(s): CHOL, HDL, LDLCALC, TRIG, CHOLHDL, LDLDIRECT in the last 72 hours.  Thyroid Function Tests: No results for input(s): TSH, T4TOTAL, FREET4, T3FREE, THYROIDAB in the last 72 hours.  Anemia Panel: No results for input(s): VITAMINB12, FOLATE, FERRITIN, TIBC, IRON, RETICCTPCT in the last 72 hours.  Urine analysis:    Component  Value Date/Time   COLORURINE AMBER (A) 07/13/2020 1320   APPEARANCEUR CLOUDY (A) 07/13/2020 1320   APPEARANCEUR Hazy 07/10/2013 1121   LABSPEC 1.028 07/13/2020 1320   LABSPEC 1.020 07/10/2013 1121   PHURINE 5.0 07/13/2020 1320   GLUCOSEU NEGATIVE 07/13/2020 1320   GLUCOSEU Negative 07/10/2013 1121   HGBUR SMALL (A) 07/13/2020 1320   BILIRUBINUR NEGATIVE 07/13/2020 1320   BILIRUBINUR Negative 07/10/2013 1121   KETONESUR 5 (A) 07/13/2020 1320   PROTEINUR 100 (A) 07/13/2020 1320   UROBILINOGEN 1.0 02/15/2015 0530   NITRITE POSITIVE (A) 07/13/2020 1320   LEUKOCYTESUR LARGE (A) 07/13/2020 1320   LEUKOCYTESUR Trace 07/10/2013 1121    Sepsis Labs: Lactic Acid, Venous    Component Value Date/Time   LATICACIDVEN 1.5 07/14/2020 0021    MICROBIOLOGY: Recent Results (from the past 240 hour(s))  Resp Panel by RT-PCR (Flu A&B, Covid) Nasopharyngeal Swab     Status: None   Collection Time: 10/26/20  7:05 PM   Specimen: Nasopharyngeal Swab; Nasopharyngeal(NP) swabs in vial transport medium  Result Value Ref Range Status   SARS Coronavirus 2 by RT PCR NEGATIVE NEGATIVE Final    Comment: (NOTE) SARS-CoV-2 target nucleic acids are NOT DETECTED.  The SARS-CoV-2 RNA is generally detectable in upper respiratory specimens during the acute phase of infection. The lowest concentration of SARS-CoV-2 viral copies this assay can detect is 138 copies/mL. A negative result does not preclude SARS-Cov-2 infection and should not be used as the sole basis for treatment or other patient management decisions. A negative result may occur with  improper specimen collection/handling, submission of specimen other than nasopharyngeal swab, presence of viral mutation(s) within the areas targeted by this assay, and inadequate number of viral copies(<138 copies/mL). A negative result must be combined with clinical observations, patient history, and epidemiological information. The expected result is  Negative.  Fact Sheet for Patients:  EntrepreneurPulse.com.au  Fact Sheet for Healthcare Providers:  IncredibleEmployment.be  This test is no t yet approved or cleared by the [place] FDA and  has been authorized for detection and/or diagnosis of SARS-CoV-2 by FDA under an Emergency Use Authorization (EUA). This EUA will remain  in effect (  meaning this test can be used) for the duration of the COVID-19 declaration under Section 564(b)(1) of the Act, 21 U.S.C.section 360bbb-3(b)(1), unless the authorization is terminated  or revoked sooner.       Influenza A by PCR NEGATIVE NEGATIVE Final   Influenza B by PCR NEGATIVE NEGATIVE Final    Comment: (NOTE) The Xpert Xpress SARS-CoV-2/FLU/RSV plus assay is intended as an aid in the diagnosis of influenza from Nasopharyngeal swab specimens and should not be used as a sole basis for treatment. Nasal washings and aspirates are unacceptable for Xpert Xpress SARS-CoV-2/FLU/RSV testing.  Fact Sheet for Patients: EntrepreneurPulse.com.au  Fact Sheet for Healthcare Providers: IncredibleEmployment.be  This test is not yet approved or cleared by the [place] FDA and has been authorized for detection and/or diagnosis of SARS-CoV-2 by FDA under an Emergency Use Authorization (EUA). This EUA will remain in effect (meaning this test can be used) for the duration of the COVID-19 declaration under Section 564(b)(1) of the Act, 21 U.S.C. section 360bbb-3(b)(1), unless the authorization is terminated or revoked.  Performed at [facility], [place] [address] [place], [place] [address]   Surgical pcr screen     Status: Abnormal   Collection Time: 10/26/20  7:06 PM   Specimen: Nasal Mucosa; Nasal Swab  Result Value Ref Range Status   MRSA, PCR POSITIVE (A) NEGATIVE Final    Comment: RESULT CALLED TO, READ BACK BY AND VERIFIED WITH: A. SOUTZ RN, AT [phone] 10/27/20  D. VANHOOK    Staphylococcus aureus POSITIVE (A) NEGATIVE Final    Comment: (NOTE) The Xpert SA Assay (FDA approved for NASAL specimens in patients 23 years of age and older), is one component of a comprehensive surveillance program. It is not intended to diagnose infection nor to guide or monitor treatment. Performed at [facility], [place] [address] [place], [place] [address]   Aerobic/Anaerobic Culture w Gram Stain (surgical/deep wound)     Status: None (Preliminary result)   Collection Time: 10/26/20  9:40 PM   Specimen: Abscess  Result Value Ref Range Status   Specimen Description ABSCESS  Final   Special Requests NONE  Final   Gram Stain   Final    MODERATE WBC PRESENT, PREDOMINANTLY PMN RARE GRAM POSITIVE COCCI IN CLUSTERS Performed at [facility], [place] [address], [place],  [address]    Culture   Final    RARE METHICILLIN RESISTANT STAPHYLOCOCCUS AUREUS RARE EIKENELLA CORRODENS Usually susceptible to penicillin and other beta lactam agents,quinolones,macrolides and tetracyclines. NO ANAEROBES ISOLATED; CULTURE IN PROGRESS FOR 5 DAYS    Report Status PENDING  Incomplete   Organism ID, Bacteria METHICILLIN RESISTANT STAPHYLOCOCCUS AUREUS  Final      Susceptibility   Methicillin resistant staphylococcus aureus - MIC*    CIPROFLOXACIN >=8 RESISTANT Resistant     ERYTHROMYCIN >=8 RESISTANT Resistant     GENTAMICIN <=0.5 SENSITIVE Sensitive     OXACILLIN >=4 RESISTANT Resistant     TETRACYCLINE >=16 RESISTANT Resistant     VANCOMYCIN <=0.5 SENSITIVE Sensitive     TRIMETH/SULFA >=320 RESISTANT Resistant     CLINDAMYCIN <=0.25 SENSITIVE Sensitive     RIFAMPIN <=0.5 SENSITIVE Sensitive     Inducible Clindamycin NEGATIVE Sensitive     * RARE METHICILLIN RESISTANT STAPHYLOCOCCUS AUREUS  Aerobic/Anaerobic Culture w Gram Stain (surgical/deep wound)     Status: None (Preliminary result)   Collection Time: 10/26/20  9:44 PM   Specimen: PATH Other; Tissue   Result Value Ref  Range Status   Specimen Description ABSCESS  Final   Special Requests LEFT  Final   Gram Stain   Final    ABUNDANT WBC PRESENT, PREDOMINANTLY PMN FEW GRAM POSITIVE COCCI IN CLUSTERS MODERATE GRAM POSITIVE RODS Performed at [facility], [place] [address], [place], [place] [address]    Culture   Final    MODERATE METHICILLIN RESISTANT STAPHYLOCOCCUS AUREUS FEW EIKENELLA CORRODENS Usually susceptible to penicillin and other beta lactam agents,quinolones,macrolides and tetracyclines. NO ANAEROBES ISOLATED; CULTURE IN PROGRESS FOR 5 DAYS    Report Status PENDING  Incomplete   Organism ID, Bacteria METHICILLIN RESISTANT STAPHYLOCOCCUS AUREUS  Final      Susceptibility   Methicillin resistant staphylococcus aureus - MIC*    CIPROFLOXACIN >=8 RESISTANT Resistant     ERYTHROMYCIN >=8 RESISTANT Resistant     GENTAMICIN <=0.5 SENSITIVE Sensitive     OXACILLIN >=4 RESISTANT Resistant     TETRACYCLINE >=16 RESISTANT Resistant     VANCOMYCIN <=0.5 SENSITIVE Sensitive     TRIMETH/SULFA 160 RESISTANT Resistant     CLINDAMYCIN <=0.25 SENSITIVE Sensitive     RIFAMPIN <=0.5 SENSITIVE Sensitive     Inducible Clindamycin NEGATIVE Sensitive     * MODERATE METHICILLIN RESISTANT STAPHYLOCOCCUS AUREUS  Culture, blood (routine x 2)     Status: None (Preliminary result)   Collection Time: 10/27/20  1:46 PM   Specimen: BLOOD  Result Value Ref Range Status   Specimen Description BLOOD RIGHT ANTECUBITAL  Final   Special Requests   Final    BOTTLES DRAWN AEROBIC AND ANAEROBIC Blood Culture adequate volume   Culture   Final    NO GROWTH 4 DAYS Performed at [facility], [address], [place], [place] [address]    Report Status PENDING  Incomplete  Culture, blood (routine x 2)     Status: None (Preliminary result)   Collection Time: 10/27/20  1:54 PM   Specimen: BLOOD RIGHT FOREARM  Result Value Ref Range Status   Specimen Description BLOOD RIGHT FOREARM  Final   Special  Requests   Final    BOTTLES DRAWN AEROBIC ONLY Blood Culture adequate volume   Culture   Final    NO GROWTH 4 DAYS Performed at [facility], [place] [address] [place], [place] [address]    Report Status PENDING  Incomplete    RADIOLOGY STUDIES/RESULTS: ECHOCARDIOGRAM LIMITED  Result Date: 10/30/2020    ECHOCARDIOGRAM LIMITED REPORT   Patient Name:   ILYANNA BAILLARGEON Date of Exam: 10/30/2020 Medical Rec #:  [id number]          Height:       67.5 in Accession #:    [id number]         Weight:       189.2 lb Date of Birth:  12-Jun-1985           BSA:          1.986 m Patient Age:    11 years           BP:           136/98 mmHg Patient Gender: F                  HR:           64 bpm. Exam Location:  Inpatient Procedure: Limited Echo, Limited Color Doppler and Cardiac Doppler STAT ECHO Indications:    endocarditis  History:        Patient  has prior history of Echocardiogram examinations, most                 recent 09/22/2020. Multiple abscess; Risk Factors:IV drug use.  Sonographer:    Johny Chess Referring Phys: [place]  1. Left ventricular ejection fraction, by estimation, is 40 to 45%. The left ventricle has mildly decreased function. The left ventricle demonstrates global hypokinesis.  2. Ventricular septum is flattened in diastole consistent with RV volume overload. . Right ventricular systolic function is moderately reduced. The right ventricular size is severely enlarged. There is mildly elevated pulmonary artery systolic pressure.  3. The mitral valve is normal in structure. No evidence of mitral valve regurgitation. No evidence of mitral stenosis.  4. Tricuspid valve regurgitation is severe.  5. The aortic valve is tricuspid. No aortic stenosis is present.  6. The inferior vena cava is dilated in size with <50% respiratory variability, suggesting right atrial pressure of 15 mmHg.  7. Limited echo to evaluate for endocarditis. No visual evidence of current endocarditis  FINDINGS  Left Ventricle: Left ventricular ejection fraction, by estimation, is 40 to 45%. The left ventricle has mildly decreased function. The left ventricle demonstrates global hypokinesis. The left ventricular internal cavity size was normal in size. There is  no left ventricular hypertrophy. Right Ventricle: Ventricular septum is flattened in diastole consistent with RV volume overload. The right ventricular size is severely enlarged. Right ventricular systolic function is moderately reduced. There is mildly elevated pulmonary artery systolic pressure. The tricuspid regurgitant velocity is 2.65 m/s, and with an assumed right atrial pressure of 15 mmHg, the estimated right ventricular systolic pressure is 33.2 mmHg. Pericardium: There is no evidence of pericardial effusion. Mitral Valve: The mitral valve is normal in structure. No evidence of mitral valve stenosis. Tricuspid Valve: Tricuspid valve regurgitation is severe. No evidence of tricuspid stenosis. Aortic Valve: The aortic valve is tricuspid. No aortic stenosis is present. Pulmonic Valve: The pulmonic valve was not well visualized. Pulmonic valve regurgitation is not visualized. No evidence of pulmonic stenosis. Pulmonary Artery: 28. Venous: There is systolic hepatic vein flow reversal consistent with severe TR. The inferior vena cava is dilated in size with less than 50% respiratory variability, suggesting right atrial pressure of 15 mmHg. LEFT VENTRICLE PLAX 2D LVIDd:         4.20 cm Diastology LVIDs:         3.50 cm LV e' medial:    8.81 cm/s LV PW:         1.10 cm LV E/e' medial:  6.2 LV IVS:        1.00 cm LV e' lateral:   12.60 cm/s                        LV E/e' lateral: 4.3  RIGHT VENTRICLE          IVC RV Basal diam:  5.50 cm  IVC diam: 2.40 cm RV Mid diam:    4.50 cm LEFT ATRIUM         Index LA diam:    3.20 cm 1.61 cm/m  AORTIC VALVE LVOT Vmax:   78.40 cm/s LVOT Vmean:  50.100 cm/s LVOT VTI:    0.161 m MITRAL VALVE               TRICUSPID  VALVE MV Area (PHT): 2.76 cm    TR Peak grad:   28.1 mmHg MV Decel Time: 275 msec  TR Vmax:        265.00 cm/s MV E velocity: 54.40 cm/s MV A velocity: 58.70 cm/s  SHUNTS MV E/A ratio:  0.93        Systemic VTI: 0.16 m Carlyle Dolly MD Electronically signed by Carlyle Dolly MD Signature Date/Time: 10/30/2020/10:21:30 AM    Final      LOS: 5 days   Signature  Lala Lund M.D on 10/31/2020 at 12:04 PM   -  To page go to [email or web address]

## 2020-11-01 ENCOUNTER — Inpatient Hospital Stay (HOSPITAL_COMMUNITY): Payer: Medicaid Other | Admitting: Anesthesiology

## 2020-11-01 ENCOUNTER — Encounter (HOSPITAL_COMMUNITY)
Admission: AD | Disposition: A | Discharge status: Home / Self Care | Payer: Self-pay | Source: Ambulatory Visit | Attending: Internal Medicine

## 2020-11-01 ENCOUNTER — Inpatient Hospital Stay (HOSPITAL_COMMUNITY): Payer: Medicaid Other | Admitting: Certified Registered Nurse Anesthetist

## 2020-11-01 ENCOUNTER — Inpatient Hospital Stay (HOSPITAL_COMMUNITY): Payer: Medicaid Other

## 2020-11-01 ENCOUNTER — Encounter (HOSPITAL_COMMUNITY): Payer: Self-pay | Admitting: Internal Medicine

## 2020-11-01 DIAGNOSIS — I361 Nonrheumatic tricuspid (valve) insufficiency: Secondary | ICD-10-CM | POA: Diagnosis not present

## 2020-11-01 DIAGNOSIS — R7881 Bacteremia: Secondary | ICD-10-CM

## 2020-11-01 HISTORY — PX: TEE WITHOUT CARDIOVERSION: SHX5443

## 2020-11-01 HISTORY — PX: INCISION AND DRAINAGE ABSCESS: SHX5864

## 2020-11-01 HISTORY — PX: BUBBLE STUDY: SHX6837

## 2020-11-01 LAB — AEROBIC/ANAEROBIC CULTURE W GRAM STAIN (SURGICAL/DEEP WOUND)

## 2020-11-01 LAB — CBC WITH DIFFERENTIAL/PLATELET
Abs Immature Granulocytes: 0.11 10*3/uL — ABNORMAL HIGH (ref 0.00–0.07)
Basophils Absolute: 0.1 10*3/uL (ref 0.0–0.1)
Basophils Relative: 1 %
Eosinophils Absolute: 0.2 10*3/uL (ref 0.0–0.5)
Eosinophils Relative: 2 %
HCT: 33.4 % — ABNORMAL LOW (ref 36.0–46.0)
Hemoglobin: 10.2 g/dL — ABNORMAL LOW (ref 12.0–15.0)
Immature Granulocytes: 1 %
Lymphocytes Relative: 27 %
Lymphs Abs: 2.4 10*3/uL (ref 0.7–4.0)
MCH: 27.5 pg (ref 26.0–34.0)
MCHC: 30.5 g/dL (ref 30.0–36.0)
MCV: 90 fL (ref 80.0–100.0)
Monocytes Absolute: 0.5 10*3/uL (ref 0.1–1.0)
Monocytes Relative: 5 %
Neutro Abs: 5.7 10*3/uL (ref 1.7–7.7)
Neutrophils Relative %: 64 %
Platelets: 613 10*3/uL — ABNORMAL HIGH (ref 150–400)
RBC: 3.71 MIL/uL — ABNORMAL LOW (ref 3.87–5.11)
RDW: 15.4 % (ref 11.5–15.5)
WBC: 9 10*3/uL (ref 4.0–10.5)
nRBC: 0 % (ref 0.0–0.2)

## 2020-11-01 LAB — COMPREHENSIVE METABOLIC PANEL
ALT: 6 U/L (ref 0–44)
AST: 13 U/L — ABNORMAL LOW (ref 15–41)
Albumin: 2.8 g/dL — ABNORMAL LOW (ref 3.5–5.0)
Alkaline Phosphatase: 106 U/L (ref 38–126)
Anion gap: 9 (ref 5–15)
BUN: 20 mg/dL (ref 6–20)
CO2: 31 mmol/L (ref 22–32)
Calcium: 9.5 mg/dL (ref 8.9–10.3)
Chloride: 97 mmol/L — ABNORMAL LOW (ref 98–111)
Creatinine, Ser: 1.17 mg/dL — ABNORMAL HIGH (ref 0.44–1.00)
GFR, Estimated: >60 mL/min (ref >60)
Glucose, Bld: 102 mg/dL — ABNORMAL HIGH (ref 70–99)
Potassium: 4.7 mmol/L (ref 3.5–5.1)
Sodium: 137 mmol/L (ref 135–145)
Total Bilirubin: 0.4 mg/dL (ref 0.3–1.2)
Total Protein: 7.4 g/dL (ref 6.5–8.1)

## 2020-11-01 LAB — CULTURE, BLOOD (ROUTINE X 2)
Culture: NO GROWTH
Culture: NO GROWTH
Special Requests: ADEQUATE
Special Requests: ADEQUATE

## 2020-11-01 LAB — C-REACTIVE PROTEIN: CRP: 1 mg/dL — ABNORMAL HIGH (ref <1.0)

## 2020-11-01 LAB — PROCALCITONIN: Procalcitonin: <0.1 ng/mL

## 2020-11-01 LAB — MAGNESIUM: Magnesium: 2.2 mg/dL (ref 1.7–2.4)

## 2020-11-01 SURGERY — INCISION AND DRAINAGE, ABSCESS
Anesthesia: General | Site: Leg Upper | Laterality: Right

## 2020-11-01 SURGERY — ECHOCARDIOGRAM, TRANSESOPHAGEAL
Anesthesia: Monitor Anesthesia Care

## 2020-11-01 MED ORDER — PHENYLEPHRINE 40 MCG/ML (10ML) SYRINGE FOR IV PUSH (FOR BLOOD PRESSURE SUPPORT)
PREFILLED_SYRINGE | INTRAVENOUS | Status: DC | PRN
  Administered 2020-11-01: 80 ug via INTRAVENOUS

## 2020-11-01 MED ORDER — MEPERIDINE HCL 25 MG/ML IJ SOLN: 6.2500 mg | INTRAMUSCULAR | Status: DC | PRN

## 2020-11-01 MED ORDER — PROPOFOL 10 MG/ML IV BOLUS
INTRAVENOUS | Status: DC | PRN
  Administered 2020-11-01: 30 mg via INTRAVENOUS

## 2020-11-01 MED ORDER — SODIUM CHLORIDE 0.9 % IV SOLN: INTRAVENOUS | Status: DC | PRN

## 2020-11-01 MED ORDER — SODIUM CHLORIDE 0.9 % IV SOLN: INTRAVENOUS | Status: DC

## 2020-11-01 MED ORDER — 0.9 % SODIUM CHLORIDE (POUR BTL) OPTIME
TOPICAL | Status: DC | PRN
  Administered 2020-11-01: 1000 mL

## 2020-11-01 MED ORDER — CHLORHEXIDINE GLUCONATE 0.12 % MT SOLN
15.0000 mL | Freq: Once | OROMUCOSAL | Status: AC
  Administered 2020-11-01: 15 mL via OROMUCOSAL
  Filled 2020-11-01: qty 15

## 2020-11-01 MED ORDER — LIDOCAINE HCL (CARDIAC) PF 100 MG/5ML IV SOSY
PREFILLED_SYRINGE | INTRAVENOUS | Status: DC | PRN
  Administered 2020-11-01: 60 mg via INTRAVENOUS

## 2020-11-01 MED ORDER — SODIUM CHLORIDE 0.9 % IV SOLN
INTRAVENOUS | Status: DC
  Administered 2020-11-01: 500 mL via INTRAVENOUS

## 2020-11-01 MED ORDER — AMISULPRIDE (ANTIEMETIC) 5 MG/2ML IV SOLN
10.0000 mg | Freq: Once | INTRAVENOUS | Status: DC | PRN
Start: 2020-11-01 — End: 2020-11-01

## 2020-11-01 MED ORDER — MIDAZOLAM HCL 2 MG/2ML IJ SOLN
INTRAMUSCULAR | Status: AC
  Filled 2020-11-01: qty 2

## 2020-11-01 MED ORDER — ONDANSETRON HCL 4 MG/2ML IJ SOLN
INTRAMUSCULAR | Status: DC | PRN
  Administered 2020-11-01: 4 mg via INTRAVENOUS

## 2020-11-01 MED ORDER — MIDAZOLAM HCL 2 MG/2ML IJ SOLN
INTRAMUSCULAR | Status: DC | PRN
  Administered 2020-11-01: 2 mg via INTRAVENOUS

## 2020-11-01 MED ORDER — HYDROMORPHONE HCL 1 MG/ML IJ SOLN: 0.2500 mg | INTRAMUSCULAR | Status: DC | PRN

## 2020-11-01 MED ORDER — OXYCODONE HCL 5 MG/5ML PO SOLN: 5.0000 mg | Freq: Once | ORAL | Status: DC | PRN

## 2020-11-01 MED ORDER — PROPOFOL 10 MG/ML IV BOLUS
INTRAVENOUS | Status: DC | PRN
  Administered 2020-11-01: 200 mg via INTRAVENOUS

## 2020-11-01 MED ORDER — FENTANYL CITRATE (PF) 250 MCG/5ML IJ SOLN
INTRAMUSCULAR | Status: AC
  Filled 2020-11-01: qty 5

## 2020-11-01 MED ORDER — SCOPOLAMINE 1 MG/3DAYS TD PT72
1.0000 | MEDICATED_PATCH | TRANSDERMAL | Status: DC
  Administered 2020-11-01: 1.5 mg via TRANSDERMAL
  Filled 2020-11-01: qty 1

## 2020-11-01 MED ORDER — FENTANYL CITRATE (PF) 250 MCG/5ML IJ SOLN
INTRAMUSCULAR | Status: DC | PRN
  Administered 2020-11-01: 100 ug via INTRAVENOUS

## 2020-11-01 MED ORDER — PROMETHAZINE HCL 25 MG/ML IJ SOLN: 6.2500 mg | INTRAMUSCULAR | Status: DC | PRN

## 2020-11-01 MED ORDER — DEXMEDETOMIDINE (PRECEDEX) IN NS 20 MCG/5ML (4 MCG/ML) IV SYRINGE
PREFILLED_SYRINGE | INTRAVENOUS | Status: DC | PRN
  Administered 2020-11-01: 12 ug via INTRAVENOUS
  Administered 2020-11-01: 8 ug via INTRAVENOUS

## 2020-11-01 MED ORDER — KETAMINE HCL 10 MG/ML IJ SOLN
INTRAMUSCULAR | Status: DC | PRN
  Administered 2020-11-01: 30 mg via INTRAVENOUS

## 2020-11-01 MED ORDER — KETAMINE HCL 50 MG/5ML IJ SOSY
PREFILLED_SYRINGE | INTRAMUSCULAR | Status: AC
  Filled 2020-11-01: qty 5

## 2020-11-01 MED ORDER — LACTATED RINGERS IV SOLN: INTRAVENOUS | Status: DC

## 2020-11-01 MED ORDER — HYDROMORPHONE HCL 1 MG/ML IJ SOLN
1.0000 mg | Freq: Once | INTRAMUSCULAR | Status: AC
  Administered 2020-11-01: 1 mg via INTRAVENOUS
  Filled 2020-11-01: qty 1

## 2020-11-01 MED ORDER — GLYCOPYRROLATE 0.2 MG/ML IJ SOLN
INTRAMUSCULAR | Status: DC | PRN
  Administered 2020-11-01 (×2): .1 mg via INTRAVENOUS

## 2020-11-01 MED ORDER — DEXAMETHASONE SODIUM PHOSPHATE 10 MG/ML IJ SOLN
INTRAMUSCULAR | Status: DC | PRN
  Administered 2020-11-01: 5 mg via INTRAVENOUS

## 2020-11-01 MED ORDER — PROPOFOL 500 MG/50ML IV EMUL
INTRAVENOUS | Status: DC | PRN
  Administered 2020-11-01: 125 ug/kg/min via INTRAVENOUS

## 2020-11-01 MED ORDER — PROPOFOL 10 MG/ML IV BOLUS
INTRAVENOUS | Status: AC
  Filled 2020-11-01: qty 20

## 2020-11-01 MED ORDER — ACETAMINOPHEN 500 MG PO TABS
1000.0000 mg | ORAL_TABLET | Freq: Once | ORAL | Status: AC
  Administered 2020-11-01: 1000 mg via ORAL
  Filled 2020-11-01: qty 2

## 2020-11-01 MED ORDER — OXYCODONE HCL 5 MG PO TABS: 5.0000 mg | ORAL_TABLET | Freq: Once | ORAL | Status: DC | PRN

## 2020-11-01 SURGICAL SUPPLY — 26 items
BNDG GAUZE ELAST 4 BULKY (GAUZE/BANDAGES/DRESSINGS) IMPLANT
CANISTER SUCT 3000ML PPV (MISCELLANEOUS) ×2 IMPLANT
COVER SURGICAL LIGHT HANDLE (MISCELLANEOUS) ×2 IMPLANT
COVER WAND RF STERILE (DRAPES) ×2 IMPLANT
DRAPE LAPAROSCOPIC ABDOMINAL (DRAPES) IMPLANT
DRAPE LAPAROTOMY 100X72 PEDS (DRAPES) IMPLANT
DRSG PAD ABDOMINAL 8X10 ST (GAUZE/BANDAGES/DRESSINGS) IMPLANT
ELECT REM PT RETURN 9FT ADLT (ELECTROSURGICAL) ×2
ELECTRODE REM PT RTRN 9FT ADLT (ELECTROSURGICAL) ×1 IMPLANT
GAUZE SPONGE 4X4 12PLY STRL (GAUZE/BANDAGES/DRESSINGS) IMPLANT
GLOVE BIO SURGEON STRL SZ7.5 (GLOVE) ×2 IMPLANT
GLOVE INDICATOR 8.0 STRL GRN (GLOVE) ×2 IMPLANT
GOWN STRL REUS W/ TWL LRG LVL3 (GOWN DISPOSABLE) ×1 IMPLANT
GOWN STRL REUS W/ TWL XL LVL3 (GOWN DISPOSABLE) ×1 IMPLANT
GOWN STRL REUS W/TWL LRG LVL3 (GOWN DISPOSABLE) ×2
GOWN STRL REUS W/TWL XL LVL3 (GOWN DISPOSABLE) ×2
KIT BASIN OR (CUSTOM PROCEDURE TRAY) ×2 IMPLANT
KIT TURNOVER KIT B (KITS) ×2 IMPLANT
NS IRRIG 1000ML POUR BTL (IV SOLUTION) ×2 IMPLANT
PACK GENERAL/GYN (CUSTOM PROCEDURE TRAY) ×2 IMPLANT
PAD ARMBOARD 7.5X6 YLW CONV (MISCELLANEOUS) ×2 IMPLANT
PENCIL SMOKE EVACUATOR (MISCELLANEOUS) ×2 IMPLANT
SWAB COLLECTION DEVICE MRSA (MISCELLANEOUS) IMPLANT
SWAB CULTURE ESWAB REG 1ML (MISCELLANEOUS) IMPLANT
TOWEL GREEN STERILE (TOWEL DISPOSABLE) ×2 IMPLANT
TOWEL GREEN STERILE FF (TOWEL DISPOSABLE) ×2 IMPLANT

## 2020-11-01 NOTE — Anesthesia Preprocedure Evaluation (Addendum)
Anesthesia Evaluation  Patient identified by MRN, date of birth, ID band Patient awake    Reviewed: Allergy & Precautions, NPO status , Patient's Chart, lab work & pertinent test results  Airway Mallampati: I  TM Distance: >3 FB Neck ROM: Full    Dental  (+) Poor Dentition, Chipped, Dental Advisory Given,    Pulmonary former smoker,  Quit smoking 2020   Pulmonary exam normal breath sounds clear to auscultation       Cardiovascular (-) hypertensionNormal cardiovascular exam Rhythm:Regular Rate:Normal     Neuro/Psych PSYCHIATRIC DISORDERS Anxiety Depression Bipolar Disorder negative neurological ROS     GI/Hepatic negative GI ROS, (+)     substance abuse (hx polysubstance abuse- cocaine, heroin, fentanyl)  cocaine use and IV drug use, Hepatitis -, C  Endo/Other  negative endocrine ROS  Renal/GU Renal InsufficiencyRenal diseaseCr 1.17  negative genitourinary   Musculoskeletal  (+) Arthritis , Osteoarthritis,  narcotic dependentRecent epidural abscess and pregnancy- now s/p delivery and tubal   Abdominal (+) - obese,   Peds negative pediatric ROS (+)  Hematology  (+) Blood dyscrasia, anemia , hct 33.4   Anesthesia Other Findings Thigh, breast abscesses  Reproductive/Obstetrics negative OB ROS U preg neg 6/15                            Anesthesia Physical Anesthesia Plan  ASA: 3  Anesthesia Plan: General   Post-op Pain Management:    Induction: Intravenous  PONV Risk Score and Plan: 3 and Ondansetron, Dexamethasone, Midazolam, Treatment may vary due to age or medical condition and Scopolamine patch - Pre-op  Airway Management Planned: LMA  Additional Equipment: None  Intra-op Plan:   Post-operative Plan: Extubation in OR  Informed Consent: I have reviewed the patients History and Physical, chart, labs and discussed the procedure including the risks, benefits and alternatives for  the proposed anesthesia with the patient or authorized representative who has indicated his/her understanding and acceptance.     Dental advisory given  Plan Discussed with: CRNA  Anesthesia Plan Comments:         Anesthesia Quick Evaluation

## 2020-11-01 NOTE — Progress Notes (Signed)
Day of Surgery  Subjective: CC: New right thigh abscesses x2 just came up over the weekend.  Other wounds stable.  Objective: Vital signs in last 24 hours: Temp:  [98.5 F (36.9 C)-98.7 F (37.1 C)] 98.5 F (36.9 C) (06/21 0406) Pulse Rate:  [58-76] 62 (06/21 0406) Resp:  [18-20] 19 (06/21 0406) BP: (100-131)/(69-102) 125/85 (06/21 0406) SpO2:  [91 %-97 %] 97 % (06/21 0406) Weight:  [85.8 kg] 85.8 kg (06/21 0749) Last BM Date: 10/30/20  Intake/Output from previous day: 06/20 0701 - 06/21 0700 In: 600 [P.O.:600] Out: -  Intake/Output this shift: No intake/output data recorded.  PE: Skin: Wounds - L breast: not looked at per patient's request - R thigh: Appears stable. No signs of surrounding erythema or induration to suggest cellulitis. No fluctuance. No purulent drainage or necrosis.  - L thigh: Appears stable. No signs of surrounding erythema or induration to suggest cellulitis. No fluctuance. No purulent drainage or necrosis.  Right thigh: 2 new areas of erythema and central fluctuance on lateral central thigh.  Lab Results:  Recent Labs    10/31/20 0658 11/01/20 0331  WBC 9.5 9.0  HGB 9.3* 10.2*  HCT 30.7* 33.4*  PLT 590* 613*   BMET Recent Labs    10/31/20 0658 11/01/20 0331  NA 137 137  K 4.3 4.7  CL 95* 97*  CO2 31 31  GLUCOSE 105* 102*  BUN 18 20  CREATININE 0.99 1.17*  CALCIUM 9.5 9.5   PT/INR No results for input(s): LABPROT, INR in the last 72 hours. CMP     Component Value Date/Time   NA 137 11/01/2020 0331   NA 136 07/02/2013 0547   K 4.7 11/01/2020 0331   K 3.7 07/02/2013 0547   CL 97 (L) 11/01/2020 0331   CL 102 07/02/2013 0547   CO2 31 11/01/2020 0331   CO2 30 07/02/2013 0547   GLUCOSE 102 (H) 11/01/2020 0331   GLUCOSE 86 07/28/2020 0626   BUN 20 11/01/2020 0331   BUN 7 07/02/2013 0547   CREATININE 1.17 (H) 11/01/2020 0331   CREATININE 0.82 07/02/2013 0547   CALCIUM 9.5 11/01/2020 0331   CALCIUM 8.8 07/02/2013 0547    PROT 7.4 11/01/2020 0331   PROT 7.7 07/10/2013 1121   ALBUMIN 2.8 (L) 11/01/2020 0331   ALBUMIN 3.4 07/10/2013 1121   AST 13 (L) 11/01/2020 0331   AST 115 (H) 07/10/2013 1121   ALT 6 11/01/2020 0331   ALT 95 (H) 07/10/2013 1121   ALKPHOS 106 11/01/2020 0331   ALKPHOS 120 (H) 07/10/2013 1121   BILITOT 0.4 11/01/2020 0331   BILITOT 1.9 (H) 07/10/2013 1121   GFRNONAA >60 11/01/2020 0331   GFRNONAA >60 07/02/2013 0547   GFRAA >60 06/26/2019 1812   GFRAA >60 07/02/2013 0547   Lipase     Component Value Date/Time   LIPASE 19 07/13/2020 1035   LIPASE 80 06/29/2013 0454       Studies/Results: ECHOCARDIOGRAM LIMITED  Result Date: 10/30/2020    ECHOCARDIOGRAM LIMITED REPORT   Patient Name:   Kelly [name] Date of Exam: 10/30/2020 Medical Rec #:  [id number]          Height:       67.5 in Accession #:    [id number]         Weight:       189.2 lb Date of Birth:  16-Sep-1985           BSA:  1.986 m Patient Age:    35 years           BP:           136/98 mmHg Patient Gender: F                  HR:           64 bpm. Exam Location:  Inpatient Procedure: Limited Echo, Limited Color Doppler and Cardiac Doppler STAT ECHO Indications:    endocarditis  History:        Patient has prior history of Echocardiogram examinations, most                 recent 09/22/2020. Multiple abscess; Risk Factors:IV drug use.  Sonographer:    Johny Chess Referring Phys: [place]  1. Left ventricular ejection fraction, by estimation, is 40 to 45%. The left ventricle has mildly decreased function. The left ventricle demonstrates global hypokinesis.  2. Ventricular septum is flattened in diastole consistent with RV volume overload. . Right ventricular systolic function is moderately reduced. The right ventricular size is severely enlarged. There is mildly elevated pulmonary artery systolic pressure.  3. The mitral valve is normal in structure. No evidence of mitral valve regurgitation. No  evidence of mitral stenosis.  4. Tricuspid valve regurgitation is severe.  5. The aortic valve is tricuspid. No aortic stenosis is present.  6. The inferior vena cava is dilated in size with <50% respiratory variability, suggesting right atrial pressure of 15 mmHg.  7. Limited echo to evaluate for endocarditis. No visual evidence of current endocarditis FINDINGS  Left Ventricle: Left ventricular ejection fraction, by estimation, is 40 to 45%. The left ventricle has mildly decreased function. The left ventricle demonstrates global hypokinesis. The left ventricular internal cavity size was normal in size. There is  no left ventricular hypertrophy. Right Ventricle: Ventricular septum is flattened in diastole consistent with RV volume overload. The right ventricular size is severely enlarged. Right ventricular systolic function is moderately reduced. There is mildly elevated pulmonary artery systolic pressure. The tricuspid regurgitant velocity is 2.65 m/s, and with an assumed right atrial pressure of 15 mmHg, the estimated right ventricular systolic pressure is 12.7 mmHg. Pericardium: There is no evidence of pericardial effusion. Mitral Valve: The mitral valve is normal in structure. No evidence of mitral valve stenosis. Tricuspid Valve: Tricuspid valve regurgitation is severe. No evidence of tricuspid stenosis. Aortic Valve: The aortic valve is tricuspid. No aortic stenosis is present. Pulmonic Valve: The pulmonic valve was not well visualized. Pulmonic valve regurgitation is not visualized. No evidence of pulmonic stenosis. Pulmonary Artery: 28. Venous: There is systolic hepatic vein flow reversal consistent with severe TR. The inferior vena cava is dilated in size with less than 50% respiratory variability, suggesting right atrial pressure of 15 mmHg. LEFT VENTRICLE PLAX 2D LVIDd:         4.20 cm Diastology LVIDs:         3.50 cm LV e' medial:    8.81 cm/s LV PW:         1.10 cm LV E/e' medial:  6.2 LV IVS:         1.00 cm LV e' lateral:   12.60 cm/s                        LV E/e' lateral: 4.3  RIGHT VENTRICLE          IVC RV Basal diam:  5.50  cm  IVC diam: 2.40 cm RV Mid diam:    4.50 cm LEFT ATRIUM         Index LA diam:    3.20 cm 1.61 cm/m  AORTIC VALVE LVOT Vmax:   78.40 cm/s LVOT Vmean:  50.100 cm/s LVOT VTI:    0.161 m MITRAL VALVE               TRICUSPID VALVE MV Area (PHT): 2.76 cm    TR Peak grad:   28.1 mmHg MV Decel Time: 275 msec    TR Vmax:        265.00 cm/s MV E velocity: 54.40 cm/s MV A velocity: 58.70 cm/s  SHUNTS MV E/A ratio:  0.93        Systemic VTI: 0.16 m Carlyle Dolly MD Electronically signed by Carlyle Dolly MD Signature Date/Time: 10/30/2020/10:21:30 AM    Final     Anti-infectives: Anti-infectives (From admission, onward)    Start     Dose/Rate Route Frequency Ordered Stop   10/30/20 1800  [MAR Hold]  vancomycin (VANCOCIN) IVPB 750 mg/150 ml premix        (MAR Hold since Tue 11/01/2020 at 0729.Hold Reason: Transfer to a Procedural area)   750 mg 150 mL/hr over 60 Minutes Intravenous Every 12 hours 10/30/20 1018     10/30/20 0930  [MAR Hold]  Ampicillin-Sulbactam (UNASYN) 3 g in sodium chloride 0.9 % 100 mL IVPB        (MAR Hold since Tue 11/01/2020 at 0729.Hold Reason: Transfer to a Procedural area)   3 g 200 mL/hr over 30 Minutes Intravenous Every 8 hours 10/30/20 0838     10/27/20 0530  Vancomycin (VANCOCIN) 1,250 mg in sodium chloride 0.9 % 250 mL IVPB  Status:  Discontinued        1,250 mg 166.7 mL/hr over 90 Minutes Intravenous Every 12 hours 10/26/20 1932 10/30/20 1018   10/26/20 1730  vancomycin (VANCOREADY) IVPB 1750 mg/350 mL        1,750 mg 175 mL/hr over 120 Minutes Intravenous  Once 10/26/20 1712 10/26/20 1946        Assessment/Plan POD 6 S/P I&D bilateral upper thigh abscesses, L breast abscess, CW 6/15 for Multiple soft tissue abscesses Right thigh abscesses - BID dressing changes. Wet to dry on left breast with minimal superficial packing. Dry gauze on b/l  thighs - multimodal pain control in addition to methadone. - continue IV Abx per ID. Follow cultures - currently S. Aureus  - Wounds stable.  -new lesions on right thigh - we will plan incision and drainage in OR today. We spent time reviewing the planned procedure, material risks (including but not limited to pain, bleeding, recurrence, need for additional procedures, damage to surrounding structures) benefits and alternatives. She expresses understanding and is able to voice wound expectations postoperatively.    FEN: diet as tolerated following surgery ID: vancomycin 6/15 >>  Unasyn 6/19  -> VTE: SCDs, Lovenox   L hand abscess - s/p I&D by Dr. Apolonio Schneiders - per Hand Surgery  Per medicine IVDA Endocarditis Heart failure HTN HCV syphilis   LOS: 6 days   Nadeen Landau, MD [facility] Use [email or web address] to contact on call provider

## 2020-11-01 NOTE — Progress Notes (Signed)
PROGRESS NOTE        PATIENT DETAILS Name: Kelly [name] Age: 35 y.o. Sex: female Date of Birth: 06/26/1985 Admit Date: 10/26/2020 Admitting Physician Lequita Halt, MD PCP:[facility], [facility]  Brief Narrative: Patient is a 35 y.o. 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  She has a history of IV heroin/cocaine use-but claims she has been clean for several months.  UDS negative.  Significant events: 6/15>> admit for left breast/right thigh/left hand abscess.  Significant studies: 5/12>> TTE: EF 26-33%, RV systolic function moderately reduced.  Severe TR. 6/15>> CXR: No pneumonia 6/15>> x-ray left hand: No fracture/dislocation-dorsal soft tissue swelling  Antimicrobial therapy: Vancomycin: 6/15>>  Microbiology data: 6/15>> COVID/influenza PCR: Negative 6/15>> abscess cultures: Staff aureus (sensitivities pending) 6/16>> blood cultures: No growth  Procedures :  6/15>> I&D of left breast/right thigh/left thigh/left hand abscess by general surgery and orthopedics.  6/21 - by [facility] Incision and drainage of right thigh abscess, 2 x 2 cm involving subcutaneous fat Incision and drainage of right lateral thigh abscess, 2 x 2 cm, also involving subcutaneous fat  Consults:  General surgery, hand surgery  DVT Prophylaxis : enoxaparin (LOVENOX) injection 40 mg Start: 10/28/20 1630 SCD's Start: 10/27/20 0030   Subjective:  Patient in bed, sleepy post I&D, no chest- abdominal pain.   Assessment/Plan:  Left breast/right/left thigh and left hand abscess, now developing right thigh abscess: S/p I&D by general surgery/hand surgery-wound cultures positive for staph aureus-remains on IV Vancomycin and Unasyn.  ID also following.  Procalcitonin and blood cultures so far negative , unfortunately on 10/31/2020 she has  developed 2 new lesions on her right thigh.  Discussed with ID, TEE pending, general surgery post incision and drainage on 11/01/20.  History of recurrent tricuspid valve endocarditis (MSSA 2019, Enterococcus 2020, MRSA 2022): Concerned that she may have underlying endocarditis given her presentation-unfortunately blood cultures were done after patient received antibiotics-ID is following and directing antibiotics.  Repeat TTE stable, TEE later on 11/01/20.   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 difficile colitis - denies any diarrhea  History of syphilis: Stable for further treatment by ID MD outpatient setting  Recent vaginal birth/delivery: She is not breast-feeding-claims that her 1 month child is  with family members.  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  Diet:  Diet Order     None        Code Status:  Full code   Family Communication:  None at bedside  Disposition Plan:  Status is: Inpatient  Remains inpatient appropriate because:Inpatient level of care appropriate due to severity of illness  Dispo: The patient is from: Home              Anticipated d/c is to: Home              Patient currently is not medically stable to d/c.   Difficult to place patient No   Barriers to Discharge: Multiple abscesses-needs ongoing IV antibiotics-awaiting culture data.  Antimicrobial agents: Anti-infectives (From admission, onward)    Start     Dose/Rate Route Frequency Ordered Stop   10/30/20 1800  [MAR Hold]  vancomycin (VANCOCIN)  IVPB 750 mg/150 ml premix        (MAR Hold since Tue 11/01/2020 at 1047.Hold Reason: Transfer to a Procedural area)   750 mg 150 mL/hr over 60 Minutes Intravenous Every 12 hours 10/30/20 1018     10/30/20 0930  [MAR Hold]  Ampicillin-Sulbactam (UNASYN) 3 g in sodium chloride 0.9 % 100 mL IVPB        (MAR Hold since Tue 11/01/2020 at  1047.Hold Reason: Transfer to a Procedural area)   3 g 200 mL/hr over 30 Minutes Intravenous Every 8 hours 10/30/20 0838     10/27/20 0530  Vancomycin (VANCOCIN) 1,250 mg in sodium chloride 0.9 % 250 mL IVPB  Status:  Discontinued        1,250 mg 166.7 mL/hr over 90 Minutes Intravenous Every 12 hours 10/26/20 1932 10/30/20 1018   10/26/20 1730  vancomycin (VANCOREADY) IVPB 1750 mg/350 mL        1,750 mg 175 mL/hr over 120 Minutes Intravenous  Once 10/26/20 1712 10/26/20 1946        Time spent: 25 minutes-Greater than 50% of this time was spent in counseling, explanation of diagnosis, planning of further management, and coordination of care.  MEDICATIONS: Scheduled Meds:  [MAR Hold] acidophilus  2 capsule Oral TID   [MAR Hold] enoxaparin (LOVENOX) injection  40 mg Subcutaneous Q24H   [MAR Hold] furosemide  40 mg Oral Daily   [MAR Hold] methadone  130 mg Oral Daily   [MAR Hold] NIFEdipine  30 mg Oral Daily   [MAR Hold] prenatal vitamin w/FE, FA  1 tablet Oral Q1200   [MAR Hold] sertraline  50 mg Oral Daily   Continuous Infusions:  sodium chloride 500 mL (11/01/20 1059)   [MAR Hold] ampicillin-sulbactam (UNASYN) IV 3 g (11/01/20 0517)   [MAR Hold] vancomycin 750 mg (11/01/20 0550)   PRN Meds:.[MAR Hold] acetaminophen, [MAR Hold]  HYDROmorphone (DILAUDID) injection, [MAR Hold] methocarbamol   PHYSICAL EXAM: Vital signs: Vitals:   11/01/20 0915 11/01/20 0942 11/01/20 1051 11/01/20 1103  BP: 125/83 (!) 117/91 132/90   Pulse: (!) 57 (!) 58    Resp: (!) 8 18 14    Temp: 98.2 F (36.8 C) 98.4 F (36.9 C)  97.9 F (36.6 C)  TempSrc:  Oral  Oral  SpO2: 92% (!) 88% 96%   Weight:      Height:       Filed Weights   10/26/20 1649 11/01/20 0749  Weight: 85.8 kg 85.8 kg   Body mass index is 29.19 kg/m.   Gen Exam:  Awake Alert, No new F.N deficits, Normal affect [place].AT,PERRAL Supple Neck,No JVD, No cervical lymphadenopathy appriciated.  Symmetrical Chest wall movement,  Good air movement bilaterally, CTAB RRR,No Gallops, Rubs or new Murmurs, No Parasternal Heave +ve B.Sounds, Abd Soft, No tenderness, No organomegaly appriciated, No rebound - guarding or rigidity. L arm and hand under bandage, right thigh post I&D under dressing   I have personally reviewed following labs and imaging studies  LABORATORY DATA: CBC: Recent Labs  Lab 10/28/20 0038 10/29/20 0200 10/30/20 0530 10/31/20 0658 11/01/20 0331  WBC 8.9 8.6 8.4 9.5 9.0  NEUTROABS  --   --  5.0 6.1 5.7  HGB 8.5* 9.1* 9.3* 9.3* 10.2*  HCT 27.7* 30.0* 30.8* 30.7* 33.4*  MCV 91.4 93.5 91.9 90.6 90.0  PLT 511* 498* 545* 590* 613*    Basic Metabolic Panel: Recent Labs  Lab 10/28/20 0038 10/29/20 0200 10/30/20 0530 10/31/20 0658 11/01/20 0331  NA 139 139  137 137 137  K 3.9 4.2 4.4 4.3 4.7  CL 100 102 99 95* 97*  CO2 31 29 28 31 31   GLUCOSE 90 99 93 105* 102*  BUN 14 16 15 18 20   CREATININE 0.97 0.87 0.87 0.99 1.17*  CALCIUM 8.5* 8.7* 9.2 9.5 9.5  MG 1.7 2.1 2.0 2.0 2.2    GFR: Estimated Creatinine Clearance: 75.6 mL/min (A) (by C-G formula based on SCr of 1.17 mg/dL (H)).  Liver Function Tests: Recent Labs  Lab 10/26/20 1756 10/28/20 0038 10/30/20 0530 10/31/20 0658 11/01/20 0331  AST 15 11* 12* 11* 13*  ALT 7 7 6 6 6   ALKPHOS 95 100 99 97 106  BILITOT 0.6 0.6 0.7 0.6 0.4  PROT 6.1* 5.9* 6.5 6.6 7.4  ALBUMIN 2.4* 2.3* 2.6* 2.5* 2.8*   No results for input(s): LIPASE, AMYLASE in the last 168 hours. No results for input(s): AMMONIA in the last 168 hours.  Coagulation Profile: No results for input(s): INR, PROTIME in the last 168 hours.  Cardiac Enzymes: No results for input(s): CKTOTAL, CKMB, CKMBINDEX, TROPONINI in the last 168 hours.  BNP (last 3 results) No results for input(s): PROBNP in the last 8760 hours.  Lipid Profile: No results for input(s): CHOL, HDL, LDLCALC, TRIG, CHOLHDL, LDLDIRECT in the last 72 hours.  Thyroid Function Tests: No results for  input(s): TSH, T4TOTAL, FREET4, T3FREE, THYROIDAB in the last 72 hours.  Anemia Panel: No results for input(s): VITAMINB12, FOLATE, FERRITIN, TIBC, IRON, RETICCTPCT in the last 72 hours.  Urine analysis:    Component Value Date/Time   COLORURINE AMBER (A) 07/13/2020 1320   APPEARANCEUR CLOUDY (A) 07/13/2020 1320   APPEARANCEUR Hazy 07/10/2013 1121   LABSPEC 1.028 07/13/2020 1320   LABSPEC 1.020 07/10/2013 1121   PHURINE 5.0 07/13/2020 1320   GLUCOSEU NEGATIVE 07/13/2020 1320   GLUCOSEU Negative 07/10/2013 1121   HGBUR SMALL (A) 07/13/2020 1320   BILIRUBINUR NEGATIVE 07/13/2020 1320   BILIRUBINUR Negative 07/10/2013 1121   KETONESUR 5 (A) 07/13/2020 1320   PROTEINUR 100 (A) 07/13/2020 1320   UROBILINOGEN 1.0 02/15/2015 0530   NITRITE POSITIVE (A) 07/13/2020 1320   LEUKOCYTESUR LARGE (A) 07/13/2020 1320   LEUKOCYTESUR Trace 07/10/2013 1121    Sepsis Labs: Lactic Acid, Venous    Component Value Date/Time   LATICACIDVEN 1.5 07/14/2020 0021    MICROBIOLOGY: Recent Results (from the past 240 hour(s))  Resp Panel by RT-PCR (Flu A&B, Covid) Nasopharyngeal Swab     Status: None   Collection Time: 10/26/20  7:05 PM   Specimen: Nasopharyngeal Swab; Nasopharyngeal(NP) swabs in vial transport medium  Result Value Ref Range Status   SARS Coronavirus 2 by RT PCR NEGATIVE NEGATIVE Final    Comment: (NOTE) SARS-CoV-2 target nucleic acids are NOT DETECTED.  The SARS-CoV-2 RNA is generally detectable in upper respiratory specimens during the acute phase of infection. The lowest concentration of SARS-CoV-2 viral copies this assay can detect is 138 copies/mL. A negative result does not preclude SARS-Cov-2 infection and should not be used as the sole basis for treatment or other patient management decisions. A negative result may occur with  improper specimen collection/handling, submission of specimen other than nasopharyngeal swab, presence of viral mutation(s) within the areas  targeted by this assay, and inadequate number of viral copies(<138 copies/mL). A negative result must be combined with clinical observations, patient history, and epidemiological information. The expected result is Negative.  Fact Sheet for Patients:  EntrepreneurPulse.com.au  Fact Sheet for Healthcare Providers:  IncredibleEmployment.be  This test is no t yet approved or cleared by the [place] and  has been authorized for detection and/or diagnosis of SARS-CoV-2 by FDA under an Emergency Use Authorization (EUA). This EUA will remain  in effect (meaning this test can be used) for the duration of the COVID-19 declaration under Section 564(b)(1) of the Act, 21 U.S.C.section 360bbb-3(b)(1), unless the authorization is terminated  or revoked sooner.       Influenza A by PCR NEGATIVE NEGATIVE Final   Influenza B by PCR NEGATIVE NEGATIVE Final    Comment: (NOTE) The Xpert Xpress SARS-CoV-2/FLU/RSV plus assay is intended as an aid in the diagnosis of influenza from Nasopharyngeal swab specimens and should not be used as a sole basis for treatment. Nasal washings and aspirates are unacceptable for Xpert Xpress SARS-CoV-2/FLU/RSV testing.  Fact Sheet for Patients: EntrepreneurPulse.com.au  Fact Sheet for Healthcare Providers: IncredibleEmployment.be  This test is not yet approved or cleared by the [place] FDA and has been authorized for detection and/or diagnosis of SARS-CoV-2 by FDA under an Emergency Use Authorization (EUA). This EUA will remain in effect (meaning this test can be used) for the duration of the COVID-19 declaration under Section 564(b)(1) of the Act, 21 U.S.C. section 360bbb-3(b)(1), unless the authorization is terminated or revoked.  Performed at [facility], [place] [address] [place], [place] [address]   Surgical pcr screen     Status: Abnormal   Collection Time:  10/26/20  7:06 PM   Specimen: Nasal Mucosa; Nasal Swab  Result Value Ref Range Status   MRSA, PCR POSITIVE (A) NEGATIVE Final    Comment: RESULT CALLED TO, READ BACK BY AND VERIFIED WITH: A. SOUTZ RN, AT [phone] 10/27/20 D. VANHOOK    Staphylococcus aureus POSITIVE (A) NEGATIVE Final    Comment: (NOTE) The Xpert SA Assay (FDA approved for NASAL specimens in patients 53 years of age and older), is one component of a comprehensive surveillance program. It is not intended to diagnose infection nor to guide or monitor treatment. Performed at [facility], [place] [address], [place], [place] [address]   Aerobic/Anaerobic Culture w Gram Stain (surgical/deep wound)     Status: None (Preliminary result)   Collection Time: 10/26/20  9:40 PM   Specimen: Abscess  Result Value Ref Range Status   Specimen Description ABSCESS  Final   Special Requests NONE  Final   Gram Stain   Final    MODERATE WBC PRESENT, PREDOMINANTLY PMN RARE GRAM POSITIVE COCCI IN CLUSTERS Performed at [facility], [place] [address] [place], [place] [address]    Culture   Final    RARE METHICILLIN RESISTANT STAPHYLOCOCCUS AUREUS RARE EIKENELLA CORRODENS Usually susceptible to penicillin and other beta lactam agents,quinolones,macrolides and tetracyclines. NO ANAEROBES ISOLATED; CULTURE IN PROGRESS FOR 5 DAYS    Report Status PENDING  Incomplete   Organism ID, Bacteria METHICILLIN RESISTANT STAPHYLOCOCCUS AUREUS  Final      Susceptibility   Methicillin resistant staphylococcus aureus - MIC*    CIPROFLOXACIN >=8 RESISTANT Resistant     ERYTHROMYCIN >=8 RESISTANT Resistant     GENTAMICIN <=0.5 SENSITIVE Sensitive     OXACILLIN >=4 RESISTANT Resistant     TETRACYCLINE >=16 RESISTANT Resistant     VANCOMYCIN <=0.5 SENSITIVE Sensitive     TRIMETH/SULFA >=320 RESISTANT Resistant     CLINDAMYCIN <=0.25 SENSITIVE Sensitive     RIFAMPIN <=0.5 SENSITIVE Sensitive     Inducible Clindamycin NEGATIVE Sensitive     *  RARE  METHICILLIN RESISTANT STAPHYLOCOCCUS AUREUS  Aerobic/Anaerobic Culture w Gram Stain (surgical/deep wound)     Status: None (Preliminary result)   Collection Time: 10/26/20  9:44 PM   Specimen: PATH Other; Tissue  Result Value Ref Range Status   Specimen Description ABSCESS  Final   Special Requests LEFT  Final   Gram Stain   Final    ABUNDANT WBC PRESENT, PREDOMINANTLY PMN FEW GRAM POSITIVE COCCI IN CLUSTERS MODERATE GRAM POSITIVE RODS Performed at [facility], [place] [address], [place], [place] [address]    Culture   Final    MODERATE METHICILLIN RESISTANT STAPHYLOCOCCUS AUREUS FEW EIKENELLA CORRODENS Usually susceptible to penicillin and other beta lactam agents,quinolones,macrolides and tetracyclines. NO ANAEROBES ISOLATED; CULTURE IN PROGRESS FOR 5 DAYS    Report Status PENDING  Incomplete   Organism ID, Bacteria METHICILLIN RESISTANT STAPHYLOCOCCUS AUREUS  Final      Susceptibility   Methicillin resistant staphylococcus aureus - MIC*    CIPROFLOXACIN >=8 RESISTANT Resistant     ERYTHROMYCIN >=8 RESISTANT Resistant     GENTAMICIN <=0.5 SENSITIVE Sensitive     OXACILLIN >=4 RESISTANT Resistant     TETRACYCLINE >=16 RESISTANT Resistant     VANCOMYCIN <=0.5 SENSITIVE Sensitive     TRIMETH/SULFA 160 RESISTANT Resistant     CLINDAMYCIN <=0.25 SENSITIVE Sensitive     RIFAMPIN <=0.5 SENSITIVE Sensitive     Inducible Clindamycin NEGATIVE Sensitive     * MODERATE METHICILLIN RESISTANT STAPHYLOCOCCUS AUREUS  Culture, blood (routine x 2)     Status: None   Collection Time: 10/27/20  1:46 PM   Specimen: BLOOD  Result Value Ref Range Status   Specimen Description BLOOD RIGHT ANTECUBITAL  Final   Special Requests   Final    BOTTLES DRAWN AEROBIC AND ANAEROBIC Blood Culture adequate volume   Culture   Final    NO GROWTH 5 DAYS Performed at [name], [address], [place], [place] [address]    Report Status 11/01/2020 FINAL  Final  Culture, blood (routine x 2)      Status: None   Collection Time: 10/27/20  1:54 PM   Specimen: BLOOD RIGHT FOREARM  Result Value Ref Range Status   Specimen Description BLOOD RIGHT FOREARM  Final   Special Requests   Final    BOTTLES DRAWN AEROBIC ONLY Blood Culture adequate volume   Culture   Final    NO GROWTH 5 DAYS Performed at [facility], [place] [address] [place], [place] [address]    Report Status 11/01/2020 FINAL  Final    RADIOLOGY STUDIES/RESULTS: No results found.   LOS: 6 days   Signature  Lala Lund M.D on 11/01/2020 at 11:13 AM   -  To page go to [email or web address]

## 2020-11-01 NOTE — Interval H&P Note (Signed)
History and Physical Interval Note:  11/01/2020 11:23 AM  Kelly [name]  has presented today for surgery, with the diagnosis of BACTEREMIA, DRUG USE.  The various methods of treatment have been discussed with the patient and family. After consideration of risks, benefits and other options for treatment, the patient has consented to  Procedure(s): TRANSESOPHAGEAL ECHOCARDIOGRAM (TEE) (N/A) as a surgical intervention.  The patient's history has been reviewed, patient examined, no change in status, stable for surgery.  I have reviewed the patient's chart and labs.  Questions were answered to the patient's satisfaction.     Donato Heinz

## 2020-11-01 NOTE — Op Note (Signed)
11/01/2020  8:34 AM  PATIENT:  Kelly [name]  35 y.o. female  Patient Care Team: Pa, McConnell as PCP - General  PRE-OPERATIVE DIAGNOSIS:  Right thigh abscesses  POST-OPERATIVE DIAGNOSIS:  Same  PROCEDURE:   Incision and drainage of right thigh abscess, 2 x 2 cm involving subcutaneous fat Incision and drainage of right lateral thigh abscess, 2 x 2 cm, also involving subcutaneous fat  SURGEON:  Sharon Mt. Ahuva Poynor, MD  ANESTHESIA:   general  COUNTS: Sponge, needle and instrument counts were reported correct x2 at the conclusion of the operation.  EBL: 2 mL  DRAINS: None  SPECIMEN: None  COMPLICATIONS: None  FINDINGS: 2 separate right lateral thigh abscesses which are remote to her other abscesses we drained last week. Each are 2 x 2 cm in size. We did dressing changes of her other 3 abscesses our service drained last week at completion of the procedure.  DISPOSITION: PACU in satisfactory condition  DESCRIPTION: The patient was identified in preop holding and taken to the OR where she was placed on the operating room table. SCDs were placed. General endotracheal anesthesia was induced without difficulty. Pressure points were padded. She was then prepped and draped in the usual sterile fashion. A surgical timeout was performed indicating the correct patient, procedure, positioning and need for preoperative antibiotics.   She has 2 new abscesses on the right lateral thigh which are distinct from her prior abscesses.  They are both fluctuant.  Beginning with the cephalad abscess, a cruciate incision is created over the most fluctuant portion of the abscess.  Approximately 5 cc of pus freely flows from the wound.  The corners of the incision were trimmed to facilitate drainage.  Hemostasis is achieved with electrocautery.  The abscess is irrigated and all loculations were broken free.  Hemostasis is verified.  Attention is then turned to the more inferior abscess on  the right thigh. A cruciate incision is created over the most fluctuant portion of the abscess.  Approximately 5 cc of pus freely flows from the wound.  The corners of the incision were trimmed to facilitate drainage.  Hemostasis is achieved with electrocautery.  The abscess is irrigated and all loculations were broken free.  Hemostasis is verified.  Sponge, needle, instrument counts reported correct x2.  A dressing consisting of 4 x 4 and ABD pad is placed over the drainage site.  The other 3 abscesses our service during last week are also tended to.  Moist 4 x 4's followed by dry 4 x 4 and tape are placed.  All these abscesses appear to be healing appropriately with granulation tissue growing in.  She was then awakened from anesthesia, extubated, and transferred to a stretcher for transport to PACU in satisfactory condition.

## 2020-11-01 NOTE — Anesthesia Postprocedure Evaluation (Signed)
Anesthesia Post Note  Patient: Kelly [name]  Procedure(s) Performed: INCISION AND DRAINAGE ABSCESS RIGHT THIGH X 2 (Right: Leg Upper)     Patient location during evaluation: PACU Anesthesia Type: General Level of consciousness: awake and alert, oriented and patient cooperative Pain management: pain level controlled Vital Signs Assessment: post-procedure vital signs reviewed and stable Respiratory status: spontaneous breathing, nonlabored ventilation and respiratory function stable Cardiovascular status: blood pressure returned to baseline and stable Postop Assessment: no apparent nausea or vomiting Anesthetic complications: no   No notable events documented.  Last Vitals:  Vitals:   11/01/20 1051 11/01/20 1103  BP: 132/90   Pulse:    Resp: 14   Temp:  36.6 C  SpO2: 96%     Last Pain:  Vitals:   11/01/20 1103  TempSrc: Oral  PainSc:                  Pervis Hocking

## 2020-11-01 NOTE — Anesthesia Preprocedure Evaluation (Signed)
Anesthesia Evaluation  Patient identified by MRN, date of birth, ID band Patient awake    Reviewed: Allergy & Precautions, NPO status , Patient's Chart, lab work & pertinent test results  Airway Mallampati: I  TM Distance: >3 FB Neck ROM: Full    Dental  (+) Poor Dentition, Chipped, Dental Advisory Given,    Pulmonary former smoker,  Quit smoking 2020   Pulmonary exam normal breath sounds clear to auscultation       Cardiovascular hypertension, Pt. on medications +CHF  Normal cardiovascular exam Rhythm:Regular Rate:Normal  Echo 5/12 1. Left ventricular ejection fraction, by estimation, is 45 to 50%. The  left ventricle has mildly decreased function. The left ventricle has no  regional wall motion abnormalities. Left ventricular diastolic parameters  were normal. There is the  interventricular septum is flattened in systole and diastole, consistent  with right ventricular pressure and volume overload.  2. Right ventricular systolic function is moderately reduced. The right  ventricular size is severely enlarged. There is mildly elevated pulmonary  artery systolic pressure.  3. The mitral valve is normal in structure. Trivial mitral valve  regurgitation. No evidence of mitral stenosis.  4. Tricuspid leaflets do not coapt well. There is very severe central TR.  . The tricuspid valve is abnormal. Tricuspid valve regurgitation is  severe.  5. The aortic valve is normal in structure. Aortic valve regurgitation is  not visualized. No aortic stenosis is present.  6. The inferior vena cava is normal in size with greater than 50%  respiratory variability, suggesting right atrial pressure of 3 mmHg.    Neuro/Psych PSYCHIATRIC DISORDERS Anxiety Depression Bipolar Disorder negative neurological ROS     GI/Hepatic negative GI ROS, (+)     substance abuse (hx polysubstance abuse- cocaine, heroin, fentanyl)  cocaine use and IV  drug use, Hepatitis -, C  Endo/Other  negative endocrine ROS  Renal/GU Renal InsufficiencyRenal diseaseCr 1.17  negative genitourinary   Musculoskeletal  (+) Arthritis , Osteoarthritis,  narcotic dependentRecent epidural abscess and pregnancy- now s/p delivery and tubal   Abdominal (+) - obese,   Peds negative pediatric ROS (+)  Hematology  (+) Blood dyscrasia, anemia , hct 33.4   Anesthesia Other Findings   Reproductive/Obstetrics negative OB ROS U preg neg 6/15                             Anesthesia Physical  Anesthesia Plan  ASA: 3  Anesthesia Plan: MAC   Post-op Pain Management:    Induction: Intravenous  PONV Risk Score and Plan: 3 and Treatment may vary due to age or medical condition  Airway Management Planned: Natural Airway and Simple Face Mask  Additional Equipment: None  Intra-op Plan:   Post-operative Plan:   Informed Consent: I have reviewed the patients History and Physical, chart, labs and discussed the procedure including the risks, benefits and alternatives for the proposed anesthesia with the patient or authorized representative who has indicated his/her understanding and acceptance.     Dental advisory given  Plan Discussed with: CRNA and Anesthesiologist  Anesthesia Plan Comments:         Anesthesia Quick Evaluation

## 2020-11-01 NOTE — H&P (View-Only) (Signed)
PROGRESS NOTE        PATIENT DETAILS Name: Kelly [name] Age: 35 y.o. Sex: female Date of Birth: 1985-07-25 Admit Date: 10/26/2020 Admitting Physician Lequita Halt, MD PCP:[facility]  Brief Narrative: Patient is a 35 y.o. 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  She has a history of IV heroin/cocaine use-but claims she has been clean for several months.  UDS negative.  Significant events: 6/15>> admit for left breast/right thigh/left hand abscess.  Significant studies: 5/12>> TTE: EF 86-76%, RV systolic function moderately reduced.  Severe TR. 6/15>> CXR: No pneumonia 6/15>> x-ray left hand: No fracture/dislocation-dorsal soft tissue swelling  Antimicrobial therapy: Vancomycin: 6/15>>  Microbiology data: 6/15>> COVID/influenza PCR: Negative 6/15>> abscess cultures: Staff aureus (sensitivities pending) 6/16>> blood cultures: No growth  Procedures :  6/15>> I&D of left breast/right thigh/left thigh/left hand abscess by general surgery and orthopedics.  6/21 - by [facility] Incision and drainage of right thigh abscess, 2 x 2 cm involving subcutaneous fat Incision and drainage of right lateral thigh abscess, 2 x 2 cm, also involving subcutaneous fat  Consults:  General surgery, hand surgery  DVT Prophylaxis : enoxaparin (LOVENOX) injection 40 mg Start: 10/28/20 1630 SCD's Start: 10/27/20 0030   Subjective:  Patient in bed, sleepy post I&D, no chest- abdominal pain.   Assessment/Plan:  Left breast/right/left thigh and left hand abscess, now developing right thigh abscess: S/p I&D by general surgery/hand surgery-wound cultures positive for staph aureus-remains on IV Vancomycin and Unasyn.  ID also following.  Procalcitonin and blood cultures so far negative , unfortunately on 10/31/2020 she has  developed 2 new lesions on her right thigh.  Discussed with ID, TEE pending, general surgery post incision and drainage on 11/01/20.  History of recurrent tricuspid valve endocarditis (MSSA 2019, Enterococcus 2020, MRSA 2022): Concerned that she may have underlying endocarditis given her presentation-unfortunately blood cultures were done after patient received antibiotics-ID is following and directing antibiotics.  Repeat TTE stable, TEE later on 11/01/20.   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 difficile colitis - denies any diarrhea  History of syphilis: Stable for further treatment by ID MD outpatient setting  Recent vaginal birth/delivery: She is not breast-feeding-claims that her 36 month child is  with family members.  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  Diet:  Diet Order     None        Code Status:  Full code   Family Communication:  None at bedside  Disposition Plan:  Status is: Inpatient  Remains inpatient appropriate because:Inpatient level of care appropriate due to severity of illness  Dispo: The patient is from: Home              Anticipated d/c is to: Home              Patient currently is not medically stable to d/c.   Difficult to place patient No   Barriers to Discharge: Multiple abscesses-needs ongoing IV antibiotics-awaiting culture data.  Antimicrobial agents: Anti-infectives (From admission, onward)    Start     Dose/Rate Route Frequency Ordered Stop   10/30/20 1800  [MAR Hold]  vancomycin (VANCOCIN)  IVPB 750 mg/150 ml premix        (MAR Hold since Tue 11/01/2020 at 1047.Hold Reason: Transfer to a Procedural area)   750 mg 150 mL/hr over 60 Minutes Intravenous Every 12 hours 10/30/20 1018     10/30/20 0930  [MAR Hold]  Ampicillin-Sulbactam (UNASYN) 3 g in sodium chloride 0.9 % 100 mL IVPB        (MAR Hold since Tue 11/01/2020 at  1047.Hold Reason: Transfer to a Procedural area)   3 g 200 mL/hr over 30 Minutes Intravenous Every 8 hours 10/30/20 0838     10/27/20 0530  Vancomycin (VANCOCIN) 1,250 mg in sodium chloride 0.9 % 250 mL IVPB  Status:  Discontinued        1,250 mg 166.7 mL/hr over 90 Minutes Intravenous Every 12 hours 10/26/20 1932 10/30/20 1018   10/26/20 1730  vancomycin (VANCOREADY) IVPB 1750 mg/350 mL        1,750 mg 175 mL/hr over 120 Minutes Intravenous  Once 10/26/20 1712 10/26/20 1946        Time spent: 25 minutes-Greater than 50% of this time was spent in counseling, explanation of diagnosis, planning of further management, and coordination of care.  MEDICATIONS: Scheduled Meds:  [MAR Hold] acidophilus  2 capsule Oral TID   [MAR Hold] enoxaparin (LOVENOX) injection  40 mg Subcutaneous Q24H   [MAR Hold] furosemide  40 mg Oral Daily   [MAR Hold] methadone  130 mg Oral Daily   [MAR Hold] NIFEdipine  30 mg Oral Daily   [MAR Hold] prenatal vitamin w/FE, FA  1 tablet Oral Q1200   [MAR Hold] sertraline  50 mg Oral Daily   Continuous Infusions:  sodium chloride 500 mL (11/01/20 1059)   [MAR Hold] ampicillin-sulbactam (UNASYN) IV 3 g (11/01/20 0517)   [MAR Hold] vancomycin 750 mg (11/01/20 0550)   PRN Meds:.[MAR Hold] acetaminophen, [MAR Hold]  HYDROmorphone (DILAUDID) injection, [MAR Hold] methocarbamol   PHYSICAL EXAM: Vital signs: Vitals:   11/01/20 0915 11/01/20 0942 11/01/20 1051 11/01/20 1103  BP: 125/83 (!) 117/91 132/90   Pulse: (!) 57 (!) 58    Resp: (!) 8 18 14    Temp: 98.2 F (36.8 C) 98.4 F (36.9 C)  97.9 F (36.6 C)  TempSrc:  Oral  Oral  SpO2: 92% (!) 88% 96%   Weight:      Height:       Filed Weights   10/26/20 1649 11/01/20 0749  Weight: 85.8 kg 85.8 kg   Body mass index is 29.19 kg/m.   Gen Exam:  Awake Alert, No new F.N deficits, Normal affect [place].AT,PERRAL Supple Neck,No JVD, No cervical lymphadenopathy appriciated.  Symmetrical Chest wall movement,  Good air movement bilaterally, CTAB RRR,No Gallops, Rubs or new Murmurs, No Parasternal Heave +ve B.Sounds, Abd Soft, No tenderness, No organomegaly appriciated, No rebound - guarding or rigidity. L arm and hand under bandage, right thigh post I&D under dressing   I have personally reviewed following labs and imaging studies  LABORATORY DATA: CBC: Recent Labs  Lab 10/28/20 0038 10/29/20 0200 10/30/20 0530 10/31/20 0658 11/01/20 0331  WBC 8.9 8.6 8.4 9.5 9.0  NEUTROABS  --   --  5.0 6.1 5.7  HGB 8.5* 9.1* 9.3* 9.3* 10.2*  HCT 27.7* 30.0* 30.8* 30.7* 33.4*  MCV 91.4 93.5 91.9 90.6 90.0  PLT 511* 498* 545* 590* 613*    Basic Metabolic Panel: Recent Labs  Lab 10/28/20 0038 10/29/20 0200 10/30/20 0530 10/31/20 0658 11/01/20 0331  NA 139 139  137 137 137  K 3.9 4.2 4.4 4.3 4.7  CL 100 102 99 95* 97*  CO2 31 29 28 31 31   GLUCOSE 90 99 93 105* 102*  BUN 14 16 15 18 20   CREATININE 0.97 0.87 0.87 0.99 1.17*  CALCIUM 8.5* 8.7* 9.2 9.5 9.5  MG 1.7 2.1 2.0 2.0 2.2    GFR: Estimated Creatinine Clearance: 75.6 mL/min (A) (by C-G formula based on SCr of 1.17 mg/dL (H)).  Liver Function Tests: Recent Labs  Lab 10/26/20 1756 10/28/20 0038 10/30/20 0530 10/31/20 0658 11/01/20 0331  AST 15 11* 12* 11* 13*  ALT 7 7 6 6 6   ALKPHOS 95 100 99 97 106  BILITOT 0.6 0.6 0.7 0.6 0.4  PROT 6.1* 5.9* 6.5 6.6 7.4  ALBUMIN 2.4* 2.3* 2.6* 2.5* 2.8*   No results for input(s): LIPASE, AMYLASE in the last 168 hours. No results for input(s): AMMONIA in the last 168 hours.  Coagulation Profile: No results for input(s): INR, PROTIME in the last 168 hours.  Cardiac Enzymes: No results for input(s): CKTOTAL, CKMB, CKMBINDEX, TROPONINI in the last 168 hours.  BNP (last 3 results) No results for input(s): PROBNP in the last 8760 hours.  Lipid Profile: No results for input(s): CHOL, HDL, LDLCALC, TRIG, CHOLHDL, LDLDIRECT in the last 72 hours.  Thyroid Function Tests: No results for  input(s): TSH, T4TOTAL, FREET4, T3FREE, THYROIDAB in the last 72 hours.  Anemia Panel: No results for input(s): VITAMINB12, FOLATE, FERRITIN, TIBC, IRON, RETICCTPCT in the last 72 hours.  Urine analysis:    Component Value Date/Time   COLORURINE AMBER (A) 07/13/2020 1320   APPEARANCEUR CLOUDY (A) 07/13/2020 1320   APPEARANCEUR Hazy 07/10/2013 1121   LABSPEC 1.028 07/13/2020 1320   LABSPEC 1.020 07/10/2013 1121   PHURINE 5.0 07/13/2020 1320   GLUCOSEU NEGATIVE 07/13/2020 1320   GLUCOSEU Negative 07/10/2013 1121   HGBUR SMALL (A) 07/13/2020 1320   BILIRUBINUR NEGATIVE 07/13/2020 1320   BILIRUBINUR Negative 07/10/2013 1121   KETONESUR 5 (A) 07/13/2020 1320   PROTEINUR 100 (A) 07/13/2020 1320   UROBILINOGEN 1.0 02/15/2015 0530   NITRITE POSITIVE (A) 07/13/2020 1320   LEUKOCYTESUR LARGE (A) 07/13/2020 1320   LEUKOCYTESUR Trace 07/10/2013 1121    Sepsis Labs: Lactic Acid, Venous    Component Value Date/Time   LATICACIDVEN 1.5 07/14/2020 0021    MICROBIOLOGY: Recent Results (from the past 240 hour(s))  Resp Panel by RT-PCR (Flu A&B, Covid) Nasopharyngeal Swab     Status: None   Collection Time: 10/26/20  7:05 PM   Specimen: Nasopharyngeal Swab; Nasopharyngeal(NP) swabs in vial transport medium  Result Value Ref Range Status   SARS Coronavirus 2 by RT PCR NEGATIVE NEGATIVE Final    Comment: (NOTE) SARS-CoV-2 target nucleic acids are NOT DETECTED.  The SARS-CoV-2 RNA is generally detectable in upper respiratory specimens during the acute phase of infection. The lowest concentration of SARS-CoV-2 viral copies this assay can detect is 138 copies/mL. A negative result does not preclude SARS-Cov-2 infection and should not be used as the sole basis for treatment or other patient management decisions. A negative result may occur with  improper specimen collection/handling, submission of specimen other than nasopharyngeal swab, presence of viral mutation(s) within the areas  targeted by this assay, and inadequate number of viral copies(<138 copies/mL). A negative result must be combined with clinical observations, patient history, and epidemiological information. The expected result is Negative.  Fact Sheet for Patients:  EntrepreneurPulse.com.au  Fact Sheet for Healthcare Providers:  IncredibleEmployment.be  This test is no t yet approved or cleared by the [place] and  has been authorized for detection and/or diagnosis of SARS-CoV-2 by FDA under an Emergency Use Authorization (EUA). This EUA will remain  in effect (meaning this test can be used) for the duration of the COVID-19 declaration under Section 564(b)(1) of the Act, 21 U.S.C.section 360bbb-3(b)(1), unless the authorization is terminated  or revoked sooner.       Influenza A by PCR NEGATIVE NEGATIVE Final   Influenza B by PCR NEGATIVE NEGATIVE Final    Comment: (NOTE) The Xpert Xpress SARS-CoV-2/FLU/RSV plus assay is intended as an aid in the diagnosis of influenza from Nasopharyngeal swab specimens and should not be used as a sole basis for treatment. Nasal washings and aspirates are unacceptable for Xpert Xpress SARS-CoV-2/FLU/RSV testing.  Fact Sheet for Patients: EntrepreneurPulse.com.au  Fact Sheet for Healthcare Providers: IncredibleEmployment.be  This test is not yet approved or cleared by the [place] FDA and has been authorized for detection and/or diagnosis of SARS-CoV-2 by FDA under an Emergency Use Authorization (EUA). This EUA will remain in effect (meaning this test can be used) for the duration of the COVID-19 declaration under Section 564(b)(1) of the Act, 21 U.S.C. section 360bbb-3(b)(1), unless the authorization is terminated or revoked.  Performed at [facility], [place] [address], [place], [place] [address]   Surgical pcr screen     Status: Abnormal   Collection Time:  10/26/20  7:06 PM   Specimen: Nasal Mucosa; Nasal Swab  Result Value Ref Range Status   MRSA, PCR POSITIVE (A) NEGATIVE Final    Comment: RESULT CALLED TO, READ BACK BY AND VERIFIED WITH: A. SOUTZ RN, AT [phone] 10/27/20 D. VANHOOK    Staphylococcus aureus POSITIVE (A) NEGATIVE Final    Comment: (NOTE) The Xpert SA Assay (FDA approved for NASAL specimens in patients 16 years of age and older), is one component of a comprehensive surveillance program. It is not intended to diagnose infection nor to guide or monitor treatment. Performed at [facility], [place] [address], [place], [place] [address]   Aerobic/Anaerobic Culture w Gram Stain (surgical/deep wound)     Status: None (Preliminary result)   Collection Time: 10/26/20  9:40 PM   Specimen: Abscess  Result Value Ref Range Status   Specimen Description ABSCESS  Final   Special Requests NONE  Final   Gram Stain   Final    MODERATE WBC PRESENT, PREDOMINANTLY PMN RARE GRAM POSITIVE COCCI IN CLUSTERS Performed at [facility], [place] [address], [place],  [address]    Culture   Final    RARE METHICILLIN RESISTANT STAPHYLOCOCCUS AUREUS RARE EIKENELLA CORRODENS Usually susceptible to penicillin and other beta lactam agents,quinolones,macrolides and tetracyclines. NO ANAEROBES ISOLATED; CULTURE IN PROGRESS FOR 5 DAYS    Report Status PENDING  Incomplete   Organism ID, Bacteria METHICILLIN RESISTANT STAPHYLOCOCCUS AUREUS  Final      Susceptibility   Methicillin resistant staphylococcus aureus - MIC*    CIPROFLOXACIN >=8 RESISTANT Resistant     ERYTHROMYCIN >=8 RESISTANT Resistant     GENTAMICIN <=0.5 SENSITIVE Sensitive     OXACILLIN >=4 RESISTANT Resistant     TETRACYCLINE >=16 RESISTANT Resistant     VANCOMYCIN <=0.5 SENSITIVE Sensitive     TRIMETH/SULFA >=320 RESISTANT Resistant     CLINDAMYCIN <=0.25 SENSITIVE Sensitive     RIFAMPIN <=0.5 SENSITIVE Sensitive     Inducible Clindamycin NEGATIVE Sensitive     *  RARE  METHICILLIN RESISTANT STAPHYLOCOCCUS AUREUS  Aerobic/Anaerobic Culture w Gram Stain (surgical/deep wound)     Status: None (Preliminary result)   Collection Time: 10/26/20  9:44 PM   Specimen: PATH Other; Tissue  Result Value Ref Range Status   Specimen Description ABSCESS  Final   Special Requests LEFT  Final   Gram Stain   Final    ABUNDANT WBC PRESENT, PREDOMINANTLY PMN FEW GRAM POSITIVE COCCI IN CLUSTERS MODERATE GRAM POSITIVE RODS Performed at [facility], [place] [address], [place], [place] [address]    Culture   Final    MODERATE METHICILLIN RESISTANT STAPHYLOCOCCUS AUREUS FEW EIKENELLA CORRODENS Usually susceptible to penicillin and other beta lactam agents,quinolones,macrolides and tetracyclines. NO ANAEROBES ISOLATED; CULTURE IN PROGRESS FOR 5 DAYS    Report Status PENDING  Incomplete   Organism ID, Bacteria METHICILLIN RESISTANT STAPHYLOCOCCUS AUREUS  Final      Susceptibility   Methicillin resistant staphylococcus aureus - MIC*    CIPROFLOXACIN >=8 RESISTANT Resistant     ERYTHROMYCIN >=8 RESISTANT Resistant     GENTAMICIN <=0.5 SENSITIVE Sensitive     OXACILLIN >=4 RESISTANT Resistant     TETRACYCLINE >=16 RESISTANT Resistant     VANCOMYCIN <=0.5 SENSITIVE Sensitive     TRIMETH/SULFA 160 RESISTANT Resistant     CLINDAMYCIN <=0.25 SENSITIVE Sensitive     RIFAMPIN <=0.5 SENSITIVE Sensitive     Inducible Clindamycin NEGATIVE Sensitive     * MODERATE METHICILLIN RESISTANT STAPHYLOCOCCUS AUREUS  Culture, blood (routine x 2)     Status: None   Collection Time: 10/27/20  1:46 PM   Specimen: BLOOD  Result Value Ref Range Status   Specimen Description BLOOD RIGHT ANTECUBITAL  Final   Special Requests   Final    BOTTLES DRAWN AEROBIC AND ANAEROBIC Blood Culture adequate volume   Culture   Final    NO GROWTH 5 DAYS Performed at [name], [address] [place], [place] [address]    Report Status 11/01/2020 FINAL  Final  Culture, blood (routine x 2)      Status: None   Collection Time: 10/27/20  1:54 PM   Specimen: BLOOD RIGHT FOREARM  Result Value Ref Range Status   Specimen Description BLOOD RIGHT FOREARM  Final   Special Requests   Final    BOTTLES DRAWN AEROBIC ONLY Blood Culture adequate volume   Culture   Final    NO GROWTH 5 DAYS Performed at [facility], [place] [address], [place], [place] [address]    Report Status 11/01/2020 FINAL  Final    RADIOLOGY STUDIES/RESULTS: No results found.   LOS: 6 days   Signature  Lala Lund M.D on 11/01/2020 at 11:13 AM   -  To page go to [email or web address]

## 2020-11-01 NOTE — Anesthesia Procedure Notes (Signed)
Procedure Name: MAC Date/Time: 11/01/2020 11:36 AM Performed by: Kathryne Hitch, CRNA Pre-anesthesia Checklist: Patient identified, Emergency Drugs available, Suction available and Patient being monitored Patient Re-evaluated:Patient Re-evaluated prior to induction Oxygen Delivery Method: Nasal cannula Preoxygenation: Pre-oxygenation with 100% oxygen Induction Type: IV induction Placement Confirmation: positive ETCO2 Dental Injury: Teeth and Oropharynx as per pre-operative assessment

## 2020-11-01 NOTE — Anesthesia Procedure Notes (Signed)
Procedure Name: LMA Insertion Date/Time: 11/01/2020 8:19 AM Performed by: Dorthea Cove, CRNA Pre-anesthesia Checklist: Patient identified, Emergency Drugs available, Suction available and Patient being monitored Patient Re-evaluated:Patient Re-evaluated prior to induction Oxygen Delivery Method: Circle System Utilized Preoxygenation: Pre-oxygenation with 100% oxygen Induction Type: IV induction Ventilation: Mask ventilation without difficulty LMA: LMA inserted LMA Size: 4.0 Number of attempts: 1 Airway Equipment and Method: Bite block Placement Confirmation: positive ETCO2 Tube secured with: Tape Dental Injury: Teeth and Oropharynx as per pre-operative assessment

## 2020-11-01 NOTE — Transfer of Care (Signed)
Immediate Anesthesia Transfer of Care Note  Patient: Kelly [name]  Procedure(s) Performed: INCISION AND DRAINAGE ABSCESS RIGHT THIGH X 2 (Right: Leg Upper)  Patient Location: PACU  Anesthesia Type:General  Level of Consciousness: awake, alert  and oriented  Airway & Oxygen Therapy: Patient Spontanous Breathing and Patient connected to face mask oxygen  Post-op Assessment: Report given to RN and Post -op Vital signs reviewed and stable  Post vital signs: Reviewed and stable  Last Vitals:  Vitals Value Taken Time  BP 121/80 11/01/20 0902  Temp 36.7 C 11/01/20 0845  Pulse 51 11/01/20 0902  Resp 9 11/01/20 0902  SpO2 96 % 11/01/20 0902  Vitals shown include unvalidated device data.  Last Pain:  Vitals:   11/01/20 0900  TempSrc:   PainSc: Asleep      Patients Stated Pain Goal: 3 (38/82/66 6648)  Complications: No notable events documented.

## 2020-11-01 NOTE — CV Procedure (Addendum)
     TRANSESOPHAGEAL ECHOCARDIOGRAM   NAME:  Kelly [name]   MRN: [id number] DOB:  [birth year]   ADMIT DATE: 10/26/2020  INDICATIONS: Evaluate for endocarditis  PROCEDURE:   Informed consent was obtained prior to the procedure. The risks, benefits and alternatives for the procedure were discussed and the patient comprehended these risks.  Risks include, but are not limited to, cough, sore throat, vomiting, nausea, somnolence, esophageal and stomach trauma or perforation, bleeding, low blood pressure, aspiration, pneumonia, infection, trauma to the teeth and death.    After a procedural time-out, the oropharynx was anesthetized and the patient was sedated by the anesthesia service. The transesophageal probe was inserted in the esophagus and stomach without difficulty and multiple views were obtained. Anesthesia was monitored by Dr Ambrose Pancoast and Haze Boyden, CRNA.    COMPLICATIONS:    There were no immediate complications.  FINDINGS:  No new vegetation seen.  Does have mobile calcified mass on ventricular side of tricuspid valve, suspect healed vegetation versus torn chord from prior endocarditis.  Torrential TR.  LVEF 40-45%.  Severe RV enlargement, moderate systolic dysfunction.  Positive bubble study suggesting PFO.  Oswaldo Milian MD [place] [facility]  [address], [place], [place] [address] [phone]   3:29 PM

## 2020-11-01 NOTE — Transfer of Care (Signed)
Immediate Anesthesia Transfer of Care Note  Patient: Donyelle N Bedoy  Procedure(s) Performed: TRANSESOPHAGEAL ECHOCARDIOGRAM (TEE) BUBBLE STUDY  Patient Location: Endoscopy Unit  Anesthesia Type:MAC  Level of Consciousness: drowsy and patient cooperative  Airway & Oxygen Therapy: Patient Spontanous Breathing and Patient connected to nasal cannula oxygen  Post-op Assessment: Report given to RN and Post -op Vital signs reviewed and stable  Post vital signs: Reviewed and stable  Last Vitals:  Vitals Value Taken Time  BP 120/82   Temp    Pulse 67 11/01/20 1218  Resp 11 11/01/20 1218  SpO2 90 % 11/01/20 1218  Vitals shown include unvalidated device data.  Last Pain:  Vitals:   11/01/20 1103  TempSrc: Oral  PainSc:       Patients Stated Pain Goal: 3 (99/24/26 8341)  Complications: No notable events documented.

## 2020-11-02 ENCOUNTER — Encounter (HOSPITAL_COMMUNITY): Payer: Self-pay | Admitting: Surgery

## 2020-11-02 ENCOUNTER — Ambulatory Visit: Payer: Medicaid Other | Admitting: Obstetrics and Gynecology

## 2020-11-02 LAB — CBC WITH DIFFERENTIAL/PLATELET
Abs Immature Granulocytes: 0.05 10*3/uL (ref 0.00–0.07)
Basophils Absolute: 0 10*3/uL (ref 0.0–0.1)
Basophils Relative: 0 %
Eosinophils Absolute: 0 10*3/uL (ref 0.0–0.5)
Eosinophils Relative: 0 %
HCT: 33.5 % — ABNORMAL LOW (ref 36.0–46.0)
Hemoglobin: 10.1 g/dL — ABNORMAL LOW (ref 12.0–15.0)
Immature Granulocytes: 1 %
Lymphocytes Relative: 24 %
Lymphs Abs: 2.5 10*3/uL (ref 0.7–4.0)
MCH: 27.5 pg (ref 26.0–34.0)
MCHC: 30.1 g/dL (ref 30.0–36.0)
MCV: 91.3 fL (ref 80.0–100.0)
Monocytes Absolute: 0.5 10*3/uL (ref 0.1–1.0)
Monocytes Relative: 5 %
Neutro Abs: 7.5 10*3/uL (ref 1.7–7.7)
Neutrophils Relative %: 70 %
Platelets: 630 10*3/uL — ABNORMAL HIGH (ref 150–400)
RBC: 3.67 MIL/uL — ABNORMAL LOW (ref 3.87–5.11)
RDW: 15.6 % — ABNORMAL HIGH (ref 11.5–15.5)
WBC: 10.6 10*3/uL — ABNORMAL HIGH (ref 4.0–10.5)
nRBC: 0 % (ref 0.0–0.2)

## 2020-11-02 LAB — COMPREHENSIVE METABOLIC PANEL
ALT: 5 U/L (ref 0–44)
AST: 11 U/L — ABNORMAL LOW (ref 15–41)
Albumin: 2.9 g/dL — ABNORMAL LOW (ref 3.5–5.0)
Alkaline Phosphatase: 90 U/L (ref 38–126)
Anion gap: 12 (ref 5–15)
BUN: 21 mg/dL — ABNORMAL HIGH (ref 6–20)
CO2: 30 mmol/L (ref 22–32)
Calcium: 9.5 mg/dL (ref 8.9–10.3)
Chloride: 93 mmol/L — ABNORMAL LOW (ref 98–111)
Creatinine, Ser: 1.08 mg/dL — ABNORMAL HIGH (ref 0.44–1.00)
GFR, Estimated: >60 mL/min (ref >60)
Glucose, Bld: 107 mg/dL — ABNORMAL HIGH (ref 70–99)
Potassium: 4.7 mmol/L (ref 3.5–5.1)
Sodium: 135 mmol/L (ref 135–145)
Total Bilirubin: 0.4 mg/dL (ref 0.3–1.2)
Total Protein: 7.2 g/dL (ref 6.5–8.1)

## 2020-11-02 LAB — MAGNESIUM: Magnesium: 2.2 mg/dL (ref 1.7–2.4)

## 2020-11-02 LAB — PROCALCITONIN: Procalcitonin: <0.1 ng/mL

## 2020-11-02 LAB — C-REACTIVE PROTEIN: CRP: 0.9 mg/dL (ref <1.0)

## 2020-11-02 MED ORDER — SODIUM CHLORIDE 0.9 % IV SOLN
INTRAVENOUS | Status: DC | PRN
  Administered 2020-11-03: 250 mL via INTRAVENOUS

## 2020-11-02 MED ORDER — FUROSEMIDE 40 MG PO TABS: 40.0000 mg | ORAL_TABLET | Freq: Every day | ORAL | Status: DC

## 2020-11-02 NOTE — Progress Notes (Signed)
[facility] for Infectious Disease   Reason for visit: Follow up on multiple abscesses  Interval History: TEE done yesterday and no new vegetation noted.  Blood cultures have remained negative. MRSA in both skin abscess cultures and Eikenella in one culture as well Vancomycin and Augmentin  Physical Exam: Constitutional:  Vitals:   11/02/20 0442 11/02/20 0723  BP: 106/76 99/75  Pulse: 61 (!) 57  Resp: 18 20  Temp: 98.6 F (37 C) 97.9 F (36.6 C)  SpO2: 93% 92%   patient appears in NAD Respiratory: Normal respiratory effort; CTA B Cardiovascular: RRR MS: right leg wrapped Skin: no rashes  Review of Systems: Constitutional: negative for fevers and chills Gastrointestinal: negative for diarrhea  Lab Results  Component Value Date   WBC 10.6 (H) 11/02/2020   HGB 10.1 (L) 11/02/2020   HCT 33.5 (L) 11/02/2020   MCV 91.3 11/02/2020   PLT 630 (H) 11/02/2020    Lab Results  Component Value Date   CREATININE 1.08 (H) 11/02/2020   BUN 21 (H) 11/02/2020   NA 135 11/02/2020   K 4.7 11/02/2020   CL 93 (L) 11/02/2020   CO2 30 11/02/2020    Lab Results  Component Value Date   ALT 5 11/02/2020   AST 11 (L) 11/02/2020   ALKPHOS 90 11/02/2020     Microbiology: Recent Results (from the past 240 hour(s))  Resp Panel by RT-PCR (Flu A&B, Covid) Nasopharyngeal Swab     Status: None   Collection Time: 10/26/20  7:05 PM   Specimen: Nasopharyngeal Swab; Nasopharyngeal(NP) swabs in vial transport medium  Result Value Ref Range Status   SARS Coronavirus 2 by RT PCR NEGATIVE NEGATIVE Final    Comment: (NOTE) SARS-CoV-2 target nucleic acids are NOT DETECTED.  The SARS-CoV-2 RNA is generally detectable in upper respiratory specimens during the acute phase of infection. The lowest concentration of SARS-CoV-2 viral copies this assay can detect is 138 copies/mL. A negative result does not preclude SARS-Cov-2 infection and should not be used as the sole basis for treatment  or other patient management decisions. A negative result may occur with  improper specimen collection/handling, submission of specimen other than nasopharyngeal swab, presence of viral mutation(s) within the areas targeted by this assay, and inadequate number of viral copies(<138 copies/mL). A negative result must be combined with clinical observations, patient history, and epidemiological information. The expected result is Negative.  Fact Sheet for Patients:  EntrepreneurPulse.com.au  Fact Sheet for Healthcare Providers:  IncredibleEmployment.be  This test is no t yet approved or cleared by the [place] FDA and  has been authorized for detection and/or diagnosis of SARS-CoV-2 by FDA under an Emergency Use Authorization (EUA). This EUA will remain  in effect (meaning this test can be used) for the duration of the COVID-19 declaration under Section 564(b)(1) of the Act, 21 U.S.C.section 360bbb-3(b)(1), unless the authorization is terminated  or revoked sooner.       Influenza A by PCR NEGATIVE NEGATIVE Final   Influenza B by PCR NEGATIVE NEGATIVE Final    Comment: (NOTE) The Xpert Xpress SARS-CoV-2/FLU/RSV plus assay is intended as an aid in the diagnosis of influenza from Nasopharyngeal swab specimens and should not be used as a sole basis for treatment. Nasal washings and aspirates are unacceptable for Xpert Xpress SARS-CoV-2/FLU/RSV testing.  Fact Sheet for Patients: EntrepreneurPulse.com.au  Fact Sheet for Healthcare Providers: IncredibleEmployment.be  This test is not yet approved or cleared by the [place] and has been authorized  for detection and/or diagnosis of SARS-CoV-2 by FDA under an Emergency Use Authorization (EUA). This EUA will remain in effect (meaning this test can be used) for the duration of the COVID-19 declaration under Section 564(b)(1) of the Act, 21 U.S.C. section  360bbb-3(b)(1), unless the authorization is terminated or revoked.  Performed at [facility], [place] [address] [place], [place] [address]   Surgical pcr screen     Status: Abnormal   Collection Time: 10/26/20  7:06 PM   Specimen: Nasal Mucosa; Nasal Swab  Result Value Ref Range Status   MRSA, PCR POSITIVE (A) NEGATIVE Final    Comment: RESULT CALLED TO, READ BACK BY AND VERIFIED WITH: A. SOUTZ RN, AT [phone] 10/27/20 D. VANHOOK    Staphylococcus aureus POSITIVE (A) NEGATIVE Final    Comment: (NOTE) The Xpert SA Assay (FDA approved for NASAL specimens in patients 59 years of age and older), is one component of a comprehensive surveillance program. It is not intended to diagnose infection nor to guide or monitor treatment. Performed at [facility], [place] [address], [place], [place] [address]   Aerobic/Anaerobic Culture w Gram Stain (surgical/deep wound)     Status: None   Collection Time: 10/26/20  9:40 PM   Specimen: Abscess  Result Value Ref Range Status   Specimen Description ABSCESS  Final   Special Requests NONE  Final   Gram Stain   Final    MODERATE WBC PRESENT, PREDOMINANTLY PMN RARE GRAM POSITIVE COCCI IN CLUSTERS    Culture   Final    RARE METHICILLIN RESISTANT STAPHYLOCOCCUS AUREUS RARE EIKENELLA CORRODENS Usually susceptible to penicillin and other beta lactam agents,quinolones,macrolides and tetracyclines. NO ANAEROBES ISOLATED Performed at [facility], [place] [address] [place], [place] [address]    Report Status 11/01/2020 FINAL  Final   Organism ID, Bacteria METHICILLIN RESISTANT STAPHYLOCOCCUS AUREUS  Final      Susceptibility   Methicillin resistant staphylococcus aureus - MIC*    CIPROFLOXACIN >=8 RESISTANT Resistant     ERYTHROMYCIN >=8 RESISTANT Resistant     GENTAMICIN <=0.5 SENSITIVE Sensitive     OXACILLIN >=4 RESISTANT Resistant     TETRACYCLINE >=16 RESISTANT Resistant     VANCOMYCIN <=0.5 SENSITIVE Sensitive     TRIMETH/SULFA  >=320 RESISTANT Resistant     CLINDAMYCIN <=0.25 SENSITIVE Sensitive     RIFAMPIN <=0.5 SENSITIVE Sensitive     Inducible Clindamycin NEGATIVE Sensitive     * RARE METHICILLIN RESISTANT STAPHYLOCOCCUS AUREUS  Aerobic/Anaerobic Culture w Gram Stain (surgical/deep wound)     Status: None   Collection Time: 10/26/20  9:44 PM   Specimen: PATH Other; Tissue  Result Value Ref Range Status   Specimen Description ABSCESS  Final   Special Requests LEFT  Final   Gram Stain   Final    ABUNDANT WBC PRESENT, PREDOMINANTLY PMN FEW GRAM POSITIVE COCCI IN CLUSTERS MODERATE GRAM POSITIVE RODS    Culture   Final    MODERATE METHICILLIN RESISTANT STAPHYLOCOCCUS AUREUS MODERATE EIKENELLA CORRODENS Usually susceptible to penicillin and other beta lactam agents,quinolones,macrolides and tetracyclines. NO ANAEROBES ISOLATED Performed at [facility], [place] [address] [place], [place] [address]    Report Status 11/01/2020 FINAL  Final   Organism ID, Bacteria METHICILLIN RESISTANT STAPHYLOCOCCUS AUREUS  Final      Susceptibility   Methicillin resistant staphylococcus aureus - MIC*    CIPROFLOXACIN >=8 RESISTANT Resistant     ERYTHROMYCIN >=8 RESISTANT Resistant     GENTAMICIN <=  0.5 SENSITIVE Sensitive     OXACILLIN >=4 RESISTANT Resistant     TETRACYCLINE >=16 RESISTANT Resistant     VANCOMYCIN <=0.5 SENSITIVE Sensitive     TRIMETH/SULFA 160 RESISTANT Resistant     CLINDAMYCIN <=0.25 SENSITIVE Sensitive     RIFAMPIN <=0.5 SENSITIVE Sensitive     Inducible Clindamycin NEGATIVE Sensitive     * MODERATE METHICILLIN RESISTANT STAPHYLOCOCCUS AUREUS  Culture, blood (routine x 2)     Status: None   Collection Time: 10/27/20  1:46 PM   Specimen: BLOOD  Result Value Ref Range Status   Specimen Description BLOOD RIGHT ANTECUBITAL  Final   Special Requests   Final    BOTTLES DRAWN AEROBIC AND ANAEROBIC Blood Culture adequate volume   Culture   Final    NO GROWTH 5 DAYS Performed at [facility], [place] [address] [place], [place] [address]    Report Status 11/01/2020 FINAL  Final  Culture, blood (routine x 2)     Status: None   Collection Time: 10/27/20  1:54 PM   Specimen: BLOOD RIGHT FOREARM  Result Value Ref Range Status   Specimen Description BLOOD RIGHT FOREARM  Final   Special Requests   Final    BOTTLES DRAWN AEROBIC ONLY Blood Culture adequate volume   Culture   Final    NO GROWTH 5 DAYS Performed at [facility], [place] [address] [place], [place] [address]    Report Status 11/01/2020 FINAL  Final    Impression/Plan:  1. Skin abscesses - multiple and with MRSA on vancomycin.  Also Eikenella and on amp/sulbactam.   MRSA resistant to doxycycline, Bactrim. I recommend 2 more weeks of treatment and can use oral linezolid and oral Augmentin at discharge through July 5.    2.  TV regurgitation - severe/torrential and will need follow up with [facility] regarding options/need for valve replacement as an outpatient, once current infection is resolved.  Also PFO noted.    3.  Heart failure - reduced EF, fluid retention.  Will need cardiology follow up.    4.  High risk sexual activity - consideration of PrEP with her history of syphilis.  Will discuss at follow up.    She has follow up with me 7/5 3:15 pm

## 2020-11-02 NOTE — Progress Notes (Signed)
Pharmacy Antibiotic Note  Kelly [name] is a 35 y.o. female admitted on 10/26/2020 with multiple abscesses, now s/p I&D.  Pharmacy has been consulted for Vancomycin and Unasyn dosing.  Today is abx day #7.    Noted plan to discharge on oral antibiotics, Linezolid and Augmentin.  Will defer levels at this time.  Plan: Continue current antibiotics. Vancomycin 721m IV q12h Unasyn 3gm IV q8h. Follow-up plans / readiness for oral antibiotics.  Height: 5' 7.5" (171.5 cm) Weight: 85.8 kg (189 lb 2.5 oz) IBW/kg (Calculated) : 62.75  Temp (24hrs), Avg:98.2 F (36.8 C), Min:97.9 F (36.6 C), Max:98.6 F (37 C)  Recent Labs  Lab 10/29/20 0200 10/29/20 2123 10/30/20 0530 10/31/20 0658 11/01/20 0331 11/02/20 0130  WBC 8.6  --  8.4 9.5 9.0 10.6*  CREATININE 0.87  --  0.87 0.99 1.17* 1.08*  VANCOTROUGH  --   --  22*  --   --   --   VANCOPEAK  --  43*  --   --   --   --     Estimated Creatinine Clearance: 81.9 mL/min (A) (by C-G formula based on SCr of 1.08 mg/dL (H)).    Allergies  Allergen Reactions   Hydrocodone Itching   Morphine And Related Nausea And Vomiting    Antimicrobials this admission:   Dose adjustments this admission: Vanc 6/15 >> Unasyn 6/19 >>  Microbiology results:  6/15 abscess L (deep): mod MRSA  6/15 abscess (deep): rare MRSA, few E. Corrodens  6/16 blood x 2 (after abx begun):  neg  6/15 MRSA PCR: positive  6/15 COVID and flu: neg  Thank you for allowing pharmacy to be a part of this patient's care.  [employer] Pharm.D., BCPS Clinical Pharmacist Clinical phone for 11/02/2020 from 8:30-4:00 is [phone]  **Pharmacist phone directory can be found on [email or web address] listed under [place]  11/02/2020 1:18 PM

## 2020-11-02 NOTE — TOC Progression Note (Signed)
Transition of Care [facility]) - Progression Note    Patient Details  Name: Kelly [name] MRN: [id number] Date of Birth: 1985-10-31  Transition of Care [facility]) CM/SW [place], LCSW Phone Number: 11/02/2020, 5:45 PM  Clinical Narrative:    CSW spoke with patient. She is requesting assistance contacting facilities as she does not have a phone and the room phone does not call out of area numbers. CSW will see if nursing staff can lend patient a phone. CSW will follow up with [facility] about a referral; patient stated she thought they only keep patients for five days.     Expected Discharge Plan: Home/Self Care Barriers to Discharge: Continued Medical Work up  Expected Discharge Plan and Services Expected Discharge Plan: Home/Self Care In-house Referral: Clinical Social Work Discharge Planning Services: CM Consult   Living arrangements for the past 2 months: [place] Determinants of Health (SDOH) Interventions    Readmission Risk Interventions Readmission Risk Prevention Plan 08/25/2020 07/30/2018  Transportation Screening Complete Complete  Transportation Screening Comment Rooms at the [facility] to provide transportation for the patient. -  PCP or Specialist Appt within 5-7 Days Complete -  Not Complete comments 08/29/20 OB/GYN appointment scheduled -  Home Care Screening Complete -  Medication Review (RN Care Manager) - Complete  PCP or Specialist appointment within 3-5 days of discharge - Complete  HRI or [place] - Not Complete  HRI or Home Care Consult Pt Refusal Comments - N/A  SW Recovery Care/Counseling Consult - Complete  Palliative Care Screening - Not Complete  Comments - N/A  Skilled Nursing Facility - Not Complete  SNF Comments - N/A  Some recent data might be hidden

## 2020-11-02 NOTE — Progress Notes (Signed)
PROGRESS NOTE        PATIENT DETAILS Name: Kelly [name] Age: 35 y.o. Sex: female Date of Birth: 1986-03-25 Admit Date: 10/26/2020 Admitting Physician Lequita Halt, MD PCP:[facility], [facility]  Brief Narrative: Patient is a 35 y.o. 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  She has a history of IV heroin/cocaine use-but claims she has been clean for several months.  UDS negative.  Significant events: 6/15>> admit for left breast/right thigh/left hand abscess.  Significant studies: 5/12>> TTE: EF 40-98%, RV systolic function moderately reduced.  Severe TR. 6/15>> CXR: No pneumonia 6/15>> x-ray left hand: No fracture/dislocation-dorsal soft tissue swelling  Antimicrobial therapy: Vancomycin: 6/15>>  Microbiology data: 6/15>> COVID/influenza PCR: Negative 6/15>> abscess cultures: Staff aureus (sensitivities pending) 6/16>> blood cultures: No growth  Procedures :  6/15>> I&D of left breast/right thigh/left thigh/left hand abscess by general surgery and orthopedics.  6/21 - by [facility] Incision and drainage of right thigh abscess, 2 x 2 cm involving subcutaneous fat Incision and drainage of right lateral thigh abscess, 2 x 2 cm, also involving subcutaneous fat  6/21 - TEE -  1. Left ventricular ejection fraction, by estimation, is 40 to 45%. The left ventricle has mildly decreased function. There is the interventricular septum is flattened in systole and diastole, consistent with right ventricular pressure and volume overload.  2. Right ventricular systolic function is moderately reduced. The right ventricular size is severely enlarged.  3. No left atrial/left atrial appendage thrombus was detected.  4. Right atrial size was severely dilated.  5. The mitral valve is normal in structure. Trivial mitral valve  regurgitation.  6. The tricuspid valve is abnormal. Tricuspid valve regurgitation is severe. Mobile calcified mass on ventricular side of tricuspid valve, suspect healed vegetation versus torn chord from prior endocarditis.  7. The aortic valve is tricuspid. Aortic valve regurgitation is not visualized. No aortic stenosis is present.  8. Agitated saline contrast bubble study was positive with shunting observed within 3-6 cardiac cycles suggestive of interatrial shunt  Consults:  General surgery, hand surgery  DVT Prophylaxis : enoxaparin (LOVENOX) injection 40 mg Start: 10/28/20 1630 SCD's Start: 10/27/20 0030   Subjective:  Patient in bed, sleepy post I&D, no chest- abdominal pain.   Assessment/Plan:  Left breast/right/left thigh and left hand abscess, now developing right thigh abscess: S/p I&D by general surgery/hand surgery-wound cultures positive for staph aureus-remains on IV Vancomycin and Unasyn.  ID also following.  Procalcitonin and blood cultures so far negative , unfortunately on 10/31/2020 she has developed 2 new lesions on her right thigh.  Discussed with ID, TEE pending, general surgery post incision and drainage on 11/01/20.  History of recurrent tricuspid valve endocarditis (MSSA 2019, Enterococcus 2020, MRSA 2022): Concerned that she may have underlying endocarditis given her presentation-unfortunately blood cultures were done after patient received antibiotics-ID is following and directing antibiotics.  Repeat TTE stable, TEE noted with possible healed tricuspid vegetation and positive bubble study suggesting PFO with intra-atrial shunt.   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 difficile colitis - denies any diarrhea  History of syphilis: Stable for further treatment by ID MD outpatient setting  Recent vaginal  birth/delivery: She is not breast-feeding-claims that her 1 month child is  with family members.  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  Diet:  Diet Order             DIET SOFT Room service appropriate? Yes; Fluid consistency: Thin  Diet effective now                    Code Status:  Full code   Family Communication:  None at bedside  Disposition Plan:  Status is: Inpatient  Remains inpatient appropriate because:Inpatient level of care appropriate due to severity of illness  Dispo: The patient is from: Home              Anticipated d/c is to: Home              Patient currently is not medically stable to d/c.   Difficult to place patient No   Barriers to Discharge: Multiple abscesses-needs ongoing IV antibiotics-awaiting culture data.  Antimicrobial agents: Anti-infectives (From admission, onward)    Start     Dose/Rate Route Frequency Ordered Stop   10/30/20 1800  vancomycin (VANCOCIN) IVPB 750 mg/150 ml premix        750 mg 150 mL/hr over 60 Minutes Intravenous Every 12 hours 10/30/20 1018     10/30/20 0930  Ampicillin-Sulbactam (UNASYN) 3 g in sodium chloride 0.9 % 100 mL IVPB        3 g 200 mL/hr over 30 Minutes Intravenous Every 8 hours 10/30/20 0838     10/27/20 0530  Vancomycin (VANCOCIN) 1,250 mg in sodium chloride 0.9 % 250 mL IVPB  Status:  Discontinued        1,250 mg 166.7 mL/hr over 90 Minutes Intravenous Every 12 hours 10/26/20 1932 10/30/20 1018   10/26/20 1730  vancomycin (VANCOREADY) IVPB 1750 mg/350 mL        1,750 mg 175 mL/hr over 120 Minutes Intravenous  Once 10/26/20 1712 10/26/20 1946        Time spent: 25 minutes-Greater than 50% of this time was spent in counseling, explanation of diagnosis, planning of further management, and coordination of care.  MEDICATIONS: Scheduled Meds:  acidophilus  2 capsule Oral TID   enoxaparin (LOVENOX) injection  40 mg Subcutaneous Q24H   [START ON 11/05/2020]  furosemide  40 mg Oral Daily   methadone  130 mg Oral Daily   NIFEdipine  30 mg Oral Daily   prenatal vitamin w/FE, FA  1 tablet Oral Q1200   sertraline  50 mg Oral Daily   Continuous Infusions:  ampicillin-sulbactam (UNASYN) IV 3 g (11/02/20 0259)   vancomycin 750 mg (11/02/20 0600)   PRN Meds:.acetaminophen, HYDROmorphone (DILAUDID) injection, methocarbamol   PHYSICAL EXAM: Vital signs: Vitals:   11/01/20 2004 11/02/20 0039 11/02/20 0442 11/02/20 0723  BP: 109/75 91/64 106/76 99/75  Pulse: 63 67 61 (!) 57  Resp: 18 18 18 20   Temp: 98.2 F (36.8 C) 98.4 F (36.9 C) 98.6 F (37 C) 97.9 F (36.6 C)  TempSrc: Oral Oral Oral Oral  SpO2: 91% 94% 93% 92%  Weight:      Height:       Filed Weights   10/26/20 1649 11/01/20 0749  Weight: 85.8 kg 85.8 kg   Body mass index is 29.19 kg/m.   Gen Exam:  Awake Alert, No new F.N deficits, Normal affect [place].AT,PERRAL Supple  Neck,No JVD, No cervical lymphadenopathy appriciated.  Symmetrical Chest wall movement, Good air movement bilaterally, CTAB RRR,No Gallops, Rubs or new Murmurs, No Parasternal Heave +ve B.Sounds, Abd Soft, No tenderness, No organomegaly appriciated, No rebound - guarding or rigidity. L arm and hand under bandage, right thigh post I&D under dressing   I have personally reviewed following labs and imaging studies  LABORATORY DATA: CBC: Recent Labs  Lab 10/29/20 0200 10/30/20 0530 10/31/20 0658 11/01/20 0331 11/02/20 0130  WBC 8.6 8.4 9.5 9.0 10.6*  NEUTROABS  --  5.0 6.1 5.7 7.5  HGB 9.1* 9.3* 9.3* 10.2* 10.1*  HCT 30.0* 30.8* 30.7* 33.4* 33.5*  MCV 93.5 91.9 90.6 90.0 91.3  PLT 498* 545* 590* 613* 630*    Basic Metabolic Panel: Recent Labs  Lab 10/29/20 0200 10/30/20 0530 10/31/20 0658 11/01/20 0331 11/02/20 0130  NA 139 137 137 137 135  K 4.2 4.4 4.3 4.7 4.7  CL 102 99 95* 97* 93*  CO2 29 28 31 31 30   GLUCOSE 99 93 105* 102* 107*  BUN 16 15 18 20  21*  CREATININE 0.87 0.87 0.99 1.17*  1.08*  CALCIUM 8.7* 9.2 9.5 9.5 9.5  MG 2.1 2.0 2.0 2.2 2.2    GFR: Estimated Creatinine Clearance: 81.9 mL/min (A) (by C-G formula based on SCr of 1.08 mg/dL (H)).  Liver Function Tests: Recent Labs  Lab 10/28/20 0038 10/30/20 0530 10/31/20 0658 11/01/20 0331 11/02/20 0130  AST 11* 12* 11* 13* 11*  ALT 7 6 6 6 5   ALKPHOS 100 99 97 106 90  BILITOT 0.6 0.7 0.6 0.4 0.4  PROT 5.9* 6.5 6.6 7.4 7.2  ALBUMIN 2.3* 2.6* 2.5* 2.8* 2.9*   No results for input(s): LIPASE, AMYLASE in the last 168 hours. No results for input(s): AMMONIA in the last 168 hours.  Coagulation Profile: No results for input(s): INR, PROTIME in the last 168 hours.  Cardiac Enzymes: No results for input(s): CKTOTAL, CKMB, CKMBINDEX, TROPONINI in the last 168 hours.  BNP (last 3 results) No results for input(s): PROBNP in the last 8760 hours.  Lipid Profile: No results for input(s): CHOL, HDL, LDLCALC, TRIG, CHOLHDL, LDLDIRECT in the last 72 hours.  Thyroid Function Tests: No results for input(s): TSH, T4TOTAL, FREET4, T3FREE, THYROIDAB in the last 72 hours.  Anemia Panel: No results for input(s): VITAMINB12, FOLATE, FERRITIN, TIBC, IRON, RETICCTPCT in the last 72 hours.  Urine analysis:    Component Value Date/Time   COLORURINE AMBER (A) 07/13/2020 1320   APPEARANCEUR CLOUDY (A) 07/13/2020 1320   APPEARANCEUR Hazy 07/10/2013 1121   LABSPEC 1.028 07/13/2020 1320   LABSPEC 1.020 07/10/2013 1121   PHURINE 5.0 07/13/2020 1320   GLUCOSEU NEGATIVE 07/13/2020 1320   GLUCOSEU Negative 07/10/2013 1121   HGBUR SMALL (A) 07/13/2020 1320   BILIRUBINUR NEGATIVE 07/13/2020 1320   BILIRUBINUR Negative 07/10/2013 1121   KETONESUR 5 (A) 07/13/2020 1320   PROTEINUR 100 (A) 07/13/2020 1320   UROBILINOGEN 1.0 02/15/2015 0530   NITRITE POSITIVE (A) 07/13/2020 1320   LEUKOCYTESUR LARGE (A) 07/13/2020 1320   LEUKOCYTESUR Trace 07/10/2013 1121    Sepsis Labs: Lactic Acid, Venous    Component Value Date/Time    LATICACIDVEN 1.5 07/14/2020 0021    MICROBIOLOGY: Recent Results (from the past 240 hour(s))  Resp Panel by RT-PCR (Flu A&B, Covid) Nasopharyngeal Swab     Status: None   Collection Time: 10/26/20  7:05 PM   Specimen: Nasopharyngeal Swab; Nasopharyngeal(NP) swabs in vial transport medium  Result Value Ref  Range Status   SARS Coronavirus 2 by RT PCR NEGATIVE NEGATIVE Final    Comment: (NOTE) SARS-CoV-2 target nucleic acids are NOT DETECTED.  The SARS-CoV-2 RNA is generally detectable in upper respiratory specimens during the acute phase of infection. The lowest concentration of SARS-CoV-2 viral copies this assay can detect is 138 copies/mL. A negative result does not preclude SARS-Cov-2 infection and should not be used as the sole basis for treatment or other patient management decisions. A negative result may occur with  improper specimen collection/handling, submission of specimen other than nasopharyngeal swab, presence of viral mutation(s) within the areas targeted by this assay, and inadequate number of viral copies(<138 copies/mL). A negative result must be combined with clinical observations, patient history, and epidemiological information. The expected result is Negative.  Fact Sheet for Patients:  EntrepreneurPulse.com.au  Fact Sheet for Healthcare Providers:  IncredibleEmployment.be  This test is no t yet approved or cleared by the [place] FDA and  has been authorized for detection and/or diagnosis of SARS-CoV-2 by FDA under an Emergency Use Authorization (EUA). This EUA will remain  in effect (meaning this test can be used) for the duration of the COVID-19 declaration under Section 564(b)(1) of the Act, 21 U.S.C.section 360bbb-3(b)(1), unless the authorization is terminated  or revoked sooner.       Influenza A by PCR NEGATIVE NEGATIVE Final   Influenza B by PCR NEGATIVE NEGATIVE Final    Comment: (NOTE) The Xpert  Xpress SARS-CoV-2/FLU/RSV plus assay is intended as an aid in the diagnosis of influenza from Nasopharyngeal swab specimens and should not be used as a sole basis for treatment. Nasal washings and aspirates are unacceptable for Xpert Xpress SARS-CoV-2/FLU/RSV testing.  Fact Sheet for Patients: EntrepreneurPulse.com.au  Fact Sheet for Healthcare Providers: IncredibleEmployment.be  This test is not yet approved or cleared by the [place] FDA and has been authorized for detection and/or diagnosis of SARS-CoV-2 by FDA under an Emergency Use Authorization (EUA). This EUA will remain in effect (meaning this test can be used) for the duration of the COVID-19 declaration under Section 564(b)(1) of the Act, 21 U.S.C. section 360bbb-3(b)(1), unless the authorization is terminated or revoked.  Performed at [facility], [place] [address] [place], [place] [address]   Surgical pcr screen     Status: Abnormal   Collection Time: 10/26/20  7:06 PM   Specimen: Nasal Mucosa; Nasal Swab  Result Value Ref Range Status   MRSA, PCR POSITIVE (A) NEGATIVE Final    Comment: RESULT CALLED TO, READ BACK BY AND VERIFIED WITH: A. SOUTZ RN, AT [phone] 10/27/20 D. VANHOOK    Staphylococcus aureus POSITIVE (A) NEGATIVE Final    Comment: (NOTE) The Xpert SA Assay (FDA approved for NASAL specimens in patients 30 years of age and older), is one component of a comprehensive surveillance program. It is not intended to diagnose infection nor to guide or monitor treatment. Performed at [facility], [place] [address] [place], [place] [address]   Aerobic/Anaerobic Culture w Gram Stain (surgical/deep wound)     Status: None   Collection Time: 10/26/20  9:40 PM   Specimen: Abscess  Result Value Ref Range Status   Specimen Description ABSCESS  Final   Special Requests NONE  Final   Gram Stain   Final    MODERATE WBC PRESENT, PREDOMINANTLY PMN RARE GRAM POSITIVE COCCI  IN CLUSTERS    Culture   Final    RARE METHICILLIN RESISTANT STAPHYLOCOCCUS AUREUS RARE EIKENELLA CORRODENS Usually  susceptible to penicillin and other beta lactam agents,quinolones,macrolides and tetracyclines. NO ANAEROBES ISOLATED Performed at [facility], [place] [address], [place], [place] [address]    Report Status 11/01/2020 FINAL  Final   Organism ID, Bacteria METHICILLIN RESISTANT STAPHYLOCOCCUS AUREUS  Final      Susceptibility   Methicillin resistant staphylococcus aureus - MIC*    CIPROFLOXACIN >=8 RESISTANT Resistant     ERYTHROMYCIN >=8 RESISTANT Resistant     GENTAMICIN <=0.5 SENSITIVE Sensitive     OXACILLIN >=4 RESISTANT Resistant     TETRACYCLINE >=16 RESISTANT Resistant     VANCOMYCIN <=0.5 SENSITIVE Sensitive     TRIMETH/SULFA >=320 RESISTANT Resistant     CLINDAMYCIN <=0.25 SENSITIVE Sensitive     RIFAMPIN <=0.5 SENSITIVE Sensitive     Inducible Clindamycin NEGATIVE Sensitive     * RARE METHICILLIN RESISTANT STAPHYLOCOCCUS AUREUS  Aerobic/Anaerobic Culture w Gram Stain (surgical/deep wound)     Status: None   Collection Time: 10/26/20  9:44 PM   Specimen: PATH Other; Tissue  Result Value Ref Range Status   Specimen Description ABSCESS  Final   Special Requests LEFT  Final   Gram Stain   Final    ABUNDANT WBC PRESENT, PREDOMINANTLY PMN FEW GRAM POSITIVE COCCI IN CLUSTERS MODERATE GRAM POSITIVE RODS    Culture   Final    MODERATE METHICILLIN RESISTANT STAPHYLOCOCCUS AUREUS MODERATE EIKENELLA CORRODENS Usually susceptible to penicillin and other beta lactam agents,quinolones,macrolides and tetracyclines. NO ANAEROBES ISOLATED Performed at [facility], [place] [address] [place], [place] [address]    Report Status 11/01/2020 FINAL  Final   Organism ID, Bacteria METHICILLIN RESISTANT STAPHYLOCOCCUS AUREUS  Final      Susceptibility   Methicillin resistant staphylococcus aureus - MIC*    CIPROFLOXACIN >=8 RESISTANT Resistant     ERYTHROMYCIN >=8  RESISTANT Resistant     GENTAMICIN <=0.5 SENSITIVE Sensitive     OXACILLIN >=4 RESISTANT Resistant     TETRACYCLINE >=16 RESISTANT Resistant     VANCOMYCIN <=0.5 SENSITIVE Sensitive     TRIMETH/SULFA 160 RESISTANT Resistant     CLINDAMYCIN <=0.25 SENSITIVE Sensitive     RIFAMPIN <=0.5 SENSITIVE Sensitive     Inducible Clindamycin NEGATIVE Sensitive     * MODERATE METHICILLIN RESISTANT STAPHYLOCOCCUS AUREUS  Culture, blood (routine x 2)     Status: None   Collection Time: 10/27/20  1:46 PM   Specimen: BLOOD  Result Value Ref Range Status   Specimen Description BLOOD RIGHT ANTECUBITAL  Final   Special Requests   Final    BOTTLES DRAWN AEROBIC AND ANAEROBIC Blood Culture adequate volume   Culture   Final    NO GROWTH 5 DAYS Performed at [facility], [address], [place], [place] [address]    Report Status 11/01/2020 FINAL  Final  Culture, blood (routine x 2)     Status: None   Collection Time: 10/27/20  1:54 PM   Specimen: BLOOD RIGHT FOREARM  Result Value Ref Range Status   Specimen Description BLOOD RIGHT FOREARM  Final   Special Requests   Final    BOTTLES DRAWN AEROBIC ONLY Blood Culture adequate volume   Culture   Final    NO GROWTH 5 DAYS Performed at [facility], [place] [address] [place], [place] [address]    Report Status 11/01/2020 FINAL  Final    RADIOLOGY STUDIES/RESULTS: ECHO TEE  Result Date: 11/01/2020    TRANSESOPHOGEAL ECHO REPORT   Patient Name:  Kelly [name] Date of Exam: 11/01/2020 Medical Rec #:  [id number]          Height:       67.5 in Accession #:    [id number]         Weight:       189.2 lb Date of Birth:  03/26/1986           BSA:          1.986 m Patient Age:    75 years           BP:           132/90 mmHg Patient Gender: F                  HR:           61 bpm. Exam Location:  Inpatient Procedure: Transesophageal Echo, Color Doppler and 3D Echo Indications:     Bacteremia R78.81  History:         Patient has prior history of  Echocardiogram examinations, most                  recent 10/30/2020. Risk Factors:IV Drug User.  Sonographer:     Dustin Flock Referring Phys:  [place] Diagnosing Phys: Oswaldo Milian MD PROCEDURE: After discussion of the risks and benefits of a TEE, an informed consent was obtained. The transesophogeal probe was passed without difficulty through the esophogus of the patient. Sedation performed by different physician. The patient was monitored while under deep sedation. Anesthestetic sedation was provided intravenously by Anesthesiology: 441.34m of Propofol. The patient developed no complications during the procedure. IMPRESSIONS  1. Left ventricular ejection fraction, by estimation, is 40 to 45%. The left ventricle has mildly decreased function. There is the interventricular septum is flattened in systole and diastole, consistent with right ventricular pressure and volume overload.  2. Right ventricular systolic function is moderately reduced. The right ventricular size is severely enlarged.  3. No left atrial/left atrial appendage thrombus was detected.  4. Right atrial size was severely dilated.  5. The mitral valve is normal in structure. Trivial mitral valve regurgitation.  6. The tricuspid valve is abnormal. Tricuspid valve regurgitation is severe. Mobile calcified mass on ventricular side of tricuspid valve, suspect healed vegetation versus torn chord from prior endocarditis.  7. The aortic valve is tricuspid. Aortic valve regurgitation is not visualized. No aortic stenosis is present.  8. Agitated saline contrast bubble study was positive with shunting observed within 3-6 cardiac cycles suggestive of interatrial shunt. FINDINGS  Left Ventricle: Left ventricular ejection fraction, by estimation, is 40 to 45%. The left ventricle has mildly decreased function. The left ventricular internal cavity size was normal in size. The interventricular septum is flattened in systole and  diastole, consistent with right ventricular pressure and volume overload. Right Ventricle: The right ventricular size is severely enlarged. Right vetricular wall thickness was not well visualized. Right ventricular systolic function is moderately reduced. Left Atrium: Left atrial size was normal in size. No left atrial/left atrial appendage thrombus was detected. Right Atrium: Right atrial size was severely dilated. Pericardium: There is no evidence of pericardial effusion. Mitral Valve: The mitral valve is normal in structure. Trivial mitral valve regurgitation. Tricuspid Valve: The tricuspid valve is abnormal. Tricuspid valve regurgitation is severe. Aortic Valve: The aortic valve is tricuspid. Aortic valve regurgitation is not visualized. No aortic stenosis is present. Pulmonic Valve: The pulmonic valve was grossly normal. Pulmonic valve regurgitation is trivial.  Aorta: The aortic root and ascending aorta are structurally normal, with no evidence of dilitation. IAS/Shunts: No atrial level shunt detected by color flow Doppler. Agitated saline contrast was given intravenously to evaluate for intracardiac shunting. Agitated saline contrast bubble study was positive with shunting observed within 3-6 cardiac cycles suggestive of interatrial shunt.  TRICUSPID VALVE TR [place]:   23.8 mmHg TR Vmax:        244.00 cm/s Oswaldo Milian MD Electronically signed by Oswaldo Milian MD Signature Date/Time: 11/01/2020/3:27:53 PM    Final      LOS: 7 days   Signature  Lala Lund M.D on 11/02/2020 at 9:25 AM   -  To page go to [email or web address]

## 2020-11-02 NOTE — Progress Notes (Signed)
1 Day Post-Op   Subjective/Chief Complaint: Pt with some soreness Dressing changed yesteday   Objective: Vital signs in last 24 hours: Temp:  [97.9 F (36.6 C)-98.6 F (37 C)] 97.9 F (36.6 C) (06/22 0723) Pulse Rate:  [52-84] 57 (06/22 0723) Resp:  [8-20] 20 (06/22 0723) BP: (91-132)/(64-99) 99/75 (06/22 0723) SpO2:  [88 %-100 %] 92 % (06/22 0723) Last BM Date: 10/30/20  Intake/Output from previous day: 06/21 0701 - 06/22 0700 In: 1233.7 [I.V.:733.7; IV Piggyback:500.1] Out: -  Intake/Output this shift: No intake/output data recorded.  General appearance: alert and cooperative Wounds c/d/I shallow, clean base, no erythema  Lab Results:  Recent Labs    11/01/20 0331 11/02/20 0130  WBC 9.0 10.6*  HGB 10.2* 10.1*  HCT 33.4* 33.5*  PLT 613* 630*   BMET Recent Labs    11/01/20 0331 11/02/20 0130  NA 137 135  K 4.7 4.7  CL 97* 93*  CO2 31 30  GLUCOSE 102* 107*  BUN 20 21*  CREATININE 1.17* 1.08*  CALCIUM 9.5 9.5   PT/INR No results for input(s): LABPROT, INR in the last 72 hours. ABG No results for input(s): PHART, HCO3 in the last 72 hours.  Invalid input(s): PCO2, PO2  Studies/Results: ECHO TEE  Result Date: 11/01/2020    TRANSESOPHOGEAL ECHO REPORT   Patient Name:   Kelly [name] Date of Exam: 11/01/2020 Medical Rec #:  [id number]          Height:       67.5 in Accession #:    [id number]         Weight:       189.2 lb Date of Birth:  June 26, 1985           BSA:          1.986 m Patient Age:    35 years           BP:           132/90 mmHg Patient Gender: F                  HR:           61 bpm. Exam Location:  Inpatient Procedure: Transesophageal Echo, Color Doppler and 3D Echo Indications:     Bacteremia R78.81  History:         Patient has prior history of Echocardiogram examinations, most                  recent 10/30/2020. Risk Factors:IV Drug User.  Sonographer:     Dustin Flock Referring Phys:  [place] Diagnosing Phys: Oswaldo Milian MD PROCEDURE: After discussion of the risks and benefits of a TEE, an informed consent was obtained. The transesophogeal probe was passed without difficulty through the esophogus of the patient. Sedation performed by different physician. The patient was monitored while under deep sedation. Anesthestetic sedation was provided intravenously by Anesthesiology: 441.40m of Propofol. The patient developed no complications during the procedure. IMPRESSIONS  1. Left ventricular ejection fraction, by estimation, is 40 to 45%. The left ventricle has mildly decreased function. There is the interventricular septum is flattened in systole and diastole, consistent with right ventricular pressure and volume overload.  2. Right ventricular systolic function is moderately reduced. The right ventricular size is severely enlarged.  3. No left atrial/left atrial appendage thrombus was detected.  4. Right atrial size was severely dilated.  5. The mitral valve is normal in structure. Trivial mitral valve  regurgitation.  6. The tricuspid valve is abnormal. Tricuspid valve regurgitation is severe. Mobile calcified mass on ventricular side of tricuspid valve, suspect healed vegetation versus torn chord from prior endocarditis.  7. The aortic valve is tricuspid. Aortic valve regurgitation is not visualized. No aortic stenosis is present.  8. Agitated saline contrast bubble study was positive with shunting observed within 3-6 cardiac cycles suggestive of interatrial shunt. FINDINGS  Left Ventricle: Left ventricular ejection fraction, by estimation, is 40 to 45%. The left ventricle has mildly decreased function. The left ventricular internal cavity size was normal in size. The interventricular septum is flattened in systole and diastole, consistent with right ventricular pressure and volume overload. Right Ventricle: The right ventricular size is severely enlarged. Right vetricular wall thickness was not well visualized. Right  ventricular systolic function is moderately reduced. Left Atrium: Left atrial size was normal in size. No left atrial/left atrial appendage thrombus was detected. Right Atrium: Right atrial size was severely dilated. Pericardium: There is no evidence of pericardial effusion. Mitral Valve: The mitral valve is normal in structure. Trivial mitral valve regurgitation. Tricuspid Valve: The tricuspid valve is abnormal. Tricuspid valve regurgitation is severe. Aortic Valve: The aortic valve is tricuspid. Aortic valve regurgitation is not visualized. No aortic stenosis is present. Pulmonic Valve: The pulmonic valve was grossly normal. Pulmonic valve regurgitation is trivial. Aorta: The aortic root and ascending aorta are structurally normal, with no evidence of dilitation. IAS/Shunts: No atrial level shunt detected by color flow Doppler. Agitated saline contrast was given intravenously to evaluate for intracardiac shunting. Agitated saline contrast bubble study was positive with shunting observed within 3-6 cardiac cycles suggestive of interatrial shunt.  TRICUSPID VALVE TR Peak grad:   23.8 mmHg TR Vmax:        244.00 cm/s Oswaldo Milian MD Electronically signed by Oswaldo Milian MD Signature Date/Time: 11/01/2020/3:27:53 PM    Final     Anti-infectives: Anti-infectives (From admission, onward)    Start     Dose/Rate Route Frequency Ordered Stop   10/30/20 1800  vancomycin (VANCOCIN) IVPB 750 mg/150 ml premix        750 mg 150 mL/hr over 60 Minutes Intravenous Every 12 hours 10/30/20 1018     10/30/20 0930  Ampicillin-Sulbactam (UNASYN) 3 g in sodium chloride 0.9 % 100 mL IVPB        3 g 200 mL/hr over 30 Minutes Intravenous Every 8 hours 10/30/20 0838     10/27/20 0530  Vancomycin (VANCOCIN) 1,250 mg in sodium chloride 0.9 % 250 mL IVPB  Status:  Discontinued        1,250 mg 166.7 mL/hr over 90 Minutes Intravenous Every 12 hours 10/26/20 1932 10/30/20 1018   10/26/20 1730  vancomycin (VANCOREADY)  IVPB 1750 mg/350 mL        1,750 mg 175 mL/hr over 120 Minutes Intravenous  Once 10/26/20 1712 10/26/20 1946       Assessment/Plan: POD 7 S/P I&D bilateral upper thigh abscesses, L breast abscess, CW 6/16 for Multiple soft tissue abscesses POD 1 Right thigh abscesses I&D - BID dressing changes. Wet to dry on left breast with minimal superficial packing. Dry gauze on b/l thighs - multimodal pain control in addition to methadone. - continue IV Abx per ID. Follow cultures - currently S. Aureus - Wounds stable.      FEN: diet as tolerated following surgery ID: vancomycin 6/15 >>  Unasyn 6/19  -> VTE: SCDs, Lovenox   L hand abscess - s/p I&D by Dr.  Ortman - per Hand Surgery   Per medicine IVDA Endocarditis Heart failure HTN HCV syphilis   LOS: 7 days    Ralene Ok 11/02/2020

## 2020-11-02 NOTE — Anesthesia Postprocedure Evaluation (Signed)
Anesthesia Post Note  Patient: Kelly [name]  Procedure(s) Performed: TRANSESOPHAGEAL ECHOCARDIOGRAM (TEE) BUBBLE STUDY     Patient location during evaluation: PACU Anesthesia Type: MAC Level of consciousness: awake and alert Pain management: pain level controlled Vital Signs Assessment: post-procedure vital signs reviewed and stable Respiratory status: spontaneous breathing, nonlabored ventilation, respiratory function stable and patient connected to nasal cannula oxygen Cardiovascular status: stable and blood pressure returned to baseline Postop Assessment: no apparent nausea or vomiting Anesthetic complications: no   No notable events documented.  Last Vitals:  Vitals:   11/02/20 1621 11/02/20 1948  BP: 109/75 105/75  Pulse: (!) 57 (!) 56  Resp: 20 16  Temp: 36.7 C 36.9 C  SpO2: 98% 92%    Last Pain:  Vitals:   11/02/20 1631  TempSrc:   PainSc: Asleep                 Nolan Tuazon

## 2020-11-03 ENCOUNTER — Encounter (HOSPITAL_COMMUNITY): Payer: Self-pay | Admitting: Cardiology

## 2020-11-03 ENCOUNTER — Telehealth (HOSPITAL_COMMUNITY): Payer: Self-pay | Admitting: Licensed Clinical Social Worker

## 2020-11-03 LAB — COMPREHENSIVE METABOLIC PANEL
ALT: 5 U/L (ref 0–44)
AST: 11 U/L — ABNORMAL LOW (ref 15–41)
Albumin: 2.8 g/dL — ABNORMAL LOW (ref 3.5–5.0)
Alkaline Phosphatase: 89 U/L (ref 38–126)
Anion gap: 8 (ref 5–15)
BUN: 22 mg/dL — ABNORMAL HIGH (ref 6–20)
CO2: 30 mmol/L (ref 22–32)
Calcium: 9.3 mg/dL (ref 8.9–10.3)
Chloride: 100 mmol/L (ref 98–111)
Creatinine, Ser: 1.08 mg/dL — ABNORMAL HIGH (ref 0.44–1.00)
GFR, Estimated: >60 mL/min (ref >60)
Glucose, Bld: 99 mg/dL (ref 70–99)
Potassium: 4.5 mmol/L (ref 3.5–5.1)
Sodium: 138 mmol/L (ref 135–145)
Total Bilirubin: 0.4 mg/dL (ref 0.3–1.2)
Total Protein: 7 g/dL (ref 6.5–8.1)

## 2020-11-03 LAB — CBC WITH DIFFERENTIAL/PLATELET
Abs Immature Granulocytes: 0.06 10*3/uL (ref 0.00–0.07)
Basophils Absolute: 0.1 10*3/uL (ref 0.0–0.1)
Basophils Relative: 1 %
Eosinophils Absolute: 0.1 10*3/uL (ref 0.0–0.5)
Eosinophils Relative: 1 %
HCT: 32.8 % — ABNORMAL LOW (ref 36.0–46.0)
Hemoglobin: 9.9 g/dL — ABNORMAL LOW (ref 12.0–15.0)
Immature Granulocytes: 1 %
Lymphocytes Relative: 39 %
Lymphs Abs: 3.2 10*3/uL (ref 0.7–4.0)
MCH: 27.9 pg (ref 26.0–34.0)
MCHC: 30.2 g/dL (ref 30.0–36.0)
MCV: 92.4 fL (ref 80.0–100.0)
Monocytes Absolute: 0.5 10*3/uL (ref 0.1–1.0)
Monocytes Relative: 6 %
Neutro Abs: 4.3 10*3/uL (ref 1.7–7.7)
Neutrophils Relative %: 52 %
Platelets: 596 10*3/uL — ABNORMAL HIGH (ref 150–400)
RBC: 3.55 MIL/uL — ABNORMAL LOW (ref 3.87–5.11)
RDW: 15.9 % — ABNORMAL HIGH (ref 11.5–15.5)
WBC: 8.2 10*3/uL (ref 4.0–10.5)
nRBC: 0 % (ref 0.0–0.2)

## 2020-11-03 LAB — C-REACTIVE PROTEIN: CRP: 1.2 mg/dL — ABNORMAL HIGH (ref <1.0)

## 2020-11-03 LAB — PROCALCITONIN: Procalcitonin: <0.1 ng/mL

## 2020-11-03 LAB — MAGNESIUM: Magnesium: 2.1 mg/dL (ref 1.7–2.4)

## 2020-11-03 NOTE — Telephone Encounter (Signed)
CSW received a call from patient who is currently admitted to [facility] requesting assistance with her cell phone. Patient reports that her phone is broken and she has no way to access the repair process as she has no active phone or Internet service. CSW attempted to access Internet site for repair through her insurance plan with [employer] although unable to access as patient unaware of zip code associated with her account. Patient states she needs a phone and Internet to make calls for available treatment programs post discharge. CSW unable to assist with phone repair but provided patient with a tracfone with one month activation to make needed calls.  Patient states that she was in a treatment program prior to admission with [facility] but reports there is "no active program to help with her sobriety". She states that she lived with a family with 6 children in a single family home and participated in daily bible readings with no supplemental treatment program. Patient states she was told by the owners when she left for the hospital that she could not return because "they don't take people on medicine so they had to d/c me". CSW provided activated cell phone and list of treatment programs in the area. Patient states she is working with the inpatient CSW to explore options for treatment programs post discharge. CSW will follow up with patient in the [facility] clinic post discharge. Raquel Sarna, [place], [place]

## 2020-11-03 NOTE — TOC Progression Note (Addendum)
Transition of Care [facility]) - Progression Note    Patient Details  Name: Kelly [name] MRN: [id number] Date of Birth: 1985/07/30  [facility] CM/SW [place], LCSW Phone Number: 11/03/2020, 1:57 PM  Clinical Narrative:    -CSW contacted [facility] and their program is 14 days long and they require a referral from patient's methadone clinic along with a dosing history and a letter stating they will be able to accept the patient back after the program is completed.   -CSW contacted [facility], [facility], [facility], and [facility]. None of them can accept methadone. CSW left voicemail for [facility]. CSW spoke with [facility] treatment center; they are unsure if they can accept methadone but are willing to review referral, which CSW will fax. CSW contacted [facility] to ask about patient's status on their waitlist (SW from previous admission added her to their list). They will contact CSW back. CSW contacted [facility] and did not receive an answer.   -CSW spoke with [facility] and [facility] but they do not accept Medicaid. CSW contacted [facility] treatment center in [place] but they do not accept charity or [place] Medicaid.   -CSW spoke with [facility] in [place]; they have a 7-14 day treatment program and are able to review referral. CSW will fax to [phone].   -Patient stated she is also agreeable to an [facility] referral as well. CSW spoke with Theodoro Clock at [facility]. She stated that patient would need to be able to transport to her methadone clinic and there is not one near her building so she recommended [facility], which is near [place] treatment center. CSW left a voicemail with Caryl Pina there [phone]).   Expected Discharge Plan: Home/Self Care Barriers to Discharge: Continued Medical Work up  Expected Discharge Plan and Services Expected Discharge Plan: Home/Self Care In-house Referral: Clinical Social  Work Discharge Planning Services: CM Consult   Living arrangements for the past 2 months: [place] Determinants of Health (SDOH) Interventions    Readmission Risk Interventions Readmission Risk Prevention Plan 08/25/2020 07/30/2018  Transportation Screening Complete Complete  Transportation Screening Comment Rooms at the [facility] to provide transportation for the patient. -  PCP or Specialist Appt within 5-7 Days Complete -  Not Complete comments 08/29/20 OB/GYN appointment scheduled -  Home Care Screening Complete -  Medication Review (RN Care Manager) - Complete  PCP or Specialist appointment within 3-5 days of discharge - Complete  HRI or [place] - Not Complete  HRI or Home Care Consult Pt Refusal Comments - N/A  SW Recovery Care/Counseling Consult - Complete  Palliative Care Screening - Not Complete  Comments - N/A  Skilled Nursing Facility - Not Complete  SNF Comments - N/A  Some recent data might be hidden

## 2020-11-03 NOTE — Progress Notes (Signed)
PROGRESS NOTE        PATIENT DETAILS Name: Kelly [name] Age: 35 y.o. Sex: female Date of Birth: [birth year] Admit Date: 10/26/2020 Admitting Physician Lequita Halt, MD PCP:[facility], [facility]  Brief Narrative: Patient is a 35 y.o. 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  She has a history of IV heroin/cocaine use-but claims she has been clean for several months.  UDS negative.  Significant events: 6/15>> admit for left breast/right thigh/left hand abscess.  Significant studies: 5/12>> TTE: EF 90-24%, RV systolic function moderately reduced.  Severe TR. 6/15>> CXR: No pneumonia 6/15>> x-ray left hand: No fracture/dislocation-dorsal soft tissue swelling  Antimicrobial therapy: Vancomycin: 6/15>>  Microbiology data: 6/15>> COVID/influenza PCR: Negative 6/15>> abscess cultures: Staff aureus (sensitivities pending) 6/16>> blood cultures: No growth  Procedures :  6/15>> I&D of left breast/right thigh/left thigh/left hand abscess by general surgery and orthopedics.  6/21 - by [facility] Incision and drainage of right thigh abscess, 2 x 2 cm involving subcutaneous fat Incision and drainage of right lateral thigh abscess, 2 x 2 cm, also involving subcutaneous fat  6/21 - TEE -  1. Left ventricular ejection fraction, by estimation, is 40 to 45%. The left ventricle has mildly decreased function. There is the interventricular septum is flattened in systole and diastole, consistent with right ventricular pressure and volume overload.  2. Right ventricular systolic function is moderately reduced. The right ventricular size is severely enlarged.  3. No left atrial/left atrial appendage thrombus was detected.  4. Right atrial size was severely dilated.  5. The mitral valve is normal in structure. Trivial mitral valve  regurgitation.  6. The tricuspid valve is abnormal. Tricuspid valve regurgitation is severe. Mobile calcified mass on ventricular side of tricuspid valve, suspect healed vegetation versus torn chord from prior endocarditis.  7. The aortic valve is tricuspid. Aortic valve regurgitation is not visualized. No aortic stenosis is present.  8. Agitated saline contrast bubble study was positive with shunting observed within 3-6 cardiac cycles suggestive of interatrial shunt  Consults:  General surgery, hand surgery  DVT Prophylaxis : enoxaparin (LOVENOX) injection 40 mg Start: 10/28/20 1630 SCD's Start: 10/27/20 0030   Subjective:  Patient in bed, appears comfortable, denies any headache, no fever, no chest pain or pressure, no shortness of breath , no abdominal pain. No new focal weakness.   Assessment/Plan:  Left breast/right/left thigh and left hand abscess, now developing right thigh abscess: S/p I&D by general surgery/hand surgery-wound cultures positive for staph aureus-remains on IV Vancomycin and Unasyn.  ID also following.  Procalcitonin and blood cultures so far negative , unfortunately on 10/31/2020 she has developed 2 new lesions on her right thigh.  Discussed with ID, TEE pending, general surgery post incision and drainage on 11/01/20.  History of recurrent tricuspid valve endocarditis (MSSA 2019, Enterococcus 2020, MRSA 2022): Concerned that she may have underlying endocarditis given her presentation-unfortunately blood cultures were done after patient received antibiotics-ID is following and directing antibiotics.  Repeat TTE stable, TEE noted with possible healed tricuspid vegetation and positive bubble study suggesting PFO with intra-atrial shunt.   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 difficile colitis - denies any diarrhea  History of  syphilis: Stable for further treatment by ID MD outpatient setting  Recent vaginal birth/delivery: She is not breast-feeding-claims that her 1 month child is  with family members.  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  Diet:  Diet Order             DIET SOFT Room service appropriate? Yes; Fluid consistency: Thin  Diet effective now                    Code Status:  Full code   Family Communication:  None at bedside  Disposition Plan:  Status is: Inpatient  Remains inpatient appropriate because:Inpatient level of care appropriate due to severity of illness  Dispo: The patient is from: Home              Anticipated d/c is to: Home              Patient currently is not medically stable to d/c.   Difficult to place patient No   Barriers to Discharge: Multiple abscesses-needs ongoing IV antibiotics-awaiting culture data.  Antimicrobial agents: Anti-infectives (From admission, onward)    Start     Dose/Rate Route Frequency Ordered Stop   10/30/20 1800  vancomycin (VANCOCIN) IVPB 750 mg/150 ml premix        750 mg 150 mL/hr over 60 Minutes Intravenous Every 12 hours 10/30/20 1018     10/30/20 0930  Ampicillin-Sulbactam (UNASYN) 3 g in sodium chloride 0.9 % 100 mL IVPB        3 g 200 mL/hr over 30 Minutes Intravenous Every 8 hours 10/30/20 0838     10/27/20 0530  Vancomycin (VANCOCIN) 1,250 mg in sodium chloride 0.9 % 250 mL IVPB  Status:  Discontinued        1,250 mg 166.7 mL/hr over 90 Minutes Intravenous Every 12 hours 10/26/20 1932 10/30/20 1018   10/26/20 1730  vancomycin (VANCOREADY) IVPB 1750 mg/350 mL        1,750 mg 175 mL/hr over 120 Minutes Intravenous  Once 10/26/20 1712 10/26/20 1946        Time spent: 25 minutes-Greater than 50% of this time was spent in counseling, explanation of diagnosis, planning of further management, and coordination of care.  MEDICATIONS: Scheduled Meds:  acidophilus  2 capsule Oral TID    enoxaparin (LOVENOX) injection  40 mg Subcutaneous Q24H   [START ON 11/05/2020] furosemide  40 mg Oral Daily   methadone  130 mg Oral Daily   NIFEdipine  30 mg Oral Daily   prenatal vitamin w/FE, FA  1 tablet Oral Q1200   sertraline  50 mg Oral Daily   Continuous Infusions:  sodium chloride 10 mL/hr at 11/03/20 0700   ampicillin-sulbactam (UNASYN) IV 3 g (11/03/20 3818)   vancomycin Stopped (11/03/20 0611)   PRN Meds:.sodium chloride, acetaminophen, HYDROmorphone (DILAUDID) injection, methocarbamol   PHYSICAL EXAM: Vital signs: Vitals:   11/02/20 2348 11/02/20 2351 11/03/20 0309 11/03/20 0803  BP:  109/77 119/74 (!) 129/91  Pulse:  (!) 58 (!) 57 (!) 55  Resp:  16 20 16   Temp: 98.6 F (37 C) 98.6 F (37 C) 98 F (36.7 C) 98 F (36.7 C)  TempSrc: Oral   Oral  SpO2:  96% 95% 92%  Weight:      Height:       Filed Weights   10/26/20 1649 11/01/20 0749  Weight: 85.8 kg 85.8 kg   Body mass index is 29.19 kg/m.   Gen Exam:  Awake Alert, No new F.N deficits, Normal affect [place].AT,PERRAL Supple Neck,No JVD, No cervical lymphadenopathy appriciated.  Symmetrical Chest wall movement, Good air movement bilaterally, CTAB RRR,No Gallops, Rubs or new Murmurs, No Parasternal Heave +ve B.Sounds, Abd Soft, No tenderness, No organomegaly appriciated, No rebound - guarding or rigidity. L arm and hand under bandage, right thigh post I&D under dressing   I have personally reviewed following labs and imaging studies  LABORATORY DATA: CBC: Recent Labs  Lab 10/30/20 0530 10/31/20 0658 11/01/20 0331 11/02/20 0130 11/03/20 0106  WBC 8.4 9.5 9.0 10.6* 8.2  NEUTROABS 5.0 6.1 5.7 7.5 4.3  HGB 9.3* 9.3* 10.2* 10.1* 9.9*  HCT 30.8* 30.7* 33.4* 33.5* 32.8*  MCV 91.9 90.6 90.0 91.3 92.4  PLT 545* 590* 613* 630* 596*    Basic Metabolic Panel: Recent Labs  Lab 10/30/20 0530 10/31/20 0658 11/01/20 0331 11/02/20 0130 11/03/20 0106  NA 137 137 137 135 138  K 4.4 4.3 4.7 4.7 4.5  CL  99 95* 97* 93* 100  CO2 28 31 31 30 30   GLUCOSE 93 105* 102* 107* 99  BUN 15 18 20  21* 22*  CREATININE 0.87 0.99 1.17* 1.08* 1.08*  CALCIUM 9.2 9.5 9.5 9.5 9.3  MG 2.0 2.0 2.2 2.2 2.1    GFR: Estimated Creatinine Clearance: 81.9 mL/min (A) (by C-G formula based on SCr of 1.08 mg/dL (H)).  Liver Function Tests: Recent Labs  Lab 10/30/20 0530 10/31/20 0658 11/01/20 0331 11/02/20 0130 11/03/20 0106  AST 12* 11* 13* 11* 11*  ALT 6 6 6 5 5   ALKPHOS 99 97 106 90 89  BILITOT 0.7 0.6 0.4 0.4 0.4  PROT 6.5 6.6 7.4 7.2 7.0  ALBUMIN 2.6* 2.5* 2.8* 2.9* 2.8*   No results for input(s): LIPASE, AMYLASE in the last 168 hours. No results for input(s): AMMONIA in the last 168 hours.  Coagulation Profile: No results for input(s): INR, PROTIME in the last 168 hours.  Cardiac Enzymes: No results for input(s): CKTOTAL, CKMB, CKMBINDEX, TROPONINI in the last 168 hours.  BNP (last 3 results) No results for input(s): PROBNP in the last 8760 hours.  Lipid Profile: No results for input(s): CHOL, HDL, LDLCALC, TRIG, CHOLHDL, LDLDIRECT in the last 72 hours.  Thyroid Function Tests: No results for input(s): TSH, T4TOTAL, FREET4, T3FREE, THYROIDAB in the last 72 hours.  Anemia Panel: No results for input(s): VITAMINB12, FOLATE, FERRITIN, TIBC, IRON, RETICCTPCT in the last 72 hours.  Urine analysis:    Component Value Date/Time   COLORURINE AMBER (A) 07/13/2020 1320   APPEARANCEUR CLOUDY (A) 07/13/2020 1320   APPEARANCEUR Hazy 07/10/2013 1121   LABSPEC 1.028 07/13/2020 1320   LABSPEC 1.020 07/10/2013 1121   PHURINE 5.0 07/13/2020 1320   GLUCOSEU NEGATIVE 07/13/2020 1320   GLUCOSEU Negative 07/10/2013 1121   HGBUR SMALL (A) 07/13/2020 1320   BILIRUBINUR NEGATIVE 07/13/2020 1320   BILIRUBINUR Negative 07/10/2013 1121   KETONESUR 5 (A) 07/13/2020 1320   PROTEINUR 100 (A) 07/13/2020 1320   UROBILINOGEN 1.0 02/15/2015 0530   NITRITE POSITIVE (A) 07/13/2020 1320   LEUKOCYTESUR LARGE (A)  07/13/2020 1320   LEUKOCYTESUR Trace 07/10/2013 1121    Sepsis Labs: Lactic Acid, Venous    Component Value Date/Time   LATICACIDVEN 1.5 07/14/2020 0021    MICROBIOLOGY: Recent Results (from the past 240 hour(s))  Resp Panel by RT-PCR (Flu A&B, Covid) Nasopharyngeal Swab  Status: None   Collection Time: 10/26/20  7:05 PM   Specimen: Nasopharyngeal Swab; Nasopharyngeal(NP) swabs in vial transport medium  Result Value Ref Range Status   SARS Coronavirus 2 by RT PCR NEGATIVE NEGATIVE Final    Comment: (NOTE) SARS-CoV-2 target nucleic acids are NOT DETECTED.  The SARS-CoV-2 RNA is generally detectable in upper respiratory specimens during the acute phase of infection. The lowest concentration of SARS-CoV-2 viral copies this assay can detect is 138 copies/mL. A negative result does not preclude SARS-Cov-2 infection and should not be used as the sole basis for treatment or other patient management decisions. A negative result may occur with  improper specimen collection/handling, submission of specimen other than nasopharyngeal swab, presence of viral mutation(s) within the areas targeted by this assay, and inadequate number of viral copies(<138 copies/mL). A negative result must be combined with clinical observations, patient history, and epidemiological information. The expected result is Negative.  Fact Sheet for Patients:  EntrepreneurPulse.com.au  Fact Sheet for Healthcare Providers:  IncredibleEmployment.be  This test is no t yet approved or cleared by the [place] FDA and  has been authorized for detection and/or diagnosis of SARS-CoV-2 by FDA under an Emergency Use Authorization (EUA). This EUA will remain  in effect (meaning this test can be used) for the duration of the COVID-19 declaration under Section 564(b)(1) of the Act, 21 U.S.C.section 360bbb-3(b)(1), unless the authorization is terminated  or revoked sooner.        Influenza A by PCR NEGATIVE NEGATIVE Final   Influenza B by PCR NEGATIVE NEGATIVE Final    Comment: (NOTE) The Xpert Xpress SARS-CoV-2/FLU/RSV plus assay is intended as an aid in the diagnosis of influenza from Nasopharyngeal swab specimens and should not be used as a sole basis for treatment. Nasal washings and aspirates are unacceptable for Xpert Xpress SARS-CoV-2/FLU/RSV testing.  Fact Sheet for Patients: EntrepreneurPulse.com.au  Fact Sheet for Healthcare Providers: IncredibleEmployment.be  This test is not yet approved or cleared by the [place] FDA and has been authorized for detection and/or diagnosis of SARS-CoV-2 by FDA under an Emergency Use Authorization (EUA). This EUA will remain in effect (meaning this test can be used) for the duration of the COVID-19 declaration under Section 564(b)(1) of the Act, 21 U.S.C. section 360bbb-3(b)(1), unless the authorization is terminated or revoked.  Performed at [facility], [place] [address], [place], [place] [address]   Surgical pcr screen     Status: Abnormal   Collection Time: 10/26/20  7:06 PM   Specimen: Nasal Mucosa; Nasal Swab  Result Value Ref Range Status   MRSA, PCR POSITIVE (A) NEGATIVE Final    Comment: RESULT CALLED TO, READ BACK BY AND VERIFIED WITH: A. SOUTZ RN, AT [phone] 10/27/20 D. VANHOOK    Staphylococcus aureus POSITIVE (A) NEGATIVE Final    Comment: (NOTE) The Xpert SA Assay (FDA approved for NASAL specimens in patients 62 years of age and older), is one component of a comprehensive surveillance program. It is not intended to diagnose infection nor to guide or monitor treatment. Performed at [facility], [place] [address] [place], [place] [address]   Aerobic/Anaerobic Culture w Gram Stain (surgical/deep wound)     Status: None   Collection Time: 10/26/20  9:40 PM   Specimen: Abscess  Result Value Ref Range Status   Specimen Description ABSCESS  Final    Special Requests NONE  Final   Gram Stain   Final    MODERATE WBC PRESENT, PREDOMINANTLY PMN  RARE GRAM POSITIVE COCCI IN CLUSTERS    Culture   Final    RARE METHICILLIN RESISTANT STAPHYLOCOCCUS AUREUS RARE EIKENELLA CORRODENS Usually susceptible to penicillin and other beta lactam agents,quinolones,macrolides and tetracyclines. NO ANAEROBES ISOLATED Performed at [facility], [place] [address], [place], [place] [address]    Report Status 11/01/2020 FINAL  Final   Organism ID, Bacteria METHICILLIN RESISTANT STAPHYLOCOCCUS AUREUS  Final      Susceptibility   Methicillin resistant staphylococcus aureus - MIC*    CIPROFLOXACIN >=8 RESISTANT Resistant     ERYTHROMYCIN >=8 RESISTANT Resistant     GENTAMICIN <=0.5 SENSITIVE Sensitive     OXACILLIN >=4 RESISTANT Resistant     TETRACYCLINE >=16 RESISTANT Resistant     VANCOMYCIN <=0.5 SENSITIVE Sensitive     TRIMETH/SULFA >=320 RESISTANT Resistant     CLINDAMYCIN <=0.25 SENSITIVE Sensitive     RIFAMPIN <=0.5 SENSITIVE Sensitive     Inducible Clindamycin NEGATIVE Sensitive     * RARE METHICILLIN RESISTANT STAPHYLOCOCCUS AUREUS  Aerobic/Anaerobic Culture w Gram Stain (surgical/deep wound)     Status: None   Collection Time: 10/26/20  9:44 PM   Specimen: PATH Other; Tissue  Result Value Ref Range Status   Specimen Description ABSCESS  Final   Special Requests LEFT  Final   Gram Stain   Final    ABUNDANT WBC PRESENT, PREDOMINANTLY PMN FEW GRAM POSITIVE COCCI IN CLUSTERS MODERATE GRAM POSITIVE RODS    Culture   Final    MODERATE METHICILLIN RESISTANT STAPHYLOCOCCUS AUREUS MODERATE EIKENELLA CORRODENS Usually susceptible to penicillin and other beta lactam agents,quinolones,macrolides and tetracyclines. NO ANAEROBES ISOLATED Performed at [facility], [place] [address] [place], [place] [address]    Report Status 11/01/2020 FINAL  Final   Organism ID, Bacteria METHICILLIN RESISTANT STAPHYLOCOCCUS AUREUS  Final       Susceptibility   Methicillin resistant staphylococcus aureus - MIC*    CIPROFLOXACIN >=8 RESISTANT Resistant     ERYTHROMYCIN >=8 RESISTANT Resistant     GENTAMICIN <=0.5 SENSITIVE Sensitive     OXACILLIN >=4 RESISTANT Resistant     TETRACYCLINE >=16 RESISTANT Resistant     VANCOMYCIN <=0.5 SENSITIVE Sensitive     TRIMETH/SULFA 160 RESISTANT Resistant     CLINDAMYCIN <=0.25 SENSITIVE Sensitive     RIFAMPIN <=0.5 SENSITIVE Sensitive     Inducible Clindamycin NEGATIVE Sensitive     * MODERATE METHICILLIN RESISTANT STAPHYLOCOCCUS AUREUS  Culture, blood (routine x 2)     Status: None   Collection Time: 10/27/20  1:46 PM   Specimen: BLOOD  Result Value Ref Range Status   Specimen Description BLOOD RIGHT ANTECUBITAL  Final   Special Requests   Final    BOTTLES DRAWN AEROBIC AND ANAEROBIC Blood Culture adequate volume   Culture   Final    NO GROWTH 5 DAYS Performed at [facility], [address], [place], [place] [address]    Report Status 11/01/2020 FINAL  Final  Culture, blood (routine x 2)     Status: None   Collection Time: 10/27/20  1:54 PM   Specimen: BLOOD RIGHT FOREARM  Result Value Ref Range Status   Specimen Description BLOOD RIGHT FOREARM  Final   Special Requests   Final    BOTTLES DRAWN AEROBIC ONLY Blood Culture adequate volume   Culture   Final    NO GROWTH 5 DAYS Performed at [facility], [place] [address] [place], [place] [address]    Report Status 11/01/2020  FINAL  Final    RADIOLOGY STUDIES/RESULTS: ECHO TEE  Result Date: 11/01/2020    TRANSESOPHOGEAL ECHO REPORT   Patient Name:   NEEMA BARREIRA Date of Exam: 11/01/2020 Medical Rec #:  [id number]          Height:       67.5 in Accession #:    [id number]         Weight:       189.2 lb Date of Birth:  01/10/1986           BSA:          1.986 m Patient Age:    45 years           BP:           132/90 mmHg Patient Gender: F                  HR:           61 bpm. Exam Location:  Inpatient Procedure:  Transesophageal Echo, Color Doppler and 3D Echo Indications:     Bacteremia R78.81  History:         Patient has prior history of Echocardiogram examinations, most                  recent 10/30/2020. Risk Factors:IV Drug User.  Sonographer:     Dustin Flock Referring Phys:  [place] Diagnosing Phys: Oswaldo Milian MD PROCEDURE: After discussion of the risks and benefits of a TEE, an informed consent was obtained. The transesophogeal probe was passed without difficulty through the esophogus of the patient. Sedation performed by different physician. The patient was monitored while under deep sedation. Anesthestetic sedation was provided intravenously by Anesthesiology: 441.39m of Propofol. The patient developed no complications during the procedure. IMPRESSIONS  1. Left ventricular ejection fraction, by estimation, is 40 to 45%. The left ventricle has mildly decreased function. There is the interventricular septum is flattened in systole and diastole, consistent with right ventricular pressure and volume overload.  2. Right ventricular systolic function is moderately reduced. The right ventricular size is severely enlarged.  3. No left atrial/left atrial appendage thrombus was detected.  4. Right atrial size was severely dilated.  5. The mitral valve is normal in structure. Trivial mitral valve regurgitation.  6. The tricuspid valve is abnormal. Tricuspid valve regurgitation is severe. Mobile calcified mass on ventricular side of tricuspid valve, suspect healed vegetation versus torn chord from prior endocarditis.  7. The aortic valve is tricuspid. Aortic valve regurgitation is not visualized. No aortic stenosis is present.  8. Agitated saline contrast bubble study was positive with shunting observed within 3-6 cardiac cycles suggestive of interatrial shunt. FINDINGS  Left Ventricle: Left ventricular ejection fraction, by estimation, is 40 to 45%. The left ventricle has mildly decreased  function. The left ventricular internal cavity size was normal in size. The interventricular septum is flattened in systole and diastole, consistent with right ventricular pressure and volume overload. Right Ventricle: The right ventricular size is severely enlarged. Right vetricular wall thickness was not well visualized. Right ventricular systolic function is moderately reduced. Left Atrium: Left atrial size was normal in size. No left atrial/left atrial appendage thrombus was detected. Right Atrium: Right atrial size was severely dilated. Pericardium: There is no evidence of pericardial effusion. Mitral Valve: The mitral valve is normal in structure. Trivial mitral valve regurgitation. Tricuspid Valve: The tricuspid valve is abnormal. Tricuspid valve regurgitation is severe. Aortic Valve: The aortic valve  is tricuspid. Aortic valve regurgitation is not visualized. No aortic stenosis is present. Pulmonic Valve: The pulmonic valve was grossly normal. Pulmonic valve regurgitation is trivial. Aorta: The aortic root and ascending aorta are structurally normal, with no evidence of dilitation. IAS/Shunts: No atrial level shunt detected by color flow Doppler. Agitated saline contrast was given intravenously to evaluate for intracardiac shunting. Agitated saline contrast bubble study was positive with shunting observed within 3-6 cardiac cycles suggestive of interatrial shunt.  TRICUSPID VALVE TR Peak grad:   23.8 mmHg TR Vmax:        244.00 cm/s Oswaldo Milian MD Electronically signed by Oswaldo Milian MD Signature Date/Time: 11/01/2020/3:27:53 PM    Final      LOS: 8 days   Signature  Lala Lund M.D on 11/03/2020 at 11:13 AM   -  To page go to [email or web address]

## 2020-11-04 ENCOUNTER — Other Ambulatory Visit (HOSPITAL_COMMUNITY): Payer: Self-pay

## 2020-11-04 LAB — CBC WITH DIFFERENTIAL/PLATELET
Abs Immature Granulocytes: 0.04 10*3/uL (ref 0.00–0.07)
Basophils Absolute: 0.1 10*3/uL (ref 0.0–0.1)
Basophils Relative: 1 %
Eosinophils Absolute: 0.1 10*3/uL (ref 0.0–0.5)
Eosinophils Relative: 2 %
HCT: 37.2 % (ref 36.0–46.0)
Hemoglobin: 11.6 g/dL — ABNORMAL LOW (ref 12.0–15.0)
Immature Granulocytes: 1 %
Lymphocytes Relative: 34 %
Lymphs Abs: 2.9 10*3/uL (ref 0.7–4.0)
MCH: 27.8 pg (ref 26.0–34.0)
MCHC: 31.2 g/dL (ref 30.0–36.0)
MCV: 89.2 fL (ref 80.0–100.0)
Monocytes Absolute: 0.5 10*3/uL (ref 0.1–1.0)
Monocytes Relative: 6 %
Neutro Abs: 4.9 10*3/uL (ref 1.7–7.7)
Neutrophils Relative %: 56 %
Platelets: 572 10*3/uL — ABNORMAL HIGH (ref 150–400)
RBC: 4.17 MIL/uL (ref 3.87–5.11)
RDW: 15.9 % — ABNORMAL HIGH (ref 11.5–15.5)
WBC: 8.6 10*3/uL (ref 4.0–10.5)
nRBC: 0 % (ref 0.0–0.2)

## 2020-11-04 LAB — COMPREHENSIVE METABOLIC PANEL
ALT: 6 U/L (ref 0–44)
AST: 12 U/L — ABNORMAL LOW (ref 15–41)
Albumin: 3.1 g/dL — ABNORMAL LOW (ref 3.5–5.0)
Alkaline Phosphatase: 96 U/L (ref 38–126)
Anion gap: 10 (ref 5–15)
BUN: 20 mg/dL (ref 6–20)
CO2: 30 mmol/L (ref 22–32)
Calcium: 9.8 mg/dL (ref 8.9–10.3)
Chloride: 96 mmol/L — ABNORMAL LOW (ref 98–111)
Creatinine, Ser: 1.05 mg/dL — ABNORMAL HIGH (ref 0.44–1.00)
GFR, Estimated: >60 mL/min (ref >60)
Glucose, Bld: 91 mg/dL (ref 70–99)
Potassium: 4.5 mmol/L (ref 3.5–5.1)
Sodium: 136 mmol/L (ref 135–145)
Total Bilirubin: 0.6 mg/dL (ref 0.3–1.2)
Total Protein: 7.1 g/dL (ref 6.5–8.1)

## 2020-11-04 LAB — MAGNESIUM: Magnesium: 2.3 mg/dL (ref 1.7–2.4)

## 2020-11-04 LAB — C-REACTIVE PROTEIN: CRP: 1.1 mg/dL — ABNORMAL HIGH (ref <1.0)

## 2020-11-04 LAB — PROCALCITONIN: Procalcitonin: <0.1 ng/mL

## 2020-11-04 MED ORDER — LINEZOLID 600 MG PO TABS
600.0000 mg | ORAL_TABLET | Freq: Two times a day (BID) | ORAL | 0 refills | Status: AC
  Filled 2020-11-04: qty 24, 12d supply, fill #0

## 2020-11-04 MED ORDER — COMPLETENATE 29-1 MG PO CHEW: 1.0000 | CHEWABLE_TABLET | Freq: Every day | ORAL | 2 refills | Status: DC

## 2020-11-04 MED ORDER — AMOXICILLIN-POT CLAVULANATE 875-125 MG PO TABS
1.0000 | ORAL_TABLET | Freq: Two times a day (BID) | ORAL | Status: DC
  Administered 2020-11-04: 1 via ORAL
  Filled 2020-11-04: qty 1

## 2020-11-04 MED ORDER — AMOXICILLIN-POT CLAVULANATE 875-125 MG PO TABS
1.0000 | ORAL_TABLET | Freq: Two times a day (BID) | ORAL | 0 refills | Status: AC
  Filled 2020-11-04: qty 24, 12d supply, fill #0

## 2020-11-04 MED ORDER — LINEZOLID 600 MG PO TABS
600.0000 mg | ORAL_TABLET | Freq: Two times a day (BID) | ORAL | Status: DC
  Administered 2020-11-04: 600 mg via ORAL
  Filled 2020-11-04: qty 1

## 2020-11-04 NOTE — Discharge Summary (Signed)
Kelly [name] SVX:[id number] DOB: [birth year] DOA: 10/26/2020  PCP: [facility]  Admit date: 10/26/2020  Discharge date: 11/04/2020  Admitted From: Home  Disposition:  Home per SW   Recommendations for Outpatient Follow-up:   Follow up with PCP in 1-2 weeks  PCP Please obtain BMP/CBC, 2 view CXR in 1week,  (see Discharge instructions)   PCP Please follow up on the following pending results: Needs outpatient ID, hand surgery and general surgery follow-up in 1 to 2 weeks.  Also cardiology follow-up in 1 month's time.   Home Health: None   Equipment/Devices: None  Consultations: IDCCS, Hand Discharge Condition: Stable    CODE STATUS: Full    Diet Recommendation: Heart Healthy   Diet Order             Diet - low sodium heart healthy           DIET SOFT Room service appropriate? Yes; Fluid consistency: Thin  Diet effective now                    CC Fever    Brief history of present illness from the day of admission and additional interim summary    Patient is a 35 y.o. 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  She has a history of IV heroin/cocaine use-but claims she has been clean for several months.  UDS negative.   Significant events: 6/15>> admit for left breast/right thigh/left hand abscess.   Significant studies: 5/12>> TTE: EF 23-30%, RV systolic function moderately reduced.  Severe TR. 6/15>> CXR: No pneumonia 6/15>> x-ray left hand: No fracture/dislocation-dorsal soft tissue swelling   Antimicrobial therapy: Vancomycin: 6/15>>   Microbiology data: 6/15>> COVID/influenza PCR: Negative 6/15>> abscess cultures: Staff aureus  (sensitivities pending) 6/16>> blood cultures: No growth   Procedures :   6/15>> I&D of left breast/right thigh/left thigh/left hand abscess by general surgery and orthopedics.   6/21 - by [facility] Incision and drainage of right thigh abscess, 2 x 2 cm involving subcutaneous fat Incision and drainage of right lateral thigh abscess, 2 x 2 cm, also involving subcutaneous fat   6/21 - TEE -   1. Left ventricular ejection fraction, by estimation, is 40 to 45%. The left ventricle has mildly decreased function. There is the interventricular septum is flattened in systole and diastole, consistent with right ventricular pressure and volume overload.  2. Right ventricular systolic function is moderately reduced. The right ventricular size is severely enlarged.  3. No left atrial/left atrial appendage thrombus was detected.  4. Right atrial size was severely dilated.  5. The mitral valve is normal in structure. Trivial mitral valve regurgitation.  6. The tricuspid valve is abnormal. Tricuspid valve regurgitation is severe. Mobile calcified mass on ventricular side of tricuspid valve, suspect healed vegetation versus torn chord from prior endocarditis.  7. The aortic valve is tricuspid. Aortic valve regurgitation is not  visualized. No aortic stenosis is present.  8. Agitated saline contrast bubble study was positive with shunting observed within 3-6 cardiac cycles suggestive of interatrial shunt                                                                 Hospital Course      Left breast/right/left thigh and left hand abscess & right thigh abscess: S/p I&D by general surgery/hand surgery-wound cultures positive for staph aureus-remains on IV Vancomycin and Unasyn.  ID also following.  Procalcitonin and blood cultures so far negative, TEE noted, case discussed with ID physician Dr. Linus Salmons, 12 more days of oral antibiotics which will be Zyvox and Augmentin.  Outpatient follow-up with general surgery, hand surgery  and ID post discharge.  Again counseled not to do IV drugs.   History of recurrent tricuspid valve endocarditis (MSSA 2019, Enterococcus 2020, MRSA 2022): Concerned that she may have underlying endocarditis given her presentation-unfortunately blood cultures were done after patient received antibiotics-ID is following and directing antibiotics.  Repeat TTE stable, TEE noted with possible healed tricuspid vegetation and positive bubble study suggesting PFO with intra-atrial shunt.     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   Recent history of C. difficile colitis - denies any diarrhea, resolved.   History of syphilis: Stable for further treatment by ID MD outpatient setting.  Will follow with Dr. Linus Salmons.   Recent vaginal birth/delivery: She is not breast-feeding-claims that her 1 month child is  with family members.   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   Discharge diagnosis     Active Problems:   Abscess   Leg abscess    Discharge instructions    Discharge Instructions     Diet - low sodium heart healthy   Complete by: As directed    Discharge instructions   Complete by: As directed    Follow with Primary MD Pa, [facility] in 7 days   Get CBC, CMP, 2 view Chest X ray -  checked next visit within 1 week by Primary MD   Activity: As tolerated with Full fall precautions use walker/cane & assistance as needed  Disposition Home   Diet: Heart Healthy    Special Instructions: If you have smoked or chewed Tobacco  in the last 2 yrs please stop smoking, stop any regular Alcohol  and or any Recreational drug use.  On your next visit with your primary care physician please Get Medicines reviewed and adjusted.  Please request your Prim.MD to go over all Hospital Tests and Procedure/Radiological results at the follow up, please get all Hospital records sent to your  Prim MD by signing hospital release before you go home.  If you experience worsening of your admission symptoms, develop shortness of breath, life threatening emergency, suicidal or homicidal thoughts you must seek medical attention immediately by calling 911 or calling your MD immediately  if symptoms less severe.  You Must read complete instructions/literature along with all the possible adverse reactions/side effects for all the Medicines you take and that have been prescribed to you. Take any new Medicines after you have completely understood and accpet all the possible adverse reactions/side effects.  Do not drive, operate heavy machinery, perform activities at heights, swimming or participation in water activities or provide baby sitting services if your were admitted for syncope or siezures until you have seen by Primary MD or a Neurologist and advised to do so again.  Do not drive when taking Pain medications.  Do not take more than prescribed Pain, Sleep and Anxiety Medications  Wear Seat belts while driving.   Please note  You were cared for by a hospitalist during your hospital stay. If you have any questions about your discharge medications or the care you received while you were in the hospital after you are discharged, you can call the unit and asked to speak with the hospitalist on call if the hospitalist that took care of you is not available. Once you are discharged, your primary care physician will handle any further medical issues. Please note that NO REFILLS for any discharge medications will be authorized once you are discharged, as it is imperative that you return to your primary care physician (or establish a relationship with a primary care physician if you do not have one) for your aftercare needs so that they can reassess your need for medications and monitor your lab values.   Discharge wound care:   Complete by: As directed    Please keep all your incision sites and  wound clean and dry at all times.  Please review wound care instructions in the discharge instructions section.   Increase activity slowly   Complete by: As directed        Discharge Medications   Allergies as of 11/04/2020       Reactions   Hydrocodone Itching   Morphine And Related Nausea And Vomiting        Medication List     STOP taking these medications    Dificid 200 MG Tabs tablet Generic drug: fidaxomicin   ibuprofen 600 MG tablet Commonly known as: ADVIL   loperamide 2 MG capsule Commonly known as: IMODIUM       TAKE these medications    amoxicillin-clavulanate 875-125 MG tablet Commonly known as: AUGMENTIN Take 1 tablet by mouth every 12 (twelve) hours for 12 days.   furosemide 40 MG tablet Commonly known as: LASIX Take 1 tablet (40 mg total) by mouth daily.   linezolid 600 MG tablet Commonly known as: ZYVOX Take 1 tablet (600 mg total) by mouth every 12 (twelve) hours for 12 days.   METHADONE HCL DISKETS PO Take 130 mg by mouth in the morning.   methadone 40 MG disintegrating tablet Commonly known as: METHADOSE Take 130 mg by mouth daily.   NIFEdipine 30 MG 24 hr tablet Commonly known as: ADALAT CC Take 1 tablet (30 mg total) by mouth daily.   prenatal vitamin w/FE, FA 29-1 MG Chew chewable tablet Chew 1 tablet by mouth daily at 12 noon. What changed: when to take this   promethazine 25 MG tablet Commonly known as: PHENERGAN Take 25 mg by mouth every 8 (eight) hours as needed for nausea or vomiting.   sertraline 50 MG tablet Commonly known as: ZOLOFT Take 1 tablet (50 mg total) by mouth daily.               Discharge Care Instructions  (From admission, onward)           Start     Ordered   11/04/20 0000  Discharge wound care:       Comments: Please keep all your incision  sites and wound clean and dry at all times.  Please review wound care instructions in the discharge instructions section.   11/04/20 0926              Follow-up [facility]. Call on 11/24/2020.   Specialty: General Surgery Why: 11/24/20 at 1:45 pm for wound re-check. Please call to confirm appointment time. We are working on scheduling this for Warden/ranger information: [place] Edgard Walsh, [facility]. Schedule an appointment as soon as possible for a visit in 1 week(s).   Contact information: [place] [id number] [phone]         Thayer Headings, MD. Schedule an appointment as soon as possible for a visit in 1 week(s).   Specialty: Infectious Diseases Contact information: [address] [place] [address] [phone]         [facility] [place]. Schedule an appointment as soon as possible for a visit in 1 week(s).   Specialty: General Surgery Contact information: [place]        Iran Planas, MD. Schedule an appointment as soon as possible for a visit in 1 week(s).   Specialty: Orthopedic Surgery Contact information: [address] [place] [address] [place] [address] [phone]                 Major procedures and Radiology Reports - PLEASE review detailed and final reports thoroughly  -       DG Chest 1 View  Result Date: 10/26/2020 CLINICAL DATA:  Cyst swelling EXAM: CHEST  1 VIEW COMPARISON:  07/15/2020 FINDINGS: Extensive spinal hardware. Linear atelectasis or scar at the right base. No consolidation, pleural effusion or pneumothorax. Borderline to mild cardiomegaly. IMPRESSION: No active disease. Borderline to mild cardiomegaly with linear atelectasis or scar at the right base Electronically Signed   By: Donavan Foil M.D.   On: 10/26/2020 17:07   DG Hand 2 View Left  Result Date: 10/26/2020 CLINICAL DATA:  Swelling. EXAM: LEFT HAND - 2 VIEW  COMPARISON:  None. FINDINGS: There is no evidence of fracture or dislocation. There is no evidence of arthropathy or other focal bone abnormality. Dorsal soft tissue swelling. IMPRESSION: 1. No acute fracture or dislocation identified about the left hand. 2. Dorsal soft tissue swelling. Electronically Signed   By: Fidela Salisbury M.D.   On: 10/26/2020 16:58   ECHO TEE  Result Date: 11/01/2020    TRANSESOPHOGEAL ECHO REPORT   Patient Name:   PATTON SWISHER Date of Exam: 11/01/2020 Medical Rec #:  [id number]          Height:       67.5 in Accession #:    [id number]         Weight:       189.2 lb Date of Birth:  [birth year]           BSA:          1.986 m Patient Age:    35 years           BP:           132/90 mmHg Patient Gender: F                  HR:  61 bpm. Exam Location:  Inpatient Procedure: Transesophageal Echo, Color Doppler and 3D Echo Indications:     Bacteremia R78.81  History:         Patient has prior history of Echocardiogram examinations, most                  recent 10/30/2020. Risk Factors:IV Drug User.  Sonographer:     Dustin Flock Referring Phys:  [place] Diagnosing Phys: Oswaldo Milian MD PROCEDURE: After discussion of the risks and benefits of a TEE, an informed consent was obtained. The transesophogeal probe was passed without difficulty through the esophogus of the patient. Sedation performed by different physician. The patient was monitored while under deep sedation. Anesthestetic sedation was provided intravenously by Anesthesiology: 441.29m of Propofol. The patient developed no complications during the procedure. IMPRESSIONS  1. Left ventricular ejection fraction, by estimation, is 40 to 45%. The left ventricle has mildly decreased function. There is the interventricular septum is flattened in systole and diastole, consistent with right ventricular pressure and volume overload.  2. Right ventricular systolic function is moderately reduced. The  right ventricular size is severely enlarged.  3. No left atrial/left atrial appendage thrombus was detected.  4. Right atrial size was severely dilated.  5. The mitral valve is normal in structure. Trivial mitral valve regurgitation.  6. The tricuspid valve is abnormal. Tricuspid valve regurgitation is severe. Mobile calcified mass on ventricular side of tricuspid valve, suspect healed vegetation versus torn chord from prior endocarditis.  7. The aortic valve is tricuspid. Aortic valve regurgitation is not visualized. No aortic stenosis is present.  8. Agitated saline contrast bubble study was positive with shunting observed within 3-6 cardiac cycles suggestive of interatrial shunt. FINDINGS  Left Ventricle: Left ventricular ejection fraction, by estimation, is 40 to 45%. The left ventricle has mildly decreased function. The left ventricular internal cavity size was normal in size. The interventricular septum is flattened in systole and diastole, consistent with right ventricular pressure and volume overload. Right Ventricle: The right ventricular size is severely enlarged. Right vetricular wall thickness was not well visualized. Right ventricular systolic function is moderately reduced. Left Atrium: Left atrial size was normal in size. No left atrial/left atrial appendage thrombus was detected. Right Atrium: Right atrial size was severely dilated. Pericardium: There is no evidence of pericardial effusion. Mitral Valve: The mitral valve is normal in structure. Trivial mitral valve regurgitation. Tricuspid Valve: The tricuspid valve is abnormal. Tricuspid valve regurgitation is severe. Aortic Valve: The aortic valve is tricuspid. Aortic valve regurgitation is not visualized. No aortic stenosis is present. Pulmonic Valve: The pulmonic valve was grossly normal. Pulmonic valve regurgitation is trivial. Aorta: The aortic root and ascending aorta are structurally normal, with no evidence of dilitation. IAS/Shunts: No  atrial level shunt detected by color flow Doppler. Agitated saline contrast was given intravenously to evaluate for intracardiac shunting. Agitated saline contrast bubble study was positive with shunting observed within 3-6 cardiac cycles suggestive of interatrial shunt.  TRICUSPID VALVE TR Peak grad:   23.8 mmHg TR Vmax:        244.00 cm/s COswaldo MilianMD Electronically signed by COswaldo MilianMD Signature Date/Time: 11/01/2020/3:27:53 PM    Final    ECHOCARDIOGRAM LIMITED  Result Date: 10/30/2020    ECHOCARDIOGRAM LIMITED REPORT   Patient Name:   CJackie PlumDate of Exam: 10/30/2020 Medical Rec #:  [id number]         Height:  67.5 in Accession #:    [id number]         Weight:       189.2 lb Date of Birth:  19-Aug-1985           BSA:          1.986 m Patient Age:    35 years           BP:           136/98 mmHg Patient Gender: F                  HR:           64 bpm. Exam Location:  Inpatient Procedure: Limited Echo, Limited Color Doppler and Cardiac Doppler STAT ECHO Indications:    endocarditis  History:        Patient has prior history of Echocardiogram examinations, most                 recent 09/22/2020. Multiple abscess; Risk Factors:IV drug use.  Sonographer:    Johny Chess Referring Phys: [place]  1. Left ventricular ejection fraction, by estimation, is 40 to 45%. The left ventricle has mildly decreased function. The left ventricle demonstrates global hypokinesis.  2. Ventricular septum is flattened in diastole consistent with RV volume overload. . Right ventricular systolic function is moderately reduced. The right ventricular size is severely enlarged. There is mildly elevated pulmonary artery systolic pressure.  3. The mitral valve is normal in structure. No evidence of mitral valve regurgitation. No evidence of mitral stenosis.  4. Tricuspid valve regurgitation is severe.  5. The aortic valve is tricuspid. No aortic stenosis is present.  6. The  inferior vena cava is dilated in size with <50% respiratory variability, suggesting right atrial pressure of 15 mmHg.  7. Limited echo to evaluate for endocarditis. No visual evidence of current endocarditis FINDINGS  Left Ventricle: Left ventricular ejection fraction, by estimation, is 40 to 45%. The left ventricle has mildly decreased function. The left ventricle demonstrates global hypokinesis. The left ventricular internal cavity size was normal in size. There is  no left ventricular hypertrophy. Right Ventricle: Ventricular septum is flattened in diastole consistent with RV volume overload. The right ventricular size is severely enlarged. Right ventricular systolic function is moderately reduced. There is mildly elevated pulmonary artery systolic pressure. The tricuspid regurgitant velocity is 2.65 m/s, and with an assumed right atrial pressure of 15 mmHg, the estimated right ventricular systolic pressure is 95.6 mmHg. Pericardium: There is no evidence of pericardial effusion. Mitral Valve: The mitral valve is normal in structure. No evidence of mitral valve stenosis. Tricuspid Valve: Tricuspid valve regurgitation is severe. No evidence of tricuspid stenosis. Aortic Valve: The aortic valve is tricuspid. No aortic stenosis is present. Pulmonic Valve: The pulmonic valve was not well visualized. Pulmonic valve regurgitation is not visualized. No evidence of pulmonic stenosis. Pulmonary Artery: 28. Venous: There is systolic hepatic vein flow reversal consistent with severe TR. The inferior vena cava is dilated in size with less than 50% respiratory variability, suggesting right atrial pressure of 15 mmHg. LEFT VENTRICLE PLAX 2D LVIDd:         4.20 cm Diastology LVIDs:         3.50 cm LV e' medial:    8.81 cm/s LV PW:         1.10 cm LV E/e' medial:  6.2 LV IVS:        1.00 cm LV e' lateral:  12.60 cm/s                        LV E/e' lateral: 4.3  RIGHT VENTRICLE          IVC RV Basal diam:  5.50 cm  IVC diam: 2.40  cm RV Mid diam:    4.50 cm LEFT ATRIUM         Index LA diam:    3.20 cm 1.61 cm/m  AORTIC VALVE LVOT Vmax:   78.40 cm/s LVOT Vmean:  50.100 cm/s LVOT VTI:    0.161 m MITRAL VALVE               TRICUSPID VALVE MV Area (PHT): 2.76 cm    TR Peak grad:   28.1 mmHg MV Decel Time: 275 msec    TR Vmax:        265.00 cm/s MV E velocity: 54.40 cm/s MV A velocity: 58.70 cm/s  SHUNTS MV E/A ratio:  0.93        Systemic VTI: 0.16 m Carlyle Dolly MD Electronically signed by Carlyle Dolly MD Signature Date/Time: 10/30/2020/10:21:30 AM    Final     Micro Results    Recent Results (from the past 240 hour(s))  Resp Panel by RT-PCR (Flu A&B, Covid) Nasopharyngeal Swab     Status: None   Collection Time: 10/26/20  7:05 PM   Specimen: Nasopharyngeal Swab; Nasopharyngeal(NP) swabs in vial transport medium  Result Value Ref Range Status   SARS Coronavirus 2 by RT PCR NEGATIVE NEGATIVE Final    Comment: (NOTE) SARS-CoV-2 target nucleic acids are NOT DETECTED.  The SARS-CoV-2 RNA is generally detectable in upper respiratory specimens during the acute phase of infection. The lowest concentration of SARS-CoV-2 viral copies this assay can detect is 138 copies/mL. A negative result does not preclude SARS-Cov-2 infection and should not be used as the sole basis for treatment or other patient management decisions. A negative result may occur with  improper specimen collection/handling, submission of specimen other than nasopharyngeal swab, presence of viral mutation(s) within the areas targeted by this assay, and inadequate number of viral copies(<138 copies/mL). A negative result must be combined with clinical observations, patient history, and epidemiological information. The expected result is Negative.  Fact Sheet for Patients:  EntrepreneurPulse.com.au  Fact Sheet for Healthcare Providers:  IncredibleEmployment.be  This test is no t yet approved or cleared by the  [place] FDA and  has been authorized for detection and/or diagnosis of SARS-CoV-2 by FDA under an Emergency Use Authorization (EUA). This EUA will remain  in effect (meaning this test can be used) for the duration of the COVID-19 declaration under Section 564(b)(1) of the Act, 21 U.S.C.section 360bbb-3(b)(1), unless the authorization is terminated  or revoked sooner.       Influenza A by PCR NEGATIVE NEGATIVE Final   Influenza B by PCR NEGATIVE NEGATIVE Final    Comment: (NOTE) The Xpert Xpress SARS-CoV-2/FLU/RSV plus assay is intended as an aid in the diagnosis of influenza from Nasopharyngeal swab specimens and should not be used as a sole basis for treatment. Nasal washings and aspirates are unacceptable for Xpert Xpress SARS-CoV-2/FLU/RSV testing.  Fact Sheet for Patients: EntrepreneurPulse.com.au  Fact Sheet for Healthcare Providers: IncredibleEmployment.be  This test is not yet approved or cleared by the [place] FDA and has been authorized for detection and/or diagnosis of SARS-CoV-2 by FDA under an Emergency Use Authorization (EUA). This EUA will remain in effect (meaning this test  can be used) for the duration of the COVID-19 declaration under Section 564(b)(1) of the Act, 21 U.S.C. section 360bbb-3(b)(1), unless the authorization is terminated or revoked.  Performed at [facility], [place] [address], [place], [place] [address]   Surgical pcr screen     Status: Abnormal   Collection Time: 10/26/20  7:06 PM   Specimen: Nasal Mucosa; Nasal Swab  Result Value Ref Range Status   MRSA, PCR POSITIVE (A) NEGATIVE Final    Comment: RESULT CALLED TO, READ BACK BY AND VERIFIED WITH: A. SOUTZ RN, AT [phone] 10/27/20 D. VANHOOK    Staphylococcus aureus POSITIVE (A) NEGATIVE Final    Comment: (NOTE) The Xpert SA Assay (FDA approved for NASAL specimens in patients 31 years of age and older), is one component of a  comprehensive surveillance program. It is not intended to diagnose infection nor to guide or monitor treatment. Performed at [facility], [place] [address], [place], [place] [address]   Aerobic/Anaerobic Culture w Gram Stain (surgical/deep wound)     Status: None   Collection Time: 10/26/20  9:40 PM   Specimen: Abscess  Result Value Ref Range Status   Specimen Description ABSCESS  Final   Special Requests NONE  Final   Gram Stain   Final    MODERATE WBC PRESENT, PREDOMINANTLY PMN RARE GRAM POSITIVE COCCI IN CLUSTERS    Culture   Final    RARE METHICILLIN RESISTANT STAPHYLOCOCCUS AUREUS RARE EIKENELLA CORRODENS Usually susceptible to penicillin and other beta lactam agents,quinolones,macrolides and tetracyclines. NO ANAEROBES ISOLATED Performed at [facility], [place] [address], [place], [place] [address]    Report Status 11/01/2020 FINAL  Final   Organism ID, Bacteria METHICILLIN RESISTANT STAPHYLOCOCCUS AUREUS  Final      Susceptibility   Methicillin resistant staphylococcus aureus - MIC*    CIPROFLOXACIN >=8 RESISTANT Resistant     ERYTHROMYCIN >=8 RESISTANT Resistant     GENTAMICIN <=0.5 SENSITIVE Sensitive     OXACILLIN >=4 RESISTANT Resistant     TETRACYCLINE >=16 RESISTANT Resistant     VANCOMYCIN <=0.5 SENSITIVE Sensitive     TRIMETH/SULFA >=320 RESISTANT Resistant     CLINDAMYCIN <=0.25 SENSITIVE Sensitive     RIFAMPIN <=0.5 SENSITIVE Sensitive     Inducible Clindamycin NEGATIVE Sensitive     * RARE METHICILLIN RESISTANT STAPHYLOCOCCUS AUREUS  Aerobic/Anaerobic Culture w Gram Stain (surgical/deep wound)     Status: None   Collection Time: 10/26/20  9:44 PM   Specimen: PATH Other; Tissue  Result Value Ref Range Status   Specimen Description ABSCESS  Final   Special Requests LEFT  Final   Gram Stain   Final    ABUNDANT WBC PRESENT, PREDOMINANTLY PMN FEW GRAM POSITIVE COCCI IN CLUSTERS MODERATE GRAM POSITIVE RODS    Culture   Final    MODERATE  METHICILLIN RESISTANT STAPHYLOCOCCUS AUREUS MODERATE EIKENELLA CORRODENS Usually susceptible to penicillin and other beta lactam agents,quinolones,macrolides and tetracyclines. NO ANAEROBES ISOLATED Performed at [facility], [place] [address] [place], [place] [address]    Report Status 11/01/2020 FINAL  Final   Organism ID, Bacteria METHICILLIN RESISTANT STAPHYLOCOCCUS AUREUS  Final      Susceptibility   Methicillin resistant staphylococcus aureus - MIC*    CIPROFLOXACIN >=8 RESISTANT Resistant     ERYTHROMYCIN >=8 RESISTANT Resistant     GENTAMICIN <=0.5 SENSITIVE Sensitive     OXACILLIN >=4 RESISTANT Resistant     TETRACYCLINE >=16 RESISTANT Resistant  VANCOMYCIN <=0.5 SENSITIVE Sensitive     TRIMETH/SULFA 160 RESISTANT Resistant     CLINDAMYCIN <=0.25 SENSITIVE Sensitive     RIFAMPIN <=0.5 SENSITIVE Sensitive     Inducible Clindamycin NEGATIVE Sensitive     * MODERATE METHICILLIN RESISTANT STAPHYLOCOCCUS AUREUS  Culture, blood (routine x 2)     Status: None   Collection Time: 10/27/20  1:46 PM   Specimen: BLOOD  Result Value Ref Range Status   Specimen Description BLOOD RIGHT ANTECUBITAL  Final   Special Requests   Final    BOTTLES DRAWN AEROBIC AND ANAEROBIC Blood Culture adequate volume   Culture   Final    NO GROWTH 5 DAYS Performed at [facility], [place] [address], [place], [place] [address]    Report Status 11/01/2020 FINAL  Final  Culture, blood (routine x 2)     Status: None   Collection Time: 10/27/20  1:54 PM   Specimen: BLOOD RIGHT FOREARM  Result Value Ref Range Status   Specimen Description BLOOD RIGHT FOREARM  Final   Special Requests   Final    BOTTLES DRAWN AEROBIC ONLY Blood Culture adequate volume   Culture   Final    NO GROWTH 5 DAYS Performed at [facility], [place] [address] [place], [place] [address]    Report Status 11/01/2020 FINAL  Final    Today   Subjective    Kelly [name] today has no headache,no chest abdominal  pain,no new weakness tingling or numbness, feels much better wants to go home today.   Objective   Blood pressure 130/90, pulse 63, temperature 98.4 F (36.9 C), temperature source Oral, resp. rate 17, height 5' 7.5" (1.715 m), weight 85.8 kg, SpO2 98 %, unknown if currently breastfeeding.   Intake/Output Summary (Last 24 hours) at 11/04/2020 0931 Last data filed at 11/03/2020 1200 Gross per 24 hour  Intake 340 ml  Output --  Net 340 ml    Exam  Awake Alert, No new F.N deficits, Normal affect Brent.AT,PERRAL Supple Neck,No JVD, No cervical lymphadenopathy appriciated.  Symmetrical Chest wall movement, Good air movement bilaterally, CTAB RRR,No Gallops,Rubs or new Murmurs, No Parasternal Heave +ve B.Sounds, Abd Soft, Non tender, No organomegaly appriciated, No rebound -guarding or rigidity. No Cyanosis, left arm under bandage, right thigh and a bandage,   Data Review   CBC w Diff:  Lab Results  Component Value Date   WBC 8.6 11/04/2020   HGB 11.6 (L) 11/04/2020   HGB 11.3 09/04/2018   HCT 37.2 11/04/2020   HCT 33.4 (L) 09/04/2018   PLT 572 (H) 11/04/2020   PLT 372 09/04/2018   LYMPHOPCT 34 11/04/2020   LYMPHOPCT 36.7 07/10/2013   MONOPCT 6 11/04/2020   MONOPCT 10.2 07/10/2013   EOSPCT 2 11/04/2020   EOSPCT 2.5 07/10/2013   BASOPCT 1 11/04/2020   BASOPCT 1.2 07/10/2013    CMP:  Lab Results  Component Value Date   NA 136 11/04/2020   NA 136 07/02/2013   K 4.5 11/04/2020   K 3.7 07/02/2013   CL 96 (L) 11/04/2020   CL 102 07/02/2013   CO2 30 11/04/2020   CO2 30 07/02/2013   BUN 20 11/04/2020   BUN 7 07/02/2013   CREATININE 1.05 (H) 11/04/2020   CREATININE 0.82 07/02/2013   PROT 7.1 11/04/2020   PROT 7.7 07/10/2013   ALBUMIN 3.1 (L) 11/04/2020   ALBUMIN 3.4 07/10/2013   BILITOT 0.6 11/04/2020   BILITOT 1.9 (H) 07/10/2013   ALKPHOS 96  11/04/2020   ALKPHOS 120 (H) 07/10/2013   AST 12 (L) 11/04/2020   AST 115 (H) 07/10/2013   ALT 6 11/04/2020   ALT 95 (H)  07/10/2013  .   Total Time in preparing paper work, data evaluation and todays exam - 25 minutes  Lala Lund M.D on 11/04/2020 at 9:31 AM  [facility]

## 2020-11-04 NOTE — TOC Transition Note (Signed)
Transition of Care [facility]) - CM/SW Discharge Note   Patient Details  Name: Kelly [name] MRN: [id number] Date of Birth: [birth year]  [facility]) CM/SW Contact:  Benard Halsted, LCSW Phone Number: 11/04/2020, 11:12 AM   Clinical Narrative:    CSW arranged Uber/Lyft through [facility] transportation to pick patient up tomorrow at 5am at address provided by patient ([address] [place] [address]) to go to Methadone Clinic. The trac phone number given to her by Heart Failure CSW is [phone]   Provided patient with number for [facility] in case she needs to transfer back to [facility] methadone clinic. Also provided list of [employer]. No other needs identified at this time.      Final next level of care: Home/Self Care Barriers to Discharge: Continued Medical Work up   Patient Goals and CMS Choice        Discharge Placement                       Discharge Plan and Services In-house Referral: Clinical Social Work Discharge Planning Services: CM Consult                                 Social Determinants of Health (SDOH) Interventions     Readmission Risk Interventions Readmission Risk Prevention Plan 08/25/2020 07/30/2018  Transportation Screening Complete Complete  Transportation Screening Comment [facility] to provide transportation for the patient. -  PCP or Specialist Appt within 5-7 Days Complete -  Not Complete comments 08/29/20 OB/GYN appointment scheduled -  Home Care Screening Complete -  Medication Review (RN Care Manager) - Complete  PCP or Specialist appointment within 3-5 days of discharge - Complete  HRI or [place] - Not Complete  HRI or Home Care Consult Pt Refusal Comments - N/A  SW Recovery Care/Counseling Consult - Complete  Palliative Care Screening - Not Complete  Comments - N/A  Skilled Nursing Facility - Not Complete  SNF Comments - N/A  Some recent data might be hidden

## 2020-11-04 NOTE — Progress Notes (Signed)
Oral Antibiotics At Discharge   Indication: MRSA/Eikenella skin abscesses  Oral antibiotic at discharge: Linezolid 600 mg BID and Augmentin 875 mg BID   Stop Date: 11/16/20      Jimmy Footman, PharmD, BCPS, BCIDP Infectious Diseases Clinical Pharmacist Phone: [phone] 11/04/2020

## 2020-11-04 NOTE — Progress Notes (Signed)
Patient was discharged from [phone]. AVS reviewed and all questions answered. Prescription given and TOC meds delivered to room. IV d/c'd, VS stable. Patient has no other questions at this time. All belongings taken with patient. Patient will be taken to the main entrance to meet her transportation.

## 2020-11-04 NOTE — TOC Progression Note (Addendum)
Transition of Care [name]) - Progression Note    Patient Details  Name: Kelly [name] MRN: [id number] Date of Birth: July 13, 1985  Transition of Care [facility]) CM/SW [place], LCSW Phone Number: 11/04/2020, 8:33 AM  Clinical Narrative:    8:33am-CSW received return call from [facility] and they are unable to accept patient's insurance.   CSW left voicemail for patient's methadone clinic, [facility], to see if she will need to complete a new intake or if she can receive treatment there on Saturday.  10am-CSW contacted [facility] again. They were able to tell CSW that patient does not dose with them anymore and that she transferred to a center in [place].   CSW contacted [facility] and they confirmed patient receives her Methadone there. She is able to receive her methadone dose tomorrow morning (5am-8am) as long as she brings a MAR showing she received a dose today.    Expected Discharge Plan: Home/Self Care Barriers to Discharge: Continued Medical Work up  Expected Discharge Plan and Services Expected Discharge Plan: Home/Self Care In-house Referral: Clinical Social Work Discharge Planning Services: CM Consult   Living arrangements for the past 2 months: [place] Determinants of Health (SDOH) Interventions    Readmission Risk Interventions Readmission Risk Prevention Plan 08/25/2020 07/30/2018  Transportation Screening Complete Complete  Transportation Screening Comment Rooms at the [facility] to provide transportation for the patient. -  PCP or Specialist Appt within 5-7 Days Complete -  Not Complete comments 08/29/20 OB/GYN appointment scheduled -  Home Care Screening Complete -  Medication Review (RN Care Manager) - Complete  PCP or Specialist appointment within 3-5 days of discharge - Complete  HRI or [place] - Not Complete  HRI or Home Care Consult Pt  Refusal Comments - N/A  SW Recovery Care/Counseling Consult - Complete  Palliative Care Screening - Not Complete  Comments - N/A  Skilled Nursing Facility - Not Complete  SNF Comments - N/A  Some recent data might be hidden

## 2020-11-15 ENCOUNTER — Inpatient Hospital Stay: Payer: Medicaid Other | Admitting: Internal Medicine

## 2020-11-28 ENCOUNTER — Encounter (HOSPITAL_COMMUNITY): Payer: Medicaid Other | Admitting: Internal Medicine

## 2020-11-30 ENCOUNTER — Ambulatory Visit (HOSPITAL_COMMUNITY): Payer: Medicaid Other | Admitting: Licensed Clinical Social Worker

## 2020-12-05 ENCOUNTER — Other Ambulatory Visit: Payer: Self-pay

## 2020-12-05 ENCOUNTER — Ambulatory Visit (HOSPITAL_COMMUNITY): Payer: Medicaid Other

## 2021-01-19 ENCOUNTER — Emergency Department (HOSPITAL_COMMUNITY): Payer: Medicaid Other

## 2021-01-19 ENCOUNTER — Other Ambulatory Visit: Payer: Self-pay

## 2021-01-19 ENCOUNTER — Inpatient Hospital Stay (HOSPITAL_COMMUNITY)
Admission: EM | Admit: 2021-01-19 | Discharge: 2021-02-22 | DRG: 853 | Disposition: A | Discharge status: Home / Self Care | Payer: Medicaid Other | Attending: Internal Medicine | Admitting: Internal Medicine

## 2021-01-19 DIAGNOSIS — M6008 Infective myositis, other site: Secondary | ICD-10-CM | POA: Diagnosis present

## 2021-01-19 DIAGNOSIS — Z9151 Personal history of suicidal behavior: Secondary | ICD-10-CM

## 2021-01-19 DIAGNOSIS — I011 Acute rheumatic endocarditis: Secondary | ICD-10-CM | POA: Diagnosis present

## 2021-01-19 DIAGNOSIS — R7881 Bacteremia: Secondary | ICD-10-CM

## 2021-01-19 DIAGNOSIS — I76 Septic arterial embolism: Secondary | ICD-10-CM | POA: Diagnosis present

## 2021-01-19 DIAGNOSIS — I2690 Septic pulmonary embolism without acute cor pulmonale: Secondary | ICD-10-CM | POA: Diagnosis present

## 2021-01-19 DIAGNOSIS — M79604 Pain in right leg: Secondary | ICD-10-CM | POA: Diagnosis not present

## 2021-01-19 DIAGNOSIS — E875 Hyperkalemia: Secondary | ICD-10-CM | POA: Diagnosis present

## 2021-01-19 DIAGNOSIS — K029 Dental caries, unspecified: Secondary | ICD-10-CM | POA: Diagnosis present

## 2021-01-19 DIAGNOSIS — F1123 Opioid dependence with withdrawal: Secondary | ICD-10-CM | POA: Diagnosis not present

## 2021-01-19 DIAGNOSIS — M8618 Other acute osteomyelitis, other site: Secondary | ICD-10-CM | POA: Diagnosis present

## 2021-01-19 DIAGNOSIS — I38 Endocarditis, valve unspecified: Secondary | ICD-10-CM | POA: Diagnosis not present

## 2021-01-19 DIAGNOSIS — M549 Dorsalgia, unspecified: Secondary | ICD-10-CM | POA: Diagnosis not present

## 2021-01-19 DIAGNOSIS — G928 Other toxic encephalopathy: Secondary | ICD-10-CM | POA: Diagnosis present

## 2021-01-19 DIAGNOSIS — D6489 Other specified anemias: Secondary | ICD-10-CM | POA: Diagnosis present

## 2021-01-19 DIAGNOSIS — Z79899 Other long term (current) drug therapy: Secondary | ICD-10-CM

## 2021-01-19 DIAGNOSIS — M009 Pyogenic arthritis, unspecified: Secondary | ICD-10-CM | POA: Diagnosis present

## 2021-01-19 DIAGNOSIS — Z885 Allergy status to narcotic agent status: Secondary | ICD-10-CM

## 2021-01-19 DIAGNOSIS — M86159 Other acute osteomyelitis, unspecified femur: Secondary | ICD-10-CM | POA: Diagnosis not present

## 2021-01-19 DIAGNOSIS — L02415 Cutaneous abscess of right lower limb: Secondary | ICD-10-CM | POA: Diagnosis present

## 2021-01-19 DIAGNOSIS — N739 Female pelvic inflammatory disease, unspecified: Secondary | ICD-10-CM | POA: Diagnosis present

## 2021-01-19 DIAGNOSIS — B9562 Methicillin resistant Staphylococcus aureus infection as the cause of diseases classified elsewhere: Secondary | ICD-10-CM | POA: Diagnosis present

## 2021-01-19 DIAGNOSIS — B1920 Unspecified viral hepatitis C without hepatic coma: Secondary | ICD-10-CM | POA: Diagnosis present

## 2021-01-19 DIAGNOSIS — R339 Retention of urine, unspecified: Secondary | ICD-10-CM | POA: Diagnosis present

## 2021-01-19 DIAGNOSIS — F1410 Cocaine abuse, uncomplicated: Secondary | ICD-10-CM | POA: Diagnosis present

## 2021-01-19 DIAGNOSIS — F1990 Other psychoactive substance use, unspecified, uncomplicated: Secondary | ICD-10-CM | POA: Diagnosis not present

## 2021-01-19 DIAGNOSIS — A419 Sepsis, unspecified organism: Secondary | ICD-10-CM | POA: Diagnosis not present

## 2021-01-19 DIAGNOSIS — I071 Rheumatic tricuspid insufficiency: Secondary | ICD-10-CM

## 2021-01-19 DIAGNOSIS — R9431 Abnormal electrocardiogram [ECG] [EKG]: Secondary | ICD-10-CM | POA: Diagnosis present

## 2021-01-19 DIAGNOSIS — Z7901 Long term (current) use of anticoagulants: Secondary | ICD-10-CM

## 2021-01-19 DIAGNOSIS — A4102 Sepsis due to Methicillin resistant Staphylococcus aureus: Principal | ICD-10-CM | POA: Diagnosis present

## 2021-01-19 DIAGNOSIS — K5900 Constipation, unspecified: Secondary | ICD-10-CM | POA: Diagnosis not present

## 2021-01-19 DIAGNOSIS — I110 Hypertensive heart disease with heart failure: Secondary | ICD-10-CM | POA: Diagnosis present

## 2021-01-19 DIAGNOSIS — E871 Hypo-osmolality and hyponatremia: Secondary | ICD-10-CM | POA: Diagnosis present

## 2021-01-19 DIAGNOSIS — Z20822 Contact with and (suspected) exposure to covid-19: Secondary | ICD-10-CM | POA: Diagnosis present

## 2021-01-19 DIAGNOSIS — F1910 Other psychoactive substance abuse, uncomplicated: Secondary | ICD-10-CM | POA: Diagnosis present

## 2021-01-19 DIAGNOSIS — R6520 Severe sepsis without septic shock: Secondary | ICD-10-CM

## 2021-01-19 DIAGNOSIS — Z87891 Personal history of nicotine dependence: Secondary | ICD-10-CM

## 2021-01-19 DIAGNOSIS — I5022 Chronic systolic (congestive) heart failure: Secondary | ICD-10-CM | POA: Diagnosis present

## 2021-01-19 DIAGNOSIS — I079 Rheumatic tricuspid valve disease, unspecified: Secondary | ICD-10-CM | POA: Diagnosis present

## 2021-01-19 DIAGNOSIS — R262 Difficulty in walking, not elsewhere classified: Secondary | ICD-10-CM | POA: Diagnosis present

## 2021-01-19 DIAGNOSIS — F988 Other specified behavioral and emotional disorders with onset usually occurring in childhood and adolescence: Secondary | ICD-10-CM | POA: Diagnosis present

## 2021-01-19 DIAGNOSIS — Z833 Family history of diabetes mellitus: Secondary | ICD-10-CM

## 2021-01-19 DIAGNOSIS — I2721 Secondary pulmonary arterial hypertension: Secondary | ICD-10-CM | POA: Diagnosis present

## 2021-01-19 DIAGNOSIS — I2699 Other pulmonary embolism without acute cor pulmonale: Secondary | ICD-10-CM | POA: Diagnosis not present

## 2021-01-19 DIAGNOSIS — N760 Acute vaginitis: Secondary | ICD-10-CM | POA: Diagnosis present

## 2021-01-19 DIAGNOSIS — R188 Other ascites: Secondary | ICD-10-CM

## 2021-01-19 DIAGNOSIS — R042 Hemoptysis: Secondary | ICD-10-CM | POA: Diagnosis not present

## 2021-01-19 DIAGNOSIS — Z8249 Family history of ischemic heart disease and other diseases of the circulatory system: Secondary | ICD-10-CM

## 2021-01-19 DIAGNOSIS — M79605 Pain in left leg: Secondary | ICD-10-CM | POA: Diagnosis not present

## 2021-01-19 DIAGNOSIS — E222 Syndrome of inappropriate secretion of antidiuretic hormone: Secondary | ICD-10-CM | POA: Diagnosis present

## 2021-01-19 DIAGNOSIS — T40605A Adverse effect of unspecified narcotics, initial encounter: Secondary | ICD-10-CM | POA: Diagnosis not present

## 2021-01-19 DIAGNOSIS — R609 Edema, unspecified: Secondary | ICD-10-CM

## 2021-01-19 DIAGNOSIS — K5903 Drug induced constipation: Secondary | ICD-10-CM | POA: Diagnosis not present

## 2021-01-19 LAB — BLOOD CULTURE ID PANEL (REFLEXED) - BCID2

## 2021-01-19 LAB — PROTIME-INR
INR: 1.3 — ABNORMAL HIGH (ref 0.8–1.2)
Prothrombin Time: 15.9 seconds — ABNORMAL HIGH (ref 11.4–15.2)

## 2021-01-19 LAB — URINALYSIS, ROUTINE W REFLEX MICROSCOPIC
Bacteria, UA: NONE SEEN
Bilirubin Urine: NEGATIVE
Glucose, UA: NEGATIVE mg/dL
Ketones, ur: NEGATIVE mg/dL
Leukocytes,Ua: NEGATIVE
Nitrite: NEGATIVE
Protein, ur: NEGATIVE mg/dL
Specific Gravity, Urine: 1.015 (ref 1.005–1.030)
pH: 5 (ref 5.0–8.0)

## 2021-01-19 LAB — COMPREHENSIVE METABOLIC PANEL
ALT: 9 U/L (ref 0–44)
AST: 13 U/L — ABNORMAL LOW (ref 15–41)
Albumin: 1.8 g/dL — ABNORMAL LOW (ref 3.5–5.0)
Alkaline Phosphatase: 195 U/L — ABNORMAL HIGH (ref 38–126)
Anion gap: 12 (ref 5–15)
BUN: 9 mg/dL (ref 6–20)
CO2: 27 mmol/L (ref 22–32)
Calcium: 8.6 mg/dL — ABNORMAL LOW (ref 8.9–10.3)
Chloride: 90 mmol/L — ABNORMAL LOW (ref 98–111)
Creatinine, Ser: 0.61 mg/dL (ref 0.44–1.00)
GFR, Estimated: >60 mL/min (ref >60)
Glucose, Bld: 125 mg/dL — ABNORMAL HIGH (ref 70–99)
Potassium: 3.1 mmol/L — ABNORMAL LOW (ref 3.5–5.1)
Sodium: 129 mmol/L — ABNORMAL LOW (ref 135–145)
Total Bilirubin: 0.6 mg/dL (ref 0.3–1.2)
Total Protein: 6.2 g/dL — ABNORMAL LOW (ref 6.5–8.1)

## 2021-01-19 LAB — CBC WITH DIFFERENTIAL/PLATELET
Abs Immature Granulocytes: 0 10*3/uL (ref 0.00–0.07)
Basophils Absolute: 0 10*3/uL (ref 0.0–0.1)
Basophils Relative: 0 %
Eosinophils Absolute: 0 10*3/uL (ref 0.0–0.5)
Eosinophils Relative: 0 %
HCT: 33.3 % — ABNORMAL LOW (ref 36.0–46.0)
Hemoglobin: 10.5 g/dL — ABNORMAL LOW (ref 12.0–15.0)
Lymphocytes Relative: 2 %
Lymphs Abs: 0.4 10*3/uL — ABNORMAL LOW (ref 0.7–4.0)
MCH: 24.6 pg — ABNORMAL LOW (ref 26.0–34.0)
MCHC: 31.5 g/dL (ref 30.0–36.0)
MCV: 78.2 fL — ABNORMAL LOW (ref 80.0–100.0)
Monocytes Absolute: 0.8 10*3/uL (ref 0.1–1.0)
Monocytes Relative: 4 %
Neutro Abs: 17.9 10*3/uL — ABNORMAL HIGH (ref 1.7–7.7)
Neutrophils Relative %: 94 %
Platelets: 272 10*3/uL (ref 150–400)
RBC: 4.26 MIL/uL (ref 3.87–5.11)
RDW: 18 % — ABNORMAL HIGH (ref 11.5–15.5)
WBC: 19 10*3/uL — ABNORMAL HIGH (ref 4.0–10.5)
nRBC: 0 % (ref 0.0–0.2)
nRBC: 0 /100 WBC

## 2021-01-19 LAB — GC/CHLAMYDIA PROBE AMP (~~LOC~~) NOT AT ARMC
Chlamydia: NEGATIVE
Comment: NEGATIVE
Comment: NORMAL
Neisseria Gonorrhea: NEGATIVE

## 2021-01-19 LAB — BRAIN NATRIURETIC PEPTIDE: B Natriuretic Peptide: 439.1 pg/mL — ABNORMAL HIGH (ref 0.0–100.0)

## 2021-01-19 LAB — APTT: aPTT: 29 seconds (ref 24–36)

## 2021-01-19 LAB — WET PREP, GENITAL
Sperm: NONE SEEN
Trich, Wet Prep: NONE SEEN
Yeast Wet Prep HPF POC: NONE SEEN

## 2021-01-19 LAB — I-STAT BETA HCG BLOOD, ED (MC, WL, AP ONLY): I-stat hCG, quantitative: <5 m[IU]/mL (ref <5)

## 2021-01-19 LAB — HIV ANTIBODY (ROUTINE TESTING W REFLEX): HIV Screen 4th Generation wRfx: NONREACTIVE

## 2021-01-19 LAB — ECHOCARDIOGRAM COMPLETE
AR max vel: 3.42 cm2
AV Area VTI: 3.05 cm2
AV Area mean vel: 3.34 cm2
AV Mean grad: 5 mmHg
AV Peak grad: 8.8 mmHg
Ao pk vel: 1.48 m/s
Area-P 1/2: 7.59 cm2
Height: 67 in
S' Lateral: 3.2 cm
Single Plane A4C EF: 50 %
Weight: 2880 oz

## 2021-01-19 LAB — RESP PANEL BY RT-PCR (FLU A&B, COVID) ARPGX2
Influenza A by PCR: NEGATIVE
Influenza B by PCR: NEGATIVE
SARS Coronavirus 2 by RT PCR: NEGATIVE

## 2021-01-19 LAB — LACTIC ACID, PLASMA
Lactic Acid, Venous: 2.6 mmol/L (ref 0.5–1.9)
Lactic Acid, Venous: 2.4 mmol/L (ref 0.5–1.9)
Lactic Acid, Venous: 1 mmol/L (ref 0.5–1.9)

## 2021-01-19 LAB — RPR
RPR Ser Ql: REACTIVE — AB
RPR Titer: 1:1 {titer}

## 2021-01-19 LAB — C-REACTIVE PROTEIN: CRP: 33.9 mg/dL — ABNORMAL HIGH (ref <1.0)

## 2021-01-19 MED ORDER — METRONIDAZOLE 500 MG/100ML IV SOLN
500.0000 mg | Freq: Once | INTRAVENOUS | Status: AC
  Administered 2021-01-19: 500 mg via INTRAVENOUS
  Filled 2021-01-19: qty 100

## 2021-01-19 MED ORDER — HYDROMORPHONE HCL 1 MG/ML IJ SOLN: 1.0000 mg | INTRAMUSCULAR | Status: DC | PRN

## 2021-01-19 MED ORDER — ACETAMINOPHEN 325 MG PO TABS
650.0000 mg | ORAL_TABLET | ORAL | Status: DC | PRN
  Administered 2021-01-19 – 2021-02-21 (×25): 650 mg via ORAL
  Filled 2021-01-19 (×25): qty 2

## 2021-01-19 MED ORDER — FENTANYL CITRATE PF 50 MCG/ML IJ SOSY
PREFILLED_SYRINGE | INTRAMUSCULAR | Status: AC
  Administered 2021-01-19: 100 ug
  Filled 2021-01-19: qty 2

## 2021-01-19 MED ORDER — KETOROLAC TROMETHAMINE 15 MG/ML IJ SOLN
15.0000 mg | Freq: Once | INTRAMUSCULAR | Status: AC
  Administered 2021-01-19: 15 mg via INTRAVENOUS
  Filled 2021-01-19: qty 1

## 2021-01-19 MED ORDER — CHLORHEXIDINE GLUCONATE CLOTH 2 % EX PADS
6.0000 | MEDICATED_PAD | Freq: Every day | CUTANEOUS | Status: DC
  Administered 2021-01-20 – 2021-02-21 (×29): 6 via TOPICAL

## 2021-01-19 MED ORDER — LORAZEPAM 2 MG/ML IJ SOLN
1.0000 mg | Freq: Once | INTRAMUSCULAR | Status: AC
  Administered 2021-01-19: 1 mg via INTRAVENOUS
  Filled 2021-01-19: qty 1

## 2021-01-19 MED ORDER — ACETAMINOPHEN 325 MG PO TABS
650.0000 mg | ORAL_TABLET | Freq: Once | ORAL | Status: AC
  Administered 2021-01-19: 650 mg via ORAL
  Filled 2021-01-19: qty 2

## 2021-01-19 MED ORDER — VANCOMYCIN HCL 1.25 G IV SOLR
1250.0000 mg | Freq: Two times a day (BID) | INTRAVENOUS | Status: DC
  Administered 2021-01-19: 1250 mg via INTRAVENOUS
  Filled 2021-01-19 (×2): qty 25

## 2021-01-19 MED ORDER — METRONIDAZOLE 500 MG/100ML IV SOLN: 500.0000 mg | Freq: Three times a day (TID) | INTRAVENOUS | Status: DC

## 2021-01-19 MED ORDER — ENOXAPARIN SODIUM 40 MG/0.4ML IJ SOSY
40.0000 mg | PREFILLED_SYRINGE | INTRAMUSCULAR | Status: DC
  Administered 2021-01-19 – 2021-02-18 (×30): 40 mg via SUBCUTANEOUS
  Filled 2021-01-19 (×30): qty 0.4

## 2021-01-19 MED ORDER — SENNOSIDES-DOCUSATE SODIUM 8.6-50 MG PO TABS
1.0000 | ORAL_TABLET | Freq: Two times a day (BID) | ORAL | Status: DC
  Administered 2021-01-19 – 2021-02-01 (×16): 1 via ORAL
  Filled 2021-01-19 (×18): qty 1

## 2021-01-19 MED ORDER — SODIUM CHLORIDE 0.9 % IV SOLN: 2.0000 g | Freq: Three times a day (TID) | INTRAVENOUS | Status: DC

## 2021-01-19 MED ORDER — HYDROMORPHONE HCL 1 MG/ML IJ SOLN
1.0000 mg | Freq: Once | INTRAMUSCULAR | Status: AC
  Administered 2021-01-19: 1 mg via INTRAVENOUS
  Filled 2021-01-19: qty 1

## 2021-01-19 MED ORDER — HYDROMORPHONE HCL 1 MG/ML IJ SOLN
0.5000 mg | INTRAMUSCULAR | Status: DC | PRN
Start: 2021-01-19 — End: 2021-01-20
  Administered 2021-01-19 – 2021-01-20 (×8): 0.5 mg via INTRAVENOUS
  Filled 2021-01-19 (×2): qty 1
  Filled 2021-01-19 (×6): qty 0.5

## 2021-01-19 MED ORDER — POTASSIUM CHLORIDE CRYS ER 20 MEQ PO TBCR
40.0000 meq | EXTENDED_RELEASE_TABLET | Freq: Two times a day (BID) | ORAL | Status: DC
  Administered 2021-01-19 – 2021-01-20 (×4): 40 meq via ORAL
  Filled 2021-01-19 (×4): qty 2

## 2021-01-19 MED ORDER — SODIUM CHLORIDE 0.9 % IV SOLN
2.0000 g | Freq: Once | INTRAVENOUS | Status: AC
  Administered 2021-01-19: 2 g via INTRAVENOUS
  Filled 2021-01-19: qty 2

## 2021-01-19 MED ORDER — LACTATED RINGERS IV BOLUS
500.0000 mL | Freq: Once | INTRAVENOUS | Status: AC
  Administered 2021-01-19: 500 mL via INTRAVENOUS

## 2021-01-19 MED ORDER — VANCOMYCIN HCL 1.5 G IV SOLR
1500.0000 mg | Freq: Once | INTRAVENOUS | Status: AC
  Administered 2021-01-19: 1500 mg via INTRAVENOUS
  Filled 2021-01-19: qty 30

## 2021-01-19 MED ORDER — LACTATED RINGERS IV SOLN: INTRAVENOUS | Status: DC

## 2021-01-19 MED ORDER — FENTANYL CITRATE PF 50 MCG/ML IJ SOSY
100.0000 ug | PREFILLED_SYRINGE | Freq: Once | INTRAMUSCULAR | Status: AC
Start: 2021-01-19 — End: 2021-01-19
  Administered 2021-01-19: 100 ug via INTRAVENOUS
  Filled 2021-01-19: qty 2

## 2021-01-19 MED ORDER — IOHEXOL 350 MG/ML SOLN
80.0000 mL | Freq: Once | INTRAVENOUS | Status: AC | PRN
  Administered 2021-01-19: 80 mL via INTRAVENOUS

## 2021-01-19 NOTE — ED Notes (Signed)
Nurse tech went to check on patient and found that pt Ivs were removed. Pt states she does not know how they were removed. IV accessed reobtained my [facility] via ultrasound

## 2021-01-19 NOTE — Progress Notes (Signed)
[facility]link is following Code Sepsis

## 2021-01-19 NOTE — ED Notes (Signed)
Bolus complete.

## 2021-01-19 NOTE — ED Triage Notes (Signed)
Pt bib [facility] due to increasing abdominal pain x3 days. Reports dysuria, back pain, and fevers x 1 week. Pt was hypotensive with EMXS at 80's palpated. 300-400cc of fluid given. Tachycardic at 140's to 106 after fluids.  Hx of CHF and IV drug user, clear lung sounds noted with EMS

## 2021-01-19 NOTE — ED Notes (Signed)
Dr. Tonie Griffith notified of latest BP and temperature

## 2021-01-19 NOTE — ED Notes (Signed)
Dr. Tonie Griffith notified of pt BP of temp. PRN tylenol given. Per Dr. Tonie Griffith keep close eye on temp and update him when needed.

## 2021-01-19 NOTE — ED Notes (Signed)
Patient transported to CT 

## 2021-01-19 NOTE — H&P (Addendum)
History and Physical    Kelly [name] VVZ:[id number] DOB: 1985/07/25 DOA: 01/19/2021  Referring MD/NP/PA: Godfrey Pick, MD PCP: Pcp, No  Patient coming from: Via EMS  Chief Complaint: Follow-up pain  I have personally briefly reviewed patient's old medical records in [place]   HPI: Kelly [name] is a 35 y.o. female with medical history significant of HTN, systolic CHF with moderate pulmonary hypertension and severe TR, polysubstance abuse including history of IV drug use, recurrent history of endocarditis, C. difficile colitis complaints of 3 to 4-day history of pelvic pain.  History is difficult to obtain from the patient due to lethargy.  She describes the abdominal pain as "horrible" and urgency with inability to urinate.  Notes associated symptoms of subjective fever, lower back pain, falls due to difficulty walking, vomiting, constipation, and left knee pain.  Patient states that she has been injecting IV heroin in her arms, legs, and wherever she could.  Also endorses smoking cocaine intermittently, but unable to tell last use.  Records note patient was last hospitalized from 6/15-6/24 for multiple abscesses status post I&D with cultures positive for staph aureus treated on IV vancomycin and Unasyn.  Blood cultures were noted to be negative.  Repeat TEE noted possible healed tricuspid vegetation with positive bubble study suggesting PFO with interatrial shunt.  Patient ultimately was discharged to complete course of Zyvox and amoxicillin.  Unclear if patient actually did this.  In route with EMS patient was noted to be hypotensive with systolic blood pressures in the 80s palpated with tachycardia into the 140s.  She was given 300-400 cc of IV fluids with improvement in tachycardia and down to 106.  ED Course: Upon admission to the emergency department patient was seen to be febrile up to 100.7 F with tachycardia and tachypnea.  Patient was placed on 2 L of nasal cannula  oxygen, but was not documented to be hypoxic.  Labs significant for WBC 19, globin 10.5, sodium 129, potassium 3.1, alkaline phosphatase 195, albumin 1.8, BNP 439.1, INR 2.4, and INR 1.3.  COVID-19 screening was negative.  Wet prep was obtained showing presence of clue cells and many WBCs.  Urinalysis significant for blood present, but no significant signs of infection.  Bladder scan noted 798 mL of urine present for which a Foley catheter was placed.  Chest x-ray showed patchy bilateral airspace opacities concerning for pneumonia.  Code sepsis was initiated, blood cultures obtained, and patient was given lactated ringer fluid bolus of 500 mL then placed on rate of 150 mL/h.  Patient had been given acetaminophen pain, multiple pain medications, vancomycin, cefepime, and metronidazole.  CT scan of the abdomen noted innumerable pulmonary nodules within the lung bases which demonstrated cavitary changes suspicious for innumerable septic emboli mild cardiomegaly, and moderate stool burden without obstruction.  Due to the urinary retention MRI of the spine was obtained but did not show any signs of epidural abscess echocardiogram has been ordered and pending.   Review of Systems  Unable to perform ROS: Mental status change  Constitutional:  Positive for fever.  Eyes:  Negative for pain.  Respiratory:  Negative for cough and shortness of breath.   Cardiovascular:  Negative for chest pain.  Gastrointestinal:  Positive for abdominal pain, constipation, nausea and vomiting.  Genitourinary:  Positive for urgency.  Musculoskeletal:  Positive for falls and joint pain.  Neurological:  Positive for weakness.  Psychiatric/Behavioral:  Positive for substance abuse.    Past Medical History:  Diagnosis Date  Abscess in epidural space of L2-L5 lumbar spine 12/05/2017   Abscess of breast    rt breast   Abscess of right breast 07/13/2014   Acute cystitis without hematuria    ADD (attention deficit disorder)    Back  pain    Bacteremia due to Enterococcus 06/15/2018   Bipolar 1 disorder (HCC)    Endocarditis 2020   Foreign body granuloma of soft tissue, NEC, right upper arm    History of hepatitis C 10/01/2018   Hypertension    Infection of left wrist ([place]) 10/26/2020   Influenza B 06/17/2018   Leukocytosis    Multifocal pneumonia 06/14/2018   Polysubstance abuse ([place])    Pyelonephritis affecting pregnancy 05/24/2016   Sepsis ([employer]) 12/02/2017   Suicidal overdose ([place])    Thigh abscess 10/26/2020   Vaginal discharge 10/26/2020    Past Surgical History:  Procedure Laterality Date   BACK SURGERY     scoliosis-in care everywhere 08/1999   BUBBLE STUDY  11/01/2020   Procedure: BUBBLE STUDY;  Surgeon: Donato Heinz, MD;  Location: [facility];  Service: Cardiovascular;;   CESAREAN SECTION     CHOLECYSTECTOMY  March 2015   FOREIGN BODY REMOVAL Right 05/28/2016   Procedure: REMOVAL FOREIGN BODY RIGHT ARM;  Surgeon: Meredith Pel, MD;  Location: [facility];  Service: Orthopedics;  Laterality: Right;   I & D EXTREMITY Left 10/26/2020   Procedure: IRRIGATION AND DEBRIDEMENT LEFT WRIST;  Surgeon: Iran Planas, MD;  Location: [facility];  Service: Orthopedics;  Laterality: Left;   INCISION AND DRAINAGE ABSCESS Right 11/01/2020   Procedure: INCISION AND DRAINAGE ABSCESS RIGHT THIGH X 2;  Surgeon: Ileana Roup, MD;  Location: [facility];  Service: General;  Laterality: Right;   IRRIGATION AND DEBRIDEMENT ABSCESS Right 07/13/2014   Procedure: IRRIGATION AND DEBRIDEMEN TRIGHT BREAST ABSCESS;  Surgeon: Donnie Mesa, MD;  Location: [facility];  Service: General;  Laterality: Right;   LUMBAR LAMINECTOMY/DECOMPRESSION MICRODISCECTOMY N/A 12/05/2017   Procedure: LUMBAR LAMINECTOMY FOR EPIDURAL ABSCESS;  Surgeon: Newman Pies, MD;  Location: [facility];  Service: Neurosurgery;  Laterality: N/A;   MINOR IRRIGATION AND DEBRIDEMENT OF WOUND Left 10/26/2020   Procedure: MINOR IRRIGATION AND DEBRIDEMENT OF LEFT BREAST AND BILATERAL  THIGHS;  Surgeon: Iran Planas, MD;  Location: [facility];  Service: Orthopedics;  Laterality: Left;   MULTIPLE EXTRACTIONS WITH ALVEOLOPLASTY Bilateral 07/03/2018   Procedure: Extraction of tooth #'s 30 and 31 with alveoloplasty;  Surgeon: Lenn Cal, DDS;  Location: [place] [facility];  Service: Oral Surgery;  Laterality: Bilateral;   RADIOLOGY WITH ANESTHESIA N/A 12/05/2017   Procedure: MRI WITH ANESTHESIA OF [facility] AND LUMBAR SPINE WITH AND WITHOUT;  Surgeon: Radiologist, Medication, MD;  Location: [facility];  Service: Radiology;  Laterality: N/A;   RIGHT/LEFT HEART CATH AND CORONARY ANGIOGRAPHY N/A 05/21/2019   Procedure: RIGHT/LEFT HEART CATH AND CORONARY ANGIOGRAPHY;  Surgeon: Jolaine Artist, MD;  Location: [place] [facility];  Service: Cardiovascular;  Laterality: N/A;   TEE WITHOUT CARDIOVERSION N/A 05/21/2019   Procedure: TRANSESOPHAGEAL ECHOCARDIOGRAM (TEE);  Surgeon: Jolaine Artist, MD;  Location: [facility] ENDOSCOPY;  Service: Cardiovascular;  Laterality: N/A;   TEE WITHOUT CARDIOVERSION N/A 11/01/2020   Procedure: TRANSESOPHAGEAL ECHOCARDIOGRAM (TEE);  Surgeon: Donato Heinz, MD;  Location: [name] ENDOSCOPY;  Service: Cardiovascular;  Laterality: N/A;   TUBAL LIGATION Bilateral 09/30/2020   Procedure: POST PARTUM TUBAL LIGATION;  Surgeon: Osborne Oman, MD;  Location: [facility];  Service: Gynecology;  Laterality: Bilateral;  reports that she quit smoking about 2 years ago. Her smoking use included cigarettes. She has a 13.00 pack-year smoking history. She has never used smokeless tobacco. She reports that she does not currently use alcohol. She reports current drug use. Drugs: Cocaine, Heroin, IV, and Fentanyl.  Allergies  Allergen Reactions   Hydrocodone Itching   Morphine And Related Nausea And Vomiting    Family History  Problem Relation Age of Onset   Diabetes Mother    Diabetes Father    Hypertension Father    Heart attack Neg Hx    Hyperlipidemia Neg Hx    Sudden death  Neg Hx     Prior to Admission medications   Medication Sig Start Date End Date Taking? Authorizing Provider  furosemide (LASIX) 40 MG tablet Take 1 tablet (40 mg total) by mouth daily. 08/26/20   Aletha Halim, MD  methadone (METHADOSE) 40 MG disintegrating tablet Take 130 mg by mouth daily.    [provider]  METHADONE HCL DISKETS PO Take 130 mg by mouth in the morning.    [provider]  NIFEdipine (ADALAT CC) 30 MG 24 hr tablet Take 1 tablet (30 mg total) by mouth daily. 10/05/20   Woodroe Mode, MD  prenatal vitamin w/FE, FA (NATACHEW) 29-1 MG CHEW chewable tablet Chew 1 tablet by mouth daily at 12 noon. 11/04/20   Thurnell Lose, MD  promethazine (PHENERGAN) 25 MG tablet Take 25 mg by mouth every 8 (eight) hours as needed for nausea or vomiting.    [provider]  sertraline (ZOLOFT) 50 MG tablet Take 1 tablet (50 mg total) by mouth daily. 08/26/20   Aletha Halim, MD    Physical Exam:  Constitutional: NAD, calm, comfortable Vitals:   01/19/21 0630 01/19/21 0700 01/19/21 0854 01/19/21 1004  BP: (!) 145/97 (!) 137/103 121/77 (!) 128/99  Pulse: (!) 107 (!) 118 (!) 109 (!) 107  Resp: (!) 30 (!) 22 (!) 25 (!) 29  Temp: 100.1 F (37.8 C) (!) 100.5 F (38.1 C) (!) 100.5 F (38.1 C)   TempSrc:      SpO2: 98% 98% 96% 100%  Weight:      Height:       Eyes: PERRL, lids and conjunctivae normal ENMT: Mucous membranes are dry. Posterior pharynx clear of any exudate or lesions. Poor dentition with chipped front teeth and dental cavities Neck: normal, supple, no masses, no thyromegaly.  JVD appreciated. Respiratory: Tachypneic with O2 saturation currently maintained on 2 L of nasal cannula oxygen.  No significant wheezes or rhonchi appreciated. Cardiovascular: Tachycardic with positive 2/6 murmur appreciated.  No extremity edema. 2+ pedal pulses. No carotid bruits.  Abdomen: Mild distention with no significant tenderness palpation after Foley catheter  placed and draining yellow urine.  Hepatosplenomegaly. Bowel sounds positive.  Musculoskeletal: no clubbing / cyanosis. No joint deformity upper and lower extremities. Good ROM, no contractures. Normal muscle tone.  Skin: Multiple track marks appreciated of the upper and lower extremities Neurologic: CN 2-12 grossly intact.  Appears able to move all extremities Psychiatric: Poor insight.  Lethargic but appears oriented to person and place.    Labs on Admission: I have personally reviewed following labs and imaging studies  CBC: Recent Labs  Lab 01/19/21 0030  WBC 19.0*  NEUTROABS 17.9*  HGB 10.5*  HCT 33.3*  MCV 78.2*  PLT 947   Basic Metabolic Panel: Recent Labs  Lab 01/19/21 0030  NA 129*  K 3.1*  CL 90*  CO2 27  GLUCOSE 125*  BUN 9  CREATININE 0.61  CALCIUM 8.6*   GFR: Estimated Creatinine Clearance: 106.8 mL/min (by C-G formula based on SCr of 0.61 mg/dL). Liver Function Tests: Recent Labs  Lab 01/19/21 0030  AST 13*  ALT 9  ALKPHOS 195*  BILITOT 0.6  PROT 6.2*  ALBUMIN 1.8*   No results for input(s): LIPASE, AMYLASE in the last 168 hours. No results for input(s): AMMONIA in the last 168 hours. Coagulation Profile: Recent Labs  Lab 01/19/21 0030  INR 1.3*   Cardiac Enzymes: No results for input(s): CKTOTAL, CKMB, CKMBINDEX, TROPONINI in the last 168 hours. BNP (last 3 results) No results for input(s): PROBNP in the last 8760 hours. HbA1C: No results for input(s): HGBA1C in the last 72 hours. CBG: No results for input(s): GLUCAP in the last 168 hours. Lipid Profile: No results for input(s): CHOL, HDL, LDLCALC, TRIG, CHOLHDL, LDLDIRECT in the last 72 hours. Thyroid Function Tests: No results for input(s): TSH, T4TOTAL, FREET4, T3FREE, THYROIDAB in the last 72 hours. Anemia Panel: No results for input(s): VITAMINB12, FOLATE, FERRITIN, TIBC, IRON, RETICCTPCT in the last 72 hours. Urine analysis:    Component Value Date/Time   COLORURINE YELLOW  01/19/2021 0015   APPEARANCEUR CLEAR 01/19/2021 0015   APPEARANCEUR Hazy 07/10/2013 1121   LABSPEC 1.015 01/19/2021 0015   LABSPEC 1.020 07/10/2013 1121   PHURINE 5.0 01/19/2021 0015   GLUCOSEU NEGATIVE 01/19/2021 0015   GLUCOSEU Negative 07/10/2013 1121   HGBUR MODERATE (A) 01/19/2021 0015   BILIRUBINUR NEGATIVE 01/19/2021 0015   BILIRUBINUR Negative 07/10/2013 1121   KETONESUR NEGATIVE 01/19/2021 0015   PROTEINUR NEGATIVE 01/19/2021 0015   UROBILINOGEN 1.0 02/15/2015 0530   NITRITE NEGATIVE 01/19/2021 0015   LEUKOCYTESUR NEGATIVE 01/19/2021 0015   LEUKOCYTESUR Trace 07/10/2013 1121   Sepsis Labs: Recent Results (from the past 240 hour(s))  Resp Panel by RT-PCR (Flu A&B, Covid) Nasopharyngeal Swab     Status: None   Collection Time: 01/19/21 12:21 AM   Specimen: Nasopharyngeal Swab; Nasopharyngeal(NP) swabs in vial transport medium  Result Value Ref Range Status   SARS Coronavirus 2 by RT PCR NEGATIVE NEGATIVE Final    Comment: (NOTE) SARS-CoV-2 target nucleic acids are NOT DETECTED.  The SARS-CoV-2 RNA is generally detectable in upper respiratory specimens during the acute phase of infection. The lowest concentration of SARS-CoV-2 viral copies this assay can detect is 138 copies/mL. A negative result does not preclude SARS-Cov-2 infection and should not be used as the sole basis for treatment or other patient management decisions. A negative result may occur with  improper specimen collection/handling, submission of specimen other than nasopharyngeal swab, presence of viral mutation(s) within the areas targeted by this assay, and inadequate number of viral copies(<138 copies/mL). A negative result must be combined with clinical observations, patient history, and epidemiological information. The expected result is Negative.  Fact Sheet for Patients:  EntrepreneurPulse.com.au  Fact Sheet for Healthcare Providers:   IncredibleEmployment.be  This test is no t yet approved or cleared by the [place] FDA and  has been authorized for detection and/or diagnosis of SARS-CoV-2 by FDA under an Emergency Use Authorization (EUA). This EUA will remain  in effect (meaning this test can be used) for the duration of the COVID-19 declaration under Section 564(b)(1) of the Act, 21 U.S.C.section 360bbb-3(b)(1), unless the authorization is terminated  or revoked sooner.       Influenza A by PCR NEGATIVE NEGATIVE Final   Influenza B by PCR NEGATIVE NEGATIVE Final  Comment: (NOTE) The Xpert Xpress SARS-CoV-2/FLU/RSV plus assay is intended as an aid in the diagnosis of influenza from Nasopharyngeal swab specimens and should not be used as a sole basis for treatment. Nasal washings and aspirates are unacceptable for Xpert Xpress SARS-CoV-2/FLU/RSV testing.  Fact Sheet for Patients: EntrepreneurPulse.com.au  Fact Sheet for Healthcare Providers: IncredibleEmployment.be  This test is not yet approved or cleared by the [place] FDA and has been authorized for detection and/or diagnosis of SARS-CoV-2 by FDA under an Emergency Use Authorization (EUA). This EUA will remain in effect (meaning this test can be used) for the duration of the COVID-19 declaration under Section 564(b)(1) of the Act, 21 U.S.C. section 360bbb-3(b)(1), unless the authorization is terminated or revoked.  Performed at [facility], [place] [address], [place], [place] [address]   Blood Culture (routine x 2)     Status: None (Preliminary result)   Collection Time: 01/19/21 12:30 AM   Specimen: BLOOD RIGHT HAND  Result Value Ref Range Status   Specimen Description BLOOD RIGHT HAND  Final   Special Requests   Final    BOTTLES DRAWN AEROBIC AND ANAEROBIC Blood Culture results may not be optimal due to an inadequate volume of blood received in culture bottles   Culture   Final     NO GROWTH < 12 HOURS Performed at [facility], [place] [address], [place], [place] [address]    Report Status PENDING  Incomplete  Blood Culture (routine x 2)     Status: None (Preliminary result)   Collection Time: 01/19/21 12:34 AM   Specimen: BLOOD LEFT WRIST  Result Value Ref Range Status   Specimen Description BLOOD LEFT WRIST  Final   Special Requests   Final    BOTTLES DRAWN AEROBIC AND ANAEROBIC Blood Culture results may not be optimal due to an inadequate volume of blood received in culture bottles   Culture   Final    NO GROWTH < 12 HOURS Performed at [facility], [place] [address], [place], [place] [address]    Report Status PENDING  Incomplete  Wet prep, genital     Status: Abnormal   Collection Time: 01/19/21  5:20 AM   Specimen: PATH Cytology Cervicovaginal Ancillary Only  Result Value Ref Range Status   Yeast Wet Prep HPF POC NONE SEEN NONE SEEN Final   Trich, Wet Prep NONE SEEN NONE SEEN Final   Clue Cells Wet Prep HPF POC PRESENT (A) NONE SEEN Final   WBC, Wet Prep HPF POC MANY (A) NONE SEEN Final   Sperm NONE SEEN  Final    Comment: Performed at [facility], [place] [address] [place], [place] [address]     Radiological Exams on Admission: MR THORACIC SPINE WO CONTRAST  Result Date: 01/19/2021 CLINICAL DATA:  Mid back pain lower abdominal pain EXAM: MRI THORACIC AND LUMBAR SPINE WITHOUT CONTRAST TECHNIQUE: Multiplanar and multiecho pulse sequences of the thoracic and lumbar spine were obtained without intravenous contrast. COMPARISON:  CT abdomen/pelvis obtained earlier the same day MRI lumbar spine 08/08/2020 FINDINGS: MRI THORACIC SPINE FINDINGS The patient is status post posterior instrumented fusion from T4 through L3 with posterior decompression at L3-L4 through L5-S1. Artifact from the hardware markedly degrades evaluation of the canal and surrounding structures. Alignment: There is slight levoscoliosis of the thoracic spine centered at T8.  Sagittal alignment appears grossly within expected limits. Vertebrae: Vertebral body heights are preserved. No marrow signal abnormality is identified. Cord:  The  cord is not well evaluated. Paraspinal and other soft tissues: The paraspinal soft tissues are unremarkable. There are multiple cavitary appearing lesions in the lungs bilaterally. Disc levels: The canal and neural foramina below the T2-T3 level are not well evaluated. The levels above T2-T3 are normal. MRI LUMBAR SPINE FINDINGS Segmentation:  Standard. Alignment: There is levoscoliosis of the lumbar spine centered at L2-L3. Sagittal alignment appears normal. Vertebrae: Vertebral body heights are preserved. A focus of T2 hyperintensity in the L5 vertebral body is unchanged and may reflect a small hemangioma. There is no other marrow signal abnormality. Conus medullaris and cauda equina: The canal above the L3 level is not well evaluated. The conus is not identified. Paraspinal and other soft tissues: The paraspinal soft tissues are unremarkable. Disc levels: The spinal canal and neural foramina at L2-L3 and above are not well evaluated. There is disc desiccation and narrowing at L4-L5 and L5-S1, similar to the prior study. L3-L4: There are postsurgical changes reflecting posterior decompression. No definite high-grade spinal canal or neural foraminal stenosis. L4-L5: There are postsurgical changes reflecting posterior decompression. There is a mild disc bulge and bilateral facet arthropathy resulting in crowding of the left subarticular zone without evidence of nerve root impingement and mild to moderate left and mild right neural foraminal stenosis, not significantly changed. L5-S1: There are postsurgical changes reflecting posterior disc compression. There is a left foraminal disc protrusion and bilateral facet arthropathy resulting in moderate left neural foraminal stenosis with contact of the exiting left L5 nerve root. There is no significant spinal  canal or right neural foraminal stenosis. The disc protrusion appears slightly worsened compared to the prior study. IMPRESSION: 1. Postsurgical changes reflecting posterior instrumented fusion extending from T4 through L3 and posterior decompression at L3-L4 through L5-S1. Artifact from the hardware significantly degrades evaluation of the canal and adjacent structures at the surgical levels. 2. Left foraminal disc protrusion and facet arthropathy at L5-S1 result in moderate left neural foraminal stenosis with contact of the exiting L5 nerve root, slightly worsened since 08/08/2020. 3. No other definite high-grade spinal canal or neural foraminal stenosis in the thoracolumbar spine, within the above confines. 4. No definite marrow signal abnormality to suggest infection. 5. Cavitary appearing lesions in the lungs as seen on the same-day CT abdomen/pelvis. Recommend dedicated imaging of the chest. Electronically Signed   By: Valetta Mole M.D.   On: 01/19/2021 08:55   MR LUMBAR SPINE WO CONTRAST  Result Date: 01/19/2021 CLINICAL DATA:  Mid back pain lower abdominal pain EXAM: MRI THORACIC AND LUMBAR SPINE WITHOUT CONTRAST TECHNIQUE: Multiplanar and multiecho pulse sequences of the thoracic and lumbar spine were obtained without intravenous contrast. COMPARISON:  CT abdomen/pelvis obtained earlier the same day MRI lumbar spine 08/08/2020 FINDINGS: MRI THORACIC SPINE FINDINGS The patient is status post posterior instrumented fusion from T4 through L3 with posterior decompression at L3-L4 through L5-S1. Artifact from the hardware markedly degrades evaluation of the canal and surrounding structures. Alignment: There is slight levoscoliosis of the thoracic spine centered at T8. Sagittal alignment appears grossly within expected limits. Vertebrae: Vertebral body heights are preserved. No marrow signal abnormality is identified. Cord:  The cord is not well evaluated. Paraspinal and other soft tissues: The paraspinal soft  tissues are unremarkable. There are multiple cavitary appearing lesions in the lungs bilaterally. Disc levels: The canal and neural foramina below the T2-T3 level are not well evaluated. The levels above T2-T3 are normal. MRI LUMBAR SPINE FINDINGS Segmentation:  Standard. Alignment: There is levoscoliosis  of the lumbar spine centered at L2-L3. Sagittal alignment appears normal. Vertebrae: Vertebral body heights are preserved. A focus of T2 hyperintensity in the L5 vertebral body is unchanged and may reflect a small hemangioma. There is no other marrow signal abnormality. Conus medullaris and cauda equina: The canal above the L3 level is not well evaluated. The conus is not identified. Paraspinal and other soft tissues: The paraspinal soft tissues are unremarkable. Disc levels: The spinal canal and neural foramina at L2-L3 and above are not well evaluated. There is disc desiccation and narrowing at L4-L5 and L5-S1, similar to the prior study. L3-L4: There are postsurgical changes reflecting posterior decompression. No definite high-grade spinal canal or neural foraminal stenosis. L4-L5: There are postsurgical changes reflecting posterior decompression. There is a mild disc bulge and bilateral facet arthropathy resulting in crowding of the left subarticular zone without evidence of nerve root impingement and mild to moderate left and mild right neural foraminal stenosis, not significantly changed. L5-S1: There are postsurgical changes reflecting posterior disc compression. There is a left foraminal disc protrusion and bilateral facet arthropathy resulting in moderate left neural foraminal stenosis with contact of the exiting left L5 nerve root. There is no significant spinal canal or right neural foraminal stenosis. The disc protrusion appears slightly worsened compared to the prior study. IMPRESSION: 1. Postsurgical changes reflecting posterior instrumented fusion extending from T4 through L3 and posterior  decompression at L3-L4 through L5-S1. Artifact from the hardware significantly degrades evaluation of the canal and adjacent structures at the surgical levels. 2. Left foraminal disc protrusion and facet arthropathy at L5-S1 result in moderate left neural foraminal stenosis with contact of the exiting L5 nerve root, slightly worsened since 08/08/2020. 3. No other definite high-grade spinal canal or neural foraminal stenosis in the thoracolumbar spine, within the above confines. 4. No definite marrow signal abnormality to suggest infection. 5. Cavitary appearing lesions in the lungs as seen on the same-day CT abdomen/pelvis. Recommend dedicated imaging of the chest. Electronically Signed   By: Valetta Mole M.D.   On: 01/19/2021 08:55   CT Abdomen Pelvis W Contrast  Result Date: 01/19/2021 CLINICAL DATA:  Abdominal pain, fever, intravenous drug use, urinary retention EXAM: CT ABDOMEN AND PELVIS WITH CONTRAST TECHNIQUE: Multidetector CT imaging of the abdomen and pelvis was performed using the standard protocol following bolus administration of intravenous contrast. CONTRAST:  41m OMNIPAQUE IOHEXOL 350 MG/ML SOLN COMPARISON:  01/15/2019 FINDINGS: Lower chest: Innumerable pulmonary nodules are seen within the visualized lung bases several of which appear cavitary and are suspicious for septic emboli in this patient. No pleural effusion. Cardiac size is mildly enlarged with particular enlargement of the right heart chambers. No pericardial effusion. Hepatobiliary: No focal liver abnormality is seen. Status post cholecystectomy. No biliary dilatation. Pancreas: Unremarkable Spleen: Unremarkable Adrenals/Urinary Tract: The adrenal glands are unremarkable. The kidneys are normal. Foley catheter balloon seen within a decompressed bladder lumen. Stomach/Bowel: Moderate stool seen within the colon. The stomach, small bowel, and large bowel are otherwise unremarkable. Appendix normal. No free intraperitoneal gas or fluid.  Vascular/Lymphatic: No significant vascular findings are present. No enlarged abdominal or pelvic lymph nodes. Reproductive: Uterus and bilateral adnexa are unremarkable. Other: No abdominal wall hernia.  Rectum unremarkable. Musculoskeletal: Harrington rods are partially visualized within the thoracolumbar spine. Resection of the spinous processes of L3, L4, and L5 has been performed. Mild degenerative changes are seen within the lower lumbar spine. No acute bone abnormality. No lytic or blastic bone lesion. IMPRESSION: Innumerable pulmonary nodules  within the visualized lung bases several of which demonstrate cavitary change suspicious for innumerable septic emboli. Mild cardiomegaly with stable enlargement of the right heart structures. Moderate stool seen within the colon without evidence of obstruction. Electronically Signed   By: Fidela Salisbury M.D.   On: 01/19/2021 03:47   DG Chest Port 1 View  Result Date: 01/19/2021 CLINICAL DATA:  Questionable sepsis EXAM: PORTABLE CHEST 1 VIEW COMPARISON:  10/26/2020 FINDINGS: Cardiomegaly. Patchy bilateral airspace disease trauma left greater than right. Findings concerning for pneumonia. No effusions or acute bony abnormality. IMPRESSION: Patchy bilateral airspace opacities concerning for pneumonia. Electronically Signed   By: Rolm Baptise M.D.   On: 01/19/2021 00:39   DG Knee Left Port  Result Date: 01/19/2021 CLINICAL DATA:  Left knee pain, fall, IV drug use EXAM: PORTABLE LEFT KNEE - 1-2 VIEW COMPARISON:  None. FINDINGS: Normal alignment. No fracture or dislocation. Joint spaces are preserved. No effusion. Prepatellar soft tissue swelling is present. IMPRESSION: Prepatellar soft tissue swelling. Electronically Signed   By: Fidela Salisbury M.D.   On: 01/19/2021 02:21    EKG: Independently reviewed.  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  Initially found to be febrile up to 100.7 F with tachycardia and tachypnea.  White blood cell count elevated at 19 with lactic acid initially elevated 2.6 with improvement with IV fluids.  Chest x-ray concerning for bilateral airspace opacities suggestive of pneumonia.  CT imaging however significant for innumerable pulmonary nodules with signs consistent with cavitary lesions for which septic emboli was suspected.  Blood cultures have been obtained.  Patient has been started on empiric antibiotics of vancomycin, metronidazole, and cefepime.  Records note previous abscess cultures from 10/2020 were positive for MRSA although blood cultures were negative at that time.  TTE noted concern for tricuspid valve thickening although no definitive vegetation was appreciated. -Admit to a progressive bed -Follow-up blood culture -Follow-up ESR and CRP -N.p.o. after midnight for possible TEE -Continue empiric antibiotics of vancomycin -Tylenol as needed for fever -Recheck CBC in a.m. -Cardiology consulted for need of repeat TEE -Infectious disease consulted, we will follow-up for any further recommendations  Acute metabolic encephalopathy: Patient noted to be significantly lethargic and altered.  No focal weakness appreciated on physical exam.  Suspect possibly related with recent drug use and/or medications given initially for pain while in the emergency department. -Neurochecks -May warrant further work-up  Chronic systolic CHF severe tricuspid regurgitation pulmonary hypertension: Patient denies any complaints of shortness of breath at this time.  JVD noted on exam but no signs of lower extremity swelling.  BNP was elevated at 439.1.  Echocardiogram today noted EF to be 40 to 45% with pulmonary artery hypertension with RVSP 68.6, severe tricuspid regurgitation, and thickened tricuspid valve leaflets.  Heart function appears similar to last study done in June and at that time bubble study  noted concern for interatrial shunt. -Strict I&Os and daily weights -Discontinued aggressive IV fluids -Continue to monitor and evaluate for need of IV diuresis  Bacterial vaginosis: Acute.  Wet prep significant for clue cells as well as many WBCs present. -Continue metronidazole IV  Urinary retention: Patient presented with pelvic pain noted to have urinary retention with 788 cc found in bladder.  Foley catheter was placed.  No signs of epidural abscess noted on MRI. -Continue Foley catheter  Constipation: Patient endorses being constipated, but cannot tell me  last bowel movement.  CT did note moderate stool within the colon without signs of obstruction.  Constipation may be leading to urinary retention. -Senokot-S twice daily   Back pain left knee pain falls: Patient admitted to recently falling possibly related with drug.  X-rays of the left knee showed prepatellar swelling without any acute fracture.  MRI imaging of the back did not note concern of epidural abscess but did note left foraminal disc protrusion and moderate left neuroforaminal stenosis slightly worse than previous studies.  No signs of an epidural abscess were noted. -Will likely need PT/OT eval  Hyponatremia: Acute.  Sodium 129.  Initially thought to be a hypovolemic hyponatremia given hypotension noted by EMS.  Patient had been given 500 L bolus of lactated Ringer's and started on rate of 150 mL/h. -Continue to monitor sodium levels  Prolonged QT interval: Acute.  QTc 500 -Correct electrolyte abnormalities -Avoid QT prolonging medication  Polysubstance abuse IV drug user: Patient reports recent use of IV heroin as well as intermittent use of cocaine. -Follow-up UDS -Will need to continue to counsel on need of cessation of substance use.  DVT prophylaxis: Lovenox Code Status: Full Family Communication: None requested Disposition Plan: To be determined Consults called: Cardiology, ID, Admission status: Inpatient  as patient we will require more than 2 midnight stay for IV antibiotics and work-up for possible endocarditis  Norval Morton MD [facility]   If 7PM-7AM, please contact night-coverage   01/19/2021, 10:20 AM

## 2021-01-19 NOTE — Progress Notes (Signed)
Pharmacy Antibiotic Note  Kelly [name] is a 35 y.o. female admitted on 01/19/2021 presenting with abdominal pain, fevers, hx IVDU, concern for sepsis  Pharmacy has been consulted for Cefepime and vancomycin dosing.  Plan: Vancomycin 1500 mg IV x 1, then 1250 mg IV every 12 hours (eAUC 513, SCr 0.8 used) Cefepime 2g IV every 8 hours Monitor renal function, Cx and clinical progression to narrow Vancomycin levels as needed  Height: 5' 7"  (170.2 cm) Weight: 81.6 kg (180 lb) IBW/kg (Calculated) : 61.6  Temp (24hrs), Avg:99.8 F (37.7 C), Min:98.6 F (37 C), Max:100.7 F (38.2 C)  Recent Labs  Lab 01/19/21 0015 01/19/21 0030 01/19/21 0530  WBC  --  19.0*  --   CREATININE  --  0.61  --   LATICACIDVEN 2.6*  --  2.4*    Estimated Creatinine Clearance: 106.8 mL/min (by C-G formula based on SCr of 0.61 mg/dL).    Allergies  Allergen Reactions   Hydrocodone Itching   Morphine And Related Nausea And Vomiting    Bertis Ruddy, PharmD Clinical Pharmacist ED Pharmacist Phone # [phone] 01/19/2021 9:29 AM

## 2021-01-19 NOTE — ED Provider Notes (Signed)
Care of patient assumed from Dr. Ralene Bathe at 0730.  Patient presents with diffuse pain.  She has history of endocarditis and skin abscesses.  Work-up concerning for acute infection.  She has urinary retention and bilateral leg weakness.  Because of this, epidural abscess is suspected.  MRI has been ordered.  Due to concern of epidural abscess, antibiotics have been held pending results of MRI.  Consult neurosurgery as needed.  She will need antibiotics and admission. Physical Exam  BP 121/77   Pulse (!) 109   Temp (!) 100.5 F (38.1 C)   Resp (!) 25   Ht 5' 7"  (1.702 m)   Wt 81.6 kg   SpO2 96%   BMI 28.19 kg/m   Physical Exam Constitutional:      Appearance: She is ill-appearing.  Cardiovascular:     Rate and Rhythm: Regular rhythm. Tachycardia present.  Pulmonary:     Effort: Pulmonary effort is normal.  Skin:    General: Skin is warm.     Capillary Refill: Capillary refill takes less than 2 seconds.  Neurological:     General: No focal deficit present.  Psychiatric:        Mood and Affect: Mood is anxious.        Behavior: Behavior normal.    ED Course/Procedures     Procedures  MDM  Patient underwent MRI.  On her return, she is able to answer questions.  She continues to endorse pain everywhere.  Her blood pressure is normal.  She remains mildly tachycardic.  She has a low-grade fever.  She got Tylenol prior to shift change.  Results of MRI do not show evidence of abscess.  Broad-spectrum antibiotics were ordered.  Gentle hydration was ordered given concern of recurrence of endocarditis (epic emboli and elevated BNP).  Her last echocardiogram was in June and showed an EF of 40 to 45%.  This was during a hospitalization for soft tissue infections. Echocardiogram was ordered.  Patient was admitted for further management.       Godfrey Pick, MD 01/19/21 1750

## 2021-01-19 NOTE — Progress Notes (Signed)
Patient transferred from ED to 5W04. Patient is alert and oriented to person.Telemetry monitoring enabled, vital signs taken, and IV assessed for patency. Skin checked with Mickle Plumb and Serita Kyle RN. Fall precautions initiated. Patient bed in the locked, lowest position. Non-slip socks in place and bed alarm on. Call bell is within reach. Patient reminded to call for assistance prior to getting up and patient demonstrates use. Patient is laying comfortably in bed.

## 2021-01-19 NOTE — ED Notes (Signed)
Bladder scan complete. 788cc found in bladder.

## 2021-01-19 NOTE — ED Notes (Signed)
Pt still in MRI at this time

## 2021-01-19 NOTE — ED Provider Notes (Signed)
[facility] EMERGENCY DEPARTMENT Provider Note   CSN: [id number] Arrival date & time: 01/19/21  0004     History Chief Complaint  Patient presents with   Abdominal Pain    Kelly [name] is a 35 y.o. female.  The history is provided by the patient, the EMS personnel and medical records.  Abdominal Pain Kelly [name] is a 35 y.o. female who presents to the Emergency Department complaining of abdominal pain. Low five caveat due to patient being a very vague historian. She presents the emergency department by EMS for evaluation of lower abdominal pain, dysuria and difficulty walking for the last week. She has subjective fevers as well. She states that she has significant pelvic pain and feels like she can't get the urine out. She also reports low back pain. She states that she has fallen recently and has left knee pain as well. 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 She has a history of endocarditis. She is not currently on antibiotics.    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 12/05/2017   Abscess of breast    rt breast   Abscess of right breast 07/13/2014   Acute cystitis without hematuria    ADD (attention deficit disorder)    Back pain    Bacteremia due to Enterococcus 06/15/2018   Bipolar 1 disorder (HCC)    Endocarditis 2020   Foreign body granuloma of soft tissue, NEC, right upper arm    History of hepatitis C 10/01/2018   Hypertension    Infection of left wrist ([place]) 10/26/2020   Influenza B 06/17/2018   Leukocytosis    Multifocal pneumonia 06/14/2018   Polysubstance abuse ([place])    Pyelonephritis affecting pregnancy 05/24/2016   Sepsis ([place]) 12/02/2017   Suicidal overdose ([place])    Thigh abscess 10/26/2020   Vaginal discharge 10/26/2020    Patient Active Problem List   Diagnosis Date Noted   Thigh abscess 10/26/2020   Infection of left wrist ([place]) 10/26/2020   Vaginal discharge  10/26/2020   Abscess 10/26/2020   Leg abscess 10/26/2020   VBAC (vaginal birth after Cesarean) 09/30/2020   Chronic hypertension affecting pregnancy 09/29/2020   [redacted] weeks gestation of pregnancy 09/26/2020   Syphilis affecting pregnancy in third trimester 09/23/2020   C. 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   Tricuspid valve regurgitation 08/24/2020   Prolonged QT interval 08/24/2020   Pain, dental    Maternal endocarditis complicating pregnancy in third trimester    Methadone maintenance treatment affecting pregnancy ([place]) 07/21/2020   Drug use affecting pregnancy in second trimester    Septic embolism (HCC)    Syphilis    Cellulitis of right lower extremity    MRSA bacteremia 07/13/2020   Acute on chronic congestive heart failure ([place]) 07/13/2020   MDD (major depressive disorder), recurrent severe, 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   Oth psychoactive substance abuse w mood disorder ([place]) 06/26/2019   Congenital heart defect 10/07/2018   History of hepatitis C 10/01/2018   Unwanted fertility 00/86/7619   Chronic systolic heart failure ([place]) 08/29/2018   Infection due to Streptococcus mitis group 07/20/2018   History of VBAC 07/16/2018   Endocarditis 07/12/2018   Dental caries  07/03/2018   Phobia of dental procedure 07/03/2018   Preexisting hypertension complicating pregnancy, antepartum 06/29/2018   Supervision of other high risk pregnancies, third trimester 06/27/2018   Rubella non-immune status, antepartum 06/23/2018   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) 06/14/2018   Tricuspid regurgitation 06/14/2018   Difficult intravenous access    IVDU  (intravenous drug user)    Bipolar disorder ([place]) 07/31/2016   Major depressive disorder, recurrent episode with anxious distress ([place]) 07/29/2016   Polysubstance abuse ([place]) 05/25/2016   No prenatal care in current pregnancy in third trimester 05/25/2016   Chronic hypertension during pregnancy, antepartum 05/25/2016   History of cesarean delivery affecting pregnancy 05/25/2016   Breast abscess 07/13/2014   Opioid use disorder, severe, dependence ([place]) 08/25/2013   Unspecified episodic mood disorder 08/25/2013   ADHD (attention deficit hyperactivity disorder) 08/25/2013   Cocaine abuse ([place]) 08/25/2013   Thoracic back pain 01/29/2013   Scoliosis 11/17/2012   CROHN'S DISEASE 05/03/2005   COLONIC POLYPS 02/28/2003    Past Surgical History:  Procedure Laterality Date   BACK SURGERY     scoliosis-in care everywhere 08/1999   BUBBLE STUDY  11/01/2020   Procedure: BUBBLE STUDY;  Surgeon: Donato Heinz, MD;  Location: [facility] ENDOSCOPY;  Service: Cardiovascular;;   CESAREAN SECTION     CHOLECYSTECTOMY  March 2015   FOREIGN BODY REMOVAL Right 05/28/2016   Procedure: REMOVAL FOREIGN BODY RIGHT ARM;  Surgeon: Meredith Pel, MD;  Location: [facility];  Service: Orthopedics;  Laterality: Right;   I & D EXTREMITY Left 10/26/2020   Procedure: IRRIGATION AND DEBRIDEMENT LEFT WRIST;  Surgeon: Iran Planas, MD;  Location: [facility];  Service: Orthopedics;  Laterality: Left;   INCISION AND DRAINAGE ABSCESS Right 11/01/2020   Procedure: INCISION AND DRAINAGE ABSCESS RIGHT THIGH X 2;  Surgeon: Ileana Roup, MD;  Location: [facility];  Service: General;  Laterality: Right;   IRRIGATION AND DEBRIDEMENT ABSCESS Right 07/13/2014   Procedure: IRRIGATION AND DEBRIDEMEN TRIGHT BREAST ABSCESS;  Surgeon: Donnie Mesa, MD;  Location: [facility];  Service: General;  Laterality: Right;   LUMBAR LAMINECTOMY/DECOMPRESSION MICRODISCECTOMY N/A 12/05/2017   Procedure: LUMBAR LAMINECTOMY FOR EPIDURAL ABSCESS;  Surgeon:  Newman Pies, MD;  Location: [place];  Service: Neurosurgery;  Laterality: N/A;   MINOR IRRIGATION AND DEBRIDEMENT OF WOUND Left 10/26/2020   Procedure: MINOR IRRIGATION AND DEBRIDEMENT OF LEFT BREAST AND BILATERAL THIGHS;  Surgeon: Iran Planas, MD;  Location: [facility];  Service: Orthopedics;  Laterality: Left;   MULTIPLE EXTRACTIONS WITH ALVEOLOPLASTY Bilateral 07/03/2018   Procedure: Extraction of tooth #'s 30 and 31 with alveoloplasty;  Surgeon: Lenn Cal, DDS;  Location: [facility];  Service: Oral Surgery;  Laterality: Bilateral;   RADIOLOGY WITH ANESTHESIA N/A 12/05/2017   Procedure: MRI WITH ANESTHESIA OF [facility] AND LUMBAR SPINE WITH AND WITHOUT;  Surgeon: Radiologist, Medication, MD;  Location: [facility];  Service: Radiology;  Laterality: N/A;   RIGHT/LEFT HEART CATH AND CORONARY ANGIOGRAPHY N/A 05/21/2019   Procedure: RIGHT/LEFT HEART CATH AND CORONARY ANGIOGRAPHY;  Surgeon: Jolaine Artist, MD;  Location: [facility] CV LAB;  Service: Cardiovascular;  Laterality: N/A;   TEE WITHOUT CARDIOVERSION N/A 05/21/2019   Procedure: TRANSESOPHAGEAL ECHOCARDIOGRAM (TEE);  Surgeon: Jolaine Artist, MD;  Location: [facility] ENDOSCOPY;  Service: Cardiovascular;  Laterality: N/A;   TEE WITHOUT CARDIOVERSION N/A 11/01/2020   Procedure: TRANSESOPHAGEAL ECHOCARDIOGRAM (TEE);  Surgeon: Donato Heinz, MD;  Location: [facility];  Service:  Cardiovascular;  Laterality: N/A;   TUBAL LIGATION Bilateral 09/30/2020   Procedure: POST PARTUM TUBAL LIGATION;  Surgeon: Osborne Oman, MD;  Location: [facility] LD ORS;  Service: Gynecology;  Laterality: Bilateral;     OB History     Gravida  8   Para  4   Term  4   Preterm  0   AB  3   Living  4      SAB  2   IAB  1   Ectopic  0   Multiple  0   Live Births  4           Family History  Problem Relation Age of Onset   Diabetes Mother    Diabetes Father    Hypertension Father    Heart attack Neg Hx    Hyperlipidemia Neg Hx    Sudden  death Neg Hx     Social History   Tobacco Use   Smoking status: Former    Packs/day: 1.00    Years: 13.00    Pack years: 13.00    Types: Cigarettes    Quit date: 06/13/2018    Years since quitting: 2.6   Smokeless tobacco: Never  Vaping Use   Vaping Use: Never used  Substance Use Topics   Alcohol use: Not Currently    Comment: one or two drinks weekly   Drug use: Yes    Types: Cocaine, Heroin, IV, Fentanyl    Comment: heroin, crack cocaine, fentanyl last used today (07/13/20)     Home Medications Prior to Admission medications   Medication Sig Start Date End Date Taking? Authorizing Provider  furosemide (LASIX) 40 MG tablet Take 1 tablet (40 mg total) by mouth daily. 08/26/20   Aletha Halim, MD  methadone (METHADOSE) 40 MG disintegrating tablet Take 130 mg by mouth daily.    [provider]  METHADONE HCL DISKETS PO Take 130 mg by mouth in the morning.    [provider]  NIFEdipine (ADALAT CC) 30 MG 24 hr tablet Take 1 tablet (30 mg total) by mouth daily. 10/05/20   Woodroe Mode, MD  prenatal vitamin w/FE, FA (NATACHEW) 29-1 MG CHEW chewable tablet Chew 1 tablet by mouth daily at 12 noon. 11/04/20   Thurnell Lose, MD  promethazine (PHENERGAN) 25 MG tablet Take 25 mg by mouth every 8 (eight) hours as needed for nausea or vomiting.    [provider]  sertraline (ZOLOFT) 50 MG tablet Take 1 tablet (50 mg total) by mouth daily. 08/26/20   Aletha Halim, MD    Allergies    Hydrocodone and Morphine and related  Review of Systems   Review of Systems  Gastrointestinal:  Positive for abdominal pain.  All other systems reviewed and are negative.  Physical Exam Updated Vital Signs BP (!) 145/97   Pulse (!) 107   Temp 100.1 F (37.8 C)   Resp (!) 30   Ht 5' 7"  (1.702 m)   Wt 81.6 kg   SpO2 98%   BMI 28.19 kg/m   Physical Exam Vitals and nursing note reviewed.  Constitutional:      General: She is in acute distress.     Appearance:  She is well-developed. She is ill-appearing.  HENT:     Head: Normocephalic and atraumatic.  Cardiovascular:     Rate and Rhythm: Regular rhythm. Tachycardia present.  Pulmonary:     Effort: Pulmonary effort is normal. No respiratory distress.     Breath  sounds: Normal breath sounds.  Abdominal:     Palpations: Abdomen is soft.     Tenderness: There is no guarding or rebound.     Comments: Moderate lower abdominal tenderness in distention  Genitourinary:    Comments: Moderate milky white vaginal discharge.   Musculoskeletal:     Comments: 2+ DP pulses bilaterally. There is tenderness to palpation over the left knee without significant swelling or erythema. Unable to range the knee secondary to pain.  Skin:    General: Skin is warm and dry.  Neurological:     Mental Status: She is alert and oriented to person, place, and time.     Comments: Unable to lift either leg off the stretcher - patient states this is due to pain in her left knee and in her lower abdomen. 4/5 dorsiflexion/plantar flexion to bilateral lower extremities. Sensation to light touch intact in bilateral lower extremities  Psychiatric:        Behavior: Behavior normal.    ED Results / Procedures / Treatments   Labs (all labs ordered are listed, but only abnormal results are displayed) Labs Reviewed  WET PREP, GENITAL - Abnormal; Notable for the following components:      Result Value   Clue Cells Wet Prep HPF POC PRESENT (*)    WBC, Wet Prep HPF POC MANY (*)    All other components within normal limits  LACTIC ACID, PLASMA - Abnormal; Notable for the following components:   Lactic Acid, Venous 2.6 (*)    All other components within normal limits  LACTIC ACID, PLASMA - Abnormal; Notable for the following components:   Lactic Acid, Venous 2.4 (*)    All other components within normal limits  COMPREHENSIVE METABOLIC PANEL - Abnormal; Notable for the following components:   Sodium 129 (*)    Potassium 3.1 (*)     Chloride 90 (*)    Glucose, Bld 125 (*)    Calcium 8.6 (*)    Total Protein 6.2 (*)    Albumin 1.8 (*)    AST 13 (*)    Alkaline Phosphatase 195 (*)    All other components within normal limits  CBC WITH DIFFERENTIAL/PLATELET - Abnormal; Notable for the following components:   WBC 19.0 (*)    Hemoglobin 10.5 (*)    HCT 33.3 (*)    MCV 78.2 (*)    MCH 24.6 (*)    RDW 18.0 (*)    Neutro Abs 17.9 (*)    Lymphs Abs 0.4 (*)    All other components within normal limits  PROTIME-INR - Abnormal; Notable for the following components:   Prothrombin Time 15.9 (*)    INR 1.3 (*)    All other components within normal limits  URINALYSIS, ROUTINE W REFLEX MICROSCOPIC - Abnormal; Notable for the following components:   Hgb urine dipstick MODERATE (*)    All other components within normal limits  BRAIN NATRIURETIC PEPTIDE - Abnormal; Notable for the following components:   B Natriuretic Peptide 439.1 (*)    All other components within normal limits  RESP PANEL BY RT-PCR (FLU A&B, COVID) ARPGX2  URINE CULTURE  CULTURE, BLOOD (ROUTINE X 2)  CULTURE, BLOOD (ROUTINE X 2)  APTT  RPR  HIV ANTIBODY (ROUTINE TESTING W REFLEX)  I-STAT BETA HCG BLOOD, ED ([facility], [facility], [facility] ONLY)  GC/CHLAMYDIA PROBE AMP ([place]) NOT AT [facility]    EKG EKG Interpretation  Date/Time:  Thursday January 19 2021 00:19:15 EDT Ventricular Rate:  106 PR  Interval:  160 QRS Duration: 116 QT Interval:  376 QTC Calculation: 500 R Axis:   58 Text Interpretation: Sinus tachycardia Probable left atrial enlargement Incomplete right bundle branch block Confirmed by Quintella Reichert [phone]) on 01/19/2021 12:21:15 AM  Radiology CT Abdomen Pelvis W Contrast  Result Date: 01/19/2021 CLINICAL DATA:  Abdominal pain, fever, intravenous drug use, urinary retention EXAM: CT ABDOMEN AND PELVIS WITH CONTRAST TECHNIQUE: Multidetector CT imaging of the abdomen and pelvis was performed using the standard protocol following bolus administration  of intravenous contrast. CONTRAST:  62m OMNIPAQUE IOHEXOL 350 MG/ML SOLN COMPARISON:  01/15/2019 FINDINGS: Lower chest: Innumerable pulmonary nodules are seen within the visualized lung bases several of which appear cavitary and are suspicious for septic emboli in this patient. No pleural effusion. Cardiac size is mildly enlarged with particular enlargement of the right heart chambers. No pericardial effusion. Hepatobiliary: No focal liver abnormality is seen. Status post cholecystectomy. No biliary dilatation. Pancreas: Unremarkable Spleen: Unremarkable Adrenals/Urinary Tract: The adrenal glands are unremarkable. The kidneys are normal. Foley catheter balloon seen within a decompressed bladder lumen. Stomach/Bowel: Moderate stool seen within the colon. The stomach, small bowel, and large bowel are otherwise unremarkable. Appendix normal. No free intraperitoneal gas or fluid. Vascular/Lymphatic: No significant vascular findings are present. No enlarged abdominal or pelvic lymph nodes. Reproductive: Uterus and bilateral adnexa are unremarkable. Other: No abdominal wall hernia.  Rectum unremarkable. Musculoskeletal: Harrington rods are partially visualized within the thoracolumbar spine. Resection of the spinous processes of L3, L4, and L5 has been performed. Mild degenerative changes are seen within the lower lumbar spine. No acute bone abnormality. No lytic or blastic bone lesion. IMPRESSION: Innumerable pulmonary nodules within the visualized lung bases several of which demonstrate cavitary change suspicious for innumerable septic emboli. Mild cardiomegaly with stable enlargement of the right heart structures. Moderate stool seen within the colon without evidence of obstruction. Electronically Signed   By: AFidela SalisburyM.D.   On: 01/19/2021 03:47   DG Chest Port 1 View  Result Date: 01/19/2021 CLINICAL DATA:  Questionable sepsis EXAM: PORTABLE CHEST 1 VIEW COMPARISON:  10/26/2020 FINDINGS: Cardiomegaly.  Patchy bilateral airspace disease trauma left greater than right. Findings concerning for pneumonia. No effusions or acute bony abnormality. IMPRESSION: Patchy bilateral airspace opacities concerning for pneumonia. Electronically Signed   By: KRolm BaptiseM.D.   On: 01/19/2021 00:39   DG Knee Left Port  Result Date: 01/19/2021 CLINICAL DATA:  Left knee pain, fall, IV drug use EXAM: PORTABLE LEFT KNEE - 1-2 VIEW COMPARISON:  None. FINDINGS: Normal alignment. No fracture or dislocation. Joint spaces are preserved. No effusion. Prepatellar soft tissue swelling is present. IMPRESSION: Prepatellar soft tissue swelling. Electronically Signed   By: AFidela SalisburyM.D.   On: 01/19/2021 02:21    Procedures Procedures  CRITICAL CARE Performed by: EQuintella Reichert  Total critical care time: 45 minutes  Critical care time was exclusive of separately billable procedures and treating other patients.  Critical care was necessary to treat or prevent imminent or life-threatening deterioration.  Critical care was time spent personally by me on the following activities: development of treatment plan with patient and/or surrogate as well as nursing, discussions with consultants, evaluation of patient's response to treatment, examination of patient, obtaining history from patient or surrogate, ordering and performing treatments and interventions, ordering and review of laboratory studies, ordering and review of radiographic studies, pulse oximetry and re-evaluation of patient's condition.  Medications Ordered in ED Medications  fentaNYL (SUBLIMAZE) 50 MCG/ML injection (100  mcg  Given 01/19/21 0210)  iohexol (OMNIPAQUE) 350 MG/ML injection 80 mL (80 mLs Intravenous Contrast Given 01/19/21 0336)  fentaNYL (SUBLIMAZE) injection 100 mcg (100 mcg Intravenous Given 01/19/21 0445)  HYDROmorphone (DILAUDID) injection 1 mg (1 mg Intravenous Given 01/19/21 0622)  acetaminophen (TYLENOL) tablet 650 mg (650 mg Oral Given 01/19/21  0630)  LORazepam (ATIVAN) injection 1 mg (1 mg Intravenous Given 01/19/21 7915)    ED Course  I have reviewed the triage vital signs and the nursing notes.  Pertinent labs & imaging results that were available during my care of the patient were reviewed by me and considered in my medical decision making (see chart for details).    MDM Rules/Calculators/A&P                         patient with history of IV drug abuse, endocarditis here for evaluation of abdominal pain. She does have urinary retention on ED presentation with greater than 700 mL in her bladder. She also has lower extremity weakness, but is difficult to quantify due to pain in her lower extremities that limit strength testing. Also secondary to her knee pain unable to obtain patellar reflexes. Pelvic examination with white discharge but no significant findings concerning for PID or tubular ovarian abscess. CT abdomen pelvis concerning for septic pulmonary embolism. Plan to obtain MRI to evaluate for epidural abscess given her urinary retention and lower extremity weakness. She was treated with IV fluid hydration, pain control. Antibiotics were withheld due to concern for epidural abscess and potential ability to aspirate fluid to guide antibiotic treatment. Patient care transferred pending imaging.  Final Clinical Impression(s) / ED Diagnoses Final diagnoses:  None    Rx / DC Orders ED Discharge Orders     None        Quintella Reichert, MD 01/19/21 [phone]

## 2021-01-19 NOTE — Progress Notes (Signed)
PHARMACY - PHYSICIAN COMMUNICATION CRITICAL VALUE ALERT - BLOOD CULTURE IDENTIFICATION (BCID)  Kelly [name] is an 35 y.o. female who presented to [facility] on 01/19/2021 with a chief complaint of back pain, dysuria, and fevers. History of endocarditis.  Assessment:  4/4 bottles with [facility] BCID detected: Staphylococcus aureus, MecA detected  Name of physician (or Provider) Contacted: ID Team   Current antibiotics:  Vancomycin 1250 mg q12h Metronidazole 500 mg IV q12h   Changes to prescribed antibiotics recommended:  No additional antibiotics indicated at this time.   Results for orders placed or performed during the hospital encounter of 01/19/21  Blood Culture ID Panel (Reflexed) (Collected: 01/19/2021 12:30 AM)  Result Value Ref Range   Enterococcus faecalis NOT DETECTED NOT DETECTED   Enterococcus Faecium NOT DETECTED NOT DETECTED   Listeria monocytogenes NOT DETECTED NOT DETECTED   Staphylococcus species DETECTED (A) NOT DETECTED   Staphylococcus aureus (BCID) DETECTED (A) NOT DETECTED   Staphylococcus epidermidis NOT DETECTED NOT DETECTED   Staphylococcus lugdunensis NOT DETECTED NOT DETECTED   Streptococcus species NOT DETECTED NOT DETECTED   Streptococcus agalactiae NOT DETECTED NOT DETECTED   Streptococcus pneumoniae NOT DETECTED NOT DETECTED   Streptococcus pyogenes NOT DETECTED NOT DETECTED   A.calcoaceticus-baumannii NOT DETECTED NOT DETECTED   Bacteroides fragilis NOT DETECTED NOT DETECTED   Enterobacterales NOT DETECTED NOT DETECTED   Enterobacter cloacae complex NOT DETECTED NOT DETECTED   Escherichia coli NOT DETECTED NOT DETECTED   Klebsiella aerogenes NOT DETECTED NOT DETECTED   Klebsiella oxytoca NOT DETECTED NOT DETECTED   Klebsiella pneumoniae NOT DETECTED NOT DETECTED   Proteus species NOT DETECTED NOT DETECTED   Salmonella species NOT DETECTED NOT DETECTED   Serratia marcescens NOT DETECTED NOT DETECTED   Haemophilus influenzae NOT DETECTED NOT  DETECTED   Neisseria meningitidis NOT DETECTED NOT DETECTED   Pseudomonas aeruginosa NOT DETECTED NOT DETECTED   Stenotrophomonas maltophilia NOT DETECTED NOT DETECTED   Candida albicans NOT DETECTED NOT DETECTED   Candida auris NOT DETECTED NOT DETECTED   Candida glabrata NOT DETECTED NOT DETECTED   Candida krusei NOT DETECTED NOT DETECTED   Candida parapsilosis NOT DETECTED NOT DETECTED   Candida tropicalis NOT DETECTED NOT DETECTED   Cryptococcus neoformans/gattii NOT DETECTED NOT DETECTED   Meth resistant mecA/C and MREJ DETECTED (A) NOT [place], PharmD PGY2 Infectious Diseases Pharmacy Resident   Please check [email or web address] for unit-specific pharmacy phone numbers

## 2021-01-19 NOTE — Consult Note (Signed)
[name]    Date of Admission:  01/19/2021   Total days of antibiotics 1        Vancomycin and cefepime       Reason for Consult: Gram-positive bacteremia    Referring Provider: Dr. Tamala Julian  Assessment: Patient with history of IV drug use, epidural abscess and endocarditis presented to the hospital for symptoms of abdominal pain, pelvic pain and difficulty walking.  Patient was tachycardic with T-max of 100.7.  White count elevated at 19.  Lumbar and thoracic MRI did not show any signs of infection.  CT abdomens was negative for any acute intra-abdominal abnormality however it showed cavitary lesion consistent with septic emboli.  Blood culture grew MRSA.  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  Echo showed evidence of thickened tricuspid valve with severe tricuspid regurgitation.  Will obtain TEE to confirm and rule out left-sided endocarditis.  Will discontinue cefepime and continue vancomycin.   Plan: Continue vancomycin Stop cefepime TEE scheduled for Monday 4.  Pending BICD and susceptibility  Active Problems:   Sepsis ([place])   Scheduled Meds:  potassium chloride  40 mEq Oral BID   senna-docusate  1 tablet Oral BID   Continuous Infusions:  ceFEPime (MAXIPIME) IV     metronidazole     vancomycin     PRN Meds:.acetaminophen, HYDROmorphone (DILAUDID) injection  HPI: Kelly [name] is a 35 y.o. female with past medical history of polysubstance use disorder, including IV drug use, endocarditis, epidural abscess, who presented to the hospital for dysuria, abdominal pain, pelvic pain and difficulty walking.  Patient was asleep and could not wake up for interview.  She just received 1 dose of Dilaudid 1 hour ago.  Does not appear in much discomfort.   Review of Systems: ROS could not obtain ROS  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-L5 lumbar  spine 12/05/2017   Abscess of breast    rt breast   Abscess of right breast 07/13/2014   Acute cystitis without hematuria    ADD (attention deficit disorder)    Back pain    Bacteremia due to Enterococcus 06/15/2018   Bipolar 1 disorder (HCC)    Endocarditis 2020   Foreign body granuloma of soft tissue, NEC, right upper arm    History of hepatitis C 10/01/2018   Hypertension    Infection of left wrist ([place]) 10/26/2020   Influenza B 06/17/2018   Leukocytosis    Multifocal pneumonia 06/14/2018   Polysubstance abuse ([place])    Pyelonephritis affecting pregnancy 05/24/2016   Sepsis ([place]) 12/02/2017   Suicidal overdose ([place])    Thigh abscess 10/26/2020   Vaginal discharge 10/26/2020    Social History   Tobacco Use   Smoking status: Former    Packs/day: 1.00    Years: 13.00    Pack years: 13.00    Types: Cigarettes    Quit date: 06/13/2018    Years since quitting: 2.6   Smokeless tobacco: Never  Vaping Use   Vaping Use: Never used  Substance Use Topics   Alcohol use: Not Currently    Comment: one or two drinks weekly   Drug use: Yes    Types: Cocaine, Heroin, IV, Fentanyl    Comment: heroin, crack cocaine, fentanyl last used today (07/13/20)     Family History  Problem Relation Age of Onset   Diabetes  Mother    Diabetes Father    Hypertension Father    Heart attack Neg Hx    Hyperlipidemia Neg Hx    Sudden death Neg Hx    Allergies  Allergen Reactions   Hydrocodone Itching   Morphine And Related Nausea And Vomiting    OBJECTIVE: Blood pressure (!) 128/99, pulse (!) 107, temperature (!) 100.5 F (38.1 C), resp. rate (!) 29, height 5' 7"  (1.702 m), weight 81.6 kg, SpO2 100 %, unknown if currently breastfeeding.  Physical Exam Constitutional:      General: She is not in acute distress.    Comments: Asleep  HENT:     Head: Normocephalic.  Cardiovascular:     Rate and Rhythm: Regular rhythm. Tachycardia present.  Pulmonary:     Effort: Pulmonary effort is normal. No  respiratory distress.     Comments: Could not auscultate posterior lung Abdominal:     General: Bowel sounds are normal. There is no distension.     Palpations: Abdomen is soft.     Comments: No change in facial expression with abdominal palpation  Skin:    General: Skin is warm.     Coloration: Skin is not jaundiced.     Comments: Track marks noted of upper extremity    Lab Results Lab Results  Component Value Date   WBC 19.0 (H) 01/19/2021   HGB 10.5 (L) 01/19/2021   HCT 33.3 (L) 01/19/2021   MCV 78.2 (L) 01/19/2021   PLT 272 01/19/2021    Lab Results  Component Value Date   CREATININE 0.61 01/19/2021   BUN 9 01/19/2021   NA 129 (L) 01/19/2021   K 3.1 (L) 01/19/2021   CL 90 (L) 01/19/2021   CO2 27 01/19/2021    Lab Results  Component Value Date   ALT 9 01/19/2021   AST 13 (L) 01/19/2021   ALKPHOS 195 (H) 01/19/2021   BILITOT 0.6 01/19/2021     Microbiology: Recent Results (from the past 240 hour(s))  Resp Panel by RT-PCR (Flu A&B, Covid) Nasopharyngeal Swab     Status: None   Collection Time: 01/19/21 12:21 AM   Specimen: Nasopharyngeal Swab; Nasopharyngeal(NP) swabs in vial transport medium  Result Value Ref Range Status   SARS Coronavirus 2 by RT PCR NEGATIVE NEGATIVE Final    Comment: (NOTE) SARS-CoV-2 target nucleic acids are NOT DETECTED.  The SARS-CoV-2 RNA is generally detectable in upper respiratory specimens during the acute phase of infection. The lowest concentration of SARS-CoV-2 viral copies this assay can detect is 138 copies/mL. A negative result does not preclude SARS-Cov-2 infection and should not be used as the sole basis for treatment or other patient management decisions. A negative result may occur with  improper specimen collection/handling, submission of specimen other than nasopharyngeal swab, presence of viral mutation(s) within the areas targeted by this assay, and inadequate number of viral copies(<138 copies/mL). A negative result  must be combined with clinical observations, patient history, and epidemiological information. The expected result is Negative.  Fact Sheet for Patients:  EntrepreneurPulse.com.au  Fact Sheet for Healthcare Providers:  IncredibleEmployment.be  This test is no t yet approved or cleared by the [place] FDA and  has been authorized for detection and/or diagnosis of SARS-CoV-2 by FDA under an Emergency Use Authorization (EUA). This EUA will remain  in effect (meaning this test can be used) for the duration of the COVID-19 declaration under Section 564(b)(1) of the Act, 21 U.S.C.section 360bbb-3(b)(1), unless the authorization is terminated  or  revoked sooner.       Influenza A by PCR NEGATIVE NEGATIVE Final   Influenza B by PCR NEGATIVE NEGATIVE Final    Comment: (NOTE) The Xpert Xpress SARS-CoV-2/FLU/RSV plus assay is intended as an aid in the diagnosis of influenza from Nasopharyngeal swab specimens and should not be used as a sole basis for treatment. Nasal washings and aspirates are unacceptable for Xpert Xpress SARS-CoV-2/FLU/RSV testing.  Fact Sheet for Patients: EntrepreneurPulse.com.au  Fact Sheet for Healthcare Providers: IncredibleEmployment.be  This test is not yet approved or cleared by the [place] FDA and has been authorized for detection and/or diagnosis of SARS-CoV-2 by FDA under an Emergency Use Authorization (EUA). This EUA will remain in effect (meaning this test can be used) for the duration of the COVID-19 declaration under Section 564(b)(1) of the Act, 21 U.S.C. section 360bbb-3(b)(1), unless the authorization is terminated or revoked.  Performed at [facility], [place] [address] [place], [place] [address]   Blood Culture (routine x 2)     Status: None (Preliminary result)   Collection Time: 01/19/21 12:30 AM   Specimen: BLOOD RIGHT HAND  Result Value Ref Range  Status   Specimen Description BLOOD RIGHT HAND  Final   Special Requests   Final    BOTTLES DRAWN AEROBIC AND ANAEROBIC Blood Culture results may not be optimal due to an inadequate volume of blood received in culture bottles   Culture  Setup Time   Final    GRAM POSITIVE COCCI IN CLUSTERS IN BOTH AEROBIC AND ANAEROBIC BOTTLES Organism ID to follow Performed at [facility], [place] [address], [place], [place] [address]    Culture GRAM POSITIVE COCCI  Final   Report Status PENDING  Incomplete  Blood Culture (routine x 2)     Status: None (Preliminary result)   Collection Time: 01/19/21 12:34 AM   Specimen: BLOOD LEFT WRIST  Result Value Ref Range Status   Specimen Description BLOOD LEFT WRIST  Final   Special Requests   Final    BOTTLES DRAWN AEROBIC AND ANAEROBIC Blood Culture results may not be optimal due to an inadequate volume of blood received in culture bottles   Culture  Setup Time   Final    GRAM POSITIVE COCCI IN BOTH AEROBIC AND ANAEROBIC BOTTLES Performed at [facility], [place] [address] [place], [place] [address]    Culture GRAM POSITIVE COCCI  Final   Report Status PENDING  Incomplete  Wet prep, genital     Status: Abnormal   Collection Time: 01/19/21  5:20 AM   Specimen: PATH Cytology Cervicovaginal Ancillary Only  Result Value Ref Range Status   Yeast Wet Prep HPF POC NONE SEEN NONE SEEN Final   Trich, Wet Prep NONE SEEN NONE SEEN Final   Clue Cells Wet Prep HPF POC PRESENT (A) NONE SEEN Final   WBC, Wet Prep HPF POC MANY (A) NONE SEEN Final   Sperm NONE SEEN  Final    Comment: Performed at [facility], [place] [address] [place], [place] [address]    Gaylan Gerold, [facility] [phone] pager   01/19/2021, 1:31 PM

## 2021-01-19 NOTE — ED Notes (Signed)
This RN gave report to April RN on 5W- April RN notified this RN that pt is going to room 4 and that room is not clean at the moment

## 2021-01-20 DIAGNOSIS — R7881 Bacteremia: Secondary | ICD-10-CM | POA: Diagnosis not present

## 2021-01-20 DIAGNOSIS — R6520 Severe sepsis without septic shock: Secondary | ICD-10-CM | POA: Diagnosis not present

## 2021-01-20 DIAGNOSIS — F1990 Other psychoactive substance use, unspecified, uncomplicated: Secondary | ICD-10-CM | POA: Diagnosis not present

## 2021-01-20 DIAGNOSIS — A419 Sepsis, unspecified organism: Secondary | ICD-10-CM | POA: Diagnosis not present

## 2021-01-20 LAB — RAPID URINE DRUG SCREEN, HOSP PERFORMED
Amphetamines: POSITIVE — AB
Barbiturates: NOT DETECTED
Benzodiazepines: POSITIVE — AB
Cocaine: POSITIVE — AB
Opiates: POSITIVE — AB
Tetrahydrocannabinol: NOT DETECTED

## 2021-01-20 LAB — BASIC METABOLIC PANEL
Anion gap: 11 (ref 5–15)
BUN: 15 mg/dL (ref 6–20)
CO2: 26 mmol/L (ref 22–32)
Calcium: 8.3 mg/dL — ABNORMAL LOW (ref 8.9–10.3)
Chloride: 93 mmol/L — ABNORMAL LOW (ref 98–111)
Creatinine, Ser: 0.71 mg/dL (ref 0.44–1.00)
GFR, Estimated: >60 mL/min (ref >60)
Glucose, Bld: 123 mg/dL — ABNORMAL HIGH (ref 70–99)
Potassium: 4.2 mmol/L (ref 3.5–5.1)
Sodium: 130 mmol/L — ABNORMAL LOW (ref 135–145)

## 2021-01-20 LAB — CBC
HCT: 32.1 % — ABNORMAL LOW (ref 36.0–46.0)
Hemoglobin: 9.9 g/dL — ABNORMAL LOW (ref 12.0–15.0)
MCH: 24.3 pg — ABNORMAL LOW (ref 26.0–34.0)
MCHC: 30.8 g/dL (ref 30.0–36.0)
MCV: 78.9 fL — ABNORMAL LOW (ref 80.0–100.0)
Platelets: 231 10*3/uL (ref 150–400)
RBC: 4.07 MIL/uL (ref 3.87–5.11)
RDW: 18.4 % — ABNORMAL HIGH (ref 11.5–15.5)
WBC: 19.2 10*3/uL — ABNORMAL HIGH (ref 4.0–10.5)
nRBC: 0 % (ref 0.0–0.2)

## 2021-01-20 LAB — MAGNESIUM: Magnesium: 1.6 mg/dL — ABNORMAL LOW (ref 1.7–2.4)

## 2021-01-20 LAB — T.PALLIDUM AB, TOTAL: T Pallidum Abs: REACTIVE — AB

## 2021-01-20 LAB — LACTIC ACID, PLASMA: Lactic Acid, Venous: 1.2 mmol/L (ref 0.5–1.9)

## 2021-01-20 LAB — PROCALCITONIN: Procalcitonin: 0.72 ng/mL

## 2021-01-20 MED ORDER — METHADONE HCL 10 MG PO TABS
30.0000 mg | ORAL_TABLET | Freq: Every day | ORAL | Status: DC
  Administered 2021-01-20: 30 mg via ORAL
  Filled 2021-01-20: qty 3

## 2021-01-20 MED ORDER — HYDROMORPHONE HCL 1 MG/ML IJ SOLN
1.0000 mg | INTRAMUSCULAR | Status: DC | PRN
  Administered 2021-01-20 – 2021-01-25 (×34): 1 mg via INTRAVENOUS
  Filled 2021-01-20 (×34): qty 1

## 2021-01-20 MED ORDER — MAGNESIUM SULFATE 4 GM/100ML IV SOLN
4.0000 g | Freq: Once | INTRAVENOUS | Status: AC
  Administered 2021-01-20: 4 g via INTRAVENOUS
  Filled 2021-01-20: qty 100

## 2021-01-20 MED ORDER — VANCOMYCIN HCL 1250 MG/250ML IV SOLN
1250.0000 mg | Freq: Two times a day (BID) | INTRAVENOUS | Status: DC
  Administered 2021-01-20 – 2021-01-25 (×11): 1250 mg via INTRAVENOUS
  Filled 2021-01-20 (×11): qty 250

## 2021-01-20 MED ORDER — LIDOCAINE 4 % EX CREA
TOPICAL_CREAM | Freq: Two times a day (BID) | CUTANEOUS | Status: AC | PRN
Start: 2021-01-20 — End: 2021-01-21
  Administered 2021-01-20 – 2021-01-21 (×2): 1 via TOPICAL
  Filled 2021-01-20: qty 5

## 2021-01-20 MED ORDER — METRONIDAZOLE 500 MG PO TABS
500.0000 mg | ORAL_TABLET | Freq: Two times a day (BID) | ORAL | Status: AC
  Administered 2021-01-20 – 2021-01-25 (×12): 500 mg via ORAL
  Filled 2021-01-20 (×12): qty 1

## 2021-01-20 MED ORDER — POLYETHYLENE GLYCOL 3350 17 G PO PACK
17.0000 g | PACK | Freq: Every day | ORAL | Status: DC
  Administered 2021-01-21: 17 g via ORAL
  Filled 2021-01-20: qty 1

## 2021-01-20 MED ORDER — KETOROLAC TROMETHAMINE 15 MG/ML IJ SOLN
15.0000 mg | Freq: Once | INTRAMUSCULAR | Status: AC
  Administered 2021-01-20: 15 mg via INTRAVENOUS
  Filled 2021-01-20: qty 1

## 2021-01-20 NOTE — TOC Initial Note (Signed)
Transition of Care [facility]) - Initial/Assessment Note    Patient Details  Name: Kelly [name] MRN: [id number] Date of Birth: Oct 03, 1985  [facility] CM/SW Contact:    Verdell Carmine, RN Phone Number: 01/20/2021, 5:05 PM  Clinical Narrative:                 One of many admissions for this 35 year old patient with a history of HPTN, Pulm hypertension and illicit drug polysubstance abuse. Has had problems in the past with endocarditis, abbcess treated with IV and PO antibiotics. TEE will done to assess valves, questionable septic emboli, and has patchy infiltrates on CXRY. CSW has done SA counseling in the past. Will need cage screen when more aware, currently lethargic and having abdominal pain. CM and CSW to follow for needs. She is not a candidate for Outaptient IV antibiotics, as she has udrug use admitted in past year. , as well as her drug screen was positive for Amphetamines, [place] opiate and Cocaine, which she admitted some use. She is receiving pain medication.   She is not a candidate for medication assistance as she does have insurance ( Medicaid) but may not be enough for coverage such as home health etc.  CM and CSW will follow for needs , recommendations and transitions.   Expected Discharge Plan: Home/Self Care Barriers to Discharge: Continued Medical Work up, Inadequate or no insurance   Patient Goals and CMS Choice        Expected Discharge Plan and Services Expected Discharge Plan: Home/Self Care In-house Referral: Clinical Social Work     Living arrangements for the past 2 months:  (Has PO box listed Mother as contact)                                      Prior Living Arrangements/Services Living arrangements for the past 2 months:  (Has PO box listed Mother as contact)   Patient language and need for interpreter reviewed:: Yes        Need for Family Participation in Patient Care: Yes (Comment)     Criminal Activity/Legal Involvement  Pertinent to Current Situation/Hospitalization: No - Comment as needed  Activities of Daily Living      Permission Sought/Granted                  Emotional Assessment   Attitude/Demeanor/Rapport: Lethargic     Alcohol / Substance Use: Illicit Drugs Psych Involvement: No (comment)  Admission diagnosis:  Bacteremia [R78.81] Sepsis ([place]) [A41.9] Patient Active Problem List   Diagnosis Date Noted   Sepsis ([place]) 01/19/2021   Constipation 01/19/2021   Thigh abscess 10/26/2020   Infection of left wrist ([place]) 10/26/2020   Vaginal discharge 10/26/2020   Abscess 10/26/2020   Leg abscess 10/26/2020   VBAC (vaginal birth after Cesarean) 09/30/2020   Chronic hypertension affecting pregnancy 09/29/2020   [redacted] weeks gestation of pregnancy 09/26/2020   Syphilis affecting pregnancy in third trimester 09/23/2020   C. 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   Severe tricuspid regurgitation 08/24/2020   Prolonged QT interval 08/24/2020   Pain, dental    Maternal endocarditis complicating pregnancy in third trimester    Methadone maintenance  treatment affecting pregnancy ([place]) 07/21/2020   Drug use affecting pregnancy in second trimester    Septic embolism ([place])    Syphilis    Cellulitis of right lower extremity    MRSA bacteremia 07/13/2020   Acute on chronic congestive heart failure ([place]) 07/13/2020   MDD (major depressive disorder), recurrent severe, 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   Oth psychoactive substance abuse w mood disorder ([place]) 06/26/2019   Congenital heart defect 10/07/2018   History of hepatitis C 10/01/2018   Unwanted fertility 50/93/2671   Chronic systolic heart failure ([place]) 08/29/2018   Infection due to Streptococcus mitis  group 07/20/2018   History of VBAC 07/16/2018   Endocarditis 07/12/2018   Dental caries 07/03/2018   Phobia of dental procedure 07/03/2018   Preexisting hypertension complicating pregnancy, antepartum 06/29/2018   Supervision of other high risk pregnancies, third trimester 06/27/2018   Rubella non-immune status, antepartum 06/23/2018   Hyponatremia 06/14/2018   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) 06/14/2018   Tricuspid regurgitation 06/14/2018   Difficult intravenous access    IVDU (intravenous drug user)    Bipolar disorder ([place]) 07/31/2016   Major depressive disorder, recurrent episode with anxious distress ([place]) 07/29/2016   Polysubstance abuse ([place]) 05/25/2016   No prenatal care in current pregnancy in third trimester 05/25/2016   Chronic hypertension during pregnancy, antepartum 05/25/2016   History of cesarean delivery affecting pregnancy 05/25/2016   Breast abscess 07/13/2014   Opioid use disorder, severe, dependence ([place]) 08/25/2013   Unspecified episodic mood disorder 08/25/2013   ADHD (attention deficit hyperactivity disorder) 08/25/2013   Cocaine abuse ([place]) 08/25/2013   Back pain 01/29/2013   Scoliosis 11/17/2012   CROHN'S DISEASE 05/03/2005   COLONIC POLYPS 02/28/2003   PCP:  Pcp, No Pharmacy:   [facility] [place], [place] [address] [place] [address] Phone: [phone] Fax: [phone]     Social Determinants of Health (SDOH) Interventions    Readmission Risk Interventions Readmission Risk Prevention Plan 08/25/2020 07/30/2018  Transportation Screening Complete Complete  Transportation Screening Comment Rooms at the [facility] to provide transportation for the patient. -  PCP or Specialist Appt within 5-7 Days Complete -  Not Complete comments 08/29/20 OB/GYN appointment scheduled -   Home Care Screening Complete -  Medication Review (RN Care Manager) - Complete  PCP or Specialist appointment within 3-5 days of discharge - Complete  HRI or [place] - Not Complete  HRI or Home Care Consult Pt Refusal Comments - N/A  SW Recovery Care/Counseling Consult - Complete  Palliative Care Screening - Not Complete  Comments - N/A  Skilled Nursing Facility - Not Complete  SNF Comments - N/A  Some recent data might be hidden

## 2021-01-20 NOTE — Progress Notes (Signed)
   01/20/21 1132  Assess: MEWS Score  Temp 99.1 F (37.3 C)  BP (!) 132/96  Pulse Rate (!) 109  Resp (!) 28  Level of Consciousness Alert  SpO2 95 %  O2 Device Nasal Cannula  O2 Flow Rate (L/min) 3 L/min  Assess: MEWS Score  MEWS Temp 0  MEWS Systolic 0  MEWS Pulse 1  MEWS RR 2  MEWS LOC 0  MEWS Score 3  MEWS Score Color Yellow  Assess: if the MEWS score is Yellow or Red  Were vital signs taken at a resting state? Yes  Focused Assessment No change from prior assessment  Early Detection of Sepsis Score *See Row Information* High  MEWS guidelines implemented *See Row Information* Yes  Take Vital Signs  Increase Vital Sign Frequency  Yellow: Q 2hr X 2 then Q 4hr X 2, if remains yellow, continue Q 4hrs  Escalate  MEWS: Escalate Yellow: discuss with charge nurse/RN and consider discussing with provider and RRT  Notify: Charge Nurse/RN  Name of Charge Nurse/RN Notified Erin RN  Date Charge Nurse/RN Notified 01/20/21  Time Charge Nurse/RN Notified 1242  Document  Progress note created (see row info) Yes

## 2021-01-20 NOTE — Progress Notes (Signed)
PROGRESS NOTE    Kelly [name]  AUQ:[id number] DOB: 02-11-1986 DOA: 01/19/2021 PCP: Pcp, No   Chief Complaint  Patient presents with   Abdominal Pain    Brief Narrative:   HPI: Kelly [name] is a 35 y.o. female with medical history significant of HTN, systolic CHF with moderate pulmonary hypertension and severe TR, polysubstance abuse including history of IV drug use, recurrent history of endocarditis, C. difficile colitis complaints of 3 to 4-day history of pelvic pain.  History is difficult to obtain from the patient due to lethargy.  She describes the abdominal pain as "horrible" and urgency with inability to urinate.  Notes associated symptoms of subjective fever, lower back pain, falls due to difficulty walking, vomiting, constipation, and left knee pain.  Patient states that she has been injecting IV heroin in her arms, legs, and wherever she could.  Also endorses smoking cocaine intermittently, but unable to tell last use.   Records note patient was last hospitalized from 6/15-6/24 for multiple abscesses status post I&D with cultures positive for staph aureus treated on IV vancomycin and Unasyn.  Blood cultures were noted to be negative.  Repeat TEE noted possible healed tricuspid vegetation with positive bubble study suggesting PFO with interatrial shunt.  Patient ultimately was discharged to complete course of Zyvox and amoxicillin.  Unclear if patient actually did this.   In route with EMS patient was noted to be hypotensive with systolic blood pressures in the 80s palpated with tachycardia into the 140s.  She was given 300-400 cc of IV fluids with improvement in tachycardia and down to 106.   ED Course: Upon admission to the emergency department patient was seen to be febrile up to 100.7 F with tachycardia and tachypnea.  Patient was placed on 2 L of nasal cannula oxygen, but was not documented to be hypoxic.  Labs significant for WBC 19, globin 10.5, sodium 129, potassium 3.1,  alkaline phosphatase 195, albumin 1.8, BNP 439.1, INR 2.4, and INR 1.3.  COVID-19 screening was negative.  Wet prep was obtained showing presence of clue cells and many WBCs.  Urinalysis significant for blood present, but no significant signs of infection.  Bladder scan noted 798 mL of urine present for which a Foley catheter was placed.  Chest x-ray showed patchy bilateral airspace opacities concerning for pneumonia.  Code sepsis was initiated, blood cultures obtained, and patient was given lactated ringer fluid bolus of 500 mL then placed on rate of 150 mL/h.  Patient had been given acetaminophen pain, multiple pain medications, vancomycin, cefepime, and metronidazole.  CT scan of the abdomen noted innumerable pulmonary nodules within the lung bases which demonstrated cavitary changes suspicious for innumerable septic emboli mild cardiomegaly, and moderate stool burden without obstruction.  Due to the urinary retention MRI of the spine was obtained but did not show any signs of epidural abscess echocardiogram has been ordered and pending.   Assessment & Plan:   Principal Problem:   Sepsis ([place]) Active Problems:   Back pain   Polysubstance abuse ([place])   IVDU (intravenous drug user)   Hyponatremia   Endocarditis of tricuspid valve   Severe tricuspid regurgitation   Prolonged QT interval   Constipation   Sepsis secondary MRSA bacteremia -Sepsis secondary to MRSA bacteremia, with possible tricuspid valve endocarditis secondary to IV drug use  -T-max 102.6 yesterday - ID input greatly appreciated, continue with IV vancomycin for MRSA bacteremia,  - continue with p.o. Flagyl for BV  -Plan for TEE on Monday.  Acute metabolic encephalopathy:  -Currently resolved, most likely due to polysubstance abuse, mentation back to baseline.  Chronic systolic CHF severe tricuspid regurgitation pulmonary hypertension: -   Echocardiogram today noted EF to be 40 to 45% with pulmonary artery hypertension  with RVSP 68.6, severe tricuspid regurgitation, and thickened tricuspid valve leaflets.  Heart function appears similar to last study done in June and at that time bubble study noted concern for interatrial shunt. -Strict I&Os and daily weights -Continue to monitor and evaluate for need of diuresis   Bacterial vaginosis: -  on PO flagyl   Urinary retention:  -Continue Foley catheter, voiding trial when more ambulatory    Hyponatremia: -Monitor closely   Prolonged QT interval: Acute.   QTc 500, improved, this morning it is 470.  Polysubstance abuse IV drug user: Patient reports recent use of IV heroin as well as intermittent use of cocaine. -Will need to continue to counsel on need of cessation of substance use. -We will start on methadone -Urine drug screen positive for cocaine, opiates, benzodiazepines and amphetamines     DVT prophylaxis: Lovenox Code Status: Full Family Communication: None at bedside Disposition:   Status is: Inpatient  Remains inpatient appropriate because:IV treatments appropriate due to intensity of illness or inability to take PO  Dispo: The patient is from: Home              Anticipated d/c is to: Home              Patient currently is not medically stable to d/c.   Difficult to place patient Yes       Consultants:  ID   Subjective:  Patient with fever 102.7 over last 24 hours, she reports she is feeling sick, and hurting all over, Objective: Vitals:   01/20/21 0805 01/20/21 1055 01/20/21 1132 01/20/21 1338  BP: (!) 138/94  (!) 132/96 (!) 117/101  Pulse: (!) 103  (!) 109 (!) 106  Resp: 20  (!) 28 (!) 33  Temp: (!) 97 F (36.1 C) 99.1 F (37.3 C) 99.1 F (37.3 C) 97.8 F (36.6 C)  TempSrc: Oral Oral Oral Oral  SpO2: 95%  95% 97%  Weight:      Height:        Intake/Output Summary (Last 24 hours) at 01/20/2021 1344 Last data filed at 01/20/2021 0400 Gross per 24 hour  Intake 750.19 ml  Output 550 ml  Net 200.19 ml   Filed  Weights   01/19/21 0014  Weight: 81.6 kg    Examination:  Awake Alert, Oriented X 3, frail, chronically ill-appearing, uncomfortable. Symmetrical Chest wall movement, Good air movement bilaterally, CTAB RRR,No Gallops,Rubs ,+ Murmurs, No Parasternal Heave +ve B.Sounds, Abd Soft, No tenderness, No rebound - guarding or rigidity. No Cyanosis, Clubbing or edema, No new Rash or bruise     Patient was seen with the presence of her nurse at bedside   Data Reviewed: I have personally reviewed following labs and imaging studies  CBC: Recent Labs  Lab 01/19/21 0030 01/20/21 1132  WBC 19.0* 19.2*  NEUTROABS 17.9*  --   HGB 10.5* 9.9*  HCT 33.3* 32.1*  MCV 78.2* 78.9*  PLT 272 962    Basic Metabolic Panel: Recent Labs  Lab 01/19/21 0030 01/20/21 1132  NA 129* 130*  K 3.1* 4.2  CL 90* 93*  CO2 27 26  GLUCOSE 125* 123*  BUN 9 15  CREATININE 0.61 0.71  CALCIUM 8.6* 8.3*  MG  --  1.6*  GFR: Estimated Creatinine Clearance: 106.8 mL/min (by C-G formula based on SCr of 0.71 mg/dL).  Liver Function Tests: Recent Labs  Lab 01/19/21 0030  AST 13*  ALT 9  ALKPHOS 195*  BILITOT 0.6  PROT 6.2*  ALBUMIN 1.8*    CBG: No results for input(s): GLUCAP in the last 168 hours.   Recent Results (from the past 240 hour(s))  Urine Culture     Status: Abnormal (Preliminary result)   Collection Time: 01/19/21 12:15 AM   Specimen: In/Out Cath Urine  Result Value Ref Range Status   Specimen Description IN/OUT CATH URINE  Final   Special Requests   Final    NONE Performed at [facility], [address], [place], [place] [address]    Culture 50,000 COLONIES/mL STAPHYLOCOCCUS AUREUS (A)  Final   Report Status PENDING  Incomplete  Resp Panel by RT-PCR (Flu A&B, Covid) Nasopharyngeal Swab     Status: None   Collection Time: 01/19/21 12:21 AM   Specimen: Nasopharyngeal Swab; Nasopharyngeal(NP) swabs in vial transport medium  Result Value Ref Range Status   SARS  Coronavirus 2 by RT PCR NEGATIVE NEGATIVE Final    Comment: (NOTE) SARS-CoV-2 target nucleic acids are NOT DETECTED.  The SARS-CoV-2 RNA is generally detectable in upper respiratory specimens during the acute phase of infection. The lowest concentration of SARS-CoV-2 viral copies this assay can detect is 138 copies/mL. A negative result does not preclude SARS-Cov-2 infection and should not be used as the sole basis for treatment or other patient management decisions. A negative result may occur with  improper specimen collection/handling, submission of specimen other than nasopharyngeal swab, presence of viral mutation(s) within the areas targeted by this assay, and inadequate number of viral copies(<138 copies/mL). A negative result must be combined with clinical observations, patient history, and epidemiological information. The expected result is Negative.  Fact Sheet for Patients:  EntrepreneurPulse.com.au  Fact Sheet for Healthcare Providers:  IncredibleEmployment.be  This test is no t yet approved or cleared by the [place] FDA and  has been authorized for detection and/or diagnosis of SARS-CoV-2 by FDA under an Emergency Use Authorization (EUA). This EUA will remain  in effect (meaning this test can be used) for the duration of the COVID-19 declaration under Section 564(b)(1) of the Act, 21 U.S.C.section 360bbb-3(b)(1), unless the authorization is terminated  or revoked sooner.       Influenza A by PCR NEGATIVE NEGATIVE Final   Influenza B by PCR NEGATIVE NEGATIVE Final    Comment: (NOTE) The Xpert Xpress SARS-CoV-2/FLU/RSV plus assay is intended as an aid in the diagnosis of influenza from Nasopharyngeal swab specimens and should not be used as a sole basis for treatment. Nasal washings and aspirates are unacceptable for Xpert Xpress SARS-CoV-2/FLU/RSV testing.  Fact Sheet for  Patients: EntrepreneurPulse.com.au  Fact Sheet for Healthcare Providers: IncredibleEmployment.be  This test is not yet approved or cleared by the [place] FDA and has been authorized for detection and/or diagnosis of SARS-CoV-2 by FDA under an Emergency Use Authorization (EUA). This EUA will remain in effect (meaning this test can be used) for the duration of the COVID-19 declaration under Section 564(b)(1) of the Act, 21 U.S.C. section 360bbb-3(b)(1), unless the authorization is terminated or revoked.  Performed at [facility], [place] [address], [place], [place] [address]   Blood Culture (routine x 2)     Status: Abnormal (Preliminary result)   Collection Time: 01/19/21 12:30 AM   Specimen: BLOOD RIGHT HAND  Result Value Ref Range Status   Specimen Description BLOOD RIGHT HAND  Final   Special Requests   Final    BOTTLES DRAWN AEROBIC AND ANAEROBIC Blood Culture results may not be optimal due to an inadequate volume of blood received in culture bottles   Culture  Setup Time   Final    GRAM POSITIVE COCCI IN CLUSTERS IN BOTH AEROBIC AND ANAEROBIC BOTTLES CRITICAL RESULT CALLED TO, READ BACK BY AND VERIFIED WITH: PHARMD ALEX L 1332 [id number] FCP    Culture (A)  Final    STAPHYLOCOCCUS AUREUS SUSCEPTIBILITIES TO FOLLOW Performed at [facility], [place] [address], [place], [place] [address]    Report Status PENDING  Incomplete  Blood Culture ID Panel (Reflexed)     Status: Abnormal   Collection Time: 01/19/21 12:30 AM  Result Value Ref Range Status   Enterococcus faecalis NOT DETECTED NOT DETECTED Final   Enterococcus Faecium NOT DETECTED NOT DETECTED Final   Listeria monocytogenes NOT DETECTED NOT DETECTED Final   Staphylococcus species DETECTED (A) NOT DETECTED Final    Comment: CRITICAL RESULT CALLED TO, READ BACK BY AND VERIFIED WITH: PHARMD ALEX L 1332 [id number] FCP    Staphylococcus aureus (BCID) DETECTED (A) NOT DETECTED  Final    Comment: Methicillin (oxacillin)-resistant Staphylococcus aureus (MRSA). MRSA is predictably resistant to beta-lactam antibiotics (except ceftaroline). Preferred therapy is vancomycin unless clinically contraindicated. Patient requires contact precautions if  hospitalized. CRITICAL RESULT CALLED TO, READ BACK BY AND VERIFIED WITH: PHARMD ALEX L 1332 [id number] FCP    Staphylococcus epidermidis NOT DETECTED NOT DETECTED Final   Staphylococcus lugdunensis NOT DETECTED NOT DETECTED Final   Streptococcus species NOT DETECTED NOT DETECTED Final   Streptococcus agalactiae NOT DETECTED NOT DETECTED Final   Streptococcus pneumoniae NOT DETECTED NOT DETECTED Final   Streptococcus pyogenes NOT DETECTED NOT DETECTED Final   A.calcoaceticus-baumannii NOT DETECTED NOT DETECTED Final   Bacteroides fragilis NOT DETECTED NOT DETECTED Final   Enterobacterales NOT DETECTED NOT DETECTED Final   Enterobacter cloacae complex NOT DETECTED NOT DETECTED Final   Escherichia coli NOT DETECTED NOT DETECTED Final   Klebsiella aerogenes NOT DETECTED NOT DETECTED Final   Klebsiella oxytoca NOT DETECTED NOT DETECTED Final   Klebsiella pneumoniae NOT DETECTED NOT DETECTED Final   Proteus species NOT DETECTED NOT DETECTED Final   Salmonella species NOT DETECTED NOT DETECTED Final   Serratia marcescens NOT DETECTED NOT DETECTED Final   Haemophilus influenzae NOT DETECTED NOT DETECTED Final   Neisseria meningitidis NOT DETECTED NOT DETECTED Final   Pseudomonas aeruginosa NOT DETECTED NOT DETECTED Final   Stenotrophomonas maltophilia NOT DETECTED NOT DETECTED Final   Candida albicans NOT DETECTED NOT DETECTED Final   Candida auris NOT DETECTED NOT DETECTED Final   Candida glabrata NOT DETECTED NOT DETECTED Final   Candida krusei NOT DETECTED NOT DETECTED Final   Candida parapsilosis NOT DETECTED NOT DETECTED Final   Candida tropicalis NOT DETECTED NOT DETECTED Final   Cryptococcus neoformans/gattii NOT DETECTED  NOT DETECTED Final   Meth resistant mecA/C and MREJ DETECTED (A) NOT DETECTED Final    Comment: CRITICAL RESULT CALLED TO, READ BACK BY AND VERIFIED WITH: PHARMD ALEX L 1332 [id number] FCP Performed at [name], [address], [place], [place] [address]   Blood Culture (routine x 2)     Status: Abnormal (Preliminary result)   Collection Time: 01/19/21 12:34 AM   Specimen: BLOOD LEFT WRIST  Result Value Ref Range Status   Specimen Description BLOOD  LEFT WRIST  Final   Special Requests   Final    BOTTLES DRAWN AEROBIC AND ANAEROBIC Blood Culture results may not be optimal due to an inadequate volume of blood received in culture bottles   Culture  Setup Time   Final    GRAM POSITIVE COCCI IN BOTH AEROBIC AND ANAEROBIC BOTTLES Performed at [facility], [place] [address], [place], [place] [address]    Culture STAPHYLOCOCCUS AUREUS (A)  Final   Report Status PENDING  Incomplete  Wet prep, genital     Status: Abnormal   Collection Time: 01/19/21  5:20 AM   Specimen: PATH Cytology Cervicovaginal Ancillary Only  Result Value Ref Range Status   Yeast Wet Prep HPF POC NONE SEEN NONE SEEN Final   Trich, Wet Prep NONE SEEN NONE SEEN Final   Clue Cells Wet Prep HPF POC PRESENT (A) NONE SEEN Final   WBC, Wet Prep HPF POC MANY (A) NONE SEEN Final   Sperm NONE SEEN  Final    Comment: Performed at [facility], [place] [address] [place], [place] [address]         Radiology Studies: MR THORACIC SPINE WO CONTRAST  Result Date: 01/19/2021 CLINICAL DATA:  Mid back pain lower abdominal pain EXAM: MRI THORACIC AND LUMBAR SPINE WITHOUT CONTRAST TECHNIQUE: Multiplanar and multiecho pulse sequences of the thoracic and lumbar spine were obtained without intravenous contrast. COMPARISON:  CT abdomen/pelvis obtained earlier the same day MRI lumbar spine 08/08/2020 FINDINGS: MRI THORACIC SPINE FINDINGS The patient is status post posterior instrumented fusion from T4 through L3 with posterior  decompression at L3-L4 through L5-S1. Artifact from the hardware markedly degrades evaluation of the canal and surrounding structures. Alignment: There is slight levoscoliosis of the thoracic spine centered at T8. Sagittal alignment appears grossly within expected limits. Vertebrae: Vertebral body heights are preserved. No marrow signal abnormality is identified. Cord:  The cord is not well evaluated. Paraspinal and other soft tissues: The paraspinal soft tissues are unremarkable. There are multiple cavitary appearing lesions in the lungs bilaterally. Disc levels: The canal and neural foramina below the T2-T3 level are not well evaluated. The levels above T2-T3 are normal. MRI LUMBAR SPINE FINDINGS Segmentation:  Standard. Alignment: There is levoscoliosis of the lumbar spine centered at L2-L3. Sagittal alignment appears normal. Vertebrae: Vertebral body heights are preserved. A focus of T2 hyperintensity in the L5 vertebral body is unchanged and may reflect a small hemangioma. There is no other marrow signal abnormality. Conus medullaris and cauda equina: The canal above the L3 level is not well evaluated. The conus is not identified. Paraspinal and other soft tissues: The paraspinal soft tissues are unremarkable. Disc levels: The spinal canal and neural foramina at L2-L3 and above are not well evaluated. There is disc desiccation and narrowing at L4-L5 and L5-S1, similar to the prior study. L3-L4: There are postsurgical changes reflecting posterior decompression. No definite high-grade spinal canal or neural foraminal stenosis. L4-L5: There are postsurgical changes reflecting posterior decompression. There is a mild disc bulge and bilateral facet arthropathy resulting in crowding of the left subarticular zone without evidence of nerve root impingement and mild to moderate left and mild right neural foraminal stenosis, not significantly changed. L5-S1: There are postsurgical changes reflecting posterior disc  compression. There is a left foraminal disc protrusion and bilateral facet arthropathy resulting in moderate left neural foraminal stenosis with contact of the exiting left L5 nerve root. There is no significant spinal canal or right neural foraminal  stenosis. The disc protrusion appears slightly worsened compared to the prior study. IMPRESSION: 1. Postsurgical changes reflecting posterior instrumented fusion extending from T4 through L3 and posterior decompression at L3-L4 through L5-S1. Artifact from the hardware significantly degrades evaluation of the canal and adjacent structures at the surgical levels. 2. Left foraminal disc protrusion and facet arthropathy at L5-S1 result in moderate left neural foraminal stenosis with contact of the exiting L5 nerve root, slightly worsened since 08/08/2020. 3. No other definite high-grade spinal canal or neural foraminal stenosis in the thoracolumbar spine, within the above confines. 4. No definite marrow signal abnormality to suggest infection. 5. Cavitary appearing lesions in the lungs as seen on the same-day CT abdomen/pelvis. Recommend dedicated imaging of the chest. Electronically Signed   By: Valetta Mole M.D.   On: 01/19/2021 08:55   MR LUMBAR SPINE WO CONTRAST  Result Date: 01/19/2021 CLINICAL DATA:  Mid back pain lower abdominal pain EXAM: MRI THORACIC AND LUMBAR SPINE WITHOUT CONTRAST TECHNIQUE: Multiplanar and multiecho pulse sequences of the thoracic and lumbar spine were obtained without intravenous contrast. COMPARISON:  CT abdomen/pelvis obtained earlier the same day MRI lumbar spine 08/08/2020 FINDINGS: MRI THORACIC SPINE FINDINGS The patient is status post posterior instrumented fusion from T4 through L3 with posterior decompression at L3-L4 through L5-S1. Artifact from the hardware markedly degrades evaluation of the canal and surrounding structures. Alignment: There is slight levoscoliosis of the thoracic spine centered at T8. Sagittal alignment appears  grossly within expected limits. Vertebrae: Vertebral body heights are preserved. No marrow signal abnormality is identified. Cord:  The cord is not well evaluated. Paraspinal and other soft tissues: The paraspinal soft tissues are unremarkable. There are multiple cavitary appearing lesions in the lungs bilaterally. Disc levels: The canal and neural foramina below the T2-T3 level are not well evaluated. The levels above T2-T3 are normal. MRI LUMBAR SPINE FINDINGS Segmentation:  Standard. Alignment: There is levoscoliosis of the lumbar spine centered at L2-L3. Sagittal alignment appears normal. Vertebrae: Vertebral body heights are preserved. A focus of T2 hyperintensity in the L5 vertebral body is unchanged and may reflect a small hemangioma. There is no other marrow signal abnormality. Conus medullaris and cauda equina: The canal above the L3 level is not well evaluated. The conus is not identified. Paraspinal and other soft tissues: The paraspinal soft tissues are unremarkable. Disc levels: The spinal canal and neural foramina at L2-L3 and above are not well evaluated. There is disc desiccation and narrowing at L4-L5 and L5-S1, similar to the prior study. L3-L4: There are postsurgical changes reflecting posterior decompression. No definite high-grade spinal canal or neural foraminal stenosis. L4-L5: There are postsurgical changes reflecting posterior decompression. There is a mild disc bulge and bilateral facet arthropathy resulting in crowding of the left subarticular zone without evidence of nerve root impingement and mild to moderate left and mild right neural foraminal stenosis, not significantly changed. L5-S1: There are postsurgical changes reflecting posterior disc compression. There is a left foraminal disc protrusion and bilateral facet arthropathy resulting in moderate left neural foraminal stenosis with contact of the exiting left L5 nerve root. There is no significant spinal canal or right neural  foraminal stenosis. The disc protrusion appears slightly worsened compared to the prior study. IMPRESSION: 1. Postsurgical changes reflecting posterior instrumented fusion extending from T4 through L3 and posterior decompression at L3-L4 through L5-S1. Artifact from the hardware significantly degrades evaluation of the canal and adjacent structures at the surgical levels. 2. Left foraminal disc protrusion and facet arthropathy at  L5-S1 result in moderate left neural foraminal stenosis with contact of the exiting L5 nerve root, slightly worsened since 08/08/2020. 3. No other definite high-grade spinal canal or neural foraminal stenosis in the thoracolumbar spine, within the above confines. 4. No definite marrow signal abnormality to suggest infection. 5. Cavitary appearing lesions in the lungs as seen on the same-day CT abdomen/pelvis. Recommend dedicated imaging of the chest. Electronically Signed   By: Valetta Mole M.D.   On: 01/19/2021 08:55   CT Abdomen Pelvis W Contrast  Result Date: 01/19/2021 CLINICAL DATA:  Abdominal pain, fever, intravenous drug use, urinary retention EXAM: CT ABDOMEN AND PELVIS WITH CONTRAST TECHNIQUE: Multidetector CT imaging of the abdomen and pelvis was performed using the standard protocol following bolus administration of intravenous contrast. CONTRAST:  26m OMNIPAQUE IOHEXOL 350 MG/ML SOLN COMPARISON:  01/15/2019 FINDINGS: Lower chest: Innumerable pulmonary nodules are seen within the visualized lung bases several of which appear cavitary and are suspicious for septic emboli in this patient. No pleural effusion. Cardiac size is mildly enlarged with particular enlargement of the right heart chambers. No pericardial effusion. Hepatobiliary: No focal liver abnormality is seen. Status post cholecystectomy. No biliary dilatation. Pancreas: Unremarkable Spleen: Unremarkable Adrenals/Urinary Tract: The adrenal glands are unremarkable. The kidneys are normal. Foley catheter balloon seen  within a decompressed bladder lumen. Stomach/Bowel: Moderate stool seen within the colon. The stomach, small bowel, and large bowel are otherwise unremarkable. Appendix normal. No free intraperitoneal gas or fluid. Vascular/Lymphatic: No significant vascular findings are present. No enlarged abdominal or pelvic lymph nodes. Reproductive: Uterus and bilateral adnexa are unremarkable. Other: No abdominal wall hernia.  Rectum unremarkable. Musculoskeletal: Harrington rods are partially visualized within the thoracolumbar spine. Resection of the spinous processes of L3, L4, and L5 has been performed. Mild degenerative changes are seen within the lower lumbar spine. No acute bone abnormality. No lytic or blastic bone lesion. IMPRESSION: Innumerable pulmonary nodules within the visualized lung bases several of which demonstrate cavitary change suspicious for innumerable septic emboli. Mild cardiomegaly with stable enlargement of the right heart structures. Moderate stool seen within the colon without evidence of obstruction. Electronically Signed   By: AFidela SalisburyM.D.   On: 01/19/2021 03:47   DG Chest Port 1 View  Result Date: 01/19/2021 CLINICAL DATA:  Questionable sepsis EXAM: PORTABLE CHEST 1 VIEW COMPARISON:  10/26/2020 FINDINGS: Cardiomegaly. Patchy bilateral airspace disease trauma left greater than right. Findings concerning for pneumonia. No effusions or acute bony abnormality. IMPRESSION: Patchy bilateral airspace opacities concerning for pneumonia. Electronically Signed   By: KRolm BaptiseM.D.   On: 01/19/2021 00:39   DG Knee Left Port  Result Date: 01/19/2021 CLINICAL DATA:  Left knee pain, fall, IV drug use EXAM: PORTABLE LEFT KNEE - 1-2 VIEW COMPARISON:  None. FINDINGS: Normal alignment. No fracture or dislocation. Joint spaces are preserved. No effusion. Prepatellar soft tissue swelling is present. IMPRESSION: Prepatellar soft tissue swelling. Electronically Signed   By: AFidela SalisburyM.D.   On:  01/19/2021 02:21   ECHOCARDIOGRAM COMPLETE  Result Date: 01/19/2021    ECHOCARDIOGRAM REPORT   Patient Name:   CDENISHA HOELDate of Exam: 01/19/2021 Medical Rec #:  [id number]         Height:       67.0 in Accession #:    [id number]        Weight:       180.0 lb Date of Birth:  603-28-1987  BSA:          1.934 m Patient Age:    35 years           BP:           135/81 mmHg Patient Gender: F                  HR:           115 bpm. Exam Location:  Inpatient Procedure: 2D Echo, Cardiac Doppler and Color Doppler Indications:    Endocarditis  History:        Patient has prior history of Echocardiogram examinations, most                 recent 10/30/2020.  Sonographer:    MH Referring Phys: [id number] [place]  1. Left ventricular ejection fraction, by estimation, is 40 to 45%. The left ventricle has mildly decreased function. The left ventricle demonstrates global hypokinesis. There is mild left ventricular hypertrophy. Left ventricular diastolic parameters are indeterminate. There is the interventricular septum is flattened in systole and diastole, consistent with right ventricular pressure and volume overload.  2. Right ventricular systolic function is mildly reduced. The right ventricular size is severely enlarged. Mildly increased right ventricular wall thickness. There is severely elevated pulmonary artery systolic pressure. The estimated right ventricular systolic pressure is 54.2 mmHg.  3. Right atrial size was severely dilated.  4. The mitral valve is normal in structure. Trivial mitral valve regurgitation.  5. The tricuspid valve is abnormal. Tricuspid valve regurgitation is severe.  6. The aortic valve was not well visualized. Aortic valve regurgitation is not visualized. No aortic stenosis is present.  7. The inferior vena cava is dilated in size with <50% respiratory variability, suggesting right atrial pressure of 15 mmHg. Conclusion(s)/Recommendation(s): Thickened tricuspid valve  leaflets, no clear vegetation seen. If clinical suspicion for endocarditis, recommend TEE. FINDINGS  Left Ventricle: Left ventricular ejection fraction, by estimation, is 40 to 45%. The left ventricle has mildly decreased function. The left ventricle demonstrates global hypokinesis. The left ventricular internal cavity size was normal in size. There is  mild left ventricular hypertrophy. The interventricular septum is flattened in systole and diastole, consistent with right ventricular pressure and volume overload. Left ventricular diastolic parameters are indeterminate. Right Ventricle: The right ventricular size is severely enlarged. Mildly increased right ventricular wall thickness. Right ventricular systolic function is mildly reduced. There is severely elevated pulmonary artery systolic pressure. The tricuspid regurgitant velocity is 3.66 m/s, and with an assumed right atrial pressure of 15 mmHg, the estimated right ventricular systolic pressure is 70.6 mmHg. Left Atrium: Left atrial size was normal in size. Right Atrium: Right atrial size was severely dilated. Pericardium: There is no evidence of pericardial effusion. Mitral Valve: The mitral valve is normal in structure. Trivial mitral valve regurgitation. Tricuspid Valve: The tricuspid valve is abnormal. Tricuspid valve regurgitation is severe. Aortic Valve: The aortic valve was not well visualized. Aortic valve regurgitation is not visualized. No aortic stenosis is present. Aortic valve mean gradient measures 5.0 mmHg. Aortic valve peak gradient measures 8.8 mmHg. Aortic valve area, by VTI measures 3.05 cm. Pulmonic Valve: The pulmonic valve was not well visualized. Pulmonic valve regurgitation is not visualized. Aorta: The aortic root and ascending aorta are structurally normal, with no evidence of dilitation. Venous: The inferior vena cava is dilated in size with less than 50% respiratory variability, suggesting right atrial pressure of 15 mmHg.  IAS/Shunts: No atrial level shunt detected by  color flow Doppler.  LEFT VENTRICLE PLAX 2D LVIDd:         4.60 cm     Diastology LVIDs:         3.20 cm     LV e' medial:    13.90 cm/s LV PW:         1.30 cm     LV E/e' medial:  4.4 LV IVS:        1.30 cm     LV e' lateral:   20.70 cm/s LVOT diam:     2.30 cm     LV E/e' lateral: 3.0 LV SV:         66 LV SV Index:   34 LVOT Area:     4.15 cm  LV Volumes (MOD) LV vol d, MOD A4C: 51.2 ml LV vol s, MOD A4C: 25.6 ml LV SV MOD A4C:     51.2 ml RIGHT VENTRICLE             IVC RV S prime:     26.70 cm/s  IVC diam: 2.30 cm TAPSE (M-mode): 2.8 cm LEFT ATRIUM           Index       RIGHT ATRIUM           Index LA diam:      2.20 cm 1.14 cm/m  RA Area:     36.30 cm LA Vol (A2C): 16.1 ml 8.33 ml/m  RA Volume:   158.00 ml 81.71 ml/m LA Vol (A4C): 22.2 ml 11.48 ml/m  AORTIC VALVE                    PULMONIC VALVE AV Area (Vmax):    3.42 cm     PV Vmax:       0.86 m/s AV Area (Vmean):   3.34 cm     PV Peak grad:  3.0 mmHg AV Area (VTI):     3.05 cm AV Vmax:           148.00 cm/s AV Vmean:          107.000 cm/s AV VTI:            0.218 m AV Peak Grad:      8.8 mmHg AV Mean Grad:      5.0 mmHg LVOT Vmax:         122.00 cm/s LVOT Vmean:        85.900 cm/s LVOT VTI:          0.160 m LVOT/AV VTI ratio: 0.73  AORTA Ao Root diam: 3.20 cm Ao Asc diam:  2.50 cm MITRAL VALVE               TRICUSPID VALVE MV Area (PHT): 7.59 cm    TR Peak grad:   53.6 mmHg MV E velocity: 61.70 cm/s  TR Vmax:        366.00 cm/s MV A velocity: 78.80 cm/s MV E/A ratio:  0.78        SHUNTS                            Systemic VTI:  0.16 m                            Systemic Diam: 2.30 cm Oswaldo Milian MD Electronically signed by Oswaldo Milian MD Signature Date/Time: 01/19/2021/10:47:41 AM  Final         Scheduled Meds:  Chlorhexidine Gluconate Cloth  6 each Topical Q0600   enoxaparin (LOVENOX) injection  40 mg Subcutaneous Q24H   methadone  30 mg Oral Daily   metroNIDAZOLE  500 mg  Oral Q12H   potassium chloride  40 mEq Oral BID   senna-docusate  1 tablet Oral BID   Continuous Infusions:  vancomycin 1,250 mg (01/20/21 0811)     LOS: 1 day        Phillips Climes, MD [facility]   To contact the attending provider between 7A-7P or the covering provider during after hours 7P-7A, please log into the web site [email or web address] and access using universal  password for that web site. If you do not have the password, please call the hospital operator.  01/20/2021, 1:44 PM

## 2021-01-20 NOTE — Progress Notes (Signed)
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  T-max of 102.6 yesterday.  Patient remains tachypneic and tachycardic, possible opioid withdrawal on top of infection.  Continue vancomycin.  TEE scheduled for Monday.  PLAN: Continue vancomycin TEE on Monday Metronidazole p.o. for BV  Principal Problem:   Sepsis ([place]) Active Problems:   Back pain   Polysubstance abuse (H[facility])   IVDU (intravenous drug user)   Hyponatremia   Endocarditis of tricuspid valve   Severe tricuspid regurgitation   Prolonged QT interval   Constipation   Scheduled Meds:  Chlorhexidine Gluconate Cloth  6 each Topical Q0600   enoxaparin (LOVENOX) injection  40 mg Subcutaneous Q24H   potassium chloride  40 mEq Oral BID   senna-docusate  1 tablet Oral BID   Continuous Infusions:  vancomycin 1,250 mg (01/20/21 0811)   PRN Meds:.acetaminophen, HYDROmorphone (DILAUDID) injection   SUBJECTIVE: Patient is seen at bedside.  She appears uncomfortable.  She reports feeling sick and hurting all over.  States that she last used IV opioid yesterday.  Stated she was on methadone in the past and she typically get methadone while in the hospital for opioid withdrawal.  Review of Systems: ROS Per HPI  Allergies  Allergen Reactions   Hydrocodone Itching   Morphine And Related Nausea And Vomiting    OBJECTIVE: Vitals:   01/20/21 0400 01/20/21 0458 01/20/21 0728 01/20/21 0805  BP: (!) 130/94 (!) 127/93 (!) 126/94 (!) 138/94  Pulse: (!) 111 (!) 104 (!) 113 (!) 103  Resp: 20 20  20   Temp: 100.2 F (37.9 C) 99.7 F (37.6 C) (!) 97.3 F (36.3 C) (!) 97 F (36.1 C)  TempSrc: Oral Oral Oral Oral  SpO2: 97% 96% 95% 95%  Weight:      Height:       Body mass index is 28.19 kg/m.  Physical Exam Constitutional:       General: She is in acute distress.  HENT:     Head: Normocephalic.  Cardiovascular:     Rate and Rhythm: Regular rhythm. Tachycardia present.     Heart sounds: Murmur (3/6 systolic murmur left sternal border) heard.  Pulmonary:     Comments: Tachypneic Musculoskeletal:        General: Normal range of motion.  Skin:    General: Skin is warm.     Coloration: Skin is not jaundiced.  Neurological:     Mental Status: She is alert.  Psychiatric:        Mood and Affect: Mood normal.    Lab Results Lab Results  Component Value Date   WBC 19.0 (H) 01/19/2021   HGB 10.5 (L) 01/19/2021   HCT 33.3 (L) 01/19/2021   MCV 78.2 (L) 01/19/2021   PLT 272 01/19/2021    Lab Results  Component Value Date   CREATININE 0.61 01/19/2021   BUN 9 01/19/2021   NA 129 (L) 01/19/2021   K 3.1 (L) 01/19/2021   CL 90 (L) 01/19/2021   CO2 27 01/19/2021    Lab Results  Component Value Date   ALT 9 01/19/2021   AST 13 (L) 01/19/2021   ALKPHOS 195 (H) 01/19/2021   BILITOT 0.6 01/19/2021     Microbiology: Recent Results (from the past 240 hour(s))  Urine Culture  Status: Abnormal (Preliminary result)   Collection Time: 01/19/21 12:15 AM   Specimen: In/Out Cath Urine  Result Value Ref Range Status   Specimen Description IN/OUT CATH URINE  Final   Special Requests   Final    NONE Performed at [facility], [place] [address] [place], [place] [address]    Culture 50,000 COLONIES/mL STAPHYLOCOCCUS AUREUS (A)  Final   Report Status PENDING  Incomplete  Resp Panel by RT-PCR (Flu A&B, Covid) Nasopharyngeal Swab     Status: None   Collection Time: 01/19/21 12:21 AM   Specimen: Nasopharyngeal Swab; Nasopharyngeal(NP) swabs in vial transport medium  Result Value Ref Range Status   SARS Coronavirus 2 by RT PCR NEGATIVE NEGATIVE Final    Comment: (NOTE) SARS-CoV-2 target nucleic acids are NOT DETECTED.  The SARS-CoV-2 RNA is generally detectable in upper respiratory specimens during the acute  phase of infection. The lowest concentration of SARS-CoV-2 viral copies this assay can detect is 138 copies/mL. A negative result does not preclude SARS-Cov-2 infection and should not be used as the sole basis for treatment or other patient management decisions. A negative result may occur with  improper specimen collection/handling, submission of specimen other than nasopharyngeal swab, presence of viral mutation(s) within the areas targeted by this assay, and inadequate number of viral copies(<138 copies/mL). A negative result must be combined with clinical observations, patient history, and epidemiological information. The expected result is Negative.  Fact Sheet for Patients:  EntrepreneurPulse.com.au  Fact Sheet for Healthcare Providers:  IncredibleEmployment.be  This test is no t yet approved or cleared by the [place] FDA and  has been authorized for detection and/or diagnosis of SARS-CoV-2 by FDA under an Emergency Use Authorization (EUA). This EUA will remain  in effect (meaning this test can be used) for the duration of the COVID-19 declaration under Section 564(b)(1) of the Act, 21 U.S.C.section 360bbb-3(b)(1), unless the authorization is terminated  or revoked sooner.       Influenza A by PCR NEGATIVE NEGATIVE Final   Influenza B by PCR NEGATIVE NEGATIVE Final    Comment: (NOTE) The Xpert Xpress SARS-CoV-2/FLU/RSV plus assay is intended as an aid in the diagnosis of influenza from Nasopharyngeal swab specimens and should not be used as a sole basis for treatment. Nasal washings and aspirates are unacceptable for Xpert Xpress SARS-CoV-2/FLU/RSV testing.  Fact Sheet for Patients: EntrepreneurPulse.com.au  Fact Sheet for Healthcare Providers: IncredibleEmployment.be  This test is not yet approved or cleared by the [place] FDA and has been authorized for detection and/or diagnosis of  SARS-CoV-2 by FDA under an Emergency Use Authorization (EUA). This EUA will remain in effect (meaning this test can be used) for the duration of the COVID-19 declaration under Section 564(b)(1) of the Act, 21 U.S.C. section 360bbb-3(b)(1), unless the authorization is terminated or revoked.  Performed at [facility], [place] [address], [place], [place] [address]   Blood Culture (routine x 2)     Status: Abnormal (Preliminary result)   Collection Time: 01/19/21 12:30 AM   Specimen: BLOOD RIGHT HAND  Result Value Ref Range Status   Specimen Description BLOOD RIGHT HAND  Final   Special Requests   Final    BOTTLES DRAWN AEROBIC AND ANAEROBIC Blood Culture results may not be optimal due to an inadequate volume of blood received in culture bottles   Culture  Setup Time   Final    GRAM POSITIVE COCCI IN CLUSTERS IN BOTH AEROBIC AND ANAEROBIC BOTTLES CRITICAL RESULT  CALLED TO, READ BACK BY AND VERIFIED WITH: PHARMD ALEX L [id number] FCP    Culture (A)  Final    STAPHYLOCOCCUS AUREUS SUSCEPTIBILITIES TO FOLLOW Performed at [facility], [place] [address], [place], [place] [address]    Report Status PENDING  Incomplete  Blood Culture ID Panel (Reflexed)     Status: Abnormal   Collection Time: 01/19/21 12:30 AM  Result Value Ref Range Status   Enterococcus faecalis NOT DETECTED NOT DETECTED Final   Enterococcus Faecium NOT DETECTED NOT DETECTED Final   Listeria monocytogenes NOT DETECTED NOT DETECTED Final   Staphylococcus species DETECTED (A) NOT DETECTED Final    Comment: CRITICAL RESULT CALLED TO, READ BACK BY AND VERIFIED WITH: PHARMD ALEX L [id number] FCP    Staphylococcus aureus (BCID) DETECTED (A) NOT DETECTED Final    Comment: Methicillin (oxacillin)-resistant Staphylococcus aureus (MRSA). MRSA is predictably resistant to beta-lactam antibiotics (except ceftaroline). Preferred therapy is vancomycin unless clinically contraindicated. Patient requires contact precautions if   hospitalized. CRITICAL RESULT CALLED TO, READ BACK BY AND VERIFIED WITH: PHARMD ALEX L [id number] FCP    Staphylococcus epidermidis NOT DETECTED NOT DETECTED Final   Staphylococcus lugdunensis NOT DETECTED NOT DETECTED Final   Streptococcus species NOT DETECTED NOT DETECTED Final   Streptococcus agalactiae NOT DETECTED NOT DETECTED Final   Streptococcus pneumoniae NOT DETECTED NOT DETECTED Final   Streptococcus pyogenes NOT DETECTED NOT DETECTED Final   A.calcoaceticus-baumannii NOT DETECTED NOT DETECTED Final   Bacteroides fragilis NOT DETECTED NOT DETECTED Final   Enterobacterales NOT DETECTED NOT DETECTED Final   Enterobacter cloacae complex NOT DETECTED NOT DETECTED Final   Escherichia coli NOT DETECTED NOT DETECTED Final   Klebsiella aerogenes NOT DETECTED NOT DETECTED Final   Klebsiella oxytoca NOT DETECTED NOT DETECTED Final   Klebsiella pneumoniae NOT DETECTED NOT DETECTED Final   Proteus species NOT DETECTED NOT DETECTED Final   Salmonella species NOT DETECTED NOT DETECTED Final   Serratia marcescens NOT DETECTED NOT DETECTED Final   Haemophilus influenzae NOT DETECTED NOT DETECTED Final   Neisseria meningitidis NOT DETECTED NOT DETECTED Final   Pseudomonas aeruginosa NOT DETECTED NOT DETECTED Final   Stenotrophomonas maltophilia NOT DETECTED NOT DETECTED Final   Candida albicans NOT DETECTED NOT DETECTED Final   Candida auris NOT DETECTED NOT DETECTED Final   Candida glabrata NOT DETECTED NOT DETECTED Final   Candida krusei NOT DETECTED NOT DETECTED Final   Candida parapsilosis NOT DETECTED NOT DETECTED Final   Candida tropicalis NOT DETECTED NOT DETECTED Final   Cryptococcus neoformans/gattii NOT DETECTED NOT DETECTED Final   Meth resistant mecA/C and MREJ DETECTED (A) NOT DETECTED Final    Comment: CRITICAL RESULT CALLED TO, READ BACK BY AND VERIFIED WITH: PHARMD ALEX L [phone] [id number] FCP Performed at [name], [address] [place], [place] [address]    Blood Culture (routine x 2)     Status: Abnormal (Preliminary result)   Collection Time: 01/19/21 12:34 AM   Specimen: BLOOD LEFT WRIST  Result Value Ref Range Status   Specimen Description BLOOD LEFT WRIST  Final   Special Requests   Final    BOTTLES DRAWN AEROBIC AND ANAEROBIC Blood Culture results may not be optimal due to an inadequate volume of blood received in culture bottles   Culture  Setup Time   Final    GRAM POSITIVE COCCI IN BOTH AEROBIC AND ANAEROBIC BOTTLES Performed at [facility], [place] [address], [place], [place] [address]  Culture STAPHYLOCOCCUS AUREUS (A)  Final   Report Status PENDING  Incomplete  Wet prep, genital     Status: Abnormal   Collection Time: 01/19/21  5:20 AM   Specimen: PATH Cytology Cervicovaginal Ancillary Only  Result Value Ref Range Status   Yeast Wet Prep HPF POC NONE SEEN NONE SEEN Final   Trich, Wet Prep NONE SEEN NONE SEEN Final   Clue Cells Wet Prep HPF POC PRESENT (A) NONE SEEN Final   WBC, Wet Prep HPF POC MANY (A) NONE SEEN Final   Sperm NONE SEEN  Final    Comment: Performed at [facility], [place] [address] [place], [place] [address]    Gaylan Gerold, [facility] [phone] pager    01/20/2021, 10:02 AM

## 2021-01-20 NOTE — Progress Notes (Signed)
    [facility] has been requested to perform a transesophageal echocardiogram on 01/23/21 for bacteremia.  After careful review of history and examination, the risks and benefits of transesophageal echocardiogram have been explained including risks of esophageal damage, perforation (1:10,000 risk), bleeding, pharyngeal hematoma as well as other potential complications associated with conscious sedation including aspiration, arrhythmia, respiratory failure and death. Alternatives to treatment were discussed, questions were answered. Patient is willing to proceed.   TEE scheduled for 01/23/21 at 1pm with Dr. Stanford Breed.  Roby Lofts, PA-C 01/20/2021 4:09 PM

## 2021-01-21 DIAGNOSIS — A419 Sepsis, unspecified organism: Secondary | ICD-10-CM | POA: Diagnosis not present

## 2021-01-21 DIAGNOSIS — R7881 Bacteremia: Secondary | ICD-10-CM | POA: Diagnosis not present

## 2021-01-21 DIAGNOSIS — E871 Hypo-osmolality and hyponatremia: Secondary | ICD-10-CM | POA: Diagnosis not present

## 2021-01-21 DIAGNOSIS — F1990 Other psychoactive substance use, unspecified, uncomplicated: Secondary | ICD-10-CM | POA: Diagnosis not present

## 2021-01-21 LAB — URINE CULTURE: Culture: 50000 — AB

## 2021-01-21 LAB — CBC
HCT: 32.6 % — ABNORMAL LOW (ref 36.0–46.0)
Hemoglobin: 10.4 g/dL — ABNORMAL LOW (ref 12.0–15.0)
MCH: 24.6 pg — ABNORMAL LOW (ref 26.0–34.0)
MCHC: 31.9 g/dL (ref 30.0–36.0)
MCV: 77.1 fL — ABNORMAL LOW (ref 80.0–100.0)
Platelets: 228 10*3/uL (ref 150–400)
RBC: 4.23 MIL/uL (ref 3.87–5.11)
RDW: 18.4 % — ABNORMAL HIGH (ref 11.5–15.5)
WBC: 24.2 10*3/uL — ABNORMAL HIGH (ref 4.0–10.5)
nRBC: 0 % (ref 0.0–0.2)

## 2021-01-21 LAB — CULTURE, BLOOD (ROUTINE X 2)

## 2021-01-21 LAB — BASIC METABOLIC PANEL
Anion gap: 10 (ref 5–15)
BUN: 11 mg/dL (ref 6–20)
CO2: 24 mmol/L (ref 22–32)
Calcium: 7.9 mg/dL — ABNORMAL LOW (ref 8.9–10.3)
Chloride: 92 mmol/L — ABNORMAL LOW (ref 98–111)
Creatinine, Ser: 0.55 mg/dL (ref 0.44–1.00)
GFR, Estimated: >60 mL/min (ref >60)
Glucose, Bld: 89 mg/dL (ref 70–99)
Potassium: 4.5 mmol/L (ref 3.5–5.1)
Sodium: 126 mmol/L — ABNORMAL LOW (ref 135–145)

## 2021-01-21 LAB — OSMOLALITY: Osmolality: 258 mOsm/kg — ABNORMAL LOW (ref 275–295)

## 2021-01-21 LAB — MAGNESIUM: Magnesium: 1.9 mg/dL (ref 1.7–2.4)

## 2021-01-21 LAB — SODIUM, URINE, RANDOM: Sodium, Ur: 50 mmol/L

## 2021-01-21 LAB — OSMOLALITY, URINE: Osmolality, Ur: 519 mOsm/kg (ref 300–900)

## 2021-01-21 MED ORDER — BISACODYL 5 MG PO TBEC
10.0000 mg | DELAYED_RELEASE_TABLET | Freq: Once | ORAL | Status: AC
  Administered 2021-01-21: 10 mg via ORAL
  Filled 2021-01-21: qty 2

## 2021-01-21 MED ORDER — MAGNESIUM SULFATE IN D5W 1-5 GM/100ML-% IV SOLN
1.0000 g | Freq: Once | INTRAVENOUS | Status: AC
  Administered 2021-01-21: 1 g via INTRAVENOUS
  Filled 2021-01-21: qty 100

## 2021-01-21 MED ORDER — MAGNESIUM HYDROXIDE 400 MG/5ML PO SUSP
30.0000 mL | Freq: Every day | ORAL | Status: AC
  Administered 2021-01-21: 30 mL via ORAL

## 2021-01-21 MED ORDER — POLYETHYLENE GLYCOL 3350 17 G PO PACK: 17.0000 g | PACK | Freq: Two times a day (BID) | ORAL | Status: DC

## 2021-01-21 MED ORDER — METHADONE HCL 10 MG PO TABS
50.0000 mg | ORAL_TABLET | Freq: Every day | ORAL | Status: DC
Start: 2021-01-21 — End: 2021-01-23
  Administered 2021-01-21 – 2021-01-23 (×3): 50 mg via ORAL
  Filled 2021-01-21 (×3): qty 5

## 2021-01-21 MED ORDER — SODIUM CHLORIDE 0.9 % IV SOLN
INTRAVENOUS | Status: DC | PRN
  Administered 2021-01-21 – 2021-02-18 (×4): 250 mL via INTRAVENOUS

## 2021-01-21 NOTE — Evaluation (Signed)
Physical Therapy Evaluation Patient Details Name: Kelly [name] MRN: [id number] DOB: 21-Jan-1986 Today's Date: 01/21/2021   History of Present Illness  35 y.o. female presents to [name] hospital on 01/19/2021 with pelvic pain. Pt found to have sepsis 2/2 MRSA bacteremia. PMH includes HTN, systolic CHF with moderate pulmonary hypertension and severe TR, polysubstance abuse including history of IV drug use, recurrent history of endocarditis, C. difficile colitis.  Clinical Impression  Pt presents to PT with deficits in activity tolerance, strength, power, functional mobility, gait, balance, endurance, and with significant pain. Pt is limited by reports of pelvic and LE pain and requires encouragement to participate in mobility. Pt requires physical assistance to perform all bed mobility and transfers at this time. Pt declines ambulation due to discomfort. Pt will benefit from aggressive mobilization and acute Pt services to improve activity tolerance and restore independence.    Follow Up Recommendations Home health PT (pt is not functionally appropriate to D/C home at this time, will need to mobilize independently. Likely difficult to place due to history of substance abuse)    Equipment Recommendations  Wheelchair (measurements PT) (if D/C home today)    Recommendations for Other Services       Precautions / Restrictions Precautions Precautions: Fall Restrictions Weight Bearing Restrictions: No      Mobility  Bed Mobility Overal bed mobility: Needs Assistance Bed Mobility: Supine to Sit;Sit to Supine     Supine to sit: Mod assist;HOB elevated Sit to supine: Mod assist   General bed mobility comments: assist for LEs and trunk. Pt requires encouragement to attempt mobility prior to requesting assistance    Transfers Overall transfer level: Needs assistance Equipment used: 1 person hand held assist Transfers: Sit to/from Stand Sit to Stand: Mod assist         General transfer  comment: pt refuses further mobility  Ambulation/Gait                Stairs            Wheelchair Mobility    Modified Rankin (Stroke Patients Only)       Balance Overall balance assessment: Needs assistance Sitting-balance support: Single extremity supported;Bilateral upper extremity supported;Feet supported Sitting balance-Leahy Scale: Poor Sitting balance - Comments: minA, posterior lean Postural control: Posterior lean Standing balance support: Bilateral upper extremity supported Standing balance-Leahy Scale: Poor Standing balance comment: minA with Bilateral hand hold                             Pertinent Vitals/Pain Pain Assessment: Faces Faces Pain Scale: Hurts worst Pain Location: pelvis and LEs Pain Descriptors / Indicators: Aching Pain Intervention(s): Limited activity within patient's tolerance    Home Living Family/patient expects to be discharged to:: Other (Comment)                 Additional Comments: hotel, pt reports living alone. Identifies no caregivers. Reports she still owns a RW    Prior Function Level of Independence: Independent               Hand Dominance        Extremity/Trunk Assessment   Upper Extremity Assessment Upper Extremity Assessment: Overall WFL for tasks assessed    Lower Extremity Assessment Lower Extremity Assessment: Generalized weakness    Cervical / Trunk Assessment Cervical / Trunk Assessment: Normal  Communication   Communication: No difficulties  Cognition Arousal/Alertness: Awake/alert Behavior During Therapy: Anxious Overall Cognitive Status:  Within Functional Limits for tasks assessed                                        General Comments General comments (skin integrity, edema, etc.): pt tachy into 120s, on 3L [place] and desats to high 80s when canula is removed    Exercises     Assessment/Plan    PT Assessment Patient needs continued PT services   PT Problem List Decreased strength;Decreased activity tolerance;Decreased balance;Decreased mobility;Decreased knowledge of use of DME;Decreased safety awareness;Decreased knowledge of precautions;Cardiopulmonary status limiting activity;Pain       PT Treatment Interventions DME instruction;Gait training;Stair training;Functional mobility training;Therapeutic activities;Balance training;Therapeutic exercise;Neuromuscular re-education;Patient/family education    PT Goals (Current goals can be found in the Care Plan section)  Acute Rehab PT Goals Patient Stated Goal: to reduce pain PT Goal Formulation: With patient Time For Goal Achievement: 02/04/21 Potential to Achieve Goals: Fair    Frequency Min 3X/week   Barriers to discharge Decreased caregiver support      Co-evaluation               AM-PAC PT "6 Clicks" Mobility  Outcome Measure Help needed turning from your back to your side while in a flat bed without using bedrails?: A Little Help needed moving from lying on your back to sitting on the side of a flat bed without using bedrails?: A Lot Help needed moving to and from a bed to a chair (including a wheelchair)?: A Lot Help needed standing up from a chair using your arms (e.g., wheelchair or bedside chair)?: A Lot Help needed to walk in hospital room?: Total Help needed climbing 3-5 steps with a railing? : Total 6 Click Score: 11    End of Session Equipment Utilized During Treatment: Oxygen Activity Tolerance: Patient limited by pain Patient left: in bed;with call bell/phone within reach;with bed alarm set Nurse Communication: Mobility status PT Visit Diagnosis: Unsteadiness on feet (R26.81);Other abnormalities of gait and mobility (R26.89);Muscle weakness (generalized) (M62.81);Pain Pain - part of body:  (pelvis and LEs)    Time: 0814-4818 PT Time Calculation (min) (ACUTE ONLY): 20 min   Charges:   PT Evaluation $PT Eval Low Complexity: 1 Low           Zenaida Niece, PT, DPT Acute Rehabilitation Pager: [phone]   Zenaida Niece 01/21/2021, 4:45 PM

## 2021-01-21 NOTE — Progress Notes (Signed)
PROGRESS NOTE    Kelly [name]  HYW:[id number] DOB: 03/14/1986 DOA: 01/19/2021 PCP: Pcp, No   Chief Complaint  Patient presents with   Abdominal Pain    Brief Narrative:   Kelly [name] is a 35 y.o. female with medical history significant of HTN, systolic CHF with moderate pulmonary hypertension and severe TR, polysubstance abuse including history of IV drug use, recurrent history of endocarditis, C. difficile colitis complaints of 3 to 4-day history of pelvic pain. Records note patient was last hospitalized from 6/15-6/24 for multiple abscesses status post I&D with cultures positive for staph aureus treated on IV vancomycin and Unasyn.  Blood cultures were noted to be negative.  Repeat TEE noted possible healed tricuspid vegetation with positive bubble study suggesting PFO with interatrial shunt.  Patient ultimately was discharged to complete course of Zyvox and amoxicillin.  Unclear if patient actually did this. -Patient reports she used IV heroin and cocaine prior to admission, significant for MRSA bacteremia.   Assessment & Plan:   Principal Problem:   Sepsis ([place]) Active Problems:   Back pain   Polysubstance abuse ([place])   IVDU (intravenous drug user)   Hyponatremia   Endocarditis of tricuspid valve   Severe tricuspid regurgitation   Prolonged QT interval   Constipation   Sepsis secondary MRSA bacteremia -Sepsis secondary to MRSA bacteremia, with possible tricuspid valve endocarditis secondary to IV drug use  - ID input greatly appreciated, continue with IV vancomycin for MRSA bacteremia,  - continue with p.o. Flagyl for BV  -Plan for TEE on Monday.    Acute metabolic encephalopathy:  -Currently resolved, most likely due to polysubstance abuse, mentation back to baseline.  Chronic systolic CHF severe tricuspid regurgitation pulmonary hypertension: -   Echocardiogram today noted EF to be 40 to 45% with pulmonary artery hypertension with RVSP 68.6, severe  tricuspid regurgitation, and thickened tricuspid valve leaflets.  Heart function appears similar to last study done in June and at that time bubble study noted concern for interatrial shunt. -Strict I&Os and daily weights -Continue to monitor and evaluate for need of diuresis   Bacterial vaginosis: -  on PO flagyl   Urinary retention:  -Continue Foley catheter, voiding trial when more ambulatory -He is with significant constipation which may contribute to her retention as well, so started on bowel regimen.    Hyponatremia: -Urine sodium and osmolality.   Prolonged QT interval: Acute.   QTc 500, improved, this morning it is 470.  Polysubstance abuse IV drug user: Patient reports recent use of IV heroin as well as intermittent use of cocaine. -Will need to continue to counsel on need of cessation of substance use. -Patient started on methadone, her QTC is 470 today, so we will go ahead and increase further to 50 as she still having withdrawals. -Urine drug screen positive for cocaine, opiates, benzodiazepines and amphetamines     DVT prophylaxis: Lovenox Code Status: Full Family Communication: None at bedside Disposition:   Status is: Inpatient  Remains inpatient appropriate because:IV treatments appropriate due to intensity of illness or inability to take PO  Dispo: The patient is from: Home              Anticipated d/c is to: Home              Patient currently is not medically stable to d/c.   Difficult to place patient Yes       Consultants:  ID   Subjective:  Still complaining of feeling sick,  and hurting all over. Objective: Vitals:   01/21/21 0735 01/21/21 0804 01/21/21 1144 01/21/21 1204  BP: (!) 130/101 (!) 129/99 (!) 125/94 (!) 135/101  Pulse: (!) 112 (!) 104 (!) 109 (!) 108  Resp: (!) 30 20 19 19   Temp: 97.7 F (36.5 C) 97.7 F (36.5 C) 98.7 F (37.1 C) 98.7 F (37.1 C)  TempSrc: Oral Oral Oral Oral  SpO2: 92% 93% 94% 94%  Weight:      Height:         Intake/Output Summary (Last 24 hours) at 01/21/2021 1253 Last data filed at 01/21/2021 0905 Gross per 24 hour  Intake 1581.24 ml  Output 1450 ml  Net 131.24 ml   Filed Weights   01/19/21 0014  Weight: 81.6 kg    Examination:   Awake Alert, Oriented X 3, frail, chronic ill-appearing, uncomfortable  symmetrical Chest wall movement, Good air movement bilaterally, CTAB RRR,No Gallops, No Parasternal Heave,+murmur +ve B.Sounds, Abd Soft, No tenderness, No rebound - guarding or rigidity. No Cyanosis, Clubbing or edema, No new Rash or bruise     Patient was seen with the presence of her CNA at bedside [place]: I have personally reviewed following labs and imaging studies  CBC: Recent Labs  Lab 01/19/21 0030 01/20/21 1132 01/21/21 0021  WBC 19.0* 19.2* 24.2*  NEUTROABS 17.9*  --   --   HGB 10.5* 9.9* 10.4*  HCT 33.3* 32.1* 32.6*  MCV 78.2* 78.9* 77.1*  PLT 272 231 962    Basic Metabolic Panel: Recent Labs  Lab 01/19/21 0030 01/20/21 1132 01/21/21 0021  NA 129* 130* 126*  K 3.1* 4.2 4.5  CL 90* 93* 92*  CO2 27 26 24   GLUCOSE 125* 123* 89  BUN 9 15 11   CREATININE 0.61 0.71 0.55  CALCIUM 8.6* 8.3* 7.9*  MG  --  1.6* 1.9    GFR: Estimated Creatinine Clearance: 106.8 mL/min (by C-G formula based on SCr of 0.55 mg/dL).  Liver Function Tests: Recent Labs  Lab 01/19/21 0030  AST 13*  ALT 9  ALKPHOS 195*  BILITOT 0.6  PROT 6.2*  ALBUMIN 1.8*    CBG: No results for input(s): GLUCAP in the last 168 hours.   Recent Results (from the past 240 hour(s))  Urine Culture     Status: Abnormal   Collection Time: 01/19/21 12:15 AM   Specimen: In/Out Cath Urine  Result Value Ref Range Status   Specimen Description IN/OUT CATH URINE  Final   Special Requests   Final    NONE Performed at [facility], [address], [place], [place] [address]    Culture (A)  Final    50,000 COLONIES/mL METHICILLIN RESISTANT STAPHYLOCOCCUS AUREUS    Report Status 01/21/2021 FINAL  Final   Organism ID, Bacteria METHICILLIN RESISTANT STAPHYLOCOCCUS AUREUS (A)  Final      Susceptibility   Methicillin resistant staphylococcus aureus - MIC*    CIPROFLOXACIN >=8 RESISTANT Resistant     GENTAMICIN <=0.5 SENSITIVE Sensitive     NITROFURANTOIN <=16 SENSITIVE Sensitive     OXACILLIN >=4 RESISTANT Resistant     TETRACYCLINE >=16 RESISTANT Resistant     VANCOMYCIN <=0.5 SENSITIVE Sensitive     TRIMETH/SULFA 160 RESISTANT Resistant     CLINDAMYCIN <=0.25 SENSITIVE Sensitive     RIFAMPIN <=0.5 SENSITIVE Sensitive     Inducible Clindamycin NEGATIVE Sensitive     * 50,000 COLONIES/mL METHICILLIN RESISTANT STAPHYLOCOCCUS AUREUS  Resp Panel by RT-PCR (Flu A&B, Covid)  Nasopharyngeal Swab     Status: None   Collection Time: 01/19/21 12:21 AM   Specimen: Nasopharyngeal Swab; Nasopharyngeal(NP) swabs in vial transport medium  Result Value Ref Range Status   SARS Coronavirus 2 by RT PCR NEGATIVE NEGATIVE Final    Comment: (NOTE) SARS-CoV-2 target nucleic acids are NOT DETECTED.  The SARS-CoV-2 RNA is generally detectable in upper respiratory specimens during the acute phase of infection. The lowest concentration of SARS-CoV-2 viral copies this assay can detect is 138 copies/mL. A negative result does not preclude SARS-Cov-2 infection and should not be used as the sole basis for treatment or other patient management decisions. A negative result may occur with  improper specimen collection/handling, submission of specimen other than nasopharyngeal swab, presence of viral mutation(s) within the areas targeted by this assay, and inadequate number of viral copies(<138 copies/mL). A negative result must be combined with clinical observations, patient history, and epidemiological information. The expected result is Negative.  Fact Sheet for Patients:  EntrepreneurPulse.com.au  Fact Sheet for Healthcare Providers:   IncredibleEmployment.be  This test is no t yet approved or cleared by the [place] FDA and  has been authorized for detection and/or diagnosis of SARS-CoV-2 by FDA under an Emergency Use Authorization (EUA). This EUA will remain  in effect (meaning this test can be used) for the duration of the COVID-19 declaration under Section 564(b)(1) of the Act, 21 U.S.C.section 360bbb-3(b)(1), unless the authorization is terminated  or revoked sooner.       Influenza A by PCR NEGATIVE NEGATIVE Final   Influenza B by PCR NEGATIVE NEGATIVE Final    Comment: (NOTE) The Xpert Xpress SARS-CoV-2/FLU/RSV plus assay is intended as an aid in the diagnosis of influenza from Nasopharyngeal swab specimens and should not be used as a sole basis for treatment. Nasal washings and aspirates are unacceptable for Xpert Xpress SARS-CoV-2/FLU/RSV testing.  Fact Sheet for Patients: EntrepreneurPulse.com.au  Fact Sheet for Healthcare Providers: IncredibleEmployment.be  This test is not yet approved or cleared by the [place] FDA and has been authorized for detection and/or diagnosis of SARS-CoV-2 by FDA under an Emergency Use Authorization (EUA). This EUA will remain in effect (meaning this test can be used) for the duration of the COVID-19 declaration under Section 564(b)(1) of the Act, 21 U.S.C. section 360bbb-3(b)(1), unless the authorization is terminated or revoked.  Performed at [facility], [place] [address], [place], [place] [address]   Blood Culture (routine x 2)     Status: Abnormal   Collection Time: 01/19/21 12:30 AM   Specimen: BLOOD RIGHT HAND  Result Value Ref Range Status   Specimen Description BLOOD RIGHT HAND  Final   Special Requests   Final    BOTTLES DRAWN AEROBIC AND ANAEROBIC Blood Culture results may not be optimal due to an inadequate volume of blood received in culture bottles   Culture  Setup Time   Final     GRAM POSITIVE COCCI IN CLUSTERS IN BOTH AEROBIC AND ANAEROBIC BOTTLES CRITICAL RESULT CALLED TO, READ BACK BY AND VERIFIED WITH: PHARMD ALEX L [id number] FCP    Culture (A)  Final    STAPHYLOCOCCUS AUREUS SUSCEPTIBILITIES PERFORMED ON PREVIOUS CULTURE WITHIN THE LAST 5 DAYS. Performed at [facility], [place] [address] [place], [place] [address]    Report Status 01/21/2021 FINAL  Final  Blood Culture ID Panel (Reflexed)     Status: Abnormal   Collection Time: 01/19/21 12:30 AM  Result Value Ref Range Status  Enterococcus faecalis NOT DETECTED NOT DETECTED Final   Enterococcus Faecium NOT DETECTED NOT DETECTED Final   Listeria monocytogenes NOT DETECTED NOT DETECTED Final   Staphylococcus species DETECTED (A) NOT DETECTED Final    Comment: CRITICAL RESULT CALLED TO, READ BACK BY AND VERIFIED WITH: PHARMD ALEX L 1332 [id number] FCP    Staphylococcus aureus (BCID) DETECTED (A) NOT DETECTED Final    Comment: Methicillin (oxacillin)-resistant Staphylococcus aureus (MRSA). MRSA is predictably resistant to beta-lactam antibiotics (except ceftaroline). Preferred therapy is vancomycin unless clinically contraindicated. Patient requires contact precautions if  hospitalized. CRITICAL RESULT CALLED TO, READ BACK BY AND VERIFIED WITH: PHARMD ALEX L 1332 [id number] FCP    Staphylococcus epidermidis NOT DETECTED NOT DETECTED Final   Staphylococcus lugdunensis NOT DETECTED NOT DETECTED Final   Streptococcus species NOT DETECTED NOT DETECTED Final   Streptococcus agalactiae NOT DETECTED NOT DETECTED Final   Streptococcus pneumoniae NOT DETECTED NOT DETECTED Final   Streptococcus pyogenes NOT DETECTED NOT DETECTED Final   A.calcoaceticus-baumannii NOT DETECTED NOT DETECTED Final   Bacteroides fragilis NOT DETECTED NOT DETECTED Final   Enterobacterales NOT DETECTED NOT DETECTED Final   Enterobacter cloacae complex NOT DETECTED NOT DETECTED Final   Escherichia coli NOT DETECTED NOT DETECTED Final    Klebsiella aerogenes NOT DETECTED NOT DETECTED Final   Klebsiella oxytoca NOT DETECTED NOT DETECTED Final   Klebsiella pneumoniae NOT DETECTED NOT DETECTED Final   Proteus species NOT DETECTED NOT DETECTED Final   Salmonella species NOT DETECTED NOT DETECTED Final   Serratia marcescens NOT DETECTED NOT DETECTED Final   Haemophilus influenzae NOT DETECTED NOT DETECTED Final   Neisseria meningitidis NOT DETECTED NOT DETECTED Final   Pseudomonas aeruginosa NOT DETECTED NOT DETECTED Final   Stenotrophomonas maltophilia NOT DETECTED NOT DETECTED Final   Candida albicans NOT DETECTED NOT DETECTED Final   Candida auris NOT DETECTED NOT DETECTED Final   Candida glabrata NOT DETECTED NOT DETECTED Final   Candida krusei NOT DETECTED NOT DETECTED Final   Candida parapsilosis NOT DETECTED NOT DETECTED Final   Candida tropicalis NOT DETECTED NOT DETECTED Final   Cryptococcus neoformans/gattii NOT DETECTED NOT DETECTED Final   Meth resistant mecA/C and MREJ DETECTED (A) NOT DETECTED Final    Comment: CRITICAL RESULT CALLED TO, READ BACK BY AND VERIFIED WITH: PHARMD ALEX L 1332 [id number] FCP Performed at [facility], [address], [place], [place] [address]   Blood Culture (routine x 2)     Status: Abnormal   Collection Time: 01/19/21 12:34 AM   Specimen: BLOOD LEFT WRIST  Result Value Ref Range Status   Specimen Description BLOOD LEFT WRIST  Final   Special Requests   Final    BOTTLES DRAWN AEROBIC AND ANAEROBIC Blood Culture results may not be optimal due to an inadequate volume of blood received in culture bottles   Culture  Setup Time   Final    GRAM POSITIVE COCCI IN BOTH AEROBIC AND ANAEROBIC BOTTLES Performed at [facility], [place] [address] [place], [place] [address]    Culture METHICILLIN RESISTANT STAPHYLOCOCCUS AUREUS (A)  Final   Report Status 01/21/2021 FINAL  Final   Organism ID, Bacteria METHICILLIN RESISTANT STAPHYLOCOCCUS AUREUS  Final      Susceptibility    Methicillin resistant staphylococcus aureus - MIC*    CIPROFLOXACIN >=8 RESISTANT Resistant     ERYTHROMYCIN >=8 RESISTANT Resistant     GENTAMICIN <=0.5 SENSITIVE Sensitive     OXACILLIN >=4 RESISTANT Resistant     TETRACYCLINE >=  16 RESISTANT Resistant     VANCOMYCIN 1 SENSITIVE Sensitive     TRIMETH/SULFA >=320 RESISTANT Resistant     CLINDAMYCIN <=0.25 SENSITIVE Sensitive     RIFAMPIN <=0.5 SENSITIVE Sensitive     Inducible Clindamycin NEGATIVE Sensitive     * METHICILLIN RESISTANT STAPHYLOCOCCUS AUREUS  Wet prep, genital     Status: Abnormal   Collection Time: 01/19/21  5:20 AM   Specimen: PATH Cytology Cervicovaginal Ancillary Only  Result Value Ref Range Status   Yeast Wet Prep HPF POC NONE SEEN NONE SEEN Final   Trich, Wet Prep NONE SEEN NONE SEEN Final   Clue Cells Wet Prep HPF POC PRESENT (A) NONE SEEN Final   WBC, Wet Prep HPF POC MANY (A) NONE SEEN Final   Sperm NONE SEEN  Final    Comment: Performed at [facility], [place] [address], [place], [place] [address]         Radiology Studies: No results found.      Scheduled Meds:  Chlorhexidine Gluconate Cloth  6 each Topical Q0600   enoxaparin (LOVENOX) injection  40 mg Subcutaneous Q24H   methadone  50 mg Oral Daily   metroNIDAZOLE  500 mg Oral Q12H   polyethylene glycol  17 g Oral Daily   senna-docusate  1 tablet Oral BID   Continuous Infusions:  vancomycin 1,250 mg (01/21/21 0906)     LOS: 2 days        Phillips Climes, MD [facility]   To contact the attending provider between 7A-7P or the covering provider during after hours 7P-7A, please log into the web site [email or web address] and access using universal [place] password for that web site. If you do not have the password, please call the hospital operator.  01/21/2021, 12:53 PM

## 2021-01-22 DIAGNOSIS — A419 Sepsis, unspecified organism: Secondary | ICD-10-CM | POA: Diagnosis not present

## 2021-01-22 DIAGNOSIS — F1990 Other psychoactive substance use, unspecified, uncomplicated: Secondary | ICD-10-CM | POA: Diagnosis not present

## 2021-01-22 DIAGNOSIS — R7881 Bacteremia: Secondary | ICD-10-CM | POA: Diagnosis not present

## 2021-01-22 DIAGNOSIS — E871 Hypo-osmolality and hyponatremia: Secondary | ICD-10-CM | POA: Diagnosis not present

## 2021-01-22 LAB — CBC
HCT: 33.3 % — ABNORMAL LOW (ref 36.0–46.0)
Hemoglobin: 10.7 g/dL — ABNORMAL LOW (ref 12.0–15.0)
MCH: 24.6 pg — ABNORMAL LOW (ref 26.0–34.0)
MCHC: 32.1 g/dL (ref 30.0–36.0)
MCV: 76.6 fL — ABNORMAL LOW (ref 80.0–100.0)
Platelets: 190 10*3/uL (ref 150–400)
RBC: 4.35 MIL/uL (ref 3.87–5.11)
RDW: 18.3 % — ABNORMAL HIGH (ref 11.5–15.5)
WBC: 26.6 10*3/uL — ABNORMAL HIGH (ref 4.0–10.5)
nRBC: 0 % (ref 0.0–0.2)

## 2021-01-22 LAB — BASIC METABOLIC PANEL
Anion gap: 9 (ref 5–15)
BUN: 10 mg/dL (ref 6–20)
CO2: 22 mmol/L (ref 22–32)
Calcium: 7.8 mg/dL — ABNORMAL LOW (ref 8.9–10.3)
Chloride: 93 mmol/L — ABNORMAL LOW (ref 98–111)
Creatinine, Ser: 0.52 mg/dL (ref 0.44–1.00)
GFR, Estimated: >60 mL/min (ref >60)
Glucose, Bld: 108 mg/dL — ABNORMAL HIGH (ref 70–99)
Potassium: 4.1 mmol/L (ref 3.5–5.1)
Sodium: 124 mmol/L — ABNORMAL LOW (ref 135–145)

## 2021-01-22 LAB — MAGNESIUM: Magnesium: 1.7 mg/dL (ref 1.7–2.4)

## 2021-01-22 MED ORDER — MAGNESIUM SULFATE 2 GM/50ML IV SOLN
2.0000 g | Freq: Once | INTRAVENOUS | Status: AC
  Administered 2021-01-22: 2 g via INTRAVENOUS
  Filled 2021-01-22: qty 50

## 2021-01-22 MED ORDER — SODIUM CHLORIDE 1 G PO TABS
2.0000 g | ORAL_TABLET | Freq: Two times a day (BID) | ORAL | Status: AC
  Administered 2021-01-22 (×2): 2 g via ORAL
  Administered 2021-01-23: 1 g via ORAL
  Filled 2021-01-22 (×4): qty 2

## 2021-01-22 MED ORDER — METHADONE HCL 10 MG PO TABS
20.0000 mg | ORAL_TABLET | Freq: Once | ORAL | Status: AC
Start: 2021-01-22 — End: 2021-01-22
  Administered 2021-01-22: 20 mg via ORAL
  Filled 2021-01-22: qty 2

## 2021-01-22 NOTE — Progress Notes (Signed)
PROGRESS NOTE    Kelly [name]  VHQ:[id number] DOB: February 12, 1986 DOA: 01/19/2021 PCP: Pcp, No   Chief Complaint  Patient presents with   Abdominal Pain    Brief Narrative:   Kelly [name] is a 35 y.o. female with medical history significant of HTN, systolic CHF with moderate pulmonary hypertension and severe TR, polysubstance abuse including history of IV drug use, recurrent history of endocarditis, C. difficile colitis complaints of 3 to 4-day history of pelvic pain. Records note patient was last hospitalized from 6/15-6/24 for multiple abscesses status post I&D with cultures positive for staph aureus treated on IV vancomycin and Unasyn.  Blood cultures were noted to be negative.  Repeat TEE noted possible healed tricuspid vegetation with positive bubble study suggesting PFO with interatrial shunt.  Patient ultimately was discharged to complete course of Zyvox and amoxicillin.  Unclear if patient actually did this. -Patient reports she used IV heroin and cocaine prior to admission, significant for MRSA bacteremia.   Assessment & Plan:   Principal Problem:   Sepsis ([place]) Active Problems:   Back pain   Polysubstance abuse ([place])   IVDU (intravenous drug user)   Hyponatremia   Endocarditis of tricuspid valve   Severe tricuspid regurgitation   Prolonged QT interval   Constipation   Sepsis secondary MRSA bacteremia/septic pulmonary emboli -Sepsis secondary to MRSA bacteremia, with possible tricuspid valve endocarditis secondary to IV drug use  - ID input greatly appreciated, continue with IV vancomycin for MRSA bacteremia,  - continue with p.o. Flagyl for BV  -Plan for TEE on Monday.   -She is a small amount of hemoptysis today, this is most likely due to her septic emboli.  Acute metabolic encephalopathy:  -Currently resolved, most likely due to polysubstance abuse, mentation back to baseline.  Hyponatremia -Sodium significantly low at 124 today, overall her sodium is on  the lower side chronically, but not as much. -This appears to be SIADH osmolality of 258, urine osmolality of 519 with urine sodium of 50, she was instructed to decrease her fluid intake, she was started on salt tablets, she had some vomiting after that so likely will need to discontinue.  Chronic systolic CHF severe tricuspid regurgitation pulmonary hypertension: -   Echocardiogram  noted EF to be 40 to 45% with pulmonary artery hypertension with RVSP 68.6, severe tricuspid regurgitation, and thickened tricuspid valve leaflets.  Heart function appears similar to last study done in June and at that time bubble study noted concern for interatrial shunt. -Strict I&Os and daily weights -Far no evidence of volume overload, no indication for diuresis.   Bacterial vaginosis: -  on PO flagyl   Urinary retention:  -Continue Foley catheter, voiding trial when more ambulatory -He is with significant constipation which may contribute to her retention as well, so started on bowel regimen.   Prolonged QT interval: Acute.   QTc 500, improved, needs order for 70s range today, continue to monitor potassium and magnesium.  Polysubstance abuse IV drug user: Patient reports recent use of IV heroin as well as intermittent use of cocaine. -Will need to continue to counsel on need of cessation of substance use. -Patient was started on methadone for withdrawals, this is adjusted frequently, and electrolyte monitored closely, as well her QTC is monitored closely, -Urine drug screen positive for cocaine, opiates, benzodiazepines and amphetamines     DVT prophylaxis: Lovenox Code Status: Full Family Communication: None at bedside Disposition:   Status is: Inpatient  Remains inpatient appropriate because:IV treatments appropriate  due to intensity of illness or inability to take PO  Dispo: The patient is from: Home              Anticipated d/c is to: Home              Patient currently is not medically  stable to d/c.   Difficult to place patient Yes       Consultants:  ID   Subjective:  Reports she is feeling better today, she did have a small amount of scant hemoptysis earlier today, and she had 1 episodes of vomiting later in the day(no blood with vomiting).  She had loose bowel movements after receiving laxatives yesterday. Objective: Vitals:   01/22/21 0409 01/22/21 0534 01/22/21 0753 01/22/21 1218  BP: (!) 123/92  (!) 130/97 (!) 130/102  Pulse: (!) 109 (!) 110 (!) 105 (!) 112  Resp: (!) 29 (!) 26 (!) 23 20  Temp: 98.2 F (36.8 C)  98.7 F (37.1 C) 97.9 F (36.6 C)  TempSrc: Oral  Oral Oral  SpO2: 91% 95% 94% 95%  Weight:  82 kg    Height:        Intake/Output Summary (Last 24 hours) at 01/22/2021 1221 Last data filed at 01/22/2021 1100 Gross per 24 hour  Intake 1478.98 ml  Output 2425 ml  Net -946.02 ml   Filed Weights   01/19/21 0014 01/22/21 0534  Weight: 81.6 kg 82 kg    Examination:  Awake Alert, Oriented X 3, No new F.N deficits, Normal affect, chronically ill-appearing, frail. Symmetrical Chest wall movement, Good air movement bilaterally, CTAB RRR,No Gallops,Rubs, murmur present  +ve B.Sounds, Abd Soft, No tenderness, No rebound - guarding or rigidity. No Cyanosis, Clubbing or edema, No new Rash or bruise     Data Reviewed: I have personally reviewed following labs and imaging studies  CBC: Recent Labs  Lab 01/19/21 0030 01/20/21 1132 01/21/21 0021 01/22/21 0602  WBC 19.0* 19.2* 24.2* 26.6*  NEUTROABS 17.9*  --   --   --   HGB 10.5* 9.9* 10.4* 10.7*  HCT 33.3* 32.1* 32.6* 33.3*  MCV 78.2* 78.9* 77.1* 76.6*  PLT 272 231 228 762    Basic Metabolic Panel: Recent Labs  Lab 01/19/21 0030 01/20/21 1132 01/21/21 0021 01/22/21 0602  NA 129* 130* 126* 124*  K 3.1* 4.2 4.5 4.1  CL 90* 93* 92* 93*  CO2 27 26 24 22   GLUCOSE 125* 123* 89 108*  BUN 9 15 11 10   CREATININE 0.61 0.71 0.55 0.52  CALCIUM 8.6* 8.3* 7.9* 7.8*  MG  --  1.6* 1.9  1.7    GFR: Estimated Creatinine Clearance: 107.1 mL/min (by C-G formula based on SCr of 0.52 mg/dL).  Liver Function Tests: Recent Labs  Lab 01/19/21 0030  AST 13*  ALT 9  ALKPHOS 195*  BILITOT 0.6  PROT 6.2*  ALBUMIN 1.8*    CBG: No results for input(s): GLUCAP in the last 168 hours.   Recent Results (from the past 240 hour(s))  Urine Culture     Status: Abnormal   Collection Time: 01/19/21 12:15 AM   Specimen: In/Out Cath Urine  Result Value Ref Range Status   Specimen Description IN/OUT CATH URINE  Final   Special Requests   Final    NONE Performed at [facility], [address], [place], [place] [address]    Culture (A)  Final    50,000 COLONIES/mL METHICILLIN RESISTANT STAPHYLOCOCCUS AUREUS   Report Status 01/21/2021 FINAL  Final   Organism ID, Bacteria METHICILLIN RESISTANT STAPHYLOCOCCUS AUREUS (A)  Final      Susceptibility   Methicillin resistant staphylococcus aureus - MIC*    CIPROFLOXACIN >=8 RESISTANT Resistant     GENTAMICIN <=0.5 SENSITIVE Sensitive     NITROFURANTOIN <=16 SENSITIVE Sensitive     OXACILLIN >=4 RESISTANT Resistant     TETRACYCLINE >=16 RESISTANT Resistant     VANCOMYCIN <=0.5 SENSITIVE Sensitive     TRIMETH/SULFA 160 RESISTANT Resistant     CLINDAMYCIN <=0.25 SENSITIVE Sensitive     RIFAMPIN <=0.5 SENSITIVE Sensitive     Inducible Clindamycin NEGATIVE Sensitive     * 50,000 COLONIES/mL METHICILLIN RESISTANT STAPHYLOCOCCUS AUREUS  Resp Panel by RT-PCR (Flu A&B, Covid) Nasopharyngeal Swab     Status: None   Collection Time: 01/19/21 12:21 AM   Specimen: Nasopharyngeal Swab; Nasopharyngeal(NP) swabs in vial transport medium  Result Value Ref Range Status   SARS Coronavirus 2 by RT PCR NEGATIVE NEGATIVE Final    Comment: (NOTE) SARS-CoV-2 target nucleic acids are NOT DETECTED.  The SARS-CoV-2 RNA is generally detectable in upper respiratory specimens during the acute phase of infection. The lowest concentration of  SARS-CoV-2 viral copies this assay can detect is 138 copies/mL. A negative result does not preclude SARS-Cov-2 infection and should not be used as the sole basis for treatment or other patient management decisions. A negative result may occur with  improper specimen collection/handling, submission of specimen other than nasopharyngeal swab, presence of viral mutation(s) within the areas targeted by this assay, and inadequate number of viral copies(<138 copies/mL). A negative result must be combined with clinical observations, patient history, and epidemiological information. The expected result is Negative.  Fact Sheet for Patients:  EntrepreneurPulse.com.au  Fact Sheet for Healthcare Providers:  IncredibleEmployment.be  This test is no t yet approved or cleared by the [place] FDA and  has been authorized for detection and/or diagnosis of SARS-CoV-2 by FDA under an Emergency Use Authorization (EUA). This EUA will remain  in effect (meaning this test can be used) for the duration of the COVID-19 declaration under Section 564(b)(1) of the Act, 21 U.S.C.section 360bbb-3(b)(1), unless the authorization is terminated  or revoked sooner.       Influenza A by PCR NEGATIVE NEGATIVE Final   Influenza B by PCR NEGATIVE NEGATIVE Final    Comment: (NOTE) The Xpert Xpress SARS-CoV-2/FLU/RSV plus assay is intended as an aid in the diagnosis of influenza from Nasopharyngeal swab specimens and should not be used as a sole basis for treatment. Nasal washings and aspirates are unacceptable for Xpert Xpress SARS-CoV-2/FLU/RSV testing.  Fact Sheet for Patients: EntrepreneurPulse.com.au  Fact Sheet for Healthcare Providers: IncredibleEmployment.be  This test is not yet approved or cleared by the [place] FDA and has been authorized for detection and/or diagnosis of SARS-CoV-2 by FDA under an Emergency Use  Authorization (EUA). This EUA will remain in effect (meaning this test can be used) for the duration of the COVID-19 declaration under Section 564(b)(1) of the Act, 21 U.S.C. section 360bbb-3(b)(1), unless the authorization is terminated or revoked.  Performed at [facility], [place] [address] [place], [place] [address]   Blood Culture (routine x 2)     Status: Abnormal   Collection Time: 01/19/21 12:30 AM   Specimen: BLOOD RIGHT HAND  Result Value Ref Range Status   Specimen Description BLOOD RIGHT HAND  Final   Special Requests   Final    BOTTLES DRAWN AEROBIC AND ANAEROBIC Blood Culture  results may not be optimal due to an inadequate volume of blood received in culture bottles   Culture  Setup Time   Final    GRAM POSITIVE COCCI IN CLUSTERS IN BOTH AEROBIC AND ANAEROBIC BOTTLES CRITICAL RESULT CALLED TO, READ BACK BY AND VERIFIED WITH: PHARMD ALEX L [id number] FCP    Culture (A)  Final    STAPHYLOCOCCUS AUREUS SUSCEPTIBILITIES PERFORMED ON PREVIOUS CULTURE WITHIN THE LAST 5 DAYS. Performed at [facility], [place] [address], [place], [place] [address]    Report Status 01/21/2021 FINAL  Final  Blood Culture ID Panel (Reflexed)     Status: Abnormal   Collection Time: 01/19/21 12:30 AM  Result Value Ref Range Status   Enterococcus faecalis NOT DETECTED NOT DETECTED Final   Enterococcus Faecium NOT DETECTED NOT DETECTED Final   Listeria monocytogenes NOT DETECTED NOT DETECTED Final   Staphylococcus species DETECTED (A) NOT DETECTED Final    Comment: CRITICAL RESULT CALLED TO, READ BACK BY AND VERIFIED WITH: PHARMD ALEX L [phone] [id number] FCP    Staphylococcus aureus (BCID) DETECTED (A) NOT DETECTED Final    Comment: Methicillin (oxacillin)-resistant Staphylococcus aureus (MRSA). MRSA is predictably resistant to beta-lactam antibiotics (except ceftaroline). Preferred therapy is vancomycin unless clinically contraindicated. Patient requires contact precautions if   hospitalized. CRITICAL RESULT CALLED TO, READ BACK BY AND VERIFIED WITH: PHARMD ALEX L [id number] FCP    Staphylococcus epidermidis NOT DETECTED NOT DETECTED Final   Staphylococcus lugdunensis NOT DETECTED NOT DETECTED Final   Streptococcus species NOT DETECTED NOT DETECTED Final   Streptococcus agalactiae NOT DETECTED NOT DETECTED Final   Streptococcus pneumoniae NOT DETECTED NOT DETECTED Final   Streptococcus pyogenes NOT DETECTED NOT DETECTED Final   A.calcoaceticus-baumannii NOT DETECTED NOT DETECTED Final   Bacteroides fragilis NOT DETECTED NOT DETECTED Final   Enterobacterales NOT DETECTED NOT DETECTED Final   Enterobacter cloacae complex NOT DETECTED NOT DETECTED Final   Escherichia coli NOT DETECTED NOT DETECTED Final   Klebsiella aerogenes NOT DETECTED NOT DETECTED Final   Klebsiella oxytoca NOT DETECTED NOT DETECTED Final   Klebsiella pneumoniae NOT DETECTED NOT DETECTED Final   Proteus species NOT DETECTED NOT DETECTED Final   Salmonella species NOT DETECTED NOT DETECTED Final   Serratia marcescens NOT DETECTED NOT DETECTED Final   Haemophilus influenzae NOT DETECTED NOT DETECTED Final   Neisseria meningitidis NOT DETECTED NOT DETECTED Final   Pseudomonas aeruginosa NOT DETECTED NOT DETECTED Final   Stenotrophomonas maltophilia NOT DETECTED NOT DETECTED Final   Candida albicans NOT DETECTED NOT DETECTED Final   Candida auris NOT DETECTED NOT DETECTED Final   Candida glabrata NOT DETECTED NOT DETECTED Final   Candida krusei NOT DETECTED NOT DETECTED Final   Candida parapsilosis NOT DETECTED NOT DETECTED Final   Candida tropicalis NOT DETECTED NOT DETECTED Final   Cryptococcus neoformans/gattii NOT DETECTED NOT DETECTED Final   Meth resistant mecA/C and MREJ DETECTED (A) NOT DETECTED Final    Comment: CRITICAL RESULT CALLED TO, READ BACK BY AND VERIFIED WITH: PHARMD ALEX L [phone] [id number] FCP Performed at [facility], [address] [place], [place] [address]    Blood Culture (routine x 2)     Status: Abnormal   Collection Time: 01/19/21 12:34 AM   Specimen: BLOOD LEFT WRIST  Result Value Ref Range Status   Specimen Description BLOOD LEFT WRIST  Final   Special Requests   Final    BOTTLES DRAWN AEROBIC AND ANAEROBIC Blood Culture results may not be  optimal due to an inadequate volume of blood received in culture bottles   Culture  Setup Time   Final    GRAM POSITIVE COCCI IN BOTH AEROBIC AND ANAEROBIC BOTTLES Performed at [facility], [place] [address] [place], [place] [address]    Culture METHICILLIN RESISTANT STAPHYLOCOCCUS AUREUS (A)  Final   Report Status 01/21/2021 FINAL  Final   Organism ID, Bacteria METHICILLIN RESISTANT STAPHYLOCOCCUS AUREUS  Final      Susceptibility   Methicillin resistant staphylococcus aureus - MIC*    CIPROFLOXACIN >=8 RESISTANT Resistant     ERYTHROMYCIN >=8 RESISTANT Resistant     GENTAMICIN <=0.5 SENSITIVE Sensitive     OXACILLIN >=4 RESISTANT Resistant     TETRACYCLINE >=16 RESISTANT Resistant     VANCOMYCIN 1 SENSITIVE Sensitive     TRIMETH/SULFA >=320 RESISTANT Resistant     CLINDAMYCIN <=0.25 SENSITIVE Sensitive     RIFAMPIN <=0.5 SENSITIVE Sensitive     Inducible Clindamycin NEGATIVE Sensitive     * METHICILLIN RESISTANT STAPHYLOCOCCUS AUREUS  Wet prep, genital     Status: Abnormal   Collection Time: 01/19/21  5:20 AM   Specimen: PATH Cytology Cervicovaginal Ancillary Only  Result Value Ref Range Status   Yeast Wet Prep HPF POC NONE SEEN NONE SEEN Final   Trich, Wet Prep NONE SEEN NONE SEEN Final   Clue Cells Wet Prep HPF POC PRESENT (A) NONE SEEN Final   WBC, Wet Prep HPF POC MANY (A) NONE SEEN Final   Sperm NONE SEEN  Final    Comment: Performed at [facility], [place] [address], [place], [place] [address]         Radiology Studies: No results found.      Scheduled Meds:  Chlorhexidine Gluconate Cloth  6 each Topical Q0600   enoxaparin (LOVENOX) injection  40 mg  Subcutaneous Q24H   magnesium hydroxide  30 mL Oral Daily   methadone  20 mg Oral Once   methadone  50 mg Oral Daily   metroNIDAZOLE  500 mg Oral Q12H   polyethylene glycol  17 g Oral BID   senna-docusate  1 tablet Oral BID   sodium chloride  2 g Oral BID WC   Continuous Infusions:  sodium chloride Stopped (01/22/21 0139)   vancomycin 1,250 mg (01/22/21 1140)     LOS: 3 days        Phillips Climes, MD [facility]   To contact the attending provider between 7A-7P or the covering provider during after hours 7P-7A, please log into the web site [email or web address] and access using universal [place] password for that web site. If you do not have the password, please call the hospital operator.  01/22/2021, 12:21 PM

## 2021-01-22 NOTE — Progress Notes (Signed)
Pharmacy Antibiotic Note  Kelly [name] is a 35 y.o. female admitted on 01/19/2021 with  MRSA bacteremia with r/o endocarditis .  Pharmacy has been consulted for Vanco dosing.  ID: Sepsis with MRSA bacteremia/septic pulmonary emboli, with possible tricuspid valve endocarditis secondary to IV drug use  - WBC 26.6 up again today. Afebrile. Scr <1  Vancomycin 9/8> Cefepime 9/8>9/8 Metronidazole (BV)>x 7 days  9//8 BC x 2- > MRSA 9/8: UCx: MRSA, 50,000 colonies.   Plan: Vancomycin 1250 mg IV every 12 hours (eAUC 513, SCr 0.8 used) TEE Monday Peak/trough around dose tonight.    Height: 5' 7"  (170.2 cm) Weight: 82 kg (180 lb 12.4 oz) IBW/kg (Calculated) : 61.6  Temp (24hrs), Avg:98.3 F (36.8 C), Min:97.9 F (36.6 C), Max:98.7 F (37.1 C)  Recent Labs  Lab 01/19/21 0015 01/19/21 0030 01/19/21 0530 01/19/21 0918 01/20/21 0253 01/20/21 1132 01/21/21 0021 01/22/21 0602  WBC  --  19.0*  --   --   --  19.2* 24.2* 26.6*  CREATININE  --  0.61  --   --   --  0.71 0.55 0.52  LATICACIDVEN 2.6*  --  2.4* 1.0 1.2  --   --   --     Estimated Creatinine Clearance: 107.1 mL/min (by C-G formula based on SCr of 0.52 mg/dL).    Allergies  Allergen Reactions   Hydrocodone Itching   Morphine And Related Nausea And Vomiting    [name] S. Alford Highland, PharmD, BCPS Clinical Staff Pharmacist [email or web address]  Wayland Salinas 01/22/2021 2:07 PM

## 2021-01-23 ENCOUNTER — Encounter (HOSPITAL_COMMUNITY)
Admission: EM | Disposition: A | Discharge status: Home / Self Care | Payer: Self-pay | Source: Home / Self Care | Attending: Internal Medicine

## 2021-01-23 ENCOUNTER — Other Ambulatory Visit (HOSPITAL_COMMUNITY): Payer: Medicaid Other

## 2021-01-23 ENCOUNTER — Ambulatory Visit (HOSPITAL_COMMUNITY): Admit: 2021-01-23 | Payer: Medicaid Other | Admitting: Cardiology

## 2021-01-23 DIAGNOSIS — I079 Rheumatic tricuspid valve disease, unspecified: Secondary | ICD-10-CM | POA: Diagnosis not present

## 2021-01-23 DIAGNOSIS — B9562 Methicillin resistant Staphylococcus aureus infection as the cause of diseases classified elsewhere: Secondary | ICD-10-CM | POA: Diagnosis not present

## 2021-01-23 DIAGNOSIS — E871 Hypo-osmolality and hyponatremia: Secondary | ICD-10-CM | POA: Diagnosis not present

## 2021-01-23 DIAGNOSIS — F1910 Other psychoactive substance abuse, uncomplicated: Secondary | ICD-10-CM | POA: Diagnosis not present

## 2021-01-23 DIAGNOSIS — R7881 Bacteremia: Secondary | ICD-10-CM

## 2021-01-23 DIAGNOSIS — F1990 Other psychoactive substance use, unspecified, uncomplicated: Secondary | ICD-10-CM | POA: Diagnosis not present

## 2021-01-23 LAB — BASIC METABOLIC PANEL
Anion gap: 11 (ref 5–15)
BUN: 14 mg/dL (ref 6–20)
CO2: 20 mmol/L — ABNORMAL LOW (ref 22–32)
Calcium: 7.6 mg/dL — ABNORMAL LOW (ref 8.9–10.3)
Chloride: 94 mmol/L — ABNORMAL LOW (ref 98–111)
Creatinine, Ser: 0.57 mg/dL (ref 0.44–1.00)
GFR, Estimated: >60 mL/min (ref >60)
Glucose, Bld: 109 mg/dL — ABNORMAL HIGH (ref 70–99)
Potassium: 4.7 mmol/L (ref 3.5–5.1)
Sodium: 125 mmol/L — ABNORMAL LOW (ref 135–145)

## 2021-01-23 LAB — CBC
HCT: 34 % — ABNORMAL LOW (ref 36.0–46.0)
Hemoglobin: 10.4 g/dL — ABNORMAL LOW (ref 12.0–15.0)
MCH: 24.1 pg — ABNORMAL LOW (ref 26.0–34.0)
MCHC: 30.6 g/dL (ref 30.0–36.0)
MCV: 78.9 fL — ABNORMAL LOW (ref 80.0–100.0)
Platelets: 189 10*3/uL (ref 150–400)
RBC: 4.31 MIL/uL (ref 3.87–5.11)
RDW: 18.5 % — ABNORMAL HIGH (ref 11.5–15.5)
WBC: 26.1 10*3/uL — ABNORMAL HIGH (ref 4.0–10.5)
nRBC: 0 % (ref 0.0–0.2)

## 2021-01-23 LAB — VANCOMYCIN, PEAK: Vancomycin Pk: 39 ug/mL (ref 30–40)

## 2021-01-23 LAB — VANCOMYCIN, TROUGH: Vancomycin Tr: 6 ug/mL — ABNORMAL LOW (ref 15–20)

## 2021-01-23 LAB — PROCALCITONIN: Procalcitonin: 0.34 ng/mL

## 2021-01-23 LAB — MAGNESIUM: Magnesium: 1.8 mg/dL (ref 1.7–2.4)

## 2021-01-23 SURGERY — ECHOCARDIOGRAM, TRANSESOPHAGEAL
Anesthesia: Monitor Anesthesia Care

## 2021-01-23 MED ORDER — METHADONE HCL 10 MG PO TABS
60.0000 mg | ORAL_TABLET | Freq: Every day | ORAL | Status: DC
  Administered 2021-01-24 – 2021-01-28 (×5): 60 mg via ORAL
  Filled 2021-01-23 (×5): qty 6

## 2021-01-23 MED ORDER — METHADONE HCL 10 MG PO TABS
10.0000 mg | ORAL_TABLET | Freq: Once | ORAL | Status: AC
  Administered 2021-01-23: 10 mg via ORAL
  Filled 2021-01-23: qty 1

## 2021-01-23 NOTE — Progress Notes (Addendum)
[facility]  Date of Admission:  01/19/2021      Total days of antibiotics 5  Vancomycin 9/08 >>          ASSESSMENT: Kelly [name] is a 35 y.o. 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 She was readmitted with chest pain/cough and back pain.   Last TEE was 11/01/2020 >> EF 40-45%, severe TV regurgitation with mobile calcified mass vs torn cord from old endocarditis.  TTE 01/19/2021 >> EF 40-45%, severe TV regurgitation without any obvious vegetation. CT scan of abdomen/pelvis mentioned Innumerable pulmonary nodules within the visualized lung bases several of which demonstrate cavitary change suspicious for innumerable septic emboli.  D/W Dr. Gale Journey and we feel a TEE is unnecessary given the constellation of findings above and the fact that she is not a surgical candidate for valve replacement. We discussed this with [name] today. Will order heart healthy diet.   Will repeat blood cultures today to document clearance of her bacteremia. She should have pelvic MRI done to evaluate complaints of anterior hip/groin pain bilaterally. She states she will need a dose of xanax to tolerate this. I will D/W Dr. [facility] about a plan for her. 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     PLAN: Cancel TEE (I have called cardiology) Heart healthy diet OK now Will need pelvic MRI w/ and w/o contrast to evaluate for possible myositis/abscess. Spine imaging clean 9/8.  Repeat blood cultures now Please hold on PICC for 48h after blood cultures are collected    Principal Problem:   MRSA bacteremia Active Problems:   Endocarditis of tricuspid valve   Severe tricuspid regurgitation   Back pain   Polysubstance abuse (HCC)   IVDU (intravenous drug user)   Hyponatremia   Prolonged QT interval   Sepsis (HCC)    Constipation    Chlorhexidine Gluconate Cloth  6 each Topical Q0600   enoxaparin (LOVENOX) injection  40 mg Subcutaneous Q24H   magnesium hydroxide  30 mL Oral Daily   methadone  50 mg Oral Daily   metroNIDAZOLE  500 mg Oral Q12H   senna-docusate  1 tablet Oral BID   sodium chloride  2 g Oral BID WC    SUBJECTIVE: Her whole body hurts, but most specifically concerned about the pelvic/groin pain that she has bilaterally. Back pain is severe.  Has a hard time with standing due to pain and weakness.  No fevers/chills. She is still having some SOB and cough./chest pain. Some hemoptysis noted.    Review of Systems: Review of Systems  Constitutional:  Positive for malaise/fatigue. Negative for chills and fever.  HENT:  Negative for tinnitus.   Eyes:  Negative for blurred vision and photophobia.  Respiratory:  Positive for cough, hemoptysis, sputum production and shortness of breath.   Cardiovascular:  Positive for leg swelling. Negative for chest pain.  Gastrointestinal:  Positive for abdominal pain. Negative for diarrhea, nausea and vomiting.  Genitourinary:  Negative for dysuria.  Musculoskeletal:  Positive for back pain and joint pain.  Skin:  Negative for rash.  Neurological:  Positive for weakness. Negative for headaches.   Allergies  Allergen Reactions   Hydrocodone Itching   Morphine And Related Nausea And Vomiting    OBJECTIVE: Vitals:   01/23/21 0229 01/23/21 0400 01/23/21 0511 01/23/21 8016  BP: (!) 131/95 116/85 114/83 (!) 119/92  Pulse: (!) 113 100 97 (!) 109  Resp: (!) 25 (!) 25 (!) 22 20  Temp: 99 F (37.2 C) 97.7 F (36.5 C) 97.8 F (36.6 C) 97.9 F (36.6 C)  TempSrc: Oral Oral Oral Oral  SpO2: 92% 93% 95% 91%  Weight:      Height:       Body mass index is 28.31 kg/m.  Physical Exam Vitals reviewed. Exam conducted with a chaperone present.  Constitutional:      Appearance: She is well-developed.     Comments: Appears uncomfortable in bed.   HENT:      Mouth/Throat:     Mouth: No oral lesions.     Dentition: Normal dentition. No dental abscesses.     Pharynx: No oropharyngeal exudate.  Cardiovascular:     Rate and Rhythm: Regular rhythm. Tachycardia present.     Heart sounds: Murmur heard.  Pulmonary:     Effort: Pulmonary effort is normal.     Breath sounds: Rhonchi present.     Comments: Wearing 2-3 LPM [place] with sats 90-92% Abdominal:     General: Bowel sounds are normal. There is no distension.     Palpations: Abdomen is soft.     Tenderness: There is generalized abdominal tenderness.  Lymphadenopathy:     Cervical: No cervical adenopathy.  Skin:    General: Skin is warm and dry.     Capillary Refill: Capillary refill takes less than 2 seconds.     Findings: No rash.  Neurological:     General: No focal deficit present.     Mental Status: She is alert and oriented to person, place, and time.  Psychiatric:        Judgment: Judgment normal.     Comments: In good spirits today and engaged in care discussion    Lab Results Lab Results  Component Value Date   WBC 26.1 (H) 01/23/2021   HGB 10.4 (L) 01/23/2021   HCT 34.0 (L) 01/23/2021   MCV 78.9 (L) 01/23/2021   PLT 189 01/23/2021    Lab Results  Component Value Date   CREATININE 0.57 01/23/2021   BUN 14 01/23/2021   NA 125 (L) 01/23/2021   K 4.7 01/23/2021   CL 94 (L) 01/23/2021   CO2 20 (L) 01/23/2021    Lab Results  Component Value Date   ALT 9 01/19/2021   AST 13 (L) 01/19/2021   ALKPHOS 195 (H) 01/19/2021   BILITOT 0.6 01/19/2021     Microbiology: Recent Results (from the past 240 hour(s))  Urine Culture     Status: Abnormal   Collection Time: 01/19/21 12:15 AM   Specimen: In/Out Cath Urine  Result Value Ref Range Status   Specimen Description IN/OUT CATH URINE  Final   Special Requests   Final    NONE Performed at [facility], [address] [place], [place] [address]    Culture (A)  Final    50,000 COLONIES/mL METHICILLIN RESISTANT  STAPHYLOCOCCUS AUREUS   Report Status 01/21/2021 FINAL  Final   Organism ID, Bacteria METHICILLIN RESISTANT STAPHYLOCOCCUS AUREUS (A)  Final      Susceptibility   Methicillin resistant staphylococcus aureus - MIC*    CIPROFLOXACIN >=8 RESISTANT Resistant     GENTAMICIN <=0.5 SENSITIVE Sensitive     NITROFURANTOIN <=16 SENSITIVE Sensitive     OXACILLIN >=4 RESISTANT Resistant     TETRACYCLINE >=16 RESISTANT Resistant     VANCOMYCIN <=0.5  SENSITIVE Sensitive     TRIMETH/SULFA 160 RESISTANT Resistant     CLINDAMYCIN <=0.25 SENSITIVE Sensitive     RIFAMPIN <=0.5 SENSITIVE Sensitive     Inducible Clindamycin NEGATIVE Sensitive     * 50,000 COLONIES/mL METHICILLIN RESISTANT STAPHYLOCOCCUS AUREUS  Resp Panel by RT-PCR (Flu A&B, Covid) Nasopharyngeal Swab     Status: None   Collection Time: 01/19/21 12:21 AM   Specimen: Nasopharyngeal Swab; Nasopharyngeal(NP) swabs in vial transport medium  Result Value Ref Range Status   SARS Coronavirus 2 by RT PCR NEGATIVE NEGATIVE Final    Comment: (NOTE) SARS-CoV-2 target nucleic acids are NOT DETECTED.  The SARS-CoV-2 RNA is generally detectable in upper respiratory specimens during the acute phase of infection. The lowest concentration of SARS-CoV-2 viral copies this assay can detect is 138 copies/mL. A negative result does not preclude SARS-Cov-2 infection and should not be used as the sole basis for treatment or other patient management decisions. A negative result may occur with  improper specimen collection/handling, submission of specimen other than nasopharyngeal swab, presence of viral mutation(s) within the areas targeted by this assay, and inadequate number of viral copies(<138 copies/mL). A negative result must be combined with clinical observations, patient history, and epidemiological information. The expected result is Negative.  Fact Sheet for Patients:  EntrepreneurPulse.com.au  Fact Sheet for Healthcare  Providers:  IncredibleEmployment.be  This test is no t yet approved or cleared by the [place] FDA and  has been authorized for detection and/or diagnosis of SARS-CoV-2 by FDA under an Emergency Use Authorization (EUA). This EUA will remain  in effect (meaning this test can be used) for the duration of the COVID-19 declaration under Section 564(b)(1) of the Act, 21 U.S.C.section 360bbb-3(b)(1), unless the authorization is terminated  or revoked sooner.       Influenza A by PCR NEGATIVE NEGATIVE Final   Influenza B by PCR NEGATIVE NEGATIVE Final    Comment: (NOTE) The Xpert Xpress SARS-CoV-2/FLU/RSV plus assay is intended as an aid in the diagnosis of influenza from Nasopharyngeal swab specimens and should not be used as a sole basis for treatment. Nasal washings and aspirates are unacceptable for Xpert Xpress SARS-CoV-2/FLU/RSV testing.  Fact Sheet for Patients: EntrepreneurPulse.com.au  Fact Sheet for Healthcare Providers: IncredibleEmployment.be  This test is not yet approved or cleared by the [place] FDA and has been authorized for detection and/or diagnosis of SARS-CoV-2 by FDA under an Emergency Use Authorization (EUA). This EUA will remain in effect (meaning this test can be used) for the duration of the COVID-19 declaration under Section 564(b)(1) of the Act, 21 U.S.C. section 360bbb-3(b)(1), unless the authorization is terminated or revoked.  Performed at [facility], [place] [address], [place], [place] [address]   Blood Culture (routine x 2)     Status: Abnormal   Collection Time: 01/19/21 12:30 AM   Specimen: BLOOD RIGHT HAND  Result Value Ref Range Status   Specimen Description BLOOD RIGHT HAND  Final   Special Requests   Final    BOTTLES DRAWN AEROBIC AND ANAEROBIC Blood Culture results may not be optimal due to an inadequate volume of blood received in culture bottles   Culture  Setup Time    Final    GRAM POSITIVE COCCI IN CLUSTERS IN BOTH AEROBIC AND ANAEROBIC BOTTLES CRITICAL RESULT CALLED TO, READ BACK BY AND VERIFIED WITH: PHARMD ALEX L [id number] FCP    Culture (A)  Final    STAPHYLOCOCCUS AUREUS SUSCEPTIBILITIES PERFORMED ON PREVIOUS CULTURE  WITHIN THE LAST 5 DAYS. Performed at [facility], [place] [address] [place], [place] [address]    Report Status 01/21/2021 FINAL  Final  Blood Culture ID Panel (Reflexed)     Status: Abnormal   Collection Time: 01/19/21 12:30 AM  Result Value Ref Range Status   Enterococcus faecalis NOT DETECTED NOT DETECTED Final   Enterococcus Faecium NOT DETECTED NOT DETECTED Final   Listeria monocytogenes NOT DETECTED NOT DETECTED Final   Staphylococcus species DETECTED (A) NOT DETECTED Final    Comment: CRITICAL RESULT CALLED TO, READ BACK BY AND VERIFIED WITH: PHARMD ALEX L [id number] FCP    Staphylococcus aureus (BCID) DETECTED (A) NOT DETECTED Final    Comment: Methicillin (oxacillin)-resistant Staphylococcus aureus (MRSA). MRSA is predictably resistant to beta-lactam antibiotics (except ceftaroline). Preferred therapy is vancomycin unless clinically contraindicated. Patient requires contact precautions if  hospitalized. CRITICAL RESULT CALLED TO, READ BACK BY AND VERIFIED WITH: PHARMD ALEX L [id number] FCP    Staphylococcus epidermidis NOT DETECTED NOT DETECTED Final   Staphylococcus lugdunensis NOT DETECTED NOT DETECTED Final   Streptococcus species NOT DETECTED NOT DETECTED Final   Streptococcus agalactiae NOT DETECTED NOT DETECTED Final   Streptococcus pneumoniae NOT DETECTED NOT DETECTED Final   Streptococcus pyogenes NOT DETECTED NOT DETECTED Final   A.calcoaceticus-baumannii NOT DETECTED NOT DETECTED Final   Bacteroides fragilis NOT DETECTED NOT DETECTED Final   Enterobacterales NOT DETECTED NOT DETECTED Final   Enterobacter cloacae complex NOT DETECTED NOT DETECTED Final   Escherichia coli NOT DETECTED NOT DETECTED  Final   Klebsiella aerogenes NOT DETECTED NOT DETECTED Final   Klebsiella oxytoca NOT DETECTED NOT DETECTED Final   Klebsiella pneumoniae NOT DETECTED NOT DETECTED Final   Proteus species NOT DETECTED NOT DETECTED Final   Salmonella species NOT DETECTED NOT DETECTED Final   Serratia marcescens NOT DETECTED NOT DETECTED Final   Haemophilus influenzae NOT DETECTED NOT DETECTED Final   Neisseria meningitidis NOT DETECTED NOT DETECTED Final   Pseudomonas aeruginosa NOT DETECTED NOT DETECTED Final   Stenotrophomonas maltophilia NOT DETECTED NOT DETECTED Final   Candida albicans NOT DETECTED NOT DETECTED Final   Candida auris NOT DETECTED NOT DETECTED Final   Candida glabrata NOT DETECTED NOT DETECTED Final   Candida krusei NOT DETECTED NOT DETECTED Final   Candida parapsilosis NOT DETECTED NOT DETECTED Final   Candida tropicalis NOT DETECTED NOT DETECTED Final   Cryptococcus neoformans/gattii NOT DETECTED NOT DETECTED Final   Meth resistant mecA/C and MREJ DETECTED (A) NOT DETECTED Final    Comment: CRITICAL RESULT CALLED TO, READ BACK BY AND VERIFIED WITH: PHARMD ALEX L [phone] [id number] FCP Performed at [facility], [address], [place], [place] [address]   Blood Culture (routine x 2)     Status: Abnormal   Collection Time: 01/19/21 12:34 AM   Specimen: BLOOD LEFT WRIST  Result Value Ref Range Status   Specimen Description BLOOD LEFT WRIST  Final   Special Requests   Final    BOTTLES DRAWN AEROBIC AND ANAEROBIC Blood Culture results may not be optimal due to an inadequate volume of blood received in culture bottles   Culture  Setup Time   Final    GRAM POSITIVE COCCI IN BOTH AEROBIC AND ANAEROBIC BOTTLES Performed at [facility], [place] [address], [place], [place] [address]    Culture METHICILLIN RESISTANT STAPHYLOCOCCUS AUREUS (A)  Final   Report Status 01/21/2021 FINAL  Final   Organism ID, Bacteria METHICILLIN RESISTANT STAPHYLOCOCCUS  AUREUS  Final      Susceptibility    Methicillin resistant staphylococcus aureus - MIC*    CIPROFLOXACIN >=8 RESISTANT Resistant     ERYTHROMYCIN >=8 RESISTANT Resistant     GENTAMICIN <=0.5 SENSITIVE Sensitive     OXACILLIN >=4 RESISTANT Resistant     TETRACYCLINE >=16 RESISTANT Resistant     VANCOMYCIN 1 SENSITIVE Sensitive     TRIMETH/SULFA >=320 RESISTANT Resistant     CLINDAMYCIN <=0.25 SENSITIVE Sensitive     RIFAMPIN <=0.5 SENSITIVE Sensitive     Inducible Clindamycin NEGATIVE Sensitive     * METHICILLIN RESISTANT STAPHYLOCOCCUS AUREUS  Wet prep, genital     Status: Abnormal   Collection Time: 01/19/21  5:20 AM   Specimen: PATH Cytology Cervicovaginal Ancillary Only  Result Value Ref Range Status   Yeast Wet Prep HPF POC NONE SEEN NONE SEEN Final   Trich, Wet Prep NONE SEEN NONE SEEN Final   Clue Cells Wet Prep HPF POC PRESENT (A) NONE SEEN Final   WBC, Wet Prep HPF POC MANY (A) NONE SEEN Final   Sperm NONE SEEN  Final    Comment: Performed at [facility], [place] [address] [place], [place] [address]    Janene Madeira, MSN, NP-C [facility] Cell: [phone] Pager: [phone]  @TODAY @ 11:53 AM

## 2021-01-23 NOTE — Progress Notes (Signed)
PROGRESS NOTE    Kelly [name]  NWG:[id number] DOB: 1985-08-07 DOA: 01/19/2021 PCP: Pcp, No   Chief Complaint  Patient presents with   Abdominal Pain    Brief Narrative:   Kelly [name] is a 35 y.o. female with medical history significant of HTN, systolic CHF with moderate pulmonary hypertension and severe TR, polysubstance abuse including history of IV drug use, recurrent history of endocarditis, C. difficile colitis complaints of 3 to 4-day history of pelvic pain. Records note patient was last hospitalized from 6/15-6/24 for multiple abscesses status post I&D with cultures positive for staph aureus treated on IV vancomycin and Unasyn.  Blood cultures were noted to be negative.  Repeat TEE noted possible healed tricuspid vegetation with positive bubble study suggesting PFO with interatrial shunt.  Patient ultimately was discharged to complete course of Zyvox and amoxicillin.  Unclear if patient actually did this. -Patient reports she used IV heroin and cocaine prior to admission, significant for MRSA bacteremia.   Assessment & Plan:   Principal Problem:   MRSA bacteremia Active Problems:   Back pain   Polysubstance abuse (HCC)   IVDU (intravenous drug user)   Hyponatremia   Endocarditis of tricuspid valve   Severe tricuspid regurgitation   Prolonged QT interval   Sepsis (HCC)   Constipation   Sepsis secondary MRSA bacteremia/septic pulmonary emboli -Sepsis secondary to MRSA bacteremia, with possible tricuspid valve endocarditis secondary to IV drug use  - ID input greatly appreciated, continue with IV vancomycin for MRSA bacteremia,  - continue with p.o. Flagyl for BV  -Management per ID, at this point TEE will not change management as she is not a surgical candidate, and she will need prolonged antibiotic therapy, so it was canceled. -Pelvic MRI is pending given soft pain in pelvic area. -Follow on blood cultures today, no PICC line yet.  Acute metabolic  encephalopathy:  -Currently resolved, most likely due to polysubstance abuse, mentation back to baseline.  Hyponatremia -Sodium significantly low at 124 today, overall her sodium is on the lower side chronically, but not as much. -This appears to be SIADH osmolality of 258, urine osmolality of 519 with urine sodium of 50. -Continue with fluid restrictions .  Chronic systolic CHF severe tricuspid regurgitation pulmonary hypertension: -   Echocardiogram  noted EF to be 40 to 45% with pulmonary artery hypertension with RVSP 68.6, severe tricuspid regurgitation, and thickened tricuspid valve leaflets.  Heart function appears similar to last study done in June and at that time bubble study noted concern for interatrial shunt. -Strict I&Os and daily weights -Far no evidence of volume overload, no indication for diuresis.   Bacterial vaginosis: -  on PO flagyl   Urinary retention:  -Voiding trial today  Prolonged QT interval: Acute.   -Continue much improved, continue to monitor closely, this morning between 540 and 570 -Monitor electrolytes closely and replete as needed.  Polysubstance abuse IV drug user: Patient reports recent use of IV heroin as well as intermittent use of cocaine. -Will need to continue to counsel on need of cessation of substance use. -Patient was started on methadone for withdrawals, this is adjusted frequently, and electrolyte monitored closely. -Crease her methadone to 60 mg oral daily (she used to be on 130  mg of methadone during recent hospitalization at [facility] few months ago). -Urine drug screen positive for cocaine, opiates, benzodiazepines and amphetamines     DVT prophylaxis: Lovenox Code Status: Full Family Communication: None at bedside Disposition:   Status is:  Inpatient  Remains inpatient appropriate because:IV treatments appropriate due to intensity of illness or inability to take PO  Dispo: The patient is from: Home               Anticipated d/c is to: Home              Patient currently is not medically stable to d/c.   Difficult to place patient Yes       Consultants:  ID   Subjective:  Good bowel movement yesterday, no further hemoptysis, reports she is feeling better, but still having her whole body hurts, still having generalized aching in the pelvic area.    Objective: Vitals:   01/23/21 0229 01/23/21 0400 01/23/21 0511 01/23/21 0907  BP: (!) 131/95 116/85 114/83 (!) 119/92  Pulse: (!) 113 100 97 (!) 109  Resp: (!) 25 (!) 25 (!) 22 20  Temp: 99 F (37.2 C) 97.7 F (36.5 C) 97.8 F (36.6 C) 97.9 F (36.6 C)  TempSrc: Oral Oral Oral Oral  SpO2: 92% 93% 95% 91%  Weight:      Height:        Intake/Output Summary (Last 24 hours) at 01/23/2021 1221 Last data filed at 01/23/2021 1000 Gross per 24 hour  Intake 1573.26 ml  Output 925 ml  Net 648.26 ml   Filed Weights   01/19/21 0014 01/22/21 0534  Weight: 81.6 kg 82 kg    Examination:  Awake Alert, Oriented X 3, No new F.N deficits, Normal affect, chronically ill-appearing, frail Symmetrical Chest wall movement, Good air movement bilaterally, CTAB Cardiac, murmur present +ve B.Sounds, Abd Soft, No tenderness, No rebound - guarding or rigidity. No Cyanosis, Clubbing or edema, No new Rash or bruise     Patient seen and examined with her RN Jackelyn Poling present at bedside.     Data Reviewed: I have personally reviewed following labs and imaging studies  CBC: Recent Labs  Lab 01/19/21 0030 01/20/21 1132 01/21/21 0021 01/22/21 0602 01/23/21 0034  WBC 19.0* 19.2* 24.2* 26.6* 26.1*  NEUTROABS 17.9*  --   --   --   --   HGB 10.5* 9.9* 10.4* 10.7* 10.4*  HCT 33.3* 32.1* 32.6* 33.3* 34.0*  MCV 78.2* 78.9* 77.1* 76.6* 78.9*  PLT 272 231 228 190 992    Basic Metabolic Panel: Recent Labs  Lab 01/19/21 0030 01/20/21 1132 01/21/21 0021 01/22/21 0602 01/23/21 0034  NA 129* 130* 126* 124* 125*  K 3.1* 4.2 4.5 4.1 4.7  CL 90* 93* 92*  93* 94*  CO2 27 26 24 22  20*  GLUCOSE 125* 123* 89 108* 109*  BUN 9 15 11 10 14   CREATININE 0.61 0.71 0.55 0.52 0.57  CALCIUM 8.6* 8.3* 7.9* 7.8* 7.6*  MG  --  1.6* 1.9 1.7 1.8    GFR: Estimated Creatinine Clearance: 107.1 mL/min (by C-G formula based on SCr of 0.57 mg/dL).  Liver Function Tests: Recent Labs  Lab 01/19/21 0030  AST 13*  ALT 9  ALKPHOS 195*  BILITOT 0.6  PROT 6.2*  ALBUMIN 1.8*    CBG: No results for input(s): GLUCAP in the last 168 hours.   Recent Results (from the past 240 hour(s))  Urine Culture     Status: Abnormal   Collection Time: 01/19/21 12:15 AM   Specimen: In/Out Cath Urine  Result Value Ref Range Status   Specimen Description IN/OUT CATH URINE  Final   Special Requests   Final    NONE Performed at [facility]  Lab, [address], [place], [place] [address]    Culture (A)  Final    50,000 COLONIES/mL METHICILLIN RESISTANT STAPHYLOCOCCUS AUREUS   Report Status 01/21/2021 FINAL  Final   Organism ID, Bacteria METHICILLIN RESISTANT STAPHYLOCOCCUS AUREUS (A)  Final      Susceptibility   Methicillin resistant staphylococcus aureus - MIC*    CIPROFLOXACIN >=8 RESISTANT Resistant     GENTAMICIN <=0.5 SENSITIVE Sensitive     NITROFURANTOIN <=16 SENSITIVE Sensitive     OXACILLIN >=4 RESISTANT Resistant     TETRACYCLINE >=16 RESISTANT Resistant     VANCOMYCIN <=0.5 SENSITIVE Sensitive     TRIMETH/SULFA 160 RESISTANT Resistant     CLINDAMYCIN <=0.25 SENSITIVE Sensitive     RIFAMPIN <=0.5 SENSITIVE Sensitive     Inducible Clindamycin NEGATIVE Sensitive     * 50,000 COLONIES/mL METHICILLIN RESISTANT STAPHYLOCOCCUS AUREUS  Resp Panel by RT-PCR (Flu A&B, Covid) Nasopharyngeal Swab     Status: None   Collection Time: 01/19/21 12:21 AM   Specimen: Nasopharyngeal Swab; Nasopharyngeal(NP) swabs in vial transport medium  Result Value Ref Range Status   SARS Coronavirus 2 by RT PCR NEGATIVE NEGATIVE Final    Comment: (NOTE) SARS-CoV-2 target  nucleic acids are NOT DETECTED.  The SARS-CoV-2 RNA is generally detectable in upper respiratory specimens during the acute phase of infection. The lowest concentration of SARS-CoV-2 viral copies this assay can detect is 138 copies/mL. A negative result does not preclude SARS-Cov-2 infection and should not be used as the sole basis for treatment or other patient management decisions. A negative result may occur with  improper specimen collection/handling, submission of specimen other than nasopharyngeal swab, presence of viral mutation(s) within the areas targeted by this assay, and inadequate number of viral copies(<138 copies/mL). A negative result must be combined with clinical observations, patient history, and epidemiological information. The expected result is Negative.  Fact Sheet for Patients:  EntrepreneurPulse.com.au  Fact Sheet for Healthcare Providers:  IncredibleEmployment.be  This test is no t yet approved or cleared by the [place] FDA and  has been authorized for detection and/or diagnosis of SARS-CoV-2 by FDA under an Emergency Use Authorization (EUA). This EUA will remain  in effect (meaning this test can be used) for the duration of the COVID-19 declaration under Section 564(b)(1) of the Act, 21 U.S.C.section 360bbb-3(b)(1), unless the authorization is terminated  or revoked sooner.       Influenza A by PCR NEGATIVE NEGATIVE Final   Influenza B by PCR NEGATIVE NEGATIVE Final    Comment: (NOTE) The Xpert Xpress SARS-CoV-2/FLU/RSV plus assay is intended as an aid in the diagnosis of influenza from Nasopharyngeal swab specimens and should not be used as a sole basis for treatment. Nasal washings and aspirates are unacceptable for Xpert Xpress SARS-CoV-2/FLU/RSV testing.  Fact Sheet for Patients: EntrepreneurPulse.com.au  Fact Sheet for Healthcare  Providers: IncredibleEmployment.be  This test is not yet approved or cleared by the [place] FDA and has been authorized for detection and/or diagnosis of SARS-CoV-2 by FDA under an Emergency Use Authorization (EUA). This EUA will remain in effect (meaning this test can be used) for the duration of the COVID-19 declaration under Section 564(b)(1) of the Act, 21 U.S.C. section 360bbb-3(b)(1), unless the authorization is terminated or revoked.  Performed at [facility], [place] [address], [place], [place] [address]   Blood Culture (routine x 2)     Status: Abnormal   Collection Time: 01/19/21 12:30 AM   Specimen: BLOOD RIGHT HAND  Result Value Ref Range Status   Specimen Description BLOOD RIGHT HAND  Final   Special Requests   Final    BOTTLES DRAWN AEROBIC AND ANAEROBIC Blood Culture results may not be optimal due to an inadequate volume of blood received in culture bottles   Culture  Setup Time   Final    GRAM POSITIVE COCCI IN CLUSTERS IN BOTH AEROBIC AND ANAEROBIC BOTTLES CRITICAL RESULT CALLED TO, READ BACK BY AND VERIFIED WITH: PHARMD ALEX L [id number] FCP    Culture (A)  Final    STAPHYLOCOCCUS AUREUS SUSCEPTIBILITIES PERFORMED ON PREVIOUS CULTURE WITHIN THE LAST 5 DAYS. Performed at [facility], [place] [address] [place], [place] [address]    Report Status 01/21/2021 FINAL  Final  Blood Culture ID Panel (Reflexed)     Status: Abnormal   Collection Time: 01/19/21 12:30 AM  Result Value Ref Range Status   Enterococcus faecalis NOT DETECTED NOT DETECTED Final   Enterococcus Faecium NOT DETECTED NOT DETECTED Final   Listeria monocytogenes NOT DETECTED NOT DETECTED Final   Staphylococcus species DETECTED (A) NOT DETECTED Final    Comment: CRITICAL RESULT CALLED TO, READ BACK BY AND VERIFIED WITH: PHARMD ALEX L [id number] [id number] FCP    Staphylococcus aureus (BCID) DETECTED (A) NOT DETECTED Final    Comment: Methicillin (oxacillin)-resistant  Staphylococcus aureus (MRSA). MRSA is predictably resistant to beta-lactam antibiotics (except ceftaroline). Preferred therapy is vancomycin unless clinically contraindicated. Patient requires contact precautions if  hospitalized. CRITICAL RESULT CALLED TO, READ BACK BY AND VERIFIED WITH: PHARMD ALEX L 1332 [id number] FCP    Staphylococcus epidermidis NOT DETECTED NOT DETECTED Final   Staphylococcus lugdunensis NOT DETECTED NOT DETECTED Final   Streptococcus species NOT DETECTED NOT DETECTED Final   Streptococcus agalactiae NOT DETECTED NOT DETECTED Final   Streptococcus pneumoniae NOT DETECTED NOT DETECTED Final   Streptococcus pyogenes NOT DETECTED NOT DETECTED Final   A.calcoaceticus-baumannii NOT DETECTED NOT DETECTED Final   Bacteroides fragilis NOT DETECTED NOT DETECTED Final   Enterobacterales NOT DETECTED NOT DETECTED Final   Enterobacter cloacae complex NOT DETECTED NOT DETECTED Final   Escherichia coli NOT DETECTED NOT DETECTED Final   Klebsiella aerogenes NOT DETECTED NOT DETECTED Final   Klebsiella oxytoca NOT DETECTED NOT DETECTED Final   Klebsiella pneumoniae NOT DETECTED NOT DETECTED Final   Proteus species NOT DETECTED NOT DETECTED Final   Salmonella species NOT DETECTED NOT DETECTED Final   Serratia marcescens NOT DETECTED NOT DETECTED Final   Haemophilus influenzae NOT DETECTED NOT DETECTED Final   Neisseria meningitidis NOT DETECTED NOT DETECTED Final   Pseudomonas aeruginosa NOT DETECTED NOT DETECTED Final   Stenotrophomonas maltophilia NOT DETECTED NOT DETECTED Final   Candida albicans NOT DETECTED NOT DETECTED Final   Candida auris NOT DETECTED NOT DETECTED Final   Candida glabrata NOT DETECTED NOT DETECTED Final   Candida krusei NOT DETECTED NOT DETECTED Final   Candida parapsilosis NOT DETECTED NOT DETECTED Final   Candida tropicalis NOT DETECTED NOT DETECTED Final   Cryptococcus neoformans/gattii NOT DETECTED NOT DETECTED Final   Meth resistant mecA/C and MREJ  DETECTED (A) NOT DETECTED Final    Comment: CRITICAL RESULT CALLED TO, READ BACK BY AND VERIFIED WITH: PHARMD ALEX L [phone] [id number] FCP Performed at [facility], [address], [place], [place] [address]   Blood Culture (routine x 2)     Status: Abnormal   Collection Time: 01/19/21 12:34 AM   Specimen: BLOOD LEFT WRIST  Result Value Ref Range  Status   Specimen Description BLOOD LEFT WRIST  Final   Special Requests   Final    BOTTLES DRAWN AEROBIC AND ANAEROBIC Blood Culture results may not be optimal due to an inadequate volume of blood received in culture bottles   Culture  Setup Time   Final    GRAM POSITIVE COCCI IN BOTH AEROBIC AND ANAEROBIC BOTTLES Performed at [facility], [place] [address] [place], [place] [address]    Culture METHICILLIN RESISTANT STAPHYLOCOCCUS AUREUS (A)  Final   Report Status 01/21/2021 FINAL  Final   Organism ID, Bacteria METHICILLIN RESISTANT STAPHYLOCOCCUS AUREUS  Final      Susceptibility   Methicillin resistant staphylococcus aureus - MIC*    CIPROFLOXACIN >=8 RESISTANT Resistant     ERYTHROMYCIN >=8 RESISTANT Resistant     GENTAMICIN <=0.5 SENSITIVE Sensitive     OXACILLIN >=4 RESISTANT Resistant     TETRACYCLINE >=16 RESISTANT Resistant     VANCOMYCIN 1 SENSITIVE Sensitive     TRIMETH/SULFA >=320 RESISTANT Resistant     CLINDAMYCIN <=0.25 SENSITIVE Sensitive     RIFAMPIN <=0.5 SENSITIVE Sensitive     Inducible Clindamycin NEGATIVE Sensitive     * METHICILLIN RESISTANT STAPHYLOCOCCUS AUREUS  Wet prep, genital     Status: Abnormal   Collection Time: 01/19/21  5:20 AM   Specimen: PATH Cytology Cervicovaginal Ancillary Only  Result Value Ref Range Status   Yeast Wet Prep HPF POC NONE SEEN NONE SEEN Final   Trich, Wet Prep NONE SEEN NONE SEEN Final   Clue Cells Wet Prep HPF POC PRESENT (A) NONE SEEN Final   WBC, Wet Prep HPF POC MANY (A) NONE SEEN Final   Sperm NONE SEEN  Final    Comment: Performed at [facility], [place]  [address] [place], [place] [address]         Radiology Studies: No results found.      Scheduled Meds:  Chlorhexidine Gluconate Cloth  6 each Topical Q0600   enoxaparin (LOVENOX) injection  40 mg Subcutaneous Q24H   magnesium hydroxide  30 mL Oral Daily   methadone  10 mg Oral Once   [START ON 01/24/2021] methadone  60 mg Oral Daily   metroNIDAZOLE  500 mg Oral Q12H   senna-docusate  1 tablet Oral BID   sodium chloride  2 g Oral BID WC   Continuous Infusions:  sodium chloride 10 mL/hr at 01/23/21 1000   vancomycin 1,250 mg (01/23/21 1119)     LOS: 4 days        Phillips Climes, MD [facility]   To contact the attending provider between 7A-7P or the covering provider during after hours 7P-7A, please log into the web site [email or web address] and access using universal [place] password for that web site. If you do not have the password, please call the hospital operator.  01/23/2021, 12:21 PM

## 2021-01-23 NOTE — Progress Notes (Signed)
Physical Therapy Treatment Patient Details Name: Kelly [name] MRN: [id number] DOB: 14-May-1986 Today's Date: 01/23/2021   History of Present Illness 35 y.o. female presents to [facility] hospital on 01/19/2021 with pelvic pain. Pt found to have sepsis 2/2 MRSA bacteremia. PMH includes HTN, systolic CHF with moderate pulmonary hypertension and severe TR, polysubstance abuse including history of IV drug use, recurrent history of endocarditis, C. difficile colitis.    PT Comments    Pt received in bed. With encouragement, pt agreeable to transfer OOB to recliner. She required mod assist supine to sit, mod assist sit to stand with RW, and min assist SPT with RW bed to recliner. Pt required increased time and continual verbal cues to complete all mobility skills.  At end of session, pt in recliner, feet elevated, with multiple pillows for positioning.    Recommendations for follow up therapy are one component of a multi-disciplinary discharge planning process, led by the attending physician.  Recommendations may be updated based on patient status, additional functional criteria and insurance authorization.  Follow Up Recommendations  Home health PT     Equipment Recommendations  Wheelchair (measurements PT)    Recommendations for Other Services       Precautions / Restrictions Precautions Precautions: Fall     Mobility  Bed Mobility Overal bed mobility: Needs Assistance Bed Mobility: Supine to Sit     Supine to sit: Mod assist;HOB elevated     General bed mobility comments: +rail, cues for sequencing, increased time, assist with BLE and trunk    Transfers Overall transfer level: Needs assistance Equipment used: Rolling walker (2 wheeled) Transfers: Sit to/from [facility] Sit to Stand: Mod assist Stand pivot transfers: Min assist       General transfer comment: cues for hand placement and sequencing, mod assist to power up, increased time  Ambulation/Gait              General Gait Details: unable due to pain   Stairs             Wheelchair Mobility    Modified Rankin (Stroke Patients Only)       Balance Overall balance assessment: Needs assistance Sitting-balance support: Feet supported;Single extremity supported;Bilateral upper extremity supported Sitting balance-Leahy Scale: Fair     Standing balance support: Bilateral upper extremity supported;During functional activity Standing balance-Leahy Scale: Poor Standing balance comment: reliant on BUE support                            Cognition Arousal/Alertness: Awake/alert Behavior During Therapy: Anxious Overall Cognitive Status: Within Functional Limits for tasks assessed                                        Exercises      General Comments General comments (skin integrity, edema, etc.): SpO2 in 90s on 3L. Max HR in 120s      Pertinent Vitals/Pain Pain Assessment: Faces Faces Pain Scale: Hurts worst Pain Location: pelvis and LEs Pain Descriptors / Indicators: Cramping;Tightness;Moaning;Grimacing;Guarding Pain Intervention(s): Monitored during session;Limited activity within patient's tolerance;Repositioned;Premedicated before session    Home Living                      Prior Function            PT Goals (current goals can now be found in  the care plan section) Acute Rehab PT Goals Patient Stated Goal: to reduce pain Progress towards PT goals: Progressing toward goals    Frequency    Min 3X/week      PT Plan Current plan remains appropriate    Co-evaluation              AM-PAC PT "6 Clicks" Mobility   Outcome Measure  Help needed turning from your back to your side while in a flat bed without using bedrails?: A Little Help needed moving from lying on your back to sitting on the side of a flat bed without using bedrails?: A Lot Help needed moving to and from a bed to a chair (including a  wheelchair)?: A Lot Help needed standing up from a chair using your arms (e.g., wheelchair or bedside chair)?: A Lot Help needed to walk in hospital room?: Total Help needed climbing 3-5 steps with a railing? : Total 6 Click Score: 11    End of Session Equipment Utilized During Treatment: Oxygen;Gait belt Activity Tolerance: Patient limited by pain Patient left: in chair;with call bell/phone within reach Nurse Communication: Mobility status PT Visit Diagnosis: Unsteadiness on feet (R26.81);Other abnormalities of gait and mobility (R26.89);Muscle weakness (generalized) (M62.81);Pain     Time: 9629-5284 PT Time Calculation (min) (ACUTE ONLY): 31 min  Charges:  $Therapeutic Activity: 23-37 mins                     Lorrin Goodell, [place]  Office # [phone] Pager [phone]    Lorriane Shire 01/23/2021, 12:12 PM

## 2021-01-23 NOTE — Progress Notes (Addendum)
Pharmacy Antibiotic Note  Kelly [name] is a 35 y.o. female admitted on 01/19/2021 with  MRSA bacteremia with r/o endocarditis .  Pharmacy has been consulted for Vanco dosing.  ID: Sepsis with MRSA bacteremia/septic pulmonary emboli, with possible tricuspid valve endocarditis secondary to IV drug use  - WBC remains elevated at 26.1. Tmax 100.4. Scr <1  Vancomycin 9/8> Cefepime 9/8>9/8 Metronidazole (BV) 9/8>(9/14)  9//8 BC x 2- > MRSA 9/8: UCx: MRSA, 50,000 colonies.  9/12 0034 VP 39 9/12 1037 VT 6 Steady-state AUC 460.4 is within goal range of 400-550.  Plan: Continue vancomycin 1250 mg IV every 12 hours. Continue metronidazole 500 mg every 12 hours. Check vancomycin levels weekly unless clinical changes in patient indicate otherwise.   Height: 5' 7"  (170.2 cm) Weight: 82 kg (180 lb 12.4 oz) IBW/kg (Calculated) : 61.6  Temp (24hrs), Avg:98.6 F (37 C), Min:97.7 F (36.5 C), Max:100.4 F (38 C)  Recent Labs  Lab 01/19/21 0015 01/19/21 0030 01/19/21 0530 01/19/21 0918 01/20/21 0253 01/20/21 1132 01/21/21 0021 01/22/21 0602 01/23/21 0034 01/23/21 1037  WBC  --  19.0*  --   --   --  19.2* 24.2* 26.6* 26.1*  --   CREATININE  --  0.61  --   --   --  0.71 0.55 0.52 0.57  --   LATICACIDVEN 2.6*  --  2.4* 1.0 1.2  --   --   --   --   --   VANCOTROUGH  --   --   --   --   --   --   --   --   --  6*  VANCOPEAK  --   --   --   --   --   --   --   --  39  --      Estimated Creatinine Clearance: 107.1 mL/min (by C-G formula based on SCr of 0.57 mg/dL).    Allergies  Allergen Reactions   Hydrocodone Itching   Morphine And Related Nausea And Vomiting    Collene Gobble, Student Pharmacist

## 2021-01-24 ENCOUNTER — Inpatient Hospital Stay (HOSPITAL_COMMUNITY): Payer: Medicaid Other

## 2021-01-24 DIAGNOSIS — B9562 Methicillin resistant Staphylococcus aureus infection as the cause of diseases classified elsewhere: Secondary | ICD-10-CM | POA: Diagnosis not present

## 2021-01-24 DIAGNOSIS — E871 Hypo-osmolality and hyponatremia: Secondary | ICD-10-CM | POA: Diagnosis not present

## 2021-01-24 DIAGNOSIS — R7881 Bacteremia: Secondary | ICD-10-CM | POA: Diagnosis not present

## 2021-01-24 DIAGNOSIS — F1990 Other psychoactive substance use, unspecified, uncomplicated: Secondary | ICD-10-CM | POA: Diagnosis not present

## 2021-01-24 LAB — BASIC METABOLIC PANEL
Anion gap: 9 (ref 5–15)
BUN: 12 mg/dL (ref 6–20)
CO2: 23 mmol/L (ref 22–32)
Calcium: 8 mg/dL — ABNORMAL LOW (ref 8.9–10.3)
Chloride: 93 mmol/L — ABNORMAL LOW (ref 98–111)
Creatinine, Ser: 0.52 mg/dL (ref 0.44–1.00)
GFR, Estimated: >60 mL/min (ref >60)
Glucose, Bld: 91 mg/dL (ref 70–99)
Potassium: 4.2 mmol/L (ref 3.5–5.1)
Sodium: 125 mmol/L — ABNORMAL LOW (ref 135–145)

## 2021-01-24 LAB — CBC
HCT: 32.3 % — ABNORMAL LOW (ref 36.0–46.0)
Hemoglobin: 9.8 g/dL — ABNORMAL LOW (ref 12.0–15.0)
MCH: 24 pg — ABNORMAL LOW (ref 26.0–34.0)
MCHC: 30.3 g/dL (ref 30.0–36.0)
MCV: 79 fL — ABNORMAL LOW (ref 80.0–100.0)
Platelets: 213 10*3/uL (ref 150–400)
RBC: 4.09 MIL/uL (ref 3.87–5.11)
RDW: 18.6 % — ABNORMAL HIGH (ref 11.5–15.5)
WBC: 22.9 10*3/uL — ABNORMAL HIGH (ref 4.0–10.5)
nRBC: 0 % (ref 0.0–0.2)

## 2021-01-24 LAB — MAGNESIUM
Magnesium: 1.7 mg/dL (ref 1.7–2.4)
Magnesium: 1.7 mg/dL (ref 1.7–2.4)

## 2021-01-24 MED ORDER — KETOROLAC TROMETHAMINE 30 MG/ML IJ SOLN
30.0000 mg | Freq: Once | INTRAMUSCULAR | Status: AC
  Administered 2021-01-24: 30 mg via INTRAVENOUS
  Filled 2021-01-24: qty 1

## 2021-01-24 MED ORDER — GADOBUTROL 1 MMOL/ML IV SOLN
8.5000 mL | Freq: Once | INTRAVENOUS | Status: AC | PRN
  Administered 2021-01-24: 8.5 mL via INTRAVENOUS

## 2021-01-24 MED ORDER — LORAZEPAM 2 MG/ML IJ SOLN
1.0000 mg | Freq: Once | INTRAMUSCULAR | Status: AC | PRN
  Administered 2021-01-24: 1 mg via INTRAVENOUS
  Filled 2021-01-24: qty 1

## 2021-01-24 NOTE — Plan of Care (Signed)

## 2021-01-24 NOTE — Progress Notes (Addendum)
I&O cath performed at 0525 d/t bladder scan volume of 719m. 100476mof clear, amber urine returned.   Patient c/o of severe pelvic pain that radiates to bilateral legs that has progressively worsened throughout the night. 76m36mV dilaudid given Q3H throughout the night. MD notified and x1 dose of toradol ordered.

## 2021-01-24 NOTE — Progress Notes (Signed)
PROGRESS NOTE    Kelly [name]  KKX:[id number] DOB: 02-27-1986 DOA: 01/19/2021 PCP: Pcp, No   Chief Complaint  Patient presents with   Abdominal Pain    Brief Narrative:   Kelly [name] is a 34 y.o. female with medical history significant of HTN, systolic CHF with moderate pulmonary hypertension and severe TR, polysubstance abuse including history of IV drug use, recurrent history of endocarditis, C. difficile colitis complaints of 3 to 4-day history of pelvic pain. Records note patient was last hospitalized from 6/15-6/24 for multiple abscesses status post I&D with cultures positive for staph aureus treated on IV vancomycin and Unasyn.  Blood cultures were noted to be negative.  Repeat TEE noted possible healed tricuspid vegetation with positive bubble study suggesting PFO with interatrial shunt.  Patient ultimately was discharged to complete course of Zyvox and amoxicillin.  Unclear if patient actually did this. -Patient reports she used IV heroin and cocaine prior to admission, significant for MRSA bacteremia.   Assessment & Plan:   Principal Problem:   MRSA bacteremia Active Problems:   Back pain   Polysubstance abuse (HCC)   IVDU (intravenous drug user)   Hyponatremia   Endocarditis of tricuspid valve   Severe tricuspid regurgitation   Prolonged QT interval   Sepsis (HCC)   Constipation   Sepsis secondary MRSA bacteremia/septic pulmonary emboli -Sepsis secondary to MRSA bacteremia, with possible tricuspid valve endocarditis secondary to IV drug use  - ID input greatly appreciated, continue with IV vancomycin for MRSA bacteremia,  - continue with p.o. Flagyl for BV  -Management per ID, at this point TEE will not change management as she is not a surgical candidate, and she will need prolonged antibiotic therapy, so it was canceled. -Pelvic MRI is pending given soft pain in pelvic area. -Follow on blood cultures today, no PICC line yet.  Acute metabolic  encephalopathy:  -Currently resolved, most likely due to polysubstance abuse, mentation back to baseline.  Hyponatremia -her sodium is on the lower side chronically, but not as much. -This appears to be SIADH osmolality of 258, urine osmolality of 519 with urine sodium of 50. -Continue with fluid restrictions .  Sodium improved from 1 24-1 25 today.  Chronic systolic CHF severe tricuspid regurgitation pulmonary hypertension: -   Echocardiogram  noted EF to be 40 to 45% with pulmonary artery hypertension with RVSP 68.6, severe tricuspid regurgitation, and thickened tricuspid valve leaflets.  Heart function appears similar to last study done in June and at that time bubble study noted concern for interatrial shunt. -Strict I&Os and daily weights -Far no evidence of volume overload, no indication for diuresis.   Bacterial vaginosis: -  on PO flagyl   Urinary retention:  -Catheter discontinued 9/12, so far seems recurrent retention, insert Foley catheter if next bladder scan still showing retention.  Prolonged QT interval: Acute.   -Continue much improved, continue to monitor closely, this morning between 540 and 570 -Monitor electrolytes closely and replete as needed.  Polysubstance abuse IV drug user: Patient reports recent use of IV heroin as well as intermittent use of cocaine. -Will need to continue to counsel on need of cessation of substance use. -Patient was started on methadone for withdrawals, this is adjusted frequently, and electrolyte monitored closely. -She was started on methadone during hospital stay, it was gradually increased, she is currently on 60 mg oral daily  (she used to be on 130  mg of methadone during recent hospitalization at [facility] few months ago). -Urine  drug screen positive for cocaine, opiates, benzodiazepines and amphetamines     DVT prophylaxis: Lovenox Code Status: Full Family Communication: None at bedside Disposition:   Status is:  Inpatient  Remains inpatient appropriate because:IV treatments appropriate due to intensity of illness or inability to take PO  Dispo: The patient is from: Home              Anticipated d/c is to: Home              Patient currently is not medically stable to d/c.   Difficult to place patient Yes       Consultants:  ID   Subjective:  Of generalized body ache, but mainly in the pelvic area, T-max 100 overnight.  Ports she is compliant with out of bed to chair and incentive spirometer.    Objective: Vitals:   01/23/21 1708 01/23/21 2032 01/23/21 2328 01/24/21 0809  BP: 120/85 122/89 108/74 113/72  Pulse: (!) 104 (!) 112 (!) 102 (!) 102  Resp: 20 20 19 20   Temp: 98.3 F (36.8 C) 100 F (37.8 C) 98.5 F (36.9 C) 98.1 F (36.7 C)  TempSrc: Oral Oral Oral Oral  SpO2: 91% 94% 93% 92%  Weight:      Height:        Intake/Output Summary (Last 24 hours) at 01/24/2021 1239 Last data filed at 01/24/2021 0525 Gross per 24 hour  Intake 240 ml  Output 1750 ml  Net -1510 ml   Filed Weights   01/19/21 0014 01/22/21 0534  Weight: 81.6 kg 82 kg    Examination:  Awake Alert, Oriented X 3, chronically ill-appearing. Symmetrical Chest wall movement, Good air movement bilaterally, CTAB RRR,No Gallops, murmur present  +ve B.Sounds, Abd Soft, No tenderness. No Cyanosis, Clubbing or edema, No new Rash or bruise    Patient seen and examined with female chaperone  CNA [place]: I have personally reviewed following labs and imaging studies  CBC: Recent Labs  Lab 01/19/21 0030 01/20/21 1132 01/21/21 0021 01/22/21 0602 01/23/21 0034 01/24/21 0653  WBC 19.0* 19.2* 24.2* 26.6* 26.1* 22.9*  NEUTROABS 17.9*  --   --   --   --   --   HGB 10.5* 9.9* 10.4* 10.7* 10.4* 9.8*  HCT 33.3* 32.1* 32.6* 33.3* 34.0* 32.3*  MCV 78.2* 78.9* 77.1* 76.6* 78.9* 79.0*  PLT 272 231 228 190 189 409    Basic Metabolic Panel: Recent Labs  Lab 01/20/21 1132 01/21/21 0021  01/22/21 0602 01/23/21 0034 01/24/21 0327 01/24/21 0653  NA 130* 126* 124* 125*  --  125*  K 4.2 4.5 4.1 4.7  --  4.2  CL 93* 92* 93* 94*  --  93*  CO2 26 24 22  20*  --  23  GLUCOSE 123* 89 108* 109*  --  91  BUN 15 11 10 14   --  12  CREATININE 0.71 0.55 0.52 0.57  --  0.52  CALCIUM 8.3* 7.9* 7.8* 7.6*  --  8.0*  MG 1.6* 1.9 1.7 1.8 1.7 1.7    GFR: Estimated Creatinine Clearance: 107.1 mL/min (by C-G formula based on SCr of 0.52 mg/dL).  Liver Function Tests: Recent Labs  Lab 01/19/21 0030  AST 13*  ALT 9  ALKPHOS 195*  BILITOT 0.6  PROT 6.2*  ALBUMIN 1.8*    CBG: No results for input(s): GLUCAP in the last 168 hours.   Recent Results (from the past 240 hour(s))  Urine Culture  Status: Abnormal   Collection Time: 01/19/21 12:15 AM   Specimen: In/Out Cath Urine  Result Value Ref Range Status   Specimen Description IN/OUT CATH URINE  Final   Special Requests   Final    NONE Performed at [facility], [address] [place], [place] [address]    Culture (A)  Final    50,000 COLONIES/mL METHICILLIN RESISTANT STAPHYLOCOCCUS AUREUS   Report Status 01/21/2021 FINAL  Final   Organism ID, Bacteria METHICILLIN RESISTANT STAPHYLOCOCCUS AUREUS (A)  Final      Susceptibility   Methicillin resistant staphylococcus aureus - MIC*    CIPROFLOXACIN >=8 RESISTANT Resistant     GENTAMICIN <=0.5 SENSITIVE Sensitive     NITROFURANTOIN <=16 SENSITIVE Sensitive     OXACILLIN >=4 RESISTANT Resistant     TETRACYCLINE >=16 RESISTANT Resistant     VANCOMYCIN <=0.5 SENSITIVE Sensitive     TRIMETH/SULFA 160 RESISTANT Resistant     CLINDAMYCIN <=0.25 SENSITIVE Sensitive     RIFAMPIN <=0.5 SENSITIVE Sensitive     Inducible Clindamycin NEGATIVE Sensitive     * 50,000 COLONIES/mL METHICILLIN RESISTANT STAPHYLOCOCCUS AUREUS  Resp Panel by RT-PCR (Flu A&B, Covid) Nasopharyngeal Swab     Status: None   Collection Time: 01/19/21 12:21 AM   Specimen: Nasopharyngeal Swab;  Nasopharyngeal(NP) swabs in vial transport medium  Result Value Ref Range Status   SARS Coronavirus 2 by RT PCR NEGATIVE NEGATIVE Final    Comment: (NOTE) SARS-CoV-2 target nucleic acids are NOT DETECTED.  The SARS-CoV-2 RNA is generally detectable in upper respiratory specimens during the acute phase of infection. The lowest concentration of SARS-CoV-2 viral copies this assay can detect is 138 copies/mL. A negative result does not preclude SARS-Cov-2 infection and should not be used as the sole basis for treatment or other patient management decisions. A negative result may occur with  improper specimen collection/handling, submission of specimen other than nasopharyngeal swab, presence of viral mutation(s) within the areas targeted by this assay, and inadequate number of viral copies(<138 copies/mL). A negative result must be combined with clinical observations, patient history, and epidemiological information. The expected result is Negative.  Fact Sheet for Patients:  EntrepreneurPulse.com.au  Fact Sheet for Healthcare Providers:  IncredibleEmployment.be  This test is no t yet approved or cleared by the [place] FDA and  has been authorized for detection and/or diagnosis of SARS-CoV-2 by FDA under an Emergency Use Authorization (EUA). This EUA will remain  in effect (meaning this test can be used) for the duration of the COVID-19 declaration under Section 564(b)(1) of the Act, 21 U.S.C.section 360bbb-3(b)(1), unless the authorization is terminated  or revoked sooner.       Influenza A by PCR NEGATIVE NEGATIVE Final   Influenza B by PCR NEGATIVE NEGATIVE Final    Comment: (NOTE) The Xpert Xpress SARS-CoV-2/FLU/RSV plus assay is intended as an aid in the diagnosis of influenza from Nasopharyngeal swab specimens and should not be used as a sole basis for treatment. Nasal washings and aspirates are unacceptable for Xpert Xpress  SARS-CoV-2/FLU/RSV testing.  Fact Sheet for Patients: EntrepreneurPulse.com.au  Fact Sheet for Healthcare Providers: IncredibleEmployment.be  This test is not yet approved or cleared by the [place] FDA and has been authorized for detection and/or diagnosis of SARS-CoV-2 by FDA under an Emergency Use Authorization (EUA). This EUA will remain in effect (meaning this test can be used) for the duration of the COVID-19 declaration under Section 564(b)(1) of the Act, 21 U.S.C. section 360bbb-3(b)(1), unless the  authorization is terminated or revoked.  Performed at [facility], [place] [address], [place], [place] [address]   Blood Culture (routine x 2)     Status: Abnormal   Collection Time: 01/19/21 12:30 AM   Specimen: BLOOD RIGHT HAND  Result Value Ref Range Status   Specimen Description BLOOD RIGHT HAND  Final   Special Requests   Final    BOTTLES DRAWN AEROBIC AND ANAEROBIC Blood Culture results may not be optimal due to an inadequate volume of blood received in culture bottles   Culture  Setup Time   Final    GRAM POSITIVE COCCI IN CLUSTERS IN BOTH AEROBIC AND ANAEROBIC BOTTLES CRITICAL RESULT CALLED TO, READ BACK BY AND VERIFIED WITH: PHARMD ALEX L 1332 [id number] FCP    Culture (A)  Final    STAPHYLOCOCCUS AUREUS SUSCEPTIBILITIES PERFORMED ON PREVIOUS CULTURE WITHIN THE LAST 5 DAYS. Performed at [facility], [place] [address], [place], [place] [address]    Report Status 01/21/2021 FINAL  Final  Blood Culture ID Panel (Reflexed)     Status: Abnormal   Collection Time: 01/19/21 12:30 AM  Result Value Ref Range Status   Enterococcus faecalis NOT DETECTED NOT DETECTED Final   Enterococcus Faecium NOT DETECTED NOT DETECTED Final   Listeria monocytogenes NOT DETECTED NOT DETECTED Final   Staphylococcus species DETECTED (A) NOT DETECTED Final    Comment: CRITICAL RESULT CALLED TO, READ BACK BY AND VERIFIED WITH: PHARMD ALEX L 1332  [id number] FCP    Staphylococcus aureus (BCID) DETECTED (A) NOT DETECTED Final    Comment: Methicillin (oxacillin)-resistant Staphylococcus aureus (MRSA). MRSA is predictably resistant to beta-lactam antibiotics (except ceftaroline). Preferred therapy is vancomycin unless clinically contraindicated. Patient requires contact precautions if  hospitalized. CRITICAL RESULT CALLED TO, READ BACK BY AND VERIFIED WITH: PHARMD ALEX L 1332 [id number] FCP    Staphylococcus epidermidis NOT DETECTED NOT DETECTED Final   Staphylococcus lugdunensis NOT DETECTED NOT DETECTED Final   Streptococcus species NOT DETECTED NOT DETECTED Final   Streptococcus agalactiae NOT DETECTED NOT DETECTED Final   Streptococcus pneumoniae NOT DETECTED NOT DETECTED Final   Streptococcus pyogenes NOT DETECTED NOT DETECTED Final   A.calcoaceticus-baumannii NOT DETECTED NOT DETECTED Final   Bacteroides fragilis NOT DETECTED NOT DETECTED Final   Enterobacterales NOT DETECTED NOT DETECTED Final   Enterobacter cloacae complex NOT DETECTED NOT DETECTED Final   Escherichia coli NOT DETECTED NOT DETECTED Final   Klebsiella aerogenes NOT DETECTED NOT DETECTED Final   Klebsiella oxytoca NOT DETECTED NOT DETECTED Final   Klebsiella pneumoniae NOT DETECTED NOT DETECTED Final   Proteus species NOT DETECTED NOT DETECTED Final   Salmonella species NOT DETECTED NOT DETECTED Final   Serratia marcescens NOT DETECTED NOT DETECTED Final   Haemophilus influenzae NOT DETECTED NOT DETECTED Final   Neisseria meningitidis NOT DETECTED NOT DETECTED Final   Pseudomonas aeruginosa NOT DETECTED NOT DETECTED Final   Stenotrophomonas maltophilia NOT DETECTED NOT DETECTED Final   Candida albicans NOT DETECTED NOT DETECTED Final   Candida auris NOT DETECTED NOT DETECTED Final   Candida glabrata NOT DETECTED NOT DETECTED Final   Candida krusei NOT DETECTED NOT DETECTED Final   Candida parapsilosis NOT DETECTED NOT DETECTED Final   Candida tropicalis NOT  DETECTED NOT DETECTED Final   Cryptococcus neoformans/gattii NOT DETECTED NOT DETECTED Final   Meth resistant mecA/C and MREJ DETECTED (A) NOT DETECTED Final    Comment: CRITICAL RESULT CALLED TO, READ BACK BY AND VERIFIED WITH: PHARMD ALEX L 1332  [id number] FCP Performed at [facility] [address] [place], [place] [address]   Blood Culture (routine x 2)     Status: Abnormal   Collection Time: 01/19/21 12:34 AM   Specimen: BLOOD LEFT WRIST  Result Value Ref Range Status   Specimen Description BLOOD LEFT WRIST  Final   Special Requests   Final    BOTTLES DRAWN AEROBIC AND ANAEROBIC Blood Culture results may not be optimal due to an inadequate volume of blood received in culture bottles   Culture  Setup Time   Final    GRAM POSITIVE COCCI IN BOTH AEROBIC AND ANAEROBIC BOTTLES Performed at [facility], [place] [address], [place], [place] [address]    Culture METHICILLIN RESISTANT STAPHYLOCOCCUS AUREUS (A)  Final   Report Status 01/21/2021 FINAL  Final   Organism ID, Bacteria METHICILLIN RESISTANT STAPHYLOCOCCUS AUREUS  Final      Susceptibility   Methicillin resistant staphylococcus aureus - MIC*    CIPROFLOXACIN >=8 RESISTANT Resistant     ERYTHROMYCIN >=8 RESISTANT Resistant     GENTAMICIN <=0.5 SENSITIVE Sensitive     OXACILLIN >=4 RESISTANT Resistant     TETRACYCLINE >=16 RESISTANT Resistant     VANCOMYCIN 1 SENSITIVE Sensitive     TRIMETH/SULFA >=320 RESISTANT Resistant     CLINDAMYCIN <=0.25 SENSITIVE Sensitive     RIFAMPIN <=0.5 SENSITIVE Sensitive     Inducible Clindamycin NEGATIVE Sensitive     * METHICILLIN RESISTANT STAPHYLOCOCCUS AUREUS  Wet prep, genital     Status: Abnormal   Collection Time: 01/19/21  5:20 AM   Specimen: PATH Cytology Cervicovaginal Ancillary Only  Result Value Ref Range Status   Yeast Wet Prep HPF POC NONE SEEN NONE SEEN Final   Trich, Wet Prep NONE SEEN NONE SEEN Final   Clue Cells Wet Prep HPF POC PRESENT (A) NONE SEEN Final   WBC,  Wet Prep HPF POC MANY (A) NONE SEEN Final   Sperm NONE SEEN  Final    Comment: Performed at [facility], [place] [address], [place], [place] [address]  Culture, blood (Routine X 2) w Reflex to ID Panel     Status: None (Preliminary result)   Collection Time: 01/23/21  1:00 PM   Specimen: BLOOD RIGHT HAND  Result Value Ref Range Status   Specimen Description BLOOD RIGHT HAND  Final   Special Requests IN PEDIATRIC BOTTLE Blood Culture adequate volume  Final   Culture  Setup Time   Final    GRAM POSITIVE COCCI AEROBIC BOTTLE ONLY CRITICAL VALUE NOTED.  VALUE IS CONSISTENT WITH PREVIOUSLY REPORTED AND CALLED VALUE.    Culture   Final    NO GROWTH < 24 HOURS Performed at [facility], [place] [address] [place], [place] [address]    Report Status PENDING  Incomplete         Radiology Studies: No results found.      Scheduled Meds:  Chlorhexidine Gluconate Cloth  6 each Topical Q0600   enoxaparin (LOVENOX) injection  40 mg Subcutaneous Q24H   methadone  60 mg Oral Daily   metroNIDAZOLE  500 mg Oral Q12H   senna-docusate  1 tablet Oral BID   Continuous Infusions:  sodium chloride 10 mL/hr at 01/23/21 1000   vancomycin 1,250 mg (01/24/21 1057)     LOS: 5 days        Phillips Climes, MD [facility]   To contact the attending provider between 7A-7P or the covering  provider during after hours 7P-7A, please log into the web site [email or web address] and access using universal [place] password for that web site. If you do not have the password, please call the hospital operator.  01/24/2021, 12:39 PM

## 2021-01-25 DIAGNOSIS — B9562 Methicillin resistant Staphylococcus aureus infection as the cause of diseases classified elsewhere: Secondary | ICD-10-CM | POA: Diagnosis not present

## 2021-01-25 DIAGNOSIS — R7881 Bacteremia: Secondary | ICD-10-CM | POA: Diagnosis not present

## 2021-01-25 DIAGNOSIS — F1990 Other psychoactive substance use, unspecified, uncomplicated: Secondary | ICD-10-CM | POA: Diagnosis not present

## 2021-01-25 DIAGNOSIS — I079 Rheumatic tricuspid valve disease, unspecified: Secondary | ICD-10-CM | POA: Diagnosis not present

## 2021-01-25 DIAGNOSIS — F1910 Other psychoactive substance abuse, uncomplicated: Secondary | ICD-10-CM | POA: Diagnosis not present

## 2021-01-25 DIAGNOSIS — R188 Other ascites: Secondary | ICD-10-CM

## 2021-01-25 LAB — CBC
HCT: 32.2 % — ABNORMAL LOW (ref 36.0–46.0)
Hemoglobin: 10.1 g/dL — ABNORMAL LOW (ref 12.0–15.0)
MCH: 24.9 pg — ABNORMAL LOW (ref 26.0–34.0)
MCHC: 31.4 g/dL (ref 30.0–36.0)
MCV: 79.5 fL — ABNORMAL LOW (ref 80.0–100.0)
Platelets: 380 10*3/uL (ref 150–400)
RBC: 4.05 MIL/uL (ref 3.87–5.11)
RDW: 18.9 % — ABNORMAL HIGH (ref 11.5–15.5)
WBC: 21.2 10*3/uL — ABNORMAL HIGH (ref 4.0–10.5)
nRBC: 0 % (ref 0.0–0.2)

## 2021-01-25 LAB — BASIC METABOLIC PANEL
Anion gap: 14 (ref 5–15)
BUN: 14 mg/dL (ref 6–20)
CO2: 21 mmol/L — ABNORMAL LOW (ref 22–32)
Calcium: 8.3 mg/dL — ABNORMAL LOW (ref 8.9–10.3)
Chloride: 95 mmol/L — ABNORMAL LOW (ref 98–111)
Creatinine, Ser: 0.57 mg/dL (ref 0.44–1.00)
GFR, Estimated: >60 mL/min (ref >60)
Glucose, Bld: 105 mg/dL — ABNORMAL HIGH (ref 70–99)
Potassium: 4.6 mmol/L (ref 3.5–5.1)
Sodium: 130 mmol/L — ABNORMAL LOW (ref 135–145)

## 2021-01-25 LAB — CK: Total CK: 11 U/L — ABNORMAL LOW (ref 38–234)

## 2021-01-25 MED ORDER — SODIUM CHLORIDE 0.9 % IV SOLN
600.0000 mg | Freq: Two times a day (BID) | INTRAVENOUS | Status: DC
  Administered 2021-01-25: 600 mg via INTRAVENOUS
  Filled 2021-01-25 (×2): qty 20

## 2021-01-25 MED ORDER — HYDROMORPHONE HCL 1 MG/ML IJ SOLN
2.0000 mg | INTRAMUSCULAR | Status: DC | PRN
  Administered 2021-01-25 – 2021-02-08 (×87): 2 mg via INTRAVENOUS
  Filled 2021-01-25 (×91): qty 2

## 2021-01-25 MED ORDER — BISACODYL 10 MG RE SUPP
10.0000 mg | Freq: Every day | RECTAL | Status: DC | PRN
  Administered 2021-02-03: 10 mg via RECTAL

## 2021-01-25 MED ORDER — SODIUM CHLORIDE 0.9 % IV SOLN
600.0000 mg | Freq: Three times a day (TID) | INTRAVENOUS | Status: DC
  Administered 2021-01-25 – 2021-01-30 (×14): 600 mg via INTRAVENOUS
  Filled 2021-01-25 (×18): qty 20

## 2021-01-25 MED ORDER — POLYETHYLENE GLYCOL 3350 17 G PO PACK
17.0000 g | PACK | Freq: Every day | ORAL | Status: DC
  Administered 2021-01-31 – 2021-02-01 (×2): 17 g via ORAL
  Filled 2021-01-25 (×4): qty 1

## 2021-01-25 MED ORDER — KETOROLAC TROMETHAMINE 30 MG/ML IJ SOLN
30.0000 mg | Freq: Three times a day (TID) | INTRAMUSCULAR | Status: AC | PRN
  Administered 2021-01-25 – 2021-01-30 (×13): 30 mg via INTRAVENOUS
  Filled 2021-01-25 (×17): qty 1

## 2021-01-25 MED ORDER — NALOXONE HCL 0.4 MG/ML IJ SOLN: 0.4000 mg | INTRAMUSCULAR | Status: DC | PRN

## 2021-01-25 MED ORDER — BLISTEX MEDICATED EX OINT
TOPICAL_OINTMENT | CUTANEOUS | Status: DC | PRN
  Administered 2021-01-25: 1 via TOPICAL
  Filled 2021-01-25: qty 6.3

## 2021-01-25 MED ORDER — SODIUM CHLORIDE 0.9 % IV SOLN
8.0000 mg/kg | Freq: Every day | INTRAVENOUS | Status: DC
  Administered 2021-01-25 – 2021-02-21 (×28): 650 mg via INTRAVENOUS
  Filled 2021-01-25 (×29): qty 13

## 2021-01-25 NOTE — Consult Note (Deleted)
ORTHOPAEDIC CONSULTATION   REQUESTING PHYSICIAN: Jonetta Osgood, MD   Chief Complaint: Hip pelvic pain   HPI: Kelly [name] is a 35 y.o. female who presents with history of IV drug usage presents with over a week of pain with ambulation.  She denies any previous issues with this.  She states that her pain began insidiously and has been so significant that she has been using a walker which she had from a previous back surgery.  She denies fevers or chills at this time.  Denies history of pelvic abscesses.       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 12/05/2017   Abscess of breast      rt breast   Abscess of right breast 07/13/2014   Acute cystitis without hematuria     ADD (attention deficit disorder)     Back pain     Bacteremia due to Enterococcus 06/15/2018   Bipolar 1 disorder (HCC)     Endocarditis 2020   Foreign body granuloma of soft tissue, NEC, right upper arm     History of hepatitis C 10/01/2018   Hypertension     Infection of left wrist ([place]) 10/26/2020   Influenza B 06/17/2018   Leukocytosis     Multifocal pneumonia 06/14/2018   Polysubstance abuse ([place])     Pyelonephritis affecting pregnancy 05/24/2016   Sepsis ([place]) 12/02/2017   Suicidal overdose ([place])     Thigh abscess 10/26/2020   Vaginal discharge 10/26/2020         Past Surgical History:  Procedure Laterality Date   BACK SURGERY        scoliosis-in care everywhere 08/1999   BUBBLE STUDY   11/01/2020    Procedure: BUBBLE STUDY;  Surgeon: Donato Heinz, MD;  Location: [place];  Service: Cardiovascular;;   CESAREAN SECTION       CHOLECYSTECTOMY   March 2015   FOREIGN BODY REMOVAL Right 05/28/2016    Procedure: REMOVAL FOREIGN BODY RIGHT ARM;  Surgeon: Meredith Pel, MD;  Location: [place];  Service: Orthopedics;  Laterality: Right;   I & D EXTREMITY Left 10/26/2020    Procedure: IRRIGATION AND DEBRIDEMENT LEFT WRIST;  Surgeon: Iran Planas,  MD;  Location: [place];  Service: Orthopedics;  Laterality: Left;   INCISION AND DRAINAGE ABSCESS Right 11/01/2020    Procedure: INCISION AND DRAINAGE ABSCESS RIGHT THIGH X 2;  Surgeon: Ileana Roup, MD;  Location: [place];  Service: General;  Laterality: Right;   IRRIGATION AND DEBRIDEMENT ABSCESS Right 07/13/2014    Procedure: IRRIGATION AND DEBRIDEMEN TRIGHT BREAST ABSCESS;  Surgeon: Donnie Mesa, MD;  Location: [place];  Service: General;  Laterality: Right;   LUMBAR LAMINECTOMY/DECOMPRESSION MICRODISCECTOMY N/A 12/05/2017    Procedure: LUMBAR LAMINECTOMY FOR EPIDURAL ABSCESS;  Surgeon: Newman Pies, MD;  Location: [place];  Service: Neurosurgery;  Laterality: N/A;   MINOR IRRIGATION AND DEBRIDEMENT OF WOUND Left 10/26/2020    Procedure: MINOR IRRIGATION AND DEBRIDEMENT OF LEFT BREAST AND BILATERAL THIGHS;  Surgeon: Iran Planas, MD;  Location: [place];  Service: Orthopedics;  Laterality: Left;   MULTIPLE EXTRACTIONS WITH ALVEOLOPLASTY Bilateral 07/03/2018    Procedure: Extraction of tooth #'s 30 and 31 with alveoloplasty;  Surgeon: Lenn Cal, DDS;  Location: [place];  Service: Oral Surgery;  Laterality: Bilateral;   RADIOLOGY WITH ANESTHESIA N/A 12/05/2017    Procedure: MRI WITH ANESTHESIA OF THORCIC AND LUMBAR SPINE WITH AND WITHOUT;  Surgeon:  Radiologist, Medication, MD;  Location: [place];  Service: Radiology;  Laterality: N/A;   RIGHT/LEFT HEART CATH AND CORONARY ANGIOGRAPHY N/A 05/21/2019    Procedure: RIGHT/LEFT HEART CATH AND CORONARY ANGIOGRAPHY;  Surgeon: Jolaine Artist, MD;  Location: [place] [facility];  Service: Cardiovascular;  Laterality: N/A;   TEE WITHOUT CARDIOVERSION N/A 05/21/2019    Procedure: TRANSESOPHAGEAL ECHOCARDIOGRAM (TEE);  Surgeon: Jolaine Artist, MD;  Location: [facility];  Service: Cardiovascular;  Laterality: N/A;   TEE WITHOUT CARDIOVERSION N/A 11/01/2020    Procedure: TRANSESOPHAGEAL ECHOCARDIOGRAM (TEE);  Surgeon: Donato Heinz, MD;   Location: [facility] ENDOSCOPY;  Service: Cardiovascular;  Laterality: N/A;   TUBAL LIGATION Bilateral 09/30/2020    Procedure: POST PARTUM TUBAL LIGATION;  Surgeon: Osborne Oman, MD;  Location: [facility];  Service: Gynecology;  Laterality: Bilateral;    Social History         Socioeconomic History   Marital status: Single      Spouse name: Not on file   Number of children: Not on file   Years of education: Not on file   Highest education level: Not on file  Occupational History   Occupation: unemployed  Tobacco Use   Smoking status: Former      Packs/day: 1.00      Years: 13.00      Pack years: 13.00      Types: Cigarettes      Quit date: 06/13/2018      Years since quitting: 2.6   Smokeless tobacco: Never  Vaping Use   Vaping Use: Never used  Substance and Sexual Activity   Alcohol use: Not Currently      Comment: one or two drinks weekly   Drug use: Yes      Types: Cocaine, Heroin, IV, Fentanyl      Comment: heroin, crack cocaine, fentanyl last used today (07/13/20)    Sexual activity: Yes      Birth control/protection: Condom  Other Topics Concern   Not on file  Social History Narrative   Not on file    Social Determinants of Health       Financial Resource Strain: High Risk   Difficulty of Paying Living Expenses: Very hard  Food Insecurity: No Food Insecurity   Worried About Charity fundraiser in the Last Year: Never true   Arboriculturist in the Last Year: Never true  Transportation Needs: Public librarian (Medical): Yes   Lack of Transportation (Non-Medical): Yes  Physical Activity: Not on file  Stress: Not on file  Social Connections: Not on file         Family History  Problem Relation Age of Onset   Diabetes Mother     Diabetes Father     Hypertension Father     Heart attack Neg Hx     Hyperlipidemia Neg Hx     Sudden death Neg Hx      - negative except otherwise stated in the family history section     Allergies   Allergen Reactions   Hydrocodone Itching   Morphine And Related Nausea And Vomiting           Prior to Admission medications   Medication Sig Start Date End Date Taking? Authorizing Provider  furosemide (LASIX) 40 MG tablet Take 1 tablet (40 mg total) by mouth daily. Patient not taking: Reported on 01/21/2021 08/26/20     Aletha Halim, MD  METHADONE HCL DISKETS  PO Take 130 mg by mouth in the morning. Patient not taking: Reported on 01/20/2021       [provider]  NIFEdipine (ADALAT CC) 30 MG 24 hr tablet Take 1 tablet (30 mg total) by mouth daily. Patient not taking: Reported on 01/21/2021 10/05/20     Woodroe Mode, MD  prenatal vitamin w/FE, FA (NATACHEW) 29-1 MG CHEW chewable tablet Chew 1 tablet by mouth daily at 12 noon. Patient not taking: Reported on 01/21/2021 11/04/20     Thurnell Lose, MD  sertraline (ZOLOFT) 50 MG tablet Take 1 tablet (50 mg total) by mouth daily. Patient not taking: Reported on 01/21/2021 08/26/20     Aletha Halim, MD    MR PELVIS W WO CONTRAST   Result Date: 01/24/2021 CLINICAL DATA:  History of polysubstance IV drug abuse with recurrent history of endocarditis. 3-4 day history of pelvic pain. EXAM: MRI PELVIS WITHOUT AND WITH CONTRAST TECHNIQUE: Multiplanar multisequence MR imaging of the pelvis was performed both before and after administration of intravenous contrast. CONTRAST:  8.77m GADAVIST GADOBUTROL 1 MMOL/ML IV SOLN COMPARISON:  CT scan 01/19/2021 FINDINGS: Urinary Tract:  There is a Foley catheter in a decompressed bladder. Bowel: The rectum, sigmoid colon and visualized small-bowel loops are grossly. Vascular/Lymphatic: The major vascular structures appear patent. Scattered pelvic and inguinal lymphadenopathy, likely reactive. Reproductive:  The uterus and ovaries. Other:  Small amount of free pelvic fluid is noted. Musculoskeletal: There are findings septic arthritis noted at the pubic symphysis with osteomyelitis involving the pubic  bones. There is also adjacent abscess is both anterior and posterior to the pubic symphysis. There is also severe myofasciitis involving the adjacent adductor muscles along with areas of pyomyositis. The hips are normally located. I do not see any findings for septic arthritis involving the hips or the SI joints. IMPRESSION: 1. 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 2. Severe myofasciitis involving the adjacent adductor muscles along with areas of pyomyositis. 3. No findings for septic arthritis involving the hips or SI joints. 4. Free pelvic fluid but no intrapelvic abscess. Electronically Signed   By: PMarijo SanesM.D.   On: 01/24/2021 21:50       Positive ROS: All other systems have been reviewed and were otherwise negative with the exception of those mentioned in the HPI and as above.   Physical Exam: General: No acute distress Cardiovascular: No pedal edema Respiratory: No cyanosis, no use of accessory musculature GI: No organomegaly, abdomen is soft and non-tender Skin: No lesions in the area of chief complaint Neurologic: Sensation intact distally Psychiatric: Patient is at baseline mood and affect Lymphatic: No axillary or cervical lymphadenopathy   MUSCULOSKELETAL:  On examination she does have tenderness about the symphysis without a palpable fluid collection.  She has tenderness with compression about the pelvis.  She has no pain with logroll of either hip.  There is no obvious redness or erythema about the anterior pelvis   Independent Imaging Review: MRI pelvis reveals a fluid collection which is continuous with the pubic symphysis   Assessment: 35year old female with history of IV drug use and multiple abscesses/endocarditis now with a pubic symphysis abscess in addition to fluid collection.  I do believe that her pain is coming from the infection associated with  the symphysis.  At this time I do not believe that an open procedure would be necessary.  I would recommend IR  consultation to localize her parasymphyseal fluid collections and drain.  I think this would be an ideal strategy for localizing and mitigating the abscesses.  I agree with 6 weeks of IV antibiotics at a minimum to help treat this infection.   Plan: -Recommend infectious disease consult for antibiotic treatment and selection -Recommend IR consultation for possible abscess draining under localization -If she is not experiencing any relief from this prolonged period of treatment, we can discuss ultimate symphyseal debridement although this is a rather morbid surgery and should be in response to a failed course of antibiotics   Thank you for the consult and the opportunity to see Ms. Carolin Guernsey, MD [facility] 12:58 PM

## 2021-01-25 NOTE — Consult Note (Signed)
Reason for Consult:Pubic symphysis septic arthritis Referring Physician: Oren Binet Time called: 3009 Time at bedside: Daniel is an 35 y.o. female.  HPI: [place] c/o ~ 2 week hx/o bilateral hip and ant thigh pain with associated weakness. She came to the hospital about a week ago and was admitted with sepsis. MRI of the pelvis was obtained yesterday and showed pubic symphysis septic arthritis with scattered small abscesses and possible rami osteo and orthopedic surgery was consulted. She denies any prior hx/o similar.  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 12/05/2017   Abscess of breast    rt breast   Abscess of right breast 07/13/2014   Acute cystitis without hematuria    ADD (attention deficit disorder)    Back pain    Bacteremia due to Enterococcus 06/15/2018   Bipolar 1 disorder (HCC)    Endocarditis 2020   Foreign body granuloma of soft tissue, NEC, right upper arm    History of hepatitis C 10/01/2018   Hypertension    Infection of left wrist ([place]) 10/26/2020   Influenza B 06/17/2018   Leukocytosis    Multifocal pneumonia 06/14/2018   Polysubstance abuse ([place])    Pyelonephritis affecting pregnancy 05/24/2016   Sepsis ([place]) 12/02/2017   Suicidal overdose ([place])    Thigh abscess 10/26/2020   Vaginal discharge 10/26/2020    Past Surgical History:  Procedure Laterality Date   BACK SURGERY     scoliosis-in care everywhere 08/1999   BUBBLE STUDY  11/01/2020   Procedure: BUBBLE STUDY;  Surgeon: Donato Heinz, MD;  Location: [facility];  Service: Cardiovascular;;   CESAREAN SECTION     CHOLECYSTECTOMY  March 2015   FOREIGN BODY REMOVAL Right 05/28/2016   Procedure: REMOVAL FOREIGN BODY RIGHT ARM;  Surgeon: Meredith Pel, MD;  Location: [facility];  Service: Orthopedics;  Laterality: Right;   I & D EXTREMITY Left 10/26/2020   Procedure: IRRIGATION AND DEBRIDEMENT LEFT WRIST;  Surgeon: Iran Planas, MD;   Location: [facility];  Service: Orthopedics;  Laterality: Left;   INCISION AND DRAINAGE ABSCESS Right 11/01/2020   Procedure: INCISION AND DRAINAGE ABSCESS RIGHT THIGH X 2;  Surgeon: Ileana Roup, MD;  Location: [facility];  Service: General;  Laterality: Right;   IRRIGATION AND DEBRIDEMENT ABSCESS Right 07/13/2014   Procedure: IRRIGATION AND DEBRIDEMEN TRIGHT BREAST ABSCESS;  Surgeon: Donnie Mesa, MD;  Location: [facility];  Service: General;  Laterality: Right;   LUMBAR LAMINECTOMY/DECOMPRESSION MICRODISCECTOMY N/A 12/05/2017   Procedure: LUMBAR LAMINECTOMY FOR EPIDURAL ABSCESS;  Surgeon: Newman Pies, MD;  Location: [facility];  Service: Neurosurgery;  Laterality: N/A;   MINOR IRRIGATION AND DEBRIDEMENT OF WOUND Left 10/26/2020   Procedure: MINOR IRRIGATION AND DEBRIDEMENT OF LEFT BREAST AND BILATERAL THIGHS;  Surgeon: Iran Planas, MD;  Location: [facility];  Service: Orthopedics;  Laterality: Left;   MULTIPLE EXTRACTIONS WITH ALVEOLOPLASTY Bilateral 07/03/2018   Procedure: Extraction of tooth #'s 30 and 31 with alveoloplasty;  Surgeon: Lenn Cal, DDS;  Location: [facility];  Service: Oral Surgery;  Laterality: Bilateral;   RADIOLOGY WITH ANESTHESIA N/A 12/05/2017   Procedure: MRI WITH ANESTHESIA OF [facility] AND LUMBAR SPINE WITH AND WITHOUT;  Surgeon: Radiologist, Medication, MD;  Location: [facility];  Service: Radiology;  Laterality: N/A;   RIGHT/LEFT HEART CATH AND CORONARY ANGIOGRAPHY N/A 05/21/2019   Procedure: RIGHT/LEFT HEART CATH AND CORONARY ANGIOGRAPHY;  Surgeon: Jolaine Artist, MD;  Location: [facility];  Service: Cardiovascular;  Laterality: N/A;   TEE WITHOUT CARDIOVERSION N/A 05/21/2019   Procedure: TRANSESOPHAGEAL ECHOCARDIOGRAM (TEE);  Surgeon: Jolaine Artist, MD;  Location: [name] ENDOSCOPY;  Service: Cardiovascular;  Laterality: N/A;   TEE WITHOUT CARDIOVERSION N/A 11/01/2020   Procedure: TRANSESOPHAGEAL ECHOCARDIOGRAM (TEE);  Surgeon: Donato Heinz, MD;  Location: [facility]  ENDOSCOPY;  Service: Cardiovascular;  Laterality: N/A;   TUBAL LIGATION Bilateral 09/30/2020   Procedure: POST PARTUM TUBAL LIGATION;  Surgeon: Osborne Oman, MD;  Location: [facility];  Service: Gynecology;  Laterality: Bilateral;    Family History  Problem Relation Age of Onset   Diabetes Mother    Diabetes Father    Hypertension Father    Heart attack Neg Hx    Hyperlipidemia Neg Hx    Sudden death Neg Hx     Social History:  reports that she quit smoking about 2 years ago. Her smoking use included cigarettes. She has a 13.00 pack-year smoking history. She has never used smokeless tobacco. She reports that she does not currently use alcohol. She reports current drug use. Drugs: Cocaine, Heroin, IV, and Fentanyl.  Allergies:  Allergies  Allergen Reactions   Hydrocodone Itching   Morphine And Related Nausea And Vomiting    Medications: I have reviewed the patient's current medications.  Results for orders placed or performed during the hospital encounter of 01/19/21 (from the past 48 hour(s))  Vancomycin, trough     Status: Abnormal   Collection Time: 01/23/21 10:37 AM  Result Value Ref Range   Vancomycin Tr 6 (L) 15 - 20 ug/mL    Comment: Performed at [facility], [address] [place], [place] [address]  Procalcitonin - Baseline     Status: None   Collection Time: 01/23/21 10:37 AM  Result Value Ref Range   Procalcitonin 0.34 ng/mL    Comment:        Interpretation: PCT (Procalcitonin) <= 0.5 ng/mL: Systemic infection (sepsis) is not likely. Local bacterial infection is possible. (NOTE)       Sepsis PCT Algorithm           Lower Respiratory Tract                                      Infection PCT Algorithm    ----------------------------     ----------------------------         PCT < 0.25 ng/mL                PCT < 0.10 ng/mL          Strongly encourage             Strongly discourage   discontinuation of antibiotics    initiation of antibiotics     ----------------------------     -----------------------------       PCT 0.25 - 0.50 ng/mL            PCT 0.10 - 0.25 ng/mL               OR       >80% decrease in PCT            Discourage initiation of  antibiotics      Encourage discontinuation           of antibiotics    ----------------------------     -----------------------------         PCT >= 0.50 ng/mL              PCT 0.26 - 0.50 ng/mL               AND        <80% decrease in PCT             Encourage initiation of                                             antibiotics       Encourage continuation           of antibiotics    ----------------------------     -----------------------------        PCT >= 0.50 ng/mL                  PCT > 0.50 ng/mL               AND         increase in PCT                  Strongly encourage                                      initiation of antibiotics    Strongly encourage escalation           of antibiotics                                     -----------------------------                                           PCT <= 0.25 ng/mL                                                 OR                                        > 80% decrease in PCT                                      Discontinue / Do not initiate                                             antibiotics  Performed at [facility], [place] [address] [place], [place] [address]   Culture, blood (Routine X 2) w Reflex to ID Panel  Status: Abnormal (Preliminary result)   Collection Time: 01/23/21  1:00 PM   Specimen: BLOOD RIGHT HAND  Result Value Ref Range   Specimen Description BLOOD RIGHT HAND    Special Requests IN PEDIATRIC BOTTLE Blood Culture adequate volume    Culture  Setup Time      GRAM POSITIVE COCCI AEROBIC BOTTLE ONLY CRITICAL VALUE NOTED.  VALUE IS CONSISTENT WITH PREVIOUSLY REPORTED AND CALLED VALUE.    Culture (A)     STAPHYLOCOCCUS AUREUS SUSCEPTIBILITIES  PERFORMED ON PREVIOUS CULTURE WITHIN THE LAST 5 DAYS. Performed at [facility], [place] [address], [place], [place] [address]    Report Status PENDING   Magnesium     Status: None   Collection Time: 01/24/21  3:27 AM  Result Value Ref Range   Magnesium 1.7 1.7 - 2.4 mg/dL    Comment: Performed at [facility], [place] [address] [place], [place] [address]  Basic metabolic panel     Status: Abnormal   Collection Time: 01/24/21  6:53 AM  Result Value Ref Range   Sodium 125 (L) 135 - 145 mmol/L   Potassium 4.2 3.5 - 5.1 mmol/L   Chloride 93 (L) 98 - 111 mmol/L   CO2 23 22 - 32 mmol/L   Glucose, Bld 91 70 - 99 mg/dL    Comment: Glucose reference range applies only to samples taken after fasting for at least 8 hours.   BUN 12 6 - 20 mg/dL   Creatinine, Ser 0.52 0.44 - 1.00 mg/dL   Calcium 8.0 (L) 8.9 - 10.3 mg/dL   GFR, Estimated >60 >60 mL/min    Comment: (NOTE) Calculated using the CKD-EPI Creatinine Equation (2021)    Anion gap 9 5 - 15    Comment: Performed at [facility] [address], [place], [place] [address]  CBC     Status: Abnormal   Collection Time: 01/24/21  6:53 AM  Result Value Ref Range   WBC 22.9 (H) 4.0 - 10.5 K/uL   RBC 4.09 3.87 - 5.11 MIL/uL   Hemoglobin 9.8 (L) 12.0 - 15.0 g/dL   HCT 32.3 (L) 36.0 - 46.0 %   MCV 79.0 (L) 80.0 - 100.0 fL   MCH 24.0 (L) 26.0 - 34.0 pg   MCHC 30.3 30.0 - 36.0 g/dL   RDW 18.6 (H) 11.5 - 15.5 %   Platelets 213 150 - 400 K/uL   nRBC 0.0 0.0 - 0.2 %    Comment: Performed at [facility], [place] [address] [place], [place] [address]  Magnesium     Status: None   Collection Time: 01/24/21  6:53 AM  Result Value Ref Range   Magnesium 1.7 1.7 - 2.4 mg/dL    Comment: Performed at [facility] [address], [place], [place] [address]  Culture, blood (Routine X 2) w Reflex to ID Panel     Status: Abnormal (Preliminary result)   Collection Time: 01/24/21  6:54 AM   Specimen: BLOOD  Result Value Ref Range    Specimen Description BLOOD RIGHT ANTECUBITAL    Special Requests      AEROBIC BOTTLE ONLY Blood Culture results may not be optimal due to an inadequate volume of blood received in culture bottles   Culture  Setup Time      GRAM POSITIVE COCCI AEROBIC BOTTLE ONLY CRITICAL VALUE NOTED.  VALUE IS CONSISTENT WITH PREVIOUSLY REPORTED AND CALLED VALUE.    Culture (A)  STAPHYLOCOCCUS AUREUS SUSCEPTIBILITIES PERFORMED ON PREVIOUS CULTURE WITHIN THE LAST 5 DAYS. Performed at [facility], [place] [address], [place], [place] [address]    Report Status PENDING   Basic metabolic panel     Status: Abnormal   Collection Time: 01/25/21  4:14 AM  Result Value Ref Range   Sodium 130 (L) 135 - 145 mmol/L   Potassium 4.6 3.5 - 5.1 mmol/L   Chloride 95 (L) 98 - 111 mmol/L   CO2 21 (L) 22 - 32 mmol/L   Glucose, Bld 105 (H) 70 - 99 mg/dL    Comment: Glucose reference range applies only to samples taken after fasting for at least 8 hours.   BUN 14 6 - 20 mg/dL   Creatinine, Ser 0.57 0.44 - 1.00 mg/dL   Calcium 8.3 (L) 8.9 - 10.3 mg/dL   GFR, Estimated >60 >60 mL/min    Comment: (NOTE) Calculated using the CKD-EPI Creatinine Equation (2021)    Anion gap 14 5 - 15    Comment: Performed at [facility] [address] [place], [place] [address]  CBC     Status: Abnormal   Collection Time: 01/25/21  4:14 AM  Result Value Ref Range   WBC 21.2 (H) 4.0 - 10.5 K/uL   RBC 4.05 3.87 - 5.11 MIL/uL   Hemoglobin 10.1 (L) 12.0 - 15.0 g/dL   HCT 32.2 (L) 36.0 - 46.0 %   MCV 79.5 (L) 80.0 - 100.0 fL   MCH 24.9 (L) 26.0 - 34.0 pg   MCHC 31.4 30.0 - 36.0 g/dL   RDW 18.9 (H) 11.5 - 15.5 %   Platelets 380 150 - 400 K/uL   nRBC 0.0 0.0 - 0.2 %    Comment: Performed at [facility] [address] [place], [place] [address]    MR PELVIS W WO CONTRAST  Result Date: 01/24/2021 CLINICAL DATA:  History of polysubstance IV drug abuse with recurrent history of endocarditis. 3-4 day history of  pelvic pain. EXAM: MRI PELVIS WITHOUT AND WITH CONTRAST TECHNIQUE: Multiplanar multisequence MR imaging of the pelvis was performed both before and after administration of intravenous contrast. CONTRAST:  8.41m GADAVIST GADOBUTROL 1 MMOL/ML IV SOLN COMPARISON:  CT scan 01/19/2021 FINDINGS: Urinary Tract:  There is a Foley catheter in a decompressed bladder. Bowel: The rectum, sigmoid colon and visualized small-bowel loops are grossly. Vascular/Lymphatic: The major vascular structures appear patent. Scattered pelvic and inguinal lymphadenopathy, likely reactive. Reproductive:  The uterus and ovaries. Other:  Small amount of free pelvic fluid is noted. Musculoskeletal: There are findings septic arthritis noted at the pubic symphysis with osteomyelitis involving the pubic bones. There is also adjacent abscess is both anterior and posterior to the pubic symphysis. There is also severe myofasciitis involving the adjacent adductor muscles along with areas of pyomyositis. The hips are normally located. I do not see any findings for septic arthritis involving the hips or the SI joints. IMPRESSION: 1. 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 2. Severe myofasciitis involving the adjacent adductor muscles along with areas of pyomyositis. 3. No findings for septic arthritis involving the hips or SI joints. 4. Free pelvic fluid but no intrapelvic abscess. Electronically Signed   By: PMarijo SanesM.D.   On: 01/24/2021 21:50    Review of Systems  HENT:  Negative for ear discharge, ear pain, hearing loss and tinnitus.   Eyes:  Negative for photophobia and pain.  Respiratory:  Negative for cough and shortness of breath.   Cardiovascular:  Negative for chest pain.  Gastrointestinal:  Negative for abdominal pain, nausea and vomiting.  Genitourinary:  Negative for dysuria, flank pain, frequency and urgency.   Musculoskeletal:  Positive for arthralgias (Bilateral hips/pelvis). Negative for back pain, myalgias and neck pain.  Neurological:  Positive for weakness. Negative for dizziness and headaches.  Hematological:  Does not bruise/bleed easily.  Psychiatric/Behavioral:  The patient is not nervous/anxious.   Blood pressure 119/78, pulse 100, temperature 97.6 F (36.4 C), temperature source Oral, resp. rate 20, height 5' 7"  (1.702 m), weight 82 kg, SpO2 99 %, unknown if currently breastfeeding. Physical Exam Constitutional:      General: She is not in acute distress.    Appearance: She is well-developed. She is not diaphoretic.  HENT:     Head: Normocephalic and atraumatic.  Eyes:     General: No scleral icterus.       Right eye: No discharge.        Left eye: No discharge.     Conjunctiva/sclera: Conjunctivae normal.  Cardiovascular:     Rate and Rhythm: Normal rate and regular rhythm.  Pulmonary:     Effort: Pulmonary effort is normal. No respiratory distress.  Musculoskeletal:     Cervical back: Normal range of motion.     Comments: Pelvis--no traumatic wounds or rash, no ecchymosis, stable to manual stress, mod TTP with AP/lat compression, 2+ DP bilaterally   Skin:    General: Skin is warm and dry.  Neurological:     Mental Status: She is alert.  Psychiatric:        Mood and Affect: Mood normal.        Behavior: Behavior normal.    Assessment/Plan: Pubic symphysis septic arthritis -- Recommend 6 weeks abx therapy as directed by ID. She had a positive blood culture on admission but if ID thought it helpful could consider IR aspiration of joint and/or small abscess. Repeat MRI should be obtained towards end of treatment and, if still positive for disease, f/u with Dr. Sammuel Hines to discuss symphysectomy and possible further debridement. Multiple medical problems including HTN, systolic CHF with moderate pulmonary hypertension and severe TR, polysubstance abuse including history of IV  drug use, recurrent history of endocarditis, and hx/o C. difficile colitis -- per primary service    Lisette Abu, PA-C Orthopedic Surgery [phone] 01/25/2021, 9:58 AM

## 2021-01-25 NOTE — Progress Notes (Signed)
PT Cancellation Note  Patient Details Name: Kelly [name] MRN: [id number] DOB: 02-Jan-1986   Cancelled Treatment:    Reason Eval/Treat Not Completed: Pain limiting ability to participate  Pt just returned to bed from standing at EOB for bed change (RN confirms). Per RN, may require surgery and pt understandably upset at this news.    Arby Barrette, PT Pager [phone]    Rexanne Mano 01/25/2021, 11:33 AM

## 2021-01-25 NOTE — Progress Notes (Addendum)
PROGRESS NOTE    Kelly [name]  BTD:[id number] DOB: 22-Apr-1986 DOA: 01/19/2021 PCP: Pcp, No   Chief Complaint  Patient presents with   Abdominal Pain    Brief Narrative:  Kelly [name] is a 35 y.o. 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  Subjective: Complains of severe pain in the pelvic area  Objective: Vitals: Blood pressure 112/76, pulse 99, temperature 97.7 F (36.5 C), temperature source Oral, resp. rate 20, height 5' 7"  (1.702 m), weight 82 kg, SpO2 96 %, unknown if currently breastfeeding.   Exam: Gen Exam:Alert awake-not in any distress HEENT:atraumatic, normocephalic Chest: B/L clear to auscultation anteriorly CVS:S1S2 regular Abdomen:soft non tender, non distended Extremities:no edema Neurology: Non focal Skin: no rash   Pertinent labs/x-rays WBC: 21.2 Hb: 10.1 Na: 130 Blood culture on 9/12 and 9/14: MRSA MRI pelvis on 9/13: Septic arthritis of pubic symphysis/osteomyelitis and surrounding abscesses Echo on 9/8: EF 36-46%, RV systolic function reduced.  RVSP 68.6 mmHg.  Severe TR.  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  ID has switched antibiotics from vancomycin to Teflaro and daptomycin.   Appreciate orthopedic input-have consulted IR for possible percutaneous drainage of pelvic abscess.  Will likely need repeat blood cultures in the next few days.  Bacterial vaginosis: On p.o. Flagyl.  Hyponatremia: Euvolemic-improving-with fluid restriction.   Follow periodically.  Prolonged QTC: Resolved on EKG on 9/9.  Monitor in telemetry.  Monitor periodically as patient on methadone.  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  Microcytic anemia: No evidence of overt blood loss-hemoglobin stable compared to prior values.  DVT prophylaxis: Lovenox Code Status: Full Family Communication: None at bedside Disposition:   Status is: Inpatient  Remains inpatient appropriate because:IV treatments appropriate due to intensity of illness or inability to take PO  Dispo: The patient is from: Home              Anticipated d/c is to: Home              Patient currently is not medically stable to d/c.   Difficult to place patient Yes     Consultants:  ID Ortho IR   Data Reviewed: I have personally reviewed following labs and imaging studies  CBC: Recent Labs  Lab 01/19/21 0030 01/20/21 1132 01/21/21 0021 01/22/21 0602 01/23/21 0034 01/24/21 0653 01/25/21 0414  WBC 19.0*   < > 24.2* 26.6* 26.1* 22.9* 21.2*  NEUTROABS 17.9*  --   --   --   --   --   --   HGB 10.5*   < > 10.4* 10.7* 10.4* 9.8* 10.1*  HCT 33.3*   < > 32.6* 33.3* 34.0* 32.3* 32.2*  MCV 78.2*   < > 77.1* 76.6* 78.9* 79.0* 79.5*  PLT 272   < > 228 190 189 213 380   < > = values in this interval not displayed.  Basic Metabolic Panel: Recent Labs  Lab 01/21/21 0021 01/22/21 0602 01/23/21 0034 01/24/21 0327 01/24/21 0653 01/25/21 0414  NA 126* 124* 125*  --  125* 130*  K 4.5 4.1 4.7  --  4.2 4.6  CL 92* 93* 94*  --  93* 95*  CO2 24 22 20*  --  23 21*  GLUCOSE 89 108* 109*  --  91 105*  BUN 11 10 14   --  12 14  CREATININE 0.55 0.52 0.57  --  0.52 0.57  CALCIUM 7.9* 7.8* 7.6*  --  8.0* 8.3*  MG 1.9  1.7 1.8 1.7 1.7  --      GFR: Estimated Creatinine Clearance: 107.1 mL/min (by C-G formula based on SCr of 0.57 mg/dL).  Liver Function Tests: Recent Labs  Lab 01/19/21 0030  AST 13*  ALT 9  ALKPHOS 195*  BILITOT 0.6  PROT 6.2*  ALBUMIN 1.8*     CBG: No results for input(s): GLUCAP in the last 168 hours.   Recent Results (from the past 240 hour(s))  Urine Culture     Status: Abnormal   Collection Time: 01/19/21 12:15 AM   Specimen: In/Out Cath Urine  Result Value Ref Range Status   Specimen Description IN/OUT CATH URINE  Final   Special Requests   Final    NONE Performed at [facility], [address] [place], [place] [address]    Culture (A)  Final    50,000 COLONIES/mL METHICILLIN RESISTANT STAPHYLOCOCCUS AUREUS   Report Status 01/21/2021 FINAL  Final   Organism ID, Bacteria METHICILLIN RESISTANT STAPHYLOCOCCUS AUREUS (A)  Final      Susceptibility   Methicillin resistant staphylococcus aureus - MIC*    CIPROFLOXACIN >=8 RESISTANT Resistant     GENTAMICIN <=0.5 SENSITIVE Sensitive     NITROFURANTOIN <=16 SENSITIVE Sensitive     OXACILLIN >=4 RESISTANT Resistant     TETRACYCLINE >=16 RESISTANT Resistant     VANCOMYCIN <=0.5 SENSITIVE Sensitive     TRIMETH/SULFA 160 RESISTANT Resistant     CLINDAMYCIN <=0.25 SENSITIVE Sensitive     RIFAMPIN <=0.5 SENSITIVE Sensitive     Inducible Clindamycin NEGATIVE Sensitive     * 50,000 COLONIES/mL METHICILLIN RESISTANT STAPHYLOCOCCUS AUREUS  Resp Panel by RT-PCR (Flu A&B, Covid) Nasopharyngeal Swab     Status: None   Collection Time: 01/19/21 12:21 AM   Specimen: Nasopharyngeal Swab; Nasopharyngeal(NP) swabs in vial transport medium  Result Value Ref Range Status   SARS Coronavirus 2 by RT PCR NEGATIVE NEGATIVE Final    Comment: (NOTE) SARS-CoV-2 target nucleic acids are NOT DETECTED.  The SARS-CoV-2 RNA is generally detectable in upper respiratory specimens during the acute phase of infection. The  lowest concentration of SARS-CoV-2 viral copies this assay can detect is 138 copies/mL. A negative result does not preclude SARS-Cov-2 infection and should not be used as the sole basis for treatment or other patient management decisions. A negative result may occur with  improper specimen collection/handling, submission of specimen other than nasopharyngeal swab, presence of viral mutation(s) within the areas targeted by this assay, and inadequate number of viral copies(<138 copies/mL). A negative result must be combined with clinical observations, patient history, and epidemiological information. The expected result is Negative.  Fact Sheet for Patients:  EntrepreneurPulse.com.au  Fact Sheet for Healthcare Providers:  IncredibleEmployment.be  This test is no t yet approved or cleared by the [place] FDA and  has been authorized for detection and/or diagnosis of SARS-CoV-2 by FDA under an  Emergency Use Authorization (EUA). This EUA will remain  in effect (meaning this test can be used) for the duration of the COVID-19 declaration under Section 564(b)(1) of the Act, 21 U.S.C.section 360bbb-3(b)(1), unless the authorization is terminated  or revoked sooner.       Influenza A by PCR NEGATIVE NEGATIVE Final   Influenza B by PCR NEGATIVE NEGATIVE Final    Comment: (NOTE) The Xpert Xpress SARS-CoV-2/FLU/RSV plus assay is intended as an aid in the diagnosis of influenza from Nasopharyngeal swab specimens and should not be used as a sole basis for treatment. Nasal washings and aspirates are unacceptable for Xpert Xpress SARS-CoV-2/FLU/RSV testing.  Fact Sheet for Patients: EntrepreneurPulse.com.au  Fact Sheet for Healthcare Providers: IncredibleEmployment.be  This test is not yet approved or cleared by the [place] FDA and has been authorized for detection and/or diagnosis of SARS-CoV-2 by FDA under  an Emergency Use Authorization (EUA). This EUA will remain in effect (meaning this test can be used) for the duration of the COVID-19 declaration under Section 564(b)(1) of the Act, 21 U.S.C. section 360bbb-3(b)(1), unless the authorization is terminated or revoked.  Performed at [facility], [place] [address], [place], [place] [address]   Blood Culture (routine x 2)     Status: Abnormal   Collection Time: 01/19/21 12:30 AM   Specimen: BLOOD RIGHT HAND  Result Value Ref Range Status   Specimen Description BLOOD RIGHT HAND  Final   Special Requests   Final    BOTTLES DRAWN AEROBIC AND ANAEROBIC Blood Culture results may not be optimal due to an inadequate volume of blood received in culture bottles   Culture  Setup Time   Final    GRAM POSITIVE COCCI IN CLUSTERS IN BOTH AEROBIC AND ANAEROBIC BOTTLES CRITICAL RESULT CALLED TO, READ BACK BY AND VERIFIED WITH: PHARMD ALEX L 1332 [id number] FCP    Culture (A)  Final    STAPHYLOCOCCUS AUREUS SUSCEPTIBILITIES PERFORMED ON PREVIOUS CULTURE WITHIN THE LAST 5 DAYS. Performed at [facility], [place] [address] [place], [place] [address]    Report Status 01/21/2021 FINAL  Final  Blood Culture ID Panel (Reflexed)     Status: Abnormal   Collection Time: 01/19/21 12:30 AM  Result Value Ref Range Status   Enterococcus faecalis NOT DETECTED NOT DETECTED Final   Enterococcus Faecium NOT DETECTED NOT DETECTED Final   Listeria monocytogenes NOT DETECTED NOT DETECTED Final   Staphylococcus species DETECTED (A) NOT DETECTED Final    Comment: CRITICAL RESULT CALLED TO, READ BACK BY AND VERIFIED WITH: PHARMD ALEX L 1332 [id number] FCP    Staphylococcus aureus (BCID) DETECTED (A) NOT DETECTED Final    Comment: Methicillin (oxacillin)-resistant Staphylococcus aureus (MRSA). MRSA is predictably resistant to beta-lactam antibiotics (except ceftaroline). Preferred therapy is vancomycin unless clinically contraindicated. Patient requires contact precautions  if  hospitalized. CRITICAL RESULT CALLED TO, READ BACK BY AND VERIFIED WITH: PHARMD ALEX L [phone] FCP    Staphylococcus epidermidis NOT DETECTED NOT DETECTED Final   Staphylococcus lugdunensis NOT DETECTED NOT DETECTED Final   Streptococcus species NOT DETECTED NOT DETECTED Final   Streptococcus agalactiae NOT DETECTED NOT DETECTED Final   Streptococcus pneumoniae NOT DETECTED NOT DETECTED Final   Streptococcus pyogenes NOT DETECTED NOT DETECTED Final   A.calcoaceticus-baumannii NOT DETECTED NOT DETECTED Final   Bacteroides fragilis NOT DETECTED NOT DETECTED Final   Enterobacterales NOT DETECTED NOT DETECTED Final   Enterobacter cloacae complex NOT DETECTED NOT DETECTED Final   Escherichia coli NOT  DETECTED NOT DETECTED Final   Klebsiella aerogenes NOT DETECTED NOT DETECTED Final   Klebsiella oxytoca NOT DETECTED NOT DETECTED Final   Klebsiella pneumoniae NOT DETECTED NOT DETECTED Final   Proteus species NOT DETECTED NOT DETECTED Final   Salmonella species NOT DETECTED NOT DETECTED Final   Serratia marcescens NOT DETECTED NOT DETECTED Final   Haemophilus influenzae NOT DETECTED NOT DETECTED Final   Neisseria meningitidis NOT DETECTED NOT DETECTED Final   Pseudomonas aeruginosa NOT DETECTED NOT DETECTED Final   Stenotrophomonas maltophilia NOT DETECTED NOT DETECTED Final   Candida albicans NOT DETECTED NOT DETECTED Final   Candida auris NOT DETECTED NOT DETECTED Final   Candida glabrata NOT DETECTED NOT DETECTED Final   Candida krusei NOT DETECTED NOT DETECTED Final   Candida parapsilosis NOT DETECTED NOT DETECTED Final   Candida tropicalis NOT DETECTED NOT DETECTED Final   Cryptococcus neoformans/gattii NOT DETECTED NOT DETECTED Final   Meth resistant mecA/C and MREJ DETECTED (A) NOT DETECTED Final    Comment: CRITICAL RESULT CALLED TO, READ BACK BY AND VERIFIED WITH: PHARMD ALEX L 1332 [id number] FCP Performed at [facility], [address] [place], [place] [address]    Blood Culture (routine x 2)     Status: Abnormal   Collection Time: 01/19/21 12:34 AM   Specimen: BLOOD LEFT WRIST  Result Value Ref Range Status   Specimen Description BLOOD LEFT WRIST  Final   Special Requests   Final    BOTTLES DRAWN AEROBIC AND ANAEROBIC Blood Culture results may not be optimal due to an inadequate volume of blood received in culture bottles   Culture  Setup Time   Final    GRAM POSITIVE COCCI IN BOTH AEROBIC AND ANAEROBIC BOTTLES Performed at [facility], [place] [address], [place], [place] [address]    Culture METHICILLIN RESISTANT STAPHYLOCOCCUS AUREUS (A)  Final   Report Status 01/21/2021 FINAL  Final   Organism ID, Bacteria METHICILLIN RESISTANT STAPHYLOCOCCUS AUREUS  Final      Susceptibility   Methicillin resistant staphylococcus aureus - MIC*    CIPROFLOXACIN >=8 RESISTANT Resistant     ERYTHROMYCIN >=8 RESISTANT Resistant     GENTAMICIN <=0.5 SENSITIVE Sensitive     OXACILLIN >=4 RESISTANT Resistant     TETRACYCLINE >=16 RESISTANT Resistant     VANCOMYCIN 1 SENSITIVE Sensitive     TRIMETH/SULFA >=320 RESISTANT Resistant     CLINDAMYCIN <=0.25 SENSITIVE Sensitive     RIFAMPIN <=0.5 SENSITIVE Sensitive     Inducible Clindamycin NEGATIVE Sensitive     * METHICILLIN RESISTANT STAPHYLOCOCCUS AUREUS  Wet prep, genital     Status: Abnormal   Collection Time: 01/19/21  5:20 AM   Specimen: PATH Cytology Cervicovaginal Ancillary Only  Result Value Ref Range Status   Yeast Wet Prep HPF POC NONE SEEN NONE SEEN Final   Trich, Wet Prep NONE SEEN NONE SEEN Final   Clue Cells Wet Prep HPF POC PRESENT (A) NONE SEEN Final   WBC, Wet Prep HPF POC MANY (A) NONE SEEN Final   Sperm NONE SEEN  Final    Comment: Performed at [facility], [place] [address], [place],  [address]  Culture, blood (Routine X 2) w Reflex to ID Panel     Status: Abnormal   Collection Time: 01/23/21  1:00 PM   Specimen: BLOOD RIGHT HAND  Result Value Ref Range Status   Specimen  Description BLOOD RIGHT HAND  Final   Special Requests IN PEDIATRIC BOTTLE  Blood Culture adequate volume  Final   Culture  Setup Time   Final    GRAM POSITIVE COCCI AEROBIC BOTTLE ONLY CRITICAL VALUE NOTED.  VALUE IS CONSISTENT WITH PREVIOUSLY REPORTED AND CALLED VALUE.    Culture (A)  Final    STAPHYLOCOCCUS AUREUS SUSCEPTIBILITIES PERFORMED ON PREVIOUS CULTURE WITHIN THE LAST 5 DAYS. Performed at [facility], [place] [address] [place], [place] [address]    Report Status 01/25/2021 FINAL  Final  Culture, blood (Routine X 2) w Reflex to ID Panel     Status: Abnormal (Preliminary result)   Collection Time: 01/24/21  6:54 AM   Specimen: BLOOD  Result Value Ref Range Status   Specimen Description BLOOD RIGHT ANTECUBITAL  Final   Special Requests   Final    AEROBIC BOTTLE ONLY Blood Culture results may not be optimal due to an inadequate volume of blood received in culture bottles   Culture  Setup Time   Final    GRAM POSITIVE COCCI AEROBIC BOTTLE ONLY CRITICAL VALUE NOTED.  VALUE IS CONSISTENT WITH PREVIOUSLY REPORTED AND CALLED VALUE.    Culture (A)  Final    STAPHYLOCOCCUS AUREUS CULTURE REINCUBATED FOR BETTER GROWTH Performed at [facility], [place] [address], [place], [place] [address]    Report Status PENDING  Incomplete          Radiology Studies: MR PELVIS W WO CONTRAST  Result Date: 01/24/2021 CLINICAL DATA:  History of polysubstance IV drug abuse with recurrent history of endocarditis. 3-4 day history of pelvic pain. EXAM: MRI PELVIS WITHOUT AND WITH CONTRAST TECHNIQUE: Multiplanar multisequence MR imaging of the pelvis was performed both before and after administration of intravenous contrast. CONTRAST:  8.65m GADAVIST GADOBUTROL 1 MMOL/ML IV SOLN COMPARISON:  CT scan 01/19/2021 FINDINGS: Urinary Tract:  There is a Foley catheter in a decompressed bladder. Bowel: The rectum, sigmoid colon and visualized small-bowel loops are grossly. Vascular/Lymphatic: The  major vascular structures appear patent. Scattered pelvic and inguinal lymphadenopathy, likely reactive. Reproductive:  The uterus and ovaries. Other:  Small amount of free pelvic fluid is noted. Musculoskeletal: There are findings septic arthritis noted at the pubic symphysis with osteomyelitis involving the pubic bones. There is also adjacent abscess is both anterior and posterior to the pubic symphysis. There is also severe myofasciitis involving the adjacent adductor muscles along with areas of pyomyositis. The hips are normally located. I do not see any findings for septic arthritis involving the hips or the SI joints. IMPRESSION: 1. 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 2. Severe myofasciitis involving the adjacent adductor muscles along with areas of pyomyositis. 3. No findings for septic arthritis involving the hips or SI joints. 4. Free pelvic fluid but no intrapelvic abscess. Electronically Signed   By: PMarijo SanesM.D.   On: 01/24/2021 21:50        Scheduled Meds:  Chlorhexidine Gluconate Cloth  6 each Topical Q0600   enoxaparin (LOVENOX) injection  40 mg Subcutaneous Q24H   methadone  60 mg Oral Daily   metroNIDAZOLE  500 mg Oral Q12H   senna-docusate  1 tablet Oral BID   Continuous Infusions:  sodium chloride 250 mL (01/25/21 0720)   ceFTAROline (TEFLARO) IV     DAPTOmycin (CUBICIN)  IV       LOS: 6 days        SOren Binet MD [facility]   To contact  the attending provider between 7A-7P or the covering provider during after hours 7P-7A, please log into the web site [email or web address] and access using universal [place] password for that web site. If you do not have the password, please call the hospital operator.  01/25/2021, 1:46 PM

## 2021-01-25 NOTE — Consult Note (Signed)
ORTHOPAEDIC CONSULTATION   REQUESTING PHYSICIAN: Jonetta Osgood, MD   Chief Complaint: Hip pelvic pain   HPI: Kelly [name] is a 35 y.o. female who presents with history of IV drug usage presents with over a week of pain with ambulation.  She denies any previous issues with this.  She states that her pain began insidiously and has been so significant that she has been using a walker which she had from a previous back surgery.  She denies fevers or chills at this time.  Denies history of pelvic abscesses.       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 12/05/2017   Abscess of breast      rt breast   Abscess of right breast 07/13/2014   Acute cystitis without hematuria     ADD (attention deficit disorder)     Back pain     Bacteremia due to Enterococcus 06/15/2018   Bipolar 1 disorder (HCC)     Endocarditis 2020   Foreign body granuloma of soft tissue, NEC, right upper arm     History of hepatitis C 10/01/2018   Hypertension     Infection of left wrist ([place]) 10/26/2020   Influenza B 06/17/2018   Leukocytosis     Multifocal pneumonia 06/14/2018   Polysubstance abuse ([place])     Pyelonephritis affecting pregnancy 05/24/2016   Sepsis ([place]) 12/02/2017   Suicidal overdose ([place])     Thigh abscess 10/26/2020   Vaginal discharge 10/26/2020         Past Surgical History:  Procedure Laterality Date   BACK SURGERY        scoliosis-in care everywhere 08/1999   BUBBLE STUDY   11/01/2020    Procedure: BUBBLE STUDY;  Surgeon: Donato Heinz, MD;  Location: [place];  Service: Cardiovascular;;   CESAREAN SECTION       CHOLECYSTECTOMY   March 2015   FOREIGN BODY REMOVAL Right 05/28/2016    Procedure: REMOVAL FOREIGN BODY RIGHT ARM;  Surgeon: Meredith Pel, MD;  Location: [place];  Service: Orthopedics;  Laterality: Right;   I & D EXTREMITY Left 10/26/2020    Procedure: IRRIGATION AND DEBRIDEMENT LEFT WRIST;  Surgeon: Iran Planas,  MD;  Location: [place];  Service: Orthopedics;  Laterality: Left;   INCISION AND DRAINAGE ABSCESS Right 11/01/2020    Procedure: INCISION AND DRAINAGE ABSCESS RIGHT THIGH X 2;  Surgeon: Ileana Roup, MD;  Location: [place];  Service: General;  Laterality: Right;   IRRIGATION AND DEBRIDEMENT ABSCESS Right 07/13/2014    Procedure: IRRIGATION AND DEBRIDEMEN TRIGHT BREAST ABSCESS;  Surgeon: Donnie Mesa, MD;  Location: [place];  Service: General;  Laterality: Right;   LUMBAR LAMINECTOMY/DECOMPRESSION MICRODISCECTOMY N/A 12/05/2017    Procedure: LUMBAR LAMINECTOMY FOR EPIDURAL ABSCESS;  Surgeon: Newman Pies, MD;  Location: [place];  Service: Neurosurgery;  Laterality: N/A;   MINOR IRRIGATION AND DEBRIDEMENT OF WOUND Left 10/26/2020    Procedure: MINOR IRRIGATION AND DEBRIDEMENT OF LEFT BREAST AND BILATERAL THIGHS;  Surgeon: Iran Planas, MD;  Location: [place];  Service: Orthopedics;  Laterality: Left;   MULTIPLE EXTRACTIONS WITH ALVEOLOPLASTY Bilateral 07/03/2018    Procedure: Extraction of tooth #'s 30 and 31 with alveoloplasty;  Surgeon: Lenn Cal, DDS;  Location: [place];  Service: Oral Surgery;  Laterality: Bilateral;   RADIOLOGY WITH ANESTHESIA N/A 12/05/2017    Procedure: MRI WITH ANESTHESIA OF THORCIC AND LUMBAR SPINE WITH AND WITHOUT;  Surgeon:  Radiologist, Medication, MD;  Location: [place];  Service: Radiology;  Laterality: N/A;   RIGHT/LEFT HEART CATH AND CORONARY ANGIOGRAPHY N/A 05/21/2019    Procedure: RIGHT/LEFT HEART CATH AND CORONARY ANGIOGRAPHY;  Surgeon: Jolaine Artist, MD;  Location: [place] [facility];  Service: Cardiovascular;  Laterality: N/A;   TEE WITHOUT CARDIOVERSION N/A 05/21/2019    Procedure: TRANSESOPHAGEAL ECHOCARDIOGRAM (TEE);  Surgeon: Jolaine Artist, MD;  Location[facility] ENDOSCOPY;  Service: Cardiovascular;  Laterality: N/A;   TEE WITHOUT CARDIOVERSION N/A 11/01/2020    Procedure: TRANSESOPHAGEAL ECHOCARDIOGRAM (TEE);  Surgeon: Donato Heinz, MD;   Location: [name] ENDOSCOPY;  Service: Cardiovascular;  Laterality: N/A;   TUBAL LIGATION Bilateral 09/30/2020    Procedure: POST PARTUM TUBAL LIGATION;  Surgeon: Osborne Oman, MD;  Location: [facility];  Service: Gynecology;  Laterality: Bilateral;    Social History         Socioeconomic History   Marital status: Single      Spouse name: Not on file   Number of children: Not on file   Years of education: Not on file   Highest education level: Not on file  Occupational History   Occupation: unemployed  Tobacco Use   Smoking status: Former      Packs/day: 1.00      Years: 13.00      Pack years: 13.00      Types: Cigarettes      Quit date: 06/13/2018      Years since quitting: 2.6   Smokeless tobacco: Never  Vaping Use   Vaping Use: Never used  Substance and Sexual Activity   Alcohol use: Not Currently      Comment: one or two drinks weekly   Drug use: Yes      Types: Cocaine, Heroin, IV, Fentanyl      Comment: heroin, crack cocaine, fentanyl last used today (07/13/20)    Sexual activity: Yes      Birth control/protection: Condom  Other Topics Concern   Not on file  Social History Narrative   Not on file    Social Determinants of Health       Financial Resource Strain: High Risk   Difficulty of Paying Living Expenses: Very hard  Food Insecurity: No Food Insecurity   Worried About Charity fundraiser in the Last Year: Never true   Arboriculturist in the Last Year: Never true  Transportation Needs: Public librarian (Medical): Yes   Lack of Transportation (Non-Medical): Yes  Physical Activity: Not on file  Stress: Not on file  Social Connections: Not on file         Family History  Problem Relation Age of Onset   Diabetes Mother     Diabetes Father     Hypertension Father     Heart attack Neg Hx     Hyperlipidemia Neg Hx     Sudden death Neg Hx      - negative except otherwise stated in the family history section     Allergies   Allergen Reactions   Hydrocodone Itching   Morphine And Related Nausea And Vomiting           Prior to Admission medications   Medication Sig Start Date End Date Taking? Authorizing Provider  furosemide (LASIX) 40 MG tablet Take 1 tablet (40 mg total) by mouth daily. Patient not taking: Reported on 01/21/2021 08/26/20     Aletha Halim, MD  METHADONE HCL DISKETS  PO Take 130 mg by mouth in the morning. Patient not taking: Reported on 01/20/2021       [provider]  NIFEdipine (ADALAT CC) 30 MG 24 hr tablet Take 1 tablet (30 mg total) by mouth daily. Patient not taking: Reported on 01/21/2021 10/05/20     Woodroe Mode, MD  prenatal vitamin w/FE, FA (NATACHEW) 29-1 MG CHEW chewable tablet Chew 1 tablet by mouth daily at 12 noon. Patient not taking: Reported on 01/21/2021 11/04/20     Thurnell Lose, MD  sertraline (ZOLOFT) 50 MG tablet Take 1 tablet (50 mg total) by mouth daily. Patient not taking: Reported on 01/21/2021 08/26/20     Aletha Halim, MD    MR PELVIS W WO CONTRAST   Result Date: 01/24/2021 CLINICAL DATA:  History of polysubstance IV drug abuse with recurrent history of endocarditis. 3-4 day history of pelvic pain. EXAM: MRI PELVIS WITHOUT AND WITH CONTRAST TECHNIQUE: Multiplanar multisequence MR imaging of the pelvis was performed both before and after administration of intravenous contrast. CONTRAST:  8.109m GADAVIST GADOBUTROL 1 MMOL/ML IV SOLN COMPARISON:  CT scan 01/19/2021 FINDINGS: Urinary Tract:  There is a Foley catheter in a decompressed bladder. Bowel: The rectum, sigmoid colon and visualized small-bowel loops are grossly. Vascular/Lymphatic: The major vascular structures appear patent. Scattered pelvic and inguinal lymphadenopathy, likely reactive. Reproductive:  The uterus and ovaries. Other:  Small amount of free pelvic fluid is noted. Musculoskeletal: There are findings septic arthritis noted at the pubic symphysis with osteomyelitis involving the pubic  bones. There is also adjacent abscess is both anterior and posterior to the pubic symphysis. There is also severe myofasciitis involving the adjacent adductor muscles along with areas of pyomyositis. The hips are normally located. I do not see any findings for septic arthritis involving the hips or the SI joints. IMPRESSION: 1. 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 2. Severe myofasciitis involving the adjacent adductor muscles along with areas of pyomyositis. 3. No findings for septic arthritis involving the hips or SI joints. 4. Free pelvic fluid but no intrapelvic abscess. Electronically Signed   By: PMarijo SanesM.D.   On: 01/24/2021 21:50       Positive ROS: All other systems have been reviewed and were otherwise negative with the exception of those mentioned in the HPI and as above.   Physical Exam: General: No acute distress Cardiovascular: No pedal edema Respiratory: No cyanosis, no use of accessory musculature GI: No organomegaly, abdomen is soft and non-tender Skin: No lesions in the area of chief complaint Neurologic: Sensation intact distally Psychiatric: Patient is at baseline mood and affect Lymphatic: No axillary or cervical lymphadenopathy   MUSCULOSKELETAL:  On examination she does have tenderness about the symphysis without a palpable fluid collection.  She has tenderness with compression about the pelvis.  She has no pain with logroll of either hip.  There is no obvious redness or erythema about the anterior pelvis   Independent Imaging Review: MRI pelvis reveals a fluid collection which is continuous with the pubic symphysis   Assessment: 35year old female with history of IV drug use and multiple abscesses/endocarditis now with a pubic symphysis abscess in addition to fluid collection.  I do believe that her pain is coming from the infection associated with  the symphysis.  At this time I do not believe that an open procedure would be necessary.  I would recommend IR  consultation to localize her parasymphyseal fluid collections and drain.  I think this would be an ideal strategy for localizing and mitigating the abscesses.  I agree with 6 weeks of IV antibiotics at a minimum to help treat this infection.   Plan: -Recommend infectious disease consult for antibiotic treatment and selection -Recommend IR consultation for possible abscess draining under localization -If she is not experiencing any relief from this prolonged period of treatment, we can discuss ultimate symphyseal debridement although this is a rather morbid surgery and should be in response to a failed course of antibiotics   Thank you for the consult and the opportunity to see Ms. Carolin Guernsey, MD [name] 12:58 PM

## 2021-01-25 NOTE — Progress Notes (Addendum)
[facility]  Date of Admission:  01/19/2021      Total days of antibiotics 6   Vancomycin 9/08 >> 9/14  Daptomycin + Ceftaroline 9/14 >>            ASSESSMENT: Kelly [name] is a 35 y.o. 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 She was readmitted with chest pain/cough and back/pelvic pain and lower extremity weakness.   Still growing MRSA 5 days into therapy. Vanc AUC > 500 so in range. May be ultimately due to burden of infection, but will change to daptomycin + ceftaroline to try to sterilize her blood more efficiently. Will check baseline CK and trend.   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 Appreciate ortho consultation -  surgery would have high degree of morbility and recommended prolonged antibiotic course for treatment. IR consulted to assess abscesses for possible therapeutic drainage in hopes to decrease burden of infection.   Last TEE was 11/01/2020 >> EF 40-45%, severe TV regurgitation with mobile calcified mass vs torn cord from old endocarditis. TTE 01/19/2021 >> EF 40-45%, severe TV regurgitation without any obvious vegetation. CT scan of abdomen/pelvis mentioned Innumerable pulmonary nodules within the visualized lung bases several of which demonstrate cavitary change suspicious for innumerable septic emboli.  Repeating her TEE is unnecessary given the constellation of findings above and the fact that she is not a surgical candidate for valve replacement.   H/O CDiff - with ceftaroline hopeful to use as short as possible; follow for diarrhea.     PLAN: Please continue to hold on PICC until she is no longer bacteremic if possible  Stop vancomycin  Start Daptomycin 11m/kg QD +  ceftaroline 600 mg Q8h IV Repeat blood cultures in AM  Follow IR recommendations     Principal Problem:   MRSA bacteremia Active Problems:   Endocarditis of tricuspid valve   Severe tricuspid regurgitation   Back pain   Polysubstance abuse (HCC)   IVDU (intravenous drug user)   Hyponatremia   Prolonged QT interval   Sepsis (HCC)   Constipation   Pelvic fluid collection    Chlorhexidine Gluconate Cloth  6 each Topical Q0600   enoxaparin (LOVENOX) injection  40 mg Subcutaneous Q24H   methadone  60 mg Oral Daily   metroNIDAZOLE  500 mg Oral Q12H   polyethylene glycol  17 g Oral Daily   senna-docusate  1 tablet Oral BID    SUBJECTIVE: Pain is severe - said the ketorolac helped a lot when she was not able to get the dilaudid.  No new pain. Foley placed for urinary retention. Almost passed out from pain trying to walk today.   Remorseful/tearful over difficult social situation she is in currently and desire to stay clean long term.    Review of Systems: Review of Systems  Constitutional:  Positive for malaise/fatigue. Negative for chills and fever.  HENT:  Negative for tinnitus.   Eyes:  Negative for blurred vision and photophobia.  Respiratory:  Positive for cough, hemoptysis, sputum production and shortness of breath.   Cardiovascular:  Positive for leg swelling. Negative for chest pain.  Gastrointestinal:  Positive for abdominal pain. Negative for diarrhea, nausea and vomiting.  Genitourinary:  Negative for dysuria.  Musculoskeletal:  Positive for  back pain and joint pain.  Skin:  Negative for rash.  Neurological:  Positive for weakness. Negative for headaches.   Allergies  Allergen Reactions   Hydrocodone Itching   Morphine And Related Nausea And Vomiting    OBJECTIVE: Vitals:   01/25/21 0058 01/25/21 0421 01/25/21 0822 01/25/21 1211  BP: (!) 132/97 128/85 119/78 112/76  Pulse: 96 (!) 108 100 99  Resp: 16 19 20 20   Temp: 98.5 F (36.9 C) 98.4 F (36.9 C)  97.6 F (36.4 C) 97.7 F (36.5 C)  TempSrc: Oral Oral Oral Oral  SpO2: 92% 95% 99% 96%  Weight:      Height:       Body mass index is 28.31 kg/m.  Physical Exam Vitals reviewed. Exam conducted with a chaperone present.  Constitutional:      Appearance: She is well-developed.     Comments: Appears uncomfortable in bed.   HENT:     Mouth/Throat:     Mouth: No oral lesions.     Dentition: Normal dentition. No dental abscesses.     Pharynx: No oropharyngeal exudate.  Cardiovascular:     Rate and Rhythm: Regular rhythm. Tachycardia present.     Heart sounds: Murmur heard.  Pulmonary:     Effort: Pulmonary effort is normal.     Breath sounds: Rhonchi present.     Comments: Wearing 2-3 LPM [place] with sats 90-92% Abdominal:     General: Bowel sounds are normal. There is no distension.     Palpations: Abdomen is soft.     Tenderness: There is generalized abdominal tenderness.  Lymphadenopathy:     Cervical: No cervical adenopathy.  Skin:    General: Skin is warm and dry.     Capillary Refill: Capillary refill takes less than 2 seconds.     Findings: No rash.  Neurological:     General: No focal deficit present.     Mental Status: She is alert and oriented to person, place, and time.  Psychiatric:        Judgment: Judgment normal.     Comments: In good spirits today and engaged in care discussion    Lab Results Lab Results  Component Value Date   WBC 21.2 (H) 01/25/2021   HGB 10.1 (L) 01/25/2021   HCT 32.2 (L) 01/25/2021   MCV 79.5 (L) 01/25/2021   PLT 380 01/25/2021    Lab Results  Component Value Date   CREATININE 0.57 01/25/2021   BUN 14 01/25/2021   NA 130 (L) 01/25/2021   K 4.6 01/25/2021   CL 95 (L) 01/25/2021   CO2 21 (L) 01/25/2021    Lab Results  Component Value Date   ALT 9 01/19/2021   AST 13 (L) 01/19/2021   ALKPHOS 195 (H) 01/19/2021   BILITOT 0.6 01/19/2021     Microbiology: Recent Results (from the past 240 hour(s))  Urine Culture      Status: Abnormal   Collection Time: 01/19/21 12:15 AM   Specimen: In/Out Cath Urine  Result Value Ref Range Status   Specimen Description IN/OUT CATH URINE  Final   Special Requests   Final    NONE Performed at [facility], [address] [place], [place] [address]    Culture (A)  Final    50,000 COLONIES/mL METHICILLIN RESISTANT STAPHYLOCOCCUS AUREUS   Report Status 01/21/2021 FINAL  Final   Organism ID, Bacteria METHICILLIN RESISTANT STAPHYLOCOCCUS AUREUS (A)  Final      Susceptibility   Methicillin resistant  staphylococcus aureus - MIC*    CIPROFLOXACIN >=8 RESISTANT Resistant     GENTAMICIN <=0.5 SENSITIVE Sensitive     NITROFURANTOIN <=16 SENSITIVE Sensitive     OXACILLIN >=4 RESISTANT Resistant     TETRACYCLINE >=16 RESISTANT Resistant     VANCOMYCIN <=0.5 SENSITIVE Sensitive     TRIMETH/SULFA 160 RESISTANT Resistant     CLINDAMYCIN <=0.25 SENSITIVE Sensitive     RIFAMPIN <=0.5 SENSITIVE Sensitive     Inducible Clindamycin NEGATIVE Sensitive     * 50,000 COLONIES/mL METHICILLIN RESISTANT STAPHYLOCOCCUS AUREUS  Resp Panel by RT-PCR (Flu A&B, Covid) Nasopharyngeal Swab     Status: None   Collection Time: 01/19/21 12:21 AM   Specimen: Nasopharyngeal Swab; Nasopharyngeal(NP) swabs in vial transport medium  Result Value Ref Range Status   SARS Coronavirus 2 by RT PCR NEGATIVE NEGATIVE Final    Comment: (NOTE) SARS-CoV-2 target nucleic acids are NOT DETECTED.  The SARS-CoV-2 RNA is generally detectable in upper respiratory specimens during the acute phase of infection. The lowest concentration of SARS-CoV-2 viral copies this assay can detect is 138 copies/mL. A negative result does not preclude SARS-Cov-2 infection and should not be used as the sole basis for treatment or other patient management decisions. A negative result may occur with  improper specimen collection/handling, submission of specimen other than nasopharyngeal swab, presence of viral mutation(s)  within the areas targeted by this assay, and inadequate number of viral copies(<138 copies/mL). A negative result must be combined with clinical observations, patient history, and epidemiological information. The expected result is Negative.  Fact Sheet for Patients:  EntrepreneurPulse.com.au  Fact Sheet for Healthcare Providers:  IncredibleEmployment.be  This test is no t yet approved or cleared by the [place] FDA and  has been authorized for detection and/or diagnosis of SARS-CoV-2 by FDA under an Emergency Use Authorization (EUA). This EUA will remain  in effect (meaning this test can be used) for the duration of the COVID-19 declaration under Section 564(b)(1) of the Act, 21 U.S.C.section 360bbb-3(b)(1), unless the authorization is terminated  or revoked sooner.       Influenza A by PCR NEGATIVE NEGATIVE Final   Influenza B by PCR NEGATIVE NEGATIVE Final    Comment: (NOTE) The Xpert Xpress SARS-CoV-2/FLU/RSV plus assay is intended as an aid in the diagnosis of influenza from Nasopharyngeal swab specimens and should not be used as a sole basis for treatment. Nasal washings and aspirates are unacceptable for Xpert Xpress SARS-CoV-2/FLU/RSV testing.  Fact Sheet for Patients: EntrepreneurPulse.com.au  Fact Sheet for Healthcare Providers: IncredibleEmployment.be  This test is not yet approved or cleared by the [place] FDA and has been authorized for detection and/or diagnosis of SARS-CoV-2 by FDA under an Emergency Use Authorization (EUA). This EUA will remain in effect (meaning this test can be used) for the duration of the COVID-19 declaration under Section 564(b)(1) of the Act, 21 U.S.C. section 360bbb-3(b)(1), unless the authorization is terminated or revoked.  Performed at [facility], [place] [address] [place], [place] [address]   Blood Culture (routine x 2)     Status: Abnormal    Collection Time: 01/19/21 12:30 AM   Specimen: BLOOD RIGHT HAND  Result Value Ref Range Status   Specimen Description BLOOD RIGHT HAND  Final   Special Requests   Final    BOTTLES DRAWN AEROBIC AND ANAEROBIC Blood Culture results may not be optimal due to an inadequate volume of blood received in culture bottles   Culture  Setup Time  Final    GRAM POSITIVE COCCI IN CLUSTERS IN BOTH AEROBIC AND ANAEROBIC BOTTLES CRITICAL RESULT CALLED TO, READ BACK BY AND VERIFIED WITH: PHARMD ALEX L [id number] FCP    Culture (A)  Final    STAPHYLOCOCCUS AUREUS SUSCEPTIBILITIES PERFORMED ON PREVIOUS CULTURE WITHIN THE LAST 5 DAYS. Performed at [facility], [place] [address] [place], [place] [address]    Report Status 01/21/2021 FINAL  Final  Blood Culture ID Panel (Reflexed)     Status: Abnormal   Collection Time: 01/19/21 12:30 AM  Result Value Ref Range Status   Enterococcus faecalis NOT DETECTED NOT DETECTED Final   Enterococcus Faecium NOT DETECTED NOT DETECTED Final   Listeria monocytogenes NOT DETECTED NOT DETECTED Final   Staphylococcus species DETECTED (A) NOT DETECTED Final    Comment: CRITICAL RESULT CALLED TO, READ BACK BY AND VERIFIED WITH: PHARMD ALEX L 1332 [id number] FCP    Staphylococcus aureus (BCID) DETECTED (A) NOT DETECTED Final    Comment: Methicillin (oxacillin)-resistant Staphylococcus aureus (MRSA). MRSA is predictably resistant to beta-lactam antibiotics (except ceftaroline). Preferred therapy is vancomycin unless clinically contraindicated. Patient requires contact precautions if  hospitalized. CRITICAL RESULT CALLED TO, READ BACK BY AND VERIFIED WITH: PHARMD ALEX L 1332 [id number] FCP    Staphylococcus epidermidis NOT DETECTED NOT DETECTED Final   Staphylococcus lugdunensis NOT DETECTED NOT DETECTED Final   Streptococcus species NOT DETECTED NOT DETECTED Final   Streptococcus agalactiae NOT DETECTED NOT DETECTED Final   Streptococcus pneumoniae NOT DETECTED NOT  DETECTED Final   Streptococcus pyogenes NOT DETECTED NOT DETECTED Final   A.calcoaceticus-baumannii NOT DETECTED NOT DETECTED Final   Bacteroides fragilis NOT DETECTED NOT DETECTED Final   Enterobacterales NOT DETECTED NOT DETECTED Final   Enterobacter cloacae complex NOT DETECTED NOT DETECTED Final   Escherichia coli NOT DETECTED NOT DETECTED Final   Klebsiella aerogenes NOT DETECTED NOT DETECTED Final   Klebsiella oxytoca NOT DETECTED NOT DETECTED Final   Klebsiella pneumoniae NOT DETECTED NOT DETECTED Final   Proteus species NOT DETECTED NOT DETECTED Final   Salmonella species NOT DETECTED NOT DETECTED Final   Serratia marcescens NOT DETECTED NOT DETECTED Final   Haemophilus influenzae NOT DETECTED NOT DETECTED Final   Neisseria meningitidis NOT DETECTED NOT DETECTED Final   Pseudomonas aeruginosa NOT DETECTED NOT DETECTED Final   Stenotrophomonas maltophilia NOT DETECTED NOT DETECTED Final   Candida albicans NOT DETECTED NOT DETECTED Final   Candida auris NOT DETECTED NOT DETECTED Final   Candida glabrata NOT DETECTED NOT DETECTED Final   Candida krusei NOT DETECTED NOT DETECTED Final   Candida parapsilosis NOT DETECTED NOT DETECTED Final   Candida tropicalis NOT DETECTED NOT DETECTED Final   Cryptococcus neoformans/gattii NOT DETECTED NOT DETECTED Final   Meth resistant mecA/C and MREJ DETECTED (A) NOT DETECTED Final    Comment: CRITICAL RESULT CALLED TO, READ BACK BY AND VERIFIED WITH: PHARMD ALEX L 1332 [id number] FCP Performed at [facility], [address], [place], [place] [address]   Blood Culture (routine x 2)     Status: Abnormal   Collection Time: 01/19/21 12:34 AM   Specimen: BLOOD LEFT WRIST  Result Value Ref Range Status   Specimen Description BLOOD LEFT WRIST  Final   Special Requests   Final    BOTTLES DRAWN AEROBIC AND ANAEROBIC Blood Culture results may not be optimal due to an inadequate volume of blood received in culture bottles   Culture  Setup Time    Final  GRAM POSITIVE COCCI IN BOTH AEROBIC AND ANAEROBIC BOTTLES Performed at [facility], [place] [address] [place], [place] [address]    Culture METHICILLIN RESISTANT STAPHYLOCOCCUS AUREUS (A)  Final   Report Status 01/21/2021 FINAL  Final   Organism ID, Bacteria METHICILLIN RESISTANT STAPHYLOCOCCUS AUREUS  Final      Susceptibility   Methicillin resistant staphylococcus aureus - MIC*    CIPROFLOXACIN >=8 RESISTANT Resistant     ERYTHROMYCIN >=8 RESISTANT Resistant     GENTAMICIN <=0.5 SENSITIVE Sensitive     OXACILLIN >=4 RESISTANT Resistant     TETRACYCLINE >=16 RESISTANT Resistant     VANCOMYCIN 1 SENSITIVE Sensitive     TRIMETH/SULFA >=320 RESISTANT Resistant     CLINDAMYCIN <=0.25 SENSITIVE Sensitive     RIFAMPIN <=0.5 SENSITIVE Sensitive     Inducible Clindamycin NEGATIVE Sensitive     * METHICILLIN RESISTANT STAPHYLOCOCCUS AUREUS  Wet prep, genital     Status: Abnormal   Collection Time: 01/19/21  5:20 AM   Specimen: PATH Cytology Cervicovaginal Ancillary Only  Result Value Ref Range Status   Yeast Wet Prep HPF POC NONE SEEN NONE SEEN Final   Trich, Wet Prep NONE SEEN NONE SEEN Final   Clue Cells Wet Prep HPF POC PRESENT (A) NONE SEEN Final   WBC, Wet Prep HPF POC MANY (A) NONE SEEN Final   Sperm NONE SEEN  Final    Comment: Performed at [facility], [place] [address] [place], [place] [address]  Culture, blood (Routine X 2) w Reflex to ID Panel     Status: Abnormal   Collection Time: 01/23/21  1:00 PM   Specimen: BLOOD RIGHT HAND  Result Value Ref Range Status   Specimen Description BLOOD RIGHT HAND  Final   Special Requests IN PEDIATRIC BOTTLE Blood Culture adequate volume  Final   Culture  Setup Time   Final    GRAM POSITIVE COCCI AEROBIC BOTTLE ONLY CRITICAL VALUE NOTED.  VALUE IS CONSISTENT WITH PREVIOUSLY REPORTED AND CALLED VALUE.    Culture (A)  Final    STAPHYLOCOCCUS AUREUS SUSCEPTIBILITIES PERFORMED ON PREVIOUS CULTURE WITHIN THE LAST 5  DAYS. Performed at [facility], [place] [address], [place], [place] [address]    Report Status 01/25/2021 FINAL  Final  Culture, blood (Routine X 2) w Reflex to ID Panel     Status: Abnormal (Preliminary result)   Collection Time: 01/24/21  6:54 AM   Specimen: BLOOD  Result Value Ref Range Status   Specimen Description BLOOD RIGHT ANTECUBITAL  Final   Special Requests   Final    AEROBIC BOTTLE ONLY Blood Culture results may not be optimal due to an inadequate volume of blood received in culture bottles   Culture  Setup Time   Final    GRAM POSITIVE COCCI AEROBIC BOTTLE ONLY CRITICAL VALUE NOTED.  VALUE IS CONSISTENT WITH PREVIOUSLY REPORTED AND CALLED VALUE.    Culture (A)  Final    STAPHYLOCOCCUS AUREUS CULTURE REINCUBATED FOR BETTER GROWTH Performed at [facility], [place] [address], [place], [place] [address]    Report Status PENDING  Incomplete    Janene Madeira, MSN, NP-[facility] Cell: [phone] Pager: [phone]  @TODAY @ 2:17 PM

## 2021-01-26 ENCOUNTER — Inpatient Hospital Stay (HOSPITAL_COMMUNITY): Payer: Medicaid Other

## 2021-01-26 ENCOUNTER — Encounter (HOSPITAL_COMMUNITY): Payer: Self-pay | Admitting: Internal Medicine

## 2021-01-26 DIAGNOSIS — R188 Other ascites: Secondary | ICD-10-CM | POA: Diagnosis not present

## 2021-01-26 DIAGNOSIS — R7881 Bacteremia: Secondary | ICD-10-CM | POA: Diagnosis not present

## 2021-01-26 DIAGNOSIS — F1990 Other psychoactive substance use, unspecified, uncomplicated: Secondary | ICD-10-CM | POA: Diagnosis not present

## 2021-01-26 DIAGNOSIS — F1910 Other psychoactive substance abuse, uncomplicated: Secondary | ICD-10-CM | POA: Diagnosis not present

## 2021-01-26 DIAGNOSIS — I079 Rheumatic tricuspid valve disease, unspecified: Secondary | ICD-10-CM | POA: Diagnosis not present

## 2021-01-26 LAB — COMPREHENSIVE METABOLIC PANEL
ALT: <5 U/L (ref 0–44)
AST: 10 U/L — ABNORMAL LOW (ref 15–41)
Albumin: 1.5 g/dL — ABNORMAL LOW (ref 3.5–5.0)
Alkaline Phosphatase: 103 U/L (ref 38–126)
Anion gap: 7 (ref 5–15)
BUN: 15 mg/dL (ref 6–20)
CO2: 23 mmol/L (ref 22–32)
Calcium: 8.2 mg/dL — ABNORMAL LOW (ref 8.9–10.3)
Chloride: 101 mmol/L (ref 98–111)
Creatinine, Ser: 0.52 mg/dL (ref 0.44–1.00)
GFR, Estimated: >60 mL/min (ref >60)
Glucose, Bld: 96 mg/dL (ref 70–99)
Potassium: 4.6 mmol/L (ref 3.5–5.1)
Sodium: 131 mmol/L — ABNORMAL LOW (ref 135–145)
Total Bilirubin: 0.4 mg/dL (ref 0.3–1.2)
Total Protein: 6.2 g/dL — ABNORMAL LOW (ref 6.5–8.1)

## 2021-01-26 LAB — CBC
HCT: 28.9 % — ABNORMAL LOW (ref 36.0–46.0)
Hemoglobin: 8.6 g/dL — ABNORMAL LOW (ref 12.0–15.0)
MCH: 24.1 pg — ABNORMAL LOW (ref 26.0–34.0)
MCHC: 29.8 g/dL — ABNORMAL LOW (ref 30.0–36.0)
MCV: 81 fL (ref 80.0–100.0)
Platelets: 426 10*3/uL — ABNORMAL HIGH (ref 150–400)
RBC: 3.57 MIL/uL — ABNORMAL LOW (ref 3.87–5.11)
RDW: 18.7 % — ABNORMAL HIGH (ref 11.5–15.5)
WBC: 16.6 10*3/uL — ABNORMAL HIGH (ref 4.0–10.5)
nRBC: 0 % (ref 0.0–0.2)

## 2021-01-26 MED ORDER — FENTANYL CITRATE (PF) 100 MCG/2ML IJ SOLN
INTRAMUSCULAR | Status: AC
  Filled 2021-01-26: qty 4

## 2021-01-26 MED ORDER — MIDAZOLAM HCL 2 MG/2ML IJ SOLN
INTRAMUSCULAR | Status: DC | PRN
Start: 2021-01-26 — End: 2021-02-20
  Administered 2021-01-26 (×2): 1 mg via INTRAVENOUS

## 2021-01-26 MED ORDER — ENOXAPARIN SODIUM 40 MG/0.4ML IJ SOSY: 40.0000 mg | PREFILLED_SYRINGE | INTRAMUSCULAR | Status: DC

## 2021-01-26 MED ORDER — LIDOCAINE HCL 1 % IJ SOLN
INTRAMUSCULAR | Status: AC
  Filled 2021-01-26: qty 10

## 2021-01-26 MED ORDER — FENTANYL CITRATE (PF) 100 MCG/2ML IJ SOLN
INTRAMUSCULAR | Status: DC | PRN
  Administered 2021-01-26 (×2): 50 ug via INTRAVENOUS

## 2021-01-26 MED ORDER — MIDAZOLAM HCL 2 MG/2ML IJ SOLN
INTRAMUSCULAR | Status: AC
  Filled 2021-01-26: qty 4

## 2021-01-26 NOTE — Progress Notes (Signed)
Pt off floor to IR.

## 2021-01-26 NOTE — Progress Notes (Signed)
[facility]  Date of Admission:  01/19/2021      Total days of antibiotics 7   Vancomycin 9/08 >> 9/14  Daptomycin + Ceftaroline 9/14 >>            ASSESSMENT: Kelly [name] is a 35 y.o. 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 She was readmitted with chest pain/cough and back/pelvic pain and lower extremity weakness.   Still growing MRSA 5 days into therapy >> changed to ceftaroline and daptomycin 9/14 to aid in clearance. WBC down today with repeat blood culture sets pending.   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 Underwent needle aspiration - suspect this all to be MRSA; nothing seems big enough for drain placement.   Last TEE was 11/01/2020 >> EF 40-45%, severe TV regurgitation with mobile calcified mass vs torn cord from old endocarditis. TTE 01/19/2021 >> EF 40-45%, severe TV regurgitation without any obvious vegetation. CT scan of abdomen/pelvis mentioned Innumerable pulmonary nodules within the visualized lung bases several of which demonstrate cavitary change suspicious for innumerable septic emboli.  Repeating TEE is unnecessary at this time given the constellation of findings above and the fact that she is not a surgical candidate for valve replacement.   H/O CDiff - with ceftaroline hopeful to use as short as possible; follow for diarrhea. She is not having any at this time.     PLAN: Please continue to hold on PICC until she is no longer bacteremic if possible  Stop vancomycin  continue Daptomycin 30m/kg QD + ceftaroline 600 mg Q8h IV Follow CK Follow repeat blood cultures Follow clinically for diarrhea     Principal Problem:   MRSA bacteremia Active Problems:   Endocarditis of  tricuspid valve   Severe tricuspid regurgitation   Back pain   Polysubstance abuse (HCC)   IVDU (intravenous drug user)   Hyponatremia   Prolonged QT interval   Sepsis (HCC)   Constipation   Pelvic fluid collection    Chlorhexidine Gluconate Cloth  6 each Topical Q0600   enoxaparin (LOVENOX) injection  40 mg Subcutaneous Q24H   methadone  60 mg Oral Daily   polyethylene glycol  17 g Oral Daily   senna-docusate  1 tablet Oral BID    SUBJECTIVE: Pain from pelvic aspiration today. Has noticed some chest pain periodically when she breathes harder/faster.  She does say she feels overall better - was able to turn with assistance and tolerate getting a bath for the first time since being here.    Review of Systems: Review of Systems  Constitutional:  Positive for malaise/fatigue. Negative for chills and fever.  HENT:  Negative for tinnitus.   Eyes:  Negative for blurred vision and photophobia.  Respiratory:  Positive for cough, hemoptysis and sputum production. Negative for shortness of breath.   Cardiovascular:  Positive for chest pain and leg swelling.  Gastrointestinal:  Positive for abdominal pain. Negative for diarrhea, nausea and vomiting.  Genitourinary:  Negative for dysuria.  Musculoskeletal:  Positive for back pain and joint pain.  Skin:  Negative for rash.  Neurological:  Positive for weakness. Negative for headaches.   Allergies  Allergen Reactions   Hydrocodone Itching   Morphine And Related Nausea And Vomiting    OBJECTIVE: Vitals:  01/26/21 1130 01/26/21 1145 01/26/21 1200 01/26/21 1230  BP: 97/64 105/66 120/82 110/80  Pulse: (!) 104 (!) 104 (!) 101 (!) 101  Resp: 19 20 20 20   Temp:  98 F (36.7 C)    TempSrc:  Oral    SpO2: 93% 94% 93% 94%  Weight:      Height:       Body mass index is 28.31 kg/m.  Physical Exam Vitals reviewed. Exam conducted with a chaperone present.  Constitutional:      Appearance: She is well-developed.     Comments:  Resting comfortably in bed.   HENT:     Mouth/Throat:     Mouth: No oral lesions.     Dentition: Normal dentition. No dental abscesses.     Pharynx: No oropharyngeal exudate.  Cardiovascular:     Rate and Rhythm: Regular rhythm. Tachycardia present.     Heart sounds: Murmur heard.  Pulmonary:     Effort: Pulmonary effort is normal.     Breath sounds: Rhonchi present.     Comments: 1 LPM Economy Abdominal:     General: Bowel sounds are normal. There is no distension.     Palpations: Abdomen is soft.     Tenderness: There is generalized abdominal tenderness.  Lymphadenopathy:     Cervical: No cervical adenopathy.  Skin:    General: Skin is warm and dry.     Capillary Refill: Capillary refill takes less than 2 seconds.     Findings: No rash.  Neurological:     General: No focal deficit present.     Mental Status: She is alert and oriented to person, place, and time.  Psychiatric:        Judgment: Judgment normal.     Comments: Pleasant.     Lab Results Lab Results  Component Value Date   WBC 16.6 (H) 01/26/2021   HGB 8.6 (L) 01/26/2021   HCT 28.9 (L) 01/26/2021   MCV 81.0 01/26/2021   PLT 426 (H) 01/26/2021    Lab Results  Component Value Date   CREATININE 0.52 01/26/2021   BUN 15 01/26/2021   NA 131 (L) 01/26/2021   K 4.6 01/26/2021   CL 101 01/26/2021   CO2 23 01/26/2021    Lab Results  Component Value Date   ALT <5 01/26/2021   AST 10 (L) 01/26/2021   ALKPHOS 103 01/26/2021   BILITOT 0.4 01/26/2021     Microbiology: Recent Results (from the past 240 hour(s))  Urine Culture     Status: Abnormal   Collection Time: 01/19/21 12:15 AM   Specimen: In/Out Cath Urine  Result Value Ref Range Status   Specimen Description IN/OUT CATH URINE  Final   Special Requests   Final    NONE Performed at [facility], [address] [place], [place] [address]    Culture (A)  Final    50,000 COLONIES/mL METHICILLIN RESISTANT STAPHYLOCOCCUS AUREUS   Report Status  01/21/2021 FINAL  Final   Organism ID, Bacteria METHICILLIN RESISTANT STAPHYLOCOCCUS AUREUS (A)  Final      Susceptibility   Methicillin resistant staphylococcus aureus - MIC*    CIPROFLOXACIN >=8 RESISTANT Resistant     GENTAMICIN <=0.5 SENSITIVE Sensitive     NITROFURANTOIN <=16 SENSITIVE Sensitive     OXACILLIN >=4 RESISTANT Resistant     TETRACYCLINE >=16 RESISTANT Resistant     VANCOMYCIN <=0.5 SENSITIVE Sensitive     TRIMETH/SULFA 160 RESISTANT Resistant     CLINDAMYCIN <=0.25 SENSITIVE  Sensitive     RIFAMPIN <=0.5 SENSITIVE Sensitive     Inducible Clindamycin NEGATIVE Sensitive     * 50,000 COLONIES/mL METHICILLIN RESISTANT STAPHYLOCOCCUS AUREUS  Resp Panel by RT-PCR (Flu A&B, Covid) Nasopharyngeal Swab     Status: None   Collection Time: 01/19/21 12:21 AM   Specimen: Nasopharyngeal Swab; Nasopharyngeal(NP) swabs in vial transport medium  Result Value Ref Range Status   SARS Coronavirus 2 by RT PCR NEGATIVE NEGATIVE Final    Comment: (NOTE) SARS-CoV-2 target nucleic acids are NOT DETECTED.  The SARS-CoV-2 RNA is generally detectable in upper respiratory specimens during the acute phase of infection. The lowest concentration of SARS-CoV-2 viral copies this assay can detect is 138 copies/mL. A negative result does not preclude SARS-Cov-2 infection and should not be used as the sole basis for treatment or other patient management decisions. A negative result may occur with  improper specimen collection/handling, submission of specimen other than nasopharyngeal swab, presence of viral mutation(s) within the areas targeted by this assay, and inadequate number of viral copies(<138 copies/mL). A negative result must be combined with clinical observations, patient history, and epidemiological information. The expected result is Negative.  Fact Sheet for Patients:  EntrepreneurPulse.com.au  Fact Sheet for Healthcare Providers:   IncredibleEmployment.be  This test is no t yet approved or cleared by the [place] FDA and  has been authorized for detection and/or diagnosis of SARS-CoV-2 by FDA under an Emergency Use Authorization (EUA). This EUA will remain  in effect (meaning this test can be used) for the duration of the COVID-19 declaration under Section 564(b)(1) of the Act, 21 U.S.C.section 360bbb-3(b)(1), unless the authorization is terminated  or revoked sooner.       Influenza A by PCR NEGATIVE NEGATIVE Final   Influenza B by PCR NEGATIVE NEGATIVE Final    Comment: (NOTE) The Xpert Xpress SARS-CoV-2/FLU/RSV plus assay is intended as an aid in the diagnosis of influenza from Nasopharyngeal swab specimens and should not be used as a sole basis for treatment. Nasal washings and aspirates are unacceptable for Xpert Xpress SARS-CoV-2/FLU/RSV testing.  Fact Sheet for Patients: EntrepreneurPulse.com.au  Fact Sheet for Healthcare Providers: IncredibleEmployment.be  This test is not yet approved or cleared by the [place] FDA and has been authorized for detection and/or diagnosis of SARS-CoV-2 by FDA under an Emergency Use Authorization (EUA). This EUA will remain in effect (meaning this test can be used) for the duration of the COVID-19 declaration under Section 564(b)(1) of the Act, 21 U.S.C. section 360bbb-3(b)(1), unless the authorization is terminated or revoked.  Performed at [facility], [place] [address] [place], [place] [address]   Blood Culture (routine x 2)     Status: Abnormal   Collection Time: 01/19/21 12:30 AM   Specimen: BLOOD RIGHT HAND  Result Value Ref Range Status   Specimen Description BLOOD RIGHT HAND  Final   Special Requests   Final    BOTTLES DRAWN AEROBIC AND ANAEROBIC Blood Culture results may not be optimal due to an inadequate volume of blood received in culture bottles   Culture  Setup Time   Final     GRAM POSITIVE COCCI IN CLUSTERS IN BOTH AEROBIC AND ANAEROBIC BOTTLES CRITICAL RESULT CALLED TO, READ BACK BY AND VERIFIED WITH: PHARMD ALEX L [id number] FCP    Culture (A)  Final    STAPHYLOCOCCUS AUREUS SUSCEPTIBILITIES PERFORMED ON PREVIOUS CULTURE WITHIN THE LAST 5 DAYS. Performed at [facility], [place] [address], [place], [place]  [address]    Report Status 01/21/2021 FINAL  Final  Blood Culture ID Panel (Reflexed)     Status: Abnormal   Collection Time: 01/19/21 12:30 AM  Result Value Ref Range Status   Enterococcus faecalis NOT DETECTED NOT DETECTED Final   Enterococcus Faecium NOT DETECTED NOT DETECTED Final   Listeria monocytogenes NOT DETECTED NOT DETECTED Final   Staphylococcus species DETECTED (A) NOT DETECTED Final    Comment: CRITICAL RESULT CALLED TO, READ BACK BY AND VERIFIED WITH: PHARMD ALEX L 1332 [id number] FCP    Staphylococcus aureus (BCID) DETECTED (A) NOT DETECTED Final    Comment: Methicillin (oxacillin)-resistant Staphylococcus aureus (MRSA). MRSA is predictably resistant to beta-lactam antibiotics (except ceftaroline). Preferred therapy is vancomycin unless clinically contraindicated. Patient requires contact precautions if  hospitalized. CRITICAL RESULT CALLED TO, READ BACK BY AND VERIFIED WITH: PHARMD ALEX L 1332 [id number] FCP    Staphylococcus epidermidis NOT DETECTED NOT DETECTED Final   Staphylococcus lugdunensis NOT DETECTED NOT DETECTED Final   Streptococcus species NOT DETECTED NOT DETECTED Final   Streptococcus agalactiae NOT DETECTED NOT DETECTED Final   Streptococcus pneumoniae NOT DETECTED NOT DETECTED Final   Streptococcus pyogenes NOT DETECTED NOT DETECTED Final   A.calcoaceticus-baumannii NOT DETECTED NOT DETECTED Final   Bacteroides fragilis NOT DETECTED NOT DETECTED Final   Enterobacterales NOT DETECTED NOT DETECTED Final   Enterobacter cloacae complex NOT DETECTED NOT DETECTED Final   Escherichia coli NOT DETECTED NOT DETECTED Final    Klebsiella aerogenes NOT DETECTED NOT DETECTED Final   Klebsiella oxytoca NOT DETECTED NOT DETECTED Final   Klebsiella pneumoniae NOT DETECTED NOT DETECTED Final   Proteus species NOT DETECTED NOT DETECTED Final   Salmonella species NOT DETECTED NOT DETECTED Final   Serratia marcescens NOT DETECTED NOT DETECTED Final   Haemophilus influenzae NOT DETECTED NOT DETECTED Final   Neisseria meningitidis NOT DETECTED NOT DETECTED Final   Pseudomonas aeruginosa NOT DETECTED NOT DETECTED Final   Stenotrophomonas maltophilia NOT DETECTED NOT DETECTED Final   Candida albicans NOT DETECTED NOT DETECTED Final   Candida auris NOT DETECTED NOT DETECTED Final   Candida glabrata NOT DETECTED NOT DETECTED Final   Candida krusei NOT DETECTED NOT DETECTED Final   Candida parapsilosis NOT DETECTED NOT DETECTED Final   Candida tropicalis NOT DETECTED NOT DETECTED Final   Cryptococcus neoformans/gattii NOT DETECTED NOT DETECTED Final   Meth resistant mecA/C and MREJ DETECTED (A) NOT DETECTED Final    Comment: CRITICAL RESULT CALLED TO, READ BACK BY AND VERIFIED WITH: PHARMD ALEX L 1332 [id number] FCP Performed at [facility], [address] [place], [place] [address]   Blood Culture (routine x 2)     Status: Abnormal   Collection Time: 01/19/21 12:34 AM   Specimen: BLOOD LEFT WRIST  Result Value Ref Range Status   Specimen Description BLOOD LEFT WRIST  Final   Special Requests   Final    BOTTLES DRAWN AEROBIC AND ANAEROBIC Blood Culture results may not be optimal due to an inadequate volume of blood received in culture bottles   Culture  Setup Time   Final    GRAM POSITIVE COCCI IN BOTH AEROBIC AND ANAEROBIC BOTTLES Performed at [facility], [place] [address] [place], [place] [address]    Culture METHICILLIN RESISTANT STAPHYLOCOCCUS AUREUS (A)  Final   Report Status 01/21/2021 FINAL  Final   Organism ID, Bacteria METHICILLIN RESISTANT STAPHYLOCOCCUS AUREUS  Final      Susceptibility    Methicillin resistant staphylococcus aureus -  MIC*    CIPROFLOXACIN >=8 RESISTANT Resistant     ERYTHROMYCIN >=8 RESISTANT Resistant     GENTAMICIN <=0.5 SENSITIVE Sensitive     OXACILLIN >=4 RESISTANT Resistant     TETRACYCLINE >=16 RESISTANT Resistant     VANCOMYCIN 1 SENSITIVE Sensitive     TRIMETH/SULFA >=320 RESISTANT Resistant     CLINDAMYCIN <=0.25 SENSITIVE Sensitive     RIFAMPIN <=0.5 SENSITIVE Sensitive     Inducible Clindamycin NEGATIVE Sensitive     * METHICILLIN RESISTANT STAPHYLOCOCCUS AUREUS  Wet prep, genital     Status: Abnormal   Collection Time: 01/19/21  5:20 AM   Specimen: PATH Cytology Cervicovaginal Ancillary Only  Result Value Ref Range Status   Yeast Wet Prep HPF POC NONE SEEN NONE SEEN Final   Trich, Wet Prep NONE SEEN NONE SEEN Final   Clue Cells Wet Prep HPF POC PRESENT (A) NONE SEEN Final   WBC, Wet Prep HPF POC MANY (A) NONE SEEN Final   Sperm NONE SEEN  Final    Comment: Performed at [facility], [place] [address], [place], [place] [address]  Culture, blood (Routine X 2) w Reflex to ID Panel     Status: Abnormal   Collection Time: 01/23/21  1:00 PM   Specimen: BLOOD RIGHT HAND  Result Value Ref Range Status   Specimen Description BLOOD RIGHT HAND  Final   Special Requests IN PEDIATRIC BOTTLE Blood Culture adequate volume  Final   Culture  Setup Time   Final    GRAM POSITIVE COCCI AEROBIC BOTTLE ONLY CRITICAL VALUE NOTED.  VALUE IS CONSISTENT WITH PREVIOUSLY REPORTED AND CALLED VALUE.    Culture (A)  Final    STAPHYLOCOCCUS AUREUS SUSCEPTIBILITIES PERFORMED ON PREVIOUS CULTURE WITHIN THE LAST 5 DAYS. Performed at [facility], [place] [address] [place], [place] [address]    Report Status 01/25/2021 FINAL  Final  Culture, blood (Routine X 2) w Reflex to ID Panel     Status: Abnormal (Preliminary result)   Collection Time: 01/24/21  6:54 AM   Specimen: BLOOD  Result Value Ref Range Status   Specimen Description BLOOD RIGHT ANTECUBITAL   Final   Special Requests   Final    AEROBIC BOTTLE ONLY Blood Culture results may not be optimal due to an inadequate volume of blood received in culture bottles   Culture  Setup Time   Final    GRAM POSITIVE COCCI AEROBIC BOTTLE ONLY CRITICAL VALUE NOTED.  VALUE IS CONSISTENT WITH PREVIOUSLY REPORTED AND CALLED VALUE.    Culture (A)  Final    STAPHYLOCOCCUS AUREUS Sent to [place] for further susceptibility testing. Performed at [facility], [place] [address], [place], [place] [address]    Report Status PENDING  Incomplete  Aerobic/Anaerobic Culture w Gram Stain (surgical/deep wound)     Status: None (Preliminary result)   Collection Time: 01/26/21 10:58 AM   Specimen: Abscess  Result Value Ref Range Status   Specimen Description ABSCESS ASPIRATE  Final   Special Requests   Final    SYMPHYSIS PUBIS Performed at [facility] [address], [place], [place] [address]    Gram Stain PENDING  Incomplete   Culture PENDING  Incomplete   Report Status PENDING  Incomplete    Janene Madeira, MSN, NP-C [facility] Cell: [phone] Pager: [phone]  @TODAY @ 1:34 PM

## 2021-01-26 NOTE — Progress Notes (Signed)
PROGRESS NOTE    Kelly [name]  ZOX:[id number] DOB: 04/25/1986 DOA: 01/19/2021 PCP: Pcp, No   Chief Complaint  Patient presents with   Abdominal Pain    Brief Narrative:  Kelly [name] is a 35 y.o. 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  Subjective: Pain better controlled after medications adjusted yesterday.  Objective: Vitals: Blood pressure 105/66, pulse (!) 104, temperature 98 F (36.7 C), temperature source Oral, resp. rate 20, height 5' 7"  (1.702 m), weight 82 kg, SpO2 94 %, unknown if currently breastfeeding.   Exam: Gen Exam:Alert awake-not in any distress HEENT:atraumatic, normocephalic Chest: B/L clear to auscultation anteriorly CVS:S1S2 regular Abdomen:soft non tender, non distended Extremities:no edema Neurology: Non focal Skin: no rash   Pertinent labs/x-rays WBC:16.6 Hb: 8.6 Na: 131 Blood culture on 9/8, 9/12 and 9/14: MRSA Blood culutre on 9/15: pending MRI pelvis on 9/13: Septic arthritis of pubic symphysis/osteomyelitis and surrounding abscesses Echo on 9/8: EF 81-19%, RV systolic function reduced.  RVSP 68.6 mmHg.  Severe TR.  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  IR performed CT-guided drainage from pubic symphysis fluid collection. Remains on Teflaro/daptomycin.  Await repeat blood cultures/pubic symphysis fluid collection cultures on 9/15   Bacterial vaginosis: On p.o. Flagyl.  Hyponatremia:  Euvolemic-improving-with fluid restriction.  Follow periodically.  Prolonged QTC: Resolved on EKG on 9/9.  Monitor in telemetry.  Monitor periodically as patient on methadone.  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    Microcytic anemia: No evidence of overt blood loss-hemoglobin stable compared to prior values.  DVT prophylaxis: Lovenox Code Status: Full Family Communication: None at bedside Disposition:   Status is: Inpatient  Remains inpatient appropriate because:IV treatments appropriate due to intensity of illness or inability to take PO  Dispo: The patient is from: Home              Anticipated d/c is to: Home              Patient currently is not medically stable to d/c.   Difficult to place patient Yes     Consultants:  [facility] Orth[facility] IR   Data Reviewed: I have personally reviewed following labs and imaging studies  CBC: Recent Labs  Lab 01/22/21 0602 01/23/21 0034 01/24/21 0653 01/25/21 0414 01/26/21 0506  WBC 26.6* 26.1* 22.9* 21.2* 16.6*  HGB 10.7* 10.4* 9.8* 10.1* 8.6*  HCT 33.3* 34.0* 32.3* 32.2* 28.9*  MCV 76.6* 78.9* 79.0* 79.5* 81.0  PLT 190 189 213 380 426*     Basic Metabolic Panel: Recent Labs  Lab 01/21/21 0021 01/22/21 0602 01/23/21 0034 01/24/21 0327 01/24/21 0653 01/25/21 0414 01/26/21 0506  NA 126* 124* 125*  --  125* 130* 131*  K 4.5 4.1 4.7  --  4.2 4.6 4.6  CL 92* 93* 94*  --  93* 95* 101  CO2 24 22 20*  --  23 21* 23  GLUCOSE 89  108* 109*  --  91 105* 96  BUN 11 10 14   --  12 14 15   CREATININE 0.55 0.52 0.57  --  0.52 0.57 0.52  CALCIUM 7.9* 7.8* 7.6*  --  8.0* 8.3* 8.2*  MG 1.9 1.7 1.8 1.7 1.7  --   --      GFR: Estimated Creatinine Clearance: 107.1 mL/min (by C-G formula based on SCr of 0.52  mg/dL).  Liver Function Tests: Recent Labs  Lab 01/26/21 0506  AST 10*  ALT <5  ALKPHOS 103  BILITOT 0.4  PROT 6.2*  ALBUMIN 1.5*     CBG: No results for input(s): GLUCAP in the last 168 hours.   Recent Results (from the past 240 hour(s))  Urine Culture     Status: Abnormal   Collection Time: 01/19/21 12:15 AM   Specimen: In/Out Cath Urine  Result Value Ref Range Status   Specimen Description IN/OUT CATH URINE  Final   Special Requests   Final    NONE Performed at [facility], [address] [place], [place] [address]    Culture (A)  Final    50,000 COLONIES/mL METHICILLIN RESISTANT STAPHYLOCOCCUS AUREUS   Report Status 01/21/2021 FINAL  Final   Organism ID, Bacteria METHICILLIN RESISTANT STAPHYLOCOCCUS AUREUS (A)  Final      Susceptibility   Methicillin resistant staphylococcus aureus - MIC*    CIPROFLOXACIN >=8 RESISTANT Resistant     GENTAMICIN <=0.5 SENSITIVE Sensitive     NITROFURANTOIN <=16 SENSITIVE Sensitive     OXACILLIN >=4 RESISTANT Resistant     TETRACYCLINE >=16 RESISTANT Resistant     VANCOMYCIN <=0.5 SENSITIVE Sensitive     TRIMETH/SULFA 160 RESISTANT Resistant     CLINDAMYCIN <=0.25 SENSITIVE Sensitive     RIFAMPIN <=0.5 SENSITIVE Sensitive     Inducible Clindamycin NEGATIVE Sensitive     * 50,000 COLONIES/mL METHICILLIN RESISTANT STAPHYLOCOCCUS AUREUS  Resp Panel by RT-PCR (Flu A&B, Covid) Nasopharyngeal Swab     Status: None   Collection Time: 01/19/21 12:21 AM   Specimen: Nasopharyngeal Swab; Nasopharyngeal(NP) swabs in vial transport medium  Result Value Ref Range Status   SARS Coronavirus 2 by RT PCR NEGATIVE NEGATIVE Final    Comment: (NOTE) SARS-CoV-2 target nucleic acids are NOT DETECTED.  The SARS-CoV-2 RNA is generally detectable in upper respiratory specimens during the acute phase of infection. The lowest concentration of SARS-CoV-2 viral copies this assay can detect is 138 copies/mL. A negative result does not preclude  SARS-Cov-2 infection and should not be used as the sole basis for treatment or other patient management decisions. A negative result may occur with  improper specimen collection/handling, submission of specimen other than nasopharyngeal swab, presence of viral mutation(s) within the areas targeted by this assay, and inadequate number of viral copies(<138 copies/mL). A negative result must be combined with clinical observations, patient history, and epidemiological information. The expected result is Negative.  Fact Sheet for Patients:  EntrepreneurPulse.com.au  Fact Sheet for Healthcare Providers:  IncredibleEmployment.be  This test is no t yet approved or cleared by the [place] FDA and  has been authorized for detection and/or diagnosis of SARS-CoV-2 by FDA under an Emergency Use Authorization (EUA). This EUA will remain  in effect (meaning this test can be used) for the duration of the COVID-19 declaration under Section 564(b)(1) of the Act, 21 U.S.C.section 360bbb-3(b)(1), unless the authorization is terminated  or revoked sooner.       Influenza A by PCR NEGATIVE NEGATIVE Final  Influenza B by PCR NEGATIVE NEGATIVE Final    Comment: (NOTE) The Xpert Xpress SARS-CoV-2/FLU/RSV plus assay is intended as an aid in the diagnosis of influenza from Nasopharyngeal swab specimens and should not be used as a sole basis for treatment. Nasal washings and aspirates are unacceptable for Xpert Xpress SARS-CoV-2/FLU/RSV testing.  Fact Sheet for Patients: EntrepreneurPulse.com.au  Fact Sheet for Healthcare Providers: IncredibleEmployment.be  This test is not yet approved or cleared by the [place] FDA and has been authorized for detection and/or diagnosis of SARS-CoV-2 by FDA under an Emergency Use Authorization (EUA). This EUA will remain in effect (meaning this test can be used) for the duration of  the COVID-19 declaration under Section 564(b)(1) of the Act, 21 U.S.C. section 360bbb-3(b)(1), unless the authorization is terminated or revoked.  Performed at [facility], [place] [address], [place], [place] [address]   Blood Culture (routine x 2)     Status: Abnormal   Collection Time: 01/19/21 12:30 AM   Specimen: BLOOD RIGHT HAND  Result Value Ref Range Status   Specimen Description BLOOD RIGHT HAND  Final   Special Requests   Final    BOTTLES DRAWN AEROBIC AND ANAEROBIC Blood Culture results may not be optimal due to an inadequate volume of blood received in culture bottles   Culture  Setup Time   Final    GRAM POSITIVE COCCI IN CLUSTERS IN BOTH AEROBIC AND ANAEROBIC BOTTLES CRITICAL RESULT CALLED TO, READ BACK BY AND VERIFIED WITH: PHARMD ALEX L [id number] FCP    Culture (A)  Final    STAPHYLOCOCCUS AUREUS SUSCEPTIBILITIES PERFORMED ON PREVIOUS CULTURE WITHIN THE LAST 5 DAYS. Performed at [facility], [place] [address], [place], [place] [address]    Report Status 01/21/2021 FINAL  Final  Blood Culture ID Panel (Reflexed)     Status: Abnormal   Collection Time: 01/19/21 12:30 AM  Result Value Ref Range Status   Enterococcus faecalis NOT DETECTED NOT DETECTED Final   Enterococcus Faecium NOT DETECTED NOT DETECTED Final   Listeria monocytogenes NOT DETECTED NOT DETECTED Final   Staphylococcus species DETECTED (A) NOT DETECTED Final    Comment: CRITICAL RESULT CALLED TO, READ BACK BY AND VERIFIED WITH: PHARMD ALEX L [id number] FCP    Staphylococcus aureus (BCID) DETECTED (A) NOT DETECTED Final    Comment: Methicillin (oxacillin)-resistant Staphylococcus aureus (MRSA). MRSA is predictably resistant to beta-lactam antibiotics (except ceftaroline). Preferred therapy is vancomycin unless clinically contraindicated. Patient requires contact precautions if  hospitalized. CRITICAL RESULT CALLED TO, READ BACK BY AND VERIFIED WITH: PHARMD ALEX L 1332 [id number] FCP     Staphylococcus epidermidis NOT DETECTED NOT DETECTED Final   Staphylococcus lugdunensis NOT DETECTED NOT DETECTED Final   Streptococcus species NOT DETECTED NOT DETECTED Final   Streptococcus agalactiae NOT DETECTED NOT DETECTED Final   Streptococcus pneumoniae NOT DETECTED NOT DETECTED Final   Streptococcus pyogenes NOT DETECTED NOT DETECTED Final   A.calcoaceticus-baumannii NOT DETECTED NOT DETECTED Final   Bacteroides fragilis NOT DETECTED NOT DETECTED Final   Enterobacterales NOT DETECTED NOT DETECTED Final   Enterobacter cloacae complex NOT DETECTED NOT DETECTED Final   Escherichia coli NOT DETECTED NOT DETECTED Final   Klebsiella aerogenes NOT DETECTED NOT DETECTED Final   Klebsiella oxytoca NOT DETECTED NOT DETECTED Final   Klebsiella pneumoniae NOT DETECTED NOT DETECTED Final   Proteus species NOT DETECTED NOT DETECTED Final   Salmonella species NOT DETECTED NOT DETECTED Final   Serratia marcescens NOT DETECTED NOT DETECTED  Final   Haemophilus influenzae NOT DETECTED NOT DETECTED Final   Neisseria meningitidis NOT DETECTED NOT DETECTED Final   Pseudomonas aeruginosa NOT DETECTED NOT DETECTED Final   Stenotrophomonas maltophilia NOT DETECTED NOT DETECTED Final   Candida albicans NOT DETECTED NOT DETECTED Final   Candida auris NOT DETECTED NOT DETECTED Final   Candida glabrata NOT DETECTED NOT DETECTED Final   Candida krusei NOT DETECTED NOT DETECTED Final   Candida parapsilosis NOT DETECTED NOT DETECTED Final   Candida tropicalis NOT DETECTED NOT DETECTED Final   Cryptococcus neoformans/gattii NOT DETECTED NOT DETECTED Final   Meth resistant mecA/C and MREJ DETECTED (A) NOT DETECTED Final    Comment: CRITICAL RESULT CALLED TO, READ BACK BY AND VERIFIED WITH: PHARMD ALEX L [phone] [id number] FCP Performed at [facility], [place] [address], [place], [place] [address]   Blood Culture (routine x 2)     Status: Abnormal   Collection Time: 01/19/21 12:34 AM   Specimen: BLOOD LEFT  WRIST  Result Value Ref Range Status   Specimen Description BLOOD LEFT WRIST  Final   Special Requests   Final    BOTTLES DRAWN AEROBIC AND ANAEROBIC Blood Culture results may not be optimal due to an inadequate volume of blood received in culture bottles   Culture  Setup Time   Final    GRAM POSITIVE COCCI IN BOTH AEROBIC AND ANAEROBIC BOTTLES Performed at [facility], [place] [address] [place], [place] [address]    Culture METHICILLIN RESISTANT STAPHYLOCOCCUS AUREUS (A)  Final   Report Status 01/21/2021 FINAL  Final   Organism ID, Bacteria METHICILLIN RESISTANT STAPHYLOCOCCUS AUREUS  Final      Susceptibility   Methicillin resistant staphylococcus aureus - MIC*    CIPROFLOXACIN >=8 RESISTANT Resistant     ERYTHROMYCIN >=8 RESISTANT Resistant     GENTAMICIN <=0.5 SENSITIVE Sensitive     OXACILLIN >=4 RESISTANT Resistant     TETRACYCLINE >=16 RESISTANT Resistant     VANCOMYCIN 1 SENSITIVE Sensitive     TRIMETH/SULFA >=320 RESISTANT Resistant     CLINDAMYCIN <=0.25 SENSITIVE Sensitive     RIFAMPIN <=0.5 SENSITIVE Sensitive     Inducible Clindamycin NEGATIVE Sensitive     * METHICILLIN RESISTANT STAPHYLOCOCCUS AUREUS  Wet prep, genital     Status: Abnormal   Collection Time: 01/19/21  5:20 AM   Specimen: PATH Cytology Cervicovaginal Ancillary Only  Result Value Ref Range Status   Yeast Wet Prep HPF POC NONE SEEN NONE SEEN Final   Trich, Wet Prep NONE SEEN NONE SEEN Final   Clue Cells Wet Prep HPF POC PRESENT (A) NONE SEEN Final   WBC, Wet Prep HPF POC MANY (A) NONE SEEN Final   Sperm NONE SEEN  Final    Comment: Performed at [facility], [place] [address] [place], [place] [address]  Culture, blood (Routine X 2) w Reflex to ID Panel     Status: Abnormal   Collection Time: 01/23/21  1:00 PM   Specimen: BLOOD RIGHT HAND  Result Value Ref Range Status   Specimen Description BLOOD RIGHT HAND  Final   Special Requests IN PEDIATRIC BOTTLE Blood Culture adequate volume  Final    Culture  Setup Time   Final    GRAM POSITIVE COCCI AEROBIC BOTTLE ONLY CRITICAL VALUE NOTED.  VALUE IS CONSISTENT WITH PREVIOUSLY REPORTED AND CALLED VALUE.    Culture (A)  Final    STAPHYLOCOCCUS AUREUS SUSCEPTIBILITIES PERFORMED ON PREVIOUS CULTURE WITHIN THE LAST  5 DAYS. Performed at [facility], [place] [address], [place], [place] [address]    Report Status 01/25/2021 FINAL  Final  Culture, blood (Routine X 2) w Reflex to ID Panel     Status: Abnormal (Preliminary result)   Collection Time: 01/24/21  6:54 AM   Specimen: BLOOD  Result Value Ref Range Status   Specimen Description BLOOD RIGHT ANTECUBITAL  Final   Special Requests   Final    AEROBIC BOTTLE ONLY Blood Culture results may not be optimal due to an inadequate volume of blood received in culture bottles   Culture  Setup Time   Final    GRAM POSITIVE COCCI AEROBIC BOTTLE ONLY CRITICAL VALUE NOTED.  VALUE IS CONSISTENT WITH PREVIOUSLY REPORTED AND CALLED VALUE.    Culture (A)  Final    STAPHYLOCOCCUS AUREUS Sent to [place] for further susceptibility testing. Performed at [facility], [place] [address] [place], [place] [address]    Report Status PENDING  Incomplete          Radiology Studies: MR PELVIS W WO CONTRAST  Result Date: 01/24/2021 CLINICAL DATA:  History of polysubstance IV drug abuse with recurrent history of endocarditis. 3-4 day history of pelvic pain. EXAM: MRI PELVIS WITHOUT AND WITH CONTRAST TECHNIQUE: Multiplanar multisequence MR imaging of the pelvis was performed both before and after administration of intravenous contrast. CONTRAST:  8.57m GADAVIST GADOBUTROL 1 MMOL/ML IV SOLN COMPARISON:  CT scan 01/19/2021 FINDINGS: Urinary Tract:  There is a Foley catheter in a decompressed bladder. Bowel: The rectum, sigmoid colon and visualized small-bowel loops are grossly. Vascular/Lymphatic: The major vascular structures appear patent. Scattered pelvic and inguinal lymphadenopathy, likely  reactive. Reproductive:  The uterus and ovaries. Other:  Small amount of free pelvic fluid is noted. Musculoskeletal: There are findings septic arthritis noted at the pubic symphysis with osteomyelitis involving the pubic bones. There is also adjacent abscess is both anterior and posterior to the pubic symphysis. There is also severe myofasciitis involving the adjacent adductor muscles along with areas of pyomyositis. The hips are normally located. I do not see any findings for septic arthritis involving the hips or the SI joints. IMPRESSION: 1. 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 2. Severe myofasciitis involving the adjacent adductor muscles along with areas of pyomyositis. 3. No findings for septic arthritis involving the hips or SI joints. 4. Free pelvic fluid but no intrapelvic abscess. Electronically Signed   By: PMarijo SanesM.D.   On: 01/24/2021 21:50   CT ASPIRATION  Result Date: 01/26/2021 CLINICAL DATA:  septic arthritis at the pubic symphysis with osteomyelitis involving the pubic bones and abscesses surrounding the pubic bones. EXAM: CT GUIDED ASPIRATION BIOPSY OF PUBIC SYMPHYSIS ANESTHESIA/SEDATION: Intravenous Fentanyl 1037m and Versed 1m51mere administered as conscious sedation during continuous monitoring of the patient's level of consciousness and physiological / cardiorespiratory status by the radiology RN, with a total moderate sedation time of 12 minutes. PROCEDURE: The procedure risks, benefits, and alternatives were explained to the patient. Questions regarding the procedure were encouraged and answered. The patient understands and consents to the procedure. Select axial scans through the pelvis obtained. Pubic symphysis localized and an appropriate skin entry site determined and marked. The operative field was prepped with chlorhexidinein a sterile fashion, and a  sterile drape was applied covering the operative field. A sterile gown and sterile gloves were used for the procedure. Local  anesthesia was provided with 1% Lidocaine. Under CT fluoroscopic guidance, 17 gauge percutaneous entry needle advanced to the pubic symphysis. Aspiration returned less than 2 mL bloody purulent material, sent for the requested laboratory studies. The patient tolerated the procedure well. COMPLICATIONS: None immediate FINDINGS: Pubic symphysis and surrounding soft tissue changes localized. Needle aspiration sample obtained without complication. IMPRESSION: 1. Technically successful CT-guided aspiration, pubic symphysis. Electronically Signed   By: Lucrezia Europe M.D.   On: 01/26/2021 11:02        Scheduled Meds:  Chlorhexidine Gluconate Cloth  6 each Topical Q0600   enoxaparin (LOVENOX) injection  40 mg Subcutaneous Q24H   lidocaine       methadone  60 mg Oral Daily   polyethylene glycol  17 g Oral Daily   senna-docusate  1 tablet Oral BID   Continuous Infusions:  sodium chloride Stopped (01/26/21 0524)   ceFTAROline (TEFLARO) IV 100 mL/hr at 01/26/21 0600   DAPTOmycin (CUBICIN)  IV Stopped (01/25/21 2057)     LOS: 7 days        Oren Binet, MD [facility]   To contact the attending provider between 7A-7P or the covering provider during after hours 7P-7A, please log into the web site [email or web address] and access using universal [place] password for that web site. If you do not have the password, please call the hospital operator.  01/26/2021, 12:30 PM

## 2021-01-26 NOTE — Procedures (Signed)
  Procedure: CT aspiration symphysis pubis fluid colletion <58m, sent for GS, C&S EBL:   minimal Complications:  none immediate  See full dictation in [facility]  DDillard CannonMD Main # [phone]  [phone]

## 2021-01-26 NOTE — H&P (Signed)
Chief Complaint: Patient was seen in consultation today for septic arthritis of the pubic symphysis with osteomyelitis involving pubic bones with aspiration of abscess.  Referring Physician(s): Nena Alexander, MD  Supervising Physician: Arne Cleveland  Patient Status: [name] - In-pt  History of Present Illness: Kelly [name] is a 35 y.o. 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  Patient presented with 3 to 4-day history of pelvic pain.  Patient was found to have sepsis secondary to MRSA bacteremia and osteomyelitis/abscess involving the pubic symphysis.  Patient has been referred by Dr. Sloan Leiter for aspiration of pubic bone abscess.  MR pelvis 01/24/21:  IMPRESSION: 1. 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 2. Severe myofasciitis involving the adjacent adductor muscles along with areas of pyomyositis. 3. No findings for septic arthritis involving the hips or SI joints. 4. Free pelvic fluid but no intrapelvic abscess.   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 12/05/2017   Abscess of breast    rt breast   Abscess of right breast 07/13/2014   Acute cystitis without hematuria    ADD (attention deficit disorder)    Back pain    Bacteremia due to Enterococcus 06/15/2018   Bipolar 1 disorder (HCC)    Endocarditis 2020   Foreign body granuloma of soft tissue, NEC, right upper arm    History of hepatitis C 10/01/2018   Hypertension    Infection of left wrist ([place]) 10/26/2020   Influenza B 06/17/2018   Leukocytosis    Multifocal pneumonia 06/14/2018   Polysubstance abuse ([place])    Pyelonephritis affecting pregnancy 05/24/2016   Sepsis ([place]) 12/02/2017   Suicidal overdose ([place])    Thigh abscess 10/26/2020   Vaginal discharge 10/26/2020     Past Surgical History:  Procedure Laterality Date   BACK SURGERY     scoliosis-in care everywhere 08/1999   BUBBLE STUDY  11/01/2020   Procedure: BUBBLE STUDY;  Surgeon: Donato Heinz, MD;  Location: [place];  Service: Cardiovascular;;   CESAREAN SECTION     CHOLECYSTECTOMY  March 2015   FOREIGN BODY REMOVAL Right 05/28/2016   Procedure: REMOVAL FOREIGN BODY RIGHT ARM;  Surgeon: Meredith Pel, MD;  Location: [place];  Service: Orthopedics;  Laterality: Right;   I & D EXTREMITY Left 10/26/2020   Procedure: IRRIGATION AND DEBRIDEMENT LEFT WRIST;  Surgeon: Iran Planas, MD;  Location: [place];  Service: Orthopedics;  Laterality: Left;   INCISION AND DRAINAGE ABSCESS Right 11/01/2020   Procedure: INCISION AND DRAINAGE ABSCESS RIGHT THIGH X 2;  Surgeon: Ileana Roup, MD;  Location: [place];  Service: General;  Laterality: Right;   IRRIGATION AND DEBRIDEMENT ABSCESS Right 07/13/2014   Procedure: IRRIGATION AND DEBRIDEMEN TRIGHT BREAST ABSCESS;  Surgeon: Donnie Mesa, MD;  Location: [place];  Service: General;  Laterality: Right;   LUMBAR LAMINECTOMY/DECOMPRESSION MICRODISCECTOMY N/A 12/05/2017   Procedure: LUMBAR LAMINECTOMY FOR EPIDURAL ABSCESS;  Surgeon: Newman Pies, MD;  Location: [place];  Service: Neurosurgery;  Laterality: N/A;   MINOR IRRIGATION AND DEBRIDEMENT OF WOUND Left 10/26/2020   Procedure: MINOR IRRIGATION AND DEBRIDEMENT OF LEFT BREAST AND BILATERAL THIGHS;  Surgeon: Iran Planas, MD;  Location: [place];  Service: Orthopedics;  Laterality: Left;   MULTIPLE EXTRACTIONS WITH ALVEOLOPLASTY Bilateral 07/03/2018   Procedure: Extraction  of tooth #'s 30 and 31 with alveoloplasty;  Surgeon: Lenn Cal, DDS;  Location: [place];  Service: Oral Surgery;  Laterality: Bilateral;   RADIOLOGY WITH ANESTHESIA N/A 12/05/2017   Procedure: MRI WITH ANESTHESIA OF [facility] AND LUMBAR SPINE WITH AND WITHOUT;  Surgeon: Radiologist, Medication, MD;  Location: [place];  Service:  Radiology;  Laterality: N/A;   RIGHT/LEFT HEART CATH AND CORONARY ANGIOGRAPHY N/A 05/21/2019   Procedure: RIGHT/LEFT HEART CATH AND CORONARY ANGIOGRAPHY;  Surgeon: Jolaine Artist, MD;  Location: [place] [facility];  Service: Cardiovascular;  Laterality: N/A;   TEE WITHOUT CARDIOVERSION N/A 05/21/2019   Procedure: TRANSESOPHAGEAL ECHOCARDIOGRAM (TEE);  Surgeon: Jolaine Artist, MD;  Location: [facility] END[facility];  Service: Cardiovascular;  Laterality: N/A;   TEE WITHOUT CARDIOVERSION N/A 11/01/2020   Procedure: TRANSESOPHAGEAL ECHOCARDIOGRAM (TEE);  Surgeon: Donato Heinz, MD;  Location: [facility] ENDOSCOPY;  Service: Cardiovascular;  Laterality: N/A;   TUBAL LIGATION Bilateral 09/30/2020   Procedure: POST PARTUM TUBAL LIGATION;  Surgeon: Osborne Oman, MD;  Location: [facility];  Service: Gynecology;  Laterality: Bilateral;    Allergies: Hydrocodone and Morphine and related  Medications: Prior to Admission medications   Medication Sig Start Date End Date Taking? Authorizing Provider  furosemide (LASIX) 40 MG tablet Take 1 tablet (40 mg total) by mouth daily. Patient not taking: Reported on 01/21/2021 08/26/20   Aletha Halim, MD  METHADONE HCL DISKETS PO Take 130 mg by mouth in the morning. Patient not taking: Reported on 01/20/2021    [provider]  NIFEdipine (ADALAT CC) 30 MG 24 hr tablet Take 1 tablet (30 mg total) by mouth daily. Patient not taking: Reported on 01/21/2021 10/05/20   Woodroe Mode, MD  prenatal vitamin w/FE, FA (NATACHEW) 29-1 MG CHEW chewable tablet Chew 1 tablet by mouth daily at 12 noon. Patient not taking: Reported on 01/21/2021 11/04/20   Thurnell Lose, MD  sertraline (ZOLOFT) 50 MG tablet Take 1 tablet (50 mg total) by mouth daily. Patient not taking: Reported on 01/21/2021 08/26/20   Aletha Halim, MD     Family History  Problem Relation Age of Onset   Diabetes Mother    Diabetes Father    Hypertension Father    Heart attack Neg Hx     Hyperlipidemia Neg Hx    Sudden death Neg Hx     Social History   Socioeconomic History   Marital status: Single    Spouse name: Not on file   Number of children: Not on file   Years of education: Not on file   Highest education level: Not on file  Occupational History   Occupation: unemployed  Tobacco Use   Smoking status: Former    Packs/day: 1.00    Years: 13.00    Pack years: 13.00    Types: Cigarettes    Quit date: 06/13/2018    Years since quitting: 2.6   Smokeless tobacco: Never  Vaping Use   Vaping Use: Never used  Substance and Sexual Activity   Alcohol use: Not Currently    Comment: one or two drinks weekly   Drug use: Yes    Types: Cocaine, Heroin, IV, Fentanyl    Comment: heroin, crack cocaine, fentanyl last used today (07/13/20)    Sexual activity: Yes    Birth control/protection: Condom  Other Topics Concern   Not on file  Social History Narrative   Not on file   Social Determinants of Health   Financial Resource Strain: High  Risk   Difficulty of Paying Living Expenses: Very hard  Food Insecurity: No Food Insecurity   Worried About Charity fundraiser in the Last Year: Never true   Ran Out of Food in the Last Year: Never true  Transportation Needs: Unmet Transportation Needs   Lack of Transportation (Medical): Yes   Lack of Transportation (Non-Medical): Yes  Physical Activity: Not on file  Stress: Not on file  Social Connections: Not on file    Review of Systems: A 12 point ROS discussed and pertinent positives are indicated in the HPI above.  All other systems are negative.  Review of Systems  Constitutional:  Negative for chills and fever.  Respiratory:  Negative for shortness of breath.   Cardiovascular:  Negative for chest pain.  Gastrointestinal:  Negative for nausea and vomiting.  Genitourinary:  Positive for pelvic pain.   Vital Signs: BP 131/87 (BP Location: Right Arm)   Pulse 99   Temp 98.2 F (36.8 C) (Oral)   Resp (!) 21   Ht  5' 7"  (1.702 m)   Wt 180 lb 12.4 oz (82 kg)   SpO2 97%   BMI 28.31 kg/m   Physical Exam Constitutional:      Appearance: She is not ill-appearing.  HENT:     Head: Normocephalic.     Mouth/Throat:     Mouth: Mucous membranes are moist.     Pharynx: Oropharynx is clear.  Cardiovascular:     Rate and Rhythm: Normal rate and regular rhythm.  Pulmonary:     Effort: Pulmonary effort is normal.     Breath sounds: Normal breath sounds. No wheezing, rhonchi or rales.  Abdominal:     General: Bowel sounds are normal. There is no distension.     Palpations: Abdomen is soft.     Tenderness: There is no abdominal tenderness.  Skin:    General: Skin is warm and dry.  Neurological:     General: No focal deficit present.     Mental Status: She is alert and oriented to person, place, and time.  Psychiatric:        Mood and Affect: Mood normal.        Behavior: Behavior normal.    Imaging: MR THORACIC SPINE WO CONTRAST  Result Date: 01/19/2021 CLINICAL DATA:  Mid back pain lower abdominal pain EXAM: MRI THORACIC AND LUMBAR SPINE WITHOUT CONTRAST TECHNIQUE: Multiplanar and multiecho pulse sequences of the thoracic and lumbar spine were obtained without intravenous contrast. COMPARISON:  CT abdomen/pelvis obtained earlier the same day MRI lumbar spine 08/08/2020 FINDINGS: MRI THORACIC SPINE FINDINGS The patient is status post posterior instrumented fusion from T4 through L3 with posterior decompression at L3-L4 through L5-S1. Artifact from the hardware markedly degrades evaluation of the canal and surrounding structures. Alignment: There is slight levoscoliosis of the thoracic spine centered at T8. Sagittal alignment appears grossly within expected limits. Vertebrae: Vertebral body heights are preserved. No marrow signal abnormality is identified. Cord:  The cord is not well evaluated. Paraspinal and other soft tissues: The paraspinal soft tissues are unremarkable. There are multiple cavitary  appearing lesions in the lungs bilaterally. Disc levels: The canal and neural foramina below the T2-T3 level are not well evaluated. The levels above T2-T3 are normal. MRI LUMBAR SPINE FINDINGS Segmentation:  Standard. Alignment: There is levoscoliosis of the lumbar spine centered at L2-L3. Sagittal alignment appears normal. Vertebrae: Vertebral body heights are preserved. A focus of T2 hyperintensity in the L5 vertebral body is unchanged and  may reflect a small hemangioma. There is no other marrow signal abnormality. Conus medullaris and cauda equina: The canal above the L3 level is not well evaluated. The conus is not identified. Paraspinal and other soft tissues: The paraspinal soft tissues are unremarkable. Disc levels: The spinal canal and neural foramina at L2-L3 and above are not well evaluated. There is disc desiccation and narrowing at L4-L5 and L5-S1, similar to the prior study. L3-L4: There are postsurgical changes reflecting posterior decompression. No definite high-grade spinal canal or neural foraminal stenosis. L4-L5: There are postsurgical changes reflecting posterior decompression. There is a mild disc bulge and bilateral facet arthropathy resulting in crowding of the left subarticular zone without evidence of nerve root impingement and mild to moderate left and mild right neural foraminal stenosis, not significantly changed. L5-S1: There are postsurgical changes reflecting posterior disc compression. There is a left foraminal disc protrusion and bilateral facet arthropathy resulting in moderate left neural foraminal stenosis with contact of the exiting left L5 nerve root. There is no significant spinal canal or right neural foraminal stenosis. The disc protrusion appears slightly worsened compared to the prior study. IMPRESSION: 1. Postsurgical changes reflecting posterior instrumented fusion extending from T4 through L3 and posterior decompression at L3-L4 through L5-S1. Artifact from the hardware  significantly degrades evaluation of the canal and adjacent structures at the surgical levels. 2. Left foraminal disc protrusion and facet arthropathy at L5-S1 result in moderate left neural foraminal stenosis with contact of the exiting L5 nerve root, slightly worsened since 08/08/2020. 3. No other definite high-grade spinal canal or neural foraminal stenosis in the thoracolumbar spine, within the above confines. 4. No definite marrow signal abnormality to suggest infection. 5. Cavitary appearing lesions in the lungs as seen on the same-day CT abdomen/pelvis. Recommend dedicated imaging of the chest. Electronically Signed   By: Valetta Mole M.D.   On: 01/19/2021 08:55   MR LUMBAR SPINE WO CONTRAST  Result Date: 01/19/2021 CLINICAL DATA:  Mid back pain lower abdominal pain EXAM: MRI THORACIC AND LUMBAR SPINE WITHOUT CONTRAST TECHNIQUE: Multiplanar and multiecho pulse sequences of the thoracic and lumbar spine were obtained without intravenous contrast. COMPARISON:  CT abdomen/pelvis obtained earlier the same day MRI lumbar spine 08/08/2020 FINDINGS: MRI THORACIC SPINE FINDINGS The patient is status post posterior instrumented fusion from T4 through L3 with posterior decompression at L3-L4 through L5-S1. Artifact from the hardware markedly degrades evaluation of the canal and surrounding structures. Alignment: There is slight levoscoliosis of the thoracic spine centered at T8. Sagittal alignment appears grossly within expected limits. Vertebrae: Vertebral body heights are preserved. No marrow signal abnormality is identified. Cord:  The cord is not well evaluated. Paraspinal and other soft tissues: The paraspinal soft tissues are unremarkable. There are multiple cavitary appearing lesions in the lungs bilaterally. Disc levels: The canal and neural foramina below the T2-T3 level are not well evaluated. The levels above T2-T3 are normal. MRI LUMBAR SPINE FINDINGS Segmentation:  Standard. Alignment: There is  levoscoliosis of the lumbar spine centered at L2-L3. Sagittal alignment appears normal. Vertebrae: Vertebral body heights are preserved. A focus of T2 hyperintensity in the L5 vertebral body is unchanged and may reflect a small hemangioma. There is no other marrow signal abnormality. Conus medullaris and cauda equina: The canal above the L3 level is not well evaluated. The conus is not identified. Paraspinal and other soft tissues: The paraspinal soft tissues are unremarkable. Disc levels: The spinal canal and neural foramina at L2-L3 and above are not well  evaluated. There is disc desiccation and narrowing at L4-L5 and L5-S1, similar to the prior study. L3-L4: There are postsurgical changes reflecting posterior decompression. No definite high-grade spinal canal or neural foraminal stenosis. L4-L5: There are postsurgical changes reflecting posterior decompression. There is a mild disc bulge and bilateral facet arthropathy resulting in crowding of the left subarticular zone without evidence of nerve root impingement and mild to moderate left and mild right neural foraminal stenosis, not significantly changed. L5-S1: There are postsurgical changes reflecting posterior disc compression. There is a left foraminal disc protrusion and bilateral facet arthropathy resulting in moderate left neural foraminal stenosis with contact of the exiting left L5 nerve root. There is no significant spinal canal or right neural foraminal stenosis. The disc protrusion appears slightly worsened compared to the prior study. IMPRESSION: 1. Postsurgical changes reflecting posterior instrumented fusion extending from T4 through L3 and posterior decompression at L3-L4 through L5-S1. Artifact from the hardware significantly degrades evaluation of the canal and adjacent structures at the surgical levels. 2. Left foraminal disc protrusion and facet arthropathy at L5-S1 result in moderate left neural foraminal stenosis with contact of the exiting  L5 nerve root, slightly worsened since 08/08/2020. 3. No other definite high-grade spinal canal or neural foraminal stenosis in the thoracolumbar spine, within the above confines. 4. No definite marrow signal abnormality to suggest infection. 5. Cavitary appearing lesions in the lungs as seen on the same-day CT abdomen/pelvis. Recommend dedicated imaging of the chest. Electronically Signed   By: Valetta Mole M.D.   On: 01/19/2021 08:55   MR PELVIS W WO CONTRAST  Result Date: 01/24/2021 CLINICAL DATA:  History of polysubstance IV drug abuse with recurrent history of endocarditis. 3-4 day history of pelvic pain. EXAM: MRI PELVIS WITHOUT AND WITH CONTRAST TECHNIQUE: Multiplanar multisequence MR imaging of the pelvis was performed both before and after administration of intravenous contrast. CONTRAST:  8.7m GADAVIST GADOBUTROL 1 MMOL/ML IV SOLN COMPARISON:  CT scan 01/19/2021 FINDINGS: Urinary Tract:  There is a Foley catheter in a decompressed bladder. Bowel: The rectum, sigmoid colon and visualized small-bowel loops are grossly. Vascular/Lymphatic: The major vascular structures appear patent. Scattered pelvic and inguinal lymphadenopathy, likely reactive. Reproductive:  The uterus and ovaries. Other:  Small amount of free pelvic fluid is noted. Musculoskeletal: There are findings septic arthritis noted at the pubic symphysis with osteomyelitis involving the pubic bones. There is also adjacent abscess is both anterior and posterior to the pubic symphysis. There is also severe myofasciitis involving the adjacent adductor muscles along with areas of pyomyositis. The hips are normally located. I do not see any findings for septic arthritis involving the hips or the SI joints. IMPRESSION: 1. 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 2. Severe myofasciitis involving the adjacent adductor muscles along with  areas of pyomyositis. 3. No findings for septic arthritis involving the hips or SI joints. 4. Free pelvic fluid but no intrapelvic abscess. Electronically Signed   By: PMarijo SanesM.D.   On: 01/24/2021 21:50   CT Abdomen Pelvis W Contrast  Result Date: 01/19/2021 CLINICAL DATA:  Abdominal pain, fever, intravenous drug use, urinary retention EXAM: CT ABDOMEN AND PELVIS WITH CONTRAST TECHNIQUE: Multidetector CT imaging of the abdomen and pelvis was performed using the standard protocol following bolus administration of intravenous contrast. CONTRAST:  819mOMNIPAQUE IOHEXOL 350 MG/ML SOLN COMPARISON:  01/15/2019 FINDINGS: Lower chest: Innumerable pulmonary nodules are seen within the  visualized lung bases several of which appear cavitary and are suspicious for septic emboli in this patient. No pleural effusion. Cardiac size is mildly enlarged with particular enlargement of the right heart chambers. No pericardial effusion. Hepatobiliary: No focal liver abnormality is seen. Status post cholecystectomy. No biliary dilatation. Pancreas: Unremarkable Spleen: Unremarkable Adrenals/Urinary Tract: The adrenal glands are unremarkable. The kidneys are normal. Foley catheter balloon seen within a decompressed bladder lumen. Stomach/Bowel: Moderate stool seen within the colon. The stomach, small bowel, and large bowel are otherwise unremarkable. Appendix normal. No free intraperitoneal gas or fluid. Vascular/Lymphatic: No significant vascular findings are present. No enlarged abdominal or pelvic lymph nodes. Reproductive: Uterus and bilateral adnexa are unremarkable. Other: No abdominal wall hernia.  Rectum unremarkable. Musculoskeletal: Harrington rods are partially visualized within the thoracolumbar spine. Resection of the spinous processes of L3, L4, and L5 has been performed. Mild degenerative changes are seen within the lower lumbar spine. No acute bone abnormality. No lytic or blastic bone lesion. IMPRESSION:  Innumerable pulmonary nodules within the visualized lung bases several of which demonstrate cavitary change suspicious for innumerable septic emboli. Mild cardiomegaly with stable enlargement of the right heart structures. Moderate stool seen within the colon without evidence of obstruction. Electronically Signed   By: Fidela Salisbury M.D.   On: 01/19/2021 03:47   DG Chest Port 1 View  Result Date: 01/19/2021 CLINICAL DATA:  Questionable sepsis EXAM: PORTABLE CHEST 1 VIEW COMPARISON:  10/26/2020 FINDINGS: Cardiomegaly. Patchy bilateral airspace disease trauma left greater than right. Findings concerning for pneumonia. No effusions or acute bony abnormality. IMPRESSION: Patchy bilateral airspace opacities concerning for pneumonia. Electronically Signed   By: Rolm Baptise M.D.   On: 01/19/2021 00:39   DG Knee Left Port  Result Date: 01/19/2021 CLINICAL DATA:  Left knee pain, fall, IV drug use EXAM: PORTABLE LEFT KNEE - 1-2 VIEW COMPARISON:  None. FINDINGS: Normal alignment. No fracture or dislocation. Joint spaces are preserved. No effusion. Prepatellar soft tissue swelling is present. IMPRESSION: Prepatellar soft tissue swelling. Electronically Signed   By: Fidela Salisbury M.D.   On: 01/19/2021 02:21   ECHOCARDIOGRAM COMPLETE  Result Date: 01/19/2021    ECHOCARDIOGRAM REPORT   Patient Name:   Kelly [name] Date of Exam: 01/19/2021 Medical Rec #:  [id number]          Height:       67.0 in Accession #:    [id number]         Weight:       180.0 lb Date of Birth:  1985-10-29           BSA:          1.934 m Patient Age:    9 years           BP:           135/81 mmHg Patient Gender: F                  HR:           115 bpm. Exam Location:  Inpatient Procedure: 2D Echo, Cardiac Doppler and Color Doppler Indications:    Endocarditis  History:        Patient has prior history of Echocardiogram examinations, most                 recent 10/30/2020.  Sonographer:    MH Referring Phys: [id number] [place]  1.  Left ventricular ejection fraction, by estimation, is 40 to 45%. The  left ventricle has mildly decreased function. The left ventricle demonstrates global hypokinesis. There is mild left ventricular hypertrophy. Left ventricular diastolic parameters are indeterminate. There is the interventricular septum is flattened in systole and diastole, consistent with right ventricular pressure and volume overload.  2. Right ventricular systolic function is mildly reduced. The right ventricular size is severely enlarged. Mildly increased right ventricular wall thickness. There is severely elevated pulmonary artery systolic pressure. The estimated right ventricular systolic pressure is 82.9 mmHg.  3. Right atrial size was severely dilated.  4. The mitral valve is normal in structure. Trivial mitral valve regurgitation.  5. The tricuspid valve is abnormal. Tricuspid valve regurgitation is severe.  6. The aortic valve was not well visualized. Aortic valve regurgitation is not visualized. No aortic stenosis is present.  7. The inferior vena cava is dilated in size with <50% respiratory variability, suggesting right atrial pressure of 15 mmHg. Conclusion(s)/Recommendation(s): Thickened tricuspid valve leaflets, no clear vegetation seen. If clinical suspicion for endocarditis, recommend TEE. FINDINGS  Left Ventricle: Left ventricular ejection fraction, by estimation, is 40 to 45%. The left ventricle has mildly decreased function. The left ventricle demonstrates global hypokinesis. The left ventricular internal cavity size was normal in size. There is  mild left ventricular hypertrophy. The interventricular septum is flattened in systole and diastole, consistent with right ventricular pressure and volume overload. Left ventricular diastolic parameters are indeterminate. Right Ventricle: The right ventricular size is severely enlarged. Mildly increased right ventricular wall thickness. Right ventricular systolic function is mildly  reduced. There is severely elevated pulmonary artery systolic pressure. The tricuspid regurgitant velocity is 3.66 m/s, and with an assumed right atrial pressure of 15 mmHg, the estimated right ventricular systolic pressure is 56.2 mmHg. Left Atrium: Left atrial size was normal in size. Right Atrium: Right atrial size was severely dilated. Pericardium: There is no evidence of pericardial effusion. Mitral Valve: The mitral valve is normal in structure. Trivial mitral valve regurgitation. Tricuspid Valve: The tricuspid valve is abnormal. Tricuspid valve regurgitation is severe. Aortic Valve: The aortic valve was not well visualized. Aortic valve regurgitation is not visualized. No aortic stenosis is present. Aortic valve mean gradient measures 5.0 mmHg. Aortic valve peak gradient measures 8.8 mmHg. Aortic valve area, by VTI measures 3.05 cm. Pulmonic Valve: The pulmonic valve was not well visualized. Pulmonic valve regurgitation is not visualized. Aorta: The aortic root and ascending aorta are structurally normal, with no evidence of dilitation. Venous: The inferior vena cava is dilated in size with less than 50% respiratory variability, suggesting right atrial pressure of 15 mmHg. IAS/Shunts: No atrial level shunt detected by color flow Doppler.  LEFT VENTRICLE PLAX 2D LVIDd:         4.60 cm     Diastology LVIDs:         3.20 cm     LV e' medial:    13.90 cm/s LV PW:         1.30 cm     LV E/e' medial:  4.4 LV IVS:        1.30 cm     LV e' lateral:   20.70 cm/s LVOT diam:     2.30 cm     LV E/e' lateral: 3.0 LV SV:         66 LV SV Index:   34 LVOT Area:     4.15 cm  LV Volumes (MOD) LV vol d, MOD A4C: 51.2 ml LV vol s, MOD A4C: 25.6 ml LV SV MOD A4C:  51.2 ml RIGHT VENTRICLE             IVC RV S prime:     26.70 cm/s  IVC diam: 2.30 cm TAPSE (M-mode): 2.8 cm LEFT ATRIUM           Index       RIGHT ATRIUM           Index LA diam:      2.20 cm 1.14 cm/m  RA Area:     36.30 cm LA Vol (A2C): 16.1 ml 8.33 ml/m   RA Volume:   158.00 ml 81.71 ml/m LA Vol (A4C): 22.2 ml 11.48 ml/m  AORTIC VALVE                    PULMONIC VALVE AV Area (Vmax):    3.42 cm     PV Vmax:       0.86 m/s AV Area (Vmean):   3.34 cm     PV Peak grad:  3.0 mmHg AV Area (VTI):     3.05 cm AV Vmax:           148.00 cm/s AV Vmean:          107.000 cm/s AV VTI:            0.218 m AV Peak Grad:      8.8 mmHg AV Mean Grad:      5.0 mmHg LVOT Vmax:         122.00 cm/s LVOT Vmean:        85.900 cm/s LVOT VTI:          0.160 m LVOT/AV VTI ratio: 0.73  AORTA Ao Root diam: 3.20 cm Ao Asc diam:  2.50 cm MITRAL VALVE               TRICUSPID VALVE MV Area (PHT): 7.59 cm    TR Peak grad:   53.6 mmHg MV E velocity: 61.70 cm/s  TR Vmax:        366.00 cm/s MV A velocity: 78.80 cm/s MV E/A ratio:  0.78        SHUNTS                            Systemic VTI:  0.16 m                            Systemic Diam: 2.30 cm Oswaldo Milian MD Electronically signed by Oswaldo Milian MD Signature Date/Time: 01/19/2021/10:47:41 AM    Final     Labs:  CBC: Recent Labs    01/23/21 0034 01/24/21 5465 01/25/21 0414 01/26/21 0506  WBC 26.1* 22.9* 21.2* 16.6*  HGB 10.4* 9.8* 10.1* 8.6*  HCT 34.0* 32.3* 32.2* 28.9*  PLT 189 213 380 426*    COAGS: Recent Labs    01/19/21 0030  INR 1.3*  APTT 29    BMP: Recent Labs    01/23/21 0034 01/24/21 0653 01/25/21 0414 01/26/21 0506  NA 125* 125* 130* 131*  K 4.7 4.2 4.6 4.6  CL 94* 93* 95* 101  CO2 20* 23 21* 23  GLUCOSE 109* 91 105* 96  BUN 14 12 14 15   CALCIUM 7.6* 8.0* 8.3* 8.2*  CREATININE 0.57 0.52 0.57 0.52  GFRNONAA >60 >60 >60 >60    LIVER FUNCTION TESTS: Recent Labs    11/03/20 0106 11/04/20 0247 01/19/21 0030 01/26/21 0506  BILITOT 0.4  0.6 0.6 0.4  AST 11* 12* 13* 10*  ALT 5 6 9  <5  ALKPHOS 89 96 195* 103  PROT 7.0 7.1 6.2* 6.2*  ALBUMIN 2.8* 3.1* 1.8* 1.5*    TUMOR MARKERS: No results for input(s): AFPTM, CEA, CA199, CHROMGRNA in the last 8760 hours.  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  Patient presented with 3 to 4-day history of pelvic pain.  Patient was found to have sepsis secondary to MRSA bacteremia and osteomyelitis/abscess involving the pubic symphysis.  Patient has been referred by Dr. Sloan Leiter for aspiration of pubic bone abscess.  Pt states she is NPO per order. Morning lovenox held per RN. Patient is a pleasant, calm, A&O. She is in no distress.   WBC 16.6 from 21.2 yesterday INR 01/19/21 1.3 VSS   Risks and benefits discussed with the patient including bleeding, infection, damage to adjacent structures, and sepsis.  All of the patient's questions were answered, patient is agreeable to proceed. Consent signed and in chart.   Thank you for this interesting consult.  I greatly enjoyed meeting [employer] and look forward to participating in their care.  A copy of this report was sent to the requesting provider on this date.   Electronically Signed: Tyson Alias, NP 01/26/2021, 8:50 AM   I spent a total of 30 minutes in face to face in clinical consultation, greater than 50% of which was counseling/coordinating care for septic arthritis of the pubic symphysis with osteomyelitis involving pubic bones with aspiration of abscess.

## 2021-01-26 NOTE — Progress Notes (Signed)
Physical Therapy Treatment Patient Details Name: Kelly [name] MRN: [id number] DOB: 25-Jan-1986 Today's Date: 01/26/2021   History of Present Illness 35 y.o. female [facility] with pelvic pain. Pt found to have sepsis 2/2 MRSA bacteremia. +pelvic abscesses with IR drainage 9/15; septic arthritis of pubic symphysis   PMH includes HTN, systolic CHF with moderate pulmonary hypertension and severe TR, polysubstance abuse including history of IV drug use, recurrent history of endocarditis, C. difficile colitis.    PT Comments    Patient continues with significant pain limiting her ambulation (pt was pre-medicated for pain) and was able to walk 30 ft with RW today. Educated that increased mobility will help decrease her pain and pt hopeful to begin walking to bathroom with nursing.     Recommendations for follow up therapy are one component of a multi-disciplinary discharge planning process, led by the attending physician.  Recommendations may be updated based on patient status, additional functional criteria and insurance authorization.  Follow Up Recommendations  Home health PT     Equipment Recommendations  Wheelchair (measurements PT)    Recommendations for Other Services       Precautions / Restrictions Precautions Precautions: Fall Restrictions Weight Bearing Restrictions: No     Mobility  Bed Mobility Overal bed mobility: Needs Assistance Bed Mobility: Supine to Sit     Supine to sit: Mod assist;HOB elevated Sit to supine: Mod assist   General bed mobility comments: +rail, cues for sequencing, increased time, assist with BLE (moving off EOB togehter)    Transfers Overall transfer level: Needs assistance Equipment used: Rolling walker (2 wheeled) Transfers: Sit to/from Stand Sit to Stand: Min assist;From elevated surface (pt requesting bed be elevated to decr pain)         General transfer comment: cues for hand placement and sequencing  (one hand on RW, PT stabilizing other side of RW and pt's other hand pushing off bed), increased time  Ambulation/Gait Ambulation/Gait assistance: Min guard Gait Distance (Feet): 30 Feet Assistive device: Rolling walker (2 wheeled) Gait Pattern/deviations: Step-to pattern;Decreased stride length;Shuffle Gait velocity: very slow Gait velocity interpretation: <1.31 ft/sec, indicative of household ambulator General Gait Details: slow, incr reliance on bil UEs on RW   Stairs             Wheelchair Mobility    Modified Rankin (Stroke Patients Only)       Balance Overall balance assessment: Needs assistance Sitting-balance support: Feet supported;Single extremity supported;Bilateral upper extremity supported Sitting balance-Leahy Scale: Fair     Standing balance support: Bilateral upper extremity supported;During functional activity Standing balance-Leahy Scale: Poor Standing balance comment: reliant on BUE support                            Cognition Arousal/Alertness: Awake/alert Behavior During Therapy: Anxious Overall Cognitive Status: Within Functional Limits for tasks assessed                                        Exercises      General Comments General comments (skin integrity, edema, etc.): HR 100s; on RA      Pertinent Vitals/Pain Pain Assessment: Faces Faces Pain Scale: Hurts whole lot Pain Location: pelvis and LEs Pain Descriptors / Indicators: Moaning;Grimacing;Guarding Pain Intervention(s): Limited activity within patient's tolerance;Monitored during session;Premedicated before session    Home Living  Prior Function            PT Goals (current goals can now be found in the care plan section) Acute Rehab PT Goals Patient Stated Goal: to reduce pain Time For Goal Achievement: 02/04/21 Potential to Achieve Goals: Fair Progress towards PT goals: Progressing toward goals     Frequency    Min 3X/week      PT Plan Current plan remains appropriate    Co-evaluation              AM-PAC PT "6 Clicks" Mobility   Outcome Measure  Help needed turning from your back to your side while in a flat bed without using bedrails?: A Little Help needed moving from lying on your back to sitting on the side of a flat bed without using bedrails?: A Lot Help needed moving to and from a bed to a chair (including a wheelchair)?: A Lot Help needed standing up from a chair using your arms (e.g., wheelchair or bedside chair)?: A Lot Help needed to walk in hospital room?: A Little Help needed climbing 3-5 steps with a railing? : Total 6 Click Score: 13    End of Session Equipment Utilized During Treatment: Oxygen Activity Tolerance: Patient limited by pain Patient left: with call bell/phone within reach;in bed;with bed alarm set Nurse Communication: Mobility status PT Visit Diagnosis: Unsteadiness on feet (R26.81);Other abnormalities of gait and mobility (R26.89);Muscle weakness (generalized) (M62.81);Pain Pain - part of body:  (pelvis and LEs)     Time: 2395-3202 PT Time Calculation (min) (ACUTE ONLY): 33 min  Charges:  $Gait Training: 23-37 mins                      Arby Barrette, PT Pager [phone]    Rexanne Mano 01/26/2021, 3:31 PM

## 2021-01-26 NOTE — TOC Progression Note (Signed)
Transition of Care [name]) - Progression Note    Patient Details  Name: Kelly [name] MRN: [id number] Date of Birth: 1985/07/11  Transition of Care [facility]) CM/SW [place], RN Phone Number: 01/26/2021, 2:07 PM  Clinical Narrative:     Day 7 of hospitalization. Continues on IV antibiotics, will need them for 6 weeks. As previously recorded is not a candidate for home infusion therapy due to IV drug use. Will need to be on antibiotics 5 more weeks. Does not have a PICC in, waiting for resolving positive blood cultures.   Had recent drainage of fluid collection  by IR.  CM continue to follow through course of Hospitalization    Expected Discharge Plan: Home/Self Care Barriers to Discharge: Continued Medical Work up, Inadequate or no insurance  Expected Discharge Plan and Services Expected Discharge Plan: Home/Self Care In-house Referral: Clinical Social Work     Living arrangements for the past 2 months:  (Has PO box listed Mother as contact)                                       Social Determinants of Health (SDOH) Interventions    Readmission Risk Interventions Readmission Risk Prevention Plan 08/25/2020 07/30/2018  Transportation Screening Complete Complete  Transportation Screening Comment Rooms at the [facility] to provide transportation for the patient. -  PCP or Specialist Appt within 5-7 Days Complete -  Not Complete comments 08/29/20 OB/GYN appointment scheduled -  Home Care Screening Complete -  Medication Review (RN Care Manager) - Complete  PCP or Specialist appointment within 3-5 days of discharge - Complete  HRI or [place] - Not Complete  HRI or Home Care Consult Pt Refusal Comments - N/A  SW Recovery Care/Counseling Consult - Complete  Palliative Care Screening - Not Complete  Comments - N/A  Skilled Nursing Facility - Not Complete  SNF Comments - N/A  Some recent data might be hidden

## 2021-01-27 DIAGNOSIS — R188 Other ascites: Secondary | ICD-10-CM | POA: Diagnosis not present

## 2021-01-27 DIAGNOSIS — F1910 Other psychoactive substance abuse, uncomplicated: Secondary | ICD-10-CM | POA: Diagnosis not present

## 2021-01-27 DIAGNOSIS — I079 Rheumatic tricuspid valve disease, unspecified: Secondary | ICD-10-CM | POA: Diagnosis not present

## 2021-01-27 DIAGNOSIS — R7881 Bacteremia: Secondary | ICD-10-CM | POA: Diagnosis not present

## 2021-01-27 LAB — CBC
HCT: 29.8 % — ABNORMAL LOW (ref 36.0–46.0)
Hemoglobin: 9 g/dL — ABNORMAL LOW (ref 12.0–15.0)
MCH: 24.7 pg — ABNORMAL LOW (ref 26.0–34.0)
MCHC: 30.2 g/dL (ref 30.0–36.0)
MCV: 81.9 fL (ref 80.0–100.0)
Platelets: 480 10*3/uL — ABNORMAL HIGH (ref 150–400)
RBC: 3.64 MIL/uL — ABNORMAL LOW (ref 3.87–5.11)
RDW: 19.3 % — ABNORMAL HIGH (ref 11.5–15.5)
WBC: 14.3 10*3/uL — ABNORMAL HIGH (ref 4.0–10.5)
nRBC: 0 % (ref 0.0–0.2)

## 2021-01-27 LAB — C-REACTIVE PROTEIN: CRP: 10.7 mg/dL — ABNORMAL HIGH (ref <1.0)

## 2021-01-27 NOTE — Progress Notes (Addendum)
PROGRESS NOTE    Kelly [name]  SWN:[id number] DOB: March 11, 1986 DOA: 01/19/2021 PCP: Pcp, No   Chief Complaint  Patient presents with   Abdominal Pain    Brief Narrative:  Kelly [name] is a 35 y.o. 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  Subjective: No major issues overnight-still with pelvic pain-but otherwise appears stable.  Objective: Vitals: Blood pressure (!) 124/91, pulse 92, temperature 98.1 F (36.7 C), temperature source Oral, resp. rate 16, height 5' 7"  (1.702 m), weight 82 kg, SpO2 95 %, unknown if currently breastfeeding.   Exam: Gen Exam:Alert awake-not in any distress HEENT:atraumatic, normocephalic Chest: B/L clear to auscultation anteriorly CVS:S1S2 regular Abdomen:soft non tender, non distended Extremities:no edema Neurology: Non focal Skin: no rash   Pertinent labs/x-rays WBC:14.3 Hb: 9.0  Blood culture on 9/8, 9/12 and 9/14: MRSA Blood culutre on 9/15: neg Pelvic abscess cx: S.Aureus MRI pelvis on 9/13: Septic arthritis of pubic symphysis/osteomyelitis and surrounding abscesses Echo on 9/8: EF 93-81%, RV systolic function reduced.  RVSP 68.6 mmHg.  Severe TR.  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  Per ID-no benefit to repeating a echo/TEE-as it would not change management (patient not a candidate for surgery).  Continue empiric IV antibiotics per infectious disease.     Bacterial  vaginosis: Completed a course of oral Flagyl  Hyponatremia: Euvolemic-improving-with fluid restriction.  Follow periodically.  Acute urinary retention: Failed voiding trial of few days.Will need a repeat voiding trial in the next 2 days.  Prolonged QTC: Resolved on EKG on 9/9.  Monitor in telemetry.  Monitor periodically as patient on methadone.  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    Microcytic anemia: No evidence of overt blood loss-hemoglobin stable compared to prior values.  DVT prophylaxis: Lovenox Code Status: Full Family Communication: None at bedside Disposition:   Status is: Inpatient  Remains inpatient appropriate because:IV treatments appropriate due to intensity of illness or inability to take PO  Dispo: The patient is from: Home              Anticipated d/c is to: Home              Patient currently is not medically stable to d/c.   Difficult to place patient Yes     Consultants:  ID Ortho IR   Data Reviewed: I have personally reviewed following labs and imaging studies  CBC: Recent Labs  Lab 01/23/21 0034 01/24/21 0653 01/25/21 0414 01/26/21 0506 01/27/21 0521  WBC 26.1* 22.9* 21.2* 16.6* 14.3*  HGB 10.4* 9.8* 10.1* 8.6* 9.0*  HCT 34.0* 32.3* 32.2* 28.9* 29.8*  MCV 78.9* 79.0* 79.5* 81.0 81.9  PLT 189 213 380 426* 480*     Basic Metabolic Panel: Recent Labs  Lab 01/21/21 0021 01/22/21 0602 01/23/21 0034 01/24/21 0327 01/24/21 0653 01/25/21 0414 01/26/21 0506  NA 126* 124* 125*  --  125*  130* 131*  K 4.5 4.1 4.7  --  4.2 4.6 4.6  CL 92* 93* 94*  --  93* 95* 101  CO2 24 22 20*  --  23 21* 23  GLUCOSE 89 108* 109*  --  91 105* 96  BUN 11 10 14   --  12 14 15   CREATININE 0.55 0.52 0.57  --  0.52 0.57 0.52  CALCIUM 7.9*  7.8* 7.6*  --  8.0* 8.3* 8.2*  MG 1.9 1.7 1.8 1.7 1.7  --   --      GFR: Estimated Creatinine Clearance: 107.1 mL/min (by C-G formula based on SCr of 0.52 mg/dL).  Liver Function Tests: Recent Labs  Lab 01/26/21 0506  AST 10*  ALT <5  ALKPHOS 103  BILITOT 0.4  PROT 6.2*  ALBUMIN 1.5*     CBG: No results for input(s): GLUCAP in the last 168 hours.   Recent Results (from the past 240 hour(s))  Urine Culture     Status: Abnormal   Collection Time: 01/19/21 12:15 AM   Specimen: In/Out Cath Urine  Result Value Ref Range Status   Specimen Description IN/OUT CATH URINE  Final   Special Requests   Final    NONE Performed at [facility], [address], [place], [place] [address]    Culture (A)  Final    50,000 COLONIES/mL METHICILLIN RESISTANT STAPHYLOCOCCUS AUREUS   Report Status 01/21/2021 FINAL  Final   Organism ID, Bacteria METHICILLIN RESISTANT STAPHYLOCOCCUS AUREUS (A)  Final      Susceptibility   Methicillin resistant staphylococcus aureus - MIC*    CIPROFLOXACIN >=8 RESISTANT Resistant     GENTAMICIN <=0.5 SENSITIVE Sensitive     NITROFURANTOIN <=16 SENSITIVE Sensitive     OXACILLIN >=4 RESISTANT Resistant     TETRACYCLINE >=16 RESISTANT Resistant     VANCOMYCIN <=0.5 SENSITIVE Sensitive     TRIMETH/SULFA 160 RESISTANT Resistant     CLINDAMYCIN <=0.25 SENSITIVE Sensitive     RIFAMPIN <=0.5 SENSITIVE Sensitive     Inducible Clindamycin NEGATIVE Sensitive     * 50,000 COLONIES/mL METHICILLIN RESISTANT STAPHYLOCOCCUS AUREUS  Resp Panel by RT-PCR (Flu A&B, Covid) Nasopharyngeal Swab     Status: None   Collection Time: 01/19/21 12:21 AM   Specimen: Nasopharyngeal Swab; Nasopharyngeal(NP) swabs in vial transport medium  Result Value Ref Range Status   SARS Coronavirus 2 by RT PCR NEGATIVE NEGATIVE Final    Comment: (NOTE) SARS-CoV-2 target nucleic acids are NOT DETECTED.  The SARS-CoV-2 RNA is generally detectable in upper respiratory specimens during the  acute phase of infection. The lowest concentration of SARS-CoV-2 viral copies this assay can detect is 138 copies/mL. A negative result does not preclude SARS-Cov-2 infection and should not be used as the sole basis for treatment or other patient management decisions. A negative result may occur with  improper specimen collection/handling, submission of specimen other than nasopharyngeal swab, presence of viral mutation(s) within the areas targeted by this assay, and inadequate number of viral copies(<138 copies/mL). A negative result must be combined with clinical observations, patient history, and epidemiological information. The expected result is Negative.  Fact Sheet for Patients:  EntrepreneurPulse.com.au  Fact Sheet for Healthcare Providers:  IncredibleEmployment.be  This test is no t yet approved or cleared by the [place] FDA and  has been authorized for detection and/or diagnosis of SARS-CoV-2 by FDA under an Emergency Use Authorization (EUA). This EUA will remain  in effect (meaning this test can be used)  for the duration of the COVID-19 declaration under Section 564(b)(1) of the Act, 21 U.S.C.section 360bbb-3(b)(1), unless the authorization is terminated  or revoked sooner.       Influenza A by PCR NEGATIVE NEGATIVE Final   Influenza B by PCR NEGATIVE NEGATIVE Final    Comment: (NOTE) The Xpert Xpress SARS-CoV-2/FLU/RSV plus assay is intended as an aid in the diagnosis of influenza from Nasopharyngeal swab specimens and should not be used as a sole basis for treatment. Nasal washings and aspirates are unacceptable for Xpert Xpress SARS-CoV-2/FLU/RSV testing.  Fact Sheet for Patients: EntrepreneurPulse.com.au  Fact Sheet for Healthcare Providers: IncredibleEmployment.be  This test is not yet approved or cleared by the [place] FDA and has been authorized for detection and/or  diagnosis of SARS-CoV-2 by FDA under an Emergency Use Authorization (EUA). This EUA will remain in effect (meaning this test can be used) for the duration of the COVID-19 declaration under Section 564(b)(1) of the Act, 21 U.S.C. section 360bbb-3(b)(1), unless the authorization is terminated or revoked.  Performed at [facility], [place] [address] [place], [place] [address]   Blood Culture (routine x 2)     Status: Abnormal   Collection Time: 01/19/21 12:30 AM   Specimen: BLOOD RIGHT HAND  Result Value Ref Range Status   Specimen Description BLOOD RIGHT HAND  Final   Special Requests   Final    BOTTLES DRAWN AEROBIC AND ANAEROBIC Blood Culture results may not be optimal due to an inadequate volume of blood received in culture bottles   Culture  Setup Time   Final    GRAM POSITIVE COCCI IN CLUSTERS IN BOTH AEROBIC AND ANAEROBIC BOTTLES CRITICAL RESULT CALLED TO, READ BACK BY AND VERIFIED WITH: PHARMD ALEX L [id number] FCP    Culture (A)  Final    STAPHYLOCOCCUS AUREUS SUSCEPTIBILITIES PERFORMED ON PREVIOUS CULTURE WITHIN THE LAST 5 DAYS. Performed at [facility], [place] [address] [place], [place] [address]    Report Status 01/21/2021 FINAL  Final  Blood Culture ID Panel (Reflexed)     Status: Abnormal   Collection Time: 01/19/21 12:30 AM  Result Value Ref Range Status   Enterococcus faecalis NOT DETECTED NOT DETECTED Final   Enterococcus Faecium NOT DETECTED NOT DETECTED Final   Listeria monocytogenes NOT DETECTED NOT DETECTED Final   Staphylococcus species DETECTED (A) NOT DETECTED Final    Comment: CRITICAL RESULT CALLED TO, READ BACK BY AND VERIFIED WITH: PHARMD ALEX L [id number] FCP    Staphylococcus aureus (BCID) DETECTED (A) NOT DETECTED Final    Comment: Methicillin (oxacillin)-resistant Staphylococcus aureus (MRSA). MRSA is predictably resistant to beta-lactam antibiotics (except ceftaroline). Preferred therapy is vancomycin unless clinically contraindicated.  Patient requires contact precautions if  hospitalized. CRITICAL RESULT CALLED TO, READ BACK BY AND VERIFIED WITH: PHARMD ALEX L [id number] FCP    Staphylococcus epidermidis NOT DETECTED NOT DETECTED Final   Staphylococcus lugdunensis NOT DETECTED NOT DETECTED Final   Streptococcus species NOT DETECTED NOT DETECTED Final   Streptococcus agalactiae NOT DETECTED NOT DETECTED Final   Streptococcus pneumoniae NOT DETECTED NOT DETECTED Final   Streptococcus pyogenes NOT DETECTED NOT DETECTED Final   A.calcoaceticus-baumannii NOT DETECTED NOT DETECTED Final   Bacteroides fragilis NOT DETECTED NOT DETECTED Final   Enterobacterales NOT DETECTED NOT DETECTED Final   Enterobacter cloacae complex NOT DETECTED NOT DETECTED Final   Escherichia coli NOT DETECTED NOT DETECTED Final   Klebsiella aerogenes NOT DETECTED NOT DETECTED Final   Klebsiella oxytoca  NOT DETECTED NOT DETECTED Final   Klebsiella pneumoniae NOT DETECTED NOT DETECTED Final   Proteus species NOT DETECTED NOT DETECTED Final   Salmonella species NOT DETECTED NOT DETECTED Final   Serratia marcescens NOT DETECTED NOT DETECTED Final   Haemophilus influenzae NOT DETECTED NOT DETECTED Final   Neisseria meningitidis NOT DETECTED NOT DETECTED Final   Pseudomonas aeruginosa NOT DETECTED NOT DETECTED Final   Stenotrophomonas maltophilia NOT DETECTED NOT DETECTED Final   Candida albicans NOT DETECTED NOT DETECTED Final   Candida auris NOT DETECTED NOT DETECTED Final   Candida glabrata NOT DETECTED NOT DETECTED Final   Candida krusei NOT DETECTED NOT DETECTED Final   Candida parapsilosis NOT DETECTED NOT DETECTED Final   Candida tropicalis NOT DETECTED NOT DETECTED Final   Cryptococcus neoformans/gattii NOT DETECTED NOT DETECTED Final   Meth resistant mecA/C and MREJ DETECTED (A) NOT DETECTED Final    Comment: CRITICAL RESULT CALLED TO, READ BACK BY AND VERIFIED WITH: PHARMD ALEX L [phone] [id number] FCP Performed at [name], [address] [place], [place] [address]   Blood Culture (routine x 2)     Status: Abnormal   Collection Time: 01/19/21 12:34 AM   Specimen: BLOOD LEFT WRIST  Result Value Ref Range Status   Specimen Description BLOOD LEFT WRIST  Final   Special Requests   Final    BOTTLES DRAWN AEROBIC AND ANAEROBIC Blood Culture results may not be optimal due to an inadequate volume of blood received in culture bottles   Culture  Setup Time   Final    GRAM POSITIVE COCCI IN BOTH AEROBIC AND ANAEROBIC BOTTLES Performed at [facility], [place] [address], [place], [place] [address]    Culture METHICILLIN RESISTANT STAPHYLOCOCCUS AUREUS (A)  Final   Report Status 01/21/2021 FINAL  Final   Organism ID, Bacteria METHICILLIN RESISTANT STAPHYLOCOCCUS AUREUS  Final      Susceptibility   Methicillin resistant staphylococcus aureus - MIC*    CIPROFLOXACIN >=8 RESISTANT Resistant     ERYTHROMYCIN >=8 RESISTANT Resistant     GENTAMICIN <=0.5 SENSITIVE Sensitive     OXACILLIN >=4 RESISTANT Resistant     TETRACYCLINE >=16 RESISTANT Resistant     VANCOMYCIN 1 SENSITIVE Sensitive     TRIMETH/SULFA >=320 RESISTANT Resistant     CLINDAMYCIN <=0.25 SENSITIVE Sensitive     RIFAMPIN <=0.5 SENSITIVE Sensitive     Inducible Clindamycin NEGATIVE Sensitive     * METHICILLIN RESISTANT STAPHYLOCOCCUS AUREUS  Wet prep, genital     Status: Abnormal   Collection Time: 01/19/21  5:20 AM   Specimen: PATH Cytology Cervicovaginal Ancillary Only  Result Value Ref Range Status   Yeast Wet Prep HPF POC NONE SEEN NONE SEEN Final   Trich, Wet Prep NONE SEEN NONE SEEN Final   Clue Cells Wet Prep HPF POC PRESENT (A) NONE SEEN Final   WBC, Wet Prep HPF POC MANY (A) NONE SEEN Final   Sperm NONE SEEN  Final    Comment: Performed at [facility], [place] [address], [place], [place] [address]  Culture, blood (Routine X 2) w Reflex to ID Panel     Status: Abnormal   Collection Time: 01/23/21  1:00 PM   Specimen: BLOOD RIGHT HAND  Result  Value Ref Range Status   Specimen Description BLOOD RIGHT HAND  Final   Special Requests IN PEDIATRIC BOTTLE Blood Culture adequate volume  Final   Culture  Setup Time   Final  GRAM POSITIVE COCCI AEROBIC BOTTLE ONLY CRITICAL VALUE NOTED.  VALUE IS CONSISTENT WITH PREVIOUSLY REPORTED AND CALLED VALUE.    Culture (A)  Final    STAPHYLOCOCCUS AUREUS SUSCEPTIBILITIES PERFORMED ON PREVIOUS CULTURE WITHIN THE LAST 5 DAYS. Performed at [facility], [place] [address], [place], [place] [address]    Report Status 01/25/2021 FINAL  Final  Culture, blood (Routine X 2) w Reflex to ID Panel     Status: Abnormal (Preliminary result)   Collection Time: 01/24/21  6:54 AM   Specimen: BLOOD  Result Value Ref Range Status   Specimen Description BLOOD RIGHT ANTECUBITAL  Final   Special Requests   Final    AEROBIC BOTTLE ONLY Blood Culture results may not be optimal due to an inadequate volume of blood received in culture bottles   Culture  Setup Time   Final    GRAM POSITIVE COCCI AEROBIC BOTTLE ONLY CRITICAL VALUE NOTED.  VALUE IS CONSISTENT WITH PREVIOUSLY REPORTED AND CALLED VALUE.    Culture (A)  Final    STAPHYLOCOCCUS AUREUS Sent to [place] for further susceptibility testing. Performed at [facility], [place] [address] [place], [place] [address]    Report Status PENDING  Incomplete  Culture, blood (routine x 2)     Status: None (Preliminary result)   Collection Time: 01/26/21  5:06 AM   Specimen: BLOOD RIGHT HAND  Result Value Ref Range Status   Specimen Description BLOOD RIGHT HAND  Final   Special Requests AEROBIC BOTTLE ONLY Blood Culture adequate volume  Final   Culture   Final    NO GROWTH 1 DAY Performed at [facility], [place] [address] [place], [place] [address]    Report Status PENDING  Incomplete  Culture, blood (routine x 2)     Status: None (Preliminary result)   Collection Time: 01/26/21  5:06 AM   Specimen: BLOOD LEFT HAND  Result Value Ref Range Status    Specimen Description BLOOD LEFT HAND  Final   Special Requests AEROBIC BOTTLE ONLY Blood Culture adequate volume  Final   Culture   Final    NO GROWTH 1 DAY Performed at [facility], [place] [address], [place], [place] [address]    Report Status PENDING  Incomplete  Aerobic/Anaerobic Culture w Gram Stain (surgical/deep wound)     Status: None (Preliminary result)   Collection Time: 01/26/21 10:58 AM   Specimen: Abscess  Result Value Ref Range Status   Specimen Description ABSCESS ASPIRATE  Final   Special Requests SYMPHYSIS PUBIS  Final   Gram Stain   Final    ABUNDANT WBC PRESENT, PREDOMINANTLY PMN FEW GRAM POSITIVE COCCI IN CLUSTERS    Culture   Final    FEW STAPHYLOCOCCUS AUREUS SUSCEPTIBILITIES TO FOLLOW Performed at [facility], [place] [address] [place], [place] [address]    Report Status PENDING  Incomplete          Radiology Studies: CT ASPIRATION  Result Date: 01/26/2021 CLINICAL DATA:  septic arthritis at the pubic symphysis with osteomyelitis involving the pubic bones and abscesses surrounding the pubic bones. EXAM: CT GUIDED ASPIRATION BIOPSY OF PUBIC SYMPHYSIS ANESTHESIA/SEDATION: Intravenous Fentanyl 121mg and Versed 250mwere administered as conscious sedation during continuous monitoring of the patient's level of consciousness and physiological / cardiorespiratory status by the radiology RN, with a total moderate sedation time of 12 minutes. PROCEDURE: The procedure risks, benefits, and alternatives were explained to the patient. Questions regarding the procedure  were encouraged and answered. The patient understands and consents to the procedure. Select axial scans through the pelvis obtained. Pubic symphysis localized and an appropriate skin entry site determined and marked. The operative field was prepped with chlorhexidinein a sterile fashion, and a sterile drape was applied covering the operative field. A sterile gown and sterile gloves were used for  the procedure. Local anesthesia was provided with 1% Lidocaine. Under CT fluoroscopic guidance, 17 gauge percutaneous entry needle advanced to the pubic symphysis. Aspiration returned less than 2 mL bloody purulent material, sent for the requested laboratory studies. The patient tolerated the procedure well. COMPLICATIONS: None immediate FINDINGS: Pubic symphysis and surrounding soft tissue changes localized. Needle aspiration sample obtained without complication. IMPRESSION: 1. Technically successful CT-guided aspiration, pubic symphysis. Electronically Signed   By: Lucrezia Europe M.D.   On: 01/26/2021 11:02        Scheduled Meds:  Chlorhexidine Gluconate Cloth  6 each Topical Q0600   enoxaparin (LOVENOX) injection  40 mg Subcutaneous Q24H   methadone  60 mg Oral Daily   polyethylene glycol  17 g Oral Daily   senna-docusate  1 tablet Oral BID   Continuous Infusions:  sodium chloride 10 mL/hr at 01/27/21 0400   ceFTAROline (TEFLARO) IV 600 mg (01/27/21 0537)   DAPTOmycin (CUBICIN)  IV Stopped (01/26/21 2002)     LOS: 8 days        Oren Binet, MD [facility]   To contact the attending provider between 7A-7P or the covering provider during after hours 7P-7A, please log into the web site [email or web address] and access using universal [place] password for that web site. If you do not have the password, please call the hospital operator.  01/27/2021, 1:56 PM

## 2021-01-28 DIAGNOSIS — B9562 Methicillin resistant Staphylococcus aureus infection as the cause of diseases classified elsewhere: Secondary | ICD-10-CM | POA: Diagnosis not present

## 2021-01-28 DIAGNOSIS — R7881 Bacteremia: Secondary | ICD-10-CM | POA: Diagnosis not present

## 2021-01-28 MED ORDER — TAMSULOSIN HCL 0.4 MG PO CAPS
0.4000 mg | ORAL_CAPSULE | Freq: Every day | ORAL | Status: DC
  Administered 2021-01-28 – 2021-02-22 (×25): 0.4 mg via ORAL
  Filled 2021-01-28 (×26): qty 1

## 2021-01-28 MED ORDER — METHADONE HCL 5 MG PO TABS
75.0000 mg | ORAL_TABLET | Freq: Every day | ORAL | Status: DC
  Administered 2021-01-29 – 2021-01-30 (×2): 75 mg via ORAL
  Filled 2021-01-28 (×2): qty 7

## 2021-01-28 NOTE — Progress Notes (Signed)
PROGRESS NOTE    Kelly [name]  DVV:[id number] DOB: 31-Jan-1986 DOA: 01/19/2021 PCP: Pcp, No   Chief Complaint  Patient presents with   Abdominal Pain    Brief Narrative:  Kelly [name] is a 35 y.o. 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  Subjective: No major issues overnight-still with pelvic pain-but otherwise appears stable.  Objective: Vitals: Blood pressure 110/76, pulse 79, temperature 97.7 F (36.5 C), temperature source Oral, resp. rate 15, height 5' 7"  (1.702 m), weight 82 kg, SpO2 91 %, unknown if currently breastfeeding.   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  RVSP 68.6 mmHg.  Severe TR.  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  Per ID-no benefit to repeating a echo/TEE-as it would not change management (patient not a candidate for surgery).  Continue empiric IV antibiotics per infectious disease.     Bacterial  vaginosis: Completed a course of oral Flagyl  Hyponatremia: Euvolemic-improving-with fluid restriction.  Follow periodically.  Acute urinary retention: Failed voiding trial of few days.Will need a repeat voiding trial in the next 2 days.  Start Flomax.  Prolonged QTC: Resolved on EKG on 9/9.  Monitor in telemetry.  Monitor periodically as patient on methadone.  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  Increase methadone to 75 mg-continue as needed Dilaudid/Toradol.  Repeat twelve-lead EKG in the morning.    Microcytic anemia: No evidence of overt blood loss-hemoglobin stable compared to prior values.  DVT prophylaxis: Lovenox Code Status: Full Family Communication: None at bedside Disposition:   Status is: Inpatient  Remains inpatient appropriate because:IV treatments appropriate due to intensity of illness or inability to take PO  Dispo: The patient is from: Home              Anticipated d/c is to: Home              Patient currently is not medically stable to d/c.   Difficult to place patient Yes     Consultants:  ID Ortho IR   Data Reviewed: I have personally reviewed following labs and imaging studies  CBC: Recent Labs  Lab 01/23/21 0034 01/24/21 0653 01/25/21 0414 01/26/21 0506 01/27/21 0521  WBC 26.1* 22.9* 21.2* 16.6* 14.3*  HGB 10.4* 9.8* 10.1* 8.6* 9.0*  HCT 34.0* 32.3* 32.2* 28.9* 29.8*  MCV 78.9* 79.0* 79.5* 81.0 81.9  PLT 189 213 380 426* 480*     Basic  Metabolic Panel: Recent Labs  Lab 01/22/21 0602 01/23/21 0034 01/24/21 0327 01/24/21 0653 01/25/21 0414 01/26/21 0506  NA 124* 125*  --  125* 130* 131*  K 4.1 4.7  --  4.2 4.6 4.6  CL 93* 94*  --  93* 95* 101  CO2 22 20*  --  23 21*  23  GLUCOSE 108* 109*  --  91 105* 96  BUN 10 14  --  12 14 15   CREATININE 0.52 0.57  --  0.52 0.57 0.52  CALCIUM 7.8* 7.6*  --  8.0* 8.3* 8.2*  MG 1.7 1.8 1.7 1.7  --   --      GFR: Estimated Creatinine Clearance: 107.1 mL/min (by C-G formula based on SCr of 0.52 mg/dL).  Liver Function Tests: Recent Labs  Lab 01/26/21 0506  AST 10*  ALT <5  ALKPHOS 103  BILITOT 0.4  PROT 6.2*  ALBUMIN 1.5*     CBG: No results for input(s): GLUCAP in the last 168 hours.   Recent Results (from the past 240 hour(s))  Urine Culture     Status: Abnormal   Collection Time: 01/19/21 12:15 AM   Specimen: In/Out Cath Urine  Result Value Ref Range Status   Specimen Description IN/OUT CATH URINE  Final   Special Requests   Final    NONE Performed at [facility], [address], [place], [place] [address]    Culture (A)  Final    50,000 COLONIES/mL METHICILLIN RESISTANT STAPHYLOCOCCUS AUREUS   Report Status 01/21/2021 FINAL  Final   Organism ID, Bacteria METHICILLIN RESISTANT STAPHYLOCOCCUS AUREUS (A)  Final      Susceptibility   Methicillin resistant staphylococcus aureus - MIC*    CIPROFLOXACIN >=8 RESISTANT Resistant     GENTAMICIN <=0.5 SENSITIVE Sensitive     NITROFURANTOIN <=16 SENSITIVE Sensitive     OXACILLIN >=4 RESISTANT Resistant     TETRACYCLINE >=16 RESISTANT Resistant     VANCOMYCIN <=0.5 SENSITIVE Sensitive     TRIMETH/SULFA 160 RESISTANT Resistant     CLINDAMYCIN <=0.25 SENSITIVE Sensitive     RIFAMPIN <=0.5 SENSITIVE Sensitive     Inducible Clindamycin NEGATIVE Sensitive     * 50,000 COLONIES/mL METHICILLIN RESISTANT STAPHYLOCOCCUS AUREUS  Resp Panel by RT-PCR (Flu A&B, Covid) Nasopharyngeal Swab     Status: None   Collection Time: 01/19/21 12:21 AM   Specimen: Nasopharyngeal Swab; Nasopharyngeal(NP) swabs in vial transport medium  Result Value Ref Range Status   SARS Coronavirus 2 by RT PCR NEGATIVE NEGATIVE Final    Comment: (NOTE) SARS-CoV-2 target  nucleic acids are NOT DETECTED.  The SARS-CoV-2 RNA is generally detectable in upper respiratory specimens during the acute phase of infection. The lowest concentration of SARS-CoV-2 viral copies this assay can detect is 138 copies/mL. A negative result does not preclude SARS-Cov-2 infection and should not be used as the sole basis for treatment or other patient management decisions. A negative result may occur with  improper specimen collection/handling, submission of specimen other than nasopharyngeal swab, presence of viral mutation(s) within the areas targeted by this assay, and inadequate number of viral copies(<138 copies/mL). A negative result must be combined with clinical observations, patient history, and epidemiological information. The expected result is Negative.  Fact Sheet for Patients:  EntrepreneurPulse.com.au  Fact Sheet for Healthcare Providers:  IncredibleEmployment.be  This test is no t yet approved or cleared by the [place] FDA and  has been authorized for detection and/or diagnosis of SARS-CoV-2 by FDA under  an Emergency Use Authorization (EUA). This EUA will remain  in effect (meaning this test can be used) for the duration of the COVID-19 declaration under Section 564(b)(1) of the Act, 21 U.S.C.section 360bbb-3(b)(1), unless the authorization is terminated  or revoked sooner.       Influenza A by PCR NEGATIVE NEGATIVE Final   Influenza B by PCR NEGATIVE NEGATIVE Final    Comment: (NOTE) The Xpert Xpress SARS-CoV-2/FLU/RSV plus assay is intended as an aid in the diagnosis of influenza from Nasopharyngeal swab specimens and should not be used as a sole basis for treatment. Nasal washings and aspirates are unacceptable for Xpert Xpress SARS-CoV-2/FLU/RSV testing.  Fact Sheet for Patients: EntrepreneurPulse.com.au  Fact Sheet for Healthcare  Providers: IncredibleEmployment.be  This test is not yet approved or cleared by the [place] FDA and has been authorized for detection and/or diagnosis of SARS-CoV-2 by FDA under an Emergency Use Authorization (EUA). This EUA will remain in effect (meaning this test can be used) for the duration of the COVID-19 declaration under Section 564(b)(1) of the Act, 21 U.S.C. section 360bbb-3(b)(1), unless the authorization is terminated or revoked.  Performed at [facility], [place] [address], [place], [place] [address]   Blood Culture (routine x 2)     Status: Abnormal   Collection Time: 01/19/21 12:30 AM   Specimen: BLOOD RIGHT HAND  Result Value Ref Range Status   Specimen Description BLOOD RIGHT HAND  Final   Special Requests   Final    BOTTLES DRAWN AEROBIC AND ANAEROBIC Blood Culture results may not be optimal due to an inadequate volume of blood received in culture bottles   Culture  Setup Time   Final    GRAM POSITIVE COCCI IN CLUSTERS IN BOTH AEROBIC AND ANAEROBIC BOTTLES CRITICAL RESULT CALLED TO, READ BACK BY AND VERIFIED WITH: PHARMD ALEX L [id number] FCP    Culture (A)  Final    STAPHYLOCOCCUS AUREUS SUSCEPTIBILITIES PERFORMED ON PREVIOUS CULTURE WITHIN THE LAST 5 DAYS. Performed at [facility], [place] [address] [place], [place] [address]    Report Status 01/21/2021 FINAL  Final  Blood Culture ID Panel (Reflexed)     Status: Abnormal   Collection Time: 01/19/21 12:30 AM  Result Value Ref Range Status   Enterococcus faecalis NOT DETECTED NOT DETECTED Final   Enterococcus Faecium NOT DETECTED NOT DETECTED Final   Listeria monocytogenes NOT DETECTED NOT DETECTED Final   Staphylococcus species DETECTED (A) NOT DETECTED Final    Comment: CRITICAL RESULT CALLED TO, READ BACK BY AND VERIFIED WITH: PHARMD ALEX L [id number] FCP    Staphylococcus aureus (BCID) DETECTED (A) NOT DETECTED Final    Comment: Methicillin (oxacillin)-resistant  Staphylococcus aureus (MRSA). MRSA is predictably resistant to beta-lactam antibiotics (except ceftaroline). Preferred therapy is vancomycin unless clinically contraindicated. Patient requires contact precautions if  hospitalized. CRITICAL RESULT CALLED TO, READ BACK BY AND VERIFIED WITH: PHARMD ALEX L [id number] FCP    Staphylococcus epidermidis NOT DETECTED NOT DETECTED Final   Staphylococcus lugdunensis NOT DETECTED NOT DETECTED Final   Streptococcus species NOT DETECTED NOT DETECTED Final   Streptococcus agalactiae NOT DETECTED NOT DETECTED Final   Streptococcus pneumoniae NOT DETECTED NOT DETECTED Final   Streptococcus pyogenes NOT DETECTED NOT DETECTED Final   A.calcoaceticus-baumannii NOT DETECTED NOT DETECTED Final   Bacteroides fragilis NOT DETECTED NOT DETECTED Final   Enterobacterales NOT DETECTED NOT DETECTED Final   Enterobacter cloacae complex NOT DETECTED NOT DETECTED Final   Escherichia coli  NOT DETECTED NOT DETECTED Final   Klebsiella aerogenes NOT DETECTED NOT DETECTED Final   Klebsiella oxytoca NOT DETECTED NOT DETECTED Final   Klebsiella pneumoniae NOT DETECTED NOT DETECTED Final   Proteus species NOT DETECTED NOT DETECTED Final   Salmonella species NOT DETECTED NOT DETECTED Final   Serratia marcescens NOT DETECTED NOT DETECTED Final   Haemophilus influenzae NOT DETECTED NOT DETECTED Final   Neisseria meningitidis NOT DETECTED NOT DETECTED Final   Pseudomonas aeruginosa NOT DETECTED NOT DETECTED Final   Stenotrophomonas maltophilia NOT DETECTED NOT DETECTED Final   Candida albicans NOT DETECTED NOT DETECTED Final   Candida auris NOT DETECTED NOT DETECTED Final   Candida glabrata NOT DETECTED NOT DETECTED Final   Candida krusei NOT DETECTED NOT DETECTED Final   Candida parapsilosis NOT DETECTED NOT DETECTED Final   Candida tropicalis NOT DETECTED NOT DETECTED Final   Cryptococcus neoformans/gattii NOT DETECTED NOT DETECTED Final   Meth resistant mecA/C and MREJ  DETECTED (A) NOT DETECTED Final    Comment: CRITICAL RESULT CALLED TO, READ BACK BY AND VERIFIED WITH: PHARMD ALEX L [phone] [id number] FCP Performed at [facility], [address], [place], [place] [address]   Blood Culture (routine x 2)     Status: Abnormal   Collection Time: 01/19/21 12:34 AM   Specimen: BLOOD LEFT WRIST  Result Value Ref Range Status   Specimen Description BLOOD LEFT WRIST  Final   Special Requests   Final    BOTTLES DRAWN AEROBIC AND ANAEROBIC Blood Culture results may not be optimal due to an inadequate volume of blood received in culture bottles   Culture  Setup Time   Final    GRAM POSITIVE COCCI IN BOTH AEROBIC AND ANAEROBIC BOTTLES Performed at [facility], [place] [address], [place], [place] [address]    Culture METHICILLIN RESISTANT STAPHYLOCOCCUS AUREUS (A)  Final   Report Status 01/21/2021 FINAL  Final   Organism ID, Bacteria METHICILLIN RESISTANT STAPHYLOCOCCUS AUREUS  Final      Susceptibility   Methicillin resistant staphylococcus aureus - MIC*    CIPROFLOXACIN >=8 RESISTANT Resistant     ERYTHROMYCIN >=8 RESISTANT Resistant     GENTAMICIN <=0.5 SENSITIVE Sensitive     OXACILLIN >=4 RESISTANT Resistant     TETRACYCLINE >=16 RESISTANT Resistant     VANCOMYCIN 1 SENSITIVE Sensitive     TRIMETH/SULFA >=320 RESISTANT Resistant     CLINDAMYCIN <=0.25 SENSITIVE Sensitive     RIFAMPIN <=0.5 SENSITIVE Sensitive     Inducible Clindamycin NEGATIVE Sensitive     * METHICILLIN RESISTANT STAPHYLOCOCCUS AUREUS  Wet prep, genital     Status: Abnormal   Collection Time: 01/19/21  5:20 AM   Specimen: PATH Cytology Cervicovaginal Ancillary Only  Result Value Ref Range Status   Yeast Wet Prep HPF POC NONE SEEN NONE SEEN Final   Trich, Wet Prep NONE SEEN NONE SEEN Final   Clue Cells Wet Prep HPF POC PRESENT (A) NONE SEEN Final   WBC, Wet Prep HPF POC MANY (A) NONE SEEN Final   Sperm NONE SEEN  Final    Comment: Performed at [facility], [place]  [address], [place],  [address]  Culture, blood (Routine X 2) w Reflex to ID Panel     Status: Abnormal   Collection Time: 01/23/21  1:00 PM   Specimen: BLOOD RIGHT HAND  Result Value Ref Range Status   Specimen Description BLOOD RIGHT HAND  Final   Special Requests IN PEDIATRIC  BOTTLE Blood Culture adequate volume  Final   Culture  Setup Time   Final    GRAM POSITIVE COCCI AEROBIC BOTTLE ONLY CRITICAL VALUE NOTED.  VALUE IS CONSISTENT WITH PREVIOUSLY REPORTED AND CALLED VALUE.    Culture (A)  Final    STAPHYLOCOCCUS AUREUS SUSCEPTIBILITIES PERFORMED ON PREVIOUS CULTURE WITHIN THE LAST 5 DAYS. Performed at [facility], [place] [address] [place], [place] [address]    Report Status 01/25/2021 FINAL  Final  Culture, blood (Routine X 2) w Reflex to ID Panel     Status: Abnormal (Preliminary result)   Collection Time: 01/24/21  6:54 AM   Specimen: BLOOD  Result Value Ref Range Status   Specimen Description BLOOD RIGHT ANTECUBITAL  Final   Special Requests   Final    AEROBIC BOTTLE ONLY Blood Culture results may not be optimal due to an inadequate volume of blood received in culture bottles   Culture  Setup Time   Final    GRAM POSITIVE COCCI AEROBIC BOTTLE ONLY CRITICAL VALUE NOTED.  VALUE IS CONSISTENT WITH PREVIOUSLY REPORTED AND CALLED VALUE.    Culture (A)  Final    STAPHYLOCOCCUS AUREUS Sent to [place] for further susceptibility testing. Performed at [facility], [place] [address], [place], [place] [address]    Report Status PENDING  Incomplete  Culture, blood (routine x 2)     Status: None (Preliminary result)   Collection Time: 01/26/21  5:06 AM   Specimen: BLOOD RIGHT HAND  Result Value Ref Range Status   Specimen Description BLOOD RIGHT HAND  Final   Special Requests AEROBIC BOTTLE ONLY Blood Culture adequate volume  Final   Culture   Final    NO GROWTH 2 DAYS Performed at [facility], [place] [address], [place], [place] [address]    Report Status PENDING   Incomplete  Culture, blood (routine x 2)     Status: None (Preliminary result)   Collection Time: 01/26/21  5:06 AM   Specimen: BLOOD LEFT HAND  Result Value Ref Range Status   Specimen Description BLOOD LEFT HAND  Final   Special Requests AEROBIC BOTTLE ONLY Blood Culture adequate volume  Final   Culture   Final    NO GROWTH 2 DAYS Performed at [facility], [place] [address], [place], [place] [address]    Report Status PENDING  Incomplete  Aerobic/Anaerobic Culture w Gram Stain (surgical/deep wound)     Status: None (Preliminary result)   Collection Time: 01/26/21 10:58 AM   Specimen: Abscess  Result Value Ref Range Status   Specimen Description ABSCESS ASPIRATE  Final   Special Requests SYMPHYSIS PUBIS  Final   Gram Stain   Final    ABUNDANT WBC PRESENT, PREDOMINANTLY PMN FEW GRAM POSITIVE COCCI IN CLUSTERS Performed at [facility], [place] [address], [place],  [address]    Culture   Final    FEW METHICILLIN RESISTANT STAPHYLOCOCCUS AUREUS NO ANAEROBES ISOLATED; CULTURE IN PROGRESS FOR 5 DAYS    Report Status PENDING  Incomplete   Organism ID, Bacteria METHICILLIN RESISTANT STAPHYLOCOCCUS AUREUS  Final      Susceptibility   Methicillin resistant staphylococcus aureus - MIC*    CIPROFLOXACIN >=8 RESISTANT Resistant     ERYTHROMYCIN >=8 RESISTANT Resistant     GENTAMICIN <=0.5 SENSITIVE Sensitive     OXACILLIN >=4 RESISTANT Resistant     TETRACYCLINE >=16 RESISTANT Resistant     VANCOMYCIN <=0.5 SENSITIVE Sensitive  TRIMETH/SULFA >=320 RESISTANT Resistant     CLINDAMYCIN <=0.25 SENSITIVE Sensitive     RIFAMPIN <=0.5 SENSITIVE Sensitive     Inducible Clindamycin NEGATIVE Sensitive     * FEW METHICILLIN RESISTANT STAPHYLOCOCCUS AUREUS          Radiology Studies: No results found.      Scheduled Meds:  Chlorhexidine Gluconate Cloth  6 each Topical Q0600   enoxaparin (LOVENOX) injection  40 mg Subcutaneous Q24H   methadone  60 mg Oral Daily    polyethylene glycol  17 g Oral Daily   senna-docusate  1 tablet Oral BID   tamsulosin  0.4 mg Oral Daily   Continuous Infusions:  sodium chloride Stopped (01/28/21 0504)   ceFTAROline (TEFLARO) IV 100 mL/hr at 01/28/21 0600   DAPTOmycin (CUBICIN)  IV Stopped (01/27/21 2053)     LOS: 9 days        Oren Binet, MD [facility]   To contact the attending provider between 7A-7P or the covering provider during after hours 7P-7A, please log into the web site [email or web address] and access using universal [place] password for that web site. If you do not have the password, please call the hospital operator.  01/28/2021, 3:03 PM

## 2021-01-29 DIAGNOSIS — R188 Other ascites: Secondary | ICD-10-CM | POA: Diagnosis not present

## 2021-01-29 DIAGNOSIS — F1910 Other psychoactive substance abuse, uncomplicated: Secondary | ICD-10-CM | POA: Diagnosis not present

## 2021-01-29 DIAGNOSIS — I079 Rheumatic tricuspid valve disease, unspecified: Secondary | ICD-10-CM | POA: Diagnosis not present

## 2021-01-29 DIAGNOSIS — R7881 Bacteremia: Secondary | ICD-10-CM | POA: Diagnosis not present

## 2021-01-29 LAB — CBC
HCT: 27.7 % — ABNORMAL LOW (ref 36.0–46.0)
Hemoglobin: 8.3 g/dL — ABNORMAL LOW (ref 12.0–15.0)
MCH: 24.3 pg — ABNORMAL LOW (ref 26.0–34.0)
MCHC: 30 g/dL (ref 30.0–36.0)
MCV: 81 fL (ref 80.0–100.0)
Platelets: 620 10*3/uL — ABNORMAL HIGH (ref 150–400)
RBC: 3.42 MIL/uL — ABNORMAL LOW (ref 3.87–5.11)
RDW: 19.6 % — ABNORMAL HIGH (ref 11.5–15.5)
WBC: 12.3 10*3/uL — ABNORMAL HIGH (ref 4.0–10.5)
nRBC: 0 % (ref 0.0–0.2)

## 2021-01-29 LAB — BASIC METABOLIC PANEL
Anion gap: 11 (ref 5–15)
BUN: 11 mg/dL (ref 6–20)
CO2: 23 mmol/L (ref 22–32)
Calcium: 8.4 mg/dL — ABNORMAL LOW (ref 8.9–10.3)
Chloride: 95 mmol/L — ABNORMAL LOW (ref 98–111)
Creatinine, Ser: 0.53 mg/dL (ref 0.44–1.00)
GFR, Estimated: >60 mL/min (ref >60)
Glucose, Bld: 96 mg/dL (ref 70–99)
Potassium: 4.8 mmol/L (ref 3.5–5.1)
Sodium: 129 mmol/L — ABNORMAL LOW (ref 135–145)

## 2021-01-29 LAB — MISC LABCORP TEST (SEND OUT)
LabCorp test name: 2
Labcorp test code: 88005

## 2021-01-29 LAB — MAGNESIUM: Magnesium: 1.6 mg/dL — ABNORMAL LOW (ref 1.7–2.4)

## 2021-01-29 MED ORDER — MAGNESIUM SULFATE 4 GM/100ML IV SOLN
4.0000 g | Freq: Once | INTRAVENOUS | Status: AC
  Administered 2021-01-29: 4 g via INTRAVENOUS
  Filled 2021-01-29: qty 100

## 2021-01-29 NOTE — Progress Notes (Signed)
PROGRESS NOTE    Kelly [name]  SJG:[id number] DOB: 1986-04-05 DOA: 01/19/2021 PCP: Pcp, No   Chief Complaint  Patient presents with   Abdominal Pain    Brief Narrative:  Kelly [name] is a 35 y.o. 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  Subjective: Pain stable-relatively well-controlled per patient.  Had BM yesterday.  Objective: Vitals: Blood pressure 127/85, pulse 92, temperature 100.1 F (37.8 C), temperature source Axillary, resp. rate 16, height 5' 7"  (1.702 m), weight 82 kg, SpO2 92 %, unknown if currently breastfeeding.   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  RVSP 68.6 mmHg.  Severe TR.  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  Per ID-no benefit to repeating a echo/TEE-as it would not change management (patient not a candidate for surgery).  Continue empiric IV antibiotics per  infectious disease.     Bacterial vaginosis: Completed a course of oral Flagyl  Hyponatremia: Euvolemic-slight drop in sodium levels-maintain fluid restriction-follow electrolytes.  Acute urinary retention: Failed voiding trial of few days back-on Flomax-she is very hesitant to remove Foley catheter-we will give her a few more days before proceeding with a voiding trial.  Hopefully once pain is adequately controlled will need a repeat voiding trial in the next 2 days-  Prolonged QTC: Resolved on EKG on 9/9.  All monitor in telemetry.  QTC stable on 9/18.    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  Currently on methadone 75 mg-and as needed Toradol/Dilaudid for breakthrough pain.  Twelve-lead EKG with stable QTC.  We will slowly increase methadone over the next few days.  On bowel regimen with MiraLAX/senna.  Microcytic anemia: No evidence of overt blood loss-hemoglobin stable compared to prior values.  DVT prophylaxis: Lovenox Code Status: Full Family Communication: None at bedside Disposition:   Status is: Inpatient  Remains inpatient appropriate because:IV treatments appropriate due to intensity of illness or inability to take PO  Dispo: The patient is from: Home              Anticipated d/c is to: Home              Patient currently is not medically stable to d/c.   Difficult to place patient Yes     Consultants:  ID Ortho IR   Data Reviewed: I have personally reviewed following labs and imaging studies  CBC: Recent Labs  Lab 01/24/21 0653 01/25/21 0414 01/26/21 0506 01/27/21 0521 01/29/21 0101  WBC 22.9* 21.2* 16.6* 14.3* 12.3*  HGB 9.8* 10.1* 8.6* 9.0* 8.3*  HCT 32.3* 32.2* 28.9* 29.8* 27.7*  MCV 79.0*  79.5* 81.0 81.9 81.0  PLT 213 380 426* 480* 620*     Basic Metabolic Panel: Recent Labs  Lab 01/23/21 0034 01/24/21 0327 01/24/21 0653 01/25/21 0414 01/26/21 0506 01/29/21 0101  NA 125*  --  125* 130* 131* 129*  K 4.7  --  4.2 4.6 4.6 4.8  CL 94*  --  93* 95* 101 95*  CO2 20*  --  23 21* 23 23  GLUCOSE 109*  --  91 105* 96 96  BUN 14  --  12 14 15 11   CREATININE 0.57  --  0.52 0.57 0.52 0.53  CALCIUM 7.6*  --  8.0* 8.3* 8.2* 8.4*  MG 1.8 1.7 1.7  --   --  1.6*     GFR: Estimated Creatinine Clearance: 107.1 mL/min (by C-G formula based on SCr of 0.53 mg/dL).  Liver Function Tests: Recent Labs  Lab 01/26/21 0506  AST 10*  ALT <5  ALKPHOS 103  BILITOT 0.4  PROT 6.2*  ALBUMIN 1.5*     CBG: No results for input(s): GLUCAP in the last 168 hours.   Recent Results (from the past 240 hour(s))  Culture, blood (Routine X 2) w Reflex to ID Panel     Status: Abnormal   Collection Time: 01/23/21  1:00 PM   Specimen: BLOOD RIGHT HAND  Result Value Ref Range Status   Specimen Description BLOOD RIGHT HAND  Final   Special Requests IN PEDIATRIC BOTTLE Blood Culture adequate volume  Final   Culture  Setup Time   Final    GRAM POSITIVE COCCI AEROBIC BOTTLE ONLY CRITICAL VALUE NOTED.  VALUE IS CONSISTENT WITH PREVIOUSLY REPORTED AND CALLED VALUE.    Culture (A)  Final    STAPHYLOCOCCUS AUREUS SUSCEPTIBILITIES PERFORMED ON PREVIOUS CULTURE WITHIN THE LAST 5 DAYS. Performed at [facility], [place] [address] [place], [place] [address]    Report Status 01/25/2021 FINAL  Final  Culture, blood (Routine X 2) w Reflex to ID Panel     Status: Abnormal (Preliminary result)   Collection Time: 01/24/21  6:54 AM   Specimen: BLOOD  Result Value Ref Range Status   Specimen Description BLOOD RIGHT ANTECUBITAL  Final   Special Requests   Final    AEROBIC BOTTLE ONLY Blood Culture results may not be optimal due to an inadequate volume of blood received in culture bottles    Culture  Setup Time   Final    GRAM POSITIVE COCCI AEROBIC BOTTLE ONLY CRITICAL VALUE NOTED.  VALUE IS CONSISTENT WITH PREVIOUSLY REPORTED AND CALLED VALUE.    Culture (A)  Final    STAPHYLOCOCCUS AUREUS Sent to [facility] for further susceptibility testing. Performed at [facility], [place] [address], [place], [place] [address]    Report Status PENDING  Incomplete  Culture, blood (routine x 2)     Status: None (Preliminary result)   Collection Time: 01/26/21  5:06 AM   Specimen: BLOOD RIGHT HAND  Result Value Ref Range Status   Specimen Description BLOOD RIGHT HAND  Final   Special Requests AEROBIC BOTTLE ONLY Blood Culture adequate volume  Final   Culture   Final    NO GROWTH 3 DAYS Performed at [facility], [place] [address], [place], [place] [address]  Report Status PENDING  Incomplete  Culture, blood (routine x 2)     Status: None (Preliminary result)   Collection Time: 01/26/21  5:06 AM   Specimen: BLOOD LEFT HAND  Result Value Ref Range Status   Specimen Description BLOOD LEFT HAND  Final   Special Requests AEROBIC BOTTLE ONLY Blood Culture adequate volume  Final   Culture   Final    NO GROWTH 3 DAYS Performed at [facility], [address] [place], [place] [address]    Report Status PENDING  Incomplete  Aerobic/Anaerobic Culture w Gram Stain (surgical/deep wound)     Status: None (Preliminary result)   Collection Time: 01/26/21 10:58 AM   Specimen: Abscess  Result Value Ref Range Status   Specimen Description ABSCESS ASPIRATE  Final   Special Requests SYMPHYSIS PUBIS  Final   Gram Stain   Final    ABUNDANT WBC PRESENT, PREDOMINANTLY PMN FEW GRAM POSITIVE COCCI IN CLUSTERS Performed at [facility], [place] [address], [place], [place] [address]    Culture   Final    FEW METHICILLIN RESISTANT STAPHYLOCOCCUS AUREUS NO ANAEROBES ISOLATED; CULTURE IN PROGRESS FOR 5 DAYS    Report Status PENDING  Incomplete   Organism ID, Bacteria METHICILLIN  RESISTANT STAPHYLOCOCCUS AUREUS  Final      Susceptibility   Methicillin resistant staphylococcus aureus - MIC*    CIPROFLOXACIN >=8 RESISTANT Resistant     ERYTHROMYCIN >=8 RESISTANT Resistant     GENTAMICIN <=0.5 SENSITIVE Sensitive     OXACILLIN >=4 RESISTANT Resistant     TETRACYCLINE >=16 RESISTANT Resistant     VANCOMYCIN <=0.5 SENSITIVE Sensitive     TRIMETH/SULFA >=320 RESISTANT Resistant     CLINDAMYCIN <=0.25 SENSITIVE Sensitive     RIFAMPIN <=0.5 SENSITIVE Sensitive     Inducible Clindamycin NEGATIVE Sensitive     * FEW METHICILLIN RESISTANT STAPHYLOCOCCUS AUREUS          Radiology Studies: No results found.      Scheduled Meds:  Chlorhexidine Gluconate Cloth  6 each Topical Q0600   enoxaparin (LOVENOX) injection  40 mg Subcutaneous Q24H   methadone  75 mg Oral Daily   polyethylene glycol  17 g Oral Daily   senna-docusate  1 tablet Oral BID   tamsulosin  0.4 mg Oral Daily   Continuous Infusions:  sodium chloride Stopped (01/28/21 0504)   ceFTAROline (TEFLARO) IV 600 mg (01/29/21 0532)   DAPTOmycin (CUBICIN)  IV 650 mg (01/28/21 2328)     LOS: 10 days        Oren Binet, MD [facility]   To contact the attending provider between 7A-7P or the covering provider during after hours 7P-7A, please log into the web site [email or web address] and access using universal [place] password for that web site. If you do not have the password, please call the hospital operator.  01/29/2021, 10:46 AM

## 2021-01-30 ENCOUNTER — Inpatient Hospital Stay: Payer: Self-pay

## 2021-01-30 DIAGNOSIS — R7881 Bacteremia: Secondary | ICD-10-CM | POA: Diagnosis not present

## 2021-01-30 DIAGNOSIS — M86159 Other acute osteomyelitis, unspecified femur: Secondary | ICD-10-CM

## 2021-01-30 DIAGNOSIS — I079 Rheumatic tricuspid valve disease, unspecified: Secondary | ICD-10-CM | POA: Diagnosis not present

## 2021-01-30 DIAGNOSIS — F1910 Other psychoactive substance abuse, uncomplicated: Secondary | ICD-10-CM | POA: Diagnosis not present

## 2021-01-30 DIAGNOSIS — R188 Other ascites: Secondary | ICD-10-CM | POA: Diagnosis not present

## 2021-01-30 LAB — BASIC METABOLIC PANEL
Anion gap: 11 (ref 5–15)
Anion gap: 11 (ref 5–15)
BUN: 16 mg/dL (ref 6–20)
BUN: 15 mg/dL (ref 6–20)
CO2: 22 mmol/L (ref 22–32)
CO2: 25 mmol/L (ref 22–32)
Calcium: 8.3 mg/dL — ABNORMAL LOW (ref 8.9–10.3)
Calcium: 8.4 mg/dL — ABNORMAL LOW (ref 8.9–10.3)
Chloride: 98 mmol/L (ref 98–111)
Chloride: 97 mmol/L — ABNORMAL LOW (ref 98–111)
Creatinine, Ser: 0.49 mg/dL (ref 0.44–1.00)
Creatinine, Ser: 0.55 mg/dL (ref 0.44–1.00)
GFR, Estimated: >60 mL/min (ref >60)
GFR, Estimated: >60 mL/min (ref >60)
Glucose, Bld: 76 mg/dL (ref 70–99)
Glucose, Bld: 101 mg/dL — ABNORMAL HIGH (ref 70–99)
Potassium: 6.2 mmol/L — ABNORMAL HIGH (ref 3.5–5.1)
Potassium: 4.9 mmol/L (ref 3.5–5.1)
Sodium: 131 mmol/L — ABNORMAL LOW (ref 135–145)
Sodium: 133 mmol/L — ABNORMAL LOW (ref 135–145)

## 2021-01-30 LAB — MAGNESIUM: Magnesium: 2 mg/dL (ref 1.7–2.4)

## 2021-01-30 MED ORDER — INSULIN ASPART 100 UNIT/ML IV SOLN
6.0000 [IU] | Freq: Once | INTRAVENOUS | Status: AC
  Administered 2021-01-30: 6 [IU] via INTRAVENOUS

## 2021-01-30 MED ORDER — KETOROLAC TROMETHAMINE 15 MG/ML IJ SOLN
15.0000 mg | Freq: Once | INTRAMUSCULAR | Status: AC
  Administered 2021-01-30: 15 mg via INTRAVENOUS
  Filled 2021-01-30: qty 1

## 2021-01-30 MED ORDER — DEXTROSE 50 % IV SOLN
25.0000 mL | Freq: Once | INTRAVENOUS | Status: AC
  Administered 2021-01-30: 25 mL via INTRAVENOUS
  Filled 2021-01-30: qty 50

## 2021-01-30 MED ORDER — SODIUM ZIRCONIUM CYCLOSILICATE 5 G PO PACK
5.0000 g | PACK | Freq: Two times a day (BID) | ORAL | Status: AC
  Administered 2021-01-30: 5 g via ORAL
  Filled 2021-01-30 (×2): qty 1

## 2021-01-30 NOTE — Progress Notes (Addendum)
[facility]  Date of Admission:  01/19/2021     Total days of antibiotics 11         ASSESSMENT:  Ms. Nydam blood cultures from 01/26/21 have remained without growth to date and abscess aspiration cultures with multidrug resistance MRSA. Daptomycin and ceftaroline are both sensitive. Overall clinical picture has improved. Continue to hold on central line/PICC placement until blood cultures are finalized clear given persistent bacteremia. Pain management/opioid use disorder treatment appears to be progressing with continued management per primary team. Narrow antibiotics down to daptomycin. Discussed importance of continued treatment with IV therapy. Will likely need to remain hospitalized for duration of treatment and she is understanding of this. Plan for PICC line tomorrow if cultures remain negative.   PLAN:  Continue daptomycin. Discontinue ceftaroline.  Hold PICC placement until cultures are finalized clear -- cleared as of 9/15 Pain management/opioid use disorder per primary team Continue to mobilize.   --------- I have seen the patient and reviewed and agreed with the history/physical exam finding/labs with our APP. I agreed with the assessment/plan unless otherwise noted  Being treated for recurrent mrsa endocarditis as well as disseminated mrsa process with pelvis OM/abscess Bcx sterilized on combination therapy Overall appear to be improving  -agree with management plan above -difficult iv stick/access, reasonable to place picc now -not opat candidate -need 6 weeks iv abx from 9/15   I spent more than 35 minute reviewing data/chart, and coordinating care and >50% direct face to face time providing counseling/discussing diagnostics/treatment plan with patient  Principal Problem:   MRSA bacteremia Active Problems:   IVDU (intravenous drug user)   Back pain   Polysubstance abuse (HCC)   Hyponatremia   Endocarditis of tricuspid valve    Severe tricuspid regurgitation   Prolonged QT interval   Sepsis (HCC)   Constipation   Pelvic fluid collection    Chlorhexidine Gluconate Cloth  6 each Topical Q0600   enoxaparin (LOVENOX) injection  40 mg Subcutaneous Q24H   methadone  75 mg Oral Daily   polyethylene glycol  17 g Oral Daily   senna-docusate  1 tablet Oral BID   sodium zirconium cyclosilicate  5 g Oral BID   tamsulosin  0.4 mg Oral Daily    SUBJECTIVE:  Afebrile overnight with no acute events. Continues to have pelvis pain. Function is improved since admission and was able work with therapies for some basic personal care.   Allergies  Allergen Reactions   Hydrocodone Itching   Morphine And Related Nausea And Vomiting     Review of Systems: Review of Systems  Constitutional:  Negative for chills, fever and weight loss.  Respiratory:  Negative for cough, shortness of breath and wheezing.   Cardiovascular:  Negative for chest pain and leg swelling.  Gastrointestinal:  Negative for abdominal pain, constipation, diarrhea, nausea and vomiting.  Musculoskeletal:        Positive for pelvic pain.   Skin:  Negative for rash.     OBJECTIVE: Vitals:   01/30/21 0618 01/30/21 0700 01/30/21 0759 01/30/21 1219  BP: 109/78  107/83 118/79  Pulse: 79 74  86  Resp: 14 18  19   Temp: 98.2 F (36.8 C)  98.2 F (36.8 C) 99 F (37.2 C)  TempSrc: Oral  Oral Oral  SpO2: 97% 96%  93%  Weight:      Height:       Body mass index is 28.31 kg/m.  Physical Exam Constitutional:  General: She is not in acute distress.    Appearance: She is well-developed.  Cardiovascular:     Rate and Rhythm: Normal rate and regular rhythm.     Heart sounds: Normal heart sounds.  Pulmonary:     Effort: Pulmonary effort is normal.     Breath sounds: Normal breath sounds.  Skin:    General: Skin is warm and dry.  Neurological:     Mental Status: She is alert and oriented to person, place, and time.  Psychiatric:         Behavior: Behavior normal.        Thought Content: Thought content normal.        Judgment: Judgment normal.    Lab Results Lab Results  Component Value Date   WBC 12.3 (H) 01/29/2021   HGB 8.3 (L) 01/29/2021   HCT 27.7 (L) 01/29/2021   MCV 81.0 01/29/2021   PLT 620 (H) 01/29/2021    Lab Results  Component Value Date   CREATININE 0.49 01/30/2021   BUN 16 01/30/2021   NA 131 (L) 01/30/2021   K 6.2 (H) 01/30/2021   CL 98 01/30/2021   CO2 22 01/30/2021    Lab Results  Component Value Date   ALT <5 01/26/2021   AST 10 (L) 01/26/2021   ALKPHOS 103 01/26/2021   BILITOT 0.4 01/26/2021     Microbiology: Recent Results (from the past 240 hour(s))  Culture, blood (Routine X 2) w Reflex to ID Panel     Status: Abnormal   Collection Time: 01/23/21  1:00 PM   Specimen: BLOOD RIGHT HAND  Result Value Ref Range Status   Specimen Description BLOOD RIGHT HAND  Final   Special Requests IN PEDIATRIC BOTTLE Blood Culture adequate volume  Final   Culture  Setup Time   Final    GRAM POSITIVE COCCI AEROBIC BOTTLE ONLY CRITICAL VALUE NOTED.  VALUE IS CONSISTENT WITH PREVIOUSLY REPORTED AND CALLED VALUE.    Culture (A)  Final    STAPHYLOCOCCUS AUREUS SUSCEPTIBILITIES PERFORMED ON PREVIOUS CULTURE WITHIN THE LAST 5 DAYS. Performed at [facility], [place] [address], [place], [place] [address]    Report Status 01/25/2021 FINAL  Final  Culture, blood (Routine X 2) w Reflex to ID Panel     Status: Abnormal (Preliminary result)   Collection Time: 01/24/21  6:54 AM   Specimen: BLOOD  Result Value Ref Range Status   Specimen Description BLOOD RIGHT ANTECUBITAL  Final   Special Requests   Final    AEROBIC BOTTLE ONLY Blood Culture results may not be optimal due to an inadequate volume of blood received in culture bottles   Culture  Setup Time   Final    GRAM POSITIVE COCCI AEROBIC BOTTLE ONLY CRITICAL VALUE NOTED.  VALUE IS CONSISTENT WITH PREVIOUSLY REPORTED AND CALLED VALUE.     Culture (A)  Final    STAPHYLOCOCCUS AUREUS Sent to [facility] for further susceptibility testing. Performed at [facility], [place] [address] [place], [place] [address]    Report Status PENDING  Incomplete  Culture, blood (routine x 2)     Status: None (Preliminary result)   Collection Time: 01/26/21  5:06 AM   Specimen: BLOOD RIGHT HAND  Result Value Ref Range Status   Specimen Description BLOOD RIGHT HAND  Final   Special Requests AEROBIC BOTTLE ONLY Blood Culture adequate volume  Final   Culture   Final    NO GROWTH 4 DAYS Performed at [facility], [place] [address] [place], [place] [address]    Report Status PENDING  Incomplete  Culture, blood (routine x 2)     Status: None (Preliminary result)   Collection Time: 01/26/21  5:06 AM   Specimen: BLOOD LEFT HAND  Result Value Ref Range Status   Specimen Description BLOOD LEFT HAND  Final   Special Requests AEROBIC BOTTLE ONLY Blood Culture adequate volume  Final   Culture   Final    NO GROWTH 4 DAYS Performed at [facility], [place] [address], [place], [place] [address]    Report Status PENDING  Incomplete  Aerobic/Anaerobic Culture w Gram Stain (surgical/deep wound)     Status: None (Preliminary result)   Collection Time: 01/26/21 10:58 AM   Specimen: Abscess  Result Value Ref Range Status   Specimen Description ABSCESS ASPIRATE  Final   Special Requests SYMPHYSIS PUBIS  Final   Gram Stain   Final    ABUNDANT WBC PRESENT, PREDOMINANTLY PMN FEW GRAM POSITIVE COCCI IN CLUSTERS Performed at [facility], [place] [address] [place], [place] [address]    Culture   Final    FEW METHICILLIN RESISTANT STAPHYLOCOCCUS AUREUS NO ANAEROBES ISOLATED; CULTURE IN PROGRESS FOR 5 DAYS    Report Status PENDING  Incomplete   Organism ID, Bacteria METHICILLIN RESISTANT STAPHYLOCOCCUS AUREUS  Final      Susceptibility   Methicillin resistant staphylococcus aureus - MIC*    CIPROFLOXACIN >=8 RESISTANT Resistant      ERYTHROMYCIN >=8 RESISTANT Resistant     GENTAMICIN <=0.5 SENSITIVE Sensitive     OXACILLIN >=4 RESISTANT Resistant     TETRACYCLINE >=16 RESISTANT Resistant     VANCOMYCIN <=0.5 SENSITIVE Sensitive     TRIMETH/SULFA >=320 RESISTANT Resistant     CLINDAMYCIN <=0.25 SENSITIVE Sensitive     RIFAMPIN <=0.5 SENSITIVE Sensitive     Inducible Clindamycin NEGATIVE Sensitive     * FEW METHICILLIN RESISTANT STAPHYLOCOCCUS AUREUS     Terri Piedra, NP [facility]  01/30/2021  1:06 PM

## 2021-01-30 NOTE — Progress Notes (Signed)
PROGRESS NOTE    Kelly [name]  LDJ:[id number] DOB: 1985/07/26 DOA: 01/19/2021 PCP: Pcp, No   Chief Complaint  Patient presents with   Abdominal Pain    Brief Narrative:  Kelly [name] is a 35 y.o. 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  Subjective: Lying comfortably in bed-no major issues overnight.  Continues to complain of pelvic pain.  Objective: Vitals: Blood pressure 107/83, pulse 74, temperature 98.2 F (36.8 C), temperature source Oral, resp. rate 18, height 5' 7"  (1.702 m), weight 82 kg, SpO2 96 %, unknown if currently breastfeeding.   Exam: Gen Exam:Alert awake-not in any distress HEENT:atraumatic, normocephalic Chest: B/L clear to auscultation anteriorly CVS:S1S2 regular Abdomen:soft non tender, non distended Extremities:no edema Neurology: Non focal Skin: no rash    Pertinent labs/x-rays NA: 131 K: 6.2 Mg: 2.0  Mircobiology: Blood culture on 9/8, 9/12 and 9/14: MRSA Blood culutre on 9/15: neg Pelvic abscess cx 9/15: MRSA  MRI pelvis on 9/13: Septic arthritis of pubic symphysis/osteomyelitis and surrounding abscesses Echo on 9/8: EF 03-00%, RV systolic function reduced.  RVSP 68.6 mmHg.  Severe TR.  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  Per ID-no benefit to repeating a echo/TEE-as it would not change management (patient not a candidate for surgery).  Continue empiric IV antibiotics per  infectious disease.     Bacterial vaginosis: Completed a course of oral Flagyl  Hyponatremia: Euvolemic-mild hyponatremia persists-maintain fluid restriction.  Hyperkalemia: Unclear etiology-we will treat with insulin/D50 and couple doses of Lokelma.  Repeat electrolytes later today.  Acute urinary retention: Failed voiding trial of few days back-on Flomax-she is very hesitant to remove Foley catheter-as she is still in significant amount of pain/pelvic pain.  Narcotic medications being optimized-reassurance provided-plan is to remove Foley catheter in the next day or two.  Prolonged QTC: Resolved-stable QTC on 9/9 and 9/18.    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  Currently on methadone 75 mg-and as needed Toradol/Dilaudid for breakthrough pain.  Twelve-lead EKG with stable QTC.  We will slowly increase methadone over the next few days.  On bowel regimen with MiraLAX/senna.  Microcytic anemia: No evidence of overt blood loss-hemoglobin stable compared to prior values.  DVT prophylaxis: Lovenox Code Status: Full Family Communication: None at bedside Disposition:   Status is: Inpatient  Remains inpatient appropriate because:IV treatments appropriate due to intensity of illness or inability to take PO  Dispo: The patient is from: Home              Anticipated d/c is to: Home              Patient currently is not medically stable to d/c.   Difficult to place patient Yes     Consultants:  ID Ortho IR   Data Reviewed: I have personally reviewed following labs and  imaging studies  CBC: Recent Labs  Lab 01/24/21 0653 01/25/21 0414 01/26/21 0506 01/27/21 0521 01/29/21 0101  WBC 22.9* 21.2* 16.6* 14.3* 12.3*  HGB  9.8* 10.1* 8.6* 9.0* 8.3*  HCT 32.3* 32.2* 28.9* 29.8* 27.7*  MCV 79.0* 79.5* 81.0 81.9 81.0  PLT 213 380 426* 480* 620*     Basic Metabolic Panel: Recent Labs  Lab 01/24/21 0327 01/24/21 0653 01/25/21 0414 01/26/21 0506 01/29/21 0101 01/30/21 0824  NA  --  125* 130* 131* 129* 131*  K  --  4.2 4.6 4.6 4.8 6.2*  CL  --  93* 95* 101 95* 98  CO2  --  23 21* 23 23 22   GLUCOSE  --  91 105* 96 96 76  BUN  --  12 14 15 11 16   CREATININE  --  0.52 0.57 0.52 0.53 0.49  CALCIUM  --  8.0* 8.3* 8.2* 8.4* 8.3*  MG 1.7 1.7  --   --  1.6* 2.0     GFR: Estimated Creatinine Clearance: 107.1 mL/min (by C-G formula based on SCr of 0.49 mg/dL).  Liver Function Tests: Recent Labs  Lab 01/26/21 0506  AST 10*  ALT <5  ALKPHOS 103  BILITOT 0.4  PROT 6.2*  ALBUMIN 1.5*     CBG: No results for input(s): GLUCAP in the last 168 hours.   Recent Results (from the past 240 hour(s))  Culture, blood (Routine X 2) w Reflex to ID Panel     Status: Abnormal   Collection Time: 01/23/21  1:00 PM   Specimen: BLOOD RIGHT HAND  Result Value Ref Range Status   Specimen Description BLOOD RIGHT HAND  Final   Special Requests IN PEDIATRIC BOTTLE Blood Culture adequate volume  Final   Culture  Setup Time   Final    GRAM POSITIVE COCCI AEROBIC BOTTLE ONLY CRITICAL VALUE NOTED.  VALUE IS CONSISTENT WITH PREVIOUSLY REPORTED AND CALLED VALUE.    Culture (A)  Final    STAPHYLOCOCCUS AUREUS SUSCEPTIBILITIES PERFORMED ON PREVIOUS CULTURE WITHIN THE LAST 5 DAYS. Performed at [facility], [place] [address], [place], [place] [address]    Report Status 01/25/2021 FINAL  Final  Culture, blood (Routine X 2) w Reflex to ID Panel     Status: Abnormal (Preliminary result)   Collection Time: 01/24/21  6:54 AM   Specimen: BLOOD  Result Value Ref Range Status   Specimen Description BLOOD RIGHT ANTECUBITAL  Final   Special Requests   Final    AEROBIC BOTTLE ONLY Blood Culture results may not be optimal due  to an inadequate volume of blood received in culture bottles   Culture  Setup Time   Final    GRAM POSITIVE COCCI AEROBIC BOTTLE ONLY CRITICAL VALUE NOTED.  VALUE IS CONSISTENT WITH PREVIOUSLY REPORTED AND CALLED VALUE.    Culture (A)  Final    STAPHYLOCOCCUS AUREUS Sent to [facility] for further susceptibility testing. Performed at [facility], [place] [address] [place], [place] [address]    Report Status PENDING  Incomplete  Culture, blood (routine x 2)     Status: None (Preliminary result)   Collection Time: 01/26/21  5:06 AM   Specimen: BLOOD RIGHT HAND  Result Value Ref Range Status   Specimen Description BLOOD RIGHT HAND  Final   Special Requests AEROBIC BOTTLE ONLY Blood Culture adequate volume  Final   Culture   Final    NO GROWTH 4 DAYS Performed at [facility], [place]  [address] [place], [place] [address]    Report Status PENDING  Incomplete  Culture, blood (routine x 2)     Status: None (Preliminary result)   Collection Time: 01/26/21  5:06 AM   Specimen: BLOOD LEFT HAND  Result Value Ref Range Status   Specimen Description BLOOD LEFT HAND  Final   Special Requests AEROBIC BOTTLE ONLY Blood Culture adequate volume  Final   Culture   Final    NO GROWTH 4 DAYS Performed at [facility], [place] [address] [place], [place] [address]    Report Status PENDING  Incomplete  Aerobic/Anaerobic Culture w Gram Stain (surgical/deep wound)     Status: None (Preliminary result)   Collection Time: 01/26/21 10:58 AM   Specimen: Abscess  Result Value Ref Range Status   Specimen Description ABSCESS ASPIRATE  Final   Special Requests SYMPHYSIS PUBIS  Final   Gram Stain   Final    ABUNDANT WBC PRESENT, PREDOMINANTLY PMN FEW GRAM POSITIVE COCCI IN CLUSTERS Performed at [facility], [place] [address], [place], [place] [address]    Culture   Final    FEW METHICILLIN RESISTANT STAPHYLOCOCCUS AUREUS NO ANAEROBES ISOLATED; CULTURE IN PROGRESS FOR 5 DAYS    Report  Status PENDING  Incomplete   Organism ID, Bacteria METHICILLIN RESISTANT STAPHYLOCOCCUS AUREUS  Final      Susceptibility   Methicillin resistant staphylococcus aureus - MIC*    CIPROFLOXACIN >=8 RESISTANT Resistant     ERYTHROMYCIN >=8 RESISTANT Resistant     GENTAMICIN <=0.5 SENSITIVE Sensitive     OXACILLIN >=4 RESISTANT Resistant     TETRACYCLINE >=16 RESISTANT Resistant     VANCOMYCIN <=0.5 SENSITIVE Sensitive     TRIMETH/SULFA >=320 RESISTANT Resistant     CLINDAMYCIN <=0.25 SENSITIVE Sensitive     RIFAMPIN <=0.5 SENSITIVE Sensitive     Inducible Clindamycin NEGATIVE Sensitive     * FEW METHICILLIN RESISTANT STAPHYLOCOCCUS AUREUS          Radiology Studies: No results found.      Scheduled Meds:  Chlorhexidine Gluconate Cloth  6 each Topical Q0600   insulin aspart  6 Units Intravenous Once   Followed by   dextrose  25 mL Intravenous Once   enoxaparin (LOVENOX) injection  40 mg Subcutaneous Q24H   methadone  75 mg Oral Daily   polyethylene glycol  17 g Oral Daily   senna-docusate  1 tablet Oral BID   sodium zirconium cyclosilicate  5 g Oral BID   tamsulosin  0.4 mg Oral Daily   Continuous Infusions:  sodium chloride Stopped (01/28/21 0504)   DAPTOmycin (CUBICIN)  IV 650 mg (01/29/21 2004)     LOS: 11 days        Oren Binet, MD [facility]   To contact the attending provider between 7A-7P or the covering provider during after hours 7P-7A, please log into the web site [email or web address] and access using universal [place] password for that web site. If you do not have the password, please call the hospital operator.  01/30/2021, 11:02 AM

## 2021-01-30 NOTE — Progress Notes (Signed)
Physical Therapy Treatment Patient Details Name: Kelly [name] MRN: [id number] DOB: [birth year] Today's Date: 01/30/2021   History of Present Illness 35 y.o. female presents to [facility] hospital on 01/19/2021 with pelvic pain. Pt found to have sepsis 2/2 MRSA bacteremia. +pelvic abscesses with IR drainage 9/15; septic arthritis of pubic symphysis   PMH includes HTN, systolic CHF with moderate pulmonary hypertension and severe TR, polysubstance abuse including history of IV drug use, recurrent history of endocarditis, C. difficile colitis.    PT Comments    Patient with less pain and better step length and foot clearance than previous visit. Noted LLE weaker than RLE and pt reports it has been this way since she had endocarditis. Educated in exercises to do on her own. Instructed to continue to do her bathing at the sink with NT assist.    Recommendations for follow up therapy are one component of a multi-disciplinary discharge planning process, led by the attending physician.  Recommendations may be updated based on patient status, additional functional criteria and insurance authorization.  Follow Up Recommendations  Home health PT     Equipment Recommendations  Wheelchair (measurements PT)    Recommendations for Other Services       Precautions / Restrictions Precautions Precautions: Fall     Mobility  Bed Mobility Overal bed mobility: Needs Assistance Bed Mobility: Supine to Sit     Supine to sit: HOB elevated;Min assist Sit to supine: Mod assist   General bed mobility comments: +rail, cues for sequencing, increased time, assist with BLE (tries to move them together to minimize pain)    Transfers Overall transfer level: Needs assistance Equipment used: Rolling walker (2 wheeled) Transfers: Sit to/from Stand Sit to Stand: Min assist;From elevated surface (pt requesting bed be elevated to decr pain) Stand pivot transfers: Min assist       General transfer comment: cues  for hand placement and sequencing (one hand on RW, PT stabilizing other side of RW and pt's other hand pushing off bed), increased time  Ambulation/Gait Ambulation/Gait assistance: Min guard Gait Distance (Feet): 30 Feet Assistive device: Rolling walker (2 wheeled) Gait Pattern/deviations: Step-to pattern;Decreased stride length Gait velocity: very slow   General Gait Details: slow, incr reliance on bil UEs on RW; improved step length and foot clearance   Stairs             Wheelchair Mobility    Modified Rankin (Stroke Patients Only)       Balance Overall balance assessment: Needs assistance Sitting-balance support: Feet supported;Single extremity supported;Bilateral upper extremity supported Sitting balance-Leahy Scale: Fair Sitting balance - Comments: minA, posterior lean Postural control: Posterior lean Standing balance support: During functional activity;No upper extremity supported Standing balance-Leahy Scale: Fair Standing balance comment: able to stand at sink without UE support and brush teeth                            Cognition Arousal/Alertness: Awake/alert Behavior During Therapy: WFL for tasks assessed/performed Overall Cognitive Status: Within Functional Limits for tasks assessed                                        Exercises General Exercises - Lower Extremity Quad Sets: AROM;Both;5 reps (5 sec hold) Hip Flexion/Marching: AROM;Both;5 reps;Supine;Seated;Standing (5 reps each leg in each position) Other Exercises Other Exercises: bridging x 5 reps with good tolerance  General Comments        Pertinent Vitals/Pain Pain Assessment: 0-10 Pain Score: 7  Pain Location: pelvis and LEs Pain Descriptors / Indicators: Moaning;Grimacing;Guarding Pain Intervention(s): Limited activity within patient's tolerance;Monitored during session;Premedicated before session    Home Living                      Prior  Function            PT Goals (current goals can now be found in the care plan section) Acute Rehab PT Goals Patient Stated Goal: to reduce pain Time For Goal Achievement: 02/04/21 Potential to Achieve Goals: Fair Progress towards PT goals: Progressing toward goals    Frequency    Min 3X/week      PT Plan Current plan remains appropriate    Co-evaluation              AM-PAC PT "6 Clicks" Mobility   Outcome Measure  Help needed turning from your back to your side while in a flat bed without using bedrails?: A Little Help needed moving from lying on your back to sitting on the side of a flat bed without using bedrails?: A Little Help needed moving to and from a bed to a chair (including a wheelchair)?: A Little Help needed standing up from a chair using your arms (e.g., wheelchair or bedside chair)?: A Little Help needed to walk in hospital room?: A Little Help needed climbing 3-5 steps with a railing? : A Lot 6 Click Score: 17    End of Session   Activity Tolerance: Patient limited by pain;No increased pain Patient left: with call bell/phone within reach;in bed Nurse Communication: Mobility status PT Visit Diagnosis: Unsteadiness on feet (R26.81);Other abnormalities of gait and mobility (R26.89);Muscle weakness (generalized) (M62.81);Pain Pain - part of body:  (pelvis and LEs)     Time: 1937-9024 PT Time Calculation (min) (ACUTE ONLY): 40 min  Charges:  $Gait Training: 8-22 mins $Therapeutic Exercise: 23-37 mins                      Arby Barrette, PT Pager [phone]    Rexanne Mano 01/30/2021, 1:59 PM

## 2021-01-31 DIAGNOSIS — M86159 Other acute osteomyelitis, unspecified femur: Secondary | ICD-10-CM | POA: Diagnosis not present

## 2021-01-31 DIAGNOSIS — K5900 Constipation, unspecified: Secondary | ICD-10-CM | POA: Diagnosis not present

## 2021-01-31 DIAGNOSIS — E871 Hypo-osmolality and hyponatremia: Secondary | ICD-10-CM | POA: Diagnosis not present

## 2021-01-31 DIAGNOSIS — R7881 Bacteremia: Secondary | ICD-10-CM | POA: Diagnosis not present

## 2021-01-31 LAB — BASIC METABOLIC PANEL
Anion gap: 10 (ref 5–15)
BUN: 13 mg/dL (ref 6–20)
CO2: 24 mmol/L (ref 22–32)
Calcium: 8.8 mg/dL — ABNORMAL LOW (ref 8.9–10.3)
Chloride: 97 mmol/L — ABNORMAL LOW (ref 98–111)
Creatinine, Ser: 0.55 mg/dL (ref 0.44–1.00)
GFR, Estimated: >60 mL/min (ref >60)
Glucose, Bld: 89 mg/dL (ref 70–99)
Potassium: 5.3 mmol/L — ABNORMAL HIGH (ref 3.5–5.1)
Sodium: 131 mmol/L — ABNORMAL LOW (ref 135–145)

## 2021-01-31 LAB — CULTURE, BLOOD (ROUTINE X 2)
Culture: NO GROWTH
Culture: NO GROWTH
Special Requests: ADEQUATE
Special Requests: ADEQUATE

## 2021-01-31 LAB — AEROBIC/ANAEROBIC CULTURE W GRAM STAIN (SURGICAL/DEEP WOUND)

## 2021-01-31 LAB — MAGNESIUM: Magnesium: 1.7 mg/dL (ref 1.7–2.4)

## 2021-01-31 MED ORDER — METHADONE HCL 10 MG PO TABS
100.0000 mg | ORAL_TABLET | Freq: Every day | ORAL | Status: DC
  Administered 2021-01-31 – 2021-02-22 (×23): 100 mg via ORAL
  Filled 2021-01-31 (×24): qty 10

## 2021-01-31 MED ORDER — SODIUM ZIRCONIUM CYCLOSILICATE 10 G PO PACK
10.0000 g | PACK | Freq: Two times a day (BID) | ORAL | Status: AC
  Administered 2021-01-31 (×2): 10 g via ORAL
  Filled 2021-01-31 (×2): qty 1

## 2021-01-31 MED ORDER — LIP MEDEX EX OINT
TOPICAL_OINTMENT | CUTANEOUS | Status: DC | PRN
  Filled 2021-01-31: qty 7

## 2021-01-31 MED ORDER — MAGNESIUM SULFATE 4 GM/100ML IV SOLN
4.0000 g | Freq: Once | INTRAVENOUS | Status: AC
  Administered 2021-01-31: 4 g via INTRAVENOUS
  Filled 2021-01-31: qty 100

## 2021-01-31 MED ORDER — NALOXEGOL OXALATE 12.5 MG PO TABS
12.5000 mg | ORAL_TABLET | Freq: Every day | ORAL | Status: DC
  Administered 2021-01-31 – 2021-02-02 (×3): 12.5 mg via ORAL
  Filled 2021-01-31 (×4): qty 1

## 2021-01-31 NOTE — TOC Progression Note (Signed)
Transition of Care [facility]) - Progression Note    Patient Details  Name: Kelly [name] MRN: [id number] Date of Birth: 1985-10-11  [facility] CM/SW Contact  Carles Collet, RN Phone Number: 01/31/2021, 12:25 PM  Clinical Narrative:    Patient will remain in house through 10/27 to finish cource of IV Abx. Spoke w patient at bedside.  She states that she was living in [place] prior to admission. She has no family support, her mother, who has custody of 3 of her children ages 30, 2, and 4 months has cut off communication with her. She mentions a friend that she could ask to stay with if needed after DC. She shares that she has a 36 year old that she gave up for adoption. Patient shares that she wants to go to a rehab that can keep her 62mto a year. She has [facility] several [facility]abs already, [facility] Reviewed this with CSW familiar with her from past admissions. The only rehab facility that would take is [facility] due to her methadone use.  TOC will need to review this with her closer to DC, she declined going there on previous admissions.   Patient currently does not go to PCP. [facility] is listed on Medicaid card.      Expected Discharge Plan: Home/Self Care Barriers to Discharge: Continued Medical Work up  Expected Discharge Plan and Services Expected Discharge Plan: Home/Self Care In-house Referral: Clinical Social Work Discharge Planning Services: CM Consult   Living arrangements for the past 2 months: Hotel/Motel                                       Social Determinants of Health (SDOH) Interventions    Readmission Risk Interventions Readmission Risk Prevention Plan 08/25/2020 07/30/2018  Transportation Screening Complete Complete  Transportation Screening Comment [facility] for the patient. -  PCP or  Specialist Appt within 5-7 Days Complete -  Not Complete comments 08/29/20 OB/GYN appointment scheduled -  Home Care Screening Complete -  Medication Review (RN Care Manager) - Complete  PCP or Specialist appointment within 3-5 days of discharge - Complete  [place] or [place]- Not Complete  HRI or Home Care Consult Pt Refusal Comments - N/A  SW Recovery Care/Counseling Consult - Complete  Palliative Care Screening - Not Complete  Comments - N/A  Skilled Nursing Facility - Not Complete  SNF Comments - N/A  Some recent data might be hidden

## 2021-01-31 NOTE — Progress Notes (Signed)
PROGRESS NOTE    Kelly [name]  IWP:[id number] DOB: [birth year] DOA: 01/19/2021 PCP: Pcp, No   Chief Complaint  Patient presents with   Abdominal Pain    Brief Narrative:  Kelly [name] is a 35 y.o. 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  Subjective: Lying comfortably in bed-no major issues.  Pain is relatively well controlled.  Per patient/RN-had a BM yesterday-apparently stool was very hard in consistency.  Objective: Vitals: Blood pressure 93/62, pulse 97, temperature 98.8 F (37.1 C), temperature source Oral, resp. rate 17, height 5' 7"  (1.702 m), weight 82 kg, SpO2 (!) 89 %, unknown if currently breastfeeding.   Exam: Gen Exam:Alert awake-not in any distress HEENT:atraumatic, normocephalic Chest: B/L clear to auscultation anteriorly CVS:S1S2 regular Abdomen:soft non tender, non distended Extremities:no edema Neurology: Non focal Skin: no rash   Pertinent labs/x-rays NA: 131 K: 5.3 Mg: 1.7  Mircobiology: Blood culture on 9/8, 9/12 and 9/14: MRSA Blood culutre on 9/15: neg Pelvic abscess cx 9/15: MRSA  MRI pelvis on 9/13: Septic arthritis of pubic symphysis/osteomyelitis and surrounding abscesses Echo on 9/8: EF 50-53%, RV systolic function reduced.  RVSP 68.6 mmHg.  Severe TR.  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  Per ID-no benefit to repeating a echo/TEE-as it would not change  management (patient not a candidate for surgery).  ID recommending daptomycin 6 weeks from 9/15.  Bacterial vaginosis: Completed a course of oral Flagyl  Hyponatremia: Euvolemic-mild hyponatremia persists-maintain fluid restriction.  Hyperkalemia: Mild-unclear etiology-continue Lokelma today.  Acute urinary retention: Failed voiding trial of few days back-on Flomax-since pain is well controlled-we will try a voiding trial today.  Discontinue Foley catheter today.    Prolonged QTC: Resolved-stable QTC on 9/9 and 9/18.    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 difficile infection in the past  History of intravenous drug use (UDS on 9/9 positive for benzos, opiates, amphetamine and cocaine): Continues to have some amount of pelvic pain-increase methadone to 100 mg today (claims to be 120 mg prior to this hospitalization).  In spite of being on MiraLAX/senna-continues to have hard stools-start Movantik.    Microcytic anemia: No evidence of overt blood loss-hemoglobin stable compared to prior values.  DVT prophylaxis: Lovenox Code Status: Full Family Communication: None at bedside Disposition:   Status is: Inpatient  Remains inpatient appropriate because:IV treatments appropriate due to intensity of illness or inability to take PO  Dispo: The patient is from: Home              Anticipated d/c is to: Home              Patient currently is not medically stable to d/c.   Difficult to place patient Yes     Consultants:  ID Ortho IR   Data Reviewed: I have personally reviewed following labs and imaging studies  CBC: Recent Labs  Lab 01/25/21 0414 01/26/21 0506 01/27/21 0521 01/29/21 0101  WBC 21.2* 16.6* 14.3* 12.3*  HGB 10.1* 8.6* 9.0* 8.3*  HCT 32.2* 28.9* 29.8* 27.7*  MCV 79.5* 81.0 81.9 81.0  PLT 380 426* 480* 620*     Basic Metabolic Panel: Recent Labs  Lab 01/26/21 0506 01/29/21 0101  01/30/21 0824 01/30/21 1607 01/31/21 0611 01/31/21 0755  NA 131* 129* 131* 133*  --  131*  K 4.6 4.8 6.2* 4.9  --  5.3*  CL 101 95* 98 97*  --  97*  CO2 23 23 22 25   --  24  GLUCOSE 96 96 76 101*  --  89  BUN 15 11 16 15   --  13  CREATININE 0.52 0.53 0.49 0.55  --  0.55  CALCIUM 8.2* 8.4* 8.3* 8.4*  --  8.8*  MG  --  1.6* 2.0  --  1.7  --      GFR: Estimated Creatinine Clearance: 107.1 mL/min (by C-G formula based on SCr of 0.55 mg/dL).  Liver Function Tests: Recent Labs  Lab 01/26/21 0506  AST 10*  ALT <5  ALKPHOS 103  BILITOT 0.4  PROT 6.2*  ALBUMIN 1.5*     CBG: No results for input(s): GLUCAP in the last 168 hours.   Recent Results (from the past 240 hour(s))  Culture, blood (Routine X 2) w Reflex to ID Panel     Status: Abnormal   Collection Time: 01/23/21  1:00 PM   Specimen: BLOOD RIGHT HAND  Result Value Ref Range Status   Specimen Description BLOOD RIGHT HAND  Final   Special Requests IN PEDIATRIC BOTTLE Blood Culture adequate volume  Final   Culture  Setup Time   Final    GRAM POSITIVE COCCI AEROBIC BOTTLE ONLY CRITICAL VALUE NOTED.  VALUE IS CONSISTENT WITH PREVIOUSLY REPORTED AND CALLED VALUE.    Culture (A)  Final    STAPHYLOCOCCUS AUREUS SUSCEPTIBILITIES PERFORMED ON PREVIOUS CULTURE WITHIN THE LAST 5 DAYS. Performed at [facility], [place] [address], [place], [place] [address]    Report Status 01/25/2021 FINAL  Final  Culture, blood (Routine X 2) w Reflex to ID Panel     Status: Abnormal (Preliminary result)   Collection Time: 01/24/21  6:54 AM   Specimen: BLOOD  Result Value Ref Range Status   Specimen Description BLOOD RIGHT ANTECUBITAL  Final   Special Requests   Final    AEROBIC BOTTLE ONLY Blood Culture results may not be optimal due to an inadequate volume of blood received in culture bottles   Culture  Setup Time   Final    GRAM POSITIVE COCCI AEROBIC BOTTLE ONLY CRITICAL VALUE NOTED.  VALUE IS CONSISTENT WITH PREVIOUSLY  REPORTED AND CALLED VALUE.    Culture (A)  Final    STAPHYLOCOCCUS AUREUS Sent to [place] for further susceptibility testing. Performed at [facility], [place] [address], [place], [place] [address]    Report Status PENDING  Incomplete  Culture, blood (routine x 2)     Status: None   Collection Time: 01/26/21  5:06 AM   Specimen: BLOOD RIGHT HAND  Result Value Ref Range Status   Specimen Description BLOOD RIGHT HAND  Final   Special Requests AEROBIC BOTTLE ONLY Blood Culture adequate volume  Final   Culture   Final    NO GROWTH 5 DAYS Performed at [facility], [place] [address] [place], [place] [address]    Report Status 01/31/2021 FINAL  Final  Culture, blood (routine x 2)     Status: None   Collection Time: 01/26/21  5:06 AM  Specimen: BLOOD LEFT HAND  Result Value Ref Range Status   Specimen Description BLOOD LEFT HAND  Final   Special Requests AEROBIC BOTTLE ONLY Blood Culture adequate volume  Final   Culture   Final    NO GROWTH 5 DAYS Performed at [facility], [address] [place], [place] [address]    Report Status 01/31/2021 FINAL  Final  Aerobic/Anaerobic Culture w Gram Stain (surgical/deep wound)     Status: None   Collection Time: 01/26/21 10:58 AM   Specimen: Abscess  Result Value Ref Range Status   Specimen Description ABSCESS ASPIRATE  Final   Special Requests SYMPHYSIS PUBIS  Final   Gram Stain   Final    ABUNDANT WBC PRESENT, PREDOMINANTLY PMN FEW GRAM POSITIVE COCCI IN CLUSTERS    Culture   Final    FEW METHICILLIN RESISTANT STAPHYLOCOCCUS AUREUS NO ANAEROBES ISOLATED Performed at [facility], [place] [address] [place], [place] [address]    Report Status 01/31/2021 FINAL  Final   Organism ID, Bacteria METHICILLIN RESISTANT STAPHYLOCOCCUS AUREUS  Final      Susceptibility   Methicillin resistant staphylococcus aureus - MIC*    CIPROFLOXACIN >=8 RESISTANT Resistant     ERYTHROMYCIN >=8 RESISTANT Resistant     GENTAMICIN <=0.5  SENSITIVE Sensitive     OXACILLIN >=4 RESISTANT Resistant     TETRACYCLINE >=16 RESISTANT Resistant     VANCOMYCIN <=0.5 SENSITIVE Sensitive     TRIMETH/SULFA >=320 RESISTANT Resistant     CLINDAMYCIN <=0.25 SENSITIVE Sensitive     RIFAMPIN <=0.5 SENSITIVE Sensitive     Inducible Clindamycin NEGATIVE Sensitive     * FEW METHICILLIN RESISTANT STAPHYLOCOCCUS AUREUS          Radiology Studies: [place] EKG SITE RITE  Result Date: 01/30/2021 If Site Rite image not attached, placement could not be confirmed due to current cardiac rhythm.       Scheduled Meds:  Chlorhexidine Gluconate Cloth  6 each Topical Q0600   enoxaparin (LOVENOX) injection  40 mg Subcutaneous Q24H   methadone  100 mg Oral Daily   naloxegol oxalate  12.5 mg Oral QAC breakfast   polyethylene glycol  17 g Oral Daily   senna-docusate  1 tablet Oral BID   tamsulosin  0.4 mg Oral Daily   Continuous Infusions:  sodium chloride Stopped (01/28/21 0504)   DAPTOmycin (CUBICIN)  IV 650 mg (01/30/21 2101)     LOS: 12 days        Oren Binet, MD [facility]   To contact the attending provider between 7A-7P or the covering provider during after hours 7P-7A, please log into the web site [email or web address] and access using universal [place] password for that web site. If you do not have the password, please call the hospital operator.  01/31/2021, 2:25 PM

## 2021-01-31 NOTE — Progress Notes (Addendum)
2050- During shift assessment patient complaining of abdominal pain. States her bladder "Feels full & that she is unable to void on her own. Bladder scan performed, 769 mL. Paged & notified Dr. Hal Hope. Orders for straight cath placed. Emptied 700 mL  0245- patient called out complaining of bladder pressure. Bladder scan performed, 783 mL. Notified Dr. Hal Hope, received orders for in and out straight cath. Emptied 850 mL

## 2021-02-01 ENCOUNTER — Inpatient Hospital Stay: Payer: Self-pay

## 2021-02-01 DIAGNOSIS — R7881 Bacteremia: Secondary | ICD-10-CM | POA: Diagnosis not present

## 2021-02-01 DIAGNOSIS — E871 Hypo-osmolality and hyponatremia: Secondary | ICD-10-CM | POA: Diagnosis not present

## 2021-02-01 DIAGNOSIS — K5900 Constipation, unspecified: Secondary | ICD-10-CM | POA: Diagnosis not present

## 2021-02-01 DIAGNOSIS — M86159 Other acute osteomyelitis, unspecified femur: Secondary | ICD-10-CM | POA: Diagnosis not present

## 2021-02-01 LAB — BASIC METABOLIC PANEL
Anion gap: 12 (ref 5–15)
BUN: 13 mg/dL (ref 6–20)
CO2: 24 mmol/L (ref 22–32)
Calcium: 8.8 mg/dL — ABNORMAL LOW (ref 8.9–10.3)
Chloride: 97 mmol/L — ABNORMAL LOW (ref 98–111)
Creatinine, Ser: 0.61 mg/dL (ref 0.44–1.00)
GFR, Estimated: >60 mL/min (ref >60)
Glucose, Bld: 126 mg/dL — ABNORMAL HIGH (ref 70–99)
Potassium: 5 mmol/L (ref 3.5–5.1)
Sodium: 133 mmol/L — ABNORMAL LOW (ref 135–145)

## 2021-02-01 LAB — CBC
HCT: 24.8 % — ABNORMAL LOW (ref 36.0–46.0)
Hemoglobin: 7.6 g/dL — ABNORMAL LOW (ref 12.0–15.0)
MCH: 25.1 pg — ABNORMAL LOW (ref 26.0–34.0)
MCHC: 30.6 g/dL (ref 30.0–36.0)
MCV: 81.8 fL (ref 80.0–100.0)
Platelets: 626 10*3/uL — ABNORMAL HIGH (ref 150–400)
RBC: 3.03 MIL/uL — ABNORMAL LOW (ref 3.87–5.11)
RDW: 20.5 % — ABNORMAL HIGH (ref 11.5–15.5)
WBC: 10.4 10*3/uL (ref 4.0–10.5)
nRBC: 0 % (ref 0.0–0.2)

## 2021-02-01 LAB — MAGNESIUM: Magnesium: 2 mg/dL (ref 1.7–2.4)

## 2021-02-01 LAB — CK: Total CK: 12 U/L — ABNORMAL LOW (ref 38–234)

## 2021-02-01 MED ORDER — SODIUM CHLORIDE 0.9% FLUSH
10.0000 mL | INTRAVENOUS | Status: DC | PRN
  Administered 2021-02-07 (×2): 10 mL

## 2021-02-01 MED ORDER — SODIUM CHLORIDE 0.9% FLUSH
10.0000 mL | Freq: Two times a day (BID) | INTRAVENOUS | Status: DC
  Administered 2021-02-01 – 2021-02-17 (×34): 10 mL
  Administered 2021-02-18: 20 mL
  Administered 2021-02-18 – 2021-02-22 (×8): 10 mL

## 2021-02-01 MED ORDER — SODIUM ZIRCONIUM CYCLOSILICATE 10 G PO PACK
10.0000 g | PACK | Freq: Two times a day (BID) | ORAL | Status: DC
  Administered 2021-02-01 – 2021-02-02 (×3): 10 g via ORAL
  Filled 2021-02-01 (×3): qty 1

## 2021-02-01 NOTE — Progress Notes (Signed)
PROGRESS NOTE    Kelly [name]  ZDG:[id number] DOB: 1986/04/19 DOA: 01/19/2021 PCP: Pcp, No   Chief Complaint  Patient presents with   Abdominal Pain    Brief Narrative:  Kelly [name] is a 35 y.o. 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  Subjective: Foley catheter removed yesterday-developed multiple episodes of acute urinary retention requiring in/out cath last night.  Complains of constipation-hard stools.  Objective: Vitals: Blood pressure 107/82, pulse 86, temperature 98.5 F (36.9 C), temperature source Oral, resp. rate 18, height 5' 7"  (1.702 m), weight 82 kg, SpO2 92 %, unknown if currently breastfeeding.   Exam: Gen Exam:Alert awake-not in any distress HEENT:atraumatic, normocephalic Chest: B/L clear to auscultation anteriorly CVS:S1S2 regular Abdomen:soft non tender, non distended Extremities:no edema Neurology: Non focal Skin: no rash   Pertinent labs/x-rays Hb: 7.6 PLT: 66 NA: 133  K: 5.0 Mg: 2.0  Mircobiology: Blood culture on 9/8, 9/12 and 9/14: MRSA Blood culutre on 9/15: neg Pelvic abscess cx 9/15: MRSA  MRI pelvis on 9/13: Septic arthritis of pubic symphysis/osteomyelitis and surrounding abscesses Echo on 9/8: EF 95-18%, RV systolic function reduced.  RVSP 68.6 mmHg.  Severe TR.  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  Per ID-no benefit to repeating a echo/TEE-as it would not change  management (patient not a candidate for surgery).  ID recommending daptomycin 6 weeks from 9/15.  Bacterial vaginosis: Completed a course of oral Flagyl  Hyponatremia: Euvolemic-mild hyponatremia persists-maintain fluid restriction.  Hyperkalemia: Mild-unclear etiology-we will maintain on Lokelma for a few days.  Normocytic anemia: Slight drop in hemoglobin-I suspect this is due to acute illness-she has no evidence of blood loss.  Transfuse if hemoglobin less than 7.  Acute urinary retention: Failed voiding trial again on 9/20-Foley replaced on 9/21.  We will attempt voiding trial in 2 weeks (since this is his second failure).  Continue Flomax.  Prolonged QTC: Resolved-stable QTC on 9/9 and 9/18.    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 difficile infection in the past  History of intravenous drug use (UDS on 9/9 positive for benzos, opiates, amphetamine and cocaine): Continues to have some amount of pelvic pain-increase methadone to 100 mg today (claims to be 120 mg prior to this hospitalization).  Continues to complain of constipation in spite of being on MiraLAX/senna/Movantik-have asked nurse to give 1 dose of Dulcolax suppository today.  DVT prophylaxis: Lovenox Code Status: Full Family Communication: None at bedside Disposition:   Status is: Inpatient  Remains inpatient appropriate because:IV treatments appropriate due to intensity of illness or inability to take PO  Dispo: The patient is from: Home              Anticipated d/c is to: Home              Patient currently is not medically stable to d/c.   Difficult to place patient Yes     Consultants:  ID Ortho IR   Data Reviewed: I have personally reviewed following labs  and imaging studies  CBC: Recent Labs  Lab 01/26/21 0506 01/27/21 0521 01/29/21 0101 02/01/21 0549  WBC 16.6* 14.3* 12.3* 10.4  HGB 8.6* 9.0* 8.3* 7.6*  HCT 28.9* 29.8* 27.7*  24.8*  MCV 81.0 81.9 81.0 81.8  PLT 426* 480* 620* 626*     Basic Metabolic Panel: Recent Labs  Lab 01/29/21 0101 01/30/21 0824 01/30/21 1607 01/31/21 0611 01/31/21 0755 02/01/21 0442  NA 129* 131* 133*  --  131* 133*  K 4.8 6.2* 4.9  --  5.3* 5.0  CL 95* 98 97*  --  97* 97*  CO2 23 22 25   --  24 24  GLUCOSE 96 76 101*  --  89 126*  BUN 11 16 15   --  13 13  CREATININE 0.53 0.49 0.55  --  0.55 0.61  CALCIUM 8.4* 8.3* 8.4*  --  8.8* 8.8*  MG 1.6* 2.0  --  1.7  --  2.0     GFR: Estimated Creatinine Clearance: 107.1 mL/min (by C-G formula based on SCr of 0.61 mg/dL).  Liver Function Tests: Recent Labs  Lab 01/26/21 0506  AST 10*  ALT <5  ALKPHOS 103  BILITOT 0.4  PROT 6.2*  ALBUMIN 1.5*     CBG: No results for input(s): GLUCAP in the last 168 hours.   Recent Results (from the past 240 hour(s))  Culture, blood (Routine X 2) w Reflex to ID Panel     Status: Abnormal   Collection Time: 01/23/21  1:00 PM   Specimen: BLOOD RIGHT HAND  Result Value Ref Range Status   Specimen Description BLOOD RIGHT HAND  Final   Special Requests IN PEDIATRIC BOTTLE Blood Culture adequate volume  Final   Culture  Setup Time   Final    GRAM POSITIVE COCCI AEROBIC BOTTLE ONLY CRITICAL VALUE NOTED.  VALUE IS CONSISTENT WITH PREVIOUSLY REPORTED AND CALLED VALUE.    Culture (A)  Final    STAPHYLOCOCCUS AUREUS SUSCEPTIBILITIES PERFORMED ON PREVIOUS CULTURE WITHIN THE LAST 5 DAYS. Performed at [facility], [place] [address] [place], [place] [address]    Report Status 01/25/2021 FINAL  Final  Culture, blood (Routine X 2) w Reflex to ID Panel     Status: Abnormal   Collection Time: 01/24/21  6:54 AM   Specimen: BLOOD  Result Value Ref Range Status   Specimen Description BLOOD RIGHT ANTECUBITAL  Final   Special Requests   Final    AEROBIC BOTTLE ONLY Blood Culture results may not be optimal due to an inadequate volume of blood received in culture bottles   Culture  Setup Time    Final    GRAM POSITIVE COCCI AEROBIC BOTTLE ONLY CRITICAL VALUE NOTED.  VALUE IS CONSISTENT WITH PREVIOUSLY REPORTED AND CALLED VALUE.    Culture (A)  Final    STAPHYLOCOCCUS AUREUS SEE SEPARATE REPORT Performed at [facility], [place] [address] [place], [place] [address]    Report Status 01/31/2021 FINAL  Final  Culture, blood (routine x 2)     Status: None   Collection Time: 01/26/21  5:06 AM   Specimen: BLOOD RIGHT HAND  Result Value Ref Range Status   Specimen Description BLOOD RIGHT HAND  Final   Special Requests AEROBIC BOTTLE ONLY Blood Culture adequate volume  Final   Culture   Final    NO GROWTH 5 DAYS Performed at [facility], [place] [address] [place], [place] [address]    Report Status 01/31/2021 FINAL  Final  Culture, blood (routine x 2)     Status: None   Collection Time: 01/26/21  5:06 AM   Specimen: BLOOD LEFT HAND  Result Value Ref Range Status   Specimen Description BLOOD LEFT HAND  Final   Special Requests AEROBIC BOTTLE ONLY Blood Culture adequate volume  Final   Culture   Final    NO GROWTH 5 DAYS Performed at [facility], [address], [place], [place] [address]    Report Status 01/31/2021 FINAL  Final  Aerobic/Anaerobic Culture w Gram Stain (surgical/deep wound)     Status: None   Collection Time: 01/26/21 10:58 AM   Specimen: Abscess  Result Value Ref Range Status   Specimen Description ABSCESS ASPIRATE  Final   Special Requests SYMPHYSIS PUBIS  Final   Gram Stain   Final    ABUNDANT WBC PRESENT, PREDOMINANTLY PMN FEW GRAM POSITIVE COCCI IN CLUSTERS    Culture   Final    FEW METHICILLIN RESISTANT STAPHYLOCOCCUS AUREUS NO ANAEROBES ISOLATED Performed at [facility], [place] [address] [place], [place] [address]    Report Status 01/31/2021 FINAL  Final   Organism ID, Bacteria METHICILLIN RESISTANT STAPHYLOCOCCUS AUREUS  Final      Susceptibility   Methicillin resistant staphylococcus aureus - MIC*    CIPROFLOXACIN >=8  RESISTANT Resistant     ERYTHROMYCIN >=8 RESISTANT Resistant     GENTAMICIN <=0.5 SENSITIVE Sensitive     OXACILLIN >=4 RESISTANT Resistant     TETRACYCLINE >=16 RESISTANT Resistant     VANCOMYCIN <=0.5 SENSITIVE Sensitive     TRIMETH/SULFA >=320 RESISTANT Resistant     CLINDAMYCIN <=0.25 SENSITIVE Sensitive     RIFAMPIN <=0.5 SENSITIVE Sensitive     Inducible Clindamycin NEGATIVE Sensitive     * FEW METHICILLIN RESISTANT STAPHYLOCOCCUS AUREUS          Radiology Studies: No results found.      Scheduled Meds:  Chlorhexidine Gluconate Cloth  6 each Topical Q0600   enoxaparin (LOVENOX) injection  40 mg Subcutaneous Q24H   methadone  100 mg Oral Daily   naloxegol oxalate  12.5 mg Oral QAC breakfast   polyethylene glycol  17 g Oral Daily   senna-docusate  1 tablet Oral BID   sodium zirconium cyclosilicate  10 g Oral BID   tamsulosin  0.4 mg Oral Daily   Continuous Infusions:  sodium chloride Stopped (01/28/21 0504)   DAPTOmycin (CUBICIN)  IV 650 mg (01/31/21 2144)     LOS: 13 days        Oren Binet, MD [facility]   To contact the attending provider between 7A-7P or the covering provider during after hours 7P-7A, please log into the web site [email or web address] and access using universal [place] password for that web site. If you do not have the password, please call the hospital operator.  02/01/2021, 11:42 AM

## 2021-02-01 NOTE — Progress Notes (Signed)
Peripherally Inserted Central Catheter Placement  The IV Nurse has discussed with the patient and/or persons authorized to consent for the patient, the purpose of this procedure and the potential benefits and risks involved with this procedure.  The benefits include less needle sticks, lab draws from the catheter, and the patient may be discharged home with the catheter. Risks include, but not limited to, infection, bleeding, blood clot (thrombus formation), and puncture of an artery; nerve damage and irregular heartbeat and possibility to perform a PICC exchange if needed/ordered by physician.  Alternatives to this procedure were also discussed.  Bard Power PICC patient education guide, fact sheet on infection prevention and patient information card has been provided to patient /or left at bedside.    PICC Placement Documentation  PICC Single Lumen 02/01/21 Right Brachial 35 cm 0 cm (Active)  Indication for Insertion or Continuance of Line Prolonged intravenous therapies 02/01/21 1330  Exposed Catheter (cm) 0 cm 02/01/21 1330  Site Assessment Clean;Dry;Intact 02/01/21 1330  Line Status Flushed;Saline locked;Blood return noted 02/01/21 1330  Dressing Type Transparent;Securing device 02/01/21 1330  Dressing Status Clean;Dry;Intact 02/01/21 1330  Antimicrobial disc in place? Yes 02/01/21 1330  Dressing Intervention New dressing;Other (Comment) 02/01/21 1330       Enos Fling 02/01/2021, 1:31 PM

## 2021-02-01 NOTE — Plan of Care (Signed)
Pt alert and oriented, reported pain to pelvic area during am shift, picc placed and prn pain medication provided, urine retention noted, indwelling foley placed via md order, pt to receive inpatient iv abx therapy, constipation noted, medication changes to assist with bowel movement provided, call bell within reach, bed in lowest and locked position, sbar to be provided to on coming nurse.   Problem: Clinical Measurements: Goal: Ability to maintain clinical measurements within normal limits will improve Outcome: Progressing Goal: Will remain free from infection Outcome: Progressing   Problem: Nutrition: Goal: Adequate nutrition will be maintained Outcome: Progressing   Problem: Pain Managment: Goal: General experience of comfort will improve Outcome: Progressing

## 2021-02-01 NOTE — Progress Notes (Signed)
Md notified of patient c/o not able to urinate, noted in and out cath twice with removal of over 7100m of urine, received verbal order to place indwelling foley, pt c/o pain, prn iv pulled to administer, unable to administer due to iv site occluded, iv prn not administered and returned to pyxis, iv team notified.

## 2021-02-02 DIAGNOSIS — E871 Hypo-osmolality and hyponatremia: Secondary | ICD-10-CM | POA: Diagnosis not present

## 2021-02-02 DIAGNOSIS — B9562 Methicillin resistant Staphylococcus aureus infection as the cause of diseases classified elsewhere: Secondary | ICD-10-CM | POA: Diagnosis not present

## 2021-02-02 DIAGNOSIS — R7881 Bacteremia: Secondary | ICD-10-CM | POA: Diagnosis not present

## 2021-02-02 DIAGNOSIS — K5900 Constipation, unspecified: Secondary | ICD-10-CM | POA: Diagnosis not present

## 2021-02-02 DIAGNOSIS — M86159 Other acute osteomyelitis, unspecified femur: Secondary | ICD-10-CM | POA: Diagnosis not present

## 2021-02-02 LAB — BASIC METABOLIC PANEL
Anion gap: 7 (ref 5–15)
BUN: 12 mg/dL (ref 6–20)
CO2: 30 mmol/L (ref 22–32)
Calcium: 8.9 mg/dL (ref 8.9–10.3)
Chloride: 93 mmol/L — ABNORMAL LOW (ref 98–111)
Creatinine, Ser: 0.53 mg/dL (ref 0.44–1.00)
GFR, Estimated: >60 mL/min (ref >60)
Glucose, Bld: 93 mg/dL (ref 70–99)
Potassium: 4.9 mmol/L (ref 3.5–5.1)
Sodium: 130 mmol/L — ABNORMAL LOW (ref 135–145)

## 2021-02-02 MED ORDER — SODIUM ZIRCONIUM CYCLOSILICATE 10 G PO PACK
10.0000 g | PACK | Freq: Every day | ORAL | Status: DC
  Administered 2021-02-03 – 2021-02-12 (×8): 10 g via ORAL
  Filled 2021-02-02 (×10): qty 1

## 2021-02-02 MED ORDER — SORBITOL 70 % SOLN
960.0000 mL | TOPICAL_OIL | Freq: Once | ORAL | Status: AC
  Administered 2021-02-02: 960 mL via RECTAL
  Filled 2021-02-02: qty 473

## 2021-02-02 MED ORDER — POLYETHYLENE GLYCOL 3350 17 G PO PACK
17.0000 g | PACK | Freq: Two times a day (BID) | ORAL | Status: DC
  Administered 2021-02-02 – 2021-02-03 (×3): 17 g via ORAL
  Filled 2021-02-02 (×3): qty 1

## 2021-02-02 MED ORDER — NALOXEGOL OXALATE 12.5 MG PO TABS
12.5000 mg | ORAL_TABLET | Freq: Once | ORAL | Status: AC
  Administered 2021-02-02: 12.5 mg via ORAL
  Filled 2021-02-02 (×2): qty 1

## 2021-02-02 MED ORDER — NALOXEGOL OXALATE 25 MG PO TABS
25.0000 mg | ORAL_TABLET | Freq: Every day | ORAL | Status: DC
  Administered 2021-02-03 – 2021-02-19 (×8): 25 mg via ORAL
  Filled 2021-02-02 (×20): qty 1

## 2021-02-02 MED ORDER — SENNOSIDES-DOCUSATE SODIUM 8.6-50 MG PO TABS
2.0000 | ORAL_TABLET | Freq: Two times a day (BID) | ORAL | Status: DC
  Administered 2021-02-02 – 2021-02-17 (×13): 2 via ORAL
  Filled 2021-02-02 (×21): qty 2

## 2021-02-02 NOTE — Progress Notes (Signed)
[place] for Infectious Disease  Date of Admission:  01/19/2021     Total days of antibiotics 14         ASSESSMENT:  Ms. Kelly [name] cultures have finalized without growth to date. She continues to have pelvic and back pain which appears to be exacerbated by constipation. Discussed plan of care to include 6 weeks of antibiotics followed by oral therapy. Will need additional sensitivity testing as she has a multidrug resistant MRSA with resistance to tetracylines and bactrim (susceptible to clindamycin). Will check Zyvox or possibly omadacyline. Continue current dose of daptomycin using 01/26/21 as start date putting end date of 03/09/21. Will need to remain the hospital for IV treatment secondary to recent drug use. ID will follow intermittently as needed.  PLAN:  Continue daptomycin Monitor CK levels weekly per protocol. PICC care per protocol.  End date of treatment estimated at 03/09/21 Remaining medical care per primary team. ID will follow intermittently.   Principal Problem:   MRSA bacteremia Active Problems:   IVDU (intravenous drug user)   Back pain   Polysubstance abuse (HCC)   Hyponatremia   Endocarditis of tricuspid valve   Severe tricuspid regurgitation   Prolonged QT interval   Sepsis (HCC)   Constipation   Pelvic fluid collection   Acute osteomyelitis of pelvic region (HCC)    Chlorhexidine Gluconate Cloth  6 each Topical Q0600   enoxaparin (LOVENOX) injection  40 mg Subcutaneous Q24H   methadone  100 mg Oral Daily   naloxegol oxalate  12.5 mg Oral Once   [START ON 02/03/2021] naloxegol oxalate  25 mg Oral QAC breakfast   polyethylene glycol  17 g Oral BID   senna-docusate  2 tablet Oral BID   sodium chloride flush  10-40 mL Intracatheter Q12H   [START ON 02/03/2021] sodium zirconium cyclosilicate  10 g Oral Daily   tamsulosin  0.4 mg Oral Daily    SUBJECTIVE:  Afebrile overnight. Having issues with constipation and increased pain.  Allergies   Allergen Reactions   Hydrocodone Itching   Morphine And Related Nausea And Vomiting     Review of Systems: Review of Systems  Constitutional:  Negative for chills, fever and weight loss.  Respiratory:  Negative for cough, shortness of breath and wheezing.   Cardiovascular:  Negative for chest pain and leg swelling.  Gastrointestinal:  Positive for constipation. Negative for abdominal pain, diarrhea, nausea and vomiting.  Musculoskeletal:        Positive for pelvic pain  Skin:  Negative for rash.     OBJECTIVE: Vitals:   02/02/21 0003 02/02/21 0335 02/02/21 0805 02/02/21 1157  BP: 97/66 99/66 111/79 96/64  Pulse: 79 88 92 90  Resp: 18  17 18   Temp: 98.7 F (37.1 C) 98.7 F (37.1 C) 98.8 F (37.1 C) 98 F (36.7 C)  TempSrc: Oral Oral Oral Oral  SpO2: 91% 90% 91% 91%  Weight:      Height:       Body mass index is 28.31 kg/m.  Physical Exam Constitutional:      General: She is not in acute distress.    Appearance: She is well-developed.     Comments: Lying in bed with head of bed elevated; appears uncomfortable.   Cardiovascular:     Rate and Rhythm: Normal rate and regular rhythm.     Heart sounds: Normal heart sounds.     Comments: PICC line right upper extremity.  Pulmonary:     Effort: Pulmonary effort  is normal.     Breath sounds: Normal breath sounds.  Skin:    General: Skin is warm and dry.  Neurological:     Mental Status: She is alert and oriented to person, place, and time.  Psychiatric:        Mood and Affect: Mood normal.    Lab Results Lab Results  Component Value Date   WBC 10.4 02/01/2021   HGB 7.6 (L) 02/01/2021   HCT 24.8 (L) 02/01/2021   MCV 81.8 02/01/2021   PLT 626 (H) 02/01/2021    Lab Results  Component Value Date   CREATININE 0.53 02/02/2021   BUN 12 02/02/2021   NA 130 (L) 02/02/2021   K 4.9 02/02/2021   CL 93 (L) 02/02/2021   CO2 30 02/02/2021    Lab Results  Component Value Date   ALT <5 01/26/2021   AST 10 (L)  01/26/2021   ALKPHOS 103 01/26/2021   BILITOT 0.4 01/26/2021     Microbiology: Recent Results (from the past 240 hour(s))  Culture, blood (Routine X 2) w Reflex to ID Panel     Status: Abnormal   Collection Time: 01/24/21  6:54 AM   Specimen: BLOOD  Result Value Ref Range Status   Specimen Description BLOOD RIGHT ANTECUBITAL  Final   Special Requests   Final    AEROBIC BOTTLE ONLY Blood Culture results may not be optimal due to an inadequate volume of blood received in culture bottles   Culture  Setup Time   Final    GRAM POSITIVE COCCI AEROBIC BOTTLE ONLY CRITICAL VALUE NOTED.  VALUE IS CONSISTENT WITH PREVIOUSLY REPORTED AND CALLED VALUE.    Culture (A)  Final    STAPHYLOCOCCUS AUREUS SEE SEPARATE REPORT Performed at [facility], [place] [address], [place], [place] [address]    Report Status 01/31/2021 FINAL  Final  Culture, blood (routine x 2)     Status: None   Collection Time: 01/26/21  5:06 AM   Specimen: BLOOD RIGHT HAND  Result Value Ref Range Status   Specimen Description BLOOD RIGHT HAND  Final   Special Requests AEROBIC BOTTLE ONLY Blood Culture adequate volume  Final   Culture   Final    NO GROWTH 5 DAYS Performed at [facility], [place] [address] [place], [place] [address]    Report Status 01/31/2021 FINAL  Final  Culture, blood (routine x 2)     Status: None   Collection Time: 01/26/21  5:06 AM   Specimen: BLOOD LEFT HAND  Result Value Ref Range Status   Specimen Description BLOOD LEFT HAND  Final   Special Requests AEROBIC BOTTLE ONLY Blood Culture adequate volume  Final   Culture   Final    NO GROWTH 5 DAYS Performed at [facility], [place] [address], [place], [place] [address]    Report Status 01/31/2021 FINAL  Final  Aerobic/Anaerobic Culture w Gram Stain (surgical/deep wound)     Status: None   Collection Time: 01/26/21 10:58 AM   Specimen: Abscess  Result Value Ref Range Status   Specimen Description ABSCESS ASPIRATE  Final    Special Requests SYMPHYSIS PUBIS  Final   Gram Stain   Final    ABUNDANT WBC PRESENT, PREDOMINANTLY PMN FEW GRAM POSITIVE COCCI IN CLUSTERS    Culture   Final    FEW METHICILLIN RESISTANT STAPHYLOCOCCUS AUREUS NO ANAEROBES ISOLATED Performed at [facility], [place] [address] [place], [place] [address]  Report Status 01/31/2021 FINAL  Final   Organism ID, Bacteria METHICILLIN RESISTANT STAPHYLOCOCCUS AUREUS  Final      Susceptibility   Methicillin resistant staphylococcus aureus - MIC*    CIPROFLOXACIN >=8 RESISTANT Resistant     ERYTHROMYCIN >=8 RESISTANT Resistant     GENTAMICIN <=0.5 SENSITIVE Sensitive     OXACILLIN >=4 RESISTANT Resistant     TETRACYCLINE >=16 RESISTANT Resistant     VANCOMYCIN <=0.5 SENSITIVE Sensitive     TRIMETH/SULFA >=320 RESISTANT Resistant     CLINDAMYCIN <=0.25 SENSITIVE Sensitive     RIFAMPIN <=0.5 SENSITIVE Sensitive     Inducible Clindamycin NEGATIVE Sensitive     * FEW METHICILLIN RESISTANT STAPHYLOCOCCUS AUREUS     Terri Piedra, NP [facility]  02/02/2021  1:53 PM

## 2021-02-02 NOTE — Progress Notes (Signed)
HOSPITAL MEDICINE OVERNIGHT EVENT NOTE    Notified by nursing that patient has been complaining of progressively worsening constipation over the past several days.  Constipation is likely opiate induced as the patient is on extremely high amounts of both scheduled and as needed opiate-based analgesics.  Chart reviewed, patient is already on an excellent daily bowel regimen including scheduled senna, MiraLAX and Movantik.    According to nursing patient denies nausea or vomiting.  Patient is able to tolerate oral intake.  Abdomen is soft and nontender .  Therefore, bowel obstruction is unlikely.  Patient is requesting an enema and therefore we will order a one-time smog enema, monitor for symptomatic improvement.  Vernelle Emerald  MD [facility]

## 2021-02-02 NOTE — Progress Notes (Signed)
PROGRESS NOTE    Kelly [name]  ZOX:[id number] DOB: 08-04-1985 DOA: 01/19/2021 PCP: Pcp, No   Chief Complaint  Patient presents with   Abdominal Pain    Brief Narrative:  Kelly [name] is a 35 y.o. 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  Subjective: Complains of pelvic pain-continues to have some hard stools.  No vomiting.  Objective: Vitals: Blood pressure 111/79, pulse 92, temperature 98.8 F (37.1 C), temperature source Oral, resp. rate 17, height 5' 7"  (1.702 m), weight 82 kg, SpO2 91 %, unknown if currently breastfeeding.   Exam: Gen Exam:Alert awake-not in any distress HEENT:atraumatic, normocephalic Chest: B/L clear to auscultation anteriorly CVS:S1S2 regular Abdomen:soft non tender, non distended Extremities:no edema Neurology: Non focal Skin: no rash   Pertinent labs/x-rays K: 4.9  Mircobiology: Blood culture on 9/8, 9/12 and 9/14: MRSA Blood culutre on 9/15: neg Pelvic abscess cx 9/15: MRSA  MRI pelvis on 9/13: Septic arthritis of pubic symphysis/osteomyelitis and surrounding abscesses Echo on 9/8: EF 81-19%, RV systolic function reduced.  RVSP 68.6 mmHg.  Severe TR.  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  Per ID-no benefit to repeating a echo/TEE-as it would not change management (patient not a candidate for surgery).  ID recommending daptomycin 6 weeks from 9/15.  Bacterial vaginosis:  Completed a course of oral Flagyl  Hyponatremia: Euvolemic-mild hyponatremia persists-maintain fluid restriction.  Hyperkalemia: Mild-unclear etiology-we will maintain on Lokelma for a few days.  Normocytic anemia: Slight drop in hemoglobin-I suspect this is due to acute illness-she has no evidence of blood loss.  Transfuse if hemoglobin less than 7.  Acute urinary retention: Failed voiding trial again on 9/20-Foley replaced on 9/21.  We will attempt voiding trial in 2 weeks (since this is his second failure).  Continue Flomax.  Prolonged QTC: Resolved-stable QTC on 9/9 and 9/18.    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 difficile infection in the past  Constipation: Likely due to narcotics-continues to have hard stools-change MiraLAX to twice daily-change senna to 2 tablets nightly-increased dose of Movantik.  Received smog enema earlier with some success.  Reassess on 9/23.  History of intravenous drug use (UDS on 9/9 positive for benzos, opiates, amphetamine and cocaine): Continues to have some amount of pelvic pain-increase methadone to 100 mg today (claims to be 120 mg prior to this hospitalization).   DVT prophylaxis: Lovenox Code Status: Full Family Communication: None at bedside Disposition:   Status is: Inpatient  Remains inpatient appropriate because:IV treatments appropriate due to intensity of illness or inability to take PO  Dispo: The patient is from: Home              Anticipated d/c is to: Home              Patient currently is not medically stable to d/c.   Difficult to place patient Yes     Consultants:  ID Ortho IR   Data Reviewed: I have personally reviewed following labs and imaging studies  CBC: Recent Labs  Lab 01/27/21 0521 01/29/21 0101 02/01/21 0549  WBC 14.3* 12.3* 10.4  HGB 9.0* 8.3* 7.6*  HCT 29.8* 27.7* 24.8*  MCV 81.9 81.0 81.8  PLT 480* 620* 626*     Basic  Metabolic Panel: Recent Labs  Lab 01/29/21 0101 01/30/21 0824 01/30/21 1607 01/31/21 0611 01/31/21 0755 02/01/21 0442 02/02/21 0334  NA 129* 131* 133*  --  131* 133* 130*  K 4.8 6.2* 4.9  --  5.3* 5.0 4.9  CL 95* 98 97*  --  97* 97* 93*  CO2 23 22 25   --  24 24 30   GLUCOSE 96 76 101*  --  89 126* 93  BUN 11 16 15   --  13 13 12   CREATININE 0.53 0.49 0.55  --  0.55 0.61 0.53  CALCIUM 8.4* 8.3* 8.4*  --  8.8* 8.8* 8.9  MG 1.6* 2.0  --  1.7  --  2.0  --      GFR: Estimated Creatinine Clearance: 107.1 mL/min (by C-G formula based on SCr of 0.53 mg/dL).  Liver Function Tests: No results for input(s): AST, ALT, ALKPHOS, BILITOT, PROT, ALBUMIN in the last 168 hours.   CBG: No results for input(s): GLUCAP in the last 168 hours.   Recent Results (from the past 240 hour(s))  Culture, blood (Routine X 2) w Reflex to ID Panel     Status: Abnormal   Collection Time: 01/23/21  1:00 PM   Specimen: BLOOD RIGHT HAND  Result Value Ref Range Status   Specimen Description BLOOD RIGHT HAND  Final   Special Requests IN PEDIATRIC BOTTLE Blood Culture adequate volume  Final   Culture  Setup Time   Final    GRAM POSITIVE COCCI AEROBIC BOTTLE ONLY CRITICAL VALUE NOTED.  VALUE IS CONSISTENT WITH PREVIOUSLY REPORTED AND CALLED VALUE.    Culture (A)  Final    STAPHYLOCOCCUS AUREUS SUSCEPTIBILITIES PERFORMED ON PREVIOUS CULTURE WITHIN THE LAST 5 DAYS. Performed at [facility], [place] [address] [place], [place] [address]    Report Status 01/25/2021 FINAL  Final  Culture, blood (Routine X 2) w Reflex to ID Panel     Status: Abnormal   Collection Time: 01/24/21  6:54 AM   Specimen: BLOOD  Result Value Ref Range Status   Specimen Description BLOOD RIGHT ANTECUBITAL  Final   Special Requests   Final    AEROBIC BOTTLE ONLY Blood Culture results may not be optimal due to an inadequate volume of blood received in culture bottles   Culture  Setup Time   Final    GRAM POSITIVE COCCI AEROBIC  BOTTLE ONLY CRITICAL VALUE NOTED.  VALUE IS CONSISTENT WITH PREVIOUSLY REPORTED AND CALLED VALUE.    Culture (A)  Final    STAPHYLOCOCCUS AUREUS SEE SEPARATE REPORT Performed at [facility], [place] [address] [place], [place] [address]    Report Status 01/31/2021 FINAL  Final  Culture, blood (routine x 2)     Status: None   Collection Time: 01/26/21  5:06 AM   Specimen: BLOOD RIGHT HAND  Result Value Ref Range Status   Specimen Description BLOOD RIGHT HAND  Final   Special Requests AEROBIC BOTTLE ONLY Blood Culture adequate volume  Final   Culture   Final    NO GROWTH 5 DAYS Performed at [facility], [place] [address] [place], [place] [address]    Report Status 01/31/2021 FINAL  Final  Culture, blood (routine x 2)  Status: None   Collection Time: 01/26/21  5:06 AM   Specimen: BLOOD LEFT HAND  Result Value Ref Range Status   Specimen Description BLOOD LEFT HAND  Final   Special Requests AEROBIC BOTTLE ONLY Blood Culture adequate volume  Final   Culture   Final    NO GROWTH 5 DAYS Performed at [facility], [address], [place], [place] [address]    Report Status 01/31/2021 FINAL  Final  Aerobic/Anaerobic Culture w Gram Stain (surgical/deep wound)     Status: None   Collection Time: 01/26/21 10:58 AM   Specimen: Abscess  Result Value Ref Range Status   Specimen Description ABSCESS ASPIRATE  Final   Special Requests SYMPHYSIS PUBIS  Final   Gram Stain   Final    ABUNDANT WBC PRESENT, PREDOMINANTLY PMN FEW GRAM POSITIVE COCCI IN CLUSTERS    Culture   Final    FEW METHICILLIN RESISTANT STAPHYLOCOCCUS AUREUS NO ANAEROBES ISOLATED Performed at [facility], [place] [address], [place], [place] [address]    Report Status 01/31/2021 FINAL  Final   Organism ID, Bacteria METHICILLIN RESISTANT STAPHYLOCOCCUS AUREUS  Final      Susceptibility   Methicillin resistant staphylococcus aureus - MIC*    CIPROFLOXACIN >=8 RESISTANT Resistant     ERYTHROMYCIN >=8  RESISTANT Resistant     GENTAMICIN <=0.5 SENSITIVE Sensitive     OXACILLIN >=4 RESISTANT Resistant     TETRACYCLINE >=16 RESISTANT Resistant     VANCOMYCIN <=0.5 SENSITIVE Sensitive     TRIMETH/SULFA >=320 RESISTANT Resistant     CLINDAMYCIN <=0.25 SENSITIVE Sensitive     RIFAMPIN <=0.5 SENSITIVE Sensitive     Inducible Clindamycin NEGATIVE Sensitive     * FEW METHICILLIN RESISTANT STAPHYLOCOCCUS AUREUS          Radiology Studies: [place] EKG SITE RITE  Result Date: 02/01/2021 If Site Rite image not attached, placement could not be confirmed due to current cardiac rhythm.       Scheduled Meds:  Chlorhexidine Gluconate Cloth  6 each Topical Q0600   enoxaparin (LOVENOX) injection  40 mg Subcutaneous Q24H   methadone  100 mg Oral Daily   naloxegol oxalate  12.5 mg Oral Once   [START ON 02/03/2021] naloxegol oxalate  25 mg Oral QAC breakfast   polyethylene glycol  17 g Oral BID   senna-docusate  2 tablet Oral BID   sodium chloride flush  10-40 mL Intracatheter Q12H   [START ON 02/03/2021] sodium zirconium cyclosilicate  10 g Oral Daily   tamsulosin  0.4 mg Oral Daily   Continuous Infusions:  sodium chloride Stopped (01/28/21 0504)   DAPTOmycin (CUBICIN)  IV 650 mg (02/01/21 2113)     LOS: 14 days        Oren Binet, MD [facility]   To contact the attending provider between 7A-7P or the covering provider during after hours 7P-7A, please log into the web site [email or web address] and access using universal [place] password for that web site. If you do not have the password, please call the hospital operator.  02/02/2021, 11:52 AM

## 2021-02-02 NOTE — Progress Notes (Signed)
Physical Therapy Treatment Patient Details Name: Kelly [name] MRN: [id number] DOB: 1985/09/15 Today's Date: 02/02/2021   History of Present Illness 35 y.o. female presents to [name] hospital on 01/19/2021 with pelvic pain. Pt found to have sepsis 2/2 MRSA bacteremia. +pelvic abscesses with IR drainage 9/15; septic arthritis of pubic symphysis   PMH includes HTN, systolic CHF with moderate pulmonary hypertension and severe TR, polysubstance abuse including history of IV drug use, recurrent history of endocarditis, C. difficile colitis.    PT Comments    Pt still very painful but willing to participate with therapy.  Received medication prior to therapy.  Reports pain somewhat worse the past 2 day and hurts most in groin when advancing legs. Continue to progress as able.     Recommendations for follow up therapy are one component of a multi-disciplinary discharge planning process, led by the attending physician.  Recommendations may be updated based on patient status, additional functional criteria and insurance authorization.  Follow Up Recommendations  Home health PT     Equipment Recommendations  Wheelchair (measurements PT)    Recommendations for Other Services       Precautions / Restrictions Precautions Precautions: Fall     Mobility  Bed Mobility Overal bed mobility: Needs Assistance Bed Mobility: Supine to Sit;Sit to Supine     Supine to sit: Min assist Sit to supine: Min assist   General bed mobility comments: Use of rails with therapist assisting with legs (keeping them together for decreased pain)    Transfers Overall transfer level: Needs assistance Equipment used: Rolling walker (2 wheeled) Transfers: Sit to/from Stand Sit to Stand: Min assist;From elevated surface         General transfer comment: Increased time to rise  Ambulation/Gait Ambulation/Gait assistance: Min guard Gait Distance (Feet): 25 Feet Assistive device: Rolling walker (2  wheeled) Gait Pattern/deviations: Step-to pattern;Decreased stride length Gait velocity: very slow   General Gait Details: Slow with reliance on RW for pain control; very painful to advance legs using hip flexors - tried with increased trunk rotation but also painful   Stairs             Wheelchair Mobility    Modified Rankin (Stroke Patients Only)       Balance Overall balance assessment: Needs assistance Sitting-balance support: Feet supported;Bilateral upper extremity supported Sitting balance-Leahy Scale: Fair Sitting balance - Comments: UE support for pain control   Standing balance support: Bilateral upper extremity supported Standing balance-Leahy Scale: Fair Standing balance comment: UE support for pain control                            Cognition Arousal/Alertness: Awake/alert Behavior During Therapy: WFL for tasks assessed/performed Overall Cognitive Status: Within Functional Limits for tasks assessed                                 General Comments: Pt polite , agreeable to therapy, and trying to move and participate despite pain      Exercises General Exercises - Lower Extremity Ankle Circles/Pumps: AAROM;Both;10 reps;Supine Quad Sets: AROM;Both;5 reps;Supine Short Arc Quad: AROM;Both;5 reps;Supine Heel Slides: AAROM;Both;5 reps;Supine (Self AAROM for pain control)    General Comments General comments (skin integrity, edema, etc.): VSS      Pertinent Vitals/Pain Pain Assessment: 0-10 Pain Score: 10-Worst pain ever (with walking) Pain Location: groin when advancing leg Pain Descriptors / Indicators:  Moaning;Grimacing;Guarding Pain Intervention(s): Limited activity within patient's tolerance;Monitored during session;Premedicated before session;Repositioned    Home Living                      Prior Function            PT Goals (current goals can now be found in the care plan section) Progress towards PT  goals: Progressing toward goals    Frequency    Min 3X/week      PT Plan Current plan remains appropriate    Co-evaluation              AM-PAC PT "6 Clicks" Mobility   Outcome Measure  Help needed turning from your back to your side while in a flat bed without using bedrails?: A Little Help needed moving from lying on your back to sitting on the side of a flat bed without using bedrails?: A Little Help needed moving to and from a bed to a chair (including a wheelchair)?: A Little Help needed standing up from a chair using your arms (e.g., wheelchair or bedside chair)?: A Little Help needed to walk in hospital room?: A Little Help needed climbing 3-5 steps with a railing? : A Lot 6 Click Score: 17    End of Session   Activity Tolerance: Patient limited by pain Patient left: with call bell/phone within reach;in bed Nurse Communication: Mobility status PT Visit Diagnosis: Unsteadiness on feet (R26.81);Other abnormalities of gait and mobility (R26.89);Muscle weakness (generalized) (M62.81);Pain     Time: 1421-1449 PT Time Calculation (min) (ACUTE ONLY): 28 min  Charges:  $Gait Training: 8-22 mins $Therapeutic Activity: 8-22 mins                     Abran Richard, PT Acute Rehab Services Pager [phone] [facility] [place] 02/02/2021, 3:02 PM

## 2021-02-03 DIAGNOSIS — R7881 Bacteremia: Secondary | ICD-10-CM | POA: Diagnosis not present

## 2021-02-03 DIAGNOSIS — K5900 Constipation, unspecified: Secondary | ICD-10-CM | POA: Diagnosis not present

## 2021-02-03 DIAGNOSIS — E871 Hypo-osmolality and hyponatremia: Secondary | ICD-10-CM | POA: Diagnosis not present

## 2021-02-03 DIAGNOSIS — M86159 Other acute osteomyelitis, unspecified femur: Secondary | ICD-10-CM | POA: Diagnosis not present

## 2021-02-03 LAB — C-REACTIVE PROTEIN: CRP: 7 mg/dL — ABNORMAL HIGH (ref <1.0)

## 2021-02-03 MED ORDER — POLYETHYLENE GLYCOL 3350 17 G PO PACK
17.0000 g | PACK | Freq: Three times a day (TID) | ORAL | Status: DC
  Administered 2021-02-03 – 2021-02-05 (×8): 17 g via ORAL
  Filled 2021-02-03 (×13): qty 1

## 2021-02-03 MED ORDER — FLEET ENEMA 7-19 GM/118ML RE ENEM: 1.0000 | ENEMA | Freq: Once | RECTAL | Status: DC

## 2021-02-03 NOTE — Progress Notes (Signed)
PROGRESS NOTE    Kelly [name]  MPN:[id number] DOB: December 31, 1985 DOA: 01/19/2021 PCP: Pcp, No   Chief Complaint  Patient presents with   Abdominal Pain    Brief Narrative:  Kelly [name] is a 35 y.o. 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  Subjective: Continues to complain of pain in the pelvic area-continues to complain of constipation apparently had some hard stools yesterday.  Objective: Vitals: Blood pressure 108/72, pulse 86, temperature 98.3 F (36.8 C), temperature source Oral, resp. rate 18, height 5' 7"  (1.702 m), weight 82 kg, SpO2 93 %, unknown if currently breastfeeding.   Exam: Gen Exam:Alert awake-not in any distress HEENT:atraumatic, normocephalic Chest: B/L clear to auscultation anteriorly CVS:S1S2 regular Abdomen:soft non tender, non distended Extremities:no edema Neurology: Non focal Skin: no rash   Pertinent labs/x-rays   Mircobiology: Blood culture on 9/8, 9/12 and 9/14: MRSA Blood culutre on 9/15: neg Pelvic abscess cx 9/15: MRSA  MRI pelvis on 9/13: Septic arthritis of pubic symphysis/osteomyelitis and surrounding abscesses Echo on 9/8: EF 00-86%, RV systolic function reduced.  RVSP 68.6 mmHg.  Severe TR.  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  Per ID-no benefit to repeating a echo/TEE-as it would not change management (patient not a candidate for surgery).  ID recommending  daptomycin 6 weeks from 9/15.  Bacterial vaginosis: Completed a course of oral Flagyl  Hyponatremia: Euvolemic-mild hyponatremia persists-maintain fluid restriction.  Hyperkalemia: Mild-unclear etiology-we will maintain on Lokelma for a few days.  Normocytic anemia: Slight drop in hemoglobin-I suspect this is due to acute illness-she has no evidence of blood loss.  Transfuse if hemoglobin less than 7.  Acute urinary retention: Failed voiding trial again on 9/20-Foley replaced on 9/21.  We will attempt voiding trial in 2 weeks (since this is his second failure).  Continue Flomax.  Prolonged QTC: Resolved-stable QTC on 9/9 and 9/18.    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 difficile infection in the past  Constipation: Likely due to narcotics-continues to have hard stool in spite of being on scheduled MiraLAX/senna and Movantik-received enema on 9/22 with some success-repeat enema today.   History of intravenous drug use (UDS on 9/9 positive for benzos, opiates, amphetamine and cocaine): Continues to have some amount of pelvic pain-increase methadone to 100 mg today (claims to be 120 mg prior to this hospitalization).   DVT prophylaxis: Lovenox Code Status: Full Family Communication: None at bedside Disposition:   Status is: Inpatient  Remains inpatient appropriate because:IV treatments appropriate due to intensity of illness or inability to take PO  Dispo: The patient is from: Home              Anticipated d/c is to: Home              Patient currently is not medically stable to d/c.   Difficult to place patient Yes     Consultants:  ID Ortho IR   Data Reviewed: I have personally reviewed following labs and imaging studies  CBC: Recent Labs  Lab 01/29/21 0101 02/01/21 0549  WBC 12.3* 10.4  HGB 8.3* 7.6*  HCT 27.7* 24.8*  MCV 81.0 81.8  PLT 620* 626*     Basic Metabolic Panel: Recent Labs   Lab 01/29/21 0101 01/30/21 0824 01/30/21 1607 01/31/21 0611 01/31/21 0755 02/01/21 0442 02/02/21 0334  NA 129* 131* 133*  --  131* 133* 130*  K 4.8 6.2* 4.9  --  5.3* 5.0 4.9  CL 95* 98 97*  --  97* 97* 93*  CO2 23 22 25   --  24 24 30   GLUCOSE 96 76 101*  --  89 126* 93  BUN 11 16 15   --  13 13 12   CREATININE 0.53 0.49 0.55  --  0.55 0.61 0.53  CALCIUM 8.4* 8.3* 8.4*  --  8.8* 8.8* 8.9  MG 1.6* 2.0  --  1.7  --  2.0  --      GFR: Estimated Creatinine Clearance: 107.1 mL/min (by C-G formula based on SCr of 0.53 mg/dL).  Liver Function Tests: No results for input(s): AST, ALT, ALKPHOS, BILITOT, PROT, ALBUMIN in the last 168 hours.   CBG: No results for input(s): GLUCAP in the last 168 hours.   Recent Results (from the past 240 hour(s))  Culture, blood (routine x 2)     Status: None   Collection Time: 01/26/21  5:06 AM   Specimen: BLOOD RIGHT HAND  Result Value Ref Range Status   Specimen Description BLOOD RIGHT HAND  Final   Special Requests AEROBIC BOTTLE ONLY Blood Culture adequate volume  Final   Culture   Final    NO GROWTH 5 DAYS Performed at [facility], [address] [place], [place] [address]    Report Status 01/31/2021 FINAL  Final  Culture, blood (routine x 2)     Status: None   Collection Time: 01/26/21  5:06 AM   Specimen: BLOOD LEFT HAND  Result Value Ref Range Status   Specimen Description BLOOD LEFT HAND  Final   Special Requests AEROBIC BOTTLE ONLY Blood Culture adequate volume  Final   Culture   Final    NO GROWTH 5 DAYS Performed at [facility], [place] [address] [place], [place] [address]    Report Status 01/31/2021 FINAL  Final  Aerobic/Anaerobic Culture w Gram Stain (surgical/deep wound)     Status: None   Collection Time: 01/26/21 10:58 AM   Specimen: Abscess  Result Value Ref Range Status   Specimen Description ABSCESS ASPIRATE  Final   Special Requests SYMPHYSIS PUBIS  Final   Gram Stain   Final    ABUNDANT WBC  PRESENT, PREDOMINANTLY PMN FEW GRAM POSITIVE COCCI IN CLUSTERS    Culture   Final    FEW METHICILLIN RESISTANT STAPHYLOCOCCUS AUREUS NO ANAEROBES ISOLATED Performed at [facility], [place] [address] [place], [place] [address]    Report Status 01/31/2021 FINAL  Final   Organism ID, Bacteria METHICILLIN RESISTANT STAPHYLOCOCCUS AUREUS  Final      Susceptibility   Methicillin resistant staphylococcus aureus - MIC*    CIPROFLOXACIN >=8 RESISTANT Resistant     ERYTHROMYCIN >=8 RESISTANT Resistant     GENTAMICIN <=0.5 SENSITIVE Sensitive     OXACILLIN >=4 RESISTANT Resistant     TETRACYCLINE >=16 RESISTANT Resistant     VANCOMYCIN <=0.5 SENSITIVE Sensitive     TRIMETH/SULFA >=320 RESISTANT Resistant     CLINDAMYCIN <=0.25 SENSITIVE Sensitive     RIFAMPIN <=0.5 SENSITIVE  Sensitive     Inducible Clindamycin NEGATIVE Sensitive     * FEW METHICILLIN RESISTANT STAPHYLOCOCCUS AUREUS          Radiology Studies: No results found.      Scheduled Meds:  Chlorhexidine Gluconate Cloth  6 each Topical Q0600   enoxaparin (LOVENOX) injection  40 mg Subcutaneous Q24H   methadone  100 mg Oral Daily   naloxegol oxalate  25 mg Oral QAC breakfast   polyethylene glycol  17 g Oral BID   senna-docusate  2 tablet Oral BID   sodium chloride flush  10-40 mL Intracatheter Q12H   sodium phosphate  1 enema Rectal Once   sodium zirconium cyclosilicate  10 g Oral Daily   tamsulosin  0.4 mg Oral Daily   Continuous Infusions:  sodium chloride Stopped (01/28/21 0504)   DAPTOmycin (CUBICIN)  IV 650 mg (02/02/21 2106)     LOS: 15 days        Oren Binet, MD [facility]   To contact the attending provider between 7A-7P or the covering provider during after hours 7P-7A, please log into the web site [email or web address] and access using universal  password for that web site. If you do not have the password, please call the hospital operator.  02/03/2021, 11:47 AM

## 2021-02-03 NOTE — Progress Notes (Signed)
PT Cancellation Note  Patient Details Name: SISSY GOETZKE MRN: [id number] DOB: 06/23/1985   Cancelled Treatment:    Reason Eval/Treat Not Completed: Patient declined, no reason specified. Pt premedicated prior to therapy attempt. Pt stating "I just need to get some sleep." Encouragement provided but pt continued to decline.   Lorriane Shire 02/03/2021, 10:48 AM  Lorrin Goodell, PT  Office # [phone] Pager [phone]

## 2021-02-04 DIAGNOSIS — E871 Hypo-osmolality and hyponatremia: Secondary | ICD-10-CM | POA: Diagnosis not present

## 2021-02-04 DIAGNOSIS — M86159 Other acute osteomyelitis, unspecified femur: Secondary | ICD-10-CM | POA: Diagnosis not present

## 2021-02-04 DIAGNOSIS — R7881 Bacteremia: Secondary | ICD-10-CM | POA: Diagnosis not present

## 2021-02-04 DIAGNOSIS — K5900 Constipation, unspecified: Secondary | ICD-10-CM | POA: Diagnosis not present

## 2021-02-04 LAB — CBC
HCT: 25.7 % — ABNORMAL LOW (ref 36.0–46.0)
Hemoglobin: 7.6 g/dL — ABNORMAL LOW (ref 12.0–15.0)
MCH: 25.1 pg — ABNORMAL LOW (ref 26.0–34.0)
MCHC: 29.6 g/dL — ABNORMAL LOW (ref 30.0–36.0)
MCV: 84.8 fL (ref 80.0–100.0)
Platelets: 486 10*3/uL — ABNORMAL HIGH (ref 150–400)
RBC: 3.03 MIL/uL — ABNORMAL LOW (ref 3.87–5.11)
RDW: 20.2 % — ABNORMAL HIGH (ref 11.5–15.5)
WBC: 10.4 10*3/uL (ref 4.0–10.5)
nRBC: 0 % (ref 0.0–0.2)

## 2021-02-04 LAB — BASIC METABOLIC PANEL
Anion gap: 8 (ref 5–15)
BUN: 14 mg/dL (ref 6–20)
CO2: 30 mmol/L (ref 22–32)
Calcium: 9.1 mg/dL (ref 8.9–10.3)
Chloride: 93 mmol/L — ABNORMAL LOW (ref 98–111)
Creatinine, Ser: 0.56 mg/dL (ref 0.44–1.00)
GFR, Estimated: >60 mL/min (ref >60)
Glucose, Bld: 106 mg/dL — ABNORMAL HIGH (ref 70–99)
Potassium: 4.6 mmol/L (ref 3.5–5.1)
Sodium: 131 mmol/L — ABNORMAL LOW (ref 135–145)

## 2021-02-04 LAB — MAGNESIUM: Magnesium: 1.8 mg/dL (ref 1.7–2.4)

## 2021-02-04 MED ORDER — MAGNESIUM OXIDE -MG SUPPLEMENT 400 (240 MG) MG PO TABS
400.0000 mg | ORAL_TABLET | Freq: Every day | ORAL | Status: DC
  Administered 2021-02-05 – 2021-02-22 (×18): 400 mg via ORAL
  Filled 2021-02-04 (×18): qty 1

## 2021-02-04 MED ORDER — MAGNESIUM SULFATE 2 GM/50ML IV SOLN
2.0000 g | Freq: Once | INTRAVENOUS | Status: AC
  Administered 2021-02-04: 2 g via INTRAVENOUS
  Filled 2021-02-04: qty 50

## 2021-02-04 NOTE — Progress Notes (Signed)
PROGRESS NOTE    Kelly [name]  GGE:[id number] DOB: [birth year] DOA: 01/19/2021 PCP: Pcp, No   Chief Complaint  Patient presents with   Abdominal Pain    Brief Narrative:  Kelly [name] is a 35 y.o. 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  Subjective: Pelvic pressure/pain somewhat improved-had numerous BMs yesterday-stool still somewhat hard but better than the past few days.  Objective: Vitals: Blood pressure 97/76, pulse 94, temperature 97.9 F (36.6 C), temperature source Oral, resp. rate 17, height 5' 7"  (1.702 m), weight 82 kg, SpO2 91 %, unknown if currently breastfeeding.   Exam: Gen Exam:Alert awake-not in any distress HEENT:atraumatic, normocephalic Chest: B/L clear to auscultation anteriorly CVS:S1S2 regular Abdomen:soft non tender, non distended Extremities:no edema Neurology: Non focal Skin: no rash   Pertinent labs/x-rays Hb: 7.6 Mg: 1.8 K: 4.6  Mircobiology: Blood culture on 9/8, 9/12 and 9/14: MRSA Blood culutre on 9/15: neg Pelvic abscess cx 9/15: MRSA  MRI pelvis on 9/13: Septic arthritis of pubic symphysis/osteomyelitis and surrounding abscesses Echo on 9/8: EF 46-50%, RV systolic function reduced.  RVSP 68.6 mmHg.  Severe TR.  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  Per ID-no benefit to repeating a echo/TEE-as it would not change management (patient not a candidate for surgery).  ID  recommending daptomycin 6 weeks from 9/15 (PICC line in place)  Bacterial vaginosis: Completed a course of oral Flagyl  Hyponatremia: Euvolemic-mild hyponatremia persists-maintain fluid restriction.  Hyperkalemia: Unclear etiology-resolved-however on the higher normal side-but stable on Lokelma over the past few days.  Follow chemistries x3 days to ensure stability.  Hypomagnesemia: On oral magnesium supplementation-monitor electrolytes.  Normocytic anemia: No overt blood loss-anemia due to critical/acute illness-follow-and transfuse if <7   Acute urinary retention: Failed voiding trial again on 9/20-Foley replaced on 9/21.  We will attempt voiding trial in 2 weeks (since this is his second failure).  Continue Flomax.  Prolonged QTC: Resolved-stable QTC on 9/9 and 9/18.  Monitor EKG periodically.  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 difficile infection in the past  Constipation: Likely due to narcotics-improving-still with some hard stools-on scheduled MiraLAX/senna and Movantik.  Received numerous enemas over the past few days.  Continue to watch closely.  History of intravenous drug use (UDS on 9/9 positive for benzos, opiates, amphetamine and cocaine): Stable on methadone 100 mg daily (claims to be on 120 mg prior to this hospitalization) and IV Dilaudid for breakthrough pain.  On bowel regimen as noted above.  Patient aware that she will not be prescribed narcotics on discharge-and that she needs to contact her caseworker/outpatient methadone clinic for further narcotic needs.  She is agreeable with this plan.  She understands that when she gets closer to discharge-she will need to get in touch with her outpatient methadone clinic.  DVT prophylaxis: Lovenox Code Status: Full Family Communication: None at bedside Disposition:   Status is: Inpatient  Remains inpatient appropriate because:IV treatments appropriate  due to intensity of illness or inability to take  PO  Dispo: The patient is from: Home              Anticipated d/c is to: Home              Patient currently is not medically stable to d/c.   Difficult to place patient Yes     Consultants:  ID Ortho IR   Data Reviewed: I have personally reviewed following labs and imaging studies  CBC: Recent Labs  Lab 01/29/21 0101 02/01/21 0549 02/04/21 0408  WBC 12.3* 10.4 10.4  HGB 8.3* 7.6* 7.6*  HCT 27.7* 24.8* 25.7*  MCV 81.0 81.8 84.8  PLT 620* 626* 486*     Basic Metabolic Panel: Recent Labs  Lab 01/29/21 0101 01/30/21 0824 01/30/21 1607 01/31/21 0611 01/31/21 0755 02/01/21 0442 02/02/21 0334 02/04/21 0408  NA 129* 131* 133*  --  131* 133* 130* 131*  K 4.8 6.2* 4.9  --  5.3* 5.0 4.9 4.6  CL 95* 98 97*  --  97* 97* 93* 93*  CO2 23 22 25   --  24 24 30 30   GLUCOSE 96 76 101*  --  89 126* 93 106*  BUN 11 16 15   --  13 13 12 14   CREATININE 0.53 0.49 0.55  --  0.55 0.61 0.53 0.56  CALCIUM 8.4* 8.3* 8.4*  --  8.8* 8.8* 8.9 9.1  MG 1.6* 2.0  --  1.7  --  2.0  --  1.8     GFR: Estimated Creatinine Clearance: 107.1 mL/min (by C-G formula based on SCr of 0.56 mg/dL).  Liver Function Tests: No results for input(s): AST, ALT, ALKPHOS, BILITOT, PROT, ALBUMIN in the last 168 hours.   CBG: No results for input(s): GLUCAP in the last 168 hours.   Recent Results (from the past 240 hour(s))  Culture, blood (routine x 2)     Status: None   Collection Time: 01/26/21  5:06 AM   Specimen: BLOOD RIGHT HAND  Result Value Ref Range Status   Specimen Description BLOOD RIGHT HAND  Final   Special Requests AEROBIC BOTTLE ONLY Blood Culture adequate volume  Final   Culture   Final    NO GROWTH 5 DAYS Performed at [facility], [address], [place], [place] [address]    Report Status 01/31/2021 FINAL  Final  Culture, blood (routine x 2)     Status: None   Collection Time: 01/26/21  5:06 AM   Specimen: BLOOD LEFT HAND   Result Value Ref Range Status   Specimen Description BLOOD LEFT HAND  Final   Special Requests AEROBIC BOTTLE ONLY Blood Culture adequate volume  Final   Culture   Final    NO GROWTH 5 DAYS Performed at [facility], [place] [address] [place], [place] [address]    Report Status 01/31/2021 FINAL  Final  Aerobic/Anaerobic Culture w Gram Stain (surgical/deep wound)     Status: None   Collection Time: 01/26/21 10:58 AM   Specimen: Abscess  Result Value Ref Range Status   Specimen Description ABSCESS ASPIRATE  Final   Special Requests SYMPHYSIS PUBIS  Final   Gram Stain   Final    ABUNDANT WBC PRESENT, PREDOMINANTLY PMN FEW GRAM POSITIVE COCCI IN CLUSTERS    Culture   Final    FEW METHICILLIN RESISTANT STAPHYLOCOCCUS AUREUS NO ANAEROBES ISOLATED Performed at [facility], [place] [address] [place], [place] [address]    Report Status 01/31/2021 FINAL  Final  Organism ID, Bacteria METHICILLIN RESISTANT STAPHYLOCOCCUS AUREUS  Final      Susceptibility   Methicillin resistant staphylococcus aureus - MIC*    CIPROFLOXACIN >=8 RESISTANT Resistant     ERYTHROMYCIN >=8 RESISTANT Resistant     GENTAMICIN <=0.5 SENSITIVE Sensitive     OXACILLIN >=4 RESISTANT Resistant     TETRACYCLINE >=16 RESISTANT Resistant     VANCOMYCIN <=0.5 SENSITIVE Sensitive     TRIMETH/SULFA >=320 RESISTANT Resistant     CLINDAMYCIN <=0.25 SENSITIVE Sensitive     RIFAMPIN <=0.5 SENSITIVE Sensitive     Inducible Clindamycin NEGATIVE Sensitive     * FEW METHICILLIN RESISTANT STAPHYLOCOCCUS AUREUS          Radiology Studies: No results found.      Scheduled Meds:  Chlorhexidine Gluconate Cloth  6 each Topical Q0600   enoxaparin (LOVENOX) injection  40 mg Subcutaneous Q24H   [START ON 02/05/2021] magnesium oxide  400 mg Oral Daily   methadone  100 mg Oral Daily   naloxegol oxalate  25 mg Oral QAC breakfast   polyethylene glycol  17 g Oral TID   senna-docusate  2 tablet Oral BID   sodium  chloride flush  10-40 mL Intracatheter Q12H   sodium phosphate  1 enema Rectal Once   sodium zirconium cyclosilicate  10 g Oral Daily   tamsulosin  0.4 mg Oral Daily   Continuous Infusions:  sodium chloride Stopped (01/28/21 0504)   DAPTOmycin (CUBICIN)  IV 650 mg (02/03/21 2153)     LOS: 16 days        Oren Binet, MD [facility]   To contact the attending provider between 7A-7P or the covering provider during after hours 7P-7A, please log into the web site [email or web address] and access using universal [place] password for that web site. If you do not have the password, please call the hospital operator.  02/04/2021, 3:27 PM

## 2021-02-05 DIAGNOSIS — R7881 Bacteremia: Secondary | ICD-10-CM | POA: Diagnosis not present

## 2021-02-05 DIAGNOSIS — B9562 Methicillin resistant Staphylococcus aureus infection as the cause of diseases classified elsewhere: Secondary | ICD-10-CM | POA: Diagnosis not present

## 2021-02-05 NOTE — Progress Notes (Signed)
PROGRESS NOTE    Kelly [name]  IEP:[id number] DOB: [birth year] DOA: 01/19/2021 PCP: Pcp, No   Chief Complaint  Patient presents with   Abdominal Pain    Brief Narrative:  Kelly [name] is a 35 y.o. 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  Subjective: Patient in bed, appears comfortable, denies any headache, no fever, no chest pain or pressure, no shortness of breath , no abdominal pain. No new focal weakness.   Objective: Vitals: Blood pressure 114/72, pulse 92, temperature 97.6 F (36.4 C), temperature source Oral, resp. rate (!) 22, height 5' 7"  (1.702 m), weight 82 kg, SpO2 96 %, unknown if currently breastfeeding.   Exam:  Awake Alert, No new F.N deficits, Normal affect [place].AT,PERRAL Supple Neck,No JVD, No cervical lymphadenopathy appriciated.  Symmetrical Chest wall movement, Good air movement bilaterally, CTAB RRR,No Gallops, Rubs, mild systolic murmur +ve B.Sounds, Abd Soft, No tenderness, No organomegaly appriciated, No rebound - guarding or rigidity. No Cyanosis, Clubbing or edema, No new Rash or bruise     Mircobiology: Blood culture on 9/8, 9/12 and 9/14: MRSA Blood culutre on 9/15: neg Pelvic abscess cx 9/15: MRSA  MRI pelvis on 9/13: Septic arthritis of pubic symphysis/osteomyelitis and surrounding abscesses Echo on 9/8: EF 66-06%, RV systolic function reduced.  RVSP 68.6 mmHg.  Severe TR.  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  Per ID-no benefit to repeating a echo/TEE-as it would not change management (patient not a candidate for surgery).  ID recommending daptomycin 6 weeks from 9/15 (PICC line in place)  Bacterial vaginosis: Completed a course of oral Flagyl  Hyponatremia: Euvolemic-mild hyponatremia persists-maintain fluid restriction.  Hyperkalemia: Unclear etiology-resolved-however on the higher normal side-but stable on Lokelma over the past few days.  Follow chemistries x3 days to ensure stability.  Hypomagnesemia: On oral magnesium supplementation-monitor electrolytes.  Normocytic anemia: No overt blood loss-anemia due to critical/acute illness-follow-and transfuse if <7   Acute urinary retention: Failed voiding trial again on 9/20-Foley replaced on 9/21.  We will attempt voiding trial in 2 weeks (since this is his second failure).  Continue Flomax.  Prolonged QTC: Resolved-stable QTC on 9/9 and 9/18.     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 difficile infection in the past  Constipation: Likely due to narcotics-improving-still with some hard stools-on scheduled MiraLAX/senna and Movantik.  Received numerous enemas over the past few days.  Continue to watch closely.  History of intravenous drug use (UDS on 9/9 positive for benzos, opiates, amphetamine and cocaine): Stable on methadone 100 mg daily (claims to be on 120 mg prior to this hospitalization) and IV Dilaudid for breakthrough pain.  On bowel regimen as noted above.  Patient aware that she will not be prescribed narcotics on discharge-and that she needs to contact her caseworker/outpatient methadone clinic for further narcotic needs.  She is agreeable with this plan.  She understands that when she gets closer to discharge-she will need to get in touch with her outpatient methadone clinic.  DVT prophylaxis: Lovenox Code Status:  Full Family Communication: None at bedside Disposition:   Status is: Inpatient  Remains inpatient appropriate because:IV treatments appropriate due to intensity of illness or inability to take PO  Dispo: The patient is from: Home              Anticipated d/c is to: Home              Patient currently is not medically stable to d/c.   Difficult to place patient Yes     Consultants:  ID Ortho IR   Data Reviewed: I have personally reviewed following labs and imaging studies  Recent Labs  Lab 02/01/21 0549 02/04/21 0408  WBC 10.4 10.4  HGB 7.6* 7.6*  HCT 24.8* 25.7*  PLT 626* 486*  MCV 81.8 84.8  MCH 25.1* 25.1*  MCHC 30.6 29.6*  RDW 20.5* 20.2*    Recent Labs  Lab 01/30/21 0824 01/30/21 1607 01/31/21 0611 01/31/21 0755 02/01/21 0442 02/02/21 0334 02/03/21 0408 02/04/21 0408  NA 131* 133*  --  131* 133* 130*  --  131*  K 6.2* 4.9  --  5.3* 5.0 4.9  --  4.6  CL 98 97*  --  97* 97* 93*  --  93*  CO2 22 25  --  24 24 30   --  30  GLUCOSE 76 101*  --  89 126* 93  --  106*  BUN 16 15  --  13 13 12   --  14  CREATININE 0.49 0.55  --  0.55 0.61 0.53  --  0.56  CALCIUM 8.3* 8.4*  --  8.8* 8.8* 8.9  --  9.1  MG 2.0  --  1.7  --  2.0  --   --  1.8  CRP  --   --   --   --   --   --  7.0*  --     Radiology Studies: No results found.  Scheduled Meds:  Chlorhexidine Gluconate Cloth  6 each Topical Q0600   enoxaparin (LOVENOX) injection  40 mg Subcutaneous Q24H   magnesium oxide  400 mg Oral Daily   methadone  100 mg Oral Daily   naloxegol oxalate  25 mg Oral QAC breakfast   polyethylene glycol  17 g Oral TID   senna-docusate  2 tablet Oral BID   sodium chloride flush  10-40 mL Intracatheter Q12H   sodium phosphate  1 enema Rectal Once   sodium zirconium cyclosilicate  10 g Oral Daily   tamsulosin  0.4 mg Oral Daily   Continuous Infusions:  sodium chloride Stopped (01/28/21 0504)   DAPTOmycin (CUBICIN)  IV 650 mg (02/04/21 2011)     LOS: 17 days    Signature  Lala Lund M.D on 02/05/2021 at 11:19 AM   -  To page go to [email or web address]

## 2021-02-06 DIAGNOSIS — B9562 Methicillin resistant Staphylococcus aureus infection as the cause of diseases classified elsewhere: Secondary | ICD-10-CM | POA: Diagnosis not present

## 2021-02-06 DIAGNOSIS — R7881 Bacteremia: Secondary | ICD-10-CM | POA: Diagnosis not present

## 2021-02-06 LAB — MIC RESULTS (2 DRUGS)

## 2021-02-06 MED ORDER — FUROSEMIDE 40 MG PO TABS
40.0000 mg | ORAL_TABLET | Freq: Once | ORAL | Status: AC
  Administered 2021-02-06: 40 mg via ORAL
  Filled 2021-02-06: qty 1

## 2021-02-06 NOTE — Progress Notes (Signed)
PROGRESS NOTE    Kelly [name]  PNT:[id number] DOB: 12-Jul-1985 DOA: 01/19/2021 PCP: Pcp, No   Chief Complaint  Patient presents with   Abdominal Pain    Brief Narrative:  Kelly [name] is a 35 y.o. 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  Subjective: Patient in bed, appears comfortable, denies any headache, no fever, no chest pain or pressure, no shortness of breath , no abdominal pain. No new focal weakness.    Objective: Vitals: Blood pressure 108/74, pulse 75, temperature 97.9 F (36.6 C), temperature source Oral, resp. rate 17, height 5' 7"  (1.702 m), weight 82 kg, SpO2 92 %, unknown if currently breastfeeding.   Exam:  Awake Alert, No new F.N deficits, Normal affect [place].AT,PERRAL Supple Neck,No JVD, No cervical lymphadenopathy appriciated.  Symmetrical Chest wall movement, Good air movement bilaterally, CTAB RRR,No Gallops, Rubs or new Murmurs, No Parasternal Heave +ve B.Sounds, Abd Soft, No tenderness, No organomegaly appriciated, No rebound - guarding or rigidity. No Cyanosis, Clubbing or edema, No new Rash or bruise      Mircobiology: Blood culture on 9/8, 9/12 and 9/14: MRSA Blood culutre on 9/15: neg Pelvic abscess cx 9/15: MRSA  MRI pelvis on 9/13: Septic arthritis of pubic symphysis/osteomyelitis and surrounding abscesses Echo on 9/8: EF 08-67%, RV systolic function reduced.  RVSP 68.6 mmHg.  Severe TR.  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  Per ID-no benefit to repeating a echo/TEE-as it would not change management (patient not a candidate for surgery).  ID recommending daptomycin 6 weeks from 9/15 (PICC line in place)  Bacterial vaginosis: Completed a course of oral Flagyl  Hyponatremia: Euvolemic-mild hyponatremia persists-maintain fluid restriction.  Hyperkalemia: Unclear etiology-resolved-however on the higher normal side-but stable on Lokelma over the past few days.  Follow chemistries x3 days to ensure stability.  Hypomagnesemia: On oral magnesium supplementation-monitor electrolytes.  Normocytic anemia: No overt blood loss-anemia due to critical/acute illness-follow-and transfuse if <7   Acute urinary retention: Failed voiding trial again on 9/20-Foley replaced on 9/21.  We will attempt voiding trial in 2 weeks (since this is his second failure).  Continue Flomax.  Prolonged QTC: Resolved-stable QTC on 9/9 and 9/18.     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 difficile infection in the past  Constipation: Likely due to narcotics-improving-still with some hard stools-on scheduled MiraLAX/senna and Movantik.  Received numerous enemas over the past few days.  Continue to watch closely.  History of intravenous drug use (UDS on 9/9 positive for benzos, opiates, amphetamine and cocaine): Stable on methadone 100 mg daily (claims to be on 120 mg prior to this hospitalization) and IV Dilaudid for breakthrough pain.  On bowel regimen as noted above.  Patient aware that she will not be prescribed narcotics on discharge-and that she needs to contact her caseworker/outpatient methadone clinic for further narcotic needs.  She is agreeable with this plan.  She understands that when she gets closer to discharge-she will need to get in touch with her  outpatient methadone clinic.    DVT prophylaxis: Lovenox Code  Status: Full Family Communication: None at bedside Disposition:   Status is: Inpatient  Remains inpatient appropriate because:IV treatments appropriate due to intensity of illness or inability to take PO  Dispo: The patient is from: Home              Anticipated d/c is to: Home              Patient currently is not medically stable to d/c.   Difficult to place patient Yes     Consultants:  ID Ortho IR   Data Reviewed: I have personally reviewed following labs and imaging studies  Recent Labs  Lab 02/01/21 0549 02/04/21 0408  WBC 10.4 10.4  HGB 7.6* 7.6*  HCT 24.8* 25.7*  PLT 626* 486*  MCV 81.8 84.8  MCH 25.1* 25.1*  MCHC 30.6 29.6*  RDW 20.5* 20.2*    Recent Labs  Lab 01/30/21 1607 01/31/21 0611 01/31/21 0755 02/01/21 0442 02/02/21 0334 02/03/21 0408 02/04/21 0408  NA 133*  --  131* 133* 130*  --  131*  K 4.9  --  5.3* 5.0 4.9  --  4.6  CL 97*  --  97* 97* 93*  --  93*  CO2 25  --  24 24 30   --  30  GLUCOSE 101*  --  89 126* 93  --  106*  BUN 15  --  13 13 12   --  14  CREATININE 0.55  --  0.55 0.61 0.53  --  0.56  CALCIUM 8.4*  --  8.8* 8.8* 8.9  --  9.1  MG  --  1.7  --  2.0  --   --  1.8  CRP  --   --   --   --   --  7.0*  --     Radiology Studies: No results found.  Scheduled Meds:  Chlorhexidine Gluconate Cloth  6 each Topical Q0600   enoxaparin (LOVENOX) injection  40 mg Subcutaneous Q24H   magnesium oxide  400 mg Oral Daily   methadone  100 mg Oral Daily   naloxegol oxalate  25 mg Oral QAC breakfast   polyethylene glycol  17 g Oral TID   senna-docusate  2 tablet Oral BID   sodium chloride flush  10-40 mL Intracatheter Q12H   sodium phosphate  1 enema Rectal Once   sodium zirconium cyclosilicate  10 g Oral Daily   tamsulosin  0.4 mg Oral Daily   Continuous Infusions:  sodium chloride Stopped (01/28/21 0504)   DAPTOmycin (CUBICIN)  IV 650 mg (02/05/21 2113)     LOS: 18 days   Signature  Lala Lund M.D on 02/06/2021 at 12:07 PM    -  To page go to [email or web address]

## 2021-02-06 NOTE — Progress Notes (Signed)
Physical Therapy Treatment Patient Details Name: Kelly [name] MRN: [id number] DOB: 1986/02/01 Today's Date: 02/06/2021   History of Present Illness 35 y.o. female presents to [facility] hospital on 01/19/2021 with pelvic pain. Pt found to have sepsis 2/2 MRSA bacteremia. +pelvic abscesses with IR drainage 9/15; septic arthritis of pubic symphysis; pt will be in hospital x 6 weeks from 9/15 for antibiotics   PMH includes HTN, systolic CHF with moderate pulmonary hypertension and severe TR, polysubstance abuse including history of IV drug use, recurrent history of endocarditis, C. difficile colitis.    PT Comments    Prior to PT arrival, pt with episode of bowel incontinence while asleep. Assisted on/off BSC and pt able to stand for ~4 minutes with bil UE support on RW while PT performed pericare. Afterwards ambulated to/from door with incr time due to incr pain.     Recommendations for follow up therapy are one component of a multi-disciplinary discharge planning process, led by the attending physician.  Recommendations may be updated based on patient status, additional functional criteria and insurance authorization.  Follow Up Recommendations  Home health PT     Equipment Recommendations  Wheelchair (measurements PT)    Recommendations for Other Services       Precautions / Restrictions Precautions Precautions: Fall Restrictions Weight Bearing Restrictions: No     Mobility  Bed Mobility Overal bed mobility: Needs Assistance Bed Mobility: Supine to Sit     Supine to sit: Min assist     General bed mobility comments: Use of rails with therapist assisting with torso    Transfers Overall transfer level: Needs assistance Equipment used: Rolling walker (2 wheeled) Transfers: Sit to/from Stand Sit to Stand: Min assist;From elevated surface Stand pivot transfers: Min assist       General transfer comment: Increased time to rise; vc for hand placement/safe use of  RW  Ambulation/Gait Ambulation/Gait assistance: Min guard Gait Distance (Feet): 30 Feet Assistive device: Rolling walker (2 wheeled) Gait Pattern/deviations: Step-to pattern;Decreased stride length Gait velocity: very slow   General Gait Details: Slow with reliance on RW for pain control; very painful to advance legs using hip flexors   Stairs             Wheelchair Mobility    Modified Rankin (Stroke Patients Only)       Balance Overall balance assessment: Needs assistance Sitting-balance support: Feet supported;Bilateral upper extremity supported Sitting balance-Leahy Scale: Fair Sitting balance - Comments: UE support for pain control Postural control: Posterior lean Standing balance support: Bilateral upper extremity supported Standing balance-Leahy Scale: Poor Standing balance comment: UE support for pain control; stood x 4 minutes for pericare by PT                            Cognition Arousal/Alertness: Awake/alert Behavior During Therapy: WFL for tasks assessed/performed Overall Cognitive Status: Within Functional Limits for tasks assessed                                 General Comments: Pt polite , agreeable to therapy, and trying to move and participate despite pain      Exercises      General Comments General comments (skin integrity, edema, etc.): On arrival, pt had been incontinent of bowels while asleep. +very loose stool and pt reports she doesnt' even know when she has gone. Assisted OOB to [name] and with  pericare (pt dependent, instanding)      Pertinent Vitals/Pain Pain Assessment: 0-10 Pain Score: 7  Pain Location: groin when advancing legs Pain Descriptors / Indicators: Moaning;Grimacing;Guarding Pain Intervention(s): Limited activity within patient's tolerance    Home Living                      Prior Function            PT Goals (current goals can now be found in the care plan section) Acute  Rehab PT Goals Patient Stated Goal: to reduce pain PT Goal Formulation: With patient Time For Goal Achievement: 02/04/21 Potential to Achieve Goals: Fair Progress towards PT goals: Not progressing toward goals - comment (continues to be limited by pain)    Frequency    Min 2X/week      PT Plan Current plan remains appropriate;Frequency needs to be updated    Co-evaluation              AM-PAC PT "6 Clicks" Mobility   Outcome Measure  Help needed turning from your back to your side while in a flat bed without using bedrails?: A Little Help needed moving from lying on your back to sitting on the side of a flat bed without using bedrails?: A Little Help needed moving to and from a bed to a chair (including a wheelchair)?: A Little Help needed standing up from a chair using your arms (e.g., wheelchair or bedside chair)?: A Little Help needed to walk in hospital room?: A Little Help needed climbing 3-5 steps with a railing? : A Lot 6 Click Score: 17    End of Session   Activity Tolerance: Patient limited by pain Patient left: with call bell/phone within reach;in chair;with nursing/sitter in room Nurse Communication: Mobility status;Other (comment) (wants assist to bathe at sink) PT Visit Diagnosis: Unsteadiness on feet (R26.81);Other abnormalities of gait and mobility (R26.89);Muscle weakness (generalized) (M62.81);Pain Pain - part of body:  (pelvis and LEs)     Time: 6803-2122 PT Time Calculation (min) (ACUTE ONLY): 34 min  Charges:  $Gait Training: 8-22 mins $Therapeutic Activity: 8-22 mins                      Arby Barrette, PT Pager [phone]    Rexanne Mano 02/06/2021, 2:25 PM

## 2021-02-07 DIAGNOSIS — R7881 Bacteremia: Secondary | ICD-10-CM | POA: Diagnosis not present

## 2021-02-07 DIAGNOSIS — B9562 Methicillin resistant Staphylococcus aureus infection as the cause of diseases classified elsewhere: Secondary | ICD-10-CM | POA: Diagnosis not present

## 2021-02-07 LAB — MAGNESIUM: Magnesium: 1.9 mg/dL (ref 1.7–2.4)

## 2021-02-07 LAB — CBC
HCT: 26 % — ABNORMAL LOW (ref 36.0–46.0)
Hemoglobin: 7.7 g/dL — ABNORMAL LOW (ref 12.0–15.0)
MCH: 24.6 pg — ABNORMAL LOW (ref 26.0–34.0)
MCHC: 29.6 g/dL — ABNORMAL LOW (ref 30.0–36.0)
MCV: 83.1 fL (ref 80.0–100.0)
Platelets: 461 10*3/uL — ABNORMAL HIGH (ref 150–400)
RBC: 3.13 MIL/uL — ABNORMAL LOW (ref 3.87–5.11)
RDW: 20 % — ABNORMAL HIGH (ref 11.5–15.5)
WBC: 9.4 10*3/uL (ref 4.0–10.5)
nRBC: 0 % (ref 0.0–0.2)

## 2021-02-07 LAB — CREATININE, URINE, RANDOM: Creatinine, Urine: 62.45 mg/dL

## 2021-02-07 LAB — BASIC METABOLIC PANEL
Anion gap: 7 (ref 5–15)
BUN: 26 mg/dL — ABNORMAL HIGH (ref 6–20)
CO2: 31 mmol/L (ref 22–32)
Calcium: 9.1 mg/dL (ref 8.9–10.3)
Chloride: 93 mmol/L — ABNORMAL LOW (ref 98–111)
Creatinine, Ser: 0.59 mg/dL (ref 0.44–1.00)
GFR, Estimated: >60 mL/min (ref >60)
Glucose, Bld: 100 mg/dL — ABNORMAL HIGH (ref 70–99)
Potassium: 4.3 mmol/L (ref 3.5–5.1)
Sodium: 131 mmol/L — ABNORMAL LOW (ref 135–145)

## 2021-02-07 LAB — URINALYSIS, ROUTINE W REFLEX MICROSCOPIC
Bacteria, UA: NONE SEEN
Bilirubin Urine: NEGATIVE
Glucose, UA: NEGATIVE mg/dL
Ketones, ur: NEGATIVE mg/dL
Nitrite: NEGATIVE
Protein, ur: NEGATIVE mg/dL
Specific Gravity, Urine: 1.018 (ref 1.005–1.030)
pH: 6 (ref 5.0–8.0)

## 2021-02-07 LAB — OSMOLALITY, URINE: Osmolality, Ur: 645 mOsm/kg (ref 300–900)

## 2021-02-07 LAB — SODIUM, URINE, RANDOM: Sodium, Ur: 57 mmol/L

## 2021-02-07 LAB — OSMOLALITY: Osmolality: 299 mOsm/kg — ABNORMAL HIGH (ref 275–295)

## 2021-02-07 LAB — URIC ACID: Uric Acid, Serum: 5.4 mg/dL (ref 2.5–7.1)

## 2021-02-07 NOTE — Plan of Care (Signed)
Pt alert and oriented x4, reports pain to pelvic area, indwelling foley discontinued, pt has voided multiple times with emptying, denies bladder fullness, ambulating to bathroom with standby assist, sbp< 100 during shift, md reports to hold iv dilaudid if sbp<100, single lumen picc right arm, call bell and telephone within reach, bed in lowest and locked position.   Problem: Health Behavior/Discharge Planning: Goal: Ability to manage health-related needs will improve Outcome: Progressing   Problem: Clinical Measurements: Goal: Ability to maintain clinical measurements within normal limits will improve Outcome: Progressing Goal: Will remain free from infection Outcome: Progressing   Problem: Activity: Goal: Risk for activity intolerance will decrease Outcome: Progressing   Problem: Elimination: Goal: Will not experience complications related to bowel motility Outcome: Progressing   Problem: Pain Managment: Goal: General experience of comfort will improve Outcome: Progressing

## 2021-02-07 NOTE — Progress Notes (Addendum)
PROGRESS NOTE    Kelly [name]  JWJ:[id number] DOB: March 16, 1986 DOA: 01/19/2021 PCP: Pcp, No   Chief Complaint  Patient presents with   Abdominal Pain    Brief Narrative:  Kelly [name] is a 35 y.o. 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  Subjective: Patient in bed, appears comfortable, denies any headache, no fever, no chest pain or pressure, no shortness of breath , no abdominal pain. No new focal weakness.  Objective: Vitals: Blood pressure 95/66, pulse 93, temperature 98.1 F (36.7 C), temperature source Oral, resp. rate 19, height 5' 7"  (1.702 m), weight 82 kg, SpO2 92 %, unknown if currently breastfeeding.   Exam:  Awake Alert, No new F.N deficits, Normal affect [place].AT,PERRAL Supple Neck,No JVD, No cervical lymphadenopathy appriciated.  Symmetrical Chest wall movement, Good air movement bilaterally, CTAB RRR,No Gallops, Rubs or new Murmurs, No Parasternal Heave +ve B.Sounds, Abd Soft, No tenderness, No organomegaly appriciated, No rebound - guarding or rigidity. No Cyanosis, Clubbing or edema, No new Rash or bruise   Mircobiology:  Blood culture on 9/8, 9/12 and 9/14: MRSA Blood culutre on 9/15: neg Pelvic abscess cx 9/15: MRSA  MRI pelvis on 9/13: Septic arthritis of pubic symphysis/osteomyelitis and surrounding abscesses Echo on 9/8: EF 62-13%, RV systolic function reduced.  RVSP 68.6 mmHg.  Severe TR.  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  Per ID-no benefit to repeating a echo/TEE-as it would not change management (patient not a candidate for surgery).  ID recommending daptomycin 6 weeks from 9/15 (PICC line in place)  Bacterial vaginosis: Completed a course of oral Flagyl  Hyponatremia: Euvolemic-check Ur lytes and Sr Osm.  Normocytic anemia: No overt blood loss-anemia due to critical/acute illness-follow-and transfuse if <7   Acute urinary retention: Failed voiding trial again on 9/20-Foley replaced on 9/21.  Now that her constipation is much better we will give trial of Foley removal on 02/07/2021 and monitor.  Continue Flomax.  Prolonged QTC: Resolved-stable QTC on 9/9 and 9/18.     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 difficile infection in the past  Constipation: Likely due to narcotics-improving-still with some hard stools-on scheduled MiraLAX/senna and Movantik.  Received numerous enemas over the past few days.  Continue to watch closely.  History of intravenous drug use (UDS on 9/9 positive for benzos, opiates, amphetamine and cocaine): Stable on methadone 100 mg daily (claims to be on 120 mg prior to this hospitalization) and IV Dilaudid for breakthrough pain.  On bowel regimen as noted above.  Patient aware that she will not be prescribed narcotics on discharge-and that she needs to contact her caseworker/outpatient methadone clinic for further narcotic needs.  She is agreeable with this plan.  She understands that when she gets closer to discharge-she will need to get in touch with her outpatient methadone clinic.    DVT prophylaxis: Lovenox Code Status: Full Family Communication: None at bedside Disposition:   Status is: Inpatient  Remains inpatient appropriate because:IV treatments appropriate due  to intensity of illness or inability to take PO  Dispo: The patient is from: Home               Anticipated d/c is to: Home              Patient currently is not medically stable to d/c.   Difficult to place patient Yes     Consultants:  ID Ortho IR   Data Reviewed: I have personally reviewed following labs and imaging studies  Recent Labs  Lab 02/01/21 0549 02/04/21 0408 02/07/21 0413  WBC 10.4 10.4 9.4  HGB 7.6* 7.6* 7.7*  HCT 24.8* 25.7* 26.0*  PLT 626* 486* 461*  MCV 81.8 84.8 83.1  MCH 25.1* 25.1* 24.6*  MCHC 30.6 29.6* 29.6*  RDW 20.5* 20.2* 20.0*    Recent Labs  Lab 02/01/21 0442 02/02/21 0334 02/03/21 0408 02/04/21 0408 02/07/21 0413  NA 133* 130*  --  131* 131*  K 5.0 4.9  --  4.6 4.3  CL 97* 93*  --  93* 93*  CO2 24 30  --  30 31  GLUCOSE 126* 93  --  106* 100*  BUN 13 12  --  14 26*  CREATININE 0.61 0.53  --  0.56 0.59  CALCIUM 8.8* 8.9  --  9.1 9.1  MG 2.0  --   --  1.8 1.9  CRP  --   --  7.0*  --   --     Radiology Studies: No results found.  Scheduled Meds:  Chlorhexidine Gluconate Cloth  6 each Topical Q0600   enoxaparin (LOVENOX) injection  40 mg Subcutaneous Q24H   magnesium oxide  400 mg Oral Daily   methadone  100 mg Oral Daily   naloxegol oxalate  25 mg Oral QAC breakfast   polyethylene glycol  17 g Oral TID   senna-docusate  2 tablet Oral BID   sodium chloride flush  10-40 mL Intracatheter Q12H   sodium zirconium cyclosilicate  10 g Oral Daily   tamsulosin  0.4 mg Oral Daily   Continuous Infusions:  sodium chloride Stopped (01/28/21 0504)   DAPTOmycin (CUBICIN)  IV Stopped (02/07/21 0730)     LOS: 19 days   Signature  Lala Lund M.D on 02/07/2021 at 9:56 AM   -  To page go to [email or web address]

## 2021-02-07 NOTE — Progress Notes (Signed)
Pt requested pain medication noted current vitals:  bp is 93/64 map 71 up from 89/62. resp:16 02 sat 91% room air. Pt currently lying in bed with tv on laughing and remarking at tv show, no immediate distress noted at this time, educated pt on current vitals and need for md confirmation before administering iv pain medication, Contacted md to relay current vitals, md reported do not administer iv dilaudid if sbp <100, educated pt, offered prn tylenol, pt refused stating it will not do anything, will continue to monitor.

## 2021-02-07 NOTE — Progress Notes (Signed)
Pt called to have blood pressure taken to see if she can have her iv dilaudid, current bp: 103/69 map 81, iv dilaudid provided at this time.

## 2021-02-08 DIAGNOSIS — B9562 Methicillin resistant Staphylococcus aureus infection as the cause of diseases classified elsewhere: Secondary | ICD-10-CM | POA: Diagnosis not present

## 2021-02-08 DIAGNOSIS — R7881 Bacteremia: Secondary | ICD-10-CM | POA: Diagnosis not present

## 2021-02-08 LAB — BASIC METABOLIC PANEL
Anion gap: 10 (ref 5–15)
BUN: 29 mg/dL — ABNORMAL HIGH (ref 6–20)
CO2: 29 mmol/L (ref 22–32)
Calcium: 9.2 mg/dL (ref 8.9–10.3)
Chloride: 93 mmol/L — ABNORMAL LOW (ref 98–111)
Creatinine, Ser: 0.58 mg/dL (ref 0.44–1.00)
GFR, Estimated: >60 mL/min (ref >60)
Glucose, Bld: 128 mg/dL — ABNORMAL HIGH (ref 70–99)
Potassium: 4.2 mmol/L (ref 3.5–5.1)
Sodium: 132 mmol/L — ABNORMAL LOW (ref 135–145)

## 2021-02-08 LAB — UREA NITROGEN, URINE: Urea Nitrogen, Ur: 1103 mg/dL

## 2021-02-08 LAB — CULTURE, BLOOD (ROUTINE X 2): Special Requests: ADEQUATE

## 2021-02-08 LAB — MIN INHIBITORY CONC (2 DRUGS): Source (MIC2): 880055

## 2021-02-08 LAB — GLUCOSE, CAPILLARY: Glucose-Capillary: 141 mg/dL — ABNORMAL HIGH (ref 70–99)

## 2021-02-08 LAB — MIC RESULT

## 2021-02-08 LAB — MINIMUM INHIBITORY CONC. (1 DRUG)

## 2021-02-08 LAB — CK: Total CK: 21 U/L — ABNORMAL LOW (ref 38–234)

## 2021-02-08 MED ORDER — HYDROMORPHONE HCL 1 MG/ML IJ SOLN
1.0000 mg | INTRAMUSCULAR | Status: DC | PRN
  Administered 2021-02-08 – 2021-02-11 (×14): 1 mg via INTRAVENOUS
  Filled 2021-02-08 (×14): qty 1

## 2021-02-08 NOTE — Plan of Care (Signed)
Pt alert and oriented x4 during am shift, reports pain to pelvic area, ambulating to restroom with walker use, denies urine retention, iv pain medication dosing  and time adjusted, call bell within reach, no concerns noted.   Problem: Health Behavior/Discharge Planning: Goal: Ability to manage health-related needs will improve Outcome: Progressing   Problem: Clinical Measurements: Goal: Will remain free from infection Outcome: Progressing   Problem: Activity: Goal: Risk for activity intolerance will decrease Outcome: Progressing   Problem: Elimination: Goal: Will not experience complications related to bowel motility Outcome: Progressing   Problem: Pain Managment: Goal: General experience of comfort will improve Outcome: Progressing   Problem: Skin Integrity: Goal: Risk for impaired skin integrity will decrease Outcome: Progressing

## 2021-02-08 NOTE — Progress Notes (Signed)
[name] for Infectious Disease    Date of Admission:  01/19/2021     ID: Kelly [name] is a 35 y.o. female with  MRSA bacteremia/TV endocarditis/pelvic osteomyelitis Principal Problem:   MRSA bacteremia Active Problems:   Back pain   Polysubstance abuse (HCC)   IVDU (intravenous drug user)   Hyponatremia   Endocarditis of tricuspid valve   Severe tricuspid regurgitation   Prolonged QT interval   Sepsis (HCC)   Constipation   Pelvic fluid collection   Acute osteomyelitis of pelvic region [facility])    Subjective: Afebrile but has left lower jaw pain from dental decay  Medications:   Chlorhexidine Gluconate Cloth  6 each Topical Q0600   enoxaparin (LOVENOX) injection  40 mg Subcutaneous Q24H   magnesium oxide  400 mg Oral Daily   methadone  100 mg Oral Daily   naloxegol oxalate  25 mg Oral QAC breakfast   polyethylene glycol  17 g Oral TID   senna-docusate  2 tablet Oral BID   sodium chloride flush  10-40 mL Intracatheter Q12H   sodium zirconium cyclosilicate  10 g Oral Daily   tamsulosin  0.4 mg Oral Daily    Objective: Vital signs in last 24 hours: Temp:  [97.8 F (36.6 C)-98.3 F (36.8 C)] 97.9 F (36.6 C) (09/28 1557) Pulse Rate:  [77-90] 90 (09/28 1557) Resp:  [15-19] 16 (09/28 1557) BP: (90-103)/(57-69) 94/63 (09/28 1557) SpO2:  [92 %-100 %] 94 % (09/28 1557) Physical Exam  Constitutional:  oriented to person, place, and time. appears well-developed and well-nourished. No distress.  HENT: [place]/AT, PERRLA, no scleral icterus; poor dentition Mouth/Throat: Oropharynx is clear and moist. No oropharyngeal exudate.  Cardiovascular: Normal rate, regular rhythm and normal heart sounds. Exam reveals no gallop and no friction rub.  No murmur heard.  Pulmonary/Chest: Effort normal and breath sounds normal. No respiratory distress.  has no wheezes.  Neck = supple, no nuchal rigidity Abdominal: Soft. Bowel sounds are normal.  exhibits no distension. There is no  tenderness.  Lymphadenopathy: no cervical adenopathy. No axillary adenopathy Neurological: alert and oriented to person, place, and time.  Skin: Skin is warm and dry. No rash noted. No erythema.  Psychiatric: a normal mood and affect.  behavior is normal.    Lab Results Recent Labs    02/07/21 0413 02/08/21 0500  WBC 9.4  --   HGB 7.7*  --   HCT 26.0*  --   NA 131* 132*  K 4.3 4.2  CL 93* 93*  CO2 31 29  BUN 26* 29*  CREATININE 0.59 0.58     Microbiology: 9/15  Susceptibility   Methicillin resistant staphylococcus aureus    MIC    CIPROFLOXACIN >=8 RESISTANT  Resistant    CLINDAMYCIN <=0.25 SENS... Sensitive    ERYTHROMYCIN >=8 RESISTANT  Resistant    GENTAMICIN <=0.5 SENSI... Sensitive    Inducible Clindamycin NEGATIVE  Sensitive    OXACILLIN >=4 RESISTANT  Resistant    RIFAMPIN <=0.5 SENSI... Sensitive    TETRACYCLINE >=16 RESIST... Resistant    TRIMETH/SULFA >=320 RESIS... Resistant    VANCOMYCIN <=0.5 SENSI... Sensitive           Studies/Results: No results found.   Assessment/Plan: MRSA bacteremia with pelvic osteomyelitis = plan for 6 wk, with daptomycin. Few oral options for her.  Therapeutic drug monitoring = will check weekly ck level  Left lower jaw pain = recommend pantogram to see if any abscess tooth  [facility]  Center for Infectious Diseases Pager: [phone]  02/08/2021, 4:59 PM

## 2021-02-08 NOTE — Progress Notes (Signed)
PROGRESS NOTE    Kelly [name]  TZG:[id number] DOB: February 14, 1986 DOA: 01/19/2021 PCP: Pcp, No   Chief Complaint  Patient presents with   Abdominal Pain    Brief Narrative:  Kelly [name] is a 35 y.o. 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  Subjective: Patient in bed, appears comfortable, denies any headache, no fever, no chest pain or pressure, no shortness of breath , no abdominal pain. No new focal weakness.   Objective: Vitals: Blood pressure (!) 92/57, pulse 81, temperature 98.3 F (36.8 C), temperature source Oral, resp. rate 15, height 5' 7"  (1.702 m), weight 82 kg, SpO2 93 %, unknown if currently breastfeeding.   Exam:  Awake Alert, No new F.N deficits, Normal affect [place].AT,PERRAL Supple Neck,No JVD, No cervical lymphadenopathy appriciated.  Symmetrical Chest wall movement, Good air movement bilaterally, CTAB RRR,No Gallops, Rubs or new Murmurs, No Parasternal Heave +ve B.Sounds, Abd Soft, No tenderness, No organomegaly appriciated, No rebound - guarding or rigidity. No Cyanosis, Clubbing or edema, No new Rash or bruise    Mircobiology:  Blood culture on 9/8, 9/12 and 9/14: MRSA Blood culutre on 9/15: neg Pelvic abscess cx 9/15: MRSA  MRI pelvis on 9/13: Septic arthritis of pubic symphysis/osteomyelitis and surrounding abscesses Echo on 9/8: EF 75-91%, RV systolic function reduced.  RVSP 68.6 mmHg.  Severe TR.  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  Per ID-no benefit to repeating a echo/TEE-as it would not change management (patient not a candidate for surgery).  ID recommending daptomycin 6 weeks from 9/15 (PICC line in place)  Bacterial vaginosis: Completed a course of oral Flagyl  Hyponatremia: Euvolemic-check Ur lytes and Sr Osm.  Normocytic anemia: No overt blood loss-anemia due to critical/acute illness-follow-and transfuse if <7   Acute urinary retention: Foley was removed on 02/07/2021, passing urine,.  Continue Flomax.  Prolonged QTC: Resolved-stable QTC on 9/9 and 9/18.     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 difficile infection in the past  Constipation: Likely due to narcotics-improving-still with some hard stools-on scheduled MiraLAX/senna and Movantik.  Received numerous enemas over the past few days.  Continue to watch closely.  History of intravenous drug use (UDS on 9/9 positive for benzos, opiates, amphetamine and cocaine): Stable on methadone 100 mg daily (claims to be on 120 mg prior to this hospitalization) and IV Dilaudid for breakthrough pain.  On bowel regimen as noted above.  Patient aware that she will not be prescribed narcotics on discharge-and that she needs to contact her caseworker/outpatient methadone clinic for further narcotic needs.  She is agreeable with this plan.  She understands that when she gets closer to discharge-she will need to get in touch with her outpatient methadone clinic.    DVT prophylaxis: Lovenox Code Status: Full Family Communication: None at bedside Disposition:   Status is: Inpatient  Remains inpatient appropriate because:IV treatments appropriate due to intensity of illness or inability to take PO  Dispo: The patient is from: Home  Anticipated d/c is to: Home              Patient currently is not medically stable to d/c.   Difficult  to place patient Yes     Consultants:  ID Ortho IR   Data Reviewed: I have personally reviewed following labs and imaging studies  Recent Labs  Lab 02/04/21 0408 02/07/21 0413  WBC 10.4 9.4  HGB 7.6* 7.7*  HCT 25.7* 26.0*  PLT 486* 461*  MCV 84.8 83.1  MCH 25.1* 24.6*  MCHC 29.6* 29.6*  RDW 20.2* 20.0*    Recent Labs  Lab 02/02/21 0334 02/03/21 0408 02/04/21 0408 02/07/21 0413 02/08/21 0500  NA 130*  --  131* 131* 132*  K 4.9  --  4.6 4.3 4.2  CL 93*  --  93* 93* 93*  CO2 30  --  30 31 29   GLUCOSE 93  --  106* 100* 128*  BUN 12  --  14 26* 29*  CREATININE 0.53  --  0.56 0.59 0.58  CALCIUM 8.9  --  9.1 9.1 9.2  MG  --   --  1.8 1.9  --   CRP  --  7.0*  --   --   --     Radiology Studies: No results found.  Scheduled Meds:  Chlorhexidine Gluconate Cloth  6 each Topical Q0600   enoxaparin (LOVENOX) injection  40 mg Subcutaneous Q24H   magnesium oxide  400 mg Oral Daily   methadone  100 mg Oral Daily   naloxegol oxalate  25 mg Oral QAC breakfast   polyethylene glycol  17 g Oral TID   senna-docusate  2 tablet Oral BID   sodium chloride flush  10-40 mL Intracatheter Q12H   sodium zirconium cyclosilicate  10 g Oral Daily   tamsulosin  0.4 mg Oral Daily   Continuous Infusions:  sodium chloride Stopped (01/28/21 0504)   DAPTOmycin (CUBICIN)  IV 650 mg (02/07/21 2109)     LOS: 20 days   Signature  Lala Lund M.D on 02/08/2021 at 10:55 AM   -  To page go to [email or web address]

## 2021-02-09 DIAGNOSIS — B9562 Methicillin resistant Staphylococcus aureus infection as the cause of diseases classified elsewhere: Secondary | ICD-10-CM | POA: Diagnosis not present

## 2021-02-09 DIAGNOSIS — R7881 Bacteremia: Secondary | ICD-10-CM | POA: Diagnosis not present

## 2021-02-09 MED ORDER — FUROSEMIDE 40 MG PO TABS
40.0000 mg | ORAL_TABLET | Freq: Once | ORAL | Status: DC
  Filled 2021-02-09: qty 1

## 2021-02-09 NOTE — Plan of Care (Signed)
  Problem: Health Behavior/Discharge Planning: Goal: Ability to manage health-related needs will improve Outcome: Progressing   Problem: Clinical Measurements: Goal: Ability to maintain clinical measurements within normal limits will improve Outcome: Progressing Goal: Will remain free from infection Outcome: Progressing Goal: Diagnostic test results will improve Outcome: Progressing Goal: Cardiovascular complication will be avoided Outcome: Progressing   Problem: Activity: Goal: Risk for activity intolerance will decrease Outcome: Progressing   Problem: Coping: Goal: Level of anxiety will decrease Outcome: Progressing   Problem: Elimination: Goal: Will not experience complications related to bowel motility Outcome: Progressing Goal: Will not experience complications related to urinary retention Outcome: Progressing   Problem: Pain Managment: Goal: General experience of comfort will improve Outcome: Progressing   Problem: Safety: Goal: Ability to remain free from injury will improve Outcome: Progressing   Problem: Skin Integrity: Goal: Risk for impaired skin integrity will decrease Outcome: Progressing   Problem: Fluid Volume: Goal: Hemodynamic stability will improve Outcome: Progressing   Problem: Clinical Measurements: Goal: Diagnostic test results will improve Outcome: Progressing Goal: Signs and symptoms of infection will decrease Outcome: Progressing

## 2021-02-09 NOTE — Progress Notes (Signed)
Physical Therapy Treatment Patient Details Name: Kelly [name] MRN: [id number] DOB: Sep 27, 1985 Today's Date: 02/09/2021   History of Present Illness 35 y.o. female presents to [facility] hospital on 01/19/2021 with pelvic pain. Pt found to have sepsis 2/2 MRSA bacteremia. +pelvic abscesses with IR drainage 9/15; septic arthritis of pubic symphysis; pt will be in hospital x 6 weeks from 9/15 for antibiotics   PMH includes HTN, systolic CHF with moderate pulmonary hypertension and severe TR, polysubstance abuse including history of IV drug use, recurrent history of endocarditis, C. difficile colitis.    PT Comments    Patient now without foley and walking to bathroom with nursing assist. Requiring less assist and moving slightly faster, more fluid. Able to stand to brush teeth with no UE support. Will progress to stair training next session.     Recommendations for follow up therapy are one component of a multi-disciplinary discharge planning process, led by the attending physician.  Recommendations may be updated based on patient status, additional functional criteria and insurance authorization.  Follow Up Recommendations  No PT follow up     Equipment Recommendations  Rolling walker with 5" wheels;3in1 (PT)    Recommendations for Other Services       Precautions / Restrictions Precautions Precautions: Fall     Mobility  Bed Mobility Overal bed mobility: Needs Assistance Bed Mobility: Supine to Sit     Supine to sit: Supervision          Transfers Overall transfer level: Needs assistance Equipment used: Rolling walker (2 wheeled) Transfers: Sit to/from Stand Sit to Stand: Min guard;Min assist         General transfer comment: Increased time to rise; vc for hand placement/safe use of RW; x 1 min assist due to walker tipping as hands transitioned; x 3 reps min guard  Ambulation/Gait Ambulation/Gait assistance: Min guard Gait Distance (Feet): 30 Feet Assistive device:  Rolling walker (2 wheeled) Gait Pattern/deviations: Step-to pattern;Decreased stride length Gait velocity: very slow   General Gait Details: Slow, but incr velocity compared to previously. reliance on RW for pain control; reporting pain in left knee as bad as pelvic pain (reports she fell on her knee PTA)   Stairs             Wheelchair Mobility    Modified Rankin (Stroke Patients Only)       Balance Overall balance assessment: Needs assistance Sitting-balance support: Feet supported;Bilateral upper extremity supported Sitting balance-Leahy Scale: Fair Sitting balance - Comments: UE support for pain control Postural control: Posterior lean Standing balance support: No upper extremity supported Standing balance-Leahy Scale: Fair Standing balance comment: able to stand and brush teeth without UE assist and with minimal reaching                            Cognition Arousal/Alertness: Awake/alert Behavior During Therapy: WFL for tasks assessed/performed Overall Cognitive Status: Within Functional Limits for tasks assessed                                        Exercises General Exercises - Lower Extremity Hip Flexion/Marching: AROM;Both;5 reps;Standing (5 reps each leg in each position) Mini-Sqauts: AROM;Both;Other reps (comment) (7 reps) Other Exercises Other Exercises: pt with left knee pain limiting exercises this date. Pt reports she has independently been doing her supine exercises (heelslides, bridging, knee presses)  General Comments        Pertinent Vitals/Pain Pain Assessment: 0-10 Pain Score: 7  Pain Location: left knee and pelvis Pain Descriptors / Indicators: Guarding;Discomfort    Home Living                      Prior Function            PT Goals (current goals can now be found in the care plan section) Acute Rehab PT Goals Patient Stated Goal: to reduce pain PT Goal Formulation: With patient Time For  Goal Achievement: 02/23/21 Potential to Achieve Goals: Fair Progress towards PT goals: Progressing toward goals (Goals updated based on timeframe)    Frequency    Min 2X/week      PT Plan Discharge plan needs to be updated;Equipment recommendations need to be updated    Co-evaluation              AM-PAC PT "6 Clicks" Mobility   Outcome Measure  Help needed turning from your back to your side while in a flat bed without using bedrails?: A Little Help needed moving from lying on your back to sitting on the side of a flat bed without using bedrails?: A Little Help needed moving to and from a bed to a chair (including a wheelchair)?: A Little Help needed standing up from a chair using your arms (e.g., wheelchair or bedside chair)?: A Little Help needed to walk in hospital room?: A Little Help needed climbing 3-5 steps with a railing? : A Lot 6 Click Score: 17    End of Session   Activity Tolerance: Patient limited by pain Patient left: with call bell/phone within reach;in bed   PT Visit Diagnosis: Unsteadiness on feet (R26.81);Other abnormalities of gait and mobility (R26.89);Muscle weakness (generalized) (M62.81);Pain Pain - part of body:  (pelvis and LEs)     Time: 5537-4827 PT Time Calculation (min) (ACUTE ONLY): 23 min  Charges:  $Gait Training: 8-22 mins $Therapeutic Exercise: 8-22 mins                      Arby Barrette, PT Pager [phone]    Rexanne Mano 02/09/2021, 2:07 PM

## 2021-02-09 NOTE — Progress Notes (Signed)
PROGRESS NOTE    Kelly [name]  TLX:[id number] DOB: April 06, 1986 DOA: 01/19/2021 PCP: Pcp, No   Chief Complaint  Patient presents with   Abdominal Pain    Brief Narrative:  Kelly [name] is a 35 y.o. 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  Subjective: Patient in bed, appears comfortable, denies any headache, no fever, no chest pain or pressure, no shortness of breath , no abdominal pain. No new focal weakness.    Objective: Vitals: Blood pressure 101/67, pulse 77, temperature 97.7 F (36.5 C), temperature source Oral, resp. rate 19, height 5' 7"  (1.702 m), weight 82 kg, SpO2 100 %, unknown if currently breastfeeding.   Exam:  Awake Alert, No new F.N deficits, Normal affect [place].AT,PERRAL Supple Neck,No JVD, No cervical lymphadenopathy appriciated.  Symmetrical Chest wall movement, Good air movement bilaterally, CTAB RRR,No Gallops, Rubs or new Murmurs, No Parasternal Heave +ve B.Sounds, Abd Soft, No tenderness, No organomegaly appriciated, No rebound - guarding or rigidity. No Cyanosis, Clubbing or edema, No new Rash or bruise     Mircobiology:  Blood culture on 9/8, 9/12 and 9/14: MRSA Blood culutre on 9/15: neg Pelvic abscess cx 9/15: MRSA  MRI pelvis on 9/13: Septic arthritis of pubic symphysis/osteomyelitis and surrounding abscesses Echo on 9/8: EF 74-16%, RV systolic function reduced.  RVSP 68.6 mmHg.  Severe TR.  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  Per ID-no benefit to repeating a echo/TEE-as it would not change management (patient not a candidate for surgery).  ID recommending daptomycin 6 weeks from 9/15 (PICC line in place)  Bacterial vaginosis: Completed a course of oral Flagyl  Hyponatremia: SIADH - Euvolemic-check Ur lytes and Sr Osm, better with gentle Lasix and fluid restriction.  Normocytic anemia: No overt blood loss-anemia due to critical/acute illness-follow-and transfuse if <7   Acute urinary retention: Foley was removed on 02/07/2021, passing urine,.  Continue Flomax.  Prolonged QTC: Resolved-stable QTC on 9/9 and 9/18.     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 difficile infection in the past  Constipation: Likely due to narcotics-improving-still with some hard stools-on scheduled MiraLAX/senna and Movantik.  Received numerous enemas over the past few days.  Continue to watch closely.  History of intravenous drug use (UDS on 9/9 positive for benzos, opiates, amphetamine and cocaine): Stable on methadone 100 mg daily (claims to be on 120 mg prior to this hospitalization) and IV Dilaudid for breakthrough pain.  On bowel regimen as noted above.  Patient aware that she will not be prescribed narcotics on discharge-and that she needs to contact her caseworker/outpatient methadone clinic for further narcotic needs.  She is agreeable with this plan.  She understands that when she gets closer to discharge-she will need to get in touch with her outpatient methadone clinic.    DVT prophylaxis: Lovenox Code Status: Full Family Communication: None at bedside Disposition:   Status is: Inpatient  Remains inpatient appropriate because:IV treatments appropriate due to intensity of illness or inability to take  PO  Dispo: The patient is from: Home              Anticipated d/c is to: Home               Patient currently is not medically stable to d/c.   Difficult to place patient Yes     Consultants:  ID Ortho IR   Data Reviewed: I have personally reviewed following labs and imaging studies  Recent Labs  Lab 02/04/21 0408 02/07/21 0413  WBC 10.4 9.4  HGB 7.6* 7.7*  HCT 25.7* 26.0*  PLT 486* 461*  MCV 84.8 83.1  MCH 25.1* 24.6*  MCHC 29.6* 29.6*  RDW 20.2* 20.0*    Recent Labs  Lab 02/03/21 0408 02/04/21 0408 02/07/21 0413 02/08/21 0500  NA  --  131* 131* 132*  K  --  4.6 4.3 4.2  CL  --  93* 93* 93*  CO2  --  30 31 29   GLUCOSE  --  106* 100* 128*  BUN  --  14 26* 29*  CREATININE  --  0.56 0.59 0.58  CALCIUM  --  9.1 9.1 9.2  MG  --  1.8 1.9  --   CRP 7.0*  --   --   --     Radiology Studies: No results found.  Scheduled Meds:  Chlorhexidine Gluconate Cloth  6 each Topical Q0600   enoxaparin (LOVENOX) injection  40 mg Subcutaneous Q24H   furosemide  40 mg Oral Once   magnesium oxide  400 mg Oral Daily   methadone  100 mg Oral Daily   naloxegol oxalate  25 mg Oral QAC breakfast   polyethylene glycol  17 g Oral TID   senna-docusate  2 tablet Oral BID   sodium chloride flush  10-40 mL Intracatheter Q12H   sodium zirconium cyclosilicate  10 g Oral Daily   tamsulosin  0.4 mg Oral Daily   Continuous Infusions:  sodium chloride Stopped (01/28/21 0504)   DAPTOmycin (CUBICIN)  IV 650 mg (02/08/21 2003)     LOS: 21 days   Signature  Lala Lund M.D on 02/09/2021 at 11:42 AM   -  To page go to [email or web address]

## 2021-02-10 ENCOUNTER — Inpatient Hospital Stay (HOSPITAL_COMMUNITY): Payer: Medicaid Other

## 2021-02-10 DIAGNOSIS — R7881 Bacteremia: Secondary | ICD-10-CM | POA: Diagnosis not present

## 2021-02-10 DIAGNOSIS — B9562 Methicillin resistant Staphylococcus aureus infection as the cause of diseases classified elsewhere: Secondary | ICD-10-CM | POA: Diagnosis not present

## 2021-02-10 LAB — BASIC METABOLIC PANEL
Anion gap: 8 (ref 5–15)
BUN: 23 mg/dL — ABNORMAL HIGH (ref 6–20)
CO2: 29 mmol/L (ref 22–32)
Calcium: 9.1 mg/dL (ref 8.9–10.3)
Chloride: 95 mmol/L — ABNORMAL LOW (ref 98–111)
Creatinine, Ser: 0.57 mg/dL (ref 0.44–1.00)
GFR, Estimated: >60 mL/min (ref >60)
Glucose, Bld: 100 mg/dL — ABNORMAL HIGH (ref 70–99)
Potassium: 4.2 mmol/L (ref 3.5–5.1)
Sodium: 132 mmol/L — ABNORMAL LOW (ref 135–145)

## 2021-02-10 LAB — C-REACTIVE PROTEIN: CRP: 2.1 mg/dL — ABNORMAL HIGH (ref <1.0)

## 2021-02-10 LAB — CBC
HCT: 25.8 % — ABNORMAL LOW (ref 36.0–46.0)
Hemoglobin: 7.5 g/dL — ABNORMAL LOW (ref 12.0–15.0)
MCH: 24.6 pg — ABNORMAL LOW (ref 26.0–34.0)
MCHC: 29.1 g/dL — ABNORMAL LOW (ref 30.0–36.0)
MCV: 84.6 fL (ref 80.0–100.0)
Platelets: 408 10*3/uL — ABNORMAL HIGH (ref 150–400)
RBC: 3.05 MIL/uL — ABNORMAL LOW (ref 3.87–5.11)
RDW: 19.9 % — ABNORMAL HIGH (ref 11.5–15.5)
WBC: 7.7 10*3/uL (ref 4.0–10.5)
nRBC: 0 % (ref 0.0–0.2)

## 2021-02-10 LAB — MAGNESIUM: Magnesium: 1.9 mg/dL (ref 1.7–2.4)

## 2021-02-10 MED ORDER — IOHEXOL 300 MG/ML  SOLN
100.0000 mL | Freq: Once | INTRAMUSCULAR | Status: AC | PRN
  Administered 2021-02-10: 100 mL via INTRAVENOUS

## 2021-02-10 NOTE — Progress Notes (Signed)
PROGRESS NOTE    Kelly [name]  YWV:[id number] DOB: [birth year] DOA: 01/19/2021 PCP: Pcp, No   Chief Complaint  Patient presents with   Abdominal Pain    Brief Narrative:  Kelly [name] is a 35 y.o. 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  Subjective: Patient in bed, appears comfortable, denies any headache, no fever, no chest pain or pressure, no shortness of breath , +ve suprapubic abdominal pain. No new focal weakness.     Objective: Vitals: Blood pressure 94/66, pulse 72, temperature 98.1 F (36.7 C), temperature source Oral, resp. rate 19, height 5' 7"  (1.702 m), weight 82 kg, SpO2 96 %, unknown if currently breastfeeding.   Exam:  Awake Alert, No new F.N deficits, Normal affect [place].AT,PERRAL Supple Neck,No JVD, No cervical lymphadenopathy appriciated.  Symmetrical Chest wall movement, Good air movement bilaterally, CTAB RRR,No Gallops, Rubs or new Murmurs, No Parasternal Heave +ve B.Sounds, Abd Soft, No tenderness, No organomegaly appriciated, No rebound - guarding or rigidity. No Cyanosis, Clubbing or edema, No new Rash or bruise      Mircobiology:  Blood culture on 9/8, 9/12 and 9/14: MRSA Blood culutre on 9/15: neg Pelvic abscess cx 9/15: MRSA  MRI pelvis on 9/13: Septic arthritis of pubic symphysis/osteomyelitis and surrounding abscesses Echo on 9/8: EF 85-46%, RV systolic function reduced.  RVSP 68.6 mmHg.  Severe TR.  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  Per ID-no benefit to repeating a echo/TEE-as it would not change management (patient not a candidate for surgery).  ID recommending daptomycin 6 weeks from 9/15 (PICC line in place)  Bacterial vaginosis: Completed a course of oral Flagyl, still has of suprapubic abdominal discomfort for a week check CT abdomen  Hyponatremia: SIADH - Euvolemic- PRN Lasix and fluid restriction.  Normocytic anemia: No overt blood loss-anemia due to critical/acute illness-follow-and transfuse if <7   Acute urinary retention: Foley was removed on 02/07/2021, passing urine,.  Continue Flomax.  Prolonged QTC: Resolved-stable QTC on 9/9 and 9/18.     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 difficile infection in the past  Constipation: Likely due to narcotics-improving-still with some hard stools-on scheduled MiraLAX/senna and Movantik.  Received numerous enemas over the past few days.  Continue to watch closely.  History of intravenous drug use (UDS on 9/9 positive for benzos, opiates, amphetamine and cocaine): Stable on methadone 100 mg daily (claims to be on 120 mg prior to this hospitalization) and IV Dilaudid for breakthrough pain.  On bowel regimen as noted above.  Patient aware that she will not be prescribed narcotics on discharge-and that she needs to contact her caseworker/outpatient methadone clinic for further narcotic needs.  She is agreeable with this plan.  She understands that when she gets closer to discharge-she will need to get in touch with her outpatient methadone clinic.    DVT prophylaxis: Lovenox Code Status: Full Family Communication: None at bedside Disposition:   Status is: Inpatient  Remains inpatient appropriate because:IV treatments appropriate due  to intensity of illness or inability to take PO  Dispo: The patient is from: Home               Anticipated d/c is to: Home              Patient currently is not medically stable to d/c.   Difficult to place patient Yes     Consultants:  ID Ortho IR   Data Reviewed: I have personally reviewed following labs and imaging studies  Recent Labs  Lab 02/04/21 0408 02/07/21 0413 02/10/21 0345  WBC 10.4 9.4 7.7  HGB 7.6* 7.7* 7.5*  HCT 25.7* 26.0* 25.8*  PLT 486* 461* 408*  MCV 84.8 83.1 84.6  MCH 25.1* 24.6* 24.6*  MCHC 29.6* 29.6* 29.1*  RDW 20.2* 20.0* 19.9*    Recent Labs  Lab 02/04/21 0408 02/07/21 0413 02/08/21 0500 02/10/21 0345  NA 131* 131* 132* 132*  K 4.6 4.3 4.2 4.2  CL 93* 93* 93* 95*  CO2 30 31 29 29   GLUCOSE 106* 100* 128* 100*  BUN 14 26* 29* 23*  CREATININE 0.56 0.59 0.58 0.57  CALCIUM 9.1 9.1 9.2 9.1  MG 1.8 1.9  --  1.9  CRP  --   --   --  2.1*    Radiology Studies: No results found.  Scheduled Meds:  Chlorhexidine Gluconate Cloth  6 each Topical Q0600   enoxaparin (LOVENOX) injection  40 mg Subcutaneous Q24H   furosemide  40 mg Oral Once   magnesium oxide  400 mg Oral Daily   methadone  100 mg Oral Daily   naloxegol oxalate  25 mg Oral QAC breakfast   polyethylene glycol  17 g Oral TID   senna-docusate  2 tablet Oral BID   sodium chloride flush  10-40 mL Intracatheter Q12H   sodium zirconium cyclosilicate  10 g Oral Daily   tamsulosin  0.4 mg Oral Daily   Continuous Infusions:  sodium chloride Stopped (01/28/21 0504)   DAPTOmycin (CUBICIN)  IV 650 mg (02/09/21 2012)     LOS: 22 days   Signature  Lala Lund M.D on 02/10/2021 at 11:23 AM   -  To page go to [email or web address]

## 2021-02-11 DIAGNOSIS — B9562 Methicillin resistant Staphylococcus aureus infection as the cause of diseases classified elsewhere: Secondary | ICD-10-CM | POA: Diagnosis not present

## 2021-02-11 DIAGNOSIS — R7881 Bacteremia: Secondary | ICD-10-CM | POA: Diagnosis not present

## 2021-02-11 LAB — BASIC METABOLIC PANEL
Anion gap: 8 (ref 5–15)
BUN: 23 mg/dL — ABNORMAL HIGH (ref 6–20)
CO2: 30 mmol/L (ref 22–32)
Calcium: 9.1 mg/dL (ref 8.9–10.3)
Chloride: 97 mmol/L — ABNORMAL LOW (ref 98–111)
Creatinine, Ser: 0.56 mg/dL (ref 0.44–1.00)
GFR, Estimated: >60 mL/min (ref >60)
Glucose, Bld: 92 mg/dL (ref 70–99)
Potassium: 4.2 mmol/L (ref 3.5–5.1)
Sodium: 135 mmol/L (ref 135–145)

## 2021-02-11 MED ORDER — HYDROMORPHONE HCL 1 MG/ML IJ SOLN
0.5000 mg | INTRAMUSCULAR | Status: DC | PRN
Start: 2021-02-11 — End: 2021-02-12
  Administered 2021-02-11 – 2021-02-12 (×4): 0.5 mg via INTRAVENOUS
  Filled 2021-02-11 (×4): qty 0.5

## 2021-02-11 NOTE — Progress Notes (Signed)
PROGRESS NOTE    Kelly [name]  VOH:[id number] DOB: Sep 19, 1985 DOA: 01/19/2021 PCP: Pcp, No   Chief Complaint  Patient presents with   Abdominal Pain    Brief Narrative:  Kelly [name] is a 35 y.o. 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   Subjective: Patient in bed, appears comfortable, denies any headache, no fever, no chest pain or pressure, no shortness of breath , no abdominal pain. No new focal weakness.    Objective: Vitals: Blood pressure 100/76, pulse 80, temperature 98.1 F (36.7 C), temperature source Oral, resp. rate 15, height 5' 7"  (1.702 m), weight 72.4 kg, SpO2 97 %, unknown if currently breastfeeding.   Exam:  Awake Alert, No new F.N deficits, Normal affect [place].AT,PERRAL Supple Neck,No JVD, No cervical lymphadenopathy appriciated.  Symmetrical Chest wall movement, Good air movement bilaterally, CTAB RRR,No Gallops, Rubs or new Murmurs, No Parasternal Heave +ve B.Sounds, Abd Soft, No tenderness, No organomegaly appriciated, No rebound - guarding or rigidity. No Cyanosis, Clubbing or edema, No new Rash or bruise     Mircobiology:  Blood culture on 9/8, 9/12 and 9/14: MRSA Blood culutre on 9/15: neg Pelvic abscess cx 9/15: MRSA  MRI pelvis on 9/13: Septic arthritis of pubic symphysis/osteomyelitis and surrounding abscesses Echo on 9/8: EF 69-48%, RV systolic function reduced.  RVSP 68.6 mmHg.  Severe TR.  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  Per ID-no benefit to repeating a echo/TEE-as it would not change management (patient not a candidate for surgery).  ID recommending daptomycin 6 weeks from 9/15 (PICC line in place)  Bacterial vaginosis: Completed a course of oral Flagyl, still has of suprapubic abdominal discomfort for a week check CT abdomen  Hyponatremia: SIADH - Euvolemic- PRN Lasix and fluid restriction.  Normocytic anemia: No overt blood loss-anemia due to critical/acute illness-follow-and transfuse if <7   Acute urinary retention: Foley was removed on 02/07/2021, passing urine,.  Continue Flomax.  Prolonged QTC: Resolved-stable QTC on 9/9 and 9/18.     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 difficile infection in the past  Constipation: Likely due to narcotics-improving-still with some hard stools-on scheduled MiraLAX/senna and Movantik.  Received numerous enemas over the past few days.  Continue to watch closely.  History of intravenous drug use (UDS on 9/9 positive for benzos, opiates, amphetamine and cocaine): Stable on methadone 100 mg daily (claims to be on 120 mg prior to this hospitalization) and IV Dilaudid for breakthrough pain.  On bowel regimen as noted above.  Patient aware that she will not be prescribed narcotics on discharge-and that she needs to contact her caseworker/outpatient methadone clinic for further narcotic needs.  She is agreeable with this plan.  She understands that when she gets closer to discharge-she will need to get in touch with her outpatient methadone clinic.    DVT prophylaxis: Lovenox Code Status: Full Family Communication: None at bedside Disposition:   Status is: Inpatient  Remains inpatient appropriate because:IV treatments appropriate due to intensity  of illness or inability to take PO  Dispo: The patient is from: Home              Anticipated d/c  is to: Home              Patient currently is not medically stable to d/c.   Difficult to place patient Yes     Consultants:  ID Ortho IR   Data Reviewed: I have personally reviewed following labs and imaging studies  Recent Labs  Lab 02/07/21 0413 02/10/21 0345  WBC 9.4 7.7  HGB 7.7* 7.5*  HCT 26.0* 25.8*  PLT 461* 408*  MCV 83.1 84.6  MCH 24.6* 24.6*  MCHC 29.6* 29.1*  RDW 20.0* 19.9*    Recent Labs  Lab 02/07/21 0413 02/08/21 0500 02/10/21 0345 02/11/21 0336  NA 131* 132* 132* 135  K 4.3 4.2 4.2 4.2  CL 93* 93* 95* 97*  CO2 31 29 29 30   GLUCOSE 100* 128* 100* 92  BUN 26* 29* 23* 23*  CREATININE 0.59 0.58 0.57 0.56  CALCIUM 9.1 9.2 9.1 9.1  MG 1.9  --  1.9  --   CRP  --   --  2.1*  --     Radiology Studies: CT ABDOMEN PELVIS W CONTRAST  Result Date: 02/10/2021 CLINICAL DATA:  Suprapubic abdominal pain for 3 days, septic arthritis of the pubic symphysis EXAM: CT ABDOMEN AND PELVIS WITH CONTRAST TECHNIQUE: Multidetector CT imaging of the abdomen and pelvis was performed using the standard protocol following bolus administration of intravenous contrast. CONTRAST:  164m OMNIPAQUE IOHEXOL 300 MG/ML  SOLN COMPARISON:  CT abdomen pelvis, 01/19/2021, MR pelvis, 01/24/2021 FINDINGS: Lower chest: No acute abnormality. Hepatobiliary: Enlarged, heterogeneously hypodense liver, maximum coronal span 24.1 cm. Status post cholecystectomy. No biliary ductal dilatation Pancreas: Unremarkable. No pancreatic ductal dilatation or surrounding inflammatory changes. Spleen: Normal in size without significant abnormality. Adrenals/Urinary Tract: Adrenal glands are unremarkable. Kidneys are normal, without renal calculi, solid lesion, or hydronephrosis. Bladder is unremarkable. Stomach/Bowel: Stomach is within normal limits. Appendix appears normal. No evidence of bowel wall thickening, distention, or inflammatory changes. Vascular/Lymphatic: No significant vascular findings are present. No  enlarged abdominal or pelvic lymph nodes. Reproductive: No mass or other significant abnormality. Other: No abdominal wall hernia or abnormality. No abdominopelvic ascites. Musculoskeletal: There is redemonstrated diastasis of the pubic symphysis (series 3, image 83) with a fluid collection about the joint space measuring approximately 5.2 x 3.4 cm, with an additional component about the anterior right aspect of the pubic symphysis measuring 1.6 x 1.0 cm (series 3, image 82). Findings are very similar to prior MR dated 01/24/2021. Partially imaged thoracolumbar rod fusion. IMPRESSION: 1. There is redemonstrated diastasis of the pubic symphysis with a fluid collection about the joint space measuring approximately 5.2 x 3.4 cm, with an additional component about the anterior right aspect of the pubic symphysis measuring 1.6 x 1.0 cm. Findings are very similar to prior MR dated 01/24/2021 and consistent with septic arthritis/osteomyelitis. 2. Hepatomegaly and hepatic steatosis. 3. Status post cholecystectomy. Electronically Signed   By: ADelanna AhmadiM.D.   On: 02/10/2021 14:09    Scheduled Meds:  Chlorhexidine Gluconate Cloth  6 each Topical Q0600   enoxaparin (LOVENOX) injection  40 mg Subcutaneous Q24H   furosemide  40 mg Oral Once   magnesium oxide  400 mg Oral Daily   methadone  100 mg Oral Daily   naloxegol oxalate  25 mg Oral QAC breakfast   polyethylene glycol  17 g Oral TID   senna-docusate  2 tablet Oral BID   sodium chloride flush  10-40 mL Intracatheter Q12H   sodium zirconium cyclosilicate  10 g Oral Daily   tamsulosin  0.4 mg Oral Daily   Continuous Infusions:  sodium chloride Stopped (01/28/21 0504)   DAPTOmycin (CUBICIN)  IV Stopped (02/10/21 2100)     LOS: 23 days   Signature  Lala Lund M.D on 02/11/2021 at 10:12 AM   -  To page go to [email or web address]

## 2021-02-11 NOTE — Plan of Care (Signed)
  Problem: Health Behavior/Discharge Planning: Goal: Ability to manage health-related needs will improve Outcome: Progressing   Problem: Clinical Measurements: Goal: Ability to maintain clinical measurements within normal limits will improve Outcome: Progressing Goal: Will remain free from infection Outcome: Progressing Goal: Diagnostic test results will improve Outcome: Progressing Goal: Cardiovascular complication will be avoided Outcome: Progressing   Problem: Activity: Goal: Risk for activity intolerance will decrease Outcome: Progressing   Problem: Coping: Goal: Level of anxiety will decrease Outcome: Progressing   Problem: Elimination: Goal: Will not experience complications related to bowel motility Outcome: Progressing Goal: Will not experience complications related to urinary retention Outcome: Progressing   Problem: Pain Managment: Goal: General experience of comfort will improve Outcome: Progressing   Problem: Safety: Goal: Ability to remain free from injury will improve Outcome: Progressing   Problem: Skin Integrity: Goal: Risk for impaired skin integrity will decrease Outcome: Progressing   Problem: Fluid Volume: Goal: Hemodynamic stability will improve Outcome: Progressing   Problem: Clinical Measurements: Goal: Diagnostic test results will improve Outcome: Progressing Goal: Signs and symptoms of infection will decrease Outcome: Progressing

## 2021-02-12 DIAGNOSIS — R7881 Bacteremia: Secondary | ICD-10-CM | POA: Diagnosis not present

## 2021-02-12 DIAGNOSIS — B9562 Methicillin resistant Staphylococcus aureus infection as the cause of diseases classified elsewhere: Secondary | ICD-10-CM | POA: Diagnosis not present

## 2021-02-12 MED ORDER — IBUPROFEN 400 MG PO TABS
400.0000 mg | ORAL_TABLET | Freq: Three times a day (TID) | ORAL | Status: DC | PRN
  Administered 2021-02-15 – 2021-02-18 (×4): 400 mg via ORAL
  Filled 2021-02-12 (×4): qty 1

## 2021-02-12 MED ORDER — HYDROCODONE-ACETAMINOPHEN 5-325 MG PO TABS
1.0000 | ORAL_TABLET | Freq: Three times a day (TID) | ORAL | Status: DC | PRN
  Administered 2021-02-12 – 2021-02-21 (×17): 1 via ORAL
  Filled 2021-02-12 (×20): qty 1

## 2021-02-12 MED ORDER — SODIUM CHLORIDE 0.9 % IV BOLUS
250.0000 mL | Freq: Once | INTRAVENOUS | Status: AC
  Administered 2021-02-12: 250 mL via INTRAVENOUS

## 2021-02-12 NOTE — Progress Notes (Signed)
Patient encouraged to get out of bed to chair but refused.  She stated it was uncomfortable for her to sit in the chair.

## 2021-02-12 NOTE — Progress Notes (Signed)
Patient displeased at change in pain management. States that new pain medication will not help and that she's allergic to this medication (itching). C/O of 7/10 pain to pelvis - patient is calm and cooperative, no distress noted at present. MD updated new orders received.

## 2021-02-12 NOTE — Progress Notes (Signed)
PROGRESS NOTE    Kelly [name]  KTG:[id number] DOB: 24-Feb-1986 DOA: 01/19/2021 PCP: Pcp, No   Chief Complaint  Patient presents with   Abdominal Pain    Brief Narrative:  Kelly [name] is a 35 y.o. 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   Subjective: Patient in bed sleeping, easily arousable, in no distress but complains of intense suprapubic pain at the site of her osteomyelitis and wants more narcotics, denies any nausea vomiting chest pain or shortness of breath    Objective: Vitals: Blood pressure 103/73, pulse 74, temperature 97.9 F (36.6 C), temperature source Oral, resp. rate 17, height 5' 7"  (1.702 m), weight 72.4 kg, SpO2 93 %, unknown if currently breastfeeding.   Exam:  She was sleeping, woken up, in no distress whatsoever no new F.N deficits, Normal affect [place].AT,PERRAL Supple Neck,No JVD, No cervical lymphadenopathy appriciated.  Symmetrical Chest wall movement, Good air movement bilaterally, CTAB RRR,No Gallops, Rubs or new Murmurs, No Parasternal Heave +ve B.Sounds, Abd Soft, No tenderness, No organomegaly appriciated, No rebound - guarding or rigidity. No Cyanosis, Clubbing or edema, No new Rash or bruise      Mircobiology:  Blood culture on 9/8, 9/12 and 9/14: MRSA Blood culutre on 9/15: neg Pelvic abscess cx 9/15: MRSA  MRI pelvis on 9/13: Septic arthritis of pubic symphysis/osteomyelitis and surrounding abscesses Echo on 9/8: EF 42-87%, RV systolic function reduced.  RVSP 68.6 mmHg.  Severe TR.  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  Per ID-no benefit to repeating a echo/TEE-as it would not change management (patient not a candidate for surgery).  ID recommending daptomycin 6 weeks from 9/15 (PICC line in place)  Bacterial vaginosis: Completed a course of oral Flagyl, still has of suprapubic abdominal discomfort for a week check CT abdomen  Hyponatremia: SIADH - Euvolemic- PRN Lasix and fluid restriction.  Normocytic anemia: No overt blood loss-anemia due to critical/acute illness-follow-and transfuse if <7   Acute urinary retention: Foley was removed on 02/07/2021, passing urine,.  Continue Flomax.  Prolonged QTC: Resolved-stable QTC on 9/9 and 9/18.     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 difficile infection in the past  Constipation: Likely due to narcotics-improving-still with some hard stools-on scheduled MiraLAX/senna and Movantik.  Received numerous enemas over the past few days.  Continue to watch closely.  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    She is on bowel regimen as noted above.  Patient aware that she will not be prescribed narcotics on discharge-and that she needs to contact her caseworker/outpatient methadone clinic for further narcotic needs.  She is agreeable with this plan.  She understands that when she gets closer to  discharge-she will need to get in touch with her outpatient methadone clinic.    DVT prophylaxis: Lovenox Code Status: Full Family Communication:  None at bedside Disposition:   Status is: Inpatient  Remains inpatient appropriate because:IV treatments appropriate due to intensity of illness or inability to take PO  Dispo: The patient is from: Home              Anticipated d/c is to: Home              Patient currently is not medically stable to d/c.   Difficult to place patient Yes     Consultants:  ID Ortho IR   Data Reviewed: I have personally reviewed following labs and imaging studies  Recent Labs  Lab 02/07/21 0413 02/10/21 0345  WBC 9.4 7.7  HGB 7.7* 7.5*  HCT 26.0* 25.8*  PLT 461* 408*  MCV 83.1 84.6  MCH 24.6* 24.6*  MCHC 29.6* 29.1*  RDW 20.0* 19.9*    Recent Labs  Lab 02/07/21 0413 02/08/21 0500 02/10/21 0345 02/11/21 0336  NA 131* 132* 132* 135  K 4.3 4.2 4.2 4.2  CL 93* 93* 95* 97*  CO2 31 29 29 30   GLUCOSE 100* 128* 100* 92  BUN 26* 29* 23* 23*  CREATININE 0.59 0.58 0.57 0.56  CALCIUM 9.1 9.2 9.1 9.1  MG 1.9  --  1.9  --   CRP  --   --  2.1*  --     Radiology Studies: CT ABDOMEN PELVIS W CONTRAST  Result Date: 02/10/2021 CLINICAL DATA:  Suprapubic abdominal pain for 3 days, septic arthritis of the pubic symphysis EXAM: CT ABDOMEN AND PELVIS WITH CONTRAST TECHNIQUE: Multidetector CT imaging of the abdomen and pelvis was performed using the standard protocol following bolus administration of intravenous contrast. CONTRAST:  153m OMNIPAQUE IOHEXOL 300 MG/ML  SOLN COMPARISON:  CT abdomen pelvis, 01/19/2021, MR pelvis, 01/24/2021 FINDINGS: Lower chest: No acute abnormality. Hepatobiliary: Enlarged, heterogeneously hypodense liver, maximum coronal span 24.1 cm. Status post cholecystectomy. No biliary ductal dilatation Pancreas: Unremarkable. No pancreatic ductal dilatation or surrounding inflammatory changes. Spleen: Normal in size without significant abnormality. Adrenals/Urinary Tract: Adrenal glands are unremarkable. Kidneys are normal, without renal calculi, solid lesion, or hydronephrosis.  Bladder is unremarkable. Stomach/Bowel: Stomach is within normal limits. Appendix appears normal. No evidence of bowel wall thickening, distention, or inflammatory changes. Vascular/Lymphatic: No significant vascular findings are present. No enlarged abdominal or pelvic lymph nodes. Reproductive: No mass or other significant abnormality. Other: No abdominal wall hernia or abnormality. No abdominopelvic ascites. Musculoskeletal: There is redemonstrated diastasis of the pubic symphysis (series 3, image 83) with a fluid collection about the joint space measuring approximately 5.2 x 3.4 cm, with an additional component about the anterior right aspect of the pubic symphysis measuring 1.6 x 1.0 cm (series 3, image 82). Findings are very similar to prior MR dated 01/24/2021. Partially imaged thoracolumbar rod fusion. IMPRESSION: 1. There is redemonstrated diastasis of the pubic symphysis with a fluid collection about the joint space measuring approximately 5.2 x 3.4 cm, with an additional component about the anterior right aspect of the pubic symphysis measuring 1.6 x 1.0 cm. Findings are very similar to prior MR dated 01/24/2021 and consistent with septic arthritis/osteomyelitis. 2. Hepatomegaly and hepatic steatosis. 3. Status post cholecystectomy. Electronically Signed   By: ADelanna AhmadiM.D.   On: 02/10/2021 14:09    Scheduled Meds:  Chlorhexidine Gluconate Cloth  6 each Topical Q0600   enoxaparin (LOVENOX) injection  40 mg  Subcutaneous Q24H   magnesium oxide  400 mg Oral Daily   methadone  100 mg Oral Daily   naloxegol oxalate  25 mg Oral QAC breakfast   polyethylene glycol  17 g Oral TID   senna-docusate  2 tablet Oral BID   sodium chloride flush  10-40 mL Intracatheter Q12H   sodium zirconium cyclosilicate  10 g Oral Daily   tamsulosin  0.4 mg Oral Daily   Continuous Infusions:  sodium chloride Stopped (01/28/21 0504)   DAPTOmycin (CUBICIN)  IV Stopped (02/11/21 2044)     LOS: 24 days    Signature  Lala Lund M.D on 02/12/2021 at 8:40 AM   -  To page go to [email or web address]

## 2021-02-13 DIAGNOSIS — R7881 Bacteremia: Secondary | ICD-10-CM | POA: Diagnosis not present

## 2021-02-13 DIAGNOSIS — B9562 Methicillin resistant Staphylococcus aureus infection as the cause of diseases classified elsewhere: Secondary | ICD-10-CM | POA: Diagnosis not present

## 2021-02-13 LAB — BASIC METABOLIC PANEL
Anion gap: 8 (ref 5–15)
BUN: 23 mg/dL — ABNORMAL HIGH (ref 6–20)
CO2: 29 mmol/L (ref 22–32)
Calcium: 9.1 mg/dL (ref 8.9–10.3)
Chloride: 96 mmol/L — ABNORMAL LOW (ref 98–111)
Creatinine, Ser: 0.54 mg/dL (ref 0.44–1.00)
GFR, Estimated: >60 mL/min (ref >60)
Glucose, Bld: 102 mg/dL — ABNORMAL HIGH (ref 70–99)
Potassium: 4.3 mmol/L (ref 3.5–5.1)
Sodium: 133 mmol/L — ABNORMAL LOW (ref 135–145)

## 2021-02-13 LAB — MAGNESIUM: Magnesium: 1.9 mg/dL (ref 1.7–2.4)

## 2021-02-13 LAB — CBC
HCT: 28.3 % — ABNORMAL LOW (ref 36.0–46.0)
Hemoglobin: 8.2 g/dL — ABNORMAL LOW (ref 12.0–15.0)
MCH: 24.6 pg — ABNORMAL LOW (ref 26.0–34.0)
MCHC: 29 g/dL — ABNORMAL LOW (ref 30.0–36.0)
MCV: 85 fL (ref 80.0–100.0)
Platelets: 428 10*3/uL — ABNORMAL HIGH (ref 150–400)
RBC: 3.33 MIL/uL — ABNORMAL LOW (ref 3.87–5.11)
RDW: 20.2 % — ABNORMAL HIGH (ref 11.5–15.5)
WBC: 7.5 10*3/uL (ref 4.0–10.5)
nRBC: 0 % (ref 0.0–0.2)

## 2021-02-13 NOTE — Progress Notes (Signed)
PROGRESS NOTE    Kelly [name]  WCH:[id number] DOB: Nov 13, 1985 DOA: 01/19/2021 PCP: Pcp, No   Chief Complaint  Patient presents with   Abdominal Pain    Brief Narrative:  Kelly [name] is a 35 y.o. 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   Mircobiology:  Blood culture on 9/8, 9/12 and 9/14: MRSA Blood culutre on 9/15: neg Pelvic abscess cx 9/15: MRSA  MRI pelvis on 9/13: Septic arthritis of pubic symphysis/osteomyelitis and surrounding abscesses Echo on 9/8: EF 35-36%, RV systolic function reduced.  RVSP 68.6 mmHg.  Severe TR.  Procedure:   9/15>>CT aspiration symphysis pubis fluid colletion     Subjective: Patient in bed, appears comfortable, denies any headache, no fever, no chest pain or pressure, no shortness of breath , no abdominal pain. No new focal weakness.   Objective: Vitals: Blood pressure 108/71, pulse 70, temperature 98 F (36.7 C), temperature source Oral, resp. rate 17, height 5' 7"  (1.702 m), weight 72.4 kg, SpO2 93 %, unknown if currently breastfeeding.   Exam:  Awake Alert, No new F.N deficits, in no distress [place].AT,PERRAL Supple Neck,No JVD, No cervical lymphadenopathy appriciated.  Symmetrical Chest wall movement, Good air movement bilaterally, CTAB RRR,No Gallops, Rubs or new Murmurs, No Parasternal Heave +ve B.Sounds, Abd Soft, No tenderness, No organomegaly appriciated, No rebound - guarding or rigidity. 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  Per ID-no benefit to repeating a echo/TEE-as it would not change management (patient not a candidate for surgery).  ID recommending daptomycin 6 weeks from 9/15 (PICC line in place)  Bacterial vaginosis: Completed a course of oral Flagyl.  Hyponatremia: SIADH - Euvolemic- PRN Lasix and fluid restriction.  Normocytic anemia: No overt blood loss-anemia due to critical/acute illness-follow-and transfuse if <7   Acute urinary retention: Foley was removed on 02/07/2021, passing urine,.  Continue Flomax.  Prolonged QTC: Resolved-stable QTC on 9/9 and 9/18.     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 difficile infection in the past  Constipation: Likely due to narcotics-improving-still with some hard stools-on scheduled MiraLAX/senna and Movantik.  Received numerous enemas over the past few days.  Continue to watch closely.  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    She is on bowel regimen as noted above.  Patient aware that she will not be prescribed narcotics on discharge-and that she needs to contact her caseworker/outpatient methadone clinic for further narcotic needs.  She is agreeable with this plan.  She understands that when she gets closer to discharge-she will need to get in touch with her outpatient methadone clinic.    DVT prophylaxis: Lovenox Code Status: Full Family Communication: None at  bedside Disposition:   Status is: Inpatient  Remains inpatient appropriate because:IV treatments appropriate due to intensity of illness or inability to  take PO  Dispo: The patient is from: Home              Anticipated d/c is to: Home              Patient currently is not medically stable to d/c.   Difficult to place patient Yes   Consultants:  ID Ortho IR   Data Reviewed: I have personally reviewed following labs and imaging studies  Recent Labs  Lab 02/07/21 0413 02/10/21 0345 02/13/21 0301  WBC 9.4 7.7 7.5  HGB 7.7* 7.5* 8.2*  HCT 26.0* 25.8* 28.3*  PLT 461* 408* 428*  MCV 83.1 84.6 85.0  MCH 24.6* 24.6* 24.6*  MCHC 29.6* 29.1* 29.0*  RDW 20.0* 19.9* 20.2*    Recent Labs  Lab 02/07/21 0413 02/08/21 0500 02/10/21 0345 02/11/21 0336 02/13/21 0301  NA 131* 132* 132* 135 133*  K 4.3 4.2 4.2 4.2 4.3  CL 93* 93* 95* 97* 96*  CO2 31 29 29 30 29   GLUCOSE 100* 128* 100* 92 102*  BUN 26* 29* 23* 23* 23*  CREATININE 0.59 0.58 0.57 0.56 0.54  CALCIUM 9.1 9.2 9.1 9.1 9.1  MG 1.9  --  1.9  --  1.9  CRP  --   --  2.1*  --   --     Radiology Studies: No results found.  Scheduled Meds:  Chlorhexidine Gluconate Cloth  6 each Topical Q0600   enoxaparin (LOVENOX) injection  40 mg Subcutaneous Q24H   magnesium oxide  400 mg Oral Daily   methadone  100 mg Oral Daily   naloxegol oxalate  25 mg Oral QAC breakfast   polyethylene glycol  17 g Oral TID   senna-docusate  2 tablet Oral BID   sodium chloride flush  10-40 mL Intracatheter Q12H   sodium zirconium cyclosilicate  10 g Oral Daily   tamsulosin  0.4 mg Oral Daily   Continuous Infusions:  sodium chloride Stopped (01/28/21 0504)   DAPTOmycin (CUBICIN)  IV Stopped (02/12/21 2100)     LOS: 25 days   Signature  Lala Lund M.D on 02/13/2021 at 10:42 AM   -  To page go to [email or web address]

## 2021-02-13 NOTE — Progress Notes (Signed)
Physical Therapy Treatment Patient Details Name: Kelly [name] MRN: [id number] DOB: 1986-03-03 Today's Date: 02/13/2021   History of Present Illness 35 y.o. female presents to [facility] hospital on 01/19/2021 with pelvic pain. Pt found to have sepsis 2/2 MRSA bacteremia. +pelvic abscesses with IR drainage 9/15; septic arthritis of pubic symphysis; pt will be in hospital x 6 weeks from 9/15 for antibiotics   PMH includes HTN, systolic CHF with moderate pulmonary hypertension and severe TR, polysubstance abuse including history of IV drug use, recurrent history of endocarditis, C. difficile colitis.    PT Comments    Pt motivated to participate in therapy. Mobility has greatly improved and RN agrees pt OK to be up in room with walker independently (RN will put telemetry box on pt to allow this). Pt now reports she has to go up 3 flights of steps to get to apartment.  She was only able to step up single step x 5 reps this date (became dizzy and required seated rest). Will update goals to reflect number of steps she needs to climb for discharge.    Recommendations for follow up therapy are one component of a multi-disciplinary discharge planning process, led by the attending physician.  Recommendations may be updated based on patient status, additional functional criteria and insurance authorization.  Follow Up Recommendations  No PT follow up     Equipment Recommendations  Rolling walker with 5" wheels;3in1 (PT)    Recommendations for Other Services       Precautions / Restrictions Precautions Precautions: None Restrictions Weight Bearing Restrictions: No     Mobility  Bed Mobility               General bed mobility comments: sitting at EOB finishing lunch    Transfers Overall transfer level: Needs assistance Equipment used: Rolling walker (2 wheeled) Transfers: Sit to/from Stand Sit to Stand: Modified independent (Device/Increase time)         General transfer comment:  bed elevated ~4 inches  Ambulation/Gait Ambulation/Gait assistance: Modified independent (Device/Increase time) Gait Distance (Feet): 60 Feet Assistive device: Rolling walker (2 wheeled) Gait Pattern/deviations: Step-to pattern;Decreased stride length Gait velocity: slow   General Gait Details: Slow, but incr velocity compared to previously. reliance on RW for pain control; reporting pain in left knee as bad as pelvic pain (reports she fell on her knee PTA)   Stairs Stairs: Yes Stairs assistance: Min assist Stair Management: One rail Left;Step to pattern;Forwards Number of Stairs: 5 General stair comments: single step into room (pt on isolation) and used elevated bed rail as rail for steps. pt able to ascend with RLE and descend with LLE as this is her weaker, more painful leg. Began to feel dizzy after 5 steps and took seated rest.   Wheelchair Mobility    Modified Rankin (Stroke Patients Only)       Balance Overall balance assessment: Needs assistance Sitting-balance support: Feet supported;Bilateral upper extremity supported Sitting balance-Leahy Scale: Fair Sitting balance - Comments: UE support for pain control Postural control: Posterior lean Standing balance support: No upper extremity supported Standing balance-Leahy Scale: Fair Standing balance comment: able to stand and brush teeth without UE assist and with minimal reaching                            Cognition Arousal/Alertness: Awake/alert Behavior During Therapy: WFL for tasks assessed/performed Overall Cognitive Status: Within Functional Limits for tasks assessed  Exercises General Exercises - Lower Extremity Hip Flexion/Marching: AROM;Both;5 reps;Standing (pt had done earlier today; 5 reps to demonstrate proper technique) Heel Raises: AROM;Both;10 reps;Standing Mini-Sqauts: AROM;Both;Other reps (comment);5 reps (pt had done earlier today;  5 reps to demonstrate proper technique) Other Exercises Other Exercises: pt with left knee pain limiting exercises this date. Pt reports she has independently been doing her supine exercises (heelslides, bridging, knee presses)    General Comments General comments (skin integrity, edema, etc.): Discussed with pt if she feels safe ambulating in room with RW (she does). Discussed with Rn placing pt on a telemetry box so she can be up in room I'ly and she agreed and plans to place one on pt.      Pertinent Vitals/Pain Pain Assessment: 0-10 Pain Score: 7  Pain Location: left knee and pelvis Pain Descriptors / Indicators: Guarding;Discomfort Pain Intervention(s): Limited activity within patient's tolerance;Monitored during session    Home Living                      Prior Function            PT Goals (current goals can now be found in the care plan section) Acute Rehab PT Goals Patient Stated Goal: to reduce pain PT Goal Formulation: With patient Time For Goal Achievement: 02/27/21 Potential to Achieve Goals: Fair Progress towards PT goals: Goals met and updated - see care plan    Frequency    Min 2X/week      PT Plan Discharge plan needs to be updated;Equipment recommendations need to be updated    Co-evaluation              AM-PAC PT "6 Clicks" Mobility   Outcome Measure  Help needed turning from your back to your side while in a flat bed without using bedrails?: None Help needed moving from lying on your back to sitting on the side of a flat bed without using bedrails?: None Help needed moving to and from a bed to a chair (including a wheelchair)?: None Help needed standing up from a chair using your arms (e.g., wheelchair or bedside chair)?: None Help needed to walk in hospital room?: None Help needed climbing 3-5 steps with a railing? : A Little 6 Click Score: 23    End of Session   Activity Tolerance: Patient limited by pain Patient left: with  call bell/phone within reach;in bed Nurse Communication: Mobility status;Other (comment) (can be up independently in room with tele box) PT Visit Diagnosis: Unsteadiness on feet (R26.81);Other abnormalities of gait and mobility (R26.89);Muscle weakness (generalized) (M62.81);Pain Pain - part of body:  (pelvis and LEs)     Time: 2725-3664 PT Time Calculation (min) (ACUTE ONLY): 17 min  Charges:  $Gait Training: 8-22 mins                      Arby Barrette, PT Pager [phone]    Rexanne Mano 02/13/2021, 1:33 PM

## 2021-02-14 DIAGNOSIS — B9562 Methicillin resistant Staphylococcus aureus infection as the cause of diseases classified elsewhere: Secondary | ICD-10-CM | POA: Diagnosis not present

## 2021-02-14 DIAGNOSIS — R7881 Bacteremia: Secondary | ICD-10-CM | POA: Diagnosis not present

## 2021-02-14 NOTE — Progress Notes (Signed)
PROGRESS NOTE    [name]  MVE:[id number] DOB: [birth year] DOA: 01/19/2021 PCP: Pcp, No   Chief Complaint  Patient presents with   Abdominal Pain    Brief Narrative:  Kelly [name] is a 35 y.o. 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   Mircobiology:  Blood culture on 9/8, 9/12 and 9/14: MRSA Blood culutre on 9/15: neg Pelvic abscess cx 9/15: MRSA  MRI pelvis on 9/13: Septic arthritis of pubic symphysis/osteomyelitis and surrounding abscesses Echo on 9/8: EF 28-36%, RV systolic function reduced.  RVSP 68.6 mmHg.  Severe TR.  Procedure:   9/15>>CT aspiration symphysis pubis fluid colletion     Subjective: Patient walking in the room with walker and appears to be in no distress, denies any headache chest or abdominal pain, no focal weakness.   Objective: Vitals: Blood pressure 107/66, pulse 72, temperature 98.1 F (36.7 C), temperature source Oral, resp. rate 20, height 5' 7"  (1.702 m), weight 72.4 kg, SpO2 (!) 88 %, unknown if currently breastfeeding.   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  Per ID-no benefit to repeating a echo/TEE-as it would not change management (patient not a candidate for surgery).  ID recommending daptomycin 6 weeks from 9/15 (PICC line in place)  Bacterial vaginosis: Completed a  course of oral Flagyl.  Hyponatremia: SIADH - Euvolemic- PRN Lasix and fluid restriction.  Normocytic anemia: No overt blood loss-anemia due to critical/acute illness-follow-and transfuse if <7   Acute urinary retention: Foley was removed on 02/07/2021, passing urine,.  Continue Flomax.  Prolonged QTC: Resolved-stable QTC on 9/9 and 9/18.     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 difficile infection in the past  Constipation: Likely due to narcotics-improving-still with some hard stools-on scheduled MiraLAX/senna and Movantik.  Received numerous enemas over the past few days.  Continue to watch closely.  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    She is on bowel regimen as noted above.  Patient aware that she will not be prescribed narcotics on discharge-and that she needs to contact her caseworker/outpatient methadone clinic for further narcotic needs.  She is agreeable with this plan.  She understands that when she gets closer to discharge-she will need to get in touch with her outpatient methadone clinic.    DVT prophylaxis: Lovenox Code Status: Full Family Communication: None at bedside Disposition:   Status is: Inpatient  Remains inpatient appropriate because:IV treatments appropriate due to intensity of illness or inability to take PO  Dispo: The patient is from: Home  Anticipated d/c is to: Home              Patient currently is not medically stable to d/c.   Difficult to place patient Yes   Consultants:  ID Ortho IR   Data Reviewed: I have personally reviewed following labs and imaging  studies  Recent Labs  Lab 02/10/21 0345 02/13/21 0301  WBC 7.7 7.5  HGB 7.5* 8.2*  HCT 25.8* 28.3*  PLT 408* 428*  MCV 84.6 85.0  MCH 24.6* 24.6*  MCHC 29.1* 29.0*  RDW 19.9* 20.2*    Recent Labs  Lab 02/08/21 0500 02/10/21 0345 02/11/21 0336 02/13/21 0301  NA 132* 132* 135 133*  K 4.2 4.2 4.2 4.3  CL 93* 95* 97* 96*  CO2 29 29 30 29   GLUCOSE 128* 100* 92 102*  BUN 29* 23* 23* 23*  CREATININE 0.58 0.57 0.56 0.54  CALCIUM 9.2 9.1 9.1 9.1  MG  --  1.9  --  1.9  CRP  --  2.1*  --   --     Radiology Studies: No results found.  Scheduled Meds:  Chlorhexidine Gluconate Cloth  6 each Topical Q0600   enoxaparin (LOVENOX) injection  40 mg Subcutaneous Q24H   magnesium oxide  400 mg Oral Daily   methadone  100 mg Oral Daily   naloxegol oxalate  25 mg Oral QAC breakfast   polyethylene glycol  17 g Oral TID   senna-docusate  2 tablet Oral BID   sodium chloride flush  10-40 mL Intracatheter Q12H   sodium zirconium cyclosilicate  10 g Oral Daily   tamsulosin  0.4 mg Oral Daily   Continuous Infusions:  sodium chloride Stopped (01/28/21 0504)   DAPTOmycin (CUBICIN)  IV Stopped (02/13/21 2130)     LOS: 26 days   Signature  Lala Lund M.D on 02/14/2021 at 11:19 AM   -  To page go to [email or web address]

## 2021-02-15 DIAGNOSIS — R7881 Bacteremia: Secondary | ICD-10-CM | POA: Diagnosis not present

## 2021-02-15 DIAGNOSIS — B9562 Methicillin resistant Staphylococcus aureus infection as the cause of diseases classified elsewhere: Secondary | ICD-10-CM | POA: Diagnosis not present

## 2021-02-15 LAB — CK: Total CK: 14 U/L — ABNORMAL LOW (ref 38–234)

## 2021-02-15 MED ORDER — POLYETHYLENE GLYCOL 3350 17 G PO PACK: 17.0000 g | PACK | Freq: Every day | ORAL | Status: DC | PRN

## 2021-02-15 NOTE — Progress Notes (Signed)
PROGRESS NOTE    Kelly [name]  YBO:[id number] DOB: [birth year] DOA: 01/19/2021 PCP: Pcp, No   Chief Complaint  Patient presents with   Abdominal Pain    Brief Narrative:  Kelly [name] is a 35 y.o. 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  Will need to remain in hospital through 10/27.   Mircobiology: Blood culture on 9/8, 9/12 and 9/14: MRSA Blood culutre on 9/15: neg Pelvic abscess cx 9/15: MRSA   Subjective: no current complaints   Objective: Vitals: Blood pressure 101/69, pulse 63, temperature 97.6 F (36.4 C), temperature source Oral, resp. rate 18, height 5' 7"  (1.702 m), weight 72.4 kg, SpO2 95 %, unknown if currently breastfeeding.   Exam:   General: Appearance:     Overweight female in no acute distress     Lungs:     respirations unlabored     MS:   All extremities are intact.    Neurologic:   Awake, alert, oriented x 3. No apparent focal neurological           defect.         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  Per ID-no benefit to repeating a echo/TEE-as it would not change management (patient not a candidate for surgery).  ID recommending daptomycin 6 weeks from 9/15 (PICC line in place)- end date of 10/27  Bacterial vaginosis: Completed a course of oral Flagyl.  Hyponatremia: SIADH - Euvolemic- PRN Lasix and fluid restriction. -lab check q 72 hours  Normocytic anemia: No overt blood loss-anemia due to critical/acute illness-follow-and transfuse  if <7   Acute urinary retention: Foley was removed on 02/07/2021, passing urine  Continue Flomax.  Prolonged QTC: Resolved-stable QTC on 9/9 and 9/18.     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 difficile infection in the past  Constipation: Likely due to narcotics-improving-still with some hard stools-on scheduled MiraLAX/senna and Movantik.  Received numerous enemas over the past few days.  Continue to watch closely.  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    She is on bowel regimen as noted above.  Patient aware that she will not be prescribed narcotics on discharge-and that she needs to contact her caseworker/outpatient methadone clinic for further narcotic needs.  She is agreeable with this plan.  She understands that when she gets closer to discharge-she will need to get in touch with her outpatient methadone clinic.    DVT prophylaxis: Lovenox Code Status: Full Family Communication: None at bedside Disposition:   Status is: Inpatient  Remains inpatient appropriate because:IV treatments appropriate due to intensity of illness or inability to take PO  Dispo: The patient is from: Home  Anticipated d/c is to: Home              Patient currently is not medically stable to d/c.-- 10/27   Difficult to place patient Yes   Consultants:  ID Ortho IR   Data Reviewed: I have personally reviewed following labs and imaging studies  Recent Labs  Lab 02/10/21 0345 02/13/21 0301  WBC 7.7 7.5  HGB 7.5* 8.2*  HCT 25.8* 28.3*  PLT 408* 428*  MCV 84.6 85.0  MCH 24.6* 24.6*  MCHC 29.1* 29.0*  RDW  19.9* 20.2*    Recent Labs  Lab 02/10/21 0345 02/11/21 0336 02/13/21 0301  NA 132* 135 133*  K 4.2 4.2 4.3  CL 95* 97* 96*  CO2 29 30 29   GLUCOSE 100* 92 102*  BUN 23* 23* 23*  CREATININE 0.57 0.56 0.54  CALCIUM 9.1 9.1 9.1  MG 1.9  --  1.9  CRP 2.1*  --   --     Radiology Studies: No results found.  Scheduled Meds:  Chlorhexidine Gluconate Cloth  6 each Topical Q0600   enoxaparin (LOVENOX) injection  40 mg Subcutaneous Q24H   magnesium oxide  400 mg Oral Daily   methadone  100 mg Oral Daily   naloxegol oxalate  25 mg Oral QAC breakfast   polyethylene glycol  17 g Oral TID   senna-docusate  2 tablet Oral BID   sodium chloride flush  10-40 mL Intracatheter Q12H   sodium zirconium cyclosilicate  10 g Oral Daily   tamsulosin  0.4 mg Oral Daily   Continuous Infusions:  sodium chloride Stopped (01/28/21 0504)   DAPTOmycin (CUBICIN)  IV Stopped (02/14/21 2109)     LOS: 27 days   Signature  Geradine Girt DO on 02/15/2021 at 12:26 PM   -  To page go to [email or web address]

## 2021-02-16 DIAGNOSIS — B9562 Methicillin resistant Staphylococcus aureus infection as the cause of diseases classified elsewhere: Secondary | ICD-10-CM | POA: Diagnosis not present

## 2021-02-16 DIAGNOSIS — R7881 Bacteremia: Secondary | ICD-10-CM | POA: Diagnosis not present

## 2021-02-16 LAB — CBC
HCT: 29.6 % — ABNORMAL LOW (ref 36.0–46.0)
Hemoglobin: 8.6 g/dL — ABNORMAL LOW (ref 12.0–15.0)
MCH: 24.5 pg — ABNORMAL LOW (ref 26.0–34.0)
MCHC: 29.1 g/dL — ABNORMAL LOW (ref 30.0–36.0)
MCV: 84.3 fL (ref 80.0–100.0)
Platelets: 433 10*3/uL — ABNORMAL HIGH (ref 150–400)
RBC: 3.51 MIL/uL — ABNORMAL LOW (ref 3.87–5.11)
RDW: 20.2 % — ABNORMAL HIGH (ref 11.5–15.5)
WBC: 6.6 10*3/uL (ref 4.0–10.5)
nRBC: 0 % (ref 0.0–0.2)

## 2021-02-16 LAB — BASIC METABOLIC PANEL
Anion gap: 7 (ref 5–15)
BUN: 24 mg/dL — ABNORMAL HIGH (ref 6–20)
CO2: 29 mmol/L (ref 22–32)
Calcium: 9.5 mg/dL (ref 8.9–10.3)
Chloride: 99 mmol/L (ref 98–111)
Creatinine, Ser: 0.63 mg/dL (ref 0.44–1.00)
GFR, Estimated: >60 mL/min (ref >60)
Glucose, Bld: 102 mg/dL — ABNORMAL HIGH (ref 70–99)
Potassium: 4.2 mmol/L (ref 3.5–5.1)
Sodium: 135 mmol/L (ref 135–145)

## 2021-02-16 LAB — MAGNESIUM: Magnesium: 1.9 mg/dL (ref 1.7–2.4)

## 2021-02-16 NOTE — TOC Progression Note (Signed)
Transition of Care [facility]) - Progression Note    Patient Details  Name: Kelly [name] MRN: [id number] Date of Birth: 06-Aug-1985  Transition of Care [name]) CM/SW [place], RN Phone Number: 02/16/2021, 9:34 AM  Clinical Narrative:     Patient  continues on IV antibiotic through 10/26 for endocarditis. She is aware that the MD will not prescribe narcotics and she needs to set up with her previous methadone clinic. PCP recommended in patient instructions  Expected Discharge Plan: Home/Self Care Barriers to Discharge: Continued Medical Work up  Expected Discharge Plan and Services Expected Discharge Plan: Home/Self Care In-house Referral: Clinical Social Work Discharge Planning Services: CM Consult   Living arrangements for the past 2 months: Hotel/Motel                                       Social Determinants of Health (SDOH) Interventions    Readmission Risk Interventions Readmission Risk Prevention Plan 08/25/2020 07/30/2018  Transportation Screening Complete Complete  Transportation Screening Comment Rooms at the [facility] to provide transportation for the patient. -  PCP or Specialist Appt within 5-7 Days Complete -  Not Complete comments 08/29/20 OB/GYN appointment scheduled -  Home Care Screening Complete -  Medication Review (RN Care Manager) - Complete  PCP or Specialist appointment within 3-5 days of discharge - Complete  HRI or [place] - Not Complete  HRI or Home Care Consult Pt Refusal Comments - N/A  SW Recovery Care/Counseling Consult - Complete  Palliative Care Screening - Not Complete  Comments - N/A  Skilled Nursing Facility - Not Complete  SNF Comments - N/A  Some recent data might be hidden

## 2021-02-16 NOTE — Progress Notes (Signed)
Physical Therapy Treatment Patient Details Name: Kelly [name] MRN: [id number] DOB: [birth year] Today's Date: 02/16/2021   History of Present Illness 35 y.o. female presents to [facility] hospital on 01/19/2021 with pelvic pain. Pt found to have sepsis 2/2 MRSA bacteremia. +pelvic abscesses with IR drainage 9/15; septic arthritis of pubic symphysis; pt will be in hospital x 6 weeks from 9/15 for antibiotics   PMH includes HTN, systolic CHF with moderate pulmonary hypertension and severe TR, polysubstance abuse including history of IV drug use, recurrent history of endocarditis, C. difficile colitis.    PT Comments    Patient continues to work on LE exercise program on her own. Exercises upgraded today for incr difficulty. Patient able to meet goal for steps today. Is walking modified independent in room with RW. Now asking about ability to use a cane. Simulated via HHA and pt was able to tolerate mild incr in pain compared to when using RW. Will plan to bring cane to next PT session. 3 of 4 goals now met.     Recommendations for follow up therapy are one component of a multi-disciplinary discharge planning process, led by the attending physician.  Recommendations may be updated based on patient status, additional functional criteria and insurance authorization.  Follow Up Recommendations  No PT follow up     Equipment Recommendations  Rolling walker with 5" wheels;3in1 (PT)    Recommendations for Other Services       Precautions / Restrictions Precautions Precautions: None Restrictions Weight Bearing Restrictions: No     Mobility  Bed Mobility Overal bed mobility: Needs Assistance Bed Mobility: Supine to Sit     Supine to sit: Independent Sit to supine: Independent        Transfers Overall transfer level: Needs assistance Equipment used: Rolling walker (2 wheeled);None Transfers: Sit to/from Stand Sit to Stand: Modified independent (Device/Increase time)         General  transfer comment: bed at lowest height  Ambulation/Gait Ambulation/Gait assistance: Modified independent (Device/Increase time);Min assist Gait Distance (Feet): 25 Feet (with RW; 30 ft simulated cane/HHA on left) Assistive device: Rolling walker (2 wheeled);1 person hand held assist (simulating use of cane) Gait Pattern/deviations: Step-to pattern;Decreased stride length Gait velocity: slow   General Gait Details: Decrease bil UE reliance on RW and wants to try to use a cane. Simulated with HHA and pt reported slightly increased pain compared to RW, but bearable.   Stairs   Stairs assistance: Min assist Stair Management: One rail Left;Step to pattern;Forwards Number of Stairs: 15 General stair comments: single step into room (pt on isolation) and used elevated bed rail as rail for steps. pt able to ascend with RLE and descend with LLE as this is her weaker, more painful leg.   Wheelchair Mobility    Modified Rankin (Stroke Patients Only)       Balance Overall balance assessment: Needs assistance Sitting-balance support: Feet supported;Bilateral upper extremity supported Sitting balance-Leahy Scale: Fair     Standing balance support: No upper extremity supported Standing balance-Leahy Scale: Fair Standing balance comment: able to stand and brush teeth without UE assist and with minimal reaching                            Cognition Arousal/Alertness: Awake/alert Behavior During Therapy: WFL for tasks assessed/performed Overall Cognitive Status: Within Functional Limits for tasks assessed  General Comments: Pt polite , agreeable to therapy, and trying to move and participate despite pain      Exercises General Exercises - Lower Extremity Heel Slides: Both;5 reps;Supine;AROM (Self AAROM for pain control) Straight Leg Raises: AROM;Both;5 reps Hip Flexion/Marching: AROM;Both;5 reps;Standing;Supine (both  positions) Mini-Sqauts: AROM;Both;Other reps (comment);5 reps (cues to minimize use of UEs on RW) Other Exercises Other Exercises: sit to stand with hands on knees (pt unable x 3 attempts); progressed to one hand on knee and one on bed Other Exercises: bridging x 5 reps; added 5 second hold and attempted marching in bridge position (pt could not clear feet off mattress, but able to tolerate wt-shifting to each foot)    General Comments        Pertinent Vitals/Pain Pain Assessment: 0-10 Faces Pain Scale: Hurts whole lot Pain Location: left knee and pelvis Pain Descriptors / Indicators: Guarding;Discomfort Pain Intervention(s): Limited activity within patient's tolerance;Monitored during session    Home Living                      Prior Function            PT Goals (current goals can now be found in the care plan section) Acute Rehab PT Goals Patient Stated Goal: to reduce pain PT Goal Formulation: With patient Time For Goal Achievement: 02/27/21 Potential to Achieve Goals: Fair Progress towards PT goals:  (3 of 4 goals met; will work towards ambulation with cane)    Frequency    Min 2X/week      PT Plan Current plan remains appropriate    Co-evaluation              AM-PAC PT "6 Clicks" Mobility   Outcome Measure  Help needed turning from your back to your side while in a flat bed without using bedrails?: None Help needed moving from lying on your back to sitting on the side of a flat bed without using bedrails?: None Help needed moving to and from a bed to a chair (including a wheelchair)?: None Help needed standing up from a chair using your arms (e.g., wheelchair or bedside chair)?: None Help needed to walk in hospital room?: None Help needed climbing 3-5 steps with a railing? : A Little 6 Click Score: 23    End of Session   Activity Tolerance: Patient tolerated treatment well Patient left: with call bell/phone within reach;in chair   PT  Visit Diagnosis: Unsteadiness on feet (R26.81);Other abnormalities of gait and mobility (R26.89);Muscle weakness (generalized) (M62.81);Pain Pain - part of body:  (pelvis and LEs)     Time: 9326-7124 PT Time Calculation (min) (ACUTE ONLY): 17 min  Charges:  $Gait Training: 8-22 mins                      Arby Barrette, PT Pager [phone]    Kelly [name] 02/16/2021, 11:28 AM

## 2021-02-16 NOTE — Progress Notes (Signed)
PROGRESS NOTE    Kelly [name]  RUE:[id number] DOB: 09-29-1985 DOA: 01/19/2021 PCP: Pcp, No   Chief Complaint  Patient presents with   Abdominal Pain    Brief Narrative:  Kelly [name] is a 35 y.o. 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  Will need to remain in hospital through 10/27.   Mircobiology: Blood culture on 9/8, 9/12 and 9/14: MRSA Blood culutre on 9/15: neg Pelvic abscess cx 9/15: MRSA   Subjective: no current complaints   Objective: Vitals: Blood pressure 90/65, pulse 84, temperature 98 F (36.7 C), temperature source Oral, resp. rate 16, height 5' 7"  (1.702 m), weight 72.4 kg, SpO2 93 %, unknown if currently breastfeeding.   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  Per ID-no benefit to repeating a echo/TEE-as it would not change management (patient not a candidate for surgery).  ID recommending daptomycin 6 weeks from 9/15 (PICC line in place)- end date of 10/27  Bacterial vaginosis: Completed a course of oral Flagyl.  Hyponatremia: SIADH  -Na normal on 10/6  Normocytic anemia: No overt blood loss-anemia due to critical/acute illness-follow-and transfuse if <7   Acute urinary retention: Foley was removed on 02/07/2021, passing urine  Continue Flomax.  Prolonged QTC: Resolved-stable QTC on 9/9 and 9/18.     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 difficile  infection in the past  Constipation: Likely due to narcotics-improving-still with some hard stools-on scheduled MiraLAX/senna and Movantik.  Received numerous enemas over the past few days.  Continue to watch closely.  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    She is on bowel regimen as noted above.  Patient aware that she will not be prescribed narcotics on discharge-and that she needs to contact her caseworker/outpatient methadone clinic for further narcotic needs.  She is agreeable with this plan.  She understands that when she gets closer to discharge-she will need to get in touch with her outpatient methadone clinic.    DVT prophylaxis: Lovenox Code Status: Full Family Communication: None at bedside Disposition:   Status is: Inpatient  Remains inpatient appropriate because:IV treatments appropriate due to intensity of illness or inability to take PO  Dispo: The patient is from: Home              Anticipated d/c is to: Home              Patient currently is not medically stable to d/c.-- 10/27   Difficult to place patient Yes   Consultants:  ID Ortho IR   Data Reviewed: I have personally reviewed following labs and imaging studies  Recent Labs  Lab 02/10/21 0345 02/13/21 0301 02/16/21 0349  WBC  7.7 7.5 6.6  HGB 7.5* 8.2* 8.6*  HCT 25.8* 28.3* 29.6*  PLT 408* 428* 433*  MCV 84.6 85.0 84.3  MCH 24.6* 24.6* 24.5*  MCHC 29.1* 29.0* 29.1*  RDW 19.9* 20.2* 20.2*    Recent Labs  Lab 02/10/21 0345 02/11/21 0336 02/13/21 0301 02/16/21 0349  NA 132* 135 133* 135  K 4.2 4.2 4.3 4.2  CL 95* 97* 96* 99  CO2 29 30 29 29   GLUCOSE 100* 92 102* 102*  BUN 23* 23* 23*  24*  CREATININE 0.57 0.56 0.54 0.63  CALCIUM 9.1 9.1 9.1 9.5  MG 1.9  --  1.9 1.9  CRP 2.1*  --   --   --     Radiology Studies: No results found.  Scheduled Meds:  Chlorhexidine Gluconate Cloth  6 each Topical Q0600   enoxaparin (LOVENOX) injection  40 mg Subcutaneous Q24H   magnesium oxide  400 mg Oral Daily   methadone  100 mg Oral Daily   naloxegol oxalate  25 mg Oral QAC breakfast   senna-docusate  2 tablet Oral BID   sodium chloride flush  10-40 mL Intracatheter Q12H   tamsulosin  0.4 mg Oral Daily   Continuous Infusions:  sodium chloride Stopped (01/28/21 0504)   DAPTOmycin (CUBICIN)  IV Stopped (02/15/21 2022)     LOS: 28 days   Signature  Geradine Girt DO on 02/16/2021 at 1:48 PM   -  To page go to [email or web address]

## 2021-02-17 DIAGNOSIS — B9562 Methicillin resistant Staphylococcus aureus infection as the cause of diseases classified elsewhere: Secondary | ICD-10-CM | POA: Diagnosis not present

## 2021-02-17 DIAGNOSIS — R7881 Bacteremia: Secondary | ICD-10-CM | POA: Diagnosis not present

## 2021-02-17 LAB — C-REACTIVE PROTEIN: CRP: 0.8 mg/dL (ref <1.0)

## 2021-02-17 NOTE — Progress Notes (Signed)
PROGRESS NOTE    Kelly [name]  RXY:[id number] DOB: April 02, 1986 DOA: 01/19/2021 PCP: Pcp, No   Chief Complaint  Patient presents with   Abdominal Pain    Brief Narrative:  Kelly [name] is a 35 y.o. 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  Will need to remain in hospital through 10/27.   Mircobiology: Blood culture on 9/8, 9/12 and 9/14: MRSA Blood culutre on 9/15: neg Pelvic abscess cx 9/15: MRSA   Subjective: no current complaints   Objective: Vitals: Blood pressure 98/68, pulse 64, temperature 98.2 F (36.8 C), temperature source Oral, resp. rate 17, height 5' 7"  (1.702 m), weight 72.4 kg, SpO2 96 %, unknown if currently breastfeeding.   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  Per ID-no benefit to repeating a echo/TEE-as it would not change management (patient not a candidate for surgery).  ID recommending daptomycin 6 weeks from 9/15 (PICC line in place)- end date of 10/27  Bacterial vaginosis: Completed a course of oral Flagyl.  Hyponatremia: SIADH  -Na normal on 10/6 -increased amount of fluid patient can have and recheck in a few days  Normocytic anemia: No overt blood loss-anemia due to critical/acute illness-follow-and transfuse if <7   Acute urinary retention: Foley was removed on 02/07/2021, passing urine  Continue Flomax.  Prolonged QTC: Resolved-stable QTC on 9/9 and 9/18.      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 difficile infection in the past  Constipation: Likely due to narcotics-improving-still with some hard stools-on scheduled MiraLAX/senna and Movantik.  Received numerous enemas over the past few days.  Continue to watch closely.  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    She is on bowel regimen as noted above.  Patient aware that she will not be prescribed narcotics on discharge-and that she needs to contact her caseworker/outpatient methadone clinic for further narcotic needs.  She is agreeable with this plan.  She understands that when she gets closer to discharge-she will need to get in touch with her outpatient methadone clinic.    DVT prophylaxis: Lovenox Code Status: Full Family Communication: None at bedside Disposition:   Status is: Inpatient  Remains inpatient appropriate because:IV treatments appropriate due to intensity of illness or inability to take PO  Dispo: The patient is from: Home              Anticipated d/c is to: Home              Patient currently is not medically stable to d/c.-- 10/27   Difficult to place patient Yes   Consultants:  ID Ortho IR   Data Reviewed: I  have personally reviewed following labs and imaging studies  Recent Labs  Lab 02/13/21 0301 02/16/21 0349  WBC 7.5 6.6  HGB 8.2* 8.6*  HCT 28.3* 29.6*  PLT 428* 433*  MCV 85.0 84.3  MCH 24.6* 24.5*  MCHC 29.0* 29.1*  RDW 20.2* 20.2*    Recent Labs  Lab 02/11/21 0336 02/13/21 0301 02/16/21 0349 02/17/21 0500  NA 135 133* 135  --   K 4.2 4.3 4.2  --   CL 97* 96* 99  --    CO2 30 29 29   --   GLUCOSE 92 102* 102*  --   BUN 23* 23* 24*  --   CREATININE 0.56 0.54 0.63  --   CALCIUM 9.1 9.1 9.5  --   MG  --  1.9 1.9  --   CRP  --   --   --  0.8    Radiology Studies: No results found.  Scheduled Meds:  Chlorhexidine Gluconate Cloth  6 each Topical Q0600   enoxaparin (LOVENOX) injection  40 mg Subcutaneous Q24H   magnesium oxide  400 mg Oral Daily   methadone  100 mg Oral Daily   naloxegol oxalate  25 mg Oral QAC breakfast   senna-docusate  2 tablet Oral BID   sodium chloride flush  10-40 mL Intracatheter Q12H   tamsulosin  0.4 mg Oral Daily   Continuous Infusions:  sodium chloride Stopped (01/28/21 0504)   DAPTOmycin (CUBICIN)  IV Stopped (02/16/21 2119)     LOS: 29 days   Signature  Geradine Girt DO on 02/17/2021 at 8:55 AM   -  To page go to [email or web address]

## 2021-02-17 NOTE — Progress Notes (Signed)
    [facility]    Date of Admission:  01/19/2021   Total days of antibiotics 22           ID: Kelly [name] is a 35 y.o. 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 Noticing bony promininence growth at based/near xyphoid on chest wall  Medications:   Chlorhexidine Gluconate Cloth  6 each Topical Q0600   enoxaparin (LOVENOX) injection  40 mg Subcutaneous Q24H   magnesium oxide  400 mg Oral Daily   methadone  100 mg Oral Daily   naloxegol oxalate  25 mg Oral QAC breakfast   senna-docusate  2 tablet Oral BID   sodium chloride flush  10-40 mL Intracatheter Q12H   tamsulosin  0.4 mg Oral Daily    Objective: Vital signs in last 24 hours: Temp:  [97.6 F (36.4 C)-98.4 F (36.9 C)] 97.7 F (36.5 C) (10/07 1233) Pulse Rate:  [61-76] 76 (10/07 1233) Resp:  [15-17] 17 (10/07 0353) BP: (91-104)/(59-72) 91/62 (10/07 1233) SpO2:  [96 %-97 %] 96 % (10/07 0353)  Physical Exam  Constitutional:  oriented to person, place, and time. appears well-developed and well-nourished. No distress.  HENT: /AT, PERRLA, no scleral icterus Mouth/Throat: Oropharynx is clear and moist. No oropharyngeal exudate. Poor dentition Cardiovascular: Normal rate, regular rhythm and normal heart sounds. Exam reveals no gallop and no friction rub.  No murmur heard.  Chest wall: tenderness near xyphoid process Pulmonary/Chest: Effort normal and breath sounds normal. No respiratory distress.  has no wheezes.  Neck = supple, no nuchal rigidity Abdominal:  Soft. Bowel sounds are normal.  exhibits no distension. There is no tenderness.  Lymphadenopathy: no cervical adenopathy. No axillary adenopathy Neurological: alert and oriented to person, place, and time.  Skin: Skin is warm and dry. No rash noted. No erythema.  Psychiatric: a normal mood and affect.  behavior is normal.    Lab Results Recent Labs    02/16/21 0349  WBC 6.6  HGB 8.6*  HCT 29.6*  NA 135  K 4.2  CL 99  CO2 29  BUN 24*  CREATININE 0.63   Liver Panel No results for input(s): PROT, ALBUMIN, AST, ALT, ALKPHOS, BILITOT, BILIDIR, IBILI in the last 72 hours. Sedimentation Rate No results for input(s): ESRSEDRATE in the last 72 hours. C-Reactive Protein Recent Labs    02/17/21 0500  CRP 0.8    Microbiology: reviewed Studies/Results: No results found.   Assessment/Plan: MRSA bacteremia/TV endocarditis/pelvic osteomyelitis = continue on IV daptomycin. Currently on day 22. Consider to change to orals/long-acting at day 28  vs 42 with follow up in ID clinic  Tender/growth on chest wall = concern for abscess/ will get chest ct with contrast to evaluate chest well  TDM = will check weekly CK to see ensure tolerating daptomycin.    [facility] Pager: [phone]  02/17/2021, 3:37 PM

## 2021-02-18 ENCOUNTER — Encounter: Payer: Self-pay | Admitting: Radiology

## 2021-02-18 ENCOUNTER — Encounter (HOSPITAL_COMMUNITY): Payer: Medicaid Other

## 2021-02-18 ENCOUNTER — Inpatient Hospital Stay (HOSPITAL_COMMUNITY): Payer: Medicaid Other

## 2021-02-18 DIAGNOSIS — R7881 Bacteremia: Secondary | ICD-10-CM | POA: Diagnosis not present

## 2021-02-18 DIAGNOSIS — B9562 Methicillin resistant Staphylococcus aureus infection as the cause of diseases classified elsewhere: Secondary | ICD-10-CM | POA: Diagnosis not present

## 2021-02-18 LAB — D-DIMER, QUANTITATIVE: D-Dimer, Quant: 1.04 ug/mL-FEU — ABNORMAL HIGH (ref 0.00–0.50)

## 2021-02-18 MED ORDER — IOHEXOL 350 MG/ML SOLN
100.0000 mL | Freq: Once | INTRAVENOUS | Status: AC | PRN
  Administered 2021-02-18: 100 mL via INTRAVENOUS

## 2021-02-18 MED ORDER — APIXABAN 5 MG PO TABS
10.0000 mg | ORAL_TABLET | Freq: Two times a day (BID) | ORAL | Status: DC
  Administered 2021-02-18 – 2021-02-22 (×9): 10 mg via ORAL
  Filled 2021-02-18 (×9): qty 2

## 2021-02-18 MED ORDER — APIXABAN 5 MG PO TABS: 5.0000 mg | ORAL_TABLET | Freq: Two times a day (BID) | ORAL | Status: DC

## 2021-02-18 NOTE — Progress Notes (Signed)
PROGRESS NOTE    Kelly [name]  KZL:[id number] DOB: 1985-10-25 DOA: 01/19/2021 PCP: Pcp, No   Chief Complaint  Patient presents with   Abdominal Pain    Brief Narrative:  Kelly [name] is a 35 y.o. 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  Will need to remain in hospital through 10/27.   Mircobiology: Blood culture on 9/8, 9/12 and 9/14: MRSA Blood culutre on 9/15: neg Pelvic abscess cx 9/15: MRSA   Subjective: CT Scan shows Occlusive embolus in segmental and subsegmental size pulmonary artery branches in the right lower lobe   Objective: Vitals: Blood pressure 96/66, pulse 61, temperature 98.1 F (36.7 C), temperature source Oral, resp. rate 20, height 5' 7"  (1.702 m), weight 72.4 kg, SpO2 95 %, unknown if currently breastfeeding.   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  Per ID-no benefit to repeating a echo/TEE-as it would not change management (patient not a candidate for surgery).  ID recommending daptomycin 6 weeks from 9/15 (PICC line in place)- end date of 10/27  ?PE -check d dimer -duplex LE -start eliquis for now  Bacterial vaginosis: Completed a course of oral Flagyl.  Hyponatremia: SIADH  -Na normal on 10/6 -increased amount of fluid patient can have and recheck in a few days  Normocytic anemia: No overt blood loss-anemia due to critical/acute illness-follow-and transfuse  if <7   Acute urinary retention: Foley was removed on 02/07/2021, passing urine  Continue Flomax.  Prolonged QTC: Resolved-stable QTC on 9/9 and 9/18.     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 difficile infection in the past  Constipation: Likely due to narcotics-improving-still with some hard stools-on scheduled MiraLAX/senna and Movantik.  Received numerous enemas over the past few days.  Continue to watch closely.  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    She is on bowel regimen as noted above.  Patient aware that she will not be prescribed narcotics on discharge-and that she needs to contact her caseworker/outpatient methadone clinic for further narcotic needs.  She is agreeable with this plan.  She understands that when she gets closer to discharge-she will need to get in touch with her outpatient methadone clinic.    DVT prophylaxis: Lovenox Code Status: Full Family Communication: None at bedside Disposition:   Status is: Inpatient  Remains inpatient appropriate because:IV treatments appropriate due to intensity of illness or inability to take PO  Dispo: The patient is from: Home              Anticipated d/c is to: Home              Patient currently  is not medically stable to d/c.-- 10/27   Difficult to place patient Yes   Consultants:  ID Ortho IR   Data Reviewed: I have personally reviewed following labs and imaging studies  Recent Labs  Lab 02/13/21 0301 02/16/21 0349  WBC 7.5 6.6  HGB 8.2* 8.6*  HCT 28.3* 29.6*  PLT 428* 433*  MCV 85.0 84.3  MCH 24.6* 24.5*  MCHC 29.0* 29.1*  RDW  20.2* 20.2*    Recent Labs  Lab 02/13/21 0301 02/16/21 0349 02/17/21 0500  NA 133* 135  --   K 4.3 4.2  --   CL 96* 99  --   CO2 29 29  --   GLUCOSE 102* 102*  --   BUN 23* 24*  --   CREATININE 0.54 0.63  --   CALCIUM 9.1 9.5  --   MG 1.9 1.9  --   CRP  --   --  0.8    Radiology Studies: CT CHEST W CONTRAST  Result Date: 02/18/2021 CLINICAL DATA:  35 year old female with history of lung abscess. EXAM: CT CHEST WITH CONTRAST TECHNIQUE: Multidetector CT imaging of the chest was performed during intravenous contrast administration. CONTRAST:  153m OMNIPAQUE IOHEXOL 350 MG/ML SOLN COMPARISON:  Chest CT December 02, 2017. FINDINGS: Cardiovascular: Heart size is enlarged with right ventricular and right atrial dilatation. There is no significant pericardial fluid, thickening or pericardial calcification. Occlusive filling defects in segmental and subsegmental sized branches in the right lower lobe best appreciated on axial images 80-84 of series 3. No atherosclerotic calcifications are noted in the thoracic aorta or the coronary arteries. Right upper extremity PICC with tip terminating in the distal superior vena cava. Mediastinum/Nodes: No pathologically enlarged mediastinal or hilar lymph nodes. At several prominent borderline enlarged mediastinal lymph nodes are incidentally noted, nonspecific and likely reactive. Esophagus is unremarkable in appearance. No axillary lymphadenopathy. Lungs/Pleura: Widespread areas of nodularity and architectural distortion are again noted throughout the lungs bilaterally, some of which are cavitary (example in the right upper lobe on axial image 45 of series 4). No confluent consolidative airspace disease. No pleural effusions. Widespread areas of septal thickening. Upper Abdomen: Liver is incompletely imaged, but clearly enlarged. Reflux of contrast material into the inferior vena cava and hepatic veins. Pneumobilia, suggesting prior sphincterotomy. Musculoskeletal:  Extensive orthopedic fixation hardware throughout the visualized thoracolumbar spine. There are no aggressive appearing lytic or blastic lesions noted in the visualized portions of the skeleton. IMPRESSION: 1. Widespread nodular architectural distortion and septal thickening scattered throughout the lungs bilaterally. Findings likely reflect sequela of recurrent septic embolization. Which of these areas represents acute septic emboli versus chronic scarring is uncertain. 2. Occlusive embolus in segmental and subsegmental size pulmonary artery branches in the right lower lobe, as above. 3. Cardiomegaly with severe right ventricular and right atrial dilatation. 4. Passive hepatic congestion with severe hepatomegaly. These results will be called to the ordering clinician or representative by the Radiologist Assistant, and communication documented in the PACS or [employer] Electronically Signed   By: DVinnie LangtonM.D.   On: 02/18/2021 10:59    Scheduled Meds:  apixaban  10 mg Oral BID   Followed by   [Derrill MemoON 02/25/2021] apixaban  5 mg Oral BID   Chlorhexidine Gluconate Cloth  6 each Topical Q0600   magnesium oxide  400 mg Oral Daily   methadone  100 mg Oral Daily   naloxegol oxalate  25 mg Oral QAC breakfast   senna-docusate  2 tablet Oral BID  sodium chloride flush  10-40 mL Intracatheter Q12H   tamsulosin  0.4 mg Oral Daily   Continuous Infusions:  sodium chloride Stopped (01/28/21 0504)   DAPTOmycin (CUBICIN)  IV 650 mg (02/17/21 2112)     LOS: 30 days   Signature  Geradine Girt DO on 02/18/2021 at 12:54 PM   -  To page go to [email or web address]

## 2021-02-19 ENCOUNTER — Inpatient Hospital Stay (HOSPITAL_COMMUNITY): Payer: Medicaid Other

## 2021-02-19 DIAGNOSIS — I2699 Other pulmonary embolism without acute cor pulmonale: Secondary | ICD-10-CM

## 2021-02-19 DIAGNOSIS — M79604 Pain in right leg: Secondary | ICD-10-CM

## 2021-02-19 DIAGNOSIS — B9562 Methicillin resistant Staphylococcus aureus infection as the cause of diseases classified elsewhere: Secondary | ICD-10-CM | POA: Diagnosis not present

## 2021-02-19 DIAGNOSIS — R7881 Bacteremia: Secondary | ICD-10-CM | POA: Diagnosis not present

## 2021-02-19 DIAGNOSIS — M79605 Pain in left leg: Secondary | ICD-10-CM

## 2021-02-19 LAB — BASIC METABOLIC PANEL
Anion gap: 8 (ref 5–15)
BUN: 25 mg/dL — ABNORMAL HIGH (ref 6–20)
CO2: 29 mmol/L (ref 22–32)
Calcium: 9.4 mg/dL (ref 8.9–10.3)
Chloride: 96 mmol/L — ABNORMAL LOW (ref 98–111)
Creatinine, Ser: 0.6 mg/dL (ref 0.44–1.00)
GFR, Estimated: >60 mL/min (ref >60)
Glucose, Bld: 122 mg/dL — ABNORMAL HIGH (ref 70–99)
Potassium: 4.2 mmol/L (ref 3.5–5.1)
Sodium: 133 mmol/L — ABNORMAL LOW (ref 135–145)

## 2021-02-19 LAB — CBC
HCT: 30.6 % — ABNORMAL LOW (ref 36.0–46.0)
Hemoglobin: 8.9 g/dL — ABNORMAL LOW (ref 12.0–15.0)
MCH: 24.5 pg — ABNORMAL LOW (ref 26.0–34.0)
MCHC: 29.1 g/dL — ABNORMAL LOW (ref 30.0–36.0)
MCV: 84.1 fL (ref 80.0–100.0)
Platelets: 399 10*3/uL (ref 150–400)
RBC: 3.64 MIL/uL — ABNORMAL LOW (ref 3.87–5.11)
RDW: 19.7 % — ABNORMAL HIGH (ref 11.5–15.5)
WBC: 6.5 10*3/uL (ref 4.0–10.5)
nRBC: 0 % (ref 0.0–0.2)

## 2021-02-19 LAB — MAGNESIUM: Magnesium: 1.7 mg/dL (ref 1.7–2.4)

## 2021-02-19 MED ORDER — DICLOFENAC SODIUM 1 % EX GEL
2.0000 g | Freq: Four times a day (QID) | CUTANEOUS | Status: DC
  Administered 2021-02-19 – 2021-02-22 (×6): 2 g via TOPICAL
  Filled 2021-02-19: qty 100

## 2021-02-19 NOTE — Progress Notes (Signed)
VASCULAR LAB    Bilateral lower extremity venous duplex has been performed.  See CV proc for preliminary results.   Mariangela Heldt, RVT 02/19/2021, 4:34 PM

## 2021-02-19 NOTE — Progress Notes (Signed)
PROGRESS NOTE    Kelly [name]  [id number]:[id number] DOB: [birth year] DOA: 01/19/2021 PCP: Pcp, No   Chief Complaint  Patient presents with   Abdominal Pain    Brief Narrative:  Kelly [name] is a 35 y.o. 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  Will need to remain in hospital through 10/27.   Mircobiology: Blood culture on 9/8, 9/12 and 9/14: MRSA Blood culutre on 9/15: neg Pelvic abscess cx 9/15: MRSA   Subjective:  C/o left knee pain   Objective: Vitals: Blood pressure 91/67, pulse 80, temperature 98.3 F (36.8 C), temperature source Oral, resp. rate 18, height 5' 7"  (1.702 m), weight 72.4 kg, SpO2 95 %, unknown if currently breastfeeding.   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  Per ID-no benefit to repeating a echo/TEE-as it would not change management (patient not a candidate for surgery).  ID recommending daptomycin 6 weeks from 9/15 (PICC line in place)- end date of 10/27  PE -check d dimer -duplex LE pending still -start eliquis for now  Bacterial vaginosis: Completed a course of oral Flagyl.  Hyponatremia: SIADH  -monitor on fluid restrictions  Normocytic anemia: No overt blood loss-anemia due to critical/acute illness-follow-and transfuse if <7   Acute urinary retention: Foley was removed on 02/07/2021, passing urine  Continue Flomax.  Prolonged QTC: Resolved-stable QTC on 9/9 and 9/18.     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 difficile infection in the past  Constipation: Likely due to narcotics-improving-still with some hard stools-on scheduled MiraLAX/senna and Movantik.  Received numerous enemas over the past few days.  Continue to watch closely.  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    She is on bowel regimen as noted above.  Patient aware that she will not be prescribed narcotics on discharge-and that she needs to contact her caseworker/outpatient methadone clinic for further narcotic needs.  She is agreeable with this plan.  She understands that when she gets closer to discharge-she will need to get in touch with her outpatient methadone clinic.    DVT prophylaxis: Lovenox Code Status: Full Family Communication: None at bedside Disposition:   Status is: Inpatient  Remains inpatient appropriate because:IV treatments appropriate due to intensity of illness or inability to take PO  Dispo: The patient is from: Home              Anticipated d/c is to: Home              Patient currently is not medically stable to d/c.-- 10/27   Difficult to place patient Yes   Consultants:  ID Ortho IR   Data Reviewed: I have personally reviewed following labs  and imaging studies  Recent Labs  Lab 02/13/21 0301 02/16/21 0349 02/19/21 0529  WBC 7.5 6.6 6.5  HGB 8.2* 8.6* 8.9*  HCT 28.3* 29.6* 30.6*  PLT 428* 433* 399  MCV 85.0 84.3 84.1  MCH 24.6* 24.5* 24.5*  MCHC 29.0* 29.1* 29.1*  RDW 20.2* 20.2* 19.7*    Recent Labs  Lab 02/13/21 0301 02/16/21 0349 02/17/21 0500 02/18/21 1317 02/19/21 0529  NA 133* 135  --   --   133*  K 4.3 4.2  --   --  4.2  CL 96* 99  --   --  96*  CO2 29 29  --   --  29  GLUCOSE 102* 102*  --   --  122*  BUN 23* 24*  --   --  25*  CREATININE 0.54 0.63  --   --  0.60  CALCIUM 9.1 9.5  --   --  9.4  MG 1.9 1.9  --   --  1.7  CRP  --   --  0.8  --   --   DDIMER  --   --   --  1.04*  --     Radiology Studies: CT CHEST W CONTRAST  Result Date: 02/18/2021 CLINICAL DATA:  35 year old female with history of lung abscess. EXAM: CT CHEST WITH CONTRAST TECHNIQUE: Multidetector CT imaging of the chest was performed during intravenous contrast administration. CONTRAST:  184m OMNIPAQUE IOHEXOL 350 MG/ML SOLN COMPARISON:  Chest CT December 02, 2017. FINDINGS: Cardiovascular: Heart size is enlarged with right ventricular and right atrial dilatation. There is no significant pericardial fluid, thickening or pericardial calcification. Occlusive filling defects in segmental and subsegmental sized branches in the right lower lobe best appreciated on axial images 80-84 of series 3. No atherosclerotic calcifications are noted in the thoracic aorta or the coronary arteries. Right upper extremity PICC with tip terminating in the distal superior vena cava. Mediastinum/Nodes: No pathologically enlarged mediastinal or hilar lymph nodes. At several prominent borderline enlarged mediastinal lymph nodes are incidentally noted, nonspecific and likely reactive. Esophagus is unremarkable in appearance. No axillary lymphadenopathy. Lungs/Pleura: Widespread areas of nodularity and architectural distortion are again noted throughout the lungs bilaterally, some of which are cavitary (example in the right upper lobe on axial image 45 of series 4). No confluent consolidative airspace disease. No pleural effusions. Widespread areas of septal thickening. Upper Abdomen: Liver is incompletely imaged, but clearly enlarged. Reflux of contrast material into the inferior vena cava and hepatic veins. Pneumobilia, suggesting prior  sphincterotomy. Musculoskeletal: Extensive orthopedic fixation hardware throughout the visualized thoracolumbar spine. There are no aggressive appearing lytic or blastic lesions noted in the visualized portions of the skeleton. IMPRESSION: 1. Widespread nodular architectural distortion and septal thickening scattered throughout the lungs bilaterally. Findings likely reflect sequela of recurrent septic embolization. Which of these areas represents acute septic emboli versus chronic scarring is uncertain. 2. Occlusive embolus in segmental and subsegmental size pulmonary artery branches in the right lower lobe, as above. 3. Cardiomegaly with severe right ventricular and right atrial dilatation. 4. Passive hepatic congestion with severe hepatomegaly. These results will be called to the ordering clinician or representative by the Radiologist Assistant, and communication documented in the PACS or [employer] Electronically Signed   By: DVinnie LangtonM.D.   On: 02/18/2021 10:59    Scheduled Meds:  apixaban  10 mg Oral BID   Followed by   [Derrill MemoON 02/25/2021] apixaban  5 mg Oral BID   Chlorhexidine Gluconate Cloth  6 each Topical Q0600   diclofenac Sodium  2 g Topical QID   magnesium oxide  400 mg Oral Daily   methadone  100 mg Oral Daily   naloxegol oxalate  25 mg Oral QAC breakfast   senna-docusate  2 tablet Oral BID   sodium chloride flush  10-40 mL Intracatheter Q12H   tamsulosin  0.4 mg Oral Daily   Continuous Infusions:  sodium chloride Stopped (02/18/21 2248)   DAPTOmycin (CUBICIN)  IV Stopped (02/18/21 2132)     LOS: 31 days   Signature  Geradine Girt DO on 02/19/2021 at 12:36 PM   -  To page go to [email or web address]

## 2021-02-20 ENCOUNTER — Other Ambulatory Visit (HOSPITAL_COMMUNITY): Payer: Self-pay

## 2021-02-20 ENCOUNTER — Inpatient Hospital Stay (HOSPITAL_COMMUNITY): Payer: Medicaid Other

## 2021-02-20 DIAGNOSIS — R7881 Bacteremia: Secondary | ICD-10-CM | POA: Diagnosis not present

## 2021-02-20 DIAGNOSIS — F1990 Other psychoactive substance use, unspecified, uncomplicated: Secondary | ICD-10-CM | POA: Diagnosis not present

## 2021-02-20 DIAGNOSIS — B9562 Methicillin resistant Staphylococcus aureus infection as the cause of diseases classified elsewhere: Secondary | ICD-10-CM | POA: Diagnosis not present

## 2021-02-20 DIAGNOSIS — I079 Rheumatic tricuspid valve disease, unspecified: Secondary | ICD-10-CM | POA: Diagnosis not present

## 2021-02-20 DIAGNOSIS — M86159 Other acute osteomyelitis, unspecified femur: Secondary | ICD-10-CM | POA: Diagnosis not present

## 2021-02-20 MED ORDER — DOXYCYCLINE HYCLATE 100 MG PO TABS
100.0000 mg | ORAL_TABLET | Freq: Two times a day (BID) | ORAL | 0 refills | Status: AC
  Filled 2021-02-20: qty 56, 28d supply, fill #0

## 2021-02-20 MED ORDER — LINEZOLID 600 MG PO TABS
600.0000 mg | ORAL_TABLET | Freq: Two times a day (BID) | ORAL | 0 refills | Status: AC
  Filled 2021-02-20: qty 28, 14d supply, fill #0

## 2021-02-20 NOTE — Progress Notes (Signed)
PROGRESS NOTE    Kelly [name]  [id number]:[id number] DOB: [birth year] DOA: 01/19/2021 PCP: Pcp, No   Chief Complaint  Patient presents with   Abdominal Pain    Brief Narrative:  Kelly [name] is a 35 y.o. 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  Can possibly change to PO abx and d/c later this week.     Mircobiology: Blood culture on 9/8, 9/12 and 9/14: MRSA Blood culutre on 9/15: neg Pelvic abscess cx 9/15: MRSA   Subjective:  Continues to c/o left knee pain-   Objective: Vitals: Blood pressure 99/61, pulse 76, temperature 98.3 F (36.8 C), temperature source Oral, resp. rate 16, height 5' 7"  (1.702 m), weight 72.4 kg, SpO2 92 %, not currently breastfeeding.   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  Per ID-no benefit to repeating a echo/TEE-as it would not change management (patient not a candidate for surgery).   -ID:  Continue daptomycin here Ok to discharge from id standpoint  Plan 8 weeks abx 9/15-11/10 On discharge give 2 weeks linezolid 600 mg po bid, then complete course with doxycycline 100 mg po bid Id clinic f/u 11/02 @ 1115  PE -duplex negative for DVT -eliquis  Bacterial vaginosis: Completed a course of oral Flagyl.  Hyponatremia: SIADH  -monitor on fluid restrictions  Normocytic anemia: No overt blood loss-anemia due to critical/acute illness-follow-and  transfuse if <7   Acute urinary retention: Foley was removed on 02/07/2021, passing urine  Continue Flomax.  Prolonged QTC: Resolved-stable QTC on 9/9 and 9/18.     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 difficile infection in the past  Constipation: Likely due to narcotics-improving-still with some hard stools-on scheduled MiraLAX/senna and Movantik.  Received numerous enemas over the past few days.  Continue to watch closely.  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    She is on bowel regimen as noted above.  Patient aware that she will not be prescribed narcotics on discharge-and that she needs to contact her caseworker/outpatient methadone clinic for further narcotic needs.  She is agreeable with this plan.  She understands that when she gets closer to discharge-she will need to get in touch with her outpatient methadone clinic.    DVT prophylaxis: Lovenox Code Status: Full Family Communication: None at bedside Disposition:   Status is: Inpatient  Remains inpatient appropriate because:IV treatments appropriate due to intensity of illness or inability to take PO  Dispo: The patient is from: Home              Anticipated d/c is to: Home  Patient currently is not medically stable to d/c.-- plan for discharge on Wednesday   Difficult to place patient Yes   Consultants:  ID Ortho IR   Data Reviewed: I have personally reviewed following labs and imaging studies  Recent Labs  Lab 02/16/21 0349 02/19/21 0529  WBC 6.6 6.5  HGB 8.6* 8.9*  HCT 29.6* 30.6*  PLT 433* 399  MCV 84.3 84.1  MCH  24.5* 24.5*  MCHC 29.1* 29.1*  RDW 20.2* 19.7*    Recent Labs  Lab 02/16/21 0349 02/17/21 0500 02/18/21 1317 02/19/21 0529  NA 135  --   --  133*  K 4.2  --   --  4.2  CL 99  --   --  96*  CO2 29  --   --  29  GLUCOSE 102*  --   --  122*  BUN 24*  --   --  25*  CREATININE 0.63  --   --  0.60  CALCIUM 9.5  --   --  9.4  MG 1.9  --   --  1.7  CRP  --  0.8  --   --   DDIMER  --   --  1.04*  --     Radiology Studies: DG Knee 1-2 Views Left  Result Date: 02/20/2021 CLINICAL DATA:  Swelling.  Fall months ago. EXAM: LEFT KNEE - 1-2 VIEW COMPARISON:  None. FINDINGS: No evidence of fracture, dislocation, or joint effusion. No evidence of arthropathy or other focal bone abnormality. Soft tissues are unremarkable. IMPRESSION: Negative. Electronically Signed   By: Rolm Baptise M.D.   On: 02/20/2021 12:23   VAS [place] LOWER EXTREMITY VENOUS (DVT)  Result Date: 02/20/2021  Lower Venous DVT Study Patient Name:  Kelly [name]  Date of Exam:   02/19/2021 Medical Rec #: [id number]           Accession #:    [id number] Date of Birth: 1985/06/02            Patient Gender: F Patient Age:   35 years Exam Location:  [name] Procedure:      VAS [place] LOWER EXTREMITY VENOUS (DVT) Referring Phys: Eulogio Bear --------------------------------------------------------------------------------  Indications: Pain, and pulmonary embolism. Other Indications: Sepsis secondary to MRSA bacteremia and osteomyelitis/abscess                    involving the pubic symphysis. Risk Factors: IV drug abuse, recurrent endocarditis. Comparison Study: Prior negative left LEV done 01/16/2018 Performing Technologist: Sharion Dove RVS  Examination Guidelines: A complete evaluation includes B-mode imaging, spectral Doppler, color Doppler, and power Doppler as needed of all accessible portions of each vessel. Bilateral testing is considered an integral part of a complete examination. Limited examinations for reoccurring  indications may be performed as noted. The reflux portion of the exam is performed with the patient in reverse Trendelenburg.  +---------+---------------+---------+-----------+----------+-------------------+ RIGHT    CompressibilityPhasicitySpontaneityPropertiesThrombus Aging      +---------+---------------+---------+-----------+----------+-------------------+ CFV      Full                                         pulsatile waveforms +---------+---------------+---------+-----------+----------+-------------------+ SFJ      Full                                                             +---------+---------------+---------+-----------+----------+-------------------+  FV Prox  Full                                                             +---------+---------------+---------+-----------+----------+-------------------+ FV Mid   Full                                                             +---------+---------------+---------+-----------+----------+-------------------+ FV DistalFull                                                             +---------+---------------+---------+-----------+----------+-------------------+ PFV      Full                                                             +---------+---------------+---------+-----------+----------+-------------------+ POP      Full                                         pulsatile waveforms +---------+---------------+---------+-----------+----------+-------------------+ PTV      Full                                                             +---------+---------------+---------+-----------+----------+-------------------+ PERO     Full                                                             +---------+---------------+---------+-----------+----------+-------------------+   +---------+---------------+---------+-----------+----------+-------------------+ LEFT      CompressibilityPhasicitySpontaneityPropertiesThrombus Aging      +---------+---------------+---------+-----------+----------+-------------------+ CFV      Full                                         pulsatile waveforms +---------+---------------+---------+-----------+----------+-------------------+ SFJ      Full                                                             +---------+---------------+---------+-----------+----------+-------------------+ FV Prox  Full                                                             +---------+---------------+---------+-----------+----------+-------------------+  FV Mid   Full                                                             +---------+---------------+---------+-----------+----------+-------------------+ FV DistalFull                                                             +---------+---------------+---------+-----------+----------+-------------------+ PFV      Full                                                             +---------+---------------+---------+-----------+----------+-------------------+ POP      Full                                         pulsatile waveforms +---------+---------------+---------+-----------+----------+-------------------+ PTV      Full                                                             +---------+---------------+---------+-----------+----------+-------------------+ PERO     Full                                                             +---------+---------------+---------+-----------+----------+-------------------+     Summary: RIGHT: - There is an anechoic cyst-like structure noted in the proximal to mid thigh, measuring 0.91 cm X 1.20 cm. Etiology unknown   *See table(s) above for measurements and observations. Electronically signed by Orlie Pollen on 02/20/2021 at 6:48:21 AM.    Final     Scheduled Meds:  apixaban  10 mg Oral BID   Followed  by   Derrill Memo ON 02/25/2021] apixaban  5 mg Oral BID   Chlorhexidine Gluconate Cloth  6 each Topical Q0600   diclofenac Sodium  2 g Topical QID   magnesium oxide  400 mg Oral Daily   methadone  100 mg Oral Daily   naloxegol oxalate  25 mg Oral QAC breakfast   senna-docusate  2 tablet Oral BID   sodium chloride flush  10-40 mL Intracatheter Q12H   tamsulosin  0.4 mg Oral Daily   Continuous Infusions:  sodium chloride Stopped (02/18/21 2248)   DAPTOmycin (CUBICIN)  IV 650 mg (02/19/21 1954)     LOS: 32 days   Signature  Geradine Girt DO on 02/20/2021 at 2:51 PM   -  To page go to [email or web address]

## 2021-02-20 NOTE — Progress Notes (Signed)
Physical Therapy Treatment Patient Details Name: Kelly [name] MRN: [id number] DOB: 01/14/1986 Today's Date: 02/20/2021   History of Present Illness 35 y.o. female presents to [facility] hospital on 01/19/2021 with pelvic pain. Pt found to have sepsis 2/2 MRSA bacteremia. +pelvic abscesses with IR drainage 9/15; septic arthritis of pubic symphysis; pt will be in hospital x 6 weeks from 9/15 for antibiotics   PMH includes HTN, systolic CHF with moderate pulmonary hypertension and severe TR, polysubstance abuse including history of IV drug use, recurrent history of endocarditis, C. difficile colitis.    PT Comments    Patient initially anxious re: news she may leave hospital soon and go home on oral antibiotics. States anxious re: climbing 3 flights of stairs. Reminded pt how well she did last visit on stairs and pt became less anxious and agreed she could manage 3 flights. Per pt request, educated on use of cane and pt able to ambulate with cane with initial cues and min guard assist. Agrees if she goes home in the next couple days that she would prefer the RW for home use.     Recommendations for follow up therapy are one component of a multi-disciplinary discharge planning process, led by the attending physician.  Recommendations may be updated based on patient status, additional functional criteria and insurance authorization.  Follow Up Recommendations  No PT follow up     Equipment Recommendations  Rolling walker with 5" wheels;3in1 (PT)    Recommendations for Other Services       Precautions / Restrictions Precautions Precautions: None     Mobility  Bed Mobility                    Transfers Overall transfer level: Needs assistance Equipment used: Rolling walker (2 wheeled);None;Straight cane Transfers: Sit to/from Stand Sit to Stand: Modified independent (Device/Increase time);Min guard         General transfer comment: bed at lowest height; modified independent with  RW; minguard with cane  Ambulation/Gait Ambulation/Gait assistance: Modified independent (Device/Increase time);Min guard Gait Distance (Feet): 30 Feet (with RW "light" on her hands; 90 with cane and minguard assist; educated on step-to and then progressed to step-thru) Assistive device: Rolling walker (2 wheeled);Straight cane (simulating use of cane) Gait Pattern/deviations: Step-to pattern;Decreased stride length;Step-through pattern Gait velocity: slow   General Gait Details: reports no increase in pain in knee or pelvis with change from RW to cane; educated pt on where to obtain cane and how to adjust to proper height   Stairs             Wheelchair Mobility    Modified Rankin (Stroke Patients Only)       Balance Overall balance assessment: Needs assistance Sitting-balance support: Feet supported;Bilateral upper extremity supported Sitting balance-Leahy Scale: Fair Sitting balance - Comments: UE support for pain control Postural control: Posterior lean Standing balance support: No upper extremity supported Standing balance-Leahy Scale: Fair Standing balance comment: able to stand and brush teeth without UE assist and with minimal reaching                            Cognition Arousal/Alertness: Awake/alert Behavior During Therapy: WFL for tasks assessed/performed Overall Cognitive Status: Within Functional Limits for tasks assessed                                 General Comments:  Pt polite , agreeable to therapy, and trying to move and participate despite pain      Exercises General Exercises - Lower Extremity Hip Flexion/Marching: AROM;Both;5 reps;Standing;Seated (both positions) Heel Raises: AROM;Both;10 reps;Standing (vc for lighter UE support on RW) Mini-Sqauts:  (deferred due to increases left knee pain) Other Exercises Other Exercises: bridging x 5 reps; added 5 second hold and attempted marching in bridge position (pt could not  clear feet off mattress, but able to tolerate wt-shifting to each foot)    General Comments General comments (skin integrity, edema, etc.): Patient reported she may leave hospital this week. Anxious re: climbing flights to apartment. Reinforced how well she did last visit with climbing steps and pt less anxious.      Pertinent Vitals/Pain Pain Assessment: 0-10 Faces Pain Scale: Hurts whole lot Pain Location: left knee and pelvis Pain Descriptors / Indicators: Guarding;Discomfort Pain Intervention(s): Limited activity within patient's tolerance;Monitored during session    Home Living                      Prior Function            PT Goals (current goals can now be found in the care plan section) Acute Rehab PT Goals Patient Stated Goal: to reduce pain PT Goal Formulation: With patient Time For Goal Achievement: 02/27/21 Potential to Achieve Goals: Fair Progress towards PT goals: Progressing toward goals    Frequency    Min 2X/week      PT Plan Current plan remains appropriate    Co-evaluation              AM-PAC PT "6 Clicks" Mobility   Outcome Measure  Help needed turning from your back to your side while in a flat bed without using bedrails?: None Help needed moving from lying on your back to sitting on the side of a flat bed without using bedrails?: None Help needed moving to and from a bed to a chair (including a wheelchair)?: None Help needed standing up from a chair using your arms (e.g., wheelchair or bedside chair)?: None Help needed to walk in hospital room?: None Help needed climbing 3-5 steps with a railing? : A Little 6 Click Score: 23    End of Session   Activity Tolerance: Patient tolerated treatment well Patient left: with call bell/phone within reach;in chair Nurse Communication: Mobility status;Other (comment) (can be up independently in room with tele box) PT Visit Diagnosis: Unsteadiness on feet (R26.81);Other abnormalities of  gait and mobility (R26.89);Muscle weakness (generalized) (M62.81);Pain Pain - part of body:  (pelvis and LEs)     Time: 4035-2481 PT Time Calculation (min) (ACUTE ONLY): 21 min  Charges:  $Gait Training: 8-22 mins                      Arby Barrette, PT Pager [phone]    Kelly [name] 02/20/2021, 1:32 PM

## 2021-02-20 NOTE — Progress Notes (Addendum)
[facility]ious Disease  Date of Admission:  01/19/2021     Abx: 9/14-c dapto  9/14-19 ceftaroline 9/09-14 vanc            ASSESSMENT: 35 yo female ivdu, hx mrsa TV endocarditis s/p abx course, readmitted 9/08 with mrsa bacteremia/ongoing ivdu presumed another endocarditis bout, with other complication including cavitary changes in lungs, pelvis/lower back pain, with negative lumbar mri, but pelvis mri showing pubic symphysis septic arthritis, pelvis soft tissue myositis/small absess. She is s/p drain attempt by IR with only a few cc  She remains on vancomycin now after a week of dapto/ceftaroline for persistent bactermia. Bcx cleared on 9/15  No tee done as presumed endocarditis again. Tte on admission severe tr and thickened tv leaflets, not well visualized AV, minimal mv regurg  Overall clinically much improved  Plan tx course 8 weeks.   ----------- 10/10 assessment It is almost 4 week into treatment course I discussed with her we could transition her to oral abx and discharge with id f/u  She is open to that idea, stating she'll need to have her methadone clinic setup prior to discharge. She'll stay at her sister's house  I stress importance of following up to make sure infection well treated prior to stopping abx  She did have lumbar back pain early in admission, so we had planned to treat 8 weeks at least  Primary team evaluated left knee pain at this time. Doesn't appear to be septic arthritis on exam today    PLAN:  Continue daptomycin here Ok to discharge from id standpoint  Plan 8 weeks abx 9/15-11/10 On discharge give 2 weeks linezolid 600 mg po bid, then complete course with doxycycline 100 mg po bid Id clinic f/u 11/02 @ 1115 Discussed with primary team Id will sign off     I spent more than 35 minute reviewing data/chart, and coordinating care and >50% direct face to face time providing counseling/discussing diagnostics/treatment  plan with patient  Principal Problem:   MRSA bacteremia Active Problems:   Back pain   Polysubstance abuse ([place])   IVDU (intravenous drug user)   Hyponatremia   Endocarditis of tricuspid valve   Severe tricuspid regurgitation   Prolonged QT interval   Sepsis ([place])   Constipation   Pelvic fluid collection   Acute osteomyelitis of pelvic region ([place])    apixaban  10 mg Oral BID   Followed by   Derrill Memo ON 02/25/2021] apixaban  5 mg Oral BID   Chlorhexidine Gluconate Cloth  6 each Topical Q0600   diclofenac Sodium  2 g Topical QID   magnesium oxide  400 mg Oral Daily   methadone  100 mg Oral Daily   naloxegol oxalate  25 mg Oral QAC breakfast   senna-docusate  2 tablet Oral BID   sodium chloride flush  10-40 mL Intracatheter Q12H   tamsulosin  0.4 mg Oral Daily    SUBJECTIVE:  Doing well No n/v/diarrhea/rash Mild pain left groin much improved from prior No back pain  Open to idea of going home with close id f/u  Allergies  Allergen Reactions   Hydrocodone Itching   Morphine And Related Nausea And Vomiting     Review of Systems: All other ros negative    OBJECTIVE: Vitals:   02/20/21 0003 02/20/21 0617 02/20/21 0757 02/20/21 1155  BP: 101/61 109/71 109/82 99/61  Pulse: 66 66 67 76  Resp: 18 18 16 16   Temp: 98.1 F (36.7 C)  98.7 F (37.1 C) 97.9 F (36.6 C) 98.3 F (36.8 C)  TempSrc: Oral Oral Oral Oral  SpO2: 94% 96% 95% 92%  Weight:      Height:       Body mass index is 25 kg/m.  Physical exam: General/constitutional: no distress, pleasant HEENT: Normocephalic, PER, Conj Clear, EOMI, Oropharynx clear Neck supple CV: rrr no mrg Lungs: clear to auscultation, normal respiratory effort Abd: Soft, Nontender Ext: no edema Skin: No Rash Neuro: nonfocal MSK: no peripheral joint swelling/tenderness/warmth; back spines nontender. Left knee good active rom     Lab Results Lab Results  Component Value Date   WBC 6.5 02/19/2021   HGB 8.9 (L)  02/19/2021   HCT 30.6 (L) 02/19/2021   MCV 84.1 02/19/2021   PLT 399 02/19/2021    Lab Results  Component Value Date   CREATININE 0.60 02/19/2021   BUN 25 (H) 02/19/2021   NA 133 (L) 02/19/2021   K 4.2 02/19/2021   CL 96 (L) 02/19/2021   CO2 29 02/19/2021    Lab Results  Component Value Date   ALT <5 01/26/2021   AST 10 (L) 01/26/2021   ALKPHOS 103 01/26/2021   BILITOT 0.4 01/26/2021     Microbiology: No results found for this or any previous visit (from the past 240 hour(s)).   10/08 ct chest with contrast 1. Widespread nodular architectural distortion and septal thickening scattered throughout the lungs bilaterally. Findings likely reflect sequela of recurrent septic embolization. Which of these areas represents acute septic emboli versus chronic scarring is uncertain. 2. Occlusive embolus in segmental and subsegmental size pulmonary artery branches in the right lower lobe, as above. 3. Cardiomegaly with severe right ventricular and right atrial dilatation. 4. Passive hepatic congestion with severe hepatomegaly.  9/30 ct pelv/abd 1. There is redemonstrated diastasis of the pubic symphysis with a fluid collection about the joint space measuring approximately 5.2 x 3.4 cm, with an additional component about the anterior right aspect of the pubic symphysis measuring 1.6 x 1.0 cm. Findings are very similar to prior MR dated 01/24/2021 and consistent with septic arthritis/osteomyelitis. 2. Hepatomegaly and hepatic steatosis. 3. Status post cholecystectomy.       Jabier Mutton, [facility] [place] [phone]  pager   [phone] cell 02/20/2021, 2:16 PM   02/20/2021  2:02 PM

## 2021-02-21 DIAGNOSIS — R7881 Bacteremia: Secondary | ICD-10-CM | POA: Diagnosis not present

## 2021-02-21 DIAGNOSIS — B9562 Methicillin resistant Staphylococcus aureus infection as the cause of diseases classified elsewhere: Secondary | ICD-10-CM | POA: Diagnosis not present

## 2021-02-21 LAB — BASIC METABOLIC PANEL
Anion gap: 7 (ref 5–15)
BUN: 28 mg/dL — ABNORMAL HIGH (ref 6–20)
CO2: 29 mmol/L (ref 22–32)
Calcium: 9.6 mg/dL (ref 8.9–10.3)
Chloride: 98 mmol/L (ref 98–111)
Creatinine, Ser: 0.64 mg/dL (ref 0.44–1.00)
GFR, Estimated: >60 mL/min (ref >60)
Glucose, Bld: 101 mg/dL — ABNORMAL HIGH (ref 70–99)
Potassium: 4.5 mmol/L (ref 3.5–5.1)
Sodium: 134 mmol/L — ABNORMAL LOW (ref 135–145)

## 2021-02-21 NOTE — Progress Notes (Signed)
PROGRESS NOTE    Kelly [name]  HCW:[id number] DOB: 1985/06/11 DOA: 01/19/2021 PCP: Pcp, No   Chief Complaint  Patient presents with   Abdominal Pain    Brief Narrative:  Kelly [name] is a 35 y.o. 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  Can possibly change to PO abx and d/c later this week.     Mircobiology: Blood culture on 9/8, 9/12 and 9/14: MRSA Blood culutre on 9/15: neg Pelvic abscess cx 9/15: MRSA   Subjective:  Up walking around the unit   Objective: Vitals: Blood pressure 94/60, pulse 91, temperature 97.6 F (36.4 C), temperature source Oral, resp. rate 16, height 5' 7"  (1.702 m), weight 72.4 kg, SpO2 93 %, not currently breastfeeding.   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  Per ID-no benefit to repeating a echo/TEE-as it would not change management (patient not a candidate for surgery).   -ID:  Continue daptomycin here Ok to discharge from id standpoint  Plan 8 weeks abx 9/15-11/10 On discharge give 2 weeks linezolid 600 mg po bid, then complete course with doxycycline 100 mg po bid Id clinic f/u 11/02 @ 1115  PE -duplex negative for DVT -eliquis  Bacterial vaginosis: Completed a course of oral Flagyl.  Hyponatremia: SIADH  -monitor on fluid restrictions  Normocytic anemia: No overt blood loss-anemia due to critical/acute illness-follow-and transfuse if <7   Acute urinary retention: Foley was removed on  02/07/2021, passing urine  Continue Flomax.  Prolonged QTC: Resolved-stable QTC on 9/9 and 9/18.     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 difficile infection in the past  Constipation: Likely due to narcotics-improving-still with some hard stools-on scheduled MiraLAX/senna and Movantik.  Received numerous enemas over the past few days.  Continue to watch closely.  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    She is on bowel regimen as noted above.  Patient aware that she will not be prescribed narcotics on discharge-and that she needs to contact her caseworker/outpatient methadone clinic for further narcotic needs.  She is agreeable with this plan.   -have ordered EKG for Qtc- patient states she will need for methadone clinic    DVT prophylaxis: eliquis Code Status: Full Family Communication: None at bedside Disposition:   Status is: Inpatient  Remains inpatient appropriate because:IV treatments appropriate due to intensity of illness or inability to take PO  Dispo: The patient is from: Home              Anticipated d/c is to: Home              Patient currently is not medically stable to d/c.-- plan for discharge on 10/11  Difficult to place patient Yes   Consultants:  ID Ortho IR   Data Reviewed: I have personally reviewed following labs and imaging studies  Recent Labs  Lab 02/16/21 0349 02/19/21 0529  WBC 6.6 6.5  HGB 8.6* 8.9*  HCT 29.6* 30.6*  PLT 433* 399  MCV 84.3 84.1  MCH 24.5* 24.5*  MCHC 29.1* 29.1*  RDW 20.2* 19.7*    Recent Labs  Lab 02/16/21 0349 02/17/21 0500 02/18/21 1317  02/19/21 0529 02/21/21 0344  NA 135  --   --  133* 134*  K 4.2  --   --  4.2 4.5  CL 99  --   --  96* 98  CO2 29  --   --  29 29  GLUCOSE 102*  --   --  122* 101*  BUN 24*  --   --  25* 28*  CREATININE 0.63  --   --  0.60 0.64  CALCIUM 9.5  --   --  9.4 9.6  MG 1.9  --   --  1.7  --   CRP  --  0.8  --   --   --   DDIMER  --   --  1.04*  --   --     Radiology Studies: DG Knee 1-2 Views Left  Result Date: 02/20/2021 CLINICAL DATA:  Swelling.  Fall months ago. EXAM: LEFT KNEE - 1-2 VIEW COMPARISON:  None. FINDINGS: No evidence of fracture, dislocation, or joint effusion. No evidence of arthropathy or other focal bone abnormality. Soft tissues are unremarkable. IMPRESSION: Negative. Electronically Signed   By: Rolm Baptise M.D.   On: 02/20/2021 12:23   VAS [place] LOWER EXTREMITY VENOUS (DVT)  Result Date: 02/20/2021  Lower Venous DVT Study Patient Name:  Kelly [name]  Date of Exam:   02/19/2021 Medical Rec #: [id number]           Accession #:    [id number] Date of Birth: 1985/07/04            Patient Gender: F Patient Age:   18 years Exam Location:  [facility] Procedure:      VAS [place] LOWER EXTREMITY VENOUS (DVT) Referring Phys: Eulogio Bear --------------------------------------------------------------------------------  Indications: Pain, and pulmonary embolism. Other Indications: Sepsis secondary to MRSA bacteremia and osteomyelitis/abscess                    involving the pubic symphysis. Risk Factors: IV drug abuse, recurrent endocarditis. Comparison Study: Prior negative left LEV done 01/16/2018 Performing Technologist: Sharion Dove RVS  Examination Guidelines: A complete evaluation includes B-mode imaging, spectral Doppler, color Doppler, and power Doppler as needed of all accessible portions of each vessel. Bilateral testing is considered an integral part of a complete examination. Limited examinations for reoccurring indications may be performed as noted. The reflux portion of the  exam is performed with the patient in reverse Trendelenburg.  +---------+---------------+---------+-----------+----------+-------------------+ RIGHT    CompressibilityPhasicitySpontaneityPropertiesThrombus Aging      +---------+---------------+---------+-----------+----------+-------------------+ CFV      Full                                         pulsatile waveforms +---------+---------------+---------+-----------+----------+-------------------+ SFJ      Full                                                             +---------+---------------+---------+-----------+----------+-------------------+  FV Prox  Full                                                             +---------+---------------+---------+-----------+----------+-------------------+ FV Mid   Full                                                             +---------+---------------+---------+-----------+----------+-------------------+ FV DistalFull                                                             +---------+---------------+---------+-----------+----------+-------------------+ PFV      Full                                                             +---------+---------------+---------+-----------+----------+-------------------+ POP      Full                                         pulsatile waveforms +---------+---------------+---------+-----------+----------+-------------------+ PTV      Full                                                             +---------+---------------+---------+-----------+----------+-------------------+ PERO     Full                                                             +---------+---------------+---------+-----------+----------+-------------------+   +---------+---------------+---------+-----------+----------+-------------------+ LEFT     CompressibilityPhasicitySpontaneityPropertiesThrombus Aging       +---------+---------------+---------+-----------+----------+-------------------+ CFV      Full                                         pulsatile waveforms +---------+---------------+---------+-----------+----------+-------------------+ SFJ      Full                                                             +---------+---------------+---------+-----------+----------+-------------------+ FV Prox  Full                                                             +---------+---------------+---------+-----------+----------+-------------------+  FV Mid   Full                                                             +---------+---------------+---------+-----------+----------+-------------------+ FV DistalFull                                                             +---------+---------------+---------+-----------+----------+-------------------+ PFV      Full                                                             +---------+---------------+---------+-----------+----------+-------------------+ POP      Full                                         pulsatile waveforms +---------+---------------+---------+-----------+----------+-------------------+ PTV      Full                                                             +---------+---------------+---------+-----------+----------+-------------------+ PERO     Full                                                             +---------+---------------+---------+-----------+----------+-------------------+     Summary: RIGHT: - There is an anechoic cyst-like structure noted in the proximal to mid thigh, measuring 0.91 cm X 1.20 cm. Etiology unknown   *See table(s) above for measurements and observations. Electronically signed by Orlie Pollen on 02/20/2021 at 6:48:21 AM.    Final     Scheduled Meds:  apixaban  10 mg Oral BID   Followed by   Derrill Memo ON 02/25/2021] apixaban  5 mg Oral BID   Chlorhexidine  Gluconate Cloth  6 each Topical Q0600   diclofenac Sodium  2 g Topical QID   magnesium oxide  400 mg Oral Daily   methadone  100 mg Oral Daily   naloxegol oxalate  25 mg Oral QAC breakfast   senna-docusate  2 tablet Oral BID   sodium chloride flush  10-40 mL Intracatheter Q12H   tamsulosin  0.4 mg Oral Daily   Continuous Infusions:  sodium chloride Stopped (02/18/21 2248)   DAPTOmycin (CUBICIN)  IV 650 mg (02/20/21 2053)     LOS: 33 days   Signature  Geradine Girt DO on 02/21/2021 at 1:26 PM   -  To page go to [email or web address]

## 2021-02-21 NOTE — Progress Notes (Signed)
Physical Therapy Treatment Patient Details Name: Kelly [name] MRN: [id number] DOB: 15-Nov-1985 Today's Date: 02/21/2021   History of Present Illness 35 y.o. female presents to [facility] hospital on 01/19/2021 with pelvic pain. Pt found to have sepsis 2/2 MRSA bacteremia. +pelvic abscesses with IR drainage 9/15; septic arthritis of pubic symphysis; pt will be in hospital x 6 weeks from 9/15 for antibiotics   PMH includes HTN, systolic CHF with moderate pulmonary hypertension and severe TR, polysubstance abuse including history of IV drug use, recurrent history of endocarditis, C. difficile colitis.    PT Comments    Patient expressed concern during 02/20/21 PT session re: climbing the 3 flights of steps to apartment. Requesting to practice again. Per pt request (as pt had already met stair goal), returned today with step for in room practice (due to on contact precautions). Patient did well with 10 steps and stopped at that point due to increasing hip pain. Reports she thinks hip is more sore than usual due to lots of walking today and doing her bridging exercise. Agrees she will be able to climb to 3rd floor apartment if discharged tomorrow.     Recommendations for follow up therapy are one component of a multi-disciplinary discharge planning process, led by the attending physician.  Recommendations may be updated based on patient status, additional functional criteria and insurance authorization.  Follow Up Recommendations  No PT follow up     Equipment Recommendations  Rolling walker with 5" wheels;3in1 (PT)    Recommendations for Other Services       Precautions / Restrictions Precautions Precautions: None     Mobility  Bed Mobility                    Transfers Overall transfer level: Modified independent Equipment used: Rolling walker (2 wheeled) Transfers: Sit to/from Stand Sit to Stand: Modified independent (Device/Increase time)             Ambulation/Gait Ambulation/Gait assistance: Modified independent (Device/Increase time);Min guard Gait Distance (Feet): 300 Feet (pt observed walking in hall several times today) Assistive device: Rolling walker (2 wheeled) Gait Pattern/deviations: Step-to pattern;Decreased stride length;Step-through pattern Gait velocity: slow       Stairs   Stairs assistance: Supervision Stair Management: One rail Left;Step to pattern;Forwards Number of Stairs: 10 General stair comments: Pt requesting during visit 02/20/21 to practice steps again as she will be going home this week (likely 02/22/21). Brought single step into room (pt on isolation) and used elevated bed rail as rail for steps. pt able to ascend with RLE and descend with LLE as this is her weaker, more painful leg.   Wheelchair Mobility    Modified Rankin (Stroke Patients Only)       Balance Overall balance assessment: Needs assistance Sitting-balance support: Feet supported;No upper extremity supported Sitting balance-Leahy Scale: Good     Standing balance support: No upper extremity supported Standing balance-Leahy Scale: Fair                              Cognition Arousal/Alertness: Awake/alert Behavior During Therapy: WFL for tasks assessed/performed Overall Cognitive Status: Within Functional Limits for tasks assessed                                 General Comments: Pt polite , agreeable to therapy, and trying to move and participate despite pain  Exercises      General Comments        Pertinent Vitals/Pain Pain Assessment: 0-10 Faces Pain Scale: Hurts whole lot Pain Location: left knee and pelvis Pain Descriptors / Indicators: Guarding;Discomfort Pain Intervention(s): Limited activity within patient's tolerance;Monitored during session    Home Living                      Prior Function            PT Goals (current goals can now be found in the care plan  section) Acute Rehab PT Goals Patient Stated Goal: to reduce pain Time For Goal Achievement: 02/27/21 Potential to Achieve Goals: Fair Progress towards PT goals: Progressing toward goals    Frequency    Min 2X/week      PT Plan Current plan remains appropriate    Co-evaluation              AM-PAC PT "6 Clicks" Mobility   Outcome Measure  Help needed turning from your back to your side while in a flat bed without using bedrails?: None Help needed moving from lying on your back to sitting on the side of a flat bed without using bedrails?: None Help needed moving to and from a bed to a chair (including a wheelchair)?: None Help needed standing up from a chair using your arms (e.g., wheelchair or bedside chair)?: None Help needed to walk in hospital room?: None Help needed climbing 3-5 steps with a railing? : A Little 6 Click Score: 23    End of Session   Activity Tolerance: Patient tolerated treatment well Patient left: with call bell/phone within reach;in bed   PT Visit Diagnosis: Unsteadiness on feet (R26.81);Other abnormalities of gait and mobility (R26.89);Muscle weakness (generalized) (M62.81);Pain Pain - part of body:  (pelvis and LEs)     Time: 2902-1115 PT Time Calculation (min) (ACUTE ONLY): 10 min  Charges:  $Gait Training: 8-22 mins                      Arby Barrette, PT Pager [phone]    Rexanne Mano 02/21/2021, 4:01 PM

## 2021-02-22 ENCOUNTER — Other Ambulatory Visit (HOSPITAL_COMMUNITY): Payer: Self-pay

## 2021-02-22 DIAGNOSIS — K5900 Constipation, unspecified: Secondary | ICD-10-CM | POA: Diagnosis not present

## 2021-02-22 DIAGNOSIS — M86159 Other acute osteomyelitis, unspecified femur: Secondary | ICD-10-CM | POA: Diagnosis not present

## 2021-02-22 DIAGNOSIS — B9562 Methicillin resistant Staphylococcus aureus infection as the cause of diseases classified elsewhere: Secondary | ICD-10-CM | POA: Diagnosis not present

## 2021-02-22 DIAGNOSIS — R7881 Bacteremia: Secondary | ICD-10-CM | POA: Diagnosis not present

## 2021-02-22 LAB — CBC
HCT: 30.2 % — ABNORMAL LOW (ref 36.0–46.0)
Hemoglobin: 8.7 g/dL — ABNORMAL LOW (ref 12.0–15.0)
MCH: 24.4 pg — ABNORMAL LOW (ref 26.0–34.0)
MCHC: 28.8 g/dL — ABNORMAL LOW (ref 30.0–36.0)
MCV: 84.8 fL (ref 80.0–100.0)
Platelets: 373 10*3/uL (ref 150–400)
RBC: 3.56 MIL/uL — ABNORMAL LOW (ref 3.87–5.11)
RDW: 19.5 % — ABNORMAL HIGH (ref 11.5–15.5)
WBC: 6.8 10*3/uL (ref 4.0–10.5)
nRBC: 0 % (ref 0.0–0.2)

## 2021-02-22 LAB — BASIC METABOLIC PANEL
Anion gap: 9 (ref 5–15)
BUN: 27 mg/dL — ABNORMAL HIGH (ref 6–20)
CO2: 29 mmol/L (ref 22–32)
Calcium: 9.5 mg/dL (ref 8.9–10.3)
Chloride: 97 mmol/L — ABNORMAL LOW (ref 98–111)
Creatinine, Ser: 0.54 mg/dL (ref 0.44–1.00)
GFR, Estimated: >60 mL/min (ref >60)
Glucose, Bld: 96 mg/dL (ref 70–99)
Potassium: 4.3 mmol/L (ref 3.5–5.1)
Sodium: 135 mmol/L (ref 135–145)

## 2021-02-22 LAB — CK: Total CK: 16 U/L — ABNORMAL LOW (ref 38–234)

## 2021-02-22 LAB — MAGNESIUM: Magnesium: 1.9 mg/dL (ref 1.7–2.4)

## 2021-02-22 MED ORDER — APIXABAN (ELIQUIS) VTE STARTER PACK (10MG AND 5MG)
ORAL_TABLET | ORAL | 2 refills | Status: DC
Start: 2021-02-22 — End: 2023-12-03
  Filled 2021-02-22: qty 74, 30d supply, fill #0

## 2021-02-22 MED ORDER — SENNOSIDES-DOCUSATE SODIUM 8.6-50 MG PO TABS
2.0000 | ORAL_TABLET | Freq: Every day | ORAL | 2 refills | Status: DC
Start: 2021-02-22 — End: 2023-12-03
  Filled 2021-02-22: qty 60, 30d supply, fill #0

## 2021-02-22 MED ORDER — POLYETHYLENE GLYCOL 3350 17 GM/SCOOP PO POWD
17.0000 g | Freq: Every day | ORAL | 2 refills | Status: DC
  Filled 2021-02-22: qty 238, 14d supply, fill #0

## 2021-02-22 NOTE — Progress Notes (Signed)
Kelly [name] to be D/C'd Home per MD order.  Discussed with the patient and all questions fully answered.  VSS, Skin clean, dry and intact without evidence of skin break down, no evidence of skin tears noted. IV catheter discontinued intact. Site without signs and symptoms of complications. Dressing and pressure applied.  An After Visit Summary was printed and given to the patient. Patient received prescription.  D/c education completed with patient/family including follow up instructions, medication list, d/c activities limitations if indicated, with other d/c instructions as indicated by MD - patient able to verbalize understanding, all questions fully answered.   Patient instructed to return to ED, call 911, or call MD for any changes in condition.   Patient escorted via [place], and D/C home via private auto.  [name] 02/22/2021 3:02 PM

## 2021-02-22 NOTE — TOC Transition Note (Signed)
Transition of Care [facility]) - CM/SW Discharge Note   Patient Details  Name: Kelly [name] MRN: [id number] Date of Birth: May 13, 1986  [facility] CM/SW Contact:  Cyndi Bender, RN Phone Number: 02/22/2021, 4:01 PM   Clinical Narrative:   Patient stable for discharge. Orders for walker. Spoke to patient regarding transition needs.Patient agreeable to use in house provider for walker delivery. Also provided a list of methadone clinics.     Final next level of care: Home/Self Care Barriers to Discharge: Barriers Resolved   Patient Goals and CMS Choice Patient states their goals for this hospitalization and ongoing recovery are:: home with sister      Discharge Placement               Home with sister        Discharge Plan and Services In-house Referral: Clinical Social Work Discharge Planning Services: CM Consult            DME Arranged: Gilford Rile rolling DME Agency: [employer] Date DME Agency Contacted: 02/22/21 Time DME Agency Contacted: 1200 Representative spoke with at DME Agency: [place] ([place]) Interventions     Readmission Risk Interventions Readmission Risk Prevention Plan 08/25/2020 07/30/2018  Transportation Screening Complete Complete  Transportation Screening Comment Rooms at the [facility] to provide transportation for the patient. -  PCP or Specialist Appt within 5-7 Days Complete -  Not Complete comments 08/29/20 OB/GYN appointment scheduled -  Home Care Screening Complete -  Medication Review (RN Care Manager) - Complete  PCP or Specialist appointment within 3-5 days of discharge - Complete  HRI or [place] - Not Complete  HRI or Home Care Consult Pt Refusal Comments - N/A  SW Recovery Care/Counseling Consult - Complete  Palliative Care Screening - Not Complete  Comments - N/A  Skilled Nursing Facility - Not Complete  SNF Comments - N/A  Some recent data might be hidden

## 2021-02-22 NOTE — Discharge Instructions (Signed)
Information on my medicine - ELIQUIS (apixaban)  This medication education was reviewed with me or my healthcare representative as part of my discharge preparation.  The pharmacist that spoke with me during my hospital stay was:    Why was Eliquis prescribed for you? 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  What do You need to know about Eliquis ? The starting dose is 10 mg (two 5 mg tablets) taken TWICE daily for the FIRST SEVEN (7) DAYS, then on (enter date)  02/25/21  the dose is reduced to ONE 5 mg tablet taken TWICE daily.  Eliquis may be taken with or without food.   Try to take the dose about the same time in the morning and in the evening. If you have difficulty swallowing the tablet whole please discuss with your pharmacist how to take the medication safely.  Take Eliquis exactly as prescribed and DO NOT stop taking Eliquis without talking to the doctor who prescribed the medication.  Stopping may increase your risk of developing a new blood clot.  Refill your prescription before you run out.  After discharge, you should have regular check-up appointments with your healthcare provider that is prescribing your Eliquis.    What do you do if you miss a dose? If a dose of ELIQUIS is not taken at the scheduled time, take it as soon as possible on the same day and twice-daily administration should be resumed. The dose should not be doubled to make up for a missed dose.  Important Safety Information A possible side effect of Eliquis is bleeding. You should call your healthcare provider right away if you experience any of the following: Bleeding from an injury or your nose that does not stop. Unusual colored urine (red or dark brown) or unusual colored stools (red or black). Unusual bruising for unknown reasons. A  serious fall or if you hit your head (even if there is no bleeding).  Some medicines may interact with Eliquis and might increase your risk of bleeding or clotting while on Eliquis. To help avoid this, consult your healthcare provider or pharmacist prior to using any new prescription or non-prescription medications, including herbals, vitamins, non-steroidal anti-inflammatory drugs (NSAIDs) and supplements.  This website has more information on Eliquis (apixaban): http://[email or web address].[email or web address]/home

## 2021-02-22 NOTE — Discharge Summary (Signed)
PATIENT DETAILS Name: Kelly [name] Age: 35 y.o. Sex: female Date of Birth: 1986-04-09 MRN: [id number]. Admitting Physician: Norval Morton, MD PCP:Pcp, No  Admit Date: 01/19/2021 Discharge date: 02/22/2021  Recommendations for Outpatient Follow-up:  Follow up with PCP in 1-2 weeks Please ensure follow-up at the ID clinic  Admitted From:  Home  Disposition: [place]: No  Equipment/Devices: None  Discharge Condition: Stable  CODE STATUS: FULL CODE  Diet recommendation:  Diet Order             Diet - low sodium heart healthy           Diet regular Room service appropriate? Yes; Fluid consistency: Thin; Fluid restriction: 1800 mL Fluid  Diet effective now                    Brief Summary: Kelly [name] is a 35 y.o. 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  She was managed with IV antibiotics-ID followed closely with recommendations to switch to oral Zyvox x2 weeks, followed by oral doxycycline.  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  Per ID-no benefit on repeating a echo/TEE-as it would not change management-patient is not a candidate for valve replacement surgery.  Patient was also seen by orthopedics for abscess of her pubic bone-she underwent IR guided drainage-cultures was positive for MRSA as well.  Patient has defervesced-has done well-has completed several weeks of IV antibiotics in the hospital-she acknowledges  clinical improvement.  Per ID-on discharge-recommendations are for 2 weeks of linezolid-followed by doxycycline complete a course of antibiotics on 11/10.    Pulmonary embolism: Continue Eliquis.  Lower extremity Dopplers were negative.  Likely provoked in the setting of acute hospitalization.  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  Normocytic anemia: Due to critical illness/acute illness-no overt blood loss.  Acute urinary retention: Hospital course complicated during the early stages by acute urinary retention-this is probably due to significant pelvic pain from pelvic abscess.  She was started on Flomax-Foley catheter was removed on 9/27-she is now voiding spontaneously.  Constipation: Due to narcotic use-managed with supportive care.  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 difficile infection in the past  History of IVDA (UDS on 9/9 positive for benzos, opiates, amphetamine and cocaine) extensive counseling done throughout this hospital stay-including on the day of discharge.  Patient was encouraged to continue to abstain from further drug use.  Chronic narcotic dependence: Per patient-she was on methadone 120 mg daily prior to this hospitalization-however since she relapsed-she was not able to get further methadone.  She was restarted on methadone during this hospital stay-and has been encouraged over the past few days/weeks to get in touch with the methadone clinic.  On 10/12-she informed this MD that she has managed to get in touch with her methadone clinic/outpatient caseworker-she will resume her usual dosing of methadone  in the outpatient setting.  Since she will be on Zyvox-she will need close QTC monitoring.   Procedures 9/15>>CT aspiration  symphysis pubis fluid colletion   Discharge Diagnoses:  Principal Problem:   MRSA bacteremia Active Problems:   Back pain   Polysubstance abuse (HCC)   IVDU (intravenous drug user)   Hyponatremia   Endocarditis of tricuspid valve   Severe tricuspid regurgitation   Prolonged QT interval   Sepsis (HCC)   Constipation   Pelvic fluid collection   Acute osteomyelitis of pelvic region [facility])   Discharge Instructions:  Activity:  As tolerated  Discharge Instructions     Diet - low sodium heart healthy   Complete by: As directed    Discharge instructions   Complete by: As directed    1.)  Follow-up with your methadone clinic for further doses of methadone  2.)  Please follow-up with the infectious disease clinic as instructed  3.)  Continue taking your oral antibiotics as instructed  4.)  Refrain from further illicit drug use/IV drug use-as you already have an infection in your heart valve   Follow with Primary MD in 1-2 weeks  Please get a complete blood count and chemistry panel checked by your Primary MD at your next visit, and again as instructed by your Primary MD.  Get Medicines reviewed and adjusted: Please take all your medications with you for your next visit with your Primary MD  Laboratory/radiological data: Please request your Primary MD to go over all hospital tests and procedure/radiological results at the follow up, please ask your Primary MD to get all Hospital records sent to his/her office.  In some cases, they will be blood work, cultures and biopsy results pending at the time of your discharge. Please request that your primary care M.D. follows up on these results.  Also Note the following: If you experience worsening of your admission symptoms, develop shortness of breath, life threatening emergency, suicidal or homicidal thoughts you must seek medical attention immediately by calling 911 or calling your MD immediately  if symptoms less severe.  You  must read complete instructions/literature along with all the possible adverse reactions/side effects for all the Medicines you take and that have been prescribed to you. Take any new Medicines after you have completely understood and accpet all the possible adverse reactions/side effects.   Do not drive when taking Pain medications or sleeping medications (Benzodaizepines)  Do not take more than prescribed Pain, Sleep and Anxiety Medications. It is not advisable to combine anxiety,sleep and pain medications without talking with your primary care practitioner  Special Instructions: If you have smoked or chewed Tobacco  in the last 2 yrs please stop smoking, stop any regular Alcohol  and or any Recreational drug use.  Wear Seat belts while driving.  Please note: You were cared for by a hospitalist during your hospital stay. Once you are discharged, your primary care physician will handle any further medical issues. Please note that NO REFILLS for any discharge medications will be authorized once you are discharged, as it is imperative that you return to your primary care physician (or establish a relationship with a primary care physician if you do not have one) for your post hospital discharge needs so that they can reassess your need for medications and monitor your lab values.   Increase activity slowly   Complete by: As directed    No wound care   Complete by: As directed       Allergies  as of 02/22/2021       Reactions   Hydrocodone Itching   Morphine And Related Nausea And Vomiting        Medication List     STOP taking these medications    furosemide 40 MG tablet Commonly known as: LASIX   METHADONE HCL DISKETS PO   NIFEdipine 30 MG 24 hr tablet Commonly known as: ADALAT CC   prenatal vitamin w/FE, FA 29-1 MG Chew chewable tablet   sertraline 50 MG tablet Commonly known as: ZOLOFT       TAKE these medications    Apixaban Starter Pack (23m and 588m Commonly  known as: ELIQUIS STARTER PACK Take as directed on package: start with two-73m673mablets twice daily for 7 days (until 10/14). On day 8 (on 10/15), switch to one-73mg21mblet twice daily.   doxycycline 100 MG tablet Commonly known as: VIBRA-TABS AFTER 2 WEEKS OF LINEZOLID: Take 1 tablet (100 mg total) by mouth 2 (two) times daily for 28 days. Start taking on: March 07, 2021   linezolid 600 MG tablet Commonly known as: ZYVOX Take 1 tablet (600 mg total) by mouth 2 (two) times daily for 14 days.   polyethylene glycol 17 g packet Commonly known as: MIRALAX / GLYCOLAX Take 17 g by mouth daily.   senna-docusate 8.6-50 MG tablet Commonly known as: Senokot-S Take 2 tablets by mouth at bedtime.        Follow-up Information     [facility]   Specialty: Family Medicine Why: Please call and make an appointment to establish a primary care doctor [address]: [id number] [place]-[phone]    Vu, Jabier Mutton Follow up on 03/15/2021.   Specialty: Infectious Diseases Why: APPOINTMENT AT 11:15 AM Contact information: [phone] [phone]     Methadone clinic Follow up.   Why: Follow-up with the methadone clinic for further doses of methadone.               Allergies  Allergen Reactions   Hydrocodone Itching   Morphine And Related Nausea And Vomiting      Consultations:  ID Ortho IR   Other Procedures/Studies: DG Knee 1-2 Views Left  Result Date: 02/20/2021 CLINICAL DATA:  Swelling.  Fall months ago. EXAM: LEFT KNEE - 1-2 VIEW COMPARISON:  None. FINDINGS: No evidence of fracture, dislocation, or joint effusion. No evidence of arthropathy or other focal bone abnormality. Soft tissues are unremarkable. IMPRESSION: Negative. Electronically Signed   By: KeviRolm Baptise.   On: 02/20/2021 12:23   CT CHEST W CONTRAST  Result Date:  02/18/2021 CLINICAL DATA:  35 y57r old female with history of lung abscess. EXAM: CT CHEST WITH CONTRAST TECHNIQUE: Multidetector CT imaging of the chest was performed during intravenous contrast administration. CONTRAST:  100mL61mIPAQUE IOHEXOL 350 MG/ML SOLN COMPARISON:  Chest CT December 02, 2017. FINDINGS: Cardiovascular: Heart size is enlarged with right ventricular and right atrial dilatation. There is no significant pericardial fluid, thickening or pericardial calcification. Occlusive filling defects in segmental and subsegmental sized branches in the right lower lobe best appreciated on axial images 80-84 of series 3. No atherosclerotic calcifications are noted in the thoracic aorta or the coronary arteries. Right upper extremity PICC with tip terminating in the distal superior vena cava. Mediastinum/Nodes: No pathologically enlarged mediastinal or hilar lymph nodes. At several prominent borderline enlarged mediastinal  lymph nodes are incidentally noted, nonspecific and likely reactive. Esophagus is unremarkable in appearance. No axillary lymphadenopathy. Lungs/Pleura: Widespread areas of nodularity and architectural distortion are again noted throughout the lungs bilaterally, some of which are cavitary (example in the right upper lobe on axial image 45 of series 4). No confluent consolidative airspace disease. No pleural effusions. Widespread areas of septal thickening. Upper Abdomen: Liver is incompletely imaged, but clearly enlarged. Reflux of contrast material into the inferior vena cava and hepatic veins. Pneumobilia, suggesting prior sphincterotomy. Musculoskeletal: Extensive orthopedic fixation hardware throughout the visualized thoracolumbar spine. There are no aggressive appearing lytic or blastic lesions noted in the visualized portions of the skeleton. IMPRESSION: 1. Widespread nodular architectural distortion and septal thickening scattered throughout the lungs bilaterally. Findings likely reflect  sequela of recurrent septic embolization. Which of these areas represents acute septic emboli versus chronic scarring is uncertain. 2. Occlusive embolus in segmental and subsegmental size pulmonary artery branches in the right lower lobe, as above. 3. Cardiomegaly with severe right ventricular and right atrial dilatation. 4. Passive hepatic congestion with severe hepatomegaly. These results will be called to the ordering clinician or representative by the Radiologist Assistant, and communication documented in the PACS or [employer]. Electronically Signed   By: Vinnie Langton M.D.   On: 02/18/2021 10:59   MR PELVIS W WO CONTRAST  Result Date: 01/24/2021 CLINICAL DATA:  History of polysubstance IV drug abuse with recurrent history of endocarditis. 3-4 day history of pelvic pain. EXAM: MRI PELVIS WITHOUT AND WITH CONTRAST TECHNIQUE: Multiplanar multisequence MR imaging of the pelvis was performed both before and after administration of intravenous contrast. CONTRAST:  8.98m GADAVIST GADOBUTROL 1 MMOL/ML IV SOLN COMPARISON:  CT scan 01/19/2021 FINDINGS: Urinary Tract:  There is a Foley catheter in a decompressed bladder. Bowel: The rectum, sigmoid colon and visualized small-bowel loops are grossly. Vascular/Lymphatic: The major vascular structures appear patent. Scattered pelvic and inguinal lymphadenopathy, likely reactive. Reproductive:  The uterus and ovaries. Other:  Small amount of free pelvic fluid is noted. Musculoskeletal: There are findings septic arthritis noted at the pubic symphysis with osteomyelitis involving the pubic bones. There is also adjacent abscess is both anterior and posterior to the pubic symphysis. There is also severe myofasciitis involving the adjacent adductor muscles along with areas of pyomyositis. The hips are normally located. I do not see any findings for septic arthritis involving the hips or the SI joints. IMPRESSION: 1. MR findings consistent with septic arthritis at the  pubic symphysis with osteomyelitis involving the pubic bones and abscesses surrounding the pubic bones. 2. Severe myofasciitis involving the adjacent adductor muscles along with areas of pyomyositis. 3. No findings for septic arthritis involving the hips or SI joints. 4. Free pelvic fluid but no intrapelvic abscess. Electronically Signed   By: PMarijo SanesM.D.   On: 01/24/2021 21:50   CT ABDOMEN PELVIS W CONTRAST  Result Date: 02/10/2021 CLINICAL DATA:  Suprapubic abdominal pain for 3 days, septic arthritis of the pubic symphysis EXAM: CT ABDOMEN AND PELVIS WITH CONTRAST TECHNIQUE: Multidetector CT imaging of the abdomen and pelvis was performed using the standard protocol following bolus administration of intravenous contrast. CONTRAST:  1045mOMNIPAQUE IOHEXOL 300 MG/ML  SOLN COMPARISON:  CT abdomen pelvis, 01/19/2021, MR pelvis, 01/24/2021 FINDINGS: Lower chest: No acute abnormality. Hepatobiliary: Enlarged, heterogeneously hypodense liver, maximum coronal span 24.1 cm. Status post cholecystectomy. No biliary ductal dilatation Pancreas: Unremarkable. No pancreatic ductal dilatation or surrounding inflammatory changes. Spleen: Normal in size without significant abnormality. Adrenals/Urinary  Tract: Adrenal glands are unremarkable. Kidneys are normal, without renal calculi, solid lesion, or hydronephrosis. Bladder is unremarkable. Stomach/Bowel: Stomach is within normal limits. Appendix appears normal. No evidence of bowel wall thickening, distention, or inflammatory changes. Vascular/Lymphatic: No significant vascular findings are present. No enlarged abdominal or pelvic lymph nodes. Reproductive: No mass or other significant abnormality. Other: No abdominal wall hernia or abnormality. No abdominopelvic ascites. Musculoskeletal: There is redemonstrated diastasis of the pubic symphysis (series 3, image 83) with a fluid collection about the joint space measuring approximately 5.2 x 3.4 cm, with an additional  component about the anterior right aspect of the pubic symphysis measuring 1.6 x 1.0 cm (series 3, image 82). Findings are very similar to prior MR dated 01/24/2021. Partially imaged thoracolumbar rod fusion. IMPRESSION: 1. There is redemonstrated diastasis of the pubic symphysis with a fluid collection about the joint space measuring approximately 5.2 x 3.4 cm, with an additional component about the anterior right aspect of the pubic symphysis measuring 1.6 x 1.0 cm. Findings are very similar to prior MR dated 01/24/2021 and consistent with septic arthritis/osteomyelitis. 2. Hepatomegaly and hepatic steatosis. 3. Status post cholecystectomy. Electronically Signed   By: Delanna Ahmadi M.D.   On: 02/10/2021 14:09   CT ASPIRATION  Result Date: 01/26/2021 CLINICAL DATA:  septic arthritis at the pubic symphysis with osteomyelitis involving the pubic bones and abscesses surrounding the pubic bones. EXAM: CT GUIDED ASPIRATION BIOPSY OF PUBIC SYMPHYSIS ANESTHESIA/SEDATION: Intravenous Fentanyl 174mg and Versed 210mwere administered as conscious sedation during continuous monitoring of the patient's level of consciousness and physiological / cardiorespiratory status by the radiology RN, with a total moderate sedation time of 12 minutes. PROCEDURE: The procedure risks, benefits, and alternatives were explained to the patient. Questions regarding the procedure were encouraged and answered. The patient understands and consents to the procedure. Select axial scans through the pelvis obtained. Pubic symphysis localized and an appropriate skin entry site determined and marked. The operative field was prepped with chlorhexidinein a sterile fashion, and a sterile drape was applied covering the operative field. A sterile gown and sterile gloves were used for the procedure. Local anesthesia was provided with 1% Lidocaine. Under CT fluoroscopic guidance, 17 gauge percutaneous entry needle advanced to the pubic symphysis.  Aspiration returned less than 2 mL bloody purulent material, sent for the requested laboratory studies. The patient tolerated the procedure well. COMPLICATIONS: None immediate FINDINGS: Pubic symphysis and surrounding soft tissue changes localized. Needle aspiration sample obtained without complication. IMPRESSION: 1. Technically successful CT-guided aspiration, pubic symphysis. Electronically Signed   By: D Lucrezia Europe.D.   On: 01/26/2021 11:02   VAS [place]OWER EXTREMITY VENOUS (DVT)  Result Date: 02/20/2021  Lower Venous DVT Study Patient Name:  CALAYLONIE MARZECDate of Exam:   02/19/2021 Medical Rec #: [id number]         Accession #:    [id number] of Birth: [birth year]          Patient Gender: F Patient Age:   [age]ears [facility]:      VAS [place] EXTREMITY VENOUS (DVT) Referring Phys: JEEulogio Bear-------------------------------------------------------------------------------  Indications: Pain, and pulmonary embolism. Other Indications: Sepsis secondary to MRSA bacteremia and osteomyelitis/abscess                    involving the pubic symphysis. Risk Factors: IV drug abuse, recurrent endocarditis. Comparison Study: Prior negative left LEV done 01/16/2018 Performing Technologist:  Sharion Dove RVS  Examination Guidelines: A complete evaluation includes B-mode imaging, spectral Doppler, color Doppler, and power Doppler as needed of all accessible portions of each vessel. Bilateral testing is considered an integral part of a complete examination. Limited examinations for reoccurring indications may be performed as noted. The reflux portion of the exam is performed with the patient in reverse Trendelenburg.  +---------+---------------+---------+-----------+----------+-------------------+ RIGHT    CompressibilityPhasicitySpontaneityPropertiesThrombus Aging      +---------+---------------+---------+-----------+----------+-------------------+ CFV      Full                                          pulsatile waveforms +---------+---------------+---------+-----------+----------+-------------------+ SFJ      Full                                                             +---------+---------------+---------+-----------+----------+-------------------+ FV Prox  Full                                                             +---------+---------------+---------+-----------+----------+-------------------+ FV Mid   Full                                                             +---------+---------------+---------+-----------+----------+-------------------+ FV DistalFull                                                             +---------+---------------+---------+-----------+----------+-------------------+ PFV      Full                                                             +---------+---------------+---------+-----------+----------+-------------------+ POP      Full                                         pulsatile waveforms +---------+---------------+---------+-----------+----------+-------------------+ PTV      Full                                                             +---------+---------------+---------+-----------+----------+-------------------+ PERO     Full                                                             +---------+---------------+---------+-----------+----------+-------------------+   +---------+---------------+---------+-----------+----------+-------------------+  LEFT     CompressibilityPhasicitySpontaneityPropertiesThrombus Aging      +---------+---------------+---------+-----------+----------+-------------------+ CFV      Full                                         pulsatile waveforms +---------+---------------+---------+-----------+----------+-------------------+ SFJ      Full                                                              +---------+---------------+---------+-----------+----------+-------------------+ FV Prox  Full                                                             +---------+---------------+---------+-----------+----------+-------------------+ FV Mid   Full                                                             +---------+---------------+---------+-----------+----------+-------------------+ FV DistalFull                                                             +---------+---------------+---------+-----------+----------+-------------------+ PFV      Full                                                             +---------+---------------+---------+-----------+----------+-------------------+ POP      Full                                         pulsatile waveforms +---------+---------------+---------+-----------+----------+-------------------+ PTV      Full                                                             +---------+---------------+---------+-----------+----------+-------------------+ PERO     Full                                                             +---------+---------------+---------+-----------+----------+-------------------+     Summary: RIGHT: - There is an anechoic cyst-like structure noted in the proximal to mid thigh, measuring  0.91 cm X 1.20 cm. Etiology unknown   *See table(s) above for measurements and observations. Electronically signed by Orlie Pollen on 02/20/2021 at 6:48:21 AM.    Final    [place] EKG SITE RITE  Result Date: 02/01/2021 If Site Rite image not attached, placement could not be confirmed due to current cardiac rhythm.  [place] EKG SITE RITE  Result Date: 01/30/2021 If Site Rite image not attached, placement could not be confirmed due to current cardiac rhythm.    TODAY-DAY OF DISCHARGE:  Subjective:   Kelly [name] today has no headache,no chest abdominal pain,no new weakness tingling or numbness, feels much  better wants to go home today.  Objective:   Blood pressure 100/76, pulse 67, temperature 98.4 F (36.9 C), temperature source Oral, resp. rate 20, height 5' 7"  (1.702 m), weight 72.4 kg, SpO2 96 %, not currently breastfeeding. No intake or output data in the 24 hours ending 02/22/21 1030 Filed Weights   01/19/21 0014 01/22/21 0534 02/11/21 0349  Weight: 81.6 kg 82 kg 72.4 kg    Exam: Awake Alert, Oriented *3, No new F.N deficits, Normal affect [place].AT,PERRAL Supple Neck,No JVD, No cervical lymphadenopathy appriciated.  Symmetrical Chest wall movement, Good air movement bilaterally, CTAB RRR,No Gallops,Rubs or new Murmurs, No Parasternal Heave +ve B.Sounds, Abd Soft, Non tender, No organomegaly appriciated, No rebound -guarding or rigidity. No Cyanosis, Clubbing or edema, No new Rash or bruise   PERTINENT RADIOLOGIC STUDIES: DG Knee 1-2 Views Left  Result Date: 02/20/2021 CLINICAL DATA:  Swelling.  Fall months ago. EXAM: LEFT KNEE - 1-2 VIEW COMPARISON:  None. FINDINGS: No evidence of fracture, dislocation, or joint effusion. No evidence of arthropathy or other focal bone abnormality. Soft tissues are unremarkable. IMPRESSION: Negative. Electronically Signed   By: Rolm Baptise M.D.   On: 02/20/2021 12:23     PERTINENT LAB RESULTS: CBC: Recent Labs    02/22/21 0340  WBC 6.8  HGB 8.7*  HCT 30.2*  PLT 373   CMET CMP     Component Value Date/Time   NA 135 02/22/2021 0340   NA 136 07/02/2013 0547   K 4.3 02/22/2021 0340   K 3.7 07/02/2013 0547   CL 97 (L) 02/22/2021 0340   CL 102 07/02/2013 0547   CO2 29 02/22/2021 0340   CO2 30 07/02/2013 0547   GLUCOSE 96 02/22/2021 0340   GLUCOSE 86 07/28/2020 0626   BUN 27 (H) 02/22/2021 0340   BUN 7 07/02/2013 0547   CREATININE 0.54 02/22/2021 0340   CREATININE 0.82 07/02/2013 0547   CALCIUM 9.5 02/22/2021 0340   CALCIUM 8.8 07/02/2013 0547   PROT 6.2 (L) 01/26/2021 0506   PROT 7.7 07/10/2013 1121   ALBUMIN 1.5 (L)  01/26/2021 0506   ALBUMIN 3.4 07/10/2013 1121   AST 10 (L) 01/26/2021 0506   AST 115 (H) 07/10/2013 1121   ALT <5 01/26/2021 0506   ALT 95 (H) 07/10/2013 1121   ALKPHOS 103 01/26/2021 0506   ALKPHOS 120 (H) 07/10/2013 1121   BILITOT 0.4 01/26/2021 0506   BILITOT 1.9 (H) 07/10/2013 1121   GFRNONAA >60 02/22/2021 0340   GFRNONAA >60 07/02/2013 0547   GFRAA >60 06/26/2019 1812   GFRAA >60 07/02/2013 0547    GFR Estimated Creatinine Clearance: 94.5 mL/min (by C-G formula based on SCr of 0.54 mg/dL). No results for input(s): LIPASE, AMYLASE in the last 72 hours. Recent Labs    02/22/21 0340  CKTOTAL 16*   Invalid input(s): POCBNP No results for input(s):  DDIMER in the last 72 hours. No results for input(s): HGBA1C in the last 72 hours. No results for input(s): CHOL, HDL, LDLCALC, TRIG, CHOLHDL, LDLDIRECT in the last 72 hours. No results for input(s): TSH, T4TOTAL, T3FREE, THYROIDAB in the last 72 hours.  Invalid input(s): FREET3 No results for input(s): VITAMINB12, FOLATE, FERRITIN, TIBC, IRON, RETICCTPCT in the last 72 hours. Coags: No results for input(s): INR in the last 72 hours.  Invalid input(s): PT Microbiology: No results found for this or any previous visit (from the past 240 hour(s)).  FURTHER DISCHARGE INSTRUCTIONS:  Get Medicines reviewed and adjusted: Please take all your medications with you for your next visit with your Primary MD  Laboratory/radiological data: Please request your Primary MD to go over all hospital tests and procedure/radiological results at the follow up, please ask your Primary MD to get all Hospital records sent to his/her office.  In some cases, they will be blood work, cultures and biopsy results pending at the time of your discharge. Please request that your primary care M.D. goes through all the records of your hospital data and follows up on these results.  Also Note the following: If you experience worsening of your admission  symptoms, develop shortness of breath, life threatening emergency, suicidal or homicidal thoughts you must seek medical attention immediately by calling 911 or calling your MD immediately  if symptoms less severe.  You must read complete instructions/literature along with all the possible adverse reactions/side effects for all the Medicines you take and that have been prescribed to you. Take any new Medicines after you have completely understood and accpet all the possible adverse reactions/side effects.   Do not drive when taking Pain medications or sleeping medications (Benzodaizepines)  Do not take more than prescribed Pain, Sleep and Anxiety Medications. It is not advisable to combine anxiety,sleep and pain medications without talking with your primary care practitioner  Special Instructions: If you have smoked or chewed Tobacco  in the last 2 yrs please stop smoking, stop any regular Alcohol  and or any Recreational drug use.  Wear Seat belts while driving.  Please note: You were cared for by a hospitalist during your hospital stay. Once you are discharged, your primary care physician will handle any further medical issues. Please note that NO REFILLS for any discharge medications will be authorized once you are discharged, as it is imperative that you return to your primary care physician (or establish a relationship with a primary care physician if you do not have one) for your post hospital discharge needs so that they can reassess your need for medications and monitor your lab values.  Total Time spent coordinating discharge including counseling, education and face to face time equals 35 minutes.  Signed: Kandee Escalante 02/22/2021 10:30 AM

## 2021-02-23 ENCOUNTER — Telehealth (HOSPITAL_COMMUNITY): Payer: Self-pay | Admitting: Licensed Clinical Social Worker

## 2021-02-23 NOTE — Telephone Encounter (Signed)
CSW received message from inpatient [name] team that pt is struggling with getting transportation to methadone appt tomorrow.  CSW attempted to call but unable to reach- left VM requesting return call to discuss  Will continue to follow and assist as needed  Kelly [name], [place] Worker [facility] Desk#: [phone] Cell#: [phone]

## 2021-03-06 ENCOUNTER — Telehealth (HOSPITAL_COMMUNITY): Payer: Self-pay

## 2021-03-06 ENCOUNTER — Other Ambulatory Visit (HOSPITAL_COMMUNITY): Payer: Self-pay

## 2021-03-06 NOTE — Telephone Encounter (Signed)
Transitions of Care Pharmacy   Call attempted for a pharmacy transitions of care follow-up. Unable to leave voicemail.  Call attempt #1. Will follow-up in 2-3 days.    

## 2021-03-07 ENCOUNTER — Telehealth (HOSPITAL_COMMUNITY): Payer: Self-pay

## 2021-03-07 NOTE — Telephone Encounter (Signed)
Transitions of Care Pharmacy   Call attempted for a pharmacy transitions of care follow-up. Unable to leave voicemail.   Call attempt #2. Will follow-up in 2-3 days.    

## 2021-03-08 ENCOUNTER — Telehealth (HOSPITAL_COMMUNITY): Payer: Self-pay

## 2021-03-08 NOTE — Telephone Encounter (Signed)
Transitions of Care Pharmacy   Call attempted for a pharmacy transitions of care follow-up. Unable to leave voicemail.  Call attempt #3. Will no longer attempt follow up for [facility] pharmacy

## 2021-03-15 ENCOUNTER — Inpatient Hospital Stay: Payer: Medicaid Other | Admitting: Internal Medicine

## 2023-12-03 ENCOUNTER — Inpatient Hospital Stay (HOSPITAL_COMMUNITY)
Admission: EM | Admit: 2023-12-03 | Discharge: 2023-12-15 | DRG: 853 | Disposition: A | Discharge status: Home / Self Care | Payer: MEDICAID | Attending: Internal Medicine | Admitting: Internal Medicine

## 2023-12-03 ENCOUNTER — Other Ambulatory Visit: Payer: Self-pay

## 2023-12-03 ENCOUNTER — Inpatient Hospital Stay (HOSPITAL_COMMUNITY): Payer: MEDICAID | Admitting: Anesthesiology

## 2023-12-03 ENCOUNTER — Encounter (HOSPITAL_COMMUNITY): Payer: Self-pay | Admitting: Emergency Medicine

## 2023-12-03 ENCOUNTER — Inpatient Hospital Stay (HOSPITAL_COMMUNITY): Payer: MEDICAID

## 2023-12-03 ENCOUNTER — Emergency Department (HOSPITAL_COMMUNITY): Payer: MEDICAID

## 2023-12-03 ENCOUNTER — Encounter (HOSPITAL_COMMUNITY)
Admission: EM | Disposition: A | Discharge status: Home / Self Care | Payer: Self-pay | Source: Home / Self Care | Attending: Internal Medicine

## 2023-12-03 DIAGNOSIS — Z87891 Personal history of nicotine dependence: Secondary | ICD-10-CM

## 2023-12-03 DIAGNOSIS — A419 Sepsis, unspecified organism: Secondary | ICD-10-CM | POA: Diagnosis not present

## 2023-12-03 DIAGNOSIS — L02416 Cutaneous abscess of left lower limb: Secondary | ICD-10-CM | POA: Diagnosis present

## 2023-12-03 DIAGNOSIS — Z8739 Personal history of other diseases of the musculoskeletal system and connective tissue: Secondary | ICD-10-CM

## 2023-12-03 DIAGNOSIS — I5023 Acute on chronic systolic (congestive) heart failure: Secondary | ICD-10-CM | POA: Diagnosis not present

## 2023-12-03 DIAGNOSIS — A400 Sepsis due to streptococcus, group A: Secondary | ICD-10-CM | POA: Diagnosis present

## 2023-12-03 DIAGNOSIS — F319 Bipolar disorder, unspecified: Secondary | ICD-10-CM | POA: Diagnosis present

## 2023-12-03 DIAGNOSIS — N179 Acute kidney failure, unspecified: Secondary | ICD-10-CM | POA: Diagnosis present

## 2023-12-03 DIAGNOSIS — M726 Necrotizing fasciitis: Secondary | ICD-10-CM | POA: Diagnosis present

## 2023-12-03 DIAGNOSIS — Z5982 Transportation insecurity: Secondary | ICD-10-CM

## 2023-12-03 DIAGNOSIS — Z8249 Family history of ischemic heart disease and other diseases of the circulatory system: Secondary | ICD-10-CM | POA: Diagnosis not present

## 2023-12-03 DIAGNOSIS — B1920 Unspecified viral hepatitis C without hepatic coma: Secondary | ICD-10-CM | POA: Diagnosis present

## 2023-12-03 DIAGNOSIS — F1190 Opioid use, unspecified, uncomplicated: Secondary | ICD-10-CM | POA: Diagnosis present

## 2023-12-03 DIAGNOSIS — E872 Acidosis, unspecified: Secondary | ICD-10-CM | POA: Diagnosis present

## 2023-12-03 DIAGNOSIS — L02414 Cutaneous abscess of left upper limb: Secondary | ICD-10-CM | POA: Diagnosis present

## 2023-12-03 DIAGNOSIS — I071 Rheumatic tricuspid insufficiency: Secondary | ICD-10-CM | POA: Diagnosis present

## 2023-12-03 DIAGNOSIS — L0390 Cellulitis, unspecified: Secondary | ICD-10-CM | POA: Diagnosis not present

## 2023-12-03 DIAGNOSIS — F1490 Cocaine use, unspecified, uncomplicated: Secondary | ICD-10-CM | POA: Diagnosis present

## 2023-12-03 DIAGNOSIS — R012 Other cardiac sounds: Secondary | ICD-10-CM | POA: Diagnosis not present

## 2023-12-03 DIAGNOSIS — E871 Hypo-osmolality and hyponatremia: Secondary | ICD-10-CM | POA: Diagnosis present

## 2023-12-03 DIAGNOSIS — I110 Hypertensive heart disease with heart failure: Secondary | ICD-10-CM

## 2023-12-03 DIAGNOSIS — Z560 Unemployment, unspecified: Secondary | ICD-10-CM

## 2023-12-03 DIAGNOSIS — I272 Pulmonary hypertension, unspecified: Secondary | ICD-10-CM | POA: Diagnosis present

## 2023-12-03 DIAGNOSIS — M79605 Pain in left leg: Secondary | ICD-10-CM | POA: Diagnosis present

## 2023-12-03 DIAGNOSIS — L03116 Cellulitis of left lower limb: Secondary | ICD-10-CM | POA: Diagnosis present

## 2023-12-03 DIAGNOSIS — D638 Anemia in other chronic diseases classified elsewhere: Secondary | ICD-10-CM | POA: Diagnosis present

## 2023-12-03 DIAGNOSIS — Z8614 Personal history of Methicillin resistant Staphylococcus aureus infection: Secondary | ICD-10-CM

## 2023-12-03 DIAGNOSIS — Z86711 Personal history of pulmonary embolism: Secondary | ICD-10-CM

## 2023-12-03 DIAGNOSIS — Z9049 Acquired absence of other specified parts of digestive tract: Secondary | ICD-10-CM

## 2023-12-03 DIAGNOSIS — L03114 Cellulitis of left upper limb: Secondary | ICD-10-CM | POA: Diagnosis not present

## 2023-12-03 DIAGNOSIS — R6520 Severe sepsis without septic shock: Secondary | ICD-10-CM | POA: Diagnosis present

## 2023-12-03 DIAGNOSIS — E876 Hypokalemia: Secondary | ICD-10-CM | POA: Diagnosis present

## 2023-12-03 DIAGNOSIS — Z885 Allergy status to narcotic agent status: Secondary | ICD-10-CM

## 2023-12-03 DIAGNOSIS — Z833 Family history of diabetes mellitus: Secondary | ICD-10-CM | POA: Diagnosis not present

## 2023-12-03 DIAGNOSIS — L02612 Cutaneous abscess of left foot: Secondary | ICD-10-CM | POA: Diagnosis present

## 2023-12-03 DIAGNOSIS — I5022 Chronic systolic (congestive) heart failure: Secondary | ICD-10-CM | POA: Diagnosis present

## 2023-12-03 DIAGNOSIS — B950 Streptococcus, group A, as the cause of diseases classified elsewhere: Secondary | ICD-10-CM | POA: Diagnosis not present

## 2023-12-03 DIAGNOSIS — F1910 Other psychoactive substance abuse, uncomplicated: Secondary | ICD-10-CM | POA: Insufficient documentation

## 2023-12-03 DIAGNOSIS — Z8619 Personal history of other infectious and parasitic diseases: Secondary | ICD-10-CM

## 2023-12-03 HISTORY — PX: FASCIOTOMY: SHX132

## 2023-12-03 LAB — CBC WITH DIFFERENTIAL/PLATELET
Abs Immature Granulocytes: 0 K/uL (ref 0.00–0.07)
Basophils Absolute: 0 K/uL (ref 0.0–0.1)
Basophils Relative: 0 %
Eosinophils Absolute: 0 K/uL (ref 0.0–0.5)
Eosinophils Relative: 0 %
HCT: 33.6 % — ABNORMAL LOW (ref 36.0–46.0)
Hemoglobin: 10.8 g/dL — ABNORMAL LOW (ref 12.0–15.0)
Lymphocytes Relative: 2 %
Lymphs Abs: 0.6 K/uL — ABNORMAL LOW (ref 0.7–4.0)
MCH: 27.1 pg (ref 26.0–34.0)
MCHC: 32.1 g/dL (ref 30.0–36.0)
MCV: 84.2 fL (ref 80.0–100.0)
Monocytes Absolute: 0 K/uL — ABNORMAL LOW (ref 0.1–1.0)
Monocytes Relative: 0 %
Neutro Abs: 27 K/uL — ABNORMAL HIGH (ref 1.7–7.7)
Neutrophils Relative %: 98 %
Platelets: 279 K/uL (ref 150–400)
RBC: 3.99 MIL/uL (ref 3.87–5.11)
RDW: 14.4 % (ref 11.5–15.5)
WBC: 27.6 K/uL — ABNORMAL HIGH (ref 4.0–10.5)
nRBC: 0 % (ref 0.0–0.2)
nRBC: 0 /100{WBCs}

## 2023-12-03 LAB — CBC
HCT: 28.7 % — ABNORMAL LOW (ref 36.0–46.0)
Hemoglobin: 9.2 g/dL — ABNORMAL LOW (ref 12.0–15.0)
MCH: 27 pg (ref 26.0–34.0)
MCHC: 32.1 g/dL (ref 30.0–36.0)
MCV: 84.2 fL (ref 80.0–100.0)
Platelets: 230 K/uL (ref 150–400)
RBC: 3.41 MIL/uL — ABNORMAL LOW (ref 3.87–5.11)
RDW: 14.5 % (ref 11.5–15.5)
WBC: 18.5 K/uL — ABNORMAL HIGH (ref 4.0–10.5)
nRBC: 0 % (ref 0.0–0.2)

## 2023-12-03 LAB — COMPREHENSIVE METABOLIC PANEL WITH GFR
ALT: 10 U/L (ref 0–44)
AST: 29 U/L (ref 15–41)
Albumin: 2.6 g/dL — ABNORMAL LOW (ref 3.5–5.0)
Alkaline Phosphatase: 121 U/L (ref 38–126)
Anion gap: 14 (ref 5–15)
BUN: 46 mg/dL — ABNORMAL HIGH (ref 6–20)
CO2: 19 mmol/L — ABNORMAL LOW (ref 22–32)
Calcium: 8.5 mg/dL — ABNORMAL LOW (ref 8.9–10.3)
Chloride: 94 mmol/L — ABNORMAL LOW (ref 98–111)
Creatinine, Ser: 2.3 mg/dL — ABNORMAL HIGH (ref 0.44–1.00)
GFR, Estimated: 27 mL/min — ABNORMAL LOW (ref >60)
Glucose, Bld: 111 mg/dL — ABNORMAL HIGH (ref 70–99)
Potassium: 3.1 mmol/L — ABNORMAL LOW (ref 3.5–5.1)
Sodium: 127 mmol/L — ABNORMAL LOW (ref 135–145)
Total Bilirubin: 1 mg/dL (ref 0.0–1.2)
Total Protein: 6.7 g/dL (ref 6.5–8.1)

## 2023-12-03 LAB — BASIC METABOLIC PANEL WITH GFR
Anion gap: 16 — ABNORMAL HIGH (ref 5–15)
BUN: 39 mg/dL — ABNORMAL HIGH (ref 6–20)
CO2: 18 mmol/L — ABNORMAL LOW (ref 22–32)
Calcium: 8.1 mg/dL — ABNORMAL LOW (ref 8.9–10.3)
Chloride: 100 mmol/L (ref 98–111)
Creatinine, Ser: 1.55 mg/dL — ABNORMAL HIGH (ref 0.44–1.00)
GFR, Estimated: 43 mL/min — ABNORMAL LOW (ref >60)
Glucose, Bld: 79 mg/dL (ref 70–99)
Potassium: 3 mmol/L — ABNORMAL LOW (ref 3.5–5.1)
Sodium: 134 mmol/L — ABNORMAL LOW (ref 135–145)

## 2023-12-03 LAB — RESP PANEL BY RT-PCR (RSV, FLU A&B, COVID)  RVPGX2
Influenza A by PCR: NEGATIVE
Influenza B by PCR: NEGATIVE
Resp Syncytial Virus by PCR: NEGATIVE
SARS Coronavirus 2 by RT PCR: NEGATIVE

## 2023-12-03 LAB — BRAIN NATRIURETIC PEPTIDE: B Natriuretic Peptide: 294.6 pg/mL — ABNORMAL HIGH (ref 0.0–100.0)

## 2023-12-03 LAB — SURGICAL PCR SCREEN
MRSA, PCR: POSITIVE — AB
Staphylococcus aureus: POSITIVE — AB

## 2023-12-03 LAB — I-STAT CG4 LACTIC ACID, ED
Lactic Acid, Venous: 2.1 mmol/L (ref 0.5–1.9)
Lactic Acid, Venous: 1.8 mmol/L (ref 0.5–1.9)

## 2023-12-03 LAB — HCG, SERUM, QUALITATIVE: Preg, Serum: NEGATIVE

## 2023-12-03 LAB — PROTIME-INR
INR: 1 (ref 0.8–1.2)
Prothrombin Time: 13.6 s (ref 11.4–15.2)

## 2023-12-03 LAB — MAGNESIUM: Magnesium: 1 mg/dL — ABNORMAL LOW (ref 1.7–2.4)

## 2023-12-03 SURGERY — FASCIOTOMY, CALF
Anesthesia: General | Laterality: Left

## 2023-12-03 MED ORDER — MIDAZOLAM HCL 2 MG/2ML IJ SOLN
INTRAMUSCULAR | Status: DC | PRN
  Administered 2023-12-03: 1 mg via INTRAVENOUS

## 2023-12-03 MED ORDER — CHLORHEXIDINE GLUCONATE 0.12 % MT SOLN
OROMUCOSAL | Status: AC
  Administered 2023-12-03: 15 mL via OROMUCOSAL
  Filled 2023-12-03: qty 15

## 2023-12-03 MED ORDER — PIPERACILLIN-TAZOBACTAM 3.375 G IVPB 30 MIN
3.3750 g | Freq: Once | INTRAVENOUS | Status: AC
  Administered 2023-12-03: 3.375 g via INTRAVENOUS
  Filled 2023-12-03: qty 50

## 2023-12-03 MED ORDER — FENTANYL CITRATE (PF) 250 MCG/5ML IJ SOLN
INTRAMUSCULAR | Status: DC | PRN
  Administered 2023-12-03 (×2): 50 ug via INTRAVENOUS
  Administered 2023-12-03: 100 ug via INTRAVENOUS

## 2023-12-03 MED ORDER — CHLORHEXIDINE GLUCONATE 0.12 % MT SOLN: 15.0000 mL | Freq: Once | OROMUCOSAL | Status: AC

## 2023-12-03 MED ORDER — DEXMEDETOMIDINE HCL IN NACL 80 MCG/20ML IV SOLN
INTRAVENOUS | Status: AC
Start: 2023-12-03 — End: 2023-12-03
  Filled 2023-12-03: qty 20

## 2023-12-03 MED ORDER — PROPOFOL 10 MG/ML IV BOLUS
INTRAVENOUS | Status: DC | PRN
  Administered 2023-12-03: 150 mg via INTRAVENOUS

## 2023-12-03 MED ORDER — VANCOMYCIN VARIABLE DOSE PER UNSTABLE RENAL FUNCTION (PHARMACIST DOSING)
Status: DC
  Filled 2023-12-03: qty 1

## 2023-12-03 MED ORDER — OXYCODONE HCL 5 MG PO TABS
5.0000 mg | ORAL_TABLET | Freq: Once | ORAL | Status: AC | PRN
  Administered 2023-12-03: 5 mg via ORAL

## 2023-12-03 MED ORDER — LINEZOLID 600 MG/300ML IV SOLN
600.0000 mg | Freq: Two times a day (BID) | INTRAVENOUS | Status: DC
  Administered 2023-12-03 – 2023-12-10 (×15): 600 mg via INTRAVENOUS
  Filled 2023-12-03 (×15): qty 300

## 2023-12-03 MED ORDER — IOHEXOL 350 MG/ML SOLN
75.0000 mL | Freq: Once | INTRAVENOUS | Status: AC | PRN
  Administered 2023-12-03: 75 mL via INTRAVENOUS

## 2023-12-03 MED ORDER — MAGNESIUM SULFATE 4 GM/100ML IV SOLN
4.0000 g | Freq: Once | INTRAVENOUS | Status: AC
  Administered 2023-12-03: 4 g via INTRAVENOUS
  Filled 2023-12-03: qty 100

## 2023-12-03 MED ORDER — LACTATED RINGERS IV BOLUS
1000.0000 mL | Freq: Once | INTRAVENOUS | Status: AC
  Administered 2023-12-03: 1000 mL via INTRAVENOUS

## 2023-12-03 MED ORDER — FENTANYL CITRATE (PF) 250 MCG/5ML IJ SOLN
INTRAMUSCULAR | Status: AC
Start: 2023-12-03 — End: 2023-12-03
  Filled 2023-12-03: qty 5

## 2023-12-03 MED ORDER — MUPIROCIN 2 % EX OINT
1.0000 | TOPICAL_OINTMENT | Freq: Two times a day (BID) | CUTANEOUS | Status: AC
  Administered 2023-12-03 – 2023-12-07 (×10): 1 via NASAL
  Filled 2023-12-03: qty 22

## 2023-12-03 MED ORDER — ROCURONIUM BROMIDE 10 MG/ML (PF) SYRINGE
PREFILLED_SYRINGE | INTRAVENOUS | Status: DC | PRN
  Administered 2023-12-03: 30 mg via INTRAVENOUS

## 2023-12-03 MED ORDER — AMISULPRIDE (ANTIEMETIC) 5 MG/2ML IV SOLN: 10.0000 mg | Freq: Once | INTRAVENOUS | Status: DC | PRN

## 2023-12-03 MED ORDER — HYDROMORPHONE HCL 1 MG/ML IJ SOLN
1.0000 mg | Freq: Once | INTRAMUSCULAR | Status: AC
  Administered 2023-12-03: 1 mg via INTRAVENOUS
  Filled 2023-12-03: qty 1

## 2023-12-03 MED ORDER — VANCOMYCIN HCL 10 G IV SOLR
1750.0000 mg | Freq: Once | INTRAVENOUS | Status: DC
  Filled 2023-12-03: qty 17.5

## 2023-12-03 MED ORDER — POTASSIUM CHLORIDE 10 MEQ/100ML IV SOLN: 10.0000 meq | INTRAVENOUS | Status: DC

## 2023-12-03 MED ORDER — LACTATED RINGERS IV SOLN: INTRAVENOUS | Status: DC

## 2023-12-03 MED ORDER — OXYCODONE HCL 5 MG PO TABS
5.0000 mg | ORAL_TABLET | Freq: Four times a day (QID) | ORAL | Status: DC | PRN
  Administered 2023-12-03 – 2023-12-06 (×4): 5 mg via ORAL
  Filled 2023-12-03 (×4): qty 1

## 2023-12-03 MED ORDER — LIDOCAINE 2% (20 MG/ML) 5 ML SYRINGE
INTRAMUSCULAR | Status: AC
  Filled 2023-12-03: qty 5

## 2023-12-03 MED ORDER — CLINDAMYCIN PHOSPHATE 900 MG/50ML IV SOLN
900.0000 mg | Freq: Once | INTRAVENOUS | Status: AC
  Administered 2023-12-03: 900 mg via INTRAVENOUS
  Filled 2023-12-03: qty 50

## 2023-12-03 MED ORDER — CLINDAMYCIN PHOSPHATE 900 MG/50ML IV SOLN
900.0000 mg | Freq: Three times a day (TID) | INTRAVENOUS | Status: DC
  Filled 2023-12-03 (×3): qty 50

## 2023-12-03 MED ORDER — SUGAMMADEX SODIUM 200 MG/2ML IV SOLN
INTRAVENOUS | Status: DC | PRN
  Administered 2023-12-03: 200 mg via INTRAVENOUS

## 2023-12-03 MED ORDER — ONDANSETRON HCL 4 MG/2ML IJ SOLN
4.0000 mg | Freq: Once | INTRAMUSCULAR | Status: AC
  Administered 2023-12-03: 4 mg via INTRAVENOUS
  Filled 2023-12-03: qty 2

## 2023-12-03 MED ORDER — ONDANSETRON HCL 4 MG/2ML IJ SOLN
INTRAMUSCULAR | Status: DC | PRN
  Administered 2023-12-03: 4 mg via INTRAVENOUS

## 2023-12-03 MED ORDER — ACETAMINOPHEN 325 MG PO TABS
650.0000 mg | ORAL_TABLET | Freq: Four times a day (QID) | ORAL | Status: DC | PRN
  Administered 2023-12-04 – 2023-12-15 (×4): 650 mg via ORAL
  Filled 2023-12-03 (×4): qty 2

## 2023-12-03 MED ORDER — ROCURONIUM BROMIDE 10 MG/ML (PF) SYRINGE
PREFILLED_SYRINGE | INTRAVENOUS | Status: AC
  Filled 2023-12-03: qty 10

## 2023-12-03 MED ORDER — DEXMEDETOMIDINE HCL IN NACL 80 MCG/20ML IV SOLN
INTRAVENOUS | Status: DC | PRN
Start: 2023-12-03 — End: 2023-12-03
  Administered 2023-12-03: 10 ug via INTRAVENOUS

## 2023-12-03 MED ORDER — SUCCINYLCHOLINE CHLORIDE 200 MG/10ML IV SOSY
PREFILLED_SYRINGE | INTRAVENOUS | Status: DC | PRN
  Administered 2023-12-03: 120 mg via INTRAVENOUS

## 2023-12-03 MED ORDER — HYDROMORPHONE HCL 1 MG/ML IJ SOLN
INTRAMUSCULAR | Status: AC
  Filled 2023-12-03: qty 1

## 2023-12-03 MED ORDER — HYDROMORPHONE HCL 1 MG/ML IJ SOLN
1.0000 mg | INTRAMUSCULAR | Status: DC | PRN
  Administered 2023-12-03 – 2023-12-08 (×16): 1 mg via INTRAVENOUS
  Filled 2023-12-03 (×19): qty 1

## 2023-12-03 MED ORDER — ORAL CARE MOUTH RINSE: 15.0000 mL | Freq: Once | OROMUCOSAL | Status: AC

## 2023-12-03 MED ORDER — HYDROMORPHONE HCL 1 MG/ML IJ SOLN
INTRAMUSCULAR | Status: AC
  Filled 2023-12-03: qty 2

## 2023-12-03 MED ORDER — MIDAZOLAM HCL 2 MG/2ML IJ SOLN
INTRAMUSCULAR | Status: AC
  Filled 2023-12-03: qty 2

## 2023-12-03 MED ORDER — METOPROLOL TARTRATE 5 MG/5ML IV SOLN: 5.0000 mg | INTRAVENOUS | Status: DC | PRN

## 2023-12-03 MED ORDER — SODIUM CHLORIDE 0.9 % IV SOLN: INTRAVENOUS | Status: AC

## 2023-12-03 MED ORDER — ONDANSETRON HCL 4 MG/2ML IJ SOLN
INTRAMUSCULAR | Status: AC
  Filled 2023-12-03: qty 2

## 2023-12-03 MED ORDER — ACETAMINOPHEN 650 MG RE SUPP: 650.0000 mg | Freq: Four times a day (QID) | RECTAL | Status: DC | PRN

## 2023-12-03 MED ORDER — PROCHLORPERAZINE EDISYLATE 10 MG/2ML IJ SOLN
10.0000 mg | Freq: Four times a day (QID) | INTRAMUSCULAR | Status: DC | PRN
  Administered 2023-12-03: 10 mg via INTRAVENOUS
  Filled 2023-12-03: qty 2

## 2023-12-03 MED ORDER — PIPERACILLIN-TAZOBACTAM 3.375 G IVPB
3.3750 g | Freq: Three times a day (TID) | INTRAVENOUS | Status: DC
  Administered 2023-12-03 – 2023-12-09 (×18): 3.375 g via INTRAVENOUS
  Filled 2023-12-03 (×19): qty 50

## 2023-12-03 MED ORDER — PROPOFOL 10 MG/ML IV BOLUS
INTRAVENOUS | Status: AC
  Filled 2023-12-03: qty 20

## 2023-12-03 MED ORDER — OXYCODONE HCL 5 MG PO TABS
ORAL_TABLET | ORAL | Status: AC
  Filled 2023-12-03: qty 1

## 2023-12-03 MED ORDER — CHLORHEXIDINE GLUCONATE CLOTH 2 % EX PADS
6.0000 | MEDICATED_PAD | Freq: Every day | CUTANEOUS | Status: AC
Start: 2023-12-03 — End: 2023-12-08
  Administered 2023-12-03 – 2023-12-06 (×4): 6 via TOPICAL

## 2023-12-03 MED ORDER — VANCOMYCIN HCL 1750 MG/350ML IV SOLN
1750.0000 mg | Freq: Once | INTRAVENOUS | Status: AC
  Administered 2023-12-03: 1750 mg via INTRAVENOUS
  Filled 2023-12-03: qty 350

## 2023-12-03 MED ORDER — OXYCODONE HCL 5 MG/5ML PO SOLN: 5.0000 mg | Freq: Once | ORAL | Status: AC | PRN

## 2023-12-03 MED ORDER — SODIUM CHLORIDE 0.9 % IV SOLN: INTRAVENOUS | Status: DC

## 2023-12-03 MED ORDER — HYDROMORPHONE HCL 1 MG/ML IJ SOLN
0.2500 mg | INTRAMUSCULAR | Status: DC | PRN
  Administered 2023-12-03: 1 mg via INTRAVENOUS

## 2023-12-03 MED ORDER — MEPERIDINE HCL 25 MG/ML IJ SOLN: 6.2500 mg | INTRAMUSCULAR | Status: DC | PRN

## 2023-12-03 MED ORDER — 0.9 % SODIUM CHLORIDE (POUR BTL) OPTIME
TOPICAL | Status: DC | PRN
  Administered 2023-12-03: 1000 mL

## 2023-12-03 MED ORDER — PHENYLEPHRINE 80 MCG/ML (10ML) SYRINGE FOR IV PUSH (FOR BLOOD PRESSURE SUPPORT)
PREFILLED_SYRINGE | INTRAVENOUS | Status: DC | PRN
  Administered 2023-12-03: 80 ug via INTRAVENOUS

## 2023-12-03 MED ORDER — GLUCAGON HCL RDNA (DIAGNOSTIC) 1 MG IJ SOLR: 1.0000 mg | INTRAMUSCULAR | Status: DC | PRN

## 2023-12-03 MED ORDER — HYDROMORPHONE HCL 1 MG/ML IJ SOLN
0.2500 mg | INTRAMUSCULAR | Status: DC | PRN
  Administered 2023-12-03 (×2): 0.5 mg via INTRAVENOUS
  Administered 2023-12-03: 1 mg via INTRAVENOUS

## 2023-12-03 MED ORDER — NALOXONE HCL 0.4 MG/ML IJ SOLN: 0.4000 mg | INTRAMUSCULAR | Status: DC | PRN

## 2023-12-03 MED ORDER — LIDOCAINE 2% (20 MG/ML) 5 ML SYRINGE
INTRAMUSCULAR | Status: DC | PRN
  Administered 2023-12-03: 60 mg via INTRAVENOUS

## 2023-12-03 MED ORDER — POTASSIUM CHLORIDE CRYS ER 20 MEQ PO TBCR
40.0000 meq | EXTENDED_RELEASE_TABLET | Freq: Once | ORAL | Status: AC
  Administered 2023-12-03: 40 meq via ORAL
  Filled 2023-12-03: qty 2

## 2023-12-03 MED ORDER — IPRATROPIUM-ALBUTEROL 0.5-2.5 (3) MG/3ML IN SOLN: 3.0000 mL | RESPIRATORY_TRACT | Status: DC | PRN

## 2023-12-03 MED ORDER — PNEUMOCOCCAL 20-VAL CONJ VACC 0.5 ML IM SUSY: 0.5000 mL | PREFILLED_SYRINGE | INTRAMUSCULAR | Status: DC | PRN

## 2023-12-03 SURGICAL SUPPLY — 40 items
BAG COUNTER SPONGE SURGICOUNT (BAG) ×1 IMPLANT
BNDG COHESIVE 4X5 TAN STRL LF (GAUZE/BANDAGES/DRESSINGS) ×1 IMPLANT
BNDG ELASTIC 4X5.8 VLCR STR LF (GAUZE/BANDAGES/DRESSINGS) ×1 IMPLANT
BNDG ELASTIC 6INX 5YD STR LF (GAUZE/BANDAGES/DRESSINGS) ×1 IMPLANT
BNDG GAUZE DERMACEA FLUFF 4 (GAUZE/BANDAGES/DRESSINGS) ×2 IMPLANT
BRUSH SCRUB EZ PLAIN DRY (MISCELLANEOUS) ×2 IMPLANT
CANISTER WOUNDNEG PRESSURE 500 (CANNISTER) IMPLANT
CHLORAPREP W/TINT 26 (MISCELLANEOUS) ×1 IMPLANT
COVER SURGICAL LIGHT HANDLE (MISCELLANEOUS) ×2 IMPLANT
DRAPE C-ARM 42X72 X-RAY (DRAPES) IMPLANT
DRAPE C-ARMOR (DRAPES) ×1 IMPLANT
DRAPE IMP U-DRAPE 54X76 (DRAPES) ×2 IMPLANT
DRAPE INCISE IOBAN 66X45 STRL (DRAPES) IMPLANT
DRAPE SURG ORHT 6 SPLT 77X108 (DRAPES) ×2 IMPLANT
DRAPE U-SHAPE 47X51 STRL (DRAPES) ×1 IMPLANT
DRSG MEPITEL 4X7.2 (GAUZE/BANDAGES/DRESSINGS) IMPLANT
DRSG VAC GRANUFOAM LG (GAUZE/BANDAGES/DRESSINGS) IMPLANT
ELECTRODE REM PT RTRN 9FT ADLT (ELECTROSURGICAL) ×1 IMPLANT
GAUZE SPONGE 4X4 12PLY STRL (GAUZE/BANDAGES/DRESSINGS) ×1 IMPLANT
GLOVE BIO SURGEON STRL SZ 6.5 (GLOVE) ×3 IMPLANT
GLOVE BIO SURGEON STRL SZ7.5 (GLOVE) ×4 IMPLANT
GLOVE BIOGEL PI IND STRL 6.5 (GLOVE) ×1 IMPLANT
GLOVE BIOGEL PI IND STRL 7.5 (GLOVE) ×1 IMPLANT
GOWN STRL REUS W/ TWL LRG LVL3 (GOWN DISPOSABLE) ×2 IMPLANT
KIT BASIN OR (CUSTOM PROCEDURE TRAY) ×1 IMPLANT
KIT TURNOVER KIT B (KITS) ×1 IMPLANT
MANIFOLD NEPTUNE II (INSTRUMENTS) ×1 IMPLANT
NDL 22X1.5 STRL (OR ONLY) (MISCELLANEOUS) IMPLANT
NEEDLE 22X1.5 STRL (OR ONLY) (MISCELLANEOUS) IMPLANT
NS IRRIG 1000ML POUR BTL (IV SOLUTION) ×1 IMPLANT
PACK ORTHO EXTREMITY (CUSTOM PROCEDURE TRAY) ×1 IMPLANT
PAD ARMBOARD POSITIONER FOAM (MISCELLANEOUS) ×2 IMPLANT
PADDING CAST COTTON 6X4 STRL (CAST SUPPLIES) ×2 IMPLANT
PADDING CAST SYNTHETIC 6X4 NS (CAST SUPPLIES) IMPLANT
SPONGE T-LAP 18X18 ~~LOC~~+RFID (SPONGE) ×1 IMPLANT
STAPLER SKIN PROX 35W (STAPLE) ×1 IMPLANT
STRIP CLOSURE SKIN 1/2X4 (GAUZE/BANDAGES/DRESSINGS) IMPLANT
TOWEL GREEN STERILE (TOWEL DISPOSABLE) ×2 IMPLANT
TOWEL GREEN STERILE FF (TOWEL DISPOSABLE) ×2 IMPLANT
UNDERPAD 30X36 HEAVY ABSORB (UNDERPADS AND DIAPERS) ×1 IMPLANT

## 2023-12-03 NOTE — H&P (Signed)
 History and Physical    Kelly VANVOORHIS FMW:[id number] DOB: Sep 13, 1985 DOA: 12/03/2023  PCP: Pcp, No  Patient coming from: Home  Chief Complaint: Left leg redness, pain, swelling  HPI: [name] is a 38 y.o. 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 difficile infection, bipolar disorder, hep C, hypertension, anemia presenting with complaints of rapidly progressive left leg erythema, pain, and swelling x 2 days.  She is reporting severe pain in her left leg.  Also has pain and swelling of her left upper extremity with mild erythema.  Reports temperature of 103 F at home.  Patient states she has not injected IV drugs for the past 2 months but does report snorting cocaine 2 days ago.  Denies chest pain or shortness of breath.  Not able to give additional history.  ED Course: Slightly tachycardic but afebrile.  Not hypotensive.  Labs notable for WBC count 27.6, hemoglobin 10.8 (stable), sodium 127, potassium 3.1, chloride 94, bicarb 19, glucose 111, BUN 46, creatinine 2.3 (previously 0.5 on labs done in October 2022), calcium  8.5, albumin 2.6, blood cultures in process, lactic acid 2.1, COVID/influenza/RSV PCR pending.  Chest x-ray showing no active cardiopulmonary disease.  CT of left femur/tibia/fibula/foot showing findings consistent with cellulitis but no abscess or soft tissue gas.  Although imaging is not showing soft tissue gas, there remains concern for possible necrotizing fasciitis given significant leukocytosis and rapidly progressive infection, as such, ED physician has discussed the case with Dr. Kendal from orthopedics who will see the patient in consultation.  Patient was given Dilaudid , Zofran , vancomycin , Zosyn , clindamycin , and 2 L LR boluses.    Review of Systems:  Review of Systems  All  other systems reviewed and are negative.   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 12/05/2017   Abscess of breast    rt breast   Abscess of right breast 07/13/2014   Acute cystitis without hematuria    ADD (attention deficit disorder)    Back pain    Bacteremia due to Enterococcus 06/15/2018   Bipolar 1 disorder (HCC)    Endocarditis 2020   Foreign body granuloma of soft tissue, NEC, right upper arm    History of hepatitis C 10/01/2018   Hypertension    Infection of left wrist (HCC) 10/26/2020   Influenza B 06/17/2018   Leukocytosis    Multifocal pneumonia 06/14/2018   Polysubstance abuse (HCC)    Pyelonephritis affecting pregnancy 05/24/2016   Sepsis (HCC) 12/02/2017   Suicidal overdose (HCC)    Thigh abscess 10/26/2020   Vaginal discharge 10/26/2020    Past Surgical History:  Procedure Laterality Date   BACK SURGERY     scoliosis-in care everywhere 08/1999   BUBBLE STUDY  11/01/2020   Procedure: BUBBLE STUDY;  Surgeon: Kate Lonni CROME, MD;  Location: [facility] ENDOSCOPY;  Service: Cardiovascular;;   CESAREAN SECTION     CHOLECYSTECTOMY  March 2015   FOREIGN BODY REMOVAL Right 05/28/2016   Procedure: REMOVAL FOREIGN BODY RIGHT ARM;  Surgeon: Addie Glendia Hacker, MD;  Location: [facility] OR;  Service: Orthopedics;  Laterality: Right;   I & D EXTREMITY Left 10/26/2020   Procedure: IRRIGATION AND DEBRIDEMENT LEFT WRIST;  Surgeon: Shari Easter, MD;  Location: [facility] OR;  Service: Orthopedics;  Laterality: Left;   INCISION AND DRAINAGE ABSCESS Right 11/01/2020   Procedure:  INCISION AND DRAINAGE ABSCESS RIGHT THIGH X 2;  Surgeon: Teresa Lonni HERO, MD;  Location: [facility] OR;  Service: General;  Laterality: Right;   IRRIGATION AND DEBRIDEMENT ABSCESS Right 07/13/2014   Procedure: IRRIGATION AND DEBRIDEMEN TRIGHT BREAST ABSCESS;  Surgeon: Donnice Lima, MD;  Location: [facility] OR;  Service: General;  Laterality: Right;   LUMBAR LAMINECTOMY/DECOMPRESSION MICRODISCECTOMY  N/A 12/05/2017   Procedure: LUMBAR LAMINECTOMY FOR EPIDURAL ABSCESS;  Surgeon: Mavis Purchase, MD;  Location: [facility] OR;  Service: Neurosurgery;  Laterality: N/A;   MINOR IRRIGATION AND DEBRIDEMENT OF WOUND Left 10/26/2020   Procedure: MINOR IRRIGATION AND DEBRIDEMENT OF LEFT BREAST AND BILATERAL THIGHS;  Surgeon: Shari Easter, MD;  Location: [facility] OR;  Service: Orthopedics;  Laterality: Left;   MULTIPLE EXTRACTIONS WITH ALVEOLOPLASTY Bilateral 07/03/2018   Procedure: Extraction of tooth #'s 30 and 31 with alveoloplasty;  Surgeon: Cyndee Tanda FALCON, DDS;  Location: [facility] OR;  Service: Oral Surgery;  Laterality: Bilateral;   RADIOLOGY WITH ANESTHESIA N/A 12/05/2017   Procedure: MRI WITH ANESTHESIA OF [facility] AND LUMBAR SPINE WITH AND WITHOUT;  Surgeon: Radiologist, Medication, MD;  Location: [facility] OR;  Service: Radiology;  Laterality: N/A;   RIGHT/LEFT HEART CATH AND CORONARY ANGIOGRAPHY N/A 05/21/2019   Procedure: RIGHT/LEFT HEART CATH AND CORONARY ANGIOGRAPHY;  Surgeon: Cherrie Toribio SAUNDERS, MD;  Location: [facility] INVASIVE CV LAB;  Service: Cardiovascular;  Laterality: N/A;   TEE WITHOUT CARDIOVERSION N/A 05/21/2019   Procedure: TRANSESOPHAGEAL ECHOCARDIOGRAM (TEE);  Surgeon: Cherrie Toribio SAUNDERS, MD;  Location: [name] ENDOSCOPY;  Service: Cardiovascular;  Laterality: N/A;   TEE WITHOUT CARDIOVERSION N/A 11/01/2020   Procedure: TRANSESOPHAGEAL ECHOCARDIOGRAM (TEE);  Surgeon: Kate Lonni CROME, MD;  Location: [facility] ENDOSCOPY;  Service: Cardiovascular;  Laterality: N/A;   TUBAL LIGATION Bilateral 09/30/2020   Procedure: POST PARTUM TUBAL LIGATION;  Surgeon: Herchel Gloris LABOR, MD;  Location: [facility] LD ORS;  Service: Gynecology;  Laterality: Bilateral;     reports that she quit smoking about 5 years ago. Her smoking use included cigarettes. She started smoking about 18 years ago. She has a 13 pack-year smoking history. She has never used smokeless tobacco. She reports that she does not currently use alcohol. She reports current  drug use. Drugs: Cocaine, Heroin, IV, and Fentanyl .  Allergies  Allergen Reactions   Hydrocodone  Itching   Morphine  And Codeine Nausea And Vomiting    Family History  Problem Relation Age of Onset   Diabetes Mother    Diabetes Father    Hypertension Father    Heart attack Neg Hx    Hyperlipidemia Neg Hx    Sudden death Neg Hx     Prior to Admission medications   Medication Sig Start Date End Date Taking? Authorizing Provider  APIXABAN  (ELIQUIS ) VTE STARTER PACK (10MG  AND 5MG ) Take as directed on package: start with two-5mg  tablets twice daily for 7 days (until 10/14). On day 8 (on 10/15), switch to one-5mg  tablet twice daily. 02/22/21   Ghimire, Donalda HERO, MD  polyethylene glycol powder (GLYCOLAX /MIRALAX ) 17 GM/SCOOP powder Take 17 g by mouth daily. 02/22/21   Ghimire, Donalda HERO, MD  senna-docusate (SENOKOT-S) 8.6-50 MG tablet Take 2 tablets by mouth at bedtime. 02/22/21   Raenelle Donalda HERO, MD    Physical Exam: Vitals:   12/03/23 0415 12/03/23 0430 12/03/23 0445 12/03/23 0500  BP:   110/67 106/65  Pulse: (!) 110 95    Resp: 16 13 10 12   Temp:      SpO2: 100% 99%      Physical Exam Constitutional:  General: She is not in acute distress. HENT:     Head: Normocephalic and atraumatic.  Eyes:     Extraocular Movements: Extraocular movements intact.  Cardiovascular:     Rate and Rhythm: Normal rate and regular rhythm.     Pulses: Normal pulses.  Pulmonary:     Effort: Pulmonary effort is normal. No respiratory distress.     Breath sounds: Normal breath sounds. No wheezing or rales.  Abdominal:     General: Bowel sounds are normal. There is no distension.     Palpations: Abdomen is soft.     Tenderness: There is no abdominal tenderness. There is no guarding.  Musculoskeletal:     Cervical back: Normal range of motion.     Left lower leg: Edema present.     Comments: Severe swelling and erythema of left lower extremity.  Tender to minimal palpation.  Dorsalis  pedis pulse palpable. Left upper extremity swollen and mildly erythematous.  Radial pulse palpable.  Skin:    General: Skin is warm and dry.  Neurological:     General: No focal deficit present.     Mental Status: She is alert and oriented to person, place, and time.      Labs on Admission: I have personally reviewed following labs and imaging studies  CBC: Recent Labs  Lab 12/03/23 0256  WBC 27.6*  NEUTROABS 27.0*  HGB 10.8*  HCT 33.6*  MCV 84.2  PLT 279   Basic Metabolic Panel: Recent Labs  Lab 12/03/23 0256  NA 127*  K 3.1*  CL 94*  CO2 19*  GLUCOSE 111*  BUN 46*  CREATININE 2.30*  CALCIUM  8.5*   GFR: CrCl cannot be calculated (Unknown ideal weight.). Liver Function Tests: Recent Labs  Lab 12/03/23 0256  AST 29  ALT 10  ALKPHOS 121  BILITOT 1.0  PROT 6.7  ALBUMIN 2.6*   No results for input(s): LIPASE, AMYLASE in the last 168 hours. No results for input(s): AMMONIA in the last 168 hours. Coagulation Profile: Recent Labs  Lab 12/03/23 0256  INR 1.0   Cardiac Enzymes: No results for input(s): CKTOTAL, CKMB, CKMBINDEX, TROPONINI in the last 168 hours. BNP (last 3 results) No results for input(s): PROBNP in the last 8760 hours. HbA1C: No results for input(s): HGBA1C in the last 72 hours. CBG: No results for input(s): GLUCAP in the last 168 hours. Lipid Profile: No results for input(s): CHOL, HDL, LDLCALC, TRIG, CHOLHDL, LDLDIRECT in the last 72 hours. Thyroid  Function Tests: No results for input(s): TSH, T4TOTAL, FREET4, T3FREE, THYROIDAB in the last 72 hours. Anemia Panel: No results for input(s): VITAMINB12, FOLATE, FERRITIN, TIBC, IRON , RETICCTPCT in the last 72 hours. Urine analysis:    Component Value Date/Time   COLORURINE YELLOW 02/07/2021 0903   APPEARANCEUR CLEAR 02/07/2021 0903   APPEARANCEUR Hazy 07/10/2013 1121   LABSPEC 1.018 02/07/2021 0903   LABSPEC 1.020 07/10/2013 1121    PHURINE 6.0 02/07/2021 0903   GLUCOSEU NEGATIVE 02/07/2021 0903   GLUCOSEU Negative 07/10/2013 1121   HGBUR SMALL (A) 02/07/2021 0903   BILIRUBINUR NEGATIVE 02/07/2021 0903   BILIRUBINUR Negative 07/10/2013 1121   KETONESUR NEGATIVE 02/07/2021 0903   PROTEINUR NEGATIVE 02/07/2021 0903   UROBILINOGEN 1.0 02/15/2015 0530   NITRITE NEGATIVE 02/07/2021 0903   LEUKOCYTESUR TRACE (A) 02/07/2021 0903   LEUKOCYTESUR Trace 07/10/2013 1121    Radiological Exams on Admission: CT FOOT LEFT W CONTRAST Result Date: 12/03/2023 EXAM: CT LEFT FOOT, WITH IV CONTRAST 12/03/2023 03:35:58 AM TECHNIQUE: Axial images  were acquired through the left foot with IV contrast. Reformatted images were reviewed. Automated exposure control, iterative reconstruction, and/or weight based adjustment of the mA/kV was utilized to reduce the radiation dose to as low as reasonably achievable. COMPARISON: None available. CLINICAL HISTORY: Osteomyelitis suspected, foot, xray done; ?nec fasc. 75cc [id number]; Pt reports leg edema and redness and left arm swelling and redness. Started 2 days ago but progressed quickly over last few hours. Endorses IV drug use in thigh but states been 2 months. Recently incarcerated but been out for about a month. Hx of endocarditis. FINDINGS: BONES AND JOINTS: No acute fracture or focal osseous lesion. No dislocation. No cortical destruction to suggest osteomyelitis. The joint spaces are normal. SOFT TISSUES: Extensive subcutaneous fluid/edema along the dorsal foot. No drainable fluid collection/abscess. No soft tissue gas to suggest necrotizing fasciitis. IMPRESSION: 1. Extensive subcutaneous fluid/edema along the dorsal foot. 2. No drainable fluid collection/abscess. 3. No soft tissue gas to suggest necrotizing fasciitis. Electronically signed by: Pinkie Pebbles MD 12/03/2023 03:49 AM EDT RP Workstation: [id number]   CT TIBIA FIBULA LEFT W CONTRAST Result Date: 12/03/2023 EXAM: CT Left Lower Extremity,  With IV Contrast 12/03/2023 03:35:58 AM TECHNIQUE: Axial images were acquired through the left lower extremity with IV contrast. Reformatted images were reviewed. Automated exposure control, iterative reconstruction, and/or weight based adjustment of the mA/kV was utilized to reduce the radiation dose to as low as reasonably achievable. COMPARISON: None available. CLINICAL HISTORY: Soft tissue infection suspected, lower leg, xray done. Pt reports leg edema and redness and left arm swelling and redness. Started 2 days ago but progressed quickly over last few hours. Endorses IV drug use in thigh but states been 2 months. Recently incarcerated but been out for about a month. Hx of endocarditis. FINDINGS: BONES AND JOINTS: No acute fracture or focal osseous lesion. No dislocation. The joint spaces are normal. SOFT TISSUES: Circumferential subcutaneous stranding/edema, most prominent at the mid/distal calf. No drainable fluid collection/abscess. VASCULATURE: Vessels remain patent. IMPRESSION: 1. Circumferential subcutaneous stranding/edema, suggesting cellulitis. 2. No drainable fluid collection/abscess. Electronically signed by: Pinkie Pebbles MD 12/03/2023 03:46 AM EDT RP Workstation: [id number]   CT FEMUR LEFT W CONTRAST Result Date: 12/03/2023 EXAM: CT OF THE LEFT FEMUR, WITH IV CONTRAST 12/03/2023 03:35:58 AM TECHNIQUE: Axial images were acquired through the left femur with IV contrast. Reformatted images were reviewed. Automated exposure control, iterative reconstruction, and/or weight based adjustment of the mA/kV was utilized to reduce the radiation dose to as low as reasonably achievable. COMPARISON: None available. CLINICAL HISTORY: Soft tissue infection suspected, thigh, xray done. Pt reports leg edema and redness and left arm swelling and redness. Started 2 days ago but progressed quickly over last few hours. Endorses IV drug use in thigh but states been 2 months. Recently incarcerated but been out for  about a month. Hx of endocarditis. FINDINGS: BONES: No acute fracture or focal osseous lesion. JOINTS: No dislocation. The joint spaces are normal. SOFT TISSUES: Mild subcutaneous stranding/edema most prominent along the lateral mid/lower thigh. No drainable fluid collection/abscess. 16 mm short axis left inguinal node (image 57), likely reactive. VASCULATURE: Vessels remain patent. IMPRESSION: 1. Mild subcutaneous stranding/edema, suggesting cellulitis. 2. No drainable fluid collection or abscess. Electronically signed by: Pinkie Pebbles MD 12/03/2023 03:45 AM EDT RP Workstation: [id number]   DG Chest Port 1 View Result Date: 12/03/2023 CLINICAL DATA:  Leg edema and redness with left arm swelling and redness x2 days. EXAM: PORTABLE CHEST 1 VIEW COMPARISON:  January 19, 2021 FINDINGS: The heart  size and mediastinal contours are within normal limits. Mild, stable linear scarring is seen along the periphery of the right lung base. No acute infiltrate, pleural effusion or pneumothorax is identified. Stable postoperative changes are noted throughout the thoracolumbar spine. The visualized skeletal structures are otherwise unremarkable. IMPRESSION: No active cardiopulmonary disease. Electronically Signed   By: Suzen Dials M.D.   On: 12/03/2023 03:09    EKG: Independently reviewed. Sinus tachycardia, no STEMI.   Assessment and Plan  Severe sepsis secondary to severe cellulitis of left lower extremity Patient is presenting with 2-day history of rapidly progressive left lower extremity erythema, swelling, and pain.  Meets criteria for severe sepsis with tachycardia, lactic acidosis, and significant leukocytosis (WBC count 27.6).  No hypotension.  CT scans of entire left lower extremity showing findings consistent with cellulitis but no abscess or soft tissue gas seen.  However, there remains concern for possible necrotizing fasciitis given significant leukocytosis, pain, and rapidly progressive  infection.  Orthopedics has been consulted.  Keep n.p.o.  Continue vancomycin , Zosyn , and clindamycin .  Continue IV fluid hydration and pain management.  Trend lactate and WBC count.  Follow-up blood cultures.  AKI BUN 46, creatinine 2.3 (previously 0.5 on labs done in October 2022).  Patient received IV contrast in the ED for CT imaging of her leg.  Avoid giving any additional contrast and continue IV fluid hydration.  Avoid any other nephrotoxic agents.  Renal ultrasound ordered.  Left upper extremity swelling, erythema, pain Continue antibiotics for cellulitis and CT imaging without contrast ordered for further evaluation.  Hyponatremia Continue IV fluid hydration and repeat labs ordered to check sodium level.  Hypokalemia Monitor potassium and magnesium  levels, continue to replace as needed.  Mild metabolic acidosis In the setting of AKI.  Continue IV fluid hydration and monitor labs.   HFmrEF Last echo done in September 2022 showing EF 40 to 45%.  Does not appear volume overloaded.  Check BNP.  Severe tricuspid regurgitation Severe pulmonary hypertension Noted on previous echo.  Ongoing substance abuse TOC consulted for counseling.  DVT prophylaxis: SCDs Code Status: Full Code (discussed with the patient) Family Communication: No family available at this time. Consults called: Orthopedics Level of care: Progressive Care Unit Admission status: It is my clinical opinion that admission to INPATIENT is reasonable and necessary because of the expectation that this patient will require hospital care that crosses at least 2 midnights to treat this condition based on the medical complexity of the problems presented.  Given the aforementioned information, the predictability of an adverse outcome is felt to be significant.  Editha Ram MD [facility]  If 7PM-7AM, please contact night-coverage [email or web address]  12/03/2023, 5:37 AM

## 2023-12-03 NOTE — ED Triage Notes (Addendum)
 Pt reports leg edema and redness and left arm swelling and redness. Started 2 days ago but progressed quickly over last few hours. Endorses IV drug use in thigh but states been 2 months. Recently incarcerated but been out for about a month. Hx of endocarditis.

## 2023-12-03 NOTE — Anesthesia Procedure Notes (Signed)
 Procedure Name: Intubation Date/Time: 12/03/2023 7:22 AM  Performed by: Marva Lonni PARAS, CRNAPre-anesthesia Checklist: Patient identified, Emergency Drugs available, Suction available and Patient being monitored Patient Re-evaluated:Patient Re-evaluated prior to induction Oxygen Delivery Method: Circle System Utilized Preoxygenation: Pre-oxygenation with 100% oxygen Induction Type: IV induction Ventilation: Mask ventilation without difficulty Laryngoscope Size: Mac and 4 Grade View: Grade I Tube type: Oral Tube size: 7.0 mm Number of attempts: 1 Airway Equipment and Method: Stylet Placement Confirmation: ETT inserted through vocal cords under direct vision, positive ETCO2 and breath sounds checked- equal and bilateral Secured at: 21 cm Tube secured with: Tape Dental Injury: Teeth and Oropharynx as per pre-operative assessment

## 2023-12-03 NOTE — Sepsis Progress Note (Signed)
 Elink following for sepsis protocol.

## 2023-12-03 NOTE — Progress Notes (Signed)
 PROGRESS NOTE    Kelly [name]  FMW:[id number] DOB: 05-22-1985 DOA: 12/03/2023 PCP: Pcp, No    Brief Narrative:  38 y.o. 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 difficile infection, bipolar disorder, hep C, hypertension, anemia presenting with complaints of rapidly progressive left leg erythema, pain, and swelling x 2 days.  Patient was found to be septic, CT scan showed concerns of cellulitis of left lower extremity along with necrotizing fasciitis.  Started on broad-spectrum antibiotics and orthopedic consulted who performed excisional debridement on 7/22.   Assessment & Plan:  Principal Problem:   Cellulitis Active Problems:   Substance abuse (HCC)   AKI (acute kidney injury) (HCC)   Severe sepsis (HCC)   Hypokalemia      Severe sepsis secondary to severe cellulitis of left lower extremity Severe sepsis in the setting of IV drug abuse and left lower extremity cellulitis.  CT scan is concerning for necrotizing fasciitis therefore underwent excisional debridement by orthopedic on 7/22.  Continue broad-spectrum antibiotics.  Clinically monitor.   AKI with metabolic acidosis BUN 46, creatinine 2.3 (previously 0.5 on labs done in October 2022).  Current pia IV fluids - Renal ultrasound   Left upper extremity swelling, erythema, pain Currently on IV antibiotics.  Will go ahead and obtain CT of left upper extremity   Hyponatremia/hypokalemia/hypomagnesemia I be fluids, monitor and replete electrolytes.  Check magnesium  and phosphorus    HFmrEF History of severe TR and pulmonary hypertension Last echo done in September 2022 showing EF 40 to 45%.  No signs of volume overload, appears to be compensated.  While admitted we will go ahead and repeat echocardiogram   Ongoing substance abuse/IV drug  abuse TOC consulted for counseling.   DVT prophylaxis: SCDs Code Status: Full Code (discussed with the patient) Family Communication:   Status is: Inpatient Continue hospital stay for IV antibiotics    Subjective: Patient seen in PACU after surgery.  She is very drowsy but continues to ask for narcotics.  I explained her she does not need one at this time   Examination:  General exam: Appears calm and comfortable, drowsy Respiratory system: Clear to auscultation. Respiratory effort normal. Cardiovascular system: S1 & S2 heard, RRR. No JVD, murmurs, rubs, gallops or clicks. No pedal edema. Gastrointestinal system: Abdomen is nondistended, soft and nontender. No organomegaly or masses felt. Normal bowel sounds heard. Central nervous system: Alert and oriented. No focal neurological deficits. Extremities: Lower extremity dressing in place.  Left upper extremity swelling noted Skin: Left lower extremity dressing in place Psychiatry: Judgement and insight appear normal. Mood & affect appropriate.                Diet Orders (From admission, onward)     Start     Ordered   12/03/23 0237  Diet NPO time specified  (Septic presentation on arrival (screening labs, nursing and treatment orders for obvious sepsis))  Diet effective now        12/03/23 0237            Objective: Vitals:   12/03/23 0915 12/03/23 0930 12/03/23 1000 12/03/23 1022  BP:  110/61 101/68 (!) 109/58  Pulse: (!) 106 (!) 105 (!) 105 (!) 108  Resp: (!) 23 15 17    Temp:   97.7 F (36.5 C) 97.6 F (36.4 C)  TempSrc:      SpO2: 96% 97%  95% 96%  Weight:    80 kg  Height:        Intake/Output Summary (Last 24 hours) at 12/03/2023 1302 Last data filed at 12/03/2023 1000 Gross per 24 hour  Intake 2450.19 ml  Output 420 ml  Net 2030.19 ml   Filed Weights   12/03/23 0626 12/03/23 0633 12/03/23 1022  Weight: 74.8 kg 74.8 kg 80 kg    Scheduled Meds:  Chlorhexidine  Gluconate Cloth  6 each Topical  Daily   mupirocin  ointment  1 Application Nasal BID   potassium chloride   40 mEq Oral Once   Continuous Infusions:  sodium chloride  75 mL/hr at 12/03/23 1117   linezolid  (ZYVOX ) IV     magnesium  sulfate bolus IVPB     piperacillin -tazobactam (ZOSYN )  IV 3.375 g (12/03/23 1119)    Nutritional status     Body mass index is 27.62 kg/m.  Data Reviewed:   CBC: Recent Labs  Lab 12/03/23 0256 12/03/23 1108  WBC 27.6* 18.5*  NEUTROABS 27.0*  --   HGB 10.8* 9.2*  HCT 33.6* 28.7*  MCV 84.2 84.2  PLT 279 230   Basic Metabolic Panel: Recent Labs  Lab 12/03/23 0256 12/03/23 1108  NA 127* 134*  K 3.1* 3.0*  CL 94* 100  CO2 19* 18*  GLUCOSE 111* 79  BUN 46* 39*  CREATININE 2.30* 1.55*  CALCIUM  8.5* 8.1*  MG  --  1.0*   GFR: Estimated Creatinine Clearance: 53.1 mL/min (A) (by C-G formula based on SCr of 1.55 mg/dL (H)). Liver Function Tests: Recent Labs  Lab 12/03/23 0256  AST 29  ALT 10  ALKPHOS 121  BILITOT 1.0  PROT 6.7  ALBUMIN 2.6*   No results for input(s): LIPASE, AMYLASE in the last 168 hours. No results for input(s): AMMONIA in the last 168 hours. Coagulation Profile: Recent Labs  Lab 12/03/23 0256  INR 1.0   Cardiac Enzymes: No results for input(s): CKTOTAL, CKMB, CKMBINDEX, TROPONINI in the last 168 hours. BNP (last 3 results) No results for input(s): PROBNP in the last 8760 hours. HbA1C: No results for input(s): HGBA1C in the last 72 hours. CBG: No results for input(s): GLUCAP in the last 168 hours. Lipid Profile: No results for input(s): CHOL, HDL, LDLCALC, TRIG, CHOLHDL, LDLDIRECT in the last 72 hours. Thyroid  Function Tests: No results for input(s): TSH, T4TOTAL, FREET4, T3FREE, THYROIDAB in the last 72 hours. Anemia Panel: No results for input(s): VITAMINB12, FOLATE, FERRITIN, TIBC, IRON , RETICCTPCT in the last 72 hours. Sepsis Labs: Recent Labs  Lab 12/03/23 0258 12/03/23 0458   LATICACIDVEN 2.1* 1.8    Recent Results (from the past 240 hours)  Blood Culture (routine x 2)     Status: None (Preliminary result)   Collection Time: 12/03/23  2:56 AM   Specimen: BLOOD  Result Value Ref Range Status   Specimen Description BLOOD BLOOD LEFT ARM  Final   Special Requests   Final    BOTTLES DRAWN AEROBIC ONLY Blood Culture results may not be optimal due to an inadequate volume of blood received in culture bottles   Culture   Final    NO GROWTH < 12 HOURS Performed at [name], [address] [place], [place] [address]    Report Status PENDING  Incomplete  Blood Culture (routine x 2)     Status: None (Preliminary result)   Collection Time: 12/03/23  2:58 AM   Specimen: BLOOD  Result Value Ref Range Status   Specimen Description BLOOD BLOOD  RIGHT ARM  Final   Special Requests   Final    BOTTLES DRAWN AEROBIC AND ANAEROBIC Blood Culture adequate volume   Culture   Final    NO GROWTH < 12 HOURS Performed at [facility], [address], [place], [place] [address]    Report Status PENDING  Incomplete  Resp panel by RT-PCR (RSV, Flu A&B, Covid) Anterior Nasal Swab     Status: None   Collection Time: 12/03/23  3:50 AM   Specimen: Anterior Nasal Swab  Result Value Ref Range Status   SARS Coronavirus 2 by RT PCR NEGATIVE NEGATIVE Final   Influenza A by PCR NEGATIVE NEGATIVE Final   Influenza B by PCR NEGATIVE NEGATIVE Final    Comment: (NOTE) The Xpert Xpress SARS-CoV-2/FLU/RSV plus assay is intended as an aid in the diagnosis of influenza from Nasopharyngeal swab specimens and should not be used as a sole basis for treatment. Nasal washings and aspirates are unacceptable for Xpert Xpress SARS-CoV-2/FLU/RSV testing.  Fact Sheet for Patients: BloggerCourse.com  Fact Sheet for Healthcare Providers: SeriousBroker.it  This test is not yet approved or cleared by the [place]  FDA and has been  authorized for detection and/or diagnosis of SARS-CoV-2 by FDA under an Emergency Use Authorization (EUA). This EUA will remain in effect (meaning this test can be used) for the duration of the COVID-19 declaration under Section 564(b)(1) of the Act, 21 U.S.C. section 360bbb-3(b)(1), unless the authorization is terminated or revoked.     Resp Syncytial Virus by PCR NEGATIVE NEGATIVE Final    Comment: (NOTE) Fact Sheet for Patients: BloggerCourse.com  Fact Sheet for Healthcare Providers: SeriousBroker.it  This test is not yet approved or cleared by the [place]  FDA and has been authorized for detection and/or diagnosis of SARS-CoV-2 by FDA under an Emergency Use Authorization (EUA). This EUA will remain in effect (meaning this test can be used) for the duration of the COVID-19 declaration under Section 564(b)(1) of the Act, 21 U.S.C. section 360bbb-3(b)(1), unless the authorization is terminated or revoked.  Performed at [facility], [address], [place], [place] [address]   Surgical pcr screen     Status: Abnormal   Collection Time: 12/03/23  6:44 AM   Specimen: Nasal Mucosa; Nasal Swab  Result Value Ref Range Status   MRSA, PCR POSITIVE (A) NEGATIVE Final    Comment: RESULT CALLED TO, READ BACK BY AND VERIFIED WITH: RN HEATHER BOWMAN [phone] [id number] BY J RAZZAK, MT    Staphylococcus aureus POSITIVE (A) NEGATIVE Final    Comment: RESULT CALLED TO, READ BACK BY AND VERIFIED WITH: RN POWELL SINK [phone] BY J RAZZAK, MT (NOTE) The Xpert SA Assay (FDA approved for NASAL specimens in patients 71 years of age and older), is one component of a comprehensive surveillance program. It is not intended to diagnose infection nor to guide or monitor treatment. Performed at [facility], [address], [place], [place] [address]          Radiology Studies: CT FOOT LEFT W CONTRAST Result Date:  12/03/2023 EXAM: CT LEFT FOOT, WITH IV CONTRAST 12/03/2023 03:35:58 AM TECHNIQUE: Axial images were acquired through the left foot with IV contrast. Reformatted images were reviewed. Automated exposure control, iterative reconstruction, and/or weight based adjustment of the mA/kV was utilized to reduce the radiation dose to as low as reasonably achievable. COMPARISON: None available. CLINICAL HISTORY: Osteomyelitis suspected, foot, xray done; ?nec fasc. 75cc [id number]; Pt reports leg edema and  redness and left arm swelling and redness. Started 2 days ago but progressed quickly over last few hours. Endorses IV drug use in thigh but states been 2 months. Recently incarcerated but been out for about a month. Hx of endocarditis. FINDINGS: BONES AND JOINTS: No acute fracture or focal osseous lesion. No dislocation. No cortical destruction to suggest osteomyelitis. The joint spaces are normal. SOFT TISSUES: Extensive subcutaneous fluid/edema along the dorsal foot. No drainable fluid collection/abscess. No soft tissue gas to suggest necrotizing fasciitis. IMPRESSION: 1. Extensive subcutaneous fluid/edema along the dorsal foot. 2. No drainable fluid collection/abscess. 3. No soft tissue gas to suggest necrotizing fasciitis. Electronically signed by: Pinkie Pebbles MD 12/03/2023 03:49 AM EDT RP Workstation: [id number]   CT TIBIA FIBULA LEFT W CONTRAST Result Date: 12/03/2023 EXAM: CT Left Lower Extremity, With IV Contrast 12/03/2023 03:35:58 AM TECHNIQUE: Axial images were acquired through the left lower extremity with IV contrast. Reformatted images were reviewed. Automated exposure control, iterative reconstruction, and/or weight based adjustment of the mA/kV was utilized to reduce the radiation dose to as low as reasonably achievable. COMPARISON: None available. CLINICAL HISTORY: Soft tissue infection suspected, lower leg, xray done. Pt reports leg edema and redness and left arm swelling and redness. Started 2 days ago  but progressed quickly over last few hours. Endorses IV drug use in thigh but states been 2 months. Recently incarcerated but been out for about a month. Hx of endocarditis. FINDINGS: BONES AND JOINTS: No acute fracture or focal osseous lesion. No dislocation. The joint spaces are normal. SOFT TISSUES: Circumferential subcutaneous stranding/edema, most prominent at the mid/distal calf. No drainable fluid collection/abscess. VASCULATURE: Vessels remain patent. IMPRESSION: 1. Circumferential subcutaneous stranding/edema, suggesting cellulitis. 2. No drainable fluid collection/abscess. Electronically signed by: Pinkie Pebbles MD 12/03/2023 03:46 AM EDT RP Workstation: [id number]   CT FEMUR LEFT W CONTRAST Result Date: 12/03/2023 EXAM: CT OF THE LEFT FEMUR, WITH IV CONTRAST 12/03/2023 03:35:58 AM TECHNIQUE: Axial images were acquired through the left femur with IV contrast. Reformatted images were reviewed. Automated exposure control, iterative reconstruction, and/or weight based adjustment of the mA/kV was utilized to reduce the radiation dose to as low as reasonably achievable. COMPARISON: None available. CLINICAL HISTORY: Soft tissue infection suspected, thigh, xray done. Pt reports leg edema and redness and left arm swelling and redness. Started 2 days ago but progressed quickly over last few hours. Endorses IV drug use in thigh but states been 2 months. Recently incarcerated but been out for about a month. Hx of endocarditis. FINDINGS: BONES: No acute fracture or focal osseous lesion. JOINTS: No dislocation. The joint spaces are normal. SOFT TISSUES: Mild subcutaneous stranding/edema most prominent along the lateral mid/lower thigh. No drainable fluid collection/abscess. 16 mm short axis left inguinal node (image 57), likely reactive. VASCULATURE: Vessels remain patent. IMPRESSION: 1. Mild subcutaneous stranding/edema, suggesting cellulitis. 2. No drainable fluid collection or abscess. Electronically signed  by: Pinkie Pebbles MD 12/03/2023 03:45 AM EDT RP Workstation: [id number]   DG Chest Port 1 View Result Date: 12/03/2023 CLINICAL DATA:  Leg edema and redness with left arm swelling and redness x2 days. EXAM: PORTABLE CHEST 1 VIEW COMPARISON:  January 19, 2021 FINDINGS: The heart size and mediastinal contours are within normal limits. Mild, stable linear scarring is seen along the periphery of the right lung base. No acute infiltrate, pleural effusion or pneumothorax is identified. Stable postoperative changes are noted throughout the thoracolumbar spine. The visualized skeletal structures are otherwise unremarkable. IMPRESSION: No active cardiopulmonary disease. Electronically Signed   By:  Suzen Dials M.D.   On: 12/03/2023 03:09           LOS: 0 days   Time spent= 35 mins    Burgess JAYSON Dare, MD [facility]  If 7PM-7AM, please contact night-coverage  12/03/2023, 1:02 PM

## 2023-12-03 NOTE — Anesthesia Postprocedure Evaluation (Signed)
 Anesthesia Post Note  Patient: Kelly [name]  Procedure(s) Performed: FASCIOTOMY, INCISION AND DRAINAGE NECROTIZING FASCITIS (Left)     Patient location during evaluation: PACU Anesthesia Type: General Level of consciousness: awake and alert Pain management: pain level controlled Vital Signs Assessment: post-procedure vital signs reviewed and stable Respiratory status: spontaneous breathing, nonlabored ventilation and respiratory function stable Cardiovascular status: blood pressure returned to baseline and stable Postop Assessment: no apparent nausea or vomiting Anesthetic complications: no   No notable events documented.  Last Vitals:  Vitals:   12/03/23 0845 12/03/23 0900  BP: 111/67 105/71  Pulse: (!) 115 (!) 106  Resp: 14 19  Temp:    SpO2: 96% 98%    Last Pain:  Vitals:   12/03/23 0900  TempSrc:   PainSc: Asleep                 Butler Levander Pinal

## 2023-12-03 NOTE — ED Notes (Signed)
 ED Provider at bedside.

## 2023-12-03 NOTE — Hospital Course (Addendum)
 38 y.o. 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 difficile infection, bipolar disorder, hep C, hypertension, anemia presenting with complaints of rapidly progressive left leg erythema, pain, and swelling x 2 days PTA. In ED found to be septic, CT scan showed concerns of cellulitis of left lower extremity along with necrotizing fasciitis.she was started on broad-spectrum antibiotics and orthopedic consulted who performed excisional debridement on 12/03/22.  Subjective: Patient seen and examined this morning She is resting comfortably Overnight afebrile BP stable at times in 90s Labs reviewed this morning WBC nicely improving 14.9, mag 1.6 K stable  Assessment & Plan:   Severe sepsis POA  Left lower extremity necrotizing fasciitis  Left upper extremity abscess/necrotizing fasciitis IVDA s/p I&D orthopedic on 7/22> on wound vac,Dr Harden following and plan for repeat debridement on Wednesday Underwent I&D 7/25 on LUE , and wound VAC application as wel byl Dr Carlton further surgery anticipated on LUE ID following closely, blood cultures since admission no growth. Await PICC line due to IVDA/not a candidate for home IV antibiotic therapy. Continue Zosyn , linezolid  IV Continue pain management pharmacy consulted and placed on methadone , she repoets she can follow-up at methadone  clinic- was on it previously  Hep C positive: Follow-up viral load as per ID  AKI with metabolic acidosis AKI has resolved.   Anemia of chronic disease: Some drop suspect due to patient's acute illness.  Monitor and transfuse if less than 7 g  Hyponatremia Hypokalemia Hypomagnesemia: Replace and monitor electrolytes  Recent Labs  Lab 12/03/23 0256 12/03/23 1108 12/04/23 0836 12/05/23 0604 12/07/23 0419  K 3.1* 3.0* 3.3* 3.9  5.1  CALCIUM  8.5* 8.1* 8.2* 8.2* 8.4*  MG  --  1.0* 1.7 1.5* 1.6*  PHOS  --   --  3.4  --   --    HFmrEF History of severe TR and pulmonary hypertension Last echo done in September 2022 showing EF 40 to 45%.  No signs of volume overload, appears to be compensated.  repeat echocardiogram > EF 70-75%, dissipate left ventricle with right ventricular pressure overload mildly reduced RV SF severely elevated pulmonary artery systolic pressure severely dilated RA massive tricuspid valve regurgitation with severely dilated TV annulus with known history of TV endocarditis.  Advised to follow-up with outpatient cardiology   Ongoing substance abuse/IV drug abuse TOC consulted for counseling.  Patient reports she has been snorting and has not recently used IV drugs.  Reports being on methadone  and asking if she can resume.  Pharmacy consulted slowly started methadone > uptitrate slowly.  DVT prophylaxis: SCDs Start: 12/03/23 0617 Code Status:   Code Status: Full Code Family Communication: plan of care discussed with patient at bedside. Patient status is: Remains hospitalized because of severity of illness Level of care: Med-Surg   Dispo: The patient is from: home            Anticipated disposition: TBD Objective: Vitals last 24 hrs: Vitals:   12/06/23 2124 12/06/23 2155 12/07/23 0429 12/07/23 0832  BP: 131/73 90/62 118/81 113/81  Pulse: 86 87 78 69  Resp: 18  18 16   Temp: 98 F (36.7 C) 98.2 F (36.8 C) 97.6 F (36.4 C) 97.6 F (36.4 C)  TempSrc: Oral Oral Oral Oral  SpO2: 90% Kelly% 97% 96%  Weight:      Height:        Physical Examination: General exam: alert awake, pleasant  and appears older than her age HEENT:Oral mucosa moist, Ear/Nose WNL grossly Respiratory system: Bilaterally clear BS, no use of accessory muscle Cardiovascular system: S1 & S2 +. Gastrointestinal system: Abdomen soft, NT,ND,BS+ Nervous System: Alert, awake, following commands. Extremities: LUE and LLE with postop  dressing in place along with wound VACs Skin: No rashes,warm. MSK: Normal muscle bulk/tone.

## 2023-12-03 NOTE — Op Note (Signed)
 Orthopaedic Surgery Operative Note (CSN: [id number] ) Date of Surgery: 12/03/2023  Admit Date: 12/03/2023   Diagnoses: Pre-Op Diagnoses: Left lower extremity necrotizing fascitis   Post-Op Diagnosis: Same  Procedures: CPT 11004-Debridement of necrotizing fascitis left lower extremity CPT 97606-Wound vac placement left lower extremity   Surgeons : Primary: Tysheka Fanguy, Franky SQUIBB, MD  Assistant: None  Location: OR 3   Anesthesia:General   Antibiotics: Schedule IV antibiotics  Tourniquet time: None    Estimated Blood Loss: 20 mL  Complications:* No complications entered in OR log *   Specimens:* No specimens in log *   Implants: * No implants in log *   Indications for Surgery: 38 year old female who developed a progressing in worsening leg infection.  CT scan was obtained which showed likely necrotizing fasciitis around the soft tissues.  Due to her significant systemic issues as well as the appearance of her leg I recommend proceeding with incision and drainage of necrotizing fasciitis with debridement and wound VAC placement.  Risks and benefits were discussed with the patient.  She agreed to proceed with surgery and consent was obtained.  Operative Findings: 1.  Incision and debridement of left lower extremity necrotizing fasciitis with medial and lateral incisions about the calf with a dorsal foot incision.  Dishwater fluid along the fascial planes especially distally around the ankle.  There is a less involvement of the fascial planes as I went proximal into the calf and proximal tibia area. 2.  Irrigation of lower extremity wounds with wound VAC placement using black granular foam sponge.  Procedure: The patient was identified in the preoperative holding area. Consent was confirmed with the patient and their family and all questions were answered. The operative extremity was marked after confirmation with the patient. she was then brought back to the operating room by our  anesthesia colleagues.  She was placed under general anesthetic and carefully transferred over to radiolucent flattop table.  The left lower extremity was prepped and draped in usual sterile fashion.  A timeout was performed to verify the patient, the procedure, and the extremity.  Preoperative antibiotics were dosed in the emergency room.  I made a lateral medial incision along the course of the lower leg.  Carried it down through skin subcutaneous tissue.  The fascia was not violated or has obvious infection of the fascia but there was significant fluid along the planes of the fascia.  This extended most leave medial and lateral but only went up to about the mid calf.  I made a dorsal incision about the foot and the fascial planes were easily dissected for blunt finger dissection and dishwater fluid was visualized consistent with a necrotizing fasciitis.  I extended my incision medially all the way up to posterior to the knee.  Above this the fascial planes did not get disrupted by gentle finger dissection.  I then used low-pressure pulsatile lavage to thoroughly irrigate the wounds.  I used 3 L of normal saline.  I then used black granular foam sponge to place into the wounds and connected this to 125 mmHg.  Sterile dressing was applied.  The patient was then awoke from anesthesia and taken to the PACU in stable condition.   Debridement type: Excisional Debridement  Side: left  Body Location: Lower leg  Tools used for debridement: scalpel  Pre-debridement Wound size (cm):   N/A  Post-debridement Wound size (cm):   Medial 25 cm long, 2 cm wide and 1 cm deep  Lateral 20 cm long 2  cm wide and 1 cm deep  Dorsal foot: 8 cm long 3 cm wide and 1.5 cm deep.  Debridement depth beyond dead/damaged tissue down to healthy viable tissue: yes  Tissue layer involved: skin, subcutaneous tissue, muscle / fascia  Nature of tissue removed: Devitalized Tissue and Purulence  Irrigation volume: 3L      Irrigation fluid type: Normal Saline   Post Op Plan/Instructions: The patient be weightbearing as tolerated to the left lower extremity.  She will receive broad-spectrum antibiotics.  Will monitor her clinical situation closely and plan for likely return to operating room later this week for repeat debridement and possible closure of the skin.  I was present and performed the entire surgery.  Franky Light, MD [facility]

## 2023-12-03 NOTE — Progress Notes (Signed)
 Pharmacy Antibiotic Note  Kelly [name] is a 38 y.o. female admitted on 12/03/2023 with concern for necrotizing fasciitis.  Pharmacy has been consulted for vancomycin  and Zosyn  dosing.  Pt w/ AKI; last known SCr 0.5 in 2022, now 2.3.  Plan: Rec'd vanc 1750mg  in ED; monitor SCr +/- vanc levels prior to redosing. Zosyn  3.375g IV q8h (4 hour infusion).  Height: 5' 7 (170.2 cm) Weight: 74.8 kg (165 lb) IBW/kg (Calculated) : 61.6  Temp (24hrs), Avg:97.9 F (36.6 C), Min:97.5 F (36.4 C), Max:98.3 F (36.8 C)  Recent Labs  Lab 12/03/23 0256 12/03/23 0258 12/03/23 0458  WBC 27.6*  --   --   CREATININE 2.30*  --   --   LATICACIDVEN  --  2.1* 1.8    Estimated Creatinine Clearance: 34.7 mL/min (A) (by C-G formula based on SCr of 2.3 mg/dL (H)).    Allergies  Allergen Reactions   Hydrocodone  Itching   Morphine  And Codeine Nausea And Vomiting    Thank you for allowing pharmacy to be a part of this patient's care.  Marvetta Dauphin, PharmD, BCPS  12/03/2023 6:50 AM

## 2023-12-03 NOTE — Progress Notes (Signed)
 ED Pharmacy Antibiotic Sign Off An antibiotic consult was received from an ED provider for Vancomycin  per pharmacy dosing for cellulitis. A chart review was completed to assess appropriateness.   The following one time order(s) were placed:  Vancomycin  1750mg  IV   Further antibiotic and/or antibiotic pharmacy consults should be ordered by the admitting provider if indicated.   Thank you for allowing pharmacy to be a part of this patient's care.   Vito Ralph, PharmD, BCPS Please see amion for complete clinical pharmacist phone list 12/03/23 2:43 AM

## 2023-12-03 NOTE — Interval H&P Note (Signed)
 History and Physical Interval Note:  12/03/2023 6:28 AM  Kelly [name]  has presented today for surgery, with the diagnosis of Left leg necrotizing fascitis.  The various methods of treatment have been discussed with the patient and family. After consideration of risks, benefits and other options for treatment, the patient has consented to  Procedure(s): FASCIOTOMY, INCISION AND DRAINAGE NECROTIZING FASCITIS (Left) as a surgical intervention.  The patient's history has been reviewed, patient examined, no change in status, stable for surgery.  I have reviewed the patient's chart and labs.  Questions were answered to the patient's satisfaction.     Trishna Cwik P Stefani Baik

## 2023-12-03 NOTE — Anesthesia Preprocedure Evaluation (Addendum)
 Anesthesia Evaluation  Patient identified by MRN, date of birth, ID band Patient awake    Reviewed: Allergy & Precautions, NPO status , Patient's Chart, lab work & pertinent test results  Airway Mallampati: I  TM Distance: >3 FB Neck ROM: Full    Dental  (+) Poor Dentition, Chipped, Dental Advisory Given,    Pulmonary former smoker Quit smoking 2020   Pulmonary exam normal breath sounds clear to auscultation       Cardiovascular hypertension, Pt. on medications +CHF  Normal cardiovascular exam Rhythm:Regular Rate:Normal  Echo 5/12 1. Left ventricular ejection fraction, by estimation, is 45 to 50%. The  left ventricle has mildly decreased function. The left ventricle has no  regional wall motion abnormalities. Left ventricular diastolic parameters  were normal. There is the  interventricular septum is flattened in systole and diastole, consistent  with right ventricular pressure and volume overload.  2. Right ventricular systolic function is moderately reduced. The right  ventricular size is severely enlarged. There is mildly elevated pulmonary  artery systolic pressure.  3. The mitral valve is normal in structure. Trivial mitral valve  regurgitation. No evidence of mitral stenosis.  4. Tricuspid leaflets do not coapt well. There is very severe central TR.  . The tricuspid valve is abnormal. Tricuspid valve regurgitation is  severe.  5. The aortic valve is normal in structure. Aortic valve regurgitation is  not visualized. No aortic stenosis is present.  6. The inferior vena cava is normal in size with greater than 50%  respiratory variability, suggesting right atrial pressure of 3 mmHg.    Neuro/Psych  PSYCHIATRIC DISORDERS Anxiety Depression Bipolar Disorder   negative neurological ROS     GI/Hepatic negative GI ROS,,,(+)     substance abuse (hx polysubstance abuse- cocaine, heroin, fentanyl )  cocaine use and IV  drug use, Hepatitis -, C  Endo/Other  negative endocrine ROS    Renal/GU Renal InsufficiencyRenal diseaseCr 1.17  negative genitourinary   Musculoskeletal  (+) Arthritis , Osteoarthritis,  narcotic dependentRecent epidural abscess and pregnancy- now s/p delivery and tubal   Abdominal  (+) - obese  Peds negative pediatric ROS (+)  Hematology  (+) Blood dyscrasia, anemia hct 33.4   Anesthesia Other Findings   Reproductive/Obstetrics negative OB ROS U preg neg 6/15                              Anesthesia Physical Anesthesia Plan  ASA: 3 and emergent  Anesthesia Plan: General   Post-op Pain Management:    Induction: Intravenous, Rapid sequence and Cricoid pressure planned  PONV Risk Score and Plan: 3 and Treatment may vary due to age or medical condition, Ondansetron , Dexamethasone  and Midazolam   Airway Management Planned: Oral ETT  Additional Equipment: None  Intra-op Plan:   Post-operative Plan: Extubation in OR  Informed Consent: I have reviewed the patients History and Physical, chart, labs and discussed the procedure including the risks, benefits and alternatives for the proposed anesthesia with the patient or authorized representative who has indicated his/her understanding and acceptance.     Dental advisory given  Plan Discussed with: CRNA and Anesthesiologist  Anesthesia Plan Comments:          Anesthesia Quick Evaluation

## 2023-12-03 NOTE — H&P (View-Only) (Signed)
 Orthopaedic Trauma Service (OTS) Consult   Patient ID: Kelly [name] MRN: [id number] DOB/AGE: May 15, 1985 38 y.o.  Reason for Consult:Left leg necrotizing fascitis Referring Physician: Dr. Ozell Poisson, MD [name]  HPI: Kelly [name] is an 38 y.o. female who is being seen in consultation at the request of Dr. Poisson for left lower extremity necrotizing fasciitis.  The patient has a history of IV drug abuse but has recently had left leg infection that has progressively gotten worse over the last 48 hours with significant increase in pain and swelling over the last evening.  She presents emergency room.  CT scan showed no soft tissue gas but stranding in her soft tissues in her leg that may be consistent with necrotizing fasciitis.  She also has significantly elevated creatinine with abnormalities in her sodium and potassium consistent with systemic infection.  I was consulted for possible surgical management.  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 12/05/2017   Abscess of breast    rt breast   Abscess of right breast 07/13/2014   Acute cystitis without hematuria    ADD (attention deficit disorder)    Back pain    Bacteremia due to Enterococcus 06/15/2018   Bipolar 1 disorder (HCC)    Endocarditis 2020   Foreign body granuloma of soft tissue, NEC, right upper arm    History of hepatitis C 10/01/2018   Hypertension    Infection of left wrist (HCC) 10/26/2020   Influenza B 06/17/2018   Leukocytosis    Multifocal pneumonia 06/14/2018   Polysubstance abuse (HCC)    Pyelonephritis affecting pregnancy 05/24/2016   Sepsis (HCC) 12/02/2017   Suicidal overdose (HCC)    Thigh abscess 10/26/2020   Vaginal discharge 10/26/2020    Past Surgical History:  Procedure Laterality Date   BACK SURGERY     scoliosis-in care everywhere 08/1999   BUBBLE STUDY  11/01/2020   Procedure: BUBBLE STUDY;  Surgeon: Kate Lonni CROME, MD;  Location: [name]  ENDOSCOPY;  Service: Cardiovascular;;   CESAREAN SECTION     CHOLECYSTECTOMY  March 2015   FOREIGN BODY REMOVAL Right 05/28/2016   Procedure: REMOVAL FOREIGN BODY RIGHT ARM;  Surgeon: Addie Glendia Hacker, MD;  Location: [facility] OR;  Service: Orthopedics;  Laterality: Right;   I & D EXTREMITY Left 10/26/2020   Procedure: IRRIGATION AND DEBRIDEMENT LEFT WRIST;  Surgeon: Shari Easter, MD;  Location: [facility] OR;  Service: Orthopedics;  Laterality: Left;   INCISION AND DRAINAGE ABSCESS Right 11/01/2020   Procedure: INCISION AND DRAINAGE ABSCESS RIGHT THIGH X 2;  Surgeon: Teresa Lonni HERO, MD;  Location: [facility] OR;  Service: General;  Laterality: Right;   IRRIGATION AND DEBRIDEMENT ABSCESS Right 07/13/2014   Procedure: IRRIGATION AND DEBRIDEMEN TRIGHT BREAST ABSCESS;  Surgeon: Donnice Lima, MD;  Location: [facility] OR;  Service: General;  Laterality: Right;   LUMBAR LAMINECTOMY/DECOMPRESSION MICRODISCECTOMY N/A 12/05/2017   Procedure: LUMBAR LAMINECTOMY FOR EPIDURAL ABSCESS;  Surgeon: Mavis Purchase, MD;  Location: [facility] OR;  Service: Neurosurgery;  Laterality: N/A;   MINOR IRRIGATION AND DEBRIDEMENT OF WOUND Left 10/26/2020   Procedure: MINOR IRRIGATION AND DEBRIDEMENT OF LEFT BREAST AND BILATERAL THIGHS;  Surgeon: Shari Easter, MD;  Location: [facility] OR;  Service: Orthopedics;  Laterality: Left;   MULTIPLE EXTRACTIONS WITH ALVEOLOPLASTY Bilateral 07/03/2018   Procedure: Extraction of tooth #'s 30 and 31 with alveoloplasty;  Surgeon: Cyndee Tanda FALCON, DDS;  Location: [facility] OR;  Service: Oral Surgery;  Laterality: Bilateral;  RADIOLOGY WITH ANESTHESIA N/A 12/05/2017   Procedure: MRI WITH ANESTHESIA OF [facility] AND LUMBAR SPINE WITH AND WITHOUT;  Surgeon: Radiologist, Medication, MD;  Location: [facility] OR;  Service: Radiology;  Laterality: N/A;   RIGHT/LEFT HEART CATH AND CORONARY ANGIOGRAPHY N/A 05/21/2019   Procedure: RIGHT/LEFT HEART CATH AND CORONARY ANGIOGRAPHY;  Surgeon: Cherrie Toribio SAUNDERS, MD;  Location: [facility] INVASIVE CV LAB;   Service: Cardiovascular;  Laterality: N/A;   TEE WITHOUT CARDIOVERSION N/A 05/21/2019   Procedure: TRANSESOPHAGEAL ECHOCARDIOGRAM (TEE);  Surgeon: Cherrie Toribio SAUNDERS, MD;  Location: [facility] ENDOSCOPY;  Service: Cardiovascular;  Laterality: N/A;   TEE WITHOUT CARDIOVERSION N/A 11/01/2020   Procedure: TRANSESOPHAGEAL ECHOCARDIOGRAM (TEE);  Surgeon: Kate Lonni CROME, MD;  Location: [name] ENDOSCOPY;  Service: Cardiovascular;  Laterality: N/A;   TUBAL LIGATION Bilateral 09/30/2020   Procedure: POST PARTUM TUBAL LIGATION;  Surgeon: Herchel Gloris LABOR, MD;  Location: [facility] LD ORS;  Service: Gynecology;  Laterality: Bilateral;    Family History  Problem Relation Age of Onset   Diabetes Mother    Diabetes Father    Hypertension Father    Heart attack Neg Hx    Hyperlipidemia Neg Hx    Sudden death Neg Hx     Social History:  reports that she quit smoking about 5 years ago. Her smoking use included cigarettes. She started smoking about 18 years ago. She has a 13 pack-year smoking history. She has never used smokeless tobacco. She reports that she does not currently use alcohol. She reports current drug use. Drugs: Cocaine, Heroin, IV, and Fentanyl .  Allergies:  Allergies  Allergen Reactions   Hydrocodone  Itching   Morphine  And Codeine Nausea And Vomiting    Medications:  No current facility-administered medications on file prior to encounter.   No current outpatient medications on file prior to encounter.     ROS: Constitutional:  +Fevers Vision: No changes in vision ENT: No difficulty swallowing CV: No chest pain Pulm: No SOB or wheezing GI: No nausea or vomiting GU: No urgency or inability to hold urine Skin: No poor wound healing Neurologic: No numbness or tingling Psychiatric: No depression or anxiety Heme: No bruising Allergic: No reaction to medications or food   Exam: Blood pressure 101/63, pulse 98, temperature (!) 97.5 F (36.4 C), resp. rate 10, last menstrual period  11/28/2023, SpO2 100%. General: Drowsy but no significant distress. Orientation: Awake alert oriented x 3 Mood and Affect: Cooperative and appropriate Gait: Unable to assess due to her infection Coordination and balance: Within normal limits  Left lower extremity: Significant erythema and swelling with tenderness to palpation with no crepitus on exam but the skin changes persist all the way up to the mid thigh.  She has difficulty moving her foot and ankle secondary to the pain and discomfort.  She has a warm and well-perfused foot.  Right lower extremity: Skin without lesions. No tenderness to palpation. Full painless ROM, full strength in each muscle groups without evidence of instability.   Medical Decision Making: Data: Imaging: CT scan of the lower extremity has been reviewed which shows stranding of the soft tissues in the areas around the muscle fascia  Labs:  Results for orders placed or performed during the hospital encounter of 12/03/23 (from the past 24 hours)  Comprehensive metabolic panel     Status: Abnormal   Collection Time: 12/03/23  2:56 AM  Result Value Ref Range   Sodium 127 (L) 135 - 145 mmol/L   Potassium 3.1 (L) 3.5 - 5.1 mmol/L  Chloride 94 (L) 98 - 111 mmol/L   CO2 19 (L) 22 - 32 mmol/L   Glucose, Bld 111 (H) 70 - 99 mg/dL   BUN 46 (H) 6 - 20 mg/dL   Creatinine, Ser 7.69 (H) 0.44 - 1.00 mg/dL   Calcium  8.5 (L) 8.9 - 10.3 mg/dL   Total Protein 6.7 6.5 - 8.1 g/dL   Albumin 2.6 (L) 3.5 - 5.0 g/dL   AST 29 15 - 41 U/L   ALT 10 0 - 44 U/L   Alkaline Phosphatase 121 38 - 126 U/L   Total Bilirubin 1.0 0.0 - 1.2 mg/dL   GFR, Estimated 27 (L) >60 mL/min   Anion gap 14 5 - 15  CBC with Differential     Status: Abnormal   Collection Time: 12/03/23  2:56 AM  Result Value Ref Range   WBC 27.6 (H) 4.0 - 10.5 K/uL   RBC 3.99 3.87 - 5.11 MIL/uL   Hemoglobin 10.8 (L) 12.0 - 15.0 g/dL   HCT 66.3 (L) 63.9 - 53.9 %   MCV 84.2 80.0 - 100.0 fL   MCH 27.1 26.0 - 34.0  pg   MCHC 32.1 30.0 - 36.0 g/dL   RDW 85.5 88.4 - 84.4 %   Platelets 279 150 - 400 K/uL   nRBC 0.0 0.0 - 0.2 %   Neutrophils Relative % 98 %   Neutro Abs 27.0 (H) 1.7 - 7.7 K/uL   Lymphocytes Relative 2 %   Lymphs Abs 0.6 (L) 0.7 - 4.0 K/uL   Monocytes Relative 0 %   Monocytes Absolute 0.0 (L) 0.1 - 1.0 K/uL   Eosinophils Relative 0 %   Eosinophils Absolute 0.0 0.0 - 0.5 K/uL   Basophils Relative 0 %   Basophils Absolute 0.0 0.0 - 0.1 K/uL   WBC Morphology See Note    RBC Morphology BURR CELLS    Smear Review See Note    nRBC 0 0 /100 WBC   Abs Immature Granulocytes 0.00 0.00 - 0.07 K/uL   Polychromasia PRESENT   Protime-INR     Status: None   Collection Time: 12/03/23  2:56 AM  Result Value Ref Range   Prothrombin Time 13.6 11.4 - 15.2 seconds   INR 1.0 0.8 - 1.2  hCG, serum, qualitative     Status: None   Collection Time: 12/03/23  2:56 AM  Result Value Ref Range   Preg, Serum NEGATIVE NEGATIVE  I-Stat Lactic Acid, ED     Status: Abnormal   Collection Time: 12/03/23  2:58 AM  Result Value Ref Range   Lactic Acid, Venous 2.1 (HH) 0.5 - 1.9 mmol/L   Comment NOTIFIED PHYSICIAN   Resp panel by RT-PCR (RSV, Flu A&B, Covid) Anterior Nasal Swab     Status: None   Collection Time: 12/03/23  3:50 AM   Specimen: Anterior Nasal Swab  Result Value Ref Range   SARS Coronavirus 2 by RT PCR NEGATIVE NEGATIVE   Influenza A by PCR NEGATIVE NEGATIVE   Influenza B by PCR NEGATIVE NEGATIVE   Resp Syncytial Virus by PCR NEGATIVE NEGATIVE  I-Stat Lactic Acid, ED     Status: None   Collection Time: 12/03/23  4:58 AM  Result Value Ref Range   Lactic Acid, Venous 1.8 0.5 - 1.9 mmol/L     Imaging or Labs ordered: None  Medical history and chart was reviewed and case discussed with medical provider.  Assessment/Plan: 38 year old female with likely necrotizing fasciitis of her left lower extremity.  Patient has clinical findings and laboratory findings that are consistent with a  necrotizing fasciitis.  She has a LRINC score that is significantly elevated having a positive predictive value for the above diagnosis.  She has significant elevated white blood cell count with anemia, acute kidney injury, hyponatremia with elevated inflammatory markers.  We will plan to proceed with irrigation debridement emergently with wound VAC placement.  She will continue on antibiotic regimen and likely need a repeat debridement later this week.  Risks and benefits were discussed with the patient.  I discussed with her that she has high risk of losing her leg and potentially even her life if we do not proceed this morning.  She is in agreement.  Franky MYRTIS Light, MD [facility] [phone] (office) [email or web address]

## 2023-12-03 NOTE — Transfer of Care (Signed)
 Immediate Anesthesia Transfer of Care Note  Patient: Kelly [name]  Procedure(s) Performed: FASCIOTOMY, INCISION AND DRAINAGE NECROTIZING FASCITIS (Left)  Patient Location: PACU  Anesthesia Type:General  Level of Consciousness: drowsy and patient cooperative  Airway & Oxygen Therapy: Patient Spontanous Breathing  Post-op Assessment: Report given to RN and Post -op Vital signs reviewed and stable  Post vital signs: Reviewed and stable  Last Vitals:  Vitals Value Taken Time  BP 114/56 12/03/23 08:13  Temp 97.7 F   Pulse 123   Resp 15 12/03/23 08:15  SpO2 98%   Vitals shown include unfiled device data.  Last Pain:  Vitals:   12/03/23 0633  TempSrc:   PainSc: 10-Worst pain ever      Patients Stated Pain Goal: 1 (12/03/23 9366)  Complications: No notable events documented.

## 2023-12-03 NOTE — ED Provider Notes (Signed)
 [facility] Emergency Department Provider Note MRN:  [id number]  Arrival date & time: 12/03/23     Chief Complaint   Leg Swelling   History of Present Illness   Kelly [name] is a 38 y.o. year-old female with a history of IV use, endocarditis, epidural abscess presenting to the ED with chief complaint of leg pain.  Rapidly progressing leg pain and swelling over the past couple days.  Last used IV drugs 2 months ago before she was incarcerated.  Endorsing fever at home.  Also starting to have some redness to the left upper extremity.  Review of Systems  A thorough review of systems was obtained and all systems are negative except as noted in the HPI and PMH.   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 12/05/2017   Abscess of breast    rt breast   Abscess of right breast 07/13/2014   Acute cystitis without hematuria    ADD (attention deficit disorder)    Back pain    Bacteremia due to Enterococcus 06/15/2018   Bipolar 1 disorder (HCC)    Endocarditis 2020   Foreign body granuloma of soft tissue, NEC, right upper arm    History of hepatitis C 10/01/2018   Hypertension    Infection of left wrist (HCC) 10/26/2020   Influenza B 06/17/2018   Leukocytosis    Multifocal pneumonia 06/14/2018   Polysubstance abuse (HCC)    Pyelonephritis affecting pregnancy 05/24/2016   Sepsis (HCC) 12/02/2017   Suicidal overdose (HCC)    Thigh abscess 10/26/2020   Vaginal discharge 10/26/2020    Past Surgical History:  Procedure Laterality Date   BACK SURGERY     scoliosis-in care everywhere 08/1999   BUBBLE STUDY  11/01/2020   Procedure: BUBBLE STUDY;  Surgeon: Kate Lonni CROME, MD;  Location: [facility] ENDOSCOPY;  Service: Cardiovascular;;   CESAREAN SECTION     CHOLECYSTECTOMY  March 2015   FOREIGN BODY REMOVAL Right 05/28/2016   Procedure: REMOVAL FOREIGN BODY RIGHT ARM;  Surgeon: Addie Glendia Hacker, MD;  [facility];  Service: Orthopedics;  Laterality: Right;   I & D EXTREMITY Left 10/26/2020   Procedure: IRRIGATION AND DEBRIDEMENT LEFT WRIST;  Surgeon: Shari Easter, MD;  Location: [facility] OR;  Service: Orthopedics;  Laterality: Left;   INCISION AND DRAINAGE ABSCESS Right 11/01/2020   Procedure: INCISION AND DRAINAGE ABSCESS RIGHT THIGH X 2;  Surgeon: Teresa Lonni HERO, MD;  [facility] OR;  Service: General;  Laterality: Right;   IRRIGATION AND DEBRIDEMENT ABSCESS Right 07/13/2014   Procedure: IRRIGATION AND DEBRIDEMEN TRIGHT BREAST ABSCESS;  Surgeon: Donnice Lima, MD;  [facility];  Service: General;  Laterality: Right;   LUMBAR LAMINECTOMY/DECOMPRESSION MICRODISCECTOMY N/A 12/05/2017   Procedure: LUMBAR LAMINECTOMY FOR EPIDURAL ABSCESS;  Surgeon: Mavis Purchase, MD;  Location: [name] OR;  Service: Neurosurgery;  Laterality: N/A;   MINOR IRRIGATION AND DEBRIDEMENT OF WOUND Left 10/26/2020   Procedure: MINOR IRRIGATION AND DEBRIDEMENT OF LEFT BREAST AND BILATERAL THIGHS;  Surgeon: Shari Easter, MD;  [facility];  Service: Orthopedics;  Laterality: Left;   MULTIPLE EXTRACTIONS WITH ALVEOLOPLASTY Bilateral 07/03/2018   Procedure: Extraction of tooth #'s 30 and 31 with alveoloplasty;  Surgeon: Cyndee Tanda FALCON, DDS;  [facility] OR;  Service: Oral Surgery;  Laterality: Bilateral;   RADIOLOGY WITH ANESTHESIA N/A 12/05/2017   Procedure: MRI WITH ANESTHESIA OF THORCIC AND LUMBAR SPINE WITH AND  WITHOUT;  Surgeon: Radiologist, Medication, MD;  [facility];  Service: Radiology;  Laterality: N/A;   RIGHT/LEFT HEART CATH AND CORONARY ANGIOGRAPHY N/A 05/21/2019   Procedure: RIGHT/LEFT HEART CATH AND CORONARY ANGIOGRAPHY;  Surgeon: Cherrie Toribio SAUNDERS, MD;  [facility] INVASIVE CV LAB;  Service: Cardiovascular;  Laterality: N/A;   TEE WITHOUT CARDIOVERSION N/A 05/21/2019   Procedure: TRANSESOPHAGEAL ECHOCARDIOGRAM (TEE);  Surgeon: Cherrie Toribio SAUNDERS, MD;  Location: [facility] ENDOSCOPY;   Service: Cardiovascular;  Laterality: N/A;   TEE WITHOUT CARDIOVERSION N/A 11/01/2020   Procedure: TRANSESOPHAGEAL ECHOCARDIOGRAM (TEE);  Surgeon: Kate Lonni CROME, MD;  Location: [name] ENDOSCOPY;  Service: Cardiovascular;  Laterality: N/A;   TUBAL LIGATION Bilateral 09/30/2020   Procedure: POST PARTUM TUBAL LIGATION;  Surgeon: Herchel Gloris LABOR, MD;  [facility] LD ORS;  Service: Gynecology;  Laterality: Bilateral;    Family History  Problem Relation Age of Onset   Diabetes Mother    Diabetes Father    Hypertension Father    Heart attack Neg Hx    Hyperlipidemia Neg Hx    Sudden death Neg Hx     Social History   Socioeconomic History   Marital status: Single    Spouse name: Not on file   Number of children: Not on file   Years of education: Not on file   Highest education level: Not on file  Occupational History   Occupation: unemployed  Tobacco Use   Smoking status: Former    Current packs/day: 0.00    Average packs/day: 1 pack/day for 13.0 years (13.0 ttl pk-yrs)    Types: Cigarettes    Start date: 06/13/2005    Quit date: 06/13/2018    Years since quitting: 5.4   Smokeless tobacco: Never  Vaping Use   Vaping status: Never Used  Substance and Sexual Activity   Alcohol use: Not Currently    Comment: one or two drinks weekly   Drug use: Yes    Types: Cocaine, Heroin, IV, Fentanyl     Comment: heroin, crack cocaine, fentanyl  last used today (07/13/20)    Sexual activity: Yes    Birth control/protection: Condom  Other Topics Concern   Not on file  Social History Narrative   Not on file   Social Drivers of Health   Financial Resource Strain: High Risk (08/03/2020)   Overall Financial Resource Strain (CARDIA)    Difficulty of Paying Living Expenses: Very hard  Food Insecurity: No Food Insecurity (09/08/2020)   Hunger Vital Sign    Worried About Running Out of Food in the Last Year: Never true    Ran Out of Food in the Last Year: Never true  Recent Concern: Food  Insecurity - Food Insecurity Present (08/03/2020)   Hunger Vital Sign    Worried About Running Out of Food in the Last Year: Sometimes true    Ran Out of Food in the Last Year: Sometimes true  Transportation Needs: Unmet Transportation Needs (09/08/2020)   PRAPARE - Administrator, Civil Service (Medical): Yes    Lack of Transportation (Non-Medical): Yes  Physical Activity: Not on file  Stress: Not on file  Social Connections: Not on file  Intimate Partner Violence: Not on file     Physical Exam   Vitals:   12/03/23 0545 12/03/23 0615  BP:  109/60  Pulse: 98 (!) 108  Resp: 10 17  Temp:    SpO2: 100% 100%    CONSTITUTIONAL: Chronically ill-appearing, NAD NEURO/PSYCH:  Alert and oriented x 3, no focal  deficits EYES:  eyes equal and reactive ENT/NECK:  no LAD, no JVD CARDIO: Regular rate, well-perfused, normal S1 and S2 PULM:  CTAB no wheezing or rhonchi GI/GU:  non-distended, non-tender MSK/SPINE: Markedly swollen, erythematous left lower extremity with bulla SKIN:  no rash, atraumatic   *Additional and/or pertinent findings included in MDM below  Diagnostic and Interventional Summary    EKG Interpretation Date/Time:    Ventricular Rate:    PR Interval:    QRS Duration:    QT Interval:    QTC Calculation:   R Axis:      Text Interpretation:         Labs Reviewed  COMPREHENSIVE METABOLIC PANEL WITH GFR - Abnormal; Notable for the following components:      Result Value   Sodium 127 (*)    Potassium 3.1 (*)    Chloride 94 (*)    CO2 19 (*)    Glucose, Bld 111 (*)    BUN 46 (*)    Creatinine, Ser 2.30 (*)    Calcium  8.5 (*)    Albumin 2.6 (*)    GFR, Estimated 27 (*)    All other components within normal limits  CBC WITH DIFFERENTIAL/PLATELET - Abnormal; Notable for the following components:   WBC 27.6 (*)    Hemoglobin 10.8 (*)    HCT 33.6 (*)    Neutro Abs 27.0 (*)    Lymphs Abs 0.6 (*)    Monocytes Absolute 0.0 (*)    All other  components within normal limits  I-STAT CG4 LACTIC ACID, ED - Abnormal; Notable for the following components:   Lactic Acid, Venous 2.1 (*)    All other components within normal limits  RESP PANEL BY RT-PCR (RSV, FLU A&B, COVID)  [id number]  CULTURE, BLOOD (ROUTINE X 2)  CULTURE, BLOOD (ROUTINE X 2)  PROTIME-INR  HCG, SERUM, QUALITATIVE  HIV ANTIBODY (ROUTINE TESTING W REFLEX)  CBC  BASIC METABOLIC PANEL WITH GFR  MAGNESIUM   I-STAT CG4 LACTIC ACID, ED    CT FEMUR LEFT W CONTRAST  Final Result    CT TIBIA FIBULA LEFT W CONTRAST  Final Result    CT FOOT LEFT W CONTRAST  Final Result    DG Chest Port 1 View  Final Result    CT HUMERUS LEFT WO CONTRAST    (Results Pending)  CT FOREARM LEFT WO CONTRAST    (Results Pending)  CT HAND LEFT WO CONTRAST    (Results Pending)  US  RENAL    (Results Pending)    Medications  HYDROmorphone  (DILAUDID ) injection 1 mg (has no administration in time range)  naloxone  (NARCAN ) injection 0.4 mg (has no administration in time range)  clindamycin  (CLEOCIN ) IVPB 900 mg (has no administration in time range)  acetaminophen  (TYLENOL ) tablet 650 mg (has no administration in time range)    Or  acetaminophen  (TYLENOL ) suppository 650 mg (has no administration in time range)  oxyCODONE  (Oxy IR/ROXICODONE ) immediate release tablet 5 mg (has no administration in time range)  0.9 %  sodium chloride  infusion (has no administration in time range)  potassium chloride  10 mEq in 100 mL IVPB (has no administration in time range)  piperacillin -tazobactam (ZOSYN ) IVPB 3.375 g (0 g Intravenous Stopped 12/03/23 0338)  clindamycin  (CLEOCIN ) IVPB 900 mg (0 mg Intravenous Stopped 12/03/23 0338)  HYDROmorphone  (DILAUDID ) injection 1 mg (1 mg Intravenous Given 12/03/23 0306)  lactated ringers  bolus 1,000 mL (0 mLs Intravenous Stopped 12/03/23 0440)  vancomycin  (VANCOREADY) IVPB 1750 mg/350 mL (0 mg  Intravenous Stopped 12/03/23 0548)  iohexol  (OMNIPAQUE ) 350 MG/ML injection 75 mL  (75 mLs Intravenous Contrast Given 12/03/23 0336)  ondansetron  (ZOFRAN ) injection 4 mg (4 mg Intravenous Given 12/03/23 0345)  lactated ringers  bolus 1,000 mL (1,000 mLs Intravenous New Bag/Given 12/03/23 0439)     Procedures  /  Critical Care .Critical Care  Performed by: Theadore Ozell HERO, MD Authorized by: Theadore Ozell HERO, MD   Critical care provider statement:    Critical care time (minutes):  80   Critical care was necessary to treat or prevent imminent or life-threatening deterioration of the following conditions:  Sepsis   Critical care was time spent personally by me on the following activities:  Development of treatment plan with patient or surrogate, discussions with consultants, evaluation of patient's response to treatment, examination of patient, ordering and review of laboratory studies, ordering and review of radiographic studies, ordering and performing treatments and interventions, pulse oximetry, re-evaluation of patient's condition and review of old charts   ED Course and Medical Decision Making  Initial Impression and Ddx Exam is immediately concerning for necrotizing fasciitis especially given patient's history.  Vital signs reassuring at this time.  Code sepsis initiated, starting broad-spectrum antibiotics.  Stat paged orthopedic surgery given the neck Fash concern, Dr. Kendal is on board and will see the patient soon, recommending antibiotics and CT imaging.  Of note patient says she has not done IV drugs in 2 months.  She does have a systolic murmur and she has having left upper extremity redness as well.  Underlying concern for recurrent endocarditis, has had in the past.  3 AM update: Given the highly concerning nature and life-threatening condition being considered, plan is to obtain rapid CT imaging of the leg, foregoing labs/hCG.  Past medical/surgical history that increases complexity of ED encounter: IV drug use  Interpretation of Diagnostics I personally  reviewed the Laboratory Testing and my interpretation is as follows: Markedly elevated WBC, acute kidney injury, hyponatremia    Patient Reassessment and Ultimate Disposition/Management     Patient's CT imaging does not reveal subcutaneous air but pretty diffuse circumferential inflammation consistent with at least a cellulitis.  There is still lingering concern for nongas-forming necrotizing infection.  This concern was discussed with Dr. Kendal of orthopedics who is on the way to see the patient.  Excepted for admission to the hospitalist service for IV antibiotics.  Patient management required discussion with the following services or consulting groups:  Hospitalist Service and Orthopedic Surgery  Complexity of Problems Addressed Acute illness or injury that poses threat of life of bodily function  Additional Data Reviewed and Analyzed Further history obtained from: Prior labs/imaging results  Additional Factors Impacting ED Encounter Risk Consideration of hospitalization  Ozell HERO. Theadore, MD [facility] Emergency Medicine [facility] [email or web address]  Final Clinical Impressions(s) / ED Diagnoses     ICD-10-CM   1. Cellulitis, unspecified cellulitis site  L03.90       ED Discharge Orders     None        Discharge Instructions Discussed with and Provided to Patient:   Discharge Instructions   None      Theadore Ozell HERO, MD 12/03/23 [phone]

## 2023-12-03 NOTE — Consult Note (Signed)
 Orthopaedic Trauma Service (OTS) Consult   Patient ID: Kelly [name] MRN: [id number] DOB/AGE: May 15, 1985 38 y.o.  Reason for Consult:Left leg necrotizing fascitis Referring Physician: Dr. Ozell Poisson, MD [name]  HPI: Kelly [name] is an 38 y.o. female who is being seen in consultation at the request of Dr. Poisson for left lower extremity necrotizing fasciitis.  The patient has a history of IV drug abuse but has recently had left leg infection that has progressively gotten worse over the last 48 hours with significant increase in pain and swelling over the last evening.  She presents emergency room.  CT scan showed no soft tissue gas but stranding in her soft tissues in her leg that may be consistent with necrotizing fasciitis.  She also has significantly elevated creatinine with abnormalities in her sodium and potassium consistent with systemic infection.  I was consulted for possible surgical management.  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 12/05/2017   Abscess of breast    rt breast   Abscess of right breast 07/13/2014   Acute cystitis without hematuria    ADD (attention deficit disorder)    Back pain    Bacteremia due to Enterococcus 06/15/2018   Bipolar 1 disorder (HCC)    Endocarditis 2020   Foreign body granuloma of soft tissue, NEC, right upper arm    History of hepatitis C 10/01/2018   Hypertension    Infection of left wrist (HCC) 10/26/2020   Influenza B 06/17/2018   Leukocytosis    Multifocal pneumonia 06/14/2018   Polysubstance abuse (HCC)    Pyelonephritis affecting pregnancy 05/24/2016   Sepsis (HCC) 12/02/2017   Suicidal overdose (HCC)    Thigh abscess 10/26/2020   Vaginal discharge 10/26/2020    Past Surgical History:  Procedure Laterality Date   BACK SURGERY     scoliosis-in care everywhere 08/1999   BUBBLE STUDY  11/01/2020   Procedure: BUBBLE STUDY;  Surgeon: Kate Lonni CROME, MD;  Location: [name]  ENDOSCOPY;  Service: Cardiovascular;;   CESAREAN SECTION     CHOLECYSTECTOMY  March 2015   FOREIGN BODY REMOVAL Right 05/28/2016   Procedure: REMOVAL FOREIGN BODY RIGHT ARM;  Surgeon: Addie Glendia Hacker, MD;  [facility] OR;  Service: Orthopedics;  Laterality: Right;   I & D EXTREMITY Left 10/26/2020   Procedure: IRRIGATION AND DEBRIDEMENT LEFT WRIST;  Surgeon: Shari Easter, MD;  Location: [facility] OR;  Service: Orthopedics;  Laterality: Left;   INCISION AND DRAINAGE ABSCESS Right 11/01/2020   Procedure: INCISION AND DRAINAGE ABSCESS RIGHT THIGH X 2;  Surgeon: Teresa Lonni HERO, MD;  [facility] OR;  Service: General;  Laterality: Right;   IRRIGATION AND DEBRIDEMENT ABSCESS Right 07/13/2014   Procedure: IRRIGATION AND DEBRIDEMEN TRIGHT BREAST ABSCESS;  Surgeon: Donnice Lima, MD;  [facility] OR;  Service: General;  Laterality: Right;   LUMBAR LAMINECTOMY/DECOMPRESSION MICRODISCECTOMY N/A 12/05/2017   Procedure: LUMBAR LAMINECTOMY FOR EPIDURAL ABSCESS;  Surgeon: Mavis Purchase, MD;  Location: [facility] OR;  Service: Neurosurgery;  Laterality: N/A;   MINOR IRRIGATION AND DEBRIDEMENT OF WOUND Left 10/26/2020   Procedure: MINOR IRRIGATION AND DEBRIDEMENT OF LEFT BREAST AND BILATERAL THIGHS;  Surgeon: Shari Easter, MD;  [facility] OR;  Service: Orthopedics;  Laterality: Left;   MULTIPLE EXTRACTIONS WITH ALVEOLOPLASTY Bilateral 07/03/2018   Procedure: Extraction of tooth #'s 30 and 31 with alveoloplasty;  Surgeon: Cyndee Tanda FALCON, DDS;  [facility] OR;  Service: Oral Surgery;  Laterality: Bilateral;  RADIOLOGY WITH ANESTHESIA N/A 12/05/2017   Procedure: MRI WITH ANESTHESIA OF [facility] AND LUMBAR SPINE WITH AND WITHOUT;  Surgeon: Radiologist, Medication, MD;  [facility]: [facility] OR;  Service: Radiology;  Laterality: N/A;   RIGHT/LEFT HEART CATH AND CORONARY ANGIOGRAPHY N/A 05/21/2019   Procedure: RIGHT/LEFT HEART CATH AND CORONARY ANGIOGRAPHY;  Surgeon: Cherrie Toribio SAUNDERS, MD;  [facility] INVASIVE CV LAB;   Service: Cardiovascular;  Laterality: N/A;   TEE WITHOUT CARDIOVERSION N/A 05/21/2019   Procedure: TRANSESOPHAGEAL ECHOCARDIOGRAM (TEE);  Surgeon: Cherrie Toribio SAUNDERS, MD;  Location: [facility] ENDOSCOPY;  Service: Cardiovascular;  Laterality: N/A;   TEE WITHOUT CARDIOVERSION N/A 11/01/2020   Procedure: TRANSESOPHAGEAL ECHOCARDIOGRAM (TEE);  Surgeon: Kate Lonni CROME, MD;  Location: [name] ENDOSCOPY;  Service: Cardiovascular;  Laterality: N/A;   TUBAL LIGATION Bilateral 09/30/2020   Procedure: POST PARTUM TUBAL LIGATION;  Surgeon: Herchel Gloris LABOR, MD;  [facility] LD ORS;  Service: Gynecology;  Laterality: Bilateral;    Family History  Problem Relation Age of Onset   Diabetes Mother    Diabetes Father    Hypertension Father    Heart attack Neg Hx    Hyperlipidemia Neg Hx    Sudden death Neg Hx     Social History:  reports that she quit smoking about 5 years ago. Her smoking use included cigarettes. She started smoking about 18 years ago. She has a 13 pack-year smoking history. She has never used smokeless tobacco. She reports that she does not currently use alcohol. She reports current drug use. Drugs: Cocaine, Heroin, IV, and Fentanyl .  Allergies:  Allergies  Allergen Reactions   Hydrocodone  Itching   Morphine  And Codeine Nausea And Vomiting    Medications:  No current facility-administered medications on file prior to encounter.   No current outpatient medications on file prior to encounter.     ROS: Constitutional:  +Fevers Vision: No changes in vision ENT: No difficulty swallowing CV: No chest pain Pulm: No SOB or wheezing GI: No nausea or vomiting GU: No urgency or inability to hold urine Skin: No poor wound healing Neurologic: No numbness or tingling Psychiatric: No depression or anxiety Heme: No bruising Allergic: No reaction to medications or food   Exam: Blood pressure 101/63, pulse 98, temperature (!) 97.5 F (36.4 C), resp. rate 10, last menstrual period  11/28/2023, SpO2 100%. General: Drowsy but no significant distress. Orientation: Awake alert oriented x 3 Mood and Affect: Cooperative and appropriate Gait: Unable to assess due to her infection Coordination and balance: Within normal limits  Left lower extremity: Significant erythema and swelling with tenderness to palpation with no crepitus on exam but the skin changes persist all the way up to the mid thigh.  She has difficulty moving her foot and ankle secondary to the pain and discomfort.  She has a warm and well-perfused foot.  Right lower extremity: Skin without lesions. No tenderness to palpation. Full painless ROM, full strength in each muscle groups without evidence of instability.   Medical Decision Making: Data: Imaging: CT scan of the lower extremity has been reviewed which shows stranding of the soft tissues in the areas around the muscle fascia  Labs:  Results for orders placed or performed during the hospital encounter of 12/03/23 (from the past 24 hours)  Comprehensive metabolic panel     Status: Abnormal   Collection Time: 12/03/23  2:56 AM  Result Value Ref Range   Sodium 127 (L) 135 - 145 mmol/L   Potassium 3.1 (L) 3.5 - 5.1 mmol/L  Chloride 94 (L) 98 - 111 mmol/L   CO2 19 (L) 22 - 32 mmol/L   Glucose, Bld 111 (H) 70 - 99 mg/dL   BUN 46 (H) 6 - 20 mg/dL   Creatinine, Ser 7.69 (H) 0.44 - 1.00 mg/dL   Calcium  8.5 (L) 8.9 - 10.3 mg/dL   Total Protein 6.7 6.5 - 8.1 g/dL   Albumin 2.6 (L) 3.5 - 5.0 g/dL   AST 29 15 - 41 U/L   ALT 10 0 - 44 U/L   Alkaline Phosphatase 121 38 - 126 U/L   Total Bilirubin 1.0 0.0 - 1.2 mg/dL   GFR, Estimated 27 (L) >60 mL/min   Anion gap 14 5 - 15  CBC with Differential     Status: Abnormal   Collection Time: 12/03/23  2:56 AM  Result Value Ref Range   WBC 27.6 (H) 4.0 - 10.5 K/uL   RBC 3.99 3.87 - 5.11 MIL/uL   Hemoglobin 10.8 (L) 12.0 - 15.0 g/dL   HCT 66.3 (L) 63.9 - 53.9 %   MCV 84.2 80.0 - 100.0 fL   MCH 27.1 26.0 - 34.0  pg   MCHC 32.1 30.0 - 36.0 g/dL   RDW 85.5 88.4 - 84.4 %   Platelets 279 150 - 400 K/uL   nRBC 0.0 0.0 - 0.2 %   Neutrophils Relative % 98 %   Neutro Abs 27.0 (H) 1.7 - 7.7 K/uL   Lymphocytes Relative 2 %   Lymphs Abs 0.6 (L) 0.7 - 4.0 K/uL   Monocytes Relative 0 %   Monocytes Absolute 0.0 (L) 0.1 - 1.0 K/uL   Eosinophils Relative 0 %   Eosinophils Absolute 0.0 0.0 - 0.5 K/uL   Basophils Relative 0 %   Basophils Absolute 0.0 0.0 - 0.1 K/uL   WBC Morphology See Note    RBC Morphology BURR CELLS    Smear Review See Note    nRBC 0 0 /100 WBC   Abs Immature Granulocytes 0.00 0.00 - 0.07 K/uL   Polychromasia PRESENT   Protime-INR     Status: None   Collection Time: 12/03/23  2:56 AM  Result Value Ref Range   Prothrombin Time 13.6 11.4 - 15.2 seconds   INR 1.0 0.8 - 1.2  hCG, serum, qualitative     Status: None   Collection Time: 12/03/23  2:56 AM  Result Value Ref Range   Preg, Serum NEGATIVE NEGATIVE  I-Stat Lactic Acid, ED     Status: Abnormal   Collection Time: 12/03/23  2:58 AM  Result Value Ref Range   Lactic Acid, Venous 2.1 (HH) 0.5 - 1.9 mmol/L   Comment NOTIFIED PHYSICIAN   Resp panel by RT-PCR (RSV, Flu A&B, Covid) Anterior Nasal Swab     Status: None   Collection Time: 12/03/23  3:50 AM   Specimen: Anterior Nasal Swab  Result Value Ref Range   SARS Coronavirus 2 by RT PCR NEGATIVE NEGATIVE   Influenza A by PCR NEGATIVE NEGATIVE   Influenza B by PCR NEGATIVE NEGATIVE   Resp Syncytial Virus by PCR NEGATIVE NEGATIVE  I-Stat Lactic Acid, ED     Status: None   Collection Time: 12/03/23  4:58 AM  Result Value Ref Range   Lactic Acid, Venous 1.8 0.5 - 1.9 mmol/L     Imaging or Labs ordered: None  Medical history and chart was reviewed and case discussed with medical provider.  Assessment/Plan: 38 year old female with likely necrotizing fasciitis of her left lower extremity.  Patient has clinical findings and laboratory findings that are consistent with a  necrotizing fasciitis.  She has a LRINC score that is significantly elevated having a positive predictive value for the above diagnosis.  She has significant elevated white blood cell count with anemia, acute kidney injury, hyponatremia with elevated inflammatory markers.  We will plan to proceed with irrigation debridement emergently with wound VAC placement.  She will continue on antibiotic regimen and likely need a repeat debridement later this week.  Risks and benefits were discussed with the patient.  I discussed with her that she has high risk of losing her leg and potentially even her life if we do not proceed this morning.  She is in agreement.  Franky MYRTIS Light, MD [facility] [phone] (office) [email or web address]

## 2023-12-03 NOTE — Consult Note (Signed)
 ORTHOPAEDIC CONSULTATION  REQUESTING PHYSICIAN: Caleen Burgess BROCKS, MD  Chief Complaint: Cellulitis left lower extremity.  HPI: Kelly [name] is a 38 y.o. female who presents with cellulitis left lower extremity with sepsis.  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 12/05/2017   Abscess of breast    rt breast   Abscess of right breast 07/13/2014   Acute cystitis without hematuria    ADD (attention deficit disorder)    Back pain    Bacteremia due to Enterococcus 06/15/2018   Bipolar 1 disorder (HCC)    Endocarditis 2020   Foreign body granuloma of soft tissue, NEC, right upper arm    History of hepatitis C 10/01/2018   Hypertension    Infection of left wrist (HCC) 10/26/2020   Influenza B 06/17/2018   Leukocytosis    Multifocal pneumonia 06/14/2018   Polysubstance abuse (HCC)    Pyelonephritis affecting pregnancy 05/24/2016   Sepsis (HCC) 12/02/2017   Suicidal overdose (HCC)    Thigh abscess 10/26/2020   Vaginal discharge 10/26/2020   Past Surgical History:  Procedure Laterality Date   BACK SURGERY     scoliosis-in care everywhere 08/1999   BUBBLE STUDY  11/01/2020   Procedure: BUBBLE STUDY;  Surgeon: Kate Lonni CROME, MD;  Location: [facility] ENDOSCOPY;  Service: Cardiovascular;;   CESAREAN SECTION     CHOLECYSTECTOMY  March 2015   FOREIGN BODY REMOVAL Right 05/28/2016   Procedure: REMOVAL FOREIGN BODY RIGHT ARM;  Surgeon: Addie Glendia Hacker, MD;  Location: [facility] OR;  Service: Orthopedics;  Laterality: Right;   I & D EXTREMITY Left 10/26/2020   Procedure: IRRIGATION AND DEBRIDEMENT LEFT WRIST;  Surgeon: Shari Easter, MD;  Location: [facility] OR;  Service: Orthopedics;  Laterality: Left;   INCISION AND DRAINAGE ABSCESS Right 11/01/2020   Procedure: INCISION AND DRAINAGE ABSCESS RIGHT THIGH X 2;  Surgeon: Teresa Lonni HERO, MD;  Location: [facility] OR;  Service: General;  Laterality: Right;   IRRIGATION AND DEBRIDEMENT ABSCESS Right 07/13/2014    Procedure: IRRIGATION AND DEBRIDEMEN TRIGHT BREAST ABSCESS;  Surgeon: Donnice Lima, MD;  Location: [facility] OR;  Service: General;  Laterality: Right;   LUMBAR LAMINECTOMY/DECOMPRESSION MICRODISCECTOMY N/A 12/05/2017   Procedure: LUMBAR LAMINECTOMY FOR EPIDURAL ABSCESS;  Surgeon: Mavis Purchase, MD;  Location: [facility] OR;  Service: Neurosurgery;  Laterality: N/A;   MINOR IRRIGATION AND DEBRIDEMENT OF WOUND Left 10/26/2020   Procedure: MINOR IRRIGATION AND DEBRIDEMENT OF LEFT BREAST AND BILATERAL THIGHS;  Surgeon: Shari Easter, MD;  Location: [facility] OR;  Service: Orthopedics;  Laterality: Left;   MULTIPLE EXTRACTIONS WITH ALVEOLOPLASTY Bilateral 07/03/2018   Procedure: Extraction of tooth #'s 30 and 31 with alveoloplasty;  Surgeon: Cyndee Tanda FALCON, DDS;  Location: [facility] OR;  Service: Oral Surgery;  Laterality: Bilateral;   RADIOLOGY WITH ANESTHESIA N/A 12/05/2017   Procedure: MRI WITH ANESTHESIA OF [facility] AND LUMBAR SPINE WITH AND WITHOUT;  Surgeon: Radiologist, Medication, MD;  Location: [facility] OR;  Service: Radiology;  Laterality: N/A;   RIGHT/LEFT HEART CATH AND CORONARY ANGIOGRAPHY N/A 05/21/2019   Procedure: RIGHT/LEFT HEART CATH AND CORONARY ANGIOGRAPHY;  Surgeon: Cherrie Toribio SAUNDERS, MD;  Location: [facility] INVASIVE CV LAB;  Service: Cardiovascular;  Laterality: N/A;   TEE WITHOUT CARDIOVERSION N/A 05/21/2019   Procedure: TRANSESOPHAGEAL ECHOCARDIOGRAM (TEE);  Surgeon: Cherrie Toribio SAUNDERS, MD;  Location: [facility] ENDOSCOPY;  Service: Cardiovascular;  Laterality: N/A;   TEE WITHOUT CARDIOVERSION N/A 11/01/2020   Procedure: TRANSESOPHAGEAL ECHOCARDIOGRAM (TEE);  Surgeon: Kate Lonni CROME,  MD;  Location: [facility] ENDOSCOPY;  Service: Cardiovascular;  Laterality: N/A;   TUBAL LIGATION Bilateral 09/30/2020   Procedure: POST PARTUM TUBAL LIGATION;  Surgeon: Herchel Gloris LABOR, MD;  Location: [facility] LD ORS;  Service: Gynecology;  Laterality: Bilateral;   Social History   Socioeconomic History   Marital status: Single    Spouse name:  Not on file   Number of children: Not on file   Years of education: Not on file   Highest education level: Not on file  Occupational History   Occupation: unemployed  Tobacco Use   Smoking status: Former    Current packs/day: 0.00    Average packs/day: 1 pack/day for 13.0 years (13.0 ttl pk-yrs)    Types: Cigarettes    Start date: 06/13/2005    Quit date: 06/13/2018    Years since quitting: 5.4   Smokeless tobacco: Never  Vaping Use   Vaping status: Never Used  Substance and Sexual Activity   Alcohol use: Not Currently    Comment: one or two drinks weekly   Drug use: Yes    Types: Cocaine, Heroin, IV, Fentanyl     Comment: heroin, crack cocaine, fentanyl  last used today (07/13/20)    Sexual activity: Yes    Birth control/protection: Condom  Other Topics Concern   Not on file  Social History Narrative   Not on file   Social Drivers of Health   Financial Resource Strain: High Risk (08/03/2020)   Overall Financial Resource Strain (CARDIA)    Difficulty of Paying Living Expenses: Very hard  Food Insecurity: No Food Insecurity (12/03/2023)   Hunger Vital Sign    Worried About Running Out of Food in the Last Year: Never true    Ran Out of Food in the Last Year: Never true  Transportation Needs: Unmet Transportation Needs (12/03/2023)   PRAPARE - Administrator, Civil Service (Medical): Yes    Lack of Transportation (Non-Medical): Yes  Physical Activity: Not on file  Stress: Not on file  Social Connections: Not on file   Family History  Problem Relation Age of Onset   Diabetes Mother    Diabetes Father    Hypertension Father    Heart attack Neg Hx    Hyperlipidemia Neg Hx    Sudden death Neg Hx    - negative except otherwise stated in the family history section Allergies  Allergen Reactions   Hydrocodone  Itching   Morphine  And Codeine Nausea And Vomiting   Prior to Admission medications   Not on File   CT HUMERUS LEFT WO CONTRAST Result Date:  12/03/2023 CLINICAL DATA:  Left upper extremity swelling, erythema, and pain. Severe sepsis secondary to cellulitis of the left lower extremity. EXAM: CT OF THE UPPER LEFT EXTREMITY WITHOUT CONTRAST TECHNIQUE: Multidetector CT imaging of the upper left extremity (left humerus, forearm, and hand) was performed according to the standard protocol. RADIATION DOSE REDUCTION: This exam was performed according to the departmental dose-optimization program which includes automated exposure control, adjustment of the mA and/or kV according to patient size and/or use of iterative reconstruction technique. COMPARISON:  None Available. FINDINGS: Bones/Joint/Cartilage No fracture or dislocation. No evidence of acute osteolysis. Normal alignment. No sizable joint effusion. Ligaments Ligaments are suboptimally evaluated by CT. Soft tissue Diffuse circumferential subcutaneous edema and stranding extending from the level of the elbow through the hand. Less pronounced subcutaneous edema and stranding extending anteriorly from the shoulder through the elbow. No loculated fluid collection. No soft tissue gas. No discrete intramuscular  collection. Prominent left axillary lymph node measuring up to 10 mm in short axis is likely reactive. IMPRESSION: 1. Diffuse circumferential subcutaneous edema and stranding extending from the level of the elbow through the hand and less pronounced subcutaneous edema and stranding extending anteriorly from the shoulder through the elbow, suspicious for cellulitis. No loculated fluid collection. No soft tissue gas. 2. No acute osseous abnormality. Electronically Signed   By: Harrietta Sherry M.D.   On: 12/03/2023 13:30   CT FOREARM LEFT WO CONTRAST Result Date: 12/03/2023 CLINICAL DATA:  Left upper extremity swelling, erythema, and pain. Severe sepsis secondary to cellulitis of the left lower extremity. EXAM: CT OF THE UPPER LEFT EXTREMITY WITHOUT CONTRAST TECHNIQUE: Multidetector CT imaging of the  upper left extremity (left humerus, forearm, and hand) was performed according to the standard protocol. RADIATION DOSE REDUCTION: This exam was performed according to the departmental dose-optimization program which includes automated exposure control, adjustment of the mA and/or kV according to patient size and/or use of iterative reconstruction technique. COMPARISON:  None Available. FINDINGS: Bones/Joint/Cartilage No fracture or dislocation. No evidence of acute osteolysis. Normal alignment. No sizable joint effusion. Ligaments Ligaments are suboptimally evaluated by CT. Soft tissue Diffuse circumferential subcutaneous edema and stranding extending from the level of the elbow through the hand. Less pronounced subcutaneous edema and stranding extending anteriorly from the shoulder through the elbow. No loculated fluid collection. No soft tissue gas. No discrete intramuscular collection. Prominent left axillary lymph node measuring up to 10 mm in short axis is likely reactive. IMPRESSION: 1. Diffuse circumferential subcutaneous edema and stranding extending from the level of the elbow through the hand and less pronounced subcutaneous edema and stranding extending anteriorly from the shoulder through the elbow, suspicious for cellulitis. No loculated fluid collection. No soft tissue gas. 2. No acute osseous abnormality. Electronically Signed   By: Harrietta Sherry M.D.   On: 12/03/2023 13:30   CT HAND LEFT WO CONTRAST Result Date: 12/03/2023 CLINICAL DATA:  Left upper extremity swelling, erythema, and pain. Severe sepsis secondary to cellulitis of the left lower extremity. EXAM: CT OF THE UPPER LEFT EXTREMITY WITHOUT CONTRAST TECHNIQUE: Multidetector CT imaging of the upper left extremity (left humerus, forearm, and hand) was performed according to the standard protocol. RADIATION DOSE REDUCTION: This exam was performed according to the departmental dose-optimization program which includes automated exposure  control, adjustment of the mA and/or kV according to patient size and/or use of iterative reconstruction technique. COMPARISON:  None Available. FINDINGS: Bones/Joint/Cartilage No fracture or dislocation. No evidence of acute osteolysis. Normal alignment. No sizable joint effusion. Ligaments Ligaments are suboptimally evaluated by CT. Soft tissue Diffuse circumferential subcutaneous edema and stranding extending from the level of the elbow through the hand. Less pronounced subcutaneous edema and stranding extending anteriorly from the shoulder through the elbow. No loculated fluid collection. No soft tissue gas. No discrete intramuscular collection. Prominent left axillary lymph node measuring up to 10 mm in short axis is likely reactive. IMPRESSION: 1. Diffuse circumferential subcutaneous edema and stranding extending from the level of the elbow through the hand and less pronounced subcutaneous edema and stranding extending anteriorly from the shoulder through the elbow, suspicious for cellulitis. No loculated fluid collection. No soft tissue gas. 2. No acute osseous abnormality. Electronically Signed   By: Harrietta Sherry M.D.   On: 12/03/2023 13:30   US  RENAL Result Date: 12/03/2023 CLINICAL DATA:  Acute kidney injury EXAM: RENAL / URINARY TRACT ULTRASOUND COMPLETE COMPARISON:  CT scan 02/10/2021 FINDINGS: Right Kidney:  Renal measurements: 9.0 by 4.0 by 5.0 cm = volume: 94 mL. Limited assessment due to overlying bowel gas. Visualization of the lower pole was particularly limited, and as result renal length may be inaccurate. Echogenicity within normal limits. No mass or hydronephrosis visualized. Left Kidney: Renal measurements: 13.0 by 5.1 by 5.8 cm = volume: 202 mL. Echogenicity within normal limits. No mass or hydronephrosis visualized. Bladder: Distended bladder, volume 734 cc. Other: None. IMPRESSION: 1. Distended bladder, volume 734 cc. 2. No hydronephrosis. 3. Limited assessment of the right kidney due  to overlying bowel gas. Visualization of the lower pole was particularly limited, and as result renal length may be inaccurate. Electronically Signed   By: Ryan Salvage M.D.   On: 12/03/2023 13:04   CT FOOT LEFT W CONTRAST Result Date: 12/03/2023 EXAM: CT LEFT FOOT, WITH IV CONTRAST 12/03/2023 03:35:58 AM TECHNIQUE: Axial images were acquired through the left foot with IV contrast. Reformatted images were reviewed. Automated exposure control, iterative reconstruction, and/or weight based adjustment of the mA/kV was utilized to reduce the radiation dose to as low as reasonably achievable. COMPARISON: None available. CLINICAL HISTORY: Osteomyelitis suspected, foot, xray done; ?nec fasc. 75cc [id number]; Pt reports leg edema and redness and left arm swelling and redness. Started 2 days ago but progressed quickly over last few hours. Endorses IV drug use in thigh but states been 2 months. Recently incarcerated but been out for about a month. Hx of endocarditis. FINDINGS: BONES AND JOINTS: No acute fracture or focal osseous lesion. No dislocation. No cortical destruction to suggest osteomyelitis. The joint spaces are normal. SOFT TISSUES: Extensive subcutaneous fluid/edema along the dorsal foot. No drainable fluid collection/abscess. No soft tissue gas to suggest necrotizing fasciitis. IMPRESSION: 1. Extensive subcutaneous fluid/edema along the dorsal foot. 2. No drainable fluid collection/abscess. 3. No soft tissue gas to suggest necrotizing fasciitis. Electronically signed by: Pinkie Pebbles MD 12/03/2023 03:49 AM EDT RP Workstation: [id number]   CT TIBIA FIBULA LEFT W CONTRAST Result Date: 12/03/2023 EXAM: CT Left Lower Extremity, With IV Contrast 12/03/2023 03:35:58 AM TECHNIQUE: Axial images were acquired through the left lower extremity with IV contrast. Reformatted images were reviewed. Automated exposure control, iterative reconstruction, and/or weight based adjustment of the mA/kV was utilized to  reduce the radiation dose to as low as reasonably achievable. COMPARISON: None available. CLINICAL HISTORY: Soft tissue infection suspected, lower leg, xray done. Pt reports leg edema and redness and left arm swelling and redness. Started 2 days ago but progressed quickly over last few hours. Endorses IV drug use in thigh but states been 2 months. Recently incarcerated but been out for about a month. Hx of endocarditis. FINDINGS: BONES AND JOINTS: No acute fracture or focal osseous lesion. No dislocation. The joint spaces are normal. SOFT TISSUES: Circumferential subcutaneous stranding/edema, most prominent at the mid/distal calf. No drainable fluid collection/abscess. VASCULATURE: Vessels remain patent. IMPRESSION: 1. Circumferential subcutaneous stranding/edema, suggesting cellulitis. 2. No drainable fluid collection/abscess. Electronically signed by: Pinkie Pebbles MD 12/03/2023 03:46 AM EDT RP Workstation: [id number]   CT FEMUR LEFT W CONTRAST Result Date: 12/03/2023 EXAM: CT OF THE LEFT FEMUR, WITH IV CONTRAST 12/03/2023 03:35:58 AM TECHNIQUE: Axial images were acquired through the left femur with IV contrast. Reformatted images were reviewed. Automated exposure control, iterative reconstruction, and/or weight based adjustment of the mA/kV was utilized to reduce the radiation dose to as low as reasonably achievable. COMPARISON: None available. CLINICAL HISTORY: Soft tissue infection suspected, thigh, xray done. Pt reports leg edema and redness and left arm  swelling and redness. Started 2 days ago but progressed quickly over last few hours. Endorses IV drug use in thigh but states been 2 months. Recently incarcerated but been out for about a month. Hx of endocarditis. FINDINGS: BONES: No acute fracture or focal osseous lesion. JOINTS: No dislocation. The joint spaces are normal. SOFT TISSUES: Mild subcutaneous stranding/edema most prominent along the lateral mid/lower thigh. No drainable fluid  collection/abscess. 16 mm short axis left inguinal node (image 57), likely reactive. VASCULATURE: Vessels remain patent. IMPRESSION: 1. Mild subcutaneous stranding/edema, suggesting cellulitis. 2. No drainable fluid collection or abscess. Electronically signed by: Pinkie Pebbles MD 12/03/2023 03:45 AM EDT RP Workstation: [id number]   DG Chest Port 1 View Result Date: 12/03/2023 CLINICAL DATA:  Leg edema and redness with left arm swelling and redness x2 days. EXAM: PORTABLE CHEST 1 VIEW COMPARISON:  January 19, 2021 FINDINGS: The heart size and mediastinal contours are within normal limits. Mild, stable linear scarring is seen along the periphery of the right lung base. No acute infiltrate, pleural effusion or pneumothorax is identified. Stable postoperative changes are noted throughout the thoracolumbar spine. The visualized skeletal structures are otherwise unremarkable. IMPRESSION: No active cardiopulmonary disease. Electronically Signed   By: Suzen Dials M.D.   On: 12/03/2023 03:09   - pertinent xrays, CT, MRI studies were reviewed and independently interpreted  Positive ROS: All other systems have been reviewed and were otherwise negative with the exception of those mentioned in the HPI and as above.  Physical Exam: General: Not alert, in acute distress Psychiatric: Patient is not responsive to questioning.  Lymphatic: No axillary or cervical lymphadenopathy Cardiovascular: No pedal edema Respiratory: No cyanosis, no use of accessory musculature GI: No organomegaly, abdomen is soft and non-tender    Images:  @ENCIMAGES @  Labs:  Lab Results  Component Value Date   HGBA1C 4.6 (L) 07/13/2020   HGBA1C 5.1 06/26/2019   HGBA1C 5.3 09/04/2018   ESRSEDRATE >140 (H) 08/09/2020   ESRSEDRATE 65 (H) 01/09/2018   ESRSEDRATE >140 (H) 12/17/2017   CRP 0.8 02/17/2021   CRP 2.1 (H) 02/10/2021   CRP 7.0 (H) 02/03/2021   LABURIC 5.4 02/07/2021   LABURIC 7.1 **Please note change in  reference range.** (H) 06/17/2007   LABURIC 6.1 **Please note change in reference range.** 06/03/2007   REPTSTATUS PENDING 12/03/2023   GRAMSTAIN  01/26/2021    ABUNDANT WBC PRESENT, PREDOMINANTLY PMN FEW GRAM POSITIVE COCCI IN CLUSTERS    CULT  12/03/2023    NO GROWTH < 12 HOURS Performed at [facility], [address], [place], [place] [address]    LABORGA METHICILLIN RESISTANT STAPHYLOCOCCUS AUREUS 01/26/2021    Lab Results  Component Value Date   ALBUMIN 2.6 (L) 12/03/2023   ALBUMIN 1.5 (L) 01/26/2021   ALBUMIN 1.8 (L) 01/19/2021   LABURIC 5.4 02/07/2021   LABURIC 7.1 **Please note change in reference range.** (H) 06/17/2007   LABURIC 6.1 **Please note change in reference range.** 06/03/2007        Latest Ref Rng & Units 12/03/2023   11:08 AM 12/03/2023    2:56 AM 02/22/2021    3:40 AM  CBC EXTENDED  WBC 4.0 - 10.5 K/uL 18.5  27.6  6.8   RBC 3.87 - 5.11 MIL/uL 3.41  3.99  3.56   Hemoglobin 12.0 - 15.0 g/dL 9.2  89.1  8.7   HCT 63.9 - 46.0 % 28.7  33.6  30.2   Platelets 150 - 400 K/uL 230  279  373  NEUT# 1.7 - 7.7 K/uL  27.0    Lymph# 0.7 - 4.0 K/uL  0.6      Neurologic: Patient does not have protective sensation bilateral lower extremities.   MUSCULOSKELETAL:   Skin: Examination patient is status post surgical debridement for necrotizing fasciitis left lower extremity with medial and lateral incisions.  The leg is wrapped.  There is clear serosanguineous drainage in the wound VAC canister with 175 cc of drainage since surgery today.  LRINEC score is 8.  She received 2 points for sodium 2 points for creatinine 2 points for white cell count and 2 points for hemoglobin.  Inflammatory markers were not obtained.  Patient's absolute neutrophil to lymphocyte count is 45.  Albumin 2.6.  Assessment: Assessment necrotizing fasciitis status post initial debridement today.  Plan: I will plan to return to the operating room on Friday for further debridement.  Will  follow patient daily to see if intervention sooner is necessary.  I will plan for tissue cultures at surgery.  Thank you for the consult and the opportunity to see Ms. Germaine Jerona Sage, MD [facility] [phone] 5:56 PM

## 2023-12-03 NOTE — ED Provider Notes (Signed)
 High clinical concern for deep space infection or necrotizing fasciitis warranting immediate CT imaging.  Will need the CT scan regardless of labs and or hCG status.  Proceeding with scan prior to labs.   Theadore Ozell HERO, MD 12/03/23 [phone]

## 2023-12-04 ENCOUNTER — Encounter (HOSPITAL_COMMUNITY): Payer: Self-pay | Admitting: Student

## 2023-12-04 ENCOUNTER — Inpatient Hospital Stay (HOSPITAL_COMMUNITY): Payer: MEDICAID

## 2023-12-04 DIAGNOSIS — R012 Other cardiac sounds: Secondary | ICD-10-CM

## 2023-12-04 DIAGNOSIS — L0390 Cellulitis, unspecified: Secondary | ICD-10-CM | POA: Diagnosis not present

## 2023-12-04 LAB — CBC
HCT: 28.3 % — ABNORMAL LOW (ref 36.0–46.0)
Hemoglobin: 9.3 g/dL — ABNORMAL LOW (ref 12.0–15.0)
MCH: 27.2 pg (ref 26.0–34.0)
MCHC: 32.9 g/dL (ref 30.0–36.0)
MCV: 82.7 fL (ref 80.0–100.0)
Platelets: 191 K/uL (ref 150–400)
RBC: 3.42 MIL/uL — ABNORMAL LOW (ref 3.87–5.11)
RDW: 14.8 % (ref 11.5–15.5)
WBC: 18.2 K/uL — ABNORMAL HIGH (ref 4.0–10.5)
nRBC: 0 % (ref 0.0–0.2)

## 2023-12-04 LAB — ECHOCARDIOGRAM COMPLETE
Area-P 1/2: 3.99 cm2
Height: 67 in
S' Lateral: 3 cm
Weight: 2821.89 [oz_av]

## 2023-12-04 LAB — PHOSPHORUS: Phosphorus: 3.4 mg/dL (ref 2.5–4.6)

## 2023-12-04 LAB — BASIC METABOLIC PANEL WITH GFR
Anion gap: 11 (ref 5–15)
BUN: 22 mg/dL — ABNORMAL HIGH (ref 6–20)
CO2: 21 mmol/L — ABNORMAL LOW (ref 22–32)
Calcium: 8.2 mg/dL — ABNORMAL LOW (ref 8.9–10.3)
Chloride: 98 mmol/L (ref 98–111)
Creatinine, Ser: 0.91 mg/dL (ref 0.44–1.00)
GFR, Estimated: >60 mL/min (ref >60)
Glucose, Bld: 90 mg/dL (ref 70–99)
Potassium: 3.3 mmol/L — ABNORMAL LOW (ref 3.5–5.1)
Sodium: 130 mmol/L — ABNORMAL LOW (ref 135–145)

## 2023-12-04 LAB — MAGNESIUM: Magnesium: 1.7 mg/dL (ref 1.7–2.4)

## 2023-12-04 MED ORDER — POTASSIUM CHLORIDE CRYS ER 20 MEQ PO TBCR
40.0000 meq | EXTENDED_RELEASE_TABLET | Freq: Once | ORAL | Status: AC
  Administered 2023-12-04: 40 meq via ORAL
  Filled 2023-12-04: qty 2

## 2023-12-04 MED ORDER — METHADONE HCL 10 MG PO TABS
20.0000 mg | ORAL_TABLET | Freq: Every day | ORAL | Status: DC
  Administered 2023-12-04 – 2023-12-07 (×4): 20 mg via ORAL
  Filled 2023-12-04 (×4): qty 2

## 2023-12-04 NOTE — Progress Notes (Signed)
 Patient ID: Kelly [name], female   DOB: [birth year], 38 y.o.   MRN: [id number] Patient is seen in follow-up for necrotizing fasciitis left lower extremity.  Examination patient has increased swelling in the left upper extremity.  The wound VAC was turned off there is 150 cc of clear serosanguineous fluid in the wound VAC canister.  I turned the wound VAC back on it had a good suction fit.  Plan for repeat debridement of the left lower extremity on Friday.

## 2023-12-04 NOTE — Progress Notes (Signed)
 MEDICATION RELATED CONSULT NOTE  Pharmacy Consult for methadone  initiation Indication: opioid use disorder  Allergies  Allergen Reactions   Hydrocodone  Itching   Morphine  And Codeine Nausea And Vomiting    Patient Measurements: Height: 5' 7 (170.2 cm) Weight: 80 kg (176 lb 5.9 oz) IBW/kg (Calculated) : 61.6  Vital Signs: Temp: 97.9 F (36.6 C) (07/23 0741) Temp Source: Oral (07/23 0741) BP: 110/62 (07/23 0741) Pulse Rate: 83 (07/23 0741)   Estimated Creatinine Clearance: 90.4 mL/min (by C-G formula based on SCr of 0.91 mg/dL).  Assessment: 38 y.o. 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   Previously (2022) was stable and following with outpatient methadone  clinic. Has since relapsed and has been lost to follow-up. Patient now asking to be reinitiated of methadone  treatment for OUD. Previously required doses ~100-150mg /day. Will reinitiate at low dose with goal of titrating more rapidly.   Plan:  Start methadone  20mg  PO daily  Reassess toleration, withdrawal s/sx  Increase as needed in 2-3 days   Thank you for allowing pharmacy to be a part of this patient's care.  Shelba Collier, PharmD, BCPS Clinical Pharmacist

## 2023-12-04 NOTE — Progress Notes (Signed)
  Echocardiogram 2D Echocardiogram has been performed.  Koleen KANDICE Popper, RDCS 12/04/2023, 9:58 AM

## 2023-12-04 NOTE — Plan of Care (Signed)

## 2023-12-04 NOTE — Plan of Care (Signed)
   Problem: Nutrition: Goal: Adequate nutrition will be maintained Outcome: Progressing

## 2023-12-04 NOTE — Progress Notes (Signed)
 PROGRESS NOTE Kelly [name]  FMW:[id number] DOB: [birth year] DOA: 12/03/2023 PCP: Pcp, No  Brief Narrative/Hospital Course: 38 y.o. 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 difficile infection, bipolar disorder, hep C, hypertension, anemia presenting with complaints of rapidly progressive left leg erythema, pain, and swelling x 2 days PTA. In ED found to be septic, CT scan showed concerns of cellulitis of left lower extremity along with necrotizing fasciitis.she was started on broad-spectrum antibiotics and orthopedic consulted who performed excisional debridement on 12/03/22.  Subjective: Seen examined Co pain on LLE Asking to be in methadone  was takign 1-2 yr ago abt 100 mg and was going to [employer] but she relapseed Overnight afebrile BP stable in 110s, on room air Labs pending this morning -subsequently resulted potassium 3.3 WBC 18  Assessment & Plan:   Severe sepsis POA  Necrotizing fasciitis of left lower extremity with cellulitis IVDA; CT scan is concerning for necrotizing fasciitis therefore underwent excisional debridement by orthopedic on 7/22. Dr. Harden following continue wound VAC, wound care.  Plan for repeat debridement of the left lower extremity on Friday Monitor labs, continue Zyvox , Zosyn  pain management. Blood culture from admission no growth so far.   AKI with metabolic acidosis BUN 46, creatinine 2.3 (previously 0.5 on labs done in October 2022).Renal ultrasound-no hydronephrosis.  Distended bladder. Monitor labs. Cont ivf.   Left upper extremity swelling, erythema, pain Currently on IV antibiotics.CT of left upper extremity-diffuse circumferential subcutaneous edema and stranding extending from the level of the elbow through the hand no loculated fluid collection no soft  tissue gas.  Elevate continue to Abx   Hyponatremia Hypokalemia Hypomagnesemia: Monitor and replace electrolytes. Recent Labs  Lab 12/03/23 0256 12/03/23 1108 12/04/23 0836  K 3.1* 3.0* 3.3*  CALCIUM  8.5* 8.1* 8.2*  MG  --  1.0* 1.7  PHOS  --   --  3.4    No results found for: PTH     HFmrEF History of severe TR and pulmonary hypertension Last echo done in September 2022 showing EF 40 to 45%.  No signs of volume overload, appears to be compensated.  repeat echocardiogram pending    Ongoing substance abuse/IV drug abuse TOC consulted for counseling.  Patient reports she has been snorting and has not recently used IV drugs.  Reports being on methadone  and asking if she can resume.    DVT prophylaxis: SCDs Start: 12/03/23 0617 Code Status:   Code Status: Full Code Family Communication: plan of care discussed with patient at bedside. Patient status is: Remains hospitalized because of severity of illness Level of care: Med-Surg   Dispo: The patient is from: home            Anticipated disposition: TBD Objective: Vitals last 24 hrs: Vitals:   12/03/23 2016 12/04/23 0001 12/04/23 0427 12/04/23 0741  BP: (!) 109/58 (!) 105/59 (!) 101/54 110/62  Pulse: 96 87 80 83  Resp: 16 16 15 15   Temp: 98.1 F (36.7 C) 97.6 F (36.4 C) 98.3 F (36.8 C) 97.9 F (36.6 C)  TempSrc: Oral Oral Oral Oral  SpO2: 97% 96% 100% 98%  Weight:      Height:        Physical Examination: General exam: alert awake, older than stated age HEENT:Oral mucosa moist, Ear/Nose WNL grossly Respiratory system: Bilaterally clear BS, no use of accessory muscle Cardiovascular system: S1 & S2 +.  Gastrointestinal system: Abdomen soft,  NT,ND,BS+ Nervous System: Alert, awake, following commands. Extremities: LE edema neg, left lower extremity with wound VAC in place, warm extremities Skin: No rashes,warm. MSK: Normal muscle bulk/tone.   Data Reviewed: I have personally reviewed following labs and imaging  studies ( see epic result tab) CBC: Recent Labs  Lab 12/03/23 0256 12/03/23 1108 12/04/23 0836  WBC 27.6* 18.5* 18.2*  NEUTROABS 27.0*  --   --   HGB 10.8* 9.2* 9.3*  HCT 33.6* 28.7* 28.3*  MCV 84.2 84.2 82.7  PLT 279 230 191   CMP: Recent Labs  Lab 12/03/23 0256 12/03/23 1108 12/04/23 0836  NA 127* 134* 130*  K 3.1* 3.0* 3.3*  CL 94* 100 98  CO2 19* 18* 21*  GLUCOSE 111* 79 90  BUN 46* 39* 22*  CREATININE 2.30* 1.55* 0.91  CALCIUM  8.5* 8.1* 8.2*  MG  --  1.0* 1.7  PHOS  --   --  3.4   GFR: Estimated Creatinine Clearance: 90.4 mL/min (by C-G formula based on SCr of 0.91 mg/dL). Recent Labs  Lab 12/03/23 0256  AST 29  ALT 10  ALKPHOS 121  BILITOT 1.0  PROT 6.7  ALBUMIN 2.6*   No results for input(s): LIPASE, AMYLASE in the last 168 hours. No results for input(s): AMMONIA in the last 168 hours. Coagulation Profile:  Recent Labs  Lab 12/03/23 0256  INR 1.0   Unresulted Labs (From admission, onward)     Start     Ordered   12/04/23 0500  Basic metabolic panel with GFR  Daily,   R      12/03/23 0908   12/04/23 0500  CBC  Daily,   R      12/03/23 0908   12/04/23 0500  Magnesium   Daily,   R      12/03/23 0908           Antimicrobials/Microbiology: Anti-infectives (From admission, onward)    Start     Dose/Rate Route Frequency Ordered Stop   12/03/23 1230  linezolid  (ZYVOX ) IVPB 600 mg        600 mg 300 mL/hr over 60 Minutes Intravenous Every 12 hours 12/03/23 1204     12/03/23 1100  clindamycin  (CLEOCIN ) IVPB 900 mg  Status:  Discontinued        900 mg 100 mL/hr over 30 Minutes Intravenous Every 8 hours 12/03/23 0619 12/03/23 1204   12/03/23 1000  piperacillin -tazobactam (ZOSYN ) IVPB 3.375 g        3.375 g 12.5 mL/hr over 240 Minutes Intravenous Every 8 hours 12/03/23 0652     12/03/23 0652  vancomycin  variable dose per unstable renal function (pharmacist dosing)  Status:  Discontinued         Does not apply See admin instructions 12/03/23  0652 12/03/23 1204   12/03/23 0245  piperacillin -tazobactam (ZOSYN ) IVPB 3.375 g        3.375 g 100 mL/hr over 30 Minutes Intravenous  Once 12/03/23 0237 12/03/23 0338   12/03/23 0245  clindamycin  (CLEOCIN ) IVPB 900 mg        900 mg 100 mL/hr over 30 Minutes Intravenous  Once 12/03/23 0237 12/03/23 0338   12/03/23 0245  vancomycin  (VANCOCIN ) 1,750 mg in sodium chloride  0.9 % 500 mL IVPB  Status:  Discontinued        1,750 mg 258.8 mL/hr over 120 Minutes Intravenous  Once 12/03/23 0241 12/03/23 0242   12/03/23 0245  vancomycin  (VANCOREADY) IVPB 1750 mg/350 mL  1,750 mg 175 mL/hr over 120 Minutes Intravenous Once 12/03/23 0243 12/03/23 0548         Component Value Date/Time   SDES BLOOD BLOOD RIGHT ARM 12/03/2023 0258   SPECREQUEST  12/03/2023 0258    BOTTLES DRAWN AEROBIC AND ANAEROBIC Blood Culture adequate volume   CULT  12/03/2023 0258    NO GROWTH 1 DAY Performed at [facility], [address] [place], [place] [address]    REPTSTATUS PENDING 12/03/2023 9741    Procedures: Procedure(s) (LRB): FASCIOTOMY, INCISION AND DRAINAGE NECROTIZING FASCITIS (Left) Medications reviewed:  Scheduled Meds:  Chlorhexidine  Gluconate Cloth  6 each Topical Daily   mupirocin  ointment  1 Application Nasal BID   Continuous Infusions:  linezolid  (ZYVOX ) IV Stopped (12/04/23 1100)   piperacillin -tazobactam (ZOSYN )  IV 3.375 g (12/04/23 1116)    Mennie LAMY, MD [facility] 12/04/2023, 11:19 AM

## 2023-12-05 ENCOUNTER — Inpatient Hospital Stay (HOSPITAL_COMMUNITY): Payer: MEDICAID | Admitting: Anesthesiology

## 2023-12-05 ENCOUNTER — Encounter (HOSPITAL_COMMUNITY)
Admission: EM | Disposition: A | Discharge status: Home / Self Care | Payer: Self-pay | Source: Home / Self Care | Attending: Internal Medicine

## 2023-12-05 ENCOUNTER — Encounter (HOSPITAL_COMMUNITY): Payer: Self-pay | Admitting: Internal Medicine

## 2023-12-05 ENCOUNTER — Other Ambulatory Visit: Payer: Self-pay

## 2023-12-05 DIAGNOSIS — F319 Bipolar disorder, unspecified: Secondary | ICD-10-CM

## 2023-12-05 DIAGNOSIS — I5023 Acute on chronic systolic (congestive) heart failure: Secondary | ICD-10-CM | POA: Diagnosis not present

## 2023-12-05 DIAGNOSIS — Z87891 Personal history of nicotine dependence: Secondary | ICD-10-CM | POA: Diagnosis not present

## 2023-12-05 DIAGNOSIS — L0390 Cellulitis, unspecified: Secondary | ICD-10-CM | POA: Diagnosis not present

## 2023-12-05 DIAGNOSIS — L03114 Cellulitis of left upper limb: Secondary | ICD-10-CM | POA: Diagnosis not present

## 2023-12-05 DIAGNOSIS — I110 Hypertensive heart disease with heart failure: Secondary | ICD-10-CM

## 2023-12-05 HISTORY — PX: INCISION AND DRAINAGE OF WOUND: SHX1803

## 2023-12-05 LAB — CBC
HCT: 26.4 % — ABNORMAL LOW (ref 36.0–46.0)
Hemoglobin: 8.3 g/dL — ABNORMAL LOW (ref 12.0–15.0)
MCH: 26.4 pg (ref 26.0–34.0)
MCHC: 31.4 g/dL (ref 30.0–36.0)
MCV: 84.1 fL (ref 80.0–100.0)
Platelets: 209 K/uL (ref 150–400)
RBC: 3.14 MIL/uL — ABNORMAL LOW (ref 3.87–5.11)
RDW: 15.2 % (ref 11.5–15.5)
WBC: 18 K/uL — ABNORMAL HIGH (ref 4.0–10.5)
nRBC: 0 % (ref 0.0–0.2)

## 2023-12-05 LAB — RAPID URINE DRUG SCREEN, HOSP PERFORMED
Amphetamines: POSITIVE — AB
Barbiturates: NOT DETECTED
Benzodiazepines: POSITIVE — AB
Cocaine: POSITIVE — AB
Opiates: POSITIVE — AB
Tetrahydrocannabinol: NOT DETECTED

## 2023-12-05 LAB — BASIC METABOLIC PANEL WITH GFR
Anion gap: 12 (ref 5–15)
BUN: 18 mg/dL (ref 6–20)
CO2: 22 mmol/L (ref 22–32)
Calcium: 8.2 mg/dL — ABNORMAL LOW (ref 8.9–10.3)
Chloride: 99 mmol/L (ref 98–111)
Creatinine, Ser: 0.79 mg/dL (ref 0.44–1.00)
GFR, Estimated: >60 mL/min (ref >60)
Glucose, Bld: 77 mg/dL (ref 70–99)
Potassium: 3.9 mmol/L (ref 3.5–5.1)
Sodium: 133 mmol/L — ABNORMAL LOW (ref 135–145)

## 2023-12-05 LAB — MAGNESIUM: Magnesium: 1.5 mg/dL — ABNORMAL LOW (ref 1.7–2.4)

## 2023-12-05 SURGERY — IRRIGATION AND DEBRIDEMENT WOUND
Anesthesia: General | Laterality: Left

## 2023-12-05 MED ORDER — MAGNESIUM OXIDE -MG SUPPLEMENT 400 (240 MG) MG PO TABS
400.0000 mg | ORAL_TABLET | Freq: Two times a day (BID) | ORAL | Status: AC
  Administered 2023-12-05 – 2023-12-07 (×5): 400 mg via ORAL
  Filled 2023-12-05 (×6): qty 1

## 2023-12-05 MED ORDER — SUCCINYLCHOLINE CHLORIDE 200 MG/10ML IV SOSY
PREFILLED_SYRINGE | INTRAVENOUS | Status: AC
Start: 2023-12-05 — End: 2023-12-05
  Filled 2023-12-05: qty 10

## 2023-12-05 MED ORDER — BUPIVACAINE HCL (PF) 0.25 % IJ SOLN
INTRAMUSCULAR | Status: AC
  Filled 2023-12-05: qty 30

## 2023-12-05 MED ORDER — ETOMIDATE 2 MG/ML IV SOLN
INTRAVENOUS | Status: AC
Start: 2023-12-05 — End: 2023-12-05
  Filled 2023-12-05: qty 10

## 2023-12-05 MED ORDER — CHLORHEXIDINE GLUCONATE 0.12 % MT SOLN
OROMUCOSAL | Status: AC
  Administered 2023-12-05: 15 mL via OROMUCOSAL
  Filled 2023-12-05: qty 15

## 2023-12-05 MED ORDER — SUCCINYLCHOLINE CHLORIDE 200 MG/10ML IV SOSY
PREFILLED_SYRINGE | INTRAVENOUS | Status: DC | PRN
  Administered 2023-12-05: 120 mg via INTRAVENOUS

## 2023-12-05 MED ORDER — MIDAZOLAM HCL 2 MG/2ML IJ SOLN
INTRAMUSCULAR | Status: DC | PRN
  Administered 2023-12-05: 2 mg via INTRAVENOUS

## 2023-12-05 MED ORDER — ORAL CARE MOUTH RINSE: 15.0000 mL | Freq: Once | OROMUCOSAL | Status: AC

## 2023-12-05 MED ORDER — ETOMIDATE 2 MG/ML IV SOLN
INTRAVENOUS | Status: DC | PRN
  Administered 2023-12-05: 16 mg via INTRAVENOUS

## 2023-12-05 MED ORDER — ONDANSETRON HCL 4 MG/2ML IJ SOLN
INTRAMUSCULAR | Status: DC | PRN
  Administered 2023-12-05: 4 mg via INTRAVENOUS

## 2023-12-05 MED ORDER — CHLORHEXIDINE GLUCONATE 0.12 % MT SOLN: 15.0000 mL | Freq: Once | OROMUCOSAL | Status: AC

## 2023-12-05 MED ORDER — VANCOMYCIN HCL IN DEXTROSE 1-5 GM/200ML-% IV SOLN
1000.0000 mg | INTRAVENOUS | Status: AC
  Administered 2023-12-06: 1000 mg via INTRAVENOUS
  Filled 2023-12-05 (×2): qty 200

## 2023-12-05 MED ORDER — PROPOFOL 10 MG/ML IV BOLUS
INTRAVENOUS | Status: AC
  Filled 2023-12-05: qty 20

## 2023-12-05 MED ORDER — DEXAMETHASONE SODIUM PHOSPHATE 10 MG/ML IJ SOLN
INTRAMUSCULAR | Status: AC
Start: 2023-12-05 — End: 2023-12-05
  Filled 2023-12-05: qty 1

## 2023-12-05 MED ORDER — FENTANYL CITRATE (PF) 100 MCG/2ML IJ SOLN
25.0000 ug | INTRAMUSCULAR | Status: DC | PRN
  Administered 2023-12-05 (×3): 50 ug via INTRAVENOUS

## 2023-12-05 MED ORDER — DEXAMETHASONE SODIUM PHOSPHATE 10 MG/ML IJ SOLN
INTRAMUSCULAR | Status: DC | PRN
  Administered 2023-12-05: 5 mg via INTRAVENOUS

## 2023-12-05 MED ORDER — VANCOMYCIN HCL IN DEXTROSE 1-5 GM/200ML-% IV SOLN: 1000.0000 mg | INTRAVENOUS | Status: DC

## 2023-12-05 MED ORDER — PHENYLEPHRINE 80 MCG/ML (10ML) SYRINGE FOR IV PUSH (FOR BLOOD PRESSURE SUPPORT)
PREFILLED_SYRINGE | INTRAVENOUS | Status: AC
Start: 2023-12-05 — End: 2023-12-05
  Filled 2023-12-05: qty 10

## 2023-12-05 MED ORDER — CEFAZOLIN SODIUM-DEXTROSE 2-4 GM/100ML-% IV SOLN
2.0000 g | INTRAVENOUS | Status: DC
  Filled 2023-12-05 (×2): qty 100

## 2023-12-05 MED ORDER — FENTANYL CITRATE (PF) 250 MCG/5ML IJ SOLN
INTRAMUSCULAR | Status: DC | PRN
  Administered 2023-12-05 (×5): 50 ug via INTRAVENOUS

## 2023-12-05 MED ORDER — CHLORHEXIDINE GLUCONATE 4 % EX SOLN: 60.0000 mL | Freq: Once | CUTANEOUS | Status: DC

## 2023-12-05 MED ORDER — LACTATED RINGERS IV SOLN: INTRAVENOUS | Status: DC | PRN

## 2023-12-05 MED ORDER — POVIDONE-IODINE 10 % EX SWAB: 2.0000 | Freq: Once | CUTANEOUS | Status: DC

## 2023-12-05 MED ORDER — POVIDONE-IODINE 10 % EX SWAB
2.0000 | Freq: Once | CUTANEOUS | Status: DC
  Administered 2023-12-05: 2 via TOPICAL

## 2023-12-05 MED ORDER — LIDOCAINE 2% (20 MG/ML) 5 ML SYRINGE
INTRAMUSCULAR | Status: AC
  Filled 2023-12-05: qty 5

## 2023-12-05 MED ORDER — FENTANYL CITRATE (PF) 100 MCG/2ML IJ SOLN
INTRAMUSCULAR | Status: AC
Start: 2023-12-05 — End: 2023-12-05
  Filled 2023-12-05: qty 2

## 2023-12-05 MED ORDER — LIDOCAINE 2% (20 MG/ML) 5 ML SYRINGE
INTRAMUSCULAR | Status: DC | PRN
  Administered 2023-12-05: 80 mg via INTRAVENOUS

## 2023-12-05 MED ORDER — SODIUM CHLORIDE 0.9 % IR SOLN
Status: DC | PRN
  Administered 2023-12-05: 3000 mL

## 2023-12-05 MED ORDER — PHENYLEPHRINE 80 MCG/ML (10ML) SYRINGE FOR IV PUSH (FOR BLOOD PRESSURE SUPPORT)
PREFILLED_SYRINGE | INTRAVENOUS | Status: DC | PRN
  Administered 2023-12-05: 160 ug via INTRAVENOUS

## 2023-12-05 MED ORDER — ONDANSETRON HCL 4 MG/2ML IJ SOLN
INTRAMUSCULAR | Status: AC
  Filled 2023-12-05: qty 2

## 2023-12-05 MED ORDER — FENTANYL CITRATE (PF) 250 MCG/5ML IJ SOLN
INTRAMUSCULAR | Status: AC
  Filled 2023-12-05: qty 5

## 2023-12-05 MED ORDER — MIDAZOLAM HCL 2 MG/2ML IJ SOLN
INTRAMUSCULAR | Status: AC
  Filled 2023-12-05: qty 2

## 2023-12-05 MED ORDER — CEFAZOLIN SODIUM-DEXTROSE 2-4 GM/100ML-% IV SOLN: 2.0000 g | INTRAVENOUS | Status: DC

## 2023-12-05 MED ORDER — CHLORHEXIDINE GLUCONATE 4 % EX SOLN
60.0000 mL | Freq: Once | CUTANEOUS | Status: DC
  Administered 2023-12-05: 4 via TOPICAL

## 2023-12-05 MED ORDER — LACTATED RINGERS IV SOLN: INTRAVENOUS | Status: DC

## 2023-12-05 MED ORDER — FENTANYL CITRATE (PF) 100 MCG/2ML IJ SOLN
INTRAMUSCULAR | Status: AC
  Filled 2023-12-05: qty 2

## 2023-12-05 MED ORDER — ROCURONIUM BROMIDE 10 MG/ML (PF) SYRINGE
PREFILLED_SYRINGE | INTRAVENOUS | Status: AC
  Filled 2023-12-05: qty 10

## 2023-12-05 SURGICAL SUPPLY — 38 items
BAG COUNTER SPONGE SURGICOUNT (BAG) ×1 IMPLANT
BAG DECANTER FOR FLEXI CONT (MISCELLANEOUS) ×1 IMPLANT
BNDG ELASTIC 3INX 5YD STR LF (GAUZE/BANDAGES/DRESSINGS) IMPLANT
BNDG ELASTIC 4X5.8 VLCR STR LF (GAUZE/BANDAGES/DRESSINGS) IMPLANT
BNDG GAUZE DERMACEA FLUFF 4 (GAUZE/BANDAGES/DRESSINGS) IMPLANT
CORD BIPOLAR FORCEPS 12FT (ELECTRODE) IMPLANT
CUFF TOURN SGL QUICK 18X4 (TOURNIQUET CUFF) IMPLANT
DRAPE SURG 17X23 STRL (DRAPES) ×1 IMPLANT
ELECTRODE REM PT RTRN 9FT ADLT (ELECTROSURGICAL) IMPLANT
GAUZE PACKING IODOFORM 1/4X15 (PACKING) IMPLANT
GAUZE SPONGE 4X4 12PLY STRL (GAUZE/BANDAGES/DRESSINGS) ×1 IMPLANT
GAUZE XEROFORM 1X8 LF (GAUZE/BANDAGES/DRESSINGS) ×1 IMPLANT
GLOVE BIOGEL M 8.0 STRL (GLOVE) ×1 IMPLANT
GOWN STRL REUS W/ TWL LRG LVL3 (GOWN DISPOSABLE) ×2 IMPLANT
IV NS IRRIG 3000ML ARTHROMATIC (IV SOLUTION) IMPLANT
KIT BASIN OR (CUSTOM PROCEDURE TRAY) ×1 IMPLANT
KIT TURNOVER KIT B (KITS) ×1 IMPLANT
LAVAGE JET IRRISEPT WOUND (IRRIGATION / IRRIGATOR) IMPLANT
MANIFOLD NEPTUNE II (INSTRUMENTS) ×1 IMPLANT
NDL HYPO 25GX1X1/2 BEV (NEEDLE) IMPLANT
NEEDLE HYPO 25GX1X1/2 BEV (NEEDLE) IMPLANT
NS IRRIG 1000ML POUR BTL (IV SOLUTION) ×1 IMPLANT
PACK ORTHO EXTREMITY (CUSTOM PROCEDURE TRAY) ×1 IMPLANT
PAD ARMBOARD POSITIONER FOAM (MISCELLANEOUS) ×2 IMPLANT
PAD CAST 4YDX4 CTTN HI CHSV (CAST SUPPLIES) IMPLANT
PENCIL BUTTON HOLSTER BLD 10FT (ELECTRODE) IMPLANT
SET CYSTO W/LG BORE CLAMP LF (SET/KITS/TRAYS/PACK) IMPLANT
SET HNDPC FAN SPRY TIP SCT (DISPOSABLE) IMPLANT
SOAP 2 % CHG 4 OZ (WOUND CARE) ×1 IMPLANT
SPONGE T-LAP 18X18 ~~LOC~~+RFID (SPONGE) IMPLANT
SPONGE T-LAP 4X18 ~~LOC~~+RFID (SPONGE) ×1 IMPLANT
SWAB CULTURE ESWAB REG 1ML (MISCELLANEOUS) IMPLANT
SYR CONTROL 10ML LL (SYRINGE) IMPLANT
TOWEL GREEN STERILE (TOWEL DISPOSABLE) ×1 IMPLANT
TOWEL GREEN STERILE FF (TOWEL DISPOSABLE) ×1 IMPLANT
TUBE CONNECTING 12X1/4 (SUCTIONS) ×1 IMPLANT
WATER STERILE IRR 1000ML POUR (IV SOLUTION) ×1 IMPLANT
YANKAUER SUCT BULB TIP NO VENT (SUCTIONS) ×1 IMPLANT

## 2023-12-05 NOTE — Progress Notes (Signed)
 RN went to check on the patient after receiving report from the RN who previously had her.Patient found seated at the edge of the bed and she was very drowsy.She could barely hold a 30 seconds conversation before dosing off.She was assisted back to bed and was left comfortable in a deep sleep.

## 2023-12-05 NOTE — Consult Note (Signed)
 Reason for Consult:Infection LUE Referring Physician: Hospitalist  CC:It started in my upper arm and leg and now is in my hand  HPI:  Kelly [name] is an 38 y.o. right handed female who was admitted a couple days ago with LUE, LLE pain, erythema.  She was taken to OR 7/22 for I&D of LLE for Necrotizing Fascitis.  On f/u exam, left upper arm better, but still has significant swelling, erythema, blister formation of L hand and forearm.  I was contacted as it was felt that urgent I&D was needed. Pain is rated at   7 /10 and is described as sharp mostly in dorsal hand and some in forearm.  Pain is constant.  Pain is made better by rest/immobilization, worse with motion.   Associated signs/symptoms: h/o IVDA, multiple infections Previous treatment:  I&D LLE 7/22;  some h/o surgery L dorsal hand ? infection  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 12/05/2017   Abscess of breast    rt breast   Abscess of right breast 07/13/2014   Acute cystitis without hematuria    ADD (attention deficit disorder)    Back pain    Bacteremia due to Enterococcus 06/15/2018   Bipolar 1 disorder (HCC)    Endocarditis 2020   Foreign body granuloma of soft tissue, NEC, right upper arm    History of hepatitis C 10/01/2018   Hypertension    Infection of left wrist (HCC) 10/26/2020   Influenza B 06/17/2018   Leukocytosis    Multifocal pneumonia 06/14/2018   Polysubstance abuse (HCC)    Pyelonephritis affecting pregnancy 05/24/2016   Sepsis (HCC) 12/02/2017   Suicidal overdose (HCC)    Thigh abscess 10/26/2020   Vaginal discharge 10/26/2020    Past Surgical History:  Procedure Laterality Date   BACK SURGERY     scoliosis-in care everywhere 08/1999   BUBBLE STUDY  11/01/2020   Procedure: BUBBLE STUDY;  Surgeon: Kate Lonni CROME, MD;  Location: [facility] ENDOSCOPY;  Service: Cardiovascular;;   CESAREAN SECTION     CHOLECYSTECTOMY  March 2015   FASCIOTOMY Left 12/03/2023    Procedure: FASCIOTOMY, INCISION AND DRAINAGE NECROTIZING FASCITIS;  Surgeon: Kendal Franky SQUIBB, MD;  Location: [facility] OR;  Service: Orthopedics;  Laterality: Left;   FOREIGN BODY REMOVAL Right 05/28/2016   Procedure: REMOVAL FOREIGN BODY RIGHT ARM;  Surgeon: Addie Glendia Hacker, MD;  Location: [facility] OR;  Service: Orthopedics;  Laterality: Right;   I & D EXTREMITY Left 10/26/2020   Procedure: IRRIGATION AND DEBRIDEMENT LEFT WRIST;  Surgeon: Shari Easter, MD;  Location: [name] OR;  Service: Orthopedics;  Laterality: Left;   INCISION AND DRAINAGE ABSCESS Right 11/01/2020   Procedure: INCISION AND DRAINAGE ABSCESS RIGHT THIGH X 2;  Surgeon: Teresa Lonni HERO, MD;  Location: [facility] OR;  Service: General;  Laterality: Right;   IRRIGATION AND DEBRIDEMENT ABSCESS Right 07/13/2014   Procedure: IRRIGATION AND DEBRIDEMEN TRIGHT BREAST ABSCESS;  Surgeon: Donnice Lima, MD;  Location: [facility] OR;  Service: General;  Laterality: Right;   LUMBAR LAMINECTOMY/DECOMPRESSION MICRODISCECTOMY N/A 12/05/2017   Procedure: LUMBAR LAMINECTOMY FOR EPIDURAL ABSCESS;  Surgeon: Mavis Purchase, MD;  Location: [facility] OR;  Service: Neurosurgery;  Laterality: N/A;   MINOR IRRIGATION AND DEBRIDEMENT OF WOUND Left 10/26/2020   Procedure: MINOR IRRIGATION AND DEBRIDEMENT OF LEFT BREAST AND BILATERAL THIGHS;  Surgeon: Shari Easter, MD;  Location: [facility] OR;  Service: Orthopedics;  Laterality: Left;   MULTIPLE EXTRACTIONS WITH ALVEOLOPLASTY Bilateral 07/03/2018  Procedure: Extraction of tooth #'s 30 and 31 with alveoloplasty;  Surgeon: Cyndee Tanda FALCON, DDS;  Location: [facility] OR;  Service: Oral Surgery;  Laterality: Bilateral;   RADIOLOGY WITH ANESTHESIA N/A 12/05/2017   Procedure: MRI WITH ANESTHESIA OF [facility] AND LUMBAR SPINE WITH AND WITHOUT;  Surgeon: Radiologist, Medication, MD;  Location: [facility] OR;  Service: Radiology;  Laterality: N/A;   RIGHT/LEFT HEART CATH AND CORONARY ANGIOGRAPHY N/A 05/21/2019   Procedure: RIGHT/LEFT HEART CATH AND CORONARY ANGIOGRAPHY;   Surgeon: Cherrie Toribio SAUNDERS, MD;  Location: [facility] INVASIVE CV LAB;  Service: Cardiovascular;  Laterality: N/A;   TEE WITHOUT CARDIOVERSION N/A 05/21/2019   Procedure: TRANSESOPHAGEAL ECHOCARDIOGRAM (TEE);  Surgeon: Cherrie Toribio SAUNDERS, MD;  Location: [name] ENDOSCOPY;  Service: Cardiovascular;  Laterality: N/A;   TEE WITHOUT CARDIOVERSION N/A 11/01/2020   Procedure: TRANSESOPHAGEAL ECHOCARDIOGRAM (TEE);  Surgeon: Kate Lonni CROME, MD;  Location: [facility] ENDOSCOPY;  Service: Cardiovascular;  Laterality: N/A;   TUBAL LIGATION Bilateral 09/30/2020   Procedure: POST PARTUM TUBAL LIGATION;  Surgeon: Herchel Gloris LABOR, MD;  Location: [facility] LD ORS;  Service: Gynecology;  Laterality: Bilateral;    Family History  Problem Relation Age of Onset   Diabetes Mother    Diabetes Father    Hypertension Father    Heart attack Neg Hx    Hyperlipidemia Neg Hx    Sudden death Neg Hx     Social History:  reports that she quit smoking about 5 years ago. Her smoking use included cigarettes. She started smoking about 18 years ago. She has a 13 pack-year smoking history. She has never used smokeless tobacco. She reports that she does not currently use alcohol. She reports current drug use. Drugs: Cocaine, Heroin, IV, and Fentanyl .  Allergies:  Allergies  Allergen Reactions   Hydrocodone  Itching   Morphine  And Codeine Nausea And Vomiting    Medications: I have reviewed the patient's current medications.  Results for orders placed or performed during the hospital encounter of 12/03/23 (from the past 48 hours)  Basic metabolic panel with GFR     Status: Abnormal   Collection Time: 12/04/23  8:36 AM  Result Value Ref Range   Sodium 130 (L) 135 - 145 mmol/L   Potassium 3.3 (L) 3.5 - 5.1 mmol/L   Chloride 98 98 - 111 mmol/L   CO2 21 (L) 22 - 32 mmol/L   Glucose, Bld 90 70 - 99 mg/dL    Comment: Glucose reference range applies only to samples taken after fasting for at least 8 hours.   BUN 22 (H) 6 - 20 mg/dL    Creatinine, Ser 9.08 0.44 - 1.00 mg/dL   Calcium  8.2 (L) 8.9 - 10.3 mg/dL   GFR, Estimated >39 >39 mL/min    Comment: (NOTE) Calculated using the CKD-EPI Creatinine Equation (2021)    Anion gap 11 5 - 15    Comment: Performed at [facility], [address], [place], [place] [address]  CBC     Status: Abnormal   Collection Time: 12/04/23  8:36 AM  Result Value Ref Range   WBC 18.2 (H) 4.0 - 10.5 K/uL   RBC 3.42 (L) 3.87 - 5.11 MIL/uL   Hemoglobin 9.3 (L) 12.0 - 15.0 g/dL   HCT 71.6 (L) 63.9 - 53.9 %   MCV 82.7 80.0 - 100.0 fL   MCH 27.2 26.0 - 34.0 pg   MCHC 32.9 30.0 - 36.0 g/dL   RDW 85.1 88.4 - 84.4 %   Platelets 191 150 - 400  K/uL   nRBC 0.0 0.0 - 0.2 %    Comment: Performed at [facility], [address] [place], [place] [address]  Phosphorus     Status: None   Collection Time: 12/04/23  8:36 AM  Result Value Ref Range   Phosphorus 3.4 2.5 - 4.6 mg/dL    Comment: Performed at [facility], [address], [place], [place] [address]  Magnesium      Status: None   Collection Time: 12/04/23  8:36 AM  Result Value Ref Range   Magnesium  1.7 1.7 - 2.4 mg/dL    Comment: Performed at [name], [address] [place], [place] [address]  Rapid urine drug screen (hospital performed)     Status: Abnormal   Collection Time: 12/05/23  1:03 AM  Result Value Ref Range   Opiates POSITIVE (A) NONE DETECTED   Cocaine POSITIVE (A) NONE DETECTED   Benzodiazepines POSITIVE (A) NONE DETECTED   Amphetamines POSITIVE (A) NONE DETECTED    Comment: (NOTE) Trazodone  is metabolized in vivo to several metabolites, including pharmacologically active m-CPP, which is excreted in the urine. Immunoassay screens for amphetamines and MDMA have potential cross-reactivity with these compounds and may provide false positive  results.     Tetrahydrocannabinol NONE DETECTED NONE DETECTED   Barbiturates NONE DETECTED NONE DETECTED    Comment: (NOTE) DRUG SCREEN FOR MEDICAL  PURPOSES ONLY.  IF CONFIRMATION IS NEEDED FOR ANY PURPOSE, NOTIFY LAB WITHIN 5 DAYS.  LOWEST DETECTABLE LIMITS FOR URINE DRUG SCREEN Drug Class                     Cutoff (ng/mL) Amphetamine and metabolites    1000 Barbiturate and metabolites    200 Benzodiazepine                 200 Opiates and metabolites        300 Cocaine and metabolites        300 THC                            50 Performed at [facility], [address], [place], [place] [address]   Basic metabolic panel with GFR     Status: Abnormal   Collection Time: 12/05/23  6:04 AM  Result Value Ref Range   Sodium 133 (L) 135 - 145 mmol/L   Potassium 3.9 3.5 - 5.1 mmol/L   Chloride 99 98 - 111 mmol/L   CO2 22 22 - 32 mmol/L   Glucose, Bld 77 70 - 99 mg/dL    Comment: Glucose reference range applies only to samples taken after fasting for at least 8 hours.   BUN 18 6 - 20 mg/dL   Creatinine, Ser 9.20 0.44 - 1.00 mg/dL   Calcium  8.2 (L) 8.9 - 10.3 mg/dL   GFR, Estimated >39 >39 mL/min    Comment: (NOTE) Calculated using the CKD-EPI Creatinine Equation (2021)    Anion gap 12 5 - 15    Comment: Performed at [facility], [address], [place], [place] [address]  CBC     Status: Abnormal   Collection Time: 12/05/23  6:04 AM  Result Value Ref Range   WBC 18.0 (H) 4.0 - 10.5 K/uL   RBC 3.14 (L) 3.87 - 5.11 MIL/uL   Hemoglobin 8.3 (L) 12.0 - 15.0 g/dL   HCT 73.5 (L) 63.9 - 53.9 %  MCV 84.1 80.0 - 100.0 fL   MCH 26.4 26.0 - 34.0 pg   MCHC 31.4 30.0 - 36.0 g/dL   RDW 84.7 88.4 - 84.4 %   Platelets 209 150 - 400 K/uL   nRBC 0.0 0.0 - 0.2 %    Comment: Performed at [facility], [address], [place], [place] [address]  Magnesium      Status: Abnormal   Collection Time: 12/05/23  6:04 AM  Result Value Ref Range   Magnesium  1.5 (L) 1.7 - 2.4 mg/dL    Comment: Performed at [facility], [address], [place], [place] [address]    ECHOCARDIOGRAM COMPLETE Result Date: 12/04/2023     ECHOCARDIOGRAM REPORT   Patient Name:   Kelly [name] Date of Exam: 12/04/2023 Medical Rec #:  [id number]          Height:       67.0 in Accession #:    [id number]         Weight:       176.4 lb Date of Birth:  [birth year]           BSA:          1.917 m Patient Age:    38 years           BP:           110/62 mmHg Patient Gender: F                  HR:           106 bpm. Exam Location:  Inpatient Procedure: 2D Echo, Cardiac Doppler and Color Doppler (Both Spectral and Color            Flow Doppler were utilized during procedure). Indications:    Other cardiac sounds R01.2  History:        Patient has prior history of Echocardiogram examinations, most                 recent 01/19/2021. CHF, Arrythmias:Tachycardia; Risk                 Factors:Polysubstance Abuse, Former Smoker and Hypertension.  Sonographer:    Koleen Popper RDCS Referring Phys: [id number] [facility]  Sonographer Comments: Image acquisition challenging due to respiratory motion. IMPRESSIONS  1. Left ventricular ejection fraction, by estimation, is 70 to 75%. The left ventricle has hyperdynamic function. The left ventricle has no regional wall motion abnormalities. Left ventricular diastolic parameters were normal. There is the interventricular septum is flattened in diastole ('D' shaped left ventricle), consistent with right ventricular volume overload and the interventricular septum is flattened in systole, consistent with right ventricular pressure overload.  2. Right ventricular systolic function is mildly reduced. The right ventricular size is severely enlarged. There is severely elevated pulmonary artery systolic pressure.  3. Right atrial size was severely dilated.  4. The mitral valve is normal in structure. No evidence of mitral valve regurgitation. No evidence of mitral stenosis.  5. TV annulus severely dilated with malcoaptation of the leaflets due to known history of TV endocarditis. The tricuspid valve is abnormal. Tricuspid valve  regurgitation is massive.  6. The aortic valve is normal in structure. Aortic valve regurgitation is not visualized. No aortic stenosis is present.  7. The inferior vena cava is normal in size with greater than 50% respiratory variability, suggesting right atrial pressure of 3 mmHg. FINDINGS  Left Ventricle: Left ventricular ejection fraction, by estimation, is 70  to 75%. The left ventricle has hyperdynamic function. The left ventricle has no regional wall motion abnormalities. The left ventricular internal cavity size was normal in size. There is no left ventricular hypertrophy. The interventricular septum is flattened in diastole ('D' shaped left ventricle), consistent with right ventricular volume overload and the interventricular septum is flattened in systole, consistent with right ventricular pressure overload. Left ventricular diastolic parameters were normal. Right Ventricle: The right ventricular size is severely enlarged. No increase in right ventricular wall thickness. Right ventricular systolic function is mildly reduced. There is severely elevated pulmonary artery systolic pressure. The tricuspid regurgitant velocity is 4.24 m/s, and with an assumed right atrial pressure of 15 mmHg, the estimated right ventricular systolic pressure is 86.9 mmHg. Left Atrium: Left atrial size was normal in size. Right Atrium: Right atrial size was severely dilated. Pericardium: There is no evidence of pericardial effusion. Mitral Valve: The mitral valve is normal in structure. No evidence of mitral valve regurgitation. No evidence of mitral valve stenosis. Tricuspid Valve: TV annulus severely dilated with malcoaptation of the leaflets due to known history of TV endocarditis. The tricuspid valve is abnormal. Tricuspid valve regurgitation is massive. No evidence of tricuspid stenosis. Aortic Valve: The aortic valve is normal in structure. Aortic valve regurgitation is not visualized. No aortic stenosis is present. Pulmonic  Valve: The pulmonic valve was normal in structure. Pulmonic valve regurgitation is not visualized. No evidence of pulmonic stenosis. Aorta: The aortic root is normal in size and structure. Venous: The inferior vena cava is normal in size with greater than 50% respiratory variability, suggesting right atrial pressure of 3 mmHg. IAS/Shunts: The interatrial septum was not well visualized.  LEFT VENTRICLE PLAX 2D LVIDd:         4.70 cm   Diastology LVIDs:         3.00 cm   LV e' medial:    10.40 cm/s LV PW:         0.90 cm   LV E/e' medial:  9.0 LV IVS:        0.90 cm   LV e' lateral:   27.00 cm/s LVOT diam:     2.10 cm   LV E/e' lateral: 3.5 LV SV:         70 LV SV Index:   37 LVOT Area:     3.46 cm  RIGHT VENTRICLE             IVC RV Basal diam:  5.60 cm     IVC diam: 2.30 cm RV Mid diam:    5.30 cm RV S prime:     36.40 cm/s TAPSE (M-mode): 3.6 cm LEFT ATRIUM             Index        RIGHT ATRIUM           Index LA diam:        3.80 cm 1.98 cm/m   RA Area:     45.10 cm LA Vol (A2C):   37.9 ml 19.77 ml/m  RA Volume:   219.00 ml 114.24 ml/m LA Vol (A4C):   35.1 ml 18.31 ml/m LA Biplane Vol: 37.8 ml 19.72 ml/m  AORTIC VALVE LVOT Vmax:   142.00 cm/s LVOT Vmean:  89.900 cm/s LVOT VTI:    0.203 m  AORTA Ao Root diam: 3.10 cm Ao Asc diam:  2.90 cm MITRAL VALVE               TRICUSPID VALVE  MV Area (PHT): 3.99 cm    TR Peak grad:   71.9 mmHg MV Decel Time: 190 msec    TR Vmax:        424.00 cm/s MV E velocity: 93.80 cm/s MV A velocity: 76.00 cm/s  SHUNTS MV E/A ratio:  1.23        Systemic VTI:  0.20 m                            Systemic Diam: 2.10 cm Morene Brownie Electronically signed by Morene Brownie Signature Date/Time: 12/04/2023/12:58:44 PM    Final     Pertinent items are noted in HPI. Temp:  [97.6 F (36.4 C)-97.9 F (36.6 C)] 97.6 F (36.4 C) (07/24 1458) Pulse Rate:  [52-93] 88 (07/24 1458) Resp:  [15-17] 15 (07/24 1458) BP: (97-130)/(60-83) 130/70 (07/24 1458) SpO2:  [89 %-98 %] 97 % (07/24  1458) General appearance: alert and cooperative Resp: clear to auscultation bilaterally Cardio: regular rate and rhythm LUE: diffuse swelling mostly of hand, but extending up forearm and to upper arm above elbow.  Erythema of dorsal hand, resolving erythema more proximal; blister type formation of forearm RUE: no similar involvement as left   Assessment: Infection LUE Plan: Will I&D. I have discussed this treatment plan in detail with patient, including the risks of the recommended treatment and surgery, the benefits and the alternatives.  The patient understands that additional treatment may be necessary.  Loki Wuthrich JAYSON Rogue 12/05/2023, 3:16 PM

## 2023-12-05 NOTE — Consult Note (Signed)
 [facility]    Date of Admission:  12/03/2023     Total days of antibiotics 4               Reason for Consult: Necrotizing fasciitis    Referring Provider: Dr. Mennie Lamy Primary Care Provider: Pcp, No   ASSESSMENT:  [name] is a 38 y/o caucasian female with history of IVDU and disseminated MRSA infection and tricuspid valve endocarditis presenting with rapidly worsening edema, redness and pain of her left lower and upper extremities and found to have elevated LRINEC score concerning for necrotizing fasciitis s/p lower extremity debridement.   [name] blood cultures are without growth to date and is POD #1 from initial lower extremity debridement with plans for upper extremity debridement today and repeat lower extremity debridement tomorrow. Will request surgical specimens to be obtained to guide antibiotic therapy. Continue broad spectrum coverage with vancomycin  and piperacillin -tazobactam. Therapeutic drug monitoring of renal function and vancomycin  levels per protocol. Monitor blood cultures for bacteremia. Post-operative wound care and additional surgical interventions per Orthopedics. Remaining medical and supportive care per Internal Medicine.   PLAN:  Continue vancomycin  and piperacillin -tazobactam.  Therapeutic drug monitoring of renal function and vancomycin  levels per protocol.  Post-operative care and additional surgical interventions per Orthopedics.  Request surgical cultures to be obtained to guide antibiotic therapy.  Remaining medical and supportive care per Internal Medicine.     Principal Problem:   Cellulitis Active Problems:   Substance abuse (HCC)   AKI (acute kidney injury) (HCC)   Severe sepsis (HCC)   Hypokalemia   Necrotizing fasciitis of lower leg (HCC)    chlorhexidine   60 mL Topical Once   [MAR Hold] Chlorhexidine  Gluconate Cloth  6 each Topical Daily   [MAR Hold] magnesium  oxide  400 mg Oral BID   [MAR  Hold] methadone   20 mg Oral Daily   [MAR Hold] mupirocin  ointment  1 Application Nasal BID   povidone-iodine   2 Application Topical Once     HPI: Kelly [name] is a 38 y.o. female with previous medical history of heart failure, recurrent tricuspid valve endocarditis, MRSA bacteremia, septic emboli, pulmonary embolism, C. Difficile infection, Bipolar disorder, Hepatitis C, and intravenous drug use presenting to the ED with redness and swelling of the left arm and left leg.  [name] is known to the ID service having last been seen by Dr. Overton during hospitalization disseminated MRSA infection and tricuspid valve endocarditis.   [name] began around 12/01/23 after using cocaine and immediately began having rapidly progressing left leg edema, redness and pain. Also had pain and swelling in her left upper extremity. Had concern for the presence of xylazine. Afebrile on arrival with WBC count of 27,600.  Chest x-ray with no cardiopulmonary disease.  CT imaging of the left lower extremity from femur to foot with extensive subcutaneous fluid/edema and no drainable fluid collections or soft tissue gas concerning for cellulitis.  Blood cultures obtained and started on broad-spectrum coverage with vancomycin  piperacillin -tazobactam, and linezolid .  Elevated LRINEC score and concern for necrotizing fasciitis.  Brought to the OR 12/03/2023 for debridement of necrotizing fasciitis and wound VAC placement.  Encountered dishwater fluid along the fascial planes especially distal around the ankle with less involvement of the fascial planes proximally.  No surgical cultures were obtained.  Plans for repeat debridement on 12/06/2023.  Hand surgery planning for debridement today.  Blood cultures without growth to date.    Review of  Systems: Review of Systems  Constitutional:  Negative for chills, fever and weight loss.  Respiratory:  Negative for cough, shortness of breath and wheezing.   Cardiovascular:   Negative for chest pain and leg swelling.  Gastrointestinal:  Negative for abdominal pain, constipation, diarrhea, nausea and vomiting.  Musculoskeletal:        Pain in left upper and lower extremity  Skin:  Negative for rash.     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 12/05/2017   Abscess of breast    rt breast   Abscess of right breast 07/13/2014   Acute cystitis without hematuria    ADD (attention deficit disorder)    Back pain    Bacteremia due to Enterococcus 06/15/2018   Bipolar 1 disorder (HCC)    Endocarditis 2020   Foreign body granuloma of soft tissue, NEC, right upper arm    History of hepatitis C 10/01/2018   Hypertension    Infection of left wrist (HCC) 10/26/2020   Influenza B 06/17/2018   Leukocytosis    Multifocal pneumonia 06/14/2018   Polysubstance abuse (HCC)    Pyelonephritis affecting pregnancy 05/24/2016   Sepsis (HCC) 12/02/2017   Suicidal overdose (HCC)    Thigh abscess 10/26/2020   Vaginal discharge 10/26/2020    Social History   Tobacco Use   Smoking status: Former    Current packs/day: 0.00    Average packs/day: 1 pack/day for 13.0 years (13.0 ttl pk-yrs)    Types: Cigarettes    Start date: 06/13/2005    Quit date: 06/13/2018    Years since quitting: 5.4   Smokeless tobacco: Never  Vaping Use   Vaping status: Never Used  Substance Use Topics   Alcohol use: Not Currently    Comment: one or two drinks weekly   Drug use: Yes    Types: Cocaine, Heroin, IV, Fentanyl     Comment: heroin, crack cocaine, fentanyl  last used today (07/13/20)     Family History  Problem Relation Age of Onset   Diabetes Mother    Diabetes Father    Hypertension Father    Heart attack Neg Hx    Hyperlipidemia Neg Hx    Sudden death Neg Hx     Allergies  Allergen Reactions   Hydrocodone  Itching   Morphine  And Codeine Nausea And Vomiting    OBJECTIVE: Blood pressure 130/70, pulse 88, temperature 97.6 F (36.4 C),  temperature source Oral, resp. rate 15, height 5' 7 (1.702 m), weight 80 kg, last menstrual period 11/28/2023, SpO2 97%.  Physical Exam Constitutional:      General: She is not in acute distress.    Appearance: She is well-developed.  Cardiovascular:     Rate and Rhythm: Normal rate and regular rhythm.     Heart sounds: Normal heart sounds.  Pulmonary:     Effort: Pulmonary effort is normal.     Breath sounds: Normal breath sounds.  Musculoskeletal:     Comments: Left upper extremity with obvious redness and swelling more distally. Left lower extremity wrapped in surgical dressing with wound vac in place.   Skin:    General: Skin is warm and dry.  Neurological:     Mental Status: She is alert and oriented to person, place, and time.     Lab Results Lab Results  Component Value Date   WBC 18.0 (H) 12/05/2023   HGB 8.3 (L) 12/05/2023   HCT 26.4 (L) 12/05/2023   MCV 84.1  12/05/2023   PLT 209 12/05/2023    Lab Results  Component Value Date   CREATININE 0.79 12/05/2023   BUN 18 12/05/2023   NA 133 (L) 12/05/2023   K 3.9 12/05/2023   CL 99 12/05/2023   CO2 22 12/05/2023    Lab Results  Component Value Date   ALT 10 12/03/2023   AST 29 12/03/2023   ALKPHOS 121 12/03/2023   BILITOT 1.0 12/03/2023     Microbiology: Recent Results (from the past 240 hours)  Blood Culture (routine x 2)     Status: None (Preliminary result)   Collection Time: 12/03/23  2:56 AM   Specimen: BLOOD  Result Value Ref Range Status   Specimen Description BLOOD BLOOD LEFT ARM  Final   Special Requests   Final    BOTTLES DRAWN AEROBIC ONLY Blood Culture results may not be optimal due to an inadequate volume of blood received in culture bottles   Culture   Final    NO GROWTH 2 DAYS Performed at [facility], [address], [place], [place] [address]    Report Status PENDING  Incomplete  Blood Culture (routine x 2)     Status: None (Preliminary result)   Collection Time: 12/03/23  2:58  AM   Specimen: BLOOD  Result Value Ref Range Status   Specimen Description BLOOD BLOOD RIGHT ARM  Final   Special Requests   Final    BOTTLES DRAWN AEROBIC AND ANAEROBIC Blood Culture adequate volume   Culture   Final    NO GROWTH 2 DAYS Performed at [facility], [address], [place], [place] [address]    Report Status PENDING  Incomplete  Resp panel by RT-PCR (RSV, Flu A&B, Covid) Anterior Nasal Swab     Status: None   Collection Time: 12/03/23  3:50 AM   Specimen: Anterior Nasal Swab  Result Value Ref Range Status   SARS Coronavirus 2 by RT PCR NEGATIVE NEGATIVE Final   Influenza A by PCR NEGATIVE NEGATIVE Final   Influenza B by PCR NEGATIVE NEGATIVE Final    Comment: (NOTE) The Xpert Xpress SARS-CoV-2/FLU/RSV plus assay is intended as an aid in the diagnosis of influenza from Nasopharyngeal swab specimens and should not be used as a sole basis for treatment. Nasal washings and aspirates are unacceptable for Xpert Xpress SARS-CoV-2/FLU/RSV testing.  Fact Sheet for Patients: BloggerCourse.com  Fact Sheet for Healthcare Providers: SeriousBroker.it  This test is not yet approved or cleared by the [place]  FDA and has been authorized for detection and/or diagnosis of SARS-CoV-2 by FDA under an Emergency Use Authorization (EUA). This EUA will remain in effect (meaning this test can be used) for the duration of the COVID-19 declaration under Section 564(b)(1) of the Act, 21 U.S.C. section 360bbb-3(b)(1), unless the authorization is terminated or revoked.     Resp Syncytial Virus by PCR NEGATIVE NEGATIVE Final    Comment: (NOTE) Fact Sheet for Patients: BloggerCourse.com  Fact Sheet for Healthcare Providers: SeriousBroker.it  This test is not yet approved or cleared by the [place]  FDA and has been authorized for detection and/or diagnosis of SARS-CoV-2  by FDA under an Emergency Use Authorization (EUA). This EUA will remain in effect (meaning this test can be used) for the duration of the COVID-19 declaration under Section 564(b)(1) of the Act, 21 U.S.C. section 360bbb-3(b)(1), unless the authorization is terminated or revoked.  Performed at [facility], [address] [place], [place] [address]  Surgical pcr screen     Status: Abnormal   Collection Time: 12/03/23  6:44 AM   Specimen: Nasal Mucosa; Nasal Swab  Result Value Ref Range Status   MRSA, PCR POSITIVE (A) NEGATIVE Final    Comment: RESULT CALLED TO, READ BACK BY AND VERIFIED WITH: RN HEATHER BOWMAN [phone] [id number] BY J RAZZAK, MT    Staphylococcus aureus POSITIVE (A) NEGATIVE Final    Comment: RESULT CALLED TO, READ BACK BY AND VERIFIED WITH: RN POWELL SINK [id number] [phone] BY J RAZZAK, MT (NOTE) The Xpert SA Assay (FDA approved for NASAL specimens in patients 15 years of age and older), is one component of a comprehensive surveillance program. It is not intended to diagnose infection nor to guide or monitor treatment. Performed at [facility], [address], [place], [place] [address]    I have personally spent 32 minutes involved in face-to-face and non-face-to-face activities for this patient on the day of the visit. Professional time spent includes the following activities: preparing to see the patient (review of tests), obtaining and reviewing separately obtained history (admission/discharge record), performing a medically appropriate examination, ordering medications, communicating with other health care professionals, documenting clinical information in the EMR, communicating results and counseling patient and family regarding medication and plan of care, and care coordination.    Greg Salome Hautala, NP [facility]  12/05/2023  3:18 PM

## 2023-12-05 NOTE — H&P (View-Only) (Signed)
 Patient ID: Kelly [name], female   DOB: 15-Oct-1985, 38 y.o.   MRN: [id number] Patient is seen in follow-up for necrotizing fasciitis left leg status post initial debridement.  Patient still has significant serosanguineous drainage from the wound VAC.  Examination of the left upper extremity the arm above the elbow shows decreased redness decreased swelling.  Examination of the forearm and hand shows increased swelling and blistering on the dorsum of the left hand.  Instructed patient to be n.p.o. today and will arrange for irrigation and debridement of the left hand and forearm today.  I will plan to take the patient back to surgery on Friday and I will repeat the debridement of the hand and forearm on the left and will repeat debridement of the left leg.  Will obtain tissue cultures.  White cell count is elevated 18,000.

## 2023-12-05 NOTE — Progress Notes (Signed)
     Patient Name: Kelly [name]           DOB: 1986/01/22  MRN: [id number]      Admission Date: 12/03/2023  Attending Provider: Christobal Guadalajara, MD  Primary Diagnosis: Cellulitis   Level of care: Med-Surg   OVERNIGHT PROGRESS REPORT   Notified of pt having suspicious behavior when questioned about mysterious substance at bedside.  Security was called to bedside to confiscate substance. Belongings were searched.  Urine tox screen and sitter ordered.     Damen Windsor, DNP, ACNPC- AG [facility]

## 2023-12-05 NOTE — Anesthesia Preprocedure Evaluation (Signed)
 Anesthesia Evaluation  Patient identified by MRN, date of birth, ID band Patient awake    Reviewed: Allergy & Precautions, NPO status , Patient's Chart, lab work & pertinent test results  Airway Mallampati: II  TM Distance: >3 FB Neck ROM: Full    Dental  (+) Dental Advisory Given, Poor Dentition   Pulmonary neg pulmonary ROS, former smoker   Pulmonary exam normal breath sounds clear to auscultation       Cardiovascular hypertension, pulmonary hypertension+CHF   Rhythm:Regular Rate:Normal + Systolic murmurs 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 Left ventricular ejection fraction, by estimation, is 70 to 75%. The  left ventricle has hyperdynamic function. The left ventricle has no  regional wall motion abnormalities. Left ventricular diastolic parameters  were normal. There is the  interventricular septum is flattened in diastole ('D' shaped left  ventricle), consistent with right ventricular volume overload and the  interventricular septum is flattened in systole, consistent with right  ventricular pressure overload.   2. Right ventricular systolic function is mildly reduced. The right  ventricular size is severely enlarged. There is severely elevated  pulmonary artery systolic pressure.   3. Right atrial size was severely dilated.   4. The mitral valve is normal in structure. No evidence of mitral valve  regurgitation. No evidence of mitral stenosis.   5. TV annulus severely dilated with malcoaptation of the leaflets due to  known history of TV endocarditis. The tricuspid valve is abnormal.  Tricuspid valve regurgitation is massive.   6. The aortic valve is normal in structure. Aortic valve regurgitation is  not visualized. No aortic stenosis is present.   7. The inferior vena cava is normal in size with greater than 50%  respiratory variability, suggesting right atrial pressure of 3  mmHg.      Neuro/Psych  PSYCHIATRIC DISORDERS Anxiety Depression Bipolar Disorder   negative neurological ROS     GI/Hepatic negative GI ROS,,,(+)     substance abuse  , Hepatitis -, C  Endo/Other  negative endocrine ROS    Renal/GU Renal diseaseAKI with metabolic acidosis     Musculoskeletal negative musculoskeletal ROS (+)  Left upper arm forearm hand cellulitis Necrotizing fasciitis of left lower extremity with cellulitis   Abdominal   Peds  Hematology  (+) Blood dyscrasia, anemia   Anesthesia Other Findings Day of surgery medications reviewed with the patient.  Reproductive/Obstetrics                              Anesthesia Physical Anesthesia Plan  ASA: 4  Anesthesia Plan: General   Post-op Pain Management:    Induction: Intravenous and Rapid sequence  PONV Risk Score and Plan: 3 and Dexamethasone , Ondansetron  and Midazolam   Airway Management Planned: Oral ETT  Additional Equipment:   Intra-op Plan:   Post-operative Plan: Extubation in OR  Informed Consent: I have reviewed the patients History and Physical, chart, labs and discussed the procedure including the risks, benefits and alternatives for the proposed anesthesia with the patient or authorized representative who has indicated his/her understanding and acceptance.     Dental advisory given  Plan Discussed with: CRNA  Anesthesia Plan Comments:          Anesthesia Quick Evaluation

## 2023-12-05 NOTE — Plan of Care (Signed)
  Problem: Clinical Measurements: Goal: Ability to maintain clinical measurements within normal limits will improve Outcome: Progressing Goal: Diagnostic test results will improve Outcome: Progressing   Problem: Pain Managment: Goal: General experience of comfort will improve and/or be controlled Outcome: Progressing

## 2023-12-05 NOTE — Anesthesia Procedure Notes (Signed)
 Procedure Name: Intubation Date/Time: 12/05/2023 3:29 PM  Performed by: Boyce Shilling, CRNAPre-anesthesia Checklist: Patient identified, Emergency Drugs available, Suction available, Timeout performed and Patient being monitored Patient Re-evaluated:Patient Re-evaluated prior to induction Oxygen Delivery Method: Circle system utilized Preoxygenation: Pre-oxygenation with 100% oxygen Induction Type: IV induction Ventilation: Mask ventilation without difficulty Laryngoscope Size: Mac and 3 Grade View: Grade I Tube type: Oral Tube size: 7.0 mm Number of attempts: 1 Airway Equipment and Method: Stylet Placement Confirmation: ETT inserted through vocal cords under direct vision, positive ETCO2, CO2 detector and breath sounds checked- equal and bilateral Secured at: 22 cm Tube secured with: Tape Dental Injury: Teeth and Oropharynx as per pre-operative assessment

## 2023-12-05 NOTE — Progress Notes (Addendum)
 While the RN approached the patient's room to administer scheduled medications, the patient was observed in a deep sleep, holding a small bag containing an unknown substance resembling small, fragile, light grey rocks-like particles. The patient was aroused with tactile stimulation. Upon awakening, her speech was initially sluggish and delayed before she became alert and oriented.  When questioned about the bag's contents, the patient first denied having anything, then claimed it was eye makeup. She became visibly anxious, verbally aggressive, and defensive, stating it was "just makeup" and attempted to conceal the bag, adding that she could flush it down the toilet. She requested to use the bathroom but refused to allow the RN to hold the bag, insisting on taking it with her.  It was also noted that on the previous night, the patient intermittently requested pain medication but was often found asleep when the RN arrived to administer it.  Due to the patient's extensive history of substance use and for safety concerns, the charge RN, the hospitalist on call, and hospital security were notified. Security responded to assess the situation, confiscated the substance for testing, and assisted in searching the patient's belongings for unauthorized items.  Following the incident, the patient requested a different nurse be assigned to her care. The shift report was given to Jammie Shave, Charity fundraiser.

## 2023-12-05 NOTE — Progress Notes (Signed)
 Patient ID: Kelly [name], female   DOB: 15-Oct-1985, 38 y.o.   MRN: [id number] Patient is seen in follow-up for necrotizing fasciitis left leg status post initial debridement.  Patient still has significant serosanguineous drainage from the wound VAC.  Examination of the left upper extremity the arm above the elbow shows decreased redness decreased swelling.  Examination of the forearm and hand shows increased swelling and blistering on the dorsum of the left hand.  Instructed patient to be n.p.o. today and will arrange for irrigation and debridement of the left hand and forearm today.  I will plan to take the patient back to surgery on Friday and I will repeat the debridement of the hand and forearm on the left and will repeat debridement of the left leg.  Will obtain tissue cultures.  White cell count is elevated 18,000.

## 2023-12-05 NOTE — Progress Notes (Signed)
 PROGRESS NOTE Kelly [name]  FMW:[id number] DOB: November 13, 1985 DOA: 12/03/2023 PCP: Pcp, No  Brief Narrative/Hospital Course: 38 y.o. 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 difficile infection, bipolar disorder, hep C, hypertension, anemia presenting with complaints of rapidly progressive left leg erythema, pain, and swelling x 2 days PTA. In ED found to be septic, CT scan showed concerns of cellulitis of left lower extremity along with necrotizing fasciitis.she was started on broad-spectrum antibiotics and orthopedic consulted who performed excisional debridement on 12/03/22.  Subjective: Patient seen and examined Complains of left arm pain and swelling mostly in the forearm and hand Overnight patient is afebrile BP 90s-120s, oxygen 89 to 98%  Labs showed improvement electrolytes mag slightly low 1.5 WBC remains elevated 18k   Assessment & Plan:   Severe sepsis POA  Necrotizing fasciitis of left lower extremity with cellulitis IVDA CT concerning for necrotizing fasciitis therefore underwent excisional debridement by orthopedic on 7/22. Dr. Harden following and on wound VAC, wound care.Plan for repeat debridement of the left lower extremity on Friday Patient leukocytosis remains, on Zosyn  and Zyvox , will consult ID Continue pain control with methadone . Blood culture from admission no growth so far.   AKI with metabolic acidosis BUN 46, creatinine 2.3 (previously 0.5 on labs done in October 2022).Renal ultrasound-no hydronephrosis.  Distended bladder. AKI resolved.  Encourage p.o.  Left upper arm forearm hand cellulitis : Currently on IV antibiotics.CT of left upper extremity-diffuse circumferential subcutaneous edema and stranding extending from the level of the elbow through the hand no loculated fluid  collection no soft tissue gas.  Elevate continue to Abx Dr. Harden planning for debridement of the left upper extremity.   Hyponatremia Hypokalemia Hypomagnesemia: improving added Mag-Ox Recent Labs  Lab 12/03/23 0256 12/03/23 1108 12/04/23 0836 12/05/23 0604  K 3.1* 3.0* 3.3* 3.9  CALCIUM  8.5* 8.1* 8.2* 8.2*  MG  --  1.0* 1.7 1.5*  PHOS  --   --  3.4  --    HFmrEF History of severe TR and pulmonary hypertension Last echo done in September 2022 showing EF 40 to 45%.  No signs of volume overload, appears to be compensated.  repeat echocardiogram pending    Ongoing substance abuse/IV drug abuse TOC consulted for counseling.  Patient reports she has been snorting and has not recently used IV drugs.  Reports being on methadone  and asking if she can resume.  Pharmacy consulted slowly starting methadone > uptitrate slowly.    DVT prophylaxis: SCDs Start: 12/03/23 0617 Code Status:   Code Status: Full Code Family Communication: plan of care discussed with patient at bedside. Patient status is: Remains hospitalized because of severity of illness Level of care: Med-Surg   Dispo: The patient is from: home            Anticipated disposition: TBD Objective: Vitals last 24 hrs: Vitals:   12/04/23 1502 12/04/23 2010 12/05/23 0415 12/05/23 0753  BP: 121/80 124/83 99/66 97/60   Pulse: 90 93 (!) 52 86  Resp: 16 16 17 16   Temp: (!) 97.5 F (36.4 C) 97.8 F (36.6 C) 97.7 F (36.5 C) 97.9 F (36.6 C)  TempSrc: Oral Oral Oral Oral  SpO2: 100% 98% (!) 89% 96%  Weight:      Height:        Physical Examination: General exam: alert awake, oriented at baseline, older than stated age HEENT:Oral mucosa moist, Ear/Nose  WNL grossly Respiratory system: Bilaterally clear BS,no use of accessory muscle Cardiovascular system: S1 & S2 +, No JVD. Gastrointestinal system: Abdomen soft,NT,ND, BS+ Nervous System: Alert, awake, moving all extremities,and following commands. Extremities: LUE forearm and  had swollen tender, left lower extremity w/ dressing vac+ Skin: No rashes,no icterus. MSK: Normal muscle bulk,tone, power   Data Reviewed: I have personally reviewed following labs and imaging studies ( see epic result tab) CBC: Recent Labs  Lab 12/03/23 0256 12/03/23 1108 12/04/23 0836 12/05/23 0604  WBC 27.6* 18.5* 18.2* 18.0*  NEUTROABS 27.0*  --   --   --   HGB 10.8* 9.2* 9.3* 8.3*  HCT 33.6* 28.7* 28.3* 26.4*  MCV 84.2 84.2 82.7 84.1  PLT 279 230 191 209   CMP: Recent Labs  Lab 12/03/23 0256 12/03/23 1108 12/04/23 0836 12/05/23 0604  NA 127* 134* 130* 133*  K 3.1* 3.0* 3.3* 3.9  CL 94* 100 98 99  CO2 19* 18* 21* 22  GLUCOSE 111* 79 90 77  BUN 46* 39* 22* 18  CREATININE 2.30* 1.55* 0.91 0.79  CALCIUM  8.5* 8.1* 8.2* 8.2*  MG  --  1.0* 1.7 1.5*  PHOS  --   --  3.4  --    GFR: Estimated Creatinine Clearance: 102.8 mL/min (by C-G formula based on SCr of 0.79 mg/dL). Recent Labs  Lab 12/03/23 0256  AST 29  ALT 10  ALKPHOS 121  BILITOT 1.0  PROT 6.7  ALBUMIN 2.6*   No results for input(s): LIPASE, AMYLASE in the last 168 hours. No results for input(s): AMMONIA in the last 168 hours. Coagulation Profile:  Recent Labs  Lab 12/03/23 0256  INR 1.0   Unresulted Labs (From admission, onward)     Start     Ordered   12/05/23 0103  Rapid urine drug screen (hospital performed)  ONCE - STAT,   STAT        12/05/23 0103   12/04/23 0500  Basic metabolic panel with GFR  Daily,   R      12/03/23 0908   12/04/23 0500  CBC  Daily,   R      12/03/23 0908   12/04/23 0500  Magnesium   Daily,   R      12/03/23 0908           Antimicrobials/Microbiology: Anti-infectives (From admission, onward)    Start     Dose/Rate Route Frequency Ordered Stop   12/03/23 1230  linezolid  (ZYVOX ) IVPB 600 mg        600 mg 300 mL/hr over 60 Minutes Intravenous Every 12 hours 12/03/23 1204     12/03/23 1100  clindamycin  (CLEOCIN ) IVPB 900 mg  Status:  Discontinued        900  mg 100 mL/hr over 30 Minutes Intravenous Every 8 hours 12/03/23 0619 12/03/23 1204   12/03/23 1000  piperacillin -tazobactam (ZOSYN ) IVPB 3.375 g        3.375 g 12.5 mL/hr over 240 Minutes Intravenous Every 8 hours 12/03/23 0652     12/03/23 0652  vancomycin  variable dose per unstable renal function (pharmacist dosing)  Status:  Discontinued         Does not apply See admin instructions 12/03/23 0652 12/03/23 1204   12/03/23 0245  piperacillin -tazobactam (ZOSYN ) IVPB 3.375 g        3.375 g 100 mL/hr over 30 Minutes Intravenous  Once 12/03/23 0237 12/03/23 0338   12/03/23 0245  clindamycin  (CLEOCIN ) IVPB 900 mg  900 mg 100 mL/hr over 30 Minutes Intravenous  Once 12/03/23 0237 12/03/23 0338   12/03/23 0245  vancomycin  (VANCOCIN ) 1,750 mg in sodium chloride  0.9 % 500 mL IVPB  Status:  Discontinued        1,750 mg 258.8 mL/hr over 120 Minutes Intravenous  Once 12/03/23 0241 12/03/23 0242   12/03/23 0245  vancomycin  (VANCOREADY) IVPB 1750 mg/350 mL        1,750 mg 175 mL/hr over 120 Minutes Intravenous Once 12/03/23 0243 12/03/23 0548         Component Value Date/Time   SDES BLOOD BLOOD RIGHT ARM 12/03/2023 0258   SPECREQUEST  12/03/2023 0258    BOTTLES DRAWN AEROBIC AND ANAEROBIC Blood Culture adequate volume   CULT  12/03/2023 0258    NO GROWTH 1 DAY Performed at [facility], [address] [place], [place] [address]    REPTSTATUS PENDING 12/03/2023 9741    Procedures: Procedure(s) (LRB): FASCIOTOMY, INCISION AND DRAINAGE NECROTIZING FASCITIS (Left) Medications reviewed:  Scheduled Meds:  Chlorhexidine  Gluconate Cloth  6 each Topical Daily   magnesium  oxide  400 mg Oral BID   methadone   20 mg Oral Daily   mupirocin  ointment  1 Application Nasal BID   Continuous Infusions:  linezolid  (ZYVOX ) IV 600 mg (12/05/23 0745)   piperacillin -tazobactam (ZOSYN )  IV 3.375 g (12/05/23 0753)    Mennie LAMY, MD [facility] 12/05/2023, 10:38 AM

## 2023-12-05 NOTE — Transfer of Care (Signed)
 Immediate Anesthesia Transfer of Care Note  Patient: Kelly [name]  Procedure(s) Performed: IRRIGATION AND DEBRIDEMENT WOUND (Left)  Patient Location: PACU  Anesthesia Type:General  Level of Consciousness: awake and alert   Airway & Oxygen Therapy: Patient Spontanous Breathing and Patient connected to nasal cannula oxygen  Post-op Assessment: Report given to RN and Post -op Vital signs reviewed and stable  Post vital signs: Reviewed and stable  Last Vitals:  Vitals Value Taken Time  BP 119/80 12/05/23 16:12  Temp    Pulse 105 12/05/23 16:14  Resp 16 12/05/23 16:14  SpO2 92 % 12/05/23 16:14  Vitals shown include unfiled device data.  Last Pain:  Vitals:   12/05/23 1458  TempSrc: Oral  PainSc: 9       Patients Stated Pain Goal: 3 (12/05/23 1458)  Complications: No notable events documented.

## 2023-12-05 NOTE — Op Note (Signed)
 NAME: Kelly [name], Kelly [name] MEDICAL RECORD NO: [id number] ACCOUNT NO: [id number] DATE OF BIRTH: [birth year] FACILITY: [facility] LOCATION: [id number] PHYSICIAN: Jayziah Bankhead C. Lorretta, MD  Operative Report   DATE OF PROCEDURE: 12/05/2023  PREOPERATIVE DIAGNOSES: 1.  History of necrotizing fasciitis of the left lower extremity. 2.  Significant swelling and erythema of the left upper extremity, including the left hand and left forearm. 3.  Suspicion for necrotizing fasciitis of the left hand and left forearm.  POSTOPERATIVE DIAGNOSES: 1.  History of necrotizing fasciitis of the left lower extremity. 2.  Significant swelling and erythema of the left upper extremity, including the left hand and left forearm. 3.  Suspicion for necrotizing fasciitis of the left hand and left forearm.  PROCEDURE PERFORMED: 1.  Extensive incision and drainage of the dorsum of the left hand as well as the entire dorsum of the left forearm. 2.  Limited fasciotomies of the dorsal hand as well as the dorsal forearm.  ANESTHESIA:  General.  ESTIMATED BLOOD LOSS:  50 mL.  COMPLICATIONS:  No acute complications.  SPECIMENS:  None.  INDICATIONS:  The patient is a 38 year old female admitted to the hospital with swelling to her left upper extremity and left lower extremity.  Exam and CAT scan findings suggested necrotizing fasciitis.  She was originally taken on 07/22 for I and D of  the left lower extremity.  A wound VAC was placed. On follow up care, The left upper extremity, including the forearm and hand, appeared worse.  Suspicion was had for the same process of her left hand and forearm.  She was consented for I and D of the  left upper extremity.  DESCRIPTION OF PROCEDURE:  The patient was taken to the operating room and placed supine on the operating room table.  Anesthesia was administered without difficulty.  A timeout was performed.  The patient was on IV antibiotics, so no additional  antibiotics were given.  The left  upper extremity was prepped and draped in the normal sterile fashion.  A tourniquet was used on the upper arm and inflated to 250 mmHg.  Initially, two dorsal hand incisions were made.  There was quite a bit of  subcutaneous edema.  There was no pus.  The tissues appeared edematous; however, appeared viable, including the fascia.  Dorsal hand fasciotomies were performed.  Again, the appearance of the tissue was edematous; however, it did not appear to be  nonviable or grossly infected.  Next, an incision over the entire dorsum and dorsolateral forearm was made.  There was quite a bit of edema in the subcutaneous tissues.  No pus was encountered , Just clear subcutaneous fluid.  Dissection was carried down to  the mobile wad and the fascia was sharply incised, revealing the muscle, which was viable.  Again, no pus was encountered.  Further dissection both anteriorly and posteriorly to this incision did not reveal any loculated collections.  Next, the  tourniquet was released.  Hemostasis was obtained with direct pressure electrocautery.  The wound was irrigated thoroughly with saline solution as well as IrriSept solution.  All wounds were left open, packed with Kerlix, and then sterile dressings and  an Ace wrap were placed.  The patient will be followed closely.  It is my understanding that Dr. Harden will be seeing the patient as soon as tomorrow to reevaluate her leg and her arm.   PUS D: 12/05/2023 4:13:23 pm T: 12/05/2023 7:14:00 pm  JOB: [id number]/ [id number]

## 2023-12-06 ENCOUNTER — Inpatient Hospital Stay (HOSPITAL_COMMUNITY): Payer: MEDICAID

## 2023-12-06 ENCOUNTER — Other Ambulatory Visit: Payer: Self-pay

## 2023-12-06 ENCOUNTER — Encounter (HOSPITAL_COMMUNITY)
Admission: EM | Disposition: A | Discharge status: Home / Self Care | Payer: Self-pay | Source: Home / Self Care | Attending: Internal Medicine

## 2023-12-06 ENCOUNTER — Encounter (HOSPITAL_COMMUNITY): Payer: Self-pay | Admitting: General Surgery

## 2023-12-06 DIAGNOSIS — L03114 Cellulitis of left upper limb: Secondary | ICD-10-CM | POA: Diagnosis not present

## 2023-12-06 DIAGNOSIS — M726 Necrotizing fasciitis: Secondary | ICD-10-CM | POA: Diagnosis not present

## 2023-12-06 DIAGNOSIS — I110 Hypertensive heart disease with heart failure: Secondary | ICD-10-CM | POA: Diagnosis not present

## 2023-12-06 DIAGNOSIS — Z87891 Personal history of nicotine dependence: Secondary | ICD-10-CM

## 2023-12-06 DIAGNOSIS — I5023 Acute on chronic systolic (congestive) heart failure: Secondary | ICD-10-CM

## 2023-12-06 DIAGNOSIS — L02414 Cutaneous abscess of left upper limb: Secondary | ICD-10-CM | POA: Diagnosis not present

## 2023-12-06 HISTORY — PX: IRRIGATION AND DEBRIDEMENT SHOULDER: SHX5880

## 2023-12-06 HISTORY — PX: INCISION AND DRAINAGE OF WOUND: SHX1803

## 2023-12-06 LAB — CBC
HCT: 25.8 % — ABNORMAL LOW (ref 36.0–46.0)
Hemoglobin: 8.3 g/dL — ABNORMAL LOW (ref 12.0–15.0)
MCH: 26.9 pg (ref 26.0–34.0)
MCHC: 32.2 g/dL (ref 30.0–36.0)
MCV: 83.5 fL (ref 80.0–100.0)
Platelets: 272 K/uL (ref 150–400)
RBC: 3.09 MIL/uL — ABNORMAL LOW (ref 3.87–5.11)
RDW: 15.2 % (ref 11.5–15.5)
WBC: 19.3 K/uL — ABNORMAL HIGH (ref 4.0–10.5)
nRBC: 0 % (ref 0.0–0.2)

## 2023-12-06 LAB — HEPATITIS PANEL, ACUTE
HCV Ab: REACTIVE — AB
Hep A IgM: NONREACTIVE
Hep B C IgM: NONREACTIVE
Hepatitis B Surface Ag: NONREACTIVE

## 2023-12-06 LAB — RPR: RPR Ser Ql: NONREACTIVE

## 2023-12-06 LAB — HIV ANTIBODY (ROUTINE TESTING W REFLEX): HIV Screen 4th Generation wRfx: NONREACTIVE

## 2023-12-06 SURGERY — IRRIGATION AND DEBRIDEMENT WOUND
Anesthesia: General | Site: Leg Lower | Laterality: Left

## 2023-12-06 MED ORDER — PROPOFOL 10 MG/ML IV BOLUS
INTRAVENOUS | Status: AC
Start: 2023-12-06 — End: 2023-12-06
  Filled 2023-12-06: qty 20

## 2023-12-06 MED ORDER — HYDROMORPHONE HCL 1 MG/ML IJ SOLN
INTRAMUSCULAR | Status: AC
  Filled 2023-12-06: qty 0.5

## 2023-12-06 MED ORDER — CARMEX CLASSIC LIP BALM EX OINT
1.0000 | TOPICAL_OINTMENT | CUTANEOUS | Status: DC | PRN
  Administered 2023-12-06: 1 via TOPICAL
  Filled 2023-12-06: qty 10

## 2023-12-06 MED ORDER — ROCURONIUM BROMIDE 10 MG/ML (PF) SYRINGE
PREFILLED_SYRINGE | INTRAVENOUS | Status: DC | PRN
  Administered 2023-12-06: 60 mg via INTRAVENOUS

## 2023-12-06 MED ORDER — LIDOCAINE 2% (20 MG/ML) 5 ML SYRINGE
INTRAMUSCULAR | Status: AC
  Filled 2023-12-06: qty 5

## 2023-12-06 MED ORDER — OXYCODONE HCL 5 MG PO TABS
5.0000 mg | ORAL_TABLET | Freq: Once | ORAL | Status: AC | PRN
  Administered 2023-12-06: 5 mg via ORAL

## 2023-12-06 MED ORDER — SUGAMMADEX SODIUM 200 MG/2ML IV SOLN
INTRAVENOUS | Status: DC | PRN
  Administered 2023-12-06: 149.6 mg via INTRAVENOUS

## 2023-12-06 MED ORDER — PHENYLEPHRINE 80 MCG/ML (10ML) SYRINGE FOR IV PUSH (FOR BLOOD PRESSURE SUPPORT)
PREFILLED_SYRINGE | INTRAVENOUS | Status: DC | PRN
  Administered 2023-12-06 (×2): 80 ug via INTRAVENOUS

## 2023-12-06 MED ORDER — OXYCODONE HCL 5 MG PO TABS
ORAL_TABLET | ORAL | Status: AC
  Filled 2023-12-06: qty 1

## 2023-12-06 MED ORDER — VASHE WOUND IRRIGATION OPTIME
TOPICAL | Status: DC | PRN
Start: 2023-12-06 — End: 2023-12-06
  Administered 2023-12-06 (×2): 34 [oz_av]

## 2023-12-06 MED ORDER — SUGAMMADEX SODIUM 200 MG/2ML IV SOLN
INTRAVENOUS | Status: AC
  Filled 2023-12-06: qty 2

## 2023-12-06 MED ORDER — LACTATED RINGERS IV SOLN: INTRAVENOUS | Status: DC | PRN

## 2023-12-06 MED ORDER — DEXAMETHASONE SODIUM PHOSPHATE 10 MG/ML IJ SOLN
INTRAMUSCULAR | Status: AC
Start: 2023-12-06 — End: 2023-12-06
  Filled 2023-12-06: qty 1

## 2023-12-06 MED ORDER — CHLORHEXIDINE GLUCONATE 0.12 % MT SOLN: 15.0000 mL | Freq: Once | OROMUCOSAL | Status: AC

## 2023-12-06 MED ORDER — CHLORHEXIDINE GLUCONATE 0.12 % MT SOLN
OROMUCOSAL | Status: AC
  Administered 2023-12-06: 15 mL via OROMUCOSAL
  Filled 2023-12-06: qty 15

## 2023-12-06 MED ORDER — OXYCODONE HCL 5 MG/5ML PO SOLN: 5.0000 mg | Freq: Once | ORAL | Status: AC | PRN

## 2023-12-06 MED ORDER — ONDANSETRON HCL 4 MG/2ML IJ SOLN
INTRAMUSCULAR | Status: DC | PRN
  Administered 2023-12-06: 4 mg via INTRAVENOUS

## 2023-12-06 MED ORDER — MIDAZOLAM HCL 2 MG/2ML IJ SOLN
INTRAMUSCULAR | Status: DC | PRN
  Administered 2023-12-06: 2 mg via INTRAVENOUS

## 2023-12-06 MED ORDER — FENTANYL CITRATE (PF) 100 MCG/2ML IJ SOLN
INTRAMUSCULAR | Status: AC
  Filled 2023-12-06: qty 2

## 2023-12-06 MED ORDER — ACETAMINOPHEN 10 MG/ML IV SOLN: 1000.0000 mg | Freq: Once | INTRAVENOUS | Status: DC | PRN

## 2023-12-06 MED ORDER — DEXMEDETOMIDINE HCL IN NACL 80 MCG/20ML IV SOLN
INTRAVENOUS | Status: DC | PRN
  Administered 2023-12-06 (×2): 20 ug via INTRAVENOUS

## 2023-12-06 MED ORDER — FENTANYL CITRATE (PF) 250 MCG/5ML IJ SOLN
INTRAMUSCULAR | Status: DC | PRN
  Administered 2023-12-06 (×2): 100 ug via INTRAVENOUS

## 2023-12-06 MED ORDER — ONDANSETRON HCL 4 MG/2ML IJ SOLN
INTRAMUSCULAR | Status: AC
  Filled 2023-12-06: qty 2

## 2023-12-06 MED ORDER — HYDROMORPHONE HCL 1 MG/ML IJ SOLN
INTRAMUSCULAR | Status: AC
  Filled 2023-12-06: qty 1

## 2023-12-06 MED ORDER — FENTANYL CITRATE (PF) 100 MCG/2ML IJ SOLN
INTRAMUSCULAR | Status: AC
Start: 2023-12-06 — End: 2023-12-06
  Filled 2023-12-06: qty 2

## 2023-12-06 MED ORDER — FENTANYL CITRATE (PF) 250 MCG/5ML IJ SOLN
INTRAMUSCULAR | Status: AC
  Filled 2023-12-06: qty 5

## 2023-12-06 MED ORDER — ORAL CARE MOUTH RINSE: 15.0000 mL | Freq: Once | OROMUCOSAL | Status: AC

## 2023-12-06 MED ORDER — OXYCODONE HCL 5 MG PO TABS
10.0000 mg | ORAL_TABLET | ORAL | Status: DC | PRN
  Administered 2023-12-08 – 2023-12-15 (×28): 10 mg via ORAL
  Filled 2023-12-06 (×31): qty 2

## 2023-12-06 MED ORDER — LACTATED RINGERS IV SOLN: INTRAVENOUS | Status: DC

## 2023-12-06 MED ORDER — MIDAZOLAM HCL 2 MG/2ML IJ SOLN
INTRAMUSCULAR | Status: AC
  Filled 2023-12-06: qty 2

## 2023-12-06 MED ORDER — LIDOCAINE 2% (20 MG/ML) 5 ML SYRINGE
INTRAMUSCULAR | Status: DC | PRN
  Administered 2023-12-06: 80 mg via INTRAVENOUS

## 2023-12-06 MED ORDER — FENTANYL CITRATE (PF) 100 MCG/2ML IJ SOLN
25.0000 ug | INTRAMUSCULAR | Status: DC | PRN
  Administered 2023-12-06 (×3): 50 ug via INTRAVENOUS

## 2023-12-06 MED ORDER — ROCURONIUM BROMIDE 10 MG/ML (PF) SYRINGE
PREFILLED_SYRINGE | INTRAVENOUS | Status: AC
  Filled 2023-12-06: qty 10

## 2023-12-06 MED ORDER — VASOPRESSIN 20 UNIT/ML IV SOLN
INTRAVENOUS | Status: AC
  Filled 2023-12-06: qty 1

## 2023-12-06 MED ORDER — PROPOFOL 10 MG/ML IV BOLUS
INTRAVENOUS | Status: DC | PRN
  Administered 2023-12-06 (×3): 20 mg via INTRAVENOUS
  Administered 2023-12-06: 40 mg via INTRAVENOUS

## 2023-12-06 MED ORDER — HYDROMORPHONE HCL 1 MG/ML IJ SOLN
INTRAMUSCULAR | Status: DC | PRN
  Administered 2023-12-06: .5 mg via INTRAVENOUS

## 2023-12-06 MED ORDER — KETOROLAC TROMETHAMINE 15 MG/ML IJ SOLN
15.0000 mg | Freq: Four times a day (QID) | INTRAMUSCULAR | Status: AC
  Administered 2023-12-07 (×3): 15 mg via INTRAVENOUS
  Filled 2023-12-06 (×3): qty 1

## 2023-12-06 SURGICAL SUPPLY — 39 items
BAG COUNTER SPONGE SURGICOUNT (BAG) IMPLANT
BLADE SURG 21 STRL SS (BLADE) ×2 IMPLANT
BNDG COHESIVE 4X5 TAN STRL LF (GAUZE/BANDAGES/DRESSINGS) IMPLANT
BNDG COHESIVE 6X5 TAN NS LF (GAUZE/BANDAGES/DRESSINGS) IMPLANT
BNDG COHESIVE 6X5 TAN ST LF (GAUZE/BANDAGES/DRESSINGS) IMPLANT
BNDG GAUZE DERMACEA FLUFF 4 (GAUZE/BANDAGES/DRESSINGS) IMPLANT
CANISTER WOUNDNEG PRESSURE 500 (CANNISTER) IMPLANT
CLEANSER WND VASHE 34 (WOUND CARE) IMPLANT
CLEANSER WND VASHE INSTL 34OZ (WOUND CARE) IMPLANT
COVER SURGICAL LIGHT HANDLE (MISCELLANEOUS) ×4 IMPLANT
DRAPE DERMATAC (DRAPES) IMPLANT
DRAPE U-SHAPE 47X51 STRL (DRAPES) ×2 IMPLANT
DRESSING PREVENA PLUS CUSTOM (GAUZE/BANDAGES/DRESSINGS) IMPLANT
DRESSING VERAFLO CLEANS CC MED (GAUZE/BANDAGES/DRESSINGS) IMPLANT
DRSG ADAPTIC 3X8 NADH LF (GAUZE/BANDAGES/DRESSINGS) ×2 IMPLANT
DRSG VAC PEEL AND PLACE LRG (GAUZE/BANDAGES/DRESSINGS) IMPLANT
DURAPREP 26ML APPLICATOR (WOUND CARE) ×2 IMPLANT
ELECTRODE REM PT RTRN 9FT ADLT (ELECTROSURGICAL) IMPLANT
GAUZE PAD ABD 8X10 STRL (GAUZE/BANDAGES/DRESSINGS) IMPLANT
GAUZE SPONGE 4X4 12PLY STRL (GAUZE/BANDAGES/DRESSINGS) IMPLANT
GLOVE BIOGEL PI IND STRL 9 (GLOVE) ×2 IMPLANT
GLOVE SURG ORTHO 9.0 STRL STRW (GLOVE) ×2 IMPLANT
GOWN STRL REUS W/ TWL XL LVL3 (GOWN DISPOSABLE) ×4 IMPLANT
GRAFT SKIN WND SURGICLOSE M95 (Tissue) IMPLANT
KIT BASIN OR (CUSTOM PROCEDURE TRAY) ×2 IMPLANT
KIT TURNOVER KIT B (KITS) ×2 IMPLANT
MANIFOLD NEPTUNE II (INSTRUMENTS) ×2 IMPLANT
NS IRRIG 1000ML POUR BTL (IV SOLUTION) ×2 IMPLANT
PACK ORTHO EXTREMITY (CUSTOM PROCEDURE TRAY) ×2 IMPLANT
PAD ARMBOARD POSITIONER FOAM (MISCELLANEOUS) ×4 IMPLANT
PAD NEG PRESSURE SENSATRAC (MISCELLANEOUS) IMPLANT
SET HNDPC FAN SPRY TIP SCT (DISPOSABLE) IMPLANT
STOCKINETTE IMPERVIOUS 9X36 MD (GAUZE/BANDAGES/DRESSINGS) IMPLANT
SUT ETHILON 2 0 PSLX (SUTURE) ×2 IMPLANT
SWAB COLLECTION DEVICE MRSA (MISCELLANEOUS) ×2 IMPLANT
SWAB CULTURE ESWAB REG 1ML (MISCELLANEOUS) IMPLANT
TOWEL GREEN STERILE (TOWEL DISPOSABLE) ×2 IMPLANT
TUBE CONNECTING 12X1/4 (SUCTIONS) ×2 IMPLANT
YANKAUER SUCT BULB TIP NO VENT (SUCTIONS) ×2 IMPLANT

## 2023-12-06 NOTE — Plan of Care (Signed)

## 2023-12-06 NOTE — Anesthesia Postprocedure Evaluation (Signed)
 Anesthesia Post Note  Patient: Kelly [name]  Procedure(s) Performed: IRRIGATION AND DEBRIDEMENT WOUND (Left)     Patient location during evaluation: PACU Anesthesia Type: General Level of consciousness: awake and alert Pain management: pain level controlled Vital Signs Assessment: post-procedure vital signs reviewed and stable Respiratory status: spontaneous breathing, nonlabored ventilation, respiratory function stable and patient connected to nasal cannula oxygen Cardiovascular status: blood pressure returned to baseline and stable Postop Assessment: no apparent nausea or vomiting Anesthetic complications: no   No notable events documented.  Last Vitals:  Vitals:   12/06/23 0336 12/06/23 0801  BP: (!) 133/93 122/88  Pulse: 73 65  Resp: 16 16  Temp: 36.6 C 36.9 C  SpO2: 99% 97%    Last Pain:  Vitals:   12/06/23 0801  TempSrc: Oral  PainSc:                  Adaleigh Warf S

## 2023-12-06 NOTE — Anesthesia Procedure Notes (Signed)
 Procedure Name: Intubation Date/Time: 12/06/2023 3:50 PM  Performed by: Zelphia Norleen HERO, CRNAPre-anesthesia Checklist: Patient identified, Emergency Drugs available, Suction available and Patient being monitored Patient Re-evaluated:Patient Re-evaluated prior to induction Oxygen Delivery Method: Circle system utilized Preoxygenation: Pre-oxygenation with 100% oxygen Induction Type: IV induction Ventilation: Mask ventilation without difficulty Laryngoscope Size: Mac and 3 Grade View: Grade I Tube type: Oral Tube size: 7.0 mm Number of attempts: 1 Airway Equipment and Method: Stylet Placement Confirmation: ETT inserted through vocal cords under direct vision, positive ETCO2 and breath sounds checked- equal and bilateral Secured at: 21 cm Tube secured with: Tape Dental Injury: Teeth and Oropharynx as per pre-operative assessment

## 2023-12-06 NOTE — Anesthesia Preprocedure Evaluation (Signed)
 Anesthesia Evaluation  Patient identified by MRN, date of birth, ID band Patient awake    Reviewed: Allergy & Precautions, NPO status , Patient's Chart, lab work & pertinent test results  History of Anesthesia Complications Negative for: history of anesthetic complications  Airway Mallampati: II  TM Distance: >3 FB Neck ROM: Full    Dental  (+) Dental Advisory Given, Poor Dentition   Pulmonary neg pulmonary ROS, former smoker   Pulmonary exam normal breath sounds clear to auscultation       Cardiovascular hypertension, pulmonary hypertension+CHF   Rhythm:Regular Rate:Normal + Systolic murmurs 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 Left ventricular ejection fraction, by estimation, is 70 to 75%. The  left ventricle has hyperdynamic function. The left ventricle has no  regional wall motion abnormalities. Left ventricular diastolic parameters  were normal. There is the  interventricular septum is flattened in diastole ('D' shaped left  ventricle), consistent with right ventricular volume overload and the  interventricular septum is flattened in systole, consistent with right  ventricular pressure overload.   2. Right ventricular systolic function is mildly reduced. The right  ventricular size is severely enlarged. There is severely elevated  pulmonary artery systolic pressure.   3. Right atrial size was severely dilated.   4. The mitral valve is normal in structure. No evidence of mitral valve  regurgitation. No evidence of mitral stenosis.   5. TV annulus severely dilated with malcoaptation of the leaflets due to  known history of TV endocarditis. The tricuspid valve is abnormal.  Tricuspid valve regurgitation is massive.   6. The aortic valve is normal in structure. Aortic valve regurgitation is  not visualized. No aortic stenosis is present.   7. The inferior vena cava is normal in  size with greater than 50%  respiratory variability, suggesting right atrial pressure of 3 mmHg.      Neuro/Psych  PSYCHIATRIC DISORDERS Anxiety Depression Bipolar Disorder   negative neurological ROS     GI/Hepatic negative GI ROS,,,(+)     substance abuse  , Hepatitis -, C  Endo/Other  negative endocrine ROS    Renal/GU Renal diseaseAKI with metabolic acidosis     Musculoskeletal negative musculoskeletal ROS (+)  Left upper arm forearm hand cellulitis Necrotizing fasciitis of left lower extremity with cellulitis   Abdominal   Peds  Hematology  (+) Blood dyscrasia, anemia   Anesthesia Other Findings Day of surgery medications reviewed with the patient.  Reproductive/Obstetrics                              Anesthesia Physical Anesthesia Plan  ASA: 4  Anesthesia Plan: General   Post-op Pain Management:    Induction: Intravenous  PONV Risk Score and Plan: 3 and Dexamethasone , Ondansetron  and Midazolam   Airway Management Planned: Oral ETT  Additional Equipment:   Intra-op Plan:   Post-operative Plan: Extubation in OR  Informed Consent: I have reviewed the patients History and Physical, chart, labs and discussed the procedure including the risks, benefits and alternatives for the proposed anesthesia with the patient or authorized representative who has indicated his/her understanding and acceptance.     Dental advisory given  Plan Discussed with: CRNA  Anesthesia Plan Comments:          Anesthesia Quick Evaluation

## 2023-12-06 NOTE — Progress Notes (Signed)
 Patient is in the room crying and hollering that she is in severe pain.  She is requesting more pain medication and something for anxiety.  Will contact the on-call MD.  2130 Came in to give patient her Oxy IR 10 mg and patient is now very drowsy and difficult to keep awake. I returned Oxy and did not try to give. Tele - sitter monitor did not notice anything suspicious but not sure what is going on with patient.  Charge nurse did look through patient's handbag to see if she had anything like medications however what was found was a bottle of Aleve , a pack of cigarettes, and 7 bullets (no weapon).  Security was notified and they came and removed the bullets.  Patient was not alert enough to do a count of the Aleve  for Rx. Items were removed from room and placed in a hazard bag and placed in the chart.  Rapid Response has been contacted.  2145 Patient was more alert when [facility] came to assess her however she was still drowsy.  Will continue to monitor.  0451 Patient is much more alert and with it.  She did receive her scheduled Toradol  around 0240 for pain.  9460 Patient is alert and oriented x 4. This nurse, charge nurse and patient did count of Aleve  (11 tabs) and sent to Rx. Patient admitted she took 2-4 tabs of Aleve  last night without informing staff.

## 2023-12-06 NOTE — Progress Notes (Signed)
 PROGRESS NOTE Kelly [name]  FMW:[id number] DOB: 1986-03-01 DOA: 12/03/2023 PCP: Pcp, No  Brief Narrative/Hospital Course: 38 y.o. 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 difficile infection, bipolar disorder, hep C, hypertension, anemia presenting with complaints of rapidly progressive left leg erythema, pain, and swelling x 2 days PTA. In ED found to be septic, CT scan showed concerns of cellulitis of left lower extremity along with necrotizing fasciitis.she was started on broad-spectrum antibiotics and orthopedic consulted who performed excisional debridement on 12/03/22.  Subjective: Patient seen and examined Overnight patient is afebrile vital stable. Labs with WBC trending up 19. On methadone  20 mg daily  Assessment & Plan:   Severe sepsis POA  Necrotizing fasciitis of left lower extremity with cellulitis IVDA CT concerning for necrotizing fasciitis therefore underwent excisional debridement by orthopedic on 7/22. Continue wound VAC, wound care as per Dr. Harden.  Further debridement per orthopedics. Continue pain management with methadone  pharmacy consulted> patient hoping to follow-up with methadone  clinic upon discharge ID input appreciated continue vancomycin , Zosyn  Blood culture from admission no growth so far.  LUE swelling cellulitis including left hand and left forearm : CT of left upper extremity-diffuse circumferential subcutaneous edema and stranding extending from the level of the elbow through the hand no loculated fluid collection no soft tissue gas.  Elevate continue to Abx as above Underwent extensive incision and drainage of the dorsum of the left hand, left forearm and fasciotomies of the hand and forearm 7/24 Dr Lorretta. Cont pain control and dressing per surgery  AKI with  metabolic acidosis BUN 46, creatinine 2.3 (previously 0.5 on labs done in October 2022).Renal ultrasound-no hydronephrosis- distended bladder. AKI resolved. Encourage p.o.   Hyponatremia Hypokalemia Hypomagnesemia: Replaced.  Continue to monitor Recent Labs  Lab 12/03/23 0256 12/03/23 1108 12/04/23 0836 12/05/23 0604  K 3.1* 3.0* 3.3* 3.9  CALCIUM  8.5* 8.1* 8.2* 8.2*  MG  --  1.0* 1.7 1.5*  PHOS  --   --  3.4  --    HFmrEF History of severe TR and pulmonary hypertension Last echo done in September 2022 showing EF 40 to 45%.  No signs of volume overload, appears to be compensated.  repeat echocardiogram > EF 70-75%, dissipate left ventricle with right ventricular pressure overload mildly reduced RV SF severely elevated pulmonary artery systolic pressure severely dilated RA massive tricuspid valve regurgitation with severely dilated TV annulus with known history of TV endocarditis.  Advised to follow-up with outpatient cardiology   Ongoing substance abuse/IV drug abuse TOC consulted for counseling.  Patient reports she has been snorting and has not recently used IV drugs.  Reports being on methadone  and asking if she can resume.  Pharmacy consulted slowly starting methadone > uptitrate slowly.    DVT prophylaxis: SCDs Start: 12/03/23 0617  Code Status:   Code Status: Full Code Family Communication: plan of care discussed with patient at bedside. Patient status is: Remains hospitalized because of severity of illness Level of care: Med-Surg   Dispo: The patient is from: home            Anticipated disposition: TBD Objective: Vitals last 24 hrs: Vitals:   12/05/23 2032 12/06/23 0015 12/06/23 0336 12/06/23 0801  BP: 124/88 (!) 128/93 (!) 133/93 122/88  Pulse: 79 82 73 65  Resp: 16 16 16 16   Temp: 97.6 F (36.4 C) 97.6 F (36.4 C) 97.9 F (36.6 C)  98.4 F (36.9 C)  TempSrc: Oral Oral  Oral  SpO2: 96% 95% 99% 97%  Weight:      Height:        Physical Examination: General  exam: alert awake, oriented at baseline, older than stated age HEENT:Oral mucosa moist, Ear/Nose WNL grossly Respiratory system: Bilaterally clear BS,no use of accessory muscle Cardiovascular system: S1 & S2 +, No JVD. Gastrointestinal system: Abdomen soft,NT,ND, BS+ Nervous System: Alert, awake, moving all extremities,and following commands. Extremities: LUE and forearm hand in ace wrap dressing, LLE in dressing+ Skin: No rashes,no icterus. MSK: Normal muscle bulk,tone, power   Data Reviewed: I have personally reviewed following labs and imaging studies ( see epic result tab) CBC: Recent Labs  Lab 12/03/23 0256 12/03/23 1108 12/04/23 0836 12/05/23 0604 12/06/23 0901  WBC 27.6* 18.5* 18.2* 18.0* 19.3*  NEUTROABS 27.0*  --   --   --   --   HGB 10.8* 9.2* 9.3* 8.3* 8.3*  HCT 33.6* 28.7* 28.3* 26.4* 25.8*  MCV 84.2 84.2 82.7 84.1 83.5  PLT 279 230 191 209 272   CMP: Recent Labs  Lab 12/03/23 0256 12/03/23 1108 12/04/23 0836 12/05/23 0604  NA 127* 134* 130* 133*  K 3.1* 3.0* 3.3* 3.9  CL 94* 100 98 99  CO2 19* 18* 21* 22  GLUCOSE 111* 79 90 77  BUN 46* 39* 22* 18  CREATININE 2.30* 1.55* 0.91 0.79  CALCIUM  8.5* 8.1* 8.2* 8.2*  MG  --  1.0* 1.7 1.5*  PHOS  --   --  3.4  --    GFR: Estimated Creatinine Clearance: 102.8 mL/min (by C-G formula based on SCr of 0.79 mg/dL). Recent Labs  Lab 12/03/23 0256  AST 29  ALT 10  ALKPHOS 121  BILITOT 1.0  PROT 6.7  ALBUMIN 2.6*   No results for input(s): LIPASE, AMYLASE in the last 168 hours. No results for input(s): AMMONIA in the last 168 hours. Coagulation Profile:  Recent Labs  Lab 12/03/23 0256  INR 1.0   Unresulted Labs (From admission, onward)     Start     Ordered   12/06/23 0640  Hepatitis panel, acute  Once,   R       Question:  Specimen collection method  Answer:  Lab=Lab collect   12/06/23 0640   12/06/23 0640  HCV RNA quant  Once,   R       Question:  Specimen collection method  Answer:  Lab=Lab  collect   12/06/23 0640   12/06/23 0640  RPR  Once,   R       Question:  Specimen collection method  Answer:  Lab=Lab collect   12/06/23 0640   12/06/23 0640  HIV Antibody (routine testing w rflx)  (HIV Antibody (Routine testing w reflex) panel)  Once,   R       Question:  Specimen collection method  Answer:  Lab=Lab collect   12/06/23 0640   12/04/23 0500  Basic metabolic panel with GFR  Daily,   R      12/03/23 0908   12/04/23 0500  CBC  Daily,   R      12/03/23 0908   12/04/23 0500  Magnesium   Daily,   R      12/03/23 0908           Antimicrobials/Microbiology: Anti-infectives (From admission, onward)    Start     Dose/Rate Route Frequency Ordered Stop   12/06/23 0800  ceFAZolin  (ANCEF ) IVPB 2g/100 mL  premix        2 g 200 mL/hr over 30 Minutes Intravenous On call to O.R. 12/05/23 2149 12/07/23 0559   12/06/23 0800  vancomycin  (VANCOCIN ) IVPB 1000 mg/200 mL premix        1,000 mg 200 mL/hr over 60 Minutes Intravenous On call to O.R. 12/05/23 2149 12/07/23 0559   12/06/23 0600  ceFAZolin  (ANCEF ) IVPB 2g/100 mL premix  Status:  Discontinued        2 g 200 mL/hr over 30 Minutes Intravenous On call to O.R. 12/05/23 1132 12/05/23 1710   12/06/23 0600  vancomycin  (VANCOCIN ) IVPB 1000 mg/200 mL premix  Status:  Discontinued        1,000 mg 200 mL/hr over 60 Minutes Intravenous On call to O.R. 12/05/23 1132 12/05/23 1710   12/03/23 1230  linezolid  (ZYVOX ) IVPB 600 mg        600 mg 300 mL/hr over 60 Minutes Intravenous Every 12 hours 12/03/23 1204     12/03/23 1100  clindamycin  (CLEOCIN ) IVPB 900 mg  Status:  Discontinued        900 mg 100 mL/hr over 30 Minutes Intravenous Every 8 hours 12/03/23 0619 12/03/23 1204   12/03/23 1000  piperacillin -tazobactam (ZOSYN ) IVPB 3.375 g        3.375 g 12.5 mL/hr over 240 Minutes Intravenous Every 8 hours 12/03/23 0652     12/03/23 0652  vancomycin  variable dose per unstable renal function (pharmacist dosing)  Status:  Discontinued          Does not apply See admin instructions 12/03/23 0652 12/03/23 1204   12/03/23 0245  piperacillin -tazobactam (ZOSYN ) IVPB 3.375 g        3.375 g 100 mL/hr over 30 Minutes Intravenous  Once 12/03/23 0237 12/03/23 0338   12/03/23 0245  clindamycin  (CLEOCIN ) IVPB 900 mg        900 mg 100 mL/hr over 30 Minutes Intravenous  Once 12/03/23 0237 12/03/23 0338   12/03/23 0245  vancomycin  (VANCOCIN ) 1,750 mg in sodium chloride  0.9 % 500 mL IVPB  Status:  Discontinued        1,750 mg 258.8 mL/hr over 120 Minutes Intravenous  Once 12/03/23 0241 12/03/23 0242   12/03/23 0245  vancomycin  (VANCOREADY) IVPB 1750 mg/350 mL        1,750 mg 175 mL/hr over 120 Minutes Intravenous Once 12/03/23 0243 12/03/23 0548         Component Value Date/Time   SDES BLOOD BLOOD RIGHT ARM 12/03/2023 0258   SPECREQUEST  12/03/2023 0258    BOTTLES DRAWN AEROBIC AND ANAEROBIC Blood Culture adequate volume   CULT  12/03/2023 0258    NO GROWTH 3 DAYS Performed at [facility], [address], [place], [place] [address]    REPTSTATUS PENDING 12/03/2023 9741    Procedures: Procedure(s) (LRB): IRRIGATION AND DEBRIDEMENT WOUND (Left) Medications reviewed:  Scheduled Meds:  chlorhexidine   60 mL Topical Once   Chlorhexidine  Gluconate Cloth  6 each Topical Daily   magnesium  oxide  400 mg Oral BID   methadone   20 mg Oral Daily   mupirocin  ointment  1 Application Nasal BID   povidone-iodine   2 Application Topical Once   Continuous Infusions:   ceFAZolin  (ANCEF ) IV     linezolid  (ZYVOX ) IV 600 mg (12/06/23 9166)   piperacillin -tazobactam (ZOSYN )  IV 3.375 g (12/06/23 0201)   vancomycin       Mennie LAMY, MD [facility] 12/06/2023, 10:19 AM

## 2023-12-06 NOTE — Transfer of Care (Signed)
 Immediate Anesthesia Transfer of Care Note  Patient: Kelly [name]  Procedure(s) Performed: IRRIGATION AND DEBRIDEMENT LEFT LEG (Left: Leg Lower) IRRIGATION AND DEBRIDEMENT ARM (Left: Arm Lower)  Patient Location: PACU  Anesthesia Type:General  Level of Consciousness: awake, alert , and oriented  Airway & Oxygen Therapy: Patient Spontanous Breathing  Post-op Assessment: Report given to RN and Post -op Vital signs reviewed and stable  Post vital signs: Reviewed and stable  Last Vitals:  Vitals Value Taken Time  BP 136/107 12/06/23 17:15  Temp 36.9 C 12/06/23 17:02  Pulse 76 12/06/23 17:17  Resp 39 12/06/23 17:17  SpO2 97 % 12/06/23 17:17  Vitals shown include unfiled device data.  Last Pain:  Vitals:   12/06/23 1715  TempSrc:   PainSc: 10-Worst pain ever      Patients Stated Pain Goal: 3 (12/05/23 1458)  Complications: No notable events documented.

## 2023-12-06 NOTE — Interval H&P Note (Signed)
 History and Physical Interval Note:  12/06/2023 6:51 AM  Kelly [name]  has presented today for surgery, with the diagnosis of necrotizing fasciitis.  The various methods of treatment have been discussed with the patient and family. After consideration of risks, benefits and other options for treatment, the patient has consented to  Procedure(s) with comments: IRRIGATION AND DEBRIDEMENT WOUND (Left) - LEFT LOWER EXTREMITY I & D LEFT UPPER EXTREMITY EXCISION DEBRIDEMENT IRRIGATION AND DEBRIDEMENT SHOULDER (Left) as a surgical intervention.  The patient's history has been reviewed, patient examined, no change in status, stable for surgery.  I have reviewed the patient's chart and labs.  Questions were answered to the patient's satisfaction.     Tahj Lindseth V Essex Perry

## 2023-12-06 NOTE — Op Note (Signed)
 12/06/2023  5:14 PM  PATIENT:  Kelly [name]    PRE-OPERATIVE DIAGNOSIS:  necrotizing fasciitis left upper extremity, left lower extremity, left foot.  POST-OPERATIVE DIAGNOSIS:  Same  PROCEDURE: Excisional debridement left forearm and left dorsum of the hand. Excisional debridement medial and lateral left calf. Excisional debridement dorsum left foot. Application Kerecis micro graft to the left foot and left leg. Application Kerecis micro graft to the left hand and left forearm. Application of cleanse choice wound VAC sponges x 2. Application of peel in place wound VAC sponges x 4. Tissue sent for cultures from medial left calf. Cultures sent from wound lateral left calf.  SURGEON:  Jerona LULLA Sage, MD  PHYSICIAN ASSISTANT:None ANESTHESIA:   General  PREOPERATIVE INDICATIONS:  Kelly [name] is a  38 y.o. female with a diagnosis of necrotizing fasciitis who failed conservative measures and elected for surgical management.    The risks benefits and alternatives were discussed with the patient preoperatively including but not limited to the risks of infection, bleeding, nerve injury, cardiopulmonary complications, the need for revision surgery, among others, and the patient was willing to proceed.  OPERATIVE IMPLANTS:   Implant Name Type Inv. Item Serial No. Manufacturer Lot No. LRB No. Used Action  GRAFT SKIN WND SURGICLOSE M95 - [id number] Tissue GRAFT SKIN WND [place]  [employer] [id number] Left 1 Implanted  GRAFT SKIN WND SURGICLOSE M95 - [id number] Tissue GRAFT SKIN WND SURGICLOSE M95  [employer] [id number] Left 1 Implanted    @ENCIMAGES @  OPERATIVE FINDINGS: The infection of the left forearm and left hand is resolved and the wounds are closed. Patient had extensive necrotic fascia over the dorsum of the left foot lateral and anterior compartment left leg and medial gastrocnemius muscle.  The medial head of the gastrocnemius muscle was necrotic and  noncontractile.  OPERATIVE PROCEDURE: Patient was brought the operating room and underwent a general anesthetic.  After adequate levels anesthesia obtained the left upper and left lower extremity was prepped using DuraPrep draped into a sterile field a timeout was called.  The extensive wounds on the left forearm were clean and healthy viable tissue no further necrotizing fasciitis.  The wounds were debrided of skin soft tissue muscle and fascia irrigated with Vashe.  The wounds were then filled with 95 cm of Kerecis micro graft to cover a wound surface area greater than 100 cm.  Local tissue transfer was used to close the wounds after undermining the wounds on the forearm and the 2 wounds on the hand with a total length of 45 cm and 3 cm in width.  Attention was then focused on the left leg and foot.  The edges of the wound were necrotic.  A 21 blade knife was used to excise necrotic skin and soft tissue from around the wounds.  This left 2 wounds with the lateral wound 45 cm in length and 5 cm in width.  The medial wound was 35 cm in length and 5 cm in width.  There was necrotic fascia and dishwater fluid draining from the fascia.  Patient underwent extensive fasciectomy of both the anterior lateral compartments as well as the dorsum of the left foot.  Medially the gastrocnemius muscle had no contractility had poor consistency.  The muscle easily tore.  The medial gastrocnemius muscle was excised.  Electrocardio was used for hemostasis.  After the wounds had healthy viable margins the wounds were irrigated with Vashe irrigation.  The wound bed was then filled with 95  cm of Kerecis micro graft to cover the wound surface area greater than 200 cm.  The wounds were packed open with the cleanse choice wound VAC sponges with 3 sponges laterally and 2 sponges medially.  This was then covered with the peel in place.  This had a good suction fit this was overwrapped with Coban patient was extubated taken the PACU  in stable condition.   DISCHARGE PLANNING:  Antibiotic duration: Continue IV antibiotics adjust according to culture sensitivities.  Weightbearing: Patient may be weightbearing as tolerated  Pain medication: Continue opioid pathway  Dressing care/ Wound VAC: Wound VAC dressing for the left upper extremity and left lower extremity  Ambulatory devices: Walker  Plan for return to the operating room on Wednesday.

## 2023-12-07 DIAGNOSIS — L03114 Cellulitis of left upper limb: Secondary | ICD-10-CM | POA: Diagnosis not present

## 2023-12-07 LAB — HCV RNA QUANT: HCV Quantitative: NOT DETECTED [IU]/mL (ref >50)

## 2023-12-07 LAB — CBC
HCT: 23.2 % — ABNORMAL LOW (ref 36.0–46.0)
Hemoglobin: 7.3 g/dL — ABNORMAL LOW (ref 12.0–15.0)
MCH: 26.6 pg (ref 26.0–34.0)
MCHC: 31.5 g/dL (ref 30.0–36.0)
MCV: 84.7 fL (ref 80.0–100.0)
Platelets: 287 K/uL (ref 150–400)
RBC: 2.74 MIL/uL — ABNORMAL LOW (ref 3.87–5.11)
RDW: 15.3 % (ref 11.5–15.5)
WBC: 14.9 K/uL — ABNORMAL HIGH (ref 4.0–10.5)
nRBC: 0.2 % (ref 0.0–0.2)

## 2023-12-07 LAB — MAGNESIUM: Magnesium: 1.6 mg/dL — ABNORMAL LOW (ref 1.7–2.4)

## 2023-12-07 LAB — BASIC METABOLIC PANEL WITH GFR
Anion gap: 12 (ref 5–15)
BUN: 20 mg/dL (ref 6–20)
CO2: 26 mmol/L (ref 22–32)
Calcium: 8.4 mg/dL — ABNORMAL LOW (ref 8.9–10.3)
Chloride: 95 mmol/L — ABNORMAL LOW (ref 98–111)
Creatinine, Ser: 0.89 mg/dL (ref 0.44–1.00)
GFR, Estimated: >60 mL/min (ref >60)
Glucose, Bld: 92 mg/dL (ref 70–99)
Potassium: 5.1 mmol/L (ref 3.5–5.1)
Sodium: 133 mmol/L — ABNORMAL LOW (ref 135–145)

## 2023-12-07 MED ORDER — METHADONE HCL 10 MG PO TABS
25.0000 mg | ORAL_TABLET | Freq: Every day | ORAL | Status: DC
  Administered 2023-12-08 – 2023-12-11 (×4): 25 mg via ORAL
  Filled 2023-12-07 (×4): qty 3

## 2023-12-07 MED ORDER — METHOCARBAMOL 500 MG PO TABS
500.0000 mg | ORAL_TABLET | Freq: Four times a day (QID) | ORAL | Status: DC | PRN
  Administered 2023-12-07: 500 mg via ORAL
  Filled 2023-12-07: qty 1

## 2023-12-07 NOTE — Progress Notes (Signed)
 2047: received a call from telesitter regarding patient acting is a suspicious way. On going inside her room and checking her bed toward her right side as directed by telesitter, found a metal straw which she was trying to hide and was saying that she was using that for drinking. The straw however didn't seem like was been used for drinking liquid and its end part had some white powder residue. The straw was then taken from her. Patient complaining about being harassed every day. She was then moved to bed side commode for changing the bed sheet. A small white rock like substance was obtained from her sheet. Security was notified who took the substance along with the straw wrapped in 2 plastic bag and her label in it.

## 2023-12-07 NOTE — Progress Notes (Signed)
 Wound vac for left arm - output (0) Wound vac for left leg - output (200)

## 2023-12-07 NOTE — Anesthesia Postprocedure Evaluation (Signed)
 Anesthesia Post Note  Patient: Kelly [name]  Procedure(s) Performed: IRRIGATION AND DEBRIDEMENT LEFT LEG (Left: Leg Lower) IRRIGATION AND DEBRIDEMENT ARM (Left: Arm Lower)     Patient location during evaluation: PACU Anesthesia Type: General Level of consciousness: awake and alert Pain management: pain level controlled Vital Signs Assessment: post-procedure vital signs reviewed and stable Respiratory status: spontaneous breathing, nonlabored ventilation, respiratory function stable and patient connected to nasal cannula oxygen Cardiovascular status: blood pressure returned to baseline and stable Postop Assessment: no apparent nausea or vomiting Anesthetic complications: no   No notable events documented.  Last Vitals:  Vitals:   12/07/23 0429 12/07/23 0832  BP: 118/81 113/81  Pulse: 78 69  Resp: 18 16  Temp: 36.4 C 36.4 C  SpO2: 97% 96%    Last Pain:  Vitals:   12/07/23 0842  TempSrc:   PainSc: 9                  Thom JONELLE Peoples

## 2023-12-07 NOTE — Progress Notes (Signed)
 PROGRESS NOTE Kelly [name]  FMW:[id number] DOB: 07-12-1985 DOA: 12/03/2023 PCP: Pcp, No  Brief Narrative/Hospital Course: 38 y.o. 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 difficile infection, bipolar disorder, hep C, hypertension, anemia presenting with complaints of rapidly progressive left leg erythema, pain, and swelling x 2 days PTA. In ED found to be septic, CT scan showed concerns of cellulitis of left lower extremity along with necrotizing fasciitis.she was started on broad-spectrum antibiotics and orthopedic consulted who performed excisional debridement on 12/03/22.  Subjective: Patient seen and examined this morning She is resting comfortably Overnight afebrile BP stable at times in 90s Labs reviewed this morning WBC nicely improving 14.9, mag 1.6 K stable  Assessment & Plan:   Severe sepsis POA  Left lower extremity necrotizing fasciitis  Left upper extremity abscess/necrotizing fasciitis IVDA s/p I&D orthopedic on 7/22> on wound vac,Dr Harden following and plan for repeat debridement on Wednesday Underwent I&D 7/25 on LUE , and wound VAC application as wel byl Dr Carlton further surgery anticipated on LUE ID following closely, blood cultures since admission no growth. Await PICC line due to IVDA/not a candidate for home IV antibiotic therapy. Continue Zosyn , linezolid  IV Continue pain management pharmacy consulted and placed on methadone , she repoets she can follow-up at methadone  clinic- was on it previously  Hep C positive: Follow-up viral load as per ID  AKI with metabolic acidosis AKI has resolved.   Anemia of chronic disease: Some drop suspect due to patient's acute illness.  Monitor and transfuse if less than 7 g  Hyponatremia Hypokalemia Hypomagnesemia: Replace and monitor  electrolytes  Recent Labs  Lab 12/03/23 0256 12/03/23 1108 12/04/23 0836 12/05/23 0604 12/07/23 0419  K 3.1* 3.0* 3.3* 3.9 5.1  CALCIUM  8.5* 8.1* 8.2* 8.2* 8.4*  MG  --  1.0* 1.7 1.5* 1.6*  PHOS  --   --  3.4  --   --    HFmrEF History of severe TR and pulmonary hypertension Last echo done in September 2022 showing EF 40 to 45%.  No signs of volume overload, appears to be compensated.  repeat echocardiogram > EF 70-75%, dissipate left ventricle with right ventricular pressure overload mildly reduced RV SF severely elevated pulmonary artery systolic pressure severely dilated RA massive tricuspid valve regurgitation with severely dilated TV annulus with known history of TV endocarditis.  Advised to follow-up with outpatient cardiology   Ongoing substance abuse/IV drug abuse TOC consulted for counseling.  Patient reports she has been snorting and has not recently used IV drugs.  Reports being on methadone  and asking if she can resume.  Pharmacy consulted slowly started methadone > uptitrate slowly.  DVT prophylaxis: SCDs Start: 12/03/23 0617 Code Status:   Code Status: Full Code Family Communication: plan of care discussed with patient at bedside. Patient status is: Remains hospitalized because of severity of illness Level of care: Med-Surg   Dispo: The patient is from: home            Anticipated disposition: TBD Objective: Vitals last 24 hrs: Vitals:   12/06/23 2124 12/06/23 2155 12/07/23 0429 12/07/23 0832  BP: 131/73 90/62 118/81 113/81  Pulse: 86 87 78 69  Resp: 18  18 16   Temp: 98 F (36.7 C) 98.2 F (36.8 C) 97.6 F (36.4 C) 97.6 F (36.4 C)  TempSrc: Oral Oral Oral Oral  SpO2: 90% 95% 97% 96%  Weight:  Height:        Physical Examination: General exam: alert awake, pleasant and appears older than her age HEENT:Oral mucosa moist, Ear/Nose WNL grossly Respiratory system: Bilaterally clear BS, no use of accessory muscle Cardiovascular system: S1 & S2  +. Gastrointestinal system: Abdomen soft, NT,ND,BS+ Nervous System: Alert, awake, following commands. Extremities: LUE and LLE with postop dressing in place along with wound VACs Skin: No rashes,warm. MSK: Normal muscle bulk/tone.      Data Reviewed: I have personally reviewed following labs and imaging studies ( see epic result tab) CBC: Recent Labs  Lab 12/03/23 0256 12/03/23 1108 12/04/23 0836 12/05/23 0604 12/06/23 0901 12/07/23 0419  WBC 27.6* 18.5* 18.2* 18.0* 19.3* 14.9*  NEUTROABS 27.0*  --   --   --   --   --   HGB 10.8* 9.2* 9.3* 8.3* 8.3* 7.3*  HCT 33.6* 28.7* 28.3* 26.4* 25.8* 23.2*  MCV 84.2 84.2 82.7 84.1 83.5 84.7  PLT 279 230 191 209 272 287   CMP: Recent Labs  Lab 12/03/23 0256 12/03/23 1108 12/04/23 0836 12/05/23 0604 12/07/23 0419  NA 127* 134* 130* 133* 133*  K 3.1* 3.0* 3.3* 3.9 5.1  CL 94* 100 98 99 95*  CO2 19* 18* 21* 22 26  GLUCOSE 111* 79 90 77 92  BUN 46* 39* 22* 18 20  CREATININE 2.30* 1.55* 0.91 0.79 0.89  CALCIUM  8.5* 8.1* 8.2* 8.2* 8.4*  MG  --  1.0* 1.7 1.5* 1.6*  PHOS  --   --  3.4  --   --    GFR: Estimated Creatinine Clearance: 89.6 mL/min (by C-G formula based on SCr of 0.89 mg/dL). Recent Labs  Lab 12/03/23 0256  AST 29  ALT 10  ALKPHOS 121  BILITOT 1.0  PROT 6.7  ALBUMIN 2.6*   No results for input(s): LIPASE, AMYLASE in the last 168 hours. No results for input(s): AMMONIA in the last 168 hours. Coagulation Profile:  Recent Labs  Lab 12/03/23 0256  INR 1.0   Unresulted Labs (From admission, onward)     Start     Ordered   12/06/23 0640  HCV RNA quant  Once,   R       Question:  Specimen collection method  Answer:  Lab=Lab collect   12/06/23 0640   12/04/23 0500  Basic metabolic panel with GFR  Daily,   R      12/03/23 0908   12/04/23 0500  CBC  Daily,   R      12/03/23 0908   12/04/23 0500  Magnesium   Daily,   R      12/03/23 0908           Antimicrobials/Microbiology: Anti-infectives (From  admission, onward)    Start     Dose/Rate Route Frequency Ordered Stop   12/06/23 0800  ceFAZolin  (ANCEF ) IVPB 2g/100 mL premix  Status:  Discontinued        2 g 200 mL/hr over 30 Minutes Intravenous On call to O.R. 12/05/23 2149 12/06/23 1744   12/06/23 0800  vancomycin  (VANCOCIN ) IVPB 1000 mg/200 mL premix        1,000 mg 200 mL/hr over 60 Minutes Intravenous On call to O.R. 12/05/23 2149 12/06/23 1754   12/06/23 0600  ceFAZolin  (ANCEF ) IVPB 2g/100 mL premix  Status:  Discontinued        2 g 200 mL/hr over 30 Minutes Intravenous On call to O.R. 12/05/23 1132 12/05/23 1710   12/06/23 0600  vancomycin  (VANCOCIN ) IVPB  1000 mg/200 mL premix  Status:  Discontinued        1,000 mg 200 mL/hr over 60 Minutes Intravenous On call to O.R. 12/05/23 1132 12/05/23 1710   12/03/23 1230  linezolid  (ZYVOX ) IVPB 600 mg        600 mg 300 mL/hr over 60 Minutes Intravenous Every 12 hours 12/03/23 1204     12/03/23 1100  clindamycin  (CLEOCIN ) IVPB 900 mg  Status:  Discontinued        900 mg 100 mL/hr over 30 Minutes Intravenous Every 8 hours 12/03/23 0619 12/03/23 1204   12/03/23 1000  piperacillin -tazobactam (ZOSYN ) IVPB 3.375 g        3.375 g 12.5 mL/hr over 240 Minutes Intravenous Every 8 hours 12/03/23 0652     12/03/23 0652  vancomycin  variable dose per unstable renal function (pharmacist dosing)  Status:  Discontinued         Does not apply See admin instructions 12/03/23 0652 12/03/23 1204   12/03/23 0245  piperacillin -tazobactam (ZOSYN ) IVPB 3.375 g        3.375 g 100 mL/hr over 30 Minutes Intravenous  Once 12/03/23 0237 12/03/23 0338   12/03/23 0245  clindamycin  (CLEOCIN ) IVPB 900 mg        900 mg 100 mL/hr over 30 Minutes Intravenous  Once 12/03/23 0237 12/03/23 0338   12/03/23 0245  vancomycin  (VANCOCIN ) 1,750 mg in sodium chloride  0.9 % 500 mL IVPB  Status:  Discontinued        1,750 mg 258.8 mL/hr over 120 Minutes Intravenous  Once 12/03/23 0241 12/03/23 0242   12/03/23 0245  vancomycin   (VANCOREADY) IVPB 1750 mg/350 mL        1,750 mg 175 mL/hr over 120 Minutes Intravenous Once 12/03/23 0243 12/03/23 0548         Component Value Date/Time   SDES TISSUE LEFT LEG 12/06/2023 1621   SPECREQUEST MEDIAL SAMPLE B, PT ON VANC 12/06/2023 1621   CULT  12/06/2023 1621    NO GROWTH < 12 HOURS Performed at [facility], [address], [place], [place] [address]    REPTSTATUS PENDING 12/06/2023 1621    Procedures: Procedure(s) (LRB): IRRIGATION AND DEBRIDEMENT LEFT LEG (Left) IRRIGATION AND DEBRIDEMENT ARM (Left) Medications reviewed:  Scheduled Meds:  Chlorhexidine  Gluconate Cloth  6 each Topical Daily   ketorolac   15 mg Intravenous Q6H   magnesium  oxide  400 mg Oral BID   [START ON 12/08/2023] methadone   25 mg Oral Daily   Continuous Infusions:  linezolid  (ZYVOX ) IV 600 mg (12/07/23 0850)   piperacillin -tazobactam (ZOSYN )  IV 3.375 g (12/07/23 0235)    Mennie LAMY, MD [facility] 12/07/2023, 11:11 AM

## 2023-12-07 NOTE — Progress Notes (Signed)
 According to the primary nurse patient complaining of severe pain and demanding for more pain medicine but found her knocked out with difficulty arousal when trying to give her some oxycodone . The nurse reached out to tele sitter who refused seeing any suspicious activities. Patient with her bag on the side table. This nurse searched her bag to see if she had anything in her bag that she has taken, making her drowsy. A packet of cigarette, a bottle of naproxen , and 7 bullets (without gun) were obtained from her bag. Rapid response was notified about patient's condition. Patient arousable but still drowsy. Security was made aware about the bullets. Bullets sealed inside the plastic bag with patient label were sent with security supervisor Oneil King to store down stairs in security office. Naproxen  will be sent to pharmacy once patient becomes more alert. Will continue to monitor the patient.

## 2023-12-07 NOTE — Progress Notes (Signed)
 Patient ID: Kelly [name], female   DOB: [birth year], 38 y.o.   MRN: [id number] Patient is postoperative day 1 repeat debridement of the left lower extremity and left upper extremity for necrotizing fasciitis.  There is 300 cc in the wound VAC canister for the left leg.  There is no drainage in the canister for the left upper extremity.  Anticipate no further intervention for the left arm.  Will plan for repeat debridement of the left leg on Wednesday.

## 2023-12-08 DIAGNOSIS — L03114 Cellulitis of left upper limb: Secondary | ICD-10-CM | POA: Diagnosis not present

## 2023-12-08 LAB — CULTURE, BLOOD (ROUTINE X 2)
Culture: NO GROWTH
Culture: NO GROWTH
Special Requests: ADEQUATE

## 2023-12-08 MED ORDER — HYDROMORPHONE HCL 1 MG/ML IJ SOLN
0.5000 mg | INTRAMUSCULAR | Status: DC | PRN
  Administered 2023-12-08 – 2023-12-14 (×26): 0.5 mg via INTRAVENOUS
  Filled 2023-12-08 (×26): qty 1

## 2023-12-08 MED ORDER — METHOCARBAMOL 750 MG PO TABS
750.0000 mg | ORAL_TABLET | Freq: Four times a day (QID) | ORAL | Status: DC | PRN
  Administered 2023-12-08 – 2023-12-15 (×14): 750 mg via ORAL
  Filled 2023-12-08 (×15): qty 1

## 2023-12-08 MED ORDER — KETOROLAC TROMETHAMINE 30 MG/ML IJ SOLN
30.0000 mg | Freq: Once | INTRAMUSCULAR | Status: AC
  Administered 2023-12-08: 30 mg via INTRAVENOUS
  Filled 2023-12-08: qty 1

## 2023-12-08 NOTE — Plan of Care (Signed)

## 2023-12-08 NOTE — Progress Notes (Signed)
 PROGRESS NOTE Kelly [name]  FMW:[id number] DOB: 1985-05-21 DOA: 12/03/2023 PCP: Pcp, No  Brief Narrative/Hospital Course: 38 y.o. 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 difficile infection, bipolar disorder, hep C, hypertension, anemia presenting with complaints of rapidly progressive left leg erythema, pain, and swelling x 2 days PTA. In ED found to be septic, CT scan showed concerns of cellulitis of left lower extremity along with necrotizing fasciitis.she was started on broad-spectrum antibiotics and orthopedic consulted  S/P LLE I&D 7/22> Wound vac placed S/P LUE I&D 7/25>Wound vac placed.  Subjective: Patient seen and examined Complains of ongoing pain and spasm of her extremities in the left Small area of redness on the right send appears improved Overnight on safety sitter due to suspicious behavior and substance found and confiscated by security Overnight afebrile BP stable at times in 90s Labs reviewed this morning WBC nicely improving 14.9, mag 1.6 K stable  Assessment & Plan:   Severe sepsis POA  Left lower extremity necrotizing fasciitis  Left upper extremity abscess/necrotizing fasciitis IVDA: S/P LLE I&D 7/22> Wound vac placed>Dr Harden following and plan for repeat debridement on Wednesday S/P LUE I&D 7/25>Wound vac placed> not anticipating further surgery ID following closely, blood cultures since admission no growth. Cannot get PICC line due to IVDA/not a candidate for home IV antibiotic therapy. Continue Zosyn , linezolid  IV Continue pain management >placed on methadone , she reports she can follow-up at methadone  clinic- was on it previously, increase methadone   Patient with suspicious behavior found to have a metal cuboid white powder and small white rock like substance overnight  and security has removed the substance, we will keep on safety sitter Add antispasmodic  Hep C positive: HIV negative, HCV RNA not detected  AKI with metabolic acidosis resolved.   Anemia of chronic disease: Some drop suspect due to patient's acute illness.  Monitor and transfuse if less than 7 g Recent Labs  Lab 12/03/23 1108 12/04/23 0836 12/05/23 0604 12/06/23 0901 12/07/23 0419  HGB 9.2* 9.3* 8.3* 8.3* 7.3*  HCT 28.7* 28.3* 26.4* 25.8* 23.2*    Hyponatremia Hypokalemia Hypomagnesemia: Replace and monitor electrolytes  Recent Labs  Lab 12/03/23 0256 12/03/23 1108 12/04/23 0836 12/05/23 0604 12/07/23 0419  K 3.1* 3.0* 3.3* 3.9 5.1  CALCIUM  8.5* 8.1* 8.2* 8.2* 8.4*  MG  --  1.0* 1.7 1.5* 1.6*  PHOS  --   --  3.4  --   --    HFmrEF History of severe TR and pulmonary hypertension Last echo done in September 2022 showing EF 40 to 45%.  No signs of volume overload, appears to be compensated.  repeat echocardiogram > EF 70-75%, dissipate left ventricle with right ventricular pressure overload mildly reduced RV SF severely elevated pulmonary artery systolic pressure severely dilated RA massive tricuspid valve regurgitation with severely dilated TV annulus with known history of TV endocarditis.  Advised to follow-up with outpatient cardiology   Ongoing substance abuse/IV drug abuse TOC consulted for counseling.  Patient reports she has been snorting and has not recently used IV drugs.  Reports being on methadone  and asking if she can resume.  Pharmacy consulted slowly started methadone > uptitrate slowly.  DVT prophylaxis: SCDs Start: 12/03/23 0617 Code Status:   Code Status: Full Code Family Communication: plan of care discussed with patient at bedside. Patient status is: Remains hospitalized because of severity of illness Level of care: Med-Surg  Dispo: The patient is from: home            Anticipated disposition: TBD Objective: Vitals last 24 hrs: Vitals:    12/07/23 1952 12/08/23 0432 12/08/23 0454 12/08/23 0757  BP: 118/81 (!) 154/117 (!) 138/98 (!) 121/92  Pulse: 82 87 70 82  Resp: 18 18  15   Temp: 98.3 F (36.8 C) 97.6 F (36.4 C)  98.2 F (36.8 C)  TempSrc: Oral Oral  Oral  SpO2: 99% 98%  95%  Weight:      Height:        Physical Examination: General exam: alert awake, pleasant and appears older than her age HEENT:Oral mucosa moist, Ear/Nose WNL grossly Respiratory system: Bilaterally clear BS, no use of accessory muscle Cardiovascular system: S1 & S2 +. Gastrointestinal system: Abdomen soft, NT,ND,BS+ Nervous System: Alert, awake, following commands. Extremities: LUE/LLE dressing C/D/I w/ wound VAC Skin: No rashes,warm. MSK: Normal muscle bulk/tone.      Data Reviewed: I have personally reviewed following labs and imaging studies ( see epic result tab) CBC: Recent Labs  Lab 12/03/23 0256 12/03/23 1108 12/04/23 0836 12/05/23 0604 12/06/23 0901 12/07/23 0419  WBC 27.6* 18.5* 18.2* 18.0* 19.3* 14.9*  NEUTROABS 27.0*  --   --   --   --   --   HGB 10.8* 9.2* 9.3* 8.3* 8.3* 7.3*  HCT 33.6* 28.7* 28.3* 26.4* 25.8* 23.2*  MCV 84.2 84.2 82.7 84.1 83.5 84.7  PLT 279 230 191 209 272 287   CMP: Recent Labs  Lab 12/03/23 0256 12/03/23 1108 12/04/23 0836 12/05/23 0604 12/07/23 0419  NA 127* 134* 130* 133* 133*  K 3.1* 3.0* 3.3* 3.9 5.1  CL 94* 100 98 99 95*  CO2 19* 18* 21* 22 26  GLUCOSE 111* 79 90 77 92  BUN 46* 39* 22* 18 20  CREATININE 2.30* 1.55* 0.91 0.79 0.89  CALCIUM  8.5* 8.1* 8.2* 8.2* 8.4*  MG  --  1.0* 1.7 1.5* 1.6*  PHOS  --   --  3.4  --   --    GFR: Estimated Creatinine Clearance: 89.6 mL/min (by C-G formula based on SCr of 0.89 mg/dL). Recent Labs  Lab 12/03/23 0256  AST 29  ALT 10  ALKPHOS 121  BILITOT 1.0  PROT 6.7  ALBUMIN 2.6*   No results for input(s): LIPASE, AMYLASE in the last 168 hours. No results for input(s): AMMONIA in the last 168 hours. Coagulation Profile:  Recent Labs   Lab 12/03/23 0256  INR 1.0   Unresulted Labs (From admission, onward)     Start     Ordered   12/04/23 0500  Basic metabolic panel with GFR  Daily,   R      12/03/23 0908   12/04/23 0500  CBC  Daily,   R      12/03/23 0908   12/04/23 0500  Magnesium   Daily,   R      12/03/23 0908           Antimicrobials/Microbiology: Anti-infectives (From admission, onward)    Start     Dose/Rate Route Frequency Ordered Stop   12/06/23 0800  ceFAZolin  (ANCEF ) IVPB 2g/100 mL premix  Status:  Discontinued        2 g 200 mL/hr over 30 Minutes Intravenous On call to O.R. 12/05/23 2149 12/06/23 1744   12/06/23 0800  vancomycin  (VANCOCIN ) IVPB 1000 mg/200 mL premix        1,000 mg 200 mL/hr over 60 Minutes Intravenous On call  to O.R. 12/05/23 2149 12/06/23 1754   12/06/23 0600  ceFAZolin  (ANCEF ) IVPB 2g/100 mL premix  Status:  Discontinued        2 g 200 mL/hr over 30 Minutes Intravenous On call to O.R. 12/05/23 1132 12/05/23 1710   12/06/23 0600  vancomycin  (VANCOCIN ) IVPB 1000 mg/200 mL premix  Status:  Discontinued        1,000 mg 200 mL/hr over 60 Minutes Intravenous On call to O.R. 12/05/23 1132 12/05/23 1710   12/03/23 1230  linezolid  (ZYVOX ) IVPB 600 mg        600 mg 300 mL/hr over 60 Minutes Intravenous Every 12 hours 12/03/23 1204     12/03/23 1100  clindamycin  (CLEOCIN ) IVPB 900 mg  Status:  Discontinued        900 mg 100 mL/hr over 30 Minutes Intravenous Every 8 hours 12/03/23 0619 12/03/23 1204   12/03/23 1000  piperacillin -tazobactam (ZOSYN ) IVPB 3.375 g        3.375 g 12.5 mL/hr over 240 Minutes Intravenous Every 8 hours 12/03/23 0652     12/03/23 0652  vancomycin  variable dose per unstable renal function (pharmacist dosing)  Status:  Discontinued         Does not apply See admin instructions 12/03/23 0652 12/03/23 1204   12/03/23 0245  piperacillin -tazobactam (ZOSYN ) IVPB 3.375 g        3.375 g 100 mL/hr over 30 Minutes Intravenous  Once 12/03/23 0237 12/03/23 0338    12/03/23 0245  clindamycin  (CLEOCIN ) IVPB 900 mg        900 mg 100 mL/hr over 30 Minutes Intravenous  Once 12/03/23 0237 12/03/23 0338   12/03/23 0245  vancomycin  (VANCOCIN ) 1,750 mg in sodium chloride  0.9 % 500 mL IVPB  Status:  Discontinued        1,750 mg 258.8 mL/hr over 120 Minutes Intravenous  Once 12/03/23 0241 12/03/23 0242   12/03/23 0245  vancomycin  (VANCOREADY) IVPB 1750 mg/350 mL        1,750 mg 175 mL/hr over 120 Minutes Intravenous Once 12/03/23 0243 12/03/23 0548         Component Value Date/Time   SDES TISSUE LEFT LEG 12/06/2023 1621   SPECREQUEST MEDIAL SAMPLE B, PT ON VANC 12/06/2023 1621   CULT  12/06/2023 1621    NO GROWTH 2 DAYS Performed at [facility], [address] [place], [place] [address]    REPTSTATUS PENDING 12/06/2023 1621    Procedures: Procedure(s) (LRB): IRRIGATION AND DEBRIDEMENT LEFT LEG (Left) IRRIGATION AND DEBRIDEMENT ARM (Left) Medications reviewed:  Scheduled Meds:  methadone   25 mg Oral Daily   Continuous Infusions:  linezolid  (ZYVOX ) IV 600 mg (12/08/23 0937)   piperacillin -tazobactam (ZOSYN )  IV 3.375 g (12/08/23 0929)    Mennie LAMY, MD [facility] 12/08/2023, 10:46 AM

## 2023-12-09 ENCOUNTER — Encounter (HOSPITAL_COMMUNITY): Payer: Self-pay | Admitting: Orthopedic Surgery

## 2023-12-09 DIAGNOSIS — L03114 Cellulitis of left upper limb: Secondary | ICD-10-CM | POA: Diagnosis not present

## 2023-12-09 LAB — CBC
HCT: 23.2 % — ABNORMAL LOW (ref 36.0–46.0)
Hemoglobin: 7.5 g/dL — ABNORMAL LOW (ref 12.0–15.0)
MCH: 26.8 pg (ref 26.0–34.0)
MCHC: 32.3 g/dL (ref 30.0–36.0)
MCV: 82.9 fL (ref 80.0–100.0)
Platelets: 397 K/uL (ref 150–400)
RBC: 2.8 MIL/uL — ABNORMAL LOW (ref 3.87–5.11)
RDW: 14.9 % (ref 11.5–15.5)
WBC: 19 K/uL — ABNORMAL HIGH (ref 4.0–10.5)
nRBC: 0.3 % — ABNORMAL HIGH (ref 0.0–0.2)

## 2023-12-09 LAB — BASIC METABOLIC PANEL WITH GFR
Anion gap: 11 (ref 5–15)
BUN: 11 mg/dL (ref 6–20)
CO2: 25 mmol/L (ref 22–32)
Calcium: 8.4 mg/dL — ABNORMAL LOW (ref 8.9–10.3)
Chloride: 96 mmol/L — ABNORMAL LOW (ref 98–111)
Creatinine, Ser: 0.79 mg/dL (ref 0.44–1.00)
GFR, Estimated: >60 mL/min (ref >60)
Glucose, Bld: 81 mg/dL (ref 70–99)
Potassium: 4.6 mmol/L (ref 3.5–5.1)
Sodium: 132 mmol/L — ABNORMAL LOW (ref 135–145)

## 2023-12-09 LAB — MAGNESIUM: Magnesium: 1.8 mg/dL (ref 1.7–2.4)

## 2023-12-09 MED ORDER — PENICILLIN G POTASSIUM 20000000 UNITS IJ SOLR
24.0000 10*6.[IU] | INTRAVENOUS | Status: AC
  Administered 2023-12-09 – 2023-12-15 (×7): 24 10*6.[IU] via INTRAVENOUS
  Filled 2023-12-09: qty 24
  Filled 2023-12-09: qty 20
  Filled 2023-12-09: qty 24
  Filled 2023-12-09: qty 20
  Filled 2023-12-09: qty 24
  Filled 2023-12-09 (×2): qty 20

## 2023-12-09 NOTE — Progress Notes (Signed)
 PROGRESS NOTE Kelly [name]  FMW:[id number] DOB: 1985/09/25 DOA: 12/03/2023 PCP: Pcp, No  Brief Narrative/Hospital Course: 38 y.o. 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 difficile infection, bipolar disorder, hep C, hypertension, anemia presenting with complaints of rapidly progressive left leg erythema, pain, and swelling x 2 days PTA. In ED found to be septic, CT scan showed concerns of cellulitis of left lower extremity along with necrotizing fasciitis.she was started on broad-spectrum antibiotics and orthopedic consulted  S/P LLE I&D 7/22> Wound vac placed S/P LUE I&D 7/25>Wound vac placed.  Subjective: Patient seen and examined Asking for more methadone , complains of some sweating Overnight patient remains afebrile BP stable  Labs reviewed from yesterday with improving WBC count, and electrolytes are stable  Assessment & Plan:   Severe sepsis POA  Left lower extremity necrotizing fasciitis  Left upper extremity abscess/necrotizing fasciitis IVDA hx: S/P LLE I&D 7/22> Wound vac placed>Dr Harden following and plan for repeat debridement on Wednesday S/P LUE I&D 7/25>Wound vac placed> not anticipating further surgery ID following closely, blood cultures since admission no growth. Cannot get PICC line due to IVDA/not a candidate for home IV antibiotic therapy. Continue Zosyn , linezolid  IV and pain control Previously she was on methadone , methadone  has been started- uptitrate slowly-somewhat limited by prolonged Qtc, will check  EKG, discussed with pharmacy On 7/26 Patient with suspicious behavior found to have white powder and small white rock like substance and security has removed the substance, cont tele safety sitter  Hep C positive: HIV negative, HCV RNA not detected  AKI with metabolic  acidosis resolved.   Anemia of chronic disease: Some drop suspect due to patient's acute illness.  Monitor and transfuse if <7 g Recent Labs  Lab 12/03/23 1108 12/04/23 0836 12/05/23 0604 12/06/23 0901 12/07/23 0419  HGB 9.2* 9.3* 8.3* 8.3* 7.3*  HCT 28.7* 28.3* 26.4* 25.8* 23.2*    Hyponatremia Hypokalemia Hypomagnesemia: Labs improved.  Monitor   HFmrEF History of severe TR and pulmonary hypertension Last echo done in September 2022 showing EF 40 to 45%.  No signs of volume overload, appears to be compensated.  repeat echocardiogram > EF 70-75%, dissipate left ventricle with right ventricular pressure overload mildly reduced RV SF severely elevated pulmonary artery systolic pressure severely dilated RA massive tricuspid valve regurgitation with severely dilated TV annulus with known history of TV endocarditis.  Advised to follow-up with outpatient cardiology   Ongoing substance abuse/IV drug abuse TOC consulted for counseling.  Patient reports she has been snorting and has not recently used IV drugs.  Reports being on methadone  and asking if she can resume.  Pharmacy consulted slowly started methadone > uptitrate slowly.  DVT prophylaxis: SCDs Start: 12/03/23 0617 add chemical prophylaxis hold preop Code Status:   Code Status: Full Code Family Communication: plan of care discussed with patient at bedside. Patient status is: Remains hospitalized because of severity of illness Level of care: Med-Surg   Dispo: The patient is from: home            Anticipated disposition: TBD Objective: Vitals last 24 hrs: Vitals:   12/08/23 0757 12/08/23 1535 12/08/23 2018 12/09/23 0621  BP: (!) 121/92 (!) 132/94 (!) 144/96 (!) 141/89  Pulse: 82 81 87   Resp: 15 16 18 16   Temp: 98.2 F (36.8 C) 98.2 F (36.8 C) 98.2 F (36.8 C) 98.6 F (37 C)  TempSrc: Oral  Oral Oral Oral  SpO2: 95% 100% 100% 96%  Weight:      Height:        Physical Examination: General exam: alert awake,  oriented, appears older than stated age HEENT:Oral mucosa moist, Ear/Nose WNL grossly Respiratory system: Bilaterally clear BS,no use of accessory muscle Cardiovascular system: S1 & S2 +, No JVD. Gastrointestinal system: Abdomen soft,NT,ND, BS+ Nervous System: Alert, awake, moving all extremities,and following commands. Extremities: LUE/LLE dressing C/D/I w/ wound VAC-expected tenderness present Skin: No rashes,no icterus. MSK: Normal muscle bulk,tone, power     Data Reviewed: I have personally reviewed following labs and imaging studies ( see epic result tab) CBC: Recent Labs  Lab 12/03/23 0256 12/03/23 1108 12/04/23 0836 12/05/23 0604 12/06/23 0901 12/07/23 0419 12/09/23 0709  WBC 27.6*   < > 18.2* 18.0* 19.3* 14.9* 19.0*  NEUTROABS 27.0*  --   --   --   --   --   --   HGB 10.8*   < > 9.3* 8.3* 8.3* 7.3* 7.5*  HCT 33.6*   < > 28.3* 26.4* 25.8* 23.2* 23.2*  MCV 84.2   < > 82.7 84.1 83.5 84.7 82.9  PLT 279   < > 191 209 272 287 397   < > = values in this interval not displayed.   CMP: Recent Labs  Lab 12/03/23 1108 12/04/23 0836 12/05/23 0604 12/07/23 0419 12/09/23 0709  NA 134* 130* 133* 133* 132*  K 3.0* 3.3* 3.9 5.1 4.6  CL 100 98 99 95* 96*  CO2 18* 21* 22 26 25   GLUCOSE 79 90 77 92 81  BUN 39* 22* 18 20 11   CREATININE 1.55* 0.91 0.79 0.89 0.79  CALCIUM  8.1* 8.2* 8.2* 8.4* 8.4*  MG 1.0* 1.7 1.5* 1.6* 1.8  PHOS  --  3.4  --   --   --    GFR: Estimated Creatinine Clearance: 99.7 mL/min (by C-G formula based on SCr of 0.79 mg/dL). Recent Labs  Lab 12/03/23 0256  AST 29  ALT 10  ALKPHOS 121  BILITOT 1.0  PROT 6.7  ALBUMIN 2.6*   No results for input(s): LIPASE, AMYLASE in the last 168 hours. No results for input(s): AMMONIA in the last 168 hours. Coagulation Profile:  Recent Labs  Lab 12/03/23 0256  INR 1.0   Unresulted Labs (From admission, onward)     Start     Ordered   12/04/23 0500  Basic metabolic panel with GFR  Daily,   R      12/03/23  0908   12/04/23 0500  CBC  Daily,   R      12/03/23 0908   12/04/23 0500  Magnesium   Daily,   R      12/03/23 0908           Antimicrobials/Microbiology: Anti-infectives (From admission, onward)    Start     Dose/Rate Route Frequency Ordered Stop   12/06/23 0800  ceFAZolin  (ANCEF ) IVPB 2g/100 mL premix  Status:  Discontinued        2 g 200 mL/hr over 30 Minutes Intravenous On call to O.R. 12/05/23 2149 12/06/23 1744   12/06/23 0800  vancomycin  (VANCOCIN ) IVPB 1000 mg/200 mL premix        1,000 mg 200 mL/hr over 60 Minutes Intravenous On call to O.R. 12/05/23 2149 12/06/23 1754   12/06/23 0600  ceFAZolin  (ANCEF ) IVPB 2g/100 mL premix  Status:  Discontinued        2 g 200 mL/hr over 30 Minutes  Intravenous On call to O.R. 12/05/23 1132 12/05/23 1710   12/06/23 0600  vancomycin  (VANCOCIN ) IVPB 1000 mg/200 mL premix  Status:  Discontinued        1,000 mg 200 mL/hr over 60 Minutes Intravenous On call to O.R. 12/05/23 1132 12/05/23 1710   12/03/23 1230  linezolid  (ZYVOX ) IVPB 600 mg        600 mg 300 mL/hr over 60 Minutes Intravenous Every 12 hours 12/03/23 1204     12/03/23 1100  clindamycin  (CLEOCIN ) IVPB 900 mg  Status:  Discontinued        900 mg 100 mL/hr over 30 Minutes Intravenous Every 8 hours 12/03/23 0619 12/03/23 1204   12/03/23 1000  piperacillin -tazobactam (ZOSYN ) IVPB 3.375 g        3.375 g 12.5 mL/hr over 240 Minutes Intravenous Every 8 hours 12/03/23 0652     12/03/23 0652  vancomycin  variable dose per unstable renal function (pharmacist dosing)  Status:  Discontinued         Does not apply See admin instructions 12/03/23 0652 12/03/23 1204   12/03/23 0245  piperacillin -tazobactam (ZOSYN ) IVPB 3.375 g        3.375 g 100 mL/hr over 30 Minutes Intravenous  Once 12/03/23 0237 12/03/23 0338   12/03/23 0245  clindamycin  (CLEOCIN ) IVPB 900 mg        900 mg 100 mL/hr over 30 Minutes Intravenous  Once 12/03/23 0237 12/03/23 0338   12/03/23 0245  vancomycin  (VANCOCIN )  1,750 mg in sodium chloride  0.9 % 500 mL IVPB  Status:  Discontinued        1,750 mg 258.8 mL/hr over 120 Minutes Intravenous  Once 12/03/23 0241 12/03/23 0242   12/03/23 0245  vancomycin  (VANCOREADY) IVPB 1750 mg/350 mL        1,750 mg 175 mL/hr over 120 Minutes Intravenous Once 12/03/23 0243 12/03/23 0548         Component Value Date/Time   SDES TISSUE LEFT LEG 12/06/2023 1621   SPECREQUEST MEDIAL SAMPLE B, PT ON VANC 12/06/2023 1621   CULT  12/06/2023 1621    NO GROWTH 2 DAYS NO ANAEROBES ISOLATED; CULTURE IN PROGRESS FOR 5 DAYS Performed at [facility], [address], [place], [place] [address]    REPTSTATUS PENDING 12/06/2023 1621    Procedures: Procedure(s) (LRB): IRRIGATION AND DEBRIDEMENT LEFT LEG (Left) IRRIGATION AND DEBRIDEMENT ARM (Left) Medications reviewed:  Scheduled Meds:  methadone   25 mg Oral Daily   Continuous Infusions:  linezolid  (ZYVOX ) IV 600 mg (12/08/23 2042)   piperacillin -tazobactam (ZOSYN )  IV 3.375 g (12/09/23 0237)    Mennie LAMY, MD [facility] 12/09/2023, 8:51 AM

## 2023-12-09 NOTE — Progress Notes (Signed)
 Patient ID: Kelly [name], female   DOB: [birth year], 38 y.o.   MRN: [id number] Patient is a 38 year old woman status post debridement left leg and left arm for necrotizing fasciitis.  The cultures are pending.  The cellulitis in the left upper arm and left thigh has resolved.  There is 150 cc of new serosanguineous drainage in the wound VAC canister.  The VAC was not plugged in and was turned off.  Suction was reestablished.  Plan for return to the operating room on Wednesday.

## 2023-12-09 NOTE — Plan of Care (Signed)
   Problem: Education: Goal: Knowledge of General Education information will improve Description: Including pain rating scale, medication(s)/side effects and non-pharmacologic comfort measures Outcome: Progressing   Problem: Clinical Measurements: Goal: Will remain free from infection Outcome: Progressing   Problem: Activity: Goal: Risk for activity intolerance will decrease Outcome: Progressing   Problem: Nutrition: Goal: Adequate nutrition will be maintained Outcome: Progressing   Problem: Pain Managment: Goal: General experience of comfort will improve and/or be controlled Outcome: Progressing

## 2023-12-09 NOTE — Plan of Care (Signed)
 Id brief note   Left upper ext nec fasc S/p I&D -- more I&D planned 7/30   Cx group a strep   -change abx to linezolid  and pcn -once stable done with I&D can dc linezolid  -discussed with [facility]

## 2023-12-10 ENCOUNTER — Other Ambulatory Visit: Payer: Self-pay

## 2023-12-10 DIAGNOSIS — B950 Streptococcus, group A, as the cause of diseases classified elsewhere: Secondary | ICD-10-CM

## 2023-12-10 DIAGNOSIS — L03114 Cellulitis of left upper limb: Secondary | ICD-10-CM | POA: Diagnosis not present

## 2023-12-10 LAB — CBC
HCT: 24.9 % — ABNORMAL LOW (ref 36.0–46.0)
Hemoglobin: 7.8 g/dL — ABNORMAL LOW (ref 12.0–15.0)
MCH: 26.6 pg (ref 26.0–34.0)
MCHC: 31.3 g/dL (ref 30.0–36.0)
MCV: 85 fL (ref 80.0–100.0)
Platelets: 463 K/uL — ABNORMAL HIGH (ref 150–400)
RBC: 2.93 MIL/uL — ABNORMAL LOW (ref 3.87–5.11)
RDW: 15.1 % (ref 11.5–15.5)
WBC: 16 K/uL — ABNORMAL HIGH (ref 4.0–10.5)
nRBC: 0.3 % — ABNORMAL HIGH (ref 0.0–0.2)

## 2023-12-10 LAB — BASIC METABOLIC PANEL WITH GFR
Anion gap: 9 (ref 5–15)
BUN: 12 mg/dL (ref 6–20)
CO2: 25 mmol/L (ref 22–32)
Calcium: 8.7 mg/dL — ABNORMAL LOW (ref 8.9–10.3)
Chloride: 96 mmol/L — ABNORMAL LOW (ref 98–111)
Creatinine, Ser: 0.83 mg/dL (ref 0.44–1.00)
GFR, Estimated: >60 mL/min (ref >60)
Glucose, Bld: 144 mg/dL — ABNORMAL HIGH (ref 70–99)
Potassium: 4.5 mmol/L (ref 3.5–5.1)
Sodium: 130 mmol/L — ABNORMAL LOW (ref 135–145)

## 2023-12-10 LAB — URINE CYTOLOGY ANCILLARY ONLY
Chlamydia: NEGATIVE
Comment: NEGATIVE
Comment: NORMAL
Neisseria Gonorrhea: NEGATIVE

## 2023-12-10 LAB — MAGNESIUM: Magnesium: 1.9 mg/dL (ref 1.7–2.4)

## 2023-12-10 MED ORDER — LINEZOLID 600 MG PO TABS
600.0000 mg | ORAL_TABLET | Freq: Two times a day (BID) | ORAL | Status: DC
  Administered 2023-12-10 – 2023-12-11 (×2): 600 mg via ORAL
  Filled 2023-12-10 (×3): qty 1

## 2023-12-10 NOTE — Plan of Care (Signed)
  Problem: Health Behavior/Discharge Planning: Goal: Ability to manage health-related needs will improve Outcome: Progressing   Problem: Clinical Measurements: Goal: Ability to maintain clinical measurements within normal limits will improve Outcome: Progressing   Problem: Activity: Goal: Risk for activity intolerance will decrease Outcome: Progressing   Problem: Coping: Goal: Level of anxiety will decrease Outcome: Progressing   

## 2023-12-10 NOTE — Progress Notes (Signed)
 I medicated around 1 mg at our appointment 1 PROGRESS NOTE PERRIS TRIPATHI  FMW:[id number] DOB: 08-Oct-1985 DOA: 12/03/2023 PCP: Pcp, No  Brief Narrative/Hospital Course: 38 y.o. 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 difficile infection, bipolar disorder, hep C, hypertension, anemia presenting with complaints of rapidly progressive left leg erythema, pain, and swelling x 2 days PTA. In ED found to be septic, CT scan showed concerns of cellulitis of left lower extremity along with necrotizing fasciitis.she was started on broad-spectrum antibiotics and orthopedic consulted  S/P LLE I&D 7/22> Wound vac placed S/P LUE I&D 7/25>Wound vac placed.  Subjective: Patient seen and examined Resting comfortably, having ongoing pain Overnight patient remains afebrile BP stable  Labs reviewed from 7/28 with leukocytosis chronic anemia stable renal function  Assessment & Plan:   Severe sepsis POA  Left lower extremity necrotizing fasciitis  Left upper extremity abscess/necrotizing fasciitis IVDA hx: S/P LLE I&D 7/22> Wound vac placed>Dr Harden following and plan for final debridement on 7/30 S/P LUE I&D 7/25>Wound vac placed> not anticipating further surgery ID following closely, blood cultures since admission no growth. Cannot get PICC line due to IVDA/not a candidate for home IV antibiotic therapy. Patient wound growing group a strep > 7/28 changed to linezolid /pcn and planning to stop linezolid  once all surgeries done  Previously she was on methadone , methadone  has been started- uptitrate slowly , but limited due to prolonged QTc monitoring EKG-now at 457  Suspicious behavior: On 7/26 Patient with suspicious behavior found to have white powder and small white rock like substance and security has removed the  substance, cont tele safety sitter  Hep C positive: HIV negative, HCV RNA not detected  AKI with metabolic acidosis resolved.   Anemia of chronic disease: Some drop suspect due to patient's acute illness.  Monitor and transfuse if <7 g   Hyponatremia Hypokalemia Hypomagnesemia: Replaced.  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 Currently no signs of volume overload, appears to be compensated.  repeat TTE now > EF 70-75%, dissipate left ventricle with right ventricular pressure overload mildly reduced RV SF severely elevated pulmonary artery systolic pressure severely dilated RA massive tricuspid valve regurgitation with severely dilated TV annulus with known history of TV endocarditis.  Advised to follow-up with outpatient cardiology   Ongoing substance abuse/IV drug abuse TOC consulted for counseling.  Patient reports she has been snorting and has not recently used IV drugs.  Reports being on methadone  and asking if she can resume.  Pharmacy consulted slowly started methadone > uptitrate slowly.  DVT prophylaxis: SCDs Start: 12/03/23 0617 add chemical prophylaxis hold preop Code Status:   Code Status: Full Code Family Communication: plan of care discussed with patient at bedside. Patient status is: Remains hospitalized because of severity of illness Level of care: Med-Surg   Dispo: The patient is from: home            Anticipated disposition: TBD Objective: Vitals last 24 hrs: Vitals:   12/09/23 0621 12/09/23 1544 12/09/23 1944 12/10/23 0453  BP: (!) 141/89 128/76 129/82 122/87  Pulse:  80 81 80  Resp: 16 16 18 14   Temp: 98.6 F (37 C) 98 F (36.7 C) 98.3 F (36.8 C) (!) 97.4 F (36.3 C)  TempSrc: Oral Oral Oral Oral  SpO2: 96% 100% 97% 96%  Weight:  Height:        Physical Examination: General exam: AAOX3, Older than stated age HEENT:Oral mucosa moist, Ear/Nose WNL grossly Respiratory system: Bilaterally  clear BS,no use of accessory muscle Cardiovascular system: S1 & S2 +, No JVD. Gastrointestinal system: Abdomen soft,NT,ND, BS+ Nervous System: Alert, awake, moving all extremities,and following commands. Extremities:  lle and lue w/ dressing intact clean dry with VAC  Skin: No rashes,no icterus. MSK: Normal muscle bulk,tone, power     Data Reviewed: I have personally reviewed following labs and imaging studies ( see epic result tab) CBC: Recent Labs  Lab 12/04/23 0836 12/05/23 0604 12/06/23 0901 12/07/23 0419 12/09/23 0709  WBC 18.2* 18.0* 19.3* 14.9* 19.0*  HGB 9.3* 8.3* 8.3* 7.3* 7.5*  HCT 28.3* 26.4* 25.8* 23.2* 23.2*  MCV 82.7 84.1 83.5 84.7 82.9  PLT 191 209 272 287 397   CMP: Recent Labs  Lab 12/03/23 1108 12/04/23 0836 12/05/23 0604 12/07/23 0419 12/09/23 0709  NA 134* 130* 133* 133* 132*  K 3.0* 3.3* 3.9 5.1 4.6  CL 100 98 99 95* 96*  CO2 18* 21* 22 26 25   GLUCOSE 79 90 77 92 81  BUN 39* 22* 18 20 11   CREATININE 1.55* 0.91 0.79 0.89 0.79  CALCIUM  8.1* 8.2* 8.2* 8.4* 8.4*  MG 1.0* 1.7 1.5* 1.6* 1.8  PHOS  --  3.4  --   --   --    GFR: Estimated Creatinine Clearance: 99.7 mL/min (by C-G formula based on SCr of 0.79 mg/dL). No results for input(s): AST, ALT, ALKPHOS, BILITOT, PROT, ALBUMIN in the last 168 hours.  No results for input(s): LIPASE, AMYLASE in the last 168 hours. No results for input(s): AMMONIA in the last 168 hours. Coagulation Profile:  No results for input(s): INR, PROTIME in the last 168 hours.  Unresulted Labs (From admission, onward)     Start     Ordered   12/04/23 0500  Basic metabolic panel with GFR  Daily,   R      12/03/23 0908   12/04/23 0500  CBC  Daily,   R      12/03/23 0908   12/04/23 0500  Magnesium   Daily,   R      12/03/23 0908           Antimicrobials/Microbiology: Anti-infectives (From admission, onward)    Start     Dose/Rate Route Frequency Ordered Stop   12/09/23 1200  penicillin  G  potassium 24 Million Units in dextrose  5 % 500 mL CONTINUOUS infusion        24 Million Units 20.8 mL/hr over 24 Hours Intravenous Every 24 hours 12/09/23 1101     12/06/23 0800  ceFAZolin  (ANCEF ) IVPB 2g/100 mL premix  Status:  Discontinued        2 g 200 mL/hr over 30 Minutes Intravenous On call to O.R. 12/05/23 2149 12/06/23 1744   12/06/23 0800  vancomycin  (VANCOCIN ) IVPB 1000 mg/200 mL premix        1,000 mg 200 mL/hr over 60 Minutes Intravenous On call to O.R. 12/05/23 2149 12/06/23 1754   12/06/23 0600  ceFAZolin  (ANCEF ) IVPB 2g/100 mL premix  Status:  Discontinued        2 g 200 mL/hr over 30 Minutes Intravenous On call to O.R. 12/05/23 1132 12/05/23 1710   12/06/23 0600  vancomycin  (VANCOCIN ) IVPB 1000 mg/200 mL premix  Status:  Discontinued        1,000 mg 200 mL/hr over 60 Minutes Intravenous On call to  O.R. 12/05/23 1132 12/05/23 1710   12/03/23 1230  linezolid  (ZYVOX ) IVPB 600 mg        600 mg 300 mL/hr over 60 Minutes Intravenous Every 12 hours 12/03/23 1204     12/03/23 1100  clindamycin  (CLEOCIN ) IVPB 900 mg  Status:  Discontinued        900 mg 100 mL/hr over 30 Minutes Intravenous Every 8 hours 12/03/23 0619 12/03/23 1204   12/03/23 1000  piperacillin -tazobactam (ZOSYN ) IVPB 3.375 g  Status:  Discontinued        3.375 g 12.5 mL/hr over 240 Minutes Intravenous Every 8 hours 12/03/23 0652 12/09/23 1101   12/03/23 0652  vancomycin  variable dose per unstable renal function (pharmacist dosing)  Status:  Discontinued         Does not apply See admin instructions 12/03/23 0652 12/03/23 1204   12/03/23 0245  piperacillin -tazobactam (ZOSYN ) IVPB 3.375 g        3.375 g 100 mL/hr over 30 Minutes Intravenous  Once 12/03/23 0237 12/03/23 0338   12/03/23 0245  clindamycin  (CLEOCIN ) IVPB 900 mg        900 mg 100 mL/hr over 30 Minutes Intravenous  Once 12/03/23 0237 12/03/23 0338   12/03/23 0245  vancomycin  (VANCOCIN ) 1,750 mg in sodium chloride  0.9 % 500 mL IVPB  Status:   Discontinued        1,750 mg 258.8 mL/hr over 120 Minutes Intravenous  Once 12/03/23 0241 12/03/23 0242   12/03/23 0245  vancomycin  (VANCOREADY) IVPB 1750 mg/350 mL        1,750 mg 175 mL/hr over 120 Minutes Intravenous Once 12/03/23 0243 12/03/23 0548         Component Value Date/Time   SDES TISSUE LEFT LEG 12/06/2023 1621   SPECREQUEST MEDIAL SAMPLE B, PT ON VANC 12/06/2023 1621   CULT  12/06/2023 1621    NO GROWTH 4 DAYS NO ANAEROBES ISOLATED; CULTURE IN PROGRESS FOR 5 DAYS Performed at [facility], [address] [place], [place] [address]    REPTSTATUS PENDING 12/06/2023 1621    Procedures: Procedure(s) (LRB): IRRIGATION AND DEBRIDEMENT LEFT LEG (Left) IRRIGATION AND DEBRIDEMENT ARM (Left) Medications reviewed:  Scheduled Meds:  methadone   25 mg Oral Daily   Continuous Infusions:  linezolid  (ZYVOX ) IV 600 mg (12/10/23 0922)   penicillin  G potassium 24 Million Units in dextrose  5 % 500 mL CONTINUOUS infusion 24 Million Units (12/10/23 1046)    Mennie LAMY, MD [facility] 12/10/2023, 10:52 AM

## 2023-12-10 NOTE — Progress Notes (Signed)
 [facility]  Date of Admission:  12/03/2023     Reason for Follow Up: Necrotizing fascitis  Total days of antibiotics 9         ASSESSMENT:  Kelly [name] is a 38 y/o caucasian female with history of IVDU and disseminated MRSA infection and tricuspid valve endocarditis presenting with rapidly worsening edema, redness and pain of her left lower and upper extremities and found to have elevated LRINEC score concerning for necrotizing fasciitis s/p left upper and lower extremity debridement with culture growing Group A Streptococcus.   Kelly [name] is tolerating her antibiotics with no adverse side effects with plans to return to the OR tomorrow for final debridement. Discussed plan of care to continue with current dose of continuous Penicillin  G and linezolid . Post-operative wound care per Orthopedics. Remaining medical and supportive care per Internal Medicine.   PLAN:  Continue current dose of Penicillin  G and linezolid .  Post-operative wound care and additional debridement per Orthopedics.  Standard/universal precautions.  Remaining medical and supportive care per Internal Medicine.   Principal Problem:   Cellulitis Active Problems:   Substance abuse (HCC)   AKI (acute kidney injury) (HCC)   Severe sepsis (HCC)   Hypokalemia   Necrotizing fasciitis of lower leg (HCC)   Abscess of arm, left    linezolid   600 mg Oral Q12H   methadone   25 mg Oral Daily    SUBJECTIVE:  Afebrile overnight with no acute events. Left arm doing well, having pain in left leg. Tolerating antibiotics with no adverse side effects.   Allergies  Allergen Reactions   Hydrocodone  Itching   Morphine  And Codeine Nausea And Vomiting     Review of Systems: Review of Systems  Constitutional:  Negative for chills, fever and weight loss.  Respiratory:  Negative for cough, shortness of breath and wheezing.   Cardiovascular:  Negative for chest pain and leg swelling.   Gastrointestinal:  Negative for abdominal pain, constipation, diarrhea, nausea and vomiting.  Musculoskeletal:        Positive for left leg pain  Skin:  Negative for rash.      OBJECTIVE: Vitals:   12/09/23 1544 12/09/23 1944 12/10/23 0453 12/10/23 0830  BP: 128/76 129/82 122/87 (!) 142/87  Pulse: 80 81 80 79  Resp: 16 18 14 16   Temp: 98 F (36.7 C) 98.3 F (36.8 C) (!) 97.4 F (36.3 C) 98.3 F (36.8 C)  TempSrc: Oral Oral Oral Oral  SpO2: 100% 97% 96% 97%  Weight:      Height:       Body mass index is 25.84 kg/m.  Physical Exam Constitutional:      General: She is not in acute distress.    Appearance: She is well-developed.  Cardiovascular:     Rate and Rhythm: Normal rate and regular rhythm.     Heart sounds: Normal heart sounds.  Pulmonary:     Effort: Pulmonary effort is normal.     Breath sounds: Normal breath sounds.  Musculoskeletal:     Comments: Wound vacs in place with drainage in left leg canister and no drainage in left arm canister. Dressings in place, clean and dry.   Skin:    General: Skin is warm and dry.  Neurological:     Mental Status: She is alert.     Lab Results Lab Results  Component Value Date   WBC 16.0 (H) 12/10/2023   HGB 7.8 (L) 12/10/2023   HCT 24.9 (L) 12/10/2023  MCV 85.0 12/10/2023   PLT 463 (H) 12/10/2023    Lab Results  Component Value Date   CREATININE 0.83 12/10/2023   BUN 12 12/10/2023   NA 130 (L) 12/10/2023   K 4.5 12/10/2023   CL 96 (L) 12/10/2023   CO2 25 12/10/2023    Lab Results  Component Value Date   ALT 10 12/03/2023   AST 29 12/03/2023   ALKPHOS 121 12/03/2023   BILITOT 1.0 12/03/2023     Microbiology: Recent Results (from the past 240 hours)  Blood Culture (routine x 2)     Status: None   Collection Time: 12/03/23  2:56 AM   Specimen: BLOOD  Result Value Ref Range Status   Specimen Description BLOOD BLOOD LEFT ARM  Final   Special Requests   Final    BOTTLES DRAWN AEROBIC ONLY Blood  Culture results may not be optimal due to an inadequate volume of blood received in culture bottles   Culture   Final    NO GROWTH 5 DAYS Performed at [facility], [address], [place], [place] [address]    Report Status 12/08/2023 FINAL  Final  Blood Culture (routine x 2)     Status: None   Collection Time: 12/03/23  2:58 AM   Specimen: BLOOD  Result Value Ref Range Status   Specimen Description BLOOD BLOOD RIGHT ARM  Final   Special Requests   Final    BOTTLES DRAWN AEROBIC AND ANAEROBIC Blood Culture adequate volume   Culture   Final    NO GROWTH 5 DAYS Performed at [name], [address] [place], [place] [address]    Report Status 12/08/2023 FINAL  Final  Resp panel by RT-PCR (RSV, Flu A&B, Covid) Anterior Nasal Swab     Status: None   Collection Time: 12/03/23  3:50 AM   Specimen: Anterior Nasal Swab  Result Value Ref Range Status   SARS Coronavirus 2 by RT PCR NEGATIVE NEGATIVE Final   Influenza A by PCR NEGATIVE NEGATIVE Final   Influenza B by PCR NEGATIVE NEGATIVE Final    Comment: (NOTE) The Xpert Xpress SARS-CoV-2/FLU/RSV plus assay is intended as an aid in the diagnosis of influenza from Nasopharyngeal swab specimens and should not be used as a sole basis for treatment. Nasal washings and aspirates are unacceptable for Xpert Xpress SARS-CoV-2/FLU/RSV testing.  Fact Sheet for Patients: BloggerCourse.com  Fact Sheet for Healthcare Providers: SeriousBroker.it  This test is not yet approved or cleared by the [place]  FDA and has been authorized for detection and/or diagnosis of SARS-CoV-2 by FDA under an Emergency Use Authorization (EUA). This EUA will remain in effect (meaning this test can be used) for the duration of the COVID-19 declaration under Section 564(b)(1) of the Act, 21 U.S.C. section 360bbb-3(b)(1), unless the authorization is terminated or revoked.     Resp Syncytial Virus  by PCR NEGATIVE NEGATIVE Final    Comment: (NOTE) Fact Sheet for Patients: BloggerCourse.com  Fact Sheet for Healthcare Providers: SeriousBroker.it  This test is not yet approved or cleared by the [place]  FDA and has been authorized for detection and/or diagnosis of SARS-CoV-2 by FDA under an Emergency Use Authorization (EUA). This EUA will remain in effect (meaning this test can be used) for the duration of the COVID-19 declaration under Section 564(b)(1) of the Act, 21 U.S.C. section 360bbb-3(b)(1), unless the authorization is terminated or revoked.  Performed at [facility], [address] [place], [place] [address]  Surgical pcr screen     Status: Abnormal   Collection Time: 12/03/23  6:44 AM   Specimen: Nasal Mucosa; Nasal Swab  Result Value Ref Range Status   MRSA, PCR POSITIVE (A) NEGATIVE Final    Comment: RESULT CALLED TO, READ BACK BY AND VERIFIED WITH: RN HEATHER BOWMAN [phone] [id number] BY J RAZZAK, MT    Staphylococcus aureus POSITIVE (A) NEGATIVE Final    Comment: RESULT CALLED TO, READ BACK BY AND VERIFIED WITH: RN POWELL SINK [phone] BY J RAZZAK, MT (NOTE) The Xpert SA Assay (FDA approved for NASAL specimens in patients 91 years of age and older), is one component of a comprehensive surveillance program. It is not intended to diagnose infection nor to guide or monitor treatment. Performed at [facility], [address], [place], [place] [address]   Aerobic/Anaerobic Culture w Gram Stain (surgical/deep wound)     Status: None (Preliminary result)   Collection Time: 12/06/23  4:11 PM   Specimen: Soft Tissue, Other  Result Value Ref Range Status   Specimen Description TISSUE LEFT LEG  Final   Special Requests SAMPLE A, PT ON VANC  Final   Gram Stain   Final    FEW WBC PRESENT, PREDOMINANTLY PMN NO ORGANISMS SEEN Performed at [name], [address] [place], [place]  [address]    Culture   Final    RARE GROUP A STREP (S.PYOGENES) ISOLATED Beta hemolytic streptococci are predictably susceptible to penicillin  and other beta lactams. Susceptibility testing not routinely performed. NO ANAEROBES ISOLATED; CULTURE IN PROGRESS FOR 5 DAYS    Report Status PENDING  Incomplete  Aerobic/Anaerobic Culture w Gram Stain (surgical/deep wound)     Status: None (Preliminary result)   Collection Time: 12/06/23  4:21 PM   Specimen: Soft Tissue, Other  Result Value Ref Range Status   Specimen Description TISSUE LEFT LEG  Final   Special Requests MEDIAL SAMPLE B, PT ON VANC  Final   Gram Stain   Final    RARE WBC PRESENT, PREDOMINANTLY PMN NO ORGANISMS SEEN    Culture   Final    NO GROWTH 4 DAYS NO ANAEROBES ISOLATED; CULTURE IN PROGRESS FOR 5 DAYS Performed at [name], [address] [place], [place] [address]    Report Status PENDING  Incomplete     Cathlyn July, NP [facility]  12/10/2023  3:37 PM

## 2023-12-10 NOTE — H&P (View-Only) (Signed)
 Patient ID: Kelly [name], female   DOB: November 13, 1985, 38 y.o.   MRN: [id number] Patient is seen in follow-up status post serial debridement for necrotizing fasciitis left lower extremity and cellulitis left upper extremity.  Cultures from the left leg are showing group A strep.  There is 500 cc in the wound VAC canister.  Plan for return to the operating room for 1 final debridement tomorrow Wednesday.

## 2023-12-10 NOTE — Progress Notes (Signed)
 Patient ID: Kelly [name], female   DOB: November 13, 1985, 38 y.o.   MRN: [id number] Patient is seen in follow-up status post serial debridement for necrotizing fasciitis left lower extremity and cellulitis left upper extremity.  Cultures from the left leg are showing group A strep.  There is 500 cc in the wound VAC canister.  Plan for return to the operating room for 1 final debridement tomorrow Wednesday.

## 2023-12-11 ENCOUNTER — Inpatient Hospital Stay (HOSPITAL_COMMUNITY): Payer: MEDICAID

## 2023-12-11 ENCOUNTER — Encounter (HOSPITAL_COMMUNITY): Payer: Self-pay | Admitting: Internal Medicine

## 2023-12-11 ENCOUNTER — Encounter (HOSPITAL_COMMUNITY)
Admission: EM | Disposition: A | Discharge status: Home / Self Care | Payer: Self-pay | Source: Home / Self Care | Attending: Internal Medicine

## 2023-12-11 ENCOUNTER — Other Ambulatory Visit: Payer: Self-pay

## 2023-12-11 DIAGNOSIS — I5023 Acute on chronic systolic (congestive) heart failure: Secondary | ICD-10-CM

## 2023-12-11 DIAGNOSIS — L02416 Cutaneous abscess of left lower limb: Secondary | ICD-10-CM

## 2023-12-11 DIAGNOSIS — M726 Necrotizing fasciitis: Secondary | ICD-10-CM

## 2023-12-11 DIAGNOSIS — L03114 Cellulitis of left upper limb: Secondary | ICD-10-CM | POA: Diagnosis not present

## 2023-12-11 DIAGNOSIS — I110 Hypertensive heart disease with heart failure: Secondary | ICD-10-CM | POA: Diagnosis not present

## 2023-12-11 DIAGNOSIS — Z87891 Personal history of nicotine dependence: Secondary | ICD-10-CM

## 2023-12-11 DIAGNOSIS — L02612 Cutaneous abscess of left foot: Secondary | ICD-10-CM | POA: Diagnosis not present

## 2023-12-11 HISTORY — PX: INCISION AND DRAINAGE OF WOUND: SHX1803

## 2023-12-11 HISTORY — PX: APPLICATION OF WOUND VAC: SHX5189

## 2023-12-11 LAB — IRON AND TIBC
Iron: 45 ug/dL (ref 28–170)
Saturation Ratios: 9 % — ABNORMAL LOW (ref 10.4–31.8)
TIBC: 477 ug/dL — ABNORMAL HIGH (ref 250–450)
UIBC: 432 ug/dL

## 2023-12-11 LAB — AEROBIC/ANAEROBIC CULTURE W GRAM STAIN (SURGICAL/DEEP WOUND)
Culture: NO GROWTH
Culture: NO GROWTH

## 2023-12-11 LAB — FERRITIN: Ferritin: 48 ng/mL (ref 11–307)

## 2023-12-11 LAB — RETICULOCYTES
Immature Retic Fract: 36 % — ABNORMAL HIGH (ref 2.3–15.9)
RBC.: 3.09 MIL/uL — ABNORMAL LOW (ref 3.87–5.11)
Retic Count, Absolute: 93.9 K/uL (ref 19.0–186.0)
Retic Ct Pct: 3 % (ref 0.4–3.1)

## 2023-12-11 LAB — TYPE AND SCREEN
ABO/RH(D): O POS
Antibody Screen: NEGATIVE

## 2023-12-11 LAB — FOLATE: Folate: 11.4 ng/mL (ref >5.9)

## 2023-12-11 LAB — SURGICAL PCR SCREEN
MRSA, PCR: POSITIVE — AB
Staphylococcus aureus: POSITIVE — AB

## 2023-12-11 LAB — VITAMIN B12: Vitamin B-12: 1157 pg/mL — ABNORMAL HIGH (ref 180–914)

## 2023-12-11 SURGERY — IRRIGATION AND DEBRIDEMENT WOUND
Anesthesia: General | Site: Leg Lower | Laterality: Left

## 2023-12-11 MED ORDER — SODIUM CHLORIDE 0.9 % IV SOLN: INTRAVENOUS | Status: DC

## 2023-12-11 MED ORDER — HYDROMORPHONE HCL 1 MG/ML IJ SOLN
INTRAMUSCULAR | Status: AC
  Filled 2023-12-11: qty 1

## 2023-12-11 MED ORDER — LACTATED RINGERS IV SOLN: INTRAVENOUS | Status: DC

## 2023-12-11 MED ORDER — DEXAMETHASONE SODIUM PHOSPHATE 10 MG/ML IJ SOLN
INTRAMUSCULAR | Status: DC | PRN
  Administered 2023-12-11: 4 mg via INTRAVENOUS

## 2023-12-11 MED ORDER — METHADONE HCL 10 MG PO TABS
40.0000 mg | ORAL_TABLET | Freq: Every day | ORAL | Status: DC
  Administered 2023-12-12 – 2023-12-13 (×2): 40 mg via ORAL
  Filled 2023-12-11 (×2): qty 4

## 2023-12-11 MED ORDER — OXYCODONE HCL 5 MG PO TABS
5.0000 mg | ORAL_TABLET | Freq: Once | ORAL | Status: AC | PRN
  Administered 2023-12-11: 5 mg via ORAL

## 2023-12-11 MED ORDER — CHLORHEXIDINE GLUCONATE 4 % EX SOLN: 60.0000 mL | Freq: Once | CUTANEOUS | Status: DC

## 2023-12-11 MED ORDER — EPHEDRINE SULFATE-NACL 50-0.9 MG/10ML-% IV SOSY
PREFILLED_SYRINGE | INTRAVENOUS | Status: DC | PRN
  Administered 2023-12-11: 10 mg via INTRAVENOUS

## 2023-12-11 MED ORDER — CEFAZOLIN SODIUM-DEXTROSE 2-4 GM/100ML-% IV SOLN
2.0000 g | INTRAVENOUS | Status: AC
  Administered 2023-12-11: 2 g via INTRAVENOUS

## 2023-12-11 MED ORDER — METHADONE HCL 10 MG PO TABS
15.0000 mg | ORAL_TABLET | Freq: Once | ORAL | Status: AC
  Administered 2023-12-11: 15 mg via ORAL
  Filled 2023-12-11: qty 2

## 2023-12-11 MED ORDER — LINEZOLID 600 MG PO TABS
600.0000 mg | ORAL_TABLET | Freq: Two times a day (BID) | ORAL | Status: AC
  Administered 2023-12-11: 600 mg via ORAL
  Filled 2023-12-11: qty 1

## 2023-12-11 MED ORDER — ACETAMINOPHEN 10 MG/ML IV SOLN
1000.0000 mg | Freq: Once | INTRAVENOUS | Status: DC | PRN
  Administered 2023-12-11: 1000 mg via INTRAVENOUS

## 2023-12-11 MED ORDER — FENTANYL CITRATE (PF) 100 MCG/2ML IJ SOLN
INTRAMUSCULAR | Status: AC
  Filled 2023-12-11: qty 2

## 2023-12-11 MED ORDER — OXYCODONE HCL 5 MG PO TABS
ORAL_TABLET | ORAL | Status: AC
Start: 2023-12-11 — End: 2023-12-11
  Filled 2023-12-11: qty 1

## 2023-12-11 MED ORDER — MIDAZOLAM HCL 2 MG/2ML IJ SOLN
INTRAMUSCULAR | Status: AC
  Filled 2023-12-11: qty 2

## 2023-12-11 MED ORDER — FENTANYL CITRATE (PF) 250 MCG/5ML IJ SOLN
INTRAMUSCULAR | Status: DC | PRN
  Administered 2023-12-11: 50 ug via INTRAVENOUS
  Administered 2023-12-11 (×2): 100 ug via INTRAVENOUS

## 2023-12-11 MED ORDER — LIDOCAINE 2% (20 MG/ML) 5 ML SYRINGE
INTRAMUSCULAR | Status: DC | PRN
  Administered 2023-12-11: 100 mg via INTRAVENOUS

## 2023-12-11 MED ORDER — DOCUSATE SODIUM 100 MG PO CAPS
100.0000 mg | ORAL_CAPSULE | Freq: Two times a day (BID) | ORAL | Status: DC
  Administered 2023-12-11 – 2023-12-15 (×3): 100 mg via ORAL
  Filled 2023-12-11 (×7): qty 1

## 2023-12-11 MED ORDER — POVIDONE-IODINE 10 % EX SWAB: 2.0000 | Freq: Once | CUTANEOUS | Status: DC

## 2023-12-11 MED ORDER — VANCOMYCIN HCL 1250 MG/250ML IV SOLN: 1250.0000 mg | INTRAVENOUS | Status: DC

## 2023-12-11 MED ORDER — MIDAZOLAM HCL 2 MG/2ML IJ SOLN
INTRAMUSCULAR | Status: DC | PRN
  Administered 2023-12-11: 2 mg via INTRAVENOUS

## 2023-12-11 MED ORDER — PROPOFOL 10 MG/ML IV BOLUS
INTRAVENOUS | Status: AC
  Filled 2023-12-11: qty 20

## 2023-12-11 MED ORDER — DEXMEDETOMIDINE HCL IN NACL 80 MCG/20ML IV SOLN
INTRAVENOUS | Status: DC | PRN
  Administered 2023-12-11 (×2): 8 ug via INTRAVENOUS
  Administered 2023-12-11: 4 ug via INTRAVENOUS

## 2023-12-11 MED ORDER — PHENYLEPHRINE 80 MCG/ML (10ML) SYRINGE FOR IV PUSH (FOR BLOOD PRESSURE SUPPORT)
PREFILLED_SYRINGE | INTRAVENOUS | Status: DC | PRN
  Administered 2023-12-11: 240 ug via INTRAVENOUS
  Administered 2023-12-11: 160 ug via INTRAVENOUS

## 2023-12-11 MED ORDER — ORAL CARE MOUTH RINSE: 15.0000 mL | Freq: Once | OROMUCOSAL | Status: AC

## 2023-12-11 MED ORDER — ACETAMINOPHEN 10 MG/ML IV SOLN
INTRAVENOUS | Status: AC
Start: 2023-12-11 — End: 2023-12-11
  Filled 2023-12-11: qty 100

## 2023-12-11 MED ORDER — ONDANSETRON HCL 4 MG/2ML IJ SOLN
INTRAMUSCULAR | Status: DC | PRN
  Administered 2023-12-11: 4 mg via INTRAVENOUS

## 2023-12-11 MED ORDER — FENTANYL CITRATE (PF) 100 MCG/2ML IJ SOLN
25.0000 ug | INTRAMUSCULAR | Status: DC | PRN
  Administered 2023-12-11 (×3): 50 ug via INTRAVENOUS

## 2023-12-11 MED ORDER — HYDROMORPHONE HCL 1 MG/ML IJ SOLN
0.2500 mg | INTRAMUSCULAR | Status: DC | PRN
  Administered 2023-12-11 (×4): 0.5 mg via INTRAVENOUS

## 2023-12-11 MED ORDER — VANCOMYCIN HCL IN DEXTROSE 1-5 GM/200ML-% IV SOLN
INTRAVENOUS | Status: AC
  Administered 2023-12-11: 1000 mg
  Filled 2023-12-11: qty 200

## 2023-12-11 MED ORDER — PROPOFOL 10 MG/ML IV BOLUS
INTRAVENOUS | Status: DC | PRN
  Administered 2023-12-11: 200 mg via INTRAVENOUS

## 2023-12-11 MED ORDER — CEFAZOLIN SODIUM-DEXTROSE 2-4 GM/100ML-% IV SOLN
INTRAVENOUS | Status: AC
  Filled 2023-12-11: qty 100

## 2023-12-11 MED ORDER — CHLORHEXIDINE GLUCONATE 0.12 % MT SOLN: 15.0000 mL | Freq: Once | OROMUCOSAL | Status: AC

## 2023-12-11 MED ORDER — HYDROMORPHONE HCL 1 MG/ML IJ SOLN
INTRAMUSCULAR | Status: DC | PRN
  Administered 2023-12-11: 1 mg via INTRAVENOUS

## 2023-12-11 MED ORDER — OXYCODONE HCL 5 MG/5ML PO SOLN: 5.0000 mg | Freq: Once | ORAL | Status: AC | PRN

## 2023-12-11 MED ORDER — CHLORHEXIDINE GLUCONATE 0.12 % MT SOLN
OROMUCOSAL | Status: AC
  Administered 2023-12-11: 15 mL via OROMUCOSAL
  Filled 2023-12-11: qty 15

## 2023-12-11 MED ORDER — FENTANYL CITRATE (PF) 250 MCG/5ML IJ SOLN
INTRAMUSCULAR | Status: AC
Start: 2023-12-11 — End: 2023-12-11
  Filled 2023-12-11: qty 5

## 2023-12-11 MED ORDER — VASHE WOUND IRRIGATION OPTIME
TOPICAL | Status: DC | PRN
  Administered 2023-12-11 (×2): 34 [oz_av]

## 2023-12-11 SURGICAL SUPPLY — 43 items
BAG COUNTER SPONGE SURGICOUNT (BAG) IMPLANT
BLADE SURG 21 STRL SS (BLADE) ×1 IMPLANT
BNDG COHESIVE 4X5 TAN STRL LF (GAUZE/BANDAGES/DRESSINGS) IMPLANT
BNDG COHESIVE 6X5 TAN NS LF (GAUZE/BANDAGES/DRESSINGS) IMPLANT
BNDG COHESIVE 6X5 TAN ST LF (GAUZE/BANDAGES/DRESSINGS) IMPLANT
BNDG ELASTIC 6INX 5YD STR LF (GAUZE/BANDAGES/DRESSINGS) IMPLANT
BNDG GAUZE DERMACEA FLUFF 4 (GAUZE/BANDAGES/DRESSINGS) IMPLANT
CANISTER WOUNDNEG PRESSURE 500 (CANNISTER) IMPLANT
CLEANSER WND VASHE 34 (WOUND CARE) IMPLANT
CLEANSER WND VASHE INSTL 34OZ (WOUND CARE) IMPLANT
CONNECTOR Y WND VAC (MISCELLANEOUS) IMPLANT
COVER SURGICAL LIGHT HANDLE (MISCELLANEOUS) ×2 IMPLANT
DRAPE DERMATAC (DRAPES) IMPLANT
DRAPE INCISE IOBAN 66X45 STRL (DRAPES) IMPLANT
DRAPE U-SHAPE 47X51 STRL (DRAPES) ×1 IMPLANT
DRESSING PREVENA PLUS CUSTOM (GAUZE/BANDAGES/DRESSINGS) IMPLANT
DRESSING VERAFLO CLEANS CC MED (GAUZE/BANDAGES/DRESSINGS) IMPLANT
DRESSING VERAFLO CLEANSE CC (GAUZE/BANDAGES/DRESSINGS) IMPLANT
DRSG ADAPTIC 3X8 NADH LF (GAUZE/BANDAGES/DRESSINGS) ×1 IMPLANT
DRSG VAC PEEL AND PLACE LRG (GAUZE/BANDAGES/DRESSINGS) IMPLANT
DURAPREP 26ML APPLICATOR (WOUND CARE) ×1 IMPLANT
ELECTRODE REM PT RTRN 9FT ADLT (ELECTROSURGICAL) IMPLANT
GAUZE PAD ABD 8X10 STRL (GAUZE/BANDAGES/DRESSINGS) IMPLANT
GAUZE SPONGE 4X4 12PLY STRL (GAUZE/BANDAGES/DRESSINGS) IMPLANT
GLOVE BIOGEL PI IND STRL 9 (GLOVE) ×1 IMPLANT
GLOVE SURG ORTHO 9.0 STRL STRW (GLOVE) ×1 IMPLANT
GOWN STRL REUS W/ TWL XL LVL3 (GOWN DISPOSABLE) ×2 IMPLANT
GRAFT SKIN WND SURGICLOSE M95 (Tissue) IMPLANT
KIT BASIN OR (CUSTOM PROCEDURE TRAY) ×1 IMPLANT
KIT TURNOVER KIT B (KITS) ×1 IMPLANT
MANIFOLD NEPTUNE II (INSTRUMENTS) ×1 IMPLANT
NS IRRIG 1000ML POUR BTL (IV SOLUTION) ×1 IMPLANT
PACK ORTHO EXTREMITY (CUSTOM PROCEDURE TRAY) ×1 IMPLANT
PAD ARMBOARD POSITIONER FOAM (MISCELLANEOUS) ×2 IMPLANT
PAD NEG PRESSURE SENSATRAC (MISCELLANEOUS) IMPLANT
SET HNDPC FAN SPRY TIP SCT (DISPOSABLE) IMPLANT
STOCKINETTE IMPERVIOUS 9X36 MD (GAUZE/BANDAGES/DRESSINGS) IMPLANT
SUT ETHILON 2 0 PSLX (SUTURE) ×1 IMPLANT
SWAB COLLECTION DEVICE MRSA (MISCELLANEOUS) ×1 IMPLANT
SWAB CULTURE ESWAB REG 1ML (MISCELLANEOUS) IMPLANT
TOWEL GREEN STERILE (TOWEL DISPOSABLE) ×1 IMPLANT
TUBE CONNECTING 12X1/4 (SUCTIONS) ×1 IMPLANT
YANKAUER SUCT BULB TIP NO VENT (SUCTIONS) ×1 IMPLANT

## 2023-12-11 NOTE — Progress Notes (Signed)
 I medicated around 1 mg at our appointment 1 PROGRESS NOTE Kelly [name]  FMW:[id number] DOB: [birth year] DOA: 12/03/2023 PCP: Pcp, No  Brief Narrative/Hospital Course: 38 y.o. 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 difficile infection, bipolar disorder, hep C, hypertension, anemia presenting with complaints of rapidly progressive left leg erythema, pain, and swelling x 2 days PTA. In ED found to be septic, CT scan showed concerns of cellulitis of left lower extremity along with necrotizing fasciitis.she was started on broad-spectrum antibiotics and orthopedic consulted  S/P LLE I&D 7/22> Wound vac placed S/P LUE I&D 7/25>Wound vac placed.  Subjective: Patient seen examined preop holding room Having pain in the left leg and left arm Overnight events afebrile BP stable  Went for surgical debridement today  Assessment & Plan:   Severe sepsis POA  Left lower extremity necrotizing fasciitis  Left upper extremity abscess/necrotizing fasciitis IVDA hx: S/P LLE I&D 7/22> Wound vac placed>Dr Harden following and for final debridement on 7/30 S/P LUE I&D 7/25>Wound vac placed> not anticipating further surgery ID following closely, blood cultures since admission no growth. Cannot get PICC line due to IVDA/not a candidate for home IV antibiotic therapy. Patient wound growing group a strep > 7/28 changed to linezolid /pcn and planning to stop linezolid  once all surgeries done  Previously she was on methadone , methadone  has been started- uptitrate slowly , but limited due to prolonged QTc monitoring EKG-now at 457  Suspicious behavior: On 7/26 Patient with suspicious behavior found to have white powder and small white rock like substance and security has removed the substance, cont tele safety sitter  Hep C  positive: HIV negative, HCV RNA not detected  AKI with metabolic acidosis resolved.   Anemia of chronic disease: Some drop suspect due to patient's acute illness.  Monitor and transfuse if <7 g   Hyponatremia Hypokalemia Hypomagnesemia: Replaced.  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 Currently no signs of volume overload, appears to be compensated.  repeat TTE now > EF 70-75%, dissipate left ventricle with right ventricular pressure overload mildly reduced RV SF severely elevated pulmonary artery systolic pressure severely dilated RA massive tricuspid valve regurgitation with severely dilated TV annulus with known history of TV endocarditis.  Advised to follow-up with outpatient cardiology   Ongoing substance abuse/IV drug abuse TOC consulted for counseling.  Patient reports she has been snorting and has not recently used IV drugs.  Reports being on methadone  and asking if she can resume.  Pharmacy consulted slowly started methadone > uptitrate slowly.  DVT prophylaxis: SCDs Start: 12/03/23 0617 add chemical prophylaxis hold preop Code Status:   Code Status: Full Code Family Communication: plan of care discussed with patient at bedside. Patient status is: Remains hospitalized because of severity of illness Level of care: Med-Surg   Dispo: The patient is from: home            Anticipated disposition: TBD Objective: Vitals last 24 hrs: Vitals:   12/10/23 0830 12/10/23 1542 12/10/23 1938 12/11/23 0435  BP: (!) 142/87 125/85 127/76 138/88  Pulse: 79 84 77 75  Resp: 16 16 17 17   Temp: 98.3 F (36.8 C)  97.7 F (36.5 C) 97.6 F (36.4 C)  TempSrc: Oral  Oral Oral  SpO2: 97% 97% 97% 97%  Weight:      Height:  Physical Examination: General exam: alert awake, oriented at baseline, 38 years old, HEENT: HEENT:Oral mucosa moist, Ear/Nose WNL grossly Respiratory system: Bilaterally clear BS,no use of accessory  muscle Cardiovascular system: S1 & S2 +, No JVD. Gastrointestinal system: Abdomen soft,NT,ND, BS+ Nervous System: Alert, awake, moving all extremities,and following commands. Extremities: LUE LLE dressing clean dry intact with wound VAC  Skin: No rashes,no icterus. MSK: Normal muscle bulk,tone, power      Data Reviewed: I have personally reviewed following labs and imaging studies ( see epic result tab) CBC: Recent Labs  Lab 12/05/23 0604 12/06/23 0901 12/07/23 0419 12/09/23 0709 12/10/23 1011  WBC 18.0* 19.3* 14.9* 19.0* 16.0*  HGB 8.3* 8.3* 7.3* 7.5* 7.8*  HCT 26.4* 25.8* 23.2* 23.2* 24.9*  MCV 84.1 83.5 84.7 82.9 85.0  PLT 209 272 287 397 463*   CMP: Recent Labs  Lab 12/05/23 0604 12/07/23 0419 12/09/23 0709 12/10/23 1011  NA 133* 133* 132* 130*  K 3.9 5.1 4.6 4.5  CL 99 95* 96* 96*  CO2 22 26 25 25   GLUCOSE 77 92 81 144*  BUN 18 20 11 12   CREATININE 0.79 0.89 0.79 0.83  CALCIUM  8.2* 8.4* 8.4* 8.7*  MG 1.5* 1.6* 1.8 1.9   GFR: Estimated Creatinine Clearance: 96.1 mL/min (by C-G formula based on SCr of 0.83 mg/dL). No results for input(s): AST, ALT, ALKPHOS, BILITOT, PROT, ALBUMIN in the last 168 hours.  No results for input(s): LIPASE, AMYLASE in the last 168 hours. No results for input(s): AMMONIA in the last 168 hours. Coagulation Profile:  No results for input(s): INR, PROTIME in the last 168 hours.  Unresulted Labs (From admission, onward)     Start     Ordered   12/11/23 0500  Vitamin B12  (Anemia Panel (PNL))  Tomorrow morning,   R       Question:  Specimen collection method  Answer:  Lab=Lab collect   12/10/23 1339   12/11/23 0500  Folate  (Anemia Panel (PNL))  Tomorrow morning,   R       Question:  Specimen collection method  Answer:  Lab=Lab collect   12/10/23 1339   12/11/23 0500  Iron  and TIBC  (Anemia Panel (PNL))  Tomorrow morning,   R       Question:  Specimen collection method  Answer:  Lab=Lab collect   12/10/23 1339    12/11/23 0500  Ferritin  (Anemia Panel (PNL))  Tomorrow morning,   R       Question:  Specimen collection method  Answer:  Lab=Lab collect   12/10/23 1339   12/11/23 0500  Reticulocytes  (Anemia Panel (PNL))  Tomorrow morning,   R       Question:  Specimen collection method  Answer:  Lab=Lab collect   12/10/23 1339           Antimicrobials/Microbiology: Anti-infectives (From admission, onward)    Start     Dose/Rate Route Frequency Ordered Stop   12/11/23 0745  ceFAZolin  (ANCEF ) IVPB 2g/100 mL premix        2 g 200 mL/hr over 30 Minutes Intravenous On call to O.R. 12/11/23 0743 12/11/23 0914   12/11/23 0745  vancomycin  (VANCOREADY) IVPB 1250 mg/250 mL        1,250 mg 166.7 mL/hr over 90 Minutes Intravenous On call to O.R. 12/11/23 0743 12/12/23 0559   12/11/23 0705  ceFAZolin  (ANCEF ) 2-4 GM/100ML-% IVPB       Note to Pharmacy: Carolynn Friday E: cabinet override  12/11/23 0705 12/11/23 0918   12/11/23 0705  vancomycin  (VANCOCIN ) 1-5 GM/200ML-% IVPB       Note to Pharmacy: Carolynn Friday E: cabinet override      12/11/23 0705 12/11/23 0805   12/10/23 2200  [MAR Hold]  linezolid  (ZYVOX ) tablet 600 mg        (MAR Hold since Wed 12/11/2023 at 0731.Hold Reason: Transfer to a Procedural area)   600 mg Oral Every 12 hours 12/10/23 1107     12/09/23 1200  [MAR Hold]  penicillin  G potassium 24 Million Units in dextrose  5 % 500 mL CONTINUOUS infusion        (MAR Hold since Wed 12/11/2023 at 0731.Hold Reason: Transfer to a Procedural area)   24 Million Units 20.8 mL/hr over 24 Hours Intravenous Every 24 hours 12/09/23 1101     12/06/23 0800  ceFAZolin  (ANCEF ) IVPB 2g/100 mL premix  Status:  Discontinued        2 g 200 mL/hr over 30 Minutes Intravenous On call to O.R. 12/05/23 2149 12/06/23 1744   12/06/23 0800  vancomycin  (VANCOCIN ) IVPB 1000 mg/200 mL premix        1,000 mg 200 mL/hr over 60 Minutes Intravenous On call to O.R. 12/05/23 2149 12/06/23 1754   12/06/23 0600  ceFAZolin   (ANCEF ) IVPB 2g/100 mL premix  Status:  Discontinued        2 g 200 mL/hr over 30 Minutes Intravenous On call to O.R. 12/05/23 1132 12/05/23 1710   12/06/23 0600  vancomycin  (VANCOCIN ) IVPB 1000 mg/200 mL premix  Status:  Discontinued        1,000 mg 200 mL/hr over 60 Minutes Intravenous On call to O.R. 12/05/23 1132 12/05/23 1710   12/03/23 1230  linezolid  (ZYVOX ) IVPB 600 mg  Status:  Discontinued        600 mg 300 mL/hr over 60 Minutes Intravenous Every 12 hours 12/03/23 1204 12/10/23 1107   12/03/23 1100  clindamycin  (CLEOCIN ) IVPB 900 mg  Status:  Discontinued        900 mg 100 mL/hr over 30 Minutes Intravenous Every 8 hours 12/03/23 0619 12/03/23 1204   12/03/23 1000  piperacillin -tazobactam (ZOSYN ) IVPB 3.375 g  Status:  Discontinued        3.375 g 12.5 mL/hr over 240 Minutes Intravenous Every 8 hours 12/03/23 0652 12/09/23 1101   12/03/23 0652  vancomycin  variable dose per unstable renal function (pharmacist dosing)  Status:  Discontinued         Does not apply See admin instructions 12/03/23 0652 12/03/23 1204   12/03/23 0245  piperacillin -tazobactam (ZOSYN ) IVPB 3.375 g        3.375 g 100 mL/hr over 30 Minutes Intravenous  Once 12/03/23 0237 12/03/23 0338   12/03/23 0245  clindamycin  (CLEOCIN ) IVPB 900 mg        900 mg 100 mL/hr over 30 Minutes Intravenous  Once 12/03/23 0237 12/03/23 0338   12/03/23 0245  vancomycin  (VANCOCIN ) 1,750 mg in sodium chloride  0.9 % 500 mL IVPB  Status:  Discontinued        1,750 mg 258.8 mL/hr over 120 Minutes Intravenous  Once 12/03/23 0241 12/03/23 0242   12/03/23 0245  vancomycin  (VANCOREADY) IVPB 1750 mg/350 mL        1,750 mg 175 mL/hr over 120 Minutes Intravenous Once 12/03/23 0243 12/03/23 0548         Component Value Date/Time   SDES TISSUE LEFT LEG 12/06/2023 1621   SPECREQUEST MEDIAL SAMPLE B, PT ON VANC  12/06/2023 1621   CULT  12/06/2023 1621    No growth aerobically or anaerobically. Performed at [facility], [address], [place], [place] [address]    REPTSTATUS 12/11/2023 FINAL 12/06/2023 1621    Procedures: Procedure(s) (LRB): IRRIGATION AND DEBRIDEMENT WOUND (Left) Medications reviewed:  Scheduled Meds:  chlorhexidine   60 mL Topical Once   [MAR Hold] linezolid   600 mg Oral Q12H   [MAR Hold] methadone   25 mg Oral Daily   povidone-iodine   2 Application Topical Once   Continuous Infusions:  lactated ringers      [MAR Hold] penicillin  G potassium 24 Million Units in dextrose  5 % 500 mL CONTINUOUS infusion 24 Million Units (12/10/23 1046)   vancomycin       Mennie LAMY, MD [facility] 12/11/2023, 9:37 AM

## 2023-12-11 NOTE — Transfer of Care (Signed)
 Immediate Anesthesia Transfer of Care Note  Patient: Kelly [name]  Procedure(s) Performed: IRRIGATION AND DEBRIDEMENT WOUND (Left: Leg Lower) APPLICATION, WOUND VAC (Left: Leg Lower)  Patient Location: PACU  Anesthesia Type:General  Level of Consciousness: awake, alert , and oriented  Airway & Oxygen Therapy: Patient Spontanous Breathing and Patient connected to nasal cannula oxygen  Post-op Assessment: Report given to RN and Post -op Vital signs reviewed and stable  Post vital signs: Reviewed and stable  Last Vitals:  Vitals Value Taken Time  BP 153/110 12/11/23 10:15  Temp 36.9 C 12/11/23 10:15  Pulse 94 12/11/23 10:19  Resp 17 12/11/23 10:19  SpO2 98 % 12/11/23 10:19  Vitals shown include unfiled device data.  Last Pain:  Vitals:   12/11/23 0802  TempSrc: Oral  PainSc: 8       Patients Stated Pain Goal: 0 (12/08/23 0743)  Complications: No notable events documented.

## 2023-12-11 NOTE — Op Note (Signed)
 12/11/2023  10:13 AM  PATIENT:  Kelly [name]    PRE-OPERATIVE DIAGNOSIS:  necrotizing fasciits left foot left leg  POST-OPERATIVE DIAGNOSIS:  Same  PROCEDURE: Excisional debridement left leg medially with excision skin soft tissue muscle and fascia. Excisional debridement lateral left leg with excision of skin soft tissue muscle and fascia. Excisional debridement dorsum left foot with excision of skin soft tissue muscle and fascia. Local tissue transfer for wound closure of the medial incision 40 x 10 cm. Application of Kerecis micro graft to the lateral wound that is 40 x 10 cm. Application Kerecis micro graft to the dorsal foot wound that is 10 x 10 cm. Application Kerecis micro graft to the medial calf wound that is 40 x 5 cm. Application of cleanse choice wound VAC sponges x 2. Application of the Prevena peel in place wound VAC sponges x 2.  SURGEON:  Jerona LULLA Sage, MD  PHYSICIAN ASSISTANT:None ANESTHESIA:   General  PREOPERATIVE INDICATIONS:  Kelly [name] is a  38 y.o. female with a diagnosis of necrotizing fasciits who failed conservative measures and elected for surgical management.    The risks benefits and alternatives were discussed with the patient preoperatively including but not limited to the risks of infection, bleeding, nerve injury, cardiopulmonary complications, the need for revision surgery, among others, and the patient was willing to proceed.  OPERATIVE IMPLANTS:   Implant Name Type Inv. Item Serial No. Manufacturer Lot No. LRB No. Used Action  GRAFT SKIN WND SURGICLOSE M95 - [id number] Tissue GRAFT SKIN WND [place]  [employer] [id number] Left 1 Implanted  GRAFT SKIN WND SURGICLOSE M95 - [id number] Tissue GRAFT SKIN WND [place]  [employer] [id number] Left 1 Implanted  GRAFT SKIN WND SURGICLOSE M95 - [id number] Tissue GRAFT SKIN WND SURGICLOSE M95  [employer] [id number] Left 1 Implanted    @ENCIMAGES @  OPERATIVE FINDINGS:  Muscle had good color and contractility no signs of further necrosis.  OPERATIVE PROCEDURE: Patient brought the operating room underwent a general anesthetic.  After adequate was anesthesia obtained patient left lower extremity was prepped using DuraPrep draped into a sterile field a timeout was called.  A 21 blade knife and rondure was used to excise skin soft tissue muscle and fascia from the medial calf wound lateral calf wound and dorsum of the left foot.  There was healthy viable tissue.  The wound bed was cleansed with Vashe x 2.  95 cm of Kerecis micro graft was used to cover the wound bed of the medial calf wound and 95 cm was used to cover the lateral calf wound and 95 cm was used to cover the dorsum of the foot wound.  The medial calf wound was 40 x 5 cm.  The lateral calf wound was 40 x 10 cm and the dorsal foot wound was 10 x 10 cm.  Local tissue transfer was used to close the medial calf wound that was 40 x 5 cm with 2-0 nylon.  The tissue margins were undermined to allow for advancement.  The cleanse choice wound VAC sponge was then applied to the wound bed over the lateral and dorsum of the foot.  This was then covered with the peel in place dressing secured with derma tack 2 suction sponges were used this had a good suction fit patient was extubated taken to PACU in stable condition.  The dressing for the left arm was also changed and the wound edges were well-approximated no signs of infection no redness  no cellulitis.   DISCHARGE PLANNING:  Antibiotic duration: Continue IV antibiotics  Weightbearing: Weightbearing as tolerated  Pain medication: Opioid pathway  Dressing care/ Wound VAC: Continue wound VAC  Ambulatory devices: Walker  Discharge to: Discharge planning based on therapy recommendations.  No further surgical intervention anticipated at this time.  Follow-up: In the office 1 week post operative.

## 2023-12-11 NOTE — Anesthesia Procedure Notes (Signed)
 Procedure Name: LMA Insertion Date/Time: 12/11/2023 9:16 AM  Performed by: Lockie Flesher, CRNAPre-anesthesia Checklist: Patient identified, Emergency Drugs available, Suction available and Patient being monitored Patient Re-evaluated:Patient Re-evaluated prior to induction Oxygen Delivery Method: Circle System Utilized Preoxygenation: Pre-oxygenation with 100% oxygen Induction Type: IV induction Ventilation: Mask ventilation without difficulty LMA: LMA inserted LMA Size: 4.0 Number of attempts: 1 Airway Equipment and Method: Bite block Placement Confirmation: positive ETCO2 Tube secured with: Tape Dental Injury: Teeth and Oropharynx as per pre-operative assessment

## 2023-12-11 NOTE — Anesthesia Postprocedure Evaluation (Signed)
 Anesthesia Post Note  Patient: Kelly [name]  Procedure(s) Performed: IRRIGATION AND DEBRIDEMENT WOUND (Left: Leg Lower) APPLICATION, WOUND VAC (Left: Leg Lower)     Patient location during evaluation: PACU Anesthesia Type: General Level of consciousness: awake and alert Pain management: pain level controlled Vital Signs Assessment: post-procedure vital signs reviewed and stable Respiratory status: spontaneous breathing, nonlabored ventilation, respiratory function stable and patient connected to nasal cannula oxygen Cardiovascular status: blood pressure returned to baseline and stable Postop Assessment: no apparent nausea or vomiting Anesthetic complications: no   No notable events documented.  Last Vitals:  Vitals:   12/11/23 1130 12/11/23 1148  BP: 123/87 (!) 126/90  Pulse: 75 71  Resp: 13 16  Temp: 36.6 C   SpO2: 94% 94%    Last Pain:  Vitals:   12/11/23 1435  TempSrc:   PainSc: 10-Worst pain ever                 Seini Lannom S

## 2023-12-11 NOTE — Interval H&P Note (Signed)
 History and Physical Interval Note:  12/11/2023 6:40 AM  Kelly [name]  has presented today for surgery, with the diagnosis of necrotizing fasciits.  The various methods of treatment have been discussed with the patient and family. After consideration of risks, benefits and other options for treatment, the patient has consented to  Procedure(s) with comments: IRRIGATION AND DEBRIDEMENT WOUND (Left) - LEFT LEG IRRIGATION & DEBRIDEMENT as a surgical intervention.  The patient's history has been reviewed, patient examined, no change in status, stable for surgery.  I have reviewed the patient's chart and labs.  Questions were answered to the patient's satisfaction.     Kelly [name]

## 2023-12-11 NOTE — Anesthesia Preprocedure Evaluation (Signed)
 Anesthesia Evaluation  Patient identified by MRN, date of birth, ID band Patient awake    Reviewed: Allergy & Precautions, H&P , NPO status , Patient's Chart, lab work & pertinent test results  History of Anesthesia Complications Negative for: history of anesthetic complications  Airway Mallampati: II  TM Distance: >3 FB Neck ROM: Full    Dental  (+) Dental Advisory Given, Poor Dentition   Pulmonary neg pulmonary ROS, former smoker   Pulmonary exam normal breath sounds clear to auscultation       Cardiovascular hypertension, pulmonary hypertension+CHF   Rhythm:Regular Rate:Normal + Systolic murmurs 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 Left ventricular ejection fraction, by estimation, is 70 to 75%. The  left ventricle has hyperdynamic function. The left ventricle has no  regional wall motion abnormalities. Left ventricular diastolic parameters  were normal. There is the  interventricular septum is flattened in diastole ('D' shaped left  ventricle), consistent with right ventricular volume overload and the  interventricular septum is flattened in systole, consistent with right  ventricular pressure overload.   2. Right ventricular systolic function is mildly reduced. The right  ventricular size is severely enlarged. There is severely elevated  pulmonary artery systolic pressure.   3. Right atrial size was severely dilated.   4. The mitral valve is normal in structure. No evidence of mitral valve  regurgitation. No evidence of mitral stenosis.   5. TV annulus severely dilated with malcoaptation of the leaflets due to  known history of TV endocarditis. The tricuspid valve is abnormal.  Tricuspid valve regurgitation is massive.   6. The aortic valve is normal in structure. Aortic valve regurgitation is  not visualized. No aortic stenosis is present.   7. The inferior vena cava is  normal in size with greater than 50%  respiratory variability, suggesting right atrial pressure of 3 mmHg.      Neuro/Psych  PSYCHIATRIC DISORDERS Anxiety Depression Bipolar Disorder   negative neurological ROS     GI/Hepatic negative GI ROS,,,(+)     substance abuse  , Hepatitis -, C  Endo/Other  negative endocrine ROS    Renal/GU Renal diseaseAKI with metabolic acidosis     Musculoskeletal negative musculoskeletal ROS (+)  Left upper arm forearm hand cellulitis Necrotizing fasciitis of left lower extremity with cellulitis   Abdominal   Peds  Hematology  (+) Blood dyscrasia, anemia   Anesthesia Other Findings Day of surgery medications reviewed with the patient.  Reproductive/Obstetrics                              Anesthesia Physical Anesthesia Plan  ASA: 4  Anesthesia Plan: General   Post-op Pain Management:    Induction: Intravenous  PONV Risk Score and Plan: 3 and Dexamethasone , Ondansetron  and Midazolam   Airway Management Planned: Oral ETT  Additional Equipment:   Intra-op Plan:   Post-operative Plan: Extubation in OR  Informed Consent: I have reviewed the patients History and Physical, chart, labs and discussed the procedure including the risks, benefits and alternatives for the proposed anesthesia with the patient or authorized representative who has indicated his/her understanding and acceptance.     Dental advisory given  Plan Discussed with: CRNA  Anesthesia Plan Comments:          Anesthesia Quick Evaluation

## 2023-12-12 ENCOUNTER — Encounter (HOSPITAL_COMMUNITY): Payer: Self-pay | Admitting: Orthopedic Surgery

## 2023-12-12 DIAGNOSIS — L03114 Cellulitis of left upper limb: Secondary | ICD-10-CM | POA: Diagnosis not present

## 2023-12-12 LAB — BASIC METABOLIC PANEL WITH GFR
Anion gap: 13 (ref 5–15)
BUN: 18 mg/dL (ref 6–20)
CO2: 22 mmol/L (ref 22–32)
Calcium: 8.5 mg/dL — ABNORMAL LOW (ref 8.9–10.3)
Chloride: 99 mmol/L (ref 98–111)
Creatinine, Ser: 0.8 mg/dL (ref 0.44–1.00)
GFR, Estimated: >60 mL/min (ref >60)
Glucose, Bld: 110 mg/dL — ABNORMAL HIGH (ref 70–99)
Potassium: 4.6 mmol/L (ref 3.5–5.1)
Sodium: 134 mmol/L — ABNORMAL LOW (ref 135–145)

## 2023-12-12 MED ORDER — ENOXAPARIN SODIUM 40 MG/0.4ML IJ SOSY
40.0000 mg | PREFILLED_SYRINGE | INTRAMUSCULAR | Status: DC
  Administered 2023-12-12 – 2023-12-14 (×2): 40 mg via SUBCUTANEOUS
  Filled 2023-12-12 (×2): qty 0.4

## 2023-12-12 NOTE — Plan of Care (Signed)
  Problem: Acute Rehab PT Goals(only PT should resolve) Goal: Patient Will Transfer Sit To/From Stand 12/12/2023 1613 by Josepha Cassis, Student-PT Flowsheets (Taken 12/12/2023 1613) Patient will transfer sit to/from stand: with modified independence 12/12/2023 1609 by Josepha Cassis, Student-PT Outcome: Progressing Goal: Pt Will Transfer Bed To Chair/Chair To Bed 12/12/2023 1613 by Josepha Cassis, Student-PT Flowsheets (Taken 12/12/2023 1613) Pt will Transfer Bed to Chair/Chair to Bed: with modified independence 12/12/2023 1609 by Josepha Cassis, Student-PT Outcome: Progressing Goal: Pt Will Ambulate 12/12/2023 1613 by Josepha Cassis, Student-PT Flowsheets (Taken 12/12/2023 1613) Pt will Ambulate:  > 125 feet  with modified independence  with least restrictive assistive device 12/12/2023 1612 by Josepha Cassis, Student-PT Outcome: Progressing

## 2023-12-12 NOTE — Evaluation (Signed)
 Physical Therapy Evaluation Patient Details Name: Kelly [name] MRN: [id number] DOB: May 26, 1985 Today's Date: 12/12/2023  History of Present Illness  38 y.o. female presents to [facility] hospital on 12/03/23 with LUE and LLE redness, pain, and swelling, concern for necrotizing fasciitis. Underwent debridement and wound vac placement 7/22. Underwent fasciotomy and I&D of L hand and forearm 7/24 for same thing. F/U I&D on 7/25, 7/30. PMH:  HTN, systolic CHF with moderate pulmonary hypertension and severe TR, polysubstance abuse including history of IV drug use, recurrent history of endocarditis, C. difficile colitis, MRSA bacteremia, sepsis, bipolar do  Clinical Impression  Pt is in bed at the start of the session and is agreeable to therapy. Educated pt that she can WBAT on LLE. Pt demonstrates that she is able to step pivot to and from the [facility] to her bed but is unsteady due to the pain in her LLE. OT consult placed to address LUE. Pt is expected to progress her functional mobility when her wound and pain are better. PT to follow acutely.       If plan is discharge home, recommend the following: A little help with bathing/dressing/bathroom;Assistance with cooking/housework;Assist for transportation;A little help with walking and/or transfers   Can travel by private vehicle        Equipment Recommendations Rolling walker (2 wheels);Wheelchair (measurements PT);Wheelchair cushion (measurements PT)  Recommendations for Other Services       Functional Status Assessment Patient has had a recent decline in their functional status and demonstrates the ability to make significant improvements in function in a reasonable and predictable amount of time.     Precautions / Restrictions Precautions Precautions: Fall;Other (comment) Recall of Precautions/Restrictions: Intact Precaution/Restrictions Comments: Wound Vac on LLE Restrictions Weight Bearing Restrictions Per Provider Order: No       Mobility  Bed Mobility Overal bed mobility: Needs Assistance Bed Mobility: Supine to Sit     Supine to sit: Contact guard     General bed mobility comments: For safety    Transfers Overall transfer level: Needs assistance Equipment used: Rolling walker (2 wheels) Transfers: Bed to chair/wheelchair/BSC, Sit to/from Stand Sit to Stand: Min assist   Step pivot transfers: Min assist       General transfer comment: STSx2. Assist for steadying when rising and for stepping. Pt unsteady when transferring to and from the [facility]    Ambulation/Gait                  Stairs            Wheelchair Mobility     Tilt Bed    Modified Rankin (Stroke Patients Only)       Balance Overall balance assessment: Needs assistance Sitting-balance support: Feet supported, Single extremity supported Sitting balance-Leahy Scale: Good Sitting balance - Comments: Able to sit EOB w/ no LOB   Standing balance support: Reliant on assistive device for balance, During functional activity, Bilateral upper extremity supported Standing balance-Leahy Scale: Fair Standing balance comment: Requires RW to stand and to offload LLE due to pain in leg                             Pertinent Vitals/Pain Pain Assessment Pain Assessment: 0-10 Pain Score: 8  Pain Location: LLE Pain Descriptors / Indicators: Burning, Throbbing Pain Intervention(s): Monitored during session, Limited activity within patient's tolerance, Patient requesting pain meds-RN notified    Home Living Family/patient expects to be discharged to:: Unsure  Additional Comments: Working on finding help post-discharge and does not have a place to go back to    Prior Function Prior Level of Function : Independent/Modified Independent               ADLs Comments: Not working at the moment.     Extremity/Trunk Assessment   Upper Extremity Assessment Upper Extremity Assessment:  Defer to OT evaluation    Lower Extremity Assessment Lower Extremity Assessment: Generalized weakness;LLE deficits/detail LLE Deficits / Details: S/p debridement on LLE - Wound vac.    Cervical / Trunk Assessment Cervical / Trunk Assessment: Normal  Communication   Communication Communication: No apparent difficulties    Cognition Arousal: Alert Behavior During Therapy: Flat affect   PT - Cognitive impairments: No apparent impairments                         Following commands: Intact       Cueing Cueing Techniques: Verbal cues, Gestural cues     General Comments General comments (skin integrity, edema, etc.): VSS on RA    Exercises     Assessment/Plan    PT Assessment Patient needs continued PT services  PT Problem List Decreased activity tolerance;Decreased skin integrity;Decreased mobility;Decreased strength;Decreased balance       PT Treatment Interventions Functional mobility training;DME instruction;Gait training;Therapeutic activities;Therapeutic exercise    PT Goals (Current goals can be found in the Care Plan section)  Acute Rehab PT Goals Patient Stated Goal: Decrease pain and get better PT Goal Formulation: With patient Time For Goal Achievement: 12/27/23 Potential to Achieve Goals: Good    Frequency Min 2X/week     Co-evaluation               AM-PAC PT 6 Clicks Mobility  Outcome Measure Help needed turning from your back to your side while in a flat bed without using bedrails?: A Little Help needed moving from lying on your back to sitting on the side of a flat bed without using bedrails?: A Little Help needed moving to and from a bed to a chair (including a wheelchair)?: A Little Help needed standing up from a chair using your arms (e.g., wheelchair or bedside chair)?: A Little Help needed to walk in hospital room?: A Lot Help needed climbing 3-5 steps with a railing? : Total 6 Click Score: 15    End of Session    Activity Tolerance: Patient limited by pain Patient left: in bed;with bed alarm set;with call bell/phone within reach Nurse Communication: Mobility status;Patient requests pain meds PT Visit Diagnosis: Pain;Unsteadiness on feet (R26.81);Other abnormalities of gait and mobility (R26.89) Pain - Right/Left: Left Pain - part of body: Leg    Time: 8585-8566 PT Time Calculation (min) (ACUTE ONLY): 19 min   Charges:   PT Evaluation $PT Eval Low Complexity: 1 Low   PT General Charges $$ ACUTE PT VISIT: 1 Visit         Quintin Campi, SPT  Acute Rehab  [phone]   Quintin Campi 12/12/2023, 4:14 PM

## 2023-12-12 NOTE — Progress Notes (Addendum)
 PROGRESS NOTE Kelly [name]  FMW:[id number] DOB: May 03, 1986 DOA: 12/03/2023 PCP: Pcp, No  Brief Narrative/Hospital Course: 38 y.o. 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 difficile infection, bipolar disorder, hep C, hypertension, anemia presenting with complaints of rapidly progressive left leg erythema, pain, and swelling x 2 days PTA. In ED found to be septic, CT scan showed concerns of cellulitis of left lower extremity along with necrotizing fasciitis.she was started on broad-spectrum antibiotics and orthopedic consulted  S/P LLE I&D 7/22> Wound vac placed S/P LUE I&D 7/25>Wound vac placed.  Subjective: Patient seen and examined Alert awake resting comfortably although having some pain at the operative site Overnight patient has been afebrile BP stable Labs pending this morning   Assessment & Plan:   Severe sepsis POA  Left lower extremity necrotizing fasciitis  Left upper extremity abscess/necrotizing fasciitis IVDA hx: S/P LLE I&D 7/22> Wound vac placed> s/p final debridement on 7/30-application of micro graft and wound VAC S/P LUE I&D 7/25>Wound vac placed> not anticipating further surgery ID following closely, blood cultures since admission no growth. Cannot get PICC line due to IVDA/not a candidate for home IV antibiotic therapy. Patient wound growing group a strep > 7/28 changed to PCN> planning to complete antibiotics course oral  Previously she was on methadone , methadone  has been started- uptitrate slowly , but limited due to prolonged QTc monitoring EKG-now at 457  Suspicious behavior: On 7/26 Patient with suspicious behavior found to have white powder and small white rock like substance and security has removed the substance, cont tele safety sitter  Hep C positive: HIV negative,  HCV RNA not detected  AKI with metabolic acidosis resolved.   Anemia of chronic disease: Some drop suspect due to patient's acute illness.  Monitor and transfuse if <7 g   Hyponatremia Hypokalemia Hypomagnesemia: Replaced.  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 Currently no signs of volume overload, appears to be compensated.  repeat TTE now > EF 70-75%, dissipate left ventricle with right ventricular pressure overload mildly reduced RV SF severely elevated pulmonary artery systolic pressure severely dilated RA massive tricuspid valve regurgitation with severely dilated TV annulus with known history of TV endocarditis.  Advised to follow-up with outpatient cardiology   Ongoing substance abuse/IV drug abuse TOC consulted for counseling.  Patient reports she has been snorting and has not recently used IV drugs.  Reports being on methadone  and asking if she can resume.  Pharmacy consulted slowly started methadone > uptitrate slowly.  DVT prophylaxis: SCDs Start: 12/11/23 1151 SCDs Start: 12/03/23 0617 add chemical prophylaxis hold preop Code Status:   Code Status: Full Code Family Communication: plan of care discussed with patient at bedside. Patient status is: Remains hospitalized because of severity of illness Level of care: Med-Surg   Dispo: The patient is from: home            Anticipated disposition: TBD Objective: Vitals last 24 hrs: Vitals:   12/11/23 1657 12/11/23 2003 12/12/23 0450 12/12/23 0748  BP: 113/86 103/63 98/61 123/79  Pulse: 78 86 81 84  Resp: 16 16 17 17   Temp: 97.9 F (36.6 C) 97.6 F (36.4 C) 97.8 F (36.6 C) 97.7 F (36.5 C)  TempSrc: Oral Oral  Oral  SpO2: 95% 94% 100% 99%  Weight:      Height:  Physical Examination: General exam:AAOX3 HEENT:Oral mucosa moist, Ear/Nose WNL grossly Respiratory system: Bilaterally clear BS,no use of accessory muscle Cardiovascular system: S1 & S2 +,  No JVD. Gastrointestinal system: Abdomen soft,NT,ND, BS+ Nervous System: Alert, awake, moving all extremities,and following commands. Extremities:  LUE DRESSING IN PLACE AND VAC HAS BEEN REMOVED, LLE dressing clean dry intact with wound VAC  Skin: No rashes,no icterus. MSK: Normal muscle bulk,tone, power      Data Reviewed: I have personally reviewed following labs and imaging studies ( see epic result tab) CBC: Recent Labs  Lab 12/06/23 0901 12/07/23 0419 12/09/23 0709 12/10/23 1011  WBC 19.3* 14.9* 19.0* 16.0*  HGB 8.3* 7.3* 7.5* 7.8*  HCT 25.8* 23.2* 23.2* 24.9*  MCV 83.5 84.7 82.9 85.0  PLT 272 287 397 463*   CMP: Recent Labs  Lab 12/07/23 0419 12/09/23 0709 12/10/23 1011  NA 133* 132* 130*  K 5.1 4.6 4.5  CL 95* 96* 96*  CO2 26 25 25   GLUCOSE 92 81 144*  BUN 20 11 12   CREATININE 0.89 0.79 0.83  CALCIUM  8.4* 8.4* 8.7*  MG 1.6* 1.8 1.9   GFR: Estimated Creatinine Clearance: 96.1 mL/min (by C-G formula based on SCr of 0.83 mg/dL). No results for input(s): AST, ALT, ALKPHOS, BILITOT, PROT, ALBUMIN in the last 168 hours.  No results for input(s): LIPASE, AMYLASE in the last 168 hours. No results for input(s): AMMONIA in the last 168 hours. Coagulation Profile:  No results for input(s): INR, PROTIME in the last 168 hours.  Unresulted Labs (From admission, onward)     Start     Ordered   12/12/23 1100  Basic metabolic panel with GFR  Once,   R        12/12/23 1100   12/12/23 0500  Basic metabolic panel with GFR  Daily,   R     Question:  Specimen collection method  Answer:  Lab=Lab collect   12/11/23 0939   12/12/23 0500  CBC  Daily,   R     Question:  Specimen collection method  Answer:  Lab=Lab collect   12/11/23 0939           Antimicrobials/Microbiology: Anti-infectives (From admission, onward)    Start     Dose/Rate Route Frequency Ordered Stop   12/11/23 2200  linezolid  (ZYVOX ) tablet 600 mg        600 mg Oral Every 12 hours  12/11/23 1413 12/11/23 2137   12/11/23 0745  ceFAZolin  (ANCEF ) IVPB 2g/100 mL premix        2 g 200 mL/hr over 30 Minutes Intravenous On call to O.R. 12/11/23 0743 12/11/23 0914   12/11/23 0745  vancomycin  (VANCOREADY) IVPB 1250 mg/250 mL  Status:  Discontinued        1,250 mg 166.7 mL/hr over 90 Minutes Intravenous On call to O.R. 12/11/23 0743 12/11/23 1144   12/11/23 0705  ceFAZolin  (ANCEF ) 2-4 GM/100ML-% IVPB       Note to Pharmacy: Carolynn Friday E: cabinet override      12/11/23 0705 12/11/23 0918   12/11/23 0705  vancomycin  (VANCOCIN ) 1-5 GM/200ML-% IVPB       Note to Pharmacy: Carolynn Friday E: cabinet override      12/11/23 0705 12/11/23 0805   12/10/23 2200  linezolid  (ZYVOX ) tablet 600 mg  Status:  Discontinued        600 mg Oral Every 12 hours 12/10/23 1107 12/11/23 1413   12/09/23 1200  penicillin  G potassium 24 Million Units in  dextrose  5 % 500 mL CONTINUOUS infusion        24 Million Units 20.8 mL/hr over 24 Hours Intravenous Every 24 hours 12/09/23 1101     12/06/23 0800  ceFAZolin  (ANCEF ) IVPB 2g/100 mL premix  Status:  Discontinued        2 g 200 mL/hr over 30 Minutes Intravenous On call to O.R. 12/05/23 2149 12/06/23 1744   12/06/23 0800  vancomycin  (VANCOCIN ) IVPB 1000 mg/200 mL premix        1,000 mg 200 mL/hr over 60 Minutes Intravenous On call to O.R. 12/05/23 2149 12/06/23 1754   12/06/23 0600  ceFAZolin  (ANCEF ) IVPB 2g/100 mL premix  Status:  Discontinued        2 g 200 mL/hr over 30 Minutes Intravenous On call to O.R. 12/05/23 1132 12/05/23 1710   12/06/23 0600  vancomycin  (VANCOCIN ) IVPB 1000 mg/200 mL premix  Status:  Discontinued        1,000 mg 200 mL/hr over 60 Minutes Intravenous On call to O.R. 12/05/23 1132 12/05/23 1710   12/03/23 1230  linezolid  (ZYVOX ) IVPB 600 mg  Status:  Discontinued        600 mg 300 mL/hr over 60 Minutes Intravenous Every 12 hours 12/03/23 1204 12/10/23 1107   12/03/23 1100  clindamycin  (CLEOCIN ) IVPB 900 mg  Status:   Discontinued        900 mg 100 mL/hr over 30 Minutes Intravenous Every 8 hours 12/03/23 0619 12/03/23 1204   12/03/23 1000  piperacillin -tazobactam (ZOSYN ) IVPB 3.375 g  Status:  Discontinued        3.375 g 12.5 mL/hr over 240 Minutes Intravenous Every 8 hours 12/03/23 0652 12/09/23 1101   12/03/23 0652  vancomycin  variable dose per unstable renal function (pharmacist dosing)  Status:  Discontinued         Does not apply See admin instructions 12/03/23 0652 12/03/23 1204   12/03/23 0245  piperacillin -tazobactam (ZOSYN ) IVPB 3.375 g        3.375 g 100 mL/hr over 30 Minutes Intravenous  Once 12/03/23 0237 12/03/23 0338   12/03/23 0245  clindamycin  (CLEOCIN ) IVPB 900 mg        900 mg 100 mL/hr over 30 Minutes Intravenous  Once 12/03/23 0237 12/03/23 0338   12/03/23 0245  vancomycin  (VANCOCIN ) 1,750 mg in sodium chloride  0.9 % 500 mL IVPB  Status:  Discontinued        1,750 mg 258.8 mL/hr over 120 Minutes Intravenous  Once 12/03/23 0241 12/03/23 0242   12/03/23 0245  vancomycin  (VANCOREADY) IVPB 1750 mg/350 mL        1,750 mg 175 mL/hr over 120 Minutes Intravenous Once 12/03/23 0243 12/03/23 0548         Component Value Date/Time   SDES TISSUE LEFT LEG 12/06/2023 1621   SPECREQUEST MEDIAL SAMPLE B, PT ON VANC 12/06/2023 1621   CULT  12/06/2023 1621    No growth aerobically or anaerobically. Performed at [facility], [address] [place], [place] [address]    REPTSTATUS 12/11/2023 FINAL 12/06/2023 1621    Procedures: Procedure(s) (LRB): IRRIGATION AND DEBRIDEMENT WOUND (Left) APPLICATION, WOUND VAC (Left) Medications reviewed:  Scheduled Meds:  docusate sodium   100 mg Oral BID   methadone   40 mg Oral Daily   Continuous Infusions:  sodium chloride      penicillin  G potassium 24 Million Units in dextrose  5 % 500 mL CONTINUOUS infusion 24 Million Units (12/12/23 1118)    Mennie LAMY, MD [facility] 12/12/2023,  11:37 AM

## 2023-12-12 NOTE — Progress Notes (Signed)
 Patient ID: Kelly [name], female   DOB: 1985/09/05, 38 y.o.   MRN: [id number] Patient is postoperative day 1 repeat debridement left lower extremity for necrotizing formation.  There is 300 cc in the wound VAC canister without a good suction fit.  Patient may discharge when the drainage is less than 50 cc in a day.  I will rewrap the wound VAC dressing.  And patient can discharge on the Prevena plus portable wound VAC pump.  The dressing was changed on the left upper extremity and the incisions on the hand and forearm are well-healed no further intervention necessary for the left arm.

## 2023-12-12 NOTE — Progress Notes (Signed)
 [facility]  Date of Admission:  12/03/2023     Reason for Follow Up: Cellulitis  Total days of antibiotics 11         ASSESSMENT:  Kelly [name] is a 38 y/o caucasian female with history of IVDU and disseminated MRSA infection and tricuspid valve endocarditis presenting with rapidly worsening edema, redness and pain of her left lower and upper extremities and found to have elevated LRINEC score concerning for necrotizing fasciitis s/p left upper and lower extremity debridement with culture growing Group A Streptococcus.   Kelly [name] is POD #1 from repeat debridement with no further surgical interventions planned in the setting of necrotizing fasciitis. Discussed plan of care to discontinue linezolid  and continue current dose of Penicillin  and transition to oral amoxicillin  at discharge for 14 days from 12/11/23. Continue post-operative wound care per Orthopedics. Ok for discharge from ID standpoint with no follow up needed. Remaining medical and supportive care per Internal Medicine.  PLAN:  Continue current dose of penicillin  Transition to amoxicillin  1 g PO TID at discharge for total of 14 days from 12/11/23.  Post-operative wound care per Orthopedics.  Ok for discharge from ID standpoint with no follow up needed.  Remaining medical and supportive care per Internal Medicine.   ID will sign off. Please re-consult if needed.   Principal Problem:   Cellulitis Active Problems:   Substance abuse (HCC)   AKI (acute kidney injury) (HCC)   Severe sepsis (HCC)   Abscess of left lower leg   Hypokalemia   Necrotizing fasciitis (HCC)   Abscess of arm, left   Cutaneous abscess of left foot    docusate sodium   100 mg Oral BID   methadone   40 mg Oral Daily    SUBJECTIVE:  Afebrile overnight with no acute events. Tolerating antibiotics with no adverse side effects.   Allergies  Allergen Reactions   Hydrocodone  Itching   Morphine  And Codeine Nausea And  Vomiting     Review of Systems: Review of Systems  Constitutional:  Negative for chills, fever and weight loss.  Respiratory:  Negative for cough, shortness of breath and wheezing.   Cardiovascular:  Negative for chest pain and leg swelling.  Gastrointestinal:  Negative for abdominal pain, constipation, diarrhea, nausea and vomiting.  Skin:  Negative for rash.      OBJECTIVE: Vitals:   12/11/23 1657 12/11/23 2003 12/12/23 0450 12/12/23 0748  BP: 113/86 103/63 98/61 123/79  Pulse: 78 86 81 84  Resp: 16 16 17 17   Temp: 97.9 F (36.6 C) 97.6 F (36.4 C) 97.8 F (36.6 C) 97.7 F (36.5 C)  TempSrc: Oral Oral  Oral  SpO2: 95% 94% 100% 99%  Weight:      Height:       Body mass index is 25.84 kg/m.  Physical Exam Constitutional:      General: She is not in acute distress.    Appearance: She is well-developed.  Cardiovascular:     Rate and Rhythm: Normal rate and regular rhythm.     Heart sounds: Normal heart sounds.  Pulmonary:     Effort: Pulmonary effort is normal.     Breath sounds: Normal breath sounds.  Skin:    General: Skin is warm and dry.  Neurological:     Mental Status: She is alert.  Psychiatric:        Mood and Affect: Mood normal.     Lab Results Lab Results  Component Value Date  WBC 16.0 (H) 12/10/2023   HGB 7.8 (L) 12/10/2023   HCT 24.9 (L) 12/10/2023   MCV 85.0 12/10/2023   PLT 463 (H) 12/10/2023    Lab Results  Component Value Date   CREATININE 0.83 12/10/2023   BUN 12 12/10/2023   NA 130 (L) 12/10/2023   K 4.5 12/10/2023   CL 96 (L) 12/10/2023   CO2 25 12/10/2023    Lab Results  Component Value Date   ALT 10 12/03/2023   AST 29 12/03/2023   ALKPHOS 121 12/03/2023   BILITOT 1.0 12/03/2023     Microbiology: Recent Results (from the past 240 hours)  Blood Culture (routine x 2)     Status: None   Collection Time: 12/03/23  2:56 AM   Specimen: BLOOD  Result Value Ref Range Status   Specimen Description BLOOD BLOOD LEFT ARM   Final   Special Requests   Final    BOTTLES DRAWN AEROBIC ONLY Blood Culture results may not be optimal due to an inadequate volume of blood received in culture bottles   Culture   Final    NO GROWTH 5 DAYS Performed at [facility], [address] [place], [place] [address]    Report Status 12/08/2023 FINAL  Final  Blood Culture (routine x 2)     Status: None   Collection Time: 12/03/23  2:58 AM   Specimen: BLOOD  Result Value Ref Range Status   Specimen Description BLOOD BLOOD RIGHT ARM  Final   Special Requests   Final    BOTTLES DRAWN AEROBIC AND ANAEROBIC Blood Culture adequate volume   Culture   Final    NO GROWTH 5 DAYS Performed at [name], [address] [place], [place] [address]    Report Status 12/08/2023 FINAL  Final  Resp panel by RT-PCR (RSV, Flu A&B, Covid) Anterior Nasal Swab     Status: None   Collection Time: 12/03/23  3:50 AM   Specimen: Anterior Nasal Swab  Result Value Ref Range Status   SARS Coronavirus 2 by RT PCR NEGATIVE NEGATIVE Final   Influenza A by PCR NEGATIVE NEGATIVE Final   Influenza B by PCR NEGATIVE NEGATIVE Final    Comment: (NOTE) The Xpert Xpress SARS-CoV-2/FLU/RSV plus assay is intended as an aid in the diagnosis of influenza from Nasopharyngeal swab specimens and should not be used as a sole basis for treatment. Nasal washings and aspirates are unacceptable for Xpert Xpress SARS-CoV-2/FLU/RSV testing.  Fact Sheet for Patients: BloggerCourse.com  Fact Sheet for Healthcare Providers: SeriousBroker.it  This test is not yet approved or cleared by the [place]  FDA and has been authorized for detection and/or diagnosis of SARS-CoV-2 by FDA under an Emergency Use Authorization (EUA). This EUA will remain in effect (meaning this test can be used) for the duration of the COVID-19 declaration under Section 564(b)(1) of the Act, 21 U.S.C. section 360bbb-3(b)(1), unless  the authorization is terminated or revoked.     Resp Syncytial Virus by PCR NEGATIVE NEGATIVE Final    Comment: (NOTE) Fact Sheet for Patients: BloggerCourse.com  Fact Sheet for Healthcare Providers: SeriousBroker.it  This test is not yet approved or cleared by the [place]  FDA and has been authorized for detection and/or diagnosis of SARS-CoV-2 by FDA under an Emergency Use Authorization (EUA). This EUA will remain in effect (meaning this test can be used) for the duration of the COVID-19 declaration under Section 564(b)(1) of the Act, 21 U.S.C. section 360bbb-3(b)(1), unless the authorization  is terminated or revoked.  Performed at [facility], [address], [place], [place] [address]   Surgical pcr screen     Status: Abnormal   Collection Time: 12/03/23  6:44 AM   Specimen: Nasal Mucosa; Nasal Swab  Result Value Ref Range Status   MRSA, PCR POSITIVE (A) NEGATIVE Final    Comment: RESULT CALLED TO, READ BACK BY AND VERIFIED WITH: RN HEATHER BOWMAN [phone] [id number] BY J RAZZAK, MT    Staphylococcus aureus POSITIVE (A) NEGATIVE Final    Comment: RESULT CALLED TO, READ BACK BY AND VERIFIED WITH: RN POWELL SINK [id number] [phone] BY J RAZZAK, MT (NOTE) The Xpert SA Assay (FDA approved for NASAL specimens in patients 34 years of age and older), is one component of a comprehensive surveillance program. It is not intended to diagnose infection nor to guide or monitor treatment. Performed at [facility], [address] [place], [place] [address]   Aerobic/Anaerobic Culture w Gram Stain (surgical/deep wound)     Status: None   Collection Time: 12/06/23  4:11 PM   Specimen: Soft Tissue, Other  Result Value Ref Range Status   Specimen Description TISSUE LEFT LEG  Final   Special Requests SAMPLE A, PT ON VANC  Final   Gram Stain   Final    FEW WBC PRESENT, PREDOMINANTLY PMN NO ORGANISMS SEEN    Culture   Final     RARE GROUP A STREP (S.PYOGENES) ISOLATED Beta hemolytic streptococci are predictably susceptible to penicillin  and other beta lactams. Susceptibility testing not routinely performed. NO ANAEROBES ISOLATED Performed at [facility], [address], [place], [place] [address]    Report Status 12/11/2023 FINAL  Final  Aerobic/Anaerobic Culture w Gram Stain (surgical/deep wound)     Status: None   Collection Time: 12/06/23  4:21 PM   Specimen: Soft Tissue, Other  Result Value Ref Range Status   Specimen Description TISSUE LEFT LEG  Final   Special Requests MEDIAL SAMPLE B, PT ON VANC  Final   Gram Stain   Final    RARE WBC PRESENT, PREDOMINANTLY PMN NO ORGANISMS SEEN    Culture   Final    No growth aerobically or anaerobically. Performed at [facility], [address], [place], [place] [address]    Report Status 12/11/2023 FINAL  Final  Surgical pcr screen     Status: Abnormal   Collection Time: 12/11/23  6:39 AM   Specimen: Nasal Mucosa; Nasal Swab  Result Value Ref Range Status   MRSA, PCR POSITIVE (A) NEGATIVE Final   Staphylococcus aureus POSITIVE (A) NEGATIVE Final    Comment: RESULTS CALLED TO, READ BACK BY AND VERIFIED WITH RN A. WOMACK ON 12/11/23 AT 0818 BY NM (NOTE) The Xpert SA Assay (FDA approved for NASAL specimens in patients 62 years of age and older), is one component of a comprehensive surveillance program. It is not intended to diagnose infection nor to guide or monitor treatment. Performed at [facility], [address] [place], [place] [address]      Cathlyn July, NP [facility]  12/12/2023  2:21 PM

## 2023-12-12 NOTE — Plan of Care (Signed)
  Problem: Education: Goal: Knowledge of General Education information will improve Description: Including pain rating scale, medication(s)/side effects and non-pharmacologic comfort measures 12/12/2023 0415 by Karsten Leah HERO, RN Outcome: Progressing 12/12/2023 0415 by Karsten Leah HERO, RN Outcome: Progressing   Problem: Health Behavior/Discharge Planning: Goal: Ability to manage health-related needs will improve 12/12/2023 0415 by Karsten Leah HERO, RN Outcome: Progressing 12/12/2023 0415 by Karsten Leah HERO, RN Outcome: Progressing   Problem: Clinical Measurements: Goal: Ability to maintain clinical measurements within normal limits will improve 12/12/2023 0415 by Karsten Leah HERO, RN Outcome: Progressing 12/12/2023 0415 by Karsten Leah HERO, RN Outcome: Progressing Goal: Will remain free from infection 12/12/2023 0415 by Karsten Leah HERO, RN Outcome: Progressing 12/12/2023 0415 by Karsten Leah HERO, RN Outcome: Progressing Goal: Diagnostic test results will improve 12/12/2023 0415 by Karsten Leah HERO, RN Outcome: Progressing 12/12/2023 0415 by Karsten Leah HERO, RN Outcome: Progressing Goal: Respiratory complications will improve 12/12/2023 0415 by Karsten Leah HERO, RN Outcome: Progressing 12/12/2023 0415 by Karsten Leah HERO, RN Outcome: Progressing Goal: Cardiovascular complication will be avoided 12/12/2023 0415 by Karsten Leah HERO, RN Outcome: Progressing 12/12/2023 0415 by Karsten Leah HERO, RN Outcome: Progressing   Problem: Activity: Goal: Risk for activity intolerance will decrease 12/12/2023 0415 by Karsten Leah HERO, RN Outcome: Progressing 12/12/2023 0415 by Karsten Leah HERO, RN Outcome: Progressing   Problem: Nutrition: Goal: Adequate nutrition will be maintained 12/12/2023 0415 by Karsten Leah HERO, RN Outcome: Progressing 12/12/2023 0415 by Karsten Leah HERO, RN Outcome: Progressing   Problem: Coping: Goal: Level of anxiety will decrease 12/12/2023  0415 by Karsten Leah HERO, RN Outcome: Progressing 12/12/2023 0415 by Karsten Leah HERO, RN Outcome: Progressing   Problem: Elimination: Goal: Will not experience complications related to bowel motility Outcome: Progressing Goal: Will not experience complications related to urinary retention Outcome: Progressing   Problem: Pain Managment: Goal: General experience of comfort will improve and/or be controlled Outcome: Progressing   Problem: Safety: Goal: Ability to remain free from injury will improve Outcome: Progressing   Problem: Skin Integrity: Goal: Risk for impaired skin integrity will decrease Outcome: Progressing

## 2023-12-13 DIAGNOSIS — L03114 Cellulitis of left upper limb: Secondary | ICD-10-CM | POA: Diagnosis not present

## 2023-12-13 MED ORDER — METHADONE HCL 10 MG PO TABS
60.0000 mg | ORAL_TABLET | Freq: Every day | ORAL | Status: DC
  Administered 2023-12-14 – 2023-12-15 (×2): 60 mg via ORAL
  Filled 2023-12-13 (×2): qty 6

## 2023-12-13 NOTE — Progress Notes (Signed)
 MEDICATION RELATED CONSULT NOTE  Pharmacy Consult for methadone  initiation Indication: opioid use disorder  Allergies  Allergen Reactions   Hydrocodone  Itching   Morphine  And Codeine Nausea And Vomiting    Patient Measurements: Height: 5' 7 (170.2 cm) Weight: 74.8 kg (165 lb) IBW/kg (Calculated) : 61.6  Vital Signs: Temp: 98.2 F (36.8 C) (08/01 1347) Temp Source: Oral (08/01 1347) BP: 103/65 (08/01 1347) Pulse Rate: 88 (08/01 1347)   Estimated Creatinine Clearance: 99.7 mL/min (by C-G formula based on SCr of 0.8 mg/dL).  Assessment: 38 y.o. 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   Previously (2022) was stable and following with outpatient methadone  clinic. Has since relapsed and has been lost to follow-up. Patient now asking to be reinitiated of methadone  treatment for OUD. Previously required doses ~100-150mg /day. Reinitiated at low dose with goal of titrating more rapidly.   Increased 3 days ago to 40mg , appears to be tolerating well. EKG with improved Qtc. Will increase further to 60mg .  Plan:  Increase methadone  60 mg PO daily  Reassess toleration, withdrawal s/sx  Increase as needed in 2-3 days   Thank you for allowing pharmacy to be a part of this patient's care.  Shelba Collier, PharmD, BCPS Clinical Pharmacist

## 2023-12-13 NOTE — Evaluation (Signed)
 Occupational Therapy Evaluation Patient Details Name: Kelly [name] MRN: [id number] DOB: [birth year] Today's Date: 12/13/2023   History of Present Illness   38 y.o. female presents to [facility] hospital on 12/03/23 with LUE and LLE redness, pain, and swelling, concern for necrotizing fasciitis. Underwent debridement and wound vac placement 7/22. Underwent fasciotomy and I&D of L hand and forearm 7/24 for same thing. F/U I&D on 7/25, 7/30. PMH:  HTN, systolic CHF with moderate pulmonary hypertension and severe TR, polysubstance abuse including history of IV drug use, recurrent history of endocarditis, C. difficile colitis, MRSA bacteremia, sepsis, bipolar do     Clinical Impressions At baseline, pt is Ind with ADLs, IADLs, and functional mobility without an AD. Pt now presents with pain affecting functional level, decreased balance, decreased activity tolerance, decreased knowledge of AE/DME, edematous Left hand, and decreased safety and independence with functional tasks. Pt currently demonstrates ability to complete ADLs largely Ind to Mod assist and functional tranfers with a RW with Contact guard assist. Suspect pt will progress quickly once pain management improves. Pt will benefit from acute skilled OT services. Post acute discharge, pt will benefit from outpatient OT to maximize rehab potential.      If plan is discharge home, recommend the following:   A little help with walking and/or transfers;A little help with bathing/dressing/bathroom;Assistance with cooking/housework;Direct supervision/assist for medications management;Assist for transportation;Help with stairs or ramp for entrance     Functional Status Assessment   Patient has had a recent decline in their functional status and demonstrates the ability to make significant improvements in function in a reasonable and predictable amount of time.     Equipment Recommendations   BSC/3in1;Other (comment) (RW)     Recommendations  for Other Services         Precautions/Restrictions   Precautions Precautions: Fall;Other (comment) Recall of Precautions/Restrictions: Intact Precaution/Restrictions Comments: Wound Vac on LLE Restrictions Weight Bearing Restrictions Per Provider Order: No Other Position/Activity Restrictions: L UE and L LE WBAT     Mobility Bed Mobility Overal bed mobility: Needs Assistance Bed Mobility: Supine to Sit, Sit to Supine     Supine to sit: Supervision, HOB elevated Sit to supine: Supervision, HOB elevated   General bed mobility comments: For safety    Transfers Overall transfer level: Needs assistance Equipment used: Rolling walker (2 wheels) Transfers: Bed to chair/wheelchair/BSC, Sit to/from Stand Sit to Stand: Contact guard assist     Step pivot transfers: Contact guard assist     General transfer comment: assist to steady with power up and balance when stepping      Balance Overall balance assessment: Needs assistance Sitting-balance support: Single extremity supported, No upper extremity supported, Feet supported Sitting balance-Leahy Scale: Good Sitting balance - Comments: Able to sit EOB w/ Supervision w/ no LOB   Standing balance support: Single extremity supported, Bilateral upper extremity supported, During functional activity, Reliant on assistive device for balance Standing balance-Leahy Scale: Fair Standing balance comment: Requires RW to stand and to offload LLE due to pain in leg; able to perform peri care in standing with unilateral UE support and close Supervision for safety                           ADL either performed or assessed with clinical judgement   ADL Overall ADL's : Needs assistance/impaired Eating/Feeding: Independent;Sitting   Grooming: Set up;Sitting   Upper Body Bathing: Set up;Supervision/ safety;Sitting   Lower Body Bathing: Minimal assistance;Cueing  for safety;Cueing for compensatory techniques;Sitting/lateral  leans;Sit to/from stand   Upper Body Dressing : Contact guard assist;Sitting   Lower Body Dressing: Supervision/safety;Set up;Sitting/lateral leans;Moderate assistance;Sit to/from stand Lower Body Dressing Details (indicate cue type and reason): Supervision for donning/doffing R sock; Mod assist for other LB clothing Toilet Transfer: Contact guard assist;Rolling walker (2 wheels);BSC/3in1 (step-pivot)   Toileting- Clothing Manipulation and Hygiene: Supervision/safety;Sitting/lateral lean;Sit to/from stand Toileting - Clothing Manipulation Details (indicate cue type and reason): uainf RW for unilateral UE support; close Supervision for safety     Functional mobility during ADLs:  (unable to progress due to R LE pain) General ADL Comments: Pt with decreased activity tolerance and decreased sitting/standing tolerance with pt reporting increased pain in L LE in sitting/standing     Vision Baseline Vision/History: 0 No visual deficits Ability to See in Adequate Light: 0 Adequate Patient Visual Report: No change from baseline Vision Assessment?: No apparent visual deficits Additional Comments: Vision [facility] for tasks assessed; not formally screened or evaluated     Perception         Praxis         Pertinent Vitals/Pain Pain Assessment Pain Assessment: Faces Faces Pain Scale: Hurts whole lot Pain Location: LLE Pain Descriptors / Indicators: Burning, Throbbing, Grimacing, Guarding Pain Intervention(s): Limited activity within patient's tolerance, Monitored during session, Repositioned, Patient requesting pain meds-RN notified     Extremity/Trunk Assessment Upper Extremity Assessment Upper Extremity Assessment: Right hand dominant;Generalized weakness;LUE deficits/detail LUE Deficits / Details: s/p debridement on L UE; edematous hand; bruising to hand; ROM, sensation, and coordination WFL LUE Sensation: WNL LUE Coordination: WNL   Lower Extremity Assessment Lower Extremity  Assessment: Defer to PT evaluation   Cervical / Trunk Assessment Cervical / Trunk Assessment: Normal   Communication Communication Communication: No apparent difficulties   Cognition Arousal: Alert Behavior During Therapy: Flat affect, Impulsive (Pt occasionally impulsive with movement due to L LE pain) Cognition: No apparent impairments             OT - Cognition Comments: Pt AAOx4 with cognition WFL for tasks assessed. Cogntiion not formally screened or evaluated.                 Following commands: Intact       Cueing  General Comments   Cueing Techniques: Verbal cues      Exercises     Shoulder Instructions      Home Living Family/patient expects to be discharged to:: Private residence Living Arrangements: Other relatives (sister) Available Help at Discharge: Family;Available PRN/intermittently Type of Home: Apartment Home Access: Stairs to enter                         Additional Comments: Pt reports hse plans to discharge to her sister's home. Pt reports she knows her sister lives in an apartment with stairs to enter, but pt has never been there is unsure of other details of the apartment.      Prior Functioning/Environment Prior Level of Function : Independent/Modified Independent             Mobility Comments: Ind ADLs Comments: Ind with ADLs and IADLs; not presently working    OT Problem List: Decreased activity tolerance;Impaired balance (sitting and/or standing);Decreased knowledge of precautions;Decreased knowledge of use of DME or AE;Pain;Increased edema   OT Treatment/Interventions: Self-care/ADL training;Energy conservation;DME and/or AE instruction;Therapeutic activities;Balance training;Patient/family education      OT Goals(Current goals can be found in the care plan  section)   Acute Rehab OT Goals Patient Stated Goal: to have less pain OT Goal Formulation: With patient Time For Goal Achievement:  12/27/23 Potential to Achieve Goals: Good ADL Goals Pt Will Perform Grooming: with modified independence;standing Pt Will Perform Lower Body Bathing: with modified independence;sitting/lateral leans;sit to/from stand Pt Will Perform Lower Body Dressing: with modified independence;sitting/lateral leans;sit to/from stand Pt Will Transfer to Toilet: with modified independence;ambulating;regular height toilet (with least restrictive AD) Pt Will Perform Toileting - Clothing Manipulation and hygiene: with modified independence;sit to/from stand;sitting/lateral leans   OT Frequency:  Min 2X/week    Co-evaluation              AM-PAC OT 6 Clicks Daily Activity     Outcome Measure Help from another person eating meals?: None Help from another person taking care of personal grooming?: A Little Help from another person toileting, which includes using toliet, bedpan, or urinal?: A Little Help from another person bathing (including washing, rinsing, drying)?: A Little Help from another person to put on and taking off regular upper body clothing?: A Little Help from another person to put on and taking off regular lower body clothing?: A Little 6 Click Score: 19   End of Session Equipment Utilized During Treatment: Gait belt;Other (comment) [facility]) Nurse Communication: Mobility status;Patient requests pain meds;Other (comment) (Discharge plan)  Activity Tolerance: Patient limited by pain Patient left: in bed;with call bell/phone within reach;with bed alarm set  OT Visit Diagnosis: Unsteadiness on feet (R26.81);Other abnormalities of gait and mobility (R26.89);Pain                Time: 1032-1050 OT Time Calculation (min): 18 min Charges:  OT General Charges $OT Visit: 1 Visit OT Evaluation $OT Eval Low Complexity: 1 Low  Margarie Rockey HERO., OTR/L, MA Acute Rehab [phone]   Margarie FORBES Horns 12/13/2023, 11:13 AM

## 2023-12-13 NOTE — TOC Progression Note (Signed)
 Transition of Care [facility]) - Progression Note    Patient Details  Name: Kelly [name] MRN: [id number] Date of Birth: 1986/03/06  Transition of [facility]) CM/SW Contact  Rosalva Jon Bloch, RN Phone Number: 12/13/2023, 2:37 PM  Clinical Narrative:    Pt agreeable to outpatient PT and OT services. Referral made with [facility]  and noted on AVS.  Expected Discharge Plan: Home/Self Care Barriers to Discharge: Other (must enter comment), Homeless with medical needs (Needs methadone  follow up)               Expected Discharge Plan and Services In-house Referral: Clinical Social Work     Living arrangements for the past 2 months: Homeless, No permanent address                                       Social Drivers of Health (SDOH) Interventions SDOH Screenings   Food Insecurity: No Food Insecurity (12/03/2023)  Housing: High Risk (12/03/2023)  Transportation Needs: Unmet Transportation Needs (12/03/2023)  Utilities: At Risk (12/03/2023)  Alcohol Screen: Low Risk  (08/03/2020)  Depression (PHQ2-9): Medium Risk (09/26/2020)  Financial Resource Strain: High Risk (08/03/2020)  Tobacco Use: Medium Risk (12/11/2023)    Readmission Risk Interventions     No data to display

## 2023-12-13 NOTE — Progress Notes (Signed)
 CSW spoke to patient about SDOH concerns of housing, transportation, and utilities.  Housing - Patient has reported that they have been living from place to place due to unstable housing situation. Patient stated that they are in the process of setting up a temporary living situations with their sister [name]. Patient states that she does not have income at this moment and CSW informed the pt that they would not be able to get permanent housing without income. [employer] was provided.  Transportation - Patient stated that they do not have reliable transportation. CSW asked about medicaid transportation and the pt stated that they are aware of medicaid transportation but have not set it up yet. CSW provided the phone number for medicaid transportation and the patient agreed to call and set up transportation.  Utilities - The patient does not have stable housing or income. No current utility issues.    Lendia Dais, LCSWA.

## 2023-12-13 NOTE — Progress Notes (Signed)
 PROGRESS NOTE Kelly [name]  FMW:[id number] DOB: [birth year] DOA: 12/03/2023 PCP: Pcp, No  Brief Narrative/Hospital Course: 38 y.o. 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 difficile infection, bipolar disorder, hep C, hypertension, anemia presenting with complaints of rapidly progressive left leg erythema, pain, and swelling x 2 days PTA. In ED found to be septic, CT scan showed concerns of cellulitis of left lower extremity along with necrotizing fasciitis.she was started on broad-spectrum antibiotics and orthopedic consulted  S/P LLE I&D 7/22> Wound vac placed S/P LUE I&D 7/25>Wound vac placed.  Patient again underwent final debridement on 7/30 with application of wound VAC on left lower extremity Overall clinically improving and stable.  ID advised switching to amoxicillin  1 g 3 times daily at discharge to cover total 14 days from 7/30  Subjective: Patient seen and examined Resting comfortably although complains of pain Overnight events afebrile BP stable Dressing reinforced this morning She states she is not prepared or ready to go home today and may be able to go tomorrow.  Assessment & Plan:   Severe sepsis POA  Left lower extremity necrotizing fasciitis  Left upper extremity abscess/necrotizing fasciitis IVDA hx: S/P LLE I&D 7/22> Wound vac placed> s/p final debridement on 7/30-application of micro graft and wound VAC S/P LUE I&D 7/25>Wound vac placed> not anticipating further surgery ID following closely, blood cultures since admission no growth. Cannot get PICC line due to IVDA/not a candidate for home IV antibiotic therapy. Patient wound growing group a strep > 7/28 changed to PCN Overall clinically improving and stable.  ID advised switching to amoxicillin  1 g 3 times daily at discharge to  cover total 14 days from 7/30 Dr. Harden presents for outpatient follow-up wound care and on discharge patient will continue with Prevena  vac Patient reports she is not ready to go home until tomorrow.  Suspicious behavior Ongoing substance abuse/IV drug abuse TOC consulted for counseling.  Patient reports she has been snorting and has not recently used IV drugs. Reports being on methadone  and asking if she can resume.  Pharmacy consulted and started methadon- TOC consult to set up fu at methadone  clinic  Hep C positive: HIV negative, HCV RNA not detected  AKI with metabolic acidosis resolved.   Anemia of chronic disease: Some drop suspect due to patient's acute illness.  Monitor and transfuse if <7 g fu w/ PCP   Hyponatremia Hypokalemia Hypomagnesemia: Stable  HFmrEF History of severe TR and pulmonary hypertension TTE 01/2021 showedEF 40 to 45%. Currently no signs of volume overload, appears to be compensated.  repeat TTE now > EF 70-75%, dissipate left ventricle with right ventricular pressure overload mildly reduced RV SF severely elevated pulmonary artery systolic pressure severely dilated RA massive tricuspid valve regurgitation with severely dilated TV annulus with known history of TV endocarditis.  Advised to follow-up with outpatient cardiology  On 7/26 Patient with suspicious behavior found to have white powder and small white rock like substance and security has removed the substance, cont tele safety sitter  DVT prophylaxis: enoxaparin  (LOVENOX ) injection 40 mg Start: 12/12/23 1700 SCDs Start: 12/11/23 1151 SCDs Start: 12/03/23 0617 add chemical prophylaxis hold preop Code Status:   Code Status: Full Code Family Communication: plan of care discussed with patient at bedside. Patient status is: Remains hospitalized because of severity of illness Level of care: Med-Surg   Dispo: The patient is from: home  Anticipated disposition: home tomorrow Objective: Vitals  last 24 hrs: Vitals:   12/12/23 1520 12/12/23 1956 12/13/23 0356 12/13/23 0743  BP: 115/66 (!) 102/56 109/85 117/82  Pulse: 92 90 93 82  Resp: 18 16 17 18   Temp: 98.3 F (36.8 C) 98.8 F (37.1 C) 98.1 F (36.7 C) 98.4 F (36.9 C)  TempSrc: Oral Oral Oral Oral  SpO2: 94% 98% 100% 93%  Weight:      Height:        Physical Examination: General exam:AAOX3 HEENT:Oral mucosa moist, Ear/Nose WNL grossly Respiratory system: Bilaterally clear BS,no use of accessory muscle Cardiovascular system: S1 & S2 +, No JVD. Gastrointestinal system: Abdomen soft,NT,ND, BS+ Nervous System: Alert, awake, moving all extremities,and following commands. Extremities:  LUE DRESSING IN PLACE, LEFT LOWER EXTREMITY WOUND VAC IN PLACE  Skin: No rashes,no icterus. MSK: Normal muscle bulk,tone, power      Data Reviewed: I have personally reviewed following labs and imaging studies ( see epic result tab) CBC: Recent Labs  Lab 12/07/23 0419 12/09/23 0709 12/10/23 1011  WBC 14.9* 19.0* 16.0*  HGB 7.3* 7.5* 7.8*  HCT 23.2* 23.2* 24.9*  MCV 84.7 82.9 85.0  PLT 287 397 463*   CMP: Recent Labs  Lab 12/07/23 0419 12/09/23 0709 12/10/23 1011 12/12/23 1838  NA 133* 132* 130* 134*  K 5.1 4.6 4.5 4.6  CL 95* 96* 96* 99  CO2 26 25 25 22   GLUCOSE 92 81 144* 110*  BUN 20 11 12 18   CREATININE 0.89 0.79 0.83 0.80  CALCIUM  8.4* 8.4* 8.7* 8.5*  MG 1.6* 1.8 1.9  --    GFR: Estimated Creatinine Clearance: 99.7 mL/min (by C-G formula based on SCr of 0.8 mg/dL). No results for input(s): AST, ALT, ALKPHOS, BILITOT, PROT, ALBUMIN in the last 168 hours.  No results for input(s): LIPASE, AMYLASE in the last 168 hours. No results for input(s): AMMONIA in the last 168 hours. Coagulation Profile:  No results for input(s): INR, PROTIME in the last 168 hours.  Unresulted Labs (From admission, onward)     Start     Ordered   12/12/23 0500  Basic metabolic panel with GFR  Daily,   R      Question:  Specimen collection method  Answer:  Lab=Lab collect   12/11/23 0939   12/12/23 0500  CBC  Daily,   R     Question:  Specimen collection method  Answer:  Lab=Lab collect   12/11/23 0939           Antimicrobials/Microbiology: Anti-infectives (From admission, onward)    Start     Dose/Rate Route Frequency Ordered Stop   12/11/23 2200  linezolid  (ZYVOX ) tablet 600 mg        600 mg Oral Every 12 hours 12/11/23 1413 12/11/23 2137   12/11/23 0745  ceFAZolin  (ANCEF ) IVPB 2g/100 mL premix        2 g 200 mL/hr over 30 Minutes Intravenous On call to O.R. 12/11/23 0743 12/11/23 0914   12/11/23 0745  vancomycin  (VANCOREADY) IVPB 1250 mg/250 mL  Status:  Discontinued        1,250 mg 166.7 mL/hr over 90 Minutes Intravenous On call to O.R. 12/11/23 0743 12/11/23 1144   12/11/23 0705  ceFAZolin  (ANCEF ) 2-4 GM/100ML-% IVPB       Note to Pharmacy: Carolynn Friday E: cabinet override      12/11/23 0705 12/11/23 0918   12/11/23 0705  vancomycin  (VANCOCIN ) 1-5 GM/200ML-% IVPB  Note to Pharmacy: Carolynn Friday E: cabinet override      12/11/23 0705 12/11/23 0805   12/10/23 2200  linezolid  (ZYVOX ) tablet 600 mg  Status:  Discontinued        600 mg Oral Every 12 hours 12/10/23 1107 12/11/23 1413   12/09/23 1200  penicillin  G potassium 24 Million Units in dextrose  5 % 500 mL CONTINUOUS infusion        24 Million Units 20.8 mL/hr over 24 Hours Intravenous Every 24 hours 12/09/23 1101     12/06/23 0800  ceFAZolin  (ANCEF ) IVPB 2g/100 mL premix  Status:  Discontinued        2 g 200 mL/hr over 30 Minutes Intravenous On call to O.R. 12/05/23 2149 12/06/23 1744   12/06/23 0800  vancomycin  (VANCOCIN ) IVPB 1000 mg/200 mL premix        1,000 mg 200 mL/hr over 60 Minutes Intravenous On call to O.R. 12/05/23 2149 12/06/23 1754   12/06/23 0600  ceFAZolin  (ANCEF ) IVPB 2g/100 mL premix  Status:  Discontinued        2 g 200 mL/hr over 30 Minutes Intravenous On call to O.R. 12/05/23 1132 12/05/23  1710   12/06/23 0600  vancomycin  (VANCOCIN ) IVPB 1000 mg/200 mL premix  Status:  Discontinued        1,000 mg 200 mL/hr over 60 Minutes Intravenous On call to O.R. 12/05/23 1132 12/05/23 1710   12/03/23 1230  linezolid  (ZYVOX ) IVPB 600 mg  Status:  Discontinued        600 mg 300 mL/hr over 60 Minutes Intravenous Every 12 hours 12/03/23 1204 12/10/23 1107   12/03/23 1100  clindamycin  (CLEOCIN ) IVPB 900 mg  Status:  Discontinued        900 mg 100 mL/hr over 30 Minutes Intravenous Every 8 hours 12/03/23 0619 12/03/23 1204   12/03/23 1000  piperacillin -tazobactam (ZOSYN ) IVPB 3.375 g  Status:  Discontinued        3.375 g 12.5 mL/hr over 240 Minutes Intravenous Every 8 hours 12/03/23 0652 12/09/23 1101   12/03/23 0652  vancomycin  variable dose per unstable renal function (pharmacist dosing)  Status:  Discontinued         Does not apply See admin instructions 12/03/23 0652 12/03/23 1204   12/03/23 0245  piperacillin -tazobactam (ZOSYN ) IVPB 3.375 g        3.375 g 100 mL/hr over 30 Minutes Intravenous  Once 12/03/23 0237 12/03/23 0338   12/03/23 0245  clindamycin  (CLEOCIN ) IVPB 900 mg        900 mg 100 mL/hr over 30 Minutes Intravenous  Once 12/03/23 0237 12/03/23 0338   12/03/23 0245  vancomycin  (VANCOCIN ) 1,750 mg in sodium chloride  0.9 % 500 mL IVPB  Status:  Discontinued        1,750 mg 258.8 mL/hr over 120 Minutes Intravenous  Once 12/03/23 0241 12/03/23 0242   12/03/23 0245  vancomycin  (VANCOREADY) IVPB 1750 mg/350 mL        1,750 mg 175 mL/hr over 120 Minutes Intravenous Once 12/03/23 0243 12/03/23 0548         Component Value Date/Time   SDES TISSUE LEFT LEG 12/06/2023 1621   SPECREQUEST MEDIAL SAMPLE B, PT ON VANC 12/06/2023 1621   CULT  12/06/2023 1621    No growth aerobically or anaerobically. Performed at [facility], [address], [place], [place] [address]    REPTSTATUS 12/11/2023 FINAL 12/06/2023 1621    Procedures: Procedure(s) (LRB): IRRIGATION AND  DEBRIDEMENT WOUND (Left) APPLICATION, WOUND VAC (  Left) Medications reviewed:  Scheduled Meds:  docusate sodium   100 mg Oral BID   enoxaparin  (LOVENOX ) injection  40 mg Subcutaneous Q24H   methadone   40 mg Oral Daily   Continuous Infusions:  sodium chloride      penicillin  G potassium 24 Million Units in dextrose  5 % 500 mL CONTINUOUS infusion 24 Million Units (12/12/23 1118)    Mennie LAMY, MD [facility] 12/13/2023, 9:40 AM

## 2023-12-13 NOTE — Progress Notes (Signed)
 Physical Therapy Treatment Patient Details Name: Kelly [name] MRN: [id number] DOB: 1985-09-22 Today's Date: 12/13/2023   History of Present Illness 38 y.o. female presents to [facility] hospital on 12/03/23 with LUE and LLE redness, pain, and swelling, concern for necrotizing fasciitis. Underwent debridement and wound vac placement 7/22. Underwent fasciotomy and I&D of L hand and forearm 7/24 for same thing. F/U I&D on 7/25, 7/30. PMH:  HTN, systolic CHF with moderate pulmonary hypertension and severe TR, polysubstance abuse including history of IV drug use, recurrent history of endocarditis, C. difficile colitis, MRSA bacteremia, sepsis, bipolar do    PT Comments  Pt resting in bed on arrival with slow progress towards acute goals as pt continues to be limited by increased pain in LLE when in dependant positioning. Pt able to come to sitting EOB with supervision and maintain a few minutes for completion of seated LE exercises for increased ROM and strength maintenance before returning to supine. Educated and encouraged pt to at least be seated up EOB for meals for increased tolerance for dependant positioning of LLE as well as exercises to complete throughout day as tolerated.  Pt declining transfers or gait attempts this session. Pt continues to benefit from skilled PT services to progress toward functional mobility goals.     If plan is discharge home, recommend the following: A little help with bathing/dressing/bathroom;Assistance with cooking/housework;Assist for transportation;A little help with walking and/or transfers   Can travel by private vehicle        Equipment Recommendations  Rolling walker (2 wheels);Wheelchair (measurements PT);Wheelchair cushion (measurements PT)    Recommendations for Other Services       Precautions / Restrictions Precautions Precautions: Fall;Other (comment) Recall of Precautions/Restrictions: Intact Precaution/Restrictions Comments: Wound Vac on  LLE Restrictions Weight Bearing Restrictions Per Provider Order: No Other Position/Activity Restrictions: L UE and L LE WBAT     Mobility  Bed Mobility Overal bed mobility: Needs Assistance Bed Mobility: Supine to Sit, Sit to Supine     Supine to sit: Supervision, HOB elevated Sit to supine: Supervision, HOB elevated   General bed mobility comments: For safety    Transfers Overall transfer level: Needs assistance                 General transfer comment: pt declining transfer    Ambulation/Gait                   Stairs             Wheelchair Mobility     Tilt Bed    Modified Rankin (Stroke Patients Only)       Balance Overall balance assessment: Needs assistance Sitting-balance support: Single extremity supported, No upper extremity supported, Feet supported Sitting balance-Leahy Scale: Good Sitting balance - Comments: Able to sit EOB w/ Supervision w/ no LOB                                    Communication Communication Communication: No apparent difficulties  Cognition Arousal: Alert Behavior During Therapy: Flat affect                             Following commands: Intact      Cueing Cueing Techniques: Verbal cues  Exercises General Exercises - Lower Extremity Long Arc Quad: AROM, Left, 10 reps, Seated Heel Slides: AROM, Left, 10 reps, Supine Straight Leg  Raises: AROM, Left, 10 reps, Supine    General Comments General comments (skin integrity, edema, etc.): VSS on RA      Pertinent Vitals/Pain Pain Assessment Pain Assessment: Faces Faces Pain Scale: Hurts little more Pain Location: LLE Pain Descriptors / Indicators: Burning, Throbbing, Grimacing, Guarding Pain Intervention(s): Monitored during session, Limited activity within patient's tolerance    Home Living                          Prior Function            PT Goals (current goals can now be found in the care plan  section) Acute Rehab PT Goals PT Goal Formulation: With patient Time For Goal Achievement: 12/27/23 Progress towards PT goals: Not progressing toward goals - comment    Frequency    Min 2X/week      PT Plan      Co-evaluation              AM-PAC PT 6 Clicks Mobility   Outcome Measure  Help needed turning from your back to your side while in a flat bed without using bedrails?: A Little Help needed moving from lying on your back to sitting on the side of a flat bed without using bedrails?: A Little Help needed moving to and from a bed to a chair (including a wheelchair)?: A Little Help needed standing up from a chair using your arms (e.g., wheelchair or bedside chair)?: A Little Help needed to walk in hospital room?: A Lot Help needed climbing 3-5 steps with a railing? : Total 6 Click Score: 15    End of Session   Activity Tolerance: Patient limited by pain;Patient limited by fatigue Patient left: in bed;with call bell/phone within reach;with bed alarm set Nurse Communication: Mobility status PT Visit Diagnosis: Pain;Unsteadiness on feet (R26.81);Other abnormalities of gait and mobility (R26.89) Pain - Right/Left: Left Pain - part of body: Leg     Time: 8471-8457 PT Time Calculation (min) (ACUTE ONLY): 14 min  Charges:    $Therapeutic Activity: 8-22 mins PT General Charges $$ ACUTE PT VISIT: 1 Visit                     Iolanda Folson R. PTA Acute Rehabilitation Services Office: [phone]   Therisa CHRISTELLA Boor 12/13/2023, 3:54 PM

## 2023-12-13 NOTE — TOC Progression Note (Addendum)
 Transition of Care [facility]) - Progression Note    Patient Details  Name: Kelly [name] MRN: [id number] Date of Birth: 01-Mar-1986  [facility]) CM/SW Contact  Bridget Cordella Simmonds, LCSW Phone Number: 12/13/2023, 1:23 PM  Clinical Narrative:   CSW spoke with Kelly [name] at [facility] treatment Center/methadone  [phone].  Lisa scheduled pt for intake appt at 530am on Monday, 8/4.  Pt will complete intake, see MD, and receive her first dose on that date.  Kelly requesting that DC summary be faxed to [phone].  Info added to AVS.  Team updated.   1400: CSW updated pt on intake appt at [facility] on Monday morning and she is in agreement with this, will attend this appt.  She does also want outpt PT set up at clinic near the hospital.  [facility] informed and will set up.   Expected Discharge Plan: Home/Self Care Barriers to Discharge: Other (must enter comment), Homeless with medical needs (Needs methadone  follow up)               Expected Discharge Plan and Services In-house Referral: Clinical Social Work     Living arrangements for the past 2 months: Homeless, No permanent address                                       Social Drivers of Health (SDOH) Interventions SDOH Screenings   Food Insecurity: No Food Insecurity (12/03/2023)  Housing: High Risk (12/03/2023)  Transportation Needs: Unmet Transportation Needs (12/03/2023)  Utilities: At Risk (12/03/2023)  Alcohol Screen: Low Risk  (08/03/2020)  Depression (PHQ2-9): Medium Risk (09/26/2020)  Financial Resource Strain: High Risk (08/03/2020)  Tobacco Use: Medium Risk (12/11/2023)    Readmission Risk Interventions     No data to display

## 2023-12-13 NOTE — TOC CAGE-AID Note (Signed)
 Transition of Care [facility]) - CAGE-AID Screening   Patient Details  Name: GELILA WELL MRN: [id number] Date of Birth: 08/11/1985  Transition of Care [facility]) CM/SW Contact:    Bridget Cordella Simmonds, LCSW Phone Number: 12/13/2023, 10:31 AM   Clinical Narrative: CSW met with pt for cage aid.  Pt reports daily use of fentanyl  for months.  Pt denied other drug use, we discussed UDS positive for cocaine, benzo, amphetamine and pt denies regular use of these drugs but does admit that she has taken some substances without know exactly what it is.  Pt is interested in treatment despite reporting no concerns on the cage aid.  Pt appears to have been started on methadone  here in the hospital.  Pt reports she has been in touch with [facility] methadone  cl[facility] [phone] and does want to move forward with their program at [place].  She is aware that she will need daily transportation--she is in agreement to get set up with medicaid transport ASAP and also says her sister will be able to assist if needed.  Resource list of services for all levels of treatment in the area was provided.     CAGE-AID Screening:    Have You Ever Felt You Ought to Cut Down on Your Drinking or Drug Use?: No Have People Annoyed You By Critizing Your Drinking Or Drug Use?: No Have You Felt Bad Or Guilty About Your Drinking Or Drug Use?: No Have You Ever Had a Drink or Used Drugs First Thing In The Morning to Steady Your Nerves or to Get Rid of a Hangover?: No CAGE-AID Score: 0  Substance Abuse Education Offered: Yes  Substance abuse interventions: Other (must comment) Sport and exercise psychologist provided)

## 2023-12-13 NOTE — TOC Initial Note (Signed)
 Transition of Care [facility]) - Initial/Assessment Note    Patient Details  Name: Kelly [name] MRN: [id number] Date of Birth: 1985/12/13  Transition of Care [facility]) CM/SW Contact:    Kelly Dais, LCSW Phone Number: 12/13/2023, 10:13 AM  Clinical Narrative: CSW pt has reported that she has not had stable housing and has been living from place to place. The pt is in the process of working out living arrangements with her sister Kelly [name]. The pt does not have reliable transportation to the Methadone  Clinic. The pt reports that she is aware of medicaid transportation but has not set up the services yet.   The pt gave permission to speak with the sister Kelly [name].    Expected Discharge Plan: Home/Self Care Barriers to Discharge: Other (must enter comment), Homeless with medical needs (Needs methadone  follow up)   Patient Goals and CMS Choice Patient states their goals for this hospitalization and ongoing recovery are:: Getting better          Expected Discharge Plan and Services In-house Referral: Clinical Social Work     Living arrangements for the past 2 months: Homeless, No permanent address                                      Prior Living Arrangements/Services Living arrangements for the past 2 months: Homeless, No permanent address Lives with:: Other (Comment) (homeless) Patient language and need for interpreter reviewed:: Yes Do you feel safe going back to the place where you live?: No   Patient reports she's been living place to place and is working out living with her sister  Need for Family Participation in Patient Care: Yes (Comment)     Criminal Activity/Legal Involvement Pertinent to Current Situation/Hospitalization: No - Comment as needed  Activities of Daily Living   ADL Screening (condition at time of admission) Independently performs ADLs?: Yes (appropriate for developmental age) Is the patient deaf or have difficulty hearing?: No Does the patient  have difficulty seeing, even when wearing glasses/contacts?: No Does the patient have difficulty concentrating, remembering, or making decisions?: No  Permission Sought/Granted Permission sought to share information with : Family Supports Permission granted to share information with : Yes, Verbal Permission Granted  Share Information with NAME: Kelly [name] (sister)           Emotional Assessment Appearance:: Appears older than stated age Attitude/Demeanor/Rapport: Engaged Affect (typically observed): Guarded, Flat Orientation: : Oriented to Self, Oriented to Place, Oriented to  Time, Oriented to Situation Alcohol / Substance Use: Illicit Drugs Psych Involvement: No (comment)  Admission diagnosis:  Cellulitis [L03.90] Cellulitis, unspecified cellulitis site [L03.90] Patient Active Problem List   Diagnosis Date Noted   Cutaneous abscess of left foot 12/11/2023   Abscess of arm, left 12/06/2023   Cellulitis 12/03/2023   Hypokalemia 12/03/2023   Necrotizing fasciitis (HCC) 12/03/2023   Acute osteomyelitis of pelvic region [facility])    Pelvic fluid collection 01/25/2021   Sepsis (HCC) 01/19/2021   Constipation 01/19/2021   Thigh abscess 10/26/2020   Infection of left wrist (HCC) 10/26/2020   Vaginal discharge 10/26/2020   Abscess 10/26/2020   Abscess of left lower leg 10/26/2020   VBAC (vaginal birth after Cesarean) 09/30/2020   Chronic hypertension affecting pregnancy 09/29/2020   [redacted] weeks gestation of pregnancy 09/26/2020   Syphilis affecting pregnancy in third trimester 09/23/2020   C. difficile diarrhea 09/14/2020   Septic pulmonary embolism (HCC) 08/24/2020  Severe sepsis (HCC) 08/24/2020   Acute on chronic systolic CHF (congestive heart failure) (HCC) 08/24/2020   Severe pulmonary hypertension (HCC) 08/24/2020   Endocarditis of tricuspid valve 08/24/2020   Severe tricuspid regurgitation 08/24/2020   Prolonged QT interval 08/24/2020   Pain, dental    Maternal endocarditis  complicating pregnancy in third trimester    Methadone  maintenance treatment affecting pregnancy (HCC) 07/21/2020   Drug use affecting pregnancy in second trimester    Septic embolism (HCC)    Syphilis    Cellulitis of right lower extremity    MRSA bacteremia 07/13/2020   Acute on chronic congestive heart failure (HCC) 07/13/2020   MDD (major depressive disorder), recurrent severe, 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   Oth psychoactive substance abuse w mood disorder (HCC) 06/26/2019   Congenital heart defect 10/07/2018   History of hepatitis C 10/01/2018   Unwanted fertility 09/04/2018   Chronic systolic heart failure (HCC) 08/29/2018   Infection due to Streptococcus mitis group 07/20/2018   History of VBAC 07/16/2018   Endocarditis 07/12/2018   Dental caries 07/03/2018   Phobia of dental procedure 07/03/2018   Preexisting hypertension complicating pregnancy, antepartum 06/29/2018   Supervision of other high risk pregnancies, third trimester 06/27/2018   Rubella non-immune status, antepartum 06/23/2018   Hyponatremia 06/14/2018   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) 06/14/2018   Tricuspid regurgitation 06/14/2018   Difficult intravenous access    IVDU (intravenous drug user)    AKI (acute kidney injury) (HCC)    Bipolar disorder (HCC) 07/31/2016   Major depressive disorder, recurrent episode with anxious distress (HCC) 07/29/2016   Substance abuse (HCC) 05/25/2016   No prenatal care in current pregnancy in third trimester 05/25/2016   Chronic hypertension during pregnancy, antepartum 05/25/2016   History of cesarean delivery affecting pregnancy 05/25/2016   Breast abscess 07/13/2014   Opioid use disorder, severe, dependence (HCC) 08/25/2013   Episodic mood disorder (HCC) 08/25/2013   ADHD (attention deficit hyperactivity disorder) 08/25/2013   Cocaine abuse (HCC) 08/25/2013   Back pain  01/29/2013   Scoliosis 11/17/2012   CROHN'S DISEASE 05/03/2005   COLONIC POLYPS 02/28/2003   PCP:  Pcp, No Pharmacy:   [facility] [phone] - [place], [place] - [address] AT [facility] OF [address] [place] [address] Phone: [phone] Fax: [phone]  [id number] [place], [place] - [address] AT [facility] & [address] [place] [address] Phone: [phone] Fax: [phone]     Social Drivers of Health (SDOH) Social History: SDOH Screenings   Food Insecurity: No Food Insecurity (12/03/2023)  Housing: High Risk (12/03/2023)  Transportation Needs: Unmet Transportation Needs (12/03/2023)  Utilities: At Risk (12/03/2023)  Alcohol Screen: Low Risk  (08/03/2020)  Depression (PHQ2-9): Medium Risk (09/26/2020)  Financial Resource Strain: High Risk (08/03/2020)  Tobacco Use: Medium Risk (12/11/2023)   SDOH Interventions:     Readmission Risk Interventions     No data to display

## 2023-12-13 NOTE — Progress Notes (Signed)
 Patient ID: Kelly [name], female   DOB: 10-Aug-1985, 38 y.o.   MRN: [id number] Patient is status post repeat debridement for necrotizing fasciitis left lower extremity and left upper extremity.  There is Forner 50 cc in the wound VAC canister.  The dressing was leaking.  The proximal aspect of the dressing had been pulled off.  The wound VAC dressing was reinforced with Ioban.  This had a good suction fit.  Patient may discharge on the Prevena plus portable wound VAC pump when safe for discharge from a medical standpoint.

## 2023-12-14 DIAGNOSIS — M726 Necrotizing fasciitis: Secondary | ICD-10-CM | POA: Diagnosis not present

## 2023-12-14 LAB — BASIC METABOLIC PANEL WITH GFR
Anion gap: 10 (ref 5–15)
Anion gap: 13 (ref 5–15)
BUN: 21 mg/dL — ABNORMAL HIGH (ref 6–20)
BUN: 23 mg/dL — ABNORMAL HIGH (ref 6–20)
CO2: 20 mmol/L — ABNORMAL LOW (ref 22–32)
CO2: 25 mmol/L (ref 22–32)
Calcium: 8.6 mg/dL — ABNORMAL LOW (ref 8.9–10.3)
Calcium: 8.8 mg/dL — ABNORMAL LOW (ref 8.9–10.3)
Chloride: 99 mmol/L (ref 98–111)
Chloride: 99 mmol/L (ref 98–111)
Creatinine, Ser: 0.76 mg/dL (ref 0.44–1.00)
Creatinine, Ser: 0.8 mg/dL (ref 0.44–1.00)
GFR, Estimated: >60 mL/min (ref >60)
GFR, Estimated: >60 mL/min (ref >60)
Glucose, Bld: 103 mg/dL — ABNORMAL HIGH (ref 70–99)
Glucose, Bld: 94 mg/dL (ref 70–99)
Potassium: 4.5 mmol/L (ref 3.5–5.1)
Potassium: 5.6 mmol/L — ABNORMAL HIGH (ref 3.5–5.1)
Sodium: 132 mmol/L — ABNORMAL LOW (ref 135–145)
Sodium: 134 mmol/L — ABNORMAL LOW (ref 135–145)

## 2023-12-14 LAB — CBC
HCT: 26.1 % — ABNORMAL LOW (ref 36.0–46.0)
Hemoglobin: 8 g/dL — ABNORMAL LOW (ref 12.0–15.0)
MCH: 27.1 pg (ref 26.0–34.0)
MCHC: 30.7 g/dL (ref 30.0–36.0)
MCV: 88.5 fL (ref 80.0–100.0)
Platelets: 590 K/uL — ABNORMAL HIGH (ref 150–400)
RBC: 2.95 MIL/uL — ABNORMAL LOW (ref 3.87–5.11)
RDW: 17.1 % — ABNORMAL HIGH (ref 11.5–15.5)
WBC: 14.8 K/uL — ABNORMAL HIGH (ref 4.0–10.5)
nRBC: 0 % (ref 0.0–0.2)

## 2023-12-14 MED ORDER — SODIUM ZIRCONIUM CYCLOSILICATE 10 G PO PACK
10.0000 g | PACK | Freq: Once | ORAL | Status: AC
  Administered 2023-12-14: 10 g via ORAL
  Filled 2023-12-14: qty 1

## 2023-12-14 NOTE — Progress Notes (Signed)
 Physical Therapy Treatment Patient Details Name: Kelly [name] MRN: [id number] DOB: 09/02/1985 Today's Date: 12/14/2023   History of Present Illness 38 y.o. female presents to [facility] hospital on 12/03/23 with LUE and LLE redness, pain, and swelling, concern for necrotizing fasciitis. Underwent debridement and wound vac placement 7/22. Underwent fasciotomy and I&D of L hand and forearm 7/24 for same thing. F/U I&D on 7/25, 7/30. PMH:  HTN, systolic CHF with moderate pulmonary hypertension and severe TR, polysubstance abuse including history of IV drug use, recurrent history of endocarditis, C. difficile colitis, MRSA bacteremia, sepsis, bipolar do    PT Comments  Pt demonstrating good progress towards acute goals this session. Pt able to progress ambulation to/from bathroom with RW for support and CGA for safety. Pt continues to be limited by LLE pain, maintaining grossly NWB on L throughout gait with good LE clearance and good use of bil Ues on RW. Encouraged pt to accept more weight to L, as pt WBAT, with pt able to touch toes down intermittently for a few steps. Continued education on importance of mobility as well as exercises to complete throughout day with pt verbalizing understanding. Pt up in chair at end of session with all needs met. Pt continues to benefit from skilled PT services to progress toward functional mobility goals.    Of note; wound vac alarming canister full on arrival and wound dressing leaking at lateral aspect of LL, updated RN during session and into room at end of session.    If plan is discharge home, recommend the following: A little help with bathing/dressing/bathroom;Assistance with cooking/housework;Assist for transportation;A little help with walking and/or transfers   Can travel by private vehicle        Equipment Recommendations  Rolling walker (2 wheels);Wheelchair (measurements PT);Wheelchair cushion (measurements PT)    Recommendations for Other Services        Precautions / Restrictions Precautions Precautions: Fall;Other (comment) Recall of Precautions/Restrictions: Intact Precaution/Restrictions Comments: Wound Vac on LLE Restrictions Weight Bearing Restrictions Per Provider Order: No Other Position/Activity Restrictions: L UE and L LE WBAT     Mobility  Bed Mobility Overal bed mobility: Needs Assistance Bed Mobility: Supine to Sit     Supine to sit: Supervision, HOB elevated     General bed mobility comments: For safety    Transfers Overall transfer level: Needs assistance Equipment used: Rolling walker (2 wheels) Transfers: Sit to/from Stand Sit to Stand: Contact guard assist           General transfer comment: CGA from EOB at lowest height and low commode    Ambulation/Gait Ambulation/Gait assistance: Contact guard assist Gait Distance (Feet): 15 Feet (x2) Assistive device: Rolling walker (2 wheels) Gait Pattern/deviations: Step-to pattern Gait velocity: decr     General Gait Details: pt maintaining grossly NWB on L with hop to pattern with RW, good LLE clearance and no LOB, CGA for safety, encouraged pt to increased weight bearing tolerance with pt able to touch toes down a few times   Stairs             Wheelchair Mobility     Tilt Bed    Modified Rankin (Stroke Patients Only)       Balance Overall balance assessment: Needs assistance Sitting-balance support: Single extremity supported, No upper extremity supported, Feet supported Sitting balance-Leahy Scale: Good Sitting balance - Comments: Able to sit EOB w/ Supervision w/ no LOB   Standing balance support: Single extremity supported, Bilateral upper extremity supported, During functional activity,  Reliant on assistive device for balance Standing balance-Leahy Scale: Fair Standing balance comment: Requires RW to stand and to offload LLE due to pain in leg; able to perform peri care in standing with unilateral UE support and close  Supervision for safety                            Communication Communication Communication: No apparent difficulties  Cognition Arousal: Alert Behavior During Therapy: WFL for tasks assessed/performed   PT - Cognitive impairments: No apparent impairments                         Following commands: Intact      Cueing Cueing Techniques: Verbal cues  Exercises      General Comments General comments (skin integrity, edema, etc.): VSS on RA, wound vac alarming that canister full with leaking at from lateral aspect of LLE, RN updated and aware      Pertinent Vitals/Pain Pain Assessment Pain Assessment: Faces Faces Pain Scale: Hurts even more Pain Location: LLE Pain Descriptors / Indicators: Burning, Throbbing, Grimacing, Guarding Pain Intervention(s): Monitored during session, Limited activity within patient's tolerance, Repositioned    Home Living                          Prior Function            PT Goals (current goals can now be found in the care plan section) Acute Rehab PT Goals Patient Stated Goal: Decrease pain and get better PT Goal Formulation: With patient Time For Goal Achievement: 12/27/23 Progress towards PT goals: Progressing toward goals    Frequency    Min 2X/week      PT Plan      Co-evaluation              AM-PAC PT 6 Clicks Mobility   Outcome Measure  Help needed turning from your back to your side while in a flat bed without using bedrails?: A Little Help needed moving from lying on your back to sitting on the side of a flat bed without using bedrails?: A Little Help needed moving to and from a bed to a chair (including a wheelchair)?: A Little Help needed standing up from a chair using your arms (e.g., wheelchair or bedside chair)?: A Little Help needed to walk in hospital room?: A Lot (<40') Help needed climbing 3-5 steps with a railing? : Total 6 Click Score: 15    End of Session Equipment  Utilized During Treatment: Gait belt Activity Tolerance: Patient tolerated treatment well;Patient limited by pain Patient left: with call bell/phone within reach;in chair;with chair alarm set Nurse Communication: Mobility status;Other (comment) (wound vac alamr) PT Visit Diagnosis: Pain;Unsteadiness on feet (R26.81);Other abnormalities of gait and mobility (R26.89) Pain - Right/Left: Left Pain - part of body: Leg     Time: 8894-8862 PT Time Calculation (min) (ACUTE ONLY): 32 min  Charges:    $Gait Training: 8-22 mins $Therapeutic Activity: 8-22 mins PT General Charges $$ ACUTE PT VISIT: 1 Visit                     Rashiya Lofland R. PTA Acute Rehabilitation Services Office: [phone]   Therisa CHRISTELLA Boor 12/14/2023, 12:35 PM

## 2023-12-14 NOTE — Progress Notes (Signed)
 Progress Note   Patient: Kelly [name] FMW:[id number] DOB: Sep 30, 1985 DOA: 12/03/2023     11 DOS: the patient was seen and examined on 12/14/2023   Brief hospital course: 38 y.o. 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 difficile infection, bipolar disorder, hep C, hypertension, anemia presenting with complaints of rapidly progressive left leg erythema, pain, and swelling x 2 days PTA. In ED found to be septic, CT scan showed concerns of cellulitis of left lower extremity along with necrotizing fasciitis.she was started on broad-spectrum antibiotics and orthopedic consulted  Patient was taken to the OR on 7/22, 7/24, 7/25, 7/30 for excisional debridements with application of wound vacs. Left upper extremity wound VAC was removed on 7/30.  Left lower extremity wound VAC remains in place.  Overall clinically improving and stable.  ID advised switching to amoxicillin  1 g 3 times daily at discharge to cover total 14 days from 7/30   Assessment and Plan:  Severe sepsis POA  Left lower extremity necrotizing fasciitis  Left upper extremity abscess/necrotizing fasciitis IVDA hx: S/P LLE I&D 7/22> Wound vac placed> s/p final debridement on 7/30-application of micro graft and wound VAC S/P LUE I&D 7/25>Wound vac placed> not anticipating further surgery.  Upper extremity wound VAC removed on 7/30.  Infectious disease following.  Input appreciated. Cannot get PICC line due to IVDA/not a candidate for home IV antibiotic therapy. Patient wound growing group a strep > 7/28 changed to PCN Overall clinically improving and stable.  ID advised switching to amoxicillin  1 g 3 times daily at discharge to cover total 14 days from 7/30 Dr. Harden presents for outpatient follow-up wound care and on discharge patient will continue  with Prevena  vac   Suspicious behavior Ongoing substance abuse/IV drug abuse TOC consulted for counseling.  Patient reports she has been snorting and has not recently used IV drugs. Reports being on methadone , now resumed.   TOC consult to set up fu at methadone  clinic. -As patient can only follow-up at the methadone  clinic on 8/4, she will need to be discharged after her methadone  dose on 8/3.   Hep C positive: HIV negative, HCV RNA not detected   AKI with metabolic acidosis resolved.    Anemia of chronic disease: Some drop suspect due to patient's acute illness.  Monitor and transfuse if <7 g fu w/ PCP   Hyponatremia Hypokalemia Hypomagnesemia: Stable   HFmrEF History of severe TR and pulmonary hypertension TTE 01/2021 showedEF 40 to 45%. Currently no signs of volume overload, appears to be compensated.  repeat TTE now > EF 70-75%, dissipate left ventricle with right ventricular pressure overload mildly reduced RV SF severely elevated pulmonary artery systolic pressure severely dilated RA massive tricuspid valve regurgitation with severely dilated TV annulus with known history of TV endocarditis.  Advised to follow-up with outpatient cardiology       Subjective: Patient complains of ongoing pain.  We discussed her current medication regimen which includes methadone , oxycodone  and IV Dilaudid .  I reminded the patient that she will not be able to continue with the oxycodone  and Dilaudid  at discharge.  Physical Exam: Vitals:   12/13/23 2012 12/14/23 0455 12/14/23 0722 12/14/23 1420  BP: 108/73 127/80 96/63 93/66   Pulse: 88 88 85 79  Resp: 17 17 16    Temp: 98.1 F (36.7 C) 97.7 F (36.5 C) 98 F (36.7 C) (!) 97.5 F (  36.4 C)  TempSrc: Oral Oral Oral Oral  SpO2: 100% 99% 98% 95%  Weight:      Height:       Physical Exam   General: Alert, oriented X3  Eyes: Pupils equal, reactive  Oral cavity: moist mucous membranes  Head: Atraumatic, normocephalic  Neck: supple   Chest: clear to auscultation. No crackles, no wheezes  CVS: S1,S2 RRR. No murmurs  Abd: No distention, soft, non-tender. No masses palpable  Extr: Wound VAC left lower extremity, dressing left upper extremity. MSK: No joint deformities or swelling  Neurological: Grossly intact.    Data Reviewed:     Latest Ref Rng & Units 12/13/2023   11:55 PM 12/12/2023    6:38 PM 12/10/2023   10:11 AM  BMP  Glucose 70 - 99 mg/dL 896  889  855   BUN 6 - 20 mg/dL 21  18  12    Creatinine 0.44 - 1.00 mg/dL 9.23  9.19  9.16   Sodium 135 - 145 mmol/L 134  134  130   Potassium 3.5 - 5.1 mmol/L 4.5  4.6  4.5   Chloride 98 - 111 mmol/L 99  99  96   CO2 22 - 32 mmol/L 25  22  25    Calcium  8.9 - 10.3 mg/dL 8.8  8.5  8.7       Latest Ref Rng & Units 12/13/2023   11:55 PM 12/10/2023   10:11 AM 12/09/2023    7:09 AM  CBC  WBC 4.0 - 10.5 K/uL 14.8  16.0  19.0   Hemoglobin 12.0 - 15.0 g/dL 8.0  7.8  7.5   Hematocrit 36.0 - 46.0 % 26.1  24.9  23.2   Platelets 150 - 400 K/uL 590  463  397      Family Communication: n/a  Disposition: Status is: Inpatient   Planned Discharge Destination: Home    Time spent: 35 minutes  Author: MDALA-GAUSI, Xiao Graul AGATHA, MD 12/14/2023 2:26 PM  For on call review [email or web address].

## 2023-12-15 DIAGNOSIS — M726 Necrotizing fasciitis: Secondary | ICD-10-CM | POA: Diagnosis not present

## 2023-12-15 LAB — CBC
HCT: 25.5 % — ABNORMAL LOW (ref 36.0–46.0)
Hemoglobin: 7.7 g/dL — ABNORMAL LOW (ref 12.0–15.0)
MCH: 26.6 pg (ref 26.0–34.0)
MCHC: 30.2 g/dL (ref 30.0–36.0)
MCV: 88.2 fL (ref 80.0–100.0)
Platelets: 596 K/uL — ABNORMAL HIGH (ref 150–400)
RBC: 2.89 MIL/uL — ABNORMAL LOW (ref 3.87–5.11)
RDW: 18 % — ABNORMAL HIGH (ref 11.5–15.5)
WBC: 15.9 K/uL — ABNORMAL HIGH (ref 4.0–10.5)
nRBC: 0 % (ref 0.0–0.2)

## 2023-12-15 LAB — BASIC METABOLIC PANEL WITH GFR
Anion gap: 9 (ref 5–15)
BUN: 23 mg/dL — ABNORMAL HIGH (ref 6–20)
CO2: 25 mmol/L (ref 22–32)
Calcium: 8.8 mg/dL — ABNORMAL LOW (ref 8.9–10.3)
Chloride: 100 mmol/L (ref 98–111)
Creatinine, Ser: 0.75 mg/dL (ref 0.44–1.00)
GFR, Estimated: >60 mL/min (ref >60)
Glucose, Bld: 98 mg/dL (ref 70–99)
Potassium: 4.4 mmol/L (ref 3.5–5.1)
Sodium: 134 mmol/L — ABNORMAL LOW (ref 135–145)

## 2023-12-15 MED ORDER — METHOCARBAMOL 750 MG PO TABS
750.0000 mg | ORAL_TABLET | Freq: Four times a day (QID) | ORAL | 0 refills | Status: AC | PRN
Start: 2023-12-15 — End: ?

## 2023-12-15 MED ORDER — AMOXICILLIN 500 MG PO CAPS
1000.0000 mg | ORAL_CAPSULE | Freq: Three times a day (TID) | ORAL | Status: DC
  Administered 2023-12-15: 1000 mg via ORAL
  Filled 2023-12-15 (×4): qty 2

## 2023-12-15 MED ORDER — ACETAMINOPHEN 325 MG PO TABS: 650.0000 mg | ORAL_TABLET | Freq: Four times a day (QID) | ORAL | Status: AC | PRN

## 2023-12-15 MED ORDER — DOCUSATE SODIUM 100 MG PO CAPS: 100.0000 mg | ORAL_CAPSULE | Freq: Two times a day (BID) | ORAL | 0 refills | Status: AC

## 2023-12-15 MED ORDER — IBUPROFEN 600 MG PO TABS: 600.0000 mg | ORAL_TABLET | Freq: Three times a day (TID) | ORAL | Status: AC

## 2023-12-15 MED ORDER — IBUPROFEN 200 MG PO TABS
600.0000 mg | ORAL_TABLET | Freq: Three times a day (TID) | ORAL | Status: DC
  Administered 2023-12-15 (×2): 600 mg via ORAL
  Filled 2023-12-15 (×2): qty 3

## 2023-12-15 MED ORDER — AMOXICILLIN 500 MG PO CAPS: 1000.0000 mg | ORAL_CAPSULE | Freq: Three times a day (TID) | ORAL | 0 refills | Status: AC

## 2023-12-15 MED ORDER — OXYCODONE HCL 10 MG PO TABS: 10.0000 mg | ORAL_TABLET | ORAL | 0 refills | Status: AC | PRN

## 2023-12-15 NOTE — TOC Transition Note (Addendum)
 Transition of Care [facility]) - Discharge Note   Patient Details  Name: Kelly [name] MRN: [id number] Date of Birth: [birth year]  [facility] CM/SW Contact:  Robynn Eileen Hoose, RN Phone Number: 12/15/2023, 12:07 PM   Clinical Narrative:     Patient is being discharged. DME ordered through Jermaine with [employer] to be delivered to bedside. Patient aware unable to get wheelchair and rolling walker through insurance. Patient decided on rolling walker.   1400: Message from floor nurse regarding patient wanting to discuss her rolling walker vs wheelchair option. Spoke with patient, reports that she changed her mind and now wants the wheelchair instead of the rolling walker. Jermaine with [employer] made aware. Wheelchair to be delivered to bedside.  Final next level of care: Home/Self Care Barriers to Discharge: No Barriers Identified   Patient Goals and CMS Choice Patient states their goals for this hospitalization and ongoing recovery are:: Getting better          Discharge Placement                       Discharge Plan and Services Additional resources added to the After Visit Summary for   In-house Referral: Clinical Social Work              DME Arranged: Vannie rolling, 3-N-1 DME Agency: [employer] Date DME Agency Contacted: 12/15/23 Time DME Agency Contacted: 1129 Representative spoke with at DME Agency: Jermaine (Accepted at 1205)            Social Drivers of Health (SDOH) Interventions SDOH Screenings   Food Insecurity: No Food Insecurity (12/03/2023)  Housing: High Risk (12/03/2023)  Transportation Needs: Unmet Transportation Needs (12/03/2023)  Utilities: At Risk (12/03/2023)  Alcohol Screen: Low Risk  (08/03/2020)  Depression (PHQ2-9): Medium Risk (09/26/2020)  Financial Resource Strain: High Risk (08/03/2020)  Tobacco Use: Medium Risk (12/11/2023)     Readmission Risk Interventions     No data to display

## 2023-12-15 NOTE — Discharge Summary (Signed)
 Physician Discharge Summary   Patient: Kelly [name] MRN: [id number] DOB: Mar 12, 1986  Admit date:     12/03/2023  Discharge date: 12/15/23  Discharge Physician: MDALA-GAUSI, GOLDEN PILLOW   PCP: Pcp, No   Recommendations at discharge:    Follow up with Dr Harden  Discharge Diagnoses: Principal Problem:   Cellulitis Active Problems:   Substance abuse (HCC)   AKI (acute kidney injury) (HCC)   Severe sepsis (HCC)   Abscess of left lower leg   Hypokalemia   Necrotizing fasciitis (HCC)   Abscess of arm, left   Cutaneous abscess of left foot  Resolved Problems:   * No resolved hospital problems. 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 difficile infection, bipolar disorder, hep C, hypertension, anemia presenting with complaints of rapidly progressive left leg erythema, pain, and swelling x 2 days PTA. In ED found to be septic, CT scan showed concerns of cellulitis of left lower extremity along with necrotizing fasciitis.she was started on broad-spectrum antibiotics and orthopedic consulted  Patient was taken to the OR on 7/22, 7/24, 7/25, 7/30 for excisional debridements with application of wound vacs. Left upper extremity wound VAC was removed on 7/30.  Left lower extremity wound VAC remains in place.   Overall clinically improving and stable.  ID advised switching to amoxicillin  1 g 3 times daily at discharge to cover total 14 days from 7/30  The rest of the hospital course is in problem-based format below:    Assessment and Plan:  Severe sepsis, POA  Left lower extremity necrotizing fasciitis  Left upper extremity abscess/necrotizing fasciitis Orthopedic surgery and infectious disease were consulted.   S/p LUE I&D on 7/25, and wound VAC was placed.  Wound VAC was removed on 7/30. S/P LLE  I&D 7/22, with wound VAC placed.  She underwent final debridement on 7/30 with application of micro graft and wound VAC.  Wound cultures grew group A strep. Initially started on broad-spectrum antibiotics and eventually transitioned to penicillin  G.  Patient could not get a PICC line for discharge due to history of IVDU.  She was transitioned to amoxicillin  at discharge, which she is to continue for total of 14 days from 7/30 (end date 12/25/2023) Patient will follow-up with Dr. Harden as outpatient and for wound care.  She is being discharged with a Prevena VAC.     Substance use disorder TOC consulted for counseling.  Patient reports she has been snorting and has not recently used IV drugs. Reports being on methadone  in the past. Methadone  was resumed during her hospital stay. The patient is to follow-up at methadone  clinic on 8/4.  Her appointment has been set up.   Hep C positive: HIV negative, HCV RNA not detected    Anemia of chronic disease: Some drop suspect due to patient's acute illness.  Hemoglobin was stable at 7.5-8.   Hyponatremia Mild hyponatremia. Sodium remained stable.   HFmrEF History of severe TR and pulmonary hypertension TTE 01/2021 showed EF 40 to 45%. Currently no signs of volume overload, appears to be compensated.  Repeat TTE now > EF 70-75%, with severely elevated pulmonary artery systolic pressure.  Advised to follow-up with outpatient cardiology       PDMP was reviewed. Patient was given a prescription for a 3-dose supply of oxycodone  to tide her over until her follow-up in methadone  clinic the  day after discharge.  Consultants: Orthopedic surgery, general surgery, infectious disease Procedures performed: I&D's, VAC placement Disposition: Home Diet recommendation:  Regular diet DISCHARGE MEDICATION: Allergies as of 12/15/2023       Reactions   Hydrocodone  Itching   Morphine  And Codeine Nausea And Vomiting        Medication List     TAKE these  medications    acetaminophen  325 MG tablet Commonly known as: TYLENOL  Take 2 tablets (650 mg total) by mouth every 6 (six) hours as needed for mild pain (pain score 1-3) or fever (or Fever >/= 101).   amoxicillin  500 MG capsule Commonly known as: AMOXIL  Take 2 capsules (1,000 mg total) by mouth 3 (three) times daily for 10 days.   docusate sodium  100 MG capsule Commonly known as: COLACE Take 1 capsule (100 mg total) by mouth 2 (two) times daily.   ibuprofen  600 MG tablet Commonly known as: ADVIL  Take 1 tablet (600 mg total) by mouth 3 (three) times daily.   methocarbamol  750 MG tablet Commonly known as: ROBAXIN  Take 1 tablet (750 mg total) by mouth every 6 (six) hours as needed for muscle spasms.   Oxycodone  HCl 10 MG Tabs Take 1 tablet (10 mg total) by mouth every 4 (four) hours as needed for up to 3 doses for moderate pain (pain score 4-6).               Durable Medical Equipment  (From admission, onward)           Start     Ordered   12/15/23 1119  For home use only DME Walker rolling  Once       Question Answer Comment  Walker: With 5 Inch Wheels   Patient needs a walker to treat with the following condition Necrotizing fasciitis (HCC)      12/15/23 1119   12/15/23 1119  For home use only DME 3 n 1  Once        12/15/23 1119   12/15/23 1119  For home use only DME standard manual wheelchair with seat cushion  Once       Comments: Patient suffers from necrotizing fasciitis which impairs their ability to perform daily activities like bathing, grooming, and toileting in the home.  A walker will not resolve issue with performing activities of daily living. A wheelchair will allow patient to safely perform daily activities. Patient can safely propel the wheelchair in the home or has a caregiver who can provide assistance. Length of need 6 months . Accessories: elevating leg rests (ELRs), wheel locks, extensions and anti-tippers.   12/15/23 1119               Discharge Care Instructions  (From admission, onward)           Start     Ordered   12/15/23 0000  No dressing needed        12/15/23 1407            Follow-up Information     Harden Jerona GAILS, MD Follow up in 1 week(s).   Specialty: Orthopedic Surgery Contact information: [address] [place]  [address] [place] [address] [phone]         [facility]. Go on 12/16/2023.   Why: Please attend your intake appt at 530AM on Monday, 12/16/23.  You will see the MD on this day and receive your first methadone  dose. Contact information: [address] [place], [place] [address]  [facility] Follow up.   Specialty: Rehabilitation Why: Referral made for outpatient PT and OT, please call and arrange appointment time. Contact information: [address] [place]  [address] [phone]               Discharge Exam: Fredricka Weights   12/03/23 9366 12/03/23 1022 12/06/23 1313  Weight: 74.8 kg 80 kg 74.8 kg   General: Alert, oriented X3  Eyes: Pupils equal, reactive  Oral cavity: moist mucous membranes  Head: Atraumatic, normocephalic  Neck: supple  Chest: clear to auscultation. No crackles, no wheezes  CVS: S1,S2 RRR. No murmurs  Abd: No distention, soft, non-tender. No masses palpable  Extr: Wound VAC left lower extremity, dressing left upper extremity. MSK: No joint deformities or swelling  Neurological: Grossly intact.    Condition at discharge: stable  The results of significant diagnostics from this hospitalization (including imaging, microbiology, ancillary and laboratory) are listed below for reference.   Imaging Studies: ECHOCARDIOGRAM COMPLETE Result Date: 12/04/2023    ECHOCARDIOGRAM REPORT   Patient Name:   NONNIE PICKNEY Date of Exam: 12/04/2023 Medical Rec #:  [id number]          Height:       67.0 in Accession #:    [id number]         Weight:       176.4 lb Date of Birth:   03-14-1986           BSA:          1.917 m Patient Age:    38 years           BP:           110/62 mmHg Patient Gender: F                  HR:           106 bpm. Exam Location:  Inpatient Procedure: 2D Echo, Cardiac Doppler and Color Doppler (Both Spectral and Color            Flow Doppler were utilized during procedure). Indications:    Other cardiac sounds R01.2  History:        Patient has prior history of Echocardiogram examinations, most                 recent 01/19/2021. CHF, Arrythmias:Tachycardia; Risk                 Factors:Polysubstance Abuse, Former Smoker and Hypertension.  Sonographer:    Koleen Popper RDCS Referring Phys: [id number] [facility]  Sonographer Comments: Image acquisition challenging due to respiratory motion. IMPRESSIONS  1. Left ventricular ejection fraction, by estimation, is 70 to 75%. The left ventricle has hyperdynamic function. The left ventricle has no regional wall motion abnormalities. Left ventricular diastolic parameters were normal. There is the interventricular septum is flattened in diastole ('D' shaped left ventricle), consistent with right ventricular volume overload and the interventricular septum is flattened in systole, consistent with right ventricular pressure overload.  2. Right ventricular systolic function is mildly reduced. The right ventricular size is severely enlarged. There is severely elevated pulmonary artery systolic pressure.  3. Right atrial size was severely dilated.  4. The mitral valve is normal in structure. No evidence of mitral valve regurgitation. No evidence of mitral stenosis.  5. TV annulus severely dilated with malcoaptation of the leaflets due to known history of TV endocarditis. The tricuspid valve is abnormal. Tricuspid valve regurgitation is  massive.  6. The aortic valve is normal in structure. Aortic valve regurgitation is not visualized. No aortic stenosis is present.  7. The inferior vena cava is normal in size with greater than 50%  respiratory variability, suggesting right atrial pressure of 3 mmHg. FINDINGS  Left Ventricle: Left ventricular ejection fraction, by estimation, is 70 to 75%. The left ventricle has hyperdynamic function. The left ventricle has no regional wall motion abnormalities. The left ventricular internal cavity size was normal in size. There is no left ventricular hypertrophy. The interventricular septum is flattened in diastole ('D' shaped left ventricle), consistent with right ventricular volume overload and the interventricular septum is flattened in systole, consistent with right ventricular pressure overload. Left ventricular diastolic parameters were normal. Right Ventricle: The right ventricular size is severely enlarged. No increase in right ventricular wall thickness. Right ventricular systolic function is mildly reduced. There is severely elevated pulmonary artery systolic pressure. The tricuspid regurgitant velocity is 4.24 m/s, and with an assumed right atrial pressure of 15 mmHg, the estimated right ventricular systolic pressure is 86.9 mmHg. Left Atrium: Left atrial size was normal in size. Right Atrium: Right atrial size was severely dilated. Pericardium: There is no evidence of pericardial effusion. Mitral Valve: The mitral valve is normal in structure. No evidence of mitral valve regurgitation. No evidence of mitral valve stenosis. Tricuspid Valve: TV annulus severely dilated with malcoaptation of the leaflets due to known history of TV endocarditis. The tricuspid valve is abnormal. Tricuspid valve regurgitation is massive. No evidence of tricuspid stenosis. Aortic Valve: The aortic valve is normal in structure. Aortic valve regurgitation is not visualized. No aortic stenosis is present. Pulmonic Valve: The pulmonic valve was normal in structure. Pulmonic valve regurgitation is not visualized. No evidence of pulmonic stenosis. Aorta: The aortic root is normal in size and structure. Venous: The inferior vena  cava is normal in size with greater than 50% respiratory variability, suggesting right atrial pressure of 3 mmHg. IAS/Shunts: The interatrial septum was not well visualized.  LEFT VENTRICLE PLAX 2D LVIDd:         4.70 cm   Diastology LVIDs:         3.00 cm   LV e' medial:    10.40 cm/s LV PW:         0.90 cm   LV E/e' medial:  9.0 LV IVS:        0.90 cm   LV e' lateral:   27.00 cm/s LVOT diam:     2.10 cm   LV E/e' lateral: 3.5 LV SV:         70 LV SV Index:   37 LVOT Area:     3.46 cm  RIGHT VENTRICLE             IVC RV Basal diam:  5.60 cm     IVC diam: 2.30 cm RV Mid diam:    5.30 cm RV S prime:     36.40 cm/s TAPSE (M-mode): 3.6 cm LEFT ATRIUM             Index        RIGHT ATRIUM           Index LA diam:        3.80 cm 1.98 cm/m   RA Area:     45.10 cm LA Vol (A2C):   37.9 ml 19.77 ml/m  RA Volume:   219.00 ml 114.24 ml/m LA Vol (A4C):   35.1 ml 18.31 ml/m LA Biplane  Vol: 37.8 ml 19.72 ml/m  AORTIC VALVE LVOT Vmax:   142.00 cm/s LVOT Vmean:  89.900 cm/s LVOT VTI:    0.203 m  AORTA Ao Root diam: 3.10 cm Ao Asc diam:  2.90 cm MITRAL VALVE               TRICUSPID VALVE MV Area (PHT): 3.99 cm    TR Peak grad:   71.9 mmHg MV Decel Time: 190 msec    TR Vmax:        424.00 cm/s MV E velocity: 93.80 cm/s MV A velocity: 76.00 cm/s  SHUNTS MV E/A ratio:  1.23        Systemic VTI:  0.20 m                            Systemic Diam: 2.10 cm Morene Brownie Electronically signed by Morene Brownie Signature Date/Time: 12/04/2023/12:58:44 PM    Final    CT HUMERUS LEFT WO CONTRAST Result Date: 12/03/2023 CLINICAL DATA:  Left upper extremity swelling, erythema, and pain. Severe sepsis secondary to cellulitis of the left lower extremity. EXAM: CT OF THE UPPER LEFT EXTREMITY WITHOUT CONTRAST TECHNIQUE: Multidetector CT imaging of the upper left extremity (left humerus, forearm, and hand) was performed according to the standard protocol. RADIATION DOSE REDUCTION: This exam was performed according to the departmental  dose-optimization program which includes automated exposure control, adjustment of the mA and/or kV according to patient size and/or use of iterative reconstruction technique. COMPARISON:  None Available. FINDINGS: Bones/Joint/Cartilage No fracture or dislocation. No evidence of acute osteolysis. Normal alignment. No sizable joint effusion. Ligaments Ligaments are suboptimally evaluated by CT. Soft tissue Diffuse circumferential subcutaneous edema and stranding extending from the level of the elbow through the hand. Less pronounced subcutaneous edema and stranding extending anteriorly from the shoulder through the elbow. No loculated fluid collection. No soft tissue gas. No discrete intramuscular collection. Prominent left axillary lymph node measuring up to 10 mm in short axis is likely reactive. IMPRESSION: 1. Diffuse circumferential subcutaneous edema and stranding extending from the level of the elbow through the hand and less pronounced subcutaneous edema and stranding extending anteriorly from the shoulder through the elbow, suspicious for cellulitis. No loculated fluid collection. No soft tissue gas. 2. No acute osseous abnormality. Electronically Signed   By: Harrietta Sherry M.D.   On: 12/03/2023 13:30   CT FOREARM LEFT WO CONTRAST Result Date: 12/03/2023 CLINICAL DATA:  Left upper extremity swelling, erythema, and pain. Severe sepsis secondary to cellulitis of the left lower extremity. EXAM: CT OF THE UPPER LEFT EXTREMITY WITHOUT CONTRAST TECHNIQUE: Multidetector CT imaging of the upper left extremity (left humerus, forearm, and hand) was performed according to the standard protocol. RADIATION DOSE REDUCTION: This exam was performed according to the departmental dose-optimization program which includes automated exposure control, adjustment of the mA and/or kV according to patient size and/or use of iterative reconstruction technique. COMPARISON:  None Available. FINDINGS: Bones/Joint/Cartilage No  fracture or dislocation. No evidence of acute osteolysis. Normal alignment. No sizable joint effusion. Ligaments Ligaments are suboptimally evaluated by CT. Soft tissue Diffuse circumferential subcutaneous edema and stranding extending from the level of the elbow through the hand. Less pronounced subcutaneous edema and stranding extending anteriorly from the shoulder through the elbow. No loculated fluid collection. No soft tissue gas. No discrete intramuscular collection. Prominent left axillary lymph node measuring up to 10 mm in short axis is likely reactive. IMPRESSION: 1. Diffuse circumferential  subcutaneous edema and stranding extending from the level of the elbow through the hand and less pronounced subcutaneous edema and stranding extending anteriorly from the shoulder through the elbow, suspicious for cellulitis. No loculated fluid collection. No soft tissue gas. 2. No acute osseous abnormality. Electronically Signed   By: Harrietta Sherry M.D.   On: 12/03/2023 13:30   CT HAND LEFT WO CONTRAST Result Date: 12/03/2023 CLINICAL DATA:  Left upper extremity swelling, erythema, and pain. Severe sepsis secondary to cellulitis of the left lower extremity. EXAM: CT OF THE UPPER LEFT EXTREMITY WITHOUT CONTRAST TECHNIQUE: Multidetector CT imaging of the upper left extremity (left humerus, forearm, and hand) was performed according to the standard protocol. RADIATION DOSE REDUCTION: This exam was performed according to the departmental dose-optimization program which includes automated exposure control, adjustment of the mA and/or kV according to patient size and/or use of iterative reconstruction technique. COMPARISON:  None Available. FINDINGS: Bones/Joint/Cartilage No fracture or dislocation. No evidence of acute osteolysis. Normal alignment. No sizable joint effusion. Ligaments Ligaments are suboptimally evaluated by CT. Soft tissue Diffuse circumferential subcutaneous edema and stranding extending from the  level of the elbow through the hand. Less pronounced subcutaneous edema and stranding extending anteriorly from the shoulder through the elbow. No loculated fluid collection. No soft tissue gas. No discrete intramuscular collection. Prominent left axillary lymph node measuring up to 10 mm in short axis is likely reactive. IMPRESSION: 1. Diffuse circumferential subcutaneous edema and stranding extending from the level of the elbow through the hand and less pronounced subcutaneous edema and stranding extending anteriorly from the shoulder through the elbow, suspicious for cellulitis. No loculated fluid collection. No soft tissue gas. 2. No acute osseous abnormality. Electronically Signed   By: Harrietta Sherry M.D.   On: 12/03/2023 13:30   US  RENAL Result Date: 12/03/2023 CLINICAL DATA:  Acute kidney injury EXAM: RENAL / URINARY TRACT ULTRASOUND COMPLETE COMPARISON:  CT scan 02/10/2021 FINDINGS: Right Kidney: Renal measurements: 9.0 by 4.0 by 5.0 cm = volume: 94 mL. Limited assessment due to overlying bowel gas. Visualization of the lower pole was particularly limited, and as result renal length may be inaccurate. Echogenicity within normal limits. No mass or hydronephrosis visualized. Left Kidney: Renal measurements: 13.0 by 5.1 by 5.8 cm = volume: 202 mL. Echogenicity within normal limits. No mass or hydronephrosis visualized. Bladder: Distended bladder, volume 734 cc. Other: None. IMPRESSION: 1. Distended bladder, volume 734 cc. 2. No hydronephrosis. 3. Limited assessment of the right kidney due to overlying bowel gas. Visualization of the lower pole was particularly limited, and as result renal length may be inaccurate. Electronically Signed   By: Ryan Salvage M.D.   On: 12/03/2023 13:04   CT FOOT LEFT W CONTRAST Result Date: 12/03/2023 EXAM: CT LEFT FOOT, WITH IV CONTRAST 12/03/2023 03:35:58 AM TECHNIQUE: Axial images were acquired through the left foot with IV contrast. Reformatted images were  reviewed. Automated exposure control, iterative reconstruction, and/or weight based adjustment of the mA/kV was utilized to reduce the radiation dose to as low as reasonably achievable. COMPARISON: None available. CLINICAL HISTORY: Osteomyelitis suspected, foot, xray done; ?nec fasc. 75cc [id number]; Pt reports leg edema and redness and left arm swelling and redness. Started 2 days ago but progressed quickly over last few hours. Endorses IV drug use in thigh but states been 2 months. Recently incarcerated but been out for about a month. Hx of endocarditis. FINDINGS: BONES AND JOINTS: No acute fracture or focal osseous lesion. No dislocation. No cortical destruction to suggest  osteomyelitis. The joint spaces are normal. SOFT TISSUES: Extensive subcutaneous fluid/edema along the dorsal foot. No drainable fluid collection/abscess. No soft tissue gas to suggest necrotizing fasciitis. IMPRESSION: 1. Extensive subcutaneous fluid/edema along the dorsal foot. 2. No drainable fluid collection/abscess. 3. No soft tissue gas to suggest necrotizing fasciitis. Electronically signed by: Pinkie Pebbles MD 12/03/2023 03:49 AM EDT RP Workstation: [id number]   CT TIBIA FIBULA LEFT W CONTRAST Result Date: 12/03/2023 EXAM: CT Left Lower Extremity, With IV Contrast 12/03/2023 03:35:58 AM TECHNIQUE: Axial images were acquired through the left lower extremity with IV contrast. Reformatted images were reviewed. Automated exposure control, iterative reconstruction, and/or weight based adjustment of the mA/kV was utilized to reduce the radiation dose to as low as reasonably achievable. COMPARISON: None available. CLINICAL HISTORY: Soft tissue infection suspected, lower leg, xray done. Pt reports leg edema and redness and left arm swelling and redness. Started 2 days ago but progressed quickly over last few hours. Endorses IV drug use in thigh but states been 2 months. Recently incarcerated but been out for about a month. Hx of  endocarditis. FINDINGS: BONES AND JOINTS: No acute fracture or focal osseous lesion. No dislocation. The joint spaces are normal. SOFT TISSUES: Circumferential subcutaneous stranding/edema, most prominent at the mid/distal calf. No drainable fluid collection/abscess. VASCULATURE: Vessels remain patent. IMPRESSION: 1. Circumferential subcutaneous stranding/edema, suggesting cellulitis. 2. No drainable fluid collection/abscess. Electronically signed by: Pinkie Pebbles MD 12/03/2023 03:46 AM EDT RP Workstation: [id number]   CT FEMUR LEFT W CONTRAST Result Date: 12/03/2023 EXAM: CT OF THE LEFT FEMUR, WITH IV CONTRAST 12/03/2023 03:35:58 AM TECHNIQUE: Axial images were acquired through the left femur with IV contrast. Reformatted images were reviewed. Automated exposure control, iterative reconstruction, and/or weight based adjustment of the mA/kV was utilized to reduce the radiation dose to as low as reasonably achievable. COMPARISON: None available. CLINICAL HISTORY: Soft tissue infection suspected, thigh, xray done. Pt reports leg edema and redness and left arm swelling and redness. Started 2 days ago but progressed quickly over last few hours. Endorses IV drug use in thigh but states been 2 months. Recently incarcerated but been out for about a month. Hx of endocarditis. FINDINGS: BONES: No acute fracture or focal osseous lesion. JOINTS: No dislocation. The joint spaces are normal. SOFT TISSUES: Mild subcutaneous stranding/edema most prominent along the lateral mid/lower thigh. No drainable fluid collection/abscess. 16 mm short axis left inguinal node (image 57), likely reactive. VASCULATURE: Vessels remain patent. IMPRESSION: 1. Mild subcutaneous stranding/edema, suggesting cellulitis. 2. No drainable fluid collection or abscess. Electronically signed by: Pinkie Pebbles MD 12/03/2023 03:45 AM EDT RP Workstation: [id number]   DG Chest Port 1 View Result Date: 12/03/2023 CLINICAL DATA:  Leg edema and redness  with left arm swelling and redness x2 days. EXAM: PORTABLE CHEST 1 VIEW COMPARISON:  January 19, 2021 FINDINGS: The heart size and mediastinal contours are within normal limits. Mild, stable linear scarring is seen along the periphery of the right lung base. No acute infiltrate, pleural effusion or pneumothorax is identified. Stable postoperative changes are noted throughout the thoracolumbar spine. The visualized skeletal structures are otherwise unremarkable. IMPRESSION: No active cardiopulmonary disease. Electronically Signed   By: Suzen Dials M.D.   On: 12/03/2023 03:09    Microbiology: Results for orders placed or performed during the hospital encounter of 12/03/23  Blood Culture (routine x 2)     Status: None   Collection Time: 12/03/23  2:56 AM   Specimen: BLOOD  Result Value Ref Range Status   Specimen Description  BLOOD BLOOD LEFT ARM  Final   Special Requests   Final    BOTTLES DRAWN AEROBIC ONLY Blood Culture results may not be optimal due to an inadequate volume of blood received in culture bottles   Culture   Final    NO GROWTH 5 DAYS Performed at [facility], [address], [place], [place] [address]    Report Status 12/08/2023 FINAL  Final  Blood Culture (routine x 2)     Status: None   Collection Time: 12/03/23  2:58 AM   Specimen: BLOOD  Result Value Ref Range Status   Specimen Description BLOOD BLOOD RIGHT ARM  Final   Special Requests   Final    BOTTLES DRAWN AEROBIC AND ANAEROBIC Blood Culture adequate volume   Culture   Final    NO GROWTH 5 DAYS Performed at [facility], [address] [place], [place] [address]    Report Status 12/08/2023 FINAL  Final  Resp panel by RT-PCR (RSV, Flu A&B, Covid) Anterior Nasal Swab     Status: None   Collection Time: 12/03/23  3:50 AM   Specimen: Anterior Nasal Swab  Result Value Ref Range Status   SARS Coronavirus 2 by RT PCR NEGATIVE NEGATIVE Final   Influenza A by PCR NEGATIVE NEGATIVE Final   Influenza  B by PCR NEGATIVE NEGATIVE Final    Comment: (NOTE) The Xpert Xpress SARS-CoV-2/FLU/RSV plus assay is intended as an aid in the diagnosis of influenza from Nasopharyngeal swab specimens and should not be used as a sole basis for treatment. Nasal washings and aspirates are unacceptable for Xpert Xpress SARS-CoV-2/FLU/RSV testing.  Fact Sheet for Patients: BloggerCourse.com  Fact Sheet for Healthcare Providers: SeriousBroker.it  This test is not yet approved or cleared by the [place]  FDA and has been authorized for detection and/or diagnosis of SARS-CoV-2 by FDA under an Emergency Use Authorization (EUA). This EUA will remain in effect (meaning this test can be used) for the duration of the COVID-19 declaration under Section 564(b)(1) of the Act, 21 U.S.C. section 360bbb-3(b)(1), unless the authorization is terminated or revoked.     Resp Syncytial Virus by PCR NEGATIVE NEGATIVE Final    Comment: (NOTE) Fact Sheet for Patients: BloggerCourse.com  Fact Sheet for Healthcare Providers: SeriousBroker.it  This test is not yet approved or cleared by the [place]  FDA and has been authorized for detection and/or diagnosis of SARS-CoV-2 by FDA under an Emergency Use Authorization (EUA). This EUA will remain in effect (meaning this test can be used) for the duration of the COVID-19 declaration under Section 564(b)(1) of the Act, 21 U.S.C. section 360bbb-3(b)(1), unless the authorization is terminated or revoked.  Performed at [name], [address] [place], [place] [address]   Surgical pcr screen     Status: Abnormal   Collection Time: 12/03/23  6:44 AM   Specimen: Nasal Mucosa; Nasal Swab  Result Value Ref Range Status   MRSA, PCR POSITIVE (A) NEGATIVE Final    Comment: RESULT CALLED TO, READ BACK BY AND VERIFIED WITH: RN HEATHER BOWMAN [phone] [id number] BY J  RAZZAK, MT    Staphylococcus aureus POSITIVE (A) NEGATIVE Final    Comment: RESULT CALLED TO, READ BACK BY AND VERIFIED WITH: RN POWELL SINK [id number] [phone] BY J RAZZAK, MT (NOTE) The Xpert SA Assay (FDA approved for NASAL specimens in patients 11 years of age and older), is one component of a comprehensive surveillance program. It is not intended to diagnose  infection nor to guide or monitor treatment. Performed at [facility], [address], [place], [place] [address]   Aerobic/Anaerobic Culture w Gram Stain (surgical/deep wound)     Status: None   Collection Time: 12/06/23  4:11 PM   Specimen: Soft Tissue, Other  Result Value Ref Range Status   Specimen Description TISSUE LEFT LEG  Final   Special Requests SAMPLE A, PT ON VANC  Final   Gram Stain   Final    FEW WBC PRESENT, PREDOMINANTLY PMN NO ORGANISMS SEEN    Culture   Final    RARE GROUP A STREP (S.PYOGENES) ISOLATED Beta hemolytic streptococci are predictably susceptible to penicillin  and other beta lactams. Susceptibility testing not routinely performed. NO ANAEROBES ISOLATED Performed at [facility], [address], [place], [place] [address]    Report Status 12/11/2023 FINAL  Final  Aerobic/Anaerobic Culture w Gram Stain (surgical/deep wound)     Status: None   Collection Time: 12/06/23  4:21 PM   Specimen: Soft Tissue, Other  Result Value Ref Range Status   Specimen Description TISSUE LEFT LEG  Final   Special Requests MEDIAL SAMPLE B, PT ON VANC  Final   Gram Stain   Final    RARE WBC PRESENT, PREDOMINANTLY PMN NO ORGANISMS SEEN    Culture   Final    No growth aerobically or anaerobically. Performed at [facility], [address] [place], [place] [address]    Report Status 12/11/2023 FINAL  Final  Surgical pcr screen     Status: Abnormal   Collection Time: 12/11/23  6:39 AM   Specimen: Nasal Mucosa; Nasal Swab  Result Value Ref Range Status   MRSA, PCR POSITIVE (A) NEGATIVE Final    Staphylococcus aureus POSITIVE (A) NEGATIVE Final    Comment: RESULTS CALLED TO, READ BACK BY AND VERIFIED WITH RN A. WOMACK ON 12/11/23 AT 0818 BY NM (NOTE) The Xpert SA Assay (FDA approved for NASAL specimens in patients 37 years of age and older), is one component of a comprehensive surveillance program. It is not intended to diagnose infection nor to guide or monitor treatment. Performed at [facility], [address], [place], [place] [address]     Labs: CBC: Recent Labs  Lab 12/09/23 [phone] 12/10/23 1011 12/13/23 2355 12/15/23 0614  WBC 19.0* 16.0* 14.8* 15.9*  HGB 7.5* 7.8* 8.0* 7.7*  HCT 23.2* 24.9* 26.1* 25.5*  MCV 82.9 85.0 88.5 88.2  PLT 397 463* 590* 596*   Basic Metabolic Panel: Recent Labs  Lab 12/09/23 0709 12/10/23 1011 12/12/23 1838 12/13/23 2355 12/14/23 1502 12/15/23 0614  NA 132* 130* 134* 134* 132* 134*  K 4.6 4.5 4.6 4.5 5.6* 4.4  CL 96* 96* 99 99 99 100  CO2 25 25 22 25  20* 25  GLUCOSE 81 144* 110* 103* 94 98  BUN 11 12 18  21* 23* 23*  CREATININE 0.79 0.83 0.80 0.76 0.80 0.75  CALCIUM  8.4* 8.7* 8.5* 8.8* 8.6* 8.8*  MG 1.8 1.9  --   --   --   --    Liver Function Tests: No results for input(s): AST, ALT, ALKPHOS, BILITOT, PROT, ALBUMIN in the last 168 hours. CBG: No results for input(s): GLUCAP in the last 168 hours.  Discharge time spent: greater than 30 minutes.  Signed: MDALA-GAUSI, Khalin Royce AGATHA, MD [facility] 12/15/2023

## 2023-12-15 NOTE — Plan of Care (Signed)
  Problem: Health Behavior/Discharge Planning: Goal: Ability to manage health-related needs will improve Outcome: Progressing   Problem: Clinical Measurements: Goal: Ability to maintain clinical measurements within normal limits will improve Outcome: Progressing   Problem: Nutrition: Goal: Adequate nutrition will be maintained Outcome: Progressing   Problem: Coping: Goal: Level of anxiety will decrease Outcome: Progressing   Problem: Safety: Goal: Ability to remain free from injury will improve Outcome: Progressing

## 2023-12-19 ENCOUNTER — Telehealth: Payer: Self-pay | Admitting: Orthopedic Surgery

## 2023-12-19 ENCOUNTER — Encounter: Payer: MEDICAID | Admitting: Orthopedic Surgery

## 2023-12-19 NOTE — Telephone Encounter (Signed)
 SW pt, got her scheduled for tomorrow morning with Rocky.

## 2023-12-19 NOTE — Telephone Encounter (Signed)
 Pt need to reschedule appt for tomorrow due to transportation. Pt states have a wound vac on and need tomorrow. Please call pt ASAP at [phone]

## 2023-12-20 ENCOUNTER — Ambulatory Visit (INDEPENDENT_AMBULATORY_CARE_PROVIDER_SITE_OTHER): Payer: MEDICAID | Admitting: Surgical

## 2023-12-20 DIAGNOSIS — Z09 Encounter for follow-up examination after completed treatment for conditions other than malignant neoplasm: Secondary | ICD-10-CM

## 2023-12-20 NOTE — Progress Notes (Signed)
 Multiple I&D debridements of the left lower leg and for left upper arm due to necrotizing fasciitis. Last surgery was 12/11/2023. The patient arrived to the office with her wound vac sponge and drape still intact but the vac was disconnected and the patient states that it has been that way since Monday. The foot was fully incased in [place] drape all the way up to her knee. She had cut a hole at the second toe so that it could drain out  She is in a wheelchair with the leg elevated and complaints of pain. The drape and sponge were removed and the patient has good granulating tissue. The entire foot is macerated. Gauze soaked with Vashe solution was applied to the wound bed for 15 minutes. The forearm incision is well approximated with stitches intact. She does have swelling of the hand otherwise there are no signs or symptoms of infection. This is healing well.   The treatment was discussed with the PA and it was decided to apply a compression dressing to the LLE. The wound was covered with adaptic and 4x4 gauze. Additional ABD pads were applied to the plantar aspect of the foot. A four layer profore compression wrap was applied from the base of the toe to the bend of the knee and the patient tolerated application. She was advised to keep the leg elevated higher than her heart, to keep the dressing dry and she will follow up in the office to see Dr. Harden on Monday. Pt voiced understanding and will call with any questions.   Chapman Matteucci, RMA, CWCA

## 2023-12-23 ENCOUNTER — Encounter: Payer: MEDICAID | Admitting: Orthopedic Surgery

## 2023-12-24 ENCOUNTER — Encounter: Payer: MEDICAID | Admitting: Physician Assistant

## 2023-12-24 NOTE — Progress Notes (Deleted)
      Wound Cleaning Technique (Circular Wound) Start at the center of the wound bed using Vashe-saturated gauze, applying a gentle circular motion, and work outward toward the periwound area (the healthy skin surrounding the wound). This technique ensures debris and exudate are removed while preventing contamination of the wound by avoiding reintroduction of bacteria from the surrounding skin.

## 2023-12-25 ENCOUNTER — Ambulatory Visit (INDEPENDENT_AMBULATORY_CARE_PROVIDER_SITE_OTHER): Payer: MEDICAID | Admitting: Physician Assistant

## 2023-12-25 ENCOUNTER — Encounter: Payer: Self-pay | Admitting: Physician Assistant

## 2023-12-25 DIAGNOSIS — M726 Necrotizing fasciitis: Secondary | ICD-10-CM

## 2023-12-25 NOTE — Progress Notes (Signed)
 Office Visit Note   Patient: Kelly [name]           Date of Birth: 01-13-1986           MRN: [id number] Visit Date: 12/25/2023              Requested by: No referring provider defined for this encounter. PCP: Pcp, No  Chief Complaint  Patient presents with   Left Leg - Routine Post Op      HPI: 38 y.o. 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 difficile infection, bipolar disorder, hep C, hypertension, anemia presenting with complaints of rapidly progressive left leg erythema, pain, and swelling.  She developed necrotizing fasciitis.she was started on broad-spectrum antibiotics.  She was taken to the OR multiple times for debridement and wound vac placement.  Left UE became swollen and required debridement as well on 12/06/23.  He last left LE debridement was performed by Dr. Harden on 12/11/23.  She is here today for f/u exam.    She states she has been elevating her legs most of the time at home.    Assessment & Plan: Visit Diagnoses:  1. Necrotizing fasciitis (HCC)     Plan: Vashe dampened gauze, ABD, and compression with ace.  She will elevate when at rest with her feet above her heart.  WBAT as needed.  Maintain left UE sutures.  Encouraged active use of her left UE.    Follow-Up Instructions: Return in about 1 week (around 01/01/2024).   Ortho Exam  Patient is alert, oriented, no adenopathy, well-dressed, normal affect, normal respiratory effort. The left LE muscle tissue is active and she has intact motor.  The muscle appears gray/pale in color.  No purulence or active drainage.  Medial leg sutures are intact without erythema.  Moderate edema in the left LE into the foot.    The left arm has moderate edema, motor intact and easily palpable radial pulse.  The sutures are intact.      Imaging: No results found.          Labs: Lab Results  Component Value Date   HGBA1C 4.6 (L) 07/13/2020   HGBA1C 5.1 06/26/2019   HGBA1C 5.3 09/04/2018   ESRSEDRATE >140 (H) 08/09/2020   ESRSEDRATE 65 (H) 01/09/2018   ESRSEDRATE >140 (H) 12/17/2017   CRP 0.8 02/17/2021   CRP 2.1 (H) 02/10/2021   CRP 7.0 (H) 02/03/2021   LABURIC 5.4 02/07/2021   LABURIC 7.1 **Please note change in reference range.** (H) 06/17/2007   LABURIC 6.1 **Please note change in reference range.** 06/03/2007   REPTSTATUS 12/11/2023 FINAL 12/06/2023   GRAMSTAIN  12/06/2023    RARE WBC PRESENT, PREDOMINANTLY PMN NO ORGANISMS SEEN    CULT  12/06/2023    No growth aerobically or anaerobically. Performed at [facility], [address], [place], [place] [address]    LABORGA METHICILLIN RESISTANT STAPHYLOCOCCUS AUREUS 01/26/2021     Lab Results  Component Value Date   ALBUMIN 2.6 (L) 12/03/2023   ALBUMIN 1.5 (L) 01/26/2021   ALBUMIN 1.8 (L) 01/19/2021    Lab Results  Component Value Date   MG 1.9 12/10/2023   MG 1.8 12/09/2023   MG 1.6 (L) 12/07/2023   No results found for: VD25OH  No results found for: PREALBUMIN    Latest Ref Rng & Units 12/15/2023    6:14  AM 12/13/2023   11:55 PM 12/11/2023    2:45 PM  CBC EXTENDED  WBC 4.0 - 10.5 K/uL 15.9  14.8    RBC 3.87 - 5.11 MIL/uL 2.89  2.95  3.09   Hemoglobin 12.0 - 15.0 g/dL 7.7  8.0    HCT 63.9 - 46.0 % 25.5  26.1    Platelets 150 - 400 K/uL 596  590       There is no height or weight on file to calculate BMI.  Orders:  No orders of the defined types were placed in this encounter.  No orders of the defined types were placed in this encounter.    Procedures: No procedures performed  Clinical Data: No additional findings.  ROS:  All other systems negative, except as noted in the HPI. Review of Systems  Objective: Vital Signs: LMP 11/28/2023   Specialty Comments:  No specialty comments  available.  PMFS History: Patient Active Problem List   Diagnosis Date Noted   Cutaneous abscess of left foot 12/11/2023   Abscess of arm, left 12/06/2023   Cellulitis 12/03/2023   Hypokalemia 12/03/2023   Necrotizing fasciitis (HCC) 12/03/2023   Acute osteomyelitis of pelvic region [facility])    Pelvic fluid collection 01/25/2021   Sepsis (HCC) 01/19/2021   Constipation 01/19/2021   Thigh abscess 10/26/2020   Infection of left wrist (HCC) 10/26/2020   Vaginal discharge 10/26/2020   Abscess 10/26/2020   Abscess of left lower leg 10/26/2020   VBAC (vaginal birth after Cesarean) 09/30/2020   Chronic hypertension affecting pregnancy 09/29/2020   [redacted] weeks gestation of pregnancy 09/26/2020   Syphilis affecting pregnancy in third trimester 09/23/2020   C. difficile diarrhea 09/14/2020   Septic pulmonary embolism (HCC) 08/24/2020   Severe sepsis (HCC) 08/24/2020   Acute on chronic systolic CHF (congestive heart failure) (HCC) 08/24/2020   Severe pulmonary hypertension (HCC) 08/24/2020   Endocarditis of tricuspid valve 08/24/2020   Severe tricuspid regurgitation 08/24/2020   Prolonged QT interval 08/24/2020   Pain, dental    Maternal endocarditis complicating pregnancy in third trimester    Methadone  maintenance treatment affecting pregnancy (HCC) 07/21/2020   Drug use affecting pregnancy in second trimester    Septic embolism (HCC)    Syphilis    Cellulitis of right lower extremity    MRSA bacteremia 07/13/2020   Acute on chronic congestive heart failure (HCC) 07/13/2020   MDD (major depressive disorder), recurrent severe, 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   Oth psychoactive substance abuse w mood disorder (HCC) 06/26/2019   Congenital heart defect 10/07/2018   History of hepatitis C 10/01/2018   Unwanted fertility 09/04/2018   Chronic systolic heart failure (HCC) 08/29/2018   Infection due to Streptococcus mitis group 07/20/2018   History of VBAC 07/16/2018   Endocarditis  07/12/2018   Dental caries 07/03/2018   Phobia of dental procedure 07/03/2018   Preexisting hypertension complicating pregnancy, antepartum 06/29/2018   Supervision of other high risk pregnancies, third trimester 06/27/2018   Rubella non-immune status, antepartum 06/23/2018   Hyponatremia 06/14/2018   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) 06/14/2018   Tricuspid regurgitation 06/14/2018   Difficult intravenous access    IVDU (intravenous drug user)    AKI (acute kidney injury) (HCC)    Bipolar disorder (HCC) 07/31/2016   Major depressive disorder, recurrent episode with anxious distress (HCC) 07/29/2016   Substance abuse (HCC) 05/25/2016   No prenatal care in current pregnancy in third trimester 05/25/2016  Chronic hypertension during pregnancy, antepartum 05/25/2016   History of cesarean delivery affecting pregnancy 05/25/2016   Breast abscess 07/13/2014   Opioid use disorder, severe, dependence (HCC) 08/25/2013   Episodic mood disorder (HCC) 08/25/2013   ADHD (attention deficit hyperactivity disorder) 08/25/2013   Cocaine abuse (HCC) 08/25/2013   Back pain 01/29/2013   Scoliosis 11/17/2012   CROHN'S DISEASE 05/03/2005   COLONIC POLYPS 02/28/2003   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 12/05/2017   Abscess of breast    rt breast   Abscess of right breast 07/13/2014   Acute cystitis without hematuria    ADD (attention deficit disorder)    Back pain    Bacteremia due to Enterococcus 06/15/2018   Bipolar 1 disorder (HCC)    Endocarditis 2020   Foreign body granuloma of soft tissue, NEC, right upper arm    History of hepatitis C 10/01/2018   Hypertension    Infection of left wrist (HCC) 10/26/2020   Influenza B 06/17/2018   Leukocytosis    Multifocal pneumonia 06/14/2018   Polysubstance abuse (HCC)    Pyelonephritis affecting pregnancy 05/24/2016    Sepsis (HCC) 12/02/2017   Suicidal overdose (HCC)    Thigh abscess 10/26/2020   Vaginal discharge 10/26/2020    Family History  Problem Relation Age of Onset   Diabetes Mother    Diabetes Father    Hypertension Father    Heart attack Neg Hx    Hyperlipidemia Neg Hx    Sudden death Neg Hx     Past Surgical History:  Procedure Laterality Date   APPLICATION OF WOUND VAC Left 12/11/2023   Procedure: APPLICATION, WOUND VAC;  Surgeon: Harden Jerona GAILS, MD;  Location: [facility] OR;  Service: Orthopedics;  Laterality: Left;   BACK SURGERY     scoliosis-in care everywhere 08/1999   BUBBLE STUDY  11/01/2020   Procedure: BUBBLE STUDY;  Surgeon: Kate Lonni CROME, MD;  Location: [facility] ENDOSCOPY;  Service: Cardiovascular;;   CESAREAN SECTION     CHOLECYSTECTOMY  March 2015   FASCIOTOMY Left 12/03/2023   Procedure: FASCIOTOMY, INCISION AND DRAINAGE NECROTIZING FASCITIS;  Surgeon: Kendal Franky SQUIBB, MD;  Location: [facility] OR;  Service: Orthopedics;  Laterality: Left;   FOREIGN BODY REMOVAL Right 05/28/2016   Procedure: REMOVAL FOREIGN BODY RIGHT ARM;  Surgeon: Addie Glendia Hacker, MD;  Location: [facility] OR;  Service: Orthopedics;  Laterality: Right;   I & D EXTREMITY Left 10/26/2020   Procedure: IRRIGATION AND DEBRIDEMENT LEFT WRIST;  Surgeon: Shari Easter, MD;  Location: [facility] OR;  Service: Orthopedics;  Laterality: Left;   INCISION AND DRAINAGE ABSCESS Right 11/01/2020   Procedure: INCISION AND DRAINAGE ABSCESS RIGHT THIGH X 2;  Surgeon: Teresa Lonni HERO, MD;  Location: [facility] OR;  Service: General;  Laterality: Right;   INCISION AND DRAINAGE OF WOUND Left 12/05/2023   Procedure: IRRIGATION AND DEBRIDEMENT WOUND;  Surgeon: Lorretta Dess, MD;  Location: [facility] OR;  Service: Plastics;  Laterality: Left;  IRRIGATION AND DEBRIDEMENT LEFT FOREARM AND HAND   INCISION AND DRAINAGE OF WOUND Left 12/06/2023   Procedure: IRRIGATION AND DEBRIDEMENT LEFT LEG;  Surgeon: Harden Jerona GAILS, MD;  Location: [name] OR;  Service: Orthopedics;   Laterality: Left;   INCISION AND DRAINAGE OF WOUND Left 12/11/2023   Procedure: IRRIGATION AND DEBRIDEMENT WOUND;  Surgeon: Harden Jerona GAILS, MD;  Location: [facility] OR;  Service: Orthopedics;  Laterality: Left;  LEFT LEG IRRIGATION & DEBRIDEMENT   IRRIGATION AND DEBRIDEMENT ABSCESS  Right 07/13/2014   Procedure: IRRIGATION AND DEBRIDEMEN TRIGHT BREAST ABSCESS;  Surgeon: Donnice Lima, MD;  Location: [facility] OR;  Service: General;  Laterality: Right;   IRRIGATION AND DEBRIDEMENT SHOULDER Left 12/06/2023   Procedure: IRRIGATION AND DEBRIDEMENT ARM;  Surgeon: Harden Jerona GAILS, MD;  Location: [facility] OR;  Service: Orthopedics;  Laterality: Left;   LUMBAR LAMINECTOMY/DECOMPRESSION MICRODISCECTOMY N/A 12/05/2017   Procedure: LUMBAR LAMINECTOMY FOR EPIDURAL ABSCESS;  Surgeon: Mavis Purchase, MD;  Location: [facility] OR;  Service: Neurosurgery;  Laterality: N/A;   MINOR IRRIGATION AND DEBRIDEMENT OF WOUND Left 10/26/2020   Procedure: MINOR IRRIGATION AND DEBRIDEMENT OF LEFT BREAST AND BILATERAL THIGHS;  Surgeon: Shari Easter, MD;  Location: [facility] OR;  Service: Orthopedics;  Laterality: Left;   MULTIPLE EXTRACTIONS WITH ALVEOLOPLASTY Bilateral 07/03/2018   Procedure: Extraction of tooth #'s 30 and 31 with alveoloplasty;  Surgeon: Cyndee Tanda FALCON, DDS;  Location: [facility] OR;  Service: Oral Surgery;  Laterality: Bilateral;   RADIOLOGY WITH ANESTHESIA N/A 12/05/2017   Procedure: MRI WITH ANESTHESIA OF [facility] AND LUMBAR SPINE WITH AND WITHOUT;  Surgeon: Radiologist, Medication, MD;  Location: [facility] OR;  Service: Radiology;  Laterality: N/A;   RIGHT/LEFT HEART CATH AND CORONARY ANGIOGRAPHY N/A 05/21/2019   Procedure: RIGHT/LEFT HEART CATH AND CORONARY ANGIOGRAPHY;  Surgeon: Cherrie Toribio SAUNDERS, MD;  Location: [facility] INVASIVE CV LAB;  Service: Cardiovascular;  Laterality: N/A;   TEE WITHOUT CARDIOVERSION N/A 05/21/2019   Procedure: TRANSESOPHAGEAL ECHOCARDIOGRAM (TEE);  Surgeon: Cherrie Toribio SAUNDERS, MD;  Location: [facility] ENDOSCOPY;  Service: Cardiovascular;   Laterality: N/A;   TEE WITHOUT CARDIOVERSION N/A 11/01/2020   Procedure: TRANSESOPHAGEAL ECHOCARDIOGRAM (TEE);  Surgeon: Kate Lonni CROME, MD;  Location: [facility] ENDOSCOPY;  Service: Cardiovascular;  Laterality: N/A;   TUBAL LIGATION Bilateral 09/30/2020   Procedure: POST PARTUM TUBAL LIGATION;  Surgeon: Herchel Gloris LABOR, MD;  Location: [facility] LD ORS;  Service: Gynecology;  Laterality: Bilateral;   Social History   Occupational History   Occupation: unemployed  Tobacco Use   Smoking status: Former    Current packs/day: 0.00    Average packs/day: 1 pack/day for 13.0 years (13.0 ttl pk-yrs)    Types: Cigarettes    Start date: 06/13/2005    Quit date: 06/13/2018    Years since quitting: 5.5   Smokeless tobacco: Never  Vaping Use   Vaping status: Never Used  Substance and Sexual Activity   Alcohol use: Not Currently    Comment: one or two drinks weekly   Drug use: Yes    Types: Cocaine, Heroin, IV, Fentanyl     Comment: heroin, crack cocaine, fentanyl  last used today (07/13/20)    Sexual activity: Yes    Birth control/protection: Condom

## 2024-01-02 ENCOUNTER — Encounter: Payer: MEDICAID | Admitting: Physician Assistant

## 2024-01-07 ENCOUNTER — Encounter: Payer: MEDICAID | Admitting: Physician Assistant

## 2024-01-08 ENCOUNTER — Encounter: Payer: MEDICAID | Admitting: Physician Assistant

## 2024-01-14 ENCOUNTER — Telehealth: Payer: Self-pay | Admitting: Orthopedic Surgery

## 2024-01-14 NOTE — Telephone Encounter (Signed)
 Pt has an appt sch fro tomorrow will hold to discuss at time of appt.

## 2024-01-14 NOTE — Telephone Encounter (Signed)
 Patient needs a refill on the muscle laxer and antibiotic.

## 2024-01-15 ENCOUNTER — Encounter: Payer: MEDICAID | Admitting: Physician Assistant

## 2024-03-16 ENCOUNTER — Encounter: Payer: Self-pay | Admitting: Radiology
